# Patient Record
Sex: Female | Born: 1965 | Hispanic: Yes | Smoking: Never smoker
Health system: Southern US, Community
[De-identification: ages and names within clinical notes are randomized; demographics above are authoritative.]

## PROBLEM LIST (undated history)

## (undated) DIAGNOSIS — Z87442 Personal history of urinary calculi: Secondary | ICD-10-CM

## (undated) DIAGNOSIS — E079 Disorder of thyroid, unspecified: Secondary | ICD-10-CM

## (undated) DIAGNOSIS — D649 Anemia, unspecified: Secondary | ICD-10-CM

## (undated) DIAGNOSIS — E039 Hypothyroidism, unspecified: Secondary | ICD-10-CM

---

## 2014-03-06 ENCOUNTER — Ambulatory Visit: Payer: Self-pay

## 2016-03-15 ENCOUNTER — Emergency Department
Admission: EM | Admit: 2016-03-15 | Discharge: 2016-03-15 | Disposition: A | Discharge status: Home / Self Care | Payer: Self-pay | Attending: Student | Admitting: Student

## 2016-03-15 DIAGNOSIS — K029 Dental caries, unspecified: Secondary | ICD-10-CM | POA: Insufficient documentation

## 2016-03-15 MED ORDER — AMOXICILLIN 500 MG PO CAPS
500.0000 mg | ORAL_CAPSULE | Freq: Once | ORAL | Status: AC
Start: 2016-03-15 — End: 2016-03-15
  Administered 2016-03-15: 500 mg via ORAL
  Filled 2016-03-15: qty 1

## 2016-03-15 MED ORDER — AMOXICILLIN 500 MG PO CAPS: 500.0000 mg | ORAL_CAPSULE | Freq: Three times a day (TID) | ORAL | Status: DC

## 2016-03-15 MED ORDER — IBUPROFEN 600 MG PO TABS: 600.0000 mg | ORAL_TABLET | Freq: Three times a day (TID) | ORAL | Status: DC | PRN

## 2016-03-15 MED ORDER — IBUPROFEN 600 MG PO TABS
600.0000 mg | ORAL_TABLET | Freq: Once | ORAL | Status: AC
  Administered 2016-03-15: 600 mg via ORAL
  Filled 2016-03-15: qty 1

## 2016-03-15 MED ORDER — LIDOCAINE VISCOUS 2 % MT SOLN
15.0000 mL | Freq: Once | OROMUCOSAL | Status: AC
  Administered 2016-03-15: 15 mL via OROMUCOSAL
  Filled 2016-03-15: qty 15

## 2016-03-15 MED ORDER — LIDOCAINE VISCOUS 2 % MT SOLN: 5.0000 mL | Freq: Four times a day (QID) | OROMUCOSAL | Status: DC | PRN

## 2016-03-15 MED ORDER — OXYCODONE-ACETAMINOPHEN 5-325 MG PO TABS
ORAL_TABLET | ORAL | Status: AC
  Administered 2016-03-15: 1 via ORAL
  Filled 2016-03-15: qty 1

## 2016-03-15 MED ORDER — TRAMADOL HCL 50 MG PO TABS: 50.0000 mg | ORAL_TABLET | Freq: Four times a day (QID) | ORAL | Status: AC | PRN

## 2016-03-15 MED ORDER — OXYCODONE-ACETAMINOPHEN 5-325 MG PO TABS
1.0000 | ORAL_TABLET | ORAL | Status: DC | PRN
  Administered 2016-03-15: 1 via ORAL

## 2016-03-15 MED ORDER — OXYCODONE-ACETAMINOPHEN 5-325 MG PO TABS
1.0000 | ORAL_TABLET | Freq: Once | ORAL | Status: AC
  Administered 2016-03-15: 1 via ORAL
  Filled 2016-03-15: qty 1

## 2016-03-15 NOTE — Discharge Instructions (Signed)
Follow-up from list dental clinics provided: [place]  [place] [facility] and [facility] [email or web address] [phone])  [facility] [phone])  [place] [phone])  [place] [phone])  [facility] [phone]) This clinic caters to the indigent population and is on a lottery system. Location: [employer] [facility], [place], [place], [place] [facility] Hours: Wednesdays from 6pm - 9pm, patients seen by a lottery system. For dates, call or go to [email or web address] Services: Cleanings, fillings and simple extractions. Payment Options: DENTAL WORK IS FREE OF CHARGE. Bring proof of income or support. Best way to get seen: Arrive at 5:15 pm - this is a lottery, NOT first come/first serve, so arriving earlier will not increase your chances of being seen.     [facility] [phone] Select option 1 for emergencies   Location: [facility], [place], [address] [place] [facility] Hours: No walk-ins accepted - call the day before to schedule an appointment. Check in times are 9:30 am and 1:30 pm. Services: Simple extractions, temporary fillings, pulpectomy/pulp debridement, uncomplicated abscess drainage. Payment Options: PAYMENT IS DUE AT THE TIME OF SERVICE.  Fee is usually $100-200, additional surgical procedures (e.g. abscess drainage) may be extra. Cash, checks, Visa/MasterCard accepted.  Can file Medicaid if patient is covered for dental - patient should call case worker to check. No discount for [facility] patients. Best way to get seen: MUST call the day before and get onto the schedule. Can usually be seen the next 1-2 days. No walk-ins accepted.     [place] [phone]   Location: [place], [place] [facility] Hours: M, W, Th, F 8am or 1:30pm, Tues 9a or 1:30 - first come/first served. Services: Simple extractions, temporary fillings, uncomplicated abscess drainage.  You do not need to be an [facility] resident. Payment Options: PAYMENT IS DUE AT THE TIME OF SERVICE. Dental insurance, otherwise sliding scale - bring proof of income or support. Depending on income and treatment needed, cost is usually $50-200. Best way to get seen: Arrive early as it is first come/first served.     [facility] [phone]   Location: [place] [facility] Hours: Mon-Thu 8a-5p Services: Most basic dental services including extractions and fillings. Payment Options: PAYMENT IS DUE AT THE TIME OF SERVICE. Sliding scale, up to 50% off - bring proof if income or support. Medicaid with dental option accepted. Best way to get seen: Call to schedule an appointment, can usually be seen within 2 weeks OR they will try to see walk-ins - show up at [place] or 2p (you may have to wait).     [facility] [place] RESIDENTS ONLY   Location: [facility], [place] [address] [place], [place] [address] Clinic Hours: By appointment only. Monday - Thursday 8am-5pm, Friday 8am-12pm Services: Cleanings, fillings, extractions. Payment Options: PAYMENT IS DUE AT THE TIME OF SERVICE. Cash, Visa or MasterCard. Sliding scale - $30 minimum per service. Best way to get seen: Come in to office, complete packet and make an appointment - need proof of income or support monies for each household member and proof of [facility] residence. Usually takes about a month to get in.     [facility] [phone]  Location: [address] [place] [facility] Hours: Walk-in Urgent Care Dental Services  are offered Monday-Friday mornings only. The numbers of emergencies accepted daily is limited to the number of providers available. Maximum 15 - Mondays, Wednesdays & Thursdays Maximum 10 - Tuesdays & Fridays Services: You do not need to be a [facility] resident to be seen for a dental emergency. Emergencies are defined as pain, swelling, abnormal bleeding, or dental trauma. Walkins will receive x-rays if needed. NOTE: Dental cleaning is not an emergency. Payment Options: PAYMENT IS DUE AT THE TIME OF SERVICE. Minimum co-pay is $40.00 for uninsured patients. Minimum co-pay is $3.00 for Medicaid with dental coverage. Dental Insurance is accepted and must be presented at time of visit. Medicare does not cover dental. Forms of payment: Cash, credit card, checks. Best way to get seen: If not previously registered with the clinic, walk-in dental registration begins at 7:15 am and is on a first come/first serve basis. If previously registered with the clinic, call to make an appointment.     [facility] [place] ONLY   Location: [address], [place], [place] Clinic Hours: Mon-Thu 10a-2p Services: Extractions only! Payment Options: FREE (donations accepted) - bring proof of income or support Best way to get seen: Call and schedule an appointment OR come at 8am on the 1st Monday of every month (except for holidays) when it is first come/first served.     [facility] [phone]   Location: [place], [place] [facility] Hours: Friday mornings Services, Payment Options, Best way to get seen: Call for info  Caries dentales (Dental Caries) Caries dentales (enfermedad en los dientes) Esta enfermedad puede originar un hueco en los dientes (carie) que puede volverse ms grande y profunda a lo largo del [place]. CUIDADOS EN EL HOGAR  [place] sus dientes y use hilo dental. Hgalo por lo [employer] al da.  Use dentfrico con  flor.  Use enjuague bucal si as se lo indica el dentista o el mdico.  Coma menos alimentos con azcar y almidn. Beba menos bebidas azucaradas.  Evite comer con frecuencia bocadillos con azcar y almidn. Evite beber con frecuencia bebidas azucaradas.  Concurra a los controles y limpiezas regulares con Brewing technologist.  Use suplementos con flor, si as se lo indica el dentista o el mdico.  Permita que le coloquen flor en los dientes si as se lo indica el dentista o el mdico.   Esta informacin no tiene Marine scientist el consejo del mdico. Asegrese de hacerle al mdico cualquier pregunta que tenga.   Document Released: 06/15/2013 Document Revised: 09/15/2014 Elsevier Interactive Patient Education [employer].

## 2016-03-15 NOTE — ED Provider Notes (Signed)
[facility] Emergency Department Provider Note   ____________________________________________  Time seen: Approximately 9:06 PM  I have reviewed the triage vital signs and the nursing notes.   HISTORY  Chief Complaint Facial Pain    HPI Debra [name] is a 50 y.o. female patient complaining of dental pain. Patient state 2 weeks ago she was seen at [facility] and given antibiotics and told to follow-up with dentist. Patient has not follow-up with the dentist as directed. Patient state the pain is worse. Patient is rating the pain as 8/10. Patient described a pain as "sharp". No other palliative measures for this complaint.   No past medical history on file.  There are no active problems to display for this patient.   No past surgical history on file.  Current Outpatient Rx  Name  Route  Sig  Dispense  Refill  . amoxicillin (AMOXIL) 500 MG capsule   Oral   Take 1 capsule (500 mg total) by mouth 3 (three) times daily.   30 capsule   0   . ibuprofen (ADVIL,MOTRIN) 600 MG tablet   Oral   Take 1 tablet (600 mg total) by mouth every 8 (eight) hours as needed.   15 tablet   0   . lidocaine (XYLOCAINE) 2 % solution   Mouth/Throat   Use as directed 5 mLs in the mouth or throat every 6 (six) hours as needed for mouth pain.   100 mL   0   . traMADol (ULTRAM) 50 MG tablet   Oral   Take 1 tablet (50 mg total) by mouth every 6 (six) hours as needed.   20 tablet   0     Allergies Review of patient's allergies indicates no known allergies.  No family history on file.  Social History Social History  Substance Use Topics  . Smoking status: Not on file  . Smokeless tobacco: Not on file  . Alcohol Use: Not on file    Review of Systems Constitutional: No fever/chills Eyes: No visual changes. ENT: No sore throat.Dental pain Cardiovascular: Denies chest pain. Respiratory: Denies shortness of breath. Gastrointestinal: No abdominal  pain.  No nausea, no vomiting.  No diarrhea.  No constipation. Genitourinary: Negative for dysuria. Musculoskeletal: Negative for back pain. Skin: Negative for rash. Neurological: Negative for headaches, focal weakness or numbness.    ____________________________________________   PHYSICAL EXAM:  VITAL SIGNS: ED Triage Vitals  Enc Vitals Group     BP 03/15/16 2025 113/90 mmHg     Pulse Rate 03/15/16 2025 72     Resp 03/15/16 2025 18     Temp 03/15/16 2025 97.7 F (36.5 C)     Temp Source 03/15/16 2025 Oral     SpO2 03/15/16 2025 100 %     Weight 03/15/16 2025 215 lb (97.523 kg)     Height 03/15/16 2025 5\' 1"  (1.549 m)     Head Cir --      Peak Flow --      Pain Score 03/15/16 2027 8     Pain Loc --      Pain Edu? --      Excl. in [place]? --     Constitutional: Alert and oriented. Well appearing and in no acute distress. Eyes: Conjunctivae are normal. PERRL. EOMI. Head: Atraumatic. Nose: No congestion/rhinnorhea. Mouth/Throat: Mucous membranes are moist.  Oropharynx non-erythematous.Multiple devitalized teeth with edematous and erythematous gingiva. Neck: No stridor.  No cervical spine tenderness to palpation. Hematological/Lymphatic/Immunilogical: No cervical  lymphadenopathy. Cardiovascular: Normal rate, regular rhythm. Grossly normal heart sounds.  Good peripheral circulation. Respiratory: Normal respiratory effort.  No retractions. Lungs CTAB. Gastrointestinal: Soft and nontender. No distention. No abdominal bruits. No CVA tenderness. Musculoskeletal: No lower extremity tenderness nor edema.  No joint effusions. Neurologic:  Normal speech and language. No gross focal neurologic deficits are appreciated. No gait instability. Skin:  Skin is warm, dry and intact. No rash noted. Psychiatric: Mood and affect are normal. Speech and behavior are normal.  ____________________________________________   LABS (all labs ordered are listed, but only abnormal results are  displayed)  Labs Reviewed - No data to display ____________________________________________  EKG   ____________________________________________  RADIOLOGY   ____________________________________________   PROCEDURES  Procedure(s) performed: None  Procedures  Critical Care performed: No  ____________________________________________   INITIAL IMPRESSION / ASSESSMENT AND PLAN / ED COURSE  Pertinent labs & imaging results that were available during my care of the patient were reviewed by me and considered in my medical decision making (see chart for details).  Dental pain secondary to devitalized teeth and gingivitis. Advised patient definitive care must be provided by dentist. Patient given discharge care instructions. Patient given a prescription for amoxicillin, ibuprofen, tramadol, viscous lidocaine. ____________________________________________   FINAL CLINICAL IMPRESSION(S) / ED DIAGNOSES  Final diagnoses:  Pain due to dental caries      NEW MEDICATIONS STARTED DURING THIS VISIT:  New Prescriptions   AMOXICILLIN (AMOXIL) 500 MG CAPSULE    Take 1 capsule (500 mg total) by mouth 3 (three) times daily.   IBUPROFEN (ADVIL,MOTRIN) 600 MG TABLET    Take 1 tablet (600 mg total) by mouth every 8 (eight) hours as needed.   LIDOCAINE (XYLOCAINE) 2 % SOLUTION    Use as directed 5 mLs in the mouth or throat every 6 (six) hours as needed for mouth pain.   TRAMADOL (ULTRAM) 50 MG TABLET    Take 1 tablet (50 mg total) by mouth every 6 (six) hours as needed.     Note:  This document was prepared using Dragon voice recognition software and may include unintentional dictation errors.    Sable Feil, PA-C 03/15/16 2115  Joanne Gavel, MD 03/17/16 0000

## 2016-03-15 NOTE — ED Notes (Signed)
Pt ambulatory to triage with no difficulty. Via [facility] interpreter Debra [name] pt reports pain to her right side of face that radiates up the side of her face. Pt reports she has had this pain intermittently for some time and pt reports she was seen at [facility] about 2 weeks ago and was put on a medication for infection and told to follow up with a dentist if the pain did not improve. Today the pain is worse. Pt tearful during triage.

## 2016-07-21 ENCOUNTER — Emergency Department
Admission: EM | Admit: 2016-07-21 | Discharge: 2016-07-21 | Disposition: A | Discharge status: Home / Self Care | Payer: Self-pay | Attending: Emergency Medicine | Admitting: Emergency Medicine

## 2016-07-21 DIAGNOSIS — Z79899 Other long term (current) drug therapy: Secondary | ICD-10-CM | POA: Insufficient documentation

## 2016-07-21 DIAGNOSIS — L0291 Cutaneous abscess, unspecified: Secondary | ICD-10-CM

## 2016-07-21 DIAGNOSIS — L02414 Cutaneous abscess of left upper limb: Secondary | ICD-10-CM | POA: Insufficient documentation

## 2016-07-21 MED ORDER — SULFAMETHOXAZOLE-TRIMETHOPRIM 800-160 MG PO TABS: 1.0000 | ORAL_TABLET | Freq: Two times a day (BID) | ORAL | 0 refills | Status: DC

## 2016-07-21 MED ORDER — HYDROCODONE-ACETAMINOPHEN 5-325 MG PO TABS: 1.0000 | ORAL_TABLET | ORAL | 0 refills | Status: DC | PRN

## 2016-07-21 NOTE — ED Triage Notes (Signed)
Pt presents to ED with reports of rash on back for four days. Pt reports rash itches.

## 2016-07-21 NOTE — Discharge Instructions (Signed)
Follow-up with your primary care doctor if any continued problems. Begin taking antibiotics as directed. Pain medication and is to be taken as needed for pain. Beware that you  cannot drive while taking this medication as we have already discussed.

## 2016-07-21 NOTE — ED Provider Notes (Signed)
[facility] Emergency Department Provider Note   ____________________________________________   First MD Initiated Contact with Patient 07/21/16 1149     (approximate)  I have reviewed the triage vital signs and the nursing notes.   HISTORY  Chief Complaint Rash Per interpreter   HPI Debra [name] is a 50 y.o. female is here with family with complaint of "rash" on her back. Patient states that it initially started out as an itching place that she was scratching she said now it is more painful. She denies any fever or chills. She has had these symptoms for the last 4 days. Patient is unsure whether she was bitten by a insect. At this time pain is 7 out of 10.  No past medical history on file.  There are no active problems to display for this patient.   No past surgical history on file.  Prior to Admission medications   Medication Sig Start Date End Date Taking? Authorizing Provider  amoxicillin (AMOXIL) 500 MG capsule Take 1 capsule (500 mg total) by mouth 3 (three) times daily. 03/15/16   Sable Feil, PA-C  HYDROcodone-acetaminophen (NORCO/VICODIN) 5-325 MG tablet Take 1 tablet by mouth every 4 (four) hours as needed for moderate pain. 07/21/16   Johnn Hai, PA-C  ibuprofen (ADVIL,MOTRIN) 600 MG tablet Take 1 tablet (600 mg total) by mouth every 8 (eight) hours as needed. 03/15/16   Sable Feil, PA-C  lidocaine (XYLOCAINE) 2 % solution Use as directed 5 mLs in the mouth or throat every 6 (six) hours as needed for mouth pain. 03/15/16   Sable Feil, PA-C  sulfamethoxazole-trimethoprim (BACTRIM DS,SEPTRA DS) 800-160 MG tablet Take 1 tablet by mouth 2 (two) times daily. 07/21/16   Johnn Hai, PA-C  traMADol (ULTRAM) 50 MG tablet Take 1 tablet (50 mg total) by mouth every 6 (six) hours as needed. 03/15/16 03/15/17  Sable Feil, PA-C    Allergies Patient has no known allergies.  No family history on file.  Social History Social  History  Substance Use Topics  . Smoking status: Not on file  . Smokeless tobacco: Not on file  . Alcohol use Not on file    Review of Systems Constitutional: No fever/chills Cardiovascular: Denies chest pain. Respiratory: Denies shortness of breath. Gastrointestinal:   No nausea, no vomiting.  Musculoskeletal: Negative for back pain. Skin: Positive for red area left shoulder. Neurological: Negative for headaches, focal weakness or numbness.  10-point ROS otherwise negative.  ____________________________________________   PHYSICAL EXAM:  VITAL SIGNS: ED Triage Vitals  Enc Vitals Group     BP 07/21/16 1130 134/71     Pulse Rate 07/21/16 1130 76     Resp 07/21/16 1130 18     Temp 07/21/16 1130 97.6 F (36.4 C)     Temp Source 07/21/16 1130 Oral     SpO2 07/21/16 1130 100 %     Weight 07/21/16 1131 250 lb (113.4 kg)     Height 07/21/16 1131 5\' 2"  (1.575 m)     Head Circumference --      Peak Flow --      Pain Score 07/21/16 1131 7     Pain Loc --      Pain Edu? --      Excl. in [place]? --     Constitutional: Alert and oriented. Well appearing and in no acute distress. Eyes: Conjunctivae are normal. PERRL. EOMI. Head: Atraumatic. Nose: No congestion/rhinnorhea. Neck: No stridor.  Cardiovascular: Normal rate, regular rhythm. Grossly normal heart sounds.  Good peripheral circulation. Respiratory: Normal respiratory effort.  No retractions. Lungs CTAB. Musculoskeletal: Moves upper and lower extremities without any difficulty. Normal gait was noted. Neurologic:  Normal speech and language. No gross focal neurologic deficits are appreciated. No gait instability. Skin:  Left posterior shoulder there is a 4 cm area that is erythematous and warm. There is moderate tenderness to touch. Area is firm and no fluctuance was noted. No drainage from this area. Psychiatric: Mood and affect are normal. Speech and behavior are normal.  ____________________________________________     LABS (all labs ordered are listed, but only abnormal results are displayed)  Labs Reviewed - No data to display   PROCEDURES  Procedure(s) performed: None  Procedures  Critical Care performed: No  ____________________________________________   INITIAL IMPRESSION / ASSESSMENT AND PLAN / ED COURSE  Pertinent labs & imaging results that were available during my care of the patient were reviewed by me and considered in my medical decision making (see chart for details).    Clinical Course    Interpreter was called and we discussed the the abscess to the patient and what she should be doing. Patient was given a prescription for Bactrim DS twice a day for 10 days and Norco as needed for pain. Patient is to use warm compresses to the area frequently. She will follow-up with her primary care doctor if any continued problems or return to the emergency room should the area become soft and mushy like we discussed for incision and drainage.  ____________________________________________   FINAL CLINICAL IMPRESSION(S) / ED DIAGNOSES  Final diagnoses:  Abscess      NEW MEDICATIONS STARTED DURING THIS VISIT:  Discharge Medication List as of 07/21/2016 12:20 PM    START taking these medications   Details  HYDROcodone-acetaminophen (NORCO/VICODIN) 5-325 MG tablet Take 1 tablet by mouth every 4 (four) hours as needed for moderate pain., Starting Mon 07/21/2016, Print    sulfamethoxazole-trimethoprim (BACTRIM DS,SEPTRA DS) 800-160 MG tablet Take 1 tablet by mouth 2 (two) times daily., Starting Mon 07/21/2016, Print         Note:  This document was prepared using Dragon voice recognition software and may include unintentional dictation errors.    Johnn Hai, PA-C 07/21/16 [place], MD 07/22/16 2216

## 2017-09-30 ENCOUNTER — Ambulatory Visit: Payer: Self-pay | Attending: Oncology

## 2018-07-28 ENCOUNTER — Other Ambulatory Visit: Payer: Self-pay

## 2018-07-28 ENCOUNTER — Encounter (INDEPENDENT_AMBULATORY_CARE_PROVIDER_SITE_OTHER): Payer: Self-pay

## 2018-07-28 ENCOUNTER — Ambulatory Visit: Payer: Self-pay | Attending: Oncology | Admitting: *Deleted

## 2018-07-28 ENCOUNTER — Encounter: Payer: Self-pay | Admitting: *Deleted

## 2018-07-28 VITALS — BP 113/78 | HR 59 | Temp 98.3°F | Ht 61.0 in | Wt 218.0 lb

## 2018-07-28 DIAGNOSIS — Z0000 Encounter for general adult medical examination without abnormal findings: Secondary | ICD-10-CM

## 2018-07-28 DIAGNOSIS — N63 Unspecified lump in unspecified breast: Secondary | ICD-10-CM

## 2018-07-28 NOTE — Patient Instructions (Signed)
Gave patient hand-out, Women Staying Healthy, Active and Well from [facility], with education on breast health, pap smears, heart and colon health. 

## 2018-07-28 NOTE — Progress Notes (Signed)
  Subjective:     Patient ID: Debra [name], female   DOB: Jan 18, 1966, 52 y.o.   MRN: [id number]  HPI   Review of Systems     Objective:   Physical Exam  Pulmonary/Chest: Right breast exhibits skin change. Right breast exhibits no inverted nipple, no mass, no nipple discharge and no tenderness. Left breast exhibits no inverted nipple, no mass, no nipple discharge, no skin change and no tenderness.  Right breast larger than the left    Abdominal: There is no splenomegaly or hepatomegaly.  Genitourinary: There is no rash, tenderness, lesion or injury on the right labia. There is no rash, tenderness, lesion or injury on the left labia. Cervix exhibits no motion tenderness, no discharge and no friability. Right adnexum displays no mass, no tenderness and no fullness. No erythema, tenderness or bleeding in the vagina. No foreign body in the vagina. No signs of injury around the vagina. No vaginal discharge found.    Skin:     Patient with looks like vitiligo over her entire body       Assessment:     52 year old Hispanic female returns to [facility] for annual screening.  Debra [name], the interpreter present during the interview and exam.  Patient is a very poor historian and getting info regarding her history was very difficult.  On clinical breast exam patient has an approximate 3 cm area of redness with a center superficial nodule.  Patient states it has been present for over a year.  States she also has a place on her back.  There is an approximate 1 cm red flat superficial nodule at the upper left back.  Patient states she went to the ED for this nodule because it was painful, but has never had the breast area of redness evaluated.  Patient has what looks like vitiligo over her entire body.  Taught self breast awareness.  Specimen collected for pap smear without difficulty.  Patient has been screened for eligibility.  She does not have any insurance, Medicare or Medicaid.  She also meets  financial eligibility.  Hand-out given on the Affordable Care Act.  Risk Assessment    Risk Scores      07/28/2018   Last edited by: [name], RN   5-year risk: 1 %   Lifetime risk: 7.3 %            Plan:      Will go ahead and get bilateral diagnostic mammogram and ultrasound, although I feel the area of redness is more skin related.  I have encouraged the patient to follow-up with primary care in regards to the red area on her breast if no findings on imaging. Specimen for pap sent to the lab.  Will follow-up per [facility] protocol.

## 2018-07-31 LAB — PAP LB AND HPV HIGH-RISK: HPV, high-risk: NEGATIVE

## 2018-08-17 ENCOUNTER — Encounter: Payer: Self-pay | Admitting: *Deleted

## 2018-08-17 NOTE — Progress Notes (Signed)
Inbasket message sent to Jeanella Anton to schedule the patient's mammogram.  She did not get to the breast center in time on the day of her appointment.

## 2018-09-10 ENCOUNTER — Ambulatory Visit
Admission: RE | Admit: 2018-09-10 | Discharge: 2018-09-10 | Disposition: A | Discharge status: Home / Self Care | Payer: Self-pay | Source: Ambulatory Visit | Attending: Oncology | Admitting: Oncology

## 2018-09-10 DIAGNOSIS — N63 Unspecified lump in unspecified breast: Secondary | ICD-10-CM | POA: Insufficient documentation

## 2018-09-13 ENCOUNTER — Encounter: Payer: Self-pay | Admitting: *Deleted

## 2018-09-13 NOTE — Progress Notes (Signed)
Patient with benign findings on imaging.  She was encouraged by radiology to follow up with a dermatology consult.  Next mammogram in 1 year.  Pap smear was negative / negative.  Next pap in 5 years.  Letter mailed to inform patient of her results.  HSIS to [place].

## 2019-11-20 ENCOUNTER — Emergency Department: Payer: Self-pay

## 2019-11-20 ENCOUNTER — Other Ambulatory Visit: Payer: Self-pay

## 2019-11-20 ENCOUNTER — Inpatient Hospital Stay
Admission: EM | Admit: 2019-11-20 | Discharge: 2019-11-22 | DRG: 388 | Disposition: A | Discharge status: Home / Self Care | Payer: Self-pay | Attending: Internal Medicine | Admitting: Internal Medicine

## 2019-11-20 ENCOUNTER — Encounter: Payer: Self-pay | Admitting: Emergency Medicine

## 2019-11-20 DIAGNOSIS — D509 Iron deficiency anemia, unspecified: Secondary | ICD-10-CM | POA: Diagnosis present

## 2019-11-20 DIAGNOSIS — K56699 Other intestinal obstruction unspecified as to partial versus complete obstruction: Principal | ICD-10-CM | POA: Diagnosis present

## 2019-11-20 DIAGNOSIS — Z791 Long term (current) use of non-steroidal anti-inflammatories (NSAID): Secondary | ICD-10-CM

## 2019-11-20 DIAGNOSIS — K317 Polyp of stomach and duodenum: Secondary | ICD-10-CM | POA: Diagnosis present

## 2019-11-20 DIAGNOSIS — Z79890 Hormone replacement therapy (postmenopausal): Secondary | ICD-10-CM

## 2019-11-20 DIAGNOSIS — K644 Residual hemorrhoidal skin tags: Secondary | ICD-10-CM | POA: Diagnosis present

## 2019-11-20 DIAGNOSIS — K6389 Other specified diseases of intestine: Secondary | ICD-10-CM

## 2019-11-20 DIAGNOSIS — U071 COVID-19: Secondary | ICD-10-CM | POA: Diagnosis present

## 2019-11-20 DIAGNOSIS — D649 Anemia, unspecified: Secondary | ICD-10-CM | POA: Diagnosis present

## 2019-11-20 DIAGNOSIS — Z6841 Body Mass Index (BMI) 40.0 and over, adult: Secondary | ICD-10-CM

## 2019-11-20 DIAGNOSIS — D500 Iron deficiency anemia secondary to blood loss (chronic): Secondary | ICD-10-CM | POA: Diagnosis present

## 2019-11-20 DIAGNOSIS — Z79899 Other long term (current) drug therapy: Secondary | ICD-10-CM

## 2019-11-20 DIAGNOSIS — Z20822 Contact with and (suspected) exposure to covid-19: Secondary | ICD-10-CM | POA: Diagnosis present

## 2019-11-20 DIAGNOSIS — E039 Hypothyroidism, unspecified: Secondary | ICD-10-CM | POA: Diagnosis present

## 2019-11-20 DIAGNOSIS — K922 Gastrointestinal hemorrhage, unspecified: Secondary | ICD-10-CM | POA: Diagnosis present

## 2019-11-20 HISTORY — DX: Disorder of thyroid, unspecified: E07.9

## 2019-11-20 HISTORY — DX: Hypothyroidism, unspecified: E03.9

## 2019-11-20 LAB — IRON AND TIBC
Iron: 13 ug/dL — ABNORMAL LOW (ref 28–170)
Saturation Ratios: 4 % — ABNORMAL LOW (ref 10.4–31.8)
TIBC: 374 ug/dL (ref 250–450)
UIBC: 361 ug/dL

## 2019-11-20 LAB — COMPREHENSIVE METABOLIC PANEL
ALT: 15 U/L (ref 0–44)
AST: 27 U/L (ref 15–41)
Albumin: 3.5 g/dL (ref 3.5–5.0)
Alkaline Phosphatase: 69 U/L (ref 38–126)
Anion gap: 8 (ref 5–15)
BUN: 9 mg/dL (ref 6–20)
CO2: 20 mmol/L — ABNORMAL LOW (ref 22–32)
Calcium: 7.7 mg/dL — ABNORMAL LOW (ref 8.9–10.3)
Chloride: 103 mmol/L (ref 98–111)
Creatinine, Ser: 0.59 mg/dL (ref 0.44–1.00)
GFR calc Af Amer: >60 mL/min (ref >60)
GFR calc non Af Amer: >60 mL/min (ref >60)
Glucose, Bld: 156 mg/dL — ABNORMAL HIGH (ref 70–99)
Potassium: 3.5 mmol/L (ref 3.5–5.1)
Sodium: 131 mmol/L — ABNORMAL LOW (ref 135–145)
Total Bilirubin: 0.5 mg/dL (ref 0.3–1.2)
Total Protein: 6.5 g/dL (ref 6.5–8.1)

## 2019-11-20 LAB — CBC WITH DIFFERENTIAL/PLATELET
Abs Immature Granulocytes: 0.01 10*3/uL (ref 0.00–0.07)
Basophils Absolute: 0 10*3/uL (ref 0.0–0.1)
Basophils Relative: 0 %
Eosinophils Absolute: 0 10*3/uL (ref 0.0–0.5)
Eosinophils Relative: 0 %
HCT: 24.1 % — ABNORMAL LOW (ref 36.0–46.0)
Hemoglobin: 6.8 g/dL — ABNORMAL LOW (ref 12.0–15.0)
Immature Granulocytes: 0 %
Lymphocytes Relative: 24 %
Lymphs Abs: 1 10*3/uL (ref 0.7–4.0)
MCH: 17.3 pg — ABNORMAL LOW (ref 26.0–34.0)
MCHC: 28.2 g/dL — ABNORMAL LOW (ref 30.0–36.0)
MCV: 61.2 fL — ABNORMAL LOW (ref 80.0–100.0)
Monocytes Absolute: 0.4 10*3/uL (ref 0.1–1.0)
Monocytes Relative: 10 %
Neutro Abs: 2.7 10*3/uL (ref 1.7–7.7)
Neutrophils Relative %: 66 %
Platelets: 197 10*3/uL (ref 150–400)
RBC: 3.94 MIL/uL (ref 3.87–5.11)
RDW: 19.9 % — ABNORMAL HIGH (ref 11.5–15.5)
WBC: 4 10*3/uL (ref 4.0–10.5)
nRBC: 0 % (ref 0.0–0.2)

## 2019-11-20 LAB — CBC
HCT: 23.6 % — ABNORMAL LOW (ref 36.0–46.0)
HCT: 28.7 % — ABNORMAL LOW (ref 36.0–46.0)
Hemoglobin: 6.6 g/dL — ABNORMAL LOW (ref 12.0–15.0)
Hemoglobin: 8.2 g/dL — ABNORMAL LOW (ref 12.0–15.0)
MCH: 17.3 pg — ABNORMAL LOW (ref 26.0–34.0)
MCH: 18.8 pg — ABNORMAL LOW (ref 26.0–34.0)
MCHC: 28 g/dL — ABNORMAL LOW (ref 30.0–36.0)
MCHC: 28.6 g/dL — ABNORMAL LOW (ref 30.0–36.0)
MCV: 61.9 fL — ABNORMAL LOW (ref 80.0–100.0)
MCV: 65.7 fL — ABNORMAL LOW (ref 80.0–100.0)
Platelets: 184 10*3/uL (ref 150–400)
Platelets: 155 10*3/uL (ref 150–400)
RBC: 3.81 MIL/uL — ABNORMAL LOW (ref 3.87–5.11)
RBC: 4.37 MIL/uL (ref 3.87–5.11)
RDW: 19.6 % — ABNORMAL HIGH (ref 11.5–15.5)
RDW: 23 % — ABNORMAL HIGH (ref 11.5–15.5)
WBC: 3.6 10*3/uL — ABNORMAL LOW (ref 4.0–10.5)
WBC: 3.2 10*3/uL — ABNORMAL LOW (ref 4.0–10.5)
nRBC: 0 % (ref 0.0–0.2)
nRBC: 0 % (ref 0.0–0.2)

## 2019-11-20 LAB — URINALYSIS, ROUTINE W REFLEX MICROSCOPIC
Bacteria, UA: NONE SEEN
Bilirubin Urine: NEGATIVE
Glucose, UA: NEGATIVE mg/dL
Hgb urine dipstick: NEGATIVE
Ketones, ur: NEGATIVE mg/dL
Leukocytes,Ua: NEGATIVE
Nitrite: NEGATIVE
Protein, ur: 30 mg/dL — AB
Specific Gravity, Urine: 1.012 (ref 1.005–1.030)
pH: 7 (ref 5.0–8.0)

## 2019-11-20 LAB — PROTIME-INR
INR: 1.2 (ref 0.8–1.2)
Prothrombin Time: 15.4 seconds — ABNORMAL HIGH (ref 11.4–15.2)

## 2019-11-20 LAB — ABO/RH: ABO/RH(D): O POS

## 2019-11-20 LAB — PREPARE RBC (CROSSMATCH)

## 2019-11-20 LAB — RETICULOCYTES
Immature Retic Fract: 6.7 % (ref 2.3–15.9)
RBC.: 3.49 MIL/uL — ABNORMAL LOW (ref 3.87–5.11)
Retic Count, Absolute: 27.6 10*3/uL (ref 19.0–186.0)
Retic Ct Pct: 0.8 % (ref 0.4–3.1)

## 2019-11-20 LAB — APTT: aPTT: 33 seconds (ref 24–36)

## 2019-11-20 LAB — MAGNESIUM: Magnesium: 2 mg/dL (ref 1.7–2.4)

## 2019-11-20 LAB — FERRITIN: Ferritin: 6 ng/mL — ABNORMAL LOW (ref 11–307)

## 2019-11-20 LAB — POC SARS CORONAVIRUS 2 AG: SARS Coronavirus 2 Ag: POSITIVE — AB

## 2019-11-20 MED ORDER — SODIUM CHLORIDE 0.9 % IV SOLN
510.0000 mg | Freq: Once | INTRAVENOUS | Status: AC
  Administered 2019-11-20: 510 mg via INTRAVENOUS
  Filled 2019-11-20: qty 17

## 2019-11-20 MED ORDER — ONDANSETRON HCL 4 MG/2ML IJ SOLN: 4.0000 mg | Freq: Four times a day (QID) | INTRAMUSCULAR | Status: DC | PRN

## 2019-11-20 MED ORDER — ONDANSETRON HCL 4 MG/2ML IJ SOLN
4.0000 mg | Freq: Once | INTRAMUSCULAR | Status: AC
  Administered 2019-11-20: 09:00:00 4 mg via INTRAVENOUS
  Filled 2019-11-20: qty 2

## 2019-11-20 MED ORDER — SODIUM CHLORIDE 0.9 % IV SOLN: INTRAVENOUS | Status: DC

## 2019-11-20 MED ORDER — ACETAMINOPHEN 650 MG RE SUPP: 650.0000 mg | Freq: Four times a day (QID) | RECTAL | Status: DC | PRN

## 2019-11-20 MED ORDER — LEVOTHYROXINE SODIUM 50 MCG PO TABS
150.0000 ug | ORAL_TABLET | Freq: Every day | ORAL | Status: DC
  Administered 2019-11-21 – 2019-11-22 (×2): 150 ug via ORAL
  Filled 2019-11-20 (×2): qty 1

## 2019-11-20 MED ORDER — PEG 3350-KCL-NA BICARB-NACL 420 G PO SOLR
4000.0000 mL | Freq: Once | ORAL | Status: AC
  Administered 2019-11-21: 01:00:00 4000 mL via ORAL
  Filled 2019-11-20: qty 4000

## 2019-11-20 MED ORDER — SENNOSIDES-DOCUSATE SODIUM 8.6-50 MG PO TABS: 1.0000 | ORAL_TABLET | Freq: Every evening | ORAL | Status: DC | PRN

## 2019-11-20 MED ORDER — SODIUM CHLORIDE 0.9 % IV BOLUS
1000.0000 mL | Freq: Once | INTRAVENOUS | Status: AC
  Administered 2019-11-20: 1000 mL via INTRAVENOUS

## 2019-11-20 MED ORDER — ONDANSETRON HCL 4 MG/2ML IJ SOLN: 4.0000 mg | Freq: Three times a day (TID) | INTRAMUSCULAR | Status: DC | PRN

## 2019-11-20 MED ORDER — SODIUM CHLORIDE 0.9 % IV SOLN
10.0000 mL/h | Freq: Once | INTRAVENOUS | Status: AC
  Administered 2019-11-20: 10 mL/h via INTRAVENOUS

## 2019-11-20 MED ORDER — ACETAMINOPHEN 325 MG PO TABS: 650.0000 mg | ORAL_TABLET | Freq: Four times a day (QID) | ORAL | Status: DC | PRN

## 2019-11-20 MED ORDER — SODIUM CHLORIDE 0.9 % IV SOLN
8.0000 mg/h | INTRAVENOUS | Status: DC
  Administered 2019-11-20 – 2019-11-22 (×3): 8 mg/h via INTRAVENOUS
  Filled 2019-11-20 (×3): qty 80

## 2019-11-20 MED ORDER — FERROUS SULFATE 325 (65 FE) MG PO TABS
325.0000 mg | ORAL_TABLET | Freq: Two times a day (BID) | ORAL | Status: DC
  Administered 2019-11-22: 325 mg via ORAL
  Filled 2019-11-20 (×4): qty 1

## 2019-11-20 MED ORDER — PANTOPRAZOLE SODIUM 40 MG IV SOLR: 40.0000 mg | Freq: Two times a day (BID) | INTRAVENOUS | Status: DC

## 2019-11-20 MED ORDER — SODIUM CHLORIDE 0.9 % IV SOLN
80.0000 mg | Freq: Once | INTRAVENOUS | Status: AC
  Administered 2019-11-20: 80 mg via INTRAVENOUS
  Filled 2019-11-20: qty 80

## 2019-11-20 MED ORDER — ONDANSETRON HCL 4 MG PO TABS: 4.0000 mg | ORAL_TABLET | Freq: Four times a day (QID) | ORAL | Status: DC | PRN

## 2019-11-20 MED ORDER — ASCORBIC ACID 500 MG PO TABS
500.0000 mg | ORAL_TABLET | Freq: Every day | ORAL | Status: DC
  Administered 2019-11-22: 500 mg via ORAL
  Filled 2019-11-20 (×2): qty 1

## 2019-11-20 MED ORDER — ZINC SULFATE 220 (50 ZN) MG PO CAPS
220.0000 mg | ORAL_CAPSULE | Freq: Every day | ORAL | Status: DC
  Administered 2019-11-22: 08:00:00 220 mg via ORAL
  Filled 2019-11-20 (×2): qty 1

## 2019-11-20 NOTE — ED Notes (Signed)
X-ray at bedside

## 2019-11-20 NOTE — ED Notes (Signed)
Admitting MD at bedside.

## 2019-11-20 NOTE — ED Notes (Signed)
Pt ambulatory to toilet with steady gait noted.  

## 2019-11-20 NOTE — ED Provider Notes (Signed)
[facility] Emergency Department Provider Note  ____________________________________________   First MD Initiated Contact with Patient 11/20/19 [phone]     (approximate)  I have reviewed the triage vital signs and the nursing notes.   HISTORY  Chief Complaint Chills, Headache, Fever, and Weakness    HPI Debra [name] is a 54 y.o. female with thyroid disease who comes in with not feeling well for the past 2 days.  Patient states that she developed chills and stated she felt like she was about to pass out but went straight some water and felt better.  She never lost consciousness.  Never hit her head.  She states that she just been feeling unwell.  Last took ibuprofen around 4:30 AM with some relief in symptoms, nothing seems to make it worse, constant, onset 2 days ago.  Patient endorses some mild headache but no neck stiffness, some generalized weakness, feeling nausea but no vomiting.  Some occasional abdominal tenderness but none currently.  She denies any chest pain or shortness of breath or coughing.  States that she has lost the sense of taste.  She has been around anybody with coronavirus but states that she has never had it before.  Licensed Spanish interpreter was used for history and physical and any updates for patient          Past Medical History:  Diagnosis Date  . Thyroid disease     There are no problems to display for this patient.   History reviewed. No pertinent surgical history.  Prior to Admission medications   Medication Sig Start Date End Date Taking? Authorizing Provider  Bismuth Subsalicylate (PEPTO-BISMOL PO) Take by mouth as needed.   Yes [provider]  ibuprofen (ADVIL) 200 MG tablet Take 200 mg by mouth every 6 (six) hours as needed.   Yes [provider]  levothyroxine (SYNTHROID) 150 MCG tablet Take 150 mcg by mouth daily before breakfast.   Yes [provider]    Allergies Patient  has no known allergies.  No family history on file.  Social History Social History   Tobacco Use  . Smoking status: Never Smoker  . Smokeless tobacco: Never Used  Substance Use Topics  . Alcohol use: Not on file  . Drug use: Not on file      Review of Systems Constitutional: Positive chills and fevers, body pain Eyes: No visual changes. ENT: No sore throat. Cardiovascular: Denies chest pain. Respiratory: Denies shortness of breath. Gastrointestinal: Positive occasional abdominal pain, nausea no diarrhea.  No constipation. Genitourinary: Negative for dysuria. Musculoskeletal: Negative for back pain. Skin: Negative for rash. Neurological: Positive headache, no focal weakness or numbness. All other ROS negative ____________________________________________   PHYSICAL EXAM:  VITAL SIGNS: ED Triage Vitals  Enc Vitals Group     BP 11/20/19 0828 (!) 132/46     Pulse Rate 11/20/19 0828 87     Resp 11/20/19 0828 18     Temp 11/20/19 0828 98.7 F (37.1 C)     Temp Source 11/20/19 0828 Oral     SpO2 11/20/19 0828 99 %     Weight 11/20/19 0830 215 lb (97.5 kg)     Height 11/20/19 0830 5' (1.524 m)     Head Circumference --      Peak Flow --      Pain Score 11/20/19 0830 9     Pain Loc --      Pain Edu? --      Excl. in  GC? --     Constitutional: Alert and oriented. Well appearing and in no acute distress. Eyes: Conjunctivae are normal. EOMI. Head: Atraumatic. Nose: No congestion/rhinnorhea. Mouth/Throat: Mucous membranes are moist.   Neck: No stridor. Trachea Midline. FROM Cardiovascular: Normal rate, regular rhythm. Grossly normal heart sounds.  Good peripheral circulation. Respiratory: Normal respiratory effort.  No retractions. Lungs CTAB. Gastrointestinal: Soft and nontender. No distention. No abdominal bruits.  Musculoskeletal: No lower extremity tenderness nor edema.  No joint effusions. Neurologic:  Normal speech and language. No gross focal neurologic  deficits are appreciated.  Skin:  Skin is warm, dry and intact. No rash noted. Psychiatric: Mood and affect are normal. Speech and behavior are normal. GU: Brown stool Hemoccult positive  ____________________________________________   LABS (all labs ordered are listed, but only abnormal results are displayed)  Labs Reviewed  CBC WITH DIFFERENTIAL/PLATELET - Abnormal; Notable for the following components:      Result Value   Hemoglobin 6.8 (*)    HCT 24.1 (*)    MCV 61.2 (*)    MCH 17.3 (*)    MCHC 28.2 (*)    RDW 19.9 (*)    All other components within normal limits  COMPREHENSIVE METABOLIC PANEL - Abnormal; Notable for the following components:   Sodium 131 (*)    CO2 20 (*)    Glucose, Bld 156 (*)    Calcium 7.7 (*)    All other components within normal limits  URINALYSIS, ROUTINE W REFLEX MICROSCOPIC - Abnormal; Notable for the following components:   Color, Urine YELLOW (*)    APPearance CLEAR (*)    Protein, ur 30 (*)    All other components within normal limits  POC SARS CORONAVIRUS 2 AG - Abnormal; Notable for the following components:   SARS Coronavirus 2 Ag POSITIVE (*)    All other components within normal limits  MAGNESIUM  IRON AND TIBC  PROTIME-INR  APTT  FERRITIN  RETICULOCYTES  POC SARS CORONAVIRUS 2 AG -  ED  TYPE AND SCREEN  PREPARE RBC (CROSSMATCH)  ABO/RH   ____________________________________________   ED ECG REPORT I, Vanessa Dupuyer, the attending physician, personally viewed and interpreted this ECG.  EKG is normal sinus rate of 82, no ST elevation, T wave inversion in lead III with Q-wave in lead III, normal intervals ____________________________________________  RADIOLOGY Robert Bellow, personally viewed and evaluated these images (plain radiographs) as part of my medical decision making, as well as reviewing the written report by the radiologist.  ED MD interpretation: No pneumonia  Official radiology report(s): DG Chest Portable 1  View  Result Date: 11/20/2019 CLINICAL DATA:  Cough EXAM: PORTABLE CHEST 1 VIEW COMPARISON:  None. FINDINGS: Mild enlargement of the cardiopericardial silhouette. Normal mediastinal contour. No pneumothorax. No pleural effusion. Cephalization of the pulmonary vasculature without overt pulmonary edema. No consolidative airspace disease. IMPRESSION: Mild enlargement of the cardiopericardial silhouette. Cephalization of the pulmonary vasculature without overt pulmonary edema. No consolidative airspace disease. Electronically Signed   By: Ilona Sorrel M.D.   On: 11/20/2019 09:06    ____________________________________________   PROCEDURES  Procedure(s) performed (including Critical Care):  .Critical Care Performed by: Vanessa Strandburg, MD Authorized by: Vanessa Montcalm, MD   Critical care provider statement:    Critical care time (minutes):  45   Critical care was necessary to treat or prevent imminent or life-threatening deterioration of the following conditions:  Circulatory failure   Critical care was time spent personally by me on  the following activities:  Discussions with consultants, evaluation of patient's response to treatment, examination of patient, ordering and performing treatments and interventions, ordering and review of laboratory studies, ordering and review of radiographic studies, pulse oximetry, re-evaluation of patient's condition, obtaining history from patient or surrogate and review of old charts     ____________________________________________   INITIAL IMPRESSION / ASSESSMENT AND PLAN / ED COURSE  Debra [name] was evaluated in Emergency Department on 11/20/2019 for the symptoms described in the history of present illness. She was evaluated in the context of the global COVID-19 pandemic, which necessitated consideration that the patient might be at risk for infection with the SARS-CoV-2 virus that causes COVID-19. Institutional protocols and algorithms that pertain  to the evaluation of patients at risk for COVID-19 are in a state of rapid change based on information released by regulatory bodies including the CDC and federal and state organizations. These policies and algorithms were followed during the patient's care in the ED.    Patient is a 54 year old who comes in with multiple symptoms including chills, fever, headache, whole body weakness, feeling like she is about to pass out, decreased appetite, occasional abdominal pain with nausea.  This sounds most concerning for coronavirus.  Will get labs to evaluate for electrolyte abnormalities, AKI, UTI.  Will get Covid swab.  Will get chest x-ray although patient does not feel short of breath or having any chest pain.  Patient has full range of motion of neck does not look meningitic.  Her abdomen is soft and nontender at this time of low suspicion for acute abdominal pathology.  Patient was noted to have a hemoglobin of 6.8.  I reviewed patient's prior records and on 11/02/2018 patient's hemoglobin was 13.4 on care everywhere.   Discussed with patient she said no history of low blood levels.  Patient does report intermittently seen some bright red blood per rectum.  My rectal exam patient has brown stool but was Hemoccult positive.  She denies any NSAIDs, aspirin, ibuprofen.  She denies any alcohol use or liver issues.  She denies ever having a colonoscopy done before.  Patient is willing to consent to blood.  We will add on iron studies given MCV was low given that could also be contributing.  Will transfuse 1 unit of blood.  Patient was coronavirus positive.  We will discussed the hospital team for admission.   ____________________________________________   FINAL CLINICAL IMPRESSION(S) / ED DIAGNOSES   Final diagnoses:  Symptomatic anemia  COVID-19 virus detected      MEDICATIONS GIVEN DURING THIS VISIT:  Medications  0.9 %  sodium chloride infusion (has no administration in time range)  sodium  chloride 0.9 % bolus 1,000 mL (1,000 mLs Intravenous New Bag/Given 11/20/19 0909)  ondansetron (ZOFRAN) injection 4 mg (4 mg Intravenous Given 11/20/19 [id number])     ED Discharge Orders    None       Note:  This document was prepared using Dragon voice recognition software and may include unintentional dictation errors.   Vanessa Wilson, MD 11/20/19 [phone]

## 2019-11-20 NOTE — ED Notes (Signed)
Pt ambulatory to toilet with steady gait. RBC unit just finished. Line flushed.

## 2019-11-20 NOTE — ED Notes (Signed)
Pt ambulatory to bathroom at this time.

## 2019-11-20 NOTE — ED Notes (Signed)
transferring pt to room 126 via stretcher

## 2019-11-20 NOTE — Consult Note (Signed)
Lucilla Lame, MD [facility]  [address], [place], [place] [address] Phone: [phone] Fax : [phone]  Consultation  Referring Provider:     Dr. Blaine Hamper Primary Care Physician:  [name], MD Primary Gastroenterologist: Althia Forts         Reason for Consultation:     Profound anemia  Date of Admission:  11/20/2019 Date of Consultation:  11/20/2019         HPI:   Debra [name] is a 54 y.o. female who was admitted with chills headache fever and weakness and was found to be COVID-19 positive.  The patient does not speak English and the history is obtained through a Optometrist.  The patient had reported that she developed chills and feeling like she was in a pass out and had taken ibuprofen with some relief.  The patient does report that she had some black stools a few days ago but then reports it resolved.  The patient denies any family history of colon cancer or colon polyps. The patient denies ever having an upper endoscopy or colonoscopy in the past.  She also denies ever seeing a gastroenterologist.  There is no previous history of GI bleeding in the past and she denies any alcohol use or abuse.  Past Medical History:  Diagnosis Date  . Thyroid disease     History reviewed. No pertinent surgical history.  Prior to Admission medications   Medication Sig Start Date End Date Taking? Authorizing Provider  Bismuth Subsalicylate (PEPTO-BISMOL PO) Take by mouth as needed.   Yes [provider]  ibuprofen (ADVIL) 200 MG tablet Take 200 mg by mouth every 6 (six) hours as needed.   Yes [provider]  levothyroxine (SYNTHROID) 150 MCG tablet Take 150 mcg by mouth daily before breakfast.   Yes [provider]    No family history on file.   Social History   Tobacco Use  . Smoking status: Never Smoker  . Smokeless tobacco: Never Used  Substance Use Topics  . Alcohol use: Not on file  . Drug use: Not on file    Allergies as of 11/20/2019    . (No Known Allergies)    Review of Systems:    All systems reviewed and negative except where noted in HPI.   Physical Exam:  Vital signs in last 24 hours: Temp:  [98.5 F (36.9 C)-98.7 F (37.1 C)] 98.5 F (36.9 C) (03/14 1150) Pulse Rate:  [57-87] 63 (03/14 1330) Resp:  [18-23] 18 (03/14 1330) BP: (93-132)/(46-64) 93/58 (03/14 1330) SpO2:  [95 %-100 %] 98 % (03/14 1330) Weight:  [97.5 kg] 97.5 kg (03/14 0830)   General:   Pleasant, cooperative in NAD Head:  Normocephalic and atraumatic. Eyes:   No icterus.   Conjunctiva pink. PERRLA. Ears:  Normal auditory acuity. Neck:  Supple; no masses or thyroidomegaly Lungs: Respirations even and unlabored. Lungs clear to auscultation bilaterally.   No wheezes, crackles, or rhonchi.  Heart:  Regular rate and rhythm;  Without murmur, clicks, rubs or gallops Abdomen:  Soft, nondistended, nontender. Normal bowel sounds. No appreciable masses or hepatomegaly.  No rebound or guarding.  Rectal:  Not performed. Msk:  Symmetrical without gross deformities.    Extremities:  Without edema, cyanosis or clubbing. Neurologic:  Alert and oriented x3;  grossly normal neurologically. Skin:  Intact without significant lesions or rashes. Cervical Nodes:  No significant cervical adenopathy. Psych:  Alert and cooperative. Normal affect.  LAB RESULTS: Recent Labs  11/20/19 0859 11/20/19 1143  WBC 4.0 3.6*  HGB 6.8* 6.6*  HCT 24.1* 23.6*  PLT 197 184   BMET Recent Labs    11/20/19 0859  NA 131*  K 3.5  CL 103  CO2 20*  GLUCOSE 156*  BUN 9  CREATININE 0.59  CALCIUM 7.7*   LFT Recent Labs    11/20/19 0859  PROT 6.5  ALBUMIN 3.5  AST 27  ALT 15  ALKPHOS 69  BILITOT 0.5   PT/INR Recent Labs    11/20/19 0941  LABPROT 15.4*  INR 1.2    STUDIES: DG Chest Portable 1 View  Result Date: 11/20/2019 CLINICAL DATA:  Cough EXAM: PORTABLE CHEST 1 VIEW COMPARISON:  None. FINDINGS: Mild enlargement of the cardiopericardial  silhouette. Normal mediastinal contour. No pneumothorax. No pleural effusion. Cephalization of the pulmonary vasculature without overt pulmonary edema. No consolidative airspace disease. IMPRESSION: Mild enlargement of the cardiopericardial silhouette. Cephalization of the pulmonary vasculature without overt pulmonary edema. No consolidative airspace disease. Electronically Signed   By: Ilona Sorrel M.D.   On: 11/20/2019 09:06      Impression / Plan:   Assessment: Principal Problem:   Symptomatic anemia Active Problems:   COVID-19 virus infection   Hypothyroidism   Iron deficiency anemia   GIB (gastrointestinal bleeding)   Debra [name] is a 54 y.o. y/o female with an admission for chills headache fever and weakness consistent with her diagnosis of Covid 19 infection.  The patient was found to have significant anemia with a hemoglobin of 6.6.  The patient's previous hemoglobin and February of last year was 13.4.  Plan:  This patient comes in with COVID-19 and profound anemia and has never had an EGD or colonoscopy.  The patient is receiving multiple units of blood transfusions and will be set up for a EGD and colonoscopy to look for the source of her profound anemia.  The patient will be kept n.p.o. after midnight and put on a clear liquid diet today in addition to receiving a prep.  The patient has already been ordered a PPI.  The patient has been explained the plan and agrees with it.  Thank you for involving me in the care of this patient.      LOS: 0 days   Lucilla Lame, MD  11/20/2019, 3:05 PM Pager [phone] 7am-5pm  Check AMION for 5pm -7am coverage and on weekends   Note: This dictation was prepared with Dragon dictation along with smaller phrase technology. Any transcriptional errors that result from this process are unintentional.

## 2019-11-20 NOTE — H&P (Signed)
History and Physical    Debra [name] [id number] DOB: [birth year] DOA: 11/20/2019  Referring MD/NP/PA:   PCP: [name], MD   Patient coming from:  The patient is coming from home.  At baseline, pt is independent for most of ADL.        Chief Complaint: Generalized weakness, dark stool  HPI: Debra [name] is a 54 y.o. female with medical history significant of hypothyroidism, who presents with generalized weakness, dark stool.  Patient is peak Spanish, history is obtained with the help of iPad translator.  Patient states that she has been having generalized weakness recently, does not feel well.  She has chills, no fever, and fells body uncomfortable.  She reports dark stool recently.  Ibuprofen is on her medication list, but she states she does not take this often.  No nausea, vomiting, diarrhea or abdominal pain.  Patient denies chest pain, shortness breath, cough.  No symptoms of UTI or unilateral weakness.  Patient does not drink alcohol.  ED Course: pt was found to have positive COVID-19 Ag, positive FOBT, hemoglobin 6.8 (13.4 on 11/02/2018), INR 1.2, PTT 33, negative urinalysis, temperature normal, oxygen saturation 99% on room air, blood pressure 99/63, heart rate 79, chest x-ray negative, but showed mild cardiomegaly.  Patient is admitted to [facility] bed as inpatient.  GI, Dr. Allen Norris was consulted  Review of Systems:   General: no fevers, chills, no body weight gain, has poor appetite, has fatigue HEENT: no blurry vision, hearing changes or sore throat Respiratory: no dyspnea, coughing, wheezing CV: no chest pain, no palpitations GI: no nausea, vomiting, abdominal pain, diarrhea, constipation GU: no dysuria, burning on urination, increased urinary frequency, hematuria  Ext: no leg edema Neuro: no unilateral weakness, numbness, or tingling, no vision change or hearing loss Skin: no rash, no skin tear. MSK: No muscle spasm, no deformity, no limitation of range of  movement in spin Heme: No easy bruising.  Travel history: No recent long distant travel.  Allergy: No Known Allergies  Past Medical History:  Diagnosis Date  . Hypothyroidism   . Thyroid disease     History reviewed. No pertinent surgical history.  Social History:  reports that she has never smoked. She has never used smokeless tobacco. She reports that she does not drink alcohol or use drugs.  Family History: Reviewed with patient, but patient states that all family members are healthy  Prior to Admission medications   Medication Sig Start Date End Date Taking? Authorizing Provider  Bismuth Subsalicylate (PEPTO-BISMOL PO) Take by mouth as needed.   Yes [provider]  ibuprofen (ADVIL) 200 MG tablet Take 200 mg by mouth every 6 (six) hours as needed.   Yes [provider]  levothyroxine (SYNTHROID) 150 MCG tablet Take 150 mcg by mouth daily before breakfast.   Yes [provider]    Physical Exam: Vitals:   11/20/19 1500 11/20/19 1504 11/20/19 1630 11/20/19 1700  BP: 104/65  93/60 110/67  Pulse: (!) 57  (!) 55 (!) 57  Resp: 16  17 19   Temp:  98.4 F (36.9 C)    TempSrc:      SpO2: 100%  99% 98%  Weight:      Height:       General: Not in acute distress.  Pale looking HEENT:       Eyes: PERRL, EOMI, no scleral icterus.       ENT: No discharge from the ears and nose, no pharynx injection, no tonsillar enlargement.  Neck: No JVD, no bruit, no mass felt. Heme: No neck lymph node enlargement. Cardiac: S1/S2, RRR, No murmurs, No gallops or rubs. Respiratory: No rales, wheezing, rhonchi or rubs. GI: Soft, nondistended, nontender, no rebound pain, no organomegaly, BS present. GU: No hematuria Ext: No pitting leg edema bilaterally. 2+DP/PT pulse bilaterally. Musculoskeletal: No joint deformities, No joint redness or warmth, no limitation of ROM in spin. Skin: No rashes.  Neuro: Alert, oriented X3, cranial nerves II-XII grossly intact, moves  all extremities normally. Psych: Patient is not psychotic, no suicidal or hemocidal ideation.  Labs on Admission: I have personally reviewed following labs and imaging studies  CBC: Recent Labs  Lab 11/20/19 0859 11/20/19 1143  WBC 4.0 3.6*  NEUTROABS 2.7  --   HGB 6.8* 6.6*  HCT 24.1* 23.6*  MCV 61.2* 61.9*  PLT 197 [id number]   Basic Metabolic Panel: Recent Labs  Lab 11/20/19 0859  NA 131*  K 3.5  CL 103  CO2 20*  GLUCOSE 156*  BUN 9  CREATININE 0.59  CALCIUM 7.7*  MG 2.0   GFR: Estimated Creatinine Clearance: 83.2 mL/min (by C-G formula based on SCr of 0.59 mg/dL). Liver Function Tests: Recent Labs  Lab 11/20/19 0859  AST 27  ALT 15  ALKPHOS 69  BILITOT 0.5  PROT 6.5  ALBUMIN 3.5   No results for input(s): LIPASE, AMYLASE in the last 168 hours. No results for input(s): AMMONIA in the last 168 hours. Coagulation Profile: Recent Labs  Lab 11/20/19 0941  INR 1.2   Cardiac Enzymes: No results for input(s): CKTOTAL, CKMB, CKMBINDEX, TROPONINI in the last 168 hours. BNP (last 3 results) No results for input(s): PROBNP in the last 8760 hours. HbA1C: No results for input(s): HGBA1C in the last 72 hours. CBG: No results for input(s): GLUCAP in the last 168 hours. Lipid Profile: No results for input(s): CHOL, HDL, LDLCALC, TRIG, CHOLHDL, LDLDIRECT in the last 72 hours. Thyroid Function Tests: No results for input(s): TSH, T4TOTAL, FREET4, T3FREE, THYROIDAB in the last 72 hours. Anemia Panel: Recent Labs    11/20/19 0941  FERRITIN 6*  TIBC 374  IRON 13*  RETICCTPCT 0.8   Urine analysis:    Component Value Date/Time   COLORURINE YELLOW (A) 11/20/2019 0859   APPEARANCEUR CLEAR (A) 11/20/2019 0859   LABSPEC 1.012 11/20/2019 0859   PHURINE 7.0 11/20/2019 0859   GLUCOSEU NEGATIVE 11/20/2019 0859   HGBUR NEGATIVE 11/20/2019 0859   [place] NEGATIVE 11/20/2019 0859   KETONESUR NEGATIVE 11/20/2019 0859   PROTEINUR 30 (A) 11/20/2019 0859   NITRITE  NEGATIVE 11/20/2019 0859   LEUKOCYTESUR NEGATIVE 11/20/2019 0859   Sepsis Labs: @LABRCNTIP (procalcitonin:4,lacticidven:4) )No results found for this or any previous visit (from the past 240 hour(s)).   Radiological Exams on Admission: DG Chest Portable 1 View  Result Date: 11/20/2019 CLINICAL DATA:  Cough EXAM: PORTABLE CHEST 1 VIEW COMPARISON:  None. FINDINGS: Mild enlargement of the cardiopericardial silhouette. Normal mediastinal contour. No pneumothorax. No pleural effusion. Cephalization of the pulmonary vasculature without overt pulmonary edema. No consolidative airspace disease. IMPRESSION: Mild enlargement of the cardiopericardial silhouette. Cephalization of the pulmonary vasculature without overt pulmonary edema. No consolidative airspace disease. Electronically Signed   By: Ilona Sorrel M.D.   On: 11/20/2019 09:06     EKG: Independently reviewed.  Sinus rhythm, QTC 434, early R wave progression, nonspecific T wave change  Assessment/Plan Principal Problem:   Symptomatic anemia Active Problems:   COVID-19 virus infection   Hypothyroidism   Iron deficiency anemia  GIB (gastrointestinal bleeding)   Symptomatic anemia due to possible GIB: pt never had endoscopy before. FOBT positive. Dr. Allen Norris of GI is consulted.  - will admit to med-surg bed as inpt - transfuse 1 units of blood now - GI consulted by Ed, will follow up recommendations - NPO after MN - IVF: 1L NS bolus, then at 75 mL/hr - Start IV pantoprazole gtt - Zofran IV for nausea - Avoid NSAIDs and SQ heparin - Maintain IV access (2 large bore IVs if possible). - Monitor closely and follow q6h cbc, transfuse as necessary, if Hgb<7.0 - LaB: INR, PTT and type screen  Iron deficiency anemia: Anemia panel showed iron 13, U IBC 361, saturation ratio 4, ferritin 6 -Give 1 dose of Feraheme 510 mg -start ferrous sulfate supplement  COVID-19 virus infection: Patient is asymptomatic.  No cough, fever, chills, chest  pain.  Oxygen saturation 99% on room air.  Chest x-ray has no infiltration. -Vitamin C and zinc sulfate  Hypothyroidism: -Synthroid    Inpatient status:  # Patient requires inpatient status due to high intensity of service, high risk for further deterioration and high frequency of surveillance required.  I certify that at the point of admission it is my clinical judgment that the patient will require inpatient hospital care spanning beyond 2 midnights from the point of admission.  . This patient has hx of hypothyroidism, . Now patient has presenting with symptomatic anemia due to GIB, with iron deficiency.  Also has COVID-19 infection . the worrisome physical exam findings include pale looking . The initial radiographic and laboratory data are worrisome because of positive COVID-19 Ag test, positive FOBT, and deficiency, hemoglobin dropped from 13.4 to 6.8 . Current medical needs: please see my assessment and plan . Predictability of an adverse outcome (risk): Patient has multiple comorbidities as listed above. Now presents with symptomatic anemia due to GIB, with iron deficiency.  Also has COVID-19 infection. Patient's presentation is highly complicated.  Patient is at high risk of deteriorating.  Will need to be treated in hospital for at least 2 days.          DVT ppx: SCD Code Status: Full code Family Communication: not done, no family member is at bed side.  I tried to have called her daughter 3 times without success. Disposition Plan:  Anticipate discharge back to previous home environment Consults called: GI, Dr. Allen Norris Admission status: Med-surg bed as inpt     Date of Service 11/20/2019    [facility]   If 7PM-7AM, please contact night-coverage [email or web address] 11/20/2019, 5:20 PM

## 2019-11-20 NOTE — ED Triage Notes (Signed)
Pt arrived via POV with reports of chills, fever, headache, and generalized body aches, worse over night. C/o some nauseat at times too. Pt states she took pepto bismol last night which helped.  Pt denies any COVID contacts.

## 2019-11-21 ENCOUNTER — Encounter: Payer: Self-pay | Admitting: Internal Medicine

## 2019-11-21 ENCOUNTER — Encounter
Admission: EM | Disposition: A | Discharge status: Home / Self Care | Payer: Self-pay | Source: Home / Self Care | Attending: Internal Medicine

## 2019-11-21 ENCOUNTER — Inpatient Hospital Stay: Payer: Self-pay | Admitting: Anesthesiology

## 2019-11-21 ENCOUNTER — Inpatient Hospital Stay: Payer: Self-pay

## 2019-11-21 DIAGNOSIS — K317 Polyp of stomach and duodenum: Secondary | ICD-10-CM

## 2019-11-21 DIAGNOSIS — K644 Residual hemorrhoidal skin tags: Secondary | ICD-10-CM

## 2019-11-21 DIAGNOSIS — D649 Anemia, unspecified: Secondary | ICD-10-CM

## 2019-11-21 DIAGNOSIS — D509 Iron deficiency anemia, unspecified: Secondary | ICD-10-CM

## 2019-11-21 DIAGNOSIS — E039 Hypothyroidism, unspecified: Secondary | ICD-10-CM

## 2019-11-21 DIAGNOSIS — D500 Iron deficiency anemia secondary to blood loss (chronic): Secondary | ICD-10-CM

## 2019-11-21 DIAGNOSIS — K56699 Other intestinal obstruction unspecified as to partial versus complete obstruction: Principal | ICD-10-CM

## 2019-11-21 DIAGNOSIS — U071 COVID-19: Secondary | ICD-10-CM

## 2019-11-21 HISTORY — PX: ESOPHAGOGASTRODUODENOSCOPY (EGD) WITH PROPOFOL: SHX5813

## 2019-11-21 HISTORY — PX: COLONOSCOPY WITH PROPOFOL: SHX5780

## 2019-11-21 LAB — BASIC METABOLIC PANEL
Anion gap: 7 (ref 5–15)
BUN: 7 mg/dL (ref 6–20)
CO2: 22 mmol/L (ref 22–32)
Calcium: 7.7 mg/dL — ABNORMAL LOW (ref 8.9–10.3)
Chloride: 108 mmol/L (ref 98–111)
Creatinine, Ser: 0.55 mg/dL (ref 0.44–1.00)
GFR calc Af Amer: >60 mL/min (ref >60)
GFR calc non Af Amer: >60 mL/min (ref >60)
Glucose, Bld: 87 mg/dL (ref 70–99)
Potassium: 3.5 mmol/L (ref 3.5–5.1)
Sodium: 137 mmol/L (ref 135–145)

## 2019-11-21 LAB — TYPE AND SCREEN
ABO/RH(D): O POS
Antibody Screen: NEGATIVE
Unit division: 0

## 2019-11-21 LAB — CBC
HCT: 28.4 % — ABNORMAL LOW (ref 36.0–46.0)
HCT: 29.7 % — ABNORMAL LOW (ref 36.0–46.0)
HCT: 30.9 % — ABNORMAL LOW (ref 36.0–46.0)
Hemoglobin: 8.2 g/dL — ABNORMAL LOW (ref 12.0–15.0)
Hemoglobin: 8.4 g/dL — ABNORMAL LOW (ref 12.0–15.0)
Hemoglobin: 8.8 g/dL — ABNORMAL LOW (ref 12.0–15.0)
MCH: 18.9 pg — ABNORMAL LOW (ref 26.0–34.0)
MCH: 18.8 pg — ABNORMAL LOW (ref 26.0–34.0)
MCH: 18.7 pg — ABNORMAL LOW (ref 26.0–34.0)
MCHC: 28.9 g/dL — ABNORMAL LOW (ref 30.0–36.0)
MCHC: 28.3 g/dL — ABNORMAL LOW (ref 30.0–36.0)
MCHC: 28.5 g/dL — ABNORMAL LOW (ref 30.0–36.0)
MCV: 65.6 fL — ABNORMAL LOW (ref 80.0–100.0)
MCV: 66.4 fL — ABNORMAL LOW (ref 80.0–100.0)
MCV: 65.7 fL — ABNORMAL LOW (ref 80.0–100.0)
Platelets: 149 10*3/uL — ABNORMAL LOW (ref 150–400)
Platelets: 156 10*3/uL (ref 150–400)
Platelets: 176 10*3/uL (ref 150–400)
RBC: 4.33 MIL/uL (ref 3.87–5.11)
RBC: 4.47 MIL/uL (ref 3.87–5.11)
RBC: 4.7 MIL/uL (ref 3.87–5.11)
RDW: 22.8 % — ABNORMAL HIGH (ref 11.5–15.5)
RDW: 22.9 % — ABNORMAL HIGH (ref 11.5–15.5)
RDW: 23.5 % — ABNORMAL HIGH (ref 11.5–15.5)
WBC: 4 10*3/uL (ref 4.0–10.5)
WBC: 4.2 10*3/uL (ref 4.0–10.5)
WBC: 4.9 10*3/uL (ref 4.0–10.5)
nRBC: 0 % (ref 0.0–0.2)
nRBC: 0 % (ref 0.0–0.2)
nRBC: 0 % (ref 0.0–0.2)

## 2019-11-21 LAB — GLUCOSE, CAPILLARY
Glucose-Capillary: 74 mg/dL (ref 70–99)
Glucose-Capillary: 81 mg/dL (ref 70–99)

## 2019-11-21 LAB — BPAM RBC
Blood Product Expiration Date: 202104082359
ISSUE DATE / TIME: 202103141126
Unit Type and Rh: 5100

## 2019-11-21 LAB — HIV ANTIBODY (ROUTINE TESTING W REFLEX): HIV Screen 4th Generation wRfx: NONREACTIVE

## 2019-11-21 SURGERY — ESOPHAGOGASTRODUODENOSCOPY (EGD) WITH PROPOFOL
Anesthesia: General

## 2019-11-21 MED ORDER — EPHEDRINE SULFATE 50 MG/ML IJ SOLN
INTRAMUSCULAR | Status: DC | PRN
  Administered 2019-11-21: 15 mg via INTRAVENOUS
  Administered 2019-11-21: 10 mg via INTRAVENOUS

## 2019-11-21 MED ORDER — PROPOFOL 500 MG/50ML IV EMUL
INTRAVENOUS | Status: AC
  Filled 2019-11-21: qty 50

## 2019-11-21 MED ORDER — PROPOFOL 10 MG/ML IV BOLUS
INTRAVENOUS | Status: DC | PRN
  Administered 2019-11-21: 90 mg via INTRAVENOUS

## 2019-11-21 MED ORDER — SODIUM CHLORIDE 0.9 % IV SOLN: INTRAVENOUS | Status: DC | PRN

## 2019-11-21 MED ORDER — EPHEDRINE 5 MG/ML INJ
INTRAVENOUS | Status: AC
  Filled 2019-11-21: qty 4

## 2019-11-21 MED ORDER — SPOT INK MARKER SYRINGE KIT
PACK | SUBMUCOSAL | Status: DC | PRN
  Administered 2019-11-21: 5 mL via SUBMUCOSAL

## 2019-11-21 MED ORDER — PHENYLEPHRINE HCL (PRESSORS) 10 MG/ML IV SOLN: INTRAVENOUS | Status: DC | PRN

## 2019-11-21 MED ORDER — SODIUM CHLORIDE 0.9 % IV SOLN
200.0000 mg | Freq: Once | INTRAVENOUS | Status: AC
  Administered 2019-11-21: 200 mg via INTRAVENOUS
  Filled 2019-11-21: qty 10

## 2019-11-21 MED ORDER — IOHEXOL 9 MG/ML PO SOLN
500.0000 mL | ORAL | Status: AC
  Administered 2019-11-21 (×2): 500 mL via ORAL

## 2019-11-21 MED ORDER — IOHEXOL 300 MG/ML  SOLN
100.0000 mL | Freq: Once | INTRAMUSCULAR | Status: AC | PRN
  Administered 2019-11-21: 100 mL via INTRAVENOUS

## 2019-11-21 MED ORDER — PROPOFOL 500 MG/50ML IV EMUL
INTRAVENOUS | Status: DC | PRN
  Administered 2019-11-21: 150 ug/kg/min via INTRAVENOUS

## 2019-11-21 MED ORDER — LIDOCAINE HCL (CARDIAC) PF 100 MG/5ML IV SOSY
PREFILLED_SYRINGE | INTRAVENOUS | Status: DC | PRN
  Administered 2019-11-21: 50 mg via INTRATRACHEAL

## 2019-11-21 NOTE — Op Note (Signed)
[facility] Patient Name: Debra [name] Procedure Date: 11/21/2019 2:44 PM MRN: [id number] Account #: [id number] Date of Birth: 1966-07-24 Admit Type: Outpatient Age: 54 Room: [facility] ENDO ROOM 1 Gender: Female Note Status: Finalized Procedure:             Upper GI endoscopy Indications:           Iron deficiency anemia, Unexplained iron deficiency                         anemia Providers:             Lin Landsman MD, MD Medicines:             Monitored Anesthesia Care Complications:         No immediate complications. Estimated blood loss:                         Minimal. Procedure:             Pre-Anesthesia Assessment:                        - Prior to the procedure, a History and Physical was                         performed, and patient medications and allergies were                         reviewed. The patient is competent. The risks and                         benefits of the procedure and the sedation options and                         risks were discussed with the patient. All questions                         were answered and informed consent was obtained.                         Patient identification and proposed procedure were                         verified by the physician, the nurse, the                         anesthesiologist, the anesthetist and the technician                         in the pre-procedure area in the procedure room in the                         endoscopy suite. Mental Status Examination: alert and                         oriented. Airway Examination: normal oropharyngeal                         airway and neck mobility. Respiratory Examination:  clear to auscultation. CV Examination: normal.                         Prophylactic Antibiotics: The patient does not require                         prophylactic antibiotics. Prior Anticoagulants: The                         patient  has taken no previous anticoagulant or                         antiplatelet agents. ASA Grade Assessment: II - A                         patient with mild systemic disease. After reviewing                         the risks and benefits, the patient was deemed in                         satisfactory condition to undergo the procedure. The                         anesthesia plan was to use monitored anesthesia care                         (MAC). Immediately prior to administration of                         medications, the patient was re-assessed for adequacy                         to receive sedatives. The heart rate, respiratory                         rate, oxygen saturations, blood pressure, adequacy of                         pulmonary ventilation, and response to care were                         monitored throughout the procedure. The physical                         status of the patient was re-assessed after the                         procedure.                        After obtaining informed consent, the endoscope was                         passed under direct vision. Throughout the procedure,                         the patient's blood pressure, pulse, and oxygen  saturations were monitored continuously. The Endoscope                         was introduced through the mouth, and advanced to the                         second part of duodenum. The upper GI endoscopy was                         accomplished without difficulty. The patient tolerated                         the procedure well. Findings:      The duodenal bulb and second portion of the duodenum were normal.       Biopsies for histology were taken with a cold forceps for evaluation of       celiac disease.      A few diminutive sessile polyps with no bleeding and no stigmata of       recent bleeding were found in the gastric body.      The gastric fundus, incisura and gastric antrum were  normal. Biopsies       were taken with a cold forceps for Helicobacter pylori testing.      The gastroesophageal junction and examined esophagus were normal. Impression:            - Normal duodenal bulb and second portion of the                         duodenum. Biopsied.                        - A few gastric polyps.                        - Normal gastric fundus, incisura and antrum. Biopsied.                        - Normal gastroesophageal junction and esophagus. Recommendation:        - Await pathology results.                        - Proceed with colonoscopy as scheduled                        See colonoscopy report Procedure Code(s):     --- Professional ---                        [phone], Esophagogastroduodenoscopy, flexible,                         transoral; with biopsy, single or multiple Diagnosis Code(s):     --- Professional ---                        K31.7, Polyp of stomach and duodenum                        D50.9, Iron deficiency anemia, unspecified CPT copyright 2019 [employer]. All rights reserved. The codes documented in this report are preliminary and upon  coder review may  be revised to meet current compliance requirements. Dr. Ulyess Mort Lin Landsman MD, MD 11/21/2019 3:45:11 PM This report has been signed electronically. Number of Addenda: 0 Note Initiated On: 11/21/2019 2:44 PM Estimated Blood Loss:  Estimated blood loss was minimal.      [facility]

## 2019-11-21 NOTE — Consult Note (Addendum)
Hematology/Oncology Consult note [facility] [phone] [phone]  Patient Care Team: [name], MD as PCP - General (Family Medicine) [name], RN as Registered Nurse [name], RN as Registered Nurse   Name of the patient: Debra [name]  [id number]  [birth year]   Date of visit: 11/21/19 REASON FOR COSULTATION:  Malignant appearance sigmoid colon stricture History of presenting illness-  54 y.o. female with PMH listed at below who presents to ER for evaluation of dark stool and generalized weakness.  Patient is Spanish-speaking, online Spanish interpreter service was used for the entire encounter. Patient reports feeling weak recently, dark stool.  She uses ibuprofen occasionally.  Denies any abdominal pain. Patient was screened positive for COVID-19 antigen.  Stool occult positive.  Hemoglobin was found to be at 6.6, down from 13.4 on 11/02/2018.  Patient is status post 1 unit of PRBC transfusion. Gastroenterology was consulted and patient was evaluated. Colonoscopy 11/21/2019 showed a malignant appearing severe stenosis found in the sigmoid colon at 25 cm proximal to the anus and was nontransversed.  Biopsy was taken.  Surgical pathology is pending  Nonbleeding external hemorrhoids Oncology was consulted for management of malignant appearance of sigmoid colon stricture. Patient also reports some weight loss recently.  Cannot specify details. Denies shortness of breath, cough, fever, chills, denies abdominal pain.  She denies any family history of cancer. Review of Systems  Constitutional: Positive for fatigue and unexpected weight change.  HENT:   Negative for sore throat.   Respiratory: Negative for shortness of breath.   Cardiovascular: Negative for chest pain.  Gastrointestinal: Positive for blood in stool. Negative for abdominal pain.  Genitourinary: Negative for dysuria.   Musculoskeletal: Negative for neck  pain.  Skin: Negative for rash.  Neurological: Negative for headaches.  Hematological: Negative for adenopathy.  Psychiatric/Behavioral: Negative for confusion.    No Known Allergies  Patient Active Problem List   Diagnosis Date Noted  . COVID-19 virus detected 11/20/2019  . Symptomatic anemia 11/20/2019  . Hypothyroidism 11/20/2019  . Iron deficiency anemia 11/20/2019  . GIB (gastrointestinal bleeding) 11/20/2019     Past Medical History:  Diagnosis Date  . Hypothyroidism   . Thyroid disease      History reviewed. No pertinent surgical history.  Social History   Socioeconomic History  . Marital status: Married    Spouse name: Not on file  . Number of children: Not on file  . Years of education: Not on file  . Highest education level: Not on file  Occupational History  . Not on file  Tobacco Use  . Smoking status: Never Smoker  . Smokeless tobacco: Never Used  Substance and Sexual Activity  . Alcohol use: Never  . Drug use: Never  . Sexual activity: Not on file  Other Topics Concern  . Not on file  Social History Narrative  . Not on file   Social Determinants of Health   Financial Resource Strain:   . Difficulty of Paying Living Expenses:   Food Insecurity:   . Worried About Charity fundraiser in the Last Year:   . Arboriculturist in the Last Year:   Transportation Needs:   . Film/video editor (Medical):   Marland Kitchen Lack of Transportation (Non-Medical):   Physical Activity:   . Days of Exercise per Week:   . Minutes of Exercise per Session:   Stress:   . Feeling of Stress :   Social Connections:   .  Frequency of Communication with Friends and Family:   . Frequency of Social Gatherings with Friends and Family:   . Attends Religious Services:   . Active Member of Clubs or Organizations:   . Attends Archivist Meetings:   Marland Kitchen Marital Status:   Intimate Partner Violence:   . Fear of Current or Ex-Partner:   . Emotionally Abused:   Marland Kitchen  Physically Abused:   . Sexually Abused:      No family history on file.   Current Facility-Administered Medications:  .  acetaminophen (TYLENOL) tablet 650 mg, 650 mg, Oral, Q6H PRN **OR** acetaminophen (TYLENOL) suppository 650 mg, 650 mg, Rectal, Q6H PRN, Ivor Costa, MD .  ascorbic acid (VITAMIN C) tablet 500 mg, 500 mg, Oral, Daily, Ivor Costa, MD .  ferrous sulfate tablet 325 mg, 325 mg, Oral, BID WC, Ivor Costa, MD .  iron sucrose (VENOFER) 200 mg in sodium chloride 0.9 % 100 mL IVPB, 200 mg, Intravenous, Once, Earlie Server, MD .  levothyroxine (SYNTHROID) tablet 150 mcg, 150 mcg, Oral, QAC breakfast, Ivor Costa, MD, 150 mcg at 11/21/19 [id number] .  ondansetron (ZOFRAN) injection 4 mg, 4 mg, Intravenous, Q8H PRN, Ivor Costa, MD .  ondansetron (ZOFRAN) tablet 4 mg, 4 mg, Oral, Q6H PRN **OR** [DISCONTINUED] ondansetron (ZOFRAN) injection 4 mg, 4 mg, Intravenous, Q6H PRN, Ivor Costa, MD .  pantoprazole (PROTONIX) 80 mg in sodium chloride 0.9 % 100 mL (0.8 mg/mL) infusion, 8 mg/hr, Intravenous, Continuous, Ivor Costa, MD, Last Rate: 10 mL/hr at 11/20/19 2300, 8 mg/hr at 11/20/19 2300 .  [START ON 11/23/2019] pantoprazole (PROTONIX) injection 40 mg, 40 mg, Intravenous, Q12H, Ivor Costa, MD .  senna-docusate (Senokot-S) tablet 1 tablet, 1 tablet, Oral, QHS PRN, Ivor Costa, MD .  zinc sulfate capsule 220 mg, 220 mg, Oral, Daily, Ivor Costa, MD   Physical exam:  Vitals:   11/21/19 1626 11/21/19 1631 11/21/19 1641 11/21/19 1651  BP: (!) 90/47 (!) 90/47 (!) 118/56 119/60  Pulse:  88 90 87  Resp:      Temp:      TempSrc:      SpO2:  90% 94% 95%  Weight:      Height:       Physical Exam  Constitutional: No distress.  HENT:  Head: Normocephalic.  Eyes: No scleral icterus.  Cardiovascular: Normal rate.  Pulmonary/Chest: Effort normal. No respiratory distress.  Abdominal: Soft. Bowel sounds are normal. She exhibits no distension.  Musculoskeletal:        General: Normal range of motion.      Cervical [name]: Neck supple.  Neurological: She is alert.  Skin: There is pallor.  Multiple skin areas with hyperpigmentation- vitiligo  Psychiatric: Affect normal.        CMP Latest Ref Rng & Units 11/21/2019  Glucose 70 - 99 mg/dL 87  BUN 6 - 20 mg/dL 7  Creatinine 0.44 - 1.00 mg/dL 0.55  Sodium 135 - 145 mmol/L 137  Potassium 3.5 - 5.1 mmol/L 3.5  Chloride 98 - 111 mmol/L 108  CO2 22 - 32 mmol/L 22  Calcium 8.9 - 10.3 mg/dL 7.7(L)  Total Protein 6.5 - 8.1 g/dL -  Total Bilirubin 0.3 - 1.2 mg/dL -  Alkaline Phos 38 - 126 U/L -  AST 15 - 41 U/L -  ALT 0 - 44 U/L -   CBC Latest Ref Rng & Units 11/21/2019  WBC 4.0 - 10.5 K/uL 4.9  Hemoglobin 12.0 - 15.0 g/dL 8.8(L)  Hematocrit 36.0 - 46.0 %  30.9(L)  Platelets 150 - 400 K/uL 176   RADIOGRAPHIC STUDIES: I have personally reviewed the radiological images as listed and agreed with the findings in the report.  DG Chest Portable 1 View  Result Date: 11/20/2019 CLINICAL DATA:  Cough EXAM: PORTABLE CHEST 1 VIEW COMPARISON:  None. FINDINGS: Mild enlargement of the cardiopericardial silhouette. Normal mediastinal contour. No pneumothorax. No pleural effusion. Cephalization of the pulmonary vasculature without overt pulmonary edema. No consolidative airspace disease. IMPRESSION: Mild enlargement of the cardiopericardial silhouette. Cephalization of the pulmonary vasculature without overt pulmonary edema. No consolidative airspace disease. Electronically Signed   By: Ilona Sorrel M.D.   On: 11/20/2019 09:06   #09/10/2018 bilateral diagnostic mammogram showed right 2:00 breast skin thickening and discoloration for which dermatology consultation was recommended at that point.  Assessment and plan- Patient is a 54 y.o. female presents for evaluation of general weakness, dark stool. Initial work-up showed significant iron deficiency anemia, colonoscopy showed malignant appearing sigmoid colon stricture.  #Malignant appearing sigmoid colon  Stricture, pathology Colonoscopy findings were reviewed and discussed with patient. Pathology is pending.  Suspect malignancy. Patient has been seen by surgery Dr. Cathlean Cower and his staging CT has been ordered. Check CEA.  #Severe iron deficiency anemia, secondary to chronic blood loss Hemoglobin initially was 6.6, status post 1 unit of PRBC transfusion. Avoid NSAIDs Iron panel showed ferritin of 6, iron saturation 4.  Consistent with severe iron deficiency. In the context of chronic blood loss, severity of iron deficiency, I recommend IV Venofer 200 mg x 1 to further optimize her iron store and blood level. Allergy reactions/infusion reaction including anaphylactic reaction discussed with patient. Other side effects include but not limited to high blood pressure, skin rash, weight gain, leg swelling, etc. Patient voices understanding and and willing to proceed.  #COVID-19 virus infection, patient is asymptomatic.  No cough, fever, chills, chest discomfort.  Saturating well on room air.  Chest x-ray was reviewed.  No infiltration. DVT prophylaxis-SCDs, no pharmacological prophylaxis due to underlying GI bleeding.  I called patient's daughter Debra [name] at [phone] and updated the plan with her.  She appreciates the update and voices understanding and agreement of the plan.    Thank you for allowing me to participate in the care of this patient.   Earlie Server, MD, PhD Hematology Oncology [facility] Pager- [phone] 11/21/2019

## 2019-11-21 NOTE — Brief Op Note (Signed)
Dr. Celine Ahr, surgeon, at bedside for consult. Assisted by video spanish interpreter.

## 2019-11-21 NOTE — Progress Notes (Signed)
Received call from endoscopy regarding Debra [name]. Report given. Explained that consents have not been signed as patient is non-english speaking and it is unclear at this time to this nurse, even with interpretation that patient fully understands endoscopy risks and benefits. Consents left on chart to go with patient when she leaves for procedure.

## 2019-11-21 NOTE — Transfer of Care (Signed)
Immediate Anesthesia Transfer of Care Note  Patient: Debra [name]  Procedure(s) Performed: ESOPHAGOGASTRODUODENOSCOPY (EGD) WITH PROPOFOL (N/A ) COLONOSCOPY WITH PROPOFOL (N/A )  Patient Location: Endoscopy Unit  Anesthesia Type:General  Level of Consciousness: drowsy and patient cooperative  Airway & Oxygen Therapy: Patient Spontanous Breathing and Patient connected to nasal cannula oxygen  Post-op Assessment: Report given to RN and Post -op Vital signs reviewed and stable  Post vital signs: Reviewed and stable  Last Vitals:  Vitals Value Taken Time  BP 108/51 11/21/19 1601  Temp    Pulse    Resp 16 11/21/19 1601  SpO2      Last Pain:  Vitals:   11/21/19 1529  TempSrc:   PainSc: 0-No pain         Complications: No apparent anesthesia complications

## 2019-11-21 NOTE — Anesthesia Postprocedure Evaluation (Signed)
Anesthesia Post Note  Patient: Cressa Dissinger  Procedure(s) Performed: ESOPHAGOGASTRODUODENOSCOPY (EGD) WITH PROPOFOL (N/A ) COLONOSCOPY WITH PROPOFOL (N/A )  Patient location during evaluation: PACU Anesthesia Type: General Level of consciousness: awake and alert Pain management: pain level controlled Vital Signs Assessment: post-procedure vital signs reviewed and stable Respiratory status: spontaneous breathing, nonlabored ventilation, respiratory function stable and patient connected to nasal cannula oxygen Cardiovascular status: blood pressure returned to baseline and stable Postop Assessment: no apparent nausea or vomiting Anesthetic complications: no     Last Vitals:  Vitals:   11/21/19 1641 11/21/19 1651  BP: (!) 118/56 119/60  Pulse: 90 87  Resp:    Temp:    SpO2: 94% 95%    Last Pain:  Vitals:   11/21/19 1605  TempSrc:   PainSc: 0-No pain                 Molli Barrows

## 2019-11-21 NOTE — Op Note (Signed)
[facility] Patient Name: Debra [name] Procedure Date: 11/21/2019 2:43 PM MRN: [id number] Account #: [id number] Date of Birth: May 06, 1966 Admit Type: Outpatient Age: 54 Room: [facility] ENDO ROOM 1 Gender: Female Note Status: Finalized Procedure:             Colonoscopy Indications:           Unexplained iron deficiency anemia Providers:             Lin Landsman MD, MD Medicines:             Monitored Anesthesia Care Complications:         No immediate complications. Estimated blood loss:                         Minimal. Procedure:             Pre-Anesthesia Assessment:                        - Prior to the procedure, a History and Physical was                         performed, and patient medications and allergies were                         reviewed. The patient is competent. The risks and                         benefits of the procedure and the sedation options and                         risks were discussed with the patient. All questions                         were answered and informed consent was obtained.                         Patient identification and proposed procedure were                         verified by the physician, the nurse, the                         anesthesiologist, the anesthetist and the technician                         in the pre-procedure area in the procedure room in the                         endoscopy suite. Mental Status Examination: alert and                         oriented. Airway Examination: normal oropharyngeal                         airway and neck mobility. Respiratory Examination:                         clear to auscultation. CV Examination: normal.  Prophylactic Antibiotics: The patient does not require                         prophylactic antibiotics. Prior Anticoagulants: The                         patient has taken no previous anticoagulant or           antiplatelet agents. ASA Grade Assessment: II - A                         patient with mild systemic disease. After reviewing                         the risks and benefits, the patient was deemed in                         satisfactory condition to undergo the procedure. The                         anesthesia plan was to use monitored anesthesia care                         (MAC). Immediately prior to administration of                         medications, the patient was re-assessed for adequacy                         to receive sedatives. The heart rate, respiratory                         rate, oxygen saturations, blood pressure, adequacy of                         pulmonary ventilation, and response to care were                         monitored throughout the procedure. The physical                         status of the patient was re-assessed after the                         procedure.                        After obtaining informed consent, the colonoscope was                         passed under direct vision. Throughout the procedure,                         the patient's blood pressure, pulse, and oxygen                         saturations were monitored continuously. The                         Colonoscope was  introduced through the anus and                         advanced to the the sigmoid colon to examine a mass.                         This was the intended extent. The colonoscopy was                         extremely difficult due to a partially obstructing                         mass. The patient tolerated the procedure well. The                         quality of the bowel preparation was adequate. Findings:      The perianal and digital rectal examinations were normal. Pertinent       negatives include normal sphincter tone and no palpable rectal lesions.      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       Biopsies were taken with a cold forceps for histology. Area was tattooed       with an injection of Spot (carbon black). Estimated blood loss was       minimal.      Non-bleeding external hemorrhoids were found during retroflexion. The       hemorrhoids were large. 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 Biopsied. Tattooed.                        - Non-bleeding external hemorrhoids. Recommendation:        - Return patient to hospital ward for ongoing care.                        - Resume regular diet today.                        - Continue present medications.                        - Await pathology results.                        - Refer to an oncologist at appointment to be                         scheduled.                        - Refer to a surgeon at appointment to be scheduled. Procedure Code(s):     --- Professional ---                        [phone], 52, Colonoscopy, flexible; with directed  submucosal injection(s), any substance                        45380, 52, Colonoscopy, flexible; with biopsy, single                         or multiple Diagnosis Code(s):     --- Professional ---                        K64.4, Residual hemorrhoidal skin tags                        K56.699, Other intestinal obstruction unspecified as                         to partial versus complete obstruction                        D50.9, Iron deficiency anemia, unspecified CPT copyright 2019 American Medical Association. All rights reserved. The codes documented in this report are preliminary and upon coder review may  be revised to meet current compliance requirements. Dr. Ulyess Mort Lin Landsman MD, MD 11/21/2019 3:59:15 PM This report has been signed electronically. Number of Addenda: 0 Note Initiated On: 11/21/2019 2:43 PM Total Procedure  Duration: 0 hours 8 minutes 37 seconds  Estimated Blood Loss:  Estimated blood loss was minimal.      [facility]

## 2019-11-21 NOTE — Progress Notes (Signed)
Patient ID: Debra [name], female   DOB: February 29, 1966, 54 y.o.   MRN: [id number]  Dr Marius Ditch did EGD --no source of bleeding noted Colonoscopy showed malignant appearing stricture in the sigmoid colon at 25 cm  Consulted DRs Tasia Catchings and Hancock. Secure chat message sent and formal consult placed.

## 2019-11-21 NOTE — Progress Notes (Signed)
[place] [facility] [place] NAME: Debra [name]    MR#:  [id number]  DATE OF BIRTH:  1966/03/02  SUBJECTIVE:  via Spanish interpreter on iPad denies any complaints. No abdominal pain. Taking prep for colonoscopy. No shortness of breath or fever.  Patient says she is not using as much ibuprofen. REVIEW OF SYSTEMS:   Review of Systems  Constitutional: Negative for chills, fever and weight loss.  HENT: Negative for ear discharge, ear pain and nosebleeds.   Eyes: Negative for blurred vision, pain and discharge.  Respiratory: Negative for sputum production, shortness of breath, wheezing and stridor.   Cardiovascular: Negative for chest pain, palpitations, orthopnea and PND.  Gastrointestinal: Negative for abdominal pain, diarrhea, nausea and vomiting.  Genitourinary: Negative for frequency and urgency.  Musculoskeletal: Negative for back pain and joint pain.  Neurological: Positive for weakness. Negative for sensory change, speech change and focal weakness.  Psychiatric/Behavioral: Negative for depression and hallucinations. The patient is not nervous/anxious.    Tolerating Diet:npo Tolerating PT: not needed  DRUG ALLERGIES:  No Known Allergies  VITALS:  Blood pressure (!) 103/52, pulse 66, temperature 98.7 F (37.1 C), temperature source Oral, resp. rate 16, height 5' (1.524 m), weight 97.5 kg, SpO2 97 %.  PHYSICAL EXAMINATION:   Physical Exam  GENERAL:  54 y.o.-year-old patient lying in the bed with no acute distress. obese EYES: Pupils equal, round, reactive to light and accommodation. No scleral icterus.   HEENT: Head atraumatic, normocephalic. Oropharynx and nasopharynx clear.  NECK:  Supple, no jugular venous distention. No thyroid enlargement, no tenderness.  LUNGS: Normal breath sounds bilaterally, no wheezing, rales, rhonchi. No use of accessory muscles of respiration.  CARDIOVASCULAR: S1, S2 normal. No murmurs, rubs, or  gallops.  ABDOMEN: Soft, nontender, nondistended. Bowel sounds present. No organomegaly or mass.  EXTREMITIES: No cyanosis, clubbing or edema b/l.    NEUROLOGIC: Cranial nerves II through XII are intact. No focal Motor or sensory deficits b/l.   PSYCHIATRIC:  patient is alert and oriented x 3.  SKIN: No obvious rash, lesion, or ulcer.   LABORATORY PANEL:  CBC Recent Labs  Lab 11/21/19 1129  WBC 4.9  HGB 8.8*  HCT 30.9*  PLT 176    Chemistries  Recent Labs  Lab 11/20/19 0859 11/20/19 0859 11/21/19 0558  NA 131*   < > 137  K 3.5   < > 3.5  CL 103   < > 108  CO2 20*   < > 22  GLUCOSE 156*   < > 87  BUN 9   < > 7  CREATININE 0.59   < > 0.55  CALCIUM 7.7*   < > 7.7*  MG 2.0  --   --   AST 27  --   --   ALT 15  --   --   ALKPHOS 69  --   --   BILITOT 0.5  --   --    < > = values in this interval not displayed.   Cardiac Enzymes No results for input(s): TROPONINI in the last 168 hours. RADIOLOGY:  DG Chest Portable 1 View  Result Date: 11/20/2019 CLINICAL DATA:  Cough EXAM: PORTABLE CHEST 1 VIEW COMPARISON:  None. FINDINGS: Mild enlargement of the cardiopericardial silhouette. Normal mediastinal contour. No pneumothorax. No pleural effusion. Cephalization of the pulmonary vasculature without overt pulmonary edema. No consolidative airspace disease. IMPRESSION: Mild enlargement of the cardiopericardial silhouette. Cephalization of the pulmonary vasculature without overt  pulmonary edema. No consolidative airspace disease. Electronically Signed   By: Ilona Sorrel M.D.   On: 11/20/2019 09:06   ASSESSMENT AND PLAN:  [name] is a 54 y.o. female with medical history significant of hypothyroidism, who presents with generalized weakness, dark stool. She reports dark stool recently.  Ibuprofen is on her medication list, but she states she does not take this often.Patient does not drink alcohol.  pt was found to have positive COVID-19 Ag, positive FOBT, hemoglobin 6.8 (13.4  on 11/02/2018)  Symptomatic anemia due to possible GIB suspected NSAID related -pt never had endoscopy before. FOBT positive. Dr. Allen Norris of GI is consulted. -Hbg 13.4 (feb 2020)--came in with hgb 6.6-- transfuse 1 units of blood now--8.8 - NPO after MN - cont  IV pantoprazole gtt - Zofran IV for nausea - Avoid NSAIDs and SQ heparin - Maintain IV access (2 large bore IVs if possible). - Monitor closely cbc, transfuse as necessary, if Hgb<7.0  Iron deficiency anemia: Anemia panel showed iron 13, U IBC 361, saturation ratio 4, ferritin 6 -Give 1 dose of Feraheme 510 mg  -start ferrous sulfate supplement -d/w Dr Rogue Bussing will set up pt as out pt for IDA f/u  COVID-19 virus infection: Patient is asymptomatic.  No cough, fever, chills, chest pain.  Oxygen saturation 99% on room air.  Chest x-ray has no infiltration. -Vitamin C and zinc sulfate  Hypothyroidism: -Synthroid  Morbid Obesity: BMI 41.99  Procedures: Family communication : Consults :GI Discharge Disposition :home--pending GI procedure. Likely tomorrow if pt remains stable and does not require further testing CODE STATUS: Full code DVT Prophylaxis :SCD  TOTAL TIME TAKING CARE OF THIS PATIENT: *30* minutes.  >50% time spent on counselling and coordination of care  Note: This dictation was prepared with Dragon dictation along with smaller phrase technology. Any transcriptional errors that result from this process are unintentional.  Fritzi Mandes M.D    [facility]   CC: Primary care physician; [name], MDPatient ID: Debra [name], female   DOB: 1966/04/13, 54 y.o.   MRN: [id number]

## 2019-11-21 NOTE — TOC Progression Note (Signed)
Transition of Care [facility]) - Progression Note    Patient Details  Name: Debra [name] MRN: [id number] Date of Birth: [birth year]  Transition of Care [facility]) CM/SW Contact  Yanet Balliet, Gardiner Rhyme, LCSW Phone Number: 11/21/2019, 12:28 PM  Clinical Narrative:   Attempted to contact pt's daughter-laura no voice mail set up, have text her. Will see if answers and discuss discharge needs.         Expected Discharge Plan and Services                                                 Social Determinants of Health (SDOH) Interventions    Readmission Risk Interventions No flowsheet data found.

## 2019-11-21 NOTE — Consult Note (Addendum)
Reason for Consult: Malignant-appearing colon stricture, likely adenocarcinoma Referring Physician: Fritzi Mandes, MD, hospital medicine  Debra [name] is an 54 y.o. female.  HPI: She presented to the emergency department on November 20, 2019 with complaints of generalized weakness.  She was found to be profoundly anemic with hemoglobin of 6.6.  Gastroenterology was consulted.  Upper and lower endoscopies were performed today.  There was no sign of a source for her anemia on the upper endoscopy, however on colonoscopy, she was found to have a malignant appearing stricture in the sigmoid colon, approximately 25 cm from the anus.  This has been biopsied.  There were no stigmata of recent bleeding.  The performing gastroenterologist did not traverse the stricture with the colonoscope.  General surgery has been consulted in this setting by Dr. 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  The interview was held with the assistance of a Spanish language interpreter  (Ebelyn) via [employer].  Debra [name] states that she is not experiencing any abdominal pain.  She is still having bowel movements, but does endorse significant constipation.  She has noticed some dark discoloration of her stools.  She denies nausea or vomiting.  She was able to tolerate the oral prep without difficulty.  She denies any known family history of colorectal malignancy.  She has never had surgery, abdominal or otherwise.  She is COVID-19 positive by rapid antigen test, but is not symptomatic at this time.  Past Medical History:  Diagnosis Date  . Hypothyroidism   . Thyroid disease     History reviewed. No pertinent surgical history.  No family history on file. Reviewed with the patient, no pertinent positive findings.  Social History:  reports that she has never smoked. She has never used smokeless tobacco. She reports that she does not drink alcohol or use  drugs.  Allergies: No Known Allergies  Medications: I have reviewed the patient's current medications.  Results for orders placed or performed during the hospital encounter of 11/20/19 (from the past 48 hour(s))  CBC with Differential     Status: Abnormal   Collection Time: 11/20/19  8:59 AM  Result Value Ref Range   WBC 4.0 4.0 - 10.5 K/uL   RBC 3.94 3.87 - 5.11 MIL/uL   Hemoglobin 6.8 (L) 12.0 - 15.0 g/dL    Comment: Reticulocyte Hemoglobin testing may be clinically indicated, consider ordering this additional test [id number]    HCT 24.1 (L) 36.0 - 46.0 %   MCV 61.2 (L) 80.0 - 100.0 fL   MCH 17.3 (L) 26.0 - 34.0 pg   MCHC 28.2 (L) 30.0 - 36.0 g/dL   RDW 19.9 (H) 11.5 - 15.5 %   Platelets 197 150 - 400 K/uL   nRBC 0.0 0.0 - 0.2 %   Neutrophils Relative % 66 %   Neutro Abs 2.7 1.7 - 7.7 K/uL   Lymphocytes Relative 24 %   Lymphs Abs 1.0 0.7 - 4.0 K/uL   Monocytes Relative 10 %   Monocytes Absolute 0.4 0.1 - 1.0 K/uL   Eosinophils Relative 0 %   Eosinophils Absolute 0.0 0.0 - 0.5 K/uL   Basophils Relative 0 %   Basophils Absolute 0.0 0.0 - 0.1 K/uL   Immature Granulocytes 0 %   Abs Immature Granulocytes 0.01 0.00 - 0.07 K/uL    Comment: Performed at [facility], [address] [place], [place] [address]  Comprehensive metabolic panel  Status: Abnormal   Collection Time: 11/20/19  8:59 AM  Result Value Ref Range   Sodium 131 (L) 135 - 145 mmol/L   Potassium 3.5 3.5 - 5.1 mmol/L   Chloride 103 98 - 111 mmol/L   CO2 20 (L) 22 - 32 mmol/L   Glucose, Bld 156 (H) 70 - 99 mg/dL    Comment: Glucose reference range applies only to samples taken after fasting for at least 8 hours.   BUN 9 6 - 20 mg/dL   Creatinine, Ser 0.59 0.44 - 1.00 mg/dL   Calcium 7.7 (L) 8.9 - 10.3 mg/dL   Total Protein 6.5 6.5 - 8.1 g/dL   Albumin 3.5 3.5 - 5.0 g/dL   AST 27 15 - 41 U/L   ALT 15 0 - 44 U/L   Alkaline Phosphatase 69 38 - 126 U/L   Total Bilirubin 0.5 0.3 - 1.2 mg/dL   GFR  calc non Af Amer >60 >60 mL/min   GFR calc Af Amer >60 >60 mL/min   Anion gap 8 5 - 15    Comment: Performed at [facility], [address], [place], [place] [address]  Magnesium     Status: None   Collection Time: 11/20/19  8:59 AM  Result Value Ref Range   Magnesium 2.0 1.7 - 2.4 mg/dL    Comment: Performed at [facility], [address] [place], [place] [address]  Urinalysis, Routine w reflex microscopic     Status: Abnormal   Collection Time: 11/20/19  8:59 AM  Result Value Ref Range   Color, Urine YELLOW (A) YELLOW   APPearance CLEAR (A) CLEAR   Specific Gravity, Urine 1.012 1.005 - 1.030   pH 7.0 5.0 - 8.0   Glucose, UA NEGATIVE NEGATIVE mg/dL   Hgb urine dipstick NEGATIVE NEGATIVE   Bilirubin Urine NEGATIVE NEGATIVE   Ketones, ur NEGATIVE NEGATIVE mg/dL   Protein, ur 30 (A) NEGATIVE mg/dL   Nitrite NEGATIVE NEGATIVE   Leukocytes,Ua NEGATIVE NEGATIVE   RBC / HPF 0-5 0 - 5 RBC/hpf   WBC, UA 0-5 0 - 5 WBC/hpf   Bacteria, UA NONE SEEN NONE SEEN   Squamous Epithelial / LPF 0-5 0 - 5   Mucus PRESENT     Comment: Performed at [facility], [address], [place], [place] [address]  ABO/Rh     Status: None   Collection Time: 11/20/19  8:59 AM  Result Value Ref Range   ABO/RH(D)      O POS Performed at [facility], [place], [place] [address]   POC SARS Coronavirus 2 Ag     Status: Abnormal   Collection Time: 11/20/19  9:27 AM  Result Value Ref Range   SARS Coronavirus 2 Ag POSITIVE (A) NEGATIVE    Comment: (NOTE) SARS-CoV-2 antigen PRESENT. Positive results indicate the presence of viral antigens, but clinical correlation with patient history and other diagnostic information is necessary to determine patient infection status.  Positive results do not rule out bacterial infection or co-infection  with other viruses. False positive results are rare but can occur, and confirmatory RT-PCR testing may be appropriate  in some circumstances. The expected result is Negative. Fact Sheet for Patients: PodPark.tn Fact Sheet for Providers: GiftContent.is  This test is not yet approved or cleared by the [place] FDA and  has been authorized for detection and/or diagnosis of SARS-CoV-2 by FDA under an Emergency Use Authorization (EUA).  This EUA will remain in  effect (meaning this test can be used) for the duration of  the COVID-19 declaration under Section 564(b)(1) of the Act, 21 U.S.C. section 360bbb-3(b)(1), unless the a uthorization is terminated or revoked sooner.   Prepare RBC     Status: None   Collection Time: 11/20/19  9:32 AM  Result Value Ref Range   Order Confirmation      ORDER PROCESSED BY BLOOD BANK Performed at [facility], [address] [place], [place] [address]   Iron and TIBC     Status: Abnormal   Collection Time: 11/20/19  9:41 AM  Result Value Ref Range   Iron 13 (L) 28 - 170 ug/dL   TIBC 374 250 - 450 ug/dL   Saturation Ratios 4 (L) 10.4 - 31.8 %   UIBC 361 ug/dL    Comment: Performed at [facility], [address] [place], [place] [address]  Protime-INR     Status: Abnormal   Collection Time: 11/20/19  9:41 AM  Result Value Ref Range   Prothrombin Time 15.4 (H) 11.4 - 15.2 seconds   INR 1.2 0.8 - 1.2    Comment: (NOTE) INR goal varies based on device and disease states. Performed at [facility], [address] [place], [place] [address]   APTT     Status: None   Collection Time: 11/20/19  9:41 AM  Result Value Ref Range   aPTT 33 24 - 36 seconds    Comment: Performed at [facility], [address] [place], [place] [address]  Type and screen [place]     Status: None   Collection Time: 11/20/19  9:41 AM  Result Value Ref Range   ABO/RH(D) O POS    Antibody Screen NEG    Sample Expiration 11/23/2019,2359    Unit Number [id number]     Blood Component Type RED CELLS,LR    Unit division 00    Status of Unit ISSUED,FINAL    Transfusion Status OK TO TRANSFUSE    Crossmatch Result      Compatible Performed at [facility], [address] [place], [place] [address]   Ferritin (Iron Binding Protein)     Status: Abnormal   Collection Time: 11/20/19  9:41 AM  Result Value Ref Range   Ferritin 6 (L) 11 - 307 ng/mL    Comment: Performed at [facility], [address] [place], [place] [address]  Reticulocytes     Status: Abnormal   Collection Time: 11/20/19  9:41 AM  Result Value Ref Range   Retic Ct Pct 0.8 0.4 - 3.1 %   RBC. 3.49 (L) 3.87 - 5.11 MIL/uL   Retic Count, Absolute 27.6 19.0 - 186.0 K/uL   Immature Retic Fract 6.7 2.3 - 15.9 %    Comment: Performed at [facility], [address] [place], [place] [address]  CBC     Status: Abnormal   Collection Time: 11/20/19 11:43 AM  Result Value Ref Range   WBC 3.6 (L) 4.0 - 10.5 K/uL   RBC 3.81 (L) 3.87 - 5.11 MIL/uL   Hemoglobin 6.6 (L) 12.0 - 15.0 g/dL    Comment: Reticulocyte Hemoglobin testing may be clinically indicated, consider ordering this additional test [id number]    HCT 23.6 (L) 36.0 - 46.0 %   MCV 61.9 (L) 80.0 - 100.0 fL   MCH 17.3 (L) 26.0 - 34.0 pg   MCHC 28.0 (L) 30.0 - 36.0 g/dL   RDW 19.6 (H)  11.5 - 15.5 %   Platelets 184 150 - 400 K/uL   nRBC 0.0 0.0 - 0.2 %    Comment: Performed at [facility], [address] [place], [place] [address]  CBC     Status: Abnormal   Collection Time: 11/20/19  6:30 PM  Result Value Ref Range   WBC 3.2 (L) 4.0 - 10.5 K/uL   RBC 4.37 3.87 - 5.11 MIL/uL   Hemoglobin 8.2 (L) 12.0 - 15.0 g/dL    Comment: Reticulocyte Hemoglobin testing may be clinically indicated, consider ordering this additional test [id number]    HCT 28.7 (L) 36.0 - 46.0 %   MCV 65.7 (L) 80.0 - 100.0 fL   MCH 18.8 (L) 26.0 - 34.0 pg   MCHC 28.6 (L) 30.0 - 36.0 g/dL   RDW 23.0 (H) 11.5 - 15.5 %    Platelets 155 150 - 400 K/uL   nRBC 0.0 0.0 - 0.2 %    Comment: Performed at [facility], [address] [place], [place] [address]  HIV Antibody (routine testing w rflx)     Status: None   Collection Time: 11/20/19  6:30 PM  Result Value Ref Range   HIV Screen 4th Generation wRfx NON REACTIVE NON REACTIVE    Comment: Performed at [facility], [address] [place], [place] [address]  CBC     Status: Abnormal   Collection Time: 11/20/19 11:15 PM  Result Value Ref Range   WBC 4.0 4.0 - 10.5 K/uL   RBC 4.33 3.87 - 5.11 MIL/uL   Hemoglobin 8.2 (L) 12.0 - 15.0 g/dL    Comment: Reticulocyte Hemoglobin testing may be clinically indicated, consider ordering this additional test [id number]    HCT 28.4 (L) 36.0 - 46.0 %   MCV 65.6 (L) 80.0 - 100.0 fL   MCH 18.9 (L) 26.0 - 34.0 pg   MCHC 28.9 (L) 30.0 - 36.0 g/dL   RDW 22.8 (H) 11.5 - 15.5 %   Platelets 149 (L) 150 - 400 K/uL   nRBC 0.0 0.0 - 0.2 %    Comment: Performed at [facility], [address] [place], [place] [id number]  Basic metabolic panel     Status: Abnormal   Collection Time: 11/21/19  5:58 AM  Result Value Ref Range   Sodium 137 135 - 145 mmol/L   Potassium 3.5 3.5 - 5.1 mmol/L   Chloride 108 98 - 111 mmol/L   CO2 22 22 - 32 mmol/L   Glucose, Bld 87 70 - 99 mg/dL    Comment: Glucose reference range applies only to samples taken after fasting for at least 8 hours.   BUN 7 6 - 20 mg/dL   Creatinine, Ser 0.55 0.44 - 1.00 mg/dL   Calcium 7.7 (L) 8.9 - 10.3 mg/dL   GFR calc non Af Amer >60 >60 mL/min   GFR calc Af Amer >60 >60 mL/min   Anion gap 7 5 - 15    Comment: Performed at [facility], [address] [place], [place] [address]  CBC     Status: Abnormal   Collection Time: 11/21/19  5:58 AM  Result Value Ref Range   WBC 4.2 4.0 - 10.5 K/uL   RBC 4.47 3.87 - 5.11 MIL/uL   Hemoglobin 8.4 (L) 12.0 - 15.0 g/dL    Comment: Reticulocyte Hemoglobin testing may be clinically  indicated, consider ordering this additional test [id number]    HCT 29.7 (L) 36.0 - 46.0 %  MCV 66.4 (L) 80.0 - 100.0 fL   MCH 18.8 (L) 26.0 - 34.0 pg   MCHC 28.3 (L) 30.0 - 36.0 g/dL   RDW 22.9 (H) 11.5 - 15.5 %   Platelets 156 150 - 400 K/uL   nRBC 0.0 0.0 - 0.2 %    Comment: Performed at [facility], [address] [place], [place] [address]  Glucose, capillary     Status: None   Collection Time: 11/21/19  8:38 AM  Result Value Ref Range   Glucose-Capillary 74 70 - 99 mg/dL    Comment: Glucose reference range applies only to samples taken after fasting for at least 8 hours.  Glucose, capillary     Status: None   Collection Time: 11/21/19 11:20 AM  Result Value Ref Range   Glucose-Capillary 81 70 - 99 mg/dL    Comment: Glucose reference range applies only to samples taken after fasting for at least 8 hours.  CBC     Status: Abnormal   Collection Time: 11/21/19 11:29 AM  Result Value Ref Range   WBC 4.9 4.0 - 10.5 K/uL   RBC 4.70 3.87 - 5.11 MIL/uL   Hemoglobin 8.8 (L) 12.0 - 15.0 g/dL    Comment: Reticulocyte Hemoglobin testing may be clinically indicated, consider ordering this additional test [id number]    HCT 30.9 (L) 36.0 - 46.0 %   MCV 65.7 (L) 80.0 - 100.0 fL   MCH 18.7 (L) 26.0 - 34.0 pg   MCHC 28.5 (L) 30.0 - 36.0 g/dL   RDW 23.5 (H) 11.5 - 15.5 %   Platelets 176 150 - 400 K/uL   nRBC 0.0 0.0 - 0.2 %    Comment: Performed at [facility], [address], [place], [place] [address]    DG Chest Portable 1 View  Result Date: 11/20/2019 CLINICAL DATA:  Cough EXAM: PORTABLE CHEST 1 VIEW COMPARISON:  None. FINDINGS: Mild enlargement of the cardiopericardial silhouette. Normal mediastinal contour. No pneumothorax. No pleural effusion. Cephalization of the pulmonary vasculature without overt pulmonary edema. No consolidative airspace disease. IMPRESSION: Mild enlargement of the cardiopericardial silhouette. Cephalization of the pulmonary vasculature  without overt pulmonary edema. No consolidative airspace disease. Electronically Signed   By: Ilona Sorrel M.D.   On: 11/20/2019 09:06    Review of Systems  Constitutional: Positive for fatigue.  Gastrointestinal: Positive for blood in stool and constipation.  All other systems reviewed and are negative.  Blood pressure 119/60, pulse 87, temperature 98.7 F (37.1 C), temperature source Oral, resp. rate 16, height 5' (1.524 m), weight 97.5 kg, SpO2 95 %.  Body mass index is 41.99 kg/m.  Physical Exam  Constitutional: She is oriented to person, place, and time. She appears well-developed and well-nourished.  HENT:  Head: Normocephalic and atraumatic.  Mouth/Throat: Oropharynx is clear and moist. No oropharyngeal exudate.  Eyes: No scleral icterus.  Conjunctivae are pale.  Slight ptosis on left.  Neck: No tracheal deviation present.  Cardiovascular: Normal rate and regular rhythm.  Respiratory: Effort normal. No stridor. No respiratory distress.  GI: Soft. She exhibits no distension and no mass. There is no abdominal tenderness. There is no rebound and no guarding.  Protuberant, consistent with her morbid obesity.  Genitourinary:    Genitourinary Comments: Deferred   Musculoskeletal:        General: No deformity or edema.  Neurological: She is alert and oriented to person, place, and time.  Skin:  Vitiligo present  Psychiatric: She has a normal mood and  affect. Her behavior is normal.    Assessment/Plan: This is a 54 year old woman who was admitted to the hospital with generalized weakness and found to have severe anemia.  Further evaluation is most suggestive of adenocarcinoma of the colon.  Biopsies are currently pending.  Will get staging CT scans (chest, abdomen, pelvis) CEA ordered.  As she is not obstructed, there is no indication for emergent surgical intervention.  We have time to await full staging prior to proceeding with operative management.  We will continue to  follow.  [name] 11/21/2019, 4:58 PM

## 2019-11-21 NOTE — Anesthesia Preprocedure Evaluation (Signed)
Anesthesia Evaluation  Patient identified by MRN, date of birth, ID band Patient awake    Reviewed: Allergy & Precautions, H&P , NPO status , Patient's Chart, lab work & pertinent test results, reviewed documented beta blocker date and time   Airway Mallampati: II   Neck ROM: full    Dental  (+) Poor Dentition   Pulmonary neg pulmonary ROS,    Pulmonary exam normal        Cardiovascular negative cardio ROS Normal cardiovascular exam Rhythm:regular Rate:Normal     Neuro/Psych negative neurological ROS  negative psych ROS   GI/Hepatic negative GI ROS, Neg liver ROS,   Endo/Other  Hypothyroidism Morbid obesity  Renal/GU negative Renal ROS  negative genitourinary   Musculoskeletal   Abdominal   Peds  Hematology  (+) Blood dyscrasia, anemia ,   Anesthesia Other Findings Past Medical History: No date: Hypothyroidism No date: Thyroid disease History reviewed. No pertinent surgical history. BMI    Body Mass Index: 41.99 kg/m     Reproductive/Obstetrics negative OB ROS                             Anesthesia Physical Anesthesia Plan  ASA: III and emergent  Anesthesia Plan: General   Post-op Pain Management:    Induction:   PONV Risk Score and Plan:   Airway Management Planned:   Additional Equipment:   Intra-op Plan:   Post-operative Plan:   Informed Consent: I have reviewed the patients History and Physical, chart, labs and discussed the procedure including the risks, benefits and alternatives for the proposed anesthesia with the patient or authorized representative who has indicated his/her understanding and acceptance.     Dental Advisory Given  Plan Discussed with: CRNA  Anesthesia Plan Comments:         Anesthesia Quick Evaluation

## 2019-11-21 NOTE — Progress Notes (Signed)
Initial Nutrition Assessment  DOCUMENTATION CODES:   Morbid obesity  INTERVENTION:   RD will add supplements once diet advanced  NUTRITION DIAGNOSIS:   Increased nutrient needs related to catabolic illness(COVID 19) as evidenced by increased estimated needs.  GOAL:   Patient will meet greater than or equal to 90% of their needs  MONITOR:   Diet advancement, Labs, Weight trends, Skin, I & O's  REASON FOR ASSESSMENT:   Malnutrition Screening Tool    ASSESSMENT:   54 y/o female with h/o thyroid disease who is admitted with GIB and COVID 53   Unable to speak with pt as pt with COVID 19 and is spanish speaking. Suspect pt with poor appetite and oral intake pta r/t COVID 19. Pt is currently NPO pending EGD/colonscopy. RD will add supplements once diet is advanced as pt with increased estimated needs. There is no recent weight history documented in chart to determine if any significant weight loss but pt does appear weight stable from previous years.   Medications reviewed and include: ascorbic acid, ferrous sulfate, synthroid, protonix, zinc   Labs reviewed: Iron 13(L), TIBC 374, ferritin 6(L)- 3/14 Hgb 8.4(L), Hct 29.7(L), MCV 66.4(L), MCHC 28.3(L)  Unable to complete Nutrition-Focused physical exam at this time as pt with COVID 19.   Diet Order:   Diet Order            Diet NPO time specified  Diet effective midnight             EDUCATION NEEDS:   No education needs have been identified at this time  Skin:  Skin Assessment: Reviewed RN Assessment  Last BM:  3/14  Height:   Ht Readings from Last 1 Encounters:  11/20/19 5' (1.524 m)    Weight:   Wt Readings from Last 1 Encounters:  11/20/19 97.5 kg    Ideal Body Weight:  45.45 kg  BMI:  Body mass index is 41.99 kg/m.  Estimated Nutritional Needs:   Kcal:  2000-2300kcal/day  Protein:  100-115g/day  Fluid:  >1.4L/day  Koleen Distance MS, RD, LDN Contact information available in Amion

## 2019-11-22 ENCOUNTER — Encounter: Payer: Self-pay | Admitting: *Deleted

## 2019-11-22 ENCOUNTER — Other Ambulatory Visit: Payer: Self-pay | Admitting: Oncology

## 2019-11-22 DIAGNOSIS — D500 Iron deficiency anemia secondary to blood loss (chronic): Secondary | ICD-10-CM | POA: Insufficient documentation

## 2019-11-22 DIAGNOSIS — K6389 Other specified diseases of intestine: Secondary | ICD-10-CM

## 2019-11-22 LAB — CBC
HCT: 30 % — ABNORMAL LOW (ref 36.0–46.0)
Hemoglobin: 8.6 g/dL — ABNORMAL LOW (ref 12.0–15.0)
MCH: 18.9 pg — ABNORMAL LOW (ref 26.0–34.0)
MCHC: 28.7 g/dL — ABNORMAL LOW (ref 30.0–36.0)
MCV: 65.9 fL — ABNORMAL LOW (ref 80.0–100.0)
Platelets: 156 10*3/uL (ref 150–400)
RBC: 4.55 MIL/uL (ref 3.87–5.11)
RDW: 23.3 % — ABNORMAL HIGH (ref 11.5–15.5)
WBC: 4.4 10*3/uL (ref 4.0–10.5)
nRBC: 0 % (ref 0.0–0.2)

## 2019-11-22 LAB — COMPREHENSIVE METABOLIC PANEL
ALT: 15 U/L (ref 0–44)
AST: 25 U/L (ref 15–41)
Albumin: 3.4 g/dL — ABNORMAL LOW (ref 3.5–5.0)
Alkaline Phosphatase: 61 U/L (ref 38–126)
Anion gap: 9 (ref 5–15)
BUN: 5 mg/dL — ABNORMAL LOW (ref 6–20)
CO2: 20 mmol/L — ABNORMAL LOW (ref 22–32)
Calcium: 8.3 mg/dL — ABNORMAL LOW (ref 8.9–10.3)
Chloride: 106 mmol/L (ref 98–111)
Creatinine, Ser: 0.5 mg/dL (ref 0.44–1.00)
GFR calc Af Amer: >60 mL/min (ref >60)
GFR calc non Af Amer: >60 mL/min (ref >60)
Glucose, Bld: 106 mg/dL — ABNORMAL HIGH (ref 70–99)
Potassium: 3.5 mmol/L (ref 3.5–5.1)
Sodium: 135 mmol/L (ref 135–145)
Total Bilirubin: 0.8 mg/dL (ref 0.3–1.2)
Total Protein: 6.4 g/dL — ABNORMAL LOW (ref 6.5–8.1)

## 2019-11-22 LAB — GLUCOSE, CAPILLARY: Glucose-Capillary: 98 mg/dL (ref 70–99)

## 2019-11-22 MED ORDER — FERROUS SULFATE 325 (65 FE) MG PO TABS: 325.0000 mg | ORAL_TABLET | Freq: Two times a day (BID) | ORAL | 3 refills | Status: DC

## 2019-11-22 MED ORDER — SENNOSIDES-DOCUSATE SODIUM 8.6-50 MG PO TABS: 1.0000 | ORAL_TABLET | Freq: Every day | ORAL | 1 refills | Status: DC

## 2019-11-22 NOTE — Discharge Instructions (Signed)
Patient advised to take soft diet

## 2019-11-22 NOTE — Discharge Summary (Signed)
[facility] NAME: Debra [name]    MR#:  [id number]  DATE OF BIRTH:  [birth year]  DATE OF ADMISSION:  11/20/2019 ADMITTING PHYSICIAN: Debra Costa, MD  DATE OF DISCHARGE: 11/22/2019  PRIMARY CARE PHYSICIAN: Debra Lank, MD    ADMISSION DIAGNOSIS:  GIB (gastrointestinal bleeding) [K92.2] Symptomatic anemia [D64.9] COVID-19 virus detected [U07.1]  DISCHARGE DIAGNOSIS:  symptomatic iron deficiency anemia  Sigmoid Colon mass--w/u as out pt SECONDARY DIAGNOSIS:   Past Medical History:  Diagnosis Date  . Hypothyroidism   . Thyroid disease     HOSPITAL COURSE:   Debra [name] a 54 y.o.femalewith medical history significant ofhypothyroidism, who presents with generalized weakness, dark stool.She reports dark stool recently. Ibuprofen is on her medication list, butshestatesshe does not take this often.Patient does not drink alcohol. pt was found to have positive COVID-19 Ag, positive FOBT, hemoglobin 6.8 (13.4 on 11/02/2018)  Symptomatic anemia due to possible slow G.I. bleed in the setting of sigmoid colon mass found on colonoscopy -pt never had endoscopy before. FOBT positive.  -Hbg 13.4 (feb 2020)--came in with hgb 6.6-- transfuse1units of blood now--8.8 - Zofran IV for nausea - Avoid NSAIDs and SQ heparin -underwent G.I. workup. EGD within normal limits. 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 Tasia [name] and will follow-up as outpatient -she was also evaluated by Dr. Celine [name] surgery-- patient currently is not obstructed therefore will see Dr. Celine [name] as outpatient as well -above was discussed with patient's son Debra [name] on the phone he voiced understanding. Son's phone number is [phone]  Iron deficiency anemia:Anemia panel showed iron 13, U IBC 361, saturation ratio 4, ferritin 6 -Give 1 dose of Feraheme510 mg   -now on ferrous sulfate supplement -patient will follow-up with Dr. Tasia [name] as outpatient  COVID-19 virus infection:Patient is asymptomatic. No cough, fever, chills, chest pain. Oxygen saturation 99% on room air. Chest x-ray has no infiltration.  Hypothyroidism: -Synthroid  Morbid Obesity: BMI 41.99  Procedures: EGD and colonoscopy Family communication : son Debra [name] on the phone Consults :GI, surgery, oncology Discharge Disposition :home today  CODE STATUS: Full code DVT Prophylaxis :SCD CONSULTS OBTAINED:  Treatment Team:  Debra Server, MD Debra Maudlin, MD  DRUG ALLERGIES:  No Known Allergies  DISCHARGE MEDICATIONS:   Allergies as of 11/22/2019   No Known Allergies     Medication List    STOP taking these medications   ibuprofen 200 MG tablet Commonly known as: ADVIL     TAKE these medications   ferrous sulfate 325 (65 FE) MG tablet Take 1 tablet (325 mg total) by mouth 2 (two) times daily with a meal.   levothyroxine 150 MCG tablet Commonly known as: SYNTHROID Take 150 mcg by mouth daily before breakfast.   PEPTO-BISMOL PO Take by mouth as needed.       If you experience worsening of your admission symptoms, develop shortness of breath, life threatening emergency, suicidal or homicidal thoughts you must seek medical attention immediately by calling 911 or calling your MD immediately  if symptoms less severe.  You Must read complete instructions/literature along with all the possible adverse reactions/side effects for all the Medicines you take and that have been prescribed to you. Take any new Medicines after you have completely understood and accept all the possible adverse reactions/side effects.   Please note  You were cared for by a hospitalist during your hospital stay.  If you have any questions about your discharge medications or the care you received while you were in the hospital after you are discharged, you can call the unit and asked to speak  with the hospitalist on call if the hospitalist that took care of you is not available. Once you are discharged, your primary care physician will handle any further medical issues. Please note that NO REFILLS for any discharge medications will be authorized once you are discharged, as it is imperative that you return to your primary care physician (or establish a relationship with a primary care physician if you do not have one) for your aftercare needs so that they can reassess your need for medications and monitor your lab values. Today   SUBJECTIVE   Via Spanish interpreter on the phone denies any complaints. No shortness of breath or fever VITAL SIGNS:  Blood pressure (!) 105/54, pulse 75, temperature 98.5 F (36.9 C), resp. rate 20, height 5' (1.524 m), weight 97.5 kg, SpO2 94 %.  I/O:    Intake/Output Summary (Last 24 hours) at 11/22/2019 1230 Last data filed at 11/22/2019 [id number] Gross per 24 hour  Intake 627.58 ml  Output --  Net 627.58 ml    PHYSICAL EXAMINATION:  GENERAL:  54 y.o.-year-old patient lying in the bed with no acute distress. Pallor, obesity EYES: Pupils equal, round, reactive to light and accommodation. No scleral icterus.  HEENT: Head atraumatic, normocephalic. Oropharynx and nasopharynx clear.  NECK:  Supple, no jugular venous distention. No thyroid enlargement, no tenderness.  LUNGS: Normal breath sounds bilaterally, no wheezing, rales,rhonchi or crepitation. No use of accessory muscles of respiration.  CARDIOVASCULAR: S1, S2 normal. No murmurs, rubs, or gallops.  ABDOMEN: Soft, non-tender, non-distended. Bowel sounds present. No organomegaly or mass.  EXTREMITIES: No pedal edema, cyanosis, or clubbing.  NEUROLOGIC: Cranial nerves II through XII are intact. Muscle strength 5/5 in all extremities. Sensation intact. Gait not checked.  PSYCHIATRIC:  patient is alert and oriented x 3.  SKIN: generalized vitiligo  DATA REVIEW:   CBC  Recent Labs  Lab  11/22/19 0704  WBC 4.4  HGB 8.6*  HCT 30.0*  PLT 156    Chemistries  Recent Labs  Lab 11/20/19 0859 11/21/19 0558 11/22/19 0704  NA 131*   < > 135  K 3.5   < > 3.5  CL 103   < > 106  CO2 20*   < > 20*  GLUCOSE 156*   < > 106*  BUN 9   < > 5*  CREATININE 0.59   < > 0.50  CALCIUM 7.7*   < > 8.3*  MG 2.0  --   --   AST 27  --  25  ALT 15  --  15  ALKPHOS 69  --  61  BILITOT 0.5  --  0.8   < > = values in this interval not displayed.    Microbiology Results   No results found for this or any previous visit (from the past 240 hour(s)).  RADIOLOGY:  CT CHEST W CONTRAST  Result Date: 11/21/2019 CLINICAL DATA:  54 year old female with colorectal cancer staging. EXAM: CT CHEST, ABDOMEN, AND PELVIS WITH CONTRAST TECHNIQUE: Multidetector CT imaging of the chest, abdomen and pelvis was performed following the standard protocol during bolus administration of intravenous contrast. CONTRAST:  1102mL OMNIPAQUE IOHEXOL 300 MG/ML  SOLN COMPARISON:  Chest radiograph dated 11/20/2019. FINDINGS: CT CHEST FINDINGS Cardiovascular: There is no cardiomegaly or pericardial effusion. The thoracic aorta is unremarkable.  The origins of the great vessels of the aortic arch appear patent. The central pulmonary arteries are unremarkable. Mediastinum/Nodes: There is no hilar or mediastinal adenopathy. The esophagus is grossly unremarkable. No mediastinal fluid collection. Lungs/Pleura: Bilateral clusters of ground-glass opacities with the largest measuring approximately 3.3 x 1.9 cm in the right upper lobe. Findings are indeterminate but likely represent atypical pneumonia. Clinical correlation and close follow-up recommended. There is no pleural effusion or pneumothorax. Left lung base linear atelectasis/scarring. The central airways are patent. Musculoskeletal: No acute osseous pathology. CT ABDOMEN PELVIS FINDINGS No intra-abdominal free air or free fluid. Hepatobiliary: Probable mild fatty infiltration of the  liver. No intrahepatic biliary ductal dilatation. The gallbladder is unremarkable. Pancreas: Unremarkable. No pancreatic ductal dilatation or surrounding inflammatory changes. Spleen: Normal in size without focal abnormality. Adrenals/Urinary Tract: The adrenal glands are unremarkable. There is no hydronephrosis on either side. There is symmetric enhancement and excretion of contrast by both kidneys. The visualized ureters and urinary bladder appear unremarkable. Stomach/Bowel: There is approximately 5.5 cm segmental area of thickening of the sigmoid colon with soft tissue mass consistent with malignancy and in keeping with colonoscopy findings. There is no bowel obstruction. Small scattered distal colonic diverticula without active inflammatory changes. The appendix is normal. Vascular/Lymphatic: The abdominal aorta and IVC are unremarkable. No portal venous gas. Several small rounded nodular density adjacent to the sigmoid colon mass may represent early extra colonic extension. These lymph nodes measure up to 4 mm. No distant adenopathy. Reproductive: The uterus and ovaries are grossly unremarkable. Other: None Musculoskeletal: Osteopenia with mild degenerative changes of the spine. No acute osseous pathology. IMPRESSION: 1. Segmental area of masslike thickening of the sigmoid colon consistent with malignancy and in keeping with colonoscopy findings. No bowel obstruction. Normal appendix. 2. Several small mildly rounded lymph node adjacent to the sigmoid colon concerning for early extramural involvement. No definite evidence of distant metastatic disease. 3. Bilateral clusters of ground-glass opacities, likely atypical pneumonia. Clinical correlation and close follow-up recommended. Electronically Signed   By: Anner Crete M.D.   On: 11/21/2019 20:52   CT ABDOMEN PELVIS W CONTRAST  Result Date: 11/21/2019 CLINICAL DATA:  54 year old female with colorectal cancer staging. EXAM: CT CHEST, ABDOMEN, AND PELVIS  WITH CONTRAST TECHNIQUE: Multidetector CT imaging of the chest, abdomen and pelvis was performed following the standard protocol during bolus administration of intravenous contrast. CONTRAST:  115mL OMNIPAQUE IOHEXOL 300 MG/ML  SOLN COMPARISON:  Chest radiograph dated 11/20/2019. FINDINGS: CT CHEST FINDINGS Cardiovascular: There is no cardiomegaly or pericardial effusion. The thoracic aorta is unremarkable. The origins of the great vessels of the aortic arch appear patent. The central pulmonary arteries are unremarkable. Mediastinum/Nodes: There is no hilar or mediastinal adenopathy. The esophagus is grossly unremarkable. No mediastinal fluid collection. Lungs/Pleura: Bilateral clusters of ground-glass opacities with the largest measuring approximately 3.3 x 1.9 cm in the right upper lobe. Findings are indeterminate but likely represent atypical pneumonia. Clinical correlation and close follow-up recommended. There is no pleural effusion or pneumothorax. Left lung base linear atelectasis/scarring. The central airways are patent. Musculoskeletal: No acute osseous pathology. CT ABDOMEN PELVIS FINDINGS No intra-abdominal free air or free fluid. Hepatobiliary: Probable mild fatty infiltration of the liver. No intrahepatic biliary ductal dilatation. The gallbladder is unremarkable. Pancreas: Unremarkable. No pancreatic ductal dilatation or surrounding inflammatory changes. Spleen: Normal in size without focal abnormality. Adrenals/Urinary Tract: The adrenal glands are unremarkable. There is no hydronephrosis on either side. There is symmetric enhancement and excretion of contrast by both kidneys.  The visualized ureters and urinary bladder appear unremarkable. Stomach/Bowel: There is approximately 5.5 cm segmental area of thickening of the sigmoid colon with soft tissue mass consistent with malignancy and in keeping with colonoscopy findings. There is no bowel obstruction. Small scattered distal colonic diverticula  without active inflammatory changes. The appendix is normal. Vascular/Lymphatic: The abdominal aorta and IVC are unremarkable. No portal venous gas. Several small rounded nodular density adjacent to the sigmoid colon mass may represent early extra colonic extension. These lymph nodes measure up to 4 mm. No distant adenopathy. Reproductive: The uterus and ovaries are grossly unremarkable. Other: None Musculoskeletal: Osteopenia with mild degenerative changes of the spine. No acute osseous pathology. IMPRESSION: 1. Segmental area of masslike thickening of the sigmoid colon consistent with malignancy and in keeping with colonoscopy findings. No bowel obstruction. Normal appendix. 2. Several small mildly rounded lymph node adjacent to the sigmoid colon concerning for early extramural involvement. No definite evidence of distant metastatic disease. 3. Bilateral clusters of ground-glass opacities, likely atypical pneumonia. Clinical correlation and close follow-up recommended. Electronically Signed   By: Anner Crete M.D.   On: 11/21/2019 20:52     CODE STATUS:     Code Status Orders  (From admission, onward)         Start     Ordered   11/20/19 1324  Full code  Continuous     11/20/19 1324        Code Status History    This patient has a current code status but no historical code status.   Advance Care Planning Activity       TOTAL TIME TAKING CARE OF THIS PATIENT: **40* minutes.    Fritzi Mandes M.D  [facility]    CC: Primary care physician; Debra Lank, MD

## 2019-11-22 NOTE — TOC Progression Note (Signed)
Transition of Care [facility]) - Progression Note    Patient Details  Name: Debra [name] MRN: [id number] Date of Birth: 07/19/1966  [facility] CM/SW Contact  Oreoluwa Aigner, Gardiner Rhyme, LCSW Phone Number: 11/22/2019, 12:38 PM  Clinical Narrative:   Pt is connected with [facility] for follow up-PCP. She has no equipment and home health needs. Her son [phone] is main contact. She has a Engineer, site and husband who are at home and can assist her. Pt was independent prior to admission and other family members drive. [facility] can assist with medications she is on. No other needs.         Expected Discharge Plan and Services           Expected Discharge Date: 11/22/19                                     Social Determinants of Health (SDOH) Interventions    Readmission Risk Interventions No flowsheet data found.

## 2019-11-22 NOTE — Progress Notes (Signed)
Interpreter used  

## 2019-11-23 ENCOUNTER — Other Ambulatory Visit: Payer: Self-pay | Admitting: General Surgery

## 2019-11-23 LAB — CEA: CEA: 7.9 ng/mL — ABNORMAL HIGH (ref 0.0–4.7)

## 2019-11-26 ENCOUNTER — Other Ambulatory Visit: Payer: Self-pay | Admitting: Physician Assistant

## 2019-11-26 DIAGNOSIS — U071 COVID-19: Secondary | ICD-10-CM

## 2019-11-26 MED ORDER — SODIUM CHLORIDE 0.9 % IV SOLN
700.0000 mg | Freq: Once | INTRAVENOUS | Status: AC
  Administered 2019-11-27: 700 mg via INTRAVENOUS
  Filled 2019-11-26: qty 700

## 2019-11-26 NOTE — Progress Notes (Signed)
  I connected by phone with Debra [name] on 11/26/2019 at 4:56 PM to discuss the potential use of an new treatment for mild to moderate COVID-19 viral infection in non-hospitalized patients.  This patient is a 54 y.o. female that meets the FDA criteria for Emergency Use Authorization of bamlanivimab or casirivimab\imdevimab.  Has a (+) direct SARS-CoV-2 viral test result  Has mild or moderate COVID-19   Is ? 56 years of age and weighs ? 40 kg  Is NOT hospitalized due to COVID-19  Is NOT requiring oxygen therapy or requiring an increase in baseline oxygen flow rate due to COVID-19  Is within 10 days of symptom onset  Has at least one of the high risk factor(s) for progression to severe COVID-19 and/or hospitalization as defined in EUA.  Specific high risk criteria : BMI >/= 35   I have spoken and communicated the following to the patient or parent/caregiver:  1. FDA has authorized the emergency use of bamlanivimab and casirivimab\imdevimab for the treatment of mild to moderate COVID-19 in adults and pediatric patients with positive results of direct SARS-CoV-2 viral testing who are 57 years of age and older weighing at least 40 kg, and who are at high risk for progressing to severe COVID-19 and/or hospitalization.  2. The significant known and potential risks and benefits of bamlanivimab and casirivimab\imdevimab, and the extent to which such potential risks and benefits are unknown.  3. Information on available alternative treatments and the risks and benefits of those alternatives, including clinical trials.  4. Patients treated with bamlanivimab and casirivimab\imdevimab should continue to self-isolate and use infection control measures (e.g., wear mask, isolate, social distance, avoid sharing personal items, clean and disinfect "high touch" surfaces, and frequent handwashing) according to CDC guidelines.   5. The patient or parent/caregiver has the option to accept or refuse  bamlanivimab or casirivimab\imdevimab .  After reviewing this information with the patient, The patient agreed to proceed with receiving the bamlanimivab infusion and will be provided a copy of the Fact sheet prior to receiving the infusion.   Sx onset 3/17. Set up for infusion at 11/27/19 @ 9:30am. Directions given . Angelena Form 11/26/2019 4:56 PM

## 2019-11-27 ENCOUNTER — Ambulatory Visit (HOSPITAL_COMMUNITY)
Admission: RE | Admit: 2019-11-27 | Discharge: 2019-11-27 | Disposition: A | Discharge status: Home / Self Care | Payer: HRSA Program | Source: Ambulatory Visit | Attending: Pulmonary Disease | Admitting: Pulmonary Disease

## 2019-11-27 DIAGNOSIS — U071 COVID-19: Secondary | ICD-10-CM | POA: Diagnosis present

## 2019-11-27 MED ORDER — EPINEPHRINE 0.3 MG/0.3ML IJ SOAJ: 0.3000 mg | Freq: Once | INTRAMUSCULAR | Status: DC | PRN

## 2019-11-27 MED ORDER — SODIUM CHLORIDE 0.9 % IV SOLN: INTRAVENOUS | Status: DC | PRN

## 2019-11-27 MED ORDER — FAMOTIDINE IN NACL 20-0.9 MG/50ML-% IV SOLN: 20.0000 mg | Freq: Once | INTRAVENOUS | Status: DC | PRN

## 2019-11-27 MED ORDER — DIPHENHYDRAMINE HCL 50 MG/ML IJ SOLN: 50.0000 mg | Freq: Once | INTRAMUSCULAR | Status: DC | PRN

## 2019-11-27 MED ORDER — ALBUTEROL SULFATE HFA 108 (90 BASE) MCG/ACT IN AERS: 2.0000 | INHALATION_SPRAY | Freq: Once | RESPIRATORY_TRACT | Status: DC | PRN

## 2019-11-27 MED ORDER — ONDANSETRON HCL 4 MG/2ML IJ SOLN
4.0000 mg | Freq: Once | INTRAMUSCULAR | Status: AC
  Administered 2019-11-27: 4 mg via INTRAVENOUS
  Filled 2019-11-27: qty 2

## 2019-11-27 MED ORDER — METHYLPREDNISOLONE SODIUM SUCC 125 MG IJ SOLR: 125.0000 mg | Freq: Once | INTRAMUSCULAR | Status: DC | PRN

## 2019-11-27 NOTE — Discharge Instructions (Signed)
COVID-34 COVID-19 El COVID-19 es una infeccin respiratoria causada por un virus llamado coronavirus tipo 2 causante del sndrome respiratorio agudo grave (SARS-CoV-2). La enfermedad tambin se conoce como enfermedad por coronavirus o nuevo coronavirus. En algunas personas, el virus puede no ocasionar sntomas. En otras, puede producir una infeccin grave. La infeccin puede empeorar rpidamente y causar complicaciones, como:  Neumona o infeccin en los pulmones.  Sndrome de dificultad respiratoria aguda o SDRA. Es una afeccin que se caracteriza por la acumulacin de lquido en los pulmones, que impide que los pulmones se llenen de aire y pasen oxgeno a Herbalist.  Insuficiencia respiratoria aguda. Es una afeccin que se caracteriza porque no pasa suficiente oxgeno de los pulmones al cuerpo o porque el dixido de carbono no pasa de los pulmones hacia afuera del cuerpo.  Sepsis o choque sptico. Se trata de una reaccin grave del cuerpo ante una infeccin.  Problemas de coagulacin.  Infecciones secundarias debido a bacterias u hongos.  Falla de rganos. Ocurre cuando los rganos del cuerpo dejan de funcionar. El virus que causa el COVID-19 es contagioso. Esto significa que puede transmitirse de [place] persona a otra a travs de las gotitas de saliva de la tos y de los estornudos (secreciones respiratorias). Cules son las causas? Esta enfermedad es causada por un virus. Usted puede contagiarse con este virus:  Al inspirar las gotitas de una persona infectada. Las [employer] pueden diseminarse cuando una persona respira, habla, canta, tose o estornuda.  Al tocar algo, como una mesa o el picaportes de [place], que estuvo expuesto al virus (contaminado) y luego tocarse la boca, nariz o los ojos. Qu incrementa el riesgo? Riesgo de infeccin Es ms probable que se infecte con este virus si:  Se encuentra a Physiological scientist a 6 pies (2 metros) de Arts administrator con COVID-19.  Cuida o  vive con una persona infectada con COVID-19.  Pasa tiempo en espacios interiores repletos de gente o vive en viviendas compartidas. Riesgo de enfermedad grave Es ms probable que se enferme gravemente por el virus si:  Tiene 48aos o ms. Cuanto mayor sea su edad, mayor ser el riesgo de tener una enfermedad grave.  Vive en un hogar de ancianos o centro de atencin a Barrister's clerk.  Tiene cncer.  Tiene una enfermedad prolongada (crnica), como las siguientes: ? Enfermedad pulmonar crnica, que incluye la enfermedad pulmonar obstructiva crnica o asma. ? Una enfermedad crnica que disminuye la capacidad del cuerpo para combatir las infecciones (immunocomprometido). ? Enfermedad cardaca, que incluye insuficiencia cardaca, una afeccin que se caracteriza porque las arterias que llegan al corazn se Investment banker, corporate u obstruyen (arteriopata coronaria) o una enfermedad que provoca que el msculo cardaco se engrose, se debilite o endurezca (miocardiopata). ? Diabetes. ? Enfermedad renal crnica. ? Anemia drepanoctica, una enfermedad que se caracteriza porque los glbulos rojos tienen una forma anormal de "hoz". ? Enfermedad heptica.  Es obeso. Cules son los signos o sntomas? Los sntomas de esta afeccin pueden ser de leves a graves. Los sntomas pueden aparecer en el trmino de 2 a [address] despus de haber estado expuesto al virus. Incluyen los siguientes:  Fiebre o escalofros.  Tos.  Dificultad para respirar.  Dolores de [place], dolores en el cuerpo o dolores musculares.  Secrecin o congestin nasal.  Dolor de garganta.  Nueva prdida del sentido del gusto o del olfato. Algunas personas tambin pueden [employer], como nuseas, vmitos o diarrea. Es posible que otras personas no tengan sntomas de COVID-19.  Cmo se diagnostica? Esta afeccin se puede diagnosticar en funcin de lo siguiente:  Sus signos y sntomas, especialmente si: ? Vive en una zona  donde hay un brote de COVID-19. ? Viaj recientemente a una zona donde el virus es frecuente. ? Cuida o vive con Ardelia Mems persona a quien se le diagnostic COVID-19. ? [address] expuesto a una persona a la que se le diagnostic COVID-19.  Un examen fsico.  Anlisis de laboratorio que pueden incluir: ? Tomar una muestra de lquido de la parte posterior de la nariz y la garganta (lquido nasofarngeo), la nariz o la garganta, con un hisopo. ? Una muestra de mucosidad de los pulmones (esputo). ? Anlisis de Agency.  Los estudios de diagnstico por imgenes pueden incluir radiografas, exploracin por tomografa computarizada (TC) o ecografa. Cmo se trata? En este momento, no hay ningn medicamento para tratar el COVID-19. Los medicamentos para tratar otras enfermedades se usan a modo de ensayo para comprobar si son eficaces contra el COVID-19. El mdico le informar sobre las maneras de tratar los sntomas. En la [employer], la infeccin es leve y puede controlarse en el hogar con reposo, lquidos y medicamentos de [place]. El tratamiento para una infeccin grave suele realizarse en la unidad de cuidados intensivos (UCI) de un hospital. Puede incluir uno o ms de los siguientes. Estos tratamientos se administran hasta que los sntomas mejoran.  Recibir lquidos y [employer] a travs de una va intravenosa.  Oxgeno complementario. Para administrar oxgeno extra, se [place] un tubo en la [place]an [place], una mascarilla o una campana de oxgeno.  Colocarlo para que se recueste boca abajo (decbito prono). Esto facilita el ingreso de oxgeno a los pulmones.  Uso continuo de [place] de presin positiva de las vas areas (CPAP) o de presin positiva de las vas areas de dos niveles (BPAP). Este tratamiento utiliza una presin de aire leve para Theatre manager las vas respiratorias abiertas. Un tubo conectado a un motor administra oxgeno al cuerpo.  Respirador. Este tratamiento mueve el aire  dentro y fuera de los pulmones mediante el uso de un tubo que se coloca en la trquea.  Traqueostoma. En este procedimiento se hace un orificio en el cuello para insertar un tubo de respiracin.  Oxigenacin por membrana extracorprea (OMEC). En este procedimiento, los pulmones tienen la posibilidad de recuperarse al asumir las funciones del corazn y los pulmones. Suministra oxgeno al cuerpo y elimina el dixido de carbono. Siga estas instrucciones en su casa: Estilo de vida  Si est enfermo, qudese en su casa, excepto para obtener atencin mdica. El mdico le indicar cunto tiempo debe quedarse en casa. Llame al mdico antes de buscar atencin mdica.  Haga reposo en su casa como se lo haya indicado el mdico.  No consuma ningn producto que contenga nicotina o tabaco, como cigarrillos, cigarrillos electrnicos y tabaco de Higher education careers adviser. Si necesita ayuda para dejar de fumar, consulte al mdico.  Retome sus actividades normales segn lo indicado por el mdico. Pregntele al mdico qu actividades son seguras para usted. Instrucciones generales  Use los medicamentos de venta libre y los recetados solamente como se lo haya indicado el mdico.  Beba suficiente lquido como para Theatre manager la orina de color amarillo plido.  Concurra a todas las visitas de seguimiento como se lo haya indicado el mdico. Esto es importante. Cmo se evita?  No hay ninguna vacuna que ayude a prevenir la infeccin por COVID-19. Sin embargo, hay medidas que puede tomar para protegerse y Museum/gallery curator a  otras personas de [place] virus. Para protegerse:   No viaje a zonas donde el COVID-19 sea un riesgo. Las zonas donde se informa la presencia del COVID-19 cambian con frecuencia. Para identificar las zonas de alto riesgo y las restricciones de viaje, consulte el sitio web de viajes de Garment/textile technologist for [employer] and Prevention Librarian, academic) (Centros para el Control y la Prevencin de Arboriculturist):  FatFares.com.br  Si vive o debe viajar a una zona donde el COVID-19 es un riesgo, tome precauciones para evitar infecciones. ? Aljese de [place]. ? Lvese las manos frecuentemente con agua y [place]. Use desinfectante para manos con alcohol si no dispone de [place] y [place]. ? Evite tocarse la boca, la cara, los ojos o la [place]. ? Evite salir de su casa, siga las indicaciones de su estado y [place] autoridades sanitarias locales. ? Si debe salir de su casa, use un barbijo de tela o una mascarilla facial. Asegrese de que le cubra la nariz y la boca. ? Evite los espacios interiores repletos de gente. Mantenga una distancia de al menos 6 pies (2 metros) de Producer, television/film/video. ? Desinfecte los objetos y las superficies que se tocan con frecuencia todos [place]. Pueden incluir:  Encimeras y [place].  Picaportes e interruptores de luz.  Lavabos, fregaderos y grifos.  Aparatos electrnicos tales como telfonos, controles remotos, teclados, computadoras y tabletas. Cmo proteger a los dems: Si tiene sntomas de COVID-19, tome medidas para evitar que el virus se propague a Producer, television/film/video.  Si cree que tiene una infeccin por COVID-19, comunquese de inmediato con su mdico. Informe al equipo de atencin mdica que cree que puede tener una infeccin por el COVID-19.  Qudese en su casa. Salga de su casa solo para buscar atencin mdica. No utilice el transporte pblico.  No viaje mientras est enfermo.  Lvese las manos frecuentemente con agua y [place]. Usar desinfectante para manos con alcohol si no dispone de [place] y [place].  Mantngase alejado de quienes vivan con usted. Permita que los miembros de la familia sanos cuiden a los nios y las [place], si es posible. Si tiene que cuidar a los nios o las mascotas, lvese las manos con frecuencia y use un barbijo. Si es posible, permanezca en su habitacin, separado de los dems. Utilice un  bao diferente.  Asegrese de que todas las personas que viven en su casa se laven bien las manos y con frecuencia.  Tosa o estornude en un pauelo de papel o sobre su manga o codo. No tosa o estornude al aire ni se cubra la boca o la nariz con la [place].  Use un barbijo de tela o una mascarilla facial. Asegrese de que le cubra la nariz y la boca. Dnde buscar ms informacin  Centers for Disease Control and Prevention (Centros para el Control y la Prevencin de Arboriculturist): PurpleGadgets.be  World Health Organization (Organizacin Mundial de la Salud): https://www.castaneda.info/ Comunquese con un mdico si:  Vive o ha viajado a una zona donde el COVID-19 es un riesgo y tiene sntomas de infeccin.  Ha tenido contacto con alguien que tiene COVID-19 y usted tiene sntomas de infeccin. Solicite ayuda inmediatamente si:  Tiene dificultad para respirar.  Siente dolor u opresin en el pecho.  Experimenta confusin.  [place] uas de los dedos y los labios de color [place].  Tiene dificultad para despertarse.  Los sntomas empeoran. Estos sntomas pueden representar un problema grave que constituye Engineer, maintenance (IT). No espere  a ver si los sntomas desaparecen. Solicite atencin mdica de inmediato. Comunquese con el servicio de emergencias de su localidad (911 en los [place]). No conduzca por sus propios medios hasta el hospital. Informe al personal mdico de emergencias si cree que tiene COVID-19. Resumen  El COVID-19 es una infeccin respiratoria causada por un virus. Tambin se conoce como enfermedad por coronavirus o nuevo coronavirus. Puede causar infecciones graves, como neumona, sndrome de dificultad respiratoria aguda, insuficiencia respiratoria aguda o sepsis.  El virus que causa el COVID-19 es contagioso. Esto significa que puede transmitirse de una persona a otra a travs de las gotitas que se despiden al respirar, hablar,  cantar, toser y estornudar.  Es ms probable que desarrolle una enfermedad grave si tiene 50 aos o ms, tiene el sistema inmunitario dbil, vive en un hogar de ancianos o tiene una enfermedad crnica.  No hay ningn medicamento para tratar el COVID-19. El mdico le informar sobre las maneras de tratar los sntomas.  Tome medidas para protegerse y proteger a los dems contra las infecciones. Lvese las manos con frecuencia y desinfecte los objetos y las superficies que se tocan con frecuencia todos los das. Mantngase alejado de las personas que estn enfermas y use un barbijo si est enfermo. Esta informacin no tiene como fin reemplazar el consejo del mdico. Asegrese de hacerle al mdico cualquier pregunta que tenga. Document Revised: 06/30/2019 Document Reviewed: 10/23/2018 Elsevier Patient Education  2020 Elsevier Inc. What types of side effects do monoclonal antibody drugs cause?  Common side effects  In general, the more common side effects caused by monoclonal antibody drugs include: . Allergic reactions, such as hives or itching . Flu-like signs and symptoms, including chills, fatigue, fever, and muscle aches and pains . Nausea, vomiting . Diarrhea . Skin rashes . Low blood pressure   The CDC is recommending patients who receive monoclonal antibody treatments wait at least 90 days before being vaccinated.  Currently, there are no data on the safety and efficacy of mRNA COVID-19 vaccines in persons who received monoclonal antibodies or convalescent plasma as part of COVID-19 treatment. Based on the estimated half-life of such therapies as well as evidence suggesting that reinfection is uncommon in the 90 days after initial infection, vaccination should be deferred for at least 90 days, as a precautionary measure until additional information becomes available, to avoid interference of the antibody treatment with vaccine-induced immune responses.  

## 2019-11-27 NOTE — Progress Notes (Signed)
  Diagnosis: COVID-19  Physician: Dr. Joya Gaskins  Procedure: [facility] Med: bamlanivimab infusion - Provided patient with bamlanimivab fact sheet for patients, parents and caregivers prior to infusion.  Complications: No immediate complications noted.  Discharge: Discharged home   Debra [name] 11/27/2019

## 2019-11-28 ENCOUNTER — Encounter: Payer: Self-pay | Admitting: Oncology

## 2019-11-28 ENCOUNTER — Inpatient Hospital Stay: Payer: MEDICAID | Attending: Oncology | Admitting: Oncology

## 2019-11-28 ENCOUNTER — Other Ambulatory Visit: Payer: Self-pay

## 2019-11-28 DIAGNOSIS — D500 Iron deficiency anemia secondary to blood loss (chronic): Secondary | ICD-10-CM

## 2019-11-28 DIAGNOSIS — R5383 Other fatigue: Secondary | ICD-10-CM | POA: Insufficient documentation

## 2019-11-28 DIAGNOSIS — Z79899 Other long term (current) drug therapy: Secondary | ICD-10-CM | POA: Insufficient documentation

## 2019-11-28 DIAGNOSIS — U071 COVID-19: Secondary | ICD-10-CM

## 2019-11-28 DIAGNOSIS — K6389 Other specified diseases of intestine: Secondary | ICD-10-CM

## 2019-11-28 NOTE — Progress Notes (Signed)
Patient contacted for Mychart viist. Pt continues on quarantine due to COVID-19 infection. Pt reports feeling better. Pt does not speak english, but her son ([place] speaking) is with her. No personal or family history of cancer.

## 2019-11-28 NOTE — Progress Notes (Signed)
HEMATOLOGY-ONCOLOGY TeleHEALTH VISIT PROGRESS NOTE  I connected with Debra [name] on 11/28/19 at 10:45 AM EDT by video enabled telemedicine visit and verified that I am speaking with the correct person using two identifiers. I discussed the limitations, risks, security and privacy concerns of performing an evaluation and management service by telemedicine and the availability of in-person appointments. I also discussed with the patient that there may be a patient responsible charge related to this service. The patient expressed understanding and agreed to proceed.   Other persons participating in the visit and their role in the encounter:  None  Patient's location: Home  Provider's location: office Chief Complaint: post hospitalization follow up, colon mass, severe iron deficiency anemia.    INTERVAL HISTORY Debra [name] is a 54 y.o. female who has above history reviewed by me today presents for follow up visit for management of colon mass, severe IDA Problems and complaints are listed below:  She was tested COVID positive, currently quarantined at home. Denies any breathing difficulties.  She has severe IDA, s/p PRBC transfusion and Venofer 200mg  x 1.  Today she feels well, still tried. She take oral iron supplementation.  Denies any bloody or black stool. No difficulty in passing bowel movements.    Review of Systems  Constitutional: Positive for fatigue and unexpected weight change. Negative for appetite change, chills and fever.  HENT:   Negative for hearing loss and voice change.   Eyes: Negative for eye problems.  Respiratory: Negative for chest tightness and cough.   Cardiovascular: Negative for chest pain.  Gastrointestinal: Negative for abdominal distention, abdominal pain, blood in stool and rectal pain.  Endocrine: Negative for hot flashes.  Genitourinary: Negative for difficulty urinating and frequency.   Musculoskeletal: Negative for arthralgias.  Skin:  Negative for itching and rash.  Neurological: Negative for extremity weakness.  Hematological: Negative for adenopathy.  Psychiatric/Behavioral: Negative for confusion.    Past Medical History:  Diagnosis Date  . Hypothyroidism   . Thyroid disease    Past Surgical History:  Procedure Laterality Date  . COLONOSCOPY WITH PROPOFOL N/A 11/21/2019   Procedure: COLONOSCOPY WITH PROPOFOL;  Surgeon: Lin Landsman, MD;  Location[facility] ENDOSCOPY;  Service: Gastroenterology;  Laterality: N/A;  . ESOPHAGOGASTRODUODENOSCOPY (EGD) WITH PROPOFOL N/A 11/21/2019   Procedure: ESOPHAGOGASTRODUODENOSCOPY (EGD) WITH PROPOFOL;  Surgeon: Lin Landsman, MD;  Location: [facility] ENDOSCOPY;  Service: Gastroenterology;  Laterality: N/A;    Family History  Problem Relation Age of Onset  . Diabetes Sister     Social History   Socioeconomic History  . Marital status: Married    Spouse name: Not on file  . Number of children: Not on file  . Years of education: Not on file  . Highest education level: Not on file  Occupational History  . Occupation: Factory  Tobacco Use  . Smoking status: Never Smoker  . Smokeless tobacco: Never Used  Substance and Sexual Activity  . Alcohol use: Never  . Drug use: Never  . Sexual activity: Not on file  Other Topics Concern  . Not on file  Social History Narrative  . Not on file   Social Determinants of Health   Financial Resource Strain:   . Difficulty of Paying Living Expenses:   Food Insecurity:   . Worried About Charity fundraiser in the Last Year:   . Arboriculturist in the Last Year:   Transportation Needs:   . Film/video editor (Medical):   Marland Kitchen Lack of  Transportation (Non-Medical):   Physical Activity:   . Days of Exercise per Week:   . Minutes of Exercise per Session:   Stress:   . Feeling of Stress :   Social Connections:   . Frequency of Communication with Friends and Family:   . Frequency of Social Gatherings with Friends and Family:    . Attends Religious Services:   . Active Member of Clubs or Organizations:   . Attends Archivist Meetings:   Marland Kitchen Marital Status:   Intimate Partner Violence:   . Fear of Current or Ex-Partner:   . Emotionally Abused:   Marland Kitchen Physically Abused:   . Sexually Abused:     Current Outpatient Medications on File Prior to Visit  Medication Sig Dispense Refill  . Bismuth Subsalicylate (PEPTO-BISMOL PO) Take by mouth as needed.    . ferrous sulfate 325 (65 FE) MG tablet Take 1 tablet (325 mg total) by mouth 2 (two) times daily with a meal. 60 tablet 3  . levothyroxine (SYNTHROID) 150 MCG tablet Take 150 mcg by mouth daily before breakfast.    . senna-docusate (SENOKOT-S) 8.6-50 MG tablet Take 1 tablet by mouth at bedtime. 30 tablet 1   No current facility-administered medications on file prior to visit.    No Known Allergies     Observations/Objective: There were no vitals filed for this visit. There is no height or weight on file to calculate BMI.  Physical Exam  Constitutional: No distress.  Neurological: She is alert.    CBC    Component Value Date/Time   WBC 4.4 11/22/2019 0704   RBC 4.55 11/22/2019 0704   HGB 8.6 (L) 11/22/2019 0704   HCT 30.0 (L) 11/22/2019 0704   PLT 156 11/22/2019 0704   MCV 65.9 (L) 11/22/2019 0704   MCH 18.9 (L) 11/22/2019 0704   MCHC 28.7 (L) 11/22/2019 0704   RDW 23.3 (H) 11/22/2019 0704   LYMPHSABS 1.0 11/20/2019 0859   MONOABS 0.4 11/20/2019 0859   EOSABS 0.0 11/20/2019 0859   BASOSABS 0.0 11/20/2019 0859    CMP     Component Value Date/Time   NA 135 11/22/2019 0704   K 3.5 11/22/2019 0704   CL 106 11/22/2019 0704   CO2 20 (L) 11/22/2019 0704   GLUCOSE 106 (H) 11/22/2019 0704   BUN 5 (L) 11/22/2019 0704   CREATININE 0.50 11/22/2019 0704   CALCIUM 8.3 (L) 11/22/2019 0704   PROT 6.4 (L) 11/22/2019 0704   ALBUMIN 3.4 (L) 11/22/2019 0704   AST 25 11/22/2019 0704   ALT 15 11/22/2019 0704   ALKPHOS 61 11/22/2019 0704   BILITOT 0.8  11/22/2019 0704   GFRNONAA >60 11/22/2019 0704   GFRAA >60 11/22/2019 0704    Pre operative CEA 7.9  Assessment and Plan: 1. Colonic mass   2. Iron deficiency anemia due to chronic blood loss   3. COVID-19 virus detected     Sigmoid colon mass/malignant appearing stricture, s/p biopsy.  Discussed with pathologist Dr.Jones. Preliminary pathology showed no malignant cells, still awaiting additional IHC to confirm.  I discussed with patient that the biopsy may not reflect her disease. Sticture is severe and scope can not pass through. I recommend obtaining virtual colonoscopy. - She has appointment with Surgery Dr.Cannon next week.   Iron deficiency anemia.  Continue oral ferrous sulfate 325mg  BID. Recommend patient to continue Senokot to keep stool soft.   COVID 19 positive, mild symptoms. Obtain CBC iron tibc ferritin in 2 weeks.  Home quarantine.  Follow Up Instructions: Follow up to be determined.    I discussed the assessment and treatment plan with the patient. The patient was provided an opportunity to ask questions and all were answered. The patient agreed with the plan and demonstrated an understanding of the instructions.  The patient was advised to call back or seek an in-person evaluation if the symptoms worsen or if the condition fails to improve as anticipated.    Earlie Server, MD 11/28/2019 9:23 PM

## 2019-11-29 LAB — SURGICAL PATHOLOGY

## 2019-12-06 ENCOUNTER — Ambulatory Visit: Payer: Self-pay | Admitting: General Surgery

## 2019-12-12 ENCOUNTER — Inpatient Hospital Stay: Payer: Self-pay | Attending: Oncology

## 2019-12-12 ENCOUNTER — Other Ambulatory Visit: Payer: Self-pay

## 2019-12-12 DIAGNOSIS — K6389 Other specified diseases of intestine: Secondary | ICD-10-CM

## 2019-12-12 DIAGNOSIS — D509 Iron deficiency anemia, unspecified: Secondary | ICD-10-CM | POA: Insufficient documentation

## 2019-12-12 LAB — CBC WITH DIFFERENTIAL/PLATELET
Abs Immature Granulocytes: 0.02 10*3/uL (ref 0.00–0.07)
Basophils Absolute: 0 10*3/uL (ref 0.0–0.1)
Basophils Relative: 1 %
Eosinophils Absolute: 0.1 10*3/uL (ref 0.0–0.5)
Eosinophils Relative: 2 %
HCT: 34.3 % — ABNORMAL LOW (ref 36.0–46.0)
Hemoglobin: 10.6 g/dL — ABNORMAL LOW (ref 12.0–15.0)
Immature Granulocytes: 1 %
Lymphocytes Relative: 29 %
Lymphs Abs: 1.3 10*3/uL (ref 0.7–4.0)
MCH: 22.9 pg — ABNORMAL LOW (ref 26.0–34.0)
MCHC: 31.2 g/dL (ref 30.0–36.0)
MCV: 73.3 fL — ABNORMAL LOW (ref 80.0–100.0)
Monocytes Absolute: 0.5 10*3/uL (ref 0.1–1.0)
Monocytes Relative: 11 %
Neutro Abs: 2.6 10*3/uL (ref 1.7–7.7)
Neutrophils Relative %: 56 %
Platelets: 178 10*3/uL (ref 150–400)
RBC: 4.68 MIL/uL (ref 3.87–5.11)
Smear Review: NORMAL
WBC: 4.4 10*3/uL (ref 4.0–10.5)
nRBC: 0 % (ref 0.0–0.2)

## 2019-12-12 LAB — IRON AND TIBC
Iron: 102 ug/dL (ref 28–170)
Saturation Ratios: 40 % — ABNORMAL HIGH (ref 10.4–31.8)
TIBC: 258 ug/dL (ref 250–450)
UIBC: 156 ug/dL

## 2019-12-12 LAB — FERRITIN: Ferritin: 145 ng/mL (ref 11–307)

## 2019-12-15 ENCOUNTER — Other Ambulatory Visit: Payer: Self-pay | Admitting: Oncology

## 2019-12-15 ENCOUNTER — Ambulatory Visit
Admission: RE | Admit: 2019-12-15 | Discharge: 2019-12-15 | Disposition: A | Discharge status: Home / Self Care | Payer: Self-pay | Source: Ambulatory Visit | Attending: Oncology | Admitting: Oncology

## 2019-12-15 DIAGNOSIS — K6389 Other specified diseases of intestine: Secondary | ICD-10-CM

## 2019-12-19 ENCOUNTER — Telehealth: Payer: Self-pay | Admitting: Emergency Medicine

## 2019-12-19 NOTE — Telephone Encounter (Signed)
Pt appt cancelled tomorrow due to pt needing to have her virtual colonscopy completed before coming in. Virtual colonscopy rescheduled to 01/03/20. New appt made for 4/29 at 9am with Dr Celine Ahr.  Tried contacting pt with no answer. Left vm to call back to the office.

## 2019-12-20 ENCOUNTER — Ambulatory Visit: Payer: Self-pay | Admitting: General Surgery

## 2019-12-30 ENCOUNTER — Emergency Department
Admission: EM | Admit: 2019-12-30 | Discharge: 2019-12-31 | Disposition: A | Discharge status: Home / Self Care | Payer: Self-pay | Attending: Student | Admitting: Student

## 2019-12-30 ENCOUNTER — Other Ambulatory Visit: Payer: Self-pay

## 2019-12-30 ENCOUNTER — Emergency Department: Payer: Self-pay

## 2019-12-30 ENCOUNTER — Encounter: Payer: Self-pay | Admitting: Emergency Medicine

## 2019-12-30 DIAGNOSIS — Z79899 Other long term (current) drug therapy: Secondary | ICD-10-CM | POA: Insufficient documentation

## 2019-12-30 DIAGNOSIS — R1030 Lower abdominal pain, unspecified: Secondary | ICD-10-CM | POA: Insufficient documentation

## 2019-12-30 DIAGNOSIS — E039 Hypothyroidism, unspecified: Secondary | ICD-10-CM | POA: Insufficient documentation

## 2019-12-30 DIAGNOSIS — Z8616 Personal history of COVID-19: Secondary | ICD-10-CM | POA: Insufficient documentation

## 2019-12-30 LAB — CBC
HCT: 36.1 % (ref 36.0–46.0)
Hemoglobin: 11.5 g/dL — ABNORMAL LOW (ref 12.0–15.0)
MCH: 25.4 pg — ABNORMAL LOW (ref 26.0–34.0)
MCHC: 31.9 g/dL (ref 30.0–36.0)
MCV: 79.7 fL — ABNORMAL LOW (ref 80.0–100.0)
Platelets: 220 10*3/uL (ref 150–400)
RBC: 4.53 MIL/uL (ref 3.87–5.11)
WBC: 4.7 10*3/uL (ref 4.0–10.5)
nRBC: 0 % (ref 0.0–0.2)

## 2019-12-30 LAB — COMPREHENSIVE METABOLIC PANEL
ALT: 22 U/L (ref 0–44)
AST: 32 U/L (ref 15–41)
Albumin: 3.9 g/dL (ref 3.5–5.0)
Alkaline Phosphatase: 72 U/L (ref 38–126)
Anion gap: 9 (ref 5–15)
BUN: 9 mg/dL (ref 6–20)
CO2: 25 mmol/L (ref 22–32)
Calcium: 8.7 mg/dL — ABNORMAL LOW (ref 8.9–10.3)
Chloride: 106 mmol/L (ref 98–111)
Creatinine, Ser: 0.49 mg/dL (ref 0.44–1.00)
GFR calc Af Amer: >60 mL/min (ref >60)
GFR calc non Af Amer: >60 mL/min (ref >60)
Glucose, Bld: 130 mg/dL — ABNORMAL HIGH (ref 70–99)
Potassium: 4 mmol/L (ref 3.5–5.1)
Sodium: 140 mmol/L (ref 135–145)
Total Bilirubin: 0.6 mg/dL (ref 0.3–1.2)
Total Protein: 7.1 g/dL (ref 6.5–8.1)

## 2019-12-30 LAB — LIPASE, BLOOD: Lipase: 26 U/L (ref 11–51)

## 2019-12-30 LAB — TYPE AND SCREEN
ABO/RH(D): O POS
Antibody Screen: NEGATIVE

## 2019-12-30 MED ORDER — AMOXICILLIN-POT CLAVULANATE 875-125 MG PO TABS
1.0000 | ORAL_TABLET | Freq: Two times a day (BID) | ORAL | 0 refills | Status: DC
Start: 2019-12-30 — End: 2019-12-31

## 2019-12-30 MED ORDER — IOHEXOL 300 MG/ML  SOLN
100.0000 mL | Freq: Once | INTRAMUSCULAR | Status: AC | PRN
  Administered 2019-12-30: 100 mL via INTRAVENOUS

## 2019-12-30 MED ORDER — SODIUM CHLORIDE 0.9% FLUSH: 3.0000 mL | Freq: Once | INTRAVENOUS | Status: DC

## 2019-12-30 NOTE — ED Triage Notes (Signed)
FIRST NURSE NOTE- here for blood in stool.  Ambulatory, NAD. Spanish speaking.

## 2019-12-30 NOTE — ED Provider Notes (Signed)
[facility] Emergency Department Provider Note  ____________________________________________   First MD Initiated Contact with Patient 12/30/19 2043     (approximate)  I have reviewed the triage vital signs and the nursing notes.  History  Chief Complaint Lower abdominal pain Increased BM   HPI Debra [name] is a 54 y.o. female with hx of iron deficiency anemia, recently admitted in March for symptomatic anemia 2/2 slow GI bleed in setting of sigmoid colon mass seen on colonoscopy. Biopsy done was inconclusive, but high suspicion for colon cancer based on clinical evaluation, scheduled for repeat evaluation later this week.  Patient states ever since her colonoscopy cleanout and hospitalization she has had dark, even black stools. On chart review, does appear that she was started on iron supplementation. She reports compliance with this. This is chronic since starting her iron and unchanged per patient. However, over the last 3 days she has had some lower abdominal discomfort as well as increased number of bowel movements. This is new for her. Pain is located to the lower abdomen, is constant and moderate in severity. Cramping type sensation. No radiation. No alleviating or aggravating components. She does note that she has had increased number of bowel movements daily over this time period as well. She notes that she seems to need to use the restroom soon after eating over the last several days. She denies any nausea or vomiting. No fevers.  She is primarily concerned because she wants to make sure that these new symptoms would not prohibit her from undergoing her scheduled further outpatient work-up for her colon mass.   Past Medical Hx Past Medical History:  Diagnosis Date  . Hypothyroidism   . Thyroid disease     Problem List Patient Active Problem List   Diagnosis Date Noted  . Iron deficiency anemia due to chronic blood loss 11/22/2019  .  Colonic mass   . COVID-19 virus detected 11/20/2019  . Symptomatic anemia 11/20/2019  . Hypothyroidism 11/20/2019  . Iron deficiency anemia 11/20/2019  . GIB (gastrointestinal bleeding) 11/20/2019    Past Surgical Hx Past Surgical History:  Procedure Laterality Date  . COLONOSCOPY WITH PROPOFOL N/A 11/21/2019   Procedure: COLONOSCOPY WITH PROPOFOL;  Surgeon: Lin Landsman, MD;  [facility];  Service: Gastroenterology;  Laterality: N/A;  . ESOPHAGOGASTRODUODENOSCOPY (EGD) WITH PROPOFOL N/A 11/21/2019   Procedure: ESOPHAGOGASTRODUODENOSCOPY (EGD) WITH PROPOFOL;  Surgeon: Lin Landsman, MD;  Location: [facility] ENDOSCOPY;  Service: Gastroenterology;  Laterality: N/A;    Medications Prior to Admission medications   Medication Sig Start Date End Date Taking? Authorizing Provider  Bismuth Subsalicylate (PEPTO-BISMOL PO) Take by mouth as needed.    [provider]  ferrous sulfate 325 (65 FE) MG tablet Take 1 tablet (325 mg total) by mouth 2 (two) times daily with a meal. 11/22/19   Fritzi Mandes, MD  levothyroxine (SYNTHROID) 150 MCG tablet Take 150 mcg by mouth daily before breakfast.    [provider]  senna-docusate (SENOKOT-S) 8.6-50 MG tablet Take 1 tablet by mouth at bedtime. 11/22/19   Fritzi Mandes, MD    Allergies Patient has no known allergies.  Family Hx Family History  Problem Relation Age of Onset  . Diabetes Sister     Social Hx Social History   Tobacco Use  . Smoking status: Never Smoker  . Smokeless tobacco: Never Used  Substance Use Topics  . Alcohol use: Never  . Drug use: Never     Review of Systems  Constitutional: Negative for fever. Negative for chills. Eyes: Negative for visual changes. ENT: Negative for sore throat. Cardiovascular: Negative for chest pain. Respiratory: Negative for shortness of breath. Gastrointestinal: Positive for lower abdominal pain, increased bowel movements, dark stools Genitourinary:  Negative for dysuria. Musculoskeletal: Negative for leg swelling. Skin: Negative for rash. Neurological: Negative for headaches.   Physical Exam  Vital Signs: ED Triage Vitals  Enc Vitals Group     BP 12/30/19 1748 (!) 141/77     Pulse Rate 12/30/19 1748 90     Resp 12/30/19 1748 20     Temp 12/30/19 1748 98.8 F (37.1 C)     Temp Source 12/30/19 1748 Oral     SpO2 12/30/19 1748 98 %     Weight 12/30/19 1750 189 lb (85.7 kg)     Height 12/30/19 1750 5\' 2"  (1.575 m)     Head Circumference --      Peak Flow --      Pain Score 12/30/19 1748 0     Pain Loc --      Pain Edu? --      Excl. in [place]? --     Constitutional: Alert and oriented. Well appearing. NAD.  Head: Normocephalic. Atraumatic. Eyes: Conjunctivae clear. Sclera anicteric. Pupils equal and symmetric. Nose: No masses or lesions. No congestion or rhinorrhea. Mouth/Throat: Wearing mask.  Neck: No stridor. Trachea midline.  Cardiovascular: Normal rate, regular rhythm. Extremities well perfused. Respiratory: Normal respiratory effort.  Lungs CTAB. Gastrointestinal: Soft. Non-distended. Non-tender.  Genitourinary: Deferred. Musculoskeletal: No lower extremity edema. No deformities. Neurologic:  Normal speech and language. No gross focal or lateralizing neurologic deficits are appreciated.  Skin: Skin is warm, dry and intact. No rash noted. Psychiatric: Mood and affect are appropriate for situation.    Radiology  Personally reviewed available imaging myself.   CT A/P - IMPRESSION:  1. Redemonstration of a narrowed and circumferentially thickened  segment of the mid sigmoid colon with some increasing hazy adjacent  stranding of the mesentery. No mural discontinuity, perforation,  extraluminal fluid or gas is seen. Per chart and discussion with the  ordering provider this is currently under suspicion for colonic  malignancy at this time with inconclusive results of the recent  biopsy. This appearance certainly  could reflect a colonic malignancy  but with adjacent inflammatory features which may suggest  superimposed features of colitis at this time.  2. A few tiny sub 5 mm lymph nodes in the mesenteric leaflets  adjacent the focally thickened segment of colon, possibly reactive  and similar to comparison exam but remain nonspecific given the  indeterminate nature of the primary lesion at this time.  3. Focal fat stranding extending to the anterolateral margin of the  urinary bladder with some mild bladder wall thickening, favored to  be reactive.    Procedures  Procedure(s) performed (including critical care):  Procedures   Initial Impression / Assessment and Plan / MDM / ED Course  54 y.o. female who presents to the ED for lower abdominal pain, diarrhea. This is in the setting of currently being worked up for likely colonic malignancy after being admitted for symptomatic anemia about 1 month ago.  Ddx: colitis, diverticulitis, recurrent GI bleed, worsening malignancy status. She is able to tolerate PO and has had no nausea or vomiting therefore doubt obstructive pathology.  Will plan for labs, imaging  Labs reveal hemoglobin improved from prior, 11.5 from 10.6 two weeks ago. CT scan as above, findings concerning for a colonic  malignancy, but additionally noted adjacent inflammatory features which could suggest superimposed colitis. This would align with her symptoms of new lower abdominal cramping as well as increased bowel movements. We will elect to treat in case of superimposed colitis with a course of Augmentin, and advised continued work-up as scheduled. Given return precautions.   _______________________________   As part of my medical decision making I have reviewed available labs, radiology tests, reviewed old records/performed chart review.   Final Clinical Impression(s) / ED Diagnosis  Final diagnoses:  Lower abdominal pain       Note:  This document was prepared  using Dragon voice recognition software and may include unintentional dictation errors.   Lilia Pro., MD 12/31/19 [phone]

## 2019-12-30 NOTE — Discharge Instructions (Signed)
Thank you for letting us take care of you in the emergency department today.  Your CT scan does show the same narrowing and thickening of your colon that they saw on your colonoscopy.  You should keep your appointment for the repeat evaluation that you have scheduled in a few days.  The CT scan also shows you may have some increased inflammation in your colon, which we will treat with a course of antibiotics in case of colitis.  Please take these as directed.  You should still be able to complete your work-up as planned.  Please return to the emergency department for any new or worsening symptoms.  Gracias por dejarnos atenderlo en el departamento de emergencias hoy.  Su tomografa computarizada muestra el mismo estrechamiento y engrosamiento de su colon que vieron en su colonoscopia. Debe acudir a su cita para la repeticin de la evaluacin que ha [employer].  La tomografa computarizada tambin muestra que puede tener un aumento de la inflamacin en el colon, que trataremos con un ciclo de antibiticos en caso de colitis. Tmelos segn las indicaciones. An debera poder completar su trabajo segn lo planeado.  Regrese al departamento de emergencias por cualquier sntoma nuevo o que empeore.

## 2019-12-30 NOTE — ED Triage Notes (Signed)
Pt presents to ED via POV. Pt states was admitted in March for anemia, pt states blood in stools when she goes to the bathroom x 4 days. Pt states has appt on 4/28 but was referred here for blood in stools.  Pt visualized in NAD, VSS in triage. Pt states when she was admitted in march they "cleaned out her stomach and it was black". Pt also c/o back pain after using the bathroom, and c/o having to have a BM immediately after eating.   Estill Bamberg, Interpreter in triage to perform triage.

## 2019-12-31 MED ORDER — AMOXICILLIN-POT CLAVULANATE 875-125 MG PO TABS
1.0000 | ORAL_TABLET | Freq: Two times a day (BID) | ORAL | 0 refills | Status: AC
Start: 2019-12-31 — End: 2020-01-10

## 2020-01-03 ENCOUNTER — Ambulatory Visit
Admission: RE | Admit: 2020-01-03 | Discharge: 2020-01-03 | Disposition: A | Discharge status: Home / Self Care | Payer: Self-pay | Source: Ambulatory Visit | Attending: Oncology | Admitting: Oncology

## 2020-01-03 ENCOUNTER — Telehealth: Payer: Self-pay | Admitting: *Deleted

## 2020-01-03 DIAGNOSIS — K6389 Other specified diseases of intestine: Secondary | ICD-10-CM

## 2020-01-03 NOTE — Telephone Encounter (Signed)
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  Remainder of the colon evaluation is somewhat limited due to large amount of retained stool and incomplete distension proximal to the annular constricting lesion.  These results will be called to the ordering clinician or representative by the Radiologist Assistant, and communication documented in the PACS or [employer].   Electronically Signed   By: Rolm Baptise M.D.   On: 01/03/2020 10:42

## 2020-01-05 ENCOUNTER — Other Ambulatory Visit: Payer: Self-pay

## 2020-01-05 ENCOUNTER — Encounter: Payer: Self-pay | Admitting: General Surgery

## 2020-01-05 ENCOUNTER — Ambulatory Visit (INDEPENDENT_AMBULATORY_CARE_PROVIDER_SITE_OTHER): Payer: Self-pay | Admitting: General Surgery

## 2020-01-05 VITALS — BP 132/83 | HR 65 | Temp 97.9°F | Ht 60.5 in | Wt 190.6 lb

## 2020-01-05 DIAGNOSIS — K6389 Other specified diseases of intestine: Secondary | ICD-10-CM

## 2020-01-05 MED ORDER — ERYTHROMYCIN BASE 500 MG PO TABS: ORAL_TABLET | ORAL | 0 refills | Status: DC

## 2020-01-05 MED ORDER — BISACODYL 5 MG PO TBEC
DELAYED_RELEASE_TABLET | ORAL | 0 refills | Status: DC
Start: 2020-01-05 — End: 2020-02-14

## 2020-01-05 MED ORDER — POLYETHYLENE GLYCOL 3350 17 GM/SCOOP PO POWD
ORAL | 0 refills | Status: DC
Start: 2020-01-05 — End: 2020-02-14

## 2020-01-05 MED ORDER — NEOMYCIN SULFATE 500 MG PO TABS
ORAL_TABLET | ORAL | 0 refills | Status: DC
Start: 2020-01-05 — End: 2020-02-14

## 2020-01-05 NOTE — Patient Instructions (Addendum)
Continue to take all of your medication as prescribed. tome todos sus medicamentos segn las indicaciones  Our surgery scheduler will be in contact with you to look at surgery dates and to go over surgery instructions. nuestro programador de cirugas lo llamar  You will need to do a bowel prep before surgery and go on a clear liquid diet prior to surgery. Deber seguir una dieta de lquidos claros antes de la ciruga. Tendr Ardelia Mems preparacin intestinal para Chief Strategy Officer antes de la [place].  Colectoma laparoscpica Laparoscopic Colectomy La colectoma laparoscpica es un procedimiento quirrgico mediante el cual se extirpa todo o parte del intestino grueso (colon). Este procedimiento se [place] para tratar varias afecciones, incluidas las siguientes:  Inflamacin e infeccin del colon (diverticulitis).  Tumores o masas tumorales en el colon.  Enfermedad inflamatoria del intestino, como la enfermedad de Crohn o la colitis ulcerosa. La colectoma es una opcin cuando los sntomas no se pueden controlar con medicamentos.  Hemorragia del colon que no se puede controlar con otro mtodo.  Bloqueo u obstruccin del colon. Informe al mdico acerca de lo siguiente:  Cualquier alergia que tenga.  Todos los [employer], incluidos vitaminas, hierbas, gotas oftlmicas, cremas y medicamentos de venta libre.  Cualquier problema previo que usted o los miembros de su familia hayan tenido con anestsicos.  Cualquier enfermedad de la sangre que tenga.  Cirugas a las que se someti.  Cualquier afeccin mdica que tenga. Cules son los riesgos? En general, se trata de un procedimiento seguro. Sin embargo, pueden ocurrir complicaciones, por ejemplo:  Infeccin.  Hemorragia.  Reacciones alrgicas a los medicamentos o a los tintes.  Daos a Catering manager u otros rganos.  Filtracin en el lugar de la sutura del colon.  Bloqueo futuro del intestino delgado debido al tejido  cicatricial. Probablemente sea necesaria otra ciruga para reparar esto.  Necesidad de Naval architect. Las complicaciones, como el dao a otros rganos o las hemorragias excesivas, pueden requerir que el cirujano convierta un procedimiento laparoscpico en un procedimiento abierto. Esto implica hacer una incisin ms grande en el abdomen. Qu ocurre antes del procedimiento? Mantenerse hidratado Siga las indicaciones del mdico acerca de la hidratacin, las cuales pueden incluir lo siguiente:  Hasta 2horas antes del procedimiento, puede beber lquidos transparentes, como agua, jugos frutales transparentes, caf negro y t solo.  Medicamentos  Consulte al mdico si debe hacer o no lo siguiente: ? Cambiar o suspender los medicamentos que toma habitualmente. Esto es muy importante si toma medicamentos para la diabetes o anticoagulantes. ? Tomar medicamentos como aspirina e ibuprofeno. Estos medicamentos pueden tener un efecto anticoagulante en la [place]. No tome estos medicamentos antes del procedimiento si su mdico le indica que no lo haga.  Tambin se le pueden recetar antibiticos para eliminar las bacterias del colon. Siga cuidadosamente las indicaciones y tome los medicamentos en los horarios correctos. Instrucciones generales  Posiblemente le receten un producto oral para limpiar el colon y prepararse para la ciruga: ? Siga las indicaciones del mdico acerca de cmo hacerlo. ? No coma ni beba nada ms una vez que haya comenzado con la preparacin del colon, a menos que el mdico le indique que es seguro [place].  No consuma ningn producto que contenga nicotina o tabaco, como cigarrillos y Psychologist, sport and exercise. Si necesita ayuda para dejar de fumar, consulte al mdico. Sander Nephew ocurre durante el procedimiento?  Para disminuir el riesgo de contraer una infeccin: ? El equipo mdico se lavar o se desinfectar las  manos. ? Le lavarn la piel con jabn.  Le  introducirn un tubo (catter) intravenoso en una vena para administrarle lquidos y medicamentos.  Podrn administrarle uno de los siguientes medicamentos: ? Un medicamento para ayudarlo a relajarse (sedante). ? Un medicamento para hacerlo dormir (anestesia general).  Le colocarn pequeos monitores en el cuerpo. Ellos controlarn el corazn, la presin arterial y el nivel de oxgeno.  Durante el procedimiento, posiblemente le coloquen un respirador.  Le insertarn un tubo delgado y flexible (catter) en la vejiga para drenar la orina.  Posiblemente le coloquen un tubo (sonda nasogstrica o NG) por la [employer], para drenar los lquidos estomacales.  Le llenarn de aire el abdomen para expandirlo. Esto permite que el cirujano tenga ms espacio para operar y Copy visualizacin de los rganos.  Le harn varios cortes pequeos (incisiones) en el abdomen.  Le insertarn un tubo delgado e iluminado, que tiene una pequea cmara en el extremo (laparoscopio) a travs de una de las pequeas incisiones. La cmara del laparoscopio enva una imagen a una pantalla de computadora que se encuentra en el quirfano. De este modo, el cirujano tendr Ardelia Mems buena visin del interior del abdomen.  A travs de las dems pequeas incisiones del abdomen, se insertarn tubos huecos. A travs de estos tubos, se colocar el instrumental necesario para el procedimiento.  Se colocarn pinzas o grapas en ambos extremos de la parte enferma del colon.  Luego se extirpar la parte del intestino entre las pinzas o grapas.  Si es posible, los extremos del colon sano se unirn mediante puntos (suturas) o grapas para que el cuerpo pueda excretar los desechos (heces).  A veces, el colon restante no se puede volver a coser. Si este es el [place], ser necesaria una colostoma. Si se necesita una colostoma: ? Se realizar una abertura (estoma) hacia el exterior del cuerpo a travs del abdomen. ? Por la  abertura se saca el extremo del colon y se lo cose a la piel. ? Luego, se adjunta una bolsa desmontable a la abertura, a la cual se drenarn las heces. ? Este procedimiento puede ser transitorio o [place].  La incisin de la colostoma se cerrar con suturas o grapas. Este procedimiento puede variar segn el mdico y el hospital. Sander Nephew sucede despus del procedimiento?  Le controlarn la presin arterial, la frecuencia cardaca, la frecuencia respiratoria y Retail buyer de oxgeno en la sangre hasta que desaparezca el efecto de los medicamentos administrados.  Le administrarn lquidos a travs de un tubo (catter) intravenoso, [employer] intestinos comiencen a funcionar correctamente.  Una vez que los intestinos vuelvan a funcionar, comenzar una dieta de lquidos claros y luego consumir alimentos slidos, segn los [place].  Le darn medicamentos para Financial controller y las nuseas, si es necesario.  No conduzca durante 24horas si le administraron un sedante. Esta informacin no tiene Marine scientist el consejo del mdico. Asegrese de hacerle al mdico cualquier pregunta que tenga. Document Revised: 04/03/2017 Document Reviewed: 01/29/2016 Elsevier Patient Education  2020 [employer].

## 2020-01-05 NOTE — H&P (View-Only) (Signed)
Patient ID: Debra [name], female   DOB: 1965/11/25, 54 y.o.   MRN: [id number]  Chief Complaint  Patient presents with  . Follow-up    Colon mass    HPI Debra [name] is a 54 y.o. female.   She is here for follow-up from an inpatient consultation.  My initial consult note is partially copied here:  "Reason for Consult: Malignant-appearing colon stricture, likely adenocarcinoma Referring Physician: Fritzi Mandes, MD, hospital medicine  Debra [name] is an 54 y.o. female.  HPI: She presented to the emergency department on November 20, 2019 with complaints of generalized weakness.  She was found to be profoundly anemic with hemoglobin of 6.6.  Gastroenterology was consulted.  Upper and lower endoscopies were performed today.  There was no sign of a source for her anemia on the upper endoscopy, however on colonoscopy, she was found to have a malignant appearing stricture in the sigmoid colon, approximately 25 cm from the anus.  This has been biopsied.  There were no stigmata of recent bleeding.  The performing gastroenterologist did not traverse the stricture with the colonoscope.  General surgery has been consulted in this setting by Dr. 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  The interview was held with the assistance of a Spanish language interpreter  (Debra [name]) via [employer].  Debra [name] states that she is not experiencing any abdominal pain.  She is still having bowel movements, but does endorse significant constipation.  She has noticed some dark discoloration of her stools.  She denies nausea or vomiting.  She was able to tolerate the oral prep without difficulty.  She denies any known family history of colorectal malignancy.  She has never had surgery, abdominal or otherwise.  She is COVID-19 positive by rapid antigen test, but is not symptomatic at this time."  While she was still in the hospital, a CT scan of  the chest abdomen and pelvis was performed that did not show any metastatic findings.  CEA was also performed and was elevated at 7.9.  Due to inability to fully evaluate the colon with the scope, she underwent virtual colonoscopy 2 days ago.  No additional lesions were appreciated.  She is here today to further discuss surgical intervention for presumed colon cancer.  Today's interview was held with the assistance of a Spanish language interpreter, Debra [name].  Her daughter is also with her today.  She endorses a rumbling sensation in her abdomen.  She does notice blood in her stool and experiences pain in her lower back when she has bowel movements.  She endorses constipation but does have 1-2 bowel movements a day.  She denies any fevers or chills.  No nausea or vomiting.  She does endorse some weight loss.    Past Medical History:  Diagnosis Date  . Hypothyroidism   . Thyroid disease     Past Surgical History:  Procedure Laterality Date  . COLONOSCOPY WITH PROPOFOL N/A 11/21/2019   Procedure: COLONOSCOPY WITH PROPOFOL;  Surgeon: Lin Landsman, MD;  Location: [facility] ENDOSCOPY;  Service: Gastroenterology;  Laterality: N/A;  . ESOPHAGOGASTRODUODENOSCOPY (EGD) WITH PROPOFOL N/A 11/21/2019   Procedure: ESOPHAGOGASTRODUODENOSCOPY (EGD) WITH PROPOFOL;  Surgeon: Lin Landsman, MD;  Location: [facility] ENDOSCOPY;  Service: Gastroenterology;  Laterality: N/A;    Family History  Problem Relation Age of Onset  . Diabetes Sister     Social History Social History   Tobacco Use  . Smoking status: Never  Smoker  . Smokeless tobacco: Never Used  Substance Use Topics  . Alcohol use: Never  . Drug use: Never    No Known Allergies  Current Outpatient Medications  Medication Sig Dispense Refill  . amoxicillin-clavulanate (AUGMENTIN) 875-125 MG tablet Take 1 tablet by mouth 2 (two) times daily for 10 days. 20 tablet 0  . Bismuth Subsalicylate (PEPTO-BISMOL PO) Take by mouth as needed.    .  ferrous sulfate 325 (65 FE) MG tablet Take 1 tablet (325 mg total) by mouth 2 (two) times daily with a meal. 60 tablet 3  . levothyroxine (SYNTHROID) 150 MCG tablet Take 150 mcg by mouth daily before breakfast.    . senna-docusate (SENOKOT-S) 8.6-50 MG tablet Take 1 tablet by mouth at bedtime. 30 tablet 1  . bisacodyl (DULCOLAX) 5 MG EC tablet Take all 4 tablets at 8 am the morning prior to your surgery. 4 tablet 0  . erythromycin base (E-MYCIN) 500 MG tablet Take 2 tablets at 8am, 2 tablets at 2pm, and 2 tablets at 8pm the day prior to surgery. 6 tablet 0  . neomycin (MYCIFRADIN) 500 MG tablet Take 2 tablet at 8am, take 2 tablets at 2pm, and take 2 tablets at 8pm the day prior to your surgery 6 tablet 0  . polyethylene glycol powder (MIRALAX) 17 GM/SCOOP powder Mix full container in 64 ounces of Gatorade or other clear liquid. NO Red 238 g 0   No current facility-administered medications for this visit.    Review of Systems Review of Systems  All other systems reviewed and are negative. Or as discussed in the history of present illness.  Blood pressure 132/83, pulse 65, temperature 97.9 F (36.6 C), height 5' 0.5" (1.537 m), weight 190 lb 9.6 oz (86.5 kg), SpO2 99 %. Body mass index is 36.61 kg/m.  Physical Exam Physical Exam Constitutional:      General: She is not in acute distress.    Appearance: Normal appearance. She is obese.  HENT:     Head: Normocephalic and atraumatic.     Nose:     Comments: Covered with a mask secondary to COVID-19 precautions    Mouth/Throat:     Comments: Covered with a mask secondary to COVID-19 precautions Eyes:     General: No scleral icterus.       Right eye: No discharge.        Left eye: No discharge.     Conjunctiva/sclera: Conjunctivae normal.  Neck:     Comments: No palpable thyromegaly or dominant thyroid masses appreciated. Cardiovascular:     Rate and Rhythm: Normal rate and regular rhythm.     Pulses: Normal pulses.  Pulmonary:      Effort: Pulmonary effort is normal.     Breath sounds: Normal breath sounds.  Abdominal:     General: Bowel sounds are normal.     Palpations: Abdomen is soft. There is no mass.     Tenderness: There is no abdominal tenderness.     Comments: Protuberant, consistent with her level of obesity.  Genitourinary:    Comments: Deferred Musculoskeletal:        General: No swelling or tenderness.     Cervical back: Normal range of motion.  Lymphadenopathy:     Cervical: No cervical adenopathy.  Skin:    General: Skin is warm and dry.     Comments: Vitiligo  Neurological:     General: No focal deficit present.     Mental Status: She is alert and  oriented to person, place, and time.  Psychiatric:        Mood and Affect: Mood normal.        Behavior: Behavior normal.     Data Reviewed I have reviewed all of the relevant imaging, including the CT scan of the chest abdomen and pelvis performed while she was in the hospital, as well as a virtual colonoscopy.  I am in agreement with the radiologic findings copied here:  FINDINGS: CT CHEST FINDINGS  Cardiovascular: There is no cardiomegaly or pericardial effusion. The thoracic aorta is unremarkable. The origins of the great vessels of the aortic arch appear patent. The central pulmonary arteries are unremarkable.  Mediastinum/Nodes: There is no hilar or mediastinal adenopathy. The esophagus is grossly unremarkable. No mediastinal fluid collection.  Lungs/Pleura: Bilateral clusters of ground-glass opacities with the largest measuring approximately 3.3 x 1.9 cm in the right upper lobe. Findings are indeterminate but likely represent atypical pneumonia. Clinical correlation and close follow-up recommended. There is no pleural effusion or pneumothorax. Left lung base linear atelectasis/scarring. The central airways are patent.  Musculoskeletal: No acute osseous pathology.  CT ABDOMEN PELVIS FINDINGS  No intra-abdominal free air  or free fluid.  Hepatobiliary: Probable mild fatty infiltration of the liver. No intrahepatic biliary ductal dilatation. The gallbladder is unremarkable.  Pancreas: Unremarkable. No pancreatic ductal dilatation or surrounding inflammatory changes.  Spleen: Normal in size without focal abnormality.  Adrenals/Urinary Tract: The adrenal glands are unremarkable. There is no hydronephrosis on either side. There is symmetric enhancement and excretion of contrast by both kidneys. The visualized ureters and urinary bladder appear unremarkable.  Stomach/Bowel: There is approximately 5.5 cm segmental area of thickening of the sigmoid colon with soft tissue mass consistent with malignancy and in keeping with colonoscopy findings. There is no bowel obstruction. Small scattered distal colonic diverticula without active inflammatory changes. The appendix is normal.  Vascular/Lymphatic: The abdominal aorta and IVC are unremarkable. No portal venous gas. Several small rounded nodular density adjacent to the sigmoid colon mass may represent early extra colonic extension. These lymph nodes measure up to 4 mm. No distant adenopathy.  Reproductive: The uterus and ovaries are grossly unremarkable.  Other: None  Musculoskeletal: Osteopenia with mild degenerative changes of the spine. No acute osseous pathology.  IMPRESSION: 1. Segmental area of masslike thickening of the sigmoid colon consistent with malignancy and in keeping with colonoscopy findings. No bowel obstruction. Normal appendix. 2. Several small mildly rounded lymph node adjacent to the sigmoid colon concerning for early extramural involvement. No definite evidence of distant metastatic disease. 3. Bilateral clusters of ground-glass opacities, likely atypical pneumonia. Clinical correlation and close follow-up recommended.  CLINICAL DATA:  Abdominal pain, blood in stool.  Abnormal CT.  EXAM: CT VIRTUAL COLONOSCOPY  DIAGNOSTIC  TECHNIQUE: The patient was given a standard bowel preparation with Gastrografin and barium for fluid and stool tagging respectively. The quality of the bowel preparation is poor with large amount of retained barium. Automated CO2 insufflation of the colon was performed prior to image acquisition and colonic distention is poor. Image post processing was used to generate a 3D endoluminal fly-through projection of the colon and to electronically subtract stool/fluid as appropriate.  COMPARISON:  12/30/2019  FINDINGS: VIRTUAL COLONOSCOPY  Large amount of retained barium throughout the colon. There is poor distention of the colon, particularly the descending colon. There is a persistent area of annular constriction and wall thickening noted within the proximal to mid sigmoid colon in the area of prior abnormality. This  is concerning for annular neoplasm. This is several cm in length, at least 6 cm in length. No visible additional annular constricting lesions or persistent non mobile polypoid filling defect.  Virtual colonoscopy is not designed to detect diminutive polyps (i.e., less than or equal to 5 mm), the presence or absence of which may not affect clinical management.  CT ABDOMEN AND PELVIS WITHOUT CONTRAST  Lower chest: Lung bases are clear. No effusions. Heart is normal size. Small hiatal hernia.  Hepatobiliary: No focal hepatic abnormality. Gallbladder unremarkable.  Pancreas: No focal abnormality or ductal dilatation.  Spleen: No focal abnormality.  Normal size.  Adrenals/Urinary Tract: No adrenal abnormality. No focal renal abnormality. No stones or hydronephrosis. Urinary bladder is unremarkable.  Stomach/Bowel: Stomach and small bowel decompressed, unremarkable. Appendix is normal.  Vascular/Lymphatic: No evidence of aneurysm or adenopathy.  Reproductive: Uterus and adnexa unremarkable.  No mass.  Other: No free fluid or free  air.  Musculoskeletal: No acute bony abnormality.  IMPRESSION: Long segment area of wall thickening and annular constriction within the sigmoid colon in the area of previous concern most compatible with annular malignancy.  Remainder of the colon evaluation is somewhat limited due to large amount of retained stool and incomplete distension proximal to the annular constricting lesion.  I reviewed the pathology from her biopsy. COLON MASS, SIGMOID AT 25 CM; COLD BIOPSY:  - BENIGN COLONIC MUCOSA WITH SUPERFICIAL HYPERPLASTIC CHANGES.  - NO EVIDENCE OF DYSPLASIA OR MALIGNANCY.  Despite this finding, the clinical appearance of the lesion as well as the radiographic imaging is still quite suggestive of a colonic malignancy.  Results for SYNIYA, CUI (MRN [id number]) as of 01/05/2020 15:47  Ref. Range 11/22/2019 07:04  CEA Latest Ref Range: 0.0 - 4.7 ng/mL 7.9 (H)  This CEA is elevated, in keeping with the presumed diagnosis of colon cancer.  Assessment This is a 54 year old woman who underwent endoscopy to evaluate for anemia.  She was found to have a colon mass.  Despite the fact that biopsy was negative for malignancy, it is believed by all of the practitioners involved, including gastroenterology, oncology, and myself as the surgeon, that this most likely represents a primary colon cancer.  I have recommended that she undergo resection.  Plan With the assistance of the interpreter, I discussed the risks of the operation with the patient and her daughter.  These risks include, but are not limited to, bleeding, infection, damage to surrounding structures including the ureter, anastomotic leak, need for potential diverting ileostomy, need for additional treatment and/or surgical procedures, as well as the risks of general anesthesia.  She and her daughter had the opportunity to ask any questions and these were answered to their satisfaction.  We will coordinate with urology to have a  stent placed versus ICG for assistance with ureteral identification during the operation.  She was provided with bowel prep prescription and instructions.  We will schedule her for an open sigmoid colectomy at a mutually convenient date.    [name] 01/05/2020, 1:21 PM

## 2020-01-05 NOTE — Progress Notes (Signed)
Patient ID: Debra [name], female   DOB: 1966/07/06, 54 y.o.   MRN: [id number]  Chief Complaint  Patient presents with  . Follow-up    Colon mass    HPI Debra [name] is a 54 y.o. female.   She is here for follow-up from an inpatient consultation.  My initial consult note is partially copied here:  "Reason for Consult: Malignant-appearing colon stricture, likely adenocarcinoma Referring Physician: Fritzi Mandes, MD, hospital medicine  Debra [name] is an 54 y.o. female.  HPI: She presented to the emergency department on November 20, 2019 with complaints of generalized weakness.  She was found to be profoundly anemic with hemoglobin of 6.6.  Gastroenterology was consulted.  Upper and lower endoscopies were performed today.  There was no sign of a source for her anemia on the upper endoscopy, however on colonoscopy, she was found to have a malignant appearing stricture in the sigmoid colon, approximately 25 cm from the anus.  This has been biopsied.  There were no stigmata of recent bleeding.  The performing gastroenterologist did not traverse the stricture with the colonoscope.  General surgery has been consulted in this setting by Dr. 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  The interview was held with the assistance of a Spanish language interpreter  (Ebelyn) via [employer].  Ms. Debra [name] states that she is not experiencing any abdominal pain.  She is still having bowel movements, but does endorse significant constipation.  She has noticed some dark discoloration of her stools.  She denies nausea or vomiting.  She was able to tolerate the oral prep without difficulty.  She denies any known family history of colorectal malignancy.  She has never had surgery, abdominal or otherwise.  She is COVID-19 positive by rapid antigen test, but is not symptomatic at this time."  While she was still in the hospital, a CT scan of  the chest abdomen and pelvis was performed that did not show any metastatic findings.  CEA was also performed and was elevated at 7.9.  Due to inability to fully evaluate the colon with the scope, she underwent virtual colonoscopy 2 days ago.  No additional lesions were appreciated.  She is here today to further discuss surgical intervention for presumed colon cancer.  Today's interview was held with the assistance of a Spanish language interpreter, [name].  Her daughter is also with her today.  She endorses a rumbling sensation in her abdomen.  She does notice blood in her stool and experiences pain in her lower back when she has bowel movements.  She endorses constipation but does have 1-2 bowel movements a day.  She denies any fevers or chills.  No nausea or vomiting.  She does endorse some weight loss.    Past Medical History:  Diagnosis Date  . Hypothyroidism   . Thyroid disease     Past Surgical History:  Procedure Laterality Date  . COLONOSCOPY WITH PROPOFOL N/A 11/21/2019   Procedure: COLONOSCOPY WITH PROPOFOL;  Surgeon: Lin Landsman, MD;  Location: [facility] ENDOSCOPY;  Service: Gastroenterology;  Laterality: N/A;  . ESOPHAGOGASTRODUODENOSCOPY (EGD) WITH PROPOFOL N/A 11/21/2019   Procedure: ESOPHAGOGASTRODUODENOSCOPY (EGD) WITH PROPOFOL;  Surgeon: Lin Landsman, MD;  Location: [facility] ENDOSCOPY;  Service: Gastroenterology;  Laterality: N/A;    Family History  Problem Relation Age of Onset  . Diabetes Sister     Social History Social History   Tobacco Use  . Smoking status: Never  Smoker  . Smokeless tobacco: Never Used  Substance Use Topics  . Alcohol use: Never  . Drug use: Never    No Known Allergies  Current Outpatient Medications  Medication Sig Dispense Refill  . amoxicillin-clavulanate (AUGMENTIN) 875-125 MG tablet Take 1 tablet by mouth 2 (two) times daily for 10 days. 20 tablet 0  . Bismuth Subsalicylate (PEPTO-BISMOL PO) Take by mouth as needed.    .  ferrous sulfate 325 (65 FE) MG tablet Take 1 tablet (325 mg total) by mouth 2 (two) times daily with a meal. 60 tablet 3  . levothyroxine (SYNTHROID) 150 MCG tablet Take 150 mcg by mouth daily before breakfast.    . senna-docusate (SENOKOT-S) 8.6-50 MG tablet Take 1 tablet by mouth at bedtime. 30 tablet 1  . bisacodyl (DULCOLAX) 5 MG EC tablet Take all 4 tablets at 8 am the morning prior to your surgery. 4 tablet 0  . erythromycin base (E-MYCIN) 500 MG tablet Take 2 tablets at 8am, 2 tablets at 2pm, and 2 tablets at 8pm the day prior to surgery. 6 tablet 0  . neomycin (MYCIFRADIN) 500 MG tablet Take 2 tablet at 8am, take 2 tablets at 2pm, and take 2 tablets at 8pm the day prior to your surgery 6 tablet 0  . polyethylene glycol powder (MIRALAX) 17 GM/SCOOP powder Mix full container in 64 ounces of Gatorade or other clear liquid. NO Red 238 g 0   No current facility-administered medications for this visit.    Review of Systems Review of Systems  All other systems reviewed and are negative. Or as discussed in the history of present illness.  Blood pressure 132/83, pulse 65, temperature 97.9 F (36.6 C), height 5' 0.5" (1.537 m), weight 190 lb 9.6 oz (86.5 kg), SpO2 99 %. Body mass index is 36.61 kg/m.  Physical Exam Physical Exam Constitutional:      General: She is not in acute distress.    Appearance: Normal appearance. She is obese.  HENT:     Head: Normocephalic and atraumatic.     Nose:     Comments: Covered with a mask secondary to COVID-19 precautions    Mouth/Throat:     Comments: Covered with a mask secondary to COVID-19 precautions Eyes:     General: No scleral icterus.       Right eye: No discharge.        Left eye: No discharge.     Conjunctiva/sclera: Conjunctivae normal.  Neck:     Comments: No palpable thyromegaly or dominant thyroid masses appreciated. Cardiovascular:     Rate and Rhythm: Normal rate and regular rhythm.     Pulses: Normal pulses.  Pulmonary:      Effort: Pulmonary effort is normal.     Breath sounds: Normal breath sounds.  Abdominal:     General: Bowel sounds are normal.     Palpations: Abdomen is soft. There is no mass.     Tenderness: There is no abdominal tenderness.     Comments: Protuberant, consistent with her level of obesity.  Genitourinary:    Comments: Deferred Musculoskeletal:        General: No swelling or tenderness.     Cervical back: Normal range of motion.  Lymphadenopathy:     Cervical: No cervical adenopathy.  Skin:    General: Skin is warm and dry.     Comments: Vitiligo  Neurological:     General: No focal deficit present.     Mental Status: She is alert and  oriented to person, place, and time.  Psychiatric:        Mood and Affect: Mood normal.        Behavior: Behavior normal.     Data Reviewed I have reviewed all of the relevant imaging, including the CT scan of the chest abdomen and pelvis performed while she was in the hospital, as well as a virtual colonoscopy.  I am in agreement with the radiologic findings copied here:  FINDINGS: CT CHEST FINDINGS  Cardiovascular: There is no cardiomegaly or pericardial effusion. The thoracic aorta is unremarkable. The origins of the great vessels of the aortic arch appear patent. The central pulmonary arteries are unremarkable.  Mediastinum/Nodes: There is no hilar or mediastinal adenopathy. The esophagus is grossly unremarkable. No mediastinal fluid collection.  Lungs/Pleura: Bilateral clusters of ground-glass opacities with the largest measuring approximately 3.3 x 1.9 cm in the right upper lobe. Findings are indeterminate but likely represent atypical pneumonia. Clinical correlation and close follow-up recommended. There is no pleural effusion or pneumothorax. Left lung base linear atelectasis/scarring. The central airways are patent.  Musculoskeletal: No acute osseous pathology.  CT ABDOMEN PELVIS FINDINGS  No intra-abdominal free air  or free fluid.  Hepatobiliary: Probable mild fatty infiltration of the liver. No intrahepatic biliary ductal dilatation. The gallbladder is unremarkable.  Pancreas: Unremarkable. No pancreatic ductal dilatation or surrounding inflammatory changes.  Spleen: Normal in size without focal abnormality.  Adrenals/Urinary Tract: The adrenal glands are unremarkable. There is no hydronephrosis on either side. There is symmetric enhancement and excretion of contrast by both kidneys. The visualized ureters and urinary bladder appear unremarkable.  Stomach/Bowel: There is approximately 5.5 cm segmental area of thickening of the sigmoid colon with soft tissue mass consistent with malignancy and in keeping with colonoscopy findings. There is no bowel obstruction. Small scattered distal colonic diverticula without active inflammatory changes. The appendix is normal.  Vascular/Lymphatic: The abdominal aorta and IVC are unremarkable. No portal venous gas. Several small rounded nodular density adjacent to the sigmoid colon mass may represent early extra colonic extension. These lymph nodes measure up to 4 mm. No distant adenopathy.  Reproductive: The uterus and ovaries are grossly unremarkable.  Other: None  Musculoskeletal: Osteopenia with mild degenerative changes of the spine. No acute osseous pathology.  IMPRESSION: 1. Segmental area of masslike thickening of the sigmoid colon consistent with malignancy and in keeping with colonoscopy findings. No bowel obstruction. Normal appendix. 2. Several small mildly rounded lymph node adjacent to the sigmoid colon concerning for early extramural involvement. No definite evidence of distant metastatic disease. 3. Bilateral clusters of ground-glass opacities, likely atypical pneumonia. Clinical correlation and close follow-up recommended.  CLINICAL DATA:  Abdominal pain, blood in stool.  Abnormal CT.  EXAM: CT VIRTUAL COLONOSCOPY  DIAGNOSTIC  TECHNIQUE: The patient was given a standard bowel preparation with Gastrografin and barium for fluid and stool tagging respectively. The quality of the bowel preparation is poor with large amount of retained barium. Automated CO2 insufflation of the colon was performed prior to image acquisition and colonic distention is poor. Image post processing was used to generate a 3D endoluminal fly-through projection of the colon and to electronically subtract stool/fluid as appropriate.  COMPARISON:  12/30/2019  FINDINGS: VIRTUAL COLONOSCOPY  Large amount of retained barium throughout the colon. There is poor distention of the colon, particularly the descending colon. There is a persistent area of annular constriction and wall thickening noted within the proximal to mid sigmoid colon in the area of prior abnormality. This  is concerning for annular neoplasm. This is several cm in length, at least 6 cm in length. No visible additional annular constricting lesions or persistent non mobile polypoid filling defect.  Virtual colonoscopy is not designed to detect diminutive polyps (i.e., less than or equal to 5 mm), the presence or absence of which may not affect clinical management.  CT ABDOMEN AND PELVIS WITHOUT CONTRAST  Lower chest: Lung bases are clear. No effusions. Heart is normal size. Small hiatal hernia.  Hepatobiliary: No focal hepatic abnormality. Gallbladder unremarkable.  Pancreas: No focal abnormality or ductal dilatation.  Spleen: No focal abnormality.  Normal size.  Adrenals/Urinary Tract: No adrenal abnormality. No focal renal abnormality. No stones or hydronephrosis. Urinary bladder is unremarkable.  Stomach/Bowel: Stomach and small bowel decompressed, unremarkable. Appendix is normal.  Vascular/Lymphatic: No evidence of aneurysm or adenopathy.  Reproductive: Uterus and adnexa unremarkable.  No mass.  Other: No free fluid or free  air.  Musculoskeletal: No acute bony abnormality.  IMPRESSION: Long segment area of wall thickening and annular constriction within the sigmoid colon in the area of previous concern most compatible with annular malignancy.  Remainder of the colon evaluation is somewhat limited due to large amount of retained stool and incomplete distension proximal to the annular constricting lesion.  I reviewed the pathology from her biopsy. COLON MASS, SIGMOID AT 25 CM; COLD BIOPSY:  - BENIGN COLONIC MUCOSA WITH SUPERFICIAL HYPERPLASTIC CHANGES.  - NO EVIDENCE OF DYSPLASIA OR MALIGNANCY.  Despite this finding, the clinical appearance of the lesion as well as the radiographic imaging is still quite suggestive of a colonic malignancy.  Results for RENESHA, MAPSON (MRN [id number]) as of 01/05/2020 15:47  Ref. Range 11/22/2019 07:04  CEA Latest Ref Range: 0.0 - 4.7 ng/mL 7.9 (H)  This CEA is elevated, in keeping with the presumed diagnosis of colon cancer.  Assessment This is a 54 year old woman who underwent endoscopy to evaluate for anemia.  She was found to have a colon mass.  Despite the fact that biopsy was negative for malignancy, it is believed by all of the practitioners involved, including gastroenterology, oncology, and myself as the surgeon, that this most likely represents a primary colon cancer.  I have recommended that she undergo resection.  Plan With the assistance of the interpreter, I discussed the risks of the operation with the patient and her daughter.  These risks include, but are not limited to, bleeding, infection, damage to surrounding structures including the ureter, anastomotic leak, need for potential diverting ileostomy, need for additional treatment and/or surgical procedures, as well as the risks of general anesthesia.  She and her daughter had the opportunity to ask any questions and these were answered to their satisfaction.  We will coordinate with urology to have a  stent placed versus ICG for assistance with ureteral identification during the operation.  She was provided with bowel prep prescription and instructions.  We will schedule her for an open sigmoid colectomy at a mutually convenient date.    [name] 01/05/2020, 1:21 PM

## 2020-01-10 ENCOUNTER — Telehealth: Payer: Self-pay | Admitting: General Surgery

## 2020-01-10 ENCOUNTER — Telehealth: Payer: Self-pay | Admitting: *Deleted

## 2020-01-10 NOTE — Telephone Encounter (Signed)
None of her medication was prescribed by me though. Would like to figure out what medication.  Also please find out when is her surgery date, would like to see her 1 week after surgery to go over pathology report and adjuvant plans.

## 2020-01-10 NOTE — Telephone Encounter (Signed)
Almyra Free at [facility] has advised patient of Pre-Admission date/time, COVID Testing date and Surgery date.  Surgery Date: 01/30/20 Preadmission Testing Date: 01/20/20 (phone 1p-5p) Covid Testing Date: Not needed per Pre-Admit, patient with positive Covid test -    Patient has been made aware to call [phone], between 1-3:00pm the day before surgery, to find out what time to arrive for surgery.

## 2020-01-10 NOTE — Telephone Encounter (Signed)
Attempted to call patient and son multiple times with no response.

## 2020-01-10 NOTE — Telephone Encounter (Signed)
Son called reporting that one of her medications is causing her to have back pain and that they spoke with the surgeon about it and were told to contact our office about a possible reduced dose of the medicine. He could not tell me the name of the medicine. Please advise

## 2020-01-11 NOTE — Telephone Encounter (Signed)
Done.Marland Kitchen.   (Interpreter Requested) MD only on 6/2 @ 2:30 (per pt request). Appt  has been scheduled as requested.

## 2020-01-11 NOTE — Telephone Encounter (Signed)
Spoke to patient, who states that iron pills were giving her back pain. She contacted her PCP (Dr. Posey Pronto) at [facility] and was told to Dc medication.   Pt scheduled to have surgery on 5/24. Ellison Hughs, please schedule patient for MD only (interpreter needed) on 6/2 @ 2:30 (per pt request). Pt is aware and I will mail AVS. Thanks.

## 2020-01-20 ENCOUNTER — Other Ambulatory Visit: Payer: Self-pay

## 2020-01-20 ENCOUNTER — Encounter
Admission: RE | Admit: 2020-01-20 | Discharge: 2020-01-20 | Disposition: A | Discharge status: Home / Self Care | Payer: Self-pay | Source: Ambulatory Visit | Attending: General Surgery | Admitting: General Surgery

## 2020-01-20 HISTORY — DX: Anemia, unspecified: D64.9

## 2020-01-20 NOTE — Patient Instructions (Signed)
Your procedure is scheduled on: Monday Jan 30, 2020 Su procedimiento est programado para: Lunes 24 de Mayo del 2021 Report to Day Surgery inside [facility] 2nd floor. Presntese a: Designer, fashion/clothing en el 2ndo piso.  To find out your arrival time please call [phone] between 1PM - 3PM on Friday Jan 27, 2020. Para saber su hora de llegada por favor llame al [phone] entre la 1PM - 3PM el da: Viernes 21 de Mayo del 2021  Remember: Instructions that are not followed completely may result in serious medical risk, up to and including death,  or upon the discretion of your surgeon and anesthesiologist your surgery may need to be rescheduled.  Recuerde: Las instrucciones que no se siguen completamente Heritage manager en un riesgo de salud grave, incluyendo hasta  la [place] o a discrecin de su cirujano y Environmental health practitioner, su ciruga se puede posponer.   __X_ 1.Do not eat food after midnight the night before your procedure. No    gum chewing or hard candies. You may drink clear liquids up to 2 hours     before you are scheduled to arrive for your surgery- DO not drink clear     Liquids within 2 hours of the start of your surgery.     Clear Liquids include:    water, apple juice without pulp, clear carbohydrate drink such as    Clearfast of [place], Black Coffee or Tea (Do not add anything to coffee or tea).      No coma nada despus de la medianoche de la noche anterior a su    procedimiento. No coma chicles ni caramelos duros. Puede tomar    lquidos claros hasta 2 horas antes de su hora programada de llegada al     hospital para su procedimiento. No tome lquidos claros durante el     transcurso de las 2 horas de su llegada programada al hospital para su     procedimiento, ya que esto puede llevar a que su procedimiento se    retrase o tenga que volver a Health and safety inspector.  Los lquidos claros incluyen:          - Agua o jugo de [place] sin pulpa          -  Bebidas claras con carbohidratos como ClearFast o Gatorade          - Caf negro o t claro (sin leche, sin cremas, no agregue nada al caf ni al t)  No tome nada que no est en esta lista.  Los pacientes con diabetes tipo 1 y tipo 2 solo deben Agricultural engineer.  Llame a la clnica de [facility] o a la unidad de Same Day Surgery si  tiene alguna pregunta sobre estas instrucciones.    __X__2. On the morning of surgery brush your teeth with toothpaste and water, you                 may rinse your mouth with mouthwash if you wish.  Do not swallow any toothpaste of mouthwash.    En la maana de la [place], lvese los dientes con pasta de dientes y [place],                        [place] enjuagarse la boca con enjuague bucal si lo desea. No ingiera ninguna pasta de dientes o enjuague bucal.              _X__ 3.Do Not  Smoke or use e-cigarettes For 24 Hours Prior to Your Surgery.    Do not use any chewable tobacco products for at least 6   hours prior to surgery.    No fume ni use cigarrillos electrnicos durante las 24 horas previas    a su [place].  No use ningn producto de tabaco masticable durante   al menos 6 horas antes de la [place].     __X_ 4. No alcohol for 24 hours before or after surgery.    No tome alcohol durante las 24 horas antes ni despus de la [place].   __x__ 5. Notify your doctor if there is any change in your medical condition (cold,fever, infections).    Informe a su mdico si hay algn cambio en su condicin mdica  (resfriado, fiebre, infecciones).   Do not wear jewelry, make-up, hairpins, clips or nail polish.  No use joyas, maquillajes, pinzas/ganchos para el cabello ni esmalte de uas.  Do not wear lotions, powders, or perfumes. You may wear deodorant.  No use lociones, polvos o perfumes.  Puede usar desodorante.    Do not shave 48 hours prior to surgery. Men may shave face and neck.  No se afeite 48 horas antes de la [place].  Los hombres pueden [employer] cara  y el  cuello.   Do not bring valuables to the hospital.   No lleve objetos [facility] is not responsible for any belongings or valuables.  [facility] no se hace responsable de ningn tipo de pertenencias u objetos de Geographical information systems officer.               Contacts, dentures or bridgework may not be worn into surgery.  Los lentes de [place], las dentaduras postizas o puentes no se pueden usar en la [place].   Leave your suitcase in the car. After surgery it may be brought to your room.  Deje su maleta en el auto.  Despus de la ciruga podr traerla a su habitacin.   For patients admitted to the hospital, discharge time is determined by your  treatment team.  Para los pacientes que sean ingresados al hospital, el tiempo en el cual se le  dar de alta es determinado por su equipo de [place].   Patients discharged the day of surgery will not be allowed to drive home. A los pacientes que se les da de alta el mismo da de la ciruga no se les permitir conducir a Holiday representative.    _x___ Take these medicines the morning of surgery with A SIP OF WATER:          [employer] medicinas la maana de la ciruga con UN SORBO DE AGUA:  1. levothyroxine (SYNTHROID) 125 MCG     __x__ Take your bowel prep as instructed by your doctor.    Tome sus medicamentos de preparacion como le indico su doctor.   __x__ Use CHG Soap as directed          Utilice el jabn de CHG segn lo indicado   __x__ Stop Anti-inflammatories such as Ibuprofen, Aleve, naproxen, aspirin and or BC powders.           Deje de tomar antiinflamatorios como Ibuprofen, Aleve, naproxen, aspirinas o polvos de BC powders.   __x__ Stop supplements until after surgery            Deje de tomar suplementos hasta despus de la ciruga  __x__ Do not start any herbal supplements before your surgery.  No empiece a tomar supplementos de hierbas antes de su cirugia.

## 2020-01-23 ENCOUNTER — Telehealth: Payer: Self-pay | Admitting: Emergency Medicine

## 2020-01-23 NOTE — Telephone Encounter (Signed)
Called pt to go over Bowel prep. No answer. Left vm to call back to the office. Also called pt daughter laura with no answer. Unable to left vm on her answering machine.

## 2020-01-24 NOTE — Telephone Encounter (Signed)
Called pt and went over Bowel prep instructions. Per patient she understood, read them back to me and has no further instructions. Pt advised to call the office back if any questions arise. Pt voiced understanding.

## 2020-01-26 ENCOUNTER — Other Ambulatory Visit: Payer: Self-pay

## 2020-01-26 ENCOUNTER — Encounter
Admission: RE | Admit: 2020-01-26 | Discharge: 2020-01-26 | Disposition: A | Discharge status: Home / Self Care | Payer: Self-pay | Source: Ambulatory Visit | Attending: General Surgery | Admitting: General Surgery

## 2020-01-26 DIAGNOSIS — Z01812 Encounter for preprocedural laboratory examination: Secondary | ICD-10-CM | POA: Insufficient documentation

## 2020-01-26 LAB — TYPE AND SCREEN
ABO/RH(D): O POS
Antibody Screen: NEGATIVE
Extend sample reason: TRANSFUSED

## 2020-01-29 ENCOUNTER — Other Ambulatory Visit: Payer: Self-pay

## 2020-01-29 ENCOUNTER — Emergency Department: Payer: Self-pay

## 2020-01-29 ENCOUNTER — Inpatient Hospital Stay
Admission: EM | Admit: 2020-01-29 | Discharge: 2020-02-14 | DRG: 330 | Disposition: A | Discharge status: Home Health | Payer: Self-pay | Attending: General Surgery | Admitting: General Surgery

## 2020-01-29 ENCOUNTER — Encounter: Payer: Self-pay | Admitting: Radiology

## 2020-01-29 DIAGNOSIS — I959 Hypotension, unspecified: Secondary | ICD-10-CM | POA: Diagnosis not present

## 2020-01-29 DIAGNOSIS — Z833 Family history of diabetes mellitus: Secondary | ICD-10-CM

## 2020-01-29 DIAGNOSIS — T8143XA Infection following a procedure, organ and space surgical site, initial encounter: Secondary | ICD-10-CM

## 2020-01-29 DIAGNOSIS — Z419 Encounter for procedure for purposes other than remedying health state, unspecified: Secondary | ICD-10-CM

## 2020-01-29 DIAGNOSIS — U071 COVID-19: Secondary | ICD-10-CM | POA: Diagnosis present

## 2020-01-29 DIAGNOSIS — R112 Nausea with vomiting, unspecified: Secondary | ICD-10-CM

## 2020-01-29 DIAGNOSIS — T8141XA Infection following a procedure, superficial incisional surgical site, initial encounter: Secondary | ICD-10-CM | POA: Diagnosis not present

## 2020-01-29 DIAGNOSIS — L8990 Pressure ulcer of unspecified site, unspecified stage: Secondary | ICD-10-CM | POA: Diagnosis not present

## 2020-01-29 DIAGNOSIS — E039 Hypothyroidism, unspecified: Secondary | ICD-10-CM | POA: Diagnosis present

## 2020-01-29 DIAGNOSIS — C187 Malignant neoplasm of sigmoid colon: Principal | ICD-10-CM | POA: Diagnosis present

## 2020-01-29 DIAGNOSIS — N390 Urinary tract infection, site not specified: Secondary | ICD-10-CM | POA: Diagnosis present

## 2020-01-29 DIAGNOSIS — R109 Unspecified abdominal pain: Secondary | ICD-10-CM

## 2020-01-29 DIAGNOSIS — D500 Iron deficiency anemia secondary to blood loss (chronic): Secondary | ICD-10-CM | POA: Diagnosis present

## 2020-01-29 DIAGNOSIS — R319 Hematuria, unspecified: Secondary | ICD-10-CM | POA: Diagnosis not present

## 2020-01-29 DIAGNOSIS — K5669 Other partial intestinal obstruction: Secondary | ICD-10-CM

## 2020-01-29 DIAGNOSIS — K6389 Other specified diseases of intestine: Secondary | ICD-10-CM | POA: Diagnosis present

## 2020-01-29 DIAGNOSIS — Z8616 Personal history of COVID-19: Secondary | ICD-10-CM

## 2020-01-29 DIAGNOSIS — R509 Fever, unspecified: Secondary | ICD-10-CM

## 2020-01-29 DIAGNOSIS — K567 Ileus, unspecified: Secondary | ICD-10-CM | POA: Diagnosis not present

## 2020-01-29 DIAGNOSIS — E162 Hypoglycemia, unspecified: Secondary | ICD-10-CM | POA: Diagnosis not present

## 2020-01-29 DIAGNOSIS — Z6837 Body mass index (BMI) 37.0-37.9, adult: Secondary | ICD-10-CM

## 2020-01-29 DIAGNOSIS — Z79890 Hormone replacement therapy (postmenopausal): Secondary | ICD-10-CM

## 2020-01-29 DIAGNOSIS — K56609 Unspecified intestinal obstruction, unspecified as to partial versus complete obstruction: Secondary | ICD-10-CM | POA: Diagnosis present

## 2020-01-29 DIAGNOSIS — Z4659 Encounter for fitting and adjustment of other gastrointestinal appliance and device: Secondary | ICD-10-CM

## 2020-01-29 DIAGNOSIS — R058 Other specified cough: Secondary | ICD-10-CM

## 2020-01-29 LAB — URINALYSIS, COMPLETE (UACMP) WITH MICROSCOPIC
Bacteria, UA: NONE SEEN
Bilirubin Urine: NEGATIVE
Glucose, UA: NEGATIVE mg/dL
Hgb urine dipstick: NEGATIVE
Ketones, ur: NEGATIVE mg/dL
Leukocytes,Ua: NEGATIVE
Nitrite: NEGATIVE
Protein, ur: NEGATIVE mg/dL
Specific Gravity, Urine: 1.025 (ref 1.005–1.030)
Squamous Epithelial / HPF: NONE SEEN (ref 0–5)
pH: 7 (ref 5.0–8.0)

## 2020-01-29 LAB — COMPREHENSIVE METABOLIC PANEL
ALT: 15 U/L (ref 0–44)
AST: 24 U/L (ref 15–41)
Albumin: 4.3 g/dL (ref 3.5–5.0)
Alkaline Phosphatase: 71 U/L (ref 38–126)
Anion gap: 10 (ref 5–15)
BUN: 8 mg/dL (ref 6–20)
CO2: 25 mmol/L (ref 22–32)
Calcium: 8.8 mg/dL — ABNORMAL LOW (ref 8.9–10.3)
Chloride: 104 mmol/L (ref 98–111)
Creatinine, Ser: 0.46 mg/dL (ref 0.44–1.00)
GFR calc Af Amer: >60 mL/min (ref >60)
GFR calc non Af Amer: >60 mL/min (ref >60)
Glucose, Bld: 115 mg/dL — ABNORMAL HIGH (ref 70–99)
Potassium: 3.7 mmol/L (ref 3.5–5.1)
Sodium: 139 mmol/L (ref 135–145)
Total Bilirubin: 0.6 mg/dL (ref 0.3–1.2)
Total Protein: 8 g/dL (ref 6.5–8.1)

## 2020-01-29 LAB — CBC
HCT: 37.8 % (ref 36.0–46.0)
Hemoglobin: 12.5 g/dL (ref 12.0–15.0)
MCH: 27.2 pg (ref 26.0–34.0)
MCHC: 33.1 g/dL (ref 30.0–36.0)
MCV: 82.4 fL (ref 80.0–100.0)
Platelets: 224 10*3/uL (ref 150–400)
RBC: 4.59 MIL/uL (ref 3.87–5.11)
RDW: 19.1 % — ABNORMAL HIGH (ref 11.5–15.5)
WBC: 6 10*3/uL (ref 4.0–10.5)
nRBC: 0 % (ref 0.0–0.2)

## 2020-01-29 LAB — LIPASE, BLOOD: Lipase: 25 U/L (ref 11–51)

## 2020-01-29 MED ORDER — ONDANSETRON HCL 4 MG/2ML IJ SOLN
4.0000 mg | Freq: Once | INTRAMUSCULAR | Status: AC
  Administered 2020-01-29: 4 mg via INTRAVENOUS
  Filled 2020-01-29: qty 2

## 2020-01-29 MED ORDER — LACTATED RINGERS IV BOLUS
1000.0000 mL | Freq: Once | INTRAVENOUS | Status: AC
  Administered 2020-01-29: 1000 mL via INTRAVENOUS

## 2020-01-29 MED ORDER — SODIUM CHLORIDE 0.9 % IV SOLN: 2.0000 g | INTRAVENOUS | Status: DC

## 2020-01-29 MED ORDER — LEVOTHYROXINE SODIUM 137 MCG PO TABS
137.0000 ug | ORAL_TABLET | Freq: Every day | ORAL | Status: DC
  Administered 2020-01-31 – 2020-02-14 (×14): 137 ug via ORAL
  Filled 2020-01-29 (×17): qty 1

## 2020-01-29 MED ORDER — MORPHINE SULFATE (PF) 4 MG/ML IV SOLN
4.0000 mg | Freq: Once | INTRAVENOUS | Status: AC
  Administered 2020-01-29: 4 mg via INTRAVENOUS
  Filled 2020-01-29: qty 1

## 2020-01-29 MED ORDER — IOHEXOL 300 MG/ML  SOLN
100.0000 mL | Freq: Once | INTRAMUSCULAR | Status: AC | PRN
  Administered 2020-01-29: 100 mL via INTRAVENOUS

## 2020-01-29 MED ORDER — ONDANSETRON HCL 4 MG/2ML IJ SOLN: 4.0000 mg | Freq: Four times a day (QID) | INTRAMUSCULAR | Status: DC | PRN

## 2020-01-29 MED ORDER — MORPHINE SULFATE (PF) 2 MG/ML IV SOLN: 2.0000 mg | INTRAVENOUS | Status: DC | PRN

## 2020-01-29 NOTE — ED Triage Notes (Addendum)
Pt here for abdominal pain.  Here for generalized abdominal pain.  Has been doing bowel prep today.  Started with abdominal cramping since starting the prep.  Has had vomiting.  Feels like needing to have bowel movement but cannot.  Has not had bowel movement since liquid prep started at 3 pm.

## 2020-01-29 NOTE — ED Notes (Signed)
ED Provider at bedside.  Daughter at bedside, using interrupter, EMILY [phone]  Pt reports"really bad cramping in my tummy" Since 1700, intermittent pain, emesis started at same time, last BM at 1300 today,   Pt reports 2 BMs today prepping for surgery at 0600 Monday, reports urge to BM but unable to go  POC pt has blockage at mass in intestines that pt was scheduled to removed, surgery will consulted, pain control and admission

## 2020-01-29 NOTE — ED Notes (Signed)
Family at bedside.  Son, tex, at bedside  Admitting provider, Dr Flossie Buffy,  at bedside

## 2020-01-29 NOTE — ED Provider Notes (Signed)
[facility] Emergency Department Provider Note   ____________________________________________   First MD Initiated Contact with Patient 01/29/20 2145     (approximate)  I have reviewed the triage vital signs and the nursing notes.   HISTORY  Chief Complaint Abdominal Pain    HPI Debra [name] is a 54 y.o. female with past medical history of hypothyroidism and colon mass who presents to the ED complaining of abdominal pain.  History is limited due to language barrier, history obtained via interpreter [phone].  Patient reports she has had increasing abdominal pain for the past 6 to 8 hours.  Pain affects her abdomen diffusely and she feels like it is getting more swollen and firm.  There is been associated with multiple episodes of vomiting and she has been unable to tolerate any liquids or solids since onset of pain.  She reports having a normal bowel movement earlier this morning, but has not passed any stool since then.  She has not had any fevers, dysuria, hematuria, cough, chest pain, or shortness of breath.  She was scheduled for partial colectomy tomorrow due to colon mass presumed to be malignant.        Past Medical History:  Diagnosis Date  . Anemia   . Hypothyroidism   . Thyroid disease     Patient Active Problem List   Diagnosis Date Noted  . Bowel obstruction ([place]) 01/29/2020  . Iron deficiency anemia due to chronic blood loss 11/22/2019  . Colonic mass   . COVID-19 virus detected 11/20/2019  . Symptomatic anemia 11/20/2019  . Hypothyroidism 11/20/2019  . Iron deficiency anemia 11/20/2019  . GIB (gastrointestinal bleeding) 11/20/2019    Past Surgical History:  Procedure Laterality Date  . COLONOSCOPY WITH PROPOFOL N/A 11/21/2019   Procedure: COLONOSCOPY WITH PROPOFOL;  Surgeon: Lin Landsman, MD;  Location: [facility] ENDOSCOPY;  Service: Gastroenterology;  Laterality: N/A;  . ESOPHAGOGASTRODUODENOSCOPY (EGD) WITH PROPOFOL N/A  11/21/2019   Procedure: ESOPHAGOGASTRODUODENOSCOPY (EGD) WITH PROPOFOL;  Surgeon: Lin Landsman, MD;  Location: [facility] ENDOSCOPY;  Service: Gastroenterology;  Laterality: N/A;    Prior to Admission medications   Medication Sig Start Date End Date Taking? Authorizing Provider  levothyroxine (SYNTHROID) 137 MCG tablet Take 137 mcg by mouth daily before breakfast.    Yes [provider]  bisacodyl (DULCOLAX) 5 MG EC tablet Take all 4 tablets at 8 am the morning prior to your surgery. Patient not taking: Reported on 01/20/2020 01/05/20   [name], MD  erythromycin base (E-MYCIN) 500 MG tablet Take 2 tablets at 8am, 2 tablets at 2pm, and 2 tablets at 8pm the day prior to surgery. Patient not taking: Reported on 01/20/2020 01/05/20   [name], MD  ferrous sulfate 325 (65 FE) MG tablet Take 1 tablet (325 mg total) by mouth 2 (two) times daily with a meal. Patient not taking: Reported on 01/11/2020 11/22/19   Fritzi Mandes, MD  levothyroxine (SYNTHROID) 150 MCG tablet Take 150 mcg by mouth daily before breakfast.    [provider]  neomycin (MYCIFRADIN) 500 MG tablet Take 2 tablet at 8am, take 2 tablets at 2pm, and take 2 tablets at 8pm the day prior to your surgery Patient not taking: Reported on 01/29/2020 01/05/20   [name], MD  polyethylene glycol powder (MIRALAX) 17 GM/SCOOP powder Mix full container in 64 ounces of Gatorade or other clear liquid. NO Red Patient not taking: Reported on 01/29/2020 01/05/20   [name], MD  senna-docusate (SENOKOT-S) 8.6-50 MG  tablet Take 1 tablet by mouth at bedtime. Patient not taking: Reported on 01/11/2020 11/22/19   Fritzi Mandes, MD    Allergies Patient has no known allergies.  Family History  Problem Relation Age of Onset  . Diabetes Sister     Social History Social History   Tobacco Use  . Smoking status: Never Smoker  . Smokeless tobacco: Never Used  Substance Use Topics  . Alcohol use: Never  . Drug use:  Never    Review of Systems  Constitutional: No fever/chills Eyes: No visual changes. ENT: No sore throat. Cardiovascular: Denies chest pain. Respiratory: Denies shortness of breath. Gastrointestinal: Positive for abdominal pain, nausea, and vomiting.  No diarrhea.  No constipation. Genitourinary: Negative for dysuria. Musculoskeletal: Negative for back pain. Skin: Negative for rash. Neurological: Negative for headaches, focal weakness or numbness.  ____________________________________________   PHYSICAL EXAM:  VITAL SIGNS: ED Triage Vitals  Enc Vitals Group     BP 01/29/20 1955 (!) 156/78     Pulse Rate 01/29/20 1955 78     Resp 01/29/20 1955 16     Temp 01/29/20 1955 98.1 F (36.7 C)     Temp Source 01/29/20 1955 Oral     SpO2 01/29/20 1955 97 %     Weight 01/29/20 1956 190 lb (86.2 kg)     Height 01/29/20 1956 5' (1.524 m)     Head Circumference --      Peak Flow --      Pain Score 01/29/20 1955 9     Pain Loc --      Pain Edu? --      Excl. in [place]? --     Constitutional: Alert and oriented. Eyes: Conjunctivae are normal. Head: Atraumatic. Nose: No congestion/rhinnorhea. Mouth/Throat: Mucous membranes are moist. Neck: Normal ROM Cardiovascular: Normal rate, regular rhythm. Grossly normal heart sounds. Respiratory: Normal respiratory effort.  No retractions. Lungs CTAB. Gastrointestinal: Soft and diffusely tender to palpation with distention. Genitourinary: deferred Musculoskeletal: No lower extremity tenderness nor edema. Neurologic:  Normal speech and language. No gross focal neurologic deficits are appreciated. Skin:  Skin is warm, dry and intact. No rash noted. Psychiatric: Mood and affect are normal. Speech and behavior are normal.  ____________________________________________   LABS (all labs ordered are listed, but only abnormal results are displayed)  Labs Reviewed  COMPREHENSIVE METABOLIC PANEL - Abnormal; Notable for the following components:       Result Value   Glucose, Bld 115 (*)    Calcium 8.8 (*)    All other components within normal limits  CBC - Abnormal; Notable for the following components:   RDW 19.1 (*)    All other components within normal limits  URINALYSIS, COMPLETE (UACMP) WITH MICROSCOPIC - Abnormal; Notable for the following components:   Color, Urine COLORLESS (*)    APPearance CLEAR (*)    All other components within normal limits  SARS CORONAVIRUS 2 BY RT PCR (HOSPITAL ORDER, [place] LAB)  LIPASE, BLOOD  BASIC METABOLIC PANEL  CBC    PROCEDURES  Procedure(s) performed (including Critical Care):  Procedures   ____________________________________________   INITIAL IMPRESSION / ASSESSMENT AND PLAN / ED COURSE       54 year old female with known sigmoid colon mass presents to the ED with increasing abdominal pain and vomiting throughout the day today.  Lab work is unremarkable, however CT scan performed from triage shows evidence of large intestinal obstruction near patient's known mass.  She was treated with IV  morphine and Zofran, hydrated with IV fluids.  Case was discussed with Dr. Dahlia Byes of general surgery, who recommends admission to medicine service.  We will hold off on NG tube for now unless patient were to develop recurrent nausea and vomiting.  Patient to be made n.p.o. at midnight in case they will decide to proceed with surgery tomorrow.  Case was discussed with hospitalist for admission.      ____________________________________________   FINAL CLINICAL IMPRESSION(S) / ED DIAGNOSES  Final diagnoses:  Large bowel obstruction [facility])     ED Discharge Orders    None       Note:  This document was prepared using Dragon voice recognition software and may include unintentional dictation errors.   Blake Divine, MD 01/30/20 [id number]

## 2020-01-29 NOTE — H&P (Signed)
History and Physical    Debra [name] [id number] DOB: Nov 12, 1965 DOA: 01/29/2020  PCP: [name], MD  Patient coming from: Home, son at bedside  I have personally briefly reviewed patient's old medical records in [place]  Chief Complaint: Abdominal pain  HPI: Debra [name] is a 54 y.o. female with medical history significant for Hx of sigmoid mass, hypothyroidism, iron deficiency anemia, recent COVID infection in March who presents with acute abdominal pain.  She was recently diagnosed with a sigmoid colonic mass in March after presenting with acute anemia requiring transfusion. Biopsy of sigmoid mass was negative but thought by GI, oncology and general surgery to likely be primary colon cancer. She has planned partial colectomy tomorrow 5/24.  Patient started bowel prep today and and began to have acute diffuse abdominal cramping pain as well as an episode of vomiting.  Her last bowel movement was several hours prior to the bowel prep today and states that it was dark but denies bright red blood per rectum.  No bowel movement after prep.  She is no longer on iron supplements and she says a physician took her off of it.  She denies any fever.  Patient has no history of prior abdominal surgery.  No family history of any colon cancer.  Spanish interpreter was used through [employer].  CT abdomen and pelvis was notable for significant fecal retention and colonic distention proximal to sigmoid mass with element of colonic obstruction She was afebrile, normotensive on room air. CBC and CMP unremarkable.   ED physician Dr. Charna Archer discussed findings with general surgery who will consult her tomorrow to discuss whether to proceed with colectomy.  Advised admission to medicine and to keep her n.p.o. with no NG tube needed at this time.  Given 1 L of LR, 4 mg morphine and 4 mg Zofran in the ED.   Review of Systems:  Constitutional: No Weight Change, No Fever ENT/Mouth: No  sore throat, No Rhinorrhea Eyes: No Eye Pain, No Vision Changes Cardiovascular: No Chest Pain, no SOB Respiratory: No Cough, No Sputum Gastrointestinal: + Nausea, + Vomiting, No Diarrhea, No Constipation, No Pain Genitourinary: no Urinary Incontinence Musculoskeletal: No Arthralgias, No Myalgias Skin: No Skin Lesions, No Pruritus, Neuro: no Weakness, No Numbness Psych: No Anxiety/Panic, No Depression, + decrease appetite Heme/Lymph: No Bruising, No Bleeding  Past Medical History:  Diagnosis Date  . Anemia   . Hypothyroidism   . Thyroid disease     Past Surgical History:  Procedure Laterality Date  . COLONOSCOPY WITH PROPOFOL N/A 11/21/2019   Procedure: COLONOSCOPY WITH PROPOFOL;  Surgeon: Lin Landsman, MD;  Location: [facility] ENDOSCOPY;  Service: Gastroenterology;  Laterality: N/A;  . ESOPHAGOGASTRODUODENOSCOPY (EGD) WITH PROPOFOL N/A 11/21/2019   Procedure: ESOPHAGOGASTRODUODENOSCOPY (EGD) WITH PROPOFOL;  Surgeon: Lin Landsman, MD;  Location: [facility] ENDOSCOPY;  Service: Gastroenterology;  Laterality: N/A;     reports that she has never smoked. She has never used smokeless tobacco. She reports that she does not drink alcohol or use drugs.  No Known Allergies  Family History  Problem Relation Age of Onset  . Diabetes Sister      Prior to Admission medications   Medication Sig Start Date End Date Taking? Authorizing Provider  levothyroxine (SYNTHROID) 137 MCG tablet Take 137 mcg by mouth daily before breakfast.    Yes [provider]  bisacodyl (DULCOLAX) 5 MG EC tablet Take all 4 tablets at 8 am the morning prior to your surgery. Patient not taking: Reported  on 01/20/2020 01/05/20   [name], MD  erythromycin base (E-MYCIN) 500 MG tablet Take 2 tablets at 8am, 2 tablets at 2pm, and 2 tablets at 8pm the day prior to surgery. Patient not taking: Reported on 01/20/2020 01/05/20   [name], MD  ferrous sulfate 325 (65 FE) MG tablet Take 1 tablet (325  mg total) by mouth 2 (two) times daily with a meal. Patient not taking: Reported on 01/11/2020 11/22/19   Fritzi Mandes, MD  levothyroxine (SYNTHROID) 150 MCG tablet Take 150 mcg by mouth daily before breakfast.    [provider]  neomycin (MYCIFRADIN) 500 MG tablet Take 2 tablet at 8am, take 2 tablets at 2pm, and take 2 tablets at 8pm the day prior to your surgery Patient not taking: Reported on 01/29/2020 01/05/20   [name], MD  polyethylene glycol powder (MIRALAX) 17 GM/SCOOP powder Mix full container in 64 ounces of Gatorade or other clear liquid. NO Red Patient not taking: Reported on 01/29/2020 01/05/20   [name], MD  senna-docusate (SENOKOT-S) 8.6-50 MG tablet Take 1 tablet by mouth at bedtime. Patient not taking: Reported on 01/11/2020 11/22/19   Fritzi Mandes, MD    Physical Exam: Vitals:   01/29/20 1955 01/29/20 1956 01/29/20 2246 01/29/20 2320  BP: (!) 156/78  (!) 146/80   Pulse: 78  69 77  Resp: 16  18 16   Temp: 98.1 F (36.7 C)     TempSrc: Oral     SpO2: 97%  100% 99%  Weight:  86.2 kg    Height:  5' (1.524 m)      Constitutional: NAD, calm, comfortable, nontoxic appearing obese female laying on her left side in bed Vitals:   01/29/20 1955 01/29/20 1956 01/29/20 2246 01/29/20 2320  BP: (!) 156/78  (!) 146/80   Pulse: 78  69 77  Resp: 16  18 16   Temp: 98.1 F (36.7 C)     TempSrc: Oral     SpO2: 97%  100% 99%  Weight:  86.2 kg    Height:  5' (1.524 m)     Eyes: PERRL, lids and conjunctivae normal ENMT: Mucous membranes are moist.  Neck: normal, supple Respiratory: clear to auscultation bilaterally, no wheezing, no crackles. Normal respiratory effort. No accessory muscle use.  Cardiovascular: Regular rate and rhythm, no murmurs / rubs / gallops. No extremity edema. 2+ pedal pulses.   Abdomen: Mild diffuse tenderness, no masses palpated.  Bowel sounds positive on right quadrants but no bowel sounds heard to the left lower quadrant. Musculoskeletal:  no clubbing / cyanosis. No joint deformity upper and lower extremities. Good ROM, no contractures. Normal muscle tone.  Skin: no rashes, lesions, ulcers. No induration Neurologic: CN 2-12 grossly intact. Sensation intact. Strength 5/5 in all 4.  Psychiatric: Normal judgment and insight. Alert and oriented x 3. Normal mood.     Labs on Admission: I have personally reviewed following labs and imaging studies  CBC: Recent Labs  Lab 01/29/20 2008  WBC 6.0  HGB 12.5  HCT 37.8  MCV 82.4  PLT [id number]   Basic Metabolic Panel: Recent Labs  Lab 01/29/20 2008  NA 139  K 3.7  CL 104  CO2 25  GLUCOSE 115*  BUN 8  CREATININE 0.46  CALCIUM 8.8*   GFR: Estimated Creatinine Clearance: 77.5 mL/min (by C-G formula based on SCr of 0.46 mg/dL). Liver Function Tests: Recent Labs  Lab 01/29/20 2008  AST 24  ALT 15  ALKPHOS 71  BILITOT 0.6  PROT 8.0  ALBUMIN 4.3   Recent Labs  Lab 01/29/20 2008  LIPASE 25   No results for input(s): AMMONIA in the last 168 hours. Coagulation Profile: No results for input(s): INR, PROTIME in the last 168 hours. Cardiac Enzymes: No results for input(s): CKTOTAL, CKMB, CKMBINDEX, TROPONINI in the last 168 hours. BNP (last 3 results) No results for input(s): PROBNP in the last 8760 hours. HbA1C: No results for input(s): HGBA1C in the last 72 hours. CBG: No results for input(s): GLUCAP in the last 168 hours. Lipid Profile: No results for input(s): CHOL, HDL, LDLCALC, TRIG, CHOLHDL, LDLDIRECT in the last 72 hours. Thyroid Function Tests: No results for input(s): TSH, T4TOTAL, FREET4, T3FREE, THYROIDAB in the last 72 hours. Anemia Panel: No results for input(s): VITAMINB12, FOLATE, FERRITIN, TIBC, IRON, RETICCTPCT in the last 72 hours. Urine analysis:    Component Value Date/Time   COLORURINE YELLOW (A) 11/20/2019 0859   APPEARANCEUR CLEAR (A) 11/20/2019 0859   LABSPEC 1.012 11/20/2019 0859   PHURINE 7.0 11/20/2019 [place]  11/20/2019 0859   HGBUR NEGATIVE 11/20/2019 0859   [place] NEGATIVE 11/20/2019 0859   KETONESUR NEGATIVE 11/20/2019 0859   PROTEINUR 30 (A) 11/20/2019 0859   NITRITE NEGATIVE 11/20/2019 0859   LEUKOCYTESUR NEGATIVE 11/20/2019 0859    Radiological Exams on Admission: CT ABDOMEN PELVIS W CONTRAST  Result Date: 01/29/2020 CLINICAL DATA:  Generalized abdominal pain, vomiting, sigmoid mass suspicious for malignancy EXAM: CT ABDOMEN AND PELVIS WITH CONTRAST TECHNIQUE: Multidetector CT imaging of the abdomen and pelvis was performed using the standard protocol following bolus administration of intravenous contrast. CONTRAST:  130mL OMNIPAQUE IOHEXOL 300 MG/ML  SOLN COMPARISON:  01/03/2020 FINDINGS: Lower chest: No acute pleural or parenchymal lung disease. Hepatobiliary: No focal liver abnormality is seen. No gallstones, gallbladder wall thickening, or biliary dilatation. Pancreas: Unremarkable. No pancreatic ductal dilatation or surrounding inflammatory changes. Spleen: Normal in size without focal abnormality. Adrenals/Urinary Tract: Adrenal glands are unremarkable. Kidneys are normal, without renal calculi, focal lesion, or hydronephrosis. Bladder is unremarkable. Stomach/Bowel: Segmental circumferential wall thickening within the proximal sigmoid colon is again seen, measuring 5 cm in length, concerning for malignancy. There is significant proximal colonic distention with large amount of retained stool. There may be an element of obstruction related to the sigmoid mass. Normal appendix right lower quadrant. Vascular/Lymphatic: No significant vascular findings are present. No enlarged abdominal or pelvic lymph nodes. Reproductive: Uterus and bilateral adnexa are unremarkable. Other: No abdominal wall hernia or abnormality. No abdominopelvic ascites. Musculoskeletal: No acute or destructive bony lesions. Reconstructed images demonstrate no additional findings. IMPRESSION: 1. Circumferential wall  thickening proximal sigmoid colon concerning for malignancy. No change since prior study. 2. Significant fecal retention and colonic distention proximal to the sigmoid mass, consistent with an element of colonic obstruction. Electronically Signed   By: Randa Ngo M.D.   On: 01/29/2020 20:42      Assessment/Plan  Colonic obstruction secondary to recent dx sigmoid mass Keep NPO Surgery to evaluate tomorrow whether to proceed with colectomy  No NG tube unless active vomiting  Recent COVID infection Positive in March. No repeat testing needing since she will likely still be positive.  Asymptomatic so no tx   Hypothyroidism  Continue levothyroxine when able to take p.o.  Hx of Iron deficiency anemia/hx of GI bleed Hbg normal on admission  DVT prophylaxis:SCDs Code Status: Full Family Communication: Plan discussed with patient and son at bedside  disposition Plan: Home with at least 2 midnight stays  Consults called: Surgery to consult in the morning Admission status: inpatient  Status is: Inpatient  Remains inpatient appropriate because:IV treatments appropriate due to intensity of illness or inability to take PO and Inpatient level of care appropriate due to severity of illness   Dispo: The patient is from: Home              Anticipated d/c is to: Home              Anticipated d/c date is: 3 days              Patient currently is not medically stable to d/c.          Orene Desanctis DO [facility]   If 7PM-7AM, please contact night-coverage [email or web address]   01/29/2020, 11:37 PM

## 2020-01-30 ENCOUNTER — Other Ambulatory Visit: Payer: Self-pay

## 2020-01-30 ENCOUNTER — Inpatient Hospital Stay: Admission: RE | Admit: 2020-01-30 | Payer: Self-pay | Source: Home / Self Care | Admitting: General Surgery

## 2020-01-30 ENCOUNTER — Inpatient Hospital Stay: Payer: Self-pay | Admitting: Anesthesiology

## 2020-01-30 ENCOUNTER — Inpatient Hospital Stay: Payer: Self-pay

## 2020-01-30 ENCOUNTER — Encounter: Payer: Self-pay | Admitting: Family Medicine

## 2020-01-30 ENCOUNTER — Encounter
Admission: EM | Disposition: A | Discharge status: Home Health | Payer: Self-pay | Source: Home / Self Care | Attending: General Surgery

## 2020-01-30 DIAGNOSIS — C19 Malignant neoplasm of rectosigmoid junction: Secondary | ICD-10-CM

## 2020-01-30 DIAGNOSIS — K6389 Other specified diseases of intestine: Secondary | ICD-10-CM

## 2020-01-30 DIAGNOSIS — Z408 Encounter for other prophylactic surgery: Secondary | ICD-10-CM

## 2020-01-30 HISTORY — PX: PARTIAL COLECTOMY: SHX5273

## 2020-01-30 HISTORY — PX: CYSTOSCOPY WITH STENT PLACEMENT: SHX5790

## 2020-01-30 LAB — CREATININE, SERUM
Creatinine, Ser: 0.57 mg/dL (ref 0.44–1.00)
GFR calc Af Amer: >60 mL/min (ref >60)
GFR calc non Af Amer: >60 mL/min (ref >60)

## 2020-01-30 LAB — SARS CORONAVIRUS 2 BY RT PCR (HOSPITAL ORDER, PERFORMED IN ~~LOC~~ HOSPITAL LAB): SARS Coronavirus 2: POSITIVE — AB

## 2020-01-30 LAB — CBC
HCT: 41.1 % (ref 36.0–46.0)
Hemoglobin: 13.9 g/dL (ref 12.0–15.0)
MCH: 27.4 pg (ref 26.0–34.0)
MCHC: 33.8 g/dL (ref 30.0–36.0)
MCV: 80.9 fL (ref 80.0–100.0)
Platelets: 211 10*3/uL (ref 150–400)
RBC: 5.08 MIL/uL (ref 3.87–5.11)
RDW: 19.2 % — ABNORMAL HIGH (ref 11.5–15.5)
WBC: 14.9 10*3/uL — ABNORMAL HIGH (ref 4.0–10.5)
nRBC: 0 % (ref 0.0–0.2)

## 2020-01-30 SURGERY — COLECTOMY, PARTIAL
Anesthesia: General

## 2020-01-30 MED ORDER — KETAMINE HCL 50 MG/ML IJ SOLN
INTRAMUSCULAR | Status: AC
  Filled 2020-01-30: qty 10

## 2020-01-30 MED ORDER — ROCURONIUM BROMIDE 10 MG/ML (PF) SYRINGE
PREFILLED_SYRINGE | INTRAVENOUS | Status: AC
  Filled 2020-01-30: qty 10

## 2020-01-30 MED ORDER — PROPOFOL 10 MG/ML IV BOLUS
INTRAVENOUS | Status: AC
  Filled 2020-01-30: qty 20

## 2020-01-30 MED ORDER — CELECOXIB 200 MG PO CAPS
ORAL_CAPSULE | ORAL | Status: AC
  Administered 2020-01-30: 200 mg via ORAL
  Filled 2020-01-30: qty 1

## 2020-01-30 MED ORDER — SODIUM CHLORIDE (PF) 0.9 % IJ SOLN
INTRAMUSCULAR | Status: AC
  Filled 2020-01-30: qty 50

## 2020-01-30 MED ORDER — DEXTROSE IN LACTATED RINGERS 5 % IV SOLN: INTRAVENOUS | Status: DC

## 2020-01-30 MED ORDER — DEXAMETHASONE SODIUM PHOSPHATE 10 MG/ML IJ SOLN
INTRAMUSCULAR | Status: DC | PRN
  Administered 2020-01-30: 8 mg via INTRAVENOUS

## 2020-01-30 MED ORDER — PROPOFOL 500 MG/50ML IV EMUL
INTRAVENOUS | Status: DC | PRN
Start: 2020-01-30 — End: 2020-01-30
  Administered 2020-01-30: 100 ug/kg/min via INTRAVENOUS

## 2020-01-30 MED ORDER — BUPIVACAINE HCL (PF) 0.25 % IJ SOLN
INTRAMUSCULAR | Status: AC
  Filled 2020-01-30: qty 30

## 2020-01-30 MED ORDER — ALVIMOPAN 12 MG PO CAPS
12.0000 mg | ORAL_CAPSULE | Freq: Two times a day (BID) | ORAL | Status: DC
  Administered 2020-01-31 – 2020-02-01 (×3): 12 mg via ORAL
  Filled 2020-01-30 (×3): qty 1

## 2020-01-30 MED ORDER — CHLORHEXIDINE GLUCONATE CLOTH 2 % EX PADS: 6.0000 | MEDICATED_PAD | Freq: Every day | CUTANEOUS | Status: DC

## 2020-01-30 MED ORDER — HEMOSTATIC AGENTS (NO CHARGE) OPTIME
TOPICAL | Status: DC | PRN
  Administered 2020-01-30: 1 via TOPICAL

## 2020-01-30 MED ORDER — SCOPOLAMINE 1 MG/3DAYS TD PT72
MEDICATED_PATCH | TRANSDERMAL | Status: AC
  Filled 2020-01-30: qty 1

## 2020-01-30 MED ORDER — BUPIVACAINE HCL 0.25 % IJ SOLN
INTRAMUSCULAR | Status: DC | PRN
  Administered 2020-01-30: 30 mL

## 2020-01-30 MED ORDER — LACTATED RINGERS IV SOLN: INTRAVENOUS | Status: DC | PRN

## 2020-01-30 MED ORDER — MIDAZOLAM HCL 2 MG/2ML IJ SOLN
INTRAMUSCULAR | Status: AC
  Filled 2020-01-30: qty 2

## 2020-01-30 MED ORDER — FENTANYL CITRATE (PF) 100 MCG/2ML IJ SOLN
INTRAMUSCULAR | Status: AC
  Filled 2020-01-30: qty 2

## 2020-01-30 MED ORDER — KETAMINE HCL 50 MG/ML IJ SOLN
INTRAMUSCULAR | Status: DC | PRN
Start: 2020-01-30 — End: 2020-01-30
  Administered 2020-01-30: 50 mg via INTRAMUSCULAR

## 2020-01-30 MED ORDER — SCOPOLAMINE 1 MG/3DAYS TD PT72
1.0000 | MEDICATED_PATCH | TRANSDERMAL | Status: DC
  Administered 2020-01-30 – 2020-02-05 (×3): 1.5 mg via TRANSDERMAL
  Filled 2020-01-30 (×3): qty 1

## 2020-01-30 MED ORDER — PROPOFOL 500 MG/50ML IV EMUL
INTRAVENOUS | Status: AC
  Filled 2020-01-30: qty 50

## 2020-01-30 MED ORDER — GABAPENTIN 300 MG PO CAPS: 300.0000 mg | ORAL_CAPSULE | ORAL | Status: AC

## 2020-01-30 MED ORDER — LACTATED RINGERS IV SOLN: INTRAVENOUS | Status: DC

## 2020-01-30 MED ORDER — CELECOXIB 200 MG PO CAPS
200.0000 mg | ORAL_CAPSULE | Freq: Two times a day (BID) | ORAL | Status: DC
  Administered 2020-01-30 – 2020-02-14 (×27): 200 mg via ORAL
  Filled 2020-01-30 (×32): qty 1

## 2020-01-30 MED ORDER — CHLORHEXIDINE GLUCONATE CLOTH 2 % EX PADS
6.0000 | MEDICATED_PAD | Freq: Once | CUTANEOUS | Status: AC
  Administered 2020-01-30: 6 via TOPICAL

## 2020-01-30 MED ORDER — CELECOXIB 200 MG PO CAPS: 200.0000 mg | ORAL_CAPSULE | ORAL | Status: AC

## 2020-01-30 MED ORDER — ONDANSETRON HCL 4 MG/2ML IJ SOLN
4.0000 mg | Freq: Four times a day (QID) | INTRAMUSCULAR | Status: DC | PRN
  Administered 2020-02-02 – 2020-02-03 (×2): 4 mg via INTRAVENOUS
  Filled 2020-01-30: qty 2

## 2020-01-30 MED ORDER — DEXAMETHASONE SODIUM PHOSPHATE 10 MG/ML IJ SOLN
INTRAMUSCULAR | Status: AC
  Filled 2020-01-30: qty 1

## 2020-01-30 MED ORDER — HEPARIN SODIUM (PORCINE) 5000 UNIT/ML IJ SOLN: 5000.0000 [IU] | Freq: Once | INTRAMUSCULAR | Status: AC

## 2020-01-30 MED ORDER — KETOROLAC TROMETHAMINE 30 MG/ML IJ SOLN
INTRAMUSCULAR | Status: AC
  Filled 2020-01-30: qty 1

## 2020-01-30 MED ORDER — IOHEXOL 180 MG/ML  SOLN
INTRAMUSCULAR | Status: DC | PRN
  Administered 2020-01-30: 10 mL

## 2020-01-30 MED ORDER — MIDAZOLAM HCL 2 MG/2ML IJ SOLN
INTRAMUSCULAR | Status: DC | PRN
  Administered 2020-01-30: 2 mg via INTRAVENOUS

## 2020-01-30 MED ORDER — LIDOCAINE HCL (CARDIAC) PF 100 MG/5ML IV SOSY
PREFILLED_SYRINGE | INTRAVENOUS | Status: DC | PRN
  Administered 2020-01-30: 80 mg via INTRAVENOUS

## 2020-01-30 MED ORDER — ROCURONIUM BROMIDE 100 MG/10ML IV SOLN
INTRAVENOUS | Status: DC | PRN
  Administered 2020-01-30: 20 mg via INTRAVENOUS
  Administered 2020-01-30: 5 mg via INTRAVENOUS
  Administered 2020-01-30 (×3): 20 mg via INTRAVENOUS
  Administered 2020-01-30: 45 mg via INTRAVENOUS

## 2020-01-30 MED ORDER — ENOXAPARIN SODIUM 40 MG/0.4ML ~~LOC~~ SOLN
40.0000 mg | SUBCUTANEOUS | Status: DC
  Administered 2020-01-31 – 2020-02-07 (×8): 40 mg via SUBCUTANEOUS
  Filled 2020-01-30 (×8): qty 0.4

## 2020-01-30 MED ORDER — SODIUM CHLORIDE 0.9 % IV SOLN
INTRAVENOUS | Status: DC | PRN
  Administered 2020-01-30: 2 g via INTRAVENOUS

## 2020-01-30 MED ORDER — ONDANSETRON HCL 4 MG/2ML IJ SOLN: 4.0000 mg | Freq: Once | INTRAMUSCULAR | Status: DC | PRN

## 2020-01-30 MED ORDER — SODIUM CHLORIDE 0.9 % IV SOLN
INTRAVENOUS | Status: AC
  Filled 2020-01-30: qty 2

## 2020-01-30 MED ORDER — SUGAMMADEX SODIUM 200 MG/2ML IV SOLN
INTRAVENOUS | Status: DC | PRN
  Administered 2020-01-30: 200 mg via INTRAVENOUS

## 2020-01-30 MED ORDER — OXYCODONE HCL 5 MG/5ML PO SOLN: 5.0000 mg | Freq: Once | ORAL | Status: DC | PRN

## 2020-01-30 MED ORDER — EPHEDRINE 5 MG/ML INJ
INTRAVENOUS | Status: AC
  Filled 2020-01-30: qty 10

## 2020-01-30 MED ORDER — SIMETHICONE 80 MG PO CHEW: 40.0000 mg | CHEWABLE_TABLET | Freq: Four times a day (QID) | ORAL | Status: DC | PRN

## 2020-01-30 MED ORDER — ALVIMOPAN 12 MG PO CAPS
12.0000 mg | ORAL_CAPSULE | ORAL | Status: AC
  Administered 2020-01-30: 12 mg via ORAL

## 2020-01-30 MED ORDER — FENTANYL CITRATE (PF) 100 MCG/2ML IJ SOLN
25.0000 ug | INTRAMUSCULAR | Status: DC | PRN
  Administered 2020-01-30: 25 ug via INTRAVENOUS

## 2020-01-30 MED ORDER — SUCCINYLCHOLINE CHLORIDE 200 MG/10ML IV SOSY
PREFILLED_SYRINGE | INTRAVENOUS | Status: AC
  Filled 2020-01-30: qty 10

## 2020-01-30 MED ORDER — ALVIMOPAN 12 MG PO CAPS
ORAL_CAPSULE | ORAL | Status: AC
  Filled 2020-01-30: qty 1

## 2020-01-30 MED ORDER — ACETAMINOPHEN 500 MG PO TABS
1000.0000 mg | ORAL_TABLET | ORAL | Status: AC
  Administered 2020-01-30: 1000 mg via ORAL

## 2020-01-30 MED ORDER — BUPIVACAINE LIPOSOME 1.3 % IJ SUSP
INTRAMUSCULAR | Status: AC
  Filled 2020-01-30: qty 20

## 2020-01-30 MED ORDER — PROPOFOL 10 MG/ML IV BOLUS
INTRAVENOUS | Status: AC
  Filled 2020-01-30: qty 60

## 2020-01-30 MED ORDER — FAMOTIDINE 20 MG PO TABS: 20.0000 mg | ORAL_TABLET | Freq: Once | ORAL | Status: AC

## 2020-01-30 MED ORDER — GABAPENTIN 300 MG PO CAPS
300.0000 mg | ORAL_CAPSULE | Freq: Two times a day (BID) | ORAL | Status: DC
  Administered 2020-01-30 – 2020-02-14 (×26): 300 mg via ORAL
  Filled 2020-01-30 (×27): qty 1

## 2020-01-30 MED ORDER — GABAPENTIN 300 MG PO CAPS
ORAL_CAPSULE | ORAL | Status: AC
  Administered 2020-01-30: 300 mg via ORAL
  Filled 2020-01-30: qty 1

## 2020-01-30 MED ORDER — SODIUM CHLORIDE 0.9 % IV SOLN
INTRAVENOUS | Status: DC | PRN
  Administered 2020-01-30: 70 mL

## 2020-01-30 MED ORDER — ONDANSETRON HCL 4 MG/2ML IJ SOLN
4.0000 mg | Freq: Four times a day (QID) | INTRAMUSCULAR | Status: DC | PRN
  Administered 2020-02-03 (×2): 4 mg via INTRAVENOUS
  Filled 2020-01-30 (×3): qty 2

## 2020-01-30 MED ORDER — FENTANYL CITRATE (PF) 100 MCG/2ML IJ SOLN
INTRAMUSCULAR | Status: AC
  Administered 2020-01-30: 25 ug via INTRAVENOUS
  Filled 2020-01-30: qty 2

## 2020-01-30 MED ORDER — PHENYLEPHRINE HCL (PRESSORS) 10 MG/ML IV SOLN
INTRAVENOUS | Status: DC | PRN
  Administered 2020-01-30 (×2): 50 ug via INTRAVENOUS
  Administered 2020-01-30: 100 ug via INTRAVENOUS

## 2020-01-30 MED ORDER — PROPOFOL 10 MG/ML IV BOLUS
INTRAVENOUS | Status: DC | PRN
  Administered 2020-01-30: 30 ug via INTRAVENOUS
  Administered 2020-01-30: 150 ug via INTRAVENOUS

## 2020-01-30 MED ORDER — HYDROMORPHONE HCL 1 MG/ML IJ SOLN
0.5000 mg | INTRAMUSCULAR | Status: DC | PRN
  Administered 2020-01-30 – 2020-02-11 (×3): 0.5 mg via INTRAVENOUS
  Filled 2020-01-30 (×3): qty 0.5

## 2020-01-30 MED ORDER — ACETAMINOPHEN 500 MG PO TABS
1000.0000 mg | ORAL_TABLET | Freq: Four times a day (QID) | ORAL | Status: DC
  Administered 2020-01-30 – 2020-02-14 (×45): 1000 mg via ORAL
  Filled 2020-01-30 (×50): qty 2

## 2020-01-30 MED ORDER — ACETAMINOPHEN 500 MG PO TABS
ORAL_TABLET | ORAL | Status: AC
  Filled 2020-01-30: qty 2

## 2020-01-30 MED ORDER — HEPARIN SODIUM (PORCINE) 5000 UNIT/ML IJ SOLN
INTRAMUSCULAR | Status: AC
  Administered 2020-01-30: 5000 [IU] via SUBCUTANEOUS
  Filled 2020-01-30: qty 1

## 2020-01-30 MED ORDER — ONDANSETRON 4 MG PO TBDP: 4.0000 mg | ORAL_TABLET | Freq: Four times a day (QID) | ORAL | Status: DC | PRN

## 2020-01-30 MED ORDER — FAMOTIDINE 20 MG PO TABS
ORAL_TABLET | ORAL | Status: AC
  Administered 2020-01-30: 20 mg via ORAL
  Filled 2020-01-30: qty 1

## 2020-01-30 MED ORDER — KETOROLAC TROMETHAMINE 30 MG/ML IJ SOLN
30.0000 mg | Freq: Four times a day (QID) | INTRAMUSCULAR | Status: AC | PRN
  Administered 2020-01-30 – 2020-02-01 (×2): 30 mg via INTRAVENOUS
  Filled 2020-01-30: qty 1

## 2020-01-30 MED ORDER — SUCCINYLCHOLINE CHLORIDE 20 MG/ML IJ SOLN
INTRAMUSCULAR | Status: DC | PRN
  Administered 2020-01-30: 100 mg via INTRAVENOUS

## 2020-01-30 MED ORDER — OXYCODONE HCL 5 MG PO TABS: 5.0000 mg | ORAL_TABLET | Freq: Once | ORAL | Status: DC | PRN

## 2020-01-30 MED ORDER — BUPIVACAINE LIPOSOME 1.3 % IJ SUSP: 20.0000 mL | Freq: Once | INTRAMUSCULAR | Status: DC

## 2020-01-30 MED ORDER — EPHEDRINE SULFATE 50 MG/ML IJ SOLN
INTRAMUSCULAR | Status: DC | PRN
  Administered 2020-01-30: 10 mg via INTRAVENOUS

## 2020-01-30 MED ORDER — FENTANYL CITRATE (PF) 100 MCG/2ML IJ SOLN
INTRAMUSCULAR | Status: DC | PRN
  Administered 2020-01-30 (×3): 25 ug via INTRAVENOUS
  Administered 2020-01-30: 50 ug via INTRAVENOUS
  Administered 2020-01-30 (×2): 25 ug via INTRAVENOUS
  Administered 2020-01-30: 50 ug via INTRAVENOUS

## 2020-01-30 SURGICAL SUPPLY — 89 items
ADAPTER CATH URET CONN 4-6FR (ADAPTER) ×1 IMPLANT
ADAPTER GOLDBERG URETERAL (ADAPTER) IMPLANT
BAG BILE T-TUBES STRL (MISCELLANEOUS) ×1 IMPLANT
BAG DRAIN CYSTO-URO LG1000N (MISCELLANEOUS) ×3 IMPLANT
BAG URINE DRAIN 2000ML AR STRL (UROLOGICAL SUPPLIES) ×3 IMPLANT
BAG URO DRAIN 2000ML W/SPOUT (MISCELLANEOUS) ×2 IMPLANT
BRUSH SCRUB EZ  4% CHG (MISCELLANEOUS) ×1
BRUSH SCRUB EZ 1% IODOPHOR (MISCELLANEOUS) ×3 IMPLANT
BRUSH SCRUB EZ 4% CHG (MISCELLANEOUS) ×2 IMPLANT
CANISTER SUCT 1200ML W/VALVE (MISCELLANEOUS) ×3 IMPLANT
CATH FOLEY 2WAY  5CC 16FR (CATHETERS) ×1
CATH FOLEY SIL 2WAY 14FR5CC (CATHETERS) IMPLANT
CATH URETL 5X70 OPEN END (CATHETERS) ×3 IMPLANT
CATH URTH 16FR FL 2W BLN LF (CATHETERS) IMPLANT
CHLORAPREP W/TINT 26 (MISCELLANEOUS) ×3 IMPLANT
CNTNR SPEC 2.5X3XGRAD LEK (MISCELLANEOUS) ×2
CONT SPEC 4OZ STER OR WHT (MISCELLANEOUS) ×1
CONTAINER SPEC 2.5X3XGRAD LEK (MISCELLANEOUS) IMPLANT
COVER BACK TABLE REUSABLE LG (DRAPES) ×3 IMPLANT
COVER WAND RF STERILE (DRAPES) ×3 IMPLANT
DRAPE LAPAROTOMY 100X77 ABD (DRAPES) ×3 IMPLANT
DRAPE LEGGINS SURG 28X43 STRL (DRAPES) ×3 IMPLANT
DRAPE UNDER BUTTOCK W/FLU (DRAPES) ×3 IMPLANT
DRSG TELFA 4X14 ISLAND NADH (GAUZE/BANDAGES/DRESSINGS) ×1 IMPLANT
DRSG TELFA 4X8 ISLAND PHMB (GAUZE/BANDAGES/DRESSINGS) ×4 IMPLANT
ELECT CAUTERY BLADE TIP 2.5 (TIP) ×3
ELECT EZSTD 165MM 6.5IN (MISCELLANEOUS) ×3
ELECT REM PT RETURN 9FT ADLT (ELECTROSURGICAL) ×3
ELECTRODE CAUTERY BLDE TIP 2.5 (TIP) ×2 IMPLANT
ELECTRODE EZSTD 165MM 6.5IN (MISCELLANEOUS) ×2 IMPLANT
ELECTRODE REM PT RTRN 9FT ADLT (ELECTROSURGICAL) ×2 IMPLANT
GAUZE 4X4 16PLY RFD (DISPOSABLE) ×1 IMPLANT
GLOVE BIO SURGEON STRL SZ 6.5 (GLOVE) ×9 IMPLANT
GLOVE INDICATOR 7.0 STRL GRN (GLOVE) ×6 IMPLANT
GOWN STRL REUS W/ TWL LRG LVL3 (GOWN DISPOSABLE) ×16 IMPLANT
GOWN STRL REUS W/TWL LRG LVL3 (GOWN DISPOSABLE) ×8
GUIDEWIRE STR DUAL SENSOR (WIRE) ×3 IMPLANT
HEMOSTAT SURGICEL 2X14 (HEMOSTASIS) ×1 IMPLANT
JELLY LUB 2OZ STRL (MISCELLANEOUS) ×1
JELLY LUBE 2OZ STRL (MISCELLANEOUS) ×2 IMPLANT
KIT IMAGING PINPOINTPAQ (MISCELLANEOUS) IMPLANT
KIT TURNOVER CYSTO (KITS) ×4 IMPLANT
LABEL OR SOLS (LABEL) ×3 IMPLANT
LIGASURE IMPACT 36 18CM CVD LR (INSTRUMENTS) ×1 IMPLANT
NDL KEITH SZ2.5 (NEEDLE) ×1 IMPLANT
NEEDLE HYPO 22GX1.5 SAFETY (NEEDLE) ×4 IMPLANT
NS IRRIG 1000ML POUR BTL (IV SOLUTION) ×3 IMPLANT
PACK BASIN MAJOR (MISCELLANEOUS) ×3 IMPLANT
PACK COLON CLEAN CLOSURE (MISCELLANEOUS) ×3 IMPLANT
PACK CYSTO AR (MISCELLANEOUS) ×3 IMPLANT
RELOAD LINEAR CUT PROX 55 BLUE (ENDOMECHANICALS) IMPLANT
RELOAD PROXIMATE 30MM BLUE (ENDOMECHANICALS) IMPLANT
RELOAD PROXIMATE 75MM BLUE (ENDOMECHANICALS) ×3 IMPLANT
RELOAD STAPLE 30 3.6 BLU REG (ENDOMECHANICALS) IMPLANT
RELOAD STAPLE 55 3.8 BLU REG (ENDOMECHANICALS) IMPLANT
RELOAD STAPLE 75 3.8 BLU REG (ENDOMECHANICALS) IMPLANT
SET CYSTO W/LG BORE CLAMP LF (SET/KITS/TRAYS/PACK) ×3 IMPLANT
SIDE-ARM FITTING (MISCELLANEOUS) IMPLANT
SOL .9 NS 3000ML IRR  AL (IV SOLUTION) ×1
SOL .9 NS 3000ML IRR UROMATIC (IV SOLUTION) ×2 IMPLANT
SOL PREP PVP 2OZ (MISCELLANEOUS) ×3
SOLUTION PREP PVP 2OZ (MISCELLANEOUS) ×2 IMPLANT
SPONGE LAP 18X18 RF (DISPOSABLE) ×6 IMPLANT
SPONGE LAP 18X36 RFD (DISPOSABLE) ×1 IMPLANT
STAPLER CIRCULAR MANUAL XL 29 (STAPLE) ×1 IMPLANT
STAPLER CUT CVD 40MM GREEN (STAPLE) ×1 IMPLANT
STAPLER GUN LINEAR PROX 60 (STAPLE) IMPLANT
STAPLER PROXIMATE 55 BLUE (STAPLE) IMPLANT
STAPLER PROXIMATE 75MM BLUE (STAPLE) ×1 IMPLANT
STAPLER SKIN PROX 35W (STAPLE) ×3 IMPLANT
STENT URET 6FRX24 CONTOUR (STENTS) IMPLANT
STENT URET 6FRX26 CONTOUR (STENTS) IMPLANT
SURGILUBE 2OZ TUBE FLIPTOP (MISCELLANEOUS) ×6 IMPLANT
SUT NYLON 2-0 (SUTURE) IMPLANT
SUT PDS AB 1 TP1 54 (SUTURE) ×4 IMPLANT
SUT PROLENE 0 CT 1 30 (SUTURE) IMPLANT
SUT SILK 0 (SUTURE)
SUT SILK 0 30XBRD TIE 6 (SUTURE) IMPLANT
SUT SILK 2 0 (SUTURE) ×1
SUT SILK 2-0 30XBRD TIE 12 (SUTURE) ×2 IMPLANT
SUT SILK 3-0 (SUTURE) ×5 IMPLANT
SUT VIC AB 3-0 SH 27 (SUTURE)
SUT VIC AB 3-0 SH 27X BRD (SUTURE) ×2 IMPLANT
SYR 20ML LL LF (SYRINGE) ×6 IMPLANT
SYR BULB IRRIG 60ML STRL (SYRINGE) ×1 IMPLANT
SYRINGE IRR TOOMEY STRL 70CC (SYRINGE) ×2 IMPLANT
TRAY FOLEY MTR SLVR 16FR STAT (SET/KITS/TRAYS/PACK) ×2 IMPLANT
TUBING ART PRESS 48 MALE/FEM (TUBING) ×1 IMPLANT
WATER STERILE IRR 1000ML POUR (IV SOLUTION) ×3 IMPLANT

## 2020-01-30 NOTE — Progress Notes (Signed)
PROGRESS NOTE    Debra [name]  PPJ:[id number] DOB: 08-30-1966 DOA: 01/29/2020 PCP: [name], MD   Chief Complaint: Nausea vomiting.  Brief Narrative: (Start on day 1 of progress note - keep it brief and live) Patient is a 54 year old female with history of hypothyroidism, iron deficient anemia, recent Covid infection.  Patient was also recently diagnosed with sigmoid mass, scheduled for surgery.  She came to the hospital complaining nausea vomiting after bowel prep.  She was brought to surgery 5/24 for total colectomy.   Assessment & Plan:   Principal Problem:   Bowel obstruction ([place]) Active Problems:   COVID-19 virus detected   Hypothyroidism   Colonic mass   Iron deficiency anemia due to chronic blood loss  #1.  Colorectal cancer. Status post colectomy.  Patient currently is quite sedated.  But the pain is under control.  We will continue to follow.  2.  Hypothyroidism. Continue home medicines.  3.  Persistent Covid infection. Patient was diagnosed with Covid in March.  Repeat Covid test still positive today.  She did not have any respiratory symptoms.  Will follow.     DVT prophylaxis: SCDs,  Code Status: Full Family Communication:None Disposition Plan:  . Patient came from: Home           . Anticipated d/c place: Home . Barriers to d/c OR conditions which need to be met to effect a safe d/c:   Consultants:     Procedures: Colectomy Antimicrobials: None  Subjective: Patient just to come out of surgery.  Still very sleepy.  Pain under control.  No hypoxemia or shortness of breath.  Objective: Vitals:   01/30/20 1353 01/30/20 1408 01/30/20 1423 01/30/20 1450  BP: 128/78 134/85 130/79 114/85  Pulse: 85 79 92 91  Resp: 20 19 (!) 21 16  Temp:   98 F (36.7 C) 97.9 F (36.6 C)  TempSrc:    Oral  SpO2: 100% 100% 94% 99%  Weight:      Height:        Intake/Output Summary (Last 24 hours) at 01/30/2020 1453 Last data filed at 01/30/2020  1427 Gross per 24 hour  Intake 3100 ml  Output 600 ml  Net 2500 ml   Filed Weights   01/29/20 1956 01/30/20 0637  Weight: 86.2 kg 86 kg    Examination:  General exam: Appears calm and comfortable  Respiratory system: Clear to auscultation. Respiratory effort normal. Cardiovascular system: S1 & S2 heard, RRR. No JVD, murmurs, rubs, gallops or clicks. No pedal edema. Gastrointestinal system: Abdomen is nondistended, soft and nontender. No organomegaly or masses felt. Normal bowel sounds heard. Central nervous system: Drowsy. No focal neurological deficits. Extremities: Symmetric 5 x 5 power. Skin: No rashes, lesions or ulcers Psychiatry: Not able to assess.    Data Reviewed: I have personally reviewed following labs and imaging studies  CBC: Recent Labs  Lab 01/29/20 2008  WBC 6.0  HGB 12.5  HCT 37.8  MCV 82.4  PLT 580   Basic Metabolic Panel: Recent Labs  Lab 01/29/20 2008  NA 139  K 3.7  CL 104  CO2 25  GLUCOSE 115*  BUN 8  CREATININE 0.46  CALCIUM 8.8*   GFR: Estimated Creatinine Clearance: 77.4 mL/min (by C-G formula based on SCr of 0.46 mg/dL). Liver Function Tests: Recent Labs  Lab 01/29/20 2008  AST 24  ALT 15  ALKPHOS 71  BILITOT 0.6  PROT 8.0  ALBUMIN 4.3   Recent Labs  Lab 01/29/20 2008  LIPASE 25   No results for input(s): AMMONIA in the last 168 hours. Coagulation Profile: No results for input(s): INR, PROTIME in the last 168 hours. Cardiac Enzymes: No results for input(s): CKTOTAL, CKMB, CKMBINDEX, TROPONINI in the last 168 hours. BNP (last 3 results) No results for input(s): PROBNP in the last 8760 hours. HbA1C: No results for input(s): HGBA1C in the last 72 hours. CBG: No results for input(s): GLUCAP in the last 168 hours. Lipid Profile: No results for input(s): CHOL, HDL, LDLCALC, TRIG, CHOLHDL, LDLDIRECT in the last 72 hours. Thyroid Function Tests: No results for input(s): TSH, T4TOTAL, FREET4, T3FREE, THYROIDAB in the  last 72 hours. Anemia Panel: No results for input(s): VITAMINB12, FOLATE, FERRITIN, TIBC, IRON, RETICCTPCT in the last 72 hours. Sepsis Labs: No results for input(s): PROCALCITON, LATICACIDVEN in the last 168 hours.  Recent Results (from the past 240 hour(s))  SARS Coronavirus 2 by RT PCR (hospital order, performed in [facility] hospital lab) Nasopharyngeal Urine, Clean Catch     Status: Abnormal   Collection Time: 01/29/20 11:02 PM   Specimen: Urine, Clean Catch; Nasopharyngeal  Result Value Ref Range Status   SARS Coronavirus 2 POSITIVE (A) NEGATIVE Final    Comment: RESULT CALLED TO, READ BACK BY AND VERIFIED WITH: NOEL WEBSTER 01/30/20 AT 0034 HS Performed at [facility], [address] [place], [place] [address]          Radiology Studies: CT ABDOMEN PELVIS W CONTRAST  Result Date: 01/29/2020 CLINICAL DATA:  Generalized abdominal pain, vomiting, sigmoid mass suspicious for malignancy EXAM: CT ABDOMEN AND PELVIS WITH CONTRAST TECHNIQUE: Multidetector CT imaging of the abdomen and pelvis was performed using the standard protocol following bolus administration of intravenous contrast. CONTRAST:  121m OMNIPAQUE IOHEXOL 300 MG/ML  SOLN COMPARISON:  01/03/2020 FINDINGS: Lower chest: No acute pleural or parenchymal lung disease. Hepatobiliary: No focal liver abnormality is seen. No gallstones, gallbladder wall thickening, or biliary dilatation. Pancreas: Unremarkable. No pancreatic ductal dilatation or surrounding inflammatory changes. Spleen: Normal in size without focal abnormality. Adrenals/Urinary Tract: Adrenal glands are unremarkable. Kidneys are normal, without renal calculi, focal lesion, or hydronephrosis. Bladder is unremarkable. Stomach/Bowel: Segmental circumferential wall thickening within the proximal sigmoid colon is again seen, measuring 5 cm in length, concerning for malignancy. There is significant proximal colonic distention with large amount of retained stool.  There may be an element of obstruction related to the sigmoid mass. Normal appendix right lower quadrant. Vascular/Lymphatic: No significant vascular findings are present. No enlarged abdominal or pelvic lymph nodes. Reproductive: Uterus and bilateral adnexa are unremarkable. Other: No abdominal wall hernia or abnormality. No abdominopelvic ascites. Musculoskeletal: No acute or destructive bony lesions. Reconstructed images demonstrate no additional findings. IMPRESSION: 1. Circumferential wall thickening proximal sigmoid colon concerning for malignancy. No change since prior study. 2. Significant fecal retention and colonic distention proximal to the sigmoid mass, consistent with an element of colonic obstruction. Electronically Signed   By: MRanda NgoM.D.   On: 01/29/2020 20:42   DG C-Arm 1-60 Min-No Report  Result Date: 01/30/2020 Fluoroscopy was utilized by the requesting physician.  No radiographic interpretation.        Scheduled Meds: . acetaminophen      . alvimopan      . ketorolac      . levothyroxine  137 mcg Oral QAC breakfast  . scopolamine  1 patch Transdermal Q72H  . scopolamine       Continuous Infusions: . cefoTEtan (CEFOTAN) 2 GM IVPB (Mini-Bag Plus)  LOS: 1 day    Time spent: 22 minutes    Sharen Hones, MD [facility]   To contact the attending provider between 7A-7P or the covering provider during after hours 7P-7A, please log into the web site [email or web address] and access using universal [place] password for that web site. If you do not have the password, please call the hospital operator.  01/30/2020, 2:53 PM

## 2020-01-30 NOTE — Op Note (Signed)
Operative Note  Preoperative Diagnosis: Colon cancer of the proximal sigmoid  Postoperative Diagnosis: Same  Operation: Open left hemicolectomy with mobilization of the splenic flexure  Surgeon: [name], MD  Assistant: Nestor Lewandowsky, MD (a second surgeon was necessary for exposure and to create the anastomosis)  Anesthesia: General endotracheal  Findings: At the proximal sigmoid colon, there was a firm fibrotic mass, consistent with the preoperative diagnosis of colon cancer.  The vascular supply to the descending colon was inadequate to support an anastomosis, therefore we mobilized the splenic flexure and of the colon in order to create an anastomosis between the distal transverse colon and the distal sigmoid colon.  Indications: This is a 54 year old woman who presented to the hospital with anemia back in March.  She underwent colonoscopy that revealed a near obstructing sigmoid mass.  Biopsies were inconclusive, but the clinical picture, as well as associated imaging, was all consistent with a primary colon cancer.  A virtual colonoscopy demonstrated no other lesions within the colon.  She also did not have any lesions in the liver or lungs, based on CT scan.  She was referred for partial colectomy.  She actually presented to the emergency department last night with nausea and vomiting, due to poor tolerance of her prep.  Imaging obtained in the emergency department demonstrated near complete obstruction.  Procedure In Detail: The patient was identified in the preoperative holding area and brought to the operating room where she was placed supine on the OR table.  Bony prominences were padded and bilateral sequential compression devices were placed on the lower extremities.  General endotracheal anesthesia was induced without incident.  Perioperative antibiotics were administered.  The patient was placed in lithotomy position.  Urology came into the room and placed a left-sided ureteral  stent as well as a Foley catheter.  Please see Dr. Cherrie Gauze documentation regarding her portion of the procedure.  Once Dr. Erlene Quan had completed stent placement, the patient was then positioned appropriately for a laparotomy.  A nasogastric tube was placed by anesthesiology.  A timeout was performed confirming the patient's identity, the procedure being performed, her allergies, all necessary equipment was available, and that maintenance anesthesia was adequate.  A vertical midline incision was made from just below the xiphoid process to just above the pubic bone, due to the patient's body habitus.  This was carried down through to the fascia using a combination of electrocautery and fat splitting technique.  The fascia was entered and opened along the length of the incision.  We then grasped the peritoneum and entered it sharply with Metzenbaum scissors.  Finger sweep was demonstrated no adhesions and therefore the abdomen was completely open.  A Thompson retractor was then brought into the field and placed as a self ret retaining device.  The abdomen was inspected.  The liver was without obvious lesions.  We then packed the small intestine into the right upper quadrant and placed the patient in Trendelenburg.  We began to mobilize the colon along the white line of Toldt.  The mass was identified at the proximal sigmoid.  It was quite bulky and made manipulation and dissection somewhat difficult.  We selected a space at the distal colon where we could divide the bowel.  Her margin was approximately 10 cm.  A contoured stapler was used to divide the colon at the distal sigmoid.  We were then better able to mobilize the proximal sigmoid and descending colon.  The mesentery was divided using the LigaSure device.  There were bulky nodes within the mesentery associated with the mass itself.  We continued to mobilize the distal descending colon however as we did so, it began to appear somewhat dusky, indicative of  poor blood supply.  We determined that we would need to use the distal transverse colon to make our anastomosis.  We continue to dissect up along the white line of Toldt to the splenic flexure of the colon.  We continue to mobilize the colon further working from the midpoint of the transverse colon.  We opened the lesser sac and proceeded to obtain an excellent length of transverse colon with which we could make our anastomosis.  We divided the proximal descending colon using a GIA stapler.  The remainder of the mesentery was divided.  The colon had adequate length to reach to our stapled off distal sigmoid without tension.  As we had substantial length on our distal sigmoid, we elected to perform an intra-abdominal anastomosis without using a transrectal approach.  We cleared the fat off of the sigmoid and use the pursestring or to place a nylon pursestring suture.  Dilators were then used and we determined that a 29 mm Endo GIA would be appropriate.  The anvil was secured with in the sigmoid.  We then made a colotomy in our distal transverse colon.  The stapler was advanced through the colotomy.  We brought the spike out through the tenia coli.  We then created the anastomosis with a single firing of the EEA stapler.  The colotomy was closed with another firing of the GIA.  Due to some fecal soilage, we then irrigated the abdomen with 3 L of warm saline.  We reevaluated our anastomosis and it was both intact and patent.  We changed our gowns and gloves for a clean closure.  100 mL of diluted liposomal bupivacaine were infiltrated along the fascial planes.  The fascia was closed with running #1 PDS.  The subcutaneum was then irrigated and closed with staples.  A sterile dressing was applied.  I removed the stent, leaving the Foley catheter.  The patient was then awakened, extubated, and taken to the postanesthesia care unit in good condition.  EBL: 350 mL  IVF: 2300 cc of crystalloid  UOP: 100 cc,  pink-tinged  Specimen(s): Left colon  Complications: none immediately apparent.   Counts: all needles, instruments, and sponges were counted and reported to be correct in number at the end of the case.   I was present for and participated in the entire operation.  [name] 12:44 PM

## 2020-01-30 NOTE — Transfer of Care (Signed)
Immediate Anesthesia Transfer of Care Note  Patient: Debra [name]  Procedure(s) Performed: PARTIAL COLECTOMY Sigmoid (N/A ) CYSTOSCOPY WITH STENT PLACEMENT (Left )  Patient Location: PACU  Anesthesia Type:General  Level of Consciousness: sedated  Airway & Oxygen Therapy: Patient Spontanous Breathing and Patient connected to face mask oxygen  Post-op Assessment: Report given to RN and Post -op Vital signs reviewed and stable  Post vital signs: Reviewed and stable  Last Vitals:  Vitals Value Taken Time  BP 121/79 01/30/20 1253  Temp 36.7 C 01/30/20 1253  Pulse 77 01/30/20 1301  Resp 16 01/30/20 1301  SpO2 100 % 01/30/20 1301  Vitals shown include unvalidated device data.  Last Pain:  Vitals:   01/30/20 1253  TempSrc:   PainSc: Asleep         Complications: No apparent anesthesia complications

## 2020-01-30 NOTE — Interval H&P Note (Signed)
History and Physical Interval Note:  01/30/2020 7:22 AM  Sundus Rae Mar  has presented today for surgery, with the diagnosis of Sigmoid colon mass.  The various methods of treatment have been discussed with the patient and family. After consideration of risks, benefits and other options for treatment, the patient has consented to  Procedure(s): PARTIAL COLECTOMY Sigmoid (N/A) DIVERTING ILEOSTOMY (N/A) CYSTOSCOPY WITH ICG POSS STENT PLACEMENT (Left) INDOCYANINE GREEN FLUORESCENCE IMAGING (ICG) (Left) as a surgical intervention.  The patient's history has been reviewed, patient examined, no change in status, stable for surgery.  I have reviewed the patient's chart and labs.  Questions were answered to the patient's satisfaction.    NOTE: because this is an open operation, ICG will not be used and a stent will be placed by Urology. This was an error in posting.  [name]

## 2020-01-30 NOTE — Anesthesia Procedure Notes (Addendum)
Procedure Name: Intubation Date/Time: 01/30/2020 7:51 AM Performed by: Allean Found, CRNA Pre-anesthesia Checklist: Patient identified, Patient being monitored, Timeout performed, Emergency Drugs available and Suction available Patient Re-evaluated:Patient Re-evaluated prior to induction Oxygen Delivery Method: Circle system utilized Preoxygenation: Pre-oxygenation with 100% oxygen Induction Type: IV induction, Rapid sequence and Cricoid Pressure applied Laryngoscope Size: 3 and McGraph Grade View: Grade I Tube type: Oral Tube size: 7.0 mm Number of attempts: 1 Airway Equipment and Method: Stylet Placement Confirmation: ETT inserted through vocal cords under direct vision,  positive ETCO2 and breath sounds checked- equal and bilateral Secured at: 21 cm Tube secured with: Tape Dental Injury: Teeth and Oropharynx as per pre-operative assessment  Comments: MV not attempted

## 2020-01-30 NOTE — Interval H&P Note (Signed)
Patient seen at bedside today with a Patent attorney.  Dr. Celine Ahr requesting left open-ended ureteral catheter for the purpose of ureteral identification for an open partial colectomy.  All risks of the procedure including hematuria, infection, damage to the ureter to, edema discussed.  She is agreeable to plan.

## 2020-01-30 NOTE — Progress Notes (Signed)
I spoke with Elmyra Ricks, infection prevention nurse. The patient does not meet the protocol to be placed on Airborne Precautions. Order received from Sharion Settler to discontinue isolation

## 2020-01-30 NOTE — Anesthesia Procedure Notes (Signed)
Performed by: Kadi Hession, CRNA       

## 2020-01-30 NOTE — Op Note (Signed)
Date of procedure: 01/30/20  Preoperative diagnosis:  1. Colonic mass  Postoperative diagnosis:  1. Same as above  Procedure: 1. Left retrograde pyelogram 2. Left open-ended ureteral catheter placement for the purpose of ureteral identification  Surgeon: Hollice Espy, MD  Anesthesia: General  Complications: None  Intraoperative findings: Unremarkable retrograde pyelogram  EBL: Minimal  Specimens: None  Drains: 5 French open-ended ureteral catheter in the left ureter, 16 French Foley catheter  Indication: Debra [name] is a 54 y.o. patient with obstructing colonic mass undergoing colectomy.  I was asked by Dr. Celine Ahr to place ureteral stents for the purpose of ureteral identification.  After reviewing the management options for treatment, she elected to proceed with the above surgical procedure(s). We have discussed the potential benefits and risks of the procedure, side effects of the proposed treatment, the likelihood of the patient achieving the goals of the procedure, and any potential problems that might occur during the procedure or recuperation. Informed consent has been obtained.  Description of procedure:  The patient was taken to the operating room and general anesthesia was induced.  The patient was placed in the dorsal lithotomy position, prepped and draped in the usual sterile fashion, and preoperative antibiotics were administered. A preoperative time-out was performed.   A 21 French scope was advanced per urethra into the bladder.  The bladder was unremarkable.  The left UO was cannulated using a 5 [place] open-ended ureteral catheter.  Contrast was injected up to the level of the kidney.  There is no hydroureteronephrosis or filling defects.  A sensor wire is in place up to level of the kidney.  The 5 [place] open-ended ureteral catheter was then advanced all the way up to level the renal pelvis without difficulty.  The wire was then removed.  The scope was  removed and the bladder was drained.  A 16 French Foley catheter was placed using sterile water in the balloon.  The open-ended ureteral catheter was then attached to the Foley catheter using silk ties x2 and a bile bag in tubing were applied to allow for drainage.  The remainder of the procedure was then performed by Dr. Celine Ahr.  She will remove the open-ended ureteral catheter at the end of the case.  Hollice Espy, M.D.

## 2020-01-30 NOTE — ED Notes (Signed)
CRITICAL LAB: COVID is POSITIVE, Lab, Dr. Flossie Buffy and Dr Beather Arbour notified,

## 2020-01-30 NOTE — Anesthesia Preprocedure Evaluation (Signed)
Anesthesia Evaluation  Patient identified by MRN, date of birth, ID band Patient awake    Reviewed: Allergy & Precautions, H&P , NPO status , Patient's Chart, lab work & pertinent test results, reviewed documented beta blocker date and time   History of Anesthesia Complications Negative for: history of anesthetic complications  Airway Mallampati: III  TM Distance: >3 FB Neck ROM: full    Dental  (+) Poor Dentition, Dental Advisory Given   Pulmonary neg pulmonary ROS, neg sleep apnea, neg COPD, Patient abstained from smoking.Not current smoker,    Pulmonary exam normal breath sounds clear to auscultation       Cardiovascular Exercise Tolerance: Good METS(-) hypertension(-) CAD and (-) Past MI negative cardio ROS Normal cardiovascular exam(-) dysrhythmias  Rhythm:regular Rate:Normal - Systolic murmurs    Neuro/Psych negative neurological ROS  negative psych ROS   GI/Hepatic Neg liver ROS, neg GERD  ,Obstructive colon mass   Endo/Other  neg diabetesHypothyroidism Morbid obesity  Renal/GU negative Renal ROS  negative genitourinary   Musculoskeletal   Abdominal   Peds  Hematology  (+) Blood dyscrasia, anemia ,   Anesthesia Other Findings Past Medical History: No date: Hypothyroidism No date: Thyroid disease History reviewed. No pertinent surgical history.   Reproductive/Obstetrics negative OB ROS                             Anesthesia Physical  Anesthesia Plan  ASA: III  Anesthesia Plan: General   Post-op Pain Management:    Induction: Intravenous, Rapid sequence and Cricoid pressure planned  PONV Risk Score and Plan: 4 or greater and Ondansetron, Dexamethasone, Propofol infusion, TIVA, Midazolam and Scopolamine patch - Pre-op  Airway Management Planned: Oral ETT and Video Laryngoscope Planned  Additional Equipment: None  Intra-op Plan:   Post-operative Plan: Extubation in  OR  Informed Consent: I have reviewed the patients History and Physical, chart, labs and discussed the procedure including the risks, benefits and alternatives for the proposed anesthesia with the patient or authorized representative who has indicated his/her understanding and acceptance.     Dental Advisory Given  Plan Discussed with: CRNA  Anesthesia Plan Comments: (Patient admitted overnight due to abdominal pain and vomiting after attempting to drink her bowel prep. No nausea or vomiting today after getting antiemetics.  Discussed risks of anesthesia with patient, including PONV, sore throat, lip/dental damage. Rare risks discussed as well, such as cardiorespiratory and neurological sequelae. Patient understands. Utilized in-person spanish interpreter for entire preop eval.)        Anesthesia Quick Evaluation

## 2020-01-30 NOTE — Anesthesia Postprocedure Evaluation (Signed)
Anesthesia Post Note  Patient: Debra [name]  Procedure(s) Performed: PARTIAL COLECTOMY Sigmoid (N/A ) CYSTOSCOPY WITH STENT PLACEMENT (Left )  Patient location during evaluation: PACU Anesthesia Type: General Level of consciousness: awake and alert Pain management: pain level controlled Vital Signs Assessment: post-procedure vital signs reviewed and stable Respiratory status: spontaneous breathing, nonlabored ventilation, respiratory function stable and patient connected to nasal cannula oxygen Cardiovascular status: blood pressure returned to baseline and stable Postop Assessment: no apparent nausea or vomiting Anesthetic complications: no     Last Vitals:  Vitals:   01/30/20 1308 01/30/20 1323  BP: 139/78 137/86  Pulse: 76 79  Resp: 18 19  Temp:    SpO2: 100% 100%    Last Pain:  Vitals:   01/30/20 1253  TempSrc:   PainSc: Asleep                 Arita Miss

## 2020-01-31 DIAGNOSIS — R319 Hematuria, unspecified: Secondary | ICD-10-CM

## 2020-01-31 DIAGNOSIS — N390 Urinary tract infection, site not specified: Secondary | ICD-10-CM

## 2020-01-31 LAB — CBC
HCT: 33.8 % — ABNORMAL LOW (ref 36.0–46.0)
Hemoglobin: 11.7 g/dL — ABNORMAL LOW (ref 12.0–15.0)
MCH: 27.7 pg (ref 26.0–34.0)
MCHC: 34.6 g/dL (ref 30.0–36.0)
MCV: 79.9 fL — ABNORMAL LOW (ref 80.0–100.0)
Platelets: 190 10*3/uL (ref 150–400)
RBC: 4.23 MIL/uL (ref 3.87–5.11)
RDW: 18.4 % — ABNORMAL HIGH (ref 11.5–15.5)
WBC: 17.5 10*3/uL — ABNORMAL HIGH (ref 4.0–10.5)
nRBC: 0 % (ref 0.0–0.2)

## 2020-01-31 LAB — URINALYSIS, COMPLETE (UACMP) WITH MICROSCOPIC
Bilirubin Urine: NEGATIVE
Glucose, UA: NEGATIVE mg/dL
Ketones, ur: NEGATIVE mg/dL
Leukocytes,Ua: NEGATIVE
Nitrite: NEGATIVE
Protein, ur: NEGATIVE mg/dL
Specific Gravity, Urine: 1.005 (ref 1.005–1.030)
Squamous Epithelial / HPF: NONE SEEN (ref 0–5)
pH: 6 (ref 5.0–8.0)

## 2020-01-31 LAB — BASIC METABOLIC PANEL
Anion gap: 6 (ref 5–15)
BUN: 13 mg/dL (ref 6–20)
CO2: 24 mmol/L (ref 22–32)
Calcium: 8.8 mg/dL — ABNORMAL LOW (ref 8.9–10.3)
Chloride: 107 mmol/L (ref 98–111)
Creatinine, Ser: 0.43 mg/dL — ABNORMAL LOW (ref 0.44–1.00)
GFR calc Af Amer: >60 mL/min (ref >60)
GFR calc non Af Amer: >60 mL/min (ref >60)
Glucose, Bld: 194 mg/dL — ABNORMAL HIGH (ref 70–99)
Potassium: 4.3 mmol/L (ref 3.5–5.1)
Sodium: 137 mmol/L (ref 135–145)

## 2020-01-31 LAB — MAGNESIUM: Magnesium: 1.9 mg/dL (ref 1.7–2.4)

## 2020-01-31 MED ORDER — SODIUM CHLORIDE 0.9 % IV SOLN
1.0000 g | INTRAVENOUS | Status: DC
  Administered 2020-01-31 – 2020-02-04 (×5): 1 g via INTRAVENOUS
  Filled 2020-01-31: qty 10
  Filled 2020-01-31: qty 1
  Filled 2020-01-31: qty 10
  Filled 2020-01-31: qty 1
  Filled 2020-01-31 (×3): qty 10

## 2020-01-31 MED ORDER — CHLORHEXIDINE GLUCONATE CLOTH 2 % EX PADS
6.0000 | MEDICATED_PAD | Freq: Every day | CUTANEOUS | Status: DC
  Administered 2020-01-31: 6 via TOPICAL

## 2020-01-31 MED ORDER — DEXTROSE IN LACTATED RINGERS 5 % IV SOLN: INTRAVENOUS | Status: DC

## 2020-01-31 NOTE — Progress Notes (Addendum)
PROGRESS NOTE    Debra [name]  YPP:[id number] DOB: [birth year] DOA: 01/29/2020 PCP: [name], MD   Chief complaint.  Nausea vomiting.  Brief Narrative:  Patient is a 54 year old female with history of hypothyroidism, iron deficient anemia, recent Covid infection in March.  Patient was also recently diagnosed with sigmoid mass, scheduled for surgery.  She came to the hospital complaining nausea vomiting after bowel prep.  She was brought to surgery 5/24 for total colectomy.  Surgical pathology still pending.     Assessment & Plan:   Principal Problem:   Bowel obstruction (Uncertain) Active Problems:   COVID-19 virus detected   Hypothyroidism   Colonic mass   Iron deficiency anemia due to chronic blood loss  #1.  Colon mass.  Presumed to be colorectal cancer. Status post colectomy.  Patient bowel function has recovered, she had multiple stools today.  She is on a diet.  We will continue to follow.  2.  Persistent positive Covid. Was notified by the nurse, who has been communicating with infectious control.  They do not believe patient is contagious at this point.  3.  Hypothyroidism. Continue supplement.  4.  Hematuria. Check a UA and urine culture.  With increased leukocytosis, I will cover with Rocephin.   DVT prophylaxis: Lovenox Code Status: Full Family Communication:None Disposition Plan:   Patient came from:      Home                                                                                                           Anticipated d/c place: Home  Barriers to d/c OR conditions which need to be met to effect a safe d/c:   Consultants:  General surgery.  Procedures: Colectomy Antimicrobials: Rocephin  Subjective: Patient seen and examined with assist from professional [place] interpreter. Currently doing well, she had multiple bowel movement this morning.  She is tolerating diet, without nausea vomiting. Patient has been complaining some  hematuria without dysuria. No short of breath or cough. No fever or chills.  Objective: Vitals:   01/31/20 0814 01/31/20 0815 01/31/20 0816 01/31/20 1217  BP: 91/61 (!) 94/59 (!) 94/59 (!) 98/53  Pulse: 69 69 68 72  Resp: 14   14  Temp: 98.2 F (36.8 C)   (!) 97.5 F (36.4 C)  TempSrc: Oral   Oral  SpO2: 94% 94% 94% 94%  Weight:      Height:        Intake/Output Summary (Last 24 hours) at 01/31/2020 1344 Last data filed at 01/31/2020 0913 Gross per 24 hour  Intake 1957.16 ml  Output 975 ml  Net 982.16 ml   Filed Weights   01/29/20 1956 01/30/20 0637  Weight: 86.2 kg 86 kg    Examination:  General exam: Appears calm and comfortable  Respiratory system: Clear to auscultation. Respiratory effort normal. Cardiovascular system: S1 & S2 heard, RRR. No JVD, murmurs, rubs, gallops or clicks. No pedal edema. Gastrointestinal system: Abdomen is nondistended, soft and mildly tender. No organomegaly or masses felt. Normal bowel sounds heard.  Central nervous system: Alert and oriented. No focal neurological deficits. Extremities: Symmetric  Skin: No rashes, lesions or ulcers Psychiatry: Judgement and insight appear normal. Mood & affect appropriate.     Data Reviewed: I have personally reviewed following labs and imaging studies  CBC: Recent Labs  Lab 01/29/20 2008 01/30/20 1459 01/31/20 0452  WBC 6.0 14.9* 17.5*  HGB 12.5 13.9 11.7*  HCT 37.8 41.1 33.8*  MCV 82.4 80.9 79.9*  PLT 224 211 144   Basic Metabolic Panel: Recent Labs  Lab 01/29/20 2008 01/30/20 1459 01/31/20 0452  NA 139  --  137  K 3.7  --  4.3  CL 104  --  107  CO2 25  --  24  GLUCOSE 115*  --  194*  BUN 8  --  13  CREATININE 0.46 0.57 0.43*  CALCIUM 8.8*  --  8.8*  MG  --   --  1.9   GFR: Estimated Creatinine Clearance: 77.4 mL/min (A) (by C-G formula based on SCr of 0.43 mg/dL (L)). Liver Function Tests: Recent Labs  Lab 01/29/20 2008  AST 24  ALT 15  ALKPHOS 71  BILITOT 0.6  PROT  8.0  ALBUMIN 4.3   Recent Labs  Lab 01/29/20 2008  LIPASE 25   No results for input(s): AMMONIA in the last 168 hours. Coagulation Profile: No results for input(s): INR, PROTIME in the last 168 hours. Cardiac Enzymes: No results for input(s): CKTOTAL, CKMB, CKMBINDEX, TROPONINI in the last 168 hours. BNP (last 3 results) No results for input(s): PROBNP in the last 8760 hours. HbA1C: No results for input(s): HGBA1C in the last 72 hours. CBG: No results for input(s): GLUCAP in the last 168 hours. Lipid Profile: No results for input(s): CHOL, HDL, LDLCALC, TRIG, CHOLHDL, LDLDIRECT in the last 72 hours. Thyroid Function Tests: No results for input(s): TSH, T4TOTAL, FREET4, T3FREE, THYROIDAB in the last 72 hours. Anemia Panel: No results for input(s): VITAMINB12, FOLATE, FERRITIN, TIBC, IRON, RETICCTPCT in the last 72 hours. Sepsis Labs: No results for input(s): PROCALCITON, LATICACIDVEN in the last 168 hours.  Recent Results (from the past 240 hour(s))  SARS Coronavirus 2 by RT PCR (hospital order, performed in [facility] hospital lab) Nasopharyngeal Urine, Clean Catch     Status: Abnormal   Collection Time: 01/29/20 11:02 PM   Specimen: Urine, Clean Catch; Nasopharyngeal  Result Value Ref Range Status   SARS Coronavirus 2 POSITIVE (A) NEGATIVE Final    Comment: RESULT CALLED TO, READ BACK BY AND VERIFIED WITH: NOEL WEBSTER 01/30/20 AT 0034 HS Performed at [facility], [address] [place], [place] [address]          Radiology Studies: CT ABDOMEN PELVIS W CONTRAST  Result Date: 01/29/2020 CLINICAL DATA:  Generalized abdominal pain, vomiting, sigmoid mass suspicious for malignancy EXAM: CT ABDOMEN AND PELVIS WITH CONTRAST TECHNIQUE: Multidetector CT imaging of the abdomen and pelvis was performed using the standard protocol following bolus administration of intravenous contrast. CONTRAST:  145m OMNIPAQUE IOHEXOL 300 MG/ML  SOLN COMPARISON:  01/03/2020  FINDINGS: Lower chest: No acute pleural or parenchymal lung disease. Hepatobiliary: No focal liver abnormality is seen. No gallstones, gallbladder wall thickening, or biliary dilatation. Pancreas: Unremarkable. No pancreatic ductal dilatation or surrounding inflammatory changes. Spleen: Normal in size without focal abnormality. Adrenals/Urinary Tract: Adrenal glands are unremarkable. Kidneys are normal, without renal calculi, focal lesion, or hydronephrosis. Bladder is unremarkable. Stomach/Bowel: Segmental circumferential wall thickening within the proximal sigmoid colon is again seen, measuring 5 cm  in length, concerning for malignancy. There is significant proximal colonic distention with large amount of retained stool. There may be an element of obstruction related to the sigmoid mass. Normal appendix right lower quadrant. Vascular/Lymphatic: No significant vascular findings are present. No enlarged abdominal or pelvic lymph nodes. Reproductive: Uterus and bilateral adnexa are unremarkable. Other: No abdominal wall hernia or abnormality. No abdominopelvic ascites. Musculoskeletal: No acute or destructive bony lesions. Reconstructed images demonstrate no additional findings. IMPRESSION: 1. Circumferential wall thickening proximal sigmoid colon concerning for malignancy. No change since prior study. 2. Significant fecal retention and colonic distention proximal to the sigmoid mass, consistent with an element of colonic obstruction. Electronically Signed   By: Randa Ngo M.D.   On: 01/29/2020 20:42   DG C-Arm 1-60 Min-No Report  Result Date: 01/30/2020 Fluoroscopy was utilized by the requesting physician.  No radiographic interpretation.        Scheduled Meds: . acetaminophen  1,000 mg Oral Q6H  . alvimopan  12 mg Oral BID  . celecoxib  200 mg Oral BID  . enoxaparin (LOVENOX) injection  40 mg Subcutaneous Q24H  . gabapentin  300 mg Oral BID  . levothyroxine  137 mcg Oral QAC breakfast  .  scopolamine  1 patch Transdermal Q72H   Continuous Infusions: . dextrose 5% lactated ringers 100 mL/hr at 01/31/20 1221     LOS: 2 days    Time spent: 25 min    Sharen Hones, MD [facility]   To contact the attending provider between 7A-7P or the covering provider during after hours 7P-7A, please log into the web site [email or web address] and access using universal [place] password for that web site. If you do not have the password, please call the hospital operator.  01/31/2020, 1:44 PM

## 2020-01-31 NOTE — Progress Notes (Addendum)
[place] Hospital Day(s): 2.   Post op day(s): 1 Day Post-Op.   Interval History:  Patient seen and examined no acute events or new complaints overnight.  Patient reports she is feeling okay, abdominal soreness No nausea or emesiz Leukocytosis to 17.5K; no fevers, likely reactive from surgery Hemoglobin 11.9, likely dilutional, no concerns for bleeding currently  Renal function is normal, sCr - 0.43, UO - 825 ccs No electrolyte derangement She has been NPO post-op There are multiple BMs recorded --> suspect post-obstructive diarrhea   Vital signs in last 24 hours: [min-max] current  Temp:  [97.7 F (36.5 C)-98.2 F (36.8 C)] 98 F (36.7 C) (05/25 0511) Pulse Rate:  [76-104] 86 (05/25 0511) Resp:  [16-22] 20 (05/25 0511) BP: (103-139)/(70-87) 109/74 (05/25 0511) SpO2:  [94 %-100 %] 99 % (05/25 0511)     Height: 5' (152.4 cm) Weight: 86 kg BMI (Calculated): 37.03   Intake/Output last 2 shifts:  05/24 0701 - 05/25 0700 In: 4557.2 [I.V.:4457.2; IV Piggyback:100] Out: 1225 [Urine:825; Blood:400]   Physical Exam:  Constitutional: alert, cooperative and no distress  Respiratory: breathing non-labored at rest  Cardiovascular: regular rate and sinus rhythm  Gastrointestinal: Soft, incisional soreness, and non-distended, no rebound/guarding Genitourinary: Foley in place (will remove) Integumentary: Laparotomy incision is CDI staples and dressing, minimal drainage, no erythema to visible surrounding skin  Labs:  CBC Latest Ref Rng & Units 01/31/2020 01/30/2020 01/29/2020  WBC 4.0 - 10.5 K/uL 17.5(H) 14.9(H) 6.0  Hemoglobin 12.0 - 15.0 g/dL 11.7(L) 13.9 12.5  Hematocrit 36.0 - 46.0 % 33.8(L) 41.1 37.8  Platelets 150 - 400 K/uL 190 211 224   CMP Latest Ref Rng & Units 01/31/2020 01/30/2020 01/29/2020  Glucose 70 - 99 mg/dL 194(H) - 115(H)  BUN 6 - 20 mg/dL 13 - 8  Creatinine 0.44 - 1.00 mg/dL 0.43(L) 0.57 0.46  Sodium 135 - 145 mmol/L 137  - 139  Potassium 3.5 - 5.1 mmol/L 4.3 - 3.7  Chloride 98 - 111 mmol/L 107 - 104  CO2 22 - 32 mmol/L 24 - 25  Calcium 8.9 - 10.3 mg/dL 8.8(L) - 8.8(L)  Total Protein 6.5 - 8.1 g/dL - - 8.0  Total Bilirubin 0.3 - 1.2 mg/dL - - 0.6  Alkaline Phos 38 - 126 U/L - - 71  AST 15 - 41 U/L - - 24  ALT 0 - 44 U/L - - 15     Imaging studies: No new pertinent imaging studies   Assessment/Plan:  54 y.o. female overall doing wel 1 Day Post-Op s/p open left hemicolectomy with mobilization of the splenic flexure for obstructing sigmoid colon mass (likely malignant).   - Initiate CLD  - Continue IVF; wean as diet advances   - pain control prn; antiemetics prn  - monitor abdominal examination; on-going bowel function  - discontinue foley catheter  - Continue Entereg  - Mobilization encouraged; engage PT    - Further management per primary team; we will follow    All of the above findings and recommendations were discussed with the patient, and the medical team, and all of patient's questions were answered to her expressed satisfaction.  -- [name], PA-C [facility] 01/31/2020, 7:42 AM [phone] M-F: 7am - 4pm   I saw and evaluated the patient.  I agree with the above documentation, exam, and plan, which I have edited where appropriate. [name]  10:08 AM

## 2020-01-31 NOTE — Evaluation (Signed)
Physical Therapy Evaluation Patient Details Name: Debra [name] MRN: [id number] DOB: April 02, 1966 Today's Date: 01/31/2020   History of Present Illness  54 year old female with history of hypothyroidism, iron deficient anemia, recent Covid infection in March.  Patient was also recently diagnosed with sigmoid mass, s/p surgery 5/24.  Clinical Impression  Pt did well with PT exam, she was able to circumambulate the nurses' station and though she was slow and guarded she had no LOBs or overt safety issues.  She showed good effort with the session and feels good about being able to return home, endorses that she is weaker and more guarded than baseline and may be interested in HHPT, though hoping to do well enough to return home w/o the need.     Follow Up Recommendations Home health PT;Supervision - Intermittent    Equipment Recommendations  None recommended by PT    Recommendations for Other Services       Precautions / Restrictions Precautions Precautions: Fall Restrictions Weight Bearing Restrictions: No      Mobility  Bed Mobility Overal bed mobility: Modified Independent             General bed mobility comments: slow but independent transition from supine to sitting  Transfers Overall transfer level: Modified independent Equipment used: None             General transfer comment: Pt able to rise and control descent multiple times durings session, good confidence.   Ambulation/Gait Ambulation/Gait assistance: Supervision Gait Distance (Feet): 200 Feet Assistive device: None       General Gait Details: Slow, but consistent and safet circumambulation of the nurses' station.  Vitals remained stable t/o the effort (some difficulty reading some of the time).  Pt reports that she is slower and more limited than her baseline, but not too far off.  Stairs            Wheelchair Mobility    Modified Rankin (Stroke Patients Only)       Balance  Overall balance assessment: Modified Independent                                           Pertinent Vitals/Pain Pain Assessment: (endorses moderate abdominal pain)    Home Living Family/patient expects to be discharged to:: Private residence Living Arrangements: Children;Spouse/significant other Available Help at Discharge: Family(brother lives nearby and can help as needed)   Home Access: Stairs to enter Entrance Stairs-Rails: None Technical brewer of Steps: 2 Home Layout: One level Home Equipment: None      Prior Function Level of Independence: Independent         Comments: pt was working until her Covid diagnosis 2 months ago     Hand Dominance        Extremity/Trunk Assessment   Upper Extremity Assessment Upper Extremity Assessment: Overall WFL for tasks assessed    Lower Extremity Assessment Lower Extremity Assessment: Overall WFL for tasks assessed       Communication   Communication: Interpreter utilized;Prefers language other than English(Spanish speaking: interp Grayland Ormond [phone])  Cognition Arousal/Alertness: Awake/alert Behavior During Therapy: WFL for tasks assessed/performed Overall Cognitive Status: Within Functional Limits for tasks assessed  General Comments      Exercises     Assessment/Plan    PT Assessment Patient needs continued PT services  PT Problem List Decreased strength;Decreased range of motion;Decreased activity tolerance;Decreased balance;Decreased mobility;Decreased coordination;Decreased knowledge of use of DME;Decreased safety awareness       PT Treatment Interventions Gait training;Stair training;Functional mobility training;Therapeutic activities;Therapeutic exercise;Balance training;Neuromuscular re-education;Patient/family education    PT Goals (Current goals can be found in the Care Plan section)  Acute Rehab PT Goals Patient Stated Goal:  Go home ASAP PT Goal Formulation: With patient Time For Goal Achievement: 02/14/20 Potential to Achieve Goals: Good    Frequency Min 2X/week   Barriers to discharge        Co-evaluation               AM-PAC PT "6 Clicks" Mobility  Outcome Measure Help needed turning from your back to your side while in a flat bed without using bedrails?: None Help needed moving from lying on your back to sitting on the side of a flat bed without using bedrails?: None Help needed moving to and from a bed to a chair (including a wheelchair)?: None Help needed standing up from a chair using your arms (e.g., wheelchair or bedside chair)?: None Help needed to walk in hospital room?: A Little Help needed climbing 3-5 steps with a railing? : A Little 6 Click Score: 22    End of Session Equipment Utilized During Treatment: Gait belt Activity Tolerance: Patient tolerated treatment well Patient left: with call bell/phone within reach;with chair alarm set Nurse Communication: Mobility status PT Visit Diagnosis: Muscle weakness (generalized) (M62.81);Difficulty in walking, not elsewhere classified (R26.2)    Time: 1440-1510 PT Time Calculation (min) (ACUTE ONLY): 30 min   Charges:   PT Evaluation $PT Eval Low Complexity: 1 Low PT Treatments $Gait Training: 8-22 mins        Kreg Shropshire, DPT 01/31/2020, 3:33 PM

## 2020-02-01 LAB — CBC
HCT: 30.7 % — ABNORMAL LOW (ref 36.0–46.0)
Hemoglobin: 9.9 g/dL — ABNORMAL LOW (ref 12.0–15.0)
MCH: 27.7 pg (ref 26.0–34.0)
MCHC: 32.2 g/dL (ref 30.0–36.0)
MCV: 85.8 fL (ref 80.0–100.0)
Platelets: 160 10*3/uL (ref 150–400)
RBC: 3.58 MIL/uL — ABNORMAL LOW (ref 3.87–5.11)
RDW: 18 % — ABNORMAL HIGH (ref 11.5–15.5)
WBC: 10.9 10*3/uL — ABNORMAL HIGH (ref 4.0–10.5)
nRBC: 0 % (ref 0.0–0.2)

## 2020-02-01 LAB — BASIC METABOLIC PANEL
Anion gap: 4 — ABNORMAL LOW (ref 5–15)
BUN: 10 mg/dL (ref 6–20)
CO2: 26 mmol/L (ref 22–32)
Calcium: 8.7 mg/dL — ABNORMAL LOW (ref 8.9–10.3)
Chloride: 109 mmol/L (ref 98–111)
Creatinine, Ser: 0.54 mg/dL (ref 0.44–1.00)
GFR calc Af Amer: >60 mL/min (ref >60)
GFR calc non Af Amer: >60 mL/min (ref >60)
Glucose, Bld: 119 mg/dL — ABNORMAL HIGH (ref 70–99)
Potassium: 4.1 mmol/L (ref 3.5–5.1)
Sodium: 139 mmol/L (ref 135–145)

## 2020-02-01 LAB — MAGNESIUM: Magnesium: 2 mg/dL (ref 1.7–2.4)

## 2020-02-01 MED ORDER — LACTATED RINGERS IV SOLN: INTRAVENOUS | Status: DC

## 2020-02-01 NOTE — Progress Notes (Signed)
[place] Hospital Day(s): 3.   Post op day(s): 2 Days Post-Op.   Interval History:  Patient seen and examined No acute events or new complaints overnight.  Patient reports she is feeling good this morning Abdominal soreness at incision, really only notices it with ambulation or moving around, pain medications helping No fever, chills, nausea, or emesis Leukocytosis nearly resolved, now 10.9K (was 17K); no fevers  Renal function is normal, sCr - 0.54, good UO  No electrolyte derangement  She has been tolerating CLD, multiple BMs recorded and she reports these are small and more formed, + flatus Working with therapies, recommending HHPT  Vital signs in last 24 hours: [min-max] current  Temp:  [97.5 F (36.4 C)-98.2 F (36.8 C)] 97.8 F (36.6 C) (05/26 0426) Pulse Rate:  [57-72] 57 (05/26 0426) Resp:  [14-18] 16 (05/26 0426) BP: (85-122)/(52-69) 85/52 (05/26 0426) SpO2:  [93 %-100 %] 93 % (05/26 0426)     Height: 5' (152.4 cm) Weight: 86 kg BMI (Calculated): 37.03   Intake/Output last 2 shifts:  05/25 0701 - 05/26 0700 In: 2482.4 [P.O.:700; I.V.:1682.5; IV Piggyback:99.9] Out: 3050 [Urine:3050]   Physical Exam:  Constitutional: alert, cooperative and no distress  Respiratory: breathing non-labored at rest  Cardiovascular: regular rate and sinus rhythm  Gastrointestinal: Soft, incisional soreness, and non-distended, no rebound/guarding Integumentary: Laparotomy incision is CDI staples and dressing, minimal drainage, no erythema to visible surrounding skin, leave dressing 1 more day  Labs:  CBC Latest Ref Rng & Units 02/01/2020 01/31/2020 01/30/2020  WBC 4.0 - 10.5 K/uL 10.9(H) 17.5(H) 14.9(H)  Hemoglobin 12.0 - 15.0 g/dL 9.9(L) 11.7(L) 13.9  Hematocrit 36.0 - 46.0 % 30.7(L) 33.8(L) 41.1  Platelets 150 - 400 K/uL 160 190 211   CMP Latest Ref Rng & Units 02/01/2020 01/31/2020 01/30/2020  Glucose 70 - 99 mg/dL 119(H) 194(H) -  BUN 6 -  20 mg/dL 10 13 -  Creatinine 0.44 - 1.00 mg/dL 0.54 0.43(L) 0.57  Sodium 135 - 145 mmol/L 139 137 -  Potassium 3.5 - 5.1 mmol/L 4.1 4.3 -  Chloride 98 - 111 mmol/L 109 107 -  CO2 22 - 32 mmol/L 26 24 -  Calcium 8.9 - 10.3 mg/dL 8.7(L) 8.8(L) -  Total Protein 6.5 - 8.1 g/dL - - -  Total Bilirubin 0.3 - 1.2 mg/dL - - -  Alkaline Phos 38 - 126 U/L - - -  AST 15 - 41 U/L - - -  ALT 0 - 44 U/L - - -     Imaging studies: No new pertinent imaging studies   Assessment/Plan:  54 y.o. female overall doing well 2 Days Post-Op s/p open left hemicolectomy with mobilization of the splenic flexure for obstructing sigmoid colon mass (likely malignant).   - Will advance to full liquids this morning, can consider soft diet for dinner if doing really well, no rush             - Continue IVF; switch to LR (without D% do to hyperglycemia and diet advancement) given hypotension              - pain control prn; antiemetics prn             - monitor abdominal examination; on-going bowel function             - Discontinue Entereg today; + bowel function  - Mobilization encouraged; PT onboard; recommending HHPT             -  Further management per primary team; we will follow    All of the above findings and recommendations were discussed with the patient, and the medical team, and all of patient's questions were answered to her expressed satisfaction.  -- [name], PA-C [facility] 02/01/2020, 7:22 AM [phone] M-F: 7am - 4pm

## 2020-02-01 NOTE — Progress Notes (Signed)
RN notified MD surgery PA pt's BP 88/48. No new orders given. RN will continue to assess and monitor pt.

## 2020-02-01 NOTE — Progress Notes (Signed)
PROGRESS NOTE    Debra [name]  [id number] DOB: [birth year] DOA: 01/29/2020 PCP: [name], MD    Brief Narrative:  Patient is a 54 year old female with history of hypothyroidism, iron deficient anemia, recent Covid infection in March. Patient was also recently diagnosed with sigmoid mass, scheduled for surgery. She came to the hospital complaining nausea vomiting after bowel prep. She was brought to surgery 5/66for total colectomy.  Surgical pathology still pending. Hospitalist is Optometrist    Consultants:   Surgery  Procedures: Status post colectomy  Antimicrobials:   Ceftriaxone   Subjective: Patient reports having abdominal pain with ambulation. Had a bowel movement today a small one. No other complaints.  Objective: Vitals:   02/01/20 0426 02/01/20 0740 02/01/20 0800 02/01/20 1400  BP: (!) 85/52 (!) 88/38 (!) 88/46 (!) 101/55  Pulse: (!) 57 (!) 57  62  Resp: 16 15    Temp: 97.8 F (36.6 C) 97.7 F (36.5 C)  97.6 F (36.4 C)  TempSrc: Oral Oral  Oral  SpO2: 93% 91%    Weight:      Height:        Intake/Output Summary (Last 24 hours) at 02/01/2020 1515 Last data filed at 02/01/2020 1513 Gross per 24 hour  Intake 1689.23 ml  Output 4300 ml  Net -2610.77 ml   Filed Weights   01/29/20 1956 01/30/20 0637  Weight: 86.2 kg 86 kg    Examination:  General exam: Appears calm and comfortable  Respiratory system: Clear to auscultation. Respiratory effort normal. Cardiovascular system: S1 & S2 heard, RRR. No JVD, murmurs, rubs, gallops or clicks.  Gastrointestinal system: Abdomen is distended, soft and nontender. Mid dressing in place. decreased bowel sounds heard. Central nervous system: Alert and oriented.Grossly intact Extremities: No edema Skin: Warm dry Psychiatry:  Mood & affect appropriate in current setting.     Data Reviewed: I have personally reviewed following labs and imaging studies  CBC: Recent Labs  Lab 01/29/20 2008  01/30/20 1459 01/31/20 0452 02/01/20 0526  WBC 6.0 14.9* 17.5* 10.9*  HGB 12.5 13.9 11.7* 9.9*  HCT 37.8 41.1 33.8* 30.7*  MCV 82.4 80.9 79.9* 85.8  PLT 224 211 190 0000000   Basic Metabolic Panel: Recent Labs  Lab 01/29/20 2008 01/30/20 1459 01/31/20 0452 02/01/20 0526  NA 139  --  137 139  K 3.7  --  4.3 4.1  CL 104  --  107 109  CO2 25  --  24 26  GLUCOSE 115*  --  194* 119*  BUN 8  --  13 10  CREATININE 0.46 0.57 0.43* 0.54  CALCIUM 8.8*  --  8.8* 8.7*  MG  --   --  1.9 2.0   GFR: Estimated Creatinine Clearance: 77.4 mL/min (by C-G formula based on SCr of 0.54 mg/dL). Liver Function Tests: Recent Labs  Lab 01/29/20 2008  AST 24  ALT 15  ALKPHOS 71  BILITOT 0.6  PROT 8.0  ALBUMIN 4.3   Recent Labs  Lab 01/29/20 2008  LIPASE 25   No results for input(s): AMMONIA in the last 168 hours. Coagulation Profile: No results for input(s): INR, PROTIME in the last 168 hours. Cardiac Enzymes: No results for input(s): CKTOTAL, CKMB, CKMBINDEX, TROPONINI in the last 168 hours. BNP (last 3 results) No results for input(s): PROBNP in the last 8760 hours. HbA1C: No results for input(s): HGBA1C in the last 72 hours. CBG: No results for input(s): GLUCAP in the last 168 hours. Lipid Profile: No results for  input(s): CHOL, HDL, LDLCALC, TRIG, CHOLHDL, LDLDIRECT in the last 72 hours. Thyroid Function Tests: No results for input(s): TSH, T4TOTAL, FREET4, T3FREE, THYROIDAB in the last 72 hours. Anemia Panel: No results for input(s): VITAMINB12, FOLATE, FERRITIN, TIBC, IRON, RETICCTPCT in the last 72 hours. Sepsis Labs: No results for input(s): PROCALCITON, LATICACIDVEN in the last 168 hours.  Recent Results (from the past 240 hour(s))  SARS Coronavirus 2 by RT PCR (hospital order, performed in [facility] hospital lab) Nasopharyngeal Urine, Clean Catch     Status: Abnormal   Collection Time: 01/29/20 11:02 PM   Specimen: Urine, Clean Catch; Nasopharyngeal  Result Value Ref  Range Status   SARS Coronavirus 2 POSITIVE (A) NEGATIVE Final    Comment: RESULT CALLED TO, READ BACK BY AND VERIFIED WITH: NOEL WEBSTER 01/30/20 AT 0034 HS Performed at [facility], [address], [place], [place] [address]          Radiology Studies: No results found.      Scheduled Meds: . acetaminophen  1,000 mg Oral Q6H  . celecoxib  200 mg Oral BID  . enoxaparin (LOVENOX) injection  40 mg Subcutaneous Q24H  . gabapentin  300 mg Oral BID  . levothyroxine  137 mcg Oral QAC breakfast  . scopolamine  1 patch Transdermal Q72H   Continuous Infusions: . cefTRIAXone (ROCEPHIN)  IV Stopped (01/31/20 1608)  . lactated ringers 50 mL/hr at 02/01/20 1503    Assessment & Plan:   Principal Problem:   Bowel obstruction ([place]) Active Problems:   COVID-19 virus detected   Hypothyroidism   Colonic mass   Iron deficiency anemia due to chronic blood loss  #1.  Colon mass.  Presumed to be colorectal cancer. Status post colectomy. She is on a diet,advance as tolerated Surgery following  2.  Persistent positive Covid. per nursing, who has been communicating with infectious control.  They do not believe patient is contagious at this point.  3.  Hypothyroidism. Continue supplement.  4.  Hematuria. Check a UA and urine culture.  With increased leukocytosis,started on Rocephin. Wbc improving.afebrile. f/u ucx. Can d/c with po abx if needed.   DVT prophylaxis:Lovenox Code Status:Full  Medicine will sign off, discussed with Dr. Celine Ahr.    LOS: 3 days   Time spent:45 min with >50% on coc    Nolberto Hanlon, MD [facility] Pager [phone]  If 7PM-7AM, please contact night-coverage [email or web address] Password [facility] 02/01/2020, 3:15 PM

## 2020-02-01 NOTE — Progress Notes (Addendum)
Physical Therapy Treatment Patient Details Name: Debra [name] MRN: [id number] DOB: Feb 02, 1966 Today's Date: 02/01/2020    History of Present Illness 54 year old female with history of hypothyroidism, iron deficient anemia, recent Covid infection in March.  Patient was also recently diagnosed with sigmoid mass, s/p surgery 5/24.    PT Comments    [id number] [place] interpreter used for session.  In recliner with no c/o.  Ready for gait.  Participated in exercises as described below.  Stood and is able to walk unit with supervision.  She continues with slow short steps.  Given RW to trial and she initially stated she felt better with it but when asked later she did not want one for home.  1/2 unit with walker, 1/2 without.  Stair training completed x 3 steps with 1 rail and generally steady gait.  No further questions regarding mobility or discharge plan.  BP noted to be soft this am.  No c/o dizziness etc with mobility.   Follow Up Recommendations  Home health PT;Supervision - Intermittent     Equipment Recommendations  None recommended by PT    Recommendations for Other Services       Precautions / Restrictions Precautions Precautions: Fall Restrictions Weight Bearing Restrictions: No    Mobility  Bed Mobility Overal bed mobility: Needs Assistance Bed Mobility: Sit to Supine       Sit to supine: Supervision      Transfers Overall transfer level: Modified independent Equipment used: None                Ambulation/Gait Ambulation/Gait assistance: Supervision Gait Distance (Feet): 200 Feet Assistive device: None;Rolling walker (2 wheeled) Gait Pattern/deviations: Step-through pattern;Decreased step length - right;Decreased step length - left Gait velocity: decreased   General Gait Details: gait with and without walker.  she initially stated she felt better with RW but when asked later in session stated she did not want one and felt  ok.   Stairs Stairs: Yes Stairs assistance: Min guard Stair Management: One rail Right;Forwards Number of Stairs: 3 General stair comments: does well and stated she feels comfortable   Wheelchair Mobility    Modified Rankin (Stroke Patients Only)       Balance Overall balance assessment: Modified Independent                                          Cognition Arousal/Alertness: Awake/alert Behavior During Therapy: WFL for tasks assessed/performed Overall Cognitive Status: Within Functional Limits for tasks assessed                                        Exercises Other Exercises Other Exercises: seated AROM for ankle pumps, LAQ and marches x 10    General Comments        Pertinent Vitals/Pain Pain Assessment: No/denies pain    Home Living                      Prior Function            PT Goals (current goals can now be found in the care plan section) Progress towards PT goals: Progressing toward goals    Frequency    Min 2X/week      PT Plan Current plan remains appropriate  Co-evaluation              AM-PAC PT "6 Clicks" Mobility   Outcome Measure  Help needed turning from your back to your side while in a flat bed without using bedrails?: None Help needed moving from lying on your back to sitting on the side of a flat bed without using bedrails?: None Help needed moving to and from a bed to a chair (including a wheelchair)?: None Help needed standing up from a chair using your arms (e.g., wheelchair or bedside chair)?: None Help needed to walk in hospital room?: A Little Help needed climbing 3-5 steps with a railing? : A Little 6 Click Score: 22    End of Session Equipment Utilized During Treatment: Gait belt Activity Tolerance: Patient tolerated treatment well Patient left: in bed;with bed alarm set;with call bell/phone within reach Nurse Communication: Mobility status       Time:  [id number] PT Time Calculation (min) (ACUTE ONLY): 23 min  Charges:  $Gait Training: 8-22 mins $Therapeutic Exercise: 8-22 mins                    Chesley Noon, PTA 02/01/20, 11:49 AM

## 2020-02-02 LAB — GLUCOSE, CAPILLARY
Glucose-Capillary: 78 mg/dL (ref 70–99)
Glucose-Capillary: 93 mg/dL (ref 70–99)

## 2020-02-02 LAB — URINE CULTURE: Culture: NO GROWTH

## 2020-02-02 MED ORDER — ENSURE ENLIVE PO LIQD
237.0000 mL | Freq: Two times a day (BID) | ORAL | Status: DC
  Administered 2020-02-02 (×2): 237 mL via ORAL

## 2020-02-02 NOTE — Progress Notes (Signed)
[facility] Hospital Day(s): 4.   Post op day(s): 3 Days Post-Op.   Interval History:  Patient seen and examined She did have issues with hypoglycemia (73) overnight which improved with orange juice.  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   Vital signs in last 24 hours: [min-max] current  Temp:  [97.6 F (36.4 C)-99.9 F (37.7 C)] 99.9 F (37.7 C) (05/27 0530) Pulse Rate:  [57-81] 81 (05/27 0530) Resp:  [15-20] 20 (05/27 0530) BP: (88-111)/(38-56) 93/53 (05/27 0530) SpO2:  [73 %-98 %] 95 % (05/27 0530)     Height: 5' (152.4 cm) Weight: 86 kg BMI (Calculated): 37.03   Intake/Output last 2 shifts:  05/26 0701 - 05/27 0700 In: 2007.3 [P.O.:960; I.V.:947.3; IV Piggyback:100] Out: 3100 [Urine:3100]   Physical Exam:  Constitutional: alert, cooperative and no distress  Respiratory: breathing non-labored at rest  Cardiovascular: regular rate and sinus rhythm  Gastrointestinal:Soft,incisional soreness, and non-distended, no rebound/guarding Integumentary:Laparotomy incision is CDIstaples, no erythema, no drainage   Labs:  CBC Latest Ref Rng & Units 02/01/2020 01/31/2020 01/30/2020  WBC 4.0 - 10.5 K/uL 10.9(H) 17.5(H) 14.9(H)  Hemoglobin 12.0 - 15.0 g/dL 9.9(L) 11.7(L) 13.9  Hematocrit 36.0 - 46.0 % 30.7(L) 33.8(L) 41.1  Platelets 150 - 400 K/uL 160 190 211   CMP Latest Ref Rng & Units 02/01/2020 01/31/2020 01/30/2020  Glucose 70 - 99 mg/dL 119(H) 194(H) -  BUN 6 - 20 mg/dL 10 13 -  Creatinine 0.44 - 1.00 mg/dL 0.54 0.43(L) 0.57  Sodium 135 - 145 mmol/L 139 137 -  Potassium 3.5 - 5.1  mmol/L 4.1 4.3 -  Chloride 98 - 111 mmol/L 109 107 -  CO2 22 - 32 mmol/L 26 24 -  Calcium 8.9 - 10.3 mg/dL 8.7(L) 8.8(L) -  Total Protein 6.5 - 8.1 g/dL - - -  Total Bilirubin 0.3 - 1.2 mg/dL - - -  Alkaline Phos 38 - 126 U/L - - -  AST 15 - 41 U/L - - -  ALT 0 - 44 U/L - - -     Imaging studies: No new pertinent imaging studies   Assessment/Plan:  54 y.o. female overall doing well 3 Days Post-Op s/p open left hemicolectomy with mobilization of the splenic flexurefor obstructing sigmoid colon mass (likely malignant).   - Advance to Soft diet  - Discontinue IVF  - Monitor sugars, low but normal  - pain control prn; antiemetics prn - monitor abdominal examination; on-going bowel function   - Mobilization encouraged; PTonboard; recommending HHPT --> ordered this today -Medical management of comorbid conditions   - Discharge Planning: Hopefully home in next 24-48 hours if continues to do well clinically and tolerates diet advancement.    All of the above findings and recommendations were discussed with the patient, and the medical team, and all of patient's questions were answered to her expressed satisfaction.  -- Debra Simon, PA-C [facility] 02/02/2020, 7:16 AM [phone] M-F: 7am - 4pm

## 2020-02-02 NOTE — Progress Notes (Signed)
Patient complained of feeling shakey, not feeling well; O2 sat 73% RA, CBG 74; OJ given and O2 applied at 2L [place] with improvement in O2 sat of 100%. Denies surgical pain at this time; Warm blanket given.  Stated that she was warming up, but still feeling shakey. Barbaraann Faster, RN 12:40 AM; 02/02/2020

## 2020-02-02 NOTE — TOC Initial Note (Signed)
Transition of Care [facility]) - Initial/Assessment Note    Patient Details  Name: Debra [name] MRN: [id number] Date of Birth: [birth year]  Transition of Care [facility]) CM/SW Contact:    Beverly Sessions, RN Phone Number: 02/02/2020, 2:57 PM  Clinical Narrative:                   Expected Discharge Plan: [place] Services Barriers to Discharge: Continued Medical Work up   Patient Goals and CMS Choice  Patient post op hemicolectomy.   Patient spanish speaking.  Assessment completed with spanish interpreter.   Patient states that she lives at home alone.  Prior to surgery was independent and able to drive.   PCP - Juanda Crumble drew - patient denies issues with transportation Patient also uses the pharmacy at [facility] and denies issues obtaining her medications  PT has assessed patient and recommends home health PT.  Patient in agreement and feels that it will be beneficial. Charity referral made to [facility] with [place]. Patient declines rolling walker for discharge.    RNCM confirmed patient's address and contact information.  Patient states that she has been out of work since March 10th, and has been living off of her savings.  Patient will need interpreter for home health.  This information has been provided to Santiago Glad at [facility]        Expected Discharge Plan and Services Expected Discharge Plan: [place]   Discharge Planning Services: CM Consult   Living arrangements for the past 2 months: [place]: PT [facility] Agency: [place] ([place]) Date [place]: 02/02/20   Representative spoke with at [place]: Santiago Glad  Prior Living Arrangements/Services Living arrangements for the past 2 months: Single Family Home Lives with:: Self Patient language and need for interpreter reviewed:: Yes Do you feel safe going back to the place where you live?: Yes       Need for Family Participation in Patient Care: Yes (Comment) Care giver support system in place?: Yes (comment)   Criminal Activity/Legal Involvement Pertinent to Current Situation/Hospitalization: No - Comment as needed  Activities of Daily Living Home Assistive Devices/Equipment: None ADL Screening (condition at time of admission) Patient's cognitive ability adequate to safely complete daily activities?: Yes Is the patient deaf or have difficulty hearing?: No Does the patient have difficulty seeing, even when wearing glasses/contacts?: No Does the patient have difficulty concentrating, remembering, or making decisions?: No Patient able to express need for assistance with ADLs?: Yes Does the patient have difficulty dressing or bathing?: No Independently performs ADLs?: Yes (appropriate for developmental age) Does the patient have difficulty walking or climbing stairs?: No Weakness of Legs: None Weakness of Arms/Hands: None  Permission Sought/Granted                  Emotional Assessment Appearance:: Appears stated age Attitude/Demeanor/Rapport: Gracious Affect (typically observed): Accepting Orientation: : Oriented to Self, Oriented to Place, Oriented to  Time, Oriented to Situation   Psych Involvement: No (comment)  Admission diagnosis:  Bowel obstruction ([place]) [K56.609] Large bowel obstruction ([place]) [id number] Patient Active Problem List   Diagnosis Date Noted  . Bowel obstruction ([place]) 01/29/2020  . Iron deficiency anemia due to chronic blood loss 11/22/2019  . Colonic mass   . COVID-19 virus  detected 11/20/2019  . Symptomatic anemia 11/20/2019  . Hypothyroidism 11/20/2019  . Iron deficiency anemia 11/20/2019  . GIB (gastrointestinal bleeding) 11/20/2019   PCP:  [name], MD Pharmacy:   [facility] [place], [place] [address] Phone: [phone] Fax: [phone]     Social  Determinants of Health (SDOH) Interventions    Readmission Risk Interventions No flowsheet data found.

## 2020-02-03 ENCOUNTER — Inpatient Hospital Stay: Payer: Self-pay

## 2020-02-03 ENCOUNTER — Telehealth: Payer: Self-pay

## 2020-02-03 DIAGNOSIS — L8990 Pressure ulcer of unspecified site, unspecified stage: Secondary | ICD-10-CM | POA: Diagnosis not present

## 2020-02-03 LAB — BASIC METABOLIC PANEL
Anion gap: 7 (ref 5–15)
BUN: 9 mg/dL (ref 6–20)
CO2: 23 mmol/L (ref 22–32)
Calcium: 8.3 mg/dL — ABNORMAL LOW (ref 8.9–10.3)
Chloride: 107 mmol/L (ref 98–111)
Creatinine, Ser: 0.53 mg/dL (ref 0.44–1.00)
GFR calc Af Amer: >60 mL/min (ref >60)
GFR calc non Af Amer: >60 mL/min (ref >60)
Glucose, Bld: 113 mg/dL — ABNORMAL HIGH (ref 70–99)
Potassium: 3.9 mmol/L (ref 3.5–5.1)
Sodium: 137 mmol/L (ref 135–145)

## 2020-02-03 LAB — CBC
HCT: 27.7 % — ABNORMAL LOW (ref 36.0–46.0)
Hemoglobin: 9.4 g/dL — ABNORMAL LOW (ref 12.0–15.0)
MCH: 28 pg (ref 26.0–34.0)
MCHC: 33.9 g/dL (ref 30.0–36.0)
MCV: 82.4 fL (ref 80.0–100.0)
Platelets: 199 10*3/uL (ref 150–400)
RBC: 3.36 MIL/uL — ABNORMAL LOW (ref 3.87–5.11)
RDW: 17.1 % — ABNORMAL HIGH (ref 11.5–15.5)
WBC: 8.4 10*3/uL (ref 4.0–10.5)
nRBC: 0 % (ref 0.0–0.2)

## 2020-02-03 MED ORDER — KCL IN DEXTROSE-NACL 20-5-0.45 MEQ/L-%-% IV SOLN
INTRAVENOUS | Status: DC
  Filled 2020-02-03 (×9): qty 1000

## 2020-02-03 NOTE — Progress Notes (Addendum)
daughter asked about staying overnight, emotional tough day today with the pathology results. I discussed this with AC, agreed to allow her to stay.

## 2020-02-03 NOTE — Telephone Encounter (Signed)
I wasn't aware that she had an appt on 6/2.  That date would be better, please change back to 6/2 MD only.  Thanks

## 2020-02-03 NOTE — Progress Notes (Addendum)
   02/03/20 1100  Clinical Encounter Type  Visited With Patient  Visit Type Initial  Referral From Nurse  Consult/Referral To Chaplain  Chaplain stopped by to visit with patient and realized she needs an interpreter. After founding out that she needs chemo, nurse asked patient if she would like to speak with a chaplain. Chaplain was notified. Chaplain will stay need waiting for interpreter.   Interpreter, Mr. [name] immediately came up and chaplain was called. Chaplain went in room with Mr. [name]. Through interpreter Chaplain asked if she could pray for her and she said yes. Chaplain prayed a short pray and told patient she will be here if she needs her. Patient said God has angels and she told chaplain that she is her angel. What words of inspiration and encouraging. Chaplain will check in on patient throughout the day.

## 2020-02-03 NOTE — Progress Notes (Addendum)
[facility] Hospital Day(s): 5.   Post op day(s): 4 Days Post-Op.   Interval History:  Patient seen and examined with assistance of [employer] interpreter, Rigoberto. Per NT, less energetic than has been the past couple of days. Had emesis last night and this morning; patient states she thinks she ate too much. Abdominal soreness with movement, improved No fevers or chills. Labs this AM with normal WBC, no significant abnormalities. 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 Tasia Catchings in Oncology.  Vital signs in last 24 hours: [min-max] current  Temp:  [98.4 F (36.9 C)-99 F (37.2 C)] 99 F (37.2 C) (05/28 0428) Pulse Rate:  [82-89] 82 (05/28 0428) Resp:  [16-20] 20 (05/28 0428) BP: (111-115)/(65-69) 111/69 (05/28 0428) SpO2:  [96 %-100 %] 96 % (05/28 0428)     Height: 5' (152.4 cm) Weight: 86 kg BMI (Calculated): 37.03   Intake/Output last 2 shifts:  05/27 0701 - 05/28 0700 In: 120 [P.O.:120] Out: 200 [Urine:200]   Physical Exam:  Constitutional: alert, cooperative and no distress  Respiratory: breathing non-labored at rest  Cardiovascular: regular rate and sinus rhythm  Gastrointestinal:Soft,incisional soreness, and non-distended, no rebound/guarding Integumentary:Laparotomy incision is CDIstaples, no erythema, no drainage; small abrasion on left buttock, covered with Mepilex dressing.   Labs:  CBC Latest Ref Rng & Units 02/03/2020 02/01/2020 01/31/2020  WBC 4.0 - 10.5 K/uL 8.4 10.9(H) 17.5(H)  Hemoglobin 12.0 - 15.0 g/dL 9.4(L) 9.9(L) 11.7(L)  Hematocrit 36.0 - 46.0 % 27.7(L) 30.7(L) 33.8(L)  Platelets 150 - 400 K/uL 199 160 190   CMP Latest Ref Rng & Units 02/03/2020 02/01/2020 01/31/2020  Glucose 70 - 99 mg/dL 113(H) 119(H) 194(H)  BUN 6 - 20 mg/dL 9 10 13   Creatinine 0.44 - 1.00  mg/dL 0.53 0.54 0.43(L)  Sodium 135 - 145 mmol/L 137 139 137  Potassium 3.5 - 5.1 mmol/L 3.9 4.1 4.3  Chloride 98 - 111 mmol/L 107 109 107  CO2 22 - 32 mmol/L 23 26 24   Calcium 8.9 - 10.3 mg/dL 8.3(L) 8.7(L) 8.8(L)  Total Protein 6.5 - 8.1 g/dL - - -  Total Bilirubin 0.3 - 1.2 mg/dL - - -  Alkaline Phos 38 - 126 U/L - - -  AST 15 - 41 U/L - - -  ALT 0 - 44 U/L - - -     Imaging studies: No new pertinent imaging studies   Assessment/Plan:  54 y.o. female overall doing well 4 Days Post-Op s/p open left hemicolectomy with mobilization of the splenic flexurefor obstructing sigmoid colon mass (likely malignant). With N/V overnight and this morning.   - Go back to clear liquid diet  - Restart IVF  - Monitor sugars, low but normal  - pain control prn; antiemetics prn - monitor abdominal examination; on-going bowel function; will get acute abdomen series; may require CT   - Mobilization encouraged; PTonboard; recommending HHPT --> ordered this today -Medical management of comorbid conditions   - Discharge Planning: Had hoped for d/c over the weekend, however clinically, she has had some regression, so will need to stay for now.  All of the above findings and recommendations were discussed with the patient, and the medical team, and all of patient's questions were answered to her expressed satisfaction.

## 2020-02-03 NOTE — Telephone Encounter (Signed)
Done.Marland Kitchen...  Pt 02/08/20 MD only appt has been R/S to 02/21/20 A 2:00.

## 2020-02-03 NOTE — Progress Notes (Signed)
PT Cancellation Note  Patient Details Name: Debra [name] MRN: [id number] DOB: December 16, 1965   Cancelled Treatment:     Pt was long sitting in bed resting with spouse at bedside. She reports still feeling very fatigued and requested therapist return tomorrow. Very politely refused. Will return next date to perform ambulation and stair training to simulate home entry.    Willette Pa 02/03/2020, 2:01 PM

## 2020-02-03 NOTE — Telephone Encounter (Signed)
-----   Message from Earlie Server, MD sent at 02/03/2020  8:40 AM EDT ----- Thank you. She will need adjuvant chemotherapy. I will schedule her to see me in 1-2 weeks and discuss. Will you put medi port if she agrees with chemotherapy?  Thank you  Z[place]  ----- Message ----- From: [name], MD Sent: 02/03/2020   7:39 AM EDT To: Earlie Server, MD  FYI.  She will likely be discharged over the weekend.  When would you like to see her in your clinic?

## 2020-02-03 NOTE — Progress Notes (Signed)
PT Cancellation Note  Patient Details Name: Debra [name] MRN: [id number] DOB: 1966/03/05   Cancelled Treatment:     PT attempt. Pt requesting therapist return later this date. Had difficulty night with N & V an is now requesting to get some rest. Had diffult discussion with Dr. Celine Ahr this morning and requested holding PT at this time. Therapist will return later this date to mobilize pt per POC.    Willette Pa 02/03/2020, 11:05 AM

## 2020-02-03 NOTE — Telephone Encounter (Signed)
LC, please schedule her to see MD in 1-2 weeks.  I will get Debra [name] to call her with appt details.  Thanks

## 2020-02-04 ENCOUNTER — Encounter: Payer: Self-pay | Admitting: Family Medicine

## 2020-02-04 ENCOUNTER — Inpatient Hospital Stay: Payer: Self-pay

## 2020-02-04 LAB — BASIC METABOLIC PANEL
Anion gap: 6 (ref 5–15)
BUN: 9 mg/dL (ref 6–20)
CO2: 24 mmol/L (ref 22–32)
Calcium: 8 mg/dL — ABNORMAL LOW (ref 8.9–10.3)
Chloride: 104 mmol/L (ref 98–111)
Creatinine, Ser: 0.5 mg/dL (ref 0.44–1.00)
GFR calc Af Amer: >60 mL/min (ref >60)
GFR calc non Af Amer: >60 mL/min (ref >60)
Glucose, Bld: 132 mg/dL — ABNORMAL HIGH (ref 70–99)
Potassium: 3.8 mmol/L (ref 3.5–5.1)
Sodium: 134 mmol/L — ABNORMAL LOW (ref 135–145)

## 2020-02-04 LAB — CBC
HCT: 28.9 % — ABNORMAL LOW (ref 36.0–46.0)
Hemoglobin: 9.4 g/dL — ABNORMAL LOW (ref 12.0–15.0)
MCH: 27.9 pg (ref 26.0–34.0)
MCHC: 32.5 g/dL (ref 30.0–36.0)
MCV: 85.8 fL (ref 80.0–100.0)
Platelets: 226 10*3/uL (ref 150–400)
RBC: 3.37 MIL/uL — ABNORMAL LOW (ref 3.87–5.11)
RDW: 16.3 % — ABNORMAL HIGH (ref 11.5–15.5)
WBC: 7 10*3/uL (ref 4.0–10.5)
nRBC: 0 % (ref 0.0–0.2)

## 2020-02-04 LAB — PHOSPHORUS: Phosphorus: 2.9 mg/dL (ref 2.5–4.6)

## 2020-02-04 LAB — MAGNESIUM: Magnesium: 2 mg/dL (ref 1.7–2.4)

## 2020-02-04 MED ORDER — ACETAMINOPHEN 650 MG RE SUPP
650.0000 mg | RECTAL | Status: DC | PRN
  Administered 2020-02-04 – 2020-02-05 (×2): 650 mg via RECTAL
  Filled 2020-02-04 (×2): qty 1

## 2020-02-04 MED ORDER — IOHEXOL 9 MG/ML PO SOLN
500.0000 mL | ORAL | Status: AC
  Administered 2020-02-04 (×2): 500 mL via ORAL

## 2020-02-04 MED ORDER — CALCIUM CARBONATE ANTACID 500 MG PO CHEW
1.0000 | CHEWABLE_TABLET | Freq: Three times a day (TID) | ORAL | Status: DC
  Administered 2020-02-04 – 2020-02-06 (×8): 200 mg via ORAL
  Filled 2020-02-04 (×8): qty 1

## 2020-02-04 MED ORDER — METOCLOPRAMIDE HCL 5 MG/ML IJ SOLN
5.0000 mg | Freq: Three times a day (TID) | INTRAMUSCULAR | Status: DC
  Administered 2020-02-04 – 2020-02-05 (×3): 5 mg via INTRAVENOUS
  Filled 2020-02-04 (×3): qty 2

## 2020-02-04 MED ORDER — IOHEXOL 300 MG/ML  SOLN
100.0000 mL | Freq: Once | INTRAMUSCULAR | Status: AC | PRN
  Administered 2020-02-04: 100 mL via INTRAVENOUS

## 2020-02-04 MED ORDER — PANTOPRAZOLE SODIUM 40 MG IV SOLR
40.0000 mg | INTRAVENOUS | Status: DC
  Administered 2020-02-04 – 2020-02-14 (×11): 40 mg via INTRAVENOUS
  Filled 2020-02-04 (×11): qty 40

## 2020-02-04 NOTE — Progress Notes (Signed)
PT Cancellation Note  Patient Details Name: Debra [name] MRN: [id number] DOB: 1966-06-13   Cancelled Treatment:     Pt has been vomiting this date and will hold. NG tube to be placed and pt now NPO. Acute PT will continue to try to see as able per POC.     Willette Pa 02/04/2020, 1:33 PM

## 2020-02-04 NOTE — Progress Notes (Signed)
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  This morning, the patient has been having nausea and feeling a lot of acid reflux and indigestion.  Has not had a bowel movement in 2 days.  KUB yesterday did not show any significant bowel distention.  Later on in the morning, the patient had a large episode of emesis with 900 mL out.  Vital signs: Temp:  [98 F (36.7 C)-99.4 F (37.4 C)] 98.6 F (37 C) (05/29 0523) Pulse Rate:  [73-103] 79 (05/29 0523) Resp:  [15-24] 18 (05/29 0523) BP: (100-107)/(62-68) 107/66 (05/29 0523) SpO2:  [92 %-97 %] 96 % (05/29 0523)   Intake/Output: 05/28 0701 - 05/29 0700 In: 1424.8 [P.O.:360; I.V.:864.8; IV Piggyback:200] Out: -  Last BM Date: 01/30/20  Physical Exam: Constitutional: No acute distress Abdomen: Soft, does not appear that distended, with appropriate tenderness to palpation over the midline incision.  Incision is clean dry intact with staples.  Labs:  Recent Labs    02/03/20 0404 02/04/20 0455  WBC 8.4 7.0  HGB 9.4* 9.4*  HCT 27.7* 28.9*  PLT 199 226   Recent Labs    02/03/20 0404 02/04/20 0455  NA 137 134*  K 3.9 3.8  CL 107 104  CO2 23 24  GLUCOSE 113* 132*  BUN 9 9  CREATININE 0.53 0.50  CALCIUM 8.3* 8.0*   No results for input(s): LABPROT, INR in the last 72 hours.  Imaging: DG ABD ACUTE 2+V W 1V CHEST  Result Date: 02/03/2020 CLINICAL DATA:  Abdominal pain and vomiting, known sigmoid mass EXAM: DG ABDOMEN ACUTE W/ 1V CHEST COMPARISON:  CT from 01/29/2020 FINDINGS: Cardiac shadow is within normal limits. The lungs are well aerated bilaterally. No focal infiltrate or sizable effusion is seen. Abdomen demonstrates postsurgical changes. A relative paucity of bowel gas is noted consistent with the recent history. Mild free air is noted consistent with the recent surgical history. No abnormal mass or abnormal calcifications are noted. IMPRESSION: No  definitive obstructive changes are seen. Postsurgical changes noted consistent with the given clinical history. Mild free air is noted although felt to be expected given the recent surgical history. Electronically Signed   By: Inez Catalina M.D.   On: 02/03/2020 11:18    Assessment/Plan: This is a 54 y.o. female s/p open left colectomy for obstructing colon cancer.  -Given that the patient had another large episode of emesis, I think at this point is best to make her n.p.o. and placed NG tube for potential postop ileus.  We will also get a CT scan of the abdomen pelvis with contrast to further evaluate to make sure there is no other complications from her surgery. -Continue IV fluid hydration, pain control, nausea control. -She is currently on ceftriaxone for UTI.  Today should be the last dose.   Melvyn Neth, [facility]

## 2020-02-05 ENCOUNTER — Inpatient Hospital Stay: Payer: Self-pay

## 2020-02-05 LAB — BASIC METABOLIC PANEL
Anion gap: 9 (ref 5–15)
BUN: 6 mg/dL (ref 6–20)
CO2: 24 mmol/L (ref 22–32)
Calcium: 8 mg/dL — ABNORMAL LOW (ref 8.9–10.3)
Chloride: 101 mmol/L (ref 98–111)
Creatinine, Ser: 0.57 mg/dL (ref 0.44–1.00)
GFR calc Af Amer: >60 mL/min (ref >60)
GFR calc non Af Amer: >60 mL/min (ref >60)
Glucose, Bld: 127 mg/dL — ABNORMAL HIGH (ref 70–99)
Potassium: 3.8 mmol/L (ref 3.5–5.1)
Sodium: 134 mmol/L — ABNORMAL LOW (ref 135–145)

## 2020-02-05 LAB — CBC
HCT: 29.4 % — ABNORMAL LOW (ref 36.0–46.0)
Hemoglobin: 9.9 g/dL — ABNORMAL LOW (ref 12.0–15.0)
MCH: 27.7 pg (ref 26.0–34.0)
MCHC: 33.7 g/dL (ref 30.0–36.0)
MCV: 82.4 fL (ref 80.0–100.0)
Platelets: 269 10*3/uL (ref 150–400)
RBC: 3.57 MIL/uL — ABNORMAL LOW (ref 3.87–5.11)
RDW: 15.9 % — ABNORMAL HIGH (ref 11.5–15.5)
WBC: 8 10*3/uL (ref 4.0–10.5)
nRBC: 0.3 % — ABNORMAL HIGH (ref 0.0–0.2)

## 2020-02-05 LAB — MAGNESIUM: Magnesium: 2 mg/dL (ref 1.7–2.4)

## 2020-02-05 MED ORDER — PIPERACILLIN-TAZOBACTAM 3.375 G IVPB
3.3750 g | Freq: Three times a day (TID) | INTRAVENOUS | Status: DC
  Administered 2020-02-05 – 2020-02-13 (×25): 3.375 g via INTRAVENOUS
  Filled 2020-02-05 (×24): qty 50

## 2020-02-05 MED ORDER — SODIUM CHLORIDE 0.9 % IV SOLN
INTRAVENOUS | Status: DC | PRN
  Administered 2020-02-05 – 2020-02-06 (×2): 250 mL via INTRAVENOUS

## 2020-02-05 NOTE — Progress Notes (Signed)
02/05/2020  Subjective: Patient is 6 Days Post-Op status post open left colectomy for obstructing colon cancer.  Patient had NG tube placed yesterday as well as a CT scan to evaluate for any potential complications after surgery.  CT scan showed no evidence of leak with intact anastomosis, but from my read of the CT scan, there is free fluid in the right gutter as well as an area in the mesentery of locules of air bubbles.  This could be postoperative in nature but there is potential that this could be a developing abscess.  The patient did have a fever of 101.2 last night.  However her WBC remains normal at 8.0.  She denies any worsening pain.  Vital signs: Temp:  [98.6 F (37 C)-101.8 F (38.8 C)] 101.8 F (38.8 C) (05/30 1604) Pulse Rate:  [83-109] 101 (05/30 1245) Resp:  [16-20] 16 (05/30 1245) BP: (116-134)/(68-73) 134/73 (05/30 1245) SpO2:  [94 %-97 %] 95 % (05/30 1245)   Intake/Output: 05/29 0701 - 05/30 0700 In: 1406.7 [I.V.:1306.7; IV Piggyback:100] Out: 2550 [Urine:200; Emesis/NG output:2350] Last BM Date: 01/30/20  Physical Exam: Constitutional: No acute distress Abdomen: Soft, nondistended, with tenderness palpation around the midline incision I also a little bit around the right side of the abdomen near where the free fluid area is in the right gutter.  Labs:  Recent Labs    02/04/20 0455 02/05/20 0658  WBC 7.0 8.0  HGB 9.4* 9.9*  HCT 28.9* 29.4*  PLT 226 269   Recent Labs    02/04/20 0455 02/05/20 0658  NA 134* 134*  K 3.8 3.8  CL 104 101  CO2 24 24  GLUCOSE 132* 127*  BUN 9 6  CREATININE 0.50 0.57  CALCIUM 8.0* 8.0*   No results for input(s): LABPROT, INR in the last 72 hours.  Imaging: DG Abd 1 View  Result Date: 02/05/2020 CLINICAL DATA:  Six days postop from partial colectomy for colon carcinoma. Nausea and vomiting. EXAM: ABDOMEN - 1 VIEW COMPARISON:  02/04/2020 FINDINGS: Nasogastric tube tip is again seen within the distal body of the stomach.  Small amount of bowel gas is seen within the right colon. No evidence of dilated bowel loops. Contrast material is seen within the urinary bladder. IMPRESSION: 1. Nonobstructive bowel gas pattern. 2. Nasogastric tube tip in appropriate position. Electronically Signed   By: Marlaine Hind M.D.   On: 02/05/2020 07:39    Assessment/Plan: This is a 54 y.o. female s/p open left colectomy.  -Given that the patient had a fever last night although she has a normal white blood cell count today, I will start her on IV Zosyn given the concern for the fluid locules of gas in her CT scan that I think could be developing into an abscess.  She is also having mild discomfort in that area on exam.  Will discuss with radiology today, that fluid on CT scan appears to be simple fluid but it would be something to watch with a repeat CT scan over the next 24 to 48 hours. -The patient had more than 2 L of emesis/NG output yesterday.  Although her KUB and CT scan did not show an obstructive pattern or an ileus, I do think she is developed an ileus and so we will keep the NG tube in place. -If this picture continues for the next day or 2, she would need TPN for better nutrition.   Melvyn Neth, [facility]

## 2020-02-05 NOTE — Progress Notes (Signed)
Pharmacy Antibiotic Note  Debra [name] is a 54 y.o. female admitted on 01/29/2020 with IAI.  Pharmacy has been consulted for Zosyn dosing.  Plan: Zosyn 3.375g IV q8h (4 hour infusion).  Height: 5' (152.4 cm) Weight: 86 kg (189 lb 9.5 oz) IBW/kg (Calculated) : 45.5  Temp (24hrs), Avg:99.8 F (37.7 C), Min:98.3 F (36.8 C), Max:101.2 F (38.4 C)  Recent Labs  Lab 01/31/20 0452 02/01/20 0526 02/03/20 0404 02/04/20 0455 02/05/20 0658  WBC 17.5* 10.9* 8.4 7.0 8.0  CREATININE 0.43* 0.54 0.53 0.50 0.57    Estimated Creatinine Clearance: 77.4 mL/min (by C-G formula based on SCr of 0.57 mg/dL).    No Known Allergies  Antimicrobials this admission: Ceftriaxone 5/25 >>5/30 Zosyn 5/30 >>  Dose adjustments this admission:   Microbiology results: 5/25 UCx: NG   Thank you for allowing pharmacy to be a part of this patient's care.  Rocky Morel 02/05/2020 10:36 AM

## 2020-02-05 NOTE — Progress Notes (Signed)
   02/05/20 1604  Assess: MEWS Score  Temp (!) 101.8 F (38.8 C)  Assess: MEWS Score  MEWS Temp 2  MEWS Systolic 0  MEWS Pulse 1  MEWS RR 0  MEWS LOC 0  MEWS Score 3  MEWS Score Color Yellow  Assess: if the MEWS score is Yellow or Red  Were vital signs taken at a resting state? Yes  Focused Assessment Documented focused assessment  Early Detection of Sepsis Score *See Row Information* Medium  MEWS guidelines implemented *See Row Information* Yes  Treat  MEWS Interventions Administered scheduled meds/treatments  Take Vital Signs  Increase Vital Sign Frequency  Yellow: Q 2hr X 2 then Q 4hr X 2, if remains yellow, continue Q 4hrs  Escalate  MEWS: Escalate Yellow: discuss with charge nurse/RN and consider discussing with provider and RRT  Notify: Charge Nurse/RN  Name of Charge Nurse/RN Notified [place]  Date Charge Nurse/RN Notified 02/05/20  Time Charge Nurse/RN Notified 1615  Notify: Provider  Provider Name/Title Dr. Hampton Abbot  Date Provider Notified 02/05/20  Time Provider Notified 1621  Notification Type Page  Notification Reason Change in status  Response Other (Comment);See new orders (MD acknowledged and new orders received.)  Date of Provider Response 02/05/20  Time of Provider Response 1624

## 2020-02-06 ENCOUNTER — Inpatient Hospital Stay: Payer: Self-pay

## 2020-02-06 LAB — PHOSPHORUS: Phosphorus: 1.9 mg/dL — ABNORMAL LOW (ref 2.5–4.6)

## 2020-02-06 LAB — CBC
HCT: 31.3 % — ABNORMAL LOW (ref 36.0–46.0)
Hemoglobin: 10.3 g/dL — ABNORMAL LOW (ref 12.0–15.0)
MCH: 27.7 pg (ref 26.0–34.0)
MCHC: 32.9 g/dL (ref 30.0–36.0)
MCV: 84.1 fL (ref 80.0–100.0)
Platelets: 338 10*3/uL (ref 150–400)
RBC: 3.72 MIL/uL — ABNORMAL LOW (ref 3.87–5.11)
RDW: 15.4 % (ref 11.5–15.5)
WBC: 11.1 10*3/uL — ABNORMAL HIGH (ref 4.0–10.5)
nRBC: 0 % (ref 0.0–0.2)

## 2020-02-06 LAB — BASIC METABOLIC PANEL
Anion gap: 7 (ref 5–15)
BUN: <5 mg/dL — ABNORMAL LOW (ref 6–20)
CO2: 23 mmol/L (ref 22–32)
Calcium: 7.9 mg/dL — ABNORMAL LOW (ref 8.9–10.3)
Chloride: 102 mmol/L (ref 98–111)
Creatinine, Ser: 0.52 mg/dL (ref 0.44–1.00)
GFR calc Af Amer: >60 mL/min (ref >60)
GFR calc non Af Amer: >60 mL/min (ref >60)
Glucose, Bld: 114 mg/dL — ABNORMAL HIGH (ref 70–99)
Potassium: 3.9 mmol/L (ref 3.5–5.1)
Sodium: 132 mmol/L — ABNORMAL LOW (ref 135–145)

## 2020-02-06 LAB — GLUCOSE, CAPILLARY: Glucose-Capillary: 111 mg/dL — ABNORMAL HIGH (ref 70–99)

## 2020-02-06 LAB — MAGNESIUM: Magnesium: 2 mg/dL (ref 1.7–2.4)

## 2020-02-06 MED ORDER — INSULIN ASPART 100 UNIT/ML ~~LOC~~ SOLN
0.0000 [IU] | Freq: Four times a day (QID) | SUBCUTANEOUS | Status: DC
  Administered 2020-02-07 – 2020-02-09 (×9): 1 [IU] via SUBCUTANEOUS
  Administered 2020-02-09 – 2020-02-10 (×2): 2 [IU] via SUBCUTANEOUS
  Administered 2020-02-10: 1 [IU] via SUBCUTANEOUS
  Administered 2020-02-10 (×2): 2 [IU] via SUBCUTANEOUS
  Administered 2020-02-11 – 2020-02-12 (×5): 1 [IU] via SUBCUTANEOUS
  Filled 2020-02-06 (×19): qty 1

## 2020-02-06 MED ORDER — FAT EMULSION PLANT BASED 20 % IV EMUL
250.0000 mL | INTRAVENOUS | Status: AC
  Administered 2020-02-06: 250 mL via INTRAVENOUS
  Filled 2020-02-06: qty 250

## 2020-02-06 MED ORDER — SODIUM CHLORIDE 0.9% FLUSH
10.0000 mL | Freq: Two times a day (BID) | INTRAVENOUS | Status: DC
  Administered 2020-02-06 – 2020-02-09 (×3): 10 mL
  Administered 2020-02-10: 40 mL
  Administered 2020-02-10 – 2020-02-14 (×8): 10 mL

## 2020-02-06 MED ORDER — SODIUM CHLORIDE 0.9% FLUSH
10.0000 mL | INTRAVENOUS | Status: DC | PRN
  Administered 2020-02-06: 10 mL

## 2020-02-06 MED ORDER — INSULIN ASPART 100 UNIT/ML ~~LOC~~ SOLN: 0.0000 [IU] | SUBCUTANEOUS | Status: DC

## 2020-02-06 MED ORDER — POTASSIUM PHOSPHATES 15 MMOLE/5ML IV SOLN
20.0000 mmol | Freq: Once | INTRAVENOUS | Status: AC
  Administered 2020-02-06: 20 mmol via INTRAVENOUS
  Filled 2020-02-06: qty 6.67

## 2020-02-06 MED ORDER — CHLORHEXIDINE GLUCONATE CLOTH 2 % EX PADS
6.0000 | MEDICATED_PAD | Freq: Every day | CUTANEOUS | Status: DC
  Administered 2020-02-06 – 2020-02-14 (×8): 6 via TOPICAL

## 2020-02-06 MED ORDER — SODIUM CHLORIDE 0.9 % IV SOLN: INTRAVENOUS | Status: AC

## 2020-02-06 MED ORDER — SODIUM CHLORIDE 0.9 % IV SOLN: INTRAVENOUS | Status: DC

## 2020-02-06 MED ORDER — TRACE MINERALS CU-MN-SE-ZN 300-55-60-3000 MCG/ML IV SOLN
INTRAVENOUS | Status: AC
  Filled 2020-02-06: qty 720

## 2020-02-06 NOTE — Progress Notes (Signed)
Peripherally Inserted Central Catheter Placement  The IV Nurse has discussed with the patient and/or persons authorized to consent for the patient, the purpose of this procedure and the potential benefits and risks involved with this procedure.  The benefits include less needle sticks, lab draws from the catheter, and the patient may be discharged home with the catheter. Risks include, but not limited to, infection, bleeding, blood clot (thrombus formation), and puncture of an artery; nerve damage and irregular heartbeat and possibility to perform a PICC exchange if needed/ordered by physician.  Alternatives to this procedure were also discussed.  Bard Power PICC patient education guide, fact sheet on infection prevention and patient information card has been provided to patient /or left at bedside.    PICC Placement Documentation  PICC Double Lumen 02/06/20 PICC Right Cephalic 37 cm 0 cm (Active)  Indication for Insertion or Continuance of Line Administration of hyperosmolar/irritating solutions (i.e. TPN, Vancomycin, etc.) 02/06/20 1341  Exposed Catheter (cm) 0 cm 02/06/20 1341  Site Assessment Clean;Dry;Intact 02/06/20 1341  Lumen #1 Status Flushed;Blood return noted 02/06/20 1341  Lumen #2 Status Flushed;Blood return noted 02/06/20 1341  Dressing Type Transparent 02/06/20 1341  Dressing Status Clean;Dry;Intact;Antimicrobial disc in place;Other (Comment) 02/06/20 1341  Dressing Intervention New dressing 02/06/20 1341  Dressing Change Due 02/13/20 02/06/20 1341       Debra [name] 02/06/2020, 1:42 PM

## 2020-02-06 NOTE — Progress Notes (Signed)
PICC order received. Secure chat with Dr. [name] about PICC placement with patient temp of 101 F. Per MD to place PICC for nutrition.

## 2020-02-06 NOTE — Progress Notes (Signed)
02/06/2020  Subjective: Patient is 7 Days Post-Op status post open left colectomy for obstructing colon cancer.  Patient had NG tube placed as well as a CT scan to evaluate for any potential complications after surgery.  CT scan showed no evidence of leak with intact anastomosis, but there may be a developing abscess  The patient did have another fever of 101.8 last night.  WBC up this AM, as well.  Patient complaining of productive cough.  Vital signs: Temp:  [98.6 F (37 C)-101.8 F (38.8 C)] 99.2 F (37.3 C) (05/31 0437) Pulse Rate:  [93-105] 99 (05/31 0437) Resp:  [14-24] 24 (05/31 0437) BP: (110-134)/(65-73) 117/66 (05/31 0437) SpO2:  [93 %-97 %] 96 % (05/31 0437)   Intake/Output: 05/30 0701 - 05/31 0700 In: 3045.8 [I.V.:2975.6; IV Piggyback:70.2] Out: 1000 [Urine:400; Emesis/NG output:600] Last BM Date: 02/06/20  Physical Exam: Constitutional: No acute distress Abdomen: Soft, nondistended, with mild tenderness to palpation around the midline incision.  Labs:  Recent Labs    02/05/20 0658 02/06/20 0428  WBC 8.0 11.1*  HGB 9.9* 10.3*  HCT 29.4* 31.3*  PLT 269 338   Recent Labs    02/05/20 0658 02/06/20 0428  NA 134* 132*  K 3.8 3.9  CL 101 102  CO2 24 23  GLUCOSE 127* 114*  BUN 6 <5*  CREATININE 0.57 0.52  CALCIUM 8.0* 7.9*   No results for input(s): LABPROT, INR in the last 72 hours.  Imaging: [name] EKG SITE RITE  Result Date: 02/06/2020 If Site Rite image not attached, placement could not be confirmed due to current cardiac rhythm.   Assessment/Plan: This is a 54 y.o. female s/p open left colectomy, now with ileus.  Suspect early signs of bowel function were due to post-obstructive diarrhea. Low serum calcium likely reflects poor protein stores, though albumin has not been checked.  -continues to spike fevers, now with productive cough --continue Zosyn --CXR today --plan to rescan abdomen tomorrow to re-eval for abscess formation --place PICC and start  TPN

## 2020-02-06 NOTE — Progress Notes (Signed)
Initial Nutrition Assessment  DOCUMENTATION CODES:   Obesity unspecified  INTERVENTION:  Once PICC is placed and confirmed initiate Clinimix E 5/20 at 30 mL/hr x 24 hrs + 20% ILE at 20 mL/hr x 12 hrs.  Recommend slow advancement of TPN as patient is at risk for refeeding syndrome. Patient's goal TPN regimen is Clinimix E 5/20 at 83 mL/hr x 24 hrs + 20% ILE at 20 mL/hr x 12 hrs. Provides 2233 kcal, 100 grams of protein, 1992 mL fluid from Clinimix daily.  Provide adult MVI and trace elements as daily TPN additives. Provide thiamine 100 mg daily x 3 days in TPN.  Recommend measuring daily weights on TPN.  Monitor magnesium, potassium, and phosphorus daily for at least 3 days, MD to replete as needed, as pt is at risk for refeeding syndrome given prolonged inadequate oral intake.  NUTRITION DIAGNOSIS:   Increased nutrient needs related to catabolic illness(obstructing sigmoid colon mass (invasive adenocarcinoma) s/p left hemicolectomy, recent COVID-19) as evidenced by estimated needs.  GOAL:   Patient will meet greater than or equal to 90% of their needs  MONITOR:   Diet advancement, Labs, Weight trends, I & O's, Skin  REASON FOR ASSESSMENT:   Consult New TPN/TNA  ASSESSMENT:   54 year old female with PMHx of hypothyroidism, recent COVID-19 11/20/2019 who was admitted with obstructing sigmoid colon mass s/p open left hemicolectomy with mobilization of the splenic flexure on 5/24 (invasive adenocarcinoma, poorly differentiated per surgical pathology), now with post-operative ileus.   -Patient recently had [place] in 11/2019. She was also positive for COVID on 01/29/2020. Per review of chart RN discussed with infection prevention on 5/24 and patient does not meet the protocol to be placed on airborne precautions.  Met with patient at bedside with assistance of [place] interpreter [phone]). Patient had NGT in place but it was clamped. She reports she was initially able to eat after her  surgery but in the past 4 days could not eat well. However, she was really only on full liquids for one day and soft diet for one day. She reports normal appetite prior to admission. Patient reports she discussed plan for TPN with MD and does not have any questions about this time.  Patient reports her UBW is 193 lbs (87.7 kg) and she denies any recent weight loss. According to chart she was 97.5 kg on 11/20/2019, 85.7 kg on 12/30/2019, and is now 86 kg (189.6 lbs). If weight history in chart is correct patient lost 11.8 kg (12.1% body weight) over approximately 6 weeks, which is significant for time frame. However, weight of 97.5 kg may not have been accurate as patient is reporting she has not lost weight.  IV Access: order in for PICC placement today  Medications reviewed and include: calcium carbonate 1 tablet TID, Novolog 0-9 units Q6hrs, levothyroxine, Protonix, D5-1/2NS with KCl 20 mEq/L at 100 mL/hr, Zosyn.  Labs reviewed: Sodium 132, BUN <5, Phosphorus 1.9.  Patient does not meet criteria for malnutrition at this time but is at risk for malnutrition.  Discussed with RN and Pharmacy. Discussed with MD via secure chat. Plan is for total fluid volume of 100 mL/hr including TPN rate. IV fluids are being changed to NS and dextrose is being removed.  NUTRITION - FOCUSED PHYSICAL EXAM:   Most Recent Value  Orbital Region  No depletion  Upper Arm Region  No depletion  Thoracic and Lumbar Region  No depletion  Buccal Region  No depletion  [employer]  No depletion  Clavicle Bone Region  No depletion  Clavicle and Acromion Bone Region  No depletion  Scapular Bone Region  No depletion  Dorsal Hand  No depletion  Patellar Region  No depletion  Anterior Thigh Region  No depletion  Posterior Calf Region  No depletion  Edema (RD Assessment)  Mild  Hair  Reviewed  Eyes  Reviewed  Mouth  Reviewed  Skin  Reviewed  Nails  Reviewed     Diet Order:   Diet Order            Diet NPO time  specified  Diet effective now             EDUCATION NEEDS:   No education needs have been identified at this time  Skin:  Skin Assessment: Skin Integrity Issues:(stage 2 left buttocks; closed incision to abdomen)  Last BM:  02/06/2020- medium type 6  Height:   Ht Readings from Last 1 Encounters:  01/30/20 5' (1.524 m)   Weight:   Wt Readings from Last 1 Encounters:  01/30/20 86 kg   Ideal Body Weight:  45.5 kg  BMI:  Body mass index is 37.03 kg/m.  Estimated Nutritional Needs:   Kcal:  2100-2300  Protein:  105-120 grams  Fluid:  2.1 L/day  Jacklynn Barnacle, MS, RD, LDN Pager number available on Amion

## 2020-02-06 NOTE — Consult Note (Signed)
PHARMACY - TOTAL PARENTERAL NUTRITION CONSULT NOTE   Indication:Prolonged Ileus  Patient Measurements: Height: 5' (152.4 cm) Weight: 88 kg (193 lb 14.4 oz) IBW/kg (Calculated) : 45.5 TPN AdjBW (KG): 55.6 Body mass index is 37.87 kg/m.  Assessment:  54 yo female with post-operative ileus. Patient was admitted with obstructing sigmoid colon mass and had open left hemicolectomy 5/24. She has PMH of Anemia, hypothyroidism, and recent COVID infection (March 2021)  Glucose / Insulin: Sensitive SSI- No Hx of Dm Electrolytes: K: 3.9, Phos: 1.9, Mg: 2.0  Renal: Scr: 0.52  LFTs / TGs:  Prealbumin / albumin:  Intake / Output; MIVF: NaCl @ 71ml/hr  GI Imaging: Surgeries / Procedures: s/p open left hemicolectomy with mobilization of the splenic flexure on 5/24    Central access: 5/31 TPN start date: 5/31  Nutritional Goals (per RD recommendation on 5/31): Clinimix E 5/20 at 83 mL/hr x 24 hrs + 20% ILE at 20 mL/hr x 12 hrs. Provides 2233 kcal, 100 grams of protein, 1992 mL fluid from Clinimix daily.  Current Nutrition:  NaCl @ 17ml/hr   Plan:  Start Clinimix E 5/20 at 30 mL/hr x 24 hrs + 20% ILE at 20 mL/hr x 12 hrs at 1800 this evening.  RD Recommending slow advancement due to risk for refeeding syndrome.  Add standard MVI and trace elements to TPN.  Thiamine 100mg  added to TPN x 3 days (5/31-6/2)  Phos: 1.9- KPhos 72mmol IV x 1 dose ordered.  Initiate Sensitive q6h SSI and adjust as needed  Reduce MIVF to 70 mL/hr at 1800  Monitor TPN labs on Mon/Thurs. Will monitor electrolytes daily x3 days.   Pernell Dupre, PharmD, BCPS Clinical Pharmacist 02/06/2020 2:21 PM

## 2020-02-07 ENCOUNTER — Inpatient Hospital Stay: Payer: Self-pay

## 2020-02-07 ENCOUNTER — Encounter: Payer: Self-pay | Admitting: Family Medicine

## 2020-02-07 LAB — DIFFERENTIAL
Abs Immature Granulocytes: 0.18 10*3/uL — ABNORMAL HIGH (ref 0.00–0.07)
Basophils Absolute: 0 10*3/uL (ref 0.0–0.1)
Basophils Relative: 0 %
Eosinophils Absolute: 0.2 10*3/uL (ref 0.0–0.5)
Eosinophils Relative: 2 %
Immature Granulocytes: 2 %
Lymphocytes Relative: 7 %
Lymphs Abs: 0.8 10*3/uL (ref 0.7–4.0)
Monocytes Absolute: 0.8 10*3/uL (ref 0.1–1.0)
Monocytes Relative: 7 %
Neutro Abs: 9.2 10*3/uL — ABNORMAL HIGH (ref 1.7–7.7)
Neutrophils Relative %: 82 %

## 2020-02-07 LAB — COMPREHENSIVE METABOLIC PANEL
ALT: 12 U/L (ref 0–44)
AST: 19 U/L (ref 15–41)
Albumin: 2.1 g/dL — ABNORMAL LOW (ref 3.5–5.0)
Alkaline Phosphatase: 81 U/L (ref 38–126)
Anion gap: 6 (ref 5–15)
BUN: <5 mg/dL — ABNORMAL LOW (ref 6–20)
CO2: 24 mmol/L (ref 22–32)
Calcium: 7.7 mg/dL — ABNORMAL LOW (ref 8.9–10.3)
Chloride: 104 mmol/L (ref 98–111)
Creatinine, Ser: 0.43 mg/dL — ABNORMAL LOW (ref 0.44–1.00)
GFR calc Af Amer: >60 mL/min (ref >60)
GFR calc non Af Amer: >60 mL/min (ref >60)
Glucose, Bld: 142 mg/dL — ABNORMAL HIGH (ref 70–99)
Potassium: 3.4 mmol/L — ABNORMAL LOW (ref 3.5–5.1)
Sodium: 134 mmol/L — ABNORMAL LOW (ref 135–145)
Total Bilirubin: 0.5 mg/dL (ref 0.3–1.2)
Total Protein: 6 g/dL — ABNORMAL LOW (ref 6.5–8.1)

## 2020-02-07 LAB — GLUCOSE, CAPILLARY
Glucose-Capillary: 108 mg/dL — ABNORMAL HIGH (ref 70–99)
Glucose-Capillary: 125 mg/dL — ABNORMAL HIGH (ref 70–99)
Glucose-Capillary: 133 mg/dL — ABNORMAL HIGH (ref 70–99)
Glucose-Capillary: 135 mg/dL — ABNORMAL HIGH (ref 70–99)
Glucose-Capillary: 137 mg/dL — ABNORMAL HIGH (ref 70–99)

## 2020-02-07 LAB — CBC
HCT: 27.1 % — ABNORMAL LOW (ref 36.0–46.0)
Hemoglobin: 9 g/dL — ABNORMAL LOW (ref 12.0–15.0)
MCH: 27.8 pg (ref 26.0–34.0)
MCHC: 33.2 g/dL (ref 30.0–36.0)
MCV: 83.6 fL (ref 80.0–100.0)
Platelets: 299 10*3/uL (ref 150–400)
RBC: 3.24 MIL/uL — ABNORMAL LOW (ref 3.87–5.11)
RDW: 15.1 % (ref 11.5–15.5)
WBC: 11.3 10*3/uL — ABNORMAL HIGH (ref 4.0–10.5)
nRBC: 0 % (ref 0.0–0.2)

## 2020-02-07 LAB — PREALBUMIN: Prealbumin: <5 mg/dL — ABNORMAL LOW (ref 18–38)

## 2020-02-07 LAB — SURGICAL PATHOLOGY

## 2020-02-07 LAB — TRIGLYCERIDES: Triglycerides: 115 mg/dL (ref <150)

## 2020-02-07 LAB — MAGNESIUM: Magnesium: 2 mg/dL (ref 1.7–2.4)

## 2020-02-07 LAB — PHOSPHORUS: Phosphorus: 2 mg/dL — ABNORMAL LOW (ref 2.5–4.6)

## 2020-02-07 MED ORDER — TRACE MINERALS CU-MN-SE-ZN 300-55-60-3000 MCG/ML IV SOLN
60.0000 mL/h | INTRAVENOUS | Status: AC
  Filled 2020-02-07: qty 1440

## 2020-02-07 MED ORDER — SODIUM CHLORIDE 0.9 % IV SOLN: INTRAVENOUS | Status: DC

## 2020-02-07 MED ORDER — IOHEXOL 300 MG/ML  SOLN
100.0000 mL | Freq: Once | INTRAMUSCULAR | Status: AC | PRN
  Administered 2020-02-07: 100 mL via INTRAVENOUS

## 2020-02-07 MED ORDER — FAT EMULSION PLANT BASED 20 % IV EMUL
250.0000 mL | INTRAVENOUS | Status: AC
  Administered 2020-02-07: 250 mL via INTRAVENOUS
  Filled 2020-02-07: qty 250

## 2020-02-07 MED ORDER — POTASSIUM PHOSPHATES 15 MMOLE/5ML IV SOLN
30.0000 mmol | Freq: Once | INTRAVENOUS | Status: AC
  Administered 2020-02-07: 30 mmol via INTRAVENOUS
  Filled 2020-02-07: qty 10

## 2020-02-07 MED ORDER — IOHEXOL 9 MG/ML PO SOLN
500.0000 mL | ORAL | Status: AC
  Administered 2020-02-07 (×2): 500 mL via ORAL

## 2020-02-07 MED ORDER — ENOXAPARIN SODIUM 40 MG/0.4ML ~~LOC~~ SOLN
40.0000 mg | SUBCUTANEOUS | Status: DC
  Administered 2020-02-09 – 2020-02-12 (×4): 40 mg via SUBCUTANEOUS
  Filled 2020-02-07 (×4): qty 0.4

## 2020-02-07 NOTE — H&P (Addendum)
Chief Complaint: Abdominal pain  Referring Physician(s): [name], MD  Supervising Physician: Aletta Edouard  Patient Status: [facility] - In-pt  History of Present Illness: Debra [name] is a 54 y.o. 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  Her WBC has continued to be slightly elevated so CT scan was obtained which showed no evidence of leak with intact anastomosis, but there may be a developing abscess.    We are asked to aspirate this and send for culture.      Past Medical History:  Diagnosis Date  . Anemia   . Hypothyroidism   . Thyroid disease     Past Surgical History:  Procedure Laterality Date  . COLONOSCOPY WITH PROPOFOL N/A 11/21/2019   Procedure: COLONOSCOPY WITH PROPOFOL;  Surgeon: Lin Landsman, MD;  Location: [facility] ENDOSCOPY;  Service: Gastroenterology;  Laterality: N/A;  . CYSTOSCOPY WITH STENT PLACEMENT Left 01/30/2020   Procedure: CYSTOSCOPY WITH STENT PLACEMENT;  Surgeon: Hollice Espy, MD;  Location: [facility] ORS;  Service: Urology;  Laterality: Left;  . ESOPHAGOGASTRODUODENOSCOPY (EGD) WITH PROPOFOL N/A 11/21/2019   Procedure: ESOPHAGOGASTRODUODENOSCOPY (EGD) WITH PROPOFOL;  Surgeon: Lin Landsman, MD;  Location: [facility] ENDOSCOPY;  Service: Gastroenterology;  Laterality: N/A;  . PARTIAL COLECTOMY N/A 01/30/2020   Procedure: PARTIAL COLECTOMY Sigmoid;  Surgeon: [name], MD;  Location: [facility] ORS;  Service: General;  Laterality: N/A;    Allergies: Patient has no known allergies.  Medications: Prior to Admission medications   Medication Sig Start Date End Date Taking? Authorizing Provider  bisacodyl (DULCOLAX) 5 MG EC tablet Take all 4 tablets at 8 am the morning prior to your surgery. 01/05/20  Yes [name], MD  levothyroxine (SYNTHROID) 137 MCG tablet Take 137 mcg by mouth daily before breakfast.    Yes [provider]  polyethylene glycol powder (MIRALAX) 17 GM/SCOOP  powder Mix full container in 64 ounces of Gatorade or other clear liquid. NO Red 01/05/20  Yes [name], MD  senna-docusate (SENOKOT-S) 8.6-50 MG tablet Take 1 tablet by mouth at bedtime. 11/22/19  Yes Fritzi Mandes, MD  erythromycin base (E-MYCIN) 500 MG tablet Take 2 tablets at 8am, 2 tablets at 2pm, and 2 tablets at 8pm the day prior to surgery. Patient not taking: Reported on 01/20/2020 01/05/20   [name], MD  ferrous sulfate 325 (65 FE) MG tablet Take 1 tablet (325 mg total) by mouth 2 (two) times daily with a meal. Patient not taking: Reported on 01/11/2020 11/22/19   Fritzi Mandes, MD  neomycin (MYCIFRADIN) 500 MG tablet Take 2 tablet at 8am, take 2 tablets at 2pm, and take 2 tablets at 8pm the day prior to your surgery Patient not taking: Reported on 01/29/2020 01/05/20   [name], MD     Family History  Problem Relation Age of Onset  . Diabetes Sister     Social History   Socioeconomic History  . Marital status: Married    Spouse name: Not on file  . Number of children: Not on file  . Years of education: Not on file  . Highest education level: Not on file  Occupational History  . Occupation: Factory  Tobacco Use  . Smoking status: Never Smoker  . Smokeless tobacco: Never Used  Substance and Sexual Activity  . Alcohol use: Never  . Drug use: Never  . Sexual activity: Not on file  Other Topics Concern  . Not on file  Social History Narrative  . Not  on file   Social Determinants of Health   Financial Resource Strain:   . Difficulty of Paying Living Expenses:   Food Insecurity:   . Worried About Charity fundraiser in the Last Year:   . Arboriculturist in the Last Year:   Transportation Needs:   . Film/video editor (Medical):   Marland Kitchen Lack of Transportation (Non-Medical):   Physical Activity:   . Days of Exercise per Week:   . Minutes of Exercise per Session:   Stress:   . Feeling of Stress :   Social Connections:   . Frequency of Communication  with Friends and Family:   . Frequency of Social Gatherings with Friends and Family:   . Attends Religious Services:   . Active Member of Clubs or Organizations:   . Attends Archivist Meetings:   Marland Kitchen Marital Status:      Review of Systems: A 12 point ROS discussed and pertinent positives are indicated in the HPI above.  All other systems are negative.  Review of Systems  Vital Signs: BP 119/73 (BP Location: Left Arm)   Pulse 78   Temp 98.7 F (37.1 C) (Oral)   Resp 18   Ht 5' (1.524 m)   Wt 88 kg   SpO2 98%   BMI 37.87 kg/m   Physical Exam Vitals reviewed.  Constitutional:      Appearance: Normal appearance.     Comments: Appears older than chronological age.  HENT:     Head: Normocephalic and atraumatic.     Comments: NGT to low wall suction Eyes:     Extraocular Movements: Extraocular movements intact.  Cardiovascular:     Rate and Rhythm: Normal rate and regular rhythm.  Pulmonary:     Effort: Pulmonary effort is normal. No respiratory distress.     Breath sounds: Normal breath sounds.  Abdominal:     Tenderness: There is abdominal tenderness.  Musculoskeletal:        General: Normal range of motion.  Skin:    General: Skin is warm and dry.  Neurological:     General: No focal deficit present.     Mental Status: She is alert and oriented to person, place, and time.  Psychiatric:        Mood and Affect: Mood normal.        Behavior: Behavior normal.        Thought Content: Thought content normal.        Judgment: Judgment normal.     Imaging: DG Chest 2 View  Result Date: 02/06/2020 CLINICAL DATA:  Productive cough with yellow sputum;  Nonsmoker, EXAM: CHEST - 2 VIEW COMPARISON:  None. FINDINGS: NG tube extends the stomach. Side port below the GE junction. Normal cardiac silhouette. No effusion, infiltrate pneumothorax. IMPRESSION: NG tube in stomach.  No acute cardiopulmonary process. Electronically Signed   By: Suzy Bouchard M.D.   On:  02/06/2020 11:24   DG Abd 1 View  Result Date: 02/05/2020 CLINICAL DATA:  Six days postop from partial colectomy for colon carcinoma. Nausea and vomiting. EXAM: ABDOMEN - 1 VIEW COMPARISON:  02/04/2020 FINDINGS: Nasogastric tube tip is again seen within the distal body of the stomach. Small amount of bowel gas is seen within the right colon. No evidence of dilated bowel loops. Contrast material is seen within the urinary bladder. IMPRESSION: 1. Nonobstructive bowel gas pattern. 2. Nasogastric tube tip in appropriate position. Electronically Signed   By: Myles Rosenthal.D.  On: 02/05/2020 07:39   DG Abd 1 View  Result Date: 02/04/2020 CLINICAL DATA:  Nasogastric tube placement. Status post resection of sigmoid colon cancer. EXAM: ABDOMEN - 1 VIEW COMPARISON:  02/03/2020 FINDINGS: Nasogastric tube is been placed which extends into the stomach with the tip located in the expected position of the mid to distal body. No significant ileus or obstructive bowel gas pattern identified. IMPRESSION: Nasogastric tube extends into the stomach with the tip in the expected position of the mid to distal body. Electronically Signed   By: Aletta Edouard M.D.   On: 02/04/2020 11:19   CT ABDOMEN PELVIS W CONTRAST  Result Date: 02/07/2020 CLINICAL DATA:  53 year old female with history of left colectomy for obstructing colon cancer presenting with signs and symptoms of possible abdominal infection or abscess. EXAM: CT ABDOMEN AND PELVIS WITH CONTRAST TECHNIQUE: Multidetector CT imaging of the abdomen and pelvis was performed using the standard protocol following bolus administration of intravenous contrast. CONTRAST:  132mL OMNIPAQUE IOHEXOL 300 MG/ML  SOLN COMPARISON:  CT the abdomen and pelvis 02/04/2020. FINDINGS: Lower chest: Small bilateral pleural effusions with some dependent subsegmental atelectasis in the lower lobes of the lungs bilaterally. Nasogastric tube. Central venous catheter tip extending into the right  atrium in the plane of the tricuspid valve. Hepatobiliary: No suspicious cystic or solid hepatic lesions. No intra or extrahepatic biliary ductal dilatation. Gallbladder is normal in appearance. Pancreas: No pancreatic mass. No pancreatic ductal dilatation. No peripancreatic inflammatory changes. Spleen: Unremarkable. Adrenals/Urinary Tract: Bilateral kidneys and adrenal glands are normal in appearance. No hydroureteronephrosis. Urinary bladder is normal in appearance. Stomach/Bowel: Nasogastric tube terminating in the distal gastric body. Stomach is otherwise normal in appearance. No pathologic dilatation of small bowel or colon. Status post left hemicolectomy. Immediately posterior to the suture line (axial image 58 of series 2, coronal image 41 of series 5, and sagittal image 81 of series 6) there is a small collection of extraluminal gas concerning for potential anastomotic suture breakdown. Normal appendix. Vascular/Lymphatic: No significant atherosclerotic disease, aneurysm or dissection noted in the abdominal or pelvic vasculature. No lymphadenopathy identified in the abdomen or pelvis. Reproductive: Uterus and ovaries are unremarkable in appearance. Other: Small volume of ascites, most evident in the right pericolic gutter where the collection of fluid is similar in size to the prior study from 02/04/2020. No definite rim enhancement to this collection in the right pericolic gutter. However, in the left upper quadrant of the abdomen adjacent to the tail of the pancreas and the splenic hilum (axial image 25 of series 2 and coronal image 62 of series 5) there is a 3.6 x 3.0 x 4.4 cm low-attenuation fluid and gas collection which appears slightly increased compared to the prior study with some mild rim enhancement. There is an adjacent collection inferior to the spleen (axial image 32 of series 2 and coronal image 69 of series 5) measuring 4.6 x 1.9 x 3.6 cm which is also centrally low-attenuation with some  mild peripheral enhancement, similar to the prior examination. Small amounts of pneumoperitoneum are noted throughout the peritoneal cavity, including several locules of gas within the omentum and sigmoid mesocolon. Musculoskeletal: There are no aggressive appearing lytic or blastic lesions noted in the visualized portions of the skeleton. Healing midline abdominal wound with overlying skin staples. Within the subcutaneous fat of the anterior abdominal wall superficial to the anterior abdominal wall musculature but deep to the skin sutures there are several small collections of fluid which appear increasing compared to  the prior examination, largest of which measures 2.1 x 1.5 cm (axial image 68 of series 3). IMPRESSION: 1. Small amount of gas immediately adjacent to the anastomotic suture line from the recent left hemicolectomy which appears to be extraluminal, concerning for potential breakdown of the anastomotic suture line. 2. Small volume of pneumoperitoneum and ascites which generally appears similar to the prior study, with exception of slight enlargement of a gas and fluid containing collection in the left upper quadrant adjacent to the tail of the pancreas and splenic hilum. This and an adjacent fluid collection demonstrates some peripheral enhancement. The possibility of developing intra-abdominal abscesses are not excluded. 3. Increasing fluid in the subcutaneous fat of the healing anterior abdominal wound, which may represent seromas, although the possibility of developing wound infection is not excluded. 4. Small bilateral pleural effusions with areas of dependent subsegmental atelectasis in the lower lobes of the lungs bilaterally. Electronically Signed   By: Vinnie Langton M.D.   On: 02/07/2020 13:15   CT ABDOMEN PELVIS W CONTRAST  Result Date: 02/04/2020 CLINICAL DATA:  History of recent partial colectomy for sigmoid carcinoma EXAM: CT ABDOMEN AND PELVIS WITH CONTRAST TECHNIQUE: Multidetector  CT imaging of the abdomen and pelvis was performed using the standard protocol following bolus administration of intravenous contrast. CONTRAST:  120mL OMNIPAQUE IOHEXOL 300 MG/ML  SOLN COMPARISON:  01/29/2020 FINDINGS: Lower chest: Bilateral small effusions are noted. Lower lobe atelectasis is noted consistent with the postoperative state. Hepatobiliary: Liver is within normal limits. Gallbladder is unremarkable. Pancreas: Unremarkable. No pancreatic ductal dilatation or surrounding inflammatory changes. Spleen: Normal in size without focal abnormality. Adrenals/Urinary Tract: Adrenal glands are within normal limits. The kidneys demonstrate a normal enhancement pattern bilaterally. Normal excretion of contrast is noted bilaterally. No obstructive changes are seen. The bladder is partially distended. Tiny focus of air is noted within the bladder consistent with prior instrumentation. Stomach/Bowel: Postsurgical changes are noted within the colon consistent with the recent surgery. Anastomosis between the mid transverse colon and sigmoid colon is noted. Air is noted within the transverse colon just proximal to the anastomosis although no definitive extravasation is noted. Mild fluid is noted adjacent to the anastomosis likely postoperative in nature. Small amount of fluid is noted along the liver margin inferiorly and along the dome of the liver also likely related to the prior surgery. Stomach is well distended with contrast material. Small-bowel appears nonobstructive. The appendix is within normal limits. Vascular/Lymphatic: No significant vascular findings are present. No enlarged abdominal or pelvic lymph nodes. Reproductive: Uterus and bilateral adnexa are unremarkable. Other: Mild free fluid is noted within the abdomen as described consistent with the postoperative state. No findings to suggest perforation are noted at this time. Musculoskeletal: Degenerative changes of the lumbar spine are noted. IMPRESSION:  Changes consistent with the prior surgery with small amount of residual fluid within the abdomen. No evidence of perforation is noted at this time. Bilateral small effusions and atelectatic changes consistent with the postoperative state. Electronically Signed   By: Inez Catalina M.D.   On: 02/04/2020 13:48   CT ABDOMEN PELVIS W CONTRAST  Result Date: 01/29/2020 CLINICAL DATA:  Generalized abdominal pain, vomiting, sigmoid mass suspicious for malignancy EXAM: CT ABDOMEN AND PELVIS WITH CONTRAST TECHNIQUE: Multidetector CT imaging of the abdomen and pelvis was performed using the standard protocol following bolus administration of intravenous contrast. CONTRAST:  113mL OMNIPAQUE IOHEXOL 300 MG/ML  SOLN COMPARISON:  01/03/2020 FINDINGS: Lower chest: No acute pleural or parenchymal lung disease. Hepatobiliary: No  focal liver abnormality is seen. No gallstones, gallbladder wall thickening, or biliary dilatation. Pancreas: Unremarkable. No pancreatic ductal dilatation or surrounding inflammatory changes. Spleen: Normal in size without focal abnormality. Adrenals/Urinary Tract: Adrenal glands are unremarkable. Kidneys are normal, without renal calculi, focal lesion, or hydronephrosis. Bladder is unremarkable. Stomach/Bowel: Segmental circumferential wall thickening within the proximal sigmoid colon is again seen, measuring 5 cm in length, concerning for malignancy. There is significant proximal colonic distention with large amount of retained stool. There may be an element of obstruction related to the sigmoid mass. Normal appendix right lower quadrant. Vascular/Lymphatic: No significant vascular findings are present. No enlarged abdominal or pelvic lymph nodes. Reproductive: Uterus and bilateral adnexa are unremarkable. Other: No abdominal wall hernia or abnormality. No abdominopelvic ascites. Musculoskeletal: No acute or destructive bony lesions. Reconstructed images demonstrate no additional findings. IMPRESSION: 1.  Circumferential wall thickening proximal sigmoid colon concerning for malignancy. No change since prior study. 2. Significant fecal retention and colonic distention proximal to the sigmoid mass, consistent with an element of colonic obstruction. Electronically Signed   By: Randa Ngo M.D.   On: 01/29/2020 20:42   DG ABD ACUTE 2+V W 1V CHEST  Result Date: 02/03/2020 CLINICAL DATA:  Abdominal pain and vomiting, known sigmoid mass EXAM: DG ABDOMEN ACUTE W/ 1V CHEST COMPARISON:  CT from 01/29/2020 FINDINGS: Cardiac shadow is within normal limits. The lungs are well aerated bilaterally. No focal infiltrate or sizable effusion is seen. Abdomen demonstrates postsurgical changes. A relative paucity of bowel gas is noted consistent with the recent history. Mild free air is noted consistent with the recent surgical history. No abnormal mass or abnormal calcifications are noted. IMPRESSION: No definitive obstructive changes are seen. Postsurgical changes noted consistent with the given clinical history. Mild free air is noted although felt to be expected given the recent surgical history. Electronically Signed   By: Inez Catalina M.D.   On: 02/03/2020 11:18   DG C-Arm 1-60 Min-No Report  Result Date: 01/30/2020 Fluoroscopy was utilized by the requesting physician.  No radiographic interpretation.   [name] EKG SITE RITE  Result Date: 02/06/2020 If Site Rite image not attached, placement could not be confirmed due to current cardiac rhythm.   Labs:  CBC: Recent Labs    02/04/20 0455 02/05/20 0658 02/06/20 0428 02/07/20 0549  WBC 7.0 8.0 11.1* 11.3*  HGB 9.4* 9.9* 10.3* 9.0*  HCT 28.9* 29.4* 31.3* 27.1*  PLT 226 269 338 299    COAGS: Recent Labs    11/20/19 0941  INR 1.2  APTT 33    BMP: Recent Labs    02/04/20 0455 02/05/20 0658 02/06/20 0428 02/07/20 0549  NA 134* 134* 132* 134*  K 3.8 3.8 3.9 3.4*  CL 104 101 102 104  CO2 24 24 23 24   GLUCOSE 132* 127* 114* 142*  BUN 9 6 <5* <5*   CALCIUM 8.0* 8.0* 7.9* 7.7*  CREATININE 0.50 0.57 0.52 0.43*  GFRNONAA >60 >60 >60 >60  GFRAA >60 >60 >60 >60    LIVER FUNCTION TESTS: Recent Labs    11/22/19 0704 12/30/19 1753 01/29/20 2008 02/07/20 0549  BILITOT 0.8 0.6 0.6 0.5  AST 25 32 24 19  ALT 15 22 15 12   ALKPHOS 61 72 71 81  PROT 6.4* 7.1 8.0 6.0*  ALBUMIN 3.4* 3.9 4.3 2.1*    TUMOR MARKERS: No results for input(s): AFPTM, CEA, CA199, CHROMGRNA in the last 8760 hours.  Assessment and Plan:  Possible developing abscess after colectomy.  Will  proceed with image guided aspiration tomorrow by Dr. Kathlene Cote.   The iPad interpreter was used to discuss risks and benefits with the patient including bleeding, infection, damage to adjacent structures, bowel perforation/fistula connection, and sepsis.  All of the patient's questions were answered, patient is agreeable to proceed. Consent signed and in chart.  Thank you for this interesting consult.  I greatly enjoyed meeting Debra [name] and look forward to participating in their care.  A copy of this report was sent to the requesting provider on this date.  Electronically Signed: Murrell Redden, PA-C   02/07/2020, 4:05 PM      I spent a total of 40 Minutes in face to face in clinical consultation, greater than 50% of which was counseling/coordinating care for aspiration of abscess.

## 2020-02-07 NOTE — Consult Note (Signed)
PHARMACY - TOTAL PARENTERAL NUTRITION CONSULT NOTE   Indication:Prolonged Ileus  Patient Measurements: Height: 5' (152.4 cm) Weight: 88 kg (193 lb 14.4 oz) IBW/kg (Calculated) : 45.5 TPN AdjBW (KG): 55.6 Body mass index is 37.87 kg/m.  Assessment:  54 yo female with post-operative ileus. Patient was admitted with obstructing sigmoid colon mass and had open left hemicolectomy 5/24. She has PMH of Anemia, hypothyroidism, and recent COVID infection (March 2021)  Glucose / Insulin: Sensitive SSI- No Hx of Dm, BG 111-142 Electrolytes: K: 3.4, Phos: 2.0, Mg: 2.0 Renal: Scr: 0.43  LFTs / TGs:  Prealbumin / albumin:  Intake / Output; MIVF: NaCl @ 3ml/hr  GI Imaging: Surgeries / Procedures: s/p open left hemicolectomy with mobilization of the splenic flexure on 5/24    Central access: 5/31 TPN start date: 5/31  Nutritional Goals (per RD recommendation on 5/31): Clinimix E 5/20 at 83 mL/hr x 24 hrs + 20% ILE at 20 mL/hr x 12 hrs. Provides 2233 kcal, 100 grams of protein, 1992 mL fluid from Clinimix daily.  Current Nutrition:  NaCl @ 75ml/hr   Plan:  Increase Clinimix E 5/20 to 60 mL/hr x 24 hrs + 20% ILE at 20 mL/hr x 12 hrs at 1800 this evening. Decreasing IVF to 40ml/hr RD Recommending slow advancement due to risk for refeeding syndrome.  Add standard MVI and trace elements to TPN.  Thiamine 100mg  added to TPN x 3 days (5/31-6/2)  Phos: 2.0, K 3.4- KPhos 46mmol IV x 1 dose ordered.  Initiate Sensitive q6h SSI and adjust as needed  Reduce MIVF to 70 mL/hr at 1800  Monitor TPN labs on Mon/Thurs. Will monitor electrolytes daily x3 days.   [name], PharmD Clinical Pharmacist 02/07/2020 12:34 PM

## 2020-02-07 NOTE — Progress Notes (Addendum)
Nutrition Brief Follow-up  INTERVENTION:  Advance to Clinimix E 5/20 at 60 mL/hr x 24 hrs + 20% ILE at 20 mL/hr x 12 hrs.  Continue slow advancement of TPN in setting of refeeding syndrome. Patient's goal TPN regimen is Clinimix E 5/20 at 83 mL/hr x 24 hrs + 20% ILE at 20 mL/hr x 12 hrs. Provides 2233 kcal, 100 grams of protein, 1992 mL fluid from Clinimix daily.  Provide adult MVI and trace elements as daily TPN additives. Provide thiamine 100 mg daily x 3 days in TPN.  Continue to monitor magnesium, potassium, and phosphorus daily for at least 3 days, MD to replete as needed, as pt is at risk for refeeding syndrome given prolonged inadequate oral intake.  ASSESSMENT: TPN was initiated yesterday with Clinimix E 5/20 at 30 mL/hr x 24 hrs + 20% ILE at 20 mL/hr x 12 hrs. Plan is for slow advancement due to risk for refeeding syndrome.  IV Access: right cephalic double lumen PICC placed 5/31  Medications reviewed and include: Novolog 0-9 units Q6hrs, levothyroxine, Protonix, Zosyn, potassium phosphate 30 mmol IV once today, NS at 70 mL/hr.  Labs reviewed: CBG 111-135, Sodium 134, Potassium 3.4, BUN <5, Creatinine 0.43, Phosphorus 2. Triglycerides 115.  I/O: unmeasured UOP (5 occurrences yesterday); 475 mL output from NGT yesterday  Weight trend: 88 kg on 5/31; +2 kg from 5/24  Estimated Nutritional Needs: Kcal:  2100-2300 Protein:  105-120 grams Fluid:  2.1 L/day  Jacklynn Barnacle, MS, RD, LDN Pager number available on Amion

## 2020-02-07 NOTE — Progress Notes (Signed)
Physical Therapy Treatment Patient Details Name: Debra [name] MRN: [id number] DOB: 02/12/1966 Today's Date: 02/07/2020    History of Present Illness 54 year old female with history of hypothyroidism, iron deficient anemia, recent Covid infection in March.  Patient was also recently diagnosed with sigmoid mass, s/p surgery 5/24. Now on TPN and pending imaging to look for abscess    PT Comments    Pt is making gradual progress towards goals with ability to ambulate in hallway initially with IV pole and then further without IV pole. Reports she is feeling better. Will continue to progress as able.   Follow Up Recommendations  Home health PT;Supervision - Intermittent     Equipment Recommendations  None recommended by PT    Recommendations for Other Services       Precautions / Restrictions Precautions Precautions: Fall Restrictions Weight Bearing Restrictions: No    Mobility  Bed Mobility Overal bed mobility: Needs Assistance Bed Mobility: Sit to Supine;Supine to Sit     Supine to sit: Supervision     General bed mobility comments: slow technique but does manage transition without physical assist  Transfers Overall transfer level: Modified independent Equipment used: None             General transfer comment: safe technique with upright posture  Ambulation/Gait Ambulation/Gait assistance: Supervision Gait Distance (Feet): 200 Feet Assistive device: IV Pole Gait Pattern/deviations: Step-through pattern     General Gait Details: ambulated around RN station with slight unsteadiness noted during turns. Tends to demonstrate increased sway towards R side. No formal LOB noted. After ~100' able to ambulate without AD. Slow speed noted.    Stairs             Wheelchair Mobility    Modified Rankin (Stroke Patients Only)       Balance Overall balance assessment: Modified Independent                                           Cognition Arousal/Alertness: Awake/alert Behavior During Therapy: WFL for tasks assessed/performed Overall Cognitive Status: Within Functional Limits for tasks assessed                                        Exercises      General Comments        Pertinent Vitals/Pain Pain Assessment: No/denies pain    Home Living                      Prior Function            PT Goals (current goals can now be found in the care plan section) Acute Rehab PT Goals Patient Stated Goal: Go home ASAP PT Goal Formulation: With patient Time For Goal Achievement: 02/14/20 Potential to Achieve Goals: Good Progress towards PT goals: Progressing toward goals    Frequency    Min 2X/week      PT Plan Current plan remains appropriate    Co-evaluation              AM-PAC PT "6 Clicks" Mobility   Outcome Measure  Help needed turning from your back to your side while in a flat bed without using bedrails?: None Help needed moving from lying on your back to sitting on the side of  a flat bed without using bedrails?: None Help needed moving to and from a bed to a chair (including a wheelchair)?: None Help needed standing up from a chair using your arms (e.g., wheelchair or bedside chair)?: None Help needed to walk in hospital room?: A Little Help needed climbing 3-5 steps with a railing? : A Little 6 Click Score: 22    End of Session   Activity Tolerance: Patient tolerated treatment well Patient left: in chair Nurse Communication: Mobility status PT Visit Diagnosis: Muscle weakness (generalized) (M62.81);Difficulty in walking, not elsewhere classified (R26.2)     Time: [id number] PT Time Calculation (min) (ACUTE ONLY): 15 min  Charges:  $Gait Training: 8-22 mins                     Greggory Stallion, [place], DPT [phone]    Debra [name] 02/07/2020, 2:48 PM

## 2020-02-07 NOTE — Progress Notes (Signed)
A consult was placed to IV Therapy for more access;  Pt has a DL picc for TNA,  and fluids/meds; pt is on zosyn q 8 hours and Potassium phos daily;  Explained vein preservation to the RN, and requested that the 2 meds be staggered if possible;  RN will place new consult if staggering the meds doesn't help;  Thank you.

## 2020-02-07 NOTE — Progress Notes (Signed)
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  Patient had NG tube placed as well as a CT scan to evaluate for any potential complications after surgery.  CT scan showed no evidence of leak with intact anastomosis, but there may be a developing abscess.  She has remained afebrile for the past 24 hours, however her white blood cell count continues to be slightly elevated.  She denies any stool or flatus today, but says that she had some last night.  NG tube with 475 cc out.  PICC line placed yesterday with initiation of TPN.  Chest x-ray performed yesterday for productive cough did not show any acute pulmonary process.  Vital signs: Temp:  [98.7 F (37.1 C)-98.8 F (37.1 C)] 98.7 F (37.1 C) (06/01 0520) Pulse Rate:  [78-80] 78 (06/01 1151) Resp:  [18-24] 18 (06/01 1151) BP: (111-122)/(64-73) 119/73 (06/01 1151) SpO2:  [96 %-98 %] 98 % (06/01 1151)   Intake/Output: 05/31 0701 - 06/01 0700 In: 2560.1 [I.V.:1914.7; IV Piggyback:645.4] Out: 475 [Emesis/NG output:475] Last BM Date: 02/06/20  Physical Exam: Constitutional: No acute distress Abdomen: Soft, nondistended, with mild tenderness to palpation around the midline incision.  Labs:  Recent Labs    02/06/20 0428 02/07/20 0549  WBC 11.1* 11.3*  HGB 10.3* 9.0*  HCT 31.3* 27.1*  PLT 338 299   Recent Labs    02/06/20 0428 02/07/20 0549  NA 132* 134*  K 3.9 3.4*  CL 102 104  CO2 23 24  GLUCOSE 114* 142*  BUN <5* <5*  CREATININE 0.52 0.43*  CALCIUM 7.9* 7.7*   No results for input(s): LABPROT, INR in the last 72 hours.  Imaging: No results found.  Assessment/Plan: This is a 54 y.o. female s/p open left colectomy, now with ileus.  Suspect early signs of bowel function were due to post-obstructive diarrhea.   --Continue IV antibiotics --Ramp TPN up to goal --Repeat CT scan today to evaluate for abscess

## 2020-02-08 ENCOUNTER — Inpatient Hospital Stay: Payer: Self-pay | Admitting: Oncology

## 2020-02-08 ENCOUNTER — Inpatient Hospital Stay: Payer: Self-pay

## 2020-02-08 LAB — PHOSPHORUS: Phosphorus: 2.4 mg/dL — ABNORMAL LOW (ref 2.5–4.6)

## 2020-02-08 LAB — BASIC METABOLIC PANEL
Anion gap: 7 (ref 5–15)
BUN: 6 mg/dL (ref 6–20)
CO2: 25 mmol/L (ref 22–32)
Calcium: 7.7 mg/dL — ABNORMAL LOW (ref 8.9–10.3)
Chloride: 104 mmol/L (ref 98–111)
Creatinine, Ser: 0.41 mg/dL — ABNORMAL LOW (ref 0.44–1.00)
GFR calc Af Amer: >60 mL/min (ref >60)
GFR calc non Af Amer: >60 mL/min (ref >60)
Glucose, Bld: 155 mg/dL — ABNORMAL HIGH (ref 70–99)
Potassium: 3.4 mmol/L — ABNORMAL LOW (ref 3.5–5.1)
Sodium: 136 mmol/L (ref 135–145)

## 2020-02-08 LAB — GLUCOSE, CAPILLARY
Glucose-Capillary: 148 mg/dL — ABNORMAL HIGH (ref 70–99)
Glucose-Capillary: 152 mg/dL — ABNORMAL HIGH (ref 70–99)
Glucose-Capillary: 147 mg/dL — ABNORMAL HIGH (ref 70–99)
Glucose-Capillary: 128 mg/dL — ABNORMAL HIGH (ref 70–99)
Glucose-Capillary: 150 mg/dL — ABNORMAL HIGH (ref 70–99)

## 2020-02-08 LAB — CBC
HCT: 26.3 % — ABNORMAL LOW (ref 36.0–46.0)
Hemoglobin: 8.9 g/dL — ABNORMAL LOW (ref 12.0–15.0)
MCH: 27.8 pg (ref 26.0–34.0)
MCHC: 33.8 g/dL (ref 30.0–36.0)
MCV: 82.2 fL (ref 80.0–100.0)
Platelets: 329 10*3/uL (ref 150–400)
RBC: 3.2 MIL/uL — ABNORMAL LOW (ref 3.87–5.11)
RDW: 15 % (ref 11.5–15.5)
WBC: 12 10*3/uL — ABNORMAL HIGH (ref 4.0–10.5)
nRBC: 0 % (ref 0.0–0.2)

## 2020-02-08 LAB — MAGNESIUM: Magnesium: 2.1 mg/dL (ref 1.7–2.4)

## 2020-02-08 MED ORDER — FENTANYL CITRATE (PF) 100 MCG/2ML IJ SOLN
INTRAMUSCULAR | Status: DC | PRN
  Administered 2020-02-08 (×2): 50 ug via INTRAVENOUS

## 2020-02-08 MED ORDER — MIDAZOLAM HCL 2 MG/2ML IJ SOLN
INTRAMUSCULAR | Status: DC | PRN
  Administered 2020-02-08 (×2): 1 mg via INTRAVENOUS

## 2020-02-08 MED ORDER — POTASSIUM PHOSPHATES 15 MMOLE/5ML IV SOLN
20.0000 mmol | Freq: Once | INTRAVENOUS | Status: AC
  Administered 2020-02-08: 20 mmol via INTRAVENOUS
  Filled 2020-02-08: qty 6.67

## 2020-02-08 MED ORDER — SODIUM CHLORIDE 0.9 % IV SOLN: INTRAVENOUS | Status: DC

## 2020-02-08 MED ORDER — FENTANYL CITRATE (PF) 100 MCG/2ML IJ SOLN
INTRAMUSCULAR | Status: AC
  Filled 2020-02-08: qty 2

## 2020-02-08 MED ORDER — FAT EMULSION PLANT BASED 20 % IV EMUL
250.0000 mL | INTRAVENOUS | Status: AC
  Administered 2020-02-08: 250 mL via INTRAVENOUS
  Filled 2020-02-08: qty 250

## 2020-02-08 MED ORDER — TRACE MINERALS CU-MN-SE-ZN 300-55-60-3000 MCG/ML IV SOLN
INTRAVENOUS | Status: AC
  Filled 2020-02-08: qty 1992

## 2020-02-08 MED ORDER — MIDAZOLAM HCL 2 MG/2ML IJ SOLN
INTRAMUSCULAR | Status: AC
  Filled 2020-02-08: qty 2

## 2020-02-08 NOTE — Consult Note (Signed)
PHARMACY - TOTAL PARENTERAL NUTRITION CONSULT NOTE   Indication:Prolonged Ileus  Patient Measurements: Height: 5' (152.4 cm) Weight: 88 kg (193 lb 14.4 oz) IBW/kg (Calculated) : 45.5 TPN AdjBW (KG): 55.6 Body mass index is 37.87 kg/m.  Assessment:  54 yo female with post-operative ileus. Patient was admitted with obstructing sigmoid colon mass and had open left hemicolectomy 5/24. She has PMH of Anemia, hypothyroidism, and recent COVID infection (March 2021)  Glucose / Insulin: Sensitive SSI- No Hx of Dm, BG 108 - 137 Electrolytes: potassium and phosphorous are low but borderline Renal: Scr: <1, stable  LFTs / TGs: LFTs wnl, TG 115 at baseline Prealbumin / albumin:  Intake / Output; MIVF: NaCl @ 56ml/hr  GI Imaging: 6/1 CT shows possible developing abscess which was cultured Surgeries / Procedures: s/p open left hemicolectomy with mobilization of the splenic flexure on 5/24    Central access: 5/31 TPN start date: 5/31  Nutritional Goals (per RD recommendation on 5/31): Clinimix E 5/20 at 83 mL/hr x 24 hrs + 20% ILE at 20 mL/hr x 12 hrs. Provides 2233 kcal, 100 grams of protein, 1992 mL fluid from Clinimix daily.  Current Nutrition:  NPO  Plan:   advance Clinimix E 5/20 to goal rate of 83 mL/hr x 24 hrs + 20% ILE at 20 mL/hr x 12 hrs at 1800 this evening  decrease MIVF to 41ml/hr  Add standard MVI and trace elements to TPN, thiamine 100mg  added to TPN x 3 days (today is the final day)  KPhos 42mmol IV x 1   continue Sensitive q6h SSI and adjust as needed   Monitor TPN labs on Mon/Thurs or more often if clinically indicated  Dallie Piles, PharmD Clinical Pharmacist 02/08/2020 6:59 AM

## 2020-02-08 NOTE — Progress Notes (Signed)
Patient clinically stable post aspiration of abdominal fluid per Dr Kathlene Cote, tolerated procedure well. Jackie/interpreter present for entire procedure. Received Versed 2mg  along with Fentanyl 100 mcg IV for procedure. Denies complaints post procedure. Awakens readily to spoken voice and follows commands, dozing at intervals with vitals stable. Report given to care nurse upon arrival back to 226 with questions answered. bandade dressing dry and intact. To right abdomen.

## 2020-02-08 NOTE — Procedures (Signed)
Interventional Radiology Procedure Note  Procedure: US guided aspiration of abdominal fluid  Complications: None  Estimated Blood Loss: < 10 mL  Findings: Fluid just inferior to right lobe of liver aspirated under US guidance yielding 40 mL of clear, orange fluid. Sample sent for culture analysis.  Venetia Night. Kathlene Cote, M.D Pager:  [phone]

## 2020-02-08 NOTE — Progress Notes (Addendum)
[place] [facility] Day(s): 10.   Post op day(s): 9 Days Post-Op.   Interval History:  Patient seen and examined no acute events or new complaints overnight.  Patient reports she is feeling good No nausea or emesis CBC pending Mild hypokalemia to 3.4, o/w no electrolyte derangement  Renal function normal NGT output 250 ccs She is having liquid bowel movements  Plan for US guided aspiration of intra-abdominal fluid collection   Vital signs in last 24 hours: [min-max] current  Temp:  [98.6 F (37 C)-99.1 F (37.3 C)] 98.6 F (37 C) (06/02 0510) Pulse Rate:  [79-80] 79 (06/02 0510) Resp:  [20] 20 (06/02 0510) BP: (106-117)/(63-68) 106/68 (06/02 0510) SpO2:  [96 %-97 %] 96 % (06/02 0510)     Height: 5' (152.4 cm) Weight: 88 kg BMI (Calculated): 37.87   Intake/Output last 2 shifts:  06/01 0701 - 06/02 0700 In: 3544.8 [I.V.:2784.9; IV Piggyback:759.9] Out: 250 [Emesis/NG output:250]   Physical Exam:  Constitutional: alert, cooperative and no distress  Respiratory: breathing non-labored at rest  Cardiovascular: regular rate and sinus rhythm  Gastrointestinal: Soft, non-tender, and non-distended Integumentary: There is erythema and warmth surrounding the medial and inferior portion of her wound, staples were removed in this area and revealed gross purulence, fascia is intact   Labs:  CBC Latest Ref Rng & Units 02/07/2020 02/06/2020 02/05/2020  WBC 4.0 - 10.5 K/uL 11.3(H) 11.1(H) 8.0  Hemoglobin 12.0 - 15.0 g/dL 9.0(L) 10.3(L) 9.9(L)  Hematocrit 36.0 - 46.0 % 27.1(L) 31.3(L) 29.4(L)  Platelets 150 - 400 K/uL 299 338 269   CMP Latest Ref Rng & Units 02/08/2020 02/07/2020 02/06/2020  Glucose 70 - 99 mg/dL 155(H) 142(H) 114(H)  BUN 6 - 20 mg/dL 6 <5(L) <5(L)  Creatinine 0.44 - 1.00 mg/dL 0.41(L) 0.43(L) 0.52  Sodium 135 - 145 mmol/L 136 134(L) 132(L)  Potassium 3.5 - 5.1 mmol/L 3.4(L) 3.4(L) 3.9  Chloride 98 - 111 mmol/L 104 104 102  CO2 22  - 32 mmol/L 25 24 23   Calcium 8.9 - 10.3 mg/dL 7.7(L) 7.7(L) 7.9(L)  Total Protein 6.5 - 8.1 g/dL - 6.0(L) -  Total Bilirubin 0.3 - 1.2 mg/dL - 0.5 -  Alkaline Phos 38 - 126 U/L - 81 -  AST 15 - 41 U/L - 19 -  ALT 0 - 44 U/L - 12 -     Imaging studies: No new pertinent imaging studies   Assessment/Plan:  54 y.o. female with post-operative wound infection and clinically improving ileus 9 Days Post-Op s/p open left hemicolectomy with mobilization of the splenic flexure for obstructing sigmoid adenocarcinoma.   - Removed staples to inferior half of the wound, packed with saline moistened Kerlix, cover with ABD, secure with tape. Continue dressing changes BID. Once improved we can consider vac for home.   - Continue NGT decompression to LIS for today; monitor output; reassess need tomorrow   - continue with plan for US guided aspiration to rule out intra-abdominal process/abscess   - NPO + TPN   - Continue IV ABx (Zosyn); follow up wound culture obtained today  - Pain control prn; antiemetics prn  - monitor abdominal examination; on-going bowel function  - monitor labs; CBC pending  - mobilization encouraged  All of the above findings and recommendations were discussed with the patient, patient's family, and the medical team, and all of patient's and family's questions were answered to their expressed satisfaction.  -- [name], PA-C [facility] 02/08/2020, 11:56 AM  [phone] M-F: 7am - 4pm  I saw and evaluated the patient.  I agree with the above documentation, exam, and plan, which I have edited where appropriate. [name]  12:53 PM

## 2020-02-08 NOTE — Progress Notes (Signed)
Nutrition Brief Follow-up  INTERVENTION:  Advance to Clinimix E 5/20 at 83 mL/hr x 24 hrs + 20% ILE at 20 mL/hr x 12 hrs. Provides 2233 kcal, 100 grams of protein, 1992 mL fluid from Clinimix daily.  Provide adult MVI and trace elements as daily TPN additives. Provide thiamine 100 mg daily x 3 days in TPN.  Continue to monitor magnesium, potassium, and phosphorus daily, MD to replete as needed, as pt is at risk for refeeding syndrome givenprolonged inadequate oral intake.  ASSESSMENT: TPN was initiated 5/31 with Clinimix E 5/20 at 30 mL/hr x 24 hrs + 20% ILE at 20 mL/hr x 12 hrs. Yesterday TPN was advanced to Clinimix E 5/20 at 60 mL/hr x 24 hrs + 20% ILE at 20 ml/hr x 12 hrs.  IV Access: right cephalic double lumen PICC placed 5/31  Medications reviewed and include: Novolog 0-9 units Q6hrs, levothyroxine, Protonix, Zosyn, NS at 40 mL/hr.  Labs reviewed: CBG 108-152, Potassium 3.4, Creatinine 0.41, Phosphorus 2.4.  I/O: unmeasured UOP (4 occurrences yesterday); 250 mL output from NGT yesterday  No weight to trend since 5/31  Estimated Nutritional Needs: Kcal:  2100-2300 Protein:  105-120 grams Fluid:  2.1 L/day  Jacklynn Barnacle, MS, RD, LDN Pager number available on Amion

## 2020-02-09 LAB — COMPREHENSIVE METABOLIC PANEL
ALT: 14 U/L (ref 0–44)
AST: 22 U/L (ref 15–41)
Albumin: 2 g/dL — ABNORMAL LOW (ref 3.5–5.0)
Alkaline Phosphatase: 84 U/L (ref 38–126)
Anion gap: 6 (ref 5–15)
BUN: 8 mg/dL (ref 6–20)
CO2: 25 mmol/L (ref 22–32)
Calcium: 7.7 mg/dL — ABNORMAL LOW (ref 8.9–10.3)
Chloride: 102 mmol/L (ref 98–111)
Creatinine, Ser: 0.34 mg/dL — ABNORMAL LOW (ref 0.44–1.00)
GFR calc Af Amer: >60 mL/min (ref >60)
GFR calc non Af Amer: >60 mL/min (ref >60)
Glucose, Bld: 168 mg/dL — ABNORMAL HIGH (ref 70–99)
Potassium: 3.7 mmol/L (ref 3.5–5.1)
Sodium: 133 mmol/L — ABNORMAL LOW (ref 135–145)
Total Bilirubin: 0.3 mg/dL (ref 0.3–1.2)
Total Protein: 6 g/dL — ABNORMAL LOW (ref 6.5–8.1)

## 2020-02-09 LAB — CBC
HCT: 27.8 % — ABNORMAL LOW (ref 36.0–46.0)
Hemoglobin: 9.1 g/dL — ABNORMAL LOW (ref 12.0–15.0)
MCH: 27.6 pg (ref 26.0–34.0)
MCHC: 32.7 g/dL (ref 30.0–36.0)
MCV: 84.2 fL (ref 80.0–100.0)
Platelets: 370 10*3/uL (ref 150–400)
RBC: 3.3 MIL/uL — ABNORMAL LOW (ref 3.87–5.11)
RDW: 15.2 % (ref 11.5–15.5)
WBC: 11.3 10*3/uL — ABNORMAL HIGH (ref 4.0–10.5)
nRBC: 0 % (ref 0.0–0.2)

## 2020-02-09 LAB — GLUCOSE, CAPILLARY
Glucose-Capillary: 150 mg/dL — ABNORMAL HIGH (ref 70–99)
Glucose-Capillary: 162 mg/dL — ABNORMAL HIGH (ref 70–99)
Glucose-Capillary: 82 mg/dL (ref 70–99)

## 2020-02-09 LAB — MAGNESIUM: Magnesium: 2 mg/dL (ref 1.7–2.4)

## 2020-02-09 LAB — PHOSPHORUS: Phosphorus: 2.3 mg/dL — ABNORMAL LOW (ref 2.5–4.6)

## 2020-02-09 MED ORDER — FAT EMULSION PLANT BASED 20 % IV EMUL
250.0000 mL | INTRAVENOUS | Status: AC
  Administered 2020-02-09: 250 mL via INTRAVENOUS
  Filled 2020-02-09: qty 250

## 2020-02-09 MED ORDER — TRACE MINERALS CU-MN-SE-ZN 300-55-60-3000 MCG/ML IV SOLN
INTRAVENOUS | Status: AC
  Filled 2020-02-09: qty 1992

## 2020-02-09 NOTE — Progress Notes (Signed)
PT Cancellation Note  Patient Details Name: Debra [name] MRN: [id number] DOB: 09/20/1965   Cancelled Treatment:    Reason Eval/Treat Not Completed: Other (comment)   Offered session x 2 today.  In AM pt requested to walk in PM.  Daughter in room and communicated with pt.   PM attempt resulted in her requested to come back later.  Ipad interpreter used in PM session [phone] [place] as son in room and unable to speak [place].  RN stated pt was up walking earlier today with staff with no difficulties.  Will continue to encourage mobility.   Chesley Noon 02/09/2020, 2:57 PM

## 2020-02-09 NOTE — Consult Note (Signed)
PHARMACY - TOTAL PARENTERAL NUTRITION CONSULT NOTE   Indication:Prolonged Ileus  Patient Measurements: Height: 5' (152.4 cm) Weight: 88 kg (193 lb 14.4 oz) IBW/kg (Calculated) : 45.5 TPN AdjBW (KG): 55.6 Body mass index is 37.87 kg/m.  Assessment:  54 yo female with post-operative ileus. Patient was admitted with obstructing sigmoid colon mass and had open left hemicolectomy 5/24. She has PMH of Anemia, hypothyroidism, and recent COVID infection (March 2021)  Glucose / Insulin: Sensitive SSI- No Hx of Dm, BG 128 - 152, 4 units SSI / previous 24h Electrolytes: sodium and phosphorous are low but borderline Renal: Scr: <1, stable  LFTs / TGs: LFTs wnl, TG 115 at baseline Prealbumin / albumin:  Intake / Output; MIVF: NaCl @ 74ml/hr  GI Imaging: 6/1 CT shows possible developing abscess which was cultured Surgeries / Procedures: s/p open left hemicolectomy with mobilization of the splenic flexure on 5/24    Central access: 5/31 TPN start date: 5/31  Nutritional Goals (per RD recommendation on 5/31): Clinimix E 5/20 at 83 mL/hr x 24 hrs + 20% ILE at 20 mL/hr x 12 hrs. Provides 2233 kcal, 100 grams of protein, 1992 mL fluid from Clinimix daily.  Current Nutrition:  NPO  Plan:   continue Clinimix E 5/20 at goal rate of 83 mL/hr x 24 hrs + 20% ILE at 20 mL/hr x 12 hrs   continue MIVF at 67ml/hr  Add standard MVI and trace elements to TPN, sodium phosphate 20 mmol  continue Sensitive q6h SSI and adjust as needed   Monitor TPN labs on Mon/Thurs or more often if clinically indicated  RFP  f/u in am 6/4  Dallie Piles, PharmD Clinical Pharmacist 02/09/2020 9:41 AM

## 2020-02-09 NOTE — Progress Notes (Signed)
Per Zack PA okay for RN to remove NGT and start clear liquids diet.

## 2020-02-09 NOTE — Progress Notes (Addendum)
[place] Hospital Day(s): 11.   Post op day(s): 10 Days Post-Op.   Interval History:  Patient seen and examined no acute events or new complaints overnight.  Patient reports she is feeling better this morning No nausea or emesis CBC pending Labs are otherwise reassuring, no electrolyte derangement aside from mild hypophosphatemia  NGT with 425 ccs out She did have 2 BMs recorded yesterday Underwent US guided aspiration as well, yielded what was described as serosanguinous fluid; cultures pending   Vital signs in last 24 hours: [min-max] current  Temp:  [98.2 F (36.8 C)-98.6 F (37 C)] 98.2 F (36.8 C) (06/03 0434) Pulse Rate:  [75-97] 97 (06/03 0434) Resp:  [16-24] 20 (06/03 0434) BP: (99-119)/(61-71) 104/71 (06/03 0434) SpO2:  [92 %-100 %] 96 % (06/03 0434)     Height: 5' (152.4 cm) Weight: 88 kg BMI (Calculated): 37.87   Intake/Output last 2 shifts:  06/02 0701 - 06/03 0700 In: 2589.7 [I.V.:2027.2; IV Piggyback:562.5] Out: 425 [Emesis/NG output:425]   Physical Exam:  Constitutional: alert, cooperative and no distress  Respiratory: breathing non-labored at rest  Cardiovascular: regular rate and sinus rhythm  Gastrointestinal: Soft, non-tender, and non-distended Integumentary: Open wound to inferior portion of midline laparotomy incision appears improved, surrounding erythema resolving, no purulence  Labs:  CBC Latest Ref Rng & Units 02/08/2020 02/07/2020 02/06/2020  WBC 4.0 - 10.5 K/uL 12.0(H) 11.3(H) 11.1(H)  Hemoglobin 12.0 - 15.0 g/dL 8.9(L) 9.0(L) 10.3(L)  Hematocrit 36.0 - 46.0 % 26.3(L) 27.1(L) 31.3(L)  Platelets 150 - 400 K/uL 329 299 338   CMP Latest Ref Rng & Units 02/09/2020 02/08/2020 02/07/2020  Glucose 70 - 99 mg/dL 168(H) 155(H) 142(H)  BUN 6 - 20 mg/dL 8 6 <5(L)  Creatinine 0.44 - 1.00 mg/dL 0.34(L) 0.41(L) 0.43(L)  Sodium 135 - 145 mmol/L 133(L) 136 134(L)  Potassium 3.5 - 5.1 mmol/L 3.7 3.4(L) 3.4(L)  Chloride  98 - 111 mmol/L 102 104 104  CO2 22 - 32 mmol/L 25 25 24   Calcium 8.9 - 10.3 mg/dL 7.7(L) 7.7(L) 7.7(L)  Total Protein 6.5 - 8.1 g/dL 6.0(L) - 6.0(L)  Total Bilirubin 0.3 - 1.2 mg/dL 0.3 - 0.5  Alkaline Phos 38 - 126 U/L 84 - 81  AST 15 - 41 U/L 22 - 19  ALT 0 - 44 U/L 14 - 12     Imaging studies: No new pertinent imaging studies   Assessment/Plan:  54 y.o. female with improving post-operative midline wound infection and post-surgical ileus 10 Days Post-Op s/p open left hemicolectomy with mobilization of the splenic flexure for obstructing sigmoid adenocarcinoma.   - NPO + TPN   - Will preform clamping trial of NGT; x4 hours; check residuals             - Continue IV ABx (Zosyn); follow up wound cultures             - Pain control prn; antiemetics prn             - monitor abdominal examination; on-going bowel function  - Wound Care: pack with saline moistened Kerlix, cover with ABD, secure with tape. Continue dressing changes BID. Once improved we can consider vac for home.              - monitor labs; CBC pending             - mobilization encouraged    All of the above findings and recommendations were discussed with the patient, patient's  family (daughter), and the medical team, and all of patient's and family's questions were answered to their expressed satisfaction.  -- [name], PA-C [facility] 02/09/2020, 7:30 AM [phone] M-F: 7am - 4pm  I saw and evaluated the patient.  I agree with the above documentation, exam, and plan, which I have edited where appropriate. [name]  9:04 AM

## 2020-02-10 LAB — RENAL FUNCTION PANEL
Albumin: 2 g/dL — ABNORMAL LOW (ref 3.5–5.0)
Anion gap: 6 (ref 5–15)
BUN: 11 mg/dL (ref 6–20)
CO2: 27 mmol/L (ref 22–32)
Calcium: 7.9 mg/dL — ABNORMAL LOW (ref 8.9–10.3)
Chloride: 102 mmol/L (ref 98–111)
Creatinine, Ser: 0.5 mg/dL (ref 0.44–1.00)
GFR calc Af Amer: >60 mL/min (ref >60)
GFR calc non Af Amer: >60 mL/min (ref >60)
Glucose, Bld: 155 mg/dL — ABNORMAL HIGH (ref 70–99)
Phosphorus: 3.2 mg/dL (ref 2.5–4.6)
Potassium: 3.4 mmol/L — ABNORMAL LOW (ref 3.5–5.1)
Sodium: 135 mmol/L (ref 135–145)

## 2020-02-10 LAB — GLUCOSE, CAPILLARY
Glucose-Capillary: 152 mg/dL — ABNORMAL HIGH (ref 70–99)
Glucose-Capillary: 154 mg/dL — ABNORMAL HIGH (ref 70–99)
Glucose-Capillary: 159 mg/dL — ABNORMAL HIGH (ref 70–99)
Glucose-Capillary: 171 mg/dL — ABNORMAL HIGH (ref 70–99)

## 2020-02-10 LAB — CBC
HCT: 25.7 % — ABNORMAL LOW (ref 36.0–46.0)
Hemoglobin: 8.5 g/dL — ABNORMAL LOW (ref 12.0–15.0)
MCH: 28.1 pg (ref 26.0–34.0)
MCHC: 33.1 g/dL (ref 30.0–36.0)
MCV: 84.8 fL (ref 80.0–100.0)
Platelets: 392 10*3/uL (ref 150–400)
RBC: 3.03 MIL/uL — ABNORMAL LOW (ref 3.87–5.11)
RDW: 15.1 % (ref 11.5–15.5)
WBC: 10.4 10*3/uL (ref 4.0–10.5)
nRBC: 0 % (ref 0.0–0.2)

## 2020-02-10 LAB — AEROBIC/ANAEROBIC CULTURE W GRAM STAIN (SURGICAL/DEEP WOUND)

## 2020-02-10 MED ORDER — TRACE MINERALS CU-MN-SE-ZN 300-55-60-3000 MCG/ML IV SOLN
INTRAVENOUS | Status: AC
  Filled 2020-02-10: qty 960

## 2020-02-10 MED ORDER — ENSURE ENLIVE PO LIQD
237.0000 mL | Freq: Two times a day (BID) | ORAL | Status: DC
  Administered 2020-02-10 – 2020-02-14 (×8): 237 mL via ORAL

## 2020-02-10 MED ORDER — FAT EMULSION PLANT BASED 20 % IV EMUL
250.0000 mL | INTRAVENOUS | Status: AC
  Administered 2020-02-10: 250 mL via INTRAVENOUS
  Filled 2020-02-10: qty 250

## 2020-02-10 NOTE — Progress Notes (Signed)
Nutrition Follow Up Note   DOCUMENTATION CODES:   Obesity unspecified  INTERVENTION:   Decrease Clinimix E 5/20 to 49m/hr x 24 hrs + 20% ILE at 20 mL/hr x 12 hrs.   Add Ensure Enlive po BID, each supplement provides 350 kcal and 20 grams of protein  NUTRITION DIAGNOSIS:   Increased nutrient needs related to catabolic illness(obstructing sigmoid colon mass (invasive adenocarcinoma) s/p left hemicolectomy, recent COVID-19) as evidenced by increased estimated needs.  GOAL:   Patient will meet greater than or equal to 90% of their needs -met with TPN, progressing with diet advancement   MONITOR:   PO intake, Supplement acceptance, Labs, Weight trends, Skin, I & O's, TPN  ASSESSMENT:   54year old female with PMHx of hypothyroidism, recent COVID-19 11/20/2019 who was admitted with obstructing sigmoid colon mass s/p open left hemicolectomy with mobilization of the splenic flexure on 5/24 (invasive adenocarcinoma, poorly differentiated per surgical pathology), now with post-operative ileus.   Pt tolerating TPN well at goal rate. Refeed labs stabilizing. NGT removed yesterday and patient has tolerated a clear liquid diet. Pt advanced to full liquids today; RD will add Ensure supplements. Pt is mobilizing. Pt is having loose stools. No nausea or vomiting. Will begin to wean TPN this evening if patient tolerates diet and hopefully can discontinue tomorrow. Pt ate 50% of her breakfast this morning. Per chart, pt has remained stable since admit.   Medications reviewed and include: lovenox, insulin, synthroid, protonix, NaCl _0 /hr, zosyn    Labs reviewed: K 3.4(L), P 3.2 wnl, Mg 2.0 wnl Triglycerides 115- 6/1 Hgb 8.5(L), Hct 25.7(L) cbgs- 159, 154, 171 x 24 hrs  Diet Order:   Diet Order            Diet full liquid Room service appropriate? Yes; Fluid consistency: Thin  Diet effective now             EDUCATION NEEDS:   No education needs have been identified at this  time  Skin:  Skin Assessment: Skin Integrity Issues:(stage 2 left buttocks; closed incision to abdomen), VAC  Last BM:  6/3- type 6  Height:   Ht Readings from Last 1 Encounters:  01/30/20 5' (1.524 m)   Weight:   Wt Readings from Last 1 Encounters:  02/10/20 85.9 kg   Ideal Body Weight:  45.5 kg  BMI:  Body mass index is 36.98 kg/m.  Estimated Nutritional Needs:   Kcal:  2100-2300  Protein:  105-120 grams  Fluid:  2.1 L/day  CKoleen DistanceMS, RD, LDN Please refer to [facility]at[facility]or RD and/or RD on-call/weekend/after hours pager

## 2020-02-10 NOTE — Consult Note (Signed)
PHARMACY - TOTAL PARENTERAL NUTRITION CONSULT NOTE   Indication:Prolonged Ileus  Patient Measurements: Height: 5' (152.4 cm) Weight: 85.9 kg (189 lb 6 oz) IBW/kg (Calculated) : 45.5 TPN AdjBW (KG): 55.6 Body mass index is 36.98 kg/m.  Assessment:  54 yo female with post-operative ileus. Patient was admitted with obstructing sigmoid colon mass and had open left hemicolectomy 5/24. She has PMH of Anemia, hypothyroidism, and recent COVID infection (March 2021)  Glucose / Insulin: Sensitive SSI- No Hx of Dm, BG 82 - 171, 6 units SSI / previous 24h Electrolytes: sodium on lower end of normal Renal: Scr: <1, stable  LFTs / TGs: LFTs wnl, TG 115 at baseline Prealbumin / albumin:  Intake / Output; MIVF: NaCl @ 26ml/hr  GI Imaging: 6/1 CT shows possible developing abscess which was cultured Surgeries / Procedures: s/p open left hemicolectomy with mobilization of the splenic flexure on 5/24    Central access: 5/31 TPN start date: 5/31  Nutritional Goals (per RD recommendation on 5/31): Clinimix E 5/20 at 83 mL/hr x 24 hrs + 20% ILE at 20 mL/hr x 12 hrs. Provides 2233 kcal, 100 grams of protein, 1992 mL fluid from Clinimix daily.  Current Nutrition:  NPO  Plan:   Decrease/Wean Clinimix E 5/20 to 40 mL/hr x 24 hrs + 20% ILE at 20 mL/hr x 12 hrs   continue MIVF at 57ml/hr  Add standard MVI and trace elements to TPN, sodium phosphate 20 mmol as patient is at risk for refeeding.  continue Sensitive q6h SSI and adjust as needed   Monitor TPN labs on Mon/Thurs or more often if clinically indicated  RFP  f/u in am 6/5  [name], PharmD Clinical Pharmacist 02/10/2020 12:04 PM

## 2020-02-10 NOTE — Progress Notes (Signed)
PT Cancellation Note  Patient Details Name: Debra [name] MRN: [id number] DOB: [birth year]   Cancelled Treatment:    Reason Eval/Treat Not Completed: Medical issues which prohibited therapy   Chart reviewed.  BP 83/53.  Will hold session this pm due to low BP.     Chesley Noon 02/10/2020, 3:39 PM

## 2020-02-10 NOTE — TOC Progression Note (Signed)
Transition of Care [facility]) - Progression Note    Patient Details  Name: Debra [name] MRN: [id number] Date of Birth: Jan 15, 1966  Transition of Care [facility]) CM/SW Contact  Beverly Sessions, RN Phone Number: 02/10/2020, 10:11 AM  Clinical Narrative:    Catha Gosselin with [employer] that patient would require RN for wound care at discharge.  Per MD note may change to wound vac prior to discharge  Zack with [employer] notified of potential need for charity vac at discharge    Expected Discharge Plan: [place] Barriers to Discharge: Continued Medical Work up  Expected Discharge Plan and Services Expected Discharge Plan: [place]   Discharge Planning Services: CM Consult   Living arrangements for the past 2 months: [place]: PT [place]: [place] ([place]) Date [place]: 02/02/20   Representative spoke with at [place] ([place]) Interventions    Readmission Risk Interventions No flowsheet data found.

## 2020-02-10 NOTE — Progress Notes (Addendum)
[place] Hospital Day(s): 12.   Post op day(s): 11 Days Post-Op.   Interval History:  Patient seen and examined no acute events or new complaints overnight.  Patient reports she is feeling good No nausea or emesis Leukocytosis improved yesterday to 11K; CBC pending today. No fevers Renal function remains normal, sCr - 0.50, good UO at 2.5L Mild hypokalemia to 3.4 NGT removed yesterday afternoon CLD initiated; tolerating well Multiple BMs recorded Ambulating without issues   Vital signs in last 24 hours: [min-max] current  Temp:  [98.1 F (36.7 C)-98.4 F (36.9 C)] 98.4 F (36.9 C) (06/04 0509) Pulse Rate:  [73-93] 80 (06/04 0509) Resp:  [16-20] 16 (06/04 0509) BP: (90-108)/(56-69) 93/62 (06/04 0509) SpO2:  [93 %-98 %] 93 % (06/04 0509) Weight:  [85.9 kg] 85.9 kg (06/04 0500)     Height: 5' (152.4 cm) Weight: 85.9 kg BMI (Calculated): 36.98   Intake/Output last 2 shifts:  06/03 0701 - 06/04 0700 In: 2322.3 [I.V.:2180.8; IV Piggyback:141.5] Out: 2550 [Urine:2550]   Physical Exam:  Constitutional: alert, cooperative and no distress  Respiratory: breathing non-labored at rest  Cardiovascular: regular rate and sinus rhythm  Gastrointestinal: Soft, non-tender, and non-distended Integumentary: Open wound to inferior portion of midline laparotomy incision appears improved, surrounding erythema resolving, no purulence  Labs:  CBC Latest Ref Rng & Units 02/09/2020 02/08/2020 02/07/2020  WBC 4.0 - 10.5 K/uL 11.3(H) 12.0(H) 11.3(H)  Hemoglobin 12.0 - 15.0 g/dL 9.1(L) 8.9(L) 9.0(L)  Hematocrit 36.0 - 46.0 % 27.8(L) 26.3(L) 27.1(L)  Platelets 150 - 400 K/uL 370 329 299   CMP Latest Ref Rng & Units 02/10/2020 02/09/2020 02/08/2020  Glucose 70 - 99 mg/dL 155(H) 168(H) 155(H)  BUN 6 - 20 mg/dL 11 8 6   Creatinine 0.44 - 1.00 mg/dL 0.50 0.34(L) 0.41(L)  Sodium 135 - 145 mmol/L 135 133(L) 136  Potassium 3.5 - 5.1 mmol/L 3.4(L) 3.7 3.4(L)  Chloride  98 - 111 mmol/L 102 102 104  CO2 22 - 32 mmol/L 27 25 25   Calcium 8.9 - 10.3 mg/dL 7.9(L) 7.7(L) 7.7(L)  Total Protein 6.5 - 8.1 g/dL - 6.0(L) -  Total Bilirubin 0.3 - 1.2 mg/dL - 0.3 -  Alkaline Phos 38 - 126 U/L - 84 -  AST 15 - 41 U/L - 22 -  ALT 0 - 44 U/L - 14 -     Imaging studies: No new pertinent imaging studies   Assessment/Plan:  54 y.o. female overall doing well 11 Days Post-Op s/p open left hemicolectomy with mobilization of the splenic flexure for obstructing sigmoid adenocarcinoma.   - Advance to full liquid diet  - Continue TPN for today; will plan to wean if tolerates diet.  - Continue IV ABx (Zosyn); follow up wound cultures --> growing rare enterococcus faecalis; susceptibilities pending              - Pain control prn; antiemetics prn             - monitor abdominal examination; on-going bowel function             - Wound Care: pack with saline moistened Kerlix, cover with ABD, secure with tape. Continue dressing changes BID. Once improved we can consider vac for home.              - monitor labs; CBC pending             - mobilization encouraged      - Discharge Planning: pending  diet advancement and arrangement home vac (orders placed 06/03); hopefully in next 48 hours  All of the above findings and recommendations were discussed with the patient, and the medical team, and all of patient's questions were answered to her expressed satisfaction.  -- [name], PA-C [facility] 02/10/2020, 7:15 AM [phone] M-F: 7am - 4pm  I saw and evaluated the patient.  I agree with the above documentation, exam, and plan, which I have edited where appropriate. [name]  9:41 AM

## 2020-02-10 NOTE — Plan of Care (Signed)
Continuing with plan of care. 

## 2020-02-11 LAB — RENAL FUNCTION PANEL
Albumin: 2.3 g/dL — ABNORMAL LOW (ref 3.5–5.0)
Anion gap: 8 (ref 5–15)
BUN: 12 mg/dL (ref 6–20)
CO2: 27 mmol/L (ref 22–32)
Calcium: 8.1 mg/dL — ABNORMAL LOW (ref 8.9–10.3)
Chloride: 99 mmol/L (ref 98–111)
Creatinine, Ser: 0.59 mg/dL (ref 0.44–1.00)
GFR calc Af Amer: >60 mL/min (ref >60)
GFR calc non Af Amer: >60 mL/min (ref >60)
Glucose, Bld: 141 mg/dL — ABNORMAL HIGH (ref 70–99)
Phosphorus: 3.6 mg/dL (ref 2.5–4.6)
Potassium: 4 mmol/L (ref 3.5–5.1)
Sodium: 134 mmol/L — ABNORMAL LOW (ref 135–145)

## 2020-02-11 LAB — GLUCOSE, CAPILLARY
Glucose-Capillary: 120 mg/dL — ABNORMAL HIGH (ref 70–99)
Glucose-Capillary: 125 mg/dL — ABNORMAL HIGH (ref 70–99)
Glucose-Capillary: 128 mg/dL — ABNORMAL HIGH (ref 70–99)
Glucose-Capillary: 134 mg/dL — ABNORMAL HIGH (ref 70–99)
Glucose-Capillary: 145 mg/dL — ABNORMAL HIGH (ref 70–99)

## 2020-02-11 MED ORDER — FAT EMULSION PLANT BASED 20 % IV EMUL
250.0000 mL | INTRAVENOUS | Status: AC
  Administered 2020-02-11: 250 mL via INTRAVENOUS
  Filled 2020-02-11: qty 250

## 2020-02-11 MED ORDER — TRACE MINERALS CU-MN-SE-ZN 300-55-60-3000 MCG/ML IV SOLN
INTRAVENOUS | Status: AC
  Filled 2020-02-11: qty 960

## 2020-02-11 NOTE — Consult Note (Addendum)
PHARMACY - TOTAL PARENTERAL NUTRITION CONSULT NOTE   Indication:Prolonged Ileus  Patient Measurements: Height: 5' (152.4 cm) Weight: 85.9 kg (189 lb 6 oz) IBW/kg (Calculated) : 45.5 TPN AdjBW (KG): 55.6 Body mass index is 36.98 kg/m.  Assessment:  54 yo female with post-operative ileus. Patient was admitted with obstructing sigmoid colon mass and had open left hemicolectomy 5/24. She has PMH of Anemia, hypothyroidism, and recent COVID infection (March 2021)  Glucose / Insulin: Sensitive SSI- No Hx of Dm, BG 134-171, 5 units SSI / previous 24h Electrolytes: sodium on lower end of normal Renal: Scr: <1, stable  LFTs / TGs: LFTs wnl, TG 115 at baseline Prealbumin / albumin:  Intake / Output; MIVF: NaCl @ 26ml/hr  GI Imaging: 6/1 CT shows possible developing abscess which was cultured Surgeries / Procedures: s/p open left hemicolectomy with mobilization of the splenic flexure on 5/24    Central access: 5/31 TPN start date: 5/31  Nutritional Goals (per RD recommendation on 5/31): Clinimix E 5/20 at 83 mL/hr x 24 hrs + 20% ILE at 20 mL/hr x 12 hrs. Provides 2233 kcal, 100 grams of protein, 1992 mL fluid from Clinimix daily.  Current Nutrition:  NPO  Plan:   Will continue with reduced rate of Clinimix E 5/20 to 40 mL/hr x 24 hrs + 20% ILE at 20 mL/hr x 12 hrs   continue MIVF at 9ml/hr  Add standard MVI and trace elements to TPN.  Continue Sensitive q6h SSI and adjust as needed   Monitor TPN labs on Mon/Thurs or more often if clinically indicated  RFP  f/u in am 6/6  Pernell Dupre, PharmD, BCPS Clinical Pharmacist 02/11/2020 10:51 AM

## 2020-02-11 NOTE — Plan of Care (Signed)
Continuing with plan of care. 

## 2020-02-11 NOTE — Progress Notes (Signed)
Subjective:  CC: Debra [name] is a 54 y.o. female  Hospital stay day 13, 12 Days Post-Op s/p open left hemicolectomy with mobilization of the splenic flexurefor obstructing sigmoidadenocarcinoma, subsequent midline wound infection  HPI: History obtained via interpreter no issues overnight.  Small bowel movement this morning, tolerating some ice cream as well.  ROS:  General: Denies weight loss, weight gain, fatigue, fevers, chills, and night sweats. Heart: Denies chest pain, palpitations, racing heart, irregular heartbeat, leg pain or swelling, and decreased activity tolerance. Respiratory: Denies breathing difficulty, shortness of breath, wheezing, cough, and sputum. GI: Denies change in appetite, heartburn, nausea, vomiting, constipation, diarrhea, and blood in stool. GU: Denies difficulty urinating, pain with urinating, urgency, frequency, blood in urine.   Objective:   Temp:  [98.1 F (36.7 C)-98.4 F (36.9 C)] 98.4 F (36.9 C) (06/05 0434) Pulse Rate:  [71-75] 74 (06/05 0434) Resp:  [15-20] 20 (06/05 0434) BP: (83-95)/(53-58) 94/58 (06/05 0434) SpO2:  [96 %-98 %] 96 % (06/05 0434)     Height: 5' (152.4 cm) Weight: 85.9 kg BMI (Calculated): 36.98   Intake/Output this shift:   Intake/Output Summary (Last 24 hours) at 02/11/2020 1039 Last data filed at 02/11/2020 0900 Gross per 24 hour  Intake 2139.13 ml  Output 800 ml  Net 1339.13 ml    Constitutional :  alert, cooperative, appears stated age and no distress  Respiratory:  clear to auscultation bilaterally  Cardiovascular:  regular rate and rhythm  Gastrointestinal: soft, non-tender; bowel sounds normal; no masses,  no organomegaly.   Skin: Cool and moist.  Midline wound with wet-to-dry dressing in place.  Minimal serous drainage, with pink healthy granulation tissue.  Psychiatric: Normal affect, non-agitated, not confused       LABS:  CMP Latest Ref Rng & Units 02/11/2020 02/10/2020 02/09/2020  Glucose 70 - 99 mg/dL  141(H) 155(H) 168(H)  BUN 6 - 20 mg/dL 12 11 8   Creatinine 0.44 - 1.00 mg/dL 0.59 0.50 0.34(L)  Sodium 135 - 145 mmol/L 134(L) 135 133(L)  Potassium 3.5 - 5.1 mmol/L 4.0 3.4(L) 3.7  Chloride 98 - 111 mmol/L 99 102 102  CO2 22 - 32 mmol/L 27 27 25   Calcium 8.9 - 10.3 mg/dL 8.1(L) 7.9(L) 7.7(L)  Total Protein 6.5 - 8.1 g/dL - - 6.0(L)  Total Bilirubin 0.3 - 1.2 mg/dL - - 0.3  Alkaline Phos 38 - 126 U/L - - 84  AST 15 - 41 U/L - - 22  ALT 0 - 44 U/L - - 14   CBC Latest Ref Rng & Units 02/10/2020 02/09/2020 02/08/2020  WBC 4.0 - 10.5 K/uL 10.4 11.3(H) 12.0(H)  Hemoglobin 12.0 - 15.0 g/dL 8.5(L) 9.1(L) 8.9(L)  Hematocrit 36.0 - 46.0 % 25.7(L) 27.8(L) 26.3(L)  Platelets 150 - 400 K/uL 392 370 329    RADS: n/a Assessment:   s/p open left hemicolectomy with mobilization of the splenic flexurefor obstructing sigmoidadenocarcinoma, subsequent midline wound infection.  Physical exam unremarkable and wound seems to be clean and doing well.  Continue wet-to-dry dressing changes while in-house, plan for outpatient wound VAC placement if amenable.  Patient does report that she does not quite feel well for some reason and does not have an appetite.  We will advance her diet to soft so she has more options once she is feeling more ready to eat.  We will continue TPN half rate until she has better oral intake in the meantime.  All questions addressed.

## 2020-02-12 LAB — GLUCOSE, CAPILLARY
Glucose-Capillary: 125 mg/dL — ABNORMAL HIGH (ref 70–99)
Glucose-Capillary: 132 mg/dL — ABNORMAL HIGH (ref 70–99)
Glucose-Capillary: 132 mg/dL — ABNORMAL HIGH (ref 70–99)
Glucose-Capillary: 97 mg/dL (ref 70–99)

## 2020-02-12 MED ORDER — ENOXAPARIN SODIUM 40 MG/0.4ML ~~LOC~~ SOLN
40.0000 mg | SUBCUTANEOUS | Status: DC
  Administered 2020-02-13 – 2020-02-14 (×2): 40 mg via SUBCUTANEOUS
  Filled 2020-02-12 (×2): qty 0.4

## 2020-02-12 NOTE — Progress Notes (Signed)
Physical Therapy Treatment Patient Details Name: Debra [name] MRN: [id number] DOB: Mar 22, 1966 Today's Date: 02/12/2020    History of Present Illness 54 year old female with history of hypothyroidism, iron deficient anemia, recent Covid infection in March.  Patient was also recently diagnosed with sigmoid mass, s/p surgery 5/24. Now on TPN and pending imaging to look for abscess    PT Comments    Pt is making good progress towards goals with ability to ambulate around RN station using IV pole. Per RN, pt has been ambulating well multiple times a day and is hopeful for discharge in the next few days. Will continue to progress as able.   Follow Up Recommendations  Home health PT;Supervision - Intermittent     Equipment Recommendations  None recommended by PT    Recommendations for Other Services       Precautions / Restrictions Precautions Precautions: Fall Restrictions Weight Bearing Restrictions: No    Mobility  Bed Mobility Overal bed mobility: Needs Assistance Bed Mobility: Sit to Supine;Supine to Sit     Supine to sit: Supervision Sit to supine: Supervision   General bed mobility comments: slow technique and does slightly grimace and guards stomach  Transfers Overall transfer level: Modified independent Equipment used: None             General transfer comment: safe  Ambulation/Gait Ambulation/Gait assistance: Supervision Gait Distance (Feet): 200 Feet Assistive device: IV Pole Gait Pattern/deviations: Step-through pattern     General Gait Details: ambulated with slow and safe technique. Upright posture noted.   Stairs             Wheelchair Mobility    Modified Rankin (Stroke Patients Only)       Balance Overall balance assessment: Modified Independent                                          Cognition Arousal/Alertness: Awake/alert Behavior During Therapy: WFL for tasks assessed/performed Overall Cognitive  Status: Within Functional Limits for tasks assessed                                        Exercises      General Comments        Pertinent Vitals/Pain Pain Assessment: No/denies pain    Home Living                      Prior Function            PT Goals (current goals can now be found in the care plan section) Acute Rehab PT Goals Patient Stated Goal: Go home ASAP PT Goal Formulation: With patient Time For Goal Achievement: 02/14/20 Potential to Achieve Goals: Good Progress towards PT goals: Progressing toward goals    Frequency    Min 2X/week      PT Plan Current plan remains appropriate    Co-evaluation              AM-PAC PT "6 Clicks" Mobility   Outcome Measure  Help needed turning from your back to your side while in a flat bed without using bedrails?: None Help needed moving from lying on your back to sitting on the side of a flat bed without using bedrails?: None Help needed moving to and from a bed to  a chair (including a wheelchair)?: None Help needed standing up from a chair using your arms (e.g., wheelchair or bedside chair)?: None Help needed to walk in hospital room?: A Little Help needed climbing 3-5 steps with a railing? : A Little 6 Click Score: 22    End of Session   Activity Tolerance: Patient tolerated treatment well Patient left: in bed Nurse Communication: Mobility status PT Visit Diagnosis: Muscle weakness (generalized) (M62.81);Difficulty in walking, not elsewhere classified (R26.2)     Time: 1165-7903 PT Time Calculation (min) (ACUTE ONLY): 10 min  Charges:  $Gait Training: 8-22 mins                     Greggory Stallion, [place], DPT [phone]    Cleland Simkins 02/12/2020, 3:19 PM

## 2020-02-12 NOTE — Plan of Care (Signed)
Continuing with plan of care. 

## 2020-02-12 NOTE — TOC Progression Note (Signed)
Transition of Care [facility]) - Progression Note    Patient Details  Name: Debra [name] MRN: [id number] Date of Birth: 25-Jan-1966  Transition of Care [facility]) CM/SW Contact  Anselm Pancoast, RN Phone Number: 02/12/2020, 11:48 AM  Clinical Narrative:    Current plan is to wean TPN and anticipate discharge early this week. [place] are aware of upcoming discharge and need for [facility] care per previous notes.    Expected Discharge Plan: [place] Barriers to Discharge: Continued Medical Work up  Expected Discharge Plan and Services Expected Discharge Plan: [place]   Discharge Planning Services: CM Consult   Living arrangements for the past 2 months: [place]: PT [place]: [place] ([place]) Date [place]: 02/02/20   Representative spoke with at [place]: [place] ([place]) Interventions    Readmission Risk Interventions No flowsheet data found.

## 2020-02-12 NOTE — Consult Note (Signed)
PHARMACY - TOTAL PARENTERAL NUTRITION CONSULT NOTE   Indication:Prolonged Ileus  Patient Measurements: Height: 5' (152.4 cm) Weight: 85.9 kg (189 lb 6 oz) IBW/kg (Calculated) : 45.5 TPN AdjBW (KG): 55.6 Body mass index is 36.98 kg/m.  Assessment:  54 yo female with post-operative ileus. Patient was admitted with obstructing sigmoid colon mass and had open left hemicolectomy 5/24. She has PMH of Anemia, hypothyroidism, and recent COVID infection (March 2021)  Glucose / Insulin: Sensitive SSI- No Hx of Dm, BG 134-171, 5 units SSI / previous 24h Electrolytes: sodium on lower end of normal Renal: Scr: <1, stable  LFTs / TGs: LFTs wnl, TG 115 at baseline Prealbumin / albumin:  Intake / Output; MIVF: NaCl @ 29ml/hr  GI Imaging: 6/1 CT shows possible developing abscess which was cultured Surgeries / Procedures: s/p open left hemicolectomy with mobilization of the splenic flexure on 5/24    Central access: 5/31 TPN start date: 5/31  Nutritional Goals (per RD recommendation on 5/31): Clinimix E 5/20 at 83 mL/hr x 24 hrs + 20% ILE at 20 mL/hr x 12 hrs. Provides 2233 kcal, 100 grams of protein, 1992 mL fluid from Clinimix daily.  Current Nutrition:  NPO  Plan:   Will continue with reduced rate of Clinimix E 5/20 to 40 mL/hr  TPN to be DCd after bag runs out today.   continue MIVF at 2ml/hr  Continue Sensitive q6h SSI and adjust as needed   Monitor TPN labs on Mon/Thurs or more often if clinically indicated  Pernell Dupre, PharmD, BCPS Clinical Pharmacist 02/12/2020 10:30 AM

## 2020-02-12 NOTE — Progress Notes (Signed)
Subjective:  CC: Debra [name] is a 54 y.o. female  Hospital stay day 14, 13 Days Post-Op s/p open left hemicolectomy with mobilization of the splenic flexurefor obstructing sigmoidadenocarcinoma, subsequent midline wound infection  HPI: History obtained via interpreter no issues overnight.  Tolerated some pasta yesterday  ROS:  General: Denies weight loss, weight gain, fatigue, fevers, chills, and night sweats. Heart: Denies chest pain, palpitations, racing heart, irregular heartbeat, leg pain or swelling, and decreased activity tolerance. Respiratory: Denies breathing difficulty, shortness of breath, wheezing, cough, and sputum. GI: Denies change in appetite, heartburn, nausea, vomiting, constipation, diarrhea, and blood in stool. GU: Denies difficulty urinating, pain with urinating, urgency, frequency, blood in urine.   Objective:   Temp:  [97.9 F (36.6 C)-98.3 F (36.8 C)] 97.9 F (36.6 C) (06/06 0505) Pulse Rate:  [70-73] 70 (06/06 0505) Resp:  [16-20] 20 (06/06 0505) BP: (89-97)/(57-59) 94/57 (06/06 0505) SpO2:  [96 %-97 %] 96 % (06/06 0505)     Height: 5' (152.4 cm) Weight: 85.9 kg BMI (Calculated): 36.98   Intake/Output this shift:   Intake/Output Summary (Last 24 hours) at 02/12/2020 1114 Last data filed at 02/12/2020 0500 Gross per 24 hour  Intake 913.79 ml  Output 1600 ml  Net -686.21 ml    Constitutional :  alert, cooperative, appears stated age and no distress  Respiratory:  clear to auscultation bilaterally  Cardiovascular:  regular rate and rhythm  Gastrointestinal: soft, non-tender; bowel sounds normal; no masses,  no organomegaly.   Skin: Cool and moist.  Midline wound with wet-to-dry dressing in place.  Minimal serous drainage, with pink healthy granulation tissue.  Psychiatric: Normal affect, non-agitated, not confused       LABS:  CMP Latest Ref Rng & Units 02/11/2020 02/10/2020 02/09/2020  Glucose 70 - 99 mg/dL 141(H) 155(H) 168(H)  BUN 6 - 20 mg/dL  12 11 8   Creatinine 0.44 - 1.00 mg/dL 0.59 0.50 0.34(L)  Sodium 135 - 145 mmol/L 134(L) 135 133(L)  Potassium 3.5 - 5.1 mmol/L 4.0 3.4(L) 3.7  Chloride 98 - 111 mmol/L 99 102 102  CO2 22 - 32 mmol/L 27 27 25   Calcium 8.9 - 10.3 mg/dL 8.1(L) 7.9(L) 7.7(L)  Total Protein 6.5 - 8.1 g/dL - - 6.0(L)  Total Bilirubin 0.3 - 1.2 mg/dL - - 0.3  Alkaline Phos 38 - 126 U/L - - 84  AST 15 - 41 U/L - - 22  ALT 0 - 44 U/L - - 14   CBC Latest Ref Rng & Units 02/10/2020 02/09/2020 02/08/2020  WBC 4.0 - 10.5 K/uL 10.4 11.3(H) 12.0(H)  Hemoglobin 12.0 - 15.0 g/dL 8.5(L) 9.1(L) 8.9(L)  Hematocrit 36.0 - 46.0 % 25.7(L) 27.8(L) 26.3(L)  Platelets 150 - 400 K/uL 392 370 329    RADS: n/a Assessment:   s/p open left hemicolectomy with mobilization of the splenic flexurefor obstructing sigmoidadenocarcinoma, subsequent midline wound infection.  Physical exam remains unremarkable and wound seems to be clean and doing well.  Continue wet-to-dry dressing changes while in-house, plan for outpatient wound VAC placement if amenable.    Transition off TPN today, d/c home once home vac arrangements made.  Continue current dressing changes for now. All questions addressed.

## 2020-02-13 LAB — COMPREHENSIVE METABOLIC PANEL
ALT: 12 U/L (ref 0–44)
AST: 21 U/L (ref 15–41)
Albumin: 2 g/dL — ABNORMAL LOW (ref 3.5–5.0)
Alkaline Phosphatase: 93 U/L (ref 38–126)
Anion gap: 6 (ref 5–15)
BUN: 10 mg/dL (ref 6–20)
CO2: 26 mmol/L (ref 22–32)
Calcium: 7.9 mg/dL — ABNORMAL LOW (ref 8.9–10.3)
Chloride: 101 mmol/L (ref 98–111)
Creatinine, Ser: 0.41 mg/dL — ABNORMAL LOW (ref 0.44–1.00)
GFR calc Af Amer: >60 mL/min (ref >60)
GFR calc non Af Amer: >60 mL/min (ref >60)
Glucose, Bld: 93 mg/dL (ref 70–99)
Potassium: 4.1 mmol/L (ref 3.5–5.1)
Sodium: 133 mmol/L — ABNORMAL LOW (ref 135–145)
Total Bilirubin: 0.2 mg/dL — ABNORMAL LOW (ref 0.3–1.2)
Total Protein: 5.7 g/dL — ABNORMAL LOW (ref 6.5–8.1)

## 2020-02-13 LAB — PHOSPHORUS: Phosphorus: 3.3 mg/dL (ref 2.5–4.6)

## 2020-02-13 LAB — AEROBIC/ANAEROBIC CULTURE W GRAM STAIN (SURGICAL/DEEP WOUND): Culture: NO GROWTH

## 2020-02-13 LAB — CBC
HCT: 22.9 % — ABNORMAL LOW (ref 36.0–46.0)
Hemoglobin: 7.4 g/dL — ABNORMAL LOW (ref 12.0–15.0)
MCH: 27.6 pg (ref 26.0–34.0)
MCHC: 32.3 g/dL (ref 30.0–36.0)
MCV: 85.4 fL (ref 80.0–100.0)
Platelets: 388 10*3/uL (ref 150–400)
RBC: 2.68 MIL/uL — ABNORMAL LOW (ref 3.87–5.11)
RDW: 15 % (ref 11.5–15.5)
WBC: 8.9 10*3/uL (ref 4.0–10.5)
nRBC: 0 % (ref 0.0–0.2)

## 2020-02-13 LAB — MAGNESIUM: Magnesium: 2.1 mg/dL (ref 1.7–2.4)

## 2020-02-13 MED ORDER — SODIUM CHLORIDE 0.9 % IV SOLN
3.0000 g | Freq: Four times a day (QID) | INTRAVENOUS | Status: DC
  Administered 2020-02-13 – 2020-02-14 (×3): 3 g via INTRAVENOUS
  Filled 2020-02-13: qty 8
  Filled 2020-02-13 (×2): qty 3
  Filled 2020-02-13: qty 8
  Filled 2020-02-13: qty 3
  Filled 2020-02-13 (×2): qty 8

## 2020-02-13 NOTE — Progress Notes (Addendum)
[place] Hospital Day(s): 15.   Post op day(s): 14 Days Post-Op.   Interval History:  Patient seen and examined no acute events or new complaints overnight.  Patient reports se is feeling good No fever, chills, abdominal pain, nausea, or emesis  She is off TPN, tolerating regular diet + bowel function  We had hoped for discharge home today once home health established and home health vac delivered. There have been some delays regarding this, so we will plan for discharge in the morning.    Vital signs in last 24 hours: [min-max] current  Temp:  [98.5 F (36.9 C)] 98.5 F (36.9 C) (06/07 0413) Pulse Rate:  [73-78] 73 (06/07 0413) Resp:  [16-20] 16 (06/07 0413) BP: (90-91)/(52-53) 91/53 (06/07 0413) SpO2:  [95 %-97 %] 95 % (06/07 0413) Weight:  [86.5 kg] 86.5 kg (06/07 0632)     Height: 5' (152.4 cm) Weight: 86.5 kg BMI (Calculated): 37.24   Intake/Output last 2 shifts:  06/06 0701 - 06/07 0700 In: 1465.9 [P.O.:120; I.V.:1080.8; IV Piggyback:265.1] Out: 1900 [Urine:1900]   Physical Exam:  Constitutional: alert, cooperative and no distress  Respiratory: breathing non-labored at rest  Cardiovascular: regular rate and sinus rhythm  Gastrointestinal: Soft, non-tender, and non-distended Integumentary: Open wound to inferior portion of midline laparotomy incision appears improved, surrounding erythema resolving, no purulence  Labs:  CBC Latest Ref Rng & Units 02/13/2020 02/10/2020 02/09/2020  WBC 4.0 - 10.5 K/uL 8.9 10.4 11.3(H)  Hemoglobin 12.0 - 15.0 g/dL 7.4(L) 8.5(L) 9.1(L)  Hematocrit 36.0 - 46.0 % 22.9(L) 25.7(L) 27.8(L)  Platelets 150 - 400 K/uL 388 392 370   CMP Latest Ref Rng & Units 02/13/2020 02/11/2020 02/10/2020  Glucose 70 - 99 mg/dL 93 141(H) 155(H)  BUN 6 - 20 mg/dL 10 12 11   Creatinine 0.44 - 1.00 mg/dL 0.41(L) 0.59 0.50  Sodium 135 - 145 mmol/L 133(L) 134(L) 135  Potassium 3.5 - 5.1 mmol/L 4.1 4.0 3.4(L)  Chloride 98 - 111  mmol/L 101 99 102  CO2 22 - 32 mmol/L 26 27 27   Calcium 8.9 - 10.3 mg/dL 7.9(L) 8.1(L) 7.9(L)  Total Protein 6.5 - 8.1 g/dL 5.7(L) - -  Total Bilirubin 0.3 - 1.2 mg/dL 0.2(L) - -  Alkaline Phos 38 - 126 U/L 93 - -  AST 15 - 41 U/L 21 - -  ALT 0 - 44 U/L 12 - -    Imaging studies: No new pertinent imaging studies   Assessment/Plan:  54 y.o. female overall doing well 14 Days Post-Op s/p open left hemicolectomy with mobilization of the splenic flexure for obstructing sigmoid adenocarcinoma.   - Continue regular diet  - Review Cx with pharmacy, switch to Unasyn, PO Augmentin for discharge  - Pain control prn; antiemetics prn             - monitor abdominal examination; on-going bowel function             - Wound Care: pack with saline moistened Kerlix, cover with ABD, secure with tape. Continue dressing changes BID. Awaiting VAC for home.  - Continue mobilization      - Discharge Planning; Will plan for DC in the morning once wound vac delivered and home health established   All of the above findings and recommendations were discussed with the patient, and the medical team, and all of patient's and family's questions were answered to their expressed satisfaction.  -- [name], PA-C [facility] 02/13/2020, 3:43 PM [phone]  M-F: 7am - 4pm  I saw and evaluated the patient.  I agree with the above documentation, exam, and plan, which I have edited where appropriate. [name]  10:12 AM

## 2020-02-13 NOTE — Discharge Summary (Addendum)
[facility] SURGICAL DISCHARGE SUMMARY  Patient ID: Debra [name] MRN: [id number] DOB/AGE: 03-11-1966 54 y.o.  Admit date: 01/29/2020 Discharge date: 02/14/2020  Discharge Diagnoses Patient Active Problem List   Diagnosis Date Noted   Bowel obstruction ([place]) 01/29/2020   Colonic mass     Consultants None  Procedures 01/30/2020:  Open left hemicolectomy with mobilization of the splenic flexure  HPI: This is a 54 year old woman who presented to the hospital with anemia back in March.  She underwent colonoscopy that revealed a near obstructing sigmoid mass. Biopsies were inconclusive, but the clinical picture, as well as associated imaging, was all consistent with a primary colon cancer. A virtual colonoscopy demonstrated no other lesions within the colon. She also did not have any lesions in the liver or lungs, based on CT scan. She was referred for partial colectomy. She actually presented to the emergency department last night with nausea and vomiting, due to poor tolerance of her prep. Imaging obtained in the emergency department demonstrated near complete obstruction.  Hospital Course: Informed consent was obtained and documented, and patient underwent uneventful open left hemicolectomy (Dr Celine Ahr, 01/30/2020).  Post-operatively, patient initially did well and diet was advanced. On POD4, she developed nausea and emesis and diet was subsequently backed down and she developed post-surgical ileus. She did require TPN, PICC, and NGT placement. On POD9 (06/02), she continued to have increase in leukocytosis and was subsequently found to have a midline wound infection to the inferior portion of the wound. The following day she passed a clamping trial of her NGT and was subsequently removed. Over the next few days her diet was advanced and she continued to have adequate bowel function. Advancement of patient's diet and ambulation were well-tolerated. The remainder of  patient's hospital course was essentially unremarkable, and discharge planning was initiated accordingly with patient safely able to be discharged home with appropriate discharge instructions, antibiotics (Augmentin x 10 days), pain control, and outpatient follow-up after all of her questions were answered to her expressed satisfaction.   Discharge Condition: Good   Physical Examination:  Constitutional: alert, cooperative and no distress  Respiratory: breathing non-labored at rest  Cardiovascular: regular rate and sinus rhythm  Gastrointestinal: Soft, non-tender, and non-distended Integumentary: Open wound to inferior portion of midline laparotomy incision appears improved, surrounding erythema resolving, no purulence    Allergies as of 02/14/2020   No Known Allergies      Medication List     STOP taking these medications    bisacodyl 5 MG EC tablet Commonly known as: DULCOLAX   erythromycin base 500 MG tablet Commonly known as: E-MYCIN   neomycin 500 MG tablet Commonly known as: MYCIFRADIN   polyethylene glycol powder 17 GM/SCOOP powder Commonly known as: MiraLax       TAKE these medications    amoxicillin-clavulanate 875-125 MG tablet Commonly known as: Augmentin Take 1 tablet by mouth 2 (two) times daily for 10 days.   ferrous sulfate 325 (65 FE) MG tablet Take 1 tablet (325 mg total) by mouth 2 (two) times daily with a meal.   ibuprofen 800 MG tablet Commonly known as: ADVIL Take 1 tablet (800 mg total) by mouth every 8 (eight) hours as needed.   levothyroxine 137 MCG tablet Commonly known as: SYNTHROID Take 137 mcg by mouth daily before breakfast.   oxyCODONE 5 MG immediate release tablet Commonly known as: Oxy IR/ROXICODONE Take 1 tablet (5 mg total) by mouth every 6 (six) hours as needed for severe pain.  senna-docusate 8.6-50 MG tablet Commonly known as: Senokot-S Take 1 tablet by mouth at bedtime.         Follow-up Information      [name], MD. Schedule an appointment as soon as possible for a visit in 1 week(s).   Specialty: General Surgery Why: s/p sigmoid colectomy, please have patient bring wound vac supplies, needs staples out  Contact information: [place] [address] [phone]         Earlie Server, MD. Schedule an appointment as soon as possible for a visit in 1 week(s).   Specialty: Oncology Why: colon cancer, s/p surgery Contact information: [place] [address] [phone]             Time spent on discharge management including discussion of hospital course, clinical condition, outpatient instructions, prescriptions, and follow up with the patient and members of the medical team: >30 minutes  -- [name] , PA-C [facility]  02/14/2020, 1:09 PM [phone] M-F: 7am - 4pm  I agree with the above documentation, exam, and plan, which I have edited where appropriate. [name]  1:35 PM

## 2020-02-13 NOTE — Progress Notes (Signed)
Pharmacy Antibiotic Note  Debra [name] is a 54 y.o. female admitted on 01/29/2020 with IAI.  Pharmacy has been consulted for Zosyn dosing.  Plan: Zosyn 3.375g IV q8h (4 hour infusion).  Height: 5' (152.4 cm) Weight: 86.5 kg (190 lb 11.2 oz) IBW/kg (Calculated) : 45.5  Temp (24hrs), Avg:98.5 F (36.9 C), Min:98.5 F (36.9 C), Max:98.5 F (36.9 C)  Recent Labs  Lab 02/07/20 0549 02/07/20 0549 02/08/20 0549 02/08/20 1316 02/09/20 0511 02/09/20 0823 02/10/20 0606 02/10/20 0808 02/11/20 0753 02/13/20 0822  WBC 11.3*  --   --  12.0*  --  11.3*  --  10.4  --  8.9  CREATININE 0.43*   < > 0.41*  --  0.34*  --  0.50  --  0.59 0.41*   < > = values in this interval not displayed.    Estimated Creatinine Clearance: 77.6 mL/min (A) (by C-G formula based on SCr of 0.41 mg/dL (L)).    No Known Allergies  Antimicrobials this admission: Ceftriaxone 5/25 >>5/30 Zosyn 5/30 >>  Dose adjustments this admission:   Microbiology results: 5/25 UCx: NG  6/2 peritoneal body fluid Cx: NGF 6/2 abcess cx: rare E. Faecalis, mod bacteroides  Thank you for allowing pharmacy to be a part of this patient's care.  Rocky Morel 02/13/2020 3:05 PM

## 2020-02-13 NOTE — Consult Note (Signed)
Pharmacy Antibiotic Note  Debra [name] is a 53 y.o. female admitted on 01/29/2020 with intra-abdominal infection.  Pharmacy has been consulted for Unasyn dosing.  Plan: Unasyn 3g IV Q6 hours  Height: 5' (152.4 cm) Weight: 86.5 kg (190 lb 11.2 oz) IBW/kg (Calculated) : 45.5  Temp (24hrs), Avg:98.5 F (36.9 C), Min:98.5 F (36.9 C), Max:98.5 F (36.9 C)  Recent Labs  Lab 02/07/20 0549 02/07/20 0549 02/08/20 0549 02/08/20 1316 02/09/20 0511 02/09/20 0823 02/10/20 0606 02/10/20 0808 02/11/20 0753 02/13/20 0822  WBC 11.3*  --   --  12.0*  --  11.3*  --  10.4  --  8.9  CREATININE 0.43*   < > 0.41*  --  0.34*  --  0.50  --  0.59 0.41*   < > = values in this interval not displayed.    Estimated Creatinine Clearance: 77.6 mL/min (A) (by C-G formula based on SCr of 0.41 mg/dL (L)).    No Known Allergies  Antimicrobials this admission:  Unasyn 6/7 >> Zosyn 5/30 >> 6/7 Rocephin 5/25 >> 5/29 Cefotetan 5/24 x1  Microbiology results: 6/2 BCx: rare E. Faecalis 5/25 UCx: NGF   Thank you for allowing pharmacy to be a part of this patient's care.  Debra [name] 02/13/2020 4:34 PM

## 2020-02-14 DIAGNOSIS — T8149XA Infection following a procedure, other surgical site, initial encounter: Secondary | ICD-10-CM

## 2020-02-14 MED ORDER — OXYCODONE HCL 5 MG PO TABS
5.0000 mg | ORAL_TABLET | Freq: Four times a day (QID) | ORAL | 0 refills | Status: DC | PRN
Start: 2020-02-14 — End: 2020-03-28

## 2020-02-14 MED ORDER — IBUPROFEN 800 MG PO TABS
800.0000 mg | ORAL_TABLET | Freq: Three times a day (TID) | ORAL | 0 refills | Status: DC | PRN
Start: 2020-02-14 — End: 2021-07-10

## 2020-02-14 MED ORDER — AMOXICILLIN-POT CLAVULANATE 875-125 MG PO TABS
1.0000 | ORAL_TABLET | Freq: Two times a day (BID) | ORAL | 0 refills | Status: AC
Start: 2020-02-14 — End: 2020-02-24

## 2020-02-14 NOTE — Progress Notes (Signed)
Midline Wound Measurements  Location: Inferior Portion of Midline Incisional Wound  Length: 6 cm Width: 2 cm Depth: 4 cm  -- [name], PA-C [facility] 02/14/2020, 9:27 AM [phone] M-F: 7am - 4pm

## 2020-02-14 NOTE — TOC Transition Note (Signed)
Transition of Care [facility]) - CM/SW Discharge Note   Patient Details  Name: Debra [name] MRN: [id number] Date of Birth: 12-29-1965  [facility] CM/SW Contact:  Beverly Sessions, RN Phone Number: 02/14/2020, 1:57 PM   Clinical Narrative:     Patient to discharge today Vac has been delivered to bedside by [place] with [facility] confirmed they can accept patient He is aware that patient will need a spanish interpreter  RNCM again confirmed patient's telephone number and address   Final next level of care: [place] Barriers to Discharge: No Barriers Identified   Patient Goals and CMS Choice        Discharge Placement                       Discharge Plan and Services   Discharge Planning Services: CM Consult            DME Arranged: Vac DME Agency: [employer] Date DME Agency Contacted: 02/14/20   Representative spoke with at DME Agency: [place]: RN, PT [place] Agency: [place] ([place]) Date [place]: 02/14/20   Representative spoke with at [place]: [place] ([place]) Interventions     Readmission Risk Interventions No flowsheet data found.

## 2020-02-14 NOTE — Progress Notes (Signed)
Pt discharged per MD order. IV removed. Discharge instructions reviewed with pt via intepreter. Pt had home health wound vac placed earlier in the shift by PA and RN. Pt took all supplies home. Discharge instructions reviewed with pt. Pt verbalized understanding with all questions answered to pt satisfaction. Pt taken to car in wheelchair by staff.

## 2020-02-14 NOTE — Progress Notes (Signed)
Patient is complaining of burning during urination.  Will pass information on to day shift staff. Debra [name]

## 2020-02-15 ENCOUNTER — Other Ambulatory Visit: Payer: Self-pay

## 2020-02-15 ENCOUNTER — Encounter: Payer: Self-pay | Admitting: Oncology

## 2020-02-15 ENCOUNTER — Inpatient Hospital Stay: Payer: Self-pay

## 2020-02-15 ENCOUNTER — Inpatient Hospital Stay: Payer: Self-pay | Attending: Oncology | Admitting: Oncology

## 2020-02-15 VITALS — BP 106/72 | HR 83 | Temp 97.8°F | Wt 179.5 lb

## 2020-02-15 DIAGNOSIS — D500 Iron deficiency anemia secondary to blood loss (chronic): Secondary | ICD-10-CM

## 2020-02-15 DIAGNOSIS — Z8616 Personal history of COVID-19: Secondary | ICD-10-CM | POA: Insufficient documentation

## 2020-02-15 DIAGNOSIS — R531 Weakness: Secondary | ICD-10-CM | POA: Insufficient documentation

## 2020-02-15 DIAGNOSIS — Z79899 Other long term (current) drug therapy: Secondary | ICD-10-CM | POA: Insufficient documentation

## 2020-02-15 DIAGNOSIS — E039 Hypothyroidism, unspecified: Secondary | ICD-10-CM | POA: Insufficient documentation

## 2020-02-15 DIAGNOSIS — R5383 Other fatigue: Secondary | ICD-10-CM | POA: Insufficient documentation

## 2020-02-15 DIAGNOSIS — Z791 Long term (current) use of non-steroidal anti-inflammatories (NSAID): Secondary | ICD-10-CM | POA: Insufficient documentation

## 2020-02-15 DIAGNOSIS — K6389 Other specified diseases of intestine: Secondary | ICD-10-CM

## 2020-02-15 DIAGNOSIS — C187 Malignant neoplasm of sigmoid colon: Secondary | ICD-10-CM

## 2020-02-15 DIAGNOSIS — Z7189 Other specified counseling: Secondary | ICD-10-CM

## 2020-02-15 DIAGNOSIS — R634 Abnormal weight loss: Secondary | ICD-10-CM

## 2020-02-15 DIAGNOSIS — Z9049 Acquired absence of other specified parts of digestive tract: Secondary | ICD-10-CM | POA: Insufficient documentation

## 2020-02-15 LAB — CBC WITH DIFFERENTIAL/PLATELET
Abs Immature Granulocytes: 0.12 10*3/uL — ABNORMAL HIGH (ref 0.00–0.07)
Basophils Absolute: 0 10*3/uL (ref 0.0–0.1)
Basophils Relative: 0 %
Eosinophils Absolute: 0.1 10*3/uL (ref 0.0–0.5)
Eosinophils Relative: 1 %
HCT: 25.7 % — ABNORMAL LOW (ref 36.0–46.0)
Hemoglobin: 8.3 g/dL — ABNORMAL LOW (ref 12.0–15.0)
Immature Granulocytes: 1 %
Lymphocytes Relative: 8 %
Lymphs Abs: 0.8 10*3/uL (ref 0.7–4.0)
MCH: 26.9 pg (ref 26.0–34.0)
MCHC: 32.3 g/dL (ref 30.0–36.0)
MCV: 83.2 fL (ref 80.0–100.0)
Monocytes Absolute: 0.9 10*3/uL (ref 0.1–1.0)
Monocytes Relative: 9 %
Neutro Abs: 8.2 10*3/uL — ABNORMAL HIGH (ref 1.7–7.7)
Neutrophils Relative %: 81 %
Platelets: 424 10*3/uL — ABNORMAL HIGH (ref 150–400)
RBC: 3.09 MIL/uL — ABNORMAL LOW (ref 3.87–5.11)
RDW: 15.1 % (ref 11.5–15.5)
WBC: 10 10*3/uL (ref 4.0–10.5)
nRBC: 0 % (ref 0.0–0.2)

## 2020-02-15 LAB — RETIC PANEL
Immature Retic Fract: 10.8 % (ref 2.3–15.9)
RBC.: 3.06 MIL/uL — ABNORMAL LOW (ref 3.87–5.11)
Retic Count, Absolute: 36.1 10*3/uL (ref 19.0–186.0)
Retic Ct Pct: 1.2 % (ref 0.4–3.1)
Reticulocyte Hemoglobin: 19.3 pg — ABNORMAL LOW (ref >27.9)

## 2020-02-15 LAB — IRON AND TIBC
Iron: 9 ug/dL — ABNORMAL LOW (ref 28–170)
Saturation Ratios: 4 % — ABNORMAL LOW (ref 10.4–31.8)
TIBC: 203 ug/dL — ABNORMAL LOW (ref 250–450)
UIBC: 194 ug/dL

## 2020-02-15 LAB — FERRITIN: Ferritin: 112 ng/mL (ref 11–307)

## 2020-02-15 LAB — FOLATE: Folate: 13 ng/mL (ref >5.9)

## 2020-02-15 NOTE — Progress Notes (Signed)
Hematology/Oncology Progress Note [facility] Telephone:([phone] [phone]  Patient Care Team: [name], MD as PCP - General (Family Medicine) [name], RN as Registered Nurse [name], RN as Registered Nurse   Name of the patient: Debra [name]  [id number]  1966/04/14  Date of visit: 02/15/20   INTERVAL HISTORY-  54 y.o. female presents for management of colon cancer Patient was previously seen by me on 11/21/2019 during her hospitalization. Patient has a history of Covid positivity. Severe iron deficiency status post IV Venofer treatment. Colonoscopy 11/21/2019 showed a malignant appearing severe stenosis found in the sigmoid colon at 25 cm proximal to the anus and was nontransversed.  Biopsy was taken.  Surgical pathology is pending  Nonbleeding external hemorrhoids Biopsy was not diagnostic 01/03/2020 virtual colonoscopy showed long segment area of wall thickening and annular constriction within the sigmoid colon in the area of previous concern most compatible with annular malignancy.  Remainder of the colon evaluation is somewhat limited due to large amount of retained stool and incomplete distention proximal to the annular constricting lesion. 01/30/2020 Patient underwent left hemicolectomy by Dr. Celine Ahr and a biopsy showed invasive adenocarcinoma, poorly differentiated.  37 lymph nodes negative for malignancy.  Grade 3, all margins are uninvolved.  Perineural invasion present.  Tumor deposit present. pT3 pN1c cM0 Checks x-ray did not show any lung mass. #Postop, patient developed postop ileus and she required TPN, PICC and NG tube placement.  Postoperation day 9 she continued to have increased leukocytosis and was subsequently found to have a midline wound infection to the inferior portion of the wound.  Patient was treated with IV antibiotics.  She is able to tolerate p.o. and was discharged yesterday with a course of  antibiotics Augmentin for 10 days.  Today patient reports feeling weak and fatigued.  No fever, chills, pain is well controlled.   Review of systems- Review of Systems  Constitutional: Positive for appetite change, fatigue and unexpected weight change.  Respiratory: Negative for shortness of breath.   Cardiovascular: Negative for chest pain.  Gastrointestinal:       Wound vac  Genitourinary: Negative for dysuria.   Skin: Positive for wound. Negative for rash.  Neurological: Negative for headaches and light-headedness.  Hematological: Negative for adenopathy.  Psychiatric/Behavioral: Negative for confusion.    No Known Allergies  Patient Active Problem List   Diagnosis Date Noted  . Pressure injury of skin 02/03/2020  . Bowel obstruction ([place]) 01/29/2020  . Iron deficiency anemia due to chronic blood loss 11/22/2019  . Colonic mass   . COVID-19 virus detected 11/20/2019  . Symptomatic anemia 11/20/2019  . Hypothyroidism 11/20/2019  . Iron deficiency anemia 11/20/2019  . GIB (gastrointestinal bleeding) 11/20/2019     Past Medical History:  Diagnosis Date  . Anemia   . Hypothyroidism   . Thyroid disease      Past Surgical History:  Procedure Laterality Date  . COLONOSCOPY WITH PROPOFOL N/A 11/21/2019   Procedure: COLONOSCOPY WITH PROPOFOL;  Surgeon: Lin Landsman, MD;  Location: [facility] ENDOSCOPY;  Service: Gastroenterology;  Laterality: N/A;  . CYSTOSCOPY WITH STENT PLACEMENT Left 01/30/2020   Procedure: CYSTOSCOPY WITH STENT PLACEMENT;  Surgeon: Hollice Espy, MD;  Location: [facility] ORS;  Service: Urology;  Laterality: Left;  . ESOPHAGOGASTRODUODENOSCOPY (EGD) WITH PROPOFOL N/A 11/21/2019   Procedure: ESOPHAGOGASTRODUODENOSCOPY (EGD) WITH PROPOFOL;  Surgeon: Lin Landsman, MD;  Location: [facility] ENDOSCOPY;  Service: Gastroenterology;  Laterality: N/A;  . PARTIAL COLECTOMY N/A 01/30/2020  Procedure: PARTIAL COLECTOMY Sigmoid;  Surgeon: [name], MD;   Location: [facility] ORS;  Service: General;  Laterality: N/A;    Social History   Socioeconomic History  . Marital status: Married    Spouse name: Not on file  . Number of children: Not on file  . Years of education: Not on file  . Highest education level: Not on file  Occupational History  . Occupation: Factory  Tobacco Use  . Smoking status: Never Smoker  . Smokeless tobacco: Never Used  Substance and Sexual Activity  . Alcohol use: Never  . Drug use: Never  . Sexual activity: Not on file  Other Topics Concern  . Not on file  Social History Narrative  . Not on file   Social Determinants of Health   Financial Resource Strain:   . Difficulty of Paying Living Expenses:   Food Insecurity:   . Worried About Charity fundraiser in the Last Year:   . Arboriculturist in the Last Year:   Transportation Needs:   . Film/video editor (Medical):   Marland Kitchen Lack of Transportation (Non-Medical):   Physical Activity:   . Days of Exercise per Week:   . Minutes of Exercise per Session:   Stress:   . Feeling of Stress :   Social Connections:   . Frequency of Communication with Friends and Family:   . Frequency of Social Gatherings with Friends and Family:   . Attends Religious Services:   . Active Member of Clubs or Organizations:   . Attends Archivist Meetings:   Marland Kitchen Marital Status:   Intimate Partner Violence:   . Fear of Current or Ex-Partner:   . Emotionally Abused:   Marland Kitchen Physically Abused:   . Sexually Abused:      Family History  Problem Relation Age of Onset  . Diabetes Sister      Current Outpatient Medications:  .  ibuprofen (ADVIL) 800 MG tablet, Take 1 tablet (800 mg total) by mouth every 8 (eight) hours as needed., Disp: 30 tablet, Rfl: 0 .  levothyroxine (SYNTHROID) 137 MCG tablet, Take 137 mcg by mouth daily before breakfast. , Disp: , Rfl:  .  amoxicillin-clavulanate (AUGMENTIN) 875-125 MG tablet, Take 1 tablet by mouth 2 (two) times daily for 10 days.  (Patient not taking: Reported on 02/15/2020), Disp: 20 tablet, Rfl: 0 .  ferrous sulfate 325 (65 FE) MG tablet, Take 1 tablet (325 mg total) by mouth 2 (two) times daily with a meal. (Patient not taking: Reported on 01/11/2020), Disp: 60 tablet, Rfl: 3 .  oxyCODONE (OXY IR/ROXICODONE) 5 MG immediate release tablet, Take 1 tablet (5 mg total) by mouth every 6 (six) hours as needed for severe pain. (Patient not taking: Reported on 02/15/2020), Disp: 15 tablet, Rfl: 0 .  senna-docusate (SENOKOT-S) 8.6-50 MG tablet, Take 1 tablet by mouth at bedtime. (Patient not taking: Reported on 02/15/2020), Disp: 30 tablet, Rfl: 1   Physical exam:  Vitals:   02/15/20 0950  BP: 106/72  Pulse: 83  Temp: 97.8 F (36.6 C)  TempSrc: Tympanic  SpO2: 99%  Weight: 179 lb 8 oz (81.4 kg)   Physical Exam  Constitutional: She is oriented to person, place, and time. No distress.  Patient states in the wheelchair.  Frail  HENT:  Head: Normocephalic and atraumatic.  Mouth/Throat: No oropharyngeal exudate.  Eyes: Pupils are equal, round, and reactive to light. EOM are normal. No scleral icterus.  Cardiovascular: Normal rate and regular rhythm.  No murmur heard. Pulmonary/Chest: Effort normal. No respiratory distress.  Abdominal: Soft. She exhibits no distension. There is no abdominal tenderness.  Musculoskeletal:        General: No edema. Normal range of motion.     Cervical back: Normal range of motion and neck supple.  Neurological: She is alert and oriented to person, place, and time.  Skin: Skin is warm and dry. No erythema.  Psychiatric: Affect normal.       CMP Latest Ref Rng & Units 02/13/2020  Glucose 70 - 99 mg/dL 93  BUN 6 - 20 mg/dL 10  Creatinine 0.44 - 1.00 mg/dL 0.41(L)  Sodium 135 - 145 mmol/L 133(L)  Potassium 3.5 - 5.1 mmol/L 4.1  Chloride 98 - 111 mmol/L 101  CO2 22 - 32 mmol/L 26  Calcium 8.9 - 10.3 mg/dL 7.9(L)  Total Protein 6.5 - 8.1 g/dL 5.7(L)  Total Bilirubin 0.3 - 1.2 mg/dL 0.2(L)    Alkaline Phos 38 - 126 U/L 93  AST 15 - 41 U/L 21  ALT 0 - 44 U/L 12   CBC Latest Ref Rng & Units 02/15/2020  WBC 4.0 - 10.5 K/uL 10.0  Hemoglobin 12.0 - 15.0 g/dL 8.3(L)  Hematocrit 36.0 - 46.0 % 25.7(L)  Platelets 150 - 400 K/uL 424(H)    RADIOGRAPHIC STUDIES: I have personally reviewed the radiological images as listed and agreed with the findings in the report. DG Chest 2 View  Result Date: 02/06/2020 CLINICAL DATA:  Productive cough with yellow sputum;  Nonsmoker, EXAM: CHEST - 2 VIEW COMPARISON:  None. FINDINGS: NG tube extends the stomach. Side port below the GE junction. Normal cardiac silhouette. No effusion, infiltrate pneumothorax. IMPRESSION: NG tube in stomach.  No acute cardiopulmonary process. Electronically Signed   By: Suzy Bouchard M.D.   On: 02/06/2020 11:24   DG Abd 1 View  Result Date: 02/05/2020 CLINICAL DATA:  Six days postop from partial colectomy for colon carcinoma. Nausea and vomiting. EXAM: ABDOMEN - 1 VIEW COMPARISON:  02/04/2020 FINDINGS: Nasogastric tube tip is again seen within the distal body of the stomach. Small amount of bowel gas is seen within the right colon. No evidence of dilated bowel loops. Contrast material is seen within the urinary bladder. IMPRESSION: 1. Nonobstructive bowel gas pattern. 2. Nasogastric tube tip in appropriate position. Electronically Signed   By: Marlaine Hind M.D.   On: 02/05/2020 07:39   DG Abd 1 View  Result Date: 02/04/2020 CLINICAL DATA:  Nasogastric tube placement. Status post resection of sigmoid colon cancer. EXAM: ABDOMEN - 1 VIEW COMPARISON:  02/03/2020 FINDINGS: Nasogastric tube is been placed which extends into the stomach with the tip located in the expected position of the mid to distal body. No significant ileus or obstructive bowel gas pattern identified. IMPRESSION: Nasogastric tube extends into the stomach with the tip in the expected position of the mid to distal body. Electronically Signed   By: Aletta Edouard M.D.   On: 02/04/2020 11:19   US Guided Needle Placement  Result Date: 02/09/2020 INDICATION: Peritoneal fluid collections and status post resection of sigmoid colonic carcinoma. EXAM: ULTRASOUND-GUIDED ASPIRATION OF ABDOMINAL FLUID MEDICATIONS: The patient is currently admitted to the hospital and receiving intravenous antibiotics. The antibiotics were administered within an appropriate time frame prior to the initiation of the procedure. ANESTHESIA/SEDATION: Fentanyl 100 mcg IV; Versed 2.0 mg IV Moderate Sedation Time:  14 minutes. The patient was continuously monitored during the procedure by the interventional radiology nurse under my direct supervision.  COMPLICATIONS: None immediate. PROCEDURE: Informed written consent was obtained from the patient after a thorough discussion of the procedural risks, benefits and alternatives. All questions were addressed. Maximal Sterile Barrier Technique was utilized including caps, mask, sterile gowns, sterile gloves, sterile drape, hand hygiene and skin antiseptic. A timeout was performed prior to the initiation of the procedure. Fluid in the right abdomen inferior to the tip of the liver was localized. Local anesthesia was provided with 1% lidocaine. Under direct ultrasound guidance, initially a 6 [place] Safe-T-Centesis catheter was advanced. This was followed by a 5 [place] Yueh catheter. Aspiration of fluid was performed and the fluid sample sent for culture analysis. FINDINGS: Fluid is present inferior to the tip of the liver as seen by recent CT. There was not initial good return via the Safe-T-Centesis catheter due to buckling of the catheter in the abdominal wall. 40 mL of clear, orange colored fluid was able to be removed via a Yueh centesis catheter. IMPRESSION: Ultrasound-guided aspiration of right-sided abdominal fluid inferior to the liver. 40 mL of clear, orangish fluid was removed and sent for culture analysis. Electronically Signed   By: Aletta Edouard M.D.   On: 02/09/2020 07:56   CT ABDOMEN PELVIS W CONTRAST  Result Date: 02/07/2020 CLINICAL DATA:  54 year old female with history of left colectomy for obstructing colon cancer presenting with signs and symptoms of possible abdominal infection or abscess. EXAM: CT ABDOMEN AND PELVIS WITH CONTRAST TECHNIQUE: Multidetector CT imaging of the abdomen and pelvis was performed using the standard protocol following bolus administration of intravenous contrast. CONTRAST:  154mL OMNIPAQUE IOHEXOL 300 MG/ML  SOLN COMPARISON:  CT the abdomen and pelvis 02/04/2020. FINDINGS: Lower chest: Small bilateral pleural effusions with some dependent subsegmental atelectasis in the lower lobes of the lungs bilaterally. Nasogastric tube. Central venous catheter tip extending into the right atrium in the plane of the tricuspid valve. Hepatobiliary: No suspicious cystic or solid hepatic lesions. No intra or extrahepatic biliary ductal dilatation. Gallbladder is normal in appearance. Pancreas: No pancreatic mass. No pancreatic ductal dilatation. No peripancreatic inflammatory changes. Spleen: Unremarkable. Adrenals/Urinary Tract: Bilateral kidneys and adrenal glands are normal in appearance. No hydroureteronephrosis. Urinary bladder is normal in appearance. Stomach/Bowel: Nasogastric tube terminating in the distal gastric body. Stomach is otherwise normal in appearance. No pathologic dilatation of small bowel or colon. Status post left hemicolectomy. Immediately posterior to the suture line (axial image 58 of series 2, coronal image 41 of series 5, and sagittal image 81 of series 6) there is a small collection of extraluminal gas concerning for potential anastomotic suture breakdown. Normal appendix. Vascular/Lymphatic: No significant atherosclerotic disease, aneurysm or dissection noted in the abdominal or pelvic vasculature. No lymphadenopathy identified in the abdomen or pelvis. Reproductive: Uterus and ovaries are  unremarkable in appearance. Other: Small volume of ascites, most evident in the right pericolic gutter where the collection of fluid is similar in size to the prior study from 02/04/2020. No definite rim enhancement to this collection in the right pericolic gutter. However, in the left upper quadrant of the abdomen adjacent to the tail of the pancreas and the splenic hilum (axial image 25 of series 2 and coronal image 62 of series 5) there is a 3.6 x 3.0 x 4.4 cm low-attenuation fluid and gas collection which appears slightly increased compared to the prior study with some mild rim enhancement. There is an adjacent collection inferior to the spleen (axial image 32 of series 2 and coronal image 69 of series 5) measuring  4.6 x 1.9 x 3.6 cm which is also centrally low-attenuation with some mild peripheral enhancement, similar to the prior examination. Small amounts of pneumoperitoneum are noted throughout the peritoneal cavity, including several locules of gas within the omentum and sigmoid mesocolon. Musculoskeletal: There are no aggressive appearing lytic or blastic lesions noted in the visualized portions of the skeleton. Healing midline abdominal wound with overlying skin staples. Within the subcutaneous fat of the anterior abdominal wall superficial to the anterior abdominal wall musculature but deep to the skin sutures there are several small collections of fluid which appear increasing compared to the prior examination, largest of which measures 2.1 x 1.5 cm (axial image 68 of series 3). IMPRESSION: 1. Small amount of gas immediately adjacent to the anastomotic suture line from the recent left hemicolectomy which appears to be extraluminal, concerning for potential breakdown of the anastomotic suture line. 2. Small volume of pneumoperitoneum and ascites which generally appears similar to the prior study, with exception of slight enlargement of a gas and fluid containing collection in the left upper quadrant  adjacent to the tail of the pancreas and splenic hilum. This and an adjacent fluid collection demonstrates some peripheral enhancement. The possibility of developing intra-abdominal abscesses are not excluded. 3. Increasing fluid in the subcutaneous fat of the healing anterior abdominal wound, which may represent seromas, although the possibility of developing wound infection is not excluded. 4. Small bilateral pleural effusions with areas of dependent subsegmental atelectasis in the lower lobes of the lungs bilaterally. Electronically Signed   By: Vinnie Langton M.D.   On: 02/07/2020 13:15   CT ABDOMEN PELVIS W CONTRAST  Result Date: 02/04/2020 CLINICAL DATA:  History of recent partial colectomy for sigmoid carcinoma EXAM: CT ABDOMEN AND PELVIS WITH CONTRAST TECHNIQUE: Multidetector CT imaging of the abdomen and pelvis was performed using the standard protocol following bolus administration of intravenous contrast. CONTRAST:  117mL OMNIPAQUE IOHEXOL 300 MG/ML  SOLN COMPARISON:  01/29/2020 FINDINGS: Lower chest: Bilateral small effusions are noted. Lower lobe atelectasis is noted consistent with the postoperative state. Hepatobiliary: Liver is within normal limits. Gallbladder is unremarkable. Pancreas: Unremarkable. No pancreatic ductal dilatation or surrounding inflammatory changes. Spleen: Normal in size without focal abnormality. Adrenals/Urinary Tract: Adrenal glands are within normal limits. The kidneys demonstrate a normal enhancement pattern bilaterally. Normal excretion of contrast is noted bilaterally. No obstructive changes are seen. The bladder is partially distended. Tiny focus of air is noted within the bladder consistent with prior instrumentation. Stomach/Bowel: Postsurgical changes are noted within the colon consistent with the recent surgery. Anastomosis between the mid transverse colon and sigmoid colon is noted. Air is noted within the transverse colon just proximal to the anastomosis  although no definitive extravasation is noted. Mild fluid is noted adjacent to the anastomosis likely postoperative in nature. Small amount of fluid is noted along the liver margin inferiorly and along the dome of the liver also likely related to the prior surgery. Stomach is well distended with contrast material. Small-bowel appears nonobstructive. The appendix is within normal limits. Vascular/Lymphatic: No significant vascular findings are present. No enlarged abdominal or pelvic lymph nodes. Reproductive: Uterus and bilateral adnexa are unremarkable. Other: Mild free fluid is noted within the abdomen as described consistent with the postoperative state. No findings to suggest perforation are noted at this time. Musculoskeletal: Degenerative changes of the lumbar spine are noted. IMPRESSION: Changes consistent with the prior surgery with small amount of residual fluid within the abdomen. No evidence of perforation is noted at  this time. Bilateral small effusions and atelectatic changes consistent with the postoperative state. Electronically Signed   By: Inez Catalina M.D.   On: 02/04/2020 13:48   CT ABDOMEN PELVIS W CONTRAST  Result Date: 01/29/2020 CLINICAL DATA:  Generalized abdominal pain, vomiting, sigmoid mass suspicious for malignancy EXAM: CT ABDOMEN AND PELVIS WITH CONTRAST TECHNIQUE: Multidetector CT imaging of the abdomen and pelvis was performed using the standard protocol following bolus administration of intravenous contrast. CONTRAST:  188mL OMNIPAQUE IOHEXOL 300 MG/ML  SOLN COMPARISON:  01/03/2020 FINDINGS: Lower chest: No acute pleural or parenchymal lung disease. Hepatobiliary: No focal liver abnormality is seen. No gallstones, gallbladder wall thickening, or biliary dilatation. Pancreas: Unremarkable. No pancreatic ductal dilatation or surrounding inflammatory changes. Spleen: Normal in size without focal abnormality. Adrenals/Urinary Tract: Adrenal glands are unremarkable. Kidneys are  normal, without renal calculi, focal lesion, or hydronephrosis. Bladder is unremarkable. Stomach/Bowel: Segmental circumferential wall thickening within the proximal sigmoid colon is again seen, measuring 5 cm in length, concerning for malignancy. There is significant proximal colonic distention with large amount of retained stool. There may be an element of obstruction related to the sigmoid mass. Normal appendix right lower quadrant. Vascular/Lymphatic: No significant vascular findings are present. No enlarged abdominal or pelvic lymph nodes. Reproductive: Uterus and bilateral adnexa are unremarkable. Other: No abdominal wall hernia or abnormality. No abdominopelvic ascites. Musculoskeletal: No acute or destructive bony lesions. Reconstructed images demonstrate no additional findings. IMPRESSION: 1. Circumferential wall thickening proximal sigmoid colon concerning for malignancy. No change since prior study. 2. Significant fecal retention and colonic distention proximal to the sigmoid mass, consistent with an element of colonic obstruction. Electronically Signed   By: Randa Ngo M.D.   On: 01/29/2020 20:42   DG ABD ACUTE 2+V W 1V CHEST  Result Date: 02/03/2020 CLINICAL DATA:  Abdominal pain and vomiting, known sigmoid mass EXAM: DG ABDOMEN ACUTE W/ 1V CHEST COMPARISON:  CT from 01/29/2020 FINDINGS: Cardiac shadow is within normal limits. The lungs are well aerated bilaterally. No focal infiltrate or sizable effusion is seen. Abdomen demonstrates postsurgical changes. A relative paucity of bowel gas is noted consistent with the recent history. Mild free air is noted consistent with the recent surgical history. No abnormal mass or abnormal calcifications are noted. IMPRESSION: No definitive obstructive changes are seen. Postsurgical changes noted consistent with the given clinical history. Mild free air is noted although felt to be expected given the recent surgical history. Electronically Signed   By: Inez Catalina M.D.   On: 02/03/2020 11:18   DG C-Arm 1-60 Min-No Report  Result Date: 01/30/2020 Fluoroscopy was utilized by the requesting physician.  No radiographic interpretation.   [name] EKG SITE RITE  Result Date: 02/06/2020 If Site Rite image not attached, placement could not be confirmed due to current cardiac rhythm.   Assessment and plan-  1. Malignant neoplasm of sigmoid colon ([place])   2. Iron deficiency anemia due to chronic blood loss   3. Loss of weight   4. Goals of care, counseling/discussion   Cancer Staging Colon cancer [facility]) Staging form: Colon and Rectum, AJCC 8th Edition - Clinical: No stage assigned - Unsigned - Pathologic: Stage IIIB (pT3, pN1c, cM0) - Signed by Earlie Server, MD on 02/15/2020  #Stage IIIb colon cancer Pathology was independently reviewed by me and discussed with the patient. The diagnosis and care plan were discussed with patient in detail.  NCCN guidelines were reviewed and shared with patient.   The goal of treatment is curative intent.  If she is able to make reasonable recovery, I recommend patient to proceed with adjuvant chemotherapy FOLFOX for 6 months.Timing for adjuvant chemotherapy: Recommend 6 to 8 weeks after surgery. Chemotherapy education was provided.  We had discussed the composition of chemotherapy regimen, length of chemo cycle, duration of treatment and the time to assess response to treatment.   # Chemotherapy education; patient was discussed with Dr. Celine Ahr for [facility]- port placement. Antiemetics-Zofran and Compazine; EMLA cream sent to pharmacy  #Weight loss likely secondary to decreased oral intake.  Refer to establish care with nutritionist #Wound infection, complete course of antibiotics.  Continue follow-up with surgery for wound VAC care #Severe iron deficiency anemia, Blood work today showed hemoglobin 8.3, ferritin 112, iron saturation 4. Discussed with patient about additional IV Venofer treatments. Allergy reactions/infusion  reaction including anaphylactic reaction discussed with patient. Other side effects include but not limited to high blood pressure, skin rash, weight gain, leg swelling, etc. Patient voices understanding and willing to proceed. Patient has previously received IV iron treatments and tolerated well. Plan IV Venofer weekly x2.  Patient will follow up with me in 3 weeks for additional discussion and assessment of her progress of recovery. Supportive care measures are necessary for patient well-being and will be provided as necessary. We spent sufficient time to discuss many aspect of care, questions were answered to patient's satisfaction.   Thank you for allowing me to participate in the care of this patient.   Earlie Server, MD, PhD Hematology Oncology [facility] Pager- [phone] 02/15/2020

## 2020-02-15 NOTE — Progress Notes (Signed)
Patient reports having an episode of feeling hot with sweats last night that was relieved with Tylenol and Motrin 5 hrs later.  Patient says her appetite is slowly improving despite an 11 lb wt loss.  Reports no pain.

## 2020-02-16 ENCOUNTER — Telehealth: Payer: Self-pay

## 2020-02-16 ENCOUNTER — Encounter: Payer: Self-pay | Admitting: General Surgery

## 2020-02-16 NOTE — Telephone Encounter (Signed)
-----   Message from Earlie Server, MD sent at 02/15/2020  5:22 PM EDT ----- Please arrange patient to have IV Venofer weekly x2. Schedule patient to see me-iron labs prior, in person MD +/-Venofer.  Thank you

## 2020-02-16 NOTE — Telephone Encounter (Signed)
Patient notified and AVS mailed out.

## 2020-02-16 NOTE — Telephone Encounter (Signed)
Done... All appts has been sched as requested 

## 2020-02-16 NOTE — Telephone Encounter (Signed)
Please arrange patient to have IV Venofer weekly x2 *new*.  Patient is scheduled for MD on 7/1 please add poss venofer with labs 2 days prior. Thank you  Will get Benjamine Mola to call patient with appt details.

## 2020-02-17 ENCOUNTER — Other Ambulatory Visit: Payer: Self-pay

## 2020-02-17 DIAGNOSIS — C189 Malignant neoplasm of colon, unspecified: Secondary | ICD-10-CM | POA: Insufficient documentation

## 2020-02-17 DIAGNOSIS — E039 Hypothyroidism, unspecified: Secondary | ICD-10-CM | POA: Insufficient documentation

## 2020-02-17 DIAGNOSIS — R197 Diarrhea, unspecified: Secondary | ICD-10-CM | POA: Insufficient documentation

## 2020-02-17 DIAGNOSIS — G8918 Other acute postprocedural pain: Secondary | ICD-10-CM | POA: Insufficient documentation

## 2020-02-17 DIAGNOSIS — Z79899 Other long term (current) drug therapy: Secondary | ICD-10-CM | POA: Insufficient documentation

## 2020-02-17 LAB — CBC
HCT: 25.5 % — ABNORMAL LOW (ref 36.0–46.0)
Hemoglobin: 8.3 g/dL — ABNORMAL LOW (ref 12.0–15.0)
MCH: 27.2 pg (ref 26.0–34.0)
MCHC: 32.5 g/dL (ref 30.0–36.0)
MCV: 83.6 fL (ref 80.0–100.0)
Platelets: 457 10*3/uL — ABNORMAL HIGH (ref 150–400)
RBC: 3.05 MIL/uL — ABNORMAL LOW (ref 3.87–5.11)
RDW: 15.2 % (ref 11.5–15.5)
WBC: 10.1 10*3/uL (ref 4.0–10.5)
nRBC: 0 % (ref 0.0–0.2)

## 2020-02-17 LAB — LACTIC ACID, PLASMA: Lactic Acid, Venous: 1.2 mmol/L (ref 0.5–1.9)

## 2020-02-17 LAB — COMPREHENSIVE METABOLIC PANEL
ALT: 30 U/L (ref 0–44)
AST: 51 U/L — ABNORMAL HIGH (ref 15–41)
Albumin: 2.4 g/dL — ABNORMAL LOW (ref 3.5–5.0)
Alkaline Phosphatase: 96 U/L (ref 38–126)
Anion gap: 8 (ref 5–15)
BUN: <5 mg/dL — ABNORMAL LOW (ref 6–20)
CO2: 25 mmol/L (ref 22–32)
Calcium: 8.2 mg/dL — ABNORMAL LOW (ref 8.9–10.3)
Chloride: 99 mmol/L (ref 98–111)
Creatinine, Ser: 0.54 mg/dL (ref 0.44–1.00)
GFR calc Af Amer: >60 mL/min (ref >60)
GFR calc non Af Amer: >60 mL/min (ref >60)
Glucose, Bld: 137 mg/dL — ABNORMAL HIGH (ref 70–99)
Potassium: 3.8 mmol/L (ref 3.5–5.1)
Sodium: 132 mmol/L — ABNORMAL LOW (ref 135–145)
Total Bilirubin: 0.5 mg/dL (ref 0.3–1.2)
Total Protein: 6.8 g/dL (ref 6.5–8.1)

## 2020-02-17 LAB — LIPASE, BLOOD: Lipase: 24 U/L (ref 11–51)

## 2020-02-17 MED ORDER — SODIUM CHLORIDE 0.9% FLUSH: 3.0000 mL | Freq: Once | INTRAVENOUS | Status: DC

## 2020-02-17 MED ORDER — SODIUM CHLORIDE 0.9 % IV BOLUS
1000.0000 mL | Freq: Once | INTRAVENOUS | Status: AC
  Administered 2020-02-17: 1000 mL via INTRAVENOUS

## 2020-02-17 NOTE — ED Triage Notes (Signed)
PT to ED with c/o high BP per her home nurse. PT took a pain pill.  Upon assessment, BP is 94/58. PT also states she was constipated, prescribed a stool softener, had a BM and continued to take stool softener and had diarrhea all night with an episode of chills. PT states she drank juice and water and feels a little better. PT has wound on stomach from and colectomy and this being cared for by home health nurse.

## 2020-02-18 ENCOUNTER — Emergency Department: Payer: Self-pay

## 2020-02-18 ENCOUNTER — Emergency Department
Admission: EM | Admit: 2020-02-18 | Discharge: 2020-02-18 | Disposition: A | Discharge status: Home / Self Care | Payer: Self-pay | Attending: Emergency Medicine | Admitting: Emergency Medicine

## 2020-02-18 DIAGNOSIS — R197 Diarrhea, unspecified: Secondary | ICD-10-CM

## 2020-02-18 LAB — PROCALCITONIN: Procalcitonin: <0.1 ng/mL

## 2020-02-18 LAB — LACTIC ACID, PLASMA: Lactic Acid, Venous: 0.9 mmol/L (ref 0.5–1.9)

## 2020-02-18 MED ORDER — LACTATED RINGERS IV BOLUS
1000.0000 mL | Freq: Once | INTRAVENOUS | Status: AC
  Administered 2020-02-18: 1000 mL via INTRAVENOUS

## 2020-02-18 MED ORDER — IOHEXOL 300 MG/ML  SOLN
100.0000 mL | Freq: Once | INTRAMUSCULAR | Status: AC | PRN
  Administered 2020-02-18: 100 mL via INTRAVENOUS

## 2020-02-18 NOTE — ED Provider Notes (Signed)
[facility] Emergency Department Provider Note  ____________________________________________   First MD Initiated Contact with Patient 02/18/20 [phone]     (approximate)  I have reviewed the triage vital signs and the nursing notes.   HISTORY  Chief Complaint Post-op Problem and Diarrhea  The patient and/or family speak(s) Spanish.  They understand they have the right to the use of a hospital interpreter, however at this time they prefer to speak directly with me in [place].  They know that they can ask for an interpreter at any time.   HPI Eirene Janely Gullickson is a 54 y.o. female with medical history as listed below which notably includes colon cancer and a recent partial colectomy (about 3 weeks ago by Dr. Celine Ahr).  She had a postoperative wound infection and presents tonight after her home health nurse suggested she come in given concerns of possible infection.  The patient reports that over the last 24 hours she has had a fever as high as 103 at home and she is continuing to have chills.  She took a stool softener because her home health nurse said that she was constipated but now she has been having multiple episodes of diarrhea.  She has some mild abdominal pain that is worse if she moves around but that has been essentially constant since the surgery.  She denies chest pain and shortness of breath and cough.  She has no sore throat.  She has some burning pain when she urinates.  Nothing in particular makes her symptoms better or worse and she is concerned about the possibility of infection.  She describes her symptoms overall as severe.         Past Medical History:  Diagnosis Date  . Anemia   . Hypothyroidism   . Thyroid disease     Patient Active Problem List   Diagnosis Date Noted  . Loss of weight 02/15/2020  . Goals of care, counseling/discussion 02/15/2020  . Colon cancer ([place]) 02/15/2020  . Pressure injury of skin 02/03/2020  . Bowel  obstruction ([place]) 01/29/2020  . Iron deficiency anemia due to chronic blood loss 11/22/2019  . COVID-19 virus detected 11/20/2019  . Symptomatic anemia 11/20/2019  . Hypothyroidism 11/20/2019  . Iron deficiency anemia 11/20/2019  . GIB (gastrointestinal bleeding) 11/20/2019    Past Surgical History:  Procedure Laterality Date  . COLONOSCOPY WITH PROPOFOL N/A 11/21/2019   Procedure: COLONOSCOPY WITH PROPOFOL;  Surgeon: Lin Landsman, MD;  Location: [facility] ENDOSCOPY;  Service: Gastroenterology;  Laterality: N/A;  . CYSTOSCOPY WITH STENT PLACEMENT Left 01/30/2020   Procedure: CYSTOSCOPY WITH STENT PLACEMENT;  Surgeon: Hollice Espy, MD;  Location: [facility] ORS;  Service: Urology;  Laterality: Left;  . ESOPHAGOGASTRODUODENOSCOPY (EGD) WITH PROPOFOL N/A 11/21/2019   Procedure: ESOPHAGOGASTRODUODENOSCOPY (EGD) WITH PROPOFOL;  Surgeon: Lin Landsman, MD;  Location: [facility] ENDOSCOPY;  Service: Gastroenterology;  Laterality: N/A;  . PARTIAL COLECTOMY N/A 01/30/2020   Procedure: PARTIAL COLECTOMY Sigmoid;  Surgeon: [name], MD;  Location: [facility] ORS;  Service: General;  Laterality: N/A;    Prior to Admission medications   Medication Sig Start Date End Date Taking? Authorizing Provider  amoxicillin-clavulanate (AUGMENTIN) 875-125 MG tablet Take 1 tablet by mouth 2 (two) times daily for 10 days. Patient not taking: Reported on 02/15/2020 02/14/20 02/24/20  Tylene Fantasia, PA-C  ferrous sulfate 325 (65 FE) MG tablet Take 1 tablet (325 mg total) by mouth 2 (two) times daily with a meal. Patient not taking: Reported on 01/11/2020 11/22/19  Fritzi Mandes, MD  ibuprofen (ADVIL) 800 MG tablet Take 1 tablet (800 mg total) by mouth every 8 (eight) hours as needed. 02/14/20   Tylene Fantasia, PA-C  levothyroxine (SYNTHROID) 137 MCG tablet Take 137 mcg by mouth daily before breakfast.     [provider]  oxyCODONE (OXY IR/ROXICODONE) 5 MG immediate release tablet Take 1 tablet (5 mg total) by  mouth every 6 (six) hours as needed for severe pain. Patient not taking: Reported on 02/15/2020 02/14/20   Tylene Fantasia, PA-C  senna-docusate (SENOKOT-S) 8.6-50 MG tablet Take 1 tablet by mouth at bedtime. Patient not taking: Reported on 02/15/2020 11/22/19   Fritzi Mandes, MD    Allergies Patient has no known allergies.  Family History  Problem Relation Age of Onset  . Diabetes Sister     Social History Social History   Tobacco Use  . Smoking status: Never Smoker  . Smokeless tobacco: Never Used  Vaping Use  . Vaping Use: Never used  Substance Use Topics  . Alcohol use: Never  . Drug use: Never    Review of Systems Constitutional: +fever/chills Eyes: No visual changes. ENT: No sore throat. Cardiovascular: Denies chest pain. Respiratory: Denies shortness of breath. Gastrointestinal: Mild generalized abdominal pain.  No nausea, no vomiting.  +diarrhea.   Genitourinary: No hematuria. Musculoskeletal: Negative for neck pain.  Negative for back pain. Integumentary: Negative for rash. Neurological: Negative for headaches, focal weakness or numbness.   ____________________________________________   PHYSICAL EXAM:  VITAL SIGNS: ED Triage Vitals  Enc Vitals Group     BP 02/17/20 1926 (!) 94/58     Pulse Rate 02/17/20 1926 98     Resp 02/17/20 1926 18     Temp 02/17/20 1926 98.2 F (36.8 C)     Temp Source 02/17/20 1926 Oral     SpO2 02/17/20 1926 98 %     Weight 02/17/20 1936 81.2 kg (179 lb)     Height 02/17/20 1936 1.626 m (5\' 4" )     Head Circumference --      Peak Flow --      Pain Score 02/17/20 1935 3     Pain Loc --      Pain Edu? --      Excl. in [place]? --     Constitutional: Alert and oriented.  Eyes: Conjunctivae are normal.  Head: Atraumatic. Nose: No congestion/rhinnorhea. Mouth/Throat: Patient is wearing a mask. Neck: No stridor.  No meningeal signs.   Cardiovascular: Normal rate, regular rhythm. Good peripheral circulation. Grossly normal heart  sounds. Respiratory: Normal respiratory effort.  No retractions. Gastrointestinal: Wound VAC is in place and the wound and the surgical wound above that generally are well-appearing.  Wound VAC was not removed for the assessment.  Patient has a nondistended abdomen and mild, diffuse, nonlocalized tenderness to palpation throughout the abdomen but without localized nor generalized peritonitis. Musculoskeletal: No lower extremity tenderness nor edema. No gross deformities of extremities. Neurologic:  Normal speech and language. No gross focal neurologic deficits are appreciated.  Skin:  Skin is warm, dry and intact. Psychiatric: Mood and affect are normal. Speech and behavior are normal.  ____________________________________________   LABS (all labs ordered are listed, but only abnormal results are displayed)  Labs Reviewed  COMPREHENSIVE METABOLIC PANEL - Abnormal; Notable for the following components:      Result Value   Sodium 132 (*)    Glucose, Bld 137 (*)    BUN <5 (*)    Calcium 8.2 (*)  Albumin 2.4 (*)    AST 51 (*)    All other components within normal limits  CBC - Abnormal; Notable for the following components:   RBC 3.05 (*)    Hemoglobin 8.3 (*)    HCT 25.5 (*)    Platelets 457 (*)    All other components within normal limits  LIPASE, BLOOD  LACTIC ACID, PLASMA  LACTIC ACID, PLASMA  PROCALCITONIN  URINALYSIS, COMPLETE (UACMP) WITH MICROSCOPIC   ____________________________________________  EKG  No indication for emergent EKG ____________________________________________  RADIOLOGY I, Hinda Kehr, personally viewed and evaluated these images (plain radiographs) as part of my medical decision making, as well as reviewing the written report by the radiologist.  ED MD interpretation: Fluid collection, possibly encapsulated, possibly getting bigger compared to prior.  Otherwise generally stable CT scan from about 12 days ago.  Official radiology report(s): CT  ABDOMEN PELVIS W CONTRAST  Result Date: 02/18/2020 CLINICAL DATA:  Diarrhea EXAM: CT ABDOMEN AND PELVIS WITH CONTRAST TECHNIQUE: Multidetector CT imaging of the abdomen and pelvis was performed using the standard protocol following bolus administration of intravenous contrast. CONTRAST:  145mL OMNIPAQUE IOHEXOL 300 MG/ML  SOLN COMPARISON:  None. FINDINGS: LOWER CHEST: Bibasilar atelectasis HEPATOBILIARY: Normal hepatic contours. No intra- or extrahepatic biliary dilatation. Normal gallbladder. PANCREAS: Normal pancreas. No ductal dilatation or peripancreatic fluid collection. SPLEEN: Normal. ADRENALS/URINARY TRACT: The adrenal glands are normal. No hydronephrosis, nephroureterolithiasis or solid renal mass. The urinary bladder is normal for degree of distention STOMACH/BOWEL: Postsurgical changes with mid abdominal colonic anastomosis. There is fluid in the right paracolic gutter and in the anterior midline abdomen, anterior to the anastomosis. The left colon has been resected. VASCULAR/LYMPHATIC: Normal course and caliber of the major abdominal vessels. No abdominal or pelvic lymphadenopathy. REPRODUCTIVE: Slightly increased size of fluid collection within the posterior cul-de-sac. MUSCULOSKELETAL. No bony spinal canal stenosis or focal osseous abnormality. OTHER: None. IMPRESSION: 1. Postsurgical changes with mid abdominal colonic anastomosis. There is fluid in the right paracolic gutter and at multiple locations throughout the abdomen, little changed from 02/07/20. 2. Slightly increased size of fluid collection within the posterior cul-de-sac, which is partially encapsulated. This could indicate an organizing seroma or developing abscess. 3. No free intraperitoneal air.  No evidence of obstruction. Electronically Signed   By: Ulyses Jarred M.D.   On: 02/18/2020 05:28   DG Chest Portable 1 View  Result Date: 02/18/2020 CLINICAL DATA:  Fever EXAM: PORTABLE CHEST 1 VIEW COMPARISON:  None. FINDINGS: The heart  size and mediastinal contours are within normal limits. Both lungs are clear. The visualized skeletal structures are unremarkable. Hyperdense foci project over the distal esophagus and left upper quadrant abdomen. IMPRESSION: 1. No active disease. 2. Hyperdense foci projecting over the distal esophagus and left upper quadrant abdomen may indicate ingested medication. Electronically Signed   By: Ulyses Jarred M.D.   On: 02/18/2020 05:09    ____________________________________________   PROCEDURES   Procedure(s) performed (including Critical Care):  Procedures   ____________________________________________   INITIAL IMPRESSION / MDM / [place] / ED COURSE  As part of my medical decision making, I reviewed the following data within the [place] notes reviewed and incorporated, Labs reviewed , Old chart reviewed, Discussed with surgeon (Dr. Celine Ahr) and reviewed Notes from prior ED visits   Differential diagnosis includes, but is not limited to, intra-abdominal infection, intra-abdominal abscess, urinary tract infection, obstructive uropathy, healthcare associated pneumonia.  Viral infection is also possible.  Patient waited for  a long time in the emergency department due to overwhelming patient volume.  Her vital signs have been stable and she has been afebrile here but she continues to have chills and she very clearly reports a fever as high as 103 at home.   No leukocytosis and first lactic acid was normal at 1.2.  I have ordered a repeat given that she has been waiting in the emergency department for an extended number of hours.  I also added on her procalcitonin.  Comprehensive metabolic panel is generally reassuring.  I reviewed the medical record including her last CT scan from less than 2 weeks ago and there was some concern at the time for developing intra-abdominal infection versus abscess.  Her vital signs have been afebrile and no tachycardia but  she has been consistently mildly hypertensive.  I have ordered 1 L of lactated Ringer's.  I am ordering a CT scan of the abdomen and pelvis with IV contrast and will reassess once the imaging is back.     Clinical Course as of Feb 17 646  Sat Feb 18, 2020  1540 Lactic Acid, Venous: 0.9 [CF]  0531 Procalcitonin: <0.10 [CF]  0602 Questionable worsening fluid collection on CT scan.  Paged Dr. Celine Ahr to discuss.  CT ABDOMEN PELVIS W CONTRAST [CF]  0867 I discussed the case by phone with Dr. Celine Ahr.  She knows the patient well and confirmed that she always has a low blood pressure, at least since the surgery.  She reviewed the CT scan and all of the lab work that I obtained and, based on my presentation of the patient and the way she currently looks and feels, she is comfortable with the patient following up in 2 days in clinic as scheduled.  I also feel this is appropriate.  I checked on the patient, who has been sleeping comfortably, and she says she feels fine, is having no pain and no nausea at this time, and is comfortable with the plan for discharge and outpatient follow-up.  I stressed to her the importance of following up as scheduled and she agrees.  I also gave my usual and customary return precautions.   [CF]  [phone] Of note, I was going to check a urinalysis, but the patient is already on antibiotics and I do not think that a urinalysis will make the difference in her disposition or diagnosis.   [CF]    Clinical Course User Index [CF] Hinda Kehr, MD     ____________________________________________  FINAL CLINICAL IMPRESSION(S) / ED DIAGNOSES  Final diagnoses:  Diarrhea, unspecified type     MEDICATIONS GIVEN DURING THIS VISIT:  Medications  sodium chloride flush (NS) 0.9 % injection 3 mL (3 mLs Intravenous Not Given 02/18/20 0317)  sodium chloride 0.9 % bolus 1,000 mL (0 mLs Intravenous Stopped 02/17/20 2316)  lactated ringers bolus 1,000 mL (0 mLs Intravenous Stopped 02/18/20  0643)  iohexol (OMNIPAQUE) 300 MG/ML solution 100 mL (100 mLs Intravenous Contrast Given 02/18/20 0458)     ED Discharge Orders    None      *Please note:  Shreya Laniqua Torrens was evaluated in Emergency Department on 02/18/2020 for the symptoms described in the history of present illness. She was evaluated in the context of the global COVID-19 pandemic, which necessitated consideration that the patient might be at risk for infection with the SARS-CoV-2 virus that causes COVID-19. Institutional protocols and algorithms that pertain to the evaluation of patients at risk for COVID-19 are in a state  of rapid change based on information released by regulatory bodies including the CDC and federal and state organizations. These policies and algorithms were followed during the patient's care in the ED.  Some ED evaluations and interventions may be delayed as a result of limited staffing during the pandemic.*  Note:  This document was prepared using Dragon voice recognition software and may include unintentional dictation errors.   Hinda Kehr, MD 02/18/20 [phone]

## 2020-02-18 NOTE — ED Notes (Signed)
Patient transported to CT 

## 2020-02-18 NOTE — ED Notes (Signed)
Pt back from CT at this time 

## 2020-02-18 NOTE — Discharge Instructions (Signed)
Your workup in the Emergency Department today was reassuring.  We did not find any specific abnormalities.  We recommend you drink plenty of fluids, take your regular medications and/or any new ones prescribed today, and follow up with the doctor(s) listed in these documents as recommended.  Return to the Emergency Department if you develop new or worsening symptoms that concern you.  

## 2020-02-20 ENCOUNTER — Ambulatory Visit (INDEPENDENT_AMBULATORY_CARE_PROVIDER_SITE_OTHER): Payer: Self-pay | Admitting: Physician Assistant

## 2020-02-20 ENCOUNTER — Other Ambulatory Visit: Payer: Self-pay

## 2020-02-20 ENCOUNTER — Encounter: Payer: Self-pay | Admitting: Physician Assistant

## 2020-02-20 VITALS — BP 108/71 | HR 94 | Temp 97.5°F | Resp 12 | Wt 179.0 lb

## 2020-02-20 DIAGNOSIS — Z09 Encounter for follow-up examination after completed treatment for conditions other than malignant neoplasm: Secondary | ICD-10-CM

## 2020-02-20 DIAGNOSIS — K6389 Other specified diseases of intestine: Secondary | ICD-10-CM

## 2020-02-20 NOTE — Progress Notes (Signed)
[facility] POST-OP OFFICE VISIT  02/20/2020  HPI: Debra [name] is a 54 y.o. 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 She was discharged home on 06/08 with Augmentin x10 days and wound vac to midline wound.  Since discharge, she has followed up with oncology (Dr Tasia Catchings) on 06/09 and recommended adjuvant chemotherapy once recovered. Additionally, she presented to the ED on 06/12 after her home health nursing staff had concerns over possible infection given fevers at home and diarrhea after taking stool softeners. Work up in the ED was reassuring however CT Abdomen/Pelvis concerning for persistent vs slightly enlarging intra-abdominal fluid collections concerning for possible abscess. EDP discussed case with Dr Celine Ahr, and the patient ultimately was feeling better and discharged home with plans for follow up today.   Today, she reports that she is feeling much better compared to her presentation in the ED. Her abdominal pain has improved. She still reports intermittent subjective fevers but is unable to measure her temperature. Her diarrhea is resolved and has stopped the stool softener. She did have an episode of nausea but believes this is related to spoiled milk. No issues at home with the wound vac and has been tolerating changes. She unfortunately did not bring her supplies with her this morning. No other new complaints this morning   Vital signs: BP 108/71   Pulse 94   Temp (!) 97.5 F (36.4 C)   Resp 12   Wt 179 lb (81.2 kg)   SpO2 98%   BMI 30.73 kg/m    Physical Exam: Constitutional: Well appearing female, NAD Abdomen: Soft, non-tender,non-distended Skin: Wound vac to the inferior portion of the wound, I am unable to assess the wound thoroughly as she did not bring  supplies for vac change. She has staples in place and these are covered by the wound vac dressing.   Assessment/Plan: This is a 54 y.o. 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 I was unable to preform dressing change or staple removal secondary to this. She has a scheduled home health nursing visit at 1530 today. I gave the patient's daughter staple removal supplies and will have them ask the home health RN to remove the staples at time of dressing change. If for some reason they are not comfortable doing so, I informed the patient and her daughter to call the office and schedule follow up with me for Wednesday, so I can do wound vac change and staple removal. They verbalized understanding of this and understand they need to bring wound vac supplies to each visit. I reviewed this plan numerous times to ensure understanding.   - I will also order CT guided percutaneous drainage as outpatient in attempt to drain intra-abdominal fluid collections as there is some concern these are developing abscesses which may explain her fevers. Our staff will schedule this  - She should continue ABx as prescribed; may need to extend this pending CT guided drainage  - Follow up with oncology as scheduled  - Pain control prn  - We will see her again in 1 week or so for reassessment, at which time CT guided drainage should be completed  --  [name], PA-C [facility] 02/20/2020, 11:26 AM [phone] M-F: 7am - 4pm

## 2020-02-20 NOTE — Patient Instructions (Signed)
Your CT Guided Drainage by Percutaneous Catheter is being scheduled. Once we have that appointment date we will contact you to let you know.   Keep taking the antibiotics until finished. Make sure when your wound nurse goes to your home to have her remove your staples. If they are not comfortable removing them give the office a call to get you scheduled for Wednesday. If you do come in please bring in your wound vac supplies.   Please see you appointment below. Call the office if you have any questions or concerns.    GENERAL POST-OPERATIVE PATIENT INSTRUCTIONS   WOUND CARE INSTRUCTIONS:  Keep a dry clean dressing on the wound if there is drainage. The initial bandage may be removed after 24 hours.  Once the wound has quit draining you may leave it open to air.  If clothing rubs against the wound or causes irritation and the wound is not draining you may cover it with a dry dressing during the daytime.  Try to keep the wound dry and avoid ointments on the wound unless directed to do so.  If the wound becomes bright red and painful or starts to drain infected material that is not clear, please contact your physician immediately.  If the wound is mildly pink and has a thick firm ridge underneath it, this is normal, and is referred to as a healing ridge.  This will resolve over the next 4-6 weeks.  BATHING: You may shower if you have been informed of this by your surgeon. However, Please do not submerge in a tub, hot tub, or pool until incisions are completely sealed or have been told by your surgeon that you may do so.  DIET:  You may eat any foods that you can tolerate.  It is a good idea to eat a high fiber diet and take in plenty of fluids to prevent constipation.  If you do become constipated you may want to take a mild laxative or take ducolax tablets on a daily basis until your bowel habits are regular.  Constipation can be very uncomfortable, along with straining, after recent  surgery.  ACTIVITY:  You are encouraged to cough and deep breath or use your incentive spirometer if you were given one, every 15-30 minutes when awake.  This will help prevent respiratory complications and low grade fevers post-operatively if you had a general anesthetic.  You may want to hug a pillow when coughing and sneezing to add additional support to the surgical area, if you had abdominal or chest surgery, which will decrease pain during these times.  You are encouraged to walk and engage in light activity for the next two weeks.  You should not lift more than 10 pounds, until 03/12/20 as it could put you at increased risk for complications.  Twenty pounds is roughly equivalent to a plastic bag of groceries. At that time- Listen to your body when lifting, if you have pain when lifting, stop and then try again in a few days. Soreness after doing exercises or activities of daily living is normal as you get back in to your normal routine.  MEDICATIONS:  Try to take narcotic medications and anti-inflammatory medications, such as tylenol, ibuprofen, naprosyn, etc., with food.  This will minimize stomach upset from the medication.  Should you develop nausea and vomiting from the pain medication, or develop a rash, please discontinue the medication and contact your physician.  You should not drive, make important decisions, or operate machinery when taking  narcotic pain medication.  SUNBLOCK Use sun block to incision area over the next year if this area will be exposed to sun. This helps decrease scarring and will allow you avoid a permanent darkened area over your incision.  QUESTIONS:  Please feel free to call our office if you have any questions, and we will be glad to assist you.

## 2020-02-21 ENCOUNTER — Ambulatory Visit: Payer: Self-pay | Admitting: Oncology

## 2020-02-21 NOTE — Patient Instructions (Signed)
Oxaliplatin Injection Qu es este medicamento? El OXALIPLATINO es un agente quimioteraputico. Este medicamento acta sobre las clulas que se dividen rpidamente, como las clulas cancerosas, y finalmente provoca la muerte de estas clulas. Se utiliza en el tratamiento del cncer de colon y recto y 38 tipos de cncer. Este medicamento puede ser utilizado para otros usos; si tiene alguna pregunta consulte con su proveedor de atencin mdica o con su farmacutico. MARCAS COMUNES: Eloxatin Qu le debo informar a mi profesional de la salud antes de tomar este medicamento? Necesita saber si usted presenta alguno de los siguientes problemas o situaciones:  enfermedad renal  una reaccin alrgica o inusual al oxaliplatino, a otros agentes quimioteraputicos, a otros medicamentos, alimentos, colorantes o conservantes  si est embarazada o buscando quedar embarazada  si est amamantando a un beb Cmo debo utilizar este medicamento? Este medicamento se administra mediante infusin por va intravenosa. Lo administra un profesional de la salud calificado en un hospital o en un entorno clnico. Hable con su pediatra para informarse acerca del uso de este medicamento en nios. Puede requerir atencin especial. Sobredosis: Pngase en contacto inmediatamente con un centro toxicolgico o una sala de urgencia si usted cree que haya tomado demasiado medicamento. ATENCIN: [employer] es solo para usted. No comparta este medicamento con nadie. Qu sucede si me olvido de una dosis? Es importante no olvidar ninguna dosis. Informe a su mdico o a su profesional de la salud si no puede asistir a Photographer. Qu puede interactuar con este medicamento?  medicamentos para incrementar los conteos sanguneos, tales como filgrastim, pegfilgrastim, sargramostim  probenecid  ciertos antibiticos, tales como amicacina, gentamicina, neomicina, polimixina B, estreptomicina, tobramicina  zalcitabina Consulte  a su mdico o a su profesional de la salud antes de tomar cualquiera de los siguientes medicamentos:  acetaminofeno  aspirina  ibuprofeno  quetoprofeno  naproxeno Puede ser que esta lista no menciona todas las posibles interacciones. Informe a su profesional de KB Home	[place] de [employer] productos a base de hierbas, medicamentos de [place] o suplementos nutritivos que est tomando. Si usted fuma, consume bebidas alcohlicas o si utiliza drogas ilegales, indqueselo tambin a su profesional de KB Home	[place]. Algunas sustancias pueden interactuar con su medicamento. A qu debo estar atento al usar [employer]? Se supervisar su condicin atentamente mientras reciba este medicamento. Tendr que hacerse anlisis de sangre peridicos mientras reciba este medicamento. Este medicamento puede aumentar su sensibilidad al fro. No tomar bebidas fras o usar hielo. Cubra la piel expuesta antes de estar en contacto con temperaturas fras u objetos fros. Mientras se encuentra afuera cuando haga fro use ropa [place] su boca y su nariz para calentar el aire que entra en sus pulmones. Informe a su mdico si experimenta sensibilidad al fro. Este medicamento puede hacerle sentir un Nurse, mental health. Esto es normal ya que la quimioterapia afecta tanto a las clulas sanas como a las clulas cancerosas. Si presenta alguno de los [employer], infrmelos. Sin embargo, contine con el tratamiento aun si se siente enfermo, a menos que su mdico le indique que lo suspenda. En algunos casos, podr recibir [employer] para ayudarle con los efectos secundarios. Siga las instrucciones para usarlos. Consulte a su mdico o a su profesional de la salud por asesoramiento si tiene fiebre, escalofros, dolor de garganta o cualquier otro sntoma de resfro o gripe. No se trate usted mismo. Este medicamento puede reducir la capacidad del cuerpo para combatir infecciones. Trate de no acercarse a IT sales professional  estn enfermas. [employer] puede aumentar el riesgo de magulladuras o sangrado. Consulte a su mdico o a su profesional de la salud si observa sangrados inusuales. Proceda con cuidado al cepillar sus dientes, usar hilo dental o Risk manager palillos para los dientes, ya que puede contraer una infeccin o Therapist, art con mayor facilidad. Si se somete a algn tratamiento dental, informe a su dentista que est [employer]. Evite tomar productos que contienen aspirina, acetaminofeno, ibuprofeno, naproxeno o quetoprofeno a menos que as lo indique su mdico. Estos productos pueden disimular la fiebre. No se debe quedar embarazada mientras reciba este medicamento. Las mujeres deben informar a su mdico si estn buscando quedar embarazadas o si creen que estn embarazadas. Existe la posibilidad de efectos secundarios graves a un beb sin nacer. Para ms informacin hable con su profesional de la salud o su farmacutico. No debe Economist a un beb mientras reciba este medicamento. Si tiene diarrea, llame a su mdico o a su profesional de KB Home	[place]. No se trate usted mismo. Qu efectos secundarios puedo tener al [employer] este medicamento? Efectos secundarios que debe informar a su mdico o a Barrister's clerk de la salud tan pronto como sea posible:  Chief of Staff como erupcin cutnea, picazn o urticarias, hinchazn de la cara, labios o lengua  conteos sanguneos bajos - este medicamento puede reducir la cantidad de glbulos blancos, glbulos rojos y plaquetas. Su riesgo de infeccin y [place].  signos de infeccin - fiebre o escalofros, tos, dolor de garganta, Social research officer, government o dificultad para orinar  signos de reduccin de plaquetas o sangrado - magulladuras, puntos rojos en la piel, heces de color oscuro o con aspecto alquitranado, sangrado por la nariz  signos de reduccin de glbulos rojos - cansancio o debilidad inusual, desmayos, sensacin de Technical sales engineer u opresin en el pecho  tos  diarrea  tensin en la mandbula  llagas en la boca  nuseas, vmito  dolor, hinchazn, enrojecimiento o irritacin en el lugar de la inyeccin  dolor, hormigueo, entumecimiento de manos o pies  problemas de coordinacin, del habla, al caminar  enrojecimiento, formacin de ampollas, descamacin o distensin de la piel, inclusive dentro de la boca  dificultad para orinar o cambios en el volumen de orina Efectos secundarios que, por lo general, no requieren atencin mdica (debe informarlos a su mdico o a su profesional de la salud si persisten o si son molestos):  cambios en la visin  estreimiento  cada del cabello  prdida del apetito  sabor metlico en la boca o cambios en el sentido del gusto  dolor estomacal Puede ser que esta lista no menciona todos los posibles efectos secundarios. Comunquese a su mdico por asesoramiento mdico [employer] Usted puede informar los efectos secundarios a la FDA por telfono al 1-800-FDA-1088. Dnde debo guardar mi medicina? Este medicamento se administra en hospitales o clnicas y no necesitar guardarlo en su domicilio. ATENCIN: Este folleto es un resumen. Puede ser que no cubra toda la posible informacin. Si usted tiene preguntas acerca de esta medicina, consulte con su mdico, su farmacutico o su profesional de Technical sales engineer.  2020 Elsevier/Gold Standard (2014-10-17 00:00:00) Fluorouracil, 5-FU injection Qu es este medicamento? El Lamar, 5-FU es un agente quimioteraputico. Este medicamento reduce el crecimiento de las clulas cancerosas. Se utiliza en el tratamiento de muchos tipos de cncer, incluyendo el cncer de mama, de colon y recto, pancretico y de Paramedic. Este medicamento puede ser utilizado para otros  usos; si tiene alguna pregunta consulte con su proveedor de atencin mdica o con su farmacutico. MARCAS COMUNES: Adrucil Qu le debo  informar a mi profesional de la salud antes de tomar este medicamento? Necesita saber si usted presenta alguno de los siguientes problemas o situaciones:  trastornos sanguneos  deficiencia de la dihidropirimidina deshidrogenasa (DPD)  infeccin (especialmente infecciones virales, como varicela o herpes)  enfermedad renal  enfermedad heptica  desnutrido, malnutricin  radioterapia reciente o continuada  una reaccin alrgica o inusual al fluorouracilo, a otros agentes quimioteraputicos, otros medicamentos, alimentos, colorantes o conservantes  si est embarazada o buscando quedar embarazada  si est amamantando a un beb Cmo debo utilizar este medicamento? Este medicamento se administra mediante inyeccin o infusin por va intravenosa. Lo administra un profesional de la salud calificado en un hospital o en un entorno clnico. Hable con su pediatra para informarse acerca del uso de este medicamento en nios. Puede requerir atencin especial. Sobredosis: Pngase en contacto inmediatamente con un centro toxicolgico o una sala de urgencia si usted cree que haya tomado demasiado medicamento. ATENCIN: [employer] es solo para usted. No comparta este medicamento con nadie. Qu sucede si me olvido de una dosis? Es importante no olvidar ninguna dosis. Informe a su mdico o a su profesional de la salud si no puede asistir a Photographer. Qu puede interactuar con este medicamento?  alopurinol  cimetidina  dapsona  digoxina  hidroxiurea  leucovorina  levamisol  medicamentos para convulsiones, tales como etotona, fosfenitona, fenitona  medicamentos para incrementar los conteos sanguneos, tales como filgrastim, pegfilgrastim, sargramostim  medicamentos que tratan o previenen cogulos sanguneos, tales como warfarina, enoxaparina y dalteparina  metotrexato  metronidazol  pirimetamina  otros agentes quimioteraputicos, tales como busulfn, cisplatino,  estramustina, vinblastina  trimetoprima  trimetrexato  vacunas Consulte a su mdico o a su profesional de la salud antes de tomar cualquiera de los siguientes medicamentos:  acetaminofeno  aspirina  ibuprofeno  quetoprofeno  naproxeno Puede ser que esta lista no menciona todas las posibles interacciones. Informe a su profesional de KB Home	[place] de [employer] productos a base de hierbas, medicamentos de [place] o suplementos nutritivos que est tomando. Si usted fuma, consume bebidas alcohlicas o si utiliza drogas ilegales, indqueselo tambin a su profesional de KB Home	[place]. Algunas sustancias pueden interactuar con su medicamento. A qu debo estar atento al usar [employer]? Visite a su mdico para chequear su evolucin peridicamente. Este medicamento puede hacerle sentir un Nurse, mental health. Esto es normal ya que la quimioterapia afecta tanto a las clulas sanas como a las clulas cancerosas. Si presenta alguno de los [employer], infrmelos. Sin embargo, contine con el tratamiento aun si se siente enfermo, a menos que su mdico le indique que lo suspenda. En algunos casos, podr recibir [employer] para ayudarle con los efectos secundarios. Siga las instrucciones para usarlos. Consulte a su mdico o a su profesional de la salud por asesoramiento si tiene fiebre, escalofros, dolor de garganta o cualquier otro sntoma de resfro o gripe. No se trate usted mismo. Este medicamento puede reducir la capacidad del cuerpo para combatir infecciones. Trate de no acercarse a personas que estn enfermas. [employer] puede aumentar el riesgo de magulladuras o sangrado. Consulte a su mdico o a su profesional de la salud si observa sangrados inusuales. Proceda con cuidado al cepillar sus dientes, usar hilo dental o Risk manager palillos para los dientes, ya que puede contraer una infeccin o Therapist, art con mayor facilidad. Si se somete a algn  tratamiento dental, informe a su  dentista que est [employer]. Evite tomar productos que contienen aspirina, acetaminofeno, ibuprofeno, naproxeno o quetoprofeno a menos que as lo indique su mdico. Estos productos pueden disimular la fiebre. No se debe quedar embarazada mientras recibe este medicamento. Las mujeres deben informar a su mdico si estn buscando quedar embarazadas o si creen que estn embarazadas. Existe la posibilidad de efectos secundarios graves a un beb sin nacer. Para ms informacin hable con su profesional de la salud o su farmacutico. No debe Economist a un beb mientras est usando este medicamento. Los hombres deben informar a su mdico si quieren tener nios. Este medicamento puede reducir el conteo de esperma. No trate la diarrea con productos de [employer]. Comunquese con su mdico si tiene diarrea que dura ms de 2 das o si es severa y [place]. Este medicamento puede aumentar la sensibilidad al sol. Mantngase fuera de Administrator. Si no lo puede evitar, utilice ropa protectora y crema de Photographer. No utilice lmparas solares, camas solares ni cabinas solares. Qu efectos secundarios puedo tener al [employer] este medicamento? Efectos secundarios que debe informar a su mdico o a Barrister's clerk de la salud tan pronto como sea posible:  Chief of Staff como erupcin cutnea, picazn o urticarias, hinchazn de la cara, labios o lengua  conteos sanguneos bajos - este medicamento puede reducir la cantidad de glbulos blancos, glbulos rojos y plaquetas. Su riesgo de infeccin y [place].  signos de infeccin - fiebre o escalofros, tos, dolor de garganta, Social research officer, government o dificultad para orinar  signos de reduccin de plaquetas o sangrado - magulladuras, puntos rojos en la piel, heces de color oscuro o con aspecto alquitranado, sangre en la orina  signos de reduccin de glbulos rojos - cansancio o debilidad inusual, desmayos, sensacin de mareo  problemas  respiratorios  cambios en la visin  dolor en el pecho  llagas en la boca  nuseas, vmito  dolor, hinchazn, enrojecimiento en el lugar de la inyeccin  hormigueo, dolor, entumecimiento de manos o pies  enrojecimiento, hinchazn o llagas en las manos o pies  dolor estomacal  sangrado inusuales Efectos secundarios que, por lo general, no requieren atencin mdica (debe informarlos a su mdico o a su profesional de la salud si persisten o si son molestos):  cambios en las uas de las manos o pies  diarrea  picazn o sequedad de la piel  cada del cabello  dolor de cabeza  prdida del apetito  sensibilidad de los ojos a la luz  Tree surgeon estomacal  ojos inusualmente llorosos Puede ser que esta lista no menciona todos los posibles efectos secundarios. Comunquese a su mdico por asesoramiento mdico [employer] Usted puede informar los efectos secundarios a la FDA por telfono al 1-800-FDA-1088. Dnde debo guardar mi medicina? Este medicamento se administra en hospitales o clnicas y no necesitar guardarlo en su domicilio. ATENCIN: Este folleto es un resumen. Puede ser que no cubra toda la posible informacin. Si usted tiene preguntas acerca de esta medicina, consulte con su mdico, su farmacutico o su profesional de Technical sales engineer.  2020 Elsevier/Gold Standard (2014-10-17 00:00:00) Leucovorin injection Qu es este medicamento? La LEUCOVORINA se [place] para prevenir o tratar [employer] nocivos de ciertos medicamentos. Este medicamento tambin sirve para tratar la anemia provocada por una nivel bajo de cido flico en el cuerpo. Tambin se puede administrar con 5-fluorouracilo (5-FU), para tratar el cncer de colon. Este medicamento puede ser utilizado para  otros usos; si tiene alguna pregunta consulte con su proveedor de atencin mdica o con su farmacutico. Qu le debo informar a mi profesional de la salud antes de tomar este medicamento? Necesita  saber si usted presenta alguno de los siguientes problemas o situaciones:  anemia debido a bajos niveles de vitamina B-12 en la sangre  una reaccin alrgica o inusual a la leucovorina, cido flico, a otros medicamentos, alimentos, colorantes o conservantes  si est embarazada o buscando quedar embarazada  si est amamantando a un beb Cmo debo utilizar este medicamento? Este medicamento se administra mediante inyeccin por va intramuscular o intravenosa. Lo administra un profesional de Technical sales engineer en un hospital o en un entorno clnico. Hable con su pediatra para informarse acerca del uso de este medicamento en nios. Puede requerir atencin especial. Sobredosis: Pngase en contacto inmediatamente con un centro toxicolgico o una sala de urgencia si usted cree que haya tomado demasiado medicamento. ATENCIN: [employer] es solo para usted. No comparta este medicamento con nadie. Qu sucede si me olvido de una dosis? No se aplica en este caso. Qu puede interactuar con este medicamento?  capecitabina  fluorouracilo  fenobarbital  fenitona  primidona  trimetoprima- sulfametoxasol Puede ser que esta lista no menciona todas las posibles interacciones. Informe a su profesional de KB Home	[place] de [employer] productos a base de hierbas, medicamentos de [place] o suplementos nutritivos que est tomando. Si usted fuma, consume bebidas alcohlicas o si utiliza drogas ilegales, indqueselo tambin a su profesional de KB Home	[place]. Algunas sustancias pueden interactuar con su medicamento. A qu debo estar atento al usar Coca[employer]? Se supervisar su estado de salud atentamente mientras reciba este medicamento. Este medicamento puede aumentar los efectos secundarios de 5-fluorouracilo, 5-FU. Si tiene diarrea o [employer] boca que no mejoran o que [place], consulte a su mdico o a su profesional de KB Home	[place]. Qu efectos secundarios puedo tener al [employer] este medicamento? Efectos  secundarios que debe informar a su mdico o a Barrister's clerk de la salud tan pronto como sea posible:  Chief of Staff como erupcin cutnea, picazn o urticarias, hinchazn de la cara, labios o lengua  problemas respiratorios  fiebre, infeccin  llagas en la boca  sangrado, magulladuras inusuales  cansancio o debilidad inusual Efectos secundarios que, por lo general, no requieren atencin mdica (debe informarlos a su mdico o a su profesional de la salud si persisten o si son molestos):  estreimiento o diarrea  prdida del apetito  nuseas, vmito Puede ser que esta lista no menciona todos los posibles efectos secundarios. Comunquese a su mdico por asesoramiento mdico [employer] Usted puede informar los efectos secundarios a la FDA por telfono al 1-800-FDA-1088. Dnde debo guardar mi medicina? Este medicamento se administra en hospitales o clnicas y no necesitar guardarlo en su domicilio. ATENCIN: Este folleto es un resumen. Puede ser que no cubra toda la posible informacin. Si usted tiene preguntas acerca de esta medicina, consulte con su mdico, su farmacutico o su profesional de Technical sales engineer.  2020 Elsevier/Gold Standard (2014-10-17 00:00:00) Oxaliplatin Injection What is this medicine? OXALIPLATIN (ox AL i PLA tin) is a chemotherapy drug. It targets fast dividing cells, like cancer cells, and causes these cells to die. This medicine is used to treat cancers of the colon and rectum, and many other cancers. This medicine may be used for other purposes; ask your health care provider or pharmacist if you have questions. COMMON BRAND NAME(S): Eloxatin What should I tell my  health care provider before I take this medicine? They need to know if you have any of these conditions:  heart disease  history of irregular heartbeat  liver disease  low blood counts, like white cells, platelets, or red blood cells  lung or breathing disease, like  asthma  take medicines that treat or prevent blood clots  tingling of the fingers or toes, or other nerve disorder  an unusual or allergic reaction to oxaliplatin, other chemotherapy, other medicines, foods, dyes, or preservatives  pregnant or trying to get pregnant  breast-feeding How should I use this medicine? This drug is given as an infusion into a vein. It is administered in a hospital or clinic by a specially trained health care professional. Talk to your pediatrician regarding the use of this medicine in children. Special care may be needed. Overdosage: If you think you have taken too much of this medicine contact a poison control center or emergency room at once. NOTE: This medicine is only for you. Do not share this medicine with others. What if I miss a dose? It is important not to miss a dose. Call your doctor or health care professional if you are unable to keep an appointment. What may interact with this medicine? Do not take this medicine with any of the following medications:  cisapride  dronedarone  pimozide  thioridazine This medicine may also interact with the following medications:  aspirin and aspirin-like medicines  certain medicines that treat or prevent blood clots like warfarin, apixaban, dabigatran, and rivaroxaban  cisplatin  cyclosporine  diuretics  medicines for infection like acyclovir, adefovir, amphotericin B, bacitracin, cidofovir, foscarnet, ganciclovir, gentamicin, pentamidine, vancomycin  NSAIDs, medicines for pain and inflammation, like ibuprofen or naproxen  other medicines that prolong the QT interval (an abnormal heart rhythm)  pamidronate  zoledronic acid This list may not describe all possible interactions. Give your health care provider a list of all the medicines, herbs, non-prescription drugs, or dietary supplements you use. Also tell them if you smoke, drink alcohol, or use illegal drugs. Some items may interact with your  medicine. What should I watch for while using this medicine? Your condition will be monitored carefully while you are receiving this medicine. You may need blood work done while you are taking this medicine. This medicine may make you feel generally unwell. This is not uncommon as chemotherapy can affect healthy cells as well as cancer cells. Report any side effects. Continue your course of treatment even though you feel ill unless your healthcare professional tells you to stop. This medicine can make you more sensitive to cold. Do not drink cold drinks or use ice. Cover exposed skin before coming in contact with cold temperatures or cold objects. When out in cold weather wear warm clothing and cover your mouth and nose to warm the air that goes into your lungs. Tell your doctor if you get sensitive to the cold. Do not become pregnant while taking this medicine or for 9 months after stopping it. Women should inform their health care professional if they wish to become pregnant or think they might be pregnant. Men should not father a child while taking this medicine and for 6 months after stopping it. There is potential for serious side effects to an unborn child. Talk to your health care professional for more information. Do not breast-feed a child while taking this medicine or for 3 months after stopping it. This medicine has caused ovarian failure in some women. This medicine may make  it more difficult to get pregnant. Talk to your health care professional if you are concerned about your fertility. This medicine has caused decreased sperm counts in some men. This may make it more difficult to father a child. Talk to your health care professional if you are concerned about your fertility. This medicine may increase your risk of getting an infection. Call your health care professional for advice if you get a fever, chills, or sore throat, or other symptoms of a cold or flu. Do not treat yourself. Try to  avoid being around people who are sick. Avoid taking medicines that contain aspirin, acetaminophen, ibuprofen, naproxen, or ketoprofen unless instructed by your health care professional. These medicines may hide a fever. Be careful brushing or flossing your teeth or using a toothpick because you may get an infection or bleed more easily. If you have any dental work done, tell your dentist you are receiving this medicine. What side effects may I notice from receiving this medicine? Side effects that you should report to your doctor or health care professional as soon as possible:  allergic reactions like skin rash, itching or hives, swelling of the face, lips, or tongue  breathing problems  cough  low blood counts - this medicine may decrease the number of white blood cells, red blood cells, and platelets. You may be at increased risk for infections and bleeding  nausea, vomiting  pain, redness, or irritation at site where injected  pain, tingling, numbness in the hands or feet  signs and symptoms of bleeding such as bloody or black, tarry stools; red or dark brown urine; spitting up blood or brown material that looks like coffee grounds; red spots on the skin; unusual bruising or bleeding from the eyes, gums, or nose  signs and symptoms of a dangerous change in heartbeat or heart rhythm like chest pain; dizziness; fast, irregular heartbeat; palpitations; feeling faint or lightheaded; falls  signs and symptoms of infection like fever; chills; cough; sore throat; pain or trouble passing urine  signs and symptoms of liver injury like dark yellow or brown urine; general ill feeling or flu-like symptoms; light-colored stools; loss of appetite; nausea; right upper belly pain; unusually weak or tired; yellowing of the eyes or skin  signs and symptoms of low red blood cells or anemia such as unusually weak or tired; feeling faint or lightheaded; falls  signs and symptoms of muscle injury like  dark urine; trouble passing urine or change in the amount of urine; unusually weak or tired; muscle pain; back pain Side effects that usually do not require medical attention (report to your doctor or health care professional if they continue or are bothersome):  changes in taste  diarrhea  gas  hair loss  loss of appetite  mouth sores This list may not describe all possible side effects. Call your doctor for medical advice about side effects. You may report side effects to FDA at 1-800-FDA-1088. Where should I keep my medicine? This drug is given in a hospital or clinic and will not be stored at home. NOTE: This sheet is a summary. It may not cover all possible information. If you have questions about this medicine, talk to your doctor, pharmacist, or health care provider.  2020 Elsevier/Gold Standard (2019-01-12 12:20:35) Fluorouracil, 5-FU injection What is this medicine? FLUOROURACIL, 5-FU (flure oh YOOR a sil) is a chemotherapy drug. It slows the growth of cancer cells. This medicine is used to treat many types of cancer like breast cancer, colon  or rectal cancer, pancreatic cancer, and stomach cancer. This medicine may be used for other purposes; ask your health care provider or pharmacist if you have questions. COMMON BRAND NAME(S): Adrucil What should I tell my health care provider before I take this medicine? They need to know if you have any of these conditions:  blood disorders  dihydropyrimidine dehydrogenase (DPD) deficiency  infection (especially a virus infection such as chickenpox, cold sores, or herpes)  kidney disease  liver disease  malnourished, poor nutrition  recent or ongoing radiation therapy  an unusual or allergic reaction to fluorouracil, other chemotherapy, other medicines, foods, dyes, or preservatives  pregnant or trying to get pregnant  breast-feeding How should I use this medicine? This drug is given as an infusion or injection into a  vein. It is administered in a hospital or clinic by a specially trained health care professional. Talk to your pediatrician regarding the use of this medicine in children. Special care may be needed. Overdosage: If you think you have taken too much of this medicine contact a poison control center or emergency room at once. NOTE: This medicine is only for you. Do not share this medicine with others. What if I miss a dose? It is important not to miss your dose. Call your doctor or health care professional if you are unable to keep an appointment. What may interact with this medicine?  allopurinol  cimetidine  dapsone  digoxin  hydroxyurea  leucovorin  levamisole  medicines for seizures like ethotoin, fosphenytoin, phenytoin  medicines to increase blood counts like filgrastim, pegfilgrastim, sargramostim  medicines that treat or prevent blood clots like warfarin, enoxaparin, and dalteparin  methotrexate  metronidazole  pyrimethamine  some other chemotherapy drugs like busulfan, cisplatin, estramustine, vinblastine  trimethoprim  trimetrexate  vaccines Talk to your doctor or health care professional before taking any of these medicines:  acetaminophen  aspirin  ibuprofen  ketoprofen  naproxen This list may not describe all possible interactions. Give your health care provider a list of all the medicines, herbs, non-prescription drugs, or dietary supplements you use. Also tell them if you smoke, drink alcohol, or use illegal drugs. Some items may interact with your medicine. What should I watch for while using this medicine? Visit your doctor for checks on your progress. This drug may make you feel generally unwell. This is not uncommon, as chemotherapy can affect healthy cells as well as cancer cells. Report any side effects. Continue your course of treatment even though you feel ill unless your doctor tells you to stop. In some cases, you may be given additional  medicines to help with side effects. Follow all directions for their use. Call your doctor or health care professional for advice if you get a fever, chills or sore throat, or other symptoms of a cold or flu. Do not treat yourself. This drug decreases your body's ability to fight infections. Try to avoid being around people who are sick. This medicine may increase your risk to bruise or bleed. Call your doctor or health care professional if you notice any unusual bleeding. Be careful brushing and flossing your teeth or using a toothpick because you may get an infection or bleed more easily. If you have any dental work done, tell your dentist you are receiving this medicine. Avoid taking products that contain aspirin, acetaminophen, ibuprofen, naproxen, or ketoprofen unless instructed by your doctor. These medicines may hide a fever. Do not become pregnant while taking this medicine. Women should inform their doctor  if they wish to become pregnant or think they might be pregnant. There is a potential for serious side effects to an unborn child. Talk to your health care professional or pharmacist for more information. Do not breast-feed an infant while taking this medicine. Men should inform their doctor if they wish to father a child. This medicine may lower sperm counts. Do not treat diarrhea with over the counter products. Contact your doctor if you have diarrhea that lasts more than 2 days or if it is severe and watery. This medicine can make you more sensitive to the sun. Keep out of the sun. If you cannot avoid being in the sun, wear protective clothing and use sunscreen. Do not use sun lamps or tanning beds/booths. What side effects may I notice from receiving this medicine? Side effects that you should report to your doctor or health care professional as soon as possible:  allergic reactions like skin rash, itching or hives, swelling of the face, lips, or tongue  low blood counts - this medicine  may decrease the number of white blood cells, red blood cells and platelets. You may be at increased risk for infections and bleeding.  signs of infection - fever or chills, cough, sore throat, pain or difficulty passing urine  signs of decreased platelets or bleeding - bruising, pinpoint red spots on the skin, black, tarry stools, blood in the urine  signs of decreased red blood cells - unusually weak or tired, fainting spells, lightheadedness  breathing problems  changes in vision  chest pain  mouth sores  nausea and vomiting  pain, swelling, redness at site where injected  pain, tingling, numbness in the hands or feet  redness, swelling, or sores on hands or feet  stomach pain  unusual bleeding Side effects that usually do not require medical attention (report to your doctor or health care professional if they continue or are bothersome):  changes in finger or toe nails  diarrhea  dry or itchy skin  hair loss  headache  loss of appetite  sensitivity of eyes to the light  stomach upset  unusually teary eyes This list may not describe all possible side effects. Call your doctor for medical advice about side effects. You may report side effects to FDA at 1-800-FDA-1088. Where should I keep my medicine? This drug is given in a hospital or clinic and will not be stored at home. NOTE: This sheet is a summary. It may not cover all possible information. If you have questions about this medicine, talk to your doctor, pharmacist, or health care provider.  2020 Elsevier/Gold Standard (2007-12-29 13:53:16) Leucovorin injection What is this medicine? LEUCOVORIN (loo koe VOR in) is used to prevent or treat the harmful effects of some medicines. This medicine is used to treat anemia caused by a low amount of folic acid in the body. It is also used with 5-fluorouracil (5-FU) to treat colon cancer. This medicine may be used for other purposes; ask your health care provider or  pharmacist if you have questions. What should I tell my health care provider before I take this medicine? They need to know if you have any of these conditions:  anemia from low levels of vitamin B-12 in the blood  an unusual or allergic reaction to leucovorin, folic acid, other medicines, foods, dyes, or preservatives  pregnant or trying to get pregnant  breast-feeding How should I use this medicine? This medicine is for injection into a muscle or into a vein. It is  given by a health care professional in a hospital or clinic setting. Talk to your pediatrician regarding the use of this medicine in children. Special care may be needed. Overdosage: If you think you have taken too much of this medicine contact a poison control center or emergency room at once. NOTE: This medicine is only for you. Do not share this medicine with others. What if I miss a dose? This does not apply. What may interact with this medicine?  capecitabine  fluorouracil  phenobarbital  phenytoin  primidone  trimethoprim-sulfamethoxazole This list may not describe all possible interactions. Give your health care provider a list of all the medicines, herbs, non-prescription drugs, or dietary supplements you use. Also tell them if you smoke, drink alcohol, or use illegal drugs. Some items may interact with your medicine. What should I watch for while using this medicine? Your condition will be monitored carefully while you are receiving this medicine. This medicine may increase the side effects of 5-fluorouracil, 5-FU. Tell your doctor or health care professional if you have diarrhea or mouth sores that do not get better or that get worse. What side effects may I notice from receiving this medicine? Side effects that you should report to your doctor or health care professional as soon as possible:  allergic reactions like skin rash, itching or hives, swelling of the face, lips, or tongue  breathing  problems  fever, infection  mouth sores  unusual bleeding or bruising  unusually weak or tired Side effects that usually do not require medical attention (report to your doctor or health care professional if they continue or are bothersome):  constipation or diarrhea  loss of appetite  nausea, vomiting This list may not describe all possible side effects. Call your doctor for medical advice about side effects. You may report side effects to FDA at 1-800-FDA-1088. Where should I keep my medicine? This drug is given in a hospital or clinic and will not be stored at home. NOTE: This sheet is a summary. It may not cover all possible information. If you have questions about this medicine, talk to your doctor, pharmacist, or health care provider.  2020 Elsevier/Gold Standard (2008-02-29 16:50:29)

## 2020-02-22 ENCOUNTER — Inpatient Hospital Stay: Payer: Self-pay

## 2020-02-22 ENCOUNTER — Encounter: Payer: Self-pay | Admitting: Physician Assistant

## 2020-02-22 ENCOUNTER — Ambulatory Visit (INDEPENDENT_AMBULATORY_CARE_PROVIDER_SITE_OTHER): Payer: Self-pay | Admitting: Physician Assistant

## 2020-02-22 ENCOUNTER — Other Ambulatory Visit: Payer: Self-pay

## 2020-02-22 VITALS — BP 101/71 | HR 102 | Temp 97.5°F | Resp 12 | Ht 59.0 in | Wt 181.0 lb

## 2020-02-22 DIAGNOSIS — Z09 Encounter for follow-up examination after completed treatment for conditions other than malignant neoplasm: Secondary | ICD-10-CM

## 2020-02-22 DIAGNOSIS — K6389 Other specified diseases of intestine: Secondary | ICD-10-CM

## 2020-02-22 NOTE — Patient Instructions (Signed)
Your staples were removed today and your wound vac was changed. Keep your wound vac changes as scheduled and keep your appointment for next Thursday with Dr Celine Ahr.   Take Motrin and Tylenol for fevers as needed. Please purchase a thermometer to check your temperature. Keep note of it if you have high fevers please contact the office.   Le quitaron las grapas hoy y le cambiaron la aspiradora de heridas. Mantenga los cambios de la vacuna para heridas segn lo programado y cumpla con su cita para el prximo jueves con el Dr. Celine Ahr.  Tome Motrin y Tylenol para la fiebre segn sea necesario. Compre un termmetro para Engineer, mining. Tome nota de ello si tiene fiebre alta, comunquese con la oficina.

## 2020-02-22 NOTE — Progress Notes (Signed)
[facility] POST-OP OFFICE VISIT  02/22/2020  HPI: Debra [name] is a 54 y.o. 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 She was discharged home on 06/08 with Augmentin x10 days and wound vac to midline wound.  She presents today for wound vac change and staple removal.   Vital signs: BP 101/71   Pulse (!) 102   Temp (!) 97.5 F (36.4 C)   Resp 12   Ht 4\' 11"  (1.499 m)   Wt 181 lb (82.1 kg)   SpO2 99%   BMI 36.56 kg/m    Physical Exam: Constitutional: Well appearing female, NAD Abdomen: Soft, non-tender, non-distended.  Skin: Inferior portion of the wound with healthy granulation tissue in wound bed, healing well. No erythema or purulence. Staples removed from superior portion of the laparotomy incision, which is well healed.   Assessment/Plan: This is a 54 y.o. 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     - I had previous discussed CT guided percutaneous drainage at her last visit on 06/14. Since that time I have spoken with IR (Dr Pascal Lux) and reviewed the films and patient case with him. She has had clinical improvement since presentation to the ED on 06/12, and the decision was made to follow clinically at this time. If she were to worsen then we would likely need admission for IV Abx and percutaneous drainage. Additionally, we could consider re-imaging in a week or so to reassess these fluid collections. I have encouraged the patient and her daughter to obtain a thermometer for home to monitor for fevers. They  are understanding of this plan.    - rtc as scheduled with Dr Celine Ahr.   -- [name], PA-C [facility] 02/22/2020, 2:09 PM [phone] M-F: 7am - 4pm

## 2020-02-23 ENCOUNTER — Inpatient Hospital Stay: Payer: Self-pay

## 2020-02-28 ENCOUNTER — Encounter: Payer: Self-pay | Admitting: Pharmacy Technician

## 2020-02-28 NOTE — Progress Notes (Signed)
Patient has been approved for drug assistance by [employer] for Venofer. The enrollment period is from 02/22/20-02/20/21 based on self pay. First DOS covered is 03/01/20.

## 2020-03-01 ENCOUNTER — Other Ambulatory Visit: Payer: Self-pay

## 2020-03-01 ENCOUNTER — Ambulatory Visit (INDEPENDENT_AMBULATORY_CARE_PROVIDER_SITE_OTHER): Payer: Self-pay | Admitting: General Surgery

## 2020-03-01 ENCOUNTER — Inpatient Hospital Stay: Payer: Self-pay

## 2020-03-01 ENCOUNTER — Encounter: Payer: Self-pay | Admitting: General Surgery

## 2020-03-01 VITALS — BP 103/56 | HR 90 | Temp 98.1°F | Resp 20

## 2020-03-01 VITALS — BP 116/80 | HR 111 | Temp 97.9°F | Resp 15 | Ht 59.0 in | Wt 179.0 lb

## 2020-03-01 DIAGNOSIS — C187 Malignant neoplasm of sigmoid colon: Secondary | ICD-10-CM

## 2020-03-01 DIAGNOSIS — D500 Iron deficiency anemia secondary to blood loss (chronic): Secondary | ICD-10-CM

## 2020-03-01 MED ORDER — IRON SUCROSE 20 MG/ML IV SOLN
200.0000 mg | Freq: Once | INTRAVENOUS | Status: AC
  Administered 2020-03-01: 200 mg via INTRAVENOUS
  Filled 2020-03-01: qty 10

## 2020-03-01 MED ORDER — SODIUM CHLORIDE 0.9 % IV SOLN
Freq: Once | INTRAVENOUS | Status: AC
  Filled 2020-03-01: qty 250

## 2020-03-01 MED ORDER — SODIUM CHLORIDE 0.9 % IV SOLN: 200.0000 mg | Freq: Once | INTRAVENOUS | Status: DC

## 2020-03-01 NOTE — Progress Notes (Signed)
Debra [name] is here today for a postoperative visit. She is a 54 year old woman who had a near obstructing colon mass. She underwent sigmoid colectomy with primary anastomosis. Postsurgically, she did develop a wound infection that was opened and is now being managed with wound VAC. She was seen in clinic by our physician's assistant twice last week. She also saw Dr. Tasia Catchings in oncology on June 9. Dr. Tasia Catchings plans a 65-month regimen of FOLFOX adjuvant chemotherapy. She would like to start this 6 to 8 weeks after surgery and has discussed placement of a port with me. Debra [name] has also attended chemotherapy education in anticipation of receiving adjuvant therapy.  Today, Debra [name] reports that her wound VAC changes are going well, but she does experience some pain immediately after the dressing is done. She endorses fatigue and decreased appetite, but her family states that they are getting her some protein drinks to try and improve her oral intake.  She is scheduled for a Venofer Venofer infusion this afternoon.  Today's Vitals   03/01/20 0956  BP: 116/80  Pulse: (!) 111  Resp: 15  Temp: 97.9 F (36.6 C)  SpO2: 99%  Weight: 179 lb (81.2 kg)  Height: 4\' 11"  (1.499 m)   Body mass index is 36.15 kg/m. Focused abdominal exam demonstrates that the wound VAC is intact over a fairly small area at this time.  As it was just changed yesterday, I did not change it today.  The remainder have her incision has healed nicely.  She has only a limited amount of tenderness surrounding the VAC site.  Impression and plan: This is a 54 year old woman who is now 4 weeks out from a sigmoid colectomy for colon cancer.  She will need adjuvant chemotherapy and port placement.  Ideally, we would like to get her midline and healed completely prior to initiation of chemotherapy.  She was encouraged to increase her oral intake, particularly of protein, to aid in wound healing. We will see her back in our clinic in about 2  weeks.  I will be away, but our PA will be available for her visit. If she continues to have a poor appetite, we may need to initiate an appetite stimulant, such as Megace or Marinol.

## 2020-03-01 NOTE — Patient Instructions (Addendum)
We will have you follow up here in 2 weeks Continue to increase your fluid intake Continue to try to increase you protein intake with shakes and diet.    Please call and ask to speak with a nurse if you develop questions or concerns.

## 2020-03-05 ENCOUNTER — Other Ambulatory Visit: Payer: Self-pay

## 2020-03-05 DIAGNOSIS — D500 Iron deficiency anemia secondary to blood loss (chronic): Secondary | ICD-10-CM

## 2020-03-06 ENCOUNTER — Other Ambulatory Visit: Payer: Self-pay

## 2020-03-06 ENCOUNTER — Telehealth: Payer: Self-pay | Admitting: Emergency Medicine

## 2020-03-06 ENCOUNTER — Inpatient Hospital Stay: Payer: Self-pay

## 2020-03-06 DIAGNOSIS — D500 Iron deficiency anemia secondary to blood loss (chronic): Secondary | ICD-10-CM

## 2020-03-06 LAB — CBC WITH DIFFERENTIAL/PLATELET
Abs Immature Granulocytes: 0.05 10*3/uL (ref 0.00–0.07)
Basophils Absolute: 0 10*3/uL (ref 0.0–0.1)
Basophils Relative: 0 %
Eosinophils Absolute: 0 10*3/uL (ref 0.0–0.5)
Eosinophils Relative: 0 %
HCT: 29.4 % — ABNORMAL LOW (ref 36.0–46.0)
Hemoglobin: 9.4 g/dL — ABNORMAL LOW (ref 12.0–15.0)
Immature Granulocytes: 1 %
Lymphocytes Relative: 10 %
Lymphs Abs: 0.9 10*3/uL (ref 0.7–4.0)
MCH: 26.6 pg (ref 26.0–34.0)
MCHC: 32 g/dL (ref 30.0–36.0)
MCV: 83.3 fL (ref 80.0–100.0)
Monocytes Absolute: 0.9 10*3/uL (ref 0.1–1.0)
Monocytes Relative: 10 %
Neutro Abs: 7 10*3/uL (ref 1.7–7.7)
Neutrophils Relative %: 79 %
Platelets: 255 10*3/uL (ref 150–400)
RBC: 3.53 MIL/uL — ABNORMAL LOW (ref 3.87–5.11)
RDW: 16.8 % — ABNORMAL HIGH (ref 11.5–15.5)
WBC: 8.9 10*3/uL (ref 4.0–10.5)
nRBC: 0 % (ref 0.0–0.2)

## 2020-03-06 LAB — IRON AND TIBC
Iron: 16 ug/dL — ABNORMAL LOW (ref 28–170)
Saturation Ratios: 6 % — ABNORMAL LOW (ref 10.4–31.8)
TIBC: 253 ug/dL (ref 250–450)
UIBC: 237 ug/dL

## 2020-03-06 LAB — FERRITIN: Ferritin: 193 ng/mL (ref 11–307)

## 2020-03-06 NOTE — Telephone Encounter (Signed)
Per Dr Celine Ahr, she wants pt to come see [facility] tomorrow for a wound vac change. When speaking to pt she stated that the machine has been off since last night. I asked pt why she has not contacted the nurse or our office this morning and she is unsure why. I advised pt to plug in the machine to the wall. Per patient she plug the charger to machine but it is not working.  I called Luellen Pucker [phone]) to make her aware of whats going on and she said she went yesterday to patients home (around 6) and machine was working. Per Luellen Pucker she stated she will contact daughter and will make a stop by patients home to check the machine this afternoon. I called patient to make her aware. I then called daughter Mickel Baas and made her aware of everything. I advised her of patients appt tomorrow with Zach at 1030 and to bring supplies with her. Advised laura to call the office or call the nurse if the machine does not work afterwards but that Luellen Pucker will stop by today. Both patient and patients daughter voiced understanding and highly advised to call the office if they have any questions or concerns.

## 2020-03-06 NOTE — Telephone Encounter (Signed)
Luellen Pucker called wanting to rely information to Dr Celine Ahr. States she seen patient Friday 03/02/20 for a wound vac change. States there is a purulent discharge coming for underneath the bottom of the incision site. Luellen Pucker states it is hard to describe, but it happens when she presses on pt abdomen. States there is a little odor to it and it is bile color. Luellen Pucker states she seen patient again for a wound vac change yesterday and again had some drainage. Luellen Pucker reports pt had a temp of 100 on 6/25 and 98.3 on 6/28. Per Luellen Pucker the wound itself looks like it is getting better. No redness around the site and patient was eating better. Luellen Pucker wanted me to let Dr Celine Ahr know of this. I advised Luellen Pucker pt has f/u with Thedore Mins 7/8, but that I would let Dr Celine Ahr know and will call patient if she needs her to come in for an earlier appt. No further concerns at this time.

## 2020-03-07 ENCOUNTER — Encounter: Payer: Self-pay | Admitting: Physician Assistant

## 2020-03-07 ENCOUNTER — Ambulatory Visit (INDEPENDENT_AMBULATORY_CARE_PROVIDER_SITE_OTHER): Payer: Self-pay | Admitting: Physician Assistant

## 2020-03-07 VITALS — BP 110/74 | HR 94 | Temp 97.7°F | Ht <= 58 in | Wt 179.0 lb

## 2020-03-07 DIAGNOSIS — Z09 Encounter for follow-up examination after completed treatment for conditions other than malignant neoplasm: Secondary | ICD-10-CM

## 2020-03-07 DIAGNOSIS — C187 Malignant neoplasm of sigmoid colon: Secondary | ICD-10-CM

## 2020-03-07 NOTE — Patient Instructions (Addendum)
May use Miralax powder in a glass of liquid twice daily. May adjust to once daily if stools get loose.   Take your temperature twice a day.   Increase your fluids.   Call us if any increased pain, nausea, or vomiting.   Follow up here in 2 weeks.

## 2020-03-07 NOTE — Progress Notes (Signed)
[facility] POST-OP OFFICE VISIT  03/07/2020  HPI: Debra [name] is a 54 y.o. female >1 months s/p open left hemicolectomy with mobilization of the splenic flexureforobstructing sigmoidadenocarcinomawith post-operative course complicated by post-operative ileus requiring TPN and inferior midline wound infection managed with wound vac.   She presents today for follow up and wound vac change. History obtained through the help of her granddaughter who translates for me. Her home health agency had called our office yesterday reporting that there was concern for "bilious drainage" from her wound vac when they changed it on Monday. Additionally, patient had reported intermittent fevers at home. These are reportedly as high as 102. However, family and patient believe these are associated with constipation and usually improve after bowel movement and without pharmacological intervention. Otherwise, she reports that she has no abdominal pain and pain with wound vac changes has improved drastically. She denied any nausea or emesis. She does feel more constipated and had not initiate any laxative for this. Her appetite has improved from her last visit on 06/24. No other new complaints.    Vital signs: BP 110/74    Pulse 94    Temp 97.7 F (36.5 C)    Ht 4\' 10"  (1.473 m)    Wt 179 lb (81.2 kg)    SpO2 100%    BMI 37.41 kg/m    Physical Exam: Constitutional: Well appearing female, somewhat weak, NAD Abdomen: Soft, I do not illicit any tenderness, no distension, no rebound or guarding Skin: The superior portion of her laparotomy incision is well healed, no erythema, no drainage. The inferior portion which is healing via secondary intention has beefy red granulation tissue throughout the wound bed, the surrounding skin has minimal and expected erythema, I am unable to illicit any drainage from the wound, certainly this is without purulence, fluctuance, or crepitus.     Assessment/Plan: This is a 54 y.o. female >1 months s/p open left hemicolectomy with mobilization of the splenic flexureforobstructing sigmoidadenocarcinomawith post-operative course complicated by post-operative ileus requiring TPN and inferior midline wound infection managed with wound vac.    - I changed wound vac at bedside; tolerating well; I suspect this will require wound vac for only 1-2 more weeks  - I am unsure what to make of her reported fevers at home, she is afebrile and without any concerns signs or symptoms on examination. Family believes these are related to her bowel medications -vs- constipation. I encouraged them to monitor temperature daily and record. If she has persistent fevers or develops any additional symptoms (worsening pain, nausea, emesis) then we may need to repeat CT Abdomen/Pelvis vs re-admit for work up. They are in agreement  - Continue pain control prn  - Continue home health wound vac changes MWF  - Follow up with oncology as scheduled; once midline wound healed we will discuss port placement for chemotherapy initiation   - Follow up with Dr Celine Ahr or myself in 2 weeks for wound check   -- Debra Simon, PA-C [facility] 03/07/2020, 11:14 AM [phone] M-F: 7am - 4pm

## 2020-03-08 ENCOUNTER — Encounter: Payer: Self-pay | Admitting: Oncology

## 2020-03-08 ENCOUNTER — Inpatient Hospital Stay: Payer: Self-pay | Attending: Oncology | Admitting: Oncology

## 2020-03-08 ENCOUNTER — Inpatient Hospital Stay: Payer: Self-pay

## 2020-03-08 ENCOUNTER — Other Ambulatory Visit: Payer: Self-pay

## 2020-03-08 VITALS — BP 104/61 | HR 81 | Resp 20

## 2020-03-08 VITALS — BP 103/70 | HR 96 | Temp 98.6°F | Resp 18 | Wt 180.2 lb

## 2020-03-08 DIAGNOSIS — D509 Iron deficiency anemia, unspecified: Secondary | ICD-10-CM | POA: Insufficient documentation

## 2020-03-08 DIAGNOSIS — Z7189 Other specified counseling: Secondary | ICD-10-CM

## 2020-03-08 DIAGNOSIS — D500 Iron deficiency anemia secondary to blood loss (chronic): Secondary | ICD-10-CM

## 2020-03-08 DIAGNOSIS — C187 Malignant neoplasm of sigmoid colon: Secondary | ICD-10-CM

## 2020-03-08 MED ORDER — SODIUM CHLORIDE 0.9 % IV SOLN: 200.0000 mg | Freq: Once | INTRAVENOUS | Status: DC

## 2020-03-08 MED ORDER — LIDOCAINE-PRILOCAINE 2.5-2.5 % EX CREA: TOPICAL_CREAM | CUTANEOUS | 3 refills | Status: DC

## 2020-03-08 MED ORDER — ONDANSETRON HCL 8 MG PO TABS: 8.0000 mg | ORAL_TABLET | Freq: Two times a day (BID) | ORAL | 1 refills | Status: DC | PRN

## 2020-03-08 MED ORDER — PROCHLORPERAZINE MALEATE 10 MG PO TABS: 10.0000 mg | ORAL_TABLET | Freq: Four times a day (QID) | ORAL | 1 refills | Status: DC | PRN

## 2020-03-08 MED ORDER — IRON SUCROSE 20 MG/ML IV SOLN
200.0000 mg | Freq: Once | INTRAVENOUS | Status: AC
  Administered 2020-03-08: 200 mg via INTRAVENOUS
  Filled 2020-03-08: qty 10

## 2020-03-08 MED ORDER — SODIUM CHLORIDE 0.9 % IV SOLN
Freq: Once | INTRAVENOUS | Status: AC
  Filled 2020-03-08: qty 250

## 2020-03-08 NOTE — Progress Notes (Signed)
Hematology/Oncology Progress Note [facility] Telephone:([phone] [phone]  Patient Care Team: [name], MD as PCP - General (Family Medicine) [name], RN as Registered Nurse [name], RN as Registered Nurse   Name of the patient: Debra [name]  [id number]  01-24-1966  Date of visit: 03/08/20  PERTINENT ONCOLOGY HISTORY Debra [name] is a 54 y.o.afemale who has above oncology history reviewed by me today presented for follow up visit for management of Stage IIIB Colon cancer seen by me on 11/21/2019 during her hospitalization. Patient has a history of Covid positivity. Severe iron deficiency status post IV Venofer treatment. Colonoscopy 11/21/2019 showed a malignant appearing severe stenosis found in the sigmoid colon at 25 cm proximal to the anus and was nontransversed.  Biopsy was taken.  Surgical pathology is pending  Nonbleeding external hemorrhoids Biopsy was not diagnostic 01/03/2020 virtual colonoscopy showed long segment area of wall thickening and annular constriction within the sigmoid colon in the area of previous concern most compatible with annular malignancy.  Remainder of the colon evaluation is somewhat limited due to large amount of retained stool and incomplete distention proximal to the annular constricting lesion. 01/30/2020 Patient underwent left hemicolectomy by Dr. Celine Ahr and a biopsy showed invasive adenocarcinoma, poorly differentiated.  37 lymph nodes negative for malignancy.  Grade 3, all margins are uninvolved.  Perineural invasion present.  Tumor deposit present. pT3 pN1c cM0 Checks x-ray did not show any lung mass. #Postop, patient developed postop ileus and she required TPN, PICC and NG tube placement.  Postoperation day 9 she continued to have increased leukocytosis and was subsequently found to have a midline wound infection to the inferior portion of the wound.  Patient was treated with  antibiotics.  INTERVAL HISTORY-  54 y.o. female presents for management of colon cancer. Patient has received IV Venofer treatments.  Her hemoglobin is better.  Fatigue slightly better. Patient also takes oral iron supplementation which caused her to have constipation.  She still has the drainage catheter and was recently seen by surgery.  There is plan to discontinue  Catheter in 1 to 2 weeks    Review of systems- Review of Systems  Constitutional: Positive for fatigue. Negative for unexpected weight change.  Respiratory: Negative for shortness of breath.   Cardiovascular: Negative for chest pain.  Gastrointestinal: Positive for constipation. Negative for abdominal distention and abdominal pain.       Wound vac  Genitourinary: Negative for dysuria.   Skin: Positive for wound. Negative for rash.  Neurological: Negative for headaches and light-headedness.  Hematological: Negative for adenopathy.  Psychiatric/Behavioral: Negative for confusion.    No Known Allergies  Patient Active Problem List   Diagnosis Date Noted   Loss of weight 02/15/2020   Goals of care, counseling/discussion 02/15/2020   Colon cancer ([place]) 02/15/2020   Pressure injury of skin 02/03/2020   Bowel obstruction ([place]) 01/29/2020   Iron deficiency anemia due to chronic blood loss 11/22/2019   COVID-19 virus detected 11/20/2019   Symptomatic anemia 11/20/2019   Hypothyroidism 11/20/2019   Iron deficiency anemia 11/20/2019   GIB (gastrointestinal bleeding) 11/20/2019     Past Medical History:  Diagnosis Date   Anemia    Hypothyroidism    Thyroid disease      Past Surgical History:  Procedure Laterality Date   COLONOSCOPY WITH PROPOFOL N/A 11/21/2019   Procedure: COLONOSCOPY WITH PROPOFOL;  Surgeon: Lin Landsman, MD;  Location: [facility] ENDOSCOPY;  Service: Gastroenterology;  Laterality:  N/A;   CYSTOSCOPY WITH STENT PLACEMENT Left 01/30/2020   Procedure: CYSTOSCOPY WITH STENT  PLACEMENT;  Surgeon: Hollice Espy, MD;  Location: [facility] ORS;  Service: Urology;  Laterality: Left;   ESOPHAGOGASTRODUODENOSCOPY (EGD) WITH PROPOFOL N/A 11/21/2019   Procedure: ESOPHAGOGASTRODUODENOSCOPY (EGD) WITH PROPOFOL;  Surgeon: Lin Landsman, MD;  Location: [facility] ENDOSCOPY;  Service: Gastroenterology;  Laterality: N/A;   PARTIAL COLECTOMY N/A 01/30/2020   Procedure: PARTIAL COLECTOMY Sigmoid;  Surgeon: [name], MD;  Location: [facility] ORS;  Service: General;  Laterality: N/A;    Social History   Socioeconomic History   Marital status: Married    Spouse name: Not on file   Number of children: Not on file   Years of education: Not on file   Highest education level: Not on file  Occupational History   Occupation: Factory  Tobacco Use   Smoking status: Never Smoker   Smokeless tobacco: Never Used  Scientific laboratory technician Use: Never used  Substance and Sexual Activity   Alcohol use: Never   Drug use: Never   Sexual activity: Not on file  Other Topics Concern   Not on file  Social History Narrative   Not on file   Social Determinants of Health   Financial Resource Strain:    Difficulty of Paying Living Expenses:   Food Insecurity:    Worried About Charity fundraiser in the Last Year:    Arboriculturist in the Last Year:   Transportation Needs:    Film/video editor (Medical):    Lack of Transportation (Non-Medical):   Physical Activity:    Days of Exercise per Week:    Minutes of Exercise per Session:   Stress:    Feeling of Stress :   Social Connections:    Frequency of Communication with Friends and Family:    Frequency of Social Gatherings with Friends and Family:    Attends Religious Services:    Active Member of Clubs or Organizations:    Attends Music therapist:    Marital Status:   Intimate Partner Violence:    Fear of Current or Ex-Partner:    Emotionally Abused:    Physically Abused:     Sexually Abused:      Family History  Problem Relation Age of Onset   Diabetes Sister      Current Outpatient Medications:    ferrous sulfate 325 (65 FE) MG tablet, Take 1 tablet (325 mg total) by mouth 2 (two) times daily with a meal., Disp: 60 tablet, Rfl: 3   ibuprofen (ADVIL) 800 MG tablet, Take 1 tablet (800 mg total) by mouth every 8 (eight) hours as needed., Disp: 30 tablet, Rfl: 0   levothyroxine (SYNTHROID) 137 MCG tablet, Take 137 mcg by mouth daily before breakfast. , Disp: , Rfl:    oxyCODONE (OXY IR/ROXICODONE) 5 MG immediate release tablet, Take 1 tablet (5 mg total) by mouth every 6 (six) hours as needed for severe pain., Disp: 15 tablet, Rfl: 0   polyethylene glycol (MIRALAX / GLYCOLAX) 17 g packet, Take 17 g by mouth daily., Disp: , Rfl:    senna-docusate (SENOKOT-S) 8.6-50 MG tablet, Take 1 tablet by mouth at bedtime., Disp: 30 tablet, Rfl: 1   Physical exam:  Vitals:   03/08/20 1054  BP: 103/70  Pulse: 96  Resp: 18  Temp: 98.6 F (37 C)  Weight: 180 lb 3.2 oz (81.7 kg)   Physical Exam Constitutional:      General:  She is not in acute distress.    Comments: Patient walks independently  HENT:     Head: Normocephalic and atraumatic.     Mouth/Throat:     Pharynx: No oropharyngeal exudate.  Eyes:     General: No scleral icterus.    Pupils: Pupils are equal, round, and reactive to light.  Cardiovascular:     Rate and Rhythm: Normal rate and regular rhythm.     Heart sounds: No murmur heard.   Pulmonary:     Effort: Pulmonary effort is normal. No respiratory distress.  Abdominal:     General: There is no distension.     Palpations: Abdomen is soft.     Tenderness: There is no abdominal tenderness.  Musculoskeletal:        General: Normal range of motion.     Cervical back: Normal range of motion and neck supple.  Skin:    General: Skin is warm and dry.     Findings: No erythema.  Neurological:     Mental Status: She is alert and oriented to  person, place, and time.  Psychiatric:        Mood and Affect: Affect normal.        CMP Latest Ref Rng & Units 02/17/2020  Glucose 70 - 99 mg/dL 137(H)  BUN 6 - 20 mg/dL <5(L)  Creatinine 0.44 - 1.00 mg/dL 0.54  Sodium 135 - 145 mmol/L 132(L)  Potassium 3.5 - 5.1 mmol/L 3.8  Chloride 98 - 111 mmol/L 99  CO2 22 - 32 mmol/L 25  Calcium 8.9 - 10.3 mg/dL 8.2(L)  Total Protein 6.5 - 8.1 g/dL 6.8  Total Bilirubin 0.3 - 1.2 mg/dL 0.5  Alkaline Phos 38 - 126 U/L 96  AST 15 - 41 U/L 51(H)  ALT 0 - 44 U/L 30   CBC Latest Ref Rng & Units 03/06/2020  WBC 4.0 - 10.5 K/uL 8.9  Hemoglobin 12.0 - 15.0 g/dL 9.4(L)  Hematocrit 36 - 46 % 29.4(L)  Platelets 150 - 400 K/uL 255    RADIOGRAPHIC STUDIES: I have personally reviewed the radiological images as listed and agreed with the findings in the report. US Guided Needle Placement  Result Date: 02/09/2020 INDICATION: Peritoneal fluid collections and status post resection of sigmoid colonic carcinoma. EXAM: ULTRASOUND-GUIDED ASPIRATION OF ABDOMINAL FLUID MEDICATIONS: The patient is currently admitted to the hospital and receiving intravenous antibiotics. The antibiotics were administered within an appropriate time frame prior to the initiation of the procedure. ANESTHESIA/SEDATION: Fentanyl 100 mcg IV; Versed 2.0 mg IV Moderate Sedation Time:  14 minutes. The patient was continuously monitored during the procedure by the interventional radiology nurse under my direct supervision. COMPLICATIONS: None immediate. PROCEDURE: Informed written consent was obtained from the patient after a thorough discussion of the procedural risks, benefits and alternatives. All questions were addressed. Maximal Sterile Barrier Technique was utilized including caps, mask, sterile gowns, sterile gloves, sterile drape, hand hygiene and skin antiseptic. A timeout was performed prior to the initiation of the procedure. Fluid in the right abdomen inferior to the tip of the liver was  localized. Local anesthesia was provided with 1% lidocaine. Under direct ultrasound guidance, initially a 6 [place] Safe-T-Centesis catheter was advanced. This was followed by a 5 [place] Yueh catheter. Aspiration of fluid was performed and the fluid sample sent for culture analysis. FINDINGS: Fluid is present inferior to the tip of the liver as seen by recent CT. There was not initial good return via the Safe-T-Centesis catheter due to buckling  of the catheter in the abdominal wall. 40 mL of clear, orange colored fluid was able to be removed via a Yueh centesis catheter. IMPRESSION: Ultrasound-guided aspiration of right-sided abdominal fluid inferior to the liver. 40 mL of clear, orangish fluid was removed and sent for culture analysis. Electronically Signed   By: Aletta Edouard M.D.   On: 02/09/2020 07:56   CT ABDOMEN PELVIS W CONTRAST  Result Date: 02/18/2020 CLINICAL DATA:  Diarrhea EXAM: CT ABDOMEN AND PELVIS WITH CONTRAST TECHNIQUE: Multidetector CT imaging of the abdomen and pelvis was performed using the standard protocol following bolus administration of intravenous contrast. CONTRAST:  12mL OMNIPAQUE IOHEXOL 300 MG/ML  SOLN COMPARISON:  None. FINDINGS: LOWER CHEST: Bibasilar atelectasis HEPATOBILIARY: Normal hepatic contours. No intra- or extrahepatic biliary dilatation. Normal gallbladder. PANCREAS: Normal pancreas. No ductal dilatation or peripancreatic fluid collection. SPLEEN: Normal. ADRENALS/URINARY TRACT: The adrenal glands are normal. No hydronephrosis, nephroureterolithiasis or solid renal mass. The urinary bladder is normal for degree of distention STOMACH/BOWEL: Postsurgical changes with mid abdominal colonic anastomosis. There is fluid in the right paracolic gutter and in the anterior midline abdomen, anterior to the anastomosis. The left colon has been resected. VASCULAR/LYMPHATIC: Normal course and caliber of the major abdominal vessels. No abdominal or pelvic lymphadenopathy.  REPRODUCTIVE: Slightly increased size of fluid collection within the posterior cul-de-sac. MUSCULOSKELETAL. No bony spinal canal stenosis or focal osseous abnormality. OTHER: None. IMPRESSION: 1. Postsurgical changes with mid abdominal colonic anastomosis. There is fluid in the right paracolic gutter and at multiple locations throughout the abdomen, little changed from 02/07/20. 2. Slightly increased size of fluid collection within the posterior cul-de-sac, which is partially encapsulated. This could indicate an organizing seroma or developing abscess. 3. No free intraperitoneal air.  No evidence of obstruction. Electronically Signed   By: Ulyses Jarred M.D.   On: 02/18/2020 05:28   DG Chest Portable 1 View  Result Date: 02/18/2020 CLINICAL DATA:  Fever EXAM: PORTABLE CHEST 1 VIEW COMPARISON:  None. FINDINGS: The heart size and mediastinal contours are within normal limits. Both lungs are clear. The visualized skeletal structures are unremarkable. Hyperdense foci project over the distal esophagus and left upper quadrant abdomen. IMPRESSION: 1. No active disease. 2. Hyperdense foci projecting over the distal esophagus and left upper quadrant abdomen may indicate ingested medication. Electronically Signed   By: Ulyses Jarred M.D.   On: 02/18/2020 05:09    Assessment and plan-  1. Malignant neoplasm of sigmoid colon ([place])   2. Iron deficiency anemia due to chronic blood loss   3. Goals of care, counseling/discussion   Cancer Staging Colon cancer [facility]) Staging form: Colon and Rectum, AJCC 8th Edition - Clinical: No stage assigned - Unsigned - Pathologic: Stage IIIB (pT3, pN1c, cM0) - Signed by Earlie Server, MD on 02/15/2020  #Stage IIIb colon cancer Pathology was independently reviewed by me and discussed with the patient. The diagnosis and care plan were discussed with patient in detail.  NCCN guidelines were reviewed and shared with patient.   The goal of treatment is curative intent.  Clinically she has  improved. Further discussed about adjuvant chemotherapy with FOLFOX for 6 months. Chemotherapy to be started after wound is fully healed. Chemotherapy education was provided. I explained to the patient the risks and benefits of chemotherapy including all but not limited to infusion reaction, hair loss, hearing loss, mouth sore, nausea, vomiting, low blood counts, neuropathy, bleeding, heart failure, kidney failure and risk of life threatening infection and even death, secondary malignancy etc.  Patient voices understanding and willing to proceed chemotherapy.   # Chemotherapy education; port placement by Dr. Celine Ahr.   #Weight loss likely secondary to decreased oral intake.  She has establish care with nutritionist today.  She has gained more weight since last visit. #Severe iron deficiency anemia, Proceed with Venofer 200 mg today.  I will schedule patient to repeat another dose of Venofer in 2 weeks when she comes back for reevaluation  Supportive care measures are necessary for patient well-being and will be provided as necessary. We spent sufficient time to discuss many aspect of care, questions were answered to patient's satisfaction. Thank you for allowing me to participate in the care of this patient.  Follow-up in 2 weeks Earlie Server, MD, PhD Hematology Oncology [facility] at [facility] Pager- [phone] 03/08/2020

## 2020-03-08 NOTE — Progress Notes (Signed)
START ON PATHWAY REGIMEN - Colorectal     A cycle is every 14 days:     Oxaliplatin      Leucovorin      Fluorouracil      Fluorouracil   **Always confirm dose/schedule in your pharmacy ordering system**  Patient Characteristics: Postoperative without Neoadjuvant Therapy (Pathologic Staging), Colon, Stage III, Low Risk (pT1-3, pN1) Tumor Location: Colon Therapeutic Status: Postoperative without Neoadjuvant Therapy (Pathologic Staging) AJCC M Category: cM0 AJCC T Category: pT3 AJCC N Category: pN1c AJCC 8 Stage Grouping: IIIB Intent of Therapy: Curative Intent, Discussed with Patient

## 2020-03-08 NOTE — Progress Notes (Signed)
Patient here for follow up. Pt reports she is having severe constipation that is causing her pain to her rectum.No bowel movement in 5 days. She has started taking Miralax along with senokot.

## 2020-03-08 NOTE — Progress Notes (Signed)
Nutrition Assessment   Reason for Assessment:  Referral weight loss, poor appetite   ASSESSMENT:  54 year old female with near obstructing colon mass.  S/p sigmoid colectomy with primary anastomosis with post op complication of ileus, on TPN, wound infection now has wound vac in place.   Planning adjuvant chemotherapy with folfox 6 to 8 weeks after surgery.  Patient followed by Dr. Tasia Catchings.    Met with patient and daughter and interpreter.  Patient arrived 23 minutes late to appointment, then delayed in bathroom before RD able to see patient.  Patient reports constipation and rectal pain.  Seen by surgeon yesterday.  Has started miralax BID.  Passed small hard stools.  Patient asking if she needs to keep taking iron pills.  Reports that she does not have an appetite because she is constipated.  Has been eating eggs, rice, lentils, juices, tortillas, cheese, potatoes, chicken.  Drank ensure yesterday.     Medications: Fe sulfate, senna   Labs: reviewed from 6/11   Anthropometrics:   Height: 4'10" Weight: 180 lb 2 oz today 4/23 189 lb BMI: 37  5% weight loss in the last 2 months, concerning  Estimated Energy Needs  Kcals: 1700-2000 Protein: 85-100 g Fluid: 1.7 L   NUTRITION DIAGNOSIS: Inadequate oral intake related to constipation, GI surgery, abdominal pain as evidenced by 5% weight loss in 2 months, poor po intake    INTERVENTION:  Discussed importance of bowel regimen and to notify MD if regimen not working. Discussed importance of fluids with constipation. Discussed foods high in calories and protein for wound healing.   Provided samples of ensure enlive, boost plus for patient to try.  Encouraged 2-3 times per day if able to drink.  Coupons given as well.  Patient provided contact information   MONITORING, EVALUATION, GOAL: weight trends, intake   Next Visit: 4-6 weeks, will send scheduling message  Lanesha Azzaro B. Zenia Resides, [place], [place] Registered Dietitian [phone]  (pager)

## 2020-03-14 ENCOUNTER — Other Ambulatory Visit: Payer: Self-pay

## 2020-03-14 ENCOUNTER — Encounter: Payer: Self-pay | Admitting: Physician Assistant

## 2020-03-14 ENCOUNTER — Ambulatory Visit (INDEPENDENT_AMBULATORY_CARE_PROVIDER_SITE_OTHER): Payer: Self-pay | Admitting: Physician Assistant

## 2020-03-14 VITALS — BP 92/65 | HR 95 | Temp 97.3°F

## 2020-03-14 DIAGNOSIS — Z09 Encounter for follow-up examination after completed treatment for conditions other than malignant neoplasm: Secondary | ICD-10-CM

## 2020-03-14 DIAGNOSIS — C187 Malignant neoplasm of sigmoid colon: Secondary | ICD-10-CM

## 2020-03-14 NOTE — H&P (View-Only) (Signed)
[facility] POST-OP OFFICE VISIT  03/14/2020  HPI: Debra [name] is a 53 y.o. 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  She is reporting some drainage from the superior portion of her wound, this is serous No documented fevers but she endorses chills No nausea, emesis, diarrhea, or trouble with PO Continues with home wound vac changes Has been seen by oncology and will also need port placement. I saw her today with Dr Hampton Abbot for discussion of this.   Vital signs: BP 92/65   Pulse 95   Temp (!) 97.3 F (36.3 C) (Temporal)   SpO2 97%    Physical Exam: Constitutional: Well appearing female, somewhat weak, NAD Abdomen: Soft, I do not illicit any tenderness, no distension, no rebound or guarding Skin: The superior portion of her laparotomy incision is well healed, no erythema, no drainage. The inferior portion which is healing via secondary intention has beefy red granulation tissue throughout the wound bed, the surrounding skin has minimal and expected erythema, today we were able to express more serous appearing drainage from the more cephalad portion of the wound vac area, there was no pus, succus, or blood.    Assessment/Plan: This is a 54 y.o. 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 Will plan for next Tuesday 07/13 with Dr Hampton Abbot.   - All risks, benefits, and alternatives to above procedure(s) were discussed with the patient and her family, all of their questions were answered to their expressed satisfaction, patient expresses she wishes to  proceed, and informed consent was obtained.  - Continue wound vac changes as scheduled  - No need for ABx at this time, no signs of infection  - Pain control prn  - rtc after port-a-catheter placement    -- [name], PA-C [facility] 03/14/2020, 12:46 PM [phone] M-F: 7am - 4pm

## 2020-03-14 NOTE — Patient Instructions (Addendum)
Cuidado de las heridas, en adultos Wound Care, Adult El cuidado correcto de las heridas puede ayudar a Information systems manager y a [employer] infecciones y formacin de cicatrices. Adems, puede ayudar a que la cicatrizacin sea ms rpida. Cmo cuidar la herida Cuidados de la herida      Siga las instrucciones del mdico acerca del cuidado de la herida. Asegrese de hacer lo siguiente: ? Lvese las manos con agua y jabn antes de cambiar la venda (vendaje). Use desinfectante para manos si no dispone de [place] y [place]. ? Cambie el vendaje como se lo haya indicado el mdico. ? No retire los puntos (suturas), la goma para cerrar la piel o las tiras [place]. Es posible que estos cierres cutneos deban quedar puestos en la piel durante 2semanas o ms tiempo. Si los bordes de las tiras adhesivas empiezan a despegarse y Therapist, sports, puede recortar los que estn sueltos. No retire las tiras [facility] por completo a menos que el mdico se lo indique.  Controle la zona de la herida todos los das para detectar signos de infeccin. Est atento a los siguientes signos: ? Enrojecimiento, hinchazn o dolor. ? Lquido o sangre. ? Calor. ? Pus o mal olor.  Pregntele al mdico si debe limpiar la herida con agua y [place]. Hacer esto puede incluir lo siguiente: ? Usar una toalla limpia para secar la herida dando palmaditas despus de limpiarla. No se frote ni restriegue la herida. ? Aplicar una crema o un ungento. Hgalo nicamente como se lo haya indicado el mdico. ? Cubrir la incisin con un vendaje limpio.  Pregntele al mdico cundo puede dejar la herida al descubierto.  Mantenga el vendaje seco hasta que el mdico le diga que se lo puede quitar. No tome baos de inmersin, nade, use el jacuzzi ni haga ninguna actividad en la que sumerja la herida en agua hasta que el mdico lo autorice. Pregntele al mdico si puede ducharse. Thurston Pounds solo le permitan darse baos de [place]. Medicamentos   Si le  recetaron un antibitico, una crema o un ungento con antibitico, tmelo o aplqueselo como se lo haya indicado el mdico. No deje de tomar o de usar el antibitico, aunque la afeccin mejore.  Tome los medicamentos de venta libre y los recetados solamente como se lo haya indicado el mdico. Si le recetaron un analgsico, tmelo al menos 40minutos antes de Optometrist el cuidado de la herida, Civil Service fast streamer se lo haya indicado el mdico. Instrucciones generales  Reanude sus actividades normales segn lo indicado por el mdico. Pregntele al mdico qu actividades son seguras.  No se rasque ni se toque la herida.  No consuma ningn producto que contenga nicotina o tabaco, como cigarrillos y Psychologist, sport and exercise. El consumo de esos productos puede demorar la cicatrizacin de la herida. Si necesita ayuda para dejar de fumar, consulte al mdico.  Concurra a todas las visitas de seguimiento como se lo haya indicado el mdico. Esto es importante.  Siga una dieta que incluya protenas, vitaminas A y C y otros alimentos ricos en nutrientes que ayudarn a que la herida cicatrice. ? Los alimentos ricos en protenas incluyen carne, productos lcteos, frijoles y nueces, entre otras fuentes de protena. ? Los alimentos ricos en vitaminaA incluyen zanahorias y verduras de hoja verde oscuro. ? Los alimentos ricos en vitaminaC incluyen ctricos, tomates y [place] frutas y verduras. ? Los alimentos ricos en nutrientes tienen protenas, carbohidratos, grasas, vitaminas o minerales. Consuma una variedad de alimentos saludables, que incluya frutas, verduras  y cereales integrales. Comunquese con un mdico si:  Le aplicaron la antitetnica y tiene hinchazn, dolor intenso, enrojecimiento o hemorragia en el sitio de la inyeccin.  El dolor no se alivia con los [employer].  Tiene enrojecimiento, hinchazn o dolor alrededor de la herida.  Observa lquido o sangre que sale de la herida.  La herida se siente caliente  al tacto.  Tiene pus o percibe mal olor que emana de la herida.  Tiene fiebre o siente escalofros.  Siente nuseas o vomita.  Tiene mareos. Solicite ayuda de inmediato si:  Tiene una lnea roja que sale de la herida.  Los bordes de la herida se abren y se separan.  La herida sangra y el sangrado no se detiene con una presin [place].  Tiene una erupcin cutnea.  Se desmaya.  Tiene dificultad para respirar. Resumen  Siempre lvese las manos con agua y jabn antes de cambiar la venda (vendaje).  Para ayudar con la cicatrizacin, coma alimentos ricos en protenas, vitaminas A y C y dems nutrientes.  Controle la herida [employer] para detectar signos de infeccin. Comunquese con el mdico si sospecha que la herida est infectada. Esta informacin no tiene Marine scientist el consejo del mdico. Asegrese de hacerle al mdico cualquier pregunta que tenga. Document Revised: 12/07/2017 Document Reviewed: 03/11/2016 Elsevier Patient Education  [place] dispositivo de perfusin implantable Implanted Port Insertion La insercin de un dispositivo de perfusin implantable es un procedimiento realizado para Glass blower/designer un dispositivo y Holiday representative. El dispositivo tiene un disco inyectable al cual puede acceder su mdico. El dispositivo se conecta con una vena del pecho o el cuello mediante una pequea sonda flexible (catter). Hay diferentes tipos de dispositivos. Los dispositivos de perfusin implantables se usan para poder proporcionar lo siguiente mediante un Diplomatic Services operational officer (i.v.) a largo plazo:  Medicamentos como la quimioterapia.  Lquidos.  Nutricin lquida, como nutricin parenteral total (NPT). Al tener usted un dispositivo, su mdico no tendr que recurrir a las venas de los brazos para estos procedimientos. Informe al mdico acerca de lo siguiente:  Cualquier alergia que tenga.  Todos los medicamentos que [place], especialmente  anticoagulantes y vitaminas, hierbas, gotas oftlmicas, cremas, medicamentos de [place] y corticoesteroides.  Cualquier problema previo que usted o algn miembro de su familia haya tenido con los anestsicos.  Cualquier trastorno de la sangre que tenga.  Cirugas a las que se haya sometido.  Cualquier enfermedad que tenga o haya tenido; por ejemplo, diabetes o problemas renales.  Si est embarazada o podra estarlo. Cules son los riesgos? En general, se trata de un procedimiento seguro. Sin embargo, pueden ocurrir Arboriculturist, por ejemplo:  Chief of Staff a los medicamentos o a los tintes (sustancias de [place]).  Daos a otras estructuras u rganos.  Infeccin.  Daos en los vasos sanguneos, moretones o Furniture conservator/restorer de la puncin.  Cogulo de [place].  Laceracin de la piel alrededor del dispositivo.  Una acumulacin de aire en el pecho que puede provocar que uno de sus pulmones colapse (neumotrax). Esto es poco frecuente. Qu ocurre antes del procedimiento? Medicamentos  Consulte al mdico sobre: ? Quarry manager o suspender los medicamentos que toma habitualmente. Esto es muy importante si toma medicamentos para la diabetes o anticoagulantes. ? Tomar medicamentos como aspirina e ibuprofeno. Estos medicamentos pueden tener un efecto anticoagulante en la [place]. No tome estos medicamentos a menos que el mdico se lo indique. ? Tomar medicamentos de [employer], vitaminas, hierbas y  suplementos. Hidratacin Siga las indicaciones del mdico acerca de mantenerse hidratado, las cuales pueden incluir lo siguiente:  Hasta 2horas antes del procedimiento, puede beber lquidos transparentes, como agua, jugos de fruta sin pulpa, caf negro y t solo.  Restricciones en las comidas y bebidas  Siga las indicaciones del mdico respecto de las comidas y bebidas, las cuales pueden incluir lo siguiente: ? 8 horas antes del procedimiento, no coma alimentos pesados,  por ejemplo, carnes, alimentos con alto contenido graso o fritos. ? 6 horas antes del procedimiento, deje de ingerir comidas o alimentos livianos, como tostadas o cereales. ? 6 horas antes del procedimiento, deje de tomar leche o bebidas que [employer]. ? 2 horas antes del procedimiento, deje de beber lquidos transparentes. Indicaciones generales  Haga que alguien lo lleve a su casa desde el hospital o la clnica.  Si se ir a su casa inmediatamente despus del procedimiento, planifique que alguien se quede con usted durante 24horas.  Posiblemente deba realizarse anlisis de [place].  No consuma ningn producto que contenga nicotina o tabaco como mnimo durante 4 a 6semanas antes del procedimiento. Estos productos incluyen cigarrillos, cigarrillos electrnicos y tabaco para Higher education careers adviser. Si necesita ayuda para dejar de fumar, consulte al [employer].  Pregntele al mdico qu medidas se tomarn para ayudar a prevenir una infeccin. Estas medidas pueden incluir: ? Rasurar el vello del lugar de la ciruga. ? Lavar la piel con un jabn antisptico. ? Tomar antibiticos. Qu ocurre durante el procedimiento?   Le colocarn una va intravenosa en una vena.  Le administrarn uno o ms de los siguientes medicamentos: ? Un medicamento para ayudarlo a relajarse (sedante). ? Un medicamento para adormecer la zona (anestesia local).  Le realizarn dos incisiones pequeas para insertar el dispositivo. ? Le harn una incisin ms pequea en el cuello para tener acceso a la vena en la cual se Hydrologist. ? Le harn la otra incisin en la parte superior del trax. All permanecer el dispositivo.  Se tomarn radiografas constantemente (fluoroscopia) u obtendrn otras imgenes mediante otras herramientas diagnstico por imgenes como gua durante el procedimiento.  Se colocarn el dispositivo y catter. Puede que haya un rea pequea y elevada en donde se encuentra el dispositivo.  El  dispositivo se purgar con solucin salina y se extraer sangre para garantizar que est funcione correctamente.  Se cerrarn las incisiones.  Puede que le coloquen vendas (vendaje) sobre las incisiones. El procedimiento puede variar segn el mdico y el hospital. Sander Nephew ocurre despus del procedimiento?  Le controlarn la presin arterial, la frecuencia cardaca, la frecuencia respiratoria y Retail buyer de oxgeno en la sangre hasta que le den el alta del hospital o la clnica.  No conduzca durante 24horas si le administraron un sedante durante el procedimiento.  Le darn una tarjeta con informacin del fabricante en la que se indicar el tipo de dispositivo que tiene. Llvela con usted.  Se debe purgar y revisar el dispositivo como se lo haya indicado su mdico, generalmente esto se hace despus de algunas semanas.  Se realizar una radiografa de trax para: ? Controlar la ubicacin del dispositivo. ? Controlar el pulmn y asegurarse de que no se haya daado. Resumen  La insercin de un dispositivo de perfusin implantable es un procedimiento realizado para Glass blower/designer un dispositivo y Holiday representative.  Los dispositivos de perfusin implantables se usan para poder proporcionar un acceso intravenoso (i.v.) a Barrister's clerk.  Se debe purgar y Landscape architect dispositivo como se lo haya indicado su mdico,  generalmente esto se hace despus de algunas semanas.  Tenga la tarjeta de informacin del fabricante con usted en todo momento. Esta informacin no tiene Marine scientist el consejo del mdico. Asegrese de hacerle al mdico cualquier pregunta que tenga. Document Revised: 04/28/2018 Document Reviewed: 04/28/2018 Elsevier Patient Education  2020 [employer].

## 2020-03-14 NOTE — Progress Notes (Signed)
[facility] POST-OP OFFICE VISIT  03/14/2020  HPI: Debra [name] is a 54 y.o. 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  She is reporting some drainage from the superior portion of her wound, this is serous No documented fevers but she endorses chills No nausea, emesis, diarrhea, or trouble with PO Continues with home wound vac changes Has been seen by oncology and will also need port placement. I saw her today with Dr Hampton Abbot for discussion of this.   Vital signs: BP 92/65   Pulse 95   Temp (!) 97.3 F (36.3 C) (Temporal)   SpO2 97%    Physical Exam: Constitutional: Well appearing female, somewhat weak, NAD Abdomen: Soft, I do not illicit any tenderness, no distension, no rebound or guarding Skin: The superior portion of her laparotomy incision is well healed, no erythema, no drainage. The inferior portion which is healing via secondary intention has beefy red granulation tissue throughout the wound bed, the surrounding skin has minimal and expected erythema, today we were able to express more serous appearing drainage from the more cephalad portion of the wound vac area, there was no pus, succus, or blood.    Assessment/Plan: This is a 54 y.o. 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 Will plan for next Tuesday 07/13 with Dr Hampton Abbot.   - All risks, benefits, and alternatives to above procedure(s) were discussed with the patient and her family, all of their questions were answered to their expressed satisfaction, patient expresses she wishes to  proceed, and informed consent was obtained.  - Continue wound vac changes as scheduled  - No need for ABx at this time, no signs of infection  - Pain control prn  - rtc after port-a-catheter placement    -- [name], PA-C [facility] 03/14/2020, 12:46 PM [phone] M-F: 7am - 4pm

## 2020-03-14 NOTE — Addendum Note (Signed)
Addended by: Riki Sheer on: 03/14/2020 03:53 PM   Modules accepted: Orders, SmartSet

## 2020-03-15 ENCOUNTER — Telehealth: Payer: Self-pay | Admitting: Surgery

## 2020-03-15 ENCOUNTER — Encounter: Payer: Self-pay | Admitting: Physician Assistant

## 2020-03-15 ENCOUNTER — Ambulatory Visit: Payer: Self-pay | Admitting: Oncology

## 2020-03-15 NOTE — Telephone Encounter (Signed)
Outgoing call is made, left message for patient to call.  Please advise patient of Pre-Admission date/time, COVID Testing date and Surgery date.  Surgery Date: 03/20/20 Preadmission Testing Date: 03/16/20 (phone 8a-1p) Covid Testing Date: 03/19/20 - patient advised to go to the [facility] between 8a-1p   Also patient needs to call [phone], between 1-3:00pm the day before surgery, to find out what time to arrive for surgery.

## 2020-03-16 ENCOUNTER — Other Ambulatory Visit: Payer: Self-pay

## 2020-03-16 ENCOUNTER — Encounter
Admission: RE | Admit: 2020-03-16 | Discharge: 2020-03-16 | Disposition: A | Discharge status: Home / Self Care | Payer: Self-pay | Source: Ambulatory Visit | Attending: Surgery | Admitting: Surgery

## 2020-03-16 NOTE — Patient Instructions (Signed)
Your procedure is scheduled on: Tuesday March 20, 2020.  Su procedimiento est programado para: Martes Nebo 2021.  Report to Day Surgery inside [place] 2nd floor. Presntese a: Veterinary surgeon, [place] 2do piso. To find out your arrival time please call [phone] between 1PM - 3PM on Monday March 19, 2020. Para saber su hora de llegada por favor llame al [phone] Debra [name] la 1PM - 3PM el da: Debra [name] 12 de Debra [name] 2021.  Remember: Instructions that are not followed completely may result in serious medical risk, up to and including death,  or upon the discretion of your surgeon and anesthesiologist your surgery may need to be rescheduled.  Recuerde: Las instrucciones que no se siguen completamente Heritage manager en un riesgo de salud grave, incluyendo hasta  la [place] o a discrecin de su cirujano y Environmental health practitioner, su ciruga se puede posponer.   __X_ 1.Do not eat food after midnight the night before your procedure. No    gum chewing or hard candies. You may drink clear liquids up to 2 hours     before you are scheduled to arrive for your surgery- DO not drink clear     Liquids within 2 hours of the start of your surgery.     Clear Liquids include:    water, apple juice without pulp, clear carbohydrate drink such as    Clearfast of [place], Black Coffee or Tea (Do not add anything to coffee or tea).      No coma nada despus de la medianoche de la noche anterior a su    procedimiento. No coma chicles ni caramelos duros. Puede tomar    lquidos claros hasta 2 horas antes de su hora programada de llegada al     hospital para su procedimiento. No tome lquidos claros durante el     transcurso de las 2 horas de su llegada programada al hospital para su     procedimiento, ya que esto puede llevar a que su procedimiento se    retrase o tenga que volver a Health and safety inspector.  Los lquidos claros incluyen:          - Agua o jugo de [place] sin pulpa           - Bebidas claras con carbohidratos como ClearFast o Gatorade          - Caf negro o t claro (sin leche, sin cremas, no agregue nada al caf ni al t)  No tome nada que no est en esta lista.  Los pacientes con diabetes tipo 1 y tipo 2 solo deben Agricultural engineer.  Llame a la clnica de PreCare o a la unidad de Same Day Surgery si  tiene alguna pregunta sobre estas instrucciones.              _X__ 2.Do Not Smoke or use e-cigarettes For 24 Hours Prior to Your Surgery.    Do not use any chewable tobacco products for at least 6   hours prior to surgery.    No fume ni use cigarrillos electrnicos durante las 24 horas previas    a su [place].  No use ningn producto de tabaco masticable durante   al menos 6 horas antes de la [place].     __X_ 3. No alcohol for 24 hours before or after surgery.    No tome alcohol durante las 24 horas antes ni despus de la [place].   __X__4. Brush your teeth with toothpaste as normally,  you may use mouthwash. Do not swallow toothpaste or mouthwash.   Cepille sus dientes con pasta de dientes como de costumbre, puede usar enjuague bucal. No ingiera pasta de dientes   ni enjuague bucal.    _X___ 5. Notify your doctor if there is any change in your medical condition (cold,fever, infections).    Informe a su mdico si hay algn cambio en su condicin mdica  (resfriado, fiebre, infecciones).   Do not wear jewelry, make-up, hairpins, clips or nail polish.  No use joyas, maquillajes, pinzas/ganchos para el cabello ni esmalte de uas.  Do not wear lotions, powders, or perfumes. You may wear deodorant.  No use lociones, polvos o perfumes.  Puede usar desodorante.    Do not shave 48 hours prior to surgery. Men may shave face and neck.  No se afeite 48 horas antes de la [place].  Los hombres pueden [employer] cara  y el cuello.   Do not bring valuables to the hospital.   No lleve objetos [place] is not responsible for any belongings or  valuables.  [place] no se hace responsable de ningn tipo de pertenencias u objetos de Geographical information systems officer.               Contacts, dentures or bridgework may not be worn into surgery.  Los lentes de [place], las dentaduras postizas o puentes no se pueden usar en la [place].   Leave your suitcase in the car. After surgery it may be brought to your room.  Deje su maleta en el auto.  Despus de la ciruga podr traerla a su habitacin.   For patients admitted to the hospital, discharge time is determined by your  treatment team.  Para los pacientes que sean ingresados al hospital, el tiempo en el cual se le  dar de alta es determinado por su equipo de [place].   Patients discharged the day of surgery will not be allowed to drive home. A los pacientes que se les da de alta el mismo da de la ciruga no se les permitir conducir a Holiday representative.   __X__ Take these medicines the morning of surgery with A SIP OF WATER:          [employer] estas medicinas la maana de la ciruga con UN SORBO DE AGUA:  1. levothyroxine (SYNTHROID) 137     __X__ Use CHG Soap as directed          Utilice el jabn de CHG segn lo indicado   __X__ Stop Anti-inflammatories such as ibuprofen (ADVIL), Aleve, naproxen, aspirin, and or BC powders.          Deje de tomar antiinflamatorios el da: ibuprofen (ADVIL), Aleve, naproxen, aspirin o polvos de BC powder.   __X__ Stop supplements until after surgery            Deje de tomar suplementos hasta despus de la ciruga  __X__ Do not start any herbal supplements before your surgery.   No empieze a tomar suplementos de hierbas antes de su cirugia.

## 2020-03-19 ENCOUNTER — Other Ambulatory Visit: Payer: Self-pay

## 2020-03-19 ENCOUNTER — Inpatient Hospital Stay: Payer: Self-pay

## 2020-03-19 ENCOUNTER — Other Ambulatory Visit
Admission: RE | Admit: 2020-03-19 | Discharge: 2020-03-19 | Disposition: A | Discharge status: Home / Self Care | Payer: HRSA Program | Source: Ambulatory Visit | Attending: Surgery | Admitting: Surgery

## 2020-03-19 DIAGNOSIS — Z01812 Encounter for preprocedural laboratory examination: Secondary | ICD-10-CM | POA: Diagnosis present

## 2020-03-19 DIAGNOSIS — Z20822 Contact with and (suspected) exposure to covid-19: Secondary | ICD-10-CM | POA: Diagnosis not present

## 2020-03-19 LAB — SARS CORONAVIRUS 2 (TAT 6-24 HRS): SARS Coronavirus 2: NEGATIVE

## 2020-03-19 NOTE — Telephone Encounter (Signed)
Debra [name] with [facility] called to remind patient to get her Covid testing done today 03/19/20 before 1:00 and also to call [phone] between 1:00 - 3:00 to find out what time to arrive day of surgery.  Patient voices understanding with this.

## 2020-03-20 ENCOUNTER — Other Ambulatory Visit: Payer: Self-pay | Admitting: Surgery

## 2020-03-20 ENCOUNTER — Ambulatory Visit: Payer: Self-pay | Admitting: Certified Registered"

## 2020-03-20 ENCOUNTER — Encounter: Payer: Self-pay | Admitting: Surgery

## 2020-03-20 ENCOUNTER — Telehealth: Payer: Self-pay

## 2020-03-20 ENCOUNTER — Other Ambulatory Visit: Payer: Self-pay

## 2020-03-20 ENCOUNTER — Encounter
Admission: RE | Disposition: A | Discharge status: Home / Self Care | Payer: Self-pay | Source: Home / Self Care | Attending: Surgery

## 2020-03-20 ENCOUNTER — Ambulatory Visit
Admission: RE | Admit: 2020-03-20 | Discharge: 2020-03-20 | Disposition: A | Discharge status: Home / Self Care | Payer: Self-pay | Attending: Surgery | Admitting: Surgery

## 2020-03-20 DIAGNOSIS — E039 Hypothyroidism, unspecified: Secondary | ICD-10-CM | POA: Insufficient documentation

## 2020-03-20 DIAGNOSIS — Z5309 Procedure and treatment not carried out because of other contraindication: Secondary | ICD-10-CM | POA: Insufficient documentation

## 2020-03-20 DIAGNOSIS — Z9049 Acquired absence of other specified parts of digestive tract: Secondary | ICD-10-CM | POA: Insufficient documentation

## 2020-03-20 DIAGNOSIS — C187 Malignant neoplasm of sigmoid colon: Secondary | ICD-10-CM

## 2020-03-20 DIAGNOSIS — T8141XA Infection following a procedure, superficial incisional surgical site, initial encounter: Secondary | ICD-10-CM | POA: Insufficient documentation

## 2020-03-20 DIAGNOSIS — D649 Anemia, unspecified: Secondary | ICD-10-CM | POA: Insufficient documentation

## 2020-03-20 DIAGNOSIS — Z452 Encounter for adjustment and management of vascular access device: Secondary | ICD-10-CM | POA: Insufficient documentation

## 2020-03-20 DIAGNOSIS — X58XXXA Exposure to other specified factors, initial encounter: Secondary | ICD-10-CM | POA: Insufficient documentation

## 2020-03-20 SURGERY — INSERTION, TUNNELED CENTRAL VENOUS DEVICE, WITH PORT
Anesthesia: General | Laterality: Left

## 2020-03-20 MED ORDER — MIDAZOLAM HCL 2 MG/2ML IJ SOLN
INTRAMUSCULAR | Status: AC
  Filled 2020-03-20: qty 2

## 2020-03-20 MED ORDER — AMOXICILLIN-POT CLAVULANATE 875-125 MG PO TABS
1.0000 | ORAL_TABLET | Freq: Two times a day (BID) | ORAL | 0 refills | Status: AC
Start: 2020-03-20 — End: 2020-03-27

## 2020-03-20 MED ORDER — ORAL CARE MOUTH RINSE: 15.0000 mL | Freq: Once | OROMUCOSAL | Status: AC

## 2020-03-20 MED ORDER — LACTATED RINGERS IV SOLN: INTRAVENOUS | Status: DC

## 2020-03-20 MED ORDER — ACETAMINOPHEN 500 MG PO TABS
ORAL_TABLET | ORAL | Status: AC
  Administered 2020-03-20: 1000 mg via ORAL
  Filled 2020-03-20: qty 2

## 2020-03-20 MED ORDER — FAMOTIDINE 20 MG PO TABS: 20.0000 mg | ORAL_TABLET | Freq: Once | ORAL | Status: AC

## 2020-03-20 MED ORDER — LIDOCAINE HCL (PF) 2 % IJ SOLN
INTRAMUSCULAR | Status: AC
  Filled 2020-03-20: qty 5

## 2020-03-20 MED ORDER — GABAPENTIN 300 MG PO CAPS
ORAL_CAPSULE | ORAL | Status: AC
  Administered 2020-03-20: 300 mg via ORAL
  Filled 2020-03-20: qty 1

## 2020-03-20 MED ORDER — GABAPENTIN 300 MG PO CAPS: 300.0000 mg | ORAL_CAPSULE | ORAL | Status: AC

## 2020-03-20 MED ORDER — PROPOFOL 500 MG/50ML IV EMUL
INTRAVENOUS | Status: AC
  Filled 2020-03-20: qty 50

## 2020-03-20 MED ORDER — FENTANYL CITRATE (PF) 100 MCG/2ML IJ SOLN
INTRAMUSCULAR | Status: AC
  Filled 2020-03-20: qty 2

## 2020-03-20 MED ORDER — CHLORHEXIDINE GLUCONATE 0.12 % MT SOLN: 15.0000 mL | Freq: Once | OROMUCOSAL | Status: AC

## 2020-03-20 MED ORDER — CEFAZOLIN SODIUM-DEXTROSE 2-4 GM/100ML-% IV SOLN: 2.0000 g | INTRAVENOUS | Status: DC

## 2020-03-20 MED ORDER — ACETAMINOPHEN 500 MG PO TABS: 1000.0000 mg | ORAL_TABLET | ORAL | Status: AC

## 2020-03-20 MED ORDER — DEXMEDETOMIDINE HCL IN NACL 80 MCG/20ML IV SOLN
INTRAVENOUS | Status: AC
  Filled 2020-03-20: qty 20

## 2020-03-20 MED ORDER — FAMOTIDINE 20 MG PO TABS
ORAL_TABLET | ORAL | Status: AC
  Administered 2020-03-20: 20 mg via ORAL
  Filled 2020-03-20: qty 1

## 2020-03-20 MED ORDER — CHLORHEXIDINE GLUCONATE 0.12 % MT SOLN
OROMUCOSAL | Status: AC
  Administered 2020-03-20: 15 mL via OROMUCOSAL
  Filled 2020-03-20: qty 15

## 2020-03-20 MED ORDER — CHLORHEXIDINE GLUCONATE CLOTH 2 % EX PADS: 6.0000 | MEDICATED_PAD | Freq: Once | CUTANEOUS | Status: DC

## 2020-03-20 MED ORDER — CEFAZOLIN SODIUM-DEXTROSE 2-4 GM/100ML-% IV SOLN
INTRAVENOUS | Status: AC
  Filled 2020-03-20: qty 100

## 2020-03-20 SURGICAL SUPPLY — 31 items
BAG DECANTER FOR FLEXI CONT (MISCELLANEOUS) ×3 IMPLANT
BLADE SURG SZ11 CARB STEEL (BLADE) ×3 IMPLANT
CANISTER SUCT 1200ML W/VALVE (MISCELLANEOUS) ×3 IMPLANT
CHLORAPREP W/TINT 26 (MISCELLANEOUS) ×3 IMPLANT
COVER LIGHT HANDLE STERIS (MISCELLANEOUS) ×6 IMPLANT
COVER WAND RF STERILE (DRAPES) ×3 IMPLANT
DECANTER SPIKE VIAL GLASS SM (MISCELLANEOUS) ×9 IMPLANT
DERMABOND ADVANCED (GAUZE/BANDAGES/DRESSINGS) ×2
DERMABOND ADVANCED .7 DNX12 (GAUZE/BANDAGES/DRESSINGS) ×1 IMPLANT
DRAPE C-ARM XRAY 36X54 (DRAPES) ×3 IMPLANT
ELECT REM PT RETURN 9FT ADLT (ELECTROSURGICAL) ×3
ELECTRODE REM PT RTRN 9FT ADLT (ELECTROSURGICAL) ×1 IMPLANT
GLOVE SURG SYN 7.0 (GLOVE) ×3 IMPLANT
GLOVE SURG SYN 7.5  E (GLOVE) ×2
GLOVE SURG SYN 7.5 E (GLOVE) ×1 IMPLANT
GOWN STRL REUS W/ TWL LRG LVL3 (GOWN DISPOSABLE) ×2 IMPLANT
GOWN STRL REUS W/TWL LRG LVL3 (GOWN DISPOSABLE) ×4
IV NS 500ML (IV SOLUTION) ×2
IV NS 500ML BAXH (IV SOLUTION) ×1 IMPLANT
KIT TURNOVER KIT A (KITS) ×3 IMPLANT
LABEL OR SOLS (LABEL) ×3 IMPLANT
NEEDLE FILTER BLUNT 18X 1/2SAF (NEEDLE) ×2
NEEDLE FILTER BLUNT 18X1 1/2 (NEEDLE) ×1 IMPLANT
NEEDLE HYPO 22GX1.5 SAFETY (NEEDLE) ×3 IMPLANT
PACK PORT-A-CATH (MISCELLANEOUS) ×3 IMPLANT
SUT MNCRL AB 4-0 PS2 18 (SUTURE) ×3 IMPLANT
SUT PROLENE 3 0 SH DA (SUTURE) ×3 IMPLANT
SUT VIC AB 3-0 SH 27 (SUTURE) ×2
SUT VIC AB 3-0 SH 27X BRD (SUTURE) ×1 IMPLANT
SYR 10ML LL (SYRINGE) ×3 IMPLANT
SYR 3ML LL SCALE MARK (SYRINGE) ×3 IMPLANT

## 2020-03-20 NOTE — OR Nursing (Addendum)
Dr Hampton Abbot aware patient has abd dressing intact from home,  Moderate amount of drainage of old drainage noted, md in assessed wound. Reapplied dressing. Requested md  Assess lung sounds for H&P.

## 2020-03-20 NOTE — Telephone Encounter (Signed)
Patient aware that her appts will be postponed. I will call her with new appts and mail AVS.

## 2020-03-20 NOTE — Interval H&P Note (Signed)
History and Physical Interval Note:  03/20/2020 11:12 AM  Debra [name]  has presented today for surgery, with the diagnosis of colon cancer.  The various methods of treatment have been discussed with the patient and family. After consideration of risks, benefits and other options for treatment, the patient has consented to  Procedure(s): INSERTION PORT-A-CATH, left subclavian (Left) as a surgical intervention.  The patient's history has been reviewed, patient examined, no change in status, stable for surgery.  I have reviewed the patient's chart and labs.  Questions were answered to the patient's satisfaction.     Jerel Sardina

## 2020-03-20 NOTE — Anesthesia Preprocedure Evaluation (Addendum)
Anesthesia Evaluation  Patient identified by MRN, date of birth, ID band Patient awake    Reviewed: Allergy & Precautions, H&P , NPO status , Patient's Chart, lab work & pertinent test results, reviewed documented beta blocker date and time   History of Anesthesia Complications Negative for: history of anesthetic complications  Airway Mallampati: III  TM Distance: >3 FB Neck ROM: full    Dental  (+) Poor Dentition, Dental Advisory Given   Pulmonary neg pulmonary ROS, neg sleep apnea, neg COPD, Patient abstained from smoking.Not current smoker,    Pulmonary exam normal breath sounds clear to auscultation       Cardiovascular Exercise Tolerance: Good METS(-) hypertension(-) angina(-) CAD and (-) Past MI negative cardio ROS Normal cardiovascular exam(-) dysrhythmias  Rhythm:regular Rate:Normal - Systolic murmurs    Neuro/Psych negative neurological ROS  negative psych ROS   GI/Hepatic Neg liver ROS, neg GERD  ,Obstructive colon mass   Endo/Other  neg diabetesHypothyroidism Morbid obesity  Renal/GU negative Renal ROS  negative genitourinary   Musculoskeletal   Abdominal   Peds  Hematology  (+) Blood dyscrasia, anemia ,   Anesthesia Other Findings Past Medical History: No date: Hypothyroidism No date: Thyroid disease History reviewed. No pertinent surgical history.   Reproductive/Obstetrics negative OB ROS                             Anesthesia Physical  Anesthesia Plan  ASA: III  Anesthesia Plan: General   Post-op Pain Management:    Induction: Intravenous  PONV Risk Score and Plan: 4 or greater and Ondansetron, Dexamethasone, Propofol infusion and TIVA  Airway Management Planned: Natural Airway  Additional Equipment: None  Intra-op Plan:   Post-operative Plan:   Informed Consent: I have reviewed the patients History and Physical, chart, labs and discussed the procedure  including the risks, benefits and alternatives for the proposed anesthesia with the patient or authorized representative who has indicated his/her understanding and acceptance.     Dental Advisory Given  Plan Discussed with: CRNA  Anesthesia Plan Comments: (Patient admitted overnight due to abdominal pain and vomiting after attempting to drink her bowel prep. No nausea or vomiting today after getting antiemetics.  Discussed risks of anesthesia with patient, including PONV, sore throat, lip/dental damage. Rare risks discussed as well, such as cardiorespiratory and neurological sequelae. Patient understands. Utilized in-person spanish interpreter for entire preop eval.)       Anesthesia Quick Evaluation

## 2020-03-20 NOTE — Telephone Encounter (Signed)
Patient is scheduled to see MD with poss iron infusion tomorrow.  She did not come for her labs prior.  Dr. Tasia Catchings would like to r/s the lab(prior)/MD/poss Venofer to next week.  Appts for labs sched for 7/16 @ 1p and to RTC for MD/+/-on 7/19 MD at 1115 and Infusion @ 1145

## 2020-03-20 NOTE — OR Nursing (Signed)
Procedure cancelled per Dr Hampton Abbot r/t new onset abdominal infection from previous surgery.  Dressing change per Dr Hampton Abbot- wet/dry and 1/2 wound packing used and covered with abd pad.  Patient will be discharged with new antibiotic order and Md sent new order sheet home with family for home health. Patient already has home health care established. Discharge instructions given to daughter and patient via Dr Hampton Abbot.

## 2020-03-20 NOTE — H&P (View-Only) (Signed)
03/20/20  Patient's midline wound was evaluated at bedside in Preop.  There is a new wound about 1.5 cm diameter that has serous drainage, with surrounding cellulitis.  This is located about 3 cm proximal to the prior midline wound.  This was probed with qtip and there was no purulence.  The previous midline wound that has been getting wound vac dressings is healing well with good granulation tissue.  New dressings applied and new orders sent to [facility] agency.  Superior wound:  1/2 inch iodoform gauze dressing change once daily.  Inferior wound:  Wet to dry gauze dressing change once daily.  No further wound vac is needed.  Cover both with dry gauze or ABD pad and secure with tape.  May change outer gauze as needed to keep area clean and dry.  Given the wound infection, port-a-cath placement surgery has been postponed.  Will reschedule her for 03/28/20 in the afternoon.  Will also give her Augmentin 7 day course for her wound infection.  Olean Ree, MD

## 2020-03-20 NOTE — Progress Notes (Signed)
03/20/20  Patient's midline wound was evaluated at bedside in Preop.  There is a new wound about 1.5 cm diameter that has serous drainage, with surrounding cellulitis.  This is located about 3 cm proximal to the prior midline wound.  This was probed with qtip and there was no purulence.  The previous midline wound that has been getting wound vac dressings is healing well with good granulation tissue.  New dressings applied and new orders sent to [facility] agency.  Superior wound:  1/2 inch iodoform gauze dressing change once daily.  Inferior wound:  Wet to dry gauze dressing change once daily.  No further wound vac is needed.  Cover both with dry gauze or ABD pad and secure with tape.  May change outer gauze as needed to keep area clean and dry.  Given the wound infection, port-a-cath placement surgery has been postponed.  Will reschedule her for 03/28/20 in the afternoon.  Will also give her Augmentin 7 day course for her wound infection.  Olean Ree, MD

## 2020-03-20 NOTE — OR Nursing (Signed)
Fax copy of home health orders to number provided by Luellen Pucker (home health nurse)  Patient discharged to home via wheelchair with daughter.  Discharge instructions provided and instructed to pick up antibiotics and start immediately. Instructions provided per Donnelly Angelica [id number].  Patient and daughter verbalized understanding.

## 2020-03-20 NOTE — OR Nursing (Signed)
Dr Hampton Abbot contacted home health nurse to advise on new treatment plan.

## 2020-03-21 ENCOUNTER — Inpatient Hospital Stay: Payer: Self-pay

## 2020-03-21 ENCOUNTER — Other Ambulatory Visit: Payer: Self-pay

## 2020-03-21 ENCOUNTER — Inpatient Hospital Stay: Payer: Self-pay | Admitting: Oncology

## 2020-03-21 ENCOUNTER — Telehealth: Payer: Self-pay | Admitting: Surgery

## 2020-03-21 NOTE — Telephone Encounter (Signed)
Updated information regarding rescheduled surgery.  Almyra Free with [facility] has advised patient of Pre-Admission date/time, COVID Testing date and Surgery date.  Surgery Date: 03/28/20 Preadmission Testing Date: 03/16/20 (already done) Covid Testing Date: 03/26/20 - patient advised to go to the [facility] between 8a-1p   Patient has been made aware to call [phone], between 1-3:00pm the day before surgery, to find out what time to arrive for surgery.

## 2020-03-23 ENCOUNTER — Other Ambulatory Visit
Admission: RE | Admit: 2020-03-23 | Discharge: 2020-03-23 | Disposition: A | Discharge status: Home / Self Care | Payer: Self-pay | Source: Ambulatory Visit | Attending: Oncology | Admitting: Oncology

## 2020-03-23 ENCOUNTER — Other Ambulatory Visit: Payer: Self-pay

## 2020-03-23 ENCOUNTER — Inpatient Hospital Stay: Payer: Self-pay

## 2020-03-23 ENCOUNTER — Encounter: Payer: Self-pay | Admitting: Surgery

## 2020-03-23 ENCOUNTER — Ambulatory Visit (INDEPENDENT_AMBULATORY_CARE_PROVIDER_SITE_OTHER): Payer: Self-pay | Admitting: Surgery

## 2020-03-23 VITALS — BP 101/68 | HR 99 | Temp 98.9°F | Ht <= 58 in

## 2020-03-23 DIAGNOSIS — C187 Malignant neoplasm of sigmoid colon: Secondary | ICD-10-CM | POA: Insufficient documentation

## 2020-03-23 DIAGNOSIS — T8149XA Infection following a procedure, other surgical site, initial encounter: Secondary | ICD-10-CM

## 2020-03-23 DIAGNOSIS — D500 Iron deficiency anemia secondary to blood loss (chronic): Secondary | ICD-10-CM | POA: Insufficient documentation

## 2020-03-23 DIAGNOSIS — Z7189 Other specified counseling: Secondary | ICD-10-CM | POA: Insufficient documentation

## 2020-03-23 DIAGNOSIS — Z09 Encounter for follow-up examination after completed treatment for conditions other than malignant neoplasm: Secondary | ICD-10-CM

## 2020-03-23 LAB — CBC WITH DIFFERENTIAL/PLATELET
Abs Immature Granulocytes: 0.02 10*3/uL (ref 0.00–0.07)
Basophils Absolute: 0 10*3/uL (ref 0.0–0.1)
Basophils Relative: 0 %
Eosinophils Absolute: 0 10*3/uL (ref 0.0–0.5)
Eosinophils Relative: 0 %
HCT: 29.2 % — ABNORMAL LOW (ref 36.0–46.0)
Hemoglobin: 9.1 g/dL — ABNORMAL LOW (ref 12.0–15.0)
Immature Granulocytes: 0 %
Lymphocytes Relative: 24 %
Lymphs Abs: 1.7 10*3/uL (ref 0.7–4.0)
MCH: 26.2 pg (ref 26.0–34.0)
MCHC: 31.2 g/dL (ref 30.0–36.0)
MCV: 84.1 fL (ref 80.0–100.0)
Monocytes Absolute: 0.8 10*3/uL (ref 0.1–1.0)
Monocytes Relative: 11 %
Neutro Abs: 4.4 10*3/uL (ref 1.7–7.7)
Neutrophils Relative %: 65 %
Platelets: 324 10*3/uL (ref 150–400)
RBC: 3.47 MIL/uL — ABNORMAL LOW (ref 3.87–5.11)
RDW: 19.2 % — ABNORMAL HIGH (ref 11.5–15.5)
WBC: 7 10*3/uL (ref 4.0–10.5)
nRBC: 0 % (ref 0.0–0.2)

## 2020-03-23 LAB — COMPREHENSIVE METABOLIC PANEL
ALT: 14 U/L (ref 0–44)
AST: 19 U/L (ref 15–41)
Albumin: 2.7 g/dL — ABNORMAL LOW (ref 3.5–5.0)
Alkaline Phosphatase: 63 U/L (ref 38–126)
Anion gap: 10 (ref 5–15)
BUN: 6 mg/dL (ref 6–20)
CO2: 22 mmol/L (ref 22–32)
Calcium: 8.1 mg/dL — ABNORMAL LOW (ref 8.9–10.3)
Chloride: 98 mmol/L (ref 98–111)
Creatinine, Ser: 0.47 mg/dL (ref 0.44–1.00)
GFR calc Af Amer: >60 mL/min (ref >60)
GFR calc non Af Amer: >60 mL/min (ref >60)
Glucose, Bld: 100 mg/dL — ABNORMAL HIGH (ref 70–99)
Potassium: 3.6 mmol/L (ref 3.5–5.1)
Sodium: 130 mmol/L — ABNORMAL LOW (ref 135–145)
Total Bilirubin: 0.7 mg/dL (ref 0.3–1.2)
Total Protein: 6.8 g/dL (ref 6.5–8.1)

## 2020-03-23 LAB — IRON AND TIBC
Iron: 14 ug/dL — ABNORMAL LOW (ref 28–170)
Saturation Ratios: 7 % — ABNORMAL LOW (ref 10.4–31.8)
TIBC: 204 ug/dL — ABNORMAL LOW (ref 250–450)
UIBC: 190 ug/dL

## 2020-03-23 LAB — RETIC PANEL
Immature Retic Fract: 19.4 % — ABNORMAL HIGH (ref 2.3–15.9)
RBC.: 3.43 MIL/uL — ABNORMAL LOW (ref 3.87–5.11)
Retic Count, Absolute: 94 10*3/uL (ref 19.0–186.0)
Retic Ct Pct: 2.7 % (ref 0.4–3.1)
Reticulocyte Hemoglobin: 32.1 pg (ref >27.9)

## 2020-03-23 LAB — FERRITIN: Ferritin: 222 ng/mL (ref 11–307)

## 2020-03-23 NOTE — Patient Instructions (Addendum)
Change and repack dressing daily. May use yogurt for diarrhea and fiber. Try to increase your water intake.   Please call and ask to speak with a nurse if you develop questions or concerns.  Surgery 03/28/20 as scheduled.     Cuidado de las heridas, en adultos Wound Care, Adult El cuidado correcto de las heridas puede ayudar a Information systems manager y a [place] infecciones y formacin de cicatrices. Adems, puede ayudar a que la cicatrizacin sea ms rpida. Cmo cuidar la herida Cuidados de la herida      Siga las instrucciones del mdico acerca del cuidado de la herida. Asegrese de hacer lo siguiente: ? Lvese las manos con agua y jabn antes de cambiar la venda (vendaje). Use desinfectante para manos si no dispone de [place] y [place]. ? Cambie el vendaje como se lo haya indicado el mdico. ? No retire los puntos (suturas), la goma para cerrar la piel o las tiras [place]. Es posible que estos cierres cutneos deban quedar puestos en la piel durante 2semanas o ms tiempo. Si los bordes de las tiras adhesivas empiezan a despegarse y Therapist, sports, puede recortar los que estn sueltos. No retire las tiras [facility] por completo a menos que el mdico se lo indique.  Controle la zona de la herida todos los das para detectar signos de infeccin. Est atento a los siguientes signos: ? Enrojecimiento, hinchazn o dolor. ? Lquido o sangre. ? Calor. ? Pus o mal olor.  Pregntele al mdico si debe limpiar la herida con agua y [place]. Hacer esto puede incluir lo siguiente: ? Usar una toalla limpia para secar la herida dando palmaditas despus de limpiarla. No se frote ni restriegue la herida. ? Aplicar una crema o un ungento. Hgalo nicamente como se lo haya indicado el mdico. ? Cubrir la incisin con un vendaje limpio.  Pregntele al mdico cundo puede dejar la herida al descubierto.  Mantenga el vendaje seco hasta que el mdico le diga que se lo puede quitar. No tome baos de  inmersin, nade, use el jacuzzi ni haga ninguna actividad en la que sumerja la herida en agua hasta que el mdico lo autorice. Pregntele al mdico si puede ducharse. Thurston Pounds solo le permitan darse baos de [place]. Medicamentos   Si le recetaron un antibitico, una crema o un ungento con antibitico, tmelo o aplqueselo como se lo haya indicado el mdico. No deje de tomar o de usar el antibitico, aunque la afeccin mejore.  Tome los medicamentos de venta libre y los recetados solamente como se lo haya indicado el mdico. Si le recetaron un analgsico, tmelo al menos 14minutos antes de Optometrist el cuidado de la herida, Civil Service fast streamer se lo haya indicado el mdico. Instrucciones generales  Reanude sus actividades normales segn lo indicado por el mdico. Pregntele al mdico qu actividades son seguras.  No se rasque ni se toque la herida.  No consuma ningn producto que contenga nicotina o tabaco, como cigarrillos y Psychologist, sport and exercise. El consumo de esos productos puede demorar la cicatrizacin de la herida. Si necesita ayuda para dejar de fumar, consulte al mdico.  Concurra a todas las visitas de seguimiento como se lo haya indicado el mdico. Esto es importante.  Siga una dieta que incluya protenas, vitaminas A y C y otros alimentos ricos en nutrientes que ayudarn a que la herida cicatrice. ? Los alimentos ricos en protenas incluyen carne, productos lcteos, frijoles y nueces, entre otras fuentes de protena. ? Los alimentos ricos en vitaminaA incluyen  zanahorias y verduras de hoja verde oscuro. ? Los alimentos ricos en vitaminaC incluyen ctricos, tomates y [place] frutas y verduras. ? Los alimentos ricos en nutrientes tienen protenas, carbohidratos, grasas, vitaminas o minerales. Consuma una variedad de alimentos saludables, que incluya frutas, verduras y Psychologist, prison and probation services. Comunquese con un mdico si:  Le aplicaron la antitetnica y tiene hinchazn, dolor intenso, enrojecimiento  o hemorragia en el sitio de la inyeccin.  El dolor no se alivia con los [employer].  Tiene enrojecimiento, hinchazn o dolor alrededor de la herida.  Observa lquido o sangre que sale de la herida.  La herida se siente caliente al tacto.  Tiene pus o percibe mal olor que emana de la herida.  Tiene fiebre o siente escalofros.  Siente nuseas o vomita.  Tiene mareos. Solicite ayuda de inmediato si:  Tiene una lnea roja que sale de la herida.  Los bordes de la herida se abren y se separan.  La herida sangra y el sangrado no se detiene con una presin [place].  Tiene una erupcin cutnea.  Se desmaya.  Tiene dificultad para respirar. Resumen  Siempre lvese las manos con agua y jabn antes de cambiar la venda (vendaje).  Para ayudar con la cicatrizacin, coma alimentos ricos en protenas, vitaminas A y C y dems nutrientes.  Controle la herida [employer] para detectar signos de infeccin. Comunquese con el mdico si sospecha que la herida est infectada. Esta informacin no tiene Marine scientist el consejo del mdico. Asegrese de hacerle al mdico cualquier pregunta que tenga. Document Revised: 12/07/2017 Document Reviewed: 03/11/2016 Elsevier Patient Education  [place].

## 2020-03-23 NOTE — Progress Notes (Signed)
03/23/2020  HPI: Debra [name] is a 54 y.o. female s/p sigmoidectomy for colon cancer with Dr. Celine Ahr.  She was scheduled for port-a-cath placement on 7/13 but was found to have an infected midline wound.  This was cleaned and she's been doing 1/2 inch gauze packing dressing changes daily with home health RN.  She's also taking Augmentin which was prescribed on 7/13.  Today, she presents for wound check.  She reports the pain is better and the cellulitis is resolving.  No issues with dressing changes.  Vital signs: BP 101/68   Pulse 99   Temp 98.9 F (37.2 C)   Ht 4\' 10"  (1.473 m)   SpO2 97%   BMI 37.62 kg/m    Physical Exam: Constitutional:  No acute distress Abdomen:  Soft, non-distended, non-tender.  Patient has an upper midline wound that is about 2 cm size, 4 cm deep, with some serous drainage.  The surrounding erythema from earlier this week is now almost resolved.  The inferior midline wound is healing well, with healthy granulation tissue throughout.  1/2 inch iodoform gauze packing placed, then wet to dry gauze for the lower wound placed.  Assessment/Plan: This is a 54 y.o. female s/p sigmoidectomy for colon cancer.  --Her wounds are healing better and the infection is resolving.  OK to proceed with port-a-cath next week as scheduled for 7/21.  Discussed with her that Dr. Celine Ahr is back that day and she could do her surgery, but the patient reports she's ok with me doing it.  We'll keep the current schedule.   Melvyn Neth, [facility]

## 2020-03-24 LAB — CEA: CEA: 3 ng/mL (ref 0.0–4.7)

## 2020-03-26 ENCOUNTER — Other Ambulatory Visit
Admission: RE | Admit: 2020-03-26 | Discharge: 2020-03-26 | Disposition: A | Discharge status: Home / Self Care | Payer: HRSA Program | Source: Ambulatory Visit | Attending: Surgery | Admitting: Surgery

## 2020-03-26 ENCOUNTER — Other Ambulatory Visit: Payer: Self-pay

## 2020-03-26 ENCOUNTER — Inpatient Hospital Stay: Payer: Self-pay

## 2020-03-26 ENCOUNTER — Inpatient Hospital Stay: Payer: Self-pay | Admitting: Oncology

## 2020-03-26 DIAGNOSIS — Z20822 Contact with and (suspected) exposure to covid-19: Secondary | ICD-10-CM | POA: Insufficient documentation

## 2020-03-26 DIAGNOSIS — Z01812 Encounter for preprocedural laboratory examination: Secondary | ICD-10-CM | POA: Insufficient documentation

## 2020-03-26 LAB — SARS CORONAVIRUS 2 (TAT 6-24 HRS): SARS Coronavirus 2: NEGATIVE

## 2020-03-28 ENCOUNTER — Ambulatory Visit: Payer: Self-pay

## 2020-03-28 ENCOUNTER — Ambulatory Visit: Payer: Self-pay | Admitting: Certified Registered Nurse Anesthetist

## 2020-03-28 ENCOUNTER — Other Ambulatory Visit: Payer: Self-pay

## 2020-03-28 ENCOUNTER — Encounter: Payer: Self-pay | Admitting: General Surgery

## 2020-03-28 ENCOUNTER — Ambulatory Visit
Admission: RE | Admit: 2020-03-28 | Discharge: 2020-03-28 | Disposition: A | Discharge status: Home / Self Care | Payer: Self-pay | Attending: General Surgery | Admitting: General Surgery

## 2020-03-28 ENCOUNTER — Encounter
Admission: RE | Disposition: A | Discharge status: Home / Self Care | Payer: Self-pay | Source: Home / Self Care | Attending: General Surgery

## 2020-03-28 DIAGNOSIS — J939 Pneumothorax, unspecified: Secondary | ICD-10-CM

## 2020-03-28 DIAGNOSIS — C189 Malignant neoplasm of colon, unspecified: Secondary | ICD-10-CM | POA: Insufficient documentation

## 2020-03-28 DIAGNOSIS — C187 Malignant neoplasm of sigmoid colon: Secondary | ICD-10-CM

## 2020-03-28 HISTORY — PX: PORTACATH PLACEMENT: SHX2246

## 2020-03-28 SURGERY — INSERTION, TUNNELED CENTRAL VENOUS DEVICE, WITH PORT
Anesthesia: General | Laterality: Left

## 2020-03-28 MED ORDER — GABAPENTIN 300 MG PO CAPS
ORAL_CAPSULE | ORAL | Status: AC
  Administered 2020-03-28: 300 mg via ORAL
  Filled 2020-03-28: qty 1

## 2020-03-28 MED ORDER — PROPOFOL 10 MG/ML IV BOLUS
INTRAVENOUS | Status: DC | PRN
  Administered 2020-03-28: 8 mg via INTRAVENOUS
  Administered 2020-03-28: 50 mg via INTRAVENOUS
  Administered 2020-03-28: 30 mg via INTRAVENOUS
  Administered 2020-03-28: 20 mg via INTRAVENOUS

## 2020-03-28 MED ORDER — BUPIVACAINE HCL (PF) 0.25 % IJ SOLN
INTRAMUSCULAR | Status: AC
  Filled 2020-03-28: qty 30

## 2020-03-28 MED ORDER — CHLORHEXIDINE GLUCONATE CLOTH 2 % EX PADS
6.0000 | MEDICATED_PAD | Freq: Once | CUTANEOUS | Status: AC
  Administered 2020-03-28: 6 via TOPICAL

## 2020-03-28 MED ORDER — ONDANSETRON HCL 4 MG/2ML IJ SOLN: 4.0000 mg | Freq: Once | INTRAMUSCULAR | Status: DC | PRN

## 2020-03-28 MED ORDER — FENTANYL CITRATE (PF) 100 MCG/2ML IJ SOLN: 25.0000 ug | INTRAMUSCULAR | Status: DC | PRN

## 2020-03-28 MED ORDER — HEPARIN SODIUM (PORCINE) 5000 UNIT/ML IJ SOLN
INTRAMUSCULAR | Status: AC
  Filled 2020-03-28: qty 1

## 2020-03-28 MED ORDER — EPHEDRINE SULFATE 50 MG/ML IJ SOLN
INTRAMUSCULAR | Status: DC | PRN
  Administered 2020-03-28 (×3): 10 mg via INTRAVENOUS

## 2020-03-28 MED ORDER — GABAPENTIN 300 MG PO CAPS: 300.0000 mg | ORAL_CAPSULE | ORAL | Status: AC

## 2020-03-28 MED ORDER — CHLORHEXIDINE GLUCONATE 0.12 % MT SOLN
OROMUCOSAL | Status: AC
  Administered 2020-03-28: 15 mL via OROMUCOSAL
  Filled 2020-03-28: qty 15

## 2020-03-28 MED ORDER — PROPOFOL 500 MG/50ML IV EMUL
INTRAVENOUS | Status: AC
  Filled 2020-03-28: qty 50

## 2020-03-28 MED ORDER — PROPOFOL 500 MG/50ML IV EMUL
INTRAVENOUS | Status: DC | PRN
  Administered 2020-03-28: 100 ug/kg/min via INTRAVENOUS

## 2020-03-28 MED ORDER — MINERAL OIL LIGHT OIL
TOPICAL_OIL | Status: AC
  Filled 2020-03-28: qty 10

## 2020-03-28 MED ORDER — CHLORHEXIDINE GLUCONATE 0.12 % MT SOLN: 15.0000 mL | Freq: Once | OROMUCOSAL | Status: AC

## 2020-03-28 MED ORDER — PROPOFOL 10 MG/ML IV BOLUS
INTRAVENOUS | Status: AC
  Filled 2020-03-28: qty 20

## 2020-03-28 MED ORDER — ONDANSETRON HCL 4 MG/2ML IJ SOLN
INTRAMUSCULAR | Status: DC | PRN
  Administered 2020-03-28: 4 mg via INTRAVENOUS

## 2020-03-28 MED ORDER — FENTANYL CITRATE (PF) 100 MCG/2ML IJ SOLN
INTRAMUSCULAR | Status: DC | PRN
  Administered 2020-03-28: 50 ug via INTRAVENOUS
  Administered 2020-03-28 (×2): 25 ug via INTRAVENOUS

## 2020-03-28 MED ORDER — FENTANYL CITRATE (PF) 100 MCG/2ML IJ SOLN
INTRAMUSCULAR | Status: AC
  Filled 2020-03-28: qty 2

## 2020-03-28 MED ORDER — CEFAZOLIN SODIUM-DEXTROSE 2-4 GM/100ML-% IV SOLN
INTRAVENOUS | Status: AC
Start: 2020-03-28 — End: 2020-03-28
  Filled 2020-03-28: qty 100

## 2020-03-28 MED ORDER — DEXAMETHASONE SODIUM PHOSPHATE 10 MG/ML IJ SOLN
INTRAMUSCULAR | Status: DC | PRN
  Administered 2020-03-28: 10 mg via INTRAVENOUS

## 2020-03-28 MED ORDER — LIDOCAINE-EPINEPHRINE 1 %-1:100000 IJ SOLN
INTRAMUSCULAR | Status: AC
  Filled 2020-03-28: qty 1

## 2020-03-28 MED ORDER — LIDOCAINE-EPINEPHRINE 1 %-1:100000 IJ SOLN
INTRAMUSCULAR | Status: DC | PRN
  Administered 2020-03-28: 20 mL via INTRAMUSCULAR

## 2020-03-28 MED ORDER — ORAL CARE MOUTH RINSE: 15.0000 mL | Freq: Once | OROMUCOSAL | Status: AC

## 2020-03-28 MED ORDER — LACTATED RINGERS IV SOLN: INTRAVENOUS | Status: DC

## 2020-03-28 MED ORDER — MIDAZOLAM HCL 2 MG/2ML IJ SOLN
INTRAMUSCULAR | Status: AC
  Filled 2020-03-28: qty 2

## 2020-03-28 MED ORDER — CEFAZOLIN SODIUM-DEXTROSE 2-4 GM/100ML-% IV SOLN
2.0000 g | INTRAVENOUS | Status: AC
  Administered 2020-03-28: 2 g via INTRAVENOUS

## 2020-03-28 MED ORDER — FAMOTIDINE 20 MG PO TABS: 20.0000 mg | ORAL_TABLET | Freq: Once | ORAL | Status: AC

## 2020-03-28 MED ORDER — ACETAMINOPHEN 500 MG PO TABS
ORAL_TABLET | ORAL | Status: AC
Start: 2020-03-28 — End: 2020-03-28
  Administered 2020-03-28: 1000 mg via ORAL
  Filled 2020-03-28: qty 2

## 2020-03-28 MED ORDER — FAMOTIDINE 20 MG PO TABS
ORAL_TABLET | ORAL | Status: AC
  Administered 2020-03-28: 20 mg via ORAL
  Filled 2020-03-28: qty 1

## 2020-03-28 MED ORDER — PHENYLEPHRINE HCL (PRESSORS) 10 MG/ML IV SOLN
INTRAVENOUS | Status: DC | PRN
  Administered 2020-03-28: 50 ug via INTRAVENOUS
  Administered 2020-03-28: 100 ug via INTRAVENOUS

## 2020-03-28 MED ORDER — ACETAMINOPHEN 500 MG PO TABS: 1000.0000 mg | ORAL_TABLET | ORAL | Status: AC

## 2020-03-28 MED ORDER — MIDAZOLAM HCL 5 MG/5ML IJ SOLN
INTRAMUSCULAR | Status: DC | PRN
Start: 2020-03-28 — End: 2020-03-28
  Administered 2020-03-28 (×2): 1 mg via INTRAVENOUS

## 2020-03-28 MED ORDER — EPHEDRINE 5 MG/ML INJ
INTRAVENOUS | Status: AC
  Filled 2020-03-28: qty 10

## 2020-03-28 SURGICAL SUPPLY — 36 items
ADH SKN CLS APL DERMABOND .7 (GAUZE/BANDAGES/DRESSINGS) ×1
APL PRP STRL LF DISP 70% ISPRP (MISCELLANEOUS) ×1
BAG DECANTER FOR FLEXI CONT (MISCELLANEOUS) ×4 IMPLANT
BLADE SURG 15 STRL LF DISP TIS (BLADE) ×1 IMPLANT
BLADE SURG 15 STRL SS (BLADE) ×2
BOOT SUTURE AID YELLOW STND (SUTURE) ×2 IMPLANT
CANISTER SUCT 1200ML W/VALVE (MISCELLANEOUS) ×2 IMPLANT
CHLORAPREP W/TINT 26 (MISCELLANEOUS) ×2 IMPLANT
COVER WAND RF STERILE (DRAPES) ×2 IMPLANT
DERMABOND ADVANCED (GAUZE/BANDAGES/DRESSINGS) ×1
DERMABOND ADVANCED .7 DNX12 (GAUZE/BANDAGES/DRESSINGS) ×1 IMPLANT
DRAPE C-ARM XRAY 36X54 (DRAPES) ×2 IMPLANT
ELECT CAUTERY BLADE 6.4 (BLADE) ×2 IMPLANT
ELECT REM PT RETURN 9FT ADLT (ELECTROSURGICAL) ×2
ELECTRODE REM PT RTRN 9FT ADLT (ELECTROSURGICAL) ×1 IMPLANT
GAUZE PACKING IODOFORM 1X5 (PACKING) ×2 IMPLANT
GLOVE BIO SURGEON STRL SZ 6.5 (GLOVE) ×2 IMPLANT
GLOVE INDICATOR 7.0 STRL GRN (GLOVE) ×2 IMPLANT
GOWN STRL REUS W/ TWL LRG LVL3 (GOWN DISPOSABLE) ×2 IMPLANT
GOWN STRL REUS W/TWL LRG LVL3 (GOWN DISPOSABLE) ×4
IV NS 500ML (IV SOLUTION) ×4
IV NS 500ML BAXH (IV SOLUTION) ×2 IMPLANT
KIT PORT POWER 8FR ISP CVUE (Port) ×2 IMPLANT
NEEDLE FILTER BLUNT 18X 1/2SAF (NEEDLE) ×1
NEEDLE FILTER BLUNT 18X1 1/2 (NEEDLE) ×1 IMPLANT
NEEDLE HYPO 22GX1.5 SAFETY (NEEDLE) ×2 IMPLANT
PACK PORT-A-CATH (MISCELLANEOUS) ×2 IMPLANT
SUT MNCRL 4-0 (SUTURE) ×2
SUT MNCRL 4-0 27XMFL (SUTURE) ×1
SUT PROLENE 2 0 SH DA (SUTURE) ×4 IMPLANT
SUT VIC AB 3-0 SH 27 (SUTURE) ×2
SUT VIC AB 3-0 SH 27X BRD (SUTURE) ×1 IMPLANT
SUTURE MNCRL 4-0 27XMF (SUTURE) ×1 IMPLANT
SYR 10ML LL (SYRINGE) ×2 IMPLANT
SYR 20ML LL LF (SYRINGE) ×2 IMPLANT
SYR 3ML LL SCALE MARK (SYRINGE) ×2 IMPLANT

## 2020-03-28 NOTE — Transfer of Care (Signed)
Immediate Anesthesia Transfer of Care Note  Patient: Debra [name]  Procedure(s) Performed: INSERTION PORT-A-CATH (Left )  Patient Location: PACU  Anesthesia Type:General  Level of Consciousness: awake, alert  and patient cooperative  Airway & Oxygen Therapy: Patient Spontanous Breathing  Post-op Assessment: Report given to RN and Post -op Vital signs reviewed and stable  Post vital signs: Reviewed and stable  Last Vitals:  Vitals Value Taken Time  BP 94/60 03/28/20 1400  Temp    Pulse 86 03/28/20 1403  Resp 28 03/28/20 1403  SpO2 94 % 03/28/20 1403  Vitals shown include unvalidated device data.  Last Pain:  Vitals:   03/28/20 1009  TempSrc: Tympanic  PainSc: 0-No pain         Complications: No complications documented.

## 2020-03-28 NOTE — Anesthesia Procedure Notes (Signed)
Date/Time: 03/28/2020 11:50 AM Performed by: Doreen Salvage, CRNA Pre-anesthesia Checklist: Patient identified, Emergency Drugs available, Suction available and Patient being monitored Patient Re-evaluated:Patient Re-evaluated prior to induction Oxygen Delivery Method: Simple face mask Induction Type: IV induction Dental Injury: Teeth and Oropharynx as per pre-operative assessment

## 2020-03-28 NOTE — Interval H&P Note (Signed)
History and Physical Interval Note:  03/28/2020 9:58 AM  Debra [name]  has presented today for surgery, with the diagnosis of colon cancer.  The various methods of treatment have been discussed with the patient and family. After consideration of risks, benefits and other options for treatment, the patient has consented to  Procedure(s): INSERTION PORT-A-CATH (Left) as a surgical intervention.  The patient's history has been reviewed, patient examined, no change in status, stable for surgery.  I have reviewed the patient's chart and labs.  Questions were answered to the patient's satisfaction.     [name]

## 2020-03-28 NOTE — Op Note (Signed)
Pre-operative Diagnosis:  Post-operative Diagnosis:    Surgeon: [name], MD  Anesthesia: IV sedation, bupivicaine 0.25% and lidocaine 1% with epinephrine  Procedure:  left IJ port placement with fluoroscopy under U/S guidance  Findings: Needle and wire visualized within target vessel by ultrasound Good position of the tip of the catheter by fluoroscopy  Estimated Blood Loss: Minimal         Drains: None         Specimens: None       Complications: The initially placed port did not flush or draw back.  During the attempts to troubleshoot this, the catheter came completely out of the vessel and a second port placement was required.  The catheter from the second port initially kinked, but once this was rectified, it flushed and withdrew blood easily.         Procedure Details  The patient was seen again in the preoperative holding area. The benefits, complications, treatment options, and expected outcomes were discussed with the patient. The risks of bleeding, infection, recurrence of symptoms, failure to resolve symptoms, thrombosis, nonfunction, breakage, pneumothorax, and/or hemopneumothorax, any of which could require chest tube or further surgery were reviewed with the patient. The patient agreed to accept these risks. The patient was taken to the operating room, identified as Laurenashley Viar and the procedure verified. A time out was held and the above information confirmed.  Prior to the induction of general anesthesia, antibiotic prophylaxis was administered. VTE prophylaxis was in place. Appropriate anesthesia was then administered and tolerated well. The chest was prepped with Chloraprep and draped in standard sterile fashion. The patient was positioned in the supine position. Then the patient was placed in Trendelenburg position.  Local anesthetic was infiltrated into the skin and subcutaneous tissues in the neck and anterior chest wall. A large bore needle was  placed into the internal jugular vein under U/S guidance without difficulty and then the Seldinger wire was advanced. Fluoroscopy was utilized to confirm that the Seldinger wire was in the superior vena cava.  An incision was made and a port pocket developed with blunt and electrocautery dissection. The introducer dilator was placed over the Seldinger wire and the wire was removed. The previously flushed catheter was placed into the introducer dilator and the peel-away sheath was removed. The catheter length was confirmed and trimmed utilizing fluoroscopy for proper positioning.  The catheter was then tunneled underneath the skin to the port pocket.  The catheter was then attached to the previously flushed port. The port was placed into the pocket. The port was secured in situ with 2-0 Prolene sutures, flushed to confirm function and heparin locked.  The first port placed did not flush.  Imaging suggested a kink in the catheter at the venous insertion site.  I attempted to straighten this out and provide a smoother contour to the tubing, but as I did so, the catheter came completely free from the vein.  I repeated the access process as discussed above.  At this time, as I tunneled the catheter subcutaneously, I took care to open the angle of the tunnel more.  Fluoroscopy confirmed that there was no kink in the tubing.  I then restitched the port in its pocket.  It then flushed easily drew back easily and was heparin locked.  The wound was closed with interrupted 3-0 Vicryl followed by 4-0 subcuticular Monocryl sutures. Dermabond was applied followed by Steri-Strips.  The patient was taken to the recovery room in stable condition  where a postoperative chest film has been obtained.  My initial review of the film did not demonstrate any pneumothorax and the port appears to be in good position.

## 2020-03-28 NOTE — Anesthesia Postprocedure Evaluation (Signed)
Anesthesia Post Note  Patient: Debra [name]  Procedure(s) Performed: INSERTION PORT-A-CATH (Left )  Patient location during evaluation: PACU Anesthesia Type: General Level of consciousness: awake and alert and oriented Pain management: pain level controlled Vital Signs Assessment: post-procedure vital signs reviewed and stable Respiratory status: spontaneous breathing, nonlabored ventilation and respiratory function stable Cardiovascular status: blood pressure returned to baseline and stable Postop Assessment: no signs of nausea or vomiting Anesthetic complications: no   No complications documented.   Last Vitals:  Vitals:   03/28/20 1431 03/28/20 1448  BP: 92/63 96/64  Pulse: 85 86  Resp: (!) 0   Temp: (!) 36.3 C (!) 36.2 C  SpO2: 95% 98%    Last Pain:  Vitals:   03/28/20 1448  TempSrc: Temporal  PainSc: 0-No pain                 Dawanda Mapel

## 2020-03-28 NOTE — Progress Notes (Signed)
Per Dr. Randa Lynn, ok to d/c pt from PACU to post op.  VSS, NAD.

## 2020-03-28 NOTE — OR Nursing (Signed)
Per Dr. Celine Ahr, secure chat, no follow up with her, just with Oncology.

## 2020-03-28 NOTE — Anesthesia Preprocedure Evaluation (Signed)
Anesthesia Evaluation  Patient identified by MRN, date of birth, ID band Patient awake    Reviewed: Allergy & Precautions, NPO status , Patient's Chart, lab work & pertinent test results  History of Anesthesia Complications Negative for: history of anesthetic complications  Airway Mallampati: III  TM Distance: >3 FB Neck ROM: Full    Dental  (+) Implants   Pulmonary neg pulmonary ROS, neg sleep apnea, neg COPD,    breath sounds clear to auscultation- rhonchi (-) wheezing      Cardiovascular (-) hypertension(-) CAD, (-) Past MI, (-) Cardiac Stents and (-) CABG  Rhythm:Regular Rate:Normal - Systolic murmurs and - Diastolic murmurs    Neuro/Psych neg Seizures negative neurological ROS  negative psych ROS   GI/Hepatic negative GI ROS, Neg liver ROS,   Endo/Other  neg diabetesHypothyroidism   Renal/GU negative Renal ROS     Musculoskeletal negative musculoskeletal ROS (+)   Abdominal (+) + obese,   Peds  Hematology  (+) anemia ,   Anesthesia Other Findings Past Medical History: No date: Anemia No date: Hypothyroidism No date: Thyroid disease   Reproductive/Obstetrics                             Anesthesia Physical Anesthesia Plan  ASA: II  Anesthesia Plan: General   Post-op Pain Management:    Induction: Intravenous  PONV Risk Score and Plan: 2 and Propofol infusion  Airway Management Planned: Natural Airway  Additional Equipment:   Intra-op Plan:   Post-operative Plan:   Informed Consent: I have reviewed the patients History and Physical, chart, labs and discussed the procedure including the risks, benefits and alternatives for the proposed anesthesia with the patient or authorized representative who has indicated his/her understanding and acceptance.     Dental advisory given  Plan Discussed with: CRNA and Anesthesiologist  Anesthesia Plan Comments:          Anesthesia Quick Evaluation

## 2020-03-29 ENCOUNTER — Encounter: Payer: Self-pay | Admitting: General Surgery

## 2020-04-02 ENCOUNTER — Telehealth: Payer: Self-pay | Admitting: Surgery

## 2020-04-02 NOTE — Telephone Encounter (Signed)
Verbal authorization given for new orders for home health.

## 2020-04-02 NOTE — Telephone Encounter (Signed)
Incoming call from Debra [name] with [facility] [phone]).  They need approval for wound care changes.  She states that the abdominal wound is healing and the daughter can at this point care for the wound changes daily.   [facility] at this point asking to do the wound changes once a week for three weeks.  Please advise and call her.  Thank you.

## 2020-04-08 DIAGNOSIS — C189 Malignant neoplasm of colon, unspecified: Secondary | ICD-10-CM

## 2020-04-08 HISTORY — DX: Malignant neoplasm of colon, unspecified: C18.9

## 2020-04-12 ENCOUNTER — Inpatient Hospital Stay: Payer: Self-pay | Attending: Oncology

## 2020-04-12 NOTE — Progress Notes (Signed)
Nutrition  Patient had nutrition follow-up appointment today at 2:30pm.  Received a call from registration at 3:12pm saying that patient had arrived for nutrition appointment.  RD unable to see patient at this late time.  Informed registration that patient would need to be rescheduled.    Lucendia Leard B. Zenia Resides, [place], [place] Registered Dietitian [phone] (mobile)

## 2020-04-13 ENCOUNTER — Telehealth: Payer: Self-pay | Admitting: Surgery

## 2020-04-13 NOTE — Telephone Encounter (Signed)
Spoke to the patient's home health nurse regarding concerns over wound care at home. She reported that the superior wound they were following the patient for had completely healed. However, on her assessment yesterday there was concern that an area on her medial laparotomy wound had dehisced superficially. She noted this was about "1/2 inch" deep. She did not describe the length of dehiscence. She did not describe any concerns for fascial dehiscence. There was drainage from this wound noted which she described as tan in color and thin.  No overlaying erythema or tenderness. No fevers and VSS per report.   Plan:  -- I instructed to pack with iodoform gauze daily and cover with gauze/ABD pad  -- I will hold off on ABx for now as there are no other signs of infection -- Continue HH RN -- Recommend follow early next week with myself or Dr Celine Ahr for wound reassessment  -- [name], PA-C [facility] 04/13/2020, 12:59 PM [phone] M-F: 7am - 4pm

## 2020-04-13 NOTE — Telephone Encounter (Signed)
Please call Luellen Pucker with [facility] at [phone].  She is needing instructions on wound care and changes.  Thank you.

## 2020-04-17 ENCOUNTER — Other Ambulatory Visit: Payer: Self-pay

## 2020-04-17 ENCOUNTER — Encounter: Payer: Self-pay | Admitting: General Surgery

## 2020-04-17 ENCOUNTER — Ambulatory Visit (INDEPENDENT_AMBULATORY_CARE_PROVIDER_SITE_OTHER): Payer: Self-pay | Admitting: General Surgery

## 2020-04-17 VITALS — BP 121/83 | HR 76 | Temp 98.3°F | Ht <= 58 in | Wt 168.0 lb

## 2020-04-17 DIAGNOSIS — T8149XA Infection following a procedure, other surgical site, initial encounter: Secondary | ICD-10-CM

## 2020-04-17 NOTE — Progress Notes (Signed)
[name] is here today for a wound check.  She is a 54 year old woman who underwent a partial colectomy for what proved to be colon cancer.  Her postoperative course was complicated by a wound infection.  This was treated with local wound care and antibiotics.  The site healed and she was getting ready to undergo port placement for her chemotherapy, when another location opened up and began draining.  Once again, this was treated with dressing changes and antibiotics.  She had her port placed, but has not yet begun chemotherapy.  She contacted our office on Friday, stating that a new area of the wound had opened.  She feels like it occurred after straining to have a bowel movement.  She was not have any fevers or chills.  No nausea or vomiting.  No antibiotics were prescribed at that time and she was instructed to pack the wound with half-inch iodoform.  She continues to endorse mild bilateral lower quadrant pain, which she is able to manage adequately with ibuprofen. Her daughter has been changing her dressing daily.  She is here today to have the wound inspected.  Today's visit was held with the assistance of a Spanish language interpreter, Leola Brazil.      Today's Vitals   04/17/20 1406  BP: 121/83  Pulse: 76  Temp: 98.3 F (36.8 C)  SpO2: 98%  Weight: 168 lb (76.2 kg)  Height: 4\' 10"  (1.473 m)   Body mass index is 35.11 kg/m. Focused exam: The wound is dressed, but there is light gray-tan drainage seeping through.  The dressing was removed.  A single gauze sponge is present on the surface.  There is thin, opaque light tan drainage without odor saturating the gauze.  Iodoform packing was removed from an approximately 1 cm hole just caudal to the umbilicus.  A small amount of drainage present that was expressed.  The wound was probed.  The fascia beneath is intact.  The wound does not track cranially or caudally.  There is hypertrophic granulation tissue along the midline as well as surrounding  this new opening.  I also inspected the port site.  Her Steri-Strips were still in place.  These were removed.  The incision has healed well.  There is no surrounding erythema or induration.  The Dermabond is still in place.  Impression and plan: This is a 54 year old woman who had a colectomy for obstructing colon cancer.  She has had some postoperative wound complications and has had a new tract opened up.  I suspect, that there was an existing pocket of undrained pus that worked its way to the surface.  I treated the granulation tissue with silver nitrate sticks and repacked the wound with half-inch iodoform.  I covered the incision with gauze and an ABD pad.  The patient does have some irritated skin from tape and I encouraged her to keep this area dry.  She was encouraged to avoid constipation and take MiraLAX daily.  I would like to have her seen either by myself or by our physician's assistant in about 10 days to assess her healing.  If she develops any fevers or chills or if the drainage increases, she should contact our office for sooner visit.

## 2020-04-17 NOTE — Patient Instructions (Addendum)
Change your dressing and packing once a day.  You may use gentle soap and water to the skin. You may shower, but remove the dressing and packing first. Rinse the area well and pat dry then replace the dressing and packing.   Follow up here in 10 days.   Call if you start to develop any fevers or bad chills.

## 2020-04-20 ENCOUNTER — Encounter: Payer: Self-pay | Admitting: General Surgery

## 2020-04-26 ENCOUNTER — Other Ambulatory Visit: Payer: Self-pay

## 2020-04-26 ENCOUNTER — Ambulatory Visit (INDEPENDENT_AMBULATORY_CARE_PROVIDER_SITE_OTHER): Payer: Self-pay | Admitting: Physician Assistant

## 2020-04-26 ENCOUNTER — Ambulatory Visit
Admission: RE | Admit: 2020-04-26 | Discharge: 2020-04-26 | Disposition: A | Discharge status: Home / Self Care | Payer: Self-pay | Source: Ambulatory Visit | Attending: Physician Assistant | Admitting: Physician Assistant

## 2020-04-26 ENCOUNTER — Telehealth: Payer: Self-pay | Admitting: Physician Assistant

## 2020-04-26 ENCOUNTER — Other Ambulatory Visit: Payer: Self-pay | Admitting: Physician Assistant

## 2020-04-26 ENCOUNTER — Encounter: Payer: Self-pay | Admitting: Physician Assistant

## 2020-04-26 ENCOUNTER — Other Ambulatory Visit
Admission: RE | Admit: 2020-04-26 | Discharge: 2020-04-26 | Disposition: A | Discharge status: Home / Self Care | Payer: Self-pay | Source: Ambulatory Visit | Attending: Physician Assistant | Admitting: Physician Assistant

## 2020-04-26 ENCOUNTER — Telehealth: Payer: Self-pay | Admitting: Emergency Medicine

## 2020-04-26 VITALS — BP 114/75 | HR 86 | Temp 98.8°F | Resp 12 | Wt 169.0 lb

## 2020-04-26 DIAGNOSIS — C187 Malignant neoplasm of sigmoid colon: Secondary | ICD-10-CM

## 2020-04-26 DIAGNOSIS — T8149XA Infection following a procedure, other surgical site, initial encounter: Secondary | ICD-10-CM

## 2020-04-26 DIAGNOSIS — T8143XA Infection following a procedure, organ and space surgical site, initial encounter: Secondary | ICD-10-CM

## 2020-04-26 DIAGNOSIS — Z09 Encounter for follow-up examination after completed treatment for conditions other than malignant neoplasm: Secondary | ICD-10-CM

## 2020-04-26 LAB — BASIC METABOLIC PANEL
Anion gap: 7 (ref 5–15)
BUN: 7 mg/dL (ref 6–20)
CO2: 27 mmol/L (ref 22–32)
Calcium: 8.8 mg/dL — ABNORMAL LOW (ref 8.9–10.3)
Chloride: 101 mmol/L (ref 98–111)
Creatinine, Ser: 0.45 mg/dL (ref 0.44–1.00)
GFR calc Af Amer: >60 mL/min (ref >60)
GFR calc non Af Amer: >60 mL/min (ref >60)
Glucose, Bld: 113 mg/dL — ABNORMAL HIGH (ref 70–99)
Potassium: 4 mmol/L (ref 3.5–5.1)
Sodium: 135 mmol/L (ref 135–145)

## 2020-04-26 LAB — CBC WITH DIFFERENTIAL/PLATELET
Abs Immature Granulocytes: 0.01 10*3/uL (ref 0.00–0.07)
Basophils Absolute: 0 10*3/uL (ref 0.0–0.1)
Basophils Relative: 0 %
Eosinophils Absolute: 0.1 10*3/uL (ref 0.0–0.5)
Eosinophils Relative: 2 %
HCT: 32.4 % — ABNORMAL LOW (ref 36.0–46.0)
Hemoglobin: 10.6 g/dL — ABNORMAL LOW (ref 12.0–15.0)
Immature Granulocytes: 0 %
Lymphocytes Relative: 32 %
Lymphs Abs: 1.5 10*3/uL (ref 0.7–4.0)
MCH: 28.7 pg (ref 26.0–34.0)
MCHC: 32.7 g/dL (ref 30.0–36.0)
MCV: 87.8 fL (ref 80.0–100.0)
Monocytes Absolute: 0.5 10*3/uL (ref 0.1–1.0)
Monocytes Relative: 12 %
Neutro Abs: 2.5 10*3/uL (ref 1.7–7.7)
Neutrophils Relative %: 54 %
Platelets: 200 10*3/uL (ref 150–400)
RBC: 3.69 MIL/uL — ABNORMAL LOW (ref 3.87–5.11)
RDW: 16.5 % — ABNORMAL HIGH (ref 11.5–15.5)
WBC: 4.7 10*3/uL (ref 4.0–10.5)
nRBC: 0 % (ref 0.0–0.2)

## 2020-04-26 MED ORDER — IOHEXOL 300 MG/ML  SOLN
100.0000 mL | Freq: Once | INTRAMUSCULAR | Status: AC | PRN
  Administered 2020-04-26: 100 mL via INTRAVENOUS

## 2020-04-26 NOTE — Patient Instructions (Addendum)
You will have to get a CT Abdomen and Pelvis scan. Please head over to the [facility] at this time to get your scan done. Do NOT eat or drink anything at this time before your scan.   You will also need to get lab work today. You will go to the Lab located in the Medical mall.   Thedore Mins will call you with further instructions.

## 2020-04-26 NOTE — Progress Notes (Signed)
[facility] POST-OP OFFICE VISIT  04/26/2020  HPI: Debra [name] is a 54 y.o. female just under 3 months s/p open left hemicolectomy for what proved to be colon cancer and ~1 month s/p port-a-cath placement with Dr Celine Ahr. She has had multiple issues with wound infections most recently on 08/10 she was seen by Dr Celine Ahr after a new area of drainage began near the umbilicus. Local wound care was preformed. No antibiotics were initiated. She presents today for follow up of the same.   Patient's daughter helps provide the history this morning.  Of most concern, patient's daughter reports that her mom has had intermittent fevers in the last few days. This highest measured was reportedly 104F yesterday. She did not attempt to contact her [facility] or our office regarding this. She has been using tylenol and motrin which has helped. She is afebrile in our office this morning.   Additionally, the patient has continued to have copious drainage from her midline wound despite constant packing. This opening has been patent x3 weeks now. Patients daughter notes "she ate a cucumber yesterday and a seed came out of the wound with the dressing change."   No nausea or emesis. She is mobilizing within reason. Appetite is decreased. Miralax is helping with constipation.    Vital signs: BP 114/75   Pulse 86   Temp 98.8 F (37.1 C)   Resp 12   Wt 169 lb (76.7 kg)   SpO2 100%   BMI 35.32 kg/m    Physical Exam: Constitutional: Well appearing female, NAD Abdomen: Abdomen is soft, I do not appreciate any tenderness nor distension. No rebound, guarding, peritonitis. She has a midline laparotomy incision which is well healed aside from approximately 1 cm opening inferior to the umbilicus. There is copious amounts yellow-green non-odorous drainage from the wound. This does not appear overtly bilious nor purulent. There surrounding tissue is without erythema. Probing with q-tip revealed intact  fascia in all directions.   Assessment/Plan: This is a 54 y.o. female just under 3 months s/p open left hemicolectomy for what proved to be colon cancer and ~1 month s/p port-a-cath placement with Dr Celine Ahr. She has had multiple issues with wound infections most recently on 08/10   - I am not sure what to make of her reported fevers at home, but if she is truly running fevers to 104 as daughter reports, that is highly concerning despite being afebrile here. Additionally, I do not think her midline wound drainage is overtly bilious nor purulent and fascia appears intact on my examination, but given the increase in the amount of drainage my biggest concern wound be enterocutaneous fistula. This is highly unlikely 3 months out from initial surgery, and I suspect this is not the case but given her reported fevers, I think it is reasonable to obtain a CT Abdomen/Pelvis with PO and IV contrast today. I will also send her for CBC and BMP.   - Continue wound care with packing; I preformed this today  - We will follow up closely on results of work up today.   -- [name], PA-C [facility] 04/26/2020, 10:40 AM [phone] M-F: 7am - 4pm

## 2020-04-26 NOTE — Telephone Encounter (Signed)
Called Daughter Mickel Baas and gave her CT results per [place], [place].   Trenton Gammon that Interventional Radiology team is going to call her to schedule an appt for mother to have drain put in place tomorrow at the hospital.  Secondly, advised Mickel Baas that Thedore Mins wanted pt to have a Barium Enema done and scheduling will call her to make an appointment as well.  Lastly; f/u appt was made to come see Dr Celine Ahr on 05/01/20 at [facility] to call the office if she has any questions or concerns. Also advised her to take mother to ER if she starts getting high fevers. Daughter voiced understanding and has no further questions.

## 2020-04-26 NOTE — Telephone Encounter (Addendum)
CT Abdomen/Pelvis (04/26/2020) personally reviewed concerning for complex LLQ intra-abdominal abscess with fistulous tracking through abdomen and to midline wound, and radiologist report reviewed: IMPRESSION: 1. Postoperative changes of recent left hemicolectomy with complex abscesses and fistulous tracts in the low anatomic pelvis extending to the skin surface, as above. In addition, there is an abscess in the subcutaneous fat of the lower anterior abdominal wall which also communicates with the skin surface via a fistulous tract. 2. Additional incidental findings, as above.  -- I reviewed this with the other MDs readily available on the surgery team (Dr Celine Ahr is off today) and spoke on the phone with Dr Jarvis Newcomer (interventional Radiology) who is in agreement with proceeding with CT Guided Percutaneous Drainage. She is not septic nor toxic from this and I think it is reasonable to proceed with this as an outpatient. Labs this morning are reassuring.   -- We will plan for CT Guided Percutaneous Drainage tomorrow if feasible. I have spoken with IR MD and scheduler for this. Orders are placed.  -- Will also obtain BE to evaluate anastomosis and rule out fistula from this area. I have spoken with radiology department regarding this and unfortunately can not be completed at the same time as the drain. I do think the drain take precedence. Tentatively, we will try and schedule this for Tuesday morning.  -- I do not think she is currently septic from this and does not warrant admission at this time. If she develops true fevers at home, she was encouraged to present to the ED.  -- Our office will contact the patient, and her family, to update on findings and let them know the plan of care outlined above.  -- Once this is completed I anticipate close follow up next week (08/24) with Dr Celine Ahr, or sooner if needed.   -- [name], PA-C [facility] 04/26/2020, 4:00  PM [phone] M-F: 7am - 4pm

## 2020-04-27 ENCOUNTER — Other Ambulatory Visit: Payer: Self-pay

## 2020-04-27 ENCOUNTER — Ambulatory Visit
Admission: RE | Admit: 2020-04-27 | Discharge: 2020-04-27 | Disposition: A | Discharge status: Home / Self Care | Payer: Self-pay | Source: Ambulatory Visit | Attending: Physician Assistant | Admitting: Physician Assistant

## 2020-04-27 DIAGNOSIS — T8143XA Infection following a procedure, organ and space surgical site, initial encounter: Secondary | ICD-10-CM | POA: Insufficient documentation

## 2020-04-27 DIAGNOSIS — L02211 Cutaneous abscess of abdominal wall: Secondary | ICD-10-CM | POA: Insufficient documentation

## 2020-04-27 DIAGNOSIS — K651 Peritoneal abscess: Secondary | ICD-10-CM | POA: Insufficient documentation

## 2020-04-27 LAB — PROTIME-INR
INR: 1.1 (ref 0.8–1.2)
Prothrombin Time: 13.5 seconds (ref 11.4–15.2)

## 2020-04-27 LAB — CBC
HCT: 32.2 % — ABNORMAL LOW (ref 36.0–46.0)
Hemoglobin: 10.4 g/dL — ABNORMAL LOW (ref 12.0–15.0)
MCH: 28.7 pg (ref 26.0–34.0)
MCHC: 32.3 g/dL (ref 30.0–36.0)
MCV: 89 fL (ref 80.0–100.0)
Platelets: 195 10*3/uL (ref 150–400)
RBC: 3.62 MIL/uL — ABNORMAL LOW (ref 3.87–5.11)
RDW: 16 % — ABNORMAL HIGH (ref 11.5–15.5)
WBC: 4.7 10*3/uL (ref 4.0–10.5)
nRBC: 0 % (ref 0.0–0.2)

## 2020-04-27 MED ORDER — CEFAZOLIN SODIUM-DEXTROSE 2-4 GM/100ML-% IV SOLN
2.0000 g | Freq: Once | INTRAVENOUS | Status: AC
  Administered 2020-04-27: 2 g via INTRAVENOUS
  Filled 2020-04-27: qty 100

## 2020-04-27 MED ORDER — FENTANYL CITRATE (PF) 100 MCG/2ML IJ SOLN
INTRAMUSCULAR | Status: AC | PRN
  Administered 2020-04-27: 50 ug via INTRAVENOUS

## 2020-04-27 MED ORDER — FENTANYL CITRATE (PF) 100 MCG/2ML IJ SOLN
INTRAMUSCULAR | Status: AC
  Filled 2020-04-27: qty 2

## 2020-04-27 MED ORDER — CEFAZOLIN SODIUM-DEXTROSE 2-4 GM/100ML-% IV SOLN
INTRAVENOUS | Status: AC
  Filled 2020-04-27: qty 100

## 2020-04-27 MED ORDER — SODIUM CHLORIDE 0.9% FLUSH: 5.0000 mL | Freq: Three times a day (TID) | INTRAVENOUS | Status: DC

## 2020-04-27 MED ORDER — SODIUM CHLORIDE 0.9 % IV SOLN: INTRAVENOUS | Status: DC

## 2020-04-27 MED ORDER — MIDAZOLAM HCL 2 MG/2ML IJ SOLN
INTRAMUSCULAR | Status: AC
  Filled 2020-04-27: qty 2

## 2020-04-27 MED ORDER — MIDAZOLAM HCL 2 MG/2ML IJ SOLN
INTRAMUSCULAR | Status: AC | PRN
  Administered 2020-04-27: 1 mg via INTRAVENOUS

## 2020-04-27 NOTE — Procedures (Signed)
Interventional Radiology Procedure Note  Procedure: CT Guided Drainage of peritoneal abscess  Complications: None  Estimated Blood Loss: < 10 mL  Findings: 10 Fr drain placed in LLQ abscess with return of purulent, bloody fluid. Fluid sample sent for culture analysis. Drain attached to suction bulb drainage.  Will follow.  Venetia Night. Kathlene Cote, M.D Pager:  [phone]

## 2020-04-27 NOTE — Progress Notes (Signed)
Patient remains stable clinically with daughter at bedside. Denies complaints at this time . JP/dressing care done with daughter and patient with questions answered. Told to flush with saline 82ml every day as well as dressing change every 3 days or if soiled. Stated understanding.

## 2020-04-27 NOTE — Discharge Instructions (Signed)
Percutaneous Abscess Drain, Care After This sheet gives you information about how to care for yourself after your procedure. Your health care provider may also give you more specific instructions. If you have problems or questions, contact your health care provider. What can I expect after the procedure? After your procedure, it is common to have:  A small amount of bruising and discomfort in the area where the drainage tube (catheter) was placed.  Sleepiness and fatigue. This should go away after the medicines you were given have worn off. Follow these instructions at home: Incision care  Follow instructions from your health care provider about how to take care of your incision. Make sure you: ? Wash your hands with soap and water before you change your bandage (dressing). If soap and water are not available, use hand sanitizer. ? Change your dressing as told by your health care provider. ? Leave stitches (sutures), skin glue, or adhesive strips in place. These skin closures may need to stay in place for 2 weeks or longer. If adhesive strip edges start to loosen and curl up, you may trim the loose edges. Do not remove adhesive strips completely unless your health care provider tells you to do that.  Check your incision area every day for signs of infection. Check for: ? More redness, swelling, or pain. ? More fluid or blood. ? Warmth. ? Pus or a bad smell. ? Fluid leaking from around your catheter (instead of fluid draining through your catheter). Catheter care   Follow instructions from your health care provider about emptying and cleaning your catheter and collection bag. You may need to clean the catheter every day so it does not clog.  If directed, write down the following information every time you empty your bag: ? The date and time. ? The amount of drainage. General instructions  Rest at home for 1-2 days after your procedure. Return to your normal activities as told by your  health care provider.  Do not take baths, swim, or use a hot tub for 24 hours after your procedure, or until your health care provider says that this is okay.  Take over-the-counter and prescription medicines only as told by your health care provider.  Keep all follow-up visits as told by your health care provider. This is important. Contact a health care provider if:  You have less than 10 mL of drainage a day for 2-3 days in a row, or as directed by your health care provider.  You have more redness, swelling, or pain around your incision area.  You have more fluid or blood coming from your incision area.  Your incision area feels warm to the touch.  You have pus or a bad smell coming from your incision area.  You have fluid leaking from around your catheter (instead of through your catheter).  You have a fever or chills.  You have pain that does not get better with medicine. Get help right away if:  Your catheter comes out.  You suddenly stop having drainage from your catheter.  You suddenly have blood in the fluid that is draining from your catheter.  You become dizzy or you faint.  You develop a rash.  You have nausea or vomiting.  You have difficulty breathing or you feel short of breath.  You develop chest pain.  You have problems with your speech or vision.  You have trouble balancing or moving your arms or legs. Summary  It is common to have a small   amount of bruising and discomfort in the area where the drainage tube (catheter) was placed.  You may be directed to record the amount of drainage from the bag every time you empty it.  Follow instructions from your health care provider about emptying and cleaning your catheter and collection bag. This information is not intended to replace advice given to you by your health care provider. Make sure you discuss any questions you have with your health care provider. Document Revised: 08/07/2017 Document  Reviewed: 07/17/2016 Elsevier Patient Education  2020 [employer].

## 2020-04-27 NOTE — Progress Notes (Signed)
Patient clinically stable post Abscess Drain placement per Dr Kathlene Cote, tolerated well. Thick Dk red drainage to bulb JP drain. Awake/alert and oriented post procedure. Denies complaints at this time. Interpreter present for discharge instructions as well as family member. Vitals stable pre and post procedure. Report given to [employer] post procedure in specials.

## 2020-04-30 LAB — AEROBIC/ANAEROBIC CULTURE W GRAM STAIN (SURGICAL/DEEP WOUND): Special Requests: NORMAL

## 2020-05-01 ENCOUNTER — Ambulatory Visit (INDEPENDENT_AMBULATORY_CARE_PROVIDER_SITE_OTHER): Payer: Self-pay | Admitting: General Surgery

## 2020-05-01 ENCOUNTER — Ambulatory Visit
Admission: RE | Admit: 2020-05-01 | Discharge: 2020-05-01 | Disposition: A | Discharge status: Home / Self Care | Payer: Self-pay | Source: Ambulatory Visit | Attending: Physician Assistant | Admitting: Physician Assistant

## 2020-05-01 ENCOUNTER — Encounter: Payer: Self-pay | Admitting: General Surgery

## 2020-05-01 ENCOUNTER — Other Ambulatory Visit: Payer: Self-pay

## 2020-05-01 VITALS — BP 127/84 | HR 62 | Temp 98.1°F | Ht 59.0 in | Wt 172.4 lb

## 2020-05-01 DIAGNOSIS — T8143XA Infection following a procedure, organ and space surgical site, initial encounter: Secondary | ICD-10-CM

## 2020-05-01 NOTE — Patient Instructions (Addendum)
Drain Output Sheet was provided for patient at today's visit. Patient advised to continue with flushing patient's drain every 3 days, and patient may apply Vitamin E Oil to the area to prevent skin dryness.   Cuidado de las heridas, en adultos Wound Care, Adult El cuidado correcto de las heridas puede ayudar a Information systems manager y a [place] infecciones y formacin de cicatrices. Adems, puede ayudar a que la cicatrizacin sea ms rpida. Cmo cuidar la herida Cuidados de la herida      Siga las instrucciones del mdico acerca del cuidado de la herida. Asegrese de hacer lo siguiente: ? Lvese las manos con agua y jabn antes de cambiar la venda (vendaje). Use desinfectante para manos si no dispone de [place] y [place]. ? Cambie el vendaje como se lo haya indicado el mdico. ? No retire los puntos (suturas), la goma para cerrar la piel o las tiras Archie. Es posible que estos cierres cutneos deban quedar puestos en la piel durante 2semanas o ms tiempo. Si los bordes de las tiras adhesivas empiezan a despegarse y Therapist, sports, puede recortar los que estn sueltos. No retire las tiras [facility] por completo a menos que el mdico se lo indique.  Controle la zona de la herida todos los das para detectar signos de infeccin. Est atento a los siguientes signos: ? Enrojecimiento, hinchazn o dolor. ? Lquido o sangre. ? Calor. ? Pus o mal olor.  Pregntele al mdico si debe limpiar la herida con agua y [place]. Hacer esto puede incluir lo siguiente: ? Usar una toalla limpia para secar la herida dando palmaditas despus de limpiarla. No se frote ni restriegue la herida. ? Aplicar una crema o un ungento. Hgalo nicamente como se lo haya indicado el mdico. ? Cubrir la incisin con un vendaje limpio.  Pregntele al mdico cundo puede dejar la herida al descubierto.  Mantenga el vendaje seco hasta que el mdico le diga que se lo puede quitar. No tome baos de inmersin, nade, use el  jacuzzi ni haga ninguna actividad en la que sumerja la herida en agua hasta que el mdico lo autorice. Pregntele al mdico si puede ducharse. Thurston Pounds solo le permitan darse baos de [place]. Medicamentos   Si le recetaron un antibitico, una crema o un ungento con antibitico, tmelo o aplqueselo como se lo haya indicado el mdico. No deje de tomar o de usar el antibitico, aunque la afeccin mejore.  Tome los medicamentos de venta libre y los recetados solamente como se lo haya indicado el mdico. Si le recetaron un analgsico, tmelo al menos 41minutos antes de Optometrist el cuidado de la herida, Civil Service fast streamer se lo haya indicado el mdico. Instrucciones generales  Reanude sus actividades normales segn lo indicado por el mdico. Pregntele al mdico qu actividades son seguras.  No se rasque ni se toque la herida.  No consuma ningn producto que contenga nicotina o tabaco, como cigarrillos y Psychologist, sport and exercise. El consumo de esos productos puede demorar la cicatrizacin de la herida. Si necesita ayuda para dejar de fumar, consulte al mdico.  Concurra a todas las visitas de seguimiento como se lo haya indicado el mdico. Esto es importante.  Siga una dieta que incluya protenas, vitaminas A y C y otros alimentos ricos en nutrientes que ayudarn a que la herida cicatrice. ? Los alimentos ricos en protenas incluyen carne, productos lcteos, frijoles y nueces, entre otras fuentes de protena. ? Los alimentos ricos en vitaminaA incluyen zanahorias y verduras de hoja verde oscuro. ?  Los alimentos ricos en vitaminaC incluyen ctricos, tomates y [place] frutas y verduras. ? Los alimentos ricos en nutrientes tienen protenas, carbohidratos, grasas, vitaminas o minerales. Consuma una variedad de alimentos saludables, que incluya frutas, verduras y Psychologist, prison and probation services. Comunquese con un mdico si:  Le aplicaron la antitetnica y tiene hinchazn, dolor intenso, enrojecimiento o hemorragia en el  sitio de la inyeccin.  El dolor no se alivia con los [employer].  Tiene enrojecimiento, hinchazn o dolor alrededor de la herida.  Observa lquido o sangre que sale de la herida.  La herida se siente caliente al tacto.  Tiene pus o percibe mal olor que emana de la herida.  Tiene fiebre o siente escalofros.  Siente nuseas o vomita.  Tiene mareos. Solicite ayuda de inmediato si:  Tiene una lnea roja que sale de la herida.  Los bordes de la herida se abren y se separan.  La herida sangra y el sangrado no se detiene con una presin [place].  Tiene una erupcin cutnea.  Se desmaya.  Tiene dificultad para respirar. Resumen  Siempre lvese las manos con agua y jabn antes de cambiar la venda (vendaje).  Para ayudar con la cicatrizacin, coma alimentos ricos en protenas, vitaminas A y C y dems nutrientes.  Controle la herida [employer] para detectar signos de infeccin. Comunquese con el mdico si sospecha que la herida est infectada. Esta informacin no tiene Marine scientist el consejo del mdico. Asegrese de hacerle al mdico cualquier pregunta que tenga. Document Revised: 12/07/2017 Document Reviewed: 03/11/2016 Elsevier Patient Education  [place].

## 2020-05-01 NOTE — Progress Notes (Signed)
Debra [name] is here today for ongoing management of a postoperative wound infection.  She underwent an open sigmoid colectomy for what proved to be colon cancer on Jan 30, 2020.  The postoperative course was complicated by superficial wound infection.  She subsequently had a port placed on July 21, after a delay due to another superficial wound infection.  She was seen last week in clinic by our physicians assistant.  She had reported fevers to 104 degrees at home and continued to have copious purulent drainage from the opening in her wound.  A CT scan of the abdomen and pelvis was performed that demonstrated fluid collections with some gas and fistulous connection to the wound.  She underwent an image guided drain placement last week.  She also underwent a barium enema today to evaluate for anastomotic leak.  Today, she states that she has not had any fevers at home since the drain was placed.  The drain continues to put out seropurulent fluid, but she has not been recording the daily output.  There is approximately 5 cc in the bulb today and she says that this is from 730 this morning.  She reports better energy levels and that she is eating better, as her appetite has improved.  Both she and her daughter feel like the drainage from the abdominal incision has decreased.  Today's Vitals   05/01/20 1403  BP: 127/84  Pulse: 62  Temp: 98.1 F (36.7 C)  TempSrc: Oral  SpO2: 100%  Weight: 172 lb 6.4 oz (78.2 kg)  Height: 4\' 11"  (1.499 m)  PainSc: 0-No pain  PainLoc: Abdomen   Body mass index is 34.82 kg/m. Focused examination of the abdomen demonstrates iodoform packing with in a approximately 1.5 centimeter opening just caudal to the umbilicus.  There is grayish-yellow pus without odor on the dressing.  I repacked the wound and dressed the site.  We provided the patient and her daughter with a recording sheet so they can keep track of the amount her drain is putting out.  We will schedule her for  a follow-up appointment with our PA, Debra [name], next week for ongoing follow-up.  She may require a repeat CT scan to confirm that all fluid collections are adequately drained prior to removing her drain.

## 2020-05-04 ENCOUNTER — Telehealth: Payer: Self-pay

## 2020-05-04 NOTE — Telephone Encounter (Signed)
Tried to call and no answer. Will try again. Please mail appt reminder. Thanks

## 2020-05-04 NOTE — Telephone Encounter (Signed)
Patient notified of appts.  

## 2020-05-04 NOTE — Telephone Encounter (Signed)
Done  Labs on 05/17/20 @ 10:45 RTC on 05/18/20 MD @ 1:00/ +/-  Venofer @ 130

## 2020-05-04 NOTE — Telephone Encounter (Signed)
Please schedule patient for follow up in 2 weeks:   Lab/MD/ venofer with labs 1-2 days prior.   I will call pt with appts.

## 2020-05-09 ENCOUNTER — Encounter: Payer: Self-pay | Admitting: Physician Assistant

## 2020-05-09 ENCOUNTER — Ambulatory Visit (INDEPENDENT_AMBULATORY_CARE_PROVIDER_SITE_OTHER): Payer: Self-pay | Admitting: Physician Assistant

## 2020-05-09 ENCOUNTER — Other Ambulatory Visit: Payer: Self-pay

## 2020-05-09 VITALS — BP 121/73 | HR 59 | Temp 98.1°F | Ht 59.0 in | Wt 172.0 lb

## 2020-05-09 DIAGNOSIS — T8143XA Infection following a procedure, organ and space surgical site, initial encounter: Secondary | ICD-10-CM

## 2020-05-09 DIAGNOSIS — Z09 Encounter for follow-up examination after completed treatment for conditions other than malignant neoplasm: Secondary | ICD-10-CM

## 2020-05-09 DIAGNOSIS — T8149XA Infection following a procedure, other surgical site, initial encounter: Secondary | ICD-10-CM

## 2020-05-09 MED ORDER — AMOXICILLIN-POT CLAVULANATE 875-125 MG PO TABS: 1.0000 | ORAL_TABLET | Freq: Two times a day (BID) | ORAL | 0 refills | Status: AC

## 2020-05-09 NOTE — Progress Notes (Signed)
[facility] POST-OP OFFICE VISIT  05/09/2020  HPI: Debra [name] is a 54 y.o. female ~3 months s/p open left hemicolectomy for what proved to be colon cancer and ~1.5 month s/p port-a-cath placement with Dr Celine Ahr. She has had multiple issues with wound infections post-operatively. I saw her on 08/19 and sent her for CT which revealed intra-abdominal abscess and fistulous communication with the skin. She had drain placed on 08/20. She followed up with Dr Celine Ahr on 08/24 and was doing better.   From an abdominal standpoint, she continues to slowly improve. She notes less drainage from her umbilical wound and her appetite has continued to improve. She continues to have seropurulent output from the drain and is not recording this at home. She has about 30 ccs in bulb at the time of my evaluation. No fevers or chills at home.   However, she has noticed an increase in what she described as serous drainage from her port site. This has started recently in the last few days. No erythema or warm around this but she is concerned over the wound.    Vital signs: BP 121/73   Pulse (!) 59   Temp 98.1 F (36.7 C)   Ht 4\' 11"  (1.499 m)   Wt 172 lb (78 kg)   SpO2 99%   BMI 34.74 kg/m    Physical Exam: Constitutional: She appears more energetic this morning than my previous encounters, NAD Abdomen: Soft, non-tender, non-distended. Umbilical wound appears smaller and requiring less packing, drainage slowing from this, no erythema. Percutaneous drain in left abdomen with seropurulent output Skin: Port site to the left upper chest. This appears to have started to dehisce in a few areas and the port is visible. There is an expected level or erythema and what appears to be mostly serous fluid. The remainder of the port and tubing is palpable through the skin and there is no erythema elsewhere   Left Upper Chest Port (05/09/2020):     Assessment/Plan: This is a 54 y.o. female ~3  months s/p open left hemicolectomy for what proved to be colon cancer and ~1.5 month s/p port-a-cath placement   - I do not think her port site is actively infected however I am worried that this wound has dehisced and she is set up for infection. As a precaution I will start her on Augmentin x10 days and discussed local wound care and dressings to help protect this and give it a chance to heal. I do not think it needs acutely removed but I am concerned about the long-term viability of this. I reviewed the signs and symptoms of infections and return precautions.    - She should continue local wound care to her umbilical wound as she is doing currently  - Continue to monitor percutaneous drain output  - She will benefit from re-imaging once drainage slows and clears up more  - She will rtc in 1 week to continue to closely follow her port site  -- [name], PA-C [facility] 05/09/2020, 12:23 PM [phone] M-F: 7am - 4pm

## 2020-05-09 NOTE — Patient Instructions (Signed)
Take all of you antibiotics.  Follow up here in one week with Dr Celine Ahr

## 2020-05-15 ENCOUNTER — Telehealth: Payer: Self-pay | Admitting: General Surgery

## 2020-05-15 NOTE — Telephone Encounter (Signed)
Daughter, Mickel Baas calls stating that she visited her mom yesterday and it appears that there is some infection around the wound area with redness and oozing yellow.  She does have an appointment with Dr. Celine Ahr Thursday Sept 9th.  Please call her. Thank you.

## 2020-05-15 NOTE — Telephone Encounter (Signed)
Called and spoke to daughter Mickel Baas. Per Mickel Baas, patient is doing good, denies f, chills, n, v. Other than that daughter states she can see the port-a-cath through the skin. Per Mickel Baas, the skin is red and states there is oozing, minimal oozing. Patient was seen on 9/1 in clinic with Zach. I spoke to Thedore Mins about this worrisome daughter had. Thedore Mins states it was addressed on this visit and patient was instructed to cover it with a dry gauze. I informed Mickel Baas about this and she is understandable and states she just wanted to be cautious and let us know. I advised Mickel Baas that Thedore Mins wanted patient to see Dr Celine Ahr on thursday in regards to this matter. Pt voiced understanding and has no other concerns. Advised pt to call the office if questions or concerns arise.

## 2020-05-16 ENCOUNTER — Other Ambulatory Visit: Payer: Self-pay

## 2020-05-16 DIAGNOSIS — D500 Iron deficiency anemia secondary to blood loss (chronic): Secondary | ICD-10-CM

## 2020-05-17 ENCOUNTER — Inpatient Hospital Stay: Payer: Self-pay

## 2020-05-17 ENCOUNTER — Other Ambulatory Visit: Payer: Self-pay

## 2020-05-17 ENCOUNTER — Encounter: Payer: Self-pay | Admitting: General Surgery

## 2020-05-17 ENCOUNTER — Inpatient Hospital Stay: Payer: Self-pay | Attending: Oncology

## 2020-05-17 ENCOUNTER — Ambulatory Visit (INDEPENDENT_AMBULATORY_CARE_PROVIDER_SITE_OTHER): Payer: Self-pay | Admitting: General Surgery

## 2020-05-17 VITALS — BP 127/84 | HR 69 | Temp 98.4°F | Ht 59.0 in | Wt 174.0 lb

## 2020-05-17 DIAGNOSIS — Z791 Long term (current) use of non-steroidal anti-inflammatories (NSAID): Secondary | ICD-10-CM | POA: Insufficient documentation

## 2020-05-17 DIAGNOSIS — K5900 Constipation, unspecified: Secondary | ICD-10-CM | POA: Insufficient documentation

## 2020-05-17 DIAGNOSIS — K651 Peritoneal abscess: Secondary | ICD-10-CM | POA: Insufficient documentation

## 2020-05-17 DIAGNOSIS — R5383 Other fatigue: Secondary | ICD-10-CM | POA: Insufficient documentation

## 2020-05-17 DIAGNOSIS — C187 Malignant neoplasm of sigmoid colon: Secondary | ICD-10-CM

## 2020-05-17 DIAGNOSIS — T8143XA Infection following a procedure, organ and space surgical site, initial encounter: Secondary | ICD-10-CM

## 2020-05-17 DIAGNOSIS — D500 Iron deficiency anemia secondary to blood loss (chronic): Secondary | ICD-10-CM | POA: Insufficient documentation

## 2020-05-17 DIAGNOSIS — E039 Hypothyroidism, unspecified: Secondary | ICD-10-CM | POA: Insufficient documentation

## 2020-05-17 DIAGNOSIS — Z8616 Personal history of COVID-19: Secondary | ICD-10-CM | POA: Insufficient documentation

## 2020-05-17 DIAGNOSIS — Z79899 Other long term (current) drug therapy: Secondary | ICD-10-CM | POA: Insufficient documentation

## 2020-05-17 DIAGNOSIS — Z9049 Acquired absence of other specified parts of digestive tract: Secondary | ICD-10-CM | POA: Insufficient documentation

## 2020-05-17 LAB — COMPREHENSIVE METABOLIC PANEL
ALT: 22 U/L (ref 0–44)
AST: 35 U/L (ref 15–41)
Albumin: 4 g/dL (ref 3.5–5.0)
Alkaline Phosphatase: 92 U/L (ref 38–126)
Anion gap: 10 (ref 5–15)
BUN: 8 mg/dL (ref 6–20)
CO2: 26 mmol/L (ref 22–32)
Calcium: 9.3 mg/dL (ref 8.9–10.3)
Chloride: 103 mmol/L (ref 98–111)
Creatinine, Ser: 0.45 mg/dL (ref 0.44–1.00)
GFR calc Af Amer: >60 mL/min (ref >60)
GFR calc non Af Amer: >60 mL/min (ref >60)
Glucose, Bld: 96 mg/dL (ref 70–99)
Potassium: 3.7 mmol/L (ref 3.5–5.1)
Sodium: 139 mmol/L (ref 135–145)
Total Bilirubin: 0.6 mg/dL (ref 0.3–1.2)
Total Protein: 8.2 g/dL — ABNORMAL HIGH (ref 6.5–8.1)

## 2020-05-17 LAB — CBC WITH DIFFERENTIAL/PLATELET
Abs Immature Granulocytes: 0 10*3/uL (ref 0.00–0.07)
Basophils Absolute: 0 10*3/uL (ref 0.0–0.1)
Basophils Relative: 1 %
Eosinophils Absolute: 0.1 10*3/uL (ref 0.0–0.5)
Eosinophils Relative: 4 %
HCT: 37.8 % (ref 36.0–46.0)
Hemoglobin: 12.4 g/dL (ref 12.0–15.0)
Immature Granulocytes: 0 %
Lymphocytes Relative: 41 %
Lymphs Abs: 1.5 10*3/uL (ref 0.7–4.0)
MCH: 29.1 pg (ref 26.0–34.0)
MCHC: 32.8 g/dL (ref 30.0–36.0)
MCV: 88.7 fL (ref 80.0–100.0)
Monocytes Absolute: 0.3 10*3/uL (ref 0.1–1.0)
Monocytes Relative: 8 %
Neutro Abs: 1.8 10*3/uL (ref 1.7–7.7)
Neutrophils Relative %: 46 %
Platelets: 150 10*3/uL (ref 150–400)
RBC: 4.26 MIL/uL (ref 3.87–5.11)
RDW: 14.4 % (ref 11.5–15.5)
WBC: 3.7 10*3/uL — ABNORMAL LOW (ref 4.0–10.5)
nRBC: 0 % (ref 0.0–0.2)

## 2020-05-17 LAB — IRON AND TIBC
Iron: 63 ug/dL (ref 28–170)
Saturation Ratios: 17 % (ref 10.4–31.8)
TIBC: 375 ug/dL (ref 250–450)
UIBC: 312 ug/dL

## 2020-05-17 LAB — FERRITIN: Ferritin: 46 ng/mL (ref 11–307)

## 2020-05-17 NOTE — Patient Instructions (Addendum)
Dr.Cannon advised patient to just apply a gauze to the wound and STOP packing the area. Dr.Cannon advised patient to remove packing and gauze before taking a shower, pat dry and then apply gauze and packing to wounds. Patient is scheduled for CT Abdomen Pelvis with Contrast on Monday September 13 th at 2:30pm arrive at 2:15pm. Patient instructions: Patient is NOT to eat or drink anything 4 hours prior to scan.   [facility]  [address] [place], [place] [address] [phone]  GENERAL POST-OPERATIVE PATIENT INSTRUCTIONS   WOUND CARE INSTRUCTIONS:  Keep a dry clean dressing on the wound if there is drainage. The initial bandage may be removed after 24 hours.  Once the wound has quit draining you may leave it open to air.  If clothing rubs against the wound or causes irritation and the wound is not draining you may cover it with a dry dressing during the daytime.  Try to keep the wound dry and avoid ointments on the wound unless directed to do so.  If the wound becomes bright red and painful or starts to drain infected material that is not clear, please contact your physician immediately.  If the wound is mildly pink and has a thick firm ridge underneath it, this is normal, and is referred to as a healing ridge.  This will resolve over the next 4-6 weeks.  BATHING: You may shower if you have been informed of this by your surgeon. However, Please do not submerge in a tub, hot tub, or pool until incisions are completely sealed or have been told by your surgeon that you may do so.  DIET:  You may eat any foods that you can tolerate.  It is a good idea to eat a high fiber diet and take in plenty of fluids to prevent constipation.  If you do become constipated you may want to take a mild laxative or take ducolax tablets on a daily basis until your bowel habits are regular.  Constipation can be very uncomfortable, along with straining, after recent surgery.  ACTIVITY:   You are encouraged to cough and deep breath or use your incentive spirometer if you were given one, every 15-30 minutes when awake.  This will help prevent respiratory complications and low grade fevers post-operatively if you had a general anesthetic.  You may want to hug a pillow when coughing and sneezing to add additional support to the surgical area, if you had abdominal or chest surgery, which will decrease pain during these times.  You are encouraged to walk and engage in light activity for the next two weeks.  You should not lift more than 20 pounds, until 4 to 6 weeks after surgery as it could put you at increased risk for complications.  Twenty pounds is roughly equivalent to a plastic bag of groceries. At that time- Listen to your body when lifting, if you have pain when lifting, stop and then try again in a few days. Soreness after doing exercises or activities of daily living is normal as you get back in to your normal routine.  MEDICATIONS:  Try to take narcotic medications and anti-inflammatory medications, such as tylenol, ibuprofen, naprosyn, etc., with food.  This will minimize stomach upset from the medication.  Should you develop nausea and vomiting from the pain medication, or develop a rash, please discontinue the medication and contact your physician.  You should not drive, make important decisions, or operate machinery when taking narcotic pain medication.  SUNBLOCK Use sun block to incision area over the next year if this area will be exposed to sun. This helps decrease scarring and will allow you avoid a permanent darkened area over your incision.  QUESTIONS:  Please feel free to call our office if you have any questions, and we will be glad to assist you. [phone].

## 2020-05-17 NOTE — Progress Notes (Signed)
Nutrition Follow-up:  Patient with colon cancer, s/p sigmoid colectomy with primary anastomosis with post op complications of ileus, on TPN, wound infection.  Planning to start chemotherapy after wound is healed.   Met with patient and daughter today in clinic with interpreter, [place].  Patient reports improved appetite from last visit.  Reports that she is eating 2 meals per day sometimes 1 meal.  Reports that breakfast is usually milk and bread, likes cookies and juice.  Yesterday ate beans and tortillas with water and juice and tomato soup.  Drinking ensure shakes sometimes but not every day.    Reports that she is not taking miralax and bowels are moving, no constipation.  Last bowel movement yesterday.    Medications: reviewed  Labs: reviewed  Anthropometrics:   Weight 173 lb 8 oz today in clinic decreased from 180 lb on 7/01   NUTRITION DIAGNOSIS: Inadequate oral intake improving   INTERVENTION:  Stressed importance of including foods that contain protein at every meal.  Reviewed foods that contain protein.   Provided Nutrition for the Person with Cancer During Treatment for patient in [place].   Encouraged oral nutrition supplement daily.  Samples given and coupons Contact information provided    MONITORING, EVALUATION, GOAL: weight trends, intake   NEXT VISIT: October 7th in clinic  [place]. Zenia Resides, [place], [place] Registered Dietitian [phone] (mobile)

## 2020-05-17 NOTE — Progress Notes (Signed)
Mrs. Debra [name] is here today for ongoing follow-up of a number of postsurgical complications.  She was seen by our physicians assistant on September 1 for follow-up of both an intra-abdominal abscess as well as a wound infection.  At that visit, there were additional concerns about the appearance of her port site.  She is here today for reevaluation.  She continues to improve from an abdominal perspective.  Her daughter states that the periumbilical opening has closed and there is no more purulent drainage from the site.  Her drain is putting out between 5 to 10 cc of seropurulent fluid per day, per the patient's report; the output is not being recorded regularly.  Today's appointment was conducted with the assistance of a Spanish language interpreter.  Today's Vitals   05/17/20 1517  BP: 127/84  Pulse: 69  Temp: 98.4 F (36.9 C)  TempSrc: Oral  SpO2: 96%  Weight: 174 lb (78.9 kg)  Height: 4\' 11"  (1.499 m)   Body mass index is 35.14 kg/m. Examination of the abdominal wound demonstrates complete epithelialization of the tract without any additional drainage present.  There is seropurulent fluid in the drain bulb.  The port is visible within an open area of skin overlying it.  Impression and plan: This is a 54 year old woman who underwent a sigmoid colectomy for what proved to be colon cancer.  She also had a Port-A-Cath placed about 6 weeks ago.  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  Her abdominal incision has improved greatly.  Due to the number of deep wound infections that we have been dealing with, I would like to have her undergo a repeat CT scan of the abdomen and pelvis before I remove the drain.  I will also remove her port today in clinic as I am concerned about the potential for infection tracking from the open site into the bloodstream.  This can be replaced at a later  date, once all infectious concerns have resolved.  After obtaining consent, the skin surrounding the open port site was cleaned with chlorhexidine.  1% lidocaine with epinephrine was infiltrated around the port.  The open skin area was opened further and with minimal pressure, the port simply slipped out of the pocket.  The catheter was removed intact and the wound site packed, as there was insufficient skin for closure without undue tension.  In light of the patient's very poor wound healing, I did not feel comfortable attempting to close the site.

## 2020-05-18 ENCOUNTER — Encounter: Payer: Self-pay | Admitting: Oncology

## 2020-05-18 ENCOUNTER — Inpatient Hospital Stay: Payer: Self-pay

## 2020-05-18 ENCOUNTER — Inpatient Hospital Stay (HOSPITAL_BASED_OUTPATIENT_CLINIC_OR_DEPARTMENT_OTHER): Payer: Self-pay | Admitting: Oncology

## 2020-05-18 VITALS — BP 126/83 | HR 65 | Temp 97.0°F | Resp 18 | Wt 175.5 lb

## 2020-05-18 DIAGNOSIS — C187 Malignant neoplasm of sigmoid colon: Secondary | ICD-10-CM

## 2020-05-18 DIAGNOSIS — K651 Peritoneal abscess: Secondary | ICD-10-CM

## 2020-05-18 DIAGNOSIS — D500 Iron deficiency anemia secondary to blood loss (chronic): Secondary | ICD-10-CM

## 2020-05-18 MED ORDER — DOCUSATE SODIUM 100 MG PO CAPS: 100.0000 mg | ORAL_CAPSULE | Freq: Every day | ORAL | 1 refills | Status: DC

## 2020-05-18 NOTE — Progress Notes (Signed)
Patient here for follow up. Pt saw Dr. Celine Ahr yesterday and port was removed due to infection. Pt on antibiotics. Pt reports she will get port placed again once abdominal would has healed.

## 2020-05-18 NOTE — Progress Notes (Signed)
Hematology/Oncology Progress Note [facility] [phone] [phone]  Patient Care Team: [name], MD as PCP - General (Family Medicine) [name], RN as Registered Nurse [name], RN as Registered Nurse   Name of the patient: Debra [name]  [id number]  05-01-1966  Date of visit: 05/18/20  PERTINENT ONCOLOGY HISTORY Debra [name] is a 54 y.o.afemale who has above oncology history reviewed by me today presented for follow up visit for management of Stage IIIB Colon cancer seen by me on 11/21/2019 during her hospitalization. Patient has a history of Covid positivity. Severe iron deficiency status post IV Venofer treatment. Colonoscopy 11/21/2019 showed a malignant appearing severe stenosis found in the sigmoid colon at 25 cm proximal to the anus and was nontransversed.  Biopsy was taken.  Surgical pathology is pending  Nonbleeding external hemorrhoids Biopsy was not diagnostic 01/03/2020 virtual colonoscopy showed long segment area of wall thickening and annular constriction within the sigmoid colon in the area of previous concern most compatible with annular malignancy.  Remainder of the colon evaluation is somewhat limited due to large amount of retained stool and incomplete distention proximal to the annular constricting lesion. 01/30/2020 Patient underwent left hemicolectomy by Dr. Celine Ahr and a biopsy showed invasive adenocarcinoma, poorly differentiated.  37 lymph nodes negative for malignancy.  Grade 3, all margins are uninvolved.  Perineural invasion present.  Tumor deposit present. pT3 pN1c cM0 Checks x-ray did not show any lung mass. #Postop, patient developed postop ileus and she required TPN, PICC and NG tube placement.  Postoperation day 9 she continued to have increased leukocytosis and was subsequently found to have a midline wound infection to the inferior portion of the wound.  Patient was treated with  antibiotics.  INTERVAL HISTORY-  54 y.o. female presents for management of colon cancer. Unfortunately she has experienced postsurgical complications including wound infection as well as intraabdominal abscess.  Her Mediport also recently had purulent drainage from the sites and was taken out by Dr. Celine Ahr.  Patient still has drainage tube intra-abdominal abscess.  Her wound infection has improved and periumbilical opening closed. Dr. Celine Ahr plans to repeat CT scan next week prior to discontinuing of the drain tube.  Patient reports feeling better.  Fatigue has improved.  She did not take oral iron supplementation due to constipation. Constipation, patient uses MiraLAX as needed  Review of systems- Review of Systems  Constitutional: Positive for fatigue. Negative for unexpected weight change.  Respiratory: Negative for shortness of breath.   Cardiovascular: Negative for chest pain.  Gastrointestinal: Positive for constipation. Negative for abdominal distention and abdominal pain.       Wound vac  Genitourinary: Negative for dysuria.   Skin: Positive for wound. Negative for rash.  Neurological: Negative for headaches and light-headedness.  Hematological: Negative for adenopathy.  Psychiatric/Behavioral: Negative for confusion.    No Known Allergies  Patient Active Problem List   Diagnosis Date Noted  . Loss of weight 02/15/2020  . Goals of care, counseling/discussion 02/15/2020  . Malignant neoplasm of sigmoid colon ([place]) 02/15/2020  . Pressure injury of skin 02/03/2020  . Bowel obstruction ([place]) 01/29/2020  . Iron deficiency anemia due to chronic blood loss 11/22/2019  . COVID-19 virus detected 11/20/2019  . Symptomatic anemia 11/20/2019  . Hypothyroidism 11/20/2019  . Iron deficiency anemia 11/20/2019  . GIB (gastrointestinal bleeding) 11/20/2019     Past Medical History:  Diagnosis Date  . Anemia   . Hypothyroidism   . Thyroid disease  Past Surgical History:    Procedure Laterality Date  . COLONOSCOPY WITH PROPOFOL N/A 11/21/2019   Procedure: COLONOSCOPY WITH PROPOFOL;  Surgeon: Lin Landsman, MD;  Location: [facility] ENDOSCOPY;  Service: Gastroenterology;  Laterality: N/A;  . CYSTOSCOPY WITH STENT PLACEMENT Left 01/30/2020   Procedure: CYSTOSCOPY WITH STENT PLACEMENT;  Surgeon: Hollice Espy, MD;  Location: [facility] ORS;  Service: Urology;  Laterality: Left;  . ESOPHAGOGASTRODUODENOSCOPY (EGD) WITH PROPOFOL N/A 11/21/2019   Procedure: ESOPHAGOGASTRODUODENOSCOPY (EGD) WITH PROPOFOL;  Surgeon: Lin Landsman, MD;  Location: [facility] ENDOSCOPY;  Service: Gastroenterology;  Laterality: N/A;  . PARTIAL COLECTOMY N/A 01/30/2020   Procedure: PARTIAL COLECTOMY Sigmoid;  Surgeon: [name], MD;  Location: [place] ORS;  Service: General;  Laterality: N/A;  . PORTACATH PLACEMENT Left 03/28/2020   Procedure: INSERTION PORT-A-CATH;  Surgeon: [name], MD;  Location: [facility] ORS;  Service: General;  Laterality: Left;    Social History   Socioeconomic History  . Marital status: Single    Spouse name: Not on file  . Number of children: Not on file  . Years of education: Not on file  . Highest education level: Not on file  Occupational History  . Occupation: Factory  Tobacco Use  . Smoking status: Never Smoker  . Smokeless tobacco: Never Used  Vaping Use  . Vaping Use: Never used  Substance and Sexual Activity  . Alcohol use: Never  . Drug use: Never  . Sexual activity: Not on file  Other Topics Concern  . Not on file  Social History Narrative   Lives at home with significant other   Social Determinants of Health   Financial Resource Strain:   . Difficulty of Paying Living Expenses: Not on file  Food Insecurity:   . Worried About Charity fundraiser in the Last Year: Not on file  . Ran Out of Food in the Last Year: Not on file  Transportation Needs:   . Lack of Transportation (Medical): Not on file  . Lack of Transportation  (Non-Medical): Not on file  Physical Activity:   . Days of Exercise per Week: Not on file  . Minutes of Exercise per Session: Not on file  Stress:   . Feeling of Stress : Not on file  Social Connections:   . Frequency of Communication with Friends and Family: Not on file  . Frequency of Social Gatherings with Friends and Family: Not on file  . Attends Religious Services: Not on file  . Active Member of Clubs or Organizations: Not on file  . Attends Archivist Meetings: Not on file  . Marital Status: Not on file  Intimate Partner Violence:   . Fear of Current or Ex-Partner: Not on file  . Emotionally Abused: Not on file  . Physically Abused: Not on file  . Sexually Abused: Not on file     Family History  Problem Relation Age of Onset  . Diabetes Sister      Current Outpatient Medications:  .  acetaminophen (TYLENOL) 500 MG tablet, Take 500 mg by mouth every 6 (six) hours as needed for moderate pain., Disp: , Rfl:  .  amoxicillin-clavulanate (AUGMENTIN) 875-125 MG tablet, Take 1 tablet by mouth 2 (two) times daily for 10 days., Disp: 20 tablet, Rfl: 0 .  B-D 3CC LUER-LOK SYR 25GX1" 25G X 1" 3 ML MISC, Use with B12 injection, Disp: , Rfl:  .  cyanocobalamin (,VITAMIN B-12,) 1000 MCG/ML injection, Inject into the muscle. , Disp: , Rfl:  .  ibuprofen (ADVIL) 800 MG tablet, Take 1 tablet (800 mg total) by mouth every 8 (eight) hours as needed., Disp: 30 tablet, Rfl: 0 .  levothyroxine (SYNTHROID) 137 MCG tablet, Take 137 mcg by mouth daily before breakfast. , Disp: , Rfl:  .  polyethylene glycol (MIRALAX / GLYCOLAX) 17 g packet, Take 17 g by mouth daily. , Disp: , Rfl:  .  prochlorperazine (COMPAZINE) 10 MG tablet, Take 1 tablet (10 mg total) by mouth every 6 (six) hours as needed (Nausea or vomiting)., Disp: 30 tablet, Rfl: 1 .  docusate sodium (COLACE) 100 MG capsule, Take 1 capsule (100 mg total) by mouth daily., Disp: 30 capsule, Rfl: 1 .  ferrous sulfate 325 (65 FE) MG  tablet, Take 1 tablet (325 mg total) by mouth 2 (two) times daily with a meal. (Patient not taking: Reported on 05/18/2020), Disp: 60 tablet, Rfl: 3 .  lidocaine-prilocaine (EMLA) cream, Apply to affected area once (Patient not taking: Reported on 05/18/2020), Disp: 30 g, Rfl: 3 .  ondansetron (ZOFRAN) 8 MG tablet, Take 1 tablet (8 mg total) by mouth 2 (two) times daily as needed for refractory nausea / vomiting. Start on day 3 after chemotherapy. (Patient not taking: Reported on 05/18/2020), Disp: 30 tablet, Rfl: 1   Physical exam:  Vitals:   05/18/20 1317  BP: 126/83  Pulse: 65  Resp: 18  Temp: (!) 97 F (36.1 C)  Weight: 175 lb 8 oz (79.6 kg)   Physical Exam Constitutional:      General: She is not in acute distress.    Comments: Patient walks independently  HENT:     Head: Normocephalic and atraumatic.     Mouth/Throat:     Pharynx: No oropharyngeal exudate.  Eyes:     General: No scleral icterus.    Pupils: Pupils are equal, round, and reactive to light.  Cardiovascular:     Rate and Rhythm: Normal rate and regular rhythm.     Heart sounds: No murmur heard.   Pulmonary:     Effort: Pulmonary effort is normal. No respiratory distress.  Abdominal:     General: There is no distension.     Palpations: Abdomen is soft.     Tenderness: There is no abdominal tenderness.  Musculoskeletal:        General: Normal range of motion.     Cervical back: Normal range of motion and neck supple.  Skin:    General: Skin is warm and dry.     Findings: No erythema.  Neurological:     Mental Status: She is alert and oriented to person, place, and time.  Psychiatric:        Mood and Affect: Affect normal.        CMP Latest Ref Rng & Units 05/17/2020  Glucose 70 - 99 mg/dL 96  BUN 6 - 20 mg/dL 8  Creatinine 0.44 - 1.00 mg/dL 0.45  Sodium 135 - 145 mmol/L 139  Potassium 3.5 - 5.1 mmol/L 3.7  Chloride 98 - 111 mmol/L 103  CO2 22 - 32 mmol/L 26  Calcium 8.9 - 10.3 mg/dL 9.3  Total  Protein 6.5 - 8.1 g/dL 8.2(H)  Total Bilirubin 0.3 - 1.2 mg/dL 0.6  Alkaline Phos 38 - 126 U/L 92  AST 15 - 41 U/L 35  ALT 0 - 44 U/L 22   CBC Latest Ref Rng & Units 05/17/2020  WBC 4.0 - 10.5 K/uL 3.7(L)  Hemoglobin 12.0 - 15.0 g/dL 12.4  Hematocrit 36 - 46 %  37.8  Platelets 150 - 400 K/uL 150    RADIOGRAPHIC STUDIES: I have personally reviewed the radiological images as listed and agreed with the findings in the report. CT Abdomen Pelvis W Contrast  Result Date: 04/26/2020 CLINICAL DATA:  54 year old female with history of partial colectomy 3 months ago to remove a malignant mass. Copious wound drainage from midline abdominal wound. Evaluate for potential fistula or abscess/infection. EXAM: CT ABDOMEN AND PELVIS WITH CONTRAST TECHNIQUE: Multidetector CT imaging of the abdomen and pelvis was performed using the standard protocol following bolus administration of intravenous contrast. CONTRAST:  124m OMNIPAQUE IOHEXOL 300 MG/ML  SOLN COMPARISON:  CT the abdomen and pelvis 02/18/2020. FINDINGS: Lower chest: Linear scarring in the left lower lobe, similar to the prior study. Central venous catheter tip terminating in the distal superior vena cava. Hepatobiliary: No suspicious cystic or solid hepatic lesions. No intra or extrahepatic biliary ductal dilatation. Gallbladder is normal in appearance. Pancreas: No pancreatic mass. No pancreatic ductal dilatation. No pancreatic or peripancreatic fluid collections or inflammatory changes. Spleen: Unremarkable. Adrenals/Urinary Tract: Bilateral kidneys and adrenal glands are normal in appearance. No hydroureteronephrosis. Urinary bladder is normal in appearance. Stomach/Bowel: The appearance of the stomach is normal. There is no pathologic dilatation of small bowel or colon. Postoperative changes of left hemicolectomy are noted with anastomotic suture line in the mid pelvis between the mid transverse colon and sigmoid colon. Vascular/Lymphatic: No significant  atherosclerotic disease, aneurysm or dissection noted in the abdominal or pelvic vasculature. No lymphadenopathy noted in the abdomen or pelvis. Reproductive: Uterus and ovaries are unremarkable in appearance. Other: There is a complex abscess cavity in the lower anterior anatomic pelvis. The largest component of this is located on the left side of the pelvis (axial image 54 of series 2 and coronal image 36 of series 4) where there is a rim enhancing gas and fluid containing collection measuring 7.3 x 3.9 x 3.9 cm. This appears to communicate across the midline (best appreciated on axial image 62 of series 2 and coronal image 37 of series 4) to a smaller collection in the right lower quadrant which is located immediately inferior to the appendix and medial to the cecum (axial image 61 of series 2 and coronal image 44 of series 4) measuring 3.7 x 3.1 x 2.2 cm. This collection further appears to communicate anteriorly through to the skin surface via a fistula which is best appreciated on axial images 60-62 of series 2. Inflammatory changes are noted in the fat surrounding these collections and fistulous tracks in the low anatomic pelvis. In addition, in the subcutaneous fat of the anterior abdominal wall immediately superficial to the anterior abdominal wall musculature (axial image 50 of series 2 and sagittal image 83 of series 5) there is a 1.5 x 1.5 x 3.8 cm rim enhancing fluid collection which appears to communicate to the skin surface via fistulous tract. No significant volume of free flowing ascites. No pneumoperitoneum. Musculoskeletal: There are no aggressive appearing lytic or blastic lesions noted in the visualized portions of the skeleton. IMPRESSION: 1. Postoperative changes of recent left hemicolectomy with complex abscesses and fistulous tracts in the low anatomic pelvis extending to the skin surface, as above. In addition, there is an abscess in the subcutaneous fat of the lower anterior abdominal wall  which also communicates with the skin surface via a fistulous tract. 2. Additional incidental findings, as above. Electronically Signed   By: DVinnie LangtonM.D.   On: 04/26/2020 14:15   DG BE (  COLON)W SINGLE CM (SOL OR THIN BA)  Result Date: 05/01/2020 CLINICAL DATA:  Status post LEFT hemicolectomy. Rule out anastomotic leak. EXAM: SINGLE COLUMN BARIUM ENEMA TECHNIQUE: Initial scout AP supine abdominal image obtained to insure adequate colon cleansing. Barium was introduced into the colon in a retrograde fashion and refluxed from the rectum to the cecum. Spot images of the colon followed by overhead radiographs were obtained. FLUOROSCOPY TIME:  Fluoroscopy Time:  1.6 minutes. COMPARISON:  None. FINDINGS: Contrast moved promptly through the large bowel and into the distal small bowel without obstruction. No extraluminal contrast material was identified. IMPRESSION: No evidence of an anastomotic leak. No extraluminal contrast. No colonic obstruction. Electronically Signed   By: Franki Cabot M.D.   On: 05/01/2020 10:23   CT IMAGE GUIDED DRAINAGE BY PERCUTANEOUS CATHETER  Result Date: 04/27/2020 CLINICAL DATA:  Status post resection of sigmoid colonic carcinoma with development fistula to the level of a midline open wound and abscess in the left abdomen communicating with complex fistulous tracks by CT. EXAM: CT GUIDED CATHETER DRAINAGE OF LEFT LOWER QUADRANT PERITONEAL ABSCESS ANESTHESIA/SEDATION: 1.5 mg IV Versed 70 mcg IV Fentanyl Total Moderate Sedation Time:  20 minutes The patient's level of consciousness and physiologic status were continuously monitored during the procedure by Radiology nursing. PROCEDURE: The procedure, risks, benefits, and alternatives were explained to the patient. Questions regarding the procedure were encouraged and answered. The patient understands and consents to the procedure. A time out was performed prior to initiating the procedure. CT was performed through the lower  abdomen and pelvis in a supine position. The left lower abdominal wall was prepped with chlorhexidine in a sterile fashion, and a sterile drape was applied covering the operative field. A sterile gown and sterile gloves were used for the procedure. Local anesthesia was provided with 1% Lidocaine. An 18 gauge trocar needle was advanced to the level of a left lower quadrant abscess. After confirming needle tip position, a guidewire was advanced and the needle removed. The tract was dilated. A 10 French percutaneous drainage catheter was advanced over the wire and formed. Additional fluid sample was withdrawn and a sample sent for culture analysis. The catheter was flushed with saline and connected to a suction bulb. It was secured at the skin with a Prolene retention suture and StatLock device. COMPLICATIONS: None FINDINGS: The more focal abscess in the anterior left lower quadrant was targeted for drainage. This yielded bloody and purulent fluid. The drainage catheter was formed in the collection. IMPRESSION: CT-guided percutaneous catheter drainage of left lower quadrant abscess yielding bloody and purulent fluid. A sample was sent for culture analysis. A 10 French drain was placed and attached to suction bulb drainage. Electronically Signed   By: Aletta Edouard M.D.   On: 04/27/2020 13:35    Assessment and plan-  1. Malignant neoplasm of sigmoid colon ([place])   2. Iron deficiency anemia due to chronic blood loss   3. Intra-abdominal abscess ([place])   Cancer Staging Malignant neoplasm of sigmoid colon ([place]) Staging form: Colon and Rectum, AJCC 8th Edition - Clinical: No stage assigned - Unsigned - Pathologic: Stage IIIB (pT3, pN1c, cM0) - Signed by Earlie Server, MD on 02/15/2020  #Stage IIIb colon cancer- 01/30/2020 pT3 pN1c, MMR intact  Adjuvant chemotherapy was recommended.  This is unfortunately delayed due to the postsurgical complications. Awaiting CT scan and surgical clearance to proceed with  chemotherapy.  Dr. Celine Ahr plans to discontinue drainage catheter if her CT findings are improving. Patient will also  need to have Mediport placed. I have previously discussed rationale and potential risk of adjuvant chemotherapy.  Plan to use FOLFOX regimen.  #Iron deficiency anemia, hemoglobin has normalized to 12.4.  Oral iron supplementation also has improved.  I will hold off IV Venofer treatments for now.  #Constipation, recommend patient to use Colace 100 mg daily.  Use MiraLAX as needed.   Supportive care measures are necessary for patient well-being and will be provided as necessary. We spent sufficient time to discuss many aspect of care, questions were answered to patient's satisfaction.   Follow-up to be determined.  Awaiting surgical clearance to proceed with chemo. Earlie Server, MD, PhD Hematology Oncology [facility] Pager- [phone] 05/18/2020

## 2020-05-21 ENCOUNTER — Ambulatory Visit
Admission: RE | Admit: 2020-05-21 | Discharge: 2020-05-21 | Disposition: A | Discharge status: Home / Self Care | Payer: Self-pay | Source: Ambulatory Visit | Attending: General Surgery | Admitting: General Surgery

## 2020-05-21 ENCOUNTER — Other Ambulatory Visit: Payer: Self-pay

## 2020-05-21 DIAGNOSIS — T8143XA Infection following a procedure, organ and space surgical site, initial encounter: Secondary | ICD-10-CM | POA: Insufficient documentation

## 2020-05-21 MED ORDER — IOHEXOL 300 MG/ML  SOLN
100.0000 mL | Freq: Once | INTRAMUSCULAR | Status: AC | PRN
  Administered 2020-05-21: 100 mL via INTRAVENOUS

## 2020-05-23 ENCOUNTER — Telehealth: Payer: Self-pay | Admitting: Emergency Medicine

## 2020-05-23 NOTE — Telephone Encounter (Signed)
Daughter called stating pt has appt to see Dr Celine Ahr tomorrow afternoon. Daughter states she can not bring pt in because she works at [employer]. Asked if there was a morning appt with Dr Celine Ahr. Advised daughter there was not but I would contact Dr Celine Ahr. Per Dr Celine Ahr, pt can see Thedore Mins tomorrow at [place] time for interpreter services to come in at 10am instead of the afternoon. Called daughter back and notified. She Voiced understanding.

## 2020-05-24 ENCOUNTER — Encounter: Payer: Self-pay | Admitting: General Surgery

## 2020-05-24 ENCOUNTER — Other Ambulatory Visit: Payer: Self-pay

## 2020-05-24 ENCOUNTER — Encounter: Payer: Self-pay | Admitting: Physician Assistant

## 2020-05-24 ENCOUNTER — Ambulatory Visit (INDEPENDENT_AMBULATORY_CARE_PROVIDER_SITE_OTHER): Payer: Self-pay | Admitting: Physician Assistant

## 2020-05-24 VITALS — BP 110/63 | HR 56 | Temp 98.0°F | Ht 59.0 in | Wt 173.8 lb

## 2020-05-24 DIAGNOSIS — T8149XA Infection following a procedure, other surgical site, initial encounter: Secondary | ICD-10-CM

## 2020-05-24 DIAGNOSIS — K6389 Other specified diseases of intestine: Secondary | ICD-10-CM

## 2020-05-24 DIAGNOSIS — C187 Malignant neoplasm of sigmoid colon: Secondary | ICD-10-CM

## 2020-05-24 DIAGNOSIS — Z09 Encounter for follow-up examination after completed treatment for conditions other than malignant neoplasm: Secondary | ICD-10-CM

## 2020-05-24 DIAGNOSIS — T8143XA Infection following a procedure, organ and space surgical site, initial encounter: Secondary | ICD-10-CM

## 2020-05-24 NOTE — Patient Instructions (Signed)
Patient advised to leave the Bandage on for 2 days and then remove and apply band-aid to area. Patient advised to continue to pack the area where port was removed. Patient will follow up in 4 weeks with our office.  Cuidado de las heridas, en adultos Wound Care, Adult El cuidado correcto de las heridas puede ayudar a Information systems manager y a [place] infecciones y formacin de cicatrices. Adems, puede ayudar a que la cicatrizacin sea ms rpida. Cmo cuidar la herida Cuidados de la herida      Siga las instrucciones del mdico acerca del cuidado de la herida. Asegrese de hacer lo siguiente: ? Lvese las manos con agua y jabn antes de cambiar la venda (vendaje). Use desinfectante para manos si no dispone de [place] y [place]. ? Cambie el vendaje como se lo haya indicado el mdico. ? No retire los puntos (suturas), la goma para cerrar la piel o las tiras [place]. Es posible que estos cierres cutneos deban quedar puestos en la piel durante 2semanas o ms tiempo. Si los bordes de las tiras adhesivas empiezan a despegarse y Therapist, sports, puede recortar los que estn sueltos. No retire las tiras [facility] por completo a menos que el mdico se lo indique.  Controle la zona de la herida todos los das para detectar signos de infeccin. Est atento a los siguientes signos: ? Enrojecimiento, hinchazn o dolor. ? Lquido o sangre. ? Calor. ? Pus o mal olor.  Pregntele al mdico si debe limpiar la herida con agua y [place]. Hacer esto puede incluir lo siguiente: ? Usar una toalla limpia para secar la herida dando palmaditas despus de limpiarla. No se frote ni restriegue la herida. ? Aplicar una crema o un ungento. Hgalo nicamente como se lo haya indicado el mdico. ? Cubrir la incisin con un vendaje limpio.  Pregntele al mdico cundo puede dejar la herida al descubierto.  Mantenga el vendaje seco hasta que el mdico le diga que se lo puede quitar. No tome baos de inmersin, nade, use  el jacuzzi ni haga ninguna actividad en la que sumerja la herida en agua hasta que el mdico lo autorice. Pregntele al mdico si puede ducharse. Thurston Pounds solo le permitan darse baos de [place]. Medicamentos   Si le recetaron un antibitico, una crema o un ungento con antibitico, tmelo o aplqueselo como se lo haya indicado el mdico. No deje de tomar o de usar el antibitico, aunque la afeccin mejore.  Tome los medicamentos de venta libre y los recetados solamente como se lo haya indicado el mdico. Si le recetaron un analgsico, tmelo al menos 39minutos antes de Optometrist el cuidado de la herida, Civil Service fast streamer se lo haya indicado el mdico. Instrucciones generales  Reanude sus actividades normales segn lo indicado por el mdico. Pregntele al mdico qu actividades son seguras.  No se rasque ni se toque la herida.  No consuma ningn producto que contenga nicotina o tabaco, como cigarrillos y Psychologist, sport and exercise. El consumo de esos productos puede demorar la cicatrizacin de la herida. Si necesita ayuda para dejar de fumar, consulte al mdico.  Concurra a todas las visitas de seguimiento como se lo haya indicado el mdico. Esto es importante.  Siga una dieta que incluya protenas, vitaminas A y C y otros alimentos ricos en nutrientes que ayudarn a que la herida cicatrice. ? Los alimentos ricos en protenas incluyen carne, productos lcteos, frijoles y nueces, entre otras fuentes de protena. ? Los alimentos ricos en vitaminaA incluyen zanahorias y verduras  de hoja verde oscuro. ? Los alimentos ricos en vitaminaC incluyen ctricos, tomates y Herminie frutas y verduras. ? Los alimentos ricos en nutrientes tienen protenas, carbohidratos, grasas, vitaminas o minerales. Consuma una variedad de alimentos saludables, que incluya frutas, verduras y Psychologist, prison and probation services. Comunquese con un mdico si:  Le aplicaron la antitetnica y tiene hinchazn, dolor intenso, enrojecimiento o hemorragia en el  sitio de la inyeccin.  El dolor no se alivia con los [employer].  Tiene enrojecimiento, hinchazn o dolor alrededor de la herida.  Observa lquido o sangre que sale de la herida.  La herida se siente caliente al tacto.  Tiene pus o percibe mal olor que emana de la herida.  Tiene fiebre o siente escalofros.  Siente nuseas o vomita.  Tiene mareos. Solicite ayuda de inmediato si:  Tiene una lnea roja que sale de la herida.  Los bordes de la herida se abren y se separan.  La herida sangra y el sangrado no se detiene con una presin [place].  Tiene una erupcin cutnea.  Se desmaya.  Tiene dificultad para respirar. Resumen  Siempre lvese las manos con agua y jabn antes de cambiar la venda (vendaje).  Para ayudar con la cicatrizacin, coma alimentos ricos en protenas, vitaminas A y C y dems nutrientes.  Controle la herida [employer] para detectar signos de infeccin. Comunquese con el mdico si sospecha que la herida est infectada. Esta informacin no tiene Marine scientist el consejo del mdico. Asegrese de hacerle al mdico cualquier pregunta que tenga. Document Revised: 12/07/2017 Document Reviewed: 03/11/2016 Elsevier Patient Education  [employer].

## 2020-05-24 NOTE — Progress Notes (Signed)
[facility] POST-OP OFFICE VISIT  05/24/2020  HPI: Debra [name] is a 54 y.o. female ~3 monthss/p open left hemicolectomy for what proved to be colon cancer and ~1.5 month s/p port-a-cath placement with Dr Celine Ahr. She has had multiple issues with wound infections post-operatively, most recently needed port-removed on 09/09 secondary to wound dehiscence.  This morning, she is doing better.  She has abdominal pain at drain site.  No fever, chills, nausea, or emesis Continues wound packing to left chest wall wound from previous port site, no drainage or erythema.   She did have follow up CT Abdomen/Pelvis on 09/14 which showed resolution of previous LLQ fluid collection. Drain output has become more serous and decreased quantity.   Vital signs: BP 110/63   Pulse (!) 56   Temp 98 F (36.7 C) (Oral)   Ht 4\' 11"  (1.499 m)   Wt 173 lb 12.8 oz (78.8 kg)   SpO2 97%   BMI 35.10 kg/m    Physical Exam: Constitutional: Somewhat debilitated appearing female, although she appears more energetic than previous examinations, NAD Abdomen: Soft, tenderness only at drain site, non-distended, no rebound guarding. Drain in LLQ with serous output Skin: Laparotomy incision is healing well, there is an area near here umbilicus which is healing via secondary intention, this appears 100% granulation tissue and no longer with any openings nor derringer. No surrounding erythema. Left chest wall wound from previous port site healing via secondary intention, the wound bed is 100% granulation tissue, no erythema or drainage   Assessment/Plan: This is a 54 y.o. female ~3 monthss/p open left hemicolectomy for what proved to be colon cancer and ~1.5 month s/p port-a-cath placement, and her post-operative course has been complicated by multiple wound infections and delayed healing   - I was able to remove her drain at bedside today, covered with occlusive dressing  - Continue packing left  chest wall wound daily  - Superficial dressings to midline wound; this does appear to have closed now  - She will eventually need port-a-catheter placement in the future; she will follow up with Dr Celine Ahr in about ~1 month to reassess readiness for this. Daughter understands to call with questions/concerns in the interim.   Above history, examination, and discussion of the plan was obtained/verbalized with the help of interpretation from the patient's daughter at bedside. All questions and concerns appeared to be addressed and answered upon completion of visit.   -- [name], PA-C [facility] 05/24/2020, 10:26 AM [phone] M-F: 7am - 4pm

## 2020-05-29 ENCOUNTER — Telehealth: Payer: Self-pay

## 2020-05-29 NOTE — Telephone Encounter (Signed)
Patient's daughter called and states that the patient is reporting having fevers. She reports that she is still on antibiotics. She reports a fever of 101 on Sunday, 100 on Monday, and 101 just now. Patient to come in tomorrow morning to be seen by Otho Ket PA-C.

## 2020-05-30 ENCOUNTER — Encounter: Payer: Self-pay | Admitting: Physician Assistant

## 2020-05-30 ENCOUNTER — Other Ambulatory Visit: Payer: Self-pay

## 2020-05-30 ENCOUNTER — Ambulatory Visit (INDEPENDENT_AMBULATORY_CARE_PROVIDER_SITE_OTHER): Payer: Self-pay | Admitting: Physician Assistant

## 2020-05-30 ENCOUNTER — Other Ambulatory Visit
Admission: RE | Admit: 2020-05-30 | Discharge: 2020-05-30 | Disposition: A | Discharge status: Home / Self Care | Payer: Self-pay | Source: Ambulatory Visit | Attending: Physician Assistant | Admitting: Physician Assistant

## 2020-05-30 VITALS — BP 118/83 | HR 79 | Temp 98.1°F | Ht 59.0 in | Wt 171.8 lb

## 2020-05-30 DIAGNOSIS — T8143XA Infection following a procedure, organ and space surgical site, initial encounter: Secondary | ICD-10-CM

## 2020-05-30 DIAGNOSIS — R1032 Left lower quadrant pain: Secondary | ICD-10-CM | POA: Insufficient documentation

## 2020-05-30 DIAGNOSIS — Z09 Encounter for follow-up examination after completed treatment for conditions other than malignant neoplasm: Secondary | ICD-10-CM

## 2020-05-30 DIAGNOSIS — K651 Peritoneal abscess: Secondary | ICD-10-CM

## 2020-05-30 LAB — CBC WITH DIFFERENTIAL/PLATELET
Abs Immature Granulocytes: 0.01 10*3/uL (ref 0.00–0.07)
Basophils Absolute: 0 10*3/uL (ref 0.0–0.1)
Basophils Relative: 0 %
Eosinophils Absolute: 0.1 10*3/uL (ref 0.0–0.5)
Eosinophils Relative: 1 %
HCT: 32.4 % — ABNORMAL LOW (ref 36.0–46.0)
Hemoglobin: 10.8 g/dL — ABNORMAL LOW (ref 12.0–15.0)
Immature Granulocytes: 0 %
Lymphocytes Relative: 30 %
Lymphs Abs: 1.5 10*3/uL (ref 0.7–4.0)
MCH: 29.7 pg (ref 26.0–34.0)
MCHC: 33.3 g/dL (ref 30.0–36.0)
MCV: 89 fL (ref 80.0–100.0)
Monocytes Absolute: 0.5 10*3/uL (ref 0.1–1.0)
Monocytes Relative: 11 %
Neutro Abs: 2.8 10*3/uL (ref 1.7–7.7)
Neutrophils Relative %: 58 %
Platelets: 165 10*3/uL (ref 150–400)
RBC: 3.64 MIL/uL — ABNORMAL LOW (ref 3.87–5.11)
RDW: 12.6 % (ref 11.5–15.5)
WBC: 4.9 10*3/uL (ref 4.0–10.5)
nRBC: 0 % (ref 0.0–0.2)

## 2020-05-30 LAB — BASIC METABOLIC PANEL
Anion gap: 10 (ref 5–15)
BUN: 8 mg/dL (ref 6–20)
CO2: 24 mmol/L (ref 22–32)
Calcium: 9.2 mg/dL (ref 8.9–10.3)
Chloride: 100 mmol/L (ref 98–111)
Creatinine, Ser: 0.52 mg/dL (ref 0.44–1.00)
GFR calc Af Amer: >60 mL/min (ref >60)
GFR calc non Af Amer: >60 mL/min (ref >60)
Glucose, Bld: 114 mg/dL — ABNORMAL HIGH (ref 70–99)
Potassium: 3.8 mmol/L (ref 3.5–5.1)
Sodium: 134 mmol/L — ABNORMAL LOW (ref 135–145)

## 2020-05-30 LAB — URINALYSIS, ROUTINE W REFLEX MICROSCOPIC
Bilirubin Urine: NEGATIVE
Glucose, UA: NEGATIVE mg/dL
Hgb urine dipstick: NEGATIVE
Ketones, ur: NEGATIVE mg/dL
Leukocytes,Ua: NEGATIVE
Nitrite: NEGATIVE
Protein, ur: NEGATIVE mg/dL
Specific Gravity, Urine: 1.019 (ref 1.005–1.030)
pH: 5 (ref 5.0–8.0)

## 2020-05-30 NOTE — Patient Instructions (Signed)
We will have you do some labs today at [facility] entrance) we will also have them check your urine.   We may need to repeat imaging if your labs show that you have an infection. We will call you if that is needed.  Continue to monitor your temperature. May use Tylenol or Ibuprofen as needed.  Go to the ER if your fevers reach 103 or higher.   Otherwise you may follow up as scheduled with Dr Celine Ahr.

## 2020-05-30 NOTE — Progress Notes (Signed)
[facility] POST-OP OFFICE VISIT  05/30/2020  HPI: Debra [name] is a 54 y.o. female well known to our service following left hemicolectomy for what proved to be colon cancer in May and port placement in July, which was subsequently removed on 09/09 following wound dehiscence. Her post-operative course has unfortunately been complicated by multitude of infections and required percutaneous drainage on 08/20 (removed on 09/16).   History today obtained through help of medical interpretor, Kennyth Lose.   Today, she reports that she has been having intermittent fevers over the last 3 days. T-Max is 101F. She takes Motrin and this breaks the fever, but they have been recurring. She is afebrile in the office. She notes mild soreness from previous drain in her left abdomen but otherwise denied any pain. No cough, congestion, CP, SOB, nausea, emesis, bowel changes, or urinary changes. She continues with local wound care to the port site, which is healing via secondary intention. Midline laparotomy incision is now closed. No other issues.    Vital signs: BP 118/83   Pulse 79   Temp 98.1 F (36.7 C)   Ht 4\' 11"  (1.499 m)   Wt 171 lb 12.8 oz (77.9 kg)   SpO2 99%   BMI 34.70 kg/m    Physical Exam: Constitutional: Deconditioned appearing female, although she is sitting up and participates more in the examination, NAD Abdomen: Soft, I was unable to illicit and tenderness, non-distended, no rebound/guarding. No CVA tenderness Skin: Laparotomy incision is 100% closed and without drainage or erythema. Drain site in LLQ is healed, no erythema. Port site in left chest wall has closed some, now 1 x 3 cm, wound bed is 100% granulation tissue.    Assessment/Plan: This is a 54 y.o. female well known to our service following left hemicolectomy for what proved to be colon cancer in May and port placement in July, which was subsequently removed on 09/09 following wound dehiscence, and her  post-operative course has unfortunately been complicated by multitude of infections and required percutaneous drainage on 08/20 (removed on 09/16).   - I am unsure where her fever source may be, she has had issues with intermittent fevers at home in the past but has always been afebrile on examination in the clinic. These may be from atelectasis/immbolity. On her examination today, I am actually encouraged as her wounds have healed well without any evidence of infection, and she was without any tenderness. I am hesitant to preform any additional imaging as she has otherwise made great clinical improvement and my suspicion for intra-abdominal processes is low at this time. As a precaution, I will obtain CBC, BMP, and UA to look for leukocytosis, UTI, electrolyte derangements. If these are abnormal than we can consider repeat imaging studies. She is understanding and agreeable with this. I reviewed return precautions including, but not limited to, fever >102 (or fever that does not resolve with medication), worsening abdominal pain, nausea, or emesis. She understands that if these occur she may be best immediately evaluated by the emergency department. I will otherwise follow up her labs results and determine next steps of care if any. She is tentatively scheduled to follow up in 1 month with Dr Celine Ahr for port placement discussion pending her clinical condition.   -- [name], PA-C [facility] 05/30/2020, 11:36 AM [phone] M-F: 7am - 4pm

## 2020-06-07 ENCOUNTER — Ambulatory Visit
Admission: RE | Admit: 2020-06-07 | Discharge: 2020-06-07 | Disposition: A | Discharge status: Home / Self Care | Payer: Self-pay | Source: Ambulatory Visit | Attending: Physician Assistant | Admitting: Physician Assistant

## 2020-06-07 ENCOUNTER — Encounter: Payer: Self-pay | Admitting: Physician Assistant

## 2020-06-07 ENCOUNTER — Ambulatory Visit: Payer: Self-pay

## 2020-06-07 ENCOUNTER — Ambulatory Visit (INDEPENDENT_AMBULATORY_CARE_PROVIDER_SITE_OTHER): Payer: Self-pay | Admitting: Physician Assistant

## 2020-06-07 ENCOUNTER — Other Ambulatory Visit: Payer: Self-pay

## 2020-06-07 ENCOUNTER — Other Ambulatory Visit: Payer: Self-pay | Admitting: Physician Assistant

## 2020-06-07 VITALS — BP 128/83 | HR 97 | Temp 97.9°F | Resp 14 | Ht 60.0 in | Wt 169.0 lb

## 2020-06-07 DIAGNOSIS — T8143XA Infection following a procedure, organ and space surgical site, initial encounter: Secondary | ICD-10-CM

## 2020-06-07 DIAGNOSIS — Z09 Encounter for follow-up examination after completed treatment for conditions other than malignant neoplasm: Secondary | ICD-10-CM

## 2020-06-07 MED ORDER — IOHEXOL 300 MG/ML  SOLN
100.0000 mL | Freq: Once | INTRAMUSCULAR | Status: AC | PRN
  Administered 2020-06-07: 100 mL via INTRAVENOUS

## 2020-06-07 MED ORDER — AMOXICILLIN-POT CLAVULANATE 875-125 MG PO TABS: 1.0000 | ORAL_TABLET | Freq: Two times a day (BID) | ORAL | 0 refills | Status: AC

## 2020-06-07 NOTE — Progress Notes (Signed)
Personally reviewed patient's CT Abdomen/Pelvis results from 09/30. Which again shows intra-abdominal fluid collection and communication with patient's abdominal wound. Does not to appear to have any gross extravasation of contrast. Radiologist report reviewed below:   IMPRESSION: 1. Interval removal of the left abdominal percutaneous drain. Reaccumulation of a 6.2 x 2.7 cm pocket of rim enhancing fluid and gas at this location consistent with reaccumulation of abscess. 2. Fluid-filled track extends from the anterior left abdominal abscess across the midline down to the right lower quadrant where the 3.2 x 2.3 cm rim enhancing collection was identified on the previous study. The right lower quadrant collection appears relatively stable in the interval. 3. There is also a rim enhancing tract containing fluid extending from the right lower quadrant collection anteriorly in superiorly to the umbilical region where a 2 cm collection of soft tissue attenuation and gas is identified in and just superficial to the rectus sheath. 4. Wall thickening in the transverse colon just proximal to the anastomosis may be related to underdistention. Attention on follow-up recommended. 5. Similar appearance subsegmental atelectasis posterior left base.   I discussed the above with Dr Anselm Pancoast from IR. We will plan for IR drainage tomorrow. He recommended maintaining this drain for a minimal of 2 weeks and then contrast study the drain prior to removal.   I called the patient's daughter and updated her on the plan for drainage tomorrow. She was understanding and all questions answered.   Plan:   -- NPO at midnight -- IR drainage as outpatient tomorrow 10/1 -- Continue ABx; Augmentin BID x10 days -- Follow up in 1-2 weeks following drain placement  -- [name], PA-C [facility] 06/07/2020, 3:15 PM [phone] M-F: 7am - 4pm

## 2020-06-07 NOTE — Patient Instructions (Addendum)
We have ordered Stat CT Abdomen and Pelvis today.  Please go directly to [facility] entrance.  Please pick up your medication at the pharmacy today. Thedore Mins will call to discuss your CT results.

## 2020-06-07 NOTE — Progress Notes (Signed)
[facility] POST-OP OFFICE VISIT  06/07/2020  HPI: Debra [name] is a 54 y.o. female well known to our service following left hemicolectomy for what proved to be colon cancer in May and port placement in July, which was subsequently removed on 09/09 following wound dehiscence. Her post-operative course has unfortunately been complicated by multitude of infections and required percutaneous drainage on 08/20 (removed on 09/16).  History today obtained through help of medical interpretor, [name].  She unfortunately has continued to have temperatures almost daily at home. These are reportedly as high as 102F. No chills, nausea, emesis. Some abdominal discomfort but relatively unchanged from previous examinations. Additionally, she has reportedly started to drain "yellow-green" fluid from her midline abdominal wound which started again in the last 48 hours. She did have labs on 09/22 which were reassuring without leukocytosis.  Vital signs: BP 128/83   Pulse 97   Temp 97.9 F (36.6 C) (Oral)   Resp 14   Ht 5' (1.524 m)   Wt 169 lb (76.7 kg)   SpO2 99%   BMI 33.01 kg/m    Physical Exam: Constitutional: Deconditioned appearing female, NAD Abdomen: Soft, I was unable to illicit and tenderness, non-distended, no rebound/guarding.  Skin: She has started to drain thin appearing yellow-green fluid form her midline wound, this does not appear feculent nor enteric, the surrounding skin is mildly erythematous although I suspect this is irritation from drainage. Port site in left chest wall has closed some, now 0.5 x 1 cm, wound bed is 100% granulation tissue.    Assessment/Plan: This is a 54 y.o. female well known to our service following left hemicolectomy for what proved to be colon cancer in May and port placement in July, which was subsequently removed on 09/09 following wound dehiscence. Her post-operative course has unfortunately been complicated by multitude of infections  and required percutaneous drainage on 08/20 (removed on 09/16). Now again with drainage and fevers.    - She continues to have subjective fevers at home to 102F, although again she has been afebrile on presentation to our office as she has in the past. Interestingly, she has started to drain from her midline wound again which is concerning that she may have re-developed another intra-abdominal fluid collection or new abscess. Given this, I will obtain another STAT CT Abdomen/Pelvis to reassess for intra-abdominal process(es). I will also start her on Augmentin x10 days. We will follow up pending CT results. My worry is that she may need another drain.    -- [name], PA-C [facility] 06/07/2020, 10:12 AM [phone] M-F: 7am - 4pm

## 2020-06-08 ENCOUNTER — Ambulatory Visit
Admission: RE | Admit: 2020-06-08 | Discharge: 2020-06-08 | Disposition: A | Discharge status: Home / Self Care | Payer: Self-pay | Source: Ambulatory Visit | Attending: Physician Assistant | Admitting: Physician Assistant

## 2020-06-08 ENCOUNTER — Other Ambulatory Visit: Payer: Self-pay

## 2020-06-08 DIAGNOSIS — K651 Peritoneal abscess: Secondary | ICD-10-CM | POA: Insufficient documentation

## 2020-06-08 DIAGNOSIS — T8143XA Infection following a procedure, organ and space surgical site, initial encounter: Secondary | ICD-10-CM | POA: Insufficient documentation

## 2020-06-08 DIAGNOSIS — Z79890 Hormone replacement therapy (postmenopausal): Secondary | ICD-10-CM | POA: Insufficient documentation

## 2020-06-08 DIAGNOSIS — Z85038 Personal history of other malignant neoplasm of large intestine: Secondary | ICD-10-CM | POA: Insufficient documentation

## 2020-06-08 DIAGNOSIS — E039 Hypothyroidism, unspecified: Secondary | ICD-10-CM | POA: Insufficient documentation

## 2020-06-08 LAB — CBC
HCT: 32.5 % — ABNORMAL LOW (ref 36.0–46.0)
Hemoglobin: 10.9 g/dL — ABNORMAL LOW (ref 12.0–15.0)
MCH: 28.9 pg (ref 26.0–34.0)
MCHC: 33.5 g/dL (ref 30.0–36.0)
MCV: 86.2 fL (ref 80.0–100.0)
Platelets: 250 10*3/uL (ref 150–400)
RBC: 3.77 MIL/uL — ABNORMAL LOW (ref 3.87–5.11)
RDW: 11.9 % (ref 11.5–15.5)
WBC: 5.6 10*3/uL (ref 4.0–10.5)
nRBC: 0 % (ref 0.0–0.2)

## 2020-06-08 LAB — PROTIME-INR
INR: 1.1 (ref 0.8–1.2)
Prothrombin Time: 13.4 seconds (ref 11.4–15.2)

## 2020-06-08 MED ORDER — FENTANYL CITRATE (PF) 100 MCG/2ML IJ SOLN
INTRAMUSCULAR | Status: AC | PRN
  Administered 2020-06-08 (×2): 50 ug via INTRAVENOUS

## 2020-06-08 MED ORDER — MIDAZOLAM HCL 2 MG/2ML IJ SOLN
INTRAMUSCULAR | Status: AC
  Filled 2020-06-08: qty 2

## 2020-06-08 MED ORDER — MIDAZOLAM HCL 2 MG/2ML IJ SOLN
INTRAMUSCULAR | Status: AC | PRN
  Administered 2020-06-08: 1 mg via INTRAVENOUS

## 2020-06-08 MED ORDER — FENTANYL CITRATE (PF) 100 MCG/2ML IJ SOLN
INTRAMUSCULAR | Status: AC
  Filled 2020-06-08: qty 2

## 2020-06-08 MED ORDER — MIDAZOLAM HCL 5 MG/5ML IJ SOLN
INTRAMUSCULAR | Status: AC | PRN
  Administered 2020-06-08: 1 mg via INTRAVENOUS

## 2020-06-08 NOTE — Procedures (Signed)
Interventional Radiology Procedure Note  Procedure: CT Guided Drainage of LLQ abscess  Complications: None  Estimated Blood Loss: < 10 mL  Findings: 12 Fr drain placed in recurrent LLQ abscess with return of purulent fluid. Fluid sample sent for culture analysis. Drain attached to suction bulb drainage.  Venetia Night. Kathlene Cote, M.D Pager:  [phone]

## 2020-06-08 NOTE — Progress Notes (Signed)
Patient clinically stable post 12FR drain placement /abscess per DR Kathlene Cote, tolerated well. Received Versed 2 mg along with Fentanyl 100 mcg IV for procedure. Daughter at bedside post procedure with discharge instructions given along with supplies to start initial first week dressing changes. Vitals stable pre and post procedure. Denies complaints at this time.

## 2020-06-08 NOTE — H&P (Signed)
Chief Complaint: Patient was seen in consultation today for intra-abdominal fluid collection  Referring Physician(s): Schulz,Zachary R  Supervising Physician: Aletta Edouard  Patient Status: [facility] - Out-pt  History of Present Illness: Debra [name] is a 54 y.o. female with past medical history of left hemicolectomy for stage IIIB colon cancer 01/30/20.  Her recovery was complicated by several infection including infection of her midline wound and development of intra-abdominal fluid collections.  She underwent aspiration 6/30 and ultimate drain placement 04/27/20.  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  Of note, her Port also developed wound dehiscence.  She has been on Augmentin for the majority of past month due to her multiple wounds and reinfections. She has a current prescription for Augmentin which she plans to start after the procedure today.  Debra [name] presents to [facility] today for replacement of her RLQ drain. She reports subjective fevers at home with occasional abdominal pain, and describes that her abdomen feels "hard."   She is agreeable to procedure today.  Visit completed with assistance from medical interpreter.  Daughter also present at bedside. All questions answered.   Past Medical History:  Diagnosis Date  . Anemia   . Colon cancer ([place]) 04/2020  . Hypothyroidism   . Thyroid disease     Past Surgical History:  Procedure Laterality Date  . COLONOSCOPY WITH PROPOFOL N/A 11/21/2019   Procedure: COLONOSCOPY WITH PROPOFOL;  Surgeon: Lin Landsman, MD;  Location: [facility] ENDOSCOPY;  Service: Gastroenterology;  Laterality: N/A;  . CYSTOSCOPY WITH STENT PLACEMENT Left 01/30/2020   Procedure: CYSTOSCOPY WITH STENT PLACEMENT;  Surgeon: Hollice Espy, MD;  Location: [facility] ORS;  Service:  Urology;  Laterality: Left;  . ESOPHAGOGASTRODUODENOSCOPY (EGD) WITH PROPOFOL N/A 11/21/2019   Procedure: ESOPHAGOGASTRODUODENOSCOPY (EGD) WITH PROPOFOL;  Surgeon: Lin Landsman, MD;  Location: [facility] ENDOSCOPY;  Service: Gastroenterology;  Laterality: N/A;  . PARTIAL COLECTOMY N/A 01/30/2020   Procedure: PARTIAL COLECTOMY Sigmoid;  Surgeon: [name], MD;  Location: [facility] ORS;  Service: General;  Laterality: N/A;  . PORTACATH PLACEMENT Left 03/28/2020   Procedure: INSERTION PORT-A-CATH;  Surgeon: [name], MD;  Location: [facility] ORS;  Service: General;  Laterality: Left;    Allergies: Patient has no known allergies.  Medications: Prior to Admission medications   Medication Sig Start Date End Date Taking? Authorizing Provider  acetaminophen (TYLENOL) 500 MG tablet Take 500 mg by mouth every 6 (six) hours as needed for moderate pain.   Yes [provider]  amoxicillin-clavulanate (AUGMENTIN) 875-125 MG tablet Take 1 tablet by mouth 2 (two) times daily for 10 days. 06/07/20 06/17/20 Yes [name] R, PA-C  B-D 3CC LUER-LOK SYR 25GX1" 25G X 1" 3 ML MISC Use with B12 injection 01/10/20  Yes [provider]  cyanocobalamin (,VITAMIN B-12,) 1000 MCG/ML injection Inject into the muscle.  01/10/20  Yes [provider]  docusate sodium (COLACE) 100 MG capsule Take 1 capsule (100 mg total) by mouth daily. 05/18/20  Yes Earlie Server, MD  ferrous sulfate 325 (65 FE) MG tablet Take 1 tablet (325 mg total) by mouth 2 (two) times daily with a meal. 11/22/19  Yes Fritzi Mandes, MD  ibuprofen (ADVIL) 800 MG tablet Take 1 tablet (800 mg total) by mouth every 8 (eight) hours as needed. 02/14/20  Yes Tylene Fantasia, PA-C  levothyroxine (SYNTHROID) 137 MCG tablet Take 137 mcg  by mouth daily before breakfast.    Yes [provider]  lidocaine-prilocaine (EMLA) cream Apply to affected area once 03/08/20  Yes Earlie Server, MD  ondansetron (ZOFRAN) 8 MG tablet Take 1 tablet (8 mg  total) by mouth 2 (two) times daily as needed for refractory nausea / vomiting. Start on day 3 after chemotherapy. 03/08/20  Yes Earlie Server, MD  polyethylene glycol (MIRALAX / GLYCOLAX) 17 g packet Take 17 g by mouth daily.    Yes [provider]  prochlorperazine (COMPAZINE) 10 MG tablet Take 1 tablet (10 mg total) by mouth every 6 (six) hours as needed (Nausea or vomiting). 03/08/20  Yes Earlie Server, MD     Family History  Problem Relation Age of Onset  . Diabetes Sister     Social History   Socioeconomic History  . Marital status: Single    Spouse name: Not on file  . Number of children: Not on file  . Years of education: Not on file  . Highest education level: Not on file  Occupational History  . Occupation: Factory  Tobacco Use  . Smoking status: Never Smoker  . Smokeless tobacco: Never Used  Vaping Use  . Vaping Use: Never used  Substance and Sexual Activity  . Alcohol use: Never  . Drug use: Never  . Sexual activity: Not on file  Other Topics Concern  . Not on file  Social History Narrative   Lives at home with significant other   Social Determinants of Health   Financial Resource Strain:   . Difficulty of Paying Living Expenses: Not on file  Food Insecurity:   . Worried About Charity fundraiser in the Last Year: Not on file  . Ran Out of Food in the Last Year: Not on file  Transportation Needs:   . Lack of Transportation (Medical): Not on file  . Lack of Transportation (Non-Medical): Not on file  Physical Activity:   . Days of Exercise per Week: Not on file  . Minutes of Exercise per Session: Not on file  Stress:   . Feeling of Stress : Not on file  Social Connections:   . Frequency of Communication with Friends and Family: Not on file  . Frequency of Social Gatherings with Friends and Family: Not on file  . Attends Religious Services: Not on file  . Active Member of Clubs or Organizations: Not on file  . Attends Archivist Meetings: Not on  file  . Marital Status: Not on file     Review of Systems: A 12 point ROS discussed and pertinent positives are indicated in the HPI above.  All other systems are negative.  Review of Systems  Constitutional: Positive for fever. Negative for fatigue.  Respiratory: Negative for cough and shortness of breath.   Cardiovascular: Negative for chest pain.  Gastrointestinal: Positive for abdominal pain. Negative for constipation, diarrhea and nausea.  Genitourinary: Negative for dysuria.  Musculoskeletal: Negative for back pain.  Psychiatric/Behavioral: Negative for behavioral problems and confusion.    Vital Signs: BP 125/72 (BP Location: Right Arm)   Pulse 72   Temp 98.2 F (36.8 C) (Oral)   Resp 20   Ht 4\' 11"  (1.499 m)   Wt 169 lb (76.7 kg)   SpO2 100%   BMI 34.13 kg/m   Physical Exam Vitals and nursing note reviewed.  Constitutional:      Appearance: Normal appearance.  HENT:     Mouth/Throat:     Mouth: Mucous membranes  are moist.     Pharynx: Oropharynx is clear.  Cardiovascular:     Rate and Rhythm: Normal rate and regular rhythm.  Pulmonary:     Effort: Pulmonary effort is normal. No respiratory distress.     Breath sounds: Normal breath sounds.  Abdominal:     General: There is no distension.     Palpations: Abdomen is soft.  Skin:    General: Skin is warm and dry.  Neurological:     General: No focal deficit present.     Mental Status: She is alert and oriented to person, place, and time. Mental status is at baseline.  Psychiatric:        Mood and Affect: Mood normal.        Behavior: Behavior normal.        Thought Content: Thought content normal.        Judgment: Judgment normal.      MD Evaluation Airway: WNL Heart: WNL Abdomen: WNL Chest/ Lungs: WNL ASA  Classification: 3 Mallampati/Airway Score: One   Imaging: CT Abdomen Pelvis W Contrast  Result Date: 06/07/2020 CLINICAL DATA:  Left hemicolectomy.  History of abscess. EXAM: CT ABDOMEN  AND PELVIS WITH CONTRAST TECHNIQUE: Multidetector CT imaging of the abdomen and pelvis was performed using the standard protocol following bolus administration of intravenous contrast. CONTRAST:  13mL OMNIPAQUE IOHEXOL 300 MG/ML  SOLN COMPARISON:  05/21/2020 FINDINGS: Lower chest: Subsegmental atelectasis again noted left lower lobe, similar to prior. Hepatobiliary: No suspicious focal abnormality within the liver parenchyma. There is no evidence for gallstones, gallbladder wall thickening, or pericholecystic fluid. No intrahepatic or extrahepatic biliary dilation. Pancreas: No focal mass lesion. No dilatation of the main duct. No intraparenchymal cyst. No peripancreatic edema. Spleen: No splenomegaly. No focal mass lesion. Adrenals/Urinary Tract: No adrenal nodule or mass. Mild fullness noted right intrarenal collecting system. Kidneys otherwise unremarkable. Left ureter normal. Mild distention of the right ureter evident. The urinary bladder appears normal for the degree of distention. Stomach/Bowel: Stomach is unremarkable. No gastric wall thickening. No evidence of outlet obstruction. Duodenum is normally positioned as is the ligament of Treitz. No small bowel wall thickening. No small bowel dilatation. The terminal ileum is normal. Cecal tip appears tethered into the right lower quadrant/adnexal region, extending down to the location of the 3.2 x 2.3 cm fluid collection identified on the previous study. Right lower quadrant collection appears relatively in the interval although the track between this right lower quadrant collection and the anterior left collection shows more fluid than on the previous study as it tracks over to the left anterior abdominal collection. A second track extends from the right lower quadrant collection anteriorly to the umbilical region (well demonstrated on sagittal 62/6) where a 1.8 x 2.3 cm collection of soft tissue attenuation and gas bubbles is noted in the rectus sheath and  subcutaneous fat superficial to the rectus sheath (axial 55/2). The left anterior collection is at the site of the percutaneous drainage catheter seen on the previous exam and has increased in the interval measuring 6.2 x 2.9 cm today and containing gas and fluid. Transverse colon proximal to the anastomosis shows wall thickening but is nondistended which likely accentuates this appearance. Vascular/Lymphatic: No abdominal aortic aneurysm. No abdominal aortic atherosclerotic calcification. There is no gastrohepatic or hepatoduodenal ligament lymphadenopathy. No retroperitoneal or mesenteric lymphadenopathy. No pelvic sidewall lymphadenopathy. Reproductive: The uterus is unremarkable.  There is no adnexal mass. Other: No substantial intraperitoneal free fluid. Musculoskeletal: No worrisome lytic or sclerotic  osseous abnormality. IMPRESSION: 1. Interval removal of the left abdominal percutaneous drain. Reaccumulation of a 6.2 x 2.7 cm pocket of rim enhancing fluid and gas at this location consistent with reaccumulation of abscess. 2. Fluid-filled track extends from the anterior left abdominal abscess across the midline down to the right lower quadrant where the 3.2 x 2.3 cm rim enhancing collection was identified on the previous study. The right lower quadrant collection appears relatively stable in the interval. 3. There is also a rim enhancing tract containing fluid extending from the right lower quadrant collection anteriorly in superiorly to the umbilical region where a 2 cm collection of soft tissue attenuation and gas is identified in and just superficial to the rectus sheath. 4. Wall thickening in the transverse colon just proximal to the anastomosis may be related to underdistention. Attention on follow-up recommended. 5. Similar appearance subsegmental atelectasis posterior left base. Electronically Signed   By: Misty Stanley M.D.   On: 06/07/2020 14:35   CT Abdomen Pelvis W Contrast  Result Date:  05/22/2020 CLINICAL DATA:  Lower abdominal pain. Follow-up abscess. Recent diagnosis of colon cancer. EXAM: CT ABDOMEN AND PELVIS WITH CONTRAST TECHNIQUE: Multidetector CT imaging of the abdomen and pelvis was performed using the standard protocol following bolus administration of intravenous contrast. CONTRAST:  160mL OMNIPAQUE IOHEXOL 300 MG/ML  SOLN COMPARISON:  04/26/2020 FINDINGS: Lower chest: Left base atelectasis.  Heart is normal size. Hepatobiliary: No focal hepatic abnormality. Gallbladder unremarkable. Pancreas: No focal abnormality or ductal dilatation. Spleen: No focal abnormality.  Normal size. Adrenals/Urinary Tract: No adrenal abnormality. No focal renal abnormality. No stones or hydronephrosis. Urinary bladder is unremarkable. Stomach/Bowel: Prior partial colectomy. No evidence of bowel obstruction. Stomach and small bowel grossly unremarkable. Vascular/Lymphatic: No evidence of aneurysm or adenopathy. Reproductive: Uterus and adnexa unremarkable.  No mass. Other: Left lower quadrant drainage catheter in place in the region of previously seen left lower quadrant fluid collection. No residual fluid noted around the drainage catheter. A tract coursing over to the right anterior pelvic fluid collection at appears to remain present with small fluid collection on the right now measuring approximately 3.2 X 2.3 cm compared to 3.7 x 3.1 cm previously. Fistulous tract again seen extending to the anterior abdominal wall. No drainable fluid seen within the anterior abdominal wall fistulous track. No free fluid or free air. Musculoskeletal: No acute bony abnormality. IMPRESSION: Resolution of the previously seen left lower quadrant fluid collection with drainage catheter in place. There does appear to continue to be a fistulous tract extending from this area across the midline into the right pelvis where continued small fluid collection is seen, decreased slightly in size since prior study. Fistulous tract  also continued to extending anteriorly from this right pelvic fluid collection to the anterior abdominal wall without drainable fluid collection within the abdominal wall fistula. Postoperative changes from partial colectomy.  No bowel obstruction. Left base atelectasis, stable. Electronically Signed   By: Rolm Baptise M.D.   On: 05/22/2020 11:27    Labs:  CBC: Recent Labs    04/27/20 0841 05/17/20 1052 05/30/20 1215 06/08/20 1323  WBC 4.7 3.7* 4.9 5.6  HGB 10.4* 12.4 10.8* 10.9*  HCT 32.2* 37.8 32.4* 32.5*  PLT 195 150 165 250    COAGS: Recent Labs    11/20/19 0941 04/27/20 0841 06/08/20 1323  INR 1.2 1.1 1.1  APTT 33  --   --     BMP: Recent Labs    03/23/20 1108 04/26/20 1155 05/17/20 1052  05/30/20 1215  NA 130* 135 139 134*  K 3.6 4.0 3.7 3.8  CL 98 101 103 100  CO2 22 27 26 24   GLUCOSE 100* 113* 96 114*  BUN 6 7 8 8   CALCIUM 8.1* 8.8* 9.3 9.2  CREATININE 0.47 0.45 0.45 0.52  GFRNONAA >60 >60 >60 >60  GFRAA >60 >60 >60 >60    LIVER FUNCTION TESTS: Recent Labs    02/13/20 0822 02/17/20 1955 03/23/20 1108 05/17/20 1052  BILITOT 0.2* 0.5 0.7 0.6  AST 21 51* 19 35  ALT 12 30 14 22   ALKPHOS 93 96 63 92  PROT 5.7* 6.8 6.8 8.2*  ALBUMIN 2.0* 2.4* 2.7* 4.0    TUMOR MARKERS: No results for input(s): AFPTM, CEA, CA199, CHROMGRNA in the last 8760 hours.  Assessment and Plan: Patient with past medical history of stage IIIB colon cancer s/p hemicolectomy with intra-abdominal abscess requiring previous presents with complaint of recurrent abscess. IR consulted for replacement of intra-abdominal drain at the request of Otho Ket, PA-C. Case reviewed by Dr. Kathlene Cote who approves patient for procedure.  Patient presents today in their usual state of health.  She has been NPO and is not currently on blood thinners.   Discussed results of her imaging and implications for drain replacement.   She understands drain is to be placed today and may remain for  several weeks at it did previously.   Risks and benefits were discussed with the patient and/or patient's family including, but not limited to bleeding, infection, damage to adjacent structures or low yield requiring additional tests.  All of the questions were answered and there is agreement to proceed.  Consent signed and in chart.  Thank you for this interesting consult.  I greatly enjoyed meeting Debra [name] and look forward to participating in their care.  A copy of this report was sent to the requesting provider on this date.  Electronically Signed: Docia Barrier, PA 06/08/2020, 2:16 PM   I spent a total of  30 Minutes   in face to face in clinical consultation, greater than 50% of which was counseling/coordinating care for intra-abdominal fluid collection.

## 2020-06-08 NOTE — Discharge Instructions (Signed)
Percutaneous Abscess Drain, Care After This sheet gives you information about how to care for yourself after your procedure. Your health care provider may also give you more specific instructions. If you have problems or questions, contact your health care provider. What can I expect after the procedure? After your procedure, it is common to have:  A small amount of bruising and discomfort in the area where the drainage tube (catheter) was placed.  Sleepiness and fatigue. This should go away after the medicines you were given have worn off. Follow these instructions at home: Incision care  Follow instructions from your health care provider about how to take care of your incision. Make sure you: ? Wash your hands with soap and water before you change your bandage (dressing). If soap and water are not available, use hand sanitizer. ? Change your dressing as told by your health care provider. ? Leave stitches (sutures), skin glue, or adhesive strips in place. These skin closures may need to stay in place for 2 weeks or longer. If adhesive strip edges start to loosen and curl up, you may trim the loose edges. Do not remove adhesive strips completely unless your health care provider tells you to do that.  Check your incision area every day for signs of infection. Check for: ? More redness, swelling, or pain. ? More fluid or blood. ? Warmth. ? Pus or a bad smell. ? Fluid leaking from around your catheter (instead of fluid draining through your catheter). Catheter care   Follow instructions from your health care provider about emptying and cleaning your catheter and collection bag. You may need to clean the catheter every day so it does not clog.  If directed, write down the following information every time you empty your bag: ? The date and time. ? The amount of drainage. General instructions  Rest at home for 1-2 days after your procedure. Return to your normal activities as told by your  health care provider.  Do not take baths, swim, or use a hot tub for 24 hours after your procedure, or until your health care provider says that this is okay.  Take over-the-counter and prescription medicines only as told by your health care provider.  Keep all follow-up visits as told by your health care provider. This is important. Contact a health care provider if:  You have less than 10 mL of drainage a day for 2-3 days in a row, or as directed by your health care provider.  You have more redness, swelling, or pain around your incision area.  You have more fluid or blood coming from your incision area.  Your incision area feels warm to the touch.  You have pus or a bad smell coming from your incision area.  You have fluid leaking from around your catheter (instead of through your catheter).  You have a fever or chills.  You have pain that does not get better with medicine. Get help right away if:  Your catheter comes out.  You suddenly stop having drainage from your catheter.  You suddenly have blood in the fluid that is draining from your catheter.  You become dizzy or you faint.  You develop a rash.  You have nausea or vomiting.  You have difficulty breathing or you feel short of breath.  You develop chest pain.  You have problems with your speech or vision.  You have trouble balancing or moving your arms or legs. Summary  It is common to have a small   amount of bruising and discomfort in the area where the drainage tube (catheter) was placed.  You may be directed to record the amount of drainage from the bag every time you empty it.  Follow instructions from your health care provider about emptying and cleaning your catheter and collection bag. This information is not intended to replace advice given to you by your health care provider. Make sure you discuss any questions you have with your health care provider. Document Revised: 08/07/2017 Document  Reviewed: 07/17/2016 Elsevier Patient Education  2020 [employer].

## 2020-06-11 LAB — AEROBIC/ANAEROBIC CULTURE W GRAM STAIN (SURGICAL/DEEP WOUND)

## 2020-06-14 ENCOUNTER — Other Ambulatory Visit: Payer: Self-pay

## 2020-06-14 ENCOUNTER — Inpatient Hospital Stay: Payer: Self-pay | Attending: Oncology

## 2020-06-14 NOTE — Progress Notes (Signed)
Nutrition Follow-up:  Patient with colon cancer, s/p sigmoid colectomy with primary anastomosis with post op complications of ileus, on TPN, wound infection.  Planning to start chemotherapy after wound heals.  Met with patient and daughter today in clinic.  Interpreter Hart Carwin [phone] via Bethanie Dicker assisted with interpretation during visit.  Patient reports decreased appetite for about 2 weeks due to fever, not feeling well.  Recently had drain placed in recurrent LLQ abscess.  Reports that few days after drain was placed appetite improved.  Has been eating cereal and milk, yogurt, tortillas, chicken, cheese, beans, rice.  Drinks ensure/boost but not daily. Also drinks water and juice.  Reports some constipation since starting antibiotics.  Has been taking medications to help with constipation.  Last bowel movement this morning at 7am.      Medications: reviewed  Labs: no new  Anthropometrics:   Weight today in clinic 172 lb 6 oz, stable from 173 lb 8 oz on 9/9  Noted weight of 169 lb on 10/1   NUTRITION DIAGNOSIS: Inadequate oral intake improving   INTERVENTION:  Reviewed importance of protein foods for healing.   Recommend oral nutrition supplement 1-2 times per day. Coupons given Discussed strategies to help with constipation.   Contact information given to patient    MONITORING, EVALUATION, GOAL: weight trends, intake   NEXT VISIT: as needed with treatment  Aarianna Hoadley B. Zenia Resides, [place], [place] Registered Dietitian [phone] (mobile)

## 2020-06-18 ENCOUNTER — Telehealth: Payer: Self-pay | Admitting: *Deleted

## 2020-06-18 MED ORDER — SODIUM CHLORIDE 0.9 % IJ SOLN: 10.0000 mL | INTRAMUSCULAR | 5 refills | Status: DC | PRN

## 2020-06-18 MED ORDER — SODIUM CHLORIDE 0.9 % IJ SOLN: INTRAMUSCULAR | 5 refills | Status: DC

## 2020-06-18 NOTE — Telephone Encounter (Signed)
Spoke with one of patient's daughter Thursday about needing extra prefilled syringes to flush patient's drain and notified patient's daughter I would place them up front with our front desk staff. Attempted to contact patient's daughter unable to leave a voice message on Friday before clinic closed at [place] with patient daughter Debra [name] and informed her there was some prefilled syringes up front with the front desk staff for mother, but we have also sent in a prescription for pre-filled syringes to [facility]. Patient's daughter verbalized understanding and has no further questions.

## 2020-06-18 NOTE — Telephone Encounter (Signed)
Patients daughter called and stated that her mother needs some more flush to flush her drain out. She has been out for a couple of days now.

## 2020-06-21 ENCOUNTER — Ambulatory Visit: Payer: Self-pay | Admitting: General Surgery

## 2020-06-25 ENCOUNTER — Other Ambulatory Visit: Payer: Self-pay | Admitting: General Surgery

## 2020-06-25 DIAGNOSIS — K651 Peritoneal abscess: Secondary | ICD-10-CM

## 2020-07-03 ENCOUNTER — Ambulatory Visit
Admission: RE | Admit: 2020-07-03 | Discharge: 2020-07-03 | Disposition: A | Discharge status: Home / Self Care | Payer: Self-pay | Source: Ambulatory Visit | Attending: General Surgery | Admitting: General Surgery

## 2020-07-03 DIAGNOSIS — K651 Peritoneal abscess: Secondary | ICD-10-CM

## 2020-07-03 HISTORY — PX: IR RADIOLOGIST EVAL & MGMT: IMG5224

## 2020-07-03 MED ORDER — IOPAMIDOL (ISOVUE-300) INJECTION 61%
100.0000 mL | Freq: Once | INTRAVENOUS | Status: AC | PRN
  Administered 2020-07-03: 100 mL via INTRAVENOUS

## 2020-07-03 NOTE — Progress Notes (Signed)
Chief Complaint: Patient was seen in consultation today for LLQ abscess at the request of [place]  Referring Physician(s): Cannon,Jennifer  History of Present Illness: Debra [name] is a 54 y.o. 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 This is her second round with a drain. She is feeling good. Hasn't seen surgery team yet since discharge. She reports small amount of drainage in bulb, still flushing once daily. Has noticed some drainage at the skin site when flushing but not every time. Otherwise, feels good.  Daughter at bedside. She is here for follow up CT and drain injection today  Past Medical History:  Diagnosis Date  . Anemia   . Colon cancer ([place]) 04/2020  . Hypothyroidism   . Thyroid disease     Past Surgical History:  Procedure Laterality Date  . COLONOSCOPY WITH PROPOFOL N/A 11/21/2019   Procedure: COLONOSCOPY WITH PROPOFOL;  Surgeon: Lin Landsman, MD;  Location: [facility] ENDOSCOPY;  Service: Gastroenterology;  Laterality: N/A;  . CYSTOSCOPY WITH STENT PLACEMENT Left 01/30/2020   Procedure: CYSTOSCOPY WITH STENT PLACEMENT;  Surgeon: Hollice Espy, MD;  Location: [facility] ORS;  Service: Urology;  Laterality: Left;  . ESOPHAGOGASTRODUODENOSCOPY (EGD) WITH PROPOFOL N/A 11/21/2019   Procedure: ESOPHAGOGASTRODUODENOSCOPY (EGD) WITH PROPOFOL;  Surgeon: Lin Landsman, MD;  Location: [facility] ENDOSCOPY;  Service: Gastroenterology;  Laterality: N/A;  . IR RADIOLOGIST EVAL & MGMT  07/03/2020  . PARTIAL COLECTOMY N/A 01/30/2020   Procedure: PARTIAL COLECTOMY Sigmoid;  Surgeon: [name], MD;  Location: [facility] ORS;  Service: General;  Laterality: N/A;  . PORTACATH PLACEMENT Left 03/28/2020   Procedure: INSERTION PORT-A-CATH;  Surgeon: [name], MD;  Location: [facility] ORS;  Service: General;  Laterality: Left;    Allergies: Patient has no known allergies.  Medications: Prior to Admission medications   Medication Sig Start Date  End Date Taking? Authorizing Provider  acetaminophen (TYLENOL) 500 MG tablet Take 500 mg by mouth every 6 (six) hours as needed for moderate pain.    [provider]  B-D 3CC LUER-LOK SYR 25GX1" 25G X 1" 3 ML MISC Use with B12 injection 01/10/20   [provider]  cyanocobalamin (,VITAMIN B-12,) 1000 MCG/ML injection Inject into the muscle.  01/10/20   [provider]  docusate sodium (COLACE) 100 MG capsule Take 1 capsule (100 mg total) by mouth daily. 05/18/20   Earlie Server, MD  ferrous sulfate 325 (65 FE) MG tablet Take 1 tablet (325 mg total) by mouth 2 (two) times daily with a meal. 11/22/19   Fritzi Mandes, MD  ibuprofen (ADVIL) 800 MG tablet Take 1 tablet (800 mg total) by mouth every 8 (eight) hours as needed. 02/14/20   Tylene Fantasia, PA-C  levothyroxine (SYNTHROID) 137 MCG tablet Take 137 mcg by mouth daily before breakfast.     [provider]  lidocaine-prilocaine (EMLA) cream Apply to affected area once 03/08/20   Earlie Server, MD  ondansetron (ZOFRAN) 8 MG tablet Take 1 tablet (8 mg total) by mouth 2 (two) times daily as needed for refractory nausea / vomiting. Start on day 3 after chemotherapy. 03/08/20   Earlie Server, MD  polyethylene glycol (MIRALAX / GLYCOLAX) 17 g packet Take 17 g by mouth daily.     [provider]  prochlorperazine (COMPAZINE) 10 MG tablet Take 1 tablet (10 mg total) by mouth every 6 (six) hours as needed (Nausea or vomiting). 03/08/20   Earlie Server, MD  sodium chloride 0.9 % injection Use 1 prefilled syringe (  58mL) to flush drain. 06/18/20   Tylene Fantasia, PA-C     Family History  Problem Relation Age of Onset  . Diabetes Sister     Social History   Socioeconomic History  . Marital status: Single    Spouse name: Not on file  . Number of children: Not on file  . Years of education: Not on file  . Highest education level: Not on file  Occupational History  . Occupation: Factory  Tobacco Use  . Smoking status: Never Smoker  .  Smokeless tobacco: Never Used  Vaping Use  . Vaping Use: Never used  Substance and Sexual Activity  . Alcohol use: Never  . Drug use: Never  . Sexual activity: Not on file  Other Topics Concern  . Not on file  Social History Narrative   Lives at home with significant other   Social Determinants of Health   Financial Resource Strain:   . Difficulty of Paying Living Expenses: Not on file  Food Insecurity:   . Worried About Charity fundraiser in the Last Year: Not on file  . Ran Out of Food in the Last Year: Not on file  Transportation Needs:   . Lack of Transportation (Medical): Not on file  . Lack of Transportation (Non-Medical): Not on file  Physical Activity:   . Days of Exercise per Week: Not on file  . Minutes of Exercise per Session: Not on file  Stress:   . Feeling of Stress : Not on file  Social Connections:   . Frequency of Communication with Friends and Family: Not on file  . Frequency of Social Gatherings with Friends and Family: Not on file  . Attends Religious Services: Not on file  . Active Member of Clubs or Organizations: Not on file  . Attends Archivist Meetings: Not on file  . Marital Status: Not on file    Review of Systems: A 12 point ROS discussed and pertinent positives are indicated in the HPI above.  All other systems are negative.  Review of Systems  Vital Signs: BP 124/72   Pulse 60   Temp 98.5 F (36.9 C)   SpO2 100%   Physical Exam Constitutional:      Appearance: Normal appearance.  Cardiovascular:     Rate and Rhythm: Normal rate and regular rhythm.     Heart sounds: Normal heart sounds.  Pulmonary:     Effort: Pulmonary effort is normal. No respiratory distress.  Abdominal:     Palpations: Abdomen is soft.     Comments: LLQ dain intact, site clean, NT Output in bulb serosanguinous, though some greenish staining in bulb as well.  Neurological:     General: No focal deficit present.     Mental Status: She is alert and  oriented to person, place, and time.      Imaging: CT- essentially resolved LLQ abscess, see full report Drain injection- no definitive fistulous communication to bowel   Labs:  CBC: Recent Labs    04/27/20 0841 05/17/20 1052 05/30/20 1215 06/08/20 1323  WBC 4.7 3.7* 4.9 5.6  HGB 10.4* 12.4 10.8* 10.9*  HCT 32.2* 37.8 32.4* 32.5*  PLT 195 150 165 250    COAGS: Recent Labs    11/20/19 0941 04/27/20 0841 06/08/20 1323  INR 1.2 1.1 1.1  APTT 33  --   --     BMP: Recent Labs    03/23/20 1108 04/26/20 1155 05/17/20 1052 05/30/20 1215  NA 130*  135 139 134*  K 3.6 4.0 3.7 3.8  CL 98 101 103 100  CO2 22 27 26 24   GLUCOSE 100* 113* 96 114*  BUN 6 7 8 8   CALCIUM 8.1* 8.8* 9.3 9.2  CREATININE 0.47 0.45 0.45 0.52  GFRNONAA >60 >60 >60 >60  GFRAA >60 >60 >60 >60    LIVER FUNCTION TESTS: Recent Labs    02/13/20 0822 02/17/20 1955 03/23/20 1108 05/17/20 1052  BILITOT 0.2* 0.5 0.7 0.6  AST 21 51* 19 35  ALT 12 30 14 22   ALKPHOS 93 96 63 92  PROT 5.7* 6.8 6.8 8.2*  ALBUMIN 2.0* 2.4* 2.7* 4.0    TUMOR MARKERS: No results for input(s): AFPTM, CEA, CA199, CHROMGRNA in the last 8760 hours.  Assessment and Plan: Recurrent LLQ abscess s/p perc drain 10/1 Abscess essentially resolved and no definitive fistula on today's imaging. Discussed with pt and daughter concern of removing drain too early given hx of recurrence. Will therefore advise to stop flushing and monitor output over the next week or so. If output remains minimal, will have pt return for drain removal, no further imaging. Hopefully pt can see her surgical team in the meantime as well.  Thank you for this interesting consult.  I greatly enjoyed meeting Debra [name] and look forward to participating in their care.  A copy of this report was sent to the requesting provider on this date.  Electronically Signed: Ascencion Dike 07/03/2020, 2:25 PM

## 2020-07-04 ENCOUNTER — Other Ambulatory Visit: Payer: Self-pay | Admitting: Physician Assistant

## 2020-07-04 DIAGNOSIS — K651 Peritoneal abscess: Secondary | ICD-10-CM

## 2020-07-10 ENCOUNTER — Ambulatory Visit (INDEPENDENT_AMBULATORY_CARE_PROVIDER_SITE_OTHER): Payer: Self-pay | Admitting: General Surgery

## 2020-07-10 ENCOUNTER — Encounter: Payer: Self-pay | Admitting: General Surgery

## 2020-07-10 ENCOUNTER — Other Ambulatory Visit: Payer: Self-pay

## 2020-07-10 VITALS — BP 115/80 | HR 60 | Temp 98.4°F | Resp 12 | Ht 59.0 in | Wt 173.0 lb

## 2020-07-10 DIAGNOSIS — C187 Malignant neoplasm of sigmoid colon: Secondary | ICD-10-CM

## 2020-07-10 NOTE — Patient Instructions (Addendum)
Please try to eat a diet that is rich in protein-  Continue to remove the old packing-wash the wound with soap and water-place new packing into to wound bed. Apply a dry dressing over the area and secure with tape. You may notice a dark grayish draining for the next few days-this is normal- We treated the wound with Silver Nitrate today and this can cause the dark grayish drainage.     Implanted [facility] Guide An implanted port is a device that is placed under the skin. It is usually placed in the chest. The device can be used to give IV medicine, to take blood, or for dialysis. You may have an implanted port if:  You need IV medicine that would be irritating to the small veins in your hands or arms.  You need IV medicines, such as antibiotics, for a long period of time.  You need IV nutrition for a long period of time.  You need dialysis. Having a port means that your health care provider will not need to use the veins in your arms for these procedures. You may have fewer limitations when using a port than you would if you used other types of long-term IVs, and you will likely be able to return to normal activities after your incision heals. An implanted port has two main parts:  Reservoir. The reservoir is the part where a needle is inserted to give medicines or draw blood. The reservoir is round. After it is placed, it appears as a small, raised area under your skin.  Catheter. The catheter is a thin, flexible tube that connects the reservoir to a vein. Medicine that is inserted into the reservoir goes into the catheter and then into the vein. How is my port accessed? To access your port:  A numbing cream may be placed on the skin over the port site.  Your health care provider will put on a mask and sterile gloves.  The skin over your port will be cleaned carefully with a germ-killing soap and allowed to dry.  Your health care provider will gently pinch the port and insert a needle  into it.  Your health care provider will check for a blood return to make sure the port is in the vein and is not clogged.  If your port needs to remain accessed to get medicine continuously (constant infusion), your health care provider will place a clear bandage (dressing) over the needle site. The dressing and needle will need to be changed every week, or as told by your health care provider. What is flushing? Flushing helps keep the port from getting clogged. Follow instructions from your health care provider about how and when to flush the port. Ports are usually flushed with saline solution or a medicine called heparin. The need for flushing will depend on how the port is used:  If the port is only used from time to time to give medicines or draw blood, the port may need to be flushed: ? Before and after medicines have been given. ? Before and after blood has been drawn. ? As part of routine maintenance. Flushing may be recommended every 4-6 weeks.  If a constant infusion is running, the port may not need to be flushed.  Throw away any syringes in a disposal container that is meant for sharp items (sharps container). You can buy a sharps container from a pharmacy, or you can make one by using an empty hard plastic bottle with a cover.  How long will my port stay implanted? The port can stay in for as long as your health care provider thinks it is needed. When it is time for the port to come out, a surgery will be done to remove it. The surgery will be similar to the procedure that was done to put the port in. Follow these instructions at home:   Flush your port as told by your health care provider.  If you need an infusion over several days, follow instructions from your health care provider about how to take care of your port site. Make sure you: ? Wash your hands with soap and water before you change your dressing. If soap and water are not available, use alcohol-based hand  sanitizer. ? Change your dressing as told by your health care provider. ? Place any used dressings or infusion bags into a plastic bag. Throw that bag in the trash. ? Keep the dressing that covers the needle clean and dry. Do not get it wet. ? Do not use scissors or sharp objects near the tube. ? Keep the tube clamped, unless it is being used.  Check your port site every day for signs of infection. Check for: ? Redness, swelling, or pain. ? Fluid or blood. ? Pus or a bad smell.  Protect the skin around the port site. ? Avoid wearing bra straps that rub or irritate the site. ? Protect the skin around your port from seat belts. Place a soft pad over your chest if needed.  Bathe or shower as told by your health care provider. The site may get wet as long as you are not actively receiving an infusion.  Return to your normal activities as told by your health care provider. Ask your health care provider what activities are safe for you.  Carry a medical alert card or wear a medical alert bracelet at all times. This will let health care providers know that you have an implanted port in case of an emergency. Get help right away if:  You have redness, swelling, or pain at the port site.  You have fluid or blood coming from your port site.  You have pus or a bad smell coming from the port site.  You have a fever. Summary  Implanted ports are usually placed in the chest for long-term IV access.  Follow instructions from your health care provider about flushing the port and changing bandages (dressings).  Take care of the area around your port by avoiding clothing that puts pressure on the area, and by watching for signs of infection.  Protect the skin around your port from seat belts. Place a soft pad over your chest if needed.  Get help right away if you have a fever or you have redness, swelling, pain, drainage, or a bad smell at the port site. This information is not intended to  replace advice given to you by your health care provider. Make sure you discuss any questions you have with your health care provider. Document Revised: 12/17/2018 Document Reviewed: 09/27/2016 Elsevier Patient Education  2020 [employer].

## 2020-07-10 NOTE — Progress Notes (Signed)
Debra [name] is here today to discuss replacement of her Port-A-Cath.  She is a 54 year old woman who is status post sigmoid colectomy with primary anastomosis for near obstructing colon cancer.  Today's interview was held with the assistance of a Spanish language interpreter.    Her prior Port-A-Cath needed to be removed as it eroded completely through her skin.  She has had continued issues with healing of her midline wound.  She states that while there is no pus present, there is a thin clear fluid draining at her umbilicus.  She still has a drain in place from an intra-abdominal abscess.  She recently had this interrogated and it appeared to be completely drained without any fistulous connection to bowel.  She is scheduled to see interventional radiology on Thursday to potentially have her drain removed.  She also reports having a cough that is productive of yellowish phlegm.  Past Medical History:  Diagnosis Date  . Anemia   . Colon cancer ([place]) 04/2020  . Hypothyroidism   . Thyroid disease    Past Surgical History:  Procedure Laterality Date  . COLONOSCOPY WITH PROPOFOL N/A 11/21/2019   Procedure: COLONOSCOPY WITH PROPOFOL;  Surgeon: Lin Landsman, MD;  Location: [facility] ENDOSCOPY;  Service: Gastroenterology;  Laterality: N/A;  . CYSTOSCOPY WITH STENT PLACEMENT Left 01/30/2020   Procedure: CYSTOSCOPY WITH STENT PLACEMENT;  Surgeon: Hollice Espy, MD;  Location: [facility] ORS;  Service: Urology;  Laterality: Left;  . ESOPHAGOGASTRODUODENOSCOPY (EGD) WITH PROPOFOL N/A 11/21/2019   Procedure: ESOPHAGOGASTRODUODENOSCOPY (EGD) WITH PROPOFOL;  Surgeon: Lin Landsman, MD;  Location: [facility] ENDOSCOPY;  Service: Gastroenterology;  Laterality: N/A;  . IR RADIOLOGIST EVAL & MGMT  07/03/2020  . PARTIAL COLECTOMY N/A 01/30/2020   Procedure: PARTIAL COLECTOMY Sigmoid;  Surgeon: [name], MD;  Location: [facility] ORS;  Service: General;  Laterality: N/A;  . PORTACATH PLACEMENT Left 03/28/2020    Procedure: INSERTION PORT-A-CATH;  Surgeon: [name], MD;  Location: [facility] ORS;  Service: General;  Laterality: Left;   Family History  Problem Relation Age of Onset  . Diabetes Sister    Social History   Tobacco Use  . Smoking status: Never Smoker  . Smokeless tobacco: Never Used  Vaping Use  . Vaping Use: Never used  Substance Use Topics  . Alcohol use: Never  . Drug use: Never   Current Meds  Medication Sig  . acetaminophen (TYLENOL) 500 MG tablet Take 500 mg by mouth every 6 (six) hours as needed for moderate pain.  . B-D 3CC LUER-LOK SYR 25GX1" 25G X 1" 3 ML MISC Use with B12 injection  . cyanocobalamin (,VITAMIN B-12,) 1000 MCG/ML injection Inject into the muscle.   . docusate sodium (COLACE) 100 MG capsule Take 1 capsule (100 mg total) by mouth daily.  . ferrous sulfate 325 (65 FE) MG tablet Take 1 tablet (325 mg total) by mouth 2 (two) times daily with a meal.  . ibuprofen (ADVIL) 800 MG tablet Take 1 tablet (800 mg total) by mouth every 8 (eight) hours as needed.  Marland Kitchen levothyroxine (SYNTHROID) 137 MCG tablet Take 137 mcg by mouth daily before breakfast.   . lidocaine-prilocaine (EMLA) cream Apply to affected area once  . ondansetron (ZOFRAN) 8 MG tablet Take 1 tablet (8 mg total) by mouth 2 (two) times daily as needed for refractory nausea / vomiting. Start on day 3 after chemotherapy.  . polyethylene glycol (MIRALAX / GLYCOLAX) 17 g packet Take 17 g by mouth daily.   . prochlorperazine (COMPAZINE) 10 MG  tablet Take 1 tablet (10 mg total) by mouth every 6 (six) hours as needed (Nausea or vomiting).  . sodium chloride 0.9 % injection Use 1 prefilled syringe (34mL) to flush drain.   No Known Allergies   Today's Vitals   07/10/20 0958  BP: 115/80  Pulse: 60  Resp: 12  Temp: 98.4 F (36.9 C)  TempSrc: Oral  SpO2: 98%  Weight: 173 lb (78.5 kg)  Height: 4\' 11"  (1.499 m)   Body mass index is 34.94 kg/m.   Physical Exam Constitutional:      General: She is  not in acute distress.    Appearance: She is obese.  HENT:     Head: Normocephalic and atraumatic.     Nose:     Comments: Covered with a mask    Mouth/Throat:     Comments: Covered with a mask Eyes:     General: No scleral icterus.       Right eye: No discharge.        Left eye: No discharge.  Cardiovascular:     Rate and Rhythm: Normal rate and regular rhythm.  Pulmonary:     Effort: Pulmonary effort is normal. No respiratory distress.  Abdominal:     Palpations: Abdomen is soft.     Comments: There is a dressing over her midline scar.  The area adjacent to her umbilicus was undressed.  There is hypertrophic granulation tissue and a small tract there, from which thin serous fluid was expressible.  Her abdominal drain is stained with bilious-appearing fluid.  There is minimal nonpurulent fluid in the drain bulb.  Genitourinary:    Comments: Deferred Musculoskeletal:        General: No deformity or signs of injury.  Skin:    Comments: Vitiligo is present.  The site of her prior Port-A-Cath still has not closed completely.  There is scabbing present without surrounding erythema or induration.  Neurological:     General: No focal deficit present.     Mental Status: She is alert and oriented to person, place, and time.  Psychiatric:        Mood and Affect: Mood normal.        Behavior: Behavior normal.    Impression and plan: This is a 54 year old woman who underwent sigmoid colectomy in May of this year.  She has had ongoing issues with wound healing and abscesses.  Her Port-A-Cath was placed in July, but needed to be removed in September.  It was never used as she was still having wound healing issues with various superficial and deep abscesses.  I do not think it is prudent to place a new Port-A-Cath at this time while her wound remains open.  She is scheduled to follow-up with interventional radiology on Thursday and I am hopeful that they will remove her drain at that time.  I  applied silver nitrate to the hypertrophic granulation tissue at the umbilical portion of her wound.  Hopefully this will allow it to close completely.  She will return in 4 weeks time and if all of her wounds are healed, we will discuss replacement of a chemotherapy port at that time.  She was encouraged to continue to try and improve her nutritional status and eat a diet rich in protein, as she has consistently failed to heal her wounds in a typical fashion and I think malnutrition is a significant contributor to this.  As for her cough, I encouraged her to communicate with her primary care  provider or her oncologist for further evaluation and treatment.

## 2020-07-12 ENCOUNTER — Encounter: Payer: Self-pay | Admitting: *Deleted

## 2020-07-12 ENCOUNTER — Ambulatory Visit
Admission: RE | Admit: 2020-07-12 | Discharge: 2020-07-12 | Disposition: A | Discharge status: Home / Self Care | Payer: Self-pay | Source: Ambulatory Visit | Attending: Physician Assistant | Admitting: Physician Assistant

## 2020-07-12 DIAGNOSIS — K651 Peritoneal abscess: Secondary | ICD-10-CM

## 2020-07-12 HISTORY — PX: IR RADIOLOGIST EVAL & MGMT: IMG5224

## 2020-07-12 NOTE — Progress Notes (Signed)
Referring Physician(s): Schulz,Zachary R  Chief Complaint: The patient is seen in follow up today s/p drainage of a recurrent left lower quadrant abdominal abscess on 06/08/2020  History of present illness: Ms. 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  She underwent aspiration of a right abdominal fluid collection inferior to the liver by our service on 02/09/2020 followed by left lower quadrant abscess drain placement on 04/27/2020.  This drain was subsequently removed by surgical service and patient presented again on 06/08/2020 for drainage of recurrent left lower quadrant abscess.  On 07/03/2020 she underwent follow-up CT abdomen and pelvis as well as drain injection.  CT showed apparent resolution of the abscess cavity and there was no evidence of fistula on catheter injection, however catheter was not removed given small amount of output and history of abscess recurrence.  She presents again today for left lower quadrant drain evaluation.  She currently denies fever, headache, chest pain, dyspnea, cough, worsening abdominal/back pain, nausea, vomiting or bleeding.  She has not been flushing her drain since last visit.  Drain output has been minimal and today appears to be about 5 to 10 cc of light brown fluid.   Past Medical History:  Diagnosis Date  . Anemia   . Colon cancer ([place]) 04/2020  . Hypothyroidism   . Thyroid disease     Past Surgical History:  Procedure Laterality Date  . COLONOSCOPY WITH PROPOFOL N/A 11/21/2019   Procedure: COLONOSCOPY WITH PROPOFOL;  Surgeon: Lin Landsman, MD;  Location: [facility] ENDOSCOPY;  Service: Gastroenterology;  Laterality: N/A;  . CYSTOSCOPY WITH STENT PLACEMENT Left 01/30/2020   Procedure: CYSTOSCOPY WITH STENT PLACEMENT;  Surgeon: Hollice Espy, MD;  Location: [facility] ORS;  Service: Urology;  Laterality: Left;  . ESOPHAGOGASTRODUODENOSCOPY (EGD) WITH PROPOFOL N/A 11/21/2019    Procedure: ESOPHAGOGASTRODUODENOSCOPY (EGD) WITH PROPOFOL;  Surgeon: Lin Landsman, MD;  Location: [facility] ENDOSCOPY;  Service: Gastroenterology;  Laterality: N/A;  . IR RADIOLOGIST EVAL & MGMT  07/03/2020  . PARTIAL COLECTOMY N/A 01/30/2020   Procedure: PARTIAL COLECTOMY Sigmoid;  Surgeon: [name], MD;  Location: [facility] ORS;  Service: General;  Laterality: N/A;  . PORTACATH PLACEMENT Left 03/28/2020   Procedure: INSERTION PORT-A-CATH;  Surgeon: [name], MD;  Location: [facility] ORS;  Service: General;  Laterality: Left;    Allergies: Patient has no known allergies.  Medications: Prior to Admission medications   Medication Sig Start Date End Date Taking? Authorizing Provider  acetaminophen (TYLENOL) 500 MG tablet Take 500 mg by mouth every 6 (six) hours as needed for moderate pain.    [provider]  B-D 3CC LUER-LOK SYR 25GX1" 25G X 1" 3 ML MISC Use with B12 injection 01/10/20   [provider]  cyanocobalamin (,VITAMIN B-12,) 1000 MCG/ML injection Inject into the muscle.  01/10/20   [provider]  docusate sodium (COLACE) 100 MG capsule Take 1 capsule (100 mg total) by mouth daily. 05/18/20   Earlie Server, MD  ferrous sulfate 325 (65 FE) MG tablet Take 1 tablet (325 mg total) by mouth 2 (two) times daily with a meal. 11/22/19   Fritzi Mandes, MD  ibuprofen (ADVIL) 800 MG tablet Take 1 tablet (800 mg total) by mouth every 8 (eight) hours as needed. 02/14/20   Tylene Fantasia, PA-C  levothyroxine (SYNTHROID) 137 MCG tablet Take 137 mcg by mouth daily before breakfast.     [provider]  lidocaine-prilocaine (EMLA) cream Apply to affected area  once 03/08/20   Earlie Server, MD  ondansetron (ZOFRAN) 8 MG tablet Take 1 tablet (8 mg total) by mouth 2 (two) times daily as needed for refractory nausea / vomiting. Start on day 3 after chemotherapy. 03/08/20   Earlie Server, MD  polyethylene glycol (MIRALAX / GLYCOLAX) 17 g packet Take 17 g by mouth daily.     [provider]  prochlorperazine (COMPAZINE) 10 MG tablet Take 1 tablet (10 mg total) by mouth every 6 (six) hours as needed (Nausea or vomiting). 03/08/20   Earlie Server, MD  sodium chloride 0.9 % injection Use 1 prefilled syringe (97mL) to flush drain. 06/18/20   Tylene Fantasia, PA-C     Family History  Problem Relation Age of Onset  . Diabetes Sister     Social History   Socioeconomic History  . Marital status: Single    Spouse name: Not on file  . Number of children: Not on file  . Years of education: Not on file  . Highest education level: Not on file  Occupational History  . Occupation: Factory  Tobacco Use  . Smoking status: Never Smoker  . Smokeless tobacco: Never Used  Vaping Use  . Vaping Use: Never used  Substance and Sexual Activity  . Alcohol use: Never  . Drug use: Never  . Sexual activity: Not on file  Other Topics Concern  . Not on file  Social History Narrative   Lives at home with significant other   Social Determinants of Health   Financial Resource Strain:   . Difficulty of Paying Living Expenses: Not on file  Food Insecurity:   . Worried About Charity fundraiser in the Last Year: Not on file  . Ran Out of Food in the Last Year: Not on file  Transportation Needs:   . Lack of Transportation (Medical): Not on file  . Lack of Transportation (Non-Medical): Not on file  Physical Activity:   . Days of Exercise per Week: Not on file  . Minutes of Exercise per Session: Not on file  Stress:   . Feeling of Stress : Not on file  Social Connections:   . Frequency of Communication with Friends and Family: Not on file  . Frequency of Social Gatherings with Friends and Family: Not on file  . Attends Religious Services: Not on file  . Active Member of Clubs or Organizations: Not on file  . Attends Archivist Meetings: Not on file  . Marital Status: Not on file     Vital Signs: There were no vitals taken for this visit.  Physical Exam awake,  alert.  Left lower abdominal drain intact, insertion site okay, mildly tender to palpation, some crusting noted around catheter insertion site;  approximately 5 to 10 cc of light brown fluid in JP bulb.  Imaging: No results found.  Labs:  CBC: Recent Labs    04/27/20 0841 05/17/20 1052 05/30/20 1215 06/08/20 1323  WBC 4.7 3.7* 4.9 5.6  HGB 10.4* 12.4 10.8* 10.9*  HCT 32.2* 37.8 32.4* 32.5*  PLT 195 150 165 250    COAGS: Recent Labs    11/20/19 0941 04/27/20 0841 06/08/20 1323  INR 1.2 1.1 1.1  APTT 33  --   --     BMP: Recent Labs    03/23/20 1108 04/26/20 1155 05/17/20 1052 05/30/20 1215  NA 130* 135 139 134*  K 3.6 4.0 3.7 3.8  CL 98 101 103 100  CO2 22 27 26  24  GLUCOSE 100* 113* 96 114*  BUN 6 7 8 8   CALCIUM 8.1* 8.8* 9.3 9.2  CREATININE 0.47 0.45 0.45 0.52  GFRNONAA >60 >60 >60 >60  GFRAA >60 >60 >60 >60    LIVER FUNCTION TESTS: Recent Labs    02/13/20 0822 02/17/20 1955 03/23/20 1108 05/17/20 1052  BILITOT 0.2* 0.5 0.7 0.6  AST 21 51* 19 35  ALT 12 30 14 22   ALKPHOS 93 96 63 92  PROT 5.7* 6.8 6.8 8.2*  ALBUMIN 2.0* 2.4* 2.7* 4.0    Assessment: 54 year old female with 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  She underwent aspiration of a right abdominal fluid collection inferior to the liver by our service on 02/09/2020 followed by left lower quadrant abscess drain placement on 04/27/2020.  This drain was subsequently removed by surgical service and patient presented again on 06/08/2020 for drainage of recurrent left lower quadrant abscess.  On 07/03/2020 she underwent follow-up CT abdomen and pelvis as well as drain injection.  CT showed apparent resolution of the abscess cavity and there was no evidence of fistula on catheter injection, however catheter was not removed given small amount of output and history of abscess recurrence.  Patient presents again today for left lower quadrant drain evaluation.  She  currently denies fever, nausea, vomiting or worsening abdominal pain.  She has not been flushing her drain since last visit.  Current output appears to be small.  Following discussions with Dr. Earleen Newport  decision made to remove left lower quadrant drain.  Left lower quadrant drain was removed in its entirety without immediate complications.  Gauze dressing applied over site.  Site care instructions were reviewed with patient via interpreter.   Signed: D. Rowe Robert, PA-C 07/12/2020, 11:24 AM   Please refer to Dr. Pasty Arch attestation of this note for management and plan.      Patient ID: Debra [name], female   DOB: 04-13-1966, 54 y.o.   MRN: [id number]

## 2020-08-09 ENCOUNTER — Other Ambulatory Visit: Payer: Self-pay

## 2020-08-09 ENCOUNTER — Telehealth: Payer: Self-pay

## 2020-08-09 ENCOUNTER — Encounter: Payer: Self-pay | Admitting: General Surgery

## 2020-08-09 ENCOUNTER — Telehealth: Payer: Self-pay | Admitting: General Surgery

## 2020-08-09 ENCOUNTER — Ambulatory Visit (INDEPENDENT_AMBULATORY_CARE_PROVIDER_SITE_OTHER): Payer: Self-pay | Admitting: General Surgery

## 2020-08-09 VITALS — BP 122/62 | HR 67 | Temp 98.3°F | Ht 59.0 in | Wt 186.0 lb

## 2020-08-09 DIAGNOSIS — C187 Malignant neoplasm of sigmoid colon: Secondary | ICD-10-CM

## 2020-08-09 NOTE — Telephone Encounter (Signed)
Please schedule patient for lab/MD (interpreter needed) next week and I will call her with appt details. Thanks

## 2020-08-09 NOTE — Progress Notes (Signed)
Patient ID: Debra [name], female   DOB: July 05, 1966, 54 y.o.   MRN: [id number]  Chief Complaint  Patient presents with  . Follow-up    Follow up: Discuss port placement and wound check    HPI Debra [name] is a 54 y.o. female.   She is a 54 year old woman who underwent a sigmoid colectomy for near obstructing colon cancer back in May of this year.  Her postoperative course was fraught with wound healing issues.  She had a Port-A-Cath placed in July, but it eroded through her skin and needed to be removed about 6 weeks later.  She has continued to have issues with multiple abscesses in her midline wound.  She has had multiple CT scans that have not demonstrated any concern for enteric fistulae.  She was last seen a month ago, at which point the abscesses seemed to have cleared, but she still had a small open portion of her wound.  She was scheduled for a visit today to determine whether or not she would be a candidate to have her port replaced so that she could obtain chemotherapy.  Today's interview was held with the assistance of Spanish language interpreter Leola Brazil.  She reports that overall, she feels improved.  She was able to ambulate into the clinic and to the exam room on her own, which is definitely an improvement.  She denies any fevers or chills.  No nausea or vomiting.  She is complaining of some lower back pain, primarily when she first gets up in the morning.  She and her daughter both report that all of the wounds have healed completely at this point.   Past Medical History:  Diagnosis Date  . Anemia   . Colon cancer ([place]) 04/2020  . Hypothyroidism   . Thyroid disease     Past Surgical History:  Procedure Laterality Date  . COLONOSCOPY WITH PROPOFOL N/A 11/21/2019   Procedure: COLONOSCOPY WITH PROPOFOL;  Surgeon: Lin Landsman, MD;  Location: [facility] ENDOSCOPY;  Service: Gastroenterology;  Laterality: N/A;  . CYSTOSCOPY WITH STENT PLACEMENT Left 01/30/2020    Procedure: CYSTOSCOPY WITH STENT PLACEMENT;  Surgeon: Hollice Espy, MD;  Location: [facility] ORS;  Service: Urology;  Laterality: Left;  . ESOPHAGOGASTRODUODENOSCOPY (EGD) WITH PROPOFOL N/A 11/21/2019   Procedure: ESOPHAGOGASTRODUODENOSCOPY (EGD) WITH PROPOFOL;  Surgeon: Lin Landsman, MD;  Location: [facility] ENDOSCOPY;  Service: Gastroenterology;  Laterality: N/A;  . IR RADIOLOGIST EVAL & MGMT  07/03/2020  . IR RADIOLOGIST EVAL & MGMT  07/12/2020  . PARTIAL COLECTOMY N/A 01/30/2020   Procedure: PARTIAL COLECTOMY Sigmoid;  Surgeon: [name], MD;  Location: [facility] ORS;  Service: General;  Laterality: N/A;  . PORTACATH PLACEMENT Left 03/28/2020   Procedure: INSERTION PORT-A-CATH;  Surgeon: [name], MD;  Location: [facility] ORS;  Service: General;  Laterality: Left;    Family History  Problem Relation Age of Onset  . Diabetes Sister     Social History Social History   Tobacco Use  . Smoking status: Never Smoker  . Smokeless tobacco: Never Used  Vaping Use  . Vaping Use: Never used  Substance Use Topics  . Alcohol use: Never  . Drug use: Never    No Known Allergies  Current Outpatient Medications  Medication Sig Dispense Refill  . acetaminophen (TYLENOL) 500 MG tablet Take 500 mg by mouth every 6 (six) hours as needed for moderate pain.    . B-D 3CC LUER-LOK SYR 25GX1" 25G X 1" 3 ML MISC Use with B12 injection    .  cyanocobalamin (,VITAMIN B-12,) 1000 MCG/ML injection Inject into the muscle.     . docusate sodium (COLACE) 100 MG capsule Take 1 capsule (100 mg total) by mouth daily. 30 capsule 1  . ferrous sulfate 325 (65 FE) MG tablet Take 1 tablet (325 mg total) by mouth 2 (two) times daily with a meal. 60 tablet 3  . ibuprofen (ADVIL) 800 MG tablet Take 1 tablet (800 mg total) by mouth every 8 (eight) hours as needed. 30 tablet 0  . levothyroxine (SYNTHROID) 137 MCG tablet Take 137 mcg by mouth daily before breakfast.     . lidocaine-prilocaine (EMLA) cream Apply to  affected area once 30 g 3  . ondansetron (ZOFRAN) 8 MG tablet Take 1 tablet (8 mg total) by mouth 2 (two) times daily as needed for refractory nausea / vomiting. Start on day 3 after chemotherapy. 30 tablet 1  . polyethylene glycol (MIRALAX / GLYCOLAX) 17 g packet Take 17 g by mouth daily.     . prochlorperazine (COMPAZINE) 10 MG tablet Take 1 tablet (10 mg total) by mouth every 6 (six) hours as needed (Nausea or vomiting). 30 tablet 1  . sodium chloride 0.9 % injection Use 1 prefilled syringe (42mL) to flush drain. 100 mL 5   No current facility-administered medications for this visit.    Review of Systems Review of Systems  Musculoskeletal: Positive for back pain.  All other systems reviewed and are negative. Or as discussed in the history of present illness.  Blood pressure 122/62, pulse 67, temperature 98.3 F (36.8 C), temperature source Oral, height 4\' 11"  (1.499 m), weight 186 lb (84.4 kg), SpO2 96 %. Body mass index is 37.57 kg/m.  Physical Exam Physical Exam Constitutional:      Appearance: Normal appearance. She is obese. She is not toxic-appearing.  HENT:     Head: Normocephalic and atraumatic.     Nose:     Comments: Covered with a mask    Mouth/Throat:     Comments: Covered with a mask Eyes:     General: No scleral icterus.       Right eye: No discharge.        Left eye: No discharge.  Cardiovascular:     Rate and Rhythm: Normal rate and regular rhythm.  Pulmonary:     Effort: Pulmonary effort is normal. No respiratory distress.  Chest:       Comments: Healed port site Abdominal:     Palpations: Abdomen is soft.       Comments: Her midline wound appears to have finally healed completely.  There are no open areas and no drainage.  Musculoskeletal:     Cervical back: No rigidity.  Lymphadenopathy:     Cervical: No cervical adenopathy.  Skin:    General: Skin is warm and dry.     Comments:  Vitiligo present.  Neurological:     General: No focal deficit  present.     Mental Status: She is alert and oriented to person, place, and time.  Psychiatric:        Mood and Affect: Mood normal.        Behavior: Behavior normal.     Data Reviewed I reviewed my prior clinic notes, as well as the CT scans that have been performed.  The most recent CT scan did not demonstrate any additional intra-abdominal fluid collections.  Her last progress note in the electronic medical record was from interventional radiology on 12 July 2020.  At that time,  her existing drain was removed.  It does not appear that she has been seen in oncology since September.  Based on the last clinic note, FOLFOX regimen is intended, once a Port-A-Cath can be placed.  Assessment This is a 54 year old woman with stage IIIb colon cancer.  Her postoperative course has been somewhat challenging due to very poor wound healing.  At this time, it does appear that all wounds and abscesses have been resolved and she is a candidate for port placement again.  Plan We will schedule her for outpatient Port-A-Cath placement.  I discussed the risks of the procedure with the patient.  These include, but are not limited to, bleeding, infection, wound healing complications, port malfunction, port infection, need to remove the port, pneumothorax and subsequent need for chest tube/hospitalization.  She had the opportunity to ask questions and these were answered.  We will work on getting her scheduled.    [name] 08/09/2020, 11:17 AM

## 2020-08-09 NOTE — Telephone Encounter (Signed)
Outgoing call is made, left message for patient to call.  Please advise patient of Pre-Admission date/time, COVID Testing date and Surgery date.  Spanish form is also mailed to the patient.     Surgery Date: 08/29/20 Preadmission Testing Date: 08/23/20 (phone 8a-1p) Covid Testing Date: 08/27/20 - patient advised to go to the [facility] between 8a-1p   Also patient needs to call [phone], between 1-3:00pm the day before surgery, to find out what time to arrive for surgery.

## 2020-08-09 NOTE — Telephone Encounter (Signed)
Incoming call from daughter, Sherrlyn Hock, she is informed of all dates regarding surgery and they voice understanding.

## 2020-08-09 NOTE — Patient Instructions (Addendum)
Debra [name], nuestra programadora de [place], se comunicar con usted en las prximas 24 a 48 horas. Durante esa llamada, hablar sobre la preparacin antes de la ciruga y tambin hablar sobre la fecha y hora de la [place]. Tenga a mano la hoja rosada cuando se comunique con usted. Si tiene [place] [employer]. Por favor llame a nuestra oficina.  Insercin del dispositivo de perfusin implantable Implanted Port Insertion La insercin de un dispositivo de perfusin implantable es un procedimiento realizado para Glass blower/designer un dispositivo y Holiday representative. El dispositivo tiene un disco inyectable al cual puede acceder su mdico. El dispositivo se conecta con una vena del pecho o el cuello mediante una pequea sonda flexible (catter). Hay diferentes tipos de dispositivos. Los dispositivos de perfusin implantables se usan para poder proporcionar lo siguiente mediante un Diplomatic Services operational officer (i.v.) a largo plazo:  Medicamentos como la quimioterapia.  Lquidos.  Nutricin lquida, como nutricin parenteral total (NPT). Al tener usted un dispositivo, su mdico no tendr que recurrir a las venas de los brazos para estos procedimientos. Informe al mdico acerca de lo siguiente:  Cualquier alergia que tenga.  Todos los medicamentos que [place], especialmente anticoagulantes y vitaminas, hierbas, gotas oftlmicas, cremas, medicamentos de [place] y corticoesteroides.  Cualquier problema previo que usted o algn miembro de su familia haya tenido con los anestsicos.  Cualquier trastorno de la sangre que tenga.  Cirugas a las que se haya sometido.  Cualquier enfermedad que tenga o haya tenido; por ejemplo, diabetes o problemas renales.  Si est embarazada o podra estarlo. Cules son los riesgos? En general, se trata de un procedimiento seguro. Sin embargo, pueden ocurrir Arboriculturist, por ejemplo:  Chief of Staff a los medicamentos o a los tintes (sustancias de [place]).  Daos a  otras estructuras u rganos.  Infeccin.  Daos en los vasos sanguneos, moretones o Furniture conservator/restorer de la puncin.  Cogulo de [place].  Laceracin de la piel alrededor del dispositivo.  Una acumulacin de aire en el pecho que puede provocar que uno de sus pulmones colapse (neumotrax). Esto es poco frecuente. Qu ocurre antes del procedimiento? Medicamentos  Consulte al mdico sobre: ? Quarry manager o suspender los medicamentos que toma habitualmente. Esto es muy importante si toma medicamentos para la diabetes o anticoagulantes. ? Tomar medicamentos como aspirina e ibuprofeno. Estos medicamentos pueden tener un efecto anticoagulante en la [place]. No tome estos medicamentos a menos que el mdico se lo indique. ? Tomar medicamentos de [employer], vitaminas, hierbas y suplementos. Hidratacin Siga las indicaciones del mdico acerca de mantenerse hidratado, las cuales pueden incluir lo siguiente:  Hasta 2horas antes del procedimiento, puede beber lquidos transparentes, como agua, jugos de fruta sin pulpa, caf negro y t solo.  Restricciones en las comidas y bebidas  Siga las indicaciones del mdico respecto de las comidas y bebidas, las cuales pueden incluir lo siguiente: ? 8 horas antes del procedimiento, no coma alimentos pesados, por ejemplo, carnes, alimentos con alto contenido graso o fritos. ? 6 horas antes del procedimiento, deje de ingerir comidas o alimentos livianos, como tostadas o cereales. ? 6 horas antes del procedimiento, deje de tomar leche o bebidas que [employer]. ? 2 horas antes del procedimiento, deje de beber lquidos transparentes. Indicaciones generales  Haga que alguien lo lleve a su casa desde el hospital o la clnica.  Si se ir a su casa inmediatamente despus del procedimiento, planifique que alguien se quede con usted durante 24horas.  Posiblemente deba realizarse anlisis de [place].  No  consuma ningn producto que contenga nicotina o  tabaco como mnimo durante 4 a 6semanas antes del procedimiento. Estos productos incluyen cigarrillos, cigarrillos electrnicos y tabaco para Higher education careers adviser. Si necesita ayuda para dejar de fumar, consulte al [employer].  Pregntele al mdico qu medidas se tomarn para ayudar a prevenir una infeccin. Estas medidas pueden incluir: ? Rasurar el vello del lugar de la ciruga. ? Lavar la piel con un jabn antisptico. ? Tomar antibiticos. Qu ocurre durante el procedimiento?   Le colocarn una va intravenosa en una vena.  Le administrarn uno o ms de los siguientes medicamentos: ? Un medicamento para ayudarlo a relajarse (sedante). ? Un medicamento para adormecer la zona (anestesia local).  Le realizarn dos incisiones pequeas para insertar el dispositivo. ? Le harn una incisin ms pequea en el cuello para tener acceso a la vena en la cual se Hydrologist. ? Le harn la otra incisin en la parte superior del trax. All permanecer el dispositivo.  Se tomarn radiografas constantemente (fluoroscopia) u obtendrn otras imgenes mediante otras herramientas diagnstico por imgenes como gua durante el procedimiento.  Se colocarn el dispositivo y catter. Puede que haya un rea pequea y elevada en donde se encuentra el dispositivo.  El dispositivo se purgar con solucin salina y se extraer sangre para garantizar que est funcione correctamente.  Se cerrarn las incisiones.  Puede que le coloquen vendas (vendaje) sobre las incisiones. El procedimiento puede variar segn el mdico y el hospital. Sander Nephew ocurre despus del procedimiento?  Le controlarn la presin arterial, la frecuencia cardaca, la frecuencia respiratoria y Retail buyer de oxgeno en la sangre hasta que le den el alta del hospital o la clnica.  No conduzca durante 24horas si le administraron un sedante durante el procedimiento.  Le darn una tarjeta con informacin del fabricante en la que se indicar el tipo de  dispositivo que tiene. Llvela con usted.  Se debe purgar y revisar el dispositivo como se lo haya indicado su mdico, generalmente esto se hace despus de algunas semanas.  Se realizar una radiografa de trax para: ? Controlar la ubicacin del dispositivo. ? Controlar el pulmn y asegurarse de que no se haya daado. Resumen  La insercin de un dispositivo de perfusin implantable es un procedimiento realizado para Glass blower/designer un dispositivo y Holiday representative.  Los dispositivos de perfusin implantables se usan para poder proporcionar un acceso intravenoso (i.v.) a Barrister's clerk.  Se debe purgar y revisar el dispositivo como se lo haya indicado su mdico, generalmente esto se hace despus de algunas semanas.  Tenga la tarjeta de informacin del fabricante con usted en todo momento. Esta informacin no tiene Marine scientist el consejo del mdico. Asegrese de hacerle al mdico cualquier pregunta que tenga. Document Revised: 04/28/2018 Document Reviewed: 04/28/2018 Elsevier Patient Education  2020 [employer].

## 2020-08-09 NOTE — Telephone Encounter (Signed)
-----   Message from Earlie Server, MD sent at 08/09/2020  4:21 PM EST ----- She is very out of the window of adjuvant chemo. Let me see her next week, lab cbc cmp CEA and MD. I would like to further discuss w her. Thanks.  ----- Message ----- From: Vanice Sarah, CMA Sent: 08/09/2020   3:22 PM EST To: Evelina Dun, RN, Vanice Sarah, CMA, #  Patient is scheduled for port placement on 12/22

## 2020-08-09 NOTE — H&P (View-Only) (Signed)
Patient ID: Debra [name], female   DOB: Aug 27, 1966, 54 y.o.   MRN: [id number]  Chief Complaint  Patient presents with  . Follow-up    Follow up: Discuss port placement and wound check    HPI Debra [name] Schiavi is a 54 y.o. female.   She is a 54 year old woman who underwent a sigmoid colectomy for near obstructing colon cancer back in May of this year.  Her postoperative course was fraught with wound healing issues.  She had a Port-A-Cath placed in July, but it eroded through her skin and needed to be removed about 6 weeks later.  She has continued to have issues with multiple abscesses in her midline wound.  She has had multiple CT scans that have not demonstrated any concern for enteric fistulae.  She was last seen a month ago, at which point the abscesses seemed to have cleared, but she still had a small open portion of her wound.  She was scheduled for a visit today to determine whether or not she would be a candidate to have her port replaced so that she could obtain chemotherapy.  Today's interview was held with the assistance of Spanish language interpreter Leola Brazil.  She reports that overall, she feels improved.  She was able to ambulate into the clinic and to the exam room on her own, which is definitely an improvement.  She denies any fevers or chills.  No nausea or vomiting.  She is complaining of some lower back pain, primarily when she first gets up in the morning.  She and her daughter both report that all of the wounds have healed completely at this point.   Past Medical History:  Diagnosis Date  . Anemia   . Colon cancer ([place]) 04/2020  . Hypothyroidism   . Thyroid disease     Past Surgical History:  Procedure Laterality Date  . COLONOSCOPY WITH PROPOFOL N/A 11/21/2019   Procedure: COLONOSCOPY WITH PROPOFOL;  Surgeon: Lin Landsman, MD;  Location: [facility] ENDOSCOPY;  Service: Gastroenterology;  Laterality: N/A;  . CYSTOSCOPY WITH STENT PLACEMENT Left 01/30/2020    Procedure: CYSTOSCOPY WITH STENT PLACEMENT;  Surgeon: Hollice Espy, MD;  Location: [facility] ORS;  Service: Urology;  Laterality: Left;  . ESOPHAGOGASTRODUODENOSCOPY (EGD) WITH PROPOFOL N/A 11/21/2019   Procedure: ESOPHAGOGASTRODUODENOSCOPY (EGD) WITH PROPOFOL;  Surgeon: Lin Landsman, MD;  Location: [facility] ENDOSCOPY;  Service: Gastroenterology;  Laterality: N/A;  . IR RADIOLOGIST EVAL & MGMT  07/03/2020  . IR RADIOLOGIST EVAL & MGMT  07/12/2020  . PARTIAL COLECTOMY N/A 01/30/2020   Procedure: PARTIAL COLECTOMY Sigmoid;  Surgeon: [name], MD;  Location: [facility] ORS;  Service: General;  Laterality: N/A;  . PORTACATH PLACEMENT Left 03/28/2020   Procedure: INSERTION PORT-A-CATH;  Surgeon: [name], MD;  Location: [facility] ORS;  Service: General;  Laterality: Left;    Family History  Problem Relation Age of Onset  . Diabetes Sister     Social History Social History   Tobacco Use  . Smoking status: Never Smoker  . Smokeless tobacco: Never Used  Vaping Use  . Vaping Use: Never used  Substance Use Topics  . Alcohol use: Never  . Drug use: Never    No Known Allergies  Current Outpatient Medications  Medication Sig Dispense Refill  . acetaminophen (TYLENOL) 500 MG tablet Take 500 mg by mouth every 6 (six) hours as needed for moderate pain.    . B-D 3CC LUER-LOK SYR 25GX1" 25G X 1" 3 ML MISC Use with B12 injection    .  cyanocobalamin (,VITAMIN B-12,) 1000 MCG/ML injection Inject into the muscle.     . docusate sodium (COLACE) 100 MG capsule Take 1 capsule (100 mg total) by mouth daily. 30 capsule 1  . ferrous sulfate 325 (65 FE) MG tablet Take 1 tablet (325 mg total) by mouth 2 (two) times daily with a meal. 60 tablet 3  . ibuprofen (ADVIL) 800 MG tablet Take 1 tablet (800 mg total) by mouth every 8 (eight) hours as needed. 30 tablet 0  . levothyroxine (SYNTHROID) 137 MCG tablet Take 137 mcg by mouth daily before breakfast.     . lidocaine-prilocaine (EMLA) cream Apply to  affected area once 30 g 3  . ondansetron (ZOFRAN) 8 MG tablet Take 1 tablet (8 mg total) by mouth 2 (two) times daily as needed for refractory nausea / vomiting. Start on day 3 after chemotherapy. 30 tablet 1  . polyethylene glycol (MIRALAX / GLYCOLAX) 17 g packet Take 17 g by mouth daily.     . prochlorperazine (COMPAZINE) 10 MG tablet Take 1 tablet (10 mg total) by mouth every 6 (six) hours as needed (Nausea or vomiting). 30 tablet 1  . sodium chloride 0.9 % injection Use 1 prefilled syringe (80mL) to flush drain. 100 mL 5   No current facility-administered medications for this visit.    Review of Systems Review of Systems  Musculoskeletal: Positive for back pain.  All other systems reviewed and are negative. Or as discussed in the history of present illness.  Blood pressure 122/62, pulse 67, temperature 98.3 F (36.8 C), temperature source Oral, height 4\' 11"  (1.499 m), weight 186 lb (84.4 kg), SpO2 96 %. Body mass index is 37.57 kg/m.  Physical Exam Physical Exam Constitutional:      Appearance: Normal appearance. She is obese. She is not toxic-appearing.  HENT:     Head: Normocephalic and atraumatic.     Nose:     Comments: Covered with a mask    Mouth/Throat:     Comments: Covered with a mask Eyes:     General: No scleral icterus.       Right eye: No discharge.        Left eye: No discharge.  Cardiovascular:     Rate and Rhythm: Normal rate and regular rhythm.  Pulmonary:     Effort: Pulmonary effort is normal. No respiratory distress.  Chest:       Comments: Healed port site Abdominal:     Palpations: Abdomen is soft.       Comments: Her midline wound appears to have finally healed completely.  There are no open areas and no drainage.  Musculoskeletal:     Cervical back: No rigidity.  Lymphadenopathy:     Cervical: No cervical adenopathy.  Skin:    General: Skin is warm and dry.     Comments:  Vitiligo present.  Neurological:     General: No focal deficit  present.     Mental Status: She is alert and oriented to person, place, and time.  Psychiatric:        Mood and Affect: Mood normal.        Behavior: Behavior normal.     Data Reviewed I reviewed my prior clinic notes, as well as the CT scans that have been performed.  The most recent CT scan did not demonstrate any additional intra-abdominal fluid collections.  Her last progress note in the electronic medical record was from interventional radiology on 12 July 2020.  At that time,  her existing drain was removed.  It does not appear that she has been seen in oncology since September.  Based on the last clinic note, FOLFOX regimen is intended, once a Port-A-Cath can be placed.  Assessment This is a 54 year old woman with stage IIIb colon cancer.  Her postoperative course has been somewhat challenging due to very poor wound healing.  At this time, it does appear that all wounds and abscesses have been resolved and she is a candidate for port placement again.  Plan We will schedule her for outpatient Port-A-Cath placement.  I discussed the risks of the procedure with the patient.  These include, but are not limited to, bleeding, infection, wound healing complications, port malfunction, port infection, need to remove the port, pneumothorax and subsequent need for chest tube/hospitalization.  She had the opportunity to ask questions and these were answered.  We will work on getting her scheduled.    [name] 08/09/2020, 11:17 AM

## 2020-08-10 NOTE — Telephone Encounter (Signed)
Patient's daughter, Janne Napoleon, informed of appts on Monday.

## 2020-08-10 NOTE — Telephone Encounter (Signed)
Done.Marland Kitchen...  Pt is sched for 12/6  lab @ 100 MD @ 115

## 2020-08-13 ENCOUNTER — Encounter: Payer: Self-pay | Admitting: Oncology

## 2020-08-13 ENCOUNTER — Inpatient Hospital Stay (HOSPITAL_BASED_OUTPATIENT_CLINIC_OR_DEPARTMENT_OTHER): Payer: Self-pay | Admitting: Oncology

## 2020-08-13 ENCOUNTER — Inpatient Hospital Stay: Payer: Self-pay | Attending: Oncology

## 2020-08-13 ENCOUNTER — Other Ambulatory Visit: Payer: Self-pay

## 2020-08-13 VITALS — BP 120/78 | HR 68 | Temp 97.5°F | Resp 18 | Wt 186.9 lb

## 2020-08-13 DIAGNOSIS — C187 Malignant neoplasm of sigmoid colon: Secondary | ICD-10-CM | POA: Insufficient documentation

## 2020-08-13 DIAGNOSIS — E039 Hypothyroidism, unspecified: Secondary | ICD-10-CM | POA: Insufficient documentation

## 2020-08-13 DIAGNOSIS — Z5111 Encounter for antineoplastic chemotherapy: Secondary | ICD-10-CM | POA: Insufficient documentation

## 2020-08-13 DIAGNOSIS — D500 Iron deficiency anemia secondary to blood loss (chronic): Secondary | ICD-10-CM | POA: Insufficient documentation

## 2020-08-13 DIAGNOSIS — Z79899 Other long term (current) drug therapy: Secondary | ICD-10-CM | POA: Insufficient documentation

## 2020-08-13 LAB — CBC WITH DIFFERENTIAL/PLATELET
Abs Immature Granulocytes: 0.01 10*3/uL (ref 0.00–0.07)
Basophils Absolute: 0 10*3/uL (ref 0.0–0.1)
Basophils Relative: 0 %
Eosinophils Absolute: 0.2 10*3/uL (ref 0.0–0.5)
Eosinophils Relative: 4 %
HCT: 34.3 % — ABNORMAL LOW (ref 36.0–46.0)
Hemoglobin: 11.5 g/dL — ABNORMAL LOW (ref 12.0–15.0)
Immature Granulocytes: 0 %
Lymphocytes Relative: 28 %
Lymphs Abs: 1.1 10*3/uL (ref 0.7–4.0)
MCH: 29.2 pg (ref 26.0–34.0)
MCHC: 33.5 g/dL (ref 30.0–36.0)
MCV: 87.1 fL (ref 80.0–100.0)
Monocytes Absolute: 0.5 10*3/uL (ref 0.1–1.0)
Monocytes Relative: 12 %
Neutro Abs: 2.3 10*3/uL (ref 1.7–7.7)
Neutrophils Relative %: 56 %
Platelets: 135 10*3/uL — ABNORMAL LOW (ref 150–400)
RBC: 3.94 MIL/uL (ref 3.87–5.11)
RDW: 13.8 % (ref 11.5–15.5)
WBC: 4.1 10*3/uL (ref 4.0–10.5)
nRBC: 0 % (ref 0.0–0.2)

## 2020-08-13 LAB — COMPREHENSIVE METABOLIC PANEL
ALT: 16 U/L (ref 0–44)
AST: 24 U/L (ref 15–41)
Albumin: 4 g/dL (ref 3.5–5.0)
Alkaline Phosphatase: 84 U/L (ref 38–126)
Anion gap: 8 (ref 5–15)
BUN: 13 mg/dL (ref 6–20)
CO2: 28 mmol/L (ref 22–32)
Calcium: 9.2 mg/dL (ref 8.9–10.3)
Chloride: 101 mmol/L (ref 98–111)
Creatinine, Ser: 0.45 mg/dL (ref 0.44–1.00)
GFR, Estimated: >60 mL/min (ref >60)
Glucose, Bld: 135 mg/dL — ABNORMAL HIGH (ref 70–99)
Potassium: 4.2 mmol/L (ref 3.5–5.1)
Sodium: 137 mmol/L (ref 135–145)
Total Bilirubin: 0.6 mg/dL (ref 0.3–1.2)
Total Protein: 7.4 g/dL (ref 6.5–8.1)

## 2020-08-13 NOTE — Progress Notes (Signed)
Patient here for follow up. Reports that when she is up for a while she is starts feeling aches on her hip area.

## 2020-08-13 NOTE — Progress Notes (Signed)
Hematology/Oncology Progress Note [facility] Telephone:([phone] [phone]  Patient Care Team: [name], MD as PCP - General (Family Medicine) [name], RN as Registered Nurse [name], RN as Registered Nurse   Name of the patient: Debra [name]  [id number]  1966/02/26  Date of visit: 08/13/20  PERTINENT ONCOLOGY HISTORY Debra [name] is a 54 y.o.afemale who has above oncology history reviewed by me today presented for follow up visit for management of Stage IIIB Colon cancer seen by me on 11/21/2019 during her hospitalization. Patient has a history of Covid positivity. Severe iron deficiency status post IV Venofer treatment. Colonoscopy 11/21/2019 showed a malignant appearing severe stenosis found in the sigmoid colon at 25 cm proximal to the anus and was nontransversed.  Biopsy was taken.  Surgical pathology is pending  Nonbleeding external hemorrhoids Biopsy was not diagnostic 01/03/2020 virtual colonoscopy showed long segment area of wall thickening and annular constriction within the sigmoid colon in the area of previous concern most compatible with annular malignancy.  Remainder of the colon evaluation is somewhat limited due to large amount of retained stool and incomplete distention proximal to the annular constricting lesion. 01/30/2020 Patient underwent left hemicolectomy by Dr. Celine Ahr and a biopsy showed invasive adenocarcinoma, poorly differentiated.  37 lymph nodes negative for malignancy.  Grade 3, all margins are uninvolved.  Perineural invasion present.  Tumor deposit present. pT3 pN1c cM0 Checks x-ray did not show any lung mass. #Postop, patient developed postop ileus and she required TPN, PICC and NG tube placement.  Postoperation day 9 she continued to have increased leukocytosis and was subsequently found to have a midline wound infection to the inferior portion of the wound.  Patient was treated with  antibiotics.  INTERVAL HISTORY-  54 y.o. female presents for management of colon cancer. Unfortunately patient has had a prolonged course of post surgical complications including wound infection as well as intra-abdominal abscess which she has to have a catheter drainage tube for several months,  Today patient reports feeling well.  Only complaint she has is some hip pain. 07/03/2020 CT abdomen pelvis w contrast showed well positioned drainage catheter with apparent resolution of the intra-abdominal abscess. 07/10/2020 seen by [facility] and patient had drainage catheter removed by IR on 07/12/2020 Patient was accompanied by her daughter today to discuss management plan.  She has gained weight.   Review of systems- Review of Systems  Constitutional: Negative for appetite change, chills, fatigue, fever and unexpected weight change.  HENT:   Negative for hearing loss and voice change.   Eyes: Negative for eye problems.  Respiratory: Negative for chest tightness, cough and shortness of breath.   Cardiovascular: Negative for chest pain.  Gastrointestinal: Negative for abdominal distention, abdominal pain, blood in stool and constipation.  Endocrine: Negative for hot flashes.  Genitourinary: Negative for difficulty urinating, dysuria and frequency.   Musculoskeletal: Negative for arthralgias.  Skin: Negative for itching, rash and wound.  Neurological: Negative for extremity weakness, headaches and light-headedness.  Hematological: Negative for adenopathy.  Psychiatric/Behavioral: Negative for confusion.    No Known Allergies  Patient Active Problem List   Diagnosis Date Noted  . Loss of weight 02/15/2020  . Goals of care, counseling/discussion 02/15/2020  . Malignant neoplasm of sigmoid colon ([place]) 02/15/2020  . Pressure injury of skin 02/03/2020  . Bowel obstruction ([place]) 01/29/2020  . Iron deficiency anemia due to chronic blood loss 11/22/2019  . COVID-19 virus detected 11/20/2019  .  Symptomatic anemia  11/20/2019  . Hypothyroidism 11/20/2019  . Iron deficiency anemia 11/20/2019  . GIB (gastrointestinal bleeding) 11/20/2019     Past Medical History:  Diagnosis Date  . Anemia   . Colon cancer ([place]) 04/2020  . Hypothyroidism   . Thyroid disease      Past Surgical History:  Procedure Laterality Date  . COLONOSCOPY WITH PROPOFOL N/A 11/21/2019   Procedure: COLONOSCOPY WITH PROPOFOL;  Surgeon: Lin Landsman, MD;  Location: [facility] ENDOSCOPY;  Service: Gastroenterology;  Laterality: N/A;  . CYSTOSCOPY WITH STENT PLACEMENT Left 01/30/2020   Procedure: CYSTOSCOPY WITH STENT PLACEMENT;  Surgeon: Hollice Espy, MD;  Location: [facility] ORS;  Service: Urology;  Laterality: Left;  . ESOPHAGOGASTRODUODENOSCOPY (EGD) WITH PROPOFOL N/A 11/21/2019   Procedure: ESOPHAGOGASTRODUODENOSCOPY (EGD) WITH PROPOFOL;  Surgeon: Lin Landsman, MD;  Location: [facility] ENDOSCOPY;  Service: Gastroenterology;  Laterality: N/A;  . IR RADIOLOGIST EVAL & MGMT  07/03/2020  . IR RADIOLOGIST EVAL & MGMT  07/12/2020  . PARTIAL COLECTOMY N/A 01/30/2020   Procedure: PARTIAL COLECTOMY Sigmoid;  Surgeon: [name], MD;  Location: [facility] ORS;  Service: General;  Laterality: N/A;  . PORTACATH PLACEMENT Left 03/28/2020   Procedure: INSERTION PORT-A-CATH;  Surgeon: [name], MD;  Location: [facility] ORS;  Service: General;  Laterality: Left;    Social History   Socioeconomic History  . Marital status: Single    Spouse name: Not on file  . Number of children: Not on file  . Years of education: Not on file  . Highest education level: Not on file  Occupational History  . Occupation: Factory  Tobacco Use  . Smoking status: Never Smoker  . Smokeless tobacco: Never Used  Vaping Use  . Vaping Use: Never used  Substance and Sexual Activity  . Alcohol use: Never  . Drug use: Never  . Sexual activity: Not on file  Other Topics Concern  . Not on file  Social History Narrative   Lives at home with  significant other   Social Determinants of Health   Financial Resource Strain:   . Difficulty of Paying Living Expenses: Not on file  Food Insecurity:   . Worried About Charity fundraiser in the Last Year: Not on file  . Ran Out of Food in the Last Year: Not on file  Transportation Needs:   . Lack of Transportation (Medical): Not on file  . Lack of Transportation (Non-Medical): Not on file  Physical Activity:   . Days of Exercise per Week: Not on file  . Minutes of Exercise per Session: Not on file  Stress:   . Feeling of Stress : Not on file  Social Connections:   . Frequency of Communication with Friends and Family: Not on file  . Frequency of Social Gatherings with Friends and Family: Not on file  . Attends Religious Services: Not on file  . Active Member of Clubs or Organizations: Not on file  . Attends Archivist Meetings: Not on file  . Marital Status: Not on file  Intimate Partner Violence:   . Fear of Current or Ex-Partner: Not on file  . Emotionally Abused: Not on file  . Physically Abused: Not on file  . Sexually Abused: Not on file     Family History  Problem Relation Age of Onset  . Diabetes Sister      Current Outpatient Medications:  .  acetaminophen (TYLENOL) 500 MG tablet, Take 500 mg by mouth every 6 (six) hours as needed for moderate pain., Disp: ,  Rfl:  .  B-D 3CC LUER-LOK SYR 25GX1" 25G X 1" 3 ML MISC, Use with B12 injection, Disp: , Rfl:  .  cyanocobalamin (,VITAMIN B-12,) 1000 MCG/ML injection, Inject into the muscle. , Disp: , Rfl:  .  docusate sodium (COLACE) 100 MG capsule, Take 1 capsule (100 mg total) by mouth daily., Disp: 30 capsule, Rfl: 1 .  ibuprofen (ADVIL) 800 MG tablet, Take 1 tablet (800 mg total) by mouth every 8 (eight) hours as needed., Disp: 30 tablet, Rfl: 0 .  levothyroxine (SYNTHROID) 137 MCG tablet, Take 137 mcg by mouth daily before breakfast. , Disp: , Rfl:  .  sodium chloride 0.9 % injection, Use 1 prefilled syringe  (46m) to flush drain., Disp: 100 mL, Rfl: 5 .  ferrous sulfate 325 (65 FE) MG tablet, Take 1 tablet (325 mg total) by mouth 2 (two) times daily with a meal. (Patient not taking: Reported on 08/13/2020), Disp: 60 tablet, Rfl: 3 .  lidocaine-prilocaine (EMLA) cream, Apply to affected area once (Patient not taking: Reported on 08/13/2020), Disp: 30 g, Rfl: 3 .  ondansetron (ZOFRAN) 8 MG tablet, Take 1 tablet (8 mg total) by mouth 2 (two) times daily as needed for refractory nausea / vomiting. Start on day 3 after chemotherapy. (Patient not taking: Reported on 08/13/2020), Disp: 30 tablet, Rfl: 1 .  polyethylene glycol (MIRALAX / GLYCOLAX) 17 g packet, Take 17 g by mouth daily.  (Patient not taking: Reported on 08/13/2020), Disp: , Rfl:  .  prochlorperazine (COMPAZINE) 10 MG tablet, Take 1 tablet (10 mg total) by mouth every 6 (six) hours as needed (Nausea or vomiting). (Patient not taking: Reported on 08/13/2020), Disp: 30 tablet, Rfl: 1   Physical exam:  Vitals:   08/13/20 1355  BP: 120/78  Pulse: 68  Resp: 18  Temp: (!) 97.5 F (36.4 C)  Weight: 186 lb 14.4 oz (84.8 kg)   Physical Exam Constitutional:      General: She is not in acute distress.    Appearance: She is obese.     Comments: Patient walks independently  HENT:     Head: Normocephalic and atraumatic.     Mouth/Throat:     Pharynx: No oropharyngeal exudate.  Eyes:     General: No scleral icterus.    Pupils: Pupils are equal, round, and reactive to light.  Cardiovascular:     Rate and Rhythm: Normal rate and regular rhythm.     Heart sounds: No murmur heard.   Pulmonary:     Effort: Pulmonary effort is normal. No respiratory distress.  Abdominal:     General: There is no distension.     Palpations: Abdomen is soft.     Tenderness: There is no abdominal tenderness.  Musculoskeletal:        General: Normal range of motion.     Cervical back: Normal range of motion and neck supple.  Skin:    General: Skin is warm and dry.      Findings: No erythema.  Neurological:     Mental Status: She is alert and oriented to person, place, and time.  Psychiatric:        Mood and Affect: Affect normal.        CMP Latest Ref Rng & Units 08/13/2020  Glucose 70 - 99 mg/dL 135(H)  BUN 6 - 20 mg/dL 13  Creatinine 0.44 - 1.00 mg/dL 0.45  Sodium 135 - 145 mmol/L 137  Potassium 3.5 - 5.1 mmol/L 4.2  Chloride 98 - 111  mmol/L 101  CO2 22 - 32 mmol/L 28  Calcium 8.9 - 10.3 mg/dL 9.2  Total Protein 6.5 - 8.1 g/dL 7.4  Total Bilirubin 0.3 - 1.2 mg/dL 0.6  Alkaline Phos 38 - 126 U/L 84  AST 15 - 41 U/L 24  ALT 0 - 44 U/L 16   CBC Latest Ref Rng & Units 08/13/2020  WBC 4.0 - 10.5 K/uL 4.1  Hemoglobin 12.0 - 15.0 g/dL 11.5(L)  Hematocrit 36 - 46 % 34.3(L)  Platelets 150 - 400 K/uL 135(L)    RADIOGRAPHIC STUDIES: I have personally reviewed the radiological images as listed and agreed with the findings in the report. No results found.  Assessment and plan-  1. Malignant neoplasm of sigmoid colon [facility])   Cancer Staging Malignant neoplasm of sigmoid colon ([place]) Staging form: Colon and Rectum, AJCC 8th Edition - Clinical: No stage assigned - Unsigned - Pathologic: Stage IIIB (pT3, pN1c, cM0) - Signed by Earlie Server, MD on 02/15/2020  #Stage IIIb colon cancer- 01/30/2020 pT3 pN1c, MMR intact  We have discussed the rationale of adjuvant chemotherapy previously.  Due to the complicated post operation course with wound infection and intra-abdominal wound, plan of adjuvant chemotherapy is significantly delayed.  I had a lenghty discussion with patient and daughter, patients with adjuvant chemotherapy initiated outside of 8 weeks window have worse outcome. I recommend to obtain baseline CT chest abdomen pelvis.  However, delayed adjuvant chemotherapy may still be helpful.  Rationale and potential side effects were discussed with pt and daughter.  She would like to consider and wants to reserve chemotherapy chair time for her.  She  will call if she changes her mind. - proceed with medi port placement with Dr.Cannon- scheduled on 08/29/20.    # Anemia, hb has improved.  # morbid obesity. Recommend life style modification and healthy weight loss.  Spanish interpretor was present for the entire encounter.  Supportive care measures are necessary for patient well-being and will be provided as necessary. We spent sufficient time to discuss many aspect of care, questions were answered to patient's satisfaction.   Follow-up Earlie Server, MD, PhD Hematology Oncology [facility] at [facility] Pager- [phone] 08/13/2020

## 2020-08-14 LAB — CEA: CEA: 1.9 ng/mL (ref 0.0–4.7)

## 2020-08-15 ENCOUNTER — Telehealth: Payer: Self-pay | Admitting: *Deleted

## 2020-08-15 NOTE — Telephone Encounter (Signed)
Patient's daughter called to report that her mother had decided to go forward with chemotherapy. Daughter would like a call from the clinic to discuss the plan [phone].

## 2020-08-15 NOTE — Telephone Encounter (Signed)
Patient was scheduled for chemotherapy at previous MD appt. Lab/MD/ [facility]olf[facility] on 12/27. Did she have other questions regarding treatment?

## 2020-08-16 NOTE — Telephone Encounter (Signed)
Spoke to patient's daughter, Sherrlyn Hock, and notified that Mrs. Debra [name] is scheduled for chemotherapy on 12/27. She voiced understanding and had no further questions

## 2020-08-23 ENCOUNTER — Encounter
Admission: RE | Admit: 2020-08-23 | Discharge: 2020-08-23 | Disposition: A | Discharge status: Home / Self Care | Payer: Self-pay | Source: Ambulatory Visit | Attending: General Surgery | Admitting: General Surgery

## 2020-08-23 ENCOUNTER — Other Ambulatory Visit: Payer: Self-pay

## 2020-08-23 NOTE — Patient Instructions (Signed)
Your procedure is scheduled on: 08-29-20 [facility] Su procedimiento est programado para: Report to [facility] (1ST DESK ON RIGHT) AND THEN PROCEED TO THE SURGERY DESK ON THE 2ND FLOOR Presntese a: To find out your arrival time please call [phone] between 1PM - 3PM on 08-28-20 TUESDAY Para saber su hora de llegada por favor llame al [phone] entre la 1PM - 3PM el da:   Remember: Instructions that are not followed completely may result in serious medical risk, up to and including death,  or upon the discretion of your surgeon and anesthesiologist your surgery may need to be rescheduled.  Recuerde: Las instrucciones que no se siguen completamente Heritage manager en un riesgo de salud grave, incluyendo hasta  la [place] o a discrecin de su cirujano y Environmental health practitioner, su ciruga se puede posponer.   __X_ 1.Do not eat food after midnight the night before your procedure. No    gum chewing or hard candies. You may drink clear liquids up to 2 hours     before you are scheduled to arrive for your surgery- DO not drink clear     Liquids within 2 hours of the start of your surgery.     Clear Liquids include:    water, apple juice without pulp, clear carbohydrate drink such as     Gatorade, Black Coffee or Tea (Do not add anything to coffee or tea).      No coma nada despus de la medianoche de la noche anterior a su    procedimiento. No coma chicles ni caramelos duros. Puede tomar    lquidos claros hasta 2 horas antes de su hora programada de llegada al     hospital para su procedimiento. No tome lquidos claros durante el     transcurso de las 2 horas de su llegada programada al hospital para su     procedimiento, ya que esto puede llevar a que su procedimiento se    retrase o tenga que volver a Health and safety inspector.  Los lquidos claros incluyen:          - Agua o jugo de [place] sin pulpa          - Bebidas claras con carbohidratos como Gatorade          - Caf negro o  t claro (sin leche, sin cremas, no agregue nada al caf ni al t)  No tome nada que no est en esta lista.  Los pacientes con diabetes tipo 1 y tipo 2 solo deben Agricultural engineer.  Llame a la clnica de [facility] o a la unidad de Same Day Surgery si  tiene alguna pregunta sobre estas instrucciones.              _X__ 2.Do Not Smoke or use e-cigarettes For 24 Hours Prior to Your Surgery.    Do not use any chewable tobacco products for at least 6   hours prior to surgery.    No fume ni use cigarrillos electrnicos durante las 24 horas previas    a su [place].  No use ningn producto de tabaco masticable durante   al menos 6 horas antes de la [place].     __X_ 3. No alcohol for 24 hours before or after surgery.    No tome alcohol durante las 24 horas antes ni despus de la [place].   ____4. Bring all medications with you on the day of surgery if instructed.    Lleve todos los medicamentos con usted Games developer  de su ciruga si se le    ha indicado as.   _X___ 5. Notify your doctor if there is any change in your medical condition (cold,fever, infections).    Informe a su mdico si hay algn cambio en su condicin mdica  (resfriado, fiebre, infecciones).   Do not wear jewelry, make-up, hairpins, clips or nail polish.  No use joyas, maquillajes, pinzas/ganchos para el cabello ni esmalte de uas.  Do not wear lotions, powders, or perfumes. You may wear deodorant.  No use lociones, polvos o perfumes.  Puede usar desodorante.    Do not shave 48 hours prior to surgery. Men may shave face and neck.  No se afeite 48 horas antes de la [place].  Los hombres pueden [employer] cara  y el cuello.   Do not bring valuables to the hospital.   No lleve objetos [facility] is not responsible for any belongings or valuables.  [place] no se hace responsable de ningn tipo de pertenencias u objetos de Geographical information systems officer.               Contacts, dentures or bridgework may not be worn into  surgery.  Los lentes de [place], las dentaduras postizas o puentes no se pueden usar en la [place].   Leave your suitcase in the car. After surgery it may be brought to your room.  Deje su maleta en el auto.  Despus de la ciruga podr traerla a su habitacin.   For patients admitted to the hospital, discharge time is determined by your  treatment team.  Para los pacientes que sean ingresados al hospital, el tiempo en el cual se le  dar de alta es determinado por su equipo de [place].   Patients discharged the day of surgery will not be allowed to drive home. A los pacientes que se les da de alta el mismo da de la ciruga no se les permitir conducir a Holiday representative.   Please read over the following fact sheets that you were given: Por favor [place] informacin que le dieron:      _X___ Take these medicines the morning of surgery with A SIP OF WATER:          [employer] medicinas la maana de la ciruga con UN SORBO DE AGUA:  1.SYNTHROID (LEVOTHYROXINE)  2.   3.   4.       5.  6.  _X___ Use CHG Soap as directed          Utilice el jabn de CHG segn lo indicado    _X___ Stop Anti-inflammatories-STOP IBUPROFEN (ADVIL) NOW-ONLY TAKE TYLENOL IF NEEDED          Deje de tomar antiinflamatorios el da:   _X___ Stop supplements until after surgery            Deje de tomar suplementos hasta despus de la [place]  _

## 2020-08-27 ENCOUNTER — Other Ambulatory Visit
Admission: RE | Admit: 2020-08-27 | Discharge: 2020-08-27 | Disposition: A | Discharge status: Home / Self Care | Payer: HRSA Program | Source: Ambulatory Visit | Attending: General Surgery | Admitting: General Surgery

## 2020-08-27 ENCOUNTER — Ambulatory Visit
Admission: RE | Admit: 2020-08-27 | Discharge: 2020-08-27 | Disposition: A | Discharge status: Home / Self Care | Payer: Self-pay | Source: Ambulatory Visit | Attending: Oncology | Admitting: Oncology

## 2020-08-27 ENCOUNTER — Other Ambulatory Visit: Payer: Self-pay

## 2020-08-27 DIAGNOSIS — C187 Malignant neoplasm of sigmoid colon: Secondary | ICD-10-CM | POA: Insufficient documentation

## 2020-08-27 DIAGNOSIS — Z20822 Contact with and (suspected) exposure to covid-19: Secondary | ICD-10-CM | POA: Insufficient documentation

## 2020-08-27 DIAGNOSIS — Z01812 Encounter for preprocedural laboratory examination: Secondary | ICD-10-CM | POA: Insufficient documentation

## 2020-08-27 LAB — SARS CORONAVIRUS 2 (TAT 6-24 HRS): SARS Coronavirus 2: NEGATIVE

## 2020-08-27 MED ORDER — IOHEXOL 300 MG/ML  SOLN
100.0000 mL | Freq: Once | INTRAMUSCULAR | Status: AC | PRN
  Administered 2020-08-27: 15:00:00 100 mL via INTRAVENOUS

## 2020-08-29 ENCOUNTER — Ambulatory Visit: Payer: Self-pay | Admitting: Anesthesiology

## 2020-08-29 ENCOUNTER — Ambulatory Visit: Payer: Self-pay

## 2020-08-29 ENCOUNTER — Ambulatory Visit
Admission: RE | Admit: 2020-08-29 | Discharge: 2020-08-29 | Disposition: A | Discharge status: Home / Self Care | Payer: Self-pay | Attending: General Surgery | Admitting: General Surgery

## 2020-08-29 ENCOUNTER — Encounter
Admission: RE | Disposition: A | Discharge status: Home / Self Care | Payer: Self-pay | Source: Home / Self Care | Attending: General Surgery

## 2020-08-29 ENCOUNTER — Encounter: Payer: Self-pay | Admitting: General Surgery

## 2020-08-29 DIAGNOSIS — Z6837 Body mass index (BMI) 37.0-37.9, adult: Secondary | ICD-10-CM | POA: Insufficient documentation

## 2020-08-29 DIAGNOSIS — C187 Malignant neoplasm of sigmoid colon: Secondary | ICD-10-CM

## 2020-08-29 DIAGNOSIS — Z79890 Hormone replacement therapy (postmenopausal): Secondary | ICD-10-CM | POA: Insufficient documentation

## 2020-08-29 DIAGNOSIS — Z79899 Other long term (current) drug therapy: Secondary | ICD-10-CM | POA: Insufficient documentation

## 2020-08-29 DIAGNOSIS — Z452 Encounter for adjustment and management of vascular access device: Secondary | ICD-10-CM

## 2020-08-29 DIAGNOSIS — E669 Obesity, unspecified: Secondary | ICD-10-CM | POA: Insufficient documentation

## 2020-08-29 DIAGNOSIS — Z9049 Acquired absence of other specified parts of digestive tract: Secondary | ICD-10-CM | POA: Insufficient documentation

## 2020-08-29 DIAGNOSIS — C189 Malignant neoplasm of colon, unspecified: Secondary | ICD-10-CM | POA: Insufficient documentation

## 2020-08-29 HISTORY — PX: PORTACATH PLACEMENT: SHX2246

## 2020-08-29 SURGERY — INSERTION, TUNNELED CENTRAL VENOUS DEVICE, WITH PORT
Anesthesia: General

## 2020-08-29 MED ORDER — ORAL CARE MOUTH RINSE: 15.0000 mL | Freq: Once | OROMUCOSAL | Status: AC

## 2020-08-29 MED ORDER — BUPIVACAINE HCL (PF) 0.25 % IJ SOLN
INTRAMUSCULAR | Status: AC
  Filled 2020-08-29: qty 30

## 2020-08-29 MED ORDER — FENTANYL CITRATE (PF) 100 MCG/2ML IJ SOLN
25.0000 ug | INTRAMUSCULAR | Status: DC | PRN
Start: 2020-08-29 — End: 2020-08-29

## 2020-08-29 MED ORDER — MIDAZOLAM HCL 2 MG/2ML IJ SOLN
INTRAMUSCULAR | Status: AC
  Filled 2020-08-29: qty 2

## 2020-08-29 MED ORDER — LACTATED RINGERS IV SOLN: INTRAVENOUS | Status: DC

## 2020-08-29 MED ORDER — ONDANSETRON HCL 4 MG/2ML IJ SOLN: 4.0000 mg | Freq: Once | INTRAMUSCULAR | Status: DC | PRN

## 2020-08-29 MED ORDER — FENTANYL CITRATE (PF) 100 MCG/2ML IJ SOLN
INTRAMUSCULAR | Status: AC
  Administered 2020-08-29: 09:00:00 25 ug via INTRAVENOUS
  Filled 2020-08-29: qty 2

## 2020-08-29 MED ORDER — ONDANSETRON HCL 4 MG/2ML IJ SOLN
INTRAMUSCULAR | Status: DC | PRN
  Administered 2020-08-29: 4 mg via INTRAVENOUS

## 2020-08-29 MED ORDER — CEFAZOLIN SODIUM-DEXTROSE 2-4 GM/100ML-% IV SOLN
2.0000 g | INTRAVENOUS | Status: AC
  Administered 2020-08-29: 08:00:00 2 g via INTRAVENOUS

## 2020-08-29 MED ORDER — PROPOFOL 500 MG/50ML IV EMUL
INTRAVENOUS | Status: AC
  Filled 2020-08-29: qty 150

## 2020-08-29 MED ORDER — SODIUM CHLORIDE 0.9 % IV SOLN: INTRAVENOUS | Status: DC | PRN

## 2020-08-29 MED ORDER — DEXAMETHASONE SODIUM PHOSPHATE 10 MG/ML IJ SOLN
INTRAMUSCULAR | Status: DC | PRN
  Administered 2020-08-29: 10 mg via INTRAVENOUS

## 2020-08-29 MED ORDER — CHLORHEXIDINE GLUCONATE CLOTH 2 % EX PADS: 6.0000 | MEDICATED_PAD | Freq: Once | CUTANEOUS | Status: DC

## 2020-08-29 MED ORDER — DEXMEDETOMIDINE (PRECEDEX) IN NS 20 MCG/5ML (4 MCG/ML) IV SYRINGE
PREFILLED_SYRINGE | INTRAVENOUS | Status: DC | PRN
  Administered 2020-08-29 (×2): 4 ug via INTRAVENOUS
  Administered 2020-08-29: 8 ug via INTRAVENOUS

## 2020-08-29 MED ORDER — CEFAZOLIN SODIUM-DEXTROSE 2-4 GM/100ML-% IV SOLN
INTRAVENOUS | Status: AC
  Filled 2020-08-29: qty 100

## 2020-08-29 MED ORDER — FAMOTIDINE 20 MG PO TABS
ORAL_TABLET | ORAL | Status: AC
  Filled 2020-08-29: qty 1

## 2020-08-29 MED ORDER — SODIUM CHLORIDE 0.9 % IV SOLN
INTRAVENOUS | Status: DC | PRN
  Administered 2020-08-29: 08:00:00 15 ug/min via INTRAVENOUS

## 2020-08-29 MED ORDER — FAMOTIDINE 20 MG PO TABS: 20.0000 mg | ORAL_TABLET | Freq: Once | ORAL | Status: AC

## 2020-08-29 MED ORDER — MIDAZOLAM HCL 2 MG/2ML IJ SOLN
INTRAMUSCULAR | Status: DC | PRN
  Administered 2020-08-29 (×4): .5 mg via INTRAVENOUS

## 2020-08-29 MED ORDER — ACETAMINOPHEN 500 MG PO TABS
ORAL_TABLET | ORAL | Status: AC
  Administered 2020-08-29: 07:00:00 1000 mg via ORAL
  Filled 2020-08-29: qty 2

## 2020-08-29 MED ORDER — LIDOCAINE-EPINEPHRINE 1 %-1:100000 IJ SOLN
INTRAMUSCULAR | Status: DC | PRN
  Administered 2020-08-29: 10 mL via SUBCUTANEOUS

## 2020-08-29 MED ORDER — FENTANYL CITRATE (PF) 100 MCG/2ML IJ SOLN
INTRAMUSCULAR | Status: AC
  Filled 2020-08-29: qty 2

## 2020-08-29 MED ORDER — PHENYLEPHRINE HCL (PRESSORS) 10 MG/ML IV SOLN
INTRAVENOUS | Status: DC | PRN
  Administered 2020-08-29 (×3): 100 ug via INTRAVENOUS

## 2020-08-29 MED ORDER — ACETAMINOPHEN 500 MG PO TABS: 1000.0000 mg | ORAL_TABLET | ORAL | Status: AC

## 2020-08-29 MED ORDER — PROPOFOL 500 MG/50ML IV EMUL
INTRAVENOUS | Status: DC | PRN
  Administered 2020-08-29: 100 ug/kg/min via INTRAVENOUS

## 2020-08-29 MED ORDER — CHLORHEXIDINE GLUCONATE 0.12 % MT SOLN
OROMUCOSAL | Status: AC
  Administered 2020-08-29: 07:00:00 15 mL via OROMUCOSAL
  Filled 2020-08-29: qty 15

## 2020-08-29 MED ORDER — FENTANYL CITRATE (PF) 100 MCG/2ML IJ SOLN
INTRAMUSCULAR | Status: DC | PRN
  Administered 2020-08-29 (×2): 25 ug via INTRAVENOUS

## 2020-08-29 MED ORDER — GLYCOPYRROLATE 0.2 MG/ML IJ SOLN
INTRAMUSCULAR | Status: DC | PRN
  Administered 2020-08-29: .2 mg via INTRAVENOUS

## 2020-08-29 MED ORDER — BUPIVACAINE LIPOSOME 1.3 % IJ SUSP: 20.0000 mL | Freq: Once | INTRAMUSCULAR | Status: DC

## 2020-08-29 MED ORDER — FAMOTIDINE 20 MG PO TABS
ORAL_TABLET | ORAL | Status: AC
  Administered 2020-08-29: 07:00:00 20 mg via ORAL
  Filled 2020-08-29: qty 1

## 2020-08-29 MED ORDER — CHLORHEXIDINE GLUCONATE 0.12 % MT SOLN: 15.0000 mL | Freq: Once | OROMUCOSAL | Status: AC

## 2020-08-29 MED ORDER — HEPARIN SODIUM (PORCINE) 5000 UNIT/ML IJ SOLN
INTRAMUSCULAR | Status: AC
  Filled 2020-08-29: qty 1

## 2020-08-29 MED ORDER — MEPERIDINE HCL 50 MG/ML IJ SOLN: 6.2500 mg | INTRAMUSCULAR | Status: DC | PRN

## 2020-08-29 SURGICAL SUPPLY — 37 items
ADH SKN CLS APL DERMABOND .7 (GAUZE/BANDAGES/DRESSINGS) ×1
APL PRP STRL LF DISP 70% ISPRP (MISCELLANEOUS) ×1
BAG DECANTER FOR FLEXI CONT (MISCELLANEOUS) ×2 IMPLANT
BLADE SURG 15 STRL LF DISP TIS (BLADE) ×1 IMPLANT
BLADE SURG 15 STRL SS (BLADE) ×2
BLADE SURG SZ11 CARB STEEL (BLADE) ×2 IMPLANT
BOOT SUTURE AID YELLOW STND (SUTURE) ×2 IMPLANT
CHLORAPREP W/TINT 26 (MISCELLANEOUS) ×2 IMPLANT
COVER WAND RF STERILE (DRAPES) ×2 IMPLANT
DERMABOND ADVANCED (GAUZE/BANDAGES/DRESSINGS) ×1
DERMABOND ADVANCED .7 DNX12 (GAUZE/BANDAGES/DRESSINGS) ×1 IMPLANT
DRAPE C-ARM XRAY 36X54 (DRAPES) ×2 IMPLANT
ELECT CAUTERY BLADE 6.4 (BLADE) ×2 IMPLANT
ELECT REM PT RETURN 9FT ADLT (ELECTROSURGICAL) ×2
ELECTRODE REM PT RTRN 9FT ADLT (ELECTROSURGICAL) ×1 IMPLANT
GLOVE BIO SURGEON STRL SZ 6.5 (GLOVE) ×2 IMPLANT
GLOVE INDICATOR 7.0 STRL GRN (GLOVE) ×4 IMPLANT
GOWN STRL REUS W/ TWL LRG LVL3 (GOWN DISPOSABLE) ×2 IMPLANT
GOWN STRL REUS W/TWL LRG LVL3 (GOWN DISPOSABLE) ×4
IV NS 500ML (IV SOLUTION) ×2
IV NS 500ML BAXH (IV SOLUTION) ×1 IMPLANT
KIT PORT POWER 8FR ISP CVUE (Port) ×2 IMPLANT
MANIFOLD NEPTUNE II (INSTRUMENTS) ×2 IMPLANT
NEEDLE FILTER BLUNT 18X 1/2SAF (NEEDLE) ×1
NEEDLE FILTER BLUNT 18X1 1/2 (NEEDLE) ×1 IMPLANT
NEEDLE HYPO 22GX1.5 SAFETY (NEEDLE) ×2 IMPLANT
PACK PORT-A-CATH (MISCELLANEOUS) ×2 IMPLANT
STRIP CLOSURE SKIN 1/2X4 (GAUZE/BANDAGES/DRESSINGS) ×2 IMPLANT
SUT MNCRL 4-0 (SUTURE) ×2
SUT MNCRL 4-0 27XMFL (SUTURE) ×1
SUT PROLENE 2 0 SH DA (SUTURE) ×4 IMPLANT
SUT VIC AB 3-0 SH 27 (SUTURE) ×2
SUT VIC AB 3-0 SH 27X BRD (SUTURE) ×1 IMPLANT
SUTURE MNCRL 4-0 27XMF (SUTURE) ×1 IMPLANT
SYR 10ML LL (SYRINGE) ×2 IMPLANT
SYR 20ML LL LF (SYRINGE) ×2 IMPLANT
SYR 3ML LL SCALE MARK (SYRINGE) ×2 IMPLANT

## 2020-08-29 NOTE — Interval H&P Note (Signed)
History and Physical Interval Note:  08/29/2020 7:19 AM  Debra [name]  has presented today for surgery, with the diagnosis of Colon cancer.  The various methods of treatment have been discussed with the patient and family. After consideration of risks, benefits and other options for treatment, the patient has consented to  Procedure(s): INSERTION PORT-A-CATH, chemotherapy (N/A) as a surgical intervention.  The patient's history has been reviewed, patient examined, no change in status, stable for surgery.  I have reviewed the patient's chart and labs.  Questions were answered to the patient's satisfaction.     [name]

## 2020-08-29 NOTE — Anesthesia Postprocedure Evaluation (Signed)
Anesthesia Post Note  Patient: Debra [name]  Procedure(s) Performed: INSERTION PORT-A-CATH, chemotherapy (N/A )  Patient location during evaluation: PACU Anesthesia Type: General Level of consciousness: awake and alert, awake and oriented Pain management: pain level controlled Vital Signs Assessment: post-procedure vital signs reviewed and stable Respiratory status: spontaneous breathing, nonlabored ventilation and respiratory function stable Cardiovascular status: blood pressure returned to baseline and stable Postop Assessment: no apparent nausea or vomiting Anesthetic complications: no   No complications documented.   Last Vitals:  Vitals:   08/29/20 0936 08/29/20 0959  BP: (!) 121/52 (!) 103/58  Pulse: 65 (!) 59  Resp: 14 16  Temp: (!) 36.3 C   SpO2: 96% 99%    Last Pain:  Vitals:   08/29/20 0936  TempSrc: Temporal  PainSc: 1                  Phill Mutter

## 2020-08-29 NOTE — Op Note (Signed)
°  Pre-operative Diagnosis:  Post-operative Diagnosis:    Surgeon: [name], MD  Anesthesia: IV sedation, bupivicaine 0.25% with epinephrine and lidocaine 1% without epinephrine  Procedure:  right IJ Port placement with fluoroscopy under U/S guidance  Findings: Needle and wire visualized within target vessel by ultrasound Good position of the tip of the catheter by fluoroscopy  Estimated Blood Loss: Minimal         Drains: None         Specimens: None       Complications: none immediately apparent.         Procedure Details  The patient was seen again in the preoperative holding area. The benefits, complications, treatment options, and expected outcomes were discussed with the patient. The risks of bleeding, infection, recurrence of symptoms, failure to resolve symptoms, thrombosis, nonfunction, breakage, pneumothorax, and/or hemopneumothorax, any of which could require chest tube or further surgery were reviewed with the patient. The patient agreed to accept these risks. The patient was taken to the operating room, identified as Debra [name] and the procedure verified. A time out was held and the above information confirmed.  Prior to the induction of general anesthesia, antibiotic prophylaxis was administered. VTE prophylaxis was in place. Appropriate anesthesia was then administered and tolerated well. The chest was prepped with Chloraprep and draped in standard sterile fashion. The patient was positioned in the supine position. Then the patient was placed in Trendelenburg position.  Local anesthetic was infiltrated into the skin and subcutaneous tissues in the neck and anterior chest wall. A large bore needle was placed into the internal jugular vein under U/S guidance without difficulty and then the Seldinger wire was advanced. Fluoroscopy was utilized to confirm that the Seldinger wire was in the superior vena cava.  An incision was made and a port pocket developed  with blunt and electrocautery dissection. The introducer dilator was placed over the Seldinger wire and the wire was removed. The previously flushed catheter was placed into the introducer dilator and the peel-away sheath was removed. The catheter length was confirmed and trimmed utilizing fluoroscopy for proper positioning. The catheter was then attached to the previously flushed port. The port was placed into the pocket. The port was secured in situ with 2-0 Prolene sutures, flushed to confirm function and heparin locked.  The wound was closed with interrupted 3-0 Vicryl followed by 4-0 subcuticular Monocryl sutures. Dermabond was applied, followed by Steri-Strips.  The patient was taken to the recovery room in stable condition where a postoperative chest film has been ordered.

## 2020-08-29 NOTE — Discharge Instructions (Signed)
Anestesia general en adultos, cuidados posteriores General Anesthesia, Adult, Care After Lea esta informacin sobre cmo cuidarse despus del procedimiento. El mdico tambin podr darle instrucciones ms especficas. Comunquese con su mdico si tiene problemas o preguntas. Qu puedo esperar despus del procedimiento? Luego del procedimiento, son comunes los siguiente efectos secundarios:  Dolor o molestias en el lugar de la va intravenosa (i.v.).  Nuseas.  Vmitos.  Dolor de garganta.  Dificultad para concentrarse.  Sentir fro o tener escalofros.  Debilidad o cansancio.  Somnolencia y fatiga.  Malestar y dolor corporal. Estos efectos secundarios pueden afectar partes del cuerpo que no estuvieron involucradas en la ciruga. Siga estas indicaciones en su casa:  Durante al menos 24horas despus del procedimiento:  Pdale a un adulto responsable que permanezca con usted. Es importante que alguien cuide de usted hasta que se despierte y est alerta.  Descanse todo lo que sea necesario.  No haga lo siguiente: ? Participar en actividades en las que podra caerse o lastimarse. ? Conducir. ? Operar maquinarias pesadas. ? Beber alcohol. ? Tomar somnferos o medicamentos que causen somnolencia. ? Firmar documentos legales ni tomar decisiones importantes. ? Cuidar a nios por su cuenta. Qu debe comer y beber  Siga las indicaciones del mdico respecto de las restricciones de comidas o bebidas.  Cuando tenga hambre, comience a comer cantidades pequeas de alimentos que sean blandos y fciles de digerir (livianos), como una tostada. Retome su dieta habitual de forma gradual.  Beba suficiente lquido como para mantener la orina de color amarillo plido.  Si vomita, rehidrtese tomando agua, jugo o caldo transparente. Instrucciones generales  Si tiene apnea del sueo, la ciruga y ciertos medicamentos pueden aumentar el riesgo de problemas respiratorios. Siga las  indicaciones del mdico respecto al uso de su dispositivo para dormir: ? Siempre que duerma, incluso durante las siestas que tome en el da. ? Mientras tome analgsicos recetados, medicamentos para dormir o medicamentos que producen somnolencia.  Reanude sus actividades normales segn lo indicado por el mdico. Pregntele al mdico qu actividades son seguras para usted.  Tome los medicamentos de venta libre y los recetados solamente como se lo haya indicado el mdico.  Si fuma, no lo haga sin supervisin.  Concurra a todas las visitas de seguimiento como se lo haya indicado el mdico. Esto es importante. Comunquese con un mdico si:  Tiene nuseas o vmitos que no mejoran con medicamentos.  No puede comer ni beber sin vomitar.  El dolor no se alivia con medicamentos.  No puede orinar.  Tiene una erupcin cutnea.  Tiene fiebre.  Presenta enrojecimiento alrededor del lugar de la va intravenosa (i.v.) que empeora. Solicite ayuda de inmediato si:  Tiene dificultad para respirar.  Siente dolor en el pecho.  Observa sangre en la orina o heces, o vomita sangre. Resumen  Despus del procedimiento, es comn tener dolor de garganta y nuseas. Tambin es comn sentirse cansado.  Pdale a un adulto responsable que permanezca con usted durante 24 horas despus de la anestesia general. Es importante que alguien cuide de usted hasta que se despierte y est alerta.  Cuando tenga hambre, comience a comer cantidades pequeas de alimentos que sean blandos y fciles de digerir (livianos), como una tostada. Retome su dieta habitual de forma gradual.  Beba suficiente lquido como para mantener la orina de color amarillo plido.  Reanude sus actividades normales segn lo indicado por el mdico. Pregntele al mdico qu actividades son seguras para usted. Esta informacin no tiene como fin reemplazar   el consejo del mdico. Asegrese de hacerle al mdico cualquier pregunta que tenga. Document  Revised: 06/22/2017 Document Reviewed: 06/22/2017 Elsevier Patient Education  2020 Elsevier Inc.  

## 2020-08-29 NOTE — Progress Notes (Signed)
   08/29/20 0826  Clinical Encounter Type  Visited With Family  Visit Type Initial  Referral From Chaplain  Consult/Referral To [place] briefly visited with Pt's daughter to find out how she was doing and she said she was fine.

## 2020-08-29 NOTE — Anesthesia Preprocedure Evaluation (Signed)
Anesthesia Evaluation  Patient identified by MRN, date of birth, ID band Patient awake    Reviewed: Allergy & Precautions, NPO status , Patient's Chart, lab work & pertinent test results  History of Anesthesia Complications Negative for: history of anesthetic complications  Airway Mallampati: III  TM Distance: >3 FB Neck ROM: Full    Dental  (+) Implants   Pulmonary neg pulmonary ROS, neg sleep apnea, neg COPD,    Pulmonary exam normal breath sounds clear to auscultation- rhonchi (-) wheezing      Cardiovascular (-) hypertension(-) CAD, (-) Past MI, (-) Cardiac Stents and (-) CABG negative cardio ROS Normal cardiovascular exam Rhythm:Regular Rate:Normal - Systolic murmurs and - Diastolic murmurs    Neuro/Psych neg Seizures negative neurological ROS  negative psych ROS   GI/Hepatic negative GI ROS, Neg liver ROS,   Endo/Other  neg diabetesHypothyroidism   Renal/GU negative Renal ROS     Musculoskeletal negative musculoskeletal ROS (+)   Abdominal (+) + obese,   Peds  Hematology  (+) anemia ,   Anesthesia Other Findings Past Medical History: No date: Anemia No date: Hypothyroidism No date: Thyroid disease   Reproductive/Obstetrics                             Anesthesia Physical  Anesthesia Plan  ASA: II  Anesthesia Plan: General   Post-op Pain Management:    Induction: Intravenous  PONV Risk Score and Plan: 2 and Propofol infusion  Airway Management Planned: Natural Airway  Additional Equipment:   Intra-op Plan:   Post-operative Plan:   Informed Consent: I have reviewed the patients History and Physical, chart, labs and discussed the procedure including the risks, benefits and alternatives for the proposed anesthesia with the patient or authorized representative who has indicated his/her understanding and acceptance.     Dental advisory given  Plan Discussed with:  CRNA and Anesthesiologist  Anesthesia Plan Comments:         Anesthesia Quick Evaluation

## 2020-08-29 NOTE — Transfer of Care (Signed)
Immediate Anesthesia Transfer of Care Note  Patient: Debra [name]  Procedure(s) Performed: INSERTION PORT-A-CATH, chemotherapy (N/A )  Patient Location: PACU  Anesthesia Type:General  Level of Consciousness: drowsy and patient cooperative  Airway & Oxygen Therapy: Patient Spontanous Breathing and Patient connected to face mask oxygen  Post-op Assessment: Report given to RN and Post -op Vital signs reviewed and stable  Post vital signs: Reviewed and stable  Last Vitals:  Vitals Value Taken Time  BP 95/47 08/29/20 0837  Temp 36.3 C 08/29/20 0836  Pulse 64 08/29/20 0839  Resp 15 08/29/20 0839  SpO2 100 % 08/29/20 0839  Vitals shown include unvalidated device data.  Last Pain:  Vitals:   08/29/20 0836  TempSrc:   PainSc: Asleep         Complications: No complications documented.

## 2020-08-30 ENCOUNTER — Encounter: Payer: Self-pay | Admitting: General Surgery

## 2020-09-03 ENCOUNTER — Inpatient Hospital Stay: Payer: Self-pay

## 2020-09-03 ENCOUNTER — Inpatient Hospital Stay (HOSPITAL_BASED_OUTPATIENT_CLINIC_OR_DEPARTMENT_OTHER): Payer: Self-pay | Admitting: Oncology

## 2020-09-03 ENCOUNTER — Encounter: Payer: Self-pay | Admitting: Oncology

## 2020-09-03 VITALS — BP 126/77 | HR 59 | Temp 98.4°F | Resp 18 | Wt 190.2 lb

## 2020-09-03 DIAGNOSIS — C187 Malignant neoplasm of sigmoid colon: Secondary | ICD-10-CM

## 2020-09-03 DIAGNOSIS — D500 Iron deficiency anemia secondary to blood loss (chronic): Secondary | ICD-10-CM

## 2020-09-03 DIAGNOSIS — Z5111 Encounter for antineoplastic chemotherapy: Secondary | ICD-10-CM | POA: Insufficient documentation

## 2020-09-03 LAB — COMPREHENSIVE METABOLIC PANEL
ALT: 18 U/L (ref 0–44)
AST: 22 U/L (ref 15–41)
Albumin: 3.8 g/dL (ref 3.5–5.0)
Alkaline Phosphatase: 88 U/L (ref 38–126)
Anion gap: 8 (ref 5–15)
BUN: 12 mg/dL (ref 6–20)
CO2: 25 mmol/L (ref 22–32)
Calcium: 8.9 mg/dL (ref 8.9–10.3)
Chloride: 102 mmol/L (ref 98–111)
Creatinine, Ser: 0.42 mg/dL — ABNORMAL LOW (ref 0.44–1.00)
GFR, Estimated: >60 mL/min (ref >60)
Glucose, Bld: 126 mg/dL — ABNORMAL HIGH (ref 70–99)
Potassium: 3.6 mmol/L (ref 3.5–5.1)
Sodium: 135 mmol/L (ref 135–145)
Total Bilirubin: 0.5 mg/dL (ref 0.3–1.2)
Total Protein: 7.3 g/dL (ref 6.5–8.1)

## 2020-09-03 LAB — CBC WITH DIFFERENTIAL/PLATELET
Abs Immature Granulocytes: 0.01 10*3/uL (ref 0.00–0.07)
Basophils Absolute: 0 10*3/uL (ref 0.0–0.1)
Basophils Relative: 0 %
Eosinophils Absolute: 0.1 10*3/uL (ref 0.0–0.5)
Eosinophils Relative: 2 %
HCT: 34.1 % — ABNORMAL LOW (ref 36.0–46.0)
Hemoglobin: 11.6 g/dL — ABNORMAL LOW (ref 12.0–15.0)
Immature Granulocytes: 0 %
Lymphocytes Relative: 28 %
Lymphs Abs: 1.6 10*3/uL (ref 0.7–4.0)
MCH: 28.9 pg (ref 26.0–34.0)
MCHC: 34 g/dL (ref 30.0–36.0)
MCV: 85 fL (ref 80.0–100.0)
Monocytes Absolute: 0.5 10*3/uL (ref 0.1–1.0)
Monocytes Relative: 9 %
Neutro Abs: 3.4 10*3/uL (ref 1.7–7.7)
Neutrophils Relative %: 61 %
Platelets: 139 10*3/uL — ABNORMAL LOW (ref 150–400)
RBC: 4.01 MIL/uL (ref 3.87–5.11)
RDW: 13.8 % (ref 11.5–15.5)
WBC: 5.6 10*3/uL (ref 4.0–10.5)
nRBC: 0 % (ref 0.0–0.2)

## 2020-09-03 MED ORDER — PALONOSETRON HCL INJECTION 0.25 MG/5ML
0.2500 mg | Freq: Once | INTRAVENOUS | Status: AC
  Administered 2020-09-03: 11:00:00 0.25 mg via INTRAVENOUS
  Filled 2020-09-03: qty 5

## 2020-09-03 MED ORDER — OXALIPLATIN CHEMO INJECTION 100 MG/20ML
87.0000 mg/m2 | Freq: Once | INTRAVENOUS | Status: AC
  Administered 2020-09-03: 11:00:00 160 mg via INTRAVENOUS
  Filled 2020-09-03: qty 32

## 2020-09-03 MED ORDER — SODIUM CHLORIDE 0.9 % IV SOLN
10.0000 mg | Freq: Once | INTRAVENOUS | Status: AC
  Administered 2020-09-03: 11:00:00 10 mg via INTRAVENOUS
  Filled 2020-09-03: qty 10

## 2020-09-03 MED ORDER — FLUOROURACIL CHEMO INJECTION 2.5 GM/50ML
400.0000 mg/m2 | Freq: Once | INTRAVENOUS | Status: AC
  Administered 2020-09-03: 13:00:00 750 mg via INTRAVENOUS
  Filled 2020-09-03: qty 15

## 2020-09-03 MED ORDER — SODIUM CHLORIDE 0.9 % IV SOLN
2400.0000 mg/m2 | INTRAVENOUS | Status: DC
  Administered 2020-09-03: 13:00:00 4400 mg via INTRAVENOUS
  Filled 2020-09-03: qty 88

## 2020-09-03 MED ORDER — SODIUM CHLORIDE 0.9% FLUSH
10.0000 mL | INTRAVENOUS | Status: DC | PRN
  Administered 2020-09-03: 09:00:00 10 mL via INTRAVENOUS
  Filled 2020-09-03: qty 10

## 2020-09-03 MED ORDER — LEUCOVORIN CALCIUM INJECTION 350 MG
410.0000 mg/m2 | Freq: Once | INTRAVENOUS | Status: AC
  Administered 2020-09-03: 11:00:00 750 mg via INTRAVENOUS
  Filled 2020-09-03: qty 37.5

## 2020-09-03 MED ORDER — DEXTROSE 5 % IV SOLN
Freq: Once | INTRAVENOUS | Status: AC
  Filled 2020-09-03: qty 250

## 2020-09-03 NOTE — Progress Notes (Signed)
Pt tolerated infusion well. Chemo education preformed and home infusion pump reviewed. Pt verbalizes understanding. Reviewed instructions with patient's daughter, daughter verbalizes understanding.  No s/s of distress or reaction noted. Pt stable at discharge.

## 2020-09-03 NOTE — Progress Notes (Signed)
Patient here for follow up. Reports low back pain 8/10. Also reports occasional numbness and discoloration to tips of fingers.

## 2020-09-03 NOTE — Progress Notes (Signed)
Hematology/Oncology Progress Note [facility] [phone] [phone]  Patient Care Team: [name], MD as PCP - General (Family Medicine) [name], RN as Registered Nurse [name], RN as Registered Nurse   Name of the patient: Debra [name]  [id number]  03-04-1966  Date of visit: 09/03/20  PERTINENT Tutwiler Alliana Mcauliff is a 54 y.o.afemale who has above oncology history reviewed by me today presented for follow up visit for management of Stage IIIB Colon cancer seen by me on 11/21/2019 during her hospitalization. Patient has a history of Covid positivity. Severe iron deficiency status post IV Venofer treatment. Colonoscopy 11/21/2019 showed a malignant appearing severe stenosis found in the sigmoid colon at 25 cm proximal to the anus and was nontransversed.  Biopsy was taken.  Surgical pathology is pending  Nonbleeding external hemorrhoids Biopsy was not diagnostic 01/03/2020 virtual colonoscopy showed long segment area of wall thickening and annular constriction within the sigmoid colon in the area of previous concern most compatible with annular malignancy.  Remainder of the colon evaluation is somewhat limited due to large amount of retained stool and incomplete distention proximal to the annular constricting lesion. 01/30/2020 Patient underwent left hemicolectomy by Dr. Celine Ahr and a biopsy showed invasive adenocarcinoma, poorly differentiated.  37 lymph nodes negative for malignancy.  Grade 3, all margins are uninvolved.  Perineural invasion present.  Tumor deposit present. pT3 pN1c cM0 Checks x-ray did not show any lung mass. #Postop, patient developed postop ileus and she required TPN, PICC and NG tube placement.  Postoperation day 9 she continued to have increased leukocytosis and was subsequently found to have a midline wound infection to the inferior portion of the wound.  Patient was treated with  antibiotics.  07/03/2020 CT abdomen pelvis w contrast showed well positioned drainage catheter with apparent resolution of the intra-abdominal abscess. 07/10/2020 seen by [facility] and patient had drainage catheter removed by IR on 07/12/2020   INTERVAL HISTORY Lenae Otis Burress is a 54 y.o. female who has above history reviewed by me today presents for follow up visit for management of stage IIIb colon cancer Problems and complaints are listed below:  During interval patient has had Mediport placed.  Also had CT scan for restaging prior to adjuvant chemotherapy. She has no new complaints today.  Review of systems- Review of Systems  Constitutional: Negative for appetite change, chills, fatigue, fever and unexpected weight change.  HENT:   Negative for hearing loss and voice change.   Eyes: Negative for eye problems.  Respiratory: Negative for chest tightness, cough and shortness of breath.   Cardiovascular: Negative for chest pain.  Gastrointestinal: Negative for abdominal distention, abdominal pain, blood in stool and constipation.  Endocrine: Negative for hot flashes.  Genitourinary: Negative for difficulty urinating, dysuria and frequency.   Musculoskeletal: Negative for arthralgias.  Skin: Negative for itching, rash and wound.  Neurological: Negative for extremity weakness, headaches and light-headedness.  Hematological: Negative for adenopathy.  Psychiatric/Behavioral: Negative for confusion.    No Known Allergies  Patient Active Problem List   Diagnosis Date Noted  . Encounter for central line placement   . Loss of weight 02/15/2020  . Goals of care, counseling/discussion 02/15/2020  . Malignant neoplasm of sigmoid colon ([place]) 02/15/2020  . Pressure injury of skin 02/03/2020  . Bowel obstruction ([place]) 01/29/2020  . Iron deficiency anemia due to chronic blood loss 11/22/2019  . COVID-19 virus detected 11/20/2019  . Symptomatic anemia 11/20/2019  . Hypothyroidism  11/20/2019  . Iron deficiency anemia 11/20/2019  . GIB (gastrointestinal bleeding) 11/20/2019     Past Medical History:  Diagnosis Date  . Anemia   . Colon cancer ([place]) 04/2020  . Hypothyroidism   . Thyroid disease      Past Surgical History:  Procedure Laterality Date  . COLONOSCOPY WITH PROPOFOL N/A 11/21/2019   Procedure: COLONOSCOPY WITH PROPOFOL;  Surgeon: Lin Landsman, MD;  Location: [facility] ENDOSCOPY;  Service: Gastroenterology;  Laterality: N/A;  . CYSTOSCOPY WITH STENT PLACEMENT Left 01/30/2020   Procedure: CYSTOSCOPY WITH STENT PLACEMENT;  Surgeon: Hollice Espy, MD;  Location: [facility] ORS;  Service: Urology;  Laterality: Left;  . ESOPHAGOGASTRODUODENOSCOPY (EGD) WITH PROPOFOL N/A 11/21/2019   Procedure: ESOPHAGOGASTRODUODENOSCOPY (EGD) WITH PROPOFOL;  Surgeon: Lin Landsman, MD;  Location: [facility] ENDOSCOPY;  Service: Gastroenterology;  Laterality: N/A;  . IR RADIOLOGIST EVAL & MGMT  07/03/2020  . IR RADIOLOGIST EVAL & MGMT  07/12/2020  . PARTIAL COLECTOMY N/A 01/30/2020   Procedure: PARTIAL COLECTOMY Sigmoid;  Surgeon: [name], MD;  Location: [place] ORS;  Service: General;  Laterality: N/A;  . PORTACATH PLACEMENT Left 03/28/2020   Procedure: INSERTION PORT-A-CATH;  Surgeon: [name], MD;  Location: [facility] ORS;  Service: General;  Laterality: Left;  . PORTACATH PLACEMENT N/A 08/29/2020   Procedure: INSERTION PORT-A-CATH, chemotherapy;  Surgeon: [name], MD;  Location: [facility] ORS;  Service: General;  Laterality: N/A;    Social History   Socioeconomic History  . Marital status: Single    Spouse name: Not on file  . Number of children: Not on file  . Years of education: Not on file  . Highest education level: Not on file  Occupational History  . Occupation: Factory  Tobacco Use  . Smoking status: Never Smoker  . Smokeless tobacco: Never Used  Vaping Use  . Vaping Use: Never used  Substance and Sexual Activity  . Alcohol use: Never  . Drug  use: Never  . Sexual activity: Not on file  Other Topics Concern  . Not on file  Social History Narrative   Lives at home with significant other   Social Determinants of Health   Financial Resource Strain: Not on file  Food Insecurity: Not on file  Transportation Needs: Not on file  Physical Activity: Not on file  Stress: Not on file  Social Connections: Not on file  Intimate Partner Violence: Not on file     Family History  Problem Relation Age of Onset  . Diabetes Sister      Current Outpatient Medications:  .  B-D 3CC LUER-LOK SYR 25GX1" 25G X 1" 3 ML MISC, Use with B12 injection, Disp: , Rfl:  .  cyanocobalamin (,VITAMIN B-12,) 1000 MCG/ML injection, Inject 1,000 mcg into the muscle every 30 (thirty) days., Disp: , Rfl:  .  ibuprofen (ADVIL) 800 MG tablet, Take 1 tablet (800 mg total) by mouth every 8 (eight) hours as needed., Disp: 30 tablet, Rfl: 0 .  levothyroxine (SYNTHROID) 137 MCG tablet, Take 137 mcg by mouth daily before breakfast. , Disp: , Rfl:  .  ondansetron (ZOFRAN) 8 MG tablet, Take 1 tablet (8 mg total) by mouth 2 (two) times daily as needed for refractory nausea / vomiting. Start on day 3 after chemotherapy., Disp: 30 tablet, Rfl: 1 .  prochlorperazine (COMPAZINE) 10 MG tablet, Take 1 tablet (10 mg total) by mouth every 6 (six) hours as needed (Nausea or vomiting)., Disp: 30 tablet, Rfl: 1 .  docusate sodium (COLACE) 100 MG capsule,  Take 1 capsule (100 mg total) by mouth daily. (Patient not taking: No sig reported), Disp: 30 capsule, Rfl: 1 .  ferrous sulfate 325 (65 FE) MG tablet, Take 1 tablet (325 mg total) by mouth 2 (two) times daily with a meal. (Patient not taking: No sig reported), Disp: 60 tablet, Rfl: 3 .  lidocaine-prilocaine (EMLA) cream, Apply to affected area once (Patient not taking: No sig reported), Disp: 30 g, Rfl: 3 No current facility-administered medications for this visit.  Facility-Administered Medications Ordered in Other Visits:  .   fluorouracil (ADRUCIL) 4,400 mg in sodium chloride 0.9 % 62 mL chemo infusion, 2,400 mg/m2 (Treatment Plan Recorded), Intravenous, 1 day or 1 dose, Earlie Server, MD, 4,400 mg at 09/03/20 1326 .  sodium chloride flush (NS) 0.9 % injection 10 mL, 10 mL, Intravenous, PRN, Earlie Server, MD, 10 mL at 09/03/20 0918   Physical exam:  Vitals:   09/03/20 1002  BP: 126/77  Pulse: (!) 59  Resp: 18  Temp: 98.4 F (36.9 C)  Weight: 190 lb 3.2 oz (86.3 kg)   Physical Exam Constitutional:      General: She is not in acute distress.    Appearance: She is obese.     Comments: Patient walks independently  HENT:     Head: Normocephalic and atraumatic.     Mouth/Throat:     Pharynx: No oropharyngeal exudate.  Eyes:     General: No scleral icterus.    Pupils: Pupils are equal, round, and reactive to light.  Cardiovascular:     Rate and Rhythm: Normal rate and regular rhythm.     Heart sounds: No murmur heard.   Pulmonary:     Effort: Pulmonary effort is normal. No respiratory distress.  Abdominal:     General: There is no distension.     Palpations: Abdomen is soft.     Tenderness: There is no abdominal tenderness.  Musculoskeletal:        General: Normal range of motion.     Cervical back: Normal range of motion and neck supple.  Skin:    General: Skin is warm and dry.     Findings: No erythema.  Neurological:     Mental Status: She is alert and oriented to person, place, and time.  Psychiatric:        Mood and Affect: Affect normal.        CMP Latest Ref Rng & Units 09/03/2020  Glucose 70 - 99 mg/dL 126(H)  BUN 6 - 20 mg/dL 12  Creatinine 0.44 - 1.00 mg/dL 0.42(L)  Sodium 135 - 145 mmol/L 135  Potassium 3.5 - 5.1 mmol/L 3.6  Chloride 98 - 111 mmol/L 102  CO2 22 - 32 mmol/L 25  Calcium 8.9 - 10.3 mg/dL 8.9  Total Protein 6.5 - 8.1 g/dL 7.3  Total Bilirubin 0.3 - 1.2 mg/dL 0.5  Alkaline Phos 38 - 126 U/L 88  AST 15 - 41 U/L 22  ALT 0 - 44 U/L 18   CBC Latest Ref Rng & Units  09/03/2020  WBC 4.0 - 10.5 K/uL 5.6  Hemoglobin 12.0 - 15.0 g/dL 11.6(L)  Hematocrit 36.0 - 46.0 % 34.1(L)  Platelets 150 - 400 K/uL 139(L)    RADIOGRAPHIC STUDIES: I have personally reviewed the radiological images as listed and agreed with the findings in the report. CT CHEST ABDOMEN PELVIS W CONTRAST  Result Date: 08/28/2020 CLINICAL DATA:  Colon cancer.  Restaging. EXAM: CT CHEST, ABDOMEN, AND PELVIS WITH CONTRAST TECHNIQUE: Multidetector CT  imaging of the chest, abdomen and pelvis was performed following the standard protocol during bolus administration of intravenous contrast. CONTRAST:  136m OMNIPAQUE IOHEXOL 300 MG/ML  SOLN COMPARISON:  Abdomen/pelvis CT 07/03/2020 FINDINGS: CT CHEST FINDINGS Cardiovascular: The heart size is normal. No substantial pericardial effusion. No thoracic aortic aneurysm. Mediastinum/Nodes: No mediastinal lymphadenopathy. There is no hilar lymphadenopathy. The esophagus has normal imaging features. There is no axillary lymphadenopathy. Lungs/Pleura: No suspicious pulmonary nodule or mass. No focal airspace consolidation. Subsegmental atelectasis noted posterior left base. No pleural effusion. Musculoskeletal: No worrisome lytic or sclerotic osseous abnormality. CT ABDOMEN PELVIS FINDINGS Hepatobiliary: No suspicious focal abnormality within the liver parenchyma. There is no evidence for gallstones, gallbladder wall thickening, or pericholecystic fluid. No intrahepatic or extrahepatic biliary dilation. Pancreas: No focal mass lesion. No dilatation of the main duct. No intraparenchymal cyst. No peripancreatic edema. Spleen: No splenomegaly. No focal mass lesion. Adrenals/Urinary Tract: No adrenal nodule or mass. Kidneys unremarkable. No evidence for hydroureter. The urinary bladder appears normal for the degree of distention. Stomach/Bowel: Stomach is unremarkable. No gastric wall thickening. No evidence of outlet obstruction. Duodenum is normally positioned as is the  ligament of [place]. No small bowel wall thickening. No small bowel dilatation. The terminal ileum is normal. The appendix is normal. Left hemicolectomy. Vascular/Lymphatic: No abdominal aortic aneurysm. There is no gastrohepatic or hepatoduodenal ligament lymphadenopathy. No retroperitoneal or mesenteric lymphadenopathy. No pelvic sidewall lymphadenopathy. Reproductive: The uterus is unremarkable.  There is no adnexal mass. Other: No intraperitoneal free fluid. Scarring noted right lower quadrant. Left percutaneous peritoneal drain seen previously has been removed in the interval. Scarring in the anterior pelvis noted with near complete resolution of extraperitoneal edema in the lower abdomen/pelvis. Musculoskeletal: No worrisome lytic or sclerotic osseous abnormality. IMPRESSION: 1. No evidence for metastatic disease in the chest, abdomen, or pelvis. 2. Interval removal of the left percutaneous peritoneal drain with near complete resolution of extraperitoneal edema in the lower abdomen/pelvis. 3. Left hemicolectomy. 4. Aortic Atherosclerosis (ICD10-I70.0). Electronically Signed   By: EMisty StanleyM.D.   On: 08/28/2020 08:26   DG CHEST PORT 1 VIEW  Result Date: 08/29/2020 CLINICAL DATA:  Post Port-A-Cath insertion EXAM: PORTABLE CHEST 1 VIEW COMPARISON:  Portable exam 0856 hours compared to 03/28/2020 FINDINGS: RIGHT jugular Port-A-Cath with tip projecting over cavoatrial junction. Enlargement of cardiac silhouette with vascular congestion. Mediastinal contours normal for slight degree of rotation to the LEFT. Decreased lung volumes with mild bibasilar atelectasis. Cannot exclude minimal perihilar infiltrate. No pleural effusion or pneumothorax. Bones unremarkable. IMPRESSION: No pneumothorax following RIGHT jugular Port-A-Cath insertion. Mild bibasilar atelectasis and question minimal perihilar infiltrate. Electronically Signed   By: MLavonia DanaM.D.   On: 08/29/2020 09:10   DG C-Arm 1-60 Min-No  Report  Result Date: 08/29/2020 Fluoroscopy was utilized by the requesting physician.  No radiographic interpretation.    Assessment and plan-  1. [place]   2. Iron deficiency anemia due to chronic blood loss   3. Encounter for antineoplastic chemotherapy   Cancer Staging Malignant ne[place] of sigmoid colon ([place] Staging form: Colon and Rectum, AJCC 8th Edition - Clinical: No stage assigned - Unsigned - Pathologic: Stage IIIB (pT3, pN1c, cM0) - Signed by YEarlie Server MD on 02/15/2020  #Stage IIIb colon cancer- 01/30/2020 pT3 pN1c, MMR intact  Interval CT chest abdomen pelvis images were independently reviewed by me and discussed with patient No disease recurrence or progression of disease. Labs are reviewed and discussed with patient. Counts are  acceptable to proceed with cycle 1 adjuvant FOLFOX.  #Anemia, hemoglobin is 11.6.  Continue to monitor. #Mild thrombocytopenia, stable. #Morbid obesity, recommend exercise as tolerated.  Healthy diet and lifestyle modification   Spanish interpretor was present for the entire encounter.  Supportive care measures are necessary for patient well-being and will be provided as necessary. We spent sufficient time to discuss many aspect of care, questions were answered to patient's satisfaction.   Follow-up in 1 week for evaluation of tolerability.  Follow-up in 2 weeks for evaluation prior to cycle 2 treatment. Earlie Server, MD, PhD Hematology Oncology [facility] Pager- [phone] 09/03/2020

## 2020-09-05 ENCOUNTER — Other Ambulatory Visit: Payer: Self-pay

## 2020-09-05 ENCOUNTER — Inpatient Hospital Stay: Payer: Self-pay

## 2020-09-05 DIAGNOSIS — C187 Malignant neoplasm of sigmoid colon: Secondary | ICD-10-CM

## 2020-09-05 MED ORDER — HEPARIN SOD (PORK) LOCK FLUSH 100 UNIT/ML IV SOLN
INTRAVENOUS | Status: AC
  Filled 2020-09-05: qty 5

## 2020-09-05 MED ORDER — SODIUM CHLORIDE 0.9% FLUSH
10.0000 mL | INTRAVENOUS | Status: DC | PRN
  Administered 2020-09-05: 13:00:00 10 mL
  Filled 2020-09-05: qty 10

## 2020-09-05 MED ORDER — HEPARIN SOD (PORK) LOCK FLUSH 100 UNIT/ML IV SOLN
500.0000 [IU] | Freq: Once | INTRAVENOUS | Status: AC | PRN
  Administered 2020-09-05: 13:00:00 500 [IU]
  Filled 2020-09-05: qty 5

## 2020-09-11 ENCOUNTER — Other Ambulatory Visit: Payer: Self-pay

## 2020-09-11 ENCOUNTER — Inpatient Hospital Stay: Payer: Self-pay | Attending: Oncology

## 2020-09-11 ENCOUNTER — Encounter: Payer: Self-pay | Admitting: Oncology

## 2020-09-11 ENCOUNTER — Inpatient Hospital Stay: Payer: Self-pay

## 2020-09-11 ENCOUNTER — Inpatient Hospital Stay (HOSPITAL_BASED_OUTPATIENT_CLINIC_OR_DEPARTMENT_OTHER): Payer: Self-pay | Admitting: Oncology

## 2020-09-11 VITALS — BP 130/84 | HR 83 | Temp 98.2°F | Resp 18 | Wt 187.8 lb

## 2020-09-11 DIAGNOSIS — D6959 Other secondary thrombocytopenia: Secondary | ICD-10-CM

## 2020-09-11 DIAGNOSIS — D696 Thrombocytopenia, unspecified: Secondary | ICD-10-CM | POA: Insufficient documentation

## 2020-09-11 DIAGNOSIS — Z8616 Personal history of COVID-19: Secondary | ICD-10-CM | POA: Insufficient documentation

## 2020-09-11 DIAGNOSIS — E669 Obesity, unspecified: Secondary | ICD-10-CM | POA: Insufficient documentation

## 2020-09-11 DIAGNOSIS — C187 Malignant neoplasm of sigmoid colon: Secondary | ICD-10-CM

## 2020-09-11 DIAGNOSIS — D6481 Anemia due to antineoplastic chemotherapy: Secondary | ICD-10-CM | POA: Insufficient documentation

## 2020-09-11 DIAGNOSIS — T451X5A Adverse effect of antineoplastic and immunosuppressive drugs, initial encounter: Secondary | ICD-10-CM | POA: Insufficient documentation

## 2020-09-11 DIAGNOSIS — D500 Iron deficiency anemia secondary to blood loss (chronic): Secondary | ICD-10-CM | POA: Insufficient documentation

## 2020-09-11 DIAGNOSIS — Z79899 Other long term (current) drug therapy: Secondary | ICD-10-CM | POA: Insufficient documentation

## 2020-09-11 DIAGNOSIS — D701 Agranulocytosis secondary to cancer chemotherapy: Secondary | ICD-10-CM | POA: Insufficient documentation

## 2020-09-11 DIAGNOSIS — Z791 Long term (current) use of non-steroidal anti-inflammatories (NSAID): Secondary | ICD-10-CM | POA: Insufficient documentation

## 2020-09-11 DIAGNOSIS — M25511 Pain in right shoulder: Secondary | ICD-10-CM | POA: Insufficient documentation

## 2020-09-11 DIAGNOSIS — Z6839 Body mass index (BMI) 39.0-39.9, adult: Secondary | ICD-10-CM | POA: Insufficient documentation

## 2020-09-11 DIAGNOSIS — Z9049 Acquired absence of other specified parts of digestive tract: Secondary | ICD-10-CM | POA: Insufficient documentation

## 2020-09-11 DIAGNOSIS — E039 Hypothyroidism, unspecified: Secondary | ICD-10-CM | POA: Insufficient documentation

## 2020-09-11 DIAGNOSIS — Z95828 Presence of other vascular implants and grafts: Secondary | ICD-10-CM

## 2020-09-11 DIAGNOSIS — Z5111 Encounter for antineoplastic chemotherapy: Secondary | ICD-10-CM | POA: Insufficient documentation

## 2020-09-11 LAB — CBC WITH DIFFERENTIAL/PLATELET
Abs Immature Granulocytes: 0.02 10*3/uL (ref 0.00–0.07)
Basophils Absolute: 0 10*3/uL (ref 0.0–0.1)
Basophils Relative: 0 %
Eosinophils Absolute: 0.1 10*3/uL (ref 0.0–0.5)
Eosinophils Relative: 2 %
HCT: 35.6 % — ABNORMAL LOW (ref 36.0–46.0)
Hemoglobin: 12.2 g/dL (ref 12.0–15.0)
Immature Granulocytes: 0 %
Lymphocytes Relative: 30 %
Lymphs Abs: 1.5 10*3/uL (ref 0.7–4.0)
MCH: 28.7 pg (ref 26.0–34.0)
MCHC: 34.3 g/dL (ref 30.0–36.0)
MCV: 83.8 fL (ref 80.0–100.0)
Monocytes Absolute: 0.4 10*3/uL (ref 0.1–1.0)
Monocytes Relative: 8 %
Neutro Abs: 3 10*3/uL (ref 1.7–7.7)
Neutrophils Relative %: 60 %
Platelets: 143 10*3/uL — ABNORMAL LOW (ref 150–400)
RBC: 4.25 MIL/uL (ref 3.87–5.11)
RDW: 13.2 % (ref 11.5–15.5)
WBC: 4.9 10*3/uL (ref 4.0–10.5)
nRBC: 0 % (ref 0.0–0.2)

## 2020-09-11 LAB — COMPREHENSIVE METABOLIC PANEL
ALT: 17 U/L (ref 0–44)
AST: 28 U/L (ref 15–41)
Albumin: 4 g/dL (ref 3.5–5.0)
Alkaline Phosphatase: 85 U/L (ref 38–126)
Anion gap: 9 (ref 5–15)
BUN: 12 mg/dL (ref 6–20)
CO2: 25 mmol/L (ref 22–32)
Calcium: 9.1 mg/dL (ref 8.9–10.3)
Chloride: 102 mmol/L (ref 98–111)
Creatinine, Ser: 0.4 mg/dL — ABNORMAL LOW (ref 0.44–1.00)
GFR, Estimated: >60 mL/min (ref >60)
Glucose, Bld: 147 mg/dL — ABNORMAL HIGH (ref 70–99)
Potassium: 3.6 mmol/L (ref 3.5–5.1)
Sodium: 136 mmol/L (ref 135–145)
Total Bilirubin: 0.3 mg/dL (ref 0.3–1.2)
Total Protein: 7.4 g/dL (ref 6.5–8.1)

## 2020-09-11 MED ORDER — HEPARIN SOD (PORK) LOCK FLUSH 100 UNIT/ML IV SOLN
500.0000 [IU] | Freq: Once | INTRAVENOUS | Status: AC
  Administered 2020-09-11: 500 [IU] via INTRAVENOUS
  Filled 2020-09-11: qty 5

## 2020-09-11 MED ORDER — SODIUM CHLORIDE 0.9% FLUSH
10.0000 mL | INTRAVENOUS | Status: DC | PRN
  Administered 2020-09-11: 10 mL via INTRAVENOUS
  Filled 2020-09-11: qty 10

## 2020-09-11 NOTE — Progress Notes (Signed)
Hematology/Oncology Progress Note [facility] [phone] [phone]  Patient Care Team: [name], MD as PCP - General (Family Medicine) [name], RN as Registered Nurse [name], RN as Registered Nurse   Name of the patient: Debra [name]  [id number]  1966-07-31  Date of visit: 09/11/20  PERTINENT ONCOLOGY HISTORY Debra [name] is a 55 y.o.afemale who has above oncology history reviewed by me today presented for follow up visit for management of Stage IIIB Colon cancer seen by me on 11/21/2019 during her hospitalization. Patient has a history of Covid positivity. Severe iron deficiency status post IV Venofer treatment. Colonoscopy 11/21/2019 showed a malignant appearing severe stenosis found in the sigmoid colon at 25 cm proximal to the anus and was nontransversed.  Biopsy was taken.  Surgical pathology is pending  Nonbleeding external hemorrhoids Biopsy was not diagnostic 01/03/2020 virtual colonoscopy showed long segment area of wall thickening and annular constriction within the sigmoid colon in the area of previous concern most compatible with annular malignancy.  Remainder of the colon evaluation is somewhat limited due to large amount of retained stool and incomplete distention proximal to the annular constricting lesion. 01/30/2020 Patient underwent left hemicolectomy by Dr. Celine Ahr and a biopsy showed invasive adenocarcinoma, poorly differentiated.  37 lymph nodes negative for malignancy.  Grade 3, all margins are uninvolved.  Perineural invasion present.  Tumor deposit present. pT3 pN1c cM0 Checks x-ray did not show any lung mass. #Postop, patient developed postop ileus and she required TPN, PICC and NG tube placement.  Postoperation day 9 she continued to have increased leukocytosis and was subsequently found to have a midline wound infection to the inferior portion of the wound.  Patient was treated with  antibiotics.  07/03/2020 CT abdomen pelvis w contrast showed well positioned drainage catheter with apparent resolution of the intra-abdominal abscess. 07/10/2020 seen by [facility] and patient had drainage catheter removed by IR on 07/12/2020   INTERVAL HISTORY Debra [name] is a 55 y.o. female who has above history reviewed by me today presents for follow up visit for management of stage IIIb colon cancer Problems and complaints are listed below: Patient underwent: Cycle 1 FOLFOX 1 week ago.  She tolerates well.  No nausea vomiting diarrhea. She has questions about her Mediport.  No fever or chills. She is here by herself Review of systems- Review of Systems  Constitutional: Negative for appetite change, chills, fatigue, fever and unexpected weight change.  HENT:   Negative for hearing loss and voice change.   Eyes: Negative for eye problems.  Respiratory: Negative for chest tightness, cough and shortness of breath.   Cardiovascular: Negative for chest pain.  Gastrointestinal: Negative for abdominal distention, abdominal pain, blood in stool and constipation.  Endocrine: Negative for hot flashes.  Genitourinary: Negative for difficulty urinating, dysuria and frequency.   Musculoskeletal: Negative for arthralgias.  Skin: Negative for itching, rash and wound.  Neurological: Positive for headaches. Negative for extremity weakness and light-headedness.  Hematological: Negative for adenopathy.  Psychiatric/Behavioral: Negative for confusion.    No Known Allergies  Patient Active Problem List   Diagnosis Date Noted  . Encounter for antineoplastic chemotherapy 09/03/2020  . Encounter for central line placement   . Loss of weight 02/15/2020  . Goals of care, counseling/discussion 02/15/2020  . Malignant neoplasm of sigmoid colon ([place]) 02/15/2020  . Pressure injury of skin 02/03/2020  . Bowel obstruction ([place]) 01/29/2020  . Iron deficiency anemia due to chronic blood loss  11/22/2019  . COVID-19 virus detected 11/20/2019  . Symptomatic anemia 11/20/2019  . Hypothyroidism 11/20/2019  . Iron deficiency anemia 11/20/2019  . GIB (gastrointestinal bleeding) 11/20/2019     Past Medical History:  Diagnosis Date  . Anemia   . Colon cancer ([place]) 04/2020  . Hypothyroidism   . Thyroid disease      Past Surgical History:  Procedure Laterality Date  . COLONOSCOPY WITH PROPOFOL N/A 11/21/2019   Procedure: COLONOSCOPY WITH PROPOFOL;  Surgeon: Lin Landsman, MD;  Location: [facility] ENDOSCOPY;  Service: Gastroenterology;  Laterality: N/A;  . CYSTOSCOPY WITH STENT PLACEMENT Left 01/30/2020   Procedure: CYSTOSCOPY WITH STENT PLACEMENT;  Surgeon: Hollice Espy, MD;  Location: [facility] ORS;  Service: Urology;  Laterality: Left;  . ESOPHAGOGASTRODUODENOSCOPY (EGD) WITH PROPOFOL N/A 11/21/2019   Procedure: ESOPHAGOGASTRODUODENOSCOPY (EGD) WITH PROPOFOL;  Surgeon: Lin Landsman, MD;  Location: [facility] ENDOSCOPY;  Service: Gastroenterology;  Laterality: N/A;  . IR RADIOLOGIST EVAL & MGMT  07/03/2020  . IR RADIOLOGIST EVAL & MGMT  07/12/2020  . PARTIAL COLECTOMY N/A 01/30/2020   Procedure: PARTIAL COLECTOMY Sigmoid;  Surgeon: [name], MD;  Location: [facility] ORS;  Service: General;  Laterality: N/A;  . PORTACATH PLACEMENT Left 03/28/2020   Procedure: INSERTION PORT-A-CATH;  Surgeon: [name], MD;  Location: [facility] ORS;  Service: General;  Laterality: Left;  . PORTACATH PLACEMENT N/A 08/29/2020   Procedure: INSERTION PORT-A-CATH, chemotherapy;  Surgeon: [name], MD;  Location: [facility] ORS;  Service: General;  Laterality: N/A;    Social History   Socioeconomic History  . Marital status: Single    Spouse name: Not on file  . Number of children: Not on file  . Years of education: Not on file  . Highest education level: Not on file  Occupational History  . Occupation: Factory  Tobacco Use  . Smoking status: Never Smoker  . Smokeless tobacco: Never Used   Vaping Use  . Vaping Use: Never used  Substance and Sexual Activity  . Alcohol use: Never  . Drug use: Never  . Sexual activity: Not on file  Other Topics Concern  . Not on file  Social History Narrative   Lives at home with significant other   Social Determinants of Health   Financial Resource Strain: Not on file  Food Insecurity: Not on file  Transportation Needs: Not on file  Physical Activity: Not on file  Stress: Not on file  Social Connections: Not on file  Intimate Partner Violence: Not on file     Family History  Problem Relation Age of Onset  . Diabetes Sister      Current Outpatient Medications:  .  B-D 3CC LUER-LOK SYR 25GX1" 25G X 1" 3 ML MISC, Use with B12 injection, Disp: , Rfl:  .  cyanocobalamin (,VITAMIN B-12,) 1000 MCG/ML injection, Inject 1,000 mcg into the muscle every 30 (thirty) days., Disp: , Rfl:  .  ibuprofen (ADVIL) 800 MG tablet, Take 1 tablet (800 mg total) by mouth every 8 (eight) hours as needed., Disp: 30 tablet, Rfl: 0 .  levothyroxine (SYNTHROID) 137 MCG tablet, Take 137 mcg by mouth daily before breakfast. , Disp: , Rfl:  .  lidocaine-prilocaine (EMLA) cream, Apply to affected area once, Disp: 30 g, Rfl: 3 .  ondansetron (ZOFRAN) 8 MG tablet, Take 1 tablet (8 mg total) by mouth 2 (two) times daily as needed for refractory nausea / vomiting. Start on day 3 after chemotherapy., Disp: 30 tablet, Rfl: 1 .  prochlorperazine (COMPAZINE) 10 MG tablet,  Take 1 tablet (10 mg total) by mouth every 6 (six) hours as needed (Nausea or vomiting)., Disp: 30 tablet, Rfl: 1 .  docusate sodium (COLACE) 100 MG capsule, Take 1 capsule (100 mg total) by mouth daily. (Patient not taking: No sig reported), Disp: 30 capsule, Rfl: 1 .  ferrous sulfate 325 (65 FE) MG tablet, Take 1 tablet (325 mg total) by mouth 2 (two) times daily with a meal. (Patient not taking: No sig reported), Disp: 60 tablet, Rfl: 3   Physical exam:  Vitals:   09/11/20 1107  BP: 130/84   Pulse: 83  Resp: 18  Temp: 98.2 F (36.8 C)  Weight: 187 lb 12.8 oz (85.2 kg)   Physical Exam Constitutional:      General: She is not in acute distress.    Appearance: She is obese.     Comments: Patient walks independently  HENT:     Head: Normocephalic and atraumatic.     Mouth/Throat:     Pharynx: No oropharyngeal exudate.  Eyes:     General: No scleral icterus.    Pupils: Pupils are equal, round, and reactive to light.  Cardiovascular:     Rate and Rhythm: Normal rate and regular rhythm.     Heart sounds: No murmur heard.   Pulmonary:     Effort: Pulmonary effort is normal. No respiratory distress.  Abdominal:     General: There is no distension.     Palpations: Abdomen is soft.     Tenderness: There is no abdominal tenderness.  Musculoskeletal:        General: Normal range of motion.     Cervical back: Normal range of motion and neck supple.  Skin:    General: Skin is warm and dry.     Findings: No erythema.     Comments: Right side anterior chest wall + Mediport  Neurological:     Mental Status: She is alert and oriented to person, place, and time.  Psychiatric:        Mood and Affect: Affect normal.        CMP Latest Ref Rng & Units 09/11/2020  Glucose 70 - 99 mg/dL 147(H)  BUN 6 - 20 mg/dL 12  Creatinine 0.44 - 1.00 mg/dL 0.40(L)  Sodium 135 - 145 mmol/L 136  Potassium 3.5 - 5.1 mmol/L 3.6  Chloride 98 - 111 mmol/L 102  CO2 22 - 32 mmol/L 25  Calcium 8.9 - 10.3 mg/dL 9.1  Total Protein 6.5 - 8.1 g/dL 7.4  Total Bilirubin 0.3 - 1.2 mg/dL 0.3  Alkaline Phos 38 - 126 U/L 85  AST 15 - 41 U/L 28  ALT 0 - 44 U/L 17   CBC Latest Ref Rng & Units 09/11/2020  WBC 4.0 - 10.5 K/uL 4.9  Hemoglobin 12.0 - 15.0 g/dL 12.2  Hematocrit 36.0 - 46.0 % 35.6(L)  Platelets 150 - 400 K/uL 143(L)    RADIOGRAPHIC STUDIES: I have personally reviewed the radiological images as listed and agreed with the findings in the report. CT CHEST ABDOMEN PELVIS W  CONTRAST  Result Date: 08/28/2020 CLINICAL DATA:  Colon cancer.  Restaging. EXAM: CT CHEST, ABDOMEN, AND PELVIS WITH CONTRAST TECHNIQUE: Multidetector CT imaging of the chest, abdomen and pelvis was performed following the standard protocol during bolus administration of intravenous contrast. CONTRAST:  154m OMNIPAQUE IOHEXOL 300 MG/ML  SOLN COMPARISON:  Abdomen/pelvis CT 07/03/2020 FINDINGS: CT CHEST FINDINGS Cardiovascular: The heart size is normal. No substantial pericardial effusion. No thoracic aortic aneurysm. Mediastinum/Nodes:  No mediastinal lymphadenopathy. There is no hilar lymphadenopathy. The esophagus has normal imaging features. There is no axillary lymphadenopathy. Lungs/Pleura: No suspicious pulmonary nodule or mass. No focal airspace consolidation. Subsegmental atelectasis noted posterior left base. No pleural effusion. Musculoskeletal: No worrisome lytic or sclerotic osseous abnormality. CT ABDOMEN PELVIS FINDINGS Hepatobiliary: No suspicious focal abnormality within the liver parenchyma. There is no evidence for gallstones, gallbladder wall thickening, or pericholecystic fluid. No intrahepatic or extrahepatic biliary dilation. Pancreas: No focal mass lesion. No dilatation of the main duct. No intraparenchymal cyst. No peripancreatic edema. Spleen: No splenomegaly. No focal mass lesion. Adrenals/Urinary Tract: No adrenal nodule or mass. Kidneys unremarkable. No evidence for hydroureter. The urinary bladder appears normal for the degree of distention. Stomach/Bowel: Stomach is unremarkable. No gastric wall thickening. No evidence of outlet obstruction. Duodenum is normally positioned as is the ligament of Treitz. No small bowel wall thickening. No small bowel dilatation. The terminal ileum is normal. The appendix is normal. Left hemicolectomy. Vascular/Lymphatic: No abdominal aortic aneurysm. There is no gastrohepatic or hepatoduodenal ligament lymphadenopathy. No retroperitoneal or mesenteric  lymphadenopathy. No pelvic sidewall lymphadenopathy. Reproductive: The uterus is unremarkable.  There is no adnexal mass. Other: No intraperitoneal free fluid. Scarring noted right lower quadrant. Left percutaneous peritoneal drain seen previously has been removed in the interval. Scarring in the anterior pelvis noted with near complete resolution of extraperitoneal edema in the lower abdomen/pelvis. Musculoskeletal: No worrisome lytic or sclerotic osseous abnormality. IMPRESSION: 1. No evidence for metastatic disease in the chest, abdomen, or pelvis. 2. Interval removal of the left percutaneous peritoneal drain with near complete resolution of extraperitoneal edema in the lower abdomen/pelvis. 3. Left hemicolectomy. 4. Aortic Atherosclerosis (ICD10-I70.0). Electronically Signed   By: Misty Stanley M.D.   On: 08/28/2020 08:26   DG CHEST PORT 1 VIEW  Result Date: 08/29/2020 CLINICAL DATA:  Post Port-A-Cath insertion EXAM: PORTABLE CHEST 1 VIEW COMPARISON:  Portable exam 0856 hours compared to 03/28/2020 FINDINGS: RIGHT jugular Port-A-Cath with tip projecting over cavoatrial junction. Enlargement of cardiac silhouette with vascular congestion. Mediastinal contours normal for slight degree of rotation to the LEFT. Decreased lung volumes with mild bibasilar atelectasis. Cannot exclude minimal perihilar infiltrate. No pleural effusion or pneumothorax. Bones unremarkable. IMPRESSION: No pneumothorax following RIGHT jugular Port-A-Cath insertion. Mild bibasilar atelectasis and question minimal perihilar infiltrate. Electronically Signed   By: Lavonia Dana M.D.   On: 08/29/2020 09:10   DG C-Arm 1-60 Min-No Report  Result Date: 08/29/2020 Fluoroscopy was utilized by the requesting physician.  No radiographic interpretation.    Assessment and plan-  1. Malignant neoplasm of sigmoid colon ([place])   2. Chemotherapy-induced thrombocytopenia   3. Obesity (BMI 35.0-39.9 without comorbidity)   4. Port-A-Cath in place    Cancer Staging Malignant neoplasm of sigmoid colon [facility]) Staging form: Colon and Rectum, AJCC 8th Edition - Clinical: No stage assigned - Unsigned - Pathologic: Stage IIIB (pT3, pN1c, cM0) - Signed by Earlie Server, MD on 02/15/2020  #Stage IIIb colon cancer- 01/30/2020 pT3 pN1c, MMR intact  Status post 1 cycle of FOLFOX.  She tolerates well. Labs are reviewed and discussed with patient.  #Anemia, hemoglobin is stable.  Monitor. #Mild thrombocytopenia, stable.  Monitor #Morbid obesity, recommend exercise as tolerated.  Healthy diet and lifestyle modification #Port-A-Cath in place, discussed with her about port care. Spanish interpretor was present for the entire encounter.  Supportive care measures are necessary for patient well-being and will be provided as necessary. We spent sufficient time to discuss many aspect of  care, questions were answered to patient's satisfaction.   Follow-up in 1 week for evaluation prior to cycle 2 treatment. Earlie Server, MD, PhD Hematology Oncology [facility] at [facility] Pager- [phone] 09/11/2020

## 2020-09-11 NOTE — Progress Notes (Signed)
Pt here for follow up after first chemo. Pt reports that she had a slight headache and some diarrhea.

## 2020-09-17 ENCOUNTER — Encounter: Payer: Self-pay | Admitting: Oncology

## 2020-09-17 ENCOUNTER — Inpatient Hospital Stay: Payer: Self-pay

## 2020-09-17 ENCOUNTER — Inpatient Hospital Stay (HOSPITAL_BASED_OUTPATIENT_CLINIC_OR_DEPARTMENT_OTHER): Payer: Self-pay | Admitting: Oncology

## 2020-09-17 ENCOUNTER — Other Ambulatory Visit: Payer: Self-pay

## 2020-09-17 VITALS — BP 117/59 | HR 63 | Temp 97.4°F | Resp 18 | Wt 192.2 lb

## 2020-09-17 DIAGNOSIS — D6481 Anemia due to antineoplastic chemotherapy: Secondary | ICD-10-CM

## 2020-09-17 DIAGNOSIS — D6959 Other secondary thrombocytopenia: Secondary | ICD-10-CM

## 2020-09-17 DIAGNOSIS — Z5111 Encounter for antineoplastic chemotherapy: Secondary | ICD-10-CM

## 2020-09-17 DIAGNOSIS — C187 Malignant neoplasm of sigmoid colon: Secondary | ICD-10-CM

## 2020-09-17 DIAGNOSIS — T451X5A Adverse effect of antineoplastic and immunosuppressive drugs, initial encounter: Secondary | ICD-10-CM

## 2020-09-17 DIAGNOSIS — D702 Other drug-induced agranulocytosis: Secondary | ICD-10-CM

## 2020-09-17 DIAGNOSIS — E669 Obesity, unspecified: Secondary | ICD-10-CM

## 2020-09-17 LAB — COMPREHENSIVE METABOLIC PANEL
ALT: 19 U/L (ref 0–44)
AST: 27 U/L (ref 15–41)
Albumin: 3.9 g/dL (ref 3.5–5.0)
Alkaline Phosphatase: 94 U/L (ref 38–126)
Anion gap: 8 (ref 5–15)
BUN: 12 mg/dL (ref 6–20)
CO2: 24 mmol/L (ref 22–32)
Calcium: 8.8 mg/dL — ABNORMAL LOW (ref 8.9–10.3)
Chloride: 104 mmol/L (ref 98–111)
Creatinine, Ser: 0.34 mg/dL — ABNORMAL LOW (ref 0.44–1.00)
GFR, Estimated: >60 mL/min (ref >60)
Glucose, Bld: 137 mg/dL — ABNORMAL HIGH (ref 70–99)
Potassium: 3.7 mmol/L (ref 3.5–5.1)
Sodium: 136 mmol/L (ref 135–145)
Total Bilirubin: 0.6 mg/dL (ref 0.3–1.2)
Total Protein: 6.8 g/dL (ref 6.5–8.1)

## 2020-09-17 LAB — CBC WITH DIFFERENTIAL/PLATELET
Abs Immature Granulocytes: 0.01 10*3/uL (ref 0.00–0.07)
Basophils Absolute: 0 10*3/uL (ref 0.0–0.1)
Basophils Relative: 0 %
Eosinophils Absolute: 0.1 10*3/uL (ref 0.0–0.5)
Eosinophils Relative: 2 %
HCT: 31.8 % — ABNORMAL LOW (ref 36.0–46.0)
Hemoglobin: 10.9 g/dL — ABNORMAL LOW (ref 12.0–15.0)
Immature Granulocytes: 0 %
Lymphocytes Relative: 44 %
Lymphs Abs: 1.4 10*3/uL (ref 0.7–4.0)
MCH: 28.8 pg (ref 26.0–34.0)
MCHC: 34.3 g/dL (ref 30.0–36.0)
MCV: 84.1 fL (ref 80.0–100.0)
Monocytes Absolute: 0.4 10*3/uL (ref 0.1–1.0)
Monocytes Relative: 12 %
Neutro Abs: 1.3 10*3/uL — ABNORMAL LOW (ref 1.7–7.7)
Neutrophils Relative %: 42 %
Platelets: 139 10*3/uL — ABNORMAL LOW (ref 150–400)
RBC: 3.78 MIL/uL — ABNORMAL LOW (ref 3.87–5.11)
RDW: 13.4 % (ref 11.5–15.5)
WBC: 3.2 10*3/uL — ABNORMAL LOW (ref 4.0–10.5)
nRBC: 0 % (ref 0.0–0.2)

## 2020-09-17 MED ORDER — MAGIC MOUTHWASH W/LIDOCAINE: 5.0000 mL | Freq: Four times a day (QID) | ORAL | 1 refills | Status: DC | PRN

## 2020-09-17 MED ORDER — PALONOSETRON HCL INJECTION 0.25 MG/5ML
0.2500 mg | Freq: Once | INTRAVENOUS | Status: AC
  Administered 2020-09-17: 0.25 mg via INTRAVENOUS
  Filled 2020-09-17: qty 5

## 2020-09-17 MED ORDER — LEUCOVORIN CALCIUM INJECTION 350 MG
750.0000 mg | Freq: Once | INTRAMUSCULAR | Status: AC
Start: 2020-09-17 — End: 2020-09-17
  Administered 2020-09-17: 750 mg via INTRAVENOUS
  Filled 2020-09-17: qty 37.5

## 2020-09-17 MED ORDER — HYDROCORTISONE 0.5 % EX CREA: 1.0000 "application " | TOPICAL_CREAM | Freq: Two times a day (BID) | CUTANEOUS | 0 refills | Status: DC

## 2020-09-17 MED ORDER — LIDOCAINE-PRILOCAINE 2.5-2.5 % EX CREA: TOPICAL_CREAM | CUTANEOUS | 3 refills | Status: DC

## 2020-09-17 MED ORDER — DEXTROSE 5 % IV SOLN
Freq: Once | INTRAVENOUS | Status: AC
  Filled 2020-09-17: qty 250

## 2020-09-17 MED ORDER — SODIUM CHLORIDE 0.9 % IV SOLN
2400.0000 mg/m2 | INTRAVENOUS | Status: DC
  Administered 2020-09-17: 4400 mg via INTRAVENOUS
  Filled 2020-09-17: qty 88

## 2020-09-17 MED ORDER — OXALIPLATIN CHEMO INJECTION 100 MG/20ML
160.0000 mg | Freq: Once | INTRAVENOUS | Status: AC
  Administered 2020-09-17: 160 mg via INTRAVENOUS
  Filled 2020-09-17: qty 32

## 2020-09-17 MED ORDER — SODIUM CHLORIDE 0.9 % IV SOLN
10.0000 mg | Freq: Once | INTRAVENOUS | Status: AC
  Administered 2020-09-17: 10 mg via INTRAVENOUS
  Filled 2020-09-17: qty 10

## 2020-09-17 NOTE — Progress Notes (Signed)
Pt here for follow up. Pt reports pain to right arm, got worse with port insertion to left chest.

## 2020-09-17 NOTE — Progress Notes (Signed)
Per MD ok to treat with ANC 1.3, 5FU bolus removed for this treatment.

## 2020-09-17 NOTE — Progress Notes (Signed)
Hematology/Oncology Progress Note [facility] [phone] [phone]  Patient Care Team: [name], MD as PCP - General (Family Medicine) [name], RN as Registered Nurse [name], RN as Registered Nurse   Name of the patient: Debra [name]  [id number]  Mar 07, 1966  Date of visit: 09/17/20  PERTINENT ONCOLOGY HISTORY Debra [name] is a 55 y.o.afemale who has above oncology history reviewed by me today presented for follow up visit for management of Stage IIIB Colon cancer seen by me on 11/21/2019 during her hospitalization. Patient has a history of Covid positivity. Severe iron deficiency status post IV Venofer treatment. Colonoscopy 11/21/2019 showed a malignant appearing severe stenosis found in the sigmoid colon at 25 cm proximal to the anus and was nontransversed.  Biopsy was taken.  Surgical pathology is pending  Nonbleeding external hemorrhoids Biopsy was not diagnostic 01/03/2020 virtual colonoscopy showed long segment area of wall thickening and annular constriction within the sigmoid colon in the area of previous concern most compatible with annular malignancy.  Remainder of the colon evaluation is somewhat limited due to large amount of retained stool and incomplete distention proximal to the annular constricting lesion. 01/30/2020 Patient underwent left hemicolectomy by Dr. Celine Ahr and a biopsy showed invasive adenocarcinoma, poorly differentiated.  37 lymph nodes negative for malignancy.  Grade 3, all margins are uninvolved.  Perineural invasion present.  Tumor deposit present. pT3 pN1c cM0 Checks x-ray did not show any lung mass. #Postop, patient developed postop ileus and she required TPN, PICC and NG tube placement.  Postoperation day 9 she continued to have increased leukocytosis and was subsequently found to have a midline wound infection to the inferior portion of the wound.  Patient was treated with  antibiotics.  07/03/2020 CT abdomen pelvis w contrast showed well positioned drainage catheter with apparent resolution of the intra-abdominal abscess. 07/10/2020 seen by [facility] and patient had drainage catheter removed by IR on 07/12/2020   INTERVAL HISTORY Debra [name] is a 55 y.o. female who has above history reviewed by me today presents for follow up visit for management of stage IIIb colon cancer Problems and complaints are listed below: Patient underwent: Cycle 2 FOLFOX 2 weeks ago, she tolerates well.   Patient reports a rash under her left arm pit, rash onset was about a year ago, intermittent, itchy.  Rash initially came on after using deodorant.  She scratches sometimes. No nausea vomiting diarrhea.  She was accompanied by her daughter.  Review of systems- Review of Systems  Constitutional: Negative for appetite change, chills, fatigue, fever and unexpected weight change.  HENT:   Negative for hearing loss and voice change.   Eyes: Negative for eye problems.  Respiratory: Negative for chest tightness, cough and shortness of breath.   Cardiovascular: Negative for chest pain.  Gastrointestinal: Negative for abdominal distention, abdominal pain, blood in stool and constipation.  Endocrine: Negative for hot flashes.  Genitourinary: Negative for difficulty urinating, dysuria and frequency.   Musculoskeletal: Negative for arthralgias.  Skin: Positive for rash. Negative for itching and wound.  Neurological: Negative for extremity weakness, headaches and light-headedness.  Hematological: Negative for adenopathy.  Psychiatric/Behavioral: Negative for confusion.    No Known Allergies  Patient Active Problem List   Diagnosis Date Noted  . Encounter for antineoplastic chemotherapy 09/03/2020  . Encounter for central line placement   . Loss of weight 02/15/2020  . Goals of care, counseling/discussion 02/15/2020  . Malignant neoplasm of sigmoid colon ([place]) 02/15/2020  .  Pressure injury of skin 02/03/2020  . Bowel obstruction ([place]) 01/29/2020  . Iron deficiency anemia due to chronic blood loss 11/22/2019  . COVID-19 virus detected 11/20/2019  . Symptomatic anemia 11/20/2019  . Hypothyroidism 11/20/2019  . Iron deficiency anemia 11/20/2019  . GIB (gastrointestinal bleeding) 11/20/2019     Past Medical History:  Diagnosis Date  . Anemia   . Colon cancer ([place]) 04/2020  . Hypothyroidism   . Thyroid disease      Past Surgical History:  Procedure Laterality Date  . COLONOSCOPY WITH PROPOFOL N/A 11/21/2019   Procedure: COLONOSCOPY WITH PROPOFOL;  Surgeon: Lin Landsman, MD;  Location: [facility] ENDOSCOPY;  Service: Gastroenterology;  Laterality: N/A;  . CYSTOSCOPY WITH STENT PLACEMENT Left 01/30/2020   Procedure: CYSTOSCOPY WITH STENT PLACEMENT;  Surgeon: Hollice Espy, MD;  Location: [facility] ORS;  Service: Urology;  Laterality: Left;  . ESOPHAGOGASTRODUODENOSCOPY (EGD) WITH PROPOFOL N/A 11/21/2019   Procedure: ESOPHAGOGASTRODUODENOSCOPY (EGD) WITH PROPOFOL;  Surgeon: Lin Landsman, MD;  Location[facility] ENDOSCOPY;  Service: Gastroenterology;  Laterality: N/A;  . IR RADIOLOGIST EVAL & MGMT  07/03/2020  . IR RADIOLOGIST EVAL & MGMT  07/12/2020  . PARTIAL COLECTOMY N/A 01/30/2020   Procedure: PARTIAL COLECTOMY Sigmoid;  Surgeon: [name], MD;  Location: [place] ORS;  Service: General;  Laterality: N/A;  . PORTACATH PLACEMENT Left 03/28/2020   Procedure: INSERTION PORT-A-CATH;  Surgeon: [name], MD;  Location: [facility] ORS;  Service: General;  Laterality: Left;  . PORTACATH PLACEMENT N/A 08/29/2020   Procedure: INSERTION PORT-A-CATH, chemotherapy;  Surgeon: [name], MD;  Location: [facility] ORS;  Service: General;  Laterality: N/A;    Social History   Socioeconomic History  . Marital status: Single    Spouse name: Not on file  . Number of children: Not on file  . Years of education: Not on file  . Highest education level: Not on file   Occupational History  . Occupation: Factory  Tobacco Use  . Smoking status: Never Smoker  . Smokeless tobacco: Never Used  Vaping Use  . Vaping Use: Never used  Substance and Sexual Activity  . Alcohol use: Never  . Drug use: Never  . Sexual activity: Not on file  Other Topics Concern  . Not on file  Social History Narrative   Lives at home with significant other   Social Determinants of Health   Financial Resource Strain: Not on file  Food Insecurity: Not on file  Transportation Needs: Not on file  Physical Activity: Not on file  Stress: Not on file  Social Connections: Not on file  Intimate Partner Violence: Not on file     Family History  Problem Relation Age of Onset  . Diabetes Sister      Current Outpatient Medications:  .  B-D 3CC LUER-LOK SYR 25GX1" 25G X 1" 3 ML MISC, Use with B12 injection, Disp: , Rfl:  .  cyanocobalamin (,VITAMIN B-12,) 1000 MCG/ML injection, Inject 1,000 mcg into the muscle every 30 (thirty) days., Disp: , Rfl:  .  docusate sodium (COLACE) 100 MG capsule, Take 1 capsule (100 mg total) by mouth daily., Disp: 30 capsule, Rfl: 1 .  hydrocortisone cream 0.5 %, Apply 1 application topically 2 (two) times daily. Until rash is healed., Disp: 30 g, Rfl: 0 .  ibuprofen (ADVIL) 800 MG tablet, Take 1 tablet (800 mg total) by mouth every 8 (eight) hours as needed., Disp: 30 tablet, Rfl: 0 .  levothyroxine (SYNTHROID) 137 MCG tablet, Take 137 mcg by mouth daily  before breakfast. , Disp: , Rfl:  .  magic mouthwash w/lidocaine SOLN, Take 5 mLs by mouth 4 (four) times daily as needed for mouth pain. Sig: Swish/Spit 5-10 ml four times a day as needed. Dispense 480 ml. 1RF, Disp: 480 mL, Rfl: 1 .  ferrous sulfate 325 (65 FE) MG tablet, Take 1 tablet (325 mg total) by mouth 2 (two) times daily with a meal. (Patient not taking: No sig reported), Disp: 60 tablet, Rfl: 3 .  lidocaine-prilocaine (EMLA) cream, Apply to affected area once, Disp: 30 g, Rfl: 3 .   ondansetron (ZOFRAN) 8 MG tablet, Take 1 tablet (8 mg total) by mouth 2 (two) times daily as needed for refractory nausea / vomiting. Start on day 3 after chemotherapy. (Patient not taking: Reported on 09/17/2020), Disp: 30 tablet, Rfl: 1 .  prochlorperazine (COMPAZINE) 10 MG tablet, Take 1 tablet (10 mg total) by mouth every 6 (six) hours as needed (Nausea or vomiting). (Patient not taking: Reported on 09/17/2020), Disp: 30 tablet, Rfl: 1 No current facility-administered medications for this visit.  Facility-Administered Medications Ordered in Other Visits:  .  fluorouracil (ADRUCIL) 4,400 mg in sodium chloride 0.9 % 62 mL chemo infusion, 2,400 mg/m2 (Treatment Plan Recorded), Intravenous, 1 day or 1 dose, Earlie Server, MD, 4,400 mg at 09/17/20 1438   Physical exam:  Vitals:   09/17/20 0959  BP: (!) 117/59  Pulse: 63  Resp: 18  Temp: (!) 97.4 F (36.3 C)  Weight: 192 lb 3.2 oz (87.2 kg)   Physical Exam Constitutional:      General: She is not in acute distress.    Appearance: She is obese.     Comments: Patient walks independently  HENT:     Head: Normocephalic and atraumatic.     Mouth/Throat:     Pharynx: No oropharyngeal exudate.  Eyes:     General: No scleral icterus.    Pupils: Pupils are equal, round, and reactive to light.  Cardiovascular:     Rate and Rhythm: Normal rate and regular rhythm.     Heart sounds: No murmur heard.   Pulmonary:     Effort: Pulmonary effort is normal. No respiratory distress.  Abdominal:     General: There is no distension.     Palpations: Abdomen is soft.     Tenderness: There is no abdominal tenderness.  Musculoskeletal:        General: Normal range of motion.     Cervical back: Normal range of motion and neck supple.  Skin:    General: Skin is warm and dry.     Findings: No erythema.     Comments: Right side anterior chest wall + Mediport  Neurological:     Mental Status: She is alert and oriented to person, place, and time.   Psychiatric:        Mood and Affect: Affect normal.        CMP Latest Ref Rng & Units 09/17/2020  Glucose 70 - 99 mg/dL 137(H)  BUN 6 - 20 mg/dL 12  Creatinine 0.44 - 1.00 mg/dL 0.34(L)  Sodium 135 - 145 mmol/L 136  Potassium 3.5 - 5.1 mmol/L 3.7  Chloride 98 - 111 mmol/L 104  CO2 22 - 32 mmol/L 24  Calcium 8.9 - 10.3 mg/dL 8.8(L)  Total Protein 6.5 - 8.1 g/dL 6.8  Total Bilirubin 0.3 - 1.2 mg/dL 0.6  Alkaline Phos 38 - 126 U/L 94  AST 15 - 41 U/L 27  ALT 0 - 44 U/L  19   CBC Latest Ref Rng & Units 09/17/2020  WBC 4.0 - 10.5 K/uL 3.2(L)  Hemoglobin 12.0 - 15.0 g/dL 10.9(L)  Hematocrit 36.0 - 46.0 % 31.8(L)  Platelets 150 - 400 K/uL 139(L)    RADIOGRAPHIC STUDIES: I have personally reviewed the radiological images as listed and agreed with the findings in the report. CT CHEST ABDOMEN PELVIS W CONTRAST  Result Date: 08/28/2020 CLINICAL DATA:  Colon cancer.  Restaging. EXAM: CT CHEST, ABDOMEN, AND PELVIS WITH CONTRAST TECHNIQUE: Multidetector CT imaging of the chest, abdomen and pelvis was performed following the standard protocol during bolus administration of intravenous contrast. CONTRAST:  155m OMNIPAQUE IOHEXOL 300 MG/ML  SOLN COMPARISON:  Abdomen/pelvis CT 07/03/2020 FINDINGS: CT CHEST FINDINGS Cardiovascular: The heart size is normal. No substantial pericardial effusion. No thoracic aortic aneurysm. Mediastinum/Nodes: No mediastinal lymphadenopathy. There is no hilar lymphadenopathy. The esophagus has normal imaging features. There is no axillary lymphadenopathy. Lungs/Pleura: No suspicious pulmonary nodule or mass. No focal airspace consolidation. Subsegmental atelectasis noted posterior left base. No pleural effusion. Musculoskeletal: No worrisome lytic or sclerotic osseous abnormality. CT ABDOMEN PELVIS FINDINGS Hepatobiliary: No suspicious focal abnormality within the liver parenchyma. There is no evidence for gallstones, gallbladder wall thickening, or pericholecystic  fluid. No intrahepatic or extrahepatic biliary dilation. Pancreas: No focal mass lesion. No dilatation of the main duct. No intraparenchymal cyst. No peripancreatic edema. Spleen: No splenomegaly. No focal mass lesion. Adrenals/Urinary Tract: No adrenal nodule or mass. Kidneys unremarkable. No evidence for hydroureter. The urinary bladder appears normal for the degree of distention. Stomach/Bowel: Stomach is unremarkable. No gastric wall thickening. No evidence of outlet obstruction. Duodenum is normally positioned as is the ligament of Treitz. No small bowel wall thickening. No small bowel dilatation. The terminal ileum is normal. The appendix is normal. Left hemicolectomy. Vascular/Lymphatic: No abdominal aortic aneurysm. There is no gastrohepatic or hepatoduodenal ligament lymphadenopathy. No retroperitoneal or mesenteric lymphadenopathy. No pelvic sidewall lymphadenopathy. Reproductive: The uterus is unremarkable.  There is no adnexal mass. Other: No intraperitoneal free fluid. Scarring noted right lower quadrant. Left percutaneous peritoneal drain seen previously has been removed in the interval. Scarring in the anterior pelvis noted with near complete resolution of extraperitoneal edema in the lower abdomen/pelvis. Musculoskeletal: No worrisome lytic or sclerotic osseous abnormality. IMPRESSION: 1. No evidence for metastatic disease in the chest, abdomen, or pelvis. 2. Interval removal of the left percutaneous peritoneal drain with near complete resolution of extraperitoneal edema in the lower abdomen/pelvis. 3. Left hemicolectomy. 4. Aortic Atherosclerosis (ICD10-I70.0). Electronically Signed   By: EMisty StanleyM.D.   On: 08/28/2020 08:26   DG CHEST PORT 1 VIEW  Result Date: 08/29/2020 CLINICAL DATA:  Post Port-A-Cath insertion EXAM: PORTABLE CHEST 1 VIEW COMPARISON:  Portable exam 0856 hours compared to 03/28/2020 FINDINGS: RIGHT jugular Port-A-Cath with tip projecting over cavoatrial junction.  Enlargement of cardiac silhouette with vascular congestion. Mediastinal contours normal for slight degree of rotation to the LEFT. Decreased lung volumes with mild bibasilar atelectasis. Cannot exclude minimal perihilar infiltrate. No pleural effusion or pneumothorax. Bones unremarkable. IMPRESSION: No pneumothorax following RIGHT jugular Port-A-Cath insertion. Mild bibasilar atelectasis and question minimal perihilar infiltrate. Electronically Signed   By: MLavonia DanaM.D.   On: 08/29/2020 09:10   DG C-Arm 1-60 Min-No Report  Result Date: 08/29/2020 Fluoroscopy was utilized by the requesting physician.  No radiographic interpretation.    Assessment and plan-  1. Chemotherapy-induced thrombocytopenia   2. Malignant neoplasm of sigmoid colon (HCC)   3. Obesity (BMI 35.0-39.9 without  comorbidity)   4. Encounter for antineoplastic chemotherapy   5. Anemia due to antineoplastic chemotherapy   6. Other drug-induced neutropenia ([place])   Cancer Staging Malignant neoplasm of sigmoid colon ([place]) Staging form: Colon and Rectum, AJCC 8th Edition - Clinical: No stage assigned - Unsigned - Pathologic: Stage IIIB (pT3, pN1c, cM0) - Signed by Earlie Server, MD on 02/15/2020  #Stage IIIb colon cancer- 01/30/2020 pT3 pN1c, MMR intact  Status post 1 cycle of FOLFOX.  She tolerates well. Labs reviewed and discussed with patient.  Omit 5-FU bolus-due to cytopenia.  See below  #Anemia, hemoglobin has decreased slightly.  Chemotherapy-induced. #Mild thrombocytopenia, stable.  Monitor #Neutropenia, chemotherapy-induced, ANC 1.3.  Omit 5-FU bolus. #Morbid obesity, recommend exercise as tolerated.  Healthy diet and lifestyle modification Spanish interpretor was present for the entire encounter.  Supportive care measures are necessary for patient well-being and will be provided as necessary. We spent sufficient time to discuss many aspect of care, questions were answered to patient's satisfaction.   Follow-up in 2  week for evaluation prior to cycle 3 treatment.  Repeat CBC in 1 week Earlie Server, MD, PhD Hematology Oncology [facility] Pager- [phone] 09/17/2020

## 2020-09-17 NOTE — Progress Notes (Signed)
Patient tolerated her FOLFOX treatment well today without any complications.

## 2020-09-19 ENCOUNTER — Other Ambulatory Visit: Payer: Self-pay

## 2020-09-19 ENCOUNTER — Inpatient Hospital Stay: Payer: Self-pay

## 2020-09-19 VITALS — BP 115/51 | HR 60 | Temp 98.4°F | Resp 20

## 2020-09-19 DIAGNOSIS — C187 Malignant neoplasm of sigmoid colon: Secondary | ICD-10-CM

## 2020-09-19 MED ORDER — HEPARIN SOD (PORK) LOCK FLUSH 100 UNIT/ML IV SOLN
INTRAVENOUS | Status: AC
  Filled 2020-09-19: qty 5

## 2020-09-19 MED ORDER — SODIUM CHLORIDE 0.9% FLUSH
10.0000 mL | INTRAVENOUS | Status: DC | PRN
Start: 2020-09-19 — End: 2020-09-19
  Administered 2020-09-19: 10 mL
  Filled 2020-09-19: qty 10

## 2020-09-19 MED ORDER — HEPARIN SOD (PORK) LOCK FLUSH 100 UNIT/ML IV SOLN
500.0000 [IU] | Freq: Once | INTRAVENOUS | Status: AC | PRN
  Administered 2020-09-19: 500 [IU]
  Filled 2020-09-19: qty 5

## 2020-09-19 NOTE — Progress Notes (Signed)
1420- Patient here for Fluorouracil Continuous Infusion Pump disconnect. Patient and vital signs stable. Patient discharged to home at this time.

## 2020-10-01 ENCOUNTER — Inpatient Hospital Stay (HOSPITAL_BASED_OUTPATIENT_CLINIC_OR_DEPARTMENT_OTHER): Payer: Self-pay | Admitting: Oncology

## 2020-10-01 ENCOUNTER — Inpatient Hospital Stay: Payer: Self-pay

## 2020-10-01 ENCOUNTER — Encounter: Payer: Self-pay | Admitting: Oncology

## 2020-10-01 VITALS — BP 126/67 | HR 64 | Temp 96.8°F | Resp 18 | Wt 193.2 lb

## 2020-10-01 VITALS — BP 124/74 | HR 61

## 2020-10-01 DIAGNOSIS — T451X5A Adverse effect of antineoplastic and immunosuppressive drugs, initial encounter: Secondary | ICD-10-CM

## 2020-10-01 DIAGNOSIS — M25511 Pain in right shoulder: Secondary | ICD-10-CM

## 2020-10-01 DIAGNOSIS — E669 Obesity, unspecified: Secondary | ICD-10-CM

## 2020-10-01 DIAGNOSIS — C187 Malignant neoplasm of sigmoid colon: Secondary | ICD-10-CM

## 2020-10-01 DIAGNOSIS — D6481 Anemia due to antineoplastic chemotherapy: Secondary | ICD-10-CM

## 2020-10-01 DIAGNOSIS — D702 Other drug-induced agranulocytosis: Secondary | ICD-10-CM

## 2020-10-01 DIAGNOSIS — Z5111 Encounter for antineoplastic chemotherapy: Secondary | ICD-10-CM

## 2020-10-01 LAB — COMPREHENSIVE METABOLIC PANEL
ALT: 17 U/L (ref 0–44)
AST: 28 U/L (ref 15–41)
Albumin: 3.8 g/dL (ref 3.5–5.0)
Alkaline Phosphatase: 105 U/L (ref 38–126)
Anion gap: 7 (ref 5–15)
BUN: 12 mg/dL (ref 6–20)
CO2: 25 mmol/L (ref 22–32)
Calcium: 9.1 mg/dL (ref 8.9–10.3)
Chloride: 102 mmol/L (ref 98–111)
Creatinine, Ser: 0.42 mg/dL — ABNORMAL LOW (ref 0.44–1.00)
GFR, Estimated: >60 mL/min (ref >60)
Glucose, Bld: 153 mg/dL — ABNORMAL HIGH (ref 70–99)
Potassium: 3.6 mmol/L (ref 3.5–5.1)
Sodium: 134 mmol/L — ABNORMAL LOW (ref 135–145)
Total Bilirubin: 0.6 mg/dL (ref 0.3–1.2)
Total Protein: 6.9 g/dL (ref 6.5–8.1)

## 2020-10-01 LAB — CBC WITH DIFFERENTIAL/PLATELET
Abs Immature Granulocytes: 0.01 10*3/uL (ref 0.00–0.07)
Basophils Absolute: 0 10*3/uL (ref 0.0–0.1)
Basophils Relative: 0 %
Eosinophils Absolute: 0.1 10*3/uL (ref 0.0–0.5)
Eosinophils Relative: 2 %
HCT: 33.8 % — ABNORMAL LOW (ref 36.0–46.0)
Hemoglobin: 11.7 g/dL — ABNORMAL LOW (ref 12.0–15.0)
Immature Granulocytes: 0 %
Lymphocytes Relative: 39 %
Lymphs Abs: 1.4 10*3/uL (ref 0.7–4.0)
MCH: 29 pg (ref 26.0–34.0)
MCHC: 34.6 g/dL (ref 30.0–36.0)
MCV: 83.9 fL (ref 80.0–100.0)
Monocytes Absolute: 0.4 10*3/uL (ref 0.1–1.0)
Monocytes Relative: 12 %
Neutro Abs: 1.6 10*3/uL — ABNORMAL LOW (ref 1.7–7.7)
Neutrophils Relative %: 47 %
Platelets: 102 10*3/uL — ABNORMAL LOW (ref 150–400)
RBC: 4.03 MIL/uL (ref 3.87–5.11)
RDW: 13.7 % (ref 11.5–15.5)
WBC: 3.5 10*3/uL — ABNORMAL LOW (ref 4.0–10.5)
nRBC: 0 % (ref 0.0–0.2)

## 2020-10-01 MED ORDER — OXALIPLATIN CHEMO INJECTION 100 MG/20ML
83.0000 mg/m2 | Freq: Once | INTRAVENOUS | Status: AC
  Administered 2020-10-01: 150 mg via INTRAVENOUS
  Filled 2020-10-01: qty 20

## 2020-10-01 MED ORDER — SODIUM CHLORIDE 0.9 % IV SOLN
10.0000 mg | Freq: Once | INTRAVENOUS | Status: AC
  Administered 2020-10-01: 10 mg via INTRAVENOUS
  Filled 2020-10-01: qty 10

## 2020-10-01 MED ORDER — SODIUM CHLORIDE 0.9% FLUSH
10.0000 mL | Freq: Once | INTRAVENOUS | Status: DC
  Filled 2020-10-01: qty 10

## 2020-10-01 MED ORDER — LEUCOVORIN CALCIUM INJECTION 350 MG
410.0000 mg/m2 | Freq: Once | INTRAVENOUS | Status: AC
  Administered 2020-10-01: 750 mg via INTRAVENOUS
  Filled 2020-10-01: qty 25

## 2020-10-01 MED ORDER — FLUOROURACIL CHEMO INJECTION 5 GM/100ML
2400.0000 mg/m2 | INTRAVENOUS | Status: DC
  Administered 2020-10-01: 4400 mg via INTRAVENOUS
  Filled 2020-10-01: qty 88

## 2020-10-01 MED ORDER — DEXTROSE 5 % IV SOLN
Freq: Once | INTRAVENOUS | Status: AC
  Filled 2020-10-01: qty 250

## 2020-10-01 MED ORDER — PALONOSETRON HCL INJECTION 0.25 MG/5ML
0.2500 mg | Freq: Once | INTRAVENOUS | Status: AC
  Administered 2020-10-01: 0.25 mg via INTRAVENOUS
  Filled 2020-10-01: qty 5

## 2020-10-01 MED ORDER — HEPARIN SOD (PORK) LOCK FLUSH 100 UNIT/ML IV SOLN
500.0000 [IU] | Freq: Once | INTRAVENOUS | Status: DC
  Filled 2020-10-01: qty 5

## 2020-10-01 MED ORDER — SODIUM CHLORIDE 0.9% FLUSH
10.0000 mL | INTRAVENOUS | Status: DC | PRN
  Administered 2020-10-01: 10 mL via INTRAVENOUS
  Filled 2020-10-01: qty 10

## 2020-10-01 NOTE — Progress Notes (Signed)
Patient received prescribed treatment in clinic. Tolerated well. Patient stable at discharge. 

## 2020-10-01 NOTE — Progress Notes (Signed)
Hematology/Oncology Progress Note [facility] Telephone:([phone] [phone]  Patient Care Team: [name], MD as PCP - General (Family Medicine) [name], RN as Registered Nurse [name], RN as Registered Nurse   Name of the patient: Debra [name]  [id number]  [birth year]  Date of visit: 10/01/20  PERTINENT ONCOLOGY HISTORY Debra [name] is a 55 y.o.afemale who has above oncology history reviewed by me today presented for follow up visit for management of Stage IIIB Colon cancer seen by me on 11/21/2019 during her hospitalization. Patient has a history of Covid positivity. Severe iron deficiency status post IV Venofer treatment. Colonoscopy 11/21/2019 showed a malignant appearing severe stenosis found in the sigmoid colon at 25 cm proximal to the anus and was nontransversed.  Biopsy was taken.  Surgical pathology is pending  Nonbleeding external hemorrhoids Biopsy was not diagnostic 01/03/2020 virtual colonoscopy showed long segment area of wall thickening and annular constriction within the sigmoid colon in the area of previous concern most compatible with annular malignancy.  Remainder of the colon evaluation is somewhat limited due to large amount of retained stool and incomplete distention proximal to the annular constricting lesion. 01/30/2020 Patient underwent left hemicolectomy by Dr. Celine Ahr and a biopsy showed invasive adenocarcinoma, poorly differentiated.  37 lymph nodes negative for malignancy.  Grade 3, all margins are uninvolved.  Perineural invasion present.  Tumor deposit present. pT3 pN1c cM0 Checks x-ray did not show any lung mass. #Postop, patient developed postop ileus and she required TPN, PICC and NG tube placement.  Postoperation day 9 she continued to have increased leukocytosis and was subsequently found to have a midline wound infection to the inferior portion of the wound.  Patient was treated with  antibiotics.  07/03/2020 CT abdomen pelvis w contrast showed well positioned drainage catheter with apparent resolution of the intra-abdominal abscess. 07/10/2020 seen by [facility] and patient had drainage catheter removed by IR on 07/12/2020   INTERVAL HISTORY Debra [name] is a 55 y.o. female who has above history reviewed by me today presents for follow up visit for management of stage IIIb colon cancer Problems and complaints are listed below: Patient is on adjuvant FOLFOX.  Overall she tolerates well.   Reports right shoulder pain, she cannot raise her right arm.  no fever chills, nausea vomiting.  She reports occasional diarrhea episode, spontaneously resolved.  Review of systems- Review of Systems  Constitutional: Negative for appetite change, chills, fatigue, fever and unexpected weight change.  HENT:   Negative for hearing loss and voice change.   Eyes: Negative for eye problems.  Respiratory: Negative for chest tightness, cough and shortness of breath.   Cardiovascular: Negative for chest pain.  Gastrointestinal: Negative for abdominal distention, abdominal pain, blood in stool and constipation.  Endocrine: Negative for hot flashes.  Genitourinary: Negative for difficulty urinating, dysuria and frequency.   Musculoskeletal: Negative for arthralgias.       Right shoulder pain  Skin: Negative for itching and wound.  Neurological: Negative for extremity weakness, headaches and light-headedness.  Hematological: Negative for adenopathy.  Psychiatric/Behavioral: Negative for confusion.    No Known Allergies  Patient Active Problem List   Diagnosis Date Noted  . Encounter for antineoplastic chemotherapy 09/03/2020  . Encounter for central line placement   . Loss of weight 02/15/2020  . Goals of care, counseling/discussion 02/15/2020  . Malignant neoplasm of sigmoid colon ([place]) 02/15/2020  . Pressure injury of skin 02/03/2020  . Bowel obstruction ([place]) 01/29/2020  .  Iron  deficiency anemia due to chronic blood loss 11/22/2019  . COVID-19 virus detected 11/20/2019  . Symptomatic anemia 11/20/2019  . Hypothyroidism 11/20/2019  . Iron deficiency anemia 11/20/2019  . GIB (gastrointestinal bleeding) 11/20/2019     Past Medical History:  Diagnosis Date  . Anemia   . Colon cancer ([place]) 04/2020  . Hypothyroidism   . Thyroid disease      Past Surgical History:  Procedure Laterality Date  . COLONOSCOPY WITH PROPOFOL N/A 11/21/2019   Procedure: COLONOSCOPY WITH PROPOFOL;  Surgeon: Lin Landsman, MD;  Location: [facility] ENDOSCOPY;  Service: Gastroenterology;  Laterality: N/A;  . CYSTOSCOPY WITH STENT PLACEMENT Left 01/30/2020   Procedure: CYSTOSCOPY WITH STENT PLACEMENT;  Surgeon: Hollice Espy, MD;  Location: [facility] ORS;  Service: Urology;  Laterality: Left;  . ESOPHAGOGASTRODUODENOSCOPY (EGD) WITH PROPOFOL N/A 11/21/2019   Procedure: ESOPHAGOGASTRODUODENOSCOPY (EGD) WITH PROPOFOL;  Surgeon: Lin Landsman, MD;  Location: [facility] ENDOSCOPY;  Service: Gastroenterology;  Laterality: N/A;  . IR RADIOLOGIST EVAL & MGMT  07/03/2020  . IR RADIOLOGIST EVAL & MGMT  07/12/2020  . PARTIAL COLECTOMY N/A 01/30/2020   Procedure: PARTIAL COLECTOMY Sigmoid;  Surgeon: [name], MD;  Location: [place] ORS;  Service: General;  Laterality: N/A;  . PORTACATH PLACEMENT Left 03/28/2020   Procedure: INSERTION PORT-A-CATH;  Surgeon: [name], MD;  Location: [facility] ORS;  Service: General;  Laterality: Left;  . PORTACATH PLACEMENT N/A 08/29/2020   Procedure: INSERTION PORT-A-CATH, chemotherapy;  Surgeon: [name], MD;  Location: [facility] ORS;  Service: General;  Laterality: N/A;    Social History   Socioeconomic History  . Marital status: Single    Spouse name: Not on file  . Number of children: Not on file  . Years of education: Not on file  . Highest education level: Not on file  Occupational History  . Occupation: Factory  Tobacco Use  . Smoking status:  Never Smoker  . Smokeless tobacco: Never Used  Vaping Use  . Vaping Use: Never used  Substance and Sexual Activity  . Alcohol use: Never  . Drug use: Never  . Sexual activity: Not on file  Other Topics Concern  . Not on file  Social History Narrative   Lives at home with significant other   Social Determinants of Health   Financial Resource Strain: Not on file  Food Insecurity: Not on file  Transportation Needs: Not on file  Physical Activity: Not on file  Stress: Not on file  Social Connections: Not on file  Intimate Partner Violence: Not on file     Family History  Problem Relation Age of Onset  . Diabetes Sister      Current Outpatient Medications:  .  B-D 3CC LUER-LOK SYR 25GX1" 25G X 1" 3 ML MISC, Use with B12 injection, Disp: , Rfl:  .  cyanocobalamin (,VITAMIN B-12,) 1000 MCG/ML injection, Inject 1,000 mcg into the muscle every 30 (thirty) days., Disp: , Rfl:  .  docusate sodium (COLACE) 100 MG capsule, Take 1 capsule (100 mg total) by mouth daily., Disp: 30 capsule, Rfl: 1 .  ferrous sulfate 325 (65 FE) MG tablet, Take 1 tablet (325 mg total) by mouth 2 (two) times daily with a meal. (Patient not taking: No sig reported), Disp: 60 tablet, Rfl: 3 .  hydrocortisone cream 0.5 %, Apply 1 application topically 2 (two) times daily. Until rash is healed., Disp: 30 g, Rfl: 0 .  ibuprofen (ADVIL) 800 MG tablet, Take 1 tablet (800 mg total) by mouth every  8 (eight) hours as needed., Disp: 30 tablet, Rfl: 0 .  levothyroxine (SYNTHROID) 137 MCG tablet, Take 137 mcg by mouth daily before breakfast. , Disp: , Rfl:  .  lidocaine-prilocaine (EMLA) cream, Apply to affected area once, Disp: 30 g, Rfl: 3 .  magic mouthwash w/lidocaine SOLN, Take 5 mLs by mouth 4 (four) times daily as needed for mouth pain. Sig: Swish/Spit 5-10 ml four times a day as needed. Dispense 480 ml. 1RF, Disp: 480 mL, Rfl: 1 .  ondansetron (ZOFRAN) 8 MG tablet, Take 1 tablet (8 mg total) by mouth 2 (two) times  daily as needed for refractory nausea / vomiting. Start on day 3 after chemotherapy. (Patient not taking: Reported on 09/17/2020), Disp: 30 tablet, Rfl: 1 .  prochlorperazine (COMPAZINE) 10 MG tablet, Take 1 tablet (10 mg total) by mouth every 6 (six) hours as needed (Nausea or vomiting). (Patient not taking: Reported on 09/17/2020), Disp: 30 tablet, Rfl: 1 No current facility-administered medications for this visit.  Facility-Administered Medications Ordered in Other Visits:  .  sodium chloride flush (NS) 0.9 % injection 10 mL, 10 mL, Intravenous, Once, Earlie Server, MD   Physical exam:  There were no vitals filed for this visit. Physical Exam Constitutional:      General: She is not in acute distress.    Appearance: She is obese.     Comments: Patient walks independently  HENT:     Head: Normocephalic and atraumatic.     Mouth/Throat:     Pharynx: No oropharyngeal exudate.  Eyes:     General: No scleral icterus.    Pupils: Pupils are equal, round, and reactive to light.  Cardiovascular:     Rate and Rhythm: Normal rate and regular rhythm.     Heart sounds: No murmur heard.   Pulmonary:     Effort: Pulmonary effort is normal. No respiratory distress.  Abdominal:     General: There is no distension.     Palpations: Abdomen is soft.     Tenderness: There is no abdominal tenderness.  Musculoskeletal:        General: Normal range of motion.     Cervical back: Normal range of motion and neck supple.  Skin:    General: Skin is warm and dry.     Findings: No erythema.     Comments: Right side anterior chest wall + Mediport  Neurological:     Mental Status: She is alert and oriented to person, place, and time.  Psychiatric:        Mood and Affect: Affect normal.        CMP Latest Ref Rng & Units 09/17/2020  Glucose 70 - 99 mg/dL 137(H)  BUN 6 - 20 mg/dL 12  Creatinine 0.44 - 1.00 mg/dL 0.34(L)  Sodium 135 - 145 mmol/L 136  Potassium 3.5 - 5.1 mmol/L 3.7  Chloride 98 - 111  mmol/L 104  CO2 22 - 32 mmol/L 24  Calcium 8.9 - 10.3 mg/dL 8.8(L)  Total Protein 6.5 - 8.1 g/dL 6.8  Total Bilirubin 0.3 - 1.2 mg/dL 0.6  Alkaline Phos 38 - 126 U/L 94  AST 15 - 41 U/L 27  ALT 0 - 44 U/L 19   CBC Latest Ref Rng & Units 09/17/2020  WBC 4.0 - 10.5 K/uL 3.2(L)  Hemoglobin 12.0 - 15.0 g/dL 10.9(L)  Hematocrit 36.0 - 46.0 % 31.8(L)  Platelets 150 - 400 K/uL 139(L)    RADIOGRAPHIC STUDIES: I have personally reviewed the radiological images as listed  and agreed with the findings in the report. CT ABDOMEN PELVIS W CONTRAST  Result Date: 07/03/2020 CLINICAL DATA:  55 year old female with a history of colic cancer status post left hemicolectomy in August of 4268 complicated by intra-abdominal abscess formation. Initially treated on 04/27/2020 the first drainage catheter was removed after resolution of the abscess cavity. Unfortunately, patient developed recurrent abscess formation 5 weeks later. A second percutaneous drainage catheter was placed on 06/08/2020. Patient presents today for follow-up evaluation. EXAM: CT ABDOMEN AND PELVIS WITH CONTRAST TECHNIQUE: Multidetector CT imaging of the abdomen and pelvis was performed using the standard protocol following bolus administration of intravenous contrast. CONTRAST:  151m ISOVUE-300 IOPAMIDOL (ISOVUE-300) INJECTION 61% COMPARISON:  CT abdomen/pelvis 06/07/2020 FINDINGS: Lower chest: Linear atelectasis versus scarring in the left lower lobe, unchanged. Otherwise, no focal abnormality in the chest. Hepatobiliary: Low attenuation of the hepatic parenchyma consistent with hepatic steatosis. Geographic hypoattenuation of a greater degree in the left hemi-liver adjacent to the fissure for the falciform ligament is nonspecific but most suggestive of benign focal fatty infiltration. Otherwise, no discrete hepatic lesion identified. Gallbladder is unremarkable. No intra or extrahepatic biliary ductal dilatation. Pancreas: Unremarkable. No  pancreatic ductal dilatation or surrounding inflammatory changes. Spleen: Normal in size without focal abnormality. Adrenals/Urinary Tract: Adrenal glands are unremarkable. Kidneys are normal, without renal calculi, focal lesion, or hydronephrosis. Bladder is unremarkable. Stomach/Bowel: Surgical changes of prior left hemicolectomy. No focal bowel wall thickening or evidence of obstruction. Vascular/Lymphatic: No significant vascular findings are present. No enlarged abdominal or pelvic lymph nodes. Reproductive: Uterus and bilateral adnexa are unremarkable. Other: Left lower quadrant percutaneous drainage catheter in good position. Interval resolution of the previously identified fluid and gas collection. Persistent linear soft tissue stranding extending from the pigtail of the catheter inferiorly toward the bladder dome consistent with a region of residual granulation tissue/scarring. No ascites. No undrained intra-abdominal fluid collections. Musculoskeletal: No acute or significant osseous findings. IMPRESSION: 1. Well-positioned drainage catheter with apparent resolution of the intra-abdominal abscess. 2. No evidence of ascites or undrained fluid collection. Electronically Signed   By: HJacqulynn CadetM.D.   On: 07/03/2020 15:51   CT CHEST ABDOMEN PELVIS W CONTRAST  Result Date: 08/28/2020 CLINICAL DATA:  Colon cancer.  Restaging. EXAM: CT CHEST, ABDOMEN, AND PELVIS WITH CONTRAST TECHNIQUE: Multidetector CT imaging of the chest, abdomen and pelvis was performed following the standard protocol during bolus administration of intravenous contrast. CONTRAST:  1034mOMNIPAQUE IOHEXOL 300 MG/ML  SOLN COMPARISON:  Abdomen/pelvis CT 07/03/2020 FINDINGS: CT CHEST FINDINGS Cardiovascular: The heart size is normal. No substantial pericardial effusion. No thoracic aortic aneurysm. Mediastinum/Nodes: No mediastinal lymphadenopathy. There is no hilar lymphadenopathy. The esophagus has normal imaging features. There is  no axillary lymphadenopathy. Lungs/Pleura: No suspicious pulmonary nodule or mass. No focal airspace consolidation. Subsegmental atelectasis noted posterior left base. No pleural effusion. Musculoskeletal: No worrisome lytic or sclerotic osseous abnormality. CT ABDOMEN PELVIS FINDINGS Hepatobiliary: No suspicious focal abnormality within the liver parenchyma. There is no evidence for gallstones, gallbladder wall thickening, or pericholecystic fluid. No intrahepatic or extrahepatic biliary dilation. Pancreas: No focal mass lesion. No dilatation of the main duct. No intraparenchymal cyst. No peripancreatic edema. Spleen: No splenomegaly. No focal mass lesion. Adrenals/Urinary Tract: No adrenal nodule or mass. Kidneys unremarkable. No evidence for hydroureter. The urinary bladder appears normal for the degree of distention. Stomach/Bowel: Stomach is unremarkable. No gastric wall thickening. No evidence of outlet obstruction. Duodenum is normally positioned as is the ligament of Treitz. No small bowel wall  thickening. No small bowel dilatation. The terminal ileum is normal. The appendix is normal. Left hemicolectomy. Vascular/Lymphatic: No abdominal aortic aneurysm. There is no gastrohepatic or hepatoduodenal ligament lymphadenopathy. No retroperitoneal or mesenteric lymphadenopathy. No pelvic sidewall lymphadenopathy. Reproductive: The uterus is unremarkable.  There is no adnexal mass. Other: No intraperitoneal free fluid. Scarring noted right lower quadrant. Left percutaneous peritoneal drain seen previously has been removed in the interval. Scarring in the anterior pelvis noted with near complete resolution of extraperitoneal edema in the lower abdomen/pelvis. Musculoskeletal: No worrisome lytic or sclerotic osseous abnormality. IMPRESSION: 1. No evidence for metastatic disease in the chest, abdomen, or pelvis. 2. Interval removal of the left percutaneous peritoneal drain with near complete resolution of  extraperitoneal edema in the lower abdomen/pelvis. 3. Left hemicolectomy. 4. Aortic Atherosclerosis (ICD10-I70.0). Electronically Signed   By: Misty Stanley M.D.   On: 08/28/2020 08:26   DG Sinus/Fist Tube Chk-Non GI  Result Date: 07/03/2020 INDICATION: Left lower quadrant drainage catheter in place. Evaluate for fistulous communication with bowel. EXAM: Drain injection MEDICATIONS: None. ANESTHESIA/SEDATION: None. COMPLICATIONS: None immediate. PROCEDURE: I hand injection of contrast material was performed. Contrast opacifies the collapsed abscess cavity and tracks slightly inferior and posterior within soft tissue planes. There is no evidence of fistulous communication with bowel. Additional injected contrast material tracks back along the catheter tubing and exits the skin surface. IMPRESSION: 1. Collapsed abscess cavity. 2. Some tracking of injected contrast material along soft tissue planes without evidence of fistula. Electronically Signed   By: Jacqulynn Cadet M.D.   On: 07/03/2020 15:53   DG CHEST PORT 1 VIEW  Result Date: 08/29/2020 CLINICAL DATA:  Post Port-A-Cath insertion EXAM: PORTABLE CHEST 1 VIEW COMPARISON:  Portable exam 0856 hours compared to 03/28/2020 FINDINGS: RIGHT jugular Port-A-Cath with tip projecting over cavoatrial junction. Enlargement of cardiac silhouette with vascular congestion. Mediastinal contours normal for slight degree of rotation to the LEFT. Decreased lung volumes with mild bibasilar atelectasis. Cannot exclude minimal perihilar infiltrate. No pleural effusion or pneumothorax. Bones unremarkable. IMPRESSION: No pneumothorax following RIGHT jugular Port-A-Cath insertion. Mild bibasilar atelectasis and question minimal perihilar infiltrate. Electronically Signed   By: Lavonia Dana M.D.   On: 08/29/2020 09:10   DG C-Arm 1-60 Min-No Report  Result Date: 08/29/2020 Fluoroscopy was utilized by the requesting physician.  No radiographic interpretation.   IR  Radiologist Eval & Mgmt  Result Date: 07/12/2020 Please refer to notes tab for details about interventional procedure. (Op Note)  IR Radiologist Eval & Mgmt  Result Date: 07/03/2020 Please refer to notes tab for details about interventional procedure. (Op Note)   Assessment and plan-  1. Malignant neoplasm of sigmoid colon ([place])   2. Encounter for antineoplastic chemotherapy   3. Anemia due to antineoplastic chemotherapy   4. Obesity (BMI 35.0-39.9 without comorbidity)   5. Other drug-induced neutropenia ([place])   6. Acute pain of right shoulder   Cancer Staging Malignant neoplasm of sigmoid colon ([place]) Staging form: Colon and Rectum, AJCC 8th Edition - Clinical: No stage assigned - Unsigned - Pathologic: Stage IIIB (pT3, pN1c, cM0) - Signed by Earlie Server, MD on 02/15/2020  #Stage IIIb colon cancer- 01/30/2020 pT3 pN1c, MMR intact  On adjuvant FOLFOX, bolus 5-FU omitted starting cycle 2.  She tolerates well. Labs reviewed and discussed with patient. Okay to proceed with FOLFOX-with no 5-FU bolus.   #Anemia, hemoglobin has decreased slightly.  Chemotherapy-induced.  Stable. #Mild thrombocytopenia, stable.  Stable #Neutropenia, chemotherapy-induced.  Stable #Morbid obesity, recommend exercise as tolerated.  Healthy diet and lifestyle modification #Right shoulder pain, likely capsulitis.  Advised patient to take over-the-counter ibuprofen 2-3 times daily as needed.  If no improvement, she needs to talk to her primary care provider for further evaluation and management.  Spanish interpretor was present for the entire encounter.  Supportive care measures are necessary for patient well-being and will be provided as necessary. We spent sufficient time to discuss many aspect of care, questions were answered to patient's satisfaction.   Follow-up in 2 week for evaluation prior to next cycle treatments Earlie Server, MD, PhD Hematology Oncology [facility] Pager- [phone] 10/01/2020

## 2020-10-01 NOTE — Progress Notes (Signed)
Patient here for follow up. Pt reports that her fingertips get black or white and cold. Pt also reports occasional pain to right arm.

## 2020-10-03 ENCOUNTER — Other Ambulatory Visit: Payer: Self-pay

## 2020-10-03 ENCOUNTER — Inpatient Hospital Stay: Payer: Self-pay

## 2020-10-03 VITALS — BP 116/78 | HR 69 | Temp 96.6°F

## 2020-10-03 DIAGNOSIS — C187 Malignant neoplasm of sigmoid colon: Secondary | ICD-10-CM

## 2020-10-03 MED ORDER — HEPARIN SOD (PORK) LOCK FLUSH 100 UNIT/ML IV SOLN
INTRAVENOUS | Status: AC
  Filled 2020-10-03: qty 5

## 2020-10-03 MED ORDER — SODIUM CHLORIDE 0.9% FLUSH
10.0000 mL | INTRAVENOUS | Status: DC | PRN
  Administered 2020-10-03: 10 mL
  Filled 2020-10-03: qty 10

## 2020-10-03 MED ORDER — HEPARIN SOD (PORK) LOCK FLUSH 100 UNIT/ML IV SOLN
500.0000 [IU] | Freq: Once | INTRAVENOUS | Status: AC | PRN
  Administered 2020-10-03: 500 [IU]
  Filled 2020-10-03: qty 5

## 2020-10-15 ENCOUNTER — Inpatient Hospital Stay: Payer: Self-pay

## 2020-10-15 ENCOUNTER — Encounter: Payer: Self-pay | Admitting: Oncology

## 2020-10-15 ENCOUNTER — Inpatient Hospital Stay (HOSPITAL_BASED_OUTPATIENT_CLINIC_OR_DEPARTMENT_OTHER): Payer: Self-pay | Admitting: Oncology

## 2020-10-15 ENCOUNTER — Inpatient Hospital Stay: Payer: Self-pay | Attending: Oncology

## 2020-10-15 VITALS — BP 132/82 | HR 66 | Temp 97.7°F | Resp 16 | Wt 194.4 lb

## 2020-10-15 DIAGNOSIS — C187 Malignant neoplasm of sigmoid colon: Secondary | ICD-10-CM

## 2020-10-15 DIAGNOSIS — E669 Obesity, unspecified: Secondary | ICD-10-CM

## 2020-10-15 DIAGNOSIS — D6481 Anemia due to antineoplastic chemotherapy: Secondary | ICD-10-CM

## 2020-10-15 DIAGNOSIS — T451X5A Adverse effect of antineoplastic and immunosuppressive drugs, initial encounter: Secondary | ICD-10-CM | POA: Insufficient documentation

## 2020-10-15 DIAGNOSIS — Z5111 Encounter for antineoplastic chemotherapy: Secondary | ICD-10-CM

## 2020-10-15 DIAGNOSIS — Z79899 Other long term (current) drug therapy: Secondary | ICD-10-CM | POA: Insufficient documentation

## 2020-10-15 DIAGNOSIS — B372 Candidiasis of skin and nail: Secondary | ICD-10-CM

## 2020-10-15 DIAGNOSIS — Z8616 Personal history of COVID-19: Secondary | ICD-10-CM | POA: Insufficient documentation

## 2020-10-15 DIAGNOSIS — D509 Iron deficiency anemia, unspecified: Secondary | ICD-10-CM | POA: Insufficient documentation

## 2020-10-15 LAB — COMPREHENSIVE METABOLIC PANEL
ALT: 21 U/L (ref 0–44)
AST: 33 U/L (ref 15–41)
Albumin: 3.9 g/dL (ref 3.5–5.0)
Alkaline Phosphatase: 102 U/L (ref 38–126)
Anion gap: 7 (ref 5–15)
BUN: 15 mg/dL (ref 6–20)
CO2: 26 mmol/L (ref 22–32)
Calcium: 9.4 mg/dL (ref 8.9–10.3)
Chloride: 103 mmol/L (ref 98–111)
Creatinine, Ser: 0.44 mg/dL (ref 0.44–1.00)
GFR, Estimated: >60 mL/min (ref >60)
Glucose, Bld: 148 mg/dL — ABNORMAL HIGH (ref 70–99)
Potassium: 3.9 mmol/L (ref 3.5–5.1)
Sodium: 136 mmol/L (ref 135–145)
Total Bilirubin: 0.7 mg/dL (ref 0.3–1.2)
Total Protein: 7.2 g/dL (ref 6.5–8.1)

## 2020-10-15 LAB — CBC WITH DIFFERENTIAL/PLATELET
Abs Immature Granulocytes: 0.01 10*3/uL (ref 0.00–0.07)
Basophils Absolute: 0 10*3/uL (ref 0.0–0.1)
Basophils Relative: 0 %
Eosinophils Absolute: 0.1 10*3/uL (ref 0.0–0.5)
Eosinophils Relative: 2 %
HCT: 34.9 % — ABNORMAL LOW (ref 36.0–46.0)
Hemoglobin: 12 g/dL (ref 12.0–15.0)
Immature Granulocytes: 0 %
Lymphocytes Relative: 35 %
Lymphs Abs: 1.4 10*3/uL (ref 0.7–4.0)
MCH: 29.1 pg (ref 26.0–34.0)
MCHC: 34.4 g/dL (ref 30.0–36.0)
MCV: 84.7 fL (ref 80.0–100.0)
Monocytes Absolute: 0.6 10*3/uL (ref 0.1–1.0)
Monocytes Relative: 15 %
Neutro Abs: 1.9 10*3/uL (ref 1.7–7.7)
Neutrophils Relative %: 48 %
Platelets: 105 10*3/uL — ABNORMAL LOW (ref 150–400)
RBC: 4.12 MIL/uL (ref 3.87–5.11)
RDW: 14.3 % (ref 11.5–15.5)
WBC: 3.9 10*3/uL — ABNORMAL LOW (ref 4.0–10.5)
nRBC: 0 % (ref 0.0–0.2)

## 2020-10-15 MED ORDER — SODIUM CHLORIDE 0.9 % IV SOLN
10.0000 mg | Freq: Once | INTRAVENOUS | Status: AC
  Administered 2020-10-15: 10 mg via INTRAVENOUS
  Filled 2020-10-15: qty 10

## 2020-10-15 MED ORDER — OXALIPLATIN CHEMO INJECTION 100 MG/20ML
82.0000 mg/m2 | Freq: Once | INTRAVENOUS | Status: AC
  Administered 2020-10-15: 150 mg via INTRAVENOUS
  Filled 2020-10-15: qty 10

## 2020-10-15 MED ORDER — SODIUM CHLORIDE 0.9% FLUSH
10.0000 mL | Freq: Once | INTRAVENOUS | Status: AC
  Administered 2020-10-15: 10 mL via INTRAVENOUS
  Filled 2020-10-15: qty 10

## 2020-10-15 MED ORDER — NYSTATIN 100000 UNIT/GM EX POWD: 1.0000 "application " | Freq: Three times a day (TID) | CUTANEOUS | 0 refills | Status: DC

## 2020-10-15 MED ORDER — LEUCOVORIN CALCIUM INJECTION 350 MG
410.0000 mg/m2 | Freq: Once | INTRAVENOUS | Status: AC
  Administered 2020-10-15: 750 mg via INTRAVENOUS
  Filled 2020-10-15: qty 25

## 2020-10-15 MED ORDER — PALONOSETRON HCL INJECTION 0.25 MG/5ML
0.2500 mg | Freq: Once | INTRAVENOUS | Status: AC
  Administered 2020-10-15: 0.25 mg via INTRAVENOUS
  Filled 2020-10-15: qty 5

## 2020-10-15 MED ORDER — FLUOROURACIL CHEMO INJECTION 5 GM/100ML
2400.0000 mg/m2 | INTRAVENOUS | Status: DC
  Administered 2020-10-15: 4400 mg via INTRAVENOUS
  Filled 2020-10-15: qty 88

## 2020-10-15 MED ORDER — DEXTROSE 5 % IV SOLN
Freq: Once | INTRAVENOUS | Status: AC
  Filled 2020-10-15: qty 250

## 2020-10-15 NOTE — Progress Notes (Signed)
Hematology/Oncology Progress Note [facility] Telephone:([phone] [phone]  Patient Care Team: [name], MD as PCP - General (Family Medicine) [name], RN as Registered Nurse [name], RN as Registered Nurse   Name of the patient: Debra [name]  [id number]  05-Dec-1965  Date of visit: 10/15/20  PERTINENT ONCOLOGY HISTORY Debra [name] is a 55 y.o.afemale who has above oncology history reviewed by me today presented for follow up visit for management of Stage IIIB Colon cancer seen by me on 11/21/2019 during her hospitalization. Patient has a history of Covid positivity. Severe iron deficiency status post IV Venofer treatment. Colonoscopy 11/21/2019 showed a malignant appearing severe stenosis found in the sigmoid colon at 25 cm proximal to the anus and was nontransversed.  Biopsy was taken.  Surgical pathology is pending  Nonbleeding external hemorrhoids Biopsy was not diagnostic 01/03/2020 virtual colonoscopy showed long segment area of wall thickening and annular constriction within the sigmoid colon in the area of previous concern most compatible with annular malignancy.  Remainder of the colon evaluation is somewhat limited due to large amount of retained stool and incomplete distention proximal to the annular constricting lesion. 01/30/2020 Patient underwent left hemicolectomy by Dr. Celine Ahr and a biopsy showed invasive adenocarcinoma, poorly differentiated.  37 lymph nodes negative for malignancy.  Grade 3, all margins are uninvolved.  Perineural invasion present.  Tumor deposit present. pT3 pN1c cM0 Checks x-ray did not show any lung mass. #Postop, patient developed postop ileus and she required TPN, PICC and NG tube placement.  Postoperation day 9 she continued to have increased leukocytosis and was subsequently found to have a midline wound infection to the inferior portion of the wound.  Patient was treated with  antibiotics.  07/03/2020 CT abdomen pelvis w contrast showed well positioned drainage catheter with apparent resolution of the intra-abdominal abscess. 07/10/2020 seen by [facility] and patient had drainage catheter removed by IR on 07/12/2020   INTERVAL HISTORY Debra [name] is a 55 y.o. female who has above history reviewed by me today presents for follow up visit for management of stage IIIb colon cancer Problems and complaints are listed below: Patient is on adjuvant FOLFOX.  Overall she tolerates well She has noticed that " fingertips turn white and also numb" when exposed to cold.  Some numbness of her toes as well. Left axillary rash  Review of systems- Review of Systems  Constitutional: Negative for appetite change, chills, fatigue, fever and unexpected weight change.  HENT:   Negative for hearing loss and voice change.   Eyes: Negative for eye problems.  Respiratory: Negative for chest tightness, cough and shortness of breath.   Cardiovascular: Negative for chest pain.  Gastrointestinal: Negative for abdominal distention, abdominal pain, blood in stool and constipation.  Endocrine: Negative for hot flashes.  Genitourinary: Negative for difficulty urinating, dysuria and frequency.   Musculoskeletal: Negative for arthralgias.       Right shoulder pain  Skin: Positive for rash. Negative for itching and wound.  Neurological: Negative for extremity weakness, headaches and light-headedness.  Hematological: Negative for adenopathy.  Psychiatric/Behavioral: Negative for confusion.    No Known Allergies  Patient Active Problem List   Diagnosis Date Noted  . Encounter for antineoplastic chemotherapy 09/03/2020  . Encounter for central line placement   . Loss of weight 02/15/2020  . Goals of care, counseling/discussion 02/15/2020  . Malignant neoplasm of sigmoid colon ([place]) 02/15/2020  . Pressure injury of skin 02/03/2020  . Bowel  obstruction ([place]) 01/29/2020  . Iron  deficiency anemia due to chronic blood loss 11/22/2019  . COVID-19 virus detected 11/20/2019  . Symptomatic anemia 11/20/2019  . Hypothyroidism 11/20/2019  . Iron deficiency anemia 11/20/2019  . GIB (gastrointestinal bleeding) 11/20/2019     Past Medical History:  Diagnosis Date  . Anemia   . Colon cancer ([place]) 04/2020  . Hypothyroidism   . Thyroid disease      Past Surgical History:  Procedure Laterality Date  . COLONOSCOPY WITH PROPOFOL N/A 11/21/2019   Procedure: COLONOSCOPY WITH PROPOFOL;  Surgeon: Lin Landsman, MD;  Location: [facility] ENDOSCOPY;  Service: Gastroenterology;  Laterality: N/A;  . CYSTOSCOPY WITH STENT PLACEMENT Left 01/30/2020   Procedure: CYSTOSCOPY WITH STENT PLACEMENT;  Surgeon: Hollice Espy, MD;  Location: [facility] ORS;  Service: Urology;  Laterality: Left;  . ESOPHAGOGASTRODUODENOSCOPY (EGD) WITH PROPOFOL N/A 11/21/2019   Procedure: ESOPHAGOGASTRODUODENOSCOPY (EGD) WITH PROPOFOL;  Surgeon: Lin Landsman, MD;  Location: [facility] ENDOSCOPY;  Service: Gastroenterology;  Laterality: N/A;  . IR RADIOLOGIST EVAL & MGMT  07/03/2020  . IR RADIOLOGIST EVAL & MGMT  07/12/2020  . PARTIAL COLECTOMY N/A 01/30/2020   Procedure: PARTIAL COLECTOMY Sigmoid;  Surgeon: [name], MD;  Location: [place] ORS;  Service: General;  Laterality: N/A;  . PORTACATH PLACEMENT Left 03/28/2020   Procedure: INSERTION PORT-A-CATH;  Surgeon: [name], MD;  Location: [facility] ORS;  Service: General;  Laterality: Left;  . PORTACATH PLACEMENT N/A 08/29/2020   Procedure: INSERTION PORT-A-CATH, chemotherapy;  Surgeon: [name], MD;  Location: [facility] ORS;  Service: General;  Laterality: N/A;    Social History   Socioeconomic History  . Marital status: Single    Spouse name: Not on file  . Number of children: Not on file  . Years of education: Not on file  . Highest education level: Not on file  Occupational History  . Occupation: Factory  Tobacco Use  . Smoking status:  Never Smoker  . Smokeless tobacco: Never Used  Vaping Use  . Vaping Use: Never used  Substance and Sexual Activity  . Alcohol use: Never  . Drug use: Never  . Sexual activity: Not on file  Other Topics Concern  . Not on file  Social History Narrative   Lives at home with significant other   Social Determinants of Health   Financial Resource Strain: Not on file  Food Insecurity: Not on file  Transportation Needs: Not on file  Physical Activity: Not on file  Stress: Not on file  Social Connections: Not on file  Intimate Partner Violence: Not on file     Family History  Problem Relation Age of Onset  . Diabetes Sister      Current Outpatient Medications:  .  B-D 3CC LUER-LOK SYR 25GX1" 25G X 1" 3 ML MISC, Use with B12 injection, Disp: , Rfl:  .  cyanocobalamin (,VITAMIN B-12,) 1000 MCG/ML injection, Inject 1,000 mcg into the muscle every 30 (thirty) days., Disp: , Rfl:  .  docusate sodium (COLACE) 100 MG capsule, Take 1 capsule (100 mg total) by mouth daily., Disp: 30 capsule, Rfl: 1 .  ferrous sulfate 325 (65 FE) MG tablet, Take 1 tablet (325 mg total) by mouth 2 (two) times daily with a meal. (Patient not taking: No sig reported), Disp: 60 tablet, Rfl: 3 .  hydrocortisone cream 0.5 %, Apply 1 application topically 2 (two) times daily. Until rash is healed., Disp: 30 g, Rfl: 0 .  ibuprofen (ADVIL) 800 MG tablet, Take 1 tablet (800  mg total) by mouth every 8 (eight) hours as needed., Disp: 30 tablet, Rfl: 0 .  levothyroxine (SYNTHROID) 137 MCG tablet, Take 137 mcg by mouth daily before breakfast. , Disp: , Rfl:  .  lidocaine-prilocaine (EMLA) cream, Apply to affected area once, Disp: 30 g, Rfl: 3 .  magic mouthwash w/lidocaine SOLN, Take 5 mLs by mouth 4 (four) times daily as needed for mouth pain. Sig: Swish/Spit 5-10 ml four times a day as needed. Dispense 480 ml. 1RF, Disp: 480 mL, Rfl: 1 .  ondansetron (ZOFRAN) 8 MG tablet, Take 1 tablet (8 mg total) by mouth 2 (two) times  daily as needed for refractory nausea / vomiting. Start on day 3 after chemotherapy. (Patient not taking: Reported on 10/01/2020), Disp: 30 tablet, Rfl: 1 .  prochlorperazine (COMPAZINE) 10 MG tablet, Take 1 tablet (10 mg total) by mouth every 6 (six) hours as needed (Nausea or vomiting). (Patient not taking: No sig reported), Disp: 30 tablet, Rfl: 1   Physical exam:  Vitals:   10/15/20 0838  BP: 132/82  Pulse: 66  Resp: 16  Temp: 97.7 F (36.5 C)  Weight: 194 lb 6.4 oz (88.2 kg)   Physical Exam Constitutional:      General: She is not in acute distress.    Appearance: She is obese.     Comments: Patient walks independently  HENT:     Head: Normocephalic and atraumatic.     Mouth/Throat:     Pharynx: No oropharyngeal exudate.  Eyes:     General: No scleral icterus.    Pupils: Pupils are equal, round, and reactive to light.  Cardiovascular:     Rate and Rhythm: Normal rate and regular rhythm.     Heart sounds: No murmur heard.   Pulmonary:     Effort: Pulmonary effort is normal. No respiratory distress.  Abdominal:     General: There is no distension.     Palpations: Abdomen is soft.     Tenderness: There is no abdominal tenderness.  Musculoskeletal:        General: Normal range of motion.     Cervical back: Normal range of motion and neck supple.  Skin:    General: Skin is warm and dry.     Findings: No erythema.     Comments: Right side anterior chest wall + Mediport  Neurological:     Mental Status: She is alert and oriented to person, place, and time.  Psychiatric:        Mood and Affect: Affect normal.   left axillary rash     CMP Latest Ref Rng & Units 10/01/2020  Glucose 70 - 99 mg/dL 153(H)  BUN 6 - 20 mg/dL 12  Creatinine 0.44 - 1.00 mg/dL 0.42(L)  Sodium 135 - 145 mmol/L 134(L)  Potassium 3.5 - 5.1 mmol/L 3.6  Chloride 98 - 111 mmol/L 102  CO2 22 - 32 mmol/L 25  Calcium 8.9 - 10.3 mg/dL 9.1  Total Protein 6.5 - 8.1 g/dL 6.9  Total Bilirubin 0.3 -  1.2 mg/dL 0.6  Alkaline Phos 38 - 126 U/L 105  AST 15 - 41 U/L 28  ALT 0 - 44 U/L 17   CBC Latest Ref Rng & Units 10/01/2020  WBC 4.0 - 10.5 K/uL 3.5(L)  Hemoglobin 12.0 - 15.0 g/dL 11.7(L)  Hematocrit 36.0 - 46.0 % 33.8(L)  Platelets 150 - 400 K/uL 102(L)    RADIOGRAPHIC STUDIES: I have personally reviewed the radiological images as listed and agreed with the  findings in the report. CT CHEST ABDOMEN PELVIS W CONTRAST  Result Date: 08/28/2020 CLINICAL DATA:  Colon cancer.  Restaging. EXAM: CT CHEST, ABDOMEN, AND PELVIS WITH CONTRAST TECHNIQUE: Multidetector CT imaging of the chest, abdomen and pelvis was performed following the standard protocol during bolus administration of intravenous contrast. CONTRAST:  121m OMNIPAQUE IOHEXOL 300 MG/ML  SOLN COMPARISON:  Abdomen/pelvis CT 07/03/2020 FINDINGS: CT CHEST FINDINGS Cardiovascular: The heart size is normal. No substantial pericardial effusion. No thoracic aortic aneurysm. Mediastinum/Nodes: No mediastinal lymphadenopathy. There is no hilar lymphadenopathy. The esophagus has normal imaging features. There is no axillary lymphadenopathy. Lungs/Pleura: No suspicious pulmonary nodule or mass. No focal airspace consolidation. Subsegmental atelectasis noted posterior left base. No pleural effusion. Musculoskeletal: No worrisome lytic or sclerotic osseous abnormality. CT ABDOMEN PELVIS FINDINGS Hepatobiliary: No suspicious focal abnormality within the liver parenchyma. There is no evidence for gallstones, gallbladder wall thickening, or pericholecystic fluid. No intrahepatic or extrahepatic biliary dilation. Pancreas: No focal mass lesion. No dilatation of the main duct. No intraparenchymal cyst. No peripancreatic edema. Spleen: No splenomegaly. No focal mass lesion. Adrenals/Urinary Tract: No adrenal nodule or mass. Kidneys unremarkable. No evidence for hydroureter. The urinary bladder appears normal for the degree of distention. Stomach/Bowel: Stomach is  unremarkable. No gastric wall thickening. No evidence of outlet obstruction. Duodenum is normally positioned as is the ligament of Treitz. No small bowel wall thickening. No small bowel dilatation. The terminal ileum is normal. The appendix is normal. Left hemicolectomy. Vascular/Lymphatic: No abdominal aortic aneurysm. There is no gastrohepatic or hepatoduodenal ligament lymphadenopathy. No retroperitoneal or mesenteric lymphadenopathy. No pelvic sidewall lymphadenopathy. Reproductive: The uterus is unremarkable.  There is no adnexal mass. Other: No intraperitoneal free fluid. Scarring noted right lower quadrant. Left percutaneous peritoneal drain seen previously has been removed in the interval. Scarring in the anterior pelvis noted with near complete resolution of extraperitoneal edema in the lower abdomen/pelvis. Musculoskeletal: No worrisome lytic or sclerotic osseous abnormality. IMPRESSION: 1. No evidence for metastatic disease in the chest, abdomen, or pelvis. 2. Interval removal of the left percutaneous peritoneal drain with near complete resolution of extraperitoneal edema in the lower abdomen/pelvis. 3. Left hemicolectomy. 4. Aortic Atherosclerosis (ICD10-I70.0). Electronically Signed   By: EMisty StanleyM.D.   On: 08/28/2020 08:26   DG CHEST PORT 1 VIEW  Result Date: 08/29/2020 CLINICAL DATA:  Post Port-A-Cath insertion EXAM: PORTABLE CHEST 1 VIEW COMPARISON:  Portable exam 0856 hours compared to 03/28/2020 FINDINGS: RIGHT jugular Port-A-Cath with tip projecting over cavoatrial junction. Enlargement of cardiac silhouette with vascular congestion. Mediastinal contours normal for slight degree of rotation to the LEFT. Decreased lung volumes with mild bibasilar atelectasis. Cannot exclude minimal perihilar infiltrate. No pleural effusion or pneumothorax. Bones unremarkable. IMPRESSION: No pneumothorax following RIGHT jugular Port-A-Cath insertion. Mild bibasilar atelectasis and question minimal  perihilar infiltrate. Electronically Signed   By: MLavonia DanaM.D.   On: 08/29/2020 09:10   DG C-Arm 1-60 Min-No Report  Result Date: 08/29/2020 Fluoroscopy was utilized by the requesting physician.  No radiographic interpretation.    Assessment and plan-  1. Malignant [place]moid colon ([place]   2. Encounter for antineoplastic chemotherapy   3. Anemia due to antineoplastic chemotherapy   4. Obesity (BMI 35.0-39.9 without comorbidity)   5. Skin candidiasis   Cancer Staging Malignant ne[place] of sigmoid colon ([place] Staging form: Colon and Rectum, AJCC 8th Edition - Clinical: No stage assigned - Unsigned - Pathologic: Stage IIIB (pT3, pN1c, cM0) - Signed by YEarlie Server MD on 02/15/2020  #  Stage IIIb colon cancer- 01/30/2020 pT3 pN1c, MMR intact  On adjuvant FOLFOX, bolus 5-FU omitted starting cycle 2.   SHe tolerates well Labs are reviewed and discussed with patient. Okay to proceed with FOLFOX with no 5-FU bolus today.   #Anemia,  Chemotherapy-induced.  Stable. #Mild thrombocytopenia, counts are stable. #Morbid obesity, recommend exercise as tolerated.  Healthy diet and lifestyle modification #Left axillary rash, likely yeast infection.  Recommend nystatin powder topical 3 times daily as needed. Keep area dry  Spanish interpretor was present for the entire encounter.  Supportive care measures are necessary for patient well-being and will be provided as necessary. We spent sufficient time to discuss many aspect of care, questions were answered to patient's satisfaction.   Follow-up in 2 week for evaluation prior to next cycle treatments Earlie Server, MD, PhD Hematology Oncology [facility] at [facility] Pager- [phone] 10/15/2020

## 2020-10-15 NOTE — Progress Notes (Signed)
Pt received folfox infusion in clinic today. Tolerated well. Chemo pump connected at d/c.

## 2020-10-15 NOTE — Progress Notes (Signed)
When patient's fingers get cold there is a change in color and feeling numb.  After the last treatment she also had numbness in her feet.  The Hydrocortisone cream is note helping the rash on her left side.

## 2020-10-17 ENCOUNTER — Inpatient Hospital Stay: Payer: Self-pay

## 2020-10-17 DIAGNOSIS — C187 Malignant neoplasm of sigmoid colon: Secondary | ICD-10-CM

## 2020-10-17 MED ORDER — HEPARIN SOD (PORK) LOCK FLUSH 100 UNIT/ML IV SOLN
500.0000 [IU] | Freq: Once | INTRAVENOUS | Status: AC | PRN
Start: 2020-10-17 — End: 2020-10-17
  Administered 2020-10-17: 500 [IU]
  Filled 2020-10-17: qty 5

## 2020-10-17 MED ORDER — SODIUM CHLORIDE 0.9% FLUSH
10.0000 mL | INTRAVENOUS | Status: DC | PRN
  Administered 2020-10-17: 10 mL
  Filled 2020-10-17: qty 10

## 2020-10-17 MED ORDER — HEPARIN SOD (PORK) LOCK FLUSH 100 UNIT/ML IV SOLN
INTRAVENOUS | Status: AC
  Filled 2020-10-17: qty 5

## 2020-10-29 ENCOUNTER — Inpatient Hospital Stay (HOSPITAL_BASED_OUTPATIENT_CLINIC_OR_DEPARTMENT_OTHER): Payer: Self-pay | Admitting: Oncology

## 2020-10-29 ENCOUNTER — Inpatient Hospital Stay: Payer: Self-pay

## 2020-10-29 ENCOUNTER — Encounter: Payer: Self-pay | Admitting: Oncology

## 2020-10-29 VITALS — BP 131/76 | HR 72 | Temp 99.3°F | Resp 18 | Wt 195.7 lb

## 2020-10-29 DIAGNOSIS — C187 Malignant neoplasm of sigmoid colon: Secondary | ICD-10-CM

## 2020-10-29 DIAGNOSIS — Z5111 Encounter for antineoplastic chemotherapy: Secondary | ICD-10-CM

## 2020-10-29 DIAGNOSIS — D6959 Other secondary thrombocytopenia: Secondary | ICD-10-CM

## 2020-10-29 DIAGNOSIS — T451X5A Adverse effect of antineoplastic and immunosuppressive drugs, initial encounter: Secondary | ICD-10-CM

## 2020-10-29 DIAGNOSIS — D6481 Anemia due to antineoplastic chemotherapy: Secondary | ICD-10-CM

## 2020-10-29 DIAGNOSIS — E669 Obesity, unspecified: Secondary | ICD-10-CM

## 2020-10-29 DIAGNOSIS — K5900 Constipation, unspecified: Secondary | ICD-10-CM

## 2020-10-29 LAB — COMPREHENSIVE METABOLIC PANEL
ALT: 23 U/L (ref 0–44)
AST: 33 U/L (ref 15–41)
Albumin: 3.9 g/dL (ref 3.5–5.0)
Alkaline Phosphatase: 123 U/L (ref 38–126)
Anion gap: 10 (ref 5–15)
BUN: 10 mg/dL (ref 6–20)
CO2: 24 mmol/L (ref 22–32)
Calcium: 9.1 mg/dL (ref 8.9–10.3)
Chloride: 104 mmol/L (ref 98–111)
Creatinine, Ser: 0.52 mg/dL (ref 0.44–1.00)
GFR, Estimated: >60 mL/min (ref >60)
Glucose, Bld: 138 mg/dL — ABNORMAL HIGH (ref 70–99)
Potassium: 3.8 mmol/L (ref 3.5–5.1)
Sodium: 138 mmol/L (ref 135–145)
Total Bilirubin: 0.7 mg/dL (ref 0.3–1.2)
Total Protein: 7.3 g/dL (ref 6.5–8.1)

## 2020-10-29 LAB — CBC WITH DIFFERENTIAL/PLATELET
Abs Immature Granulocytes: 0.01 10*3/uL (ref 0.00–0.07)
Basophils Absolute: 0 10*3/uL (ref 0.0–0.1)
Basophils Relative: 1 %
Eosinophils Absolute: 0.1 10*3/uL (ref 0.0–0.5)
Eosinophils Relative: 2 %
HCT: 37 % (ref 36.0–46.0)
Hemoglobin: 12.2 g/dL (ref 12.0–15.0)
Immature Granulocytes: 0 %
Lymphocytes Relative: 35 %
Lymphs Abs: 1.4 10*3/uL (ref 0.7–4.0)
MCH: 28.7 pg (ref 26.0–34.0)
MCHC: 33 g/dL (ref 30.0–36.0)
MCV: 87.1 fL (ref 80.0–100.0)
Monocytes Absolute: 0.5 10*3/uL (ref 0.1–1.0)
Monocytes Relative: 12 %
Neutro Abs: 2 10*3/uL (ref 1.7–7.7)
Neutrophils Relative %: 50 %
Platelets: 120 10*3/uL — ABNORMAL LOW (ref 150–400)
RBC: 4.25 MIL/uL (ref 3.87–5.11)
RDW: 15.1 % (ref 11.5–15.5)
WBC: 4 10*3/uL (ref 4.0–10.5)
nRBC: 0 % (ref 0.0–0.2)

## 2020-10-29 MED ORDER — DEXTROSE 5 % IV SOLN
750.0000 mg | Freq: Once | INTRAVENOUS | Status: AC
  Administered 2020-10-29: 750 mg via INTRAVENOUS
  Filled 2020-10-29: qty 37.5

## 2020-10-29 MED ORDER — DEXTROSE 5 % IV SOLN
Freq: Once | INTRAVENOUS | Status: AC
  Filled 2020-10-29: qty 250

## 2020-10-29 MED ORDER — SODIUM CHLORIDE 0.9 % IV SOLN
10.0000 mg | Freq: Once | INTRAVENOUS | Status: AC
  Administered 2020-10-29: 10 mg via INTRAVENOUS
  Filled 2020-10-29: qty 10

## 2020-10-29 MED ORDER — SODIUM CHLORIDE 0.9 % IV SOLN
2400.0000 mg/m2 | INTRAVENOUS | Status: DC
  Administered 2020-10-29: 4400 mg via INTRAVENOUS
  Filled 2020-10-29: qty 88

## 2020-10-29 MED ORDER — PALONOSETRON HCL INJECTION 0.25 MG/5ML
0.2500 mg | Freq: Once | INTRAVENOUS | Status: AC
  Administered 2020-10-29: 0.25 mg via INTRAVENOUS
  Filled 2020-10-29: qty 5

## 2020-10-29 MED ORDER — SODIUM CHLORIDE 0.9% FLUSH
10.0000 mL | Freq: Once | INTRAVENOUS | Status: AC
  Administered 2020-10-29: 10 mL via INTRAVENOUS
  Filled 2020-10-29: qty 10

## 2020-10-29 MED ORDER — POLYETHYLENE GLYCOL 3350 17 G PO PACK: 17.0000 g | PACK | Freq: Every day | ORAL | 0 refills | Status: DC | PRN

## 2020-10-29 MED ORDER — HEPARIN SOD (PORK) LOCK FLUSH 100 UNIT/ML IV SOLN
500.0000 [IU] | Freq: Once | INTRAVENOUS | Status: DC
  Filled 2020-10-29: qty 5

## 2020-10-29 MED ORDER — OXALIPLATIN CHEMO INJECTION 100 MG/20ML
150.0000 mg | Freq: Once | INTRAVENOUS | Status: AC
  Administered 2020-10-29: 150 mg via INTRAVENOUS
  Filled 2020-10-29: qty 20

## 2020-10-29 NOTE — Progress Notes (Signed)
Hematology/Oncology Progress Note [facility] [phone] [phone]  Patient Care Team: [name], MD as PCP - General (Family Medicine) [name], RN as Registered Nurse [name], RN as Registered Nurse   Name of the patient: Debra [name]  [id number]  1966/07/10  Date of visit: 10/29/20  PERTINENT ONCOLOGY HISTORY Debra [name] is a 55 y.o.afemale who has above oncology history reviewed by me today presented for follow up visit for management of Stage IIIB Colon cancer seen by me on 11/21/2019 during her hospitalization. Patient has a history of Covid positivity. Severe iron deficiency status post IV Venofer treatment. Colonoscopy 11/21/2019 showed a malignant appearing severe stenosis found in the sigmoid colon at 25 cm proximal to the anus and was nontransversed.  Biopsy was taken.  Surgical pathology is pending  Nonbleeding external hemorrhoids Biopsy was not diagnostic 01/03/2020 virtual colonoscopy showed long segment area of wall thickening and annular constriction within the sigmoid colon in the area of previous concern most compatible with annular malignancy.  Remainder of the colon evaluation is somewhat limited due to large amount of retained stool and incomplete distention proximal to the annular constricting lesion. 01/30/2020 Patient underwent left hemicolectomy by Dr. Celine Ahr and a biopsy showed invasive adenocarcinoma, poorly differentiated.  37 lymph nodes negative for malignancy.  Grade 3, all margins are uninvolved.  Perineural invasion present.  Tumor deposit present. pT3 pN1c cM0 Checks x-ray did not show any lung mass. #Postop, patient developed postop ileus and she required TPN, PICC and NG tube placement.  Postoperation day 9 she continued to have increased leukocytosis and was subsequently found to have a midline wound infection to the inferior portion of the wound.  Patient was treated with  antibiotics.  07/03/2020 CT abdomen pelvis w contrast showed well positioned drainage catheter with apparent resolution of the intra-abdominal abscess. 07/10/2020 seen by [facility] and patient had drainage catheter removed by IR on 07/12/2020   INTERVAL HISTORY Melodi Buffey Zabinski is a 55 y.o. female who has above history reviewed by me today presents for follow up visit for management of stage IIIb colon cancer Problems and complaints are listed below: Patient is on adjuvant FOLFOX.   Overall she tolerates well.  She reports constipation.  She had mild rectal bleeding after a very hard bowel movement.   Review of systems- Review of Systems  Constitutional: Negative for appetite change, chills, fatigue, fever and unexpected weight change.  HENT:   Negative for hearing loss and voice change.   Eyes: Negative for eye problems.  Respiratory: Negative for chest tightness, cough and shortness of breath.   Cardiovascular: Negative for chest pain.  Gastrointestinal: Positive for constipation. Negative for abdominal distention, abdominal pain and blood in stool.  Endocrine: Negative for hot flashes.  Genitourinary: Negative for difficulty urinating, dysuria and frequency.   Musculoskeletal: Negative for arthralgias.  Skin: Negative for itching and wound.  Neurological: Negative for extremity weakness, headaches and light-headedness.  Hematological: Negative for adenopathy.  Psychiatric/Behavioral: Negative for confusion.    No Known Allergies  Patient Active Problem List   Diagnosis Date Noted  . Encounter for antineoplastic chemotherapy 09/03/2020  . Encounter for central line placement   . Loss of weight 02/15/2020  . Goals of care, counseling/discussion 02/15/2020  . Malignant neoplasm of sigmoid colon ([place]) 02/15/2020  . Pressure injury of skin 02/03/2020  . Bowel obstruction ([place]) 01/29/2020  . Iron deficiency anemia due to chronic blood loss 11/22/2019  . COVID-19 virus  detected  11/20/2019  . Symptomatic anemia 11/20/2019  . Hypothyroidism 11/20/2019  . Iron deficiency anemia 11/20/2019  . GIB (gastrointestinal bleeding) 11/20/2019     Past Medical History:  Diagnosis Date  . Anemia   . Colon cancer ([place]) 04/2020  . Hypothyroidism   . Thyroid disease      Past Surgical History:  Procedure Laterality Date  . COLONOSCOPY WITH PROPOFOL N/A 11/21/2019   Procedure: COLONOSCOPY WITH PROPOFOL;  Surgeon: Lin Landsman, MD;  Location: [facility] ENDOSCOPY;  Service: Gastroenterology;  Laterality: N/A;  . CYSTOSCOPY WITH STENT PLACEMENT Left 01/30/2020   Procedure: CYSTOSCOPY WITH STENT PLACEMENT;  Surgeon: Hollice Espy, MD;  Location: [facility] ORS;  Service: Urology;  Laterality: Left;  . ESOPHAGOGASTRODUODENOSCOPY (EGD) WITH PROPOFOL N/A 11/21/2019   Procedure: ESOPHAGOGASTRODUODENOSCOPY (EGD) WITH PROPOFOL;  Surgeon: Lin Landsman, MD;  Location: [facility] ENDOSCOPY;  Service: Gastroenterology;  Laterality: N/A;  . IR RADIOLOGIST EVAL & MGMT  07/03/2020  . IR RADIOLOGIST EVAL & MGMT  07/12/2020  . PARTIAL COLECTOMY N/A 01/30/2020   Procedure: PARTIAL COLECTOMY Sigmoid;  Surgeon: [name], MD;  Location: [facility] ORS;  Service: General;  Laterality: N/A;  . PORTACATH PLACEMENT Left 03/28/2020   Procedure: INSERTION PORT-A-CATH;  Surgeon: [name], MD;  Location: [facility] ORS;  Service: General;  Laterality: Left;  . PORTACATH PLACEMENT N/A 08/29/2020   Procedure: INSERTION PORT-A-CATH, chemotherapy;  Surgeon: [name], MD;  Location: [facility] ORS;  Service: General;  Laterality: N/A;    Social History   Socioeconomic History  . Marital status: Single    Spouse name: Not on file  . Number of children: Not on file  . Years of education: Not on file  . Highest education level: Not on file  Occupational History  . Occupation: Factory  Tobacco Use  . Smoking status: Never Smoker  . Smokeless tobacco: Never Used  Vaping Use  . Vaping Use: Never used   Substance and Sexual Activity  . Alcohol use: Never  . Drug use: Never  . Sexual activity: Not on file  Other Topics Concern  . Not on file  Social History Narrative   Lives at home with significant other   Social Determinants of Health   Financial Resource Strain: Not on file  Food Insecurity: Not on file  Transportation Needs: Not on file  Physical Activity: Not on file  Stress: Not on file  Social Connections: Not on file  Intimate Partner Violence: Not on file     Family History  Problem Relation Age of Onset  . Diabetes Sister      Current Outpatient Medications:  .  B-D 3CC LUER-LOK SYR 25GX1" 25G X 1" 3 ML MISC, Use with B12 injection, Disp: , Rfl:  .  cyanocobalamin (,VITAMIN B-12,) 1000 MCG/ML injection, Inject 1,000 mcg into the muscle every 30 (thirty) days., Disp: , Rfl:  .  docusate sodium (COLACE) 100 MG capsule, Take 1 capsule (100 mg total) by mouth daily., Disp: 30 capsule, Rfl: 1 .  ferrous sulfate 325 (65 FE) MG tablet, Take 1 tablet (325 mg total) by mouth 2 (two) times daily with a meal. (Patient not taking: No sig reported), Disp: 60 tablet, Rfl: 3 .  hydrocortisone cream 0.5 %, Apply 1 application topically 2 (two) times daily. Until rash is healed., Disp: 30 g, Rfl: 0 .  ibuprofen (ADVIL) 800 MG tablet, Take 1 tablet (800 mg total) by mouth every 8 (eight) hours as needed., Disp: 30 tablet, Rfl: 0 .  levothyroxine (  SYNTHROID) 137 MCG tablet, Take 137 mcg by mouth daily before breakfast. , Disp: , Rfl:  .  lidocaine-prilocaine (EMLA) cream, Apply to affected area once, Disp: 30 g, Rfl: 3 .  magic mouthwash w/lidocaine SOLN, Take 5 mLs by mouth 4 (four) times daily as needed for mouth pain. Sig: Swish/Spit 5-10 ml four times a day as needed. Dispense 480 ml. 1RF, Disp: 480 mL, Rfl: 1 .  nystatin (MYCOSTATIN/NYSTOP) powder, Apply 1 application topically 3 (three) times daily., Disp: 30 g, Rfl: 0 .  ondansetron (ZOFRAN) 8 MG tablet, Take 1 tablet (8 mg  total) by mouth 2 (two) times daily as needed for refractory nausea / vomiting. Start on day 3 after chemotherapy. (Patient not taking: No sig reported), Disp: 30 tablet, Rfl: 1 .  prochlorperazine (COMPAZINE) 10 MG tablet, Take 1 tablet (10 mg total) by mouth every 6 (six) hours as needed (Nausea or vomiting). (Patient not taking: No sig reported), Disp: 30 tablet, Rfl: 1   Physical exam:  There were no vitals filed for this visit. Physical Exam Constitutional:      General: She is not in acute distress.    Appearance: She is obese.     Comments: Patient walks independently  HENT:     Head: Normocephalic and atraumatic.     Mouth/Throat:     Pharynx: No oropharyngeal exudate.  Eyes:     General: No scleral icterus.    Pupils: Pupils are equal, round, and reactive to light.  Cardiovascular:     Rate and Rhythm: Normal rate and regular rhythm.     Heart sounds: No murmur heard.   Pulmonary:     Effort: Pulmonary effort is normal. No respiratory distress.  Abdominal:     General: There is no distension.     Palpations: Abdomen is soft.     Tenderness: There is no abdominal tenderness.  Musculoskeletal:        General: Normal range of motion.     Cervical back: Normal range of motion and neck supple.  Skin:    General: Skin is warm and dry.     Findings: No erythema.     Comments: Right side anterior chest wall + Mediport  Neurological:     Mental Status: She is alert and oriented to person, place, and time.  Psychiatric:        Mood and Affect: Affect normal.   left axillary rash     CMP Latest Ref Rng & Units 10/15/2020  Glucose 70 - 99 mg/dL 148(H)  BUN 6 - 20 mg/dL 15  Creatinine 0.44 - 1.00 mg/dL 0.44  Sodium 135 - 145 mmol/L 136  Potassium 3.5 - 5.1 mmol/L 3.9  Chloride 98 - 111 mmol/L 103  CO2 22 - 32 mmol/L 26  Calcium 8.9 - 10.3 mg/dL 9.4  Total Protein 6.5 - 8.1 g/dL 7.2  Total Bilirubin 0.3 - 1.2 mg/dL 0.7  Alkaline Phos 38 - 126 U/L 102  AST 15 - 41 U/L  33  ALT 0 - 44 U/L 21   CBC Latest Ref Rng & Units 10/15/2020  WBC 4.0 - 10.5 K/uL 3.9(L)  Hemoglobin 12.0 - 15.0 g/dL 12.0  Hematocrit 36.0 - 46.0 % 34.9(L)  Platelets 150 - 400 K/uL 105(L)    RADIOGRAPHIC STUDIES: I have personally reviewed the radiological images as listed and agreed with the findings in the report. CT CHEST ABDOMEN PELVIS W CONTRAST  Result Date: 08/28/2020 CLINICAL DATA:  Colon cancer.  Restaging. EXAM:  CT CHEST, ABDOMEN, AND PELVIS WITH CONTRAST TECHNIQUE: Multidetector CT imaging of the chest, abdomen and pelvis was performed following the standard protocol during bolus administration of intravenous contrast. CONTRAST:  139m OMNIPAQUE IOHEXOL 300 MG/ML  SOLN COMPARISON:  Abdomen/pelvis CT 07/03/2020 FINDINGS: CT CHEST FINDINGS Cardiovascular: The heart size is normal. No substantial pericardial effusion. No thoracic aortic aneurysm. Mediastinum/Nodes: No mediastinal lymphadenopathy. There is no hilar lymphadenopathy. The esophagus has normal imaging features. There is no axillary lymphadenopathy. Lungs/Pleura: No suspicious pulmonary nodule or mass. No focal airspace consolidation. Subsegmental atelectasis noted posterior left base. No pleural effusion. Musculoskeletal: No worrisome lytic or sclerotic osseous abnormality. CT ABDOMEN PELVIS FINDINGS Hepatobiliary: No suspicious focal abnormality within the liver parenchyma. There is no evidence for gallstones, gallbladder wall thickening, or pericholecystic fluid. No intrahepatic or extrahepatic biliary dilation. Pancreas: No focal mass lesion. No dilatation of the main duct. No intraparenchymal cyst. No peripancreatic edema. Spleen: No splenomegaly. No focal mass lesion. Adrenals/Urinary Tract: No adrenal nodule or mass. Kidneys unremarkable. No evidence for hydroureter. The urinary bladder appears normal for the degree of distention. Stomach/Bowel: Stomach is unremarkable. No gastric wall thickening. No evidence of outlet  obstruction. Duodenum is normally positioned as is the ligament of Treitz. No small bowel wall thickening. No small bowel dilatation. The terminal ileum is normal. The appendix is normal. Left hemicolectomy. Vascular/Lymphatic: No abdominal aortic aneurysm. There is no gastrohepatic or hepatoduodenal ligament lymphadenopathy. No retroperitoneal or mesenteric lymphadenopathy. No pelvic sidewall lymphadenopathy. Reproductive: The uterus is unremarkable.  There is no adnexal mass. Other: No intraperitoneal free fluid. Scarring noted right lower quadrant. Left percutaneous peritoneal drain seen previously has been removed in the interval. Scarring in the anterior pelvis noted with near complete resolution of extraperitoneal edema in the lower abdomen/pelvis. Musculoskeletal: No worrisome lytic or sclerotic osseous abnormality. IMPRESSION: 1. No evidence for metastatic disease in the chest, abdomen, or pelvis. 2. Interval removal of the left percutaneous peritoneal drain with near complete resolution of extraperitoneal edema in the lower abdomen/pelvis. 3. Left hemicolectomy. 4. Aortic Atherosclerosis (ICD10-I70.0). Electronically Signed   By: EMisty StanleyM.D.   On: 08/28/2020 08:26   DG CHEST PORT 1 VIEW  Result Date: 08/29/2020 CLINICAL DATA:  Post Port-A-Cath insertion EXAM: PORTABLE CHEST 1 VIEW COMPARISON:  Portable exam 0856 hours compared to 03/28/2020 FINDINGS: RIGHT jugular Port-A-Cath with tip projecting over cavoatrial junction. Enlargement of cardiac silhouette with vascular congestion. Mediastinal contours normal for slight degree of rotation to the LEFT. Decreased lung volumes with mild bibasilar atelectasis. Cannot exclude minimal perihilar infiltrate. No pleural effusion or pneumothorax. Bones unremarkable. IMPRESSION: No pneumothorax following RIGHT jugular Port-A-Cath insertion. Mild bibasilar atelectasis and question minimal perihilar infiltrate. Electronically Signed   By: MLavonia DanaM.D.    On: 08/29/2020 09:10   DG C-Arm 1-60 Min-No Report  Result Date: 08/29/2020 Fluoroscopy was utilized by the requesting physician.  No radiographic interpretation.    Assessment and plan-  1. Malignant ne[place] of sigmoid colon ([place]   2. Encounter for antineoplastic chemotherapy   3. Anemia due to antineoplastic chemotherapy   4. Constipation, unspecified constipation type   5. Obesity (BMI 35.0-39.9 without comorbidity)   6. Chemotherapy-induced thrombocytopenia   Cancer Staging Malignant neoplasm of sigmoid colon ([place] Staging form: Colon and Rectum, AJCC 8th Edition - Clinical: No stage assigned - Unsigned - Pathologic: Stage IIIB (pT3, pN1c, cM0) - Signed by YEarlie Server MD on 02/15/2020  #Stage IIIb colon cancer- 01/30/2020 pT3 pN1c, MMR intact  On adjuvant FOLFOX, bolus  5-FU omitted starting cycle 2.   Labs reviewed and discussed with patient. Counts are acceptable to proceed with FOLFOX treatment today.  #Constipation, I recommend patient to increase Colace 200 mg twice daily.  Add MiraLAX daily as needed. #Anemia,  Chemotherapy-induced.  Stable. #Mild thrombocytopenia, counts are stable. #Morbid obesity, recommend exercise as tolerated.  Healthy diet and lifestyle modification #Left axillary rash, likely yeast infection.  Recommend nystatin powder topical 3 times daily as needed. Keep area dry  Spanish interpretor was present for the entire encounter.  Supportive care measures are necessary for patient well-being and will be provided as necessary. We spent sufficient time to discuss many aspect of care, questions were answered to patient's satisfaction.   Follow-up in 2 week for evaluation prior to next cycle treatments Earlie Server, MD, PhD Hematology Oncology [facility] at [facility] Pager- [phone] 10/29/2020

## 2020-10-29 NOTE — Progress Notes (Signed)
Pt tolerated all infusions well today.  5FU pump connected per policy.  Pt left chemo suite stable and ambulatory with her pump infusing as ordered

## 2020-10-29 NOTE — Progress Notes (Signed)
Patient here for follow up. Pt reports that she had some constipation that caused pain and some bleeding, but has resolved.

## 2020-10-31 ENCOUNTER — Inpatient Hospital Stay: Payer: Self-pay

## 2020-10-31 ENCOUNTER — Other Ambulatory Visit: Payer: Self-pay

## 2020-10-31 DIAGNOSIS — C187 Malignant neoplasm of sigmoid colon: Secondary | ICD-10-CM

## 2020-10-31 MED ORDER — SODIUM CHLORIDE 0.9% FLUSH
10.0000 mL | INTRAVENOUS | Status: DC | PRN
  Administered 2020-10-31: 10 mL
  Filled 2020-10-31: qty 10

## 2020-10-31 MED ORDER — HEPARIN SOD (PORK) LOCK FLUSH 100 UNIT/ML IV SOLN
INTRAVENOUS | Status: AC
  Filled 2020-10-31: qty 5

## 2020-10-31 MED ORDER — HEPARIN SOD (PORK) LOCK FLUSH 100 UNIT/ML IV SOLN
500.0000 [IU] | Freq: Once | INTRAVENOUS | Status: AC | PRN
  Administered 2020-10-31: 500 [IU]
  Filled 2020-10-31: qty 5

## 2020-11-12 ENCOUNTER — Inpatient Hospital Stay: Payer: Self-pay

## 2020-11-12 ENCOUNTER — Other Ambulatory Visit: Payer: Self-pay

## 2020-11-12 ENCOUNTER — Inpatient Hospital Stay (HOSPITAL_BASED_OUTPATIENT_CLINIC_OR_DEPARTMENT_OTHER): Payer: Self-pay | Admitting: Oncology

## 2020-11-12 ENCOUNTER — Encounter: Payer: Self-pay | Admitting: Oncology

## 2020-11-12 ENCOUNTER — Inpatient Hospital Stay: Payer: Self-pay | Attending: Oncology

## 2020-11-12 VITALS — BP 130/83 | HR 63 | Temp 97.9°F | Resp 18 | Wt 196.9 lb

## 2020-11-12 DIAGNOSIS — C187 Malignant neoplasm of sigmoid colon: Secondary | ICD-10-CM | POA: Insufficient documentation

## 2020-11-12 DIAGNOSIS — Z5111 Encounter for antineoplastic chemotherapy: Secondary | ICD-10-CM

## 2020-11-12 DIAGNOSIS — D6481 Anemia due to antineoplastic chemotherapy: Secondary | ICD-10-CM | POA: Insufficient documentation

## 2020-11-12 DIAGNOSIS — T451X5A Adverse effect of antineoplastic and immunosuppressive drugs, initial encounter: Secondary | ICD-10-CM

## 2020-11-12 DIAGNOSIS — D6959 Other secondary thrombocytopenia: Secondary | ICD-10-CM | POA: Insufficient documentation

## 2020-11-12 DIAGNOSIS — Z79899 Other long term (current) drug therapy: Secondary | ICD-10-CM | POA: Insufficient documentation

## 2020-11-12 LAB — CBC WITH DIFFERENTIAL/PLATELET
Abs Immature Granulocytes: 0.01 10*3/uL (ref 0.00–0.07)
Basophils Absolute: 0 10*3/uL (ref 0.0–0.1)
Basophils Relative: 0 %
Eosinophils Absolute: 0.1 10*3/uL (ref 0.0–0.5)
Eosinophils Relative: 2 %
HCT: 35.2 % — ABNORMAL LOW (ref 36.0–46.0)
Hemoglobin: 11.8 g/dL — ABNORMAL LOW (ref 12.0–15.0)
Immature Granulocytes: 0 %
Lymphocytes Relative: 36 %
Lymphs Abs: 1.3 10*3/uL (ref 0.7–4.0)
MCH: 29.5 pg (ref 26.0–34.0)
MCHC: 33.5 g/dL (ref 30.0–36.0)
MCV: 88 fL (ref 80.0–100.0)
Monocytes Absolute: 0.5 10*3/uL (ref 0.1–1.0)
Monocytes Relative: 14 %
Neutro Abs: 1.7 10*3/uL (ref 1.7–7.7)
Neutrophils Relative %: 48 %
Platelets: 97 10*3/uL — ABNORMAL LOW (ref 150–400)
RBC: 4 MIL/uL (ref 3.87–5.11)
RDW: 15.6 % — ABNORMAL HIGH (ref 11.5–15.5)
WBC: 3.5 10*3/uL — ABNORMAL LOW (ref 4.0–10.5)
nRBC: 0 % (ref 0.0–0.2)

## 2020-11-12 LAB — COMPREHENSIVE METABOLIC PANEL
ALT: 20 U/L (ref 0–44)
AST: 32 U/L (ref 15–41)
Albumin: 3.8 g/dL (ref 3.5–5.0)
Alkaline Phosphatase: 105 U/L (ref 38–126)
Anion gap: 9 (ref 5–15)
BUN: 9 mg/dL (ref 6–20)
CO2: 25 mmol/L (ref 22–32)
Calcium: 8.9 mg/dL (ref 8.9–10.3)
Chloride: 105 mmol/L (ref 98–111)
Creatinine, Ser: 0.6 mg/dL (ref 0.44–1.00)
GFR, Estimated: >60 mL/min (ref >60)
Glucose, Bld: 140 mg/dL — ABNORMAL HIGH (ref 70–99)
Potassium: 3.5 mmol/L (ref 3.5–5.1)
Sodium: 139 mmol/L (ref 135–145)
Total Bilirubin: 0.6 mg/dL (ref 0.3–1.2)
Total Protein: 7.1 g/dL (ref 6.5–8.1)

## 2020-11-12 MED ORDER — OXALIPLATIN CHEMO INJECTION 100 MG/20ML
82.0000 mg/m2 | Freq: Once | INTRAVENOUS | Status: AC
  Administered 2020-11-12: 150 mg via INTRAVENOUS
  Filled 2020-11-12: qty 10

## 2020-11-12 MED ORDER — SODIUM CHLORIDE 0.9% FLUSH
10.0000 mL | Freq: Once | INTRAVENOUS | Status: AC
  Administered 2020-11-12: 10 mL via INTRAVENOUS
  Filled 2020-11-12: qty 10

## 2020-11-12 MED ORDER — SODIUM CHLORIDE 0.9 % IV SOLN
2400.0000 mg/m2 | INTRAVENOUS | Status: DC
  Administered 2020-11-12: 4400 mg via INTRAVENOUS
  Filled 2020-11-12: qty 88

## 2020-11-12 MED ORDER — DEXTROSE 5 % IV SOLN
Freq: Once | INTRAVENOUS | Status: AC
Start: 2020-11-12 — End: 2020-11-12
  Filled 2020-11-12: qty 250

## 2020-11-12 MED ORDER — LEUCOVORIN CALCIUM INJECTION 350 MG
410.0000 mg/m2 | Freq: Once | INTRAVENOUS | Status: AC
  Administered 2020-11-12: 750 mg via INTRAVENOUS
  Filled 2020-11-12: qty 25

## 2020-11-12 MED ORDER — SODIUM CHLORIDE 0.9 % IV SOLN
10.0000 mg | Freq: Once | INTRAVENOUS | Status: AC
  Administered 2020-11-12: 10 mg via INTRAVENOUS
  Filled 2020-11-12: qty 10

## 2020-11-12 MED ORDER — PALONOSETRON HCL INJECTION 0.25 MG/5ML
0.2500 mg | Freq: Once | INTRAVENOUS | Status: AC
  Administered 2020-11-12: 0.25 mg via INTRAVENOUS
  Filled 2020-11-12: qty 5

## 2020-11-12 NOTE — Progress Notes (Signed)
Patient here for follow up. No new concerns voiced.  °

## 2020-11-12 NOTE — Progress Notes (Signed)
Hematology/Oncology Progress Note [facility] Telephone:([phone] [phone]  Patient Care Team: [name], MD as PCP - General (Family Medicine) [name], RN as Registered Nurse [name], RN as Registered Nurse   Name of the patient: Debra [name]  [id number]  18-Jul-1966  Date of visit: 11/12/20  PERTINENT ONCOLOGY HISTORY Debra [name] is a 55 y.o.afemale who has above oncology history reviewed by me today presented for follow up visit for management of Stage IIIB Colon cancer seen by me on 11/21/2019 during her hospitalization. Patient has a history of Covid positivity. Severe iron deficiency status post IV Venofer treatment. Colonoscopy 11/21/2019 showed a malignant appearing severe stenosis found in the sigmoid colon at 25 cm proximal to the anus and was nontransversed.  Biopsy was taken.  Surgical pathology is pending  Nonbleeding external hemorrhoids Biopsy was not diagnostic 01/03/2020 virtual colonoscopy showed long segment area of wall thickening and annular constriction within the sigmoid colon in the area of previous concern most compatible with annular malignancy.  Remainder of the colon evaluation is somewhat limited due to large amount of retained stool and incomplete distention proximal to the annular constricting lesion. 01/30/2020 Patient underwent left hemicolectomy by Dr. Celine Ahr and a biopsy showed invasive adenocarcinoma, poorly differentiated.  37 lymph nodes negative for malignancy.  Grade 3, all margins are uninvolved.  Perineural invasion present.  Tumor deposit present. pT3 pN1c cM0 Checks x-ray did not show any lung mass. #Postop, patient developed postop ileus and she required TPN, PICC and NG tube placement.  Postoperation day 9 she continued to have increased leukocytosis and was subsequently found to have a midline wound infection to the inferior portion of the wound.  Patient was treated with  antibiotics.  07/03/2020 CT abdomen pelvis w contrast showed well positioned drainage catheter with apparent resolution of the intra-abdominal abscess. 07/10/2020 seen by [facility] and patient had drainage catheter removed by IR on 07/12/2020   INTERVAL HISTORY Debra [name] is a 55 y.o. female who has above history reviewed by me today presents for follow up visit for management of stage IIIb colon cancer Problems and complaints are listed below: Patient is on adjuvant FOLFOX.   Patient has no new complaints today.  Tolerates well. [place] any numbness tingling. No nausea vomiting diarrhea.   Review of systems- Review of Systems  Constitutional: Negative for appetite change, chills, fatigue, fever and unexpected weight change.  HENT:   Negative for hearing loss and voice change.   Eyes: Negative for eye problems.  Respiratory: Negative for chest tightness, cough and shortness of breath.   Cardiovascular: Negative for chest pain.  Gastrointestinal: Negative for abdominal distention, abdominal pain, blood in stool and constipation.  Endocrine: Negative for hot flashes.  Genitourinary: Negative for difficulty urinating, dysuria and frequency.   Musculoskeletal: Negative for arthralgias.  Skin: Negative for itching and wound.  Neurological: Negative for extremity weakness, headaches and light-headedness.  Hematological: Negative for adenopathy.  Psychiatric/Behavioral: Negative for confusion.    No Known Allergies  Patient Active Problem List   Diagnosis Date Noted  . Encounter for antineoplastic chemotherapy 09/03/2020  . Encounter for central line placement   . Loss of weight 02/15/2020  . Goals of care, counseling/discussion 02/15/2020  . Malignant neoplasm of sigmoid colon ([place]) 02/15/2020  . Pressure injury of skin 02/03/2020  . Bowel obstruction ([place]) 01/29/2020  . Iron deficiency anemia due to chronic blood loss 11/22/2019  . COVID-19 virus detected 11/20/2019  .  Symptomatic anemia 11/20/2019  . Hypothyroidism 11/20/2019  . Iron deficiency anemia 11/20/2019  . GIB (gastrointestinal bleeding) 11/20/2019     Past Medical History:  Diagnosis Date  . Anemia   . Colon cancer ([place]) 04/2020  . Hypothyroidism   . Thyroid disease      Past Surgical History:  Procedure Laterality Date  . COLONOSCOPY WITH PROPOFOL N/A 11/21/2019   Procedure: COLONOSCOPY WITH PROPOFOL;  Surgeon: Lin Landsman, MD;  Location: [facility] ENDOSCOPY;  Service: Gastroenterology;  Laterality: N/A;  . CYSTOSCOPY WITH STENT PLACEMENT Left 01/30/2020   Procedure: CYSTOSCOPY WITH STENT PLACEMENT;  Surgeon: Hollice Espy, MD;  Location: [facility] ORS;  Service: Urology;  Laterality: Left;  . ESOPHAGOGASTRODUODENOSCOPY (EGD) WITH PROPOFOL N/A 11/21/2019   Procedure: ESOPHAGOGASTRODUODENOSCOPY (EGD) WITH PROPOFOL;  Surgeon: Lin Landsman, MD;  Location: [facility] ENDOSCOPY;  Service: Gastroenterology;  Laterality: N/A;  . IR RADIOLOGIST EVAL & MGMT  07/03/2020  . IR RADIOLOGIST EVAL & MGMT  07/12/2020  . PARTIAL COLECTOMY N/A 01/30/2020   Procedure: PARTIAL COLECTOMY Sigmoid;  Surgeon: [name], MD;  Location: [place] ORS;  Service: General;  Laterality: N/A;  . PORTACATH PLACEMENT Left 03/28/2020   Procedure: INSERTION PORT-A-CATH;  Surgeon: [name], MD;  Location: [facility] ORS;  Service: General;  Laterality: Left;  . PORTACATH PLACEMENT N/A 08/29/2020   Procedure: INSERTION PORT-A-CATH, chemotherapy;  Surgeon: [name], MD;  Location: [facility] ORS;  Service: General;  Laterality: N/A;    Social History   Socioeconomic History  . Marital status: Single    Spouse name: Not on file  . Number of children: Not on file  . Years of education: Not on file  . Highest education level: Not on file  Occupational History  . Occupation: Factory  Tobacco Use  . Smoking status: Never Smoker  . Smokeless tobacco: Never Used  Vaping Use  . Vaping Use: Never used  Substance and  Sexual Activity  . Alcohol use: Never  . Drug use: Never  . Sexual activity: Not on file  Other Topics Concern  . Not on file  Social History Narrative   Lives at home with significant other   Social Determinants of Health   Financial Resource Strain: Not on file  Food Insecurity: Not on file  Transportation Needs: Not on file  Physical Activity: Not on file  Stress: Not on file  Social Connections: Not on file  Intimate Partner Violence: Not on file     Family History  Problem Relation Age of Onset  . Diabetes Sister      Current Outpatient Medications:  .  B-D 3CC LUER-LOK SYR 25GX1" 25G X 1" 3 ML MISC, Use with B12 injection, Disp: , Rfl:  .  cyanocobalamin (,VITAMIN B-12,) 1000 MCG/ML injection, Inject 1,000 mcg into the muscle every 30 (thirty) days., Disp: , Rfl:  .  docusate sodium (COLACE) 100 MG capsule, Take 1 capsule (100 mg total) by mouth daily., Disp: 30 capsule, Rfl: 1 .  hydrocortisone cream 0.5 %, Apply 1 application topically 2 (two) times daily. Until rash is healed., Disp: 30 g, Rfl: 0 .  ibuprofen (ADVIL) 800 MG tablet, Take 1 tablet (800 mg total) by mouth every 8 (eight) hours as needed., Disp: 30 tablet, Rfl: 0 .  levothyroxine (SYNTHROID) 137 MCG tablet, Take 137 mcg by mouth daily before breakfast. , Disp: , Rfl:  .  lidocaine-prilocaine (EMLA) cream, Apply to affected area once, Disp: 30 g, Rfl: 3 .  magic mouthwash w/lidocaine SOLN, Take 5  mLs by mouth 4 (four) times daily as needed for mouth pain. Sig: Swish/Spit 5-10 ml four times a day as needed. Dispense 480 ml. 1RF, Disp: 480 mL, Rfl: 1 .  nystatin (MYCOSTATIN/NYSTOP) powder, Apply 1 application topically 3 (three) times daily., Disp: 30 g, Rfl: 0 .  ondansetron (ZOFRAN) 8 MG tablet, Take 1 tablet (8 mg total) by mouth 2 (two) times daily as needed for refractory nausea / vomiting. Start on day 3 after chemotherapy., Disp: 30 tablet, Rfl: 1 .  polyethylene glycol (MIRALAX) 17 g packet, Take 17 g  by mouth daily as needed., Disp: 14 each, Rfl: 0 .  prochlorperazine (COMPAZINE) 10 MG tablet, Take 1 tablet (10 mg total) by mouth every 6 (six) hours as needed (Nausea or vomiting)., Disp: 30 tablet, Rfl: 1 No current facility-administered medications for this visit.  Facility-Administered Medications Ordered in Other Visits:  .  sodium chloride flush (NS) 0.9 % injection 10 mL, 10 mL, Intravenous, Once, Earlie Server, MD   Physical exam:  Vitals:   11/12/20 0848  BP: 130/83  Pulse: 63  Resp: 18  Temp: 97.9 F (36.6 C)  Weight: 196 lb 14.4 oz (89.3 kg)   Physical Exam Constitutional:      General: She is not in acute distress.    Appearance: She is obese.     Comments: Patient walks independently  HENT:     Head: Normocephalic and atraumatic.     Mouth/Throat:     Pharynx: No oropharyngeal exudate.  Eyes:     General: No scleral icterus.    Pupils: Pupils are equal, round, and reactive to light.  Cardiovascular:     Rate and Rhythm: Normal rate and regular rhythm.     Heart sounds: No murmur heard.   Pulmonary:     Effort: Pulmonary effort is normal. No respiratory distress.  Abdominal:     General: There is no distension.     Palpations: Abdomen is soft.     Tenderness: There is no abdominal tenderness.  Musculoskeletal:        General: Normal range of motion.     Cervical back: Normal range of motion and neck supple.  Skin:    General: Skin is warm and dry.     Findings: No erythema.     Comments: Right side anterior chest wall + Mediport  Neurological:     Mental Status: She is alert and oriented to person, place, and time.  Psychiatric:        Mood and Affect: Affect normal.       CMP Latest Ref Rng & Units 10/29/2020  Glucose 70 - 99 mg/dL 138(H)  BUN 6 - 20 mg/dL 10  Creatinine 0.44 - 1.00 mg/dL 0.52  Sodium 135 - 145 mmol/L 138  Potassium 3.5 - 5.1 mmol/L 3.8  Chloride 98 - 111 mmol/L 104  CO2 22 - 32 mmol/L 24  Calcium 8.9 - 10.3 mg/dL 9.1  Total  Protein 6.5 - 8.1 g/dL 7.3  Total Bilirubin 0.3 - 1.2 mg/dL 0.7  Alkaline Phos 38 - 126 U/L 123  AST 15 - 41 U/L 33  ALT 0 - 44 U/L 23   CBC Latest Ref Rng & Units 11/12/2020  WBC 4.0 - 10.5 K/uL 3.5(L)  Hemoglobin 12.0 - 15.0 g/dL 11.8(L)  Hematocrit 36.0 - 46.0 % 35.2(L)  Platelets 150 - 400 K/uL 97(L)    RADIOGRAPHIC STUDIES: I have personally reviewed the radiological images as listed and agreed with the findings  in the report. CT CHEST ABDOMEN PELVIS W CONTRAST  Result Date: 08/28/2020 CLINICAL DATA:  Colon cancer.  Restaging. EXAM: CT CHEST, ABDOMEN, AND PELVIS WITH CONTRAST TECHNIQUE: Multidetector CT imaging of the chest, abdomen and pelvis was performed following the standard protocol during bolus administration of intravenous contrast. CONTRAST:  143m OMNIPAQUE IOHEXOL 300 MG/ML  SOLN COMPARISON:  Abdomen/pelvis CT 07/03/2020 FINDINGS: CT CHEST FINDINGS Cardiovascular: The heart size is normal. No substantial pericardial effusion. No thoracic aortic aneurysm. Mediastinum/Nodes: No mediastinal lymphadenopathy. There is no hilar lymphadenopathy. The esophagus has normal imaging features. There is no axillary lymphadenopathy. Lungs/Pleura: No suspicious pulmonary nodule or mass. No focal airspace consolidation. Subsegmental atelectasis noted posterior left base. No pleural effusion. Musculoskeletal: No worrisome lytic or sclerotic osseous abnormality. CT ABDOMEN PELVIS FINDINGS Hepatobiliary: No suspicious focal abnormality within the liver parenchyma. There is no evidence for gallstones, gallbladder wall thickening, or pericholecystic fluid. No intrahepatic or extrahepatic biliary dilation. Pancreas: No focal mass lesion. No dilatation of the main duct. No intraparenchymal cyst. No peripancreatic edema. Spleen: No splenomegaly. No focal mass lesion. Adrenals/Urinary Tract: No adrenal nodule or mass. Kidneys unremarkable. No evidence for hydroureter. The urinary bladder appears normal for the  degree of distention. Stomach/Bowel: Stomach is unremarkable. No gastric wall thickening. No evidence of outlet obstruction. Duodenum is normally positioned as is the ligament of Treitz. No small bowel wall thickening. No small bowel dilatation. The terminal ileum is normal. The appendix is normal. Left hemicolectomy. Vascular/Lymphatic: No abdominal aortic aneurysm. There is no gastrohepatic or hepatoduodenal ligament lymphadenopathy. No retroperitoneal or mesenteric lymphadenopathy. No pelvic sidewall lymphadenopathy. Reproductive: The uterus is unremarkable.  There is no adnexal mass. Other: No intraperitoneal free fluid. Scarring noted right lower quadrant. Left percutaneous peritoneal drain seen previously has been removed in the interval. Scarring in the anterior pelvis noted with near complete resolution of extraperitoneal edema in the lower abdomen/pelvis. Musculoskeletal: No worrisome lytic or sclerotic osseous abnormality. IMPRESSION: 1. No evidence for metastatic disease in the chest, abdomen, or pelvis. 2. Interval removal of the left percutaneous peritoneal drain with near complete resolution of extraperitoneal edema in the lower abdomen/pelvis. 3. Left hemicolectomy. 4. Aortic Atherosclerosis (ICD10-I70.0). Electronically Signed   By: EMisty StanleyM.D.   On: 08/28/2020 08:26   DG CHEST PORT 1 VIEW  Result Date: 08/29/2020 CLINICAL DATA:  Post Port-A-Cath insertion EXAM: PORTABLE CHEST 1 VIEW COMPARISON:  Portable exam 0856 hours compared to 03/28/2020 FINDINGS: RIGHT jugular Port-A-Cath with tip projecting over cavoatrial junction. Enlargement of cardiac silhouette with vascular congestion. Mediastinal contours normal for slight degree of rotation to the LEFT. Decreased lung volumes with mild bibasilar atelectasis. Cannot exclude minimal perihilar infiltrate. No pleural effusion or pneumothorax. Bones unremarkable. IMPRESSION: No pneumothorax following RIGHT jugular Port-A-Cath insertion. Mild  bibasilar atelectasis and question minimal perihilar infiltrate. Electronically Signed   By: MLavonia DanaM.D.   On: 08/29/2020 09:10   DG C-Arm 1-60 Min-No Report  Result Date: 08/29/2020 Fluoroscopy was utilized by the requesting physician.  No radiographic interpretation.    Assessment and plan-  1. [place]   2. Encounter for antineoplastic chemotherapy   3. Anemia due to antineoplastic chemotherapy   4. Chemotherapy-induced thrombocytopenia   Cancer Staging Malignant neoplasm of sigmoid colon ([facility] Staging form: Colon and Rectum, AJCC 8th Edition - Pathologic: Stage IIIB (pT3, pN1c, cM0) - Signed by YEarlie Server MD on 02/15/2020  #Stage IIIb colon cancer- 01/30/2020 pT3 pN1c, MMR intact  On adjuvant FOLFOX, bolus 5-FU  omitted starting cycle 2.   Labs reviewed and discussed with patient Counts acceptable to proceed with FOLFOX treatment today-omit bolus 5-FU.   #Constipation, continue current bowel regimen with Colace 100 mg twice daily.  Add MiraLAX daily as needed. #Anemia,  Chemotherapy-induced.  Hemoglobin is 11.8, stable.. #Mild thrombocytopenia, platelet count has dropped to 97,000.  No bleeding events.  Close monitor. #Morbid obesity, recommend exercise as tolerated.  Healthy diet and lifestyle modification Spanish interpretor was present for the entire encounter.  Supportive care measures are necessary for patient well-being and will be provided as necessary. We spent sufficient time to discuss many aspect of care, questions were answered to patient's satisfaction.   Follow-up in 2 week for evaluation prior to next cycle treatments Earlie Server, MD, PhD Hematology Oncology [facility] at [facility] Pager- [phone] 11/12/2020

## 2020-11-14 ENCOUNTER — Inpatient Hospital Stay: Payer: Self-pay

## 2020-11-14 ENCOUNTER — Other Ambulatory Visit: Payer: Self-pay

## 2020-11-14 DIAGNOSIS — C187 Malignant neoplasm of sigmoid colon: Secondary | ICD-10-CM

## 2020-11-14 MED ORDER — HEPARIN SOD (PORK) LOCK FLUSH 100 UNIT/ML IV SOLN
INTRAVENOUS | Status: AC
  Filled 2020-11-14: qty 5

## 2020-11-14 MED ORDER — HEPARIN SOD (PORK) LOCK FLUSH 100 UNIT/ML IV SOLN
500.0000 [IU] | Freq: Once | INTRAVENOUS | Status: AC | PRN
  Administered 2020-11-14: 500 [IU]
  Filled 2020-11-14: qty 5

## 2020-11-14 MED ORDER — SODIUM CHLORIDE 0.9% FLUSH
10.0000 mL | INTRAVENOUS | Status: DC | PRN
  Administered 2020-11-14: 10 mL
  Filled 2020-11-14: qty 10

## 2020-11-26 ENCOUNTER — Inpatient Hospital Stay: Payer: Self-pay

## 2020-11-26 ENCOUNTER — Inpatient Hospital Stay (HOSPITAL_BASED_OUTPATIENT_CLINIC_OR_DEPARTMENT_OTHER): Payer: Self-pay | Admitting: Oncology

## 2020-11-26 ENCOUNTER — Encounter: Payer: Self-pay | Admitting: Oncology

## 2020-11-26 ENCOUNTER — Other Ambulatory Visit: Payer: Self-pay

## 2020-11-26 VITALS — BP 128/81 | HR 72 | Temp 97.1°F | Wt 197.2 lb

## 2020-11-26 VITALS — Resp 20

## 2020-11-26 DIAGNOSIS — D6481 Anemia due to antineoplastic chemotherapy: Secondary | ICD-10-CM

## 2020-11-26 DIAGNOSIS — C187 Malignant neoplasm of sigmoid colon: Secondary | ICD-10-CM

## 2020-11-26 DIAGNOSIS — T451X5A Adverse effect of antineoplastic and immunosuppressive drugs, initial encounter: Secondary | ICD-10-CM

## 2020-11-26 DIAGNOSIS — Z5111 Encounter for antineoplastic chemotherapy: Secondary | ICD-10-CM

## 2020-11-26 DIAGNOSIS — D6959 Other secondary thrombocytopenia: Secondary | ICD-10-CM

## 2020-11-26 LAB — CBC WITH DIFFERENTIAL/PLATELET
Abs Immature Granulocytes: 0.01 10*3/uL (ref 0.00–0.07)
Basophils Absolute: 0 10*3/uL (ref 0.0–0.1)
Basophils Relative: 0 %
Eosinophils Absolute: 0.1 10*3/uL (ref 0.0–0.5)
Eosinophils Relative: 2 %
HCT: 36.6 % (ref 36.0–46.0)
Hemoglobin: 12.1 g/dL (ref 12.0–15.0)
Immature Granulocytes: 0 %
Lymphocytes Relative: 35 %
Lymphs Abs: 1.2 10*3/uL (ref 0.7–4.0)
MCH: 29.2 pg (ref 26.0–34.0)
MCHC: 33.1 g/dL (ref 30.0–36.0)
MCV: 88.2 fL (ref 80.0–100.0)
Monocytes Absolute: 0.5 10*3/uL (ref 0.1–1.0)
Monocytes Relative: 13 %
Neutro Abs: 1.7 10*3/uL (ref 1.7–7.7)
Neutrophils Relative %: 50 %
Platelets: 105 10*3/uL — ABNORMAL LOW (ref 150–400)
RBC: 4.15 MIL/uL (ref 3.87–5.11)
RDW: 16.3 % — ABNORMAL HIGH (ref 11.5–15.5)
WBC: 3.5 10*3/uL — ABNORMAL LOW (ref 4.0–10.5)
nRBC: 0 % (ref 0.0–0.2)

## 2020-11-26 LAB — COMPREHENSIVE METABOLIC PANEL
ALT: 20 U/L (ref 0–44)
AST: 35 U/L (ref 15–41)
Albumin: 3.8 g/dL (ref 3.5–5.0)
Alkaline Phosphatase: 100 U/L (ref 38–126)
Anion gap: 9 (ref 5–15)
BUN: 10 mg/dL (ref 6–20)
CO2: 26 mmol/L (ref 22–32)
Calcium: 9.2 mg/dL (ref 8.9–10.3)
Chloride: 101 mmol/L (ref 98–111)
Creatinine, Ser: 0.55 mg/dL (ref 0.44–1.00)
GFR, Estimated: >60 mL/min (ref >60)
Glucose, Bld: 138 mg/dL — ABNORMAL HIGH (ref 70–99)
Potassium: 3.4 mmol/L — ABNORMAL LOW (ref 3.5–5.1)
Sodium: 136 mmol/L (ref 135–145)
Total Bilirubin: 0.5 mg/dL (ref 0.3–1.2)
Total Protein: 7.3 g/dL (ref 6.5–8.1)

## 2020-11-26 MED ORDER — DEXTROSE 5 % IV SOLN
Freq: Once | INTRAVENOUS | Status: AC
  Filled 2020-11-26: qty 250

## 2020-11-26 MED ORDER — LEUCOVORIN CALCIUM INJECTION 350 MG
750.0000 mg | Freq: Once | INTRAVENOUS | Status: AC
  Administered 2020-11-26: 750 mg via INTRAVENOUS
  Filled 2020-11-26: qty 37.5

## 2020-11-26 MED ORDER — SODIUM CHLORIDE 0.9% FLUSH
10.0000 mL | INTRAVENOUS | Status: DC | PRN
  Administered 2020-11-26: 10 mL via INTRAVENOUS
  Filled 2020-11-26: qty 10

## 2020-11-26 MED ORDER — SODIUM CHLORIDE 0.9% FLUSH
10.0000 mL | INTRAVENOUS | Status: DC | PRN
  Administered 2020-11-26: 10 mL
  Filled 2020-11-26: qty 10

## 2020-11-26 MED ORDER — PALONOSETRON HCL INJECTION 0.25 MG/5ML
0.2500 mg | Freq: Once | INTRAVENOUS | Status: AC
  Administered 2020-11-26: 0.25 mg via INTRAVENOUS
  Filled 2020-11-26: qty 5

## 2020-11-26 MED ORDER — SODIUM CHLORIDE 0.9 % IV SOLN
2400.0000 mg/m2 | INTRAVENOUS | Status: DC
  Administered 2020-11-26: 4400 mg via INTRAVENOUS
  Filled 2020-11-26: qty 88

## 2020-11-26 MED ORDER — OXALIPLATIN CHEMO INJECTION 100 MG/20ML
150.0000 mg | Freq: Once | INTRAVENOUS | Status: AC
  Administered 2020-11-26: 150 mg via INTRAVENOUS
  Filled 2020-11-26: qty 20

## 2020-11-26 MED ORDER — SODIUM CHLORIDE 0.9 % IV SOLN
10.0000 mg | Freq: Once | INTRAVENOUS | Status: AC
  Administered 2020-11-26: 10 mg via INTRAVENOUS
  Filled 2020-11-26: qty 10

## 2020-11-26 NOTE — Progress Notes (Signed)
Hematology/Oncology Progress Note [facility] [phone] [phone]  Patient Care Team: Debra Lank, MD as PCP - General (Family Medicine) Debra Junker, RN as Registered Nurse Debra Demark, RN as Registered Nurse   Name of the patient: Debra [name]  [id number]  05/26/1966  Date of visit: 11/26/20  PERTINENT ONCOLOGY HISTORY Debra [name] is a 55 y.o.afemale who has above oncology history reviewed by me today presented for follow up visit for management of Stage IIIB Colon cancer seen by me on 11/21/2019 during her hospitalization. Patient has a history of Covid positivity. Severe iron deficiency status post IV Venofer treatment. Colonoscopy 11/21/2019 showed a malignant appearing severe stenosis found in the sigmoid colon at 25 cm proximal to the anus and was nontransversed.  Biopsy was taken.  Surgical pathology is pending  Nonbleeding external hemorrhoids Biopsy was not diagnostic 01/03/2020 virtual colonoscopy showed long segment area of wall thickening and annular constriction within the sigmoid colon in the area of previous concern most compatible with annular malignancy.  Remainder of the colon evaluation is somewhat limited due to large amount of retained stool and incomplete distention proximal to the annular constricting lesion. 01/30/2020 Patient underwent left hemicolectomy by Dr. Celine Ahr and a biopsy showed invasive adenocarcinoma, poorly differentiated.  37 lymph nodes negative for malignancy.  Grade 3, all margins are uninvolved.  Perineural invasion present.  Tumor deposit present. pT3 pN1c cM0 Checks x-ray did not show any lung mass. #Postop, patient developed postop ileus and she required TPN, PICC and NG tube placement.  Postoperation day 9 she continued to have increased leukocytosis and was subsequently found to have a midline wound infection to the inferior portion of the wound.  Patient was treated with  antibiotics.  07/03/2020 CT abdomen pelvis w contrast showed well positioned drainage catheter with apparent resolution of the intra-abdominal abscess. 07/10/2020 seen by [facility] and patient had drainage catheter removed by IR on 07/12/2020   INTERVAL HISTORY Debra [name] is a 55 y.o. female who has above history reviewed by me today presents for follow up visit for management of stage IIIb colon cancer Problems and complaints are listed below: Patient is on adjuvant FOLFOX.  Patient tolerates well. She reports that appetite has not been well since last visit. Her weight has not changed much. No nausea vomiting diarrhea.  Intermittent mild numbness or tingling.  Usually precipitated by low temperature.   Review of systems- Review of Systems  Constitutional: Negative for appetite change, chills, fatigue, fever and unexpected weight change.  HENT:   Negative for hearing loss and voice change.   Eyes: Negative for eye problems.  Respiratory: Negative for chest tightness, cough and shortness of breath.   Cardiovascular: Negative for chest pain.  Gastrointestinal: Negative for abdominal distention, abdominal pain, blood in stool and constipation.  Endocrine: Negative for hot flashes.  Genitourinary: Negative for difficulty urinating, dysuria and frequency.   Musculoskeletal: Negative for arthralgias.  Skin: Negative for itching and wound.  Neurological: Negative for extremity weakness, headaches and light-headedness.  Hematological: Negative for adenopathy.  Psychiatric/Behavioral: Negative for confusion.    No Known Allergies  Patient Active Problem List   Diagnosis Date Noted  . Encounter for antineoplastic chemotherapy 09/03/2020  . Encounter for central line placement   . Loss of weight 02/15/2020  . Goals of care, counseling/discussion 02/15/2020  . Malignant neoplasm of sigmoid colon ([place]) 02/15/2020  . Pressure injury of skin 02/03/2020  . Bowel obstruction ([place])  01/29/2020  .  Iron deficiency anemia due to chronic blood loss 11/22/2019  . COVID-19 virus detected 11/20/2019  . Symptomatic anemia 11/20/2019  . Hypothyroidism 11/20/2019  . Iron deficiency anemia 11/20/2019  . GIB (gastrointestinal bleeding) 11/20/2019     Past Medical History:  Diagnosis Date  . Anemia   . Colon cancer ([place]) 04/2020  . Hypothyroidism   . Thyroid disease      Past Surgical History:  Procedure Laterality Date  . COLONOSCOPY WITH PROPOFOL N/A 11/21/2019   Procedure: COLONOSCOPY WITH PROPOFOL;  Surgeon: Lin Landsman, MD;  Location[facility] ENDOSCOPY;  Service: Gastroenterology;  Laterality: N/A;  . CYSTOSCOPY WITH STENT PLACEMENT Left 01/30/2020   Procedure: CYSTOSCOPY WITH STENT PLACEMENT;  Surgeon: Hollice Espy, MD;  Location: [facility] ORS;  Service: Urology;  Laterality: Left;  . ESOPHAGOGASTRODUODENOSCOPY (EGD) WITH PROPOFOL N/A 11/21/2019   Procedure: ESOPHAGOGASTRODUODENOSCOPY (EGD) WITH PROPOFOL;  Surgeon: Lin Landsman, MD;  Location: [facility] ENDOSCOPY;  Service: Gastroenterology;  Laterality: N/A;  . IR RADIOLOGIST EVAL & MGMT  07/03/2020  . IR RADIOLOGIST EVAL & MGMT  07/12/2020  . PARTIAL COLECTOMY N/A 01/30/2020   Procedure: PARTIAL COLECTOMY Sigmoid;  Surgeon: [name], MD;  Location: [place] ORS;  Service: General;  Laterality: N/A;  . PORTACATH PLACEMENT Left 03/28/2020   Procedure: INSERTION PORT-A-CATH;  Surgeon: [name], MD;  Location: [facility] ORS;  Service: General;  Laterality: Left;  . PORTACATH PLACEMENT N/A 08/29/2020   Procedure: INSERTION PORT-A-CATH, chemotherapy;  Surgeon: [name], MD;  Location: [facility] ORS;  Service: General;  Laterality: N/A;    Social History   Socioeconomic History  . Marital status: Single    Spouse name: Not on file  . Number of children: Not on file  . Years of education: Not on file  . Highest education level: Not on file  Occupational History  . Occupation: Factory  Tobacco Use  .  Smoking status: Never Smoker  . Smokeless tobacco: Never Used  Vaping Use  . Vaping Use: Never used  Substance and Sexual Activity  . Alcohol use: Never  . Drug use: Never  . Sexual activity: Not on file  Other Topics Concern  . Not on file  Social History Narrative   Lives at home with significant other   Social Determinants of Health   Financial Resource Strain: Not on file  Food Insecurity: Not on file  Transportation Needs: Not on file  Physical Activity: Not on file  Stress: Not on file  Social Connections: Not on file  Intimate Partner Violence: Not on file     Family History  Problem Relation Age of Onset  . Diabetes Sister      Current Outpatient Medications:  .  cyanocobalamin (,VITAMIN B-12,) 1000 MCG/ML injection, Inject 1,000 mcg into the muscle every 30 (thirty) days., Disp: , Rfl:  .  docusate sodium (COLACE) 100 MG capsule, Take 1 capsule (100 mg total) by mouth daily., Disp: 30 capsule, Rfl: 1 .  ibuprofen (ADVIL) 800 MG tablet, Take 1 tablet (800 mg total) by mouth every 8 (eight) hours as needed., Disp: 30 tablet, Rfl: 0 .  levothyroxine (SYNTHROID) 137 MCG tablet, Take 137 mcg by mouth daily before breakfast. , Disp: , Rfl:  .  lidocaine-prilocaine (EMLA) cream, Apply to affected area once, Disp: 30 g, Rfl: 3 .  magic mouthwash w/lidocaine SOLN, Take 5 mLs by mouth 4 (four) times daily as needed for mouth pain. Sig: Swish/Spit 5-10 ml four times a day as needed. Dispense 480 ml. 1RF, Disp:  480 mL, Rfl: 1 .  nystatin (MYCOSTATIN/NYSTOP) powder, Apply 1 application topically 3 (three) times daily., Disp: 30 g, Rfl: 0 .  ondansetron (ZOFRAN) 8 MG tablet, Take 1 tablet (8 mg total) by mouth 2 (two) times daily as needed for refractory nausea / vomiting. Start on day 3 after chemotherapy., Disp: 30 tablet, Rfl: 1 .  prochlorperazine (COMPAZINE) 10 MG tablet, Take 1 tablet (10 mg total) by mouth every 6 (six) hours as needed (Nausea or vomiting)., Disp: 30 tablet,  Rfl: 1 .  B-D 3CC LUER-LOK SYR 25GX1" 25G X 1" 3 ML MISC, Use with B12 injection, Disp: , Rfl:  .  hydrocortisone cream 0.5 %, Apply 1 application topically 2 (two) times daily. Until rash is healed. (Patient not taking: Reported on 11/26/2020), Disp: 30 g, Rfl: 0 .  polyethylene glycol (MIRALAX) 17 g packet, Take 17 g by mouth daily as needed. (Patient not taking: Reported on 11/26/2020), Disp: 14 each, Rfl: 0 No current facility-administered medications for this visit.  Facility-Administered Medications Ordered in Other Visits:  .  fluorouracil (ADRUCIL) 4,400 mg in sodium chloride 0.9 % 62 mL chemo infusion, 2,400 mg/m2 (Treatment Plan Recorded), Intravenous, 1 day or 1 dose, Earlie Server, MD, 4,400 mg at 11/26/20 1325 .  sodium chloride flush (NS) 0.9 % injection 10 mL, 10 mL, Intravenous, PRN, Earlie Server, MD, 10 mL at 11/26/20 9326 .  sodium chloride flush (NS) 0.9 % injection 10 mL, 10 mL, Intracatheter, PRN, Earlie Server, MD, 10 mL at 11/26/20 1325   Physical exam:  Vitals:   11/26/20 0846  BP: 128/81  Pulse: 72  Temp: (!) 97.1 F (36.2 C)  Weight: 197 lb 3.2 oz (89.4 kg)   Physical Exam Constitutional:      General: She is not in acute distress.    Appearance: She is obese. She is not diaphoretic.     Comments: Patient walks independently  HENT:     Head: Normocephalic and atraumatic.     Nose: Nose normal.     Mouth/Throat:     Pharynx: No oropharyngeal exudate.  Eyes:     General: No scleral icterus.    Pupils: Pupils are equal, round, and reactive to light.  Cardiovascular:     Rate and Rhythm: Normal rate and regular rhythm.     Heart sounds: No murmur heard.   Pulmonary:     Effort: Pulmonary effort is normal. No respiratory distress.     Breath sounds: No rales.  Chest:     Chest wall: No tenderness.  Abdominal:     General: There is no distension.     Palpations: Abdomen is soft.     Tenderness: There is no abdominal tenderness.  Musculoskeletal:        General:  Normal range of motion.     Cervical back: Normal range of motion and neck supple.  Skin:    General: Skin is warm and dry.     Findings: No erythema.     Comments: Right side anterior chest wall + Mediport  Neurological:     Mental Status: She is alert and oriented to person, place, and time.     Cranial Nerves: No cranial nerve deficit.     Motor: No abnormal muscle tone.     Coordination: Coordination normal.  Psychiatric:        Mood and Affect: Affect normal.       CMP Latest Ref Rng & Units 11/26/2020  Glucose 70 - 99 mg/dL  138(H)  BUN 6 - 20 mg/dL 10  Creatinine 0.44 - 1.00 mg/dL 0.55  Sodium 135 - 145 mmol/L 136  Potassium 3.5 - 5.1 mmol/L 3.4(L)  Chloride 98 - 111 mmol/L 101  CO2 22 - 32 mmol/L 26  Calcium 8.9 - 10.3 mg/dL 9.2  Total Protein 6.5 - 8.1 g/dL 7.3  Total Bilirubin 0.3 - 1.2 mg/dL 0.5  Alkaline Phos 38 - 126 U/L 100  AST 15 - 41 U/L 35  ALT 0 - 44 U/L 20   CBC Latest Ref Rng & Units 11/26/2020  WBC 4.0 - 10.5 K/uL 3.5(L)  Hemoglobin 12.0 - 15.0 g/dL 12.1  Hematocrit 36.0 - 46.0 % 36.6  Platelets 150 - 400 K/uL 105(L)    RADIOGRAPHIC STUDIES: I have personally reviewed the radiological images as listed and agreed with the findings in the report. DG CHEST PORT 1 VIEW  Result Date: 08/29/2020 CLINICAL DATA:  Post Port-A-Cath insertion EXAM: PORTABLE CHEST 1 VIEW COMPARISON:  Portable exam 0856 hours compared to 03/28/2020 FINDINGS: RIGHT jugular Port-A-Cath with tip projecting over cavoatrial junction. Enlargement of cardiac silhouette with vascular congestion. Mediastinal contours normal for slight degree of rotation to the LEFT. Decreased lung volumes with mild bibasilar atelectasis. Cannot exclude minimal perihilar infiltrate. No pleural effusion or pneumothorax. Bones unremarkable. IMPRESSION: No pneumothorax following RIGHT jugular Port-A-Cath insertion. Mild bibasilar atelectasis and question minimal perihilar infiltrate. Electronically Signed   By:  Lavonia Dana M.D.   On: 08/29/2020 09:10   DG C-Arm 1-60 Min-No Report  Result Date: 08/29/2020 Fluoroscopy was utilized by the requesting physician.  No radiographic interpretation.    Assessment and plan-  1. Malignant neoplasm of sigmoid colon ([place])   2. Encounter for antineoplastic chemotherapy   3. Chemotherapy-induced thrombocytopenia   4. Anemia due to antineoplastic chemotherapy   Cancer Staging Malignant neoplasm of sigmoid colon [facility]) Staging form: Colon and Rectum, AJCC 8th Edition - Pathologic: Stage IIIB (pT3, pN1c, cM0) - Signed by Earlie Server, MD on 02/15/2020  #Stage IIIb colon cancer- 01/30/2020 pT3 pN1c, MMR intact  On adjuvant FOLFOX, bolus 5-FU omitted starting cycle 2.   Labs reviewed and discussed with patient. Counts acceptable to proceed with FOLFOX treatment today-omit bolus 5-FU.   #Constipation, continue current bowel regimen bowel regimen with Colace 100 mg twice daily.  Add MiraLAX daily as needed. #Mild thrombocytopenia, platelet count has dropped to 10 5000..  No bleeding events.  Monitor #Morbid obesity, recommend exercise as tolerated.  Healthy diet and lifestyle modification Spanish interpretor was present for the entire encounter.  Supportive care measures are necessary for patient well-being and will be provided as necessary. We spent sufficient time to discuss many aspect of care, questions were answered to patient's satisfaction.   Follow-up in 2 week for evaluation prior to next cycle treatments Earlie Server, MD, PhD Hematology Oncology [facility] Pager- [phone] 11/26/2020

## 2020-11-28 ENCOUNTER — Inpatient Hospital Stay: Payer: Self-pay

## 2020-11-28 ENCOUNTER — Other Ambulatory Visit: Payer: Self-pay

## 2020-11-28 DIAGNOSIS — C187 Malignant neoplasm of sigmoid colon: Secondary | ICD-10-CM

## 2020-11-28 MED ORDER — HEPARIN SOD (PORK) LOCK FLUSH 100 UNIT/ML IV SOLN
500.0000 [IU] | Freq: Once | INTRAVENOUS | Status: AC | PRN
  Administered 2020-11-28: 500 [IU]
  Filled 2020-11-28: qty 5

## 2020-11-28 MED ORDER — SODIUM CHLORIDE 0.9% FLUSH
10.0000 mL | INTRAVENOUS | Status: DC | PRN
  Administered 2020-11-28: 10 mL
  Filled 2020-11-28: qty 10

## 2020-12-10 ENCOUNTER — Inpatient Hospital Stay: Payer: Self-pay | Attending: Oncology

## 2020-12-10 ENCOUNTER — Inpatient Hospital Stay: Payer: Self-pay

## 2020-12-10 ENCOUNTER — Encounter: Payer: Self-pay | Admitting: Oncology

## 2020-12-10 ENCOUNTER — Inpatient Hospital Stay (HOSPITAL_BASED_OUTPATIENT_CLINIC_OR_DEPARTMENT_OTHER): Payer: Self-pay | Admitting: Oncology

## 2020-12-10 ENCOUNTER — Other Ambulatory Visit: Payer: Self-pay

## 2020-12-10 VITALS — BP 125/78 | HR 78 | Temp 99.2°F | Resp 18

## 2020-12-10 DIAGNOSIS — C187 Malignant neoplasm of sigmoid colon: Secondary | ICD-10-CM

## 2020-12-10 DIAGNOSIS — Z5111 Encounter for antineoplastic chemotherapy: Secondary | ICD-10-CM | POA: Insufficient documentation

## 2020-12-10 DIAGNOSIS — D6481 Anemia due to antineoplastic chemotherapy: Secondary | ICD-10-CM | POA: Insufficient documentation

## 2020-12-10 DIAGNOSIS — Z8616 Personal history of COVID-19: Secondary | ICD-10-CM | POA: Insufficient documentation

## 2020-12-10 DIAGNOSIS — D6959 Other secondary thrombocytopenia: Secondary | ICD-10-CM | POA: Insufficient documentation

## 2020-12-10 DIAGNOSIS — E669 Obesity, unspecified: Secondary | ICD-10-CM

## 2020-12-10 DIAGNOSIS — E039 Hypothyroidism, unspecified: Secondary | ICD-10-CM | POA: Insufficient documentation

## 2020-12-10 DIAGNOSIS — Z79899 Other long term (current) drug therapy: Secondary | ICD-10-CM | POA: Insufficient documentation

## 2020-12-10 DIAGNOSIS — D509 Iron deficiency anemia, unspecified: Secondary | ICD-10-CM | POA: Insufficient documentation

## 2020-12-10 DIAGNOSIS — K5900 Constipation, unspecified: Secondary | ICD-10-CM | POA: Insufficient documentation

## 2020-12-10 DIAGNOSIS — T451X5A Adverse effect of antineoplastic and immunosuppressive drugs, initial encounter: Secondary | ICD-10-CM

## 2020-12-10 LAB — COMPREHENSIVE METABOLIC PANEL
ALT: 23 U/L (ref 0–44)
AST: 39 U/L (ref 15–41)
Albumin: 3.8 g/dL (ref 3.5–5.0)
Alkaline Phosphatase: 105 U/L (ref 38–126)
Anion gap: 9 (ref 5–15)
BUN: 9 mg/dL (ref 6–20)
CO2: 25 mmol/L (ref 22–32)
Calcium: 9.1 mg/dL (ref 8.9–10.3)
Chloride: 104 mmol/L (ref 98–111)
Creatinine, Ser: 0.45 mg/dL (ref 0.44–1.00)
GFR, Estimated: >60 mL/min (ref >60)
Glucose, Bld: 135 mg/dL — ABNORMAL HIGH (ref 70–99)
Potassium: 3.5 mmol/L (ref 3.5–5.1)
Sodium: 138 mmol/L (ref 135–145)
Total Bilirubin: 0.8 mg/dL (ref 0.3–1.2)
Total Protein: 7.3 g/dL (ref 6.5–8.1)

## 2020-12-10 LAB — CBC WITH DIFFERENTIAL/PLATELET
Abs Immature Granulocytes: 0 10*3/uL (ref 0.00–0.07)
Basophils Absolute: 0 10*3/uL (ref 0.0–0.1)
Basophils Relative: 1 %
Eosinophils Absolute: 0.1 10*3/uL (ref 0.0–0.5)
Eosinophils Relative: 2 %
HCT: 35.7 % — ABNORMAL LOW (ref 36.0–46.0)
Hemoglobin: 12 g/dL (ref 12.0–15.0)
Immature Granulocytes: 0 %
Lymphocytes Relative: 37 %
Lymphs Abs: 1.1 10*3/uL (ref 0.7–4.0)
MCH: 29.8 pg (ref 26.0–34.0)
MCHC: 33.6 g/dL (ref 30.0–36.0)
MCV: 88.6 fL (ref 80.0–100.0)
Monocytes Absolute: 0.5 10*3/uL (ref 0.1–1.0)
Monocytes Relative: 15 %
Neutro Abs: 1.4 10*3/uL — ABNORMAL LOW (ref 1.7–7.7)
Neutrophils Relative %: 45 %
Platelets: 94 10*3/uL — ABNORMAL LOW (ref 150–400)
RBC: 4.03 MIL/uL (ref 3.87–5.11)
RDW: 16.4 % — ABNORMAL HIGH (ref 11.5–15.5)
WBC: 3.1 10*3/uL — ABNORMAL LOW (ref 4.0–10.5)
nRBC: 0 % (ref 0.0–0.2)

## 2020-12-10 MED ORDER — HEPARIN SOD (PORK) LOCK FLUSH 100 UNIT/ML IV SOLN
500.0000 [IU] | Freq: Once | INTRAVENOUS | Status: DC
  Filled 2020-12-10: qty 5

## 2020-12-10 MED ORDER — PALONOSETRON HCL INJECTION 0.25 MG/5ML
0.2500 mg | Freq: Once | INTRAVENOUS | Status: AC
  Administered 2020-12-10: 0.25 mg via INTRAVENOUS
  Filled 2020-12-10: qty 5

## 2020-12-10 MED ORDER — LEUCOVORIN CALCIUM INJECTION 350 MG
750.0000 mg | Freq: Once | INTRAVENOUS | Status: AC
  Administered 2020-12-10: 750 mg via INTRAVENOUS
  Filled 2020-12-10: qty 35

## 2020-12-10 MED ORDER — DEXAMETHASONE SODIUM PHOSPHATE 100 MG/10ML IJ SOLN
10.0000 mg | Freq: Once | INTRAMUSCULAR | Status: AC
Start: 2020-12-10 — End: 2020-12-10
  Administered 2020-12-10: 10 mg via INTRAVENOUS
  Filled 2020-12-10: qty 10

## 2020-12-10 MED ORDER — SODIUM CHLORIDE 0.9 % IV SOLN
2400.0000 mg/m2 | INTRAVENOUS | Status: DC
  Administered 2020-12-10: 4400 mg via INTRAVENOUS
  Filled 2020-12-10: qty 88

## 2020-12-10 MED ORDER — SODIUM CHLORIDE 0.9% FLUSH
10.0000 mL | Freq: Once | INTRAVENOUS | Status: AC
  Administered 2020-12-10: 10 mL via INTRAVENOUS
  Filled 2020-12-10: qty 10

## 2020-12-10 MED ORDER — OXALIPLATIN CHEMO INJECTION 100 MG/20ML
150.0000 mg | Freq: Once | INTRAVENOUS | Status: AC
  Administered 2020-12-10: 150 mg via INTRAVENOUS
  Filled 2020-12-10: qty 10

## 2020-12-10 MED ORDER — DEXTROSE 5 % IV SOLN
Freq: Once | INTRAVENOUS | Status: AC
  Filled 2020-12-10: qty 250

## 2020-12-10 NOTE — Progress Notes (Signed)
Hematology/Oncology Progress Note [facility] Telephone:([phone] Fax:([phone]  Patient Care Team: [name], MD as PCP - General (Family Medicine) [name], RN as Registered Nurse [name], RN as Registered Nurse   Name of the patient: Debra [name]  [id number]  1966/08/15  Date of visit: 12/10/20  PERTINENT ONCOLOGY HISTORY Debra [name] is a 55 y.o.afemale who has above oncology history reviewed by me today presented for follow up visit for management of Stage IIIB Colon cancer seen by me on 11/21/2019 during her hospitalization. Patient has a history of Covid positivity. Severe iron deficiency status post IV Venofer treatment. Colonoscopy 11/21/2019 showed a malignant appearing severe stenosis found in the sigmoid colon at 25 cm proximal to the anus and was nontransversed.  Biopsy was taken.  Surgical pathology is pending  Nonbleeding external hemorrhoids Biopsy was not diagnostic 01/03/2020 virtual colonoscopy showed long segment area of wall thickening and annular constriction within the sigmoid colon in the area of previous concern most compatible with annular malignancy.  Remainder of the colon evaluation is somewhat limited due to large amount of retained stool and incomplete distention proximal to the annular constricting lesion. 01/30/2020 Patient underwent left hemicolectomy by Dr. Celine Ahr and a biopsy showed invasive adenocarcinoma, poorly differentiated.  37 lymph nodes negative for malignancy.  Grade 3, all margins are uninvolved.  Perineural invasion present.  Tumor deposit present. pT3 pN1c cM0 Checks x-ray did not show any lung mass. #Postop, patient developed postop ileus and she required TPN, PICC and NG tube placement.  Postoperation day 9 she continued to have increased leukocytosis and was subsequently found to have a midline wound infection to the inferior portion of the wound.  Patient was treated with  antibiotics.  07/03/2020 CT abdomen pelvis w contrast showed well positioned drainage catheter with apparent resolution of the intra-abdominal abscess. 07/10/2020 seen by [facility] and patient had drainage catheter removed by IR on 07/12/2020   INTERVAL HISTORY Debra [name] is a 55 y.o. female who has above history reviewed by me today presents for follow up visit for management of stage IIIb colon cancer Problems and complaints are listed below: Patient is on adjuvant FOLFOX.  Patient tolerates well. Patient reports mild neuropathy symptoms when exposed to cold temperature. Appetite is fair. No nausea vomiting diarrhea   Review of systems- Review of Systems  Constitutional: Negative for appetite change, chills, fatigue, fever and unexpected weight change.  HENT:   Negative for hearing loss and voice change.   Eyes: Negative for eye problems.  Respiratory: Negative for chest tightness, cough and shortness of breath.   Cardiovascular: Negative for chest pain.  Gastrointestinal: Negative for abdominal distention, abdominal pain, blood in stool and constipation.  Endocrine: Negative for hot flashes.  Genitourinary: Negative for difficulty urinating, dysuria and frequency.   Musculoskeletal: Negative for arthralgias.  Skin: Negative for itching and wound.  Neurological: Negative for extremity weakness, headaches and light-headedness.  Hematological: Negative for adenopathy.  Psychiatric/Behavioral: Negative for confusion.    No Known Allergies  Patient Active Problem List   Diagnosis Date Noted  . Encounter for antineoplastic chemotherapy 09/03/2020  . Encounter for central line placement   . Loss of weight 02/15/2020  . Goals of care, counseling/discussion 02/15/2020  . Malignant neoplasm of sigmoid colon ([place]) 02/15/2020  . Pressure injury of skin 02/03/2020  . Bowel obstruction ([place]) 01/29/2020  . Iron deficiency anemia due to chronic blood loss 11/22/2019  . COVID-19  virus detected 11/20/2019  .  Symptomatic anemia 11/20/2019  . Hypothyroidism 11/20/2019  . Iron deficiency anemia 11/20/2019  . GIB (gastrointestinal bleeding) 11/20/2019     Past Medical History:  Diagnosis Date  . Anemia   . Colon cancer ([place]) 04/2020  . Hypothyroidism   . Thyroid disease      Past Surgical History:  Procedure Laterality Date  . COLONOSCOPY WITH PROPOFOL N/A 11/21/2019   Procedure: COLONOSCOPY WITH PROPOFOL;  Surgeon: Lin Landsman, MD;  Location: [facility] ENDOSCOPY;  Service: Gastroenterology;  Laterality: N/A;  . CYSTOSCOPY WITH STENT PLACEMENT Left 01/30/2020   Procedure: CYSTOSCOPY WITH STENT PLACEMENT;  Surgeon: Hollice Espy, MD;  Location: [facility] ORS;  Service: Urology;  Laterality: Left;  . ESOPHAGOGASTRODUODENOSCOPY (EGD) WITH PROPOFOL N/A 11/21/2019   Procedure: ESOPHAGOGASTRODUODENOSCOPY (EGD) WITH PROPOFOL;  Surgeon: Lin Landsman, MD;  Location: [facility] ENDOSCOPY;  Service: Gastroenterology;  Laterality: N/A;  . IR RADIOLOGIST EVAL & MGMT  07/03/2020  . IR RADIOLOGIST EVAL & MGMT  07/12/2020  . PARTIAL COLECTOMY N/A 01/30/2020   Procedure: PARTIAL COLECTOMY Sigmoid;  Surgeon: [name], MD;  Location: [place] ORS;  Service: General;  Laterality: N/A;  . PORTACATH PLACEMENT Left 03/28/2020   Procedure: INSERTION PORT-A-CATH;  Surgeon: [name], MD;  Location: [facility] ORS;  Service: General;  Laterality: Left;  . PORTACATH PLACEMENT N/A 08/29/2020   Procedure: INSERTION PORT-A-CATH, chemotherapy;  Surgeon: [name], MD;  Location: [facility] ORS;  Service: General;  Laterality: N/A;    Social History   Socioeconomic History  . Marital status: Single    Spouse name: Not on file  . Number of children: Not on file  . Years of education: Not on file  . Highest education level: Not on file  Occupational History  . Occupation: Factory  Tobacco Use  . Smoking status: Never Smoker  . Smokeless tobacco: Never Used  Vaping Use  . Vaping  Use: Never used  Substance and Sexual Activity  . Alcohol use: Never  . Drug use: Never  . Sexual activity: Not on file  Other Topics Concern  . Not on file  Social History Narrative   Lives at home with significant other   Social Determinants of Health   Financial Resource Strain: Not on file  Food Insecurity: Not on file  Transportation Needs: Not on file  Physical Activity: Not on file  Stress: Not on file  Social Connections: Not on file  Intimate Partner Violence: Not on file     Family History  Problem Relation Age of Onset  . Diabetes Sister      Current Outpatient Medications:  .  B-D 3CC LUER-LOK SYR 25GX1" 25G X 1" 3 ML MISC, Use with B12 injection, Disp: , Rfl:  .  cyanocobalamin (,VITAMIN B-12,) 1000 MCG/ML injection, Inject 1,000 mcg into the muscle every 30 (thirty) days., Disp: , Rfl:  .  docusate sodium (COLACE) 100 MG capsule, Take 1 capsule (100 mg total) by mouth daily., Disp: 30 capsule, Rfl: 1 .  ibuprofen (ADVIL) 800 MG tablet, Take 1 tablet (800 mg total) by mouth every 8 (eight) hours as needed., Disp: 30 tablet, Rfl: 0 .  levothyroxine (SYNTHROID) 137 MCG tablet, Take 137 mcg by mouth daily before breakfast. , Disp: , Rfl:  .  lidocaine-prilocaine (EMLA) cream, Apply to affected area once, Disp: 30 g, Rfl: 3 .  magic mouthwash w/lidocaine SOLN, Take 5 mLs by mouth 4 (four) times daily as needed for mouth pain. Sig: Swish/Spit 5-10 ml four times a day as needed. Dispense  480 ml. 1RF, Disp: 480 mL, Rfl: 1 .  nystatin (MYCOSTATIN/NYSTOP) powder, Apply 1 application topically 3 (three) times daily., Disp: 30 g, Rfl: 0 .  ondansetron (ZOFRAN) 8 MG tablet, Take 1 tablet (8 mg total) by mouth 2 (two) times daily as needed for refractory nausea / vomiting. Start on day 3 after chemotherapy., Disp: 30 tablet, Rfl: 1 .  prochlorperazine (COMPAZINE) 10 MG tablet, Take 1 tablet (10 mg total) by mouth every 6 (six) hours as needed (Nausea or vomiting)., Disp: 30  tablet, Rfl: 1 .  hydrocortisone cream 0.5 %, Apply 1 application topically 2 (two) times daily. Until rash is healed. (Patient not taking: No sig reported), Disp: 30 g, Rfl: 0 .  polyethylene glycol (MIRALAX) 17 g packet, Take 17 g by mouth daily as needed. (Patient not taking: No sig reported), Disp: 14 each, Rfl: 0 No current facility-administered medications for this visit.  Facility-Administered Medications Ordered in Other Visits:  .  fluorouracil (ADRUCIL) 4,400 mg in sodium chloride 0.9 % 62 mL chemo infusion, 2,400 mg/m2 (Treatment Plan Recorded), Intravenous, 1 day or 1 dose, Earlie Server, MD .  heparin lock flush 100 unit/mL, 500 Units, Intravenous, Once, Earlie Server, MD .  leucovorin 750 mg in dextrose 5 % 250 mL infusion, 750 mg, Intravenous, Once, Earlie Server, MD, Last Rate: 144 mL/hr at 12/10/20 1041, 750 mg at 12/10/20 1041 .  oxaliplatin (ELOXATIN) 150 mg in dextrose 5 % 500 mL chemo infusion, 150 mg, Intravenous, Once, Earlie Server, MD, Last Rate: 265 mL/hr at 12/10/20 1039, 150 mg at 12/10/20 1039   Physical exam:  Vitals:   12/10/20 0906  BP: 125/78  Pulse: 78  Resp: 18  Temp: 99.2 F (37.3 C)  TempSrc: Tympanic  SpO2: 100%   Physical Exam Constitutional:      General: She is not in acute distress.    Appearance: She is obese. She is not diaphoretic.     Comments: Patient walks independently  HENT:     Head: Normocephalic and atraumatic.     Nose: Nose normal.     Mouth/Throat:     Pharynx: No oropharyngeal exudate.  Eyes:     General: No scleral icterus.    Pupils: Pupils are equal, round, and reactive to light.  Cardiovascular:     Rate and Rhythm: Normal rate and regular rhythm.     Heart sounds: No murmur heard.   Pulmonary:     Effort: Pulmonary effort is normal. No respiratory distress.     Breath sounds: No rales.  Chest:     Chest wall: No tenderness.  Abdominal:     General: There is no distension.     Palpations: Abdomen is soft.     Tenderness: There  is no abdominal tenderness.  Musculoskeletal:        General: Normal range of motion.     Cervical back: Normal range of motion and neck supple.  Skin:    General: Skin is warm and dry.     Findings: No erythema.     Comments: Right side anterior chest wall + Mediport  Neurological:     Mental Status: She is alert and oriented to person, place, and time.     Cranial Nerves: No cranial nerve deficit.     Motor: No abnormal muscle tone.     Coordination: Coordination normal.  Psychiatric:        Mood and Affect: Affect normal.       CMP Latest Ref  Rng & Units 12/10/2020  Glucose 70 - 99 mg/dL 135(H)  BUN 6 - 20 mg/dL 9  Creatinine 0.44 - 1.00 mg/dL 0.45  Sodium 135 - 145 mmol/L 138  Potassium 3.5 - 5.1 mmol/L 3.5  Chloride 98 - 111 mmol/L 104  CO2 22 - 32 mmol/L 25  Calcium 8.9 - 10.3 mg/dL 9.1  Total Protein 6.5 - 8.1 g/dL 7.3  Total Bilirubin 0.3 - 1.2 mg/dL 0.8  Alkaline Phos 38 - 126 U/L 105  AST 15 - 41 U/L 39  ALT 0 - 44 U/L 23   CBC Latest Ref Rng & Units 12/10/2020  WBC 4.0 - 10.5 K/uL 3.1(L)  Hemoglobin 12.0 - 15.0 g/dL 12.0  Hematocrit 36.0 - 46.0 % 35.7(L)  Platelets 150 - 400 K/uL 94(L)    RADIOGRAPHIC STUDIES: I have personally reviewed the radiological images as listed and agreed with the findings in the report. No results found.  Assessment and plan-  1. Malignant neoplasm of sigmoid colon ([place])   2. Encounter for antineoplastic chemotherapy   3. Chemotherapy-induced thrombocytopenia   4. Anemia due to antineoplastic chemotherapy   5. Obesity (BMI 35.0-39.9 without comorbidity)   Cancer Staging Malignant neoplasm of sigmoid colon ([place]) Staging form: Colon and Rectum, AJCC 8th Edition - Pathologic: Stage IIIB (pT3, pN1c, cM0) - Signed by Earlie Server, MD on 02/15/2020  #Stage IIIb colon cancer- 01/30/2020 pT3 pN1c, MMR intact  On adjuvant FOLFOX, bolus 5-FU omitted starting cycle 2.   Labs are reviewed and discussed with patient .  Counts acceptable to  proceed with FOLFOX treatment today.  Omit bolus 5-FU  #Constipation, continue current bowel regimen bowel regimen with Colace 100 mg twice daily.  Add MiraLAX daily as needed. #Chemotherapy-induced thrombocytopenia, platelet count has dropped to 94,000.  No bleeding events.  Monitor.Marland Kitchen   #Morbid obesity, recommend exercise as tolerated.  Healthy diet and lifestyle modification Spanish interpretor was present for the entire encounter.  Supportive care measures are necessary for patient well-being and will be provided as necessary. We spent sufficient time to discuss many aspect of care, questions were answered to patient's satisfaction.   Follow-up in 2 week for evaluation prior to next cycle treatments Earlie Server, MD, PhD Hematology Oncology [facility] Pager- [phone] 12/10/2020

## 2020-12-12 ENCOUNTER — Inpatient Hospital Stay: Payer: Self-pay

## 2020-12-12 ENCOUNTER — Other Ambulatory Visit: Payer: Self-pay

## 2020-12-12 VITALS — BP 116/81 | HR 71 | Temp 97.9°F | Resp 16

## 2020-12-12 DIAGNOSIS — C187 Malignant neoplasm of sigmoid colon: Secondary | ICD-10-CM

## 2020-12-12 MED ORDER — HEPARIN SOD (PORK) LOCK FLUSH 100 UNIT/ML IV SOLN
500.0000 [IU] | Freq: Once | INTRAVENOUS | Status: AC | PRN
  Administered 2020-12-12: 500 [IU]
  Filled 2020-12-12: qty 5

## 2020-12-12 MED ORDER — HEPARIN SOD (PORK) LOCK FLUSH 100 UNIT/ML IV SOLN
INTRAVENOUS | Status: AC
  Filled 2020-12-12: qty 5

## 2020-12-24 ENCOUNTER — Inpatient Hospital Stay: Payer: Self-pay

## 2020-12-24 ENCOUNTER — Other Ambulatory Visit: Payer: Self-pay

## 2020-12-24 ENCOUNTER — Inpatient Hospital Stay (HOSPITAL_BASED_OUTPATIENT_CLINIC_OR_DEPARTMENT_OTHER): Payer: Self-pay | Admitting: Oncology

## 2020-12-24 ENCOUNTER — Encounter: Payer: Self-pay | Admitting: Oncology

## 2020-12-24 VITALS — BP 133/88 | HR 76 | Temp 97.0°F | Resp 20 | Wt 196.3 lb

## 2020-12-24 DIAGNOSIS — D6959 Other secondary thrombocytopenia: Secondary | ICD-10-CM

## 2020-12-24 DIAGNOSIS — D6481 Anemia due to antineoplastic chemotherapy: Secondary | ICD-10-CM

## 2020-12-24 DIAGNOSIS — C187 Malignant neoplasm of sigmoid colon: Secondary | ICD-10-CM

## 2020-12-24 DIAGNOSIS — T451X5A Adverse effect of antineoplastic and immunosuppressive drugs, initial encounter: Secondary | ICD-10-CM

## 2020-12-24 DIAGNOSIS — Z5111 Encounter for antineoplastic chemotherapy: Secondary | ICD-10-CM

## 2020-12-24 LAB — CBC WITH DIFFERENTIAL/PLATELET
Abs Immature Granulocytes: 0.01 10*3/uL (ref 0.00–0.07)
Basophils Absolute: 0 10*3/uL (ref 0.0–0.1)
Basophils Relative: 0 %
Eosinophils Absolute: 0.1 10*3/uL (ref 0.0–0.5)
Eosinophils Relative: 2 %
HCT: 34.3 % — ABNORMAL LOW (ref 36.0–46.0)
Hemoglobin: 11.8 g/dL — ABNORMAL LOW (ref 12.0–15.0)
Immature Granulocytes: 0 %
Lymphocytes Relative: 36 %
Lymphs Abs: 1.1 10*3/uL (ref 0.7–4.0)
MCH: 31 pg (ref 26.0–34.0)
MCHC: 34.4 g/dL (ref 30.0–36.0)
MCV: 90 fL (ref 80.0–100.0)
Monocytes Absolute: 0.5 10*3/uL (ref 0.1–1.0)
Monocytes Relative: 16 %
Neutro Abs: 1.4 10*3/uL — ABNORMAL LOW (ref 1.7–7.7)
Neutrophils Relative %: 46 %
Platelets: 87 10*3/uL — ABNORMAL LOW (ref 150–400)
RBC: 3.81 MIL/uL — ABNORMAL LOW (ref 3.87–5.11)
RDW: 15.9 % — ABNORMAL HIGH (ref 11.5–15.5)
WBC: 3 10*3/uL — ABNORMAL LOW (ref 4.0–10.5)
nRBC: 0 % (ref 0.0–0.2)

## 2020-12-24 LAB — COMPREHENSIVE METABOLIC PANEL
ALT: 21 U/L (ref 0–44)
AST: 39 U/L (ref 15–41)
Albumin: 3.7 g/dL (ref 3.5–5.0)
Alkaline Phosphatase: 101 U/L (ref 38–126)
Anion gap: 9 (ref 5–15)
BUN: 8 mg/dL (ref 6–20)
CO2: 24 mmol/L (ref 22–32)
Calcium: 9.2 mg/dL (ref 8.9–10.3)
Chloride: 105 mmol/L (ref 98–111)
Creatinine, Ser: 0.46 mg/dL (ref 0.44–1.00)
GFR, Estimated: >60 mL/min (ref >60)
Glucose, Bld: 131 mg/dL — ABNORMAL HIGH (ref 70–99)
Potassium: 3.4 mmol/L — ABNORMAL LOW (ref 3.5–5.1)
Sodium: 138 mmol/L (ref 135–145)
Total Bilirubin: 0.7 mg/dL (ref 0.3–1.2)
Total Protein: 7.1 g/dL (ref 6.5–8.1)

## 2020-12-24 MED ORDER — OXALIPLATIN CHEMO INJECTION 100 MG/20ML
150.0000 mg | Freq: Once | INTRAVENOUS | Status: AC
  Administered 2020-12-24: 150 mg via INTRAVENOUS
  Filled 2020-12-24: qty 20

## 2020-12-24 MED ORDER — POTASSIUM CHLORIDE ER 10 MEQ PO TBCR: 10.0000 meq | EXTENDED_RELEASE_TABLET | Freq: Every day | ORAL | 1 refills | Status: DC

## 2020-12-24 MED ORDER — SODIUM CHLORIDE 0.9% FLUSH
10.0000 mL | Freq: Once | INTRAVENOUS | Status: AC
  Administered 2020-12-24: 10 mL via INTRAVENOUS
  Filled 2020-12-24: qty 10

## 2020-12-24 MED ORDER — LEUCOVORIN CALCIUM INJECTION 350 MG
750.0000 mg | Freq: Once | INTRAVENOUS | Status: AC
  Administered 2020-12-24: 750 mg via INTRAVENOUS
  Filled 2020-12-24: qty 37.5

## 2020-12-24 MED ORDER — DEXTROSE 5 % IV SOLN
Freq: Once | INTRAVENOUS | Status: AC
  Filled 2020-12-24: qty 250

## 2020-12-24 MED ORDER — SODIUM CHLORIDE 0.9 % IV SOLN
2400.0000 mg/m2 | INTRAVENOUS | Status: DC
  Administered 2020-12-24: 4400 mg via INTRAVENOUS
  Filled 2020-12-24: qty 88

## 2020-12-24 MED ORDER — SODIUM CHLORIDE 0.9 % IV SOLN
10.0000 mg | Freq: Once | INTRAVENOUS | Status: AC
  Administered 2020-12-24: 10 mg via INTRAVENOUS
  Filled 2020-12-24: qty 10

## 2020-12-24 MED ORDER — PALONOSETRON HCL INJECTION 0.25 MG/5ML
0.2500 mg | Freq: Once | INTRAVENOUS | Status: AC
  Administered 2020-12-24: 0.25 mg via INTRAVENOUS
  Filled 2020-12-24: qty 5

## 2020-12-24 NOTE — Progress Notes (Signed)
.   Hematology/Oncology Progress Note [facility] [phone] [phone]  Patient Care Team: [name], MD as PCP - General (Family Medicine) [name], RN as Registered Nurse [name], RN as Registered Nurse   Name of the patient: Debra [name]  [id number]  Jan 29, 1966  Date of visit: 12/24/20  PERTINENT ONCOLOGY HISTORY Debra [name] is a 55 y.o.afemale who has above oncology history reviewed by me today presented for follow up visit for management of Stage IIIB Colon cancer seen by me on 11/21/2019 during her hospitalization. Patient has a history of Covid positivity. Severe iron deficiency status post IV Venofer treatment. Colonoscopy 11/21/2019 showed a malignant appearing severe stenosis found in the sigmoid colon at 25 cm proximal to the anus and was nontransversed.  Biopsy was taken.  Surgical pathology is pending  Nonbleeding external hemorrhoids Biopsy was not diagnostic 01/03/2020 virtual colonoscopy showed long segment area of wall thickening and annular constriction within the sigmoid colon in the area of previous concern most compatible with annular malignancy.  Remainder of the colon evaluation is somewhat limited due to large amount of retained stool and incomplete distention proximal to the annular constricting lesion. 01/30/2020 Patient underwent left hemicolectomy by Dr. Celine Ahr and a biopsy showed invasive adenocarcinoma, poorly differentiated.  37 lymph nodes negative for malignancy.  Grade 3, all margins are uninvolved.  Perineural invasion present.  Tumor deposit present. pT3 pN1c cM0 Checks x-ray did not show any lung mass. #Postop, patient developed postop ileus and she required TPN, PICC and NG tube placement.  Postoperation day 9 she continued to have increased leukocytosis and was subsequently found to have a midline wound infection to the inferior portion of the wound.  Patient was treated with  antibiotics.  07/03/2020 CT abdomen pelvis w contrast showed well positioned drainage catheter with apparent resolution of the intra-abdominal abscess. 07/10/2020 seen by [facility] and patient had drainage catheter removed by IR on 07/12/2020   INTERVAL HISTORY Debra [name] is a 55 y.o. female who has above history reviewed by me today presents for follow up visit for management of stage IIIb colon cancer Problems and complaints are listed below: Patient is on adjuvant FOLFOX.  Overall she tolerates well.  She reports feeling tired She has grade 1 neuropathy symptoms, symptoms are stable. Appetite is fair. Weight is stable No nausea vomiting diarrhea Denies any bleeding events.  Review of systems- Review of Systems  Constitutional: Positive for fatigue. Negative for appetite change, chills, fever and unexpected weight change.  HENT:   Negative for hearing loss and voice change.   Eyes: Negative for eye problems.  Respiratory: Negative for chest tightness, cough and shortness of breath.   Cardiovascular: Negative for chest pain.  Gastrointestinal: Negative for abdominal distention, abdominal pain, blood in stool and constipation.  Endocrine: Negative for hot flashes.  Genitourinary: Negative for difficulty urinating, dysuria and frequency.   Musculoskeletal: Negative for arthralgias.  Skin: Negative for itching and wound.  Neurological: Positive for numbness. Negative for extremity weakness, headaches and light-headedness.  Hematological: Negative for adenopathy.  Psychiatric/Behavioral: Negative for confusion.    No Known Allergies  Patient Active Problem List   Diagnosis Date Noted  . Encounter for antineoplastic chemotherapy 09/03/2020  . Encounter for central line placement   . Loss of weight 02/15/2020  . Goals of care, counseling/discussion 02/15/2020  . Malignant neoplasm of sigmoid colon ([place]) 02/15/2020  . Pressure injury of skin 02/03/2020  . Bowel obstruction  ([place])  01/29/2020  . Iron deficiency anemia due to chronic blood loss 11/22/2019  . COVID-19 virus detected 11/20/2019  . Symptomatic anemia 11/20/2019  . Hypothyroidism 11/20/2019  . Iron deficiency anemia 11/20/2019  . GIB (gastrointestinal bleeding) 11/20/2019     Past Medical History:  Diagnosis Date  . Anemia   . Colon cancer ([place]) 04/2020  . Hypothyroidism   . Thyroid disease      Past Surgical History:  Procedure Laterality Date  . COLONOSCOPY WITH PROPOFOL N/A 11/21/2019   Procedure: COLONOSCOPY WITH PROPOFOL;  Surgeon: Lin Landsman, MD;  Location: [facility] ENDOSCOPY;  Service: Gastroenterology;  Laterality: N/A;  . CYSTOSCOPY WITH STENT PLACEMENT Left 01/30/2020   Procedure: CYSTOSCOPY WITH STENT PLACEMENT;  Surgeon: Hollice Espy, MD;  Location: [facility] ORS;  Service: Urology;  Laterality: Left;  . ESOPHAGOGASTRODUODENOSCOPY (EGD) WITH PROPOFOL N/A 11/21/2019   Procedure: ESOPHAGOGASTRODUODENOSCOPY (EGD) WITH PROPOFOL;  Surgeon: Lin Landsman, MD;  Location: [facility] ENDOSCOPY;  Service: Gastroenterology;  Laterality: N/A;  . IR RADIOLOGIST EVAL & MGMT  07/03/2020  . IR RADIOLOGIST EVAL & MGMT  07/12/2020  . PARTIAL COLECTOMY N/A 01/30/2020   Procedure: PARTIAL COLECTOMY Sigmoid;  Surgeon: [name], MD;  Location: [facility] ORS;  Service: General;  Laterality: N/A;  . PORTACATH PLACEMENT Left 03/28/2020   Procedure: INSERTION PORT-A-CATH;  Surgeon: [name], MD;  Location: [facility] ORS;  Service: General;  Laterality: Left;  . PORTACATH PLACEMENT N/A 08/29/2020   Procedure: INSERTION PORT-A-CATH, chemotherapy;  Surgeon: [name], MD;  Location: [facility] ORS;  Service: General;  Laterality: N/A;    Social History   Socioeconomic History  . Marital status: Single    Spouse name: Not on file  . Number of children: Not on file  . Years of education: Not on file  . Highest education level: Not on file  Occupational History  . Occupation: Factory  Tobacco  Use  . Smoking status: Never Smoker  . Smokeless tobacco: Never Used  Vaping Use  . Vaping Use: Never used  Substance and Sexual Activity  . Alcohol use: Never  . Drug use: Never  . Sexual activity: Not on file  Other Topics Concern  . Not on file  Social History Narrative   Lives at home with significant other   Social Determinants of Health   Financial Resource Strain: Not on file  Food Insecurity: Not on file  Transportation Needs: Not on file  Physical Activity: Not on file  Stress: Not on file  Social Connections: Not on file  Intimate Partner Violence: Not on file     Family History  Problem Relation Age of Onset  . Diabetes Sister      Current Outpatient Medications:  .  B-D 3CC LUER-LOK SYR 25GX1" 25G X 1" 3 ML MISC, Use with B12 injection, Disp: , Rfl:  .  cyanocobalamin (,VITAMIN B-12,) 1000 MCG/ML injection, Inject 1,000 mcg into the muscle every 30 (thirty) days., Disp: , Rfl:  .  docusate sodium (COLACE) 100 MG capsule, Take 1 capsule (100 mg total) by mouth daily., Disp: 30 capsule, Rfl: 1 .  ibuprofen (ADVIL) 800 MG tablet, Take 1 tablet (800 mg total) by mouth every 8 (eight) hours as needed., Disp: 30 tablet, Rfl: 0 .  levothyroxine (SYNTHROID) 137 MCG tablet, Take 137 mcg by mouth daily before breakfast. , Disp: , Rfl:  .  lidocaine-prilocaine (EMLA) cream, Apply to affected area once, Disp: 30 g, Rfl: 3 .  magic mouthwash w/lidocaine SOLN, Take 5 mLs by mouth  4 (four) times daily as needed for mouth pain. Sig: Swish/Spit 5-10 ml four times a day as needed. Dispense 480 ml. 1RF, Disp: 480 mL, Rfl: 1 .  nystatin (MYCOSTATIN/NYSTOP) powder, Apply 1 application topically 3 (three) times daily., Disp: 30 g, Rfl: 0 .  ondansetron (ZOFRAN) 8 MG tablet, Take 1 tablet (8 mg total) by mouth 2 (two) times daily as needed for refractory nausea / vomiting. Start on day 3 after chemotherapy., Disp: 30 tablet, Rfl: 1 .  prochlorperazine (COMPAZINE) 10 MG tablet, Take 1  tablet (10 mg total) by mouth every 6 (six) hours as needed (Nausea or vomiting)., Disp: 30 tablet, Rfl: 1 .  hydrocortisone cream 0.5 %, Apply 1 application topically 2 (two) times daily. Until rash is healed. (Patient not taking: No sig reported), Disp: 30 g, Rfl: 0 .  polyethylene glycol (MIRALAX) 17 g packet, Take 17 g by mouth daily as needed. (Patient not taking: No sig reported), Disp: 14 each, Rfl: 0   Physical exam:  Vitals:   12/24/20 0832  Resp: 20  Temp: (!) 97 F (36.1 C)  Weight: 196 lb 4.8 oz (89 kg)   Physical Exam Constitutional:      General: She is not in acute distress.    Appearance: She is obese. She is not diaphoretic.     Comments: Patient walks independently  HENT:     Head: Normocephalic and atraumatic.     Nose: Nose normal.     Mouth/Throat:     Pharynx: No oropharyngeal exudate.  Eyes:     General: No scleral icterus.    Pupils: Pupils are equal, round, and reactive to light.  Cardiovascular:     Rate and Rhythm: Normal rate and regular rhythm.     Heart sounds: No murmur heard.   Pulmonary:     Effort: Pulmonary effort is normal. No respiratory distress.     Breath sounds: No rales.  Chest:     Chest wall: No tenderness.  Abdominal:     General: There is no distension.     Palpations: Abdomen is soft.     Tenderness: There is no abdominal tenderness.  Musculoskeletal:        General: Normal range of motion.     Cervical back: Normal range of motion and neck supple.  Skin:    General: Skin is warm and dry.     Findings: No erythema.     Comments: Right side anterior chest wall + Mediport  Neurological:     Mental Status: She is alert and oriented to person, place, and time.     Cranial Nerves: No cranial nerve deficit.     Motor: No abnormal muscle tone.     Coordination: Coordination normal.  Psychiatric:        Mood and Affect: Affect normal.       CMP Latest Ref Rng & Units 12/10/2020  Glucose 70 - 99 mg/dL 135(H)  BUN 6 - 20  mg/dL 9  Creatinine 0.44 - 1.00 mg/dL 0.45  Sodium 135 - 145 mmol/L 138  Potassium 3.5 - 5.1 mmol/L 3.5  Chloride 98 - 111 mmol/L 104  CO2 22 - 32 mmol/L 25  Calcium 8.9 - 10.3 mg/dL 9.1  Total Protein 6.5 - 8.1 g/dL 7.3  Total Bilirubin 0.3 - 1.2 mg/dL 0.8  Alkaline Phos 38 - 126 U/L 105  AST 15 - 41 U/L 39  ALT 0 - 44 U/L 23   CBC Latest Ref Rng &  Units 12/24/2020  WBC 4.0 - 10.5 K/uL 3.0(L)  Hemoglobin 12.0 - 15.0 g/dL 11.8(L)  Hematocrit 36.0 - 46.0 % 34.3(L)  Platelets 150 - 400 K/uL 87(L)    RADIOGRAPHIC STUDIES: I have personally reviewed the radiological images as listed and agreed with the findings in the report. No results found.  Assessment and plan-  1. Malignant neoplasm of sigmoid colon ([place])   2. Encounter for antineoplastic chemotherapy   3. Chemotherapy-induced thrombocytopenia   4. Anemia due to antineoplastic chemotherapy   Cancer Staging Malignant neoplasm of sigmoid colon [facility]) Staging form: Colon and Rectum, AJCC 8th Edition - Pathologic: Stage IIIB (pT3, pN1c, cM0) - Signed by Earlie Server, MD on 02/15/2020  #Stage IIIb colon cancer- 01/30/2020 pT3 pN1c, MMR intact  On adjuvant FOLFOX, bolus 5-FU omitted starting cycle 2.   Labs are reviewed and discussed with patient .  Counts acceptable to proceed with FOLFOX treatment today.  Omit bolus 5-FU  #Chemotherapy-induced thrombocytopenia.  Anticipate that counts will further decrease after today's treatment.  She will come back in 3 weeks instead of 2 weeks for the next cycle of treatment.  #Constipation, continue current regimen bowel regimen with Colace 100 mg twice daily.  Add MiraLAX daily as needed.  #Morbid obesity, recommend exercise as tolerated.  Healthy diet and lifestyle modification Spanish interpretor was present for the entire encounter.  Supportive care measures are necessary for patient well-being and will be provided as necessary. We spent sufficient time to discuss many aspect of care,  questions were answered to patient's satisfaction.   Follow-up in 3 week for evaluation prior to next cycle treatments Earlie Server, MD, PhD Hematology Oncology [facility] Pager- [phone] 12/24/2020

## 2020-12-24 NOTE — Progress Notes (Signed)
Patient was notified that rx for potassium 10 meq QD was sent to her preferred pharmacy.

## 2020-12-26 ENCOUNTER — Other Ambulatory Visit: Payer: Self-pay

## 2020-12-26 ENCOUNTER — Inpatient Hospital Stay: Payer: Self-pay

## 2020-12-26 DIAGNOSIS — C187 Malignant neoplasm of sigmoid colon: Secondary | ICD-10-CM

## 2020-12-26 MED ORDER — HEPARIN SOD (PORK) LOCK FLUSH 100 UNIT/ML IV SOLN
INTRAVENOUS | Status: AC
  Filled 2020-12-26: qty 5

## 2020-12-26 MED ORDER — HEPARIN SOD (PORK) LOCK FLUSH 100 UNIT/ML IV SOLN
500.0000 [IU] | Freq: Once | INTRAVENOUS | Status: AC | PRN
  Administered 2020-12-26: 500 [IU]
  Filled 2020-12-26: qty 5

## 2020-12-26 MED ORDER — SODIUM CHLORIDE 0.9% FLUSH
10.0000 mL | INTRAVENOUS | Status: DC | PRN
  Administered 2020-12-26: 10 mL
  Filled 2020-12-26: qty 10

## 2021-01-14 ENCOUNTER — Inpatient Hospital Stay: Payer: Self-pay | Attending: Oncology

## 2021-01-14 ENCOUNTER — Encounter: Payer: Self-pay | Admitting: Oncology

## 2021-01-14 ENCOUNTER — Inpatient Hospital Stay: Payer: Self-pay

## 2021-01-14 ENCOUNTER — Other Ambulatory Visit: Payer: Self-pay

## 2021-01-14 ENCOUNTER — Inpatient Hospital Stay (HOSPITAL_BASED_OUTPATIENT_CLINIC_OR_DEPARTMENT_OTHER): Payer: Self-pay | Admitting: Oncology

## 2021-01-14 VITALS — BP 118/77 | HR 71 | Temp 98.5°F | Resp 18 | Wt 195.2 lb

## 2021-01-14 DIAGNOSIS — D6481 Anemia due to antineoplastic chemotherapy: Secondary | ICD-10-CM | POA: Insufficient documentation

## 2021-01-14 DIAGNOSIS — D6959 Other secondary thrombocytopenia: Secondary | ICD-10-CM

## 2021-01-14 DIAGNOSIS — E039 Hypothyroidism, unspecified: Secondary | ICD-10-CM | POA: Insufficient documentation

## 2021-01-14 DIAGNOSIS — G620 Drug-induced polyneuropathy: Secondary | ICD-10-CM | POA: Insufficient documentation

## 2021-01-14 DIAGNOSIS — T451X5A Adverse effect of antineoplastic and immunosuppressive drugs, initial encounter: Secondary | ICD-10-CM | POA: Insufficient documentation

## 2021-01-14 DIAGNOSIS — C187 Malignant neoplasm of sigmoid colon: Secondary | ICD-10-CM | POA: Insufficient documentation

## 2021-01-14 DIAGNOSIS — Z5111 Encounter for antineoplastic chemotherapy: Secondary | ICD-10-CM

## 2021-01-14 DIAGNOSIS — Z79899 Other long term (current) drug therapy: Secondary | ICD-10-CM | POA: Insufficient documentation

## 2021-01-14 DIAGNOSIS — E669 Obesity, unspecified: Secondary | ICD-10-CM

## 2021-01-14 DIAGNOSIS — K5900 Constipation, unspecified: Secondary | ICD-10-CM | POA: Insufficient documentation

## 2021-01-14 DIAGNOSIS — D500 Iron deficiency anemia secondary to blood loss (chronic): Secondary | ICD-10-CM | POA: Insufficient documentation

## 2021-01-14 LAB — COMPREHENSIVE METABOLIC PANEL
ALT: 19 U/L (ref 0–44)
AST: 40 U/L (ref 15–41)
Albumin: 3.7 g/dL (ref 3.5–5.0)
Alkaline Phosphatase: 127 U/L — ABNORMAL HIGH (ref 38–126)
Anion gap: 7 (ref 5–15)
BUN: 12 mg/dL (ref 6–20)
CO2: 24 mmol/L (ref 22–32)
Calcium: 8.9 mg/dL (ref 8.9–10.3)
Chloride: 106 mmol/L (ref 98–111)
Creatinine, Ser: 0.49 mg/dL (ref 0.44–1.00)
GFR, Estimated: >60 mL/min (ref >60)
Glucose, Bld: 139 mg/dL — ABNORMAL HIGH (ref 70–99)
Potassium: 3.5 mmol/L (ref 3.5–5.1)
Sodium: 137 mmol/L (ref 135–145)
Total Bilirubin: 0.5 mg/dL (ref 0.3–1.2)
Total Protein: 6.9 g/dL (ref 6.5–8.1)

## 2021-01-14 LAB — CBC WITH DIFFERENTIAL/PLATELET
Abs Immature Granulocytes: 0.01 10*3/uL (ref 0.00–0.07)
Basophils Absolute: 0 10*3/uL (ref 0.0–0.1)
Basophils Relative: 1 %
Eosinophils Absolute: 0.1 10*3/uL (ref 0.0–0.5)
Eosinophils Relative: 2 %
HCT: 36 % (ref 36.0–46.0)
Hemoglobin: 11.9 g/dL — ABNORMAL LOW (ref 12.0–15.0)
Immature Granulocytes: 0 %
Lymphocytes Relative: 38 %
Lymphs Abs: 1.2 10*3/uL (ref 0.7–4.0)
MCH: 30 pg (ref 26.0–34.0)
MCHC: 33.1 g/dL (ref 30.0–36.0)
MCV: 90.7 fL (ref 80.0–100.0)
Monocytes Absolute: 0.6 10*3/uL (ref 0.1–1.0)
Monocytes Relative: 19 %
Neutro Abs: 1.2 10*3/uL — ABNORMAL LOW (ref 1.7–7.7)
Neutrophils Relative %: 40 %
Platelets: 89 10*3/uL — ABNORMAL LOW (ref 150–400)
RBC: 3.97 MIL/uL (ref 3.87–5.11)
RDW: 15.2 % (ref 11.5–15.5)
WBC: 3.1 10*3/uL — ABNORMAL LOW (ref 4.0–10.5)
nRBC: 0 % (ref 0.0–0.2)

## 2021-01-14 MED ORDER — SODIUM CHLORIDE 0.9 % IV SOLN
10.0000 mg | Freq: Once | INTRAVENOUS | Status: AC
  Administered 2021-01-14: 10 mg via INTRAVENOUS
  Filled 2021-01-14: qty 10

## 2021-01-14 MED ORDER — SODIUM CHLORIDE 0.9% FLUSH
10.0000 mL | Freq: Once | INTRAVENOUS | Status: AC
  Administered 2021-01-14: 10 mL via INTRAVENOUS
  Filled 2021-01-14: qty 10

## 2021-01-14 MED ORDER — OXALIPLATIN CHEMO INJECTION 100 MG/20ML
75.0000 mg/m2 | Freq: Once | INTRAVENOUS | Status: AC
  Administered 2021-01-14: 135 mg via INTRAVENOUS
  Filled 2021-01-14: qty 20

## 2021-01-14 MED ORDER — PALONOSETRON HCL INJECTION 0.25 MG/5ML
0.2500 mg | Freq: Once | INTRAVENOUS | Status: AC
  Administered 2021-01-14: 0.25 mg via INTRAVENOUS
  Filled 2021-01-14: qty 5

## 2021-01-14 MED ORDER — DEXTROSE 5 % IV SOLN
Freq: Once | INTRAVENOUS | Status: AC
Start: 2021-01-14 — End: 2021-01-14
  Filled 2021-01-14: qty 250

## 2021-01-14 MED ORDER — LEUCOVORIN CALCIUM INJECTION 350 MG
410.0000 mg/m2 | Freq: Once | INTRAVENOUS | Status: AC
  Administered 2021-01-14: 750 mg via INTRAVENOUS
  Filled 2021-01-14: qty 37.5

## 2021-01-14 MED ORDER — SODIUM CHLORIDE 0.9 % IV SOLN
2400.0000 mg/m2 | INTRAVENOUS | Status: DC
  Administered 2021-01-14: 4400 mg via INTRAVENOUS
  Filled 2021-01-14: qty 88

## 2021-01-14 NOTE — Progress Notes (Signed)
.   Hematology/Oncology Progress Note [facility] Telephone:([phone] Fax:([phone]  Patient Care Team: [name], MD as PCP - General (Family Medicine) [name], RN as Registered Nurse [name], RN as Registered Nurse   Name of the patient: Debra [name]  [id number]  [birth year]  Date of visit: 01/14/21  PERTINENT ONCOLOGY HISTORY Debra [name] is a 55 y.o.afemale who has above oncology history reviewed by me today presented for follow up visit for management of Stage IIIB Colon cancer seen by me on 11/21/2019 during her hospitalization. Patient has a history of Covid positivity. Severe iron deficiency status post IV Venofer treatment. Colonoscopy 11/21/2019 showed a malignant appearing severe stenosis found in the sigmoid colon at 25 cm proximal to the anus and was nontransversed.  Biopsy was taken.  Surgical pathology is pending  Nonbleeding external hemorrhoids Biopsy was not diagnostic 01/03/2020 virtual colonoscopy showed long segment area of wall thickening and annular constriction within the sigmoid colon in the area of previous concern most compatible with annular malignancy.  Remainder of the colon evaluation is somewhat limited due to large amount of retained stool and incomplete distention proximal to the annular constricting lesion. 01/30/2020 Patient underwent left hemicolectomy by Dr. Celine Ahr and a biopsy showed invasive adenocarcinoma, poorly differentiated.  37 lymph nodes negative for malignancy.  Grade 3, all margins are uninvolved.  Perineural invasion present.  Tumor deposit present. pT3 pN1c cM0 Checks x-ray did not show any lung mass. #Postop, patient developed postop ileus and she required TPN, PICC and NG tube placement.  Postoperation day 9 she continued to have increased leukocytosis and was subsequently found to have a midline wound infection to the inferior portion of the wound.  Patient was treated with  antibiotics.  07/03/2020 CT abdomen pelvis w contrast showed well positioned drainage catheter with apparent resolution of the intra-abdominal abscess. 07/10/2020 seen by [facility] and patient had drainage catheter removed by IR on 07/12/2020   INTERVAL HISTORY Debra [name] is a 55 y.o. female who has above history reviewed by me today presents for follow up visit for management of stage IIIb colon cancer Problems and complaints are listed below: Patient is on adjuvant FOLFOX.   She has grade 1 neuropathy symptoms, toes and finger tips, symptoms are stable and not worse.  Appetite is fair, stable weight No nausea vomiting diarrhea She saw very small amount of blood x 1 after she passed some hard stool.  Review of systems- Review of Systems  Constitutional: Positive for fatigue. Negative for appetite change, chills, fever and unexpected weight change.  HENT:   Negative for hearing loss and voice change.   Eyes: Negative for eye problems.  Respiratory: Negative for chest tightness, cough and shortness of breath.   Cardiovascular: Negative for chest pain.  Gastrointestinal: Negative for abdominal distention, abdominal pain, blood in stool and constipation.  Endocrine: Negative for hot flashes.  Genitourinary: Negative for difficulty urinating, dysuria and frequency.   Musculoskeletal: Negative for arthralgias.  Skin: Negative for itching and wound.  Neurological: Positive for numbness. Negative for extremity weakness, headaches and light-headedness.  Hematological: Negative for adenopathy.  Psychiatric/Behavioral: Negative for confusion.    No Known Allergies  Patient Active Problem List   Diagnosis Date Noted  . Encounter for antineoplastic chemotherapy 09/03/2020  . Encounter for central line placement   . Loss of weight 02/15/2020  . Goals of care, counseling/discussion 02/15/2020  . Malignant neoplasm of sigmoid colon ([place]) 02/15/2020  . Pressure  injury of skin  02/03/2020  . Bowel obstruction ([place]) 01/29/2020  . Iron deficiency anemia due to chronic blood loss 11/22/2019  . COVID-19 virus detected 11/20/2019  . Symptomatic anemia 11/20/2019  . Hypothyroidism 11/20/2019  . Iron deficiency anemia 11/20/2019  . GIB (gastrointestinal bleeding) 11/20/2019     Past Medical History:  Diagnosis Date  . Anemia   . Colon cancer ([place]) 04/2020  . Hypothyroidism   . Thyroid disease      Past Surgical History:  Procedure Laterality Date  . COLONOSCOPY WITH PROPOFOL N/A 11/21/2019   Procedure: COLONOSCOPY WITH PROPOFOL;  Surgeon: Lin Landsman, MD;  Location: [facility] ENDOSCOPY;  Service: Gastroenterology;  Laterality: N/A;  . CYSTOSCOPY WITH STENT PLACEMENT Left 01/30/2020   Procedure: CYSTOSCOPY WITH STENT PLACEMENT;  Surgeon: Hollice Espy, MD;  Location: [facility] ORS;  Service: Urology;  Laterality: Left;  . ESOPHAGOGASTRODUODENOSCOPY (EGD) WITH PROPOFOL N/A 11/21/2019   Procedure: ESOPHAGOGASTRODUODENOSCOPY (EGD) WITH PROPOFOL;  Surgeon: Lin Landsman, MD;  Location: [facility] ENDOSCOPY;  Service: Gastroenterology;  Laterality: N/A;  . IR RADIOLOGIST EVAL & MGMT  07/03/2020  . IR RADIOLOGIST EVAL & MGMT  07/12/2020  . PARTIAL COLECTOMY N/A 01/30/2020   Procedure: PARTIAL COLECTOMY Sigmoid;  Surgeon: [name], MD;  Location: [facility] ORS;  Service: General;  Laterality: N/A;  . PORTACATH PLACEMENT Left 03/28/2020   Procedure: INSERTION PORT-A-CATH;  Surgeon: [name], MD;  Location: [facility] ORS;  Service: General;  Laterality: Left;  . PORTACATH PLACEMENT N/A 08/29/2020   Procedure: INSERTION PORT-A-CATH, chemotherapy;  Surgeon: [name], MD;  Location: [facility] ORS;  Service: General;  Laterality: N/A;    Social History   Socioeconomic History  . Marital status: Single    Spouse name: Not on file  . Number of children: Not on file  . Years of education: Not on file  . Highest education level: Not on file  Occupational History  .  Occupation: Factory  Tobacco Use  . Smoking status: Never Smoker  . Smokeless tobacco: Never Used  Vaping Use  . Vaping Use: Never used  Substance and Sexual Activity  . Alcohol use: Never  . Drug use: Never  . Sexual activity: Not on file  Other Topics Concern  . Not on file  Social History Narrative   Lives at home with significant other   Social Determinants of Health   Financial Resource Strain: Not on file  Food Insecurity: Not on file  Transportation Needs: Not on file  Physical Activity: Not on file  Stress: Not on file  Social Connections: Not on file  Intimate Partner Violence: Not on file     Family History  Problem Relation Age of Onset  . Diabetes Sister      Current Outpatient Medications:  .  B-D 3CC LUER-LOK SYR 25GX1" 25G X 1" 3 ML MISC, Use with B12 injection, Disp: , Rfl:  .  cyanocobalamin (,VITAMIN B-12,) 1000 MCG/ML injection, Inject 1,000 mcg into the muscle every 30 (thirty) days., Disp: , Rfl:  .  docusate sodium (COLACE) 100 MG capsule, Take 1 capsule (100 mg total) by mouth daily., Disp: 30 capsule, Rfl: 1 .  ibuprofen (ADVIL) 800 MG tablet, Take 1 tablet (800 mg total) by mouth every 8 (eight) hours as needed., Disp: 30 tablet, Rfl: 0 .  levothyroxine (SYNTHROID) 137 MCG tablet, Take 137 mcg by mouth daily before breakfast. , Disp: , Rfl:  .  lidocaine-prilocaine (EMLA) cream, Apply to affected area once, Disp: 30 g, Rfl: 3 .  magic mouthwash w/lidocaine SOLN, Take 5 mLs by mouth 4 (four) times daily as needed for mouth pain. Sig: Swish/Spit 5-10 ml four times a day as needed. Dispense 480 ml. 1RF, Disp: 480 mL, Rfl: 1 .  nystatin (MYCOSTATIN/NYSTOP) powder, Apply 1 application topically 3 (three) times daily., Disp: 30 g, Rfl: 0 .  ondansetron (ZOFRAN) 8 MG tablet, Take 1 tablet (8 mg total) by mouth 2 (two) times daily as needed for refractory nausea / vomiting. Start on day 3 after chemotherapy., Disp: 30 tablet, Rfl: 1 .  prochlorperazine  (COMPAZINE) 10 MG tablet, Take 1 tablet (10 mg total) by mouth every 6 (six) hours as needed (Nausea or vomiting)., Disp: 30 tablet, Rfl: 1 .  hydrocortisone cream 0.5 %, Apply 1 application topically 2 (two) times daily. Until rash is healed. (Patient not taking: Reported on 01/14/2021), Disp: 30 g, Rfl: 0 .  polyethylene glycol (MIRALAX) 17 g packet, Take 17 g by mouth daily as needed. (Patient not taking: No sig reported), Disp: 14 each, Rfl: 0 .  potassium chloride (KLOR-CON) 10 MEQ tablet, Take 1 tablet (10 mEq total) by mouth daily. (Patient not taking: Reported on 01/14/2021), Disp: 30 tablet, Rfl: 1 No current facility-administered medications for this visit.  Facility-Administered Medications Ordered in Other Visits:  .  dexamethasone (DECADRON) 10 mg in sodium chloride 0.9 % 50 mL IVPB, 10 mg, Intravenous, Once, Earlie Server, MD .  fluorouracil (ADRUCIL) 4,400 mg in sodium chloride 0.9 % 62 mL chemo infusion, 2,400 mg/m2 (Treatment Plan Recorded), Intravenous, 1 day or 1 dose, Earlie Server, MD .  leucovorin 750 mg in dextrose 5 % 250 mL infusion, 410 mg/m2 (Treatment Plan Recorded), Intravenous, Once, Earlie Server, MD .  oxaliplatin (ELOXATIN) 135 mg in dextrose 5 % 500 mL chemo infusion, 75 mg/m2 (Treatment Plan Recorded), Intravenous, Once, Earlie Server, MD .  palonosetron (ALOXI) injection 0.25 mg, 0.25 mg, Intravenous, Once, Earlie Server, MD   Physical exam:  Vitals:   01/14/21 0852  BP: 118/77  Pulse: 71  Resp: 18  Temp: 98.5 F (36.9 C)  Weight: 195 lb 3.2 oz (88.5 kg)   Physical Exam Constitutional:      General: She is not in acute distress.    Appearance: She is obese. She is not diaphoretic.     Comments: Patient walks independently  HENT:     Head: Normocephalic and atraumatic.     Nose: Nose normal.     Mouth/Throat:     Pharynx: No oropharyngeal exudate.  Eyes:     General: No scleral icterus.    Pupils: Pupils are equal, round, and reactive to light.  Cardiovascular:     Rate  and Rhythm: Normal rate and regular rhythm.     Heart sounds: No murmur heard.   Pulmonary:     Effort: Pulmonary effort is normal. No respiratory distress.     Breath sounds: No rales.  Chest:     Chest wall: No tenderness.  Abdominal:     General: There is no distension.     Palpations: Abdomen is soft.     Tenderness: There is no abdominal tenderness.  Musculoskeletal:        General: Normal range of motion.     Cervical back: Normal range of motion and neck supple.  Skin:    General: Skin is warm and dry.     Findings: No erythema.     Comments: Right side anterior chest wall + Mediport  Neurological:  Mental Status: She is alert and oriented to person, place, and time.     Cranial Nerves: No cranial nerve deficit.     Motor: No abnormal muscle tone.     Coordination: Coordination normal.  Psychiatric:        Mood and Affect: Affect normal.       CMP Latest Ref Rng & Units 01/14/2021  Glucose 70 - 99 mg/dL 139(H)  BUN 6 - 20 mg/dL 12  Creatinine 0.44 - 1.00 mg/dL 0.49  Sodium 135 - 145 mmol/L 137  Potassium 3.5 - 5.1 mmol/L 3.5  Chloride 98 - 111 mmol/L 106  CO2 22 - 32 mmol/L 24  Calcium 8.9 - 10.3 mg/dL 8.9  Total Protein 6.5 - 8.1 g/dL 6.9  Total Bilirubin 0.3 - 1.2 mg/dL 0.5  Alkaline Phos 38 - 126 U/L 127(H)  AST 15 - 41 U/L 40  ALT 0 - 44 U/L 19   CBC Latest Ref Rng & Units 01/14/2021  WBC 4.0 - 10.5 K/uL 3.1(L)  Hemoglobin 12.0 - 15.0 g/dL 11.9(L)  Hematocrit 36.0 - 46.0 % 36.0  Platelets 150 - 400 K/uL 89(L)    RADIOGRAPHIC STUDIES: I have personally reviewed the radiological images as listed and agreed with the findings in the report. No results found.  Assessment and plan-  1. Malignant neoplasm of sigmoid colon ([place])   2. Encounter for antineoplastic chemotherapy   3. Chemotherapy-induced thrombocytopenia   4. Anemia due to antineoplastic chemotherapy   5. Obesity (BMI 35.0-39.9 without comorbidity)   Cancer Staging Malignant neoplasm of  sigmoid colon ([place]) Staging form: Colon and Rectum, AJCC 8th Edition - Pathologic: Stage IIIB (pT3, pN1c, cM0) - Signed by Earlie Server, MD on 02/15/2020  #Stage IIIb colon cancer- 01/30/2020 pT3 pN1c, MMR intact  On adjuvant FOLFOX, bolus 5-FU omitted starting cycle 2.   Labs are reviewed and discussed with patient Counts acceptable to proceed with FOLFOX treatment today.  Omit bolus 5-FU, oxaliplatin reduce to 66m/m2  #Chemotherapy-induced thrombocytopenia.  Stable counts.   #Constipation,continue current regimen bowel regimen with Colace 100 mg twice daily.  Add MiraLAX daily as needed.  #Morbid obesity, recommend exercise as tolerated.  Healthy diet and lifestyle modification Spanish interpretor was present for the entire encounter.  Supportive care measures are necessary for patient well-being and will be provided as necessary. We spent sufficient time to discuss many aspect of care, questions were answered to patient's satisfaction.   Follow-up in 2 weeks for evaluation prior to next cycle treatments ZEarlie Server MD, PhD Hematology Oncology [facility]at ASelect [facility] Hospital - Battle CreekPager- [phone]/05/2021

## 2021-01-14 NOTE — Progress Notes (Signed)
Pt tolerated all infusions well today and left the chemo suite stable and ambulatory with her 5FU pump infusing as ordered.

## 2021-01-14 NOTE — Patient Instructions (Signed)
[facility]  Discharge Instructions: Thank you for choosing [facility] to provide your oncology and hematology care.  If you have a lab appointment with the [facility], please go directly to the [facility] and check in at the registration area.  Wear comfortable clothing and clothing appropriate for easy access to any Portacath or PICC line.   We strive to give you quality time with your provider. You may need to reschedule your appointment if you arrive late (15 or more minutes).  Arriving late affects you and other patients whose appointments are after yours.  Also, if you miss three or more appointments without notifying the office, you may be dismissed from the clinic at the provider's discretion.      For prescription refill requests, have your pharmacy contact our office and allow 72 hours for refills to be completed.    Today you received the following chemotherapy and/or immunotherapy agents oxaliplatin, leucovorin, 5FU      To help prevent nausea and vomiting after your treatment, we encourage you to take your nausea medication as directed.  BELOW ARE SYMPTOMS THAT SHOULD BE REPORTED IMMEDIATELY: . *FEVER GREATER THAN 100.4 F (38 C) OR HIGHER . *CHILLS OR SWEATING . *NAUSEA AND VOMITING THAT IS NOT CONTROLLED WITH YOUR NAUSEA MEDICATION . *UNUSUAL SHORTNESS OF BREATH . *UNUSUAL BRUISING OR BLEEDING . *URINARY PROBLEMS (pain or burning when urinating, or frequent urination) . *BOWEL PROBLEMS (unusual diarrhea, constipation, pain near the anus) . TENDERNESS IN MOUTH AND THROAT WITH OR WITHOUT PRESENCE OF ULCERS (sore throat, sores in mouth, or a toothache) . UNUSUAL RASH, SWELLING OR PAIN  . UNUSUAL VAGINAL DISCHARGE OR ITCHING   Items with * indicate a potential emergency and should be followed up as soon as possible or go to the Emergency Department if any problems should occur.  Please show the CHEMOTHERAPY ALERT CARD or  IMMUNOTHERAPY ALERT CARD at check-in to the Emergency Department and triage nurse.  Should you have questions after your visit or need to cancel or reschedule your appointment, please contact [facility]  [phone] and follow the prompts.  Office hours are 8:00 a.m. to 4:30 p.m. Monday - Friday. Please note that voicemails left after 4:00 p.m. may not be returned until the following business day.  We are closed weekends and major holidays. You have access to a nurse at all times for urgent questions. Please call the main number to the clinic [phone] and follow the prompts.  For any non-urgent questions, you may also contact your provider using MyChart. We now offer e-Visits for anyone 79 and older to request care online for non-urgent symptoms. For details visit [email or web address].   Also download the MyChart app! Go to the app store, search "MyChart", open the app, select [facility], and log in with your MyChart username and password.  Due to Covid, a mask is required upon entering the hospital/clinic. If you do not have a mask, one will be given to you upon arrival. For doctor visits, patients may have 1 support person aged 68 or older with them. For treatment visits, patients cannot have anyone with them due to current Covid guidelines and our immunocompromised population.   Oxaliplatin Injection Qu es este medicamento? El OXALIPLATINO es un agente quimioteraputico. Este medicamento acta sobre las clulas que se dividen rpidamente, como las clulas cancerosas, y finalmente provoca la muerte de estas clulas. Este medicamento se [place] para Lawyer de  colon y recto, y muchos otros tipos de Hotel manager. Este medicamento puede ser utilizado para otros usos; si tiene alguna pregunta consulte con su proveedor de atencin mdica o con su farmacutico. MARCAS COMUNES: Eloxatin Qu le debo informar a mi profesional de la salud antes de tomar este  medicamento? Necesitan saber si usted presenta alguno de los siguientes problemas o situaciones: enfermedad cardiaca antecedentes de frecuencia cardiaca irregular enfermedad heptica recuentos sanguneos bajos, como baja cantidad de glbulos blancos, plaquetas o glbulos rojos enfermedad pulmonar o respiratoria, como asma si [place] medicamentos que tratan o previenen cogulos sanguneos hormigueo en las manos o los pies, u otro trastorno del sistema nervioso una reaccin alrgica o inusual al oxaliplatino, a otros medicamentos quimioteraputicos, a otros medicamentos, alimentos, colorantes o conservantes si est embarazada o buscando quedar embarazada si est amamantando a un beb Cmo debo [employer]? Este medicamento se administra como infusin en una vena. Un profesional de la salud especialmente capacitado lo administra en un hospital o clnica. Hable con su pediatra para informarse acerca del uso de este medicamento en nios. Puede requerir atencin especial. Sobredosis: Pngase en contacto inmediatamente con un centro toxicolgico o una sala de urgencia si usted cree que haya tomado demasiado medicamento. ATENCIN: [employer] es solo para usted. No comparta este medicamento con nadie. Qu sucede si me olvido de una dosis? Es importante no olvidar ninguna dosis. Informe a su mdico o a su profesional de la salud si no puede asistir a Photographer. Qu puede interactuar con este medicamento? No use este medicamento con ninguno de los siguientes frmacos: cisaprida dronedarona pimozida tioridazina Este medicamento tambin puede interactuar con los siguientes frmacos: aspirina y medicamentos tipo aspirina ciertos medicamentos que tratan o previenen cogulos sanguneos, tales como warfarina, apixabn, dabigatrn y rivaroxabn cisplatino ciclosporina diurticos medicamentos para las infecciones tales como aciclovir, adefovir, anfotericina B, bacitracina, cidofovir, foscarnet,  ganciclovir, gentamicina, pentamidina, vancomicina AINE, medicamentos para el dolor y la inflamacin, tales como ibuprofeno o naproxeno otros medicamentos que prolongan el intervalo QT (un ritmo cardiaco anormal) pamidronato cido zoledrnico Puede ser que esta lista no menciona todas las posibles interacciones. Informe a su profesional de KB Home	[place] de [employer] Corporation productos a base de hierbas, medicamentos de [place] o suplementos nutritivos que est tomando. Si usted fuma, consume bebidas alcohlicas o si utiliza drogas ilegales, indqueselo tambin a su profesional de KB Home	[place]. Algunas sustancias pueden interactuar con su medicamento. A qu debo estar atento al usar Coca-Cola? Se supervisar su estado de salud atentamente mientras reciba este medicamento. Usted podra necesitar realizarse [employer] de sangre mientras est usando [place]. Este medicamento podra hacerle sentir un Nurse, mental health. Esto es normal, ya que la quimioterapia puede afectar tanto a las clulas sanas como a las clulas cancerosas. Si presenta algn efecto secundario, infrmelo. Contine con el tratamiento aun si se siente enfermo, a menos que su profesional de la [employer] indique que lo suspenda. Este medicamento puede aumentar su sensibilidad al fro. No beba bebidas fras ni use hielo. Cubra la piel expuesta antes de entrar en contacto con temperaturas bajas u objetos fros. Cuando est a la [employer], use ropa [place] y cbrase la boca y la nariz para calentar el aire que ingresa a los pulmones. Informe a su mdico si se vuelve sensible al fro. No debe quedar embarazada mientras est tomando este medicamento o por 9 meses despus de dejar de usarlo. Las mujeres deben informar a su profesional de la salud si estn  buscando quedar embarazadas o si creen que podran estar embarazadas. Los hombres no deben Games developer estn recibiendo Coca-Cola y [place] 6 meses despus de dejar de  usarlo. Existe la posibilidad de que ocurran efectos secundarios graves en un beb sin nacer. Para obtener ms informacin, hable con su profesional de KB Home	[place]. No debe amamantar a un nio mientras est usando este medicamento o por 3 meses despus de dejar de usarlo. Este medicamento ha causado insuficiencia ovrica en [employer]. Este medicamento puede causar dificultades para [place]. Hable con su profesional de la salud si le preocupa su fertilidad. Este medicamento ha causado recuentos de esperma reducidos en algunos hombres. Esto puede hacer ms difcil que un hombre embarace a Musician. Hable con su profesional de la salud si le preocupa su fertilidad. Este medicamento puede aumentar su riesgo de contraer una infeccin. Consulte a su profesional de la salud si tiene fiebre, escalofros, dolor de garganta o cualquier otro sntoma de resfro o gripe. No se trate usted mismo. Trate de no acercarse a personas que estn enfermas. Evite usar [employer] contienen aspirina, acetaminofeno, ibuprofeno, naproxeno o ketoprofeno, a menos que as lo indique su profesional de KB Home	[place]. Estos productos pueden ocultar la fiebre. Proceda con cuidado al cepillar sus dientes, usar hilo dental o Risk manager palillos para los dientes, ya que podra contraer una infeccin o Therapist, art con mayor facilidad. Si se somete a algn tratamiento dental, informe a su dentista que est [employer]. Qu efectos secundarios puedo tener al [employer] este medicamento? Efectos secundarios que debe informar a su mdico o a Barrister's clerk de la salud tan pronto como sea posible: Chief of Staff, tales como erupcin cutnea, comezn/picazn o urticaria, e hinchazn de la cara, los labios o la lengua problemas para respirar tos recuentos sanguneos bajos: este medicamento podra reducir la cantidad de glbulos blancos, glbulos rojos y plaquetas. Su riesgo de infeccin y sangrado puede ser mayor nuseas,  vmito dolor, enrojecimiento o Actor de Air cabin crew, hormigueo o entumecimiento de las manos o los pies signos y sntomas de Teacher, music, tales como heces con sangre o de color negro y aspecto alquitranado; [place] de color rojo o marrn oscuro; escupir sangre o material marrn que tiene el aspecto de posos (residuos) de caf; Tree surgeon rojas en la piel; sangrado o moretones inusuales en los ojos, las encas o la nariz signos y sntomas de un cambio peligroso en la frecuencia cardiaca o el ritmo cardiaco, tales como dolor en el pecho; mareos; frecuencia cardiaca rpida, irregular; palpitaciones; sensacin de desmayo o aturdimiento; cadas signos y sntomas de infeccin, tales como fiebre, escalofros, tos, dolor de garganta, Social research officer, government o dificultad para orinar signos y sntomas de lesin en el hgado, tales como orina amarilla oscura o Forensic psychologist; sensacin general de estar enfermo o sntomas gripales; heces claras; prdida del apetito; nuseas; dolor en la regin abdominal superior derecha; debilidad o cansancio inusuales; color amarillento de los ojos o la piel signos y sntomas de baja cantidad de glbulos rojos o anemia, tales como debilidad o cansancio inusuales; sensacin de desmayo o aturdimiento; cadas signos y sntomas de lesin muscular, tales como [place]; problemas para Garment/textile technologist o cambios en la cantidad de orina; debilidad o cansancio inusuales; dolor muscular; dolor en la espalda Efectos secundarios que generalmente no requieren atencin mdica (infrmelos a su mdico o a su profesional de la salud si persisten o si son molestos): cambios en el sentido del gusto diarrea gases cada del cabello prdida  del apetito llagas en la boca Puede ser que esta lista no menciona todos los posibles efectos secundarios. Comunquese a su mdico por asesoramiento mdico [employer]lett[employer]ard. Usted puede informar los efectos secundarios a la FDA por telfono al 1-800-FDA-1088. Dnde debo  guardar mi medicina? Este medicamento se administra en hospitales o clnicas, y no necesitar guardarlo en su domicilio. ATENCIN: Este folleto es un resumen. Puede ser que no cubra toda la posible informacin. Si usted tiene preguntas acerca de esta medicina, consulte con su mdico, su farmacutico o su profesional de Radiographer, therapeutic.  2021 Elsevier/Gold Standard (2019-12-29 00:00:00)  Leucovorin injection Qu es este medicamento? La LEUCOVORINA se [place] para prevenir o tratar [employer] nocivos de ciertos medicamentos. Este medicamento tambin sirve para tratar la anemia provocada por una nivel bajo de cido flico en el cuerpo. Tambin se puede administrar con 5-fluorouracilo (5-FU), para tratar el cncer de colon. Este medicamento puede ser utilizado para otros usos; si tiene alguna pregunta consulte con su proveedor de atencin mdica o con su farmacutico. Qu le debo informar a mi profesional de la salud antes de tomar este medicamento? Necesita saber si usted presenta alguno de los siguientes problemas o situaciones:  anemia debido a bajos niveles de vitamina B-12 en la sangre  una reaccin alrgica o inusual a la leucovorina, cido flico, a otros medicamentos, alimentos, colorantes o conservantes  si est embarazada o buscando quedar embarazada  si est amamantando a un beb Cmo debo utilizar este medicamento? Este medicamento se administra mediante inyeccin por va intramuscular o intravenosa. Lo administra un profesional de Radiographer, therapeutic en un hospital o en un entorno clnico. Hable con su pediatra para informarse acerca del uso de este medicamento en nios. Puede requerir atencin especial. Sobredosis: Pngase en contacto inmediatamente con un centro toxicolgico o una sala de urgencia si usted cree que haya tomado demasiado medicamento. ATENCIN: [employer] American es solo para usted. No comparta este medicamento con nadie. Qu sucede si me olvido de una dosis? No se aplica en este  caso. Qu puede interactuar con este medicamento?  capecitabina  fluorouracilo  fenobarbital  fenitona  primidona  trimetoprima- sulfametoxasol Puede ser que esta lista no menciona todas las posibles interacciones. Informe a su profesional de [employer] de [employer] productos a base de hierbas, medicamentos de [place] o suplementos nutritivos que est tomando. Si usted fuma, consume bebidas alcohlicas o si utiliza drogas ilegales, indqueselo tambin a su profesional de [employer]. Algunas sustancias pueden interactuar con su medicamento. A qu debo estar atento al usar PPL Corporation? Se supervisar su estado de salud atentamente mientras reciba este medicamento. Este medicamento puede aumentar los efectos secundarios de 5-fluorouracilo, 5-FU. Si tiene diarrea o [employer] boca que no mejoran o que [place], consulte a su mdico o a su profesional de [employer]. Qu efectos secundarios puedo tener al [employer] este medicamento? Efectos secundarios que debe informar a su mdico o a Producer, television/film/video de la salud tan pronto como sea posible:  Therapist, art como erupcin cutnea, picazn o urticarias, hinchazn de la cara, labios o lengua  problemas respiratorios  fiebre, infeccin  llagas en la boca  sangrado, magulladuras inusuales  cansancio o debilidad inusual Efectos secundarios que, por lo general, no requieren atencin mdica (debe informarlos a su mdico o a su profesional de la salud si persisten o si son molestos):  estreimiento o diarrea  prdida del apetito  nuseas, vmito Puede ser que esta lista no [employer]  posibles efectos secundarios. Comunquese a su mdico por asesoramiento mdico [employer]. Usted puede informar los efectos secundarios a la FDA por telfono al 1-800-FDA-1088. Dnde debo guardar mi medicina? Este medicamento se administra en hospitales o clnicas y no necesitar guardarlo en su domicilio. ATENCIN:  Este folleto es un resumen. Puede ser que no cubra toda la posible informacin. Si usted tiene preguntas acerca de esta medicina, consulte con su mdico, su farmacutico o su profesional de Technical sales engineer.  2021 Elsevier/Gold Standard (2014-10-17 00:00:00)   Fluorouracil, 5-FU injection Qu es este medicamento? El [place], 5-FU es un agente quimioteraputico. Desacelera el crecimiento de clulas cancerosas. Este medicamento se [place] para tratar muchos tipos de cncer, tales como cncer de mama, cncer de colon o rectal, cncer de pncreas y cncer de estmago. Este medicamento puede ser utilizado para otros usos; si tiene alguna pregunta consulte con su proveedor de atencin mdica o con su farmacutico. MARCAS COMUNES: [place] Qu le debo informar a mi profesional de la salud antes de tomar este medicamento? Necesitan saber si usted presenta alguno de los siguientes problemas o situaciones: trastornos sanguneos deficiencia de dihidropirimidina deshidrogenasa (DPD) infeccin (especialmente infecciones virales, como varicela, fuegos labiales o herpes) enfermedad renal enfermedad heptica desnutricin, nutricin deficiente terapia de radiacin en curso o reciente una reaccin alrgica o inusual al fluorouracilo, a otros medicamentos quimioteraputicos, a otros medicamentos, alimentos, colorantes o conservantes si est embarazada o buscando quedar embarazada si est amamantando a un beb Cmo debo utilizar este medicamento? Este medicamento se administra como infusin o inyeccin en una vena. Un profesional de la salud especialmente capacitado lo administra en un hospital o clnica. Hable con su pediatra para informarse acerca del uso de este medicamento en nios. Puede requerir atencin especial. Sobredosis: Pngase en contacto inmediatamente con un centro toxicolgico o una sala de urgencia si usted cree que haya tomado demasiado medicamento. ATENCIN: [employer] es solo para usted. No  comparta este medicamento con nadie. Qu sucede si me olvido de una dosis? Es importante no olvidar ninguna dosis. Informe a su mdico o a su profesional de la salud si no puede asistir a Photographer. Qu puede interactuar con este medicamento? No use este medicamento con ninguno de los siguientes frmacos: vacunas de virus vivos Este medicamento tambin puede interactuar con los siguientes frmacos: medicamentos que tratan o previenen cogulos sanguneos, tales como warfarina, enoxaparina y dalteparina Puede ser que esta lista no menciona todas las posibles interacciones. Informe a su profesional de KB Home	[place] de [employer] Corporation productos a base de hierbas, medicamentos de [place] o suplementos nutritivos que est tomando. Si usted fuma, consume bebidas alcohlicas o si utiliza drogas ilegales, indqueselo tambin a su profesional de KB Home	[place]. Algunas sustancias pueden interactuar con su medicamento. A qu debo estar atento al usar Coca-Cola? Visite a su mdico para que revise su evolucin. Este medicamento podra hacerle sentir un Nurse, mental health. Esto es normal, ya que la quimioterapia puede afectar tanto a las clulas sanas como a las clulas cancerosas. Si presenta algn efecto secundario, infrmelo. Contine con el tratamiento aun si se siente enfermo, a menos que su mdico le indique que lo suspenda. En algunos casos, podra recibir [employer]ands para ayudarlo con los efectos secundarios. Siga todas las instrucciones para usarlos. Llame a su mdico o a su profesional de la salud si tiene fiebre, escalofros o dolor de garganta, o cualquier otro sntoma de resfro o gripe. No se trate usted mismo. Este medicamento reduce la capacidad del  cuerpo para combatir infecciones. Trate de no acercarse a personas que estn enfermas. Este medicamento podra aumentar el riesgo de moretones o sangrado. Consulte a su mdico o a su profesional de la salud si observa sangrados inusuales. Proceda  con cuidado al cepillar sus dientes, usar hilo dental o Risk manager palillos para los dientes, ya que podra contraer una infeccin o Therapist, art con mayor facilidad. Si se somete a algn tratamiento dental, informe a su dentista que est [employer]. Evite usar productos que contienen aspirina, acetaminofeno, ibuprofeno, naproxeno o ketoprofeno, a menos que as lo indique su mdico. Estos productos pueden ocultar la fiebre. No debe quedar embarazada mientras est [employer]. Las mujeres deben informar a su mdico si estn buscando quedar embarazadas o si creen que podran estar embarazadas. Existe la posibilidad de efectos secundarios graves en un beb sin nacer. Para obtener ms informacin, hable con su profesional de la salud o su farmacutico. No debe Economist a un beb mientras est usando este medicamento. Los hombres deben informar a su mdico si quieren tener hijos. Este medicamento puede reducir el recuento de esperma. No trate la diarrea con productos de [employer]. Contacte a su mdico si tiene diarrea por ms de 2 das, o si es grave y [place]. Este medicamento puede aumentar su sensibilidad al sol. Evite la [employer] Corporation. Si no la Product manager, utilice ropa protectora y crema de Photographer. No utilice lmparas solares, camas solares ni cabinas solares. Qu efectos secundarios puedo tener al [employer] Corporation este medicamento? Efectos secundarios que debe informar a su mdico o a Barrister's clerk de la salud tan pronto como sea posible: Chief of Staff, tales como erupcin cutnea, comezn/picazn o urticaria, e hinchazn de la cara, los labios o la lengua recuentos sanguneos bajos: este medicamento podra reducir la cantidad de glbulos blancos, glbulos rojos y plaquetas. Su riesgo de infeccin y sangrado podra ser mayor. signos de infeccin: fiebre o escalofros, tos, dolor de garganta, dolor o dificultad para orinar signos de disminucin en la cantidad de plaquetas o  sangrado: moretones, puntos rojos en la piel, heces de color negro y aspecto alquitranado, sangre en la orina signos de disminucin en la cantidad de glbulos rojos: debilidad o cansancio inusuales, Youth worker, aturdimiento problemas para respirar cambios en la visin Market researcher en la boca nuseas y vmito dolor, hinchazn, enrojecimiento en TEFL teacher de Air cabin crew, hormigueo o entumecimiento de las manos o los pies enrojecimiento, hinchazn o Pension scheme manager en las manos o los pies dolor estomacal sangrado inusual Efectos secundarios que generalmente no requieren atencin mdica (infrmelos a su mdico o a su profesional de la salud si persisten o si son molestos): cambios en las uas de los pies o de las manos diarrea comezn/picazn o sequedad de la piel cada del cabello dolor de cabeza prdida del apetito sensibilidad de los ojos a la luz Tree surgeon estomacal ojos inusualmente llorosos Puede ser que esta lista no menciona todos los posibles efectos secundarios. Comunquese a su mdico por asesoramiento mdico [employer]. Usted puede informar los efectos secundarios a la FDA por telfono al 1-800-FDA-1088. Dnde debo guardar mi medicina? Este medicamento se administra en hospitales o clnicas, y no necesitar guardarlo en su domicilio. ATENCIN: Este folleto es un resumen. Puede ser que no cubra toda la posible informacin. Si usted tiene preguntas acerca de esta medicina, consulte con su mdico, su farmacutico o su profesional de Technical sales engineer.  2021 Elsevier/Gold Standard (2019-12-29 00:00:00)

## 2021-01-14 NOTE — Progress Notes (Signed)
Pt here for follow up. No new concerns voiced.   

## 2021-01-16 ENCOUNTER — Inpatient Hospital Stay: Payer: Self-pay

## 2021-01-16 VITALS — BP 126/80 | HR 77 | Temp 98.0°F | Resp 18

## 2021-01-16 DIAGNOSIS — C187 Malignant neoplasm of sigmoid colon: Secondary | ICD-10-CM

## 2021-01-16 MED ORDER — HEPARIN SOD (PORK) LOCK FLUSH 100 UNIT/ML IV SOLN
500.0000 [IU] | Freq: Once | INTRAVENOUS | Status: AC | PRN
  Administered 2021-01-16: 500 [IU]
  Filled 2021-01-16: qty 5

## 2021-01-16 MED ORDER — HEPARIN SOD (PORK) LOCK FLUSH 100 UNIT/ML IV SOLN
INTRAVENOUS | Status: AC
  Filled 2021-01-16: qty 5

## 2021-01-16 MED ORDER — SODIUM CHLORIDE 0.9% FLUSH
10.0000 mL | INTRAVENOUS | Status: DC | PRN
  Administered 2021-01-16: 10 mL
  Filled 2021-01-16: qty 10

## 2021-01-28 ENCOUNTER — Inpatient Hospital Stay (HOSPITAL_BASED_OUTPATIENT_CLINIC_OR_DEPARTMENT_OTHER): Payer: Self-pay | Admitting: Oncology

## 2021-01-28 ENCOUNTER — Other Ambulatory Visit: Payer: Self-pay

## 2021-01-28 ENCOUNTER — Encounter: Payer: Self-pay | Admitting: Oncology

## 2021-01-28 ENCOUNTER — Inpatient Hospital Stay: Payer: Self-pay

## 2021-01-28 VITALS — BP 117/71 | HR 72 | Temp 98.1°F | Resp 18 | Wt 193.5 lb

## 2021-01-28 DIAGNOSIS — C187 Malignant neoplasm of sigmoid colon: Secondary | ICD-10-CM

## 2021-01-28 DIAGNOSIS — D6959 Other secondary thrombocytopenia: Secondary | ICD-10-CM

## 2021-01-28 DIAGNOSIS — T451X5A Adverse effect of antineoplastic and immunosuppressive drugs, initial encounter: Secondary | ICD-10-CM

## 2021-01-28 DIAGNOSIS — E669 Obesity, unspecified: Secondary | ICD-10-CM

## 2021-01-28 DIAGNOSIS — D6481 Anemia due to antineoplastic chemotherapy: Secondary | ICD-10-CM

## 2021-01-28 DIAGNOSIS — Z95828 Presence of other vascular implants and grafts: Secondary | ICD-10-CM

## 2021-01-28 DIAGNOSIS — Z5111 Encounter for antineoplastic chemotherapy: Secondary | ICD-10-CM

## 2021-01-28 DIAGNOSIS — G629 Polyneuropathy, unspecified: Secondary | ICD-10-CM

## 2021-01-28 LAB — CBC WITH DIFFERENTIAL/PLATELET
Abs Immature Granulocytes: 0 10*3/uL (ref 0.00–0.07)
Basophils Absolute: 0 10*3/uL (ref 0.0–0.1)
Basophils Relative: 0 %
Eosinophils Absolute: 0.1 10*3/uL (ref 0.0–0.5)
Eosinophils Relative: 2 %
HCT: 34.3 % — ABNORMAL LOW (ref 36.0–46.0)
Hemoglobin: 11.5 g/dL — ABNORMAL LOW (ref 12.0–15.0)
Immature Granulocytes: 0 %
Lymphocytes Relative: 37 %
Lymphs Abs: 1 10*3/uL (ref 0.7–4.0)
MCH: 30.1 pg (ref 26.0–34.0)
MCHC: 33.5 g/dL (ref 30.0–36.0)
MCV: 89.8 fL (ref 80.0–100.0)
Monocytes Absolute: 0.5 10*3/uL (ref 0.1–1.0)
Monocytes Relative: 17 %
Neutro Abs: 1.2 10*3/uL — ABNORMAL LOW (ref 1.7–7.7)
Neutrophils Relative %: 44 %
Platelets: 89 10*3/uL — ABNORMAL LOW (ref 150–400)
RBC: 3.82 MIL/uL — ABNORMAL LOW (ref 3.87–5.11)
RDW: 14.7 % (ref 11.5–15.5)
WBC: 2.8 10*3/uL — ABNORMAL LOW (ref 4.0–10.5)
nRBC: 0 % (ref 0.0–0.2)

## 2021-01-28 LAB — COMPREHENSIVE METABOLIC PANEL
ALT: 22 U/L (ref 0–44)
AST: 40 U/L (ref 15–41)
Albumin: 3.7 g/dL (ref 3.5–5.0)
Alkaline Phosphatase: 130 U/L — ABNORMAL HIGH (ref 38–126)
Anion gap: 8 (ref 5–15)
BUN: 8 mg/dL (ref 6–20)
CO2: 25 mmol/L (ref 22–32)
Calcium: 9.1 mg/dL (ref 8.9–10.3)
Chloride: 105 mmol/L (ref 98–111)
Creatinine, Ser: 0.5 mg/dL (ref 0.44–1.00)
GFR, Estimated: >60 mL/min (ref >60)
Glucose, Bld: 136 mg/dL — ABNORMAL HIGH (ref 70–99)
Potassium: 3.7 mmol/L (ref 3.5–5.1)
Sodium: 138 mmol/L (ref 135–145)
Total Bilirubin: 0.8 mg/dL (ref 0.3–1.2)
Total Protein: 7.1 g/dL (ref 6.5–8.1)

## 2021-01-28 MED ORDER — SODIUM CHLORIDE 0.9% FLUSH
10.0000 mL | Freq: Once | INTRAVENOUS | Status: AC
  Administered 2021-01-28: 10 mL via INTRAVENOUS
  Filled 2021-01-28: qty 10

## 2021-01-28 MED ORDER — DEXTROSE 5 % IV SOLN
Freq: Once | INTRAVENOUS | Status: AC
  Filled 2021-01-28: qty 250

## 2021-01-28 MED ORDER — LEUCOVORIN CALCIUM INJECTION 350 MG
410.0000 mg/m2 | Freq: Once | INTRAVENOUS | Status: AC
  Administered 2021-01-28: 750 mg via INTRAVENOUS
  Filled 2021-01-28: qty 17.5

## 2021-01-28 MED ORDER — PALONOSETRON HCL INJECTION 0.25 MG/5ML
0.2500 mg | Freq: Once | INTRAVENOUS | Status: AC
  Administered 2021-01-28: 0.25 mg via INTRAVENOUS
  Filled 2021-01-28: qty 5

## 2021-01-28 MED ORDER — GABAPENTIN 100 MG PO CAPS: 100.0000 mg | ORAL_CAPSULE | Freq: Every day | ORAL | 1 refills | Status: DC

## 2021-01-28 MED ORDER — SODIUM CHLORIDE 0.9 % IV SOLN
2400.0000 mg/m2 | INTRAVENOUS | Status: DC
  Administered 2021-01-28: 4400 mg via INTRAVENOUS
  Filled 2021-01-28: qty 88

## 2021-01-28 MED ORDER — HEPARIN SOD (PORK) LOCK FLUSH 100 UNIT/ML IV SOLN
500.0000 [IU] | Freq: Once | INTRAVENOUS | Status: DC
  Filled 2021-01-28: qty 5

## 2021-01-28 MED ORDER — OXALIPLATIN CHEMO INJECTION 100 MG/20ML
75.0000 mg/m2 | Freq: Once | INTRAVENOUS | Status: AC
  Administered 2021-01-28: 135 mg via INTRAVENOUS
  Filled 2021-01-28: qty 20

## 2021-01-28 MED ORDER — SODIUM CHLORIDE 0.9 % IV SOLN
10.0000 mg | Freq: Once | INTRAVENOUS | Status: AC
  Administered 2021-01-28: 10 mg via INTRAVENOUS
  Filled 2021-01-28: qty 10

## 2021-01-28 NOTE — Progress Notes (Signed)
Pt here for follow up. Pt reports numbness to feet and hands. Also, reports getting easily tired.

## 2021-01-28 NOTE — Patient Instructions (Signed)
[facility]   Discharge Instructions: Thank you for choosing [facility] to provide your oncology and hematology care.  If you have a lab appointment with the [facility], please go directly to the [facility] and check in at the registration area.  Wear comfortable clothing and clothing appropriate for easy access to any Portacath or PICC line.   We strive to give you quality time with your provider. You may need to reschedule your appointment if you arrive late (15 or more minutes).  Arriving late affects you and other patients whose appointments are after yours.  Also, if you miss three or more appointments without notifying the office, you may be dismissed from the clinic at the provider's discretion.      For prescription refill requests, have your pharmacy contact our office and allow 72 hours for refills to be completed.    Today you received the following chemotherapy and/or immunotherapy agents Oxaliplatin & Fluorouracil   Fluorouracil, 5-FU injection Qu es este medicamento? El [place], 5-FU es un agente quimioteraputico. Desacelera el crecimiento de clulas cancerosas. Este medicamento se [place] para tratar muchos tipos de cncer, tales como cncer de mama, cncer de colon o rectal, cncer de pncreas y cncer de estmago. Este medicamento puede ser utilizado para otros usos; si tiene alguna pregunta consulte con su proveedor de atencin mdica o con su farmacutico. MARCAS COMUNES: Adrucil Qu le debo informar a mi profesional de la salud antes de tomar este medicamento? Necesitan saber si usted presenta alguno de los siguientes problemas o situaciones: trastornos sanguneos deficiencia de dihidropirimidina deshidrogenasa (DPD) infeccin (especialmente infecciones virales, como varicela, fuegos labiales o herpes) enfermedad renal enfermedad heptica desnutricin, nutricin deficiente terapia de radiacin en curso o reciente  una reaccin alrgica o inusual al fluorouracilo, a otros medicamentos quimioteraputicos, a otros medicamentos, alimentos, colorantes o conservantes si est embarazada o buscando quedar embarazada si est amamantando a un beb Cmo debo utilizar este medicamento? Este medicamento se administra como infusin o inyeccin en una vena. Un profesional de la salud especialmente capacitado lo administra en un hospital o clnica. Hable con su pediatra para informarse acerca del uso de este medicamento en nios. Puede requerir atencin especial. Sobredosis: Pngase en contacto inmediatamente con un centro toxicolgico o una sala de urgencia si usted cree que haya tomado demasiado medicamento. ATENCIN: [employer] es solo para usted. No comparta este medicamento con nadie. Qu sucede si me olvido de una dosis? Es importante no olvidar ninguna dosis. Informe a su mdico o a su profesional de la salud si no puede asistir a Photographer. Qu puede interactuar con este medicamento? No use este medicamento con ninguno de los siguientes frmacos: vacunas de virus vivos Este medicamento tambin puede interactuar con los siguientes frmacos: medicamentos que tratan o previenen cogulos sanguneos, tales como warfarina, enoxaparina y dalteparina Puede ser que esta lista no menciona todas las posibles interacciones. Informe a su profesional de KB Home	[place] de [employer] productos a base de hierbas, medicamentos de [place] o suplementos nutritivos que est tomando. Si usted fuma, consume bebidas alcohlicas o si utiliza drogas ilegales, indqueselo tambin a su profesional de KB Home	[place]. Algunas sustancias pueden interactuar con su medicamento. A qu debo estar atento al usar Coca-Cola? Visite a su mdico para que revise su evolucin. Este medicamento podra hacerle sentir un Nurse, mental health. Esto es normal, ya que la quimioterapia puede afectar tanto a las clulas sanas como a las clulas cancerosas. Si  presenta algn efecto secundario, infrmelo. Contine con el tratamiento aun si se siente enfermo, a menos que su mdico le indique que lo suspenda. En algunos casos, podra recibir [employer] para ayudarlo con los efectos secundarios. Siga todas las instrucciones para usarlos. Llame a su mdico o a su profesional de la salud si tiene fiebre, escalofros o dolor de garganta, o cualquier otro sntoma de resfro o gripe. No se trate usted mismo. Este medicamento reduce la capacidad del cuerpo para combatir infecciones. Trate de no acercarse a personas que estn enfermas. Este medicamento podra aumentar el riesgo de moretones o sangrado. Consulte a su mdico o a su profesional de la salud si observa sangrados inusuales. Proceda con cuidado al cepillar sus dientes, usar hilo dental o Risk manager palillos para los dientes, ya que podra contraer una infeccin o Therapist, art con mayor facilidad. Si se somete a algn tratamiento dental, informe a su dentista que est [employer]. Evite usar productos que contienen aspirina, acetaminofeno, ibuprofeno, naproxeno o ketoprofeno, a menos que as lo indique su mdico. Estos productos pueden ocultar la fiebre. No debe quedar embarazada mientras est [employer]. Las mujeres deben informar a su mdico si estn buscando quedar embarazadas o si creen que podran estar embarazadas. Existe la posibilidad de efectos secundarios graves en un beb sin nacer. Para obtener ms informacin, hable con su profesional de la salud o su farmacutico. No debe Economist a un beb mientras est usando este medicamento. Los hombres deben informar a su mdico si quieren tener hijos. Este medicamento puede reducir el recuento de esperma. No trate la diarrea con productos de [employer]. Contacte a su mdico si tiene diarrea por ms de 2 das, o si es grave y [place]. Este medicamento puede aumentar su sensibilidad al sol. Evite la [employer]. Si no la Psychologist, sport and exercise, utilice ropa protectora y crema de Photographer. No utilice lmparas solares, camas solares ni cabinas solares. Qu efectos secundarios puedo tener al [employer] este medicamento? Efectos secundarios que debe informar a su mdico o a Barrister's clerk de la salud tan pronto como sea posible: Chief of Staff, tales como erupcin cutnea, comezn/picazn o urticaria, e hinchazn de la cara, los labios o la lengua recuentos sanguneos bajos: este medicamento podra reducir la cantidad de glbulos blancos, glbulos rojos y plaquetas. Su riesgo de infeccin y sangrado podra ser mayor. signos de infeccin: fiebre o escalofros, tos, dolor de garganta, dolor o dificultad para orinar signos de disminucin en la cantidad de plaquetas o sangrado: moretones, puntos rojos en la piel, heces de color negro y aspecto alquitranado, sangre en la orina signos de disminucin en la cantidad de glbulos rojos: debilidad o cansancio inusuales, Youth worker, aturdimiento problemas para respirar cambios en la visin Market researcher en la boca nuseas y vmito dolor, hinchazn, enrojecimiento en TEFL teacher de Air cabin crew, hormigueo o entumecimiento de las manos o los pies enrojecimiento, hinchazn o Pension scheme manager en las manos o los pies dolor estomacal sangrado inusual Efectos secundarios que generalmente no requieren atencin mdica (infrmelos a su mdico o a su profesional de la salud si persisten o si son molestos): cambios en las uas de los pies o de las manos diarrea comezn/picazn o sequedad de la piel cada del cabello dolor de cabeza prdida del apetito sensibilidad de los ojos a la luz Tree surgeon estomacal ojos inusualmente llorosos Puede ser que esta lista no menciona todos los posibles efectos secundarios. Comunquese a su mdico por asesoramiento mdico [employer]  secundarios. Usted puede informar los efectos secundarios a la FDA por telfono al 1-800-FDA-1088. Dnde debo guardar mi  medicina? Este medicamento se administra en hospitales o clnicas, y no necesitar guardarlo en su domicilio. ATENCIN: Este folleto es un resumen. Puede ser que no cubra toda la posible informacin. Si usted tiene preguntas acerca de esta medicina, consulte con su mdico, su farmacutico o su profesional de Technical sales engineer.  2021 Elsevier/Gold Standard (2019-12-29 00:00:00)  Oxaliplatin Injection Qu es este medicamento? El OXALIPLATINO es un agente quimioteraputico. Este medicamento acta sobre las clulas que se dividen rpidamente, como las clulas cancerosas, y finalmente provoca la muerte de estas clulas. Este medicamento se [place] para tratar Science writer de colon y recto, y muchos otros tipos de Hotel manager. Este medicamento puede ser utilizado para otros usos; si tiene alguna pregunta consulte con su proveedor de atencin mdica o con su farmacutico. MARCAS COMUNES: Eloxatin Qu le debo informar a mi profesional de la salud antes de tomar este medicamento? Necesitan saber si usted presenta alguno de los siguientes problemas o situaciones: enfermedad cardiaca antecedentes de frecuencia cardiaca irregular enfermedad heptica recuentos sanguneos bajos, como baja cantidad de glbulos blancos, plaquetas o glbulos rojos enfermedad pulmonar o respiratoria, como asma si [place] medicamentos que tratan o previenen cogulos sanguneos hormigueo en las manos o los pies, u otro trastorno del sistema nervioso una reaccin alrgica o inusual al oxaliplatino, a otros medicamentos quimioteraputicos, a otros medicamentos, alimentos, colorantes o conservantes si est embarazada o buscando quedar embarazada si est amamantando a un beb Cmo debo [employer]? Este medicamento se administra como infusin en una vena. Un profesional de la salud especialmente capacitado lo administra en un hospital o clnica. Hable con su pediatra para informarse acerca del uso de este medicamento en nios. Puede requerir  atencin especial. Sobredosis: Pngase en contacto inmediatamente con un centro toxicolgico o una sala de urgencia si usted cree que haya tomado demasiado medicamento. ATENCIN: [employer] es solo para usted. No comparta este medicamento con nadie. Qu sucede si me olvido de una dosis? Es importante no olvidar ninguna dosis. Informe a su mdico o a su profesional de la salud si no puede asistir a Photographer. Qu puede interactuar con este medicamento? No use este medicamento con ninguno de los siguientes frmacos: cisaprida dronedarona pimozida tioridazina Este medicamento tambin puede interactuar con los siguientes frmacos: aspirina y medicamentos tipo aspirina ciertos medicamentos que tratan o previenen cogulos sanguneos, tales como warfarina, apixabn, dabigatrn y rivaroxabn cisplatino ciclosporina diurticos medicamentos para las infecciones tales como aciclovir, adefovir, anfotericina B, bacitracina, cidofovir, foscarnet, ganciclovir, gentamicina, pentamidina, vancomicina AINE, medicamentos para el dolor y la inflamacin, tales como ibuprofeno o naproxeno otros medicamentos que prolongan el intervalo QT (un ritmo cardiaco anormal) pamidronato cido zoledrnico Puede ser que esta lista no menciona todas las posibles interacciones. Informe a su profesional de KB Home	[place] de [employer] productos a base de hierbas, medicamentos de [place] o suplementos nutritivos que est tomando. Si usted fuma, consume bebidas alcohlicas o si utiliza drogas ilegales, indqueselo tambin a su profesional de KB Home	[place]. Algunas sustancias pueden interactuar con su medicamento. A qu debo estar atento al usar Coca-Cola? Se supervisar su estado de salud atentamente mientras reciba este medicamento. Usted podra necesitar realizarse [employer] de sangre mientras est usando [place]. Este medicamento podra hacerle sentir un Nurse, mental health. Esto es normal, ya que la quimioterapia puede afectar  tanto a las clulas sanas como a las clulas cancerosas. Si presenta algn efecto secundario,  infrmelo. Contine con el tratamiento aun si se siente enfermo, a menos que su profesional de la [employer] indique que lo suspenda. Este medicamento puede aumentar su sensibilidad al fro. No beba bebidas fras ni use hielo. Cubra la piel expuesta antes de entrar en contacto con temperaturas bajas u objetos fros. Cuando est a la [employer], use ropa [place] y cbrase la boca y la nariz para calentar el aire que ingresa a los pulmones. Informe a su mdico si se vuelve sensible al fro. No debe quedar embarazada mientras est tomando este medicamento o por 9 meses despus de dejar de usarlo. Las mujeres deben informar a su profesional de la salud si estn buscando quedar embarazadas o si creen que podran estar embarazadas. Los hombres no deben Games developer estn recibiendo Coca-Cola y [place] 6 meses despus de dejar de usarlo. Existe la posibilidad de que ocurran efectos secundarios graves en un beb sin nacer. Para obtener ms informacin, hable con su profesional de KB Home	[place]. No debe amamantar a un nio mientras est usando este medicamento o por 3 meses despus de dejar de usarlo. Este medicamento ha causado insuficiencia ovrica en [employer]. Este medicamento puede causar dificultades para [place]. Hable con su profesional de la salud si le preocupa su fertilidad. Este medicamento ha causado recuentos de esperma reducidos en algunos hombres. Esto puede hacer ms difcil que un hombre embarace a Musician. Hable con su profesional de la salud si le preocupa su fertilidad. Este medicamento puede aumentar su riesgo de contraer una infeccin. Consulte a su profesional de la salud si tiene fiebre, escalofros, dolor de garganta o cualquier otro sntoma de resfro o gripe. No se trate usted mismo. Trate de no acercarse a personas que estn enfermas. Evite usar [employer] contienen aspirina, acetaminofeno, ibuprofeno, naproxeno o ketoprofeno, a menos que as lo indique su profesional de KB Home	[place]. Estos productos pueden ocultar la fiebre. Proceda con cuidado al cepillar sus dientes, usar hilo dental o Risk manager palillos para los dientes, ya que podra contraer una infeccin o Therapist, art con mayor facilidad. Si se somete a algn tratamiento dental, informe a su dentista que est [employer]. Qu efectos secundarios puedo tener al [employer] este medicamento? Efectos secundarios que debe informar a su mdico o a Barrister's clerk de la salud tan pronto como sea posible: Chief of Staff, tales como erupcin cutnea, comezn/picazn o urticaria, e hinchazn de la cara, los labios o la lengua problemas para respirar tos recuentos sanguneos bajos: este medicamento podra reducir la cantidad de glbulos blancos, glbulos rojos y plaquetas. Su riesgo de infeccin y sangrado puede ser mayor nuseas, vmito dolor, enrojecimiento o Actor de Air cabin crew, hormigueo o entumecimiento de las manos o los pies signos y sntomas de Teacher, music, tales como heces con sangre o de color negro y aspecto alquitranado; [place] de color rojo o marrn oscuro; escupir sangre o material marrn que tiene el aspecto de posos (residuos) de caf; Tree surgeon rojas en la piel; sangrado o moretones inusuales en los ojos, las encas o la nariz signos y sntomas de un cambio peligroso en la frecuencia cardiaca o el ritmo cardiaco, tales como dolor en el pecho; mareos; frecuencia cardiaca rpida, irregular; palpitaciones; sensacin de desmayo o aturdimiento; cadas signos y sntomas de infeccin, tales como fiebre, escalofros, tos, dolor de garganta, dolor o dificultad para orinar signos y sntomas de lesin en el hgado, tales como orina amarilla oscura o [place]; sensacin general de estar  enfermo o sntomas gripales; heces claras; prdida del apetito; nuseas; dolor en la regin  abdominal superior derecha; debilidad o cansancio inusuales; color amarillento de los ojos o la piel signos y sntomas de baja cantidad de glbulos rojos o anemia, tales como debilidad o cansancio inusuales; sensacin de desmayo o aturdimiento; cadas signos y sntomas de lesin muscular, tales como orina oscura; problemas para Garment/textile technologist o cambios en la cantidad de orina; debilidad o cansancio inusuales; dolor muscular; dolor en la espalda Efectos secundarios que generalmente no requieren atencin mdica (infrmelos a su mdico o a su profesional de la salud si persisten o si son molestos): cambios en el sentido del gusto diarrea gases cada del cabello prdida del apetito llagas en la boca Puede ser que esta lista no menciona todos los posibles efectos secundarios. Comunquese a su mdico por asesoramiento mdico [employer]. Usted puede informar los efectos secundarios a la FDA por telfono al 1-800-FDA-1088. Dnde debo guardar mi medicina? Este medicamento se administra en hospitales o clnicas, y no necesitar guardarlo en su domicilio. ATENCIN: Este folleto es un resumen. Puede ser que no cubra toda la posible informacin. Si usted tiene preguntas acerca de esta medicina, consulte con su mdico, su farmacutico o su profesional de Technical sales engineer.  2021 Elsevier/Gold Standard (2019-12-29 00:00:00)

## 2021-01-28 NOTE — Progress Notes (Signed)
.   Hematology/Oncology Progress Note [facility] Telephone:([phone] [phone]  Patient Care Team: [name], MD as PCP - General (Family Medicine) [name], RN as Registered Nurse [name], RN as Registered Nurse   Name of the patient: Debra [name]  [id number]  [birth year]  Date of visit: 01/28/21  PERTINENT ONCOLOGY HISTORY Debra [name] is a 55 y.o.afemale who has above oncology history reviewed by me today presented for follow up visit for management of Stage IIIB Colon cancer seen by me on 11/21/2019 during her hospitalization. Patient has a history of Covid positivity. Severe iron deficiency status post IV Venofer treatment. Colonoscopy 11/21/2019 showed a malignant appearing severe stenosis found in the sigmoid colon at 25 cm proximal to the anus and was nontransversed.  Biopsy was taken.  Surgical pathology is pending  Nonbleeding external hemorrhoids Biopsy was not diagnostic 01/03/2020 virtual colonoscopy showed long segment area of wall thickening and annular constriction within the sigmoid colon in the area of previous concern most compatible with annular malignancy.  Remainder of the colon evaluation is somewhat limited due to large amount of retained stool and incomplete distention proximal to the annular constricting lesion. 01/30/2020 Patient underwent left hemicolectomy by Dr. Celine Ahr and a biopsy showed invasive adenocarcinoma, poorly differentiated.  37 lymph nodes negative for malignancy.  Grade 3, all margins are uninvolved.  Perineural invasion present.  Tumor deposit present. pT3 pN1c cM0 Checks x-ray did not show any lung mass. #Postop, patient developed postop ileus and she required TPN, PICC and NG tube placement.  Postoperation day 9 she continued to have increased leukocytosis and was subsequently found to have a midline wound infection to the inferior portion of the wound.  Patient was treated with  antibiotics.  07/03/2020 CT abdomen pelvis w contrast showed well positioned drainage catheter with apparent resolution of the intra-abdominal abscess. 07/10/2020 seen by [facility] and patient had drainage catheter removed by IR on 07/12/2020   INTERVAL HISTORY Debra [name] is a 55 y.o. female who has above history reviewed by me today presents for follow up visit for management of stage IIIb colon cancer Problems and complaints are listed below: Patient is on adjuvant FOLFOX.   She has  neuropathy symptoms, toes and finger tips, she noticed symptoms are now constant on her toes.  Appetite is fair, stable weight No nausea vomiting diarrhea   Review of systems- Review of Systems  Constitutional: Positive for fatigue. Negative for appetite change, chills, fever and unexpected weight change.  HENT:   Negative for hearing loss and voice change.   Eyes: Negative for eye problems.  Respiratory: Negative for chest tightness, cough and shortness of breath.   Cardiovascular: Negative for chest pain.  Gastrointestinal: Negative for abdominal distention, abdominal pain, blood in stool and constipation.  Endocrine: Negative for hot flashes.  Genitourinary: Negative for difficulty urinating, dysuria and frequency.   Musculoskeletal: Negative for arthralgias.  Skin: Negative for itching and wound.  Neurological: Positive for numbness. Negative for extremity weakness, headaches and light-headedness.  Hematological: Negative for adenopathy.  Psychiatric/Behavioral: Negative for confusion.    No Known Allergies  Patient Active Problem List   Diagnosis Date Noted  . Encounter for antineoplastic chemotherapy 09/03/2020  . Encounter for central line placement   . Loss of weight 02/15/2020  . Goals of care, counseling/discussion 02/15/2020  . Malignant neoplasm of sigmoid colon ([place]) 02/15/2020  . Pressure injury of skin 02/03/2020  . Bowel obstruction ([place]) 01/29/2020  .  Iron deficiency  anemia due to chronic blood loss 11/22/2019  . COVID-19 virus detected 11/20/2019  . Symptomatic anemia 11/20/2019  . Hypothyroidism 11/20/2019  . Iron deficiency anemia 11/20/2019  . GIB (gastrointestinal bleeding) 11/20/2019     Past Medical History:  Diagnosis Date  . Anemia   . Colon cancer ([place]) 04/2020  . Hypothyroidism   . Thyroid disease      Past Surgical History:  Procedure Laterality Date  . COLONOSCOPY WITH PROPOFOL N/A 11/21/2019   Procedure: COLONOSCOPY WITH PROPOFOL;  Surgeon: Lin Landsman, MD;  Location: [facility] ENDOSCOPY;  Service: Gastroenterology;  Laterality: N/A;  . CYSTOSCOPY WITH STENT PLACEMENT Left 01/30/2020   Procedure: CYSTOSCOPY WITH STENT PLACEMENT;  Surgeon: Hollice Espy, MD;  Location: [facility] ORS;  Service: Urology;  Laterality: Left;  . ESOPHAGOGASTRODUODENOSCOPY (EGD) WITH PROPOFOL N/A 11/21/2019   Procedure: ESOPHAGOGASTRODUODENOSCOPY (EGD) WITH PROPOFOL;  Surgeon: Lin Landsman, MD;  Location[facility] ENDOSCOPY;  Service: Gastroenterology;  Laterality: N/A;  . IR RADIOLOGIST EVAL & MGMT  07/03/2020  . IR RADIOLOGIST EVAL & MGMT  07/12/2020  . PARTIAL COLECTOMY N/A 01/30/2020   Procedure: PARTIAL COLECTOMY Sigmoid;  Surgeon: [name], MD;  Location: [place] ORS;  Service: General;  Laterality: N/A;  . PORTACATH PLACEMENT Left 03/28/2020   Procedure: INSERTION PORT-A-CATH;  Surgeon: [name], MD;  Location: [facility] ORS;  Service: General;  Laterality: Left;  . PORTACATH PLACEMENT N/A 08/29/2020   Procedure: INSERTION PORT-A-CATH, chemotherapy;  Surgeon: [name], MD;  Location: [facility] ORS;  Service: General;  Laterality: N/A;    Social History   Socioeconomic History  . Marital status: Single    Spouse name: Not on file  . Number of children: Not on file  . Years of education: Not on file  . Highest education level: Not on file  Occupational History  . Occupation: Factory  Tobacco Use  . Smoking status: Never Smoker   . Smokeless tobacco: Never Used  Vaping Use  . Vaping Use: Never used  Substance and Sexual Activity  . Alcohol use: Never  . Drug use: Never  . Sexual activity: Not on file  Other Topics Concern  . Not on file  Social History Narrative   Lives at home with significant other   Social Determinants of Health   Financial Resource Strain: Not on file  Food Insecurity: Not on file  Transportation Needs: Not on file  Physical Activity: Not on file  Stress: Not on file  Social Connections: Not on file  Intimate Partner Violence: Not on file     Family History  Problem Relation Age of Onset  . Diabetes Sister      Current Outpatient Medications:  .  B-D 3CC LUER-LOK SYR 25GX1" 25G X 1" 3 ML MISC, Use with B12 injection, Disp: , Rfl:  .  cyanocobalamin (,VITAMIN B-12,) 1000 MCG/ML injection, Inject 1,000 mcg into the muscle every 30 (thirty) days., Disp: , Rfl:  .  docusate sodium (COLACE) 100 MG capsule, Take 1 capsule (100 mg total) by mouth daily., Disp: 30 capsule, Rfl: 1 .  hydrocortisone cream 0.5 %, Apply 1 application topically 2 (two) times daily. Until rash is healed. (Patient not taking: Reported on 01/14/2021), Disp: 30 g, Rfl: 0 .  ibuprofen (ADVIL) 800 MG tablet, Take 1 tablet (800 mg total) by mouth every 8 (eight) hours as needed., Disp: 30 tablet, Rfl: 0 .  levothyroxine (SYNTHROID) 137 MCG tablet, Take 137 mcg by mouth daily before breakfast. , Disp: , Rfl:  .  lidocaine-prilocaine (EMLA) cream, Apply to affected area once, Disp: 30 g, Rfl: 3 .  magic mouthwash w/lidocaine SOLN, Take 5 mLs by mouth 4 (four) times daily as needed for mouth pain. Sig: Swish/Spit 5-10 ml four times a day as needed. Dispense 480 ml. 1RF, Disp: 480 mL, Rfl: 1 .  nystatin (MYCOSTATIN/NYSTOP) powder, Apply 1 application topically 3 (three) times daily., Disp: 30 g, Rfl: 0 .  ondansetron (ZOFRAN) 8 MG tablet, Take 1 tablet (8 mg total) by mouth 2 (two) times daily as needed for refractory  nausea / vomiting. Start on day 3 after chemotherapy., Disp: 30 tablet, Rfl: 1 .  polyethylene glycol (MIRALAX) 17 g packet, Take 17 g by mouth daily as needed. (Patient not taking: No sig reported), Disp: 14 each, Rfl: 0 .  potassium chloride (KLOR-CON) 10 MEQ tablet, Take 1 tablet (10 mEq total) by mouth daily. (Patient not taking: Reported on 01/14/2021), Disp: 30 tablet, Rfl: 1 .  prochlorperazine (COMPAZINE) 10 MG tablet, Take 1 tablet (10 mg total) by mouth every 6 (six) hours as needed (Nausea or vomiting)., Disp: 30 tablet, Rfl: 1 No current facility-administered medications for this visit.  Facility-Administered Medications Ordered in Other Visits:  .  heparin lock flush 100 unit/mL, 500 Units, Intravenous, Once, Earlie Server, MD   Physical exam:  Vitals:   01/28/21 0846  BP: 117/71  Pulse: 72  Resp: 18  Temp: 98.1 F (36.7 C)  Weight: 193 lb 8 oz (87.8 kg)   Physical Exam Constitutional:      General: She is not in acute distress.    Appearance: She is obese. She is not diaphoretic.     Comments: Patient walks independently  HENT:     Head: Normocephalic and atraumatic.     Nose: Nose normal.     Mouth/Throat:     Pharynx: No oropharyngeal exudate.  Eyes:     General: No scleral icterus.    Pupils: Pupils are equal, round, and reactive to light.  Cardiovascular:     Rate and Rhythm: Normal rate and regular rhythm.     Heart sounds: No murmur heard.   Pulmonary:     Effort: Pulmonary effort is normal. No respiratory distress.     Breath sounds: No rales.  Chest:     Chest wall: No tenderness.  Abdominal:     General: There is no distension.     Palpations: Abdomen is soft.     Tenderness: There is no abdominal tenderness.  Musculoskeletal:        General: Normal range of motion.     Cervical back: Normal range of motion and neck supple.  Skin:    General: Skin is warm and dry.     Findings: No erythema.     Comments: Right side anterior chest wall + Mediport   Neurological:     Mental Status: She is alert and oriented to person, place, and time.     Cranial Nerves: No cranial nerve deficit.     Motor: No abnormal muscle tone.     Coordination: Coordination normal.  Psychiatric:        Mood and Affect: Affect normal.       CMP Latest Ref Rng & Units 01/14/2021  Glucose 70 - 99 mg/dL 139(H)  BUN 6 - 20 mg/dL 12  Creatinine 0.44 - 1.00 mg/dL 0.49  Sodium 135 - 145 mmol/L 137  Potassium 3.5 - 5.1 mmol/L 3.5  Chloride 98 - 111 mmol/L 106  CO2  22 - 32 mmol/L 24  Calcium 8.9 - 10.3 mg/dL 8.9  Total Protein 6.5 - 8.1 g/dL 6.9  Total Bilirubin 0.3 - 1.2 mg/dL 0.5  Alkaline Phos 38 - 126 U/L 127(H)  AST 15 - 41 U/L 40  ALT 0 - 44 U/L 19   CBC Latest Ref Rng & Units 01/14/2021  WBC 4.0 - 10.5 K/uL 3.1(L)  Hemoglobin 12.0 - 15.0 g/dL 11.9(L)  Hematocrit 36.0 - 46.0 % 36.0  Platelets 150 - 400 K/uL 89(L)    RADIOGRAPHIC STUDIES: I have personally reviewed the radiological images as listed and agreed with the findings in the report. No results found.  Assessment and plan-  1. Malignant neoplasm of sigmoid colon ([place])   2. Encounter for antineoplastic chemotherapy   3. Chemotherapy-induced thrombocytopenia   4. Anemia due to antineoplastic chemotherapy   5. Obesity (BMI 35.0-39.9 without comorbidity)   6. Neuropathy   Cancer Staging Malignant neoplasm of sigmoid colon [facility]) Staging form: Colon and Rectum, AJCC 8th Edition - Pathologic: Stage IIIB (pT3, pN1c, cM0) - Signed by Earlie Server, MD on 02/15/2020  #Stage IIIb colon cancer- 01/30/2020 pT3 pN1c, MMR intact  On adjuvant FOLFOX, bolus 5-FU omitted starting cycle 2.   Labs are reviewed and discussed with patient. Counts are acceptable to proceed with FOLFOX treatment today.  Omit bolus 5-FU, oxaliplatin reduce to 64m/m2  #Chemotherapy-induced thrombocytopenia.  Stable counts, 89000  #Constipation,continue current regimen bowel regimen with Colace 100 mg twice daily.  Add MiraLAX daily  as needed. #Grade 2 chemotherapy induced neuropathy. Add gabapentin 1026mQHS.  #Morbid obesity, recommend exercise as tolerated.  Healthy diet and lifestyle modification Spanish interpretor was present for the entire encounter.  Supportive care measures are necessary for patient well-being and will be provided as necessary. We spent sufficient time to discuss many aspect of care, questions were answered to patient's satisfaction.   Follow-up in 2 weeks for evaluation prior to next cycle treatments ZhEarlie ServerMD, PhD Hematology Oncology CoSioux Falls Specialty [facility], LLPt [facility]- [phone]/23/2022

## 2021-01-30 ENCOUNTER — Inpatient Hospital Stay: Payer: Self-pay

## 2021-01-30 VITALS — BP 121/79 | HR 75 | Temp 98.6°F | Resp 16

## 2021-01-30 DIAGNOSIS — C187 Malignant neoplasm of sigmoid colon: Secondary | ICD-10-CM

## 2021-01-30 MED ORDER — HEPARIN SOD (PORK) LOCK FLUSH 100 UNIT/ML IV SOLN
500.0000 [IU] | Freq: Once | INTRAVENOUS | Status: AC | PRN
  Administered 2021-01-30: 500 [IU]
  Filled 2021-01-30: qty 5

## 2021-01-30 MED ORDER — HEPARIN SOD (PORK) LOCK FLUSH 100 UNIT/ML IV SOLN
INTRAVENOUS | Status: AC
  Filled 2021-01-30: qty 5

## 2021-01-30 MED ORDER — SODIUM CHLORIDE 0.9% FLUSH
10.0000 mL | INTRAVENOUS | Status: DC | PRN
  Administered 2021-01-30: 10 mL
  Filled 2021-01-30: qty 10

## 2021-02-11 ENCOUNTER — Inpatient Hospital Stay: Payer: Self-pay | Attending: Oncology

## 2021-02-11 ENCOUNTER — Other Ambulatory Visit: Payer: Self-pay

## 2021-02-11 ENCOUNTER — Encounter: Payer: Self-pay | Admitting: Internal Medicine

## 2021-02-11 ENCOUNTER — Inpatient Hospital Stay (HOSPITAL_BASED_OUTPATIENT_CLINIC_OR_DEPARTMENT_OTHER): Payer: Self-pay | Admitting: Internal Medicine

## 2021-02-11 ENCOUNTER — Inpatient Hospital Stay: Payer: Self-pay

## 2021-02-11 ENCOUNTER — Ambulatory Visit: Payer: Self-pay | Admitting: Oncology

## 2021-02-11 DIAGNOSIS — K5900 Constipation, unspecified: Secondary | ICD-10-CM | POA: Insufficient documentation

## 2021-02-11 DIAGNOSIS — G620 Drug-induced polyneuropathy: Secondary | ICD-10-CM | POA: Insufficient documentation

## 2021-02-11 DIAGNOSIS — E039 Hypothyroidism, unspecified: Secondary | ICD-10-CM | POA: Insufficient documentation

## 2021-02-11 DIAGNOSIS — T451X5A Adverse effect of antineoplastic and immunosuppressive drugs, initial encounter: Secondary | ICD-10-CM | POA: Insufficient documentation

## 2021-02-11 DIAGNOSIS — C187 Malignant neoplasm of sigmoid colon: Secondary | ICD-10-CM | POA: Insufficient documentation

## 2021-02-11 DIAGNOSIS — Z79899 Other long term (current) drug therapy: Secondary | ICD-10-CM | POA: Insufficient documentation

## 2021-02-11 DIAGNOSIS — D6959 Other secondary thrombocytopenia: Secondary | ICD-10-CM | POA: Insufficient documentation

## 2021-02-11 DIAGNOSIS — Z5111 Encounter for antineoplastic chemotherapy: Secondary | ICD-10-CM | POA: Insufficient documentation

## 2021-02-11 LAB — CBC WITH DIFFERENTIAL/PLATELET
Abs Immature Granulocytes: 0.01 10*3/uL (ref 0.00–0.07)
Basophils Absolute: 0 10*3/uL (ref 0.0–0.1)
Basophils Relative: 0 %
Eosinophils Absolute: 0.1 10*3/uL (ref 0.0–0.5)
Eosinophils Relative: 3 %
HCT: 34 % — ABNORMAL LOW (ref 36.0–46.0)
Hemoglobin: 11.4 g/dL — ABNORMAL LOW (ref 12.0–15.0)
Immature Granulocytes: 0 %
Lymphocytes Relative: 38 %
Lymphs Abs: 1 10*3/uL (ref 0.7–4.0)
MCH: 30.5 pg (ref 26.0–34.0)
MCHC: 33.5 g/dL (ref 30.0–36.0)
MCV: 90.9 fL (ref 80.0–100.0)
Monocytes Absolute: 0.5 10*3/uL (ref 0.1–1.0)
Monocytes Relative: 19 %
Neutro Abs: 1 10*3/uL — ABNORMAL LOW (ref 1.7–7.7)
Neutrophils Relative %: 40 %
Platelets: 73 10*3/uL — ABNORMAL LOW (ref 150–400)
RBC: 3.74 MIL/uL — ABNORMAL LOW (ref 3.87–5.11)
RDW: 15.1 % (ref 11.5–15.5)
WBC: 2.5 10*3/uL — ABNORMAL LOW (ref 4.0–10.5)
nRBC: 0 % (ref 0.0–0.2)

## 2021-02-11 LAB — COMPREHENSIVE METABOLIC PANEL
ALT: 18 U/L (ref 0–44)
AST: 36 U/L (ref 15–41)
Albumin: 3.6 g/dL (ref 3.5–5.0)
Alkaline Phosphatase: 110 U/L (ref 38–126)
Anion gap: 9 (ref 5–15)
BUN: 7 mg/dL (ref 6–20)
CO2: 24 mmol/L (ref 22–32)
Calcium: 9 mg/dL (ref 8.9–10.3)
Chloride: 105 mmol/L (ref 98–111)
Creatinine, Ser: 0.41 mg/dL — ABNORMAL LOW (ref 0.44–1.00)
GFR, Estimated: >60 mL/min (ref >60)
Glucose, Bld: 151 mg/dL — ABNORMAL HIGH (ref 70–99)
Potassium: 3.4 mmol/L — ABNORMAL LOW (ref 3.5–5.1)
Sodium: 138 mmol/L (ref 135–145)
Total Bilirubin: 0.7 mg/dL (ref 0.3–1.2)
Total Protein: 6.8 g/dL (ref 6.5–8.1)

## 2021-02-11 MED ORDER — HEPARIN SOD (PORK) LOCK FLUSH 100 UNIT/ML IV SOLN
500.0000 [IU] | Freq: Once | INTRAVENOUS | Status: AC
  Administered 2021-02-11: 500 [IU] via INTRAVENOUS
  Filled 2021-02-11: qty 5

## 2021-02-11 MED ORDER — HEPARIN SOD (PORK) LOCK FLUSH 100 UNIT/ML IV SOLN
INTRAVENOUS | Status: AC
  Filled 2021-02-11: qty 5

## 2021-02-11 MED ORDER — SODIUM CHLORIDE 0.9% FLUSH
10.0000 mL | Freq: Once | INTRAVENOUS | Status: AC
  Administered 2021-02-11: 10 mL via INTRAVENOUS
  Filled 2021-02-11: qty 10

## 2021-02-11 NOTE — Progress Notes (Signed)
[place] NOTE  Patient Care Team: [name], MD as PCP - General (Family Medicine) [name], RN as Registered Nurse [name], RN as Registered Nurse  CHIEF COMPLAINTS/PURPOSE OF CONSULTATION: Colon cancer  #  Oncology History  Malignant neoplasm of sigmoid colon [facility])  02/15/2020 Initial Diagnosis   Colon cancer ([place])    02/15/2020 Cancer Staging   Staging form: Colon and Rectum, AJCC 8th Edition - Pathologic: Stage IIIB (pT3, pN1c, cM0) - Signed by Earlie Server, MD on 02/15/2020    09/03/2020 -  Chemotherapy    Patient is on Treatment Plan: COLORECTAL FOLFOX Q14D X 3 MONTHS          HISTORY OF PRESENTING ILLNESS:  Debra [name] 55 y.o.  female patient with stage III colon cancer is here to proceed with adjuvant chemotherapy with FOLFOX.  Patient denies any nausea vomiting.  However she does complain of constipation.  Currently on Colace/MiraLAX.  Patient denies any complaints of easy bruising or gum bleeds.  Complains of tingling and numbness in extremities.   Review of Systems  Constitutional:  Positive for malaise/fatigue. Negative for chills, diaphoresis, fever and weight loss.  HENT:  Negative for nosebleeds and sore throat.   Eyes:  Negative for double vision.  Respiratory:  Negative for cough, hemoptysis, sputum production, shortness of breath and wheezing.   Cardiovascular:  Negative for chest pain, palpitations, orthopnea and leg swelling.  Gastrointestinal:  Positive for constipation. Negative for abdominal pain, blood in stool, diarrhea, heartburn, melena, nausea and vomiting.  Genitourinary:  Negative for dysuria, frequency and urgency.  Musculoskeletal:  Positive for joint pain. Negative for back pain.  Skin: Negative.  Negative for itching and rash.  Neurological:  Positive for tingling. Negative for dizziness, focal weakness, weakness and headaches.  Endo/Heme/Allergies:  Does not bruise/bleed easily.   Psychiatric/Behavioral:  Negative for depression. The patient is not nervous/anxious and does not have insomnia.     MEDICAL HISTORY:  Past Medical History:  Diagnosis Date   Anemia    Colon cancer ([place]) 04/2020   Hypothyroidism    Thyroid disease     SURGICAL HISTORY: Past Surgical History:  Procedure Laterality Date   COLONOSCOPY WITH PROPOFOL N/A 11/21/2019   Procedure: COLONOSCOPY WITH PROPOFOL;  Surgeon: Lin Landsman, MD;  Location: [facility] ENDOSCOPY;  Service: Gastroenterology;  Laterality: N/A;   CYSTOSCOPY WITH STENT PLACEMENT Left 01/30/2020   Procedure: CYSTOSCOPY WITH STENT PLACEMENT;  Surgeon: Hollice Espy, MD;  Location: [facility] ORS;  Service: Urology;  Laterality: Left;   ESOPHAGOGASTRODUODENOSCOPY (EGD) WITH PROPOFOL N/A 11/21/2019   Procedure: ESOPHAGOGASTRODUODENOSCOPY (EGD) WITH PROPOFOL;  Surgeon: Lin Landsman, MD;  Location: [facility] ENDOSCOPY;  Service: Gastroenterology;  Laterality: N/A;   IR RADIOLOGIST EVAL & MGMT  07/03/2020   IR RADIOLOGIST EVAL & MGMT  07/12/2020   PARTIAL COLECTOMY N/A 01/30/2020   Procedure: PARTIAL COLECTOMY Sigmoid;  Surgeon: [name], MD;  Location: [place] ORS;  Service: General;  Laterality: N/A;   PORTACATH PLACEMENT Left 03/28/2020   Procedure: INSERTION PORT-A-CATH;  Surgeon: [name], MD;  Location: [facility] ORS;  Service: General;  Laterality: Left;   PORTACATH PLACEMENT N/A 08/29/2020   Procedure: INSERTION PORT-A-CATH, chemotherapy;  Surgeon: [name], MD;  Location: [facility] ORS;  Service: General;  Laterality: N/A;    SOCIAL HISTORY: Social History   Socioeconomic History   Marital status: Single    Spouse name: Not on file   Number of children: Not on file  Years of education: Not on file   Highest education level: Not on file  Occupational History   Occupation: Factory  Tobacco Use   Smoking status: Never   Smokeless tobacco: Never  Vaping Use   Vaping Use: Never used  Substance and Sexual  Activity   Alcohol use: Never   Drug use: Never   Sexual activity: Not on file  Other Topics Concern   Not on file  Social History Narrative   Lives at home with significant other   Social Determinants of Health   Financial Resource Strain: Not on file  Food Insecurity: Not on file  Transportation Needs: Not on file  Physical Activity: Not on file  Stress: Not on file  Social Connections: Not on file  Intimate Partner Violence: Not on file    FAMILY HISTORY: Family History  Problem Relation Age of Onset   Diabetes Sister     ALLERGIES:  has No Known Allergies.  MEDICATIONS:  Current Outpatient Medications  Medication Sig Dispense Refill   B-D 3CC LUER-LOK SYR 25GX1" 25G X 1" 3 ML MISC Use with B12 injection (Patient not taking: Reported on 02/18/2021)     docusate sodium (COLACE) 100 MG capsule Take 1 capsule (100 mg total) by mouth daily. 30 capsule 1   gabapentin (NEURONTIN) 100 MG capsule Take 1 capsule (100 mg total) by mouth at bedtime. 30 capsule 1   ibuprofen (ADVIL) 800 MG tablet Take 1 tablet (800 mg total) by mouth every 8 (eight) hours as needed. 30 tablet 0   levothyroxine (SYNTHROID) 137 MCG tablet Take 137 mcg by mouth daily before breakfast.      lidocaine-prilocaine (EMLA) cream Apply to affected area once 30 g 3   nystatin (MYCOSTATIN/NYSTOP) powder Apply 1 application topically 3 (three) times daily. (Patient not taking: Reported on 02/18/2021) 30 g 0   polyethylene glycol (MIRALAX) 17 g packet Take 17 g by mouth daily as needed. 14 each 0   cyanocobalamin (,VITAMIN B-12,) 1000 MCG/ML injection Inject 1,000 mcg into the muscle every 30 (thirty) days. (Patient not taking: No sig reported)     hydrocortisone cream 0.5 % Apply 1 application topically 2 (two) times daily. Until rash is healed. (Patient not taking: No sig reported) 30 g 0   magic mouthwash w/lidocaine SOLN Take 5 mLs by mouth 4 (four) times daily as needed for mouth pain. Sig: Swish/Spit 5-10 ml  four times a day as needed. Dispense 480 ml. 1RF 480 mL 1   ondansetron (ZOFRAN) 8 MG tablet Take 1 tablet (8 mg total) by mouth 2 (two) times daily as needed for refractory nausea / vomiting. Start on day 3 after chemotherapy. 30 tablet 1   potassium chloride (KLOR-CON) 10 MEQ tablet Take 1 tablet (10 mEq total) by mouth daily. (Patient not taking: No sig reported) 30 tablet 1   prochlorperazine (COMPAZINE) 10 MG tablet Take 1 tablet (10 mg total) by mouth every 6 (six) hours as needed (Nausea or vomiting). (Patient not taking: No sig reported) 30 tablet 1   No current facility-administered medications for this visit.      Marland Kitchen  PHYSICAL EXAMINATION: ECOG PERFORMANCE STATUS: 2 - Symptomatic, <50% confined to bed  Vitals:   02/11/21 0905  BP: 119/75  Pulse: 67  Resp: 18  Temp: (!) 97.3 F (36.3 C)  SpO2: 100%   Filed Weights   02/11/21 0905  Weight: 192 lb 3.2 oz (87.2 kg)    Physical Exam Vitals and nursing note reviewed.  HENT:     Head: Normocephalic and atraumatic.     Mouth/Throat:     Pharynx: Oropharynx is clear.  Eyes:     Extraocular Movements: Extraocular movements intact.     Pupils: Pupils are equal, round, and reactive to light.  Cardiovascular:     Rate and Rhythm: Normal rate and regular rhythm.  Pulmonary:     Comments: Decreased breath sounds bilaterally.  Abdominal:     Palpations: Abdomen is soft.  Musculoskeletal:        General: Normal range of motion.     Cervical back: Normal range of motion.  Skin:    General: Skin is warm.  Neurological:     General: No focal deficit present.     Mental Status: She is alert and oriented to person, place, and time.  Psychiatric:        Behavior: Behavior normal.        Judgment: Judgment normal.     LABORATORY DATA:  I have reviewed the data as listed Lab Results  Component Value Date   WBC 2.9 (L) 02/18/2021   HGB 11.8 (L) 02/18/2021   HCT 34.9 (L) 02/18/2021   MCV 90.2 02/18/2021   PLT 85 (L)  02/18/2021   Recent Labs    04/26/20 1155 05/17/20 1052 05/30/20 1215 08/13/20 1325 01/28/21 0833 02/11/21 0852 02/18/21 0855  NA 135 139 134*   < > 138 138 138  K 4.0 3.7 3.8   < > 3.7 3.4* 3.4*  CL 101 103 100   < > 105 105 106  CO2 '27 26 24   ' < > '25 24 24  ' GLUCOSE 113* 96 114*   < > 136* 151* 163*  BUN '7 8 8   ' < > '8 7 7  ' CREATININE 0.45 0.45 0.52   < > 0.50 0.41* 0.45  CALCIUM 8.8* 9.3 9.2   < > 9.1 9.0 9.0  GFRNONAA >60 >60 >60   < > >60 >60 >60  GFRAA >60 >60 >60  --   --   --   --   PROT  --  8.2*  --    < > 7.1 6.8 7.1  ALBUMIN  --  4.0  --    < > 3.7 3.6 3.6  AST  --  35  --    < > 40 36 39  ALT  --  22  --    < > '22 18 19  ' ALKPHOS  --  92  --    < > 130* 110 134*  BILITOT  --  0.6  --    < > 0.8 0.7 0.7   < > = values in this interval not displayed.    RADIOGRAPHIC STUDIES: I have personally reviewed the radiological images as listed and agreed with the findings in the report. No results found.  ASSESSMENT & PLAN:   Malignant neoplasm of sigmoid colon ([place])  #Stage IIIb colon cancer- 01/30/2020 pT3 pN1c, MMR intact; On adjuvant FOLFOX, bolus 5-FU omitted starting cycle 2.    # HOLD FOLFOX chemo today- sec low counts [see below]; platelets 73.   #Chemotherapy-induced thrombocytopenia-worse 73; hold chemotherapy as above   #Constipation continue Colace; MiraLAX as needed.  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  # DISPOSITION:  # HOLD chemo today; de-access # in 1 week follow with Dr.Yu- Labs- cbc/cmp; FOLFOX chemo; post 48 hours- pump de-access- Dr.B    All questions were answered. The patient knows to  call the clinic with any problems, questions or concerns.       Cammie Sickle, MD 02/24/2021 5:58 PM

## 2021-02-11 NOTE — Assessment & Plan Note (Addendum)
#  Stage IIIb colon cancer- 01/30/2020 pT3 pN1c, MMR intact; On adjuvant FOLFOX, bolus 5-FU omitted starting cycle 2.    # HOLD FOLFOX chemo today- sec low counts [see below]; platelets 73.  #Chemotherapy-induced thrombocytopenia-worse 73; hold chemotherapy as above  #Constipation continue Colace; MiraLAX as needed.  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  # DISPOSITION:  # HOLD chemo today; de-access # in 1 week follow with Dr.Yu- Labs- cbc/cmp; FOLFOX chemo; post 48 hours- pump de-access- Dr.B

## 2021-02-12 LAB — CEA: CEA: 4.2 ng/mL (ref 0.0–4.7)

## 2021-02-13 ENCOUNTER — Inpatient Hospital Stay: Payer: Self-pay

## 2021-02-18 ENCOUNTER — Other Ambulatory Visit: Payer: Self-pay

## 2021-02-18 ENCOUNTER — Inpatient Hospital Stay: Payer: Self-pay

## 2021-02-18 ENCOUNTER — Encounter: Payer: Self-pay | Admitting: Oncology

## 2021-02-18 ENCOUNTER — Inpatient Hospital Stay (HOSPITAL_BASED_OUTPATIENT_CLINIC_OR_DEPARTMENT_OTHER): Payer: Self-pay | Admitting: Oncology

## 2021-02-18 VITALS — BP 116/72 | HR 71 | Temp 98.3°F | Resp 18 | Wt 191.5 lb

## 2021-02-18 DIAGNOSIS — T451X5A Adverse effect of antineoplastic and immunosuppressive drugs, initial encounter: Secondary | ICD-10-CM

## 2021-02-18 DIAGNOSIS — E876 Hypokalemia: Secondary | ICD-10-CM

## 2021-02-18 DIAGNOSIS — G629 Polyneuropathy, unspecified: Secondary | ICD-10-CM

## 2021-02-18 DIAGNOSIS — C187 Malignant neoplasm of sigmoid colon: Secondary | ICD-10-CM

## 2021-02-18 DIAGNOSIS — D6481 Anemia due to antineoplastic chemotherapy: Secondary | ICD-10-CM

## 2021-02-18 DIAGNOSIS — D6959 Other secondary thrombocytopenia: Secondary | ICD-10-CM

## 2021-02-18 DIAGNOSIS — Z5111 Encounter for antineoplastic chemotherapy: Secondary | ICD-10-CM

## 2021-02-18 LAB — COMPREHENSIVE METABOLIC PANEL
ALT: 19 U/L (ref 0–44)
AST: 39 U/L (ref 15–41)
Albumin: 3.6 g/dL (ref 3.5–5.0)
Alkaline Phosphatase: 134 U/L — ABNORMAL HIGH (ref 38–126)
Anion gap: 8 (ref 5–15)
BUN: 7 mg/dL (ref 6–20)
CO2: 24 mmol/L (ref 22–32)
Calcium: 9 mg/dL (ref 8.9–10.3)
Chloride: 106 mmol/L (ref 98–111)
Creatinine, Ser: 0.45 mg/dL (ref 0.44–1.00)
GFR, Estimated: >60 mL/min (ref >60)
Glucose, Bld: 163 mg/dL — ABNORMAL HIGH (ref 70–99)
Potassium: 3.4 mmol/L — ABNORMAL LOW (ref 3.5–5.1)
Sodium: 138 mmol/L (ref 135–145)
Total Bilirubin: 0.7 mg/dL (ref 0.3–1.2)
Total Protein: 7.1 g/dL (ref 6.5–8.1)

## 2021-02-18 LAB — CBC WITH DIFFERENTIAL/PLATELET
Abs Immature Granulocytes: 0.02 10*3/uL (ref 0.00–0.07)
Basophils Absolute: 0 10*3/uL (ref 0.0–0.1)
Basophils Relative: 1 %
Eosinophils Absolute: 0.1 10*3/uL (ref 0.0–0.5)
Eosinophils Relative: 3 %
HCT: 34.9 % — ABNORMAL LOW (ref 36.0–46.0)
Hemoglobin: 11.8 g/dL — ABNORMAL LOW (ref 12.0–15.0)
Immature Granulocytes: 1 %
Lymphocytes Relative: 36 %
Lymphs Abs: 1 10*3/uL (ref 0.7–4.0)
MCH: 30.5 pg (ref 26.0–34.0)
MCHC: 33.8 g/dL (ref 30.0–36.0)
MCV: 90.2 fL (ref 80.0–100.0)
Monocytes Absolute: 0.6 10*3/uL (ref 0.1–1.0)
Monocytes Relative: 21 %
Neutro Abs: 1.1 10*3/uL — ABNORMAL LOW (ref 1.7–7.7)
Neutrophils Relative %: 38 %
Platelets: 85 10*3/uL — ABNORMAL LOW (ref 150–400)
RBC: 3.87 MIL/uL (ref 3.87–5.11)
RDW: 15.2 % (ref 11.5–15.5)
WBC: 2.9 10*3/uL — ABNORMAL LOW (ref 4.0–10.5)
nRBC: 0 % (ref 0.0–0.2)

## 2021-02-18 MED ORDER — HEPARIN SOD (PORK) LOCK FLUSH 100 UNIT/ML IV SOLN
500.0000 [IU] | Freq: Once | INTRAVENOUS | Status: DC
  Filled 2021-02-18: qty 5

## 2021-02-18 MED ORDER — SODIUM CHLORIDE 0.9 % IV SOLN
2400.0000 mg/m2 | INTRAVENOUS | Status: DC
  Administered 2021-02-18: 4400 mg via INTRAVENOUS
  Filled 2021-02-18: qty 88

## 2021-02-18 MED ORDER — OXALIPLATIN CHEMO INJECTION 100 MG/20ML
75.0000 mg/m2 | Freq: Once | INTRAVENOUS | Status: AC
  Administered 2021-02-18: 135 mg via INTRAVENOUS
  Filled 2021-02-18: qty 20

## 2021-02-18 MED ORDER — PALONOSETRON HCL INJECTION 0.25 MG/5ML
0.2500 mg | Freq: Once | INTRAVENOUS | Status: AC
  Administered 2021-02-18: 0.25 mg via INTRAVENOUS
  Filled 2021-02-18: qty 5

## 2021-02-18 MED ORDER — SODIUM CHLORIDE 0.9% FLUSH
10.0000 mL | Freq: Once | INTRAVENOUS | Status: DC
  Filled 2021-02-18: qty 10

## 2021-02-18 MED ORDER — DEXTROSE 5 % IV SOLN
Freq: Once | INTRAVENOUS | Status: AC
  Filled 2021-02-18: qty 250

## 2021-02-18 MED ORDER — SODIUM CHLORIDE 0.9 % IV SOLN
10.0000 mg | Freq: Once | INTRAVENOUS | Status: AC
  Administered 2021-02-18: 10 mg via INTRAVENOUS
  Filled 2021-02-18: qty 10

## 2021-02-18 MED ORDER — LEUCOVORIN CALCIUM INJECTION 350 MG
750.0000 mg | Freq: Once | INTRAVENOUS | Status: AC
  Administered 2021-02-18: 750 mg via INTRAVENOUS
  Filled 2021-02-18: qty 25

## 2021-02-18 NOTE — Progress Notes (Signed)
.   Hematology/Oncology Progress Note [facility] Telephone:([phone] [phone]  Patient Care Team: [name], MD as PCP - General (Family Medicine) [name], RN as Registered Nurse [name], RN as Registered Nurse   Name of the patient: Debra [name]  [id number]  1966/07/12  Date of visit: 02/18/21  PERTINENT ONCOLOGY HISTORY Debra [name] is a 55 y.o.afemale who has above oncology history reviewed by me today presented for follow up visit for management of Stage IIIB Colon cancer seen by me on 11/21/2019 during her hospitalization. Patient has a history of Covid positivity. Severe iron deficiency status post IV Venofer treatment. Colonoscopy 11/21/2019 showed a malignant appearing severe stenosis found in the sigmoid colon at 25 cm proximal to the anus and was nontransversed.  Biopsy was taken.  Surgical pathology is pending  Nonbleeding external hemorrhoids Biopsy was not diagnostic 01/03/2020 virtual colonoscopy showed long segment area of wall thickening and annular constriction within the sigmoid colon in the area of previous concern most compatible with annular malignancy.  Remainder of the colon evaluation is somewhat limited due to large amount of retained stool and incomplete distention proximal to the annular constricting lesion. 01/30/2020 Patient underwent left hemicolectomy by Dr. Celine Ahr and a biopsy showed invasive adenocarcinoma, poorly differentiated.  37 lymph nodes negative for malignancy.  Grade 3, all margins are uninvolved.  Perineural invasion present.  Tumor deposit present. pT3 pN1c cM0 Checks x-ray did not show any lung mass. #Postop, patient developed postop ileus and she required TPN, PICC and NG tube placement.  Postoperation day 9 she continued to have increased leukocytosis and was subsequently found to have a midline wound infection to the inferior portion of the wound.  Patient was treated with  antibiotics.  07/03/2020 CT abdomen pelvis w contrast showed well positioned drainage catheter with apparent resolution of the intra-abdominal abscess. 07/10/2020 seen by [facility] and patient had drainage catheter removed by IR on 07/12/2020   INTERVAL HISTORY [name] is a 55 y.o. female who has above history reviewed by me today presents for follow up visit for management of stage IIIb colon cancer Problems and complaints are listed below: Patient is on adjuvant FOLFOX.  She was accompanied by daughter today. Patient was seen by covering physician last week.  Chemotherapy was held due to thrombocytopenia. Today patient denies any bleeding events. No nausea vomiting diarrhea Reviewed chronic neuropathy symptoms are stable Reports some intermittent lower extremity swelling after prolonged sitting or walking  Review of systems- Review of Systems  Constitutional:  Positive for fatigue. Negative for appetite change, chills, fever and unexpected weight change.  HENT:   Negative for hearing loss and voice change.   Eyes:  Negative for eye problems.  Respiratory:  Negative for chest tightness, cough and shortness of breath.   Cardiovascular:  Negative for chest pain.  Gastrointestinal:  Negative for abdominal distention, abdominal pain, blood in stool and constipation.  Endocrine: Negative for hot flashes.  Genitourinary:  Negative for difficulty urinating, dysuria and frequency.   Musculoskeletal:  Negative for arthralgias.  Skin:  Negative for itching and wound.  Neurological:  Positive for numbness. Negative for extremity weakness, headaches and light-headedness.  Hematological:  Negative for adenopathy.  Psychiatric/Behavioral:  Negative for confusion.    No Known Allergies  Patient Active Problem List   Diagnosis Date Noted   Encounter for antineoplastic chemotherapy 09/03/2020   Encounter for central line placement    Loss of weight 02/15/2020  Goals of care,  counseling/discussion 02/15/2020   Malignant neoplasm of sigmoid colon ([place]ra [name]) 02/15/2020   Pressure injury of skin 02/03/2020   Bowel obstruction ([place]) 01/29/2020   Iron deficiency anemia due to chronic blood loss 11/22/2019   COVID-19 virus detected 11/20/2019   Symptomatic anemia 11/20/2019   Hypothyroidism 11/20/2019   Iron deficiency anemia 11/20/2019   GIB (gastrointestinal bleeding) 11/20/2019     Past Medical History:  Diagnosis Date   Anemia    Colon cancer ([name]) 04/2020   Hypothyroidism    Thyroid disease      Past Surgical History:  Procedure Laterality Date   COLONOSCOPY WITH PROPOFOL N/A 11/21/2019   Procedure: COLONOSCOPY WITH PROPOFOL;  Surgeon: Lin Landsman, MD;  Location: [facility];  Service: Gastroenterology;  Laterality: N/A;   CYSTOSCOPY WITH STENT PLACEMENT Left 01/30/2020   Procedure: CYSTOSCOPY WITH STENT PLACEMENT;  Surgeon: Hollice Espy, MD;  Location: [facility];  Service: Urology;  Laterality: Left;   ESOPHAGOGASTRODUODENOSCOPY (EGD) WITH PROPOFOL N/A 11/21/2019   Procedure: ESOPHAGOGASTRODUODENOSCOPY (EGD) WITH PROPOFOL;  Surgeon: Lin Landsman, MD;  [facility];  Service: Gastroenterology;  Laterality: N/A;   IR RADIOLOGIST EVAL & MGMT  07/03/2020   IR RADIOLOGIST EVAL & MGMT  07/12/2020   PARTIAL COLECTOMY N/A 01/30/2020   Procedure: PARTIAL COLECTOMY Sigmoid;  Surgeon: [name], MD;  Location: [facility];  Service: General;  Laterality: N/A;   PORTACATH PLACEMENT Left 03/28/2020   Procedure: INSERTION PORT-A-CATH;  Surgeon: [name], MD;  Location: [facility];  Service: General;  Laterality: Left;   PORTACATH PLACEMENT N/A 08/29/2020   Procedure: INSERTION PORT-A-CATH, chemotherapy;  Surgeon: [name], MD;  Location: [facility];  Service: General;  Laterality: N/A;    Social History   Socioeconomic History   Marital status: Single    Spouse name: Not on file   Number of children: Not on file   Years  of education: Not on file   Highest education level: Not on file  Occupational History   Occupation: Factory  Tobacco Use   Smoking status: Never   Smokeless tobacco: Never  Vaping Use   Vaping Use: Never used  Substance and Sexual Activity   Alcohol use: Never   Drug use: Never   Sexual activity: Not on file  Other Topics Concern   Not on file  Social History Narrative   Lives at home with significant other   Social Determinants of Health   Financial Resource Strain: Not on file  Food Insecurity: Not on file  Transportation Needs: Not on file  Physical Activity: Not on file  Stress: Not on file  Social Connections: Not on file  Intimate Partner Violence: Not on file     Family History  Problem Relation Age of Onset   Diabetes Sister      Current Outpatient Medications:    docusate sodium (COLACE) 100 MG capsule, Take 1 capsule (100 mg total) by mouth daily., Disp: 30 capsule, Rfl: 1   gabapentin (NEURONTIN) 100 MG capsule, Take 1 capsule (100 mg total) by mouth at bedtime., Disp: 30 capsule, Rfl: 1   ibuprofen (ADVIL) 800 MG tablet, Take 1 tablet (800 mg total) by mouth every 8 (eight) hours as needed., Disp: 30 tablet, Rfl: 0   levothyroxine (SYNTHROID) 137 MCG tablet, Take 137 mcg by mouth daily before breakfast. , Disp: , Rfl:    lidocaine-prilocaine (EMLA) cream, Apply to affected area once, Disp: 30 g, Rfl: 3   magic mouthwash w/lidocaine SOLN, Take  5 mLs by mouth 4 (four) times daily as needed for mouth pain. Sig: Swish/Spit 5-10 ml four times a day as needed. Dispense 480 ml. 1RF, Disp: 480 mL, Rfl: 1   ondansetron (ZOFRAN) 8 MG tablet, Take 1 tablet (8 mg total) by mouth 2 (two) times daily as needed for refractory nausea / vomiting. Start on day 3 after chemotherapy., Disp: 30 tablet, Rfl: 1   polyethylene glycol (MIRALAX) 17 g packet, Take 17 g by mouth daily as needed., Disp: 14 each, Rfl: 0   B-D 3CC LUER-LOK SYR 25GX1" 25G X 1" 3 ML MISC, Use with B12  injection (Patient not taking: Reported on 02/18/2021), Disp: , Rfl:    cyanocobalamin (,VITAMIN B-12,) 1000 MCG/ML injection, Inject 1,000 mcg into the muscle every 30 (thirty) days. (Patient not taking: No sig reported), Disp: , Rfl:    hydrocortisone cream 0.5 %, Apply 1 application topically 2 (two) times daily. Until rash is healed. (Patient not taking: No sig reported), Disp: 30 g, Rfl: 0   nystatin (MYCOSTATIN/NYSTOP) powder, Apply 1 application topically 3 (three) times daily. (Patient not taking: Reported on 02/18/2021), Disp: 30 g, Rfl: 0   potassium chloride (KLOR-CON) 10 MEQ tablet, Take 1 tablet (10 mEq total) by mouth daily. (Patient not taking: No sig reported), Disp: 30 tablet, Rfl: 1   prochlorperazine (COMPAZINE) 10 MG tablet, Take 1 tablet (10 mg total) by mouth every 6 (six) hours as needed (Nausea or vomiting). (Patient not taking: No sig reported), Disp: 30 tablet, Rfl: 1 No current facility-administered medications for this visit.  Facility-Administered Medications Ordered in Other Visits:    fluorouracil (ADRUCIL) 4,400 mg in sodium chloride 0.9 % 62 mL chemo infusion, 2,400 mg/m2 (Treatment Plan Recorded), Intravenous, 1 day or 1 dose, Earlie Server, MD, 4,400 mg at 02/18/21 1350   heparin lock flush 100 unit/mL, 500 Units, Intravenous, Once, Earlie Server, MD   sodium chloride flush (NS) 0.9 % injection 10 mL, 10 mL, Intravenous, Once, Earlie Server, MD   Physical exam:  Vitals:   02/18/21 0947  BP: 116/72  Pulse: 71  Resp: 18  Temp: 98.3 F (36.8 C)  Weight: 191 lb 8 oz (86.9 kg)   Physical Exam Constitutional:      General: She is not in acute distress.    Appearance: She is obese. She is not diaphoretic.     Comments: Patient walks independently  HENT:     Head: Normocephalic and atraumatic.     Nose: Nose normal.     Mouth/Throat:     Pharynx: No oropharyngeal exudate.  Eyes:     General: No scleral icterus.    Pupils: Pupils are equal, round, and reactive to light.   Cardiovascular:     Rate and Rhythm: Normal rate and regular rhythm.     Heart sounds: No murmur heard. Pulmonary:     Effort: Pulmonary effort is normal. No respiratory distress.     Breath sounds: No rales.  Chest:     Chest wall: No tenderness.  Abdominal:     General: There is no distension.     Palpations: Abdomen is soft.     Tenderness: There is no abdominal tenderness.  Musculoskeletal:        General: Normal range of motion.     Cervical back: Normal range of motion and neck supple.  Skin:    General: Skin is warm and dry.     Findings: No erythema.     Comments: Right side  anterior chest wall + Mediport  Neurological:     Mental Status: She is alert and oriented to person, place, and time.     Cranial Nerves: No cranial nerve deficit.     Motor: No abnormal muscle tone.     Coordination: Coordination normal.  Psychiatric:        Mood and Affect: Affect normal.   Trace bilateral lower extremity edema   CMP Latest Ref Rng & Units 02/18/2021  Glucose 70 - 99 mg/dL 163(H)  BUN 6 - 20 mg/dL 7  Creatinine 0.44 - 1.00 mg/dL 0.45  Sodium 135 - 145 mmol/L 138  Potassium 3.5 - 5.1 mmol/L 3.4(L)  Chloride 98 - 111 mmol/L 106  CO2 22 - 32 mmol/L 24  Calcium 8.9 - 10.3 mg/dL 9.0  Total Protein 6.5 - 8.1 g/dL 7.1  Total Bilirubin 0.3 - 1.2 mg/dL 0.7  Alkaline Phos 38 - 126 U/L 134(H)  AST 15 - 41 U/L 39  ALT 0 - 44 U/L 19   CBC Latest Ref Rng & Units 02/18/2021  WBC 4.0 - 10.5 K/uL 2.9(L)  Hemoglobin 12.0 - 15.0 g/dL 11.8(L)  Hematocrit 36.0 - 46.0 % 34.9(L)  Platelets 150 - 400 K/uL 85(L)    RADIOGRAPHIC STUDIES: I have personally reviewed the radiological images as listed and agreed with the findings in the report. No results found.  Assessment and plan-  1. Malignant neoplasm of sigmoid colon ([place])   2. Encounter for antineoplastic chemotherapy   3. Chemotherapy-induced thrombocytopenia   4. Anemia due to antineoplastic chemotherapy   5. Neuropathy    Cancer Staging Malignant neoplasm of sigmoid colon [facility]) Staging form: Colon and Rectum, AJCC 8th Edition - Pathologic: Stage IIIB (pT3, pN1c, cM0) - Signed by Earlie Server, MD on 02/15/2020  #Stage IIIb colon cancer- 01/30/2020 pT3 pN1c, MMR intact  On adjuvant FOLFOX, bolus 5-FU omitted starting cycle 2.   Labs are reviewed and discussed with patient. Proceed with cycle 12 FOLFOX Repeat CT in July 2022  #Chemotherapy-induced thrombocytopenia.  Stable counts, 85000 #Hypokalemia, recommend patient to resume potassium 27mq daily for 2 weeks #Constipation,continue current regimen bowel regimen with Colace 100 mg twice daily.  Add MiraLAX daily as needed. #Grade 2 chemotherapy induced neuropathy. Continue gabapentin 1073mQHS.  Advised patient to contact me if neuropathy symptoms gets worse. #Morbid obesity, recommend exercise as tolerated.  Healthy diet and lifestyle modification Spanish interpretor was present for the entire encounter.  Supportive care measures are necessary for patient well-being and will be provided as necessary. We spent sufficient time to discuss many aspect of care, questions were answered to patient's satisfaction.   Follow-up CT chest abdomen pelvis with contrast in July 2022, lab-CBC, CMP, CEA MD after CT   ZhEarlie ServerMD, PhD Hematology Oncology CoMethod[facility] LPt AlPresentation Medical Centerager- [phone]/13/2022

## 2021-02-18 NOTE — Progress Notes (Signed)
Patient here for follow up. No new concerns voiced.  °

## 2021-02-18 NOTE — Addendum Note (Signed)
Addended by: Earlie Server on: 02/18/2021 07:59 PM   Modules accepted: Orders

## 2021-02-20 ENCOUNTER — Inpatient Hospital Stay: Payer: Self-pay

## 2021-02-20 ENCOUNTER — Other Ambulatory Visit: Payer: Self-pay

## 2021-02-20 VITALS — BP 130/79 | HR 71 | Temp 98.2°F | Resp 20

## 2021-02-20 DIAGNOSIS — C187 Malignant neoplasm of sigmoid colon: Secondary | ICD-10-CM

## 2021-02-20 MED ORDER — HEPARIN SOD (PORK) LOCK FLUSH 100 UNIT/ML IV SOLN
INTRAVENOUS | Status: AC
  Filled 2021-02-20: qty 5

## 2021-02-20 MED ORDER — SODIUM CHLORIDE 0.9% FLUSH
10.0000 mL | INTRAVENOUS | Status: DC | PRN
  Administered 2021-02-20: 10 mL
  Filled 2021-02-20: qty 10

## 2021-02-20 MED ORDER — HEPARIN SOD (PORK) LOCK FLUSH 100 UNIT/ML IV SOLN
500.0000 [IU] | Freq: Once | INTRAVENOUS | Status: AC | PRN
  Administered 2021-02-20: 500 [IU]
  Filled 2021-02-20: qty 5

## 2021-02-24 ENCOUNTER — Encounter: Payer: Self-pay | Admitting: Oncology

## 2021-03-15 ENCOUNTER — Encounter: Payer: Self-pay | Admitting: Oncology

## 2021-03-18 ENCOUNTER — Other Ambulatory Visit: Payer: Self-pay

## 2021-03-18 ENCOUNTER — Ambulatory Visit
Admission: RE | Admit: 2021-03-18 | Discharge: 2021-03-18 | Disposition: A | Discharge status: Home / Self Care | Payer: Self-pay | Source: Ambulatory Visit | Attending: Oncology | Admitting: Oncology

## 2021-03-18 ENCOUNTER — Inpatient Hospital Stay: Payer: Self-pay | Attending: Oncology

## 2021-03-18 DIAGNOSIS — T451X5A Adverse effect of antineoplastic and immunosuppressive drugs, initial encounter: Secondary | ICD-10-CM | POA: Insufficient documentation

## 2021-03-18 DIAGNOSIS — Z6839 Body mass index (BMI) 39.0-39.9, adult: Secondary | ICD-10-CM | POA: Insufficient documentation

## 2021-03-18 DIAGNOSIS — Z79899 Other long term (current) drug therapy: Secondary | ICD-10-CM | POA: Insufficient documentation

## 2021-03-18 DIAGNOSIS — D6481 Anemia due to antineoplastic chemotherapy: Secondary | ICD-10-CM | POA: Insufficient documentation

## 2021-03-18 DIAGNOSIS — G620 Drug-induced polyneuropathy: Secondary | ICD-10-CM | POA: Insufficient documentation

## 2021-03-18 DIAGNOSIS — C187 Malignant neoplasm of sigmoid colon: Secondary | ICD-10-CM | POA: Insufficient documentation

## 2021-03-18 DIAGNOSIS — D6959 Other secondary thrombocytopenia: Secondary | ICD-10-CM | POA: Insufficient documentation

## 2021-03-18 LAB — COMPREHENSIVE METABOLIC PANEL
ALT: 23 U/L (ref 0–44)
AST: 48 U/L — ABNORMAL HIGH (ref 15–41)
Albumin: 3.9 g/dL (ref 3.5–5.0)
Alkaline Phosphatase: 142 U/L — ABNORMAL HIGH (ref 38–126)
Anion gap: 7 (ref 5–15)
BUN: 7 mg/dL (ref 6–20)
CO2: 24 mmol/L (ref 22–32)
Calcium: 9.1 mg/dL (ref 8.9–10.3)
Chloride: 104 mmol/L (ref 98–111)
Creatinine, Ser: 0.38 mg/dL — ABNORMAL LOW (ref 0.44–1.00)
GFR, Estimated: >60 mL/min (ref >60)
Glucose, Bld: 102 mg/dL — ABNORMAL HIGH (ref 70–99)
Potassium: 3.7 mmol/L (ref 3.5–5.1)
Sodium: 135 mmol/L (ref 135–145)
Total Bilirubin: 0.7 mg/dL (ref 0.3–1.2)
Total Protein: 7.6 g/dL (ref 6.5–8.1)

## 2021-03-18 LAB — CBC WITH DIFFERENTIAL/PLATELET
Abs Immature Granulocytes: 0.01 10*3/uL (ref 0.00–0.07)
Basophils Absolute: 0 10*3/uL (ref 0.0–0.1)
Basophils Relative: 1 %
Eosinophils Absolute: 0.1 10*3/uL (ref 0.0–0.5)
Eosinophils Relative: 3 %
HCT: 36.6 % (ref 36.0–46.0)
Hemoglobin: 12.3 g/dL (ref 12.0–15.0)
Immature Granulocytes: 0 %
Lymphocytes Relative: 28 %
Lymphs Abs: 1.1 10*3/uL (ref 0.7–4.0)
MCH: 30.5 pg (ref 26.0–34.0)
MCHC: 33.6 g/dL (ref 30.0–36.0)
MCV: 90.8 fL (ref 80.0–100.0)
Monocytes Absolute: 0.5 10*3/uL (ref 0.1–1.0)
Monocytes Relative: 11 %
Neutro Abs: 2.3 10*3/uL (ref 1.7–7.7)
Neutrophils Relative %: 57 %
Platelets: 71 10*3/uL — ABNORMAL LOW (ref 150–400)
RBC: 4.03 MIL/uL (ref 3.87–5.11)
RDW: 15 % (ref 11.5–15.5)
WBC: 4 10*3/uL (ref 4.0–10.5)
nRBC: 0 % (ref 0.0–0.2)

## 2021-03-18 MED ORDER — HEPARIN SOD (PORK) LOCK FLUSH 100 UNIT/ML IV SOLN
500.0000 [IU] | Freq: Once | INTRAVENOUS | Status: AC
  Administered 2021-03-18: 500 [IU] via INTRAVENOUS
  Filled 2021-03-18: qty 5

## 2021-03-18 MED ORDER — IOHEXOL 300 MG/ML  SOLN
100.0000 mL | Freq: Once | INTRAMUSCULAR | Status: AC | PRN
  Administered 2021-03-18: 100 mL via INTRAVENOUS

## 2021-03-19 LAB — CEA: CEA: 4 ng/mL (ref 0.0–4.7)

## 2021-03-20 ENCOUNTER — Inpatient Hospital Stay: Payer: Self-pay

## 2021-03-20 ENCOUNTER — Other Ambulatory Visit: Payer: Self-pay

## 2021-03-20 ENCOUNTER — Inpatient Hospital Stay (HOSPITAL_BASED_OUTPATIENT_CLINIC_OR_DEPARTMENT_OTHER): Payer: Self-pay | Admitting: Oncology

## 2021-03-20 ENCOUNTER — Encounter: Payer: Self-pay | Admitting: Oncology

## 2021-03-20 VITALS — BP 105/78 | HR 68 | Temp 97.2°F | Resp 17 | Wt 188.0 lb

## 2021-03-20 DIAGNOSIS — Z5111 Encounter for antineoplastic chemotherapy: Secondary | ICD-10-CM

## 2021-03-20 DIAGNOSIS — C187 Malignant neoplasm of sigmoid colon: Secondary | ICD-10-CM

## 2021-03-20 DIAGNOSIS — D6481 Anemia due to antineoplastic chemotherapy: Secondary | ICD-10-CM

## 2021-03-20 DIAGNOSIS — T451X5A Adverse effect of antineoplastic and immunosuppressive drugs, initial encounter: Secondary | ICD-10-CM

## 2021-03-20 DIAGNOSIS — G629 Polyneuropathy, unspecified: Secondary | ICD-10-CM

## 2021-03-20 DIAGNOSIS — E669 Obesity, unspecified: Secondary | ICD-10-CM

## 2021-03-20 DIAGNOSIS — D6959 Other secondary thrombocytopenia: Secondary | ICD-10-CM

## 2021-03-20 LAB — CBC WITH DIFFERENTIAL/PLATELET
Abs Immature Granulocytes: 0.01 10*3/uL (ref 0.00–0.07)
Basophils Absolute: 0 10*3/uL (ref 0.0–0.1)
Basophils Relative: 1 %
Eosinophils Absolute: 0.1 10*3/uL (ref 0.0–0.5)
Eosinophils Relative: 3 %
HCT: 38 % (ref 36.0–46.0)
Hemoglobin: 12.6 g/dL (ref 12.0–15.0)
Immature Granulocytes: 0 %
Lymphocytes Relative: 30 %
Lymphs Abs: 1 10*3/uL (ref 0.7–4.0)
MCH: 30.5 pg (ref 26.0–34.0)
MCHC: 33.2 g/dL (ref 30.0–36.0)
MCV: 92 fL (ref 80.0–100.0)
Monocytes Absolute: 0.3 10*3/uL (ref 0.1–1.0)
Monocytes Relative: 10 %
Neutro Abs: 1.8 10*3/uL (ref 1.7–7.7)
Neutrophils Relative %: 56 %
Platelets: 68 10*3/uL — ABNORMAL LOW (ref 150–400)
RBC: 4.13 MIL/uL (ref 3.87–5.11)
RDW: 14.9 % (ref 11.5–15.5)
WBC: 3.2 10*3/uL — ABNORMAL LOW (ref 4.0–10.5)
nRBC: 0 % (ref 0.0–0.2)

## 2021-03-20 LAB — VITAMIN B12: Vitamin B-12: 412 pg/mL (ref 180–914)

## 2021-03-20 LAB — IMMATURE PLATELET FRACTION: Immature Platelet Fraction: 3.3 % (ref 1.2–8.6)

## 2021-03-20 LAB — TECHNOLOGIST SMEAR REVIEW
Plt Morphology: DECREASED
RBC MORPHOLOGY: NORMAL
WBC MORPHOLOGY: NORMAL

## 2021-03-20 LAB — RETIC PANEL
Immature Retic Fract: 15 % (ref 2.3–15.9)
RBC.: 4.17 MIL/uL (ref 3.87–5.11)
Retic Count, Absolute: 103 10*3/uL (ref 19.0–186.0)
Retic Ct Pct: 2.5 % (ref 0.4–3.1)
Reticulocyte Hemoglobin: 36.3 pg (ref >27.9)

## 2021-03-20 LAB — FOLATE: Folate: 35 ng/mL (ref >5.9)

## 2021-03-20 NOTE — Progress Notes (Signed)
.   Hematology/Oncology Progress Note [facility] Telephone:([phone] [phone]  Patient Care Team: [name], MD as PCP - General (Family Medicine) [name], RN as Registered Nurse [name], RN as Registered Nurse   Name of the patient: Debra [name]  [id number]  April 09, 1966  Date of visit: 03/20/21  PERTINENT [place] Debra [name] is a 55 y.o.afemale who has above oncology history reviewed by me today presented for follow up visit for management of Stage IIIB Colon cancer seen by me on 11/21/2019 during her hospitalization. Patient has a history of Covid positivity. Severe iron deficiency status post IV Venofer treatment. Colonoscopy 11/21/2019 showed a malignant appearing severe stenosis found in the sigmoid colon at 25 cm proximal to the anus and was nontransversed.  Biopsy was taken.  Surgical pathology is pending  Nonbleeding external hemorrhoids Biopsy was not diagnostic 01/03/2020 virtual colonoscopy showed long segment area of wall thickening and annular constriction within the sigmoid colon in the area of previous concern most compatible with annular malignancy.  Remainder of the colon evaluation is somewhat limited due to large amount of retained stool and incomplete distention proximal to the annular constricting lesion. 01/30/2020 Patient underwent left hemicolectomy by Dr. Celine Ahr and a biopsy showed invasive adenocarcinoma, poorly differentiated.  37 lymph nodes negative for malignancy.  Grade 3, all margins are uninvolved.  Perineural invasion present.  Tumor deposit present. pT3 pN1c cM0 Checks x-ray did not show any lung mass. #Postop, patient developed postop ileus and Debra [name] required TPN, PICC and NG tube placement.  Postoperation day 9 Debra [name] continued to have increased leukocytosis and was subsequently found to have a midline wound infection to the inferior portion of the wound.  Patient was treated with  antibiotics.  07/03/2020 CT abdomen pelvis w contrast showed well positioned drainage catheter with apparent resolution of the intra-abdominal abscess. 07/10/2020 seen by [facility] and patient had drainage catheter removed by IR on 07/12/2020  09/03/2020-01/14/2021 Adjuvant chemotherapy FOLFOX x 12, bolus 5-FU omitted starting cycle 2.  INTERVAL HISTORY Debra [name] is a 55 y.o. female who has above history reviewed by me today presents for follow up visit for management of stage IIIb colon cancer Debra [name] reports chronic numbness of her hands and feet. Not worse.  Recently Debra [name] had one episode of " red color" in her stool. Debra [name] attributed to eating red color fruit.  No nausea vomiting, diarrhea.    Review of systems- Review of Systems  Constitutional:  Positive for fatigue. Negative for appetite change, chills, fever and unexpected weight change.  HENT:   Negative for hearing loss and voice change.   Eyes:  Negative for eye problems.  Respiratory:  Negative for chest tightness, cough and shortness of breath.   Cardiovascular:  Negative for chest pain.  Gastrointestinal:  Negative for abdominal distention, abdominal pain, blood in stool and constipation.  Endocrine: Negative for hot flashes.  Genitourinary:  Negative for difficulty urinating, dysuria and frequency.   Musculoskeletal:  Negative for arthralgias.  Skin:  Negative for itching and wound.  Neurological:  Positive for numbness. Negative for extremity weakness, headaches and light-headedness.  Hematological:  Negative for adenopathy.  Psychiatric/Behavioral:  Negative for confusion.    No Known Allergies  Patient Active Problem List   Diagnosis Date Noted   Encounter for antineoplastic chemotherapy 09/03/2020   Encounter for central line placement    Loss of weight 02/15/2020   Goals of care, counseling/discussion 02/15/2020   Malignant neoplasm of  sigmoid colon ([place]) 02/15/2020   Pressure injury of skin 02/03/2020   Bowel  obstruction ([place]) 01/29/2020   Iron deficiency anemia due to chronic blood loss 11/22/2019   COVID-19 virus detected 11/20/2019   Symptomatic anemia 11/20/2019   Hypothyroidism 11/20/2019   Iron deficiency anemia 11/20/2019   GIB (gastrointestinal bleeding) 11/20/2019     Past Medical History:  Diagnosis Date   Anemia    Colon cancer ([place]) 04/2020   Hypothyroidism    Thyroid disease      Past Surgical History:  Procedure Laterality Date   COLONOSCOPY WITH PROPOFOL N/A 11/21/2019   Procedure: COLONOSCOPY WITH PROPOFOL;  Surgeon: Lin Landsman, MD;  Location: [facility];  Service: Gastroenterology;  Laterality: N/A;   CYSTOSCOPY WITH STENT PLACEMENT Left 01/30/2020   Procedure: CYSTOSCOPY WITH STENT PLACEMENT;  Surgeon: Hollice Espy, MD;  Location: [facility];  Service: Urology;  Laterality: Left;   ESOPHAGOGASTRODUODENOSCOPY (EGD) WITH PROPOFOL N/A 11/21/2019   Procedure: ESOPHAGOGASTRODUODENOSCOPY (EGD) WITH PROPOFOL;  Surgeon: Lin Landsman, MD;  Location: [facility];  Service: Gastroenterology;  Laterality: N/A;   IR RADIOLOGIST EVAL & MGMT  07/03/2020   IR RADIOLOGIST EVAL & MGMT  07/12/2020   PARTIAL COLECTOMY N/A 01/30/2020   Procedure: PARTIAL COLECTOMY Sigmoid;  Surgeon: [name], MD;  Location: [facility];  Service: General;  Laterality: N/A;   PORTACATH PLACEMENT Left 03/28/2020   Procedure: INSERTION PORT-A-CATH;  Surgeon: [name], MD;  Location: [facility];  Service: General;  Laterality: Left;   PORTACATH PLACEMENT N/A 08/29/2020   Procedure: INSERTION PORT-A-CATH, chemotherapy;  Surgeon: [name], MD;  Location: [facility];  Service: General;  Laterality: N/A;    Social History   Socioeconomic History   Marital status: Single    Spouse name: Not on file   Number of children: Not on file   Years of education: Not on file   Highest education level: Not on file  Occupational History   Occupation: Factory  Tobacco Use    Smoking status: Never   Smokeless tobacco: Never  Vaping Use   Vaping Use: Never used  Substance and Sexual Activity   Alcohol use: Never   Drug use: Never   Sexual activity: Not on file  Other Topics Concern   Not on file  Social History Narrative   Lives at home with significant other   Social Determinants of Health   Financial Resource Strain: Not on file  Food Insecurity: Not on file  Transportation Needs: Not on file  Physical Activity: Not on file  Stress: Not on file  Social Connections: Not on file  Intimate Partner Violence: Not on file     Family History  Problem Relation Age of Onset   Diabetes Sister      Current Outpatient Medications:    docusate sodium (COLACE) 100 MG capsule, Take 1 capsule (100 mg total) by mouth daily., Disp: 30 capsule, Rfl: 1   gabapentin (NEURONTIN) 100 MG capsule, Take 1 capsule (100 mg total) by mouth at bedtime., Disp: 30 capsule, Rfl: 1   ibuprofen (ADVIL) 800 MG tablet, Take 1 tablet (800 mg total) by mouth every 8 (eight) hours as needed., Disp: 30 tablet, Rfl: 0   levothyroxine (SYNTHROID) 137 MCG tablet, Take 137 mcg by mouth daily before breakfast. , Disp: , Rfl:    lidocaine-prilocaine (EMLA) cream, Apply to affected area once, Disp: 30 g, Rfl: 3   magic mouthwash w/lidocaine SOLN, Take 5 mLs by mouth 4 (four) times daily as needed  for mouth pain. Sig: Swish/Spit 5-10 ml four times a day as needed. Dispense 480 ml. 1RF, Disp: 480 mL, Rfl: 1   ondansetron (ZOFRAN) 8 MG tablet, Take 1 tablet (8 mg total) by mouth 2 (two) times daily as needed for refractory nausea / vomiting. Start on day 3 after chemotherapy., Disp: 30 tablet, Rfl: 1   polyethylene glycol (MIRALAX) 17 g packet, Take 17 g by mouth daily as needed., Disp: 14 each, Rfl: 0   prochlorperazine (COMPAZINE) 10 MG tablet, Take 1 tablet (10 mg total) by mouth every 6 (six) hours as needed (Nausea or vomiting)., Disp: 30 tablet, Rfl: 1   B-D 3CC LUER-LOK SYR 25GX1" 25G X 1"  3 ML MISC, Use with B12 injection (Patient not taking: No sig reported), Disp: , Rfl:    cyanocobalamin (,VITAMIN B-12,) 1000 MCG/ML injection, Inject 1,000 mcg into the muscle every 30 (thirty) days. (Patient not taking: No sig reported), Disp: , Rfl:    hydrocortisone cream 0.5 %, Apply 1 application topically 2 (two) times daily. Until rash is healed. (Patient not taking: No sig reported), Disp: 30 g, Rfl: 0   nystatin (MYCOSTATIN/NYSTOP) powder, Apply 1 application topically 3 (three) times daily. (Patient not taking: No sig reported), Disp: 30 g, Rfl: 0   Physical exam:  Vitals:   03/20/21 1053  BP: 105/78  Pulse: 68  Resp: 17  Temp: (!) 97.2 F (36.2 C)  TempSrc: Tympanic  SpO2: 98%  Weight: 188 lb (85.3 kg)   Physical Exam Constitutional:      General: Debra [name] is not in acute distress.    Appearance: Debra [name] is obese. Debra [name] is not diaphoretic.     Comments: Patient walks independently  HENT:     Head: Normocephalic and atraumatic.     Nose: Nose normal.     Mouth/Throat:     Pharynx: No oropharyngeal exudate.  Eyes:     General: No scleral icterus.    Pupils: Pupils are equal, round, and reactive to light.  Cardiovascular:     Rate and Rhythm: Normal rate and regular rhythm.     Heart sounds: No murmur heard. Pulmonary:     Effort: Pulmonary effort is normal. No respiratory distress.     Breath sounds: No rales.  Chest:     Chest wall: No tenderness.  Abdominal:     General: There is no distension.     Palpations: Abdomen is soft.     Tenderness: There is no abdominal tenderness.  Musculoskeletal:        General: Normal range of motion.     Cervical back: Normal range of motion and neck supple.  Skin:    General: Skin is warm and dry.     Findings: No erythema.     Comments: Right side anterior chest wall + Mediport  Neurological:     Mental Status: Debra [name] is alert and oriented to person, place, and time.     Cranial Nerves: No cranial nerve deficit.     Motor: No  abnormal muscle tone.     Coordination: Coordination normal.  Psychiatric:        Mood and Affect: Affect normal.   Trace bilateral lower extremity edema   CMP Latest Ref Rng & Units 03/18/2021  Glucose 70 - 99 mg/dL 102(H)  BUN 6 - 20 mg/dL 7  Creatinine 0.44 - 1.00 mg/dL 0.38(L)  Sodium 135 - 145 mmol/L 135  Potassium 3.5 - 5.1 mmol/L 3.7  Chloride 98 - 111 mmol/L  104  CO2 22 - 32 mmol/L 24  Calcium 8.9 - 10.3 mg/dL 9.1  Total Protein 6.5 - 8.1 g/dL 7.6  Total Bilirubin 0.3 - 1.2 mg/dL 0.7  Alkaline Phos 38 - 126 U/L 142(H)  AST 15 - 41 U/L 48(H)  ALT 0 - 44 U/L 23   CBC Latest Ref Rng & Units 03/20/2021  WBC 4.0 - 10.5 K/uL 3.2(L)  Hemoglobin 12.0 - 15.0 g/dL 12.6  Hematocrit 36.0 - 46.0 % 38.0  Platelets 150 - 400 K/uL 68(L)    RADIOGRAPHIC STUDIES: I have personally reviewed the radiological images as listed and agreed with the findings in the report. CT CHEST ABDOMEN PELVIS W CONTRAST  Result Date: 03/19/2021 CLINICAL DATA:  Colon cancer.  Restaging. EXAM: CT CHEST, ABDOMEN, AND PELVIS WITH CONTRAST TECHNIQUE: Multidetector CT imaging of the chest, abdomen and pelvis was performed following the standard protocol during bolus administration of intravenous contrast. CONTRAST:  18m OMNIPAQUE IOHEXOL 300 MG/ML  SOLN COMPARISON:  08/27/2020 FINDINGS: CT CHEST FINDINGS Cardiovascular: The heart size is normal. No substantial pericardial effusion. No thoracic aortic aneurysm. Right Port-A-Cath tip is positioned at the SVC/RA junction. Mediastinum/Nodes: No mediastinal lymphadenopathy. There is no hilar lymphadenopathy. Contrast material in the distal esophagus likely related to reflux. There is no axillary lymphadenopathy. Lungs/Pleura: No suspicious pulmonary nodule or mass. Subsegmental atelectasis noted left lower lobe. No focal airspace consolidation. No pleural effusion. Musculoskeletal: No worrisome lytic or sclerotic osseous abnormality. CT ABDOMEN PELVIS FINDINGS  Hepatobiliary: No suspicious focal abnormality within the liver parenchyma. There is no evidence for gallstones, gallbladder wall thickening, or pericholecystic fluid. No intrahepatic or extrahepatic biliary dilation. Pancreas: No focal mass lesion. No dilatation of the main duct. No intraparenchymal cyst. No peripancreatic edema. Spleen: No splenomegaly. No focal mass lesion. Adrenals/Urinary Tract: No adrenal nodule or mass. Kidneys unremarkable. No evidence for hydroureter. The urinary bladder appears normal for the degree of distention. Stomach/Bowel: Stomach is unremarkable. No gastric wall thickening. No evidence of outlet obstruction. Duodenum is normally positioned as is the ligament of Treitz. No small bowel wall thickening. No small bowel dilatation. The terminal ileum is normal. The appendix is normal. Status post left hemicolectomy. Vascular/Lymphatic: No abdominal aortic aneurysm. No abdominal aortic atherosclerotic calcification. There is no gastrohepatic or hepatoduodenal ligament lymphadenopathy. No retroperitoneal or mesenteric lymphadenopathy. No pelvic sidewall lymphadenopathy. Reproductive: The uterus is unremarkable.  There is no adnexal mass. Other: No intraperitoneal free fluid. Musculoskeletal: No worrisome lytic or sclerotic osseous abnormality. Small fat containing right paraumbilical hernia noted. IMPRESSION: 1. No evidence for metastatic disease in the chest, abdomen, or pelvis. 2. Status post left hemicolectomy. 3. Contrast material in the distal esophagus likely related to reflux. 4. Small fat containing right paraumbilical hernia. Electronically Signed   By: EMisty StanleyM.D.   On: 03/19/2021 15:13    Assessment and plan-  1. Malignant neoplasm of sigmoid colon ([place]   2. Encounter for antineoplastic chemotherapy   3. Chemotherapy-induced thrombocytopenia   4. Anemia due to antineoplastic chemotherapy   5. Neuropathy   6. Obesity (BMI 35.0-39.9 without comorbidity)   Cancer  Staging Malignant neoplasm of sigmoid colon ([place] Staging form: Colon and Rectum, AJCC 8th Edition - Pathologic: Stage IIIB (pT3, pN1c, cM0) - Signed by YEarlie Server MD on 02/15/2020  #Stage IIIb colon cancer- 01/30/2020 pT3 pN1c, MMR intact  S/p adjuvant FOLFOX x 12, bolus 5-FU omitted starting cycle 2.  Labs are reviewed and discussed with patient. CEA is stable.  03/18/2021 CT showed no  evidence of metastatic disease. Possible reflux. Parumbilical hernia.  Continue H&P/labs every 3 months, and CT scan every 6 months for 2 years, followed by annual CT up to year 5.  Debra [name] needs repeat colonoscopy, refer her to Dr.Vanga  #Chemotherapy-induced thrombocytopenia.   Her platelet count does not recover after stop of chemotherapy.  Repeat CBC, tech smear, B12, folate. Immature platelet count.   #Hypokalemia, resolved.   #Grade 2 chemotherapy induced neuropathy. Continue gabapentin 135m QHS.    #Morbid obesity, recommend exercise as tolerated.  Healthy diet and lifestyle modification  Spanish interpretor was present for the entire encounter.  Supportive care measures are necessary for patient well-being and will be provided as necessary. We spent sufficient time to discuss many aspect of care, questions were answered to patient's satisfaction.   Follow-up CT chest abdomen pelvis with contrast in July 2022, lab-CBC, CMP, CEA MD after CT   ZEarlie Server MD, PhD Hematology Oncology [facility]at ASansum ClinicPager- [phone]/13/2022

## 2021-03-20 NOTE — Progress Notes (Signed)
Patient here for oncology follow-up appointment, expresses concerns of Numbness, fatigue, generalized pain

## 2021-04-01 ENCOUNTER — Encounter: Payer: Self-pay | Admitting: *Deleted

## 2021-06-17 ENCOUNTER — Encounter: Payer: Self-pay | Admitting: General Surgery

## 2021-06-19 ENCOUNTER — Other Ambulatory Visit: Payer: Self-pay

## 2021-06-19 DIAGNOSIS — C187 Malignant neoplasm of sigmoid colon: Secondary | ICD-10-CM

## 2021-06-20 ENCOUNTER — Other Ambulatory Visit: Payer: Self-pay

## 2021-06-20 ENCOUNTER — Inpatient Hospital Stay: Payer: Self-pay | Attending: Oncology

## 2021-06-20 DIAGNOSIS — G629 Polyneuropathy, unspecified: Secondary | ICD-10-CM | POA: Insufficient documentation

## 2021-06-20 DIAGNOSIS — E669 Obesity, unspecified: Secondary | ICD-10-CM | POA: Insufficient documentation

## 2021-06-20 DIAGNOSIS — C187 Malignant neoplasm of sigmoid colon: Secondary | ICD-10-CM | POA: Insufficient documentation

## 2021-06-20 DIAGNOSIS — D6959 Other secondary thrombocytopenia: Secondary | ICD-10-CM | POA: Insufficient documentation

## 2021-06-20 DIAGNOSIS — Z6835 Body mass index (BMI) 35.0-35.9, adult: Secondary | ICD-10-CM | POA: Insufficient documentation

## 2021-06-20 LAB — CBC WITH DIFFERENTIAL/PLATELET
Abs Immature Granulocytes: 0.01 10*3/uL (ref 0.00–0.07)
Basophils Absolute: 0 10*3/uL (ref 0.0–0.1)
Basophils Relative: 0 %
Eosinophils Absolute: 0.2 10*3/uL (ref 0.0–0.5)
Eosinophils Relative: 4 %
HCT: 37.8 % (ref 36.0–46.0)
Hemoglobin: 12.8 g/dL (ref 12.0–15.0)
Immature Granulocytes: 0 %
Lymphocytes Relative: 34 %
Lymphs Abs: 1.2 10*3/uL (ref 0.7–4.0)
MCH: 30 pg (ref 26.0–34.0)
MCHC: 33.9 g/dL (ref 30.0–36.0)
MCV: 88.7 fL (ref 80.0–100.0)
Monocytes Absolute: 0.3 10*3/uL (ref 0.1–1.0)
Monocytes Relative: 9 %
Neutro Abs: 1.8 10*3/uL (ref 1.7–7.7)
Neutrophils Relative %: 53 %
Platelets: 72 10*3/uL — ABNORMAL LOW (ref 150–400)
RBC: 4.26 MIL/uL (ref 3.87–5.11)
RDW: 13.2 % (ref 11.5–15.5)
WBC: 3.4 10*3/uL — ABNORMAL LOW (ref 4.0–10.5)
nRBC: 0 % (ref 0.0–0.2)

## 2021-06-20 LAB — COMPREHENSIVE METABOLIC PANEL
ALT: 23 U/L (ref 0–44)
AST: 38 U/L (ref 15–41)
Albumin: 4.2 g/dL (ref 3.5–5.0)
Alkaline Phosphatase: 136 U/L — ABNORMAL HIGH (ref 38–126)
Anion gap: 4 — ABNORMAL LOW (ref 5–15)
BUN: 10 mg/dL (ref 6–20)
CO2: 28 mmol/L (ref 22–32)
Calcium: 9.1 mg/dL (ref 8.9–10.3)
Chloride: 106 mmol/L (ref 98–111)
Creatinine, Ser: 0.4 mg/dL — ABNORMAL LOW (ref 0.44–1.00)
GFR, Estimated: >60 mL/min (ref >60)
Glucose, Bld: 104 mg/dL — ABNORMAL HIGH (ref 70–99)
Potassium: 4.2 mmol/L (ref 3.5–5.1)
Sodium: 138 mmol/L (ref 135–145)
Total Bilirubin: 0.7 mg/dL (ref 0.3–1.2)
Total Protein: 7.8 g/dL (ref 6.5–8.1)

## 2021-06-21 LAB — CEA: CEA: 3.3 ng/mL (ref 0.0–4.7)

## 2021-06-24 ENCOUNTER — Other Ambulatory Visit: Payer: Self-pay

## 2021-06-24 ENCOUNTER — Inpatient Hospital Stay (HOSPITAL_BASED_OUTPATIENT_CLINIC_OR_DEPARTMENT_OTHER): Payer: Self-pay | Admitting: Oncology

## 2021-06-24 ENCOUNTER — Encounter: Payer: Self-pay | Admitting: Oncology

## 2021-06-24 VITALS — BP 137/79 | HR 61 | Temp 97.3°F | Resp 18 | Wt 189.2 lb

## 2021-06-24 DIAGNOSIS — C187 Malignant neoplasm of sigmoid colon: Secondary | ICD-10-CM

## 2021-06-24 DIAGNOSIS — Z95828 Presence of other vascular implants and grafts: Secondary | ICD-10-CM

## 2021-06-24 DIAGNOSIS — D6959 Other secondary thrombocytopenia: Secondary | ICD-10-CM

## 2021-06-24 DIAGNOSIS — E669 Obesity, unspecified: Secondary | ICD-10-CM

## 2021-06-24 DIAGNOSIS — T451X5A Adverse effect of antineoplastic and immunosuppressive drugs, initial encounter: Secondary | ICD-10-CM

## 2021-06-24 DIAGNOSIS — G629 Polyneuropathy, unspecified: Secondary | ICD-10-CM

## 2021-06-24 MED ORDER — GABAPENTIN 100 MG PO CAPS: 100.0000 mg | ORAL_CAPSULE | Freq: Every day | ORAL | 1 refills | Status: DC

## 2021-06-24 NOTE — Progress Notes (Signed)
.   Hematology/Oncology Progress Note [facility] [phone] [phone]  Patient Care Team: [name], MD as PCP - General (Family Medicine) [name], RN as Registered Nurse [name], RN as Registered Nurse   Name of the patient: Debra [name]  [id number]  08/29/1966  Date of visit: 06/24/21  PERTINENT [place] Davianna Deutschman is a 55 y.o.afemale who has above oncology history reviewed by me today presented for follow up visit for management of Stage IIIB Colon cancer seen by me on 11/21/2019 during her hospitalization. Patient has a history of Covid positivity. Severe iron deficiency status post IV Venofer treatment. Colonoscopy 11/21/2019 showed a malignant appearing severe stenosis found in the sigmoid colon at 25 cm proximal to the anus and was nontransversed.  Biopsy was taken.  Surgical pathology is pending  Nonbleeding external hemorrhoids Biopsy was not diagnostic 01/03/2020 virtual colonoscopy showed long segment area of wall thickening and annular constriction within the sigmoid colon in the area of previous concern most compatible with annular malignancy.  Remainder of the colon evaluation is somewhat limited due to large amount of retained stool and incomplete distention proximal to the annular constricting lesion. 01/30/2020 Patient underwent left hemicolectomy by Dr. Celine Ahr and a biopsy showed invasive adenocarcinoma, poorly differentiated.  37 lymph nodes negative for malignancy.  Grade 3, all margins are uninvolved.  Perineural invasion present.  Tumor deposit present. pT3 pN1c cM0 Checks x-ray did not show any lung mass. #Postop, patient developed postop ileus and she required TPN, PICC and NG tube placement.  Postoperation day 9 she continued to have increased leukocytosis and was subsequently found to have a midline wound infection to the inferior portion of the wound.  Patient was treated with  antibiotics.  07/03/2020 CT abdomen pelvis w contrast showed well positioned drainage catheter with apparent resolution of the intra-abdominal abscess. 07/10/2020 seen by [facility] and patient had drainage catheter removed by IR on 07/12/2020  09/03/2020-01/14/2021 Adjuvant chemotherapy FOLFOX x 12, bolus 5-FU omitted starting cycle 2.  INTERVAL HISTORY Debra [name] is a 55 y.o. female who has above history reviewed by me today presents for follow up visit for management of stage IIIb colon cancer She reports chronic numbness of her hands and feet. Not worse.  She is currently not taking gabapentin. Denies any black stool or bloody stool.  No nausea vomiting diarrhea. Sometimes she is constipated .  Review of systems- Review of Systems  Constitutional:  Positive for fatigue. Negative for appetite change, chills, fever and unexpected weight change.  HENT:   Negative for hearing loss and voice change.   Eyes:  Negative for eye problems.  Respiratory:  Negative for chest tightness, cough and shortness of breath.   Cardiovascular:  Negative for chest pain.  Gastrointestinal:  Negative for abdominal distention, abdominal pain, blood in stool and constipation.  Endocrine: Negative for hot flashes.  Genitourinary:  Negative for difficulty urinating, dysuria and frequency.   Musculoskeletal:  Negative for arthralgias.  Skin:  Negative for itching and wound.  Neurological:  Positive for numbness. Negative for extremity weakness, headaches and light-headedness.  Hematological:  Negative for adenopathy.  Psychiatric/Behavioral:  Negative for confusion.    No Known Allergies  Patient Active Problem List   Diagnosis Date Noted   Encounter for antineoplastic chemotherapy 09/03/2020   Encounter for central line placement    Loss of weight 02/15/2020   Goals of care, counseling/discussion 02/15/2020   Malignant neoplasm of sigmoid colon ([place])  02/15/2020   Pressure injury of skin 02/03/2020    Bowel obstruction ([place]) 01/29/2020   Iron deficiency anemia due to chronic blood loss 11/22/2019   COVID-19 virus detected 11/20/2019   Symptomatic anemia 11/20/2019   Hypothyroidism 11/20/2019   Iron deficiency anemia 11/20/2019   GIB (gastrointestinal bleeding) 11/20/2019     Past Medical History:  Diagnosis Date   Anemia    Colon cancer ([place]) 04/2020   Hypothyroidism    Thyroid disease      Past Surgical History:  Procedure Laterality Date   COLONOSCOPY WITH PROPOFOL N/A 11/21/2019   Procedure: COLONOSCOPY WITH PROPOFOL;  Surgeon: Lin Landsman, MD;  Location: [place];  Service: Gastroenterology;  Laterality: N/A;   CYSTOSCOPY WITH STENT PLACEMENT Left 01/30/2020   Procedure: CYSTOSCOPY WITH STENT PLACEMENT;  Surgeon: Hollice Espy, MD;  Location: [facility];  Service: Urology;  Laterality: Left;   ESOPHAGOGASTRODUODENOSCOPY (EGD) WITH PROPOFOL N/A 11/21/2019   Procedure: ESOPHAGOGASTRODUODENOSCOPY (EGD) WITH PROPOFOL;  Surgeon: Lin Landsman, MD;  Location[facility] END[facility]COPY;  Service: Gastroenterology;  Laterality: N/A;   IR RADIOLOGIST EVAL & MGMT  07/03/2020   IR RADIOLOGIST EVAL & MGMT  07/12/2020   PARTIAL COLECTOMY N/A 01/30/2020   Procedure: PARTIAL COLECTOMY Sigmoid;  Surgeon: [name], MD;  Location: [facility];  Service: General;  Laterality: N/A;   PORTACATH PLACEMENT Left 03/28/2020   Procedure: INSERTION PORT-A-CATH;  Surgeon: [name], MD;  Location: [facility];  Service: General;  Laterality: Left;   PORTACATH PLACEMENT N/A 08/29/2020   Procedure: INSERTION PORT-A-CATH, chemotherapy;  Surgeon: [name], MD;  Location: [facility];  Service: General;  Laterality: N/A;    Social History   Socioeconomic History   Marital status: Single    Spouse name: Not on file   Number of children: Not on file   Years of education: Not on file   Highest education level: Not on file  Occupational History   Occupation: Factory  Tobacco Use    Smoking status: Never   Smokeless tobacco: Never  Vaping Use   Vaping Use: Never used  Substance and Sexual Activity   Alcohol use: Never   Drug use: Never   Sexual activity: Not on file  Other Topics Concern   Not on file  Social History Narrative   Lives at home with significant other   Social Determinants of Health   Financial Resource Strain: Not on file  Food Insecurity: Not on file  Transportation Needs: Not on file  Physical Activity: Not on file  Stress: Not on file  Social Connections: Not on file  Intimate Partner Violence: Not on file     Family History  Problem Relation Age of Onset   Diabetes Sister      Current Outpatient Medications:    docusate sodium (COLACE) 100 MG capsule, Take 1 capsule (100 mg total) by mouth daily., Disp: 30 capsule, Rfl: 1   levothyroxine (SYNTHROID) 137 MCG tablet, Take 137 mcg by mouth daily before breakfast. , Disp: , Rfl:    lidocaine-prilocaine (EMLA) cream, Apply to affected area once, Disp: 30 g, Rfl: 3   ondansetron (ZOFRAN) 8 MG tablet, Take 1 tablet (8 mg total) by mouth 2 (two) times daily as needed for refractory nausea / vomiting. Start on day 3 after chemotherapy., Disp: 30 tablet, Rfl: 1   polyethylene glycol (MIRALAX) 17 g packet, Take 17 g by mouth daily as needed., Disp: 14 each, Rfl: 0   prochlorperazine (COMPAZINE) 10 MG tablet, Take 1 tablet (  10 mg total) by mouth every 6 (six) hours as needed (Nausea or vomiting)., Disp: 30 tablet, Rfl: 1   B-D 3CC LUER-LOK SYR 25GX1" 25G X 1" 3 ML MISC, Use with B12 injection (Patient not taking: No sig reported), Disp: , Rfl:    cyanocobalamin (,VITAMIN B-12,) 1000 MCG/ML injection, Inject 1,000 mcg into the muscle every 30 (thirty) days. (Patient not taking: No sig reported), Disp: , Rfl:    gabapentin (NEURONTIN) 100 MG capsule, Take 1 capsule (100 mg total) by mouth at bedtime., Disp: 90 capsule, Rfl: 1   hydrocortisone cream 0.5 %, Apply 1 application topically 2 (two)  times daily. Until rash is healed. (Patient not taking: No sig reported), Disp: 30 g, Rfl: 0   ibuprofen (ADVIL) 800 MG tablet, Take 1 tablet (800 mg total) by mouth every 8 (eight) hours as needed. (Patient not taking: Reported on 06/24/2021), Disp: 30 tablet, Rfl: 0   magic mouthwash w/lidocaine SOLN, Take 5 mLs by mouth 4 (four) times daily as needed for mouth pain. Sig: Swish/Spit 5-10 ml four times a day as needed. Dispense 480 ml. 1RF (Patient not taking: Reported on 06/24/2021), Disp: 480 mL, Rfl: 1   nystatin (MYCOSTATIN/NYSTOP) powder, Apply 1 application topically 3 (three) times daily. (Patient not taking: No sig reported), Disp: 30 g, Rfl: 0   Physical exam:  Vitals:   06/24/21 1017  BP: 137/79  Pulse: 61  Resp: 18  Temp: (!) 97.3 F (36.3 C)  Weight: 189 lb 3.2 oz (85.8 kg)   Physical Exam Constitutional:      General: She is not in acute distress.    Appearance: She is obese. She is not diaphoretic.     Comments: Patient walks independently  HENT:     Head: Normocephalic and atraumatic.     Nose: Nose normal.     Mouth/Throat:     Pharynx: No oropharyngeal exudate.  Eyes:     General: No scleral icterus.    Pupils: Pupils are equal, round, and reactive to light.  Cardiovascular:     Rate and Rhythm: Normal rate and regular rhythm.     Heart sounds: No murmur heard. Pulmonary:     Effort: Pulmonary effort is normal. No respiratory distress.     Breath sounds: No rales.  Chest:     Chest wall: No tenderness.  Abdominal:     General: There is no distension.     Palpations: Abdomen is soft.     Tenderness: There is no abdominal tenderness.  Musculoskeletal:        General: Normal range of motion.     Cervical back: Normal range of motion and neck supple.  Skin:    General: Skin is warm and dry.     Findings: No erythema.     Comments: Right side anterior chest wall + Mediport vitiligo  Neurological:     Mental Status: She is alert and oriented to person,  place, and time.     Cranial Nerves: No cranial nerve deficit.     Motor: No abnormal muscle tone.     Coordination: Coordination normal.  Psychiatric:        Mood and Affect: Affect normal.   Trace bilateral lower extremity edema   CMP Latest Ref Rng & Units 06/20/2021  Glucose 70 - 99 mg/dL 104(H)  BUN 6 - 20 mg/dL 10  Creatinine 0.44 - 1.00 mg/dL 0.40(L)  Sodium 135 - 145 mmol/L 138  Potassium 3.5 - 5.1 mmol/L 4.2  Chloride 98 - 111 mmol/L 106  CO2 22 - 32 mmol/L 28  Calcium 8.9 - 10.3 mg/dL 9.1  Total Protein 6.5 - 8.1 g/dL 7.8  Total Bilirubin 0.3 - 1.2 mg/dL 0.7  Alkaline Phos 38 - 126 U/L 136(H)  AST 15 - 41 U/L 38  ALT 0 - 44 U/L 23   CBC Latest Ref Rng & Units 06/20/2021  WBC 4.0 - 10.5 K/uL 3.4(L)  Hemoglobin 12.0 - 15.0 g/dL 12.8  Hematocrit 36.0 - 46.0 % 37.8  Platelets 150 - 400 K/uL 72(L)    RADIOGRAPHIC STUDIES: I have personally reviewed the radiological images as listed and agreed with the findings in the report. No results found.  Assessment and plan-  1. Malignant neoplasm of sigmoid colon ([place])   2. Neuropathy   3. Chemotherapy-induced thrombocytopenia   4. Obesity (BMI 35.0-39.9 without comorbidity)   5. Port-A-Cath in place   Cancer Staging Malignant neoplasm of sigmoid colon [facility]) Staging form: Colon and Rectum, AJCC 8th Edition - Pathologic: Stage IIIB (pT3, pN1c, cM0) - Signed by Earlie Server, MD on 02/15/2020  #Stage IIIb colon cancer- 01/30/2020 pT3 pN1c, MMR intact  S/p adjuvant FOLFOX x 12, bolus 5-FU omitted starting cycle 2.  Labs are reviewed and discussed with patient. CEA is stable.  03/18/2021 CT showed no evidence of metastatic disease. Possible reflux. Parumbilical hernia.  Continue H&P/labs every 3 months, and CT scan every 6 months for 2 years, followed by annual CT up to year 5.  She needs repeat colonoscopy, will touch base with Dr. Marius Ditch.  #Chemotherapy-induced thrombocytopenia.   Her platelet count does not recover after stop  of chemotherapy.  Patient has adequate vitamin B12 and folate level.  Inappropriately normal immature platelet fraction 3.3, indicating decreased bone marrow production.  Continue observation.  Recommend patient to continue empiric vitamin B12 supplementation.  #Grade 2 chemotherapy induced neuropathy. Recommend patient to resume gabapentin 112m QHS.  Refills were sent to pharmacy.  #Morbid obesity, recommend exercise as tolerated.  Healthy diet and lifestyle modification  #Port-A-Cath in place, we will schedule patient to have port flush every 8 weeks  Spanish interpretor was present for the entire encounter.  Supportive care measures are necessary for patient well-being and will be provided as necessary. We spent sufficient time to discuss many aspect of care, questions were answered to patient's satisfaction.   Follow-up CT chest abdomen pelvis with contrast in 3 months, lab-CBC, CMP, CEA MD after CT   ZEarlie Server MD, PhD Hematology Oncology [facility]querqueat AEncompass Health Rehabilitation Hospital Of North AlabamaPager- [phone]/17/2022

## 2021-06-24 NOTE — Progress Notes (Signed)
Pt here for follow up. Pt reports numbness to feet.

## 2021-06-25 ENCOUNTER — Other Ambulatory Visit: Payer: Self-pay

## 2021-06-25 DIAGNOSIS — Z85038 Personal history of other malignant neoplasm of large intestine: Secondary | ICD-10-CM

## 2021-06-25 MED ORDER — PEG 3350-KCL-NA BICARB-NACL 420 G PO SOLR: 4000.0000 mL | Freq: Once | ORAL | 0 refills | Status: DC

## 2021-06-25 MED ORDER — GOLYTELY 236 G PO SOLR: 4000.0000 mL | Freq: Once | ORAL | 0 refills | Status: DC

## 2021-06-25 MED ORDER — PEG 3350-KCL-NA BICARB-NACL 420 G PO SOLR: 4000.0000 mL | Freq: Once | ORAL | 0 refills | Status: AC

## 2021-06-25 NOTE — Progress Notes (Signed)
Gastroenterology Pre-Procedure Review  Request Date: 07/10/2021 Requesting Physician: Dr. Marius Ditch  PATIENT REVIEW QUESTIONS: The patient responded to the following health history questions as indicated:    1. Are you having any GI issues? no 2. Do you have a personal history of Polyps? no 3. Do you have a family history of Colon Cancer or Polyps? no 4. Diabetes Mellitus? no 5. Joint replacements in the past 12 months?no 6. Major health problems in the past 3 months?no 7. Any artificial heart valves, MVP, or defibrillator?no    MEDICATIONS & ALLERGIES:    Patient reports the following regarding taking any anticoagulation/antiplatelet therapy:   Plavix, Coumadin, Eliquis, Xarelto, Lovenox, Pradaxa, Brilinta, or Effient? no Aspirin? no  Patient confirms/reports the following medications:  Current Outpatient Medications  Medication Sig Dispense Refill   polyethylene glycol-electrolytes (NULYTELY) 420 g solution Take 4,000 mLs by mouth once for 1 dose. 4000 mL 0   B-D 3CC LUER-LOK SYR 25GX1" 25G X 1" 3 ML MISC Use with B12 injection (Patient not taking: No sig reported)     cyanocobalamin (,VITAMIN B-12,) 1000 MCG/ML injection Inject 1,000 mcg into the muscle every 30 (thirty) days. (Patient not taking: No sig reported)     docusate sodium (COLACE) 100 MG capsule Take 1 capsule (100 mg total) by mouth daily. 30 capsule 1   gabapentin (NEURONTIN) 100 MG capsule Take 1 capsule (100 mg total) by mouth at bedtime. 90 capsule 1   hydrocortisone cream 0.5 % Apply 1 application topically 2 (two) times daily. Until rash is healed. (Patient not taking: No sig reported) 30 g 0   ibuprofen (ADVIL) 800 MG tablet Take 1 tablet (800 mg total) by mouth every 8 (eight) hours as needed. (Patient not taking: Reported on 06/24/2021) 30 tablet 0   levothyroxine (SYNTHROID) 137 MCG tablet Take 137 mcg by mouth daily before breakfast.      lidocaine-prilocaine (EMLA) cream Apply to affected area once 30 g 3    magic mouthwash w/lidocaine SOLN Take 5 mLs by mouth 4 (four) times daily as needed for mouth pain. Sig: Swish/Spit 5-10 ml four times a day as needed. Dispense 480 ml. 1RF (Patient not taking: Reported on 06/24/2021) 480 mL 1   nystatin (MYCOSTATIN/NYSTOP) powder Apply 1 application topically 3 (three) times daily. (Patient not taking: No sig reported) 30 g 0   ondansetron (ZOFRAN) 8 MG tablet Take 1 tablet (8 mg total) by mouth 2 (two) times daily as needed for refractory nausea / vomiting. Start on day 3 after chemotherapy. 30 tablet 1   polyethylene glycol (MIRALAX) 17 g packet Take 17 g by mouth daily as needed. 14 each 0   prochlorperazine (COMPAZINE) 10 MG tablet Take 1 tablet (10 mg total) by mouth every 6 (six) hours as needed (Nausea or vomiting). 30 tablet 1   No current facility-administered medications for this visit.    Patient confirms/reports the following allergies:  No Known Allergies  No orders of the defined types were placed in this encounter.   AUTHORIZATION INFORMATION Primary Insurance: 1D#: Group #:  Secondary Insurance: 1D#: Group #:  SCHEDULE INFORMATION: Date: 07/10/2021 Time: Location: [facility]

## 2021-06-26 ENCOUNTER — Telehealth: Payer: Self-pay

## 2021-06-26 NOTE — Telephone Encounter (Signed)
Called patient and left her a detailed message asking her to return our call tolet [name] know if she had insurance to file her procedure or not.

## 2021-06-27 NOTE — Telephone Encounter (Signed)
Called patient back to ask her if she had health insurance and she stated that she was self-pay. I then notified Elmyra Ricks.

## 2021-07-03 ENCOUNTER — Other Ambulatory Visit: Payer: Self-pay

## 2021-07-03 ENCOUNTER — Inpatient Hospital Stay: Payer: Self-pay

## 2021-07-03 DIAGNOSIS — Z95828 Presence of other vascular implants and grafts: Secondary | ICD-10-CM

## 2021-07-03 MED ORDER — HEPARIN SOD (PORK) LOCK FLUSH 100 UNIT/ML IV SOLN
INTRAVENOUS | Status: AC
  Administered 2021-07-03: 500 [IU] via INTRAVENOUS
  Filled 2021-07-03: qty 5

## 2021-07-03 MED ORDER — HEPARIN SOD (PORK) LOCK FLUSH 100 UNIT/ML IV SOLN
500.0000 [IU] | Freq: Once | INTRAVENOUS | Status: AC
  Filled 2021-07-03: qty 5

## 2021-07-03 MED ORDER — SODIUM CHLORIDE 0.9% FLUSH
10.0000 mL | INTRAVENOUS | Status: DC | PRN
  Administered 2021-07-03: 10 mL via INTRAVENOUS
  Filled 2021-07-03: qty 10

## 2021-07-10 ENCOUNTER — Ambulatory Visit: Payer: Self-pay | Admitting: Anesthesiology

## 2021-07-10 ENCOUNTER — Ambulatory Visit
Admission: RE | Admit: 2021-07-10 | Discharge: 2021-07-10 | Disposition: A | Discharge status: Home / Self Care | Payer: Self-pay | Attending: Gastroenterology | Admitting: Gastroenterology

## 2021-07-10 ENCOUNTER — Encounter: Payer: Self-pay | Admitting: Gastroenterology

## 2021-07-10 ENCOUNTER — Encounter
Admission: RE | Disposition: A | Discharge status: Home / Self Care | Payer: Self-pay | Source: Home / Self Care | Attending: Gastroenterology

## 2021-07-10 DIAGNOSIS — K635 Polyp of colon: Secondary | ICD-10-CM

## 2021-07-10 DIAGNOSIS — Z79890 Hormone replacement therapy (postmenopausal): Secondary | ICD-10-CM | POA: Insufficient documentation

## 2021-07-10 DIAGNOSIS — Z85038 Personal history of other malignant neoplasm of large intestine: Secondary | ICD-10-CM

## 2021-07-10 DIAGNOSIS — Z980 Intestinal bypass and anastomosis status: Secondary | ICD-10-CM | POA: Insufficient documentation

## 2021-07-10 DIAGNOSIS — Z08 Encounter for follow-up examination after completed treatment for malignant neoplasm: Secondary | ICD-10-CM | POA: Insufficient documentation

## 2021-07-10 DIAGNOSIS — Z9049 Acquired absence of other specified parts of digestive tract: Secondary | ICD-10-CM | POA: Insufficient documentation

## 2021-07-10 DIAGNOSIS — Z79899 Other long term (current) drug therapy: Secondary | ICD-10-CM | POA: Insufficient documentation

## 2021-07-10 HISTORY — PX: COLONOSCOPY WITH PROPOFOL: SHX5780

## 2021-07-10 SURGERY — COLONOSCOPY WITH PROPOFOL
Anesthesia: General

## 2021-07-10 MED ORDER — PROPOFOL 500 MG/50ML IV EMUL
INTRAVENOUS | Status: DC | PRN
  Administered 2021-07-10: 100 ug/kg/min via INTRAVENOUS

## 2021-07-10 MED ORDER — LIDOCAINE HCL (PF) 1 % IJ SOLN
INTRAMUSCULAR | Status: AC
  Filled 2021-07-10: qty 2

## 2021-07-10 MED ORDER — PHENYLEPHRINE HCL-NACL 20-0.9 MG/250ML-% IV SOLN
INTRAVENOUS | Status: AC
  Filled 2021-07-10: qty 250

## 2021-07-10 MED ORDER — PHENYLEPHRINE HCL (PRESSORS) 10 MG/ML IV SOLN
INTRAVENOUS | Status: DC | PRN
  Administered 2021-07-10: 40 ug via INTRAVENOUS

## 2021-07-10 MED ORDER — SODIUM CHLORIDE 0.9 % IV SOLN
INTRAVENOUS | Status: DC
  Administered 2021-07-10: 1000 mL via INTRAVENOUS

## 2021-07-10 NOTE — Anesthesia Postprocedure Evaluation (Signed)
Anesthesia Post Note  Patient: Debra [name]  Procedure(s) Performed: COLONOSCOPY WITH PROPOFOL  Patient location during evaluation: Endoscopy Anesthesia Type: General Level of consciousness: awake and alert and oriented Pain management: pain level controlled Vital Signs Assessment: post-procedure vital signs reviewed and stable Respiratory status: spontaneous breathing, nonlabored ventilation and respiratory function stable Cardiovascular status: blood pressure returned to baseline and stable Postop Assessment: no signs of nausea or vomiting Anesthetic complications: no   No notable events documented.   Last Vitals:  Vitals:   07/10/21 1152 07/10/21 1202  BP: (!) 104/51 130/79  Pulse: (!) 53 63  Resp: 20 20  Temp:    SpO2: 95%     Last Pain:  Vitals:   07/10/21 1202  TempSrc:   PainSc: 0-No pain                 Caileen Veracruz

## 2021-07-10 NOTE — H&P (Signed)
Cephas Darby, MD [address]  [place]  [place], [place] [address]  Main: [phone]  Fax: [phone] Pager: [phone]  Primary Care Physician:  [name], MD Primary Gastroenterologist:  Dr. Cephas Darby  Pre-Procedure History & Physical: HPI:  Debra [name] is a 55 y.o. female is here for an colonoscopy.   Past Medical History:  Diagnosis Date   Anemia    Colon cancer ([place]) 04/2020   Hypothyroidism    Thyroid disease     Past Surgical History:  Procedure Laterality Date   COLONOSCOPY WITH PROPOFOL N/A 11/21/2019   Procedure: COLONOSCOPY WITH PROPOFOL;  Surgeon: Lin Landsman, MD;  Location: [facility] ENDOSCOPY;  Service: Gastroenterology;  Laterality: N/A;   CYSTOSCOPY WITH STENT PLACEMENT Left 01/30/2020   Procedure: CYSTOSCOPY WITH STENT PLACEMENT;  Surgeon: Hollice Espy, MD;  Location: [facility];  Service: Urology;  Laterality: Left;   ESOPHAGOGASTRODUODENOSCOPY (EGD) WITH PROPOFOL N/A 11/21/2019   Procedure: ESOPHAGOGASTRODUODENOSCOPY (EGD) WITH PROPOFOL;  Surgeon: Lin Landsman, MD;  Location[facility] ENDOSCOPY;  Service: Gastroenterology;  Laterality: N/A;   IR RADIOLOGIST EVAL & MGMT  07/03/2020   IR RADIOLOGIST EVAL & MGMT  07/12/2020   PARTIAL COLECTOMY N/A 01/30/2020   Procedure: PARTIAL COLECTOMY Sigmoid;  Surgeon: [name], MD;  Location: [place] [facility];  Service: General;  Laterality: N/A;   PORTACATH PLACEMENT Left 03/28/2020   Procedure: INSERTION PORT-A-CATH;  Surgeon: [name], MD;  Location: [facility];  Service: General;  Laterality: Left;   PORTACATH PLACEMENT N/A 08/29/2020   Procedure: INSERTION PORT-A-CATH, chemotherapy;  Surgeon: [name], MD;  Location: [facility];  Service: General;  Laterality: N/A;    Prior to Admission medications   Medication Sig Start Date End Date Taking? Authorizing Provider  B-D 3CC LUER-LOK SYR 25GX1" 25G X 1" 3 ML MISC Use with B12 injection Patient not taking: No sig reported  01/10/20   [provider]  cyanocobalamin (,VITAMIN B-12,) 1000 MCG/ML injection Inject 1,000 mcg into the muscle every 30 (thirty) days. Patient not taking: No sig reported 01/10/20   [provider]  docusate sodium (COLACE) 100 MG capsule Take 1 capsule (100 mg total) by mouth daily. 05/18/20   Earlie Server, MD  gabapentin (NEURONTIN) 100 MG capsule Take 1 capsule (100 mg total) by mouth at bedtime. 06/24/21   Earlie Server, MD  hydrocortisone cream 0.5 % Apply 1 application topically 2 (two) times daily. Until rash is healed. Patient not taking: No sig reported 09/17/20   Earlie Server, MD  ibuprofen (ADVIL) 800 MG tablet Take 1 tablet (800 mg total) by mouth every 8 (eight) hours as needed. Patient not taking: Reported on 06/24/2021 02/14/20   Tylene Fantasia, PA-C  levothyroxine (SYNTHROID) 137 MCG tablet Take 137 mcg by mouth daily before breakfast.     [provider]  lidocaine-prilocaine (EMLA) cream Apply to affected area once 09/17/20   Earlie Server, MD  magic mouthwash w/lidocaine SOLN Take 5 mLs by mouth 4 (four) times daily as needed for mouth pain. Sig: Swish/Spit 5-10 ml four times a day as needed. Dispense 480 ml. 1RF Patient not taking: Reported on 06/24/2021 09/17/20   Earlie Server, MD  nystatin (MYCOSTATIN/NYSTOP) powder Apply 1 application topically 3 (three) times daily. Patient not taking: No sig reported 10/15/20   Earlie Server, MD  ondansetron (ZOFRAN) 8 MG tablet Take 1 tablet (8 mg total) by mouth 2 (two) times daily as needed for refractory nausea / vomiting. Start on day 3 after  chemotherapy. 03/08/20   Earlie Server, MD  polyethylene glycol (MIRALAX) 17 g packet Take 17 g by mouth daily as needed. 10/29/20   Earlie Server, MD  prochlorperazine (COMPAZINE) 10 MG tablet Take 1 tablet (10 mg total) by mouth every 6 (six) hours as needed (Nausea or vomiting). 03/08/20   Earlie Server, MD    Allergies as of 06/26/2021   (No Known Allergies)    Family History  Problem Relation Age of Onset    Diabetes Sister     Social History   Socioeconomic History   Marital status: Single    Spouse name: Not on file   Number of children: Not on file   Years of education: Not on file   Highest education level: Not on file  Occupational History   Occupation: Factory  Tobacco Use   Smoking status: Never   Smokeless tobacco: Never  Vaping Use   Vaping Use: Never used  Substance and Sexual Activity   Alcohol use: Never   Drug use: Never   Sexual activity: Not on file  Other Topics Concern   Not on file  Social History Narrative   Lives at home with significant other   Social Determinants of Health   Financial Resource Strain: Not on file  Food Insecurity: Not on file  Transportation Needs: Not on file  Physical Activity: Not on file  Stress: Not on file  Social Connections: Not on file  Intimate Partner Violence: Not on file    Review of Systems: See HPI, otherwise negative ROS  Physical Exam: BP (!) 148/74   Pulse (!) 54   Temp 97.6 F (36.4 C) (Temporal)   Resp 17   Ht 4\' 11"  (1.499 m)   Wt 189 lb 2.5 oz (85.8 kg)   SpO2 100%   BMI 38.20 kg/m  General:   Alert,  pleasant and cooperative in NAD Head:  Normocephalic and atraumatic. Neck:  Supple; no masses or thyromegaly. Lungs:  Clear throughout to auscultation.    Heart:  Regular rate and rhythm. Abdomen:  Soft, nontender and nondistended. Normal bowel sounds, without guarding, and without rebound.   Neurologic:  Alert and  oriented x4;  grossly normal neurologically.  Impression/Plan: Debra [name] is here for an colonoscopy to be performed for h/o sigmoid colon cancer s/p sigmoid colectomy  Risks, benefits, limitations, and alternatives regarding  colonoscopy have been reviewed with the patient.  Questions have been answered.  All parties agreeable.   Sherri Sear, MD  07/10/2021, 11:25 AM

## 2021-07-10 NOTE — Op Note (Signed)
[facility] Patient Name: Debra [name] Procedure Date: 07/10/2021 11:26 AM MRN: [id number] Account #: [id number] Date of Birth: [birth year] Admit Type: Outpatient Age: 55 Room: [facility] ENDO ROOM 4 Gender: Female Note Status: Finalized Instrument Name: Park Meo [id number] Procedure:             Colonoscopy Indications:           High risk colon cancer surveillance: Personal history                         of colon cancer, Last colonoscopy: March 2021 Providers:             Lin Landsman MD, MD Referring MD:          [name] MD, MD (Referring MD) Medicines:             General Anesthesia Complications:         No immediate complications. Estimated blood loss: None. Procedure:             Pre-Anesthesia Assessment:                        - Prior to the procedure, a History and Physical was                         performed, and patient medications and allergies were                         reviewed. The patient is competent. The risks and                         benefits of the procedure and the sedation options and                         risks were discussed with the patient. All questions                         were answered and informed consent was obtained.                         Patient identification and proposed procedure were                         verified by the physician, the nurse, the                         anesthesiologist, the anesthetist and the technician                         in the pre-procedure area in the procedure room in the                         endoscopy suite. Mental Status Examination: alert and                         oriented. Airway Examination: normal oropharyngeal                         airway and neck mobility. Respiratory Examination:  clear to auscultation. CV Examination: normal.                         Prophylactic Antibiotics: The patient does not require                          prophylactic antibiotics. Prior Anticoagulants: The                         patient has taken no previous anticoagulant or                         antiplatelet agents. ASA Grade Assessment: II - A                         patient with mild systemic disease. After reviewing                         the risks and benefits, the patient was deemed in                         satisfactory condition to undergo the procedure. The                         anesthesia plan was to use general anesthesia.                         Immediately prior to administration of medications,                         the patient was re-assessed for adequacy to receive                         sedatives. The heart rate, respiratory rate, oxygen                         saturations, blood pressure, adequacy of pulmonary                         ventilation, and response to care were monitored                         throughout the procedure. The physical status of the                         patient was re-assessed after the procedure.                        After obtaining informed consent, the colonoscope was                         passed under direct vision. Throughout the procedure,                         the patient's blood pressure, pulse, and oxygen                         saturations were monitored continuously. The  Colonoscope was introduced through the anus and                         advanced to the the terminal ileum, with                         identification of the appendiceal orifice and IC                         valve. The colonoscopy was performed without                         difficulty. The patient tolerated the procedure well.                         The quality of the bowel preparation was evaluated                         using the BBPS [facility] Bowel Preparation Scale) with                         scores of: Right Colon = 3, Transverse Colon = 3 and                          Left Colon = 3 (entire mucosa seen well with no                         residual staining, small fragments of stool or opaque                         liquid). The total BBPS score equals 9. Findings:      The perianal and digital rectal examinations were normal. Pertinent       negatives include normal sphincter tone and no palpable rectal lesions.      The terminal ileum appeared normal.      There was evidence of a prior end-to-side colo-colonic anastomosis at 25       cm proximal to the anus. This was patent and was characterized by       healthy appearing mucosa. The anastomosis was traversed.      A 5 mm polyp was found in the cecum. The polyp was sessile. The polyp       was removed with a cold snare. Resection and retrieval were complete.       Estimated blood loss was minimal.      The retroflexed view of the distal rectum and anal verge was normal and       showed no anal or rectal abnormalities.      The exam was otherwise without abnormality. Impression:            - The examined portion of the ileum was normal.                        - Patent end-to-side colo-colonic anastomosis,                         characterized by healthy appearing mucosa.                        -  One 5 mm polyp in the cecum, removed with a cold                         snare. Resected and retrieved.                        - The distal rectum and anal verge are normal on                         retroflexion view.                        - The examination was otherwise normal. Recommendation:        - Discharge patient to home (with escort).                        - Resume previous diet today.                        - Continue present medications.                        - Await pathology results.                        - Repeat colonoscopy in 3 years for surveillance. Procedure Code(s):     --- Professional ---                        [phone], Colonoscopy, flexible; with removal of                          tumor(s), polyp(s), or other lesion(s) by snare                         technique Diagnosis Code(s):     --- Professional ---                        F35.456, Personal history of other malignant neoplasm                         of large intestine                        Z98.0, Intestinal bypass and anastomosis status                        K63.5, Polyp of colon CPT copyright 2019 [employer]. All rights reserved. The codes documented in this report are preliminary and upon coder review may  be revised to meet current compliance requirements. Dr. Ulyess Mort Lin Landsman MD, MD 07/10/2021 11:45:09 AM This report has been signed electronically. Number of Addenda: 0 Note Initiated On: 07/10/2021 11:26 AM Scope Withdrawal Time: 0 hours 8 minutes 38 seconds  Total Procedure Duration: 0 hours 9 minutes 28 seconds  Estimated Blood Loss:  Estimated blood loss: none.      [facility]

## 2021-07-10 NOTE — Anesthesia Preprocedure Evaluation (Signed)
Anesthesia Evaluation  Patient identified by MRN, date of birth, ID band Patient awake    Reviewed: Allergy & Precautions, NPO status , Patient's Chart, lab work & pertinent test results  History of Anesthesia Complications Negative for: history of anesthetic complications  Airway Mallampati: II  TM Distance: >3 FB Neck ROM: Full    Dental  (+) Poor Dentition   Pulmonary sleep apnea , neg COPD,    breath sounds clear to auscultation- rhonchi (-) wheezing      Cardiovascular Exercise Tolerance: Good (-) hypertension(-) CAD, (-) Past MI, (-) Cardiac Stents and (-) CABG  Rhythm:Regular Rate:Normal - Systolic murmurs and - Diastolic murmurs    Neuro/Psych neg Seizures negative neurological ROS  negative psych ROS   GI/Hepatic negative GI ROS, Neg liver ROS,   Endo/Other  neg diabetesHypothyroidism   Renal/GU negative Renal ROS     Musculoskeletal negative musculoskeletal ROS (+)   Abdominal (+) + obese,   Peds  Hematology  (+) anemia ,   Anesthesia Other Findings Past Medical History: No date: Anemia 04/2020: Colon cancer (HCC) No date: Hypothyroidism No date: Thyroid disease   Reproductive/Obstetrics                             Anesthesia Physical Anesthesia Plan  ASA: 2  Anesthesia Plan: General   Post-op Pain Management:    Induction: Intravenous  PONV Risk Score and Plan: 2 and Propofol infusion  Airway Management Planned: Natural Airway  Additional Equipment:   Intra-op Plan:   Post-operative Plan:   Informed Consent: I have reviewed the patients History and Physical, chart, labs and discussed the procedure including the risks, benefits and alternatives for the proposed anesthesia with the patient or authorized representative who has indicated his/her understanding and acceptance.     Dental advisory given  Plan Discussed with: CRNA and  Anesthesiologist  Anesthesia Plan Comments:         Anesthesia Quick Evaluation

## 2021-07-10 NOTE — Transfer of Care (Addendum)
Immediate Anesthesia Transfer of Care Note  Patient: Debra [name]  Procedure(s) Performed: COLONOSCOPY WITH PROPOFOL  Patient Location: PACU  Anesthesia Type:General  Level of Consciousness: awake  Airway & Oxygen Therapy: Patient Spontanous Breathing  Post-op Assessment: Report given to RN  Post vital signs: stable  Last Vitals:  Vitals Value Taken Time  BP    Temp    Pulse    Resp    SpO2      Last Pain:  Vitals:   07/10/21 1054  TempSrc: Temporal         Complications: No notable events documented.

## 2021-07-11 ENCOUNTER — Encounter: Payer: Self-pay | Admitting: Gastroenterology

## 2021-07-11 LAB — SURGICAL PATHOLOGY

## 2021-09-23 ENCOUNTER — Ambulatory Visit
Admission: RE | Admit: 2021-09-23 | Discharge: 2021-09-23 | Disposition: A | Discharge status: Home / Self Care | Payer: Self-pay | Source: Ambulatory Visit | Attending: Oncology | Admitting: Oncology

## 2021-09-23 ENCOUNTER — Other Ambulatory Visit: Payer: Self-pay

## 2021-09-23 DIAGNOSIS — C187 Malignant neoplasm of sigmoid colon: Secondary | ICD-10-CM | POA: Insufficient documentation

## 2021-09-23 MED ORDER — IOHEXOL 300 MG/ML  SOLN
100.0000 mL | Freq: Once | INTRAMUSCULAR | Status: AC | PRN
  Administered 2021-09-23: 100 mL via INTRAVENOUS

## 2021-09-26 ENCOUNTER — Encounter: Payer: Self-pay | Admitting: Oncology

## 2021-09-26 ENCOUNTER — Other Ambulatory Visit: Payer: Self-pay

## 2021-09-26 ENCOUNTER — Inpatient Hospital Stay: Payer: Self-pay | Attending: Oncology

## 2021-09-26 ENCOUNTER — Inpatient Hospital Stay (HOSPITAL_BASED_OUTPATIENT_CLINIC_OR_DEPARTMENT_OTHER): Payer: Self-pay | Admitting: Oncology

## 2021-09-26 VITALS — BP 127/82 | HR 106 | Temp 96.5°F | Resp 18 | Wt 194.0 lb

## 2021-09-26 DIAGNOSIS — D6959 Other secondary thrombocytopenia: Secondary | ICD-10-CM

## 2021-09-26 DIAGNOSIS — G620 Drug-induced polyneuropathy: Secondary | ICD-10-CM | POA: Insufficient documentation

## 2021-09-26 DIAGNOSIS — Z95828 Presence of other vascular implants and grafts: Secondary | ICD-10-CM

## 2021-09-26 DIAGNOSIS — C187 Malignant neoplasm of sigmoid colon: Secondary | ICD-10-CM | POA: Insufficient documentation

## 2021-09-26 DIAGNOSIS — G629 Polyneuropathy, unspecified: Secondary | ICD-10-CM

## 2021-09-26 DIAGNOSIS — T451X5A Adverse effect of antineoplastic and immunosuppressive drugs, initial encounter: Secondary | ICD-10-CM

## 2021-09-26 DIAGNOSIS — D500 Iron deficiency anemia secondary to blood loss (chronic): Secondary | ICD-10-CM | POA: Insufficient documentation

## 2021-09-26 DIAGNOSIS — E669 Obesity, unspecified: Secondary | ICD-10-CM

## 2021-09-26 DIAGNOSIS — C189 Malignant neoplasm of colon, unspecified: Secondary | ICD-10-CM

## 2021-09-26 LAB — CBC WITH DIFFERENTIAL/PLATELET
Abs Immature Granulocytes: 0 10*3/uL (ref 0.00–0.07)
Basophils Absolute: 0 10*3/uL (ref 0.0–0.1)
Basophils Relative: 0 %
Eosinophils Absolute: 0.1 10*3/uL (ref 0.0–0.5)
Eosinophils Relative: 3 %
HCT: 34.7 % — ABNORMAL LOW (ref 36.0–46.0)
Hemoglobin: 11.9 g/dL — ABNORMAL LOW (ref 12.0–15.0)
Immature Granulocytes: 0 %
Lymphocytes Relative: 30 %
Lymphs Abs: 1.2 10*3/uL (ref 0.7–4.0)
MCH: 29.4 pg (ref 26.0–34.0)
MCHC: 34.3 g/dL (ref 30.0–36.0)
MCV: 85.7 fL (ref 80.0–100.0)
Monocytes Absolute: 0.4 10*3/uL (ref 0.1–1.0)
Monocytes Relative: 11 %
Neutro Abs: 2.3 10*3/uL (ref 1.7–7.7)
Neutrophils Relative %: 56 %
Platelets: 74 10*3/uL — ABNORMAL LOW (ref 150–400)
RBC: 4.05 MIL/uL (ref 3.87–5.11)
RDW: 13.2 % (ref 11.5–15.5)
WBC: 4.1 10*3/uL (ref 4.0–10.5)
nRBC: 0 % (ref 0.0–0.2)

## 2021-09-26 LAB — COMPREHENSIVE METABOLIC PANEL
ALT: 18 U/L (ref 0–44)
AST: 31 U/L (ref 15–41)
Albumin: 4 g/dL (ref 3.5–5.0)
Alkaline Phosphatase: 125 U/L (ref 38–126)
Anion gap: 6 (ref 5–15)
BUN: 9 mg/dL (ref 6–20)
CO2: 26 mmol/L (ref 22–32)
Calcium: 8.8 mg/dL — ABNORMAL LOW (ref 8.9–10.3)
Chloride: 103 mmol/L (ref 98–111)
Creatinine, Ser: 0.42 mg/dL — ABNORMAL LOW (ref 0.44–1.00)
GFR, Estimated: >60 mL/min (ref >60)
Glucose, Bld: 119 mg/dL — ABNORMAL HIGH (ref 70–99)
Potassium: 3.6 mmol/L (ref 3.5–5.1)
Sodium: 135 mmol/L (ref 135–145)
Total Bilirubin: 0.6 mg/dL (ref 0.3–1.2)
Total Protein: 7.3 g/dL (ref 6.5–8.1)

## 2021-09-26 MED ORDER — SODIUM CHLORIDE 0.9% FLUSH
10.0000 mL | Freq: Once | INTRAVENOUS | Status: AC
  Administered 2021-09-26: 10 mL via INTRAVENOUS
  Filled 2021-09-26: qty 10

## 2021-09-26 MED ORDER — HEPARIN SOD (PORK) LOCK FLUSH 100 UNIT/ML IV SOLN
500.0000 [IU] | Freq: Once | INTRAVENOUS | Status: AC
  Administered 2021-09-26: 500 [IU] via INTRAVENOUS
  Filled 2021-09-26: qty 5

## 2021-09-26 NOTE — Progress Notes (Signed)
.   Hematology/Oncology Progress Note  [phone] [phone]  Patient Care Team: [name], MD as PCP - General (Family Medicine) [name], RN as Registered Nurse [name], RN as Registered Nurse   Name of the patient: Debra [name]  [id number]  [birth year]  Date of visit: 09/26/21  [place] Debra [name] is a 56 y.o.afemale who has above oncology history reviewed by me today presented for follow up visit for management of Stage IIIB Colon cancer seen by me on 11/21/2019 during her hospitalization. Patient has a history of Covid positivity. Severe iron deficiency status post IV Venofer treatment. Colonoscopy 11/21/2019 showed a malignant appearing severe stenosis found in the sigmoid colon at 25 cm proximal to the anus and was nontransversed.  Biopsy was taken.  Surgical pathology is pending  Nonbleeding external hemorrhoids Biopsy was not diagnostic 01/03/2020 virtual colonoscopy showed long segment area of wall thickening and annular constriction within the sigmoid colon in the area of previous concern most compatible with annular malignancy.  Remainder of the colon evaluation is somewhat limited due to large amount of retained stool and incomplete distention proximal to the annular constricting lesion. 01/30/2020 Patient underwent left hemicolectomy by Dr. Celine Ahr and a biopsy showed invasive adenocarcinoma, poorly differentiated.  37 lymph nodes negative for malignancy.  Grade 3, all margins are uninvolved.  Perineural invasion present.  Tumor deposit present. pT3 pN1c cM0 Checks x-ray did not show any lung mass. #Postop, patient developed postop ileus and she required TPN, PICC and NG tube placement.  Postoperation day 9 she continued to have increased leukocytosis and was subsequently found to have a midline wound infection to the inferior portion of the wound.  Patient was treated with antibiotics.  07/03/2020 CT  abdomen pelvis w contrast showed well positioned drainage catheter with apparent resolution of the intra-abdominal abscess. 07/10/2020 seen by [facility] and patient had drainage catheter removed by IR on 07/12/2020  09/03/2020-01/14/2021 Adjuvant chemotherapy FOLFOX x 12, bolus 5-FU omitted starting cycle 2.  INTERVAL HISTORY Debra [name] is a 56 y.o. female who has above history reviewed by me today presents for follow up visit for management of stage IIIb colon cancer She reports chronic numbness of her hands and feet. Not worse.  Patient has experienced dizziness when standing up or looking down.  .  Review of systems- Review of Systems  Constitutional:  Positive for fatigue. Negative for appetite change, chills, fever and unexpected weight change.  HENT:   Negative for hearing loss and voice change.   Eyes:  Negative for eye problems.  Respiratory:  Negative for chest tightness, cough and shortness of breath.   Cardiovascular:  Negative for chest pain.  Gastrointestinal:  Negative for abdominal distention, abdominal pain, blood in stool and constipation.  Endocrine: Negative for hot flashes.  Genitourinary:  Negative for difficulty urinating, dysuria and frequency.   Musculoskeletal:  Negative for arthralgias.  Skin:  Negative for itching and wound.  Neurological:  Positive for light-headedness and numbness. Negative for extremity weakness and headaches.  Hematological:  Negative for adenopathy.  Psychiatric/Behavioral:  Negative for confusion.    No Known Allergies  Patient Active Problem List   Diagnosis Date Noted   Hx of colon cancer, stage I    Polyp of colon    Encounter for antineoplastic chemotherapy 09/03/2020   Encounter for central line placement    Loss of weight 02/15/2020   Goals of care, counseling/discussion 02/15/2020   Malignant neoplasm of sigmoid  colon ([place]) 02/15/2020   Pressure injury of skin 02/03/2020   Bowel obstruction ([place]) 01/29/2020   Iron  deficiency anemia due to chronic blood loss 11/22/2019   COVID-19 virus detected 11/20/2019   Symptomatic anemia 11/20/2019   Hypothyroidism 11/20/2019   Iron deficiency anemia 11/20/2019   GIB (gastrointestinal bleeding) 11/20/2019     Past Medical History:  Diagnosis Date   Anemia    Colon cancer ([place]) 04/2020   Hypothyroidism    Thyroid disease      Past Surgical History:  Procedure Laterality Date   COLONOSCOPY WITH PROPOFOL N/A 11/21/2019   Procedure: COLONOSCOPY WITH PROPOFOL;  Surgeon: Lin Landsman, MD;  Location: [place];  Service: Gastroenterology;  Laterality: N/A;   COLONOSCOPY WITH PROPOFOL N/A 07/10/2021   Procedure: COLONOSCOPY WITH PROPOFOL;  Surgeon: Lin Landsman, MD;  Location: [facility] ENDOSCOPY;  Service: Gastroenterology;  Laterality: N/A;   CYSTOSCOPY WITH STENT PLACEMENT Left 01/30/2020   Procedure: CYSTOSCOPY WITH STENT PLACEMENT;  Surgeon: Hollice Espy, MD;  Location: [facility] ORS;  Service: Urology;  Laterality: Left;   ESOPHAGOGASTRODUODENOSCOPY (EGD) WITH PROPOFOL N/A 11/21/2019   Procedure: ESOPHAGOGASTRODUODENOSCOPY (EGD) WITH PROPOFOL;  Surgeon: Lin Landsman, MD;  Location: [facility] ENDOSCOPY;  Service: Gastroenterology;  Laterality: N/A;   IR RADIOLOGIST EVAL & MGMT  07/03/2020   IR RADIOLOGIST EVAL & MGMT  07/12/2020   PARTIAL COLECTOMY N/A 01/30/2020   Procedure: PARTIAL COLECTOMY Sigmoid;  Surgeon: [name], MD;  Location: [facility] ORS;  Service: General;  Laterality: N/A;   PORTACATH PLACEMENT Left 03/28/2020   Procedure: INSERTION PORT-A-CATH;  Surgeon: [name], MD;  Location: [facility] ORS;  Service: General;  Laterality: Left;   PORTACATH PLACEMENT N/A 08/29/2020   Procedure: INSERTION PORT-A-CATH, chemotherapy;  Surgeon: [name], MD;  Location: [facility] ORS;  Service: General;  Laterality: N/A;    Social History   Socioeconomic History   Marital status: Single    Spouse name: Not on file   Number of children:  Not on file   Years of education: Not on file   Highest education level: Not on file  Occupational History   Occupation: Factory  Tobacco Use   Smoking status: Never   Smokeless tobacco: Never  Vaping Use   Vaping Use: Never used  Substance and Sexual Activity   Alcohol use: Never   Drug use: Never   Sexual activity: Not on file  Other Topics Concern   Not on file  Social History Narrative   Lives at home with significant other   Social Determinants of Health   Financial Resource Strain: Not on file  Food Insecurity: Not on file  Transportation Needs: Not on file  Physical Activity: Not on file  Stress: Not on file  Social Connections: Not on file  Intimate Partner Violence: Not on file     Family History  Problem Relation Age of Onset   Diabetes Sister      Current Outpatient Medications:    docusate sodium (COLACE) 100 MG capsule, Take 1 capsule (100 mg total) by mouth daily., Disp: 30 capsule, Rfl: 1   gabapentin (NEURONTIN) 100 MG capsule, Take 1 capsule (100 mg total) by mouth at bedtime., Disp: 90 capsule, Rfl: 1   levothyroxine (SYNTHROID) 137 MCG tablet, Take 137 mcg by mouth daily before breakfast. , Disp: , Rfl:    lidocaine-prilocaine (EMLA) cream, Apply to affected area once, Disp: 30 g, Rfl: 3   Multiple Vitamin (MULTIVITAMIN) tablet, Take 1 tablet by mouth daily., Disp: , Rfl:  ondansetron (ZOFRAN) 8 MG tablet, Take 1 tablet (8 mg total) by mouth 2 (two) times daily as needed for refractory nausea / vomiting. Start on day 3 after chemotherapy. (Patient not taking: Reported on 09/26/2021), Disp: 30 tablet, Rfl: 1   polyethylene glycol (MIRALAX) 17 g packet, Take 17 g by mouth daily as needed. (Patient not taking: Reported on 09/26/2021), Disp: 14 each, Rfl: 0   prochlorperazine (COMPAZINE) 10 MG tablet, Take 1 tablet (10 mg total) by mouth every 6 (six) hours as needed (Nausea or vomiting). (Patient not taking: Reported on 09/26/2021), Disp: 30 tablet, Rfl:  1   Physical exam:  Vitals:   09/26/21 1419  BP: 127/82  Pulse: (!) 106  Resp: 18  Temp: (!) 96.5 F (35.8 C)  Weight: 194 lb (88 kg)   Physical Exam Constitutional:      General: She is not in acute distress.    Appearance: She is obese. She is not diaphoretic.     Comments: Patient walks independently  HENT:     Head: Normocephalic and atraumatic.     Nose: Nose normal.     Mouth/Throat:     Pharynx: No oropharyngeal exudate.  Eyes:     General: No scleral icterus.    Pupils: Pupils are equal, round, and reactive to light.  Cardiovascular:     Rate and Rhythm: Normal rate and regular rhythm.     Heart sounds: No murmur heard. Pulmonary:     Effort: Pulmonary effort is normal. No respiratory distress.     Breath sounds: No rales.  Chest:     Chest wall: No tenderness.  Abdominal:     General: There is no distension.     Palpations: Abdomen is soft.     Tenderness: There is no abdominal tenderness.  Musculoskeletal:        General: Normal range of motion.     Cervical back: Normal range of motion and neck supple.  Skin:    General: Skin is warm and dry.     Findings: No erythema.     Comments: Right side anterior chest wall + Mediport vitiligo  Neurological:     Mental Status: She is alert and oriented to person, place, and time.     Cranial Nerves: No cranial nerve deficit.     Motor: No abnormal muscle tone.     Coordination: Coordination normal.  Psychiatric:        Mood and Affect: Affect normal.      CMP Latest Ref Rng & Units 09/26/2021  Glucose 70 - 99 mg/dL 119(H)  BUN 6 - 20 mg/dL 9  Creatinine 0.44 - 1.00 mg/dL 0.42(L)  Sodium 135 - 145 mmol/L 135  Potassium 3.5 - 5.1 mmol/L 3.6  Chloride 98 - 111 mmol/L 103  CO2 22 - 32 mmol/L 26  Calcium 8.9 - 10.3 mg/dL 8.8(L)  Total Protein 6.5 - 8.1 g/dL 7.3  Total Bilirubin 0.3 - 1.2 mg/dL 0.6  Alkaline Phos 38 - 126 U/L 125  AST 15 - 41 U/L 31  ALT 0 - 44 U/L 18   CBC Latest Ref Rng & Units  09/26/2021  WBC 4.0 - 10.5 K/uL 4.1  Hemoglobin 12.0 - 15.0 g/dL 11.9(L)  Hematocrit 36.0 - 46.0 % 34.7(L)  Platelets 150 - 400 K/uL 74(L)    RADIOGRAPHIC STUDIES: I have personally reviewed the radiological images as listed and agreed with the findings in the report. CT CHEST ABDOMEN PELVIS W CONTRAST  Result Date: 09/23/2021  CLINICAL DATA:  Colon cancer restaging EXAM: CT CHEST, ABDOMEN, AND PELVIS WITH CONTRAST TECHNIQUE: Multidetector CT imaging of the chest, abdomen and pelvis was performed following the standard protocol during bolus administration of intravenous contrast. RADIATION DOSE REDUCTION: This exam was performed according to the departmental dose-optimization program which includes automated exposure control, adjustment of the mA and/or kV according to patient size and/or use of iterative reconstruction technique. CONTRAST:  154m OMNIPAQUE IOHEXOL 300 MG/ML  SOLN COMPARISON:  03/18/2021 FINDINGS: CT CHEST FINDINGS Cardiovascular: The heart size is normal. No substantial pericardial effusion. No thoracic aortic aneurysm. Right Port-A-Cath tip is positioned at the SVC/RA junction. Mediastinum/Nodes: No mediastinal lymphadenopathy. There is no hilar lymphadenopathy. Contrast material in distal esophagus likely secondary to reflux. There is no axillary lymphadenopathy. Lungs/Pleura: Mosaic attenuation noted in the lungs bilaterally, new in the interval. No suspicious pulmonary nodule or mass. No dense focal airspace consolidation no pleural effusion. Chronic atelectasis or scarring noted left lower lobe Musculoskeletal: No worrisome lytic or sclerotic osseous abnormality. CT ABDOMEN PELVIS FINDINGS Hepatobiliary: No suspicious focal abnormality within the liver parenchyma. There is no evidence for gallstones, gallbladder wall thickening, or pericholecystic fluid. No intrahepatic or extrahepatic biliary dilation. Pancreas: No focal mass lesion. No dilatation of the main duct. No  intraparenchymal cyst. No peripancreatic edema. Spleen: No splenomegaly. No focal mass lesion. Adrenals/Urinary Tract: No adrenal nodule or mass. Kidneys unremarkable. No evidence for hydroureter. The urinary bladder appears normal for the degree of distention. Stomach/Bowel: Stomach is moderately distended with contrast material. Duodenum is normally positioned as is the ligament of Treitz. No small bowel wall thickening. No small bowel dilatation. The terminal ileum is normal. The appendix is normal. Status post left hemicolectomy. Vascular/Lymphatic: No abdominal aortic aneurysm. There is no gastrohepatic or hepatoduodenal ligament lymphadenopathy. No retroperitoneal or mesenteric lymphadenopathy. Small lymph nodes in the ileocolic mesentery (702/6 are slightly more conspicuous than on prior. No pelvic sidewall lymphadenopathy. Reproductive: Unremarkable. Other: 1.1 x 1.4. Cm nodular focus of soft tissue attenuation identified anterior pelvis near the anastomosis. This was not visible on the prior study. Stable appearance of umbilical hernia. Musculoskeletal: No worrisome lytic or sclerotic osseous abnormality. IMPRESSION: 1. Status post left hemicolectomy. There is a 1.4 x 1.4 cm nodular focus of soft tissue attenuation anterior pelvis near the anastomosis. This was not visible on the prior study. Close attention on follow-up recommended. 2. Small lymph nodes in the ileocolic mesentery are slightly more conspicuous than on the prior study. Attention on follow-up recommended. 3. Mosaic attenuation in the lungs bilaterally, new in the interval. This can be seen in the setting of small airways disease/air trapping. 4. Contrast material in the distal esophagus likely secondary to reflux. Electronically Signed   By: EMisty StanleyM.D.   On: 09/23/2021 13:37    Assessment and plan-  1. Malignant neoplasm of colon, unspecified part of colon ([place]   2. Port-A-Cath in place   3. Neuropathy   4. Chemotherapy-induced  thrombocytopenia   5. Obesity (BMI 35.0-39.9 without comorbidity)    Cancer Staging  Malign[place] colon [place] Staging form: Colon and Rectum, AJCC 8th Edition - Pathologic: Stage IIIB (pT3, pN1c, cM0) - Signed by YEarlie Server MD on 02/15/2020  #Stage IIIb colon cancer- 01/30/2020 pT3 pN1c, MMR intact  S/p adjuvant FOLFOX x 12, bolus 5-FU omitted starting cycle 2.  Labs are reviewed and discussed with patient CEA is pending today. 09/23/2021 CT chest abdomen pelvis with contrast showed 1.4 x 1.4 nodular focus of  soft tissue attenuation anterior pelvis near the anastomosis.  Close attention on follow-up.  Small lymph nodes in the ileocolic mesentery are slightly more conspicuous than on the prior study.  Nausea continuation of lungs bilaterally Interval.  Contrast material in the distal esophagus like secondary to reflux. Image results were reviewed and discussed with patient. Repeat CT scan in 3 months.  #Chemotherapy-induced thrombocytopenia.   Her platelet count does not recover after stop of chemotherapy.  inappropriately normal immature platelet fraction 3.3, indicating decreased bone marrow production.  Continue observation.  Continue empiric vitamin B12 supplementation.  #Grade 2 chemotherapy induced neuropathy. Continue gabapentin nightly.  #Morbid obesity, recommend exercise as tolerated.  Healthy diet and lifestyle modification  #Port-A-Cath in place, we will schedule patient to have port flush every 8 weeks  Spanish interpretor was present for the entire encounter.  Supportive care measures are necessary for patient well-being and will be provided as necessary. We spent sufficient time to discuss many aspect of care, questions were answered to Satisfaction.   Follow-up CT chest abdomen pelvis with contrast in 3 months, lab-CBC, CMP, CEA MD after CT   Earlie Server, MD, PhD 09/26/2021

## 2021-09-26 NOTE — Progress Notes (Signed)
Patient here for follow up. Pt reports she gets dizzy when standing up or looking down.

## 2021-09-27 LAB — VITAMIN B12: Vitamin B-12: 345 pg/mL (ref 180–914)

## 2021-09-27 LAB — CEA: CEA: 4.9 ng/mL — ABNORMAL HIGH (ref 0.0–4.7)

## 2021-10-08 ENCOUNTER — Telehealth: Payer: Self-pay

## 2021-10-08 DIAGNOSIS — C189 Malignant neoplasm of colon, unspecified: Secondary | ICD-10-CM

## 2021-10-08 NOTE — Telephone Encounter (Signed)
-----   Message from Earlie Server, MD sent at 10/07/2021 11:23 PM EST ----- Please schedule her to repeat CEA in 4 weeks.

## 2021-10-08 NOTE — Telephone Encounter (Signed)
[place], please schedule patient for Labs in 4 weeks. Please notify patient. Thanks.

## 2021-10-08 NOTE — Telephone Encounter (Signed)
Add lab to PF on 3/2, it will be approx 4 weeks.

## 2021-10-08 NOTE — Progress Notes (Signed)
Phone note created 

## 2021-10-24 ENCOUNTER — Encounter: Payer: Self-pay | Admitting: Oncology

## 2021-11-07 ENCOUNTER — Other Ambulatory Visit: Payer: Self-pay

## 2021-11-07 ENCOUNTER — Inpatient Hospital Stay: Payer: Self-pay | Attending: Oncology

## 2021-11-07 ENCOUNTER — Inpatient Hospital Stay: Payer: Self-pay

## 2021-11-07 DIAGNOSIS — C187 Malignant neoplasm of sigmoid colon: Secondary | ICD-10-CM | POA: Insufficient documentation

## 2021-11-07 DIAGNOSIS — Z95828 Presence of other vascular implants and grafts: Secondary | ICD-10-CM

## 2021-11-07 MED ORDER — HEPARIN SOD (PORK) LOCK FLUSH 100 UNIT/ML IV SOLN
500.0000 [IU] | Freq: Once | INTRAVENOUS | Status: AC
  Administered 2021-11-07: 500 [IU] via INTRAVENOUS
  Filled 2021-11-07: qty 5

## 2021-11-07 MED ORDER — SODIUM CHLORIDE 0.9% FLUSH
10.0000 mL | Freq: Once | INTRAVENOUS | Status: AC
  Administered 2021-11-07: 10 mL via INTRAVENOUS
  Filled 2021-11-07: qty 10

## 2021-12-19 ENCOUNTER — Inpatient Hospital Stay: Payer: Self-pay | Attending: Oncology

## 2021-12-19 DIAGNOSIS — G629 Polyneuropathy, unspecified: Secondary | ICD-10-CM | POA: Insufficient documentation

## 2021-12-19 DIAGNOSIS — D696 Thrombocytopenia, unspecified: Secondary | ICD-10-CM | POA: Insufficient documentation

## 2021-12-19 DIAGNOSIS — D500 Iron deficiency anemia secondary to blood loss (chronic): Secondary | ICD-10-CM | POA: Insufficient documentation

## 2021-12-19 DIAGNOSIS — Z95828 Presence of other vascular implants and grafts: Secondary | ICD-10-CM

## 2021-12-19 DIAGNOSIS — C187 Malignant neoplasm of sigmoid colon: Secondary | ICD-10-CM | POA: Insufficient documentation

## 2021-12-19 DIAGNOSIS — E669 Obesity, unspecified: Secondary | ICD-10-CM | POA: Insufficient documentation

## 2021-12-19 MED ORDER — HEPARIN SOD (PORK) LOCK FLUSH 100 UNIT/ML IV SOLN
500.0000 [IU] | Freq: Once | INTRAVENOUS | Status: AC
  Administered 2021-12-19: 500 [IU] via INTRAVENOUS
  Filled 2021-12-19: qty 5

## 2021-12-19 MED ORDER — SODIUM CHLORIDE 0.9% FLUSH
10.0000 mL | Freq: Once | INTRAVENOUS | Status: AC
  Administered 2021-12-19: 10 mL via INTRAVENOUS
  Filled 2021-12-19: qty 10

## 2021-12-23 ENCOUNTER — Ambulatory Visit
Admission: RE | Admit: 2021-12-23 | Discharge: 2021-12-23 | Disposition: A | Discharge status: Home / Self Care | Payer: Self-pay | Source: Ambulatory Visit | Attending: Oncology | Admitting: Oncology

## 2021-12-23 DIAGNOSIS — C189 Malignant neoplasm of colon, unspecified: Secondary | ICD-10-CM | POA: Insufficient documentation

## 2021-12-23 MED ORDER — IOHEXOL 300 MG/ML  SOLN
100.0000 mL | Freq: Once | INTRAMUSCULAR | Status: AC | PRN
  Administered 2021-12-23: 100 mL via INTRAVENOUS

## 2021-12-25 ENCOUNTER — Inpatient Hospital Stay: Payer: Self-pay

## 2021-12-25 ENCOUNTER — Inpatient Hospital Stay (HOSPITAL_BASED_OUTPATIENT_CLINIC_OR_DEPARTMENT_OTHER): Payer: Self-pay | Admitting: Oncology

## 2021-12-25 ENCOUNTER — Encounter: Payer: Self-pay | Admitting: Oncology

## 2021-12-25 VITALS — BP 108/76 | HR 116 | Temp 97.5°F | Resp 18 | Wt 195.3 lb

## 2021-12-25 DIAGNOSIS — D696 Thrombocytopenia, unspecified: Secondary | ICD-10-CM

## 2021-12-25 DIAGNOSIS — E669 Obesity, unspecified: Secondary | ICD-10-CM

## 2021-12-25 DIAGNOSIS — C187 Malignant neoplasm of sigmoid colon: Secondary | ICD-10-CM

## 2021-12-25 DIAGNOSIS — Z95828 Presence of other vascular implants and grafts: Secondary | ICD-10-CM

## 2021-12-25 DIAGNOSIS — C189 Malignant neoplasm of colon, unspecified: Secondary | ICD-10-CM

## 2021-12-25 DIAGNOSIS — G629 Polyneuropathy, unspecified: Secondary | ICD-10-CM

## 2021-12-25 LAB — CBC WITH DIFFERENTIAL/PLATELET
Abs Immature Granulocytes: 0.01 10*3/uL (ref 0.00–0.07)
Basophils Absolute: 0 10*3/uL (ref 0.0–0.1)
Basophils Relative: 1 %
Eosinophils Absolute: 0.1 10*3/uL (ref 0.0–0.5)
Eosinophils Relative: 3 %
HCT: 36.5 % (ref 36.0–46.0)
Hemoglobin: 12.4 g/dL (ref 12.0–15.0)
Immature Granulocytes: 0 %
Lymphocytes Relative: 30 %
Lymphs Abs: 1.3 10*3/uL (ref 0.7–4.0)
MCH: 29.3 pg (ref 26.0–34.0)
MCHC: 34 g/dL (ref 30.0–36.0)
MCV: 86.3 fL (ref 80.0–100.0)
Monocytes Absolute: 0.5 10*3/uL (ref 0.1–1.0)
Monocytes Relative: 10 %
Neutro Abs: 2.4 10*3/uL (ref 1.7–7.7)
Neutrophils Relative %: 56 %
Platelets: 82 10*3/uL — ABNORMAL LOW (ref 150–400)
RBC: 4.23 MIL/uL (ref 3.87–5.11)
RDW: 13.4 % (ref 11.5–15.5)
WBC: 4.3 10*3/uL (ref 4.0–10.5)
nRBC: 0 % (ref 0.0–0.2)

## 2021-12-25 LAB — COMPREHENSIVE METABOLIC PANEL
ALT: 23 U/L (ref 0–44)
AST: 36 U/L (ref 15–41)
Albumin: 4.2 g/dL (ref 3.5–5.0)
Alkaline Phosphatase: 115 U/L (ref 38–126)
Anion gap: 5 (ref 5–15)
BUN: 13 mg/dL (ref 6–20)
CO2: 25 mmol/L (ref 22–32)
Calcium: 8.9 mg/dL (ref 8.9–10.3)
Chloride: 105 mmol/L (ref 98–111)
Creatinine, Ser: 0.45 mg/dL (ref 0.44–1.00)
GFR, Estimated: >60 mL/min (ref >60)
Glucose, Bld: 121 mg/dL — ABNORMAL HIGH (ref 70–99)
Potassium: 4 mmol/L (ref 3.5–5.1)
Sodium: 135 mmol/L (ref 135–145)
Total Bilirubin: 0.7 mg/dL (ref 0.3–1.2)
Total Protein: 7.3 g/dL (ref 6.5–8.1)

## 2021-12-25 MED ORDER — SODIUM CHLORIDE 0.9% FLUSH
10.0000 mL | Freq: Once | INTRAVENOUS | Status: AC
  Administered 2021-12-25: 10 mL via INTRAVENOUS
  Filled 2021-12-25: qty 10

## 2021-12-25 MED ORDER — HEPARIN SOD (PORK) LOCK FLUSH 100 UNIT/ML IV SOLN
500.0000 [IU] | Freq: Once | INTRAVENOUS | Status: AC
  Administered 2021-12-25: 500 [IU] via INTRAVENOUS
  Filled 2021-12-25: qty 5

## 2021-12-25 NOTE — Progress Notes (Signed)
. ? ? ?Hematology/Oncology Progress note ?[phone] [phone] ?  ? ? ?Name of the patient: Debra [name]  ?[id number]  ?October 13, 1965  ?Date of visit: 12/25/21 ? ?PERTINENT ONCOLOGY HISTORY ?Debra [name] is a 56 y.o.afemale who has above oncology history reviewed by me today presented for follow up visit for management of Stage IIIB Colon cancer ?seen by me on 11/21/2019 during her hospitalization. ?Patient has a history of Covid positivity. ?Severe iron deficiency status post IV Venofer treatment. ?Colonoscopy 11/21/2019 showed a malignant appearing severe stenosis found in the sigmoid colon at 25 cm proximal to the anus and was nontransversed.  Biopsy was taken.  Surgical pathology is pending  Nonbleeding external hemorrhoids ?Biopsy was not diagnostic ?01/03/2020 virtual colonoscopy showed long segment area of wall thickening and annular constriction within the sigmoid colon in the area of previous concern most compatible with annular malignancy.  Remainder of the colon evaluation is somewhat limited due to large amount of retained stool and incomplete distention proximal to the annular constricting lesion. ?01/30/2020 ?Patient underwent left hemicolectomy by Dr. Celine Ahr and a biopsy showed invasive adenocarcinoma, poorly differentiated.  37 lymph nodes negative for malignancy.  Grade 3, all margins are uninvolved.  Perineural invasion present.  Tumor deposit present. ?pT3 pN1c cM0 ?Checks x-ray did not show any lung mass. ?#Postop, patient developed postop ileus and she required TPN, PICC and NG tube placement.  Postoperation day 9 she continued to have increased leukocytosis and was subsequently found to have a midline wound infection to the inferior portion of the wound.  Patient was treated with antibiotics. ? ?07/03/2020 CT abdomen pelvis w contrast showed well positioned drainage catheter with apparent resolution of the intra-abdominal abscess. 07/10/2020 seen by [facility] and  patient had drainage catheter removed by IR on 07/12/2020 ? ?09/03/2020-01/14/2021 Adjuvant chemotherapy FOLFOX x 12, bolus 5-FU omitted starting cycle 2. ? ?09/23/2021 CT chest abdomen pelvis with contrast showed 1.4 x 1.4 nodular focus of soft tissue attenuation anterior pelvis near the anastomosis.  Close attention on follow-up.  Small lymph nodes in the ileocolic mesentery are slightly more conspicuous than on the prior study.  Nausea continuation of lungs bilaterally Interval.  Contrast material in the distal esophagus like secondary to reflux. ? ?INTERVAL HISTORY ?Debra [name] is a 56 y.o. female who has above history reviewed by me today presents for follow up visit for management of stage IIIb colon cancer ?She reports chronic numbness of her hands and feet. Not worse.  ?Denies any new complaints today.  She has good appetite.  Weight has been stable.  Denies any nausea vomiting diarrhea, blood in the stool, abdominal pain. ? ?. ? ?Review of systems- Review of Systems  ?Constitutional:  Positive for fatigue. Negative for appetite change, chills, fever and unexpected weight change.  ?HENT:   Negative for hearing loss and voice change.   ?Eyes:  Negative for eye problems.  ?Respiratory:  Negative for chest tightness, cough and shortness of breath.   ?Cardiovascular:  Negative for chest pain.  ?Gastrointestinal:  Negative for abdominal distention, abdominal pain, blood in stool and constipation.  ?Endocrine: Negative for hot flashes.  ?Genitourinary:  Negative for difficulty urinating, dysuria and frequency.   ?Musculoskeletal:  Negative for arthralgias.  ?Skin:  Negative for itching and wound.  ?Neurological:  Positive for numbness. Negative for extremity weakness, headaches and light-headedness.  ?Hematological:  Negative for adenopathy.  ?Psychiatric/Behavioral:  Negative for confusion.   ? ?No Known Allergies ? ?Patient Active Problem List  ?  Diagnosis Date Noted  ? Port-A-Cath in place 12/25/2021  ?  Neuropathy 12/25/2021  ? Obesity (BMI 35.0-39.9 without comorbidity) 12/25/2021  ? Hx of colon cancer, stage I   ? Polyp of colon   ? Encounter for antineoplastic chemotherapy 09/03/2020  ? Encounter for central line placement   ? Loss of weight 02/15/2020  ? Goals of care, counseling/discussion 02/15/2020  ? Malignant neoplasm of sigmoid colon ([place]) 02/15/2020  ? Pressure injury of skin 02/03/2020  ? Bowel obstruction ([place]) 01/29/2020  ? Iron deficiency anemia due to chronic blood loss 11/22/2019  ? COVID-19 virus detected 11/20/2019  ? Symptomatic anemia 11/20/2019  ? Hypothyroidism 11/20/2019  ? Iron deficiency anemia 11/20/2019  ? GIB (gastrointestinal bleeding) 11/20/2019  ? ? ? ?Past Medical History:  ?Diagnosis Date  ? Anemia   ? Colon cancer ([place]) 04/2020  ? Hypothyroidism   ? Thyroid disease   ? ? ? ?Past Surgical History:  ?Procedure Laterality Date  ? COLONOSCOPY WITH PROPOFOL N/A 11/21/2019  ? Procedure: COLONOSCOPY WITH PROPOFOL;  Surgeon: Lin Landsman, MD;  Location: [facility] ENDOSCOPY;  Service: Gastroenterology;  Laterality: N/A;  ? COLONOSCOPY WITH PROPOFOL N/A 07/10/2021  ? Procedure: COLONOSCOPY WITH PROPOFOL;  Surgeon: Lin Landsman, MD;  Location: [facility] ENDOSCOPY;  Service: Gastroenterology;  Laterality: N/A;  ? CYSTOSCOPY WITH STENT PLACEMENT Left 01/30/2020  ? Procedure: CYSTOSCOPY WITH STENT PLACEMENT;  Surgeon: Hollice Espy, MD;  Location: [facility] ORS;  Service: Urology;  Laterality: Left;  ? ESOPHAGOGASTRODUODENOSCOPY (EGD) WITH PROPOFOL N/A 11/21/2019  ? Procedure: ESOPHAGOGASTRODUODENOSCOPY (EGD) WITH PROPOFOL;  Surgeon: Lin Landsman, MD;  Location: [facility] ENDOSCOPY;  Service: Gastroenterology;  Laterality: N/A;  ? IR RADIOLOGIST EVAL & MGMT  07/03/2020  ? IR RADIOLOGIST EVAL & MGMT  07/12/2020  ? PARTIAL COLECTOMY N/A 01/30/2020  ? Procedure: PARTIAL COLECTOMY Sigmoid;  Surgeon: [name], MD;  Location: [facility] ORS;  Service: General;  Laterality: N/A;  ? PORTACATH PLACEMENT  Left 03/28/2020  ? Procedure: INSERTION PORT-A-CATH;  Surgeon: [name], MD;  Location: [facility] ORS;  Service: General;  Laterality: Left;  ? PORTACATH PLACEMENT N/A 08/29/2020  ? Procedure: INSERTION PORT-A-CATH, chemotherapy;  Surgeon: [name], MD;  Location: [facility] ORS;  Service: General;  Laterality: N/A;  ? ? ?Social History  ? ?Socioeconomic History  ? Marital status: Single  ?  Spouse name: Not on file  ? Number of children: Not on file  ? Years of education: Not on file  ? Highest education level: Not on file  ?Occupational History  ? Occupation: Factory  ?Tobacco Use  ? Smoking status: Never  ? Smokeless tobacco: Never  ?Vaping Use  ? Vaping Use: Never used  ?Substance and Sexual Activity  ? Alcohol use: Never  ? Drug use: Never  ? Sexual activity: Not on file  ?Other Topics Concern  ? Not on file  ?Social History Narrative  ? Lives at home with significant other  ? ?Social Determinants of Health  ? ?Financial Resource Strain: Not on file  ?Food Insecurity: Not on file  ?Transportation Needs: Not on file  ?Physical Activity: Not on file  ?Stress: Not on file  ?Social Connections: Not on file  ?Intimate Partner Violence: Not on file  ? ?  ?Family History  ?Problem Relation Age of Onset  ? Diabetes Sister   ? ? ? ?Current Outpatient Medications:  ?  docusate sodium (COLACE) 100 MG capsule, Take 1 capsule (100 mg total) by mouth daily., Disp: 30 capsule, Rfl: 1 ?  levothyroxine (SYNTHROID) 137 MCG tablet, Take 137 mcg by mouth daily before breakfast. , Disp: , Rfl:  ?  lidocaine-prilocaine (EMLA) cream, Apply to affected area once, Disp: 30 g, Rfl: 3 ?  Multiple Vitamin (MULTIVITAMIN) tablet, Take 1 tablet by mouth daily., Disp: , Rfl:  ?  gabapentin (NEURONTIN) 100 MG capsule, Take 1 capsule (100 mg total) by mouth at bedtime. (Patient not taking: Reported on 12/25/2021), Disp: 90 capsule, Rfl: 1 ?  ondansetron (ZOFRAN) 8 MG tablet, Take 1 tablet (8 mg total) by mouth 2 (two) times daily as needed  for refractory nausea / vomiting. Start on day 3 after chemotherapy. (Patient not taking: Reported on 09/26/2021), Disp: 30 tablet, Rfl: 1 ?  polyethylene glycol (MIRALAX) 17 g packet, Take 17 g by mouth da

## 2021-12-25 NOTE — Progress Notes (Signed)
Pt here for follow up. No new concerns voiced.   

## 2021-12-26 LAB — CEA: CEA: 7 ng/mL — ABNORMAL HIGH (ref 0.0–4.7)

## 2021-12-27 ENCOUNTER — Encounter (HOSPITAL_COMMUNITY)
Admission: RE | Admit: 2021-12-27 | Discharge: 2021-12-27 | Disposition: A | Discharge status: Home / Self Care | Payer: Self-pay | Source: Ambulatory Visit | Attending: Oncology | Admitting: Oncology

## 2021-12-27 DIAGNOSIS — C187 Malignant neoplasm of sigmoid colon: Secondary | ICD-10-CM | POA: Insufficient documentation

## 2021-12-27 LAB — GLUCOSE, CAPILLARY: Glucose-Capillary: 95 mg/dL (ref 70–99)

## 2021-12-27 MED ORDER — FLUDEOXYGLUCOSE F - 18 (FDG) INJECTION
10.0000 | Freq: Once | INTRAVENOUS | Status: AC
  Administered 2021-12-27: 9.74 via INTRAVENOUS

## 2022-01-03 ENCOUNTER — Telehealth: Payer: Self-pay

## 2022-01-03 NOTE — Telephone Encounter (Signed)
Pt informed of appts and appt reminder mailed also .  ?

## 2022-01-03 NOTE — Telephone Encounter (Signed)
-----   Message from Earlie Server, MD sent at 01/03/2022  9:02 AM EDT ----- ?Regarding: FW: ?See below. Please refer to Dr.Pabon. let her know that I will explain details during her visit.  ?Please arrange her to do a follow up MD only appt to review results  ? ? ?----- Message ----- ?From: Jules Husbands, MD ?Sent: 01/03/2022   7:21 AM EDT ?To: Earlie Server, MD ?Subject: RE:                                           ? ?yes happy to ?----- Message ----- ?From: Earlie Server, MD ?Sent: 01/02/2022  11:32 PM EDT ?To: Jules Husbands, MD, Evelina Dun, RN ? ?Dr.Pabon, ?Patient's recent labs and images are indicating local recurrence. C scope 6 months ago was normal.  ?Would you be able to take over her case since Dr.Cannon left the practice? She maybe benefit from salvage surgery as PET showed no other metastasis disease.  ?Please let me know. Thanks.  ? ?Talbert Cage  ? ? ? ?

## 2022-01-03 NOTE — Telephone Encounter (Signed)
Please schedule pt for MD only in approx 2 weeks. I will call to inform pt of appts once scheduled. Thanks  ?

## 2022-01-17 ENCOUNTER — Inpatient Hospital Stay: Payer: Self-pay | Attending: Oncology | Admitting: Oncology

## 2022-01-17 ENCOUNTER — Encounter: Payer: Self-pay | Admitting: Oncology

## 2022-01-17 VITALS — BP 116/73 | HR 65 | Temp 97.2°F | Resp 18 | Wt 196.4 lb

## 2022-01-17 DIAGNOSIS — E669 Obesity, unspecified: Secondary | ICD-10-CM

## 2022-01-17 DIAGNOSIS — G629 Polyneuropathy, unspecified: Secondary | ICD-10-CM | POA: Insufficient documentation

## 2022-01-17 DIAGNOSIS — Z95828 Presence of other vascular implants and grafts: Secondary | ICD-10-CM

## 2022-01-17 DIAGNOSIS — D696 Thrombocytopenia, unspecified: Secondary | ICD-10-CM

## 2022-01-17 DIAGNOSIS — D6959 Other secondary thrombocytopenia: Secondary | ICD-10-CM | POA: Insufficient documentation

## 2022-01-17 DIAGNOSIS — C187 Malignant neoplasm of sigmoid colon: Secondary | ICD-10-CM | POA: Insufficient documentation

## 2022-01-17 NOTE — Progress Notes (Signed)
Patient here for follow up. No new concerns voiced.  °

## 2022-01-17 NOTE — Progress Notes (Signed)
. ? ? ?Hematology/Oncology Progress note ?Telephone:([phone]) [phone] [phone] ?  ? ? ?Name of the patient: Debra [name]  ?[id number]  ?[birth year]  ?Date of visit: 01/17/22 ? ?PERTINENT ONCOLOGY HISTORY ?Debra [name] is a 56 y.o.afemale who has above oncology history reviewed by me today presented for follow up visit for management of Stage IIIB Colon cancer ?seen by me on 11/21/2019 during her hospitalization. ?Patient has a history of Covid positivity. ?Severe iron deficiency status post IV Venofer treatment. ?Colonoscopy 11/21/2019 showed a malignant appearing severe stenosis found in the sigmoid colon at 25 cm proximal to the anus and was nontransversed.  Biopsy was taken.  Surgical pathology is pending  Nonbleeding external hemorrhoids ?Biopsy was not diagnostic ?01/03/2020 virtual colonoscopy showed long segment area of wall thickening and annular constriction within the sigmoid colon in the area of previous concern most compatible with annular malignancy.  Remainder of the colon evaluation is somewhat limited due to large amount of retained stool and incomplete distention proximal to the annular constricting lesion. ?01/30/2020 ?Patient underwent left hemicolectomy by Dr. Celine Ahr and a biopsy showed invasive adenocarcinoma, poorly differentiated.  37 lymph nodes negative for malignancy.  Grade 3, all margins are uninvolved.  Perineural invasion present.  Tumor deposit present. ?pT3 pN1c cM0 ?Checks x-ray did not show any lung mass. ?#Postop, patient developed postop ileus and she required TPN, PICC and NG tube placement.  Postoperation day 9 she continued to have increased leukocytosis and was subsequently found to have a midline wound infection to the inferior portion of the wound.  Patient was treated with antibiotics. ? ?07/03/2020 CT abdomen pelvis w contrast showed well positioned drainage catheter with apparent resolution of the intra-abdominal abscess. 07/10/2020 seen by [facility] and  patient had drainage catheter removed by IR on 07/12/2020 ? ?09/03/2020-01/14/2021 Adjuvant chemotherapy FOLFOX x 12, bolus 5-FU omitted starting cycle 2. ? ?09/23/2021 CT chest abdomen pelvis with contrast showed 1.4 x 1.4 nodular focus of soft tissue attenuation anterior pelvis near the anastomosis.  Close attention on follow-up.  Small lymph nodes in the ileocolic mesentery are slightly more conspicuous than on the prior study.  Nausea continuation of lungs bilaterally Interval.  Contrast material in the distal esophagus like secondary to reflux. ? ?INTERVAL HISTORY ?Debra [name] is a 56 y.o. female who has above history reviewed by me today presents for follow up visit for management of stage IIIb colon cancer ?She reports chronic numbness of her hands and feet. Not worse.  ? ?#12/23/2021, CT chest abdomen pelvis with contrast showed no evidence of thoracic metastasis.  Gastroesophageal reflux.  Ventral paratonia nodularity increased in size comparing to study on 09/23/2021.  No visceral metastasis.  No lymphadenopathy. ?Images were independently reviewed by me and discussed with patient. ?#12/25/2021, CEA increased to 7 ?#PET scan showed 19 x 16 mm soft tissue nodule of concern in the anterior pelvis adjacent to the anastomosis, markedly hypermetabolic.  Highly concerning for metastatic disease.  No additional sites of expected or suspicious soft tissue hypermetabolism to suggest other sites of recurrence or metastatic disease. ?Patient presents to discuss management plan. ? ?Review of systems- Review of Systems  ?Constitutional:  Positive for fatigue. Negative for appetite change, chills, fever and unexpected weight change.  ?HENT:   Negative for hearing loss and voice change.   ?Eyes:  Negative for eye problems.  ?Respiratory:  Negative for chest tightness, cough and shortness of breath.   ?Cardiovascular:  Negative for chest pain.  ?Gastrointestinal:  Negative for abdominal  distention, abdominal pain, blood  in stool and constipation.  ?Endocrine: Negative for hot flashes.  ?Genitourinary:  Negative for difficulty urinating, dysuria and frequency.   ?Musculoskeletal:  Negative for arthralgias.  ?Skin:  Negative for itching and wound.  ?Neurological:  Positive for numbness. Negative for extremity weakness, headaches and light-headedness.  ?Hematological:  Negative for adenopathy.  ?Psychiatric/Behavioral:  Negative for confusion.   ? ?No Known Allergies ? ?Patient Active Problem List  ? Diagnosis Date Noted  ? Port-A-Cath in place 12/25/2021  ? Neuropathy 12/25/2021  ? Obesity (BMI 35.0-39.9 without comorbidity) 12/25/2021  ? Hx of colon cancer, stage I   ? Polyp of colon   ? Encounter for antineoplastic chemotherapy 09/03/2020  ? Encounter for central line placement   ? Loss of weight 02/15/2020  ? Goals of care, counseling/discussion 02/15/2020  ? Malignant neoplasm of sigmoid colon ([place]) 02/15/2020  ? Pressure injury of skin 02/03/2020  ? Bowel obstruction ([place]) 01/29/2020  ? Iron deficiency anemia due to chronic blood loss 11/22/2019  ? COVID-19 virus detected 11/20/2019  ? Symptomatic anemia 11/20/2019  ? Hypothyroidism 11/20/2019  ? Iron deficiency anemia 11/20/2019  ? GIB (gastrointestinal bleeding) 11/20/2019  ? ? ? ?Past Medical History:  ?Diagnosis Date  ? Anemia   ? Colon cancer ([place]) 04/2020  ? Hypothyroidism   ? Thyroid disease   ? ? ? ?Past Surgical History:  ?Procedure Laterality Date  ? COLONOSCOPY WITH PROPOFOL N/A 11/21/2019  ? Procedure: COLONOSCOPY WITH PROPOFOL;  Surgeon: Lin Landsman, MD;  Location: [facility] ENDOSCOPY;  Service: Gastroenterology;  Laterality: N/A;  ? COLONOSCOPY WITH PROPOFOL N/A 07/10/2021  ? Procedure: COLONOSCOPY WITH PROPOFOL;  Surgeon: Lin Landsman, MD;  Location: [facility] ENDOSCOPY;  Service: Gastroenterology;  Laterality: N/A;  ? CYSTOSCOPY WITH STENT PLACEMENT Left 01/30/2020  ? Procedure: CYSTOSCOPY WITH STENT PLACEMENT;  Surgeon: Hollice Espy, MD;  Location: [facility]  ORS;  Service: Urology;  Laterality: Left;  ? ESOPHAGOGASTRODUODENOSCOPY (EGD) WITH PROPOFOL N/A 11/21/2019  ? Procedure: ESOPHAGOGASTRODUODENOSCOPY (EGD) WITH PROPOFOL;  Surgeon: Lin Landsman, MD;  Location: [facility] ENDOSCOPY;  Service: Gastroenterology;  Laterality: N/A;  ? IR RADIOLOGIST EVAL & MGMT  07/03/2020  ? IR RADIOLOGIST EVAL & MGMT  07/12/2020  ? PARTIAL COLECTOMY N/A 01/30/2020  ? Procedure: PARTIAL COLECTOMY Sigmoid;  Surgeon: [name], MD;  Location: [facility] ORS;  Service: General;  Laterality: N/A;  ? PORTACATH PLACEMENT Left 03/28/2020  ? Procedure: INSERTION PORT-A-CATH;  Surgeon: [name], MD;  Location: [facility] ORS;  Service: General;  Laterality: Left;  ? PORTACATH PLACEMENT N/A 08/29/2020  ? Procedure: INSERTION PORT-A-CATH, chemotherapy;  Surgeon: [name], MD;  Location: [facility] ORS;  Service: General;  Laterality: N/A;  ? ? ?Social History  ? ?Socioeconomic History  ? Marital status: Single  ?  Spouse name: Not on file  ? Number of children: Not on file  ? Years of education: Not on file  ? Highest education level: Not on file  ?Occupational History  ? Occupation: Factory  ?Tobacco Use  ? Smoking status: Never  ? Smokeless tobacco: Never  ?Vaping Use  ? Vaping Use: Never used  ?Substance and Sexual Activity  ? Alcohol use: Never  ? Drug use: Never  ? Sexual activity: Not on file  ?Other Topics Concern  ? Not on file  ?Social History Narrative  ? Lives at home with significant other  ? ?Social Determinants of Health  ? ?Financial Resource Strain: Not on file  ?Food Insecurity: Not on file  ?Transportation  Needs: Not on file  ?Physical Activity: Not on file  ?Stress: Not on file  ?Social Connections: Not on file  ?Intimate Partner Violence: Not on file  ? ?  ?Family History  ?Problem Relation Age of Onset  ? Diabetes Sister   ? ? ? ?Current Outpatient Medications:  ?  levothyroxine (SYNTHROID) 137 MCG tablet, Take 137 mcg by mouth daily before breakfast. , Disp: , Rfl:  ?   lidocaine-prilocaine (EMLA) cream, Apply to affected area once, Disp: 30 g, Rfl: 3 ?  vitamin B-12 (CYANOCOBALAMIN) 1000 MCG tablet, Take 1,000 mcg by mouth daily., Disp: , Rfl:  ?  docusate sodium (COLACE) 10

## 2022-01-21 IMAGING — DX DG CHEST 1V PORT
1 series · 1 of 1 positions shown · non-contrast
Comparison: Portable exam 1071 hours compared to 03/28/2020

CLINICAL DATA: Post Port-A-Cath insertion

EXAM:
PORTABLE CHEST 1 VIEW

[chest ap]
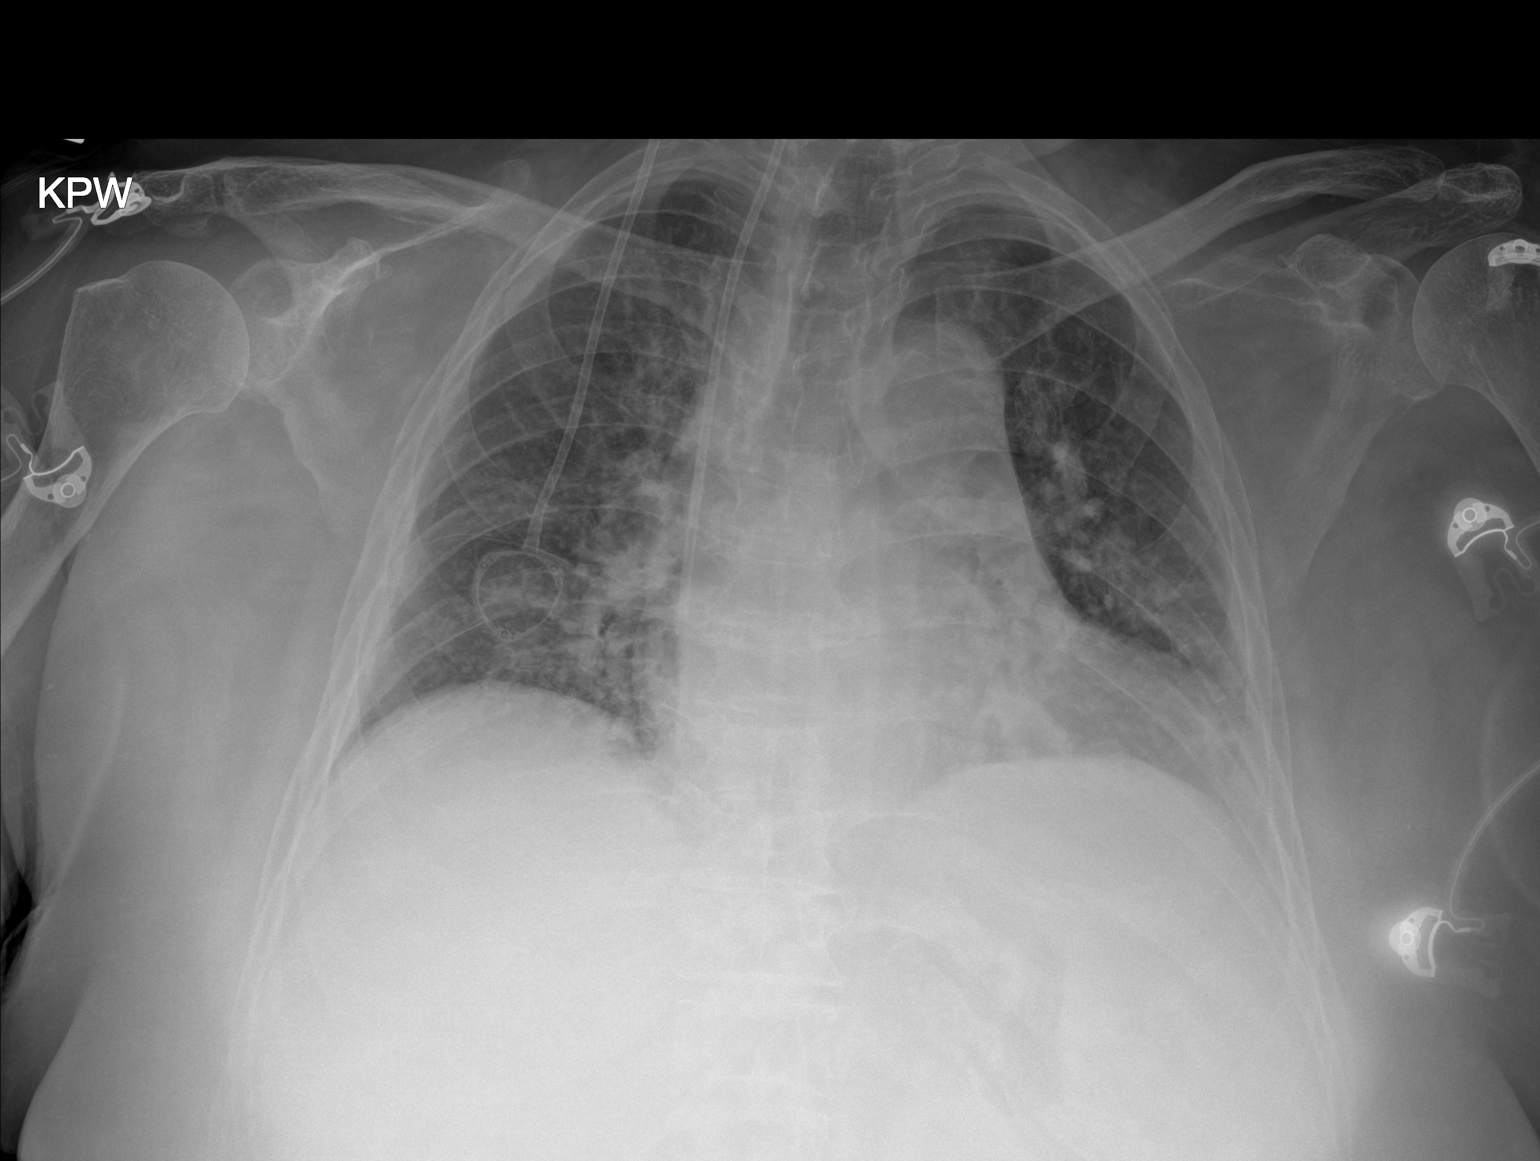

[1 of 1 positions shown; findings below may reference images not displayed]

FINDINGS: RIGHT jugular Port-A-Cath with tip projecting over cavoatrial
junction.

Enlargement of cardiac silhouette with vascular congestion.

Mediastinal contours normal for slight degree of rotation to the
LEFT.

Decreased lung volumes with mild bibasilar atelectasis.

Cannot exclude minimal perihilar infiltrate.

No pleural effusion or pneumothorax.

Bones unremarkable.
IMPRESSION: No pneumothorax following RIGHT jugular Port-A-Cath insertion.

Mild bibasilar atelectasis and question minimal perihilar
infiltrate.

## 2022-01-22 ENCOUNTER — Other Ambulatory Visit: Payer: Self-pay

## 2022-01-22 ENCOUNTER — Ambulatory Visit (INDEPENDENT_AMBULATORY_CARE_PROVIDER_SITE_OTHER): Payer: Self-pay | Admitting: Surgery

## 2022-01-22 ENCOUNTER — Encounter: Payer: Self-pay | Admitting: Surgery

## 2022-01-22 VITALS — BP 140/82 | HR 70 | Temp 98.2°F | Ht 59.0 in | Wt 196.2 lb

## 2022-01-22 DIAGNOSIS — C187 Malignant neoplasm of sigmoid colon: Secondary | ICD-10-CM

## 2022-01-22 DIAGNOSIS — Z85038 Personal history of other malignant neoplasm of large intestine: Secondary | ICD-10-CM

## 2022-01-22 MED ORDER — GOLYTELY 236 G PO SOLR: 4000.0000 mL | Freq: Once | ORAL | 0 refills | Status: AC

## 2022-01-22 NOTE — Progress Notes (Signed)
Patient ID: Debra [name], female   DOB: 1966/06/28, 56 y.o.   MRN: [id number] ?  ?HPI ?Le Debra [name] is a 56 y.o. female seen in consultation at the request of Dr. 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 Celine Ahr 2 years ago.  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 ?She has been seen by Dr. Tasia Catchings and she did not require adjuvant chemotherapy.  Her CEA level started to increase and the last one was 7.0.  Underwent further PET/CT that I have personally reviewed showing evidence of hyper active mass on the anterior pelvis next to the anastomosis.  There is no evidence of anastomotic leak. ?He is currently able to perform more than 4 METS of activity without any shortness of breath or chest pain.  She is eating regular diet and having bowel movements. ?She is here with her daughter ?She does have thrombocytopenia and platelets are 82,000.,  Hemoglobin is 12.4.  CMP is completely normal ?HPI ?  ?    ?Past Medical History:  ?Diagnosis Date  ? Anemia    ? Colon cancer ([place]) 04/2020  ? Hypothyroidism    ? Thyroid disease    ?  ?  ?     ?Past Surgical History:  ?Procedure Laterality Date  ? COLONOSCOPY WITH PROPOFOL N/A 11/21/2019  ?  Procedure: COLONOSCOPY WITH PROPOFOL;  Surgeon: Lin Landsman, MD;  Location: [facility] ENDOSCOPY;  Service: Gastroenterology;  Laterality: N/A;  ? COLONOSCOPY WITH PROPOFOL N/A 07/10/2021  ?  Procedure: COLONOSCOPY WITH PROPOFOL;  Surgeon: Lin Landsman, MD;  Location: [facility] ENDOSCOPY;  Service: Gastroenterology;  Laterality: N/A;  ? CYSTOSCOPY WITH STENT PLACEMENT Left 01/30/2020  ?  Procedure: CYSTOSCOPY WITH STENT PLACEMENT;  Surgeon: Hollice Espy, MD;  Location: [facility] ORS;  Service: Urology;  Laterality: Left;  ? ESOPHAGOGASTRODUODENOSCOPY (EGD) WITH PROPOFOL N/A 11/21/2019  ?  Procedure: ESOPHAGOGASTRODUODENOSCOPY (EGD) WITH PROPOFOL;  Surgeon:  Lin Landsman, MD;  Location: [facility] ENDOSCOPY;  Service: Gastroenterology;  Laterality: N/A;  ? IR RADIOLOGIST EVAL & MGMT   07/03/2020  ? IR RADIOLOGIST EVAL & MGMT   07/12/2020  ? PARTIAL COLECTOMY N/A 01/30/2020  ?  Procedure: PARTIAL COLECTOMY Sigmoid;  Surgeon: [name], MD;  Location: [facility] ORS;  Service: General;  Laterality: N/A;  ? PORTACATH PLACEMENT Left 03/28/2020  ?  Procedure: INSERTION PORT-A-CATH;  Surgeon: [name], MD;  Location: [facility] ORS;  Service: General;  Laterality: Left;  ? PORTACATH PLACEMENT N/A 08/29/2020  ?  Procedure: INSERTION PORT-A-CATH, chemotherapy;  Surgeon: [name], MD;  Location: [facility] ORS;  Service: General;  Laterality: N/A;  ?  ?  ?     ?Family History  ?Problem Relation Age of Onset  ? Diabetes Sister    ?  ?  ?Social History ?Social History  ?  ?    ?Tobacco Use  ? Smoking status: Never  ? Smokeless tobacco: Never  ?Vaping Use  ? Vaping Use: Never used  ?Substance Use Topics  ? Alcohol use: Never  ? Drug use: Never  ?  ?  ?No Known Allergies ?  ?      ?Current Outpatient Medications  ?Medication Sig Dispense Refill  ? docusate sodium (COLACE) 100 MG capsule Take 1 capsule (100 mg total) by mouth daily. 30 capsule 1  ? levothyroxine (SYNTHROID) 137 MCG tablet Take 137 mcg by mouth daily before breakfast.       ?  lidocaine-prilocaine (EMLA) cream Apply to affected area once 30 g 3  ? ondansetron (ZOFRAN) 8 MG tablet Take 1 tablet (8 mg total) by mouth 2 (two) times daily as needed for refractory nausea / vomiting. Start on day 3 after chemotherapy. 30 tablet 1  ? polyethylene glycol (GOLYTELY) 236 g solution Take 4,000 mLs by mouth once for 1 dose. 4000 mL 0  ? polyethylene glycol (MIRALAX) 17 g packet Take 17 g by mouth daily as needed. 14 each 0  ? prochlorperazine (COMPAZINE) 10 MG tablet Take 1 tablet (10 mg total) by mouth every 6 (six) hours as needed (Nausea or vomiting). 30 tablet 1  ?  ?No current facility-administered medications for this  visit.  ?  ?  ?  ?Review of Systems ?Full ROS  was asked and was negative except for the information on the HPI ?  ?Physical Exam ?. ?CONSTITUTIONAL: NAD. ?EYES: Pupils are equal, round,, Sclera are non-icteric. ?EARS, NOSE, MOUTH AND THROAT: The oropharynx is clear. The oral mucosa is pink and moist. Hearing is intact to voice. ?LYMPH NODES:  Lymph nodes in the neck are normal. ?RESPIRATORY:  Lungs are clear. There is normal respiratory effort, with equal breath sounds bilaterally, and without pathologic use of accessory muscles. Port in place w/o infection ?CARDIOVASCULAR: Heart is regular without murmurs, gallops, or rubs. ?GI: The abdomen is  soft, nontender, and nondistended. There are no palpable masses. There is no hepatosplenomegaly. There are normal bowel sounds. ?Evidence of prior midline laparotomy scar with about 3 cm incisional hernia around the umbilicus.  It is reducible.   ?GU: Rectal deferred.   ?MUSCULOSKELETAL: Normal muscle strength and tone. No cyanosis or edema.   ?SKIN: Turgor is good and there are no pathologic skin lesions or ulcers. ?NEUROLOGIC: Motor and sensation is grossly normal. Cranial nerves are grossly intact. ?PSYCH:  Oriented to person, place and time. Affect is normal. ?  ?Data Reviewed ?  ?I have personally reviewed the patient's imaging, laboratory findings and medical records.   ?  ?Assessment/Plan ?  ?56 year old female with a history of significant cancer status post sigmoid colectomy 2 years ago now with recurrent disease within the anterior pelvis next to the anastomosis.  There is no evidence of any distant metastatic disease.  I do think that we need to perform a resection of the sigmoid colon and adjacent metastatic mass.  I do think that we will have to perform this open to obtain a good R0 resection as the mass seems to be just outside the anastomosis.  I will make sure that I am able to perform a good resection and be able to feel any potential metastatic  implants. ?I will be repairing the incisional hernia at this time but not placing any mesh due to significant risk factors and potential contamination ?Marland Kitchen  Procedure discussed with the patient and the family.  Risks, benefits and possible implications including but not limited to, bleeding, infection injury to adjacent structures.  I will also arrange for colonoscopy and we will place her on the schedule in the next following weeks.  She will need antibiotic preparation as well as mechanical preparation. ?This note I spent greater than 55 minutes in this encounter including personally reviewing medical records, imaging studies, placing orders, coordinating her care and performing appropriate mentation. ?A copy of this report was sent to the referring provider ?I will see her back in a couple weeks after she completes her colonoscopy perform additional counseling regarding surgical intervention ?  ?  Caroleen Hamman, MD FACS ?General Surgeon ?01/22/2022, 3:10 PM ?  ?   ?  ?  ?  ? ? ?

## 2022-01-22 NOTE — Patient Instructions (Addendum)
Colonoscopy scheduled 02/06/2022. Please call [facility] [phone].  ?Our surgery scheduler will call you within 24-48 hours to schedule your surgery. Please have the Pink surgery sheet available when speaking with her. ?

## 2022-01-22 NOTE — Consult Note (Signed)
Patient ID: Debra [name], female   DOB: 1966/07/31, 56 y.o.   MRN: [id number] ? ?HPI ?Debra [name] is a 56 y.o. female seen in consultation at the request of Dr. 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 Celine Ahr 2 years ago.  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 ?She has been seen by Dr. Tasia Catchings and she did not require adjuvant chemotherapy.  Her CEA level started to increase and the last one was 7.0.  Underwent further PET/CT that I have personally reviewed showing evidence of hyper active mass on the anterior pelvis next to the anastomosis.  There is no evidence of anastomotic leak. ?He is currently able to perform more than 4 METS of activity without any shortness of breath or chest pain.  She is eating regular diet and having bowel movements. ?She is here with her daughter ?She does have thrombocytopenia and platelets are 82,000.,  Hemoglobin is 12.4.  CMP is completely normal ?HPI ? ?Past Medical History:  ?Diagnosis Date  ? Anemia   ? Colon cancer ([place]) 04/2020  ? Hypothyroidism   ? Thyroid disease   ? ? ?Past Surgical History:  ?Procedure Laterality Date  ? COLONOSCOPY WITH PROPOFOL N/A 11/21/2019  ? Procedure: COLONOSCOPY WITH PROPOFOL;  Surgeon: Lin Landsman, MD;  Location: [facility] ENDOSCOPY;  Service: Gastroenterology;  Laterality: N/A;  ? COLONOSCOPY WITH PROPOFOL N/A 07/10/2021  ? Procedure: COLONOSCOPY WITH PROPOFOL;  Surgeon: Lin Landsman, MD;  Location: [facility] ENDOSCOPY;  Service: Gastroenterology;  Laterality: N/A;  ? CYSTOSCOPY WITH STENT PLACEMENT Left 01/30/2020  ? Procedure: CYSTOSCOPY WITH STENT PLACEMENT;  Surgeon: Hollice Espy, MD;  Location: [facility] ORS;  Service: Urology;  Laterality: Left;  ? ESOPHAGOGASTRODUODENOSCOPY (EGD) WITH PROPOFOL N/A 11/21/2019  ? Procedure: ESOPHAGOGASTRODUODENOSCOPY (EGD) WITH PROPOFOL;  Surgeon: Lin Landsman, MD;   Location: [facility] ENDOSCOPY;  Service: Gastroenterology;  Laterality: N/A;  ? IR RADIOLOGIST EVAL & MGMT  07/03/2020  ? IR RADIOLOGIST EVAL & MGMT  07/12/2020  ? PARTIAL COLECTOMY N/A 01/30/2020  ? Procedure: PARTIAL COLECTOMY Sigmoid;  Surgeon: [name], MD;  Location: [facility] ORS;  Service: General;  Laterality: N/A;  ? PORTACATH PLACEMENT Left 03/28/2020  ? Procedure: INSERTION PORT-A-CATH;  Surgeon: [name], MD;  Location: [facility] ORS;  Service: General;  Laterality: Left;  ? PORTACATH PLACEMENT N/A 08/29/2020  ? Procedure: INSERTION PORT-A-CATH, chemotherapy;  Surgeon: [name], MD;  Location: [facility] ORS;  Service: General;  Laterality: N/A;  ? ? ?Family History  ?Problem Relation Age of Onset  ? Diabetes Sister   ? ? ?Social History ?Social History  ? ?Tobacco Use  ? Smoking status: Never  ? Smokeless tobacco: Never  ?Vaping Use  ? Vaping Use: Never used  ?Substance Use Topics  ? Alcohol use: Never  ? Drug use: Never  ? ? ?No Known Allergies ? ?Current Outpatient Medications  ?Medication Sig Dispense Refill  ? docusate sodium (COLACE) 100 MG capsule Take 1 capsule (100 mg total) by mouth daily. 30 capsule 1  ? levothyroxine (SYNTHROID) 137 MCG tablet Take 137 mcg by mouth daily before breakfast.     ? lidocaine-prilocaine (EMLA) cream Apply to affected area once 30 g 3  ? ondansetron (ZOFRAN) 8 MG tablet Take 1 tablet (8 mg total) by mouth 2 (two) times daily as needed for refractory nausea / vomiting. Start on day 3 after chemotherapy. 30 tablet 1  ? polyethylene  glycol (GOLYTELY) 236 g solution Take 4,000 mLs by mouth once for 1 dose. 4000 mL 0  ? polyethylene glycol (MIRALAX) 17 g packet Take 17 g by mouth daily as needed. 14 each 0  ? prochlorperazine (COMPAZINE) 10 MG tablet Take 1 tablet (10 mg total) by mouth every 6 (six) hours as needed (Nausea or vomiting). 30 tablet 1  ? ?No current facility-administered medications for this visit.  ? ? ? ?Review of Systems ?Full ROS  was asked and was  negative except for the information on the HPI ? ?Physical Exam ?There were no vitals taken for this visit. ?CONSTITUTIONAL: NAD. ?EYES: Pupils are equal, round, and reactive to light, Sclera are non-icteric. ?EARS, NOSE, MOUTH AND THROAT: The oropharynx is clear. The oral mucosa is pink and moist. Hearing is intact to voice. ?LYMPH NODES:  Lymph nodes in the neck are normal. ?RESPIRATORY:  Lungs are clear. There is normal respiratory effort, with equal breath sounds bilaterally, and without pathologic use of accessory muscles. Port in place w/o infection ?CARDIOVASCULAR: Heart is regular without murmurs, gallops, or rubs. ?GI: The abdomen is  soft, nontender, and nondistended. There are no palpable masses. There is no hepatosplenomegaly. There are normal bowel sounds. ?Evidence of prior midline laparotomy scar with about 3 cm incisional hernia around the umbilicus.  It is reducible.   ?GU: Rectal deferred.   ?MUSCULOSKELETAL: Normal muscle strength and tone. No cyanosis or edema.   ?SKIN: Turgor is good and there are no pathologic skin lesions or ulcers. ?NEUROLOGIC: Motor and sensation is grossly normal. Cranial nerves are grossly intact. ?PSYCH:  Oriented to person, place and time. Affect is normal. ? ?Data Reviewed ? ?I have personally reviewed the patient's imaging, laboratory findings and medical records.   ? ?Assessment/Plan ? ?56 year old female with a history of significant cancer status post sigmoid colectomy 2 years ago now with recurrent disease within the anterior pelvis next to the anastomosis.  There is no evidence of any distant metastatic disease.  I do think that we need to perform a resection of the sigmoid colon and adjacent metastatic mass.  I do think that we will have to perform this open to obtain a good R0 resection as the mass seems to be just outside the anastomosis.  I will make sure that I am able to perform a good resection and be able to feel any potential metastatic implants. ?I will  be repairing the incisional hernia at this time but not placing any mesh due to significant risk factors and potential contamination ?Marland Kitchen  Procedure discussed with the patient and the family.  Risks, benefits and possible implications including but not limited to, bleeding, infection injury to adjacent structures.  I will also arrange for colonoscopy and we will place her on the schedule in the next following weeks.  She will need antibiotic preparation as well as mechanical preparation. ?This note I spent greater than 55 minutes in this encounter including personally reviewing medical records, imaging studies, placing orders, coordinating her care and performing appropriate mentation. ?A copy of this report was sent to the referring provider ?I will see her back in a couple weeks after she completes her colonoscopy perform additional counseling regarding surgical intervention ? ?Caroleen Hamman, MD FACS ?General Surgeon ?01/22/2022, 3:10 PM ? ?  ?

## 2022-01-22 NOTE — Progress Notes (Signed)
Patient ID: Debra [name], female   DOB: 1965/11/29, 56 y.o.   MRN: [id number] ?  ?HPI ?Debra [name] is a 55 y.o. female seen in consultation at the request of Dr. 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 Celine Ahr 2 years ago.  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 ?She has been seen by Dr. Tasia Catchings and she did not require adjuvant chemotherapy.  Her CEA level started to increase and the last one was 7.0.  Underwent further PET/CT that I have personally reviewed showing evidence of hyper active mass on the anterior pelvis next to the anastomosis.  There is no evidence of anastomotic leak. ?He is currently able to perform more than 4 METS of activity without any shortness of breath or chest pain.  She is eating regular diet and having bowel movements. ?She is here with her daughter ?She does have thrombocytopenia and platelets are 82,000.,  Hemoglobin is 12.4.  CMP is completely normal ?HPI ?  ?    ?Past Medical History:  ?Diagnosis Date  ? Anemia    ? Colon cancer ([place]) 04/2020  ? Hypothyroidism    ? Thyroid disease    ?  ?  ?     ?Past Surgical History:  ?Procedure Laterality Date  ? COLONOSCOPY WITH PROPOFOL N/A 11/21/2019  ?  Procedure: COLONOSCOPY WITH PROPOFOL;  Surgeon: Lin Landsman, MD;  Location: [facility] ENDOSCOPY;  Service: Gastroenterology;  Laterality: N/A;  ? COLONOSCOPY WITH PROPOFOL N/A 07/10/2021  ?  Procedure: COLONOSCOPY WITH PROPOFOL;  Surgeon: Lin Landsman, MD;  Location: [facility] ENDOSCOPY;  Service: Gastroenterology;  Laterality: N/A;  ? CYSTOSCOPY WITH STENT PLACEMENT Left 01/30/2020  ?  Procedure: CYSTOSCOPY WITH STENT PLACEMENT;  Surgeon: Hollice Espy, MD;  Location: [facility] ORS;  Service: Urology;  Laterality: Left;  ? ESOPHAGOGASTRODUODENOSCOPY (EGD) WITH PROPOFOL N/A 11/21/2019  ?  Procedure: ESOPHAGOGASTRODUODENOSCOPY (EGD) WITH PROPOFOL;  Surgeon:  Lin Landsman, MD;  Location: [facility] ENDOSCOPY;  Service: Gastroenterology;  Laterality: N/A;  ? IR RADIOLOGIST EVAL & MGMT   07/03/2020  ? IR RADIOLOGIST EVAL & MGMT   07/12/2020  ? PARTIAL COLECTOMY N/A 01/30/2020  ?  Procedure: PARTIAL COLECTOMY Sigmoid;  Surgeon: [name], MD;  Location: [facility] ORS;  Service: General;  Laterality: N/A;  ? PORTACATH PLACEMENT Left 03/28/2020  ?  Procedure: INSERTION PORT-A-CATH;  Surgeon: [name], MD;  Location: [facility] ORS;  Service: General;  Laterality: Left;  ? PORTACATH PLACEMENT N/A 08/29/2020  ?  Procedure: INSERTION PORT-A-CATH, chemotherapy;  Surgeon: [name], MD;  Location: [facility] ORS;  Service: General;  Laterality: N/A;  ?  ?  ?     ?Family History  ?Problem Relation Age of Onset  ? Diabetes Sister    ?  ?  ?Social History ?Social History  ?  ?    ?Tobacco Use  ? Smoking status: Never  ? Smokeless tobacco: Never  ?Vaping Use  ? Vaping Use: Never used  ?Substance Use Topics  ? Alcohol use: Never  ? Drug use: Never  ?  ?  ?No Known Allergies ?  ?      ?Current Outpatient Medications  ?Medication Sig Dispense Refill  ? docusate sodium (COLACE) 100 MG capsule Take 1 capsule (100 mg total) by mouth daily. 30 capsule 1  ? levothyroxine (SYNTHROID) 137 MCG tablet Take 137 mcg by mouth daily before breakfast.       ?  lidocaine-prilocaine (EMLA) cream Apply to affected area once 30 g 3  ? ondansetron (ZOFRAN) 8 MG tablet Take 1 tablet (8 mg total) by mouth 2 (two) times daily as needed for refractory nausea / vomiting. Start on day 3 after chemotherapy. 30 tablet 1  ? polyethylene glycol (GOLYTELY) 236 g solution Take 4,000 mLs by mouth once for 1 dose. 4000 mL 0  ? polyethylene glycol (MIRALAX) 17 g packet Take 17 g by mouth daily as needed. 14 each 0  ? prochlorperazine (COMPAZINE) 10 MG tablet Take 1 tablet (10 mg total) by mouth every 6 (six) hours as needed (Nausea or vomiting). 30 tablet 1  ?  ?No current facility-administered medications for this  visit.  ?  ?  ?  ?Review of Systems ?Full ROS  was asked and was negative except for the information on the HPI ?  ?Physical Exam ?. ?CONSTITUTIONAL: NAD. ?EYES: Pupils are equal, round,, Sclera are non-icteric. ?EARS, NOSE, MOUTH AND THROAT: The oropharynx is clear. The oral mucosa is pink and moist. Hearing is intact to voice. ?LYMPH NODES:  Lymph nodes in the neck are normal. ?RESPIRATORY:  Lungs are clear. There is normal respiratory effort, with equal breath sounds bilaterally, and without pathologic use of accessory muscles. Port in place w/o infection ?CARDIOVASCULAR: Heart is regular without murmurs, gallops, or rubs. ?GI: The abdomen is  soft, nontender, and nondistended. There are no palpable masses. There is no hepatosplenomegaly. There are normal bowel sounds. ?Evidence of prior midline laparotomy scar with about 3 cm incisional hernia around the umbilicus.  It is reducible.   ?GU: Rectal deferred.   ?MUSCULOSKELETAL: Normal muscle strength and tone. No cyanosis or edema.   ?SKIN: Turgor is good and there are no pathologic skin lesions or ulcers. ?NEUROLOGIC: Motor and sensation is grossly normal. Cranial nerves are grossly intact. ?PSYCH:  Oriented to person, place and time. Affect is normal. ?  ?Data Reviewed ?  ?I have personally reviewed the patient's imaging, laboratory findings and medical records.   ?  ?Assessment/Plan ?  ?56 year old female with a history of significant cancer status post sigmoid colectomy 2 years ago now with recurrent disease within the anterior pelvis next to the anastomosis.  There is no evidence of any distant metastatic disease.  I do think that we need to perform a resection of the sigmoid colon and adjacent metastatic mass.  I do think that we will have to perform this open to obtain a good R0 resection as the mass seems to be just outside the anastomosis.  I will make sure that I am able to perform a good resection and be able to feel any potential metastatic  implants. ?I will be repairing the incisional hernia at this time but not placing any mesh due to significant risk factors and potential contamination ?Marland Kitchen  Procedure discussed with the patient and the family.  Risks, benefits and possible implications including but not limited to, bleeding, infection injury to adjacent structures.  I will also arrange for colonoscopy and we will place her on the schedule in the next following weeks.  She will need antibiotic preparation as well as mechanical preparation. ?This note I spent greater than 55 minutes in this encounter including personally reviewing medical records, imaging studies, placing orders, coordinating her care and performing appropriate mentation. ?A copy of this report was sent to the referring provider ?I will see her back in a couple weeks after she completes her colonoscopy perform additional counseling regarding surgical intervention ?  ?  Caroleen Hamman, MD FACS ?General Surgeon ?01/22/2022, 3:10 PM ?  ?   ?  ?  ?  ? ? ?

## 2022-01-23 ENCOUNTER — Telehealth: Payer: Self-pay

## 2022-01-23 ENCOUNTER — Telehealth: Payer: Self-pay | Admitting: Surgery

## 2022-01-23 NOTE — Telephone Encounter (Signed)
Patient called Dr. Dahlia Byes office on 01/22/2022 and said she did not understand the instructions for her colonoscopy that is schedule with Dr. Marius Ditch. Called patient back using the interpreter service the ID number was [phone] and We had to leave a message for call back

## 2022-01-23 NOTE — Telephone Encounter (Signed)
Left message for patient to call, please inform them of the following regarding scheduled surgery:   Pre-Admission date/time, COVID Testing date and Surgery date.  Surgery Date: 02/13/22 Preadmission Testing Date: 02/04/22 (phone 1p-5p) Covid Testing Date: Not needed.     Also patient will need to call at [phone], between 1-3:00pm the day before surgery, to find out what time to arrive for surgery.

## 2022-02-04 ENCOUNTER — Other Ambulatory Visit: Payer: Self-pay

## 2022-02-04 ENCOUNTER — Encounter
Admission: RE | Admit: 2022-02-04 | Discharge: 2022-02-04 | Disposition: A | Discharge status: Home / Self Care | Payer: Self-pay | Source: Ambulatory Visit | Attending: Surgery | Admitting: Surgery

## 2022-02-04 ENCOUNTER — Inpatient Hospital Stay: Admission: RE | Admit: 2022-02-04 | Payer: Self-pay | Source: Ambulatory Visit

## 2022-02-04 DIAGNOSIS — Z01812 Encounter for preprocedural laboratory examination: Secondary | ICD-10-CM

## 2022-02-04 HISTORY — DX: Personal history of urinary calculi: Z87.442

## 2022-02-04 NOTE — Patient Instructions (Addendum)
Su procedimiento est programado para: 04/13/22 - jueves Presntese en el mostrador de Press photographer [place]. Para averiguar su hora de llegada, llame al [phone] entre la 1 p. m. y las 3 p. m. el: 03/13/22 - mircoles Si su hora de llegada es a las 6:00 a. m., no llegue antes de esa hora ya que las puertas de [place] [facility] no se abren hasta las 6:00 a. m.  RECORDAR: Las instrucciones que no se siguen por Paramedic en un riesgo mdico grave, que puede incluir la [place]; o segn el criterio de su cirujano y Environmental health practitioner, es posible que sea Firefighter su Leisure centre manager.   Siga las instrucciones de Bowel Prep que le dio la oficina del Dr. Dahlia Byes.   TOME ESTOS MEDICAMENTOS LA MAANA DE LA CIRUGA CON UN SORBO DE AGUA: - tableta de levotiroxina (SYNTHROID) 137 MCG  Una semana antes de la ciruga: Deje de usar antiinflamatorios (NSAIDS) como Advil, Aleve, Ibuprofen, Motrin, Naproxen, Naprosyn y productos a base de aspirina como Excedrin, Goodys Powder, [employer].  Suspenda CUALQUIER suplemento de venta libre hasta despus de la [place].  Sin embargo, puede Clinical biochemist tomando Tylenol si es necesario para Audiological scientist de la [place].  No alcohol por 24 horas antes o despus de la [place].  No fumar, incluidos los cigarrillos electrnicos, durante las 24 horas previas a la [place]. No productos de tabaco masticables durante al menos 6 horas antes de la [place]. Sin parches de Special educational needs teacher de la [place].  No use ningn medicamento "recreativo" durante al menos una semana antes de su [place]. Tenga en cuenta que la combinacin de cocana y anestesia puede tener resultados negativos, incluso la [place]. Si la prueba de Education officer, museum positivo, se cancelar la ciruga.  En la maana de la ciruga cepllese los dientes con pasta dental y agua, [place] enjuagarse la boca con enjuague bucal si lo desea. No trague ninguna pasta de dientes o  enjuague bucal.  Use CHG Soap o toallitas como se indica en la hoja de instrucciones.  No use joyas, maquillaje, horquillas para el cabello, clips o esmalte de uas.  No use lociones, talcos ni perfumes.  No se afeite el cuerpo desde el cuello hacia abajo 48 horas antes de la ciruga en caso de que se corte, lo que podra dejar un sitio para la infeccin. Adems, la piel recin afeitada puede irritarse si se [place] el jabn CHG.  No se pueden usar lentes de contacto, audfonos ni dentaduras postizas en la ciruga.  No lleve objetos de valor al hospital. [facility] no se responsabiliza por pertenencias u objetos de valor extraviados o perdidos.  Informe a su mdico si hay algn cambio en su condicin mdica (resfriado, fiebre, infeccin).  Use ropa cmoda (especfica para su tipo de [place]) al hospital.  Despus de la ciruga, puede ayudar a prevenir complicaciones pulmonares haciendo ejercicios de respiracin. Tome respiraciones profundas y tosa cada 1-2 horas. Su mdico puede ordenar un dispositivo llamado espirmetro de incentivo para ayudarlo a respirar profundamente. Al toser o estornudar, sostenga una almohada firmemente contra la incisin con ambas manos. Esto se llama "entablillado". Hacer esto ayuda a proteger su incisin. [place] molestias abdominales.  Si lo internan en el hospital durante la noche, deje su maleta en el automvil. Despus de la [place], es posible que lo lleven a su habitacin.  Si le dan de alta el da de la [place], no se  le permitir conducir hasta su casa. Necesitar un adulto responsable (mayor de 18 aos) que lo lleve a su casa y se quede con usted esa noche.  Si viaja en transporte pblico, deber ir acompaado de un adulto responsable (mayor de 18 aos). Confirme con su mdico que es aceptable usar el transporte pblico.  Llame al Maryln Manuel de Preadmisin al [phone] si tiene alguna pregunta sobre estas  instrucciones.  Poltica de visitas a la ciruga:  Los [employer] se someten a [place] o procedimiento pueden [employer] familiares o personas de apoyo con ellos, siempre que la persona no sea COVID-19 positiva o experimente sus sntomas.  Visita de pacientes hospitalizados:  El horario de visita es de 7 a. m. a 8 p. m. Se permiten hasta cuatro visitantes a la vez en la habitacin de un paciente, incluidos los nios. Los visitantes pueden rotar con [place] representative. Ardelia Mems persona de apoyo designada (adulto) puede permanecer durante la noche.

## 2022-02-05 ENCOUNTER — Inpatient Hospital Stay: Admission: RE | Admit: 2022-02-05 | Payer: Self-pay | Source: Ambulatory Visit

## 2022-02-06 ENCOUNTER — Other Ambulatory Visit: Payer: Self-pay

## 2022-02-06 ENCOUNTER — Encounter: Payer: Self-pay | Admitting: Gastroenterology

## 2022-02-06 ENCOUNTER — Encounter: Payer: Self-pay | Admitting: Urgent Care

## 2022-02-06 ENCOUNTER — Ambulatory Visit
Admission: RE | Admit: 2022-02-06 | Discharge: 2022-02-06 | Disposition: A | Discharge status: Home / Self Care | Payer: Self-pay | Attending: Gastroenterology | Admitting: Gastroenterology

## 2022-02-06 ENCOUNTER — Encounter
Admission: RE | Disposition: A | Discharge status: Home / Self Care | Payer: Self-pay | Source: Home / Self Care | Attending: Gastroenterology

## 2022-02-06 ENCOUNTER — Ambulatory Visit: Payer: Self-pay | Admitting: Urgent Care

## 2022-02-06 DIAGNOSIS — Z980 Intestinal bypass and anastomosis status: Secondary | ICD-10-CM | POA: Insufficient documentation

## 2022-02-06 DIAGNOSIS — K644 Residual hemorrhoidal skin tags: Secondary | ICD-10-CM | POA: Insufficient documentation

## 2022-02-06 DIAGNOSIS — Z01812 Encounter for preprocedural laboratory examination: Secondary | ICD-10-CM

## 2022-02-06 DIAGNOSIS — Z85038 Personal history of other malignant neoplasm of large intestine: Secondary | ICD-10-CM | POA: Insufficient documentation

## 2022-02-06 DIAGNOSIS — E039 Hypothyroidism, unspecified: Secondary | ICD-10-CM | POA: Insufficient documentation

## 2022-02-06 DIAGNOSIS — Z9221 Personal history of antineoplastic chemotherapy: Secondary | ICD-10-CM | POA: Insufficient documentation

## 2022-02-06 DIAGNOSIS — D696 Thrombocytopenia, unspecified: Secondary | ICD-10-CM | POA: Insufficient documentation

## 2022-02-06 DIAGNOSIS — Z08 Encounter for follow-up examination after completed treatment for malignant neoplasm: Secondary | ICD-10-CM | POA: Insufficient documentation

## 2022-02-06 DIAGNOSIS — Z9049 Acquired absence of other specified parts of digestive tract: Secondary | ICD-10-CM | POA: Insufficient documentation

## 2022-02-06 HISTORY — PX: COLONOSCOPY WITH PROPOFOL: SHX5780

## 2022-02-06 LAB — CBC
HCT: 38.1 % (ref 36.0–46.0)
Hemoglobin: 12.8 g/dL (ref 12.0–15.0)
MCH: 28.8 pg (ref 26.0–34.0)
MCHC: 33.6 g/dL (ref 30.0–36.0)
MCV: 85.8 fL (ref 80.0–100.0)
Platelets: 75 10*3/uL — ABNORMAL LOW (ref 150–400)
RBC: 4.44 MIL/uL (ref 3.87–5.11)
RDW: 13.5 % (ref 11.5–15.5)
WBC: 3.6 10*3/uL — ABNORMAL LOW (ref 4.0–10.5)
nRBC: 0 % (ref 0.0–0.2)

## 2022-02-06 LAB — TYPE AND SCREEN
ABO/RH(D): O POS
Antibody Screen: NEGATIVE

## 2022-02-06 SURGERY — COLONOSCOPY WITH PROPOFOL
Anesthesia: General

## 2022-02-06 MED ORDER — LIDOCAINE HCL (CARDIAC) PF 100 MG/5ML IV SOSY
PREFILLED_SYRINGE | INTRAVENOUS | Status: DC | PRN
  Administered 2022-02-06: 50 mg via INTRAVENOUS

## 2022-02-06 MED ORDER — PHENYLEPHRINE HCL (PRESSORS) 10 MG/ML IV SOLN
INTRAVENOUS | Status: DC | PRN
  Administered 2022-02-06: 160 ug via INTRAVENOUS

## 2022-02-06 MED ORDER — PROPOFOL 500 MG/50ML IV EMUL
INTRAVENOUS | Status: DC | PRN
  Administered 2022-02-06: 150 ug/kg/min via INTRAVENOUS

## 2022-02-06 MED ORDER — PROPOFOL 10 MG/ML IV BOLUS
INTRAVENOUS | Status: DC | PRN
  Administered 2022-02-06: 80 mg via INTRAVENOUS

## 2022-02-06 MED ORDER — SODIUM CHLORIDE 0.9 % IV SOLN: INTRAVENOUS | Status: DC

## 2022-02-06 NOTE — Anesthesia Postprocedure Evaluation (Signed)
Anesthesia Post Note  Patient: Debra [name]  Procedure(s) Performed: COLONOSCOPY WITH PROPOFOL  Patient location during evaluation: Endoscopy Anesthesia Type: General Level of consciousness: awake and alert Pain management: pain level controlled Vital Signs Assessment: post-procedure vital signs reviewed and stable Respiratory status: spontaneous breathing, nonlabored ventilation, respiratory function stable and patient connected to nasal cannula oxygen Cardiovascular status: blood pressure returned to baseline and stable Postop Assessment: no apparent nausea or vomiting Anesthetic complications: no   No notable events documented.   Last Vitals:  Vitals:   02/06/22 1122 02/06/22 1140  BP: (!) 89/68   Pulse: (!) 57   Resp: 18   Temp:    SpO2: 98% 99%    Last Pain:  Vitals:   02/06/22 1150  TempSrc:   PainSc: 0-No pain                 Precious Haws Felicity Penix

## 2022-02-06 NOTE — Op Note (Signed)
[facility] Gastroenterology Patient Name: Debra [name] Procedure Date: 02/06/2022 10:41 AM MRN: [id number] Account #: [id number] Date of Birth: 01-13-1966 Admit Type: Outpatient Age: 56 Room: [facility] ENDO ROOM 2 Gender: Female Note Status: Finalized Instrument Name: Park Meo [id number] Procedure:             Colonoscopy Indications:           High risk colon cancer surveillance: Personal history                         of colon cancer, , concern for recurrence at the                         anastomosis Providers:             Lin Landsman MD, MD Medicines:             General Anesthesia Complications:         No immediate complications. Estimated blood loss: None. Procedure:             Pre-Anesthesia Assessment:                        - Prior to the procedure, a History and Physical was                         performed, and patient medications and allergies were                         reviewed. The patient is competent. The risks and                         benefits of the procedure and the sedation options and                         risks were discussed with the patient. All questions                         were answered and informed consent was obtained.                         Patient identification and proposed procedure were                         verified by the physician, the nurse, the                         anesthesiologist, the anesthetist and the technician                         in the pre-procedure area in the procedure room in the                         endoscopy suite. Mental Status Examination: alert and                         oriented. Airway Examination: normal oropharyngeal                         airway and  neck mobility. Respiratory Examination:                         clear to auscultation. CV Examination: normal.                         Prophylactic Antibiotics: The patient does not require                          prophylactic antibiotics. Prior Anticoagulants: The                         patient has taken no previous anticoagulant or                         antiplatelet agents. ASA Grade Assessment: III - A                         patient with severe systemic disease. After reviewing                         the risks and benefits, the patient was deemed in                         satisfactory condition to undergo the procedure. The                         anesthesia plan was to use general anesthesia.                         Immediately prior to administration of medications,                         the patient was re-assessed for adequacy to receive                         sedatives. The heart rate, respiratory rate, oxygen                         saturations, blood pressure, adequacy of pulmonary                         ventilation, and response to care were monitored                         throughout the procedure. The physical status of the                         patient was re-assessed after the procedure.                        After obtaining informed consent, the colonoscope was                         passed under direct vision. Throughout the procedure,                         the patient's blood pressure, pulse, and oxygen  saturations were monitored continuously. The                         Colonoscope was introduced through the anus and                         advanced to the the cecum, identified by appendiceal                         orifice and ileocecal valve. The colonoscopy was                         performed without difficulty. The patient tolerated                         the procedure well. The quality of the bowel                         preparation was good. Findings:      The perianal and digital rectal examinations were normal. Pertinent       negatives include normal sphincter tone and no palpable rectal lesions.      There was evidence of a  prior functional end-to-end colo-colonic       anastomosis in the sigmoid colon at 30cm. This was patent and was       characterized by healthy appearing mucosa. The anastomosis was traversed.      Non-bleeding external hemorrhoids were found during retroflexion. The       hemorrhoids were medium-sized.      The terminal ileum appeared normal. Impression:            - Patent functional end-to-end colo-colonic                         anastomosis, characterized by healthy appearing mucosa.                        - Non-bleeding external hemorrhoids.                        - No specimens collected. Recommendation:        - Discharge patient to home (with escort).                        - Resume previous diet today.                        - Continue present medications.                        - Repeat colonoscopy in 5 years for surveillance.                        - Return to referring physician as previously                         scheduled. Procedure Code(s):     --- Professional ---                        H4765, Colorectal cancer screening; colonoscopy on  individual at high risk Diagnosis Code(s):     --- Professional ---                        [phone], Personal history of other malignant neoplasm                         of large intestine                        Z98.0, Intestinal bypass and anastomosis status                        K64.4, Residual hemorrhoidal skin tags CPT copyright 2019 American Medical Association. All rights reserved. The codes documented in this report are preliminary and upon coder review may  be revised to meet current compliance requirements. Dr. Ulyess Mort Lin Landsman MD, MD 02/06/2022 11:19:12 AM This report has been signed electronically. Number of Addenda: 0 Note Initiated On: 02/06/2022 10:41 AM Scope Withdrawal Time: 0 hours 8 minutes 49 seconds  Total Procedure Duration: 0 hours 10 minutes 0 seconds  Estimated Blood Loss:   Estimated blood loss: none. Estimated blood loss: none.      [facility]

## 2022-02-06 NOTE — Anesthesia Preprocedure Evaluation (Addendum)
Anesthesia Evaluation  Patient identified by MRN, date of birth, ID band Patient awake    Reviewed: Allergy & Precautions, NPO status , Patient's Chart, lab work & pertinent test results  History of Anesthesia Complications Negative for: history of anesthetic complications  Airway Mallampati: III  TM Distance: <3 FB Neck ROM: full    Dental  (+) Chipped, Poor Dentition, Partial Upper, Missing   Pulmonary neg pulmonary ROS, neg shortness of breath,    Pulmonary exam normal        Cardiovascular Exercise Tolerance: Good (-) angina(-) Past MI and (-) DOE negative cardio ROS Normal cardiovascular exam     Neuro/Psych negative neurological ROS  negative psych ROS   GI/Hepatic negative GI ROS, Neg liver ROS, neg GERD  ,  Endo/Other  Hypothyroidism   Renal/GU negative Renal ROS  negative genitourinary   Musculoskeletal   Abdominal   Peds  Hematology negative hematology ROS (+)   Anesthesia Other Findings Past Medical History: No date: Anemia 04/2020: Colon cancer ([place]) No date: History of kidney stones No date: Hypothyroidism No date: Thyroid disease  Past Surgical History: 11/21/2019: COLONOSCOPY WITH PROPOFOL; N/A     Comment:  Procedure: COLONOSCOPY WITH PROPOFOL;  Surgeon: Lin Landsman, MD;  Location: [facility] ENDOSCOPY;  Service:               Gastroenterology;  Laterality: N/A; 07/10/2021: COLONOSCOPY WITH PROPOFOL; N/A     Comment:  Procedure: COLONOSCOPY WITH PROPOFOL;  Surgeon: Lin Landsman, MD;  Location: [facility] ENDOSCOPY;  Service:               Gastroenterology;  Laterality: N/A; 01/30/2020: CYSTOSCOPY WITH STENT PLACEMENT; Left     Comment:  Procedure: CYSTOSCOPY WITH STENT PLACEMENT;  Surgeon:               Hollice Espy, MD;  Location: [facility] ORS;  Service:               Urology;  Laterality: Left; 11/21/2019: ESOPHAGOGASTRODUODENOSCOPY (EGD) WITH PROPOFOL; N/A      Comment:  Procedure: ESOPHAGOGASTRODUODENOSCOPY (EGD) WITH               PROPOFOL;  Surgeon: Lin Landsman, MD;  Location:               [facility] ENDOSCOPY;  Service: Gastroenterology;  Laterality:               N/A; 07/03/2020: IR RADIOLOGIST EVAL & MGMT 07/12/2020: IR RADIOLOGIST EVAL & MGMT 01/30/2020: PARTIAL COLECTOMY; N/A     Comment:  Procedure: PARTIAL COLECTOMY Sigmoid;  Surgeon: [name], MD;  Location: [facility] ORS;  Service: General;                Laterality: N/A; 03/28/2020: PORTACATH PLACEMENT; Left     Comment:  Procedure: INSERTION PORT-A-CATH;  Surgeon: [name], MD;  Location: [facility] ORS;  Service: General;                Laterality: Left; 08/29/2020: PORTACATH PLACEMENT; N/A     Comment:  Procedure: INSERTION PORT-A-CATH, chemotherapy;  Surgeon: [name], MD;  Location: [facility] ORS;                Service: General;  Laterality: N/A;     Reproductive/Obstetrics negative OB ROS                             Anesthesia Physical Anesthesia Plan  ASA: 3  Anesthesia Plan: General   Post-op Pain Management:    Induction: Intravenous  PONV Risk Score and Plan: Propofol infusion and TIVA  Airway Management Planned: Natural Airway and Nasal Cannula  Additional Equipment:   Intra-op Plan:   Post-operative Plan:   Informed Consent: I have reviewed the patients History and Physical, chart, labs and discussed the procedure including the risks, benefits and alternatives for the proposed anesthesia with the patient or authorized representative who has indicated his/her understanding and acceptance.     Dental Advisory Given and Interpreter used for interveiw  Plan Discussed with: Anesthesiologist, CRNA and Surgeon  Anesthesia Plan Comments: (Patient consented for risks of anesthesia including but not limited to:  - adverse reactions to medications - risk of airway placement if  required - damage to eyes, teeth, lips or other oral mucosa - nerve damage due to positioning  - sore throat or hoarseness - Damage to heart, brain, nerves, lungs, other parts of body or loss of life  Patient voiced understanding.)       Anesthesia Quick Evaluation

## 2022-02-06 NOTE — Anesthesia Procedure Notes (Signed)
Date/Time: 02/06/2022 11:00 AM Performed by: Johnna Acosta, CRNA Pre-anesthesia Checklist: Patient identified, Emergency Drugs available, Suction available, Patient being monitored and Timeout performed Patient Re-evaluated:Patient Re-evaluated prior to induction Oxygen Delivery Method: Nasal cannula Preoxygenation: Pre-oxygenation with 100% oxygen Induction Type: IV induction

## 2022-02-06 NOTE — H&P (Signed)
Cephas Darby, MD [address]  [place]  [place], [place] [address]  Main: [phone]  Fax: [phone] Pager: [phone]  Primary Care Physician:  [name], MD Primary Gastroenterologist:  Dr. Cephas Darby  Pre-Procedure History & Physical: HPI:  Debra [name] is a 56 y.o. female is here for an colonoscopy.   Past Medical History:  Diagnosis Date   Anemia    Colon cancer ([place]) 04/2020   History of kidney stones    Hypothyroidism    Thyroid disease     Past Surgical History:  Procedure Laterality Date   COLONOSCOPY WITH PROPOFOL N/A 11/21/2019   Procedure: COLONOSCOPY WITH PROPOFOL;  Surgeon: Lin Landsman, MD;  Location[facility] END[facility]COPY;  Service: Gastroenterology;  Laterality: N/A;   COLONOSCOPY WITH PROPOFOL N/A 07/10/2021   Procedure: COLONOSCOPY WITH PROPOFOL;  Surgeon: Lin Landsman, MD;  Location: [facility] ENDOSCOPY;  Service: Gastroenterology;  Laterality: N/A;   CYSTOSCOPY WITH STENT PLACEMENT Left 01/30/2020   Procedure: CYSTOSCOPY WITH STENT PLACEMENT;  Surgeon: Hollice Espy, MD;  Location: [facility] ORS;  Service: Urology;  Laterality: Left;   ESOPHAGOGASTRODUODENOSCOPY (EGD) WITH PROPOFOL N/A 11/21/2019   Procedure: ESOPHAGOGASTRODUODENOSCOPY (EGD) WITH PROPOFOL;  Surgeon: Lin Landsman, MD;  Location: [facility] ENDOSCOPY;  Service: Gastroenterology;  Laterality: N/A;   IR RADIOLOGIST EVAL & MGMT  07/03/2020   IR RADIOLOGIST EVAL & MGMT  07/12/2020   PARTIAL COLECTOMY N/A 01/30/2020   Procedure: PARTIAL COLECTOMY Sigmoid;  Surgeon: [name], MD;  Location: [place] ORS;  Service: General;  Laterality: N/A;   PORTACATH PLACEMENT Left 03/28/2020   Procedure: INSERTION PORT-A-CATH;  Surgeon: [name], MD;  Location: [facility] ORS;  Service: General;  Laterality: Left;   PORTACATH PLACEMENT N/A 08/29/2020   Procedure: INSERTION PORT-A-CATH, chemotherapy;  Surgeon: [name], MD;  Location: [facility] ORS;  Service: General;   Laterality: N/A;    Prior to Admission medications   Medication Sig Start Date End Date Taking? Authorizing Provider  docusate sodium (COLACE) 100 MG capsule Take 1 capsule (100 mg total) by mouth daily. 05/18/20   Earlie Server, MD  levothyroxine (SYNTHROID) 137 MCG tablet Take 137 mcg by mouth daily before breakfast.     [provider]  lidocaine-prilocaine (EMLA) cream Apply to affected area once 09/17/20   Earlie Server, MD  ondansetron (ZOFRAN) 8 MG tablet Take 1 tablet (8 mg total) by mouth 2 (two) times daily as needed for refractory nausea / vomiting. Start on day 3 after chemotherapy. Patient not taking: Reported on 02/04/2022 03/08/20   Earlie Server, MD  polyethylene glycol (MIRALAX) 17 g packet Take 17 g by mouth daily as needed. 10/29/20   Earlie Server, MD  prochlorperazine (COMPAZINE) 10 MG tablet Take 1 tablet (10 mg total) by mouth every 6 (six) hours as needed (Nausea or vomiting). 03/08/20   Earlie Server, MD    Allergies as of 01/22/2022   (No Known Allergies)    Family History  Problem Relation Age of Onset   Diabetes Sister     Social History   Socioeconomic History   Marital status: Significant Other    Spouse name: Not on file   Number of children: Not on file   Years of education: Not on file   Highest education level: Not on file  Occupational History   Occupation: Factory  Tobacco Use   Smoking status: Never   Smokeless tobacco: Never  Vaping Use   Vaping Use: Never used  Substance and Sexual Activity  Alcohol use: Never   Drug use: Never   Sexual activity: Not on file  Other Topics Concern   Not on file  Social History Narrative   Lives at home with significant other   Social Determinants of Health   Financial Resource Strain: Not on file  Food Insecurity: Not on file  Transportation Needs: Not on file  Physical Activity: Not on file  Stress: Not on file  Social Connections: Not on file  Intimate Partner Violence: Not on file    Review of Systems: See  HPI, otherwise negative ROS  Physical Exam: BP 128/68   Pulse (!) 58   Temp (!) 97.4 F (36.3 C) (Temporal)   Resp 16   Ht '4\' 11"'$  (1.499 m)   Wt 89.4 kg   BMI 39.79 kg/m  General:   Alert,  pleasant and cooperative in NAD Head:  Normocephalic and atraumatic. Neck:  Supple; no masses or thyromegaly. Lungs:  Clear throughout to auscultation.    Heart:  Regular rate and rhythm. Abdomen:  Soft, nontender and nondistended. Normal bowel sounds, without guarding, and without rebound.   Neurologic:  Alert and  oriented x4;  grossly normal neurologically.  Impression/Plan: Debra [name] is here for an colonoscopy to be performed for h/o colon cancer, now with recurrence  Risks, benefits, limitations, and alternatives regarding  colonoscopy have been reviewed with the patient.  Questions have been answered.  All parties agreeable.   Sherri Sear, MD  02/06/2022, 10:59 AM

## 2022-02-06 NOTE — Progress Notes (Signed)
  [facility] Perioperative Services: Pre-Admission/Anesthesia Testing  Abnormal Lab Notification   Date: 02/06/22  Name: Suanne Minahan MRN:   [id number]  Re: Abnormal labs noted during PAT appointment   Notified:  Provider Name Provider Role Notification Mode  Nicholson, Bea Graff, MD  General Surgery Routed and/or faxed via [place] and Notes:  ABNORMAL LAB VALUE(S): Lab Results  Component Value Date   PLT 75 (L) 02/06/2022   Cambri Ruberta Holck is scheduled for a SIGMOID COLON RESECTION; [place] on 02/13/2022.  In review of her preoperative lab work, patient noted to be thrombocytopenic.  Further review of patient's medical record revealed that she was treated with adjuvant FOLFOX chemotherapy x 12 cycles for stage IIIb colon cancer between 09/03/2020 and 01/14/2021.  Platelet counts between January 2022 and the present trended and noted to range from 68,000 to 143,000.    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 Today's CBC reveals a normal Hgb and Hct. Platelet count 75,000. Active type and screen also obtained today.   As a proactive approach to this potential patient need, I have spoken with the blood bank. We do not always have platelets on hand here at [facility]. If needed, we can obtain from our lab in [place], but this could take as much as 1-2 hours to obtain. Per blood bank, it would be ideal to order unit prior to the day of surgery. Case has been discussed with anesthesia (Piscitello, MD) who is recommending that we at least have access to platelets if needed.   Given the complexity and risk of perioperative bleeding associated with patient's upcoming surgical course, note being forwarded to primary attending surgeon for review and consideration of potential need for perioperative platelet  transfusion.   Honor Loh, MSN, APRN, FNP-C, CEN [facility]  Peri-operative Services Nurse Practitioner Phone: [phone] Fax: [phone] 02/06/22 11:15 AM

## 2022-02-06 NOTE — Transfer of Care (Signed)
Immediate Anesthesia Transfer of Care Note  Patient: Debra [name]  Procedure(s) Performed: COLONOSCOPY WITH PROPOFOL  Patient Location: PACU  Anesthesia Type:General  Level of Consciousness: sedated  Airway & Oxygen Therapy: Patient Spontanous Breathing  Post-op Assessment: Report given to RN and Post -op Vital signs reviewed and stable  Post vital signs: Reviewed and stable  Last Vitals:  Vitals Value Taken Time  BP 89/68 02/06/22 1122  Temp    Pulse 57 02/06/22 1122  Resp 18 02/06/22 1122  SpO2 98 % 02/06/22 1122  Vitals shown include unvalidated device data.  Last Pain:  Vitals:   02/06/22 1052  TempSrc: Temporal  PainSc: 0-No pain         Complications: No notable events documented.

## 2022-02-07 ENCOUNTER — Encounter: Payer: Self-pay | Admitting: Gastroenterology

## 2022-02-10 ENCOUNTER — Encounter: Payer: Self-pay | Admitting: Surgery

## 2022-02-10 ENCOUNTER — Ambulatory Visit (INDEPENDENT_AMBULATORY_CARE_PROVIDER_SITE_OTHER): Payer: Self-pay | Admitting: Surgery

## 2022-02-10 VITALS — BP 121/67 | HR 58 | Temp 98.2°F | Wt 197.0 lb

## 2022-02-10 DIAGNOSIS — C187 Malignant neoplasm of sigmoid colon: Secondary | ICD-10-CM

## 2022-02-10 MED ORDER — POLYETHYLENE GLYCOL 3350 17 GM/SCOOP PO POWD: ORAL | 0 refills | Status: DC

## 2022-02-10 MED ORDER — SODIUM CHLORIDE 0.9% IV SOLUTION: Freq: Once | INTRAVENOUS | Status: DC

## 2022-02-10 MED ORDER — NEOMYCIN SULFATE 500 MG PO TABS: ORAL_TABLET | ORAL | 0 refills | Status: DC

## 2022-02-10 MED ORDER — METRONIDAZOLE 500 MG PO TABS: ORAL_TABLET | ORAL | 0 refills | Status: DC

## 2022-02-10 NOTE — Addendum Note (Signed)
Addended by: Caroleen Hamman F on: 02/10/2022 03:15 PM   Modules accepted: Orders

## 2022-02-10 NOTE — Patient Instructions (Signed)
If you have any concerns or questions, please feel free to call our office.  Please pick up your prescriptions at you pharmacy.  Follow Bowel Prep sheet given.  Masa colnica, en adultos Colon Mass, Adult  Una masa colnica es un crecimiento anormal en el colon, que es una parte del intestino grueso. Una masa de tejido puede causar un bloqueo (obstruccin) o una hemorragia en el colon. Cules son las causas? Esta afeccin puede ser causada por lo siguiente: Cncer. Problemas de los vasos sanguneos. Infeccin. Ciertas enfermedades del colon o los intestinos. Esto incluye lo siguiente: Una enfermedad inflamatoria del intestino, como colitis ulcerosa o enfermedad de Crohn. Diverticulitis. Es la inflamacin o la infeccin de pequeas bolsas que se forman en el intestino. Endometriosis. Es una afeccin por la cual el tejido que reviste internamente el tero se desarrolla fuera del tero. Tejido cicatricial (adherencias) de una ciruga anterior en el abdomen o de tumores benignos como un lipoma, teratoma o hemangioma. Vlvulo. Esto es Ardelia Mems torsin del intestino. Cules son los signos o sntomas? Los sntomas de esta afeccin incluyen: Clicos, nuseas, diarrea o vmitos. Cristy Hilts o sensacin de debilidad. Dolor en el abdomen, en un costado o en la espalda. Prdida de peso o prdida del apetito. Estreimiento o [employer] hbitos intestinales. Sangrado que proviene del recto. Sentir necesidad de defecar despus de haberlo hecho recientemente. En algunos casos, no hay sntomas de esta afeccin. Cmo se diagnostica? Esta afeccin se puede diagnosticar en funcin de pruebas y procedimientos que se realizan para obtener ms informacin sobre la masa de tejido. Estos pueden incluir lo siguiente: Anlisis de sangre. Radiografas, ecografa, exploracin por tomografa computarizada (TC) o resonancia magntica (RM). Colonoscopa. En este procedimiento, se inserta un tubo flexible recubierto  de aceite o gel (lubricado) en la abertura que est [employer] nalgas (ano) y luego se lo hace avanzar hacia el recto y el colon para examinar las zonas. Biopsia. Se trata de la extraccin de Truddie Coco de tejido de la masa para examinarla con un microscopio. Se puede tomar una biopsia durante una colonoscopa. En algunos casos, lo que parece ser [employer] colnica puede ser, en realidad, otra cosa, por ejemplo, una cicatriz que se form despus de una ciruga o una lcera. Cmo se trata? El tratamiento de esta afeccin depende de la causa de la masa. Usted y Nature conservation officer el significado de los resultados de las pruebas y los pasos que se recomiendan para Biochemist, clinical. Si hay problemas graves, puede ser necesario realizar [place]. Siga estas instrucciones en su casa: Lo que deba hacer en su casa depender de la causa de la masa de tejido. Siga las instrucciones del mdico. En general: Use los medicamentos de venta libre y los recetados solamente como se lo haya indicado el mdico. Cumpla con todas las visitas de seguimiento. Esto es importante. Comunquese con un mdico si: Sus sntomas empeoran. Aparecen nuevos sntomas. Tiene fiebre. Los medicamentos no Forensic psychologist. Se siente ms dbil. Presenta moretones o sangra fcilmente. Solicite ayuda de inmediato si: Vomita sangre de color rojo brillante o una sustancia parecida a los granos de caf. Lollie Marrow en las heces (materia fecal) o las heces se tornan negras y de aspecto alquitranado. Se desmaya. Siente que la masa se agrand repentinamente. Experimenta hinchazn o presin grave en el abdomen (meteorismo). Resumen Una masa colnica es un crecimiento anormal en el colon, que es una parte del intestino grueso. La masa colnica tiene muchas  causas posibles. El tratamiento depende de la causa de la masa. Si hay problemas graves, puede ser necesario realizar [place]. Use los  medicamentos de venta libre y los recetados solamente como se lo haya indicado el mdico. Esta informacin no tiene Marine scientist el consejo del mdico. Asegrese de hacerle al mdico cualquier pregunta que tenga. Document Revised: 02/13/2020 Document Reviewed: 02/13/2020 Elsevier Patient Education  [place].

## 2022-02-10 NOTE — Progress Notes (Signed)
Outpatient Surgical Follow Up  02/10/2022  Debra [name] is an 56 y.o. female.   Chief Complaint  Patient presents with   Follow-up    Colon mass    HPI: Debra [name] is a 56 y.o. female seen in consultation at the request of Dr. 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 Celine Ahr 2 years ago.  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 She has been seen by Dr. Tasia Catchings and she did not require adjuvant chemotherapy.  Her CEA level started to increase and the last one was 7.0.  Underwent further PET/CT that I have personally reviewed showing evidence of hyper active mass on the anterior pelvis next to the anastomosis.  There is no evidence of anastomotic leak. He is currently able to perform more than 4 METS of activity without any shortness of breath or chest pain.  She is eating regular diet and having bowel movements.  Recent colonoscopy reviewed, case d/w Dr. Marius Ditch and Dr. Tasia Catchings. IR unable to biopsy mass,. We will do resection and may need to do colon resection in order to do R0 resection  She is here with her daughter She does have thrombocytopenia and platelets are 82,000.,  Hemoglobin is 12.4.  CMP is completely normal HPI    Past Medical History:  Diagnosis Date   Anemia    Colon cancer ([place]) 04/2020   History of kidney stones    Hypothyroidism    Thyroid disease     Past Surgical History:  Procedure Laterality Date   COLONOSCOPY WITH PROPOFOL N/A 11/21/2019   Procedure: COLONOSCOPY WITH PROPOFOL;  Surgeon: Lin Landsman, MD;  Location: [facility] ENDOSCOPY;  Service: Gastroenterology;  Laterality: N/A;   COLONOSCOPY WITH PROPOFOL N/A 07/10/2021   Procedure: COLONOSCOPY WITH PROPOFOL;  Surgeon: Lin Landsman, MD;  Location: [facility] ENDOSCOPY;  Service: Gastroenterology;  Laterality: N/A;   COLONOSCOPY WITH PROPOFOL N/A 02/06/2022   Procedure:  COLONOSCOPY WITH PROPOFOL;  Surgeon: Lin Landsman, MD;  Location: [facility] ENDOSCOPY;  Service: Gastroenterology;  Laterality: N/A;  SPANISH   CYSTOSCOPY WITH STENT PLACEMENT Left 01/30/2020   Procedure: CYSTOSCOPY WITH STENT PLACEMENT;  Surgeon: Hollice Espy, MD;  Location: [facility] ORS;  Service: Urology;  Laterality: Left;   ESOPHAGOGASTRODUODENOSCOPY (EGD) WITH PROPOFOL N/A 11/21/2019   Procedure: ESOPHAGOGASTRODUODENOSCOPY (EGD) WITH PROPOFOL;  Surgeon: Lin Landsman, MD;  Location: [facility] ENDOSCOPY;  Service: Gastroenterology;  Laterality: N/A;   IR RADIOLOGIST EVAL & MGMT  07/03/2020   IR RADIOLOGIST EVAL & MGMT  07/12/2020   PARTIAL COLECTOMY N/A 01/30/2020   Procedure: PARTIAL COLECTOMY Sigmoid;  Surgeon: [name], MD;  Location: [place] ORS;  Service: General;  Laterality: N/A;   PORTACATH PLACEMENT Left 03/28/2020   Procedure: INSERTION PORT-A-CATH;  Surgeon: [name], MD;  Location: [facility] ORS;  Service: General;  Laterality: Left;   PORTACATH PLACEMENT N/A 08/29/2020   Procedure: INSERTION PORT-A-CATH, chemotherapy;  Surgeon: [name], MD;  Location: [facility] ORS;  Service: General;  Laterality: N/A;    Family History  Problem Relation Age of Onset   Diabetes Sister     Social History:  reports that she has never smoked. She has never used smokeless tobacco. She reports that she does not drink alcohol and does not use drugs.  Allergies: No Known Allergies  Medications reviewed.    ROS Full ROS performed and is otherwise negative other than what is stated in HPI  BP 121/67   Pulse (!) 58   Temp 98.2 F (36.8 C) (Oral)   Wt 197 lb (89.4 kg)   SpO2 98%   BMI 39.79 kg/m   Physical Exam  CONSTITUTIONAL: NAD. EYES: Pupils are equal, round, and reactive to light, Sclera are non-icteric. EARS, NOSE, MOUTH AND THROAT: The oropharynx is clear. The oral mucosa is pink and moist. Hearing is intact to voice. LYMPH NODES:  Lymph nodes in the neck are  normal. RESPIRATORY:  Lungs are clear. There is normal respiratory effort, with equal breath sounds bilaterally, and without pathologic use of accessory muscles. Port in place w/o infection CARDIOVASCULAR: Heart is regular without murmurs, gallops, or rubs. GI: The abdomen is  soft, nontender, and nondistended. There are no palpable masses. There is no hepatosplenomegaly. There are normal bowel sounds. Evidence of prior midline laparotomy scar with about 3 cm incisional hernia around the umbilicus.  It is reducible.   GU: Rectal deferred.   MUSCULOSKELETAL: Normal muscle strength and tone. No cyanosis or edema.   SKIN: Turgor is good and there are no pathologic skin lesions or ulcers. NEUROLOGIC: Motor and sensation is grossly normal. Cranial nerves are grossly intact. PSYCH:  Oriented to person, place and time. Affect is normal.    Assessment/Plan: 56 year old female with a history of significant cancer status post sigmoid colectomy 2 years ago now with recurrent disease within the anterior pelvis next to the anastomosis.  There is no evidence of any distant metastatic disease.  I do think that we need to perform a resection of adjacent metastatic mass + or - colon resection.  I do think that we will have to perform this open to obtain a good R0 resection as the mass seems to be just outside the anastomosis.  I will make sure that I am able to perform a good resection and be able to feel any potential metastatic implants. I will be repairing the incisional hernia at this time but not placing any mesh due to significant risk factors and potential contamination .  Procedure discussed with the patient and the family.  Risks, benefits and possible implications including but not limited to, bleeding, infection injury to adjacent structures.  I will also arrange for colonoscopy and we will place her on the schedule in the next following weeks.  She will need antibiotic preparation as well as mechanical  preparation. We will also transfuse 1 unit PL as she is thrombocytopenic and this is a major abdominal operation This note I spent greater than 40 minutes in this encounter including personally reviewing medical records, imaging studies, placing orders, coordinating her care and performing appropriate mentation.    Caroleen Hamman, MD [facility] General Surgeon

## 2022-02-10 NOTE — H&P (View-Only) (Signed)
Outpatient Surgical Follow Up  02/10/2022  Royalti Brady Plant is an 56 y.o. female.   Chief Complaint  Patient presents with   Follow-up    Colon mass    HPI: Clarissa Celsey Asselin is a 56 y.o. female seen in consultation at the request of Dr. 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 Celine Ahr 2 years ago.  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 She has been seen by Dr. Tasia Catchings and she did not require adjuvant chemotherapy.  Her CEA level started to increase and the last one was 7.0.  Underwent further PET/CT that I have personally reviewed showing evidence of hyper active mass on the anterior pelvis next to the anastomosis.  There is no evidence of anastomotic leak. He is currently able to perform more than 4 METS of activity without any shortness of breath or chest pain.  She is eating regular diet and having bowel movements.  Recent colonoscopy reviewed, case d/w Dr. Marius Ditch and Dr. Tasia Catchings. IR unable to biopsy mass,. We will do resection and may need to do colon resection in order to do R0 resection  She is here with her daughter She does have thrombocytopenia and platelets are 82,000.,  Hemoglobin is 12.4.  CMP is completely normal HPI    Past Medical History:  Diagnosis Date   Anemia    Colon cancer ([place]) 04/2020   History of kidney stones    Hypothyroidism    Thyroid disease     Past Surgical History:  Procedure Laterality Date   COLONOSCOPY WITH PROPOFOL N/A 11/21/2019   Procedure: COLONOSCOPY WITH PROPOFOL;  Surgeon: Lin Landsman, MD;  Location: [facility] ENDOSCOPY;  Service: Gastroenterology;  Laterality: N/A;   COLONOSCOPY WITH PROPOFOL N/A 07/10/2021   Procedure: COLONOSCOPY WITH PROPOFOL;  Surgeon: Lin Landsman, MD;  Location: [facility] ENDOSCOPY;  Service: Gastroenterology;  Laterality: N/A;   COLONOSCOPY WITH PROPOFOL N/A 02/06/2022   Procedure:  COLONOSCOPY WITH PROPOFOL;  Surgeon: Lin Landsman, MD;  Location: [facility] ENDOSCOPY;  Service: Gastroenterology;  Laterality: N/A;  SPANISH   CYSTOSCOPY WITH STENT PLACEMENT Left 01/30/2020   Procedure: CYSTOSCOPY WITH STENT PLACEMENT;  Surgeon: Hollice Espy, MD;  Location: [facility] ORS;  Service: Urology;  Laterality: Left;   ESOPHAGOGASTRODUODENOSCOPY (EGD) WITH PROPOFOL N/A 11/21/2019   Procedure: ESOPHAGOGASTRODUODENOSCOPY (EGD) WITH PROPOFOL;  Surgeon: Lin Landsman, MD;  Location: [facility] ENDOSCOPY;  Service: Gastroenterology;  Laterality: N/A;   IR RADIOLOGIST EVAL & MGMT  07/03/2020   IR RADIOLOGIST EVAL & MGMT  07/12/2020   PARTIAL COLECTOMY N/A 01/30/2020   Procedure: PARTIAL COLECTOMY Sigmoid;  Surgeon: [name], MD;  Location: [place] [facility]S;  Service: General;  Laterality: N/A;   PORTACATH PLACEMENT Left 03/28/2020   Procedure: INSERTION PORT-A-CATH;  Surgeon: [name], MD;  Location: [facility] ORS;  Service: General;  Laterality: Left;   PORTACATH PLACEMENT N/A 08/29/2020   Procedure: INSERTION PORT-A-CATH, chemotherapy;  Surgeon: [name], MD;  Location: [facility] ORS;  Service: General;  Laterality: N/A;    Family History  Problem Relation Age of Onset   Diabetes Sister     Social History:  reports that she has never smoked. She has never used smokeless tobacco. She reports that she does not drink alcohol and does not use drugs.  Allergies: No Known Allergies  Medications reviewed.    ROS Full ROS performed and is otherwise negative other than what is stated in HPI  BP 121/67   Pulse (!) 58   Temp 98.2 F (36.8 C) (Oral)   Wt 197 lb (89.4 kg)   SpO2 98%   BMI 39.79 kg/m   Physical Exam  CONSTITUTIONAL: NAD. EYES: Pupils are equal, round, and reactive to light, Sclera are non-icteric. EARS, NOSE, MOUTH AND THROAT: The oropharynx is clear. The oral mucosa is pink and moist. Hearing is intact to voice. LYMPH NODES:  Lymph nodes in the neck are  normal. RESPIRATORY:  Lungs are clear. There is normal respiratory effort, with equal breath sounds bilaterally, and without pathologic use of accessory muscles. Port in place w/o infection CARDIOVASCULAR: Heart is regular without murmurs, gallops, or rubs. GI: The abdomen is  soft, nontender, and nondistended. There are no palpable masses. There is no hepatosplenomegaly. There are normal bowel sounds. Evidence of prior midline laparotomy scar with about 3 cm incisional hernia around the umbilicus.  It is reducible.   GU: Rectal deferred.   MUSCULOSKELETAL: Normal muscle strength and tone. No cyanosis or edema.   SKIN: Turgor is good and there are no pathologic skin lesions or ulcers. NEUROLOGIC: Motor and sensation is grossly normal. Cranial nerves are grossly intact. PSYCH:  Oriented to person, place and time. Affect is normal.    Assessment/Plan: 56 year old female with a history of significant cancer status post sigmoid colectomy 2 years ago now with recurrent disease within the anterior pelvis next to the anastomosis.  There is no evidence of any distant metastatic disease.  I do think that we need to perform a resection of adjacent metastatic mass + or - colon resection.  I do think that we will have to perform this open to obtain a good R0 resection as the mass seems to be just outside the anastomosis.  I will make sure that I am able to perform a good resection and be able to feel any potential metastatic implants. I will be repairing the incisional hernia at this time but not placing any mesh due to significant risk factors and potential contamination .  Procedure discussed with the patient and the family.  Risks, benefits and possible implications including but not limited to, bleeding, infection injury to adjacent structures.  I will also arrange for colonoscopy and we will place her on the schedule in the next following weeks.  She will need antibiotic preparation as well as mechanical  preparation. We will also transfuse 1 unit PL as she is thrombocytopenic and this is a major abdominal operation This note I spent greater than 40 minutes in this encounter including personally reviewing medical records, imaging studies, placing orders, coordinating her care and performing appropriate mentation.    Caroleen Hamman, MD [facility] General Surgeon

## 2022-02-12 MED ORDER — CHLORHEXIDINE GLUCONATE CLOTH 2 % EX PADS: 6.0000 | MEDICATED_PAD | Freq: Once | CUTANEOUS | Status: DC

## 2022-02-12 MED ORDER — ACETAMINOPHEN 500 MG PO TABS: 1000.0000 mg | ORAL_TABLET | ORAL | Status: AC

## 2022-02-12 MED ORDER — ALVIMOPAN 12 MG PO CAPS: 12.0000 mg | ORAL_CAPSULE | ORAL | Status: AC

## 2022-02-12 MED ORDER — ORAL CARE MOUTH RINSE: 15.0000 mL | Freq: Once | OROMUCOSAL | Status: AC

## 2022-02-12 MED ORDER — SODIUM CHLORIDE 0.9 % IV SOLN
2.0000 g | INTRAVENOUS | Status: AC
  Administered 2022-02-13: 2 g via INTRAVENOUS

## 2022-02-12 MED ORDER — GABAPENTIN 300 MG PO CAPS: 300.0000 mg | ORAL_CAPSULE | ORAL | Status: AC

## 2022-02-12 MED ORDER — CELECOXIB 200 MG PO CAPS: 200.0000 mg | ORAL_CAPSULE | ORAL | Status: AC

## 2022-02-12 MED ORDER — LACTATED RINGERS IV SOLN: INTRAVENOUS | Status: DC

## 2022-02-12 MED ORDER — CHLORHEXIDINE GLUCONATE 0.12 % MT SOLN: 15.0000 mL | Freq: Once | OROMUCOSAL | Status: AC

## 2022-02-12 MED ORDER — FAMOTIDINE 20 MG PO TABS: 20.0000 mg | ORAL_TABLET | Freq: Once | ORAL | Status: AC

## 2022-02-13 ENCOUNTER — Encounter: Payer: Self-pay | Admitting: Surgery

## 2022-02-13 ENCOUNTER — Inpatient Hospital Stay
Admission: RE | Admit: 2022-02-13 | Discharge: 2023-06-09 | DRG: 326 | Disposition: E | Payer: Medicaid Other | Attending: Surgery | Admitting: Surgery

## 2022-02-13 ENCOUNTER — Encounter: Admission: RE | Disposition: E | Payer: Self-pay | Source: Home / Self Care | Attending: Surgery

## 2022-02-13 ENCOUNTER — Other Ambulatory Visit: Payer: Self-pay

## 2022-02-13 ENCOUNTER — Inpatient Hospital Stay: Payer: Medicaid Other

## 2022-02-13 DIAGNOSIS — S32010A Wedge compression fracture of first lumbar vertebra, initial encounter for closed fracture: Secondary | ICD-10-CM | POA: Diagnosis not present

## 2022-02-13 DIAGNOSIS — R944 Abnormal results of kidney function studies: Secondary | ICD-10-CM | POA: Diagnosis not present

## 2022-02-13 DIAGNOSIS — E8809 Other disorders of plasma-protein metabolism, not elsewhere classified: Secondary | ICD-10-CM | POA: Diagnosis not present

## 2022-02-13 DIAGNOSIS — Y838 Other surgical procedures as the cause of abnormal reaction of the patient, or of later complication, without mention of misadventure at the time of the procedure: Secondary | ICD-10-CM | POA: Diagnosis not present

## 2022-02-13 DIAGNOSIS — Z8673 Personal history of transient ischemic attack (TIA), and cerebral infarction without residual deficits: Secondary | ICD-10-CM

## 2022-02-13 DIAGNOSIS — Z66 Do not resuscitate: Secondary | ICD-10-CM | POA: Diagnosis not present

## 2022-02-13 DIAGNOSIS — E872 Acidosis, unspecified: Secondary | ICD-10-CM | POA: Diagnosis not present

## 2022-02-13 DIAGNOSIS — T451X5A Adverse effect of antineoplastic and immunosuppressive drugs, initial encounter: Secondary | ICD-10-CM | POA: Diagnosis not present

## 2022-02-13 DIAGNOSIS — K432 Incisional hernia without obstruction or gangrene: Principal | ICD-10-CM | POA: Diagnosis present

## 2022-02-13 DIAGNOSIS — E039 Hypothyroidism, unspecified: Secondary | ICD-10-CM | POA: Diagnosis present

## 2022-02-13 DIAGNOSIS — J9811 Atelectasis: Secondary | ICD-10-CM | POA: Diagnosis not present

## 2022-02-13 DIAGNOSIS — T80818A Extravasation of other vesicant agent, initial encounter: Secondary | ICD-10-CM | POA: Diagnosis not present

## 2022-02-13 DIAGNOSIS — D689 Coagulation defect, unspecified: Secondary | ICD-10-CM | POA: Diagnosis not present

## 2022-02-13 DIAGNOSIS — J95821 Acute postprocedural respiratory failure: Secondary | ICD-10-CM | POA: Diagnosis not present

## 2022-02-13 DIAGNOSIS — Y848 Other medical procedures as the cause of abnormal reaction of the patient, or of later complication, without mention of misadventure at the time of the procedure: Secondary | ICD-10-CM | POA: Diagnosis not present

## 2022-02-13 DIAGNOSIS — R54 Age-related physical debility: Secondary | ICD-10-CM | POA: Diagnosis not present

## 2022-02-13 DIAGNOSIS — R571 Hypovolemic shock: Secondary | ICD-10-CM | POA: Diagnosis not present

## 2022-02-13 DIAGNOSIS — E46 Unspecified protein-calorie malnutrition: Secondary | ICD-10-CM | POA: Diagnosis present

## 2022-02-13 DIAGNOSIS — J189 Pneumonia, unspecified organism: Secondary | ICD-10-CM | POA: Diagnosis not present

## 2022-02-13 DIAGNOSIS — R6521 Severe sepsis with septic shock: Secondary | ICD-10-CM | POA: Diagnosis not present

## 2022-02-13 DIAGNOSIS — C187 Malignant neoplasm of sigmoid colon: Secondary | ICD-10-CM | POA: Diagnosis present

## 2022-02-13 DIAGNOSIS — D62 Acute posthemorrhagic anemia: Secondary | ICD-10-CM | POA: Diagnosis not present

## 2022-02-13 DIAGNOSIS — E11649 Type 2 diabetes mellitus with hypoglycemia without coma: Secondary | ICD-10-CM | POA: Diagnosis not present

## 2022-02-13 DIAGNOSIS — J810 Acute pulmonary edema: Secondary | ICD-10-CM

## 2022-02-13 DIAGNOSIS — E1165 Type 2 diabetes mellitus with hyperglycemia: Secondary | ICD-10-CM | POA: Diagnosis present

## 2022-02-13 DIAGNOSIS — K632 Fistula of intestine: Principal | ICD-10-CM | POA: Diagnosis not present

## 2022-02-13 DIAGNOSIS — X501XXA Overexertion from prolonged static or awkward postures, initial encounter: Secondary | ICD-10-CM

## 2022-02-13 DIAGNOSIS — Z79899 Other long term (current) drug therapy: Secondary | ICD-10-CM

## 2022-02-13 DIAGNOSIS — D72819 Decreased white blood cell count, unspecified: Secondary | ICD-10-CM | POA: Diagnosis present

## 2022-02-13 DIAGNOSIS — D6959 Other secondary thrombocytopenia: Secondary | ICD-10-CM | POA: Diagnosis present

## 2022-02-13 DIAGNOSIS — B952 Enterococcus as the cause of diseases classified elsewhere: Secondary | ICD-10-CM | POA: Diagnosis not present

## 2022-02-13 DIAGNOSIS — K660 Peritoneal adhesions (postprocedural) (postinfection): Secondary | ICD-10-CM | POA: Diagnosis present

## 2022-02-13 DIAGNOSIS — Z515 Encounter for palliative care: Secondary | ICD-10-CM | POA: Diagnosis not present

## 2022-02-13 DIAGNOSIS — E871 Hypo-osmolality and hyponatremia: Secondary | ICD-10-CM | POA: Diagnosis not present

## 2022-02-13 DIAGNOSIS — Z603 Acculturation difficulty: Secondary | ICD-10-CM | POA: Diagnosis present

## 2022-02-13 DIAGNOSIS — N179 Acute kidney failure, unspecified: Secondary | ICD-10-CM | POA: Diagnosis present

## 2022-02-13 DIAGNOSIS — K651 Peritoneal abscess: Secondary | ICD-10-CM | POA: Diagnosis not present

## 2022-02-13 DIAGNOSIS — Z9221 Personal history of antineoplastic chemotherapy: Secondary | ICD-10-CM

## 2022-02-13 DIAGNOSIS — Z9049 Acquired absence of other specified parts of digestive tract: Secondary | ICD-10-CM

## 2022-02-13 DIAGNOSIS — A419 Sepsis, unspecified organism: Secondary | ICD-10-CM | POA: Diagnosis not present

## 2022-02-13 DIAGNOSIS — T8131XA Disruption of external operation (surgical) wound, not elsewhere classified, initial encounter: Secondary | ICD-10-CM | POA: Diagnosis not present

## 2022-02-13 DIAGNOSIS — E870 Hyperosmolality and hypernatremia: Secondary | ICD-10-CM | POA: Diagnosis not present

## 2022-02-13 DIAGNOSIS — B9620 Unspecified Escherichia coli [E. coli] as the cause of diseases classified elsewhere: Secondary | ICD-10-CM | POA: Diagnosis not present

## 2022-02-13 DIAGNOSIS — R17 Unspecified jaundice: Secondary | ICD-10-CM | POA: Diagnosis not present

## 2022-02-13 DIAGNOSIS — D509 Iron deficiency anemia, unspecified: Secondary | ICD-10-CM | POA: Diagnosis not present

## 2022-02-13 DIAGNOSIS — J9601 Acute respiratory failure with hypoxia: Secondary | ICD-10-CM

## 2022-02-13 DIAGNOSIS — G9341 Metabolic encephalopathy: Secondary | ICD-10-CM | POA: Diagnosis not present

## 2022-02-13 DIAGNOSIS — K631 Perforation of intestine (nontraumatic): Secondary | ICD-10-CM | POA: Diagnosis not present

## 2022-02-13 DIAGNOSIS — T82868A Thrombosis of vascular prosthetic devices, implants and grafts, initial encounter: Secondary | ICD-10-CM | POA: Diagnosis present

## 2022-02-13 DIAGNOSIS — Z8719 Personal history of other diseases of the digestive system: Secondary | ICD-10-CM

## 2022-02-13 DIAGNOSIS — I82612 Acute embolism and thrombosis of superficial veins of left upper extremity: Secondary | ICD-10-CM | POA: Diagnosis not present

## 2022-02-13 DIAGNOSIS — M7989 Other specified soft tissue disorders: Secondary | ICD-10-CM

## 2022-02-13 DIAGNOSIS — I959 Hypotension, unspecified: Secondary | ICD-10-CM | POA: Diagnosis not present

## 2022-02-13 DIAGNOSIS — I5030 Unspecified diastolic (congestive) heart failure: Secondary | ICD-10-CM | POA: Diagnosis not present

## 2022-02-13 DIAGNOSIS — C786 Secondary malignant neoplasm of retroperitoneum and peritoneum: Secondary | ICD-10-CM | POA: Diagnosis present

## 2022-02-13 DIAGNOSIS — Z79890 Hormone replacement therapy (postmenopausal): Secondary | ICD-10-CM

## 2022-02-13 DIAGNOSIS — K567 Ileus, unspecified: Secondary | ICD-10-CM | POA: Diagnosis not present

## 2022-02-13 DIAGNOSIS — G8929 Other chronic pain: Secondary | ICD-10-CM | POA: Diagnosis present

## 2022-02-13 DIAGNOSIS — T8143XA Infection following a procedure, organ and space surgical site, initial encounter: Secondary | ICD-10-CM | POA: Diagnosis not present

## 2022-02-13 DIAGNOSIS — Z833 Family history of diabetes mellitus: Secondary | ICD-10-CM

## 2022-02-13 DIAGNOSIS — Z6841 Body Mass Index (BMI) 40.0 and over, adult: Secondary | ICD-10-CM

## 2022-02-13 DIAGNOSIS — E876 Hypokalemia: Secondary | ICD-10-CM | POA: Diagnosis not present

## 2022-02-13 DIAGNOSIS — Z597 Insufficient social insurance and welfare support: Secondary | ICD-10-CM

## 2022-02-13 DIAGNOSIS — Z923 Personal history of irradiation: Secondary | ICD-10-CM

## 2022-02-13 DIAGNOSIS — E781 Pure hyperglyceridemia: Secondary | ICD-10-CM | POA: Diagnosis present

## 2022-02-13 DIAGNOSIS — I82621 Acute embolism and thrombosis of deep veins of right upper extremity: Secondary | ICD-10-CM | POA: Diagnosis not present

## 2022-02-13 DIAGNOSIS — Y95 Nosocomial condition: Secondary | ICD-10-CM | POA: Diagnosis not present

## 2022-02-13 DIAGNOSIS — B348 Other viral infections of unspecified site: Secondary | ICD-10-CM | POA: Diagnosis not present

## 2022-02-13 DIAGNOSIS — E860 Dehydration: Secondary | ICD-10-CM | POA: Diagnosis present

## 2022-02-13 DIAGNOSIS — B3789 Other sites of candidiasis: Secondary | ICD-10-CM | POA: Diagnosis not present

## 2022-02-13 HISTORY — PX: VENTRAL HERNIA REPAIR: SHX424

## 2022-02-13 HISTORY — PX: APPENDECTOMY: SHX54

## 2022-02-13 HISTORY — PX: INSERTION OF MESH: SHX5868

## 2022-02-13 HISTORY — PX: ABDOMINAL WALL DEFECT REPAIR: SHX53

## 2022-02-13 HISTORY — PX: COLON RESECTION SIGMOID: SHX6737

## 2022-02-13 LAB — CBC
HCT: 37.6 % (ref 36.0–46.0)
HCT: 28.5 % — ABNORMAL LOW (ref 36.0–46.0)
Hemoglobin: 12.7 g/dL (ref 12.0–15.0)
Hemoglobin: 9.8 g/dL — ABNORMAL LOW (ref 12.0–15.0)
MCH: 28.6 pg (ref 26.0–34.0)
MCH: 29.3 pg (ref 26.0–34.0)
MCHC: 33.8 g/dL (ref 30.0–36.0)
MCHC: 34.4 g/dL (ref 30.0–36.0)
MCV: 84.7 fL (ref 80.0–100.0)
MCV: 85.3 fL (ref 80.0–100.0)
Platelets: 62 10*3/uL — ABNORMAL LOW (ref 150–400)
Platelets: 72 10*3/uL — ABNORMAL LOW (ref 150–400)
RBC: 4.44 MIL/uL (ref 3.87–5.11)
RBC: 3.34 MIL/uL — ABNORMAL LOW (ref 3.87–5.11)
RDW: 13.5 % (ref 11.5–15.5)
RDW: 13.4 % (ref 11.5–15.5)
WBC: 3.8 10*3/uL — ABNORMAL LOW (ref 4.0–10.5)
WBC: 8.2 10*3/uL (ref 4.0–10.5)
nRBC: 0 % (ref 0.0–0.2)
nRBC: 0 % (ref 0.0–0.2)

## 2022-02-13 LAB — PROTIME-INR
INR: 1.5 — ABNORMAL HIGH (ref 0.8–1.2)
Prothrombin Time: 17.6 seconds — ABNORMAL HIGH (ref 11.4–15.2)

## 2022-02-13 LAB — APTT: aPTT: 33 seconds (ref 24–36)

## 2022-02-13 SURGERY — COLECTOMY, SIGMOID, OPEN
Anesthesia: General | Site: Abdomen

## 2022-02-13 MED ORDER — METHOCARBAMOL 500 MG PO TABS
500.0000 mg | ORAL_TABLET | Freq: Three times a day (TID) | ORAL | Status: DC | PRN
  Administered 2022-07-14 – 2023-01-19 (×15): 500 mg via ORAL
  Filled 2022-02-13 (×18): qty 1

## 2022-02-13 MED ORDER — MIDAZOLAM HCL 2 MG/2ML IJ SOLN
INTRAMUSCULAR | Status: DC | PRN
  Administered 2022-02-13: 2 mg via INTRAVENOUS

## 2022-02-13 MED ORDER — DIPHENHYDRAMINE HCL 12.5 MG/5ML PO ELIX
12.5000 mg | ORAL_SOLUTION | Freq: Four times a day (QID) | ORAL | Status: DC | PRN
  Filled 2022-02-13: qty 5

## 2022-02-13 MED ORDER — BUPIVACAINE-EPINEPHRINE (PF) 0.25% -1:200000 IJ SOLN
INTRAMUSCULAR | Status: AC
Start: 2022-02-13 — End: ?
  Filled 2022-02-13: qty 30

## 2022-02-13 MED ORDER — SODIUM CHLORIDE 0.9 % IV SOLN
INTRAVENOUS | Status: AC
  Filled 2022-02-13: qty 2

## 2022-02-13 MED ORDER — LIDOCAINE HCL (PF) 2 % IJ SOLN
INTRAMUSCULAR | Status: AC
  Filled 2022-02-13: qty 5

## 2022-02-13 MED ORDER — SODIUM CHLORIDE 0.9 % IV SOLN
2.0000 g | Freq: Two times a day (BID) | INTRAVENOUS | Status: DC
  Administered 2022-02-13 – 2022-02-14 (×3): 2 g via INTRAVENOUS
  Filled 2022-02-13 (×4): qty 2

## 2022-02-13 MED ORDER — FENTANYL CITRATE (PF) 100 MCG/2ML IJ SOLN
INTRAMUSCULAR | Status: DC | PRN
Start: 2022-02-13 — End: 2022-02-13
  Administered 2022-02-13 (×2): 50 ug via INTRAVENOUS

## 2022-02-13 MED ORDER — FENTANYL CITRATE (PF) 100 MCG/2ML IJ SOLN
INTRAMUSCULAR | Status: AC
  Filled 2022-02-13: qty 2

## 2022-02-13 MED ORDER — FAMOTIDINE 20 MG PO TABS
ORAL_TABLET | ORAL | Status: AC
  Administered 2022-02-13: 20 mg via ORAL
  Filled 2022-02-13: qty 1

## 2022-02-13 MED ORDER — ALVIMOPAN 12 MG PO CAPS
ORAL_CAPSULE | ORAL | Status: AC
  Administered 2022-02-13: 12 mg via ORAL
  Filled 2022-02-13: qty 1

## 2022-02-13 MED ORDER — DEXAMETHASONE SODIUM PHOSPHATE 10 MG/ML IJ SOLN
INTRAMUSCULAR | Status: DC | PRN
  Administered 2022-02-13: 10 mg via INTRAVENOUS

## 2022-02-13 MED ORDER — PREGABALIN 50 MG PO CAPS
100.0000 mg | ORAL_CAPSULE | Freq: Three times a day (TID) | ORAL | Status: DC
  Administered 2022-02-13 – 2022-02-14 (×4): 100 mg via ORAL
  Filled 2022-02-13 (×4): qty 2

## 2022-02-13 MED ORDER — MIDAZOLAM HCL 2 MG/2ML IJ SOLN
INTRAMUSCULAR | Status: AC
  Filled 2022-02-13: qty 2

## 2022-02-13 MED ORDER — ONDANSETRON HCL 4 MG/2ML IJ SOLN
4.0000 mg | Freq: Four times a day (QID) | INTRAMUSCULAR | Status: DC | PRN
  Administered 2022-02-15 – 2023-05-03 (×97): 4 mg via INTRAVENOUS
  Filled 2022-02-13 (×107): qty 2

## 2022-02-13 MED ORDER — EPHEDRINE 5 MG/ML INJ
INTRAVENOUS | Status: AC
  Filled 2022-02-13: qty 5

## 2022-02-13 MED ORDER — FENTANYL CITRATE (PF) 100 MCG/2ML IJ SOLN
25.0000 ug | INTRAMUSCULAR | Status: DC | PRN
  Administered 2022-02-13: 25 ug via INTRAVENOUS

## 2022-02-13 MED ORDER — PROCHLORPERAZINE EDISYLATE 10 MG/2ML IJ SOLN
5.0000 mg | Freq: Four times a day (QID) | INTRAMUSCULAR | Status: DC | PRN
  Administered 2022-03-03 – 2022-03-29 (×3): 10 mg via INTRAVENOUS
  Administered 2022-07-27: 5 mg via INTRAVENOUS
  Filled 2022-02-13 (×7): qty 2

## 2022-02-13 MED ORDER — ACETAMINOPHEN 10 MG/ML IV SOLN
INTRAVENOUS | Status: DC | PRN
  Administered 2022-02-13: 1000 mg via INTRAVENOUS

## 2022-02-13 MED ORDER — DEXAMETHASONE SODIUM PHOSPHATE 10 MG/ML IJ SOLN
INTRAMUSCULAR | Status: AC
  Filled 2022-02-13: qty 1

## 2022-02-13 MED ORDER — BUPIVACAINE-EPINEPHRINE (PF) 0.25% -1:200000 IJ SOLN: INTRAMUSCULAR | Status: DC | PRN

## 2022-02-13 MED ORDER — SODIUM CHLORIDE (PF) 0.9 % IJ SOLN
INTRAMUSCULAR | Status: AC
  Filled 2022-02-13: qty 50

## 2022-02-13 MED ORDER — KETAMINE HCL 50 MG/5ML IJ SOSY
PREFILLED_SYRINGE | INTRAMUSCULAR | Status: AC
  Filled 2022-02-13: qty 5

## 2022-02-13 MED ORDER — ROCURONIUM BROMIDE 10 MG/ML (PF) SYRINGE
PREFILLED_SYRINGE | INTRAVENOUS | Status: AC
  Filled 2022-02-13: qty 10

## 2022-02-13 MED ORDER — ONDANSETRON HCL 4 MG/2ML IJ SOLN
4.0000 mg | Freq: Once | INTRAMUSCULAR | Status: DC | PRN
Start: 2022-02-13 — End: 2022-02-13

## 2022-02-13 MED ORDER — OXYCODONE HCL 5 MG PO TABS
5.0000 mg | ORAL_TABLET | ORAL | Status: DC | PRN
  Administered 2022-02-14: 10 mg via ORAL
  Administered 2022-04-23 – 2022-04-30 (×2): 5 mg via ORAL
  Administered 2022-05-01 – 2022-05-02 (×3): 10 mg via ORAL
  Administered 2022-05-04 – 2022-05-09 (×2): 5 mg via ORAL
  Administered 2022-06-21: 10 mg via ORAL
  Administered 2022-07-09: 5 mg via ORAL
  Administered 2022-07-10: 10 mg via ORAL
  Administered 2022-07-10: 5 mg via ORAL
  Administered 2022-07-10 – 2022-07-18 (×6): 10 mg via ORAL
  Administered 2022-07-18: 5 mg via ORAL
  Administered 2022-07-19 – 2022-08-14 (×7): 10 mg via ORAL
  Administered 2022-08-15 – 2022-08-18 (×5): 5 mg via ORAL
  Administered 2022-08-18: 10 mg via ORAL
  Administered 2022-08-18: 5 mg via ORAL
  Administered 2022-08-20 – 2022-08-29 (×9): 10 mg via ORAL
  Administered 2022-09-01: 5 mg via ORAL
  Administered 2022-09-18 – 2022-09-23 (×2): 10 mg via ORAL
  Administered 2022-09-26: 5 mg via ORAL
  Administered 2022-09-26 – 2022-10-02 (×3): 10 mg via ORAL
  Administered 2022-10-25: 5 mg via ORAL
  Administered 2022-12-19: 10 mg via ORAL
  Administered 2022-12-23: 5 mg via ORAL
  Administered 2022-12-27 – 2022-12-28 (×3): 10 mg via ORAL
  Administered 2022-12-31: 5 mg via ORAL
  Administered 2023-01-01 – 2023-03-01 (×3): 10 mg via ORAL
  Filled 2022-02-13 (×7): qty 2
  Filled 2022-02-13: qty 1
  Filled 2022-02-13 (×2): qty 2
  Filled 2022-02-13: qty 1
  Filled 2022-02-13: qty 2
  Filled 2022-02-13 (×2): qty 1
  Filled 2022-02-13 (×2): qty 2
  Filled 2022-02-13: qty 1
  Filled 2022-02-13 (×2): qty 2
  Filled 2022-02-13: qty 1
  Filled 2022-02-13: qty 2
  Filled 2022-02-13: qty 1
  Filled 2022-02-13: qty 2
  Filled 2022-02-13 (×2): qty 1
  Filled 2022-02-13 (×7): qty 2
  Filled 2022-02-13 (×3): qty 1
  Filled 2022-02-13 (×5): qty 2
  Filled 2022-02-13 (×2): qty 1
  Filled 2022-02-13 (×3): qty 2
  Filled 2022-02-13: qty 1
  Filled 2022-02-13 (×7): qty 2
  Filled 2022-02-13: qty 1
  Filled 2022-02-13 (×3): qty 2
  Filled 2022-02-13: qty 1
  Filled 2022-02-13 (×2): qty 2
  Filled 2022-02-13: qty 1
  Filled 2022-02-13 (×2): qty 2
  Filled 2022-02-13: qty 1
  Filled 2022-02-13 (×3): qty 2

## 2022-02-13 MED ORDER — LIDOCAINE HCL (CARDIAC) PF 100 MG/5ML IV SOSY
PREFILLED_SYRINGE | INTRAVENOUS | Status: DC | PRN
  Administered 2022-02-13: 80 mg via INTRAVENOUS

## 2022-02-13 MED ORDER — MORPHINE SULFATE (PF) 2 MG/ML IV SOLN
2.0000 mg | INTRAVENOUS | Status: DC | PRN
  Administered 2022-02-15 – 2022-10-03 (×28): 2 mg via INTRAVENOUS
  Filled 2022-02-13 (×29): qty 1

## 2022-02-13 MED ORDER — SODIUM CHLORIDE 0.9% IV SOLUTION: Freq: Once | INTRAVENOUS | Status: DC

## 2022-02-13 MED ORDER — ACETAMINOPHEN 500 MG PO TABS
1000.0000 mg | ORAL_TABLET | Freq: Four times a day (QID) | ORAL | Status: DC
Start: 2022-02-13 — End: 2022-02-16
  Administered 2022-02-13 – 2022-02-16 (×8): 1000 mg via ORAL
  Filled 2022-02-13 (×9): qty 2

## 2022-02-13 MED ORDER — CHLORHEXIDINE GLUCONATE 0.12 % MT SOLN
OROMUCOSAL | Status: AC
  Administered 2022-02-13: 15 mL via OROMUCOSAL
  Filled 2022-02-13: qty 15

## 2022-02-13 MED ORDER — KETAMINE HCL 10 MG/ML IJ SOLN
INTRAMUSCULAR | Status: DC | PRN
  Administered 2022-02-13 (×2): 10 mg via INTRAVENOUS
  Administered 2022-02-13: 40 mg via INTRAVENOUS
  Administered 2022-02-13 (×2): 10 mg via INTRAVENOUS

## 2022-02-13 MED ORDER — ROCURONIUM BROMIDE 100 MG/10ML IV SOLN
INTRAVENOUS | Status: DC | PRN
  Administered 2022-02-13: 30 mg via INTRAVENOUS
  Administered 2022-02-13: 40 mg via INTRAVENOUS
  Administered 2022-02-13: 30 mg via INTRAVENOUS
  Administered 2022-02-13: 10 mg via INTRAVENOUS
  Administered 2022-02-13: 60 mg via INTRAVENOUS
  Administered 2022-02-13 (×2): 30 mg via INTRAVENOUS

## 2022-02-13 MED ORDER — VISTASEAL 10 ML SINGLE DOSE KIT
PACK | CUTANEOUS | Status: AC
  Filled 2022-02-13: qty 10

## 2022-02-13 MED ORDER — PROCHLORPERAZINE MALEATE 10 MG PO TABS
10.0000 mg | ORAL_TABLET | Freq: Four times a day (QID) | ORAL | Status: DC | PRN
  Filled 2022-02-13: qty 1

## 2022-02-13 MED ORDER — KETOROLAC TROMETHAMINE 30 MG/ML IJ SOLN
30.0000 mg | Freq: Four times a day (QID) | INTRAMUSCULAR | Status: DC
  Administered 2022-02-13 – 2022-02-14 (×5): 30 mg via INTRAVENOUS
  Filled 2022-02-13 (×5): qty 1

## 2022-02-13 MED ORDER — EPHEDRINE SULFATE (PRESSORS) 50 MG/ML IJ SOLN
INTRAMUSCULAR | Status: DC | PRN
  Administered 2022-02-13: 5 mg via INTRAVENOUS

## 2022-02-13 MED ORDER — ONDANSETRON HCL 4 MG/2ML IJ SOLN
INTRAMUSCULAR | Status: DC | PRN
  Administered 2022-02-13: 4 mg via INTRAVENOUS

## 2022-02-13 MED ORDER — ONDANSETRON HCL 4 MG/2ML IJ SOLN
INTRAMUSCULAR | Status: AC
Start: 2022-02-13 — End: ?
  Filled 2022-02-13: qty 2

## 2022-02-13 MED ORDER — ONDANSETRON 4 MG PO TBDP
4.0000 mg | ORAL_TABLET | Freq: Four times a day (QID) | ORAL | Status: DC | PRN
  Administered 2022-03-17 – 2023-04-25 (×25): 4 mg via ORAL
  Filled 2022-02-13 (×32): qty 1

## 2022-02-13 MED ORDER — SODIUM CHLORIDE 0.9 % IV SOLN: INTRAVENOUS | Status: DC

## 2022-02-13 MED ORDER — DIPHENHYDRAMINE HCL 50 MG/ML IJ SOLN: 12.5000 mg | Freq: Four times a day (QID) | INTRAMUSCULAR | Status: DC | PRN

## 2022-02-13 MED ORDER — SEVOFLURANE IN SOLN
RESPIRATORY_TRACT | Status: AC
  Filled 2022-02-13: qty 250

## 2022-02-13 MED ORDER — ALBUMIN HUMAN 5 % IV SOLN: INTRAVENOUS | Status: DC | PRN

## 2022-02-13 MED ORDER — DEXMEDETOMIDINE HCL IN NACL 200 MCG/50ML IV SOLN
INTRAVENOUS | Status: DC | PRN
  Administered 2022-02-13: 4 ug via INTRAVENOUS
  Administered 2022-02-13 (×2): 8 ug via INTRAVENOUS

## 2022-02-13 MED ORDER — SEPRAFILM FOR OPTIME
ORAL_FILM | TOPICAL | Status: DC | PRN
  Administered 2022-02-13: 3 via TOPICAL

## 2022-02-13 MED ORDER — VISTASEAL 10 ML SINGLE DOSE KIT
PACK | CUTANEOUS | Status: DC | PRN
  Administered 2022-02-13: 10 mL via TOPICAL

## 2022-02-13 MED ORDER — BUPIVACAINE LIPOSOME 1.3 % IJ SUSP
INTRAMUSCULAR | Status: AC
Start: 2022-02-13 — End: ?
  Filled 2022-02-13: qty 20

## 2022-02-13 MED ORDER — DEXMEDETOMIDINE HCL IN NACL 80 MCG/20ML IV SOLN
INTRAVENOUS | Status: AC
  Filled 2022-02-13: qty 20

## 2022-02-13 MED ORDER — CELECOXIB 200 MG PO CAPS
ORAL_CAPSULE | ORAL | Status: AC
  Administered 2022-02-13: 200 mg via ORAL
  Filled 2022-02-13: qty 1

## 2022-02-13 MED ORDER — 0.9 % SODIUM CHLORIDE (POUR BTL) OPTIME
TOPICAL | Status: DC | PRN
  Administered 2022-02-13: 100 mL

## 2022-02-13 MED ORDER — LEVOTHYROXINE SODIUM 137 MCG PO TABS
137.0000 ug | ORAL_TABLET | Freq: Every day | ORAL | Status: DC
  Administered 2022-02-14 – 2022-02-16 (×2): 137 ug via ORAL
  Filled 2022-02-13 (×9): qty 1

## 2022-02-13 MED ORDER — METHOCARBAMOL 1000 MG/10ML IJ SOLN
500.0000 mg | Freq: Three times a day (TID) | INTRAVENOUS | Status: DC | PRN
  Administered 2023-04-30 – 2023-05-01 (×2): 500 mg via INTRAVENOUS
  Filled 2022-02-13: qty 454.55
  Filled 2022-02-13: qty 5

## 2022-02-13 MED ORDER — SODIUM CHLORIDE 0.9 % IV SOLN
2.0000 g | Freq: Once | INTRAVENOUS | Status: AC
  Administered 2022-02-13: 2 g via INTRAVENOUS

## 2022-02-13 MED ORDER — PROPOFOL 10 MG/ML IV BOLUS
INTRAVENOUS | Status: AC
  Filled 2022-02-13: qty 20

## 2022-02-13 MED ORDER — ALBUMIN HUMAN 5 % IV SOLN
INTRAVENOUS | Status: AC
  Filled 2022-02-13: qty 250

## 2022-02-13 MED ORDER — SUGAMMADEX SODIUM 200 MG/2ML IV SOLN
INTRAVENOUS | Status: DC | PRN
  Administered 2022-02-13: 200 mg via INTRAVENOUS

## 2022-02-13 MED ORDER — PANTOPRAZOLE SODIUM 40 MG IV SOLR
40.0000 mg | Freq: Two times a day (BID) | INTRAVENOUS | Status: DC
  Administered 2022-02-13 – 2022-02-24 (×22): 40 mg via INTRAVENOUS
  Filled 2022-02-13 (×22): qty 10

## 2022-02-13 MED ORDER — PROPOFOL 10 MG/ML IV BOLUS
INTRAVENOUS | Status: DC | PRN
  Administered 2022-02-13: 150 mg via INTRAVENOUS

## 2022-02-13 MED ORDER — LACTATED RINGERS IV SOLN: INTRAVENOUS | Status: DC | PRN

## 2022-02-13 MED ORDER — ACETAMINOPHEN 500 MG PO TABS
ORAL_TABLET | ORAL | Status: AC
  Administered 2022-02-13: 1000 mg via ORAL
  Filled 2022-02-13: qty 2

## 2022-02-13 MED ORDER — SODIUM CHLORIDE (PF) 0.9 % IJ SOLN
INTRAMUSCULAR | Status: DC | PRN
  Administered 2022-02-13: 100 mL

## 2022-02-13 MED ORDER — PHENYLEPHRINE HCL (PRESSORS) 10 MG/ML IV SOLN
INTRAVENOUS | Status: DC | PRN
  Administered 2022-02-13 (×4): 160 ug via INTRAVENOUS
  Administered 2022-02-13: 80 ug via INTRAVENOUS
  Administered 2022-02-13: 160 ug via INTRAVENOUS
  Administered 2022-02-13: 80 ug via INTRAVENOUS

## 2022-02-13 MED ORDER — GABAPENTIN 300 MG PO CAPS
ORAL_CAPSULE | ORAL | Status: AC
  Administered 2022-02-13: 300 mg via ORAL
  Filled 2022-02-13: qty 1

## 2022-02-13 MED ORDER — ACETAMINOPHEN 10 MG/ML IV SOLN
INTRAVENOUS | Status: AC
  Filled 2022-02-13: qty 100

## 2022-02-13 SURGICAL SUPPLY — 48 items
APL PRP STRL LF DISP 70% ISPRP (MISCELLANEOUS) ×1
BARRIER ADH SEPRAFILM 3INX5IN (MISCELLANEOUS) ×3 IMPLANT
BLADE CLIPPER SURG (BLADE) ×2 IMPLANT
BRR ADH 5X3 SEPRAFILM 2 SHT (MISCELLANEOUS) ×3
BRR ADH 6X5 SEPRAFILM 1 SHT (MISCELLANEOUS) ×1
BULB RESERV EVAC DRAIN JP 100C (MISCELLANEOUS) ×1 IMPLANT
CHLORAPREP W/TINT 26 (MISCELLANEOUS) ×2 IMPLANT
DRAIN CHANNEL 19F RND (DRAIN) ×1 IMPLANT
DRAPE LAPAROTOMY 100X77 ABD (DRAPES) ×2 IMPLANT
ELECT BLADE 6.5 EXT (BLADE) ×2 IMPLANT
ELECT CAUTERY BLADE 6.4 (BLADE) ×2 IMPLANT
ELECT REM PT RETURN 9FT ADLT (ELECTROSURGICAL) ×2
ELECTRODE REM PT RTRN 9FT ADLT (ELECTROSURGICAL) ×1 IMPLANT
GAUZE SPONGE 4X4 12PLY STRL (GAUZE/BANDAGES/DRESSINGS) ×2 IMPLANT
GLOVE BIO SURGEON STRL SZ7 (GLOVE) ×20 IMPLANT
GOWN STRL REUS W/ TWL LRG LVL3 (GOWN DISPOSABLE) ×4 IMPLANT
GOWN STRL REUS W/TWL LRG LVL3 (GOWN DISPOSABLE) ×20
HANDLE SUCTION POOLE (INSTRUMENTS) ×1 IMPLANT
HOLDER FOLEY CATH W/STRAP (MISCELLANEOUS) ×2 IMPLANT
KIT TURNOVER CYSTO (KITS) ×2 IMPLANT
LIGASURE IMPACT 36 18CM CVD LR (INSTRUMENTS) ×2 IMPLANT
MANIFOLD NEPTUNE II (INSTRUMENTS) ×2 IMPLANT
MESH PHASIX RESORB RECT 15X20 (Mesh General) ×1 IMPLANT
MESH PHASIX RESORB RECT 20X25 (Mesh General) ×1 IMPLANT
NDL HYPO 25X1 1.5 SAFETY (NEEDLE) ×1 IMPLANT
NEEDLE HYPO 22GX1.5 SAFETY (NEEDLE) ×2 IMPLANT
NEEDLE HYPO 25X1 1.5 SAFETY (NEEDLE) ×2 IMPLANT
NS IRRIG 1000ML POUR BTL (IV SOLUTION) ×2 IMPLANT
PACK BASIN MINOR ARMC (MISCELLANEOUS) ×2 IMPLANT
PACK COLON CLEAN CLOSURE (MISCELLANEOUS) ×1 IMPLANT
PREVENA INCISION MGT 90 150 (MISCELLANEOUS) ×1 IMPLANT
SEPRAFILM MEMBRANE 5X6 (MISCELLANEOUS) ×1 IMPLANT
SPONGE KITTNER 5P (MISCELLANEOUS) ×3 IMPLANT
SPONGE T-LAP 18X18 ~~LOC~~+RFID (SPONGE) ×19 IMPLANT
STAPLER PROXIMATE 75MM BLUE (STAPLE) ×1 IMPLANT
SUCTION POOLE HANDLE (INSTRUMENTS) ×2
SUT ETHIBOND 0 MO6 C/R (SUTURE) ×2 IMPLANT
SUT ETHILON 3-0 FS-10 30 BLK (SUTURE) ×2
SUT PDS AB 1 CT1 27 (SUTURE) ×4 IMPLANT
SUT VIC AB 2-0 SH 27 (SUTURE) ×2
SUT VIC AB 2-0 SH 27XBRD (SUTURE) ×2 IMPLANT
SUT VIC AB 3-0 SH 27 (SUTURE) ×2
SUT VIC AB 3-0 SH 27X BRD (SUTURE) ×2 IMPLANT
SUTURE EHLN 3-0 FS-10 30 BLK (SUTURE) IMPLANT
SYR 20ML LL LF (SYRINGE) ×4 IMPLANT
TOWEL OR 17X26 4PK STRL BLUE (TOWEL DISPOSABLE) ×2 IMPLANT
TRAY FOLEY MTR SLVR 16FR STAT (SET/KITS/TRAYS/PACK) ×2 IMPLANT
WATER STERILE IRR 1000ML POUR (IV SOLUTION) ×2 IMPLANT

## 2022-02-13 NOTE — Anesthesia Preprocedure Evaluation (Signed)
Anesthesia Evaluation  Patient identified by MRN, date of birth, ID band Patient awake    Reviewed: Allergy & Precautions, H&P , NPO status , Patient's Chart, lab work & pertinent test results, reviewed documented beta blocker date and time   Airway Mallampati: II  TM Distance: >3 FB Neck ROM: full    Dental  (+) Teeth Intact   Pulmonary neg pulmonary ROS,    Pulmonary exam normal        Cardiovascular Exercise Tolerance: Good negative cardio ROS Normal cardiovascular exam Rhythm:regular Rate:Normal     Neuro/Psych negative neurological ROS  negative psych ROS   GI/Hepatic negative GI ROS, Neg liver ROS,   Endo/Other  Hypothyroidism Morbid obesity  Renal/GU negative Renal ROS  negative genitourinary   Musculoskeletal   Abdominal   Peds  Hematology  (+) Blood dyscrasia, anemia ,   Anesthesia Other Findings Past Medical History: No date: Anemia 04/2020: Colon cancer ([place]) No date: History of kidney stones No date: Hypothyroidism No date: Thyroid disease Past Surgical History: 11/21/2019: COLONOSCOPY WITH PROPOFOL; N/A     Comment:  Procedure: COLONOSCOPY WITH PROPOFOL;  Surgeon: Lin Landsman, MD;  Location: [facility] ENDOSCOPY;  Service:               Gastroenterology;  Laterality: N/A; 07/10/2021: COLONOSCOPY WITH PROPOFOL; N/A     Comment:  Procedure: COLONOSCOPY WITH PROPOFOL;  Surgeon: Lin Landsman, MD;  Location: [facility] ENDOSCOPY;  Service:               Gastroenterology;  Laterality: N/A; 02/06/2022: COLONOSCOPY WITH PROPOFOL; N/A     Comment:  Procedure: COLONOSCOPY WITH PROPOFOL;  Surgeon: Lin Landsman, MD;  Location: [facility] ENDOSCOPY;  Service:               Gastroenterology;  Laterality: N/A;  SPANISH 01/30/2020: CYSTOSCOPY WITH STENT PLACEMENT; Left     Comment:  Procedure: CYSTOSCOPY WITH STENT PLACEMENT;  Surgeon:               Hollice Espy,  MD;  Location: [facility] ORS;  Service:               Urology;  Laterality: Left; 11/21/2019: ESOPHAGOGASTRODUODENOSCOPY (EGD) WITH PROPOFOL; N/A     Comment:  Procedure: ESOPHAGOGASTRODUODENOSCOPY (EGD) WITH               PROPOFOL;  Surgeon: Lin Landsman, MD;  Location:               [facility] ENDOSCOPY;  Service: Gastroenterology;  Laterality:               N/A; 07/03/2020: IR RADIOLOGIST EVAL & MGMT 07/12/2020: IR RADIOLOGIST EVAL & MGMT 01/30/2020: PARTIAL COLECTOMY; N/A     Comment:  Procedure: PARTIAL COLECTOMY Sigmoid;  Surgeon: [name], MD;  Location: [facility] ORS;  Service: General;                Laterality: N/A; 03/28/2020: PORTACATH PLACEMENT; Left     Comment:  Procedure: INSERTION PORT-A-CATH;  Surgeon: [name], MD;  Location: [facility] ORS;  Service: General;  Laterality: Left; 08/29/2020: PORTACATH PLACEMENT; N/A     Comment:  Procedure: INSERTION PORT-A-CATH, chemotherapy;                Surgeon: [name], MD;  Location: [facility] ORS;                Service: General;  Laterality: N/A; BMI    Body Mass Index: 39.79 kg/m     Reproductive/Obstetrics negative OB ROS                             Anesthesia Physical Anesthesia Plan  ASA: 3  Anesthesia Plan: General ETT   Post-op Pain Management:    Induction:   PONV Risk Score and Plan:   Airway Management Planned:   Additional Equipment:   Intra-op Plan:   Post-operative Plan:   Informed Consent: I have reviewed the patients History and Physical, chart, labs and discussed the procedure including the risks, benefits and alternatives for the proposed anesthesia with the patient or authorized representative who has indicated his/her understanding and acceptance.     Dental Advisory Given  Plan Discussed with: CRNA  Anesthesia Plan Comments:         Anesthesia Quick Evaluation

## 2022-02-13 NOTE — OR Nursing (Signed)
Interpreter on a stick used with [employer] [phone]

## 2022-02-13 NOTE — Plan of Care (Signed)

## 2022-02-13 NOTE — Transfer of Care (Signed)
Immediate Anesthesia Transfer of Care Note  Patient: Debra [name]  Procedure(s) Performed: COLON RESECTION SIGMOID, open, [name], PA-C to assist (Abdomen) HERNIA REPAIR VENTRAL ADULT, open, [name], PA-C to assist (Abdomen) APPENDECTOMY (Abdomen) INSERTION OF MESH (Abdomen) REPAIR ABDOMINAL WALL (Abdomen)  Patient Location: PACU  Anesthesia Type:General  Level of Consciousness: drowsy  Airway & Oxygen Therapy: Patient connected to face mask oxygen  Post-op Assessment: Report given to RN  Post vital signs: Reviewed stable  Last Vitals:  Vitals Value Taken Time  BP    Temp    Pulse 77 02/13/22 1404  Resp 14 02/13/22 1404  SpO2 99 % 02/13/22 1404  Vitals shown include unvalidated device data.  Last Pain:  Vitals:   02/13/22 0998  TempSrc: Tympanic  PainSc: 0-No pain         Complications: No notable events documented.

## 2022-02-13 NOTE — Interval H&P Note (Signed)
History and Physical Interval Note:  02/13/2022 7:21 AM  Debra [name]  has presented today for surgery, with the diagnosis of Colon cancer.  The various methods of treatment have been discussed with the patient and family. After consideration of risks, benefits and other options for treatment, the patient has consented to  Procedure(s): COLON RESECTION SIGMOID, open, [name], PA-C to assist (N/A) [place], open, [name], PA-C to assist (N/A) as a surgical intervention.  The patient's history has been reviewed, patient examined, no change in status, stable for surgery.  I have reviewed the patient's chart and labs.  Questions were answered to the patient's satisfaction.     [place]

## 2022-02-13 NOTE — Op Note (Addendum)
PROCEDURES: Exicison of Pelvic mass from Greater omentum 2. Abdominal wall reconstruction with bilateral myocutaneous flaps using a [name] and two pieces of Phasix Mesh 20 x 25cm and additional  15x20cm in a diamond and home plate configuration 3. Recurrent Incisional hernia repair 4. Appendectomy 5. Wound vac Placement 34 x 2 cm ( Prevena plus)   Pre-operative Diagnosis: recurrent symptomatic ventral ventral hernia > 10 cms recurrent sigmoid cancer within Pelvic mass  Post-operative Diagnosis: Same   Surgeon: [place]   Anesthesia: General endotracheal anesthesia  ASA Class: 3  Surgeon: Caroleen Hamman , MD FACS  Anesthesia: Gen. with endotracheal tube  Assistant: Otho Ket PA-C( essential due to the complexity of the procedure and to have adequate exposure, he was needed to perform bilateral flaps by appropriately retracting the abdominal wall, He was also essential in assisting with Lysis of adhesion due to the complex nature of the procedure and two experience operators were absolutely required.)  Findings: 4 cm mass anterior to the sigmoid colon next to the bladder and seemed to be coming from the omentum No other evidence of distant metastatic disease Large ventral hernia > 10cms Great coverage of defect with reconstruction of  her abd wall  Diffuse oozing from thrombocytopenia and coagulopathy Very complex case due to hostile abdomen with extesnive adhesions Estimated Blood Loss: 1000 cc         Drains: 19 FR x 1 retro rectus space               Complications: none        Condition: stable  Procedure Details  The patient was seen again in the Holding Room. The benefits, complications, treatment options, and expected outcomes were discussed with the patient. The risks of bleeding, infection, recurrence of symptoms, failure to resolve symptoms,  bowel injury, any of which could require further surgery were reviewed with the patient.   The patient was  taken to Operating Room, identified and the procedure verified.  A Time Out was held and the above information confirmed. She did have a hx of sigmoid CA requiring adjuvant chemo new PET CT w concerning findings of recurrence, negative colonoscopy but rising CEA. IR unable to do biopsy due to location, mobility and thrombocytopenia. Generous elliptical incision was created from pubis to xiphoid incorporating the old scar and umbilicus The subcutaneous tissue is divided and the fascia and the hernia sac are identified. The peritoneal cavity was entered.  Extensive adhesions were performed using a combination of electrocautery and Metzenbaum scissors.  I spent over 2 1/2 hrs of total operative lysing adhesions and restoring the normal GI anatomy. There was significant adhesions of the small bowel to the abdominal wall and the omentum to the abdominal wall.   We identified the pelvic mass , it was coming from the greater omentum but was separate from the sigmoid colon and the bladder. I felt I was able to do R0 resection without the need for bladder or colon resection.  WE resected the mass with Liga\sure device.  No evidence of recurrent disease within large or small bowel. No evidence of peritoneal implants.  I Decided to perform an appendectomy given the extensive surgery and some chronic adhesive changs within the cecum and appendix..  The mesoappendix was divided between clamps and suture ligated and the appendix was divided with a standard 55 GIA stapler. Pelvic organs w/o evidence of primary neoplasm. Once we had good lysis of adhesions we entered the retrorectus space by  identifying/ incising the posterior sheath. This dissection is then performed laterally to the linea semilunaris, the most lateral aspect of the retrorectus space, using a combination of both blunt and sharp dissection   The retromuscular space is then bluntly developed to as far as the lateral border of both rectus muscles. We were  able to release the posterior sheath from the rectus by incising the fasciomuscular junction bilaterally and from cranial to cephalad until we have good mobility of the posterior rectus sheath. The dissection is repeated on the opposite site and  Was carried superiorly to the central tendon of the diaphragm, using a subxiphoid and retrosternal dissection. Dissection also  was developed inferiorly to the retropubic space.  This was truly a myofascial flap bilaterally taking at least 2 1/2 hrs of total operative time. Good hemostasis was observed in both intra-abominal cavity and retrorectus planes. Next, the posterior rectus sheaths are approximated to one another with 0 PDS suture.  Two pieces of Phasix mesh were placed, the larger one inferiorly in a diamond configuration and the smaller one in a home plate configuration cranially. Vistal seal was used to further secured the meshes. 19 blake drain placed within the retrorectus space and secure to the abdominal wall. Using liposomal Marcaine TAP Block was performed on each side under direct visualization.  The mesh lay very flat on in a corset fashion.  I was very happy with the repair.   Attention Was turned to the anterior sheath.  This was approximated with multiple running 0 PDS sutures.  We made sure that we released some of the tension that was attached to the subcutaneous tissue. The wound was irrigated with saline and sterile water.  It was approximated with a staples and a Prevena plus wound VAC was placed in the standard fashion. Please note that Mr. Freda Jackson  was needed to perform bilateral flaps by appropriately retracting the abdominal wall and assisting in the creation of the flaps,  He was also essential in assisting with Lysis of adhesion due to the complex nature of the procedure and two experience operators were absolutely required. Needle and laparotomy count were correct and there were no immediate complications  Caroleen Hamman, MD,  FACS

## 2022-02-14 ENCOUNTER — Encounter: Payer: Self-pay | Admitting: Surgery

## 2022-02-14 LAB — BPAM FFP
Blood Product Expiration Date: 202306132359
Blood Product Expiration Date: 202306132359
ISSUE DATE / TIME: 202306081208
ISSUE DATE / TIME: 202306081208
Unit Type and Rh: 6200
Unit Type and Rh: 9500

## 2022-02-14 LAB — COMPREHENSIVE METABOLIC PANEL
ALT: 22 U/L (ref 0–44)
AST: 30 U/L (ref 15–41)
Albumin: 3.3 g/dL — ABNORMAL LOW (ref 3.5–5.0)
Alkaline Phosphatase: 63 U/L (ref 38–126)
Anion gap: 5 (ref 5–15)
BUN: 11 mg/dL (ref 6–20)
CO2: 23 mmol/L (ref 22–32)
Calcium: 8.2 mg/dL — ABNORMAL LOW (ref 8.9–10.3)
Chloride: 110 mmol/L (ref 98–111)
Creatinine, Ser: 0.42 mg/dL — ABNORMAL LOW (ref 0.44–1.00)
GFR, Estimated: >60 mL/min (ref >60)
Glucose, Bld: 137 mg/dL — ABNORMAL HIGH (ref 70–99)
Potassium: 3.6 mmol/L (ref 3.5–5.1)
Sodium: 138 mmol/L (ref 135–145)
Total Bilirubin: 1 mg/dL (ref 0.3–1.2)
Total Protein: 6.1 g/dL — ABNORMAL LOW (ref 6.5–8.1)

## 2022-02-14 LAB — PREPARE PLATELET PHERESIS: Unit division: 0

## 2022-02-14 LAB — BPAM PLATELET PHERESIS
Blood Product Expiration Date: 202306082359
ISSUE DATE / TIME: 202306080818
Unit Type and Rh: 6200

## 2022-02-14 LAB — PREPARE FRESH FROZEN PLASMA: Unit division: 0

## 2022-02-14 LAB — CBC
HCT: 25.8 % — ABNORMAL LOW (ref 36.0–46.0)
Hemoglobin: 9 g/dL — ABNORMAL LOW (ref 12.0–15.0)
MCH: 29.9 pg (ref 26.0–34.0)
MCHC: 34.9 g/dL (ref 30.0–36.0)
MCV: 85.7 fL (ref 80.0–100.0)
Platelets: 98 10*3/uL — ABNORMAL LOW (ref 150–400)
RBC: 3.01 MIL/uL — ABNORMAL LOW (ref 3.87–5.11)
RDW: 14 % (ref 11.5–15.5)
WBC: 4.1 10*3/uL (ref 4.0–10.5)
nRBC: 0 % (ref 0.0–0.2)

## 2022-02-14 LAB — PROTIME-INR
INR: 1.4 — ABNORMAL HIGH (ref 0.8–1.2)
Prothrombin Time: 16.7 seconds — ABNORMAL HIGH (ref 11.4–15.2)

## 2022-02-14 LAB — HIV ANTIBODY (ROUTINE TESTING W REFLEX): HIV Screen 4th Generation wRfx: NONREACTIVE

## 2022-02-14 MED ORDER — CHLORHEXIDINE GLUCONATE CLOTH 2 % EX PADS
6.0000 | MEDICATED_PAD | Freq: Every day | CUTANEOUS | Status: DC
  Administered 2022-02-14 – 2022-12-18 (×298): 6 via TOPICAL

## 2022-02-14 MED ORDER — ALBUMIN HUMAN 25 % IV SOLN
25.0000 g | Freq: Once | INTRAVENOUS | Status: AC
  Administered 2022-02-14: 25 g via INTRAVENOUS
  Filled 2022-02-14: qty 100

## 2022-02-14 MED ORDER — SODIUM CHLORIDE 0.9 % IV BOLUS
1000.0000 mL | Freq: Once | INTRAVENOUS | Status: AC
  Administered 2022-02-14: 1000 mL via INTRAVENOUS

## 2022-02-14 NOTE — Progress Notes (Addendum)
[facility] Day(s): 1.   Post op day(s): 1 Day Post-Op.   Interval History:  Patient seen and examined No acute events or new complaints overnight.  Patient reports she is doing well; did not have any pain until about 0400 last night No fever, chills, nausea, emesis She is without leukocytosis; WBC 4.1K Hgb to 9.0; relatively stable compared to intra-operative labs INR 1.4 Renal function normal; sCr - 0.42; UO - 2650 ccs No significant electrolyte derangements Surgical drain with 425 ccs out; serosanguinous She is on CLD; tolerating  Vital signs in last 24 hours: [min-max] current  Temp:  [97 F (36.1 C)-98.2 F (36.8 C)] 98.2 F (36.8 C) (06/09 0427) Pulse Rate:  [64-77] 67 (06/09 0427) Resp:  [13-20] 16 (06/09 0427) BP: (83-116)/(50-64) 92/61 (06/09 0427) SpO2:  [93 %-100 %] 98 % (06/09 0427)     Height: '4\' 11"'$  (149.9 cm) Weight: 89.4 kg BMI (Calculated): 39.77   Intake/Output last 2 shifts:  06/08 0701 - 06/09 0700 In: 5104.3 [I.V.:3496.3; Blood:1058; IV Piggyback:550] Out: 2297 [Urine:2650; Drains:425; Blood:1000]   Physical Exam:  Constitutional: alert, cooperative and no distress  Respiratory: breathing non-labored at rest  Cardiovascular: regular rate and sinus rhythm  Gastrointestinal: Soft, incisional soreness, non-distended, no rebound/guarding. Surgical drain in LLQ; output serosanguinous Integumentary: Laparotomy with Prevena in place; good seal   Labs:     Latest Ref Rng & Units 02/13/2022   10:42 AM 02/13/2022    7:03 AM 02/06/2022   10:34 AM  CBC  WBC 4.0 - 10.5 K/uL 8.2  3.8  3.6   Hemoglobin 12.0 - 15.0 g/dL 9.8  12.7  12.8   Hematocrit 36.0 - 46.0 % 28.5  37.6  38.1   Platelets 150 - 400 K/uL 72  62  75       Latest Ref Rng & Units 12/25/2021   12:46 PM 09/26/2021    1:41 PM 06/20/2021   11:01 AM  CMP  Glucose 70 - 99 mg/dL 121  119  104   BUN 6 - 20 mg/dL '13  9  10   '$ Creatinine 0.44 - 1.00 mg/dL  0.45  0.42  0.40   Sodium 135 - 145 mmol/L 135  135  138   Potassium 3.5 - 5.1 mmol/L 4.0  3.6  4.2   Chloride 98 - 111 mmol/L 105  103  106   CO2 22 - 32 mmol/L '25  26  28   '$ Calcium 8.9 - 10.3 mg/dL 8.9  8.8  9.1   Total Protein 6.5 - 8.1 g/dL 7.3  7.3  7.8   Total Bilirubin 0.3 - 1.2 mg/dL 0.7  0.6  0.7   Alkaline Phos 38 - 126 U/L 115  125  136   AST 15 - 41 U/L 36  31  38   ALT 0 - 44 U/L '23  18  23      '$ Imaging studies: No new pertinent imaging studies   Assessment/Plan:  56 y.o. female  1 Day Post-Op s/p laparotomy, excision of greater omental mass, abdominal wall reconstruction with [name],appendectomy, and placement of Prevena vac.   - Continue FLD for now; may advance with ROBF  - Continue IVF support  - Complete perioperative Abx  - Discontinue foley catheter today    - Monitor abdominal examination; on-going bowel function - Pain control prn; antiemetics prn - Morning labs - Mobilization as tolerated; engage PT   All of the above  findings and recommendations were discussed with the patient, patient's family (daughter at bedside), and the medical team, and all of patient's and family's questions were answered to their expressed satisfaction.  -- [name], PA-C [facility] 02/14/2022, 7:24 AM M-F: 7am - 4pm

## 2022-02-15 ENCOUNTER — Inpatient Hospital Stay: Payer: Self-pay

## 2022-02-15 ENCOUNTER — Encounter: Admission: RE | Disposition: E | Payer: Self-pay | Source: Home / Self Care | Attending: Surgery

## 2022-02-15 ENCOUNTER — Inpatient Hospital Stay: Payer: Medicaid Other

## 2022-02-15 ENCOUNTER — Other Ambulatory Visit: Payer: Self-pay

## 2022-02-15 ENCOUNTER — Inpatient Hospital Stay: Payer: Medicaid Other | Admitting: Anesthesiology

## 2022-02-15 DIAGNOSIS — K631 Perforation of intestine (nontraumatic): Secondary | ICD-10-CM

## 2022-02-15 HISTORY — PX: INSERTION OF MESH: SHX5868

## 2022-02-15 HISTORY — PX: LAPAROTOMY: SHX154

## 2022-02-15 HISTORY — PX: APPLICATION OF WOUND VAC: SHX5189

## 2022-02-15 LAB — CBC
HCT: 24.1 % — ABNORMAL LOW (ref 36.0–46.0)
Hemoglobin: 8 g/dL — ABNORMAL LOW (ref 12.0–15.0)
MCH: 28.7 pg (ref 26.0–34.0)
MCHC: 33.2 g/dL (ref 30.0–36.0)
MCV: 86.4 fL (ref 80.0–100.0)
Platelets: 59 10*3/uL — ABNORMAL LOW (ref 150–400)
RBC: 2.79 MIL/uL — ABNORMAL LOW (ref 3.87–5.11)
RDW: 14.3 % (ref 11.5–15.5)
WBC: 3.4 10*3/uL — ABNORMAL LOW (ref 4.0–10.5)
nRBC: 0 % (ref 0.0–0.2)

## 2022-02-15 LAB — PROTIME-INR
INR: 1.5 — ABNORMAL HIGH (ref 0.8–1.2)
Prothrombin Time: 18.3 seconds — ABNORMAL HIGH (ref 11.4–15.2)

## 2022-02-15 LAB — COMPREHENSIVE METABOLIC PANEL
ALT: 15 U/L (ref 0–44)
AST: 20 U/L (ref 15–41)
Albumin: 3.1 g/dL — ABNORMAL LOW (ref 3.5–5.0)
Alkaline Phosphatase: 48 U/L (ref 38–126)
Anion gap: 6 (ref 5–15)
BUN: 22 mg/dL — ABNORMAL HIGH (ref 6–20)
CO2: 22 mmol/L (ref 22–32)
Calcium: 7.8 mg/dL — ABNORMAL LOW (ref 8.9–10.3)
Chloride: 104 mmol/L (ref 98–111)
Creatinine, Ser: 1.29 mg/dL — ABNORMAL HIGH (ref 0.44–1.00)
GFR, Estimated: 48 mL/min — ABNORMAL LOW (ref >60)
Glucose, Bld: 121 mg/dL — ABNORMAL HIGH (ref 70–99)
Potassium: 4 mmol/L (ref 3.5–5.1)
Sodium: 132 mmol/L — ABNORMAL LOW (ref 135–145)
Total Bilirubin: 0.9 mg/dL (ref 0.3–1.2)
Total Protein: 5.3 g/dL — ABNORMAL LOW (ref 6.5–8.1)

## 2022-02-15 LAB — PREPARE RBC (CROSSMATCH)

## 2022-02-15 LAB — PHOSPHORUS: Phosphorus: 2.4 mg/dL — ABNORMAL LOW (ref 2.5–4.6)

## 2022-02-15 LAB — MAGNESIUM: Magnesium: 1.5 mg/dL — ABNORMAL LOW (ref 1.7–2.4)

## 2022-02-15 SURGERY — LAPAROTOMY, EXPLORATORY
Anesthesia: General | Site: Abdomen

## 2022-02-15 MED ORDER — CALCIUM CHLORIDE 10 % IV SOLN
INTRAVENOUS | Status: AC
  Filled 2022-02-15: qty 10

## 2022-02-15 MED ORDER — MIDAZOLAM HCL 2 MG/2ML IJ SOLN
INTRAMUSCULAR | Status: DC | PRN
  Administered 2022-02-15: 2 mg via INTRAVENOUS

## 2022-02-15 MED ORDER — PROPOFOL 10 MG/ML IV BOLUS
INTRAVENOUS | Status: AC
  Filled 2022-02-15: qty 20

## 2022-02-15 MED ORDER — PROPOFOL 10 MG/ML IV BOLUS
INTRAVENOUS | Status: AC
  Filled 2022-02-15: qty 40

## 2022-02-15 MED ORDER — LIDOCAINE HCL (PF) 2 % IJ SOLN
INTRAMUSCULAR | Status: AC
  Filled 2022-02-15: qty 10

## 2022-02-15 MED ORDER — MIDAZOLAM HCL 2 MG/2ML IJ SOLN
INTRAMUSCULAR | Status: AC
  Filled 2022-02-15: qty 2

## 2022-02-15 MED ORDER — MAGNESIUM SULFATE 2 GM/50ML IV SOLN: 2.0000 g | Freq: Once | INTRAVENOUS | Status: DC

## 2022-02-15 MED ORDER — SODIUM CHLORIDE 0.9% FLUSH
10.0000 mL | Freq: Two times a day (BID) | INTRAVENOUS | Status: DC
  Administered 2022-02-16 – 2022-03-30 (×66): 10 mL

## 2022-02-15 MED ORDER — ROCURONIUM BROMIDE 100 MG/10ML IV SOLN
INTRAVENOUS | Status: DC | PRN
  Administered 2022-02-15: 20 mg via INTRAVENOUS
  Administered 2022-02-15: 50 mg via INTRAVENOUS

## 2022-02-15 MED ORDER — INSULIN ASPART 100 UNIT/ML IJ SOLN
0.0000 [IU] | Freq: Four times a day (QID) | INTRAMUSCULAR | Status: DC
  Administered 2022-02-16: 2 [IU] via SUBCUTANEOUS
  Administered 2022-02-16: 1 [IU] via SUBCUTANEOUS
  Administered 2022-02-16 (×3): 2 [IU] via SUBCUTANEOUS
  Administered 2022-02-17: 3 [IU] via SUBCUTANEOUS
  Administered 2022-02-17: 5 [IU] via SUBCUTANEOUS
  Administered 2022-02-17: 2 [IU] via SUBCUTANEOUS
  Administered 2022-02-18 (×2): 5 [IU] via SUBCUTANEOUS
  Administered 2022-02-18: 7 [IU] via SUBCUTANEOUS
  Administered 2022-02-18: 5 [IU] via SUBCUTANEOUS
  Administered 2022-02-19 (×4): 7 [IU] via SUBCUTANEOUS
  Administered 2022-02-20: 5 [IU] via SUBCUTANEOUS
  Administered 2022-02-20: 3 [IU] via SUBCUTANEOUS
  Administered 2022-02-20 (×3): 5 [IU] via SUBCUTANEOUS
  Administered 2022-02-21: 3 [IU] via SUBCUTANEOUS
  Filled 2022-02-15 (×22): qty 1

## 2022-02-15 MED ORDER — SODIUM CHLORIDE 0.9 % IV SOLN: INTRAVENOUS | Status: AC

## 2022-02-15 MED ORDER — LIDOCAINE HCL (CARDIAC) PF 100 MG/5ML IV SOSY
PREFILLED_SYRINGE | INTRAVENOUS | Status: DC | PRN
  Administered 2022-02-15: 100 mg via INTRAVENOUS

## 2022-02-15 MED ORDER — PHENYLEPHRINE HCL-NACL 20-0.9 MG/250ML-% IV SOLN
INTRAVENOUS | Status: DC | PRN
  Administered 2022-02-15: 60 ug/min via INTRAVENOUS

## 2022-02-15 MED ORDER — TRACE MINERALS CU-MN-SE-ZN 300-55-60-3000 MCG/ML IV SOLN
INTRAVENOUS | Status: AC
  Filled 2022-02-15: qty 333.2

## 2022-02-15 MED ORDER — SODIUM CHLORIDE 0.9% IV SOLUTION: Freq: Once | INTRAVENOUS | Status: AC

## 2022-02-15 MED ORDER — 0.9 % SODIUM CHLORIDE (POUR BTL) OPTIME
TOPICAL | Status: DC | PRN
  Administered 2022-02-15 (×3): 1000 mL
  Administered 2022-02-15 (×2): 500 mL
  Administered 2022-02-15 (×7): 1000 mL

## 2022-02-15 MED ORDER — CALCIUM CHLORIDE 10 % IV SOLN
INTRAVENOUS | Status: DC | PRN
  Administered 2022-02-15 (×2): .5 g via INTRAVENOUS

## 2022-02-15 MED ORDER — FENTANYL CITRATE (PF) 250 MCG/5ML IJ SOLN
INTRAMUSCULAR | Status: AC
  Filled 2022-02-15: qty 5

## 2022-02-15 MED ORDER — ACETAMINOPHEN 10 MG/ML IV SOLN: 1000.0000 mg | Freq: Once | INTRAVENOUS | Status: DC | PRN

## 2022-02-15 MED ORDER — ALBUMIN HUMAN 25 % IV SOLN
25.0000 g | Freq: Once | INTRAVENOUS | Status: AC
  Administered 2022-02-15: 25 g via INTRAVENOUS
  Filled 2022-02-15: qty 100

## 2022-02-15 MED ORDER — HYDROMORPHONE HCL 1 MG/ML IJ SOLN: 0.2500 mg | INTRAMUSCULAR | Status: DC | PRN

## 2022-02-15 MED ORDER — PHENOL 1.4 % MT LIQD
1.0000 | OROMUCOSAL | Status: DC | PRN
  Filled 2022-02-15 (×2): qty 177

## 2022-02-15 MED ORDER — ONDANSETRON HCL 4 MG/2ML IJ SOLN
INTRAMUSCULAR | Status: AC
  Filled 2022-02-15: qty 2

## 2022-02-15 MED ORDER — TRACE MINERALS CU-MN-SE-ZN 300-55-60-3000 MCG/ML IV SOLN: INTRAVENOUS | Status: DC

## 2022-02-15 MED ORDER — PIPERACILLIN-TAZOBACTAM 3.375 G IVPB
INTRAVENOUS | Status: AC
  Filled 2022-02-15: qty 50

## 2022-02-15 MED ORDER — SUCCINYLCHOLINE CHLORIDE 200 MG/10ML IV SOSY
PREFILLED_SYRINGE | INTRAVENOUS | Status: DC | PRN
  Administered 2022-02-15: 100 mg via INTRAVENOUS

## 2022-02-15 MED ORDER — PIPERACILLIN-TAZOBACTAM 3.375 G IVPB
3.3750 g | Freq: Three times a day (TID) | INTRAVENOUS | Status: DC
  Administered 2022-02-15 – 2022-02-21 (×19): 3.375 g via INTRAVENOUS
  Filled 2022-02-15 (×18): qty 50

## 2022-02-15 MED ORDER — PHENYLEPHRINE HCL (PRESSORS) 10 MG/ML IV SOLN
INTRAVENOUS | Status: DC | PRN
  Administered 2022-02-15 (×3): 160 ug via INTRAVENOUS

## 2022-02-15 MED ORDER — FENTANYL CITRATE (PF) 100 MCG/2ML IJ SOLN
INTRAMUSCULAR | Status: DC | PRN
Start: 2022-02-15 — End: 2022-02-15
  Administered 2022-02-15: 150 ug via INTRAVENOUS
  Administered 2022-02-15: 50 ug via INTRAVENOUS

## 2022-02-15 MED ORDER — PHENYLEPHRINE HCL-NACL 20-0.9 MG/250ML-% IV SOLN
INTRAVENOUS | Status: AC
Start: 2022-02-15 — End: ?
  Filled 2022-02-15: qty 250

## 2022-02-15 MED ORDER — PROMETHAZINE HCL 25 MG/ML IJ SOLN: 6.2500 mg | INTRAMUSCULAR | Status: DC | PRN

## 2022-02-15 MED ORDER — ROCURONIUM BROMIDE 10 MG/ML (PF) SYRINGE
PREFILLED_SYRINGE | INTRAVENOUS | Status: AC
  Filled 2022-02-15: qty 10

## 2022-02-15 MED ORDER — ONDANSETRON HCL 4 MG/2ML IJ SOLN
INTRAMUSCULAR | Status: DC | PRN
  Administered 2022-02-15: 4 mg via INTRAVENOUS

## 2022-02-15 MED ORDER — VISTASEAL 10 ML SINGLE DOSE KIT
PACK | CUTANEOUS | Status: DC | PRN
  Administered 2022-02-15 (×2): 4 mL via TOPICAL
  Administered 2022-02-15: 10 mL via TOPICAL

## 2022-02-15 MED ORDER — SODIUM CHLORIDE 0.9% FLUSH: 10.0000 mL | INTRAVENOUS | Status: DC | PRN

## 2022-02-15 MED ORDER — ACETAMINOPHEN 10 MG/ML IV SOLN
INTRAVENOUS | Status: AC
  Filled 2022-02-15: qty 100

## 2022-02-15 MED ORDER — PROPOFOL 10 MG/ML IV BOLUS
INTRAVENOUS | Status: DC | PRN
  Administered 2022-02-15: 80 mg via INTRAVENOUS

## 2022-02-15 MED ORDER — DROPERIDOL 2.5 MG/ML IJ SOLN: 0.6250 mg | Freq: Once | INTRAMUSCULAR | Status: DC | PRN

## 2022-02-15 MED ORDER — SODIUM CHLORIDE 0.9 % IV SOLN: INTRAVENOUS | Status: DC | PRN

## 2022-02-15 MED ORDER — ACETAMINOPHEN 10 MG/ML IV SOLN
INTRAVENOUS | Status: DC | PRN
  Administered 2022-02-15: 1000 mg via INTRAVENOUS

## 2022-02-15 MED ORDER — LIDOCAINE HCL (PF) 2 % IJ SOLN
INTRAMUSCULAR | Status: AC
  Filled 2022-02-15: qty 5

## 2022-02-15 MED ORDER — SUGAMMADEX SODIUM 200 MG/2ML IV SOLN
INTRAVENOUS | Status: DC | PRN
  Administered 2022-02-15: 200 mg via INTRAVENOUS

## 2022-02-15 SURGICAL SUPPLY — 50 items
BARRIER ADH SEPRAFILM 3INX5IN (MISCELLANEOUS) IMPLANT
BLADE SURG SZ10 CARB STEEL (BLADE) ×2 IMPLANT
BRR ADH 5X3 SEPRAFILM 2 SHT (MISCELLANEOUS)
BULB RESERV EVAC DRAIN JP 100C (MISCELLANEOUS) ×2 IMPLANT
CANISTER WOUND CARE 500ML ATS (WOUND CARE) ×1 IMPLANT
COVER BACK TABLE REUSABLE LG (DRAPES) ×2 IMPLANT
DRAIN CHANNEL 19F RND (DRAIN) ×2 IMPLANT
DRAPE LAPAROTOMY 100X77 ABD (DRAPES) ×2 IMPLANT
DRSG OPSITE POSTOP 4X10 (GAUZE/BANDAGES/DRESSINGS) IMPLANT
DRSG OPSITE POSTOP 4X12 (GAUZE/BANDAGES/DRESSINGS) IMPLANT
DRSG TEGADERM 4X4.75 (GAUZE/BANDAGES/DRESSINGS) ×2 IMPLANT
DRSG VAC ATS MED SENSATRAC (GAUZE/BANDAGES/DRESSINGS) ×1 IMPLANT
ELECT BLADE 6.5 EXT (BLADE) ×2 IMPLANT
ELECT REM PT RETURN 9FT ADLT (ELECTROSURGICAL) ×2
ELECTRODE REM PT RTRN 9FT ADLT (ELECTROSURGICAL) ×1 IMPLANT
GAUZE 4X4 16PLY ~~LOC~~+RFID DBL (SPONGE) ×2 IMPLANT
GLOVE BIO SURGEON STRL SZ7 (GLOVE) ×2 IMPLANT
GOWN STRL REUS W/ TWL LRG LVL3 (GOWN DISPOSABLE) ×2 IMPLANT
GOWN STRL REUS W/TWL LRG LVL3 (GOWN DISPOSABLE) ×6
HANDLE SUCTION POOLE (INSTRUMENTS) ×1 IMPLANT
HANDLE YANKAUER SUCT BULB TIP (MISCELLANEOUS) ×2 IMPLANT
MANIFOLD NEPTUNE II (INSTRUMENTS) ×2 IMPLANT
MESH PHASIX RESORB RECT 25X30 (Mesh General) ×1 IMPLANT
MESH PHASIX ST 20CMX25CM (Tissue Mesh) ×1 IMPLANT
NEEDLE HYPO 22GX1.5 SAFETY (NEEDLE) ×4 IMPLANT
NS IRRIG 1000ML POUR BTL (IV SOLUTION) ×10 IMPLANT
NS IRRIG 500ML POUR BTL (IV SOLUTION) ×2 IMPLANT
PACK BASIN MAJOR ARMC (MISCELLANEOUS) ×2 IMPLANT
PENCIL ELECTRO HAND CTR (MISCELLANEOUS) ×1 IMPLANT
SOL PREP PVP 2OZ (MISCELLANEOUS) ×6
SOLUTION PREP PVP 2OZ (MISCELLANEOUS) IMPLANT
SPONGE DRAIN TRACH 4X4 STRL 2S (GAUZE/BANDAGES/DRESSINGS) ×2 IMPLANT
SPONGE T-LAP 18X18 ~~LOC~~+RFID (SPONGE) ×11 IMPLANT
SPONGE T-LAP 18X36 ~~LOC~~+RFID STR (SPONGE) IMPLANT
SUCTION POOLE HANDLE (INSTRUMENTS) ×2
SUT ETHILON 3-0 FS-10 30 BLK (SUTURE) ×4
SUT PDS AB 0 CT1 27 (SUTURE) ×13 IMPLANT
SUT SILK 2 0 (SUTURE) ×2
SUT SILK 2 0 SH CR/8 (SUTURE) ×3 IMPLANT
SUT SILK 2 0SH CR/8 30 (SUTURE) ×2 IMPLANT
SUT SILK 2-0 18XBRD TIE 12 (SUTURE) ×1 IMPLANT
SUT VIC AB 0 CT1 36 (SUTURE) ×4 IMPLANT
SUT VIC AB 2-0 SH 27 (SUTURE) ×20
SUT VIC AB 2-0 SH 27XBRD (SUTURE) ×2 IMPLANT
SUT VIC AB 3-0 SH 27 (SUTURE) ×2
SUT VIC AB 3-0 SH 27X BRD (SUTURE) ×1 IMPLANT
SUTURE EHLN 3-0 FS-10 30 BLK (SUTURE) IMPLANT
SYR 30ML LL (SYRINGE) ×4 IMPLANT
SYR 3ML LL SCALE MARK (SYRINGE) ×2 IMPLANT
TRAY FOLEY MTR SLVR 16FR STAT (SET/KITS/TRAYS/PACK) ×2 IMPLANT

## 2022-02-15 NOTE — Anesthesia Preprocedure Evaluation (Addendum)
Anesthesia Evaluation  Patient identified by MRN, date of birth, ID band Patient awake    Reviewed: Allergy & Precautions, H&P , NPO status , Patient's Chart, lab work & pertinent test results, reviewed documented beta blocker date and time   Airway Mallampati: III  TM Distance: >3 FB Neck ROM: full    Dental  (+) Teeth Intact   Pulmonary neg pulmonary ROS,    Pulmonary exam normal        Cardiovascular Exercise Tolerance: Good negative cardio ROS Normal cardiovascular exam Rhythm:regular Rate:Normal     Neuro/Psych negative neurological ROS  negative psych ROS   GI/Hepatic negative GI ROS, Neg liver ROS,   Endo/Other  Hypothyroidism Morbid obesity  Renal/GU negative Renal ROS  negative genitourinary   Musculoskeletal   Abdominal (+) + obese,  Abdomen: tender.    Peds  Hematology  (+) Blood dyscrasia, anemia ,   Anesthesia Other Findings Past Medical History: No date: Anemia 04/2020: Colon cancer ([place]) No date: History of kidney stones No date: Hypothyroidism No date: Thyroid disease Past Surgical History: 11/21/2019: COLONOSCOPY WITH PROPOFOL; N/A     Comment:  Procedure: COLONOSCOPY WITH PROPOFOL;  Surgeon: Lin Landsman, MD;  Location: [facility] END[facility]COPY;  Service:               Gastroenterology;  Laterality: N/A; 07/10/2021: COLONOSCOPY WITH PROPOFOL; N/A     Comment:  Procedure: COLONOSCOPY WITH PROPOFOL;  Surgeon: Lin Landsman, MD;  Location: [facility] ENDOSCOPY;  Service:               Gastroenterology;  Laterality: N/A; 02/06/2022: COLONOSCOPY WITH PROPOFOL; N/A     Comment:  Procedure: COLONOSCOPY WITH PROPOFOL;  Surgeon: Lin Landsman, MD;  Location: [facility] ENDOSCOPY;  Service:               Gastroenterology;  Laterality: N/A;  SPANISH 01/30/2020: CYSTOSCOPY WITH STENT PLACEMENT; Left     Comment:  Procedure: CYSTOSCOPY WITH STENT PLACEMENT;   Surgeon:               Hollice Espy, MD;  Location: [facility] ORS;  Service:               Urology;  Laterality: Left; 11/21/2019: ESOPHAGOGASTRODUODENOSCOPY (EGD) WITH PROPOFOL; N/A     Comment:  Procedure: ESOPHAGOGASTRODUODENOSCOPY (EGD) WITH               PROPOFOL;  Surgeon: Lin Landsman, MD;  Location:               [facility] ENDOSCOPY;  Service: Gastroenterology;  Laterality:               N/A; 07/03/2020: IR RADIOLOGIST EVAL & MGMT 07/12/2020: IR RADIOLOGIST EVAL & MGMT 01/30/2020: PARTIAL COLECTOMY; N/A     Comment:  Procedure: PARTIAL COLECTOMY Sigmoid;  Surgeon: [name], MD;  Location: [facility] ORS;  Service: General;                Laterality: N/A; 03/28/2020: PORTACATH PLACEMENT; Left     Comment:  Procedure: INSERTION PORT-A-CATH;  Surgeon: [name], MD;  Location: [facility] ORS;  Service: General;                Laterality: Left; 08/29/2020: PORTACATH PLACEMENT; N/A     Comment:  Procedure: INSERTION PORT-A-CATH, chemotherapy;                Surgeon: [name], MD;  Location: [facility] ORS;                Service: General;  Laterality: N/A; BMI    Body Mass Index: 39.79 kg/m     Reproductive/Obstetrics negative OB ROS                             Anesthesia Physical  Anesthesia Plan  ASA: 3 and emergent  Anesthesia Plan: General ETT   Post-op Pain Management: Ofirmev IV (intra-op)* and Ketamine IV*   Induction: Intravenous and Rapid sequence  PONV Risk Score and Plan:   Airway Management Planned: Oral ETT  Additional Equipment:   Intra-op Plan:   Post-operative Plan: Extubation in OR  Informed Consent: I have reviewed the patients History and Physical, chart, labs and discussed the procedure including the risks, benefits and alternatives for the proposed anesthesia with the patient or authorized representative who has indicated his/her understanding and acceptance.     Dental Advisory  Given  Plan Discussed with: CRNA  Anesthesia Plan Comments:        Anesthesia Quick Evaluation

## 2022-02-15 NOTE — Op Note (Signed)
PROCEDURES: Reopening of recent laparotomy Repair of small bowel perforation Excision of Mesh Placement of Two Phasix mesh one Phasix ST 10x 25 cm as a bridge intraperitoneal and another New Mesh Phasix 25x30 cms retrorectus space 5. Wound vac Placement 34 x 2 cm ( Prevena plus)     Pre-operative Diagnosis: Suspect for bowel perforation   Post-operative Diagnosis: Same    Surgeon: Marjory Lies Malek Skog    Anesthesia: General endotracheal anesthesia   ASA Class: 3   Surgeon: Caroleen Hamman , MD FACS   Anesthesia: Gen. with endotracheal tube   Assistant: Waylan Boga RNFA   Findings: Small bowel perforation ileum with enteric content intra abdominal cavity Posterior sheath under tension in need for bridging mesh to cover defect.   Estimated Blood Loss: 50 cc         Drains: 19 FR x 1 retro rectus space ( right side)   and another 19 FR pelvis intraperitoneal    ( left side)       Complications: none        Condition: stable   Procedure Details  The patient was seen again in the Holding Room. The benefits, complications, treatment options, and expected outcomes were discussed with the patient. The risks of bleeding, infection, recurrence of symptoms, failure to resolve symptoms,  bowel injury, any of which could require further surgery were reviewed with the patient.   The patient was taken to Operating Room, identified and the procedure verified.  A Time Out was held and the above information confirmed.   Prevena vac removed and staples removed. We  Reprep with drain in place.  We open the laparotomy by removing the PDS on anterior and posterior sheaths.  We also removed the prior phasic's ST as there was enteric contents within the retrorectus space. We entered the abdominal cavity and there was evidence of enteric contents throughout it.  The bowel was run and we found the perforation within the ileum.  There was about a 1 cm perforation.  I repaired it in a 2 layer fashion with the  first layer with interrupted Vicryl stitch and the secondary with multiple Lembert sutures.  The repair was widely patent and without evidence of leaks. Abdominal cavity was washed out with several liters of normal saline.  Started to place a drain next to the bowel repair and within the pelvis with an exit on the right side. He approximated the posterior sheath using a bridge phasix ST mesh and we attached the phasix mesh with multiple interrupted PDS sutures circumferentially.  The retrorectus space was also irrigated with normal saline. I used a new piece of phasic's regular mesh and cover the retrorectus space very well including the retropubic space and retrosternal space. I applied Vistaseal within the retrorectus space and also placed a fresh JP drain on prior exit site.  Older drains were sutured to the abdominal wall with 3-0 nylon. Subcu tissue was reapproximated with multiple 2-0 Vicryl's and because of the Solus I decided not to close the skin completely and a regular gray sponge wound VAC was placed within the subcutaneous tissue.  There were adequate suction without any leaks. . Needle and laparotomy count were correct and there were no immediate complications   Caroleen Hamman, MD, FACS

## 2022-02-15 NOTE — Progress Notes (Signed)
PHARMACY - TOTAL PARENTERAL NUTRITION CONSULT NOTE   Indication: Prolonged ileus  Patient Measurements: Height: '4\' 11"'$  (149.9 cm) Weight: 89.4 kg (197 lb) IBW/kg (Calculated) : 43.2 TPN AdjBW (KG): 54.7 Body mass index is 39.79 kg/m.  Assessment:  56 y.o. female  s/p laparotomy, excision of greater omental mass, abdominal wall reconstruction with [name],appendectomy, and placement of Prevena vac.  Glucose / Insulin: no insulin requirements prior to TPN Electrolytes: hypomagnesemia, hypophosphatemia Renal: Scr 0.42--->1.29 Hepatic: LFTs wnl Intake / Output; MIVF: 0.9 %  sodium chloride infusion at 50 mL/hr GI Imaging: GI Surgeries / Procedures: s/p laparotomy, excision of greater omental mass, abdominal wall reconstruction with [name],appendectomy, and placement of Prevena vac  Central access: 02/15/22 TPN start date: 02/15/22  Nutritional Goals: Goal TPN rate is 70 mL/hr (provides 100 g of protein and 1795 kcals per day)  RD Assessment: pending    Current Nutrition:  NPO  Plan:  Start TPN at 45m/hr at 1800 Electrolytes in TPN (standard): Na 520m/L, K 50106mL, Ca 5mE45m, Mg 5mEq25m and Phos 15mmo33m Cl:Ac 1:1 Add standard MVI and trace elements to TPN Initiate Sensitive q6h SSI and adjust as needed  continue MIVF at 50 mL/hr  2 grams IV magnesium sulfate x 1 Monitor TPN labs on Mon/Thurs, daily until stable  RodneyDallie Piles2023,11:27 AM

## 2022-02-15 NOTE — Anesthesia Postprocedure Evaluation (Signed)
Anesthesia Post Note  Patient: Debra [name]  Procedure(s) Performed: EXPLORATORY LAPAROTOMY REPAIR OF BOWEL PERFORATION (Abdomen) INSERTION OF MESH (Abdomen) APPLICATION OF WOUND VAC (Abdomen)  Patient location during evaluation: PACU Anesthesia Type: General Level of consciousness: awake and alert Pain management: pain level controlled Vital Signs Assessment: post-procedure vital signs reviewed and stable Respiratory status: spontaneous breathing, nonlabored ventilation and respiratory function stable Cardiovascular status: blood pressure returned to baseline and stable Postop Assessment: no apparent nausea or vomiting Anesthetic complications: no   No notable events documented.   Last Vitals:  Vitals:   02/15/22 1545 02/15/22 1741  BP: (!) 94/59 (!) 87/55  Pulse: (!) 111 (!) 117  Resp: 18 18  Temp: 36.9 C 37.3 C  SpO2: 92% (!) 89%    Last Pain:  Vitals:   02/15/22 1741  TempSrc: Oral  PainSc:                  [place] Ouch

## 2022-02-15 NOTE — Transfer of Care (Signed)
Immediate Anesthesia Transfer of Care Note  Patient: Debra [name]  Procedure(s) Performed: EXPLORATORY LAPAROTOMY REPAIR OF BOWEL PERFORATION (Abdomen) INSERTION OF MESH (Abdomen) APPLICATION OF WOUND VAC (Abdomen)  Patient Location: PACU  Anesthesia Type:General  Level of Consciousness: drowsy  Airway & Oxygen Therapy: Patient Spontanous Breathing and Patient connected to face mask oxygen  Post-op Assessment: Report given to RN and Post -op Vital signs reviewed and stable  Post vital signs: Reviewed and stable  Last Vitals:  Vitals Value Taken Time  BP 107/58 02/15/22 1030  Temp 36.9 C 02/15/22 1030  Pulse 93 02/15/22 1034  Resp 22 02/15/22 1034  SpO2 96 % 02/15/22 1034  Vitals shown include unvalidated device data.  Last Pain:  Vitals:   02/15/22 1030  TempSrc:   PainSc: Asleep         Complications: No notable events documented.

## 2022-02-15 NOTE — Anesthesia Procedure Notes (Signed)
Procedure Name: Intubation Date/Time: 02/15/2022 7:44 AM  Performed by: Esaw Grandchild, CRNAPre-anesthesia Checklist: Patient identified, Emergency Drugs available, Suction available and Patient being monitored Patient Re-evaluated:Patient Re-evaluated prior to induction Oxygen Delivery Method: Circle system utilized Preoxygenation: Pre-oxygenation with 100% oxygen Induction Type: IV induction, Rapid sequence and Cricoid Pressure applied Ventilation: Mask ventilation without difficulty Laryngoscope Size: Miller and 2 Grade View: Grade I Tube type: Oral Tube size: 7.0 mm Number of attempts: 1 Airway Equipment and Method: Stylet, Oral airway and Bite block Placement Confirmation: ETT inserted through vocal cords under direct vision, positive ETCO2 and breath sounds checked- equal and bilateral Secured at: 21 cm Tube secured with: Tape Dental Injury: Teeth and Oropharynx as per pre-operative assessment

## 2022-02-15 NOTE — Progress Notes (Signed)
PICC order canceled due to pt has port a cath.  Dr. Dahlia Byes states ok ay to access [facility] rather than place PICC.  New order to cancel PICC.

## 2022-02-15 NOTE — Progress Notes (Signed)
Postoperative day #2 she had significant increased output overnight and now is brownish looking.  She also had hypotension that responded appropriately with crystalloid and colloid.  Her labs this morning show worsening the creatinine and drop in hemoglobin to 8.  Platelets also dropped.  Patient seen and examined she is not toxic however within the drain there is bilious output. I am concerned about a bowel perforation.  I do think safest thing to do even though she is not toxic nor peritonitic is to explore her.  Procedure discussed with patient in detail and the family.  Risks, benefits and possible complications.  We will continue resuscitation and since it is a second surgery we will need platelets as well as a unit of blood and post he for this morning emergently

## 2022-02-15 NOTE — Plan of Care (Signed)

## 2022-02-16 ENCOUNTER — Other Ambulatory Visit: Payer: Self-pay

## 2022-02-16 LAB — BPAM FFP
Blood Product Expiration Date: 202306132359
ISSUE DATE / TIME: 202306100811
Unit Type and Rh: 8400

## 2022-02-16 LAB — COMPREHENSIVE METABOLIC PANEL
ALT: 11 U/L (ref 0–44)
AST: 15 U/L (ref 15–41)
Albumin: 2.8 g/dL — ABNORMAL LOW (ref 3.5–5.0)
Alkaline Phosphatase: 36 U/L — ABNORMAL LOW (ref 38–126)
Anion gap: 4 — ABNORMAL LOW (ref 5–15)
BUN: 16 mg/dL (ref 6–20)
CO2: 20 mmol/L — ABNORMAL LOW (ref 22–32)
Calcium: 7.9 mg/dL — ABNORMAL LOW (ref 8.9–10.3)
Chloride: 112 mmol/L — ABNORMAL HIGH (ref 98–111)
Creatinine, Ser: 0.57 mg/dL (ref 0.44–1.00)
GFR, Estimated: >60 mL/min (ref >60)
Glucose, Bld: 148 mg/dL — ABNORMAL HIGH (ref 70–99)
Potassium: 3.6 mmol/L (ref 3.5–5.1)
Sodium: 136 mmol/L (ref 135–145)
Total Bilirubin: 0.7 mg/dL (ref 0.3–1.2)
Total Protein: 5 g/dL — ABNORMAL LOW (ref 6.5–8.1)

## 2022-02-16 LAB — PREPARE FRESH FROZEN PLASMA: Unit division: 0

## 2022-02-16 LAB — GLUCOSE, CAPILLARY
Glucose-Capillary: 144 mg/dL — ABNORMAL HIGH (ref 70–99)
Glucose-Capillary: 158 mg/dL — ABNORMAL HIGH (ref 70–99)
Glucose-Capillary: 159 mg/dL — ABNORMAL HIGH (ref 70–99)
Glucose-Capillary: 161 mg/dL — ABNORMAL HIGH (ref 70–99)
Glucose-Capillary: 189 mg/dL — ABNORMAL HIGH (ref 70–99)

## 2022-02-16 LAB — MAGNESIUM: Magnesium: 1.8 mg/dL (ref 1.7–2.4)

## 2022-02-16 LAB — TRIGLYCERIDES: Triglycerides: 94 mg/dL (ref <150)

## 2022-02-16 LAB — PHOSPHORUS: Phosphorus: 1.3 mg/dL — ABNORMAL LOW (ref 2.5–4.6)

## 2022-02-16 MED ORDER — SODIUM PHOSPHATES 45 MMOLE/15ML IV SOLN
20.0000 mmol | Freq: Once | INTRAVENOUS | Status: AC
  Administered 2022-02-16: 20 mmol via INTRAVENOUS
  Filled 2022-02-16: qty 6.67

## 2022-02-16 MED ORDER — SODIUM CHLORIDE 0.9 % IV SOLN: INTRAVENOUS | Status: DC

## 2022-02-16 MED ORDER — MAGNESIUM SULFATE 2 GM/50ML IV SOLN
2.0000 g | Freq: Once | INTRAVENOUS | Status: AC
  Administered 2022-02-16: 2 g via INTRAVENOUS
  Filled 2022-02-16: qty 50

## 2022-02-16 MED ORDER — TRACE MINERALS CU-MN-SE-ZN 300-55-60-3000 MCG/ML IV SOLN
INTRAVENOUS | Status: AC
  Filled 2022-02-16: qty 733.2

## 2022-02-16 MED ORDER — ACETAMINOPHEN 10 MG/ML IV SOLN
1000.0000 mg | Freq: Four times a day (QID) | INTRAVENOUS | Status: AC
  Administered 2022-02-16 – 2022-02-17 (×4): 1000 mg via INTRAVENOUS
  Filled 2022-02-16 (×4): qty 100

## 2022-02-16 NOTE — Progress Notes (Signed)
PHARMACY - TOTAL PARENTERAL NUTRITION CONSULT NOTE   Indication: Prolonged ileus  Patient Measurements: Height: '4\' 11"'$  (149.9 cm) Weight: 89.4 kg (197 lb) IBW/kg (Calculated) : 43.2 TPN AdjBW (KG): 54.7 Body mass index is 39.79 kg/m.  Assessment:  56 y.o. female  s/p laparotomy, excision of greater omental mass, abdominal wall reconstruction with [name],appendectomy, and placement of Prevena vac.  Glucose / Insulin: no insulin requirements prior to TPN --insulin requirements previous 24h 3 units (BG 121 - 158) Electrolytes: hypophosphatemia Renal: Scr 0.42--->1.29-->0.57 Hepatic: LFTs wnl Intake / Output; MIVF: 0.9 %  sodium chloride infusion at 100 mL/hr GI Imaging: GI Surgeries / Procedures: s/p laparotomy, excision of greater omental mass, abdominal wall reconstruction with [name],appendectomy, and placement of Prevena vac  Central access: 02/15/22 TPN start date: 02/15/22  Nutritional Goals: Goal TPN rate is 75 mL/hr (provides 110 g of protein and 2000 kcals per day)  RD Assessment:  Estimated Needs Total Energy Estimated Needs: 1800-2000 kcal/d Total Protein Estimated Needs: 90-110g/d Total Fluid Estimated Needs: 1.8-2.2L/d  Current Nutrition:  NPO  Plan:  advance TPN to 30m/hr at 1800 Nutritional Components Amino acids (using 15% Clinisol): 110 grams Dextrose: 282.6 grams Lipids (using 20% SMOFlipids): 60 grams kCal: 2000/24h  Electrolytes in TPN (standard): Na 590m/L, K 5051mL, Ca 5mE60m, Mg 5mEq24m and Phos 15mmo88m Cl:Ac 1:1 Add standard MVI and trace elements to TPN continue Sensitive q6h SSI and adjust as needed  reduce MIVF to 25 mL/hr  2 grams IV magnesium sulfate x 1 20 mmol sodium phosphate IV x 1 Monitor TPN labs on Mon/Thurs, daily until stable  RodneyDallie Piles2023,8:20 AM

## 2022-02-16 NOTE — Evaluation (Signed)
Physical Therapy Evaluation Patient Details Name: Debra [name] MRN: [id number] DOB: Apr 17, 1966 Today's Date: 02/16/2022  History of Present Illness  56 y/o female with h/o history of hypothyroidism, iron deficient anemia, colon cancer .  Here s/p mass removal with hernia repair and appendectomy 6/8, bowel perforation repair 6/10.  Clinical Impression  Pt clearly with weakness and pain but motivated to see what she could do.  She was ultimately able to get to sitting EOB (minimal dizziness, BP has been low but 100s/50s during session).  Pt was able to do a few bed exercises and then get to standing and ambulate >10 ft with very slow and guarded effort.  Pt in recliner post session, comfortable and feeling good about being able to start some activity as well as she did post surgery.  Per progress family expects to be able to take her home.     Recommendations for follow up therapy are one component of a multi-disciplinary discharge planning process, led by the attending physician.  Recommendations may be updated based on patient status, additional functional criteria and insurance authorization.  Follow Up Recommendations Home health PT (per progress)    Assistance Recommended at Discharge Frequent or constant Supervision/Assistance  Patient can return home with the following  A little help with bathing/dressing/bathroom;Assistance with cooking/housework;Assist for transportation;A little help with walking and/or transfers    Equipment Recommendations Rolling walker (2 wheels);Hospital bed  Recommendations for Other Services       Functional Status Assessment Patient has had a recent decline in their functional status and demonstrates the ability to make significant improvements in function in a reasonable and predictable amount of time.     Precautions / Restrictions Precautions Precautions: None Restrictions Weight Bearing Restrictions: No      Mobility  Bed  Mobility Overal bed mobility: Needs Assistance Bed Mobility: Rolling, Sidelying to Sit Rolling: Min assist Sidelying to sit: Mod assist       General bed mobility comments: Pt guarded with the transitions, but showed good effort and motivation despite clearly having some pain hesitation    Transfers Overall transfer level: Needs assistance Equipment used: Rolling walker (2 wheels) Transfers: Sit to/from Stand Sit to Stand: Min guard, Min assist           General transfer comment: Pt's height made transtion to standing from bed relatively easy.  Heavy UE reliance once upright.    Ambulation/Gait Ambulation/Gait assistance: Min guard Gait Distance (Feet): 12 Feet Assistive device: Rolling walker (2 wheels)         General Gait Details: Pt with very small, hesitant steps, but able to maintain balance and safety for effortful walk around the foot of the bed.  O2 did drop to 88% on room air, HR in the 100s.  Pt requested to stop due to fatigue but ultimately was very pleased with what she was able to do.  Stairs            Wheelchair Mobility    Modified Rankin (Stroke Patients Only)       Balance Overall balance assessment: Needs assistance Sitting-balance support: Bilateral upper extremity supported Sitting balance-Leahy Scale: Fair     Standing balance support: Bilateral upper extremity supported Standing balance-Leahy Scale: Fair                               Pertinent Vitals/Pain Pain Assessment Pain Assessment: 0-10 Pain Score: 1  Pain Location: adbomen  Home Living Family/patient expects to be discharged to:: Private residence Living Arrangements: Children Available Help at Discharge: Available 24 hours/day Type of Home:  (will likely d/c to daughter' shome) Home Access: Level entry     Alternate Level Stairs-Number of Steps: flight Home Layout: Two level;Bed/bath upstairs Home Equipment: None      Prior Function Prior  Level of Function : Independent/Modified Independent             Mobility Comments: Pt has not needed AD, drives, runs errands, etc       Hand Dominance        Extremity/Trunk Assessment   Upper Extremity Assessment Upper Extremity Assessment: Generalized weakness;Overall [facility] for tasks assessed    Lower Extremity Assessment Lower Extremity Assessment: Generalized weakness;Overall WFL for tasks assessed       Communication   Communication: Prefers language other than [place] (requests daughter to translate)  Cognition Arousal/Alertness: Awake/alert Behavior During Therapy: WFL for tasks assessed/performed Overall Cognitive Status: Within Functional Limits for tasks assessed                                          General Comments General comments (skin integrity, edema, etc.): Pt with expected hesitancy with mobility, but showed great effort/determination    Exercises General Exercises - Lower Extremity Heel Slides: Strengthening, 10 reps (with resisted leg ext) Hip ABduction/ADduction: Strengthening, 10 reps   Assessment/Plan    PT Assessment Patient needs continued PT services  PT Problem List Decreased strength;Decreased range of motion;Decreased activity tolerance;Decreased balance;Decreased mobility;Decreased knowledge of use of DME;Decreased safety awareness;Pain       PT Treatment Interventions DME instruction;Gait training;Stair training;Functional mobility training;Therapeutic activities;Therapeutic exercise;Balance training;Patient/family education    PT Goals (Current goals can be found in the Care Plan section)  Acute Rehab PT Goals Patient Stated Goal: go home PT Goal Formulation: With patient Time For Goal Achievement: 03/01/22 Potential to Achieve Goals: Fair    Frequency Min 2X/week     Co-evaluation               AM-PAC PT "6 Clicks" Mobility  Outcome Measure Help needed turning from your back to your side while  in a flat bed without using bedrails?: A Little Help needed moving from lying on your back to sitting on the side of a flat bed without using bedrails?: A Lot Help needed moving to and from a bed to a chair (including a wheelchair)?: A Little Help needed standing up from a chair using your arms (e.g., wheelchair or bedside chair)?: A Little Help needed to walk in hospital room?: A Little Help needed climbing 3-5 steps with a railing? : A Lot 6 Click Score: 16    End of Session Equipment Utilized During Treatment: Gait belt Activity Tolerance: Patient tolerated treatment well;Patient limited by fatigue Patient left: in chair;with call bell/phone within reach;with nursing/sitter in room;with family/visitor present Nurse Communication: Mobility status PT Visit Diagnosis: Muscle weakness (generalized) (M62.81);Difficulty in walking, not elsewhere classified (R26.2)    Time: 1530-1610 PT Time Calculation (min) (ACUTE ONLY): 40 min   Charges:   PT Evaluation $PT Eval Moderate Complexity: 1 Mod PT Treatments $Gait Training: 8-22 mins $Therapeutic Exercise: 8-22 mins        Kreg Shropshire, DPT 02/16/2022, 5:46 PM

## 2022-02-16 NOTE — Progress Notes (Signed)
Initial Nutrition Assessment  DOCUMENTATION CODES:  Obesity unspecified  INTERVENTION:  TPN per pharmacy, nutrition recommendations provided Advance diet as tolerated per surgery team.  NUTRITION DIAGNOSIS:  Increased nutrient needs related to wound healing, catabolic illness as evidenced by estimated needs.  GOAL:  Patient will meet greater than or equal to 90% of their needs  MONITOR:  I & O's, Labs, Diet advancement (TPN)  REASON FOR ASSESSMENT:  Consult New TPN/TNA  ASSESSMENT:  Pt with hx of hypothyroidism and stage 3 colon cancer (s/p left hemicolectomy and chemotherapy) admitted for planned abdominal surgery after a new mass was seen at a follow-up oncology appointment.  6/8: Op, open surgery - excision of pelvic mass from the greater omentum, incisional hernia repair, appendectomy, and wound vac placement 6/10 - Op, repair of small bowel perforation, wound vac applied. TPN initiated  Unable to reach pt on room phone at this time. Will follow-up for nutrition hx and physical exam at follow-up visit.   This admission, pt initially doing well after surgery on 6/8 and diet was able to be advanced to fulls. However, had significant increase in output from her drain 6/10 and output appeared bilious. Concern was raised for a bowel perforation and pt was emergently taken back to OR for repair. TPN requested on the day of surgery by attending to ensure that nutrition needs could be met for wound healing and recovery while bowel function returns and diet can be re-initiated. NGT in place to LIS.  Per outpatient oncology notes, pt reported good appetite recently and weight appears stable. Low phosphorus noted this AM, pharmacy team to replace.   Intake/Output Summary (Last 24 hours) at 02/16/2022 0813 Last data filed at 02/16/2022 4709 Gross per 24 hour  Intake 2542.62 ml  Output 3870 ml  Net -1327.38 ml  R JP drain: 364mL L JP Drain: 44mL Wound Vac: 116mL  Net IO Since  Admission: -1,095.94 mL [02/16/22 0813]   Nutritionally Relevant Medications: Scheduled Meds:  insulin aspart  0-9 Units Subcutaneous Q6H   levothyroxine  137 mcg Oral QAC breakfast   pantoprazole IV  40 mg Intravenous Q12H   Continuous Infusions:  sodium chloride 100 mL/hr at 02/16/22 0257   magnesium sulfate bolus IVPB     piperacillin-tazobactam (ZOSYN)  IV 3.375 g (02/16/22 0541)   TPN ADULT (ION) 35 mL/hr at 02/15/22 1843   PRN Meds: diphenhydrAMINE, ondansetron, prochlorperazine  Labs Reviewed: Phosphorus: 1.3 Triglycerides: 94 CBG ranges from 144-158 mg/dL over the last 24 hours  NUTRITION - FOCUSED PHYSICAL EXAM: Defer to in-person assessment  Diet Order:   Diet Order             Diet NPO time specified  Diet effective now                   EDUCATION NEEDS:  Not appropriate for education at this time  Skin:  Skin Assessment: Reviewed RN Assessment (Abdominal incisions with dains and wound vac in place)  Last BM:  6/7  Height:  Ht Readings from Last 1 Encounters:  02/13/22 $RemoveB'4\' 11"'EqGzeler$  (1.499 m)    Weight:  Wt Readings from Last 1 Encounters:  02/13/22 89.4 kg    Ideal Body Weight:  44.3 kg  BMI:  Body mass index is 39.79 kg/m.  Estimated Nutritional Needs:  Kcal:  1800-2000 kcal/d Protein:  90-110g/d Fluid:  1.8-2.2L/d   Ranell Patrick, RD, LDN Clinical Dietitian RD pager # available in [facility]  After hours/weekend pager # available in  [facility]

## 2022-02-16 NOTE — Progress Notes (Signed)
POD #1 from repair of bowel perforation and POD # 3 omental mass resection w abd wall reconstruction\ Doing better today. Blood pressures are soft but responded to some albumin. Labs are good with normalization of creatinine No flatus UO 3300 JP drain 510 NGT 10 Phosphorus low  PE NAD Abd: soft, Vac in place, JPs serosanguinous, no bile. No evidence of necrotizing infection.  A/p  Bowel perforation following abdominal wall reconstruction and resection of mass. Expected postoperative course. If blood pressure continues to drop will likely need to start on steroids for sepsis management. Continue supportive care including the NG tube, Foley catheter and JP drains. Continue TPN Replace electrolytes

## 2022-02-17 ENCOUNTER — Encounter: Payer: Self-pay | Admitting: Surgery

## 2022-02-17 ENCOUNTER — Inpatient Hospital Stay: Payer: Medicaid Other

## 2022-02-17 LAB — COMPREHENSIVE METABOLIC PANEL
ALT: 10 U/L (ref 0–44)
AST: 15 U/L (ref 15–41)
Albumin: 2.5 g/dL — ABNORMAL LOW (ref 3.5–5.0)
Alkaline Phosphatase: 51 U/L (ref 38–126)
Anion gap: 4 — ABNORMAL LOW (ref 5–15)
BUN: 14 mg/dL (ref 6–20)
CO2: 23 mmol/L (ref 22–32)
Calcium: 7.8 mg/dL — ABNORMAL LOW (ref 8.9–10.3)
Chloride: 113 mmol/L — ABNORMAL HIGH (ref 98–111)
Creatinine, Ser: 0.52 mg/dL (ref 0.44–1.00)
GFR, Estimated: >60 mL/min (ref >60)
Glucose, Bld: 186 mg/dL — ABNORMAL HIGH (ref 70–99)
Potassium: 3.4 mmol/L — ABNORMAL LOW (ref 3.5–5.1)
Sodium: 140 mmol/L (ref 135–145)
Total Bilirubin: 0.7 mg/dL (ref 0.3–1.2)
Total Protein: 5.1 g/dL — ABNORMAL LOW (ref 6.5–8.1)

## 2022-02-17 LAB — PREPARE PLATELET PHERESIS: Unit division: 0

## 2022-02-17 LAB — GLUCOSE, CAPILLARY
Glucose-Capillary: 178 mg/dL — ABNORMAL HIGH (ref 70–99)
Glucose-Capillary: 216 mg/dL — ABNORMAL HIGH (ref 70–99)
Glucose-Capillary: 256 mg/dL — ABNORMAL HIGH (ref 70–99)

## 2022-02-17 LAB — PHOSPHORUS: Phosphorus: 1.5 mg/dL — ABNORMAL LOW (ref 2.5–4.6)

## 2022-02-17 LAB — BPAM PLATELET PHERESIS
Blood Product Expiration Date: 202306102359
ISSUE DATE / TIME: 202306100816
Unit Type and Rh: 6200

## 2022-02-17 LAB — BPAM RBC
Blood Product Expiration Date: 202307132359
Blood Product Expiration Date: 202307132359
ISSUE DATE / TIME: 202306100811
ISSUE DATE / TIME: 202306100811
Unit Type and Rh: 5100
Unit Type and Rh: 5100

## 2022-02-17 LAB — MAGNESIUM: Magnesium: 2.3 mg/dL (ref 1.7–2.4)

## 2022-02-17 LAB — TYPE AND SCREEN
ABO/RH(D): O POS
Antibody Screen: NEGATIVE
Unit division: 0
Unit division: 0

## 2022-02-17 LAB — CBC
HCT: 24.2 % — ABNORMAL LOW (ref 36.0–46.0)
Hemoglobin: 8.2 g/dL — ABNORMAL LOW (ref 12.0–15.0)
MCH: 29.4 pg (ref 26.0–34.0)
MCHC: 33.9 g/dL (ref 30.0–36.0)
MCV: 86.7 fL (ref 80.0–100.0)
Platelets: 75 10*3/uL — ABNORMAL LOW (ref 150–400)
RBC: 2.79 MIL/uL — ABNORMAL LOW (ref 3.87–5.11)
RDW: 15.1 % (ref 11.5–15.5)
WBC: 4.3 10*3/uL (ref 4.0–10.5)
nRBC: 0.9 % — ABNORMAL HIGH (ref 0.0–0.2)

## 2022-02-17 LAB — PROTIME-INR
INR: 1.3 — ABNORMAL HIGH (ref 0.8–1.2)
Prothrombin Time: 16.2 seconds — ABNORMAL HIGH (ref 11.4–15.2)

## 2022-02-17 LAB — SURGICAL PATHOLOGY

## 2022-02-17 LAB — PREPARE RBC (CROSSMATCH)

## 2022-02-17 LAB — TRIGLYCERIDES: Triglycerides: 124 mg/dL (ref <150)

## 2022-02-17 MED ORDER — SODIUM CHLORIDE 0.9% FLUSH: 10.0000 mL | INTRAVENOUS | Status: DC | PRN

## 2022-02-17 MED ORDER — ACETAMINOPHEN 325 MG PO TABS
650.0000 mg | ORAL_TABLET | Freq: Four times a day (QID) | ORAL | Status: DC | PRN
  Administered 2022-02-17 – 2022-02-18 (×2): 650 mg via ORAL
  Filled 2022-02-17 (×4): qty 2

## 2022-02-17 MED ORDER — POTASSIUM PHOSPHATES 15 MMOLE/5ML IV SOLN
30.0000 mmol | Freq: Once | INTRAVENOUS | Status: AC
  Administered 2022-02-17: 30 mmol via INTRAVENOUS
  Filled 2022-02-17: qty 10

## 2022-02-17 MED ORDER — FUROSEMIDE 10 MG/ML IJ SOLN
20.0000 mg | Freq: Once | INTRAMUSCULAR | Status: AC
  Administered 2022-02-17: 20 mg via INTRAVENOUS
  Filled 2022-02-17: qty 4

## 2022-02-17 MED ORDER — SODIUM CHLORIDE 0.9% FLUSH
10.0000 mL | Freq: Two times a day (BID) | INTRAVENOUS | Status: DC
  Administered 2022-02-17 – 2022-03-09 (×39): 10 mL
  Administered 2022-03-10: 20 mL
  Administered 2022-03-10 – 2022-03-30 (×29): 10 mL

## 2022-02-17 MED ORDER — TRACE MINERALS CU-MN-SE-ZN 300-55-60-3000 MCG/ML IV SOLN
INTRAVENOUS | Status: AC
  Filled 2022-02-17: qty 733.2

## 2022-02-17 NOTE — Progress Notes (Signed)

## 2022-02-17 NOTE — Progress Notes (Addendum)
Debra [name] Day(s): 4.   Post op day(s): 2 Days Post-Op.   Interval History:  Patient seen and examined No acute events or new complaints overnight.  Leukopenia seen yesterday (06/11) now resolved; 4.3K Hgb stable to 8.2 PLT improved to 75K INR 1.3 (improving) Renal function normal; sCr - 0.52; UO - 1450 ccs Hypokalemia to 3.4; hypophosphatemia to 1.5 NGT with 150 ccs out in last 24 hours Surgical Drains:   - Left drain (Intraperitoneal) 220 ccs; serosanguinous  - Right Drain (Retro Rectus) 355 ccs; serosanguinous She is NPO; on TPN  Vital signs in last 24 hours: [min-max] current  Temp:  [98.4 F (36.9 C)-98.9 F (37.2 C)] 98.6 F (37 C) (06/12 0800) Pulse Rate:  [92-99] 92 (06/12 0800) Resp:  [18-20] 18 (06/12 0800) BP: (102-129)/(53-70) 123/67 (06/12 0800) SpO2:  [91 %-94 %] 93 % (06/12 0800)     Height: '4\' 11"'$  (149.9 cm) Weight: 89.4 kg BMI (Calculated): 39.77   Intake/Output last 2 shifts:  06/11 0701 - 06/12 0700 In: 1795.1 [I.V.:1190.3; IV Piggyback:604.8] Out: 2175 [Urine:1450; Emesis/NG output:150; Drains:575]   Physical Exam:  Constitutional: alert, cooperative and no distress  HEENT: NGT in place; clamped Respiratory: breathing non-labored at rest  Cardiovascular: regular rate and sinus rhythm  Gastrointestinal: Soft, incisional soreness, non-distended, no rebound/guarding. Surgical drain in LLQ and RLQ; output serosanguinous Genitourinary: Foley in place  Integumentary: Laparotomy with Prevena in place; good seal   Labs:     Latest Ref Rng & Units 02/17/2022    5:00 AM 02/15/2022    4:30 AM 02/14/2022    6:58 AM  CBC  WBC 4.0 - 10.5 K/uL 4.3  3.4  4.1   Hemoglobin 12.0 - 15.0 g/dL 8.2  8.0  9.0   Hematocrit 36.0 - 46.0 % 24.2  24.1  25.8   Platelets 150 - 400 K/uL 75  59  98       Latest Ref Rng & Units 02/17/2022    5:00 AM 02/16/2022    5:03 AM 02/15/2022    4:30 AM  CMP  Glucose 70 - 99 mg/dL 186   148  121   BUN 6 - 20 mg/dL '14  16  22   '$ Creatinine 0.44 - 1.00 mg/dL 0.52  0.57  1.29   Sodium 135 - 145 mmol/L 140  136  132   Potassium 3.5 - 5.1 mmol/L 3.4  3.6  4.0   Chloride 98 - 111 mmol/L 113  112  104   CO2 22 - 32 mmol/L '23  20  22   '$ Calcium 8.9 - 10.3 mg/dL 7.8  7.9  7.8   Total Protein 6.5 - 8.1 g/dL 5.1  5.0  5.3   Total Bilirubin 0.3 - 1.2 mg/dL 0.7  0.7  0.9   Alkaline Phos 38 - 126 U/L 51  36  48   AST 15 - 41 U/L '15  15  20   '$ ALT 0 - 44 U/L '10  11  15      '$ Imaging studies:  KUB + CXR (02/17/2022) personally reviewed showing non-obstructive bowel gas pattern, air in colon, and radiologist report reviewed below: IMPRESSION: No significant bowel dilatation is noted. Mild bibasilar subsegmental atelectasis is noted.   Assessment/Plan: 56 y.o. female 2 Days Post-Op s/p re-opening of laparotomy for repair of small bowel perforation and 4 days s/p laparotomy, excision of greater omental mass, abdominal wall reconstruction with [name],appendectomy, and placement of Prevena  vac.   - Will clamp NGT this morning; x4 hours. Check residuals and if less than 150 ccs, we can remove this and start CLD  - Continue NPO for now; pending clamping trial  - Continue TPN; at goal rate; replete electrolytes and monitor; d/w pharmacy and greatly appreciate their assistance   - Continue surgical drains; monitor abd record output  - Continue Prevena vac  - Monitor abdominal examination; on-going bowel function  - Pain control prn; antemetic prn  - Out of bed; therapies on board; recommendations are HHPT  All of the above findings and recommendations were discussed with the patient, and the medical team, and all of patient's questions were answered to her expressed satisfaction.  -- [name], PA-C [facility] 02/17/2022, 8:02 AM M-F: 7am - 4pm

## 2022-02-17 NOTE — Progress Notes (Signed)
Notify Larena Glassman who answered Dr. Lysle Pearl phone for the pt elevated temp of 103 and HR of 118, they sated they will put orders after he is out of surgery. Will continue to monitor.

## 2022-02-17 NOTE — Progress Notes (Signed)
Physical Therapy Treatment Patient Details Name: Debra [name] MRN: [id number] DOB: [birth year] Today's Date: 02/17/2022   History of Present Illness Pt is a 56 y.o. female with PMH including hypothyroidism, iron deficient anemia, colon cancer.  Here s/p mass removal with hernia repair and appendectomy 6/8, bowel perforation repair 6/10.    PT Comments    Pt asleep in bed at start of today's session with pts daughter present and willing to interpret. Pt required min to mod A for bed mobility and sit-to-stand transfer and RW with CGA for ambulation. Pt ambulated 5 ft from bed before requesting to sit down due to fatigue. Ambulation required increased time and effort to complete. Pt reported 0/10 pain in her abdomen, but often guarded surgical site using R UE during transitions and coughing while seated EOB. PT would benefit from use of medical interpreter rather than family during future sessions to ensure pt safety and accuracy of communication. Currently recommend SNF due to pt current assistance required for mobility tasks and decreased activity tolerance.    Recommendations for follow up therapy are one component of a multi-disciplinary discharge planning process, led by the attending physician.  Recommendations may be updated based on patient status, additional functional criteria and insurance authorization.  Follow Up Recommendations  Skilled nursing-short term rehab (<3 hours/day)     Assistance Recommended at Discharge Frequent or constant Supervision/Assistance  Patient can return home with the following Help with stairs or ramp for entrance;Assist for transportation;Assistance with cooking/housework;Two people to help with bathing/dressing/bathroom;Two people to help with walking and/or transfers   Equipment Recommendations  Rolling walker (2 wheels);Hospital bed    Recommendations for Other Services       Precautions / Restrictions Precautions Precautions:  Fall Restrictions Weight Bearing Restrictions: No Other Position/Activity Restrictions: Abdominal surgery     Mobility  Bed Mobility Overal bed mobility: Needs Assistance Bed Mobility: Rolling, Sidelying to Sit Rolling: Min assist Sidelying to sit: Mod assist       General bed mobility comments: Pt guarded abdomen and used UE on rail to assist in supine to sit EOB. Required assist with LE/truncal mgmt    Transfers Overall transfer level: Needs assistance Equipment used: Rolling walker (2 wheels) Transfers: Sit to/from Stand Sit to Stand: Min guard, Min assist           General transfer comment: Increased time/effort to complete transfers with CGA to min A for standing upright    Ambulation/Gait Ambulation/Gait assistance: Min guard Gait Distance (Feet): 5 Feet Assistive device: Rolling walker (2 wheels) Gait Pattern/deviations: Step-to pattern, Decreased step length - right, Decreased step length - left, Decreased stride length, Wide base of support Gait velocity: decreased     General Gait Details: Pt took very small steps requiring increased time and effort to ambulate 5 feet in forward direction with heavy reliance on bilat UE support on RW. Pt requested to stop due to fatigue and chair was brought behind pt to sit down.   Stairs             Wheelchair Mobility    Modified Rankin (Stroke Patients Only)       Balance Overall balance assessment: Needs assistance Sitting-balance support: Bilateral upper extremity supported, Feet supported Sitting balance-Leahy Scale: Fair Sitting balance - Comments: Pt can sit EOB without LOB while reaching for RW with one UE   Standing balance support: Bilateral upper extremity supported Standing balance-Leahy Scale: Fair Standing balance comment: Pt relied heavily on RW for UE support during  ambulation. No LOB.                            Cognition Arousal/Alertness: Awake/alert Behavior During  Therapy: WFL for tasks assessed/performed Overall Cognitive Status: Within Functional Limits for tasks assessed                                 General Comments: Pt asleep at start of session, but alert and oriented.        Exercises Other Exercises Other Exercises: supine ther-ex bilat AP x10 ea    General Comments        Pertinent Vitals/Pain Pain Assessment Pain Assessment: 0-10 Pain Score: 0-No pain Pain Location: adbomen Pain Descriptors / Indicators: Operative site guarding (Pt reported 0/10 pain, but did guard abdomen with R UE while seated and coughing) Pain Intervention(s): Limited activity within patient's tolerance, Monitored during session, Repositioned    Home Living                          Prior Function            PT Goals (current goals can now be found in the care plan section) Acute Rehab PT Goals Patient Stated Goal: to go home PT Goal Formulation: With patient/family Time For Goal Achievement: 03/01/22 Potential to Achieve Goals: Fair Progress towards PT goals: Progressing toward goals    Frequency    Min 2X/week      PT Plan Discharge plan needs to be updated    Co-evaluation              AM-PAC PT "6 Clicks" Mobility   Outcome Measure  Help needed turning from your back to your side while in a flat bed without using bedrails?: A Little Help needed moving from lying on your back to sitting on the side of a flat bed without using bedrails?: A Lot Help needed moving to and from a bed to a chair (including a wheelchair)?: A Little Help needed standing up from a chair using your arms (e.g., wheelchair or bedside chair)?: A Little Help needed to walk in hospital room?: A Lot Help needed climbing 3-5 steps with a railing? : A Lot 6 Click Score: 15    End of Session   Activity Tolerance: Patient tolerated treatment well;Patient limited by fatigue Patient left: in chair;with call bell/phone within reach;with  chair alarm set;with family/visitor present;with SCD's reapplied;Other (comment) (with pillow under bilat LEs to float heels) Nurse Communication: Mobility status PT Visit Diagnosis: Muscle weakness (generalized) (M62.81);Difficulty in walking, not elsewhere classified (R26.2)     Time: 8527-7824 PT Time Calculation (min) (ACUTE ONLY): 31 min  Charges:                        Rella Larve, SPT    Debra [name] 02/17/2022, 12:20 PM

## 2022-02-17 NOTE — Progress Notes (Signed)
PHARMACY - TOTAL PARENTERAL NUTRITION CONSULT NOTE   Indication: Prolonged ileus  Patient Measurements: Height: '4\' 11"'$  (149.9 cm) Weight: 89.4 kg (197 lb) IBW/kg (Calculated) : 43.2 TPN AdjBW (KG): 54.7 Body mass index is 39.79 kg/m.  Assessment:  56 y.o. female  s/p laparotomy, excision of greater omental mass, abdominal wall reconstruction with [name],appendectomy, and placement of Prevena vac.  Glucose / Insulin: no insulin requirements prior to TPN --insulin requirements previous 24h 8 units (BG 144 - 186) Electrolytes: hypophosphatemia Renal: Scr 0.42--->1.29-->0.52 Hepatic: LFTs wnl Intake / Output; MIVF: 0.9 %  sodium chloride infusion at 25 mL/hr GI Imaging: GI Surgeries / Procedures: s/p laparotomy, excision of greater omental mass, abdominal wall reconstruction with [name],appendectomy, and placement of Prevena vac  Central access: 02/15/22 TPN start date: 02/15/22  Nutritional Goals: Goal TPN rate is 75 mL/hr (provides 110 g of protein and 2000 kcals per day)  RD Assessment:  Estimated Needs Total Energy Estimated Needs: 1800-2000 kcal/d Total Protein Estimated Needs: 90-110g/d Total Fluid Estimated Needs: 1.8-2.2L/d  Current Nutrition:  NPO  Plan:  Continue TPN at goal rate of 35m/hr Nutritional Components Amino acids (using 15% Clinisol): 110 grams Dextrose: 282.6 grams Lipids (using 20% SMOFlipids): 60 grams kCal: 2000/24h  Electrolytes in TPN (standard): Na 545m/L, K 5068mL, Ca 5mE62m, Mg 5mEq58m and increasing Phos to 25mmo1m Cl:Ac 1:1 Add standard MVI and trace elements to TPN Continue Sensitive q6h SSI and adjust as needed  Continue MIVF to 25 mL/hr  30 mmol sodium phosphate IV x 1 Monitor TPN labs on Mon/Thurs, daily until stable  Rodger Giangregorio A Margaux Engen 02/17/2022,7:49 AM

## 2022-02-18 ENCOUNTER — Inpatient Hospital Stay: Payer: Medicaid Other

## 2022-02-18 ENCOUNTER — Telehealth: Payer: Self-pay

## 2022-02-18 LAB — URINALYSIS, COMPLETE (UACMP) WITH MICROSCOPIC
Bacteria, UA: NONE SEEN
Bilirubin Urine: NEGATIVE
Glucose, UA: 150 mg/dL — AB
Hgb urine dipstick: NEGATIVE
Ketones, ur: NEGATIVE mg/dL
Leukocytes,Ua: NEGATIVE
Nitrite: NEGATIVE
Protein, ur: 100 mg/dL — AB
Specific Gravity, Urine: 1.023 (ref 1.005–1.030)
Squamous Epithelial / HPF: NONE SEEN (ref 0–5)
pH: 6 (ref 5.0–8.0)

## 2022-02-18 LAB — GLUCOSE, CAPILLARY
Glucose-Capillary: 267 mg/dL — ABNORMAL HIGH (ref 70–99)
Glucose-Capillary: 279 mg/dL — ABNORMAL HIGH (ref 70–99)
Glucose-Capillary: 292 mg/dL — ABNORMAL HIGH (ref 70–99)
Glucose-Capillary: 295 mg/dL — ABNORMAL HIGH (ref 70–99)
Glucose-Capillary: 301 mg/dL — ABNORMAL HIGH (ref 70–99)

## 2022-02-18 LAB — BASIC METABOLIC PANEL
Anion gap: 5 (ref 5–15)
BUN: 15 mg/dL (ref 6–20)
CO2: 23 mmol/L (ref 22–32)
Calcium: 7.9 mg/dL — ABNORMAL LOW (ref 8.9–10.3)
Chloride: 109 mmol/L (ref 98–111)
Creatinine, Ser: 0.46 mg/dL (ref 0.44–1.00)
GFR, Estimated: >60 mL/min (ref >60)
Glucose, Bld: 283 mg/dL — ABNORMAL HIGH (ref 70–99)
Potassium: 3.7 mmol/L (ref 3.5–5.1)
Sodium: 137 mmol/L (ref 135–145)

## 2022-02-18 LAB — PHOSPHORUS: Phosphorus: 2.3 mg/dL — ABNORMAL LOW (ref 2.5–4.6)

## 2022-02-18 LAB — CBC
HCT: 30.6 % — ABNORMAL LOW (ref 36.0–46.0)
Hemoglobin: 10.5 g/dL — ABNORMAL LOW (ref 12.0–15.0)
MCH: 29.2 pg (ref 26.0–34.0)
MCHC: 34.3 g/dL (ref 30.0–36.0)
MCV: 85.2 fL (ref 80.0–100.0)
Platelets: 114 10*3/uL — ABNORMAL LOW (ref 150–400)
RBC: 3.59 MIL/uL — ABNORMAL LOW (ref 3.87–5.11)
RDW: 14.7 % (ref 11.5–15.5)
WBC: 7 10*3/uL (ref 4.0–10.5)
nRBC: 2.3 % — ABNORMAL HIGH (ref 0.0–0.2)

## 2022-02-18 LAB — MAGNESIUM: Magnesium: 2.1 mg/dL (ref 1.7–2.4)

## 2022-02-18 MED ORDER — TRACE MINERALS CU-MN-SE-ZN 300-55-60-3000 MCG/ML IV SOLN
INTRAVENOUS | Status: AC
  Filled 2022-02-18: qty 733.2

## 2022-02-18 MED ORDER — IOHEXOL 300 MG/ML  SOLN
80.0000 mL | Freq: Once | INTRAMUSCULAR | Status: AC | PRN
  Administered 2022-02-18: 80 mL via INTRAVENOUS

## 2022-02-18 MED ORDER — IOHEXOL 9 MG/ML PO SOLN
500.0000 mL | Freq: Once | ORAL | Status: AC | PRN
  Administered 2022-02-18 – 2022-02-23 (×2): 500 mL

## 2022-02-18 MED ORDER — IOHEXOL 9 MG/ML PO SOLN: 500.0000 mL | Freq: Once | ORAL | Status: DC | PRN

## 2022-02-18 MED ORDER — INSULIN DETEMIR 100 UNIT/ML ~~LOC~~ SOLN
8.0000 [IU] | Freq: Two times a day (BID) | SUBCUTANEOUS | Status: DC
Start: 2022-02-18 — End: 2022-02-20
  Administered 2022-02-18 – 2022-02-20 (×5): 8 [IU] via SUBCUTANEOUS
  Filled 2022-02-18 (×5): qty 0.08

## 2022-02-18 NOTE — Progress Notes (Addendum)
[place] [facility] Day(s): 5.   Post op day(s): 3 Days Post-Op.   Interval History:  Patient seen and examined Overnight, developed fever to 103.23F at 2100 and tachycardia; most revent temp 101.23F (0430) She also initially passed clamping trial but developed worsening nausea overnight and NGT replaced This morning, patient reports she is feeling better; upper abdominal pain CBC is pending this morning Renal function normal; sCr - 0.46; UO - 2850 ccs Hypophosphatemia to 2.3 NGT with 150 ccs out in last 24 hours Surgical Drains:   - Left drain (Intraperitoneal) 220 ccs; serosanguinous  - Right Drain (Retro Rectus) 280 ccs; serous Midline Wound vac; output 150 ccs; serosanguinous She is NPO; on TPN  Pathology reviewed:  -- Metastatic adenocarcinoma; colon origin -- Plan for output follow up with oncology (Dr Tasia Catchings)  Vital signs in last 24 hours: [min-max] current  Temp:  [98.6 F (37 C)-103.1 F (39.5 C)] 101.1 F (38.4 C) (06/13 0433) Pulse Rate:  [92-119] 113 (06/13 0433) Resp:  [17-18] 17 (06/12 2346) BP: (123-145)/(67-84) 134/84 (06/13 0433) SpO2:  [91 %-96 %] 96 % (06/13 0433)     Height: '4\' 11"'$  (149.9 cm) Weight: 89.4 kg BMI (Calculated): 39.77   Intake/Output last 2 shifts:  06/12 0701 - 06/13 0700 In: 2324 [I.V.:2176.3; IV Piggyback:147.7] Out: 4105 [Urine:2850; Emesis/NG output:600; Drains:655]   Physical Exam:  Constitutional: alert, cooperative and no distress  HEENT: NGT in place; output bilious  Respiratory: breathing non-labored at rest  Cardiovascular: regular rate and sinus rhythm  Gastrointestinal: Soft, incisional soreness more in the upper abdomen, non-distended, no rebound/guarding. Surgical drain in LLQ; serosanguinous and RLQ; serous Genitourinary: Foley in place  Integumentary: Midline wound with wound vac; will change later today and document separately   Labs:     Latest Ref Rng & Units 02/17/2022     5:00 AM 02/15/2022    4:30 AM 02/14/2022    6:58 AM  CBC  WBC 4.0 - 10.5 K/uL 4.3  3.4  4.1   Hemoglobin 12.0 - 15.0 g/dL 8.2  8.0  9.0   Hematocrit 36.0 - 46.0 % 24.2  24.1  25.8   Platelets 150 - 400 K/uL 75  59  98       Latest Ref Rng & Units 02/18/2022    6:03 AM 02/17/2022    5:00 AM 02/16/2022    5:03 AM  CMP  Glucose 70 - 99 mg/dL 283  186  148   BUN 6 - 20 mg/dL '15  14  16   '$ Creatinine 0.44 - 1.00 mg/dL 0.46  0.52  0.57   Sodium 135 - 145 mmol/L 137  140  136   Potassium 3.5 - 5.1 mmol/L 3.7  3.4  3.6   Chloride 98 - 111 mmol/L 109  113  112   CO2 22 - 32 mmol/L '23  23  20   '$ Calcium 8.9 - 10.3 mg/dL 7.9  7.8  7.9   Total Protein 6.5 - 8.1 g/dL  5.1  5.0   Total Bilirubin 0.3 - 1.2 mg/dL  0.7  0.7   Alkaline Phos 38 - 126 U/L  51  36   AST 15 - 41 U/L  15  15   ALT 0 - 44 U/L  10  11      Imaging studies:  Venous US reviewed overnight; no DVT   Assessment/Plan: 56 y.o. female now with fevers and tachycardia  3 Days Post-Op s/p re-opening of  laparotomy for repair of small bowel perforation and 4 days s/p laparotomy, excision of greater omental mass, abdominal wall reconstruction with [name],appendectomy, and placement of Prevena vac.   - Will get CT Chest/Abdomen/Pelvis this morning to reassess for intra-abdominal, and other processes, given her acute fevers and tachycardia.   - Continue NGT to LIS: monitor and record output  - Continue TPN; at goal rate; replete electrolytes and monitor; d/w pharmacy and greatly appreciate their assistance - Wound vac change today; Will ask for [place] RN assistance starting Friday 06/16 to get her on MWF schedule  - Will continue foley today given acute changes overnight; good UO   - Continue surgical drains; monitor and record output  - Monitor abdominal examination; on-going bowel function  - Pain control prn; antemetic prn  - Out of bed; therapies on board; recommendations are HHPT  All of the above findings and  recommendations were discussed with the patient, and the medical team, and all of patient's questions were answered to her expressed satisfaction.  -- [name], PA-C [facility] 02/18/2022, 7:12 AM M-F: 7am - 4pm

## 2022-02-18 NOTE — Telephone Encounter (Signed)
Please schedule pt for MD only and I will call her with appt info. Please schedule spanish interpreter for visit.

## 2022-02-18 NOTE — Telephone Encounter (Signed)
-----   Message from Earlie Server, MD sent at 02/17/2022 11:42 PM EDT ----- Please schedule patient to follow-up with me to go over results..  MD visit only.

## 2022-02-18 NOTE — Consult Note (Signed)
[facility] Nurse Consult Note: Reason for Consult: WOC consult requested to change Vac dressing on Fri, 6/16.  Midline abd Vac is intact with good seal to 128m cont suction. Discussed plan of care with surgical PA; he plans to change the first post-op dressing today and assess the wound, then [place] will plan to begin M/W/F dressing changes on Fri. DJulien GirtMSN, RN, CMall[place] CPine [place]

## 2022-02-18 NOTE — Progress Notes (Signed)
PHARMACY - TOTAL PARENTERAL NUTRITION CONSULT NOTE   Indication: Prolonged ileus  Patient Measurements: Height: '4\' 11"'$  (149.9 cm) Weight: 89.4 kg (197 lb) IBW/kg (Calculated) : 43.2 TPN AdjBW (KG): 54.7 Body mass index is 39.79 kg/m.  Assessment:  56 y.o. female  s/p laparotomy, excision of greater omental mass, abdominal wall reconstruction with [name],appendectomy, and placement of Prevena vac.  Glucose / Insulin: no insulin requirements prior to TPN --insulin requirements previous 24h 18 units (BG 216-283) Electrolytes: hypophosphatemia Renal: Scr 0.42>1.29>0.52>0.46 Hepatic: LFTs wnl Intake / Output; MIVF: 0.9 %  sodium chloride infusion at 25 mL/hr GI Imaging: GI Surgeries / Procedures: s/p laparotomy, excision of greater omental mass, abdominal wall reconstruction with [name],appendectomy, and placement of Prevena vac  Central access: 02/15/22 TPN start date: 02/15/22  Nutritional Goals: Goal TPN rate is 75 mL/hr (provides 110 g of protein and 2000 kcals per day)  RD Assessment:  Estimated Needs Total Energy Estimated Needs: 1800-2000 kcal/d Total Protein Estimated Needs: 90-110g/d Total Fluid Estimated Needs: 1.8-2.2L/d  Current Nutrition:  NPO  Plan:  Continue TPN at goal rate of 78m/hr Nutritional Components Amino acids (using 15% Clinisol): 110 grams Dextrose: 282.6 grams Lipids (using 20% SMOFlipids): 60 grams kCal: 2000/24h  Electrolytes in TPN (standard): Na 560m/L, K 5017mL, Ca 5mE80m, Mg 5mEq73m and Phos increased to 25 mmol/L. Cl:Ac 1:1 Add standard MVI and trace elements to TPN Continue Sensitive q6h SSI and adjust as needed, added levemir 8 units BID  Continue MIVF to 25 mL/hr  Monitor TPN labs on Mon/Thurs, daily until stable  MorgaDarnelle BosrmD 02/18/2022,8:28 AM

## 2022-02-18 NOTE — Progress Notes (Signed)
Physical Therapy Treatment Patient Details Name: Satoria Dunlop MRN: [id number] DOB: 13-Apr-1966 Today's Date: 02/18/2022   History of Present Illness Pt is a 56 y.o. female with PMH including hypothyroidism, iron deficient anemia, colon cancer.  Here s/p mass removal with hernia repair and appendectomy 6/8, bowel perforation repair 6/10.    PT Comments    Pt is making gradual progress towards goals with ability to ambulate short distance to recliner, currently limited by NG tube connected to wall suction with heavy output. Reinforced expectation of sitting in recliner daily as much as tolerated. Limited endurance with there-ex. As patient has no insurance, will need increased NURSING LED MOBILITY in addition to therapy as often as possible to progress pt's functional outcomes. Will continue to progress.  Recommendations for follow up therapy are one component of a multi-disciplinary discharge planning process, led by the attending physician.  Recommendations may be updated based on patient status, additional functional criteria and insurance authorization.  Follow Up Recommendations  Skilled nursing-short term rehab (<3 hours/day)     Assistance Recommended at Discharge Frequent or constant Supervision/Assistance  Patient can return home with the following Help with stairs or ramp for entrance;Assist for transportation;Assistance with cooking/housework;A lot of help with walking and/or transfers;A lot of help with bathing/dressing/bathroom   Equipment Recommendations  Rolling walker (2 wheels);Hospital bed    Recommendations for Other Services       Precautions / Restrictions Precautions Precautions: Fall Restrictions Weight Bearing Restrictions: No     Mobility  Bed Mobility Overal bed mobility: Needs Assistance Bed Mobility: Supine to Sit     Supine to sit: Min assist     General bed mobility comments: follows commands well and able to participate. Once seated EOB,  upright posture. Spanish interp used throughout session    Transfers Overall transfer level: Needs assistance Equipment used: Rolling walker (2 wheels) Transfers: Sit to/from Stand Sit to Stand: Min guard           General transfer comment: increased time/effort. Once standing, upright posture    Ambulation/Gait Ambulation/Gait assistance: Min assist Gait Distance (Feet): 3 Feet Assistive device: Rolling walker (2 wheels) Gait Pattern/deviations: Step-to pattern, Decreased step length - right, Decreased step length - left, Decreased stride length, Wide base of support       General Gait Details: cues for sequencing with small step to gait pattern performed. Pt limited in distance due to NG tube   Stairs             Wheelchair Mobility    Modified Rankin (Stroke Patients Only)       Balance Overall balance assessment: Needs assistance Sitting-balance support: Bilateral upper extremity supported, Feet supported Sitting balance-Leahy Scale: Fair     Standing balance support: Bilateral upper extremity supported Standing balance-Leahy Scale: Fair                              Cognition Arousal/Alertness: Awake/alert Behavior During Therapy: WFL for tasks assessed/performed Overall Cognitive Status: Within Functional Limits for tasks assessed                                 General Comments: pt with flat affect, however agreeable for session        Exercises Other Exercises Other Exercises: supine ther-ex performed on B LE including AP, SLRs, hip abd/add, and QS. 12 reps performed with min  assist    General Comments        Pertinent Vitals/Pain Pain Assessment Pain Assessment: Faces Faces Pain Scale: Hurts a little bit Pain Location: adbomen Pain Descriptors / Indicators: Operative site guarding Pain Intervention(s): Limited activity within patient's tolerance, Repositioned    Home Living                           Prior Function            PT Goals (current goals can now be found in the care plan section) Acute Rehab PT Goals Patient Stated Goal: to go home PT Goal Formulation: With patient/family Time For Goal Achievement: 03/01/22 Potential to Achieve Goals: Fair Progress towards PT goals: Progressing toward goals    Frequency    Min 2X/week      PT Plan Current plan remains appropriate    Co-evaluation              AM-PAC PT "6 Clicks" Mobility   Outcome Measure  Help needed turning from your back to your side while in a flat bed without using bedrails?: A Little Help needed moving from lying on your back to sitting on the side of a flat bed without using bedrails?: A Little Help needed moving to and from a bed to a chair (including a wheelchair)?: A Little Help needed standing up from a chair using your arms (e.g., wheelchair or bedside chair)?: A Little Help needed to walk in hospital room?: A Lot Help needed climbing 3-5 steps with a railing? : Total 6 Click Score: 15    End of Session   Activity Tolerance: Patient tolerated treatment well;Patient limited by fatigue Patient left: in chair;with chair alarm set;with family/visitor present Nurse Communication: Mobility status PT Visit Diagnosis: Muscle weakness (generalized) (M62.81);Difficulty in walking, not elsewhere classified (R26.2)     Time: 9528-4132 PT Time Calculation (min) (ACUTE ONLY): 24 min  Charges:  $Gait Training: 8-22 mins $Therapeutic Exercise: 8-22 mins                     Greggory Stallion, PT, DPT, GCS [phone]    Emmalyn Hinson 02/18/2022, 4:16 PM

## 2022-02-18 NOTE — Progress Notes (Signed)
Inpatient Diabetes Program Recommendations  AACE/ADA: New Consensus Statement on Inpatient Glycemic Control (2015)  Target Ranges:  Prepandial:   less than 140 mg/dL      Peak postprandial:   less than 180 mg/dL (1-2 hours)      Critically ill patients:  140 - 180 mg/dL   Lab Results  Component Value Date   GLUCAP 292 (H) 02/18/2022    Review of Glycemic Control  Latest Reference Range & Units 02/17/22 11:46 02/17/22 17:55 02/18/22 00:15 02/18/22 05:56  Glucose-Capillary 70 - 99 mg/dL 216 (H) 256 (H) 267 (H) 292 (H)   Outpatient Diabetes medications: none Current orders for Inpatient glycemic control: Novolog 0-9 units Q4H TPN   Inpatient Diabetes Program Recommendations:    Consider adding Levemir 8 units BID. Secure chat sent to MD, PA and RPH.   Thanks, Bronson Curb, MSN, RNC-OB Diabetes Coordinator [phone] (8a-5p)

## 2022-02-18 NOTE — TOC CM/SW Note (Signed)
CSW following for discharge needs. Notified [employer] representative of potential need for charity wound vac. Therapy currently recommending SNF but patient does not have insurance.  Dayton Scrape, [place]

## 2022-02-18 NOTE — Progress Notes (Signed)
Wound Vac Change Documentation Midline wound vac changed with grey foam, seal obtained, and set to -125 mmHg suction. Patient tolerated this well without issues.   Wound measurements Height: 12 cm Width: 7 cm Depth: 3 cm  Total: 84 cm2   Midline Wound (02/18/2022):     Plan: Will plan on change Friday 06/16 to get on MWF schedule. Discussed with WOC who has graciously agreed to assist with changes.   -- [name], PA-C [facility] 02/18/2022, 8:31 AM M-F: 7am - 4pm

## 2022-02-18 NOTE — Progress Notes (Signed)
Around 2am pt daughter called out and stated pt felt like she needed to throw up, nurse brought in IV zofran to help manage symptoms, while in the room but had 379m of green bile emesis measured, this writer called the on call provider and orders were given to place a NG tube, stat KUB was ordered and pt was placed back on a NPO diet order, will continue to monitor.

## 2022-02-19 DIAGNOSIS — K658 Other peritonitis: Secondary | ICD-10-CM

## 2022-02-19 DIAGNOSIS — T8143XA Infection following a procedure, organ and space surgical site, initial encounter: Secondary | ICD-10-CM

## 2022-02-19 LAB — BASIC METABOLIC PANEL
Anion gap: 4 — ABNORMAL LOW (ref 5–15)
BUN: 17 mg/dL (ref 6–20)
CO2: 23 mmol/L (ref 22–32)
Calcium: 8 mg/dL — ABNORMAL LOW (ref 8.9–10.3)
Chloride: 107 mmol/L (ref 98–111)
Creatinine, Ser: 0.38 mg/dL — ABNORMAL LOW (ref 0.44–1.00)
GFR, Estimated: >60 mL/min (ref >60)
Glucose, Bld: 345 mg/dL — ABNORMAL HIGH (ref 70–99)
Potassium: 4.3 mmol/L (ref 3.5–5.1)
Sodium: 134 mmol/L — ABNORMAL LOW (ref 135–145)

## 2022-02-19 LAB — GLUCOSE, CAPILLARY
Glucose-Capillary: 296 mg/dL — ABNORMAL HIGH (ref 70–99)
Glucose-Capillary: 313 mg/dL — ABNORMAL HIGH (ref 70–99)
Glucose-Capillary: 324 mg/dL — ABNORMAL HIGH (ref 70–99)
Glucose-Capillary: 328 mg/dL — ABNORMAL HIGH (ref 70–99)
Glucose-Capillary: 338 mg/dL — ABNORMAL HIGH (ref 70–99)
Glucose-Capillary: 340 mg/dL — ABNORMAL HIGH (ref 70–99)

## 2022-02-19 LAB — PREPARE PLATELET PHERESIS: Unit division: 0

## 2022-02-19 LAB — BPAM PLATELET PHERESIS
Blood Product Expiration Date: 202306102359
ISSUE DATE / TIME: 202306081101
Unit Type and Rh: 5100

## 2022-02-19 LAB — CBC
HCT: 27.8 % — ABNORMAL LOW (ref 36.0–46.0)
Hemoglobin: 9.5 g/dL — ABNORMAL LOW (ref 12.0–15.0)
MCH: 29.3 pg (ref 26.0–34.0)
MCHC: 34.2 g/dL (ref 30.0–36.0)
MCV: 85.8 fL (ref 80.0–100.0)
Platelets: 129 10*3/uL — ABNORMAL LOW (ref 150–400)
RBC: 3.24 MIL/uL — ABNORMAL LOW (ref 3.87–5.11)
RDW: 14.6 % (ref 11.5–15.5)
WBC: 11.8 10*3/uL — ABNORMAL HIGH (ref 4.0–10.5)
nRBC: 0.9 % — ABNORMAL HIGH (ref 0.0–0.2)

## 2022-02-19 LAB — MAGNESIUM: Magnesium: 2.2 mg/dL (ref 1.7–2.4)

## 2022-02-19 LAB — PHOSPHORUS: Phosphorus: 2.6 mg/dL (ref 2.5–4.6)

## 2022-02-19 MED ORDER — VANCOMYCIN HCL IN DEXTROSE 1-5 GM/200ML-% IV SOLN
1000.0000 mg | INTRAVENOUS | Status: DC
  Administered 2022-02-20: 1000 mg via INTRAVENOUS
  Filled 2022-02-19 (×3): qty 200

## 2022-02-19 MED ORDER — TRACE MINERALS CU-MN-SE-ZN 300-55-60-3000 MCG/ML IV SOLN
INTRAVENOUS | Status: AC
  Filled 2022-02-19: qty 733.2

## 2022-02-19 MED ORDER — FLUCONAZOLE IN SODIUM CHLORIDE 400-0.9 MG/200ML-% IV SOLN
400.0000 mg | INTRAVENOUS | Status: AC
  Administered 2022-02-19 – 2022-03-04 (×14): 400 mg via INTRAVENOUS
  Filled 2022-02-19 (×14): qty 200

## 2022-02-19 MED ORDER — VANCOMYCIN HCL 2000 MG/400ML IV SOLN
2000.0000 mg | Freq: Once | INTRAVENOUS | Status: AC
  Administered 2022-02-19: 2000 mg via INTRAVENOUS
  Filled 2022-02-19: qty 400

## 2022-02-19 NOTE — Progress Notes (Signed)
PHARMACY - TOTAL PARENTERAL NUTRITION CONSULT NOTE   Indication: Prolonged ileus  Patient Measurements: Height: '4\' 11"'$  (149.9 cm) Weight: 89.4 kg (197 lb) IBW/kg (Calculated) : 43.2 TPN AdjBW (KG): 54.7 Body mass index is 39.79 kg/m.  Assessment:  56 y.o. female  s/p laparotomy, excision of greater omental mass, abdominal wall reconstruction with [name],appendectomy, and placement of Prevena vac.  Glucose / Insulin: no insulin requirements prior to TPN --insulin requirements previous 24h 31 units in addition to Levemir 8 units bid (BG 279-324) Electrolytes: hypophosphatemia Renal: Scr 0.42>1.29>0.52>0.46>0.38 Hepatic: LFTs wnl Intake / Output; MIVF: 0.9 %  sodium chloride infusion at 25 mL/hr GI Imaging: GI Surgeries / Procedures: s/p laparotomy, excision of greater omental mass, abdominal wall reconstruction with [name],appendectomy, and placement of Prevena vac  Central access: 02/15/22 TPN start date: 02/15/22  Nutritional Goals: Goal TPN rate is 75 mL/hr (provides 110 g of protein and 2000 kcals per day)  RD Assessment:  Estimated Needs Total Energy Estimated Needs: 1800-2000 kcal/d Total Protein Estimated Needs: 90-110g/d Total Fluid Estimated Needs: 1.8-2.2L/d  Current Nutrition:  NPO  Plan:  Continue TPN at goal rate of 55m/hr Nutritional Components Amino acids (using 15% Clinisol): 110 grams Dextrose: 282.6 grams Lipids (using 20% SMOFlipids): 60 grams kCal: 2000/24h  Electrolytes in TPN (standard): Na 552m/L, K 5046mL, Ca 5mE65m, Mg 5mEq65m and Phos increased to 25 mmol/L. Cl:Ac 1:1 Add standard MVI and trace elements to TPN Add Insulin 20 units to TPN Continue Sensitive q6h SSI and adjust as needed, levemir increased to 12 units BID  Continue MIVF to 25 mL/hr  Monitor TPN labs on Mon/Thurs, daily until stable  Donelda Mailhot Paulina FusirmD, BCPS 02/19/2022 2:29 PM

## 2022-02-19 NOTE — Consult Note (Signed)
NAME: Debra [name]  DOB: 1965-09-26  MRN: [id number]  Date/Time: 02/19/2022 12:59 PM  REQUESTING PROVIDER: Dr.PAbon Subjective:  REASON FOR CONSULT: post op fever ? Debra [name] is a 56 y.o. 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 Underwent laparotomy and excsion of the pelvic mass from greater omentum on 02/13/22 with abdominal wall reconstruction, recurrent incisional hernia repair, wound vac placement As she had significant increased poutput in th wound vac , color being brown concern for bowel perforation and she was taken back for surgery on 02/15/22 and the small bowel perforation ileum with enteric contents intra abdominal cavity was noted. It was repaired. Pt has been on zosyn since 02/15/22 Pt spiked a temp of 103 on 02/17/22 and blood cultures sent . TC abdomen done on 6/13 showed no collection intraabdominally. UA 0-5 wbc, CT chest no infitrate and fluconazole and vancomycin added today I am asked to see  patient for fever Pt is very tired and does not want to use the interpretor video service She sgned that the NG tube is bothering her No cough, no sob, no pain abdomen    Past Medical History:  Diagnosis Date   Anemia    Colon cancer ([place]) 04/2020   History of kidney stones    Hypothyroidism    Thyroid disease     Past Surgical History:  Procedure Laterality Date   ABDOMINAL WALL DEFECT REPAIR N/A 02/13/2022   Procedure: REPAIR ABDOMINAL WALL;  Surgeon: Jules Husbands, MD;  Location: [facility];  Service: General;  Laterality: N/A;   APPENDECTOMY N/A 02/13/2022   Procedure: APPENDECTOMY;  Surgeon: Jules Husbands, MD;  Location: [facility];  Service: General;  Laterality: N/A;   APPLICATION OF WOUND VAC N/A 02/15/2022   Procedure:  APPLICATION OF WOUND VAC;  Surgeon: Jules Husbands, MD;  Location: [facility];  Service: General;  Laterality: N/A;  [id number]   COLON RESECTION SIGMOID N/A 02/13/2022   Procedure: COLON RESECTION SIGMOID, open, [name], PA-C to assist;  Surgeon: Jules Husbands, MD;  Location: [facility];  Service: General;  Laterality: N/A;   COLONOSCOPY WITH PROPOFOL N/A 11/21/2019   Procedure: COLONOSCOPY WITH PROPOFOL;  Surgeon: Lin Landsman, MD;  Location: [facility];  Service: Gastroenterology;  Laterality: N/A;   COLONOSCOPY WITH PROPOFOL N/A 07/10/2021   Procedure: COLONOSCOPY WITH PROPOFOL;  Surgeon: Lin Landsman, MD;  Location: [facility];  Service: Gastroenterology;  Laterality: N/A;   COLONOSCOPY WITH PROPOFOL N/A 02/06/2022   Procedure: COLONOSCOPY WITH PROPOFOL;  Surgeon: Lin Landsman, MD;  Location: [facility] ENDOSCOPY;  Service: Gastroenterology;  Laterality: N/A;  SPANISH   CYSTOSCOPY WITH STENT PLACEMENT Left 01/30/2020   Procedure: CYSTOSCOPY WITH STENT PLACEMENT;  Surgeon: Hollice Espy, MD;  Location: [facility];  Service: Urology;  Laterality: Left;   ESOPHAGOGASTRODUODENOSCOPY (EGD) WITH PROPOFOL N/A 11/21/2019   Procedure: ESOPHAGOGASTRODUODENOSCOPY (EGD) WITH PROPOFOL;  Surgeon: Lin Landsman, MD;  Location: [facility] ENDOSCOPY;  Service: Gastroenterology;  Laterality: N/A;   INSERTION OF MESH N/A 02/13/2022   Procedure: INSERTION OF MESH;  Surgeon: Jules Husbands, MD;  Location: [facility];  Service: General;  Laterality: N/A;   INSERTION OF MESH N/A 02/15/2022   Procedure: INSERTION OF MESH;  Surgeon: Jules Husbands, MD;  Location: [facility];  Service:  General;  Laterality: N/A;   IR RADIOLOGIST EVAL & MGMT  07/03/2020   IR RADIOLOGIST EVAL & MGMT  07/12/2020   LAPAROTOMY N/A 02/15/2022   Procedure: EXPLORATORY LAPAROTOMY REPAIR OF BOWEL PERFORATION;  Surgeon: Jules Husbands, MD;  Location: [facility];  Service: General;  Laterality: N/A;   PARTIAL COLECTOMY N/A 01/30/2020    Procedure: PARTIAL COLECTOMY Sigmoid;  Surgeon: [name], MD;  Location: [facility];  Service: General;  Laterality: N/A;   PORTACATH PLACEMENT Left 03/28/2020   Procedure: INSERTION PORT-A-CATH;  Surgeon: [name], MD;  Location: [facility];  Service: General;  Laterality: Left;   PORTACATH PLACEMENT N/A 08/29/2020   Procedure: INSERTION PORT-A-CATH, chemotherapy;  Surgeon: [name], MD;  Location: [facility];  Service: General;  Laterality: N/A;   VENTRAL HERNIA REPAIR N/A 02/13/2022   Procedure: HERNIA REPAIR VENTRAL ADULT, open, [name], PA-C to assist;  Surgeon: Jules Husbands, MD;  Location: [facility];  Service: General;  Laterality: N/A;    Social History   Socioeconomic History   Marital status: Significant Other    Spouse name: Not on file   Number of children: Not on file   Years of education: Not on file   Highest education level: Not on file  Occupational History   Occupation: Factory  Tobacco Use   Smoking status: Never   Smokeless tobacco: Never  Vaping Use   Vaping Use: Never used  Substance and Sexual Activity   Alcohol use: Never   Drug use: Never   Sexual activity: Not on file  Other Topics Concern   Not on file  Social History Narrative   Lives at home with significant other   Social Determinants of Health   Financial Resource Strain: Not on file  Food Insecurity: Not on file  Transportation Needs: Not on file  Physical Activity: Not on file  Stress: Not on file  Social Connections: Not on file  Intimate Partner Violence: Not on file    Family History  Problem Relation Age of Onset   Diabetes Sister    No Known Allergies I? Current Facility-Administered Medications  Medication Dose Route Frequency Provider Last Rate Last Admin   0.9 %  sodium chloride infusion   Intravenous Continuous Dallie Piles, RPH   Stopped at 02/18/22 0600   acetaminophen (TYLENOL) tablet 650 mg  650 mg Oral Q6H PRN Sakai, Isami, DO   650 mg at  02/18/22 0557   Chlorhexidine Gluconate Cloth 2 % PADS 6 each  6 each Topical Daily Pabon, [place] F, MD   6 each at 02/19/22 0933   diphenhydrAMINE (BENADRYL) 12.5 MG/5ML elixir 12.5 mg  12.5 mg Oral Q6H PRN Pabon, Diego F, MD       Or   diphenhydrAMINE (BENADRYL) injection 12.5 mg  12.5 mg Intravenous Q6H PRN Pabon, Diego F, MD       fluconazole (DIFLUCAN) IVPB 400 mg  400 mg Intravenous Q24H Darnelle Bos, RPH 100 mL/hr at 02/19/22 1218 400 mg at 02/19/22 1218   insulin aspart (novoLOG) injection 0-9 Units  0-9 Units Subcutaneous Q6H Dallie Piles, RPH   7 Units at 02/19/22 1216   insulin detemir (LEVEMIR) injection 8 Units  8 Units Subcutaneous BID Tylene Fantasia, PA-C   8 Units at 02/19/22 0930   iohexol (OMNIPAQUE) 9 MG/ML oral solution 500 mL  500 mL Per Tube Once PRN Rito Ehrlich A, RPH   500 mL at 02/18/22 1100   levothyroxine (SYNTHROID) tablet 137  mcg  137 mcg Oral QAC breakfast Pabon, [place] F, MD   137 mcg at 02/16/22 0536   methocarbamol (ROBAXIN) tablet 500 mg  500 mg Oral Q8H PRN Pabon, Diego F, MD       Or   methocarbamol (ROBAXIN) 500 mg in dextrose 5 % 50 mL IVPB  500 mg Intravenous Q8H PRN Pabon, Diego F, MD       morphine (PF) 2 MG/ML injection 2 mg  2 mg Intravenous Q3H PRN Pabon, Diego F, MD   2 mg at 02/18/22 0852   ondansetron (ZOFRAN-ODT) disintegrating tablet 4 mg  4 mg Oral Q6H PRN Pabon, Diego F, MD       Or   ondansetron (ZOFRAN) injection 4 mg  4 mg Intravenous Q6H PRN Pabon, Diego F, MD   4 mg at 02/18/22 0155   oxyCODONE (Oxy IR/ROXICODONE) immediate release tablet 5-10 mg  5-10 mg Oral Q4H PRN Caroleen Hamman F, MD   10 mg at 02/14/22 0926   pantoprazole (PROTONIX) injection 40 mg  40 mg Intravenous Q12H Pabon, Diego F, MD   40 mg at 02/19/22 0931   phenol (CHLORASEPTIC) mouth spray 1 spray  1 spray Mouth/Throat PRN Pabon, Diego F, MD       piperacillin-tazobactam (ZOSYN) IVPB 3.375 g  3.375 g Intravenous Q8H Pabon, Diego F, MD 12.5 mL/hr at 02/19/22 0617 3.375  g at 02/19/22 0617   prochlorperazine (COMPAZINE) tablet 10 mg  10 mg Oral Q6H PRN Pabon, Diego F, MD       Or   prochlorperazine (COMPAZINE) injection 5-10 mg  5-10 mg Intravenous Q6H PRN Pabon, Diego F, MD       sodium chloride flush (NS) 0.9 % injection 10-40 mL  10-40 mL Intracatheter Q12H Pabon, Diego F, MD   10 mL at 02/19/22 0933   sodium chloride flush (NS) 0.9 % injection 10-40 mL  10-40 mL Intracatheter PRN Pabon, Diego F, MD       sodium chloride flush (NS) 0.9 % injection 10-40 mL  10-40 mL Intracatheter Q12H Pabon, [place], MD   10 mL at 02/19/22 0933   sodium chloride flush (NS) 0.9 % injection 10-40 mL  10-40 mL Intracatheter PRN Pabon, Marjory Lies, MD       TPN ADULT (ION)   Intravenous Continuous TPN Darnelle Bos, RPH 75 mL/hr at 02/19/22 0606 Infusion Verify at 02/19/22 0606   TPN ADULT (ION)   Intravenous Continuous TPN Vira Blanco, RPH       vancomycin (VANCOREADY) IVPB 2000 mg/400 mL  2,000 mg Intravenous Once Darnelle Bos, RPH         Abtx:  Anti-infectives (From admission, onward)    Start     Dose/Rate Route Frequency Ordered Stop   02/19/22 1115  vancomycin (VANCOREADY) IVPB 2000 mg/400 mL        2,000 mg 200 mL/hr over 120 Minutes Intravenous  Once 02/19/22 1025     02/19/22 1115  fluconazole (DIFLUCAN) IVPB 400 mg        400 mg 100 mL/hr over 120 Minutes Intravenous Every 24 hours 02/19/22 1025     02/15/22 0730  piperacillin-tazobactam (ZOSYN) IVPB 3.375 g        3.375 g 12.5 mL/hr over 240 Minutes Intravenous Every 8 hours 02/15/22 0639     02/15/22 0655  piperacillin-tazobactam (ZOSYN) 3.375 GM/50ML IVPB       Note to Pharmacy: Glenford Peers N: cabinet override  02/15/22 0655 02/15/22 0816   02/13/22 2200  cefoTEtan (CEFOTAN) 2 g in sodium chloride 0.9 % 100 mL IVPB  Status:  Discontinued        2 g 200 mL/hr over 30 Minutes Intravenous Every 12 hours 02/13/22 1541 02/15/22 0639   02/13/22 1345  cefoTEtan (CEFOTAN) 2 g in sodium chloride 0.9 %  100 mL IVPB        2 g 200 mL/hr over 30 Minutes Intravenous  Once 02/13/22 1331 02/13/22 2146   02/13/22 1336  sodium chloride 0.9 % with cefoTEtan (CEFOTAN) ADS Med       Note to Pharmacy: Rulon Sera: cabinet override      02/13/22 1336 02/14/22 0144   02/13/22 0620  sodium chloride 0.9 % with cefoTEtan (CEFOTAN) ADS Med       Note to Pharmacy: Norton Blizzard A: cabinet override      02/13/22 0620 02/13/22 0802   02/13/22 0600  cefoTEtan (CEFOTAN) 2 g in sodium chloride 0.9 % 100 mL IVPB        2 g 200 mL/hr over 30 Minutes Intravenous On call to O.R. 02/12/22 2229 02/13/22 2146       REVIEW OF SYSTEMS:  Const:  fever, negative chills, negative weight loss Eyes: negative diplopia or visual changes, negative eye pain ENT: negative coryza, negative sore throat Resp: negative cough, hemoptysis, dyspnea Cards: negative for chest pain, palpitations, lower extremity edema GU: negative for frequency, dysuria and hematuria GI: some abdominal pain, no diarrhea, bleeding, constipation Skin: negative for rash and pruritus Heme: negative for easy bruising and gum/nose bleeding MS: general weakness Neurolo:negative for headaches, dizziness, vertigo, memory problems  Psych: negative for feelings of anxiety, depression  Endocrine:  thyroid,  Allergy/Immunology- negative for any medication or food allergies ?  Objective:  VITALS:  BP 128/77 (BP Location: Right Arm)   Pulse 100   Temp 98.4 F (36.9 C)   Resp 18   Ht '4\' 11"'$  (1.499 m)   Wt 89.4 kg   SpO2 96%   BMI 39.79 kg/m  LDA Foley PORT Midline NG tube JP drain PHYSICAL EXAM:  General: awake, but ill, .  Head: Normocephalic, without obvious abnormality, atraumatic. Eyes: Conjunctivae clear, anicteric sclerae. Pupils are equal ENT NG tube - bilious fluid Lips, mucosa, and tongue normal. No Thrush Neck: symmetrical, no adenopathy, thyroid: non tender no carotid bruit and no JVD. Lungs: b/l air entry- decreased  bases. Heart: TAchycardia Abdomen: surgical wound covered with wound vac JP drain left side has purulent fluid Bowel sounds not present Extremities: atraumatic, no cyanosis. No edema. No clubbing Skin: No rashes or lesions. Or bruising Lymph: Cervical, supraclavicular normal. Neurologic: Grossly non-focal Pertinent Labs Lab Results CBC    Component Value Date/Time   WBC 11.8 (H) 02/19/2022 0554   RBC 3.24 (L) 02/19/2022 0554   HGB 9.5 (L) 02/19/2022 0554   HCT 27.8 (L) 02/19/2022 0554   PLT 129 (L) 02/19/2022 0554   MCV 85.8 02/19/2022 0554   MCH 29.3 02/19/2022 0554   MCHC 34.2 02/19/2022 0554   RDW 14.6 02/19/2022 0554   LYMPHSABS 1.3 12/25/2021 1246   MONOABS 0.5 12/25/2021 1246   EOSABS 0.1 12/25/2021 1246   BASOSABS 0.0 12/25/2021 1246       Latest Ref Rng & Units 02/19/2022    5:54 AM 02/18/2022    6:03 AM 02/17/2022    5:00 AM  CMP  Glucose 70 - 99 mg/dL 345  283  186   BUN  6 - 20 mg/dL '17  15  14   '$ Creatinine 0.44 - 1.00 mg/dL 0.38  0.46  0.52   Sodium 135 - 145 mmol/L 134  137  140   Potassium 3.5 - 5.1 mmol/L 4.3  3.7  3.4   Chloride 98 - 111 mmol/L 107  109  113   CO2 22 - 32 mmol/L '23  23  23   '$ Calcium 8.9 - 10.3 mg/dL 8.0  7.9  7.8   Total Protein 6.5 - 8.1 g/dL   5.1   Total Bilirubin 0.3 - 1.2 mg/dL   0.7   Alkaline Phos 38 - 126 U/L   51   AST 15 - 41 U/L   15   ALT 0 - 44 U/L   10       Microbiology: Recent Results (from the past 240 hour(s))  Culture, blood (Routine X 2) w Reflex to ID Panel     Status: None (Preliminary result)   Collection Time: 02/17/22 10:02 PM   Specimen: BLOOD  Result Value Ref Range Status   Specimen Description BLOOD RIGHT FOREARM  Final   Special Requests   Final    BOTTLES DRAWN AEROBIC AND ANAEROBIC Blood Culture results may not be optimal due to an excessive volume of blood received in culture bottles   Culture   Final    NO GROWTH 2 DAYS Performed at [facility], [address] [place], [place]  [address]    Report Status PENDING  Incomplete  Culture, blood (Routine X 2) w Reflex to ID Panel     Status: None (Preliminary result)   Collection Time: 02/17/22 10:02 PM   Specimen: BLOOD  Result Value Ref Range Status   Specimen Description BLOOD RIGHT HAND  Final   Special Requests   Final    IN PEDIATRIC BOTTLE Blood Culture results may not be optimal due to an excessive volume of blood received in culture bottles   Culture   Final    NO GROWTH 2 DAYS Performed at [facility], [address], [place], [place] [address]    Report Status PENDING  Incomplete  Culture, blood (Routine X 2) w Reflex to ID Panel     Status: None (Preliminary result)   Collection Time: 02/18/22 11:05 AM   Specimen: BLOOD  Result Value Ref Range Status   Specimen Description BLOOD RIGHT ANTECUBITAL  Final   Special Requests   Final    BOTTLES DRAWN AEROBIC AND ANAEROBIC Blood Culture adequate volume   Culture   Final    NO GROWTH < 24 HOURS Performed at [facility], [address] [place], [place] [address]    Report Status PENDING  Incomplete  Culture, blood (Routine X 2) w Reflex to ID Panel     Status: None (Preliminary result)   Collection Time: 02/18/22 11:30 AM   Specimen: BLOOD  Result Value Ref Range Status   Specimen Description BLOOD BLOOD RIGHT ARM  Final   Special Requests   Final    BOTTLES DRAWN AEROBIC AND ANAEROBIC Blood Culture adequate volume   Culture   Final    NO GROWTH < 24 HOURS Performed at [facility], [address] [place], [place] [address]    Report Status PENDING  Incomplete    IMAGING RESULTS: I have personally reviewed the films ? Impression/Recommendation ? ?fever secondary to intraabdominal source-SB perforation - had fecal spillage and could have lead to peritoniits. -purulent fluid in JP  drain- please send for culture  agree with adding fluconazole for anti candida coverage , already on zosyn- okay to add vanco for  now- Doubt PORT is the soiurce currently No UTI or pneumonia  Open abdominal wall surgical wound- covered with vac  Recent omentall mass resection -6/8  H/o colan ca s/p left hemicolectomy May 0712 and complicated by intra- abdominal abscess, leak S/p chemo  Anemia ? ___________________________________________________ Discussed with care team Note:  This document was prepared using Dragon voice recognition software and may include unintentional dictation errors.

## 2022-02-19 NOTE — Inpatient Diabetes Management (Addendum)
Inpatient Diabetes Program Recommendations  AACE/ADA: New Consensus Statement on Inpatient Glycemic Control (2015)  Target Ranges:  Prepandial:   less than 140 mg/dL      Peak postprandial:   less than 180 mg/dL (1-2 hours)      Critically ill patients:  140 - 180 mg/dL   Lab Results  Component Value Date   GLUCAP 324 (H) 02/19/2022    Review of Glycemic Control  Latest Reference Range & Units 02/18/22 05:56 02/18/22 12:12 02/18/22 17:28 02/18/22 19:56 02/19/22 00:21 02/19/22 06:01  Glucose-Capillary 70 - 99 mg/dL 292 (H) 279 (H) 301 (H) 295 (H) 328 (H) 324 (H)  (H): Data is abnormally high  Current orders for Inpatient glycemic control: Levemir 8 units BID, Novolog 0-9 units Q6H  Inpatient Diabetes Program Recommendations:    Levemir 12 units BID  Addendum'@1034'$ :  Reached out to [place], PharmD; she is adding insulin to TPN this evening.    Will continue to follow while inpatient.  Thank you, Reche Dixon, MSN, [place] [facility] Coordinator Inpatient Diabetes Program [phone] (team pager from 8a-5p)

## 2022-02-19 NOTE — Progress Notes (Signed)
  Progress Note   Date: 02/18/2022  Patient Name: Debra [name]        MRN#: [id number]  Review of the patient's clinical findings supports the diagnosis of  Acute kidney injury

## 2022-02-19 NOTE — Progress Notes (Signed)
   02/17/22 2114  Assess: MEWS Score  Temp (!) 103.1 F (39.5 C)  BP 131/76  MAP (mmHg) 94  Pulse Rate (!) 118  SpO2 93 %  O2 Device Room Air  Assess: MEWS Score  MEWS Temp 2  MEWS Systolic 0  MEWS Pulse 2  MEWS RR 0  MEWS LOC 0  MEWS Score 4  MEWS Score Color Red  Assess: if the MEWS score is Yellow or Red  Were vital signs taken at a resting state? Yes  Focused Assessment Change from prior assessment (see assessment flowsheet)  Does the patient meet 2 or more of the SIRS criteria? Yes  Does the patient have a confirmed or suspected source of infection? No  MEWS guidelines implemented *See Row Information* Yes  Treat  MEWS Interventions Administered prn meds/treatments  Take Vital Signs  Increase Vital Sign Frequency  Red: Q 1hr X 4 then Q 4hr X 4, if remains red, continue Q 4hrs  Escalate  MEWS: Escalate Red: discuss with charge nurse/RN and provider, consider discussing with RRT  Notify: Charge Nurse/RN  Name of Charge Nurse/RN Notified [place]  Date Charge Nurse/RN Notified 02/17/22  Time Charge Nurse/RN Notified 2010  Notify: Provider  Provider Name/Title Lysle Pearl  Date Provider Notified 02/17/22  Time Provider Notified 2200  Method of Notification Call  Notification Reason Change in status  Provider response See new orders  Date of Provider Response 02/17/22  Time of Provider Response 2200  Document  Patient Outcome Stabilized after interventions  Progress note created (see row info) Yes  Assess: SIRS CRITERIA  SIRS Temperature  1  SIRS Pulse 1  SIRS Respirations  0  SIRS WBC 0  SIRS Score Sum  2

## 2022-02-19 NOTE — Progress Notes (Signed)
[facility] Day(s): 6.   Post op day(s): 4 Days Post-Op.   Interval History:  Patient seen and examined No fevers recorded overnight but note from 0726 notes fever to 103.30F. She had extensive work up with CT C/A/P, UA, and venous US which without obvious source She does have a leukocytosis this morning to 11.8K Hgb to 9.5 Renal function normal; sCr - 0.38; UO - 1100 ccs NGT with 1575 ccs out in last 24 hours Surgical Drains:   - Left drain (Retro Rectus) 110 ccs; serosanguinous  - Right Drain (Intraperitoneal) 145 ccs; serous Midline Wound vac; output 150 ccs; serosanguinous She is NPO; on TPN  Pathology reviewed:  -- Metastatic adenocarcinoma; colon origin -- Plan for output follow up with oncology (Dr Tasia Catchings)  Vital signs in last 24 hours: [min-max] current  Temp:  [98 F (36.7 C)-99.9 F (37.7 C)] 98 F (36.7 C) (06/14 0516) Pulse Rate:  [95-110] 95 (06/14 0516) Resp:  [18] 18 (06/14 0516) BP: (117-130)/(70-84) 117/77 (06/14 0516) SpO2:  [94 %-100 %] 100 % (06/14 0516)     Height: '4\' 11"'$  (149.9 cm) Weight: 89.4 kg BMI (Calculated): 39.77   Intake/Output last 2 shifts:  06/13 0701 - 06/14 0700 In: 1928.7 [I.V.:1784.6; IV Piggyback:144.1] Out: 3080 [Urine:1100; Emesis/NG output:1575; Drains:405]   Physical Exam:  Constitutional: alert, cooperative and no distress  HEENT: NGT in place; output bilious  Respiratory: breathing non-labored at rest  Cardiovascular: regular rate and sinus rhythm  Gastrointestinal: Soft, incisional soreness more in the upper abdomen, non-distended, no rebound/guarding. Surgical drain in LLQ; serosanguinous and RLQ; serous Genitourinary: Foley in place  Integumentary: Midline wound with wound vac; will change later today and document separately   Labs:     Latest Ref Rng & Units 02/19/2022    5:54 AM 02/18/2022    7:24 AM 02/17/2022    5:00 AM  CBC  WBC 4.0 - 10.5 K/uL 11.8  7.0  4.3    Hemoglobin 12.0 - 15.0 g/dL 9.5  10.5  8.2   Hematocrit 36.0 - 46.0 % 27.8  30.6  24.2   Platelets 150 - 400 K/uL 129  114  75       Latest Ref Rng & Units 02/19/2022    5:54 AM 02/18/2022    6:03 AM 02/17/2022    5:00 AM  CMP  Glucose 70 - 99 mg/dL 345  283  186   BUN 6 - 20 mg/dL '17  15  14   '$ Creatinine 0.44 - 1.00 mg/dL 0.38  0.46  0.52   Sodium 135 - 145 mmol/L 134  137  140   Potassium 3.5 - 5.1 mmol/L 4.3  3.7  3.4   Chloride 98 - 111 mmol/L 107  109  113   CO2 22 - 32 mmol/L '23  23  23   '$ Calcium 8.9 - 10.3 mg/dL 8.0  7.9  7.8   Total Protein 6.5 - 8.1 g/dL   5.1   Total Bilirubin 0.3 - 1.2 mg/dL   0.7   Alkaline Phos 38 - 126 U/L   51   AST 15 - 41 U/L   15   ALT 0 - 44 U/L   10      Imaging studies:  Venous US reviewed overnight; no DVT   Assessment/Plan: 56 y.o. female now with fevers and tachycardia  4 Days Post-Op s/p re-opening of laparotomy for repair of small bowel perforation and 4 days s/p laparotomy,  excision of greater omental mass, abdominal wall reconstruction with [name],appendectomy, and placement of Prevena vac.   - Will add vancomycin and diflucan to regimen  - May need port removed and PICC placed  - Monitor fever curve  - Continue NGT to LIS: monitor and record output  - Continue TPN; at goal rate; replete electrolytes and monitor; d/w pharmacy and greatly appreciate their assistance - Wound vac change today; Will ask for [place] RN assistance starting Friday 06/16 to get her on MWF schedule  - Will continue foley today given acute changes overnight; good UO   - Continue surgical drains; monitor and record output  - Monitor abdominal examination; on-going bowel function  - Pain control prn; antemetic prn  - Out of bed; therapies on board; recommendations are HHPT  All of the above findings and recommendations were discussed with the patient, and the medical team, and all of patient's questions were answered to her expressed  satisfaction.  -- [name], PA-C [facility] 02/19/2022, 7:39 AM M-F: 7am - 4pm

## 2022-02-19 NOTE — Progress Notes (Signed)
Physical Therapy Treatment Patient Details Name: Debra [name] MRN: [id number] DOB: 1966-07-09 Today's Date: 02/19/2022   History of Present Illness Pt is a 56 y.o. female with PMH including hypothyroidism, iron deficient anemia, colon cancer.  Here s/p mass removal with hernia repair and appendectomy 6/8, bowel perforation repair 6/10.    PT Comments    Pt lying in bed at start of session with report that she was sitting up in the recliner 2x today. Medical interpreter Debra [name] was utilized during session. Pt began supine ther-ex when she became nauseous and vomited. Pt sat up at the EOB with min A via hand hold and truncal support when pt had to vomit again. Pt requested to remain seated at EOB due to nausea/vomiting. Pt was left with daughter and educated to use call bell when pt would like to lie back down in bed. PT communicated with nursing pts current status. Continue to recommend SNF at discharge.   Recommendations for follow up therapy are one component of a multi-disciplinary discharge planning process, led by the attending physician.  Recommendations may be updated based on patient status, additional functional criteria and insurance authorization.  Follow Up Recommendations  Skilled nursing-short term rehab (<3 hours/day)     Assistance Recommended at Discharge Frequent or constant Supervision/Assistance  Patient can return home with the following Help with stairs or ramp for entrance;Assist for transportation;Assistance with cooking/housework;A lot of help with walking and/or transfers;A lot of help with bathing/dressing/bathroom   Equipment Recommendations  Rolling walker (2 wheels);Hospital bed    Recommendations for Other Services       Precautions / Restrictions Precautions Precautions: Fall Restrictions Weight Bearing Restrictions: No Other Position/Activity Restrictions: Abdominal surgery     Mobility  Bed Mobility Overal bed mobility: Needs  Assistance Bed Mobility: Supine to Sit     Supine to sit: Min assist     General bed mobility comments: pt used hand hold and bed rail to transfer from supine to sit EOB    Transfers                   General transfer comment: NT due to pt nausea/vomiting during session    Ambulation/Gait               General Gait Details: NT due to pt nausea/vomiting during session   Stairs             Wheelchair Mobility    Modified Rankin (Stroke Patients Only)       Balance Overall balance assessment: Needs assistance Sitting-balance support: Bilateral upper extremity supported, Feet supported Sitting balance-Leahy Scale: Fair Sitting balance - Comments: Pt sat EOB without LOB with 1 UE supported                                    Cognition Arousal/Alertness: Awake/alert Behavior During Therapy: WFL for tasks assessed/performed Overall Cognitive Status: Within Functional Limits for tasks assessed                                 General Comments: pt agreeable to therapy, however not verbally expressive during session        Exercises Other Exercises Other Exercises: Pt completed bilat AP and Heel Slides x10 in supine prior to vomiting.    General Comments        Pertinent Vitals/Pain  Pain Assessment Pain Assessment: Faces Faces Pain Scale: Hurts even more Pain Location: adbomen Pain Descriptors / Indicators: Operative site guarding Pain Intervention(s): Limited activity within patient's tolerance    Home Living                          Prior Function            PT Goals (current goals can now be found in the care plan section) Acute Rehab PT Goals Patient Stated Goal: to go home PT Goal Formulation: With patient/family Time For Goal Achievement: 03/01/22 Potential to Achieve Goals: Fair Progress towards PT goals: Progressing toward goals    Frequency    Min 2X/week      PT Plan Current  plan remains appropriate    Co-evaluation              AM-PAC PT "6 Clicks" Mobility   Outcome Measure  Help needed turning from your back to your side while in a flat bed without using bedrails?: A Little Help needed moving from lying on your back to sitting on the side of a flat bed without using bedrails?: A Little Help needed moving to and from a bed to a chair (including a wheelchair)?: A Little Help needed standing up from a chair using your arms (e.g., wheelchair or bedside chair)?: A Little Help needed to walk in hospital room?: A Lot Help needed climbing 3-5 steps with a railing? : Total 6 Click Score: 15    End of Session   Activity Tolerance: Other (comment) (Pt experienced nausea and vomiting during session with limited activity) Patient left: in bed;Other (comment);with family/visitor present;with call bell/phone within reach;with bed alarm set (Pt requested to be left sitting EOB due to vomiting; daughter present in room and instructed to use call bell when pt wants to lie back down in bed.) Nurse Communication: Other (comment) (Communicated pt vomiting during session and suction cannister full in pts room, as well as how pt was left sitting EOB with daughter present.) PT Visit Diagnosis: Muscle weakness (generalized) (M62.81);Difficulty in walking, not elsewhere classified (R26.2)     Time: 3419-6222 PT Time Calculation (min) (ACUTE ONLY): 18 min  Charges:                        Rella Larve, SPT   Portia Wisdom 02/19/2022, 4:57 PM

## 2022-02-19 NOTE — Consult Note (Signed)
Pharmacy Antibiotic Note  Debra [name] is a 56 y.o. female  s/p laparotomy, excision of greater omental mass, abdominal wall reconstruction with [name],appendectomy, and placement of Prevena vac. Pt febrile to 101.1 overnight. Pharmacy has been consulted for vancomycin and fluconazole dosing for intra-abdominal infection.   Plan: Vancomycin 2000 mg IV loading dose, followed by 1000 mg IV q24h Goal AUC 400-550  Est AUC: 463 Est Cmax: 32.6 Est Cmin: 10.7 Calculated with SCr 0.8 (rounded up), Vd 0.5 (BMI 39)  Fluconazole 400 mg IV q24h  Patient is also on Zosyn 3.375 gm IV q8h (4 hour infusion) Monitor clinical picture, renal function, and vancomycin levels at steady state F/U C&S, abx deescalation / LOT    Height: '4\' 11"'$  (149.9 cm) Weight: 89.4 kg (197 lb) IBW/kg (Calculated) : 43.2  Temp (24hrs), Avg:98.8 F (37.1 C), Min:98 F (36.7 C), Max:99.9 F (37.7 C)  Recent Labs  Lab 02/14/22 0658 02/15/22 0430 02/16/22 0503 02/17/22 0500 02/18/22 0603 02/18/22 0724 02/19/22 0554  WBC 4.1 3.4*  --  4.3  --  7.0 11.8*  CREATININE 0.42* 1.29* 0.57 0.52 0.46  --  0.38*    Estimated Creatinine Clearance: 75.6 mL/min (A) (by C-G formula based on SCr of 0.38 mg/dL (L)).    No Known Allergies  Antimicrobials this admission: 6/10 Zosyn >> 6/14 Vancomycin >>  6/14 Fluconazole >>   Dose adjustments this admission:   Microbiology results: 6/12 BCx: NG x 2 days  6/13 BCx: NG <24h   Thank you for allowing pharmacy to be a part of this patient's care.  Darnelle Bos, PharmD 02/19/2022 10:15 AM

## 2022-02-20 ENCOUNTER — Encounter: Payer: Self-pay | Admitting: Oncology

## 2022-02-20 DIAGNOSIS — T8140XA Infection following a procedure, unspecified, initial encounter: Secondary | ICD-10-CM

## 2022-02-20 LAB — CBC
HCT: 26.5 % — ABNORMAL LOW (ref 36.0–46.0)
Hemoglobin: 8.9 g/dL — ABNORMAL LOW (ref 12.0–15.0)
MCH: 29 pg (ref 26.0–34.0)
MCHC: 33.6 g/dL (ref 30.0–36.0)
MCV: 86.3 fL (ref 80.0–100.0)
Platelets: 132 10*3/uL — ABNORMAL LOW (ref 150–400)
RBC: 3.07 MIL/uL — ABNORMAL LOW (ref 3.87–5.11)
RDW: 14.6 % (ref 11.5–15.5)
WBC: 13.8 10*3/uL — ABNORMAL HIGH (ref 4.0–10.5)
nRBC: 0.6 % — ABNORMAL HIGH (ref 0.0–0.2)

## 2022-02-20 LAB — COMPREHENSIVE METABOLIC PANEL
ALT: 14 U/L (ref 0–44)
AST: 22 U/L (ref 15–41)
Albumin: 2.3 g/dL — ABNORMAL LOW (ref 3.5–5.0)
Alkaline Phosphatase: 82 U/L (ref 38–126)
Anion gap: 3 — ABNORMAL LOW (ref 5–15)
BUN: 16 mg/dL (ref 6–20)
CO2: 23 mmol/L (ref 22–32)
Calcium: 8.1 mg/dL — ABNORMAL LOW (ref 8.9–10.3)
Chloride: 111 mmol/L (ref 98–111)
Creatinine, Ser: 0.4 mg/dL — ABNORMAL LOW (ref 0.44–1.00)
GFR, Estimated: >60 mL/min (ref >60)
Glucose, Bld: 304 mg/dL — ABNORMAL HIGH (ref 70–99)
Potassium: 4.2 mmol/L (ref 3.5–5.1)
Sodium: 137 mmol/L (ref 135–145)
Total Bilirubin: 0.8 mg/dL (ref 0.3–1.2)
Total Protein: 5.8 g/dL — ABNORMAL LOW (ref 6.5–8.1)

## 2022-02-20 LAB — GLUCOSE, CAPILLARY
Glucose-Capillary: 219 mg/dL — ABNORMAL HIGH (ref 70–99)
Glucose-Capillary: 219 mg/dL — ABNORMAL HIGH (ref 70–99)
Glucose-Capillary: 263 mg/dL — ABNORMAL HIGH (ref 70–99)
Glucose-Capillary: 272 mg/dL — ABNORMAL HIGH (ref 70–99)
Glucose-Capillary: 284 mg/dL — ABNORMAL HIGH (ref 70–99)

## 2022-02-20 LAB — TRIGLYCERIDES: Triglycerides: 123 mg/dL (ref <150)

## 2022-02-20 LAB — MAGNESIUM: Magnesium: 2.2 mg/dL (ref 1.7–2.4)

## 2022-02-20 LAB — PHOSPHORUS: Phosphorus: 2.4 mg/dL — ABNORMAL LOW (ref 2.5–4.6)

## 2022-02-20 MED ORDER — TRACE MINERALS CU-MN-SE-ZN 300-55-60-3000 MCG/ML IV SOLN
INTRAVENOUS | Status: AC
  Filled 2022-02-20: qty 733.2

## 2022-02-20 MED ORDER — INSULIN DETEMIR 100 UNIT/ML ~~LOC~~ SOLN
10.0000 [IU] | Freq: Two times a day (BID) | SUBCUTANEOUS | Status: DC
  Administered 2022-02-20 – 2022-02-27 (×14): 10 [IU] via SUBCUTANEOUS
  Filled 2022-02-20 (×15): qty 0.1

## 2022-02-20 NOTE — Plan of Care (Signed)

## 2022-02-20 NOTE — Progress Notes (Signed)
ID Granddaughter at bedside Patient is feeling better But is troubled with the NG tube Still has bilious fluid Has not passed gas No pain abdomen Wants to drink water  On examination awake and alert Fatigued Ill NG tube with bilious fluid Chest bilateral air entry Decreased in the bases Heart sound S1-S2 Abdomen wound VAC in place JP drain has some purulent drainage today  Labs     Latest Ref Rng & Units 02/20/2022    3:38 AM 02/19/2022    5:54 AM 02/18/2022    7:24 AM  CBC  WBC 4.0 - 10.5 K/uL 13.8  11.8  7.0   Hemoglobin 12.0 - 15.0 g/dL 8.9  9.5  10.5   Hematocrit 36.0 - 46.0 % 26.5  27.8  30.6   Platelets 150 - 400 K/uL 132  129  114         Latest Ref Rng & Units 02/20/2022    3:38 AM 02/19/2022    5:54 AM 02/18/2022    6:03 AM  CMP  Glucose 70 - 99 mg/dL 304  345  283   BUN 6 - 20 mg/dL '16  17  15   '$ Creatinine 0.44 - 1.00 mg/dL 0.40  0.38  0.46   Sodium 135 - 145 mmol/L 137  134  137   Potassium 3.5 - 5.1 mmol/L 4.2  4.3  3.7   Chloride 98 - 111 mmol/L 111  107  109   CO2 22 - 32 mmol/L '23  23  23   '$ Calcium 8.9 - 10.3 mg/dL 8.1  8.0  7.9   Total Protein 6.5 - 8.1 g/dL 5.8     Total Bilirubin 0.3 - 1.2 mg/dL 0.8     Alkaline Phos 38 - 126 U/L 82     AST 15 - 41 U/L 22     ALT 0 - 44 U/L 14       Micro Blood cultures 02/18/2022 no growth 02/17/2022 no growth  Impression/recommendation Fever likely due to Intra-abdominal infectious process secondary to small bowel perforation Patient had fecal spillage that could have led to peritonitis.  Purulent fluid in the JP drain.   Patient is currently on Zosyn, vancomycin and fluconazole .  If fever increases and white count increases Zosyn to be changed  to meropenem. Patient also has a port.  Currently there is not a suspect but will need to observe closely.  Open abdominal wall surgical wound.  Covered with wound VAC.  Observe closely to make sure that is not infected Neck  Recent omental mass status post  resection on 02/13/2022.  Metastatic lesion  History of colon cancer status post left hemicolectomy in May 2021   Anemia  Discussed the management with the patient, granddaughter and care team  [facility] will be covering by phone on Friday and the weekend.  Call if needed

## 2022-02-20 NOTE — Progress Notes (Signed)
[facility] Day(s): 7.   Post op day(s): 5 Days Post-Op.   Interval History:  Patient seen and examined She is afebrile now in the last 24 hours Mild abdominal soreness; no other complaints  Leukocytosis to 13.8K Hgb to 8.9 Renal function normal; sCr - 0.40; UO - 1800 ccs Mild hypophosphatemia to 2.4 NGT with 1100 ccs out in last 24 hours Peripheral Bcx and Bcx from port are without growth She is on Vancomycin, Zosyn, and Diflucan; ID brought on board  Surgical Drains:   - Left drain (Intraperitoneal) 70 ccs; murky fluid  - Right Drain (Retro Rectus) 50 ccs; serous Midline Wound vac; output not recorded; serosanguinous She is NPO; on TPN  Pathology reviewed:  -- Metastatic adenocarcinoma; colon origin -- Plan for output follow up with oncology (Dr Tasia Catchings)  Vital signs in last 24 hours: [min-max] current  Temp:  [98.4 F (36.9 C)-98.9 F (37.2 C)] 98.6 F (37 C) (06/15 0412) Pulse Rate:  [90-100] 90 (06/15 0412) Resp:  [20] 20 (06/15 0412) BP: (118-129)/(62-79) 118/62 (06/15 0412) SpO2:  [96 %-98 %] 96 % (06/15 0412) Weight:  [96.4 kg] 96.4 kg (06/15 0500)     Height: '4\' 11"'$  (149.9 cm) Weight: 96.4 kg BMI (Calculated): 42.92   Intake/Output last 2 shifts:  06/14 0701 - 06/15 0700 In: 683 [IV Piggyback:683] Out: 3020 [Urine:1800; Emesis/NG output:1100; Drains:120]   Physical Exam:  Constitutional: alert, cooperative and no distress  HEENT: NGT in place; output bilious  Respiratory: breathing non-labored at rest  Cardiovascular: regular rate and sinus rhythm  Gastrointestinal: Soft, incisional soreness more in the upper abdomen, non-distended, no rebound/guarding. Surgical drain in LLQ; murky appearance and RLQ; serous Genitourinary: Foley in place  Integumentary: Midline wound with wound vac; will change later today and document separately   Labs:     Latest Ref Rng & Units 02/20/2022    3:38 AM 02/19/2022    5:54 AM  02/18/2022    7:24 AM  CBC  WBC 4.0 - 10.5 K/uL 13.8  11.8  7.0   Hemoglobin 12.0 - 15.0 g/dL 8.9  9.5  10.5   Hematocrit 36.0 - 46.0 % 26.5  27.8  30.6   Platelets 150 - 400 K/uL 132  129  114       Latest Ref Rng & Units 02/20/2022    3:38 AM 02/19/2022    5:54 AM 02/18/2022    6:03 AM  CMP  Glucose 70 - 99 mg/dL 304  345  283   BUN 6 - 20 mg/dL '16  17  15   '$ Creatinine 0.44 - 1.00 mg/dL 0.40  0.38  0.46   Sodium 135 - 145 mmol/L 137  134  137   Potassium 3.5 - 5.1 mmol/L 4.2  4.3  3.7   Chloride 98 - 111 mmol/L 111  107  109   CO2 22 - 32 mmol/L '23  23  23   '$ Calcium 8.9 - 10.3 mg/dL 8.1  8.0  7.9   Total Protein 6.5 - 8.1 g/dL 5.8     Total Bilirubin 0.3 - 1.2 mg/dL 0.8     Alkaline Phos 38 - 126 U/L 82     AST 15 - 41 U/L 22     ALT 0 - 44 U/L 14        Imaging studies:  Venous US reviewed overnight; no DVT   Assessment/Plan: 56 y.o. female with leukocytosis but resolving fevers (unclear source, suspect  intra-abdominal) 5 Days Post-Op s/p re-opening of laparotomy for repair of small bowel perforation and 7 days s/p laparotomy, excision of greater omental mass, abdominal wall reconstruction with [name],appendectomy, and placement of Prevena vac.   - Will Cx LLQ drain; orders placed; discussed with RN  - Monitor fever curve; resolved x24 hours  - Continue NGT to LIS: monitor and record output  - Continue TPN; at goal rate; replete electrolytes and monitor; d/w pharmacy and greatly appreciate their assistance - Wound vac change today; Will ask for [place] RN assistance starting Friday 06/16 to get her on MWF schedule  - Will continue foley today given acute changes overnight; good UO   - Continue IV Abx (Vancomycin, Zosyn) and Antifungals (Diflucan); ID on board; Bcx without growth  - Continue surgical drains; monitor and record output  - Monitor abdominal examination; on-going bowel function  - Pain control prn; antemetic prn  - Out of bed; therapies on board;  recommendations are HHPT  All of the above findings and recommendations were discussed with the patient, and the medical team, and all of patient's questions were answered to her expressed satisfaction.  -- [name], PA-C [facility] 02/20/2022, 7:22 AM M-F: 7am - 4pm

## 2022-02-20 NOTE — Telephone Encounter (Signed)
Pt currently admitted. Attempt made to reach pt but no answer. Detailed message let and appt reminder letter mailed.

## 2022-02-20 NOTE — Inpatient Diabetes Management (Signed)
Inpatient Diabetes Program Recommendations  AACE/ADA: New Consensus Statement on Inpatient Glycemic Control (2015)  Target Ranges:  Prepandial:   less than 140 mg/dL      Peak postprandial:   less than 180 mg/dL (1-2 hours)      Critically ill patients:  140 - 180 mg/dL    Latest Reference Range & Units 02/19/22 00:21 02/19/22 06:01 02/19/22 12:06 02/19/22 18:28 02/19/22 21:51  Glucose-Capillary 70 - 99 mg/dL 328 (H)  7 units Novolog 324 (H)  7 units Novolog  8 units Levemir '@0930'$  313 (H)  7 units Novolog 340 (H)  7 units Novolog 338 (H)     8 units Levemir '@2156'$     Latest Reference Range & Units 02/19/22 23:40 02/20/22 05:49  Glucose-Capillary 70 - 99 mg/dL 296 (H)  5 units Novolog 263 (H)  5 units Novolog        Current Orders: Levemir 8 units BID Novolog 0-9 units Q6H    MD/ Pharmacy--Note 30 units Insulin Added to the TPN last PM (running 75cc/hr)  CBGs remain >250 since Midnight  Please consider:  1. Increase Levemir to 10 units BID (20% increase)  2. Increase Novolog SSI to the 0-15 unit Moderate scale  3. May also consider increasing the Insulin in the TPN as well    --Will follow patient during hospitalization--  Wyn Quaker RN, MSN, CDE Diabetes Coordinator Inpatient Glycemic Control Team Team Pager: [phone] (8a-5p)

## 2022-02-20 NOTE — Progress Notes (Signed)
Nutrition Follow-up  DOCUMENTATION CODES:   Obesity unspecified  INTERVENTION:   Continue TPN per pharmacy   Pt at high refeed risk; monitor potassium, magnesium and phosphorus labs daily until stable  Daily weights   NUTRITION DIAGNOSIS:   Increased nutrient needs related to wound healing, catabolic illness as evidenced by estimated needs.  GOAL:   Patient will meet greater than or equal to 90% of their needs -met with TPN   MONITOR:   Diet advancement, Labs, Weight trends, Skin, I & O's, Other (Comment) (TPN)  ASSESSMENT:   56 y/o female with h/o hypothyroidism, COVID 19 (3/21), kidney stones and stage 3 colon cancer (s/p left hemicolectomy 5/21 and chemotherapy) who is admitted for new pelvic mass now s/p laparotomy 6/8 (with excision of pelvic mass from greater omentum, abdominal wall reconstruction with bilateral myocutaneous flaps and mesh, incisional hernia repair, appendectomy repair and VAC placement) complicated by bowel perforation s/p reopening of recent laparotomy 6/10 (with repair of small bowel perforation, excision of mesh, placement of two phasix mesh and VAC placement). Pathology returned as metastatic adenocarcinoma.   Met with pt in room today. Pt does not speak English so pt's daughter at bedside translated. Pt reports that she is feeling ok today. Pt denies any abdominal pain or nausea. Pt has not yet passed any flatus or stool. NGT in place with 1160ml output. Pt tolerating TPN well at goal rate via her Port. Pt continues to refeed; electrolytes being replaced. Pt with hyperglycemia; insulin being added to TPN. Triglycerides wnl. Per chart, pt is up ~15lbs since admission; RD will monitor. Pt is willing to drink supplements once her diet is advanced.    Medications reviewed and include: insulin, synthroid, protonix, NaCl $RemoveBefore'@25ml'[id number]$ /hr, diflucan, zosyn, vancomycin   Labs reviewed: K 4.2 wnl, creat 0.40(L), P 2.4(L), Mg 2.2 wnl Triglycerides 123 Wbc- 13.8(H), Hgb  8.9(L), Hct 26.5(L) Cbgs- 284, 263 x 24 hrs  -Left drain (Intraperitoneal) 70 ccs; murky fluid - Right Drain (Retro Rectus) 50 ccs; serous  NUTRITION - FOCUSED PHYSICAL EXAM:  Flowsheet Row Most Recent Value  Orbital Region No depletion  Upper Arm Region No depletion  Thoracic and Lumbar Region No depletion  Buccal Region No depletion  Temple Region No depletion  Clavicle Bone Region No depletion  Clavicle and Acromion Bone Region No depletion  Scapular Bone Region No depletion  Dorsal Hand No depletion  Patellar Region No depletion  Anterior Thigh Region No depletion  Posterior Calf Region No depletion  Edema (RD Assessment) Mild  Hair Reviewed  Eyes Reviewed  Mouth Reviewed  Skin Reviewed  Nails Reviewed   Diet Order:   Diet Order             Diet NPO time specified  Diet effective now                  EDUCATION NEEDS:   Not appropriate for education at this time  Skin:  Skin Assessment: Reviewed RN Assessment (midline wound 12 x 7 x 3, VAC)  Last BM:  6/7  Height:   Ht Readings from Last 1 Encounters:  02/13/22 $RemoveB'4\' 11"'xeFLlUsG$  (1.499 m)    Weight:   Wt Readings from Last 1 Encounters:  02/20/22 96.4 kg    Ideal Body Weight:  44.3 kg  BMI:  Body mass index is 42.94 kg/m.  Estimated Nutritional Needs:   Kcal:  1800-2100kcal/day  Protein:  90-110g/d  Fluid:  1.4-1.6L/day  Koleen Distance MS, RD, LDN Please refer to [facility]  for RD and/or RD on-call/weekend/after hours pager

## 2022-02-20 NOTE — Progress Notes (Signed)
PHARMACY - TOTAL PARENTERAL NUTRITION CONSULT NOTE   Indication: Prolonged ileus  Patient Measurements: Height: '4\' 11"'$  (149.9 cm) Weight: 96.4 kg (212 lb 9.6 oz) IBW/kg (Calculated) : 43.2 TPN AdjBW (KG): 54.7 Body mass index is 42.94 kg/m.  Assessment:  56 y.o. female  s/p laparotomy, excision of greater omental mass, abdominal wall reconstruction with [name],appendectomy, and placement of Prevena vac.  Glucose / Insulin: no insulin requirements prior to TPN --insulin requirements previous 24h: 24 units in addition to Levemir 8 units bid and 20 units of regular insulin in TPN bag(BG 263-308) Electrolytes: hypophosphatemia Renal: Scr 0.42>1.29>0.52>0.46-->0.40 Hepatic: LFTs wnl Intake / Output; MIVF: 0.9 %  sodium chloride infusion at 25 mL/hr GI Imaging: GI Surgeries / Procedures: s/p laparotomy, excision of greater omental mass, abdominal wall reconstruction with [name],appendectomy, and placement of Prevena vac  Central access: 02/15/22 TPN start date: 02/15/22  Nutritional Goals: Goal TPN rate is 75 mL/hr (provides 110 g of protein and 2000 kcals per day)  RD Assessment:  Estimated Needs Total Energy Estimated Needs: 1800-2000 kcal/d Total Protein Estimated Needs: 90-110g/d Total Fluid Estimated Needs: 1.8-2.2L/d  Current Nutrition:  NPO  Plan:  Continue TPN at goal rate of 64m/hr Nutritional Components Amino acids (using 15% Clinisol): 110 grams Dextrose: 282.6 grams Lipids (using 20% SMOFlipids): 60 grams kCal: 2000/24h  Electrolytes in TPN (standard): Na 567m/L, K 5025mL, Ca 5mE23m, Mg 10mE19m and Phos increased to 25 mmol/L. Cl:Ac 1:1 Add standard MVI and trace elements to TPN Increasing insulin 30 units to TPN Continue Sensitive q6h SSI and adjust as needed, levemir increased to 10 units BID  Continue MIVF to 25 mL/hr  Monitor TPN labs on Mon/Thurs, daily until stable  WalidPearla DubonnetrmD Clinical  Pharmacist 02/20/2022 11:36 AM

## 2022-02-21 ENCOUNTER — Encounter: Payer: Self-pay | Admitting: Oncology

## 2022-02-21 ENCOUNTER — Inpatient Hospital Stay: Payer: Medicaid Other

## 2022-02-21 LAB — BASIC METABOLIC PANEL
Anion gap: 4 — ABNORMAL LOW (ref 5–15)
BUN: 17 mg/dL (ref 6–20)
CO2: 24 mmol/L (ref 22–32)
Calcium: 8 mg/dL — ABNORMAL LOW (ref 8.9–10.3)
Chloride: 109 mmol/L (ref 98–111)
Creatinine, Ser: 0.44 mg/dL (ref 0.44–1.00)
GFR, Estimated: >60 mL/min (ref >60)
Glucose, Bld: 249 mg/dL — ABNORMAL HIGH (ref 70–99)
Potassium: 4.1 mmol/L (ref 3.5–5.1)
Sodium: 137 mmol/L (ref 135–145)

## 2022-02-21 LAB — GLUCOSE, CAPILLARY
Glucose-Capillary: 195 mg/dL — ABNORMAL HIGH (ref 70–99)
Glucose-Capillary: 214 mg/dL — ABNORMAL HIGH (ref 70–99)
Glucose-Capillary: 235 mg/dL — ABNORMAL HIGH (ref 70–99)
Glucose-Capillary: 237 mg/dL — ABNORMAL HIGH (ref 70–99)

## 2022-02-21 LAB — PHOSPHORUS: Phosphorus: 3 mg/dL (ref 2.5–4.6)

## 2022-02-21 LAB — CBC
HCT: 28.3 % — ABNORMAL LOW (ref 36.0–46.0)
Hemoglobin: 9.4 g/dL — ABNORMAL LOW (ref 12.0–15.0)
MCH: 28.9 pg (ref 26.0–34.0)
MCHC: 33.2 g/dL (ref 30.0–36.0)
MCV: 87.1 fL (ref 80.0–100.0)
Platelets: 160 10*3/uL (ref 150–400)
RBC: 3.25 MIL/uL — ABNORMAL LOW (ref 3.87–5.11)
RDW: 14.8 % (ref 11.5–15.5)
WBC: 16.2 10*3/uL — ABNORMAL HIGH (ref 4.0–10.5)
nRBC: 0.4 % — ABNORMAL HIGH (ref 0.0–0.2)

## 2022-02-21 LAB — MAGNESIUM: Magnesium: 2.2 mg/dL (ref 1.7–2.4)

## 2022-02-21 MED ORDER — TRACE MINERALS CU-MN-SE-ZN 300-55-60-3000 MCG/ML IV SOLN
INTRAVENOUS | Status: AC
  Filled 2022-02-21: qty 733.2

## 2022-02-21 MED ORDER — SODIUM CHLORIDE 0.9 % IV SOLN
1.0000 g | Freq: Three times a day (TID) | INTRAVENOUS | Status: AC
  Administered 2022-02-21 – 2022-02-27 (×20): 1 g via INTRAVENOUS
  Filled 2022-02-21: qty 1
  Filled 2022-02-21: qty 20
  Filled 2022-02-21: qty 1
  Filled 2022-02-21: qty 20
  Filled 2022-02-21: qty 1
  Filled 2022-02-21 (×3): qty 20
  Filled 2022-02-21 (×2): qty 1
  Filled 2022-02-21: qty 20
  Filled 2022-02-21: qty 1
  Filled 2022-02-21 (×4): qty 20
  Filled 2022-02-21: qty 1
  Filled 2022-02-21: qty 20
  Filled 2022-02-21 (×2): qty 1

## 2022-02-21 MED ORDER — VANCOMYCIN HCL 1250 MG/250ML IV SOLN
1250.0000 mg | INTRAVENOUS | Status: DC
Start: 2022-02-21 — End: 2022-02-24
  Administered 2022-02-21 – 2022-02-23 (×3): 1250 mg via INTRAVENOUS
  Filled 2022-02-21 (×4): qty 250

## 2022-02-21 MED ORDER — INSULIN ASPART 100 UNIT/ML IJ SOLN
0.0000 [IU] | Freq: Three times a day (TID) | INTRAMUSCULAR | Status: AC
  Administered 2022-02-21 (×2): 5 [IU] via SUBCUTANEOUS
  Administered 2022-02-22: 2 [IU] via SUBCUTANEOUS
  Administered 2022-02-22: 3 [IU] via SUBCUTANEOUS
  Administered 2022-02-22: 5 [IU] via SUBCUTANEOUS
  Administered 2022-02-23 (×2): 2 [IU] via SUBCUTANEOUS
  Administered 2022-02-24: 3 [IU] via SUBCUTANEOUS
  Administered 2022-02-24: 2 [IU] via SUBCUTANEOUS
  Administered 2022-02-25 (×3): 3 [IU] via SUBCUTANEOUS
  Administered 2022-02-26 – 2022-02-27 (×3): 2 [IU] via SUBCUTANEOUS
  Administered 2022-02-28: 5 [IU] via SUBCUTANEOUS
  Administered 2022-02-28: 3 [IU] via SUBCUTANEOUS
  Administered 2022-02-28: 2 [IU] via SUBCUTANEOUS
  Administered 2022-03-01: 3 [IU] via SUBCUTANEOUS
  Administered 2022-03-01 – 2022-03-02 (×4): 5 [IU] via SUBCUTANEOUS
  Administered 2022-03-02: 2 [IU] via SUBCUTANEOUS
  Administered 2022-03-03 (×3): 3 [IU] via SUBCUTANEOUS
  Administered 2022-03-04: 5 [IU] via SUBCUTANEOUS
  Administered 2022-03-04: 3 [IU] via SUBCUTANEOUS
  Administered 2022-03-04: 5 [IU] via SUBCUTANEOUS
  Administered 2022-03-05 (×3): 3 [IU] via SUBCUTANEOUS
  Administered 2022-03-06 (×3): 5 [IU] via SUBCUTANEOUS
  Administered 2022-03-07: 8 [IU] via SUBCUTANEOUS
  Administered 2022-03-07 – 2022-03-08 (×4): 5 [IU] via SUBCUTANEOUS
  Administered 2022-03-09 – 2022-03-10 (×4): 3 [IU] via SUBCUTANEOUS
  Administered 2022-03-10: 2 [IU] via SUBCUTANEOUS
  Administered 2022-03-10: 5 [IU] via SUBCUTANEOUS
  Administered 2022-03-11 (×2): 3 [IU] via SUBCUTANEOUS
  Administered 2022-03-12 – 2022-03-13 (×3): 2 [IU] via SUBCUTANEOUS
  Administered 2022-03-13: 3 [IU] via SUBCUTANEOUS
  Administered 2022-03-13: 2 [IU] via SUBCUTANEOUS
  Administered 2022-03-14: 3 [IU] via SUBCUTANEOUS
  Administered 2022-03-14: 2 [IU] via SUBCUTANEOUS
  Administered 2022-03-14: 3 [IU] via SUBCUTANEOUS
  Administered 2022-03-15 (×2): 2 [IU] via SUBCUTANEOUS
  Administered 2022-03-15 – 2022-03-16 (×2): 3 [IU] via SUBCUTANEOUS
  Administered 2022-03-16 – 2022-03-18 (×8): 2 [IU] via SUBCUTANEOUS
  Administered 2022-03-19 (×3): 3 [IU] via SUBCUTANEOUS
  Administered 2022-03-20 – 2022-03-21 (×4): 2 [IU] via SUBCUTANEOUS
  Administered 2022-03-21: 3 [IU] via SUBCUTANEOUS
  Administered 2022-03-21: 2 [IU] via SUBCUTANEOUS
  Administered 2022-03-22: 3 [IU] via SUBCUTANEOUS
  Administered 2022-03-22 (×2): 2 [IU] via SUBCUTANEOUS
  Administered 2022-03-23: 3 [IU] via SUBCUTANEOUS
  Administered 2022-03-23 – 2022-03-24 (×3): 2 [IU] via SUBCUTANEOUS
  Administered 2022-03-24: 3 [IU] via SUBCUTANEOUS
  Administered 2022-03-24: 2 [IU] via SUBCUTANEOUS
  Administered 2022-03-25 – 2022-03-26 (×3): 3 [IU] via SUBCUTANEOUS
  Administered 2022-03-26: 2 [IU] via SUBCUTANEOUS
  Administered 2022-03-26 – 2022-03-27 (×3): 3 [IU] via SUBCUTANEOUS
  Administered 2022-03-27: 2 [IU] via SUBCUTANEOUS
  Administered 2022-03-28 (×2): 3 [IU] via SUBCUTANEOUS
  Administered 2022-03-28 – 2022-03-30 (×5): 2 [IU] via SUBCUTANEOUS
  Administered 2022-03-30: 3 [IU] via SUBCUTANEOUS
  Administered 2022-03-30: 5 [IU] via SUBCUTANEOUS
  Administered 2022-03-31: 3 [IU] via SUBCUTANEOUS
  Administered 2022-03-31 – 2022-04-01 (×2): 2 [IU] via SUBCUTANEOUS
  Administered 2022-04-01 – 2022-04-02 (×3): 3 [IU] via SUBCUTANEOUS
  Administered 2022-04-03: 2 [IU] via SUBCUTANEOUS
  Administered 2022-04-03 – 2022-04-04 (×4): 3 [IU] via SUBCUTANEOUS
  Administered 2022-04-04 – 2022-04-05 (×2): 2 [IU] via SUBCUTANEOUS
  Administered 2022-04-05: 3 [IU] via SUBCUTANEOUS
  Administered 2022-04-05: 2 [IU] via SUBCUTANEOUS
  Administered 2022-04-06: 3 [IU] via SUBCUTANEOUS
  Administered 2022-04-06 – 2022-04-11 (×10): 2 [IU] via SUBCUTANEOUS
  Administered 2022-04-12: 3 [IU] via SUBCUTANEOUS
  Administered 2022-04-12 – 2022-04-16 (×9): 2 [IU] via SUBCUTANEOUS
  Administered 2022-04-17: 3 [IU] via SUBCUTANEOUS
  Administered 2022-04-17 – 2022-04-20 (×7): 2 [IU] via SUBCUTANEOUS
  Administered 2022-04-20 – 2022-04-21 (×2): 3 [IU] via SUBCUTANEOUS
  Administered 2022-04-21 (×2): 2 [IU] via SUBCUTANEOUS
  Administered 2022-04-22: 3 [IU] via SUBCUTANEOUS
  Administered 2022-04-22 – 2022-04-27 (×11): 2 [IU] via SUBCUTANEOUS
  Administered 2022-04-28: 3 [IU] via SUBCUTANEOUS
  Administered 2022-04-28 – 2022-04-29 (×4): 2 [IU] via SUBCUTANEOUS
  Administered 2022-04-29: 3 [IU] via SUBCUTANEOUS
  Administered 2022-04-30 – 2022-05-03 (×10): 2 [IU] via SUBCUTANEOUS
  Administered 2022-05-03: 3 [IU] via SUBCUTANEOUS
  Administered 2022-05-04 – 2022-05-06 (×5): 2 [IU] via SUBCUTANEOUS
  Administered 2022-05-06: 3 [IU] via SUBCUTANEOUS
  Administered 2022-05-07 (×2): 2 [IU] via SUBCUTANEOUS
  Administered 2022-05-08 – 2022-05-09 (×2): 3 [IU] via SUBCUTANEOUS
  Administered 2022-05-09 – 2022-05-16 (×16): 2 [IU] via SUBCUTANEOUS
  Administered 2022-05-17: 3 [IU] via SUBCUTANEOUS
  Administered 2022-05-17 – 2022-05-20 (×7): 2 [IU] via SUBCUTANEOUS
  Administered 2022-05-21: 3 [IU] via SUBCUTANEOUS
  Administered 2022-05-22 – 2022-05-31 (×24): 2 [IU] via SUBCUTANEOUS
  Administered 2022-06-01: 3 [IU] via SUBCUTANEOUS
  Administered 2022-06-01: 2 [IU] via SUBCUTANEOUS
  Filled 2022-02-21 (×243): qty 1

## 2022-02-21 NOTE — Plan of Care (Signed)

## 2022-02-21 NOTE — Consult Note (Addendum)
[place] Nurse wound follow up Surgical PA at the bedside to assess wound appearance. Refer to their progress notes; fascia was opened at lower wound edge.  Pt was medicated for pain and tolerated with mod amt discomfort. Full thickness post-op wound 13X7X4cm, red and moist, yellow fascia to wound edges.  Upper wound at 12:00 o'clock with yellow moist weeping fascia which has been left in place; 3X1X.2cm Dressing procedure/placement/frequency: Applied 2 pieces black foam to open wound, then one piece Aquacel to upper fascia area, then attached to 169m cont suction. Mod amt pink drainage in the cannister. Plan for WOrdervilleteam to change again on Monday.  Surgical team discussed plan of care with patient's family at the bedside. DJulien GirtMSN, RN, CNorth River Shores CFarmer CNeligh

## 2022-02-21 NOTE — Inpatient Diabetes Management (Signed)
Inpatient Diabetes Program Recommendations  AACE/ADA: New Consensus Statement on Inpatient Glycemic Control (2015)  Target Ranges:  Prepandial:   less than 140 mg/dL      Peak postprandial:   less than 180 mg/dL (1-2 hours)      Critically ill patients:  140 - 180 mg/dL    Latest Reference Range & Units 02/19/22 23:40 02/20/22 05:49 02/20/22 11:57 02/20/22 16:44 02/20/22 22:48 02/20/22 23:44 02/21/22 04:56  Glucose-Capillary 70 - 99 mg/dL 296 (H) 263 (H) 284 (H) 272 (H) 219 (H) 219 (H) 235 (H)  (H): Data is abnormally high   Current Orders: Levemir 10 units BID Novolog 0-9 units Q6H       MD/ Pharmacy--Note 30 units Insulin Added to the TPN (running 75cc/hr)  Also note Levemir increased to 10 units BID last PM   CBGs remain >200 since Midnight   Please consider:   1. Increase Novolog SSI to the 0-15 unit Moderate scale   2. May also consider increasing the Insulin in the TPN as well to 40 units   --Will follow patient during hospitalization--  Wyn Quaker RN, MSN, CDE Diabetes Coordinator Inpatient Glycemic Control Team Team Pager: [phone] (8a-5p)

## 2022-02-21 NOTE — Progress Notes (Signed)
PHARMACY - TOTAL PARENTERAL NUTRITION CONSULT NOTE   Indication: Prolonged ileus  Patient Measurements: Height: '4\' 11"'$  (149.9 cm) Weight: 96.3 kg (212 lb 4.8 oz) IBW/kg (Calculated) : 43.2 TPN AdjBW (KG): 54.7 Body mass index is 42.88 kg/m.  Assessment:  56 y.o. female  s/p laparotomy, excision of greater omental mass, abdominal wall reconstruction with [name],appendectomy, and placement of Prevena vac.  Glucose / Insulin: no insulin requirements prior to TPN --insulin requirements previous 24h: 16 units in addition to Levemir 10 units bid and 30 units of regular insulin in TPN bag(BG 263-308) Electrolytes: hypophosphatemia Renal: Scr 0.42>1.29>0.52>0.46-->0.4 Hepatic: LFTs wnl Intake / Output; MIVF: 0.9 %  sodium chloride infusion at 25 mL/hr / (-5.4L) this admit GI Imaging: GI Surgeries / Procedures: s/p laparotomy, excision of greater omental mass, abdominal wall reconstruction with [name],appendectomy, and placement of Prevena vac  Central access: 02/15/22 TPN start date: 02/15/22  Nutritional Goals: Goal TPN rate is 75 mL/hr (provides 110 g of protein and 2000 kcals per day)  RD Assessment:  Estimated Needs Total Energy Estimated Needs: 1800-2100kcal/day Total Protein Estimated Needs: 90-110g/d Total Fluid Estimated Needs: 1.4-1.6L/day  Current Nutrition:  NPO  Plan:  Continue TPN at goal rate of 85m/hr Nutritional Components Amino acids (using 15% Clinisol): 110 grams Dextrose: 282.6 grams Lipids (using 20% SMOFlipids): 60 grams kCal: 2000/24h  Electrolytes in TPN (standard): Na 53m/L, K 5073mL, Ca 5mE26m, Mg 10mE60m and Phos 25 mmol/L. Cl:Ac 1:1 Add standard MVI and trace elements to TPN Increasing insulin 40 units to TPN Increasing to moderate q6h SSI and adjust as needed, levemir 10 units BID  Continue MIVF to 25 mL/hr  Monitor TPN labs on Mon/Thurs, daily until stable  WalidPearla DubonnetrmD Clinical  Pharmacist 02/21/2022 11:16 AM

## 2022-02-21 NOTE — Consult Note (Addendum)
Pharmacy Antibiotic Note  Debra [name] is a 56 y.o. female  s/p laparotomy, excision of greater omental mass, abdominal wall reconstruction with [name],appendectomy, and placement of Prevena vac. Pt febrile to 101.1 overnight. Pharmacy has been consulted for vancomycin and fluconazole dosing for intra-abdominal infection.   Patient has gained roughly 15 pounds(7 kg) since admission. Renal function remains stable.   Plan: Stopping Zosyn and starting meropenem as patient's WBC continues to rise  Meropenem 1g IV Q 8 hours  Adjusting Vancomycin to 1250 mg IV q24h Goal AUC 400-550  Est AUC: 543.2 Est Cmax: 38 Est Cmin: 13 Calculated with SCr 0.8 (rounded up), Vd 0.5 (BMI 39)   Patient is also on Fluconazole 400 mg IV q24h   Monitor clinical picture, renal function, and vancomycin levels at steady state F/U C&S, abx deescalation / LOT    Height: '4\' 11"'$  (149.9 cm) Weight: 96.3 kg (212 lb 4.8 oz) IBW/kg (Calculated) : 43.2  Temp (24hrs), Avg:99.2 F (37.3 C), Min:97.9 F (36.6 C), Max:100.7 F (38.2 C)  Recent Labs  Lab 02/17/22 0500 02/18/22 0603 02/18/22 0724 02/19/22 0554 02/20/22 0338 02/21/22 0413 02/21/22 0800  WBC 4.3  --  7.0 11.8* 13.8*  --  16.2*  CREATININE 0.52 0.46  --  0.38* 0.40* 0.44  --      Estimated Creatinine Clearance: 77.9 mL/min (by C-G formula based on SCr of 0.44 mg/dL).    No Known Allergies  Antimicrobials this admission: 6/10 Zosyn >> 6/16 6/14 Vancomycin >>  6/14 Fluconazole >>  6/16 Meropenem >>  Dose adjustments this admission:   Microbiology results: 6/12 BCx: NG x 4 days  6/13 BCx: NG x 3 days 6/15 Wound cx: few yeast, moderate WBC(still pending)   Thank you for allowing pharmacy to be a part of this patient's care.  [name], PharmD 02/21/2022 11:12 AM

## 2022-02-21 NOTE — Progress Notes (Signed)
[facility] [place] [facility] Day(s): 8.   Post op day(s): 6 Days Post-Op.   Interval History:  Patient seen and examined T-Max 100.19F at 1600 (06/15). Most of her discomfort is from NGT Some upper abdominal pain Leukocytosis to 16.2K; worsening  Renal function normal; sCr - 0.44; UO - 600 ccs + unmeasured No electrolyte derangements NGT replaced overnight; 1100 ccs out in last 24 hours Peripheral Bcx and Bcx from port are without growth She is on Vancomycin, Zosyn, and Diflucan; ID brought on board  Surgical Drains:   - Left drain (Intraperitoneal) 65 ccs; murky fluid  - Right Drain (Retro Rectus) 50 ccs; serous Cx from LLQ drain with moderate WBCs, few yeast Midline Wound vac; 100; serosanguinous She is NPO; on TPN  Pathology reviewed:  -- Metastatic adenocarcinoma; colon origin -- Plan for output follow up with oncology (Dr Tasia Catchings)  Vital signs in last 24 hours: [min-max] current  Temp:  [98.8 F (37.1 C)-100.7 F (38.2 C)] 98.8 F (37.1 C) (06/16 0459) Pulse Rate:  [91-98] 97 (06/16 0459) Resp:  [18-20] 20 (06/16 0459) BP: (91-138)/(58-82) 121/71 (06/16 0459) SpO2:  [95 %-98 %] 96 % (06/16 0459) Weight:  [96.3 kg] 96.3 kg (06/16 0459)     Height: '4\' 11"'$  (149.9 cm) Weight: 96.3 kg BMI (Calculated): 42.86   Intake/Output last 2 shifts:  06/15 0701 - 06/16 0700 In: 2139.2 [I.V.:1762.3; IV Piggyback:376.9] Out: 815 [Urine:600; Drains:215]   Physical Exam:  Constitutional: alert, cooperative and no distress  HEENT: NGT in place; output bilious  Respiratory: breathing non-labored at rest  Cardiovascular: regular rate and sinus rhythm  Gastrointestinal: Soft, incisional soreness more in the upper abdomen, non-distended, no rebound/guarding. Surgical drain in LLQ; murky appearance and RLQ; serous Integumentary: Midline wound with wound vac, removed this at bedside. Fascia intact, no evidence of bilious drainage. IN the inferior portion this  was loosely approximated with sutures. I elected to remove these do to concerns of drainage from the wound. I think this will allow the wound to drain and heal much better. Superior portion loosely approximated, no drainage. No erythema. Wound vac replaced with WOC RN.   Labs:     Latest Ref Rng & Units 02/20/2022    3:38 AM 02/19/2022    5:54 AM 02/18/2022    7:24 AM  CBC  WBC 4.0 - 10.5 K/uL 13.8  11.8  7.0   Hemoglobin 12.0 - 15.0 g/dL 8.9  9.5  10.5   Hematocrit 36.0 - 46.0 % 26.5  27.8  30.6   Platelets 150 - 400 K/uL 132  129  114       Latest Ref Rng & Units 02/21/2022    4:13 AM 02/20/2022    3:38 AM 02/19/2022    5:54 AM  CMP  Glucose 70 - 99 mg/dL 249  304  345   BUN 6 - 20 mg/dL '17  16  17   '$ Creatinine 0.44 - 1.00 mg/dL 0.44  0.40  0.38   Sodium 135 - 145 mmol/L 137  137  134   Potassium 3.5 - 5.1 mmol/L 4.1  4.2  4.3   Chloride 98 - 111 mmol/L 109  111  107   CO2 22 - 32 mmol/L '24  23  23   '$ Calcium 8.9 - 10.3 mg/dL 8.0  8.1  8.0   Total Protein 6.5 - 8.1 g/dL  5.8    Total Bilirubin 0.3 - 1.2 mg/dL  0.8    Alkaline  Phos 38 - 126 U/L  82    AST 15 - 41 U/L  22    ALT 0 - 44 U/L  14       Imaging studies: No new pertinent imaging studies   Assessment/Plan: 56 y.o. female with intermittent fevers (unclear source, suspect intra-abdominal) 6 Days Post-Op s/p re-opening of laparotomy for repair of small bowel perforation and 7 days s/p laparotomy, excision of greater omental mass, abdominal wall reconstruction with [name],appendectomy, and placement of Prevena vac.    - Monitor leukocytosis; worsening; trend. She may need repeat imaging should this continue to worsen   - Monitor fever curve; fever to 100.67F at 1600 on 06/15  - Continue NGT to LIS: monitor and record output  - Continue TPN; at goal rate; replete electrolytes and monitor; d/w pharmacy and greatly appreciate their assistance - Wound vac change today; MWF; [place] RN on-board (greatly appreciate  help)  - Continue IV Abx (Vancomycin, Zosyn) and Antifungals (Diflucan); ID on board; Bcx without growth; Cx from drain pending  - Continue surgical drains; monitor and record output  - Monitor abdominal examination; on-going bowel function  - Pain control prn; antemetic prn  - Out of bed; therapies on board; recommendations are HHPT  All of the above findings and recommendations were discussed with the patient, and the medical team, and all of patient's questions were answered to her expressed satisfaction.  -- [name], PA-C [facility] 02/21/2022, 7:25 AM M-F: 7am - 4pm

## 2022-02-21 NOTE — Progress Notes (Addendum)
Patient dislodged tube while on BSC. Contacted [place] who advised me to reinsert tube.  Reinserted NGT to right nare and verified with x-ray. Reconnected to suction w/o incidence.

## 2022-02-21 NOTE — TOC Initial Note (Signed)
Transition of Care [facility]) - Initial/Assessment Note    Patient Details  Name: Debra [name] MRN: [id number] Date of Birth: September 19, 1965  Transition of Care [facility]) CM/SW Contact:    Beverly Sessions, RN Phone Number: 02/21/2022, 4:28 PM  Clinical Narrative:                   Met with patient and daughter at bedside utilizing [place] interpreter  Admitted for: s/p hernia repair.  Wound vac placement Admitted from: Home alone.  Will be staying with daughter at discharge. [place] PCP: [place]: Denies issues obtaining medications  Current home health/prior home health/DME:     Patient currently requiring TPN Patient will need [place], PT and OT.  Will arrange closer to discharge - Patient will need wound vac (Sarah with TOC has given Olivia Mackie with [facility] a heads up) Patient will also need BSC, RW, and hospital bed   Patient Goals and CMS Choice        Expected Discharge Plan and Services                                                Prior Living Arrangements/Services                       Activities of Daily Living      Permission Sought/Granted                  Emotional Assessment              Admission diagnosis:  S/P hernia repair [Z79.150, Z87.19] Patient Active Problem List   Diagnosis Date Noted   S/P hernia repair 02/13/2022   Pre-operative laboratory examination    Port-A-Cath in place 12/25/2021   Neuropathy 12/25/2021   Obesity (BMI 35.0-39.9 without comorbidity) 12/25/2021   Hx of colon cancer, stage I    Polyp of colon    Encounter for antineoplastic chemotherapy 09/03/2020   Encounter for central line placement    Loss of weight 02/15/2020   Goals of care, counseling/discussion 02/15/2020   Malignant neoplasm of sigmoid colon ([place]) 02/15/2020   Pressure injury of skin 02/03/2020   Bowel obstruction ([place]) 01/29/2020   Iron deficiency anemia due to chronic blood loss  11/22/2019   COVID-19 virus detected 11/20/2019   Symptomatic anemia 11/20/2019   Hypothyroidism 11/20/2019   Iron deficiency anemia 11/20/2019   GIB (gastrointestinal bleeding) 11/20/2019   PCP:  [name], MD Pharmacy:   [facility], [place] [address] Phone: [phone] Fax: [phone], [place] [address] Phone: [phone] Fax: [phone]  [facility] [facility] [address], [place] [address] Phone: [phone] Fax: [phone]  [facility] [address]), [place] [address] [place]) [place] [address] Phone: [phone] Fax: [phone]     Social Determinants of Health ([place]) Interventions    Readmission Risk Interventions     No data to display

## 2022-02-21 NOTE — Progress Notes (Signed)
Physical Therapy Treatment Patient Details Name: Debra [name] MRN: [id number] DOB: 08/29/1966 Today's Date: 02/21/2022   History of Present Illness Pt is a 56 y.o. female with PMH including hypothyroidism, iron deficient anemia, colon cancer.  Here s/p mass removal with hernia repair and appendectomy 6/8, bowel perforation repair 6/10.    PT Comments    Pt agreeable to PT today and was seated on [facility] upon arrival. Medical spanish interpreter Bradley Ferris services were utilized during session. Pt maintained balance while half-standing at [facility] with CGA during max A for hygiene. Pt completed step pivot transfer from [facility] to chair with CGA followed by seated there-ex. Ambulation limited by NGT, however, pt would benefit from continued skilled PT including standing tolerance and balance activities to further progress toward her goals. Continue to recommend SNF at discharge.   Recommendations for follow up therapy are one component of a multi-disciplinary discharge planning process, led by the attending physician.  Recommendations may be updated based on patient status, additional functional criteria and insurance authorization.  Follow Up Recommendations  Skilled nursing-short term rehab (<3 hours/day)     Assistance Recommended at Discharge Frequent or constant Supervision/Assistance  Patient can return home with the following Help with stairs or ramp for entrance;Assist for transportation;Assistance with cooking/housework;A lot of help with bathing/dressing/bathroom;A little help with walking and/or transfers   Equipment Recommendations  Rolling walker (2 wheels);Hospital bed    Recommendations for Other Services       Precautions / Restrictions Precautions Precautions: Fall Restrictions Weight Bearing Restrictions: No Other Position/Activity Restrictions: Abdominal surgery     Mobility  Bed Mobility               General bed mobility comments: NT due to pt sitting  on BSC at start of session and completing step pivot transfer to recliner.    Transfers Overall transfer level: Needs assistance Equipment used: Rolling walker (2 wheels) Transfers: Sit to/from Stand, Bed to chair/wheelchair/BSC Sit to Stand: Min guard   Step pivot transfers: Min guard       General transfer comment: Pt able to half-stand with CGA during assistance for toileting hygiene without LOB. Pt completed transfer from [facility] to chair by completing a step pivot 180 deg turn with RW, CGA, and line mgmt.    Ambulation/Gait Ambulation/Gait assistance: Min guard Gait Distance (Feet): 3 Feet Assistive device: Rolling walker (2 wheels) Gait Pattern/deviations: Step-to pattern, Decreased step length - right, Decreased step length - left, Decreased stride length, Wide base of support Gait velocity: decreased     General Gait Details: Pt completed step pivot transfer slowly and cautiously with RW and CGA without LOB.   Stairs             Wheelchair Mobility    Modified Rankin (Stroke Patients Only)       Balance Overall balance assessment: Needs assistance Sitting-balance support: No upper extremity supported, Feet unsupported Sitting balance-Leahy Scale: Good Sitting balance - Comments: Pt sat on BSC with feet unsupported because bilat feet did not touch ground, and bilat UEs comfortably resting in lap.   Standing balance support: Bilateral upper extremity supported Standing balance-Leahy Scale: Good Standing balance comment: Pt utilized RW with bilat UE support. However, pt also able to half-stand during toileting hygiene without LOB.                            Cognition Arousal/Alertness: Awake/alert Behavior During Therapy: WFL for tasks assessed/performed  Overall Cognitive Status: Within Functional Limits for tasks assessed                                 General Comments: Pt agreeable to therapy        Exercises General  Exercises - Lower Extremity Long Arc Quad: AROM, Strengthening, Both, 20 reps, Seated Hip Flexion/Marching: AROM, Strengthening, Both, 10 reps, Seated Other Exercises Other Exercises: Pt seated on BSC on arrival. Pt completed half-stand with CGA while max A for toileting hygiene.    General Comments        Pertinent Vitals/Pain Pain Assessment Pain Assessment: 0-10 Pain Score: 4  Pain Location: R abdomen near JP drain Pain Descriptors / Indicators: Operative site guarding Pain Intervention(s): Limited activity within patient's tolerance, Monitored during session, Repositioned    Home Living                          Prior Function            PT Goals (current goals can now be found in the care plan section) Acute Rehab PT Goals Patient Stated Goal: to go home PT Goal Formulation: With patient/family Time For Goal Achievement: 03/01/22 Potential to Achieve Goals: Fair Progress towards PT goals: Progressing toward goals    Frequency    Min 2X/week      PT Plan Current plan remains appropriate    Co-evaluation              AM-PAC PT "6 Clicks" Mobility   Outcome Measure  Help needed turning from your back to your side while in a flat bed without using bedrails?: A Little Help needed moving from lying on your back to sitting on the side of a flat bed without using bedrails?: A Little Help needed moving to and from a bed to a chair (including a wheelchair)?: A Little Help needed standing up from a chair using your arms (e.g., wheelchair or bedside chair)?: A Little Help needed to walk in hospital room?: A Little Help needed climbing 3-5 steps with a railing? : A Lot 6 Click Score: 17    End of Session Equipment Utilized During Treatment: Gait belt Activity Tolerance: Patient tolerated treatment well Patient left: in chair;with call bell/phone within reach;with chair alarm set;with family/visitor present Nurse Communication: Mobility status;Other  (comment) (Pt current pain level of 4/10 in R side of abdomen) PT Visit Diagnosis: Muscle weakness (generalized) (M62.81);Difficulty in walking, not elsewhere classified (R26.2)     Time: 3875-6433 PT Time Calculation (min) (ACUTE ONLY): 23 min  Charges:                        Rella Larve, SPT  Iyad Deroo 02/21/2022, 4:36 PM

## 2022-02-22 ENCOUNTER — Other Ambulatory Visit: Payer: Self-pay | Admitting: Nurse Practitioner

## 2022-02-22 LAB — CULTURE, BLOOD (ROUTINE X 2)
Culture: NO GROWTH
Culture: NO GROWTH

## 2022-02-22 LAB — CBC
HCT: 26.6 % — ABNORMAL LOW (ref 36.0–46.0)
Hemoglobin: 8.7 g/dL — ABNORMAL LOW (ref 12.0–15.0)
MCH: 28.9 pg (ref 26.0–34.0)
MCHC: 32.7 g/dL (ref 30.0–36.0)
MCV: 88.4 fL (ref 80.0–100.0)
Platelets: 177 10*3/uL (ref 150–400)
RBC: 3.01 MIL/uL — ABNORMAL LOW (ref 3.87–5.11)
RDW: 15.1 % (ref 11.5–15.5)
WBC: 16.6 10*3/uL — ABNORMAL HIGH (ref 4.0–10.5)
nRBC: 0.2 % (ref 0.0–0.2)

## 2022-02-22 LAB — GLUCOSE, CAPILLARY
Glucose-Capillary: 201 mg/dL — ABNORMAL HIGH (ref 70–99)
Glucose-Capillary: 160 mg/dL — ABNORMAL HIGH (ref 70–99)
Glucose-Capillary: 171 mg/dL — ABNORMAL HIGH (ref 70–99)

## 2022-02-22 LAB — PHOSPHORUS: Phosphorus: 2.4 mg/dL — ABNORMAL LOW (ref 2.5–4.6)

## 2022-02-22 LAB — BASIC METABOLIC PANEL
Anion gap: 4 — ABNORMAL LOW (ref 5–15)
BUN: 16 mg/dL (ref 6–20)
CO2: 24 mmol/L (ref 22–32)
Calcium: 7.9 mg/dL — ABNORMAL LOW (ref 8.9–10.3)
Chloride: 111 mmol/L (ref 98–111)
Creatinine, Ser: 0.31 mg/dL — ABNORMAL LOW (ref 0.44–1.00)
GFR, Estimated: >60 mL/min (ref >60)
Glucose, Bld: 204 mg/dL — ABNORMAL HIGH (ref 70–99)
Potassium: 3.9 mmol/L (ref 3.5–5.1)
Sodium: 139 mmol/L (ref 135–145)

## 2022-02-22 LAB — MAGNESIUM: Magnesium: 2.1 mg/dL (ref 1.7–2.4)

## 2022-02-22 MED ORDER — LEVOTHYROXINE SODIUM 100 MCG IV SOLR
100.0000 ug | Freq: Every day | INTRAVENOUS | Status: DC
  Filled 2022-02-22 (×2): qty 5

## 2022-02-22 MED ORDER — TRACE MINERALS CU-MN-SE-ZN 300-55-60-3000 MCG/ML IV SOLN
INTRAVENOUS | Status: AC
  Filled 2022-02-22: qty 733.2

## 2022-02-22 NOTE — Progress Notes (Signed)
02/22/2022  Subjective: Patient is 7 Days Post-Op.  No acute events overnight.  Patient has not had a fever since 6/15 at 3 PM.  She was changed to meropenem yesterday per previous ID recommendations.  Her white blood cell count today is relatively stable at 16.6.  Patient denies any worsening pain.  Reports still having discomfort in the upper abdomen as well as the NG tube insertion at the nose.  She reports also having feeling that she has gas pains.  Denies any flatus or bowel movement yet.  NG output not recorded yesterday but appears bilious in the canister.  Wound VAC had 500 mL out yesterday, right drain 80, and left drain 40.  Vital signs: Temp:  [98.2 F (36.8 C)-98.9 F (37.2 C)] 98.2 F (36.8 C) (06/17 0806) Pulse Rate:  [88-98] 88 (06/17 0806) Resp:  [18-20] 18 (06/17 0806) BP: (117-120)/(65-75) 117/65 (06/17 0806) SpO2:  [96 %-97 %] 97 % (06/17 0806)   Intake/Output: 06/16 0701 - 06/17 0700 In: 786.4 [I.V.:786.4] Out: 1570 [Urine:950; Drains:620] Last BM Date : 02/12/22  Physical Exam: Constitutional: No acute distress Abdomen: Soft, nondistended, with appropriate tenderness to palpation in more so in the upper abdomen compared to the lower abdomen.  Midline incision with wound VAC in place with good seal.  Bilateral drains in place with left drain containing more fat necrosis/seropurulent fluid and right drain with serous fluid.  Labs:  Recent Labs    02/21/22 0800 02/22/22 0645  WBC 16.2* 16.6*  HGB 9.4* 8.7*  HCT 28.3* 26.6*  PLT 160 177   Recent Labs    02/21/22 0413 02/22/22 0645  NA 137 139  K 4.1 3.9  CL 109 111  CO2 24 24  GLUCOSE 249* 204*  BUN 17 16  CREATININE 0.44 0.31*  CALCIUM 8.0* 7.9*   No results for input(s): "LABPROT", "INR" in the last 72 hours.  Imaging: No results found.  Assessment/Plan: This is a 56 y.o. female s/p excision of omental mass with abdominal wall reconstruction on 02/13/2022 followed by takeback on 02/15/2022 for  repair of small bowel perforation.  -Patient reports having some gas pains which could potentially be the beginnings of return of bowel function.  Currently NG output was not recorded but she denies any flatus or bowel movement.  For now, we will continue with n.p.o. diet, NG tube to suction, and TPN for nutrition.   -Continue antibiotics per ID recommendations.  She is currently on meropenem, vancomycin, and Diflucan.  White blood cell count today stable.  We will continue following.  She may need repeat imaging if there is any worsening. - Continue monitor drain output and wound VAC. - Out of bed, ambulate, physical therapy is recommending SNF at discharge.   Melvyn Neth, [facility]

## 2022-02-22 NOTE — Progress Notes (Signed)
PHARMACY - TOTAL PARENTERAL NUTRITION CONSULT NOTE   Indication: Prolonged ileus  Patient Measurements: Height: '4\' 11"'$  (149.9 cm) Weight: 96.3 kg (212 lb 4.8 oz) IBW/kg (Calculated) : 43.2 TPN AdjBW (KG): 54.7 Body mass index is 42.88 kg/m.  Assessment:  56 y.o. female  s/p laparotomy, excision of greater omental mass, abdominal wall reconstruction with [name],appendectomy, and placement of Prevena vac.  Glucose / Insulin: no insulin requirements prior to TPN --insulin requirements previous 24h: 16 units in addition to Levemir 10 units bid and 30 units of regular insulin in TPN bag(BG 263-308) Electrolytes: hypophosphatemia Renal: Scr 0.42>1.29>0.52>0.46-->0.4 Hepatic: LFTs wnl Intake / Output; MIVF: 0.9 %  sodium chloride infusion at 25 mL/hr / (-5.4L) this admit GI Imaging: GI Surgeries / Procedures: s/p laparotomy, excision of greater omental mass, abdominal wall reconstruction with [name],appendectomy, and placement of Prevena vac  Central access: 02/15/22 TPN start date: 02/15/22  Nutritional Goals: Goal TPN rate is 75 mL/hr (provides 110 g of protein and 2000 kcals per day)  RD Assessment:  Estimated Needs Total Energy Estimated Needs: 1800-2100kcal/day Total Protein Estimated Needs: 90-110g/d Total Fluid Estimated Needs: 1.4-1.6L/day  Current Nutrition:  NPO  Plan:  Continue TPN at goal rate of 55m/hr Nutritional Components Amino acids (using 15% Clinisol): 110 grams Dextrose: 282.6 grams Lipids (using 20% SMOFlipids): 60 grams kCal: 2000/24h  Electrolytes in TPN (standard): Na 563m/L, K 5073mL, Ca 5mE42m, Mg 10mE42m and Phos 25 mmol/L. Cl:Ac 1:1 Add standard MVI and trace elements to TPN Added insulin 40 units to TPN Increasing to moderate q6h SSI and adjust as needed, levemir 10 units BID  Continue MIVF to 25 mL/hr  Monitor TPN labs on Mon/Thurs, daily until stable  KishaOswald HillockrmD Clinical  Pharmacist 02/22/2022 9:16 AM

## 2022-02-23 ENCOUNTER — Inpatient Hospital Stay: Payer: Medicaid Other

## 2022-02-23 LAB — GLUCOSE, CAPILLARY
Glucose-Capillary: 100 mg/dL — ABNORMAL HIGH (ref 70–99)
Glucose-Capillary: 122 mg/dL — ABNORMAL HIGH (ref 70–99)
Glucose-Capillary: 148 mg/dL — ABNORMAL HIGH (ref 70–99)
Glucose-Capillary: 148 mg/dL — ABNORMAL HIGH (ref 70–99)
Glucose-Capillary: 158 mg/dL — ABNORMAL HIGH (ref 70–99)
Glucose-Capillary: 161 mg/dL — ABNORMAL HIGH (ref 70–99)

## 2022-02-23 LAB — CULTURE, BLOOD (ROUTINE X 2)
Culture: NO GROWTH
Culture: NO GROWTH
Special Requests: ADEQUATE
Special Requests: ADEQUATE

## 2022-02-23 LAB — CBC
HCT: 26.1 % — ABNORMAL LOW (ref 36.0–46.0)
Hemoglobin: 8.4 g/dL — ABNORMAL LOW (ref 12.0–15.0)
MCH: 28.8 pg (ref 26.0–34.0)
MCHC: 32.2 g/dL (ref 30.0–36.0)
MCV: 89.4 fL (ref 80.0–100.0)
Platelets: 186 10*3/uL (ref 150–400)
RBC: 2.92 MIL/uL — ABNORMAL LOW (ref 3.87–5.11)
RDW: 15.3 % (ref 11.5–15.5)
WBC: 13.9 10*3/uL — ABNORMAL HIGH (ref 4.0–10.5)
nRBC: 0 % (ref 0.0–0.2)

## 2022-02-23 LAB — PHOSPHORUS: Phosphorus: 2.7 mg/dL (ref 2.5–4.6)

## 2022-02-23 MED ORDER — TRACE MINERALS CU-MN-SE-ZN 300-55-60-3000 MCG/ML IV SOLN
INTRAVENOUS | Status: AC
  Filled 2022-02-23: qty 733.2

## 2022-02-23 MED ORDER — IOHEXOL 9 MG/ML PO SOLN
500.0000 mL | ORAL | Status: AC
  Administered 2022-02-23: 500 mL

## 2022-02-23 MED ORDER — IOHEXOL 300 MG/ML  SOLN
100.0000 mL | Freq: Once | INTRAMUSCULAR | Status: AC | PRN
Start: 2022-02-23 — End: 2022-02-23
  Administered 2022-02-23: 100 mL via INTRAVENOUS

## 2022-02-23 MED ORDER — LEVOTHYROXINE SODIUM 100 MCG/5ML IV SOLN
100.0000 ug | Freq: Every day | INTRAVENOUS | Status: DC
  Administered 2022-02-23 – 2022-03-17 (×23): 100 ug via INTRAVENOUS
  Filled 2022-02-23 (×23): qty 5

## 2022-02-23 MED ORDER — LEVOTHYROXINE SODIUM 100 MCG/5ML IV SOLN: 100.0000 ug | Freq: Every day | INTRAVENOUS | Status: DC

## 2022-02-23 MED ORDER — IOHEXOL 9 MG/ML PO SOLN: 500.0000 mL | ORAL | Status: DC

## 2022-02-23 NOTE — Progress Notes (Signed)
PHARMACY - TOTAL PARENTERAL NUTRITION CONSULT NOTE   Indication: Prolonged ileus  Patient Measurements: Height: '4\' 11"'$  (149.9 cm) Weight: 89.4 kg (197 lb 1.5 oz) IBW/kg (Calculated) : 43.2 TPN AdjBW (KG): 54.7 Body mass index is 39.81 kg/m.  Assessment:  56 y.o. female  s/p laparotomy, excision of greater omental mass, abdominal wall reconstruction with [name],appendectomy, and placement of Prevena vac.  Glucose / Insulin: no insulin requirements prior to TPN --insulin requirements previous 24h: 10 units in addition to Levemir 10 units bid and 30 units of regular insulin in TPN bag(BG 263-308) Electrolytes: hypophosphatemia Renal: Scr 0.42>1.29>0.52>0.46-->0.4 Hepatic: LFTs wnl Intake / Output; MIVF: 0.9 %  sodium chloride infusion at 25 mL/hr / (-5.4L) this admit GI Imaging: GI Surgeries / Procedures: s/p laparotomy, excision of greater omental mass, abdominal wall reconstruction with [name],appendectomy, and placement of Prevena vac  Central access: 02/15/22 TPN start date: 02/15/22  Nutritional Goals: Goal TPN rate is 75 mL/hr (provides 110 g of protein and 2000 kcals per day)  RD Assessment:  Estimated Needs Total Energy Estimated Needs: 1800-2100kcal/day Total Protein Estimated Needs: 90-110g/d Total Fluid Estimated Needs: 1.4-1.6L/day  Current Nutrition:  NPO  Plan:  Continue TPN at goal rate of 39m/hr Nutritional Components Amino acids (using 15% Clinisol): 110 grams Dextrose: 282.6 grams Lipids (using 20% SMOFlipids): 60 grams kCal: 2000/24h  Electrolytes in TPN (standard): Na 511m/L, K 5032mL, Ca 5mE80m, Mg 10mE50m and Phos 25 mmol/L. Cl:Ac 1:1 Add standard MVI and trace elements to TPN Added insulin 40 units to TPN Increasing to moderate q6h SSI and adjust as needed, levemir 10 units BID  Continue MIVF to 25 mL/hr  Monitor TPN labs on Mon/Thurs, daily until stable  KishaOswald HillockrmD Clinical  Pharmacist 02/23/2022 9:32 AM

## 2022-02-23 NOTE — Progress Notes (Signed)
02/23/2022  Subjective: Patient is 8 Days Post-Op.  No acute events overnight. The patient had a temperature of 100 yesterday around 8 pm.  Denies any worsening pain or nausea.  Denies any bowel function yet.  Her WBC this morning is starting to improve, down to 13.9.  Stable Hgb.  Vital signs: Temp:  [97.9 F (36.6 C)-100 F (37.8 C)] 97.9 F (36.6 C) (06/18 0848) Pulse Rate:  [90-96] 90 (06/18 0848) Resp:  [20] 20 (06/18 0416) BP: (112-119)/(66-70) 119/70 (06/18 0848) SpO2:  [92 %-97 %] 97 % (06/18 0848) Weight:  [89.4 kg] 89.4 kg (06/18 0416)   Intake/Output: 06/17 0701 - 06/18 0700 In: 2073.9 [I.V.:987.4; IV Piggyback:1066.5] Out: 690 [Urine:450; Drains:240] Last BM Date : 02/12/22  Physical Exam: Constitutional: No acute distress Abdomen: Soft, nondistended, with appropriate tenderness to palpation.  However, there's a change in her drain output, with the right drain that is intra-abdominal now having bilious output when it was serosanguinous yesterday.  The left drain which is in retrorectus space is same seropurulent/fat necrosis type of fluid.  Wound vac in place with good seal, and similar seropurulent fluid, not bilious like the right drain.  Labs:  Recent Labs    02/22/22 0645 02/23/22 0500  WBC 16.6* 13.9*  HGB 8.7* 8.4*  HCT 26.6* 26.1*  PLT 177 186   Recent Labs    02/21/22 0413 02/22/22 0645  NA 137 139  K 4.1 3.9  CL 109 111  CO2 24 24  GLUCOSE 249* 204*  BUN 17 16  CREATININE 0.44 0.31*  CALCIUM 8.0* 7.9*   No results for input(s): "LABPROT", "INR" in the last 72 hours.  Imaging: No results found.  Assessment/Plan: This is a 56 y.o. female s/p excision of omental mass with abdominal wall reconstruction on 02/13/2022 followed by takeback on 02/15/2022 for repair of small bowel perforation.  -Unfortunately the right sided drain which is intra-abdominal is now having bilious output, suggestive of a leak or fistula.  Will continue NPO diet with TPN  nutrition, and will obtain CT scan of abdomen/pelvis to evaluate for a leak or abscess.  Interestingly, her WBC improved today. --Otherwise continue monitoring drain output.  If truly a leak would also be adding octreotide to decrease output.   --Wound vac change tomorrow. --Continue meropenem, vanco, and diflucan.   Melvyn Neth, [facility]

## 2022-02-24 ENCOUNTER — Inpatient Hospital Stay: Payer: Medicaid Other

## 2022-02-24 DIAGNOSIS — K632 Fistula of intestine: Secondary | ICD-10-CM

## 2022-02-24 LAB — CBC
HCT: 24.9 % — ABNORMAL LOW (ref 36.0–46.0)
Hemoglobin: 8.1 g/dL — ABNORMAL LOW (ref 12.0–15.0)
MCH: 29 pg (ref 26.0–34.0)
MCHC: 32.5 g/dL (ref 30.0–36.0)
MCV: 89.2 fL (ref 80.0–100.0)
Platelets: 202 10*3/uL (ref 150–400)
RBC: 2.79 MIL/uL — ABNORMAL LOW (ref 3.87–5.11)
RDW: 15.1 % (ref 11.5–15.5)
WBC: 8.3 10*3/uL (ref 4.0–10.5)
nRBC: 0.2 % (ref 0.0–0.2)

## 2022-02-24 LAB — COMPREHENSIVE METABOLIC PANEL
ALT: 27 U/L (ref 0–44)
AST: 33 U/L (ref 15–41)
Albumin: 2.1 g/dL — ABNORMAL LOW (ref 3.5–5.0)
Alkaline Phosphatase: 92 U/L (ref 38–126)
Anion gap: 6 (ref 5–15)
BUN: 16 mg/dL (ref 6–20)
CO2: 22 mmol/L (ref 22–32)
Calcium: 7.5 mg/dL — ABNORMAL LOW (ref 8.9–10.3)
Chloride: 107 mmol/L (ref 98–111)
Creatinine, Ser: <0.3 mg/dL — ABNORMAL LOW (ref 0.44–1.00)
Glucose, Bld: 163 mg/dL — ABNORMAL HIGH (ref 70–99)
Potassium: 4 mmol/L (ref 3.5–5.1)
Sodium: 135 mmol/L (ref 135–145)
Total Bilirubin: 0.8 mg/dL (ref 0.3–1.2)
Total Protein: 5.9 g/dL — ABNORMAL LOW (ref 6.5–8.1)

## 2022-02-24 LAB — VANCOMYCIN, PEAK: Vancomycin Pk: <4 ug/mL — ABNORMAL LOW (ref 30–40)

## 2022-02-24 LAB — MAGNESIUM: Magnesium: 2.2 mg/dL (ref 1.7–2.4)

## 2022-02-24 LAB — GLUCOSE, CAPILLARY
Glucose-Capillary: 182 mg/dL — ABNORMAL HIGH (ref 70–99)
Glucose-Capillary: 167 mg/dL — ABNORMAL HIGH (ref 70–99)
Glucose-Capillary: 131 mg/dL — ABNORMAL HIGH (ref 70–99)
Glucose-Capillary: 113 mg/dL — ABNORMAL HIGH (ref 70–99)
Glucose-Capillary: 158 mg/dL — ABNORMAL HIGH (ref 70–99)

## 2022-02-24 LAB — TRIGLYCERIDES: Triglycerides: 87 mg/dL (ref <150)

## 2022-02-24 LAB — PHOSPHORUS: Phosphorus: 2.7 mg/dL (ref 2.5–4.6)

## 2022-02-24 MED ORDER — OCTREOTIDE ACETATE 100 MCG/ML IJ SOLN
100.0000 ug | Freq: Three times a day (TID) | INTRAMUSCULAR | Status: DC
  Administered 2022-02-24 (×3): 100 ug via SUBCUTANEOUS
  Filled 2022-02-24 (×4): qty 1

## 2022-02-24 MED ORDER — TRACE MINERALS CU-MN-SE-ZN 300-55-60-3000 MCG/ML IV SOLN
INTRAVENOUS | Status: AC
  Filled 2022-02-24: qty 733.2

## 2022-02-24 MED ORDER — ENOXAPARIN SODIUM 40 MG/0.4ML IJ SOSY
40.0000 mg | PREFILLED_SYRINGE | INTRAMUSCULAR | Status: DC
Start: 2022-02-25 — End: 2022-02-25

## 2022-02-24 NOTE — Progress Notes (Signed)
Midline Wound   Patient had her wound vac removed today, Monday June 19th. Her incision is now to be dressed with a wet to dry dressing, cover with ABD's, and paper tape. Patient has had copious amount of green drainage from her abdominal wound, and I have had to change her dressing twice my shift. Patient's left JP drain currently will not hold suction. Doctor and charge nurse notified of this change.

## 2022-02-24 NOTE — Consult Note (Signed)
Pharmacy Antibiotic Note  Debra [name] is a 56 y.o. female  s/p laparotomy, excision of greater omental mass, abdominal wall reconstruction with [name],appendectomy, and placement of Prevena vac. Pt febrile to 101.1 overnight. Pharmacy has been consulted for vancomycin and fluconazole dosing for intra-abdominal infection.   Renal function remains stable. WBC continues to trend down. ID team following  Plan:  Meropenem 1g IV Q 8 hours  Continue Vancomycin to 1250 mg IV q24h - will order Vancomycin peak for 2100 this evening after dose is given and vanc trough tomorrow at 1730 prior to the next dose being given and adjust dose as appropriate.   Patient is also on Fluconazole 400 mg IV q24h   Monitor clinical picture, renal function, and vancomycin levels at steady state F/U C&S, abx deescalation / LOT    Height: '4\' 11"'$  (149.9 cm) Weight: 89.4 kg (197 lb 1.5 oz) IBW/kg (Calculated) : 43.2  Temp (24hrs), Avg:98.2 F (36.8 C), Min:98.1 F (36.7 C), Max:98.2 F (36.8 C)  Recent Labs  Lab 02/19/22 0554 02/20/22 0338 02/21/22 0413 02/21/22 0800 02/22/22 0645 02/23/22 0500 02/24/22 0535  WBC 11.8* 13.8*  --  16.2* 16.6* 13.9* 8.3  CREATININE 0.38* 0.40* 0.44  --  0.31*  --  <0.30*     CrCl cannot be calculated (This lab value cannot be used to calculate CrCl because it is not a number: <0.30).    No Known Allergies  Antimicrobials this admission: 6/10 Zosyn >> 6/16 6/14 Vancomycin >>  6/14 Fluconazole >>  6/16 Meropenem >>  Dose adjustments this admission:   Microbiology results: 6/12 BCx: NG x 4 days  6/13 BCx: NG x 3 days 6/15 Wound cx: few candida albicans, moderate WBC(still pending)   Thank you for allowing pharmacy to be a part of this patient's care.  [name], PharmD 02/24/2022 10:47 AM

## 2022-02-24 NOTE — Progress Notes (Signed)
Nutrition Follow-up  DOCUMENTATION CODES:   Obesity unspecified  INTERVENTION:   Continue TPN per pharmacy   Daily weights   NUTRITION DIAGNOSIS:   Increased nutrient needs related to wound healing, catabolic illness as evidenced by estimated needs.  GOAL:   Patient will meet greater than or equal to 90% of their needs -met with TPN   MONITOR:   Diet advancement, Labs, Weight trends, Skin, I & O's, Other (Comment) (TPN)  ASSESSMENT:   56 y/o female with h/o hypothyroidism, COVID 19 (3/21), kidney stones and stage 3 colon cancer (s/p left hemicolectomy 5/21 and chemotherapy) who is admitted for new pelvic mass now s/p laparotomy 6/8 (with excision of pelvic mass from greater omentum, abdominal wall reconstruction with bilateral myocutaneous flaps and mesh, incisional hernia repair, appendectomy repair and VAC placement) complicated by bowel perforation s/p reopening of recent laparotomy 6/10 (with repair of small bowel perforation, excision of mesh, placement of two phasix mesh and VAC placement). Pathology returned as metastatic adenocarcinoma.   Pt with concerns for possible EC fistula. Pt remains NPO. Pt tolerating TPN at goal rate. Refeed labs stable. Hyperglycemia improved with addition of insulin in TPN. Triglycerides wnl. Per chart, pt appears weight stable since admission. Wound vac discontinued given dehiscence and possible fistula. Octreotide initiated. NGT in place with 776m output. Per MD note, pt may require TPN for an extended length of time.   Medications reviewed and include: insulin, synthroid, protonix, NaCl _0 /hr, diflucan, meropenem  Labs reviewed: K 4.0 wnl, creat <0.30(L), P 2.7 wnl, Mg 2.2 wnl Triglycerides 87 Hgb 8.1(L), Hct 24.9(L) Cbgs- 131, 167, 182 x 24 hrs  - Left drain (Retro Rectus) 210 ccs; seropurulent - Right Drain (Intraperitoneal) 50 ccs; seropurulent  Diet Order:   Diet Order             Diet NPO time specified  Diet effective now                   EDUCATION NEEDS:   Not appropriate for education at this time  Skin:  Skin Assessment: Reviewed RN Assessment (midline wound 12 x 7 x 3)  Last BM:  6/7  Height:   Ht Readings from Last 1 Encounters:  02/13/22 _1  (1.499 m)    Weight:   Wt Readings from Last 1 Encounters:  02/23/22 89.4 kg    Ideal Body Weight:  44.3 kg  BMI:  Body mass index is 39.81 kg/m.  Estimated Nutritional Needs:   Kcal:  1800-2100kcal/day  Protein:  90-110g/d  Fluid:  1.4-1.6L/day  CKoleen DistanceMS, RD, LDN Please refer to [facility] Incfor RD and/or RD on-call/weekend/after hours pager

## 2022-02-24 NOTE — Progress Notes (Addendum)
[place] Hospital Day(s): 11.   Post op day(s): 9 Days Post-Op.   Interval History:  Patient seen and examined No fever last 24 hours Resting in bed; some right sided abdominal soreness No nausea, emesis Leukocytosis resolved; 8.3K Hgb stable at 8.1 Renal function normal; sCr - <0.30; UO - unmeasured No electrolyte derangements NGT replaced overnight; 750ccs out in last 24 hours Peripheral Bcx and Bcx from port are without growth She is on Vancomycin, Meropenem, and Diflucan; ID brought on board  Surgical Drains:   - Left drain (Retro Rectus) 210 ccs; seropurulent  - Right Drain (Intraperitoneal) 50 ccs; seropurulent Cx from LLQ drain with Candida albicans Midline Wound vac; 450; seropurulent She is NPO; on TPN  Pathology reviewed:  -- Metastatic adenocarcinoma; colon origin -- Plan for output follow up with oncology (Dr Tasia Catchings)  Vital signs in last 24 hours: [min-max] current  Temp:  [97.9 F (36.6 C)-98.1 F (36.7 C)] 98.1 F (36.7 C) (06/18 2004) Pulse Rate:  [90-94] 94 (06/18 2006) Resp:  [18] 18 (06/18 2006) BP: (115-119)/(68-70) 115/68 (06/18 2006) SpO2:  [97 %] 97 % (06/18 2006)     Height: '4\' 11"'$  (149.9 cm) Weight: 89.4 kg BMI (Calculated): 39.79   Intake/Output last 2 shifts:  06/18 0701 - 06/19 0700 In: 819.6 [I.V.:819.6] Out: 1460 [Emesis/NG output:750; Drains:710]   Physical Exam:  Constitutional: alert, cooperative and no distress  HEENT: NGT in place; output bilious  Respiratory: breathing non-labored at rest  Cardiovascular: regular rate and sinus rhythm  Gastrointestinal: Soft, abdominal soreness on the right, non-distended, no rebound/guarding. Surgical drain in LLQ and RLQ are now both seropurulent. Integumentary: Midline wound with wound vac, removed this at bedside.  Unfortunately, it appears the superior portion of the anterior fascia has dehisced with exposed mesh and seropurulent vs bilious drainage in  this area. Inferior portion intact, some pooling in dependent portion of the wound. Subcutaneous tissues are starting to granulate appropriately.    Midline Wound (02/24/2022):    Labs:     Latest Ref Rng & Units 02/24/2022    5:35 AM 02/23/2022    5:00 AM 02/22/2022    6:45 AM  CBC  WBC 4.0 - 10.5 K/uL 8.3  13.9  16.6   Hemoglobin 12.0 - 15.0 g/dL 8.1  8.4  8.7   Hematocrit 36.0 - 46.0 % 24.9  26.1  26.6   Platelets 150 - 400 K/uL 202  186  177       Latest Ref Rng & Units 02/24/2022    5:35 AM 02/22/2022    6:45 AM 02/21/2022    4:13 AM  CMP  Glucose 70 - 99 mg/dL 163  204  249   BUN 6 - 20 mg/dL '16  16  17   '$ Creatinine 0.44 - 1.00 mg/dL <0.30  0.31  0.44   Sodium 135 - 145 mmol/L 135  139  137   Potassium 3.5 - 5.1 mmol/L 4.0  3.9  4.1   Chloride 98 - 111 mmol/L 107  111  109   CO2 22 - 32 mmol/L '22  24  24   '$ Calcium 8.9 - 10.3 mg/dL 7.5  7.9  8.0   Total Protein 6.5 - 8.1 g/dL 5.9     Total Bilirubin 0.3 - 1.2 mg/dL 0.8     Alkaline Phos 38 - 126 U/L 92     AST 15 - 41 U/L 33     ALT 0 - 44  U/L 27        Imaging studies:   CT Abdomen/Pelvis (02/23/2022) personally reviewed without gross intra-abdominal complications, no free air, no gross abscess, stable drain positioning, and radiologist report reviewed below:  IMPRESSION: Stable postop changes from left colectomy, with probable edema at the site of colonic anastomosis. No evidence of abscess or free intraperitoneal air.   Stable diffuse small bowel dilatation, likely due to postop ileus.   Resolution of small pleural effusions since prior study.   Small hiatal hernia.   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   - Wound Care (06/19): Wound  vac discontinued given dehiscence and possible fistula. Changed to wet to dry dressing; change daily and as needed.  - Start octreotide; 100 mcg/min TID  - She will need to continue NPO; likely for extended time; continue TPN  - Monitor leukocytosis; resolved  - Continue NGT to LIS: monitor and record output  - Continue IV Abx (Vancomycin, Zosyn) and Antifungals (Diflucan); ID on board; Bcx without growth; Cx from drain pending  - Continue surgical drains; monitor and record output  - Monitor abdominal examination; on-going bowel function  - Pain control prn; antemetic prn  - Out of bed; therapies on board; recommendations are HHPT  All of the above findings and recommendations were discussed with the patient, and the medical team, and all of patient's questions were answered to her expressed satisfaction.  -- [name], PA-C [facility] 02/24/2022, 8:16 AM M-F: 7am - 4pm

## 2022-02-24 NOTE — Final Consult Note (Signed)
[place] Nurse wound follow up Patient receiving care in [facility] 227. PA-C Z. Olean Ree present at time of VAC dressing removal. Bilingual daughter present as well. When the Abdominal wound VAC was removed, the superior portion of the fascia was found to be dehisced.   There was 364m of green secretions in the VAC cannister. The decision was made [facility] moistened gauze dressings to the wound. WNewtonnurse will not follow at this time.  Please re-consult the [place] if needed.  SVal Riles RN, MSN, CWOCN, CNS-BC, pager [phone]

## 2022-02-24 NOTE — Progress Notes (Signed)
PHARMACY - TOTAL PARENTERAL NUTRITION CONSULT NOTE   Indication: Prolonged ileus  Patient Measurements: Height: '4\' 11"'$  (149.9 cm) Weight: 89.4 kg (197 lb 1.5 oz) IBW/kg (Calculated) : 43.2 TPN AdjBW (KG): 54.7 Body mass index is 39.81 kg/m.  Assessment:  56 y.o. female  s/p laparotomy, excision of greater omental mass, abdominal wall reconstruction with [name],appendectomy, and placement of Prevena vac.  Glucose / Insulin: no insulin requirements prior to TPN --insulin requirements previous 24h: 4 units in addition to Levemir 10 units bid and 40 units of regular insulin in TPN bag(BG 100-182) Electrolytes: WNL Renal: Scr 0.42>1.29>0.52>0.46-->0.4 Hepatic: LFTs wnl Intake / Output; MIVF: 0.9 %  sodium chloride infusion at 25 mL/hr / (-5.5L) this admit GI Imaging: GI Surgeries / Procedures: s/p laparotomy, excision of greater omental mass, abdominal wall reconstruction with [name],appendectomy, and placement of Prevena vac  Central access: 02/15/22 TPN start date: 02/15/22  Nutritional Goals: Goal TPN rate is 75 mL/hr (provides 110 g of protein and 2000 kcals per day)  RD Assessment:  Estimated Needs Total Energy Estimated Needs: 1800-2100kcal/day Total Protein Estimated Needs: 90-110g/d Total Fluid Estimated Needs: 1.4-1.6L/day  Current Nutrition:  NPO  Plan:  Continue TPN at goal rate of 18m/hr Nutritional Components Amino acids (using 15% Clinisol): 110 grams Dextrose: 282.6 grams Lipids (using 20% SMOFlipids): 60 grams kCal: 2000/24h  Electrolytes in TPN (standard): Na 53m/L, K 5022mL, Ca 5mE66m, Mg 10mE37m and Phos 25 mmol/L. Cl:Ac 1:1 Add standard MVI and trace elements to TPN Added insulin 40 units to TPN Continue moderate q6h SSI and adjust as needed, levemir 10 units BID  Continue MIVF to 25 mL/hr  Monitor TPN labs on Mon/Thurs, daily until stable  WalidPearla DubonnetrmD Clinical Pharmacist 02/24/2022 10:38 AM

## 2022-02-24 NOTE — Progress Notes (Signed)
PT Cancellation Note  Patient Details Name: Debra [name] MRN: [id number] DOB: [birth year]   Cancelled Treatment:    Reason Eval/Treat Not Completed: Other (comment). Pt just returned to bed with Rns from [facility], per RN a lot of drainage from abdominal site and dressing changed just completed and MD notified. PT to hold further exertional activity for now.   Lieutenant Diego PT, DPT 2:55 PM,02/24/22

## 2022-02-24 NOTE — Progress Notes (Signed)
ID Pt's kids by her bed side Pt says she is okay Greenish drainage  O/e ill But not in distress NG tube bilious fluid Chest b/l air entry decreased bases Hss1s Abd- dressing- wound vac removed- JP drain had rownish greene discharge  Labs    Latest Ref Rng & Units 02/24/2022    5:35 AM 02/23/2022    5:00 AM 02/22/2022    6:45 AM  CBC  WBC 4.0 - 10.5 K/uL 8.3  13.9  16.6   Hemoglobin 12.0 - 15.0 g/dL 8.1  8.4  8.7   Hematocrit 36.0 - 46.0 % 24.9  26.1  26.6   Platelets 150 - 400 K/uL 202  186  177         Latest Ref Rng & Units 02/24/2022    5:35 AM 02/22/2022    6:45 AM 02/21/2022    4:13 AM  CMP  Glucose 70 - 99 mg/dL 163  204  249   BUN 6 - 20 mg/dL '16  16  17   '$ Creatinine 0.44 - 1.00 mg/dL <0.30  0.31  0.44   Sodium 135 - 145 mmol/L 135  139  137   Potassium 3.5 - 5.1 mmol/L 4.0  3.9  4.1   Chloride 98 - 111 mmol/L 107  111  109   CO2 22 - 32 mmol/L '22  24  24   '$ Calcium 8.9 - 10.3 mg/dL 7.5  7.9  8.0   Total Protein 6.5 - 8.1 g/dL 5.9     Total Bilirubin 0.3 - 1.2 mg/dL 0.8     Alkaline Phos 38 - 126 U/L 92     AST 15 - 41 U/L 33     ALT 0 - 44 U/L 27       Fluid culture candida albicans  Impression/recommendation  Fever likely due to Intra-abdominal infectious process secondary to small bowel perforation Patient had fecal spillage that could have led to peritonitis.JP drain has bilious/feculent drainage  Patient is currently on meropenem , vancomycin and fluconazole Candid albicans in intraperitoenal fluid culture Can dc vanco- continue with meropenem and fluconazole   Open abdominal wall surgical wound.  was Covered with wound VAC. 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  Metastatic lesion  History of colon cancer status post left hemicolectomy in May 2021    Anemia  Discussed the management with her children

## 2022-02-25 ENCOUNTER — Encounter: Payer: Self-pay | Admitting: Oncology

## 2022-02-25 LAB — GLUCOSE, CAPILLARY
Glucose-Capillary: 151 mg/dL — ABNORMAL HIGH (ref 70–99)
Glucose-Capillary: 152 mg/dL — ABNORMAL HIGH (ref 70–99)
Glucose-Capillary: 155 mg/dL — ABNORMAL HIGH (ref 70–99)
Glucose-Capillary: 160 mg/dL — ABNORMAL HIGH (ref 70–99)
Glucose-Capillary: 164 mg/dL — ABNORMAL HIGH (ref 70–99)
Glucose-Capillary: 171 mg/dL — ABNORMAL HIGH (ref 70–99)

## 2022-02-25 LAB — AEROBIC/ANAEROBIC CULTURE W GRAM STAIN (SURGICAL/DEEP WOUND): Gram Stain: NONE SEEN

## 2022-02-25 LAB — CBC
HCT: 24.8 % — ABNORMAL LOW (ref 36.0–46.0)
Hemoglobin: 7.9 g/dL — ABNORMAL LOW (ref 12.0–15.0)
MCH: 28.8 pg (ref 26.0–34.0)
MCHC: 31.9 g/dL (ref 30.0–36.0)
MCV: 90.5 fL (ref 80.0–100.0)
Platelets: 246 10*3/uL (ref 150–400)
RBC: 2.74 MIL/uL — ABNORMAL LOW (ref 3.87–5.11)
RDW: 15.3 % (ref 11.5–15.5)
WBC: 6.9 10*3/uL (ref 4.0–10.5)
nRBC: 0.3 % — ABNORMAL HIGH (ref 0.0–0.2)

## 2022-02-25 LAB — COMPREHENSIVE METABOLIC PANEL
ALT: 29 U/L (ref 0–44)
AST: 32 U/L (ref 15–41)
Albumin: 2 g/dL — ABNORMAL LOW (ref 3.5–5.0)
Alkaline Phosphatase: 87 U/L (ref 38–126)
Anion gap: 3 — ABNORMAL LOW (ref 5–15)
BUN: 16 mg/dL (ref 6–20)
CO2: 26 mmol/L (ref 22–32)
Calcium: 7.5 mg/dL — ABNORMAL LOW (ref 8.9–10.3)
Chloride: 108 mmol/L (ref 98–111)
Creatinine, Ser: 0.36 mg/dL — ABNORMAL LOW (ref 0.44–1.00)
GFR, Estimated: >60 mL/min (ref >60)
Glucose, Bld: 159 mg/dL — ABNORMAL HIGH (ref 70–99)
Potassium: 4.1 mmol/L (ref 3.5–5.1)
Sodium: 137 mmol/L (ref 135–145)
Total Bilirubin: 0.8 mg/dL (ref 0.3–1.2)
Total Protein: 5.9 g/dL — ABNORMAL LOW (ref 6.5–8.1)

## 2022-02-25 LAB — PROTIME-INR
INR: 1.3 — ABNORMAL HIGH (ref 0.8–1.2)
Prothrombin Time: 15.7 seconds — ABNORMAL HIGH (ref 11.4–15.2)

## 2022-02-25 MED ORDER — PANTOPRAZOLE 80MG IVPB - SIMPLE MED
80.0000 mg | Freq: Once | INTRAVENOUS | Status: AC
  Administered 2022-02-25: 80 mg via INTRAVENOUS
  Filled 2022-02-25: qty 100

## 2022-02-25 MED ORDER — ACETAMINOPHEN 10 MG/ML IV SOLN
1000.0000 mg | Freq: Four times a day (QID) | INTRAVENOUS | Status: AC
  Administered 2022-02-25 (×3): 1000 mg via INTRAVENOUS
  Filled 2022-02-25 (×4): qty 100

## 2022-02-25 MED ORDER — TRACE MINERALS CU-MN-SE-ZN 300-55-60-3000 MCG/ML IV SOLN
INTRAVENOUS | Status: AC
  Filled 2022-02-25: qty 733.2

## 2022-02-25 MED ORDER — PANTOPRAZOLE INFUSION (NEW) - SIMPLE MED
8.0000 mg/h | INTRAVENOUS | Status: AC
  Administered 2022-02-25 – 2022-02-28 (×5): 8 mg/h via INTRAVENOUS
  Filled 2022-02-25 (×6): qty 100

## 2022-02-25 MED ORDER — OCTREOTIDE ACETATE 100 MCG/ML IJ SOLN
200.0000 ug | Freq: Three times a day (TID) | INTRAMUSCULAR | Status: DC
  Administered 2022-02-25 – 2022-03-17 (×56): 200 ug via SUBCUTANEOUS
  Filled 2022-02-25 (×63): qty 2

## 2022-02-25 MED ORDER — PANTOPRAZOLE SODIUM 40 MG IV SOLR: 40.0000 mg | Freq: Once | INTRAVENOUS | Status: DC

## 2022-02-25 MED ORDER — PANTOPRAZOLE SODIUM 40 MG IV SOLR
40.0000 mg | Freq: Two times a day (BID) | INTRAVENOUS | Status: DC
  Administered 2022-02-28 – 2022-03-17 (×34): 40 mg via INTRAVENOUS
  Filled 2022-02-25 (×34): qty 10

## 2022-02-25 NOTE — Progress Notes (Signed)
PT Cancellation Note  Patient Details Name: Debra [name] MRN: [id number] DOB: 1965-10-09   Cancelled Treatment:    Reason Eval/Treat Not Completed: Other (comment). Per chart and RN, pt with [name] pouch placed this AM, but still experiencing significant drainage including blood with mobility attempts to [facility]. PT to hold at this time and re-attempt as able.   Lieutenant Diego PT, DPT 3:35 PM,02/25/22

## 2022-02-25 NOTE — Consult Note (Signed)
[place] Nurse Consult Note: Patient receiving care in [facility] 227. PA-C Z. Olean Ree in room for [employer] placement. Reason for Consult: Assistance with large [name] pouch placement to abdominal wound with high output fistula. A large [name] pouch Kellie Simmering [phone]) was placed for drainage management to wall suction by Z. Olean Ree and myself.  The patient tolerated well. Her daughter was present and understands the purpose of the pouch.  These pouches can stay on up to 10 days.  [place] nurse will not follow at this time.  Please re-consult the [place] team if needed.  Val Riles, RN, MSN, CWOCN, CNS-BC, pager [phone]

## 2022-02-25 NOTE — Progress Notes (Signed)
Contacted Dr. Dahlia Byes about patient. Patient has been leaking bile from abdominal wound and has needed 3 dressing changes throughout the night. What really concerns me is that when I just got her up to use the bedside commode, she began to bleed and the L-JP drain that will not hold a charge immediately filled with blood that clotted off. Patient denies feeling pain or dizziness. I got her back into bed and changed her dressings and assessed some clots on her wound. Cleaned her wound and redressed it-no active bleeding from wound currently. It seems like when she places pressure on area with movement, it may be opening area more. Family concerned that she may need a foley or purewick placed. Also, NGT now has some red sediments inside of it which may have been from the abdominal bleeding. Dr. Dahlia Byes ordered labs and PPI.

## 2022-02-25 NOTE — Progress Notes (Signed)
PHARMACY - TOTAL PARENTERAL NUTRITION CONSULT NOTE   Indication: Prolonged ileus  Patient Measurements: Height: '4\' 11"'$  (149.9 cm) Weight: 95.9 kg (211 lb 6.7 oz) IBW/kg (Calculated) : 43.2 TPN AdjBW (KG): 54.7 Body mass index is 42.7 kg/m.  Assessment:  56 y.o. female  s/p laparotomy, excision of greater omental mass, abdominal wall reconstruction with [name],appendectomy, and placement of Prevena vac.  Glucose / Insulin: no insulin requirements prior to TPN --insulin requirements previous 24h: 5 units in addition to Levemir 10 units bid and 40 units of regular insulin in TPN bag(BG 131-160) Electrolytes: WNL Renal: Scr 0.42>1.29>0.52>0.46-->0.4 Hepatic: LFTs wnl Intake / Output; MIVF: 0.9 %  sodium chloride infusion at 25 mL/hr / (-2.2L) this admit GI Imaging: GI Surgeries / Procedures: s/p laparotomy, excision of greater omental mass, abdominal wall reconstruction with [name],appendectomy, and placement of Prevena vac  Central access: 02/15/22 TPN start date: 02/15/22  Nutritional Goals: Goal TPN rate is 75 mL/hr (provides 110 g of protein and 2000 kcals per day)  RD Assessment:  Estimated Needs Total Energy Estimated Needs: 1800-2100kcal/day Total Protein Estimated Needs: 90-110g/d Total Fluid Estimated Needs: 1.4-1.6L/day  Current Nutrition:  NPO  Plan:  Continue TPN at goal rate of 44m/hr Nutritional Components Amino acids (using 15% Clinisol): 110 grams Dextrose: 282.6 grams Lipids (using 20% SMOFlipids): 60 grams kCal: 2000/24h  Electrolytes in TPN (standard): Na 568m/L, K 5066mL, Ca 5mE62m, Mg 10mE62m and Phos 25 mmol/L. Cl:Ac 1:1 Add standard MVI and trace elements to TPN Added insulin 40 units to TPN Continue moderate q6h SSI and adjust as needed, levemir 10 units BID  Continue MIVF to 25 mL/hr  Monitor TPN labs on Mon/Thurs, daily until stable  WalidPearla DubonnetrmD Clinical Pharmacist 02/25/2022 11:03 AM

## 2022-02-25 NOTE — Progress Notes (Addendum)
[place] Hospital Day(s): 12.   Post op day(s): 10 Days Post-Op.   Interval History:  Patient seen and examined Unfortunately, yesterday and throughout the weekend her drains became bilious and seropurulent, wound vac with similar drainage and this was removed. Now concerning for enterocutaneous fistula. Needing multiple dressing changes in last 24 hours. Left JP also with more clots and bleeding overnight; will not hold suction This morning, she is resting comfortably; dressing not saturated currently but this was changed at 0600 No nausea, emesis Leukocytosis remains resolved; 6.9K Hgb stable at 7.9 Renal function normal; sCr - 0.36; UO - unmeasured No electrolyte derangements NGT replaced overnight; 125ccs out in last 24 hours Peripheral Bcx and Bcx from port are without growth She is on Vancomycin, Meropenem, and Diflucan; ID brought on board  Surgical Drains:   - Left drain (Retro Rectus) 113 ccs; serosanguinous; some clotting, this no longer holds suction  - Right Drain (Intraperitoneal) 60 ccs; bilious Cx from LLQ drain with Candida albicans She is NPO; on TPN  Pathology reviewed:  -- Metastatic adenocarcinoma; colon origin -- Plan for output follow up with oncology (Dr Tasia Catchings)  Vital signs in last 24 hours: [min-max] current  Temp:  [98.2 F (36.8 C)-99.8 F (37.7 C)] 98.8 F (37.1 C) (06/20 0457) Pulse Rate:  [86-101] 98 (06/20 0457) Resp:  [16-20] 18 (06/20 0457) BP: (103-146)/(59-128) 103/59 (06/20 0457) SpO2:  [94 %-97 %] 94 % (06/20 0457) Weight:  [95.9 kg] 95.9 kg (06/20 0500)     Height: '4\' 11"'$  (149.9 cm) Weight: 95.9 kg BMI (Calculated): 42.68   Intake/Output last 2 shifts:  06/19 0701 - 06/20 0700 In: 3625.5 [I.V.:2208.8; IV Piggyback:1416.7] Out: 298 [Emesis/NG output:125; Drains:173]   Physical Exam:  Constitutional: alert, cooperative and no distress  HEENT: NGT in place; output bilious  Respiratory: breathing  non-labored at rest  Cardiovascular: regular rate and sinus rhythm  Gastrointestinal: Soft, abdominal soreness on the right, non-distended, no rebound/guarding. Surgical drain in LLQ now serosanguinous with some clots; this likely will not hold suction anymore given wound, RLQ drain output slowing; bilious Integumentary: Midline wound with bulky dressing in place; recently changed and not saturated. I will not disturb this right now. Plan to reassess later today with WOC RN to place [name] pouch. Again, from examination yesterday, it appears the superior portion of the anterior fascia has dehisced with exposed mesh now with bilious drainage consistent with EC fistula   Midline Wound (02/25/2022):     Labs:     Latest Ref Rng & Units 02/25/2022    7:04 AM 02/24/2022    5:35 AM 02/23/2022    5:00 AM  CBC  WBC 4.0 - 10.5 K/uL 6.9  8.3  13.9   Hemoglobin 12.0 - 15.0 g/dL 7.9  8.1  8.4   Hematocrit 36.0 - 46.0 % 24.8  24.9  26.1   Platelets 150 - 400 K/uL 246  202  186       Latest Ref Rng & Units 02/24/2022    5:35 AM 02/22/2022    6:45 AM 02/21/2022    4:13 AM  CMP  Glucose 70 - 99 mg/dL 163  204  249   BUN 6 - 20 mg/dL '16  16  17   '$ Creatinine 0.44 - 1.00 mg/dL <0.30  0.31  0.44   Sodium 135 - 145 mmol/L 135  139  137   Potassium 3.5 - 5.1 mmol/L 4.0  3.9  4.1  Chloride 98 - 111 mmol/L 107  111  109   CO2 22 - 32 mmol/L '22  24  24   '$ Calcium 8.9 - 10.3 mg/dL 7.5  7.9  8.0   Total Protein 6.5 - 8.1 g/dL 5.9     Total Bilirubin 0.3 - 1.2 mg/dL 0.8     Alkaline Phos 38 - 126 U/L 92     AST 15 - 41 U/L 33     ALT 0 - 44 U/L 27        Imaging studies: No new pertinent imaging studies    Assessment/Plan: 56 y.o. 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 Will re-consult WOC to help place [name] pouch with suction to hopefully better control output and minimize dressing changes.     - Will increase octreotide; 200 mcg/min TID  - She will need to continue NPO; likely for extended time; continue TPN  - Monitor leukocytosis; resolved  - Monitor H&H; Hgb stable to 7.9; INR 1.3  - Continue NGT to LIS: monitor and record output  - Continue IV Abx (Vancomycin, Zosyn) and Antifungals (Diflucan); ID on board; Bcx without growth; Cx from drain with candida albicans  - Continue surgical drains; monitor and record output  - Monitor abdominal examination; on-going bowel function  - Pain control prn; antemetic prn  - Out of bed; therapies on board; recommendations are HHPT  All of the above findings and recommendations were discussed with the patient, and the medical team, and all of patient's questions were answered to her expressed satisfaction.  -- [name], PA-C [facility] 02/25/2022, 7:29 AM M-F: 7am - 4pm

## 2022-02-25 NOTE — Plan of Care (Signed)

## 2022-02-25 NOTE — Progress Notes (Signed)
Pt Seen and examined.  I agree with Mr. Freda Jackson. Now w enterocutaneous fistula and [name] pouch containing it.  I had an extensive discussion with nursing staff.  I do think that now that the fistula is contained with a [name] pouch and  wound care will be more manageable.  The fluid from fistula is bilious combined with some serous and old purulent drainage.  There is no evidence of active bleeding currently this is a challenging situation. I do think that her best interest to continue appropriate wound care management with TPN and nutritional support.  No need for surgical intervention. Family updated and aware that this will take time

## 2022-02-25 NOTE — TOC Progression Note (Signed)
Transition of Care [facility]) - Progression Note    Patient Details  Name: Debra [name] MRN: [id number] Date of Birth: 1966/01/28  Transition of Care [facility]) CM/SW Contact  Beverly Sessions, RN Phone Number: 02/25/2022, 3:42 PM  Clinical Narrative:     Referral made to [facility] with Alvis Lemmings for charity home health RN, PT, OT  Will need to arrange charity DME closer to discharge Patient not medically ready for discharge        Expected Discharge Plan and Services                                                 Social Determinants of Health (SDOH) Interventions    Readmission Risk Interventions     No data to display

## 2022-02-25 NOTE — Plan of Care (Signed)
Skin integrity compromised due to open abdominal wound. Infection risk increased.   Problem: Education: Goal: Knowledge of General Education information will improve Description: Including pain rating scale, medication(s)/side effects and non-pharmacologic comfort measures Outcome: Progressing   Problem: Health Behavior/Discharge Planning: Goal: Ability to manage health-related needs will improve Outcome: Progressing   Problem: Clinical Measurements: Goal: Ability to maintain clinical measurements within normal limits will improve Outcome: Progressing Goal: Will remain free from infection Outcome: Progressing Goal: Diagnostic test results will improve Outcome: Progressing Goal: Respiratory complications will improve Outcome: Progressing Goal: Cardiovascular complication will be avoided Outcome: Progressing   Problem: Activity: Goal: Risk for activity intolerance will decrease Outcome: Progressing   Problem: Nutrition: Goal: Adequate nutrition will be maintained Outcome: Progressing   Problem: Coping: Goal: Level of anxiety will decrease Outcome: Progressing   Problem: Elimination: Goal: Will not experience complications related to urinary retention Outcome: Progressing   Problem: Pain Managment: Goal: General experience of comfort will improve Outcome: Progressing   Problem: Safety: Goal: Ability to remain free from injury will improve Outcome: Progressing   Problem: Education: Goal: Ability to describe self-care measures that may prevent or decrease complications (Diabetes Survival Skills Education) will improve Outcome: Progressing Goal: Individualized Educational Video(s) Outcome: Progressing   Problem: Coping: Goal: Ability to adjust to condition or change in health will improve Outcome: Progressing   Problem: Fluid Volume: Goal: Ability to maintain a balanced intake and output will improve Outcome: Progressing   Problem: Health Behavior/Discharge  Planning: Goal: Ability to identify and utilize available resources and services will improve Outcome: Progressing Goal: Ability to manage health-related needs will improve Outcome: Progressing   Problem: Metabolic: Goal: Ability to maintain appropriate glucose levels will improve Outcome: Progressing   Problem: Nutritional: Goal: Maintenance of adequate nutrition will improve Outcome: Progressing Goal: Progress toward achieving an optimal weight will improve Outcome: Progressing   Problem: Tissue Perfusion: Goal: Adequacy of tissue perfusion will improve Outcome: Progressing

## 2022-02-26 ENCOUNTER — Telehealth: Payer: Self-pay

## 2022-02-26 LAB — GLUCOSE, CAPILLARY
Glucose-Capillary: 114 mg/dL — ABNORMAL HIGH (ref 70–99)
Glucose-Capillary: 146 mg/dL — ABNORMAL HIGH (ref 70–99)
Glucose-Capillary: 152 mg/dL — ABNORMAL HIGH (ref 70–99)
Glucose-Capillary: 160 mg/dL — ABNORMAL HIGH (ref 70–99)

## 2022-02-26 MED ORDER — TRACE MINERALS CU-MN-SE-ZN 300-55-60-3000 MCG/ML IV SOLN
INTRAVENOUS | Status: AC
  Filled 2022-02-26: qty 733.2

## 2022-02-26 MED ORDER — SUCRALFATE 1 G PO TABS
1.0000 g | ORAL_TABLET | Freq: Four times a day (QID) | ORAL | Status: DC
Start: 2022-02-26 — End: 2022-02-27
  Administered 2022-02-26 – 2022-02-27 (×4): 1 g
  Filled 2022-02-26 (×4): qty 1

## 2022-02-26 NOTE — Progress Notes (Signed)
[place] Hospital Day(s): [place] op day(s): 11 Days Post-Op.   Interval History:  Patient seen and examined Overnight, RN reports midline wound drainage was better managed with [name] pouch This morning, she is resting comfortably; daughter reports she had a better night No nausea, emesis No new labs this morning; plan for repeat nutritional labs in AM NGT replaced overnight; 300 ccs out in last 24 hours Peripheral Bcx and Bcx from port are without growth She is on Vancomycin, Meropenem, and Diflucan; ID brought on board  Surgical Drains:   - Left drain (Retro Rectus) 80 ccs; serosanguinous; some clotting  - Right Drain (Intraperitoneal) 30 ccs; bilious [name] Pouch with 225 ccs out Cx from LLQ drain with Candida albicans She is NPO; on TPN  Pathology reviewed:  -- Metastatic adenocarcinoma; colon origin -- Plan for output follow up with oncology (Dr Tasia Catchings)  Vital signs in last 24 hours: [min-max] current  Temp:  [97.7 F (36.5 C)-98.2 F (36.8 C)] 97.7 F (36.5 C) (06/21 0752) Pulse Rate:  [75-80] 80 (06/21 0752) Resp:  [18-20] 18 (06/21 0752) BP: (87-103)/(50-59) 94/56 (06/21 0752) SpO2:  [94 %-96 %] 94 % (06/21 0752) Weight:  [94.6 kg] 94.6 kg (06/21 0514)     Height: '4\' 11"'$  (149.9 cm) Weight: 94.6 kg BMI (Calculated): 42.1   Intake/Output last 2 shifts:  06/20 0701 - 06/21 0700 In: 4647.7 [P.O.:120; I.V.:3530.3; IV Piggyback:997.4] Out: 635 [Emesis/NG output:300; Drains:335]   Physical Exam:  Constitutional: alert, cooperative and no distress  HEENT: NGT in place; output bilious  Respiratory: breathing non-labored at rest  Cardiovascular: regular rate and sinus rhythm  Gastrointestinal: Soft, abdominal soreness on the right, non-distended, no rebound/guarding. Surgical drain in LLQ now serosanguinous with some clots, RLQ drain output slowing; bilious Integumentary: Again, the superior portion of the anterior fascia has  dehisced with exposed mesh now with bilious drainage consistent with EC fistula. Subcutaneous tissues are starting to granulate. [name] Pouch in place with good seal, red rubber present to manage effluent   Labs:     Latest Ref Rng & Units 02/25/2022    7:04 AM 02/24/2022    5:35 AM 02/23/2022    5:00 AM  CBC  WBC 4.0 - 10.5 K/uL 6.9  8.3  13.9   Hemoglobin 12.0 - 15.0 g/dL 7.9  8.1  8.4   Hematocrit 36.0 - 46.0 % 24.8  24.9  26.1   Platelets 150 - 400 K/uL 246  202  186       Latest Ref Rng & Units 02/25/2022    7:04 AM 02/24/2022    5:35 AM 02/22/2022    6:45 AM  CMP  Glucose 70 - 99 mg/dL 159  163  204   BUN 6 - 20 mg/dL '16  16  16   '$ Creatinine 0.44 - 1.00 mg/dL 0.36  <0.30  0.31   Sodium 135 - 145 mmol/L 137  135  139   Potassium 3.5 - 5.1 mmol/L 4.1  4.0  3.9   Chloride 98 - 111 mmol/L 108  107  111   CO2 22 - 32 mmol/L '26  22  24   '$ Calcium 8.9 - 10.3 mg/dL 7.5  7.5  7.9   Total Protein 6.5 - 8.1 g/dL 5.9  5.9    Total Bilirubin 0.3 - 1.2 mg/dL 0.8  0.8    Alkaline Phos 38 - 126 U/L 87  92    AST 15 - 41  U/L 32  33    ALT 0 - 44 U/L 29  27       Imaging studies: No new pertinent imaging studies    Assessment/Plan: 56 y.o. 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   - Wound Care (06/21): [name] pouch in place with red rubber present to manage drainage. She had a better night. Anticipate fluid will pool in the wound bed while she is in bed. This will be a mix of serosanguinous fluid and bilious fluid. It is okay to adjust the red rubber catheter from time to time to help with drainage while in bed. This drains well while she is ambulatory and sitting up given dependent location of the drain. These pouches need changed every  7-10 days (ideally).   - Will increase octreotide; 200 mcg/min TID  - She will need to continue NPO; likely for extended time; continue TPN  - Monitor leukocytosis; resolved  - Monitor H&H; Hgb stable to 7.9; INR 1.3  - Continue NGT to LIS: monitor and record output  - Continue IV Abx (Vancomycin, Zosyn) and Antifungals (Diflucan); ID on board; Bcx without growth; Cx from drain with candida albicans  - Continue surgical drains; monitor and record output  - Monitor abdominal examination; on-going bowel function  - Pain control prn; antemetic prn  - Out of bed; therapies on board; recommendations are HHPT  All of the above findings and recommendations were discussed with the patient, and the medical team, and all of patient's questions were answered to her expressed satisfaction.  -- [name], PA-C [facility] 02/26/2022, 8:50 AM M-F: 7am - 4pm

## 2022-02-26 NOTE — Progress Notes (Signed)
Physical Therapy Treatment Patient Details Name: Debra [name] MRN: [id number] DOB: 1966-06-25 Today's Date: 02/26/2022   History of Present Illness Pt is a 56 y.o. female with PMH including hypothyroidism, iron deficient anemia, colon cancer.  Here s/p mass removal with hernia repair and appendectomy 6/8, bowel perforation repair 6/10.    PT Comments    Patient alert, flat throughout session, re-evaluation performed today and goals updated as needed. The patient was able to transfer to supine to sit with minA, extra time. Stand pivot to [facility] with handheld assist and required maxA for pericare. Extended time needed for room set up to accommodate lines/leads. But the patient was able to ambulate ~60f to the recliner with RW and CGA. Very slow and effortful for patient but able to complete. Up in recliner with all needs in reach at end of session and RN notified of patient status and in room to assess lines/leads. The patient would benefit from further skilled PT intervention to continue to progress towards goals. Recommendation remains appropriate.      Recommendations for follow up therapy are one component of a multi-disciplinary discharge planning process, led by the attending physician.  Recommendations may be updated based on patient status, additional functional criteria and insurance authorization.  Follow Up Recommendations  Skilled nursing-short term rehab (<3 hours/day) Can patient physically be transported by private vehicle: Yes   Assistance Recommended at Discharge Frequent or constant Supervision/Assistance  Patient can return home with the following Help with stairs or ramp for entrance;Assist for transportation;Assistance with cooking/housework;A lot of help with bathing/dressing/bathroom;A little help with walking and/or transfers   Equipment Recommendations  Rolling walker (2 wheels);Hospital bed;Wheelchair cushion (measurements PT);Wheelchair (measurements PT);BSC/3in1     Recommendations for Other Services       Precautions / Restrictions Precautions Precautions: Fall Restrictions Weight Bearing Restrictions: No     Mobility  Bed Mobility Overal bed mobility: Needs Assistance Bed Mobility: Supine to Sit     Supine to sit: HOB elevated, Min assist          Transfers Overall transfer level: Needs assistance Equipment used: Rolling walker (2 wheels), 1 person hand held assist Transfers: Sit to/from Stand, Bed to chair/wheelchair/BSC Sit to Stand: Min guard                Ambulation/Gait Ambulation/Gait assistance: Min guard Gait Distance (Feet): 8 Feet Assistive device: Rolling walker (2 wheels) Gait Pattern/deviations: Step-to pattern, Decreased step length - right, Decreased step length - left, Decreased stride length, Wide base of support Gait velocity: decreased         Stairs             Wheelchair Mobility    Modified Rankin (Stroke Patients Only)       Balance Overall balance assessment: Needs assistance Sitting-balance support: No upper extremity supported, Feet unsupported Sitting balance-Leahy Scale: Good       Standing balance-Leahy Scale: Good Standing balance comment: Pt utilized RW with bilat UE support. However, pt also able to very mini squat during pericare, but did fatigue                            Cognition Arousal/Alertness: Awake/alert Behavior During Therapy: WFL for tasks assessed/performed, Flat affect Overall Cognitive Status: Within Functional Limits for tasks assessed  General Comments: Pt agreeable to therapy        Exercises      General Comments        Pertinent Vitals/Pain Pain Assessment Pain Assessment: No/denies pain    Home Living                          Prior Function            PT Goals (current goals can now be found in the care plan section) Progress towards PT goals:  Progressing toward goals    Frequency    Min 2X/week      PT Plan Current plan remains appropriate    Co-evaluation              AM-PAC PT "6 Clicks" Mobility   Outcome Measure  Help needed turning from your back to your side while in a flat bed without using bedrails?: A Little Help needed moving from lying on your back to sitting on the side of a flat bed without using bedrails?: A Little Help needed moving to and from a bed to a chair (including a wheelchair)?: A Little Help needed standing up from a chair using your arms (e.g., wheelchair or bedside chair)?: A Little Help needed to walk in hospital room?: A Little Help needed climbing 3-5 steps with a railing? : A Lot 6 Click Score: 17    End of Session Equipment Utilized During Treatment: Gait belt Activity Tolerance: Patient tolerated treatment well Patient left: in chair;with call bell/phone within reach;with family/visitor present Nurse Communication: Mobility status PT Visit Diagnosis: Muscle weakness (generalized) (M62.81);Difficulty in walking, not elsewhere classified (R26.2)     Time: 3419-3790 PT Time Calculation (min) (ACUTE ONLY): 42 min  Charges:  $Therapeutic Activity: 23-37 mins                     Lieutenant Diego PT, DPT 1:25 PM,02/26/22

## 2022-02-26 NOTE — Anesthesia Postprocedure Evaluation (Signed)
Anesthesia Post Note  Patient: Debra [name]  Procedure(s) Performed: COLON RESECTION SIGMOID, open, [name], PA-C to assist (Abdomen) HERNIA REPAIR VENTRAL ADULT, open, [name], PA-C to assist (Abdomen) APPENDECTOMY (Abdomen) INSERTION OF MESH (Abdomen) REPAIR ABDOMINAL WALL (Abdomen)  Patient location during evaluation: PACU Anesthesia Type: General Level of consciousness: awake and alert Pain management: pain level controlled Vital Signs Assessment: post-procedure vital signs reviewed and stable Respiratory status: spontaneous breathing, nonlabored ventilation, respiratory function stable and patient connected to nasal cannula oxygen Cardiovascular status: blood pressure returned to baseline and stable Postop Assessment: no apparent nausea or vomiting Anesthetic complications: no   No notable events documented.   Last Vitals:  Vitals:   02/26/22 0514 02/26/22 0752  BP: (!) 98/52 (!) 94/56  Pulse: 75 80  Resp: 20 18  Temp: 36.8 C 36.5 C  SpO2: 96% 94%    Last Pain:  Vitals:   02/26/22 0752  TempSrc:   PainSc: 0-No pain                 Molli Barrows

## 2022-02-26 NOTE — Progress Notes (Signed)
PHARMACY - TOTAL PARENTERAL NUTRITION CONSULT NOTE   Indication: Prolonged ileus  Patient Measurements: Height: '4\' 11"'$  (149.9 cm) Weight: 94.6 kg (208 lb 8.9 oz) IBW/kg (Calculated) : 43.2 TPN AdjBW (KG): 54.7 Body mass index is 42.12 kg/m.  Assessment:  56 y.o. female  s/p laparotomy, excision of greater omental mass, abdominal wall reconstruction with [name],appendectomy, and placement of Prevena vac.  Glucose / Insulin: no insulin requirements prior to TPN --insulin requirements previous 24h: 9 units in addition to Levemir 10 units bid and 40 units of regular insulin in TPN bag(BG 131-160) Electrolytes: WNL Renal: Scr<1 Hepatic: LFTs wnl Intake / Output; MIVF: 0.9 %  sodium chloride infusion at 25 mL/hr / (-2.2L) this admit GI Imaging: GI Surgeries / Procedures: s/p laparotomy, excision of greater omental mass, abdominal wall reconstruction with [name],appendectomy, and placement of Prevena vac  Central access: 02/15/22 TPN start date: 02/15/22  Nutritional Goals: Goal TPN rate is 75 mL/hr (provides 110 g of protein and 2000 kcals per day)  RD Assessment:  Estimated Needs Total Energy Estimated Needs: 1800-2100kcal/day Total Protein Estimated Needs: 90-110g/d Total Fluid Estimated Needs: 1.4-1.6L/day  Current Nutrition:  NPO  Plan:  Continue TPN at goal rate of 64m/hr Nutritional Components Amino acids (using 15% Clinisol): 110 grams Dextrose: 282.6 grams Lipids (using 20% SMOFlipids): 60 grams kCal: 2000/24h  Electrolytes in TPN (standard): Na 544m/L, K 5058mL, Ca 5mE16m, Mg 10mE60m and Phos 25 mmol/L. Cl:Ac 1:1 Add standard MVI and trace elements to TPN Added insulin 40 units to TPN Continue moderate q6h SSI and adjust as needed, levemir 10 units BID  Continue MIVF to 25 mL/hr  Monitor TPN labs on Mon/Thurs, daily until stable  WalidPearla DubonnetrmD Clinical Pharmacist 02/26/2022 10:52 AM

## 2022-02-26 NOTE — Telephone Encounter (Signed)
CALLED PATIENT NO ANSWER LEFT VOICEMAIL FOR A CALL BACK ? ?

## 2022-02-26 NOTE — Progress Notes (Signed)
Responded to consult for assistance with port re-access and midline dressing. Spoke with RN. Questions answered regarding midline dressing change. Pt currently has TPN infusing through port; due to this, re-access with need to be coordinated with next TPN bag change. Primary day shift RN and day shift VAS Team member to be notified in report.

## 2022-02-27 ENCOUNTER — Telehealth: Payer: Self-pay

## 2022-02-27 LAB — COMPREHENSIVE METABOLIC PANEL
ALT: 19 U/L (ref 0–44)
AST: 25 U/L (ref 15–41)
Albumin: 2 g/dL — ABNORMAL LOW (ref 3.5–5.0)
Alkaline Phosphatase: 76 U/L (ref 38–126)
Anion gap: 4 — ABNORMAL LOW (ref 5–15)
BUN: 17 mg/dL (ref 6–20)
CO2: 25 mmol/L (ref 22–32)
Calcium: 7.7 mg/dL — ABNORMAL LOW (ref 8.9–10.3)
Chloride: 108 mmol/L (ref 98–111)
Creatinine, Ser: 0.38 mg/dL — ABNORMAL LOW (ref 0.44–1.00)
GFR, Estimated: >60 mL/min (ref >60)
Glucose, Bld: 151 mg/dL — ABNORMAL HIGH (ref 70–99)
Potassium: 4.1 mmol/L (ref 3.5–5.1)
Sodium: 137 mmol/L (ref 135–145)
Total Bilirubin: 0.5 mg/dL (ref 0.3–1.2)
Total Protein: 6 g/dL — ABNORMAL LOW (ref 6.5–8.1)

## 2022-02-27 LAB — GLUCOSE, CAPILLARY
Glucose-Capillary: 120 mg/dL — ABNORMAL HIGH (ref 70–99)
Glucose-Capillary: 127 mg/dL — ABNORMAL HIGH (ref 70–99)
Glucose-Capillary: 135 mg/dL — ABNORMAL HIGH (ref 70–99)
Glucose-Capillary: 144 mg/dL — ABNORMAL HIGH (ref 70–99)
Glucose-Capillary: 144 mg/dL — ABNORMAL HIGH (ref 70–99)
Glucose-Capillary: 152 mg/dL — ABNORMAL HIGH (ref 70–99)

## 2022-02-27 LAB — CBC
HCT: 21.7 % — ABNORMAL LOW (ref 36.0–46.0)
Hemoglobin: 6.8 g/dL — ABNORMAL LOW (ref 12.0–15.0)
MCH: 28 pg (ref 26.0–34.0)
MCHC: 31.3 g/dL (ref 30.0–36.0)
MCV: 89.3 fL (ref 80.0–100.0)
Platelets: 246 10*3/uL (ref 150–400)
RBC: 2.43 MIL/uL — ABNORMAL LOW (ref 3.87–5.11)
RDW: 15 % (ref 11.5–15.5)
WBC: 4.6 10*3/uL (ref 4.0–10.5)
nRBC: 1.1 % — ABNORMAL HIGH (ref 0.0–0.2)

## 2022-02-27 LAB — TRIGLYCERIDES: Triglycerides: 88 mg/dL (ref <150)

## 2022-02-27 LAB — MAGNESIUM: Magnesium: 2.3 mg/dL (ref 1.7–2.4)

## 2022-02-27 LAB — PHOSPHORUS: Phosphorus: 2.7 mg/dL (ref 2.5–4.6)

## 2022-02-27 MED ORDER — SUCRALFATE 1 GM/10ML PO SUSP
1.0000 g | Freq: Four times a day (QID) | ORAL | Status: DC
  Administered 2022-02-27 – 2022-03-10 (×33): 1 g
  Filled 2022-02-27 (×34): qty 10

## 2022-02-27 MED ORDER — TRACE MINERALS CU-MN-SE-ZN 300-55-60-3000 MCG/ML IV SOLN
INTRAVENOUS | Status: AC
  Filled 2022-02-27: qty 733.2

## 2022-02-27 NOTE — Telephone Encounter (Signed)
CALLED PATIENT NO ANSWER LEFT VOICEMAIL FOR A CALL BACK °Letter sent °

## 2022-02-27 NOTE — Progress Notes (Signed)
PHARMACY - TOTAL PARENTERAL NUTRITION CONSULT NOTE   Indication: Prolonged ileus  Patient Measurements: Height: '4\' 11"'$  (149.9 cm) Weight: 94.6 kg (208 lb 8.9 oz) IBW/kg (Calculated) : 43.2 TPN AdjBW (KG): 54.7 Body mass index is 42.12 kg/m.  Assessment:  56 y.o. female  s/p laparotomy, excision of greater omental mass, abdominal wall reconstruction with [name],appendectomy, and placement of Prevena vac.  Glucose / Insulin: no insulin requirements prior to TPN --insulin requirements previous 24h: 2 units in addition to Levemir 10 units bid and 40 units of regular insulin in TPN bag (BG 114 - 152)  Electrolytes: WNL Renal: Scr<1 Hepatic: LFTs wnl Intake / Output; MIVF: 0.9 %  sodium chloride infusion at 25 mL/hr / (-2.2L) this admit GI Imaging: GI Surgeries / Procedures: s/p laparotomy, excision of greater omental mass, abdominal wall reconstruction with [name],appendectomy, and placement of Prevena vac  Central access: 02/15/22 TPN start date: 02/15/22  Nutritional Goals: Goal TPN rate is 75 mL/hr (provides 110 g of protein and 2000 kcals per day)  RD Assessment:  Estimated Needs Total Energy Estimated Needs: 1800-2100kcal/day Total Protein Estimated Needs: 90-110g/d Total Fluid Estimated Needs: 1.4-1.6L/day  Current Nutrition:  NPO  Plan:  Continue TPN at goal rate of 45m/hr Nutritional Components Amino acids (using 15% Clinisol): 110 grams Dextrose: 282.6 grams Lipids (using 20% SMOFlipids): 60 grams kCal: 2000/24h  Electrolytes in TPN (standard): Na 530m/L, K 5029mL, Ca 5mE46m, Mg 10mE67m and Phos 25 mmol/L. Cl:Ac 1:1 Add standard MVI and trace elements to TPN Added insulin 40 units to TPN Continue moderate q6h SSI and adjust as needed, stop levemir  stop MIVF (d/w PAC Z SchulDelena Baliitor TPN labs on Mon/Thurs  RodneDallie PilesrmD Clinical Pharmacist 02/27/2022 10:45 AM

## 2022-02-27 NOTE — Progress Notes (Signed)
Nutrition Follow-up  DOCUMENTATION CODES:   Obesity unspecified  INTERVENTION:   -TPN management per pharmacy  NUTRITION DIAGNOSIS:   Increased nutrient needs related to wound healing, catabolic illness as evidenced by estimated needs.  Ongoing  GOAL:   Patient will meet greater than or equal to 90% of their needs  Met with TPN  MONITOR:   Diet advancement, Labs, Weight trends, Skin, I & O's, Other (Comment) (TPN)  REASON FOR ASSESSMENT:   Consult New TPN/TNA  ASSESSMENT:   56 y/o female with h/o hypothyroidism, COVID 19 (3/21), kidney stones and stage 3 colon cancer (s/p left hemicolectomy 5/21 and chemotherapy) who is admitted for new pelvic mass now s/p laparotomy 6/8 (with excision of pelvic mass from greater omentum, abdominal wall reconstruction with bilateral myocutaneous flaps and mesh, incisional hernia repair, appendectomy repair and VAC placement) complicated by bowel perforation s/p reopening of recent laparotomy 6/10 (with repair of small bowel perforation, excision of mesh, placement of two phasix mesh and VAC placement).  6/20- eakin pouch placed for drainage management to wall suction 6/22- NGT d/c  Reviewed I/O's: +2.5 L x 24 hours and +4.4 L since admission  Drain output: 115 ml x 24 hours   Per general surgery notes, eakin pouch drainage working well.   Pt remains NPO, but NGT was removed for holiday. Pt continues to receive nutritional support via TPN- 75 ml/hr, which provide 2001 kcals and 110 grams protein daily, which meets 100% of needs.   Medications reviewed and include diflucan.   Labs reviewed: CBGS: 127-152 (inpatient orders for glycemic control are 0-15 units insulin aspart TID with meals and 40 units regular insulin in TPN).    Diet Order:   Diet Order             Diet NPO time specified  Diet effective now                   EDUCATION NEEDS:   Not appropriate for education at this time  Skin:  Skin Assessment: Skin  Integrity Issues: Skin Integrity Issues:: Incisions Incisions: closed abdomen  Last BM:  02/12/22  Height:   Ht Readings from Last 1 Encounters:  02/13/22 _0  (1.499 m)    Weight:   Wt Readings from Last 1 Encounters:  02/27/22 94.6 kg    Ideal Body Weight:  44.3 kg  BMI:  Body mass index is 42.12 kg/m.  Estimated Nutritional Needs:   Kcal:  1800-2100kcal/day  Protein:  90-110g/d  Fluid:  1.4-1.6L/day    Loistine Chance, RD, LDN, [place] Registered Dietitian II Certified Diabetes Care and Education Specialist Please refer to [facility] for RD and/or RD on-call/weekend/after hours pager

## 2022-02-27 NOTE — Progress Notes (Signed)
PT Cancellation Note  Patient Details Name: Debra [name] MRN: [id number] DOB: [birth year]   Cancelled Treatment:    Reason Eval/Treat Not Completed: Other (comment). Per OT, pt sleeping soundly at this time, to re-attempt as able.    Lieutenant Diego PT, DPT 2:42 PM,02/27/22

## 2022-02-28 DIAGNOSIS — K651 Peritoneal abscess: Secondary | ICD-10-CM

## 2022-02-28 LAB — GLUCOSE, CAPILLARY
Glucose-Capillary: 139 mg/dL — ABNORMAL HIGH (ref 70–99)
Glucose-Capillary: 146 mg/dL — ABNORMAL HIGH (ref 70–99)
Glucose-Capillary: 154 mg/dL — ABNORMAL HIGH (ref 70–99)
Glucose-Capillary: 157 mg/dL — ABNORMAL HIGH (ref 70–99)
Glucose-Capillary: 166 mg/dL — ABNORMAL HIGH (ref 70–99)
Glucose-Capillary: 204 mg/dL — ABNORMAL HIGH (ref 70–99)

## 2022-02-28 LAB — PREPARE RBC (CROSSMATCH)

## 2022-02-28 MED ORDER — BOOST / RESOURCE BREEZE PO LIQD CUSTOM
1.0000 | Freq: Three times a day (TID) | ORAL | Status: DC
  Administered 2022-02-28 – 2022-03-04 (×7): 1 via ORAL

## 2022-02-28 MED ORDER — TRACE MINERALS CU-MN-SE-ZN 300-55-60-3000 MCG/ML IV SOLN
INTRAVENOUS | Status: AC
  Filled 2022-02-28: qty 733.2

## 2022-02-28 MED ORDER — SODIUM CHLORIDE 0.9% IV SOLUTION: Freq: Once | INTRAVENOUS | Status: AC

## 2022-02-28 MED ORDER — ACETAMINOPHEN 500 MG PO TABS
1000.0000 mg | ORAL_TABLET | Freq: Once | ORAL | Status: AC
Start: 2022-02-28 — End: 2022-02-28
  Administered 2022-02-28: 1000 mg via ORAL
  Filled 2022-02-28: qty 2

## 2022-02-28 NOTE — Progress Notes (Signed)
PHARMACY - TOTAL PARENTERAL NUTRITION CONSULT NOTE   Indication: Prolonged ileus  Patient Measurements: Height: 4\' 11"  (149.9 cm) Weight: 90.6 kg (199 lb 11.8 oz) IBW/kg (Calculated) : 43.2 TPN AdjBW (KG): 54.7 Body mass index is 40.34 kg/m.  Assessment:  56 y.o. female  s/p laparotomy, excision of greater omental mass, abdominal wall reconstruction with [name],appendectomy, and placement of Prevena vac.  Glucose / Insulin: no insulin requirements prior to TPN --insulin requirements previous 24h: Novolog 4 units in addition to Levemir 10 units in the morning and 40 units of regular insulin in TPN bag (BG 127 - 146)  Electrolytes: WNL Renal: Scr<1 Hepatic: LFTs wnl Intake / Output; MIVF:(-2.2L) this admit/ NS stopped 6/22 GI Imaging: GI Surgeries / Procedures: s/p laparotomy, excision of greater omental mass, abdominal wall reconstruction with [name],appendectomy, and placement of Prevena vac  Central access: 02/15/22 TPN start date: 02/15/22  Nutritional Goals: Goal TPN rate is 75 mL/hr (provides 110 g of protein and 2000 kcals per day)  RD Assessment:  Estimated Needs Total Energy Estimated Needs: 1800-2100kcal/day Total Protein Estimated Needs: 90-110g/d Total Fluid Estimated Needs: 1.4-1.6L/day  Current Nutrition:  NPO  Plan:  Continue TPN at goal rate of 31mL/hr Nutritional Components Amino acids (using 15% Clinisol): 110 grams Dextrose: 282.6 grams Lipids (using 20% SMOFlipids): 60 grams kCal: 2000/24h  Electrolytes in TPN (standard): Na 1mEq/L, K 64mEq/L, Ca 35mEq/L, Mg 74mEq/L, and Phos 25 mmol/L. Cl:Ac 1:1 Add standard MVI and trace elements to TPN Added insulin 40 units to TPN Continue moderate q6h SSI and adjust as needed Monitor TPN labs on Mon/Thurs  Selinda Eon, PharmD Clinical Pharmacist 02/28/2022 8:13 AM

## 2022-02-28 NOTE — TOC Progression Note (Addendum)
Transition of Care [facility]) - Progression Note    Patient Details  Name: Debra [name] MRN: [id number] Date of Birth: 1965/10/02  Transition of Care [facility] CM/SW Contact  Chapman Fitch, RN Phone Number: 02/28/2022, 2:34 PM  Clinical Narrative:      When patient medically ready for discharge, plan remains to discharge with charity home health services through [facility]  Closer to discharge TOC to arrange, hospital bed, RW, and [facility]      Expected Discharge Plan and Services                                                 Social Determinants of Health (SDOH) Interventions    Readmission Risk Interventions     No data to display

## 2022-03-01 LAB — TYPE AND SCREEN
ABO/RH(D): O POS
Antibody Screen: NEGATIVE
Unit division: 0

## 2022-03-01 LAB — BASIC METABOLIC PANEL
Anion gap: 4 — ABNORMAL LOW (ref 5–15)
BUN: 18 mg/dL (ref 6–20)
CO2: 24 mmol/L (ref 22–32)
Calcium: 7.7 mg/dL — ABNORMAL LOW (ref 8.9–10.3)
Chloride: 105 mmol/L (ref 98–111)
Creatinine, Ser: 0.42 mg/dL — ABNORMAL LOW (ref 0.44–1.00)
GFR, Estimated: >60 mL/min (ref >60)
Glucose, Bld: 192 mg/dL — ABNORMAL HIGH (ref 70–99)
Potassium: 4.1 mmol/L (ref 3.5–5.1)
Sodium: 133 mmol/L — ABNORMAL LOW (ref 135–145)

## 2022-03-01 LAB — CBC
HCT: 29 % — ABNORMAL LOW (ref 36.0–46.0)
Hemoglobin: 9.5 g/dL — ABNORMAL LOW (ref 12.0–15.0)
MCH: 28.9 pg (ref 26.0–34.0)
MCHC: 32.8 g/dL (ref 30.0–36.0)
MCV: 88.1 fL (ref 80.0–100.0)
Platelets: 249 10*3/uL (ref 150–400)
RBC: 3.29 MIL/uL — ABNORMAL LOW (ref 3.87–5.11)
RDW: 14.6 % (ref 11.5–15.5)
WBC: 6.6 10*3/uL (ref 4.0–10.5)
nRBC: 0.9 % — ABNORMAL HIGH (ref 0.0–0.2)

## 2022-03-01 LAB — GLUCOSE, CAPILLARY
Glucose-Capillary: 233 mg/dL — ABNORMAL HIGH (ref 70–99)
Glucose-Capillary: 175 mg/dL — ABNORMAL HIGH (ref 70–99)
Glucose-Capillary: 201 mg/dL — ABNORMAL HIGH (ref 70–99)
Glucose-Capillary: 173 mg/dL — ABNORMAL HIGH (ref 70–99)

## 2022-03-01 LAB — BPAM RBC
Blood Product Expiration Date: 202306242359
ISSUE DATE / TIME: 202306231458
Unit Type and Rh: 9500

## 2022-03-01 MED ORDER — TRACE MINERALS CU-MN-SE-ZN 300-55-60-3000 MCG/ML IV SOLN
INTRAVENOUS | Status: AC
  Filled 2022-03-01: qty 733.2

## 2022-03-02 LAB — GLUCOSE, CAPILLARY
Glucose-Capillary: 208 mg/dL — ABNORMAL HIGH (ref 70–99)
Glucose-Capillary: 144 mg/dL — ABNORMAL HIGH (ref 70–99)
Glucose-Capillary: 171 mg/dL — ABNORMAL HIGH (ref 70–99)

## 2022-03-02 MED ORDER — TRACE MINERALS CU-MN-SE-ZN 300-55-60-3000 MCG/ML IV SOLN
INTRAVENOUS | Status: AC
  Filled 2022-03-02: qty 733.2

## 2022-03-02 MED ORDER — ACETAMINOPHEN 325 MG PO TABS
650.0000 mg | ORAL_TABLET | Freq: Four times a day (QID) | ORAL | Status: DC | PRN
  Administered 2022-03-02 – 2022-07-18 (×9): 650 mg via ORAL
  Filled 2022-03-02 (×9): qty 2

## 2022-03-03 LAB — GLUCOSE, CAPILLARY
Glucose-Capillary: 172 mg/dL — ABNORMAL HIGH (ref 70–99)
Glucose-Capillary: 180 mg/dL — ABNORMAL HIGH (ref 70–99)
Glucose-Capillary: 182 mg/dL — ABNORMAL HIGH (ref 70–99)
Glucose-Capillary: 190 mg/dL — ABNORMAL HIGH (ref 70–99)
Glucose-Capillary: 193 mg/dL — ABNORMAL HIGH (ref 70–99)

## 2022-03-03 LAB — CBC
HCT: 26 % — ABNORMAL LOW (ref 36.0–46.0)
Hemoglobin: 8.3 g/dL — ABNORMAL LOW (ref 12.0–15.0)
MCH: 28.5 pg (ref 26.0–34.0)
MCHC: 31.9 g/dL (ref 30.0–36.0)
MCV: 89.3 fL (ref 80.0–100.0)
Platelets: 209 10*3/uL (ref 150–400)
RBC: 2.91 MIL/uL — ABNORMAL LOW (ref 3.87–5.11)
RDW: 14.7 % (ref 11.5–15.5)
WBC: 7.7 10*3/uL (ref 4.0–10.5)
nRBC: 0.3 % — ABNORMAL HIGH (ref 0.0–0.2)

## 2022-03-03 LAB — COMPREHENSIVE METABOLIC PANEL
ALT: 25 U/L (ref 0–44)
AST: 32 U/L (ref 15–41)
Albumin: 2 g/dL — ABNORMAL LOW (ref 3.5–5.0)
Alkaline Phosphatase: 99 U/L (ref 38–126)
Anion gap: 6 (ref 5–15)
BUN: 21 mg/dL — ABNORMAL HIGH (ref 6–20)
CO2: 22 mmol/L (ref 22–32)
Calcium: 7.8 mg/dL — ABNORMAL LOW (ref 8.9–10.3)
Chloride: 105 mmol/L (ref 98–111)
Creatinine, Ser: 0.45 mg/dL (ref 0.44–1.00)
GFR, Estimated: >60 mL/min (ref >60)
Glucose, Bld: 180 mg/dL — ABNORMAL HIGH (ref 70–99)
Potassium: 4.4 mmol/L (ref 3.5–5.1)
Sodium: 133 mmol/L — ABNORMAL LOW (ref 135–145)
Total Bilirubin: 0.3 mg/dL (ref 0.3–1.2)
Total Protein: 6.4 g/dL — ABNORMAL LOW (ref 6.5–8.1)

## 2022-03-03 LAB — PHOSPHORUS: Phosphorus: 3.5 mg/dL (ref 2.5–4.6)

## 2022-03-03 LAB — TRIGLYCERIDES: Triglycerides: 63 mg/dL (ref <150)

## 2022-03-03 LAB — MAGNESIUM: Magnesium: 2.2 mg/dL (ref 1.7–2.4)

## 2022-03-03 MED ORDER — L-GLUTAMINE ORAL POWDER
10.0000 g | PACK | Freq: Two times a day (BID) | ORAL | Status: DC
  Administered 2022-03-03 – 2022-03-19 (×27): 10 g via ORAL
  Filled 2022-03-03 (×41): qty 2

## 2022-03-03 MED ORDER — ENOXAPARIN SODIUM 60 MG/0.6ML IJ SOSY
0.5000 mg/kg | PREFILLED_SYRINGE | INTRAMUSCULAR | Status: DC
Start: 2022-03-03 — End: 2022-04-19
  Administered 2022-03-03 – 2022-04-18 (×47): 45 mg via SUBCUTANEOUS
  Filled 2022-03-03 (×48): qty 0.6

## 2022-03-03 MED ORDER — TRACE MINERALS CU-MN-SE-ZN 300-55-60-3000 MCG/ML IV SOLN
INTRAVENOUS | Status: AC
  Filled 2022-03-03: qty 733.2

## 2022-03-04 ENCOUNTER — Inpatient Hospital Stay: Payer: Self-pay | Attending: Oncology | Admitting: Oncology

## 2022-03-04 LAB — GLUCOSE, CAPILLARY
Glucose-Capillary: 179 mg/dL — ABNORMAL HIGH (ref 70–99)
Glucose-Capillary: 203 mg/dL — ABNORMAL HIGH (ref 70–99)
Glucose-Capillary: 175 mg/dL — ABNORMAL HIGH (ref 70–99)

## 2022-03-04 MED ORDER — LOPERAMIDE HCL 2 MG PO CAPS
4.0000 mg | ORAL_CAPSULE | Freq: Three times a day (TID) | ORAL | Status: DC
  Administered 2022-03-04 – 2022-03-24 (×63): 4 mg via ORAL
  Filled 2022-03-04 (×64): qty 2

## 2022-03-04 MED ORDER — TRACE MINERALS CU-MN-SE-ZN 300-55-60-3000 MCG/ML IV SOLN
INTRAVENOUS | Status: AC
  Filled 2022-03-04: qty 733.2

## 2022-03-04 MED ORDER — ENSURE ENLIVE PO LIQD
237.0000 mL | Freq: Three times a day (TID) | ORAL | Status: DC
  Administered 2022-03-04 – 2022-03-08 (×7): 237 mL via ORAL

## 2022-03-04 MED ORDER — PIPERACILLIN-TAZOBACTAM 3.375 G IVPB
3.3750 g | Freq: Three times a day (TID) | INTRAVENOUS | Status: AC
  Administered 2022-03-04 – 2022-03-17 (×40): 3.375 g via INTRAVENOUS
  Filled 2022-03-04 (×40): qty 50

## 2022-03-04 NOTE — Consult Note (Signed)
Pharmacy Antibiotic Note  Debra [name] is a 56 y.o. female with PMH including colon cancer admitted on 02/13/2022 for excision of pelvic mass from greater omentum, abdominal wall reconstruction, recurrent incisional hernia repair, and appendectomy. Patient returned to OR 6/10 for reopening of laparotomy for repair of small bowel perforation. ID has been following for intraabdominal abscesses secondary to bowel perforation. Pharmacy has been consulted for Zosyn dosing.  Plan: Zosyn 3.375g IV q8h (4 hour infusion).  Height: 4\' 11"  (149.9 cm) Weight: 91.2 kg (201 lb 1 oz) (Simultaneous filing. User may not have seen previous data.) IBW/kg (Calculated) : 43.2  Temp (24hrs), Avg:99.9 F (37.7 C), Min:98.8 F (37.1 C), Max:101.1 F (38.4 C)  Recent Labs  Lab 02/27/22 0549 03/01/22 0708 03/03/22 0644  WBC 4.6 6.6 7.7  CREATININE 0.38* 0.42* 0.45    Estimated Creatinine Clearance: 75.5 mL/min (by C-G formula based on SCr of 0.45 mg/dL).    No Known Allergies  Antimicrobials this admission: Cefotetan 6/8 >> 6/9 (surgical prophylaxis) Zosyn 6/10 >> 6/16, 6/27 >> Vancomycin 6/14 >> 6/18 Fluconazole 6/14 >> 6/27 Meropenem 6/16 >> 6/22  Dose adjustments this admission: N/A  Microbiology results: 6/12 BCx: NG 6/13 BCx: NG 6/15 LLQ drain: Candida albicans  Thank you for allowing pharmacy to be a part of this patient's care.  Debra [name] 03/04/2022 8:06 PM

## 2022-03-04 NOTE — Progress Notes (Signed)
[facility] SURGICAL PROGRESS NOTE  Hospital Day(s): 19.   Post op day(s): 17 Days Post-Op.   Interval History:  Patient seen and examined No issues overnight; last fever at 0800 (06/25) This morning, she is resting comfortably No nausea, emesis No new labs; monitoring Mon/Thurs NGT discontinued on 06/22; doing well Peripheral Bcx and Bcx from port are without growth Surgical Drains:   - Left drain (Retro Rectus) 10 ccs  - Right Drain (Intraperitoneal) 0 ccs [name] Pouch output  not measured in last 24 hours Cx from LLQ drain with Candida albicans; on Diflucan (stop day 06/27) She is on CLD; on TPN  Pathology reviewed:  -- Metastatic adenocarcinoma; colon origin -- Plan for output follow up with oncology (Dr Cathie Hoops)  Vital signs in last 24 hours: [min-max] current  Temp:  [98.8 F (37.1 C)-100.3 F (37.9 C)] 98.8 F (37.1 C) (06/27 0509) Pulse Rate:  [93-102] 93 (06/27 0509) Resp:  [18-24] 18 (06/27 0509) BP: (105-115)/(60-67) 111/64 (06/27 0509) SpO2:  [90 %-97 %] 96 % (06/27 0509) Weight:  [91.2 kg] 91.2 kg (06/27 0500)     Height: 4\' 11"  (149.9 cm) Weight: 91.2 kg (Simultaneous filing. User may not have seen previous data.) BMI (Calculated): 40.59   Intake/Output last 2 shifts:  06/26 0701 - 06/27 0700 In: 638.4 [I.V.:638.4] Out: 610 [Urine:600; Drains:10]   Physical Exam:  Constitutional: alert, cooperative and no distress  Respiratory: breathing non-labored at rest  Cardiovascular: regular rate and sinus rhythm  Gastrointestinal: Soft, abdominal soreness on the right, non-distended, no rebound/guarding. Surgical drain in LLQ; serosanguinous, RLQ drain output slowing; bilious Integumentary: Again, the superior portion of the anterior fascia has dehisced with exposed mesh now with bilious drainage consistent with EC fistula. Subcutaneous tissues are starting to granulate. [name] Pouch in place with good seal, red rubber present to manage effluent   Labs:      Latest Ref Rng & Units 03/03/2022    6:44 AM 03/01/2022    7:08 AM 02/27/2022    5:49 AM  CBC  WBC 4.0 - 10.5 K/uL 7.7  6.6  4.6   Hemoglobin 12.0 - 15.0 g/dL 8.3  9.5  6.8   Hematocrit 36.0 - 46.0 % 26.0  29.0  21.7   Platelets 150 - 400 K/uL 209  249  246       Latest Ref Rng & Units 03/03/2022    6:44 AM 03/01/2022    7:08 AM 02/27/2022    5:49 AM  CMP  Glucose 70 - 99 mg/dL 409  811  914   BUN 6 - 20 mg/dL 21  18  17    Creatinine 0.44 - 1.00 mg/dL 7.82  9.56  2.13   Sodium 135 - 145 mmol/L 133  133  137   Potassium 3.5 - 5.1 mmol/L 4.4  4.1  4.1   Chloride 98 - 111 mmol/L 105  105  108   CO2 22 - 32 mmol/L 22  24  25    Calcium 8.9 - 10.3 mg/dL 7.8  7.7  7.7   Total Protein 6.5 - 8.1 g/dL 6.4   6.0   Total Bilirubin 0.3 - 1.2 mg/dL 0.3   0.5   Alkaline Phos 38 - 126 U/L 99   76   AST 15 - 41 U/L 32   25   ALT 0 - 44 U/L 25   19      Imaging studies: No new pertinent imaging studies    Assessment/Plan: 56 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   - Wound Care: [name] pouch in place with red rubber present to manage drainage. Anticipate fluid will pool in the wound bed while she is in bed. This will be a mix of serosanguinous fluid and bilious fluid. It is okay to adjust the red rubber catheter from time to time to help with drainage while in bed. This drains well while she is ambulatory and sitting up given dependent location of the drain. These pouches need changed every 7-10 days (ideally).   - Continue CLD as tolerated + nutritional supplementation  - Continue TPN at goal; monitor electrolytes - Continue octreotide; 200 mcg/min TID  - Monitor leukocytosis; resolved  - Monitor H&H; Hgb improved  - Continue  antifungal (Diflucan) with stop date 06/27; ID on board; Bcx without growth; Cx from drain with candida albicans  - Continue surgical drains; monitor and record output  - Monitor abdominal examination; on-going bowel function  - Pain control prn; antemetic prn  - Out of bed; therapies on board; recommendations are HHPT  All of the above findings and recommendations were discussed with the patient, and the medical team, and all of patient's questions were answered to her expressed satisfaction.  -- [name], PA-C [facility] 03/04/2022, 7:38 AM M-F: 7am - 4pm

## 2022-03-05 ENCOUNTER — Inpatient Hospital Stay: Payer: Medicaid Other

## 2022-03-05 LAB — GLUCOSE, CAPILLARY
Glucose-Capillary: 156 mg/dL — ABNORMAL HIGH (ref 70–99)
Glucose-Capillary: 173 mg/dL — ABNORMAL HIGH (ref 70–99)
Glucose-Capillary: 192 mg/dL — ABNORMAL HIGH (ref 70–99)
Glucose-Capillary: 194 mg/dL — ABNORMAL HIGH (ref 70–99)
Glucose-Capillary: 204 mg/dL — ABNORMAL HIGH (ref 70–99)

## 2022-03-05 MED ORDER — ADULT MULTIVITAMIN LIQUID CH
15.0000 mL | Freq: Every day | ORAL | Status: DC
  Administered 2022-03-05 – 2022-03-30 (×25): 15 mL via ORAL
  Filled 2022-03-05 (×27): qty 15

## 2022-03-05 MED ORDER — TRACE MINERALS CU-MN-SE-ZN 300-55-60-3000 MCG/ML IV SOLN
INTRAVENOUS | Status: AC
  Filled 2022-03-05: qty 733.2

## 2022-03-05 MED ORDER — IOHEXOL 300 MG/ML  SOLN
100.0000 mL | Freq: Once | INTRAMUSCULAR | Status: AC | PRN
  Administered 2022-03-05: 100 mL via INTRAVENOUS

## 2022-03-05 NOTE — Progress Notes (Signed)
Physical Therapy Treatment Patient Details Name: Debra [name] MRN: [id number] DOB: 30-Mar-1966 Today's Date: 03/05/2022   History of Present Illness Pt is a 56 y.o. female with PMH including hypothyroidism, iron deficient anemia, colon cancer.  Here s/p mass removal with hernia repair and appendectomy 6/8, bowel perforation repair 6/10.    PT Comments    Patient is agreeable to PT treatment. Family member in the room. She participate with LE strengthening exercises in supine and sitting position. Assistance required for standing and for short distance ambulation at the bedside. Overall activity tolerance limited by fatigue and mild dizziness is reported with sitting and standing that does not worsen with activity. Recommend to continue PT to maximize functional independence and decrease caregiver burden.    Recommendations for follow up therapy are one component of a multi-disciplinary discharge planning process, led by the attending physician.  Recommendations may be updated based on patient status, additional functional criteria and insurance authorization.  Follow Up Recommendations  Skilled nursing-short term rehab (<3 hours/day) Can patient physically be transported by private vehicle: Yes   Assistance Recommended at Discharge Frequent or constant Supervision/Assistance  Patient can return home with the following Help with stairs or ramp for entrance;Assist for transportation;Assistance with cooking/housework;A lot of help with bathing/dressing/bathroom;A little help with walking and/or transfers   Equipment Recommendations  Rolling walker (2 wheels);Hospital bed;Wheelchair cushion (measurements PT);Wheelchair (measurements PT);BSC/3in1    Recommendations for Other Services       Precautions / Restrictions Precautions Precautions: Fall Precaution Comments: multiple drains Restrictions Weight Bearing Restrictions: No     Mobility  Bed Mobility Overal bed mobility:  Needs Assistance Bed Mobility: Supine to Sit, Sit to Supine   Sidelying to sit: Mod assist Supine to sit: Mod assist     General bed mobility comments: assistance for trunk and BLE support. increased time and effort required. verbal cues for technique    Transfers Overall transfer level: Needs assistance Equipment used: None Transfers: Sit to/from Stand Sit to Stand: Min assist           General transfer comment: lifting assistance required for standing verbalc ues for technique. mild dizziness reported with sitting and standing that does not worsen with activity    Ambulation/Gait Ambulation/Gait assistance: Min guard Gait Distance (Feet): 2 Feet Assistive device: None   Gait velocity: decreased     General Gait Details: patient able to take several steps along edge of bed with Min guard assistance. activity tolerance limited by fatigue with minimal activity. no reported pain with activity   Stairs             Wheelchair Mobility    Modified Rankin (Stroke Patients Only)       Balance Overall balance assessment: Needs assistance Sitting-balance support: Feet supported Sitting balance-Leahy Scale: Good     Standing balance support: No upper extremity supported Standing balance-Leahy Scale: Fair Standing balance comment: no external support required to maintain standing balance                            Cognition Arousal/Alertness: Awake/alert Behavior During Therapy: WFL for tasks assessed/performed, Flat affect Overall Cognitive Status: Within Functional Limits for tasks assessed                                 General Comments: patient is able to follow commands with increased time. interpreter services used  during session via language line ID# [id number]        Exercises General Exercises - Lower Extremity Ankle Circles/Pumps: AROM, Strengthening, Both, 10 reps, Supine Long Arc Quad: AROM, Strengthening, Both, 10 reps,  Seated Heel Slides: AAROM, Strengthening, Both, 10 reps, Supine Hip ABduction/ADduction: AAROM, Strengthening, Both, 10 reps, Supine Straight Leg Raises: AAROM, Strengthening, Both, 10 reps, Supine Other Exercises Other Exercises: verbal cues for exercise technique for strengthening    General Comments        Pertinent Vitals/Pain Pain Assessment Pain Assessment: No/denies pain    Home Living                          Prior Function            PT Goals (current goals can now be found in the care plan section) Acute Rehab PT Goals Patient Stated Goal: to go home PT Goal Formulation: With patient Time For Goal Achievement: 03/21/22 Potential to Achieve Goals: Fair Progress towards PT goals: Progressing toward goals    Frequency    Min 2X/week      PT Plan Current plan remains appropriate    Co-evaluation              AM-PAC PT "6 Clicks" Mobility   Outcome Measure  Help needed turning from your back to your side while in a flat bed without using bedrails?: A Little Help needed moving from lying on your back to sitting on the side of a flat bed without using bedrails?: A Little Help needed moving to and from a bed to a chair (including a wheelchair)?: A Little Help needed standing up from a chair using your arms (e.g., wheelchair or bedside chair)?: A Little Help needed to walk in hospital room?: A Little Help needed climbing 3-5 steps with a railing? : A Lot 6 Click Score: 17    End of Session   Activity Tolerance: Patient tolerated treatment well;Patient limited by fatigue Patient left: in bed;with call bell/phone within reach;with bed alarm set;with family/visitor present   PT Visit Diagnosis: Muscle weakness (generalized) (M62.81);Difficulty in walking, not elsewhere classified (R26.2)     Time: 8325-4982 PT Time Calculation (min) (ACUTE ONLY): 20 min  Charges:  $Therapeutic Exercise: 8-22 mins                     Minna Merritts, PT,  MPT    Percell Locus 03/05/2022, 12:44 PM

## 2022-03-05 NOTE — Progress Notes (Signed)
   03/05/22 0837  Assess: MEWS Score  Temp (!) 100.8 F (38.2 C)  BP (!) 93/59  MAP (mmHg) 70  Pulse Rate 91  Resp 18  Level of Consciousness Alert  SpO2 95 %  O2 Device Room Air  Assess: MEWS Score  MEWS Temp 1  MEWS Systolic 1  MEWS Pulse 0  MEWS RR 0  MEWS LOC 0  MEWS Score 2  MEWS Score Color Yellow  Assess: if the MEWS score is Yellow or Red  Were vital signs taken at a resting state? Yes  Focused Assessment No change from prior assessment  Does the patient meet 2 or more of the SIRS criteria? No  Does the patient have a confirmed or suspected source of infection? Yes  Provider and Rapid Response Notified? Yes  MEWS guidelines implemented *See Row Information* Yes  Treat  MEWS Interventions Escalated (See documentation below)  Take Vital Signs  Increase Vital Sign Frequency  Yellow: Q 2hr X 2 then Q 4hr X 2, if remains yellow, continue Q 4hrs  Escalate  MEWS: Escalate Yellow: discuss with charge nurse/RN and consider discussing with provider and RRT  Notify: Charge Nurse/RN  Name of Charge Nurse/RN Notified Jessi RN  Date Charge Nurse/RN Notified 03/05/22  Time Charge Nurse/RN Notified 0900  Notify: Provider  Provider Name/Title [name] PA  Date Provider Notified 03/05/22  Time Provider Notified 0900  Method of Notification Page  Notification Reason Other (Comment) (Yellow MEWS)  Provider response No new orders  Date of Provider Response 03/05/22  Time of Provider Response 0930  Document  Patient Outcome Other (Comment);Stabilized after interventions  Progress note created (see row info) Yes  Assess: SIRS CRITERIA  SIRS Temperature  0  SIRS Pulse 1  SIRS Respirations  0  SIRS WBC 0  SIRS Score Sum  1

## 2022-03-05 NOTE — Progress Notes (Signed)
[facility] Day(s): 20.   Post op day(s): 18 Days Post-Op.   Interval History:  Patient seen and examined Continues to have intermittent fevers; 102.38F (0600) No nausea, emesis No new labs; monitoring Mon/Thurs NGT discontinued on 06/22; doing well Peripheral Bcx and Bcx from port are without growth Surgical Drains:   - Left drain (Retro Rectus) 10 ccs  - Right Drain (Intraperitoneal) 0 ccs [name] Pouch output 1160 ccs in last 24 hours Cx from LLQ drain with Candida albicans; on Diflucan (stop day 06/27) She is on FLD; on TPN  Pathology reviewed:  -- Metastatic adenocarcinoma; colon origin -- Plan for output follow up with oncology (Dr Tasia Catchings)  Vital signs in last 24 hours: [min-max] current  Temp:  [98.4 F (36.9 C)-102.9 F (39.4 C)] 98.4 F (36.9 C) (06/28 0705) Pulse Rate:  [76-110] 104 (06/28 0608) Resp:  [18-20] 20 (06/28 0608) BP: (104-111)/(58-65) 104/60 (06/28 0608) SpO2:  [92 %-94 %] 94 % (06/28 0608) Weight:  [91.2 kg] 91.2 kg (06/28 0500)     Height: '4\' 11"'$  (149.9 cm) Weight: 91.2 kg BMI (Calculated): 40.59   Intake/Output last 2 shifts:  06/27 0701 - 06/28 0700 In: 560 [P.O.:560] Out: 1160 [Drains:1160]   Physical Exam:  Constitutional: alert, cooperative and no distress  Respiratory: breathing non-labored at rest  Cardiovascular: regular rate and sinus rhythm  Gastrointestinal: Soft, abdominal soreness on the right, non-distended, no rebound/guarding. Surgical drain in LLQ; serosanguinous, RLQ drain output slowing; bilious Integumentary: Again, the superior portion of the anterior fascia has dehisced with exposed mesh now with bilious drainage consistent with EC fistula. Subcutaneous tissues are starting to granulate. [name] Pouch in place with good seal, red rubber present to manage effluent   Labs:     Latest Ref Rng & Units 03/03/2022    6:44 AM 03/01/2022    7:08 AM 02/27/2022    5:49 AM  CBC  WBC 4.0 -  10.5 K/uL 7.7  6.6  4.6   Hemoglobin 12.0 - 15.0 g/dL 8.3  9.5  6.8   Hematocrit 36.0 - 46.0 % 26.0  29.0  21.7   Platelets 150 - 400 K/uL 209  249  246       Latest Ref Rng & Units 03/03/2022    6:44 AM 03/01/2022    7:08 AM 02/27/2022    5:49 AM  CMP  Glucose 70 - 99 mg/dL 180  192  151   BUN 6 - 20 mg/dL '21  18  17   '$ Creatinine 0.44 - 1.00 mg/dL 0.45  0.42  0.38   Sodium 135 - 145 mmol/L 133  133  137   Potassium 3.5 - 5.1 mmol/L 4.4  4.1  4.1   Chloride 98 - 111 mmol/L 105  105  108   CO2 22 - 32 mmol/L '22  24  25   '$ Calcium 8.9 - 10.3 mg/dL 7.8  7.7  7.7   Total Protein 6.5 - 8.1 g/dL 6.4   6.0   Total Bilirubin 0.3 - 1.2 mg/dL 0.3   0.5   Alkaline Phos 38 - 126 U/L 99   76   AST 15 - 41 U/L 32   25   ALT 0 - 44 U/L 25   19      Imaging studies: No new pertinent imaging studies    Assessment/Plan: 56 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   - Wound Care: [name] pouch in place with red rubber present to manage drainage. Anticipate fluid will pool in the wound bed while she is in bed. This will be a mix of serosanguinous fluid and bilious fluid. It is okay to adjust the red rubber catheter from time to time to help with drainage while in bed. This drains well while she is ambulatory and sitting up given dependent location of the drain. These pouches need changed every 7-10 days (ideally).    - Monitor fever curve; continues to have intermittent fevers; will repeat CT Abdomen/Pelvis today to ensure no missed intra-abdominal process or that this fistula is also collecting somewhere intra-abdominally.   - Continue FLD as tolerated + nutritional supplementation  - Continue TPN at goal; monitor electrolytes - Continue octreotide;  200 mcg/min TID, continue imodium   - ID on board; restarted on Zosyn 06/27  - Continue surgical drains; monitor and record output  - Monitor abdominal examination; on-going bowel function  - Pain control prn; antemetic prn  - Out of bed; therapies on board; recommendations are HHPT  All of the above findings and recommendations were discussed with the patient, and the medical team, and all of patient's questions were answered to her expressed satisfaction.  -- [name], PA-C [facility] 03/05/2022, 7:18 AM M-F: 7am - 4pm

## 2022-03-05 NOTE — Progress Notes (Signed)
Occupational Therapy Treatment Patient Details Name: Debra [name] MRN: [id number] DOB: 09/20/1965 Today's Date: 03/05/2022   History of present illness Pt is a 56 y.o. female with PMH including hypothyroidism, iron deficient anemia, colon cancer.  Here s/p mass removal with hernia repair and appendectomy 6/8, bowel perforation repair 6/10.   OT comments  Upon entering the room, pt supine in bed and agreeable to OT intervention. Pt's daughter present to interpret during session. Pt does report feeling nauseated and RN called for medication. Session with focus on B UE strengthening with use of yellow resistive theraband. Pt performing 3 sets of 10 chest pulls, shoulder elevation, and shoulder diagonals with rest breaks as needed. Theraband was left with pt to engage in exercise on her own and she and daughter verbalized understanding and agreement. Pt remains in bed and resting with all needs within reach.    Recommendations for follow up therapy are one component of a multi-disciplinary discharge planning process, led by the attending physician.  Recommendations may be updated based on patient status, additional functional criteria and insurance authorization.    Follow Up Recommendations  Skilled nursing-short term rehab (<3 hours/day)    Assistance Recommended at Discharge Frequent or constant Supervision/Assistance  Patient can return home with the following  A lot of help with walking and/or transfers;A lot of help with bathing/dressing/bathroom;Assistance with cooking/housework;Assist for transportation   Equipment Recommendations  BSC/3in1       Precautions / Restrictions Precautions Precautions: Fall Precaution Comments: multiple drains Restrictions Weight Bearing Restrictions: No              ADL either performed or assessed with clinical judgement    Extremity/Trunk Assessment Upper Extremity Assessment Upper Extremity Assessment: Generalized weakness;Overall  [facility] for tasks assessed   Lower Extremity Assessment Lower Extremity Assessment: Overall WFL for tasks assessed;Generalized weakness        Vision Patient Visual Report: No change from baseline            Cognition Arousal/Alertness: Awake/alert Behavior During Therapy: WFL for tasks assessed/performed, Flat affect Overall Cognitive Status: Within Functional Limits for tasks assessed                                                     Pertinent Vitals/ Pain       Pain Assessment Pain Assessment: No/denies pain         Frequency  Min 2X/week        Progress Toward Goals  OT Goals(current goals can now be found in the care plan section)  Progress towards OT goals: Progressing toward goals  Acute Rehab OT Goals Patient Stated Goal: to get stronger and go home OT Goal Formulation: With patient/family Time For Goal Achievement: 03/13/22 Potential to Achieve Goals: Good  Plan Discharge plan remains appropriate;Frequency remains appropriate       AM-PAC OT "6 Clicks" Daily Activity     Outcome Measure   Help from another person eating meals?: A Little Help from another person taking care of personal grooming?: A Little Help from another person toileting, which includes using toliet, bedpan, or urinal?: A Little Help from another person bathing (including washing, rinsing, drying)?: A Lot Help from another person to put on and taking off regular upper body clothing?: A Little Help from another person to put on and taking off  regular lower body clothing?: A Lot 6 Click Score: 16    End of Session    OT Visit Diagnosis: Unsteadiness on feet (R26.81)   Activity Tolerance Patient limited by fatigue   Patient Left in bed;with call bell/phone within reach;with bed alarm set;with family/visitor present   Nurse Communication Other (comment) (medication for nausea)        Time: 7614-7092 OT Time Calculation (min): 23 min  Charges: OT  General Charges $OT Visit: 1 Visit OT Treatments $Therapeutic Exercise: 23-37 mins  Darleen Crocker, MS, OTR/L , CBIS ascom [phone]  03/05/22, 4:08 PM

## 2022-03-05 NOTE — Progress Notes (Signed)
PHARMACY - TOTAL PARENTERAL NUTRITION CONSULT NOTE   Indication: Prolonged ileus  Patient Measurements: Height: '4\' 11"'$  (149.9 cm) Weight: 91.2 kg (201 lb 1 oz) IBW/kg (Calculated) : 43.2 TPN AdjBW (KG): 55.2 Body mass index is 40.61 kg/m.  Assessment:  56 y.o. female  s/p laparotomy, excision of greater omental mass, abdominal wall reconstruction with [name],appendectomy, and placement of Prevena vac.  Glucose / Insulin: no insulin requirements prior to TPN --insulin requirements previous 24h: Novolog 13 units and 44 units of regular insulin in TPN bag (BG 175-194) Electrolytes: WNL Renal: Scr<1 Hepatic: LFTs wnl Intake / Output; MIVF:(+11.9L this admit/ NS stopped 6/22) GI Imaging: GI Surgeries / Procedures: s/p laparotomy, excision of greater omental mass, abdominal wall reconstruction with [name],appendectomy, and placement of Prevena vac  Central access: 02/15/22 TPN start date: 02/15/22  Nutritional Goals: Goal TPN rate is 75 mL/hr (provides 110 g of protein and 2000 kcals per day)  RD Assessment:  Estimated Needs Total Energy Estimated Needs: 1800-2100kcal/day Total Protein Estimated Needs: 90-110g/d Total Fluid Estimated Needs: 1.4-1.6L/day  Current Nutrition: Full liquid  Plan:  Continue TPN at goal rate of 62m/hr Nutritional Components Amino acids (using 15% Clinisol): 110 grams Dextrose: 282.6 grams Lipids (using 20% SMOFlipids): 60 grams kCal: 2000/24h  Electrolytes in TPN (standard): Na 556m/L, K 5050mL, Ca 5mE76m, Mg 5mEq19m and Phos 25 mmol/L. Cl:Ac 1:1 Add trace elements to TPN, MVI changed to PO  Add insulin to 44 units in TPN SSI three times daily with meals and adjust as needed Monitor TPN labs on Mon/Thurs  MorgaDarnelle BosrmD 03/05/2022 11:08 AM

## 2022-03-05 NOTE — Progress Notes (Signed)
ID  zosyn 6/10-6/16 Fluconazole 02/19/22-03/03/22 Meropenem 6/16-6/23/23  Pt c/o chills fever  O/e looks ill Patient Vitals for the past 24 hrs:  BP Temp Temp src Pulse Resp SpO2 Weight  03/05/22 0837 (!) 93/59 (!) 100.8 F (38.2 C) Oral 91 18 95 % --  03/05/22 0705 -- 98.4 F (36.9 C) Oral -- -- -- --  03/05/22 0608 104/60 (!) 102.9 F (39.4 C) Oral (!) 104 20 94 % --  03/05/22 0500 -- -- -- -- -- -- 91.2 kg  03/04/22 2138 (!) 109/58 99.5 F (37.5 C) Oral (!) 110 20 92 % --  03/04/22 1623 111/65 99.4 F (37.4 C) Oral 76 18 92 % --   Chest b/l air entry Decreased bases Hss1s2-tachycardia PORT  Abd - wound manager- enterocutaneous fistula  Labs    Latest Ref Rng & Units 03/03/2022    6:44 AM 03/01/2022    7:08 AM 02/27/2022    5:49 AM  CBC  WBC 4.0 - 10.5 K/uL 7.7  6.6  4.6   Hemoglobin 12.0 - 15.0 g/dL 8.3  9.5  6.8   Hematocrit 36.0 - 46.0 % 26.0  29.0  21.7   Platelets 150 - 400 K/uL 209  249  246        Latest Ref Rng & Units 03/03/2022    6:44 AM 03/01/2022    7:08 AM 02/27/2022    5:49 AM  CMP  Glucose 70 - 99 mg/dL 180  192  151   BUN 6 - 20 mg/dL '21  18  17   '$ Creatinine 0.44 - 1.00 mg/dL 0.45  0.42  0.38   Sodium 135 - 145 mmol/L 133  133  137   Potassium 3.5 - 5.1 mmol/L 4.4  4.1  4.1   Chloride 98 - 111 mmol/L 105  105  108   CO2 22 - 32 mmol/L '22  24  25   '$ Calcium 8.9 - 10.3 mg/dL 7.8  7.7  7.7   Total Protein 6.5 - 8.1 g/dL 6.4   6.0   Total Bilirubin 0.3 - 1.2 mg/dL 0.3   0.5   Alkaline Phos 38 - 126 U/L 99   76   AST 15 - 41 U/L 32   25   ALT 0 - 44 U/L 25   19      Micro Blood culture 6/13- NG 6/15- JP drain fluid candida  Impression/recommendation  Intraabdominal abscess secondary to bowel perforation Fecal spillage with peritonitis Enterocutaneous fistula Pt completed Iv antibiotic meropenem on 02/28/22- fever intermittently since then Restarted zosyn on 03/04/22 Sent blood culture today CT abdomen/pelvis done today  H/o colon ca s/p  left hemicolectomy May 2021  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

## 2022-03-05 NOTE — Plan of Care (Signed)
Patient's wound to abdomen continues to receive wound vac treatment to aid the healing process. Patient denies any pain, but hs had low grade fever and increase in fever level this morning, given PRN tylenol. Patient up to [facility] throughout the night.  Problem: Education: Goal: Knowledge of General Education information will improve Description: Including pain rating scale, medication(s)/side effects and non-pharmacologic comfort measures Outcome: Progressing   Problem: Health Behavior/Discharge Planning: Goal: Ability to manage health-related needs will improve Outcome: Progressing   Problem: Clinical Measurements: Goal: Ability to maintain clinical measurements within normal limits will improve Outcome: Progressing Goal: Will remain free from infection Outcome: Progressing Goal: Diagnostic test results will improve Outcome: Progressing Goal: Respiratory complications will improve Outcome: Progressing Goal: Cardiovascular complication will be avoided Outcome: Progressing   Problem: Activity: Goal: Risk for activity intolerance will decrease Outcome: Progressing   Problem: Nutrition: Goal: Adequate nutrition will be maintained Outcome: Progressing   Problem: Coping: Goal: Level of anxiety will decrease Outcome: Progressing   Problem: Elimination: Goal: Will not experience complications related to bowel motility Outcome: Progressing Goal: Will not experience complications related to urinary retention Outcome: Progressing   Problem: Pain Managment: Goal: General experience of comfort will improve Outcome: Progressing   Problem: Safety: Goal: Ability to remain free from injury will improve Outcome: Progressing   Problem: Skin Integrity: Goal: Risk for impaired skin integrity will decrease Outcome: Progressing   Problem: Education: Goal: Ability to describe self-care measures that may prevent or decrease complications (Diabetes Survival Skills Education) will  improve Outcome: Progressing Goal: Individualized Educational Video(s) Outcome: Progressing   Problem: Coping: Goal: Ability to adjust to condition or change in health will improve Outcome: Progressing   Problem: Fluid Volume: Goal: Ability to maintain a balanced intake and output will improve Outcome: Progressing   Problem: Health Behavior/Discharge Planning: Goal: Ability to identify and utilize available resources and services will improve Outcome: Progressing Goal: Ability to manage health-related needs will improve Outcome: Progressing   Problem: Metabolic: Goal: Ability to maintain appropriate glucose levels will improve Outcome: Progressing   Problem: Nutritional: Goal: Maintenance of adequate nutrition will improve Outcome: Progressing Goal: Progress toward achieving an optimal weight will improve Outcome: Progressing   Problem: Skin Integrity: Goal: Risk for impaired skin integrity will decrease Outcome: Progressing   Problem: Tissue Perfusion: Goal: Adequacy of tissue perfusion will improve Outcome: Progressing

## 2022-03-06 LAB — GLUCOSE, CAPILLARY
Glucose-Capillary: 164 mg/dL — ABNORMAL HIGH (ref 70–99)
Glucose-Capillary: 177 mg/dL — ABNORMAL HIGH (ref 70–99)
Glucose-Capillary: 203 mg/dL — ABNORMAL HIGH (ref 70–99)
Glucose-Capillary: 204 mg/dL — ABNORMAL HIGH (ref 70–99)
Glucose-Capillary: 208 mg/dL — ABNORMAL HIGH (ref 70–99)
Glucose-Capillary: 227 mg/dL — ABNORMAL HIGH (ref 70–99)
Glucose-Capillary: 238 mg/dL — ABNORMAL HIGH (ref 70–99)

## 2022-03-06 LAB — COMPREHENSIVE METABOLIC PANEL
ALT: 23 U/L (ref 0–44)
AST: 25 U/L (ref 15–41)
Albumin: 2.1 g/dL — ABNORMAL LOW (ref 3.5–5.0)
Alkaline Phosphatase: 101 U/L (ref 38–126)
Anion gap: 5 (ref 5–15)
BUN: 30 mg/dL — ABNORMAL HIGH (ref 6–20)
CO2: 25 mmol/L (ref 22–32)
Calcium: 8 mg/dL — ABNORMAL LOW (ref 8.9–10.3)
Chloride: 105 mmol/L (ref 98–111)
Creatinine, Ser: 0.61 mg/dL (ref 0.44–1.00)
GFR, Estimated: >60 mL/min (ref >60)
Glucose, Bld: 182 mg/dL — ABNORMAL HIGH (ref 70–99)
Potassium: 4.7 mmol/L (ref 3.5–5.1)
Sodium: 135 mmol/L (ref 135–145)
Total Bilirubin: 0.4 mg/dL (ref 0.3–1.2)
Total Protein: 6.5 g/dL (ref 6.5–8.1)

## 2022-03-06 LAB — PHOSPHORUS: Phosphorus: 3.7 mg/dL (ref 2.5–4.6)

## 2022-03-06 LAB — TRIGLYCERIDES: Triglycerides: 73 mg/dL (ref <150)

## 2022-03-06 LAB — MAGNESIUM: Magnesium: 2.4 mg/dL (ref 1.7–2.4)

## 2022-03-06 MED ORDER — TRACE MINERALS CU-MN-SE-ZN 300-55-60-3000 MCG/ML IV SOLN
INTRAVENOUS | Status: AC
  Filled 2022-03-06: qty 733.2

## 2022-03-06 NOTE — Consult Note (Signed)
[facility] Nurse Consult Note: Evangeline Dakin, requested WOC assistance with changing the large abdominal [name] pouch to suction, tomorrow 6/30. I have placed the patient on the [facility] f/u list for this care need on Friday 6/30.  There is a large [name] pouch at the [name] desk for use.  Val Riles, RN, MSN, CWOCN, CNS-BC, pager [phone]

## 2022-03-06 NOTE — Progress Notes (Signed)
Physical Therapy Treatment Patient Details Name: Debra [name] MRN: [id number] DOB: 09-Aug-1966 Today's Date: 03/06/2022   History of Present Illness Pt is a 56 y.o. female with PMH including hypothyroidism, iron deficient anemia, colon cancer.  Here s/p mass removal with hernia repair and appendectomy 6/8, bowel perforation repair 6/10.    PT Comments    Pt received sitting EOB. Just transferred back to EOB with family after using BSC.  Pt performing seated LE therex and x3 STS's before requesting to return to supine due to fatigue. Pt assisted with minA at LE's and maxA+2 to return to [facility]. All needs in reach with family at bedside.    Recommendations for follow up therapy are one component of a multi-disciplinary discharge planning process, led by the attending physician.  Recommendations may be updated based on patient status, additional functional criteria and insurance authorization.  Follow Up Recommendations  Skilled nursing-short term rehab (<3 hours/day) Can patient physically be transported by private vehicle: Yes   Assistance Recommended at Discharge Frequent or constant Supervision/Assistance  Patient can return home with the following Help with stairs or ramp for entrance;Assist for transportation;Assistance with cooking/housework;A lot of help with bathing/dressing/bathroom;A little help with walking and/or transfers   Equipment Recommendations  Rolling walker (2 wheels);Hospital bed;Wheelchair cushion (measurements PT);Wheelchair (measurements PT);BSC/3in1    Recommendations for Other Services       Precautions / Restrictions Precautions Precautions: Fall Precaution Comments: multiple drains Restrictions Weight Bearing Restrictions: No Other Position/Activity Restrictions: Abdominal surgery     Mobility  Bed Mobility   Bed Mobility: Sit to Supine       Sit to supine: Min assist   General bed mobility comments: Very minimal assist provided on LE's for  returning to supine in bed. MAxA+2 to scoot up towards [place]. Patient Response: Flat affect, Cooperative  Transfers Overall transfer level: Needs assistance Equipment used: None Transfers: Sit to/from Stand Sit to Stand: Min guard                Ambulation/Gait                   Stairs             Wheelchair Mobility    Modified Rankin (Stroke Patients Only)       Balance Overall balance assessment: Needs assistance Sitting-balance support: Feet supported Sitting balance-Leahy Scale: Good     Standing balance support: No upper extremity supported Standing balance-Leahy Scale: Fair                              Cognition Arousal/Alertness: Awake/alert Behavior During Therapy: WFL for tasks assessed/performed, Flat affect Overall Cognitive Status: Within Functional Limits for tasks assessed                                 General Comments: Pt pleasant and agreeable to PT. Follows all commands. Pt's daughter used for interpretation        Exercises General Exercises - Lower Extremity Long Arc Quad: AROM, Strengthening, Both, 10 reps, Seated Hip Flexion/Marching: AROM, Strengthening, Both, 10 reps, Seated Toe Raises: AROM, Strengthening, Seated, Both, 10 reps Heel Raises: AROM, Strengthening, Seated, Both, 10 reps    General Comments        Pertinent Vitals/Pain Pain Assessment Pain Assessment: No/denies pain Pain Location: did not exhibit pain signs/symptoms with mobility  Home Living                          Prior Function            PT Goals (current goals can now be found in the care plan section) Acute Rehab PT Goals Patient Stated Goal: to go home PT Goal Formulation: With patient Time For Goal Achievement: 03/21/22 Potential to Achieve Goals: Fair Progress towards PT goals: Progressing toward goals    Frequency    Min 2X/week      PT Plan Current plan remains appropriate     Co-evaluation              AM-PAC PT "6 Clicks" Mobility   Outcome Measure  Help needed turning from your back to your side while in a flat bed without using bedrails?: A Little Help needed moving from lying on your back to sitting on the side of a flat bed without using bedrails?: A Little Help needed moving to and from a bed to a chair (including a wheelchair)?: A Little Help needed standing up from a chair using your arms (e.g., wheelchair or bedside chair)?: A Little Help needed to walk in hospital room?: A Lot Help needed climbing 3-5 steps with a railing? : A Lot 6 Click Score: 16    End of Session Equipment Utilized During Treatment: Gait belt Activity Tolerance: Patient tolerated treatment well;Patient limited by fatigue Patient left: in bed;with call bell/phone within reach;with bed alarm set;with family/visitor present Nurse Communication: Mobility status PT Visit Diagnosis: Muscle weakness (generalized) (M62.81);Difficulty in walking, not elsewhere classified (R26.2)     Time: 5916-3846 PT Time Calculation (min) (ACUTE ONLY): 14 min  Charges:  $Therapeutic Exercise: 8-22 mins                     Salem Caster. Fairly IV, PT, DPT Physical Therapist- [facility]  03/06/2022, 11:35 AM

## 2022-03-06 NOTE — Progress Notes (Signed)
ID  zosyn 6/10-6/16 Fluconazole 02/19/22-03/03/22 Meropenem 6/16-6/23/23   Feeling a little better today Says no chills  Says she is drinking fluids   O/eawake, alert, no tachypnea Patient Vitals for the past 24 hrs:  BP Temp Temp src Pulse Resp SpO2 Weight  03/06/22 0746 (!) 102/59 98.9 F (37.2 C) Oral (!) 108 20 92 % --  03/06/22 0638 -- -- -- -- -- -- 91 kg  03/06/22 0456 (!) 108/56 99.8 F (37.7 C) -- (!) 102 16 96 % --  03/05/22 2231 99/63 98.4 F (36.9 C) -- 83 16 96 % --  03/05/22 2143 -- -- -- 98 -- 96 % --  03/05/22 2043 (!) 102/59 98.3 F (36.8 C) -- (!) 104 16 (!) 89 % --  03/05/22 1756 (!) 104/58 100.3 F (37.9 C) Oral (!) 106 18 97 % --   Chest b/l air entry Decreased bases Hss1s2-tachycardia PORT  Abd - wound manager- enterocutaneous fistula  Labs    Latest Ref Rng & Units 03/03/2022    6:44 AM 03/01/2022    7:08 AM 02/27/2022    5:49 AM  CBC  WBC 4.0 - 10.5 K/uL 7.7  6.6  4.6   Hemoglobin 12.0 - 15.0 g/dL 8.3  9.5  6.8   Hematocrit 36.0 - 46.0 % 26.0  29.0  21.7   Platelets 150 - 400 K/uL 209  249  246        Latest Ref Rng & Units 03/06/2022    5:51 AM 03/03/2022    6:44 AM 03/01/2022    7:08 AM  CMP  Glucose 70 - 99 mg/dL 182  180  192   BUN 6 - 20 mg/dL '30  21  18   '$ Creatinine 0.44 - 1.00 mg/dL 0.61  0.45  0.42   Sodium 135 - 145 mmol/L 135  133  133   Potassium 3.5 - 5.1 mmol/L 4.7  4.4  4.1   Chloride 98 - 111 mmol/L 105  105  105   CO2 22 - 32 mmol/L '25  22  24   '$ Calcium 8.9 - 10.3 mg/dL 8.0  7.8  7.7   Total Protein 6.5 - 8.1 g/dL 6.5  6.4    Total Bilirubin 0.3 - 1.2 mg/dL 0.4  0.3    Alkaline Phos 38 - 126 U/L 101  99    AST 15 - 41 U/L 25  32    ALT 0 - 44 U/L 23  25       Micro Blood culture 6/13- NG 6/15- JP drain fluid candida  Impression/recommendation  Intraabdominal abscess secondary to bowel perforation Fecal spillage with peritonitis Enterocutaneous fistula Pt completed Iv antibiotic meropenem on 02/28/22- fever  intermittently since then Restarted zosyn on 03/04/22 Sent blood culture   CT abdomen/pelvis shows no intraabdominal abscess  H/o colon ca s/p left hemicolectomy May 2021  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

## 2022-03-06 NOTE — Progress Notes (Signed)
[place] [facility] Day(s): 21.   Post op day(s): 19 Days Post-Op.   Interval History:  Patient seen and examined Last fever: 06/28 at 0800 (100.28F) --> Restarted on Zosyn 06/27 by ID No nausea, emesis Renal function normal; sCr - 0.61; UO - unmeasured No significant electrolyte derangements  NGT discontinued on 06/22; doing well Surgical Drains:   - Left drain (Retro Rectus) 0 ccs  - Right Drain (Intraperitoneal) 0 ccs [name] Pouch output 700 ccs in last 24 hours She is on FLD; on TPN Working with therapies; recommending SNF  Pathology reviewed:  -- Metastatic adenocarcinoma; colon origin -- Plan for output follow up with oncology (Dr Tasia Catchings)  Vital signs in last 24 hours: [min-max] current  Temp:  [98.3 F (36.8 C)-100.8 F (38.2 C)] 99.8 F (37.7 C) (06/29 0456) Pulse Rate:  [83-106] 102 (06/29 0456) Resp:  [16-18] 16 (06/29 0456) BP: (93-108)/(56-63) 108/56 (06/29 0456) SpO2:  [89 %-98 %] 96 % (06/29 0456) Weight:  [91 kg] 91 kg (06/29 0638)     Height: '4\' 11"'$  (149.9 cm) Weight: 91 kg BMI (Calculated): 40.5   Intake/Output last 2 shifts:  06/28 0701 - 06/29 0700 In: 120 [P.O.:120] Out: 700 [Drains:700]   Physical Exam:  Constitutional: alert, cooperative and no distress  Respiratory: breathing non-labored at rest  Cardiovascular: regular rate and sinus rhythm  Gastrointestinal: Soft, abdominal soreness on the right, non-distended, no rebound/guarding. Surgical drain in LLQ; serosanguinous, RLQ drain output slowing; bilious Integumentary: Again, the superior portion of the anterior fascia has dehisced with exposed mesh now with bilious drainage consistent with EC fistula. Subcutaneous tissues are starting to granulate. [name] Pouch in place with good seal, red rubber present to manage effluent   Labs:     Latest Ref Rng & Units 03/03/2022    6:44 AM 03/01/2022    7:08 AM 02/27/2022    5:49 AM  CBC  WBC 4.0 - 10.5 K/uL 7.7  6.6   4.6   Hemoglobin 12.0 - 15.0 g/dL 8.3  9.5  6.8   Hematocrit 36.0 - 46.0 % 26.0  29.0  21.7   Platelets 150 - 400 K/uL 209  249  246       Latest Ref Rng & Units 03/06/2022    5:51 AM 03/03/2022    6:44 AM 03/01/2022    7:08 AM  CMP  Glucose 70 - 99 mg/dL 182  180  192   BUN 6 - 20 mg/dL '30  21  18   '$ Creatinine 0.44 - 1.00 mg/dL 0.61  0.45  0.42   Sodium 135 - 145 mmol/L 135  133  133   Potassium 3.5 - 5.1 mmol/L 4.7  4.4  4.1   Chloride 98 - 111 mmol/L 105  105  105   CO2 22 - 32 mmol/L '25  22  24   '$ Calcium 8.9 - 10.3 mg/dL 8.0  7.8  7.7   Total Protein 6.5 - 8.1 g/dL 6.5  6.4    Total Bilirubin 0.3 - 1.2 mg/dL 0.4  0.3    Alkaline Phos 38 - 126 U/L 101  99    AST 15 - 41 U/L 25  32    ALT 0 - 44 U/L 23  25       Imaging studies: No new pertinent imaging studies    Assessment/Plan: 56 y.o. female with high output enterocutaneous fistula 19 Days Post-Op s/p re-opening of laparotomy for repair of small bowel perforation  and 14 days s/p laparotomy, excision of greater omental mass, abdominal wall reconstruction with [name],appendectomy, and placement of Prevena vac.   - Wound Care: [name] pouch in place with red rubber present to manage drainage. Anticipate fluid will pool in the wound bed while she is in bed. This will be a mix of serosanguinous fluid and bilious fluid. It is okay to adjust the red rubber catheter from time to time to help with drainage while in bed. This drains well while she is ambulatory and sitting up given dependent location of the drain. These pouches need changed every 7-10 days (ideally). Will change tomorrow (06/30) with assistance of WOC; greatly appreciated as always  - Continue FLD as tolerated + nutritional supplementation  - Continue TPN at goal; monitor electrolytes - Continue octreotide; 200 mcg/min TID, continue imodium   - ID on board; restarted on Zosyn 06/27  - Continue surgical drains; monitor and record output  - Monitor  abdominal examination; on-going bowel function  - Pain control prn; antemetic prn  - Out of bed; therapies on board; recommendations are HHPT  All of the above findings and recommendations were discussed with the patient, and the medical team, and all of patient's questions were answered to her expressed satisfaction.  -- [name], PA-C [facility] 03/06/2022, 7:18 AM M-F: 7am - 4pm

## 2022-03-06 NOTE — Progress Notes (Signed)
Suction catheter to abdominal wound suddenly suctioning intermittently, catheter has to be maneuvered  and then suction will start again and then stop. Zach/PA with surgery notified.

## 2022-03-06 NOTE — Progress Notes (Signed)
PHARMACY - TOTAL PARENTERAL NUTRITION CONSULT NOTE   Indication: Prolonged ileus  Patient Measurements: Height: '4\' 11"'$  (149.9 cm) Weight: 91 kg (200 lb 9.9 oz) IBW/kg (Calculated) : 43.2 TPN AdjBW (KG): 55.2 Body mass index is 40.52 kg/m.  Assessment:  56 y.o. female  s/p laparotomy, excision of greater omental mass, abdominal wall reconstruction with [name],appendectomy, and placement of Prevena vac.  Glucose / Insulin: no insulin requirements prior to TPN --insulin requirements previous 24h: Novolog 9 units and 44 units of regular insulin in TPN bag (BG 173-208) Electrolytes: WNL Renal: Scr<1 Hepatic: LFTs wnl Intake / Output; MIVF:(+12.6L this admit/ NS stopped 6/22) GI Imaging: GI Surgeries / Procedures: s/p laparotomy, excision of greater omental mass, abdominal wall reconstruction with [name],appendectomy, and placement of Prevena vac  Central access: 02/15/22 TPN start date: 02/15/22  Nutritional Goals: Goal TPN rate is 75 mL/hr (provides 110 g of protein and 2000 kcals per day)  RD Assessment:  Estimated Needs Total Energy Estimated Needs: 1800-2100kcal/day Total Protein Estimated Needs: 90-110g/d Total Fluid Estimated Needs: 1.4-1.6L/day  Current Nutrition: Full liquid  Plan:  Continue TPN at goal rate of 45m/hr Nutritional Components Amino acids (using 15% Clinisol): 110 grams Dextrose: 282.6 grams Lipids (using 20% SMOFlipids): 60 grams kCal: 2000/24h  Electrolytes in TPN (standard): Na 521m/L, K 5049mL, Ca 5mE30m, Mg 5mEq66m and Phos 25 mmol/L. Cl:Ac 1:1 Add trace elements to TPN, MVI changed to PO  Continue insulin 44 units in TPN SSI three times daily with meals and adjust as needed Monitor TPN labs on Mon/Thurs  WalidPearla DubonnetrmD 03/06/2022 11:32 AM

## 2022-03-06 NOTE — Plan of Care (Signed)

## 2022-03-06 NOTE — TOC Progression Note (Signed)
Transition of Care [facility] [place]) - Progression Note    Patient Details  Name: Debra [name] MRN: [id number] Date of Birth: 1966-03-28  Transition of Care [facility]) CM/SW [place], RN Phone Number: 03/06/2022, 11:16 AM  Clinical Narrative:    TOC to continue to follow and provide assistance for DC planning, Alvis Lemmings accepted the patient thru the [facility] program for [facility] services, She is not able to go to STR SNF due to no payer source and no Ins She will be set up with DME including a hospital bed, RW and 3 in 1 when she discharged also thru the charity program with adapt.  [name] to be changed planning for tomorrow by surgeon and [place]   Expected Discharge Plan: [place] Barriers to Discharge: Continued Medical Work up  Expected Discharge Plan and Services Expected Discharge Plan: [place]                                               Social Determinants of Health (SDOH) Interventions    Readmission Risk Interventions     No data to display

## 2022-03-07 DIAGNOSIS — T8131XA Disruption of external operation (surgical) wound, not elsewhere classified, initial encounter: Secondary | ICD-10-CM

## 2022-03-07 LAB — CBC
HCT: 24.8 % — ABNORMAL LOW (ref 36.0–46.0)
Hemoglobin: 8.1 g/dL — ABNORMAL LOW (ref 12.0–15.0)
MCH: 28.5 pg (ref 26.0–34.0)
MCHC: 32.7 g/dL (ref 30.0–36.0)
MCV: 87.3 fL (ref 80.0–100.0)
Platelets: 189 10*3/uL (ref 150–400)
RBC: 2.84 MIL/uL — ABNORMAL LOW (ref 3.87–5.11)
RDW: 14.8 % (ref 11.5–15.5)
WBC: 7.6 10*3/uL (ref 4.0–10.5)
nRBC: 0 % (ref 0.0–0.2)

## 2022-03-07 LAB — GLUCOSE, CAPILLARY
Glucose-Capillary: 271 mg/dL — ABNORMAL HIGH (ref 70–99)
Glucose-Capillary: 250 mg/dL — ABNORMAL HIGH (ref 70–99)
Glucose-Capillary: 243 mg/dL — ABNORMAL HIGH (ref 70–99)
Glucose-Capillary: 220 mg/dL — ABNORMAL HIGH (ref 70–99)

## 2022-03-07 MED ORDER — DEXTROSE 10 % IV SOLN: INTRAVENOUS | Status: AC

## 2022-03-07 MED ORDER — TRACE MINERALS CU-MN-SE-ZN 300-55-60-3000 MCG/ML IV SOLN
INTRAVENOUS | Status: AC
  Filled 2022-03-07: qty 707.2

## 2022-03-07 NOTE — Progress Notes (Addendum)
ID  zosyn 6/10-6/16 Fluconazole 02/19/22-03/03/22 Meropenem 6/16-6/23/23 Zosyn 03/04/22  Feeling  better today No fever or chills drinking fluids   O/eawake, alert, no tachypnea Patient Vitals for the past 24 hrs:  BP Temp Temp src Pulse Resp SpO2  03/07/22 0846 116/64 99.1 F (37.3 C) -- 96 18 95 %  03/07/22 0315 111/61 98.6 F (37 C) Oral (!) 101 20 93 %  03/06/22 1940 116/63 98 F (36.7 C) Oral 91 20 95 %  03/06/22 1700 99/62 98.3 F (36.8 C) Oral 87 20 97 %   Chest b/l air entry Decreased bases Hss1s2-tachycardia PORT  Abd - large abdominal wall open wound with mesh exposed   High volume brownish fluid in the canister Labs    Latest Ref Rng & Units 03/07/2022    5:44 AM 03/03/2022    6:44 AM 03/01/2022    7:08 AM  CBC  WBC 4.0 - 10.5 K/uL 7.6  7.7  6.6   Hemoglobin 12.0 - 15.0 g/dL 8.1  8.3  9.5   Hematocrit 36.0 - 46.0 % 24.8  26.0  29.0   Platelets 150 - 400 K/uL 189  209  249        Latest Ref Rng & Units 03/06/2022    5:51 AM 03/03/2022    6:44 AM 03/01/2022    7:08 AM  CMP  Glucose 70 - 99 mg/dL 182  180  192   BUN 6 - 20 mg/dL '30  21  18   '$ Creatinine 0.44 - 1.00 mg/dL 0.61  0.45  0.42   Sodium 135 - 145 mmol/L 135  133  133   Potassium 3.5 - 5.1 mmol/L 4.7  4.4  4.1   Chloride 98 - 111 mmol/L 105  105  105   CO2 22 - 32 mmol/L '25  22  24   '$ Calcium 8.9 - 10.3 mg/dL 8.0  7.8  7.7   Total Protein 6.5 - 8.1 g/dL 6.5  6.4    Total Bilirubin 0.3 - 1.2 mg/dL 0.4  0.3    Alkaline Phos 38 - 126 U/L 101  99    AST 15 - 41 U/L 25  32    ALT 0 - 44 U/L 23  25       Micro Blood culture 6/13- NG 6/15- JP drain fluid candida  Impression/recommendation Abdominal wall wound dehiscence Intraabdominal abscess secondary to bowel perforation Fecal spillage with peritonitis Enterocutaneous fistula Pt completed Iv antibiotic meropenem on 02/28/22- fever intermittently since then Restarted zosyn on 03/04/22- no fever now  blood culture neg so far Has PORT- but  currently does not look like the port is the source of fever  CT abdomen/pelvis shows no intraabdominal abscess. 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

## 2022-03-07 NOTE — Progress Notes (Signed)
Occupational Therapy Treatment Patient Details Name: Debra [name] MRN: [id number] DOB: 09/23/1965 Today's Date: 03/07/2022   History of present illness Pt is a 55 y.o. female with PMH including hypothyroidism, iron deficient anemia, colon cancer.  Here s/p mass removal with hernia repair and appendectomy 6/8, bowel perforation repair 6/10.   OT comments  Chart reviewed, pt greeted after PT session agreeable to further therapy and mobility. Grooming tasks completed in sitting with SET UP, amb approx 5 feet to bathroom and back with CGA. Pt is requesting to return to bed due to fatigue. Boost completed with supervision with use of bed features. Pt is left as received, NAD, all needs met. OT will follow acutely.    Recommendations for follow up therapy are one component of a multi-disciplinary discharge planning process, led by the attending physician.  Recommendations may be updated based on patient status, additional functional criteria and insurance authorization.    Follow Up Recommendations  Skilled nursing-short term rehab (<3 hours/day)    Assistance Recommended at Discharge Frequent or constant Supervision/Assistance  Patient can return home with the following  A lot of help with walking and/or transfers;A lot of help with bathing/dressing/bathroom;Assistance with cooking/housework;Assist for transportation   Equipment Recommendations  BSC/3in1    Recommendations for Other Services      Precautions / Restrictions Precautions Precautions: Fall Precaution Comments: multiple drains Restrictions Weight Bearing Restrictions: No Other Position/Activity Restrictions: Abdominal surgery       Mobility Bed Mobility Overal bed mobility: Needs Assistance Bed Mobility: Sit to Supine       Sit to supine: Min assist   General bed mobility comments: for management of LLE    Transfers Overall transfer level: Needs assistance Equipment used: None Transfers: Sit to/from  Stand Sit to Stand: Min guard     Step pivot transfers: Min guard           Balance Overall balance assessment: Needs assistance Sitting-balance support: Feet supported Sitting balance-Leahy Scale: Good     Standing balance support: No upper extremity supported Standing balance-Leahy Scale: Fair                             ADL either performed or assessed with clinical judgement   ADL Overall ADL's : Needs assistance/impaired     Grooming: Oral care;Sitting;Set up                                      Extremity/Trunk Assessment              Vision       Perception     Praxis      Cognition Arousal/Alertness: Awake/alert Behavior During Therapy: WFL for tasks assessed/performed, Flat affect Overall Cognitive Status: Within Functional Limits for tasks assessed                                          Exercises Other Exercises Other Exercises: edu re: HEP, body mechanics, importance of progressing mobility    Shoulder Instructions       General Comments      Pertinent Vitals/ Pain       Pain Assessment Pain Assessment: No/denies pain  Home Living                                            Prior Functioning/Environment              Frequency  Min 2X/week        Progress Toward Goals  OT Goals(current goals can now be found in the care plan section)  Progress towards OT goals: Progressing toward goals     Plan Discharge plan remains appropriate;Frequency remains appropriate    Co-evaluation                 AM-PAC OT "6 Clicks" Daily Activity     Outcome Measure   Help from another person eating meals?: A Little Help from another person taking care of personal grooming?: A Little Help from another person toileting, which includes using toliet, bedpan, or urinal?: A Little Help from another person bathing (including washing, rinsing, drying)?: A Lot Help from  another person to put on and taking off regular upper body clothing?: A Little Help from another person to put on and taking off regular lower body clothing?: A Lot 6 Click Score: 16    End of Session Equipment Utilized During Treatment: Other (comment) (reacher)  OT Visit Diagnosis: Unsteadiness on feet (R26.81)   Activity Tolerance Patient limited by fatigue   Patient Left in bed;with call bell/phone within reach;with bed alarm set;with family/visitor present   Nurse Communication Mobility status        Time: 1448-1502 OT Time Calculation (min): 14 min  Charges: OT General Charges $OT Visit: 1 Visit OT Treatments $Self Care/Home Management : 8-22 mins Anna Unwin, OTD OTR/L  03/07/22, 3:49 PM  

## 2022-03-07 NOTE — Progress Notes (Signed)
Physical Therapy Treatment Patient Details Name: Debra [name] MRN: [id number] DOB: 04-30-1966 Today's Date: 03/07/2022   History of Present Illness Pt is a 56 y.o. female with PMH including hypothyroidism, iron deficient anemia, colon cancer.  Here s/p mass removal with hernia repair and appendectomy 6/8, bowel perforation repair 6/10.    PT Comments    Patient alert, agreeable to PT, and daughter performed interpretor services throughout session. Supine to sit with minA, extended time. Sit <> stand 3-4 times with CGA. Able to stand pivot to [facility] and perform pericare with supervision. She also ambulated ~64f very slowly and carefully. Some SOB noted but spO2 95%. Sitting at EOB with OT at end of session. The patient would benefit from further skilled PT intervention to continue to progress towards goals. Recommendation remains appropriate.     Recommendations for follow up therapy are one component of a multi-disciplinary discharge planning process, led by the attending physician.  Recommendations may be updated based on patient status, additional functional criteria and insurance authorization.  Follow Up Recommendations  Skilled nursing-short term rehab (<3 hours/day) Can patient physically be transported by private vehicle: Yes   Assistance Recommended at Discharge Frequent or constant Supervision/Assistance  Patient can return home with the following Help with stairs or ramp for entrance;Assist for transportation;Assistance with cooking/housework;A lot of help with bathing/dressing/bathroom;A little help with walking and/or transfers   Equipment Recommendations  Rolling walker (2 wheels);Hospital bed;Wheelchair cushion (measurements PT);Wheelchair (measurements PT);BSC/3in1    Recommendations for Other Services       Precautions / Restrictions Precautions Precautions: Fall Precaution Comments: multiple drains Restrictions Weight Bearing Restrictions: No Other  Position/Activity Restrictions: Abdominal surgery     Mobility  Bed Mobility Overal bed mobility: Needs Assistance Bed Mobility: Sit to Supine     Supine to sit: Min assist, HOB elevated          Transfers Overall transfer level: Needs assistance Equipment used: None Transfers: Sit to/from Stand Sit to Stand: Min guard   Step pivot transfers: Min guard            Ambulation/Gait Ambulation/Gait assistance: Min guard Gait Distance (Feet): 6 Feet Assistive device: None   Gait velocity: decreased     General Gait Details: limited ability to ambulate due to lines/leads, pt did appear more SOB today, spO2 95% on room air   Stairs             Wheelchair Mobility    Modified Rankin (Stroke Patients Only)       Balance                                            Cognition Arousal/Alertness: Awake/alert Behavior During Therapy: WFL for tasks assessed/performed, Flat affect Overall Cognitive Status: Within Functional Limits for tasks assessed                                 General Comments: Pt pleasant and agreeable to PT. Follows all commands. Pt's daughter used for interpretation        Exercises      General Comments        Pertinent Vitals/Pain Pain Assessment Pain Assessment: No/denies pain    Home Living  Prior Function            PT Goals (current goals can now be found in the care plan section) Progress towards PT goals: Progressing toward goals    Frequency    Min 2X/week      PT Plan Current plan remains appropriate    Co-evaluation              AM-PAC PT "6 Clicks" Mobility   Outcome Measure  Help needed turning from your back to your side while in a flat bed without using bedrails?: A Little Help needed moving from lying on your back to sitting on the side of a flat bed without using bedrails?: A Little Help needed moving to and from a bed  to a chair (including a wheelchair)?: A Little Help needed standing up from a chair using your arms (e.g., wheelchair or bedside chair)?: A Little Help needed to walk in hospital room?: A Lot Help needed climbing 3-5 steps with a railing? : A Lot 6 Click Score: 16    End of Session Equipment Utilized During Treatment: Gait belt Activity Tolerance: Patient tolerated treatment well;Patient limited by fatigue Patient left: in bed;with call bell/phone within reach;with bed alarm set;with family/visitor present Nurse Communication: Mobility status PT Visit Diagnosis: Muscle weakness (generalized) (M62.81);Difficulty in walking, not elsewhere classified (R26.2)     Time: 1610-9604 PT Time Calculation (min) (ACUTE ONLY): 17 min  Charges:  $Therapeutic Activity: 8-22 mins                     Lieutenant Diego PT, DPT 4:02 PM,03/07/22

## 2022-03-07 NOTE — Progress Notes (Signed)
[place] Hospital Day(s): 22.   Post op day(s): 20 Days Post-Op.   Interval History:  Patient seen and examined No fever in last 24 hours; last fever was 06/28 at 0800 (100.39F) --> Restarted on Zosyn 06/27 by ID No abdominal pain She did have an episode of emesis last night  Remains without leukocytosis; 7.6K NGT discontinued on 06/22; doing well Surgical Drains:   - Left drain (Retro Rectus) 5 ccs  - Right Drain (Intraperitoneal) 10 ccs [name] Pouch output 1315 ccs in last 24 hours She is on FLD; on TPN Working with therapies; recommending SNF  Pathology reviewed:  -- Metastatic adenocarcinoma; colon origin -- Plan for output follow up with oncology (Dr Tasia Catchings)  Vital signs in last 24 hours: [min-max] current  Temp:  [98 F (36.7 C)-98.9 F (37.2 C)] 98.6 F (37 C) (06/30 0315) Pulse Rate:  [87-108] 101 (06/30 0315) Resp:  [20] 20 (06/30 0315) BP: (99-116)/(59-63) 111/61 (06/30 0315) SpO2:  [92 %-97 %] 93 % (06/30 0315)     Height: '4\' 11"'$  (149.9 cm) Weight: 91 kg BMI (Calculated): 40.5   Intake/Output last 2 shifts:  06/29 0701 - 06/30 0700 In: 1302.6 [I.V.:952.5; IV Piggyback:350.1] Out: 1565 [Emesis/NG output:250; Drains:1315]   Physical Exam:  Constitutional: alert, cooperative and no distress  Respiratory: breathing non-labored at rest  Cardiovascular: regular rate and sinus rhythm  Gastrointestinal: Soft, abdominal soreness on the right, non-distended, no rebound/guarding. Surgical drain in LLQ; serosanguinous, RLQ drain output slowing; bilious Integumentary: Again, the superior portion of the anterior fascia has dehisced with exposed mesh now with bilious drainage consistent with EC fistula. Subcutaneous tissues are starting to granulate. [name] Pouch in place with good seal, red rubber present to manage effluent   Labs:     Latest Ref Rng & Units 03/07/2022    5:44 AM 03/03/2022    6:44 AM 03/01/2022    7:08 AM  CBC  WBC  4.0 - 10.5 K/uL 7.6  7.7  6.6   Hemoglobin 12.0 - 15.0 g/dL 8.1  8.3  9.5   Hematocrit 36.0 - 46.0 % 24.8  26.0  29.0   Platelets 150 - 400 K/uL 189  209  249       Latest Ref Rng & Units 03/06/2022    5:51 AM 03/03/2022    6:44 AM 03/01/2022    7:08 AM  CMP  Glucose 70 - 99 mg/dL 182  180  192   BUN 6 - 20 mg/dL '30  21  18   '$ Creatinine 0.44 - 1.00 mg/dL 0.61  0.45  0.42   Sodium 135 - 145 mmol/L 135  133  133   Potassium 3.5 - 5.1 mmol/L 4.7  4.4  4.1   Chloride 98 - 111 mmol/L 105  105  105   CO2 22 - 32 mmol/L '25  22  24   '$ Calcium 8.9 - 10.3 mg/dL 8.0  7.8  7.7   Total Protein 6.5 - 8.1 g/dL 6.5  6.4    Total Bilirubin 0.3 - 1.2 mg/dL 0.4  0.3    Alkaline Phos 38 - 126 U/L 101  99    AST 15 - 41 U/L 25  32    ALT 0 - 44 U/L 23  25       Imaging studies: No new pertinent imaging studies    Assessment/Plan: 56 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   - Wound Care: [name] pouch in place with red rubber present to manage drainage. Anticipate fluid will pool in the wound bed while she is in bed. This will be a mix of serosanguinous fluid and bilious fluid. It is okay to adjust the red rubber catheter from time to time to help with drainage while in bed. This drains well while she is ambulatory and sitting up given dependent location of the drain. These pouches need changed every 7-10 days (ideally). Will change today (06/30) with assistance of WOC  - Continue FLD as tolerated + nutritional supplementation - Continue TPN at goal; monitor electrolytes - Continue octreotide; 200 mcg/min TID, continue imodium   - ID on board; restarted on Zosyn 06/27  - Continue surgical drains; monitor and record output  - Monitor abdominal  examination; on-going bowel function  - Pain control prn; antemetic prn  - Out of bed; therapies on board; recommendations are HHPT   - Discharge Planning: Anticipate lengthy admission   All of the above findings and recommendations were discussed with the patient, and the medical team, and all of patient's questions were answered to her expressed satisfaction.  -- [name], PA-C [facility] 03/07/2022, 7:17 AM M-F: 7am - 4pm

## 2022-03-07 NOTE — Progress Notes (Addendum)
PHARMACY - TOTAL PARENTERAL NUTRITION CONSULT NOTE   Indication: Prolonged ileus  Patient Measurements: Height: '4\' 11"'$  (149.9 cm) Weight: 91 kg (200 lb 9.9 oz) IBW/kg (Calculated) : 43.2 TPN AdjBW (KG): 55.2 Body mass index is 40.52 kg/m.  Assessment:  56 y.o. female  s/p laparotomy, excision of greater omental mass, abdominal wall reconstruction with [name],appendectomy, and placement of Prevena vac.  Glucose / Insulin: no insulin requirements prior to TPN --insulin requirements previous 24h: Novolog 15 units and 44 units of regular insulin in TPN bag (BG 173-208) Electrolytes: WNL Renal: Scr<1 Hepatic: LFTs wnl Intake / Output; MIVF:(+11.5L this admit/ NS stopped 6/22) GI Imaging: GI Surgeries / Procedures: s/p laparotomy, excision of greater omental mass, abdominal wall reconstruction with [name],appendectomy, and placement of Prevena vac  Central access: 02/15/22 TPN start date: 02/15/22  Nutritional Goals: Goal TPN rate was 75 mL/hr, but now decreasing to 35m/hr as patient looks to be fluid overloaded(+11.5L) (provides 106 g of protein and 1872 kcals per day)  RD Assessment:  Estimated Needs Total Energy Estimated Needs: 1800-2100kcal/day Total Protein Estimated Needs: 90-110g/d Total Fluid Estimated Needs: 1.4-1.6L/day  Current Nutrition: Full liquid  Plan:  Decrease TPN to 683mhr(new goal rate) Nutritional Components Amino acids (using 15% Clinisol): 106 grams Dextrose: 265.2 grams Lipids (using 20% SMOFlipids): 54.6 grams kCal: 1872/24h  Electrolytes in TPN (standard): Na 5028mL, K 63m61m, Ca 5mEq40m Mg 5mEq/68mand Phos 25 mmol/L. Cl:Ac 1:1 Add trace elements to TPN, MVI changed to PO  Continue insulin 44 units in TPN SSI three times daily with meals and adjust as needed Monitor TPN labs on Mon/Thurs  Dazaria Macneill Pearla DubonnetmD 03/07/2022 11:42 AM

## 2022-03-07 NOTE — Consult Note (Signed)
[facility] Nurse fistula consult note Fistula type/location: EC midline wound; large open abdominal wound with exposed mesh Perifistula assessment: intact Treatment options for perifistula skin: [name] skin barrier used to window frame the wound Output liquid brown/red; high volumes.  Surgery at bedside, would like red rubber to be in the base/distal edge of the wound bed  Fistula pouching: large [name]  Education provided: NA, patient to be hospitalized for months per surgery   WOC nursing team will follow along for support with fistula care and teaching   2 [name] pouches ordered. Extra red rubber in the room  [place], [place], [place]

## 2022-03-08 ENCOUNTER — Inpatient Hospital Stay: Payer: Medicaid Other

## 2022-03-08 LAB — COMPREHENSIVE METABOLIC PANEL
ALT: 22 U/L (ref 0–44)
AST: 32 U/L (ref 15–41)
Albumin: 2.2 g/dL — ABNORMAL LOW (ref 3.5–5.0)
Alkaline Phosphatase: 132 U/L — ABNORMAL HIGH (ref 38–126)
Anion gap: 9 (ref 5–15)
BUN: 29 mg/dL — ABNORMAL HIGH (ref 6–20)
CO2: 21 mmol/L — ABNORMAL LOW (ref 22–32)
Calcium: 8 mg/dL — ABNORMAL LOW (ref 8.9–10.3)
Chloride: 101 mmol/L (ref 98–111)
Creatinine, Ser: 0.76 mg/dL (ref 0.44–1.00)
GFR, Estimated: >60 mL/min (ref >60)
Glucose, Bld: 157 mg/dL — ABNORMAL HIGH (ref 70–99)
Potassium: 4.7 mmol/L (ref 3.5–5.1)
Sodium: 131 mmol/L — ABNORMAL LOW (ref 135–145)
Total Bilirubin: 1 mg/dL (ref 0.3–1.2)
Total Protein: 6.9 g/dL (ref 6.5–8.1)

## 2022-03-08 LAB — GLUCOSE, CAPILLARY
Glucose-Capillary: 242 mg/dL — ABNORMAL HIGH (ref 70–99)
Glucose-Capillary: 225 mg/dL — ABNORMAL HIGH (ref 70–99)
Glucose-Capillary: 204 mg/dL — ABNORMAL HIGH (ref 70–99)
Glucose-Capillary: 110 mg/dL — ABNORMAL HIGH (ref 70–99)
Glucose-Capillary: 246 mg/dL — ABNORMAL HIGH (ref 70–99)

## 2022-03-08 LAB — CBC
HCT: 31.2 % — ABNORMAL LOW (ref 36.0–46.0)
Hemoglobin: 9.8 g/dL — ABNORMAL LOW (ref 12.0–15.0)
MCH: 28 pg (ref 26.0–34.0)
MCHC: 31.4 g/dL (ref 30.0–36.0)
MCV: 89.1 fL (ref 80.0–100.0)
Platelets: 233 K/uL (ref 150–400)
RBC: 3.5 MIL/uL — ABNORMAL LOW (ref 3.87–5.11)
RDW: 15.1 % (ref 11.5–15.5)
WBC: 5.5 K/uL (ref 4.0–10.5)
nRBC: 2.9 % — ABNORMAL HIGH (ref 0.0–0.2)

## 2022-03-08 MED ORDER — INSULIN GLARGINE-YFGN 100 UNIT/ML ~~LOC~~ SOLN
10.0000 [IU] | Freq: Once | SUBCUTANEOUS | Status: AC
  Administered 2022-03-08: 10 [IU] via SUBCUTANEOUS
  Filled 2022-03-08: qty 0.1

## 2022-03-08 MED ORDER — TRACE MINERALS CU-MN-SE-ZN 300-55-60-3000 MCG/ML IV SOLN
INTRAVENOUS | Status: AC
  Filled 2022-03-08: qty 707.2

## 2022-03-08 NOTE — Progress Notes (Signed)
Subjective:  CC: Debra [name] is a 56 y.o. female  Hospital stay day 23, 21 Days Post-Op   s/p re-opening of laparotomy for repair of small bowel perforation and 15 days s/p laparotomy, excision of greater omental mass, abdominal wall reconstruction with [name],appendectomy, and placement of Prevena vac.  HPI: Another episode of emesis today. Still denies any pain.  Last PO intake was yesterday.    ROS:  General: Denies weight loss, weight gain, fatigue, fevers, chills, and night sweats. Heart: Denies chest pain, palpitations, racing heart, irregular heartbeat, leg pain or swelling, and decreased activity tolerance. Respiratory: Denies breathing difficulty, shortness of breath, wheezing, cough, and sputum. GI: Denies change in appetite, heartburn, constipation, diarrhea, and blood in stool. GU: Denies difficulty urinating, pain with urinating, urgency, frequency, blood in urine.   Objective:   Temp:  [98.4 F (36.9 C)-99.8 F (37.7 C)] 98.4 F (36.9 C) (07/01 0546) Pulse Rate:  [93-100] 93 (07/01 0546) Resp:  [18-20] 20 (07/01 0546) BP: (104-114)/(66-69) 104/69 (07/01 0546) SpO2:  [93 %-96 %] 96 % (07/01 0546)     Height: '4\' 11"'$  (149.9 cm) Weight: 91 kg BMI (Calculated): 40.5   Intake/Output this shift:   Intake/Output Summary (Last 24 hours) at 03/08/2022 1230 Last data filed at 03/08/2022 4580 Gross per 24 hour  Intake 1613.37 ml  Output 310 ml  Net 1303.37 ml    Constitutional :  alert, cooperative, appears stated age, and no distress  Respiratory:  clear to auscultation bilaterally  Cardiovascular:  regular rate and rhythm  Gastrointestinal: Soft, non-tender, eakin pouch intact, drain with bilious output as expected   JP drain with scant serous fluid at time of exam.   Skin: Cool and moist.   Psychiatric: Normal affect, non-agitated, not confused       LABS:     Latest Ref Rng & Units 03/06/2022    5:51 AM 03/03/2022    6:44 AM 03/01/2022    7:08  AM  CMP  Glucose 70 - 99 mg/dL 182  180  192   BUN 6 - 20 mg/dL '30  21  18   '$ Creatinine 0.44 - 1.00 mg/dL 0.61  0.45  0.42   Sodium 135 - 145 mmol/L 135  133  133   Potassium 3.5 - 5.1 mmol/L 4.7  4.4  4.1   Chloride 98 - 111 mmol/L 105  105  105   CO2 22 - 32 mmol/L '25  22  24   '$ Calcium 8.9 - 10.3 mg/dL 8.0  7.8  7.7   Total Protein 6.5 - 8.1 g/dL 6.5  6.4    Total Bilirubin 0.3 - 1.2 mg/dL 0.4  0.3    Alkaline Phos 38 - 126 U/L 101  99    AST 15 - 41 U/L 25  32    ALT 0 - 44 U/L 23  25        Latest Ref Rng & Units 03/07/2022    5:44 AM 03/03/2022    6:44 AM 03/01/2022    7:08 AM  CBC  WBC 4.0 - 10.5 K/uL 7.6  7.7  6.6   Hemoglobin 12.0 - 15.0 g/dL 8.1  8.3  9.5   Hematocrit 36.0 - 46.0 % 24.8  26.0  29.0   Platelets 150 - 400 K/uL 189  209  249     RADS: N/a Assessment:    s/p re-opening of laparotomy for repair of small bowel perforation and 15 days s/p laparotomy, excision of  greater omental mass, abdominal wall reconstruction with [name],appendectomy, and placement of Prevena vac.  Back to NPO due to emesis episode again, not sure if she is developing ileus again, possible early obstruction?  Will obtain labs and abd xray to see if changes.  Symptomatic treatment in the meantime  labs/images/medications/previous chart entries reviewed personally and relevant changes/updates noted above.

## 2022-03-08 NOTE — Progress Notes (Signed)
PHARMACY - TOTAL PARENTERAL NUTRITION CONSULT NOTE   Indication: Prolonged ileus  Patient Measurements: Height: '4\' 11"'$  (149.9 cm) Weight: 91 kg (200 lb 9.9 oz) IBW/kg (Calculated) : 43.2 TPN AdjBW (KG): 55.2 Body mass index is 40.52 kg/m.  Assessment:  56 y.o. female  s/p laparotomy, excision of greater omental mass, abdominal wall reconstruction with [name],appendectomy, and placement of Prevena vac.  Glucose / Insulin: no insulin requirements prior to TPN --insulin requirements previous 24h: Novolog 18 units and 44 units of regular insulin in TPN bag  up until 1800 on 6/30 (BG 242-271)  *(TPN bag was discarded on floor the on 6/30 and had to hang D10W until can get new TPN on 7/1) *SSI ordered 3 times daily w/ meals, CBG ordered q6h  Electrolytes: WNL Renal: Scr<1 Hepatic: LFTs wnl Intake / Output; MIVF: D10W hung in place of TPN on 6/30- to stop at 1800 when new TPN hung GI Imaging: GI Surgeries / Procedures: s/p laparotomy, excision of greater omental mass, abdominal wall reconstruction with [name],appendectomy, and placement of Prevena vac  Central access: 02/15/22 TPN start date: 02/15/22  Nutritional Goals: Goal TPN rate was 75 mL/hr, but now decreasing to 57m/hr as patient looks to be fluid overloaded(+11.5L) (provides 106 g of protein and 1872 kcals per day)  RD Assessment:  Estimated Needs Total Energy Estimated Needs: 1800-2100kcal/day Total Protein Estimated Needs: 90-110g/d Total Fluid Estimated Needs: 1.4-1.6L/day  Current Nutrition: Full liquid  Plan:  Decrease TPN to 67mhr(new goal rate) Nutritional Components Amino acids (using 15% Clinisol): 106 grams Dextrose: 265.2 grams Lipids (using 20% SMOFlipids): 54.6 grams kCal: 1872/24h  Electrolytes in TPN (standard): Na 5062mL, K 69m51m, Ca 5mEq76m Mg 5mEq/76mand Phos 25 mmol/L. Cl:Ac 1:1 Add trace elements to TPN, MVI changed to PO  TPN not hung 6/30 evening-(discarded  on floor by RN) so D10W hung in place till new TPN bag available BG increased-likely in part to D10W- will order Semglee 10 units x 1 dose. Will slightly adjust insulin to 30 units in TPN SSI three times daily with meals and adjust as needed- May need to adjust SSI to q6h? Will check labs am 7/2 given disruption in TPN  Monitor TPN labs on Mon/Thurs  Hiilei Gerst A, PharmD 03/08/2022 9:05 AM

## 2022-03-08 NOTE — Consult Note (Signed)
Pharmacy Antibiotic Note  Debra [name] is a 56 y.o. female with PMH including colon cancer admitted on 02/13/2022 for excision of pelvic mass from greater omentum, abdominal wall reconstruction, recurrent incisional hernia repair, and appendectomy. Patient returned to OR 6/10 for reopening of laparotomy for repair of small bowel perforation. ID has been following for intraabdominal abscesses secondary to bowel perforation. Pharmacy has been consulted for Zosyn dosing.  Plan: Zosyn 3.375g IV q8h (4 hour infusion).  Height: '4\' 11"'$  (149.9 cm) Weight: 91 kg (200 lb 9.9 oz) IBW/kg (Calculated) : 43.2  Temp (24hrs), Avg:99.2 F (37.3 C), Min:98.4 F (36.9 C), Max:99.8 F (37.7 C)  Recent Labs  Lab 03/03/22 0644 03/06/22 0551 03/07/22 0544  WBC 7.7  --  7.6  CREATININE 0.45 0.61  --      Estimated Creatinine Clearance: 75.4 mL/min (by C-G formula based on SCr of 0.61 mg/dL).    No Known Allergies  Antimicrobials this admission: Cefotetan 6/8 >> 6/9 (surgical prophylaxis) Zosyn 6/10 >> 6/16, 6/27 >> Vancomycin 6/14 >> 6/18 Fluconazole 6/14 >> 6/27 Meropenem 6/16 >> 6/22  Dose adjustments this admission: N/A  Microbiology results: 6/12 BCx: NG 6/28 Bcx NGx3d 6/13 BCx: NG 6/15 LLQ drain: Candida albicans  Thank you for allowing pharmacy to be a part of this patient's care.  Shaya Altamura A 03/08/2022 9:23 AM

## 2022-03-08 DEATH — deceased

## 2022-03-09 LAB — GLUCOSE, CAPILLARY
Glucose-Capillary: 195 mg/dL — ABNORMAL HIGH (ref 70–99)
Glucose-Capillary: 189 mg/dL — ABNORMAL HIGH (ref 70–99)
Glucose-Capillary: 173 mg/dL — ABNORMAL HIGH (ref 70–99)
Glucose-Capillary: 145 mg/dL — ABNORMAL HIGH (ref 70–99)

## 2022-03-09 LAB — BASIC METABOLIC PANEL
Anion gap: 7 (ref 5–15)
BUN: 40 mg/dL — ABNORMAL HIGH (ref 6–20)
CO2: 22 mmol/L (ref 22–32)
Calcium: 7.2 mg/dL — ABNORMAL LOW (ref 8.9–10.3)
Chloride: 101 mmol/L (ref 98–111)
Creatinine, Ser: 0.9 mg/dL (ref 0.44–1.00)
GFR, Estimated: >60 mL/min (ref >60)
Glucose, Bld: 274 mg/dL — ABNORMAL HIGH (ref 70–99)
Potassium: 4.5 mmol/L (ref 3.5–5.1)
Sodium: 130 mmol/L — ABNORMAL LOW (ref 135–145)

## 2022-03-09 LAB — PHOSPHORUS: Phosphorus: 3.5 mg/dL (ref 2.5–4.6)

## 2022-03-09 LAB — MAGNESIUM: Magnesium: 2.3 mg/dL (ref 1.7–2.4)

## 2022-03-09 MED ORDER — INSULIN GLARGINE-YFGN 100 UNIT/ML ~~LOC~~ SOLN
10.0000 [IU] | Freq: Once | SUBCUTANEOUS | Status: AC
Start: 2022-03-09 — End: 2022-03-09
  Administered 2022-03-09: 10 [IU] via SUBCUTANEOUS
  Filled 2022-03-09: qty 0.1

## 2022-03-09 MED ORDER — SODIUM CHLORIDE 0.9 % IV SOLN: INTRAVENOUS | Status: DC

## 2022-03-09 MED ORDER — TRACE MINERALS CU-MN-SE-ZN 300-55-60-3000 MCG/ML IV SOLN
INTRAVENOUS | Status: AC
  Filled 2022-03-09: qty 707.2

## 2022-03-09 MED ORDER — SODIUM CHLORIDE 0.9 % IV BOLUS
500.0000 mL | Freq: Once | INTRAVENOUS | Status: AC
  Administered 2022-03-09: 500 mL via INTRAVENOUS

## 2022-03-09 NOTE — Progress Notes (Signed)
PT Cancellation Note  Patient Details Name: Debra [name] MRN: [id number] DOB: Jul 13, 1966   Cancelled Treatment:    Reason Eval/Treat Not Completed: Fatigue/lethargy limiting ability to participate  In house interpreter [place] in attendance.  Pt offered and encouraged mobility.  Stated she wanted to wait until after lunch as she has not eaten yet today and feels "weak".  Will return later as requested.   Chesley Noon 03/09/2022, 11:36 AM

## 2022-03-09 NOTE — Progress Notes (Signed)
Physical Therapy Treatment Patient Details Name: Debra [name] MRN: [id number] DOB: [birth year] Today's Date: 03/09/2022   History of Present Illness Pt is a 56 y.o. female with PMH including hypothyroidism, iron deficient anemia, colon cancer.  Here s/p mass removal with hernia repair and appendectomy 6/8, bowel perforation repair 6/10.    PT Comments    Patient is in bed with daughter present upon PT arrival. Patient's daughter agreeable to act as interpreter, patient agreeable to treatment session. CNA came into room to assist with changing bed while patient is out of bed with PT. Patient is min A for bed mobility and CGA for STS. Upon standing EOB she is able to march with RW without LOB or pain increase. Patient then ambulated in room with two practice turns, one of which is full 180 degree turn. Patient returned to bed with needs met and repositioned for comfort. Current POC remains appropriate at this time.     Recommendations for follow up therapy are one component of a multi-disciplinary discharge planning process, led by the attending physician.  Recommendations may be updated based on patient status, additional functional criteria and insurance authorization.  Follow Up Recommendations  Skilled nursing-short term rehab (<3 hours/day) Can patient physically be transported by private vehicle: Yes   Assistance Recommended at Discharge Frequent or constant Supervision/Assistance  Patient can return home with the following Help with stairs or ramp for entrance;Assist for transportation;Assistance with cooking/housework;A lot of help with bathing/dressing/bathroom;A little help with walking and/or transfers   Equipment Recommendations  Rolling walker (2 wheels);Hospital bed;Wheelchair cushion (measurements PT);Wheelchair (measurements PT);BSC/3in1    Recommendations for Other Services       Precautions / Restrictions Precautions Precautions: Fall Precaution Comments: multiple  drains Restrictions Weight Bearing Restrictions: No Other Position/Activity Restrictions: Abdominal surgery     Mobility  Bed Mobility Overal bed mobility: Needs Assistance Bed Mobility: Sit to Supine, Supine to Sit     Supine to sit: Min guard, HOB elevated Sit to supine: Min assist, HOB elevated   General bed mobility comments: extra time and min A    Transfers Overall transfer level: Needs assistance Equipment used: Standard walker Transfers: Sit to/from Stand Sit to Stand: Min guard           General transfer comment: cues for sequencing and safety. assistance for managing lines and leads    Ambulation/Gait Ambulation/Gait assistance: Min guard Gait Distance (Feet): 17 Feet Assistive device: Rolling walker (2 wheels) Gait Pattern/deviations: Step-to pattern, Decreased step length - right, Decreased step length - left, Decreased stride length, Wide base of support Gait velocity: decreased     General Gait Details: Short, slow steps   Stairs             Wheelchair Mobility    Modified Rankin (Stroke Patients Only)       Balance Overall balance assessment: Needs assistance Sitting-balance support: Feet supported Sitting balance-Leahy Scale: Good     Standing balance support: Reliant on assistive device for balance, During functional activity Standing balance-Leahy Scale: Fair Standing balance comment: requires use of AD with ambulation                            Cognition Arousal/Alertness: Awake/alert Behavior During Therapy: WFL for tasks assessed/performed, Flat affect Overall Cognitive Status: Within Functional Limits for tasks assessed  General Comments: Patient's daughter used for interpretation        Exercises Other Exercises Other Exercises: Patient educated on safe mobility and transfers. Performed standing balance.    General Comments General comments (skin integrity,  edema, etc.): eager to participate and get out of bed      Pertinent Vitals/Pain Pain Assessment Pain Assessment: Faces Faces Pain Scale: Hurts a little bit Pain Location: abdomen with transition to EOB Pain Descriptors / Indicators: Grimacing, Discomfort Pain Intervention(s): Monitored during session    Home Living                          Prior Function            PT Goals (current goals can now be found in the care plan section) Acute Rehab PT Goals Patient Stated Goal: to go home PT Goal Formulation: With patient Time For Goal Achievement: 03/21/22 Potential to Achieve Goals: Fair Progress towards PT goals: Progressing toward goals    Frequency    Min 2X/week      PT Plan Current plan remains appropriate    Co-evaluation              AM-PAC PT "6 Clicks" Mobility   Outcome Measure  Help needed turning from your back to your side while in a flat bed without using bedrails?: A Little Help needed moving from lying on your back to sitting on the side of a flat bed without using bedrails?: A Little Help needed moving to and from a bed to a chair (including a wheelchair)?: A Little Help needed standing up from a chair using your arms (e.g., wheelchair or bedside chair)?: A Little Help needed to walk in hospital room?: A Lot Help needed climbing 3-5 steps with a railing? : A Lot 6 Click Score: 16    End of Session Equipment Utilized During Treatment: Gait belt Activity Tolerance: Patient tolerated treatment well;Patient limited by fatigue Patient left: in bed;with call bell/phone within reach;with bed alarm set;with family/visitor present Nurse Communication: Mobility status PT Visit Diagnosis: Muscle weakness (generalized) (M62.81);Difficulty in walking, not elsewhere classified (R26.2)     Time: 4035-2481 PT Time Calculation (min) (ACUTE ONLY): 26 min  Charges:  $Therapeutic Exercise: 8-22 mins $Therapeutic Activity: 8-22 mins                      Janna Arch, PT, DPT  03/09/2022, 2:11 PM

## 2022-03-09 NOTE — Progress Notes (Signed)
PHARMACY - TOTAL PARENTERAL NUTRITION CONSULT NOTE   Indication: Prolonged ileus  Patient Measurements: Height: '4\' 11"'$  (149.9 cm) Weight: 91 kg (200 lb 9.9 oz) IBW/kg (Calculated) : 43.2 TPN AdjBW (KG): 55.2 Body mass index is 40.52 kg/m.  Assessment:  56 y.o. female  s/p laparotomy, excision of greater omental mass, abdominal wall reconstruction with [name],appendectomy, and placement of Prevena vac.  Glucose / Insulin: no insulin requirements prior to TPN BG 110-274 --insulin requirements previous 24h: 10 units novolog SSI and 30 units of regular insulin in TPN bag  -*(TPN bag was discarded on floor the on 6/30 and had to hang D10W until new TPN on 7/1) *SSI ordered 3 times daily w/ meals, CBG ordered q6h -adjusted insulin to 30 units in TPN on 7/1 as ordered glargine  10 units x1 also   Electrolytes: WNL, Na 130   corrected Ca 8.6 Renal: Scr<1 Hepatic: LFTs wnl Intake / Output; MIVF: D10W hung in place of TPN on 6/30- to stop at 1800 when new TPN hung GI Imaging: GI Surgeries / Procedures: s/p laparotomy, excision of greater omental mass, abdominal wall reconstruction with [name],appendectomy, and placement of Prevena vac  Central access: 02/15/22 TPN start date: 02/15/22  Nutritional Goals: Goal TPN rate was 75 mL/hr, but now decreasing to 23m/hr as patient looks to be fluid overloaded(+11.5L) (provides 106 g of protein and 1872 kcals per day)  RD Assessment:  Estimated Needs Total Energy Estimated Needs: 1800-2100kcal/day Total Protein Estimated Needs: 90-110g/d Total Fluid Estimated Needs: 1.4-1.6L/day  Current Nutrition: resuming CLD 7/2  Plan:  Decrease TPN to 674mhr(new goal rate) Nutritional Components Amino acids (using 15% Clinisol): 106 grams Dextrose: 265.2 grams Lipids (using 20% SMOFlipids): 54.6 grams kCal: 1872/24h  Electrolytes in TPN (standard): Na 5018mL, K 69m37m, Ca 5mEq59m Mg 5mEq/17mand Phos 25 mmol/L. Cl:Ac  1:1 Add trace elements to TPN, MVI changed to PO  *TPN not hung 6/30 evening-(discarded by floor RN) so D10W hung in place till new TPN bag available on 7/1* BG increased-will order Glargine(semglee)10 units x 1 dose again today 7/2. Will continue insulin 30 units in TPN SSI three times daily with meals (CBG q6h)- May need to adjust SSI to q6h? Monitor TPN labs on Mon/Thurs  Zaleigh Bermingham A, PharmD 03/09/2022 10:33 AM

## 2022-03-09 NOTE — Progress Notes (Signed)
Subjective:  CC: Debra [name] is a 56 y.o. female  Hospital stay day 24, 22 Days Post-Op   s/p re-opening of laparotomy for repair of small bowel perforation and 16 days s/p laparotomy, excision of greater omental mass, abdominal wall reconstruction with [name],appendectomy, and placement of Prevena vac.  HPI: No further episodes of emesis since NPO. Still denies any pain.  No other complaints  ROS:  General: Denies weight loss, weight gain, fatigue, fevers, chills, and night sweats. Heart: Denies chest pain, palpitations, racing heart, irregular heartbeat, leg pain or swelling, and decreased activity tolerance. Respiratory: Denies breathing difficulty, shortness of breath, wheezing, cough, and sputum. GI: Denies change in appetite, heartburn, constipation, diarrhea, and blood in stool. GU: Denies difficulty urinating, pain with urinating, urgency, frequency, blood in urine.   Objective:   Temp:  [97.7 F (36.5 C)-98.9 F (37.2 C)] 98.4 F (36.9 C) (07/02 0817) Pulse Rate:  [78-90] 78 (07/02 0817) Resp:  [16-18] 16 (07/02 0817) BP: (80-101)/(51-56) 96/55 (07/02 0918) SpO2:  [96 %-100 %] 96 % (07/02 0817)     Height: '4\' 11"'$  (149.9 cm) Weight: 91 kg BMI (Calculated): 40.5   Intake/Output this shift:   Intake/Output Summary (Last 24 hours) at 03/09/2022 0949 Last data filed at 03/09/2022 6789 Gross per 24 hour  Intake 719.7 ml  Output 1285 ml  Net -565.3 ml    Constitutional :  alert, cooperative, appears stated age, and no distress  Respiratory:  clear to auscultation bilaterally  Cardiovascular:  regular rate and rhythm  Gastrointestinal: Soft, non-tender, eakin pouch intact, drain with bilious output as expected   JP drain slight thicker serous fluid at time of exam. No obvious purulence   Skin: Cool and moist.   Psychiatric: Normal affect, non-agitated, not confused       LABS:     Latest Ref Rng & Units 03/09/2022    3:50 AM 03/08/2022    2:35 PM  03/06/2022    5:51 AM  CMP  Glucose 70 - 99 mg/dL 274  157  182   BUN 6 - 20 mg/dL 40  29  30   Creatinine 0.44 - 1.00 mg/dL 0.90  0.76  0.61   Sodium 135 - 145 mmol/L 130  131  135   Potassium 3.5 - 5.1 mmol/L 4.5  4.7  4.7   Chloride 98 - 111 mmol/L 101  101  105   CO2 22 - 32 mmol/L '22  21  25   '$ Calcium 8.9 - 10.3 mg/dL 7.2  8.0  8.0   Total Protein 6.5 - 8.1 g/dL  6.9  6.5   Total Bilirubin 0.3 - 1.2 mg/dL  1.0  0.4   Alkaline Phos 38 - 126 U/L  132  101   AST 15 - 41 U/L  32  25   ALT 0 - 44 U/L  22  23       Latest Ref Rng & Units 03/08/2022    2:35 PM 03/07/2022    5:44 AM 03/03/2022    6:44 AM  CBC  WBC 4.0 - 10.5 K/uL 5.5  7.6  7.7   Hemoglobin 12.0 - 15.0 g/dL 9.8  8.1  8.3   Hematocrit 36.0 - 46.0 % 31.2  24.8  26.0   Platelets 150 - 400 K/uL 233  189  209     RADS: CLINICAL DATA:  Gastrointestinal surgery.  Post surgical vomiting.   EXAM: ABDOMEN - 1 VIEW   COMPARISON:  02/18/2022.  CT, 03/05/2022.   FINDINGS: A surgical drain extends across the lower abdomen.   Normal bowel gas pattern. Specifically, no significant bowel dilation to suggest obstruction.   Soft tissues unremarkable. Mild opacity at the left lung base consistent with atelectasis.   IMPRESSION: 1. No acute findings.  No evidence of bowel obstruction. 2. Surgical drain lies across the low abdomen.     Electronically Signed   By: Lajean Manes M.D.   On: 03/08/2022 14:08   Assessment:    s/p re-opening of laparotomy for repair of small bowel perforation and 16 days s/p laparotomy, excision of greater omental mass, abdominal wall reconstruction with [name],appendectomy, and placement of Prevena vac.  Resume clears to see if any issues.  Low BP reported overnight but asymptmatic, no fever, and area continues to drain well.  Fluid bolus and see if she improves.  No further workup from infectious standpoint unless fever spike.  Will continue to monitor  closely  labs/images/medications/previous chart entries reviewed personally and relevant changes/updates noted above.

## 2022-03-10 LAB — GLUCOSE, CAPILLARY
Glucose-Capillary: 125 mg/dL — ABNORMAL HIGH (ref 70–99)
Glucose-Capillary: 211 mg/dL — ABNORMAL HIGH (ref 70–99)
Glucose-Capillary: 151 mg/dL — ABNORMAL HIGH (ref 70–99)
Glucose-Capillary: 167 mg/dL — ABNORMAL HIGH (ref 70–99)

## 2022-03-10 LAB — CULTURE, BLOOD (ROUTINE X 2)
Culture: NO GROWTH
Culture: NO GROWTH
Special Requests: ADEQUATE
Special Requests: ADEQUATE

## 2022-03-10 LAB — PHOSPHORUS: Phosphorus: 2.6 mg/dL (ref 2.5–4.6)

## 2022-03-10 LAB — TRIGLYCERIDES: Triglycerides: 113 mg/dL (ref <150)

## 2022-03-10 LAB — MAGNESIUM: Magnesium: 2.5 mg/dL — ABNORMAL HIGH (ref 1.7–2.4)

## 2022-03-10 MED ORDER — SUCRALFATE 1 GM/10ML PO SUSP
1.0000 g | Freq: Four times a day (QID) | ORAL | Status: DC
  Administered 2022-03-10 – 2022-09-15 (×710): 1 g via ORAL
  Filled 2022-03-10 (×712): qty 10

## 2022-03-10 MED ORDER — NEPRO/CARBSTEADY PO LIQD
237.0000 mL | Freq: Three times a day (TID) | ORAL | Status: DC
  Administered 2022-03-11 – 2022-03-17 (×14): 237 mL via ORAL

## 2022-03-10 MED ORDER — TRACE MINERALS CU-MN-SE-ZN 300-55-60-3000 MCG/ML IV SOLN
INTRAVENOUS | Status: AC
  Filled 2022-03-10: qty 707.2

## 2022-03-10 NOTE — Progress Notes (Signed)
ID  zosyn 6/10-6/16 Fluconazole 02/19/22-03/03/22 Meropenem 6/16-6/23/23 Zosyn 03/04/22  Says she walked in the room' no fever Taking fluids   O/eawake, alert, no tachypnea Patient Vitals for the past 24 hrs:  BP Temp Temp src Pulse Resp SpO2  03/10/22 0808 (!) 83/52 -- -- 81 -- 93 %  03/10/22 0807 (!) 83/62 98.8 F (37.1 C) -- -- -- --  03/10/22 0536 (!) 89/54 98.8 F (37.1 C) -- 81 18 94 %  03/09/22 2024 (!) 96/54 98.5 F (36.9 C) -- 84 18 99 %  03/09/22 1729 (!) 99/55 98.6 F (37 C) Oral 82 -- 97 %  03/09/22 1316 (!) 91/50 98.2 F (36.8 C) Oral 81 16 97 %   Chest b/l air entry Decreased bases Hss1s2- PORT  Abd - large abdominal wall open wound with mesh exposed and now covered with wound manager   High volume brownish fluid in the canister Labs    Latest Ref Rng & Units 03/08/2022    2:35 PM 03/07/2022    5:44 AM 03/03/2022    6:44 AM  CBC  WBC 4.0 - 10.5 K/uL 5.5  7.6  7.7   Hemoglobin 12.0 - 15.0 g/dL 9.8  8.1  8.3   Hematocrit 36.0 - 46.0 % 31.2  24.8  26.0   Platelets 150 - 400 K/uL 233  189  209        Latest Ref Rng & Units 03/09/2022    3:50 AM 03/08/2022    2:35 PM 03/06/2022    5:51 AM  CMP  Glucose 70 - 99 mg/dL 274  157  182   BUN 6 - 20 mg/dL 40  29  30   Creatinine 0.44 - 1.00 mg/dL 0.90  0.76  0.61   Sodium 135 - 145 mmol/L 130  131  135   Potassium 3.5 - 5.1 mmol/L 4.5  4.7  4.7   Chloride 98 - 111 mmol/L 101  101  105   CO2 22 - 32 mmol/L '22  21  25   '$ Calcium 8.9 - 10.3 mg/dL 7.2  8.0  8.0   Total Protein 6.5 - 8.1 g/dL  6.9  6.5   Total Bilirubin 0.3 - 1.2 mg/dL  1.0  0.4   Alkaline Phos 38 - 126 U/L  132  101   AST 15 - 41 U/L  32  25   ALT 0 - 44 U/L  22  23      Micro Blood culture 6/13- NG 6/15- JP drain fluid candida  Impression/recommendation Abdominal wall wound dehiscence Intraabdominal abscess secondary to bowel perforation Fecal spillage with peritonitis Enterocutaneous fistula Pt completed Iv antibiotic meropenem on  02/28/22-also completed 2 weeks of fluconazole for candidal albicans intra abdominal infection  fever intermittently since then Restarted zosyn on 03/04/22- no fever now  blood culture neg so far- will continue zosyn for 2 weeks until 03/17/22 Has PORT- but currently does not look like the port is the source of fever  CT abdomen/pelvis shows no intraabdominal abscess. But large abdominal wall wound  H/o colon ca s/p left hemicolectomy May 2021  Recent omental mass resection 02/13/22- mets  Discussed the management with the patient and Surgery team ID will follow peripherally tomorrow- call if needed

## 2022-03-10 NOTE — Progress Notes (Signed)
Nutrition Follow-up  DOCUMENTATION CODES:   Obesity unspecified  INTERVENTION:   Continue TPN per pharmacy- provides 1872kcal/day and 106g/day protein   Nepro Shake po TID, each supplement provides 425 kcal and 19 grams protein  Daily weights   NUTRITION DIAGNOSIS:   Increased nutrient needs related to wound healing, catabolic illness as evidenced by estimated needs.  GOAL:   Patient will meet greater than or equal to 90% of their needs -met with TPN   MONITOR:   PO intake, Supplement acceptance, Labs, Weight trends, Diet advancement, I & O's. TPN  ASSESSMENT:   56 y/o female with h/o hypothyroidism, COVID 19 (3/21), kidney stones and stage 3 colon cancer (s/p left hemicolectomy 5/21 and chemotherapy) who is admitted for new pelvic mass now s/p laparotomy 6/8 (with excision of pelvic mass from greater omentum, abdominal wall reconstruction with bilateral myocutaneous flaps and mesh, incisional hernia repair, appendectomy repair and VAC placement) complicated by bowel perforation s/p reopening of recent laparotomy 6/10 (with repair of small bowel perforation, excision of mesh, placement of two phasix mesh and VAC placement). Pathology returned as metastatic adenocarcinoma.   Pt tolerating TPN at goal rate. Refeed labs stable. Triglycerides wnl. Pt with continued hyperglycemia; insulin in TPN. Per chart, pt appears weight stable since admission. Pt advanced to a full liquid diet today. Pt only eating sips/bites of her clear liquid diet. RD will add supplements to help pt meet her estimated needs. [name] pouch with 564m output.    Medications reviewed and include: lovenox, insulin, L-glutamine, synthroid, imodium, MVI, octreotide, protonix, carafate, NaCl '@75ml' /hr, zosyn  Labs reviewed: Na 130(L), K 4.5 wnl, BUN 40(H), P 2.6 wnl, Mg 2.5(H) Hgb 9.8(L), Hct 31.2(L) Cbgs- 211, 125 x 24 hrs  Diet Order:   Diet Order             Diet full liquid Room service appropriate? Yes; Fluid  consistency: Thin  Diet effective now                  EDUCATION NEEDS:   Not appropriate for education at this time  Skin:  Skin Assessment: Skin Integrity Issues: Skin Integrity Issues:: Incisions Incisions: closed abdomen  Last BM:  7/3  Height:   Ht Readings from Last 1 Encounters:  03/01/22 '4\' 11"'  (1.499 m)    Weight:   Wt Readings from Last 1 Encounters:  03/06/22 91 kg    Ideal Body Weight:  44.3 kg  BMI:  Body mass index is 40.52 kg/m.  Estimated Nutritional Needs:   Kcal:  1800-2100kcal/day  Protein:  90-110g/d  Fluid:  1.4-1.6L/day  CKoleen DistanceMS, RD, LDN Please refer to [facility]for RD and/or RD on-call/weekend/after hours pager

## 2022-03-10 NOTE — Progress Notes (Signed)
PHARMACY - TOTAL PARENTERAL NUTRITION CONSULT NOTE   Indication: Prolonged ileus  Patient Measurements: Height: '4\' 11"'$  (149.9 cm) Weight: 91 kg (200 lb 9.9 oz) IBW/kg (Calculated) : 43.2 TPN AdjBW (KG): 55.2 Body mass index is 40.52 kg/m.  Assessment:  56 y.o. female  s/p laparotomy, excision of greater omental mass, abdominal wall reconstruction with [name],appendectomy, and placement of Prevena vac.  Glucose / Insulin: no insulin requirements prior to TPN BG 125-195 trending down, TPN was held for unknown reason overnight --insulin requirements previous 24h: 11 units novolog SSI, 10 units of glargine, and 30 units of regular insulin in TPN bag  -*(TPN bag was discarded on floor the on 6/30 and had to hang D10W until new TPN on 7/1) *SSI ordered 3 times daily w/ meals, CBG ordered q6h   Electrolytes: WNL, Na 130   corrected Ca 8.6 Renal: Scr<1 Hepatic: LFTs wnl Intake / Output; MIVF:  GI Imaging: GI Surgeries / Procedures: s/p laparotomy, excision of greater omental mass, abdominal wall reconstruction with [name],appendectomy, and placement of Prevena vac  Central access: 02/15/22 TPN start date: 02/15/22  Nutritional Goals: Goal TPN rate was 75 mL/hr, but now decreased to 68m/hr as patient looked to be fluid overloaded(+11.5L) (provides 106 g of protein and 1872 kcals per day)  RD Assessment:  Estimated Needs Total Energy Estimated Needs: 1800-2100kcal/day Total Protein Estimated Needs: 90-110g/d Total Fluid Estimated Needs: 1.4-1.6L/day  Current Nutrition: resuming CLD 7/2  Plan:  Continue TPN at 628mhr(new goal rate) Nutritional Components Amino acids (using 15% Clinisol): 106 grams Dextrose: 265.2 grams Lipids (using 20% SMOFlipids): 54.6 grams kCal: 1872/24h  Electrolytes in TPN (standard): Na 5049mL, K 29m69m, Ca 5mEq22m Mg 5mEq/13mand Phos 25 mmol/L. Cl:Ac 1:1 Add trace elements to TPN, MVI changed to PO  *TPN not hung 6/30  evening-(discarded by floor RN) so D10W hung in place till new TPN bag available on 7/1* BG trending down (TPN was held for a period overnight)-Will continue insulin 30 units in TPN, may need to go back up if BG increases SSI three times daily with meals (CBG q6h)- May need to adjust SSI to q6h? Monitor TPN labs on Mon/Thurs  Gretel Cantu KPaulina FusimD, BCPS 03/10/2022 10:42 AM

## 2022-03-10 NOTE — Progress Notes (Signed)
Occupational Therapy Treatment Patient Details Name: Debra [name] MRN: [id number] DOB: 1966/03/13 Today's Date: 03/10/2022   History of present illness Pt is a 56 y.o. female with PMH including hypothyroidism, iron deficient anemia, colon cancer.  Here s/p mass removal with hernia repair and appendectomy 6/8, bowel perforation repair 6/10.   OT comments  Upon entering the room, pt supine in bed with family present in the room. Use of stratus interpreter during session. Pt is agreeable to OT intervention. Pt performs supine >sit with min A for trunk support and increased time. Pt stands with supervision and use of RW. Pt needing assistance to manage lines/leads and reports urgency for toileting with ambulation of several feet to [facility]. Pt performing her own hygiene with set up A to obtain needed items and min guard for balance. Pt then returns to bed in same manner secondary to fatigue. Sit >supine with min A for B LEs when returning to bed. All needs within reach and family remains in room.    Recommendations for follow up therapy are one component of a multi-disciplinary discharge planning process, led by the attending physician.  Recommendations may be updated based on patient status, additional functional criteria and insurance authorization.    Follow Up Recommendations  Skilled nursing-short term rehab (<3 hours/day)    Assistance Recommended at Discharge Frequent or constant Supervision/Assistance  Patient can return home with the following  A lot of help with walking and/or transfers;A lot of help with bathing/dressing/bathroom;Assistance with cooking/housework;Assist for transportation   Equipment Recommendations  BSC/3in1       Precautions / Restrictions Precautions Precautions: Fall Precaution Comments: multiple drains Restrictions Weight Bearing Restrictions: No Other Position/Activity Restrictions: Abdominal surgery       Mobility Bed Mobility Overal bed mobility:  Needs Assistance Bed Mobility: Sit to Supine, Supine to Sit     Supine to sit: Min guard, HOB elevated Sit to supine: Min assist, HOB elevated        Transfers Overall transfer level: Needs assistance Equipment used: Rolling walker (2 wheels) Transfers: Sit to/from Stand, Bed to chair/wheelchair/BSC Sit to Stand: Min assist     Step pivot transfers: Min guard     General transfer comment: cues for sequencing and safety. assistance for managing lines and leads     Balance Overall balance assessment: Needs assistance Sitting-balance support: Feet supported Sitting balance-Leahy Scale: Good     Standing balance support: Reliant on assistive device for balance, During functional activity Standing balance-Leahy Scale: Fair Standing balance comment: requires use of AD with ambulation                           ADL either performed or assessed with clinical judgement   ADL Overall ADL's : Needs assistance/impaired     Grooming: Wash/dry hands;Standing;Supervision/safety                   Toilet Transfer: Min guard;BSC/3in1   Toileting- Water quality scientist and Hygiene: Min guard;Sit to/from stand       Functional mobility during ADLs: Min guard      Extremity/Trunk Assessment Upper Extremity Assessment Upper Extremity Assessment: Overall WFL for tasks assessed;Generalized weakness   Lower Extremity Assessment Lower Extremity Assessment: Overall WFL for tasks assessed;Generalized weakness        Vision Patient Visual Report: No change from baseline            Cognition Arousal/Alertness: Awake/alert Behavior During Therapy: WFL for tasks assessed/performed,  Flat affect Overall Cognitive Status: Within Functional Limits for tasks assessed                                                     Pertinent Vitals/ Pain       Pain Assessment Pain Assessment: No/denies pain         Frequency  Min 2X/week         Progress Toward Goals  OT Goals(current goals can now be found in the care plan section)  Progress towards OT goals: Progressing toward goals  Acute Rehab OT Goals Patient Stated Goal: to get stronger OT Goal Formulation: With patient/family Time For Goal Achievement: 03/13/22 Potential to Achieve Goals: Good  Plan Discharge plan remains appropriate;Frequency remains appropriate       AM-PAC OT "6 Clicks" Daily Activity     Outcome Measure   Help from another person eating meals?: None Help from another person taking care of personal grooming?: A Little Help from another person toileting, which includes using toliet, bedpan, or urinal?: A Little Help from another person bathing (including washing, rinsing, drying)?: A Lot Help from another person to put on and taking off regular upper body clothing?: A Little Help from another person to put on and taking off regular lower body clothing?: A Lot 6 Click Score: 17    End of Session Equipment Utilized During Treatment: Rolling walker (2 wheels)  OT Visit Diagnosis: Unsteadiness on feet (R26.81)   Activity Tolerance Patient limited by fatigue   Patient Left in bed;with call bell/phone within reach;with bed alarm set;with family/visitor present   Nurse Communication Mobility status        Time: 7342-8768 OT Time Calculation (min): 25 min  Charges: OT General Charges $OT Visit: 1 Visit OT Treatments $Self Care/Home Management : 23-37 mins  Darleen Crocker, MS, OTR/L , CBIS ascom [phone]  03/10/22, 2:46 PM

## 2022-03-10 NOTE — Progress Notes (Signed)
03/10/2022  Subjective: Patient is 23 Days Post-Op.  No acute events.  Her TPN for some reason was paused overnight.  Morning RN was not sure why but it's getting restarted this morning.  Her BP had been somewhat on softer side, but she denies any symptoms.  Denies any worsening pain.  Vital signs: Temp:  [98.2 F (36.8 C)-98.8 F (37.1 C)] 98.8 F (37.1 C) (07/03 0807) Pulse Rate:  [81-84] 81 (07/03 0808) Resp:  [16-18] 18 (07/03 0536) BP: (83-99)/(50-62) 83/52 (07/03 0808) SpO2:  [93 %-99 %] 93 % (07/03 5638)   Intake/Output: 07/02 0701 - 07/03 0700 In: 1851.4 [I.V.:1603.3; IV Piggyback:248.1] Out: 515 [Drains:515] Last BM Date : 03/05/22  Physical Exam: Constitutional:  No acute distress Abdomen:  soft, non-distended, non-tender.  [name] pouch in place with red rubber catheter going towards base of wound, connected to suction.  No gross leak.  Has intra-abdominal drain with thick serous fluid.    Labs:  Recent Labs    03/08/22 1435  WBC 5.5  HGB 9.8*  HCT 31.2*  PLT 233   Recent Labs    03/08/22 1435 03/09/22 0350  NA 131* 130*  K 4.7 4.5  CL 101 101  CO2 21* 22  GLUCOSE 157* 274*  BUN 29* 40*  CREATININE 0.76 0.90  CALCIUM 8.0* 7.2*   No results for input(s): "LABPROT", "INR" in the last 72 hours.  Imaging: No results found.  Assessment/Plan: This is a 56 y.o. female with high output enterocutaneous fistula 20 Days Post-Op s/p re-opening of laparotomy for repair of small bowel perforation and 14 days s/p laparotomy, excision of greater omental mass, abdominal wall reconstruction with [name],appendectomy, and placement of Prevena vac.  --Patient denies any nausea and tolerated clear liquids yesterday.  Will advance again to full liquids.  Continue TPN for nutritional support given her fistula. --Continue OOB, ambulation --Will monitor BP and may need another bolus if any lower. --Patient remains afebrile with temps < 100 since 6/28.  On IV  Zosyn per ID recs.  Appreciate their assistance. --[name] pouch due to change towards the end of this week or early next week.   Melvyn Neth, [facility]

## 2022-03-11 LAB — BASIC METABOLIC PANEL
Anion gap: 7 (ref 5–15)
BUN: 20 mg/dL (ref 6–20)
CO2: 21 mmol/L — ABNORMAL LOW (ref 22–32)
Calcium: 7.5 mg/dL — ABNORMAL LOW (ref 8.9–10.3)
Chloride: 108 mmol/L (ref 98–111)
Creatinine, Ser: 0.57 mg/dL (ref 0.44–1.00)
GFR, Estimated: >60 mL/min (ref >60)
Glucose, Bld: 172 mg/dL — ABNORMAL HIGH (ref 70–99)
Potassium: 3.7 mmol/L (ref 3.5–5.1)
Sodium: 136 mmol/L (ref 135–145)

## 2022-03-11 LAB — CBC
HCT: 24 % — ABNORMAL LOW (ref 36.0–46.0)
Hemoglobin: 7.5 g/dL — ABNORMAL LOW (ref 12.0–15.0)
MCH: 27.7 pg (ref 26.0–34.0)
MCHC: 31.3 g/dL (ref 30.0–36.0)
MCV: 88.6 fL (ref 80.0–100.0)
Platelets: 184 10*3/uL (ref 150–400)
RBC: 2.71 MIL/uL — ABNORMAL LOW (ref 3.87–5.11)
RDW: 15 % (ref 11.5–15.5)
WBC: 5.7 10*3/uL (ref 4.0–10.5)
nRBC: 0.7 % — ABNORMAL HIGH (ref 0.0–0.2)

## 2022-03-11 LAB — GLUCOSE, CAPILLARY
Glucose-Capillary: 173 mg/dL — ABNORMAL HIGH (ref 70–99)
Glucose-Capillary: 186 mg/dL — ABNORMAL HIGH (ref 70–99)
Glucose-Capillary: 108 mg/dL — ABNORMAL HIGH (ref 70–99)

## 2022-03-11 LAB — PREPARE RBC (CROSSMATCH)

## 2022-03-11 MED ORDER — SODIUM CHLORIDE 0.9% IV SOLUTION: Freq: Once | INTRAVENOUS | Status: AC

## 2022-03-11 MED ORDER — TRACE MINERALS CU-MN-SE-ZN 300-55-60-3000 MCG/ML IV SOLN
INTRAVENOUS | Status: DC
  Filled 2022-03-11: qty 707.2

## 2022-03-11 MED ORDER — TRACE MINERALS CU-MN-SE-ZN 300-55-60-3000 MCG/ML IV SOLN
INTRAVENOUS | Status: AC
  Filled 2022-03-11: qty 707.2

## 2022-03-11 NOTE — Progress Notes (Signed)
End of shift note:  Pt received 1 unit of PRBC. Blood was started at 1704. A new ultrasound guided IV was started on the left upper arm. BG and VS were checked throughout the day. IVF, IV abx and TPN given.

## 2022-03-11 NOTE — Progress Notes (Signed)
03/11/2022  Subjective: Patient is 24 Days Post-Op.  No acute events overnight.  Her labs this morning show a Hgb of 7.5, decreased from 9.8 on 7/1.  Her BP continues to be on softer side, though the patient reports no significant dizziness and reports fatigue at her baseline.  She has ambulated in the room.  No nausea or emesis and is tolerating full liquids again.  Vital signs: Temp:  [98.1 F (36.7 C)-98.7 F (37.1 C)] 98.2 F (36.8 C) (07/04 0757) Pulse Rate:  [75-87] 75 (07/04 0757) Resp:  [16-19] 16 (07/04 0757) BP: (87-95)/(53-59) 95/53 (07/04 0757) SpO2:  [92 %-99 %] 97 % (07/04 0453)   Intake/Output: 07/03 0701 - 07/04 0700 In: 1466.6 [I.V.:1466.6] Out: -  Last BM Date : 03/10/22  Physical Exam: Constitutional: No acute distress Abdomen:  soft, non-distended, non-tender.  Midline wound stable with [name] pouch with suction, with bilious/succus type fluid in the wound base and going to the suction canister.  Drain stable with dark serous fluid.  Labs:  Recent Labs    03/08/22 1435 03/11/22 0455  WBC 5.5 5.7  HGB 9.8* 7.5*  HCT 31.2* 24.0*  PLT 233 184   Recent Labs    03/09/22 0350 03/11/22 0455  NA 130* 136  K 4.5 3.7  CL 101 108  CO2 22 21*  GLUCOSE 274* 172*  BUN 40* 20  CREATININE 0.90 0.57  CALCIUM 7.2* 7.5*   No results for input(s): "LABPROT", "INR" in the last 72 hours.  Imaging: No results found.  Assessment/Plan: This is a 56 y.o. female with high output enterocutaneous fistula 20 Days Post-Op s/p re-opening of laparotomy for repair of small bowel perforation and 14 days s/p laparotomy, excision of greater omental mass, abdominal wall reconstruction with [name],appendectomy, and placement of Prevena vac  --Given the patient's decreasing Hgb, this could certainly explain her softer BP recently.  Will order repeat type and screen and also transfusion of 1 unit pRBC.  Will re-evaluate tomorrow to see response and if she needs another  unit. --Continue full liquid diet as tolerated, and continue IV TPN for further nutrition. --Continue IV Zosyn per ID team recs.  Appreciate Dr. Gwenevere Ghazi assistance. --OOB, ambulate as tolerated.  [name] pouch can be temporarily capped for ambulation.   Melvyn Neth, [facility]

## 2022-03-11 NOTE — Consult Note (Signed)
Pharmacy Antibiotic Note  Debra [name] is a 56 y.o. female with PMH including colon cancer admitted on 02/13/2022 for excision of pelvic mass from greater omentum, abdominal wall reconstruction, recurrent incisional hernia repair, and appendectomy. Patient returned to OR 6/10 for reopening of laparotomy for repair of small bowel perforation. ID has been following for intraabdominal abscesses secondary to bowel perforation. Pharmacy has been consulted for Zosyn dosing.  -ID following  Plan: Zosyn 3.375g IV q8h (4 hour infusion).    Height: '4\' 11"'$  (149.9 cm) Weight: 91 kg (200 lb 9.9 oz) IBW/kg (Calculated) : 43.2  Temp (24hrs), Avg:98.3 F (36.8 C), Min:98.1 F (36.7 C), Max:98.7 F (37.1 C)  Recent Labs  Lab 03/06/22 0551 03/07/22 0544 03/08/22 1435 03/09/22 0350 03/11/22 0455  WBC  --  7.6 5.5  --  5.7  CREATININE 0.61  --  0.76 0.90 0.57     Estimated Creatinine Clearance: 75.4 mL/min (by C-G formula based on SCr of 0.57 mg/dL).    No Known Allergies  Antimicrobials this admission: Cefotetan 6/8 >> 6/9 (surgical prophylaxis) Zosyn 6/10 >> 6/16,  restart 6/27 >> Vancomycin 6/14 >> 6/18 Fluconazole 6/14 >> 6/27 Meropenem 6/16 >> 6/22  Dose adjustments this admission: N/A  Microbiology results: 6/12 BCx: NG 6/28 Bcx NG 6/13 BCx: NG 6/15 LLQ drain: Candida albicans  Thank you for allowing pharmacy to be a part of this patient's care.  Seaton Hofmann A 03/11/2022 10:33 AM

## 2022-03-11 NOTE — Progress Notes (Signed)
PHARMACY - TOTAL PARENTERAL NUTRITION CONSULT NOTE   Indication: Prolonged ileus  Patient Measurements: Height: '4\' 11"'$  (149.9 cm) Weight: 91 kg (200 lb 9.9 oz) IBW/kg (Calculated) : 43.2 TPN AdjBW (KG): 55.2 Body mass index is 40.52 kg/m.  Assessment:  56 y.o. female  s/p laparotomy, excision of greater omental mass, abdominal wall reconstruction with [name],appendectomy, and placement of Prevena vac.  Glucose / Insulin: no insulin requirements prior to TPN BG 125-211 --insulin requirements previous 24h: 10 units novolog SSI (mod TIDw/meals) and 30 units of regular insulin in TPN bag   *SSI ordered 3 times daily w/ meals, CBG ordered q6h   Electrolytes: WNL, Na 130   corrected Ca 8.6 Renal: Scr<1 Hepatic: LFTs wnl Intake / Output; MIVF:  GI Imaging: GI Surgeries / Procedures: s/p laparotomy, excision of greater omental mass, abdominal wall reconstruction with [name],appendectomy, and placement of Prevena vac  Central access: 02/15/22 TPN start date: 02/15/22  Nutritional Goals: Goal TPN rate was 75 mL/hr, but now decreased to 68m/hr as patient looked to be fluid overloaded(+11.5L) (provides 106 g of protein and 1872 kcals per day)  RD Assessment:  Estimated Needs Total Energy Estimated Needs: 1800-2100kcal/day Total Protein Estimated Needs: 90-110g/d Total Fluid Estimated Needs: 1.4-1.6L/day  Current Nutrition: resuming CLD 7/2  Plan:  Continue TPN at 659mhr(new goal rate) Nutritional Components Amino acids (using 15% Clinisol): 106 grams Dextrose: 265.2 grams Lipids (using 20% SMOFlipids): 54.6 grams kCal: 1872/24h  Electrolytes in TPN (standard): Na 5056mL, K 32m70m, Ca 5mEq43m Mg 5mEq/71mand Phos 25 mmol/L. Cl:Ac 1:1 Add trace elements to TPN, MVI changed to PO  Will continue insulin 30 units in TPN, may need to increase if BG increases, (last dose of Glargine was 7/2 am) SSI three times daily with meals (CBG q6h) Will order  labs for 7/5 to assess (K,phos seem to trend down and Mag trend up?) Monitor TPN labs on Mon/Thurs  KristiChinita GreenlandD Clinical Pharmacist 03/11/2022

## 2022-03-12 DIAGNOSIS — T8130XA Disruption of wound, unspecified, initial encounter: Secondary | ICD-10-CM

## 2022-03-12 LAB — TYPE AND SCREEN
ABO/RH(D): O POS
Antibody Screen: NEGATIVE
Unit division: 0

## 2022-03-12 LAB — CBC
HCT: 28.4 % — ABNORMAL LOW (ref 36.0–46.0)
Hemoglobin: 8.9 g/dL — ABNORMAL LOW (ref 12.0–15.0)
MCH: 26.7 pg (ref 26.0–34.0)
MCHC: 31.3 g/dL (ref 30.0–36.0)
MCV: 85.3 fL (ref 80.0–100.0)
Platelets: 166 10*3/uL (ref 150–400)
RBC: 3.33 MIL/uL — ABNORMAL LOW (ref 3.87–5.11)
RDW: 16.2 % — ABNORMAL HIGH (ref 11.5–15.5)
WBC: 5.5 10*3/uL (ref 4.0–10.5)
nRBC: 0.6 % — ABNORMAL HIGH (ref 0.0–0.2)

## 2022-03-12 LAB — MAGNESIUM: Magnesium: 2.2 mg/dL (ref 1.7–2.4)

## 2022-03-12 LAB — PHOSPHORUS: Phosphorus: 2.5 mg/dL (ref 2.5–4.6)

## 2022-03-12 LAB — GLUCOSE, CAPILLARY
Glucose-Capillary: 163 mg/dL — ABNORMAL HIGH (ref 70–99)
Glucose-Capillary: 160 mg/dL — ABNORMAL HIGH (ref 70–99)
Glucose-Capillary: 130 mg/dL — ABNORMAL HIGH (ref 70–99)
Glucose-Capillary: 146 mg/dL — ABNORMAL HIGH (ref 70–99)
Glucose-Capillary: 127 mg/dL — ABNORMAL HIGH (ref 70–99)

## 2022-03-12 LAB — BASIC METABOLIC PANEL
Anion gap: 5 (ref 5–15)
BUN: 17 mg/dL (ref 6–20)
CO2: 22 mmol/L (ref 22–32)
Calcium: 7.6 mg/dL — ABNORMAL LOW (ref 8.9–10.3)
Chloride: 108 mmol/L (ref 98–111)
Creatinine, Ser: 0.48 mg/dL (ref 0.44–1.00)
GFR, Estimated: >60 mL/min (ref >60)
Glucose, Bld: 162 mg/dL — ABNORMAL HIGH (ref 70–99)
Potassium: 3.8 mmol/L (ref 3.5–5.1)
Sodium: 135 mmol/L (ref 135–145)

## 2022-03-12 LAB — BPAM RBC
Blood Product Expiration Date: 202308052359
ISSUE DATE / TIME: 202307041648
Unit Type and Rh: 5100

## 2022-03-12 MED ORDER — TRACE MINERALS CU-MN-SE-ZN 300-55-60-3000 MCG/ML IV SOLN
INTRAVENOUS | Status: AC
  Filled 2022-03-12: qty 707.2

## 2022-03-12 NOTE — Progress Notes (Signed)
ID  zosyn 6/10-6/16 Fluconazole 02/19/22-03/03/22 Meropenem 6/16-6/23/23 Zosyn 03/04/22  Feeling okay Sat in chair Walked inside the room   O/eawake, alert, no tachypnea Patient Vitals for the past 24 hrs:  BP Temp Temp src Pulse Resp SpO2  03/12/22 0905 96/61 98.4 F (36.9 C) Oral 73 17 97 %  03/12/22 0512 (!) 94/56 97.9 F (36.6 C) Oral 71 18 97 %  03/11/22 2008 (!) 98/55 98.5 F (36.9 C) Oral 78 16 --  03/11/22 2000 -- 98.6 F (37 C) Oral -- -- --  03/11/22 1719 (!) 91/57 98.9 F (37.2 C) Oral 78 18 --  03/11/22 1644 (!) 93/53 98.9 F (37.2 C) -- 81 16 100 %   Chest b/l air entry  Hss1s2- PORT  Abd - large abdominal wall open wound with mesh exposed and now covered with wound manager   High volume brownish fluid in the canister Labs    Latest Ref Rng & Units 03/12/2022   10:54 AM 03/11/2022    4:55 AM 03/08/2022    2:35 PM  CBC  WBC 4.0 - 10.5 K/uL 5.5  5.7  5.5   Hemoglobin 12.0 - 15.0 g/dL 8.9  7.5  9.8   Hematocrit 36.0 - 46.0 % 28.4  24.0  31.2   Platelets 150 - 400 K/uL 166  184  233        Latest Ref Rng & Units 03/12/2022   10:54 AM 03/11/2022    4:55 AM 03/09/2022    3:50 AM  CMP  Glucose 70 - 99 mg/dL 162  172  274   BUN 6 - 20 mg/dL 17  20  40   Creatinine 0.44 - 1.00 mg/dL 0.48  0.57  0.90   Sodium 135 - 145 mmol/L 135  136  130   Potassium 3.5 - 5.1 mmol/L 3.8  3.7  4.5   Chloride 98 - 111 mmol/L 108  108  101   CO2 22 - 32 mmol/L '22  21  22   '$ Calcium 8.9 - 10.3 mg/dL 7.6  7.5  7.2      Micro Blood culture 6/13- NG 6/15- JP drain fluid candida  Impression/recommendation Abdominal wall wound dehiscence Intraabdominal abscess secondary to bowel perforation Fecal spillage with peritonitis Enterocutaneous fistula Pt completed Iv antibiotic meropenem on 02/28/22-also completed 2 weeks of fluconazole for candidal albicans intra abdominal infection  fever intermittently since then Restarted zosyn on 03/04/22- no fever now  blood culture neg so far-  will continue zosyn for 2 weeks until 03/17/22 Has PORT- but currently does not look like the port is the source of fever  CT abdomen/pelvis shows no intraabdominal abscess. But large abdominal wall wound  H/o colon ca s/p left hemicolectomy May 2021  Recent omental mass resection 02/13/22- mets  Discussed the management with the patient , her granddaughter and Surgery team ID will sign off- call if needed

## 2022-03-12 NOTE — Progress Notes (Signed)
Physical Therapy Treatment Patient Details Name: Debra [name] MRN: [id number] DOB: 1966-08-03 Today's Date: 03/12/2022   History of Present Illness Pt is a 56 y.o. female with PMH including hypothyroidism, iron deficient anemia, colon cancer.  Here s/p mass removal with hernia repair and appendectomy 6/8, bowel perforation repair 6/10.    PT Comments    Patient alert, agreeable to PT, interpretor used throughout session. Pt reported "not much" pain throughout session. She was able to do a few supine exercises with verbal cues. Rolling with minA, and sidelying to sit with minA, via handheld assist. Pt very cautious, extended time needed. The pt did endorse some lightheadedness, but still agreeable to transfer to recliner via step pivot and CGA, no AD. Repositioned for comfort and further ambulation deferred due to symptoms. The patient would benefit from further skilled PT intervention to continue to progress towards goals. Recommendation remains appropriate.    Recommendations for follow up therapy are one component of a multi-disciplinary discharge planning process, led by the attending physician.  Recommendations may be updated based on patient status, additional functional criteria and insurance authorization.  Follow Up Recommendations  Skilled nursing-short term rehab (<3 hours/day) Can patient physically be transported by private vehicle: Yes   Assistance Recommended at Discharge Frequent or constant Supervision/Assistance  Patient can return home with the following Help with stairs or ramp for entrance;Assist for transportation;Assistance with cooking/housework;A lot of help with bathing/dressing/bathroom;A little help with walking and/or transfers   Equipment Recommendations  Rolling walker (2 wheels);Hospital bed;Wheelchair cushion (measurements PT);Wheelchair (measurements PT);BSC/3in1    Recommendations for Other Services       Precautions / Restrictions  Precautions Precautions: Fall Precaution Comments: multiple drains Restrictions Weight Bearing Restrictions: No Other Position/Activity Restrictions: Abdominal surgery     Mobility  Bed Mobility Overal bed mobility: Needs Assistance Bed Mobility: Supine to Sit Rolling: Min guard Sidelying to sit: Min assist, HOB elevated       General bed mobility comments: use of bed rails, extended time    Transfers Overall transfer level: Needs assistance Equipment used: None Transfers: Sit to/from Stand, Bed to chair/wheelchair/BSC Sit to Stand: Min guard   Step pivot transfers: Min guard       General transfer comment: very slow    Ambulation/Gait                   Stairs             Wheelchair Mobility    Modified Rankin (Stroke Patients Only)       Balance Overall balance assessment: Needs assistance Sitting-balance support: Feet supported Sitting balance-Leahy Scale: Good     Standing balance support: Reliant on assistive device for balance, During functional activity Standing balance-Leahy Scale: Fair                              Cognition Arousal/Alertness: Awake/alert Behavior During Therapy: WFL for tasks assessed/performed, Flat affect Overall Cognitive Status: Within Functional Limits for tasks assessed                                          Exercises General Exercises - Lower Extremity Ankle Circles/Pumps: AROM, Both, 10 reps Heel Slides: AAROM, Strengthening, Both, 10 reps, Supine    General Comments        Pertinent Vitals/Pain Pain Assessment Pain Assessment:  No/denies pain    Home Living                          Prior Function            PT Goals (current goals can now be found in the care plan section) Progress towards PT goals: Progressing toward goals    Frequency    Min 2X/week      PT Plan Current plan remains appropriate    Co-evaluation               AM-PAC PT "6 Clicks" Mobility   Outcome Measure  Help needed turning from your back to your side while in a flat bed without using bedrails?: A Little Help needed moving from lying on your back to sitting on the side of a flat bed without using bedrails?: A Little Help needed moving to and from a bed to a chair (including a wheelchair)?: A Little Help needed standing up from a chair using your arms (e.g., wheelchair or bedside chair)?: A Little Help needed to walk in hospital room?: A Lot Help needed climbing 3-5 steps with a railing? : A Lot 6 Click Score: 16    End of Session   Activity Tolerance: Patient tolerated treatment well;Other (comment) (limited by light headedness) Patient left: with call bell/phone within reach;with family/visitor present;in chair Nurse Communication: Mobility status PT Visit Diagnosis: Muscle weakness (generalized) (M62.81);Difficulty in walking, not elsewhere classified (R26.2)     Time: 5638-9373 PT Time Calculation (min) (ACUTE ONLY): 19 min  Charges:  $Therapeutic Activity: 8-22 mins                     Lieutenant Diego PT, DPT 10:57 AM,03/12/22

## 2022-03-12 NOTE — Progress Notes (Signed)
PHARMACY - TOTAL PARENTERAL NUTRITION CONSULT NOTE   Indication: Prolonged ileus  Patient Measurements: Height: '4\' 11"'$  (149.9 cm) Weight: 91 kg (200 lb 9.9 oz) IBW/kg (Calculated) : 43.2 TPN AdjBW (KG): 55.2 Body mass index is 40.52 kg/m.  Assessment:  56 y.o. female  s/p laparotomy, excision of greater omental mass, abdominal wall reconstruction with [name],appendectomy, and placement of Prevena vac.  Glucose / Insulin: no insulin requirements prior to TPN BG 108-186 --insulin requirements previous 24h: 6 units novolog SSI (mod TIDw/meals) and 30 units of regular insulin in TPN bag   *SSI ordered 3 times daily w/ meals, CBG ordered q6h   Electrolytes: WNL, Na 136   corrected Ca 8.6 Renal: Scr<1 Hepatic: LFTs wnl Intake / Output; MIVF:  GI Imaging: GI Surgeries / Procedures: s/p laparotomy, excision of greater omental mass, abdominal wall reconstruction with [name],appendectomy, and placement of Prevena vac  Central access: 02/15/22 TPN start date: 02/15/22  Nutritional Goals: Goal TPN rate was 75 mL/hr, but now decreased to 24m/hr as patient looked to be fluid overloaded(+11.5L) (provides 106 g of protein and 1872 kcals per day)  RD Assessment:  Estimated Needs Total Energy Estimated Needs: 1800-2100kcal/day Total Protein Estimated Needs: 90-110g/d Total Fluid Estimated Needs: 1.4-1.6L/day  Current Nutrition:  resuming CLD 7/2 7/5 continued FLD + nutritional supplementation  Plan 03/12/22:  Continue TPN at 643mhr(new goal rate) Nutritional Components Amino acids (using 15% Clinisol): 106 grams Dextrose: 265.2 grams Lipids (using 20% SMOFlipids): 54.6 grams kCal: 1872/24h  Electrolytes in TPN (standard): Na 5082mL, K 37m43m, Ca 5mEq47m Mg 5mEq/37mand Phos 25 mmol/L. Cl:Ac 1:1 Add trace elements to TPN, MVI changed to PO  Will continue insulin 30 units in TPN, may need to increase if BG increases, (last dose of Glargine was 7/2  am) SSI three times daily with meals (CBG q6h) Ordered labs for 7/5 to assess (K,phos seem to trend down and Mag trend up?) -- not collected by RN in time. Monitor TPN labs on Mon/Thurs  CarissDorothe PeamD, BCPS Clinical Pharmacist   03/12/2022

## 2022-03-12 NOTE — Consult Note (Signed)
[place] Nurse wound follow up [name] fistula pouch applied 5 days ago remains intact with good seal. Seal is enhanced by attachment to suction while in bed.  Suction is discontinued when patient is OOB ambulating.  Supplies at bedside for next change (2 [name] pouches and 1 red rubber catheter).  Plan to change every 7-10 days per Dr. Mont Dutton note on 7/3, at end of this week or early next week.  [place] nursing team will follow, and will remain available to this patient, the nursing and medical teams.     Maudie Flakes, MSN, RN, CNS, [place], Serita Grammes, [employer], [employer] phone:  [phone]

## 2022-03-12 NOTE — TOC Progression Note (Signed)
Transition of Care [facility]) - Progression Note    Patient Details  Name: Debra [name] MRN: [id number] Date of Birth: [birth year]  Transition of Care [facility]) CM/SW [place], RN Phone Number: 03/12/2022, 8:56 AM  Clinical Narrative:   TOC continues to follow for needs, Plan to go home with [facility] thru charity once medically stable.     Expected Discharge Plan: [place] Barriers to Discharge: Continued Medical Work up  Expected Discharge Plan and Services Expected Discharge Plan: [place]                                               Social Determinants of Health (SDOH) Interventions    Readmission Risk Interventions     No data to display

## 2022-03-12 NOTE — Progress Notes (Signed)
[place] Hospital Day(s): [place] op day(s): 25 Days Post-Op.   Interval History:  Patient seen and examined No fever recently --> Restarted on Zosyn 06/27 by ID; plan for 2 weeks No abdominal pain No new labs Surgical Drains:   - Right Drain (Intraperitoneal) 10 ccs [name] Pouch output not recorded in last 24 hours She is on FLD; on TPN Working with therapies; recommending SNF  Pathology reviewed:  -- Metastatic adenocarcinoma; colon origin -- Plan for output follow up with oncology (Dr Tasia Catchings)  Vital signs in last 24 hours: [min-max] current  Temp:  [97.9 F (36.6 C)-98.9 F (37.2 C)] 97.9 F (36.6 C) (07/05 0512) Pulse Rate:  [71-81] 71 (07/05 0512) Resp:  [16-18] 18 (07/05 0512) BP: (91-98)/(53-57) 94/56 (07/05 0512) SpO2:  [97 %-100 %] 97 % (07/05 0512)     Height: '4\' 11"'$  (149.9 cm) Weight: 91 kg BMI (Calculated): 40.5   Intake/Output last 2 shifts:  07/04 0701 - 07/05 0700 In: 952 [P.O.:600; Blood:352] Out: 500 [Blood:500]   Physical Exam:  Constitutional: alert, cooperative and no distress  Respiratory: breathing non-labored at rest  Cardiovascular: regular rate and sinus rhythm  Gastrointestinal: Soft, abdominal soreness on the right, non-distended, no rebound/guarding. Surgical drain in LLQ; serosanguinous, RLQ drain output slowing; bilious Integumentary: Again, there is fascial dehiscence with exposed mesh now with feculent/bilious drainage consistent with EC fistula. Subcutaneous tissues are starting to granulate. [name] Pouch in place with good seal, red rubber present to manage effluent   Labs:     Latest Ref Rng & Units 03/11/2022    4:55 AM 03/08/2022    2:35 PM 03/07/2022    5:44 AM  CBC  WBC 4.0 - 10.5 K/uL 5.7  5.5  7.6   Hemoglobin 12.0 - 15.0 g/dL 7.5  9.8  8.1   Hematocrit 36.0 - 46.0 % 24.0  31.2  24.8   Platelets 150 - 400 K/uL 184  233  189       Latest Ref Rng & Units 03/11/2022    4:55 AM 03/09/2022     3:50 AM 03/08/2022    2:35 PM  CMP  Glucose 70 - 99 mg/dL 172  274  157   BUN 6 - 20 mg/dL 20  40  29   Creatinine 0.44 - 1.00 mg/dL 0.57  0.90  0.76   Sodium 135 - 145 mmol/L 136  130  131   Potassium 3.5 - 5.1 mmol/L 3.7  4.5  4.7   Chloride 98 - 111 mmol/L 108  101  101   CO2 22 - 32 mmol/L '21  22  21   '$ Calcium 8.9 - 10.3 mg/dL 7.5  7.2  8.0   Total Protein 6.5 - 8.1 g/dL   6.9   Total Bilirubin 0.3 - 1.2 mg/dL   1.0   Alkaline Phos 38 - 126 U/L   132   AST 15 - 41 U/L   32   ALT 0 - 44 U/L   22      Imaging studies: No new pertinent imaging studies    Assessment/Plan: 56 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   - Wound Care: [name] pouch in place with red rubber present to manage drainage. Anticipate fluid will  pool in the wound bed while she is in bed. This will be a mix of serosanguinous fluid and bilious fluid. It is okay to adjust the red rubber catheter from time to time to help with drainage while in bed. This drains well while she is ambulatory and sitting up given dependent location of the drain. These pouches need changed every 7-10 days (ideally).   - Continue FLD as tolerated + nutritional supplementation - Continue TPN at goal; monitor electrolytes - Continue octreotide; 200 mcg/min TID, continue imodium   - ID on board; restarted on Zosyn 06/27; plan for 2 weeks  - Continue surgical drains; monitor and record output  - Monitor abdominal examination; on-going bowel function  - Pain control prn; antemetic prn  - Out of bed; therapies on board; recommendations are HHPT   - Discharge Planning: Anticipate lengthy admission   All of the above findings and recommendations were discussed with the patient, and the  medical team, and all of patient's questions were answered to her expressed satisfaction.  -- [name], PA-C [facility] 03/12/2022, 9:01 AM M-F: 7am - 4pm

## 2022-03-12 NOTE — Plan of Care (Signed)

## 2022-03-13 LAB — GLUCOSE, CAPILLARY
Glucose-Capillary: 156 mg/dL — ABNORMAL HIGH (ref 70–99)
Glucose-Capillary: 126 mg/dL — ABNORMAL HIGH (ref 70–99)
Glucose-Capillary: 135 mg/dL — ABNORMAL HIGH (ref 70–99)

## 2022-03-13 LAB — PHOSPHORUS: Phosphorus: 2.7 mg/dL (ref 2.5–4.6)

## 2022-03-13 LAB — TRIGLYCERIDES: Triglycerides: 92 mg/dL (ref <150)

## 2022-03-13 LAB — MAGNESIUM: Magnesium: 2 mg/dL (ref 1.7–2.4)

## 2022-03-13 MED ORDER — TRACE MINERALS CU-MN-SE-ZN 300-55-60-3000 MCG/ML IV SOLN
INTRAVENOUS | Status: AC
  Filled 2022-03-13: qty 707.2

## 2022-03-13 NOTE — Progress Notes (Addendum)
[place] Hospital Day(s): [place] op day(s): 26 Days Post-Op.   Interval History:  Patient seen and examined No fever recently --> Restarted on Zosyn 06/27 by ID; plan for 2 weeks No abdominal pain No electrolyte derangements Surgical Drains:   - Right Drain (Intraperitoneal) 1 cc [name] Pouch output 700 ccs in last 24 hours She is on FLD; on TPN Working with therapies; recommending SNF  Pathology reviewed:  -- Metastatic adenocarcinoma; colon origin -- Plan for output follow up with oncology (Dr Tasia Catchings)  Vital signs in last 24 hours: [min-max] current  Temp:  [98.2 F (36.8 C)-100 F (37.8 C)] 98.2 F (36.8 C) (07/06 0459) Pulse Rate:  [69-77] 69 (07/06 0459) Resp:  [16-18] 16 (07/06 0459) BP: (90-102)/(51-61) 90/51 (07/06 0459) SpO2:  [95 %-99 %] 95 % (07/06 0459)     Height: '4\' 11"'$  (149.9 cm) Weight: 91 kg BMI (Calculated): 40.5   Intake/Output last 2 shifts:  07/05 0701 - 07/06 0700 In: 1370 [P.O.:100; I.V.:1220; IV Piggyback:50] Out: 867 [Drains:701]   Physical Exam:  Constitutional: alert, cooperative and no distress  Respiratory: breathing non-labored at rest  Cardiovascular: regular rate and sinus rhythm  Gastrointestinal: Soft, abdominal soreness on the right, non-distended, no rebound/guarding. Surgical drain in LLQ; serosanguinous, RLQ drain output slowing; bilious Integumentary: Again, there is fascial dehiscence with exposed mesh now with feculent/bilious drainage consistent with EC fistula. Subcutaneous tissues are starting to granulate. [name] Pouch in place with good seal, red rubber present to manage effluent   Labs:     Latest Ref Rng & Units 03/12/2022   10:54 AM 03/11/2022    4:55 AM 03/08/2022    2:35 PM  CBC  WBC 4.0 - 10.5 K/uL 5.5  5.7  5.5   Hemoglobin 12.0 - 15.0 g/dL 8.9  7.5  9.8   Hematocrit 36.0 - 46.0 % 28.4  24.0  31.2   Platelets 150 - 400 K/uL 166  184  233       Latest Ref Rng & Units  03/12/2022   10:54 AM 03/11/2022    4:55 AM 03/09/2022    3:50 AM  CMP  Glucose 70 - 99 mg/dL 162  172  274   BUN 6 - 20 mg/dL 17  20  40   Creatinine 0.44 - 1.00 mg/dL 0.48  0.57  0.90   Sodium 135 - 145 mmol/L 135  136  130   Potassium 3.5 - 5.1 mmol/L 3.8  3.7  4.5   Chloride 98 - 111 mmol/L 108  108  101   CO2 22 - 32 mmol/L '22  21  22   '$ Calcium 8.9 - 10.3 mg/dL 7.6  7.5  7.2      Imaging studies: No new pertinent imaging studies    Assessment/Plan: 56 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   - Wound Care: [name] pouch in place with red rubber present to manage drainage. Anticipate fluid will pool in the wound bed while she is in bed. This will be a mix of serosanguinous fluid and bilious fluid. It is okay to adjust the red rubber catheter from time to time to help with drainage while in bed. This can be removed from suction to ambulate and work with therapies.  These pouches need changed every 7-10 days (ideally). Change next week sometime Wednesday - Friday  - Continue FLD as tolerated + nutritional supplementation - Continue TPN at goal; monitor electrolytes - Continue octreotide; 200 mcg/min TID, continue imodium   - ID on board; restarted on Zosyn 06/27; plan for 2 weeks  - Continue surgical drains; monitor and record output  - Monitor abdominal examination; on-going bowel function  - Pain control prn; antemetic prn  - Out of bed; therapies on board; recommendations are HHPT   - Discharge Planning: Anticipate lengthy admission   All of the above findings and recommendations were discussed with the patient, and the medical team, and all of patient's questions were answered to her expressed satisfaction.  -- [name],  PA-C [facility] 03/13/2022, 7:26 AM M-F: 7am - 4pm

## 2022-03-13 NOTE — Evaluation (Signed)
Occupational Therapy RE-Evaluation Patient Details Name: Debra [name] MRN: [id number] DOB: 1966-05-01 Today's Date: 03/13/2022   History of Present Illness Pt is a 56 y.o. female with PMH including hypothyroidism, iron deficient anemia, colon cancer.  Here s/p mass removal with hernia repair and appendectomy 6/8, bowel perforation repair 6/10.   Clinical Impression   Pt seen for re-evaluation and making slow progress towards goals secondary to decreased endurance. Pt needing min A for trunk support to EOB. While seated, pt performs 10 reps of knee extensions and then reports urgent need for toileting. Pt needing increased time but stands and step pivots with close supervision onto [facility] without use of AD. PT does begin urinating before fully seated on [employer] [facility] [employer] and needs assistance to clean floor for safety. Pt combs hair and washes face with set up A. Pt stands for hygiene with assistance for thoroughness for hygiene. Pt returning to bed secondary to fatigue. All needs within reach and daughter remains in room with pt. Daughter interprets throughout session.      Recommendations for follow up therapy are one component of a multi-disciplinary discharge planning process, led by the attending physician.  Recommendations may be updated based on patient status, additional functional criteria and insurance authorization.   Follow Up Recommendations  Home health OT    Assistance Recommended at Discharge Frequent or constant Supervision/Assistance  Patient can return home with the following A lot of help with walking and/or transfers;A lot of help with bathing/dressing/bathroom;Assistance with cooking/housework;Assist for transportation       Equipment Recommendations  BSC/3in1       Precautions / Restrictions Precautions Precautions: Fall Precaution Comments: multiple drains Restrictions Weight Bearing Restrictions: No Other Position/Activity Restrictions: Abdominal surgery      Mobility  Bed Mobility Overal bed mobility: Needs Assistance Bed Mobility: Supine to Sit, Sit to Supine     Supine to sit: Min assist Sit to supine: Mod assist   General bed mobility comments: Pt needing assistance for trunk support to EOB and assist with getting B LEs back into bed at end of session    Transfers Overall transfer level: Needs assistance Equipment used: None Transfers: Sit to/from Stand, Bed to chair/wheelchair/BSC Sit to Stand: Supervision     Step pivot transfers: Supervision            Balance Overall balance assessment: Needs assistance Sitting-balance support: Feet supported Sitting balance-Leahy Scale: Good     Standing balance support: During functional activity Standing balance-Leahy Scale: Fair                             ADL either performed or assessed with clinical judgement   ADL Overall ADL's : Needs assistance/impaired                         Toilet Transfer: Supervision/safety;BSC/3in1   Writer and Hygiene: Minimal assistance Toileting - Clothing Manipulation Details (indicate cue type and reason): assist for thoroughness of hygiene             Vision Patient Visual Report: No change from baseline              Pertinent Vitals/Pain Pain Assessment Pain Assessment: No/denies pain        Extremity/Trunk Assessment Upper Extremity Assessment Upper Extremity Assessment: Overall WFL for tasks assessed;Generalized weakness   Lower Extremity Assessment Lower Extremity Assessment: Overall WFL for tasks assessed;Generalized weakness  Cognition Arousal/Alertness: Awake/alert Behavior During Therapy: WFL for tasks assessed/performed, Flat affect Overall Cognitive Status: Within Functional Limits for tasks assessed                                 General Comments: Patient's daughter used for interpretation       OT Goals(Current goals can be found in the  care plan section) Acute Rehab OT Goals Patient Stated Goal: to get stronger OT Goal Formulation: With patient/family Time For Goal Achievement: 04/10/22 Potential to Achieve Goals: Good ADL Goals Pt Will Perform Grooming: standing;with modified independence  OT Frequency: Min 2X/week       AM-PAC OT "6 Clicks" Daily Activity     Outcome Measure Help from another person eating meals?: None Help from another person taking care of personal grooming?: A Little Help from another person toileting, which includes using toliet, bedpan, or urinal?: A Little Help from another person bathing (including washing, rinsing, drying)?: A Little Help from another person to put on and taking off regular upper body clothing?: A Little Help from another person to put on and taking off regular lower body clothing?: A Little 6 Click Score: 19   End of Session    Activity Tolerance: Patient limited by fatigue Patient left: in bed;with call bell/phone within reach;with bed alarm set;with family/visitor present  OT Visit Diagnosis: Unsteadiness on feet (R26.81)                Time: 0263-7858 OT Time Calculation (min): 17 min Charges:  OT General Charges $OT Visit: 1 Visit OT Evaluation $OT Re-eval: 1 Re-eval OT Treatments $Self Care/Home Management : 8-22 mins

## 2022-03-13 NOTE — Progress Notes (Signed)
PHARMACY - TOTAL PARENTERAL NUTRITION CONSULT NOTE   Indication: Prolonged ileus  Patient Measurements: Height: '4\' 11"'$  (149.9 cm) Weight: 91 kg (200 lb 9.9 oz) IBW/kg (Calculated) : 43.2 TPN AdjBW (KG): 55.2 Body mass index is 40.52 kg/m.  Assessment:  56 y.o. female  s/p laparotomy, excision of greater omental mass, abdominal wall reconstruction with [name],appendectomy, and placement of Prevena vac.  Glucose / Insulin: no insulin requirements prior to TPN BG 108-186 --insulin requirements previous 24h: 6 units novolog SSI (mod TIDw/meals) and 30 units of regular insulin in TPN bag   *SSI ordered 3 times daily w/ meals, CBG ordered q6h   Electrolytes: WNL, Na 135   corrected Ca 8.7 Renal: Scr<1 Hepatic: LFTs wnl Intake / Output; MIVF: (+6.3L this admit) / NS'@75ml'$ /h GI Imaging: GI Surgeries / Procedures: s/p laparotomy, excision of greater omental mass, abdominal wall reconstruction with [name],appendectomy, and placement of Prevena vac  Central access: 02/15/22 TPN start date: 02/15/22  Nutritional Goals: Goal TPN rate of 65 ml/h (provides 106 g of protein and 1872 kcals per day)  RD Assessment:  Estimated Needs Total Energy Estimated Needs: 1800-2100kcal/day Total Protein Estimated Needs: 90-110g/d Total Fluid Estimated Needs: 1.4-1.6L/day  Current Nutrition:  resuming CLD 7/2 7/5 continued FLD + nutritional supplementation  Plan 03/12/22:  Continue TPN at 34m/hr(new goal rate) Nutritional Components Amino acids (using 15% Clinisol): 106 grams Dextrose: 265.2 grams Lipids (using 20% SMOFlipids): 54.6 grams kCal: 1872/24h  Electrolytes in TPN (standard): Na 598m/L, K 5018mL, Ca 5mE28m, Mg 5mEq35m and Phos 25 mmol/L. Cl:Ac 1:1 Add trace elements to TPN, MVI changed to PO  Will continue insulin 30 units in TPN, may need to increase if BG increases, (last dose of Glargine was 7/2 am) SSI three times daily with meals (CBG q6h) Monitor  TPN labs on Mon/Thurs  WalidPearla DubonnetrmD Clinical Pharmacist 03/13/2022 11:34 AM

## 2022-03-14 LAB — BASIC METABOLIC PANEL
Anion gap: 4 — ABNORMAL LOW (ref 5–15)
BUN: 18 mg/dL (ref 6–20)
CO2: 21 mmol/L — ABNORMAL LOW (ref 22–32)
Calcium: 7.4 mg/dL — ABNORMAL LOW (ref 8.9–10.3)
Chloride: 112 mmol/L — ABNORMAL HIGH (ref 98–111)
Creatinine, Ser: 0.49 mg/dL (ref 0.44–1.00)
GFR, Estimated: >60 mL/min (ref >60)
Glucose, Bld: 170 mg/dL — ABNORMAL HIGH (ref 70–99)
Potassium: 3.7 mmol/L (ref 3.5–5.1)
Sodium: 137 mmol/L (ref 135–145)

## 2022-03-14 LAB — GLUCOSE, CAPILLARY
Glucose-Capillary: 145 mg/dL — ABNORMAL HIGH (ref 70–99)
Glucose-Capillary: 153 mg/dL — ABNORMAL HIGH (ref 70–99)
Glucose-Capillary: 181 mg/dL — ABNORMAL HIGH (ref 70–99)
Glucose-Capillary: 133 mg/dL — ABNORMAL HIGH (ref 70–99)

## 2022-03-14 LAB — PHOSPHORUS: Phosphorus: 2.7 mg/dL (ref 2.5–4.6)

## 2022-03-14 LAB — MAGNESIUM: Magnesium: 1.9 mg/dL (ref 1.7–2.4)

## 2022-03-14 MED ORDER — TRACE MINERALS CU-MN-SE-ZN 300-55-60-3000 MCG/ML IV SOLN
INTRAVENOUS | Status: AC
  Filled 2022-03-14: qty 707.2

## 2022-03-14 NOTE — Progress Notes (Signed)
   03/14/22 1500  Clinical Encounter Type  Visited With Patient and family together  Visit Type Initial;Spiritual support  Referral From Palliative care team  Consult/Referral To [place] responded to referral to provide support to spanish only patient. Chaplain provided opportunity for conversation through compassionate presence. Patient expressed grief and even though she appeared weak, was conversant. She expressed much appreciation for visit and prayer.

## 2022-03-14 NOTE — Progress Notes (Signed)
PHARMACY - TOTAL PARENTERAL NUTRITION CONSULT NOTE   Indication: Prolonged ileus  Patient Measurements: Height: '4\' 11"'$  (149.9 cm) Weight: 91 kg (200 lb 9.9 oz) IBW/kg (Calculated) : 43.2 TPN AdjBW (KG): 55.2 Body mass index is 40.52 kg/m.  Assessment:  56 y.o. female  s/p laparotomy, excision of greater omental mass, abdominal wall reconstruction with [name],appendectomy, and placement of Prevena vac.  Glucose / Insulin: no insulin requirements prior to TPN BG 126-170 --insulin requirements previous 24h: 7 units novolog SSI (mod TIDw/meals) and 30 units of regular insulin in TPN bag   *SSI ordered 3 times daily w/ meals, CBG ordered q6h   Electrolytes: WNL, Na 135   corrected Ca 8.7 Renal: Scr<1 Hepatic: LFTs wnl Intake / Output; MIVF: (+7.1L this admit) / NS'@75ml'$ /h GI Imaging: GI Surgeries / Procedures: s/p laparotomy, excision of greater omental mass, abdominal wall reconstruction with [name],appendectomy, and placement of Prevena vac  Central access: 02/15/22 TPN start date: 02/15/22  Nutritional Goals: Goal TPN rate of 65 ml/h (provides 106 g of protein and 1872 kcals per day)  RD Assessment:  Estimated Needs Total Energy Estimated Needs: 1800-2100kcal/day Total Protein Estimated Needs: 90-110g/d Total Fluid Estimated Needs: 1.4-1.6L/day  Current Nutrition:  resuming CLD 7/2 7/5 continued FLD + nutritional supplementation  Plan 03/12/22:  Continue TPN at 42m/hr(new goal rate) Nutritional Components Amino acids (using 15% Clinisol): 106 grams Dextrose: 265.2 grams Lipids (using 20% SMOFlipids): 54.6 grams kCal: 1872/24h  Electrolytes in TPN (standard): Na 550m/L, K 705mL, Ca 5mE86m, Mg 10mE16m and Phos 25 mmol/L. Cl:Ac 1:1 Add trace elements to TPN, MVI changed to PO  Will continue insulin 30 units in TPN, may need to increase if BG increases, (last dose of Glargine was 7/2 am) SSI three times daily with meals (CBG  q6h) Monitor TPN labs on Mon/Thurs  WalidPearla DubonnetrmD Clinical Pharmacist 03/14/2022 2:30 PM

## 2022-03-14 NOTE — Progress Notes (Signed)
[place] Hospital Day(s): 29.   Post op day(s): 27 Days Post-Op.   Interval History:  Patient seen and examined No fever recently --> Restarted on Zosyn 06/27 by ID; plan for 2 weeks No abdominal pain Renal function remains normal; sCr - 0.49; UO - unmeasured  No significant electrolyte derangements  Surgical Drains:   - Right Drain (Intraperitoneal); no output [name] Pouch output 1600 ccs in last 24 hours She is on FLD; on TPN Working with therapies; recommending SNF  Pathology reviewed:  -- Metastatic adenocarcinoma; colon origin -- Plan for output follow up with oncology (Dr Tasia Catchings)  Vital signs in last 24 hours: [min-max] current  Temp:  [98.4 F (36.9 C)-99.2 F (37.3 C)] 99.2 F (37.3 C) (07/06 1920) Pulse Rate:  [55-77] 77 (07/06 1920) Resp:  [18-20] 18 (07/06 1920) BP: (90-109)/(51-62) 100/52 (07/06 1920) SpO2:  [85 %-100 %] 93 % (07/06 1920)     Height: '4\' 11"'$  (149.9 cm) Weight: 91 kg BMI (Calculated): 40.5   Intake/Output last 2 shifts:  07/06 0701 - 07/07 0700 In: 3890 [P.O.:840; I.V.:2849.9; IV Piggyback:200] Out: 1600 [Drains:1600]   Physical Exam:  Constitutional: alert, cooperative and no distress  Respiratory: breathing non-labored at rest  Cardiovascular: regular rate and sinus rhythm  Gastrointestinal: Soft, abdominal soreness on the right, non-distended, no rebound/guarding. Surgical drain in RLQ drain; thicker serous output  Integumentary: Again, there is fascial dehiscence with exposed mesh now with feculent/bilious drainage consistent with EC fistula. Subcutaneous tissues are starting to granulate. [name] Pouch in place with good seal, red rubber present to manage effluent   Labs:     Latest Ref Rng & Units 03/12/2022   10:54 AM 03/11/2022    4:55 AM 03/08/2022    2:35 PM  CBC  WBC 4.0 - 10.5 K/uL 5.5  5.7  5.5   Hemoglobin 12.0 - 15.0 g/dL 8.9  7.5  9.8   Hematocrit 36.0 - 46.0 % 28.4  24.0  31.2   Platelets  150 - 400 K/uL 166  184  233       Latest Ref Rng & Units 03/14/2022    4:57 AM 03/12/2022   10:54 AM 03/11/2022    4:55 AM  CMP  Glucose 70 - 99 mg/dL 170  162  172   BUN 6 - 20 mg/dL '18  17  20   '$ Creatinine 0.44 - 1.00 mg/dL 0.49  0.48  0.57   Sodium 135 - 145 mmol/L 137  135  136   Potassium 3.5 - 5.1 mmol/L 3.7  3.8  3.7   Chloride 98 - 111 mmol/L 112  108  108   CO2 22 - 32 mmol/L '21  22  21   '$ Calcium 8.9 - 10.3 mg/dL 7.4  7.6  7.5      Imaging studies: No new pertinent imaging studies    Assessment/Plan: 56 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   - Wound Care: [name] pouch in place with red rubber present to manage drainage. Anticipate fluid will pool in the wound bed while she is in bed. This will be a mix of serosanguinous fluid and bilious fluid. It is okay to adjust the red rubber catheter from time to time to help with drainage while in bed.  This can be removed from suction to ambulate and work with therapies. These pouches need changed every 7-10 days (ideally). Change next week sometime Wednesday - Friday  - Continue FLD as tolerated + nutritional supplementation; Will discuss advancing to regular diet now that we have good control over her EC fistula when Dr Dahlia Byes is available at the start of next week.  - Continue TPN at goal; monitor electrolytes - Continue octreotide; 200 mcg/min TID, continue imodium   - ID on board; restarted on Zosyn 06/27; plan for 2 weeks  - Continue surgical drains; monitor and record output  - Monitor abdominal examination; on-going bowel function  - Pain control prn; antemetic prn  - Out of bed; therapies on board; recommendations are HHPT   - Discharge Planning: Anticipate lengthy  admission   All of the above findings and recommendations were discussed with the patient, and the medical team, and all of patient's questions were answered to her expressed satisfaction.  -- [name], PA-C [facility] 03/14/2022, 7:25 AM M-F: 7am - 4pm

## 2022-03-14 NOTE — Progress Notes (Signed)
End of shift note:  Pt continuous to have low BP with a MAP above 65. This morning she had one episode of dry heaving and IV Zofran was given. Abdominal wound output was 1000 ml. Chaplain visit the patient and palliative care will be consulted.

## 2022-03-14 NOTE — Progress Notes (Signed)
Physical Therapy Treatment Patient Details Name: Debra [name] MRN: [id number] DOB: 12/11/1965 Today's Date: 03/14/2022   History of Present Illness Pt is a 56 y.o. female with PMH including hypothyroidism, iron deficient anemia, colon cancer.  Here s/p mass removal with hernia repair and appendectomy 6/8, bowel perforation repair 6/10.    PT Comments    Patient alert, agreeable to PT sitting EOB with family with shower cap. Recently finished up on BSC. She was able to stand with CGA and no AD, and ambulate small circles in room ~29f. Very slow, cautious steps, some increased RR noted but no LOB. Pt deferred further ambulation but did perform a few seated exercises. Returned to supine with minA. The patient would benefit from further skilled PT intervention to continue to progress towards goals. Recommendation remains appropriate.     Recommendations for follow up therapy are one component of a multi-disciplinary discharge planning process, led by the attending physician.  Recommendations may be updated based on patient status, additional functional criteria and insurance authorization.  Follow Up Recommendations  Skilled nursing-short term rehab (<3 hours/day) Can patient physically be transported by private vehicle: Yes   Assistance Recommended at Discharge Frequent or constant Supervision/Assistance  Patient can return home with the following Help with stairs or ramp for entrance;Assist for transportation;Assistance with cooking/housework;A lot of help with bathing/dressing/bathroom;A little help with walking and/or transfers   Equipment Recommendations  Rolling walker (2 wheels);Hospital bed;Wheelchair cushion (measurements PT);Wheelchair (measurements PT);BSC/3in1    Recommendations for Other Services       Precautions / Restrictions Precautions Precautions: Fall Restrictions Weight Bearing Restrictions: No     Mobility  Bed Mobility Overal bed mobility: Needs  Assistance Bed Mobility: Sit to Supine       Sit to supine: Min assist        Transfers Overall transfer level: Needs assistance Equipment used: None Transfers: Sit to/from Stand Sit to Stand: Supervision                Ambulation/Gait Ambulation/Gait assistance: Min guard Gait Distance (Feet): 12 Feet Assistive device: None Gait Pattern/deviations: Step-to pattern, Decreased step length - right, Decreased step length - left, Decreased stride length, Wide base of support Gait velocity: decreased     General Gait Details: Short, slow steps   Stairs             Wheelchair Mobility    Modified Rankin (Stroke Patients Only)       Balance Overall balance assessment: Needs assistance Sitting-balance support: Feet supported Sitting balance-Leahy Scale: Good     Standing balance support: During functional activity Standing balance-Leahy Scale: Fair                              Cognition Arousal/Alertness: Awake/alert Behavior During Therapy: WFL for tasks assessed/performed, Flat affect Overall Cognitive Status: Within Functional Limits for tasks assessed                                 General Comments: Patient's daughter used for interpretation        Exercises Other Exercises Other Exercises: seated LAQ, heel raises, and toe raises x10 bilaterally    General Comments        Pertinent Vitals/Pain Pain Assessment Pain Assessment: No/denies pain    Home Living  Prior Function            PT Goals (current goals can now be found in the care plan section) Progress towards PT goals: Progressing toward goals    Frequency    Min 2X/week      PT Plan Current plan remains appropriate    Co-evaluation              AM-PAC PT "6 Clicks" Mobility   Outcome Measure  Help needed turning from your back to your side while in a flat bed without using bedrails?: A  Little Help needed moving from lying on your back to sitting on the side of a flat bed without using bedrails?: A Little Help needed moving to and from a bed to a chair (including a wheelchair)?: A Little Help needed standing up from a chair using your arms (e.g., wheelchair or bedside chair)?: A Little Help needed to walk in hospital room?: A Little Help needed climbing 3-5 steps with a railing? : A Lot 6 Click Score: 17    End of Session   Activity Tolerance: Patient tolerated treatment well Patient left: in bed;with bed alarm set;with call bell/phone within reach Nurse Communication: Mobility status PT Visit Diagnosis: Muscle weakness (generalized) (M62.81);Difficulty in walking, not elsewhere classified (R26.2)     Time: 8592-7639 PT Time Calculation (min) (ACUTE ONLY): 13 min  Charges:  $Therapeutic Activity: 8-22 mins                     Lieutenant Diego PT, DPT 3:31 PM,03/14/22

## 2022-03-15 LAB — GLUCOSE, CAPILLARY
Glucose-Capillary: 158 mg/dL — ABNORMAL HIGH (ref 70–99)
Glucose-Capillary: 149 mg/dL — ABNORMAL HIGH (ref 70–99)
Glucose-Capillary: 141 mg/dL — ABNORMAL HIGH (ref 70–99)
Glucose-Capillary: 126 mg/dL — ABNORMAL HIGH (ref 70–99)

## 2022-03-15 MED ORDER — TRACE MINERALS CU-MN-SE-ZN 300-55-60-3000 MCG/ML IV SOLN
INTRAVENOUS | Status: AC
  Filled 2022-03-15: qty 707.2

## 2022-03-15 NOTE — Progress Notes (Signed)
Patient ID: Debra [name], female   DOB: 1965-12-01, 56 y.o.   MRN: [id number]     [place] Hospital Day(s): 30.   Interval History: Patient seen and examined, no acute events or new complaints overnight. Patient reports feeling well today.  She denies any new complaint.  She denies any nausea or vomiting.  She denies any issues with the fistula bag.  Vital signs in last 24 hours: [min-max] current  Temp:  [98.5 F (36.9 C)-98.9 F (37.2 C)] 98.9 F (37.2 C) (07/08 0827) Pulse Rate:  [80-103] 80 (07/08 0827) Resp:  [16-18] 18 (07/08 0827) BP: (99-108)/(49-59) 100/49 (07/08 0827) SpO2:  [91 %-93 %] 92 % (07/08 0827)     Height: '4\' 11"'$  (149.9 cm) Weight: 91 kg BMI (Calculated): 40.5   Physical Exam:  Constitutional: alert, cooperative and no distress  Respiratory: breathing non-labored at rest  Cardiovascular: regular rate and sinus rhythm  Gastrointestinal: soft, non-tender, and non-distended.  Large open abdominal wound.  Mesh exposed.  Bilious and enteric content in bag.  Adequate seal.  Labs:     Latest Ref Rng & Units 03/12/2022   10:54 AM 03/11/2022    4:55 AM 03/08/2022    2:35 PM  CBC  WBC 4.0 - 10.5 K/uL 5.5  5.7  5.5   Hemoglobin 12.0 - 15.0 g/dL 8.9  7.5  9.8   Hematocrit 36.0 - 46.0 % 28.4  24.0  31.2   Platelets 150 - 400 K/uL 166  184  233       Latest Ref Rng & Units 03/14/2022    4:57 AM 03/12/2022   10:54 AM 03/11/2022    4:55 AM  CMP  Glucose 70 - 99 mg/dL 170  162  172   BUN 6 - 20 mg/dL '18  17  20   '$ Creatinine 0.44 - 1.00 mg/dL 0.49  0.48  0.57   Sodium 135 - 145 mmol/L 137  135  136   Potassium 3.5 - 5.1 mmol/L 3.7  3.8  3.7   Chloride 98 - 111 mmol/L 112  108  108   CO2 22 - 32 mmol/L '21  22  21   '$ Calcium 8.9 - 10.3 mg/dL 7.4  7.6  7.5     Imaging studies: No new pertinent imaging studies   Assessment/Plan:  56 y.o. female with metastatic colon cancer s/p omentectomy and incisional hernia repair 28 Days Post-Op, complicated by  pertinent comorbidities including enterocutaneous fistula.  -Patient seems more comfortable.  She denies any complaint.  To continue with a high output enteric fistula.  She is getting full liquid diet for comfort. -Continue IV antibiotics as per recommended by IV -Continue monitoring fistula output.  We will continue with culture tried on Imodium. -Continue TPN -Continue pain management -I encouraged the patient to get out of bed and ambulate.  [name], MD

## 2022-03-15 NOTE — Progress Notes (Signed)
PHARMACY - TOTAL PARENTERAL NUTRITION CONSULT NOTE   Indication: Prolonged ileus  Patient Measurements: Height: '4\' 11"'$  (149.9 cm) Weight: 91 kg (200 lb 9.9 oz) IBW/kg (Calculated) : 43.2 TPN AdjBW (KG): 55.2 Body mass index is 40.52 kg/m.  Assessment:  56 y.o. female  s/p laparotomy, excision of greater omental mass, abdominal wall reconstruction with [name],appendectomy, and placement of Prevena vac.  Glucose / Insulin: no insulin requirements prior to TPN BG 126-170 --insulin requirements previous 24h: 8 units novolog SSI (mod TIDw/meals) and 30 units of regular insulin in TPN bag   *SSI ordered 3 times daily w/ meals, CBG ordered q6h   Electrolytes: WNL, Na 135   corrected Ca 8.7 Renal: Scr<1 Hepatic: LFTs wnl Intake / Output; MIVF: (+6.6L this admit) / NS'@75ml'$ /h GI Imaging: GI Surgeries / Procedures: s/p laparotomy, excision of greater omental mass, abdominal wall reconstruction with [name],appendectomy, and placement of Prevena vac  Central access: 02/15/22 TPN start date: 02/15/22  Nutritional Goals: Goal TPN rate of 65 ml/h (provides 106 g of protein and 1872 kcals per day)  RD Assessment:  Estimated Needs Total Energy Estimated Needs: 1800-2100kcal/day Total Protein Estimated Needs: 90-110g/d Total Fluid Estimated Needs: 1.4-1.6L/day  Current Nutrition:  resuming CLD 7/2 7/5 continued FLD + nutritional supplementation  Plan 03/12/22:  Continue TPN at 62m/hr(new goal rate) Nutritional Components Amino acids (using 15% Clinisol): 106 grams Dextrose: 265.2 grams Lipids (using 20% SMOFlipids): 54.6 grams kCal: 1872/24h  Electrolytes in TPN (standard): Na 576m/L, K 7060mL, Ca 5mE34m, Mg 10mE56m and Phos 25 mmol/L. Cl:Ac 1:1 Add trace elements to TPN, MVI changed to PO  Will continue insulin 30 units in TPN, may need to increase if BG increases, (last dose of Glargine was 7/2 am) SSI three times daily with meals (CBG  q6h) Monitor TPN labs on Mon/Thurs  WalidPearla DubonnetrmD Clinical Pharmacist 03/15/2022 9:30 AM

## 2022-03-16 LAB — GLUCOSE, CAPILLARY
Glucose-Capillary: 138 mg/dL — ABNORMAL HIGH (ref 70–99)
Glucose-Capillary: 152 mg/dL — ABNORMAL HIGH (ref 70–99)
Glucose-Capillary: 171 mg/dL — ABNORMAL HIGH (ref 70–99)

## 2022-03-16 MED ORDER — TRACE MINERALS CU-MN-SE-ZN 300-55-60-3000 MCG/ML IV SOLN
INTRAVENOUS | Status: AC
  Filled 2022-03-16: qty 707.2

## 2022-03-16 NOTE — Progress Notes (Signed)
PHARMACY - TOTAL PARENTERAL NUTRITION CONSULT NOTE   Indication: Prolonged ileus  Patient Measurements: Height: '4\' 11"'$  (149.9 cm) Weight: 91 kg (200 lb 9.9 oz) IBW/kg (Calculated) : 43.2 TPN AdjBW (KG): 55.2 Body mass index is 40.52 kg/m.  Assessment:  56 y.o. female  s/p laparotomy, excision of greater omental mass, abdominal wall reconstruction with [name],appendectomy, and placement of Prevena vac.  Glucose / Insulin: no insulin requirements prior to TPN BG 126-170 --insulin requirements previous 24h: 8 units novolog SSI (mod TIDw/meals) and 30 units of regular insulin in TPN bag   *SSI ordered 3 times daily w/ meals, CBG ordered q6h   Electrolytes: WNL Renal: Scr<1 Hepatic: LFTs wnl Intake / Output; MIVF: (+6.2L this admit) / NS'@75ml'$ /h GI Imaging: GI Surgeries / Procedures: s/p laparotomy, excision of greater omental mass, abdominal wall reconstruction with [name],appendectomy, and placement of Prevena vac  Central access: 02/15/22 TPN start date: 02/15/22  Nutritional Goals: Goal TPN rate of 65 ml/h (provides 106 g of protein and 1872 kcals per day)  RD Assessment:  Estimated Needs Total Energy Estimated Needs: 1800-2100kcal/day Total Protein Estimated Needs: 90-110g/d Total Fluid Estimated Needs: 1.4-1.6L/day  Current Nutrition:  resuming CLD 7/2 7/5 continued FLD + nutritional supplementation  Plan 03/12/22:  Continue TPN at 13m/hr(new goal rate) Nutritional Components Amino acids (using 15% Clinisol): 106 grams Dextrose: 265.2 grams Lipids (using 20% SMOFlipids): 54.6 grams kCal: 1872/24h  Electrolytes in TPN (standard): Na 513m/L, K 7059mL, Ca 5mE39m, Mg 10mE46m and Phos 25 mmol/L. Cl:Ac 1:1 Add trace elements to TPN, MVI changed to PO  Will continue insulin 30 units in TPN, may need to increase if BG increases, (last dose of Glargine was 7/2 am) SSI three times daily with meals (CBG q6h) Monitor TPN labs on  Mon/Thurs  WalidPearla DubonnetrmD Clinical Pharmacist 03/16/2022 10:51 AM

## 2022-03-16 NOTE — Plan of Care (Signed)
Pt AAOx4, no pain. VS are stable. Plan for mobility as tolerated. Bed is in lowest position, call light within reach. Will continue to monitor.

## 2022-03-16 NOTE — Progress Notes (Signed)
Patient ID: Debra [name], female   DOB: 04-Sep-1966, 56 y.o.   MRN: [id number]     [place] Hospital Day(s): 31.   Interval History: Patient seen and examined, no acute events or new complaints overnight. Patient reports feeling well.  She denies any new complaint.  She denies significant abdominal pain.  She denies any nausea or vomiting.  Vital signs in last 24 hours: [min-max] current  Temp:  [98.3 F (36.8 C)-100.5 F (38.1 C)] 99.1 F (37.3 C) (07/09 0753) Pulse Rate:  [75-87] 86 (07/09 0753) Resp:  [16-20] 16 (07/09 0753) BP: (102-120)/(53-66) 113/53 (07/09 0753) SpO2:  [94 %-100 %] 95 % (07/09 0753) Weight:  [91 kg] 91 kg (07/09 0600)     Height: '4\' 11"'$  (149.9 cm) Weight: 91 kg BMI (Calculated): 40.5   Physical Exam:  Constitutional: alert, cooperative and no distress  Respiratory: breathing non-labored at rest  Cardiovascular: regular rate and sinus rhythm  Gastrointestinal: soft, non-tender, and non-distended.  Large open wound with exposed mesh.  Bilious and enteric content.  Drain output 1250 in the last 24 hours.  Labs:     Latest Ref Rng & Units 03/12/2022   10:54 AM 03/11/2022    4:55 AM 03/08/2022    2:35 PM  CBC  WBC 4.0 - 10.5 K/uL 5.5  5.7  5.5   Hemoglobin 12.0 - 15.0 g/dL 8.9  7.5  9.8   Hematocrit 36.0 - 46.0 % 28.4  24.0  31.2   Platelets 150 - 400 K/uL 166  184  233       Latest Ref Rng & Units 03/14/2022    4:57 AM 03/12/2022   10:54 AM 03/11/2022    4:55 AM  CMP  Glucose 70 - 99 mg/dL 170  162  172   BUN 6 - 20 mg/dL '18  17  20   '$ Creatinine 0.44 - 1.00 mg/dL 0.49  0.48  0.57   Sodium 135 - 145 mmol/L 137  135  136   Potassium 3.5 - 5.1 mmol/L 3.7  3.8  3.7   Chloride 98 - 111 mmol/L 112  108  108   CO2 22 - 32 mmol/L '21  22  21   '$ Calcium 8.9 - 10.3 mg/dL 7.4  7.6  7.5     Imaging studies: No new pertinent imaging studies   Assessment/Plan:  56 y.o. female with metastatic colon cancer s/p omentectomy and incisional hernia repair  29 Days Post-Op, complicated by pertinent comorbidities including enterocutaneous fistula.  -No clinical deterioration.  There has been no fever.  Vital signs stable. -Still with high output enterocutaneous fistula.  We will continue with current management with full liquid diet, after tried, Imodium. -We will continue with antibiotic therapy as per recommended by the for sepsis control. -We will continue with TPN for nutrition -We will continue with pain management -We will continue with DVT prophylaxis -I encouraged the patient to get out of bed and ambulate as tolerated.  [name], MD

## 2022-03-17 ENCOUNTER — Inpatient Hospital Stay: Payer: Self-pay

## 2022-03-17 LAB — GLUCOSE, CAPILLARY
Glucose-Capillary: 144 mg/dL — ABNORMAL HIGH (ref 70–99)
Glucose-Capillary: 126 mg/dL — ABNORMAL HIGH (ref 70–99)
Glucose-Capillary: 144 mg/dL — ABNORMAL HIGH (ref 70–99)
Glucose-Capillary: 123 mg/dL — ABNORMAL HIGH (ref 70–99)

## 2022-03-17 LAB — TRIGLYCERIDES: Triglycerides: 83 mg/dL (ref <150)

## 2022-03-17 LAB — MAGNESIUM: Magnesium: 2.2 mg/dL (ref 1.7–2.4)

## 2022-03-17 LAB — PHOSPHORUS: Phosphorus: 2.8 mg/dL (ref 2.5–4.6)

## 2022-03-17 MED ORDER — LEVOTHYROXINE SODIUM 137 MCG PO TABS
137.0000 ug | ORAL_TABLET | Freq: Every day | ORAL | Status: DC
  Administered 2022-03-18 – 2023-04-26 (×398): 137 ug via ORAL
  Filled 2022-03-17 (×411): qty 1

## 2022-03-17 MED ORDER — BOOST / RESOURCE BREEZE PO LIQD CUSTOM
1.0000 | Freq: Three times a day (TID) | ORAL | Status: DC
  Administered 2022-03-17 – 2022-04-22 (×87): 1 via ORAL

## 2022-03-17 MED ORDER — TRACE MINERALS CU-MN-SE-ZN 300-55-60-3000 MCG/ML IV SOLN
INTRAVENOUS | Status: AC
  Filled 2022-03-17: qty 707.2

## 2022-03-17 MED ORDER — SODIUM CHLORIDE 0.9% FLUSH
10.0000 mL | INTRAVENOUS | Status: DC | PRN
  Administered 2022-04-09 – 2022-09-09 (×4): 10 mL

## 2022-03-17 MED ORDER — SODIUM CHLORIDE 0.9% FLUSH
10.0000 mL | Freq: Two times a day (BID) | INTRAVENOUS | Status: DC
  Administered 2022-03-17 – 2022-06-20 (×145): 10 mL
  Administered 2022-06-21 – 2022-06-22 (×2): 20 mL
  Administered 2022-06-22 – 2022-07-15 (×39): 10 mL
  Administered 2022-07-15 – 2022-07-16 (×2): 20 mL
  Administered 2022-07-16 – 2022-07-20 (×9): 10 mL
  Administered 2022-07-21: 20 mL
  Administered 2022-07-21 – 2022-07-22 (×2): 10 mL
  Administered 2022-07-22: 20 mL
  Administered 2022-07-23 – 2022-08-30 (×60): 10 mL
  Administered 2022-08-31: 20 mL
  Administered 2022-08-31 – 2022-09-10 (×13): 10 mL
  Administered 2022-09-10: 20 mL
  Administered 2022-09-11 – 2022-10-08 (×45): 10 mL
  Administered 2022-10-08: 40 mL
  Administered 2022-10-09 – 2022-10-18 (×19): 10 mL

## 2022-03-17 MED ORDER — PANTOPRAZOLE SODIUM 40 MG PO TBEC
40.0000 mg | DELAYED_RELEASE_TABLET | Freq: Two times a day (BID) | ORAL | Status: DC
  Administered 2022-03-17 – 2022-05-28 (×143): 40 mg via ORAL
  Filled 2022-03-17 (×145): qty 1

## 2022-03-17 MED ORDER — HEPARIN SOD (PORK) LOCK FLUSH 100 UNIT/ML IV SOLN: 500.0000 [IU] | INTRAVENOUS | Status: DC | PRN

## 2022-03-17 MED ORDER — HEPARIN SOD (PORK) LOCK FLUSH 100 UNIT/ML IV SOLN
500.0000 [IU] | INTRAVENOUS | Status: DC
  Administered 2022-03-17 – 2022-07-15 (×2): 500 [IU]
  Filled 2022-03-17: qty 5

## 2022-03-17 MED ORDER — SODIUM CHLORIDE 0.9 % IV SOLN
25.0000 ug/h | INTRAVENOUS | Status: DC
  Administered 2022-03-17 – 2022-03-28 (×10): 25 ug/h via INTRAVENOUS
  Filled 2022-03-17 (×16): qty 1

## 2022-03-17 NOTE — Progress Notes (Signed)
PIV consult: Assessed BUE with [name]. No suitable vein found for PIV, midline or possible PICC. TPN continues via implanted port. Please consider a central line for prolonged infusions.

## 2022-03-17 NOTE — TOC Progression Note (Signed)
Transition of Care [facility]) - Progression Note    Patient Details  Name: Debra [name] MRN: [id number] Date of Birth: 05-19-1966  Transition of [facility] CM/SW [place], RN Phone Number: 03/17/2022, 9:19 AM  Clinical Narrative:    Patient continues with TON, TOC continues to follow and assist with DC planning when medically stable to DC   Expected Discharge Plan: [place] Barriers to Discharge: Continued Medical Work up  Expected Discharge Plan and Services Expected Discharge Plan: [place]                                               Social Determinants of Health (SDOH) Interventions    Readmission Risk Interventions     No data to display

## 2022-03-17 NOTE — Progress Notes (Signed)
PT Cancellation Note  Patient Details Name: Debra [name] MRN: [id number] DOB: 04-08-1966   Cancelled Treatment:    Reason Eval/Treat Not Completed: Fatigue/lethargy limiting ability to participate. Pt reported just lying back down to rest 6-8 minutes prior to our arrival after having just been up to use the bathroom and complete sit-to-stands with other disciplines. Pt politely declined PT at this time. PT to re-attempt next date.  Debra [name], SPT  Debra [name] 03/17/2022, 3:25 PM

## 2022-03-17 NOTE — Progress Notes (Signed)
Nutrition Follow-up  DOCUMENTATION CODES:   Obesity unspecified  INTERVENTION:   Continue TPN per pharmacy- provides 1872kcal/day and 106g/day protein   Nepro Shake po TID, each supplement provides 425 kcal and 19 grams protein  Daily weights   NUTRITION DIAGNOSIS:   Increased nutrient needs related to wound healing, catabolic illness as evidenced by estimated needs.  GOAL:   Patient will meet greater than or equal to 90% of their needs -met with TPN   MONITOR:   PO intake, Supplement acceptance, Labs, Weight trends, Diet advancement, I & O's. TPN  ASSESSMENT:   56 y/o female with h/o hypothyroidism, COVID 19 (3/21), kidney stones and stage 3 colon cancer (s/p left hemicolectomy 5/21 and chemotherapy) who is admitted for new pelvic mass now s/p laparotomy 6/8 (with excision of pelvic mass from greater omentum, abdominal wall reconstruction with bilateral myocutaneous flaps and mesh, incisional hernia repair, appendectomy repair and VAC placement) complicated by bowel perforation s/p reopening of recent laparotomy 6/10 (with repair of small bowel perforation, excision of mesh, placement of two phasix mesh and VAC placement). Pathology returned as metastatic adenocarcinoma.   Pt tolerating TPN at goal rate. Refeed labs stable. Triglycerides wnl. Hyperglycemia improved. Per chart, pt appears weight stable since admission. Pt remains on a full liquid diet; pt with fair appetite and oral intake. Pt is drinking some Nepro. [name] pouch with 916m output.    Medications reviewed and include: lovenox, insulin, L-glutamine, synthroid, imodium, MVI, octreotide, protonix, carafate, zosyn  Labs reviewed: K 3.7 wnl, P 2.8 wnl, Mg 2.2 wnl Triglycerides- 83 Hgb 8.9(L), Hct 28.4(L) Cbgs- 126, 144 x 24 hrs  Diet Order:   Diet Order             Diet full liquid Room service appropriate? Yes; Fluid consistency: Thin  Diet effective now                  EDUCATION NEEDS:   Not  appropriate for education at this time  Skin:  Skin Assessment: Skin Integrity Issues: Skin Integrity Issues:: Incisions Incisions: closed abdomen  Last BM:  7/6  Height:   Ht Readings from Last 1 Encounters:  03/01/22 '4\' 11"'  (1.499 m)    Weight:   Wt Readings from Last 1 Encounters:  03/16/22 91 kg    Ideal Body Weight:  44.3 kg  BMI:  Body mass index is 40.52 kg/m.  Estimated Nutritional Needs:   Kcal:  1800-2100kcal/day  Protein:  90-110g/d  Fluid:  1.4-1.6L/day  CKoleen DistanceMS, RD, LDN Please refer to [facility] and/or RD on-call/weekend/after hours pager

## 2022-03-17 NOTE — Progress Notes (Addendum)
Peripherally Inserted Central Catheter Placement  The IV Nurse has discussed with the patient and/or persons authorized to consent for the patient, the purpose of this procedure and the potential benefits and risks involved with this procedure.  The benefits include less needle sticks, lab draws from the catheter, and the patient may be discharged home with the catheter. Risks include, but not limited to, infection, bleeding, blood clot (thrombus formation), and puncture of an artery; nerve damage and irregular heartbeat and possibility to perform a PICC exchange if needed/ordered by physician.  Alternatives to this procedure were also discussed.  Bard Power PICC patient education guide, fact sheet on infection prevention and patient information card has been provided to patient /or left at bedside.    PICC Placement Documentation  PICC Triple Lumen 03/17/22 Right Brachial 37 cm 0 cm (Active)  Indication for Insertion or Continuance of Line Administration of hyperosmolar/irritating solutions (i.e. TPN, Vancomycin, etc.);Limited venous access - need for IV therapy >5 days (PICC only) 03/17/22 1753  Exposed Catheter (cm) 0 cm 03/17/22 1753  Site Assessment Clean, Dry, Intact 03/17/22 1753  Lumen #1 Status Flushed;Saline locked;Blood return noted 03/17/22 1753  Lumen #2 Status Flushed;Saline locked;Blood return noted 03/17/22 1753  Lumen #3 Status Flushed;Saline locked;Blood return noted 03/17/22 1753  Dressing Type Transparent;Securing device 03/17/22 1753  Dressing Status Antimicrobial disc in place;Clean, Dry, Intact 03/17/22 1753  Line Adjustment (NICU/IV Team Only) No 03/17/22 1753  Dressing Intervention New dressing;Other (Comment) 03/17/22 1753  Dressing Change Due 03/24/22 03/17/22 1753    RFA swollen and red prior to insertion.    Enos Fling 03/17/2022, 5:55 PM

## 2022-03-17 NOTE — Progress Notes (Signed)
PHARMACY - TOTAL PARENTERAL NUTRITION CONSULT NOTE   Indication: Prolonged ileus  Patient Measurements: Height: '4\' 11"'$  (149.9 cm) Weight: 91 kg (200 lb 9.9 oz) IBW/kg (Calculated) : 43.2 TPN AdjBW (KG): 55.2 Body mass index is 40.52 kg/m.  Assessment:  56 y.o. female  s/p laparotomy, excision of greater omental mass, abdominal wall reconstruction with [name],appendectomy, and placement of Prevena vac.  Glucose / Insulin: no insulin requirements prior to TPN BG 126-170 --insulin requirements previous 24h: 7 units novolog SSI (mod TIDw/meals) and 30 units of regular insulin in TPN bag   *SSI ordered 3 times daily w/ meals, CBG ordered q6h   Electrolytes: WNL Renal: Scr<1 Hepatic: LFTs wnl Intake / Output; MIVF: (+5.5L this admit) / stopped NS'@75ml'$ /h 03/17/22 GI Imaging: GI Surgeries / Procedures: s/p laparotomy, excision of greater omental mass, abdominal wall reconstruction with [name],appendectomy, and placement of Prevena vac  Central access: 02/15/22 TPN start date: 02/15/22  Nutritional Goals: Goal TPN rate of 65 ml/h (provides 106 g of protein and 1872 kcals per day)  RD Assessment:  Estimated Needs Total Energy Estimated Needs: 1800-2100kcal/day Total Protein Estimated Needs: 90-110g/d Total Fluid Estimated Needs: 1.4-1.6L/day  Current Nutrition:  7/5 >> continued FLD + nutritional supplementation  Plan 03/17/22:  Continue TPN at 31m/hr(new goal rate) Nutritional Components Amino acids (using 15% Clinisol): 106 grams Dextrose: 265.2 grams Lipids (using 20% SMOFlipids): 54.6 grams kCal: 1872/24h  Electrolytes in TPN (standard): Na 534m/L, K 7554mL, Ca 5mE44m, Mg 10mE23m and Phos 25 mmol/L. Cl:Ac 1:1 Add trace elements to TPN Continue insulin 30 units in TPN, may need to increase if BG increases, (last dose of Glargine was 7/2 am) SSI three times daily with meals (CBG q6h) Monitor TPN labs on Mon/Thurs  CarisDorothe PeaarmD, BCPS Clinical Pharmacist   03/17/2022 9:58 AM

## 2022-03-17 NOTE — Plan of Care (Signed)
T AAOx4, no pain. VS are stable. Plan for mobility as tolerated. Bed in lowest position, call light within reach. Will continue to monitor.

## 2022-03-17 NOTE — Progress Notes (Signed)
Occupational Therapy Treatment Patient Details Name: Debra [name] MRN: [id number] DOB: [birth year] Today's Date: 03/17/2022   History of present illness Pt is a 56 y.o. female with PMH including hypothyroidism, iron deficient anemia, colon cancer.  Here s/p mass removal with hernia repair and appendectomy 6/8, bowel perforation repair 6/10.   OT comments  Pt seen for OT treatment on this date. Upon arrival to room pt awake and alert, sitting upright in bed. Video spanish interpreter used throughout tx session. Pt reported feeling fatigue 2/2 a new eakin bag change. Pt agreeable to tx focused on seated grooming tasks and ADL t/f. Pt requires MIN A with HOB elevated for supine<>sit - pt was able to use B LE to scoot towards HOB. Upon sitting, pt required prolonged seated rest break 2/2 fatigue. SUPERVISION for STS and CGA with min vcs for step seqencing and safe hand placement for BSC t/f. MIN A for pericare from in the back in standing. SUPERVISION/SETUP for grooming tasks while seated. Pt left in bed with all needs met and family present. Pt making good progress toward goals. Pt continues to benefit from skilled OT services to maximize return to PLOF and minimize risk of future falls, injury. Will continue to follow POC. Discharge recommendation remains appropriate.     Recommendations for follow up therapy are one component of a multi-disciplinary discharge planning process, led by the attending physician.  Recommendations may be updated based on patient status, additional functional criteria and insurance authorization.    Follow Up Recommendations  Home health OT    Assistance Recommended at Discharge Frequent or constant Supervision/Assistance  Patient can return home with the following  A lot of help with walking and/or transfers;A lot of help with bathing/dressing/bathroom;Assistance with cooking/housework;Assist for transportation   Equipment Recommendations  BSC/3in1     Recommendations for Other Services      Precautions / Restrictions Precautions Precautions: Fall Precaution Comments: multiple drains Restrictions Weight Bearing Restrictions: No Other Position/Activity Restrictions: Abdominal surgery       Mobility Bed Mobility Overal bed mobility: Needs Assistance Bed Mobility: Supine to Sit, Sit to Supine     Supine to sit: Min assist, HOB elevated Sit to supine: Min assist, HOB elevated   General bed mobility comments: Pt required assistance with trunk to sit EOB and assistance with B LE for sit>supine. Pt able to use B LE to scoot up in bed.    Transfers Overall transfer level: Needs assistance Equipment used: None Transfers: Sit to/from Stand, Bed to chair/wheelchair/BSC Sit to Stand: Supervision     Step pivot transfers: Min guard           Balance Overall balance assessment: Needs assistance Sitting-balance support: Feet supported Sitting balance-Leahy Scale: Good     Standing balance support: During functional activity, No upper extremity supported Standing balance-Leahy Scale: Fair                             ADL either performed or assessed with clinical judgement   ADL Overall ADL's : Needs assistance/impaired                                       General ADL Comments: CGA with min vcs for step seqencing and safe hand placement for BSC t/f. MIN A for pericare from in the back in standing. SUPERVISION/SETUP for grooming tasks  while seated.      Cognition Arousal/Alertness: Awake/alert Behavior During Therapy: WFL for tasks assessed/performed, Flat affect Overall Cognitive Status: Within Functional Limits for tasks assessed                                                     Pertinent Vitals/ Pain       Pain Assessment Pain Assessment: No/denies pain   Frequency  Min 2X/week        Progress Toward Goals  OT Goals(current goals can now be found in  the care plan section)  Progress towards OT goals: Progressing toward goals  Acute Rehab OT Goals Patient Stated Goal: to get stronger OT Goal Formulation: With patient Time For Goal Achievement: 04/10/22 Potential to Achieve Goals: Good ADL Goals Pt Will Perform Grooming: standing;with modified independence Pt Will Perform Lower Body Dressing: with adaptive equipment;sit to/from stand;with modified independence Pt Will Transfer to Toilet: ambulating;regular height toilet;with modified independence Pt Will Perform Toileting - Clothing Manipulation and hygiene: with adaptive equipment;sit to/from stand;with modified independence  Plan Discharge plan remains appropriate;Frequency remains appropriate       AM-PAC OT "6 Clicks" Daily Activity     Outcome Measure   Help from another person eating meals?: None Help from another person taking care of personal grooming?: A Little Help from another person toileting, which includes using toliet, bedpan, or urinal?: A Little Help from another person bathing (including washing, rinsing, drying)?: A Little Help from another person to put on and taking off regular upper body clothing?: A Little Help from another person to put on and taking off regular lower body clothing?: A Little 6 Click Score: 19    End of Session Equipment Utilized During Treatment: Other (comment) (none)  OT Visit Diagnosis: Unsteadiness on feet (R26.81)   Activity Tolerance Patient tolerated treatment well;Patient limited by fatigue   Patient Left in bed;with call bell/phone within reach;with bed alarm set;with family/visitor present   Nurse Communication          Time: 1424-1500 OT Time Calculation (min): 36 min  Charges: OT General Charges $OT Visit: 1 Visit OT Treatments $Self Care/Home Management : 23-37 mins  [employer], OTDS  [employer] 03/17/2022, 4:06 PM

## 2022-03-17 NOTE — Progress Notes (Signed)
Debra [name] Hospital Day(s): 32.   Post op day(s): 30 Days Post-Op.   Interval History:  Patient seen and examined No fevers Restarted on Zosyn 06/27 by ID; plan for 2 weeks No abdominal pain No significant electrolyte derangements  Surgical Drains:   - Right Drain (Intraperitoneal); no output [name] Pouch output 900ccs in last 24 hours She is on FLD; on TPN Working with therapies; recommending SNF  Pathology reviewed:  -- Metastatic adenocarcinoma; colon origin -- Plan for output follow up with oncology (Dr Tasia Catchings)  Vital signs in last 24 hours: [min-max] current  Temp:  [98.2 F (36.8 C)-100 F (37.8 C)] 98.6 F (37 C) (07/10 0746) Pulse Rate:  [65-81] 69 (07/10 0746) Resp:  [16-18] 18 (07/10 0746) BP: (94-114)/(54-60) 94/54 (07/10 0746) SpO2:  [96 %-99 %] 96 % (07/10 0746)     Height: '4\' 11"'$  (149.9 cm) Weight: 91 kg BMI (Calculated): 40.5   Intake/Output last 2 shifts:  07/09 0701 - 07/10 0700 In: 736 [I.V.:586; IV Piggyback:150.1] Out: 900 [Drains:900]   Physical Exam:  Constitutional: alert, cooperative and no distress  Respiratory: breathing non-labored at rest  Cardiovascular: regular rate and sinus rhythm  Gastrointestinal: Soft, abdominal soreness on the right, non-distended, no rebound/guarding. Surgical drain in RLQ drain; removed  Integumentary: See Below  Midline Wound (03/17/2022) - Free floating mesh excised    Labs:     Latest Ref Rng & Units 03/12/2022   10:54 AM 03/11/2022    4:55 AM 03/08/2022    2:35 PM  CBC  WBC 4.0 - 10.5 K/uL 5.5  5.7  5.5   Hemoglobin 12.0 - 15.0 g/dL 8.9  7.5  9.8   Hematocrit 36.0 - 46.0 % 28.4  24.0  31.2   Platelets 150 - 400 K/uL 166  184  233       Latest Ref Rng & Units 03/14/2022    4:57 AM 03/12/2022   10:54 AM 03/11/2022    4:55 AM  CMP  Glucose 70 - 99 mg/dL 170  162  172   BUN 6 - 20 mg/dL '18  17  20   '$ Creatinine 0.44 - 1.00 mg/dL 0.49  0.48  0.57   Sodium 135 - 145  mmol/L 137  135  136   Potassium 3.5 - 5.1 mmol/L 3.7  3.8  3.7   Chloride 98 - 111 mmol/L 112  108  108   CO2 22 - 32 mmol/L '21  22  21   '$ Calcium 8.9 - 10.3 mg/dL 7.4  7.6  7.5      Imaging studies: No new pertinent imaging studies    Assessment/Plan: 56 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   - Wound Care: [name] pouch in place with red rubber present to manage drainage. Anticipate fluid will pool in the wound bed while she is in bed. This has become semi-solid stool. It is okay to adjust the red rubber catheter from time to time to help with drainage while in bed but she remain in dependent portion of [name]. This can be removed from suction to ambulate and work with therapies. These pouches need changed every 7-10 days (ideally). We changed this today (07/10) and removed free floating mesh and remaining drain; see above picture  -  Revert to CLD in effort to thin up effluent + nutritional supplementation - Continue TPN at goal; monitor electrolytes - Continue octreotide; 200 mcg/min TID, continue imodium   - ID on board; restarted on Zosyn 06/27; plan for 2 weeks  - Monitor abdominal examination; on-going bowel function  - Pain control prn; antemetic prn  - Out of bed; therapies on board; recommendations are SNF   - Discharge Planning: Anticipate lengthy admission   All of the above findings and recommendations were discussed with the patient, and the medical team, and all of patient's questions were answered to her expressed satisfaction.  -- [name], PA-C [facility] 03/17/2022, 12:12 PM M-F: 7am - 4pm

## 2022-03-18 LAB — GLUCOSE, CAPILLARY
Glucose-Capillary: 149 mg/dL — ABNORMAL HIGH (ref 70–99)
Glucose-Capillary: 129 mg/dL — ABNORMAL HIGH (ref 70–99)
Glucose-Capillary: 152 mg/dL — ABNORMAL HIGH (ref 70–99)

## 2022-03-18 MED ORDER — TRACE MINERALS CU-MN-SE-ZN 300-55-60-3000 MCG/ML IV SOLN
INTRAVENOUS | Status: AC
  Filled 2022-03-18: qty 707.2

## 2022-03-18 NOTE — Progress Notes (Addendum)
[place] Hospital Day(s): [place] op day(s): 31 Days Post-Op.   Interval History:  Patient seen and examined No new events overnight  No abdominal pain; nausea, emesis, fevers No new labs this morning  [name] Pouch output not recorded in last 24 hours She is on CLD; on TPN Working with therapies; recommending SNF  Pathology reviewed:  -- Metastatic adenocarcinoma; colon origin -- Plan for output follow up with oncology (Dr Tasia Catchings)  Vital signs in last 24 hours: [min-max] current  Temp:  [98.2 F (36.8 C)-99.8 F (37.7 C)] 98.9 F (37.2 C) (07/11 0729) Pulse Rate:  [70-91] 80 (07/11 0729) Resp:  [16-20] 18 (07/11 0729) BP: (97-125)/(58-71) 102/58 (07/11 0729) SpO2:  [91 %-100 %] 93 % (07/11 0729) Weight:  [91.1 kg] 91.1 kg (07/11 0500)     Height: '4\' 11"'$  (149.9 cm) Weight: 91.1 kg BMI (Calculated): 40.54   Intake/Output last 2 shifts:  07/10 0701 - 07/11 0700 In: 636.9 [I.V.:636.9] Out: 0    Physical Exam:  Constitutional: alert, cooperative and no distress  Respiratory: breathing non-labored at rest  Cardiovascular: regular rate and sinus rhythm  Gastrointestinal: Soft, abdominal soreness on the right, non-distended, no rebound/guarding. Surgical drain in RLQ drain; removed  Integumentary: Midline wound open, peritoneum closed with fistulous connection on left lateral aspect and possible in RLQ. Mesh excised. There is feculent drainage present. Red rubbed in better position. [name] pouch without leak.   Labs:     Latest Ref Rng & Units 03/12/2022   10:54 AM 03/11/2022    4:55 AM 03/08/2022    2:35 PM  CBC  WBC 4.0 - 10.5 K/uL 5.5  5.7  5.5   Hemoglobin 12.0 - 15.0 g/dL 8.9  7.5  9.8   Hematocrit 36.0 - 46.0 % 28.4  24.0  31.2   Platelets 150 - 400 K/uL 166  184  233       Latest Ref Rng & Units 03/14/2022    4:57 AM 03/12/2022   10:54 AM 03/11/2022    4:55 AM  CMP  Glucose 70 - 99 mg/dL 170  162  172   BUN 6 - 20 mg/dL '18  17   20   '$ Creatinine 0.44 - 1.00 mg/dL 0.49  0.48  0.57   Sodium 135 - 145 mmol/L 137  135  136   Potassium 3.5 - 5.1 mmol/L 3.7  3.8  3.7   Chloride 98 - 111 mmol/L 112  108  108   CO2 22 - 32 mmol/L '21  22  21   '$ Calcium 8.9 - 10.3 mg/dL 7.4  7.6  7.5      Imaging studies: No new pertinent imaging studies    Assessment/Plan: 56 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   - Wound Care: [name] pouch in place with red rubber present to manage drainage. Anticipate fluid will pool in the wound bed while she is in bed. This has become semi-solid stool. It is okay to adjust the red rubber catheter from time to time to help with drainage while in bed but she remain in dependent portion of [name]. This can be removed from suction to ambulate and work with therapies. These pouches need changed every 7-10 days (ideally). We changed this yesterday (  07/10).  - Continue CLD in effort to thin up effluent + nutritional supplementation - Continue TPN at goal; monitor electrolytes - Continue octreotide; now on infusion, continue imodium   - ID on board; complete 2 weeks of Zosyn   - Monitor abdominal examination; on-going bowel function  - Pain control prn; antemetic prn  - Out of bed; therapies on board; recommendations are SNF   - Discharge Planning: Anticipate lengthy admission and potential eventual transfer   All of the above findings and recommendations were discussed with the patient, and the medical team, and all of patient's questions were answered to her expressed satisfaction.  -- [name], PA-C [facility] 03/18/2022, 9:05 AM M-F: 7am - 4pm

## 2022-03-18 NOTE — Progress Notes (Signed)
Physical Therapy Treatment Patient Details Name: Debra [name] MRN: [id number] DOB: 30-Jul-1966 Today's Date: 03/18/2022   History of Present Illness Pt is a 56 y.o. female with PMH including hypothyroidism, iron deficient anemia, colon cancer.  Here s/p mass removal with hernia repair and appendectomy 6/8, bowel perforation repair 6/10. [name] pouch was initially placed 6/20 and changed on 7/10.    PT Comments    Pt agreeable to PT this date nodding that she would like to ambulate in the hallway this morning. Spanish interpreter via iPad utilized during today's session Hall Busing [phone]). Pt required Min A to transfer from supine to sitting EOB with truncal support and L LE management. Pt performs sit-to-stand with Supervision and RW. Pt completed marching in standing prior to ambulating in room forward/backward 2x4 steps ea with CGA and RW. Nursing arrived to disconnect lines and manage drainage utilizing a leg cath bag for ambulation in the hallway. Pt ambulated 60 feet with CGA, RW, and wheelchair follow. Pt then sat in wheelchair and was transported outside in front of the building for sunlight due to prolonged hospital stay (unbillable time). Pt was left outside with nurse present. Continue to recommend STR at discharge. Plan of care reviewed and remains appropriate. Goals reviewed and updated as appropriate.   Recommendations for follow up therapy are one component of a multi-disciplinary discharge planning process, led by the attending physician.  Recommendations may be updated based on patient status, additional functional criteria and insurance authorization.  Follow Up Recommendations  Skilled nursing-short term rehab (<3 hours/day) Can patient physically be transported by private vehicle: Yes   Assistance Recommended at Discharge Frequent or constant Supervision/Assistance  Patient can return home with the following A little help with walking and/or transfers;A little help with  bathing/dressing/bathroom;Assistance with cooking/housework;Assist for transportation;Help with stairs or ramp for entrance   Equipment Recommendations  Rolling walker (2 wheels);Hospital bed;Wheelchair cushion (measurements PT);Wheelchair (measurements PT);BSC/3in1    Recommendations for Other Services       Precautions / Restrictions Precautions Precautions: Fall Precaution Comments: PICC line R UE, multiple drains Restrictions Weight Bearing Restrictions: No Other Position/Activity Restrictions: Abdominal surgery     Mobility  Bed Mobility Overal bed mobility: Needs Assistance Bed Mobility: Supine to Sit     Supine to sit: Min assist, HOB elevated     General bed mobility comments: Pt required assistance with trunk and L LE to sit EOB    Transfers Overall transfer level: Needs assistance Equipment used: Rolling walker (2 wheels) Transfers: Sit to/from Stand Sit to Stand: Supervision           General transfer comment: Pt transfers with Supervision and increased time    Ambulation/Gait Ambulation/Gait assistance: Min guard Gait Distance (Feet): 60 Feet Assistive device: Rolling walker (2 wheels) Gait Pattern/deviations: Step-to pattern, Decreased step length - right, Decreased step length - left, Decreased stride length, Wide base of support Gait velocity: decreased     General Gait Details: Short, slow steps. Pt ambulated from bedside out in hallway with nurse present to manage drainage by unplugging tubing from wall and utilizing a leg cath bag for hallway mobility.   Stairs             Wheelchair Mobility    Modified Rankin (Stroke Patients Only)       Balance Overall balance assessment: Needs assistance Sitting-balance support: Feet supported Sitting balance-Leahy Scale: Good Sitting balance - Comments: Pt sits EOB without UE support, no LOB   Standing balance support: Bilateral  upper extremity supported Standing balance-Leahy Scale:  Fair Standing balance comment: Pt ambulated with bilat UE support on RW without LOB                            Cognition Arousal/Alertness: Awake/alert Behavior During Therapy: WFL for tasks assessed/performed, Flat affect Overall Cognitive Status: Within Functional Limits for tasks assessed                                 General Comments: Utilized interpreter on iPad Hall Busing [phone]).        Exercises Other Exercises Other Exercises: pt stood at bedside and completed x10 ea marching in place with bilat UE support on RW Other Exercises: pt took 4 steps forward from bed to bathroom door and 4 steps backward, x2 reps prior to nursing disconnecting lines/tubes for ambulation in hallway Other Exercises: Unbillable time: Following ambulation, pt was seated in wheelchair and wheeled outside to the front of building for sunlight, as pt has been in her room since admission. Nursing present. Pt left with nurse outside at end of session.    General Comments        Pertinent Vitals/Pain Pain Assessment Pain Assessment: Faces Faces Pain Scale: Hurts a little bit Pain Location: abdomen when walking Pain Descriptors / Indicators: Grimacing, Discomfort Pain Intervention(s): Limited activity within patient's tolerance, Monitored during session, Premedicated before session, Repositioned    Home Living                          Prior Function            PT Goals (current goals can now be found in the care plan section) Acute Rehab PT Goals Patient Stated Goal: to go home PT Goal Formulation: With patient Time For Goal Achievement: 03/21/22 Potential to Achieve Goals: Fair Progress towards PT goals: Progressing toward goals    Frequency    Min 2X/week      PT Plan Current plan remains appropriate    Co-evaluation              AM-PAC PT "6 Clicks" Mobility   Outcome Measure  Help needed turning from your back to your side while in a  flat bed without using bedrails?: A Little Help needed moving from lying on your back to sitting on the side of a flat bed without using bedrails?: A Little Help needed moving to and from a bed to a chair (including a wheelchair)?: A Little Help needed standing up from a chair using your arms (e.g., wheelchair or bedside chair)?: A Little Help needed to walk in hospital room?: A Little Help needed climbing 3-5 steps with a railing? : A Lot 6 Click Score: 17    End of Session Equipment Utilized During Treatment: Gait belt Activity Tolerance: Patient tolerated treatment well Patient left: Other (comment) (outside seated in wheelchair with nurse present) Nurse Communication: Mobility status PT Visit Diagnosis: Muscle weakness (generalized) (M62.81);Difficulty in walking, not elsewhere classified (R26.2)     Time: 1056-1130 PT Time Calculation (min) (ACUTE ONLY): 34 min  Charges:                        Rella Larve, SPT  Hilding Quintanar 03/18/2022, 12:48 PM

## 2022-03-18 NOTE — Progress Notes (Signed)
PHARMACY - TOTAL PARENTERAL NUTRITION CONSULT NOTE   Indication: Prolonged ileus  Patient Measurements: Height: '4\' 11"'$  (149.9 cm) Weight: 91.1 kg (200 lb 13.4 oz) IBW/kg (Calculated) : 43.2 TPN AdjBW (KG): 55.2 Body mass index is 40.56 kg/m.  Assessment:  56 y.o. female  s/p laparotomy, excision of greater omental mass, abdominal wall reconstruction with [name],appendectomy, and placement of Prevena vac.  Glucose / Insulin: no insulin requirements prior to TPN BG 126-149 --insulin requirements previous 24h: 6 units novolog SSI (mod TIDw/meals) and 30 units of regular insulin in TPN bag   *SSI ordered 3 times daily w/ meals, CBG ordered q6h   Electrolytes: WNL Renal: Scr<1 Hepatic: LFTs wnl Intake / Output; MIVF: (+5.5L this admit) / stopped NS'@75ml'$ /h 03/17/22 GI Imaging: GI Surgeries / Procedures: s/p laparotomy, excision of greater omental mass, abdominal wall reconstruction with [name],appendectomy, and placement of Prevena vac  Central access: 02/15/22 TPN start date: 02/15/22  Nutritional Goals: Goal TPN rate of 65 ml/h (provides 106 g of protein and 1872 kcals per day)  RD Assessment:  Estimated Needs Total Energy Estimated Needs: 1800-2100kcal/day Total Protein Estimated Needs: 90-110g/d Total Fluid Estimated Needs: 1.4-1.6L/day  Current Nutrition:  7/5 >> continued FLD + nutritional supplementation  Plan 03/17/22:  Continue TPN at 69m/hr(new goal rate) Nutritional Components Amino acids (using 15% Clinisol): 106 grams Dextrose: 265.2 grams Lipids (using 20% SMOFlipids): 54.6 grams kCal: 1872/24h  Electrolytes in TPN (standard): Na 564m/L, K 753mL, Ca 5mE24m, Mg 10mE26m and Phos 25 mmol/L. Cl:Ac 1:1 Add trace elements to TPN Continue insulin 30 units in TPN, may need to increase if BG increases, (last dose of Glargine was 7/2 am) SSI three times daily with meals (CBG q6h) Monitor TPN labs on Mon/Thurs  WalidPearla DubonnetarmD Clinical Pharmacist   03/18/2022 7:37 AM

## 2022-03-18 NOTE — Consult Note (Addendum)
Onward Nurse wound follow up Large [name] pouch is intact with good seal, mod amt brown liquid from bedside drainage cannister which is attached to a red rubber catheter inside the pouch. Refer to previous surgical team notes; they changed the [name] pouch yesterday and assessed the wound and fistula site. Supplies at bedside for bedside nurses or surgical team to change PRN if leakage occurs. [place] team will plan to change again next week. Thank-you,  Julien Girt MSN, [place], [place], [place], [place]

## 2022-03-18 NOTE — Plan of Care (Signed)
Pt AAOx4, no pain. VS are stable. No changes overnight. Plan to ambulate and pain control. Bed is in lowest position, call light within reach, Will continue to monitor.

## 2022-03-19 LAB — GLUCOSE, CAPILLARY
Glucose-Capillary: 158 mg/dL — ABNORMAL HIGH (ref 70–99)
Glucose-Capillary: 153 mg/dL — ABNORMAL HIGH (ref 70–99)
Glucose-Capillary: 151 mg/dL — ABNORMAL HIGH (ref 70–99)
Glucose-Capillary: 139 mg/dL — ABNORMAL HIGH (ref 70–99)

## 2022-03-19 MED ORDER — TRACE MINERALS CU-MN-SE-ZN 300-55-60-3000 MCG/ML IV SOLN
INTRAVENOUS | Status: AC
  Filled 2022-03-19: qty 707.2

## 2022-03-19 NOTE — Progress Notes (Signed)
[facility] Day(s): [place] op day(s): 32 Days Post-Op.   Interval History:  Patient seen and examined Some issues with leaking from the inferior portion this morning No abdominal pain; nausea, emesis, fevers No new labs this morning [name] Pouch output not recorded in last 24 hours She is on CLD; on TPN Working with therapies; recommending SNF  Pathology reviewed:  -- Metastatic adenocarcinoma; colon origin -- Plan for output follow up with oncology (Dr Tasia Catchings)  Vital signs in last 24 hours: [min-max] current  Temp:  [98.9 F (37.2 C)-99.4 F (37.4 C)] 99.4 F (37.4 C) (07/12 0348) Pulse Rate:  [80-82] 80 (07/12 0348) Resp:  [16-20] 18 (07/12 0348) BP: (100-110)/(58-61) 110/59 (07/12 0348) SpO2:  [93 %-100 %] 94 % (07/12 0348)     Height: '4\' 11"'$  (149.9 cm) Weight: 91.1 kg BMI (Calculated): 40.54   Intake/Output last 2 shifts:  07/11 0701 - 07/12 0700 In: 862 [P.O.:120; I.V.:742] Out: 1000    Physical Exam:  Constitutional: alert, cooperative and no distress  Respiratory: breathing non-labored at rest  Cardiovascular: regular rate and sinus rhythm  Gastrointestinal: Soft, abdominal soreness on the right, non-distended, no rebound/guarding. Integumentary: Midline wound open, peritoneum closed with fistulous connection on left lateral aspect and possible in RLQ. Mesh excised. There is feculent drainage present. Red rubbed in better position. [name] pouch without leak.   Labs:     Latest Ref Rng & Units 03/12/2022   10:54 AM 03/11/2022    4:55 AM 03/08/2022    2:35 PM  CBC  WBC 4.0 - 10.5 K/uL 5.5  5.7  5.5   Hemoglobin 12.0 - 15.0 g/dL 8.9  7.5  9.8   Hematocrit 36.0 - 46.0 % 28.4  24.0  31.2   Platelets 150 - 400 K/uL 166  184  233       Latest Ref Rng & Units 03/14/2022    4:57 AM 03/12/2022   10:54 AM 03/11/2022    4:55 AM  CMP  Glucose 70 - 99 mg/dL 170  162  172   BUN 6 - 20 mg/dL '18  17  20   '$ Creatinine 0.44 - 1.00  mg/dL 0.49  0.48  0.57   Sodium 135 - 145 mmol/L 137  135  136   Potassium 3.5 - 5.1 mmol/L 3.7  3.8  3.7   Chloride 98 - 111 mmol/L 112  108  108   CO2 22 - 32 mmol/L '21  22  21   '$ Calcium 8.9 - 10.3 mg/dL 7.4  7.6  7.5      Imaging studies: No new pertinent imaging studies    Assessment/Plan: 56 y.o. female with high output enterocutaneous fistula 32 Days Post-Op s/p re-opening of laparotomy for repair of small bowel perforation and 14 days s/p laparotomy, excision of greater omental mass, abdominal wall reconstruction with [name],appendectomy, and placement of Prevena vac.   - Wound Care: [name] pouch in place with red rubber present to manage drainage. Anticipate fluid will pool in the wound bed while she is in bed. This has become semi-solid stool. It is okay to adjust the red rubber catheter from time to time to help with drainage while in bed but she remain in dependent portion of [name]. This can be removed from suction to ambulate and work with therapies. These pouches need changed every 7-10 days (ideally). We changed this yesterday (07/10).  - Continue CLD in effort to thin up  effluent + nutritional supplementation - Continue TPN at goal; monitor electrolytes - Continue octreotide; now on infusion, continue imodium   - ID on board; complete 2 weeks of Zosyn   - Monitor abdominal examination; on-going bowel function  - Pain control prn; antemetic prn  - Out of bed; therapies on board; recommendations are SNF   - Discharge Planning: Anticipate lengthy admission and potential eventual transfer   All of the above findings and recommendations were discussed with the patient, and the medical team, and all of patient's questions were answered to her expressed satisfaction.  -- [name], PA-C [facility] 03/19/2022, 7:22 AM M-F: 7am - 4pm

## 2022-03-19 NOTE — Progress Notes (Signed)
Physical Therapy Treatment Patient Details Name: Debra [name] MRN: [id number] DOB: 1966/05/19 Today's Date: 03/19/2022   History of Present Illness Pt is a 56 y.o. female with PMH including hypothyroidism, iron deficient anemia, colon cancer.  Here s/p mass removal with hernia repair and appendectomy 6/8, bowel perforation repair 6/10. [name] pouch was initially placed 6/20 and changed on 7/10.    PT Comments    Pt asleep at start of session, but easy to wake and agreeable to PT this date. Spanish medical interpreter via iPad utilized during session Lamar Benes [phone] --disconnected during ambulation in hallway, reconnected with [facility] #[id number]). Pt Min A for truncal support to sit EOB. Nursing present to disconnect pts lines/tubing for mobility in hallway. Pt performed sit-to-stand with RW and Mod I, ambulated into the bathroom to void with supervision, and was independent with pericare. Pt then ambulated from the bathroom into the hallway where she completed a full loop around the RN station for a total of 220 feet with RW, Supervision, and chair follow. Pt required 2 standing rest breaks during ambulation. Updating PT recommendation to [facility] PT at discharge due to pts mobility progress and family support at home.    Recommendations for follow up therapy are one component of a multi-disciplinary discharge planning process, led by the attending physician.  Recommendations may be updated based on patient status, additional functional criteria and insurance authorization.  Follow Up Recommendations  Home health PT Can patient physically be transported by private vehicle: Yes   Assistance Recommended at Discharge Intermittent Supervision/Assistance  Patient can return home with the following A little help with walking and/or transfers;A little help with bathing/dressing/bathroom;Assistance with cooking/housework;Assist for transportation;Help with stairs or ramp for entrance   Equipment  Recommendations  Rolling walker (2 wheels);Hospital bed;Wheelchair cushion (measurements PT);Wheelchair (measurements PT);BSC/3in1    Recommendations for Other Services       Precautions / Restrictions Precautions Precautions: Fall Precaution Comments: PICC line R UE, [name] pouch abdomen Restrictions Weight Bearing Restrictions: No Other Position/Activity Restrictions: Abdominal surgery     Mobility  Bed Mobility Overal bed mobility: Needs Assistance Bed Mobility: Supine to Sit     Supine to sit: Min assist, HOB elevated     General bed mobility comments: Pt required assistance with trunk to sit EOB    Transfers Overall transfer level: Needs assistance Equipment used: Rolling walker (2 wheels) Transfers: Sit to/from Stand Sit to Stand: Supervision           General transfer comment: Pt transfers with Supervision and increased time. Pt completed sit-to-stand from bed and toilet during session.    Ambulation/Gait Ambulation/Gait assistance: Supervision Gait Distance (Feet): 220 Feet Assistive device: Rolling walker (2 wheels) Gait Pattern/deviations: Step-to pattern, Decreased step length - right, Decreased step length - left, Decreased stride length, Wide base of support Gait velocity: decreased     General Gait Details: Short, slow steps. Pt ambulated from bedside into bathroom to void, and out in hallway for a complete loop around nursing station with 2 standing rest breaks. Nursing disconnected pt from all lines and managed drainage by unplugging tubing from wall and utilizing a catheter bag for hallway mobility.   Stairs             Wheelchair Mobility    Modified Rankin (Stroke Patients Only)       Balance Overall balance assessment: Needs assistance Sitting-balance support: Feet supported Sitting balance-Leahy Scale: Good Sitting balance - Comments: Pt sits EOB without UE support, no LOB  Standing balance support: Bilateral upper extremity  supported Standing balance-Leahy Scale: Fair Standing balance comment: Pt ambulated with bilat UE support on RW without LOB                            Cognition Arousal/Alertness: Awake/alert Behavior During Therapy: WFL for tasks assessed/performed, Flat affect Overall Cognitive Status: Within Functional Limits for tasks assessed                                 General Comments: Utilized tele-interpreter on iPad Lamar Benes [phone] --disconnected during ambulation, Ivonne [phone])        Exercises Other Exercises Other Exercises: Pt ambulated to bathroom to void. Independent with pericare. Supervision for transfers.    General Comments        Pertinent Vitals/Pain Pain Assessment Pain Assessment: Faces Faces Pain Scale: Hurts a little bit Pain Location: abdomen when transferring with bed mobility/sit-to-stand Pain Descriptors / Indicators: Grimacing, Discomfort Pain Intervention(s): Limited activity within patient's tolerance, Monitored during session, Repositioned    Home Living                          Prior Function            PT Goals (current goals can now be found in the care plan section) Acute Rehab PT Goals Patient Stated Goal: to go home PT Goal Formulation: With patient Time For Goal Achievement: 03/21/22 Potential to Achieve Goals: Good Progress towards PT goals: Progressing toward goals    Frequency    Min 2X/week      PT Plan Discharge plan needs to be updated    Co-evaluation              AM-PAC PT "6 Clicks" Mobility   Outcome Measure  Help needed turning from your back to your side while in a flat bed without using bedrails?: A Little Help needed moving from lying on your back to sitting on the side of a flat bed without using bedrails?: A Little Help needed moving to and from a bed to a chair (including a wheelchair)?: A Little Help needed standing up from a chair using your arms (e.g., wheelchair  or bedside chair)?: A Little Help needed to walk in hospital room?: A Little Help needed climbing 3-5 steps with a railing? : A Lot 6 Click Score: 17    End of Session Equipment Utilized During Treatment: Gait belt Activity Tolerance: Patient tolerated treatment well Patient left: in chair;with call bell/phone within reach;with nursing/sitter in room Nurse Communication: Mobility status PT Visit Diagnosis: Muscle weakness (generalized) (M62.81);Difficulty in walking, not elsewhere classified (R26.2)     Time: 1443-1540 PT Time Calculation (min) (ACUTE ONLY): 33 min  Charges:                        Rella Larve, SPT  Takeshi Teasdale 03/19/2022, 12:40 PM

## 2022-03-19 NOTE — Progress Notes (Signed)
No acute event overnight. No c/o nausea or pain. Pt able to tx self to bsc with contact guard assist multiple times.   Total OP from eakin pouch 1073m (the OP was left from AM shift)

## 2022-03-19 NOTE — Progress Notes (Signed)
PHARMACY - TOTAL PARENTERAL NUTRITION CONSULT NOTE   Indication: Prolonged ileus  Patient Measurements: Height: '4\' 11"'$  (149.9 cm) Weight: 91.1 kg (200 lb 13.4 oz) IBW/kg (Calculated) : 43.2 TPN AdjBW (KG): 55.2 Body mass index is 40.56 kg/m.  Assessment:  56 y.o. female  s/p laparotomy, excision of greater omental mass, abdominal wall reconstruction with [name],appendectomy, and placement of Prevena vac.  Glucose / Insulin: no insulin requirements prior to TPN BG 126-149 > 158 --insulin requirements previous 24h: 6 units novolog SSI (mod TIDw/meals) and 30 units of regular insulin in TPN bag   *SSI ordered 3 times daily w/ meals, CBG ordered q6h   Electrolytes: WNL Renal: Scr<1 Hepatic: LFTs wnl Intake / Output; MIVF: (+7.8L this admit) / stopped NS'@75ml'$ /h 03/17/22 GI Imaging: GI Surgeries / Procedures: s/p laparotomy, excision of greater omental mass, abdominal wall reconstruction with [name],appendectomy, and placement of Prevena vac  Central access: 02/15/22 TPN start date: 02/15/22  Nutritional Goals: Goal TPN rate of 65 ml/h (provides 106 g of protein and 1872 kcals per day)  RD Assessment:  Estimated Needs Total Energy Estimated Needs: 1800-2100kcal/day Total Protein Estimated Needs: 90-110g/d Total Fluid Estimated Needs: 1.4-1.6L/day  Current Nutrition:  7/5 >> continued FLD + nutritional supplementation  Plan 03/17/22:  Continue TPN at 18m/hr(new goal rate) Nutritional Components Amino acids (using 15% Clinisol): 106 grams Dextrose: 265.2 grams Lipids (using 20% SMOFlipids): 54.6 grams kCal: 1872/24h  Electrolytes in TPN (standard): Na 56m/L, K 754mL, Ca 5mE43m, Mg 10mE74m and Phos 25 mmol/L. Cl:Ac 1:1 Add trace elements to TPN Continue insulin 30 units in TPN, may need to increase if BG increases, (last dose of Glargine was 7/2 am) SSI three times daily with meals (CBG q6h) Monitor TPN labs on Mon/Thurs  KishaOswald HillockrmD Clinical Pharmacist   03/19/2022 9:40 AM

## 2022-03-19 NOTE — TOC Progression Note (Signed)
Transition of Care [facility]) - Progression Note    Patient Details  Name: Debra [name] MRN: [id number] Date of Birth: 1966-04-10  Transition of Care [facility]) CM/SW [place], RN Phone Number: 03/19/2022, 11:53 AM  Clinical Narrative:    TOC continues to follow the patient for DC planning and assistance, The patient remains unable to go to SNF due to no payer source with no ins,  The plan remains to go home with Home health thru charity   Expected Discharge Plan: [place] Barriers to Discharge: Continued Medical Work up  Expected Discharge Plan and Services Expected Discharge Plan: [place]                                               Social Determinants of Health (SDOH) Interventions    Readmission Risk Interventions     No data to display

## 2022-03-20 LAB — COMPREHENSIVE METABOLIC PANEL
ALT: 16 U/L (ref 0–44)
AST: 26 U/L (ref 15–41)
Albumin: 1.6 g/dL — ABNORMAL LOW (ref 3.5–5.0)
Alkaline Phosphatase: 57 U/L (ref 38–126)
Anion gap: 2 — ABNORMAL LOW (ref 5–15)
BUN: 19 mg/dL (ref 6–20)
CO2: 25 mmol/L (ref 22–32)
Calcium: 7.5 mg/dL — ABNORMAL LOW (ref 8.9–10.3)
Chloride: 108 mmol/L (ref 98–111)
Creatinine, Ser: 0.51 mg/dL (ref 0.44–1.00)
GFR, Estimated: >60 mL/min (ref >60)
Glucose, Bld: 142 mg/dL — ABNORMAL HIGH (ref 70–99)
Potassium: 4.2 mmol/L (ref 3.5–5.1)
Sodium: 135 mmol/L (ref 135–145)
Total Bilirubin: 0.5 mg/dL (ref 0.3–1.2)
Total Protein: 5.9 g/dL — ABNORMAL LOW (ref 6.5–8.1)

## 2022-03-20 LAB — PHOSPHORUS: Phosphorus: 3.2 mg/dL (ref 2.5–4.6)

## 2022-03-20 LAB — GLUCOSE, CAPILLARY
Glucose-Capillary: 140 mg/dL — ABNORMAL HIGH (ref 70–99)
Glucose-Capillary: 144 mg/dL — ABNORMAL HIGH (ref 70–99)
Glucose-Capillary: 126 mg/dL — ABNORMAL HIGH (ref 70–99)
Glucose-Capillary: 134 mg/dL — ABNORMAL HIGH (ref 70–99)

## 2022-03-20 LAB — TRIGLYCERIDES: Triglycerides: 76 mg/dL (ref <150)

## 2022-03-20 LAB — MAGNESIUM: Magnesium: 1.9 mg/dL (ref 1.7–2.4)

## 2022-03-20 MED ORDER — TRACE MINERALS CU-MN-SE-ZN 300-55-60-3000 MCG/ML IV SOLN
INTRAVENOUS | Status: AC
  Filled 2022-03-20: qty 707.2

## 2022-03-20 MED ORDER — L-GLUTAMINE ORAL POWDER
10.0000 g | PACK | Freq: Two times a day (BID) | ORAL | Status: DC
  Administered 2022-03-20 – 2022-07-22 (×228): 10 g via ORAL
  Filled 2022-03-20 (×253): qty 2

## 2022-03-20 NOTE — Progress Notes (Signed)
[facility] Hospital Day(s): [place] op day(s): 33 Days Post-Op.   Interval History:  Patient seen and examined No issues overnight No abdominal pain; nausea, emesis, fevers Nutritional labs reviewed; no major issues  [name] Pouch output not recorded in last 24 hours She is on CLD; on TPN Working with therapies; recommending [place]  Pathology reviewed:  -- Metastatic adenocarcinoma; colon origin -- Plan for output follow up with oncology (Dr Tasia Catchings)  Vital signs in last 24 hours: [min-max] current  Temp:  [98.2 F (36.8 C)-99.2 F (37.3 C)] 99.2 F (37.3 C) (07/13 0345) Pulse Rate:  [71-79] 77 (07/13 0345) Resp:  [16-20] 20 (07/13 0345) BP: (88-100)/(50-57) 91/57 (07/13 0345) SpO2:  [93 %-97 %] 93 % (07/13 0345) Weight:  [93.3 kg] 93.3 kg (07/13 0404)     Height: '4\' 11"'$  (149.9 cm) Weight: 93.3 kg BMI (Calculated): 41.52   Intake/Output last 2 shifts:  07/12 0701 - 07/13 0700 In: 1744.5 [P.O.:120; I.V.:1624.5] Out: 600 [Drains:600]   Physical Exam:  Constitutional: alert, cooperative and no distress  Respiratory: breathing non-labored at rest  Cardiovascular: regular rate and sinus rhythm  Gastrointestinal: Soft, abdominal soreness on the right, non-distended, no rebound/guarding. Integumentary: Midline wound open, peritoneum closed with fistulous connection on left lateral aspect and possible in RLQ. Mesh excised. There is feculent drainage present. Red rubbed in better position. [name] pouch without leak.   Labs:     Latest Ref Rng & Units 03/12/2022   10:54 AM 03/11/2022    4:55 AM 03/08/2022    2:35 PM  CBC  WBC 4.0 - 10.5 K/uL 5.5  5.7  5.5   Hemoglobin 12.0 - 15.0 g/dL 8.9  7.5  9.8   Hematocrit 36.0 - 46.0 % 28.4  24.0  31.2   Platelets 150 - 400 K/uL 166  184  233       Latest Ref Rng & Units 03/20/2022    5:37 AM 03/14/2022    4:57 AM 03/12/2022   10:54 AM  CMP  Glucose 70 - 99 mg/dL 142  170  162   BUN 6 - 20 mg/dL '19  18   17   '$ Creatinine 0.44 - 1.00 mg/dL 0.51  0.49  0.48   Sodium 135 - 145 mmol/L 135  137  135   Potassium 3.5 - 5.1 mmol/L 4.2  3.7  3.8   Chloride 98 - 111 mmol/L 108  112  108   CO2 22 - 32 mmol/L '25  21  22   '$ Calcium 8.9 - 10.3 mg/dL 7.5  7.4  7.6   Total Protein 6.5 - 8.1 g/dL 5.9     Total Bilirubin 0.3 - 1.2 mg/dL 0.5     Alkaline Phos 38 - 126 U/L 57     AST 15 - 41 U/L 26     ALT 0 - 44 U/L 16        Imaging studies: No new pertinent imaging studies    Assessment/Plan: 56 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   - Wound Care: [name] pouch in place with red rubber present to manage drainage. Anticipate fluid will pool in the wound bed while she is in bed. This has become semi-solid stool. It is okay to adjust the  red rubber catheter from time to time to help with drainage while in bed but she remain in dependent portion of [name]. This can be removed from suction to ambulate and work with therapies. These pouches need changed every 7-10 days (ideally). We changed this 07/10.  - Continue CLD in effort to thin up effluent + nutritional supplementation - Continue TPN at goal; monitor electrolytes - Continue octreotide; now on infusion, continue imodium   - ID on board; she has completed 2 weeks of Zosyn; no further fevers  - Monitor abdominal examination; on-going bowel function  - Pain control prn; antemetic prn  - Out of bed; therapies on board; recommendations are SNF   - Discharge Planning: Anticipate lengthy admission and potential eventual transfer   All of the above findings and recommendations were discussed with the patient, and the medical team, and all of patient's questions were answered to her expressed  satisfaction.  -- [name], PA-C [facility] 03/20/2022, 7:41 AM M-F: 7am - 4pm

## 2022-03-21 LAB — GLUCOSE, CAPILLARY
Glucose-Capillary: 151 mg/dL — ABNORMAL HIGH (ref 70–99)
Glucose-Capillary: 127 mg/dL — ABNORMAL HIGH (ref 70–99)
Glucose-Capillary: 139 mg/dL — ABNORMAL HIGH (ref 70–99)
Glucose-Capillary: 150 mg/dL — ABNORMAL HIGH (ref 70–99)

## 2022-03-21 MED ORDER — TRACE MINERALS CU-MN-SE-ZN 300-55-60-3000 MCG/ML IV SOLN
INTRAVENOUS | Status: AC
  Filled 2022-03-21: qty 707.2

## 2022-03-21 NOTE — Progress Notes (Signed)
PHARMACY - TOTAL PARENTERAL NUTRITION CONSULT NOTE   Indication: Prolonged ileus  Patient Measurements: Height: '4\' 11"'$  (149.9 cm) Weight: 93.3 kg (205 lb 11 oz) IBW/kg (Calculated) : 43.2 TPN AdjBW (KG): 55.2 Body mass index is 41.54 kg/m.  Assessment:  56 y.o. female s/p laparotomy, excision of greater omental mass, abdominal wall reconstruction with [name],appendectomy, and placement of Prevena vac.  Glucose / Insulin: No apparent history of diabetes. BG goal 140 - 180. BG range 126 - 151 preceding 24h (9u SSI required). TPN bag includes 30u of insulin Electrolytes: Within normal limits Renal: Scr < 1 Hepatic: No transaminitis. LFTs within normal limits Intake / Output; MIVF: I&O + 9L for the admission. High output fistula with [name] pouch in place. 1.6L output yesterday. GI Imaging: GI Surgeries / Procedures: s/p laparotomy, excision of greater omental mass, abdominal wall reconstruction with [name],appendectomy, and placement of Prevena vac  Central access: 02/15/22 TPN start date: 02/15/22  Nutritional Goals: Goal TPN rate of 65 ml/h (provides 106 g of protein and 1872 kcals per day)  RD Assessment:  Estimated Needs Total Energy Estimated Needs: 1800-2100kcal/day Total Protein Estimated Needs: 90-110g/d Total Fluid Estimated Needs: 1.4-1.6L/day  Current Nutrition:  CLD + nutritional supplements  Plan:  Continue TPN at 48m/hr Nutritional Components Amino acids (using 15% Clinisol): 106 grams Dextrose: 265.2 grams Lipids (using 20% SMOFlipids): 54.6 grams kCal: 1872/24h  Electrolytes in TPN (standard): Na 531m/L, K 7570mL, Ca 5mE96m, Mg 10mE75m and Phos 25 mmol/L. Cl:Ac 1:1 Enteral multivitamin. Add trace elements to TPN Continue moderate SSI TIDM + CBG 4x/day (ACHS). Continue insulin 30 units in TPN Monitor TPN labs on Mon/Thurs  Shawntia Mangal Benita Gutter/2023 10:08 AM

## 2022-03-21 NOTE — Progress Notes (Signed)
Occupational Therapy Treatment Patient Details Name: Debra [name] MRN: [id number] DOB: [birth year] Today's Date: 03/21/2022   History of present illness Pt is a 56 y.o. female with PMH including hypothyroidism, iron deficient anemia, colon cancer.  Here s/p mass removal with hernia repair and appendectomy 6/8, bowel perforation repair 6/10. [name] pouch was initially placed 6/20 and changed on 7/10.   OT comments  Upon entering the room, pt supine in bed with family present in room and daughter to interpret during session. Pt performs bed mobility with min A for trunk support secondary to increased discomfort in abdomen with bed mobility. Pt needing rest break on EOB and then standing with supervision and ambulating ~ 40' into bathroom without use of AD and supervision level. Pt with very slow deliberate gait but appears safe. Pt able to void on toilet and perform hygiene, clothing management, and transfer with supervision for safety. Pt turning to stand at sink and wash hands as PT arrives to work with pt. Pt transitions to PT session without discomfort.    Recommendations for follow up therapy are one component of a multi-disciplinary discharge planning process, led by the attending physician.  Recommendations may be updated based on patient status, additional functional criteria and insurance authorization.    Follow Up Recommendations  Home health OT    Assistance Recommended at Discharge Frequent or constant Supervision/Assistance  Patient can return home with the following  Assistance with cooking/housework;Assist for transportation;A little help with walking and/or transfers;A little help with bathing/dressing/bathroom   Equipment Recommendations  BSC/3in1       Precautions / Restrictions Precautions Precautions: Fall Precaution Comments: PICC line R UE, [name] pouch abdomen Restrictions Weight Bearing Restrictions: No Other Position/Activity Restrictions: Abdominal surgery        Mobility Bed Mobility Overal bed mobility: Needs Assistance Bed Mobility: Supine to Sit     Supine to sit: Min assist, HOB elevated     General bed mobility comments: Pt required assistance with trunk to sit EOB    Transfers Overall transfer level: Needs assistance Equipment used: None Transfers: Sit to/from Stand Sit to Stand: Supervision                 Balance Overall balance assessment: Needs assistance Sitting-balance support: Feet supported Sitting balance-Leahy Scale: Good     Standing balance support: Bilateral upper extremity supported Standing balance-Leahy Scale: Fair                             ADL either performed or assessed with clinical judgement   ADL Overall ADL's : Needs assistance/impaired     Grooming: Wash/dry hands;Standing;Supervision/safety                   Toilet Transfer: Supervision/safety;Ambulation;Regular Toilet   Toileting- Water quality scientist and Hygiene: Supervision/safety;Sit to/from stand       Functional mobility during ADLs: Supervision/safety      Extremity/Trunk Assessment Upper Extremity Assessment Upper Extremity Assessment: Overall WFL for tasks assessed;Generalized weakness   Lower Extremity Assessment Lower Extremity Assessment: Overall WFL for tasks assessed;Generalized weakness        Vision Patient Visual Report: No change from baseline            Cognition Arousal/Alertness: Awake/alert Behavior During Therapy: WFL for tasks assessed/performed, Flat affect Overall Cognitive Status: Within Functional Limits for tasks assessed  General Comments: daughter interpreting during session                   Pertinent Vitals/ Pain       Pain Assessment Pain Assessment: Faces Faces Pain Scale: Hurts a little bit Pain Location: abdomen when transferring with bed mobility/sit-to-stand Pain Descriptors / Indicators:  Grimacing, Discomfort Pain Intervention(s): Monitored during session, Repositioned, Limited activity within patient's tolerance         Frequency  Min 2X/week        Progress Toward Goals  OT Goals(current goals can now be found in the care plan section)  Progress towards OT goals: Progressing toward goals  Acute Rehab OT Goals Patient Stated Goal: to get stronger OT Goal Formulation: With patient Time For Goal Achievement: 04/10/22 Potential to Achieve Goals: Good  Plan Discharge plan remains appropriate;Frequency remains appropriate       AM-PAC OT "6 Clicks" Daily Activity     Outcome Measure   Help from another person eating meals?: None Help from another person taking care of personal grooming?: None Help from another person toileting, which includes using toliet, bedpan, or urinal?: A Little Help from another person bathing (including washing, rinsing, drying)?: A Little Help from another person to put on and taking off regular upper body clothing?: None Help from another person to put on and taking off regular lower body clothing?: A Little 6 Click Score: 21    End of Session    OT Visit Diagnosis: Unsteadiness on feet (R26.81)   Activity Tolerance Patient tolerated treatment well   Patient Left Other (comment) (transitions to PT session)   Nurse Communication Mobility status        Time: 4268-3419 OT Time Calculation (min): 20 min  Charges: OT General Charges $OT Visit: 1 Visit OT Treatments $Self Care/Home Management : 8-22 mins  Darleen Crocker, MS, OTR/L , CBIS ascom [phone]  03/21/22, 2:08 PM

## 2022-03-21 NOTE — Progress Notes (Signed)
EC fistula high output.  Continue [name] pouch TPN and octreotide She has been otherwise stable with appropriate labs appropriate hemodynamics. Tolerating clear liquid diet. FAMILY UPDATED   PE Nad Abd: soft, open wound w eakin pouch. No leaks. Some feculent and bilious output  A/P EC fistula completed a/bs Keep wound care, nutritional support and pt

## 2022-03-21 NOTE — Consult Note (Signed)
[facility] Nurse wound follow up Large [name] pouch is intact to midline abd with good seal, mod amt brown liquid from bedside drainage cannister which is attached to a red rubber catheter inside the pouch. Refer to previous surgical team notes; they changed the [name] pouch 7/10 and assessed the wound and fistula site at that time. Supplies at bedside for bedside nurses or surgical team to change PRN if leakage occurs. [place] team will plan to change again next week. Thank-you,  Julien Girt MSN, [place], [place], [place], [place]

## 2022-03-21 NOTE — Progress Notes (Signed)
Physical Therapy Treatment Patient Details Name: Debra [name] MRN: [id number] DOB: 1966/01/16 Today's Date: 03/21/2022   History of Present Illness Pt is a 56 y.o. female with PMH including hypothyroidism, iron deficient anemia, colon cancer.  Here s/p mass removal with hernia repair and appendectomy 6/8, bowel perforation repair 6/10. [name] pouch was initially placed 6/20 and changed on 7/10.    PT Comments    Pt received standing in bathroom in care of OT. Care transitioned from OT to PT. Pt ambulating from bathroom to exit room needing intermittent UE support on surfaces for balance. Encouraged to utilize IV pole for assistance. Improved upright posture and stability using IV pole. Able  to progress 200' step to to step through gait with IV pole. Pt overall slow moving but is very steady and remains motivated to improve strength and mobility. Upon return to room, noted increased WOB and decreased gait speed. SPO2 at 100% post ambulation with HR at 88 BPM. [name] pouch reconnected to suction with all needs in reach.  D/c recs remain appropriate. Remain recommending HHPT at d/c due to deconditioning from prolonged hospitalization and acute illness.    Recommendations for follow up therapy are one component of a multi-disciplinary discharge planning process, led by the attending physician.  Recommendations may be updated based on patient status, additional functional criteria and insurance authorization.  Follow Up Recommendations  Home health PT Can patient physically be transported by private vehicle: Yes   Assistance Recommended at Discharge Intermittent Supervision/Assistance  Patient can return home with the following A little help with walking and/or transfers;A little help with bathing/dressing/bathroom;Assistance with cooking/housework;Assist for transportation;Help with stairs or ramp for entrance   Equipment Recommendations  Rolling walker (2 wheels);Hospital bed;Wheelchair  cushion (measurements PT);Wheelchair (measurements PT);BSC/3in1    Recommendations for Other Services       Precautions / Restrictions Precautions Precautions: Fall Precaution Comments: PICC line R UE, [name] pouch abdomen Restrictions Weight Bearing Restrictions: No Other Position/Activity Restrictions: Abdominal surgery     Mobility  Bed Mobility               General bed mobility comments: Standing in bathroom Patient Response: Cooperative, Flat affect  Transfers                        Ambulation/Gait Ambulation/Gait assistance: Supervision Gait Distance (Feet): 200 Feet Assistive device: IV Pole Gait Pattern/deviations: Step-to pattern, Decreased step length - right, Decreased step length - left, Decreased stride length, Wide base of support       General Gait Details: maintains slow but consistent cadence with reduced UE support from RW to IV pole.   Stairs             Wheelchair Mobility    Modified Rankin (Stroke Patients Only)       Balance Overall balance assessment: Needs assistance Sitting-balance support: Feet supported Sitting balance-Leahy Scale: Good     Standing balance support: Bilateral upper extremity supported Standing balance-Leahy Scale: Fair Standing balance comment: Can stand statically without need for UE support                            Cognition Arousal/Alertness: Awake/alert   Overall Cognitive Status: Within Functional Limits for tasks assessed  General Comments: daughter interpreting during session        Exercises      General Comments        Pertinent Vitals/Pain Pain Assessment Pain Assessment: Faces Faces Pain Scale: Hurts a little bit Pain Intervention(s): Monitored during session, Repositioned, Limited activity within patient's tolerance    Home Living                          Prior Function            PT  Goals (current goals can now be found in the care plan section) Acute Rehab PT Goals Patient Stated Goal: to go home PT Goal Formulation: With patient Time For Goal Achievement: 04/04/22 Potential to Achieve Goals: Good Progress towards PT goals: Progressing toward goals    Frequency    Min 2X/week      PT Plan Current plan remains appropriate    Co-evaluation              AM-PAC PT "6 Clicks" Mobility   Outcome Measure  Help needed turning from your back to your side while in a flat bed without using bedrails?: A Little Help needed moving from lying on your back to sitting on the side of a flat bed without using bedrails?: A Little Help needed moving to and from a bed to a chair (including a wheelchair)?: A Little Help needed standing up from a chair using your arms (e.g., wheelchair or bedside chair)?: A Little Help needed to walk in hospital room?: A Little Help needed climbing 3-5 steps with a railing? : A Lot 6 Click Score: 17    End of Session Equipment Utilized During Treatment: Gait belt Activity Tolerance: Patient tolerated treatment well Patient left:  (seated EOB) Nurse Communication: Mobility status PT Visit Diagnosis: Muscle weakness (generalized) (M62.81);Difficulty in walking, not elsewhere classified (R26.2)     Time: 1025-8527 PT Time Calculation (min) (ACUTE ONLY): 14 min  Charges:  $Therapeutic Exercise: 8-22 mins                     Salem Caster. Fairly IV, PT, DPT Physical Therapist- [facility]  03/21/2022, 2:29 PM

## 2022-03-22 LAB — GLUCOSE, CAPILLARY
Glucose-Capillary: 136 mg/dL — ABNORMAL HIGH (ref 70–99)
Glucose-Capillary: 151 mg/dL — ABNORMAL HIGH (ref 70–99)
Glucose-Capillary: 130 mg/dL — ABNORMAL HIGH (ref 70–99)
Glucose-Capillary: 169 mg/dL — ABNORMAL HIGH (ref 70–99)

## 2022-03-22 MED ORDER — TRACE MINERALS CU-MN-SE-ZN 300-55-60-3000 MCG/ML IV SOLN
INTRAVENOUS | Status: AC
  Filled 2022-03-22: qty 707.2

## 2022-03-22 NOTE — Progress Notes (Signed)
[place] Hospital Day(s): [place] op day(s): 35 Days Post-Op.   Tolerating clear liquid diet. FAMILY present at bedside   Interval History:  Patient seen and examined No issues overnight No abdominal pain; nausea, emesis, fevers Nutritional labs reviewed; no major issues  [name] Pouch output not recorded in last 24 hours TPN running Working with therapies; recommending [place]  Pathology reviewed:  -- Metastatic adenocarcinoma; colon origin -- Plan for output follow up with oncology (Dr Tasia Catchings)  Vital signs in last 24 hours: [min-max] current  Temp:  [98.6 F (37 C)-99.5 F (37.5 C)] 99.1 F (37.3 C) (07/15 0518) Pulse Rate:  [71-76] 71 (07/15 0518) Resp:  [16-20] 16 (07/15 0518) BP: (91-104)/(41-58) 104/57 (07/15 0518) SpO2:  [97 %-100 %] 97 % (07/15 0518) Weight:  [91.1 kg] 91.1 kg (07/15 0401)     Height: '4\' 11"'$  (149.9 cm) Weight: 91.1 kg BMI (Calculated): 40.54   Intake/Output last 2 shifts:  07/14 0701 - 07/15 0700 In: 865.7 [P.O.:100; I.V.:765.7] Out: 1200 [Drains:450]   Physical Exam:  Nad, encouraging to see her sitting up partaking of her clear liquid tray. Abd: soft, open wound w eakin pouch. No leaks. Some feculent and bilious output  Labs:     Latest Ref Rng & Units 03/12/2022   10:54 AM 03/11/2022    4:55 AM 03/08/2022    2:35 PM  CBC  WBC 4.0 - 10.5 K/uL 5.5  5.7  5.5   Hemoglobin 12.0 - 15.0 g/dL 8.9  7.5  9.8   Hematocrit 36.0 - 46.0 % 28.4  24.0  31.2   Platelets 150 - 400 K/uL 166  184  233       Latest Ref Rng & Units 03/20/2022    5:37 AM 03/14/2022    4:57 AM 03/12/2022   10:54 AM  CMP  Glucose 70 - 99 mg/dL 142  170  162   BUN 6 - 20 mg/dL '19  18  17   '$ Creatinine 0.44 - 1.00 mg/dL 0.51  0.49  0.48   Sodium 135 - 145 mmol/L 135  137  135   Potassium 3.5 - 5.1 mmol/L 4.2  3.7  3.8   Chloride 98 - 111 mmol/L 108  112  108   CO2 22 - 32 mmol/L '25  21  22   '$ Calcium 8.9 - 10.3 mg/dL 7.5  7.4  7.6   Total  Protein 6.5 - 8.1 g/dL 5.9     Total Bilirubin 0.3 - 1.2 mg/dL 0.5     Alkaline Phos 38 - 126 U/L 57     AST 15 - 41 U/L 26     ALT 0 - 44 U/L 16        Imaging studies: No new pertinent imaging studies    Assessment/Plan: 56 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 She has been otherwise stable with appropriate labs appropriate hemodynamics.    - Continue CLD in effort to thin up effluent + nutritional supplementation - Continue TPN at goal; monitor electrolytes - Continue octreotide; now on infusion, continue imodium   - ID on board; she has completed 2 weeks of Zosyn; no further fevers  - Monitor abdominal examination; on-going bowel function  - Pain control prn; antemetic prn  -  Out of bed; therapies on board; recommendations are SNF   - Discharge Planning: Anticipate lengthy admission and potential eventual transfer   All of the above findings and recommendations were discussed with the patient, and the medical team, and all of patient's questions were answered to her expressed satisfaction.  -- Ronny Bacon, M.D., [facility]  03/22/2022 ; 2:09 PM

## 2022-03-22 NOTE — Progress Notes (Signed)
PHARMACY - TOTAL PARENTERAL NUTRITION CONSULT NOTE   Indication: Prolonged ileus  Patient Measurements: Height: '4\' 11"'$  (149.9 cm) Weight: 91.1 kg (200 lb 13.4 oz) IBW/kg (Calculated) : 43.2 TPN AdjBW (KG): 55.2 Body mass index is 40.56 kg/m.  Assessment:  56 y.o. female s/p laparotomy, excision of greater omental mass, abdominal wall reconstruction with [name],appendectomy, and placement of Prevena vac.  Glucose / Insulin: No apparent history of diabetes. BG goal 140 - 180. BG range 126 - 151 preceding 24h (9u SSI required). TPN bag includes 30u of insulin Electrolytes: Within normal limits Renal: Scr < 1 Hepatic: No transaminitis. LFTs within normal limits Intake / Output; MIVF: I&O + 9L for the admission. High output fistula with [name] pouch in place. 1.6L output yesterday. GI Imaging: GI Surgeries / Procedures: s/p laparotomy, excision of greater omental mass, abdominal wall reconstruction with [name],appendectomy, and placement of Prevena vac  Central access: 02/15/22 TPN start date: 02/15/22  Nutritional Goals: Goal TPN rate of 65 ml/h (provides 106 g of protein and 1872 kcals per day)  RD Assessment:  Estimated Needs Total Energy Estimated Needs: 1800-2100kcal/day Total Protein Estimated Needs: 90-110g/d Total Fluid Estimated Needs: 1.4-1.6L/day  Current Nutrition:  CLD + nutritional supplements  Plan:  Continue TPN at 60m/hr Nutritional Components Amino acids (using 15% Clinisol): 106 grams Dextrose: 265.2 grams Lipids (using 20% SMOFlipids): 54.6 grams kCal: 1872/24h  Electrolytes in TPN (standard): Na 530m/L, K 7531mL, Ca 5mE76m, Mg 10mE47m and Phos 25 mmol/L. Cl:Ac 1:1 Enteral multivitamin. Add trace elements to TPN Continue moderate SSI TIDM + CBG 4x/day (ACHS). Decrease insulin to 25 units in TPN as BG trend down Monitor TPN labs on Mon/Thurs  KishaOswald HillockrmD, 03/22/2022 9:48 AM

## 2022-03-23 LAB — GLUCOSE, CAPILLARY
Glucose-Capillary: 178 mg/dL — ABNORMAL HIGH (ref 70–99)
Glucose-Capillary: 147 mg/dL — ABNORMAL HIGH (ref 70–99)
Glucose-Capillary: 138 mg/dL — ABNORMAL HIGH (ref 70–99)
Glucose-Capillary: 125 mg/dL — ABNORMAL HIGH (ref 70–99)

## 2022-03-23 MED ORDER — TRACE MINERALS CU-MN-SE-ZN 300-55-60-3000 MCG/ML IV SOLN
INTRAVENOUS | Status: AC
  Filled 2022-03-23: qty 707.2

## 2022-03-23 NOTE — Progress Notes (Signed)
PHARMACY - TOTAL PARENTERAL NUTRITION CONSULT NOTE   Indication: Prolonged ileus  Patient Measurements: Height: '4\' 11"'$  (149.9 cm) Weight: 91.1 kg (200 lb 13.4 oz) IBW/kg (Calculated) : 43.2 TPN AdjBW (KG): 55.2 Body mass index is 40.56 kg/m.  Assessment:  56 y.o. female s/p laparotomy, excision of greater omental mass, abdominal wall reconstruction with [name],appendectomy, and placement of Prevena vac.  Glucose / Insulin: No apparent history of diabetes. BG goal 140 - 180. BG range 126 - 151 preceding 24h (9u SSI required). TPN bag includes 25u of insulin, but BG increased.  Electrolytes: Within normal limits Renal: Scr < 1 Hepatic: No transaminitis. LFTs within normal limits Intake / Output; MIVF: I&O + 9L for the admission. High output fistula with [name] pouch in place. 1.6L output yesterday. GI Imaging: GI Surgeries / Procedures: s/p laparotomy, excision of greater omental mass, abdominal wall reconstruction with [name],appendectomy, and placement of Prevena vac  Central access: 02/15/22 TPN start date: 02/15/22  Nutritional Goals: Goal TPN rate of 65 ml/h (provides 106 g of protein and 1872 kcals per day)  RD Assessment:  Estimated Needs Total Energy Estimated Needs: 1800-2100kcal/day Total Protein Estimated Needs: 90-110g/d Total Fluid Estimated Needs: 1.4-1.6L/day  Current Nutrition:  CLD + nutritional supplements  Plan:  Continue TPN at 80m/hr Nutritional Components Amino acids (using 15% Clinisol): 106 grams Dextrose: 265.2 grams Lipids (using 20% SMOFlipids): 54.6 grams kCal: 1872/24h  Electrolytes in TPN (standard): Na 516m/L, K 7524mL, Ca 5mE6m, Mg 10mE77m and Phos 25 mmol/L. Cl:Ac 1:1 Enteral multivitamin. Add trace elements to TPN Continue moderate SSI TIDM + CBG 4x/day (ACHS). Increased insulin back to 30 units in TPN as BG trend down Monitor TPN labs on Mon/Thurs  KishaOswald HillockrmD, 03/23/2022 8:34 AM

## 2022-03-23 NOTE — Progress Notes (Signed)
[place] Hospital Day(s): [place] op day(s): 36 Days Post-Op.   Tolerating clear liquid diet. Gentleman sleeping in recliner, present at bedside   Interval History:  Patient seen and examined No issues overnight No abdominal pain; nausea, emesis, fevers Nutritional labs reviewed; no major issues  [name] Pouch output a liter in last 24 hours TPN running Working with therapies; recommending [place]  Pathology reviewed:  -- Metastatic adenocarcinoma; colon origin -- Plan for output follow up with oncology (Dr Tasia Catchings)  Vital signs in last 24 hours: [min-max] current  Temp:  [98.2 F (36.8 C)-99.1 F (37.3 C)] 98.2 F (36.8 C) (07/16 0736) Pulse Rate:  [72-80] 72 (07/16 0736) Resp:  [16-18] 16 (07/16 0736) BP: (94-102)/(50-67) 94/56 (07/16 0736) SpO2:  [95 %-100 %] 95 % (07/16 0736)     Height: '4\' 11"'$  (149.9 cm) Weight: 91.1 kg BMI (Calculated): 40.54   Intake/Output last 2 shifts:  07/15 0701 - 07/16 0700 In: 718.1 [I.V.:718.1] Out: 1000 [Drains:1000]   Physical Exam:  Nad,  Abd: soft, open wound w eakin pouch. No leaks. Some feculent and bilious output  Labs:  Glucose appears controlled.    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 She has been otherwise stable with appropriate labs appropriate hemodynamics.    - Continue CLD in effort to thin up effluent + nutritional supplementation - Continue TPN at goal; monitor electrolytes - Continue octreotide; now on infusion, continue imodium   - ID on board; she has completed 2 weeks of Zosyn; no further fevers  - Monitor abdominal examination; on-going  bowel function  - Pain control prn; antemetic prn  - Out of bed; therapies on board; recommendations are SNF   - Discharge Planning: Anticipate lengthy admission and potential eventual transfer   All of the above findings and recommendations were discussed with the patient, and the medical team, and all of patient's questions were answered to her expressed satisfaction.  -- Ronny Bacon, M.D., [facility]  03/23/2022 ; 8:15 AM

## 2022-03-23 NOTE — Progress Notes (Signed)
Physical Therapy Treatment Patient Details Name: Debra [name] MRN: [id number] DOB: 1966/04/15 Today's Date: 03/23/2022   History of Present Illness Pt is a 56 y.o. female with PMH including hypothyroidism, iron deficient anemia, colon cancer.  Here s/p mass removal with hernia repair and appendectomy 6/8, bowel perforation repair 6/10. [name] pouch was initially placed 6/20 and changed on 7/10.    PT Comments    Pt received in supine, husband at bedside, agreeable to mobility. Pt completed bed mobility with supervision as well as sit<>stand transfers from bed and toilet. Good tolerance for gait training 270f with light support of IV pole. Will continue PT per POC until d/c home.  Recommendations for follow up therapy are one component of a multi-disciplinary discharge planning process, led by the attending physician.  Recommendations may be updated based on patient status, additional functional criteria and insurance authorization.  Follow Up Recommendations  Home health PT Can patient physically be transported by private vehicle: Yes   Assistance Recommended at Discharge Intermittent Supervision/Assistance  Patient can return home with the following A little help with walking and/or transfers;A little help with bathing/dressing/bathroom;Assistance with cooking/housework;Assist for transportation;Help with stairs or ramp for entrance   Equipment Recommendations  Rolling walker (2 wheels);Hospital bed;Wheelchair cushion (measurements PT);Wheelchair (measurements PT);BSC/3in1    Recommendations for Other Services       Precautions / Restrictions Precautions Precautions: Fall Precaution Comments: PICC line R UE, [name] pouch abdomen Restrictions Weight Bearing Restrictions: No Other Position/Activity Restrictions: Abdominal surgery     Mobility  Bed Mobility Overal bed mobility: Needs Assistance Bed Mobility: Supine to Sit Rolling: Min guard Sidelying to sit: Min assist,  HOB elevated Supine to sit: Min assist, HOB elevated Sit to supine: Min assist, HOB elevated   General bed mobility comments: Standing in bathroom    Transfers Overall transfer level: Needs assistance Equipment used: None Transfers: Sit to/from Stand Sit to Stand: Supervision   Step pivot transfers: Min guard       General transfer comment: Pt transfers with Supervision and increased time. Pt completed sit-to-stand from bed and toilet during session.    Ambulation/Gait Ambulation/Gait assistance: Supervision Gait Distance (Feet): 200 Feet Assistive device: IV Pole Gait Pattern/deviations: Step-through pattern, Decreased step length - right, Decreased step length - left, Wide base of support Gait velocity: decreased     General Gait Details: maintains slow but consistent cadence with reduced UE support from RW to IV pole.   Stairs             Wheelchair Mobility    Modified Rankin (Stroke Patients Only)       Balance Overall balance assessment: Needs assistance Sitting-balance support: Feet supported Sitting balance-Leahy Scale: Good Sitting balance - Comments: Pt sits EOB without UE support, no LOB   Standing balance support: Single extremity supported Standing balance-Leahy Scale: Fair Standing balance comment: Can stand statically without need for UE support                            Cognition Arousal/Alertness: Awake/alert Behavior During Therapy: WFL for tasks assessed/performed, Flat affect Overall Cognitive Status: Within Functional Limits for tasks assessed                                 General Comments: Husband helping with interpretation        Exercises General Exercises - Lower Extremity Ankle Circles/Pumps:  AROM, Both, 10 reps Long Arc Quad: AROM, Strengthening, Both, 10 reps, Seated    General Comments General comments (skin integrity, edema, etc.): [name] pouch tube removed from wall suction and  connected to new cath bag for gait      Pertinent Vitals/Pain Pain Assessment Pain Assessment: No/denies pain    Home Living                          Prior Function            PT Goals (current goals can now be found in the care plan section) Acute Rehab PT Goals Patient Stated Goal: to go home Progress towards PT goals: Progressing toward goals    Frequency    Min 2X/week      PT Plan Current plan remains appropriate    Co-evaluation              AM-PAC PT "6 Clicks" Mobility   Outcome Measure  Help needed turning from your back to your side while in a flat bed without using bedrails?: A Little Help needed moving from lying on your back to sitting on the side of a flat bed without using bedrails?: A Little Help needed moving to and from a bed to a chair (including a wheelchair)?: A Little Help needed standing up from a chair using your arms (e.g., wheelchair or bedside chair)?: A Little Help needed to walk in hospital room?: A Little Help needed climbing 3-5 steps with a railing? : A Lot 6 Click Score: 17    End of Session Equipment Utilized During Treatment: Other (comment) (None) Activity Tolerance: Patient tolerated treatment well Patient left: in bed;with call bell/phone within reach;with family/visitor present Nurse Communication: Mobility status PT Visit Diagnosis: Muscle weakness (generalized) (M62.81);Difficulty in walking, not elsewhere classified (R26.2)     Time: 0370-4888 PT Time Calculation (min) (ACUTE ONLY): 30 min  Charges:  $Gait Training: 8-22 mins $Therapeutic Activity: 8-22 mins                    Mikel Cella, PTA    Debra [name] 03/23/2022, 12:18 PM

## 2022-03-24 LAB — COMPREHENSIVE METABOLIC PANEL
ALT: 14 U/L (ref 0–44)
AST: 21 U/L (ref 15–41)
Albumin: 1.8 g/dL — ABNORMAL LOW (ref 3.5–5.0)
Alkaline Phosphatase: 52 U/L (ref 38–126)
Anion gap: 3 — ABNORMAL LOW (ref 5–15)
BUN: 17 mg/dL (ref 6–20)
CO2: 24 mmol/L (ref 22–32)
Calcium: 7.5 mg/dL — ABNORMAL LOW (ref 8.9–10.3)
Chloride: 104 mmol/L (ref 98–111)
Creatinine, Ser: 0.46 mg/dL (ref 0.44–1.00)
GFR, Estimated: >60 mL/min (ref >60)
Glucose, Bld: 149 mg/dL — ABNORMAL HIGH (ref 70–99)
Potassium: 4.4 mmol/L (ref 3.5–5.1)
Sodium: 131 mmol/L — ABNORMAL LOW (ref 135–145)
Total Bilirubin: 0.3 mg/dL (ref 0.3–1.2)
Total Protein: 6 g/dL — ABNORMAL LOW (ref 6.5–8.1)

## 2022-03-24 LAB — GLUCOSE, CAPILLARY
Glucose-Capillary: 155 mg/dL — ABNORMAL HIGH (ref 70–99)
Glucose-Capillary: 123 mg/dL — ABNORMAL HIGH (ref 70–99)
Glucose-Capillary: 139 mg/dL — ABNORMAL HIGH (ref 70–99)
Glucose-Capillary: 111 mg/dL — ABNORMAL HIGH (ref 70–99)

## 2022-03-24 LAB — CBC
HCT: 23.8 % — ABNORMAL LOW (ref 36.0–46.0)
Hemoglobin: 7.4 g/dL — ABNORMAL LOW (ref 12.0–15.0)
MCH: 26.5 pg (ref 26.0–34.0)
MCHC: 31.1 g/dL (ref 30.0–36.0)
MCV: 85.3 fL (ref 80.0–100.0)
Platelets: 140 10*3/uL — ABNORMAL LOW (ref 150–400)
RBC: 2.79 MIL/uL — ABNORMAL LOW (ref 3.87–5.11)
RDW: 16.4 % — ABNORMAL HIGH (ref 11.5–15.5)
WBC: 3.6 10*3/uL — ABNORMAL LOW (ref 4.0–10.5)
nRBC: 0 % (ref 0.0–0.2)

## 2022-03-24 LAB — PHOSPHORUS: Phosphorus: 2.9 mg/dL (ref 2.5–4.6)

## 2022-03-24 LAB — TRIGLYCERIDES: Triglycerides: 80 mg/dL (ref <150)

## 2022-03-24 LAB — MAGNESIUM: Magnesium: 2 mg/dL (ref 1.7–2.4)

## 2022-03-24 MED ORDER — TRACE MINERALS CU-MN-SE-ZN 300-55-60-3000 MCG/ML IV SOLN
INTRAVENOUS | Status: AC
  Filled 2022-03-24: qty 707.2

## 2022-03-24 NOTE — Progress Notes (Signed)
[place] Hospital Day(s): 75.   Post op day(s): 37 Days Post-Op.   Interval History:  Patient seen and examined No issues overnight No abdominal pain; nausea, emesis, fevers Nutritional labs reviewed; no major issues  [name] Pouch output not recorded in last 24 hours  - still leaking from inferior edge despite reinforcement effort  She is on CLD; on TPN Working with therapies; recommending [place]  Pathology reviewed:  -- Metastatic adenocarcinoma; colon origin -- Plan for output follow up with oncology (Dr Tasia Catchings)  Vital signs in last 24 hours: [min-max] current  Temp:  [98.2 F (36.8 C)-98.7 F (37.1 C)] 98.2 F (36.8 C) (07/17 0802) Pulse Rate:  [66-73] 66 (07/17 0802) Resp:  [16-20] 16 (07/17 0802) BP: (93-104)/(50-55) 96/51 (07/17 0802) SpO2:  [94 %-100 %] 98 % (07/17 0802) Weight:  [93.4 kg] 93.4 kg (07/17 0452)     Height: '4\' 11"'$  (149.9 cm) Weight: 93.4 kg BMI (Calculated): 41.57   Intake/Output last 2 shifts:  07/16 0701 - 07/17 0700 In: 1586.5 [P.O.:180; I.V.:1406.5] Out: 850 [Drains:550]   Physical Exam:  Constitutional: alert, cooperative and no distress  Respiratory: breathing non-labored at rest  Cardiovascular: regular rate and sinus rhythm  Gastrointestinal: Soft, abdominal soreness on the right, non-distended, no rebound/guarding. Integumentary: Midline wound open, peritoneum closed with fistulous connection on left lateral aspect and possible in RLQ. Mesh excised. There is feculent drainage present. Red rubbed in better position. [name] pouch without leak.   Labs:     Latest Ref Rng & Units 03/24/2022    5:03 AM 03/12/2022   10:54 AM 03/11/2022    4:55 AM  CBC  WBC 4.0 - 10.5 K/uL 3.6  5.5  5.7   Hemoglobin 12.0 - 15.0 g/dL 7.4  8.9  7.5   Hematocrit 36.0 - 46.0 % 23.8  28.4  24.0   Platelets 150 - 400 K/uL 140  166  184       Latest Ref Rng & Units 03/24/2022    5:03 AM 03/20/2022    5:37 AM 03/14/2022    4:57 AM   CMP  Glucose 70 - 99 mg/dL 149  142  170   BUN 6 - 20 mg/dL '17  19  18   '$ Creatinine 0.44 - 1.00 mg/dL 0.46  0.51  0.49   Sodium 135 - 145 mmol/L 131  135  137   Potassium 3.5 - 5.1 mmol/L 4.4  4.2  3.7   Chloride 98 - 111 mmol/L 104  108  112   CO2 22 - 32 mmol/L '24  25  21   '$ Calcium 8.9 - 10.3 mg/dL 7.5  7.5  7.4   Total Protein 6.5 - 8.1 g/dL 6.0  5.9    Total Bilirubin 0.3 - 1.2 mg/dL 0.3  0.5    Alkaline Phos 38 - 126 U/L 52  57    AST 15 - 41 U/L 21  26    ALT 0 - 44 U/L 14  16       Imaging studies: No new pertinent imaging studies    Assessment/Plan: 56 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   - Wound Care: [name] pouch in place with red rubber present to manage drainage. Anticipate fluid will pool in the wound  bed while she is in bed. This has become semi-solid stool. It is okay to adjust the red rubber catheter from time to time to help with drainage while in bed but she remain in dependent portion of [name]. This can be removed from suction to ambulate and work with therapies. These pouches need changed every 7-10 days (ideally). We changed this 07/10, will ask WOC RN to assistance in pouch change potentially today given leakig issues,   - Continue CLD in effort to thin up effluent + nutritional supplementation - Continue TPN at goal; monitor electrolytes - Continue octreotide; now on infusion, continue imodium   - ID on board; she has completed 2 weeks of Zosyn; no further fevers  - Monitor abdominal examination; on-going bowel function  - Pain control prn; antemetic prn  - Out of bed; therapies on board; recommendations are SNF   - Discharge Planning: Anticipate lengthy admission and potential eventual transfer    All of the above findings and recommendations were discussed with the patient, and the medical team, and all of patient's questions were answered to her expressed satisfaction.  -- [name], PA-C [facility] 03/24/2022, 8:02 AM M-F: 7am - 4pm

## 2022-03-24 NOTE — Progress Notes (Signed)
Occupational Therapy Treatment Patient Details Name: Debra [name] MRN: [id number] DOB: [birth year] Today's Date: 03/24/2022   History of present illness Pt is a 56 y.o. female with PMH including hypothyroidism, iron deficient anemia, colon cancer.  Here s/p mass removal with hernia repair and appendectomy 6/8, bowel perforation repair 6/10. [name] pouch was initially placed 6/20 and changed on 7/10.   OT comments  Upon entering the room, pt seated on EOB with daughter present in the room and pt finishing lunch tray. Pt's daughter reports she has been assisting pt into bathroom since last session. Plan made for hands on family training this session. RN arrived to connect eakin pouch tubing to bag and pt pushing IV pole while pt's daughter providing supervision and cuing. Pt ambulates 200' with much faster gait this session. She declines toileting and self care this session but all education as been completed and pt has been performing toileting needs without physical assistance. Pt and family member with no concerns at this time. OT to SIGN OFF with plans for daughter to call RN to connect tube to bag in order for her to assist to bathroom and ambulate daily.    Recommendations for follow up therapy are one component of a multi-disciplinary discharge planning process, led by the attending physician.  Recommendations may be updated based on patient status, additional functional criteria and insurance authorization.    Follow Up Recommendations  No OT follow up    Assistance Recommended at Discharge Frequent or constant Supervision/Assistance  Patient can return home with the following  Assistance with cooking/housework;Assist for transportation;A little help with walking and/or transfers;A little help with bathing/dressing/bathroom   Equipment Recommendations  None recommended by OT       Precautions / Restrictions Precautions Precautions: Fall Precaution Comments: PICC line R UE, [name]  pouch abdomen       Mobility Bed Mobility               General bed mobility comments: seated on EOB eating lunch when entering the room    Transfers Overall transfer level: Needs assistance Equipment used: None Transfers: Sit to/from Stand Sit to Stand: Modified independent (Device/Increase time)                 Balance Overall balance assessment: Needs assistance Sitting-balance support: Feet supported Sitting balance-Leahy Scale: Good     Standing balance support: Single extremity supported Standing balance-Leahy Scale: Fair                             ADL either performed or assessed with clinical judgement   ADL Overall ADL's : Needs assistance/impaired                                       General ADL Comments: supervision overall    Extremity/Trunk Assessment Upper Extremity Assessment Upper Extremity Assessment: Generalized weakness   Lower Extremity Assessment Lower Extremity Assessment: Generalized weakness        Vision Patient Visual Report: No change from baseline       Frequency  Min 2X/week        Progress Toward Goals  OT Goals(current goals can now be found in the care plan section)  Progress towards OT goals: Progressing toward goals  Acute Rehab OT Goals Patient Stated Goal: to get stronger OT Goal Formulation: With patient Time For  Goal Achievement: 04/10/22 Potential to Achieve Goals: Good  Plan Discharge plan remains appropriate;Frequency remains appropriate       AM-PAC OT "6 Clicks" Daily Activity     Outcome Measure   Help from another person eating meals?: None Help from another person taking care of personal grooming?: None Help from another person toileting, which includes using toliet, bedpan, or urinal?: None Help from another person bathing (including washing, rinsing, drying)?: None Help from another person to put on and taking off regular upper body clothing?:  None Help from another person to put on and taking off regular lower body clothing?: None 6 Click Score: 24    End of Session    OT Visit Diagnosis: Unsteadiness on feet (R26.81)   Activity Tolerance Patient tolerated treatment well   Patient Left in bed;with call bell/phone within reach;with family/visitor present   Nurse Communication Mobility status        Time: 0938-1829 OT Time Calculation (min): 19 min  Charges: OT General Charges $OT Visit: 1 Visit OT Treatments $Therapeutic Activity: 8-22 mins  Darleen Crocker, MS, OTR/L , CBIS ascom [phone]  03/24/22, 4:23 PM

## 2022-03-24 NOTE — Progress Notes (Signed)
Nutrition Follow-up  DOCUMENTATION CODES:   Obesity unspecified  INTERVENTION:   Continue TPN per pharmacy- provides 1872kcal/day and 106g/day protein   Boost Breeze po TID, each supplement provides 250 kcal and 9 grams of protein  Daily weights   NUTRITION DIAGNOSIS:   Increased nutrient needs related to wound healing, catabolic illness as evidenced by estimated needs.  GOAL:   Patient will meet greater than or equal to 90% of their needs -met with TPN   MONITOR:   PO intake, Supplement acceptance, Labs, Weight trends, Diet advancement, I & O's. TPN  ASSESSMENT:   56 y/o female with h/o hypothyroidism, COVID 19 (3/21), kidney stones and stage 3 colon cancer (s/p left hemicolectomy 5/21 and chemotherapy) who is admitted for new pelvic mass now s/p laparotomy 6/8 (with excision of pelvic mass from greater omentum, abdominal wall reconstruction with bilateral myocutaneous flaps and mesh, incisional hernia repair, appendectomy repair and VAC placement) complicated by bowel perforation s/p reopening of recent laparotomy 6/10 (with repair of small bowel perforation, excision of mesh, placement of two phasix mesh and VAC placement). Pathology returned as metastatic adenocarcinoma.   Pt tolerating TPN at goal rate. Refeed labs stable. Triglycerides wnl. Hyperglycemia improved with insulin in TPN. Per chart, pt appears weight stable since admission. Pt downgraded to clear liquid diet 7/10 to facilitate wound healing. Boost Breeze added by MD. Pt with poor oral intake in hospital. [name] pouch with 54m output; pouch is noted to be leaking. Per chart, pt appears weight stable since admit. Pt may require prolonged TPN per MD note.    Medications reviewed and include: lovenox, heparin, insulin, L-glutamine, synthroid, imodium, MVI, protonix, carafate  Labs reviewed: Na 131(L), K 4.4 wnl, P 2.9 wnl, Mg 2.0 wnl Triglycerides- 80 Wbc- 3.6(L), Hgb 7.4(L), Hct 23.8(L) Cbgs- 123, 155 x 24  hrs  Diet Order:   Diet Order             Diet clear liquid Room service appropriate? Yes; Fluid consistency: Thin  Diet effective now                  EDUCATION NEEDS:   Not appropriate for education at this time  Skin:  Skin Assessment: Reviewed RN Assessment (surgical abdominal wound 15.5 cm x 14.6 cm x 7 cm)  Last BM:  7/6  Height:   Ht Readings from Last 1 Encounters:  03/01/22 _0  (1.499 m)    Weight:   Wt Readings from Last 1 Encounters:  03/24/22 93.4 kg    Ideal Body Weight:  44.3 kg  BMI:  Body mass index is 41.59 kg/m.  Estimated Nutritional Needs:   Kcal:  1800-2100kcal/day  Protein:  90-110g/d  Fluid:  1.4-1.6L/day  CKoleen DistanceMS, RD, LDN Please refer to [facility] and/or RD on-call/weekend/after hours pager

## 2022-03-24 NOTE — Progress Notes (Signed)
Noted [name] pouch with small amount leakage center lower area. Suction working appropriately Defer changing at family request. Fistula drainage appears to be thicker this am.

## 2022-03-24 NOTE — Consult Note (Signed)
[place] Nurse wound follow up Wound type: surgical abdominal wound, with leaking [name] pouch Measurement: 15.5 cm x 14.6 cm x 7 cm Wound bed: red Drainage (amount, consistency, odor) liquid and pudding consistency stool Periwound: intact Dressing procedure/placement/frequency: A portion of the [name] pouch was used as a barrier at the inferior border. New pouch placed, connected to suction with a red rubber catheter.  Patient tolerated well. Patient's English/Spanish speaking daughter at bedside assisting with translation for me. Val Riles, RN, MSN, CWOCN, CNS-BC, pager [phone]

## 2022-03-24 NOTE — Progress Notes (Signed)
PHARMACY - TOTAL PARENTERAL NUTRITION CONSULT NOTE   Indication: Prolonged ileus  Patient Measurements: Height: '4\' 11"'$  (149.9 cm) Weight: 93.4 kg (205 lb 14.6 oz) IBW/kg (Calculated) : 43.2 TPN AdjBW (KG): 55.2 Body mass index is 41.59 kg/m.  Assessment:  56 y.o. female s/p laparotomy, excision of greater omental mass, abdominal wall reconstruction with [name],appendectomy, and placement of Prevena vac.  Glucose / Insulin: No apparent history of diabetes. BG goal 140 - 180. BG range 125 - 155 preceding 24h (7u SSI required). TPN bag includes 30u of insulin, but BG increased.  Electrolytes: Within normal limits Renal: Scr < 1 Hepatic: No transaminitis. LFTs within normal limits Intake / Output; MIVF: I&O + 9L for the admission. High output fistula with [name] pouch in place. 0.5L output yesterday. GI Imaging: GI Surgeries / Procedures: s/p laparotomy, excision of greater omental mass, abdominal wall reconstruction with [name],appendectomy, and placement of Prevena vac  Central access: 02/15/22 TPN start date: 02/15/22  Nutritional Goals: Goal TPN rate of 65 ml/h (provides 106 g of protein and 1872 kcals per day)  RD Assessment:  Estimated Needs Total Energy Estimated Needs: 1800-2100kcal/day Total Protein Estimated Needs: 90-110g/d Total Fluid Estimated Needs: 1.4-1.6L/day  Current Nutrition:  CLD + nutritional supplements  Plan:  Continue TPN at 58m/hr Nutritional Components Amino acids (using 15% Clinisol): 106 grams Dextrose: 265.2 grams Lipids (using 20% SMOFlipids): 54.6 grams kCal: 1872/24h  Electrolytes in TPN (standard): Na 541m/L, K 7561mL, Ca 5mE20m, Mg 10mE91m and Phos 25 mmol/L. Cl:Ac 1:1 Enteral multivitamin. Add trace elements to TPN Continue moderate SSI TIDM + CBG 4x/day (ACHS). Increased insulin back to 30 units in TPN as BG trend down Monitor TPN labs on Mon/Thurs  Macarthur Lorusso Paulina FusirmD, BCPS 03/24/2022 3:54 PM

## 2022-03-25 LAB — GLUCOSE, CAPILLARY
Glucose-Capillary: 159 mg/dL — ABNORMAL HIGH (ref 70–99)
Glucose-Capillary: 160 mg/dL — ABNORMAL HIGH (ref 70–99)
Glucose-Capillary: 118 mg/dL — ABNORMAL HIGH (ref 70–99)
Glucose-Capillary: 142 mg/dL — ABNORMAL HIGH (ref 70–99)

## 2022-03-25 MED ORDER — LOPERAMIDE HCL 2 MG PO CAPS
4.0000 mg | ORAL_CAPSULE | Freq: Four times a day (QID) | ORAL | Status: DC
  Administered 2022-03-25 – 2022-07-26 (×480): 4 mg via ORAL
  Filled 2022-03-25 (×482): qty 2

## 2022-03-25 MED ORDER — TRACE MINERALS CU-MN-SE-ZN 300-55-60-3000 MCG/ML IV SOLN
INTRAVENOUS | Status: AC
  Filled 2022-03-25: qty 707.2

## 2022-03-25 NOTE — Progress Notes (Signed)
End of shift note:  Pt received schedule medications. BG was monitored and coverage was given. Pt's Eaking pouch output was 750 ml during this shift. Pt continues on TPN at 65 ml/hr. Pt's BP is still soft but pt is asymptomatic.

## 2022-03-25 NOTE — Plan of Care (Signed)
  Problem: Clinical Measurements: Goal: Will remain free from infection Outcome: Not Progressing   Problem: Nutrition: Goal: Adequate nutrition will be maintained Outcome: Not Progressing   Problem: Elimination: Goal: Will not experience complications related to bowel motility Outcome: Not Progressing   Problem: Pain Managment: Goal: General experience of comfort will improve Outcome: Not Progressing   Problem: Nutritional: Goal: Maintenance of adequate nutrition will improve Outcome: Not Progressing

## 2022-03-25 NOTE — Progress Notes (Signed)
[place] Hospital Day(s): 40.   Post op day(s): 38 Days Post-Op.   Interval History:  Patient seen and examined No issues overnight No abdominal pain; nausea, emesis, fevers [name] Pouch output 800 ccs in last 24 hours; changed 07/17 She is on CLD; on TPN Working with therapies; recommending [facility]  Pathology reviewed:  -- Metastatic adenocarcinoma; colon origin -- Plan for output follow up with oncology (Dr Tasia Catchings)  Vital signs in last 24 hours: [min-max] current  Temp:  [98.2 F (36.8 C)-98.5 F (36.9 C)] 98.5 F (36.9 C) (07/18 0326) Pulse Rate:  [66-75] 72 (07/18 0326) Resp:  [16-18] 18 (07/18 0326) BP: (92-103)/(51-56) 93/51 (07/18 0326) SpO2:  [96 %-100 %] 96 % (07/18 0326) Weight:  [93.8 kg] 93.8 kg (07/18 0326)     Height: '4\' 11"'$  (149.9 cm) Weight: 93.8 kg BMI (Calculated): 41.74   Intake/Output last 2 shifts:  07/17 0701 - 07/18 0700 In: 1701.8 [P.O.:236; I.V.:1465.8] Out: 800 [Drains:800]   Physical Exam:  Constitutional: alert, cooperative and no distress  Respiratory: breathing non-labored at rest  Cardiovascular: regular rate and sinus rhythm  Gastrointestinal: Soft, abdominal soreness on the right, non-distended, no rebound/guarding. Integumentary: Midline wound open, peritoneum closed with fistulous connection on left lateral aspect and possible in RLQ. Mesh excised. There is feculent drainage present. Red rubbed in better position. [name] pouch without leak.   Labs:     Latest Ref Rng & Units 03/24/2022    5:03 AM 03/12/2022   10:54 AM 03/11/2022    4:55 AM  CBC  WBC 4.0 - 10.5 K/uL 3.6  5.5  5.7   Hemoglobin 12.0 - 15.0 g/dL 7.4  8.9  7.5   Hematocrit 36.0 - 46.0 % 23.8  28.4  24.0   Platelets 150 - 400 K/uL 140  166  184       Latest Ref Rng & Units 03/24/2022    5:03 AM 03/20/2022    5:37 AM 03/14/2022    4:57 AM  CMP  Glucose 70 - 99 mg/dL 149  142  170   BUN 6 - 20 mg/dL '17  19  18   '$ Creatinine 0.44 - 1.00  mg/dL 0.46  0.51  0.49   Sodium 135 - 145 mmol/L 131  135  137   Potassium 3.5 - 5.1 mmol/L 4.4  4.2  3.7   Chloride 98 - 111 mmol/L 104  108  112   CO2 22 - 32 mmol/L '24  25  21   '$ Calcium 8.9 - 10.3 mg/dL 7.5  7.5  7.4   Total Protein 6.5 - 8.1 g/dL 6.0  5.9    Total Bilirubin 0.3 - 1.2 mg/dL 0.3  0.5    Alkaline Phos 38 - 126 U/L 52  57    AST 15 - 41 U/L 21  26    ALT 0 - 44 U/L 14  16       Imaging studies: No new pertinent imaging studies    Assessment/Plan: 55 y.o. 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   - Wound Care: [name] pouch in place with red rubber present to manage drainage. Anticipate fluid will pool in the wound bed while she is in bed. This has become semi-solid stool. It is okay to adjust  the red rubber catheter from time to time to help with drainage while in bed but she remain in dependent portion of [name]. This can be removed from suction to ambulate and work with therapies. These pouches need changed every 7-10 days (ideally). Changed 07/17; next change 07/27 if no issues in interim  - Continue CLD in effort to thin up effluent + nutritional supplementation - Continue TPN at goal; monitor electrolytes - Continue octreotide; now on infusion, continue imodium   - ID on board; she has completed 2 weeks of Zosyn; no further fevers  - Monitor abdominal examination; on-going bowel function  - Pain control prn; antemetic prn  - Out of bed; therapies on board; recommendations are SNF   - Discharge Planning: Anticipate lengthy admission and potential eventual transfer   All of the above findings and recommendations were discussed with the patient, and the medical team, and all of patient's questions were answered to her  expressed satisfaction.  -- [name], PA-C [facility] 03/25/2022, 7:28 AM M-F: 7am - 4pm

## 2022-03-25 NOTE — Progress Notes (Signed)
Physical Therapy Treatment Patient Details Name: Debra [name] MRN: [id number] DOB: 1966/05/26 Today's Date: 03/25/2022   History of Present Illness Pt is a 56 y.o. female with PMH including hypothyroidism, iron deficient anemia, colon cancer.  Here s/p mass removal with hernia repair and appendectomy 6/8, bowel perforation repair 6/10. [name] pouch was initially placed 6/20 and changed on 7/10.    PT Comments    Pt walked with daughter earlier today. Continues to perform well and maintain motivation for mobility. Pt attempts to be as independent as possible and only occasionally needing help with trunk during transfers. Good tolerance for gait training with light support of RW 282f and supervision. No c/o dizziness or increased pain. Continue per POC.   Recommendations for follow up therapy are one component of a multi-disciplinary discharge planning process, led by the attending physician.  Recommendations may be updated based on patient status, additional functional criteria and insurance authorization.  Follow Up Recommendations  Home health PT Can patient physically be transported by private vehicle: Yes   Assistance Recommended at Discharge Intermittent Supervision/Assistance  Patient can return home with the following A little help with walking and/or transfers;A little help with bathing/dressing/bathroom;Assistance with cooking/housework;Assist for transportation;Help with stairs or ramp for entrance   Equipment Recommendations  Rolling walker (2 wheels);Hospital bed;Wheelchair cushion (measurements PT);Wheelchair (measurements PT);BSC/3in1    Recommendations for Other Services       Precautions / Restrictions Precautions Precautions: Fall Precaution Comments: PICC line R UE, [name] pouch abdomen Restrictions Weight Bearing Restrictions: No Other Position/Activity Restrictions: Abdominal surgery     Mobility  Bed Mobility Overal bed mobility: Needs Assistance Bed  Mobility: Sit to Supine Rolling: Min guard              Transfers Overall transfer level: Needs assistance Equipment used: None Transfers: Sit to/from Stand Sit to Stand: Modified independent (Device/Increase time)           General transfer comment: slow and steady    Ambulation/Gait Ambulation/Gait assistance: Supervision Gait Distance (Feet): 200 Feet Assistive device: Rolling walker (2 wheels) Gait Pattern/deviations: Step-through pattern, Decreased step length - right, Decreased step length - left, Wide base of support Gait velocity: decreased     General Gait Details: maintains slow but consistent cadence with reduced UE support from RW to IV pole.   Stairs             Wheelchair Mobility    Modified Rankin (Stroke Patients Only)       Balance                                            Cognition Arousal/Alertness: Awake/alert Behavior During Therapy: WFL for tasks assessed/performed, Flat affect Overall Cognitive Status: Within Functional Limits for tasks assessed                                 General Comments: Daughter helping with tinterpretation        Exercises      General Comments        Pertinent Vitals/Pain Pain Assessment Pain Assessment: Faces Faces Pain Scale: Hurts a little bit Pain Location: abdomen when transferring with bed mobility/sit-to-stand    Home Living  Prior Function            PT Goals (current goals can now be found in the care plan section) Acute Rehab PT Goals Patient Stated Goal: to go home Progress towards PT goals: Progressing toward goals    Frequency    Min 2X/week      PT Plan Current plan remains appropriate    Co-evaluation              AM-PAC PT "6 Clicks" Mobility   Outcome Measure  Help needed turning from your back to your side while in a flat bed without using bedrails?: A Little Help needed  moving from lying on your back to sitting on the side of a flat bed without using bedrails?: A Little Help needed moving to and from a bed to a chair (including a wheelchair)?: A Little Help needed standing up from a chair using your arms (e.g., wheelchair or bedside chair)?: A Little Help needed to walk in hospital room?: A Little Help needed climbing 3-5 steps with a railing? : A Lot 6 Click Score: 17    End of Session         PT Visit Diagnosis: Muscle weakness (generalized) (M62.81);Difficulty in walking, not elsewhere classified (R26.2)     Time: 6222-9798 PT Time Calculation (min) (ACUTE ONLY): 16 min  Charges:  $Gait Training: 8-22 mins                    Mikel Cella, PTA    [name] 03/25/2022, 5:12 PM

## 2022-03-25 NOTE — Progress Notes (Signed)
PHARMACY - TOTAL PARENTERAL NUTRITION CONSULT NOTE   Indication: Prolonged ileus  Patient Measurements: Height: '4\' 11"'$  (149.9 cm) Weight: 93.8 kg (206 lb 12.7 oz) IBW/kg (Calculated) : 43.2 TPN AdjBW (KG): 55.2 Body mass index is 41.77 kg/m.  Assessment:  56 y.o. female s/p laparotomy, excision of greater omental mass, abdominal wall reconstruction with [name],appendectomy, and placement of Prevena vac.  Glucose / Insulin: No apparent history of diabetes. BG goal 140 - 180. BG range 111 - 149 preceding 24h (7u SSI required). TPN bag includes 30u of insulin Electrolytes: Within normal limits Renal: Scr < 1, stable Hepatic: No transaminitis. LFTs within normal limits Intake/Output last 2 shifts:  07/17 0701 - 07/18 0700 In: 1701.8 [P.O.:236; I.V.:1465.8] Out: 800 [Drains:800]  GI Imaging: GI Surgeries / Procedures: s/p laparotomy, excision of greater omental mass, abdominal wall reconstruction with [name],appendectomy, and placement of Prevena vac  Central access: 02/15/22 TPN start date: 02/15/22  Nutritional Goals: Goal TPN rate of 65 ml/h (provides 106 g of protein and 1872 kcals per day)  RD Assessment:  Estimated Needs Total Energy Estimated Needs: 1800-2100kcal/day Total Protein Estimated Needs: 90-110g/d Total Fluid Estimated Needs: 1.4-1.6L/day  Current Nutrition:  CLD + nutritional supplements  Plan:  Continue TPN at 54m/hr Nutritional Components Amino acids (using 15% Clinisol): 106 grams Dextrose: 265.2 grams Lipids (using 20% SMOFlipids): 54.6 grams kCal: 1872/24h  Electrolytes in TPN (standard): Na 563m/L, K 7529mL, Ca 5mE73m, Mg 10mE49m and Phos 25 mmol/L. Cl:Ac 1:1 Enteral multivitamin. Add trace elements to TPN Continue moderate SSI TIDM + CBG 4x/day (ACHS).  Continue insulin at 30 units in TPN  Monitor TPN labs on Mon/Thurs  RodneVallery SarmD, BCPS 03/25/2022 9:49 AM

## 2022-03-26 LAB — GLUCOSE, CAPILLARY
Glucose-Capillary: 160 mg/dL — ABNORMAL HIGH (ref 70–99)
Glucose-Capillary: 148 mg/dL — ABNORMAL HIGH (ref 70–99)
Glucose-Capillary: 158 mg/dL — ABNORMAL HIGH (ref 70–99)
Glucose-Capillary: 156 mg/dL — ABNORMAL HIGH (ref 70–99)

## 2022-03-26 MED ORDER — TRACE MINERALS CU-MN-SE-ZN 300-55-60-3000 MCG/ML IV SOLN
INTRAVENOUS | Status: AC
  Filled 2022-03-26: qty 707.2

## 2022-03-26 NOTE — Progress Notes (Signed)
Nutrition Follow-up  DOCUMENTATION CODES:   Obesity unspecified  INTERVENTION:   Continue TPN per pharmacy- provides 1872kcal/day and 106g/day protein   Boost Breeze po TID, each supplement provides 250 kcal and 9 grams of protein  Daily weights   NUTRITION DIAGNOSIS:   Increased nutrient needs related to wound healing, catabolic illness as evidenced by estimated needs.  GOAL:   Patient will meet greater than or equal to 90% of their needs -met with TPN   MONITOR:   PO intake, Supplement acceptance, Labs, Weight trends, Diet advancement, I & O's, TPN  ASSESSMENT:   56 y/o female with h/o hypothyroidism, COVID 19 (3/21), kidney stones and stage 3 colon cancer (s/p left hemicolectomy 5/21 and chemotherapy) who is admitted for new pelvic mass now s/p laparotomy 6/8 (with excision of pelvic mass from greater omentum, abdominal wall reconstruction with bilateral myocutaneous flaps and mesh, incisional hernia repair, appendectomy repair and VAC placement) complicated by bowel perforation s/p reopening of recent laparotomy 6/10 (with repair of small bowel perforation, excision of mesh, placement of two phasix mesh and VAC placement). Pathology returned as metastatic adenocarcinoma.   Pt tolerating TPN at goal rate. Refeed labs stable. Triglycerides wnl. Hyperglycemia improved with insulin in TPN. Per chart, pt up ~5lbs since admission. [name] pouch with 819m output. Pt may require prolonged TPN per MD note.    Medications reviewed and include: lovenox, heparin, insulin, L-glutamine, synthroid, imodium, MVI, protonix, carafate  Labs reviewed: Na 131(L), K 4.4 wnl, P 2.9 wnl, Mg 2.0 wnl- 7/17 Triglycerides- 80- 7/17 Wbc- 3.6(L), Hgb 7.4(L), Hct 23.8(L)- 7/17 Cbgs- 148, 160 x 24 hrs  Diet Order:   Diet Order             Diet clear liquid Room service appropriate? Yes; Fluid consistency: Thin  Diet effective now                  EDUCATION NEEDS:   Not appropriate for  education at this time  Skin:  Skin Assessment: Reviewed RN Assessment (surgical abdominal wound 15.5 cm x 14.6 cm x 7 cm)  Last BM:  7/19  Height:   Ht Readings from Last 1 Encounters:  03/01/22 _0  (1.499 m)    Weight:   Wt Readings from Last 1 Encounters:  03/26/22 98.7 kg    Ideal Body Weight:  44.3 kg  BMI:  Body mass index is 43.96 kg/m.  Estimated Nutritional Needs:   Kcal:  1800-2100kcal/day  Protein:  90-110g/d  Fluid:  1.4-1.6L/day  CKoleen DistanceMS, RD, LDN Please refer to [facility]for RD and/or RD on-call/weekend/after hours pager

## 2022-03-26 NOTE — TOC Progression Note (Addendum)
Transition of Care [facility]) - Progression Note    Patient Details  Name: Debra [name] MRN: [id number] Date of Birth: Aug 18, 1966  Transition of Care [facility]) CM/SW [place], RN Phone Number: 03/26/2022, 11:04 AM  Clinical Narrative:     TOC continues to monitor and follow for needs and DC planning Continues TPN  Now on Clear liquid diet, walking in the room and hall with daughter and rolling walker, walking 200 ft [name] pouch still in place to be changed again 7/27 Plan remains to DC with [facility] and get DME thru charity if needed, appears will no longer need a wheelchair   Expected Discharge Plan: [place] Barriers to Discharge: Continued Medical Work up  Expected Discharge Plan and Services Expected Discharge Plan: [place]                                               Social Determinants of Health (SDOH) Interventions    Readmission Risk Interventions     No data to display

## 2022-03-26 NOTE — Progress Notes (Signed)
PT Cancellation Note  Patient Details Name: Orie Cuttino MRN: [id number] DOB: 08/14/1966   Cancelled Treatment:    Reason Eval/Treat Not Completed: Other (comment).  Chart reviewed and attempted to see pt.  Upon entry, pt upright at EOB eating breakfast with daughter.  Requested for therapist to come back later.  Will re-attempt as medically appropriate.   Gwenlyn Saran, PT, DPT 03/26/22, 10:50 AM

## 2022-03-26 NOTE — Progress Notes (Signed)
PHARMACY - TOTAL PARENTERAL NUTRITION CONSULT NOTE   Indication: Prolonged ileus  Patient Measurements: Height: '4\' 11"'$  (149.9 cm) Weight: 98.7 kg (217 lb 10.2 oz) IBW/kg (Calculated) : 43.2 TPN AdjBW (KG): 55.2 Body mass index is 43.96 kg/m.  Assessment:  56 y.o. female s/p laparotomy, excision of greater omental mass, abdominal wall reconstruction with [name],appendectomy, and placement of Prevena vac.  Glucose / Insulin: No apparent history of diabetes. BG goal 140 - 180. BG range 118 - 159 preceding 24h (6u SSI required). TPN bag includes 30u of insulin Electrolytes: Within normal limits Renal: Scr < 1, stable Hepatic: No transaminitis. LFTs within normal limits Intake/Output from previous day: 07/18 0701 - 07/19 0700 In: -  Out: 1050 [Drains:300] Intake/Output this shift: Total I/O In: 1438.3 [I.V.:1438.3] GI Imaging: GI Surgeries / Procedures: s/p laparotomy, excision of greater omental mass, abdominal wall reconstruction with [name],appendectomy, and placement of Prevena vac  Central access: 02/15/22 TPN start date: 02/15/22  Nutritional Goals: Goal TPN rate of 65 ml/h (provides 106 g of protein and 1872 kcals per day)  RD Assessment:  Estimated Needs Total Energy Estimated Needs: 1800-2100kcal/day Total Protein Estimated Needs: 90-110g/d Total Fluid Estimated Needs: 1.4-1.6L/day  Current Nutrition:  CLD + nutritional supplements  Plan:  Continue TPN at 67m/hr Nutritional Components Amino acids (using 15% Clinisol): 106 grams Dextrose: 265.2 grams Lipids (using 20% SMOFlipids): 54.6 grams kCal: 1872/24h  Electrolytes in TPN (standard): Na 576m/L, K 7531mL, Ca 5mE41m, Mg 10mE17m and Phos 25 mmol/L. Cl:Ac 1:1 Enteral multivitamin. Add trace elements to TPN Continue moderate SSI TIDM + CBG 4x/day (ACHS).  Continue insulin at 30 units in TPN  Monitor TPN labs on Mon/Thurs  RodneVallery SarmD, BCPS 03/26/2022 9:41 AM

## 2022-03-26 NOTE — Progress Notes (Signed)
CC: EC fistula Subjective: He is doing well no pain she has been walking in the room.  [name] pouch is intact  Objective: Vital signs in last 24 hours: Temp:  [98.2 F (36.8 C)-99.1 F (37.3 C)] 98.2 F (36.8 C) (07/19 1535) Pulse Rate:  [73-77] 73 (07/19 1535) Resp:  [18-20] 18 (07/19 1535) BP: (86-109)/(48-59) 86/51 (07/19 1535) SpO2:  [95 %-98 %] 96 % (07/19 1535) Weight:  [98.7 kg] 98.7 kg (07/19 0500) Last BM Date : 03/26/22  Intake/Output from previous day: 07/18 0701 - 07/19 0700 In: -  Out: 1050 [Drains:300] Intake/Output this shift: Total I/O In: 1438.3 [I.V.:1438.3] Out: -   Physical exam: Constitutional: alert, cooperative and no distress  Respiratory: breathing non-labored at rest  Cardiovascular: regular rate and sinus rhythm  Gastrointestinal: Soft, abdominal soreness on the right, non-distended, no rebound/guarding. Integumentary: Midline wound open, There is feculent drainage present. Red rubbed in better position. [name] pouch without leak.    Lab Results: CBC  Recent Labs    03/24/22 0503  WBC 3.6*  HGB 7.4*  HCT 23.8*  PLT 140*   BMET Recent Labs    03/24/22 0503  NA 131*  K 4.4  CL 104  CO2 24  GLUCOSE 149*  BUN 17  CREATININE 0.46  CALCIUM 7.5*   PT/INR No results for input(s): "LABPROT", "INR" in the last 72 hours. ABG No results for input(s): "PHART", "HCO3" in the last 72 hours.  Invalid input(s): "PCO2", "PO2"  Studies/Results: No results found.  Anti-infectives: Anti-infectives (From admission, onward)    Start     Dose/Rate Route Frequency Ordered Stop   03/04/22 2200  piperacillin-tazobactam (ZOSYN) IVPB 3.375 g        3.375 g 12.5 mL/hr over 240 Minutes Intravenous Every 8 hours 03/04/22 2005 03/18/22 0248   02/21/22 1500  vancomycin (VANCOREADY) IVPB 1250 mg/250 mL  Status:  Discontinued        1,250 mg 166.7 mL/hr over 90 Minutes Intravenous Every 24 hours 02/21/22 1111 02/24/22 1336   02/21/22 1400  meropenem  (MERREM) 1 g in sodium chloride 0.9 % 100 mL IVPB        1 g 200 mL/hr over 30 Minutes Intravenous Every 8 hours 02/21/22 1258 02/27/22 2157   02/20/22 1500  vancomycin (VANCOCIN) IVPB 1000 mg/200 mL premix  Status:  Discontinued        1,000 mg 200 mL/hr over 60 Minutes Intravenous Every 24 hours 02/19/22 1706 02/21/22 1111   02/19/22 1115  vancomycin (VANCOREADY) IVPB 2000 mg/400 mL        2,000 mg 200 mL/hr over 120 Minutes Intravenous  Once 02/19/22 1025 02/19/22 1732   02/19/22 1115  fluconazole (DIFLUCAN) IVPB 400 mg        400 mg 100 mL/hr over 120 Minutes Intravenous Every 24 hours 02/19/22 1025 03/04/22 1301   02/15/22 0730  piperacillin-tazobactam (ZOSYN) IVPB 3.375 g  Status:  Discontinued        3.375 g 12.5 mL/hr over 240 Minutes Intravenous Every 8 hours 02/15/22 0639 02/21/22 1235   02/15/22 0655  piperacillin-tazobactam (ZOSYN) 3.375 GM/50ML IVPB       Note to Pharmacy: Glenford Peers N: cabinet override      02/15/22 0655 02/15/22 0816   02/13/22 2200  cefoTEtan (CEFOTAN) 2 g in sodium chloride 0.9 % 100 mL IVPB  Status:  Discontinued        2 g 200 mL/hr over 30 Minutes Intravenous Every 12 hours 02/13/22 1541 02/15/22  2703   02/13/22 1345  cefoTEtan (CEFOTAN) 2 g in sodium chloride 0.9 % 100 mL IVPB        2 g 200 mL/hr over 30 Minutes Intravenous  Once 02/13/22 1331 02/13/22 2146   02/13/22 1336  sodium chloride 0.9 % with cefoTEtan (CEFOTAN) ADS Med       Note to Pharmacy: Rulon Sera: cabinet override      02/13/22 1336 02/14/22 0144   02/13/22 0620  sodium chloride 0.9 % with cefoTEtan (CEFOTAN) ADS Med       Note to Pharmacy: Norton Blizzard A: cabinet override      02/13/22 0620 02/13/22 0802   02/13/22 0600  cefoTEtan (CEFOTAN) 2 g in sodium chloride 0.9 % 100 mL IVPB        2 g 200 mL/hr over 30 Minutes Intravenous On call to O.R. 02/12/22 2229 02/13/22 2146       Assessment/Plan:  PT w high output fistula. No major changes. Keep [name] and  [place], MD, [facility]  03/26/2022

## 2022-03-27 LAB — COMPREHENSIVE METABOLIC PANEL
ALT: 13 U/L (ref 0–44)
AST: 17 U/L (ref 15–41)
Albumin: 1.7 g/dL — ABNORMAL LOW (ref 3.5–5.0)
Alkaline Phosphatase: 52 U/L (ref 38–126)
Anion gap: 1 — ABNORMAL LOW (ref 5–15)
BUN: 15 mg/dL (ref 6–20)
CO2: 24 mmol/L (ref 22–32)
Calcium: 7.4 mg/dL — ABNORMAL LOW (ref 8.9–10.3)
Chloride: 106 mmol/L (ref 98–111)
Creatinine, Ser: 0.38 mg/dL — ABNORMAL LOW (ref 0.44–1.00)
GFR, Estimated: >60 mL/min (ref >60)
Glucose, Bld: 140 mg/dL — ABNORMAL HIGH (ref 70–99)
Potassium: 4.1 mmol/L (ref 3.5–5.1)
Sodium: 131 mmol/L — ABNORMAL LOW (ref 135–145)
Total Bilirubin: 0.4 mg/dL (ref 0.3–1.2)
Total Protein: 5.7 g/dL — ABNORMAL LOW (ref 6.5–8.1)

## 2022-03-27 LAB — GLUCOSE, CAPILLARY
Glucose-Capillary: 170 mg/dL — ABNORMAL HIGH (ref 70–99)
Glucose-Capillary: 162 mg/dL — ABNORMAL HIGH (ref 70–99)
Glucose-Capillary: 139 mg/dL — ABNORMAL HIGH (ref 70–99)
Glucose-Capillary: 142 mg/dL — ABNORMAL HIGH (ref 70–99)

## 2022-03-27 LAB — MAGNESIUM: Magnesium: 1.9 mg/dL (ref 1.7–2.4)

## 2022-03-27 LAB — PHOSPHORUS: Phosphorus: 2.8 mg/dL (ref 2.5–4.6)

## 2022-03-27 LAB — TRIGLYCERIDES: Triglycerides: 68 mg/dL (ref <150)

## 2022-03-27 MED ORDER — TRACE MINERALS CU-MN-SE-ZN 300-55-60-3000 MCG/ML IV SOLN
INTRAVENOUS | Status: AC
  Filled 2022-03-27: qty 707.2

## 2022-03-27 NOTE — Progress Notes (Addendum)
[place] [facility] Day(s): 42.   Post op day(s): 40 Days Post-Op.   Interval History:  Patient seen and examined No issues overnight No abdominal pain; nausea, emesis, fevers Nutritional labs this morning ar reassuring  [name] Pouch output 800 ccs in last 24 hours; changed 07/17 She is on CLD; on TPN Working with therapies; recommending [place]  Pathology reviewed:  -- Metastatic adenocarcinoma; colon origin -- Plan for output follow up with oncology (Dr Tasia Catchings)  Vital signs in last 24 hours: [min-max] current  Temp:  [98.2 F (36.8 C)-99.1 F (37.3 C)] 98.5 F (36.9 C) (07/20 0443) Pulse Rate:  [71-76] 71 (07/20 0443) Resp:  [18-20] 20 (07/20 0443) BP: (86-109)/(50-57) 94/51 (07/20 0443) SpO2:  [96 %-100 %] 97 % (07/20 0443)     Height: '4\' 11"'$  (149.9 cm) Weight: 98.7 kg BMI (Calculated): 43.93   Intake/Output last 2 shifts:  07/19 0701 - 07/20 0700 In: 1438.3 [I.V.:1438.3] Out: 700    Physical Exam:  Constitutional: alert, cooperative and no distress  Respiratory: breathing non-labored at rest  Cardiovascular: regular rate and sinus rhythm  Gastrointestinal: Soft, abdominal soreness on the right, non-distended, no rebound/guarding. Integumentary: Midline wound open, peritoneum closed with fistulous connection on left lateral aspect and possible in RLQ. Mesh excised. There is feculent drainage present. Red rubbed in better position. [name] pouch without leak.   Labs:     Latest Ref Rng & Units 03/24/2022    5:03 AM 03/12/2022   10:54 AM 03/11/2022    4:55 AM  CBC  WBC 4.0 - 10.5 K/uL 3.6  5.5  5.7   Hemoglobin 12.0 - 15.0 g/dL 7.4  8.9  7.5   Hematocrit 36.0 - 46.0 % 23.8  28.4  24.0   Platelets 150 - 400 K/uL 140  166  184       Latest Ref Rng & Units 03/27/2022    5:10 AM 03/24/2022    5:03 AM 03/20/2022    5:37 AM  CMP  Glucose 70 - 99 mg/dL 140  149  142   BUN 6 - 20 mg/dL '15  17  19   '$ Creatinine 0.44 - 1.00 mg/dL 0.38  0.46   0.51   Sodium 135 - 145 mmol/L 131  131  135   Potassium 3.5 - 5.1 mmol/L 4.1  4.4  4.2   Chloride 98 - 111 mmol/L 106  104  108   CO2 22 - 32 mmol/L '24  24  25   '$ Calcium 8.9 - 10.3 mg/dL 7.4  7.5  7.5   Total Protein 6.5 - 8.1 g/dL 5.7  6.0  5.9   Total Bilirubin 0.3 - 1.2 mg/dL 0.4  0.3  0.5   Alkaline Phos 38 - 126 U/L 52  52  57   AST 15 - 41 U/L '17  21  26   '$ ALT 0 - 44 U/L '13  14  16      '$ Imaging studies: No new pertinent imaging studies    Assessment/Plan: 56 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   - Wound Care: [name] pouch in place with red rubber present to manage drainage. Anticipate fluid will pool in the wound bed while she is in bed. This has become semi-solid stool. It is  okay to adjust the red rubber catheter from time to time to help with drainage while in bed but she remain in dependent portion of [name]. This can be removed from suction to ambulate and work with therapies. These pouches need changed every 7-10 days (ideally). Changed 07/17; next change 07/27 if no issues in interim  - Continue CLD in effort to thin up effluent + nutritional supplementation - Continue TPN at goal; monitor electrolytes - Continue octreotide; now on infusion, continue imodium   - ID on board; she has completed 2 weeks of Zosyn; no further fevers  - Monitor abdominal examination; on-going bowel function  - Pain control prn; antemetic prn  - Out of bed; therapies on board; recommendations are SNF   - Discharge Planning: Anticipate lengthy admission and potential eventual transfer   All of the above findings and recommendations were discussed with the patient, and the medical team, and all of patient's questions were answered to her  expressed satisfaction.  -- [name], PA-C [facility] 03/27/2022, 7:46 AM M-F: 7am - 4pm

## 2022-03-27 NOTE — Progress Notes (Signed)
Mobility Specialist - Progress Note    03/27/22 1600  Mobility  Activity Ambulated with assistance in hallway;Stood at bedside;Dangled on edge of bed  Level of Assistance Modified independent, requires aide device or extra time  Assistive Device Front wheel walker  Distance Ambulated (ft) 160 ft  Activity Response Tolerated well  $Mobility charge 1 Mobility    Debra [name] Mobility Specialist 03/27/22, 4:16 PM

## 2022-03-27 NOTE — Progress Notes (Signed)
PHARMACY - TOTAL PARENTERAL NUTRITION CONSULT NOTE   Indication: Prolonged ileus  Patient Measurements: Height: '4\' 11"'$  (149.9 cm) Weight: 98.7 kg (217 lb 10.2 oz) IBW/kg (Calculated) : 43.2 TPN AdjBW (KG): 55.2 Body mass index is 43.96 kg/m.  Assessment:  56 y.o. female s/p laparotomy, excision of greater omental mass, abdominal wall reconstruction with [name],appendectomy, and placement of Prevena vac.  Glucose / Insulin: No apparent history of diabetes. BG goal 140 - 180. BG range 170 preceding 24h (6u SSI required). TPN bag includes 30u of insulin Electrolytes: Within normal limits Renal: Scr < 1, stable Hepatic: No transaminitis. LFTs within normal limits Intake/Output from previous day: 07/18 0701 - 07/19 0700 In: -  Out: 1050 [Drains:300] Intake/Output this shift: Total I/O In: 1438.3 [I.V.:1438.3] GI Imaging: GI Surgeries / Procedures: s/p laparotomy, excision of greater omental mass, abdominal wall reconstruction with [name],appendectomy, and placement of Prevena vac  Central access: 02/15/22 TPN start date: 02/15/22  Nutritional Goals: Goal TPN rate of 65 ml/h (provides 106 g of protein and 1872 kcals per day)  RD Assessment:  Estimated Needs Total Energy Estimated Needs: 1800-2100kcal/day Total Protein Estimated Needs: 90-110g/d Total Fluid Estimated Needs: 1.4-1.6L/day  Current Nutrition:  CLD + nutritional supplements  Plan:  Continue TPN at 70m/hr Nutritional Components Amino acids (using 15% Clinisol): 106 grams Dextrose: 265.2 grams Lipids (using 20% SMOFlipids): 54.6 grams kCal: 1872/24h  Electrolytes in TPN (standard): Na 5105m/L, K 7062mL, Ca 5mE53m, Mg 10mE30m and Phos 25 mmol/L. Cl:Ac 1:1 Enteral multivitamin. Add trace elements to TPN Continue moderate SSI TIDM + CBG 4x/day (ACHS).  Continue insulin at 30 units in TPN  Monitor TPN labs on Mon/Thurs  KishaEleonore ChiquitormD, BCPS 03/27/2022 9:26 AM

## 2022-03-27 NOTE — Progress Notes (Addendum)
Physical Therapy Discharge Patient Details Name: Debra [name] MRN: [id number] DOB: August 28, 1966 Today's Date: 03/27/2022 Time: 1350-1400 PT Time Calculation (min) (ACUTE ONLY): 10 min  Patient discharged from PT services secondary to goals met and no further PT needs identified.  Please see latest therapy progress note for current level of functioning and progress toward goals.    Progress and discharge plan discussed with patient and/or caregiver: Patient/Caregiver agrees with plan  Julaine Fusi PTA 03/27/22, 3:11 PM

## 2022-03-28 ENCOUNTER — Inpatient Hospital Stay: Payer: Medicaid Other

## 2022-03-28 LAB — GLUCOSE, CAPILLARY
Glucose-Capillary: 152 mg/dL — ABNORMAL HIGH (ref 70–99)
Glucose-Capillary: 155 mg/dL — ABNORMAL HIGH (ref 70–99)
Glucose-Capillary: 124 mg/dL — ABNORMAL HIGH (ref 70–99)
Glucose-Capillary: 112 mg/dL — ABNORMAL HIGH (ref 70–99)

## 2022-03-28 MED ORDER — TRACE MINERALS CU-MN-SE-ZN 300-55-60-3000 MCG/ML IV SOLN
INTRAVENOUS | Status: AC
  Filled 2022-03-28: qty 707.2

## 2022-03-28 NOTE — Progress Notes (Signed)
[place] Hospital Day(s): 89.   Post op day(s): 41 Days Post-Op.   Interval History:  Patient seen and examined No issues overnight No abdominal pain; nausea, emesis, fevers No labs this morning  [name] Pouch output 950 ccs in last 24 hours; changed 07/17 She is on CLD; on TPN Working with therapies; recommending [place]  Pathology reviewed:  -- Metastatic adenocarcinoma; colon origin -- Plan for output follow up with oncology (Dr Tasia Catchings)  Vital signs in last 24 hours: [min-max] current  Temp:  [98.2 F (36.8 C)-99 F (37.2 C)] 99 F (37.2 C) (07/21 0316) Pulse Rate:  [73-77] 73 (07/21 0316) Resp:  [16-21] 16 (07/21 0316) BP: (96-103)/(48-59) 96/53 (07/21 0316) SpO2:  [95 %-100 %] 95 % (07/21 0316) Weight:  [96.7 kg] 96.7 kg (07/21 0437)     Height: '4\' 11"'$  (149.9 cm) Weight: 96.7 kg BMI (Calculated): 43.04   Intake/Output last 2 shifts:  07/20 0701 - 07/21 0700 In: 2089.1 [P.O.:300; I.V.:1789.1] Out: 950 [Drains:950]   Physical Exam:  Constitutional: alert, cooperative and no distress  Respiratory: breathing non-labored at rest  Cardiovascular: regular rate and sinus rhythm  Gastrointestinal: Soft, abdominal soreness on the right, non-distended, no rebound/guarding. Integumentary: Midline wound open, peritoneum closed with fistulous connection on left lateral aspect and possible in RLQ. Mesh excised. There is feculent drainage present. Red rubbed in better position. [name] pouch without leak.   Labs:     Latest Ref Rng & Units 03/24/2022    5:03 AM 03/12/2022   10:54 AM 03/11/2022    4:55 AM  CBC  WBC 4.0 - 10.5 K/uL 3.6  5.5  5.7   Hemoglobin 12.0 - 15.0 g/dL 7.4  8.9  7.5   Hematocrit 36.0 - 46.0 % 23.8  28.4  24.0   Platelets 150 - 400 K/uL 140  166  184       Latest Ref Rng & Units 03/27/2022    5:10 AM 03/24/2022    5:03 AM 03/20/2022    5:37 AM  CMP  Glucose 70 - 99 mg/dL 140  149  142   BUN 6 - 20 mg/dL '15  17  19    '$ Creatinine 0.44 - 1.00 mg/dL 0.38  0.46  0.51   Sodium 135 - 145 mmol/L 131  131  135   Potassium 3.5 - 5.1 mmol/L 4.1  4.4  4.2   Chloride 98 - 111 mmol/L 106  104  108   CO2 22 - 32 mmol/L '24  24  25   '$ Calcium 8.9 - 10.3 mg/dL 7.4  7.5  7.5   Total Protein 6.5 - 8.1 g/dL 5.7  6.0  5.9   Total Bilirubin 0.3 - 1.2 mg/dL 0.4  0.3  0.5   Alkaline Phos 38 - 126 U/L 52  52  57   AST 15 - 41 U/L '17  21  26   '$ ALT 0 - 44 U/L '13  14  16      '$ Imaging studies: No new pertinent imaging studies; venous US pending    Assessment/Plan: 56 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   - Wound Care: [name] pouch in place with red rubber present to manage drainage. Anticipate fluid will pool in the wound bed while she  is in bed. This has become semi-solid stool. It is okay to adjust the red rubber catheter from time to time to help with drainage while in bed but she remain in dependent portion of [name]. This can be removed from suction to ambulate and work with therapies. These pouches need changed every 7-10 days (ideally). Changed 07/17; next change 07/27 if no issues in interim  - Continue CLD in effort to thin up effluent + nutritional supplementation - Continue TPN at goal; monitor electrolytes - Continue octreotide; now on infusion, continue imodium   - ID on board; she has completed 2 weeks of Zosyn; no further fevers  - Monitor abdominal examination; on-going bowel function  - Pain control prn; antemetic prn  - Out of bed; therapies on board; recommendations are SNF   - Discharge Planning: Anticipate lengthy admission and potential eventual transfer   All of the above findings and recommendations were discussed with the patient, and the  medical team, and all of patient's questions were answered to her expressed satisfaction.  -- [name], PA-C [facility] 03/28/2022, 7:44 AM M-F: 7am - 4pm

## 2022-03-28 NOTE — Progress Notes (Signed)
PHARMACY - TOTAL PARENTERAL NUTRITION CONSULT NOTE   Indication: Prolonged ileus  Patient Measurements: Height: '4\' 11"'$  (149.9 cm) Weight: 96.7 kg (213 lb 3 oz) IBW/kg (Calculated) : 43.2 TPN AdjBW (KG): 55.2 Body mass index is 43.06 kg/m.  Assessment:  56 y.o. female s/p laparotomy, excision of greater omental mass, abdominal wall reconstruction with [name],appendectomy, and placement of Prevena vac.  Glucose / Insulin: No apparent history of diabetes. BG goal 140 - 170. BG range 170 preceding 24h (11u SSI required). TPN bag includes 30u of insulin Electrolytes: Within normal limits Renal: Scr < 1, stable Hepatic: No transaminitis. LFTs within normal limits Intake/Output from previous day: 07/18 0701 - 07/19 0700 In: -  Out: 1050 [Drains:300] Intake/Output this shift: Total I/O In: 1438.3 [I.V.:1438.3] GI Imaging: GI Surgeries / Procedures: s/p laparotomy, excision of greater omental mass, abdominal wall reconstruction with [name],appendectomy, and placement of Prevena vac  Central access: 02/15/22 TPN start date: 02/15/22  Nutritional Goals: Goal TPN rate of 65 ml/h (provides 106 g of protein and 1872 kcals per day)  RD Assessment:  Estimated Needs Total Energy Estimated Needs: 1800-2100kcal/day Total Protein Estimated Needs: 90-110g/d Total Fluid Estimated Needs: 1.4-1.6L/day  Current Nutrition:  CLD + nutritional supplements  Plan:  Continue TPN at 53m/hr Nutritional Components Amino acids (using 15% Clinisol): 106 grams Dextrose: 265.2 grams Lipids (using 20% SMOFlipids): 54.6 grams kCal: 1872/24h  Electrolytes in TPN (standard): Na 559m/L, K 7064mL, Ca 5mE16m, Mg 10mE42m and Phos 25 mmol/L. Cl:Ac 1:1 Enteral multivitamin. Add trace elements to TPN Continue moderate SSI TIDM + CBG 4x/day (ACHS).  Continue insulin at 30 units in TPN  Monitor TPN labs on Mon/Thurs  AnderWardmacist 03/28/2022 9:11  AM

## 2022-03-29 ENCOUNTER — Inpatient Hospital Stay: Payer: Medicaid Other

## 2022-03-29 LAB — GLUCOSE, CAPILLARY
Glucose-Capillary: 149 mg/dL — ABNORMAL HIGH (ref 70–99)
Glucose-Capillary: 148 mg/dL — ABNORMAL HIGH (ref 70–99)
Glucose-Capillary: 148 mg/dL — ABNORMAL HIGH (ref 70–99)
Glucose-Capillary: 142 mg/dL — ABNORMAL HIGH (ref 70–99)

## 2022-03-29 MED ORDER — TRACE MINERALS CU-MN-SE-ZN 300-55-60-3000 MCG/ML IV SOLN
INTRAVENOUS | Status: AC
  Filled 2022-03-29: qty 707.2

## 2022-03-29 NOTE — Progress Notes (Signed)
EC fistula Some nausea DVT noted in the right arm. Picc removed and TPN to be infused and poor. We will reassess for DVT on Monday Anticoagulation yet Fistula 1150cc  PE NAD Abd : eakin poch, no major changes/  A/P no major changes Repeat U/S Monday PT, wound care and TPN

## 2022-03-29 NOTE — Progress Notes (Signed)
PHARMACY - TOTAL PARENTERAL NUTRITION CONSULT NOTE   Indication: Prolonged ileus  Patient Measurements: Height: '4\' 11"'$  (149.9 cm) Weight: 96.7 kg (213 lb 3 oz) IBW/kg (Calculated) : 43.2 TPN AdjBW (KG): 55.2 Body mass index is 43.06 kg/m.  Assessment:  56 y.o. female s/p laparotomy, excision of greater omental mass, abdominal wall reconstruction with [name],appendectomy, and placement of Prevena vac.  Glucose / Insulin: No apparent history of diabetes. BG goal 140 - 170. BG range 110-160s preceding 24h (10u SSI required). TPN bag includes 30u of insulin Electrolytes: Within normal limits Renal: Scr < 1, stable Hepatic: No transaminitis. LFTs within normal limits. Trig's 80 (7/17) > 68 (7/20)  Intake/Output: Total I/O: Net +4L GI Imaging: GI Surgeries / Procedures: s/p laparotomy, excision of greater omental mass, abdominal wall reconstruction with [name],appendectomy, and placement of Prevena vac  Central access: 02/15/22 TPN start date: 02/15/22  Nutritional Goals: Goal TPN rate of 65 ml/h (provides 106 g of protein and 1872 kcals per day)  RD Assessment:  Estimated Needs Total Energy Estimated Needs: 1800-2100kcal/day Total Protein Estimated Needs: 90-110g/d Total Fluid Estimated Needs: 1.4-1.6L/day  Current Nutrition:  CLD + nutritional supplements  Plan:  Continue TPN at 13m/hr Nutritional Components Amino acids (using 15% Clinisol): 106 grams Dextrose: 265.2 grams Lipids (using 20% SMOFlipids): 54.6 grams kCal: 1872/24h  Electrolytes in TPN (standard): Na 581m/L, K 7017mL, Ca 5mE14m, Mg 10mE55m and Phos 25 mmol/L. Cl:Ac 1:1 Enteral multivitamin. Add trace elements to TPN Continue moderate SSI TIDM + CBG 4x/day (ACHS).  Continue insulin at 30 units in TPN  Monitor TPN labs on Mon/Thurs  BrandLorna Dibbleical Pharmacist 03/29/2022 8:15 AM

## 2022-03-30 LAB — GLUCOSE, CAPILLARY
Glucose-Capillary: 204 mg/dL — ABNORMAL HIGH (ref 70–99)
Glucose-Capillary: 160 mg/dL — ABNORMAL HIGH (ref 70–99)
Glucose-Capillary: 145 mg/dL — ABNORMAL HIGH (ref 70–99)
Glucose-Capillary: 114 mg/dL — ABNORMAL HIGH (ref 70–99)

## 2022-03-30 MED ORDER — TRACE MINERALS CU-MN-SE-ZN 300-55-60-3000 MCG/ML IV SOLN
INTRAVENOUS | Status: AC
  Filled 2022-03-30: qty 707.2

## 2022-03-30 NOTE — Progress Notes (Signed)
End of shift note:  Pt's eakin pouch output was 500 ml during this shift.

## 2022-03-30 NOTE — Progress Notes (Signed)
Mobility Specialist - Progress Note   03/30/22 1500  Mobility  Activity Ambulated independently in room;Stood at bedside  Level of Assistance Modified independent, requires aide device or extra time  Assistive Device Other (Comment) (IV Pole)  Distance Ambulated (ft) 20 ft  Activity Response Tolerated well  $Mobility charge 1 Mobility     Author responds to bed alarm. Pt ambulates to and from bathroom ModI with IV pole on room air. Slow and cautious gait. Tolerates well and returns to bed with needs in reach.  Merrily Brittle Mobility Specialist 03/30/22, 3:06 PM

## 2022-03-30 NOTE — Plan of Care (Signed)
Patient remains Debra [name].Patient continues to deny pain even when asked as nurse noted her facial grimacing with movement in bed, Patient taking meds orally. [name] pouch in place with moderate output, no leaks around site, connected to suction. Patient sat bedside and dangled feet for brief time.  Problem: Clinical Measurements: Goal: Will remain free from infection Outcome: Progressing   Problem: Nutrition: Goal: Adequate nutrition will be maintained Outcome: Progressing   Problem: Activity: Goal: Risk for activity intolerance will decrease Outcome: Progressing   Problem: Nutrition: Goal: Adequate nutrition will be maintained Outcome: Progressing   Problem: Coping: Goal: Level of anxiety will decrease Outcome: Progressing   Problem: Pain Managment: Goal: General experience of comfort will improve Outcome: Progressing

## 2022-03-30 NOTE — Progress Notes (Signed)
PHARMACY - TOTAL PARENTERAL NUTRITION CONSULT NOTE   Indication: Prolonged ileus  Patient Measurements: Height: '4\' 11"'$  (149.9 cm) Weight: 96.7 kg (213 lb 3 oz) IBW/kg (Calculated) : 43.2 TPN AdjBW (KG): 55.2 Body mass index is 43.06 kg/m.  Assessment:  56 y.o. female s/p laparotomy, excision of greater omental mass, abdominal wall reconstruction with [name],appendectomy, and placement of Prevena vac.  Glucose / Insulin: No apparent history of diabetes. BG goal 140 - 170. BG range 140-200s preceding 24h (11u SSI required). TPN bag includes 30u of insulin Electrolytes: Within normal limits Renal: Scr < 1, stable Hepatic: No transaminitis. LFTs within normal limits. Trig's 80 (7/17) > 68 (7/20)  Intake/Output: Total I/O: Net +2.2L GI Imaging: GI Surgeries / Procedures: s/p laparotomy, excision of greater omental mass, abdominal wall reconstruction with [name],appendectomy, and placement of Prevena vac  Central access: 02/15/22 TPN start date: 02/15/22  Nutritional Goals: Goal TPN rate of 65 ml/h (provides 106 g of protein and 1872 kcals per day)  RD Assessment:  Estimated Needs Total Energy Estimated Needs: 1800-2100kcal/day Total Protein Estimated Needs: 90-110g/d Total Fluid Estimated Needs: 1.4-1.6L/day  Current Nutrition:  CLD + nutritional supplements  Plan:  Continue TPN at 69m/hr Nutritional Components Amino acids (using 15% Clinisol): 106 grams Dextrose: 265.2 grams Lipids (using 20% SMOFlipids): 54.6 grams kCal: 1872/24h  Electrolytes in TPN (standard): Na 532m/L, K 7016mL, Ca 5mE53m, Mg 10mE41m and Phos 25 mmol/L. Cl:Ac 1:1 Enteral multivitamin. Add trace elements to TPN Continue moderate SSI TIDM + CBG 4x/day (ACHS).  Increase insulin at 30>35 units in TPN  Monitor TPN labs on Mon/Thurs  BrandLorna Dibbleical Pharmacist 03/30/2022 11:12 AM

## 2022-03-30 NOTE — Progress Notes (Signed)
  EC fistula Some nausea DVT noted in the right arm. Picc removed and TPN to be infused and poor. We will reassess for DVT on Monday Anticoagulation yet Fistula 800cc   PE NAD Right arm w some edema but no necrosis or Phlegmasia Abd : eakin poch, no major changes.    A/P no major changes Repeat U/S tomorrow PT, wound care and TPN

## 2022-03-31 ENCOUNTER — Inpatient Hospital Stay: Payer: Medicaid Other

## 2022-03-31 LAB — CBC
HCT: 24.2 % — ABNORMAL LOW (ref 36.0–46.0)
Hemoglobin: 7.5 g/dL — ABNORMAL LOW (ref 12.0–15.0)
MCH: 25.5 pg — ABNORMAL LOW (ref 26.0–34.0)
MCHC: 31 g/dL (ref 30.0–36.0)
MCV: 82.3 fL (ref 80.0–100.0)
Platelets: 137 K/uL — ABNORMAL LOW (ref 150–400)
RBC: 2.94 MIL/uL — ABNORMAL LOW (ref 3.87–5.11)
RDW: 16.3 % — ABNORMAL HIGH (ref 11.5–15.5)
WBC: 4.2 K/uL (ref 4.0–10.5)
nRBC: 0 % (ref 0.0–0.2)

## 2022-03-31 LAB — GLUCOSE, CAPILLARY
Glucose-Capillary: 149 mg/dL — ABNORMAL HIGH (ref 70–99)
Glucose-Capillary: 175 mg/dL — ABNORMAL HIGH (ref 70–99)
Glucose-Capillary: 147 mg/dL — ABNORMAL HIGH (ref 70–99)
Glucose-Capillary: 106 mg/dL — ABNORMAL HIGH (ref 70–99)
Glucose-Capillary: 151 mg/dL — ABNORMAL HIGH (ref 70–99)

## 2022-03-31 LAB — COMPREHENSIVE METABOLIC PANEL
ALT: 12 U/L (ref 0–44)
AST: 20 U/L (ref 15–41)
Albumin: 1.9 g/dL — ABNORMAL LOW (ref 3.5–5.0)
Alkaline Phosphatase: 68 U/L (ref 38–126)
Anion gap: 4 — ABNORMAL LOW (ref 5–15)
BUN: 16 mg/dL (ref 6–20)
CO2: 24 mmol/L (ref 22–32)
Calcium: 7.7 mg/dL — ABNORMAL LOW (ref 8.9–10.3)
Chloride: 104 mmol/L (ref 98–111)
Creatinine, Ser: 0.36 mg/dL — ABNORMAL LOW (ref 0.44–1.00)
GFR, Estimated: >60 mL/min (ref >60)
Glucose, Bld: 152 mg/dL — ABNORMAL HIGH (ref 70–99)
Potassium: 3.9 mmol/L (ref 3.5–5.1)
Sodium: 132 mmol/L — ABNORMAL LOW (ref 135–145)
Total Bilirubin: 0.3 mg/dL (ref 0.3–1.2)
Total Protein: 5.9 g/dL — ABNORMAL LOW (ref 6.5–8.1)

## 2022-03-31 LAB — PHOSPHORUS: Phosphorus: 3.3 mg/dL (ref 2.5–4.6)

## 2022-03-31 LAB — TRIGLYCERIDES: Triglycerides: 66 mg/dL (ref <150)

## 2022-03-31 LAB — MAGNESIUM: Magnesium: 1.9 mg/dL (ref 1.7–2.4)

## 2022-03-31 MED ORDER — ADULT MULTIVITAMIN W/MINERALS CH
1.0000 | ORAL_TABLET | Freq: Every day | ORAL | Status: DC
  Administered 2022-03-31 – 2022-06-17 (×79): 1 via ORAL
  Filled 2022-03-31 (×79): qty 1

## 2022-03-31 MED ORDER — TRACE MINERALS CU-MN-SE-ZN 300-55-60-3000 MCG/ML IV SOLN
INTRAVENOUS | Status: AC
  Filled 2022-03-31: qty 707.2

## 2022-03-31 NOTE — Progress Notes (Signed)
[place] Hospital Day(s): 46.   Post op day(s): 44 Days Post-Op.   Interval History:  Patient seen and examined No issues overnight No abdominal pain; nausea, emesis, fevers Labs reassuring; Hgb trending down slowly; no electrolyte derangements  [name] Pouch output 950 ccs in last 24 hours; changed 07/17 She is on CLD; on TPN Working with therapies; recommending [place]  Pathology reviewed:  -- Metastatic adenocarcinoma; colon origin -- Plan for output follow up with oncology (Dr Tasia Catchings)  Vital signs in last 24 hours: [min-max] current  Temp:  [98.3 F (36.8 C)-99.4 F (37.4 C)] 98.3 F (36.8 C) (07/24 0745) Pulse Rate:  [78-86] 78 (07/24 0745) Resp:  [16-20] 16 (07/24 0745) BP: (87-113)/(44-58) 87/44 (07/24 0745) SpO2:  [97 %-98 %] 98 % (07/24 0745) Weight:  [94.6 kg] 94.6 kg (07/24 0500)     Height: '4\' 11"'$  (149.9 cm) Weight: 94.6 kg BMI (Calculated): 43.04   Intake/Output last 2 shifts:  07/23 0701 - 07/24 0700 In: 900.1 [P.O.:240; I.V.:660.1] Out: 900 [Drains:400]   Physical Exam:  Constitutional: alert, cooperative and no distress  Respiratory: breathing non-labored at rest  Cardiovascular: regular rate and sinus rhythm  Gastrointestinal: Soft, abdominal soreness on the right, non-distended, no rebound/guarding. Integumentary: Midline wound open, peritoneum closed with fistulous connection on left lateral aspect and possible in RLQ. Mesh excised. There is feculent drainage present. Red rubbed in better position. [name] pouch without leak.   Labs:     Latest Ref Rng & Units 03/31/2022    5:46 AM 03/24/2022    5:03 AM 03/12/2022   10:54 AM  CBC  WBC 4.0 - 10.5 K/uL 4.2  3.6  5.5   Hemoglobin 12.0 - 15.0 g/dL 7.5  7.4  8.9   Hematocrit 36.0 - 46.0 % 24.2  23.8  28.4   Platelets 150 - 400 K/uL 137  140  166       Latest Ref Rng & Units 03/31/2022    5:46 AM 03/27/2022    5:10 AM 03/24/2022    5:03 AM  CMP  Glucose 70 - 99 mg/dL  152  140  149   BUN 6 - 20 mg/dL '16  15  17   '$ Creatinine 0.44 - 1.00 mg/dL 0.36  0.38  0.46   Sodium 135 - 145 mmol/L 132  131  131   Potassium 3.5 - 5.1 mmol/L 3.9  4.1  4.4   Chloride 98 - 111 mmol/L 104  106  104   CO2 22 - 32 mmol/L '24  24  24   '$ Calcium 8.9 - 10.3 mg/dL 7.7  7.4  7.5   Total Protein 6.5 - 8.1 g/dL 5.9  5.7  6.0   Total Bilirubin 0.3 - 1.2 mg/dL 0.3  0.4  0.3   Alkaline Phos 38 - 126 U/L 68  52  52   AST 15 - 41 U/L '20  17  21   '$ ALT 0 - 44 U/L '12  13  14      '$ Imaging studies:  Repeat RUE [name] pending   Assessment/Plan: 56 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  New 07/24: Pending repeat US for RUE DVT; holding anticoagulation for now; PICC out on 07/21  - Wound Care: [name] pouch in place  with red rubber present to manage drainage. Anticipate fluid will pool in the wound bed while she is in bed. This has become semi-solid stool. It is okay to adjust the red rubber catheter from time to time to help with drainage while in bed but she remain in dependent portion of [name]. This can be removed from suction to ambulate and work with therapies. These pouches need changed every 7-10 days (ideally). Changed 07/17; next change 07/27 if no issues in interim  - Continue CLD in effort to thin up effluent + nutritional supplementation - Continue TPN at goal; monitor electrolytes  - Monitor abdominal examination; on-going bowel function  - Pain control prn; antemetic prn  - Out of bed; discharged from therapies; no recommendations at this time   - Discharge Planning: Anticipate lengthy admission and potential eventual transfer   All of the above findings and recommendations were discussed with the patient, and the medical  team, and all of patient's questions were answered to her expressed satisfaction.  -- [name], PA-C [facility] 03/31/2022, 7:50 AM M-F: 7am - 4pm

## 2022-03-31 NOTE — Progress Notes (Addendum)
PHARMACY - TOTAL PARENTERAL NUTRITION CONSULT NOTE   Indication: Prolonged ileus  Patient Measurements: Height: '4\' 11"'$  (149.9 cm) Weight: 94.6 kg (208 lb 8.9 oz) IBW/kg (Calculated) : 43.2 TPN AdjBW (KG): 55.2 Body mass index is 42.12 kg/m.  Assessment:  56 y.o. female s/p laparotomy, excision of greater omental mass, abdominal wall reconstruction with [name],appendectomy, and placement of Prevena vac.  Glucose / Insulin: No apparent history of diabetes. BG goal 140 - 180. BG range 114 - 204 preceding 24h (13u SSI required). TPN bag includes 35u of insulin Electrolytes: Mild, stable hyponatremia Renal: Scr ~ 0.5 Hepatic: No transaminitis. LFTs within normal limits. TG within normal limits Intake/Output: Total I/O: +2.3 L GI Imaging: GI Surgeries / Procedures: s/p laparotomy, excision of greater omental mass, abdominal wall reconstruction with [name], appendectomy, and placement of Prevena vac  Central access: 02/15/22 TPN start date: 02/15/22  Nutritional Goals: Goal TPN rate of 65 mL/hr (provides 106 g of protein and 1872 kcals per day)  RD Assessment:  Estimated Needs Total Energy Estimated Needs: 1800-2100kcal/day Total Protein Estimated Needs: 90-110g/d Total Fluid Estimated Needs: 1.4-1.6L/day  Current Nutrition:  CLD + nutritional supplements  Plan:  Continue TPN at 64m/hr Nutritional Components Amino acids (using 15% Clinisol): 106 grams Dextrose: 265.2 grams Lipids (using 20% SMOFlipids): 54.6 grams kCal: 1872/24h  Electrolytes in TPN (standard): Na 535m/L, K 7041mL, Ca 5mE12m, Mg 10mE10m and Phos 25 mmol/L. Cl:Ac 1:1 Enteral multivitamin. Add trace elements to TPN Continue moderate SSI TIDM + CBG 4x/day (ACHS) + 35u in TPN Monitor TPN labs on Mon/Thurs  Debra [name]/2023 11:50 AM

## 2022-03-31 NOTE — Plan of Care (Signed)
A&Ox4, standby assist to bathroom, eakin pouch output 458m overnight.    Problem: Clinical Measurements: Goal: Will remain free from infection Outcome: Progressing   Problem: Nutrition: Goal: Adequate nutrition will be maintained Outcome: Progressing   Problem: Activity: Goal: Risk for activity intolerance will decrease Outcome: Progressing   Problem: Nutrition: Goal: Adequate nutrition will be maintained Outcome: Progressing   Problem: Coping: Goal: Level of anxiety will decrease Outcome: Progressing   Problem: Pain Managment: Goal: General experience of comfort will improve Outcome: Progressing

## 2022-04-01 LAB — GLUCOSE, CAPILLARY
Glucose-Capillary: 163 mg/dL — ABNORMAL HIGH (ref 70–99)
Glucose-Capillary: 120 mg/dL — ABNORMAL HIGH (ref 70–99)
Glucose-Capillary: 127 mg/dL — ABNORMAL HIGH (ref 70–99)
Glucose-Capillary: 99 mg/dL (ref 70–99)

## 2022-04-01 MED ORDER — TRACE MINERALS CU-MN-SE-ZN 300-55-60-3000 MCG/ML IV SOLN
INTRAVENOUS | Status: AC
  Filled 2022-04-01: qty 707.2

## 2022-04-01 NOTE — Progress Notes (Addendum)
[facility] Day(s): 19.   Post op day(s): 45 Days Post-Op.   Interval History:  Patient seen and examined No issues overnight No abdominal pain; nausea, emesis, fevers No new labs this morning [name] Pouch output 1300 ccs in last 24 hours; changed 07/17 She is on CLD; on TPN Working with therapies; recommending [place]  Pathology reviewed:  -- Metastatic adenocarcinoma; colon origin -- Plan for output follow up with oncology (Dr Tasia Catchings)  Vital signs in last 24 hours: [min-max] current  Temp:  [98.2 F (36.8 C)-98.7 F (37.1 C)] 98.2 F (36.8 C) (07/25 0620) Pulse Rate:  [78-85] 79 (07/25 0620) Resp:  [16-21] 20 (07/25 0620) BP: (87-119)/(44-57) 96/57 (07/25 0620) SpO2:  [98 %-100 %] 98 % (07/25 0620) Weight:  [90.8 kg] 90.8 kg (07/25 0500)     Height: '4\' 11"'$  (149.9 cm) Weight: 90.8 kg BMI (Calculated): 43.04   Intake/Output last 2 shifts:  07/24 0701 - 07/25 0700 In: 1588.7 [I.V.:1588.7] Out: 650 [Drains:650]   Physical Exam:  Constitutional: alert, cooperative and no distress  Respiratory: breathing non-labored at rest  Cardiovascular: regular rate and sinus rhythm  Gastrointestinal: Soft, abdominal soreness on the right, non-distended, no rebound/guarding. Integumentary: Midline wound open, peritoneum closed with fistulous connection on left lateral aspect and possible in RLQ. Mesh excised. There is feculent drainage present. Red rubbed in better position. [name] pouch without leak.   Labs:     Latest Ref Rng & Units 03/31/2022    5:46 AM 03/24/2022    5:03 AM 03/12/2022   10:54 AM  CBC  WBC 4.0 - 10.5 K/uL 4.2  3.6  5.5   Hemoglobin 12.0 - 15.0 g/dL 7.5  7.4  8.9   Hematocrit 36.0 - 46.0 % 24.2  23.8  28.4   Platelets 150 - 400 K/uL 137  140  166       Latest Ref Rng & Units 03/31/2022    5:46 AM 03/27/2022    5:10 AM 03/24/2022    5:03 AM  CMP  Glucose 70 - 99 mg/dL 152  140  149   BUN 6 - 20 mg/dL '16  15  17    '$ Creatinine 0.44 - 1.00 mg/dL 0.36  0.38  0.46   Sodium 135 - 145 mmol/L 132  131  131   Potassium 3.5 - 5.1 mmol/L 3.9  4.1  4.4   Chloride 98 - 111 mmol/L 104  106  104   CO2 22 - 32 mmol/L '24  24  24   '$ Calcium 8.9 - 10.3 mg/dL 7.7  7.4  7.5   Total Protein 6.5 - 8.1 g/dL 5.9  5.7  6.0   Total Bilirubin 0.3 - 1.2 mg/dL 0.3  0.4  0.3   Alkaline Phos 38 - 126 U/L 68  52  52   AST 15 - 41 U/L '20  17  21   '$ ALT 0 - 44 U/L '12  13  14      '$ Imaging studies:  No new imaging studies   Assessment/Plan: 56 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  - New 07/25: Nothing new. She is on prophylactic Lovenox o/w holding any further anticoagulation for RUE DVT for now. May need repeat US next week.   - Wound  Care: [name] pouch in place with red rubber present to manage drainage. Anticipate fluid will pool in the wound bed while she is in bed. This has become semi-solid stool. It is okay to adjust the red rubber catheter from time to time to help with drainage while in bed but she remain in dependent portion of [name]. This can be removed from suction to ambulate and work with therapies. These pouches need changed every 7-10 days (ideally). Changed 07/17; next change 07/27 if no issues in interim  - Continue CLD in effort to thin up effluent + nutritional supplementation - Continue TPN at goal; monitor electrolytes  - Monitor abdominal examination; on-going bowel function  - Pain control prn; antemetic prn  - Out of bed; discharged from therapies; no recommendations at this time   - Discharge Planning: Anticipate lengthy admission and potential eventual transfer   All of the above findings and recommendations were discussed with the patient, and  the medical team, and all of patient's questions were answered to her expressed satisfaction.  -- [name], PA-C [facility] 04/01/2022, 7:27 AM M-F: 7am - 4pm

## 2022-04-01 NOTE — Progress Notes (Signed)
PHARMACY - TOTAL PARENTERAL NUTRITION CONSULT NOTE   Indication: Prolonged ileus  Patient Measurements: Height: '4\' 11"'$  (149.9 cm) Weight: 90.8 kg (200 lb 2.8 oz) IBW/kg (Calculated) : 43.2 TPN AdjBW (KG): 55.2 Body mass index is 40.43 kg/m.  Assessment:  56 y.o. female s/p laparotomy, excision of greater omental mass, abdominal wall reconstruction with [name],appendectomy, and placement of Prevena vac.  Glucose / Insulin: No apparent history of diabetes. BG goal 140 - 180. BG range 106 - 175 preceding 24h (2u SSI required). TPN bag includes 35u of insulin Electrolytes: Mild, stable hyponatremia Renal: Scr ~ 0.5 Hepatic: No transaminitis. LFTs within normal limits. TG within normal limits Intake/Output: Total I/O: +2.3 L GI Imaging: GI Surgeries / Procedures: s/p laparotomy, excision of greater omental mass, abdominal wall reconstruction with [name], appendectomy, and placement of Prevena vac  Central access: 02/15/22 TPN start date: 02/15/22  Nutritional Goals: Goal TPN rate of 65 mL/hr (provides 106 g of protein and 1872 kcals per day)  RD Assessment:  Estimated Needs Total Energy Estimated Needs: 1800-2100kcal/day Total Protein Estimated Needs: 90-110g/d Total Fluid Estimated Needs: 1.4-1.6L/day  Current Nutrition:  CLD + nutritional supplements  Plan:  Continue TPN at 96m/hr Nutritional Components Amino acids (using 15% Clinisol): 106 grams Dextrose: 265.2 grams Lipids (using 20% SMOFlipids): 54.6 grams kCal: 1872/24h  Electrolytes in TPN (standard): Na 59m/L, K 7061mL, Ca 5mE60m, Mg 10mE69m and Phos 25 mmol/L. Cl:Ac 1:1 Enteral multivitamin. Add trace elements to TPN Continue moderate SSI TIDM + CBG 4x/day (ACHS) + 33u in TPN Reduced insulin slightly given reduced use of SSI and slight down trending blood glucose over past 48 hours. Monitor TPN labs on Mon/Thurs   CarolWynelle Cleveland/2023 9:25 AM

## 2022-04-02 LAB — GLUCOSE, CAPILLARY
Glucose-Capillary: 157 mg/dL — ABNORMAL HIGH (ref 70–99)
Glucose-Capillary: 133 mg/dL — ABNORMAL HIGH (ref 70–99)
Glucose-Capillary: 151 mg/dL — ABNORMAL HIGH (ref 70–99)
Glucose-Capillary: 156 mg/dL — ABNORMAL HIGH (ref 70–99)

## 2022-04-02 MED ORDER — TRACE MINERALS CU-MN-SE-ZN 300-55-60-3000 MCG/ML IV SOLN
INTRAVENOUS | Status: AC
  Filled 2022-04-02: qty 707.2

## 2022-04-02 NOTE — Progress Notes (Signed)
[facility] Day(s): 48.   Post op day(s): 46 Days Post-Op.   Interval History:  Patient seen and examined No issues overnight No abdominal pain; nausea, emesis, fevers No new labs this morning; nutritional labs tomorrow  [name] Pouch output 1100 ccs in last 24 hours; changed 07/17 She is on CLD; on TPN Working with therapies; recommending [place]  Pathology reviewed:  -- Metastatic adenocarcinoma; colon origin -- Plan for output follow up with oncology (Dr Tasia Catchings)  Vital signs in last 24 hours: [min-max] current  Temp:  [98.6 F (37 C)-99.2 F (37.3 C)] 99.2 F (37.3 C) (07/26 0448) Pulse Rate:  [76-87] 87 (07/26 0448) Resp:  [16-18] 18 (07/26 0448) BP: (100-103)/(54-57) 103/57 (07/26 0448) SpO2:  [97 %-100 %] 100 % (07/26 0448) Weight:  [89.4 kg] 89.4 kg (07/26 0500)     Height: '4\' 11"'$  (149.9 cm) Weight: 89.4 kg BMI (Calculated): 43.04   Intake/Output last 2 shifts:  07/25 0701 - 07/26 0700 In: 2114.6 [P.O.:480; I.V.:1634.6] Out: 1100 [Drains:1100]   Physical Exam:  Constitutional: alert, cooperative and no distress  Respiratory: breathing non-labored at rest  Cardiovascular: regular rate and sinus rhythm  Gastrointestinal: Soft, abdominal soreness on the right, non-distended, no rebound/guarding. Integumentary: Midline wound open, peritoneum closed with fistulous connection on left lateral aspect and possible in RLQ. Mesh excised. There is feculent drainage present. Red rubbed in better position. [name] pouch without leak.  MSK: RUE still edematous; non-tender, peripheral pulses 2+  Labs:     Latest Ref Rng & Units 03/31/2022    5:46 AM 03/24/2022    5:03 AM 03/12/2022   10:54 AM  CBC  WBC 4.0 - 10.5 K/uL 4.2  3.6  5.5   Hemoglobin 12.0 - 15.0 g/dL 7.5  7.4  8.9   Hematocrit 36.0 - 46.0 % 24.2  23.8  28.4   Platelets 150 - 400 K/uL 137  140  166       Latest Ref Rng & Units 03/31/2022    5:46 AM 03/27/2022    5:10 AM  03/24/2022    5:03 AM  CMP  Glucose 70 - 99 mg/dL 152  140  149   BUN 6 - 20 mg/dL '16  15  17   '$ Creatinine 0.44 - 1.00 mg/dL 0.36  0.38  0.46   Sodium 135 - 145 mmol/L 132  131  131   Potassium 3.5 - 5.1 mmol/L 3.9  4.1  4.4   Chloride 98 - 111 mmol/L 104  106  104   CO2 22 - 32 mmol/L '24  24  24   '$ Calcium 8.9 - 10.3 mg/dL 7.7  7.4  7.5   Total Protein 6.5 - 8.1 g/dL 5.9  5.7  6.0   Total Bilirubin 0.3 - 1.2 mg/dL 0.3  0.4  0.3   Alkaline Phos 38 - 126 U/L 68  52  52   AST 15 - 41 U/L '20  17  21   '$ ALT 0 - 44 U/L '12  13  14      '$ Imaging studies:  No new imaging studies   Assessment/Plan: 56 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  - New 07/26: Nothing new today, consider [name] pouch change tomorrow. If no issues, could wait until  Friday (07/28)  - Wound Care: [name] pouch in place with red rubber present to manage drainage. Anticipate fluid will pool in the wound bed while she is in bed. This has become semi-solid stool. It is okay to adjust the red rubber catheter from time to time to help with drainage while in bed but she remain in dependent portion of [name]. This can be removed from suction to ambulate and work with therapies. These pouches need changed every 7-10 days (ideally). Changed 07/17; next change 07/27 if no issues in interim   - Monitor right upper extremity; no pain; continue prophylactic anticoagulation. May need repeat US next week.  - Continue CLD in effort to thin up effluent + nutritional supplementation - Continue TPN at goal; monitor electrolytes  - Monitor abdominal examination; on-going bowel function  - Pain control prn; antemetic prn  - Out of bed; discharged from therapies; no recommendations at this  time   - Discharge Planning: Anticipate lengthy admission and potential eventual transfer   All of the above findings and recommendations were discussed with the patient, and the medical team, and all of patient's questions were answered to her expressed satisfaction.  -- [name], PA-C [facility] 04/02/2022, 7:14 AM M-F: 7am - 4pm

## 2022-04-02 NOTE — Progress Notes (Addendum)
Nutrition Follow-up  DOCUMENTATION CODES:   Obesity unspecified  INTERVENTION:   Continue TPN per pharmacy- provides 1872kcal/day and 106g/day protein   Boost Breeze po TID, each supplement provides 250 kcal and 9 grams of protein  Daily weights   NUTRITION DIAGNOSIS:   Increased nutrient needs related to wound healing, catabolic illness as evidenced by estimated needs.  GOAL:   Patient will meet greater than or equal to 90% of their needs -met with TPN   MONITOR:   PO intake, Supplement acceptance, Labs, Weight trends, Diet advancement, I & O's, TPN  ASSESSMENT:   56 y/o female with h/o hypothyroidism, COVID 19 (3/21), kidney stones and stage 3 colon cancer (s/p left hemicolectomy 5/21 and chemotherapy) who is admitted for new pelvic mass now s/p laparotomy 6/8 (with excision of pelvic mass from greater omentum, abdominal wall reconstruction with bilateral myocutaneous flaps and mesh, incisional hernia repair, appendectomy repair and VAC placement) complicated by bowel perforation s/p reopening of recent laparotomy 6/10 (with repair of small bowel perforation, excision of mesh, placement of two phasix mesh and VAC placement). Pathology returned as metastatic adenocarcinoma.   Pt tolerating TPN at goal rate. Refeed labs stable. Triglycerides wnl. Hyperglycemia improved with insulin in TPN. Pt remains on clear liquid diet. Pt with poor oral intake; pt eating ~10% of her meals. Pt is being offered [employer]. Per chart, pt appears weight stable since admission. [name] pouch with 1170m output. Pt may require prolonged TPN per MD note.    Medications reviewed and include: lovenox, heparin, insulin, L-glutamine, synthroid, imodium, MVI, protonix, carafate  Labs reviewed: Na 132(L), K 3.9 wnl, creat 0.36(L), P 3.3 wnl, Mg 1.9 wnl Triglycerides- 66- 7/24 Hgb 7.5(L), Hct 24.2(L)- 7/24 Cbgs- 133, 157 x 24 hrs  Diet Order:   Diet Order             Diet clear liquid Room service  appropriate? Yes; Fluid consistency: Thin  Diet effective now                  EDUCATION NEEDS:   Not appropriate for education at this time  Skin:  Skin Assessment: Reviewed RN Assessment (surgical abdominal wound 15.5 cm x 14.6 cm x 7 cm)  Last BM:  7/24  Height:   Ht Readings from Last 1 Encounters:  03/01/22 _0  (1.499 m)    Weight:   Wt Readings from Last 1 Encounters:  04/02/22 89.4 kg    Ideal Body Weight:  44.3 kg  BMI:  Body mass index is 39.81 kg/m.  Estimated Nutritional Needs:   Kcal:  1800-2100kcal/day  Protein:  90-110g/d  Fluid:  1.4-1.6L/day  CKoleen DistanceMS, RD, LDN Please refer to [facility]for RD and/or RD on-call/weekend/after hours pager

## 2022-04-02 NOTE — Progress Notes (Signed)
PHARMACY - TOTAL PARENTERAL NUTRITION CONSULT NOTE   Indication: Prolonged ileus  Patient Measurements: Height: '4\' 11"'$  (149.9 cm) Weight: 89.4 kg (197 lb 1.5 oz) IBW/kg (Calculated) : 43.2 TPN AdjBW (KG): 55.2 Body mass index is 39.81 kg/m.  Assessment:  56 y.o. female s/p laparotomy, excision of greater omental mass, abdominal wall reconstruction with [name],appendectomy, and placement of Prevena vac.  Glucose / Insulin: No apparent history of diabetes. BG goal 140 - 180. BG values of 99 ('@2200'$ ) and  157 ('@0900'$ ) preceding 24h (2u SSI required). TPN bag includes 33u of insulin.  Electrolytes: Mild, stable hyponatremia Renal: Scr ~ 0.5 Hepatic: No transaminitis. LFTs within normal limits. TG within normal limits Intake/Output: Total I/O: +2.1 L GI Imaging: GI Surgeries / Procedures: s/p laparotomy, excision of greater omental mass, abdominal wall reconstruction with [name], appendectomy, and placement of Prevena vac  Central access: 02/15/22 TPN start date: 02/15/22  Nutritional Goals: Goal TPN rate of 65 mL/hr (provides 106 g of protein and 1872 kcals per day)  RD Assessment:  Estimated Needs Total Energy Estimated Needs: 1800-2100kcal/day Total Protein Estimated Needs: 90-110g/d Total Fluid Estimated Needs: 1.4-1.6L/day  Current Nutrition:  CLD + nutritional supplements  Plan:  Continue TPN at 15m/hr Nutritional Components Amino acids (using 15% Clinisol): 106 grams Dextrose: 265.2 grams Lipids (using 20% SMOFlipids): 54.6 grams kCal: 1872/24h  Electrolytes in TPN (standard): Na 595m/L, K 7042mL, Ca 5mE93m, Mg 10mE47m and Phos 25 mmol/L. Cl:Ac 1:1 Enteral multivitamin. Add trace elements to TPN Continue moderate SSI TIDM + CBG 4x/day (ACHS) + 30u in TPN Reduced insulin slightly again given isolated BG on the low end of normal. Can give SSI outside of TPN If needed for elevated BG. Monitor TPN labs on Mon/Thurs   CarolWynelle Cleveland/2023 9:44 AM

## 2022-04-03 LAB — COMPREHENSIVE METABOLIC PANEL
ALT: 12 U/L (ref 0–44)
AST: 19 U/L (ref 15–41)
Albumin: 2 g/dL — ABNORMAL LOW (ref 3.5–5.0)
Alkaline Phosphatase: 68 U/L (ref 38–126)
Anion gap: 5 (ref 5–15)
BUN: 17 mg/dL (ref 6–20)
CO2: 24 mmol/L (ref 22–32)
Calcium: 7.9 mg/dL — ABNORMAL LOW (ref 8.9–10.3)
Chloride: 102 mmol/L (ref 98–111)
Creatinine, Ser: 0.42 mg/dL — ABNORMAL LOW (ref 0.44–1.00)
GFR, Estimated: >60 mL/min (ref >60)
Glucose, Bld: 176 mg/dL — ABNORMAL HIGH (ref 70–99)
Potassium: 4.2 mmol/L (ref 3.5–5.1)
Sodium: 131 mmol/L — ABNORMAL LOW (ref 135–145)
Total Bilirubin: 0.2 mg/dL — ABNORMAL LOW (ref 0.3–1.2)
Total Protein: 5.9 g/dL — ABNORMAL LOW (ref 6.5–8.1)

## 2022-04-03 LAB — PHOSPHORUS: Phosphorus: 3.3 mg/dL (ref 2.5–4.6)

## 2022-04-03 LAB — MAGNESIUM: Magnesium: 1.9 mg/dL (ref 1.7–2.4)

## 2022-04-03 LAB — TRIGLYCERIDES: Triglycerides: 62 mg/dL (ref <150)

## 2022-04-03 LAB — GLUCOSE, CAPILLARY
Glucose-Capillary: 162 mg/dL — ABNORMAL HIGH (ref 70–99)
Glucose-Capillary: 163 mg/dL — ABNORMAL HIGH (ref 70–99)
Glucose-Capillary: 158 mg/dL — ABNORMAL HIGH (ref 70–99)
Glucose-Capillary: 129 mg/dL — ABNORMAL HIGH (ref 70–99)

## 2022-04-03 MED ORDER — TRACE MINERALS CU-MN-SE-ZN 300-55-60-3000 MCG/ML IV SOLN
INTRAVENOUS | Status: AC
  Filled 2022-04-03: qty 707.2

## 2022-04-03 NOTE — Progress Notes (Signed)
CC: EC fistula Subjective: NO acute evetns 1lt from fistula  Objective: Vital signs in last 24 hours: Temp:  [98.2 F (36.8 C)-99.3 F (37.4 C)] 99.3 F (37.4 C) (07/27 0753) Pulse Rate:  [79-91] 79 (07/27 0753) Resp:  [16-18] 18 (07/27 0753) BP: (101-121)/(49-78) 101/49 (07/27 0753) SpO2:  [95 %-100 %] 95 % (07/27 0753) Weight:  [91.6 kg-95.6 kg] 91.6 kg (07/27 0623) Last BM Date : 03/31/22  Intake/Output from previous day: 07/26 0701 - 07/27 0700 In: 1338.9 [I.V.:1338.9] Out: 1000 [Drains:1000] Intake/Output this shift: No intake/output data recorded.  Physical exam: alert, cooperative and no distress  Respiratory: breathing non-labored at rest  Gastrointestinal: Soft, abdominal soreness on the right, non-distended, no rebound/guarding. Integumentary: Midline wound open, peritoneum closed with fistulous connection on left lateral aspect and possible in RLQ. Mesh excised. There is feculent drainage present. Red rubbed in better position. [name] pouch without leak.   Lab Results: CBC  No results for input(s): "WBC", "HGB", "HCT", "PLT" in the last 72 hours. BMET Recent Labs    04/03/22 0507  NA 131*  K 4.2  CL 102  CO2 24  GLUCOSE 176*  BUN 17  CREATININE 0.42*  CALCIUM 7.9*   PT/INR No results for input(s): "LABPROT", "INR" in the last 72 hours. ABG No results for input(s): "PHART", "HCO3" in the last 72 hours.  Invalid input(s): "PCO2", "PO2"  Studies/Results: No results found.  Anti-infectives: Anti-infectives (From admission, onward)    Start     Dose/Rate Route Frequency Ordered Stop   03/04/22 2200  piperacillin-tazobactam (ZOSYN) IVPB 3.375 g        3.375 g 12.5 mL/hr over 240 Minutes Intravenous Every 8 hours 03/04/22 2005 03/18/22 0248   02/21/22 1500  vancomycin (VANCOREADY) IVPB 1250 mg/250 mL  Status:  Discontinued        1,250 mg 166.7 mL/hr over 90 Minutes Intravenous Every 24 hours 02/21/22 1111 02/24/22 1336   02/21/22 1400  meropenem  (MERREM) 1 g in sodium chloride 0.9 % 100 mL IVPB        1 g 200 mL/hr over 30 Minutes Intravenous Every 8 hours 02/21/22 1258 02/27/22 2157   02/20/22 1500  vancomycin (VANCOCIN) IVPB 1000 mg/200 mL premix  Status:  Discontinued        1,000 mg 200 mL/hr over 60 Minutes Intravenous Every 24 hours 02/19/22 1706 02/21/22 1111   02/19/22 1115  vancomycin (VANCOREADY) IVPB 2000 mg/400 mL        2,000 mg 200 mL/hr over 120 Minutes Intravenous  Once 02/19/22 1025 02/19/22 1732   02/19/22 1115  fluconazole (DIFLUCAN) IVPB 400 mg        400 mg 100 mL/hr over 120 Minutes Intravenous Every 24 hours 02/19/22 1025 03/04/22 1301   02/15/22 0730  piperacillin-tazobactam (ZOSYN) IVPB 3.375 g  Status:  Discontinued        3.375 g 12.5 mL/hr over 240 Minutes Intravenous Every 8 hours 02/15/22 0639 02/21/22 1235   02/15/22 0655  piperacillin-tazobactam (ZOSYN) 3.375 GM/50ML IVPB       Note to Pharmacy: Glenford Peers N: cabinet override      02/15/22 0655 02/15/22 0816   02/13/22 2200  cefoTEtan (CEFOTAN) 2 g in sodium chloride 0.9 % 100 mL IVPB  Status:  Discontinued        2 g 200 mL/hr over 30 Minutes Intravenous Every 12 hours 02/13/22 1541 02/15/22 0639   02/13/22 1345  cefoTEtan (CEFOTAN) 2 g in sodium chloride 0.9 % 100 mL  IVPB        2 g 200 mL/hr over 30 Minutes Intravenous  Once 02/13/22 1331 02/13/22 2146   02/13/22 1336  sodium chloride 0.9 % with cefoTEtan (CEFOTAN) ADS Med       Note to Pharmacy: Rulon Sera: cabinet override      02/13/22 1336 02/14/22 0144   02/13/22 0620  sodium chloride 0.9 % with cefoTEtan (CEFOTAN) ADS Med       Note to Pharmacy: Norton Blizzard A: cabinet override      02/13/22 0620 02/13/22 0802   02/13/22 0600  cefoTEtan (CEFOTAN) 2 g in sodium chloride 0.9 % 100 mL IVPB        2 g 200 mL/hr over 30 Minutes Intravenous On call to O.R. 02/12/22 2229 02/13/22 2146       Assessment/Plan:  EC fistula [name] to be changed tomorrow PT  Bairoa La Veinticinco, MD, FACS  04/03/2022

## 2022-04-03 NOTE — Progress Notes (Signed)
PHARMACY - TOTAL PARENTERAL NUTRITION CONSULT NOTE   Indication: Prolonged ileus  Patient Measurements: Height: '4\' 11"'$  (149.9 cm) Weight: 91.6 kg (201 lb 15.1 oz) IBW/kg (Calculated) : 43.2 TPN AdjBW (KG): 55.2 Body mass index is 40.79 kg/m.  Assessment:  56 y.o. female s/p laparotomy, excision of greater omental mass, abdominal wall reconstruction with [name],appendectomy, and placement of Prevena vac.  Glucose / Insulin: No apparent history of diabetes. BG goal 140 - 180. BG 133-176 in preceding 24 hours. 9 units of SSI required (2 units 7/25-7/26 > 9 units 7/26-7/27) Electrolytes: Mild, stable hyponatremia Renal: Scr ~ 0.5 Hepatic: No transaminitis. LFTs within normal limits. TG within normal limits Intake/Output: Total I/O: +1.3L Output: -1 L GI Imaging: GI Surgeries / Procedures: s/p laparotomy, excision of greater omental mass, abdominal wall reconstruction with [name], appendectomy, and placement of Prevena vac  Central access: 02/15/22 TPN start date: 02/15/22  Nutritional Goals: Goal TPN rate of 65 mL/hr (provides 106 g of protein and 1872 kcals per day)  RD Assessment:  Estimated Needs Total Energy Estimated Needs: 1800-2100kcal/day Total Protein Estimated Needs: 90-110g/d Total Fluid Estimated Needs: 1.4-1.6L/day  Current Nutrition:  CLD + nutritional supplements (eating ~10% of meals) Thin liquid diet  Plan: Continue TPN at 58m/hr Nutritional Components Amino acids (using 15% Clinisol): 106 grams Dextrose: 265.2 grams Lipids (using 20% SMOFlipids): 54.6 grams kCal: 1872/24h  Electrolytes in TPN (standard): Na 537m/L, K 70106mL, Ca 5mE54m, Mg 10mE2m and Phos 25 mmol/L. Cl:Ac 1:1 Electrolytes stable Enteral multivitamin. Add trace elements to TPN Continue moderate SSI TIDM + CBG 4x/day (ACHS) + 30u in TPN  24-hour BG values within goal and increased after reducing insulin in TPN, which is expected. Continuing current  insulin regimen. Monitor TPN labs on Mon/Thurs  CarolWynelle Cleveland/2023 10:27 AM

## 2022-04-04 LAB — GLUCOSE, CAPILLARY
Glucose-Capillary: 161 mg/dL — ABNORMAL HIGH (ref 70–99)
Glucose-Capillary: 161 mg/dL — ABNORMAL HIGH (ref 70–99)
Glucose-Capillary: 163 mg/dL — ABNORMAL HIGH (ref 70–99)
Glucose-Capillary: 127 mg/dL — ABNORMAL HIGH (ref 70–99)
Glucose-Capillary: 150 mg/dL — ABNORMAL HIGH (ref 70–99)

## 2022-04-04 MED ORDER — TRACE MINERALS CU-MN-SE-ZN 300-55-60-3000 MCG/ML IV SOLN
INTRAVENOUS | Status: AC
  Filled 2022-04-04: qty 707.2

## 2022-04-04 NOTE — Progress Notes (Signed)
CC: EC FISTULA Subjective: 900 CC FISTULA  , [name] pouch cahnged this am , D/W Mrs. Lynita Lombard.  Good granulation w some pieces of mesh on the sides and stomatized fistulas at 12 and 1 oclock; pooling in the base. Objective: Vital signs in last 24 hours: Temp:  [98.3 F (36.8 C)-99 F (37.2 C)] 98.9 F (37.2 C) (07/28 0916) Pulse Rate:  [78-82] 82 (07/28 0916) Resp:  [18-20] 18 (07/28 0916) BP: (102-104)/(47-55) 102/47 (07/28 0916) SpO2:  [94 %-99 %] 98 % (07/28 0916) Weight:  [91.6 kg] 91.6 kg (07/28 0500) Last BM Date : 03/31/22  Intake/Output from previous day: 07/27 0701 - 07/28 0700 In: 1478.3 [I.V.:1478.3] Out: 900 [Drains:900] Intake/Output this shift: Total I/O In: 466.2 [I.V.:466.2] Out: 400 [Drains:400]  Physical exam:  NAD alert Abd: eakin pouch w effluent. No peritonitis   Lab Results: CBC  No results for input(s): "WBC", "HGB", "HCT", "PLT" in the last 72 hours. BMET Recent Labs    04/03/22 0507  NA 131*  K 4.2  CL 102  CO2 24  GLUCOSE 176*  BUN 17  CREATININE 0.42*  CALCIUM 7.9*   PT/INR No results for input(s): "LABPROT", "INR" in the last 72 hours. ABG No results for input(s): "PHART", "HCO3" in the last 72 hours.  Invalid input(s): "PCO2", "PO2"  Studies/Results: No results found.  Anti-infectives: Anti-infectives (From admission, onward)    Start     Dose/Rate Route Frequency Ordered Stop   03/04/22 2200  piperacillin-tazobactam (ZOSYN) IVPB 3.375 g        3.375 g 12.5 mL/hr over 240 Minutes Intravenous Every 8 hours 03/04/22 2005 03/18/22 0248   02/21/22 1500  vancomycin (VANCOREADY) IVPB 1250 mg/250 mL  Status:  Discontinued        1,250 mg 166.7 mL/hr over 90 Minutes Intravenous Every 24 hours 02/21/22 1111 02/24/22 1336   02/21/22 1400  meropenem (MERREM) 1 g in sodium chloride 0.9 % 100 mL IVPB        1 g 200 mL/hr over 30 Minutes Intravenous Every 8 hours 02/21/22 1258 02/27/22 2157   02/20/22 1500  vancomycin (VANCOCIN) IVPB  1000 mg/200 mL premix  Status:  Discontinued        1,000 mg 200 mL/hr over 60 Minutes Intravenous Every 24 hours 02/19/22 1706 02/21/22 1111   02/19/22 1115  vancomycin (VANCOREADY) IVPB 2000 mg/400 mL        2,000 mg 200 mL/hr over 120 Minutes Intravenous  Once 02/19/22 1025 02/19/22 1732   02/19/22 1115  fluconazole (DIFLUCAN) IVPB 400 mg        400 mg 100 mL/hr over 120 Minutes Intravenous Every 24 hours 02/19/22 1025 03/04/22 1301   02/15/22 0730  piperacillin-tazobactam (ZOSYN) IVPB 3.375 g  Status:  Discontinued        3.375 g 12.5 mL/hr over 240 Minutes Intravenous Every 8 hours 02/15/22 0639 02/21/22 1235   02/15/22 0655  piperacillin-tazobactam (ZOSYN) 3.375 GM/50ML IVPB       Note to Pharmacy: Glenford Peers N: cabinet override      02/15/22 0655 02/15/22 0816   02/13/22 2200  cefoTEtan (CEFOTAN) 2 g in sodium chloride 0.9 % 100 mL IVPB  Status:  Discontinued        2 g 200 mL/hr over 30 Minutes Intravenous Every 12 hours 02/13/22 1541 02/15/22 0639   02/13/22 1345  cefoTEtan (CEFOTAN) 2 g in sodium chloride 0.9 % 100 mL IVPB        2 g 200  mL/hr over 30 Minutes Intravenous  Once 02/13/22 1331 02/13/22 2146   02/13/22 1336  sodium chloride 0.9 % with cefoTEtan (CEFOTAN) ADS Med       Note to Pharmacy: Rulon Sera: cabinet override      02/13/22 1336 02/14/22 0144   02/13/22 0620  sodium chloride 0.9 % with cefoTEtan (CEFOTAN) ADS Med       Note to Pharmacy: Norton Blizzard A: cabinet override      02/13/22 0620 02/13/22 0802   02/13/22 0600  cefoTEtan (CEFOTAN) 2 g in sodium chloride 0.9 % 100 mL IVPB        2 g 200 mL/hr over 30 Minutes Intravenous On call to O.R. 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 [name] to be changed tomorrow [place], MD,  [facility]  04/04/2022

## 2022-04-04 NOTE — Consult Note (Signed)
[place] Nurse fistula consult note Fistula type/location: EC fistula; large abdominal wound with several stomatized fistulas at 1 o'clock and 2 o'clock  Perifistula assessment: intact, cleansed thoroughly today, adhesive remover used Treatment options for perifistula skin: lined wound edges with [name] material Output liquid brown, some bleeding of the wound bed from the suction Fistula pouching:  Large [name] pouch placed; new pattern drawn.  Pattern left in patient's room for use with next pouch change  Midline wound: 15cm x 10cm x 6cm  Pooling of effluent at the most distal aspect of the wound bed Exposed mesh at wound edges (large portion has been trimmed away per surgery Granular base 71F Red rubber cath with 3 extra openings cut in catheter for extra drainage placed into the spigot end of the pouch and secured with waterproof tape.  LWS at 44-60 mmHG applied, placed catheter along the distal base of the pouch, not in wound bed due to bleeding noted. Ok to move catheter as needed for adequate drainage.   [facility] nursing team will follow along for support with fistula care and teaching  Change [name] every 7-10 days unless leakage occurs  [place], [place], [place]

## 2022-04-04 NOTE — TOC Progression Note (Signed)
Transition of Care [facility]) - Progression Note    Patient Details  Name: Debra [name] MRN: [id number] Date of Birth: 1966-07-23  Transition of Care [facility]) CM/SW [place], LCSW Phone Number: 04/04/2022, 11:54 AM  Clinical Narrative: TOC continues to follow progress and monitor for disposition needs.    Expected Discharge Plan: [place] Barriers to Discharge: Continued Medical Work up  Expected Discharge Plan and Services Expected Discharge Plan: [place]                                               Social Determinants of Health (SDOH) Interventions    Readmission Risk Interventions     No data to display

## 2022-04-04 NOTE — Progress Notes (Signed)
PHARMACY - TOTAL PARENTERAL NUTRITION CONSULT NOTE   Indication: Prolonged ileus  Patient Measurements: Height: '4\' 11"'$  (149.9 cm) Weight: 91.6 kg (201 lb 15.1 oz) IBW/kg (Calculated) : 43.2 TPN AdjBW (KG): 55.2 Body mass index is 40.79 kg/m.  Assessment:  56 y.o. female s/p laparotomy, excision of greater omental mass, abdominal wall reconstruction with [name],appendectomy, and placement of Prevena vac.  Glucose / Insulin: No apparent history of diabetes. BG goal 140 - 180. BG 133-176 in preceding 24 hours. 9 units of SSI required (6 units 7/26-7/27 > 8 units 7/27-7/28) Electrolytes: Mild, stable hyponatremia Renal: Scr ~ 0.5 Hepatic: No transaminitis. LFTs within normal limits. TG within normal limits Intake/Output: Total I/O: +3.3L Output: -900 mL GI Imaging: GI Surgeries / Procedures: s/p laparotomy, excision of greater omental mass, abdominal wall reconstruction with [name], appendectomy, and placement of Prevena vac  Central access: 02/15/22 TPN start date: 02/15/22  Nutritional Goals: Goal TPN rate of 65 mL/hr (provides 106 g of protein and 1872 kcals per day)  RD Assessment:  Estimated Needs Total Energy Estimated Needs: 1800-2100kcal/day Total Protein Estimated Needs: 90-110g/d Total Fluid Estimated Needs: 1.4-1.6L/day  Current Nutrition:  CLD + nutritional supplements (eating ~10% of meals) Thin liquid diet  Plan: Continue TPN at 82m/hr Nutritional Components Amino acids (using 15% Clinisol): 106 grams Dextrose: 265.2 grams Lipids (using 20% SMOFlipids): 54.6 grams kCal: 1872/24h  Electrolytes in TPN (standard): Na 532m/L, K 7034mL, Ca 5mE33m, Mg 10mE34m and Phos 25 mmol/L. Cl:Ac 1:1 Electrolytes stable Enteral multivitamin. Add trace elements to TPN Continue moderate SSI TIDM + CBG 4x/day (ACHS) + 30u in TPN  24-hour BG values within goal and increased after reducing insulin in TPN, which is expected. Continuing current  insulin regimen. Monitor TPN labs on Mon/Thurs  [facility]aks[facility] 04/04/2022 10:54 AM

## 2022-04-05 LAB — GLUCOSE, CAPILLARY
Glucose-Capillary: 156 mg/dL — ABNORMAL HIGH (ref 70–99)
Glucose-Capillary: 147 mg/dL — ABNORMAL HIGH (ref 70–99)
Glucose-Capillary: 133 mg/dL — ABNORMAL HIGH (ref 70–99)
Glucose-Capillary: 104 mg/dL — ABNORMAL HIGH (ref 70–99)

## 2022-04-05 MED ORDER — ALBUMIN HUMAN 5 % IV SOLN
25.0000 g | Freq: Once | INTRAVENOUS | Status: AC
  Administered 2022-04-05: 25 g via INTRAVENOUS
  Filled 2022-04-05: qty 500

## 2022-04-05 MED ORDER — TRACE MINERALS CU-MN-SE-ZN 300-55-60-3000 MCG/ML IV SOLN
INTRAVENOUS | Status: AC
  Filled 2022-04-05: qty 707.2

## 2022-04-05 MED ORDER — SODIUM CHLORIDE 0.9 % IV BOLUS
500.0000 mL | Freq: Once | INTRAVENOUS | Status: AC
  Administered 2022-04-05: 500 mL via INTRAVENOUS

## 2022-04-05 NOTE — Progress Notes (Signed)
PHARMACY - TOTAL PARENTERAL NUTRITION CONSULT NOTE   Indication: Prolonged ileus  Patient Measurements: Height: '4\' 11"'$  (149.9 cm) Weight: 93 kg (205 lb 0.4 oz) IBW/kg (Calculated) : 43.2 TPN AdjBW (KG): 55.2 Body mass index is 41.41 kg/m.  Assessment:  56 y.o. female s/p laparotomy, excision of greater omental mass, abdominal wall reconstruction with [name],appendectomy, and placement of Prevena vac.  Glucose / Insulin: No apparent history of diabetes. BG goal 140 - 180. BG 133-176 in preceding 24 hours. 9 units of SSI required (8 units 7/27-7/28 > 8 units 7/28-7/29) Electrolytes: Mild, stable hyponatremia Renal: Scr ~ 0.5 Hepatic: No transaminitis. LFTs within normal limits. TG within normal limits Intake/Output: Total I/O: +4.6L Output: -900 mL GI Imaging: GI Surgeries / Procedures: s/p laparotomy, excision of greater omental mass, abdominal wall reconstruction with [name], appendectomy, and placement of Prevena vac  Central access: 02/15/22 TPN start date: 02/15/22  Nutritional Goals: Goal TPN rate of 65 mL/hr (provides 106 g of protein and 1872 kcals per day)  RD Assessment:  Estimated Needs Total Energy Estimated Needs: 1800-2100kcal/day Total Protein Estimated Needs: 90-110g/d Total Fluid Estimated Needs: 1.4-1.6L/day  Current Nutrition:  CLD + nutritional supplements (eating ~10% of meals) Thin liquid diet  Plan: Continue TPN at 1m/hr Nutritional Components Amino acids (using 15% Clinisol): 106 grams Dextrose: 265.2 grams Lipids (using 20% SMOFlipids): 54.6 grams kCal: 1872/24h  Electrolytes in TPN (standard): Na 551m/L, K 7034mL, Ca 5mE94m, Mg 10mE33m and Phos 25 mmol/L. Cl:Ac 1:1 Electrolytes stable Enteral multivitamin. Add trace elements to TPN Continue moderate SSI TIDM + CBG 4x/day (ACHS) + 30u in TPN  24-hour BG values within goal and increased after reducing insulin in TPN, which is expected. Continuing current  insulin regimen. Monitor TPN labs on Mon/Thurs  WalidPearla Dubonnet/2023 7:48 AM

## 2022-04-05 NOTE — Progress Notes (Signed)
   04/05/22 0737  Assess: MEWS Score  Temp 98.9 F (37.2 C)  BP (!) 75/37  MAP (mmHg) (!) 50  Pulse Rate 78  Resp 20  Level of Consciousness Alert  SpO2 97 %  O2 Device Room Air  Patient Activity (if Appropriate) In bed  O2 Flow Rate (L/min) 0 L/min  Assess: MEWS Score  MEWS Temp 0  MEWS Systolic 2  MEWS Pulse 0  MEWS RR 0  MEWS LOC 0  MEWS Score 2  MEWS Score Color Yellow  Assess: if the MEWS score is Yellow or Red  Were vital signs taken at a resting state? Yes  Focused Assessment Change from prior assessment (see assessment flowsheet)  Does the patient meet 2 or more of the SIRS criteria? No  Does the patient have a confirmed or suspected source of infection? No  Provider and Rapid Response Notified? Yes  MEWS guidelines implemented *See Row Information* No, other (Comment)  Treat  MEWS Interventions Administered scheduled meds/treatments (given Bolus and IV albumin)  Pain Scale 0-10  Pain Score 0  Patients response to intervention Effective  Take Vital Signs  Increase Vital Sign Frequency  Yellow: Q 2hr X 2 then Q 4hr X 2, if remains yellow, continue Q 4hrs  Escalate  MEWS: Escalate Yellow: discuss with charge nurse/RN and consider discussing with provider and RRT  Notify: Charge Nurse/RN  Name of Charge Nurse/RN Notified [place]  Date Charge Nurse/RN Notified 04/05/22  Time Charge Nurse/RN Notified [phone]  Notify: Provider  Provider Name/Title Dr. Dahlia Byes  Date Provider Notified 04/05/22  Time Provider Notified [phone]  Method of Notification Page  Notification Reason Other (Comment)  Provider response See new orders (received new order for bolus and albumin infusion)  Date of Provider Response 04/05/22  Time of Provider Response [phone]  Document  Patient Outcome Stabilized after interventions  Progress note created (see row info) Yes  Assess: SIRS CRITERIA  SIRS Temperature  0  SIRS Pulse 0  SIRS Respirations  0  SIRS WBC 1  SIRS Score Sum  1

## 2022-04-05 NOTE — Progress Notes (Signed)
04/05/2022  Subjective: Patient is 49 Days Post-Op.  Patient has some mild hypotension today which responded to a bolus of albumin.  She has been ambulating this afternoon without any symptoms.  Vital signs: Temp:  [98.1 F (36.7 C)-99.8 F (37.7 C)] 98.9 F (37.2 C) (07/29 0737) Pulse Rate:  [78-89] 81 (07/29 1011) Resp:  [15-20] 18 (07/29 1011) BP: (75-109)/(37-73) 109/60 (07/29 1011) SpO2:  [97 %-100 %] 100 % (07/29 1011) Weight:  [93 kg] 93 kg (07/29 0500)   Intake/Output: 07/28 0701 - 07/29 0700 In: 1593.5 [I.V.:1593.5] Out: 1050 [Drains:1050] Last BM Date : 03/31/22  Physical Exam: Constitutional: No acute distress Abdomen: Soft, nondistended, nontender to palpation.  Midline has [name] pouch in place with no leaks with some liquid stool at the base of the pouch.  Labs:  No results for input(s): "WBC", "HGB", "HCT", "PLT" in the last 72 hours. Recent Labs    04/03/22 0507  NA 131*  K 4.2  CL 102  CO2 24  GLUCOSE 176*  BUN 17  CREATININE 0.42*  CALCIUM 7.9*   No results for input(s): "LABPROT", "INR" in the last 72 hours.  Imaging: No results found.  Assessment/Plan: This is a 56 y.o. female s/p  s/p re-opening of laparotomy for repair of small bowel perforation and 14 days s/p laparotomy, excision of greater omental mass, abdominal wall reconstruction with [name],appendectomy, and placement of Prevena vac.  -Patient responded well to albumin boluses.  Her blood pressure more recently is 100/57 up from 75/37 before the boluses.  She denies any dizziness and was able to ambulate without complications.  We will continue to monitor.  This may be related to malnutrition. - Continue medications to slow her bowels in order to decrease the output. - Clear liquids as tolerated.   Melvyn Neth, [facility]

## 2022-04-06 LAB — GLUCOSE, CAPILLARY
Glucose-Capillary: 125 mg/dL — ABNORMAL HIGH (ref 70–99)
Glucose-Capillary: 128 mg/dL — ABNORMAL HIGH (ref 70–99)
Glucose-Capillary: 170 mg/dL — ABNORMAL HIGH (ref 70–99)
Glucose-Capillary: 167 mg/dL — ABNORMAL HIGH (ref 70–99)
Glucose-Capillary: 134 mg/dL — ABNORMAL HIGH (ref 70–99)

## 2022-04-06 MED ORDER — TRACE MINERALS CU-MN-SE-ZN 300-55-60-3000 MCG/ML IV SOLN
INTRAVENOUS | Status: AC
  Filled 2022-04-06: qty 707.2

## 2022-04-06 NOTE — Progress Notes (Signed)
PHARMACY - TOTAL PARENTERAL NUTRITION CONSULT NOTE   Indication: Prolonged ileus  Patient Measurements: Height: '4\' 11"'$  (149.9 cm) Weight: 93 kg (205 lb 0.4 oz) IBW/kg (Calculated) : 43.2 TPN AdjBW (KG): 55.2 Body mass index is 41.41 kg/m.  Assessment:  56 y.o. female s/p laparotomy, excision of greater omental mass, abdominal wall reconstruction with [name],appendectomy, and placement of Prevena vac.  Glucose / Insulin: No apparent history of diabetes. BG goal 140 - 180. BG 133-176 in preceding 24 hours. 9 units of SSI required (8 units 7/28-7/29 > 7 units 7/28-7/29) Electrolytes: Mild, stable hyponatremia Renal: Scr ~ 0.5 Hepatic: No transaminitis. LFTs within normal limits. TG within normal limits Intake/Output: Total I/O: +5.1L Output: -300 mL GI Imaging: GI Surgeries / Procedures: s/p laparotomy, excision of greater omental mass, abdominal wall reconstruction with [name], appendectomy, and placement of Prevena vac  Central access: 02/15/22 TPN start date: 02/15/22  Nutritional Goals: Goal TPN rate of 65 mL/hr (provides 106 g of protein and 1872 kcals per day)  RD Assessment:  Estimated Needs Total Energy Estimated Needs: 1800-2100kcal/day Total Protein Estimated Needs: 90-110g/d Total Fluid Estimated Needs: 1.4-1.6L/day  Current Nutrition:  CLD + nutritional supplements (eating ~10% of meals) Thin liquid diet  Plan: Continue TPN at 31m/hr Nutritional Components Amino acids (using 15% Clinisol): 106 grams Dextrose: 265.2 grams Lipids (using 20% SMOFlipids): 54.6 grams kCal: 1872/24h  Electrolytes in TPN (standard): Na 551m/L, K 7059mL, Ca 5mE1m, Mg 10mE17m and Phos 25 mmol/L. Cl:Ac 1:1 Electrolytes stable Enteral multivitamin. Add trace elements to TPN Continue moderate SSI TIDM + CBG 4x/day (ACHS) + 30u in TPN  24-hour BG values within goal and increased after reducing insulin in TPN, which is expected. Continuing current  insulin regimen. Monitor TPN labs on Mon/Thurs  Elliett Guarisco A [facility] 04/06/2022 9:30 AM

## 2022-04-06 NOTE — Progress Notes (Signed)
04/06/2022  Subjective: Patient is 50 Days Post-Op.  No acute events overnight.  Yesterday, the patient's blood pressure was low but responded to albumin bolus.  This morning she has been ambulating and denies any dizziness or pain.  Vital signs: Temp:  [98.4 F (36.9 C)-98.7 F (37.1 C)] 98.6 F (37 C) (07/30 0817) Pulse Rate:  [73-79] 74 (07/30 0817) Resp:  [16-18] 18 (07/30 0817) BP: (91-100)/(46-57) 91/53 (07/30 0817) SpO2:  [98 %-100 %] 100 % (07/30 0817)   Intake/Output: 07/29 0701 - 07/30 0700 In: 1692.5 [P.O.:240; I.V.:1332.5] Out: 1450 [Urine:300; Drains:1150] Last BM Date : 03/31/22  Physical Exam: Constitutional: No acute distress Abdomen: Soft, nondistended, nontender to palpation.  Exam pelvis in stable position with red rubber catheter contained within the dressing.  Labs:  No results for input(s): "WBC", "HGB", "HCT", "PLT" in the last 72 hours. No results for input(s): "NA", "K", "CL", "CO2", "GLUCOSE", "BUN", "CREATININE", "CALCIUM" in the last 72 hours.  Invalid input(s): "MAGNESIUM" No results for input(s): "LABPROT", "INR" in the last 72 hours.  Imaging: No results found.  Assessment/Plan: This is a 55 y.o. female s/p re-opening of laparotomy for repair of small bowel perforation and 14 days s/p laparotomy, excision of greater omental mass, abdominal wall reconstruction with [name],appendectomy, and placement of Prevena vac.  -Currently patient is doing well without any complaints.  Denies any further dizziness.  Her blood pressure is stable although soft which has been throughout.  We will continue to monitor and do albumin boluses as needed. - Continue clear liquid diet, TPN, Imodium.   Melvyn Neth, [facility]

## 2022-04-07 LAB — CBC
HCT: 22.3 % — ABNORMAL LOW (ref 36.0–46.0)
Hemoglobin: 6.9 g/dL — ABNORMAL LOW (ref 12.0–15.0)
MCH: 25.1 pg — ABNORMAL LOW (ref 26.0–34.0)
MCHC: 30.9 g/dL (ref 30.0–36.0)
MCV: 81.1 fL (ref 80.0–100.0)
Platelets: 147 10*3/uL — ABNORMAL LOW (ref 150–400)
RBC: 2.75 MIL/uL — ABNORMAL LOW (ref 3.87–5.11)
RDW: 16.1 % — ABNORMAL HIGH (ref 11.5–15.5)
WBC: 3.8 10*3/uL — ABNORMAL LOW (ref 4.0–10.5)
nRBC: 0 % (ref 0.0–0.2)

## 2022-04-07 LAB — COMPREHENSIVE METABOLIC PANEL
ALT: 12 U/L (ref 0–44)
AST: 19 U/L (ref 15–41)
Albumin: 2.1 g/dL — ABNORMAL LOW (ref 3.5–5.0)
Alkaline Phosphatase: 78 U/L (ref 38–126)
Anion gap: 4 — ABNORMAL LOW (ref 5–15)
BUN: 16 mg/dL (ref 6–20)
CO2: 24 mmol/L (ref 22–32)
Calcium: 7.7 mg/dL — ABNORMAL LOW (ref 8.9–10.3)
Chloride: 103 mmol/L (ref 98–111)
Creatinine, Ser: 0.4 mg/dL — ABNORMAL LOW (ref 0.44–1.00)
GFR, Estimated: >60 mL/min (ref >60)
Glucose, Bld: 132 mg/dL — ABNORMAL HIGH (ref 70–99)
Potassium: 4.1 mmol/L (ref 3.5–5.1)
Sodium: 131 mmol/L — ABNORMAL LOW (ref 135–145)
Total Bilirubin: 0.5 mg/dL (ref 0.3–1.2)
Total Protein: 5.8 g/dL — ABNORMAL LOW (ref 6.5–8.1)

## 2022-04-07 LAB — GLUCOSE, CAPILLARY
Glucose-Capillary: 132 mg/dL — ABNORMAL HIGH (ref 70–99)
Glucose-Capillary: 145 mg/dL — ABNORMAL HIGH (ref 70–99)
Glucose-Capillary: 88 mg/dL (ref 70–99)
Glucose-Capillary: 130 mg/dL — ABNORMAL HIGH (ref 70–99)

## 2022-04-07 LAB — PREPARE RBC (CROSSMATCH)

## 2022-04-07 LAB — PHOSPHORUS: Phosphorus: 3.6 mg/dL (ref 2.5–4.6)

## 2022-04-07 LAB — TRIGLYCERIDES: Triglycerides: 55 mg/dL (ref <150)

## 2022-04-07 LAB — MAGNESIUM: Magnesium: 1.8 mg/dL (ref 1.7–2.4)

## 2022-04-07 MED ORDER — DIPHENHYDRAMINE HCL 25 MG PO CAPS
25.0000 mg | ORAL_CAPSULE | Freq: Once | ORAL | Status: AC
Start: 2022-04-07 — End: 2022-04-07
  Administered 2022-04-07: 25 mg via ORAL
  Filled 2022-04-07: qty 1

## 2022-04-07 MED ORDER — ALBUMIN HUMAN 25 % IV SOLN
25.0000 g | Freq: Once | INTRAVENOUS | Status: AC
  Administered 2022-04-07: 25 g via INTRAVENOUS
  Filled 2022-04-07: qty 100

## 2022-04-07 MED ORDER — TRACE MINERALS CU-MN-SE-ZN 300-55-60-3000 MCG/ML IV SOLN
INTRAVENOUS | Status: AC
  Filled 2022-04-07: qty 707.2

## 2022-04-07 MED ORDER — SODIUM CHLORIDE 0.9% IV SOLUTION: Freq: Once | INTRAVENOUS | Status: AC

## 2022-04-07 MED ORDER — ACETAMINOPHEN 325 MG PO TABS
650.0000 mg | ORAL_TABLET | Freq: Once | ORAL | Status: AC
Start: 2022-04-07 — End: 2022-04-07
  Administered 2022-04-07: 650 mg via ORAL
  Filled 2022-04-07: qty 2

## 2022-04-07 NOTE — Progress Notes (Signed)
PHARMACY - TOTAL PARENTERAL NUTRITION CONSULT NOTE   Indication: Prolonged ileus  Patient Measurements: Height: '4\' 11"'$  (149.9 cm) Weight: 93.4 kg (205 lb 14.6 oz) IBW/kg (Calculated) : 43.2 TPN AdjBW (KG): 55.2 Body mass index is 41.59 kg/m.  Assessment:  56 y.o. female s/p laparotomy, excision of greater omental mass, abdominal wall reconstruction with [name],appendectomy, and placement of Prevena vac.  Glucose / Insulin: No apparent history of diabetes. BG goal 140 - 180. BG 125-170 in preceding 24 hours. 5 units of SSI required Electrolytes: Mild, stable hyponatremia Renal: Scr ~ 0.4 Hepatic: No transaminitis. LFTs within normal limits. TG within normal limits Intake/Output: Total I/O: ~even Output:   GI Imaging: GI Surgeries / Procedures: s/p laparotomy, excision of greater omental mass, abdominal wall reconstruction with [name], appendectomy, and placement of Prevena vac  Central access: 02/15/22 TPN start date: 02/15/22  Nutritional Goals: Goal TPN rate of 65 mL/hr (provides 106 g of protein and 1872 kcals per day)  RD Assessment:  Estimated Needs Total Energy Estimated Needs: 1800-2100kcal/day Total Protein Estimated Needs: 90-110g/d Total Fluid Estimated Needs: 1.4-1.6L/day  Current Nutrition:  CLD + nutritional supplements (eating ~10% of meals) Thin liquid diet  Plan: Continue TPN at 73m/hr Nutritional Components Amino acids (using 15% Clinisol): 106 grams Dextrose: 265.2 grams Lipids (using 20% SMOFlipids): 54.6 grams kCal: 1872/24h  Electrolytes in TPN (standard): Na 550m/L, K 7014mL, Ca 5mE49m, Mg 10mE69m and Phos 25 mmol/L. Cl:Ac 1:1 Electrolytes stable Enteral multivitamin. Add trace elements to TPN Continue moderate SSI TIDM + CBG 4x/day (ACHS) + 30u in TPN  24-hour BG values within goal and increased after reducing insulin in TPN, which is expected. Continuing current insulin regimen. Monitor TPN labs on  Mon/Thurs  Jarron Curley Paulina FusirmD, BCPS 04/07/2022 9:58 AM

## 2022-04-07 NOTE — Progress Notes (Signed)
CC: EC fistula Subjective: Walked over the weekend, soft BP Hb less than 7 and symptomatic, very weak and lightheaded Bmp nml Objective: Vital signs in last 24 hours: Temp:  [98.5 F (36.9 C)-98.8 F (37.1 C)] 98.6 F (37 C) (07/31 0829) Pulse Rate:  [73-84] 73 (07/31 0829) Resp:  [17-18] 18 (07/31 0829) BP: (91-105)/(50-66) 91/50 (07/31 0829) SpO2:  [97 %-100 %] 97 % (07/31 0829) Weight:  [93.4 kg] 93.4 kg (07/31 0500) Last BM Date : 03/31/22  Intake/Output from previous day: 07/30 0701 - 07/31 0700 In: 1007.7 [P.O.:360; I.V.:647.7] Out: 1000 [Drains:1000] Intake/Output this shift: Total I/O In: 480 [P.O.:480] Out: -   Physical exam:  NAD alert, pale Abd: eakin pouch in place. Not tender   Lab Results: CBC  Recent Labs    04/07/22 0424  WBC 3.8*  HGB 6.9*  HCT 22.3*  PLT 147*   BMET Recent Labs    04/07/22 0424  NA 131*  K 4.1  CL 103  CO2 24  GLUCOSE 132*  BUN 16  CREATININE 0.40*  CALCIUM 7.7*   PT/INR No results for input(s): "LABPROT", "INR" in the last 72 hours. ABG No results for input(s): "PHART", "HCO3" in the last 72 hours.  Invalid input(s): "PCO2", "PO2"  Studies/Results: No results found.  Anti-infectives: Anti-infectives (From admission, onward)    Start     Dose/Rate Route Frequency Ordered Stop   03/04/22 2200  piperacillin-tazobactam (ZOSYN) IVPB 3.375 g        3.375 g 12.5 mL/hr over 240 Minutes Intravenous Every 8 hours 03/04/22 2005 03/18/22 0248   02/21/22 1500  vancomycin (VANCOREADY) IVPB 1250 mg/250 mL  Status:  Discontinued        1,250 mg 166.7 mL/hr over 90 Minutes Intravenous Every 24 hours 02/21/22 1111 02/24/22 1336   02/21/22 1400  meropenem (MERREM) 1 g in sodium chloride 0.9 % 100 mL IVPB        1 g 200 mL/hr over 30 Minutes Intravenous Every 8 hours 02/21/22 1258 02/27/22 2157   02/20/22 1500  vancomycin (VANCOCIN) IVPB 1000 mg/200 mL premix  Status:  Discontinued        1,000 mg 200 mL/hr over 60  Minutes Intravenous Every 24 hours 02/19/22 1706 02/21/22 1111   02/19/22 1115  vancomycin (VANCOREADY) IVPB 2000 mg/400 mL        2,000 mg 200 mL/hr over 120 Minutes Intravenous  Once 02/19/22 1025 02/19/22 1732   02/19/22 1115  fluconazole (DIFLUCAN) IVPB 400 mg        400 mg 100 mL/hr over 120 Minutes Intravenous Every 24 hours 02/19/22 1025 03/04/22 1301   02/15/22 0730  piperacillin-tazobactam (ZOSYN) IVPB 3.375 g  Status:  Discontinued        3.375 g 12.5 mL/hr over 240 Minutes Intravenous Every 8 hours 02/15/22 0639 02/21/22 1235   02/15/22 0655  piperacillin-tazobactam (ZOSYN) 3.375 GM/50ML IVPB       Note to Pharmacy: Glenford Peers N: cabinet override      02/15/22 0655 02/15/22 0816   02/13/22 2200  cefoTEtan (CEFOTAN) 2 g in sodium chloride 0.9 % 100 mL IVPB  Status:  Discontinued        2 g 200 mL/hr over 30 Minutes Intravenous Every 12 hours 02/13/22 1541 02/15/22 0639   02/13/22 1345  cefoTEtan (CEFOTAN) 2 g in sodium chloride 0.9 % 100 mL IVPB        2 g 200 mL/hr over 30 Minutes Intravenous  Once 02/13/22 1331 02/13/22  2146   02/13/22 1336  sodium chloride 0.9 % with cefoTEtan (CEFOTAN) ADS Med       Note to Pharmacy: Rulon Sera: cabinet override      02/13/22 1336 02/14/22 0144   02/13/22 0620  sodium chloride 0.9 % with cefoTEtan (CEFOTAN) ADS Med       Note to Pharmacy: Norton Blizzard A: cabinet override      02/13/22 0620 02/13/22 0802   02/13/22 0600  cefoTEtan (CEFOTAN) 2 g in sodium chloride 0.9 % 100 mL IVPB        2 g 200 mL/hr over 30 Minutes Intravenous On call to O.R. 02/12/22 2229 02/13/22 2146       Assessment/Plan: Symptomatic anemia We will transfuse 1 unit rbc Continue tpn and wound care   Caroleen Hamman, MD, FACS  04/07/2022

## 2022-04-08 LAB — CBC
HCT: 26.8 % — ABNORMAL LOW (ref 36.0–46.0)
Hemoglobin: 8.5 g/dL — ABNORMAL LOW (ref 12.0–15.0)
MCH: 26.2 pg (ref 26.0–34.0)
MCHC: 31.7 g/dL (ref 30.0–36.0)
MCV: 82.5 fL (ref 80.0–100.0)
Platelets: 132 10*3/uL — ABNORMAL LOW (ref 150–400)
RBC: 3.25 MIL/uL — ABNORMAL LOW (ref 3.87–5.11)
RDW: 16 % — ABNORMAL HIGH (ref 11.5–15.5)
WBC: 4 10*3/uL (ref 4.0–10.5)
nRBC: 0 % (ref 0.0–0.2)

## 2022-04-08 LAB — GLUCOSE, CAPILLARY
Glucose-Capillary: 139 mg/dL — ABNORMAL HIGH (ref 70–99)
Glucose-Capillary: 116 mg/dL — ABNORMAL HIGH (ref 70–99)

## 2022-04-08 MED ORDER — TRACE MINERALS CU-MN-SE-ZN 300-55-60-3000 MCG/ML IV SOLN
INTRAVENOUS | Status: AC
  Filled 2022-04-08: qty 707.2

## 2022-04-08 NOTE — Plan of Care (Signed)
Pt AAOx4, no pain. VS are stable. Received 1 unit of PRBC overnight. Plan for mobility as tolerated. Bed in lowest position, call light within reach. Will continue to monitor.

## 2022-04-08 NOTE — Progress Notes (Signed)
PHARMACY - TOTAL PARENTERAL NUTRITION CONSULT NOTE   Indication: Prolonged ileus  Patient Measurements: Height: '4\' 11"'$  (149.9 cm) Weight: 95 kg (209 lb 7 oz) IBW/kg (Calculated) : 43.2 TPN AdjBW (KG): 55.2 Body mass index is 42.3 kg/m.  Assessment:  56 y.o. female s/p laparotomy, excision of greater omental mass, abdominal wall reconstruction with [name],appendectomy, and placement of Prevena vac.  Glucose / Insulin: No apparent history of diabetes. BG goal 140 - 180. BG 130-140 in preceding 24 hours. 2 units of SSI required Electrolytes: Mild, stable hyponatremia Renal: Scr ~ 0.4 Hepatic: No transaminitis. LFTs within normal limits. TG within normal limits Intake/Output: Total I/O: +1L Output:   GI Imaging: GI Surgeries / Procedures: s/p laparotomy, excision of greater omental mass, abdominal wall reconstruction with [name], appendectomy, and placement of Prevena vac  Central access: 02/15/22 TPN start date: 02/15/22  Nutritional Goals: Goal TPN rate of 65 mL/hr (provides 106 g of protein and 1872 kcals per day)  RD Assessment:  Estimated Needs Total Energy Estimated Needs: 1800-2100kcal/day Total Protein Estimated Needs: 90-110g/d Total Fluid Estimated Needs: 1.4-1.6L/day  Current Nutrition:  CLD + nutritional supplements (eating ~10% of meals) Thin liquid diet  Plan: Continue TPN at 40m/hr Nutritional Components Amino acids (using 15% Clinisol): 106 grams Dextrose: 265.2 grams Lipids (using 20% SMOFlipids): 54.6 grams kCal: 1872/24h  Electrolytes in TPN (standard): Na 559m/L, K 7075mL, Ca 5mE11m, Mg 10mE48m and Phos 25 mmol/L. Cl:Ac 1:1 Electrolytes stable Enteral multivitamin. Add trace elements to TPN Continue moderate SSI TIDM + CBG 4x/day (ACHS) + 30u in TPN  24-hour BG values within goal and increased after reducing insulin in TPN, which is expected. Continuing current insulin regimen. Monitor TPN labs on  Mon/Thurs   AnderDaytonmacist 04/08/2022 10:00 AM

## 2022-04-08 NOTE — Progress Notes (Signed)
Nutrition Follow-up  DOCUMENTATION CODES:   Obesity unspecified  INTERVENTION:   Continue TPN per pharmacy- provides 1872kcal/day and 106g/day protein   Boost Breeze po TID, each supplement provides 250 kcal and 9 grams of protein  Daily weights   NUTRITION DIAGNOSIS:   Increased nutrient needs related to wound healing, catabolic illness as evidenced by estimated needs.  GOAL:   Patient will meet greater than or equal to 90% of their needs -met with TPN   MONITOR:   PO intake, Supplement acceptance, Labs, Weight trends, Diet advancement, I & O's, TPN  ASSESSMENT:   56 y/o female with h/o hypothyroidism, COVID 19 (3/21), kidney stones and stage 3 colon cancer (s/p left hemicolectomy 5/21 and chemotherapy) who is admitted for new pelvic mass now s/p laparotomy 6/8 (with excision of pelvic mass from greater omentum, abdominal wall reconstruction with bilateral myocutaneous flaps and mesh, incisional hernia repair, appendectomy repair and VAC placement) complicated by bowel perforation s/p reopening of recent laparotomy 6/10 (with repair of small bowel perforation, excision of mesh, placement of two phasix mesh and VAC placement). Pathology returned as metastatic adenocarcinoma.   Pt tolerating TPN at goal rate. Refeed labs stable. Triglycerides wnl. Hyperglycemia improved. Pt remains on clear liquid diet. Pt with decreased oral intake. Pt is being offered [employer]. Per chart, pt appears weight stable since admission. [name] pouch with 736m output. Pt may require prolonged TPN per MD note.    Medications reviewed and include: lovenox, insulin, L-glutamine, synthroid, imodium, MVI, protonix, carafate  Labs reviewed: Na 131(L), K 4.1 wnl, creat 0.40(L), P 3.6 wnl, Mg 1.8 wnl Triglycerides- 55- 7/31 Hgb 8.5(L), Hct 26.8(L) Cbgs- 116, 139 x 24 hrs  Diet Order:   Diet Order             Diet clear liquid Room service appropriate? Yes; Fluid consistency: Thin  Diet effective now                   EDUCATION NEEDS:   Not appropriate for education at this time  Skin:  Skin Assessment: Reviewed RN Assessment ( Midline wound: 15cm x 10cm x 6cm )  Last BM:  8/1  Height:   Ht Readings from Last 1 Encounters:  03/01/22 '4\' 11"'  (1.499 m)    Weight:   Wt Readings from Last 1 Encounters:  04/08/22 95 kg    Ideal Body Weight:  44.3 kg  BMI:  Body mass index is 42.3 kg/m.  Estimated Nutritional Needs:   Kcal:  1800-2100kcal/day  Protein:  90-110g/d  Fluid:  1.4-1.6L/day  CKoleen DistanceMS, RD, LDN Please refer to [facility]for RD and/or RD on-call/weekend/after hours pager

## 2022-04-08 NOTE — Progress Notes (Signed)
[place] Hospital Day(s): 54.   Post op day(s): 52 Days Post-Op.   Interval History:  Patient seen and examined No issues overnight No abdominal pain; nausea, emesis, fevers BP still on softer side; trend in last 24 hours 90-102/50-69; most recent 90/69 at 0800 Hemodynamically stable otherwise  Repeat CBC pending; s/p 1 unit pRBC on 07/31 [name] Pouch output 700 ccs in last 24 hours; changed 07/28 She is on CLD; on TPN Working with therapies; recommending [place]   Vital signs in last 24 hours: [min-max] current  Temp:  [97.9 F (36.6 C)-98.7 F (37.1 C)] 98.7 F (37.1 C) (08/01 0419) Pulse Rate:  [70-76] 70 (08/01 0419) Resp:  [16-18] 16 (08/01 0419) BP: (91-113)/(50-64) 100/53 (08/01 0419) SpO2:  [97 %-100 %] 97 % (08/01 0419) Weight:  [95 kg] 95 kg (08/01 0419)     Height: '4\' 11"'$  (149.9 cm) Weight: 95 kg BMI (Calculated): 43.04   Intake/Output last 2 shifts:  07/31 0701 - 08/01 0700 In: 1727.6 [P.O.:510; I.V.:803.6; Blood:330; IV Piggyback:84] Out: 700 [Drains:700]   Physical Exam:  Constitutional: alert, cooperative and no distress  Respiratory: breathing non-labored at rest  Cardiovascular: regular rate and sinus rhythm  Gastrointestinal: Soft, abdominal soreness on the right, non-distended, no rebound/guarding. Integumentary: Midline wound open, peritoneum closed with fistulous connection on left lateral aspect and possible in RLQ. Mesh excised. There is feculent drainage present. Red rubbed in better position. [name] pouch without leak.  MSK: RUE still edematous; non-tender, peripheral pulses 2+  Labs:     Latest Ref Rng & Units 04/07/2022    4:24 AM 03/31/2022    5:46 AM 03/24/2022    5:03 AM  CBC  WBC 4.0 - 10.5 K/uL 3.8  4.2  3.6   Hemoglobin 12.0 - 15.0 g/dL 6.9  7.5  7.4   Hematocrit 36.0 - 46.0 % 22.3  24.2  23.8   Platelets 150 - 400 K/uL 147  137  140       Latest Ref Rng & Units 04/07/2022    4:24 AM 04/03/2022     5:07 AM 03/31/2022    5:46 AM  CMP  Glucose 70 - 99 mg/dL 132  176  152   BUN 6 - 20 mg/dL '16  17  16   '$ Creatinine 0.44 - 1.00 mg/dL 0.40  0.42  0.36   Sodium 135 - 145 mmol/L 131  131  132   Potassium 3.5 - 5.1 mmol/L 4.1  4.2  3.9   Chloride 98 - 111 mmol/L 103  102  104   CO2 22 - 32 mmol/L '24  24  24   '$ Calcium 8.9 - 10.3 mg/dL 7.7  7.9  7.7   Total Protein 6.5 - 8.1 g/dL 5.8  5.9  5.9   Total Bilirubin 0.3 - 1.2 mg/dL 0.5  0.2  0.3   Alkaline Phos 38 - 126 U/L 78  68  68   AST 15 - 41 U/L '19  19  20   '$ ALT 0 - 44 U/L '12  12  12      '$ Imaging studies:  No new imaging studies   Assessment/Plan: 56 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  - New 08/01: Nothing new today; pending repeat CBC; watch  BP  - Wound Care: [name] pouch in place with red rubber present to manage drainage. Anticipate fluid will pool in the wound bed while she is in bed. This has become semi-solid stool. It is okay to adjust the red rubber catheter from time to time to help with drainage while in bed but she remain in dependent portion of [name]. This can be removed from suction to ambulate and work with therapies. These pouches need changed every 7-10 days (ideally). Changed 07/28; next change 08/07 if no issues in interim   - Monitor H&H; transfused 1 unit pRBCs (07/31); pending labs this AM  - Monitor right upper extremity; no pain; continue prophylactic anticoagulation. May need repeat US this week.  - Continue CLD in effort to thin up effluent + nutritional supplementation - Continue TPN at goal; monitor electrolytes  - Monitor abdominal examination; on-going bowel function  - Pain control prn; antemetic prn  - Out of bed; discharged from therapies;  no recommendations at this time   - Discharge Planning: Anticipate lengthy admission and potential eventual transfer   All of the above findings and recommendations were discussed with the patient, and the medical team, and all of patient's questions were answered to her expressed satisfaction.  -- [name], PA-C [facility] 04/08/2022, 7:59 AM M-F: 7am - 4pm

## 2022-04-09 LAB — GLUCOSE, CAPILLARY
Glucose-Capillary: 115 mg/dL — ABNORMAL HIGH (ref 70–99)
Glucose-Capillary: 130 mg/dL — ABNORMAL HIGH (ref 70–99)
Glucose-Capillary: 83 mg/dL (ref 70–99)
Glucose-Capillary: 142 mg/dL — ABNORMAL HIGH (ref 70–99)
Glucose-Capillary: 124 mg/dL — ABNORMAL HIGH (ref 70–99)
Glucose-Capillary: 114 mg/dL — ABNORMAL HIGH (ref 70–99)
Glucose-Capillary: 140 mg/dL — ABNORMAL HIGH (ref 70–99)

## 2022-04-09 MED ORDER — TRACE MINERALS CU-MN-SE-ZN 300-55-60-3000 MCG/ML IV SOLN
INTRAVENOUS | Status: AC
  Filled 2022-04-09: qty 707.2

## 2022-04-09 NOTE — Progress Notes (Signed)
[place] Hospital Day(s): 55.   Post op day(s): 53 Days Post-Op.   Interval History:  Patient seen and examined No issues overnight No abdominal pain; nausea, emesis, fevers BP improved; Hemodynamically stable otherwise  No new labs; TPN labs planned as scheduled tomorrow  [name] Pouch output 1800 ccs in last 24 hours; changed 07/28 She is on CLD; on TPN Ambulating independently   Vital signs in last 24 hours: [min-max] current  Temp:  [97.7 F (36.5 C)-98.9 F (37.2 C)] 98.9 F (37.2 C) (08/02 0437) Pulse Rate:  [66-81] 81 (08/02 0437) Resp:  [16-18] 17 (08/02 0437) BP: (90-134)/(49-69) 118/49 (08/02 0437) SpO2:  [96 %-100 %] 96 % (08/02 0437)     Height: '4\' 11"'$  (149.9 cm) Weight: 95 kg BMI (Calculated): 43.04   Intake/Output last 2 shifts:  08/01 0701 - 08/02 0700 In: 1338.5 [I.V.:1338.5] Out: 1800 [Drains:1800]   Physical Exam:  Constitutional: alert, cooperative and no distress  Respiratory: breathing non-labored at rest  Cardiovascular: regular rate and sinus rhythm  Gastrointestinal: Soft, abdominal soreness on the right, non-distended, no rebound/guarding. Integumentary: Midline wound open, peritoneum closed with fistulous connection on left lateral aspect and possible in RLQ. Mesh excised. There is feculent drainage present. Red rubbed in better position. [name] pouch without leak.  MSK: RUE still edematous; non-tender, peripheral pulses 2+  Labs:     Latest Ref Rng & Units 04/08/2022    8:52 AM 04/07/2022    4:24 AM 03/31/2022    5:46 AM  CBC  WBC 4.0 - 10.5 K/uL 4.0  3.8  4.2   Hemoglobin 12.0 - 15.0 g/dL 8.5  6.9  7.5   Hematocrit 36.0 - 46.0 % 26.8  22.3  24.2   Platelets 150 - 400 K/uL 132  147  137       Latest Ref Rng & Units 04/07/2022    4:24 AM 04/03/2022    5:07 AM 03/31/2022    5:46 AM  CMP  Glucose 70 - 99 mg/dL 132  176  152   BUN 6 - 20 mg/dL '16  17  16   '$ Creatinine 0.44 - 1.00 mg/dL 0.40  0.42  0.36    Sodium 135 - 145 mmol/L 131  131  132   Potassium 3.5 - 5.1 mmol/L 4.1  4.2  3.9   Chloride 98 - 111 mmol/L 103  102  104   CO2 22 - 32 mmol/L '24  24  24   '$ Calcium 8.9 - 10.3 mg/dL 7.7  7.9  7.7   Total Protein 6.5 - 8.1 g/dL 5.8  5.9  5.9   Total Bilirubin 0.3 - 1.2 mg/dL 0.5  0.2  0.3   Alkaline Phos 38 - 126 U/L 78  68  68   AST 15 - 41 U/L '19  19  20   '$ ALT 0 - 44 U/L '12  12  12      '$ Imaging studies:  No new imaging studies   Assessment/Plan: 56 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  - New 08/02: Nothing new today  - Wound Care: [name] pouch in place with red rubber present to manage drainage. Anticipate fluid will pool in the wound bed while she is in bed. This has become semi-solid  stool. It is okay to adjust the red rubber catheter from time to time to help with drainage while in bed but she remain in dependent portion of [name]. This can be removed from suction to ambulate and work with therapies. These pouches need changed every 7-10 days (ideally). Changed 07/28; next change 08/07 if no issues in interim   - Monitor H&H; transfused 1 unit pRBCs (07/31); responded well  - Monitor right upper extremity; no pain; continue prophylactic anticoagulation. May need repeat US this week.  - Continue CLD in effort to thin up effluent + nutritional supplementation - Continue TPN at goal; monitor electrolytes  - Monitor abdominal examination; on-going bowel function  - Pain control prn; antemetic prn  - Out of bed; discharged from therapies; no recommendations at this time   - Discharge Planning: Anticipate lengthy admission and potential eventual transfer   All of the above findings and recommendations were discussed with the  patient, and the medical team, and all of patient's questions were answered to her expressed satisfaction.  -- [name], PA-C  [facility] 04/09/2022, 8:07 AM M-F: 7am - 4pm

## 2022-04-09 NOTE — Plan of Care (Signed)
Pt AAOx4, no pain. VS are WNL. Continue mobility and TPN as tolerated. Bed is in lowest position, call light within reach. Will continue to monitor.

## 2022-04-09 NOTE — Progress Notes (Signed)
PHARMACY - TOTAL PARENTERAL NUTRITION CONSULT NOTE   Indication: Prolonged ileus  Patient Measurements: Height: '4\' 11"'$  (149.9 cm) Weight: 95 kg (209 lb 7 oz) IBW/kg (Calculated) : 43.2 TPN AdjBW (KG): 55.2 Body mass index is 42.3 kg/m.  Assessment:  56 y.o. female s/p laparotomy, excision of greater omental mass, abdominal wall reconstruction with [name],appendectomy, and placement of Prevena vac.  Glucose / Insulin: No apparent history of diabetes. BG goal 140 - 180. BG 83-145 in preceding 24 hours. 8 units of SSI required Electrolytes: Mild, stable hyponatremia Renal: Scr ~ 0.4 Hepatic: No transaminitis. LFTs within normal limits. TG within normal limits Intake/Output: Total I/O: + 1 > 2 L Output: 6.4 L GI Imaging: GI Surgeries / Procedures: s/p laparotomy, excision of greater omental mass, abdominal wall reconstruction with [name], appendectomy, and placement of Prevena vac  Central access: 02/15/22 TPN start date: 02/15/22  Nutritional Goals: Goal TPN rate of 65 mL/hr (provides 106 g of protein and 1872 kcals per day)  RD Assessment:  Estimated Needs Total Energy Estimated Needs: 1800-2100kcal/day Total Protein Estimated Needs: 90-110g/d Total Fluid Estimated Needs: 1.4-1.6L/day  Current Nutrition:  CLD + nutritional supplements (eating ~10% of meals) Thin liquid diet  Plan: Continue TPN at 46m/hr Nutritional Components Amino acids (using 15% Clinisol): 106 grams Dextrose: 265.2 grams Lipids (using 20% SMOFlipids): 54.6 grams kCal: 1872/24h  Electrolytes in TPN (standard): Na 5106m/L, K 7038mL, Ca 5mE31m, Mg 10mE28m and Phos 25 mmol/L. Cl:Ac 1:1 Electrolytes stable Enteral multivitamin. Add trace elements to TPN Continue moderate SSI TIDM + CBG 4x/day (ACHS) + 25u in TPN BG all in range with the exception of an isolated BG of 83 '@1500'$ . Will reduce insulin in TPN to avoid future events of hypoglycemia. For elevated BG, can give  additional insulin via sliding scale. Monitor TPN labs on Mon/Thurs  CarolGlean SalvormD Clinical Pharmacist  04/09/2022 9:33 AM

## 2022-04-10 LAB — COMPREHENSIVE METABOLIC PANEL
ALT: 13 U/L (ref 0–44)
AST: 22 U/L (ref 15–41)
Albumin: 2.8 g/dL — ABNORMAL LOW (ref 3.5–5.0)
Alkaline Phosphatase: 85 U/L (ref 38–126)
Anion gap: 6 (ref 5–15)
BUN: 17 mg/dL (ref 6–20)
CO2: 23 mmol/L (ref 22–32)
Calcium: 8.4 mg/dL — ABNORMAL LOW (ref 8.9–10.3)
Chloride: 103 mmol/L (ref 98–111)
Creatinine, Ser: 0.35 mg/dL — ABNORMAL LOW (ref 0.44–1.00)
GFR, Estimated: >60 mL/min (ref >60)
Glucose, Bld: 143 mg/dL — ABNORMAL HIGH (ref 70–99)
Potassium: 4.2 mmol/L (ref 3.5–5.1)
Sodium: 132 mmol/L — ABNORMAL LOW (ref 135–145)
Total Bilirubin: 0.5 mg/dL (ref 0.3–1.2)
Total Protein: 7.1 g/dL (ref 6.5–8.1)

## 2022-04-10 LAB — GLUCOSE, CAPILLARY
Glucose-Capillary: 146 mg/dL — ABNORMAL HIGH (ref 70–99)
Glucose-Capillary: 128 mg/dL — ABNORMAL HIGH (ref 70–99)
Glucose-Capillary: 120 mg/dL — ABNORMAL HIGH (ref 70–99)
Glucose-Capillary: 119 mg/dL — ABNORMAL HIGH (ref 70–99)

## 2022-04-10 LAB — BPAM RBC
Blood Product Expiration Date: 202309022359
Blood Product Expiration Date: 202309022359
Blood Product Expiration Date: 202309042359
ISSUE DATE / TIME: 202307312100
Unit Type and Rh: 5100
Unit Type and Rh: 5100
Unit Type and Rh: 5100

## 2022-04-10 LAB — TYPE AND SCREEN
ABO/RH(D): O POS
Antibody Screen: POSITIVE
Unit division: 0
Unit division: 0
Unit division: 0

## 2022-04-10 LAB — TRIGLYCERIDES: Triglycerides: 67 mg/dL (ref <150)

## 2022-04-10 LAB — PHOSPHORUS: Phosphorus: 3.7 mg/dL (ref 2.5–4.6)

## 2022-04-10 LAB — MAGNESIUM: Magnesium: 1.9 mg/dL (ref 1.7–2.4)

## 2022-04-10 MED ORDER — TRACE MINERALS CU-MN-SE-ZN 300-55-60-3000 MCG/ML IV SOLN
INTRAVENOUS | Status: AC
  Filled 2022-04-10: qty 707.2

## 2022-04-10 NOTE — Progress Notes (Signed)
[place] Hospital Day(s): 56.   Post op day(s): 54 Days Post-Op.   Interval History:  Patient seen and examined No issues overnight No abdominal pain; nausea, emesis, fevers BP improved; Hemodynamically stable otherwise  Renal function normal; sCr - 0.35; UO - unmeasured No significant electrolyte derangements  [name] Pouch output unmeasured in last 24 hours; changed 07/28 She is on CLD; on TPN Ambulating independently   Vital signs in last 24 hours: [min-max] current  Temp:  [98.6 F (37 C)-99.1 F (37.3 C)] 98.8 F (37.1 C) (08/03 0329) Pulse Rate:  [73-78] 78 (08/03 0329) Resp:  [18] 18 (08/03 0329) BP: (93-121)/(46-55) 104/53 (08/03 0329) SpO2:  [96 %-100 %] 96 % (08/03 0329) Weight:  [94.1 kg] 94.1 kg (08/03 0500)     Height: '4\' 11"'$  (149.9 cm) Weight: 94.1 kg BMI (Calculated): 43.04   Intake/Output last 2 shifts:  08/02 0701 - 08/03 0700 In: 1561.6 [I.V.:1561.6] Out: -    Physical Exam:  Constitutional: alert, cooperative and no distress  Respiratory: breathing non-labored at rest  Cardiovascular: regular rate and sinus rhythm  Gastrointestinal: Soft, abdominal soreness on the right, non-distended, no rebound/guarding. Integumentary: Midline wound open, peritoneum closed with fistulous connection on left lateral aspect and possible in RLQ. Mesh excised. Output is more bilious and thin. Red rubbed in better position. [name] pouch without leak.  MSK: RUE edema improved; non-tender, peripheral pulses 2+  Labs:     Latest Ref Rng & Units 04/08/2022    8:52 AM 04/07/2022    4:24 AM 03/31/2022    5:46 AM  CBC  WBC 4.0 - 10.5 K/uL 4.0  3.8  4.2   Hemoglobin 12.0 - 15.0 g/dL 8.5  6.9  7.5   Hematocrit 36.0 - 46.0 % 26.8  22.3  24.2   Platelets 150 - 400 K/uL 132  147  137       Latest Ref Rng & Units 04/10/2022    4:54 AM 04/07/2022    4:24 AM 04/03/2022    5:07 AM  CMP  Glucose 70 - 99 mg/dL 143  132  176   BUN 6 - 20 mg/dL  '17  16  17   '$ Creatinine 0.44 - 1.00 mg/dL 0.35  0.40  0.42   Sodium 135 - 145 mmol/L 132  131  131   Potassium 3.5 - 5.1 mmol/L 4.2  4.1  4.2   Chloride 98 - 111 mmol/L 103  103  102   CO2 22 - 32 mmol/L '23  24  24   '$ Calcium 8.9 - 10.3 mg/dL 8.4  7.7  7.9   Total Protein 6.5 - 8.1 g/dL 7.1  5.8  5.9   Total Bilirubin 0.3 - 1.2 mg/dL 0.5  0.5  0.2   Alkaline Phos 38 - 126 U/L 85  78  68   AST 15 - 41 U/L '22  19  19   '$ ALT 0 - 44 U/L '13  12  12      '$ Imaging studies:  No new imaging studies   Assessment/Plan: 56 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  - New 08/03: Nothing new today  - Wound Care: [name] pouch in place with red rubber present to manage drainage. Anticipate fluid will pool in  the wound bed while she is in bed. This has become semi-solid stool. It is okay to adjust the red rubber catheter from time to time to help with drainage while in bed but she remain in dependent portion of [name]. This can be removed from suction to ambulate and work with therapies. These pouches need changed every 7-10 days (ideally). Changed 07/28; next change 08/07 if no issues in interim   - Monitor H&H; transfused 1 unit pRBCs (07/31); responded well  - Monitor right upper extremity; no pain; continue prophylactic anticoagulation. Will hold off on repeat US - Continue CLD in effort to thin up effluent + nutritional supplementation - Continue TPN at goal; monitor electrolytes  - Monitor abdominal examination; on-going bowel function  - Pain control prn; antemetic prn  - Out of bed; discharged from therapies; no recommendations at this time   - Discharge Planning: Anticipate lengthy admission and potential eventual transfer   All of the  above findings and recommendations were discussed with the patient, and the medical team, and all of patient's questions were answered to her expressed satisfaction.  -- [name], PA-C [facility] 04/10/2022, 7:58 AM M-F: 7am - 4pm

## 2022-04-10 NOTE — Progress Notes (Signed)
PHARMACY - TOTAL PARENTERAL NUTRITION CONSULT NOTE   Indication: Prolonged ileus  Patient Measurements: Height: '4\' 11"'$  (149.9 cm) Weight: 94.1 kg (207 lb 7.3 oz) IBW/kg (Calculated) : 43.2 TPN AdjBW (KG): 55.2 Body mass index is 41.9 kg/m.  Assessment:  56 y.o. female s/p laparotomy, excision of greater omental mass, abdominal wall reconstruction with [name],appendectomy, and placement of Prevena vac.  Glucose / Insulin: No apparent history of diabetes. BG goal 140 - 180. BG 83-145 in preceding 24 hours. 8 units of SSI required Electrolytes:  stable hyponatremia, all other electrolytes WNL (no change) Renal: Scr ~ 0.4 Hepatic: No transaminitis. LFTs within normal limits. TG within normal limits Intake/Output: Total I/O: + 1 > 2 L Output: 6.4 L GI Imaging: GI Surgeries / Procedures: s/p laparotomy, excision of greater omental mass, abdominal wall reconstruction with [name], appendectomy, and placement of Prevena vac  Central access: 02/15/22 TPN start date: 02/15/22  Nutritional Goals: Goal TPN rate of 65 mL/hr (provides 106 g of protein and 1872 kcals per day)  RD Assessment:  Estimated Needs Total Energy Estimated Needs: 1800-2100kcal/day Total Protein Estimated Needs: 90-110g/d Total Fluid Estimated Needs: 1.4-1.6L/day  Current Nutrition:  CLD + nutritional supplements (eating ~10% of meals) Thin liquid diet  Plan: Continue TPN at 68m/hr Nutritional Components Amino acids (using 15% Clinisol): 106 grams Dextrose: 265.2 grams Lipids (using 20% SMOFlipids): 54.6 grams kCal: 1872/24h  Electrolytes in TPN (standard): Na 527m/L, K 704mL, Ca 5mE34m, Mg 10mE48m and Phos 25 mmol/L. Cl:Ac 1:1 Electrolytes stable Enteral multivitamin. Add trace elements to TPN Continue moderate SSI TIDM + CBG 4x/day (ACHS) + 25u in TPN Monitor TPN labs on Mon/Thurs  Azalyn Sliwa Rodriguez-Guzman PharmD, BCPS 04/10/2022 10:48 AM

## 2022-04-10 NOTE — Plan of Care (Signed)
  Problem: Clinical Measurements: Goal: Will remain free from infection Outcome: Not Progressing   Problem: Nutrition: Goal: Adequate nutrition will be maintained Outcome: Not Progressing   

## 2022-04-11 LAB — GLUCOSE, CAPILLARY
Glucose-Capillary: 128 mg/dL — ABNORMAL HIGH (ref 70–99)
Glucose-Capillary: 116 mg/dL — ABNORMAL HIGH (ref 70–99)
Glucose-Capillary: 138 mg/dL — ABNORMAL HIGH (ref 70–99)
Glucose-Capillary: 116 mg/dL — ABNORMAL HIGH (ref 70–99)

## 2022-04-11 LAB — CBC
HCT: 30 % — ABNORMAL LOW (ref 36.0–46.0)
Hemoglobin: 9.3 g/dL — ABNORMAL LOW (ref 12.0–15.0)
MCH: 25.5 pg — ABNORMAL LOW (ref 26.0–34.0)
MCHC: 31 g/dL (ref 30.0–36.0)
MCV: 82.2 fL (ref 80.0–100.0)
Platelets: 174 10*3/uL (ref 150–400)
RBC: 3.65 MIL/uL — ABNORMAL LOW (ref 3.87–5.11)
RDW: 16.1 % — ABNORMAL HIGH (ref 11.5–15.5)
WBC: 4.6 10*3/uL (ref 4.0–10.5)
nRBC: 0 % (ref 0.0–0.2)

## 2022-04-11 LAB — HEPARIN ANTI-XA: Heparin LMW: 0.41 IU/mL

## 2022-04-11 MED ORDER — TRACE MINERALS CU-MN-SE-ZN 300-55-60-3000 MCG/ML IV SOLN
INTRAVENOUS | Status: AC
  Filled 2022-04-11: qty 707.2

## 2022-04-11 NOTE — Progress Notes (Signed)
[place] Hospital Day(s): 79.   Post op day(s): 55 Days Post-Op.   Interval History:  Patient seen and examined No issues overnight No abdominal pain; nausea, emesis, fevers BP remains improved; Hemodynamically stable otherwise  No new labs [name] Pouch output unmeasured in last 24 hours; changed 07/28 She is on CLD; on TPN Ambulating independently   Vital signs in last 24 hours: [min-max] current  Temp:  [98 F (36.7 C)-98.7 F (37.1 C)] 98.2 F (36.8 C) (08/04 0353) Pulse Rate:  [72-78] 78 (08/04 0353) Resp:  [17-19] 18 (08/04 0353) BP: (97-122)/(50-60) 122/51 (08/04 0353) SpO2:  [96 %-99 %] 98 % (08/04 0353) Weight:  [94 kg] 94 kg (08/04 0412)     Height: '4\' 11"'$  (149.9 cm) Weight: 94 kg BMI (Calculated): 43.04   Intake/Output last 2 shifts:  08/03 0701 - 08/04 0700 In: 1055.7 [I.V.:1055.7] Out: 700 [Drains:700]   Physical Exam:  Constitutional: alert, cooperative and no distress  Respiratory: breathing non-labored at rest  Cardiovascular: regular rate and sinus rhythm  Gastrointestinal: Soft, abdominal soreness on the right, non-distended, no rebound/guarding. Integumentary: Midline wound open, peritoneum closed with fistulous connection on left lateral aspect and possible in RLQ. Mesh excised. Output is more bilious and thin. Red rubbed in better position. [name] pouch without leak.  MSK: RUE edema improved; non-tender, peripheral pulses 2+  Labs:     Latest Ref Rng & Units 04/08/2022    8:52 AM 04/07/2022    4:24 AM 03/31/2022    5:46 AM  CBC  WBC 4.0 - 10.5 K/uL 4.0  3.8  4.2   Hemoglobin 12.0 - 15.0 g/dL 8.5  6.9  7.5   Hematocrit 36.0 - 46.0 % 26.8  22.3  24.2   Platelets 150 - 400 K/uL 132  147  137       Latest Ref Rng & Units 04/10/2022    4:54 AM 04/07/2022    4:24 AM 04/03/2022    5:07 AM  CMP  Glucose 70 - 99 mg/dL 143  132  176   BUN 6 - 20 mg/dL '17  16  17   '$ Creatinine 0.44 - 1.00 mg/dL 0.35  0.40  0.42    Sodium 135 - 145 mmol/L 132  131  131   Potassium 3.5 - 5.1 mmol/L 4.2  4.1  4.2   Chloride 98 - 111 mmol/L 103  103  102   CO2 22 - 32 mmol/L '23  24  24   '$ Calcium 8.9 - 10.3 mg/dL 8.4  7.7  7.9   Total Protein 6.5 - 8.1 g/dL 7.1  5.8  5.9   Total Bilirubin 0.3 - 1.2 mg/dL 0.5  0.5  0.2   Alkaline Phos 38 - 126 U/L 85  78  68   AST 15 - 41 U/L '22  19  19   '$ ALT 0 - 44 U/L '13  12  12      '$ Imaging studies:  No new imaging studies   Assessment/Plan: 56 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  - New 08/04: Nothing new today  - Wound Care: [name] pouch in place with red rubber present to manage drainage. Anticipate fluid will pool in the wound bed while she is in bed. This has  become semi-solid stool. It is okay to adjust the red rubber catheter from time to time to help with drainage while in bed but she remain in dependent portion of [name]. This can be removed from suction to ambulate and work with therapies. These pouches need changed every 7-10 days (ideally). Changed 07/28; next change 08/07 if no issues in interim   - Monitor H&H; transfused 1 unit pRBCs (07/31); responded well  - Monitor right upper extremity; no pain; continue prophylactic anticoagulation. Will hold off on repeat US - Continue CLD in effort to thin up effluent + nutritional supplementation - Continue TPN at goal; monitor electrolytes  - Monitor abdominal examination; on-going bowel function  - Pain control prn; antemetic prn  - Out of bed; discharged from therapies; no recommendations at this time   - Discharge Planning: Anticipate lengthy admission and potential eventual transfer   All of the above findings and recommendations were discussed with  the patient, and the medical team, and all of patient's questions were answered to her expressed satisfaction.  -- Debra Simon, PA-C [facility] 04/11/2022, 7:15 AM M-F: 7am - 4pm

## 2022-04-11 NOTE — Progress Notes (Signed)
MEDICATION RELATED CONSULT NOTE - FOLLOW UP  Pharmacy Consult for enoxaparin [place] - long term (Started 6/26) Indication: DVT prophylaxis  No Known Allergies  Patient Measurements: Height: '4\' 11"'$  (149.9 cm) Weight: 94 kg (207 lb 3.7 oz) IBW/kg (Calculated) : 43.2  Vital Signs: Temp: 98.3 F (36.8 C) (08/04 0748) Temp Source: Oral (08/04 0748) BP: 107/72 (08/04 0748) Pulse Rate: 79 (08/04 0748)  Labs: Recent Labs    04/10/22 0454 04/11/22 1327  WBC  --  4.6  HGB  --  9.3*  HCT  --  30.0*  PLT  --  174  CREATININE 0.35*  --   MG 1.9  --   PHOS 3.7  --   ALBUMIN 2.8*  --   PROT 7.1  --   AST 22  --   ALT 13  --   ALKPHOS 85  --   BILITOT 0.5  --    Estimated Creatinine Clearance: 76.8 mL/min (A) (by C-G formula based on SCr of 0.35 mg/dL (L)).   Assessment: Patient on enoxaparin for DVT prophylaxis long term. Started > 5 weeks ago. Current dose 0.'5mg'$ /kg and BMI > 40.  Goal of Therapy:  VTE prophylaxis 0.2-0.4 units/ml  Results: LMWH anti Xa = 0.41 on 8/4 @ 1327 (4hrs after dose given - steady state)  Plan:  Will continue current lovenox dose and monitor s/sx daily Plan to repeat anti-Xa level in 4 weeks if patient remains on lovenox therapy.  Iyanna Drummer Rodriguez-Guzman PharmD, BCPS 04/11/2022 3:31 PM

## 2022-04-11 NOTE — Progress Notes (Signed)
Mobility Specialist - Progress Note    04/11/22 1500  Mobility  Activity Ambulated independently in hallway  Level of Makena Other (Comment) (IV Pole)  Distance Ambulated (ft) 400 ft  Activity Response Tolerated well  $Mobility charge 1 Mobility   Merrily Brittle Mobility Specialist 04/11/22, 3:04 PM

## 2022-04-11 NOTE — Progress Notes (Signed)
PHARMACY - TOTAL PARENTERAL NUTRITION CONSULT NOTE   Indication: Prolonged ileus  Patient Measurements: Height: '4\' 11"'$  (149.9 cm) Weight: 94 kg (207 lb 3.7 oz) IBW/kg (Calculated) : 43.2 TPN AdjBW (KG): 55.2 Body mass index is 41.86 kg/m.  Assessment:  56 y.o. female s/p laparotomy, excision of greater omental mass, abdominal wall reconstruction with [name],appendectomy, and placement of Prevena vac.  Glucose / Insulin: No apparent history of diabetes. BG goal 140 - 180. BG 83-145 in preceding 24 hours. 8 units of SSI required Electrolytes:  stable hyponatremia, all other electrolytes WNL (no change) Renal: Scr ~ 0.4 Hepatic: No transaminitis. LFTs within normal limits. TG within normal limits Intake/Output: Total I/O: + 1 > 2 L Output: 6.4 L GI Imaging: GI Surgeries / Procedures: s/p laparotomy, excision of greater omental mass, abdominal wall reconstruction with [name], appendectomy, and placement of Prevena vac  Central access: 02/15/22 TPN start date: 02/15/22  Nutritional Goals: Goal TPN rate of 65 mL/hr (provides 106 g of protein and 1872 kcals per day)  RD Assessment:  Estimated Needs Total Energy Estimated Needs: 1800-2100kcal/day Total Protein Estimated Needs: 90-110g/d Total Fluid Estimated Needs: 1.4-1.6L/day  Current Nutrition:  CLD + nutritional supplements (eating ~10% of meals) Thin liquid diet  Plan: Continue TPN at 8m/hr Nutritional Components Amino acids (using 15% Clinisol): 106 grams Dextrose: 265.2 grams Lipids (using 20% SMOFlipids): 54.6 grams kCal: 1872/24h  Electrolytes in TPN (standard): Na 56m/L, K 7073mL, Ca 5mE53m, Mg 10mE13m and Phos 25 mmol/L. Cl:Ac 1:1 No labs today Enteral multivitamin. Add trace elements to TPN Continue moderate SSI TIDM + CBG 4x/day (ACHS) + 25u in TPN Monitor TPN labs on Mon/Thurs  Emmakate Hypes Rodriguez-Guzman PharmD, BCPS 04/11/2022 11:10 AM

## 2022-04-12 LAB — GLUCOSE, CAPILLARY
Glucose-Capillary: 152 mg/dL — ABNORMAL HIGH (ref 70–99)
Glucose-Capillary: 144 mg/dL — ABNORMAL HIGH (ref 70–99)
Glucose-Capillary: 91 mg/dL (ref 70–99)

## 2022-04-12 MED ORDER — TRACE MINERALS CU-MN-SE-ZN 300-55-60-3000 MCG/ML IV SOLN
INTRAVENOUS | Status: AC
  Filled 2022-04-12: qty 707.2

## 2022-04-12 NOTE — Progress Notes (Signed)
CC: fistula Subjective: Doing ok some nausea 600 cc output   Objective: Vital signs in last 24 hours: Temp:  [98.5 F (36.9 C)-98.7 F (37.1 C)] 98.7 F (37.1 C) (08/05 1002) Pulse Rate:  [73-82] 79 (08/05 1002) Resp:  [16-18] 18 (08/05 1002) BP: (93-107)/(42-64) 107/64 (08/05 1002) SpO2:  [98 %-100 %] 99 % (08/05 1002) Weight:  [92.3 kg] 92.3 kg (08/05 0500) Last BM Date : 04/10/22  Intake/Output from previous day: 08/04 0701 - 08/05 0700 In: 2052.3 [P.O.:480; I.V.:1572.3] Out: 600 [Drains:600] Intake/Output this shift: No intake/output data recorded.  Physical exam:  NAD , alert Open: Midline wound open, peritoneum closed with fistulous connection on left lateral aspect and possible in RLQ. Mesh excised. Output is more bilious and thin. Red rubbed in better position. [name] pouch without leak.    Lab Results: CBC  Recent Labs    04/11/22 1327  WBC 4.6  HGB 9.3*  HCT 30.0*  PLT 174   BMET Recent Labs    04/10/22 0454  NA 132*  K 4.2  CL 103  CO2 23  GLUCOSE 143*  BUN 17  CREATININE 0.35*  CALCIUM 8.4*   PT/INR No results for input(s): "LABPROT", "INR" in the last 72 hours. ABG No results for input(s): "PHART", "HCO3" in the last 72 hours.  Invalid input(s): "PCO2", "PO2"  Studies/Results: No results found.  Anti-infectives: Anti-infectives (From admission, onward)    Start     Dose/Rate Route Frequency Ordered Stop   03/04/22 2200  piperacillin-tazobactam (ZOSYN) IVPB 3.375 g        3.375 g 12.5 mL/hr over 240 Minutes Intravenous Every 8 hours 03/04/22 2005 03/18/22 0248   02/21/22 1500  vancomycin (VANCOREADY) IVPB 1250 mg/250 mL  Status:  Discontinued        1,250 mg 166.7 mL/hr over 90 Minutes Intravenous Every 24 hours 02/21/22 1111 02/24/22 1336   02/21/22 1400  meropenem (MERREM) 1 g in sodium chloride 0.9 % 100 mL IVPB        1 g 200 mL/hr over 30 Minutes Intravenous Every 8 hours 02/21/22 1258 02/27/22 2157   02/20/22 1500   vancomycin (VANCOCIN) IVPB 1000 mg/200 mL premix  Status:  Discontinued        1,000 mg 200 mL/hr over 60 Minutes Intravenous Every 24 hours 02/19/22 1706 02/21/22 1111   02/19/22 1115  vancomycin (VANCOREADY) IVPB 2000 mg/400 mL        2,000 mg 200 mL/hr over 120 Minutes Intravenous  Once 02/19/22 1025 02/19/22 1732   02/19/22 1115  fluconazole (DIFLUCAN) IVPB 400 mg        400 mg 100 mL/hr over 120 Minutes Intravenous Every 24 hours 02/19/22 1025 03/04/22 1301   02/15/22 0730  piperacillin-tazobactam (ZOSYN) IVPB 3.375 g  Status:  Discontinued        3.375 g 12.5 mL/hr over 240 Minutes Intravenous Every 8 hours 02/15/22 0639 02/21/22 1235   02/15/22 0655  piperacillin-tazobactam (ZOSYN) 3.375 GM/50ML IVPB       Note to Pharmacy: Glenford Peers N: cabinet override      02/15/22 0655 02/15/22 0816   02/13/22 2200  cefoTEtan (CEFOTAN) 2 g in sodium chloride 0.9 % 100 mL IVPB  Status:  Discontinued        2 g 200 mL/hr over 30 Minutes Intravenous Every 12 hours 02/13/22 1541 02/15/22 0639   02/13/22 1345  cefoTEtan (CEFOTAN) 2 g in sodium chloride 0.9 % 100 mL IVPB  2 g 200 mL/hr over 30 Minutes Intravenous  Once 02/13/22 1331 02/13/22 2146   02/13/22 1336  sodium chloride 0.9 % with cefoTEtan (CEFOTAN) ADS Med       Note to Pharmacy: Rulon Sera: cabinet override      02/13/22 1336 02/14/22 0144   02/13/22 0620  sodium chloride 0.9 % with cefoTEtan (CEFOTAN) ADS Med       Note to Pharmacy: Norton Blizzard A: cabinet override      02/13/22 0620 02/13/22 0802   02/13/22 0600  cefoTEtan (CEFOTAN) 2 g in sodium chloride 0.9 % 100 mL IVPB        2 g 200 mL/hr over 30 Minutes Intravenous On call to O.R. 02/12/22 2229 02/13/22 2146       Assessment/Plan:  EF fistula Sepsis and wound is controlled Keep TPN, eakin pouch [facility], MD, [facility]  04/12/2022

## 2022-04-12 NOTE — Progress Notes (Signed)
PHARMACY - TOTAL PARENTERAL NUTRITION CONSULT NOTE   Indication: Prolonged ileus  Patient Measurements: Height: '4\' 11"'$  (149.9 cm) Weight: 92.3 kg (203 lb 7.8 oz) IBW/kg (Calculated) : 43.2 TPN AdjBW (KG): 55.2 Body mass index is 41.1 kg/m.  Assessment:  56 y.o. female s/p laparotomy, excision of greater omental mass, abdominal wall reconstruction with [name],appendectomy, and placement of Prevena vac.  Glucose / Insulin: No apparent history of diabetes. BG goal 140 - 180. BG 116-146 in preceding 24 hours. 4 units of SSI moderate required + 25 units insulin in TPN bag Electrolytes:  stable hyponatremia, all other electrolytes WNL (no change) Renal: Scr ~ 0.4 Hepatic: No transaminitis. LFTs within normal limits. TG within normal limits Intake/Output: Total I/O: + 1 > 2 L Output: 6.4 L GI Imaging: GI Surgeries / Procedures: s/p laparotomy, excision of greater omental mass, abdominal wall reconstruction with [name], appendectomy, and placement of Prevena vac  Central access: 02/15/22 TPN start date: 02/15/22  Nutritional Goals: Goal TPN rate of 65 mL/hr (provides 106 g of protein and 1872 kcals per day)  RD Assessment:  Estimated Needs Total Energy Estimated Needs: 1800-2100kcal/day Total Protein Estimated Needs: 90-110g/d Total Fluid Estimated Needs: 1.4-1.6L/day  Current Nutrition:  CLD + nutritional supplements (eating ~10% of meals) Thin liquid diet  Plan: Continue TPN at 72m/hr Nutritional Components Amino acids (using 15% Clinisol): 106 grams Dextrose: 265.2 grams Lipids (using 20% SMOFlipids): 54.6 grams kCal: 1872/24h  Electrolytes in TPN (standard): Na 536m/L, K 7070mL, Ca 5mE46m, Mg 10mE15m and Phos 25 mmol/L. Cl:Ac 1:1 No labs today Enteral multivitamin. Add trace elements to TPN Continue moderate SSI TIDM + CBG 4x/day (ACHS) + 25u insulin in TPN Monitor TPN labs on Mon/Thurs  KristChinita GreenlandmD Clinical  Pharmacist 04/12/2022

## 2022-04-13 LAB — GLUCOSE, CAPILLARY
Glucose-Capillary: 120 mg/dL — ABNORMAL HIGH (ref 70–99)
Glucose-Capillary: 128 mg/dL — ABNORMAL HIGH (ref 70–99)
Glucose-Capillary: 107 mg/dL — ABNORMAL HIGH (ref 70–99)

## 2022-04-13 MED ORDER — TRACE MINERALS CU-MN-SE-ZN 300-55-60-3000 MCG/ML IV SOLN
INTRAVENOUS | Status: AC
  Filled 2022-04-13: qty 707.2

## 2022-04-13 NOTE — Progress Notes (Signed)
PHARMACY - TOTAL PARENTERAL NUTRITION CONSULT NOTE   Indication: Prolonged ileus  Patient Measurements: Height: '4\' 11"'$  (149.9 cm) Weight: 93.3 kg (205 lb 11 oz) IBW/kg (Calculated) : 43.2 TPN AdjBW (KG): 55.2 Body mass index is 41.54 kg/m.  Assessment:  56 y.o. female s/p laparotomy, excision of greater omental mass, abdominal wall reconstruction with [name],appendectomy, and placement of Prevena vac.  Glucose / Insulin: No apparent history of diabetes. BG goal 140 - 180. BG 91 in preceding 24 hours. 5 units of SSI moderate required + 25 units insulin in TPN bag Electrolytes:  stable hyponatremia, all other electrolytes WNL (no change) Renal: Scr ~ 0.4 Hepatic: No transaminitis. LFTs within normal limits. TG within normal limits Intake/Output: Total I/O: + 1 > 2 L Output: 6.4 L GI Imaging: GI Surgeries / Procedures: s/p laparotomy, excision of greater omental mass, abdominal wall reconstruction with [name], appendectomy, and placement of Prevena vac  Central access: 02/15/22 TPN start date: 02/15/22  Nutritional Goals: Goal TPN rate of 65 mL/hr (provides 106 g of protein and 1872 kcals per day)  RD Assessment:  Estimated Needs Total Energy Estimated Needs: 1800-2100kcal/day Total Protein Estimated Needs: 90-110g/d Total Fluid Estimated Needs: 1.4-1.6L/day  Current Nutrition:  CLD + nutritional supplements (eating ~10% of meals) Thin liquid diet  Plan: Continue TPN at 84m/hr Nutritional Components Amino acids (using 15% Clinisol): 106 grams Dextrose: 265.2 grams Lipids (using 20% SMOFlipids): 54.6 grams kCal: 1872/24h  Electrolytes in TPN (standard): Na 517m/L, K 7048mL, Ca 5mE55m, Mg 10mE91m and Phos 25 mmol/L. Cl:Ac 1:1 No labs today Enteral multivitamin. Add trace elements to TPN Continue moderate SSI TIDM + CBG 4x/day (ACHS) + 22u insulin in TPN Monitor TPN labs on Mon/Thurs  KishaEleonore ChiquitormD Clinical Pharmacist 04/13/2022

## 2022-04-13 NOTE — Progress Notes (Signed)
Doing better Ambulating CLD  PE NAD Abd: [name] pouch  A/P EC fistula Continue current care TPN, wound care

## 2022-04-14 LAB — COMPREHENSIVE METABOLIC PANEL
ALT: 15 U/L (ref 0–44)
AST: 26 U/L (ref 15–41)
Albumin: 2.4 g/dL — ABNORMAL LOW (ref 3.5–5.0)
Alkaline Phosphatase: 89 U/L (ref 38–126)
Anion gap: 5 (ref 5–15)
BUN: 16 mg/dL (ref 6–20)
CO2: 25 mmol/L (ref 22–32)
Calcium: 8.1 mg/dL — ABNORMAL LOW (ref 8.9–10.3)
Chloride: 102 mmol/L (ref 98–111)
Creatinine, Ser: 0.4 mg/dL — ABNORMAL LOW (ref 0.44–1.00)
GFR, Estimated: >60 mL/min (ref >60)
Glucose, Bld: 151 mg/dL — ABNORMAL HIGH (ref 70–99)
Potassium: 4.1 mmol/L (ref 3.5–5.1)
Sodium: 132 mmol/L — ABNORMAL LOW (ref 135–145)
Total Bilirubin: 0.5 mg/dL (ref 0.3–1.2)
Total Protein: 6.2 g/dL — ABNORMAL LOW (ref 6.5–8.1)

## 2022-04-14 LAB — GLUCOSE, CAPILLARY
Glucose-Capillary: 104 mg/dL — ABNORMAL HIGH (ref 70–99)
Glucose-Capillary: 118 mg/dL — ABNORMAL HIGH (ref 70–99)
Glucose-Capillary: 137 mg/dL — ABNORMAL HIGH (ref 70–99)
Glucose-Capillary: 137 mg/dL — ABNORMAL HIGH (ref 70–99)
Glucose-Capillary: 98 mg/dL (ref 70–99)

## 2022-04-14 LAB — TRIGLYCERIDES: Triglycerides: 66 mg/dL (ref <150)

## 2022-04-14 LAB — MAGNESIUM: Magnesium: 1.7 mg/dL (ref 1.7–2.4)

## 2022-04-14 LAB — PHOSPHORUS: Phosphorus: 4 mg/dL (ref 2.5–4.6)

## 2022-04-14 MED ORDER — MAGNESIUM SULFATE 2 GM/50ML IV SOLN
2.0000 g | Freq: Once | INTRAVENOUS | Status: AC
Start: 2022-04-14 — End: 2022-04-14
  Administered 2022-04-14: 2 g via INTRAVENOUS
  Filled 2022-04-14: qty 50

## 2022-04-14 MED ORDER — TRACE MINERALS CU-MN-SE-ZN 300-55-60-3000 MCG/ML IV SOLN
INTRAVENOUS | Status: AC
  Filled 2022-04-14: qty 707.2

## 2022-04-14 NOTE — Consult Note (Addendum)
[facility] Nurse wound follow up  Assisted in dressing change today by Surgery PA-C, Susanne Greenhouse and Bedside RN and Lissa Morales, Cyndi Lennert. Daughter in room and provides interpretation for patient. Patient declines premedication, Bedside RN perform CHG bath after pouching system removal.  Wound type:Abdominal wound with two enteric, stomatized fistulae at 1 and 3 o'clock Measurement:15.5cm x 15cm x 6.5cm Wound bed: Beefy red granulation tissue with two stomatized fistulae as noted above Drainage (amount, consistency, odor) Green liquid effluent; pooling of effluent at distal portions of wound Periwound: intact. 75% of periwound tissue is protected with liquid skin barrier and portions of skin barrier ring and portions of [name] pouching material with plastic backing and covering removed. Dressing procedure/placement/frequency: Pouch removed. Skin cleansed. Photodocumentation of wound by Mr. Delena Bali. CHG bath by Ms. Ronnald Ramp. Periwound skin protection applied and new pouching system applied. Drain spout of [name] pouch is oriented to right lateral side; drain spout is cut away prior to [employer] (RR) catheter insertion into pouch. The RR catheter with three (3) additional apertures cut into it is placed along the distal portion of the internal pouching system. This may be repositioned as needed to manage effluent and to keep off of the wound. The RR catheter is secured using a base layer of paper tape and a cover layer of spirally wrapped 1-inch pink (waterproof) tape. An immediate seal is achieved. Dressing is dated and initiated. Patient tolerated procedure well.  Supplies in room:  3 Large [name] pouches, 2, Red Robinson catheters, size 74F.  Next pouch change is planned for Thursday, 8/17. Change for leakage or other reasons as needed.  [place] nursing team will follow, and will remain available to this patient, the nursing, surgical, and medical teams.   Thank you for inviting [name] to participate in this patient's Plan  of Care.  Maudie Flakes, MSN, RN, CNS, [place], Serita Grammes, [employer], [employer] phone:  [phone]

## 2022-04-14 NOTE — Progress Notes (Signed)
PHARMACY - TOTAL PARENTERAL NUTRITION CONSULT NOTE   Indication: Prolonged ileus  Patient Measurements: Height: '4\' 11"'$  (149.9 cm) Weight: 92.6 kg (204 lb 2.3 oz) IBW/kg (Calculated) : 43.2 TPN AdjBW (KG): 55.2 Body mass index is 41.23 kg/m.  Assessment:  56 y.o. female s/p laparotomy, excision of greater omental mass, abdominal wall reconstruction with [name],appendectomy, and placement of Prevena vac.  Glucose / Insulin: No apparent history of diabetes. BG goal 140 - 180. BG 107-151  in preceding 24 hours. 2 units of SSI moderate required + 22 units insulin in TPN bag Electrolytes:  stable hyponatremia, all other electrolytes WNL (no change) Renal: Scr ~ 0.4 Hepatic: No transaminitis. LFTs within normal limits. TG within normal limits Intake/Output: Total I/O: + 1 > 2 L Output: 6.4 L GI Imaging: GI Surgeries / Procedures: s/p laparotomy, excision of greater omental mass, abdominal wall reconstruction with [name], appendectomy, and placement of Prevena vac  Central access: 02/15/22 TPN start date: 02/15/22  Nutritional Goals: Goal TPN rate of 65 mL/hr (provides 106 g of protein and 1872 kcals per day)  RD Assessment:  Estimated Needs Total Energy Estimated Needs: 1800-2100kcal/day Total Protein Estimated Needs: 90-110g/d Total Fluid Estimated Needs: 1.4-1.6L/day  Current Nutrition:  CLD + nutritional supplements (eating ~10% of meals) Thin liquid diet  Plan: Continue TPN at 51m/hr Nutritional Components Amino acids (using 15% Clinisol): 106 grams Dextrose: 265.2 grams Lipids (using 20% SMOFlipids): 54.6 grams kCal: 1872/24h  Electrolytes in TPN (standard): Na 556m/L, K 7088mL, Ca 5mE26m, Mg 10mE81m and Phos 25 mmol/L. Cl:Ac 1:1 Enteral multivitamin. Add trace elements to TPN Continue moderate SSI TIDM + CBG 4x/day (ACHS) + 22u insulin in TPN Monitor TPN labs on Mon/Thurs  WalidPearla DubonnetrmD Clinical Pharmacist 04/14/2022 3:40  PM

## 2022-04-14 NOTE — Progress Notes (Addendum)
[place] Hospital Day(s): [place] op day(s): 58 Days Post-Op.   Interval History:  Patient seen and examined No issues overnight No abdominal pain; nausea, emesis, fevers CMP and electrolytes reassuring; mild hyponatremia to 132 (stable) [name] Pouch output 1300 in last 24 hours; plan for change today She is on CLD; on TPN Ambulating independently   Vital signs in last 24 hours: [min-max] current  Temp:  [98.2 F (36.8 C)-98.5 F (36.9 C)] 98.2 F (36.8 C) (08/07 0555) Pulse Rate:  [70-77] 70 (08/07 0555) Resp:  [18] 18 (08/07 0555) BP: (98-116)/(50-63) 116/50 (08/07 0555) SpO2:  [99 %] 99 % (08/07 0555) Weight:  [92.6 kg] 92.6 kg (08/07 0506)     Height: '4\' 11"'$  (149.9 cm) Weight: 92.6 kg BMI (Calculated): 43.04   Intake/Output last 2 shifts:  08/06 0701 - 08/07 0700 In: 785.2 [I.V.:785.2] Out: 1300 [Drains:1300]   Physical Exam:  Constitutional: alert, cooperative and no distress  Respiratory: breathing non-labored at rest  Cardiovascular: regular rate and sinus rhythm  Gastrointestinal: Soft, abdominal soreness on the right, non-distended, no rebound/guarding. Integumentary: Midline wound open, peritoneum closed; granulating;  there are two stomatized areas visible in the LUQ and LLQ portions of the wound, output remains bilious;  Red rubbed in better position. [name] pouch without leak.  MSK: RUE edema improved; non-tender, peripheral pulses 2+  Midline Wound (04/14/2022):   -- This is certainly making improvements; wound getting smaller; granulating appropriately; still with mesh to the lateral portions; two visible stomatized fistulas appreciated   Labs:     Latest Ref Rng & Units 04/11/2022    1:27 PM 04/08/2022    8:52 AM 04/07/2022    4:24 AM  CBC  WBC 4.0 - 10.5 K/uL 4.6  4.0  3.8   Hemoglobin 12.0 - 15.0 g/dL 9.3  8.5  6.9   Hematocrit 36.0 - 46.0 % 30.0  26.8  22.3   Platelets 150 - 400 K/uL 174  132  147        Latest Ref Rng & Units 04/14/2022    4:55 AM 04/10/2022    4:54 AM 04/07/2022    4:24 AM  CMP  Glucose 70 - 99 mg/dL 151  143  132   BUN 6 - 20 mg/dL '16  17  16   '$ Creatinine 0.44 - 1.00 mg/dL 0.40  0.35  0.40   Sodium 135 - 145 mmol/L 132  132  131   Potassium 3.5 - 5.1 mmol/L 4.1  4.2  4.1   Chloride 98 - 111 mmol/L 102  103  103   CO2 22 - 32 mmol/L '25  23  24   '$ Calcium 8.9 - 10.3 mg/dL 8.1  8.4  7.7   Total Protein 6.5 - 8.1 g/dL 6.2  7.1  5.8   Total Bilirubin 0.3 - 1.2 mg/dL 0.5  0.5  0.5   Alkaline Phos 38 - 126 U/L 89  85  78   AST 15 - 41 U/L '26  22  19   '$ ALT 0 - 44 U/L '15  13  12      '$ Imaging studies:  No new imaging studies   Assessment/Plan: 56 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  - New 08/07: Change  [name] today; no other new issues  - Wound Care: [name] pouch in place with red rubber present to manage drainage. Anticipate fluid will pool in the wound bed while she is in bed. This has become semi-solid stool. It is okay to adjust the red rubber catheter from time to time to help with drainage while in bed but she remain in dependent portion of [name]. This can be removed from suction to ambulate and work with therapies. These pouches need changed every 7-10 days (ideally). Changed 08/07; next change 08/17 if no issues in interim   - Monitor right upper extremity; no pain; continue prophylactic anticoagulation. Will hold off on repeat US - Continue CLD in effort to thin up effluent + nutritional supplementation - Continue TPN at goal; monitor electrolytes  - Monitor abdominal examination; on-going bowel function  - Pain control prn; antemetic prn  - Out of bed; discharged from therapies; no  recommendations at this time   - Discharge Planning: Anticipate lengthy admission and potential eventual transfer   All of the above findings and recommendations were discussed with the patient, and the medical team, and all of patient's questions were answered to her expressed satisfaction.  -- Debra Simon, PA-C [facility] 04/14/2022, 7:39 AM M-F: 7am - 4pm

## 2022-04-15 LAB — GLUCOSE, CAPILLARY
Glucose-Capillary: 135 mg/dL — ABNORMAL HIGH (ref 70–99)
Glucose-Capillary: 129 mg/dL — ABNORMAL HIGH (ref 70–99)
Glucose-Capillary: 123 mg/dL — ABNORMAL HIGH (ref 70–99)
Glucose-Capillary: 127 mg/dL — ABNORMAL HIGH (ref 70–99)

## 2022-04-15 MED ORDER — TRACE MINERALS CU-MN-SE-ZN 300-55-60-3000 MCG/ML IV SOLN
INTRAVENOUS | Status: AC
  Filled 2022-04-15: qty 707.2

## 2022-04-15 NOTE — Progress Notes (Addendum)
End of shift note:  There were not any significant events during this shift. Pt's eaking pouch was 650 ml during this shift. IV Zofran was given for nauseas. VSS. Pt continues on TPN.

## 2022-04-15 NOTE — Progress Notes (Signed)
[facility] Day(s): [place] op day(s): 59 Days Post-Op.   Interval History:  Patient seen and examined No issues overnight No abdominal pain; nausea, emesis, fevers No new labs this morning  [name] Pouch output 600 in last 24 hours She is on CLD; on TPN Ambulating independently   Vital signs in last 24 hours: [min-max] current  Temp:  [98.4 F (36.9 C)-98.6 F (37 C)] 98.6 F (37 C) (08/08 0426) Pulse Rate:  [72-77] 73 (08/08 0426) Resp:  [18] 18 (08/08 0426) BP: (113-138)/(57-67) 113/57 (08/08 0426) SpO2:  [94 %-100 %] 96 % (08/08 0426) Weight:  [94.4 kg] 94.4 kg (08/08 0500)     Height: '4\' 11"'$  (149.9 cm) Weight: 94.4 kg BMI (Calculated): 43.04   Intake/Output last 2 shifts:  08/07 0701 - 08/08 0700 In: 2159.1 [P.O.:1560; I.V.:599.1] Out: 600 [Drains:600]   Physical Exam:  Constitutional: alert, cooperative and no distress  Respiratory: breathing non-labored at rest  Cardiovascular: regular rate and sinus rhythm  Gastrointestinal: Soft, abdominal soreness on the right, non-distended, no rebound/guarding. Integumentary: Midline wound open, peritoneum closed; granulating;  there are two stomatized areas visible in the LUQ and LLQ portions of the wound, output remains bilious;  Red rubbed in better position. [name] pouch without leak.  MSK: RUE edema improved; non-tender, peripheral pulses 2+   Labs:     Latest Ref Rng & Units 04/11/2022    1:27 PM 04/08/2022    8:52 AM 04/07/2022    4:24 AM  CBC  WBC 4.0 - 10.5 K/uL 4.6  4.0  3.8   Hemoglobin 12.0 - 15.0 g/dL 9.3  8.5  6.9   Hematocrit 36.0 - 46.0 % 30.0  26.8  22.3   Platelets 150 - 400 K/uL 174  132  147       Latest Ref Rng & Units 04/14/2022    4:55 AM 04/10/2022    4:54 AM 04/07/2022    4:24 AM  CMP  Glucose 70 - 99 mg/dL 151  143  132   BUN 6 - 20 mg/dL '16  17  16   '$ Creatinine 0.44 - 1.00 mg/dL 0.40  0.35  0.40   Sodium 135 - 145 mmol/L 132  132  131    Potassium 3.5 - 5.1 mmol/L 4.1  4.2  4.1   Chloride 98 - 111 mmol/L 102  103  103   CO2 22 - 32 mmol/L '25  23  24   '$ Calcium 8.9 - 10.3 mg/dL 8.1  8.4  7.7   Total Protein 6.5 - 8.1 g/dL 6.2  7.1  5.8   Total Bilirubin 0.3 - 1.2 mg/dL 0.5  0.5  0.5   Alkaline Phos 38 - 126 U/L 89  85  78   AST 15 - 41 U/L '26  22  19   '$ ALT 0 - 44 U/L '15  13  12      '$ Imaging studies:  No new imaging studies   Assessment/Plan: 56 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  - New 08/08: No new issues  - Wound Care: [name] pouch in place with red rubber present to manage drainage. Anticipate fluid will pool in the wound bed while she is in bed. This has become semi-solid  stool. It is okay to adjust the red rubber catheter from time to time to help with drainage while in bed but she remain in dependent portion of [name]. This can be removed from suction to ambulate and work with therapies. These pouches need changed every 7-10 days (ideally). Changed 08/07; next change 08/17 if no issues in interim....may change in OR next week pending decision to excision remain mesh    - Monitor right upper extremity; no pain; continue prophylactic anticoagulation. Will hold off on repeat US - Continue CLD in effort to thin up effluent + nutritional supplementation - Continue TPN at goal; monitor electrolytes  - Monitor abdominal examination; on-going bowel function  - Pain control prn; antemetic prn  - Out of bed; discharged from therapies; no recommendations at this time   - Discharge Planning: Anticipate lengthy admission and potential eventual transfer   All of the above findings and recommendations were discussed with the patient, and the medical team,  and all of patient's questions were answered to her expressed satisfaction.  -- [name], PA-C [facility] 04/15/2022, 7:53 AM M-F: 7am - 4pm

## 2022-04-15 NOTE — TOC Progression Note (Signed)
Transition of Care [facility]) - Progression Note    Patient Details  Name: Debra [name] MRN: [id number] Date of Birth: [birth year]  Transition of Care [facility]) CM/SW [place], LCSW Phone Number: 04/15/2022, 10:25 AM  Clinical Narrative: TOC continues to follow progress and monitor for disposition needs.     Expected Discharge Plan: [place] Barriers to Discharge: Continued Medical Work up  Expected Discharge Plan and Services Expected Discharge Plan: [place]                                               Social Determinants of Health (SDOH) Interventions    Readmission Risk Interventions     No data to display

## 2022-04-15 NOTE — Progress Notes (Signed)
PHARMACY - TOTAL PARENTERAL NUTRITION CONSULT NOTE   Indication: Prolonged ileus  Patient Measurements: Height: '4\' 11"'$  (149.9 cm) Weight: 94.4 kg (208 lb 1.8 oz) IBW/kg (Calculated) : 43.2 TPN AdjBW (KG): 55.2 Body mass index is 42.03 kg/m.  Assessment:  56 y.o. female s/p laparotomy, excision of greater omental mass, abdominal wall reconstruction with [name],appendectomy, and placement of Prevena vac.  Glucose / Insulin: No apparent history of diabetes. BG goal 140 - 180. BG 98-137  in preceding 24 hours. 4 units of SSI moderate required + 22 units insulin in TPN bag Electrolytes:  stable hyponatremia, all other electrolytes WNL (no change) Renal: Scr ~ 0.4 Hepatic: No transaminitis. LFTs within normal limits. TG within normal limits Intake/Output: Total I/O: + 7.7 L GI Imaging: GI Surgeries / Procedures: s/p laparotomy, excision of greater omental mass, abdominal wall reconstruction with [name], appendectomy, and placement of Prevena vac  Central access: 02/15/22 TPN start date: 02/15/22  Nutritional Goals: Goal TPN rate of 65 mL/hr (provides 106 g of protein and 1872 kcals per day)  RD Assessment:  Estimated Needs Total Energy Estimated Needs: 1800-2100kcal/day Total Protein Estimated Needs: 90-110g/d Total Fluid Estimated Needs: 1.4-1.6L/day  Current Nutrition:  CLD + nutritional supplements (eating ~10% of meals) Thin liquid diet  Plan: Continue TPN at 14m/hr Nutritional Components Amino acids (using 15% Clinisol): 106 grams Dextrose: 265.2 grams Lipids (using 20% SMOFlipids): 54.6 grams kCal: 1872/24h  Electrolytes in TPN (standard): Na 520m/L, K 7071mL, Ca 5mE74m, Mg 10mE46m and Phos 25 mmol/L. Cl:Ac 1:1 Enteral multivitamin. Add trace elements to TPN Continue moderate SSI TIDM + CBG 4x/day (ACHS) + 20u insulin in TPN Monitor TPN labs on Mon/Thurs  WalidPearla DubonnetrmD Clinical Pharmacist 04/15/2022 10:35 AM

## 2022-04-15 NOTE — Progress Notes (Signed)
Nutrition Follow-up  DOCUMENTATION CODES:   Obesity unspecified  INTERVENTION:   Continue TPN per pharmacy- provides 1872kcal/day and 106g/day protein   Boost Breeze po TID, each supplement provides 250 kcal and 9 grams of protein  Daily weights   NUTRITION DIAGNOSIS:   Increased nutrient needs related to wound healing, catabolic illness as evidenced by estimated needs.  GOAL:   Patient will meet greater than or equal to 90% of their needs -met with TPN   MONITOR:   PO intake, Supplement acceptance, Labs, Weight trends, Diet advancement, I & O's, TPN  ASSESSMENT:   56 y/o female with h/o hypothyroidism, COVID 19 (3/21), kidney stones and stage 3 colon cancer (s/p left hemicolectomy 5/21 and chemotherapy) who is admitted for new pelvic mass now s/p laparotomy 6/8 (with excision of pelvic mass from greater omentum, abdominal wall reconstruction with bilateral myocutaneous flaps and mesh, incisional hernia repair, appendectomy repair and VAC placement) complicated by bowel perforation s/p reopening of recent laparotomy 6/10 (with repair of small bowel perforation, excision of mesh, placement of two phasix mesh and VAC placement). Pathology returned as metastatic adenocarcinoma.   Pt tolerating TPN at goal rate. Refeed labs stable. Triglycerides wnl. Hyperglycemia improved. Pt remains on clear liquid diet. Pt with decreased oral intake; pt eating <25% of meals. Pt is being offered [employer]. Per chart, pt appears weight stable since admission. [name] pouch with 651m output. Pt may require prolonged TPN per MD note. Pt may return to surgery to excise the rest of her mesh per MD note.    Medications reviewed and include: lovenox, heparin, insulin, L-glutamine, synthroid, imodium, MVI, protonix, carafate  Labs reviewed: Na 132(L), K 4.1 wnl, creat 0.40(L), P 4.0 wnl, Mg 1.7 wnl Triglycerides- 66- 8/7 Hgb 9.3(L), Hct 30.0(L) Cbgs- 129, 135 x 24 hrs  Diet Order:   Diet Order              Diet clear liquid Room service appropriate? Yes; Fluid consistency: Thin  Diet effective now                  EDUCATION NEEDS:   Not appropriate for education at this time  Skin:  Skin Assessment: Reviewed RN Assessment ( Midline wound: 15cm x 10cm x 6cm )  Last BM:  8/8  Height:   Ht Readings from Last 1 Encounters:  03/01/22 '4\' 11"'  (1.499 m)    Weight:   Wt Readings from Last 1 Encounters:  04/15/22 94.4 kg    Ideal Body Weight:  44.3 kg  BMI:  Body mass index is 42.03 kg/m.  Estimated Nutritional Needs:   Kcal:  1800-2100kcal/day  Protein:  90-110g/d  Fluid:  1.4-1.6L/day  CKoleen DistanceMS, RD, LDN Please refer to [facility] Centerfor RD and/or RD on-call/weekend/after hours pager

## 2022-04-16 LAB — GLUCOSE, CAPILLARY
Glucose-Capillary: 123 mg/dL — ABNORMAL HIGH (ref 70–99)
Glucose-Capillary: 133 mg/dL — ABNORMAL HIGH (ref 70–99)
Glucose-Capillary: 103 mg/dL — ABNORMAL HIGH (ref 70–99)
Glucose-Capillary: 124 mg/dL — ABNORMAL HIGH (ref 70–99)

## 2022-04-16 MED ORDER — TRACE MINERALS CU-MN-SE-ZN 300-55-60-3000 MCG/ML IV SOLN
INTRAVENOUS | Status: AC
  Filled 2022-04-16: qty 707.2

## 2022-04-16 NOTE — Progress Notes (Signed)
PHARMACY - TOTAL PARENTERAL NUTRITION CONSULT NOTE   Indication: Prolonged ileus  Patient Measurements: Height: '4\' 11"'$  (149.9 cm) Weight: 93.3 kg (205 lb 11 oz) IBW/kg (Calculated) : 43.2 TPN AdjBW (KG): 55.2 Body mass index is 41.54 kg/m.  Assessment:  56 y.o. female s/p laparotomy, excision of greater omental mass, abdominal wall reconstruction with [name],appendectomy, and placement of Prevena vac.  Glucose / Insulin: No apparent history of diabetes. BG goal 140 - 180. BG 123-127  in preceding 24 hours. 6 units of SSI moderate required + 20 units insulin in TPN bag Electrolytes:  stable hyponatremia, all other electrolytes WNL (no change) Renal: Scr ~ 0.4 Hepatic: No transaminitis. LFTs within normal limits. TG within normal limits Intake/Output: Total I/O: + 7.7 L GI Imaging: GI Surgeries / Procedures: s/p laparotomy, excision of greater omental mass, abdominal wall reconstruction with [name], appendectomy, and placement of Prevena vac  Central access: 02/15/22 TPN start date: 02/15/22  Nutritional Goals: Goal TPN rate of 65 mL/hr (provides 106 g of protein and 1872 kcals per day)  RD Assessment:  Estimated Needs Total Energy Estimated Needs: 1800-2100kcal/day Total Protein Estimated Needs: 90-110g/d Total Fluid Estimated Needs: 1.4-1.6L/day  Current Nutrition:  CLD + nutritional supplements (eating ~10% of meals) Thin liquid diet  Plan: Continue TPN at 92m/hr Nutritional Components Amino acids (using 15% Clinisol): 106 grams Dextrose: 265.2 grams Lipids (using 20% SMOFlipids): 54.6 grams kCal: 1872/24h  Electrolytes in TPN (standard): Na 535m/L, K 7019mL, Ca 5mE55m, Mg 10mE85m and Phos 25 mmol/L. Cl:Ac 1:1 Enteral multivitamin. Add trace elements to TPN Continue moderate SSI TIDM + CBG 4x/day (ACHS) + 20u insulin in TPN Monitor TPN labs on Mon/Thurs  WalidPearla DubonnetrmD Clinical Pharmacist 04/16/2022 9:04 AM

## 2022-04-16 NOTE — Progress Notes (Signed)
[facility] Day(s): [place] op day(s): 60 Days Post-Op.   Interval History:  Patient seen and examined No issues overnight No abdominal pain; nausea, emesis, fevers No new labs this morning; bi-weekly nutritional labs for tomorrow   [name] Pouch output 650 in last 24 hours; nothing recorded overnight She is on CLD; on TPN Ambulating independently   Vital signs in last 24 hours: [min-max] current  Temp:  [98 F (36.7 C)-98.6 F (37 C)] 98.5 F (36.9 C) (08/09 0512) Pulse Rate:  [71-77] 77 (08/09 0512) Resp:  [18] 18 (08/09 0512) BP: (90-104)/(47-59) 96/47 (08/09 0512) SpO2:  [94 %-98 %] 94 % (08/09 0512) Weight:  [93.3 kg] 93.3 kg (08/09 0427)     Height: '4\' 11"'$  (149.9 cm) Weight: 93.3 kg BMI (Calculated): 43.04   Intake/Output last 2 shifts:  08/08 0701 - 08/09 0700 In: 753.1 [I.V.:753.1] Out: 650 [Drains:650]   Physical Exam:  Constitutional: alert, cooperative and no distress  Respiratory: breathing non-labored at rest  Cardiovascular: regular rate and sinus rhythm  Gastrointestinal: Soft, abdominal soreness on the right, non-distended, no rebound/guarding. Integumentary: Midline wound open, peritoneum closed; granulating;  there are two stomatized areas visible in the LUQ and LLQ portions of the wound, output remains bilious;  Red rubbed in better position. [name] pouch without leak.  MSK: RUE edema improved; non-tender, peripheral pulses 2+   Labs:     Latest Ref Rng & Units 04/11/2022    1:27 PM 04/08/2022    8:52 AM 04/07/2022    4:24 AM  CBC  WBC 4.0 - 10.5 K/uL 4.6  4.0  3.8   Hemoglobin 12.0 - 15.0 g/dL 9.3  8.5  6.9   Hematocrit 36.0 - 46.0 % 30.0  26.8  22.3   Platelets 150 - 400 K/uL 174  132  147       Latest Ref Rng & Units 04/14/2022    4:55 AM 04/10/2022    4:54 AM 04/07/2022    4:24 AM  CMP  Glucose 70 - 99 mg/dL 151  143  132   BUN 6 - 20 mg/dL '16  17  16   '$ Creatinine 0.44 - 1.00 mg/dL 0.40  0.35   0.40   Sodium 135 - 145 mmol/L 132  132  131   Potassium 3.5 - 5.1 mmol/L 4.1  4.2  4.1   Chloride 98 - 111 mmol/L 102  103  103   CO2 22 - 32 mmol/L '25  23  24   '$ Calcium 8.9 - 10.3 mg/dL 8.1  8.4  7.7   Total Protein 6.5 - 8.1 g/dL 6.2  7.1  5.8   Total Bilirubin 0.3 - 1.2 mg/dL 0.5  0.5  0.5   Alkaline Phos 38 - 126 U/L 89  85  78   AST 15 - 41 U/L '26  22  19   '$ ALT 0 - 44 U/L '15  13  12      '$ Imaging studies:  No new imaging studies   Assessment/Plan: 56 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  - New 08/09: No new issues  - Wound Care: [name] pouch in place with red rubber present to manage drainage. Anticipate fluid will pool in the wound bed while  she is in bed. This has become semi-solid stool. It is okay to adjust the red rubber catheter from time to time to help with drainage while in bed but she remain in dependent portion of [name]. This can be removed from suction to ambulate and work with therapies. These pouches need changed every 7-10 days (ideally). Changed 08/07; next change 08/17 if no issues in interim....may change in OR next week pending decision to excision remain mesh    - Monitor right upper extremity; no pain; continue prophylactic anticoagulation. Will hold off on repeat US - Continue CLD in effort to thin up effluent + nutritional supplementation - Continue TPN at goal; monitor electrolytes  - Monitor abdominal examination; on-going bowel function  - Pain control prn; antemetic prn  - Out of bed; discharged from therapies; no recommendations at this time   - Discharge Planning: Anticipate lengthy admission and potential eventual transfer   All of the above findings and recommendations were  discussed with the patient, and the medical team, and all of patient's questions were answered to her expressed satisfaction.  -- Debra Simon, PA-C [facility] 04/16/2022, 7:47 AM M-F: 7am - 4pm

## 2022-04-17 LAB — COMPREHENSIVE METABOLIC PANEL
ALT: 15 U/L (ref 0–44)
AST: 24 U/L (ref 15–41)
Albumin: 2.4 g/dL — ABNORMAL LOW (ref 3.5–5.0)
Alkaline Phosphatase: 92 U/L (ref 38–126)
Anion gap: 4 — ABNORMAL LOW (ref 5–15)
BUN: 18 mg/dL (ref 6–20)
CO2: 24 mmol/L (ref 22–32)
Calcium: 7.9 mg/dL — ABNORMAL LOW (ref 8.9–10.3)
Chloride: 103 mmol/L (ref 98–111)
Creatinine, Ser: 0.43 mg/dL — ABNORMAL LOW (ref 0.44–1.00)
GFR, Estimated: >60 mL/min (ref >60)
Glucose, Bld: 140 mg/dL — ABNORMAL HIGH (ref 70–99)
Potassium: 3.9 mmol/L (ref 3.5–5.1)
Sodium: 131 mmol/L — ABNORMAL LOW (ref 135–145)
Total Bilirubin: 0.5 mg/dL (ref 0.3–1.2)
Total Protein: 6.5 g/dL (ref 6.5–8.1)

## 2022-04-17 LAB — PHOSPHORUS: Phosphorus: 3.7 mg/dL (ref 2.5–4.6)

## 2022-04-17 LAB — MAGNESIUM: Magnesium: 1.7 mg/dL (ref 1.7–2.4)

## 2022-04-17 LAB — TRIGLYCERIDES: Triglycerides: 54 mg/dL (ref <150)

## 2022-04-17 LAB — GLUCOSE, CAPILLARY
Glucose-Capillary: 153 mg/dL — ABNORMAL HIGH (ref 70–99)
Glucose-Capillary: 133 mg/dL — ABNORMAL HIGH (ref 70–99)
Glucose-Capillary: 137 mg/dL — ABNORMAL HIGH (ref 70–99)
Glucose-Capillary: 139 mg/dL — ABNORMAL HIGH (ref 70–99)

## 2022-04-17 MED ORDER — TRACE MINERALS CU-MN-SE-ZN 300-55-60-3000 MCG/ML IV SOLN
INTRAVENOUS | Status: AC
  Filled 2022-04-17: qty 707.2

## 2022-04-17 MED ORDER — MAGNESIUM SULFATE 2 GM/50ML IV SOLN
2.0000 g | Freq: Once | INTRAVENOUS | Status: AC
  Administered 2022-04-17: 2 g via INTRAVENOUS
  Filled 2022-04-17: qty 50

## 2022-04-17 NOTE — Progress Notes (Signed)
PHARMACY - TOTAL PARENTERAL NUTRITION CONSULT NOTE   Indication: Prolonged ileus  Patient Measurements: Height: '4\' 11"'$  (149.9 cm) Weight: 93.7 kg (206 lb 9.1 oz) IBW/kg (Calculated) : 43.2 TPN AdjBW (KG): 55.2 Body mass index is 41.72 kg/m.  Assessment:  56 y.o. female s/p laparotomy, excision of greater omental mass, abdominal wall reconstruction with [name],appendectomy, and placement of Prevena vac.  Glucose / Insulin: No apparent history of diabetes. BG goal 140 - 180. BG 123-127  in preceding 24 hours. 4 units of SSI moderate required + 20 units insulin in TPN bag Electrolytes:  stable hyponatremia, all other electrolytes WNL (no change) Renal: Scr ~ 0.4 Hepatic: No transaminitis. LFTs within normal limits. TG within normal limits Intake/Output: Total I/O: + 5.7 L GI Imaging: GI Surgeries / Procedures: s/p laparotomy, excision of greater omental mass, abdominal wall reconstruction with [name], appendectomy, and placement of Prevena vac  Central access: 02/15/22 TPN start date: 02/15/22  Nutritional Goals: Goal TPN rate of 65 mL/hr (provides 106 g of protein and 1872 kcals per day)  RD Assessment:  Estimated Needs Total Energy Estimated Needs: 1800-2100kcal/day Total Protein Estimated Needs: 90-110g/d Total Fluid Estimated Needs: 1.4-1.6L/day  Current Nutrition:  CLD + nutritional supplements (eating ~10% of meals) Thin liquid diet  Plan: Continue TPN at 7m/hr Nutritional Components Amino acids (using 15% Clinisol): 106 grams Dextrose: 265.2 grams Lipids (using 20% SMOFlipids): 54.6 grams kCal: 1872/24h  Electrolytes in TPN (standard): Na 559m/L, K 7051mL, Ca 5mE52m, Mg 10mE21m and Phos 25 mmol/L. Cl:Ac 1:1 Enteral multivitamin. Add trace elements to TPN Continue moderate SSI TIDM + CBG 4x/day (ACHS) + 20u insulin in TPN Monitor TPN labs on Mon/Thurs  WalidPearla DubonnetrmD Clinical Pharmacist 04/17/2022 9:47 AM

## 2022-04-17 NOTE — Progress Notes (Signed)
[facility] Hospital Day(s): [place] op day(s): 61 Days Post-Op.   Interval History:  Patient seen and examined No issues overnight No abdominal pain; nausea, emesis, fevers Renal function remains normal; sCr - 0.43; UO - unmeasured Stable, mild, hyponatremia to 1313; otherwise no electrolyte derangements  [name] Pouch output 650 in last 24 hours; nothing recorded overnight She is on CLD; on TPN Ambulating independently   Vital signs in last 24 hours: [min-max] current  Temp:  [98 F (36.7 C)-98.6 F (37 C)] 98 F (36.7 C) (08/10 0351) Pulse Rate:  [68-83] 74 (08/10 0351) Resp:  [16-18] 18 (08/10 0351) BP: (94-112)/(39-65) 94/39 (08/10 0351) SpO2:  [97 %-100 %] 98 % (08/10 0351) Weight:  [93.7 kg] 93.7 kg (08/10 0500)     Height: '4\' 11"'$  (149.9 cm) Weight: 93.7 kg BMI (Calculated): 43.04   Intake/Output last 2 shifts:  08/09 0701 - 08/10 0700 In: 360 [P.O.:360] Out: 850 [Drains:350]   Physical Exam:  Constitutional: alert, cooperative and no distress  Respiratory: breathing non-labored at rest  Cardiovascular: regular rate and sinus rhythm  Gastrointestinal: Soft, abdominal soreness on the right, non-distended, no rebound/guarding. Integumentary: Midline wound open, peritoneum closed; granulating;  there are two stomatized areas visible in the LUQ and LLQ portions of the wound, output remains bilious;  Red rubbed in better position. [name] pouch without leak.  MSK: RUE edema improved; non-tender, peripheral pulses 2+   Labs:     Latest Ref Rng & Units 04/11/2022    1:27 PM 04/08/2022    8:52 AM 04/07/2022    4:24 AM  CBC  WBC 4.0 - 10.5 K/uL 4.6  4.0  3.8   Hemoglobin 12.0 - 15.0 g/dL 9.3  8.5  6.9   Hematocrit 36.0 - 46.0 % 30.0  26.8  22.3   Platelets 150 - 400 K/uL 174  132  147       Latest Ref Rng & Units 04/17/2022    5:14 AM 04/14/2022    4:55 AM 04/10/2022    4:54 AM  CMP  Glucose 70 - 99 mg/dL 140  151  143   BUN 6 -  20 mg/dL '18  16  17   '$ Creatinine 0.44 - 1.00 mg/dL 0.43  0.40  0.35   Sodium 135 - 145 mmol/L 131  132  132   Potassium 3.5 - 5.1 mmol/L 3.9  4.1  4.2   Chloride 98 - 111 mmol/L 103  102  103   CO2 22 - 32 mmol/L '24  25  23   '$ Calcium 8.9 - 10.3 mg/dL 7.9  8.1  8.4   Total Protein 6.5 - 8.1 g/dL 6.5  6.2  7.1   Total Bilirubin 0.3 - 1.2 mg/dL 0.5  0.5  0.5   Alkaline Phos 38 - 126 U/L 92  89  85   AST 15 - 41 U/L '24  26  22   '$ ALT 0 - 44 U/L '15  15  13      '$ Imaging studies:  No new imaging studies   Assessment/Plan: 56 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  - New 08/10: No new issues  - Wound Care: [name] pouch in place with red rubber present to manage drainage. Anticipate  fluid will pool in the wound bed while she is in bed. This has become semi-solid stool. It is okay to adjust the red rubber catheter from time to time to help with drainage while in bed but she remain in dependent portion of [name]. This can be removed from suction to ambulate and work with therapies. These pouches need changed every 7-10 days (ideally). Changed 08/07; next change 08/17 if no issues in interim....may change in OR next week pending decision to excision remain mesh    - Monitor right upper extremity; no pain; continue prophylactic anticoagulation. Will hold off on repeat US - Continue CLD in effort to thin up effluent + nutritional supplementation - Continue TPN at goal; monitor electrolytes  - Monitor abdominal examination; on-going bowel function  - Pain control prn; antemetic prn  - Out of bed; discharged from therapies; no recommendations at this time   - Discharge Planning: Anticipate lengthy admission and potential eventual  transfer   All of the above findings and recommendations were discussed with the patient, and the medical team, and all of patient's questions were answered to her expressed satisfaction.  -- [name], PA-C [facility] 04/17/2022, 7:44 AM M-F: 7am - 4pm

## 2022-04-18 LAB — GLUCOSE, CAPILLARY
Glucose-Capillary: 136 mg/dL — ABNORMAL HIGH (ref 70–99)
Glucose-Capillary: 148 mg/dL — ABNORMAL HIGH (ref 70–99)
Glucose-Capillary: 112 mg/dL — ABNORMAL HIGH (ref 70–99)
Glucose-Capillary: 138 mg/dL — ABNORMAL HIGH (ref 70–99)

## 2022-04-18 MED ORDER — ALTEPLASE 2 MG IJ SOLR
2.0000 mg | Freq: Once | INTRAMUSCULAR | Status: DC
  Filled 2022-04-18: qty 2

## 2022-04-18 MED ORDER — TRACE MINERALS CU-MN-SE-ZN 300-55-60-3000 MCG/ML IV SOLN
INTRAVENOUS | Status: AC
  Filled 2022-04-18: qty 707.2

## 2022-04-18 NOTE — Progress Notes (Signed)
PHARMACY - TOTAL PARENTERAL NUTRITION CONSULT NOTE   Indication: Prolonged ileus  Patient Measurements: Height: '4\' 11"'$  (149.9 cm) Weight: 94 kg (207 lb 3.7 oz) IBW/kg (Calculated) : 43.2 TPN AdjBW (KG): 55.2 Body mass index is 41.86 kg/m.  Assessment:  56 y.o. female s/p laparotomy, excision of greater omental mass, abdominal wall reconstruction with [name],appendectomy, and placement of Prevena vac.  Glucose / Insulin: No apparent history of diabetes. BG goal 140 - 180. BG 133 - 148 in preceding 24 hours. 4 units of SSI moderate required + 20 units insulin in TPN bag Electrolytes:  stable hyponatremia, all other electrolytes WNL (no change) Renal: Scr ~ 0.4 Hepatic: No transaminitis. LFTs within normal limits. TG within normal limits Intake/Output: Total I/O: + 5.7 L GI Imaging: GI Surgeries / Procedures: s/p laparotomy, excision of greater omental mass, abdominal wall reconstruction with [name], appendectomy, and placement of Prevena vac  Central access: 02/15/22 TPN start date: 02/15/22  Nutritional Goals: Goal TPN rate of 65 mL/hr (provides 106 g of protein and 1872 kcals per day)  RD Assessment:  Estimated Needs Total Energy Estimated Needs: 1800-2100kcal/day Total Protein Estimated Needs: 90-110g/d Total Fluid Estimated Needs: 1.4-1.6L/day  Current Nutrition:  CLD + nutritional supplements (eating ~10% of meals) Thin liquid diet  Plan: Continue TPN at 75m/hr Nutritional Components Amino acids (using 15% Clinisol): 106 grams Dextrose: 265.2 grams Lipids (using 20% SMOFlipids): 54.6 grams kCal: 1872/24h  Electrolytes in TPN (standard): Na 549m/L, K 7017mL, Ca 5mE16m, Mg 10mE49m and Phos 25 mmol/L. Cl:Ac 1:1 Enteral multivitamin. Add trace elements to TPN Continue moderate SSI TIDM + CBG 4x/day (ACHS) + 20u insulin in TPN Monitor TPN labs on Mon/Thurs  Kahiau Schewe Paulina FusirmD, BCPS 04/18/2022 2:36 PM

## 2022-04-18 NOTE — Progress Notes (Signed)
[facility] RN noted patient's need to have her port needle changed. Contacted unit RN per [employer] and advised of needle change needed. Advised if RN unable to complete and assistance needed, nurse should place IVT consult.

## 2022-04-18 NOTE — Progress Notes (Signed)
CC: EC fistula Subjective: Had some chills but subsided, no fevers [name] pouch changed today by ostomy Rn ambulating  Objective: Vital signs in last 24 hours: Temp:  [98.2 F (36.8 C)-98.6 F (37 C)] 98.6 F (37 C) (08/11 0813) Pulse Rate:  [74-84] 77 (08/11 0813) Resp:  [16-20] 17 (08/11 0813) BP: (94-115)/(55-85) 94/56 (08/11 0813) SpO2:  [95 %-99 %] 97 % (08/11 0813) Weight:  [94 kg] 94 kg (08/11 0500) Last BM Date :  (per pt, no BM per rectal in a while)  Intake/Output from previous day: 08/10 0701 - 08/11 0700 In: -  Out: 750  Intake/Output this shift: Total I/O In: 1148.4 [I.V.:1148.4] Out: -   Physical exam:  NAD  Abd: red rubber was crossing in a longitudinal fashion the open wound , I repositioned and noticed a good indentation within the granulation tissue. I Repositioned the red rubber so its shaft would travel along the side of the open wound to prevent any more fistulas  Lab Results: CBC  No results for input(s): "WBC", "HGB", "HCT", "PLT" in the last 72 hours. BMET Recent Labs    04/17/22 0514  NA 131*  K 3.9  CL 103  CO2 24  GLUCOSE 140*  BUN 18  CREATININE 0.43*  CALCIUM 7.9*   PT/INR No results for input(s): "LABPROT", "INR" in the last 72 hours. ABG No results for input(s): "PHART", "HCO3" in the last 72 hours.  Invalid input(s): "PCO2", "PO2"  Studies/Results: No results found.  Anti-infectives: Anti-infectives (From admission, onward)    Start     Dose/Rate Route Frequency Ordered Stop   03/04/22 2200  piperacillin-tazobactam (ZOSYN) IVPB 3.375 g        3.375 g 12.5 mL/hr over 240 Minutes Intravenous Every 8 hours 03/04/22 2005 03/18/22 0248   02/21/22 1500  vancomycin (VANCOREADY) IVPB 1250 mg/250 mL  Status:  Discontinued        1,250 mg 166.7 mL/hr over 90 Minutes Intravenous Every 24 hours 02/21/22 1111 02/24/22 1336   02/21/22 1400  meropenem (MERREM) 1 g in sodium chloride 0.9 % 100 mL IVPB        1 g 200 mL/hr over 30  Minutes Intravenous Every 8 hours 02/21/22 1258 02/27/22 2157   02/20/22 1500  vancomycin (VANCOCIN) IVPB 1000 mg/200 mL premix  Status:  Discontinued        1,000 mg 200 mL/hr over 60 Minutes Intravenous Every 24 hours 02/19/22 1706 02/21/22 1111   02/19/22 1115  vancomycin (VANCOREADY) IVPB 2000 mg/400 mL        2,000 mg 200 mL/hr over 120 Minutes Intravenous  Once 02/19/22 1025 02/19/22 1732   02/19/22 1115  fluconazole (DIFLUCAN) IVPB 400 mg        400 mg 100 mL/hr over 120 Minutes Intravenous Every 24 hours 02/19/22 1025 03/04/22 1301   02/15/22 0730  piperacillin-tazobactam (ZOSYN) IVPB 3.375 g  Status:  Discontinued        3.375 g 12.5 mL/hr over 240 Minutes Intravenous Every 8 hours 02/15/22 0639 02/21/22 1235   02/15/22 0655  piperacillin-tazobactam (ZOSYN) 3.375 GM/50ML IVPB       Note to Pharmacy: Glenford Peers N: cabinet override      02/15/22 0655 02/15/22 0816   02/13/22 2200  cefoTEtan (CEFOTAN) 2 g in sodium chloride 0.9 % 100 mL IVPB  Status:  Discontinued        2 g 200 mL/hr over 30 Minutes Intravenous Every 12 hours 02/13/22 1541 02/15/22 8185  02/13/22 1345  cefoTEtan (CEFOTAN) 2 g in sodium chloride 0.9 % 100 mL IVPB        2 g 200 mL/hr over 30 Minutes Intravenous  Once 02/13/22 1331 02/13/22 2146   02/13/22 1336  sodium chloride 0.9 % with cefoTEtan (CEFOTAN) ADS Med       Note to Pharmacy: Rulon Sera: cabinet override      02/13/22 1336 02/14/22 0144   02/13/22 0620  sodium chloride 0.9 % with cefoTEtan (CEFOTAN) ADS Med       Note to Pharmacy: Norton Blizzard A: cabinet override      02/13/22 0620 02/13/22 0802   02/13/22 0600  cefoTEtan (CEFOTAN) 2 g in sodium chloride 0.9 % 100 mL IVPB        2 g 200 mL/hr over 30 Minutes Intravenous On call to O.R. 02/12/22 2229 02/13/22 2146       Assessment/Plan:  EC fistula Continue wound care and tpn mobilize  Caroleen Hamman, MD, FACS  04/18/2022

## 2022-04-18 NOTE — Plan of Care (Signed)
  Problem: Clinical Measurements: Goal: Will remain free from infection Outcome: Progressing   Problem: Nutrition: Goal: Adequate nutrition will be maintained Outcome: Progressing   Problem: Activity: Goal: Risk for activity intolerance will decrease Outcome: Progressing   Problem: Nutrition: Goal: Adequate nutrition will be maintained Outcome: Progressing   Problem: Coping: Goal: Level of anxiety will decrease Outcome: Progressing   Problem: Pain Managment: Goal: General experience of comfort will improve Outcome: Progressing   

## 2022-04-18 NOTE — Consult Note (Addendum)
Lowry Bala Nurse wound follow up [name] pouch is leaking at lower edges. Removed previous [name] pouch and applied new large [name] pouch.  Refer to previous consult note for measurements.  Wound to midline abd is beefy red with two stomatized fistulae with large amt green liquid drainage from the fistula site. Some mesh remains to inner wound edges. Pt tolerated with minimal amt discomfort and denied need for pain meds when asked in Spanish. Red rubber suction tube applied to low wall suction and taped in place to avoid contact over the wound bed. Applied stopcock to make it easier to disconnect from suction when patient is ambulating. Extra supplies at the bedside and instructions have been provided for bedside nurses to change PRN if leaking. Next pouch change is planned for Thursday, 8/17 with the surgical team.   Julien Girt MSN, RN, [place], [place], [place]

## 2022-04-18 NOTE — Progress Notes (Signed)
VAST consult received for port needle change as current unit RN does not have competency. Patient's right chest port de-accessed and then re-accessed per protocol as per flowsheet charting. Port without blood return both prior to needle change and after needle change. Due to TPN administration, unable to declot line. Unit RN notified that TPN should be stopped a couple of hours early tomorrow to allow for administration of TPA and declotting of line.

## 2022-04-18 NOTE — Progress Notes (Signed)
[name] pouch drain, connected to suction. Output total = 350 ml. Marked.

## 2022-04-19 LAB — GLUCOSE, CAPILLARY
Glucose-Capillary: 143 mg/dL — ABNORMAL HIGH (ref 70–99)
Glucose-Capillary: 141 mg/dL — ABNORMAL HIGH (ref 70–99)
Glucose-Capillary: 143 mg/dL — ABNORMAL HIGH (ref 70–99)
Glucose-Capillary: 92 mg/dL (ref 70–99)
Glucose-Capillary: 155 mg/dL — ABNORMAL HIGH (ref 70–99)

## 2022-04-19 MED ORDER — TRACE MINERALS CU-MN-SE-ZN 300-55-60-3000 MCG/ML IV SOLN
INTRAVENOUS | Status: AC
  Filled 2022-04-19: qty 707.2

## 2022-04-19 MED ORDER — ENOXAPARIN SODIUM 60 MG/0.6ML IJ SOSY
0.5000 mg/kg | PREFILLED_SYRINGE | INTRAMUSCULAR | Status: DC
  Administered 2022-04-19 – 2022-06-17 (×55): 47.5 mg via SUBCUTANEOUS
  Filled 2022-04-19 (×56): qty 0.6

## 2022-04-19 NOTE — Progress Notes (Signed)
Implanted port assessment: TPN dose stopped early in anticipation of alteplase administration for report of no blood return from port. Brisk blood return noted after flushing port. No need for de-clotting agent at this time. RN made aware. Caps changed.

## 2022-04-19 NOTE — Progress Notes (Signed)
Patient verbalizes feeling nauseous. She reports she vomited x1 Zofran given

## 2022-04-19 NOTE — Progress Notes (Signed)
CC: EC fistula Subjective: Had some chills but subsided, no fevers [name] pouch intact with suction functioning. Ambulating Was nauseated following breakfast, with a small volume of clear emesis.  Objective: Vital signs in last 24 hours: Temp:  [98.2 F (36.8 C)-98.9 F (37.2 C)] 98.4 F (36.9 C) (08/12 0730) Pulse Rate:  [74-79] 74 (08/12 0730) Resp:  [16-20] 16 (08/12 0730) BP: (95-104)/(49-53) 104/50 (08/12 0730) SpO2:  [96 %-97 %] 96 % (08/12 0730) Last BM Date :  (per pt, no BM per rectal in a while)  Intake/Output from previous day: 08/11 0701 - 08/12 0700 In: 1806.5 [I.V.:1806.5] Out: 1150 [Urine:500; Stool:350] Intake/Output this shift: No intake/output data recorded.  Physical exam:  NAD  Abd: red rubber catheters in place with a [name]'s pouch intact.  Lab Results:  Recent Labs    04/17/22 0514  NA 131*  K 3.9  CL 103  CO2 24  GLUCOSE 140*  BUN 18  CREATININE 0.43*  CALCIUM 7.9*    Anti-infectives: Anti-infectives (From admission, onward)    Start     Dose/Rate Route Frequency Ordered Stop   03/04/22 2200  piperacillin-tazobactam (ZOSYN) IVPB 3.375 g        3.375 g 12.5 mL/hr over 240 Minutes Intravenous Every 8 hours 03/04/22 2005 03/18/22 0248   02/21/22 1500  vancomycin (VANCOREADY) IVPB 1250 mg/250 mL  Status:  Discontinued        1,250 mg 166.7 mL/hr over 90 Minutes Intravenous Every 24 hours 02/21/22 1111 02/24/22 1336   02/21/22 1400  meropenem (MERREM) 1 g in sodium chloride 0.9 % 100 mL IVPB        1 g 200 mL/hr over 30 Minutes Intravenous Every 8 hours 02/21/22 1258 02/27/22 2157   02/20/22 1500  vancomycin (VANCOCIN) IVPB 1000 mg/200 mL premix  Status:  Discontinued        1,000 mg 200 mL/hr over 60 Minutes Intravenous Every 24 hours 02/19/22 1706 02/21/22 1111   02/19/22 1115  vancomycin (VANCOREADY) IVPB 2000 mg/400 mL        2,000 mg 200 mL/hr over 120 Minutes Intravenous  Once 02/19/22 1025 02/19/22 1732   02/19/22 1115   fluconazole (DIFLUCAN) IVPB 400 mg        400 mg 100 mL/hr over 120 Minutes Intravenous Every 24 hours 02/19/22 1025 03/04/22 1301   02/15/22 0730  piperacillin-tazobactam (ZOSYN) IVPB 3.375 g  Status:  Discontinued        3.375 g 12.5 mL/hr over 240 Minutes Intravenous Every 8 hours 02/15/22 0639 02/21/22 1235   02/15/22 0655  piperacillin-tazobactam (ZOSYN) 3.375 GM/50ML IVPB       Note to Pharmacy: Glenford Peers N: cabinet override      02/15/22 0655 02/15/22 0816   02/13/22 2200  cefoTEtan (CEFOTAN) 2 g in sodium chloride 0.9 % 100 mL IVPB  Status:  Discontinued        2 g 200 mL/hr over 30 Minutes Intravenous Every 12 hours 02/13/22 1541 02/15/22 0639   02/13/22 1345  cefoTEtan (CEFOTAN) 2 g in sodium chloride 0.9 % 100 mL IVPB        2 g 200 mL/hr over 30 Minutes Intravenous  Once 02/13/22 1331 02/13/22 2146   02/13/22 1336  sodium chloride 0.9 % with cefoTEtan (CEFOTAN) ADS Med       Note to Pharmacy: Rulon Sera: cabinet override      02/13/22 1336 02/14/22 0144   02/13/22 0620  sodium chloride 0.9 % with cefoTEtan (CEFOTAN) ADS  Med       Note to Pharmacy: Norton Blizzard A: cabinet override      02/13/22 0620 02/13/22 0802   02/13/22 0600  cefoTEtan (CEFOTAN) 2 g in sodium chloride 0.9 % 100 mL IVPB        2 g 200 mL/hr over 30 Minutes Intravenous On call to O.R. 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

## 2022-04-19 NOTE — Progress Notes (Signed)
PHARMACY - TOTAL PARENTERAL NUTRITION CONSULT NOTE   Indication: Prolonged ileus  Patient Measurements: Height: '4\' 11"'$  (149.9 cm) Weight: 94 kg (207 lb 3.7 oz) IBW/kg (Calculated) : 43.2 TPN AdjBW (KG): 55.2 Body mass index is 41.86 kg/m.  Assessment:  56 y.o. female s/p laparotomy, excision of greater omental mass, abdominal wall reconstruction with [name],appendectomy, and placement of Prevena vac.  Glucose / Insulin: No apparent history of diabetes. BG goal 140 - 180.  BG 112-148 in preceding 24 hours.  4 units of SSI moderate required + 20 units insulin in TPN bag Electrolytes:  stable hyponatremia, all other electrolytes WNL (no change) Renal: Scr ~ 0.4 Hepatic: No transaminitis. LFTs within normal limits. TG within normal limits Intake/Output: Total I/O: + 5.7 L GI Imaging: GI Surgeries / Procedures: s/p laparotomy, excision of greater omental mass, abdominal wall reconstruction with [name], appendectomy, and placement of Prevena vac  Central access: 02/15/22 TPN start date: 02/15/22  Nutritional Goals: Goal TPN rate of 65 mL/hr (provides 106 g of protein and 1872 kcals per day)  RD Assessment:  Estimated Needs Total Energy Estimated Needs: 1800-2100kcal/day Total Protein Estimated Needs: 90-110g/d Total Fluid Estimated Needs: 1.4-1.6L/day  Current Nutrition:  CLD + nutritional supplements (eating ~10% of meals) Thin liquid diet  Plan: Continue TPN at 42m/hr Nutritional Components Amino acids (using 15% Clinisol): 106 grams Dextrose: 265.2 grams Lipids (using 20% SMOFlipids): 54.6 grams kCal: 1872/24h  Electrolytes in TPN (standard): Na 555m/L, K 7014mL, Ca 5mE53m, Mg 10mE84m and Phos 25 mmol/L. Cl:Ac 1:1 Enteral multivitamin. Add trace elements to TPN Continue moderate SSI TIDM + CBG 4x/day (ACHS) + 20u insulin in TPN Monitor TPN labs on Mon/Thurs  KristChinita GreenlandmD Clinical Pharmacist 04/19/2022

## 2022-04-20 LAB — GLUCOSE, CAPILLARY
Glucose-Capillary: 141 mg/dL — ABNORMAL HIGH (ref 70–99)
Glucose-Capillary: 170 mg/dL — ABNORMAL HIGH (ref 70–99)
Glucose-Capillary: 116 mg/dL — ABNORMAL HIGH (ref 70–99)
Glucose-Capillary: 173 mg/dL — ABNORMAL HIGH (ref 70–99)

## 2022-04-20 MED ORDER — TRACE MINERALS CU-MN-SE-ZN 300-55-60-3000 MCG/ML IV SOLN
INTRAVENOUS | Status: AC
  Filled 2022-04-20: qty 707.2

## 2022-04-20 NOTE — Progress Notes (Signed)
PHARMACY - TOTAL PARENTERAL NUTRITION CONSULT NOTE   Indication: Prolonged ileus  Patient Measurements: Height: '4\' 11"'$  (149.9 cm) Weight: 94 kg (207 lb 3.7 oz) IBW/kg (Calculated) : 43.2 TPN AdjBW (KG): 55.2 Body mass index is 41.86 kg/m.  Assessment:  56 y.o. female s/p laparotomy, excision of greater omental mass, abdominal wall reconstruction with [name],appendectomy, and placement of Prevena vac.  Glucose / Insulin: No apparent history of diabetes. BG goal 140 - 180.  BG 92-155 in preceding 24 hours.  4 units of SSI moderate required + 20 units insulin in TPN bag Electrolytes:  8/10: stable hyponatremia, all other electrolytes WNL (no change) Renal: Scr ~ 0.4 Hepatic: No transaminitis. LFTs within normal limits. TG within normal limits Intake/Output: Total I/O: + 5.7 L GI Imaging: GI Surgeries / Procedures: s/p laparotomy, excision of greater omental mass, abdominal wall reconstruction with [name], appendectomy, and placement of Prevena vac  Central access: 02/15/22 TPN start date: 02/15/22  Nutritional Goals: Goal TPN rate of 65 mL/hr (provides 106 g of protein and 1872 kcals per day)  RD Assessment:  Estimated Needs Total Energy Estimated Needs: 1800-2100kcal/day Total Protein Estimated Needs: 90-110g/d Total Fluid Estimated Needs: 1.4-1.6L/day  Current Nutrition:  CLD + nutritional supplements  Thin liquid diet 8/12- nausea after breakfast  Plan: Continue TPN at 76m/hr Nutritional Components Amino acids (using 15% Clinisol): 106 grams Dextrose: 265.2 grams Lipids (using 20% SMOFlipids): 54.6 grams kCal: 1872/24h  Electrolytes in TPN (standard): Na 562m/L, K 7059mL, Ca 5mE89m, Mg 10mE45m and Phos 25 mmol/L. Cl:Ac 1:1 Enteral multivitamin. Add trace elements to TPN Continue moderate SSI TIDM + CBG 4x/day (ACHS) + 20u insulin in TPN Monitor TPN labs on Mon/Thurs  KristChinita GreenlandmD Clinical Pharmacist 04/20/2022

## 2022-04-20 NOTE — Progress Notes (Signed)
CC: EC fistula Subjective: [name] pouch intact with suction functioning. Ambulating Another small episode of clear emesis, otherwise stable.  Objective: Vital signs in last 24 hours: Temp:  [98.1 F (36.7 C)-98.5 F (36.9 C)] 98.1 F (36.7 C) (08/13 0507) Pulse Rate:  [72-81] 72 (08/13 0507) Resp:  [16-20] 20 (08/13 0507) BP: (90-105)/(50-58) 90/50 (08/13 0507) SpO2:  [96 %-99 %] 96 % (08/13 0507) Last BM Date :  (per pt, no BM per rectal in a while)  Intake/Output from previous day: 08/12 0701 - 08/13 0700 In: 1282 [I.V.:1282] Out: 1850 [Urine:1000; Stool:450] Intake/Output this shift: Total I/O In: 552.8 [I.V.:552.8] Out: 400 [Other:400]  Physical exam:  NAD  Abd: red rubber catheters in place with a [name]'s pouch intact.   Anti-infectives: Anti-infectives (From admission, onward)    Start     Dose/Rate Route Frequency Ordered Stop   03/04/22 2200  piperacillin-tazobactam (ZOSYN) IVPB 3.375 g        3.375 g 12.5 mL/hr over 240 Minutes Intravenous Every 8 hours 03/04/22 2005 03/18/22 0248   02/21/22 1500  vancomycin (VANCOREADY) IVPB 1250 mg/250 mL  Status:  Discontinued        1,250 mg 166.7 mL/hr over 90 Minutes Intravenous Every 24 hours 02/21/22 1111 02/24/22 1336   02/21/22 1400  meropenem (MERREM) 1 g in sodium chloride 0.9 % 100 mL IVPB        1 g 200 mL/hr over 30 Minutes Intravenous Every 8 hours 02/21/22 1258 02/27/22 2157   02/20/22 1500  vancomycin (VANCOCIN) IVPB 1000 mg/200 mL premix  Status:  Discontinued        1,000 mg 200 mL/hr over 60 Minutes Intravenous Every 24 hours 02/19/22 1706 02/21/22 1111   02/19/22 1115  vancomycin (VANCOREADY) IVPB 2000 mg/400 mL        2,000 mg 200 mL/hr over 120 Minutes Intravenous  Once 02/19/22 1025 02/19/22 1732   02/19/22 1115  fluconazole (DIFLUCAN) IVPB 400 mg        400 mg 100 mL/hr over 120 Minutes Intravenous Every 24 hours 02/19/22 1025 03/04/22 1301   02/15/22 0730  piperacillin-tazobactam (ZOSYN) IVPB  3.375 g  Status:  Discontinued        3.375 g 12.5 mL/hr over 240 Minutes Intravenous Every 8 hours 02/15/22 0639 02/21/22 1235   02/15/22 0655  piperacillin-tazobactam (ZOSYN) 3.375 GM/50ML IVPB       Note to Pharmacy: Glenford Peers N: cabinet override      02/15/22 0655 02/15/22 0816   02/13/22 2200  cefoTEtan (CEFOTAN) 2 g in sodium chloride 0.9 % 100 mL IVPB  Status:  Discontinued        2 g 200 mL/hr over 30 Minutes Intravenous Every 12 hours 02/13/22 1541 02/15/22 0639   02/13/22 1345  cefoTEtan (CEFOTAN) 2 g in sodium chloride 0.9 % 100 mL IVPB        2 g 200 mL/hr over 30 Minutes Intravenous  Once 02/13/22 1331 02/13/22 2146   02/13/22 1336  sodium chloride 0.9 % with cefoTEtan (CEFOTAN) ADS Med       Note to Pharmacy: Rulon Sera: cabinet override      02/13/22 1336 02/14/22 0144   02/13/22 0620  sodium chloride 0.9 % with cefoTEtan (CEFOTAN) ADS Med       Note to Pharmacy: Norton Blizzard A: cabinet override      02/13/22 0620 02/13/22 0802   02/13/22 0600  cefoTEtan (CEFOTAN) 2 g in sodium chloride 0.9 % 100 mL IVPB  2 g 200 mL/hr over 30 Minutes Intravenous On call to O.R. 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

## 2022-04-21 LAB — COMPREHENSIVE METABOLIC PANEL
ALT: 16 U/L (ref 0–44)
AST: 26 U/L (ref 15–41)
Albumin: 2.7 g/dL — ABNORMAL LOW (ref 3.5–5.0)
Alkaline Phosphatase: 97 U/L (ref 38–126)
Anion gap: 5 (ref 5–15)
BUN: 18 mg/dL (ref 6–20)
CO2: 25 mmol/L (ref 22–32)
Calcium: 8.3 mg/dL — ABNORMAL LOW (ref 8.9–10.3)
Chloride: 101 mmol/L (ref 98–111)
Creatinine, Ser: 0.43 mg/dL — ABNORMAL LOW (ref 0.44–1.00)
GFR, Estimated: >60 mL/min (ref >60)
Glucose, Bld: 122 mg/dL — ABNORMAL HIGH (ref 70–99)
Potassium: 4.4 mmol/L (ref 3.5–5.1)
Sodium: 131 mmol/L — ABNORMAL LOW (ref 135–145)
Total Bilirubin: 0.5 mg/dL (ref 0.3–1.2)
Total Protein: 6.9 g/dL (ref 6.5–8.1)

## 2022-04-21 LAB — GLUCOSE, CAPILLARY
Glucose-Capillary: 148 mg/dL — ABNORMAL HIGH (ref 70–99)
Glucose-Capillary: 141 mg/dL — ABNORMAL HIGH (ref 70–99)
Glucose-Capillary: 151 mg/dL — ABNORMAL HIGH (ref 70–99)
Glucose-Capillary: 121 mg/dL — ABNORMAL HIGH (ref 70–99)

## 2022-04-21 LAB — TRIGLYCERIDES: Triglycerides: 53 mg/dL (ref <150)

## 2022-04-21 LAB — PHOSPHORUS: Phosphorus: 4.2 mg/dL (ref 2.5–4.6)

## 2022-04-21 LAB — MAGNESIUM: Magnesium: 1.8 mg/dL (ref 1.7–2.4)

## 2022-04-21 MED ORDER — TRACE MINERALS CU-MN-SE-ZN 300-55-60-3000 MCG/ML IV SOLN
INTRAVENOUS | Status: AC
  Filled 2022-04-21: qty 707.2

## 2022-04-21 NOTE — Progress Notes (Signed)
At bedside to assess port prior to RN hanging TNA.  RN not available to come to bedside and will send me a chat when available w/in the hour.

## 2022-04-21 NOTE — Progress Notes (Signed)
IVT consulted to assess port for NBR; changed both caps and good, pulsatile flush with GBR.

## 2022-04-21 NOTE — Progress Notes (Signed)
[facility] Day(s): [place] op day(s): 65 Days Post-Op.   Interval History:  Patient seen and examined No issues overnight No abdominal pain; nausea, emesis, fevers Nutritional labs pending this AM [name] Pouch output 400 in last 24 hours; nothing recorded overnight She is on CLD; on TPN Ambulating independently   Vital signs in last 24 hours: [min-max] current  Temp:  [98.1 F (36.7 C)-99.7 F (37.6 C)] 98.8 F (37.1 C) (08/14 0434) Pulse Rate:  [72-86] 74 (08/14 0434) Resp:  [18-20] 20 (08/14 0434) BP: (93-108)/(51-60) 93/51 (08/14 0434) SpO2:  [96 %-99 %] 96 % (08/14 0434)     Height: '4\' 11"'$  (149.9 cm) Weight: 94 kg BMI (Calculated): 43.04   Intake/Output last 2 shifts:  08/13 0701 - 08/14 0700 In: 1379.7 [I.V.:1379.7] Out: 780 [Stool:400]   Physical Exam:  Constitutional: alert, cooperative and no distress  Respiratory: breathing non-labored at rest  Cardiovascular: regular rate and sinus rhythm  Gastrointestinal: Soft, abdominal soreness on the right, non-distended, no rebound/guarding. Integumentary: Midline wound open, peritoneum closed; granulating;  there are two stomatized areas visible in the LUQ and LLQ portions of the wound, output remains bilious;  Red rubbed in better position. [name] pouch without leak.  MSK: RUE edema improved; non-tender, peripheral pulses 2+   Labs:     Latest Ref Rng & Units 04/11/2022    1:27 PM 04/08/2022    8:52 AM 04/07/2022    4:24 AM  CBC  WBC 4.0 - 10.5 K/uL 4.6  4.0  3.8   Hemoglobin 12.0 - 15.0 g/dL 9.3  8.5  6.9   Hematocrit 36.0 - 46.0 % 30.0  26.8  22.3   Platelets 150 - 400 K/uL 174  132  147       Latest Ref Rng & Units 04/17/2022    5:14 AM 04/14/2022    4:55 AM 04/10/2022    4:54 AM  CMP  Glucose 70 - 99 mg/dL 140  151  143   BUN 6 - 20 mg/dL '18  16  17   '$ Creatinine 0.44 - 1.00 mg/dL 0.43  0.40  0.35   Sodium 135 - 145 mmol/L 131  132  132   Potassium 3.5 - 5.1  mmol/L 3.9  4.1  4.2   Chloride 98 - 111 mmol/L 103  102  103   CO2 22 - 32 mmol/L '24  25  23   '$ Calcium 8.9 - 10.3 mg/dL 7.9  8.1  8.4   Total Protein 6.5 - 8.1 g/dL 6.5  6.2  7.1   Total Bilirubin 0.3 - 1.2 mg/dL 0.5  0.5  0.5   Alkaline Phos 38 - 126 U/L 92  89  85   AST 15 - 41 U/L '24  26  22   '$ ALT 0 - 44 U/L '15  15  13      '$ Imaging studies:  No new imaging studies   Assessment/Plan: 56 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  - New 08/14: No new issues  - Wound Care: [name] pouch in place with red rubber present to manage drainage. Anticipate fluid will pool in the wound bed while she is in bed. This has become semi-solid stool. It is okay to adjust the  red rubber catheter from time to time to help with drainage while in bed but she remain in dependent portion of [name]. This can be removed from suction to ambulate and work with therapies. These pouches need changed every 7-10 days (ideally). Changed 08/11; next change 08/21 if no issues in interim....may change in OR this week pending decision to excise remain mesh    - Monitor right upper extremity; no pain; continue prophylactic anticoagulation. Will hold off on repeat US - Continue CLD in effort to thin up effluent + nutritional supplementation - Continue TPN at goal; monitor electrolytes  - Monitor abdominal examination; on-going bowel function  - Pain control prn; antemetic prn  - Out of bed; discharged from therapies; no recommendations at this time   - Discharge Planning: Anticipate lengthy admission and potential eventual transfer   All of the above findings and recommendations were discussed with the patient, and the medical team, and all of patient's  questions were answered to her expressed satisfaction.  -- [name], PA-C [facility] 04/21/2022, 7:28 AM M-F: 7am - 4pm

## 2022-04-21 NOTE — TOC Progression Note (Signed)
Transition of Care [facility]) - Progression Note    Patient Details  Name: Debra [name] MRN: [id number] Date of Birth: Aug 18, 1966  [facility] of [facility]) CM/SW Contact  Beverly Sessions, RN Phone Number: 04/21/2022, 3:04 PM  Clinical Narrative:      TOC continues to follow progress and monitor for disposition needs.     Expected Discharge Plan: [place] Barriers to Discharge: Continued Medical Work up  Expected Discharge Plan and Services Expected Discharge Plan: [place]                                               Social Determinants of Health (SDOH) Interventions    Readmission Risk Interventions     No data to display

## 2022-04-21 NOTE — Plan of Care (Signed)
  Problem: Clinical Measurements: Goal: Will remain free from infection Outcome: Progressing   Problem: Nutrition: Goal: Adequate nutrition will be maintained Outcome: Progressing   Problem: Activity: Goal: Risk for activity intolerance will decrease Outcome: Progressing   Problem: Nutrition: Goal: Adequate nutrition will be maintained Outcome: Progressing   Problem: Coping: Goal: Level of anxiety will decrease Outcome: Progressing   Problem: Pain Managment: Goal: General experience of comfort will improve Outcome: Progressing   

## 2022-04-21 NOTE — Progress Notes (Signed)
IVT consulted to assess port.  RN states no blood return.  IVT went to bedside and assessed site and dressing (see flowsheet).  Advised RN, Debra [name] TNA infusion should be continuous and labs should not be obtained from port during the nutrition infusion.  Advised to place consult prior to hanging new bag and IVT will be glad to assess and troubleshoot, if necessary.

## 2022-04-21 NOTE — Progress Notes (Signed)
PHARMACY - TOTAL PARENTERAL NUTRITION CONSULT NOTE   Indication: Prolonged ileus  Patient Measurements: Height: '4\' 11"'$  (149.9 cm) Weight: 94 kg (207 lb 3.7 oz) IBW/kg (Calculated) : 43.2 TPN AdjBW (KG): 55.2 Body mass index is 41.86 kg/m.  Assessment:  56 y.o. female s/p laparotomy, excision of greater omental mass, abdominal wall reconstruction with [name],appendectomy, and placement of Prevena vac.  Glucose / Insulin: No apparent history of diabetes. BG 116 - 173. preceding 24 hours.  5 units of SSI moderate required + 20 units insulin in TPN bag Electrolytes:  8/10: stable hyponatremia, all other electrolytes WNL (no change) Renal: Scr ~ 0.4 Hepatic: No transaminitis. LFTs within normal limits. TG within normal limits Intake/Output: Total I/O: + 5.7 L GI Imaging: GI Surgeries / Procedures: s/p laparotomy, excision of greater omental mass, abdominal wall reconstruction with [name], appendectomy, and placement of Prevena vac  Central access: 02/15/22 TPN start date: 02/15/22  Nutritional Goals: Goal TPN rate of 65 mL/hr (provides 106 g of protein and 1872 kcals per day)  RD Assessment:  Estimated Needs Total Energy Estimated Needs: 1800-2100kcal/day Total Protein Estimated Needs: 90-110g/d Total Fluid Estimated Needs: 1.4-1.6L/day  Current Nutrition:  CLD + nutritional supplements  Thin liquid diet 8/12- nausea after breakfast  Plan: Continue TPN at 15m/hr Nutritional Components Amino acids (using 15% Clinisol): 106 grams Dextrose: 265.2 grams Lipids (using 20% SMOFlipids): 54.6 grams kCal: 1872/24h  Electrolytes in TPN (standard): Na 571m/L, K 7065mL, Ca 5mE30m, Mg 10mE88m and Phos 25 mmol/L. Cl:Ac 1:1 Enteral multivitamin. Add trace elements to TPN Continue moderate SSI TIDM + CBG 4x/day (ACHS) + 20u insulin in TPN Monitor TPN labs on Mon/Thurs  RodneVallery SarmD, BCPS Clinical Pharmacist 04/21/2022

## 2022-04-22 LAB — GLUCOSE, CAPILLARY
Glucose-Capillary: 131 mg/dL — ABNORMAL HIGH (ref 70–99)
Glucose-Capillary: 154 mg/dL — ABNORMAL HIGH (ref 70–99)
Glucose-Capillary: 123 mg/dL — ABNORMAL HIGH (ref 70–99)
Glucose-Capillary: 119 mg/dL — ABNORMAL HIGH (ref 70–99)

## 2022-04-22 LAB — RENAL FUNCTION PANEL
Albumin: 2.6 g/dL — ABNORMAL LOW (ref 3.5–5.0)
Anion gap: 6 (ref 5–15)
BUN: 20 mg/dL (ref 6–20)
CO2: 24 mmol/L (ref 22–32)
Calcium: 8.2 mg/dL — ABNORMAL LOW (ref 8.9–10.3)
Chloride: 102 mmol/L (ref 98–111)
Creatinine, Ser: 0.41 mg/dL — ABNORMAL LOW (ref 0.44–1.00)
GFR, Estimated: >60 mL/min (ref >60)
Glucose, Bld: 149 mg/dL — ABNORMAL HIGH (ref 70–99)
Phosphorus: 3.9 mg/dL (ref 2.5–4.6)
Potassium: 4 mmol/L (ref 3.5–5.1)
Sodium: 132 mmol/L — ABNORMAL LOW (ref 135–145)

## 2022-04-22 MED ORDER — ENSURE ENLIVE PO LIQD
237.0000 mL | Freq: Three times a day (TID) | ORAL | Status: DC
  Administered 2022-04-22 – 2022-09-29 (×255): 237 mL via ORAL

## 2022-04-22 MED ORDER — TRACE MINERALS CU-MN-SE-ZN 300-55-60-3000 MCG/ML IV SOLN
INTRAVENOUS | Status: AC
  Filled 2022-04-22: qty 707.2

## 2022-04-22 MED ORDER — MAGNESIUM SULFATE 2 GM/50ML IV SOLN
2.0000 g | Freq: Once | INTRAVENOUS | Status: AC
  Administered 2022-04-22: 2 g via INTRAVENOUS
  Filled 2022-04-22: qty 50

## 2022-04-22 NOTE — Progress Notes (Signed)
PHARMACY - TOTAL PARENTERAL NUTRITION CONSULT NOTE   Indication: Prolonged ileus  Patient Measurements: Height: '4\' 11"'$  (149.9 cm) Weight: 93.8 kg (206 lb 12.7 oz) IBW/kg (Calculated) : 43.2 TPN AdjBW (KG): 55.2 Body mass index is 41.77 kg/m.  Assessment:  56 y.o. female s/p laparotomy, excision of greater omental mass, abdominal wall reconstruction with [name],appendectomy, and placement of Prevena vac.  Glucose / Insulin: No apparent history of diabetes. BG 116 - 173. preceding 24 hours.  7 units of SSI moderate required + 20 units insulin in TPN bag Electrolytes:  8/10: stable hyponatremia, all other electrolytes WNL (no change) Renal: Scr ~ 0.4 Hepatic: No transaminitis. LFTs within normal limits. TG within normal limits Intake/Output: Total I/O: + 4.2 L GI Imaging: GI Surgeries / Procedures: s/p laparotomy, excision of greater omental mass, abdominal wall reconstruction with [name], appendectomy, and placement of Prevena vac  Central access: 02/15/22 TPN start date: 02/15/22  Nutritional Goals: Goal TPN rate of 65 mL/hr (provides 106 g of protein and 1872 kcals per day)  RD Assessment:  Estimated Needs Total Energy Estimated Needs: 1800-2100kcal/day Total Protein Estimated Needs: 90-110g/d Total Fluid Estimated Needs: 1.4-1.6L/day  Current Nutrition:  CLD + nutritional supplements  Thin liquid diet 8/12- nausea after breakfast  Plan: Continue TPN at 77m/hr Nutritional Components Amino acids (using 15% Clinisol): 106 grams Dextrose: 265.2 grams Lipids (using 20% SMOFlipids): 54.6 grams kCal: 1872/24h  Electrolytes in TPN (standard): Na 535m/L, K 7059mL, Ca 5mE75m, Mg 10mE58m and Phos 25 mmol/L. Cl:Ac 1:1 Enteral multivitamin. Add trace elements to TPN Continue moderate SSI TIDM + CBG 4x/day (ACHS) + 20u insulin in TPN Monitor TPN labs on Mon/Thurs  WalidPearla DubonnetrmD Clinical Pharmacist 04/22/2022 10:16 AM

## 2022-04-22 NOTE — Progress Notes (Signed)
[facility] Day(s): [place] op day(s): 66 Days Post-Op.   Interval History:  Patient seen and examined No issues overnight No abdominal pain; nausea, emesis, fevers Stable, very mild, hyponatremia to 132 Renal function normal; sCr - 0.41; UO -  unmeasured [name] Pouch output unmeasured in last 24 hours; nothing recorded overnight She is on CLD; on TPN --> Daughter mention patient really wants grits  Ambulating independently   Vital signs in last 24 hours: [min-max] current  Temp:  [97.8 F (36.6 C)-98.5 F (36.9 C)] 98.5 F (36.9 C) (08/15 0259) Pulse Rate:  [73-81] 81 (08/15 0259) Resp:  [16-20] 20 (08/15 0259) BP: (90-111)/(36-74) 111/53 (08/15 0259) SpO2:  [97 %-98 %] 98 % (08/15 0259) Weight:  [93.8 kg] 93.8 kg (08/15 0303)     Height: '4\' 11"'$  (149.9 cm) Weight: 93.8 kg BMI (Calculated): 43.04   Intake/Output last 2 shifts:  08/14 0701 - 08/15 0700 In: 926.6 [P.O.:290; I.V.:636.6] Out: -    Physical Exam:  Constitutional: alert, cooperative and no distress  Respiratory: breathing non-labored at rest  Cardiovascular: regular rate and sinus rhythm  Gastrointestinal: Soft, abdominal soreness on the right, non-distended, no rebound/guarding. Integumentary: Midline wound open, peritoneum closed; granulating;  there are two stomatized areas visible in the LUQ and LLQ portions of the wound, output remains bilious;  Red rubbed in better position. [name] pouch without leak.  MSK: RUE edema improved; non-tender, peripheral pulses 2+   Labs:     Latest Ref Rng & Units 04/11/2022    1:27 PM 04/08/2022    8:52 AM 04/07/2022    4:24 AM  CBC  WBC 4.0 - 10.5 K/uL 4.6  4.0  3.8   Hemoglobin 12.0 - 15.0 g/dL 9.3  8.5  6.9   Hematocrit 36.0 - 46.0 % 30.0  26.8  22.3   Platelets 150 - 400 K/uL 174  132  147       Latest Ref Rng & Units 04/22/2022    4:57 AM 04/21/2022    9:50 PM 04/17/2022    5:14 AM  CMP  Glucose 70 - 99 mg/dL 149   122  140   BUN 6 - 20 mg/dL '20  18  18   '$ Creatinine 0.44 - 1.00 mg/dL 0.41  0.43  0.43   Sodium 135 - 145 mmol/L 132  131  131   Potassium 3.5 - 5.1 mmol/L 4.0  4.4  3.9   Chloride 98 - 111 mmol/L 102  101  103   CO2 22 - 32 mmol/L '24  25  24   '$ Calcium 8.9 - 10.3 mg/dL 8.2  8.3  7.9   Total Protein 6.5 - 8.1 g/dL  6.9  6.5   Total Bilirubin 0.3 - 1.2 mg/dL  0.5  0.5   Alkaline Phos 38 - 126 U/L  97  92   AST 15 - 41 U/L  26  24   ALT 0 - 44 U/L  16  15      Imaging studies:  No new imaging studies   Assessment/Plan: 56 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  - New 08/15: No new issues; will trial FLD  - Wound Care: [name] pouch in place with red rubber present to  manage drainage. Anticipate fluid will pool in the wound bed while she is in bed. This has become semi-solid stool. It is okay to adjust the red rubber catheter from time to time to help with drainage while in bed but she remain in dependent portion of [name]. This can be removed from suction to ambulate and work with therapies. These pouches need changed every 7-10 days (ideally). Changed 08/11; next change 08/21 if no issues in interim. Surgical team will likely preform this and further excise mesh   - Trial FLD at patient request + nutritional supplementation - Continue TPN at goal; monitor electrolytes  - Monitor abdominal examination; on-going bowel function  - Pain control prn; antemetic prn  - Out of bed; discharged from therapies; no recommendations at this time   - Discharge Planning: Anticipate lengthy admission and potential eventual transfer   All of the above findings and recommendations were discussed with the patient, and the medical team,  and all of patient's questions were answered to her expressed satisfaction.  -- [name], PA-C [facility] 04/22/2022, 7:33 AM M-F: 7am - 4pm

## 2022-04-22 NOTE — Progress Notes (Signed)
Nutrition Follow-up  DOCUMENTATION CODES:   Obesity unspecified  INTERVENTION:   Continue TPN per pharmacy- provides 1872kcal/day and 106g/day protein   Ensure Enlive po TID, each supplement provides 350 kcal and 20 grams of protein.  Daily weights   NUTRITION DIAGNOSIS:   Increased nutrient needs related to wound healing, catabolic illness as evidenced by estimated needs.  GOAL:   Patient will meet greater than or equal to 90% of their needs -met with TPN   MONITOR:   PO intake, Supplement acceptance, Labs, Weight trends, Diet advancement, I & O's, TPN  ASSESSMENT:   56 y/o female with h/o hypothyroidism, COVID 19 (3/21), kidney stones and stage 3 colon cancer (s/p left hemicolectomy 5/21 and chemotherapy) who is admitted for new pelvic mass now s/p laparotomy 6/8 (with excision of pelvic mass from greater omentum, abdominal wall reconstruction with bilateral myocutaneous flaps and mesh, incisional hernia repair, appendectomy repair and VAC placement) complicated by bowel perforation s/p reopening of recent laparotomy 6/10 (with repair of small bowel perforation, excision of mesh, placement of two phasix mesh and VAC placement). Pathology returned as metastatic adenocarcinoma.   Met with pt in room today. Pt tolerating TPN at goal rate. Refeed labs stable. Triglycerides wnl. Pt advanced to full liquids today per her request. Pt with improved oral intake; pt ate 100% of breakfast this morning. RD will add vanilla Ensure per pt request. Per chart, pt appears weight stable since admission. [name] pouch last changed 8/11. Pt may require prolonged TPN per MD note. Pt may return to surgery to excise the rest of her mesh per MD note.    Medications reviewed and include: lovenox, heparin, insulin, L-glutamine, synthroid, imodium, MVI, protonix, carafate  Labs reviewed: Na 132(L), K 4.0 wnl, creat 0.41(L), P 3.9 wnl Mg 1.8 wnl- 8/14 Triglycerides- 53- 8/14 Cbgs- 154, 131 x 24  hrs  Nutrition Focused Physical Exam:  Flowsheet Row Most Recent Value  Orbital Region No depletion  Upper Arm Region No depletion  Thoracic and Lumbar Region No depletion  Buccal Region No depletion  Temple Region No depletion  Clavicle Bone Region No depletion  Clavicle and Acromion Bone Region No depletion  Scapular Bone Region No depletion  Dorsal Hand No depletion  Patellar Region No depletion  Anterior Thigh Region No depletion  Posterior Calf Region No depletion  Edema (RD Assessment) Mild  Hair Reviewed  Eyes Reviewed  Mouth Reviewed  Skin Reviewed  Nails Reviewed   Diet Order:   Diet Order             Diet full liquid Room service appropriate? Yes; Fluid consistency: Thin  Diet effective now                  EDUCATION NEEDS:   Not appropriate for education at this time  Skin:  Skin Assessment: Reviewed RN Assessment ( Midline wound: 15cm x 10cm x 6cm )  Last BM:  8/8  Height:   Ht Readings from Last 1 Encounters:  03/01/22 _0  (1.499 m)    Weight:   Wt Readings from Last 1 Encounters:  04/22/22 93.8 kg    Ideal Body Weight:  44.3 kg  BMI:  Body mass index is 41.77 kg/m.  Estimated Nutritional Needs:   Kcal:  1800-2100kcal/day  Protein:  90-110g/d  Fluid:  1.4-1.6L/day  Koleen Distance MS, RD, LDN Please refer to [facility] for RD and/or RD on-call/weekend/after hours pager

## 2022-04-23 LAB — GLUCOSE, CAPILLARY
Glucose-Capillary: 141 mg/dL — ABNORMAL HIGH (ref 70–99)
Glucose-Capillary: 140 mg/dL — ABNORMAL HIGH (ref 70–99)
Glucose-Capillary: 124 mg/dL — ABNORMAL HIGH (ref 70–99)
Glucose-Capillary: 122 mg/dL — ABNORMAL HIGH (ref 70–99)

## 2022-04-23 MED ORDER — TRACE MINERALS CU-MN-SE-ZN 300-55-60-3000 MCG/ML IV SOLN
INTRAVENOUS | Status: AC
  Filled 2022-04-23: qty 707.2

## 2022-04-23 NOTE — Progress Notes (Signed)
PHARMACY - TOTAL PARENTERAL NUTRITION CONSULT NOTE   Indication: Prolonged ileus  Patient Measurements: Height: '4\' 11"'$  (149.9 cm) Weight: 93.6 kg (206 lb 5.6 oz) IBW/kg (Calculated) : 43.2 TPN AdjBW (KG): 55.2 Body mass index is 41.68 kg/m.  Assessment:  56 y.o. female s/p laparotomy, excision of greater omental mass, abdominal wall reconstruction with [name],appendectomy, and placement of Prevena vac.  Glucose / Insulin: No apparent history of diabetes. BG 119 - 154. preceding 24 hours.  9 units of SSI moderate required + 20 units insulin in TPN bag Electrolytes:  8/10: stable hyponatremia, all other electrolytes WNL (no change) Renal: Scr ~ 0.4 Hepatic: No transaminitis. LFTs within normal limits. TG within normal limits Intake/Output: Total I/O: + 2.2 L GI Imaging: GI Surgeries / Procedures: s/p laparotomy, excision of greater omental mass, abdominal wall reconstruction with [name], appendectomy, and placement of Prevena vac  Central access: 02/15/22 TPN start date: 02/15/22  Nutritional Goals: Goal TPN rate of 65 mL/hr (provides 106 g of protein and 1872 kcals per day)  RD Assessment:  Estimated Needs Total Energy Estimated Needs: 1800-2100kcal/day Total Protein Estimated Needs: 90-110g/d Total Fluid Estimated Needs: 1.4-1.6L/day  Current Nutrition:  CLD + nutritional supplements  Thin liquid diet 8/12- nausea after breakfast  Plan: Continue TPN at 11m/hr Nutritional Components Amino acids (using 15% Clinisol): 106 grams Dextrose: 265.2 grams Lipids (using 20% SMOFlipids): 54.6 grams kCal: 1872/24h  Electrolytes in TPN (standard): Na 539m/L, K 7075mL, Ca 5mE51m, Mg 10mE77m and Phos 25 mmol/L. Cl:Ac 1:1 Enteral multivitamin. Add trace elements to TPN Continue moderate SSI TIDM + CBG 4x/day (ACHS) + 20u insulin in TPN Monitor TPN labs on Mon/Thurs  CarolGlean SalvormD Clinical Pharmacist  04/23/2022 11:08 AM

## 2022-04-23 NOTE — Progress Notes (Addendum)
[place] Hospital Day(s): [place] op day(s): 67 Days Post-Op.   Interval History:  Patient seen and examined No issues overnight Some right ear pain this morning; now resolved No abdominal pain; nausea, emesis, fevers No new labs [name] Pouch output 300 ccs + unmeasured in last 24 hours; nothing recorded overnight She is on FLD; on TPN --> happy to have grits yesterday  Ambulating independently   Vital signs in last 24 hours: [min-max] current  Temp:  [98 F (36.7 C)-98.3 F (36.8 C)] 98 F (36.7 C) (08/16 0743) Pulse Rate:  [74-86] 74 (08/16 0743) Resp:  [16-18] 18 (08/16 0743) BP: (104-118)/(55-80) 104/55 (08/16 0743) SpO2:  [95 %-100 %] 95 % (08/16 0743) Weight:  [93.6 kg] 93.6 kg (08/16 0500)     Height: '4\' 11"'$  (149.9 cm) Weight: 93.6 kg BMI (Calculated): 43.04   Intake/Output last 2 shifts:  08/15 0701 - 08/16 0700 In: 1931.4 [P.O.:360; I.V.:1571.4] Out: 300 [Drains:300]   Physical Exam:  Constitutional: alert, cooperative and no distress  Respiratory: breathing non-labored at rest  Cardiovascular: regular rate and sinus rhythm  Gastrointestinal: Soft, abdominal soreness on the right, non-distended, no rebound/guarding. Integumentary: Midline wound open, peritoneum closed; granulating;  there are two stomatized areas visible in the LUQ and LLQ portions of the wound, output remains bilious;  Red rubbed in better position. [name] pouch without leak.  MSK: RUE edema improved; non-tender, peripheral pulses 2+   Labs:     Latest Ref Rng & Units 04/11/2022    1:27 PM 04/08/2022    8:52 AM 04/07/2022    4:24 AM  CBC  WBC 4.0 - 10.5 K/uL 4.6  4.0  3.8   Hemoglobin 12.0 - 15.0 g/dL 9.3  8.5  6.9   Hematocrit 36.0 - 46.0 % 30.0  26.8  22.3   Platelets 150 - 400 K/uL 174  132  147       Latest Ref Rng & Units 04/22/2022    4:57 AM 04/21/2022    9:50 PM 04/17/2022    5:14 AM  CMP  Glucose 70 - 99 mg/dL 149  122  140   BUN 6 -  20 mg/dL '20  18  18   '$ Creatinine 0.44 - 1.00 mg/dL 0.41  0.43  0.43   Sodium 135 - 145 mmol/L 132  131  131   Potassium 3.5 - 5.1 mmol/L 4.0  4.4  3.9   Chloride 98 - 111 mmol/L 102  101  103   CO2 22 - 32 mmol/L '24  25  24   '$ Calcium 8.9 - 10.3 mg/dL 8.2  8.3  7.9   Total Protein 6.5 - 8.1 g/dL  6.9  6.5   Total Bilirubin 0.3 - 1.2 mg/dL  0.5  0.5   Alkaline Phos 38 - 126 U/L  97  92   AST 15 - 41 U/L  26  24   ALT 0 - 44 U/L  16  15      Imaging studies:  No new imaging studies   Assessment/Plan: 56 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  - New 08/16: No new issues  - Wound Care: [name] pouch in place with red rubber present to manage drainage. Anticipate fluid will pool  in the wound bed while she is in bed. This has become semi-solid stool. It is okay to adjust the red rubber catheter from time to time to help with drainage while in bed but she remain in dependent portion of [name]. This can be removed from suction to ambulate and work with therapies. These pouches need changed every 7-10 days (ideally). Changed 08/11; next change 08/21 if no issues in interim. Surgical team will likely preform this and further excise mesh   - Trial FLD at patient request + nutritional supplementation - Continue TPN at goal; monitor electrolytes  - Monitor abdominal examination; on-going bowel function  - Pain control prn; antemetic prn  - Out of bed; discharged from therapies; no recommendations at this time   - Discharge Planning: Anticipate lengthy admission and potential eventual transfer   All of the above findings and recommendations were discussed with the patient, and the medical team, and all of patient's questions were  answered to her expressed satisfaction.  -- [name], PA-C [facility] 04/23/2022, 7:45 AM M-F: 7am - 4pm

## 2022-04-23 NOTE — Plan of Care (Signed)
  Problem: Clinical Measurements: Goal: Will remain free from infection Outcome: Progressing   Problem: Nutrition: Goal: Adequate nutrition will be maintained Outcome: Progressing   Problem: Activity: Goal: Risk for activity intolerance will decrease Outcome: Progressing   Problem: Nutrition: Goal: Adequate nutrition will be maintained Outcome: Progressing   Problem: Coping: Goal: Level of anxiety will decrease Outcome: Progressing   

## 2022-04-23 NOTE — Progress Notes (Signed)
Mobility Specialist - Progress Note    04/23/22 1500  Mobility  Activity Ambulated independently in hallway  Level of Assistance Independent  Assistive Device None  Distance Ambulated (ft) 320 ft  Activity Response Tolerated well  $Mobility charge 1 Mobility    Merrily Brittle Mobility Specialist 04/23/22, 3:27 PM

## 2022-04-24 LAB — COMPREHENSIVE METABOLIC PANEL
ALT: 17 U/L (ref 0–44)
AST: 25 U/L (ref 15–41)
Albumin: 2.5 g/dL — ABNORMAL LOW (ref 3.5–5.0)
Alkaline Phosphatase: 95 U/L (ref 38–126)
Anion gap: 4 — ABNORMAL LOW (ref 5–15)
BUN: 20 mg/dL (ref 6–20)
CO2: 25 mmol/L (ref 22–32)
Calcium: 7.9 mg/dL — ABNORMAL LOW (ref 8.9–10.3)
Chloride: 100 mmol/L (ref 98–111)
Creatinine, Ser: 0.35 mg/dL — ABNORMAL LOW (ref 0.44–1.00)
GFR, Estimated: >60 mL/min (ref >60)
Glucose, Bld: 154 mg/dL — ABNORMAL HIGH (ref 70–99)
Potassium: 4.3 mmol/L (ref 3.5–5.1)
Sodium: 129 mmol/L — ABNORMAL LOW (ref 135–145)
Total Bilirubin: 0.5 mg/dL (ref 0.3–1.2)
Total Protein: 6.4 g/dL — ABNORMAL LOW (ref 6.5–8.1)

## 2022-04-24 LAB — PHOSPHORUS: Phosphorus: 3.9 mg/dL (ref 2.5–4.6)

## 2022-04-24 LAB — TRIGLYCERIDES: Triglycerides: 52 mg/dL (ref <150)

## 2022-04-24 LAB — MAGNESIUM: Magnesium: 1.8 mg/dL (ref 1.7–2.4)

## 2022-04-24 LAB — GLUCOSE, CAPILLARY
Glucose-Capillary: 150 mg/dL — ABNORMAL HIGH (ref 70–99)
Glucose-Capillary: 144 mg/dL — ABNORMAL HIGH (ref 70–99)
Glucose-Capillary: 119 mg/dL — ABNORMAL HIGH (ref 70–99)
Glucose-Capillary: 110 mg/dL — ABNORMAL HIGH (ref 70–99)

## 2022-04-24 MED ORDER — TRACE MINERALS CU-MN-SE-ZN 300-55-60-3000 MCG/ML IV SOLN
INTRAVENOUS | Status: AC
  Filled 2022-04-24: qty 707.2

## 2022-04-24 MED ORDER — MAGNESIUM SULFATE 2 GM/50ML IV SOLN
2.0000 g | Freq: Once | INTRAVENOUS | Status: AC
  Administered 2022-04-24: 2 g via INTRAVENOUS
  Filled 2022-04-24: qty 50

## 2022-04-24 NOTE — Progress Notes (Signed)
[place] Hospital Day(s): [place] op day(s): 68 Days Post-Op.   Interval History:  Patient seen and examined No issues overnight No abdominal pain; nausea, emesis, fevers No new labs [name] Pouch output 300 ccs + unmeasured in last 24 hours; nothing recorded overnight She is on FLD; on TPN --> happy to have grits yesterday  Ambulating independently   Vital signs in last 24 hours: [min-max] current  Temp:  [98.3 F (36.8 C)-99.3 F (37.4 C)] 98.7 F (37.1 C) (08/17 0535) Pulse Rate:  [74-89] 74 (08/17 0535) Resp:  [16-19] 16 (08/17 0535) BP: (92-114)/(47-56) 92/47 (08/17 0535) SpO2:  [97 %-100 %] 100 % (08/17 0535) Weight:  [94.9 kg] 94.9 kg (08/17 0500)     Height: '4\' 11"'$  (149.9 cm) Weight: 94.9 kg BMI (Calculated): 43.04   Intake/Output last 2 shifts:  08/16 0701 - 08/17 0700 In: 719.2 [P.O.:120; I.V.:599.2] Out: 1200 [Drains:1200]   Physical Exam:  Constitutional: alert, cooperative and no distress  Respiratory: breathing non-labored at rest  Cardiovascular: regular rate and sinus rhythm  Gastrointestinal: Soft, abdominal soreness on the right, non-distended, no rebound/guarding. Integumentary: Midline wound open, peritoneum closed; granulating;  there are two stomatized areas visible in the LUQ and LLQ portions of the wound, output remains bilious;  Red rubbed in better position. [name] pouch without leak.  MSK: RUE edema improved; non-tender, peripheral pulses 2+   Labs:     Latest Ref Rng & Units 04/11/2022    1:27 PM 04/08/2022    8:52 AM 04/07/2022    4:24 AM  CBC  WBC 4.0 - 10.5 K/uL 4.6  4.0  3.8   Hemoglobin 12.0 - 15.0 g/dL 9.3  8.5  6.9   Hematocrit 36.0 - 46.0 % 30.0  26.8  22.3   Platelets 150 - 400 K/uL 174  132  147       Latest Ref Rng & Units 04/24/2022    5:18 AM 04/22/2022    4:57 AM 04/21/2022    9:50 PM  CMP  Glucose 70 - 99 mg/dL 154  149  122   BUN 6 - 20 mg/dL '20  20  18   '$ Creatinine 0.44 - 1.00  mg/dL 0.35  0.41  0.43   Sodium 135 - 145 mmol/L 129  132  131   Potassium 3.5 - 5.1 mmol/L 4.3  4.0  4.4   Chloride 98 - 111 mmol/L 100  102  101   CO2 22 - 32 mmol/L '25  24  25   '$ Calcium 8.9 - 10.3 mg/dL 7.9  8.2  8.3   Total Protein 6.5 - 8.1 g/dL 6.4   6.9   Total Bilirubin 0.3 - 1.2 mg/dL 0.5   0.5   Alkaline Phos 38 - 126 U/L 95   97   AST 15 - 41 U/L 25   26   ALT 0 - 44 U/L 17   16      Imaging studies:  No new imaging studies   Assessment/Plan: 56 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  - New 08/17: No new issues  - Wound Care: [name] pouch in place with red rubber present to manage drainage. Anticipate fluid will pool in the wound bed while she is in  bed. This has become semi-solid stool. It is okay to adjust the red rubber catheter from time to time to help with drainage while in bed but she remain in dependent portion of [name]. This can be removed from suction to ambulate and work with therapies. These pouches need changed every 7-10 days (ideally). Changed 08/11; next change 08/21 if no issues in interim. Surgical team will likely preform this and further excise mesh   - Trial FLD at patient request + nutritional supplementation - Continue TPN at goal; monitor electrolytes  - Monitor abdominal examination; on-going bowel function  - Pain control prn; antemetic prn  - Out of bed; discharged from therapies; no recommendations at this time   - Discharge Planning: Anticipate lengthy admission and potential eventual transfer   All of the above findings and recommendations were discussed with the patient, and the medical team, and all of patient's questions were answered to her expressed  satisfaction.  -- [name], PA-C [facility] 04/24/2022, 7:48 AM M-F: 7am - 4pm

## 2022-04-24 NOTE — Progress Notes (Signed)
Mobility Specialist - Progress Note   04/24/22 1400  Mobility  Activity Ambulated independently in hallway  Level of Assistance Modified independent, requires aide device or extra time  Distance Ambulated (ft) 320 ft  Activity Response Tolerated well  $Mobility charge 1 Mobility     Merrily Brittle Mobility Specialist 04/24/22, 2:51 PM

## 2022-04-24 NOTE — Progress Notes (Signed)
PHARMACY - TOTAL PARENTERAL NUTRITION CONSULT NOTE   Indication: Prolonged ileus  Patient Measurements: Height: '4\' 11"'$  (149.9 cm) Weight: 94.9 kg (209 lb 3.5 oz) IBW/kg (Calculated) : 43.2 TPN AdjBW (KG): 55.2 Body mass index is 42.26 kg/m.  Assessment:  56 y.o. female s/p laparotomy, excision of greater omental mass, abdominal wall reconstruction with [name],appendectomy, and placement of Prevena vac.  Glucose / Insulin: No apparent history of diabetes. BG 122-154 preceding 24 hours.  6 units of SSI moderate required + 20 units insulin in TPN bag Electrolytes:  8/10: stable hyponatremia, Mag 1.8  Ca 7.9   alb 2.5 Renal: Scr ~ 0.35 Hepatic: No transaminitis. LFTs within normal limits. TG within normal limits Intake/Output: Total I/O: + 2.2 L GI Imaging: GI Surgeries / Procedures: s/p laparotomy, excision of greater omental mass, abdominal wall reconstruction with [name], appendectomy, and placement of Prevena vac  Central access: 02/15/22 TPN start date: 02/15/22  Nutritional Goals: Goal TPN rate of 65 mL/hr (provides 106 g of protein and 1872 kcals per day)  RD Assessment:  Estimated Needs Total Energy Estimated Needs: 1800-2100kcal/day Total Protein Estimated Needs: 90-110g/d Total Fluid Estimated Needs: 1.4-1.6L/day  Current Nutrition:  CLD + nutritional supplements -trial of FLD 8/17 Thin liquid diet   Plan: Continue TPN at 48m/hr Nutritional Components Amino acids (using 15% Clinisol): 106 grams Dextrose: 265.2 grams Lipids (using 20% SMOFlipids): 54.6 grams kCal: 1872/24h  Electrolytes in TPN (standard): Na 527m/L (Na adjusted from 50 to 55 meq/L on 8/17), K 7082mL, Ca 5mE26m, Mg 10mE41m and Phos 25 mmol/L. Cl:Ac 1:1 Mag 1.8  will order magnesium sulfate 2 gm IV x1 Enteral multivitamin. Add trace elements to TPN Continue moderate SSI TIDM + CBG 4x/day (ACHS) + 20u insulin in TPN Monitor TPN labs on Mon/Thurs  KristChinita GreenlandrmD Clinical Pharmacist 04/24/2022

## 2022-04-24 NOTE — Plan of Care (Signed)
  Problem: Clinical Measurements: Goal: Will remain free from infection Outcome: Progressing   Problem: Nutrition: Goal: Adequate nutrition will be maintained Outcome: Progressing   Problem: Activity: Goal: Risk for activity intolerance will decrease Outcome: Progressing   Problem: Nutrition: Goal: Adequate nutrition will be maintained Outcome: Progressing   Problem: Coping: Goal: Level of anxiety will decrease Outcome: Progressing   Problem: Pain Managment: Goal: General experience of comfort will improve Outcome: Progressing   

## 2022-04-24 NOTE — Progress Notes (Signed)
Mobility Specialist - Progress Note    04/24/22 1200  Mobility  Activity Ambulated independently in hallway  Level of Assistance Modified independent, requires aide device or extra time  Assistive Device None;Other (Comment) (IV Pole)  Distance Ambulated (ft) 320 ft  Activity Response Tolerated well  $Mobility charge 1 Mobility    Merrily Brittle Mobility Specialist 04/24/22, 12:03 PM

## 2022-04-25 LAB — GLUCOSE, CAPILLARY
Glucose-Capillary: 130 mg/dL — ABNORMAL HIGH (ref 70–99)
Glucose-Capillary: 129 mg/dL — ABNORMAL HIGH (ref 70–99)
Glucose-Capillary: 121 mg/dL — ABNORMAL HIGH (ref 70–99)
Glucose-Capillary: 134 mg/dL — ABNORMAL HIGH (ref 70–99)

## 2022-04-25 MED ORDER — TRACE MINERALS CU-MN-SE-ZN 300-55-60-3000 MCG/ML IV SOLN
INTRAVENOUS | Status: AC
  Filled 2022-04-25: qty 707.2

## 2022-04-25 NOTE — Consult Note (Signed)
Renick Nurse wound follow up Patient receiving care in [facility]. I stopped by this morning to check the [name] pouch to wall suction for the abdominal wound with fistulae.  It is intact, no signs of impending leakage, draining well, red rubber catheter in good position and not in the wound bed. Supplies are in her room for future use, along with a pattern.  No new needs identified at this time.  Val Riles, RN, MSN, CWOCN, CNS-BC, pager [phone]

## 2022-04-25 NOTE — Progress Notes (Signed)
[facility] Day(s): 34.   Post op day(s): 69 Days Post-Op.   Interval History:  Patient seen and examined No issues overnight No abdominal pain; nausea, emesis, fevers No new labs [name] Pouch output 300 ccs + unmeasured in last 24 hours; nothing recorded overnight She is on FLD; on TPN --> happy to have grits yesterday  Ambulating independently   Vital signs in last 24 hours: [min-max] current  Temp:  [98.3 F (36.8 C)-98.9 F (37.2 C)] 98.3 F (36.8 C) (08/18 0809) Pulse Rate:  [71-81] 71 (08/18 0809) Resp:  [13-18] 18 (08/18 0809) BP: (99-108)/(50-61) 104/61 (08/18 0809) SpO2:  [99 %-100 %] 100 % (08/18 0809) Weight:  [95.1 kg] 95.1 kg (08/18 0500)     Height: '4\' 11"'$  (149.9 cm) Weight: 95.1 kg BMI (Calculated): 43.04   Intake/Output last 2 shifts:  08/17 0701 - 08/18 0700 In: 1612.4 [P.O.:420; I.V.:1192.4] Out: 200 [Drains:200]   Physical Exam:  Constitutional: alert, cooperative and no distress  Respiratory: breathing non-labored at rest  Cardiovascular: regular rate and sinus rhythm  Gastrointestinal: Soft, abdominal soreness on the right, non-distended, no rebound/guarding. Integumentary: Midline wound open, peritoneum closed; granulating;  there are two stomatized areas visible in the LUQ and LLQ portions of the wound, output remains bilious;  Red rubbed in better position. [name] pouch without leak.  MSK: RUE edema improved; non-tender, peripheral pulses 2+   Labs:     Latest Ref Rng & Units 04/11/2022    1:27 PM 04/08/2022    8:52 AM 04/07/2022    4:24 AM  CBC  WBC 4.0 - 10.5 K/uL 4.6  4.0  3.8   Hemoglobin 12.0 - 15.0 g/dL 9.3  8.5  6.9   Hematocrit 36.0 - 46.0 % 30.0  26.8  22.3   Platelets 150 - 400 K/uL 174  132  147       Latest Ref Rng & Units 04/24/2022    5:18 AM 04/22/2022    4:57 AM 04/21/2022    9:50 PM  CMP  Glucose 70 - 99 mg/dL 154  149  122   BUN 6 - 20 mg/dL '20  20  18   '$ Creatinine 0.44 -  1.00 mg/dL 0.35  0.41  0.43   Sodium 135 - 145 mmol/L 129  132  131   Potassium 3.5 - 5.1 mmol/L 4.3  4.0  4.4   Chloride 98 - 111 mmol/L 100  102  101   CO2 22 - 32 mmol/L '25  24  25   '$ Calcium 8.9 - 10.3 mg/dL 7.9  8.2  8.3   Total Protein 6.5 - 8.1 g/dL 6.4   6.9   Total Bilirubin 0.3 - 1.2 mg/dL 0.5   0.5   Alkaline Phos 38 - 126 U/L 95   97   AST 15 - 41 U/L 25   26   ALT 0 - 44 U/L 17   16      Imaging studies:  No new imaging studies   Assessment/Plan: 56 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  - New 08/18: No new issues  - Wound Care: [name] pouch in place with red rubber present to manage drainage. Anticipate fluid will pool in the wound bed while she is in  bed. This has become semi-solid stool. It is okay to adjust the red rubber catheter from time to time to help with drainage while in bed but she remain in dependent portion of [name]. This can be removed from suction to ambulate and work with therapies. These pouches need changed every 7-10 days (ideally). Changed 08/11; next change 08/21 if no issues in interim. Surgical team will likely preform this and further excise mesh   - Trial FLD at patient request + nutritional supplementation - Continue TPN at goal; monitor electrolytes  - Monitor abdominal examination; on-going bowel function  - Pain control prn; antemetic prn  - Out of bed; discharged from therapies; no recommendations at this time   - Discharge Planning: Anticipate lengthy admission and potential eventual transfer   All of the above findings and recommendations were discussed with the patient, and the medical team, and all of patient's questions were answered to her expressed  satisfaction.  -- [name], PA-C [facility] 04/25/2022, 8:48 AM M-F: 7am - 4pm

## 2022-04-25 NOTE — Progress Notes (Signed)
PHARMACY - TOTAL PARENTERAL NUTRITION CONSULT NOTE   Indication: Prolonged ileus  Patient Measurements: Height: '4\' 11"'$  (149.9 cm) Weight: 95.1 kg (209 lb 10.5 oz) IBW/kg (Calculated) : 43.2 TPN AdjBW (KG): 55.2 Body mass index is 42.35 kg/m.  Assessment:  56 y.o. female s/p laparotomy, excision of greater omental mass, abdominal wall reconstruction with [name],appendectomy, and placement of Prevena vac.  Glucose / Insulin: No apparent history of diabetes. BG 110 - 150 preceding 24 hours.  2 units of SSI moderate required + 20 units insulin in TPN bag Electrolytes:  8/10: stable hyponatremia, Mag 1.8  Ca 7.9   alb 2.5 Renal: Scr ~ 0.35 Hepatic: No transaminitis. LFTs within normal limits. TG within normal limits Intake/Output: 08/17 0701 - 08/18 0700 In: 1612.4 [P.O.:420; I.V.:1192.4] Out: 200 [Drains:200]  GI Imaging: GI Surgeries / Procedures: s/p laparotomy, excision of greater omental mass, abdominal wall reconstruction with [name], appendectomy, and placement of Prevena vac  Central access: 02/15/22 TPN start date: 02/15/22  Nutritional Goals: Goal TPN rate of 65 mL/hr (provides 106 g of protein and 1872 kcals per day)  RD Assessment:  Estimated Needs Total Energy Estimated Needs: 1800-2100kcal/day Total Protein Estimated Needs: 90-110g/d Total Fluid Estimated Needs: 1.4-1.6L/day  Current Nutrition:  CLD + nutritional supplements -trial of FLD 8/17 Thin liquid diet   Plan: Continue TPN at 76m/hr Nutritional Components Amino acids (using 15% Clinisol): 106 grams Dextrose: 265.2 grams Lipids (using 20% SMOFlipids): 54.6 grams kCal: 1872/24h  Electrolytes in TPN (standard): Na 557m/L (Na adjusted from 50 to 55 meq/L on 8/17), K 7041mL, Ca 5mE33m, Mg 10mE23m and Phos 25 mmol/L. Cl:Ac 1:1 Mag 1.8  will order magnesium sulfate 2 gm IV x1 Enteral multivitamin. Add trace elements to TPN Continue moderate SSI TIDM + CBG 4x/day (ACHS) +  15u insulin in TPN Monitor TPN labs on Mon/Thurs  RodneVallery SarmD, BCPS Clinical Pharmacist 04/25/2022

## 2022-04-26 LAB — GLUCOSE, CAPILLARY
Glucose-Capillary: 135 mg/dL — ABNORMAL HIGH (ref 70–99)
Glucose-Capillary: 123 mg/dL — ABNORMAL HIGH (ref 70–99)
Glucose-Capillary: 115 mg/dL — ABNORMAL HIGH (ref 70–99)
Glucose-Capillary: 142 mg/dL — ABNORMAL HIGH (ref 70–99)

## 2022-04-26 MED ORDER — TRACE MINERALS CU-MN-SE-ZN 300-55-60-3000 MCG/ML IV SOLN
INTRAVENOUS | Status: AC
  Filled 2022-04-26: qty 707.2

## 2022-04-26 NOTE — Progress Notes (Signed)
Subjective:  CC: Debra [name] is a 56 y.o. female  Hospital stay day 80, 70 Days Post-Op high output enterocutaneous fistula. s/p re-opening of laparotomy for repair of small bowel perforation following initial laparotomy, excision of greater omental mass, abdominal wall reconstruction with [name], appendectomy, and placement of Prevena vac on 06/08.  HPI: No changes overnight.  ROS:  General: Denies weight loss, weight gain, fatigue, fevers, chills, and night sweats. Heart: Denies chest pain, palpitations, racing heart, irregular heartbeat, leg pain or swelling, and decreased activity tolerance. Respiratory: Denies breathing difficulty, shortness of breath, wheezing, cough, and sputum. GI: Denies change in appetite, heartburn, nausea, vomiting, constipation, diarrhea, and blood in stool. GU: Denies difficulty urinating, pain with urinating, urgency, frequency, blood in urine.   Objective:   Temp:  [98.3 F (36.8 C)-98.6 F (37 C)] 98.6 F (37 C) (08/19 0834) Pulse Rate:  [70-73] 73 (08/19 0834) Resp:  [18-20] 18 (08/19 0834) BP: (94-107)/(50-56) 94/50 (08/19 0834) SpO2:  [96 %-99 %] 96 % (08/19 0834) Weight:  [95.4 kg] 95.4 kg (08/19 0452)     Height: '4\' 11"'$  (149.9 cm) Weight: 95.4 kg BMI (Calculated): 43.04   Intake/Output this shift:   Intake/Output Summary (Last 24 hours) at 04/26/2022 1029 Last data filed at 04/26/2022 0600 Gross per 24 hour  Intake 1348.94 ml  Output 1200 ml  Net 148.94 ml    Constitutional :  alert, cooperative, appears stated age, and no distress  Respiratory:  clear to auscultation bilaterally  Cardiovascular:  regular rate and rhythm  Gastrointestinal: Soft, no tenderness to palpation.  [name] pouch intact and midline draining feculent output as previously noted .  No evidence of recent leak.  Skin: Cool and moist.   Psychiatric: Normal affect, non-agitated, not confused       LABS:     Latest Ref Rng & Units 04/24/2022     5:18 AM 04/22/2022    4:57 AM 04/21/2022    9:50 PM  CMP  Glucose 70 - 99 mg/dL 154  149  122   BUN 6 - 20 mg/dL '20  20  18   '$ Creatinine 0.44 - 1.00 mg/dL 0.35  0.41  0.43   Sodium 135 - 145 mmol/L 129  132  131   Potassium 3.5 - 5.1 mmol/L 4.3  4.0  4.4   Chloride 98 - 111 mmol/L 100  102  101   CO2 22 - 32 mmol/L '25  24  25   '$ Calcium 8.9 - 10.3 mg/dL 7.9  8.2  8.3   Total Protein 6.5 - 8.1 g/dL 6.4   6.9   Total Bilirubin 0.3 - 1.2 mg/dL 0.5   0.5   Alkaline Phos 38 - 126 U/L 95   97   AST 15 - 41 U/L 25   26   ALT 0 - 44 U/L 17   16       Latest Ref Rng & Units 04/11/2022    1:27 PM 04/08/2022    8:52 AM 04/07/2022    4:24 AM  CBC  WBC 4.0 - 10.5 K/uL 4.6  4.0  3.8   Hemoglobin 12.0 - 15.0 g/dL 9.3  8.5  6.9   Hematocrit 36.0 - 46.0 % 30.0  26.8  22.3   Platelets 150 - 400 K/uL 174  132  147     RADS: N/a Assessment:   high output enterocutaneous fistula. s/p re-opening of laparotomy for repair of small bowel perforation following initial laparotomy, excision of  greater omental mass, abdominal wall reconstruction with [name], appendectomy, and placement of Prevena vac on 06/08.  Stable.  We will continue current management in anticipation of next wound VAC change in a couple days  labs/images/medications/previous chart entries reviewed personally and relevant changes/updates noted above.

## 2022-04-26 NOTE — Plan of Care (Signed)
Pt AAOx4, no pain. VS are WNL. Plan for ambulation as tolerated. TPN infusing at goal rate. Bed in lowest position, call light within reach. Will continue to monitor.

## 2022-04-26 NOTE — Progress Notes (Signed)
PHARMACY - TOTAL PARENTERAL NUTRITION CONSULT NOTE   Indication: Prolonged ileus  Patient Measurements: Height: '4\' 11"'$  (149.9 cm) Weight: 95.4 kg (210 lb 5.1 oz) IBW/kg (Calculated) : 43.2 TPN AdjBW (KG): 55.2 Body mass index is 42.48 kg/m.  Assessment:  56 y.o. female s/p laparotomy, excision of greater omental mass, abdominal wall reconstruction with [name],appendectomy, and placement of Prevena vac.  Glucose / Insulin: No apparent history of diabetes. BG 110 - 134 preceding 24 hours.  2 units of SSI moderate required + 15 units insulin in TPN bag Electrolytes:  8/10: stable hyponatremia, Mag 1.8  Ca 7.9   alb 2.5 Renal: Scr ~ 0.35 Hepatic: No transaminitis. LFTs within normal limits. TG within normal limits Intake/Output: 08/18 0701 - 08/18 0700 In: 1348.9 [P.O.:120; I.V.:1228.9] Out: 1850 [Drains:200]  GI Imaging: GI Surgeries / Procedures: s/p laparotomy, excision of greater omental mass, abdominal wall reconstruction with [name], appendectomy, and placement of Prevena vac  Central access: 02/15/22 TPN start date: 02/15/22  Nutritional Goals: Goal TPN rate of 65 mL/hr (provides 106 g of protein and 1872 kcals per day)  RD Assessment:  Estimated Needs Total Energy Estimated Needs: 1800-2100kcal/day Total Protein Estimated Needs: 90-110g/d Total Fluid Estimated Needs: 1.4-1.6L/day  Current Nutrition:  CLD + nutritional supplements -trial of FLD 8/17 Thin liquid diet   Plan: Continue TPN at 79m/hr Nutritional Components Amino acids (using 15% Clinisol): 106 grams Dextrose: 265.2 grams Lipids (using 20% SMOFlipids): 54.6 grams kCal: 1872/24h  Electrolytes in TPN (standard): Na 573m/L (Na adjusted from 50 to 55 meq/L on 8/17), K 7080mL, Ca 5mE89m, Mg 10mE50m and Phos 25 mmol/L. Cl:Ac 1:1 Enteral multivitamin. Add trace elements to TPN Continue moderate SSI TIDM + CBG 4x/day (ACHS) + 15u insulin in TPN Monitor TPN labs on  Mon/Thurs  WalidPearla DubonnetrmD Clinical Pharmacist 04/26/2022 8:34 AM

## 2022-04-27 LAB — GLUCOSE, CAPILLARY
Glucose-Capillary: 110 mg/dL — ABNORMAL HIGH (ref 70–99)
Glucose-Capillary: 124 mg/dL — ABNORMAL HIGH (ref 70–99)
Glucose-Capillary: 130 mg/dL — ABNORMAL HIGH (ref 70–99)

## 2022-04-27 MED ORDER — TRACE MINERALS CU-MN-SE-ZN 300-55-60-3000 MCG/ML IV SOLN
INTRAVENOUS | Status: AC
  Filled 2022-04-27: qty 707.2

## 2022-04-27 NOTE — Progress Notes (Signed)
PHARMACY - TOTAL PARENTERAL NUTRITION CONSULT NOTE   Indication: Prolonged ileus  Patient Measurements: Height: '4\' 11"'$  (149.9 cm) Weight: 94.1 kg (207 lb 7.3 oz) IBW/kg (Calculated) : 43.2 TPN AdjBW (KG): 55.2 Body mass index is 41.9 kg/m.  Assessment:  56 y.o. female s/p laparotomy, excision of greater omental mass, abdominal wall reconstruction with [name],appendectomy, and placement of Prevena vac.  Glucose / Insulin: No apparent history of diabetes. BG 110 - 142 preceding 24 hours.  4 units of SSI moderate required + 15 units insulin in TPN bag Electrolytes:  8/10: stable hyponatremia, Mag 1.8  Ca 7.9   alb 2.5 Renal: Scr ~ 0.35 Hepatic: No transaminitis. LFTs within normal limits. TG within normal limits Intake/Output: 08/19 0701 - 08/20 0700 In: 1180.9 [P.O.:480; I.V.:700.9] Out: 850 [Drains:850]  GI Imaging: GI Surgeries / Procedures: s/p laparotomy, excision of greater omental mass, abdominal wall reconstruction with [name], appendectomy, and placement of Prevena vac  Central access: 02/15/22 TPN start date: 02/15/22  Nutritional Goals: Goal TPN rate of 65 mL/hr (provides 106 g of protein and 1872 kcals per day)  RD Assessment:  Estimated Needs Total Energy Estimated Needs: 1800-2100kcal/day Total Protein Estimated Needs: 90-110g/d Total Fluid Estimated Needs: 1.4-1.6L/day  Current Nutrition:  CLD + nutritional supplements -trial of FLD 8/17 Thin liquid diet   Plan: Continue TPN at 77m/hr Nutritional Components Amino acids (using 15% Clinisol): 106 grams Dextrose: 265.2 grams Lipids (using 20% SMOFlipids): 54.6 grams kCal: 1872/24h  Electrolytes in TPN (standard): Na 522m/L (Na adjusted from 50 to 55 meq/L on 8/17), K 7047mL, Ca 5mE61m, Mg 10mE6m and Phos 25 mmol/L. Cl:Ac 1:1 Enteral multivitamin. Add trace elements to TPN Continue moderate SSI TIDM + CBG 4x/day (ACHS) + 15u insulin in TPN Monitor TPN labs on  Mon/Thurs  WalidPearla DubonnetrmD Clinical Pharmacist 04/27/2022 8:51 AM

## 2022-04-27 NOTE — Progress Notes (Signed)
Subjective:  CC: Debra [name] is a 56 y.o. female  Hospital stay day 38, 71 Days Post-Op high output enterocutaneous fistula. s/p re-opening of laparotomy for repair of small bowel perforation following initial laparotomy, excision of greater omental mass, abdominal wall reconstruction with [name], appendectomy, and placement of Prevena vac on 06/08.  HPI: No changes overnight.  ROS:  General: Denies weight loss, weight gain, fatigue, fevers, chills, and night sweats. Heart: Denies chest pain, palpitations, racing heart, irregular heartbeat, leg pain or swelling, and decreased activity tolerance. Respiratory: Denies breathing difficulty, shortness of breath, wheezing, cough, and sputum. GI: Denies change in appetite, heartburn, nausea, vomiting, constipation, diarrhea, and blood in stool. GU: Denies difficulty urinating, pain with urinating, urgency, frequency, blood in urine.   Objective:   Temp:  [98.4 F (36.9 C)-98.8 F (37.1 C)] 98.5 F (36.9 C) (08/20 0458) Pulse Rate:  [72-74] 72 (08/20 0458) Resp:  [16-18] 17 (08/20 0458) BP: (94-110)/(50-64) 110/64 (08/20 0458) SpO2:  [96 %-99 %] 99 % (08/20 0458) Weight:  [94.1 kg] 94.1 kg (08/20 0500)     Height: '4\' 11"'$  (149.9 cm) Weight: 94.1 kg BMI (Calculated): 43.04   Intake/Output this shift:   Intake/Output Summary (Last 24 hours) at 04/27/2022 0813 Last data filed at 04/27/2022 3428 Gross per 24 hour  Intake 1180.88 ml  Output 850 ml  Net 330.88 ml    Constitutional :  alert, cooperative, appears stated age, and no distress  Respiratory:  clear to auscultation bilaterally  Cardiovascular:  regular rate and rhythm  Gastrointestinal: Soft, no tenderness to palpation.  [name] pouch intact .  No evidence of recent leak.  Skin: Cool and moist.   Psychiatric: Normal affect, non-agitated, not confused       LABS:     Latest Ref Rng & Units 04/24/2022    5:18 AM 04/22/2022    4:57 AM 04/21/2022    9:50 PM   CMP  Glucose 70 - 99 mg/dL 154  149  122   BUN 6 - 20 mg/dL '20  20  18   '$ Creatinine 0.44 - 1.00 mg/dL 0.35  0.41  0.43   Sodium 135 - 145 mmol/L 129  132  131   Potassium 3.5 - 5.1 mmol/L 4.3  4.0  4.4   Chloride 98 - 111 mmol/L 100  102  101   CO2 22 - 32 mmol/L '25  24  25   '$ Calcium 8.9 - 10.3 mg/dL 7.9  8.2  8.3   Total Protein 6.5 - 8.1 g/dL 6.4   6.9   Total Bilirubin 0.3 - 1.2 mg/dL 0.5   0.5   Alkaline Phos 38 - 126 U/L 95   97   AST 15 - 41 U/L 25   26   ALT 0 - 44 U/L 17   16       Latest Ref Rng & Units 04/11/2022    1:27 PM 04/08/2022    8:52 AM 04/07/2022    4:24 AM  CBC  WBC 4.0 - 10.5 K/uL 4.6  4.0  3.8   Hemoglobin 12.0 - 15.0 g/dL 9.3  8.5  6.9   Hematocrit 36.0 - 46.0 % 30.0  26.8  22.3   Platelets 150 - 400 K/uL 174  132  147     RADS: N/a Assessment:   high output enterocutaneous fistula. s/p re-opening of laparotomy for repair of small bowel perforation following initial laparotomy, excision of greater omental mass, abdominal wall reconstruction with Evelene Croon  [place] release, appendectomy, and placement of Prevena vac on 06/08.  Stable.  We will continue current management. Possible VAC change tomorrow  labs/images/medications/previous chart entries reviewed personally and relevant changes/updates noted above.

## 2022-04-28 LAB — MAGNESIUM: Magnesium: 1.8 mg/dL (ref 1.7–2.4)

## 2022-04-28 LAB — COMPREHENSIVE METABOLIC PANEL
ALT: 15 U/L (ref 0–44)
AST: 22 U/L (ref 15–41)
Albumin: 2.5 g/dL — ABNORMAL LOW (ref 3.5–5.0)
Alkaline Phosphatase: 100 U/L (ref 38–126)
Anion gap: 5 (ref 5–15)
BUN: 21 mg/dL — ABNORMAL HIGH (ref 6–20)
CO2: 26 mmol/L (ref 22–32)
Calcium: 8.2 mg/dL — ABNORMAL LOW (ref 8.9–10.3)
Chloride: 102 mmol/L (ref 98–111)
Creatinine, Ser: 0.4 mg/dL — ABNORMAL LOW (ref 0.44–1.00)
GFR, Estimated: >60 mL/min (ref >60)
Glucose, Bld: 129 mg/dL — ABNORMAL HIGH (ref 70–99)
Potassium: 4.1 mmol/L (ref 3.5–5.1)
Sodium: 133 mmol/L — ABNORMAL LOW (ref 135–145)
Total Bilirubin: 0.4 mg/dL (ref 0.3–1.2)
Total Protein: 6.3 g/dL — ABNORMAL LOW (ref 6.5–8.1)

## 2022-04-28 LAB — PHOSPHORUS: Phosphorus: 3.6 mg/dL (ref 2.5–4.6)

## 2022-04-28 LAB — TRIGLYCERIDES: Triglycerides: 54 mg/dL (ref <150)

## 2022-04-28 LAB — GLUCOSE, CAPILLARY
Glucose-Capillary: 142 mg/dL — ABNORMAL HIGH (ref 70–99)
Glucose-Capillary: 130 mg/dL — ABNORMAL HIGH (ref 70–99)
Glucose-Capillary: 124 mg/dL — ABNORMAL HIGH (ref 70–99)
Glucose-Capillary: 155 mg/dL — ABNORMAL HIGH (ref 70–99)
Glucose-Capillary: 158 mg/dL — ABNORMAL HIGH (ref 70–99)

## 2022-04-28 MED ORDER — TRACE MINERALS CU-MN-SE-ZN 300-55-60-3000 MCG/ML IV SOLN
INTRAVENOUS | Status: AC
  Filled 2022-04-28: qty 707.2

## 2022-04-28 NOTE — Progress Notes (Signed)
PHARMACY - TOTAL PARENTERAL NUTRITION CONSULT NOTE   Indication: Prolonged ileus  Patient Measurements: Height: '4\' 11"'$  (149.9 cm) Weight: 94.1 kg (207 lb 7.3 oz) IBW/kg (Calculated) : 43.2 TPN AdjBW (KG): 55.2 Body mass index is 41.9 kg/m.  Assessment:  56 y.o. female s/p laparotomy, excision of greater omental mass, abdominal wall reconstruction with [name],appendectomy, and placement of Prevena vac.  Glucose / Insulin: No apparent history of diabetes. BG 110 - 142 preceding 24 hours.  4 units of SSI moderate required + 15 units insulin in TPN bag Electrolytes:  8/10: stable hyponatremia (129>133), Mag 1.8  Ca 8.2   alb 2.5 Renal: Scr ~0.35-0.4 Hepatic: No transaminitis. LFTs within normal limits. TG within normal limits Intake/Output: 08/20 0701 - 08/21 0700 In: 791.2 [P.O.:0; I.V.:700.9] Out: 1300 [Drains:650]  GI Imaging: GI Surgeries / Procedures: s/p laparotomy, excision of greater omental mass, abdominal wall reconstruction with [name], appendectomy, and placement of Prevena vac  Central access: 02/15/22 TPN start date: 02/15/22  Nutritional Goals: Goal TPN rate of 65 mL/hr (provides 106 g of protein and 1872 kcals per day)  RD Assessment:  Estimated Needs Total Energy Estimated Needs: 1800-2100kcal/day Total Protein Estimated Needs: 90-110g/d Total Fluid Estimated Needs: 1.4-1.6L/day  Current Nutrition:  CLD + nutritional supplements -trial of FLD 8/17 Thin liquid diet   Plan: Continue TPN at 8m/hr Nutritional Components Amino acids (using 15% Clinisol): 106 grams Dextrose: 265.2 grams Lipids (using 20% SMOFlipids): 54.6 grams kCal: 1872/24h  Electrolytes in TPN (standard): Na 564m/L (Na adjusted from 50 to 55 meq/L on 8/17), K 7049mL, Ca 5mE55m, Mg 10mE78m and Phos 25 mmol/L. Cl:Ac 1:1 Enteral multivitamin. Add trace elements to TPN Continue moderate SSI TIDM + CBG 4x/day (ACHS) + 15u insulin in TPN Monitor TPN labs on  Mon/Thurs  BrandLorna DibblermD Clinical Pharmacist 04/28/2022 11:53 AM

## 2022-04-28 NOTE — Progress Notes (Signed)
[place] Hospital Day(s): 55.   Post op day(s): 72 Days Post-Op.   Interval History:  Patient seen and examined No issues overnight No abdominal pain; nausea, emesis, fevers Nutritional labs are reassuring [name] Pouch output 650 ccs + unmeasured in last 24 hours; nothing recorded overnight She is on FLD; on TPN Ambulating independently   Vital signs in last 24 hours: [min-max] current  Temp:  [98.4 F (36.9 C)-98.8 F (37.1 C)] 98.8 F (37.1 C) (08/21 0649) Pulse Rate:  [78-96] 79 (08/21 0649) Resp:  [16-20] 16 (08/21 0649) BP: (80-120)/(42-59) 101/52 (08/21 0649) SpO2:  [96 %-100 %] 96 % (08/21 0649)     Height: '4\' 11"'$  (149.9 cm) Weight: 94.1 kg BMI (Calculated): 43.04   Intake/Output last 2 shifts:  08/20 0701 - 08/21 0700 In: 791.2 [I.V.:791.2] Out: 1300 [Drains:650; Stool:650]   Physical Exam:  Constitutional: alert, cooperative and no distress  Respiratory: breathing non-labored at rest  Cardiovascular: regular rate and sinus rhythm  Gastrointestinal: Soft, abdominal soreness on the right, non-distended, no rebound/guarding. Integumentary: Midline wound open, peritoneum closed; granulating;  there are three stomatized areas visible in the LUQ and LLQ portions of the wound, output remains bilious;  Red rubbed in better position. [name] pouch without leak.  MSK: RUE edema improved; non-tender, peripheral pulses 2+   Midline Wound (04/28/2022):     Labs:     Latest Ref Rng & Units 04/11/2022    1:27 PM 04/08/2022    8:52 AM 04/07/2022    4:24 AM  CBC  WBC 4.0 - 10.5 K/uL 4.6  4.0  3.8   Hemoglobin 12.0 - 15.0 g/dL 9.3  8.5  6.9   Hematocrit 36.0 - 46.0 % 30.0  26.8  22.3   Platelets 150 - 400 K/uL 174  132  147       Latest Ref Rng & Units 04/28/2022    4:55 AM 04/24/2022    5:18 AM 04/22/2022    4:57 AM  CMP  Glucose 70 - 99 mg/dL 129  154  149   BUN 6 - 20 mg/dL '21  20  20   '$ Creatinine 0.44 - 1.00 mg/dL 0.40  0.35   0.41   Sodium 135 - 145 mmol/L 133  129  132   Potassium 3.5 - 5.1 mmol/L 4.1  4.3  4.0   Chloride 98 - 111 mmol/L 102  100  102   CO2 22 - 32 mmol/L '26  25  24   '$ Calcium 8.9 - 10.3 mg/dL 8.2  7.9  8.2   Total Protein 6.5 - 8.1 g/dL 6.3  6.4    Total Bilirubin 0.3 - 1.2 mg/dL 0.4  0.5    Alkaline Phos 38 - 126 U/L 100  95    AST 15 - 41 U/L 22  25    ALT 0 - 44 U/L 15  17       Imaging studies:  No new imaging studies   Assessment/Plan: 56 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  - New 08/21: New [name] pouch changed, more mesh excised from wound. This is making gradual progress  - Wound Care: [name] pouch in place with red rubber present to manage drainage. Anticipate fluid will pool in the wound bed  while she is in bed. This has become semi-solid stool. It is okay to adjust the red rubber catheter from time to time to help with drainage while in bed but she remain in dependent portion of [name]. This can be removed from suction to ambulate and work with therapies. These pouches need changed every 7-10 days (ideally). Changed 08/21...Marland Kitchennext change 08/31 if no issues   - Trial FLD at patient request + nutritional supplementation - Continue TPN at goal; monitor electrolytes  - Monitor abdominal examination; on-going bowel function  - Pain control prn; antemetic prn  - Out of bed; discharged from therapies; no recommendations at this time   - Discharge Planning: Anticipate lengthy admission and potential eventual transfer   All of the above findings and recommendations were discussed with the patient, and the medical team, and all of patient's questions were answered to her expressed satisfaction.  -- [name],  PA-C [facility] 04/28/2022, 7:36 AM M-F: 7am - 4pm

## 2022-04-29 LAB — GLUCOSE, CAPILLARY
Glucose-Capillary: 166 mg/dL — ABNORMAL HIGH (ref 70–99)
Glucose-Capillary: 132 mg/dL — ABNORMAL HIGH (ref 70–99)
Glucose-Capillary: 135 mg/dL — ABNORMAL HIGH (ref 70–99)
Glucose-Capillary: 147 mg/dL — ABNORMAL HIGH (ref 70–99)

## 2022-04-29 LAB — CBC
HCT: 25.1 % — ABNORMAL LOW (ref 36.0–46.0)
Hemoglobin: 8 g/dL — ABNORMAL LOW (ref 12.0–15.0)
MCH: 25.3 pg — ABNORMAL LOW (ref 26.0–34.0)
MCHC: 31.9 g/dL (ref 30.0–36.0)
MCV: 79.4 fL — ABNORMAL LOW (ref 80.0–100.0)
Platelets: 123 10*3/uL — ABNORMAL LOW (ref 150–400)
RBC: 3.16 MIL/uL — ABNORMAL LOW (ref 3.87–5.11)
RDW: 16.5 % — ABNORMAL HIGH (ref 11.5–15.5)
WBC: 3.9 10*3/uL — ABNORMAL LOW (ref 4.0–10.5)
nRBC: 0 % (ref 0.0–0.2)

## 2022-04-29 MED ORDER — TRACE MINERALS CU-MN-SE-ZN 300-55-60-3000 MCG/ML IV SOLN
INTRAVENOUS | Status: AC
  Filled 2022-04-29: qty 707.2

## 2022-04-29 NOTE — Plan of Care (Signed)
  Problem: Clinical Measurements: Goal: Will remain free from infection Outcome: Progressing   Problem: Activity: Goal: Risk for activity intolerance will decrease 04/29/2022 2334 by Liliane Channel, RN Outcome: Progressing 04/29/2022 2331 by Liliane Channel, RN Outcome: Progressing   Problem: Nutrition: Goal: Adequate nutrition will be maintained Outcome: Progressing   Problem: Pain Managment: Goal: General experience of comfort will improve Outcome: Progressing

## 2022-04-29 NOTE — Progress Notes (Signed)
[place] Hospital Day(s): [place] op day(s): 73 Days Post-Op.   Interval History:  Patient seen and examined No issues overnight No abdominal pain; nausea, emesis, fevers Mild leukopenia today; 3.9K Hgb to 8.0; suspect dilutional and combination of chronic disease  [name] Pouch output 1055 ccs in last 24 hours She is on FLD; on TPN Ambulating independently   Vital signs in last 24 hours: [min-max] current  Temp:  [98.2 F (36.8 C)-99.2 F (37.3 C)] 98.5 F (36.9 C) (08/22 0515) Pulse Rate:  [77-97] 77 (08/22 0515) Resp:  [18-20] 20 (08/22 0515) BP: (100-117)/(57-84) 100/57 (08/22 0515) SpO2:  [95 %-100 %] 95 % (08/22 0515) Weight:  [93.7 kg] 93.7 kg (08/22 0500)     Height: '4\' 11"'$  (149.9 cm) Weight: 93.7 kg BMI (Calculated): 43.04   Intake/Output last 2 shifts:  08/21 0701 - 08/22 0700 In: 2584.8 [P.O.:1260; I.V.:1324.8] Out: 1055 [Drains:1055]   Physical Exam:  Constitutional: alert, cooperative and no distress  Respiratory: breathing non-labored at rest  Cardiovascular: regular rate and sinus rhythm  Gastrointestinal: Soft, abdominal soreness on the right, non-distended, no rebound/guarding. Integumentary: Midline wound open, peritoneum closed; granulating;  there are three stomatized areas visible in the LUQ and LLQ portions of the wound, output remains bilious;  Red rubbed in better position. [name] pouch without leak.  MSK: RUE edema improved; non-tender, peripheral pulses 2+   Labs:     Latest Ref Rng & Units 04/29/2022    5:26 AM 04/11/2022    1:27 PM 04/08/2022    8:52 AM  CBC  WBC 4.0 - 10.5 K/uL 3.9  4.6  4.0   Hemoglobin 12.0 - 15.0 g/dL 8.0  9.3  8.5   Hematocrit 36.0 - 46.0 % 25.1  30.0  26.8   Platelets 150 - 400 K/uL 123  174  132       Latest Ref Rng & Units 04/28/2022    4:55 AM 04/24/2022    5:18 AM 04/22/2022    4:57 AM  CMP  Glucose 70 - 99 mg/dL 129  154  149   BUN 6 - 20 mg/dL '21  20  20    '$ Creatinine 0.44 - 1.00 mg/dL 0.40  0.35  0.41   Sodium 135 - 145 mmol/L 133  129  132   Potassium 3.5 - 5.1 mmol/L 4.1  4.3  4.0   Chloride 98 - 111 mmol/L 102  100  102   CO2 22 - 32 mmol/L '26  25  24   '$ Calcium 8.9 - 10.3 mg/dL 8.2  7.9  8.2   Total Protein 6.5 - 8.1 g/dL 6.3  6.4    Total Bilirubin 0.3 - 1.2 mg/dL 0.4  0.5    Alkaline Phos 38 - 126 U/L 100  95    AST 15 - 41 U/L 22  25    ALT 0 - 44 U/L 15  17       Imaging studies:  No new imaging studies   Assessment/Plan: 56 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  - New 08/22: No new acute issues. Monitor H&H; slow down trend likely combination of dilution and chronic disease   - Wound Care: [name] pouch in place with red rubber present to  manage drainage. Anticipate fluid will pool in the wound bed while she is in bed. This has become semi-solid stool. It is okay to adjust the red rubber catheter from time to time to help with drainage while in bed but she remain in dependent portion of [name]. This can be removed from suction to ambulate and work with therapies. These pouches need changed every 7-10 days (ideally). Changed 08/21...Marland Kitchennext change 08/31 if no issues. Will attempt to see if there is similar [name] pouch without plastic window for next change. This window is creating further pressure on the wound at this point  - Trial FLD at patient request + nutritional supplementation - Continue TPN at goal; monitor electrolytes  - Monitor abdominal examination; on-going bowel function  - Pain control prn; antemetic prn  - Out of bed; discharged from therapies; no recommendations at this time   - Discharge Planning: Anticipate lengthy admission and potential  eventual transfer   All of the above findings and recommendations were discussed with the patient, and the medical team, and all of patient's questions were answered to her expressed satisfaction.  -- [name], PA-C [facility] 04/29/2022, 7:33 AM M-F: 7am - 4pm

## 2022-04-29 NOTE — Progress Notes (Signed)
PHARMACY - TOTAL PARENTERAL NUTRITION CONSULT NOTE   Indication: Prolonged ileus  Patient Measurements: Height: '4\' 11"'$  (094.7 cm) Weight: 93.7 kg (206 lb 9.1 oz) IBW/kg (Calculated) : 43.2 TPN AdjBW (KG): 55.2 Body mass index is 41.72 kg/m.  Assessment:  56 y.o. female s/p laparotomy, excision of greater omental mass, abdominal wall reconstruction with [name],appendectomy, and placement of Prevena vac.  Glucose / Insulin: No apparent history of diabetes. BG <180  10 units of SSI moderate required + 15 units insulin in TPN bag Electrolytes:  8/10: stable hyponatremia (129>133), Mag 1.8  Ca 8.2   alb 2.5 Renal: Scr ~0.35-0.4 Hepatic: No transaminitis. LFTs within normal limits. TG within normal limits Intake/Output: 08/20 0701 - 08/21 0700 In: 791.2 [P.O.:0; I.V.:700.9] Out: 1300 [Drains:650]  GI Imaging: GI Surgeries / Procedures: s/p laparotomy, excision of greater omental mass, abdominal wall reconstruction with [name], appendectomy, and placement of Prevena vac  Central access: 02/15/22 TPN start date: 02/15/22  Nutritional Goals: Goal TPN rate of 65 mL/hr (provides 106 g of protein and 1872 kcals per day)  RD Assessment:  Estimated Needs Total Energy Estimated Needs: 1800-2100kcal/day Total Protein Estimated Needs: 90-110g/d Total Fluid Estimated Needs: 1.4-1.6L/day  Current Nutrition:  CLD + nutritional supplements -trial of FLD 8/17 Thin liquid diet   Plan: Continue TPN at 82m/hr Nutritional Components Amino acids (using 15% Clinisol): 106 grams Dextrose: 265.2 grams Lipids (using 20% SMOFlipids): 54.6 grams kCal: 1872/24h  Electrolytes in TPN (standard): Na 585m/L (Na adjusted from 50 to 55 meq/L on 8/17), K 7027mL, Ca 5mE48m, Mg 10mE20m and Phos 25 mmol/L. Cl:Ac 1:1 Enteral multivitamin. Add trace elements to TPN Continue moderate SSI TIDM + CBG 4x/day (ACHS) + 15u insulin in TPN Monitor TPN labs on Mon/Thurs  AnderDarrick PennarmD Clinical Pharmacist 04/29/2022 10:42 AM

## 2022-04-29 NOTE — Progress Notes (Addendum)
Nutrition Follow-up  DOCUMENTATION CODES:   Obesity unspecified  INTERVENTION:   Continue TPN per pharmacy- provides 1872kcal/day and 106g/day protein   Ensure Enlive po TID, each supplement provides 350 kcal and 20 grams of protein.  Daily weights   NUTRITION DIAGNOSIS:   Increased nutrient needs related to wound healing, catabolic illness as evidenced by estimated needs.  GOAL:   Patient will meet greater than or equal to 90% of their needs -met with TPN   MONITOR:   PO intake, Supplement acceptance, Labs, Weight trends, Diet advancement, I & O's, TPN  ASSESSMENT:   56 y/o female with h/o hypothyroidism, COVID 19 (3/21), kidney stones and stage 3 colon cancer (s/p left hemicolectomy 5/21 and chemotherapy) who is admitted for new pelvic mass now s/p laparotomy 6/8 (with excision of pelvic mass from greater omentum, abdominal wall reconstruction with bilateral myocutaneous flaps and mesh, incisional hernia repair, appendectomy repair and VAC placement) complicated by bowel perforation s/p reopening of recent laparotomy 6/10 (with repair of small bowel perforation, excision of mesh, placement of two phasix mesh and VAC placement). Pathology returned as metastatic adenocarcinoma.   Pt tolerating TPN at goal rate. Refeed labs stable. Triglycerides wnl. Pt advanced to GI soft diet yesterday. Pt with improved oral intake; pt eating 100% of her previous full liquid diet. Pt is drinking some Ensure. Per chart, pt appears weight stable since admission. [name] pouch last changed 8/21; pouch with 1066m output.    Medications reviewed and include: lovenox, insulin, L-glutamine, synthroid, imodium, MVI, protonix, carafate  Labs reviewed: Na 133(L), K 4.1 wnl, BUN 21(H), creat 0.40(L), P 3.6 wnl, Mg 1.8 wnl- 8/21 Triglycerides- 54- 8/21 Wbc- 3.9(L), Hgb 8.0(L), Hct 25.1(L), MCV 79.4(L), MCH 25.3(L),  Cbgs- 166, 132 x 24 hrs  Diet Order:    Diet Order             DIET SOFT Room service  appropriate? Yes; Fluid consistency: Thin  Diet effective now                  EDUCATION NEEDS:   Not appropriate for education at this time  Skin:  Skin Assessment: Reviewed RN Assessment ( Midline wound: 15cm x 10cm x 6cm )  Last BM:  8/20  Height:   Ht Readings from Last 1 Encounters:  03/01/22 '4\' 11"'  (1.499 m)    Weight:   Wt Readings from Last 1 Encounters:  04/29/22 93.7 kg    Ideal Body Weight:  44.3 kg  BMI:  Body mass index is 41.72 kg/m.  Estimated Nutritional Needs:   Kcal:  1800-2100kcal/day  Protein:  90-110g/d  Fluid:  1.4-1.6L/day  CKoleen DistanceMS, RD, LDN Please refer to [facility] Drfor RD and/or RD on-call/weekend/after hours pager

## 2022-04-30 LAB — GLUCOSE, CAPILLARY
Glucose-Capillary: 140 mg/dL — ABNORMAL HIGH (ref 70–99)
Glucose-Capillary: 130 mg/dL — ABNORMAL HIGH (ref 70–99)
Glucose-Capillary: 140 mg/dL — ABNORMAL HIGH (ref 70–99)
Glucose-Capillary: 139 mg/dL — ABNORMAL HIGH (ref 70–99)

## 2022-04-30 MED ORDER — TRACE MINERALS CU-MN-SE-ZN 300-55-60-3000 MCG/ML IV SOLN
INTRAVENOUS | Status: AC
  Filled 2022-04-30 (×2): qty 707.2

## 2022-04-30 NOTE — Progress Notes (Signed)
PHARMACY - TOTAL PARENTERAL NUTRITION CONSULT NOTE   Indication: Prolonged ileus  Patient Measurements: Height: '4\' 11"'$  (149.9 cm) Weight: 93.1 kg (205 lb 4 oz) IBW/kg (Calculated) : 43.2 TPN AdjBW (KG): 55.2 Body mass index is 41.46 kg/m.  Assessment:  56 y.o. female s/p laparotomy, excision of greater omental mass, abdominal wall reconstruction with [name],appendectomy, and placement of Prevena vac.  Glucose / Insulin: No apparent history of diabetes. BG <180  9 units of SSI moderate required + 15 units insulin in TPN bag Electrolytes:  8/10: stable hyponatremia (129>133), Mag 1.8  Ca 8.2   alb 2.5 Renal: Scr ~0.35-0.4 Hepatic: No transaminitis. LFTs within normal limits. TG within normal limits Intake/Output: 08/20 0701 - 08/21 0700 In: 791.2 [P.O.:0; I.V.:700.9] Out: 1300 [Drains:650]  GI Imaging: GI Surgeries / Procedures: s/p laparotomy, excision of greater omental mass, abdominal wall reconstruction with [name], appendectomy, and placement of Prevena vac  Central access: 02/15/22 TPN start date: 02/15/22  Nutritional Goals: Goal TPN rate of 65 mL/hr (provides 106 g of protein and 1872 kcals per day)  RD Assessment:  Estimated Needs Total Energy Estimated Needs: 1800-2100kcal/day Total Protein Estimated Needs: 90-110g/d Total Fluid Estimated Needs: 1.4-1.6L/day  Current Nutrition:  CLD + nutritional supplements -trial of FLD 8/17 Thin liquid diet   Plan: Continue TPN at 68m/hr Nutritional Components Amino acids (using 15% Clinisol): 106 grams Dextrose: 265.2 grams Lipids (using 20% SMOFlipids): 54.6 grams kCal: 1872/24h  Electrolytes in TPN (standard): Na 575m/L (Na adjusted from 50 to 55 meq/L on 8/17), K 7016mL, Ca 5mE37m, Mg 10mE60m and Phos 25 mmol/L. Cl:Ac 1:1 Enteral multivitamin. Add trace elements to TPN Continue moderate SSI TIDM + CBG 4x/day (ACHS) + 15u insulin in TPN Monitor TPN labs on Mon/Thurs  AnderDarrick PennarmD Clinical Pharmacist 04/30/2022 10:39 AM

## 2022-04-30 NOTE — Progress Notes (Signed)
[place] Hospital Day(s): [place] op day(s): 74 Days Post-Op.   Interval History:  Patient seen and examined No issues overnight No abdominal pain; nausea, emesis, fevers No new labs [name] Pouch output 1600 ccs in last 24 hours She is on FLD; on TPN Ambulating independently   Vital signs in last 24 hours: [min-max] current  Temp:  [98 F (36.7 C)-98.8 F (37.1 C)] 98.1 F (36.7 C) (08/23 0318) Pulse Rate:  [71-93] 93 (08/22 2044) Resp:  [18-20] 20 (08/23 0318) BP: (102-120)/(50-83) 110/56 (08/23 0318) SpO2:  [93 %-99 %] 97 % (08/23 0318) Weight:  [93.1 kg] 93.1 kg (08/23 0318)     Height: '4\' 11"'$  (149.9 cm) Weight: 93.1 kg BMI (Calculated): 43.04   Intake/Output last 2 shifts:  08/22 0701 - 08/23 0700 In: 807.5 [P.O.:238; I.V.:569.5] Out: 2600 [Urine:1000; Drains:1600]   Physical Exam:  Constitutional: alert, cooperative and no distress  Respiratory: breathing non-labored at rest  Cardiovascular: regular rate and sinus rhythm  Gastrointestinal: Soft, abdominal soreness on the right, non-distended, no rebound/guarding. Integumentary: Midline wound open, peritoneum closed; granulating;  there are three stomatized areas visible in the LUQ and LLQ portions of the wound, output remains bilious;  Red rubbed in better position. [name] pouch without leak.  MSK: RUE edema improved; non-tender, peripheral pulses 2+   Labs:     Latest Ref Rng & Units 04/29/2022    5:26 AM 04/11/2022    1:27 PM 04/08/2022    8:52 AM  CBC  WBC 4.0 - 10.5 K/uL 3.9  4.6  4.0   Hemoglobin 12.0 - 15.0 g/dL 8.0  9.3  8.5   Hematocrit 36.0 - 46.0 % 25.1  30.0  26.8   Platelets 150 - 400 K/uL 123  174  132       Latest Ref Rng & Units 04/28/2022    4:55 AM 04/24/2022    5:18 AM 04/22/2022    4:57 AM  CMP  Glucose 70 - 99 mg/dL 129  154  149   BUN 6 - 20 mg/dL '21  20  20   '$ Creatinine 0.44 - 1.00 mg/dL 0.40  0.35  0.41   Sodium 135 - 145 mmol/L 133  129   132   Potassium 3.5 - 5.1 mmol/L 4.1  4.3  4.0   Chloride 98 - 111 mmol/L 102  100  102   CO2 22 - 32 mmol/L '26  25  24   '$ Calcium 8.9 - 10.3 mg/dL 8.2  7.9  8.2   Total Protein 6.5 - 8.1 g/dL 6.3  6.4    Total Bilirubin 0.3 - 1.2 mg/dL 0.4  0.5    Alkaline Phos 38 - 126 U/L 100  95    AST 15 - 41 U/L 22  25    ALT 0 - 44 U/L 15  17       Imaging studies:  No new imaging studies   Assessment/Plan: 56 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  - New 08/23: No new acute issues.   - Wound Care: [name] pouch in place with red rubber present to manage drainage. Anticipate fluid will pool in the wound bed while she is in bed. This has become semi-solid stool. It is okay  to adjust the red rubber catheter from time to time to help with drainage while in bed but she remain in dependent portion of [name]. This can be removed from suction to ambulate and work with therapies. These pouches need changed every 7-10 days (ideally). Changed 08/21...Marland Kitchennext change 08/31 if no issues. Will attempt to see if there is similar [name] pouch without plastic window for next change. This window is creating further pressure on the wound at this point  - Trial FLD at patient request + nutritional supplementation - Continue TPN at goal; monitor electrolytes  - Monitor abdominal examination; on-going bowel function  - Pain control prn; antemetic prn  - Out of bed; discharged from therapies; no recommendations at this time   - Discharge Planning: Anticipate lengthy admission and potential eventual transfer   All of the above findings and recommendations were discussed with the patient, and the medical team, and all of patient's questions were answered to  her expressed satisfaction.  -- [name], PA-C [facility] 04/30/2022, 7:21 AM M-F: 7am - 4pm

## 2022-04-30 NOTE — TOC Progression Note (Signed)
Transition of Care [facility]) - Progression Note    Patient Details  Name: Debra [name] MRN: [id number] Date of Birth: [birth year]  Transition of Care [facility]) CM/SW Contact  Beverly Sessions, RN Phone Number: 04/30/2022, 10:17 AM  Clinical Narrative:     Per Edwyna Ready PA next eakin pouch change 8/31 and patient still not medically ready for discharge   Expected Discharge Plan: [place] Barriers to Discharge: Continued Medical Work up  Expected Discharge Plan and Services Expected Discharge Plan: [place]                                               Social Determinants of Health (SDOH) Interventions    Readmission Risk Interventions     No data to display

## 2022-04-30 NOTE — Consult Note (Signed)
[place] Nurse fistula consult note Fistula type/location: midline wound with 2 stomatized fistulas left lateral wound bed and one fistula in the distal base of the wound bed Perifistula assessment: hypergranulation tissue present from 5-7 o'clock, constant pooling in this area and pattern cut a little wide still  Treatment options for perifistula skin: hydrofiber placed over hypergranulation tissue, covered with hydrocolloid, used strips of [name] to line wound edges  Output liquid stool with some formed items (she is eating now) Fistula pouching: large [name], to suction for 30 mins to gain seal, remove and placed to BSD to attempt to lessen trauma to the wound bed. Discussed with surgery team, off suction for a couple of days to see if she can be managed without continuous LWS.   Education provided: used Database administrator to explain changes to patient and rationale New [name] pattern left in room, taped to wall. 3 large [name] pouches ordered, 1 24 F red rubber catheter still in room if needed to return patient to [place] at the bedside to assist [place] nurse  [place] nursing team will follow along for support with fistula care and teaching  [place], [place], [place]

## 2022-04-30 NOTE — Progress Notes (Signed)
Mobility Specialist - Progress Note   04/30/22 1100  Mobility  Activity Ambulated independently in hallway  Level of Assistance Independent  Assistive Device None  Distance Ambulated (ft) 480 ft  Activity Response Tolerated well  $Mobility charge 1 Mobility    Merrily Brittle Mobility Specialist 04/30/22, 11:39 AM

## 2022-04-30 NOTE — Progress Notes (Signed)
Larena Glassman # [phone] spanish interpreter utilized. No question and concern at this tim. Will contineu to monitor.  Update [phone]: pt is complaining of a 6/10 right cheeks pain. MD Lysle Pearl made aware. Will continue to monitor.  Update 0617: No new order place. Will notify incoming shift. Will continue to monitor.

## 2022-05-01 LAB — MAGNESIUM: Magnesium: 1.9 mg/dL (ref 1.7–2.4)

## 2022-05-01 LAB — COMPREHENSIVE METABOLIC PANEL
ALT: 14 U/L (ref 0–44)
AST: 26 U/L (ref 15–41)
Albumin: 2.6 g/dL — ABNORMAL LOW (ref 3.5–5.0)
Alkaline Phosphatase: 113 U/L (ref 38–126)
Anion gap: 4 — ABNORMAL LOW (ref 5–15)
BUN: 20 mg/dL (ref 6–20)
CO2: 24 mmol/L (ref 22–32)
Calcium: 8.2 mg/dL — ABNORMAL LOW (ref 8.9–10.3)
Chloride: 105 mmol/L (ref 98–111)
Creatinine, Ser: 0.43 mg/dL — ABNORMAL LOW (ref 0.44–1.00)
GFR, Estimated: >60 mL/min (ref >60)
Glucose, Bld: 123 mg/dL — ABNORMAL HIGH (ref 70–99)
Potassium: 4.4 mmol/L (ref 3.5–5.1)
Sodium: 133 mmol/L — ABNORMAL LOW (ref 135–145)
Total Bilirubin: 0.3 mg/dL (ref 0.3–1.2)
Total Protein: 6.4 g/dL — ABNORMAL LOW (ref 6.5–8.1)

## 2022-05-01 LAB — CBC
HCT: 23 % — ABNORMAL LOW (ref 36.0–46.0)
Hemoglobin: 7.3 g/dL — ABNORMAL LOW (ref 12.0–15.0)
MCH: 24.7 pg — ABNORMAL LOW (ref 26.0–34.0)
MCHC: 31.7 g/dL (ref 30.0–36.0)
MCV: 77.7 fL — ABNORMAL LOW (ref 80.0–100.0)
Platelets: 116 10*3/uL — ABNORMAL LOW (ref 150–400)
RBC: 2.96 MIL/uL — ABNORMAL LOW (ref 3.87–5.11)
RDW: 16.4 % — ABNORMAL HIGH (ref 11.5–15.5)
WBC: 5.1 10*3/uL (ref 4.0–10.5)
nRBC: 0 % (ref 0.0–0.2)

## 2022-05-01 LAB — FERRITIN: Ferritin: 32 ng/mL (ref 11–307)

## 2022-05-01 LAB — IRON AND TIBC
Iron: 26 ug/dL — ABNORMAL LOW (ref 28–170)
Saturation Ratios: 8 % — ABNORMAL LOW (ref 10.4–31.8)
TIBC: 328 ug/dL (ref 250–450)
UIBC: 302 ug/dL

## 2022-05-01 LAB — GLUCOSE, CAPILLARY
Glucose-Capillary: 134 mg/dL — ABNORMAL HIGH (ref 70–99)
Glucose-Capillary: 145 mg/dL — ABNORMAL HIGH (ref 70–99)
Glucose-Capillary: 126 mg/dL — ABNORMAL HIGH (ref 70–99)
Glucose-Capillary: 127 mg/dL — ABNORMAL HIGH (ref 70–99)

## 2022-05-01 LAB — TRIGLYCERIDES: Triglycerides: 60 mg/dL (ref <150)

## 2022-05-01 LAB — PHOSPHORUS: Phosphorus: 3.7 mg/dL (ref 2.5–4.6)

## 2022-05-01 MED ORDER — TRACE MINERALS CU-MN-SE-ZN 300-55-60-3000 MCG/ML IV SOLN
INTRAVENOUS | Status: AC
  Filled 2022-05-01: qty 707.2

## 2022-05-01 NOTE — Progress Notes (Signed)
[facility] Day(s): [place] op day(s): 75 Days Post-Op.   Interval History:  Patient seen and examined No issues overnight No abdominal pain; nausea, emesis, fevers Remains without leukocytosis; WBC 5.1 Hgb now 7.3; iron level slightly low at 26 but TIBC and Ferritin levels normal Renal function stable; sCr - 0.43; UO - 1050 [name] Pouch output 420 ccs + unmeasured in last 24 hours She is on FLD; on TPN Ambulating independently   Vital signs in last 24 hours: [min-max] current  Temp:  [98.4 F (36.9 C)-98.8 F (37.1 C)] 98.4 F (36.9 C) (08/24 0728) Pulse Rate:  [74-95] 95 (08/24 0728) Resp:  [18-20] 18 (08/24 0728) BP: (92-109)/(59-66) 96/59 (08/24 0728) SpO2:  [96 %-98 %] 98 % (08/24 0728) Weight:  [93.4 kg] 93.4 kg (08/24 0412)     Height: '4\' 11"'$  (149.9 cm) Weight: 93.4 kg BMI (Calculated): 43.04   Intake/Output last 2 shifts:  08/23 0701 - 08/24 0700 In: 2609.3 [P.O.:460; I.V.:1549.3] Out: 9211 [Urine:1050; Drains:420]   Physical Exam:  Constitutional: alert, cooperative and no distress  Respiratory: breathing non-labored at rest  Cardiovascular: regular rate and sinus rhythm  Gastrointestinal: Soft, abdominal soreness on the right, non-distended, no rebound/guarding. Integumentary: Midline wound open, peritoneum closed; granulating;  there are three stomatized areas visible in the LUQ and LLQ portions of the wound, output remains bilious;  Red rubbed in better position. [name] pouch without leak.  MSK: RUE edema improved; non-tender, peripheral pulses 2+   Labs:     Latest Ref Rng & Units 05/01/2022    7:06 AM 04/29/2022    5:26 AM 04/11/2022    1:27 PM  CBC  WBC 4.0 - 10.5 K/uL 5.1  3.9  4.6   Hemoglobin 12.0 - 15.0 g/dL 7.3  8.0  9.3   Hematocrit 36.0 - 46.0 % 23.0  25.1  30.0   Platelets 150 - 400 K/uL 116  123  174       Latest Ref Rng & Units 05/01/2022    5:03 AM 04/28/2022    4:55 AM 04/24/2022    5:18  AM  CMP  Glucose 70 - 99 mg/dL 123  129  154   BUN 6 - 20 mg/dL '20  21  20   '$ Creatinine 0.44 - 1.00 mg/dL 0.43  0.40  0.35   Sodium 135 - 145 mmol/L 133  133  129   Potassium 3.5 - 5.1 mmol/L 4.4  4.1  4.3   Chloride 98 - 111 mmol/L 105  102  100   CO2 22 - 32 mmol/L '24  26  25   '$ Calcium 8.9 - 10.3 mg/dL 8.2  8.2  7.9   Total Protein 6.5 - 8.1 g/dL 6.4  6.3  6.4   Total Bilirubin 0.3 - 1.2 mg/dL 0.3  0.4  0.5   Alkaline Phos 38 - 126 U/L 113  100  95   AST 15 - 41 U/L '26  22  25   '$ ALT 0 - 44 U/L '14  15  17      '$ Imaging studies:  No new imaging studies   Assessment/Plan: 56 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  - New 08/24: Monitor H&H; may need transfusion   -  Wound Care: [name] pouch in place; now without red rubber to reduce pressure on wound. This is too foley bag with intermittent suction. There is some issues with pooling in the dependent portion of the wound/pouch. Continue to return to suction intermittently. These pouches need changed every 7-10 days (ideally). Changed 08/23; switch to gravity with intermittent suction; monitor   - Trial FLD at patient request + nutritional supplementation - Continue TPN at goal; monitor electrolytes  - Monitor abdominal examination; on-going bowel function  - Pain control prn; antemetic prn  - Out of bed; discharged from therapies; no recommendations at this time   - Discharge Planning: Anticipate lengthy admission and potential eventual transfer   All of the above findings and recommendations were discussed with the patient, and the medical team, and all of patient's questions were answered to her expressed satisfaction.  -- [name], PA-C [facility] 05/01/2022, 8:11 AM M-F: 7am - 4pm

## 2022-05-01 NOTE — Progress Notes (Addendum)
PHARMACY - TOTAL PARENTERAL NUTRITION CONSULT NOTE   Indication: Prolonged ileus  Patient Measurements: Height: '4\' 11"'$  (149.9 cm) Weight: 93.4 kg (205 lb 14.6 oz) IBW/kg (Calculated) : 43.2 TPN AdjBW (KG): 55.2 Body mass index is 41.59 kg/m.  Assessment:  56 y.o. female s/p laparotomy, excision of greater omental mass, abdominal wall reconstruction with [name],appendectomy, and placement of Prevena vac.  Glucose / Insulin: No apparent history of diabetes. BG <180  8 units of SSI moderate required + 15 units insulin in TPN bag Electrolytes:  8/24: stable hyponatremia (129>133), Mag 1.9  Ca 8.2   alb 2.6 Renal: Scr ~0.35-0.43 Hepatic: No transaminitis. LFTs within normal limits. TG within normal limits Intake/Output: 08/23 0701 - 08/24 0700 In: 2609.3 [P.O.:460; I.V.:1549.3] Out: 1470 [Drains:420]  GI Imaging: GI Surgeries / Procedures: s/p laparotomy, excision of greater omental mass, abdominal wall reconstruction with [name], appendectomy, and placement of Prevena vac  Central access: 02/15/22 TPN start date: 02/15/22  Nutritional Goals: Goal TPN rate of 65 mL/hr (provides 106 g of protein and 1872 kcals per day)  RD Assessment:  Estimated Needs Total Energy Estimated Needs: 1800-2100kcal/day Total Protein Estimated Needs: 90-110g/d Total Fluid Estimated Needs: 1.4-1.6L/day  Current Nutrition:  CLD + nutritional supplements -trial of FLD 8/17 Thin liquid diet   Plan: Continue TPN at 44m/hr Nutritional Components Amino acids (using 15% Clinisol): 106 grams Dextrose: 265.2 grams Lipids (using 20% SMOFlipids): 54.6 grams kCal: 1872/24h  Electrolytes in TPN (standard): Na 542m/L (Na adjusted from 50 to 55 meq/L on 8/17), K 7025mL, Ca 5mE60m, Mg 10mE76m and Phos 25 mmol/L. Cl:Ac 1:1 Enteral multivitamin. Add trace elements to TPN Continue moderate SSI TIDM + CBG 4x/day (ACHS) + 15u insulin in TPN Monitor TPN labs on  Mon/Thurs  AnderDarrick PennarmD Clinical Pharmacist 05/01/2022 9:52 AM

## 2022-05-01 NOTE — Consult Note (Signed)
[name] pouch to gravity drainage, intermittent suction per staff for pooling PRN through access window. Attempting to wean off suction. Patient up ambulating. Will follow along Next pouch change sometime next week based on status of pouch/integrity of skin barrier. May need more frequent pouch changes without continuous low wall suction  [employer], CNS, CWON-AP [phone]

## 2022-05-02 LAB — GLUCOSE, CAPILLARY
Glucose-Capillary: 124 mg/dL — ABNORMAL HIGH (ref 70–99)
Glucose-Capillary: 116 mg/dL — ABNORMAL HIGH (ref 70–99)
Glucose-Capillary: 128 mg/dL — ABNORMAL HIGH (ref 70–99)
Glucose-Capillary: 113 mg/dL — ABNORMAL HIGH (ref 70–99)

## 2022-05-02 MED ORDER — TRACE MINERALS CU-MN-SE-ZN 300-55-60-3000 MCG/ML IV SOLN
INTRAVENOUS | Status: AC
  Filled 2022-05-02: qty 707.2

## 2022-05-02 MED ORDER — NAPROXEN 500 MG PO TABS
500.0000 mg | ORAL_TABLET | Freq: Two times a day (BID) | ORAL | Status: DC | PRN
  Filled 2022-05-02: qty 1

## 2022-05-02 NOTE — Progress Notes (Signed)
PHARMACY - TOTAL PARENTERAL NUTRITION CONSULT NOTE   Indication: Prolonged ileus  Patient Measurements: Height: '4\' 11"'$  (149.9 cm) Weight: 93.4 kg (205 lb 14.6 oz) IBW/kg (Calculated) : 43.2 TPN AdjBW (KG): 55.2 Body mass index is 41.59 kg/m.  Assessment:  56 y.o. female s/p laparotomy, excision of greater omental mass, abdominal wall reconstruction with [name],appendectomy, and placement of Prevena vac.  Glucose / Insulin: No apparent history of diabetes. BG <180  8 units of SSI moderate required + 15 units insulin in TPN bag Electrolytes:  8/24: stable hyponatremia (129>133), Mag 1.9  Ca 8.2   alb 2.6 Renal: Scr ~0.35-0.43 Hepatic: No transaminitis. LFTs within normal limits. TG within normal limits Intake/Output: 08/23 0701 - 08/24 0700 In: 1519.3 [P.O.:220; I.V.:1299.3] Out: -130.7 [Drains: 650]  GI Imaging: GI Surgeries / Procedures: s/p laparotomy, excision of greater omental mass, abdominal wall reconstruction with [name], appendectomy, and placement of Prevena vac  Central access: 02/15/22 TPN start date: 02/15/22  Nutritional Goals: Goal TPN rate of 65 mL/hr (provides 106 g of protein and 1872 kcals per day)  RD Assessment:  Estimated Needs Total Energy Estimated Needs: 1800-2100kcal/day Total Protein Estimated Needs: 90-110g/d Total Fluid Estimated Needs: 1.4-1.6L/day  Current Nutrition:  CLD + nutritional supplements -trial of FLD 8/17 Thin liquid diet   Plan: Continue TPN at 28m/hr Nutritional Components Amino acids (using 15% Clinisol): 106 grams Dextrose: 265.2 grams Lipids (using 20% SMOFlipids): 54.6 grams kCal: 1872/24h  Electrolytes in TPN (standard): Na 528m/L (Na adjusted from 50 to 55 meq/L on 8/17), K 7033mL, Ca 5mE59m, Mg 10mE77m and Phos 25 mmol/L. Cl:Ac 1:1 Enteral multivitamin. Add trace elements to TPN Continue moderate SSI TIDM + CBG 4x/day (ACHS) + 15u insulin in TPN Monitor TPN labs on  Mon/Thurs  AnderDarrick PennarmD Clinical Pharmacist 05/02/2022 10:06 AM

## 2022-05-02 NOTE — Progress Notes (Signed)
Bedside RN noted leakage at base of [name] pouch. [name] pouch removed. Strips of [name] used to protect perifistula skin. Large [name] used placed to BSD.

## 2022-05-02 NOTE — Progress Notes (Signed)
EC fistula 600 cc yesterday AVSS Change [name] continuous suction for PRN and gravity , this has worked better [employer] but not enough to meet demands still in need for TPN  PE NAD [name] pouch: three stomatized areas visible, good seal. Working well while off suction   A/P Doing well She currently is the best she has been in a while Continue mobilization and current supportive care

## 2022-05-02 NOTE — Progress Notes (Signed)
End of shift note: 0700-1900  A new [name] pouch was placed today since the one that was on pt's abdominal site was leaking. This afternoon pt had one episode of emesis and IV Zofran was given. Pt continues on TPN . Pt's port needle needs to be changed tomorrow. VSS. BG was checked and coverage was given.

## 2022-05-03 LAB — GLUCOSE, CAPILLARY
Glucose-Capillary: 153 mg/dL — ABNORMAL HIGH (ref 70–99)
Glucose-Capillary: 125 mg/dL — ABNORMAL HIGH (ref 70–99)
Glucose-Capillary: 128 mg/dL — ABNORMAL HIGH (ref 70–99)
Glucose-Capillary: 107 mg/dL — ABNORMAL HIGH (ref 70–99)

## 2022-05-03 MED ORDER — TRACE MINERALS CU-MN-SE-ZN 300-55-60-3000 MCG/ML IV SOLN
INTRAVENOUS | Status: AC
  Filled 2022-05-03: qty 707.2

## 2022-05-03 NOTE — Progress Notes (Addendum)
Patient's open abdominal wound/pouch intermittently manually suctioned at least every 3 hours and/or PRN in between.  Approximately 300 ml output for day shift today.  [name] pouch/wound manager remains intact and dry.  No further leakage noted at this time.

## 2022-05-03 NOTE — Progress Notes (Signed)
PHARMACY - TOTAL PARENTERAL NUTRITION CONSULT NOTE   Indication: Prolonged ileus  Patient Measurements: Height: '4\' 11"'$  (149.9 cm) Weight: 93.5 kg (206 lb 2.1 oz) IBW/kg (Calculated) : 43.2 TPN AdjBW (KG): 55.2 Body mass index is 41.63 kg/m.  Assessment:  56 y.o. female s/p laparotomy, excision of greater omental mass, abdominal wall reconstruction with Debra [name] release,appendectomy, and placement of Prevena vac.  Glucose / Insulin: No apparent history of diabetes. BG <180  3 units of SSI moderate required + 15 units insulin in TPN bag Electrolytes:  8/24: stable hyponatremia (129>133), Mag 1.9  Ca 8.2   alb 2.6 Renal: Scr ~0.35-0.43 Hepatic: No transaminitis. LFTs within normal limits. TG within normal limits Intake/Output: 08/23 0701 - 08/24 0700 In: 1519.3 [P.O.:220; I.V.:1299.3] Out: -130.7 [Drains: 650]  GI Imaging: GI Surgeries / Procedures: s/p laparotomy, excision of greater omental mass, abdominal wall reconstruction with Debra [name] release, appendectomy, and placement of Prevena vac  Central access: 02/15/22 TPN start date: 02/15/22  Nutritional Goals: Goal TPN rate of 65 mL/hr (provides 106 g of protein and 1872 kcals per day)  RD Assessment:  Estimated Needs Total Energy Estimated Needs: 1800-2100kcal/day Total Protein Estimated Needs: 90-110g/d Total Fluid Estimated Needs: 1.4-1.6L/day  Current Nutrition:  CLD + nutritional supplements -trial of FLD 8/17 Thin liquid diet   Plan: Continue TPN at 53m/hr Nutritional Components Amino acids (using 15% Clinisol): 106 grams Dextrose: 265.2 grams Lipids (using 20% SMOFlipids): 54.6 grams kCal: 1872/24h  Electrolytes in TPN (standard): Na 574m/L (Na adjusted from 50 to 55 meq/L on 8/17), K 7092mL, Ca 5mE43m, Mg 10mE59m and Phos 25 mmol/L. Cl:Ac 1:1 Enteral multivitamin. Add trace elements to TPN Continue moderate SSI TIDM + CBG 4x/day (ACHS) + 15u insulin in TPN Monitor TPN labs on  Mon/Thurs  KishaOswald HillockrmD Clinical Pharmacist 05/03/2022 9:22 AM

## 2022-05-03 NOTE — Progress Notes (Signed)
Received patient with leaking [name] Gaffer.  Leak at the whole length of the bottom edge of pouch dressing, previously reinforced by night RN but leak persisted.  Removed old reinforcement dressing, cleansed and prepped skin.  Applied thin duoderm along the line of the bottom dressing, reinforced with tegaderm transparent dressing.  Will closely monitor.

## 2022-05-03 NOTE — Progress Notes (Signed)
Patient ID: Debra [name], female   DOB: 02/28/1966, 56 y.o.   MRN: [id number]     [place] Day(s): 61.   Interval History: Patient seen and examined, no acute events or new complaints overnight. Patient reports feeling well.  She denies any significant abdominal pain.  She denies any nausea or vomiting.  Vital signs in last 24 hours: [min-max] current  Temp:  [98.1 F (36.7 C)-98.8 F (37.1 C)] 98.4 F (36.9 C) (08/26 0814) Pulse Rate:  [69-81] 81 (08/26 0814) Resp:  [16-18] 18 (08/26 0814) BP: (103-129)/(56-83) 111/59 (08/26 0814) SpO2:  [97 %-100 %] 99 % (08/26 0814) Weight:  [93.5 kg] 93.5 kg (08/26 0500)     Height: '4\' 11"'$  (149.9 cm) Weight: 93.5 kg BMI (Calculated): 43.04   Physical Exam:  Constitutional: alert, cooperative and no distress  Respiratory: breathing non-labored at rest  Cardiovascular: regular rate and sinus rhythm  Gastrointestinal: soft, non-tender, and non-distended. [name] weight adequate seal.  Labs:     Latest Ref Rng & Units 05/01/2022    7:06 AM 04/29/2022    5:26 AM 04/11/2022    1:27 PM  CBC  WBC 4.0 - 10.5 K/uL 5.1  3.9  4.6   Hemoglobin 12.0 - 15.0 g/dL 7.3  8.0  9.3   Hematocrit 36.0 - 46.0 % 23.0  25.1  30.0   Platelets 150 - 400 K/uL 116  123  174       Latest Ref Rng & Units 05/01/2022    5:03 AM 04/28/2022    4:55 AM 04/24/2022    5:18 AM  CMP  Glucose 70 - 99 mg/dL 123  129  154   BUN 6 - 20 mg/dL '20  21  20   '$ Creatinine 0.44 - 1.00 mg/dL 0.43  0.40  0.35   Sodium 135 - 145 mmol/L 133  133  129   Potassium 3.5 - 5.1 mmol/L 4.4  4.1  4.3   Chloride 98 - 111 mmol/L 105  102  100   CO2 22 - 32 mmol/L '24  26  25   '$ Calcium 8.9 - 10.3 mg/dL 8.2  8.2  7.9   Total Protein 6.5 - 8.1 g/dL 6.4  6.3  6.4   Total Bilirubin 0.3 - 1.2 mg/dL 0.3  0.4  0.5   Alkaline Phos 38 - 126 U/L 113  100  95   AST 15 - 41 U/L '26  22  25   '$ ALT 0 - 44 U/L '14  15  17     '$ Imaging studies: No new pertinent imaging  studies   Assessment/Plan:  56 y.o. female with metastatic colon cancer s/p omentectomy and incisional hernia repair 23 Days Post-Op, complicated by pertinent comorbidities including enterocutaneous fistula.  -No clinical deterioration.  Patient continue doing well.  No fever.  Adequate vital signs. -Still with high output fistula.  Patient need aggressive nutrition with oral and parenteral infusion. -Continue pain management -Continue DVT prophylaxis -Continue ambulation    [name], MD

## 2022-05-04 LAB — GLUCOSE, CAPILLARY
Glucose-Capillary: 147 mg/dL — ABNORMAL HIGH (ref 70–99)
Glucose-Capillary: 141 mg/dL — ABNORMAL HIGH (ref 70–99)
Glucose-Capillary: 114 mg/dL — ABNORMAL HIGH (ref 70–99)
Glucose-Capillary: 140 mg/dL — ABNORMAL HIGH (ref 70–99)

## 2022-05-04 MED ORDER — TRACE MINERALS CU-MN-SE-ZN 300-55-60-3000 MCG/ML IV SOLN
INTRAVENOUS | Status: AC
  Filled 2022-05-04: qty 707.2

## 2022-05-04 NOTE — Progress Notes (Signed)
PHARMACY - TOTAL PARENTERAL NUTRITION CONSULT NOTE   Indication: Prolonged ileus  Patient Measurements: Height: '4\' 11"'$  (149.9 cm) Weight: 92.6 kg (204 lb 2.3 oz) IBW/kg (Calculated) : 43.2 TPN AdjBW (KG): 55.2 Body mass index is 41.23 kg/m.  Assessment:  56 y.o. female s/p laparotomy, excision of greater omental mass, abdominal wall reconstruction with [name],appendectomy, and placement of Prevena vac.  Glucose / Insulin: No apparent history of diabetes. BG <180  7 units of SSI moderate required + 15 units insulin in TPN bag Electrolytes:  8/24: stable hyponatremia (129>133), Mag 1.9  Ca 8.2   alb 2.6 Renal: Scr ~0.35-0.43 Hepatic: No transaminitis. LFTs within normal limits. TG within normal limits Intake/Output: 08/23 0701 - 08/24 0700 In: 1519.3 [P.O.:220; I.V.:1299.3] Out: -130.7 [Drains: 650]  GI Imaging: GI Surgeries / Procedures: s/p laparotomy, excision of greater omental mass, abdominal wall reconstruction with [name], appendectomy, and placement of Prevena vac  Central access: 02/15/22 TPN start date: 02/15/22  Nutritional Goals: Goal TPN rate of 65 mL/hr (provides 106 g of protein and 1872 kcals per day)  RD Assessment:  Estimated Needs Total Energy Estimated Needs: 1800-2100kcal/day Total Protein Estimated Needs: 90-110g/d Total Fluid Estimated Needs: 1.4-1.6L/day  Current Nutrition:  CLD + nutritional supplements -trial of FLD 8/17 Thin liquid diet   Plan: Continue TPN at 4m/hr Nutritional Components Amino acids (using 15% Clinisol): 106 grams Dextrose: 265.2 grams Lipids (using 20% SMOFlipids): 54.6 grams kCal: 1872/24h  Electrolytes in TPN (standard): Na 544m/L (Na adjusted from 50 to 55 meq/L on 8/17), K 7080mL, Ca 5mE48m, Mg 10mE26m and Phos 25 mmol/L. Cl:Ac 1:1 Enteral multivitamin. Add trace elements to TPN Continue moderate SSI TIDM + CBG 4x/day (ACHS) + 15u insulin in TPN Monitor TPN labs on  Mon/Thurs  KishaOswald HillockrmD Clinical Pharmacist 05/04/2022 8:41 AM

## 2022-05-04 NOTE — Progress Notes (Signed)
Patient ID: Debra [name], female   DOB: 07/31/1966, 56 y.o.   MRN: [id number]     [place] [facility] Day(s): 80.   Interval History: Patient seen and examined, no acute events or new complaints overnight. Patient reports the patient was found sleeping comfortable.  She was arousable.  She denies any complaint.  She is tolerating diet.  She denies any nausea or vomiting.  She denies any issues with the [name] pouch  Vital signs in last 24 hours: [min-max] current  Temp:  [98.2 F (36.8 C)-98.5 F (36.9 C)] 98.2 F (36.8 C) (08/27 0814) Pulse Rate:  [70-84] 77 (08/27 0814) Resp:  [16-18] 18 (08/27 0814) BP: (100-106)/(52-61) 106/55 (08/27 0814) SpO2:  [96 %-100 %] 100 % (08/27 0814) Weight:  [92.6 kg] 92.6 kg (08/27 0500)     Height: '4\' 11"'$  (149.9 cm) Weight: 92.6 kg BMI (Calculated): 43.04   Physical Exam:  Constitutional: alert, cooperative and no distress  Respiratory: breathing non-labored at rest  Cardiovascular: regular rate and sinus rhythm  Gastrointestinal: soft, non-tender, and non-distended.  [name] pouch in place.  Adequate seal.  Labs:     Latest Ref Rng & Units 05/01/2022    7:06 AM 04/29/2022    5:26 AM 04/11/2022    1:27 PM  CBC  WBC 4.0 - 10.5 K/uL 5.1  3.9  4.6   Hemoglobin 12.0 - 15.0 g/dL 7.3  8.0  9.3   Hematocrit 36.0 - 46.0 % 23.0  25.1  30.0   Platelets 150 - 400 K/uL 116  123  174       Latest Ref Rng & Units 05/01/2022    5:03 AM 04/28/2022    4:55 AM 04/24/2022    5:18 AM  CMP  Glucose 70 - 99 mg/dL 123  129  154   BUN 6 - 20 mg/dL '20  21  20   '$ Creatinine 0.44 - 1.00 mg/dL 0.43  0.40  0.35   Sodium 135 - 145 mmol/L 133  133  129   Potassium 3.5 - 5.1 mmol/L 4.4  4.1  4.3   Chloride 98 - 111 mmol/L 105  102  100   CO2 22 - 32 mmol/L '24  26  25   '$ Calcium 8.9 - 10.3 mg/dL 8.2  8.2  7.9   Total Protein 6.5 - 8.1 g/dL 6.4  6.3  6.4   Total Bilirubin 0.3 - 1.2 mg/dL 0.3  0.4  0.5   Alkaline Phos 38 - 126 U/L 113  100  95   AST 15 -  41 U/L '26  22  25   '$ ALT 0 - 44 U/L '14  15  17     '$ Imaging studies: No new pertinent imaging studies   Assessment/Plan:  56 y.o. female with metastatic colon cancer s/p omentectomy and incisional hernia repair 41 Days Post-Op, complicated by pertinent comorbidities including enterocutaneous fistula.  Patient recovering slowly but adequately.  There has been no clinical deterioration. Adequate vital signs, no fever. Patient tolerating diet.  Still needing combination of enteral and parenteral nutrition. I encouraged the patient to ambulate.  We will continue with DVT prophylaxis  [name], MD

## 2022-05-05 LAB — COMPREHENSIVE METABOLIC PANEL
ALT: 15 U/L (ref 0–44)
AST: 23 U/L (ref 15–41)
Albumin: 2.6 g/dL — ABNORMAL LOW (ref 3.5–5.0)
Alkaline Phosphatase: 102 U/L (ref 38–126)
Anion gap: 5 (ref 5–15)
BUN: 21 mg/dL — ABNORMAL HIGH (ref 6–20)
CO2: 25 mmol/L (ref 22–32)
Calcium: 8.1 mg/dL — ABNORMAL LOW (ref 8.9–10.3)
Chloride: 104 mmol/L (ref 98–111)
Creatinine, Ser: 0.44 mg/dL (ref 0.44–1.00)
GFR, Estimated: >60 mL/min (ref >60)
Glucose, Bld: 123 mg/dL — ABNORMAL HIGH (ref 70–99)
Potassium: 4.1 mmol/L (ref 3.5–5.1)
Sodium: 134 mmol/L — ABNORMAL LOW (ref 135–145)
Total Bilirubin: 0.4 mg/dL (ref 0.3–1.2)
Total Protein: 6.4 g/dL — ABNORMAL LOW (ref 6.5–8.1)

## 2022-05-05 LAB — TRIGLYCERIDES: Triglycerides: 60 mg/dL (ref <150)

## 2022-05-05 LAB — MAGNESIUM: Magnesium: 1.8 mg/dL (ref 1.7–2.4)

## 2022-05-05 LAB — GLUCOSE, CAPILLARY
Glucose-Capillary: 128 mg/dL — ABNORMAL HIGH (ref 70–99)
Glucose-Capillary: 124 mg/dL — ABNORMAL HIGH (ref 70–99)
Glucose-Capillary: 118 mg/dL — ABNORMAL HIGH (ref 70–99)
Glucose-Capillary: 153 mg/dL — ABNORMAL HIGH (ref 70–99)

## 2022-05-05 LAB — PHOSPHORUS: Phosphorus: 3.8 mg/dL (ref 2.5–4.6)

## 2022-05-05 MED ORDER — TRACE MINERALS CU-MN-SE-ZN 300-55-60-3000 MCG/ML IV SOLN
INTRAVENOUS | Status: AC
  Filled 2022-05-05: qty 707.2

## 2022-05-05 NOTE — Progress Notes (Signed)
CC: EC fistula Subjective: 1050 output Tolerating diet [name] pouch to gravity and intermittent suction  Objective: Vital signs in last 24 hours: Temp:  [98.3 F (36.8 C)-98.9 F (37.2 C)] 98.7 F (37.1 C) (08/28 0800) Pulse Rate:  [71-80] 71 (08/28 0800) Resp:  [16-20] 16 (08/28 0800) BP: (95-110)/(58-68) 95/61 (08/28 0800) SpO2:  [97 %-100 %] 97 % (08/28 0800) Weight:  [93.3 kg] 93.3 kg (08/28 0500) Last BM Date : 05/04/22  Intake/Output from previous day: 08/27 0701 - 08/28 0700 In: 2246.3 [P.O.:240; I.V.:2006.3] Out: 1250 [Urine:200; Drains:1050] Intake/Output this shift: No intake/output data recorded.  Physical exam:  NAd eakin pouch in place  Lab Results: CBC  No results for input(s): "WBC", "HGB", "HCT", "PLT" in the last 72 hours. BMET Recent Labs    05/05/22 0543  NA 134*  K 4.1  CL 104  CO2 25  GLUCOSE 123*  BUN 21*  CREATININE 0.44  CALCIUM 8.1*   PT/INR No results for input(s): "LABPROT", "INR" in the last 72 hours. ABG No results for input(s): "PHART", "HCO3" in the last 72 hours.  Invalid input(s): "PCO2", "PO2"  Studies/Results: No results found.  Anti-infectives: Anti-infectives (From admission, onward)    Start     Dose/Rate Route Frequency Ordered Stop   03/04/22 2200  piperacillin-tazobactam (ZOSYN) IVPB 3.375 g        3.375 g 12.5 mL/hr over 240 Minutes Intravenous Every 8 hours 03/04/22 2005 03/18/22 0248   02/21/22 1500  vancomycin (VANCOREADY) IVPB 1250 mg/250 mL  Status:  Discontinued        1,250 mg 166.7 mL/hr over 90 Minutes Intravenous Every 24 hours 02/21/22 1111 02/24/22 1336   02/21/22 1400  meropenem (MERREM) 1 g in sodium chloride 0.9 % 100 mL IVPB        1 g 200 mL/hr over 30 Minutes Intravenous Every 8 hours 02/21/22 1258 02/27/22 2157   02/20/22 1500  vancomycin (VANCOCIN) IVPB 1000 mg/200 mL premix  Status:  Discontinued        1,000 mg 200 mL/hr over 60 Minutes Intravenous Every 24 hours 02/19/22 1706  02/21/22 1111   02/19/22 1115  vancomycin (VANCOREADY) IVPB 2000 mg/400 mL        2,000 mg 200 mL/hr over 120 Minutes Intravenous  Once 02/19/22 1025 02/19/22 1732   02/19/22 1115  fluconazole (DIFLUCAN) IVPB 400 mg        400 mg 100 mL/hr over 120 Minutes Intravenous Every 24 hours 02/19/22 1025 03/04/22 1301   02/15/22 0730  piperacillin-tazobactam (ZOSYN) IVPB 3.375 g  Status:  Discontinued        3.375 g 12.5 mL/hr over 240 Minutes Intravenous Every 8 hours 02/15/22 0639 02/21/22 1235   02/15/22 0655  piperacillin-tazobactam (ZOSYN) 3.375 GM/50ML IVPB       Note to Pharmacy: Glenford Peers N: cabinet override      02/15/22 0655 02/15/22 0816   02/13/22 2200  cefoTEtan (CEFOTAN) 2 g in sodium chloride 0.9 % 100 mL IVPB  Status:  Discontinued        2 g 200 mL/hr over 30 Minutes Intravenous Every 12 hours 02/13/22 1541 02/15/22 0639   02/13/22 1345  cefoTEtan (CEFOTAN) 2 g in sodium chloride 0.9 % 100 mL IVPB        2 g 200 mL/hr over 30 Minutes Intravenous  Once 02/13/22 1331 02/13/22 2146   02/13/22 1336  sodium chloride 0.9 % with cefoTEtan (CEFOTAN) ADS Med       Note to  Pharmacy: Rulon Sera: cabinet override      02/13/22 1336 02/14/22 0144   02/13/22 0620  sodium chloride 0.9 % with cefoTEtan (CEFOTAN) ADS Med       Note to Pharmacy: Norton Blizzard A: cabinet override      02/13/22 0620 02/13/22 0802   02/13/22 0600  cefoTEtan (CEFOTAN) 2 g in sodium chloride 0.9 % 100 mL IVPB        2 g 200 mL/hr over 30 Minutes Intravenous On call to O.R. 02/12/22 2229 02/13/22 2146       Assessment/Plan: Continue eakin pouch to gravity and prn suction. May need to change more frequently Continue tpn for now    Caroleen Hamman, MD, [facility]  05/05/2022

## 2022-05-05 NOTE — Progress Notes (Signed)
PHARMACY - TOTAL PARENTERAL NUTRITION CONSULT NOTE   Indication: Prolonged ileus  Patient Measurements: Height: '4\' 11"'$  (149.9 cm) Weight: 93.3 kg (205 lb 11 oz) IBW/kg (Calculated) : 43.2 TPN AdjBW (KG): 55.2 Body mass index is 41.54 kg/m.  Assessment:  56 y.o. female s/p laparotomy, excision of greater omental mass, abdominal wall reconstruction with [name],appendectomy, and placement of Prevena vac.  Glucose / Insulin: No apparent history of diabetes. BG < 180 6 units of SSI moderate required + 15 units insulin in TPN bag Electrolytes:  8/24: stable hyponatremia (129>133), Mag 1.9  Ca 8.2   alb 2.6 Renal: Scr ~0.35-0.43 Hepatic: No transaminitis. LFTs within normal limits. TG within normal limits Intake/Output: 08/27 0701 - 08/28 0700 In: +2,245 [P.O.:220; I.V.:1299.3] Out: -1250 [Drains: 1,050]  GI Imaging: GI Surgeries / Procedures: s/p laparotomy, excision of greater omental mass, abdominal wall reconstruction with [name], appendectomy, and placement of Prevena vac  Central access: 02/15/22 TPN start date: 02/15/22  Nutritional Goals: Goal TPN rate of 65 mL/hr (provides 106 g of protein and 1872 kcals per day)  RD Assessment:  Estimated Needs Total Energy Estimated Needs: 1800-2100kcal/day Total Protein Estimated Needs: 90-110g/d Total Fluid Estimated Needs: 1.4-1.6L/day  Current Nutrition:  CLD + nutritional supplements -trial of FLD 8/17 Thin liquid diet   Plan: Continue TPN at 21m/hr Nutritional Components Amino acids (using 15% Clinisol): 106 grams Dextrose: 265.2 grams Lipids (using 20% SMOFlipids): 54.6 grams kCal: 1872/24h  Electrolytes in TPN (standard): Na 526m/L (Na adjusted from 50 to 55 meq/L on 8/17), K 7059mL, Ca 5mE69m, Mg 10mE63m and Phos 25 mmol/L. Cl:Ac 1:1 Enteral multivitamin. Add trace elements to TPN Continue moderate SSI TIDM + CBG 4x/day (ACHS) + 15u insulin in TPN Monitor TPN labs on  Mon/Thurs   CarolGlean SalvormD Clinical Pharmacist  05/05/2022 9:58 AM

## 2022-05-05 NOTE — Plan of Care (Signed)
Pt AAOx4, no pain. VS are stable. Plan for ambulation and to monitor I&O. Bed in lowest position, call light within reach. Will continue to monitor.

## 2022-05-05 NOTE — TOC Progression Note (Signed)
Transition of Care [facility]) - Progression Note    Patient Details  Name: Debra [name] MRN: [id number] Date of Birth: 10-28-1965  [facility]) CM/SW [place], LCSW Phone Number: 05/05/2022, 9:43 AM  Clinical Narrative:  TOC continues to follow for disposition needs.   Expected Discharge Plan: [place] Barriers to Discharge: Continued Medical Work up  Expected Discharge Plan and Services Expected Discharge Plan: [place]                                               Social Determinants of Health (SDOH) Interventions    Readmission Risk Interventions     No data to display

## 2022-05-06 LAB — GLUCOSE, CAPILLARY
Glucose-Capillary: 159 mg/dL — ABNORMAL HIGH (ref 70–99)
Glucose-Capillary: 131 mg/dL — ABNORMAL HIGH (ref 70–99)
Glucose-Capillary: 103 mg/dL — ABNORMAL HIGH (ref 70–99)
Glucose-Capillary: 118 mg/dL — ABNORMAL HIGH (ref 70–99)

## 2022-05-06 MED ORDER — TRACE MINERALS CU-MN-SE-ZN 300-55-60-3000 MCG/ML IV SOLN
INTRAVENOUS | Status: AC
  Filled 2022-05-06: qty 707.2

## 2022-05-06 MED ORDER — MAGNESIUM SULFATE 2 GM/50ML IV SOLN
2.0000 g | Freq: Once | INTRAVENOUS | Status: AC
  Administered 2022-05-06: 2 g via INTRAVENOUS
  Filled 2022-05-06: qty 50

## 2022-05-06 NOTE — Progress Notes (Signed)
Nutrition Follow-up  DOCUMENTATION CODES:   Obesity unspecified  INTERVENTION:   Continue TPN per pharmacy- provides 1872kcal/day and 106g/day protein   Ensure Enlive po TID, each supplement provides 350 kcal and 20 grams of protein.  Daily weights   NUTRITION DIAGNOSIS:   Increased nutrient needs related to wound healing, catabolic illness as evidenced by estimated needs.  GOAL:   Patient will meet greater than or equal to 90% of their needs -met with TPN   MONITOR:   PO intake, Supplement acceptance, Labs, Weight trends, Diet advancement, I & O's, TPN  ASSESSMENT:   56 y/o female with h/o hypothyroidism, COVID 19 (3/21), kidney stones and stage 3 colon cancer (s/p left hemicolectomy 5/21 and chemotherapy) who is admitted for new pelvic mass now s/p laparotomy 6/8 (with excision of pelvic mass from greater omentum, abdominal wall reconstruction with bilateral myocutaneous flaps and mesh, incisional hernia repair, appendectomy repair and VAC placement) complicated by bowel perforation s/p reopening of recent laparotomy 6/10 (with repair of small bowel perforation, excision of mesh, placement of two phasix mesh and VAC placement). Pathology returned as metastatic adenocarcinoma.   Pt tolerating TPN at goal rate. Refeed labs stable. Triglycerides wnl. Hyperglycemia improved with insulin in TPN. Pt remains on a soft diet and seems to have improved oral intake. Pt is also drinking some Ensure. Per chart, pt appears weight stable since admission. [name] pouch last changed 8/25; pouch with 676m output.    Medications reviewed and include: lovenox, insulin, L-glutamine, synthroid, imodium, MVI, protonix, carafate, Mg sulfate  Labs reviewed: Na 134(L), K 4.1 wnl, BUN 21(H), P 3.8 wnl, Mg 1.8 wnl- 8/8 Triglycerides- 60- 8/28 Iron 26(L), TIBC 328, ferritin 32- 8/24 Hgb 7.3(L), Hct 23.0(L), MCV 77.7(L), MCH 24.7(L) Cbgs-  131, 159 x 24 hrs  Diet Order:    Diet Order              DIET SOFT Room service appropriate? Yes; Fluid consistency: Thin  Diet effective now                  EDUCATION NEEDS:   Not appropriate for education at this time  Skin:  Skin Assessment: Reviewed RN Assessment ( Midline wound: 15cm x 10cm x 6cm )  Last BM:  8/29  Height:   Ht Readings from Last 1 Encounters:  03/01/22 _0  (1.499 m)    Weight:   Wt Readings from Last 1 Encounters:  05/06/22 94.1 kg    Ideal Body Weight:  44.3 kg  BMI:  Body mass index is 41.9 kg/m.  Estimated Nutritional Needs:   Kcal:  1800-2100kcal/day  Protein:  90-110g/d  Fluid:  1.4-1.6L/day  CKoleen DistanceMS, RD, LDN Please refer to [facility]for RD and/or RD on-call/weekend/after hours pager

## 2022-05-06 NOTE — Progress Notes (Addendum)
[place] Hospital Day(s): [place] op day(s): 80 Days Post-Op.   Interval History:  Patient seen and examined No issues overnight No abdominal pain; nausea, emesis, fevers No new labs [name] Pouch output 300 ccs + unmeasured in last 24 hours She is on soft; on TPN Ambulating independently   Vital signs in last 24 hours: [min-max] current  Temp:  [98.3 F (36.8 C)-98.9 F (37.2 C)] 98.3 F (36.8 C) (08/29 0332) Pulse Rate:  [71-88] 73 (08/29 0332) Resp:  [16-18] 16 (08/29 0332) BP: (95-103)/(54-63) 103/62 (08/29 0332) SpO2:  [97 %-99 %] 97 % (08/29 0332) Weight:  [94.1 kg] 94.1 kg (08/29 0500)     Height: '4\' 11"'$  (149.9 cm) Weight: 94.1 kg BMI (Calculated): 43.04   Intake/Output last 2 shifts:  08/28 0701 - 08/29 0700 In: 894.5 [P.O.:240; I.V.:654.5] Out: 300 [Drains:300]   Physical Exam:  Constitutional: alert, cooperative and no distress  Respiratory: breathing non-labored at rest  Cardiovascular: regular rate and sinus rhythm  Gastrointestinal: Soft, abdominal soreness on the right, non-distended, no rebound/guarding. Integumentary: Midline wound open, peritoneum closed; granulating;  there are three stomatized areas visible in the LUQ and LLQ portions of the wound, output remains bilious; now on gravity. [name] pouch without leak.  MSK: RUE edema improved; non-tender, peripheral pulses 2+   Labs:     Latest Ref Rng & Units 05/01/2022    7:06 AM 04/29/2022    5:26 AM 04/11/2022    1:27 PM  CBC  WBC 4.0 - 10.5 K/uL 5.1  3.9  4.6   Hemoglobin 12.0 - 15.0 g/dL 7.3  8.0  9.3   Hematocrit 36.0 - 46.0 % 23.0  25.1  30.0   Platelets 150 - 400 K/uL 116  123  174       Latest Ref Rng & Units 05/05/2022    5:43 AM 05/01/2022    5:03 AM 04/28/2022    4:55 AM  CMP  Glucose 70 - 99 mg/dL 123  123  129   BUN 6 - 20 mg/dL '21  20  21   '$ Creatinine 0.44 - 1.00 mg/dL 0.44  0.43  0.40   Sodium 135 - 145 mmol/L 134  133  133   Potassium  3.5 - 5.1 mmol/L 4.1  4.4  4.1   Chloride 98 - 111 mmol/L 104  105  102   CO2 22 - 32 mmol/L '25  24  26   '$ Calcium 8.9 - 10.3 mg/dL 8.1  8.2  8.2   Total Protein 6.5 - 8.1 g/dL 6.4  6.4  6.3   Total Bilirubin 0.3 - 1.2 mg/dL 0.4  0.3  0.4   Alkaline Phos 38 - 126 U/L 102  113  100   AST 15 - 41 U/L '23  26  22   '$ ALT 0 - 44 U/L '15  14  15      '$ Imaging studies:  No new imaging studies   Assessment/Plan: 56 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  - New 08/29: No new issues  - Wound Care: [name] pouch in place; now without red rubber to reduce pressure on wound. This is too foley bag with intermittent suction. There is some issues with pooling in the dependent portion  of the wound/pouch. Continue to return to suction intermittently. These pouches need changed every 7-10 days (ideally). Changed 08/25; switch to gravity with intermittent suction; monitor; may need more frequent changes  - On Soft diet + nutritional supplementation - Continue TPN at goal; not meeting PO needs yet   - Monitor abdominal examination; on-going bowel function  - Pain control prn; antemetic prn  - Out of bed; discharged from therapies; no recommendations at this time   - Discharge Planning: Anticipate lengthy admission and potential eventual transfer   All of the above findings and recommendations were discussed with the patient, and the medical team, and all of patient's questions were answered to her expressed satisfaction.  -- [name], PA-C [facility] 05/06/2022, 7:21 AM M-F: 7am - 4pm

## 2022-05-06 NOTE — Plan of Care (Signed)
Pt AAOx4, no pain, moderate intermittent nausea. Plan to continue nutrition and mobility as tolerated. TPN infusing at goal rate of 19m/hr. Bed in lowest position, call light within reach. Will continue to monitor.

## 2022-05-06 NOTE — Progress Notes (Signed)
Mobility Specialist - Progress Note    05/06/22 1500  Mobility  Activity Ambulated independently in hallway  Level of Assistance Independent  Assistive Device None  Distance Ambulated (ft) 320 ft  Activity Response Tolerated well  $Mobility charge 1 Mobility     Merrily Brittle Mobility Specialist 05/06/22, 3:18 PM

## 2022-05-06 NOTE — Progress Notes (Signed)
PHARMACY - TOTAL PARENTERAL NUTRITION CONSULT NOTE   Indication: Prolonged ileus  Patient Measurements: Height: '4\' 11"'$  (149.9 cm) Weight: 94.1 kg (207 lb 7.3 oz) IBW/kg (Calculated) : 43.2 TPN AdjBW (KG): 55.2 Body mass index is 41.9 kg/m.  Assessment:  56 y.o. female s/p laparotomy, excision of greater omental mass, abdominal wall reconstruction with [name],appendectomy, and placement of Prevena vac.  Glucose / Insulin: No apparent history of diabetes. BG < 180 4 units of SSI moderate required + 15 units insulin in TPN bag Electrolytes:  8/28: stable hyponatremia 134, Mag 1.8  Ca 8.1  alb 2.6 Renal: Scr ~0.35-0.44 Hepatic: No transaminitis. LFTs within normal limits. TG within normal limits Intake/Output: 08/27 0701 - 08/28 0700 In: +2,245 [P.O.:220; I.V.:1299.3] Out: -1250 [Drains: 1,050]  GI Imaging: GI Surgeries / Procedures: s/p laparotomy, excision of greater omental mass, abdominal wall reconstruction with [name], appendectomy, and placement of Prevena vac  Central access: 02/15/22 TPN start date: 02/15/22  Nutritional Goals: Goal TPN rate of 65 mL/hr (provides 106 g of protein and 1872 kcals per day)  RD Assessment:  Estimated Needs Total Energy Estimated Needs: 1800-2100kcal/day Total Protein Estimated Needs: 90-110g/d Total Fluid Estimated Needs: 1.4-1.6L/day  Current Nutrition:  Soft diet+ nutritional supplements,   not meeting PO needs yet 8/29 Thin liquid diet   Plan: Continue TPN at 39m/hr Nutritional Components Amino acids (using 15% Clinisol): 106 grams Dextrose: 265.2 grams Lipids (using 20% SMOFlipids): 54.6 grams kCal: 1872/24h  Electrolytes in TPN (standard): Na 552m/L (Na adjusted from 50 to 55 meq/L on 8/17), K 7036mL, Ca 5mE74m, Mg 10mE71m and Phos 25 mmol/L. Cl:Ac 1:1 Mag 1.8 on 8/28- will order Magnesium sulfate 2 gm IV x 1 Enteral multivitamin. Add trace elements to TPN Continue moderate SSI TIDM + CBG  4x/day (ACHS) + 15u insulin in TPN Monitor TPN labs on Mon/Thurs   KristChinita GreenlandmD Clinical Pharmacist 05/06/2022

## 2022-05-07 LAB — GLUCOSE, CAPILLARY
Glucose-Capillary: 125 mg/dL — ABNORMAL HIGH (ref 70–99)
Glucose-Capillary: 121 mg/dL — ABNORMAL HIGH (ref 70–99)
Glucose-Capillary: 117 mg/dL — ABNORMAL HIGH (ref 70–99)
Glucose-Capillary: 121 mg/dL — ABNORMAL HIGH (ref 70–99)

## 2022-05-07 MED ORDER — TRACE MINERALS CU-MN-SE-ZN 300-55-60-3000 MCG/ML IV SOLN
INTRAVENOUS | Status: AC
  Filled 2022-05-07: qty 707.2

## 2022-05-07 NOTE — Progress Notes (Signed)
Mobility Specialist - Progress Note    05/07/22 1153  Mobility  Activity Ambulated independently in hallway;Stood at bedside;Dangled on edge of bed  Level of Assistance Independent  Assistive Device None  Distance Ambulated (ft) 400 ft  Activity Response Tolerated well  $Mobility charge 1 Mobility    Merrily Brittle Mobility Specialist 05/07/22, 11:55 AM

## 2022-05-07 NOTE — Progress Notes (Signed)
PHARMACY - TOTAL PARENTERAL NUTRITION CONSULT NOTE   Indication: Prolonged ileus  Patient Measurements: Height: '4\' 11"'$  (149.9 cm) Weight: 94.1 kg (207 lb 7.3 oz) IBW/kg (Calculated) : 43.2 TPN AdjBW (KG): 55.2 Body mass index is 41.9 kg/m.  Assessment:  56 y.o. female s/p laparotomy, excision of greater omental mass, abdominal wall reconstruction with Debra [name] release,appendectomy, and placement of Prevena vac.  Glucose / Insulin: No apparent history of diabetes. BG < 180 7 units of SSI moderate required + 15 units insulin in TPN bag Electrolytes:  8/28: stable hyponatremia 134, Mag 1.8  Ca 8.1  alb 2.6 Renal: Scr ~0.35-0.44 Hepatic: No transaminitis. LFTs within normal limits. TG within normal limits Intake/Output: 08/27 0701 - 08/28 0700 In: +1,303.9 [P.O.:220; I.V.:1299.3] Out: -900 [Drains: 600]  GI Imaging: GI Surgeries / Procedures: s/p laparotomy, excision of greater omental mass, abdominal wall reconstruction with Debra [name] release, appendectomy, and placement of Prevena vac  Central access: 02/15/22 TPN start date: 02/15/22  Nutritional Goals: Goal TPN rate of 65 mL/hr (provides 106 g of protein and 1872 kcals per day)  RD Assessment:  Estimated Needs Total Energy Estimated Needs: 1800-2100kcal/day Total Protein Estimated Needs: 90-110g/d Total Fluid Estimated Needs: 1.4-1.6L/day  Current Nutrition:  Soft diet+ nutritional supplements,   not meeting PO needs yet 8/29 Thin liquid diet   Plan: Continue TPN at 60m/hr Nutritional Components Amino acids (using 15% Clinisol): 106 grams Dextrose: 265.2 grams Lipids (using 20% SMOFlipids): 54.6 grams kCal: 1872/24h  Electrolytes in TPN (standard): Na 541m/L (Na adjusted from 50 to 55 meq/L on 8/17), K 7052mL, Ca 5mE25m, Mg 10mE32m and Phos 25 mmol/L. Cl:Ac 1:1 Mag 1.8 on 8/28- will order Magnesium sulfate 2 gm IV x 1 Enteral multivitamin. Add trace elements to TPN Continue moderate SSI TIDM + CBG  4x/day (ACHS) + 15u insulin in TPN Monitor TPN labs on Mon/Thurs   CarolGlean SalvormD Clinical Pharmacist  05/07/2022 10:09 AM

## 2022-05-07 NOTE — Progress Notes (Signed)
[place] Hospital Day(s): [place] op day(s): 81 Days Post-Op.   Interval History:  Patient seen and examined Issues with pouch leaking yesterday afternoon; seems to be resolved this AM No abdominal pain; nausea, emesis, fevers No new labs [name] Pouch output unmeasured in last 24 hours She is on soft; on TPN Ambulating independently   Vital signs in last 24 hours: [min-max] current  Temp:  [98 F (36.7 C)-98.4 F (36.9 C)] 98.1 F (36.7 C) (08/30 0449) Pulse Rate:  [70-77] 72 (08/30 0449) Resp:  [18] 18 (08/30 0449) BP: (98-112)/(54-72) 100/58 (08/30 0449) SpO2:  [100 %] 100 % (08/30 0449)     Height: '4\' 11"'$  (149.9 cm) Weight: 94.1 kg BMI (Calculated): 43.04   Intake/Output last 2 shifts:  08/29 0701 - 08/30 0700 In: 1303.8 [I.V.:1254.9; IV Piggyback:49] Out: 300 [Urine:300]   Physical Exam:  Constitutional: alert, cooperative and no distress  Respiratory: breathing non-labored at rest  Cardiovascular: regular rate and sinus rhythm  Gastrointestinal: Soft, abdominal soreness on the right, non-distended, no rebound/guarding. Integumentary: Midline wound open, peritoneum closed; granulating;  there are three stomatized areas visible in the LUQ and LLQ portions of the wound, output remains bilious; now on gravity. [name] pouch without leak.  MSK: RUE edema improved; non-tender, peripheral pulses 2+   Labs:     Latest Ref Rng & Units 05/01/2022    7:06 AM 04/29/2022    5:26 AM 04/11/2022    1:27 PM  CBC  WBC 4.0 - 10.5 K/uL 5.1  3.9  4.6   Hemoglobin 12.0 - 15.0 g/dL 7.3  8.0  9.3   Hematocrit 36.0 - 46.0 % 23.0  25.1  30.0   Platelets 150 - 400 K/uL 116  123  174       Latest Ref Rng & Units 05/05/2022    5:43 AM 05/01/2022    5:03 AM 04/28/2022    4:55 AM  CMP  Glucose 70 - 99 mg/dL 123  123  129   BUN 6 - 20 mg/dL '21  20  21   '$ Creatinine 0.44 - 1.00 mg/dL 0.44  0.43  0.40   Sodium 135 - 145 mmol/L 134  133  133    Potassium 3.5 - 5.1 mmol/L 4.1  4.4  4.1   Chloride 98 - 111 mmol/L 104  105  102   CO2 22 - 32 mmol/L '25  24  26   '$ Calcium 8.9 - 10.3 mg/dL 8.1  8.2  8.2   Total Protein 6.5 - 8.1 g/dL 6.4  6.4  6.3   Total Bilirubin 0.3 - 1.2 mg/dL 0.4  0.3  0.4   Alkaline Phos 38 - 126 U/L 102  113  100   AST 15 - 41 U/L '23  26  22   '$ ALT 0 - 44 U/L '15  14  15      '$ Imaging studies:  No new imaging studies   Assessment/Plan: 56 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  - New 08/30: Pouch leaking yesterday afternoon; seems to resolved. WOC at bedside to assess; appreciate their help. We will continue current system for now. WOC RN was able to secure "windowless" [employer]. Once this current pouch system fails, we will transition  back to windowless pouch and return to suction.   - Wound Care: [name] pouch in place; now without red rubber to reduce pressure on wound. This is too foley bag with intermittent suction. There is some issues with pooling in the dependent portion of the wound/pouch. Continue to return to suction intermittently. These pouches need changed every 7-10 days (ideally). Changed 08/29; see above for next change recommendations from surgical perspective   - On Soft diet + nutritional supplementation - Continue TPN at goal; not meeting PO needs yet   - Monitor abdominal examination; on-going bowel function  - Pain control prn; antemetic prn  - Out of bed; discharged from therapies; no recommendations at this time   - Discharge Planning: Anticipate lengthy admission and potential eventual transfer   All of the above findings and recommendations were discussed with the patient, and the medical team, and all of  patient's questions were answered to her expressed satisfaction.  -- [name], PA-C [facility] 05/07/2022, 8:17 AM M-F: 7am - 4pm

## 2022-05-07 NOTE — Consult Note (Signed)
[place] Nurse wound follow up Wound type:EC fistula. Wound bed: Red, moist with stomatized fistulae Drainage (amount, consistency, odor) green/yellow effluent Periwound:Not seen today Dressing procedure/placement/frequency:  [name] pouch leaked yesterday morning and again yesterday evening after reapplication, but is currently intact with a good seal. No evidence of leakage. Patient and Bedside RN report that less frequent intermittent suctioning of pooled effluent may be contributing to leakage at the most distal aspects of the pouching system. Bedside RN taught today how to use Yankauer suction to suction by opening "window", or to use an intermittent catheter and feed cathter up into pouch through the drainage spout. Frequent assessment and suctioning excess effluent by Bedside RN is encouraged.  Patient prefers to place a washcloth beneath the distal aspect of the pouch as the plastic against her skin is hot and sticky.  Spanish interpreter is used to enhance patient to nurse communication as family is not in the room at the time of my visit.  The assistance of the Bedside Rn plus the Charge RN, Hortencia Pilar, is appreciated.  Note: Two vertical [name] pouches with no viewing window are placed in the patient's room for future use. One is cut and readied for the next pouch change.  [place] nursing team will not follow, but will remain available to this patient, the nursing and medical teams.  Please re-consult if needed.  Thank you for inviting [name] to participate in this patient's Plan of Care.  Maudie Flakes, MSN, RN, CNS, [place], Serita Grammes, [employer], [employer] phone:  [phone]

## 2022-05-07 NOTE — Plan of Care (Signed)
  Problem: Clinical Measurements: Goal: Will remain free from infection Outcome: Progressing   Problem: Nutrition: Goal: Adequate nutrition will be maintained Outcome: Progressing   Problem: Activity: Goal: Risk for activity intolerance will decrease Outcome: Progressing   Problem: Nutrition: Goal: Adequate nutrition will be maintained Outcome: Progressing   Problem: Coping: Goal: Level of anxiety will decrease Outcome: Progressing   Problem: Pain Managment: Goal: General experience of comfort will improve Outcome: Progressing   

## 2022-05-08 LAB — MAGNESIUM: Magnesium: 1.8 mg/dL (ref 1.7–2.4)

## 2022-05-08 LAB — COMPREHENSIVE METABOLIC PANEL
ALT: 18 U/L (ref 0–44)
AST: 25 U/L (ref 15–41)
Albumin: 2.7 g/dL — ABNORMAL LOW (ref 3.5–5.0)
Alkaline Phosphatase: 103 U/L (ref 38–126)
Anion gap: 4 — ABNORMAL LOW (ref 5–15)
BUN: 20 mg/dL (ref 6–20)
CO2: 26 mmol/L (ref 22–32)
Calcium: 8.3 mg/dL — ABNORMAL LOW (ref 8.9–10.3)
Chloride: 105 mmol/L (ref 98–111)
Creatinine, Ser: 0.41 mg/dL — ABNORMAL LOW (ref 0.44–1.00)
GFR, Estimated: >60 mL/min (ref >60)
Glucose, Bld: 110 mg/dL — ABNORMAL HIGH (ref 70–99)
Potassium: 4.2 mmol/L (ref 3.5–5.1)
Sodium: 135 mmol/L (ref 135–145)
Total Bilirubin: 0.4 mg/dL (ref 0.3–1.2)
Total Protein: 6.6 g/dL (ref 6.5–8.1)

## 2022-05-08 LAB — CBC
HCT: 26.1 % — ABNORMAL LOW (ref 36.0–46.0)
Hemoglobin: 8 g/dL — ABNORMAL LOW (ref 12.0–15.0)
MCH: 24.4 pg — ABNORMAL LOW (ref 26.0–34.0)
MCHC: 30.7 g/dL (ref 30.0–36.0)
MCV: 79.6 fL — ABNORMAL LOW (ref 80.0–100.0)
Platelets: 100 10*3/uL — ABNORMAL LOW (ref 150–400)
RBC: 3.28 MIL/uL — ABNORMAL LOW (ref 3.87–5.11)
RDW: 16.6 % — ABNORMAL HIGH (ref 11.5–15.5)
WBC: 4 10*3/uL (ref 4.0–10.5)
nRBC: 0 % (ref 0.0–0.2)

## 2022-05-08 LAB — TRIGLYCERIDES: Triglycerides: 44 mg/dL (ref <150)

## 2022-05-08 LAB — GLUCOSE, CAPILLARY
Glucose-Capillary: 164 mg/dL — ABNORMAL HIGH (ref 70–99)
Glucose-Capillary: 89 mg/dL (ref 70–99)
Glucose-Capillary: 118 mg/dL — ABNORMAL HIGH (ref 70–99)
Glucose-Capillary: 137 mg/dL — ABNORMAL HIGH (ref 70–99)

## 2022-05-08 LAB — HEPARIN ANTI-XA: Heparin LMW: <0.1 IU/mL

## 2022-05-08 LAB — PHOSPHORUS: Phosphorus: 3.8 mg/dL (ref 2.5–4.6)

## 2022-05-08 MED ORDER — TRACE MINERALS CU-MN-SE-ZN 300-55-60-3000 MCG/ML IV SOLN
INTRAVENOUS | Status: AC
  Filled 2022-05-08: qty 707.2

## 2022-05-08 NOTE — Progress Notes (Signed)
PHARMACY - TOTAL PARENTERAL NUTRITION CONSULT NOTE   Indication: Prolonged ileus  Patient Measurements: Height: '4\' 11"'$  (149.9 cm) Weight: 94.3 kg (207 lb 14.3 oz) IBW/kg (Calculated) : 43.2 TPN AdjBW (KG): 55.2 Body mass index is 41.99 kg/m.  Assessment:  56 y.o. female s/p laparotomy, excision of greater omental mass, abdominal wall reconstruction with [name],appendectomy, and placement of Prevena vac.  Glucose / Insulin: No apparent history of diabetes. BG < 180 7 units of SSI moderate required + 15 units insulin in TPN bag Electrolytes:  8/28: Na stable 135, Mag 1.8  Ca 8.3  alb 2.7 Renal: Scr ~0.35-0.44 Hepatic: No transaminitis. LFTs within normal limits. TG within normal limits Intake/Output: 08/27 0701 - 08/28 0700 In: +1,003.9; cumulative net: +2,345.2 Out: -300 [Drains: 0]   --[name] pouch leaking 8/29. Transitioned to windowless pouches. Intermittent suctioning on hold.   GI Imaging: GI Surgeries / Procedures: s/p laparotomy, excision of greater omental mass, abdominal wall reconstruction with [name], appendectomy, and placement of Prevena vac  Central access: 02/15/22 TPN start date: 02/15/22  Nutritional Goals: Goal TPN rate of 65 mL/hr (provides 106 g of protein and 1872 kcals per day)  RD Assessment:  Estimated Needs Total Energy Estimated Needs: 1800-2100kcal/day Total Protein Estimated Needs: 90-110g/d Total Fluid Estimated Needs: 1.4-1.6L/day  Current Nutrition:  Soft diet+ nutritional supplements,   not meeting PO needs yet 8/29 Thin liquid diet   Plan: Continue TPN at 31m/hr Nutritional Components Amino acids (using 15% Clinisol): 106 grams Dextrose: 265.2 grams Lipids (using 20% SMOFlipids): 54.6 grams kCal: 1872/24h  Electrolytes in TPN (standard): Na 564m/L (Na adjusted from 50 to 55 meq/L on 8/17), K 7023mL, Ca 5mE65m, Mg 10mE39m and Phos 25 mmol/L. Cl:Ac 1:1 Mag 1.8 on 8/28- will order Magnesium sulfate 2 gm  IV x 1 Enteral multivitamin. Add trace elements to TPN Continue moderate SSI TIDM + CBG 4x/day (ACHS) + 15u insulin in TPN Monitor TPN labs on Mon/Thurs Follow I&O and restart of intermittent suctioning.   CarolGlean SalvormD Clinical Pharmacist  05/08/2022 9:52 AM

## 2022-05-08 NOTE — Progress Notes (Signed)
[facility] Day(s): [place] op day(s): 82 Days Post-Op.   Interval History:  Patient seen and examined No more issues with leaking in last 24 hours No abdominal pain; nausea, emesis, fevers Nutritional labs pending this morning  [name] Pouch output unmeasured in last 24 hours She is on soft; on TPN Ambulating independently   Vital signs in last 24 hours: [min-max] current  Temp:  [98 F (36.7 C)-98.5 F (36.9 C)] 98.5 F (36.9 C) (08/31 0356) Pulse Rate:  [71-75] 75 (08/31 0356) Resp:  [14-20] 20 (08/31 0356) BP: (86-108)/(35-57) 103/56 (08/31 0356) SpO2:  [96 %-98 %] 96 % (08/31 0356) Weight:  [94.3 kg] 94.3 kg (08/31 0412)     Height: '4\' 11"'$  (149.9 cm) Weight: 94.3 kg BMI (Calculated): 43.04   Intake/Output last 2 shifts:  No intake/output data recorded.   Physical Exam:  Constitutional: alert, cooperative and no distress  Respiratory: breathing non-labored at rest  Cardiovascular: regular rate and sinus rhythm  Gastrointestinal: Soft, abdominal soreness on the right, non-distended, no rebound/guarding. Integumentary: Midline wound open, peritoneum closed; granulating;  there are three stomatized areas visible in the LUQ and LLQ portions of the wound, output remains bilious; now on gravity. [name] pouch without leak.  MSK: RUE edema improved; non-tender, peripheral pulses 2+   Labs:     Latest Ref Rng & Units 05/01/2022    7:06 AM 04/29/2022    5:26 AM 04/11/2022    1:27 PM  CBC  WBC 4.0 - 10.5 K/uL 5.1  3.9  4.6   Hemoglobin 12.0 - 15.0 g/dL 7.3  8.0  9.3   Hematocrit 36.0 - 46.0 % 23.0  25.1  30.0   Platelets 150 - 400 K/uL 116  123  174       Latest Ref Rng & Units 05/05/2022    5:43 AM 05/01/2022    5:03 AM 04/28/2022    4:55 AM  CMP  Glucose 70 - 99 mg/dL 123  123  129   BUN 6 - 20 mg/dL '21  20  21   '$ Creatinine 0.44 - 1.00 mg/dL 0.44  0.43  0.40   Sodium 135 - 145 mmol/L 134  133  133   Potassium 3.5 - 5.1  mmol/L 4.1  4.4  4.1   Chloride 98 - 111 mmol/L 104  105  102   CO2 22 - 32 mmol/L '25  24  26   '$ Calcium 8.9 - 10.3 mg/dL 8.1  8.2  8.2   Total Protein 6.5 - 8.1 g/dL 6.4  6.4  6.3   Total Bilirubin 0.3 - 1.2 mg/dL 0.4  0.3  0.4   Alkaline Phos 38 - 126 U/L 102  113  100   AST 15 - 41 U/L '23  26  22   '$ ALT 0 - 44 U/L '15  14  15      '$ Imaging studies:  No new imaging studies   Assessment/Plan: 55 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  - New 08/31: No leaking in last 24 hours. We will continue current system for now. WOC RN was able to secure "windowless" [employer]. Once this current pouch system fails, we will transition back to windowless pouch and return to suction. RN  updated this morning. Please call with issues   - Wound Care: [name] pouch in place; now without red rubber to reduce pressure on wound. This is too foley bag with intermittent suction. There is some issues with pooling in the dependent portion of the wound/pouch. Continue to return to suction intermittently. These pouches need changed every 7-10 days (ideally). Changed 08/29; see above for next change recommendations from surgical perspective   - On Soft diet + nutritional supplementation - Continue TPN at goal; not meeting PO needs yet   - Monitor abdominal examination; on-going bowel function  - Pain control prn; antemetic prn  - Out of bed; discharged from therapies; no recommendations at this time   - Discharge Planning: Anticipate lengthy admission and potential eventual transfer   All of the above findings and recommendations were discussed with the patient, and the medical team, and all of patient's questions were answered to her expressed  satisfaction.  -- [name], PA-C [facility] 05/08/2022, 7:29 AM M-F: 7am - 4pm

## 2022-05-08 NOTE — Plan of Care (Signed)
  Problem: Clinical Measurements: Goal: Will remain free from infection Outcome: Progressing   Problem: Nutrition: Goal: Adequate nutrition will be maintained Outcome: Progressing   Problem: Activity: Goal: Risk for activity intolerance will decrease Outcome: Progressing   Problem: Nutrition: Goal: Adequate nutrition will be maintained Outcome: Progressing   Problem: Coping: Goal: Level of anxiety will decrease Outcome: Progressing   Problem: Pain Managment: Goal: General experience of comfort will improve Outcome: Progressing   

## 2022-05-09 LAB — GLUCOSE, CAPILLARY
Glucose-Capillary: 143 mg/dL — ABNORMAL HIGH (ref 70–99)
Glucose-Capillary: 153 mg/dL — ABNORMAL HIGH (ref 70–99)
Glucose-Capillary: 111 mg/dL — ABNORMAL HIGH (ref 70–99)
Glucose-Capillary: 128 mg/dL — ABNORMAL HIGH (ref 70–99)

## 2022-05-09 MED ORDER — TRACE MINERALS CU-MN-SE-ZN 300-55-60-3000 MCG/ML IV SOLN
INTRAVENOUS | Status: AC
  Filled 2022-05-09: qty 707.2

## 2022-05-09 NOTE — Plan of Care (Signed)
  Problem: Clinical Measurements: Goal: Will remain free from infection Outcome: Progressing   Problem: Nutrition: Goal: Adequate nutrition will be maintained Outcome: Progressing   Problem: Activity: Goal: Risk for activity intolerance will decrease Outcome: Progressing   Problem: Nutrition: Goal: Adequate nutrition will be maintained Outcome: Progressing   

## 2022-05-09 NOTE — Progress Notes (Signed)
[place] Hospital Day(s): [place] op day(s): 83 Days Post-Op.   Interval History:  Patient seen and examined No more issues with leaking in last 24 hours No abdominal pain; nausea, emesis, fevers Nutritional labs pending this morning  [name] Pouch output 1225 ccs in last 24 hours She is on soft; on TPN Ambulating independently   Vital signs in last 24 hours: [min-max] current  Temp:  [98.1 F (36.7 C)-98.3 F (36.8 C)] 98.1 F (36.7 C) (09/01 0436) Pulse Rate:  [71-80] 75 (09/01 0436) Resp:  [16-18] 16 (09/01 0436) BP: (95-109)/(50-60) 102/59 (09/01 0436) SpO2:  [95 %-98 %] 96 % (09/01 0436) Weight:  [95.6 kg] 95.6 kg (09/01 0444)     Height: '4\' 11"'$  (149.9 cm) Weight: 95.6 kg BMI (Calculated): 43.04   Intake/Output last 2 shifts:  08/31 0701 - 09/01 0700 In: 60 [P.O.:60] Out: 1225 [Drains:1225]   Physical Exam:  Constitutional: alert, cooperative and no distress  Respiratory: breathing non-labored at rest  Cardiovascular: regular rate and sinus rhythm  Gastrointestinal: Soft, abdominal soreness on the right, non-distended, no rebound/guarding. Integumentary: Midline wound open, peritoneum closed; granulating;  there are three stomatized areas visible in the LUQ and LLQ portions of the wound, output remains bilious; now on gravity. [name] pouch without leak.  MSK: RUE edema improved; non-tender, peripheral pulses 2+   Labs:     Latest Ref Rng & Units 05/08/2022   10:58 AM 05/01/2022    7:06 AM 04/29/2022    5:26 AM  CBC  WBC 4.0 - 10.5 K/uL 4.0  5.1  3.9   Hemoglobin 12.0 - 15.0 g/dL 8.0  7.3  8.0   Hematocrit 36.0 - 46.0 % 26.1  23.0  25.1   Platelets 150 - 400 K/uL 100  116  123       Latest Ref Rng & Units 05/08/2022   10:58 AM 05/05/2022    5:43 AM 05/01/2022    5:03 AM  CMP  Glucose 70 - 99 mg/dL 110  123  123   BUN 6 - 20 mg/dL '20  21  20   '$ Creatinine 0.44 - 1.00 mg/dL 0.41  0.44  0.43   Sodium 135 - 145 mmol/L  135  134  133   Potassium 3.5 - 5.1 mmol/L 4.2  4.1  4.4   Chloride 98 - 111 mmol/L 105  104  105   CO2 22 - 32 mmol/L '26  25  24   '$ Calcium 8.9 - 10.3 mg/dL 8.3  8.1  8.2   Total Protein 6.5 - 8.1 g/dL 6.6  6.4  6.4   Total Bilirubin 0.3 - 1.2 mg/dL 0.4  0.4  0.3   Alkaline Phos 38 - 126 U/L 103  102  113   AST 15 - 41 U/L '25  23  26   '$ ALT 0 - 44 U/L '18  15  14      '$ Imaging studies:  No new imaging studies   Assessment/Plan: 56 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  - New 09/01: Current system not leaking. We will continue current system for now. WOC RN was able to secure "windowless" [employer]. Once this current pouch system fails, we will transition back to windowless pouch and  return to suction. RN updated this morning. Please call with issues   - Wound Care: [name] pouch in place; now without red rubber to reduce pressure on wound. This is too foley bag with intermittent suction. There is some issues with pooling in the dependent portion of the wound/pouch. Continue to return to suction intermittently. These pouches need changed every 7-10 days (ideally). Changed 08/29; see above for next change recommendations from surgical perspective   - On Soft diet + nutritional supplementation - Continue TPN at goal; not meeting PO needs yet   - Monitor abdominal examination; on-going bowel function  - Pain control prn; antemetic prn  - Out of bed; discharged from therapies; no recommendations at this time   - Discharge Planning: Anticipate lengthy admission and potential eventual transfer   All of the above findings and recommendations were discussed with the patient, and the medical team, and all of patient's questions  were answered to her expressed satisfaction.  -- [name], PA-C [facility] 05/09/2022, 7:21 AM M-F: 7am - 4pm

## 2022-05-09 NOTE — Progress Notes (Signed)
Brief Progress Note Discussed with RN, patient's current [name] pouch system leaking. I was able to get her switched over to windowless pouches this evening and returned to suction with red rubber catheter. Patient did well. I will attach pictures.    Midline Wound (05/09/2022)    [place] (05/09/2022):     Plan:  -- Continue current system; will trial LIS to limit pressure on wound bed -- Okay to clamp/return to gravity bag for ambulation and to use restroom -- During next pouch change, would recommend cutting to allow for "spout" to be lateral instead of inferior. I suspect this will help maintain the red rubber in the dependent portion of the pouch and control effluent.  -- No other new changes  -- [name], PA-C [facility] 05/09/2022, 6:23 PM M-F: 7am - 4pm

## 2022-05-09 NOTE — Progress Notes (Signed)
PHARMACY - TOTAL PARENTERAL NUTRITION CONSULT NOTE   Indication: Prolonged ileus  Patient Measurements: Height: '4\' 11"'$  (149.9 cm) Weight: 95.6 kg (210 lb 12.2 oz) IBW/kg (Calculated) : 43.2 TPN AdjBW (KG): 55.2 Body mass index is 42.57 kg/m.  Assessment:  56 y.o. female s/p laparotomy, excision of greater omental mass, abdominal wall reconstruction with [name],appendectomy, and placement of Prevena vac.  Glucose / Insulin: No apparent history of diabetes. BG < 180 7 units of SSI moderate required + 15 units insulin in TPN bag Electrolytes:  8/28: Na stable 135, Mag 1.8  Ca 8.3  alb 2.7 Renal: Scr ~0.35-0.44 Hepatic: No transaminitis. LFTs within normal limits. TG within normal limits Intake/Output: 08/27 0701 - 08/28 0700 In: +1,003.9; cumulative net: +2,345.2 Out: -300 [Drains: 0]   --[name] pouch leaking 8/29. Transitioned to windowless pouches. Intermittent suctioning on hold.   GI Imaging: GI Surgeries / Procedures: s/p laparotomy, excision of greater omental mass, abdominal wall reconstruction with [name], appendectomy, and placement of Prevena vac  Central access: 02/15/22 TPN start date: 02/15/22  Nutritional Goals: Goal TPN rate of 65 mL/hr (provides 106 g of protein and 1872 kcals per day)  RD Assessment:  Estimated Needs Total Energy Estimated Needs: 1800-2100kcal/day Total Protein Estimated Needs: 90-110g/d Total Fluid Estimated Needs: 1.4-1.6L/day  Current Nutrition:  Soft diet+ nutritional supplements,   not meeting PO needs yet 8/29 Thin liquid diet   Plan: Continue TPN at 40m/hr Nutritional Components Amino acids (using 15% Clinisol): 106 grams Dextrose: 265.2 grams Lipids (using 20% SMOFlipids): 54.6 grams kCal: 1872/24h  Electrolytes in TPN (standard): Na 531m/L (Na adjusted from 50 to 55 meq/L on 8/17), K 7026mL, Ca 5mE34m, Mg 10mE69m and Phos 25 mmol/L. Cl:Ac 1:1 Enteral multivitamin. Add trace elements to  TPN Continue moderate SSI TIDM + CBG 4x/day (ACHS) + 15u insulin in TPN Monitor TPN labs on Mon/Thurs Follow I&O   Jacqui Headen Rodriguez-Guzman PharmD, BCPS 05/09/2022 10:11 AM

## 2022-05-10 LAB — CBC
HCT: 26.5 % — ABNORMAL LOW (ref 36.0–46.0)
Hemoglobin: 8.1 g/dL — ABNORMAL LOW (ref 12.0–15.0)
MCH: 24.2 pg — ABNORMAL LOW (ref 26.0–34.0)
MCHC: 30.6 g/dL (ref 30.0–36.0)
MCV: 79.1 fL — ABNORMAL LOW (ref 80.0–100.0)
Platelets: 90 10*3/uL — ABNORMAL LOW (ref 150–400)
RBC: 3.35 MIL/uL — ABNORMAL LOW (ref 3.87–5.11)
RDW: 16.6 % — ABNORMAL HIGH (ref 11.5–15.5)
WBC: 4.4 10*3/uL (ref 4.0–10.5)
nRBC: 0 % (ref 0.0–0.2)

## 2022-05-10 LAB — GLUCOSE, CAPILLARY
Glucose-Capillary: 151 mg/dL — ABNORMAL HIGH (ref 70–99)
Glucose-Capillary: 146 mg/dL — ABNORMAL HIGH (ref 70–99)
Glucose-Capillary: 138 mg/dL — ABNORMAL HIGH (ref 70–99)
Glucose-Capillary: 143 mg/dL — ABNORMAL HIGH (ref 70–99)

## 2022-05-10 MED ORDER — MAGNESIUM SULFATE 2 GM/50ML IV SOLN
2.0000 g | Freq: Once | INTRAVENOUS | Status: AC
  Administered 2022-05-10: 2 g via INTRAVENOUS
  Filled 2022-05-10: qty 50

## 2022-05-10 MED ORDER — TRACE MINERALS CU-MN-SE-ZN 300-55-60-3000 MCG/ML IV SOLN
INTRAVENOUS | Status: AC
  Filled 2022-05-10: qty 707.2

## 2022-05-10 NOTE — Progress Notes (Signed)
PHARMACY - TOTAL PARENTERAL NUTRITION CONSULT NOTE   Indication: Prolonged ileus  Patient Measurements: Height: '4\' 11"'$  (149.9 cm) Weight: 95.6 kg (210 lb 12.2 oz) IBW/kg (Calculated) : 43.2 TPN AdjBW (KG): 55.2 Body mass index is 42.57 kg/m.  Assessment:  56 y.o. female s/p laparotomy, excision of greater omental mass, abdominal wall reconstruction with [name],appendectomy, and placement of Prevena vac.  Glucose / Insulin: No apparent history of diabetes. BG < 111-153 5 units of SSI moderate required + 15 units insulin in TPN bag Electrolytes:  8/31: Na stable 135, Mag 1.8  Ca 8.3  alb 2.7 Renal: Scr ~0.35-0.44 Hepatic: No transaminitis. LFTs within normal limits. TG within normal limits Intake/Output: 08/27 0701 - 08/28 0700 In: +1,003.9; cumulative net: +2,345.2 Out: -300 [Drains: 0]   --[name] pouch leaking 8/29. Transitioned to windowless pouches. Intermittent suctioning on hold.   GI Imaging: GI Surgeries / Procedures: s/p laparotomy, excision of greater omental mass, abdominal wall reconstruction with [name], appendectomy, and placement of Prevena vac  Central access: 02/15/22 TPN start date: 02/15/22  Nutritional Goals: Goal TPN rate of 65 mL/hr (provides 106 g of protein and 1872 kcals per day)  RD Assessment:  Estimated Needs Total Energy Estimated Needs: 1800-2100kcal/day Total Protein Estimated Needs: 90-110g/d Total Fluid Estimated Needs: 1.4-1.6L/day  Current Nutrition:  Soft diet+ nutritional supplements,   not meeting PO needs yet 8/29 Thin liquid diet   Plan: Continue TPN at 47m/hr Nutritional Components Amino acids (using 15% Clinisol): 106 grams Dextrose: 265.2 grams Lipids (using 20% SMOFlipids): 54.6 grams kCal: 1872/24h  Electrolytes in TPN (standard): Na 557m/L (Na adjusted from 50 to 55 meq/L on 8/17), K 7029mL, Ca 5mE73m, Mg 10 mEq/L , and Phos 25 mmol/L. Cl:Ac 1:1 Mag has been running on lower end at 1.8.  Will order Magnesium sulfate 2 gm IV x1 Enteral multivitamin. Add trace elements to TPN Continue moderate SSI TIDM + CBG 4x/day (ACHS) + 15u insulin in TPN Monitor TPN labs on Mon/Thurs Follow I&O   KrisChinita GreenlandrmD Clinical Pharmacist 05/10/2022

## 2022-05-10 NOTE — Progress Notes (Signed)
Patient had no further nausea episodes, however continues to eat very little. Patient states she is hungry, but can't seem to eat. RN asked if there is anything she may like to eat, but patient did not have any suggestions at this time.   Fuller Mandril, RN

## 2022-05-10 NOTE — Progress Notes (Signed)
Subjective:  CC: Debra [name] is a 56 y.o. female  Hospital stay day 28, 84 Days Post-Op high output enterocutaneous fistula. s/p re-opening of laparotomy for repair of small bowel perforation following initial laparotomy, excision of greater omental mass, abdominal wall reconstruction with [name], appendectomy, and placement of Prevena vac on 06/08.  HPI: New onset nausea this am.  Sudden onset, no emesis.  Worse with eating and drinking, denies dysphagia.  NO abdominal pain and drain continues to have stool output  ROS:  General: Denies weight loss, weight gain, fatigue, fevers, chills, and night sweats. Heart: Denies chest pain, palpitations, racing heart, irregular heartbeat, leg pain or swelling, and decreased activity tolerance. Respiratory: Denies breathing difficulty, shortness of breath, wheezing, cough, and sputum. GI: Denies change in appetite, heartburn,  vomiting, constipation, diarrhea, and blood in stool. GU: Denies difficulty urinating, pain with urinating, urgency, frequency, blood in urine.   Objective:   Temp:  [98.1 F (36.7 C)-98.3 F (36.8 C)] 98.2 F (36.8 C) (09/02 0445) Pulse Rate:  [71-79] 79 (09/02 0445) Resp:  [16-20] 16 (09/02 0445) BP: (96-110)/(46-60) 96/46 (09/02 0445) SpO2:  [89 %-99 %] 99 % (09/02 0445)     Height: '4\' 11"'$  (149.9 cm) Weight: 95.6 kg BMI (Calculated): 43.04   Intake/Output this shift:   Intake/Output Summary (Last 24 hours) at 05/10/2022 0733 Last data filed at 05/10/2022 0431 Gross per 24 hour  Intake 760.48 ml  Output 120 ml  Net 640.48 ml    Constitutional :  alert, cooperative, appears stated age, and no distress  Respiratory:  clear to auscultation bilaterally  Cardiovascular:  regular rate and rhythm  Gastrointestinal: Soft, no tenderness to palpation.  [name] pouch intact .  No evidence of recent leak.  Skin: Cool and moist.   Psychiatric: Normal affect, non-agitated, not confused       LABS:      Latest Ref Rng & Units 05/08/2022   10:58 AM 05/05/2022    5:43 AM 05/01/2022    5:03 AM  CMP  Glucose 70 - 99 mg/dL 110  123  123   BUN 6 - 20 mg/dL '20  21  20   '$ Creatinine 0.44 - 1.00 mg/dL 0.41  0.44  0.43   Sodium 135 - 145 mmol/L 135  134  133   Potassium 3.5 - 5.1 mmol/L 4.2  4.1  4.4   Chloride 98 - 111 mmol/L 105  104  105   CO2 22 - 32 mmol/L '26  25  24   '$ Calcium 8.9 - 10.3 mg/dL 8.3  8.1  8.2   Total Protein 6.5 - 8.1 g/dL 6.6  6.4  6.4   Total Bilirubin 0.3 - 1.2 mg/dL 0.4  0.4  0.3   Alkaline Phos 38 - 126 U/L 103  102  113   AST 15 - 41 U/L '25  23  26   '$ ALT 0 - 44 U/L '18  15  14       '$ Latest Ref Rng & Units 05/10/2022    5:18 AM 05/08/2022   10:58 AM 05/01/2022    7:06 AM  CBC  WBC 4.0 - 10.5 K/uL 4.4  4.0  5.1   Hemoglobin 12.0 - 15.0 g/dL 8.1  8.0  7.3   Hematocrit 36.0 - 46.0 % 26.5  26.1  23.0   Platelets 150 - 400 K/uL 90  100  116     RADS: N/a Assessment:   high output enterocutaneous fistula. s/p re-opening  of laparotomy for repair of small bowel perforation following initial laparotomy, excision of greater omental mass, abdominal wall reconstruction with [name], appendectomy, and placement of Prevena vac on 06/08.  New onset nausea reported.  She has had recurrent episodes such as this multiple times during her stay.  No specifc interventions needed the last few times.  Clinically no other concerning issues, so will continue with symptom management and monitor for now.  labs/images/medications/previous chart entries reviewed personally and relevant changes/updates noted above.

## 2022-05-11 LAB — GLUCOSE, CAPILLARY
Glucose-Capillary: 133 mg/dL — ABNORMAL HIGH (ref 70–99)
Glucose-Capillary: 165 mg/dL — ABNORMAL HIGH (ref 70–99)
Glucose-Capillary: 125 mg/dL — ABNORMAL HIGH (ref 70–99)
Glucose-Capillary: 131 mg/dL — ABNORMAL HIGH (ref 70–99)

## 2022-05-11 MED ORDER — TRACE MINERALS CU-MN-SE-ZN 300-55-60-3000 MCG/ML IV SOLN
INTRAVENOUS | Status: AC
  Filled 2022-05-11: qty 707.2

## 2022-05-11 NOTE — Plan of Care (Signed)
  Problem: Activity: Goal: Risk for activity intolerance will decrease Outcome: Progressing   

## 2022-05-11 NOTE — Progress Notes (Signed)
Subjective:  CC: Debra [name] is a 56 y.o. female  Hospital stay day 2, 85 Days Post-Op high output enterocutaneous fistula. s/p re-opening of laparotomy for repair of small bowel perforation following initial laparotomy, excision of greater omental mass, abdominal wall reconstruction with [name], appendectomy, and placement of Prevena vac on 06/08.  HPI: No significant worsening of nausea that was reported yesterday.  No specific new complaints  ROS:  General: Denies weight loss, weight gain, fatigue, fevers, chills, and night sweats. Heart: Denies chest pain, palpitations, racing heart, irregular heartbeat, leg pain or swelling, and decreased activity tolerance. Respiratory: Denies breathing difficulty, shortness of breath, wheezing, cough, and sputum. GI: Denies change in appetite, heartburn,  vomiting, constipation, diarrhea, and blood in stool. GU: Denies difficulty urinating, pain with urinating, urgency, frequency, blood in urine.   Objective:   Temp:  [98.2 F (36.8 C)-98.3 F (36.8 C)] 98.3 F (36.8 C) (09/03 0757) Pulse Rate:  [73-92] 73 (09/03 0757) Resp:  [16-20] 20 (09/03 0757) BP: (104-110)/(54-62) 106/54 (09/03 0757) SpO2:  [96 %-98 %] 96 % (09/03 0757) Weight:  [95.7 kg] 95.7 kg (09/03 0917)     Height: '4\' 11"'$  (149.9 cm) Weight: 95.7 kg BMI (Calculated): 43.04   Intake/Output this shift:   Intake/Output Summary (Last 24 hours) at 05/11/2022 1025 Last data filed at 05/11/2022 0218 Gross per 24 hour  Intake 1793.3 ml  Output 400 ml  Net 1393.3 ml    Constitutional :  alert, cooperative, appears stated age, and no distress  Respiratory:  clear to auscultation bilaterally  Cardiovascular:  regular rate and rhythm  Gastrointestinal: Soft, no tenderness to palpation.  [name] pouch intact .  No evidence of recent leak.  Skin: Cool and moist.   Psychiatric: Normal affect, non-agitated, not confused       LABS:     Latest Ref Rng & Units  05/08/2022   10:58 AM 05/05/2022    5:43 AM 05/01/2022    5:03 AM  CMP  Glucose 70 - 99 mg/dL 110  123  123   BUN 6 - 20 mg/dL '20  21  20   '$ Creatinine 0.44 - 1.00 mg/dL 0.41  0.44  0.43   Sodium 135 - 145 mmol/L 135  134  133   Potassium 3.5 - 5.1 mmol/L 4.2  4.1  4.4   Chloride 98 - 111 mmol/L 105  104  105   CO2 22 - 32 mmol/L '26  25  24   '$ Calcium 8.9 - 10.3 mg/dL 8.3  8.1  8.2   Total Protein 6.5 - 8.1 g/dL 6.6  6.4  6.4   Total Bilirubin 0.3 - 1.2 mg/dL 0.4  0.4  0.3   Alkaline Phos 38 - 126 U/L 103  102  113   AST 15 - 41 U/L '25  23  26   '$ ALT 0 - 44 U/L '18  15  14       '$ Latest Ref Rng & Units 05/10/2022    5:18 AM 05/08/2022   10:58 AM 05/01/2022    7:06 AM  CBC  WBC 4.0 - 10.5 K/uL 4.4  4.0  5.1   Hemoglobin 12.0 - 15.0 g/dL 8.1  8.0  7.3   Hematocrit 36.0 - 46.0 % 26.5  26.1  23.0   Platelets 150 - 400 K/uL 90  100  116     RADS: N/a Assessment:   high output enterocutaneous fistula. s/p re-opening of laparotomy for repair of small bowel  perforation following initial laparotomy, excision of greater omental mass, abdominal wall reconstruction with [name], appendectomy, and placement of Prevena vac on 06/08.  Nausea seems to have improved.  No other concerning issues on physical exam.  We will continue to monitor for today.  labs/images/medications/previous chart entries reviewed personally and relevant changes/updates noted above.

## 2022-05-11 NOTE — Progress Notes (Signed)
PHARMACY - TOTAL PARENTERAL NUTRITION CONSULT NOTE   Indication: Prolonged ileus  Patient Measurements: Height: '4\' 11"'$  (149.9 cm) Weight: 95.6 kg (210 lb 12.2 oz) IBW/kg (Calculated) : 43.2 TPN AdjBW (KG): 55.2 Body mass index is 42.57 kg/m.  Assessment:  56 y.o. female s/p laparotomy, excision of greater omental mass, abdominal wall reconstruction with [name],appendectomy, and placement of Prevena vac.  Glucose / Insulin: No apparent history of diabetes. BG < 133-151 4 units of SSI moderate over last 24 hrs + 15 units insulin in TPN bag Electrolytes:  8/31: Na stable 135, Mag 1.8  Ca 8.3  alb 2.7 Renal: Scr ~0.35-0.44 Hepatic: No transaminitis. LFTs within normal limits. TG within normal limits Intake/Output: 08/27 0701 - 08/28 0700 In: +1,003.9; cumulative net: +2,345.2 Out: -300 [Drains: 0]   --[name] pouch leaking 8/29. Transitioned to windowless pouches. Intermittent suctioning on hold.   GI Imaging: GI Surgeries / Procedures: s/p laparotomy, excision of greater omental mass, abdominal wall reconstruction with [name], appendectomy, and placement of Prevena vac  Central access: 02/15/22 TPN start date: 02/15/22  Nutritional Goals: Goal TPN rate of 65 mL/hr (provides 106 g of protein and 1872 kcals per day)  RD Assessment:  Estimated Needs Total Energy Estimated Needs: 1800-2100kcal/day Total Protein Estimated Needs: 90-110g/d Total Fluid Estimated Needs: 1.4-1.6L/day  Current Nutrition:  Soft diet+ nutritional supplements,   not meeting PO needs yet 8/29 Thin liquid diet   Plan: Continue TPN at 27m/hr Nutritional Components Amino acids (using 15% Clinisol): 106 grams Dextrose: 265.2 grams Lipids (using 20% SMOFlipids): 54.6 grams kCal: 1872/24h  Electrolytes in TPN (standard): Na 532m/L (Na adjusted from 50 to 55 meq/L on 8/17), K 7068mL, Ca 5mE5m, Mg 10 mEq/L , and Phos 25 mmol/L. Cl:Ac 1:1 Enteral multivitamin. Add trace  elements to TPN Continue moderate SSI TIDM + CBG 4x/day (ACHS) + 15u insulin in TPN Monitor TPN labs on Mon/Thurs Follow I&O   KrisChinita GreenlandrmD Clinical Pharmacist 05/11/2022

## 2022-05-12 LAB — CBC
HCT: 25.5 % — ABNORMAL LOW (ref 36.0–46.0)
Hemoglobin: 7.9 g/dL — ABNORMAL LOW (ref 12.0–15.0)
MCH: 24.3 pg — ABNORMAL LOW (ref 26.0–34.0)
MCHC: 31 g/dL (ref 30.0–36.0)
MCV: 78.5 fL — ABNORMAL LOW (ref 80.0–100.0)
Platelets: 87 10*3/uL — ABNORMAL LOW (ref 150–400)
RBC: 3.25 MIL/uL — ABNORMAL LOW (ref 3.87–5.11)
RDW: 16.8 % — ABNORMAL HIGH (ref 11.5–15.5)
WBC: 3.8 10*3/uL — ABNORMAL LOW (ref 4.0–10.5)
nRBC: 0 % (ref 0.0–0.2)

## 2022-05-12 LAB — COMPREHENSIVE METABOLIC PANEL
ALT: 19 U/L (ref 0–44)
AST: 26 U/L (ref 15–41)
Albumin: 2.7 g/dL — ABNORMAL LOW (ref 3.5–5.0)
Alkaline Phosphatase: 109 U/L (ref 38–126)
Anion gap: 4 — ABNORMAL LOW (ref 5–15)
BUN: 20 mg/dL (ref 6–20)
CO2: 25 mmol/L (ref 22–32)
Calcium: 8.3 mg/dL — ABNORMAL LOW (ref 8.9–10.3)
Chloride: 105 mmol/L (ref 98–111)
Creatinine, Ser: 0.43 mg/dL — ABNORMAL LOW (ref 0.44–1.00)
GFR, Estimated: >60 mL/min (ref >60)
Glucose, Bld: 135 mg/dL — ABNORMAL HIGH (ref 70–99)
Potassium: 4.5 mmol/L (ref 3.5–5.1)
Sodium: 134 mmol/L — ABNORMAL LOW (ref 135–145)
Total Bilirubin: 0.6 mg/dL (ref 0.3–1.2)
Total Protein: 6.4 g/dL — ABNORMAL LOW (ref 6.5–8.1)

## 2022-05-12 LAB — GLUCOSE, CAPILLARY
Glucose-Capillary: 131 mg/dL — ABNORMAL HIGH (ref 70–99)
Glucose-Capillary: 138 mg/dL — ABNORMAL HIGH (ref 70–99)
Glucose-Capillary: 108 mg/dL — ABNORMAL HIGH (ref 70–99)
Glucose-Capillary: 135 mg/dL — ABNORMAL HIGH (ref 70–99)

## 2022-05-12 LAB — TRIGLYCERIDES: Triglycerides: 57 mg/dL (ref <150)

## 2022-05-12 LAB — MAGNESIUM: Magnesium: 1.7 mg/dL (ref 1.7–2.4)

## 2022-05-12 LAB — PHOSPHORUS: Phosphorus: 4.1 mg/dL (ref 2.5–4.6)

## 2022-05-12 MED ORDER — MAGNESIUM SULFATE 2 GM/50ML IV SOLN
2.0000 g | Freq: Once | INTRAVENOUS | Status: AC
  Administered 2022-05-12: 2 g via INTRAVENOUS
  Filled 2022-05-12: qty 50

## 2022-05-12 MED ORDER — TRACE MINERALS CU-MN-SE-ZN 300-55-60-3000 MCG/ML IV SOLN
INTRAVENOUS | Status: AC
  Filled 2022-05-12: qty 707.2

## 2022-05-12 NOTE — TOC Progression Note (Signed)
Transition of Care [facility]) - Progression Note    Patient Details  Name: Debra [name] MRN: [id number] Date of Birth: 10/13/1965  Transition [facility]) CM/SW [place], LCSW Phone Number: 05/12/2022, 9:19 AM  Clinical Narrative:   TOC continues to follow for disposition needs.   Expected Discharge Plan: [place] Barriers to Discharge: Continued Medical Work up  Expected Discharge Plan and Services Expected Discharge Plan: [place]                                               Social Determinants of Health (SDOH) Interventions    Readmission Risk Interventions     No data to display

## 2022-05-12 NOTE — Progress Notes (Signed)
PHARMACY - TOTAL PARENTERAL NUTRITION CONSULT NOTE   Indication: Prolonged ileus  Patient Measurements: Height: '4\' 11"'$  (149.9 cm) Weight: 95.7 kg (210 lb 15.7 oz) IBW/kg (Calculated) : 43.2 TPN AdjBW (KG): 55.2 Body mass index is 42.61 kg/m.  Assessment:  56 y.o. female s/p laparotomy, excision of greater omental mass, abdominal wall reconstruction with [name],appendectomy, and placement of Prevena vac.  Glucose / Insulin: No apparent history of diabetes. BG < 133-151 4 units of SSI moderate over last 24 hrs + 15 units insulin in TPN bag Electrolytes:  9/4: Na stable 134, Mag 1.7  Ca 8.3  alb 2.7, Phos 4.1, K 4.5 Renal: Scr ~0.35-0.44 Hepatic: No transaminitis. LFTs within normal limits. TG within normal limits Intake/Output: 09/03 0701 - 09/04 0700 In: +1,003.9; cumulative net: +5.6L Out: -225 [Drains: 0]   --[name] pouch leaking 8/29. Transitioned to windowless pouches. Intermittent suctioning on hold.   GI Imaging: GI Surgeries / Procedures: s/p laparotomy, excision of greater omental mass, abdominal wall reconstruction with [name], appendectomy, and placement of Prevena vac  Central access: 02/15/22 TPN start date: 02/15/22  Nutritional Goals: Goal TPN rate of 65 mL/hr (provides 106 g of protein and 1872 kcals per day)  RD Assessment:  Estimated Needs Total Energy Estimated Needs: 1800-2100kcal/day Total Protein Estimated Needs: 90-110g/d Total Fluid Estimated Needs: 1.4-1.6L/day  Current Nutrition:  Soft diet+ nutritional supplements,   not meeting PO needs yet 8/29 Thin liquid diet   Plan: Continue TPN at 110m/hr Nutritional Components Amino acids (using 15% Clinisol): 106 grams Dextrose: 265.2 grams Lipids (using 20% SMOFlipids): 54.6 grams kCal: 1872/24h  Electrolytes in TPN (standard): Na 554m/L (Na adjusted from 50 to 55 meq/L on 8/17), K adjusted from 70 to 5079mL, Ca 5mE75m, Mg 10 mEq/L , and Phos adjusted from 25 to 10  mmol/L. Cl:Ac 1:1 Enteral multivitamin. Add trace elements to TPN Continue moderate SSI TIDM + CBG 4x/day (ACHS) + 15u insulin in TPN Monitor TPN labs on Mon/Thurs Follow I&O   WaliPearla DubonnetarmD Clinical Pharmacist 05/12/2022 9:46 AM

## 2022-05-12 NOTE — Progress Notes (Signed)
CC: EC fistula Subjective: Nausea resolved. Windowless pouch working well Taking diet  Objective: Vital signs in last 24 hours: Temp:  [98.1 F (36.7 C)-98.5 F (36.9 C)] 98.1 F (36.7 C) (09/04 0831) Pulse Rate:  [73-81] 73 (09/04 0831) Resp:  [16-20] 18 (09/04 0831) BP: (98-106)/(50-56) 104/55 (09/04 0831) SpO2:  [97 %-100 %] 97 % (09/04 0831) Last BM Date : 05/12/22  Intake/Output from previous day: 09/03 0701 - 09/04 0700 In: 2214 [P.O.:660; I.V.:1554] Out: 225 [Stool:225] Intake/Output this shift: No intake/output data recorded.  Physical exam: NAD alert Abd: eakin pouch w good seal.. unchanged fistulas  Lab Results: CBC  Recent Labs    05/10/22 0518  WBC 4.4  HGB 8.1*  HCT 26.5*  PLT 90*   BMET Recent Labs    05/12/22 0750  NA 134*  K 4.5  CL 105  CO2 25  GLUCOSE 135*  BUN 20  CREATININE 0.43*  CALCIUM 8.3*   PT/INR No results for input(s): "LABPROT", "INR" in the last 72 hours. ABG No results for input(s): "PHART", "HCO3" in the last 72 hours.  Invalid input(s): "PCO2", "PO2"  Studies/Results: No results found.  Anti-infectives: Anti-infectives (From admission, onward)    Start     Dose/Rate Route Frequency Ordered Stop   03/04/22 2200  piperacillin-tazobactam (ZOSYN) IVPB 3.375 g        3.375 g 12.5 mL/hr over 240 Minutes Intravenous Every 8 hours 03/04/22 2005 03/18/22 0248   02/21/22 1500  vancomycin (VANCOREADY) IVPB 1250 mg/250 mL  Status:  Discontinued        1,250 mg 166.7 mL/hr over 90 Minutes Intravenous Every 24 hours 02/21/22 1111 02/24/22 1336   02/21/22 1400  meropenem (MERREM) 1 g in sodium chloride 0.9 % 100 mL IVPB        1 g 200 mL/hr over 30 Minutes Intravenous Every 8 hours 02/21/22 1258 02/27/22 2157   02/20/22 1500  vancomycin (VANCOCIN) IVPB 1000 mg/200 mL premix  Status:  Discontinued        1,000 mg 200 mL/hr over 60 Minutes Intravenous Every 24 hours 02/19/22 1706 02/21/22 1111   02/19/22 1115  vancomycin  (VANCOREADY) IVPB 2000 mg/400 mL        2,000 mg 200 mL/hr over 120 Minutes Intravenous  Once 02/19/22 1025 02/19/22 1732   02/19/22 1115  fluconazole (DIFLUCAN) IVPB 400 mg        400 mg 100 mL/hr over 120 Minutes Intravenous Every 24 hours 02/19/22 1025 03/04/22 1301   02/15/22 0730  piperacillin-tazobactam (ZOSYN) IVPB 3.375 g  Status:  Discontinued        3.375 g 12.5 mL/hr over 240 Minutes Intravenous Every 8 hours 02/15/22 0639 02/21/22 1235   02/15/22 0655  piperacillin-tazobactam (ZOSYN) 3.375 GM/50ML IVPB       Note to Pharmacy: Glenford Peers N: cabinet override      02/15/22 0655 02/15/22 0816   02/13/22 2200  cefoTEtan (CEFOTAN) 2 g in sodium chloride 0.9 % 100 mL IVPB  Status:  Discontinued        2 g 200 mL/hr over 30 Minutes Intravenous Every 12 hours 02/13/22 1541 02/15/22 0639   02/13/22 1345  cefoTEtan (CEFOTAN) 2 g in sodium chloride 0.9 % 100 mL IVPB        2 g 200 mL/hr over 30 Minutes Intravenous  Once 02/13/22 1331 02/13/22 2146   02/13/22 1336  sodium chloride 0.9 % with cefoTEtan (CEFOTAN) ADS Med       Note to  Pharmacy: Rulon Sera: cabinet override      02/13/22 1336 02/14/22 0144   02/13/22 0620  sodium chloride 0.9 % with cefoTEtan (CEFOTAN) ADS Med       Note to Pharmacy: Norton Blizzard A: cabinet override      02/13/22 0620 02/13/22 0802   02/13/22 0600  cefoTEtan (CEFOTAN) 2 g in sodium chloride 0.9 % 100 mL IVPB        2 g 200 mL/hr over 30 Minutes Intravenous On call to O.R. 02/12/22 2229 02/13/22 2146       Assessment/Plan: EC fistula , good control w [name] Continue tpn and support   Caroleen Hamman, MD, FACS  05/12/2022

## 2022-05-13 LAB — GLUCOSE, CAPILLARY
Glucose-Capillary: 132 mg/dL — ABNORMAL HIGH (ref 70–99)
Glucose-Capillary: 77 mg/dL (ref 70–99)
Glucose-Capillary: 123 mg/dL — ABNORMAL HIGH (ref 70–99)
Glucose-Capillary: 138 mg/dL — ABNORMAL HIGH (ref 70–99)

## 2022-05-13 MED ORDER — TRACE MINERALS CU-MN-SE-ZN 300-55-60-3000 MCG/ML IV SOLN
INTRAVENOUS | Status: AC
  Filled 2022-05-13: qty 707.2

## 2022-05-13 NOTE — Progress Notes (Signed)
PHARMACY - TOTAL PARENTERAL NUTRITION CONSULT NOTE   Indication: Prolonged ileus  Patient Measurements: Height: '4\' 11"'$  (149.9 cm) Weight: 95.4 kg (210 lb 5.1 oz) IBW/kg (Calculated) : 43.2 TPN AdjBW (KG): 55.2 Body mass index is 42.48 kg/m.  Assessment: Debra [name] is a 56 y.o. female s/p laparotomy, excision of greater omental mass, abdominal wall reconstruction with [name], appendectomy, and placement of Prevena vac.  Glucose / Insulin: No apparent history of diabetes. BG 135-138. 4 units of SSI moderate over last 24 hrs + 15 units insulin in TPN bag Electrolytes: 9/4: Na stable 134, Mag 1.7, Ca 8.3, alb 2.7, Phos 4.1, K 4.5 Renal: Scr 0.43 (baseline 0.3-0.6) Hepatic: No transaminitis. LFTs within normal limits. TG within normal limits  GI Imaging: No new imaging GI Surgeries / Procedures: s/p laparotomy, excision of greater omental mass, abdominal wall reconstruction with [name], appendectomy, and placement of Prevena vac  Central access: 02/15/22 TPN start date: 02/15/22  Nutritional Goals: Goal TPN rate of 65 mL/hr (provides 106 g of protein and 1872 kcals per day)  RD Assessment:  Estimated Needs Total Energy Estimated Needs: 1800-2100kcal/day Total Protein Estimated Needs: 90-110g/d Total Fluid Estimated Needs: 1.4-1.6L/day  Current Nutrition:  Soft diet + nutritional supplements, not meeting PO needs yet   Plan: Continue TPN at 47m/hr Nutritional Components Amino acids (using 15% Clinisol): 106.1 grams Dextrose: 265.2 grams Lipids (using 20% SMOFlipids): 54.6 grams kCal: 1872/24h  Electrolytes in TPN (standard): Na 574m/L, K 5055mL, Ca 5mE65m, Mg 10 mEq/L , and Phos 10 mmol/L. Cl:Ac 1:1 Enteral multivitamin. Add trace elements to TPN Continue moderate SSI TIDM + CBG 4x/day (ACHS) + 15u insulin in TPN Monitor TPN labs on Mon/Thurs  Thank you for allowing pharmacy to be a part of this patient's care.  CaroGretel AcreharmD PGY1 Pharmacy Resident 05/13/2022 10:38 AM

## 2022-05-13 NOTE — Progress Notes (Signed)
[facility] [place] op day[facility]): 87 Days Post-Op.   Interval History:  Patient seen and examined Windowless [name] working well  No abdominal pain; [name], Debra [name], Debra [name]36.7 C)-98.6 F (37 C)] 98.1 F (36.7 C) (09/05 0547) Pulse Rate:  [73-89] 73 (09/05 0547) Resp:  [16-20] 16 (09/05 0547) BP: (100-108)/(50-58) 100/55 (09/05 0547) SpO2:  [97 %-100 %] 97 % (09/05 0547) Weight:  [95.4 kg] 95.4 kg (09/05 0500)     Height: '4\' 11"'$  (149.9 cm) Weight: 95.4 kg BMI (Calculated): 43.04   Intake/Output last 2 shifts:  09/04 0701 - 09/05 0700 In: 781.8 [I.V.:781.8] Out: 450 [Drains:450]   Physical Exam:  Constitutional: alert, cooperative and no distress  Respiratory: breathing non-labored at rest  Cardiovascular: regular rate and sinus rhythm  Gastrointestinal: Soft, abdominal soreness on the right, non-distended, no rebound/guarding. Integumentary: Midline wound open, peritoneum closed; granulating;  there are three stomatized areas visible in the LUQ and LLQ portions of the wound, output remains bilious; red rubbed in dependent portion of [name]; no leaks MSK: RUE edema improved; non-tender, peripheral pulses 2+   Labs:     Latest Ref Rng & Units 05/12/2022    9:44 AM 05/10/2022    5:18 AM 05/08/2022   10:58 AM  CBC  WBC 4.0 - 10.5 K/uL 3.8  4.4  4.0   Hemoglobin 12.0 - 15.0 g/dL 7.9  8.1  8.0   Hematocrit 36.0 - 46.0 % 25.5  26.5  26.1   Platelets 150 - 400 K/uL 87  90  100       Latest Ref Rng & Units 05/12/2022    7:50 AM 05/08/2022   10:58 AM 05/05/2022    5:43 AM  CMP  Glucose 70 - 99 mg/dL 135  110  123   BUN 6 - 20 mg/dL '20  20  21   '$ Creatinine 0.44 - 1.00 mg/dL 0.43  0.41  0.44   Sodium 135 - 145 mmol/L 134  135  134   Potassium 3.5 -  5.1 mmol/L 4.5  4.2  4.1   Chloride 98 - 111 mmol/L 105  105  104   CO2 22 - 32 mmol/L '25  26  25   '$ Calcium 8.9 - 10.3 mg/dL 8.3  8.3  8.1   Total Protein 6.5 - 8.1 g/dL 6.4  6.6  6.4   Total Bilirubin 0.3 - 1.2 mg/dL 0.6  0.4  0.4   Alkaline Phos 38 - 126 U/L 109  103  102   AST 15 - 41 U/L '26  25  23   '$ ALT 0 - 44 U/L '19  18  15      '$ Imaging studies:  No new imaging studies   Assessment/Plan: 56 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  - New 09/05: Current system not leaking; no new issues   - Wound Care: [name] pouch in place; now without red rubber to reduce pressure on wound. This is too foley bag with intermittent suction. There is some issues with pooling in  the dependent portion of the wound/pouch. Continue to return to suction intermittently. These pouches need changed every 7-10 days (ideally). Changed 09/01; would extend life of current pouch system as long as feasible given limited supply of these  - On Soft diet + nutritional supplementation - Continue TPN at goal; not meeting PO needs yet   - Monitor abdominal examination; on-going bowel function  - Pain control prn; antemetic prn  - Out of bed; discharged from therapies; no recommendations at this time   - Discharge Planning: Anticipate lengthy admission and potential eventual transfer   All of the above findings and recommendations were discussed with the patient, and the medical team, and all of patient's questions were answered to her expressed satisfaction.  -- [name], PA-C [facility] 05/13/2022, 7:17 AM M-F: 7am - 4pm

## 2022-05-13 NOTE — Progress Notes (Signed)
Nutrition Follow-up  DOCUMENTATION CODES:   Obesity unspecified  INTERVENTION:   Continue TPN per pharmacy- provides 1872kcal/day and 106g/day protein   Ensure Enlive po TID, each supplement provides 350 kcal and 20 grams of protein.  Daily weights   Check manganese, selenium, copper, zinc and chromium labs   NUTRITION DIAGNOSIS:   Increased nutrient needs related to wound healing, catabolic illness as evidenced by estimated needs.  GOAL:   Patient will meet greater than or equal to 90% of their needs -met with TPN   MONITOR:   PO intake, Supplement acceptance, Labs, Weight trends, Diet advancement, I & O's, TPN  ASSESSMENT:   56 y/o female with h/o hypothyroidism, COVID 19 (3/21), kidney stones and stage 3 colon cancer (s/p left hemicolectomy 5/21 and chemotherapy) who is admitted for new pelvic mass now s/p laparotomy 6/8 (with excision of pelvic mass from greater omentum, abdominal wall reconstruction with bilateral myocutaneous flaps and mesh, incisional hernia repair, appendectomy repair and VAC placement) complicated by bowel perforation s/p reopening of recent laparotomy 6/10 (with repair of small bowel perforation, excision of mesh, placement of two phasix mesh and VAC placement). Pathology returned as metastatic adenocarcinoma.   Pt tolerating TPN at goal rate. Refeed labs stable. Triglycerides wnl. Hyperglycemia resolved. Insulin remains in TPN. Pt remains on a soft diet. Pt reports intermittent nausea but this is improved today. Pt eating about 25% of meals. Pt is also drinking some Ensure. Per chart, pt appears weight stable since admission. [name] pouch  with 451m output. Will send out trace element labs to ensure adequate levels as pt on chronic TPN.    Medications reviewed and include: lovenox, insulin, L-glutamine, synthroid, imodium, MVI, protonix, carafate  Labs reviewed: Na 134(L), K 4.5 wnl, creat 0.43(L), P 4.1 wnl, Mg 1.7 wnl, alk phos 109 wnl, AST 26 wnl,  ALT 19 wnl, tbili 0.6 wnl- 9/4 Triglycerides- 57- 8/28 Iron 26(L), TIBC 328, ferritin 32- 8/24 Wbc- 3.8(L), Hgb 7.9(L), Hct 25.5(L), MCV 78.5(L), MCH 24.3(L)- 9/4 Cbgs-  77, 132 x 24 hrs  Diet Order:    Diet Order             DIET SOFT Room service appropriate? Yes; Fluid consistency: Thin  Diet effective now                  EDUCATION NEEDS:   Not appropriate for education at this time  Skin:  Skin Assessment: Reviewed RN Assessment ( Midline wound: 15cm x 10cm x 6cm )  Last BM:  9/5  Height:   Ht Readings from Last 1 Encounters:  03/01/22 '4\' 11"'  (1.499 m)    Weight:   Wt Readings from Last 1 Encounters:  05/13/22 95.4 kg    Ideal Body Weight:  44.3 kg  BMI:  Body mass index is 42.48 kg/m.  Estimated Nutritional Needs:   Kcal:  1800-2100kcal/day  Protein:  90-110g/d  Fluid:  1.4-1.6L/day  CKoleen DistanceMS, RD, LDN Please refer to [facility] Centerfor RD and/or RD on-call/weekend/after hours pager

## 2022-05-13 NOTE — Consult Note (Signed)
[facility] Nurse wound follow up Wound type:EC fistula  Pouch intact. [place] nursing team will continue to monitor often. Supplies in room (with pattern). Continue to collaborate with Surgery on pouch changes.  [facility] nursing team will follow, and will remain available to this patient, the nursing, surgical, and medical teams.    Thank you for inviting [name] to participate in this patient's Plan of Care.  Maudie Flakes, MSN, RN, CNS, [place], Serita Grammes, [employer], [employer] phone:  [phone]

## 2022-05-14 LAB — GLUCOSE, CAPILLARY
Glucose-Capillary: 143 mg/dL — ABNORMAL HIGH (ref 70–99)
Glucose-Capillary: 147 mg/dL — ABNORMAL HIGH (ref 70–99)
Glucose-Capillary: 104 mg/dL — ABNORMAL HIGH (ref 70–99)
Glucose-Capillary: 161 mg/dL — ABNORMAL HIGH (ref 70–99)

## 2022-05-14 MED ORDER — STERILE WATER FOR INJECTION IV SOLN
INTRAVENOUS | Status: AC
  Filled 2022-05-14: qty 707.2

## 2022-05-14 NOTE — Progress Notes (Signed)
PHARMACY - TOTAL PARENTERAL NUTRITION CONSULT NOTE   Indication: Prolonged ileus  Patient Measurements: Height: '4\' 11"'$  (149.9 cm) Weight: 95.8 kg (211 lb 3.2 oz) IBW/kg (Calculated) : 43.2 TPN AdjBW (KG): 55.2 Body mass index is 42.66 kg/m.  Assessment: Debra [name] is a 56 y.o. female s/p laparotomy, excision of greater omental mass, abdominal wall reconstruction with [name], appendectomy, and placement of Prevena vac.  Glucose / Insulin: No apparent history of diabetes. BG 77-138. 4 units of SSI moderate over last 24 hrs + 15 units insulin in TPN bag Electrolytes: 9/4: Na stable 134, Mag 1.7, Ca 8.3, alb 2.7, Phos 4.1, K 4.5 Renal: Scr 0.43 (baseline 0.3-0.6) Hepatic: No transaminitis. LFTs within normal limits. TG within normal limits  GI Imaging: No new imaging GI Surgeries / Procedures: s/p laparotomy, excision of greater omental mass, abdominal wall reconstruction with [name], appendectomy, and placement of Prevena vac  Central access: 02/15/22 TPN start date: 02/15/22  Nutritional Goals: Goal TPN rate of 65 mL/hr (provides 106 g of protein and 1872 kcals per day)  RD Assessment:  Estimated Needs Total Energy Estimated Needs: 1800-2100kcal/day Total Protein Estimated Needs: 90-110g/d Total Fluid Estimated Needs: 1.4-1.6L/day  Current Nutrition:  Soft diet + nutritional supplements, not meeting PO needs yet   Plan: Continue TPN at 82m/hr Nutritional Components Amino acids (using 15% Clinisol): 106.1 grams Dextrose: 265.2 grams Lipids (using 20% SMOFlipids): 54.6 grams kCal: 1872/24h  Electrolytes in TPN (standard): Na 521m/L, K 5025mL, Ca 5mE22m, Mg 10 mEq/L , and Phos 10 mmol/L. Cl:Ac 1:1 Enteral multivitamin. Hold trace elements per secure chat with RD Continue moderate SSI TIDM + CBG 4x/day (ACHS) + 15u insulin in TPN Monitor TPN labs on Mon/Thurs  Thank you for allowing pharmacy to be a part of this patient's  care.  CaroGretel AcrearmD PGY1 Pharmacy Resident 05/14/2022 10:19 AM

## 2022-05-14 NOTE — Plan of Care (Signed)
  Problem: Clinical Measurements: Goal: Will remain free from infection Outcome: Progressing   Problem: Nutrition: Goal: Adequate nutrition will be maintained Outcome: Progressing   Problem: Activity: Goal: Risk for activity intolerance will decrease Outcome: Progressing   Problem: Nutrition: Goal: Adequate nutrition will be maintained Outcome: Progressing   Problem: Coping: Goal: Level of anxiety will decrease Outcome: Progressing   

## 2022-05-14 NOTE — Progress Notes (Signed)
[facility]): [place] op day(s): 88 Days Post-Op.   Interval History:  Patient seen and examined Windowless [name] working well  No abdominal pain; nausea, emesis, fevers No new labs [name] Pouch output 900 ccs in last 24 hours She is on soft; on TPN Ambulating independently   Vital signs in last 24 hours: [min-max] current  Temp:  [98 F (36.7 C)-98.3 F (36.8 C)] 98.3 F (36.8 C) (09/06 0408) Pulse Rate:  [72-80] 80 (09/06 0408) Resp:  [14-18] 16 (09/06 0408) BP: (104-110)/(56-66) 104/58 (09/06 0408) SpO2:  [94 %-100 %] 98 % (09/06 0408) Weight:  [95.8 kg] 95.8 kg (09/06 0411)     Height: '4\' 11"'$  (149.9 cm) Weight: 95.8 kg BMI (Calculated): 43.04   Intake/Output last 2 shifts:  09/05 0701 - 09/06 0700 In: 863.4 [I.V.:863.4] Out: 900 [Drains:900]   Physical Exam:  Constitutional: alert, cooperative and no distress  Respiratory: breathing non-labored at rest  Cardiovascular: regular rate and sinus rhythm  Gastrointestinal: Soft, abdominal soreness on the right, non-distended, no rebound/guarding. Integumentary: Midline wound open, peritoneum closed; granulating;  there are three stomatized areas visible in the LUQ and LLQ portions of the wound, output remains bilious; red rubbed in dependent portion of [name]; no leaks MSK: RUE edema improved; non-tender, peripheral pulses 2+   Labs:     Latest Ref Rng & Units 05/12/2022    9:44 AM 05/10/2022    5:18 AM 05/08/2022   10:58 AM  CBC  WBC 4.0 - 10.5 K/uL 3.8  4.4  4.0   Hemoglobin 12.0 - 15.0 g/dL 7.9  8.1  8.0   Hematocrit 36.0 - 46.0 % 25.5  26.5  26.1   Platelets 150 - 400 K/uL 87  90  100       Latest Ref Rng & Units 05/12/2022    7:50 AM 05/08/2022   10:58 AM 05/05/2022    5:43 AM  CMP  Glucose 70 - 99 mg/dL 135  110  123   BUN 6 - 20 mg/dL '20  20  21   '$ Creatinine 0.44 - 1.00 mg/dL 0.43  0.41  0.44   Sodium 135 - 145 mmol/L 134  135  134   Potassium 3.5 -  5.1 mmol/L 4.5  4.2  4.1   Chloride 98 - 111 mmol/L 105  105  104   CO2 22 - 32 mmol/L '25  26  25   '$ Calcium 8.9 - 10.3 mg/dL 8.3  8.3  8.1   Total Protein 6.5 - 8.1 g/dL 6.4  6.6  6.4   Total Bilirubin 0.3 - 1.2 mg/dL 0.6  0.4  0.4   Alkaline Phos 38 - 126 U/L 109  103  102   AST 15 - 41 U/L '26  25  23   '$ ALT 0 - 44 U/L '19  18  15      '$ Imaging studies:  No new imaging studies   Assessment/Plan: 55 y.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  - New 09/06: Current system not leaking; no new issues   - Wound Care: [name] pouch in place; now without red rubber to reduce pressure on wound. This is too foley bag with intermittent suction. There is some issues with pooling in  the dependent portion of the wound/pouch. Continue to return to suction intermittently. These pouches need changed every 7-10 days (ideally). Changed 09/01; would extend life of current pouch system as long as feasible given limited supply of these  - On Soft diet + nutritional supplementation - Continue TPN at goal; not meeting PO needs yet   - Monitor abdominal examination; on-going bowel function  - Pain control prn; antemetic prn  - Out of bed; discharged from therapies; no recommendations at this time   - Discharge Planning: Anticipate lengthy admission and potential eventual transfer   All of the above findings and recommendations were discussed with the patient, and the medical team, and all of patient's questions were answered to her expressed satisfaction.  -- [name], PA-C [facility] 05/14/2022, 7:02 AM M-F: 7am - 4pm

## 2022-05-15 LAB — CBC
HCT: 24.9 % — ABNORMAL LOW (ref 36.0–46.0)
Hemoglobin: 7.8 g/dL — ABNORMAL LOW (ref 12.0–15.0)
MCH: 24.5 pg — ABNORMAL LOW (ref 26.0–34.0)
MCHC: 31.3 g/dL (ref 30.0–36.0)
MCV: 78.3 fL — ABNORMAL LOW (ref 80.0–100.0)
Platelets: 84 10*3/uL — ABNORMAL LOW (ref 150–400)
RBC: 3.18 MIL/uL — ABNORMAL LOW (ref 3.87–5.11)
RDW: 17 % — ABNORMAL HIGH (ref 11.5–15.5)
WBC: 3.5 10*3/uL — ABNORMAL LOW (ref 4.0–10.5)
nRBC: 0 % (ref 0.0–0.2)

## 2022-05-15 LAB — COMPREHENSIVE METABOLIC PANEL
ALT: 18 U/L (ref 0–44)
AST: 25 U/L (ref 15–41)
Albumin: 2.7 g/dL — ABNORMAL LOW (ref 3.5–5.0)
Alkaline Phosphatase: 105 U/L (ref 38–126)
Anion gap: 5 (ref 5–15)
BUN: 18 mg/dL (ref 6–20)
CO2: 25 mmol/L (ref 22–32)
Calcium: 8.3 mg/dL — ABNORMAL LOW (ref 8.9–10.3)
Chloride: 105 mmol/L (ref 98–111)
Creatinine, Ser: 0.43 mg/dL — ABNORMAL LOW (ref 0.44–1.00)
GFR, Estimated: >60 mL/min (ref >60)
Glucose, Bld: 146 mg/dL — ABNORMAL HIGH (ref 70–99)
Potassium: 3.8 mmol/L (ref 3.5–5.1)
Sodium: 135 mmol/L (ref 135–145)
Total Bilirubin: 0.4 mg/dL (ref 0.3–1.2)
Total Protein: 6.6 g/dL (ref 6.5–8.1)

## 2022-05-15 LAB — GLUCOSE, CAPILLARY
Glucose-Capillary: 145 mg/dL — ABNORMAL HIGH (ref 70–99)
Glucose-Capillary: 138 mg/dL — ABNORMAL HIGH (ref 70–99)
Glucose-Capillary: 130 mg/dL — ABNORMAL HIGH (ref 70–99)
Glucose-Capillary: 127 mg/dL — ABNORMAL HIGH (ref 70–99)

## 2022-05-15 LAB — MAGNESIUM: Magnesium: 1.7 mg/dL (ref 1.7–2.4)

## 2022-05-15 LAB — PHOSPHORUS: Phosphorus: 3.5 mg/dL (ref 2.5–4.6)

## 2022-05-15 LAB — TRIGLYCERIDES: Triglycerides: 64 mg/dL (ref <150)

## 2022-05-15 MED ORDER — STERILE WATER FOR INJECTION IV SOLN
INTRAVENOUS | Status: AC
  Filled 2022-05-15: qty 707.2

## 2022-05-15 NOTE — Progress Notes (Signed)
[facility] Day(s): [place] op day(s): 89 Days Post-Op.   Interval History:  Patient seen and examined Windowless [name] working well  No abdominal pain; nausea, emesis, fevers Nutritional labs are reassuring this AM [name] Pouch output 300 ccs in last 24 hours + unmeasured She is on soft; on TPN Ambulating independently   Vital signs in last 24 hours: [min-max] current  Temp:  [98.3 F (36.8 C)-98.4 F (36.9 C)] 98.4 F (36.9 C) (09/07 0427) Pulse Rate:  [74-101] 81 (09/07 0427) Resp:  [18-20] 18 (09/07 0427) BP: (102-117)/(57-67) 102/62 (09/07 0427) SpO2:  [96 %-100 %] 96 % (09/07 0427) Weight:  [96 kg] 96 kg (09/07 0500)     Height: '4\' 11"'$  (149.9 cm) Weight: 96 kg BMI (Calculated): 43.04   Intake/Output last 2 shifts:  09/06 0701 - 09/07 0700 In: 812.5 [P.O.:118; I.V.:694.5] Out: 900 [Urine:600; Drains:300]   Physical Exam:  Constitutional: alert, cooperative and no distress  Respiratory: breathing non-labored at rest  Cardiovascular: regular rate and sinus rhythm  Gastrointestinal: Soft, abdominal soreness on the right, non-distended, no rebound/guarding. Integumentary: Midline wound open, peritoneum closed; granulating;  there are three stomatized areas visible in the LUQ and LLQ portions of the wound, output remains bilious; red rubbed in dependent portion of [name]; no leaks MSK: RUE edema improved; non-tender, peripheral pulses 2+   Labs:     Latest Ref Rng & Units 05/12/2022    9:44 AM 05/10/2022    5:18 AM 05/08/2022   10:58 AM  CBC  WBC 4.0 - 10.5 K/uL 3.8  4.4  4.0   Hemoglobin 12.0 - 15.0 g/dL 7.9  8.1  8.0   Hematocrit 36.0 - 46.0 % 25.5  26.5  26.1   Platelets 150 - 400 K/uL 87  90  100       Latest Ref Rng & Units 05/15/2022    4:53 AM 05/12/2022    7:50 AM 05/08/2022   10:58 AM  CMP  Glucose 70 - 99 mg/dL 146  135  110   BUN 6 - 20 mg/dL '18  20  20   '$ Creatinine 0.44 - 1.00 mg/dL 0.43  0.43  0.41    Sodium 135 - 145 mmol/L 135  134  135   Potassium 3.5 - 5.1 mmol/L 3.8  4.5  4.2   Chloride 98 - 111 mmol/L 105  105  105   CO2 22 - 32 mmol/L '25  25  26   '$ Calcium 8.9 - 10.3 mg/dL 8.3  8.3  8.3   Total Protein 6.5 - 8.1 g/dL 6.6  6.4  6.6   Total Bilirubin 0.3 - 1.2 mg/dL 0.4  0.6  0.4   Alkaline Phos 38 - 126 U/L 105  109  103   AST 15 - 41 U/L '25  26  25   '$ ALT 0 - 44 U/L '18  19  18      '$ Imaging studies:  No new imaging studies   Assessment/Plan: 56 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  - New 09/07: Current system not leaking; no new issues   - Wound Care: [name] pouch in place; now without red rubber to reduce pressure on wound. This is too foley bag with intermittent suction.  There is some issues with pooling in the dependent portion of the wound/pouch. Continue to return to suction intermittently. These pouches need changed every 7-10 days (ideally). Changed 09/01; would extend life of current pouch system as long as feasible given limited supply of these  - On Soft diet + nutritional supplementation - Continue TPN at goal; not meeting PO needs yet   - Monitor abdominal examination; on-going bowel function  - Pain control prn; antemetic prn  - Out of bed; discharged from therapies; no recommendations at this time   - Discharge Planning: Anticipate lengthy admission and potential eventual transfer   All of the above findings and recommendations were discussed with the patient, and the medical team, and all of patient's questions were answered to her expressed satisfaction.  -- [name], PA-C [facility] 05/15/2022, 7:16 AM M-F: 7am - 4pm

## 2022-05-15 NOTE — Plan of Care (Signed)
  Problem: Nutrition: Goal: Adequate nutrition will be maintained Outcome: Progressing   Problem: Coping: Goal: Level of anxiety will decrease Outcome: Progressing   Problem: Pain Managment: Goal: General experience of comfort will improve Outcome: Progressing   

## 2022-05-15 NOTE — Progress Notes (Signed)
PHARMACY - TOTAL PARENTERAL NUTRITION CONSULT NOTE   Indication: Prolonged ileus  Patient Measurements: Height: '4\' 11"'$  (149.9 cm) Weight: 96 kg (211 lb 10.3 oz) IBW/kg (Calculated) : 43.2 TPN AdjBW (KG): 55.2 Body mass index is 42.75 kg/m.  Assessment: Debra [name] is a 56 y.o. female s/p laparotomy, excision of greater omental mass, abdominal wall reconstruction with [name], appendectomy, and placement of Prevena vac.  Glucose / Insulin: No apparent history of diabetes. BG 145. 4 units of SSI moderate over last 24 hrs + 15 units insulin in TPN bag Electrolytes: Na 135, Mag 1.7, Ca 8.3, alb 2.7, Phos 3.5, K 3.8 Renal: Scr 0.43 (baseline 0.3-0.6) Hepatic: No transaminitis. LFTs within normal limits. TG within normal limits  GI Imaging: No new imaging GI Surgeries / Procedures: s/p laparotomy, excision of greater omental mass, abdominal wall reconstruction with [name], appendectomy, and placement of Prevena vac  Central access: 02/15/22 TPN start date: 02/15/22  Nutritional Goals: Goal TPN rate of 65 mL/hr (provides 106 g of protein and 1872 kcals per day)  RD Assessment:  Estimated Needs Total Energy Estimated Needs: 1800-2100kcal/day Total Protein Estimated Needs: 90-110g/d Total Fluid Estimated Needs: 1.4-1.6L/day  Current Nutrition:  Soft diet + nutritional supplements, not meeting PO needs yet   Plan: Continue TPN at 52m/hr Nutritional Components Amino acids (using 15% Clinisol): 106.1 grams Dextrose: 265.2 grams Lipids (using 20% SMOFlipids): 54.6 grams kCal: 1872/24h  Electrolytes in TPN (standard): Na 544m/L, K 5023mL, Ca 5mE78m, Mg 10 mEq/L , and Phos 10 mmol/L. Cl:Ac 1:1 Enteral multivitamin. Hold trace elements per secure chat with RD Continue moderate SSI TIDM + CBG 4x/day (ACHS) + 15u insulin in TPN Monitor TPN labs on Mon/Thurs  Thank you for allowing pharmacy to be a part of this patient's care.  KishEleonore ChiquitoharmD 05/15/2022 7:51 AM

## 2022-05-16 LAB — GLUCOSE, CAPILLARY
Glucose-Capillary: 144 mg/dL — ABNORMAL HIGH (ref 70–99)
Glucose-Capillary: 135 mg/dL — ABNORMAL HIGH (ref 70–99)
Glucose-Capillary: 117 mg/dL — ABNORMAL HIGH (ref 70–99)
Glucose-Capillary: 121 mg/dL — ABNORMAL HIGH (ref 70–99)

## 2022-05-16 LAB — MISC LABCORP TEST (SEND OUT): Labcorp test code: 81034

## 2022-05-16 MED ORDER — TRACE MINERALS CU-MN-SE-ZN 300-55-60-3000 MCG/ML IV SOLN
INTRAVENOUS | Status: AC
  Filled 2022-05-16: qty 707.2

## 2022-05-16 MED ORDER — STERILE WATER FOR INJECTION IV SOLN: INTRAVENOUS | Status: DC

## 2022-05-16 NOTE — Consult Note (Addendum)
[facility] Nurse wound follow up Current [name] pouch is intact with good seal to midline abd full thickness wound with fistula locations. Currently attached to intermittent wall suction with mod amt tan drainage.  Last changed on 9/1, according to progress notes. Supplies in the room and instructions have been provided for bedside nurses to change PRN if leaking.  [facility] team will reassess on Mon to determine when a pouch change is indicated. Thank-you,  Julien Girt MSN, [place], [place], [place], [place]

## 2022-05-16 NOTE — Progress Notes (Addendum)
PHARMACY - TOTAL PARENTERAL NUTRITION CONSULT NOTE   Indication: Prolonged ileus  Patient Measurements: Height: '4\' 11"'$  (149.9 cm) Weight: 96 kg (211 lb 10.3 oz) IBW/kg (Calculated) : 43.2 TPN AdjBW (KG): 55.2 Body mass index is 42.75 kg/m.  Assessment: Debra [name] is a 56 y.o. female s/p laparotomy, excision of greater omental mass, abdominal wall reconstruction with [name], appendectomy, and placement of Prevena vac.  Glucose / Insulin: No apparent history of diabetes. BG 127-144. 6 units of SSI moderate over last 24 hrs + 15 units insulin in TPN bag Electrolytes: Na 135, Mag 1.7, Ca 8.3, alb 2.7, Phos 3.5, K 3.8 Renal: Scr 0.43 (baseline 0.3-0.6) Hepatic: No transaminitis. LFTs within normal limits. TG within normal limits  GI Imaging: No new imaging GI Surgeries / Procedures: s/p laparotomy, excision of greater omental mass, abdominal wall reconstruction with [name], appendectomy, and placement of Prevena vac  Central access: 02/15/22 TPN start date: 02/15/22  Nutritional Goals: Goal TPN rate of 65 mL/hr (provides 106 g of protein and 1872 kcals per day)  RD Assessment:  Estimated Needs Total Energy Estimated Needs: 1800-2100kcal/day Total Protein Estimated Needs: 90-110g/d Total Fluid Estimated Needs: 1.4-1.6L/day  Current Nutrition:  Soft diet + nutritional supplements, not meeting PO needs yet   Plan: Continue TPN at 56m/hr Nutritional Components Amino acids (using 15% Clinisol): 106.1 grams Dextrose: 265.2 grams Lipids (using 20% SMOFlipids): 54.6 grams kCal: 1872/24h  Electrolytes in TPN (standard): Na 537m/L, K 5044mL, Ca 5mE74m, Mg 10 mEq/L , and Phos 10 mmol/L. Cl:Ac 1:1 Enteral multivitamin. Resuming trace elements per secure chat with RD Continue moderate SSI TIDM + CBG 4x/day (ACHS) + 15u insulin in TPN Monitor TPN labs on Mon/Thurs  Thank you for allowing pharmacy to be a part of this patient's care.  CaroGretel AcrearmD PGY1 Pharmacy Resident 05/16/2022 9:29 AM

## 2022-05-16 NOTE — Progress Notes (Signed)
[facility] Hospital Day(s): 55.   Post op day(s): 90 Days Post-Op.   Interval History:  Patient seen and examined Windowless [name] working well  No abdominal pain; nausea, emesis, fevers No new labs this morning [name] Pouch output 1220 ccs in last 24 hours She is on soft; on TPN Ambulating independently   Vital signs in last 24 hours: [min-max] current  Temp:  [98.2 F (36.8 C)-98.5 F (36.9 C)] 98.2 F (36.8 C) (09/08 0340) Pulse Rate:  [77-92] 77 (09/08 0340) Resp:  [18-20] 18 (09/08 0340) BP: (101-126)/(58-84) 101/59 (09/08 0340) SpO2:  [96 %-97 %] 97 % (09/08 0340)     Height: '4\' 11"'$  (149.9 cm) Weight: 96 kg BMI (Calculated): 43.04   Intake/Output last 2 shifts:  09/07 0701 - 09/08 0700 In: 1253.1 [P.O.:475; I.V.:778.1] Out: 1220 [Drains:1220]   Physical Exam:  Constitutional: alert, cooperative and no distress  Respiratory: breathing non-labored at rest  Cardiovascular: regular rate and sinus rhythm  Gastrointestinal: Soft, abdominal soreness on the right, non-distended, no rebound/guarding. Integumentary: Midline wound open, peritoneum closed; granulating;  there are three stomatized areas visible in the LUQ and LLQ portions of the wound, output remains bilious; red rubbed in dependent portion of [name]; no leaks MSK: RUE edema improved; non-tender, peripheral pulses 2+   Labs:     Latest Ref Rng & Units 05/15/2022    4:53 AM 05/12/2022    9:44 AM 05/10/2022    5:18 AM  CBC  WBC 4.0 - 10.5 K/uL 3.5  3.8  4.4   Hemoglobin 12.0 - 15.0 g/dL 7.8  7.9  8.1   Hematocrit 36.0 - 46.0 % 24.9  25.5  26.5   Platelets 150 - 400 K/uL 84  87  90       Latest Ref Rng & Units 05/15/2022    4:53 AM 05/12/2022    7:50 AM 05/08/2022   10:58 AM  CMP  Glucose 70 - 99 mg/dL 146  135  110   BUN 6 - 20 mg/dL '18  20  20   '$ Creatinine 0.44 - 1.00 mg/dL 0.43  0.43  0.41   Sodium 135 - 145 mmol/L 135  134  135   Potassium 3.5 - 5.1 mmol/L 3.8  4.5   4.2   Chloride 98 - 111 mmol/L 105  105  105   CO2 22 - 32 mmol/L '25  25  26   '$ Calcium 8.9 - 10.3 mg/dL 8.3  8.3  8.3   Total Protein 6.5 - 8.1 g/dL 6.6  6.4  6.6   Total Bilirubin 0.3 - 1.2 mg/dL 0.4  0.6  0.4   Alkaline Phos 38 - 126 U/L 105  109  103   AST 15 - 41 U/L '25  26  25   '$ ALT 0 - 44 U/L '18  19  18      '$ Imaging studies:  No new imaging studies   Assessment/Plan: 55 y.o. female with high output enterocutaneous fistula 90 Days Post-Op s/p re-opening of laparotomy for repair of small bowel perforation following initial laparotomy, excision of greater omental mass, abdominal wall reconstruction with [name], appendectomy, and placement of Prevena vac on 06/08.  - New 09/08: Current system not leaking; no new issues. Plan for repeat CT Abdomen/Pelvis on 09/11 to delineate anatomy for planning/possible transfer    - Wound Care: [name] pouch in place; now without red rubber to reduce pressure on wound. This is too foley bag  with intermittent suction. There is some issues with pooling in the dependent portion of the wound/pouch. Continue to return to suction intermittently. These pouches need changed every 7-10 days (ideally). Changed 09/01; would extend life of current pouch system as long as feasible given limited supply of these  - On Soft diet + nutritional supplementation - Continue TPN at goal; not meeting PO needs yet   - Monitor abdominal examination; on-going bowel function  - Pain control prn; antemetic prn  - Out of bed; discharged from therapies; no recommendations at this time   - Discharge Planning: Anticipate lengthy admission and potential eventual transfer   All of the above findings and recommendations were discussed with the patient, and the medical team, and all of patient's questions were answered to her expressed satisfaction.  -- [name], PA-C [facility] 05/16/2022, 7:36 AM M-F: 7am - 4pm

## 2022-05-17 DIAGNOSIS — K632 Fistula of intestine: Secondary | ICD-10-CM

## 2022-05-17 DIAGNOSIS — C187 Malignant neoplasm of sigmoid colon: Secondary | ICD-10-CM

## 2022-05-17 LAB — GLUCOSE, CAPILLARY
Glucose-Capillary: 153 mg/dL — ABNORMAL HIGH (ref 70–99)
Glucose-Capillary: 143 mg/dL — ABNORMAL HIGH (ref 70–99)
Glucose-Capillary: 107 mg/dL — ABNORMAL HIGH (ref 70–99)
Glucose-Capillary: 142 mg/dL — ABNORMAL HIGH (ref 70–99)

## 2022-05-17 MED ORDER — TRACE MINERALS CU-MN-SE-ZN 300-55-60-3000 MCG/ML IV SOLN
INTRAVENOUS | Status: AC
  Filled 2022-05-17: qty 707.2

## 2022-05-17 NOTE — Progress Notes (Signed)
PHARMACY - TOTAL PARENTERAL NUTRITION CONSULT NOTE   Indication: Prolonged ileus  Patient Measurements: Height: '4\' 11"'$  (149.9 cm) Weight: 94.8 kg (208 lb 15.9 oz) IBW/kg (Calculated) : 43.2 TPN AdjBW (KG): 55.2 Body mass index is 42.21 kg/m.  Assessment: Debra [name] is a 55 y.o. female s/p laparotomy, excision of greater omental mass, abdominal wall reconstruction with [name], appendectomy, and placement of Prevena vac.  Glucose / Insulin: No apparent history of diabetes. BG 117-144. 4 units of SSI moderate over last 24 hrs + 15 units insulin in TPN bag Electrolytes: Na 135, Mag 1.7, Ca 8.3, alb 2.7, Phos 3.5, K 3.8 Renal: Scr 0.43 (baseline 0.3-0.6) Hepatic: No transaminitis. LFTs within normal limits. TG within normal limits  GI Imaging: No new imaging GI Surgeries / Procedures: s/p laparotomy, excision of greater omental mass, abdominal wall reconstruction with [name], appendectomy, and placement of Prevena vac  Central access: 02/15/22 TPN start date: 02/15/22  Nutritional Goals: Goal TPN rate of 65 mL/hr (provides 106 g of protein and 1872 kcals per day)  RD Assessment:  Estimated Needs Total Energy Estimated Needs: 1800-2100kcal/day Total Protein Estimated Needs: 90-110g/d Total Fluid Estimated Needs: 1.4-1.6L/day  Current Nutrition:  Soft diet + nutritional supplements, not meeting PO needs yet   Plan: Continue TPN at 38m/hr Nutritional Components Amino acids (using 15% Clinisol): 106.1 grams Dextrose: 265.2 grams Lipids (using 20% SMOFlipids): 54.6 grams kCal: 1872/24h  Electrolytes in TPN (standard): Na 572m/L, K 5073mL, Ca 5mE36m, Mg 10 mEq/L , and Phos 10 mmol/L. Cl:Ac 1:1 Enteral multivitamin. Resuming trace elements per secure chat with RD Continue moderate SSI TIDM + CBG 4x/day (ACHS) + 12u insulin in TPN Monitor TPN labs on Mon/Thurs  Thank you for allowing pharmacy to be a part of this patient's care.  WaliPearla DubonnetarmD Clinical Pharmacist 05/17/2022 8:52 AM

## 2022-05-17 NOTE — Progress Notes (Signed)
05/17/2022  Subjective: Patient is 91 Days Post-Op.  No acute events overnight.  Patient denies any abdominal pain, nausea, or emesis.  No fevers.  Is on a soft diet tolerating and on TPN for additional supplement.  [name] pouch with 500 mL out yesterday.  Vital signs: Temp:  [98.1 F (36.7 C)-98.5 F (36.9 C)] 98.5 F (36.9 C) (09/09 0737) Pulse Rate:  [83-93] 93 (09/09 0737) Resp:  [18-20] 18 (09/09 0737) BP: (101-108)/(52-74) 108/74 (09/09 0737) SpO2:  [98 %] 98 % (09/09 0737) Weight:  [94.8 kg] 94.8 kg (09/09 0447)   Intake/Output: 09/08 0701 - 09/09 0700 In: 523.3 [I.V.:523.3] Out: 500 [Drains:500] Last BM Date : 05/17/22  Physical Exam: Constitutional: No acute distress Abdomen: Soft, nondistended, with midline wound open with good granulation tissue at the wound base and 3 visible areas of fistula formation with stool/bilious output.  Labs:  Recent Labs    05/15/22 0453  WBC 3.5*  HGB 7.8*  HCT 24.9*  PLT 84*   Recent Labs    05/15/22 0453  NA 135  K 3.8  CL 105  CO2 25  GLUCOSE 146*  BUN 18  CREATININE 0.43*  CALCIUM 8.3*   No results for input(s): "LABPROT", "INR" in the last 72 hours.  Imaging: No results found.  Assessment/Plan: This is a 56 y.o. female  with high output enterocutaneous fistula 91 Days Post-Op s/p re-opening of laparotomy for repair of small bowel perforation following initial laparotomy, excision of greater omental mass, abdominal wall reconstruction with [name], appendectomy, and placement of Prevena vac on 06/08.  -For now continue with a soft diet and TPN for nutrition. - Continue [name] pouch. - Plan for repeat CT scan on 9/11 to evaluate her fistula anatomy better.   Melvyn Neth, [facility]

## 2022-05-18 DIAGNOSIS — C187 Malignant neoplasm of sigmoid colon: Secondary | ICD-10-CM

## 2022-05-18 DIAGNOSIS — K632 Fistula of intestine: Secondary | ICD-10-CM

## 2022-05-18 LAB — GLUCOSE, CAPILLARY
Glucose-Capillary: 137 mg/dL — ABNORMAL HIGH (ref 70–99)
Glucose-Capillary: 139 mg/dL — ABNORMAL HIGH (ref 70–99)
Glucose-Capillary: 116 mg/dL — ABNORMAL HIGH (ref 70–99)
Glucose-Capillary: 144 mg/dL — ABNORMAL HIGH (ref 70–99)

## 2022-05-18 MED ORDER — TRACE MINERALS CU-MN-SE-ZN 300-55-60-3000 MCG/ML IV SOLN
INTRAVENOUS | Status: AC
  Filled 2022-05-18: qty 707.2

## 2022-05-18 NOTE — Progress Notes (Signed)
05/18/2022  Subjective: Patient is 92 Days Post-Op.  No acute events overnight.  Reports a bit of nausea last night, but now doing ok.  No fevers.  Is on a soft diet tolerating and on TPN for additional supplement.   Vital signs: Temp:  [98 F (36.7 C)-98.6 F (37 C)] 98 F (36.7 C) (09/10 0812) Pulse Rate:  [69-85] 76 (09/10 0812) Resp:  [16-20] 20 (09/10 0812) BP: (90-122)/(51-77) 98/56 (09/10 0812) SpO2:  [96 %-100 %] 97 % (09/10 1552)   Intake/Output: 09/09 0701 - 09/10 0700 In: 511.9 [I.V.:511.9] Out: -  Last BM Date : 05/18/22  Physical Exam: Constitutional: No acute distress Abdomen: Soft, nondistended, with midline wound open with good granulation tissue at the wound base and 3 visible areas of fistula formation with stool/bilious output.  Labs:  No results for input(s): "WBC", "HGB", "HCT", "PLT" in the last 72 hours.  No results for input(s): "NA", "K", "CL", "CO2", "GLUCOSE", "BUN", "CREATININE", "CALCIUM" in the last 72 hours.  Invalid input(s): "MAGNESIUM"  No results for input(s): "LABPROT", "INR" in the last 72 hours.  Imaging: No results found.  Assessment/Plan: This is a 56 y.o. female  with high output enterocutaneous fistula 92 Days Post-Op s/p re-opening of laparotomy for repair of small bowel perforation following initial laparotomy, excision of greater omental mass, abdominal wall reconstruction with [name], appendectomy, and placement of Prevena vac on 06/08.  - For now continue with a soft diet and TPN for nutrition. - Continue [name] pouch. - Plan for repeat CT scan on 9/11 to evaluate her fistula anatomy better. -- OOB, ambulate   Melvyn Neth, MD [facility]

## 2022-05-18 NOTE — Progress Notes (Signed)
PHARMACY - TOTAL PARENTERAL NUTRITION CONSULT NOTE   Indication: Prolonged ileus  Patient Measurements: Height: '4\' 11"'$  (149.9 cm) Weight: 94.8 kg (208 lb 15.9 oz) IBW/kg (Calculated) : 43.2 TPN AdjBW (KG): 55.2 Body mass index is 42.21 kg/m.  Assessment: Debra [name] is a 56 y.o. female s/p laparotomy, excision of greater omental mass, abdominal wall reconstruction with [name], appendectomy, and placement of Prevena vac.  Glucose / Insulin: No apparent history of diabetes. BG 121-143. 5 units of SSI moderate over last 24 hrs + 15 units insulin in TPN bag Electrolytes: Na 135, Mag 1.7, Ca 8.3, alb 2.7, Phos 3.5, K 3.8 Renal: Scr 0.43 (baseline 0.3-0.6) Hepatic: No transaminitis. LFTs within normal limits. TG within normal limits  GI Imaging: No new imaging GI Surgeries / Procedures: s/p laparotomy, excision of greater omental mass, abdominal wall reconstruction with [name], appendectomy, and placement of Prevena vac  Central access: 02/15/22 TPN start date: 02/15/22  Nutritional Goals: Goal TPN rate of 65 mL/hr (provides 106 g of protein and 1872 kcals per day)  RD Assessment:  Estimated Needs Total Energy Estimated Needs: 1800-2100kcal/day Total Protein Estimated Needs: 90-110g/d Total Fluid Estimated Needs: 1.4-1.6L/day  Current Nutrition:  Soft diet + nutritional supplements, not meeting PO needs yet   Plan: Continue TPN at 73m/hr Nutritional Components Amino acids (using 15% Clinisol): 106.1 grams Dextrose: 265.2 grams Lipids (using 20% SMOFlipids): 54.6 grams kCal: 1872/24h  Electrolytes in TPN (standard): Na 566m/L, K 506mL, Ca 5mE19m, Mg 10 mEq/L , and Phos 10 mmol/L. Cl:Ac 1:1 Enteral multivitamin. Resuming trace elements per secure chat with RD Continue moderate SSI TIDM + CBG 4x/day (ACHS) + 12u insulin in TPN Monitor TPN labs on Mon/Thurs  Thank you for allowing pharmacy to be a part of this patient's care.  WaliPearla DubonnetarmD Clinical Pharmacist 05/18/2022 8:36 AM

## 2022-05-19 ENCOUNTER — Inpatient Hospital Stay: Payer: Medicaid Other

## 2022-05-19 ENCOUNTER — Encounter: Payer: Self-pay | Admitting: Surgery

## 2022-05-19 LAB — GLUCOSE, CAPILLARY
Glucose-Capillary: 134 mg/dL — ABNORMAL HIGH (ref 70–99)
Glucose-Capillary: 95 mg/dL (ref 70–99)
Glucose-Capillary: 127 mg/dL — ABNORMAL HIGH (ref 70–99)
Glucose-Capillary: 120 mg/dL — ABNORMAL HIGH (ref 70–99)

## 2022-05-19 LAB — MAGNESIUM: Magnesium: 1.8 mg/dL (ref 1.7–2.4)

## 2022-05-19 LAB — COMPREHENSIVE METABOLIC PANEL
ALT: 17 U/L (ref 0–44)
AST: 24 U/L (ref 15–41)
Albumin: 2.5 g/dL — ABNORMAL LOW (ref 3.5–5.0)
Alkaline Phosphatase: 98 U/L (ref 38–126)
Anion gap: 3 — ABNORMAL LOW (ref 5–15)
BUN: 18 mg/dL (ref 6–20)
CO2: 25 mmol/L (ref 22–32)
Calcium: 8.2 mg/dL — ABNORMAL LOW (ref 8.9–10.3)
Chloride: 108 mmol/L (ref 98–111)
Creatinine, Ser: 0.41 mg/dL — ABNORMAL LOW (ref 0.44–1.00)
GFR, Estimated: >60 mL/min (ref >60)
Glucose, Bld: 146 mg/dL — ABNORMAL HIGH (ref 70–99)
Potassium: 3.8 mmol/L (ref 3.5–5.1)
Sodium: 136 mmol/L (ref 135–145)
Total Bilirubin: 0.3 mg/dL (ref 0.3–1.2)
Total Protein: 6.2 g/dL — ABNORMAL LOW (ref 6.5–8.1)

## 2022-05-19 LAB — TRIGLYCERIDES: Triglycerides: 70 mg/dL (ref <150)

## 2022-05-19 LAB — MISC LABCORP TEST (SEND OUT): Labcorp test code: 71522

## 2022-05-19 LAB — PHOSPHORUS: Phosphorus: 3.7 mg/dL (ref 2.5–4.6)

## 2022-05-19 MED ORDER — IOHEXOL 300 MG/ML  SOLN
100.0000 mL | Freq: Once | INTRAMUSCULAR | Status: AC | PRN
Start: 2022-05-19 — End: 2022-05-19
  Administered 2022-05-19: 100 mL via INTRAVENOUS

## 2022-05-19 MED ORDER — IOHEXOL 9 MG/ML PO SOLN
500.0000 mL | ORAL | Status: AC
  Administered 2022-05-19: 500 mL via ORAL

## 2022-05-19 MED ORDER — TRACE MINERALS CU-MN-SE-ZN 300-55-60-3000 MCG/ML IV SOLN
INTRAVENOUS | Status: AC
  Filled 2022-05-19: qty 707.2

## 2022-05-19 NOTE — TOC Progression Note (Signed)
Transition of Care [facility]) - Progression Note    Patient Details  Name: Debra [name] MRN: [id number] Date of Birth: March 13, 1966  [facility] of [facility]) CM/SW [place], LCSW Phone Number: 05/19/2022, 11:20 AM  Clinical Narrative:   TOC continues to follow for disposition needs.  Expected Discharge Plan: [place] Barriers to Discharge: Continued Medical Work up  Expected Discharge Plan and Services Expected Discharge Plan: [place]                                               Social Determinants of Health (SDOH) Interventions    Readmission Risk Interventions     No data to display

## 2022-05-19 NOTE — Consult Note (Signed)
[place] Nurse wound follow up Patient receiving care in [facility] 221.  Windowless [name] pouch in place, connected to wall suction, no signs of impending leakage. PA-C Z. Delena Bali requesting to wait until later in the week to change the [name] pouch.  Spare pouch and pattern in room.  Val Riles, RN, MSN, CWOCN, CNS-BC, pager [phone]

## 2022-05-19 NOTE — Progress Notes (Signed)
[facility] Day(s): [place] op day(s): 93 Days Post-Op.   Interval History:  Patient seen and examined No issues over the weekend [facility] working well  No abdominal pain; nausea, emesis, fevers Nutritional labs reassuring this morning [name] Pouch output 500 ccs + unmeasured in last 24 hours She is on soft; on TPN Ambulating independently  Plan for repeat CT Abdomen/Pelvis to delineate fistula anatomy  Vital signs in last 24 hours: [min-max] current  Temp:  [97.9 F (36.6 C)-98.5 F (36.9 C)] 98.5 F (36.9 C) (09/11 0511) Pulse Rate:  [76-80] 78 (09/11 0511) Resp:  [16-20] 16 (09/11 0511) BP: (95-106)/(56-62) 106/62 (09/11 0511) SpO2:  [96 %-99 %] 96 % (09/11 0511) Weight:  [95.9 kg] 95.9 kg (09/11 0500)     Height: '4\' 11"'$  (149.9 cm) Weight: 95.9 kg BMI (Calculated): 43.04   Intake/Output last 2 shifts:  09/10 0701 - 09/11 0700 In: 646.9 [I.V.:646.9] Out: 500 [Drains:500]   Physical Exam:  Constitutional: alert, cooperative and no distress  Respiratory: breathing non-labored at rest  Cardiovascular: regular rate and sinus rhythm  Gastrointestinal: Soft, abdominal soreness on the right, non-distended, no rebound/guarding. Integumentary: Midline wound open, peritoneum closed; granulating;  there are three stomatized areas visible in the LUQ and LLQ portions of the wound, output remains bilious; red rubbed in dependent portion of [name]; no leaks MSK: RUE edema improved; non-tender, peripheral pulses 2+   Labs:     Latest Ref Rng & Units 05/15/2022    4:53 AM 05/12/2022    9:44 AM 05/10/2022    5:18 AM  CBC  WBC 4.0 - 10.5 K/uL 3.5  3.8  4.4   Hemoglobin 12.0 - 15.0 g/dL 7.8  7.9  8.1   Hematocrit 36.0 - 46.0 % 24.9  25.5  26.5   Platelets 150 - 400 K/uL 84  87  90       Latest Ref Rng & Units 05/19/2022    5:14 AM 05/15/2022    4:53 AM 05/12/2022    7:50 AM  CMP  Glucose 70 - 99 mg/dL 146  146  135   BUN 6 -  20 mg/dL '18  18  20   '$ Creatinine 0.44 - 1.00 mg/dL 0.41  0.43  0.43   Sodium 135 - 145 mmol/L 136  135  134   Potassium 3.5 - 5.1 mmol/L 3.8  3.8  4.5   Chloride 98 - 111 mmol/L 108  105  105   CO2 22 - 32 mmol/L '25  25  25   '$ Calcium 8.9 - 10.3 mg/dL 8.2  8.3  8.3   Total Protein 6.5 - 8.1 g/dL 6.2  6.6  6.4   Total Bilirubin 0.3 - 1.2 mg/dL 0.3  0.4  0.6   Alkaline Phos 38 - 126 U/L 98  105  109   AST 15 - 41 U/L '24  25  26   '$ ALT 0 - 44 U/L '17  18  19      '$ Imaging studies:  No new imaging studies   Assessment/Plan: 56 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  - New 09/11: Current system not leaking; no new issues. Plan for repeat CT Abdomen/Pelvis today to delineate anatomy for planning/possible transfer    -  Wound Care: [name] pouch in place; now without red rubber to reduce pressure on wound. This is too foley bag with intermittent suction. There is some issues with pooling in the dependent portion of the wound/pouch. Continue to return to suction intermittently. These pouches need changed every 7-10 days (ideally). Changed 09/01; would extend life of current pouch system as long as feasible given limited supply of these  - On Soft diet + nutritional supplementation - Continue TPN at goal; not meeting PO needs yet   - Monitor abdominal examination; on-going bowel function  - Pain control prn; antemetic prn  - Out of bed; discharged from therapies; no recommendations at this time   - Discharge Planning: Anticipate lengthy admission and potential eventual transfer   All of the above findings and recommendations were discussed with the patient, and the medical team, and all of patient's questions were answered to her  expressed satisfaction.  -- [name], PA-C [facility] 05/19/2022, 7:35 AM M-F: 7am - 4pm

## 2022-05-20 LAB — GLUCOSE, CAPILLARY
Glucose-Capillary: 136 mg/dL — ABNORMAL HIGH (ref 70–99)
Glucose-Capillary: 128 mg/dL — ABNORMAL HIGH (ref 70–99)
Glucose-Capillary: 120 mg/dL — ABNORMAL HIGH (ref 70–99)
Glucose-Capillary: 114 mg/dL — ABNORMAL HIGH (ref 70–99)

## 2022-05-20 LAB — COPPER, SERUM: Copper: 124 ug/dL (ref 80–158)

## 2022-05-20 LAB — ZINC: Zinc: 38 ug/dL — ABNORMAL LOW (ref 44–115)

## 2022-05-20 MED ORDER — TRACE MINERALS CU-MN-SE-ZN 300-55-60-3000 MCG/ML IV SOLN
INTRAVENOUS | Status: AC
  Filled 2022-05-20: qty 707.2

## 2022-05-20 NOTE — Progress Notes (Signed)
Nutrition Follow-up  DOCUMENTATION CODES:   Obesity unspecified  INTERVENTION:   Continue TPN per pharmacy- provides 1872kcal/day and 106g/day protein   Ensure Enlive po TID, each supplement provides 350 kcal and 20 grams of protein.  MVI po daily   Daily weights   Manganese, copper and zinc labs pending.   NUTRITION DIAGNOSIS:   Increased nutrient needs related to wound healing, catabolic illness as evidenced by estimated needs.  GOAL:   Patient will meet greater than or equal to 90% of their needs -met with TPN   MONITOR:   PO intake, Supplement acceptance, Labs, Weight trends, Diet advancement, I & O's, TPN  ASSESSMENT:   56 y/o female with h/o hypothyroidism, COVID 19 (3/21), kidney stones and stage 3 colon cancer (s/p left hemicolectomy 5/21 and chemotherapy) who is admitted for new pelvic mass now s/p laparotomy 6/8 (with excision of pelvic mass from greater omentum, abdominal wall reconstruction with bilateral myocutaneous flaps and mesh, incisional hernia repair, appendectomy repair and VAC placement) complicated by bowel perforation s/p reopening of recent laparotomy 6/10 (with repair of small bowel perforation, excision of mesh, placement of two phasix mesh and VAC placement). Pathology returned as metastatic adenocarcinoma.   Pt tolerating TPN at goal rate. Refeed labs stable. Triglycerides wnl. Hyperglycemia resolved. Insulin remains in TPN. Pt remains on a soft diet; pt eating about 25-75% of meals. Pt is also drinking some Ensure. Per chart, pt appears weight stable since admission. [name] pouch  with 1083m output. Trace element labs pending. Chromium returned slightly elevated; RD suspects this is r/t chromium not being removed from TPN prior to drawing the lab sample. Pt had CT scan 9/11; exact site of fistula unable to be determined. Per MD note, pt may require transfer to tertiary care facility vs home TPN.    Medications reviewed and include: lovenox, heparin,  insulin, L-glutamine, synthroid, imodium, MVI, protonix, carafate  -Selenium 220 ug/L wnl- 9/7 -Chromium 2.8 ug/L (H)- 9/7  Labs reviewed: Na 136 wnl, K 3.8 wnl, creat 0.41(L), P 3.7 wnl, Mg 1.8 wnl, alk phos 98 wnl, AST 24 wnl, ALT 17 wnl, tbili 0.3 wnl- 9/11 Triglycerides- 70- 9/11 Iron 26(L), TIBC 328, ferritin 32- 8/24 Wbc- 3.5(L), Hgb 7.8(L), Hct 24.9(L), MCV 78.3(L), MCH 24.5(L)- 9/7 Cbgs-  128, 136 x 24 hrs  Diet Order:    Diet Order             DIET SOFT Room service appropriate? Yes; Fluid consistency: Thin  Diet effective now                  EDUCATION NEEDS:   Not appropriate for education at this time  Skin:  Skin Assessment: Reviewed RN Assessment ( Midline wound: 15cm x 10cm x 6cm )  Last BM:  9/12  Height:   Ht Readings from Last 1 Encounters:  03/01/22 '4\' 11"'  (1.499 m)    Weight:   Wt Readings from Last 1 Encounters:  05/19/22 95.9 kg    Ideal Body Weight:  44.3 kg  BMI:  Body mass index is 42.7 kg/m.  Estimated Nutritional Needs:   Kcal:  1800-2100kcal/day  Protein:  90-110g/d  Fluid:  1.4-1.6L/day  CKoleen DistanceMS, RD, LDN Please refer to [facility] and/or RD on-call/weekend/after hours pager

## 2022-05-20 NOTE — Progress Notes (Signed)
PHARMACY - TOTAL PARENTERAL NUTRITION CONSULT NOTE   Indication: Prolonged ileus  Patient Measurements: Height: '4\' 11"'$  (149.9 cm) Weight: 95.9 kg (211 lb 6.7 oz) IBW/kg (Calculated) : 43.2 TPN AdjBW (KG): 55.2 Body mass index is 42.7 kg/m.  Assessment: Debra [name] is a 56 y.o. female s/p laparotomy, excision of greater omental mass, abdominal wall reconstruction with [name], appendectomy, and placement of Prevena vac.  Glucose / Insulin: No apparent history of diabetes. BG 95-136 ,4 units of SSI moderate over last 24 hrs + 15 units insulin in TPN bag Electrolytes: Na 136, Mag 1.8, Ca 8.2, alb 2.5, Phos 3.7, K 3.8 Renal: Scr 0.41 (baseline 0.3-0.6) Hepatic: No transaminitis. LFTs within normal limits. TG within normal limits  GI Imaging: 9/11 CTAP: no new acute issues GI Surgeries / Procedures: s/p laparotomy, excision of greater omental mass, abdominal wall reconstruction with [name], appendectomy, and placement of Prevena vac  Central access: 02/15/22 TPN start date: 02/15/22  Nutritional Goals: Goal TPN rate of 65 mL/hr (provides 106 g of protein and 1872 kcals per day)  RD Assessment:  Estimated Needs Total Energy Estimated Needs: 1800-2100kcal/day Total Protein Estimated Needs: 90-110g/d Total Fluid Estimated Needs: 1.4-1.6L/day  Current Nutrition:  Soft diet + nutritional supplements, not meeting PO needs yet   Plan: Continue TPN at 65 mL/hr Nutritional Components Amino acids (using 15% Clinisol): 106.1 grams Dextrose: 265.2 grams Lipids (using 20% SMOFlipids): 54.6 grams kCal: 1872/24h  Electrolytes in TPN (standard): Na 6mq/L, K 5537m/L, Ca 37m83mL, Mg 10 mEq/L , and Phos 10 mmol/L. Cl:Ac 1:1 Enteral multivitamin. Resuming trace elements per secure chat with RD Continue moderate SSI TIDM + CBG 4x/day (ACHS) + 12u insulin in TPN Monitor TPN labs on Mon/Thurs  Thank you for allowing pharmacy to be a part of this patient's  care.  CarGretel AcreharmD PGY1 Pharmacy Resident 05/20/2022 10:57 AM

## 2022-05-20 NOTE — Progress Notes (Signed)
[place] Hospital Day(s): [place] op day(s): 94 Days Post-Op.   Interval History:  Patient seen and examined No issues over the weekend [facility] working well  No abdominal pain; nausea, emesis, fevers No new labs [name] Pouch output 1000 ccs + unmeasured in last 24 hours She is on soft; on TPN Ambulating independently  CT Abdomen/Pelvis on 09/11 unfortunately unable to fully delineate anatomy of her EC fistulas; Dr Dahlia Byes potentially investigating tertiary center involvement for resection   Vital signs in last 24 hours: [min-max] current  Temp:  [98 F (36.7 C)-98.5 F (36.9 C)] 98.3 F (36.8 C) (09/12 0530) Pulse Rate:  [70-82] 77 (09/12 0530) Resp:  [16-21] 19 (09/12 0530) BP: (100-110)/(59-62) 100/60 (09/12 0530) SpO2:  [97 %-100 %] 97 % (09/12 0530)     Height: '4\' 11"'$  (149.9 cm) Weight: 95.9 kg BMI (Calculated): 43.04   Intake/Output last 2 shifts:  09/11 0701 - 09/12 0700 In: 663 [I.V.:663] Out: 1000 [Drains:1000]   Physical Exam:  Constitutional: alert, cooperative and no distress  Respiratory: breathing non-labored at rest  Cardiovascular: regular rate and sinus rhythm  Gastrointestinal: Soft, abdominal soreness on the right, non-distended, no rebound/guarding. Integumentary: Midline wound open, peritoneum closed; granulating;  there are three stomatized areas visible in the LUQ and LLQ portions of the wound, output remains bilious; red rubbed in dependent portion of [name]; no leaks MSK: RUE edema improved; non-tender, peripheral pulses 2+   Labs:     Latest Ref Rng & Units 05/15/2022    4:53 AM 05/12/2022    9:44 AM 05/10/2022    5:18 AM  CBC  WBC 4.0 - 10.5 K/uL 3.5  3.8  4.4   Hemoglobin 12.0 - 15.0 g/dL 7.8  7.9  8.1   Hematocrit 36.0 - 46.0 % 24.9  25.5  26.5   Platelets 150 - 400 K/uL 84  87  90       Latest Ref Rng & Units 05/19/2022    5:14 AM 05/15/2022    4:53 AM 05/12/2022    7:50 AM  CMP  Glucose  70 - 99 mg/dL 146  146  135   BUN 6 - 20 mg/dL '18  18  20   '$ Creatinine 0.44 - 1.00 mg/dL 0.41  0.43  0.43   Sodium 135 - 145 mmol/L 136  135  134   Potassium 3.5 - 5.1 mmol/L 3.8  3.8  4.5   Chloride 98 - 111 mmol/L 108  105  105   CO2 22 - 32 mmol/L '25  25  25   '$ Calcium 8.9 - 10.3 mg/dL 8.2  8.3  8.3   Total Protein 6.5 - 8.1 g/dL 6.2  6.6  6.4   Total Bilirubin 0.3 - 1.2 mg/dL 0.3  0.4  0.6   Alkaline Phos 38 - 126 U/L 98  105  109   AST 15 - 41 U/L '24  25  26   '$ ALT 0 - 44 U/L '17  18  19      '$ Imaging studies:  No new imaging studies   Assessment/Plan: 56 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  - New 09/12: Current system not leaking; no new issues. CT reviewed; will begin exploring disposition planning including  possible tertiary center transfer. Anticipate this may be a lengthy process as well.    - Wound Care: [name] pouch in place; now without red rubber to reduce pressure on wound. This is too foley bag with intermittent suction. There is some issues with pooling in the dependent portion of the wound/pouch. Continue to return to suction intermittently. These pouches need changed every 7-10 days (ideally). Changed 09/01; would extend life of current pouch system as long as feasible given limited supply of these  - On Soft diet + nutritional supplementation - Continue TPN at goal; not meeting PO needs yet   - Monitor abdominal examination; on-going bowel function  - Pain control prn; antemetic prn  - Out of bed; discharged from therapies; no recommendations at this time   - Discharge Planning: Anticipate lengthy admission and potential eventual transfer   All of the above findings and recommendations were  discussed with the patient, and the medical team, and all of patient's questions were answered to her expressed satisfaction.  -- [name], PA-C [facility] 05/20/2022, 7:41 AM M-F: 7am - 4pm

## 2022-05-21 LAB — GLUCOSE, CAPILLARY
Glucose-Capillary: 103 mg/dL — ABNORMAL HIGH (ref 70–99)
Glucose-Capillary: 152 mg/dL — ABNORMAL HIGH (ref 70–99)
Glucose-Capillary: 116 mg/dL — ABNORMAL HIGH (ref 70–99)
Glucose-Capillary: 136 mg/dL — ABNORMAL HIGH (ref 70–99)

## 2022-05-21 MED ORDER — TRACE MINERALS CU-MN-SE-ZN 300-55-60-3000 MCG/ML IV SOLN
INTRAVENOUS | Status: AC
  Filled 2022-05-21: qty 707.2

## 2022-05-21 NOTE — Progress Notes (Signed)
[place] Hospital Day(s): [place] op day(s): 95 Days Post-Op.   Interval History:  Patient seen and examined No issues over the weekend [facility] working well  No abdominal pain; nausea, emesis, fevers No new labs this morning [name] Pouch output 500 ccs + unmeasured in last 24 hours She is on soft; on TPN Ambulating independently    Vital signs in last 24 hours: [min-max] current  Temp:  [98.1 F (36.7 C)-98.6 F (37 C)] 98.2 F (36.8 C) (09/13 0427) Pulse Rate:  [77-87] 80 (09/13 0427) Resp:  [15-18] 18 (09/13 0427) BP: (97-109)/(53-66) 97/53 (09/13 0427) SpO2:  [98 %-100 %] 98 % (09/13 0427)     Height: '4\' 11"'$  (149.9 cm) Weight: 95.9 kg BMI (Calculated): 43.04   Intake/Output last 2 shifts:  09/12 0701 - 09/13 0700 In: 958.7 [P.O.:118; I.V.:840.7] Out: 500 [Drains:500]   Physical Exam:  Constitutional: alert, cooperative and no distress  Respiratory: breathing non-labored at rest  Cardiovascular: regular rate and sinus rhythm  Gastrointestinal: Soft, abdominal soreness on the right, non-distended, no rebound/guarding. Integumentary: Midline wound open, peritoneum closed; granulating;  there are three stomatized areas visible in the LUQ and LLQ portions of the wound, output remains bilious; red rubbed in dependent portion of [name]; no leaks MSK: RUE edema improved; non-tender, peripheral pulses 2+   Labs:     Latest Ref Rng & Units 05/15/2022    4:53 AM 05/12/2022    9:44 AM 05/10/2022    5:18 AM  CBC  WBC 4.0 - 10.5 K/uL 3.5  3.8  4.4   Hemoglobin 12.0 - 15.0 g/dL 7.8  7.9  8.1   Hematocrit 36.0 - 46.0 % 24.9  25.5  26.5   Platelets 150 - 400 K/uL 84  87  90       Latest Ref Rng & Units 05/19/2022    5:14 AM 05/15/2022    4:53 AM 05/12/2022    7:50 AM  CMP  Glucose 70 - 99 mg/dL 146  146  135   BUN 6 - 20 mg/dL '18  18  20   '$ Creatinine 0.44 - 1.00 mg/dL 0.41  0.43  0.43   Sodium 135 - 145 mmol/L 136  135  134    Potassium 3.5 - 5.1 mmol/L 3.8  3.8  4.5   Chloride 98 - 111 mmol/L 108  105  105   CO2 22 - 32 mmol/L '25  25  25   '$ Calcium 8.9 - 10.3 mg/dL 8.2  8.3  8.3   Total Protein 6.5 - 8.1 g/dL 6.2  6.6  6.4   Total Bilirubin 0.3 - 1.2 mg/dL 0.3  0.4  0.6   Alkaline Phos 38 - 126 U/L 98  105  109   AST 15 - 41 U/L '24  25  26   '$ ALT 0 - 44 U/L '17  18  19      '$ Imaging studies:  No new imaging studies   Assessment/Plan: 56 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  - New 09/13: Current system not leaking; no new issues.   - Wound Care: [name] pouch in place; now without red rubber to reduce pressure on wound. This is too foley bag with intermittent suction. There is  some issues with pooling in the dependent portion of the wound/pouch. Continue to return to suction intermittently. These pouches need changed every 7-10 days (ideally). Changed 09/01; would extend life of current pouch system as long as feasible given limited supply of these  - On Soft diet + nutritional supplementation - Continue TPN at goal; not meeting PO needs yet   - Monitor abdominal examination; on-going bowel function  - Pain control prn; antemetic prn  - Out of bed; discharged from therapies; no recommendations at this time   - Discharge Planning: Anticipate lengthy admission and potential eventual transfer   All of the above findings and recommendations were discussed with the patient, and the medical team, and all of patient's questions were answered to her expressed satisfaction.  -- [name], PA-C [facility] 05/21/2022, 7:32 AM M-F: 7am - 4pm

## 2022-05-21 NOTE — Plan of Care (Signed)
  Problem: Activity: Goal: Risk for activity intolerance will decrease Outcome: Progressing   Problem: Pain Managment: Goal: General experience of comfort will improve Outcome: Progressing   

## 2022-05-21 NOTE — Progress Notes (Signed)
PHARMACY - TOTAL PARENTERAL NUTRITION CONSULT NOTE   Indication: Prolonged ileus  Patient Measurements: Height: '4\' 11"'$  (149.9 cm) Weight: 95.7 kg (210 lb 15.7 oz) IBW/kg (Calculated) : 43.2 TPN AdjBW (KG): 55.2 Body mass index is 42.61 kg/m.  Assessment: Debra [name] is a 56 y.o. female s/p laparotomy, excision of greater omental mass, abdominal wall reconstruction with [name], appendectomy, and placement of Prevena vac.  Glucose / Insulin: No apparent history of diabetes. BG 103-128 ,4 units of SSI moderate over last 24 hrs + 15 units insulin in TPN bag Electrolytes: Na 136, Mag 1.8, Ca 8.2, alb 2.5, Phos 3.7, K 3.8 Renal: Scr 0.41 (baseline 0.3-0.6) Hepatic: No transaminitis. LFTs within normal limits. TG within normal limits  GI Imaging: 9/11 CTAP: no new acute issues GI Surgeries / Procedures: s/p laparotomy, excision of greater omental mass, abdominal wall reconstruction with [name], appendectomy, and placement of Prevena vac  Central access: 02/15/22 TPN start date: 02/15/22  Nutritional Goals: Goal TPN rate of 65 mL/hr (provides 106 g of protein and 1872 kcals per day)  RD Assessment:  Estimated Needs Total Energy Estimated Needs: 1800-2100kcal/day Total Protein Estimated Needs: 90-110g/d Total Fluid Estimated Needs: 1.4-1.6L/day  Current Nutrition:  Soft diet + nutritional supplements, not meeting PO needs yet   Plan: Continue TPN at 65 mL/hr Nutritional Components Amino acids (using 15% Clinisol): 106.1 grams Dextrose: 265.2 grams Lipids (using 20% SMOFlipids): 54.6 grams kCal: 1872/24h  Electrolytes in TPN (standard): Na 354mq/L, K 5854m/L, Ca 54m37mL, Mg 10 mEq/L , and Phos 10 mmol/L. Cl:Ac 1:1 Enteral multivitamin. Resuming trace elements per secure chat with RD Continue moderate SSI TIDM + CBG 4x/day (ACHS) + 12u insulin in TPN Monitor TPN labs on Mon/Thurs  Thank you for allowing pharmacy to be a part of this patient's  care.  CarGretel AcreharmD PGY1 Pharmacy Resident 05/21/2022 9:43 AM

## 2022-05-21 NOTE — Consult Note (Signed)
[place] Nurse wound follow up Wound type: midline abdominal fistula with [name] in place.  Secure seal, connected to wall suction.  Surgery in to assess this AM and would like to change appliance tomorrow at 0900.  [place] team following and will assist as needed tomorrow.  Domenic Moras MSN, RN, FNP-BC CWON Wound, Ostomy, Continence Nurse Pager [phone]

## 2022-05-22 DIAGNOSIS — K632 Fistula of intestine: Secondary | ICD-10-CM

## 2022-05-22 DIAGNOSIS — C187 Malignant neoplasm of sigmoid colon: Secondary | ICD-10-CM

## 2022-05-22 LAB — GLUCOSE, CAPILLARY
Glucose-Capillary: 141 mg/dL — ABNORMAL HIGH (ref 70–99)
Glucose-Capillary: 140 mg/dL — ABNORMAL HIGH (ref 70–99)
Glucose-Capillary: 134 mg/dL — ABNORMAL HIGH (ref 70–99)
Glucose-Capillary: 105 mg/dL — ABNORMAL HIGH (ref 70–99)

## 2022-05-22 LAB — COMPREHENSIVE METABOLIC PANEL
ALT: 18 U/L (ref 0–44)
AST: 23 U/L (ref 15–41)
Albumin: 2.7 g/dL — ABNORMAL LOW (ref 3.5–5.0)
Alkaline Phosphatase: 95 U/L (ref 38–126)
Anion gap: 4 — ABNORMAL LOW (ref 5–15)
BUN: 21 mg/dL — ABNORMAL HIGH (ref 6–20)
CO2: 24 mmol/L (ref 22–32)
Calcium: 8.2 mg/dL — ABNORMAL LOW (ref 8.9–10.3)
Chloride: 107 mmol/L (ref 98–111)
Creatinine, Ser: 0.39 mg/dL — ABNORMAL LOW (ref 0.44–1.00)
GFR, Estimated: >60 mL/min (ref >60)
Glucose, Bld: 143 mg/dL — ABNORMAL HIGH (ref 70–99)
Potassium: 3.9 mmol/L (ref 3.5–5.1)
Sodium: 135 mmol/L (ref 135–145)
Total Bilirubin: 0.4 mg/dL (ref 0.3–1.2)
Total Protein: 6.4 g/dL — ABNORMAL LOW (ref 6.5–8.1)

## 2022-05-22 LAB — MISC LABCORP TEST (SEND OUT): Labcorp test code: 724195

## 2022-05-22 LAB — VITAMIN B12: Vitamin B-12: 256 pg/mL (ref 180–914)

## 2022-05-22 LAB — PHOSPHORUS: Phosphorus: 3.3 mg/dL (ref 2.5–4.6)

## 2022-05-22 LAB — FERRITIN: Ferritin: 11 ng/mL (ref 11–307)

## 2022-05-22 LAB — MAGNESIUM: Magnesium: 1.8 mg/dL (ref 1.7–2.4)

## 2022-05-22 LAB — FOLATE: Folate: 13.2 ng/mL (ref >5.9)

## 2022-05-22 LAB — VITAMIN D 25 HYDROXY (VIT D DEFICIENCY, FRACTURES): Vit D, 25-Hydroxy: 24.1 ng/mL — ABNORMAL LOW (ref 30–100)

## 2022-05-22 LAB — TRIGLYCERIDES: Triglycerides: 41 mg/dL (ref <150)

## 2022-05-22 MED ORDER — ZINC CHLORIDE 1 MG/ML IV SOLN
INTRAVENOUS | Status: AC
  Filled 2022-05-22: qty 707.2

## 2022-05-22 MED ORDER — VITAMIN D 25 MCG (1000 UNIT) PO TABS
2000.0000 [IU] | ORAL_TABLET | Freq: Every day | ORAL | Status: DC
  Administered 2022-05-23 – 2022-06-23 (×32): 2000 [IU] via ORAL
  Filled 2022-05-22 (×32): qty 2

## 2022-05-22 NOTE — Progress Notes (Signed)
CC: EC fistula Subjective: Doing well tolerating diet, eakin working  950 cc output  Objective: Vital signs in last 24 hours: Temp:  [98.2 F (36.8 C)-98.4 F (36.9 C)] 98.2 F (36.8 C) (09/14 0759) Pulse Rate:  [77-81] 78 (09/14 0759) Resp:  [16-20] 16 (09/14 0759) BP: (102-106)/(58-65) 106/65 (09/14 0759) SpO2:  [97 %-98 %] 97 % (09/14 0759) Weight:  [95.7 kg] 95.7 kg (09/14 0500) Last BM Date : 05/20/22  Intake/Output from previous day: 09/13 0701 - 09/14 0700 In: 1502 [I.V.:1502] Out: 950 [Drains:950] Intake/Output this shift: Total I/O In: 940.4 [I.V.:940.4] Out: 300 [Stool:300]  Physical exam: Physical Exam:  Constitutional: alert, cooperative and no distress  Respiratory: breathing non-labored at rest  Gastrointestinal: Soft,  no rebound/guarding. Midline wound open, red rubbed in dependent portion of [name]; no leaks.  Lab Results: CBC  No results for input(s): "WBC", "HGB", "HCT", "PLT" in the last 72 hours. BMET Recent Labs    05/22/22 0509  NA 135  K 3.9  CL 107  CO2 24  GLUCOSE 143*  BUN 21*  CREATININE 0.39*  CALCIUM 8.2*   PT/INR No results for input(s): "LABPROT", "INR" in the last 72 hours. ABG No results for input(s): "PHART", "HCO3" in the last 72 hours.  Invalid input(s): "PCO2", "PO2"  Studies/Results: No results found.  Anti-infectives: Anti-infectives (From admission, onward)    Start     Dose/Rate Route Frequency Ordered Stop   03/04/22 2200  piperacillin-tazobactam (ZOSYN) IVPB 3.375 g        3.375 g 12.5 mL/hr over 240 Minutes Intravenous Every 8 hours 03/04/22 2005 03/18/22 0248   02/21/22 1500  vancomycin (VANCOREADY) IVPB 1250 mg/250 mL  Status:  Discontinued        1,250 mg 166.7 mL/hr over 90 Minutes Intravenous Every 24 hours 02/21/22 1111 02/24/22 1336   02/21/22 1400  meropenem (MERREM) 1 g in sodium chloride 0.9 % 100 mL IVPB        1 g 200 mL/hr over 30 Minutes Intravenous Every 8 hours 02/21/22 1258 02/27/22  2157   02/20/22 1500  vancomycin (VANCOCIN) IVPB 1000 mg/200 mL premix  Status:  Discontinued        1,000 mg 200 mL/hr over 60 Minutes Intravenous Every 24 hours 02/19/22 1706 02/21/22 1111   02/19/22 1115  vancomycin (VANCOREADY) IVPB 2000 mg/400 mL        2,000 mg 200 mL/hr over 120 Minutes Intravenous  Once 02/19/22 1025 02/19/22 1732   02/19/22 1115  fluconazole (DIFLUCAN) IVPB 400 mg        400 mg 100 mL/hr over 120 Minutes Intravenous Every 24 hours 02/19/22 1025 03/04/22 1301   02/15/22 0730  piperacillin-tazobactam (ZOSYN) IVPB 3.375 g  Status:  Discontinued        3.375 g 12.5 mL/hr over 240 Minutes Intravenous Every 8 hours 02/15/22 0639 02/21/22 1235   02/15/22 0655  piperacillin-tazobactam (ZOSYN) 3.375 GM/50ML IVPB       Note to Pharmacy: Glenford Peers N: cabinet override      02/15/22 0655 02/15/22 0816   02/13/22 2200  cefoTEtan (CEFOTAN) 2 g in sodium chloride 0.9 % 100 mL IVPB  Status:  Discontinued        2 g 200 mL/hr over 30 Minutes Intravenous Every 12 hours 02/13/22 1541 02/15/22 0639   02/13/22 1345  cefoTEtan (CEFOTAN) 2 g in sodium chloride 0.9 % 100 mL IVPB        2 g 200 mL/hr over 30 Minutes Intravenous  Once 02/13/22 1331 02/13/22 2146   02/13/22 1336  sodium chloride 0.9 % with cefoTEtan (CEFOTAN) ADS Med       Note to Pharmacy: Rulon Sera: cabinet override      02/13/22 1336 02/14/22 0144   02/13/22 0620  sodium chloride 0.9 % with cefoTEtan (CEFOTAN) ADS Med       Note to Pharmacy: Norton Blizzard A: cabinet override      02/13/22 0620 02/13/22 0802   02/13/22 0600  cefoTEtan (CEFOTAN) 2 g in sodium chloride 0.9 % 100 mL IVPB        2 g 200 mL/hr over 30 Minutes Intravenous On call to O.R. 02/12/22 2229 02/13/22 2146       Assessment/Plan:  Doing well  Continue current care [place], MD, FACS  05/22/2022

## 2022-05-22 NOTE — Consult Note (Signed)
[place] Nurse wound follow up Patient is spanish speaking.  Interpretive services used to obtain consent for pouch change. Patient denies need for pain meds at this time.  She has just returned from ambulating around the unit.  Wound type:midline abdominal fistula  [name] pouch connected to wall suction.  *Surgical PA not available to assess area today but agreed [place] team could change if needed.  Wound DVV:OHYWV in middle of open wound Drainage (amount, consistency, odor) effluent collects in distal aspect of wound, suction manages this Periwound: Denuded skin at 6 o'clock.  Protected entire perifistular area with stoma powder, skin prep and strips of hydrocolloid dressing.  Dressing procedure/placement/frequency:Photo obtained Periwound skin protected with stoma powder skin prep and strips of hydrocolloid  Barrier ring in abdominal pannus distal to pouching area to promote seal.  [name] applied. Connected to wall suction.  Suction canister changed at this time.  [place] team will see every 7-10 days.  Domenic Moras MSN, RN, FNP-BC CWON Wound, Ostomy, Continence Nurse Pager [phone]

## 2022-05-22 NOTE — TOC Progression Note (Signed)
Transition of Care [facility]) - Progression Note    Patient Details  Name: Debra [name] MRN: [id number] Date of Birth: August 07, 1966  [facility]) CM/SW Contact  Beverly Sessions, RN Phone Number: 05/22/2022, 4:21 PM  Clinical Narrative:    Per MD Continue current care TPN [name] pouch   Expected Discharge Plan: [place] Barriers to Discharge: Continued Medical Work up  Expected Discharge Plan and Services Expected Discharge Plan: [place]                                               Social Determinants of Health (SDOH) Interventions    Readmission Risk Interventions     No data to display

## 2022-05-22 NOTE — Progress Notes (Signed)
PHARMACY - TOTAL PARENTERAL NUTRITION CONSULT NOTE   Indication: Prolonged ileus  Patient Measurements: Height: '4\' 11"'$  (149.9 cm) Weight: 95.7 kg (210 lb 15.7 oz) IBW/kg (Calculated) : 43.2 TPN AdjBW (KG): 55.2 Body mass index is 42.61 kg/m.  Assessment: Debra [name] is a 56 y.o. female s/p laparotomy, excision of greater omental mass, abdominal wall reconstruction with [name], appendectomy, and placement of Prevena vac.  Glucose / Insulin: No apparent history of diabetes. BG 136-152 ,3 units of SSI moderate over last 24 hrs + 12 units insulin in TPN bag Electrolytes: Na 135, Mag 1.8, Ca 8.2, alb 2.7, Phos 3.3, K 3.9 Renal: Scr 0.39 (baseline 0.3-0.6) Hepatic: No transaminitis. LFTs within normal limits. TG within normal limits  GI Imaging: 9/11 CTAP: no new acute issues GI Surgeries / Procedures: s/p laparotomy, excision of greater omental mass, abdominal wall reconstruction with [name], appendectomy, and placement of Prevena vac  Central access: 02/15/22 TPN start date: 02/15/22  Nutritional Goals: Goal TPN rate of 65 mL/hr (provides 106 g of protein and 1872 kcals per day)  RD Assessment:  Estimated Needs Total Energy Estimated Needs: 1800-2100kcal/day Total Protein Estimated Needs: 90-110g/d Total Fluid Estimated Needs: 1.4-1.6L/day  Current Nutrition:  Soft diet + nutritional supplements, not meeting PO needs yet   Plan: Continue TPN at 65 mL/hr Nutritional Components Amino acids (using 15% Clinisol): 106.1 grams Dextrose: 265.2 grams Lipids (using 20% SMOFlipids): 54.6 grams kCal: 1872/24h  Electrolytes in TPN (standard): Na 20mq/L, K 521m/L, Ca 73m58mL, Mg 10 mEq/L , and Phos 10 mmol/L. Cl:Ac 1:1 Enteral multivitamin. Resuming trace elements per secure chat with RD. Adding Zinc 10 mg per secure chat with RD. Continue Chromium 10 mcg. Continue moderate SSI TIDM + CBG 4x/day (ACHS) + 12u insulin in TPN Monitor TPN labs on  Mon/Thurs  Thank you for allowing pharmacy to be a part of this patient's care.  CarGretel AcreharmD PGY1 Pharmacy Resident 05/22/2022 9:43 AM

## 2022-05-23 LAB — GLUCOSE, CAPILLARY
Glucose-Capillary: 139 mg/dL — ABNORMAL HIGH (ref 70–99)
Glucose-Capillary: 125 mg/dL — ABNORMAL HIGH (ref 70–99)
Glucose-Capillary: 95 mg/dL (ref 70–99)
Glucose-Capillary: 107 mg/dL — ABNORMAL HIGH (ref 70–99)

## 2022-05-23 MED ORDER — ZINC CHLORIDE 1 MG/ML IV SOLN
INTRAVENOUS | Status: AC
  Filled 2022-05-23: qty 707.2

## 2022-05-23 MED ORDER — SODIUM CHLORIDE 0.9 % IV SOLN
200.0000 mg | INTRAVENOUS | Status: AC
  Administered 2022-05-23 – 2022-05-25 (×3): 200 mg via INTRAVENOUS
  Filled 2022-05-23 (×3): qty 200

## 2022-05-23 NOTE — Progress Notes (Signed)
[place] Hospital Day(s): [place] op day(s): 97 Days Post-Op.   Interval History:  Patient seen and examined Windowless [name] working well; changed 09/14; ? Leak from inferior edge last night, nothing this AM No abdominal pain; nausea, emesis, fevers No new labs this morning [name] Pouch output 300 ccs + unmeasured in last 24 hours She is on soft; on TPN Ambulating independently    Vital signs in last 24 hours: [min-max] current  Temp:  [98.2 F (36.8 C)-98.7 F (37.1 C)] 98.4 F (36.9 C) (09/15 0500) Pulse Rate:  [78-82] 82 (09/15 0500) Resp:  [16-20] 20 (09/15 0500) BP: (106-129)/(53-67) 116/53 (09/15 0500) SpO2:  [97 %] 97 % (09/15 0500) Weight:  [95.8 kg] 95.8 kg (09/15 0500)     Height: '4\' 11"'$  (149.9 cm) Weight: 95.8 kg BMI (Calculated): 43.04   Intake/Output last 2 shifts:  09/14 0701 - 09/15 0700 In: 1607.8 [I.V.:1607.8] Out: 700 [Drains:400; Stool:300]   Physical Exam:  Constitutional: alert, cooperative and no distress  Respiratory: breathing non-labored at rest  Cardiovascular: regular rate and sinus rhythm  Gastrointestinal: Soft, abdominal soreness on the right, non-distended, no rebound/guarding. Integumentary: Midline wound open, peritoneum closed; granulating;  there are three stomatized areas visible in the LUQ and LLQ portions of the wound, output remains bilious; red rubbed in dependent portion of [name]; no leaks   Labs:     Latest Ref Rng & Units 05/15/2022    4:53 AM 05/12/2022    9:44 AM 05/10/2022    5:18 AM  CBC  WBC 4.0 - 10.5 K/uL 3.5  3.8  4.4   Hemoglobin 12.0 - 15.0 g/dL 7.8  7.9  8.1   Hematocrit 36.0 - 46.0 % 24.9  25.5  26.5   Platelets 150 - 400 K/uL 84  87  90       Latest Ref Rng & Units 05/22/2022    5:09 AM 05/19/2022    5:14 AM 05/15/2022    4:53 AM  CMP  Glucose 70 - 99 mg/dL 143  146  146   BUN 6 - 20 mg/dL '21  18  18   '$ Creatinine 0.44 - 1.00 mg/dL 0.39  0.41  0.43   Sodium 135 -  145 mmol/L 135  136  135   Potassium 3.5 - 5.1 mmol/L 3.9  3.8  3.8   Chloride 98 - 111 mmol/L 107  108  105   CO2 22 - 32 mmol/L '24  25  25   '$ Calcium 8.9 - 10.3 mg/dL 8.2  8.2  8.3   Total Protein 6.5 - 8.1 g/dL 6.4  6.2  6.6   Total Bilirubin 0.3 - 1.2 mg/dL 0.4  0.3  0.4   Alkaline Phos 38 - 126 U/L 95  98  105   AST 15 - 41 U/L '23  24  25   '$ ALT 0 - 44 U/L '18  17  18      '$ Imaging studies:  No new imaging studies   Assessment/Plan: 56 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  - New 09/14: Current system not leaking this AM; monitor. Otherwise, no new issues.   - Wound Care: [name] pouch in place; now with windowless pouch. Red rubber to LIS. These pouches  need changed every 7-10 days (ideally). Changed 09/13; would extend life of current pouch system as long as feasible given limited supply of these  - On Soft diet + nutritional supplementation - Continue TPN at goal; not meeting PO needs yet  - Appreciate dietary assistance with trace elements; iron supplementation  - Monitor abdominal examination; on-going bowel function  - Pain control prn; antemetic prn  - Out of bed; discharged from therapies; no recommendations at this time  - Discharge Planning: Anticipate lengthy admission and potential eventual transfer   All of the above findings and recommendations were discussed with the patient, and the medical team, and all of patient's questions were answered to her expressed satisfaction.  -- [name], PA-C [facility] 05/23/2022, 7:33 AM M-F: 7am - 4pm

## 2022-05-23 NOTE — Progress Notes (Signed)
PHARMACY - TOTAL PARENTERAL NUTRITION CONSULT NOTE   Indication: Prolonged ileus  Patient Measurements: Height: '4\' 11"'$  (149.9 cm) Weight: 95.8 kg (211 lb 3.2 oz) IBW/kg (Calculated) : 43.2 TPN AdjBW (KG): 55.2 Body mass index is 42.66 kg/m.  Assessment: Debra [name] is a 56 y.o. female s/p laparotomy, excision of greater omental mass, abdominal wall reconstruction with [name], appendectomy, and placement of Prevena vac.  Glucose / Insulin: No apparent history of diabetes. BG 105-140, 6 units of SSI moderate over last 24 hrs + 12 units insulin in TPN bag Electrolytes: Na 135, Mag 1.8, Ca 8.2, alb 2.7, Phos 3.3, K 3.9 Renal: Scr 0.39 (baseline 0.3-0.6) Hepatic: No transaminitis. LFTs within normal limits. TG within normal limits  GI Imaging: 9/11 CTAP: no new acute issues GI Surgeries / Procedures: s/p laparotomy, excision of greater omental mass, abdominal wall reconstruction with [name], appendectomy, and placement of Prevena vac  Central access: 02/15/22 TPN start date: 02/15/22  Nutritional Goals: Goal TPN rate of 65 mL/hr (provides 106 g of protein and 1872 kcals per day)  RD Assessment:  Estimated Needs Total Energy Estimated Needs: 1800-2100kcal/day Total Protein Estimated Needs: 90-110g/d Total Fluid Estimated Needs: 1.4-1.6L/day  Current Nutrition:  Soft diet + nutritional supplements, not meeting PO needs yet   Plan: Continue TPN at 65 mL/hr Nutritional Components Amino acids (using 15% Clinisol): 106.1 grams Dextrose: 265.2 grams Lipids (using 20% SMOFlipids): 54.6 grams kCal: 1872/24h  Electrolytes in TPN (standard): Na 77mq/L, K 570m/L, Ca 14m18mL, Mg 10 mEq/L , and Phos 10 mmol/L. Cl:Ac 1:1 Enteral multivitamin. Holding trace elements and chromium per secure chat with RD. Increasing Zinc to 15 mg per secure chat with RD.  Continue moderate SSI TIDM + CBG 4x/day (ACHS) + 12u insulin in TPN Monitor TPN labs on  Mon/Thurs  Thank you for allowing pharmacy to be a part of this patient's care.  CarGretel AcreharmD PGY1 Pharmacy Resident 05/23/2022 9:23 AM

## 2022-05-23 NOTE — Plan of Care (Signed)
  Problem: Clinical Measurements: Goal: Will remain free from infection Outcome: Progressing   Problem: Nutrition: Goal: Adequate nutrition will be maintained Outcome: Progressing   Problem: Activity: Goal: Risk for activity intolerance will decrease Outcome: Progressing   Problem: Coping: Goal: Level of anxiety will decrease Outcome: Progressing   Problem: Pain Managment: Goal: General experience of comfort will improve Outcome: Progressing

## 2022-05-24 DIAGNOSIS — K632 Fistula of intestine: Secondary | ICD-10-CM

## 2022-05-24 DIAGNOSIS — C187 Malignant neoplasm of sigmoid colon: Secondary | ICD-10-CM

## 2022-05-24 LAB — GLUCOSE, CAPILLARY
Glucose-Capillary: 139 mg/dL — ABNORMAL HIGH (ref 70–99)
Glucose-Capillary: 136 mg/dL — ABNORMAL HIGH (ref 70–99)
Glucose-Capillary: 132 mg/dL — ABNORMAL HIGH (ref 70–99)
Glucose-Capillary: 110 mg/dL — ABNORMAL HIGH (ref 70–99)

## 2022-05-24 LAB — VITAMIN K1, SERUM: VITAMIN K1: 0.32 ng/mL (ref 0.10–2.20)

## 2022-05-24 MED ORDER — ZINC CHLORIDE 1 MG/ML IV SOLN
INTRAVENOUS | Status: AC
  Filled 2022-05-24: qty 707.2

## 2022-05-24 NOTE — Progress Notes (Signed)
Collected vitals and reassessed blood pressure on left arm

## 2022-05-24 NOTE — Progress Notes (Signed)
Asked patient if she needed any daily care. She refused.

## 2022-05-24 NOTE — Progress Notes (Signed)
Collected vital signs and performed full body assessment

## 2022-05-24 NOTE — Progress Notes (Signed)
PHARMACY - TOTAL PARENTERAL NUTRITION CONSULT NOTE   Indication: Prolonged ileus  Patient Measurements: Height: '4\' 11"'$  (149.9 cm) Weight: 95.8 kg (211 lb 3.2 oz) IBW/kg (Calculated) : 43.2 TPN AdjBW (KG): 55.2 Body mass index is 42.66 kg/m.  Assessment: Debra [name] is a 56 y.o. female s/p laparotomy, excision of greater omental mass, abdominal wall reconstruction with [name], appendectomy, and placement of Prevena vac.  Glucose / Insulin: No apparent history of diabetes. BG 105-140, 4 units of SSI moderate over last 24 hrs + 12 units insulin in TPN bag Electrolytes: Na 135, Mag 1.8, Ca 8.2, alb 2.7, Phos 3.3, K 3.9 Renal: Scr 0.39 (baseline 0.3-0.6) Hepatic: No transaminitis. LFTs within normal limits. TG within normal limits  GI Imaging: 9/11 CTAP: no new acute issues GI Surgeries / Procedures: s/p laparotomy, excision of greater omental mass, abdominal wall reconstruction with [name], appendectomy, and placement of Prevena vac  Central access: 02/15/22 TPN start date: 02/15/22  Nutritional Goals: Goal TPN rate of 65 mL/hr (provides 106 g of protein and 1872 kcals per day)  RD Assessment:  Estimated Needs Total Energy Estimated Needs: 1800-2100kcal/day Total Protein Estimated Needs: 90-110g/d Total Fluid Estimated Needs: 1.4-1.6L/day  Current Nutrition:  Soft diet + nutritional supplements, not meeting PO needs yet   Plan: Continue TPN at 65 mL/hr Nutritional Components Amino acids (using 15% Clinisol): 106.1 grams Dextrose: 265.2 grams Lipids (using 20% SMOFlipids): 54.6 grams kCal: 1872/24h  Electrolytes in TPN (standard): Na 50mq/L, K 568m/L, Ca 29m76mL, Mg 10 mEq/L , and Phos 10 mmol/L. Cl:Ac 1:1 Enteral multivitamin. Holding trace elements and chromium per secure chat with RD. Increasing Zinc to 15 mg.  Continue moderate SSI TIDM + CBG 4x/day (ACHS) + 10u insulin in TPN Monitor TPN labs on Mon/Thurs  Thank you for allowing  pharmacy to be a part of this patient's care.  CarGretel AcreharmD PGY1 Pharmacy Resident 05/24/2022 8:29 AM

## 2022-05-24 NOTE — Progress Notes (Signed)
Debra [name] Hospital Day(s): 100.   Post op day(s): 98 Days Post-Op.   Interval History:  Patient seen and examined. Windowless [name] working well; changed 09/14; Leak from inferior edge this AM, Debra [name] to address with VAC draping  No abdominal pain; nausea, emesis, fevers No new labs this morning [name] Pouch output 1000 ccs  She is on soft; on TPN Ambulating independently    Vital signs in last 24 hours: [min-max] current  Temp:  [98.3 F (36.8 C)-98.4 F (36.9 C)] 98.4 F (36.9 C) (09/16 0800) Pulse Rate:  [77-82] 79 (09/16 0800) Resp:  [16-18] 18 (09/16 0800) BP: (94-112)/(49-86) 95/49 (09/16 0800) SpO2:  [95 %-99 %] 96 % (09/16 0800)     Height: '4\' 11"'$  (149.9 cm) Weight: 95.8 kg BMI (Calculated): 43.04   Intake/Output last 2 shifts:  09/15 0701 - 09/16 0700 In: 1134.2 [P.O.:240; I.V.:782.5; IV Piggyback:111.8] Out: 1000 [Drains:1000]   Physical Exam:  Constitutional: alert, cooperative and no distress  Respiratory: breathing non-labored at rest  Cardiovascular: regular rate and sinus rhythm  Gastrointestinal: Soft, abdominal soreness on the right, non-distended, no rebound/guarding. Integumentary: Midline wound open, peritoneum closed; granulating;  there are three stomatized areas visible in the LUQ and LLQ portions of the wound, output remains bilious; red rubber in dependent portion of [name]; no leak appreciated on my eval.    Labs:     Latest Ref Rng & Units 05/15/2022    4:53 AM 05/12/2022    9:44 AM 05/10/2022    5:18 AM  CBC  WBC 4.0 - 10.5 K/uL 3.5  3.8  4.4   Hemoglobin 12.0 - 15.0 g/dL 7.8  7.9  8.1   Hematocrit 36.0 - 46.0 % 24.9  25.5  26.5   Platelets 150 - 400 K/uL 84  87  90       Latest Ref Rng & Units 05/22/2022    5:09 AM 05/19/2022    5:14 AM 05/15/2022    4:53 AM  CMP  Glucose 70 - 99 mg/dL 143  146  146   BUN 6 - 20 mg/dL '21  18  18   '$ Creatinine 0.44 - 1.00 mg/dL 0.39  0.41  0.43   Sodium 135 - 145  mmol/L 135  136  135   Potassium 3.5 - 5.1 mmol/L 3.9  3.8  3.8   Chloride 98 - 111 mmol/L 107  108  105   CO2 22 - 32 mmol/L '24  25  25   '$ Calcium 8.9 - 10.3 mg/dL 8.2  8.2  8.3   Total Protein 6.5 - 8.1 g/dL 6.4  6.2  6.6   Total Bilirubin 0.3 - 1.2 mg/dL 0.4  0.3  0.4   Alkaline Phos 38 - 126 U/L 95  98  105   AST 15 - 41 U/L '23  24  25   '$ ALT 0 - 44 U/L '18  17  18      '$ Imaging studies:  No new imaging studies   Assessment/Plan: 56 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  - New 09/14: Current system not leaking this AM; monitor. Otherwise, no new issues.   - Wound Care: [name] pouch in place; now with windowless pouch. Red rubber to LIS. These pouches need changed  every 7-10 days (ideally). Changed 09/13; would extend life of current pouch system as long as feasible given limited supply of these  - On Soft diet + nutritional supplementation - Continue TPN at goal; not meeting PO needs yet  - Appreciate dietary assistance with trace elements; iron supplementation  - Monitor abdominal examination; on-going bowel function  - Pain control prn; antemetic prn  - Out of bed; discharged from therapies; no recommendations at this time  - Discharge Planning: Anticipate lengthy admission and potential eventual transfer - exploring different options for placement.  Potentially may entertain the patient home in a few weeks ago.  If feasible my be able to do [name] pouch to gravity but still not clear whether or not this is a feasible option  All of the above findings and recommendations were discussed with the patient, and the medical team, and all of patient's questions were answered to her expressed satisfaction.  Ronny Bacon, M.D., [facility]  05/24/2022 ; 10:13 AM

## 2022-05-24 NOTE — Progress Notes (Signed)
Assessed her pain

## 2022-05-24 NOTE — Progress Notes (Signed)
Administered medications

## 2022-05-25 DIAGNOSIS — K632 Fistula of intestine: Secondary | ICD-10-CM

## 2022-05-25 DIAGNOSIS — C187 Malignant neoplasm of sigmoid colon: Secondary | ICD-10-CM

## 2022-05-25 LAB — VITAMIN E
Vitamin E (Alpha Tocopherol): 11.7 mg/L (ref 7.0–25.1)
Vitamin E(Gamma Tocopherol): 0.6 mg/L (ref 0.5–5.5)

## 2022-05-25 LAB — GLUCOSE, CAPILLARY
Glucose-Capillary: 145 mg/dL — ABNORMAL HIGH (ref 70–99)
Glucose-Capillary: 150 mg/dL — ABNORMAL HIGH (ref 70–99)
Glucose-Capillary: 129 mg/dL — ABNORMAL HIGH (ref 70–99)
Glucose-Capillary: 118 mg/dL — ABNORMAL HIGH (ref 70–99)

## 2022-05-25 LAB — VITAMIN A: Vitamin A (Retinoic Acid): 18.5 ug/dL — ABNORMAL LOW (ref 20.1–62.0)

## 2022-05-25 MED ORDER — ZINC CHLORIDE 1 MG/ML IV SOLN
INTRAVENOUS | Status: AC
  Filled 2022-05-25: qty 707.2

## 2022-05-25 NOTE — Progress Notes (Signed)
[place] Hospital Day(s): 101.   Post op day(s): 99 Days Post-Op.   Interval History:  Patient seen and examined. [place] working well; changed 09/14; no leaks reported. No abdominal pain; nausea, emesis, fevers No new labs this morning [name] Pouch output 400 ccs  She is on soft; on TPN Ambulating independently    Vital signs in last 24 hours: [min-max] current  Temp:  [98 F (36.7 C)-98.7 F (37.1 C)] 98.2 F (36.8 C) (09/17 0732) Pulse Rate:  [74-78] 78 (09/17 0732) Resp:  [18-20] 18 (09/17 0732) BP: (98-105)/(52-62) 105/62 (09/17 0732) SpO2:  [95 %-98 %] 96 % (09/17 0732)     Height: '4\' 11"'$  (149.9 cm) Weight: 95.8 kg BMI (Calculated): 43.04   Intake/Output last 2 shifts:  09/16 0701 - 09/17 0700 In: 1713.3 [P.O.:240; I.V.:1376.2; IV Piggyback:97] Out: 400 [Drains:400]   Physical Exam:  Constitutional: alert, cooperative and no distress  Respiratory: breathing non-labored at rest  Cardiovascular: regular rate and sinus rhythm  Gastrointestinal: Soft, abdominal soreness on the right, non-distended, no rebound/guarding. Integumentary: Midline wound open, peritoneum closed; granulating;  there are three stomatized areas visible in the LUQ and LLQ portions of the wound, output remains bilious; red rubber in dependent portion of [name]; no leak appreciated on my eval.    Labs:     Latest Ref Rng & Units 05/15/2022    4:53 AM 05/12/2022    9:44 AM 05/10/2022    5:18 AM  CBC  WBC 4.0 - 10.5 K/uL 3.5  3.8  4.4   Hemoglobin 12.0 - 15.0 g/dL 7.8  7.9  8.1   Hematocrit 36.0 - 46.0 % 24.9  25.5  26.5   Platelets 150 - 400 K/uL 84  87  90       Latest Ref Rng & Units 05/22/2022    5:09 AM 05/19/2022    5:14 AM 05/15/2022    4:53 AM  CMP  Glucose 70 - 99 mg/dL 143  146  146   BUN 6 - 20 mg/dL '21  18  18   '$ Creatinine 0.44 - 1.00 mg/dL 0.39  0.41  0.43   Sodium 135 - 145 mmol/L 135  136  135   Potassium 3.5 - 5.1 mmol/L 3.9   3.8  3.8   Chloride 98 - 111 mmol/L 107  108  105   CO2 22 - 32 mmol/L '24  25  25   '$ Calcium 8.9 - 10.3 mg/dL 8.2  8.2  8.3   Total Protein 6.5 - 8.1 g/dL 6.4  6.2  6.6   Total Bilirubin 0.3 - 1.2 mg/dL 0.4  0.3  0.4   Alkaline Phos 38 - 126 U/L 95  98  105   AST 15 - 41 U/L '23  24  25   '$ ALT 0 - 44 U/L '18  17  18      '$ Imaging studies:  No new imaging studies   Assessment/Plan: 56 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  - New 09/14: Current system not leaking this AM; monitor. Otherwise, no new issues.   - Wound Care: [name] pouch in place; now with windowless pouch. Red rubber to LIS. These pouches need changed every 7-10 days (ideally). Changed 09/13; would extend life of  current pouch system as long as feasible given limited supply of these  - On Soft diet + nutritional supplementation - Continue TPN at goal; not meeting PO needs yet  - Appreciate dietary assistance with trace elements; iron supplementation  - Monitor abdominal examination; on-going bowel function  - Pain control prn; antemetic prn  - Out of bed; discharged from therapies; no recommendations at this time  - Discharge Planning: Anticipate lengthy admission and potential eventual transfer - exploring different options for placement.  Potentially may entertain the patient home in a few weeks ago.  If feasible my be able to do [name] pouch to gravity but still not clear whether or not this is a feasible option  All of the above findings and recommendations were discussed with the patient, and the medical team, and all of patient's questions were answered to her expressed satisfaction.  Ronny Bacon, M.D., [facility]  05/25/2022 ; 12:29 PM

## 2022-05-25 NOTE — Progress Notes (Signed)
PHARMACY - TOTAL PARENTERAL NUTRITION CONSULT NOTE   Indication: Prolonged ileus  Patient Measurements: Height: '4\' 11"'$  (149.9 cm) Weight: 95.8 kg (211 lb 3.2 oz) IBW/kg (Calculated) : 43.2 TPN AdjBW (KG): 55.2 Body mass index is 42.66 kg/m.  Assessment: Debra [name] is a 56 y.o. female s/p laparotomy, excision of greater omental mass, abdominal wall reconstruction with [name], appendectomy, and placement of Prevena vac.  Glucose / Insulin: No apparent history of diabetes. BG 110, 4 units of SSI moderate over last 24 hrs + 12 units insulin in TPN bag Electrolytes: Na 135, Mag 1.8, Ca 8.2, alb 2.7, Phos 3.3, K 3.9 Renal: Scr 0.39 (baseline 0.3-0.6) Hepatic: No transaminitis. LFTs within normal limits. TG within normal limits  GI Imaging: 9/11 CTAP: no new acute issues GI Surgeries / Procedures: s/p laparotomy, excision of greater omental mass, abdominal wall reconstruction with [name], appendectomy, and placement of Prevena vac  Central access: 02/15/22 TPN start date: 02/15/22  Nutritional Goals: Goal TPN rate of 65 mL/hr (provides 106 g of protein and 1872 kcals per day)  RD Assessment:  Estimated Needs Total Energy Estimated Needs: 1800-2100kcal/day Total Protein Estimated Needs: 90-110g/d Total Fluid Estimated Needs: 1.4-1.6L/day  Current Nutrition:  Soft diet + nutritional supplements, not meeting PO needs yet   Plan: Continue TPN at 65 mL/hr Nutritional Components Amino acids (using 15% Clinisol): 106.1 grams Dextrose: 265.2 grams Lipids (using 20% SMOFlipids): 54.6 grams kCal: 1872/24h  Electrolytes in TPN (standard): Na 33mq/L, K 551m/L, Ca 43m74mL, Mg 10 mEq/L , and Phos 10 mmol/L. Cl:Ac 1:1 Enteral multivitamin. Holding trace elements and chromium per secure chat with RD. Increasing Zinc to 15 mg.  Continue moderate SSI TIDM + CBG 4x/day (ACHS) + 10u insulin in TPN Monitor TPN labs on Mon/Thurs  Thank you for allowing  pharmacy to be a part of this patient's care.  KisEleonore ChiquitoharmD, BCPS 05/25/2022 7:29 AM

## 2022-05-26 LAB — COMPREHENSIVE METABOLIC PANEL
ALT: 14 U/L (ref 0–44)
AST: 29 U/L (ref 15–41)
Albumin: 2.7 g/dL — ABNORMAL LOW (ref 3.5–5.0)
Alkaline Phosphatase: 116 U/L (ref 38–126)
Anion gap: 4 — ABNORMAL LOW (ref 5–15)
BUN: 19 mg/dL (ref 6–20)
CO2: 26 mmol/L (ref 22–32)
Calcium: 8.2 mg/dL — ABNORMAL LOW (ref 8.9–10.3)
Chloride: 108 mmol/L (ref 98–111)
Creatinine, Ser: 0.41 mg/dL — ABNORMAL LOW (ref 0.44–1.00)
GFR, Estimated: >60 mL/min (ref >60)
Glucose, Bld: 143 mg/dL — ABNORMAL HIGH (ref 70–99)
Potassium: 3.9 mmol/L (ref 3.5–5.1)
Sodium: 138 mmol/L (ref 135–145)
Total Bilirubin: 0.2 mg/dL — ABNORMAL LOW (ref 0.3–1.2)
Total Protein: 6.5 g/dL (ref 6.5–8.1)

## 2022-05-26 LAB — MAGNESIUM: Magnesium: 1.8 mg/dL (ref 1.7–2.4)

## 2022-05-26 LAB — CBC
HCT: 27.3 % — ABNORMAL LOW (ref 36.0–46.0)
Hemoglobin: 8.3 g/dL — ABNORMAL LOW (ref 12.0–15.0)
MCH: 24.5 pg — ABNORMAL LOW (ref 26.0–34.0)
MCHC: 30.4 g/dL (ref 30.0–36.0)
MCV: 80.5 fL (ref 80.0–100.0)
Platelets: 90 10*3/uL — ABNORMAL LOW (ref 150–400)
RBC: 3.39 MIL/uL — ABNORMAL LOW (ref 3.87–5.11)
RDW: 17.5 % — ABNORMAL HIGH (ref 11.5–15.5)
WBC: 3.2 10*3/uL — ABNORMAL LOW (ref 4.0–10.5)
nRBC: 0 % (ref 0.0–0.2)

## 2022-05-26 LAB — PHOSPHORUS: Phosphorus: 3.2 mg/dL (ref 2.5–4.6)

## 2022-05-26 LAB — GLUCOSE, CAPILLARY
Glucose-Capillary: 137 mg/dL — ABNORMAL HIGH (ref 70–99)
Glucose-Capillary: 133 mg/dL — ABNORMAL HIGH (ref 70–99)
Glucose-Capillary: 125 mg/dL — ABNORMAL HIGH (ref 70–99)

## 2022-05-26 LAB — VITAMIN B6: Vitamin B6: 13.3 ug/L (ref 3.4–65.2)

## 2022-05-26 LAB — TRIGLYCERIDES: Triglycerides: 68 mg/dL (ref <150)

## 2022-05-26 MED ORDER — VITAMIN A 3 MG (10000 UNIT) PO CAPS
10000.0000 [IU] | ORAL_CAPSULE | Freq: Every day | ORAL | Status: DC
  Administered 2022-05-27 – 2022-06-30 (×35): 10000 [IU] via ORAL
  Filled 2022-05-26 (×36): qty 1

## 2022-05-26 MED ORDER — ZINC CHLORIDE 1 MG/ML IV SOLN
INTRAVENOUS | Status: AC
  Filled 2022-05-26: qty 707.2

## 2022-05-26 NOTE — TOC Progression Note (Signed)
Transition of Care [facility]) - Progression Note    Patient Details  Name: Debra [name] MRN: [id number] Date of Birth: Jul 23, 1966  [facility]) CM/SW Contact  Beverly Sessions, RN Phone Number: 05/26/2022, 3:49 PM  Clinical Narrative:     Per PA, MD to reach out to [facility]  In patients current state she would require TPN qhs and wound care with Eakins pouch to suction - per Pam with [employer] they do not provide charity TPN, would require LOG from [facility].  Per Marshall PA there is not an anticipated stop date for TPN - Patient would be responsible for purchasing all Elizabeth with [facility] at [facility] and there is not an approved devices they offer to provide suction to her Eakins pouch    Expected Discharge Plan: [place] Barriers to Discharge: Continued Medical Work up  Expected Discharge Plan and Services Expected Discharge Plan: [place]                                               Social Determinants of Health (SDOH) Interventions    Readmission Risk Interventions     No data to display

## 2022-05-26 NOTE — Progress Notes (Signed)
Nutrition Follow-up  DOCUMENTATION CODES:   Obesity unspecified  INTERVENTION:   Continue TPN per pharmacy- provides 1872kcal/day and 106g/day protein   Continue Zinc 65m daily added to TPN   Ensure Enlive po TID, each supplement provides 350 kcal and 20 grams of protein.  MVI po daily   Daily weights   Vitamin D3- 2000 units po daily   Vitamin A 10,000 units po daily   Vitamins B1 and B6 pending  NUTRITION DIAGNOSIS:   Increased nutrient needs related to wound healing, catabolic illness as evidenced by estimated needs.  GOAL:   Patient will meet greater than or equal to 90% of their needs -met with TPN   MONITOR:   PO intake, Supplement acceptance, Labs, Weight trends, Diet advancement, I & O's, TPN  ASSESSMENT:   56y/o female with h/o hypothyroidism, COVID 19 (3/21), kidney stones and stage 3 colon cancer (s/p left hemicolectomy 5/21 and chemotherapy) who is admitted for new pelvic mass now s/p laparotomy 6/8 (with excision of pelvic mass from greater omentum, abdominal wall reconstruction with bilateral myocutaneous flaps and mesh, incisional hernia repair, appendectomy repair and VAC placement) complicated by bowel perforation s/p reopening of recent laparotomy 6/10 (with repair of small bowel perforation, excision of mesh, placement of two phasix mesh and VAC placement). Pathology returned as metastatic adenocarcinoma.   Pt tolerating TPN at goal rate. Refeed labs stable. Triglycerides wnl. Hyperglycemia resolved. Insulin remains in TPN. Pt remains on a soft diet; pt eating <25% of meals. Pt is drinking some Ensure. Per chart, pt appears weight stable since admission. [name] pouch  with 8550moutput. Chromium and manganese returned slightly elevated; trace elements currently being held from TPN. Zinc returned low and is being added separately to TPN. Will plan to recheck labs next week. Vitamin labs being supplemented as needed; if water soluble labs return low, MVI  will need to be added back into the TPN. Ferritin returned lower than the previous level on 8/24; pt received iron sucrose 60095mver three days; will monitor and replete supplementation as needed as pt is not receiving any iron via the TPN.     Medications reviewed and include: D3, lovenox, insulin, L-glutamine, synthroid, imodium, MVI, protonix, carafate  -Selenium 220 wnl, Chromium 2.8(H), Manganese 27.7(H), copper 124 wnl, zinc 38.0(L)- 9/7 -Vitamin D 24.10(L), vitamin A 18.5(L), B12 256 wnl, vitamin E 11.7 wnl, vitamin K 0.32 wnl- 9/14  Labs reviewed: Na 138 wnl, K 3.9 wnl, creat 0.41(L), P 3.2 wnl, Mg 1.8 wnl, alk phos 116 wnl, AST 29 wnl, ALT 14 wnl, tbili 0.2(L) Triglycerides- 68 Iron 26(L), TIBC 328- 8/24 Ferritin- 11.0 wnl, folate 13.2 wnl- 9/14 Wbc- 3.2(L), Hgb 8.3(L), Hct 27.3(L), MCH 24.5(L) Cbgs-  133, 137 x 24 hrs  Diet Order:    Diet Order             DIET SOFT Room service appropriate? Yes; Fluid consistency: Thin  Diet effective now                  EDUCATION NEEDS:   Not appropriate for education at this time  Skin:  Skin Assessment: Reviewed RN Assessment ( Midline wound: 15cm x 10cm x 6cm )  Last BM:  9/17  Height:   Ht Readings from Last 1 Encounters:  03/01/22 '4\' 11"'  (1.499 m)    Weight:   Wt Readings from Last 1 Encounters:  05/26/22 96.7 kg    Ideal Body Weight:  44.3 kg  BMI:  Body mass  index is 43.06 kg/m.  Estimated Nutritional Needs:   Kcal:  1800-2100kcal/day  Protein:  90-110g/d  Fluid:  1.4-1.6L/day  Koleen Distance MS, RD, LDN Please refer to [facility] for RD and/or RD on-call/weekend/after hours pager

## 2022-05-26 NOTE — Consult Note (Signed)
[facility] Nurse fistula consult note Fistula type/location: EC; midline open abdominal wound with 2 stomatized fistulas and one this is not visible at wound bed level Perifistula assessment: open wound, proud flesh/hypergranulation from 5-7 o'clock due to constant moisture of the effuent  Treatment options for perifistula skin: lined periwound with strips of [name] material  Output blood today; two large clots removed from the surface of the wound bed as well as copious amounts of stool; suction was not working due to clots and viscosity of output  Fistula pouching: 1 large [name] used (windowless) option; red rubber catheter threaded into spout and LWS applied, sealed nicely. Updated orders to stress importance of not having the catheter in the wound bed.  Education provided: used Surveyor, quantity services during visit to explain rationale for pouch change and plans for suction.   Zach PA with surgery at the bedside. Discussion on taking the patient off suction for a portion of the day for trials; pending possible DC to home in about 1 month.  Coverage for [name] or any other supplies will be challenging based on patient resources.  Possible transfer to tertiary care for repair. Surgery team working with referral for same.    Debra [name] nursing team will follow along for support with fistula care and teaching   Debra [name] Debra [name], Debra [name], Debra [name]

## 2022-05-26 NOTE — Progress Notes (Signed)
PHARMACY - TOTAL PARENTERAL NUTRITION CONSULT NOTE   Indication: Prolonged ileus  Patient Measurements: Height: '4\' 11"'$  (149.9 cm) Weight: 96.7 kg (213 lb 3 oz) IBW/kg (Calculated) : 43.2 TPN AdjBW (KG): 55.2 Body mass index is 43.06 kg/m.  Assessment: Debra [name] is a 55 y.o. female s/p laparotomy, excision of greater omental mass, abdominal wall reconstruction with [name], appendectomy, and placement of Prevena vac.  Glucose / Insulin: No apparent history of diabetes. BG 118-150, 8 units of SSI moderate over last 24 hrs + 10 units insulin in TPN bag Electrolytes: Na 138, Mag 1.8, Ca 8.2, alb 2.7, Phos 3.2, K 3.9 ; overall unchanged from previous collection Renal: Scr 0.41 (baseline 0.3-0.6) Hepatic: No transaminitis. LFTs within normal limits. TG within normal limits  GI Imaging: 9/11 CTAP: no new acute issues GI Surgeries / Procedures: s/p laparotomy, excision of greater omental mass, abdominal wall reconstruction with [name], appendectomy, and placement of Prevena vac  Central access: 02/15/22 TPN start date: 02/15/22  Nutritional Goals: Goal TPN rate of 65 mL/hr (provides 106 g of protein and 1872 kcals per day)  RD Assessment:  Estimated Needs Total Energy Estimated Needs: 1800-2100kcal/day Total Protein Estimated Needs: 90-110g/d Total Fluid Estimated Needs: 1.4-1.6L/day  Current Nutrition:  Soft diet + nutritional supplements, not meeting PO needs yet   Plan: Continue TPN at 65 mL/hr Nutritional Components Amino acids (using 15% Clinisol): 106.1 grams Dextrose: 265.2 grams Lipids (using 20% SMOFlipids): 54.6 grams kCal: 1872/24h  Electrolytes in TPN (standard): Na 194mq/L, K 531m/L, Ca 94m66mL, Mg 10 mEq/L , and Phos 10 mmol/L. Cl:Ac 1:1 Enteral multivitamin. Holding trace elements and chromium per secure chat with RD. Increasing Zinc to 15 mg.  Continue moderate SSI TIDM + CBG 4x/day (ACHS) + 10u insulin in TPN Monitor TPN  labs on Mon/Thurs   Thank you for allowing pharmacy to be a part of this patient's care.   CarGlean SalvoharmD Clinical Pharmacist  05/26/2022 9:42 AM

## 2022-05-26 NOTE — Progress Notes (Signed)
[facility] Day(s): 102.   Post op day(s): 100 Days Post-Op.   Interval History:  Patient seen and examined Windowless [name]; leaking over the weekend; now with some bloody drainage No abdominal pain; nausea, emesis, fevers Nutritional labs are reassuring  [name] Pouch output 850 ccs + unmeasured in last 24 hours She is on soft; on TPN Ambulating independently    Vital signs in last 24 hours: [min-max] current  Temp:  [97.9 F (36.6 C)-98.3 F (36.8 C)] 97.9 F (36.6 C) (09/18 0522) Pulse Rate:  [72-82] 72 (09/18 0522) Resp:  [17-18] 18 (09/18 0522) BP: (94-107)/(57-64) 94/57 (09/18 0522) SpO2:  [96 %-98 %] 98 % (09/18 0522) Weight:  [96.7 kg] 96.7 kg (09/18 0500)     Height: '4\' 11"'$  (149.9 cm) Weight: 96.7 kg BMI (Calculated): 43.04   Intake/Output last 2 shifts:  09/17 0701 - 09/18 0700 In: 1783.3 [P.O.:240; I.V.:1543.3] Out: 850 [Drains:850]   Physical Exam:  Constitutional: alert, cooperative and no distress  Respiratory: breathing non-labored at rest  Cardiovascular: regular rate and sinus rhythm  Gastrointestinal: Soft, abdominal soreness on the right, non-distended, no rebound/guarding. Integumentary: Midline wound open, peritoneum closed; granulating;  there are three stomatized areas visible in the LUQ and LLQ portions of the wound, output remains feculent. There is hypergranulation tissue to the inferior wound edge.   Midline Wound (05/26/2022):      Labs:     Latest Ref Rng & Units 05/26/2022    5:09 AM 05/15/2022    4:53 AM 05/12/2022    9:44 AM  CBC  WBC 4.0 - 10.5 K/uL 3.2  3.5  3.8   Hemoglobin 12.0 - 15.0 g/dL 8.3  7.8  7.9   Hematocrit 36.0 - 46.0 % 27.3  24.9  25.5   Platelets 150 - 400 K/uL 90  84  87       Latest Ref Rng & Units 05/26/2022    5:09 AM 05/22/2022    5:09 AM 05/19/2022    5:14 AM  CMP  Glucose 70 - 99 mg/dL 143  143  146   BUN 6 - 20 mg/dL '19  21  18   '$ Creatinine 0.44 - 1.00  mg/dL 0.41  0.39  0.41   Sodium 135 - 145 mmol/L 138  135  136   Potassium 3.5 - 5.1 mmol/L 3.9  3.9  3.8   Chloride 98 - 111 mmol/L 108  107  108   CO2 22 - 32 mmol/L '26  24  25   '$ Calcium 8.9 - 10.3 mg/dL 8.2  8.2  8.2   Total Protein 6.5 - 8.1 g/dL 6.5  6.4  6.2   Total Bilirubin 0.3 - 1.2 mg/dL 0.2  0.4  0.3   Alkaline Phos 38 - 126 U/L 116  95  98   AST 15 - 41 U/L '29  23  24   '$ ALT 0 - 44 U/L '14  18  17      '$ Imaging studies:  No new imaging studies   Assessment/Plan: 56 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  - New 09/18: [name] changed at bedside by [place]; suspect bleeding is secondary to red rubber catheter being in the wound bed. No active bleeding at this time.   -  Wound Care: [name] pouch in place; now with windowless pouch. Red rubber to suction; ensure this is not in wound itself. These pouches need changed every 7-10 days (ideally). Changed 09/18; would extend life of current pouch system as long as feasible given limited supply of these  - On Soft diet + nutritional supplementation - Continue TPN at goal; not meeting PO needs yet  - Appreciate dietary assistance with trace elements; iron supplementation  - Monitor abdominal examination; on-going bowel function  - Pain control prn; antemetic prn  - Out of bed; discharged from therapies; no recommendations at this time  - Discharge Planning: Anticipate lengthy admission and potential eventual transfer. I will also begin investigating potential options to get her home in a month or so.   All of the above findings and recommendations were discussed with the patient, and the medical team, and all of patient's questions were answered to her expressed  satisfaction.  -- [name], PA-C [facility] 05/26/2022, 7:48 AM M-F: 7am - 4pm

## 2022-05-27 LAB — GLUCOSE, CAPILLARY
Glucose-Capillary: 146 mg/dL — ABNORMAL HIGH (ref 70–99)
Glucose-Capillary: 125 mg/dL — ABNORMAL HIGH (ref 70–99)
Glucose-Capillary: 128 mg/dL — ABNORMAL HIGH (ref 70–99)
Glucose-Capillary: 146 mg/dL — ABNORMAL HIGH (ref 70–99)

## 2022-05-27 MED ORDER — ZINC CHLORIDE 1 MG/ML IV SOLN
INTRAVENOUS | Status: AC
  Filled 2022-05-27: qty 707.2

## 2022-05-27 NOTE — Plan of Care (Signed)
  Problem: Nutrition: Goal: Adequate nutrition will be maintained Outcome: Progressing   Problem: Activity: Goal: Risk for activity intolerance will decrease Outcome: Progressing   Problem: Nutrition: Goal: Adequate nutrition will be maintained Outcome: Progressing   Problem: Pain Managment: Goal: General experience of comfort will improve Outcome: Progressing   

## 2022-05-27 NOTE — Progress Notes (Signed)
[facility] Hospital Day(s): 103.   Post op day(s): 101 Days Post-Op.   Interval History:  Patient seen and examined Debra [name]; working well; no issues today No abdominal pain; nausea, emesis, fevers No new labs this morning [name] Pouch output 1000 ccs + unmeasured in last 24 hours She is on soft; on TPN Ambulating independently    Vital signs in last 24 hours: [min-max] current  Temp:  [98.3 F (36.8 C)-98.4 F (36.9 C)] 98.3 F (36.8 C) (09/19 0514) Pulse Rate:  [82-90] 82 (09/19 0514) Resp:  [18-20] 20 (09/19 0514) BP: (106-127)/(61-71) 106/61 (09/19 0514) SpO2:  [95 %-100 %] 95 % (09/19 0514)     Height: '4\' 11"'$  (149.9 cm) Weight: 96.7 kg BMI (Calculated): 43.04   Intake/Output last 2 shifts:  09/18 0701 - 09/19 0700 In: 118 [P.O.:118] Out: 1000 [Drains:1000]   Physical Exam:  Constitutional: alert, cooperative and no distress  Respiratory: breathing non-labored at rest  Cardiovascular: regular rate and sinus rhythm  Gastrointestinal: Soft, abdominal soreness on the right, non-distended, no rebound/guarding. Integumentary: Midline wound open, peritoneum closed; granulating;  there are three stomatized areas visible in the LUQ and LLQ portions of the wound, output remains feculent. There is hypergranulation tissue to the inferior wound edge.    Labs:     Latest Ref Rng & Units 05/26/2022    5:09 AM 05/15/2022    4:53 AM 05/12/2022    9:44 AM  CBC  WBC 4.0 - 10.5 K/uL 3.2  3.5  3.8   Hemoglobin 12.0 - 15.0 g/dL 8.3  7.8  7.9   Hematocrit 36.0 - 46.0 % 27.3  24.9  25.5   Platelets 150 - 400 K/uL 90  84  87       Latest Ref Rng & Units 05/26/2022    5:09 AM 05/22/2022    5:09 AM 05/19/2022    5:14 AM  CMP  Glucose 70 - 99 mg/dL 143  143  146   BUN 6 - 20 mg/dL '19  21  18   '$ Creatinine 0.44 - 1.00 mg/dL 0.41  0.39  0.41   Sodium 135 - 145 mmol/L 138  135  136   Potassium 3.5 - 5.1 mmol/L 3.9  3.9  3.8   Chloride 98 -  111 mmol/L 108  107  108   CO2 22 - 32 mmol/L '26  24  25   '$ Calcium 8.9 - 10.3 mg/dL 8.2  8.2  8.2   Total Protein 6.5 - 8.1 g/dL 6.5  6.4  6.2   Total Bilirubin 0.3 - 1.2 mg/dL 0.2  0.4  0.3   Alkaline Phos 38 - 126 U/L 116  95  98   AST 15 - 41 U/L '29  23  24   '$ ALT 0 - 44 U/L '14  18  17      '$ Imaging studies:  No new imaging studies   Assessment/Plan: 56 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  - New 09/19: No new issues  - Wound Care: [name] pouch in place; now with Debra pouch. Red rubber to suction; ensure this is not in wound itself. These pouches need changed every 7-10 days (ideally). Changed 09/18; would extend life of current pouch system as long as  feasible given limited supply of these  - On Soft diet + nutritional supplementation - Continue TPN at goal; not meeting PO needs yet  - Appreciate dietary assistance with trace elements; iron supplementation  - Monitor abdominal examination; on-going bowel function  - Pain control prn; antemetic prn  - Out of bed; discharged from therapies; no recommendations at this time  - Discharge Planning: Anticipate lengthy admission and potential eventual transfer. I will also begin investigating potential options to get her home in a month or so.   All of the above findings and recommendations were discussed with the patient, and the medical team, and all of patient's questions were answered to her expressed satisfaction.  -- [name], PA-C [facility] 05/27/2022, 8:03 AM M-F: 7am - 4pm

## 2022-05-27 NOTE — Progress Notes (Signed)
Mobility Specialist - Progress Note    05/27/22 1130  Mobility  Activity Ambulated independently in hallway  Level of Assistance Independent  Assistive Device None (pushing IV Pole)  Distance Ambulated (ft) 360 ft  Activity Response Tolerated well  $Mobility charge 1 Mobility   Candie [place] Mobility Specialist 05/27/22 11:32 AM

## 2022-05-27 NOTE — Progress Notes (Signed)
PHARMACY - TOTAL PARENTERAL NUTRITION CONSULT NOTE   Indication: Prolonged ileus  Patient Measurements: Height: '4\' 11"'$  (149.9 cm) Weight: 96.7 kg (213 lb 3 oz) IBW/kg (Calculated) : 43.2 TPN AdjBW (KG): 55.2 Body mass index is 43.06 kg/m.  Assessment: Debra [name] is a 56 y.o. female s/p laparotomy, excision of greater omental mass, abdominal wall reconstruction with [name], appendectomy, and placement of Prevena vac.  Glucose / Insulin: No apparent history of diabetes. BG 118-150, 8 units of SSI moderate over last 24 hrs + 10 units insulin in TPN bag Electrolytes: Na 138, Mag 1.8, Ca 8.2, alb 2.7, Phos 3.2, K 3.9 ; overall unchanged from previous collection Renal: Scr 0.41 (baseline 0.3-0.6) Hepatic: No transaminitis. LFTs within normal limits. TG within normal limits  GI Imaging: 9/11 CTAP: no new acute issues GI Surgeries / Procedures: s/p laparotomy, excision of greater omental mass, abdominal wall reconstruction with [name], appendectomy, and placement of Prevena vac  Central access: 02/15/22 TPN start date: 02/15/22  Nutritional Goals: Goal TPN rate of 65 mL/hr (provides 106 g of protein and 1872 kcals per day)  RD Assessment:  Estimated Needs Total Energy Estimated Needs: 1800-2100kcal/day Total Protein Estimated Needs: 90-110g/d Total Fluid Estimated Needs: 1.4-1.6L/day  Current Nutrition:  Soft diet + nutritional supplements, not meeting PO needs yet   Plan: Continue TPN at 65 mL/hr Nutritional Components Amino acids (using 15% Clinisol): 106.1 grams Dextrose: 265.2 grams Lipids (using 20% SMOFlipids): 54.6 grams kCal: 1872/24h  Electrolytes in TPN (standard): Na 92mq/L, K 549m/L, Ca 32m41mL, Mg 10 mEq/L , and Phos 10 mmol/L. Cl:Ac 1:1 Enteral multivitamin. Holding trace elements and chromium per secure chat with RD. Increasing Zinc to 15 mg.  Continue moderate SSI TIDM + CBG 4x/day (ACHS) + 10u insulin in TPN Monitor TPN  labs on Mon/Thurs   Thank you for allowing pharmacy to be a part of this patient's care.   Tedford Berg Rodriguez-Guzman PharmD, BCPS 05/27/2022 11:58 AM

## 2022-05-27 NOTE — Plan of Care (Signed)
  Problem: Nutrition: Goal: Adequate nutrition will be maintained Outcome: Progressing   Problem: Activity: Goal: Risk for activity intolerance will decrease Outcome: Progressing   Problem: Nutrition: Goal: Adequate nutrition will be maintained Outcome: Progressing   Problem: Coping: Goal: Level of anxiety will decrease Outcome: Progressing   Problem: Pain Managment: Goal: General experience of comfort will improve Outcome: Progressing

## 2022-05-27 NOTE — Plan of Care (Signed)
  Problem: Clinical Measurements: Goal: Will remain free from infection Outcome: Progressing   Problem: Nutrition: Goal: Adequate nutrition will be maintained Outcome: Progressing   Problem: Activity: Goal: Risk for activity intolerance will decrease Outcome: Progressing   Problem: Nutrition: Goal: Adequate nutrition will be maintained Outcome: Progressing   Problem: Coping: Goal: Level of anxiety will decrease Outcome: Progressing   Problem: Pain Managment: Goal: General experience of comfort will improve Outcome: Progressing   Problem: Clinical Measurements: Goal: Will remain free from infection Outcome: Progressing   Problem: Nutrition: Goal: Adequate nutrition will be maintained Outcome: Progressing   Problem: Activity: Goal: Risk for activity intolerance will decrease Outcome: Progressing   Problem: Nutrition: Goal: Adequate nutrition will be maintained Outcome: Progressing   Problem: Coping: Goal: Level of anxiety will decrease Outcome: Progressing   Problem: Pain Managment: Goal: General experience of comfort will improve Outcome: Progressing

## 2022-05-28 LAB — GLUCOSE, CAPILLARY
Glucose-Capillary: 144 mg/dL — ABNORMAL HIGH (ref 70–99)
Glucose-Capillary: 112 mg/dL — ABNORMAL HIGH (ref 70–99)
Glucose-Capillary: 125 mg/dL — ABNORMAL HIGH (ref 70–99)
Glucose-Capillary: 113 mg/dL — ABNORMAL HIGH (ref 70–99)

## 2022-05-28 LAB — HEPARIN ANTI-XA: Heparin LMW: 0.22 IU/mL

## 2022-05-28 LAB — VITAMIN B1: Vitamin B1 (Thiamine): 59.6 nmol/L — ABNORMAL LOW (ref 66.5–200.0)

## 2022-05-28 MED ORDER — ZINC CHLORIDE 1 MG/ML IV SOLN
INTRAVENOUS | Status: AC
  Filled 2022-05-28: qty 707.2

## 2022-05-28 MED ORDER — PANTOPRAZOLE SODIUM 40 MG IV SOLR
40.0000 mg | Freq: Two times a day (BID) | INTRAVENOUS | Status: DC
  Administered 2022-05-28 – 2022-06-10 (×25): 40 mg via INTRAVENOUS
  Filled 2022-05-28 (×25): qty 10

## 2022-05-28 NOTE — TOC Progression Note (Signed)
Transition of Care [facility]) - Progression Note    Patient Details  Name: Debra [name] MRN: [id number] Date of Birth: 10/27/1965  [facility] CM/SW Contact  Beverly Sessions, RN Phone Number: 05/28/2022, 9:28 AM  Clinical Narrative:     Face sheet faxed to [facility] at [phone] per Dr pabon request   Expected Discharge Plan: [place] Barriers to Discharge: Continued Medical Work up  Expected Discharge Plan and Services Expected Discharge Plan: [place]                                               Social Determinants of Health (SDOH) Interventions    Readmission Risk Interventions     No data to display

## 2022-05-28 NOTE — Progress Notes (Signed)
PHARMACY - TOTAL PARENTERAL NUTRITION CONSULT NOTE   Indication: Prolonged ileus  Patient Measurements: Height: '4\' 11"'$  (149.9 cm) Weight: 98.5 kg (217 lb 2.5 oz) IBW/kg (Calculated) : 43.2 TPN AdjBW (KG): 55.2 Body mass index is 43.86 kg/m.  Assessment: Debra [name] is a 56 y.o. female s/p laparotomy, excision of greater omental mass, abdominal wall reconstruction with [name], appendectomy, and placement of Prevena vac.  Glucose / Insulin: No apparent history of diabetes. BG 118-150, 2 units of SSI moderate over last 24 hrs + 10 units insulin in TPN bag Electrolytes: Na 138, Mag 1.8, Ca 8.2, alb 2.7, Phos 3.2, K 3.9 ; overall unchanged from previous collection Renal: Scr 0.41 (baseline 0.3-0.6) Hepatic: No transaminitis. LFTs within normal limits. TG within normal limits  GI Imaging: 9/11 CTAP: no new acute issues GI Surgeries / Procedures: s/p laparotomy, excision of greater omental mass, abdominal wall reconstruction with [name], appendectomy, and placement of Prevena vac  Central access: 02/15/22 TPN start date: 02/15/22  Nutritional Goals: Goal TPN rate of 65 mL/hr (provides 106 g of protein and 1872 kcals per day)  RD Assessment:  Estimated Needs Total Energy Estimated Needs: 1800-2100kcal/day Total Protein Estimated Needs: 90-110g/d Total Fluid Estimated Needs: 1.4-1.6L/day  Current Nutrition:  Soft diet + nutritional supplements, not meeting PO needs yet   Plan: Continue TPN at 65 mL/hr Nutritional Components Amino acids (using 15% Clinisol): 106.1 grams Dextrose: 265.2 grams Lipids (using 20% SMOFlipids): 54.6 grams kCal: 1872/24h  Electrolytes in TPN (standard): Na 23mq/L, K 5103m/L, Ca 42m442mL, Mg 10 mEq/L , and Phos 10 mmol/L. Cl:Ac 1:1 Enteral multivitamin. Holding trace elements and chromium per secure chat with RD. Continue Zinc 15 mg.  Continue moderate SSI TIDM + CBG 4x/day (ACHS) + 10u insulin in TPN Monitor TPN labs  on Mon/Thurs   Thank you for allowing pharmacy to be a part of this patient's care.   Raquel Rodriguez-Guzman PharmD, BCPS 05/28/2022 12:09 PM

## 2022-05-28 NOTE — Progress Notes (Signed)
[place] Hospital Day(s): 104.   Post op day(s): 102 Days Post-Op.   Interval History:  Patient seen and examined Windowless [name]; working well; no issues today No abdominal pain; nausea, emesis, fevers No new labs this morning [name] Pouch output 860 ccs + unmeasured in last 24 hours She is on soft; on TPN Ambulating independently    Vital signs in last 24 hours: [min-max] current  Temp:  [98.1 F (36.7 C)-98.8 F (37.1 C)] 98.1 F (36.7 C) (09/20 0407) Pulse Rate:  [74-83] 74 (09/20 0407) Resp:  [16-20] 20 (09/20 0407) BP: (99-110)/(49-64) 102/58 (09/20 0407) SpO2:  [94 %-100 %] 100 % (09/20 0407) Weight:  [98.5 kg] 98.5 kg (09/20 0610)     Height: '4\' 11"'$  (149.9 cm) Weight: 98.5 kg BMI (Calculated): 43.04   Intake/Output last 2 shifts:  09/19 0701 - 09/20 0700 In: 1883.5 [P.O.:1195; I.V.:688.5] Out: 860 [Drains:860]   Physical Exam:  Constitutional: alert, cooperative and no distress  Respiratory: breathing non-labored at rest  Cardiovascular: regular rate and sinus rhythm  Gastrointestinal: Soft, abdominal soreness on the right, non-distended, no rebound/guarding. Integumentary: Midline wound open, peritoneum closed; granulating;  there are three stomatized areas visible in the LUQ and LLQ portions of the wound, output remains feculent. There is hypergranulation tissue to the inferior wound edge.    Labs:     Latest Ref Rng & Units 05/26/2022    5:09 AM 05/15/2022    4:53 AM 05/12/2022    9:44 AM  CBC  WBC 4.0 - 10.5 K/uL 3.2  3.5  3.8   Hemoglobin 12.0 - 15.0 g/dL 8.3  7.8  7.9   Hematocrit 36.0 - 46.0 % 27.3  24.9  25.5   Platelets 150 - 400 K/uL 90  84  87       Latest Ref Rng & Units 05/26/2022    5:09 AM 05/22/2022    5:09 AM 05/19/2022    5:14 AM  CMP  Glucose 70 - 99 mg/dL 143  143  146   BUN 6 - 20 mg/dL '19  21  18   '$ Creatinine 0.44 - 1.00 mg/dL 0.41  0.39  0.41   Sodium 135 - 145 mmol/L 138  135  136    Potassium 3.5 - 5.1 mmol/L 3.9  3.9  3.8   Chloride 98 - 111 mmol/L 108  107  108   CO2 22 - 32 mmol/L '26  24  25   '$ Calcium 8.9 - 10.3 mg/dL 8.2  8.2  8.2   Total Protein 6.5 - 8.1 g/dL 6.5  6.4  6.2   Total Bilirubin 0.3 - 1.2 mg/dL 0.2  0.4  0.3   Alkaline Phos 38 - 126 U/L 116  95  98   AST 15 - 41 U/L '29  23  24   '$ ALT 0 - 44 U/L '14  18  17      '$ Imaging studies:  No new imaging studies   Assessment/Plan: 56 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  - New 09/20: No new issues  - Wound Care: [name] pouch in place; now with windowless pouch. Red rubber to suction; ensure this is not in wound itself. These pouches need changed every 7-10 days (ideally). Changed 09/18; would  extend life of current pouch system as long as feasible given limited supply of these  - On Soft diet + nutritional supplementation - Continue TPN at goal; not meeting PO needs yet  - Appreciate dietary assistance with trace elements; iron supplementation  - Monitor abdominal examination; on-going bowel function  - Pain control prn; antemetic prn  - Out of bed; discharged from therapies; no recommendations at this time  - Discharge Planning: Anticipate lengthy admission and potential eventual transfer. I will also begin investigating potential options to get her home in a month or so.   All of the above findings and recommendations were discussed with the patient, and the medical team, and all of patient's questions were answered to her expressed satisfaction.  -- [name], PA-C [facility] 05/28/2022, 7:41 AM M-F: 7am - 4pm

## 2022-05-28 NOTE — Progress Notes (Addendum)
Brief Progress Note Returned to bedside this afternoon given concerns for bloody output from [name] pouch and suction not working. Upon arrival, it appears the the red rubber and/or tubing had clotted and suction was not working well. She also endorsed that this was leaking some from the "spout" portion of the [place]. With the help of the interpreter, patient endorsed a slow ooze in the wound over the last few days. She continues to deny any abdominal pain, nausea, emesis.   I was able to remove the current suction system. Through the spout, I was able to use a Yankauer suction tip and remove about 150 - 7 ccs of old blood and clots. There does not appear to be any active bleeding present. I suspect that the red rubber from time to time is migrating cephalad and irritating the hypergranulation tissue to the inferior edge of the wound. Again, I do not appreciate any significant active bleeding nor gross venous ooze.   I was able to change out the red rubber and re-secure this. She may benefit from longer holidays from suction, as feasible  Will hold Lovenox Switch to IV PPI BID CBC in AM along with standard M/Th nutritional labs  D/W Dr Dahlia Byes  Please call with questions/concerns  -- [name], PA-C [facility] 05/28/2022, 4:58 PM M-F: 7am - 4pm

## 2022-05-28 NOTE — Progress Notes (Signed)
I had a good conversation with Dr. Gwynneth Macleod From [facility] colorectal surgery. No beds available for xfer. Difficult placement as she has no health insurance coverage. I may need to keep trying . Apparently Dr. Theora Master might be able to evaluate her at some point in the near future. Dr. Cyndee Brightly abd wall reconstruction might be another option ( per Dr. Gwynneth Macleod) We will continue supportive care w TPN and eakin pocuh

## 2022-05-29 LAB — COMPREHENSIVE METABOLIC PANEL
ALT: 17 U/L (ref 0–44)
AST: 23 U/L (ref 15–41)
Albumin: 2.7 g/dL — ABNORMAL LOW (ref 3.5–5.0)
Alkaline Phosphatase: 113 U/L (ref 38–126)
Anion gap: 4 — ABNORMAL LOW (ref 5–15)
BUN: 19 mg/dL (ref 6–20)
CO2: 24 mmol/L (ref 22–32)
Calcium: 8.3 mg/dL — ABNORMAL LOW (ref 8.9–10.3)
Chloride: 107 mmol/L (ref 98–111)
Creatinine, Ser: 0.36 mg/dL — ABNORMAL LOW (ref 0.44–1.00)
GFR, Estimated: >60 mL/min (ref >60)
Glucose, Bld: 130 mg/dL — ABNORMAL HIGH (ref 70–99)
Potassium: 4.1 mmol/L (ref 3.5–5.1)
Sodium: 135 mmol/L (ref 135–145)
Total Bilirubin: 0.4 mg/dL (ref 0.3–1.2)
Total Protein: 6.2 g/dL — ABNORMAL LOW (ref 6.5–8.1)

## 2022-05-29 LAB — PREPARE RBC (CROSSMATCH)

## 2022-05-29 LAB — PHOSPHORUS: Phosphorus: 3.1 mg/dL (ref 2.5–4.6)

## 2022-05-29 LAB — CBC
HCT: 22.2 % — ABNORMAL LOW (ref 36.0–46.0)
Hemoglobin: 6.8 g/dL — ABNORMAL LOW (ref 12.0–15.0)
MCH: 25.4 pg — ABNORMAL LOW (ref 26.0–34.0)
MCHC: 30.6 g/dL (ref 30.0–36.0)
MCV: 82.8 fL (ref 80.0–100.0)
Platelets: 70 10*3/uL — ABNORMAL LOW (ref 150–400)
RBC: 2.68 MIL/uL — ABNORMAL LOW (ref 3.87–5.11)
RDW: 19.8 % — ABNORMAL HIGH (ref 11.5–15.5)
WBC: 3.1 10*3/uL — ABNORMAL LOW (ref 4.0–10.5)
nRBC: 0 % (ref 0.0–0.2)

## 2022-05-29 LAB — GLUCOSE, CAPILLARY
Glucose-Capillary: 147 mg/dL — ABNORMAL HIGH (ref 70–99)
Glucose-Capillary: 143 mg/dL — ABNORMAL HIGH (ref 70–99)
Glucose-Capillary: 114 mg/dL — ABNORMAL HIGH (ref 70–99)
Glucose-Capillary: 128 mg/dL — ABNORMAL HIGH (ref 70–99)

## 2022-05-29 LAB — MAGNESIUM: Magnesium: 1.8 mg/dL (ref 1.7–2.4)

## 2022-05-29 LAB — HEMOGLOBIN AND HEMATOCRIT, BLOOD
HCT: 30.3 % — ABNORMAL LOW (ref 36.0–46.0)
Hemoglobin: 9.3 g/dL — ABNORMAL LOW (ref 12.0–15.0)

## 2022-05-29 LAB — TRIGLYCERIDES: Triglycerides: 72 mg/dL (ref <150)

## 2022-05-29 MED ORDER — ZINC CHLORIDE 1 MG/ML IV SOLN
INTRAVENOUS | Status: AC
  Filled 2022-05-29: qty 707.2

## 2022-05-29 MED ORDER — SODIUM CHLORIDE 0.9% IV SOLUTION: Freq: Once | INTRAVENOUS | Status: AC

## 2022-05-29 NOTE — Progress Notes (Signed)
[facility] SUR[facility]ICAL PROGRESS NOTE (cpt [phone])  Hospital Day(s): 105.   Post op day(s): 103 Days Post-Op.   Interval History:  Patient seen and examined no acute events or new complaints overnight.  Hgb did drop to 6.8 (from 8.3); bleeding in [name] has subsided Continues to be mildly leukopenic; 3.1K BMP and nutritional labs otherwise reassuring [employer]; working well; no issues today No abdominal pain; nausea, emesis, fevers No new labs this morning [name] Pouch output 900 ccs + unmeasured in last 24 hours She is on soft; on TPN Ambulating independently   Review of Systems:  Constitutional: denies fever, chills  HEENT: denies cough or congestion  Respiratory: denies any shortness of breath  Cardiovascular: denies chest pain or palpitations  Gastrointestinal: denies abdominal pain, N/V Genitourinary: denies burning with urination or urinary frequency Integumentary: + midline wound  Vital signs in last 24 hours: [min-max] current  Temp:  [98 F (36.7 C)-98.1 F (36.7 C)] 98.1 F (36.7 C) (09/21 0358) Pulse Rate:  [72-78] 78 (09/21 0358) Resp:  [20] 20 (09/21 0358) BP: (101-102)/(56-59) 101/56 (09/21 0358) SpO2:  [99 %-100 %] 100 % (09/21 0358) Weight:  [95.3 kg] 95.3 kg (09/21 0358)     Height: '4\' 11"'$  (149.9 cm) Weight: 95.3 kg BMI (Calculated): 43.04   Intake/Output last 2 shifts:  09/20 0701 - 09/21 0700 In: 703.3 [P.O.:120; I.V.:583.3] Out: 900 [Drains:900]   Physical Exam:  Constitutional: alert, cooperative and no distress  Respiratory: breathing non-labored at rest  Cardiovascular: regular rate and sinus rhythm  Gastrointestinal: Soft, abdominal soreness on the right, non-distended, no rebound/guarding. Integumentary: Midline wound open, peritoneum closed; granulating;  there are three stomatized areas visible in the LUQ and LLQ portions of the wound, output remains feculent. There is hypergranulation tissue to the inferior wound edge. No  further bleeding    Labs:     Latest Ref Rng & Units 05/29/2022    4:52 AM 05/26/2022    5:09 AM 05/15/2022    4:53 AM  CBC  WBC 4.0 - 10.5 K/uL 3.1  3.2  3.5   Hemoglobin 12.0 - 15.0 g/dL 6.8  8.3  7.8   Hematocrit 36.0 - 46.0 % 22.2  27.3  24.9   Platelets 150 - 400 K/uL 70  90  84       Latest Ref Rng & Units 05/29/2022    4:52 AM 05/26/2022    5:09 AM 05/22/2022    5:09 AM  CMP  Glucose 70 - 99 mg/dL 130  143  143   BUN 6 - 20 mg/dL '19  19  21   '$ Creatinine 0.44 - 1.00 mg/dL 0.36  0.41  0.39   Sodium 135 - 145 mmol/L 135  138  135   Potassium 3.5 - 5.1 mmol/L 4.1  3.9  3.9   Chloride 98 - 111 mmol/L 107  108  107   CO2 22 - 32 mmol/L '24  26  24   '$ Calcium 8.9 - 10.3 mg/dL 8.3  8.2  8.2   Total Protein 6.5 - 8.1 g/dL 6.2  6.5  6.4   Total Bilirubin 0.3 - 1.2 mg/dL 0.4  0.2  0.4   Alkaline Phos 38 - 126 U/L 113  116  95   AST 15 - 41 U/L '23  29  23   '$ ALT 0 - 44 U/L '17  14  18     '$ Imaging studies: No new pertinent imaging studies   Assessment/Plan:  56 y.o.  female with high output enterocutaneous fistula 103 Days Post-Op s/p re-opening of laparotomy for repair of small bowel perforation following initial laparotomy, excision of greater omental mass, abdominal wall reconstruction with [name], appendectomy, and placement of Prevena vac on 06/08.   - New 09/21: No new issues; bleeding subsided but Hgb down to 6.8; Will give 1 unit pRBCs   - Wound Care: [name] pouch in place; now with windowless pouch. Red rubber to suction; ensure this is not in wound itself. These pouches need changed every 7-10 days (ideally). Changed 09/18; would extend life of current pouch system as long as feasible given limited supply of these   - On Soft diet + nutritional supplementation - Continue TPN at goal; not meeting PO needs yet  - Appreciate dietary assistance with trace elements; iron supplementation            - Monitor abdominal examination; on-going bowel function            -  Pain control prn; antemetic prn            - Out of bed; discharged from therapies; no recommendations at this time   - Discharge Planning: Anticipate lengthy admission and potential eventual transfer. I will also begin investigating potential options to get her home in a month or so.    All of the above findings and recommendations were discussed with the patient, and the medical team, and all of patient's questions were answered to her expressed satisfaction.  -- [name], PA-C [facility] 05/29/2022, 8:08 AM M-F: 7am - 4pm

## 2022-05-29 NOTE — Progress Notes (Signed)
PHARMACY - TOTAL PARENTERAL NUTRITION CONSULT NOTE   Indication: Prolonged ileus  Patient Measurements: Height: '4\' 11"'$  (149.9 cm) Weight: 95.3 kg (210 lb 1.6 oz) IBW/kg (Calculated) : 43.2 TPN AdjBW (KG): 55.2 Body mass index is 42.43 kg/m.  Assessment: Debra [name] is a 56 y.o. female s/p laparotomy, excision of greater omental mass, abdominal wall reconstruction with [name], appendectomy, and placement of Prevena vac.  Glucose / Insulin: No apparent history of diabetes. BG 118-150, 4 units of SSI moderate over last 24 hrs + 10 units insulin in TPN bag Electrolytes: Na 138, Mag 1.8, Ca 8.2, alb 2.7, Phos 3.2, K 3.9 ; overall unchanged from previous collection Renal: Scr 0.36 (baseline 0.3-0.6) Hepatic: No transaminitis. LFTs within normal limits. TG within normal limits  GI Imaging: 9/11 CTAP: no new acute issues GI Surgeries / Procedures: s/p laparotomy, excision of greater omental mass, abdominal wall reconstruction with [name], appendectomy, and placement of Prevena vac  Central access: 02/15/22 TPN start date: 02/15/22  Nutritional Goals: Goal TPN rate of 65 mL/hr (provides 106 g of protein and 1872 kcals per day)  RD Assessment:  Estimated Needs Total Energy Estimated Needs: 1800-2100kcal/day Total Protein Estimated Needs: 90-110g/d Total Fluid Estimated Needs: 1.4-1.6L/day  Current Nutrition:  Soft diet + nutritional supplements, not meeting PO needs yet   Plan: Continue TPN at 65 mL/hr Nutritional Components Amino acids (using 15% Clinisol): 106.1 grams Dextrose: 265.2 grams Lipids (using 20% SMOFlipids): 54.6 grams kCal: 1872/24h  Electrolytes in TPN (standard): Na 33mq/L, K 540m/L, Ca 63m42mL, Mg 10 mEq/L , and Phos 10 mmol/L. Cl:Ac 1:1 Enteral multivitamin. Adding thiamine '100mg'$  back to TPN as B1 level was subtherapeutic. Holding trace elements and chromium per secure chat with RD. Continue Zinc 15 mg.  Continue moderate  SSI TIDM + CBG 4x/day (ACHS) + 10u insulin in TPN Monitor TPN labs on Mon/Thurs   Thank you for allowing pharmacy to be a part of this patient's care.   [name] Clinical Pharmacist 05/29/2022 2:14 PM

## 2022-05-29 NOTE — TOC Progression Note (Signed)
Transition of Care [facility]) - Progression Note    Patient Details  Name: Tashai Catino MRN: [id number] Date of Birth: 1966-06-28  Transition of Care [facility] [place]) CM/SW Contact  Beverly Sessions, RN Phone Number: 05/29/2022, 2:32 PM  Clinical Narrative:     Per Surgery [facility] has been reached out to.  Face sheet has been faxed by [facility], and Per Thedore Mins PA images have been uploaded for [facility] to review   Expected Discharge Plan: [place] Barriers to Discharge: Continued Medical Work up  Expected Discharge Plan and Services Expected Discharge Plan: [place]                                               Social Determinants of Health (SDOH) Interventions    Readmission Risk Interventions     No data to display

## 2022-05-29 NOTE — Plan of Care (Signed)
  Problem: Clinical Measurements: Goal: Will remain free from infection Outcome: Progressing   Problem: Nutrition: Goal: Adequate nutrition will be maintained Outcome: Progressing   Problem: Activity: Goal: Risk for activity intolerance will decrease Outcome: Progressing   Problem: Coping: Goal: Level of anxiety will decrease Outcome: Progressing   Problem: Pain Managment: Goal: General experience of comfort will improve Outcome: Progressing

## 2022-05-30 LAB — TYPE AND SCREEN
ABO/RH(D): O POS
Antibody Screen: NEGATIVE
Unit division: 0

## 2022-05-30 LAB — BPAM RBC
Blood Product Expiration Date: 202310242359
ISSUE DATE / TIME: 202309211404
Unit Type and Rh: 5100

## 2022-05-30 LAB — GLUCOSE, CAPILLARY
Glucose-Capillary: 136 mg/dL — ABNORMAL HIGH (ref 70–99)
Glucose-Capillary: 142 mg/dL — ABNORMAL HIGH (ref 70–99)
Glucose-Capillary: 123 mg/dL — ABNORMAL HIGH (ref 70–99)
Glucose-Capillary: 118 mg/dL — ABNORMAL HIGH (ref 70–99)

## 2022-05-30 MED ORDER — ZINC CHLORIDE 1 MG/ML IV SOLN
INTRAVENOUS | Status: AC
  Filled 2022-05-30: qty 707.2

## 2022-05-30 NOTE — Progress Notes (Signed)
   05/30/22 1600  Clinical Encounter Type  Visited With Patient  Visit Type Follow-up  Spiritual Encounters  Spiritual Needs Emotional;Prayer   Chaplain encountered patient walking in hallway and recognized her from previous visit. Chaplain accompanied her and visited and prayed with her. She is Spanish only and so does not get much interaction. She seems a bit down but after our conversation her spirits seemed to improve.

## 2022-05-30 NOTE — Progress Notes (Signed)
[facility] PROGRESS NOTE (cpt [phone])  Hospital Day(s): 106.   Post op day(s): 104 Days Post-Op.   Interval History:  Patient seen and examined no acute events or new complaints overnight.  Hgb up to 9.3 after 1 unit pRBCs yesterday (09/21); bleeding in [place] has subsided [employer]; working well; no issues today No abdominal pain; nausea, emesis, fevers No new labs this morning [name] Pouch output 800 ccs + unmeasured in last 24 hours She is on soft; on TPN Ambulating independently   Review of Systems:  Constitutional: denies fever, chills  HEENT: denies cough or congestion  Respiratory: denies any shortness of breath  Cardiovascular: denies chest pain or palpitations  Gastrointestinal: denies abdominal pain, N/V Genitourinary: denies burning with urination or urinary frequency Integumentary: + midline wound  Vital signs in last 24 hours: [min-max] current  Temp:  [97.9 F (36.6 C)-98.8 F (37.1 C)] 97.9 F (36.6 C) (09/22 0743) Pulse Rate:  [68-85] 68 (09/22 0743) Resp:  [15-18] 18 (09/22 0743) BP: (97-116)/(56-66) 102/57 (09/22 0743) SpO2:  [97 %-100 %] 98 % (09/22 0743) Weight:  [95.9 kg] 95.9 kg (09/22 0500)     Height: '4\' 11"'$  (149.9 cm) Weight: 95.9 kg BMI (Calculated): 43.04   Intake/Output last 2 shifts:  09/21 0701 - 09/22 0700 In: 2583.2 [P.O.:598; I.V.:1703.2; Blood:282] Out: 800 [Drains:800]   Physical Exam:  Constitutional: alert, cooperative and no distress  Respiratory: breathing non-labored at rest  Cardiovascular: regular rate and sinus rhythm  Gastrointestinal: Soft, abdominal soreness on the right, non-distended, no rebound/guarding. Integumentary: Midline wound open, peritoneum closed; granulating;  there are three stomatized areas visible in the LUQ and LLQ portions of the wound, output remains feculent. There is hypergranulation tissue to the inferior wound edge. No further bleeding    Labs:     Latest Ref Rng &  Units 05/29/2022    7:17 PM 05/29/2022    4:52 AM 05/26/2022    5:09 AM  CBC  WBC 4.0 - 10.5 K/uL  3.1  3.2   Hemoglobin 12.0 - 15.0 g/dL 9.3  6.8  8.3   Hematocrit 36.0 - 46.0 % 30.3  22.2  27.3   Platelets 150 - 400 K/uL  70  90       Latest Ref Rng & Units 05/29/2022    4:52 AM 05/26/2022    5:09 AM 05/22/2022    5:09 AM  CMP  Glucose 70 - 99 mg/dL 130  143  143   BUN 6 - 20 mg/dL '19  19  21   '$ Creatinine 0.44 - 1.00 mg/dL 0.36  0.41  0.39   Sodium 135 - 145 mmol/L 135  138  135   Potassium 3.5 - 5.1 mmol/L 4.1  3.9  3.9   Chloride 98 - 111 mmol/L 107  108  107   CO2 22 - 32 mmol/L '24  26  24   '$ Calcium 8.9 - 10.3 mg/dL 8.3  8.2  8.2   Total Protein 6.5 - 8.1 g/dL 6.2  6.5  6.4   Total Bilirubin 0.3 - 1.2 mg/dL 0.4  0.2  0.4   Alkaline Phos 38 - 126 U/L 113  116  95   AST 15 - 41 U/L '23  29  23   '$ ALT 0 - 44 U/L '17  14  18     '$ Imaging studies: No new pertinent imaging studies   Assessment/Plan:  56 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   - New 09/22: No new issues; bleeding subsided; hgb up appropriately   - Wound Care: [name] pouch in place; now with windowless pouch. Red rubber to suction; ensure this is not in wound itself. These pouches need changed every 7-10 days (ideally). Changed 09/18; would extend life of current pouch system as long as feasible given limited supply of these   - On Soft diet + nutritional supplementation - Continue TPN at goal; not meeting PO needs yet  - Appreciate dietary assistance with trace elements; iron supplementation            - Monitor abdominal examination; on-going bowel function            - Pain control prn; antemetic prn            - Out of bed; discharged from  therapies; no recommendations at this time   - Discharge Planning: Anticipate lengthy admission and potential eventual transfer. I will also begin investigating potential options to get her home in a month or so.    All of the above findings and recommendations were discussed with the patient, and the medical team, and all of patient's questions were answered to her expressed satisfaction.  -- [name], PA-C [facility] 05/30/2022, 8:48 AM M-F: 7am - 4pm

## 2022-05-30 NOTE — Plan of Care (Signed)
  Problem: Clinical Measurements: Goal: Will remain free from infection Outcome: Progressing   Problem: Nutrition: Goal: Adequate nutrition will be maintained Outcome: Progressing   Problem: Activity: Goal: Risk for activity intolerance will decrease Outcome: Progressing   Problem: Coping: Goal: Level of anxiety will decrease Outcome: Progressing   Problem: Pain Managment: Goal: General experience of comfort will improve Outcome: Progressing

## 2022-05-30 NOTE — Progress Notes (Addendum)
Spanish interpreter was utilized Debra [name] # [id number] about pt medicines and suction plan. No question at this time per pt. Will continue to monitor.

## 2022-05-30 NOTE — Progress Notes (Signed)
PHARMACY - TOTAL PARENTERAL NUTRITION CONSULT NOTE   Indication: Prolonged ileus  Patient Measurements: Height: '4\' 11"'$  (149.9 cm) Weight: 95.9 kg (211 lb 6.7 oz) IBW/kg (Calculated) : 43.2 TPN AdjBW (KG): 55.2 Body mass index is 42.7 kg/m.  Assessment: Debra [name] is a 56 y.o. female s/p laparotomy, excision of greater omental mass, abdominal wall reconstruction with [name], appendectomy, and placement of Prevena vac.  Glucose / Insulin: No apparent history of diabetes. BG 118-150, 4 units of SSI moderate over last 24 hrs + 10 units insulin in TPN bag Electrolytes: Na 138, Mag 1.8, Ca 8.3, alb 2.7, Phos 3.1, K 4.1 ; overall unchanged from previous collection Renal: Scr 0.36 (baseline 0.3-0.6) Hepatic: No transaminitis. LFTs within normal limits. TG within normal limits  GI Imaging: 9/11 CTAP: no new acute issues GI Surgeries / Procedures: s/p laparotomy, excision of greater omental mass, abdominal wall reconstruction with [name], appendectomy, and placement of Prevena vac  Central access: 02/15/22 TPN start date: 02/15/22  Nutritional Goals: Goal TPN rate of 65 mL/hr (provides 106 g of protein and 1872 kcals per day)  RD Assessment:  Estimated Needs Total Energy Estimated Needs: 1800-2100kcal/day Total Protein Estimated Needs: 90-110g/d Total Fluid Estimated Needs: 1.4-1.6L/day  Current Nutrition:  Soft diet + nutritional supplements, not meeting PO needs yet   Plan: Continue TPN at 65 mL/hr Nutritional Components Amino acids (using 15% Clinisol): 106.1 grams Dextrose: 265.2 grams Lipids (using 20% SMOFlipids): 54.6 grams kCal: 1872/24h  Electrolytes in TPN (standard): Na 69mq/L, K 558m/L, Ca 23m30mL, Mg 10 mEq/L , and Phos 10 mmol/L. Cl:Ac 1:1 Enteral multivitamin. Added thiamine '100mg'$  back to TPN as B1 level was subtherapeutic. Holding trace elements and chromium per secure chat with RD. Continue Zinc 15 mg.  Continue moderate SSI  TIDM + CBG 4x/day (ACHS) + 10u insulin in TPN Monitor TPN labs on Mon/Thurs   Thank you for allowing pharmacy to be a part of this patient's care.   [name] Clinical Pharmacist 05/30/2022 3:54 PM

## 2022-05-31 LAB — GLUCOSE, CAPILLARY
Glucose-Capillary: 125 mg/dL — ABNORMAL HIGH (ref 70–99)
Glucose-Capillary: 121 mg/dL — ABNORMAL HIGH (ref 70–99)
Glucose-Capillary: 122 mg/dL — ABNORMAL HIGH (ref 70–99)
Glucose-Capillary: 96 mg/dL (ref 70–99)

## 2022-05-31 MED ORDER — MAGNESIUM SULFATE 2 GM/50ML IV SOLN
2.0000 g | Freq: Once | INTRAVENOUS | Status: AC
  Administered 2022-05-31: 2 g via INTRAVENOUS
  Filled 2022-05-31: qty 50

## 2022-05-31 MED ORDER — ZINC CHLORIDE 1 MG/ML IV SOLN
INTRAVENOUS | Status: AC
  Filled 2022-05-31: qty 707.2

## 2022-05-31 NOTE — Progress Notes (Signed)
EC fistula No new issues Taking po  1.4 lts from [name]  PE NAD [name] pouch w/o leak.  A/p Start transitioning TPN to night infusion so she can mobilize more during the day Continue current care

## 2022-05-31 NOTE — Plan of Care (Signed)
  Problem: Nutrition: Goal: Adequate nutrition will be maintained Outcome: Progressing   Problem: Activity: Goal: Risk for activity intolerance will decrease Outcome: Progressing   Problem: Nutrition: Goal: Adequate nutrition will be maintained Outcome: Progressing   Problem: Coping: Goal: Level of anxiety will decrease Outcome: Progressing

## 2022-05-31 NOTE — Progress Notes (Signed)
PHARMACY - TOTAL PARENTERAL NUTRITION CONSULT NOTE   Indication: Prolonged ileus  Patient Measurements: Height: '4\' 11"'$  (149.9 cm) Weight: 95.9 kg (211 lb 6.7 oz) IBW/kg (Calculated) : 43.2 TPN AdjBW (KG): 55.2 Body mass index is 42.7 kg/m.  Assessment: Debra [name] is a 56 y.o. female s/p laparotomy, excision of greater omental mass, abdominal wall reconstruction with [name], appendectomy, and placement of Prevena vac.  Glucose / Insulin: No apparent history of diabetes. BG 118-142, 6 units of SSI moderate over last 24 hrs + 10 units insulin in TPN bag Electrolytes: Na 138, Mag 1.8, Ca 8.3, alb 2.7, Phos 3.1, K 4.1 ; overall unchanged from previous collection Patient tends to have Mag run <2.0 and already have max amount Mag in TPN Renal: Scr 0.36 (baseline 0.3-0.6) Hepatic: No transaminitis. LFTs within normal limits. TG within normal limits  GI Imaging: 9/11 CTAP: no new acute issues GI Surgeries / Procedures: s/p laparotomy, excision of greater omental mass, abdominal wall reconstruction with [name], appendectomy, and placement of Prevena vac  Central access: 02/15/22 TPN start date: 02/15/22  Nutritional Goals: Goal TPN rate of 65 mL/hr (provides 106 g of protein and 1872 kcals per day)  RD Assessment:  Estimated Needs Total Energy Estimated Needs: 1800-2100kcal/day Total Protein Estimated Needs: 90-110g/d Total Fluid Estimated Needs: 1.4-1.6L/day  Current Nutrition:  Soft diet + nutritional supplements, not meeting PO needs yet   Plan: Continue TPN at 65 mL/hr Nutritional Components Amino acids (using 15% Clinisol): 106.1 grams Dextrose: 265.2 grams Lipids (using 20% SMOFlipids): 54.6 grams kCal: 1872/24h  Electrolytes in TPN (standard): Na 69mq/L, K 577m/L, Ca 31m13mL, Mg 10 mEq/L , and Phos 10 mmol/L. Cl:Ac 1:1 Mag 1.8 on 9/23, will replace with Magnesium sulfate 2 gm IV x1 Pt's mag will drop <2.0. Need to replace outside TPN  [facility]. Added thiamine '100mg'$  back to TPN as B1 level was subtherapeutic. Holding trace elements and chromium per secure chat with RD. Continue Zinc 15 mg.  Continue moderate SSI TIDM + CBG 4x/day (ACHS) + 10u insulin in TPN Monitor TPN labs on Mon/Thurs   Thank you for allowing pharmacy to be a part of this patient's care.   MerNoralee SpaceharmD Clinical Pharmacist 05/31/2022 8:03 AM

## 2022-06-01 LAB — GLUCOSE, CAPILLARY
Glucose-Capillary: 154 mg/dL — ABNORMAL HIGH (ref 70–99)
Glucose-Capillary: 131 mg/dL — ABNORMAL HIGH (ref 70–99)
Glucose-Capillary: 117 mg/dL — ABNORMAL HIGH (ref 70–99)
Glucose-Capillary: 115 mg/dL — ABNORMAL HIGH (ref 70–99)

## 2022-06-01 MED ORDER — INSULIN ASPART 100 UNIT/ML IJ SOLN
0.0000 [IU] | INTRAMUSCULAR | Status: DC
  Administered 2022-06-02: 2 [IU] via SUBCUTANEOUS
  Filled 2022-06-01: qty 1

## 2022-06-01 MED ORDER — ZINC CHLORIDE 1 MG/ML IV SOLN
INTRAVENOUS | Status: AC
  Filled 2022-06-01: qty 608.33

## 2022-06-01 NOTE — Progress Notes (Signed)
PHARMACY - TOTAL PARENTERAL NUTRITION CONSULT NOTE   Indication: Prolonged ileus  Patient Measurements: Height: '4\' 11"'$  (149.9 cm) Weight: 97.2 kg (214 lb 4.6 oz) IBW/kg (Calculated) : 43.2 TPN AdjBW (KG): 55.2 Body mass index is 43.28 kg/m.  Assessment: Debra [name] is a 56 y.o. female s/p laparotomy, excision of greater omental mass, abdominal wall reconstruction with [name], appendectomy, and placement of Prevena vac.  Glucose / Insulin: No apparent history of diabetes. BG 96-154, 6 units of SSI moderate over last 24 hrs + 10 units insulin in TPN bag Electrolytes:  Patient tends to have Mag run <2.0 and already have max amount Mag in TPN -replace outside of TPN when needed Renal: Scr 0.36 (baseline 0.3-0.6) Hepatic: No transaminitis. LFTs within normal limits. TG within normal limits  GI Imaging: 9/11 CTAP: no new acute issues GI Surgeries / Procedures: s/p laparotomy, excision of greater omental mass, abdominal wall reconstruction with [name], appendectomy, and placement of Prevena vac  Central access: 02/15/22 TPN start date: 02/15/22  Nutritional Goals: Goal cyclic TPN over 14 hrs: Start Day 1 with cyclic TPN over 18 hrs: (provides 91 g of protein and 1622 kcals per day) total volume over 18hrs for calculations=1250 ml  RD Assessment:  Estimated Needs Total Energy Estimated Needs: 1800-2100kcal/day Total Protein Estimated Needs: 90-110g/d Total Fluid Estimated Needs: 1.4-1.6L/day  Current Nutrition:  Soft diet + nutritional supplements, not meeting PO needs yet   Plan: Transition to *Cyclic* TPN on 6/38. Run over 18 hrs for 1st day. If tolerates then change to run over 14 hours after that per MD.  Day 1 (9/24):  Start rate at 37 mL/hr for 1 hours.   Increase rate to 74 mL/hr for 16 hours.   Decrease rate to 37 mL/hr for 1 hours, then stop.    Nutritional Components Amino acids (using 15% Clinisol): 91grams Dextrose 19%   Lipids (using 20% SMOFlipids): 36 grams kCal: 1622/24h  Electrolytes in TPN (standard): Na 69mq/L, K 510m/L, Ca 69m27mL, Mg 10 mEq/L , and Phos 10 mmol/L. Cl:Ac 1:1 Pt's mag will drop <2.0. watch. [facility]. Added thiamine '100mg'$  back to TPN as B1 level was subtherapeutic. Holding trace elements and chromium per secure chat with RD. Continue Zinc 5 mg. (15 mg flagged as too high?) New CBG/SSI for Cyclic TPN:SSI  -CBG 2 hrs after cyclic TPN start -CBG during middle of cyclic TPN infusion -CBG 1 hr after cylic TPN stopped -CBG while off TPN (=4 CBGs per 24 hr period) + 10u insulin in TPN  Check labs 9/25-9/27 at least, due to change in TPN,  then Monitor TPN labs on Mon/Thurs   Thank you for allowing pharmacy to be a part of this patient's care.   MerNoralee SpaceharmD Clinical Pharmacist 06/01/2022 11:15 AM

## 2022-06-01 NOTE — Progress Notes (Signed)
EC fistula No new issues We will start cycling TPN at night May need to decrease Dextrose on TPN ( 19 % is on the high side)

## 2022-06-01 NOTE — Plan of Care (Signed)
  Problem: Clinical Measurements: Goal: Will remain free from infection Outcome: Progressing   Problem: Nutrition: Goal: Adequate nutrition will be maintained Outcome: Progressing   Problem: Activity: Goal: Risk for activity intolerance will decrease Outcome: Progressing   Problem: Nutrition: Goal: Adequate nutrition will be maintained Outcome: Progressing   Problem: Coping: Goal: Level of anxiety will decrease Outcome: Progressing   Problem: Pain Managment: Goal: General experience of comfort will improve Outcome: Progressing   

## 2022-06-02 LAB — COMPREHENSIVE METABOLIC PANEL
ALT: 18 U/L (ref 0–44)
AST: 26 U/L (ref 15–41)
Albumin: 2.7 g/dL — ABNORMAL LOW (ref 3.5–5.0)
Alkaline Phosphatase: 118 U/L (ref 38–126)
Anion gap: 3 — ABNORMAL LOW (ref 5–15)
BUN: 18 mg/dL (ref 6–20)
CO2: 25 mmol/L (ref 22–32)
Calcium: 8.1 mg/dL — ABNORMAL LOW (ref 8.9–10.3)
Chloride: 106 mmol/L (ref 98–111)
Creatinine, Ser: 0.42 mg/dL — ABNORMAL LOW (ref 0.44–1.00)
GFR, Estimated: >60 mL/min (ref >60)
Glucose, Bld: 145 mg/dL — ABNORMAL HIGH (ref 70–99)
Potassium: 4.1 mmol/L (ref 3.5–5.1)
Sodium: 134 mmol/L — ABNORMAL LOW (ref 135–145)
Total Bilirubin: 0.6 mg/dL (ref 0.3–1.2)
Total Protein: 6.5 g/dL (ref 6.5–8.1)

## 2022-06-02 LAB — MAGNESIUM: Magnesium: 1.8 mg/dL (ref 1.7–2.4)

## 2022-06-02 LAB — GLUCOSE, CAPILLARY
Glucose-Capillary: 124 mg/dL — ABNORMAL HIGH (ref 70–99)
Glucose-Capillary: 94 mg/dL (ref 70–99)
Glucose-Capillary: 108 mg/dL — ABNORMAL HIGH (ref 70–99)
Glucose-Capillary: 127 mg/dL — ABNORMAL HIGH (ref 70–99)
Glucose-Capillary: 143 mg/dL — ABNORMAL HIGH (ref 70–99)

## 2022-06-02 LAB — PHOSPHORUS: Phosphorus: 3.6 mg/dL (ref 2.5–4.6)

## 2022-06-02 LAB — TRIGLYCERIDES: Triglycerides: 71 mg/dL (ref <150)

## 2022-06-02 MED ORDER — ZINC CHLORIDE 1 MG/ML IV SOLN
INTRAVENOUS | Status: AC
  Filled 2022-06-02: qty 681.33

## 2022-06-02 MED ORDER — MAGNESIUM SULFATE 2 GM/50ML IV SOLN
2.0000 g | Freq: Once | INTRAVENOUS | Status: AC
  Administered 2022-06-02: 2 g via INTRAVENOUS
  Filled 2022-06-02: qty 50

## 2022-06-02 MED ORDER — INSULIN ASPART 100 UNIT/ML IJ SOLN
0.0000 [IU] | INTRAMUSCULAR | Status: DC
  Administered 2022-06-02: 2 [IU] via SUBCUTANEOUS
  Administered 2022-06-03 (×2): 3 [IU] via SUBCUTANEOUS
  Filled 2022-06-02 (×3): qty 1

## 2022-06-02 NOTE — Progress Notes (Signed)
[facility] SURGICAL PROGRESS NOTE (cpt [phone])  Hospital Day(s): 109.   Post op day(s): 107 Days Post-Op.   Interval History:  Patient seen and examined no acute events or new complaints overnight.  Nutritional labs reassuring [employer]; working well; no issues today No abdominal pain; nausea, emesis, fevers [name] Pouch output unmeasured in last 24 hours She is on soft; on TPN Ambulating independently   Review of Systems:  Constitutional: denies fever, chills  HEENT: denies cough or congestion  Respiratory: denies any shortness of breath  Cardiovascular: denies chest pain or palpitations  Gastrointestinal: denies abdominal pain, N/V Genitourinary: denies burning with urination or urinary frequency Integumentary: + midline wound  Vital signs in last 24 hours: [min-max] current  Temp:  [98.4 F (36.9 C)-99.1 F (37.3 C)] 99.1 F (37.3 C) (09/25 0314) Pulse Rate:  [67] 67 (09/25 0314) Resp:  [18] 18 (09/25 0314) BP: (118-121)/(71-86) 118/71 (09/25 0314) SpO2:  [92 %-99 %] 99 % (09/25 0314) Weight:  [97.8 kg] 97.8 kg (09/25 0314)     Height: '4\' 11"'$  (149.9 cm) Weight: 97.8 kg BMI (Calculated): 43.04   Intake/Output last 2 shifts:  09/24 0701 - 09/25 0700 In: 720.8 [I.V.:720.8] Out: -    Physical Exam:  Constitutional: alert, cooperative and no distress  Respiratory: breathing non-labored at rest  Cardiovascular: regular rate and sinus rhythm  Gastrointestinal: Soft, abdominal soreness on the right, non-distended, no rebound/guarding. Integumentary: Midline wound open, peritoneum closed; granulating;  there are three stomatized areas visible in the LUQ and LLQ portions of the wound, output remains feculent. There is hypergranulation tissue to the inferior wound edge. No further bleeding    Labs:     Latest Ref Rng & Units 05/29/2022    7:17 PM 05/29/2022    4:52 AM 05/26/2022    5:09 AM  CBC  WBC 4.0 - 10.5 K/uL  3.1  3.2   Hemoglobin 12.0 -  15.0 g/dL 9.3  6.8  8.3   Hematocrit 36.0 - 46.0 % 30.3  22.2  27.3   Platelets 150 - 400 K/uL  70  90       Latest Ref Rng & Units 06/02/2022    5:56 AM 05/29/2022    4:52 AM 05/26/2022    5:09 AM  CMP  Glucose 70 - 99 mg/dL 145  130  143   BUN 6 - 20 mg/dL '18  19  19   '$ Creatinine 0.44 - 1.00 mg/dL 0.42  0.36  0.41   Sodium 135 - 145 mmol/L 134  135  138   Potassium 3.5 - 5.1 mmol/L 4.1  4.1  3.9   Chloride 98 - 111 mmol/L 106  107  108   CO2 22 - 32 mmol/L '25  24  26   '$ Calcium 8.9 - 10.3 mg/dL 8.1  8.3  8.2   Total Protein 6.5 - 8.1 g/dL 6.5  6.2  6.5   Total Bilirubin 0.3 - 1.2 mg/dL 0.6  0.4  0.2   Alkaline Phos 38 - 126 U/L 118  113  116   AST 15 - 41 U/L '26  23  29   '$ ALT 0 - 44 U/L '18  17  14     '$ Imaging studies: No new pertinent imaging studies   Assessment/Plan:  56 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   - New 09/25: No new issues   - Wound Care: [name] pouch in place; now with windowless pouch. Red rubber to suction; ensure this is not in wound itself. These pouches need changed every 7-10 days (ideally). Changed 09/18; would extend life of current pouch system as long as feasible given limited supply of these   - On Soft diet + nutritional supplementation - Continue TPN; transition to QHS - Appreciate dietary assistance with trace elements; iron supplementation            - Monitor abdominal examination; on-going bowel function            - Pain control prn; antemetic prn            - Out of bed; discharged from therapies; no recommendations at this time   - Discharge Planning: Anticipate lengthy admission and potential eventual transfer.    All of the above findings and recommendations were  discussed with the patient, and the medical team, and all of patient's questions were answered to her expressed satisfaction.  -- [name], PA-C [facility] 06/02/2022, 7:43 AM M-F: 7am - 4pm

## 2022-06-02 NOTE — Progress Notes (Signed)
PHARMACY - TOTAL PARENTERAL NUTRITION CONSULT NOTE   Indication: Prolonged ileus  Patient Measurements: Height: '4\' 11"'$  (149.9 cm) Weight: 97.8 kg (215 lb 9.8 oz) IBW/kg (Calculated) : 43.2 TPN AdjBW (KG): 55.2 Body mass index is 43.55 kg/m.  Assessment: Debra [name] is a 56 y.o. female s/p laparotomy, excision of greater omental mass, abdominal wall reconstruction with [name], appendectomy, and placement of Prevena vac.  Glucose / Insulin: No apparent history of diabetes. BG 96-154, 6 units of SSI moderate over last 24 hrs + 10 units insulin in TPN bag Electrolytes:  Patient tends to have Mag run <2.0 and already have max amount Mag in TPN -replace outside of TPN when needed Renal: Scr 0.36 (baseline 0.3-0.6) Hepatic: No transaminitis. LFTs within normal limits. TG within normal limits  GI Imaging: 9/11 CTAP: no new acute issues GI Surgeries / Procedures: s/p laparotomy, excision of greater omental mass, abdominal wall reconstruction with [name], appendectomy, and placement of Prevena vac  Central access: 02/15/22 TPN start date: 02/15/22  Nutritional Goals: Goal cyclic TPN over 14 hrs: Start Day 1 with cyclic TPN over 18 hrs: (provides 91 g of protein and 1622 kcals per day) total volume over 18hrs for calculations=1250 ml  RD Assessment:  Estimated Needs Total Energy Estimated Needs: 1800-2100kcal/day Total Protein Estimated Needs: 90-110g/d Total Fluid Estimated Needs: 1.4-1.6L/day  Current Nutrition:  Soft diet + nutritional supplements, not meeting PO needs yet   Plan: Transition to *Cyclic* TPN on 2/56. Run over 18 hrs for 1st day. If tolerates then change to run over 14 hours after that per MD.  Day 2 (9/25):  Start rate at 54 mL/hr for 1 hours.   Increase rate to 108 mL/hr for 16 hours.   Decrease rate to 54 mL/hr for 1 hours, then stop.    Nutritional Components Amino acids (using 15% Clinisol): 102grams Dextrose 19% =  266 g Lipids (using 20% SMOFlipids): 50.4 grams kCal: 1817/24h  Electrolytes in TPN (standard): Na 52mq/L, K 538m/L, Ca 49m67mL, Mg 10 mEq/L , and Phos 10 mmol/L. Cl:Ac 1:1 Pt's mag will drop <2.0. watch. Need to replace outside TPN [facility]. Added thiamine '100mg'$  back to TPN as B1 level was subtherapeutic. Holding trace elements and chromium per secure chat with RD. Continue Zinc '15mg'$  New CBG/SSI for Cyclic TPN:SSI  -CBG 2 hrs after cyclic TPN start -CBG during middle of cyclic TPN infusion -CBG 1 hr after cylic TPN stopped -CBG while off TPN (=4 CBGs per 24 hr period) + 10u insulin in TPN  Check labs 9/25-9/27 at least, due to change in TPN,  then Monitor TPN labs on Mon/Thurs   Thank you for allowing pharmacy to be a part of this patient's care.   [name] Clinical Pharmacist 06/02/2022 4:16 PM

## 2022-06-02 NOTE — Progress Notes (Signed)
Nutrition Follow-up  DOCUMENTATION CODES:   Obesity unspecified  INTERVENTION:   Continue TPN per pharmacy- provides 1817kcal/day and 102g/day protein   Continue Zinc 52m daily added to TPN   Ensure Enlive po TID, each supplement provides 350 kcal and 20 grams of protein.  MVI po daily   Daily weights   Vitamin D3- 2000 units po daily   Vitamin A 10,000 units po daily   Thiamine 1068mdaily in TPN   NUTRITION DIAGNOSIS:   Increased nutrient needs related to wound healing, catabolic illness as evidenced by estimated needs.  GOAL:   Patient will meet greater than or equal to 90% of their needs -met with TPN   MONITOR:   PO intake, Supplement acceptance, Labs, Weight trends, Diet advancement, I & O's, TPN  ASSESSMENT:   [age]/o female with h/o hypothyroidism, COVID 19 (3/21), kidney stones and stage 3 colon cancer (s/p left hemicolectomy 5/21 and chemotherapy) who is admitted for new pelvic mass now s/p laparotomy 6/8 (with excision of pelvic mass from greater omentum, abdominal wall reconstruction with bilateral myocutaneous flaps and mesh, incisional hernia repair, appendectomy repair and VAC placement) complicated by bowel perforation s/p reopening of recent laparotomy 6/10 (with repair of small bowel perforation, excision of mesh, placement of two phasix mesh and VAC placement). Pathology returned as metastatic adenocarcinoma.   TPN changed to cyclic over the weekend (14 hours). Pt tolerating TPN at goal rate. Refeed labs stable. Triglycerides wnl. Hyperglycemia resolved. Insulin remains in TPN. Trace elements on hold. Pt is receiving MVI po daily. Vitamins returned low and are being supplemented. Pt remains on a soft diet; pt eating <25% of meals. Pt is drinking some Ensure. Per chart, pt appears weight stable since admission. [name] pouch remains in place with no measured output documented. Will plan to recheck trace elements next week and vitamins labs after 30 days of  supplementation.    Medications reviewed and include: D3, lovenox, insulin, L-glutamine, synthroid, imodium, MVI, protonix, carafate, vitamin A, zinc (in TPN), thiamine (in TPN)  -Selenium 220 wnl, Chromium 2.8(H), Manganese 27.7(H), copper 124 wnl, zinc 38.0(L)- 9/7 -Vitamin B1- 59.6(L), Vitamin D 24.10(L), vitamin A 18.5(L), B12 256 wnl, B6 13.3 wnl, vitamin E 11.7 wnl, vitamin K 0.32 wnl- 9/14  Labs reviewed: Na 134(L), K 4.1 wnl, creat 0.42(L), P 3.6 wnl, Mg 1.8 wnl Triglycerides- 71 Iron 26(L), TIBC 328- 8/24 Ferritin- 11.0 wnl, folate 13.2 wnl- 9/14 Hgb 9.3(L), Hct 30.3(L) Cbgs-  124, 115, 117, 131, 154 x 24 hrs  Diet Order:    Diet Order             DIET SOFT Room service appropriate? Yes; Fluid consistency: Thin  Diet effective now                  EDUCATION NEEDS:   Not appropriate for education at this time  Skin:  Skin Assessment: Reviewed RN Assessment ( Midline wound: 15cm x 10cm x 6cm )  Last BM:  9/24- TYPE 7  Height:   Ht Readings from Last 1 Encounters:  03/01/22 _0  (1.499 m)    Weight:   Wt Readings from Last 1 Encounters:  06/02/22 97.8 kg    Ideal Body Weight:  44.3 kg  BMI:  Body mass index is 43.55 kg/m.  Estimated Nutritional Needs:   Kcal:  1800-2100kcal/day  Protein:  90-110g/d  Fluid:  1.4-1.6L/day  CaKoleen DistanceS, RD, LDN Please refer to [facility] and/or RD on-call/weekend/after hours pager

## 2022-06-03 LAB — COMPREHENSIVE METABOLIC PANEL
ALT: 17 U/L (ref 0–44)
AST: 26 U/L (ref 15–41)
Albumin: 2.7 g/dL — ABNORMAL LOW (ref 3.5–5.0)
Alkaline Phosphatase: 117 U/L (ref 38–126)
Anion gap: 4 — ABNORMAL LOW (ref 5–15)
BUN: 19 mg/dL (ref 6–20)
CO2: 26 mmol/L (ref 22–32)
Calcium: 8.3 mg/dL — ABNORMAL LOW (ref 8.9–10.3)
Chloride: 105 mmol/L (ref 98–111)
Creatinine, Ser: 0.45 mg/dL (ref 0.44–1.00)
GFR, Estimated: >60 mL/min (ref >60)
Glucose, Bld: 140 mg/dL — ABNORMAL HIGH (ref 70–99)
Potassium: 4.2 mmol/L (ref 3.5–5.1)
Sodium: 135 mmol/L (ref 135–145)
Total Bilirubin: 0.6 mg/dL (ref 0.3–1.2)
Total Protein: 6.5 g/dL (ref 6.5–8.1)

## 2022-06-03 LAB — GLUCOSE, CAPILLARY
Glucose-Capillary: 160 mg/dL — ABNORMAL HIGH (ref 70–99)
Glucose-Capillary: 151 mg/dL — ABNORMAL HIGH (ref 70–99)
Glucose-Capillary: 104 mg/dL — ABNORMAL HIGH (ref 70–99)
Glucose-Capillary: 125 mg/dL — ABNORMAL HIGH (ref 70–99)

## 2022-06-03 LAB — PHOSPHORUS: Phosphorus: 3.5 mg/dL (ref 2.5–4.6)

## 2022-06-03 LAB — MAGNESIUM: Magnesium: 1.9 mg/dL (ref 1.7–2.4)

## 2022-06-03 MED ORDER — INSULIN ASPART 100 UNIT/ML IJ SOLN
0.0000 [IU] | INTRAMUSCULAR | Status: DC
  Administered 2022-06-03: 2 [IU] via SUBCUTANEOUS
  Administered 2022-06-04: 5 [IU] via SUBCUTANEOUS
  Administered 2022-06-04 – 2022-06-05 (×2): 2 [IU] via SUBCUTANEOUS
  Administered 2022-06-05: 3 [IU] via SUBCUTANEOUS
  Administered 2022-06-06 – 2022-06-07 (×3): 2 [IU] via SUBCUTANEOUS
  Administered 2022-06-07 – 2022-06-08 (×2): 3 [IU] via SUBCUTANEOUS
  Administered 2022-06-08 – 2022-06-10 (×5): 2 [IU] via SUBCUTANEOUS
  Administered 2022-06-10 – 2022-06-11 (×3): 3 [IU] via SUBCUTANEOUS
  Administered 2022-06-11 (×2): 2 [IU] via SUBCUTANEOUS
  Administered 2022-06-12 (×2): 3 [IU] via SUBCUTANEOUS
  Administered 2022-06-12 – 2022-06-13 (×2): 2 [IU] via SUBCUTANEOUS
  Administered 2022-06-13: 3 [IU] via SUBCUTANEOUS
  Administered 2022-06-13: 2 [IU] via SUBCUTANEOUS
  Administered 2022-06-13: 3 [IU] via SUBCUTANEOUS
  Administered 2022-06-14 (×2): 2 [IU] via SUBCUTANEOUS
  Administered 2022-06-14 – 2022-06-16 (×5): 3 [IU] via SUBCUTANEOUS
  Administered 2022-06-16 – 2022-06-17 (×3): 2 [IU] via SUBCUTANEOUS
  Administered 2022-06-17: 3 [IU] via SUBCUTANEOUS
  Administered 2022-06-17 – 2022-06-18 (×3): 2 [IU] via SUBCUTANEOUS
  Administered 2022-06-18: 3 [IU] via SUBCUTANEOUS
  Administered 2022-06-19: 2 [IU] via SUBCUTANEOUS
  Administered 2022-06-19 – 2022-06-20 (×2): 3 [IU] via SUBCUTANEOUS
  Administered 2022-06-20: 2 [IU] via SUBCUTANEOUS
  Administered 2022-06-21 (×2): 3 [IU] via SUBCUTANEOUS
  Administered 2022-06-21 – 2022-06-22 (×3): 2 [IU] via SUBCUTANEOUS
  Administered 2022-06-22 – 2022-06-23 (×3): 3 [IU] via SUBCUTANEOUS
  Administered 2022-06-23 – 2022-06-24 (×2): 2 [IU] via SUBCUTANEOUS
  Administered 2022-06-24 – 2022-06-25 (×2): 3 [IU] via SUBCUTANEOUS
  Administered 2022-06-25: 2 [IU] via SUBCUTANEOUS
  Administered 2022-06-26: 3 [IU] via SUBCUTANEOUS
  Administered 2022-06-26: 2 [IU] via SUBCUTANEOUS
  Administered 2022-06-26: 3 [IU] via SUBCUTANEOUS
  Administered 2022-06-27 (×2): 2 [IU] via SUBCUTANEOUS
  Administered 2022-06-28 – 2022-06-30 (×6): 3 [IU] via SUBCUTANEOUS
  Administered 2022-06-30: 2 [IU] via SUBCUTANEOUS
  Administered 2022-06-30: 7 [IU] via SUBCUTANEOUS
  Administered 2022-07-01: 2 [IU] via SUBCUTANEOUS
  Administered 2022-07-01: 3 [IU] via SUBCUTANEOUS
  Administered 2022-07-01 – 2022-07-02 (×2): 2 [IU] via SUBCUTANEOUS
  Administered 2022-07-02 (×2): 3 [IU] via SUBCUTANEOUS
  Administered 2022-07-03: 2 [IU] via SUBCUTANEOUS
  Administered 2022-07-03: 3 [IU] via SUBCUTANEOUS
  Administered 2022-07-04 (×2): 2 [IU] via SUBCUTANEOUS
  Administered 2022-07-04 (×2): 3 [IU] via SUBCUTANEOUS
  Administered 2022-07-05: 2 [IU] via SUBCUTANEOUS
  Administered 2022-07-05: 3 [IU] via SUBCUTANEOUS
  Administered 2022-07-06: 2 [IU] via SUBCUTANEOUS
  Administered 2022-07-06: 3 [IU] via SUBCUTANEOUS
  Administered 2022-07-06: 2 [IU] via SUBCUTANEOUS
  Administered 2022-07-06 – 2022-07-07 (×2): 3 [IU] via SUBCUTANEOUS
  Administered 2022-07-07 (×2): 2 [IU] via SUBCUTANEOUS
  Administered 2022-07-08 (×2): 3 [IU] via SUBCUTANEOUS
  Administered 2022-07-08: 2 [IU] via SUBCUTANEOUS
  Administered 2022-07-09: 3 [IU] via SUBCUTANEOUS
  Administered 2022-07-09: 2 [IU] via SUBCUTANEOUS
  Administered 2022-07-10: 3 [IU] via SUBCUTANEOUS
  Administered 2022-07-10: 5 [IU] via SUBCUTANEOUS
  Administered 2022-07-10: 3 [IU] via SUBCUTANEOUS
  Administered 2022-07-11: 5 [IU] via SUBCUTANEOUS
  Administered 2022-07-11: 3 [IU] via SUBCUTANEOUS
  Administered 2022-07-11: 2 [IU] via SUBCUTANEOUS
  Administered 2022-07-12 – 2022-07-13 (×4): 5 [IU] via SUBCUTANEOUS
  Administered 2022-07-13 (×2): 3 [IU] via SUBCUTANEOUS
  Administered 2022-07-14: 5 [IU] via SUBCUTANEOUS
  Filled 2022-06-03 (×113): qty 1

## 2022-06-03 MED ORDER — ZINC CHLORIDE 1 MG/ML IV SOLN
INTRAVENOUS | Status: AC
  Filled 2022-06-03: qty 681.33

## 2022-06-03 NOTE — Plan of Care (Signed)
  Problem: Clinical Measurements: Goal: Will remain free from infection Outcome: Progressing   Problem: Nutrition: Goal: Adequate nutrition will be maintained Outcome: Progressing   Problem: Activity: Goal: Risk for activity intolerance will decrease Outcome: Progressing   Problem: Nutrition: Goal: Adequate nutrition will be maintained Outcome: Progressing   Problem: Coping: Goal: Level of anxiety will decrease Outcome: Progressing   Problem: Pain Managment: Goal: General experience of comfort will improve Outcome: Progressing   

## 2022-06-03 NOTE — Consult Note (Signed)
WOC Nurse Consult Note: [name] pouching system with access window placed last night is intact without continuous suction. Periodic suction with Yankauer being performed effectively by Nursing. Two pouches without window taken to room for next pouch change. Pattern is taped to wall by supplies.  [place] nursing team will follow, and will remain available to this patient, the nursing, surgical and medical teams.    Maudie Flakes, MSN, RN, CNS, [place], Serita Grammes, [employer], [employer] phone:  [phone]

## 2022-06-03 NOTE — Progress Notes (Signed)
[facility] SURGICAL PROGRESS NOTE (cpt [phone])  Hospital Day(s): 110.   Post op day(s): 108 Days Post-Op.   Interval History:  Patient seen and examined Overnight, [name] leaked/failure. Replaced by RN with window pouch WITHOUT red rubber; intermittent suction with Yankauer.  No abdominal pain; nausea, emesis, fevers [name] Pouch output 700 ccs+ unmeasured in last 24 hours She is on soft; on TPN, now nightly Ambulating independently   Review of Systems:  Constitutional: denies fever, chills  HEENT: denies cough or congestion  Respiratory: denies any shortness of breath  Cardiovascular: denies chest pain or palpitations  Gastrointestinal: denies abdominal pain, N/V Genitourinary: denies burning with urination or urinary frequency Integumentary: + midline wound  Vital signs in last 24 hours: [min-max] current  Temp:  [97.9 F (36.6 C)-98.4 F (36.9 C)] 98.4 F (36.9 C) (09/26 0430) Pulse Rate:  [66-87] 87 (09/26 0430) Resp:  [16-20] 20 (09/26 0430) BP: (101-112)/(57-61) 108/58 (09/26 0430) SpO2:  [96 %-100 %] 98 % (09/26 0430) Weight:  [97.7 kg] 97.7 kg (09/26 0500)     Height: '4\' 11"'$  (149.9 cm) Weight: 97.7 kg BMI (Calculated): 43.04   Intake/Output last 2 shifts:  09/25 0701 - 09/26 0700 In: 1480.2 [P.O.:240; I.V.:1190.2; IV Piggyback:50] Out: 1100 [Drains:700]   Physical Exam:  Constitutional: alert, cooperative and no distress  Respiratory: breathing non-labored at rest  Cardiovascular: regular rate and sinus rhythm  Gastrointestinal: Soft, abdominal soreness on the right, non-distended, no rebound/guarding. Integumentary: Midline wound open, peritoneum closed; granulating;  there are three stomatized areas visible in the LUQ and LLQ portions of the wound, output remains feculent. There is hypergranulation tissue to the inferior wound edge. No further bleeding    Labs:     Latest Ref Rng & Units 05/29/2022    7:17 PM 05/29/2022    4:52 AM 05/26/2022     5:09 AM  CBC  WBC 4.0 - 10.5 K/uL  3.1  3.2   Hemoglobin 12.0 - 15.0 g/dL 9.3  6.8  8.3   Hematocrit 36.0 - 46.0 % 30.3  22.2  27.3   Platelets 150 - 400 K/uL  70  90       Latest Ref Rng & Units 06/02/2022    5:56 AM 05/29/2022    4:52 AM 05/26/2022    5:09 AM  CMP  Glucose 70 - 99 mg/dL 145  130  143   BUN 6 - 20 mg/dL '18  19  19   '$ Creatinine 0.44 - 1.00 mg/dL 0.42  0.36  0.41   Sodium 135 - 145 mmol/L 134  135  138   Potassium 3.5 - 5.1 mmol/L 4.1  4.1  3.9   Chloride 98 - 111 mmol/L 106  107  108   CO2 22 - 32 mmol/L '25  24  26   '$ Calcium 8.9 - 10.3 mg/dL 8.1  8.3  8.2   Total Protein 6.5 - 8.1 g/dL 6.5  6.2  6.5   Total Bilirubin 0.3 - 1.2 mg/dL 0.6  0.4  0.2   Alkaline Phos 38 - 126 U/L 118  113  116   AST 15 - 41 U/L '26  23  29   '$ ALT 0 - 44 U/L '18  17  14     '$ Imaging studies: No new pertinent imaging studies   Assessment/Plan:  56 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   - New 09/26: Recent [name] change (see below); Otherwise no issues    - Wound Care: [name] pouch changed overnight (09/25) secondary to failure. No windowless pouches available at that time. Now with "window" in place but WITHOUT red rubber. She will need intermittent suction with Yankauer. Likely be beneficial to suction before ambulation. Changed 09/25; would extend life of current pouch system as long as feasible; will reach out to [facility] RN regarding obtaining more "windowless" pouches if possible.    - On Soft diet + nutritional supplementation - Continue TPN; transition to QHS - Appreciate dietary assistance with trace elements; iron supplementation            - Monitor abdominal examination; on-going bowel function            -  Pain control prn; antemetic prn            - Out of bed; discharged from therapies; no recommendations at this time   - Discharge Planning: Anticipate lengthy admission and potential eventual transfer.    All of the above findings and recommendations were discussed with the patient, and the medical team, and all of patient's questions were answered to her expressed satisfaction.  -- Debra Simon, PA-C [facility] 06/03/2022, 7:29 AM M-F: 7am - 4pm

## 2022-06-03 NOTE — Progress Notes (Signed)
PHARMACY - TOTAL PARENTERAL NUTRITION CONSULT NOTE   Indication: Prolonged ileus  Patient Measurements: Height: '4\' 11"'$  (149.9 cm) Weight: 97.7 kg (215 lb 6.2 oz) IBW/kg (Calculated) : 43.2 TPN AdjBW (KG): 55.2 Body mass index is 43.5 kg/m.  Assessment: Debra [name] is a 56 y.o. female s/p laparotomy, excision of greater omental mass, abdominal wall reconstruction with [name], appendectomy, and placement of Prevena vac.  Glucose / Insulin: No apparent history of diabetes. BG 108-160, 5 units of SSI moderate over last 24 hrs + 10 units insulin in TPN bag Electrolytes:  Patient tends to have Mag run <2.0 and already have max amount Mag in TPN -replace outside of TPN when needed Renal: Scr 0.42 (baseline 0.3-0.6) Hepatic: No transaminitis. LFTs within normal limits. TG within normal limits  GI Imaging: 9/11 CTAP: no new acute issues GI Surgeries / Procedures: s/p laparotomy, excision of greater omental mass, abdominal wall reconstruction with [name], appendectomy, and placement of Prevena vac  Central access: 02/15/22 TPN start date: 02/15/22  Nutritional Goals: Goal cyclic TPN over 14 hrs: Start Day 1 with cyclic TPN over 18 hrs: (provides 91 g of protein and 1622 kcals per day) total volume over 14hrs for calculations=1400 ml(1500 ml total with overfill)  RD Assessment:  Estimated Needs Total Energy Estimated Needs: 1800-2100kcal/day Total Protein Estimated Needs: 90-110g/d Total Fluid Estimated Needs: 1.4-1.6L/day  Current Nutrition:  Soft diet + nutritional supplements, not meeting PO needs yet   Plan: Transitioned to *Cyclic* TPN on 8/41. Run over 18 hrs for 1st day, tolerated well, now changed to run over 14 hours  per MD.  Day 3 (9/26):  Start rate at 54 mL/hr for 1 hours.   Increase rate to 108 mL/hr for 16 hours.   Decrease rate to 54 mL/hr for 1 hours, then stop.    Nutritional Components Amino acids (using 15% Clinisol):  102grams Dextrose 19% = 266 g Lipids (using 20% SMOFlipids): 50.4 grams kCal: 1817/24h  Electrolytes in TPN (standard): Na 47mq/L, K 53m/L, Ca 59m19mL, Mg 10 mEq/L , and Phos 10 mmol/L. Cl:Ac 1:1 Pt's mag will drop <2.0. watch. Need to replace outside [facility]. Added thiamine '100mg'$  back to TPN as B1 level was subtherapeutic. Holding trace elements and chromium per secure chat with RD. Continue Zinc '15mg'$  New CBG/SSI for Cyclic TPN:SSI  -CBG 2 hrs after cyclic TPN start -CBG during middle of cyclic TPN infusion -CBG 1 hr after cylic TPN stopped -CBG while off TPN (=4 CBGs per 24 hr period) + 12u insulin in TPN(increased from 10u yesterday), continue to monitor  Check labs 9/25-9/27 at least, due to change in TPN,  then Monitor TPN labs on Mon/Thurs   Thank you for allowing pharmacy to be a part of this patient's care.   [name] Clinical Pharmacist 06/03/2022 11:42 AM

## 2022-06-04 LAB — BASIC METABOLIC PANEL
Anion gap: 4 — ABNORMAL LOW (ref 5–15)
BUN: 19 mg/dL (ref 6–20)
CO2: 24 mmol/L (ref 22–32)
Calcium: 8.2 mg/dL — ABNORMAL LOW (ref 8.9–10.3)
Chloride: 107 mmol/L (ref 98–111)
Creatinine, Ser: 0.47 mg/dL (ref 0.44–1.00)
GFR, Estimated: >60 mL/min (ref >60)
Glucose, Bld: 189 mg/dL — ABNORMAL HIGH (ref 70–99)
Potassium: 4.3 mmol/L (ref 3.5–5.1)
Sodium: 135 mmol/L (ref 135–145)

## 2022-06-04 LAB — GLUCOSE, CAPILLARY
Glucose-Capillary: 128 mg/dL — ABNORMAL HIGH (ref 70–99)
Glucose-Capillary: 221 mg/dL — ABNORMAL HIGH (ref 70–99)
Glucose-Capillary: 99 mg/dL (ref 70–99)
Glucose-Capillary: 110 mg/dL — ABNORMAL HIGH (ref 70–99)

## 2022-06-04 LAB — MAGNESIUM: Magnesium: 1.9 mg/dL (ref 1.7–2.4)

## 2022-06-04 LAB — PHOSPHORUS: Phosphorus: 3.3 mg/dL (ref 2.5–4.6)

## 2022-06-04 MED ORDER — ZINC CHLORIDE 1 MG/ML IV SOLN
INTRAVENOUS | Status: AC
  Filled 2022-06-04: qty 681.33

## 2022-06-04 NOTE — Progress Notes (Signed)
PHARMACY - TOTAL PARENTERAL NUTRITION CONSULT NOTE   Indication: Prolonged ileus  Patient Measurements: Height: '4\' 11"'$  (149.9 cm) Weight: 98.1 kg (216 lb 4.3 oz) IBW/kg (Calculated) : 43.2 TPN AdjBW (KG): 55.2 Body mass index is 43.68 kg/m.  Assessment: Debra [name] is a 56 y.o. female s/p laparotomy, excision of greater omental mass, abdominal wall reconstruction with [name], appendectomy, and placement of Prevena vac.  Glucose / Insulin: No apparent history of diabetes. BG 104-221, 7 units of SSI moderate over last 24 hrs + 12 units insulin in TPN bag Electrolytes:  Patient tends to have Mag run <2.0 and already have max amount Mag in TPN -replace outside of TPN when needed Renal: Scr 0.47 (baseline 0.3-0.6) Hepatic: No transaminitis. LFTs within normal limits. TG within normal limits  GI Imaging: 9/11 CTAP: no new acute issues GI Surgeries / Procedures: s/p laparotomy, excision of greater omental mass, abdominal wall reconstruction with [name], appendectomy, and placement of Prevena vac  Central access: 02/15/22 TPN start date: 02/15/22  Nutritional Goals: Goal cyclic TPN over 14 hrs: Start Day 1 with cyclic TPN over 18 hrs: (provides 91 g of protein and 1622 kcals per day) total volume over 14hrs for calculations=1400 ml(1500 ml total with overfill)  RD Assessment:  Estimated Needs Total Energy Estimated Needs: 1800-2100kcal/day Total Protein Estimated Needs: 90-110g/d Total Fluid Estimated Needs: 1.4-1.6L/day  Current Nutrition:  Soft diet + nutritional supplements, not meeting PO needs yet   Plan: Transitioned to *Cyclic* TPN on 7/68. Run over 18 hrs for 1st day, tolerated well, now changed to run over 14 hours  per MD.  Day 3 (9/26):  Start rate at 54 mL/hr for 1 hours.   Increase rate to 108 mL/hr for 16 hours.   Decrease rate to 54 mL/hr for 1 hours, then stop.    Nutritional Components Amino acids (using 15% Clinisol):  102grams Dextrose 19% = 266 g Lipids (using 20% SMOFlipids): 50.4 grams kCal: 1817/24h  Electrolytes in TPN (standard): Na 30mq/L, K 557m/L, Ca 61m761mL, Mg 10 mEq/L , and Phos 10 mmol/L. Cl:Ac 1:1 Pt's mag will drop <2.0. watch. Need to replace outside [facility]PN [facility]. Added thiamine '100mg'$  back to TPN as B1 level was subtherapeutic. Holding trace elements and chromium per secure chat with RD. Continue Zinc '15mg'$  New CBG/SSI for Cyclic TPN:SSI  -CBG 2 hrs after cyclic TPN start -CBG during middle of cyclic TPN infusion -CBG 1 hr after cylic TPN stopped -CBG while off TPN (=4 CBGs per 24 hr period) + 12u insulin in TPN, continue to monitor  Check labs 9/25-9/27 at least, due to change in TPN,  then Monitor TPN labs on Mon/Thurs   Thank you for allowing pharmacy to be a part of this patient's care.   [name] Clinical Pharmacist 06/04/2022 12:08 PM

## 2022-06-04 NOTE — Progress Notes (Signed)
[facility] SUR[facility]ICAL PROGRESS NOTE (cpt [phone])  Hospital Day(s): 111.   Post op day(s): 109 Days Post-Op.   Interval History:  Patient seen and examined No issues with current [name] system; RN staff doing a fantastic job managing effluent with Yankauer suction  [name] Pouch output 250 ccs+ unmeasured in last 24 hours She is on soft; on TPN, now nightly Ambulating independently   Review of Systems:  Constitutional: denies fever, chills  HEENT: denies cough or congestion  Respiratory: denies any shortness of breath  Cardiovascular: denies chest pain or palpitations  Gastrointestinal: denies abdominal pain, N/V Genitourinary: denies burning with urination or urinary frequency Integumentary: + midline wound  Vital signs in last 24 hours: [min-max] current  Temp:  [98.1 F (36.7 C)-98.7 F (37.1 C)] 98.1 F (36.7 C) (09/27 0307) Pulse Rate:  [80-90] 80 (09/27 0307) Resp:  [17-18] 17 (09/27 0307) BP: (111-134)/(65-79) 111/66 (09/27 0307) SpO2:  [94 %-100 %] 100 % (09/27 0307) Weight:  [98.1 kg] 98.1 kg (09/27 0500)     Height: '4\' 11"'$  (149.9 cm) Weight: 98.1 kg BMI (Calculated): 43.04   Intake/Output last 2 shifts:  09/26 0701 - 09/27 0700 In: 918.1 [P.O.:120; I.V.:798.1] Out: 250 [Drains:250]   Physical Exam:  Constitutional: alert, cooperative and no distress  Respiratory: breathing non-labored at rest  Cardiovascular: regular rate and sinus rhythm  Gastrointestinal: Soft, abdominal soreness on the right, non-distended, no rebound/guarding. Integumentary: Midline wound open, peritoneum closed; granulating;  there are three stomatized areas visible in the LUQ and LLQ portions of the wound, output remains feculent. There is hypergranulation tissue to the inferior wound edge. No further bleeding    Labs:     Latest Ref Rng & Units 05/29/2022    7:17 PM 05/29/2022    4:52 AM 05/26/2022    5:09 AM  CBC  WBC 4.0 - 10.5 K/uL  3.1  3.2   Hemoglobin 12.0 - 15.0  g/dL 9.3  6.8  8.3   Hematocrit 36.0 - 46.0 % 30.3  22.2  27.3   Platelets 150 - 400 K/uL  70  90       Latest Ref Rng & Units 06/03/2022   11:13 AM 06/02/2022    5:56 AM 05/29/2022    4:52 AM  CMP  Glucose 70 - 99 mg/dL 140  145  130   BUN 6 - 20 mg/dL '19  18  19   '$ Creatinine 0.44 - 1.00 mg/dL 0.45  0.42  0.36   Sodium 135 - 145 mmol/L 135  134  135   Potassium 3.5 - 5.1 mmol/L 4.2  4.1  4.1   Chloride 98 - 111 mmol/L 105  106  107   CO2 22 - 32 mmol/L '26  25  24   '$ Calcium 8.9 - 10.3 mg/dL 8.3  8.1  8.3   Total Protein 6.5 - 8.1 g/dL 6.5  6.5  6.2   Total Bilirubin 0.3 - 1.2 mg/dL 0.6  0.6  0.4   Alkaline Phos 38 - 126 U/L 117  118  113   AST 15 - 41 U/L '26  26  23   '$ ALT 0 - 44 U/L '17  18  17     '$ Imaging studies: No new pertinent imaging studies   Assessment/Plan:  56 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   - New 09/27: No issues   - Wound Care: [name] pouch changed overnight (09/25) secondary to failure. No windowless pouches available at that time. Now with "window" in place but WITHOUT red rubber. She will need intermittent suction with Yankauer. Likely be beneficial to suction before ambulation. Changed 09/25; would extend life of current pouch system as long as feasible. Windowless pouches at bedside again, use for next change....she will need red rubber suction with "windowless" pouches.    - On Soft diet + nutritional supplementation - Continue TPN; transition to QHS - Appreciate dietary assistance with trace elements; iron supplementation            - Monitor abdominal examination; on-going bowel function            - Pain control prn; antemetic prn            - Out of bed; discharged from therapies; no  recommendations at this time   - Discharge Planning: Anticipate lengthy admission and potential eventual transfer.    All of the above findings and recommendations were discussed with the patient, and the medical team, and all of patient's questions were answered to her expressed satisfaction.  -- [name], PA-C [facility] 06/04/2022, 7:14 AM M-F: 7am - 4pm

## 2022-06-04 NOTE — TOC Progression Note (Signed)
Transition of Care [facility]) - Progression Note    Patient Details  Name: Debra [name] MRN: [id number] Date of Birth: [birth year]  [facility]) CM/SW Contact  Beverly Sessions, RN Phone Number: 06/04/2022, 3:20 PM  Clinical Narrative:     Message sent to [facility] with surgery for update - per Zach patient was not accepted at [facility], and it will not be attempted for transfer at this time  Per Zach in order to go home  -Home Health RN  -TPN QHS  -[name] Pouches + Suction  At this time it is my understanding that LOG for TPN is not an option has there is no identified stop date, eakins pouches would be an out of pocket expense, and there is not a suction machine label for use of fistulas in order to be able to be obtained for home use  TOC supervisor added to secure chat so she is aware of barriers, message sent to [facility] director to discuss options  Per Thedore Mins anticipated it would be 2-3 before patient is medically ready for discharge   Expected Discharge Plan: [place] Barriers to Discharge: Continued Medical Work up  Expected Discharge Plan and Services Expected Discharge Plan: [place]                                               Social Determinants of Health (SDOH) Interventions    Readmission Risk Interventions     No data to display

## 2022-06-04 NOTE — Plan of Care (Signed)
  Problem: Clinical Measurements: Goal: Will remain free from infection Outcome: Progressing   Problem: Nutrition: Goal: Adequate nutrition will be maintained Outcome: Progressing   Problem: Activity: Goal: Risk for activity intolerance will decrease Outcome: Progressing   Problem: Nutrition: Goal: Adequate nutrition will be maintained Outcome: Progressing   

## 2022-06-05 LAB — COMPREHENSIVE METABOLIC PANEL
ALT: 17 U/L (ref 0–44)
AST: 25 U/L (ref 15–41)
Albumin: 3.1 g/dL — ABNORMAL LOW (ref 3.5–5.0)
Alkaline Phosphatase: 117 U/L (ref 38–126)
Anion gap: 4 — ABNORMAL LOW (ref 5–15)
BUN: 20 mg/dL (ref 6–20)
CO2: 24 mmol/L (ref 22–32)
Calcium: 8.5 mg/dL — ABNORMAL LOW (ref 8.9–10.3)
Chloride: 106 mmol/L (ref 98–111)
Creatinine, Ser: 0.49 mg/dL (ref 0.44–1.00)
GFR, Estimated: >60 mL/min (ref >60)
Glucose, Bld: 162 mg/dL — ABNORMAL HIGH (ref 70–99)
Potassium: 3.9 mmol/L (ref 3.5–5.1)
Sodium: 134 mmol/L — ABNORMAL LOW (ref 135–145)
Total Bilirubin: 0.5 mg/dL (ref 0.3–1.2)
Total Protein: 6.9 g/dL (ref 6.5–8.1)

## 2022-06-05 LAB — PHOSPHORUS: Phosphorus: 3.5 mg/dL (ref 2.5–4.6)

## 2022-06-05 LAB — TRIGLYCERIDES: Triglycerides: 128 mg/dL (ref <150)

## 2022-06-05 LAB — GLUCOSE, CAPILLARY
Glucose-Capillary: 163 mg/dL — ABNORMAL HIGH (ref 70–99)
Glucose-Capillary: 187 mg/dL — ABNORMAL HIGH (ref 70–99)
Glucose-Capillary: 194 mg/dL — ABNORMAL HIGH (ref 70–99)
Glucose-Capillary: 85 mg/dL (ref 70–99)
Glucose-Capillary: 132 mg/dL — ABNORMAL HIGH (ref 70–99)

## 2022-06-05 LAB — MAGNESIUM: Magnesium: 1.9 mg/dL (ref 1.7–2.4)

## 2022-06-05 MED ORDER — ZINC CHLORIDE 1 MG/ML IV SOLN
INTRAVENOUS | Status: AC
  Filled 2022-06-05: qty 681.33

## 2022-06-05 NOTE — Progress Notes (Signed)
PHARMACY - TOTAL PARENTERAL NUTRITION CONSULT NOTE   Indication: Prolonged ileus  Patient Measurements: Height: '4\' 11"'$  (149.9 cm) Weight: 98.1 kg (216 lb 4.3 oz) IBW/kg (Calculated) : 43.2 TPN AdjBW (KG): 55.2 Body mass index is 43.68 kg/m.  Assessment: Debra [name] is a 56 y.o. female s/p laparotomy, excision of greater omental mass, abdominal wall reconstruction with [name], appendectomy, and placement of Prevena vac.  Glucose / Insulin: No apparent history of diabetes. BG 110-194, 7 units of SSI moderate over last 24 hrs + 12 units insulin in TPN bag Electrolytes:  Patient tends to have Mag run <2.0 and already have max amount Mag in TPN -replace outside of TPN when needed Renal: Scr 0.49 (baseline 0.3-0.6) Hepatic: No transaminitis. LFTs within normal limits. TG within normal limits  GI Imaging: 9/11 CTAP: no new acute issues GI Surgeries / Procedures: s/p laparotomy, excision of greater omental mass, abdominal wall reconstruction with [name], appendectomy, and placement of Prevena vac  Central access: 02/15/22 TPN start date: 02/15/22  Nutritional Goals: Goal cyclic TPN over 14 hrs: Start Day 1 with cyclic TPN over 18 hrs: (provides 91 g of protein and 1622 kcals per day) total volume over 14hrs for calculations=1400 ml(1500 ml total with overfill)  RD Assessment:  Estimated Needs Total Energy Estimated Needs: 1800-2100kcal/day Total Protein Estimated Needs: 90-110g/d Total Fluid Estimated Needs: 1.4-1.6L/day  Current Nutrition:  Soft diet + nutritional supplements, not meeting PO needs yet   Plan: Transitioned to *Cyclic* TPN on 8/52. Run over 18 hrs for 1st day, tolerated well, now changed to run over 14 hours  per MD.  Day 4 (9/28):  Start rate at 54 mL/hr for 1 hours.   Increase rate to 108 mL/hr for 16 hours.   Decrease rate to 54 mL/hr for 1 hours, then stop.    Nutritional Components Amino acids (using 15% Clinisol):  102grams Dextrose 19% = 266 g Lipids (using 20% SMOFlipids): 50.4 grams kCal: 1817/24h  Electrolytes in TPN (standard): Na 67mq/L, K 511m/L, Ca 36m19mL, Mg 10 mEq/L , and Phos 10 mmol/L. Cl:Ac 1:1 Pt's mag will drop <2.0. watch. Need to replace outside TPN as [facility]. Added thiamine '100mg'$  back to TPN as B1 level was subtherapeutic. Holding trace elements and chromium per secure chat with RD. Continue Zinc '15mg'$  New CBG/SSI for Cyclic TPN:SSI  -CBG 2 hrs after cyclic TPN start -CBG during middle of cyclic TPN infusion -CBG 1 hr after cylic TPN stopped -CBG while off TPN (=4 CBGs per 24 hr period) + 12u insulin in TPN, continue to monitor  Check TPN labs on Mon/Thurs or as needed daily   Thank you for allowing pharmacy to be a part of this patient's care.   JusAlison MurrayharmD Clinical Pharmacist 06/05/2022 11:13 AM

## 2022-06-05 NOTE — Progress Notes (Signed)
Mobility Specialist - Progress Note   06/05/22 1453  Mobility  Activity Ambulated independently in hallway  Level of Assistance Independent  Assistive Device None  Distance Ambulated (ft) 320 ft  Activity Response Tolerated well  $Mobility charge 1 Mobility   Candie Mile Mobility Specialist 06/05/22 2:54 PM

## 2022-06-05 NOTE — Progress Notes (Signed)
[place] [facility] PROGRESS NOTE (cpt [id number])  Hospital Day(s): 112.   Post op day(s): 110 Days Post-Op.   Interval History:  Patient seen and examined No issues with current [name] system; RN staff doing a fantastic job managing effluent with Yankauer suction  [name] Pouch output 120 ccs + unmeasured in last 24 hours Nutritional labs are reassuring She is on soft; on TPN, now nightly Ambulating independently   Review of Systems:  Constitutional: denies fever, chills  HEENT: denies cough or congestion  Respiratory: denies any shortness of breath  Cardiovascular: denies chest pain or palpitations  Gastrointestinal: denies abdominal pain, N/V Genitourinary: denies burning with urination or urinary frequency Integumentary: + midline wound  Vital signs in last 24 hours: [min-max] current  Temp:  [97.6 F (36.4 C)-98.5 F (36.9 C)] 98.5 F (36.9 C) (09/28 0800) Pulse Rate:  [72-84] 79 (09/28 0800) Resp:  [14-20] 18 (09/28 0800) BP: (101-112)/(54-64) 101/54 (09/28 0800) SpO2:  [95 %-100 %] 95 % (09/28 0800)     Height: '4\' 11"'$  (149.9 cm) Weight: 98.1 kg BMI (Calculated): 43.04   Intake/Output last 2 shifts:  09/27 0701 - 09/28 0700 In: 120 [P.O.:120] Out: 120 [Drains:120]   Physical Exam:  Constitutional: alert, cooperative and no distress  Respiratory: breathing non-labored at rest  Cardiovascular: regular rate and sinus rhythm  Gastrointestinal: Soft, abdominal soreness on the right, non-distended, no rebound/guarding. Integumentary: Midline wound open, peritoneum closed; granulating;  there are three stomatized areas visible in the LUQ and LLQ portions of the wound, output remains feculent. There is hypergranulation tissue to the inferior wound edge. No further bleeding    Labs:     Latest Ref Rng & Units 05/29/2022    7:17 PM 05/29/2022    4:52 AM 05/26/2022    5:09 AM  CBC  WBC 4.0 - 10.5 K/uL  3.1  3.2   Hemoglobin 12.0 - 15.0 g/dL 9.3  6.8  8.3    Hematocrit 36.0 - 46.0 % 30.3  22.2  27.3   Platelets 150 - 400 K/uL  70  90       Latest Ref Rng & Units 06/05/2022    4:58 AM 06/04/2022    7:18 AM 06/03/2022   11:13 AM  CMP  Glucose 70 - 99 mg/dL 162  189  140   BUN 6 - 20 mg/dL '20  19  19   '$ Creatinine 0.44 - 1.00 mg/dL 0.49  0.47  0.45   Sodium 135 - 145 mmol/L 134  135  135   Potassium 3.5 - 5.1 mmol/L 3.9  4.3  4.2   Chloride 98 - 111 mmol/L 106  107  105   CO2 22 - 32 mmol/L '24  24  26   '$ Calcium 8.9 - 10.3 mg/dL 8.5  8.2  8.3   Total Protein 6.5 - 8.1 g/dL 6.9   6.5   Total Bilirubin 0.3 - 1.2 mg/dL 0.5   0.6   Alkaline Phos 38 - 126 U/L 117   117   AST 15 - 41 U/L 25   26   ALT 0 - 44 U/L 17   17     Imaging studies: No new pertinent imaging studies   Assessment/Plan:  56 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   -  New 09/28: No issues   - Wound Care: [name] pouch changed overnight (09/25) secondary to failure. No windowless pouches available at that time. Now with "window" in place but WITHOUT red rubber. She will need intermittent suction with Yankauer. Likely be beneficial to suction before ambulation. Changed 09/25; would extend life of current pouch system as long as feasible. Windowless pouches at bedside again, use for next change....she will need red rubber suction with "windowless" pouches.    - On Soft diet + nutritional supplementation - Continue TPN; transition to QHS - Appreciate dietary assistance with trace elements; iron supplementation            - Monitor abdominal examination; on-going bowel function            - Pain control prn; antemetic prn            - Out of bed; discharged from therapies; no recommendations at this time   -  Discharge Planning: Anticipate lengthy admission and potential eventual transfer.    All of the above findings and recommendations were discussed with the patient, and the medical team, and all of patient's questions were answered to her expressed satisfaction.  -- [name], PA-C [facility] 06/05/2022, 8:08 AM M-F: 7am - 4pm

## 2022-06-05 NOTE — TOC Progression Note (Signed)
Transition of Care [facility]) - Progression Note    Patient Details  Name: Debra [name] MRN: [id number] Date of Birth: 1966/05/11  Transition of Care [facility]) CM/SW [place], LCSW Phone Number: 06/05/2022, 12:44 PM  Clinical Narrative:  Discussed case with [facility] Physician Advisor. Will ask Dr. Dahlia Byes about reaching out to other tertiary care centers once appropriate. Updated TOC director who confirmed patient is too high risk to send anywhere other than another hospital at this time.  Expected Discharge Plan: [place] Barriers to Discharge: Continued Medical Work up  Expected Discharge Plan and Services Expected Discharge Plan: [place]                                               Social Determinants of Health (SDOH) Interventions    Readmission Risk Interventions     No data to display

## 2022-06-05 NOTE — TOC Progression Note (Signed)
Transition of Care [facility]) - Progression Note    Patient Details  Name: Debra [name] MRN: [id number] Date of Birth: [birth year]  Transition of Care [facility]) CM/SW Contact  Beverly Sessions, RN Phone Number: 06/05/2022, 8:46 AM  Clinical Narrative:     Per TOC director LOG for TPN not available due to there not being an end date   Expected Discharge Plan: [place] Barriers to Discharge: Continued Medical Work up  Expected Discharge Plan and Services Expected Discharge Plan: [place]                                               Social Determinants of Health (SDOH) Interventions    Readmission Risk Interventions     No data to display

## 2022-06-06 LAB — GLUCOSE, CAPILLARY
Glucose-Capillary: 141 mg/dL — ABNORMAL HIGH (ref 70–99)
Glucose-Capillary: 149 mg/dL — ABNORMAL HIGH (ref 70–99)
Glucose-Capillary: 145 mg/dL — ABNORMAL HIGH (ref 70–99)
Glucose-Capillary: 108 mg/dL — ABNORMAL HIGH (ref 70–99)
Glucose-Capillary: 135 mg/dL — ABNORMAL HIGH (ref 70–99)

## 2022-06-06 MED ORDER — ZINC CHLORIDE 1 MG/ML IV SOLN
INTRAVENOUS | Status: AC
  Filled 2022-06-06: qty 681.33

## 2022-06-06 NOTE — Progress Notes (Signed)
[place] [facility] PROGRESS NOTE (cpt [phone])  Hospital Day(s): 113.   Post op day(s): 111 Days Post-Op.   Interval History:  Patient seen and examined No issues with current [name] system; RN staff doing a fantastic job managing effluent with Yankauer suction  [name] Pouch output 450 ccs + unmeasured in last 24 hours She is on soft diet; on TPN, now nightly Ambulating independently   Review of Systems:  Constitutional: denies fever, chills  HEENT: denies cough or congestion  Respiratory: denies any shortness of breath  Cardiovascular: denies chest pain or palpitations  Gastrointestinal: denies abdominal pain, N/V Genitourinary: denies burning with urination or urinary frequency Integumentary: + midline wound  Vital signs in last 24 hours: [min-max] current  Temp:  [97.7 F (36.5 C)-98.5 F (36.9 C)] 97.7 F (36.5 C) (09/29 0225) Pulse Rate:  [75-79] 75 (09/29 0225) Resp:  [18-20] 20 (09/29 0225) BP: (99-104)/(50-54) 99/50 (09/29 0225) SpO2:  [95 %-99 %] 99 % (09/29 0225) Weight:  [98.8 kg] 98.8 kg (09/29 0500)     Height: '4\' 11"'$  (149.9 cm) Weight: 98.8 kg BMI (Calculated): 43.04   Intake/Output last 2 shifts:  09/28 0701 - 09/29 0700 In: 360 [P.O.:360] Out: 453 [Urine:3; Drains:450]   Physical Exam:  Constitutional: alert, cooperative and no distress  Respiratory: breathing non-labored at rest  Cardiovascular: regular rate and sinus rhythm  Gastrointestinal: Soft, abdominal soreness on the right, non-distended, no rebound/guarding. Integumentary: Midline wound open, peritoneum closed; granulating;  there are three stomatized areas visible in the LUQ and LLQ portions of the wound, output remains feculent. There is hypergranulation tissue to the inferior wound edge. No further bleeding    Labs:     Latest Ref Rng & Units 05/29/2022    7:17 PM 05/29/2022    4:52 AM 05/26/2022    5:09 AM  CBC  WBC 4.0 - 10.5 K/uL  3.1  3.2   Hemoglobin 12.0 - 15.0 g/dL  9.3  6.8  8.3   Hematocrit 36.0 - 46.0 % 30.3  22.2  27.3   Platelets 150 - 400 K/uL  70  90       Latest Ref Rng & Units 06/05/2022    4:58 AM 06/04/2022    7:18 AM 06/03/2022   11:13 AM  CMP  Glucose 70 - 99 mg/dL 162  189  140   BUN 6 - 20 mg/dL '20  19  19   '$ Creatinine 0.44 - 1.00 mg/dL 0.49  0.47  0.45   Sodium 135 - 145 mmol/L 134  135  135   Potassium 3.5 - 5.1 mmol/L 3.9  4.3  4.2   Chloride 98 - 111 mmol/L 106  107  105   CO2 22 - 32 mmol/L '24  24  26   '$ Calcium 8.9 - 10.3 mg/dL 8.5  8.2  8.3   Total Protein 6.5 - 8.1 g/dL 6.9   6.5   Total Bilirubin 0.3 - 1.2 mg/dL 0.5   0.6   Alkaline Phos 38 - 126 U/L 117   117   AST 15 - 41 U/L 25   26   ALT 0 - 44 U/L 17   17     Imaging studies: No new pertinent imaging studies   Assessment/Plan:  56 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   - New 09/29: No issues   - Wound Care: [name] pouch changed overnight (09/25) secondary to failure. No windowless pouches available at that time. Now with "window" in place but WITHOUT red rubber. She will need intermittent suction with Yankauer. Likely be beneficial to suction before ambulation. Changed 09/25; would extend life of current pouch system as long as feasible. Windowless pouches at bedside again, use for next change....she will need red rubber suction with "windowless" pouches.    - On Soft diet + nutritional supplementation - Continue TPN; transition to QHS - Appreciate dietary assistance with trace elements; iron supplementation            - Monitor abdominal examination; on-going bowel function            - Pain control prn; antemetic prn            - Out of bed; discharged from therapies; no recommendations at  this time   - Discharge Planning: Anticipate lengthy admission and potential eventual transfer.    All of the above findings and recommendations were discussed with the patient, and the medical team, and all of patient's questions were answered to her expressed satisfaction.  -- [name], PA-C [facility] 06/06/2022, 7:30 AM M-F: 7am - 4pm

## 2022-06-06 NOTE — Progress Notes (Signed)
PHARMACY - TOTAL PARENTERAL NUTRITION CONSULT NOTE   Indication: Prolonged ileus  Patient Measurements: Height: '4\' 11"'$  (149.9 cm) Weight: 98.8 kg (217 lb 13 oz) IBW/kg (Calculated) : 43.2 TPN AdjBW (KG): 55.2 Body mass index is 43.99 kg/m.  Assessment: Debra [name] is a 56 y.o. female s/p laparotomy, excision of greater omental mass, abdominal wall reconstruction with [name], appendectomy, and placement of Prevena vac.  Glucose / Insulin: No apparent history of diabetes. BG 85-194, 7 units of SSI moderate over last 24 hrs + 12 units insulin in TPN bag Electrolytes:  Patient tends to have Mag run <2.0 and already have max amount Mag in TPN -replace outside of TPN when needed Renal: Scr 0.49 (baseline 0.3-0.6) Hepatic: No transaminitis. LFTs within normal limits. TG within normal limits  GI Imaging: 9/11 CTAP: no new acute issues GI Surgeries / Procedures: s/p laparotomy, excision of greater omental mass, abdominal wall reconstruction with [name], appendectomy, and placement of Prevena vac  Central access: 02/15/22 TPN start date: 02/15/22  Nutritional Goals: Goal cyclic TPN over 14 hrs: Start Day 1 with cyclic TPN over 18 hrs: (provides 91 g of protein and 1622 kcals per day) total volume over 14hrs for calculations=1400 ml(1500 ml total with overfill)  RD Assessment:  Estimated Needs Total Energy Estimated Needs: 1800-2100kcal/day Total Protein Estimated Needs: 90-110g/d Total Fluid Estimated Needs: 1.4-1.6L/day  Current Nutrition:  Soft diet + nutritional supplements, not meeting PO needs yet   Plan: Transitioned to *Cyclic* TPN on 4/12. Run over 18 hrs for 1st day, tolerated well, now changed to run over 14 hours  per MD.  Day 5 (9/29):  Start rate at 57 mL/hr for 1 hour.   Increase rate to 114 mL/hr for 11 hours.   Decrease rate to 57 mL/hr for 1 hour.  Decrease rate to 29 mL/hr for 1 hour, then stop.    Nutritional  Components Amino acids (using 15% Clinisol): 102grams Dextrose 19% = 266 g Lipids (using 20% SMOFlipids): 50.4 grams kCal: 1817/24h  Electrolytes in TPN (standard): Na 62mq/L, K 5548m/L, Ca 48m4mL, Mg 10 mEq/L , and Phos 10 mmol/L. Cl:Ac 1:1 Pt's mag will drop <2.0. watch. Need to [facility] outside [facility]. Added thiamine '100mg'$  back to TPN as B1 level was subtherapeutic. Holding trace elements and chromium per secure chat with RD. Continue Zinc '15mg'$  New CBG/SSI for Cyclic TPN:SSI  -CBG 2 hrs after cyclic TPN start -CBG during middle of cyclic TPN infusion -CBG 1 hr after cylic TPN stopped -CBG while off TPN (=4 CBGs per 24 hr period) + 12u insulin in TPN, continue to monitor  Check TPN labs on Mon/Thurs or as needed daily   Thank you for allowing pharmacy to be a part of this patient's care.   BraLorna DibbleharmD Clinical Pharmacist 06/06/2022 10:10 AM

## 2022-06-07 LAB — GLUCOSE, CAPILLARY
Glucose-Capillary: 181 mg/dL — ABNORMAL HIGH (ref 70–99)
Glucose-Capillary: 113 mg/dL — ABNORMAL HIGH (ref 70–99)
Glucose-Capillary: 134 mg/dL — ABNORMAL HIGH (ref 70–99)
Glucose-Capillary: 90 mg/dL (ref 70–99)

## 2022-06-07 MED ORDER — ZINC CHLORIDE 1 MG/ML IV SOLN
INTRAVENOUS | Status: AC
  Filled 2022-06-07: qty 681.33

## 2022-06-07 NOTE — Progress Notes (Signed)
PHARMACY - TOTAL PARENTERAL NUTRITION CONSULT NOTE   Indication: Prolonged ileus  Patient Measurements: Height: '4\' 11"'$  (149.9 cm) Weight: 99.2 kg (218 lb 11.1 oz) IBW/kg (Calculated) : 43.2 TPN AdjBW (KG): 55.2 Body mass index is 44.17 kg/m.  Assessment: Debra [name] is a 56 y.o. female s/p laparotomy, excision of greater omental mass, abdominal wall reconstruction with [name], appendectomy, and placement of Prevena vac.  Glucose / Insulin: No apparent history of diabetes. BG 108-181, 8 units of SSI moderate over last 24 hrs + 12 units insulin in TPN bag Electrolytes:  Patient tends to have Mag run <2.0 and already have max amount Mag in TPN -replace outside of TPN when needed Renal: Scr 0.49 (baseline 0.3-0.6) Hepatic: No transaminitis. LFTs within normal limits. TG within normal limits  GI Imaging: 9/11 CTAP: no new acute issues GI Surgeries / Procedures: s/p laparotomy, excision of greater omental mass, abdominal wall reconstruction with [name], appendectomy, and placement of Prevena vac  Central access: 02/15/22 TPN start date: 02/15/22  Nutritional Goals: Goal cyclic TPN over 14 hrs: Start Day 1 with cyclic TPN over 18 hrs: (provides 91 g of protein and 1622 kcals per day) total volume over 14hrs for calculations=1400 ml(1500 ml total with overfill)  RD Assessment:  Estimated Needs Total Energy Estimated Needs: 1800-2100kcal/day Total Protein Estimated Needs: 90-110g/d Total Fluid Estimated Needs: 1.4-1.6L/day  Current Nutrition:  Soft diet + nutritional supplements, not meeting PO needs yet   Plan: Transitioned to *Cyclic* TPN on 6/60. Run over 18 hrs for 1st day, tolerated well, now changed to run over 14 hours  per MD.  Day 5 (9/29):  Start rate at 57 mL/hr for 1 hour.   Increase rate to 114 mL/hr for 11 hours.   Decrease rate to 57 mL/hr for 1 hour.  Decrease rate to 29 mL/hr for 1 hour, then stop.    Nutritional  Components Amino acids (using 15% Clinisol): 102grams Dextrose 19% = 266 g Lipids (using 20% SMOFlipids): 50.4 grams kCal: 1817/24h  Electrolytes in TPN (standard): Na 30mq/L, K 539m/L, Ca 27m44mL, Mg 10 mEq/L , and Phos 10 mmol/L. Cl:Ac 1:1 Pt's mag will drop <2.0. watch. Need [facility]. Added thiamine '100mg'$  back to TPN as B1 level was subtherapeutic. Holding trace elements and chromium per secure chat with RD. Continue Zinc '15mg'$  New CBG/SSI for Cyclic TPN:SSI  -CBG 2 hrs after cyclic TPN start -CBG during middle of cyclic TPN infusion -CBG 1 hr after cylic TPN stopped -CBG while off TPN (=4 CBGs per 24 hr period) + 12u insulin in TPN, continue to monitor  Check TPN labs on Mon/Thurs or as needed daily   Thank you for allowing pharmacy to be a part of this patient's care.   [name] Clinical Pharmacist 06/07/2022 10:22 AM

## 2022-06-07 NOTE — Progress Notes (Signed)
Mobility Specialist - Progress Note   06/07/22 1443  Mobility  Activity Ambulated independently in hallway  Level of Assistance Independent  Assistive Device None  Distance Ambulated (ft) 640 ft  Activity Response Tolerated well  $Mobility charge 1 Mobility   Pt OOB in hallway on RA upon arrival. Pt ambulates 4 laps around NS indep with a slow gait. Pt returns to bed with needs in reach.   Gretchen Short  Mobility Specialist  06/07/22 2:44 PM

## 2022-06-07 NOTE — Progress Notes (Signed)
Patient ID: Debra [name], female   DOB: 1966-08-17, 56 y.o.   MRN: [id number]     [place] [facility] Day(s): 114.   Interval History: Patient seen and examined, no acute events or new complaints overnight. Patient reports feeling well.  She denies any new complaint.  She has been tolerating diet.  She denies any issues with the rectum bag.  Vital signs in last 24 hours: [min-max] current  Temp:  [98.1 F (36.7 C)-98.3 F (36.8 C)] 98.3 F (36.8 C) (09/30 0808) Pulse Rate:  [76-83] 81 (09/30 0808) Resp:  [17-18] 18 (09/30 0808) BP: (94-148)/(51-63) 94/51 (09/30 0808) SpO2:  [98 %-100 %] 98 % (09/30 0808) Weight:  [99.2 kg] 99.2 kg (09/30 0319)     Height: '4\' 11"'$  (149.9 cm) Weight: 99.2 kg BMI (Calculated): 43.04   Physical Exam:  Constitutional: alert, cooperative and no distress  Respiratory: breathing non-labored at rest  Cardiovascular: regular rate and sinus rhythm  Gastrointestinal: soft, non-tender, and non-distended large midline open wound with fistula.  This is covered by pouch.  Labs:     Latest Ref Rng & Units 05/29/2022    7:17 PM 05/29/2022    4:52 AM 05/26/2022    5:09 AM  CBC  WBC 4.0 - 10.5 K/uL  3.1  3.2   Hemoglobin 12.0 - 15.0 g/dL 9.3  6.8  8.3   Hematocrit 36.0 - 46.0 % 30.3  22.2  27.3   Platelets 150 - 400 K/uL  70  90       Latest Ref Rng & Units 06/05/2022    4:58 AM 06/04/2022    7:18 AM 06/03/2022   11:13 AM  CMP  Glucose 70 - 99 mg/dL 162  189  140   BUN 6 - 20 mg/dL '20  19  19   '$ Creatinine 0.44 - 1.00 mg/dL 0.49  0.47  0.45   Sodium 135 - 145 mmol/L 134  135  135   Potassium 3.5 - 5.1 mmol/L 3.9  4.3  4.2   Chloride 98 - 111 mmol/L 106  107  105   CO2 22 - 32 mmol/L '24  24  26   '$ Calcium 8.9 - 10.3 mg/dL 8.5  8.2  8.3   Total Protein 6.5 - 8.1 g/dL 6.9   6.5   Total Bilirubin 0.3 - 1.2 mg/dL 0.5   0.6   Alkaline Phos 38 - 126 U/L 117   117   AST 15 - 41 U/L 25   26   ALT 0 - 44 U/L 17   17     Imaging studies: No new  pertinent imaging studies   Assessment/Plan:  56 y.o. female with metastatic colon cancer s/p omentectomy and incisional hernia repair 114 Days Post-Op, complicated by pertinent comorbidities including enterocutaneous fistula.   -There has been no clinical deterioration. Adequate vital signs, no fever. -Having stable fistula output -Patient tolerating soft diet.  This is being supplemented with TPN at night. -I encouraged the patient to ambulate.   -We will continue with DVT prophylaxis  [name], MD

## 2022-06-08 LAB — GLUCOSE, CAPILLARY
Glucose-Capillary: 152 mg/dL — ABNORMAL HIGH (ref 70–99)
Glucose-Capillary: 165 mg/dL — ABNORMAL HIGH (ref 70–99)
Glucose-Capillary: 149 mg/dL — ABNORMAL HIGH (ref 70–99)
Glucose-Capillary: 96 mg/dL (ref 70–99)
Glucose-Capillary: 133 mg/dL — ABNORMAL HIGH (ref 70–99)

## 2022-06-08 MED ORDER — ZINC CHLORIDE 1 MG/ML IV SOLN
INTRAVENOUS | Status: AC
  Filled 2022-06-08: qty 681.33

## 2022-06-08 NOTE — Progress Notes (Signed)
Patient ID: Debra [name], female   DOB: [birth year], 56 y.o.   MRN: [id number]     [place] Hospital Day(s): 115.   Interval History: Patient seen and examined, no acute events or new complaints overnight. Patient reports no new complaints.  She denies any new pain.  She endorses she has been tolerating diet.  She is eating small amount.  She denies any nausea or vomiting.  Denies any issues with the pouch.  Vital signs in last 24 hours: [min-max] current  Temp:  [97.6 F (36.4 C)-98.8 F (37.1 C)] 97.6 F (36.4 C) (10/01 0825) Pulse Rate:  [73-78] 77 (10/01 0825) Resp:  [17-20] 18 (10/01 0825) BP: (99-113)/(50-63) 99/50 (10/01 0825) SpO2:  [93 %-100 %] 93 % (10/01 0825) Weight:  [99.4 kg] 99.4 kg (10/01 0338)     Height: '4\' 11"'$  (149.9 cm) Weight: 99.4 kg BMI (Calculated): 43.04   Physical Exam:  Constitutional: alert, cooperative and no distress  Respiratory: breathing non-labored at rest  Cardiovascular: regular rate and sinus rhythm  Gastrointestinal: soft, non-tender, and non-distended.  Pouch in place  Labs:     Latest Ref Rng & Units 05/29/2022    7:17 PM 05/29/2022    4:52 AM 05/26/2022    5:09 AM  CBC  WBC 4.0 - 10.5 K/uL  3.1  3.2   Hemoglobin 12.0 - 15.0 g/dL 9.3  6.8  8.3   Hematocrit 36.0 - 46.0 % 30.3  22.2  27.3   Platelets 150 - 400 K/uL  70  90       Latest Ref Rng & Units 06/05/2022    4:58 AM 06/04/2022    7:18 AM 06/03/2022   11:13 AM  CMP  Glucose 70 - 99 mg/dL 162  189  140   BUN 6 - 20 mg/dL '20  19  19   '$ Creatinine 0.44 - 1.00 mg/dL 0.49  0.47  0.45   Sodium 135 - 145 mmol/L 134  135  135   Potassium 3.5 - 5.1 mmol/L 3.9  4.3  4.2   Chloride 98 - 111 mmol/L 106  107  105   CO2 22 - 32 mmol/L '24  24  26   '$ Calcium 8.9 - 10.3 mg/dL 8.5  8.2  8.3   Total Protein 6.5 - 8.1 g/dL 6.9   6.5   Total Bilirubin 0.3 - 1.2 mg/dL 0.5   0.6   Alkaline Phos 38 - 126 U/L 117   117   AST 15 - 41 U/L 25   26   ALT 0 - 44 U/L 17   17      Imaging studies: No new pertinent imaging studies   Assessment/Plan:  56 y.o. female with metastatic colon cancer s/p omentectomy and incisional hernia repair 115 Days Post-Op, complicated by pertinent comorbidities including enterocutaneous fistula.  -Patient with stable vital signs.  No fever -Abdominal physical exam without any clinical changes -Continue with increased fistula output.  Last 24 hours with 800 cc. -No issues with pouch -Continue with current diet and supplement with TPN at night -Continue DVT prophylaxis -Continue ambulation  [name], MD

## 2022-06-08 NOTE — Plan of Care (Signed)
  Problem: Clinical Measurements: Goal: Will remain free from infection Outcome: Progressing   Problem: Nutrition: Goal: Adequate nutrition will be maintained Outcome: Progressing   Problem: Activity: Goal: Risk for activity intolerance will decrease Outcome: Progressing   Problem: Nutrition: Goal: Adequate nutrition will be maintained Outcome: Progressing   Problem: Coping: Goal: Level of anxiety will decrease Outcome: Progressing   Problem: Pain Managment: Goal: General experience of comfort will improve Outcome: Progressing   

## 2022-06-09 LAB — PHOSPHORUS: Phosphorus: 3.5 mg/dL (ref 2.5–4.6)

## 2022-06-09 LAB — COMPREHENSIVE METABOLIC PANEL
ALT: 16 U/L (ref 0–44)
AST: 27 U/L (ref 15–41)
Albumin: 2.7 g/dL — ABNORMAL LOW (ref 3.5–5.0)
Alkaline Phosphatase: 103 U/L (ref 38–126)
Anion gap: 7 (ref 5–15)
BUN: 18 mg/dL (ref 6–20)
CO2: 23 mmol/L (ref 22–32)
Calcium: 8 mg/dL — ABNORMAL LOW (ref 8.9–10.3)
Chloride: 104 mmol/L (ref 98–111)
Creatinine, Ser: 0.41 mg/dL — ABNORMAL LOW (ref 0.44–1.00)
GFR, Estimated: >60 mL/min (ref >60)
Glucose, Bld: 156 mg/dL — ABNORMAL HIGH (ref 70–99)
Potassium: 4.2 mmol/L (ref 3.5–5.1)
Sodium: 134 mmol/L — ABNORMAL LOW (ref 135–145)
Total Bilirubin: 0.5 mg/dL (ref 0.3–1.2)
Total Protein: 6.2 g/dL — ABNORMAL LOW (ref 6.5–8.1)

## 2022-06-09 LAB — GLUCOSE, CAPILLARY
Glucose-Capillary: 121 mg/dL — ABNORMAL HIGH (ref 70–99)
Glucose-Capillary: 136 mg/dL — ABNORMAL HIGH (ref 70–99)
Glucose-Capillary: 105 mg/dL — ABNORMAL HIGH (ref 70–99)
Glucose-Capillary: 79 mg/dL (ref 70–99)
Glucose-Capillary: 127 mg/dL — ABNORMAL HIGH (ref 70–99)

## 2022-06-09 LAB — TRIGLYCERIDES: Triglycerides: 99 mg/dL (ref <150)

## 2022-06-09 LAB — MAGNESIUM: Magnesium: 1.8 mg/dL (ref 1.7–2.4)

## 2022-06-09 MED ORDER — ZINC CHLORIDE 1 MG/ML IV SOLN
INTRAVENOUS | Status: AC
  Filled 2022-06-09: qty 681.33

## 2022-06-09 NOTE — Progress Notes (Signed)
Consult received for PORT needle change. Secure chat sent to nurse. Nurse, Quincy Simmonds RN verified having competency. Unit nurse to perform the PORT needle change. Fran Lowes, RN VAST

## 2022-06-09 NOTE — Progress Notes (Signed)
Nutrition Follow-up  DOCUMENTATION CODES:   Obesity unspecified  INTERVENTION:   Continue cyclic TPN per pharmacy- provides 1817kcal/day and 102g/day protein   Continue Zinc 50m daily added to TPN   Ensure Enlive po TID, each supplement provides 350 kcal and 20 grams of protein.  MVI po daily   Daily weights   Vitamin D3- 2000 units po daily   Vitamin A 10,000 units po daily   Thiamine 1038mdaily in TPN   Check zinc, copper, manganese, chromium and selenium labs.   NUTRITION DIAGNOSIS:   Increased nutrient needs related to wound healing, catabolic illness as evidenced by estimated needs.  GOAL:   Patient will meet greater than or equal to 90% of their needs -met with TPN   MONITOR:   PO intake, Supplement acceptance, Labs, Weight trends, Diet advancement, I & O's, TPN  ASSESSMENT:   [age]/o female with h/o hypothyroidism, COVID 19 (3/21), kidney stones and stage 3 colon cancer (s/p left hemicolectomy 5/21 and chemotherapy) who is admitted for new pelvic mass now s/p laparotomy 6/8 (with excision of pelvic mass from greater omentum, abdominal wall reconstruction with bilateral myocutaneous flaps and mesh, incisional hernia repair, appendectomy repair and VAC placement) complicated by bowel perforation s/p reopening of recent laparotomy 6/10 (with repair of small bowel perforation, excision of mesh, placement of two phasix mesh and VAC placement). Pathology returned as metastatic adenocarcinoma.   Pt tolerating cyclic TPN at goal rate. Refeed labs stable. Triglycerides wnl. Hyperglycemia resolved. Insulin remains in TPN. Trace elements on hold; will recheck labs today. Pt is receiving MVI po daily. Vitamins returned low and are being supplemented; will recheck labs in two weeks. Pt remains on a soft diet; pt eating <25% of meals. Pt is drinking some Ensure. Per chart, pt appears weight stable since admission. [name] pouch remains in place with [id number] documented.     Medications reviewed and include: D3, lovenox, insulin, L-glutamine, synthroid, imodium, MVI, protonix, carafate, vitamin A, zinc (in TPN), thiamine (in TPN)  -Selenium 220 wnl, Chromium 2.8(H), Manganese 27.7(H), copper 124 wnl, zinc 38.0(L)- 9/7 -Vitamin B1- 59.6(L), Vitamin D 24.10(L), vitamin A 18.5(L), B12 256 wnl, B6 13.3 wnl, vitamin E 11.7 wnl, vitamin K 0.32 wnl- 9/14  Labs reviewed: Na 134(L), K 4.2 wnl, creat 0.41(L), P 3.5 wnl, Mg 1.8 wnl Triglycerides- 99 Ferritin- 11.0 wnl, folate 13.2 wnl- 9/14 Hgb 9.3(L), Hct 30.3(L)- 9/21 Cbgs-  105, 136, 121 x 24 hrs  Diet Order:    Diet Order             DIET SOFT Room service appropriate? Yes; Fluid consistency: Thin  Diet effective now                  EDUCATION NEEDS:   Not appropriate for education at this time  Skin:  Skin Assessment: Reviewed RN Assessment ( Midline wound: 15cm x 10cm x 6cm )  Last BM:  9/30  Height:   Ht Readings from Last 1 Encounters:  03/01/22 '4\' 11"'  (1.499 m)    Weight:   Wt Readings from Last 1 Encounters:  06/09/22 98.4 kg    Ideal Body Weight:  44.3 kg  BMI:  Body mass index is 43.82 kg/m.  Estimated Nutritional Needs:   Kcal:  1800-2100kcal/day  Protein:  90-110g/d  Fluid:  1.4-1.6L/day  CasKoleen Distance, RD, LDN Please refer to [facility] RD and/or RD on-call/weekend/after hours pager

## 2022-06-09 NOTE — Progress Notes (Signed)
[facility] SURGICAL PROGRESS NOTE (cpt [phone])  Hospital Day(s): 116.   Post op day(s): 114 Days Post-Op.   Interval History:  Patient seen and examined No issues with current [name] system; RN staff doing a fantastic job managing effluent with Yankauer suction  Nutritional labs reassuring; trace elements pending  [name] Pouch output 350 ccs + unmeasured in last 24 hours She is on soft diet; on TPN, now nightly Ambulating independently   Review of Systems:  Constitutional: denies fever, chills  HEENT: denies cough or congestion  Respiratory: denies any shortness of breath  Cardiovascular: denies chest pain or palpitations  Gastrointestinal: denies abdominal pain, N/V Genitourinary: denies burning with urination or urinary frequency Integumentary: + midline wound  Vital signs in last 24 hours: [min-max] current  Temp:  [97.6 F (36.4 C)-98.7 F (37.1 C)] 98.4 F (36.9 C) (10/02 0303) Pulse Rate:  [69-80] 80 (10/02 0303) Resp:  [16-18] 16 (10/02 0303) BP: (99-103)/(50-60) 103/56 (10/02 0303) SpO2:  [93 %-100 %] 100 % (10/02 0303) Weight:  [98.4 kg] 98.4 kg (10/02 0426)     Height: '4\' 11"'$  (149.9 cm) Weight: 98.4 kg BMI (Calculated): 43.04   Intake/Output last 2 shifts:  10/01 0701 - 10/02 0700 In: 374.3 [I.V.:374.3] Out: 350 [Drains:350]   Physical Exam:  Constitutional: alert, cooperative and no distress  Respiratory: breathing non-labored at rest  Cardiovascular: regular rate and sinus rhythm  Gastrointestinal: Soft, abdominal soreness on the right, non-distended, no rebound/guarding. Integumentary: Midline wound open, peritoneum closed; granulating;  there are three stomatized areas visible in the LUQ and LLQ portions of the wound, output remains feculent. There is hypergranulation tissue to the inferior wound edge. No further bleeding    Labs:     Latest Ref Rng & Units 05/29/2022    7:17 PM 05/29/2022    4:52 AM 05/26/2022    5:09 AM  CBC  WBC 4.0  - 10.5 K/uL  3.1  3.2   Hemoglobin 12.0 - 15.0 g/dL 9.3  6.8  8.3   Hematocrit 36.0 - 46.0 % 30.3  22.2  27.3   Platelets 150 - 400 K/uL  70  90       Latest Ref Rng & Units 06/09/2022    4:52 AM 06/05/2022    4:58 AM 06/04/2022    7:18 AM  CMP  Glucose 70 - 99 mg/dL 156  162  189   BUN 6 - 20 mg/dL '18  20  19   '$ Creatinine 0.44 - 1.00 mg/dL 0.41  0.49  0.47   Sodium 135 - 145 mmol/L 134  134  135   Potassium 3.5 - 5.1 mmol/L 4.2  3.9  4.3   Chloride 98 - 111 mmol/L 104  106  107   CO2 22 - 32 mmol/L '23  24  24   '$ Calcium 8.9 - 10.3 mg/dL 8.0  8.5  8.2   Total Protein 6.5 - 8.1 g/dL 6.2  6.9    Total Bilirubin 0.3 - 1.2 mg/dL 0.5  0.5    Alkaline Phos 38 - 126 U/L 103  117    AST 15 - 41 U/L 27  25    ALT 0 - 44 U/L 16  17      Imaging studies: No new pertinent imaging studies   Assessment/Plan:  56 y.o. female with high output enterocutaneous fistula 114 Days Post-Op s/p re-opening of laparotomy for repair of small bowel perforation following initial laparotomy, excision of greater omental mass, abdominal wall reconstruction  with [name], appendectomy, and placement of Prevena vac on 06/08.   - New 10/02: No issues   - Wound Care: [name] pouch changed overnight (09/25) secondary to failure. No windowless pouches available at that time. Now with "window" in place but WITHOUT red rubber. She will need intermittent suction with Yankauer. Likely be beneficial to suction before ambulation. Changed 09/25; would extend life of current pouch system as long as feasible. Windowless pouches at bedside again, use for next change....she will need red rubber suction with "windowless" pouches.    - On Soft diet + nutritional supplementation - Continue TPN; transition to QHS - Appreciate dietary assistance with trace elements; iron supplementation            - Monitor abdominal examination; on-going bowel function            - Pain control prn; antemetic prn            - Out of bed;  discharged from therapies; no recommendations at this time   - Discharge Planning: Anticipate lengthy admission and potential eventual transfer.    All of the above findings and recommendations were discussed with the patient, and the medical team, and all of patient's questions were answered to her expressed satisfaction.  -- [name], PA-C [facility] 06/09/2022, 7:26 AM M-F: 7am - 4pm

## 2022-06-09 NOTE — Consult Note (Addendum)
[place] Nurse wound follow up Called by bedside nurse; current [name] pouch began leaking at the bottom lower edges.  Applied new medium [name] pouch without window.  Wound is beefy red, stoma fistulas visible. 1 large pouch without window left in the room for staff nurse use. The medium pouch will not maintain a seal as long as the previous pouch since the bordered edge is smaller.  Orders provided for bedside nurses as follows:  Bedside nurse; please change PRN if pouch is leaking.  Trace pattern in room, cut out and place over the wound. Midline wound with [name] pouch, insert suction catheter in side port to intermittently suction out contents with are pooling. Pattern for [name] in the room. DO NOT THROW AWAY. Thank-you,  Julien Girt MSN, [place], [place], [place], [place]

## 2022-06-09 NOTE — Consult Note (Addendum)
[place] Nurse wound follow up Current [name] pouch is intact with good seal over abd full thickness wound with fistulas; mod amt brown liquid, no wall suction attached. Supplies at bedside and instructions have been provided if they need to change the pouch PRN if leaking. Thank-you,  Julien Girt MSN, [place], [place], [place], [place]

## 2022-06-09 NOTE — Progress Notes (Signed)
PHARMACY - TOTAL PARENTERAL NUTRITION CONSULT NOTE   Indication: Prolonged ileus  Patient Measurements: Height: '4\' 11"'$  (149.9 cm) Weight: 98.4 kg (216 lb 14.9 oz) IBW/kg (Calculated) : 43.2 TPN AdjBW (KG): 55.2 Body mass index is 43.82 kg/m.  Assessment: Debra [name] is a 56 y.o. female s/p laparotomy, excision of greater omental mass, abdominal wall reconstruction with [name], appendectomy, and placement of Prevena vac.  Glucose / Insulin: No apparent history of diabetes. Blood glucoses have been well-controlled on current insulin regimen (SSI 4x/day + 12u insulin in TPN) Electrolytes: Magnesium persistently at LLN Renal: Scr stable (0.3-0.6) Hepatic: No transaminitis. LFTs within normal limits. TG within normal limits  GI Imaging: 9/11 CTAP: no new acute issues GI Surgeries / Procedures: s/p laparotomy, excision of greater omental mass, abdominal wall reconstruction with [name], appendectomy, and placement of Prevena vac  Central access: 02/15/22 TPN start date: 02/15/22  Nutritional Goals: Goal cyclic TPN over 14 hrs: Start Day 1 with cyclic TPN over 18 hrs: (provides 91 g of protein and 1622 kcals per day) total volume over 14hrs for calculations=1400 ml(1500 ml total with overfill)  RD Assessment:  Estimated Needs Total Energy Estimated Needs: 1800-2100kcal/day Total Protein Estimated Needs: 90-110g/d Total Fluid Estimated Needs: 1.4-1.6L/day  Current Nutrition:  Soft diet + nutritional supplements, not meeting PO needs yet   Plan: Transitioned to *Cyclic* TPN on 8/27. Run over 18 hrs for 1st day, tolerated well, now changed to run over 14 hours  per MD.  Day 5 (9/29):  Start rate at 57 mL/hr for 1 hour.   Increase rate to 114 mL/hr for 11 hours.   Decrease rate to 57 mL/hr for 1 hour.  Decrease rate to 29 mL/hr for 1 hour, then stop.    Nutritional Components Amino acids (using 15% Clinisol): 102 g Dextrose 19% = 266  g Lipids (using 20% SMOFlipids): 50.4 g kCal: 1817/24h  Electrolytes in TPN (standard): Na 20mq/L, K 542m/L, Ca 24m41mL, Mg 10 mEq/L (max recommended), Phos 10 mmol/L. Cl:Ac 1:1 Enteral multivitamin. Added thiamine 100 mg back to TPN as B1 level deficient. Holding trace elements and chromium per discussion with RD. Continue Zinc 15 mg New CBG/SSI for Cyclic TPN: SSI  -CBG 2 hrs after cyclic TPN start -CBG during middle of cyclic TPN infusion -CBG 1 hr after cylic TPN stopped -CBG while off TPN (=4 CBGs per 24 hr period) + 12u insulin in TPN Check TPN labs on Mon/Thurs at minimum  Thank you for allowing pharmacy to be a part of this patient's care.  [name]/10/2021 9:51 AM

## 2022-06-10 LAB — GLUCOSE, CAPILLARY
Glucose-Capillary: 120 mg/dL — ABNORMAL HIGH (ref 70–99)
Glucose-Capillary: 133 mg/dL — ABNORMAL HIGH (ref 70–99)
Glucose-Capillary: 143 mg/dL — ABNORMAL HIGH (ref 70–99)
Glucose-Capillary: 152 mg/dL — ABNORMAL HIGH (ref 70–99)
Glucose-Capillary: 155 mg/dL — ABNORMAL HIGH (ref 70–99)
Glucose-Capillary: 177 mg/dL — ABNORMAL HIGH (ref 70–99)

## 2022-06-10 MED ORDER — PANTOPRAZOLE SODIUM 40 MG PO TBEC
40.0000 mg | DELAYED_RELEASE_TABLET | Freq: Two times a day (BID) | ORAL | Status: DC
  Administered 2022-06-10 – 2023-04-27 (×629): 40 mg via ORAL
  Filled 2022-06-10 (×130): qty 1
  Filled 2022-06-10: qty 2
  Filled 2022-06-10 (×507): qty 1

## 2022-06-10 MED ORDER — ZINC CHLORIDE 1 MG/ML IV SOLN
INTRAVENOUS | Status: DC
  Filled 2022-06-10 (×2): qty 681.33

## 2022-06-10 MED ORDER — ZINC CHLORIDE 1 MG/ML IV SOLN
INTRAVENOUS | Status: AC
  Filled 2022-06-10: qty 681.33

## 2022-06-10 NOTE — Consult Note (Addendum)
[place] Nurse wound follow up Received a Secure Chat message this morning, which was sent late yesterday afternoon by the bedside nurse after I left work, that [employer] was leaking. She changed it to a large windowless pouch. Current pouch is intact with good seal, mod amt brown liquid in the bag. 2 large extra pouches in the room for staff nurse's use; one with a window and one windowless. Medium pouches are not effective to hold a seal at this time and large should be used. Thank-you,  Julien Girt MSN, [place], [place], [place], [place]

## 2022-06-10 NOTE — Progress Notes (Signed)
[facility] SURGICAL PROGRESS NOTE (cpt [phone])  Hospital Day(s): 117.   Post op day(s): 115 Days Post-Op.   Interval History:  Patient seen and examined No issues with current [name] system; RN staff doing a fantastic job managing effluent No new labs [name] Pouch output unmeasured in last 24 hours She is on soft diet; on TPN, now nightly Ambulating independently   Review of Systems:  Constitutional: denies fever, chills  HEENT: denies cough or congestion  Respiratory: denies any shortness of breath  Cardiovascular: denies chest pain or palpitations  Gastrointestinal: denies abdominal pain, N/V Genitourinary: denies burning with urination or urinary frequency Integumentary: + midline wound  Vital signs in last 24 hours: [min-max] current  Temp:  [97.9 F (36.6 C)-98.4 F (36.9 C)] 98.2 F (36.8 C) (10/03 0251) Pulse Rate:  [73-86] 86 (10/03 0251) Resp:  [16-20] 20 (10/03 0251) BP: (103-123)/(45-62) 107/62 (10/03 0251) SpO2:  [97 %-100 %] 100 % (10/03 0251) Weight:  [98.4 kg] 98.4 kg (10/03 0500)     Height: '4\' 11"'$  (149.9 cm) Weight: 98.4 kg BMI (Calculated): 43.04   Intake/Output last 2 shifts:  10/02 0701 - 10/03 0700 In: 1838 [P.O.:120; I.V.:1718] Out: -    Physical Exam:  Constitutional: alert, cooperative and no distress  Respiratory: breathing non-labored at rest  Cardiovascular: regular rate and sinus rhythm  Gastrointestinal: Soft, abdominal soreness on the right, non-distended, no rebound/guarding. Integumentary: Midline wound open, peritoneum closed; granulating;  there are three stomatized areas visible in the LUQ and LLQ portions of the wound, output remains feculent. There is hypergranulation tissue to the inferior wound edge. No further bleeding    Labs:     Latest Ref Rng & Units 05/29/2022    7:17 PM 05/29/2022    4:52 AM 05/26/2022    5:09 AM  CBC  WBC 4.0 - 10.5 K/uL  3.1  3.2   Hemoglobin 12.0 - 15.0 g/dL 9.3  6.8  8.3    Hematocrit 36.0 - 46.0 % 30.3  22.2  27.3   Platelets 150 - 400 K/uL  70  90       Latest Ref Rng & Units 06/09/2022    4:52 AM 06/05/2022    4:58 AM 06/04/2022    7:18 AM  CMP  Glucose 70 - 99 mg/dL 156  162  189   BUN 6 - 20 mg/dL '18  20  19   '$ Creatinine 0.44 - 1.00 mg/dL 0.41  0.49  0.47   Sodium 135 - 145 mmol/L 134  134  135   Potassium 3.5 - 5.1 mmol/L 4.2  3.9  4.3   Chloride 98 - 111 mmol/L 104  106  107   CO2 22 - 32 mmol/L '23  24  24   '$ Calcium 8.9 - 10.3 mg/dL 8.0  8.5  8.2   Total Protein 6.5 - 8.1 g/dL 6.2  6.9    Total Bilirubin 0.3 - 1.2 mg/dL 0.5  0.5    Alkaline Phos 38 - 126 U/L 103  117    AST 15 - 41 U/L 27  25    ALT 0 - 44 U/L 16  17      Imaging studies: No new pertinent imaging studies   Assessment/Plan:  56 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   - New 10/03: No issues   - Wound Care: [name] pouch changed 10/02 secondary to failure. Now back to windowless pouch but WITHOUT red rubber. She will need intermittent suction with Yankauer. Likely be beneficial to suction before ambulation. Changed 10/02   - On Soft diet + nutritional supplementation - Continue TPN; transition to QHS - Appreciate dietary assistance with trace elements; iron supplementation            - Monitor abdominal examination; on-going bowel function            - Pain control prn; antemetic prn            - Out of bed; discharged from therapies; no recommendations at this time   - Discharge Planning: Anticipate lengthy admission and potential eventual transfer.    All of the above findings and recommendations were discussed with the patient, and the medical team, and all of patient's questions were answered to her  expressed satisfaction.  -- [name], PA-C [facility] 06/10/2022, 7:21 AM M-F: 7am - 4pm

## 2022-06-10 NOTE — Plan of Care (Signed)
  Problem: Activity: Goal: Risk for activity intolerance will decrease Outcome: Progressing   Problem: Nutrition: Goal: Adequate nutrition will be maintained Outcome: Progressing   Problem: Pain Managment: Goal: General experience of comfort will improve Outcome: Progressing   

## 2022-06-10 NOTE — Progress Notes (Signed)
PHARMACIST - PHYSICIAN COMMUNICATION  CONCERNING: IV to Oral Route Change Policy  RECOMMENDATION: This patient is receiving pantoprazole by the intravenous route.  Based on criteria approved by the Pharmacy and Therapeutics Committee, the intravenous medication(s) is/are being converted to the equivalent oral dose form(s).   DESCRIPTION: These criteria include: The patient is eating (either orally or via tube) and/or has been taking other orally administered medications for a least 24 hours The patient has no evidence of active gastrointestinal bleeding or impaired GI absorption (gastrectomy, short bowel, patient on TNA or NPO).  If you have questions about this conversion, please contact the [place], [facility] 06/10/2022 11:10 AM

## 2022-06-11 LAB — GLUCOSE, CAPILLARY
Glucose-Capillary: 174 mg/dL — ABNORMAL HIGH (ref 70–99)
Glucose-Capillary: 164 mg/dL — ABNORMAL HIGH (ref 70–99)
Glucose-Capillary: 144 mg/dL — ABNORMAL HIGH (ref 70–99)
Glucose-Capillary: 109 mg/dL — ABNORMAL HIGH (ref 70–99)
Glucose-Capillary: 134 mg/dL — ABNORMAL HIGH (ref 70–99)

## 2022-06-11 LAB — MISC LABCORP TEST (SEND OUT): Labcorp test code: 81034

## 2022-06-11 MED ORDER — ZINC CHLORIDE 1 MG/ML IV SOLN
INTRAVENOUS | Status: AC
  Filled 2022-06-11: qty 681.33

## 2022-06-11 NOTE — Progress Notes (Signed)
[facility] SURGICAL PROGRESS NOTE (cpt [id number])  Hospital Day(s): 118.   Post op day(s): 116 Days Post-Op.   Interval History:  Patient seen and examined No issues with current [name] system; RN staff doing a fantastic job managing effluent No new labs [name] Pouch output 950 ccs + unmeasured in last 24 hours She is on soft diet; on TPN, now nightly Ambulating independently   Review of Systems:  Constitutional: denies fever, chills  HEENT: denies cough or congestion  Respiratory: denies any shortness of breath  Cardiovascular: denies chest pain or palpitations  Gastrointestinal: denies abdominal pain, N/V Genitourinary: denies burning with urination or urinary frequency Integumentary: + midline wound  Vital signs in last 24 hours: [min-max] current  Temp:  [98.1 F (36.7 C)-98.3 F (36.8 C)] 98.1 F (36.7 C) (10/04 0326) Pulse Rate:  [79-93] 86 (10/04 0326) Resp:  [17-19] 17 (10/04 0326) BP: (100-133)/(55-63) 102/57 (10/04 0326) SpO2:  [97 %-100 %] 97 % (10/04 0326) Weight:  [98.4 kg] 98.4 kg (10/04 0326)     Height: '4\' 11"'$  (149.9 cm) Weight: 98.4 kg BMI (Calculated): 43.04   Intake/Output last 2 shifts:  10/03 0701 - 10/04 0700 In: 1395.1 [P.O.:360; I.V.:1035.1] Out: 950 [Drains:950]   Physical Exam:  Constitutional: alert, cooperative and no distress  Respiratory: breathing non-labored at rest  Cardiovascular: regular rate and sinus rhythm  Gastrointestinal: Soft, abdominal soreness on the right, non-distended, no rebound/guarding. Integumentary: Midline wound open, peritoneum closed; granulating;  there are three stomatized areas visible in the LUQ and LLQ portions of the wound, output remains feculent. There is hypergranulation tissue to the inferior wound edge. No further bleeding    Labs:     Latest Ref Rng & Units 05/29/2022    7:17 PM 05/29/2022    4:52 AM 05/26/2022    5:09 AM  CBC  WBC 4.0 - 10.5 K/uL  3.1  3.2   Hemoglobin 12.0 - 15.0 g/dL  9.3  6.8  8.3   Hematocrit 36.0 - 46.0 % 30.3  22.2  27.3   Platelets 150 - 400 K/uL  70  90       Latest Ref Rng & Units 06/09/2022    4:52 AM 06/05/2022    4:58 AM 06/04/2022    7:18 AM  CMP  Glucose 70 - 99 mg/dL 156  162  189   BUN 6 - 20 mg/dL '18  20  19   '$ Creatinine 0.44 - 1.00 mg/dL 0.41  0.49  0.47   Sodium 135 - 145 mmol/L 134  134  135   Potassium 3.5 - 5.1 mmol/L 4.2  3.9  4.3   Chloride 98 - 111 mmol/L 104  106  107   CO2 22 - 32 mmol/L '23  24  24   '$ Calcium 8.9 - 10.3 mg/dL 8.0  8.5  8.2   Total Protein 6.5 - 8.1 g/dL 6.2  6.9    Total Bilirubin 0.3 - 1.2 mg/dL 0.5  0.5    Alkaline Phos 38 - 126 U/L 103  117    AST 15 - 41 U/L 27  25    ALT 0 - 44 U/L 16  17      Imaging studies: No new pertinent imaging studies   Assessment/Plan:  56 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   - New 10/04: No issues   - Wound Care: [name] pouch changed 10/02 secondary to failure. Now back to windowless pouch but WITHOUT red rubber. She will need intermittent suction with Yankauer. Likely be beneficial to suction before ambulation. Changed 10/02   - On Soft diet + nutritional supplementation - Continue TPN; transition to QHS - Appreciate dietary assistance with trace elements; iron supplementation            - Monitor abdominal examination; on-going bowel function            - Pain control prn; antemetic prn            - Out of bed; discharged from therapies; no recommendations at this time   - Discharge Planning: Anticipate lengthy admission and potential eventual transfer.    All of the above findings and recommendations were discussed with the patient, and the medical team, and all of patient's questions were  answered to her expressed satisfaction.  -- Debra Simon, PA-C [facility] 06/11/2022, 7:31 AM M-F: 7am - 4pm

## 2022-06-11 NOTE — Progress Notes (Signed)
Mobility Specialist - Progress Note   06/11/22 1508  Mobility  Activity Ambulated independently in room  Activity Response Tolerated well  Distance Ambulated (ft) 8 ft  $Mobility charge 1 Mobility  Level of Assistance Independent  Assistive Device None  Mobility Referral Yes   [place]ie [place] Mobility Specialist 06/11/22 3:15 PM

## 2022-06-12 LAB — COMPREHENSIVE METABOLIC PANEL
ALT: 18 U/L (ref 0–44)
AST: 30 U/L (ref 15–41)
Albumin: 3.1 g/dL — ABNORMAL LOW (ref 3.5–5.0)
Alkaline Phosphatase: 118 U/L (ref 38–126)
Anion gap: 7 (ref 5–15)
BUN: 17 mg/dL (ref 6–20)
CO2: 25 mmol/L (ref 22–32)
Calcium: 8.5 mg/dL — ABNORMAL LOW (ref 8.9–10.3)
Chloride: 103 mmol/L (ref 98–111)
Creatinine, Ser: 0.48 mg/dL (ref 0.44–1.00)
GFR, Estimated: >60 mL/min (ref >60)
Glucose, Bld: 159 mg/dL — ABNORMAL HIGH (ref 70–99)
Potassium: 3.9 mmol/L (ref 3.5–5.1)
Sodium: 135 mmol/L (ref 135–145)
Total Bilirubin: 0.5 mg/dL (ref 0.3–1.2)
Total Protein: 7.2 g/dL (ref 6.5–8.1)

## 2022-06-12 LAB — GLUCOSE, CAPILLARY
Glucose-Capillary: 162 mg/dL — ABNORMAL HIGH (ref 70–99)
Glucose-Capillary: 193 mg/dL — ABNORMAL HIGH (ref 70–99)
Glucose-Capillary: 168 mg/dL — ABNORMAL HIGH (ref 70–99)
Glucose-Capillary: 106 mg/dL — ABNORMAL HIGH (ref 70–99)
Glucose-Capillary: 104 mg/dL — ABNORMAL HIGH (ref 70–99)
Glucose-Capillary: 97 mg/dL (ref 70–99)
Glucose-Capillary: 133 mg/dL — ABNORMAL HIGH (ref 70–99)

## 2022-06-12 LAB — MAGNESIUM: Magnesium: 1.8 mg/dL (ref 1.7–2.4)

## 2022-06-12 LAB — PHOSPHORUS: Phosphorus: 3.2 mg/dL (ref 2.5–4.6)

## 2022-06-12 LAB — VITAMIN C: Vitamin C: 0.8 mg/dL (ref 0.4–2.0)

## 2022-06-12 LAB — TRIGLYCERIDES: Triglycerides: 120 mg/dL (ref <150)

## 2022-06-12 MED ORDER — ZINC CHLORIDE 1 MG/ML IV SOLN
INTRAVENOUS | Status: AC
  Filled 2022-06-12: qty 681.33

## 2022-06-12 MED ORDER — ZINC CHLORIDE 1 MG/ML IV SOLN
INTRAVENOUS | Status: DC
  Filled 2022-06-12: qty 681.33

## 2022-06-12 NOTE — Progress Notes (Signed)
PHARMACY - TOTAL PARENTERAL NUTRITION CONSULT NOTE   Indication: Prolonged ileus  Patient Measurements: Height: '4\' 11"'$  (149.9 cm) Weight: 98.6 kg (217 lb 6 oz) IBW/kg (Calculated) : 43.2 TPN AdjBW (KG): 55.2 Body mass index is 43.9 kg/m.  Assessment: Debra [name] is a 56 y.o. female s/p laparotomy, excision of greater omental mass, abdominal wall reconstruction with Debra [name] release, appendectomy, and placement of Prevena vac.  Glucose / Insulin: No apparent history of diabetes. Blood glucoses have been well-controlled on current insulin regimen (SSI 4x/day + 12u insulin in TPN) Electrolytes: Magnesium persistently at LLN Renal: Scr stable (0.3-0.6) Hepatic: No transaminitis. LFTs within normal limits. TG within normal limits  GI Imaging: 9/11 CTAP: no new acute issues GI Surgeries / Procedures: s/p laparotomy, excision of greater omental mass, abdominal wall reconstruction with Debra [name] release, appendectomy, and placement of Prevena vac  Central access: 02/15/22 TPN start date: 02/15/22  Nutritional Goals: Goal cyclic TPN over 14 hrs: Start Day 1 with cyclic TPN over 18 hrs: (provides 91 g of protein and 1622 kcals per day) total volume over 14hrs for calculations=1400 ml(1500 ml total with overfill)  RD Assessment:  Estimated Needs Total Energy Estimated Needs: 1800-2100kcal/day Total Protein Estimated Needs: 90-110g/d Total Fluid Estimated Needs: 1.4-1.6L/day  Current Nutrition:  Soft diet + nutritional supplements, not meeting PO needs yet   Plan: Transitioned to *Cyclic* TPN on 4/69. Run over 18 hrs for 1st day, tolerated well, now changed to run over 14 hours  per MD.  Day 5 (9/29):  Start rate at 57 mL/hr for 1 hour.   Increase rate to 114 mL/hr for 11 hours.   Decrease rate to 57 mL/hr for 1 hour.  Decrease rate to 29 mL/hr for 1 hour, then stop.    Nutritional Components Amino acids (using 15% Clinisol): 102 g Dextrose 19% = 266 g Lipids  (using 20% SMOFlipids): 50.4 g kCal: 1817/24h  Electrolytes in TPN (standard): Na 357mq/L, K 529m/L, Ca 57m457mL, Mg 10 mEq/L (max recommended), Phos 10 mmol/L. Cl:Ac 1:1 Enteral multivitamin. Added thiamine 100 mg back to TPN as B1 level deficient. Added back trace elements and chromium per discussion with RD. Decreased Zinc to 10 mg New CBG/SSI for Cyclic TPN: SSI  -CBG 2 hrs after cyclic TPN start -CBG during middle of cyclic TPN infusion -CBG 1 hr after cylic TPN stopped -CBG while off TPN (=4 CBGs per 24 hr period) + 12u insulin in TPN Check TPN labs on Mon/Thurs at minimum  Thank you for allowing pharmacy to be a part of this patient's care.  [name]/01/2022 9:53 AM

## 2022-06-12 NOTE — Progress Notes (Signed)
[facility] SURGICAL PROGRESS NOTE (cpt [phone])  Hospital Day(s): 119.   Post op day(s): 117 Days Post-Op.   Interval History:  Patient seen and examined No issues with current [name] system; RN staff doing a fantastic job managing effluent Nutritional labs are reassuring; no electrolyte derangements  [name] Pouch output 750 ccs + unmeasured in last 24 hours She is on soft diet; on TPN, now nightly Ambulating independently   Review of Systems:  Constitutional: denies fever, chills  HEENT: denies cough or congestion  Respiratory: denies any shortness of breath  Cardiovascular: denies chest pain or palpitations  Gastrointestinal: denies abdominal pain, N/V Genitourinary: denies burning with urination or urinary frequency Integumentary: + midline wound  Vital signs in last 24 hours: [min-max] current  Temp:  [98.3 F (36.8 C)-98.5 F (36.9 C)] 98.4 F (36.9 C) (10/05 0403) Pulse Rate:  [81-82] 81 (10/05 0403) Resp:  [17-19] 18 (10/05 0403) BP: (96-102)/(54-61) 101/54 (10/05 0403) SpO2:  [94 %-100 %] 97 % (10/05 0403) Weight:  [98.6 kg] 98.6 kg (10/05 0424)     Height: '4\' 11"'$  (149.9 cm) Weight: 98.6 kg BMI (Calculated): 43.04   Intake/Output last 2 shifts:  10/04 0701 - 10/05 0700 In: 987.5 [I.V.:987.5] Out: 750 [Drains:750]   Physical Exam:  Constitutional: alert, cooperative and no distress  Respiratory: breathing non-labored at rest  Cardiovascular: regular rate and sinus rhythm  Gastrointestinal: Soft, abdominal soreness on the right, non-distended, no rebound/guarding. Integumentary: Midline wound open, peritoneum closed; granulating;  there are three stomatized areas visible in the LUQ and LLQ portions of the wound, output remains feculent. There is hypergranulation tissue to the inferior wound edge. No further bleeding    Labs:     Latest Ref Rng & Units 05/29/2022    7:17 PM 05/29/2022    4:52 AM 05/26/2022    5:09 AM  CBC  WBC 4.0 - 10.5 K/uL   3.1  3.2   Hemoglobin 12.0 - 15.0 g/dL 9.3  6.8  8.3   Hematocrit 36.0 - 46.0 % 30.3  22.2  27.3   Platelets 150 - 400 K/uL  70  90       Latest Ref Rng & Units 06/12/2022    5:46 AM 06/09/2022    4:52 AM 06/05/2022    4:58 AM  CMP  Glucose 70 - 99 mg/dL 159  156  162   BUN 6 - 20 mg/dL '17  18  20   '$ Creatinine 0.44 - 1.00 mg/dL 0.48  0.41  0.49   Sodium 135 - 145 mmol/L 135  134  134   Potassium 3.5 - 5.1 mmol/L 3.9  4.2  3.9   Chloride 98 - 111 mmol/L 103  104  106   CO2 22 - 32 mmol/L '25  23  24   '$ Calcium 8.9 - 10.3 mg/dL 8.5  8.0  8.5   Total Protein 6.5 - 8.1 g/dL 7.2  6.2  6.9   Total Bilirubin 0.3 - 1.2 mg/dL 0.5  0.5  0.5   Alkaline Phos 38 - 126 U/L 118  103  117   AST 15 - 41 U/L '30  27  25   '$ ALT 0 - 44 U/L '18  16  17     '$ Imaging studies: No new pertinent imaging studies   Assessment/Plan:  56 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   - New 10/05: No issues   - Wound Care: [name] pouch changed 10/02 secondary to failure. Now back to windowless pouch but WITHOUT red rubber. She will need intermittent suction with Yankauer. Likely be beneficial to suction before ambulation. Changed 10/02   - On Soft diet + nutritional supplementation - Continue TPN; transition to QHS - Appreciate dietary assistance with trace elements; iron supplementation            - Monitor abdominal examination; on-going bowel function            - Pain control prn; antemetic prn            - Out of bed; discharged from therapies; no recommendations at this time   - Discharge Planning: Anticipate lengthy admission and potential eventual transfer.    All of the above findings and recommendations were discussed with the patient, and  the medical team, and all of patient's questions were answered to her expressed satisfaction.  -- [name], PA-C [facility] 06/12/2022, 7:46 AM M-F: 7am - 4pm

## 2022-06-12 NOTE — Progress Notes (Signed)
Subjective:  CC: Debra [name] is a 56 y.o. female  Hospital stay day 119, 117 Days Post-Op  s/p re-opening of laparotomy for repair of small bowel perforation following initial laparotomy, excision of greater omental mass, abdominal wall reconstruction with [name], appendectomy, and placement of Prevena vac on 06/08.  HPI: Called to bedside by nursing tonight due to increased bleeding within [name]'s pouch.  Patient denies any pain.  ROS:  General: Denies weight loss, weight gain, fatigue, fevers, chills, and night sweats. Heart: Denies chest pain, palpitations, racing heart, irregular heartbeat, leg pain or swelling, and decreased activity tolerance. Respiratory: Denies breathing difficulty, shortness of breath, wheezing, cough, and sputum. GI: Denies change in appetite, heartburn, nausea, vomiting, constipation, diarrhea, and blood in stool. GU: Denies difficulty urinating, pain with urinating, urgency, frequency, blood in urine.   Objective:   Temp:  [97.9 F (36.6 C)-98.4 F (36.9 C)] 97.9 F (36.6 C) (10/05 1941) Pulse Rate:  [76-81] 76 (10/05 1941) Resp:  [18-20] 20 (10/05 1941) BP: (98-115)/(54-64) 115/57 (10/05 1941) SpO2:  [97 %-100 %] 100 % (10/05 1941) Weight:  [98.6 kg] 98.6 kg (10/05 0424)     Height: '4\' 11"'$  (149.9 cm) Weight: 98.6 kg BMI (Calculated): 43.04   Intake/Output this shift:   Intake/Output Summary (Last 24 hours) at 06/12/2022 2351 Last data filed at 06/12/2022 1700 Gross per 24 hour  Intake 927.1 ml  Output 1150 ml  Net -222.9 ml    Constitutional :  alert, cooperative, appears stated age, and no distress  Respiratory:  clear to auscultation bilaterally  Cardiovascular:  regular rate and rhythm  Gastrointestinal: soft, non-tender; bowel sounds normal; no masses,  no organomegaly.   Skin: Cool and moist.  Midline wound with [name]'s pouch filled with softball size hematoma at the base.  Pouch was removed and after hematoma  evacuation, noted about a dime size area of granulation tissue at the base with nonpulsatile dark venous bleeding.  Rate of bleeding moderate.  Psychiatric: Normal affect, non-agitated, not confused       LABS:     Latest Ref Rng & Units 06/12/2022    5:46 AM 06/09/2022    4:52 AM 06/05/2022    4:58 AM  CMP  Glucose 70 - 99 mg/dL 159  156  162   BUN 6 - 20 mg/dL '17  18  20   '$ Creatinine 0.44 - 1.00 mg/dL 0.48  0.41  0.49   Sodium 135 - 145 mmol/L 135  134  134   Potassium 3.5 - 5.1 mmol/L 3.9  4.2  3.9   Chloride 98 - 111 mmol/L 103  104  106   CO2 22 - 32 mmol/L '25  23  24   '$ Calcium 8.9 - 10.3 mg/dL 8.5  8.0  8.5   Total Protein 6.5 - 8.1 g/dL 7.2  6.2  6.9   Total Bilirubin 0.3 - 1.2 mg/dL 0.5  0.5  0.5   Alkaline Phos 38 - 126 U/L 118  103  117   AST 15 - 41 U/L '30  27  25   '$ ALT 0 - 44 U/L '18  16  17       '$ Latest Ref Rng & Units 05/29/2022    7:17 PM 05/29/2022    4:52 AM 05/26/2022    5:09 AM  CBC  WBC 4.0 - 10.5 K/uL  3.1  3.2   Hemoglobin 12.0 - 15.0 g/dL 9.3  6.8  8.3   Hematocrit 36.0 - 46.0 %  30.3  22.2  27.3   Platelets 150 - 400 K/uL  70  90     RADS: N/a Assessment:    s/p re-opening of laparotomy for repair of small bowel perforation following initial laparotomy, excision of greater omental mass, abdominal wall reconstruction with [name], appendectomy, and placement of Prevena vac on 06/08.  Presenting acutely with bleeding from midline abdominal wound base.  After obtaining verbal consent, several applications of silver nitrate applied to obviously bleeding area to slow down bleeding.  Complete hemostasis was unable to be achieved with silver nitrate alone, so Surgicel was placed over the area.  Manual pressure then applied with 4 x 4 gauze with Surgicel still in place for over 15 minutes.  No sign of bleeding noted through the gauze afterwards.  [name]'s pouch with window reapplied in case bleeding recurs overnight for easier access.  Instructed nurse  to leave the gauze in place for pressure type dressing in the meantime.  Patient tolerated procedure well with minimal complaints of burning in the area.  Encounter done with interpreter present.  labs/images/medications/previous chart entries reviewed personally and relevant changes/updates noted above.

## 2022-06-12 NOTE — Consult Note (Signed)
[place] Nurse wound follow up Current [name] pouch is intact with good seal over abd full thickness wound with fistulas; mod amt brown liquid, no wall suction attached. Nurses performing intermittent suction by inserting tube PRN. Supplies at bedside and instructions have been provided if they need to change the pouch PRN if leaking. Thank-you,  Julien Girt MSN, [place], [place], [place], [place]

## 2022-06-13 LAB — GLUCOSE, CAPILLARY
Glucose-Capillary: 171 mg/dL — ABNORMAL HIGH (ref 70–99)
Glucose-Capillary: 184 mg/dL — ABNORMAL HIGH (ref 70–99)
Glucose-Capillary: 156 mg/dL — ABNORMAL HIGH (ref 70–99)
Glucose-Capillary: 129 mg/dL — ABNORMAL HIGH (ref 70–99)
Glucose-Capillary: 135 mg/dL — ABNORMAL HIGH (ref 70–99)

## 2022-06-13 MED ORDER — ZINC CHLORIDE 1 MG/ML IV SOLN
INTRAVENOUS | Status: AC
  Filled 2022-06-13: qty 681.33

## 2022-06-13 MED ORDER — FUROSEMIDE 10 MG/ML IJ SOLN
20.0000 mg | Freq: Once | INTRAMUSCULAR | Status: AC
  Administered 2022-06-13: 20 mg via INTRAVENOUS
  Filled 2022-06-13: qty 4

## 2022-06-13 NOTE — TOC Progression Note (Signed)
Transition of Care [facility]) - Progression Note    Patient Details  Name: Debra [name] MRN: [id number] Date of Birth: 29-Dec-1965  Transition of Care [facility]) CM/SW [place], LCSW Phone Number: 06/13/2022, 12:46 PM  Clinical Narrative: TOC continues to follow for disposition needs.    Expected Discharge Plan: [place] Barriers to Discharge: Continued Medical Work up  Expected Discharge Plan and Services Expected Discharge Plan: [place]                                               Social Determinants of Health (SDOH) Interventions    Readmission Risk Interventions     No data to display

## 2022-06-13 NOTE — Progress Notes (Signed)
PHARMACY - TOTAL PARENTERAL NUTRITION CONSULT NOTE   Indication: Prolonged ileus  Patient Measurements: Height: '4\' 11"'$  (149.9 cm) Weight: 98.5 kg (217 lb 2.5 oz) IBW/kg (Calculated) : 43.2 TPN AdjBW (KG): 55.2 Body mass index is 43.86 kg/m.  Assessment: Debra [name] is a 56 y.o. female s/p laparotomy, excision of greater omental mass, abdominal wall reconstruction with [name], appendectomy, and placement of Prevena vac.  Patient given lasix 20 mg IV x 1 10/6 for edema  Glucose / Insulin: No apparent history of diabetes. Blood glucoses have been well-controlled on current insulin regimen (SSI 4x/day + 12u insulin in TPN) Electrolytes: Magnesium persistently at LLN Renal: Scr stable (0.3-0.6) Hepatic: No transaminitis. LFTs within normal limits. TG within normal limits  GI Imaging: 9/11 CTAP: no new acute issues GI Surgeries / Procedures: s/p laparotomy, excision of greater omental mass, abdominal wall reconstruction with [name], appendectomy, and placement of Prevena vac  Central access: 02/15/22 TPN start date: 02/15/22  Nutritional Goals: Goal cyclic TPN over 14 hrs: Start Day 1 with cyclic TPN over 18 hrs: (provides 91 g of protein and 1622 kcals per day) total volume over 14hrs for calculations=1400 ml(1500 ml total with overfill)  RD Assessment:  Estimated Needs Total Energy Estimated Needs: 1800-2100kcal/day Total Protein Estimated Needs: 90-110g/d Total Fluid Estimated Needs: 1.4-1.6L/day  Current Nutrition:  Soft diet + nutritional supplements, not meeting PO needs yet   Plan: Transitioned to *Cyclic* TPN on 5/24. Run over 18 hrs for 1st day, tolerated well, now changed to run over 14 hours  per MD.  Day 12 (10/6):  Start rate at 57 mL/hr for 1 hour.   Increase rate to 114 mL/hr for 11 hours.   Decrease rate to 57 mL/hr for 1 hour.  Decrease rate to 29 mL/hr for 1 hour, then stop.    Nutritional Components Amino acids  (using 15% Clinisol): 102 g Dextrose 19% = 266 g Lipids (using 20% SMOFlipids): 50.4 g kCal: 1817/24h  Electrolytes in TPN (standard): Na 20mq/L, K 572m/L, Ca 29m19mL, Mg 10 mEq/L (max recommended), Phos 10 mmol/L. Cl:Ac 1:1 Enteral multivitamin. Added thiamine 100 mg back to TPN as B1 level deficient. Continue trace elements and chromium per discussion with RD. Continue Zinc 10 mg Continue CBG/SSI for Cyclic TPN: SSI  -CBG 2 hrs after cyclic TPN start -CBG during middle of cyclic TPN infusion -CBG 1 hr after cylic TPN stopped -CBG while off TPN (=4 CBGs per 24 hr period) + 12u insulin in TPN Check TPN labs on Mon/Thurs at minimum Monitor for further need of diuresis and adjust as needed for fluid status  Thank you for allowing pharmacy to be a part of this patient's care.  HowLorin Picket/02/2022 11:17 AM

## 2022-06-13 NOTE — Progress Notes (Signed)
[facility] SURGICAL PROGRESS NOTE (cpt [phone])  Hospital Day(s): 120.   Post op day(s): 118 Days Post-Op.   Interval History:  Patient seen and examined Overnight, issues with bleeding from wound; controlled with silver nitrate and surgicel. New [name] placed (with window).  RN staff also worried about lower extremity edema; not ambulating as well as previous days [name] Pouch output 400 ccs + unmeasured in last 24 hours She is on soft diet; on TPN, now nightly Ambulating independently   Review of Systems:  Constitutional: denies fever, chills  HEENT: denies cough or congestion  Respiratory: denies any shortness of breath  Cardiovascular: denies chest pain or palpitations  Gastrointestinal: denies abdominal pain, N/V Genitourinary: denies burning with urination or urinary frequency Integumentary: + midline wound  Vital signs in last 24 hours: [min-max] current  Temp:  [97.9 F (36.6 C)-98.3 F (36.8 C)] 98.1 F (36.7 C) (10/06 0303) Pulse Rate:  [76-87] 87 (10/06 0303) Resp:  [18-20] 20 (10/06 0303) BP: (98-115)/(54-64) 104/54 (10/06 0303) SpO2:  [96 %-100 %] 96 % (10/06 0303) Weight:  [98.5 kg] 98.5 kg (10/06 0303)     Height: '4\' 11"'$  (149.9 cm) Weight: 98.5 kg BMI (Calculated): 43.04   Intake/Output last 2 shifts:  10/05 0701 - 10/06 0700 In: 913 [I.V.:913] Out: 400 [Drains:400]   Physical Exam:  Constitutional: alert, cooperative and no distress  Respiratory: breathing non-labored at rest  Cardiovascular: regular rate and sinus rhythm  Gastrointestinal: Soft, abdominal soreness on the right, non-distended, no rebound/guarding. Integumentary: Midline wound open, peritoneum closed; granulating;  there are three stomatized areas visible in the LUQ and LLQ portions of the wound, output remains feculent. There is hypergranulation tissue to the inferior wound edge. No further bleeding this morning Musculoskeletal: Trace to 1+ edema to bilateral LE      Labs:     Latest Ref Rng & Units 05/29/2022    7:17 PM 05/29/2022    4:52 AM 05/26/2022    5:09 AM  CBC  WBC 4.0 - 10.5 K/uL  3.1  3.2   Hemoglobin 12.0 - 15.0 g/dL 9.3  6.8  8.3   Hematocrit 36.0 - 46.0 % 30.3  22.2  27.3   Platelets 150 - 400 K/uL  70  90       Latest Ref Rng & Units 06/12/2022    5:46 AM 06/09/2022    4:52 AM 06/05/2022    4:58 AM  CMP  Glucose 70 - 99 mg/dL 159  156  162   BUN 6 - 20 mg/dL '17  18  20   '$ Creatinine 0.44 - 1.00 mg/dL 0.48  0.41  0.49   Sodium 135 - 145 mmol/L 135  134  134   Potassium 3.5 - 5.1 mmol/L 3.9  4.2  3.9   Chloride 98 - 111 mmol/L 103  104  106   CO2 22 - 32 mmol/L '25  23  24   '$ Calcium 8.9 - 10.3 mg/dL 8.5  8.0  8.5   Total Protein 6.5 - 8.1 g/dL 7.2  6.2  6.9   Total Bilirubin 0.3 - 1.2 mg/dL 0.5  0.5  0.5   Alkaline Phos 38 - 126 U/L 118  103  117   AST 15 - 41 U/L '30  27  25   '$ ALT 0 - 44 U/L '18  16  17     '$ Imaging studies: No new pertinent imaging studies   Assessment/Plan:  56 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   - New 10/06: Bleeding overnight controlled; will hold Lovenox today. Given one-time dose of Lasix for edema.   - Wound Care: [name] pouch changed 10/05 secondary to bleeding from wound; now controlled. Back to [name] pouch with "window" but not on intermittent or continuous suction. Patient seems to like this better. She will need intermittent suction with Yankauer to control effluent. Likely be beneficial to suction before ambulation. Changed 10/05   - On Soft diet + nutritional supplementation - Continue TPN; transition to QHS - Appreciate dietary assistance with trace elements; iron supplementation            - Monitor abdominal  examination; on-going bowel function            - Pain control prn; antemetic prn            - Out of bed; discharged from therapies; no recommendations at this time   - Discharge Planning: Anticipate lengthy admission and potential eventual transfer; no new progress    All of the above findings and recommendations were discussed with the patient, and the medical team, and all of patient's questions were answered to her expressed satisfaction.  -- [name], PA-C [facility] 06/13/2022, 7:20 AM M-F: 7am - 4pm

## 2022-06-14 DIAGNOSIS — K632 Fistula of intestine: Principal | ICD-10-CM | POA: Diagnosis not present

## 2022-06-14 LAB — GLUCOSE, CAPILLARY
Glucose-Capillary: 166 mg/dL — ABNORMAL HIGH (ref 70–99)
Glucose-Capillary: 137 mg/dL — ABNORMAL HIGH (ref 70–99)
Glucose-Capillary: 112 mg/dL — ABNORMAL HIGH (ref 70–99)
Glucose-Capillary: 128 mg/dL — ABNORMAL HIGH (ref 70–99)

## 2022-06-14 MED ORDER — SILVER NITRATE-POT NITRATE 75-25 % EX MISC
1.0000 | CUTANEOUS | Status: DC | PRN
  Filled 2022-06-14 (×2): qty 1
  Filled 2022-06-14: qty 10

## 2022-06-14 MED ORDER — ZINC CHLORIDE 1 MG/ML IV SOLN
INTRAVENOUS | Status: AC
  Filled 2022-06-14: qty 681.33

## 2022-06-14 NOTE — Progress Notes (Signed)
PHARMACY - TOTAL PARENTERAL NUTRITION CONSULT NOTE   Indication: Prolonged ileus  Patient Measurements: Height: '4\' 11"'$  (149.9 cm) Weight: 99.3 kg (218 lb 14.7 oz) IBW/kg (Calculated) : 43.2 TPN AdjBW (KG): 55.2 Body mass index is 44.22 kg/m.  Assessment: Debra [name] is a 56 y.o. female s/p laparotomy, excision of greater omental mass, abdominal wall reconstruction with [name], appendectomy, and placement of Prevena vac.  Glucose / Insulin: No apparent history of diabetes. Blood glucoses have been well-controlled on current insulin regimen (SSI 4x/day + 12u insulin in TPN) Electrolytes: Magnesium persistently at LLN Renal: Scr stable (0.3-0.6) Hepatic: No transaminitis. LFTs within normal limits. TG within normal limits GI Imaging: 9/11 CTAP: no new acute issues GI Surgeries / Procedures: s/p laparotomy, excision of greater omental mass, abdominal wall reconstruction with [name], appendectomy, and placement of Prevena vac  Central access: 02/15/22 TPN start date: 02/15/22  Nutritional Goals: Goal cyclic TPN over 14 hrs: cyclic TPN over 14 hrs: (provides 91 g of protein and 1622 kcals per day) total volume over 14hrs for calculations=1400 ml(1500 ml total with overfill)  RD Assessment:  Estimated Needs Total Energy Estimated Needs: 1800-2100kcal/day Total Protein Estimated Needs: 90-110g/d Total Fluid Estimated Needs: 1.4-1.6L/day  Current Nutrition:  Soft diet + nutritional supplements, not meeting PO needs yet   Plan: Transitioned to *Cyclic* TPN on 2/02. Run over 18 hrs for 1st day, tolerated well, now changed to run over 14 hours  per MD.  Day 12 (10/6):  Start rate at 57 mL/hr for 1 hour.   Increase rate to 114 mL/hr for 11 hours.   Decrease rate to 57 mL/hr for 1 hour.  Decrease rate to 29 mL/hr for 1 hour, then stop.    Nutritional Components Amino acids (using 15% Clinisol): 102 g Dextrose 19% = 266 g Lipids (using 20%  SMOFlipids): 50.4 g kCal: 1817/24h  Electrolytes in TPN (standard): Na 29mq/L, K 541m/L, Ca 6m41mL, Mg 10 mEq/L (max recommended), Phos 10 mmol/L. Cl:Ac 1:1 Enteral multivitamin. Added thiamine 100 mg back to TPN as B1 level deficient. Continue trace elements and chromium per discussion with RD. Continue Zinc 10 mg Continue CBG/SSI for Cyclic TPN: SSI  -CBG 2 hrs after cyclic TPN start -CBG during middle of cyclic TPN infusion -CBG 1 hr after cylic TPN stopped -CBG while off TPN (=4 CBGs per 24 hr period) + 12u insulin in TPN Check TPN labs on Mon/Thurs at minimum Monitor for further need of diuresis and adjust as needed for fluid status  Thank you for allowing pharmacy to be a part of this patient's care.  KisOswald HillockharmD, BCPS 06/14/2022 9:16 AM

## 2022-06-14 NOTE — Progress Notes (Signed)
[facility] SURGICAL PROGRESS NOTE (cpt [id number])  Hospital Day(s): 121.   Post op day(s): 119 Days Post-Op.   Interval History:  No acute events.  Patient ambulating, tolerating diet with night time TPN.  This morning, RN noted more blood clots inside the [name] pouch.  Patient denies any significant pain.   Vital signs in last 24 hours: [min-max] current  Temp:  [98.2 F (36.8 C)-98.5 F (36.9 C)] 98.5 F (36.9 C) (10/07 1644) Pulse Rate:  [74-88] 85 (10/07 1644) Resp:  [17-20] 17 (10/07 0745) BP: (98-103)/(54-62) 102/54 (10/07 1644) SpO2:  [95 %-99 %] 95 % (10/07 1644) Weight:  [99.3 kg] 99.3 kg (10/07 0505)     Height: '4\' 11"'$  (149.9 cm) Weight: 99.3 kg BMI (Calculated): 43.04   Intake/Output last 2 shifts:  10/06 0701 - 10/07 0700 In: 1456.7 [I.V.:1456.7] Out: 1800 [Drains:1800]   Physical Exam:  Constitutional: alert, cooperative and no distress  Respiratory: breathing non-labored at rest  Cardiovascular: regular rate and sinus rhythm  Integumentary: Midline wound open, peritoneum closed; granulating;  there are three stomatized areas visible in the LUQ and LLQ portions of the wound, output remains feculent. There is hypergranulation tissue to the inferior wound edge.  There was oozing from the lower left corner area of the wound/granulation tissue.  This was controlled with silver nitrate sticks over that area, and also applied to the whole wound surface as a precaution (except the stomas).  No further bleeding noted, the wound was irrigated with NS flushes. Musculoskeletal: Trace to 1+ edema to bilateral LE     Labs:     Latest Ref Rng & Units 05/29/2022    7:17 PM 05/29/2022    4:52 AM 05/26/2022    5:09 AM  CBC  WBC 4.0 - 10.5 K/uL  3.1  3.2   Hemoglobin 12.0 - 15.0 g/dL 9.3  6.8  8.3   Hematocrit 36.0 - 46.0 % 30.3  22.2  27.3   Platelets 150 - 400 K/uL  70  90       Latest Ref Rng & Units 06/12/2022    5:46 AM 06/09/2022    4:52 AM 06/05/2022     4:58 AM  CMP  Glucose 70 - 99 mg/dL 159  156  162   BUN 6 - 20 mg/dL '17  18  20   '$ Creatinine 0.44 - 1.00 mg/dL 0.48  0.41  0.49   Sodium 135 - 145 mmol/L 135  134  134   Potassium 3.5 - 5.1 mmol/L 3.9  4.2  3.9   Chloride 98 - 111 mmol/L 103  104  106   CO2 22 - 32 mmol/L '25  23  24   '$ Calcium 8.9 - 10.3 mg/dL 8.5  8.0  8.5   Total Protein 6.5 - 8.1 g/dL 7.2  6.2  6.9   Total Bilirubin 0.3 - 1.2 mg/dL 0.5  0.5  0.5   Alkaline Phos 38 - 126 U/L 118  103  117   AST 15 - 41 U/L '30  27  25   '$ ALT 0 - 44 U/L '18  16  17     '$ Imaging studies: No new pertinent imaging studies   Assessment/Plan:  56 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   Patient had  some oozing from the lower portion of her wound, controlled with silver nitrate sticks.  No complications.  [name] pouch "window" reapplied.  Continue ambulation, diet, TPN.  I spent 35 minutes dedicated to the care of this patient on the date of this encounter to include pre-visit review of records, face-to-face time with the patient discussing diagnosis and management, and any post-visit coordination of care.  Olean Ree, MD [facility]

## 2022-06-15 LAB — GLUCOSE, CAPILLARY
Glucose-Capillary: 171 mg/dL — ABNORMAL HIGH (ref 70–99)
Glucose-Capillary: 209 mg/dL — ABNORMAL HIGH (ref 70–99)
Glucose-Capillary: 185 mg/dL — ABNORMAL HIGH (ref 70–99)
Glucose-Capillary: 99 mg/dL (ref 70–99)
Glucose-Capillary: 154 mg/dL — ABNORMAL HIGH (ref 70–99)

## 2022-06-15 LAB — APTT: aPTT: 34 seconds (ref 24–36)

## 2022-06-15 LAB — PROTIME-INR
INR: 1.3 — ABNORMAL HIGH (ref 0.8–1.2)
Prothrombin Time: 16.4 seconds — ABNORMAL HIGH (ref 11.4–15.2)

## 2022-06-15 MED ORDER — ZINC CHLORIDE 1 MG/ML IV SOLN
INTRAVENOUS | Status: AC
  Filled 2022-06-15: qty 681.33

## 2022-06-15 NOTE — Progress Notes (Signed)
No further bleeding noted in wound bed post bedside cautery. Output minimal and brown in color. Patient denies pain at site.   Fuller Mandril, RN

## 2022-06-15 NOTE — Progress Notes (Signed)
PHARMACY - TOTAL PARENTERAL NUTRITION CONSULT NOTE   Indication: Prolonged ileus  Patient Measurements: Height: '4\' 11"'$  (149.9 cm) Weight: 99.3 kg (218 lb 14.7 oz) IBW/kg (Calculated) : 43.2 TPN AdjBW (KG): 55.2 Body mass index is 44.22 kg/m.  Assessment: Debra [name] is a 56 y.o. female s/p laparotomy, excision of greater omental mass, abdominal wall reconstruction with [name], appendectomy, and placement of Prevena vac.  Glucose / Insulin: No apparent history of diabetes. Blood glucoses have been well-controlled on current insulin regimen (SSI 4x/day + 12u insulin in TPN) Electrolytes: Magnesium persistently at LLN Renal: Scr stable (0.3-0.6) Hepatic: No transaminitis. LFTs within normal limits. TG within normal limits GI Imaging: 9/11 CTAP: no new acute issues GI Surgeries / Procedures: s/p laparotomy, excision of greater omental mass, abdominal wall reconstruction with [name], appendectomy, and placement of Prevena vac  Central access: 02/15/22 TPN start date: 02/15/22  Nutritional Goals: Goal cyclic TPN over 14 hrs: cyclic TPN over 14 hrs: (provides 91 g of protein and 1622 kcals per day) total volume over 14hrs for calculations=1400 ml(1500 ml total with overfill)  RD Assessment:  Estimated Needs Total Energy Estimated Needs: 1800-2100kcal/day Total Protein Estimated Needs: 90-110g/d Total Fluid Estimated Needs: 1.4-1.6L/day  Current Nutrition:  Soft diet + nutritional supplements, not meeting PO needs yet   Plan: Transitioned to *Cyclic* TPN on 6/57. Run over 18 hrs for 1st day, tolerated well, now changed to run over 14 hours  per MD.  Day 12 (10/6):  Start rate at 57 mL/hr for 1 hour.   Increase rate to 114 mL/hr for 11 hours.   Decrease rate to 57 mL/hr for 1 hour.  Decrease rate to 29 mL/hr for 1 hour, then stop.    Nutritional Components Amino acids (using 15% Clinisol): 102 g Dextrose 19% = 266 g Lipids (using 20%  SMOFlipids): 50.4 g kCal: 1817/24h  Electrolytes in TPN (standard): Na 18mq/L, K 583m/L, Ca 61m27mL, Mg 10 mEq/L (max recommended), Phos 10 mmol/L. Cl:Ac 1:1 Enteral multivitamin. Added thiamine 100 mg back to TPN as B1 level deficient. Continue trace elements and chromium per discussion with RD. Continue Zinc 10 mg Continue CBG/SSI for Cyclic TPN: SSI  -CBG 2 hrs after cyclic TPN start -CBG during middle of cyclic TPN infusion -CBG 1 hr after cylic TPN stopped -CBG while off TPN (=4 CBGs per 24 hr period) + 12u insulin in TPN Check TPN labs on Mon/Thurs at minimum Monitor for further need of diuresis and adjust as needed for fluid status  Thank you for allowing pharmacy to be a part of this patient's care.  KisOswald HillockharmD, BCPS 06/15/2022 10:01 AM

## 2022-06-15 NOTE — Plan of Care (Signed)
  Problem: Nutrition: Goal: Adequate nutrition will be maintained Outcome: Progressing   Problem: Activity: Goal: Risk for activity intolerance will decrease Outcome: Progressing   Problem: Pain Managment: Goal: General experience of comfort will improve Outcome: Progressing   

## 2022-06-15 NOTE — Progress Notes (Signed)
[facility] (cpt [phone])  Hospital Day(s): 122.   Post op day(s): 120 Days Post-Op.   Interval History:  Patient had another bleeding episode this morning.  Unable to stop with sutures or silver nitrate, and required cauterization with Bovie at bedside and application of surgicell.  Patient denies any worsening pain.   Vital signs in last 24 hours: [min-max] current  Temp:  [97.9 F (36.6 C)-98.5 F (36.9 C)] 98.4 F (36.9 C) (10/08 0722) Pulse Rate:  [80-86] 86 (10/08 0722) Resp:  [16-20] 20 (10/08 0722) BP: (97-112)/(53-66) 112/53 (10/08 0722) SpO2:  [95 %-100 %] 100 % (10/08 0722)     Height: '4\' 11"'$  (149.9 cm) Weight: 99.3 kg BMI (Calculated): 43.04   Intake/Output last 2 shifts:  10/07 0701 - 10/08 0700 In: 647.2 [I.V.:647.2] Out: 500 [Drains:500]   Physical Exam:  Constitutional: alert, cooperative and no distress  Respiratory: breathing non-labored at rest  Cardiovascular: regular rate and sinus rhythm  Integumentary: Midline wound open, peritoneum closed; granulating;  there are three stomatized areas visible in the LUQ and LLQ portions of the wound, output remains feculent. There is hypergranulation tissue to the inferior wound edge.  The main area of bleeding coming from the upper portion of the wound, inferior to the top stoma.  2-0 vicryl sutures were used but kept oozing from the raw surfaces of the granulation tissue.  Silver nitrate also tried but did not work.  Finally stopped using bovie cautery to the whole wound bed.  Surgicell applied as well as a precaution. Musculoskeletal: Trace to 1+ edema to bilateral LE     Labs:     Latest Ref Rng & Units 05/29/2022    7:17 PM 05/29/2022    4:52 AM 05/26/2022    5:09 AM  CBC  WBC 4.0 - 10.5 K/uL  3.1  3.2   Hemoglobin 12.0 - 15.0 g/dL 9.3  6.8  8.3   Hematocrit 36.0 - 46.0 % 30.3  22.2  27.3   Platelets 150 - 400 K/uL  70  90       Latest Ref Rng & Units 06/12/2022     5:46 AM 06/09/2022    4:52 AM 06/05/2022    4:58 AM  CMP  Glucose 70 - 99 mg/dL 159  156  162   BUN 6 - 20 mg/dL '17  18  20   '$ Creatinine 0.44 - 1.00 mg/dL 0.48  0.41  0.49   Sodium 135 - 145 mmol/L 135  134  134   Potassium 3.5 - 5.1 mmol/L 3.9  4.2  3.9   Chloride 98 - 111 mmol/L 103  104  106   CO2 22 - 32 mmol/L '25  23  24   '$ Calcium 8.9 - 10.3 mg/dL 8.5  8.0  8.5   Total Protein 6.5 - 8.1 g/dL 7.2  6.2  6.9   Total Bilirubin 0.3 - 1.2 mg/dL 0.5  0.5  0.5   Alkaline Phos 38 - 126 U/L 118  103  117   AST 15 - 41 U/L '30  27  25   '$ ALT 0 - 44 U/L '18  16  17     '$ Imaging studies: No new pertinent imaging studies   Assessment/Plan:  56 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   Patient had bleeding today again, requiring bovie cauterization at bedside.  No complications.  Continue ambulation, diet, TPN.  I spent 35 minutes dedicated to the care of this patient on the date of this encounter to include pre-visit review of records, face-to-face time with the patient discussing diagnosis and management, and any post-visit coordination of care.  Olean Ree, MD  [facility]

## 2022-06-16 LAB — GLUCOSE, CAPILLARY
Glucose-Capillary: 174 mg/dL — ABNORMAL HIGH (ref 70–99)
Glucose-Capillary: 123 mg/dL — ABNORMAL HIGH (ref 70–99)
Glucose-Capillary: 96 mg/dL (ref 70–99)
Glucose-Capillary: 124 mg/dL — ABNORMAL HIGH (ref 70–99)

## 2022-06-16 LAB — CBC
HCT: 21.8 % — ABNORMAL LOW (ref 36.0–46.0)
Hemoglobin: 6.8 g/dL — ABNORMAL LOW (ref 12.0–15.0)
MCH: 26 pg (ref 26.0–34.0)
MCHC: 31.2 g/dL (ref 30.0–36.0)
MCV: 83.2 fL (ref 80.0–100.0)
Platelets: 65 10*3/uL — ABNORMAL LOW (ref 150–400)
RBC: 2.62 MIL/uL — ABNORMAL LOW (ref 3.87–5.11)
RDW: 22.8 % — ABNORMAL HIGH (ref 11.5–15.5)
WBC: 2.5 10*3/uL — ABNORMAL LOW (ref 4.0–10.5)
nRBC: 0 % (ref 0.0–0.2)

## 2022-06-16 LAB — COMPREHENSIVE METABOLIC PANEL
ALT: 16 U/L (ref 0–44)
AST: 26 U/L (ref 15–41)
Albumin: 2.6 g/dL — ABNORMAL LOW (ref 3.5–5.0)
Alkaline Phosphatase: 110 U/L (ref 38–126)
Anion gap: 3 — ABNORMAL LOW (ref 5–15)
BUN: 20 mg/dL (ref 6–20)
CO2: 26 mmol/L (ref 22–32)
Calcium: 8 mg/dL — ABNORMAL LOW (ref 8.9–10.3)
Chloride: 106 mmol/L (ref 98–111)
Creatinine, Ser: 0.38 mg/dL — ABNORMAL LOW (ref 0.44–1.00)
GFR, Estimated: >60 mL/min (ref >60)
Glucose, Bld: 214 mg/dL — ABNORMAL HIGH (ref 70–99)
Potassium: 4 mmol/L (ref 3.5–5.1)
Sodium: 135 mmol/L (ref 135–145)
Total Bilirubin: 0.5 mg/dL (ref 0.3–1.2)
Total Protein: 5.9 g/dL — ABNORMAL LOW (ref 6.5–8.1)

## 2022-06-16 LAB — PREPARE RBC (CROSSMATCH)

## 2022-06-16 LAB — MISC LABCORP TEST (SEND OUT)
Labcorp test code: 70034
Labcorp test code: 71522
Labcorp test code: 724195

## 2022-06-16 LAB — MAGNESIUM: Magnesium: 1.9 mg/dL (ref 1.7–2.4)

## 2022-06-16 LAB — TRIGLYCERIDES: Triglycerides: 103 mg/dL (ref <150)

## 2022-06-16 LAB — PHOSPHORUS: Phosphorus: 2.7 mg/dL (ref 2.5–4.6)

## 2022-06-16 MED ORDER — ZINC CHLORIDE 1 MG/ML IV SOLN
INTRAVENOUS | Status: AC
  Filled 2022-06-16: qty 681.33

## 2022-06-16 MED ORDER — SODIUM CHLORIDE 0.9% IV SOLUTION: Freq: Once | INTRAVENOUS | Status: AC

## 2022-06-16 NOTE — TOC Progression Note (Signed)
Transition of Care [facility]) - Progression Note    Patient Details  Name: Debra [name] MRN: [id number] Date of Birth: 08-24-1966  [facility] CM/SW Contact  Beverly Sessions, RN Phone Number: 06/16/2022, 5:23 PM  Clinical Narrative:     Message below sent to MD via secure chat to inquire about disposition  "I discussed case again with my leadership and home will not be an option for [name] to arrange with the TPN, eakins pouches, and need for suction  I wanted to see if there was any new update on [facility] or if another tertiary care center would be an option "  Expected Discharge Plan: [place] Barriers to Discharge: Continued Medical Work up  Expected Discharge Plan and Services Expected Discharge Plan: [place]                                               Social Determinants of Health (SDOH) Interventions    Readmission Risk Interventions     No data to display

## 2022-06-16 NOTE — Consult Note (Signed)
[facility] Nurse wound follow up Current [name] pouch is intact with good seal over abd full thickness wound with fistulas; mod amt brown liquid, no wall suction attached. Nurses performing intermittent suction by inserting yankour periodically.  No further bleeding. Supplies at bedside and instructions have been provided if they need to change the pouch PRN if leaking. Thank-you,  Julien Girt MSN, [place], [place], [place], [facility]

## 2022-06-16 NOTE — Progress Notes (Signed)
PHARMACY - TOTAL PARENTERAL NUTRITION CONSULT NOTE   Indication: Prolonged ileus  Patient Measurements: Height: '4\' 11"'$  (149.9 cm) Weight: 99.3 kg (218 lb 14.7 oz) IBW/kg (Calculated) : 43.2 TPN AdjBW (KG): 55.2 Body mass index is 44.22 kg/m.  Assessment: Debra [name] is a 56 y.o. female s/p laparotomy, excision of greater omental mass, abdominal wall reconstruction with [name], appendectomy, and placement of Prevena vac.  Glucose / Insulin: No apparent history of diabetes. Blood glucoses have been reasonably controlled on current insulin regimen (SSI 4x/day + 12u insulin in TPN) Electrolytes: Magnesium persistently at LLN Renal: Scr stable (0.3-0.6) Hepatic: No transaminitis. LFTs within normal limits. TG within normal limits GI Imaging: 9/11 CTAP: no new acute issues GI Surgeries / Procedures: s/p laparotomy, excision of greater omental mass, abdominal wall reconstruction with [name], appendectomy, and placement of Prevena vac  Central access: 02/15/22 TPN start date: 02/15/22  Nutritional Goals: Goal cyclic TPN over 14 hrs: cyclic TPN over 14 hrs: (provides 91 g of protein and 1622 kcals per day) total volume over 14hrs for calculations=1400 ml(1500 ml total with overfill)  RD Assessment:  Estimated Needs Total Energy Estimated Needs: 1800-2100kcal/day Total Protein Estimated Needs: 90-110g/d Total Fluid Estimated Needs: 1.4-1.6L/day  Current Nutrition:  Soft diet + nutritional supplements, not meeting PO needs yet   Plan: Transitioned to *Cyclic* TPN on 7/62. Run over 18 hrs for 1st day, tolerated well, now changed to run over 14 hours  per MD.  Day 12 (10/6):  Start rate at 57 mL/hr for 1 hour.   Increase rate to 114 mL/hr for 11 hours.   Decrease rate to 57 mL/hr for 1 hour.  Decrease rate to 29 mL/hr for 1 hour, then stop.    Nutritional Components Amino acids (using 15% Clinisol): 102 g Dextrose 19% = 266 g Lipids (using 20%  SMOFlipids): 50.4 g kCal: 1817/24h  Electrolytes in TPN (standard): Na 61mq/L, K 537m/L, Ca 37m37mL, Mg 10 mEq/L (max recommended), Phos 10 mmol/L. Cl:Ac 1:1 Enteral multivitamin. Added thiamine 100 mg back to TPN as B1 level deficient. Continue trace elements and chromium per discussion with RD. Continue Zinc 10 mg Continue CBG/SSI for Cyclic TPN: SSI  -CBG 2 hrs after cyclic TPN start -CBG during middle of cyclic TPN infusion -CBG 1 hr after cylic TPN stopped -CBG while off TPN (=4 CBGs per 24 hr period) + 12u insulin in TPN Check TPN labs on Mon/Thurs at minimum Monitor for further need of diuresis and adjust as needed for fluid status  Thank you for allowing pharmacy to be a part of this patient's care.  CarGlean SalvoharmD Clinical Pharmacist  06/16/2022 11:03 AM

## 2022-06-16 NOTE — Progress Notes (Signed)
[facility] SURGICAL PROGRESS NOTE (cpt [id number])  Hospital Day(s): 123.   Post op day(s): 121 Days Post-Op.   Interval History:  Patient seen and examined Again with bleeding from inferior aspect of wound bed on 10/08; now resolved; note reviewed  [name] Pouch output 200 ccs + unmeasured in last 24 hours CBC added on to labs Nutritional labs are reassuring She is on soft diet; on cyclic TPN Ambulating independently   Review of Systems:  Constitutional: denies fever, chills  HEENT: denies cough or congestion  Respiratory: denies any shortness of breath  Cardiovascular: denies chest pain or palpitations  Gastrointestinal: denies abdominal pain, N/V Genitourinary: denies burning with urination or urinary frequency Integumentary: + midline wound  Vital signs in last 24 hours: [min-max] current  Temp:  [97.7 F (36.5 C)-98.9 F (37.2 C)] 98.4 F (36.9 C) (10/09 0722) Pulse Rate:  [76-91] 85 (10/09 0722) Resp:  [18-20] 18 (10/09 0722) BP: (92-113)/(46-60) 110/46 (10/09 0722) SpO2:  [98 %-99 %] 98 % (10/09 0722)     Height: '4\' 11"'$  (149.9 cm) Weight: 99.3 kg BMI (Calculated): 43.04   Intake/Output last 2 shifts:  10/08 0701 - 10/09 0700 In: 2022.5 [P.O.:840; I.V.:1182.5] Out: 900 [Urine:500; Drains:200]   Physical Exam:  Constitutional: alert, cooperative and no distress  Respiratory: breathing non-labored at rest  Cardiovascular: regular rate and sinus rhythm  Gastrointestinal: Soft, abdominal soreness on the right, non-distended, no rebound/guarding. Integumentary: Midline wound open, peritoneum closed; granulating;  there are three stomatized areas visible in the LUQ and LLQ portions of the wound, output remains feculent. There is hypergranulation tissue to the inferior wound edge. No further bleeding this morning (10/09); output thin stool   Labs:     Latest Ref Rng & Units 05/29/2022    7:17 PM 05/29/2022    4:52 AM 05/26/2022    5:09 AM  CBC  WBC 4.0 -  10.5 K/uL  3.1  3.2   Hemoglobin 12.0 - 15.0 g/dL 9.3  6.8  8.3   Hematocrit 36.0 - 46.0 % 30.3  22.2  27.3   Platelets 150 - 400 K/uL  70  90       Latest Ref Rng & Units 06/16/2022    6:00 AM 06/12/2022    5:46 AM 06/09/2022    4:52 AM  CMP  Glucose 70 - 99 mg/dL 214  159  156   BUN 6 - 20 mg/dL '20  17  18   '$ Creatinine 0.44 - 1.00 mg/dL 0.38  0.48  0.41   Sodium 135 - 145 mmol/L 135  135  134   Potassium 3.5 - 5.1 mmol/L 4.0  3.9  4.2   Chloride 98 - 111 mmol/L 106  103  104   CO2 22 - 32 mmol/L '26  25  23   '$ Calcium 8.9 - 10.3 mg/dL 8.0  8.5  8.0   Total Protein 6.5 - 8.1 g/dL 5.9  7.2  6.2   Total Bilirubin 0.3 - 1.2 mg/dL 0.5  0.5  0.5   Alkaline Phos 38 - 126 U/L 110  118  103   AST 15 - 41 U/L '26  30  27   '$ ALT 0 - 44 U/L '16  18  16     '$ Imaging studies: No new pertinent imaging studies   Assessment/Plan:  56 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   - New 10/09: Bleeding stopped this AM; appreciate efforts over the weekend. Continue to monitor; CBC added to morning labs.    - Wound Care: [name] pouch changed 10/05 secondary to bleeding from wound; now controlled. Back to [name] pouch with "window" but not on intermittent or continuous suction. Patient seems to like this better. She will need intermittent suction with Yankauer to control effluent. Likely be beneficial to suction before ambulation. Changed 10/05   - On Soft diet + nutritional supplementation - Continue TPN; transition to QHS - Appreciate dietary assistance with trace elements; iron supplementation            - Monitor abdominal examination; on-going bowel function            - Pain control prn; antemetic prn            - Out of bed; discharged  from therapies; no recommendations at this time   - Discharge Planning: Anticipate lengthy admission and potential eventual transfer; no new progress    All of the above findings and recommendations were discussed with the patient, and the medical team, and all of patient's questions were answered to her expressed satisfaction.  -- [name], PA-C [facility] 06/16/2022, 7:45 AM M-F: 7am - 4pm

## 2022-06-17 LAB — CBC
HCT: 25.6 % — ABNORMAL LOW (ref 36.0–46.0)
Hemoglobin: 8.1 g/dL — ABNORMAL LOW (ref 12.0–15.0)
MCH: 26.6 pg (ref 26.0–34.0)
MCHC: 31.6 g/dL (ref 30.0–36.0)
MCV: 83.9 fL (ref 80.0–100.0)
Platelets: 69 10*3/uL — ABNORMAL LOW (ref 150–400)
RBC: 3.05 MIL/uL — ABNORMAL LOW (ref 3.87–5.11)
RDW: 21.3 % — ABNORMAL HIGH (ref 11.5–15.5)
WBC: 2.7 10*3/uL — ABNORMAL LOW (ref 4.0–10.5)
nRBC: 0 % (ref 0.0–0.2)

## 2022-06-17 LAB — TYPE AND SCREEN
ABO/RH(D): O POS
Antibody Screen: NEGATIVE
Unit division: 0

## 2022-06-17 LAB — BPAM RBC
Blood Product Expiration Date: 202311122359
ISSUE DATE / TIME: 202310091653
Unit Type and Rh: 5100

## 2022-06-17 LAB — GLUCOSE, CAPILLARY
Glucose-Capillary: 179 mg/dL — ABNORMAL HIGH (ref 70–99)
Glucose-Capillary: 141 mg/dL — ABNORMAL HIGH (ref 70–99)
Glucose-Capillary: 109 mg/dL — ABNORMAL HIGH (ref 70–99)
Glucose-Capillary: 141 mg/dL — ABNORMAL HIGH (ref 70–99)

## 2022-06-17 LAB — ZINC: Zinc: 90 ug/dL (ref 44–115)

## 2022-06-17 LAB — COPPER, SERUM: Copper: 97 ug/dL (ref 80–158)

## 2022-06-17 MED ORDER — ZINC CHLORIDE 1 MG/ML IV SOLN
INTRAVENOUS | Status: AC
  Filled 2022-06-17: qty 681.33

## 2022-06-17 NOTE — Progress Notes (Signed)
CC: EC fistula Subjective: Transfused yesterday w Appropriate response in hemoglobin. Some scant clots this am that have subsided Objective: Vital signs in last 24 hours: Temp:  [97.9 F (36.6 C)-99.2 F (37.3 C)] 98.1 F (36.7 C) (10/10 0755) Pulse Rate:  [72-84] 83 (10/10 0755) Resp:  [16-20] 18 (10/10 0755) BP: (91-116)/(43-67) 93/55 (10/10 0755) SpO2:  [96 %-100 %] 97 % (10/10 0755) Weight:  [100.5 kg] 100.5 kg (10/10 0315) Last BM Date : 06/17/22  Intake/Output from previous day: 10/09 0701 - 10/10 0700 In: 1448.2 [I.V.:1014.2; Blood:434] Out: 1050 [Urine:450; Drains:600] Intake/Output this shift: Total I/O In: 0  Out: 50 [Drains:50]  Physical exam: NAD Abd Ec fistula w eakin pouch, no clots or active bleeding , small leak on RLQ  Lab Results: CBC  Recent Labs    06/16/22 0833 06/17/22 0503  WBC 2.5* 2.7*  HGB 6.8* 8.1*  HCT 21.8* 25.6*  PLT 65* 69*   BMET Recent Labs    06/16/22 0600  NA 135  K 4.0  CL 106  CO2 26  GLUCOSE 214*  BUN 20  CREATININE 0.38*  CALCIUM 8.0*   PT/INR Recent Labs    06/15/22 0930  LABPROT 16.4*  INR 1.3*   ABG No results for input(s): "PHART", "HCO3" in the last 72 hours.  Invalid input(s): "PCO2", "PO2"  Studies/Results: No results found.  Anti-infectives: Anti-infectives (From admission, onward)    Start     Dose/Rate Route Frequency Ordered Stop   03/04/22 2200  piperacillin-tazobactam (ZOSYN) IVPB 3.375 g        3.375 g 12.5 mL/hr over 240 Minutes Intravenous Every 8 hours 03/04/22 2005 03/18/22 0248   02/21/22 1500  vancomycin (VANCOREADY) IVPB 1250 mg/250 mL  Status:  Discontinued        1,250 mg 166.7 mL/hr over 90 Minutes Intravenous Every 24 hours 02/21/22 1111 02/24/22 1336   02/21/22 1400  meropenem (MERREM) 1 g in sodium chloride 0.9 % 100 mL IVPB        1 g 200 mL/hr over 30 Minutes Intravenous Every 8 hours 02/21/22 1258 02/27/22 2157   02/20/22 1500  vancomycin (VANCOCIN) IVPB 1000 mg/200  mL premix  Status:  Discontinued        1,000 mg 200 mL/hr over 60 Minutes Intravenous Every 24 hours 02/19/22 1706 02/21/22 1111   02/19/22 1115  vancomycin (VANCOREADY) IVPB 2000 mg/400 mL        2,000 mg 200 mL/hr over 120 Minutes Intravenous  Once 02/19/22 1025 02/19/22 1732   02/19/22 1115  fluconazole (DIFLUCAN) IVPB 400 mg        400 mg 100 mL/hr over 120 Minutes Intravenous Every 24 hours 02/19/22 1025 03/04/22 1301   02/15/22 0730  piperacillin-tazobactam (ZOSYN) IVPB 3.375 g  Status:  Discontinued        3.375 g 12.5 mL/hr over 240 Minutes Intravenous Every 8 hours 02/15/22 0639 02/21/22 1235   02/15/22 0655  piperacillin-tazobactam (ZOSYN) 3.375 GM/50ML IVPB       Note to Pharmacy: Glenford Peers N: cabinet override      02/15/22 0655 02/15/22 0816   02/13/22 2200  cefoTEtan (CEFOTAN) 2 g in sodium chloride 0.9 % 100 mL IVPB  Status:  Discontinued        2 g 200 mL/hr over 30 Minutes Intravenous Every 12 hours 02/13/22 1541 02/15/22 0639   02/13/22 1345  cefoTEtan (CEFOTAN) 2 g in sodium chloride 0.9 % 100 mL IVPB  2 g 200 mL/hr over 30 Minutes Intravenous  Once 02/13/22 1331 02/13/22 2146   02/13/22 1336  sodium chloride 0.9 % with cefoTEtan (CEFOTAN) ADS Med       Note to Pharmacy: Rulon Sera: cabinet override      02/13/22 1336 02/14/22 0144   02/13/22 0620  sodium chloride 0.9 % with cefoTEtan (CEFOTAN) ADS Med       Note to Pharmacy: Norton Blizzard A: cabinet override      02/13/22 0620 02/13/22 0802   02/13/22 0600  cefoTEtan (CEFOTAN) 2 g in sodium chloride 0.9 % 100 mL IVPB        2 g 200 mL/hr over 30 Minutes Intravenous On call to O.R. 02/12/22 2229 02/13/22 2146       Assessment/Plan:  EC fistula RN to change [name] pouch I will be available Hold lovenox given recent bleeding   Caroleen Hamman, MD, FACS  06/17/2022

## 2022-06-17 NOTE — Progress Notes (Signed)
PHARMACY - TOTAL PARENTERAL NUTRITION CONSULT NOTE   Indication: Prolonged ileus  Patient Measurements: Height: '4\' 11"'$  (149.9 cm) Weight: 100.5 kg (221 lb 9 oz) IBW/kg (Calculated) : 43.2 TPN AdjBW (KG): 55.2 Body mass index is 44.75 kg/m.  Assessment: Debra [name] is a 56 y.o. female s/p laparotomy, excision of greater omental mass, abdominal wall reconstruction with [name], appendectomy, and placement of Prevena vac.  Glucose / Insulin: No apparent history of diabetes. Blood glucoses have been reasonably controlled on current insulin regimen (SSI 4x/day + 12u insulin in TPN) Electrolytes: Magnesium persistently at LLN Renal: Scr stable (0.3-0.6) Hepatic: No transaminitis. LFTs within normal limits. TG within normal limits GI Imaging: 9/11 CTAP: no new acute issues GI Surgeries / Procedures: s/p laparotomy, excision of greater omental mass, abdominal wall reconstruction with [name], appendectomy, and placement of Prevena vac  Central access: 02/15/22 TPN start date: 02/15/22  Nutritional Goals: Goal cyclic TPN over 14 hrs: cyclic TPN over 14 hrs: (provides 91 g of protein and 1622 kcals per day) total volume over 14hrs for calculations=1400 ml(1500 ml total with overfill)  RD Assessment:  Estimated Needs Total Energy Estimated Needs: 1800-2100kcal/day Total Protein Estimated Needs: 90-110g/d Total Fluid Estimated Needs: 1.4-1.6L/day  Current Nutrition:  Soft diet + nutritional supplements, not meeting PO needs yet   Plan: Transitioned to *Cyclic* TPN on 7/07. Run over 18 hrs for 1st day, tolerated well, now changed to run over 14 hours  per MD.  Day 12 (10/6):  Start rate at 57 mL/hr for 1 hour.   Increase rate to 114 mL/hr for 11 hours.   Decrease rate to 57 mL/hr for 1 hour.  Decrease rate to 29 mL/hr for 1 hour, then stop.    Nutritional Components Amino acids (using 15% Clinisol): 102 g Dextrose 19% = 266 g Lipids (using 20%  SMOFlipids): 50.4 g kCal: 1817/24h  Electrolytes in TPN (standard): Na 33mq/L, K 529m/L, Ca 69m108mL, Mg 10 mEq/L (max recommended), Phos 10 mmol/L. Cl:Ac 1:1 Enteral multivitamin. Added thiamine 100 mg back to TPN as B1 level deficient. Continue trace elements and chromium per discussion with RD. Continue Zinc 10 mg Continue CBG/SSI for Cyclic TPN: SSI  -CBG 2 hrs after cyclic TPN start -CBG during middle of cyclic TPN infusion -CBG 1 hr after cylic TPN stopped -CBG while off TPN (=4 CBGs per 24 hr period) + 12u insulin in TPN Check TPN labs on Mon/Thurs at minimum Monitor for further need of diuresis and adjust as needed for fluid status  Thank you for allowing pharmacy to be a part of this patient's care.  WilDara HoyerharmD PGY-1 Pharmacy Resident 06/17/2022 10:12 AM

## 2022-06-17 NOTE — Plan of Care (Addendum)
Pt alert and oriented x 4. Up ad lib needs some standby assistance with pole. TPN infusing. Vitals stable. Pt received blood 10/9. Emptied canister this am from abdomen eakin noted to have small clots. Dr. Dahlia Byes sent msg this am and RN [facility] aware.  Problem: Clinical Measurements: Goal: Will remain free from infection Outcome: Progressing   Problem: Nutrition: Goal: Adequate nutrition will be maintained Outcome: Progressing   Problem: Activity: Goal: Risk for activity intolerance will decrease Outcome: Progressing   Problem: Nutrition: Goal: Adequate nutrition will be maintained Outcome: Progressing   Problem: Coping: Goal: Level of anxiety will decrease Outcome: Progressing   Problem: Pain Managment: Goal: General experience of comfort will improve Outcome: Progressing

## 2022-06-17 NOTE — Progress Notes (Signed)
Nutrition Follow-up  DOCUMENTATION CODES:   Obesity unspecified  INTERVENTION:   Continue cyclic TPN per pharmacy- provides 1817kcal/day and 102g/day protein   Discontinue additional zinc supplementation in TPN   Ensure Enlive po TID, each supplement provides 350 kcal and 20 grams of protein.  Discontinue MVI po daily and add MVI daily to TPN  Resume trace elements daily in TPN  Daily weights   Vitamin D3- 2000 units po daily   Vitamin A 10,000 units po daily   Thiamine 168m daily in TPN   Check vitamins on 10/16: ferritin, folate, iron, TIBC, transferrin, A, B1, B6, D, E, K1  NUTRITION DIAGNOSIS:   Increased nutrient needs related to wound healing, catabolic illness as evidenced by estimated needs.  GOAL:   Patient will meet greater than or equal to 90% of their needs -met with TPN   MONITOR:   PO intake, Supplement acceptance, Labs, Weight trends, Diet advancement, I & O's, TPN  ASSESSMENT:   56y/o female with h/o hypothyroidism, COVID 19 (3/21), kidney stones and stage 3 colon cancer (s/p left hemicolectomy 5/21 and chemotherapy) who is admitted for new pelvic mass now s/p laparotomy 6/8 (with excision of pelvic mass from greater omentum, abdominal wall reconstruction with bilateral myocutaneous flaps and mesh, incisional hernia repair, appendectomy repair and VAC placement) complicated by bowel perforation s/p reopening of recent laparotomy 6/10 (with repair of small bowel perforation, excision of mesh, placement of two phasix mesh and VAC placement). Pathology returned as metastatic adenocarcinoma.   Pt continues to tolerate cyclic TPN at goal rate. Refeed labs stable. Triglycerides wnl. Hyperglycemia resolved. Insulin remains in TPN. Trace elements and MVI added back to TPN. Zinc normalized; will discontinue additional zinc from TPN. Selenium returned low; trace elements added back to TPN. Manganese improved but remains elevated; RD will monitor. MVI added back to  TPN r/t concerns for malabsorption. Will recheck vitamin labs 10/16. Pt remains on a soft diet; pt eating <25% of meals. Pt is drinking some Ensure. Per chart, pt is up ~24lbs since admission. [name] pouch remains in place with 2559moutput documented.    Medications reviewed and include: D3, lovenox, insulin, L-glutamine, synthroid, imodium, MVI, protonix, carafate, vitamin A, zinc (in TPN), thiamine (in TPN)  -Selenium 81(L), Chromium 1.4 wnl, Manganese 26.3(H), Iodine 57.9 wnl, copper 97 wnl, zinc 90 wnl- 10/2 -Vitamin B1- 59.6(L), Vitamin D 24.10(L), vitamin A 18.5(L), B12 256 wnl, B6 13.3 wnl, vitamin E 11.7 wnl, vitamin K 0.32 wnl- 9/14  Labs reviewed: Na 135 wnl, K 4.0 wnl, creat 0.38(L), P 2.7 wnl, Mg 1.9 wnl Triglycerides- 103 Ferritin- 11.0 wnl, folate 13.2 wnl- 9/14 Hgb 8.1(L), Hct 25.6(L) Cbgs-  141, 179 x 24 hrs  Diet Order:    Diet Order             DIET SOFT Room service appropriate? Yes; Fluid consistency: Thin  Diet effective now                  EDUCATION NEEDS:   Not appropriate for education at this time  Skin:  Skin Assessment: Reviewed RN Assessment ( Midline wound: 15cm x 10cm x 6cm )  Last BM:  10/10- type 6  Height:   Ht Readings from Last 1 Encounters:  03/01/22 '4\' 11"'  (1.499 m)    Weight:   Wt Readings from Last 1 Encounters:  06/17/22 100.5 kg    Ideal Body Weight:  44.3 kg  BMI:  Body mass index is 44.75 kg/m.  Estimated Nutritional Needs:   Kcal:  1800-2100kcal/day  Protein:  90-110g/d  Fluid:  1.4-1.6L/day  Koleen Distance MS, RD, LDN Please refer to [facility] for RD and/or RD on-call/weekend/after hours pager

## 2022-06-17 NOTE — Plan of Care (Signed)
  Problem: Nutrition: Goal: Adequate nutrition will be maintained Outcome: Progressing   Problem: Activity: Goal: Risk for activity intolerance will decrease Outcome: Progressing   Problem: Coping: Goal: Level of anxiety will decrease Outcome: Progressing   Problem: Pain Managment: Goal: General experience of comfort will improve Outcome: Progressing   

## 2022-06-18 LAB — GLUCOSE, CAPILLARY
Glucose-Capillary: 198 mg/dL — ABNORMAL HIGH (ref 70–99)
Glucose-Capillary: 127 mg/dL — ABNORMAL HIGH (ref 70–99)
Glucose-Capillary: 129 mg/dL — ABNORMAL HIGH (ref 70–99)
Glucose-Capillary: 107 mg/dL — ABNORMAL HIGH (ref 70–99)

## 2022-06-18 MED ORDER — TRACE MINERALS CU-MN-SE-ZN 300-55-60-3000 MCG/ML IV SOLN
INTRAVENOUS | Status: AC
  Filled 2022-06-18: qty 681.33

## 2022-06-18 NOTE — Progress Notes (Signed)
[facility] SURGICAL PROGRESS NOTE (cpt [phone])  Hospital Day(s): 125.   Post op day(s): 123 Days Post-Op.   Interval History:  Patient seen and examined [name] changed yesterday (10/10) [name] Pouch output 200 ccs + unmeasured in last 24 hours No new labs today; twice weekly labs planned for tomorrow  She is on soft diet; on cyclic TPN Ambulating independently   Review of Systems:  Constitutional: denies fever, chills  HEENT: denies cough or congestion  Respiratory: denies any shortness of breath  Cardiovascular: denies chest pain or palpitations  Gastrointestinal: denies abdominal pain, N/V Genitourinary: denies burning with urination or urinary frequency Integumentary: + midline wound  Vital signs in last 24 hours: [min-max] current  Temp:  [98 F (36.7 C)-98.5 F (36.9 C)] 98.5 F (36.9 C) (10/11 0855) Pulse Rate:  [80-84] 84 (10/11 0855) Resp:  [16-18] 16 (10/11 0855) BP: (95-110)/(52-59) 95/52 (10/11 0855) SpO2:  [97 %-100 %] 97 % (10/11 0855) Weight:  [98.8 kg] 98.8 kg (10/11 0431)     Height: '4\' 11"'$  (149.9 cm) Weight: 98.8 kg BMI (Calculated): 43.04   Intake/Output last 2 shifts:  10/10 0701 - 10/11 0700 In: 480 [P.O.:480] Out: 700 [Drains:700]   Physical Exam:  Constitutional: alert, cooperative and no distress  Respiratory: breathing non-labored at rest  Cardiovascular: regular rate and sinus rhythm  Gastrointestinal: Soft, abdominal soreness on the right, non-distended, no rebound/guarding. Integumentary: Midline wound open, peritoneum closed; granulating;  there are three stomatized areas visible in the LUQ and LLQ portions of the wound, output remains feculent. There is hypergranulation tissue to the inferior wound edge. No further bleeding this morning (10/11); output thin stool   Labs:     Latest Ref Rng & Units 06/17/2022    5:03 AM 06/16/2022    8:33 AM 05/29/2022    7:17 PM  CBC  WBC 4.0 - 10.5 K/uL 2.7  2.5    Hemoglobin 12.0 -  15.0 g/dL 8.1  6.8  9.3   Hematocrit 36.0 - 46.0 % 25.6  21.8  30.3   Platelets 150 - 400 K/uL 69  65        Latest Ref Rng & Units 06/16/2022    6:00 AM 06/12/2022    5:46 AM 06/09/2022    4:52 AM  CMP  Glucose 70 - 99 mg/dL 214  159  156   BUN 6 - 20 mg/dL '20  17  18   '$ Creatinine 0.44 - 1.00 mg/dL 0.38  0.48  0.41   Sodium 135 - 145 mmol/L 135  135  134   Potassium 3.5 - 5.1 mmol/L 4.0  3.9  4.2   Chloride 98 - 111 mmol/L 106  103  104   CO2 22 - 32 mmol/L '26  25  23   '$ Calcium 8.9 - 10.3 mg/dL 8.0  8.5  8.0   Total Protein 6.5 - 8.1 g/dL 5.9  7.2  6.2   Total Bilirubin 0.3 - 1.2 mg/dL 0.5  0.5  0.5   Alkaline Phos 38 - 126 U/L 110  118  103   AST 15 - 41 U/L '26  30  27   '$ ALT 0 - 44 U/L '16  18  16     '$ Imaging studies: No new pertinent imaging studies   Assessment/Plan:  56 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   - New 10/11: No issues   - Wound Care: [name] pouch changed 10/10 secondary to to leak. Back to [name] pouch with "window" but not on intermittent or continuous suction. Patient seems to like this better. She will need intermittent suction with Yankauer to control effluent. Likely be beneficial to suction before ambulation. Changed 10/10   - On Soft diet + nutritional supplementation - Continue TPN; transition to QHS - Appreciate dietary assistance with trace elements; iron supplementation            - Monitor abdominal examination; on-going bowel function            - Pain control prn; antemetic prn            - Out of bed; discharged from therapies; no recommendations at this time   - Discharge Planning: Anticipate lengthy admission and potential eventual transfer; no new progress    All of the  above findings and recommendations were discussed with the patient, and the medical team, and all of patient's questions were answered to her expressed satisfaction.  -- [name], PA-C [facility] 06/18/2022, 9:59 AM M-F: 7am - 4pm

## 2022-06-18 NOTE — Progress Notes (Signed)
PHARMACY - TOTAL PARENTERAL NUTRITION CONSULT NOTE   Indication: Prolonged ileus  Patient Measurements: Height: '4\' 11"'$  (149.9 cm) Weight: 98.8 kg (217 lb 13 oz) IBW/kg (Calculated) : 43.2 TPN AdjBW (KG): 55.2 Body mass index is 43.99 kg/m.  Assessment: Debra [name] is a 56 y.o. female s/p laparotomy, excision of greater omental mass, abdominal wall reconstruction with [name], appendectomy, and placement of Prevena vac.  Glucose / Insulin: No apparent history of diabetes. Blood glucoses have been reasonably controlled on current insulin regimen (SSI 4x/day + 12u insulin in TPN) Electrolytes: Magnesium persistently at LLN Renal: Scr stable (0.3-0.6) Hepatic: No transaminitis. LFTs within normal limits. TG within normal limits GI Imaging: 9/11 CTAP: no new acute issues GI Surgeries / Procedures: s/p laparotomy, excision of greater omental mass, abdominal wall reconstruction with [name], appendectomy, and placement of Prevena vac  Central access: 02/15/22 TPN start date: 02/15/22  Nutritional Goals: Goal cyclic TPN over 14 hrs: cyclic TPN over 14 hrs: (provides 91 g of protein and 1622 kcals per day) total volume over 14hrs for calculations=1400 ml(1500 ml total with overfill)  RD Assessment:  Estimated Needs Total Energy Estimated Needs: 1800-2100kcal/day Total Protein Estimated Needs: 90-110g/d Total Fluid Estimated Needs: 1.4-1.6L/day  Current Nutrition:  Soft diet + nutritional supplements, not meeting PO needs yet   Plan: Transitioned to *Cyclic* TPN on 6/01. Run over 18 hrs for 1st day, tolerated well, now changed to run over 14 hours  per MD.  Day 12 (10/6):  Start rate at 57 mL/hr for 1 hour.   Increase rate to 114 mL/hr for 11 hours.   Decrease rate to 57 mL/hr for 1 hour.  Decrease rate to 29 mL/hr for 1 hour, then stop.    Nutritional Components Amino acids (using 15% Clinisol): 102 g Dextrose 19% = 266 g Lipids (using 20%  SMOFlipids): 50.4 g kCal: 1817/24h  Electrolytes in TPN (standard): Changed Na from 55 to 49mq/L, K 530m/L, Ca 29m72mL, Mg 10 mEq/L (max recommended), Phos 10 mmol/L. Cl:Ac 1:1 Enteral multivitamin. Added thiamine 100 mg back to TPN as B1 level deficient. Continue trace elements and chromium per discussion with RD. Discontinued Zinc 10 mg Continue CBG/SSI for Cyclic TPN: SSI  -CBG 2 hrs after cyclic TPN start -CBG during middle of cyclic TPN infusion -CBG 1 hr after cylic TPN stopped -CBG while off TPN (=4 CBGs per 24 hr period) + 12u insulin in TPN Check TPN labs on Mon/Thurs at minimum Monitor for further need of diuresis and adjust as needed for fluid status  Thank you for allowing pharmacy to be a part of this patient's care.  WilDara HoyerharmD PGY-1 Pharmacy Resident 06/18/2022 11:16 AM

## 2022-06-18 NOTE — Progress Notes (Signed)
Mobility Specialist - Progress Note   06/18/22 1446  Mobility  Activity Ambulated independently in hallway  Level of Assistance Independent  Assistive Device None  Distance Ambulated (ft) 1000 ft  Activity Response Tolerated well  Mobility Referral Yes  $Mobility charge 1 Mobility   [place]ie [place] Mobility Specialist 06/18/22 2:54 PM

## 2022-06-18 NOTE — Consult Note (Addendum)
[facility] Nurse wound follow up Pouch was leaking yesterday and was changed by the bedside nurse, according to progress notes. Current [name] pouch with window is intact with good seal over abd full thickness wound with fistulas; mod amt brown liquid, no wall suction attached. Nurses performing intermittent suction by inserting yankour periodically.  No further bleeding. Supplies at bedside and instructions have been provided if they need to change the pouch PRN if leaking. Thank-you,  Julien Girt MSN, [place], [place], [place], [place]

## 2022-06-19 LAB — GLUCOSE, CAPILLARY
Glucose-Capillary: 171 mg/dL — ABNORMAL HIGH (ref 70–99)
Glucose-Capillary: 108 mg/dL — ABNORMAL HIGH (ref 70–99)
Glucose-Capillary: 121 mg/dL — ABNORMAL HIGH (ref 70–99)
Glucose-Capillary: 113 mg/dL — ABNORMAL HIGH (ref 70–99)

## 2022-06-19 LAB — COMPREHENSIVE METABOLIC PANEL
ALT: 18 U/L (ref 0–44)
AST: 30 U/L (ref 15–41)
Albumin: 2.7 g/dL — ABNORMAL LOW (ref 3.5–5.0)
Alkaline Phosphatase: 106 U/L (ref 38–126)
Anion gap: 3 — ABNORMAL LOW (ref 5–15)
BUN: 19 mg/dL (ref 6–20)
CO2: 26 mmol/L (ref 22–32)
Calcium: 8.2 mg/dL — ABNORMAL LOW (ref 8.9–10.3)
Chloride: 107 mmol/L (ref 98–111)
Creatinine, Ser: 0.5 mg/dL (ref 0.44–1.00)
GFR, Estimated: >60 mL/min (ref >60)
Glucose, Bld: 168 mg/dL — ABNORMAL HIGH (ref 70–99)
Potassium: 3.8 mmol/L (ref 3.5–5.1)
Sodium: 136 mmol/L (ref 135–145)
Total Bilirubin: 0.6 mg/dL (ref 0.3–1.2)
Total Protein: 6.3 g/dL — ABNORMAL LOW (ref 6.5–8.1)

## 2022-06-19 LAB — TRIGLYCERIDES: Triglycerides: 116 mg/dL (ref <150)

## 2022-06-19 LAB — PHOSPHORUS: Phosphorus: 3.5 mg/dL (ref 2.5–4.6)

## 2022-06-19 LAB — MAGNESIUM: Magnesium: 1.9 mg/dL (ref 1.7–2.4)

## 2022-06-19 MED ORDER — TRACE MINERALS CU-MN-SE-ZN 300-55-60-3000 MCG/ML IV SOLN
INTRAVENOUS | Status: AC
  Filled 2022-06-19: qty 681.33

## 2022-06-19 NOTE — Consult Note (Signed)
[place] Nurse wound follow up Patient receiving care in [facility]. The [name] pouch with window intact, no signs of impending leakage.  225 ml of golden brown feculent effluent suctioned from pouch. Pouch not connected to wall suction.  Supplies and pattern in room. Val Riles, RN, MSN, CWOCN, CNS-BC, pager [phone]

## 2022-06-19 NOTE — Progress Notes (Signed)
PHARMACY - TOTAL PARENTERAL NUTRITION CONSULT NOTE   Indication: Prolonged ileus  Patient Measurements: Height: '4\' 11"'$  (149.9 cm) Weight: 99.8 kg (220 lb 0.3 oz) IBW/kg (Calculated) : 43.2 TPN AdjBW (KG): 55.2 Body mass index is 44.44 kg/m.  Assessment: Debra [name] is a 57 y.o. female s/p laparotomy, excision of greater omental mass, abdominal wall reconstruction with [name], appendectomy, and placement of Prevena vac.  Glucose / Insulin: No apparent history of diabetes. Blood glucoses have been reasonably controlled on current insulin regimen (SSI 4x/day + 12u insulin in TPN) Electrolytes: Magnesium persistently at LLN Renal: Scr stable (0.3-0.6) Hepatic: No transaminitis. LFTs within normal limits. TG within normal limits GI Imaging: 9/11 CTAP: no new acute issues GI Surgeries / Procedures: s/p laparotomy, excision of greater omental mass, abdominal wall reconstruction with [name], appendectomy, and placement of Prevena vac  Central access: 02/15/22 TPN start date: 02/15/22  Nutritional Goals: Goal cyclic TPN over 14 hrs: cyclic TPN over 14 hrs: (provides 91 g of protein and 1622 kcals per day) total volume over 14hrs for calculations=1400 ml(1500 ml total with overfill)  RD Assessment:  Estimated Needs Total Energy Estimated Needs: 1800-2100kcal/day Total Protein Estimated Needs: 90-110g/d Total Fluid Estimated Needs: 1.4-1.6L/day  Current Nutrition:  Soft diet + nutritional supplements, not meeting PO needs yet   Plan: Transitioned to *Cyclic* TPN on 6/38. Run over 18 hrs for 1st day, tolerated well, now changed to run over 14 hours  per MD.  Day 12 (10/6):  Start rate at 57 mL/hr for 1 hour.   Increase rate to 114 mL/hr for 11 hours.   Decrease rate to 57 mL/hr for 1 hour.  Decrease rate to 29 mL/hr for 1 hour, then stop.    Nutritional Components Amino acids (using 15% Clinisol): 102 g Dextrose 19% = 266 g Lipids (using 20%  SMOFlipids): 50.4 g kCal: 1817/24h  Electrolytes in TPN (standard): Na 80mq/L, K 516m/L, Ca 58m67mL, Mg 10 mEq/L (max recommended), Phos 10 mmol/L. Cl:Ac 1:1 Adding MVI back to TPN. Added thiamine 100 mg back to TPN as B1 level deficient. Continue trace elements and chromium per discussion with RD. Discontinued Zinc 10 mg. Continue CBG/SSI for Cyclic TPN: SSI  -CBG 2 hrs after cyclic TPN start -CBG during middle of cyclic TPN infusion -CBG 1 hr after cylic TPN stopped -CBG while off TPN (=4 CBGs per 24 hr period) + 12u insulin in TPN Check TPN labs on Mon/Thurs at minimum Monitor for further need of diuresis and adjust as needed for fluid status  Thank you for allowing pharmacy to be a part of this patient's care.  WilDara HoyerharmD PGY-1 Pharmacy Resident 06/19/2022 9:56 AM

## 2022-06-19 NOTE — Progress Notes (Signed)
[facility] (cpt [phone])  Hospital Day(s): 126.   Post op day(s): 124 Days Post-Op.   Interval History:  Patient seen and examined [name] changed 10/10 [name] Pouch output 1050 ccs + unmeasured in last 24 hours Nutritional labs are reassuring  She is on soft diet; on cyclic TPN Ambulating independently   Review of Systems:  Constitutional: denies fever, chills  HEENT: denies cough or congestion  Respiratory: denies any shortness of breath  Cardiovascular: denies chest pain or palpitations  Gastrointestinal: denies abdominal pain, N/V Genitourinary: denies burning with urination or urinary frequency Integumentary: + midline wound  Vital signs in last 24 hours: [min-max] current  Temp:  [98.1 F (36.7 C)-98.4 F (36.9 C)] 98.1 F (36.7 C) (10/12 0816) Pulse Rate:  [77-84] 78 (10/12 0816) Resp:  [12-18] 12 (10/12 0816) BP: (99-107)/(54-64) 103/54 (10/12 0816) SpO2:  [96 %-100 %] 96 % (10/12 0816) Weight:  [99.8 kg] 99.8 kg (10/12 0500)     Height: '4\' 11"'$  (149.9 cm) Weight: 99.8 kg BMI (Calculated): 43.04   Intake/Output last 2 shifts:  10/11 0701 - 10/12 0700 In: 1039.9 [P.O.:240; I.V.:799.9] Out: 1050 [Drains:1050]   Physical Exam:  Constitutional: alert, cooperative and no distress  Respiratory: breathing non-labored at rest  Cardiovascular: regular rate and sinus rhythm  Gastrointestinal: Soft, abdominal soreness on the right, non-distended, no rebound/guarding. Integumentary: Midline wound open, peritoneum closed; granulating;  there are three stomatized areas visible in the LUQ and LLQ portions of the wound, output remains feculent. There is hypergranulation tissue to the inferior wound edge.   Labs:     Latest Ref Rng & Units 06/17/2022    5:03 AM 06/16/2022    8:33 AM 05/29/2022    7:17 PM  CBC  WBC 4.0 - 10.5 K/uL 2.7  2.5    Hemoglobin 12.0 - 15.0 g/dL 8.1  6.8  9.3   Hematocrit 36.0 - 46.0 % 25.6  21.8  30.3    Platelets 150 - 400 K/uL 69  65        Latest Ref Rng & Units 06/19/2022    5:08 AM 06/16/2022    6:00 AM 06/12/2022    5:46 AM  CMP  Glucose 70 - 99 mg/dL 168  214  159   BUN 6 - 20 mg/dL '19  20  17   '$ Creatinine 0.44 - 1.00 mg/dL 0.50  0.38  0.48   Sodium 135 - 145 mmol/L 136  135  135   Potassium 3.5 - 5.1 mmol/L 3.8  4.0  3.9   Chloride 98 - 111 mmol/L 107  106  103   CO2 22 - 32 mmol/L '26  26  25   '$ Calcium 8.9 - 10.3 mg/dL 8.2  8.0  8.5   Total Protein 6.5 - 8.1 g/dL 6.3  5.9  7.2   Total Bilirubin 0.3 - 1.2 mg/dL 0.6  0.5  0.5   Alkaline Phos 38 - 126 U/L 106  110  118   AST 15 - 41 U/L '30  26  30   '$ ALT 0 - 44 U/L '18  16  18     '$ Imaging studies: No new pertinent imaging studies   Assessment/Plan:  56 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   - New  10/12: No issues   - Wound Care: [name] pouch changed 10/10 secondary to to leak. Back to [name] pouch with "window" but not on intermittent or continuous suction. Patient seems to like this better. She will need intermittent suction with Yankauer to control effluent. Likely be beneficial to suction before ambulation. Changed 10/10   - On Soft diet + nutritional supplementation - Continue TPN; transition to QHS - Appreciate dietary assistance with trace elements; iron supplementation            - Monitor abdominal examination; on-going bowel function            - Pain control prn; antemetic prn            - Out of bed; discharged from therapies; no recommendations at this time   - Discharge Planning: Anticipate lengthy admission and potential eventual transfer; no new progress    All of the above findings and recommendations were discussed with the patient, and  the medical team, and all of patient's questions were answered to her expressed satisfaction.  -- Debra Simon, PA-C [facility] 06/19/2022, 8:58 AM M-F: 7am - 4pm

## 2022-06-19 NOTE — Plan of Care (Signed)
  Problem: Nutrition: Goal: Adequate nutrition will be maintained 06/19/2022 0503 by Henrine Screws, RN Outcome: Progressing  Problem: Pain Managment: Goal: General experience of comfort will improve Outcome: Progressing   Problem: Coping: Goal: Level of anxiety will decrease 06/19/2022 0503 by Henrine Screws, RN Outcome: Progressing

## 2022-06-19 NOTE — Progress Notes (Signed)
IVT consulted to change port dressing.  Spoke with Primary RN, Barth Kirks who was advised needle change is q7 days. SWAT RN, [place] to reaccess port and change drsg.

## 2022-06-20 ENCOUNTER — Inpatient Hospital Stay: Payer: Medicaid Other

## 2022-06-20 LAB — GLUCOSE, CAPILLARY
Glucose-Capillary: 146 mg/dL — ABNORMAL HIGH (ref 70–99)
Glucose-Capillary: 187 mg/dL — ABNORMAL HIGH (ref 70–99)
Glucose-Capillary: 176 mg/dL — ABNORMAL HIGH (ref 70–99)
Glucose-Capillary: 100 mg/dL — ABNORMAL HIGH (ref 70–99)
Glucose-Capillary: 97 mg/dL (ref 70–99)

## 2022-06-20 MED ORDER — FUROSEMIDE 20 MG PO TABS
20.0000 mg | ORAL_TABLET | Freq: Two times a day (BID) | ORAL | Status: DC
  Administered 2022-06-20 – 2022-07-22 (×62): 20 mg via ORAL
  Filled 2022-06-20 (×63): qty 1

## 2022-06-20 MED ORDER — TRACE MINERALS CU-MN-SE-ZN 300-55-60-3000 MCG/ML IV SOLN
INTRAVENOUS | Status: AC
  Filled 2022-06-20: qty 681.33

## 2022-06-20 NOTE — Plan of Care (Signed)
  Problem: Clinical Measurements: Goal: Will remain free from infection Outcome: Progressing   Problem: Nutrition: Goal: Adequate nutrition will be maintained Outcome: Progressing   Problem: Activity: Goal: Risk for activity intolerance will decrease Outcome: Progressing   Problem: Nutrition: Goal: Adequate nutrition will be maintained Outcome: Progressing   Problem: Coping: Goal: Level of anxiety will decrease Outcome: Progressing   Problem: Pain Managment: Goal: General experience of comfort will improve Outcome: Progressing   

## 2022-06-20 NOTE — Progress Notes (Signed)
[facility] PROGRESS NOTE (cpt [phone])  Hospital Day(s): 127.   Post op day(s): 125 Days Post-Op.   Interval History:  Patient seen and examined [name] changed 10/10 [name] Pouch output 225 ccs + unmeasured in last 24 hours No new labs She is on soft diet; on cyclic TPN Ambulating independently   Review of Systems:  Constitutional: denies fever, chills  HEENT: denies cough or congestion  Respiratory: denies any shortness of breath  Cardiovascular: denies chest pain or palpitations  Gastrointestinal: denies abdominal pain, N/V Genitourinary: denies burning with urination or urinary frequency Integumentary: + midline wound  Vital signs in last 24 hours: [min-max] current  Temp:  [97.7 F (36.5 C)-98.3 F (36.8 C)] 98.3 F (36.8 C) (10/13 0836) Pulse Rate:  [79-95] 95 (10/13 0836) Resp:  [14-20] 18 (10/13 0836) BP: (96-115)/(51-70) 108/70 (10/13 0836) SpO2:  [99 %-100 %] 99 % (10/13 0836)     Height: '4\' 11"'$  (149.9 cm) Weight: 99.8 kg BMI (Calculated): 43.04   Intake/Output last 2 shifts:  10/12 0701 - 10/13 0700 In: -  Out: 1325 [Drains:300; Stool:225]   Physical Exam:  Constitutional: alert, cooperative and no distress  Respiratory: breathing non-labored at rest  Cardiovascular: regular rate and sinus rhythm  Gastrointestinal: Soft, abdominal soreness on the right, non-distended, no rebound/guarding. Integumentary: Midline wound open, peritoneum closed; granulating;  there are three stomatized areas visible in the LUQ and LLQ portions of the wound, output remains feculent. There is hypergranulation tissue to the inferior wound edge.   Labs:     Latest Ref Rng & Units 06/17/2022    5:03 AM 06/16/2022    8:33 AM 05/29/2022    7:17 PM  CBC  WBC 4.0 - 10.5 K/uL 2.7  2.5    Hemoglobin 12.0 - 15.0 g/dL 8.1  6.8  9.3   Hematocrit 36.0 - 46.0 % 25.6  21.8  30.3   Platelets 150 - 400 K/uL 69  65        Latest Ref Rng & Units 06/19/2022    5:08 AM  06/16/2022    6:00 AM 06/12/2022    5:46 AM  CMP  Glucose 70 - 99 mg/dL 168  214  159   BUN 6 - 20 mg/dL '19  20  17   '$ Creatinine 0.44 - 1.00 mg/dL 0.50  0.38  0.48   Sodium 135 - 145 mmol/L 136  135  135   Potassium 3.5 - 5.1 mmol/L 3.8  4.0  3.9   Chloride 98 - 111 mmol/L 107  106  103   CO2 22 - 32 mmol/L '26  26  25   '$ Calcium 8.9 - 10.3 mg/dL 8.2  8.0  8.5   Total Protein 6.5 - 8.1 g/dL 6.3  5.9  7.2   Total Bilirubin 0.3 - 1.2 mg/dL 0.6  0.5  0.5   Alkaline Phos 38 - 126 U/L 106  110  118   AST 15 - 41 U/L '30  26  30   '$ ALT 0 - 44 U/L '18  16  18     '$ Imaging studies: No new pertinent imaging studies   Assessment/Plan:  56 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   - New 10/13: No issues; will get BLE Venous US for DVT   -  Wound Care: [name] pouch changed 10/10 secondary to to leak. Back to [name] pouch with "window" but not on intermittent or continuous suction. Patient seems to like this better. She will need intermittent suction with Yankauer to control effluent. Likely be beneficial to suction before ambulation. Changed 10/10   - On Soft diet + nutritional supplementation - Continue TPN; transition to QHS - Appreciate dietary assistance with trace elements; iron supplementation            - Monitor abdominal examination; on-going bowel function            - Pain control prn; antemetic prn            - Out of bed; discharged from therapies; no recommendations at this time   - Discharge Planning: Anticipate lengthy admission and potential eventual transfer; no new progress    All of the above findings and recommendations were discussed with the patient, and the medical team, and all of patient's questions were  answered to her expressed satisfaction.  -- [name], PA-C  [facility] 06/20/2022, 9:17 AM M-F: 7am - 4pm

## 2022-06-20 NOTE — Progress Notes (Addendum)
PHARMACY - TOTAL PARENTERAL NUTRITION CONSULT NOTE   Indication: Prolonged ileus  Patient Measurements: Height: '4\' 11"'$  (149.9 cm) Weight: 99.8 kg (220 lb 0.3 oz) IBW/kg (Calculated) : 43.2 TPN AdjBW (KG): 55.2 Body mass index is 44.44 kg/m.  Assessment: Debra [name] is a 56 y.o. female s/p laparotomy, excision of greater omental mass, abdominal wall reconstruction with [name], appendectomy, and placement of Prevena vac.  Glucose / Insulin: No apparent history of diabetes. Blood glucoses have been reasonably controlled on current insulin regimen (SSI 4x/day + 12u insulin in TPN) Electrolytes: Magnesium persistently at LLN Renal: Scr stable (0.3-0.6) Hepatic: No transaminitis. LFTs within normal limits. TG within normal limits GI Imaging: 9/11 CTAP: no new acute issues GI Surgeries / Procedures: s/p laparotomy, excision of greater omental mass, abdominal wall reconstruction with [name], appendectomy, and placement of Prevena vac  Central access: 02/15/22 TPN start date: 02/15/22  Nutritional Goals: Goal cyclic TPN over 16 hrs: cyclic TPN over 16 hrs: (provides 91 g of protein and 1622 kcals per day) total volume over 16hrs for calculations=1400 ml(1500 ml total with overfill)  RD Assessment:  Estimated Needs Total Energy Estimated Needs: 1800-2100kcal/day Total Protein Estimated Needs: 90-110g/d Total Fluid Estimated Needs: 1.4-1.6L/day  Current Nutrition:  Soft diet + nutritional supplements, not meeting PO needs yet   Plan: Transitioned to *Cyclic* TPN on 0/93. Run over 18 hrs for 1st day, tolerated well, then changed to run over 14 hrs, and now changed to run over 16 hrs per MD as of 10/13.  Nutritional Components Amino acids (using 15% Clinisol): 102 g Dextrose 19% = 266 g Lipids (using 20% SMOFlipids): 50.4 g kCal: 1817/24h  Electrolytes in TPN (standard): Na 757mq/L, K 562m/L, Ca 57m80mL, Mg 10 mEq/L (max recommended), Phos 10  mmol/L. Cl:Ac 1:1 MVI back in but per RD, remove MVI for TPN orders placed on 10/14 and 10/15. Added thiamine 100 mg back to TPN as B1 level deficient. Continue trace elements and chromium per discussion with RD. Discontinued Zinc 10 mg. Continue CBG/SSI for Cyclic TPN: SSI  -CBG 2 hrs after cyclic TPN start -CBG during middle of cyclic TPN infusion -CBG 1 hr after cylic TPN stopped -CBG while off TPN (=4 CBGs per 24 hr period) + 12u insulin in TPN Check TPN labs on Mon/Thurs at minimum Monitor for further need of diuresis and adjust as needed for fluid status Lasix 20 mg PO BID added 10/13 Will check BMP and Mg on 10/14  Thank you for allowing pharmacy to be a part of this patient's care.  WilDara HoyerharmD PGY-1 Pharmacy Resident 06/20/2022 11:08 AM

## 2022-06-21 LAB — GLUCOSE, CAPILLARY
Glucose-Capillary: 168 mg/dL — ABNORMAL HIGH (ref 70–99)
Glucose-Capillary: 164 mg/dL — ABNORMAL HIGH (ref 70–99)
Glucose-Capillary: 160 mg/dL — ABNORMAL HIGH (ref 70–99)
Glucose-Capillary: 89 mg/dL (ref 70–99)
Glucose-Capillary: 136 mg/dL — ABNORMAL HIGH (ref 70–99)

## 2022-06-21 MED ORDER — TRACE MINERALS CU-MN-SE-ZN 300-55-60-3000 MCG/ML IV SOLN
INTRAVENOUS | Status: AC
  Filled 2022-06-21: qty 681.33

## 2022-06-21 MED ORDER — TRACE MINERALS CU-MN-SE-ZN 300-55-60-3000 MCG/ML IV SOLN
INTRAVENOUS | Status: DC
  Filled 2022-06-21: qty 681.33

## 2022-06-21 NOTE — Progress Notes (Signed)
Subjective:  CC: Debra [name] is a 56 y.o. female  Hospital stay day 128, 126 Days Post-Op  s/p re-opening of laparotomy for repair of small bowel perforation following initial laparotomy, excision of greater omental mass, abdominal wall reconstruction with [name], appendectomy, and placement of Prevena vac on 06/08.  HPI: No acute issues overnight.  She thinks there is still some bleeding within the bag.  ROS:  General: Denies weight loss, weight gain, fatigue, fevers, chills, and night sweats. Heart: Denies chest pain, palpitations, racing heart, irregular heartbeat, leg pain or swelling, and decreased activity tolerance. Respiratory: Denies breathing difficulty, shortness of breath, wheezing, cough, and sputum. GI: Denies change in appetite, heartburn, nausea, vomiting, constipation, diarrhea, and blood in stool. GU: Denies difficulty urinating, pain with urinating, urgency, frequency, blood in urine.   Objective:   Temp:  [97.9 F (36.6 C)-98.7 F (37.1 C)] 98.6 F (37 C) (10/14 0829) Pulse Rate:  [77-82] 80 (10/14 0833) Resp:  [16-18] 16 (10/14 0829) BP: (89-107)/(47-55) 92/51 (10/14 0833) SpO2:  [95 %-100 %] 97 % (10/14 0833)     Height: '4\' 11"'$  (149.9 cm) Weight: 99.8 kg BMI (Calculated): 43.04   Intake/Output this shift:   Intake/Output Summary (Last 24 hours) at 06/21/2022 1218 Last data filed at 06/20/2022 1600 Gross per 24 hour  Intake --  Output 300 ml  Net -300 ml    Constitutional :  alert, cooperative, appears stated age, and no distress  Respiratory:  clear to auscultation bilaterally  Cardiovascular:  regular rate and rhythm  Gastrointestinal: soft, non-tender; bowel sounds normal; no masses,  no organomegaly.   Skin: Cool and moist.  Midline wound with [name]'s pouch filled with feculent output with some old serosanguineous output.  Visualization of the fistula surface does not know any signs of active bleeding.  Psychiatric: Normal  affect, non-agitated, not confused       LABS:     Latest Ref Rng & Units 06/19/2022    5:08 AM 06/16/2022    6:00 AM 06/12/2022    5:46 AM  CMP  Glucose 70 - 99 mg/dL 168  214  159   BUN 6 - 20 mg/dL '19  20  17   '$ Creatinine 0.44 - 1.00 mg/dL 0.50  0.38  0.48   Sodium 135 - 145 mmol/L 136  135  135   Potassium 3.5 - 5.1 mmol/L 3.8  4.0  3.9   Chloride 98 - 111 mmol/L 107  106  103   CO2 22 - 32 mmol/L '26  26  25   '$ Calcium 8.9 - 10.3 mg/dL 8.2  8.0  8.5   Total Protein 6.5 - 8.1 g/dL 6.3  5.9  7.2   Total Bilirubin 0.3 - 1.2 mg/dL 0.6  0.5  0.5   Alkaline Phos 38 - 126 U/L 106  110  118   AST 15 - 41 U/L '30  26  30   '$ ALT 0 - 44 U/L '18  16  18       '$ Latest Ref Rng & Units 06/17/2022    5:03 AM 06/16/2022    8:33 AM 05/29/2022    7:17 PM  CBC  WBC 4.0 - 10.5 K/uL 2.7  2.5    Hemoglobin 12.0 - 15.0 g/dL 8.1  6.8  9.3   Hematocrit 36.0 - 46.0 % 25.6  21.8  30.3   Platelets 150 - 400 K/uL 69  65      RADS: N/a Assessment:  s/p re-opening of laparotomy for repair of small bowel perforation following initial laparotomy, excision of greater omental mass, abdominal wall reconstruction with [name], appendectomy, and placement of Prevena vac on 06/08.  No signs of active bleeding at this time.  Expect some bloody output as the area continues to heal from recent interventions.  We will continue to monitor for now.  labs/images/medications/previous chart entries reviewed personally and relevant changes/updates noted above.

## 2022-06-21 NOTE — Progress Notes (Addendum)
PHARMACY - TOTAL PARENTERAL NUTRITION CONSULT NOTE   Indication: Prolonged ileus  Patient Measurements: Height: '4\' 11"'$  (149.9 cm) Weight: 99.8 kg (220 lb 0.3 oz) IBW/kg (Calculated) : 43.2 TPN AdjBW (KG): 55.2 Body mass index is 44.44 kg/m.  Assessment: Debra [name] is a 56 y.o. female s/p laparotomy, excision of greater omental mass, abdominal wall reconstruction with [name], appendectomy, and placement of Prevena vac.  Glucose / Insulin: No apparent history of diabetes. Blood glucoses have been reasonably controlled on current insulin regimen (SSI 4x/day + 12u insulin in TPN):   SSI: 6 units /24 hrs Electrolytes: Magnesium persistently at LLN Renal: Scr stable (0.3-0.6) Hepatic: No transaminitis. LFTs within normal limits. TG within normal limits GI Imaging: 9/11 CTAP: no new acute issues GI Surgeries / Procedures: s/p laparotomy, excision of greater omental mass, abdominal wall reconstruction with [name], appendectomy, and placement of Prevena vac  Central access: 02/15/22 TPN start date: 02/15/22  Nutritional Goals: Goal cyclic TPN over 16 hrs: cyclic TPN over 16 hrs: (provides 91 g of protein and 1622 kcals per day) total volume over 16hrs for calculations=1400 ml(1500 ml total with overfill)  RD Assessment:  Estimated Needs Total Energy Estimated Needs: 1800-2100kcal/day Total Protein Estimated Needs: 90-110g/d Total Fluid Estimated Needs: 1.4-1.6L/day  Current Nutrition:  Soft diet + nutritional supplements, not meeting PO needs yet   Plan: Transitioned to *Cyclic* TPN on 3/43.  Changing to run over 16 hours per MD request as of 10/13. Hang at 2000 per discussion with Dietician to allow patient time for walking in afternoon/evenings.  10/14: To run over 16 hours: -Start rate at 49 mL/hr for 1 hours. -Increase rate to 98 mL/hr for 13 hours.  -Decrease rate to 49 mL/hr for 1 hours. -Decrease rate to 25 mL/hr for 1 hours, then  stop.  Nutritional Components Amino acids (using 15% Clinisol): 102 g Dextrose 19% = 266 g Lipids (using 20% SMOFlipids): 50.4 g kCal: 1817/24h  Electrolytes in TPN (standard): Na 3mq/L, K 517m/L, Ca 12m64mL, Mg 10 mEq/L (max recommended), Phos 10 mmol/L. Cl:Ac 1:1 MVI back in but per RD, remove MVI for TPN orders placed on 10/14 and 10/15. Added thiamine 100 mg back to TPN as B1 level deficient. Continue trace elements and chromium per discussion with RD. Discontinued Zinc 10 mg. Continue CBG/SSI for Cyclic TPN: SSI  -CBG 2 hrs after cyclic TPN start -CBG during middle of cyclic TPN infusion -CBG 1 hr after cylic TPN stopped -CBG while off TPN (=4 CBGs per 24 hr period) + 12u insulin in TPN Check TPN labs on Mon/Thurs at minimum Monitor for further need of diuresis and adjust as needed for fluid status Lasix 20 mg PO BID added 10/13 Will check BMP and Mg on 10/15  Thank you for allowing pharmacy to be a part of this patient's care.  [name] Clinical Pharmacist 06/21/2022

## 2022-06-22 LAB — GLUCOSE, CAPILLARY
Glucose-Capillary: 181 mg/dL — ABNORMAL HIGH (ref 70–99)
Glucose-Capillary: 166 mg/dL — ABNORMAL HIGH (ref 70–99)
Glucose-Capillary: 131 mg/dL — ABNORMAL HIGH (ref 70–99)
Glucose-Capillary: 128 mg/dL — ABNORMAL HIGH (ref 70–99)

## 2022-06-22 LAB — BASIC METABOLIC PANEL
Anion gap: 4 — ABNORMAL LOW (ref 5–15)
BUN: 20 mg/dL (ref 6–20)
CO2: 27 mmol/L (ref 22–32)
Calcium: 7.9 mg/dL — ABNORMAL LOW (ref 8.9–10.3)
Chloride: 104 mmol/L (ref 98–111)
Creatinine, Ser: 0.52 mg/dL (ref 0.44–1.00)
GFR, Estimated: >60 mL/min (ref >60)
Glucose, Bld: 163 mg/dL — ABNORMAL HIGH (ref 70–99)
Potassium: 3.6 mmol/L (ref 3.5–5.1)
Sodium: 135 mmol/L (ref 135–145)

## 2022-06-22 LAB — MAGNESIUM: Magnesium: 1.9 mg/dL (ref 1.7–2.4)

## 2022-06-22 MED ORDER — TRACE MINERALS CU-MN-SE-ZN 300-55-60-3000 MCG/ML IV SOLN
INTRAVENOUS | Status: AC
  Filled 2022-06-22: qty 681.33

## 2022-06-22 MED ORDER — MAGNESIUM SULFATE 2 GM/50ML IV SOLN
2.0000 g | Freq: Once | INTRAVENOUS | Status: AC
  Administered 2022-06-22: 2 g via INTRAVENOUS
  Filled 2022-06-22: qty 50

## 2022-06-22 NOTE — Progress Notes (Signed)
Subjective:  CC: Debra [name] is a 56 y.o. female  Hospital stay day 129, 127 Days Post-Op  s/p re-opening of laparotomy for repair of small bowel perforation following initial laparotomy, excision of greater omental mass, abdominal wall reconstruction with [name], appendectomy, and placement of Prevena vac on 06/08.  HPI: No acute issues overnight.  No pain.  Minimal blood in bag  ROS:  General: Denies weight loss, weight gain, fatigue, fevers, chills, and night sweats. Heart: Denies chest pain, palpitations, racing heart, irregular heartbeat, leg pain or swelling, and decreased activity tolerance. Respiratory: Denies breathing difficulty, shortness of breath, wheezing, cough, and sputum. GI: Denies change in appetite, heartburn, nausea, vomiting, constipation, diarrhea, and blood in stool. GU: Denies difficulty urinating, pain with urinating, urgency, frequency, blood in urine.   Objective:   Temp:  [98.1 F (36.7 C)-98.8 F (37.1 C)] 98.1 F (36.7 C) (10/15 1011) Pulse Rate:  [75-88] 75 (10/15 1011) Resp:  [16-20] 18 (10/15 1011) BP: (87-104)/(52-62) 87/52 (10/15 1011) SpO2:  [97 %-99 %] 99 % (10/15 1011)     Height: '4\' 11"'$  (149.9 cm) Weight: 99.8 kg BMI (Calculated): 43.04   Intake/Output this shift:   Intake/Output Summary (Last 24 hours) at 06/22/2022 1423 Last data filed at 06/22/2022 0647 Gross per 24 hour  Intake --  Output 975 ml  Net -975 ml    Constitutional :  alert, cooperative, appears stated age, and no distress  Respiratory:  clear to auscultation bilaterally  Cardiovascular:  regular rate and rhythm  Gastrointestinal: soft, non-tender; bowel sounds normal; no masses,  no organomegaly.   Skin: Cool and moist.  Midline wound with [name]'s pouch filled with feculent output with some serosanguinous output.  Visualization of the fistula surface does not know any signs of active bleeding.  Psychiatric: Normal affect, non-agitated, not  confused       LABS:     Latest Ref Rng & Units 06/22/2022    9:33 AM 06/19/2022    5:08 AM 06/16/2022    6:00 AM  CMP  Glucose 70 - 99 mg/dL 163  168  214   BUN 6 - 20 mg/dL '20  19  20   '$ Creatinine 0.44 - 1.00 mg/dL 0.52  0.50  0.38   Sodium 135 - 145 mmol/L 135  136  135   Potassium 3.5 - 5.1 mmol/L 3.6  3.8  4.0   Chloride 98 - 111 mmol/L 104  107  106   CO2 22 - 32 mmol/L '27  26  26   '$ Calcium 8.9 - 10.3 mg/dL 7.9  8.2  8.0   Total Protein 6.5 - 8.1 g/dL  6.3  5.9   Total Bilirubin 0.3 - 1.2 mg/dL  0.6  0.5   Alkaline Phos 38 - 126 U/L  106  110   AST 15 - 41 U/L  30  26   ALT 0 - 44 U/L  18  16       Latest Ref Rng & Units 06/17/2022    5:03 AM 06/16/2022    8:33 AM 05/29/2022    7:17 PM  CBC  WBC 4.0 - 10.5 K/uL 2.7  2.5    Hemoglobin 12.0 - 15.0 g/dL 8.1  6.8  9.3   Hematocrit 36.0 - 46.0 % 25.6  21.8  30.3   Platelets 150 - 400 K/uL 69  65      RADS: N/a Assessment:    s/p re-opening of laparotomy for repair of small bowel  perforation following initial laparotomy, excision of greater omental mass, abdominal wall reconstruction with [name], appendectomy, and placement of Prevena vac on 06/08.  No signs of active bleeding at this time.   We will continue to monitor for now.  labs/images/medications/previous chart entries reviewed personally and relevant changes/updates noted above.

## 2022-06-22 NOTE — Progress Notes (Signed)
Reached out to Pharmacy to find out the current rate of the replenishment TPN bag and per pharmacy, should be 98 ml/hr ([employer]). Infusion rate set to said rate.

## 2022-06-22 NOTE — Progress Notes (Signed)
PHARMACY - TOTAL PARENTERAL NUTRITION CONSULT NOTE   Indication: Prolonged ileus  Patient Measurements: Height: '4\' 11"'$  (149.9 cm) Weight: 99.8 kg (220 lb 0.3 oz) IBW/kg (Calculated) : 43.2 TPN AdjBW (KG): 55.2 Body mass index is 44.44 kg/m.  Assessment: Debra [name] is a 56 y.o. female s/p laparotomy, excision of greater omental mass, abdominal wall reconstruction with [name], appendectomy, and placement of Prevena vac.  Glucose / Insulin: No apparent history of diabetes. Blood glucoses have been reasonably controlled on current insulin regimen (SSI 4x/day + 12u insulin in TPN):    SSI: 8 units /24 hrs Electrolytes: Magnesium persistently at LLN Renal: Scr stable (0.3-0.6) Hepatic: No transaminitis. LFTs within normal limits. TG within normal limits GI Imaging: 9/11 CTAP: no new acute issues GI Surgeries / Procedures: s/p laparotomy, excision of greater omental mass, abdominal wall reconstruction with [name], appendectomy, and placement of Prevena vac  Central access: 02/15/22 TPN start date: 02/15/22  Nutritional Goals: Goal cyclic TPN over 16 hrs: cyclic TPN over 16 hrs: (provides 91 g of protein and 1622 kcals per day) total volume over 16hrs for calculations=1400 ml(1500 ml total with overfill)  RD Assessment:  Estimated Needs Total Energy Estimated Needs: 1800-2100kcal/day Total Protein Estimated Needs: 90-110g/d Total Fluid Estimated Needs: 1.4-1.6L/day  Current Nutrition:  Soft diet + nutritional supplements, not meeting PO needs yet   Plan: Transitioned to *Cyclic* TPN on 8/93.  81/01: Changing to run over 16 hours per MD request. [name] at 2000 per discussion with Dietician to allow patient time for walking in afternoon/evenings.  10/14: To run over 16 hours: -Start rate at 49 mL/hr for 1 hours. -Increase rate to 98 mL/hr for 13 hours.  -Decrease rate to 49 mL/hr for 1 hours. -Decrease rate to 25 mL/hr for 1 hours, then  stop.  Nutritional Components Amino acids (using 15% Clinisol): 102 g Dextrose 19% = 266 g Lipids (using 20% SMOFlipids): 50.4 g kCal: 1817/24h  Electrolytes in TPN (standard): Na 53mq/L, K 563m/L, Ca 20m67mL, Mg 10 mEq/L (max recommended), Phos 10 mmol/L. Cl:Ac 1:1 Mag 1.9  ordered Magnesium 2 gm IV x1  (Mag runs low borderline in this pt) Remove MVI for TPN orders placed on 10/14 and 10/15.  Removed thiamine on 10/15 per discussion w/ Dietician.  Continue trace elements and chromium per discussion with RD. Discontinued Zinc 10 mg. Continue CBG/SSI for Cyclic TPN: SSI  -CBG 2 hrs after cyclic TPN start -CBG during middle of cyclic TPN infusion -CBG 1 hr after cylic TPN stopped -CBG while off TPN (=4 CBGs per 24 hr period) + 12u insulin in TPN Check TPN labs on Mon/Thurs at minimum Monitor for further need of diuresis and adjust as needed for fluid status Lasix 20 mg PO BID added 10/13  Thank you for allowing pharmacy to be a part of this patient's care.  [name] Clinical Pharmacist 06/22/2022

## 2022-06-23 LAB — GLUCOSE, CAPILLARY
Glucose-Capillary: 144 mg/dL — ABNORMAL HIGH (ref 70–99)
Glucose-Capillary: 159 mg/dL — ABNORMAL HIGH (ref 70–99)
Glucose-Capillary: 161 mg/dL — ABNORMAL HIGH (ref 70–99)
Glucose-Capillary: 107 mg/dL — ABNORMAL HIGH (ref 70–99)
Glucose-Capillary: 101 mg/dL — ABNORMAL HIGH (ref 70–99)

## 2022-06-23 LAB — COMPREHENSIVE METABOLIC PANEL
ALT: 17 U/L (ref 0–44)
AST: 25 U/L (ref 15–41)
Albumin: 2.7 g/dL — ABNORMAL LOW (ref 3.5–5.0)
Alkaline Phosphatase: 103 U/L (ref 38–126)
Anion gap: 6 (ref 5–15)
BUN: 21 mg/dL — ABNORMAL HIGH (ref 6–20)
CO2: 29 mmol/L (ref 22–32)
Calcium: 7.9 mg/dL — ABNORMAL LOW (ref 8.9–10.3)
Chloride: 102 mmol/L (ref 98–111)
Creatinine, Ser: 0.44 mg/dL (ref 0.44–1.00)
GFR, Estimated: >60 mL/min (ref >60)
Glucose, Bld: 162 mg/dL — ABNORMAL HIGH (ref 70–99)
Potassium: 3.1 mmol/L — ABNORMAL LOW (ref 3.5–5.1)
Sodium: 137 mmol/L (ref 135–145)
Total Bilirubin: 0.5 mg/dL (ref 0.3–1.2)
Total Protein: 6.1 g/dL — ABNORMAL LOW (ref 6.5–8.1)

## 2022-06-23 LAB — PHOSPHORUS: Phosphorus: 3.7 mg/dL (ref 2.5–4.6)

## 2022-06-23 LAB — IRON AND TIBC
Iron: 31 ug/dL (ref 28–170)
Saturation Ratios: 9 % — ABNORMAL LOW (ref 10.4–31.8)
TIBC: 360 ug/dL (ref 250–450)
UIBC: 329 ug/dL

## 2022-06-23 LAB — VITAMIN B12: Vitamin B-12: 291 pg/mL (ref 180–914)

## 2022-06-23 LAB — FERRITIN: Ferritin: 33 ng/mL (ref 11–307)

## 2022-06-23 LAB — TRANSFERRIN: Transferrin: 272 mg/dL (ref 192–382)

## 2022-06-23 LAB — TRIGLYCERIDES: Triglycerides: 98 mg/dL (ref <150)

## 2022-06-23 LAB — VITAMIN D 25 HYDROXY (VIT D DEFICIENCY, FRACTURES): Vit D, 25-Hydroxy: 19.46 ng/mL — ABNORMAL LOW (ref 30–100)

## 2022-06-23 LAB — FOLATE: Folate: 10.5 ng/mL (ref >5.9)

## 2022-06-23 LAB — MAGNESIUM: Magnesium: 2 mg/dL (ref 1.7–2.4)

## 2022-06-23 MED ORDER — TRACE MINERALS CU-MN-SE-ZN 300-55-60-3000 MCG/ML IV SOLN
INTRAVENOUS | Status: AC
  Filled 2022-06-23: qty 681.33

## 2022-06-23 MED ORDER — POTASSIUM CHLORIDE CRYS ER 20 MEQ PO TBCR
20.0000 meq | EXTENDED_RELEASE_TABLET | Freq: Once | ORAL | Status: AC
  Administered 2022-06-23: 20 meq via ORAL
  Filled 2022-06-23: qty 1

## 2022-06-23 MED ORDER — POTASSIUM CHLORIDE 10 MEQ/100ML IV SOLN
10.0000 meq | INTRAVENOUS | Status: DC
  Administered 2022-06-23: 10 meq via INTRAVENOUS
  Filled 2022-06-23: qty 100

## 2022-06-23 MED ORDER — SODIUM CHLORIDE 0.9 % IV SOLN
200.0000 mg | INTRAVENOUS | Status: AC
  Administered 2022-06-24 – 2022-06-26 (×3): 200 mg via INTRAVENOUS
  Filled 2022-06-23 (×3): qty 200

## 2022-06-23 MED ORDER — VITAMIN D 25 MCG (1000 UNIT) PO TABS
3000.0000 [IU] | ORAL_TABLET | Freq: Every day | ORAL | Status: DC
  Administered 2022-06-24 – 2022-06-30 (×7): 3000 [IU] via ORAL
  Filled 2022-06-23 (×7): qty 3

## 2022-06-23 MED ORDER — POTASSIUM CHLORIDE 10 MEQ/100ML IV SOLN
10.0000 meq | INTRAVENOUS | Status: AC
  Administered 2022-06-23: 10 meq via INTRAVENOUS
  Filled 2022-06-23: qty 100

## 2022-06-23 NOTE — Progress Notes (Signed)
PHARMACY - TOTAL PARENTERAL NUTRITION CONSULT NOTE   Indication: Prolonged ileus  Patient Measurements: Height: '4\' 11"'$  (149.9 cm) Weight: 97.7 kg (215 lb 6.2 oz) IBW/kg (Calculated) : 43.2 TPN AdjBW (KG): 55.2 Body mass index is 43.5 kg/m.  Assessment: Debra [name] is a 56 y.o. female s/p laparotomy, excision of greater omental mass, abdominal wall reconstruction with [name], appendectomy, and placement of Prevena vac.  Glucose / Insulin: No apparent history of diabetes. BG <180 (SSI 4x/day + 12u insulin in TPN):    SSI: 9 units /24 hrs Electrolytes: Magnesium persistently at LLN Renal: Scr stable (0.3-0.6) Hepatic: No transaminitis. LFTs within normal limits. TG within normal limits GI Imaging: 9/11 CTAP: no new acute issues GI Surgeries / Procedures: s/p laparotomy, excision of greater omental mass, abdominal wall reconstruction with [name], appendectomy, and placement of Prevena vac  Central access: 02/15/22 TPN start date: 02/15/22  Nutritional Goals: Goal cyclic TPN over 16 hrs: cyclic TPN over 16 hrs: (provides 91 g of protein and 1622 kcals per day) total volume over 16hrs for calculations=1400 ml(1500 ml total with overfill)  RD Assessment:  Estimated Needs Total Energy Estimated Needs: 1800-2100kcal/day Total Protein Estimated Needs: 90-110g/d Total Fluid Estimated Needs: 1.4-1.6L/day  Current Nutrition:  Soft diet + nutritional supplements, not meeting PO needs yet   Plan: Transitioned to *Cyclic* TPN on 6/94.  85/46: Changing to run over 16 hours per MD request. [name] at 2000 per discussion with Dietician to allow patient time for walking in afternoon/evenings.  10/14: To run over 16 hours: -Start rate at 49 mL/hr for 1 hours. -Increase rate to 98 mL/hr for 13 hours.  -Decrease rate to 49 mL/hr for 1 hours. -Decrease rate to 25 mL/hr for 1 hours, then stop.  Nutritional Components Amino acids (using 15% Clinisol): 102  g Dextrose 19% = 266 g Lipids (using 20% SMOFlipids): 50.4 g kCal: 1817/24h  Electrolytes in TPN (standard): Na 46mq/L, K 530m/L, Ca 41m46mL, Mg 10 mEq/L (max recommended), Phos 10 mmol/L. Cl:Ac 1:1 K 3.1 Ordered Kcl 64m99mV x2 and Kcl 20me32m x1 Continue trace elements and chromium per discussion with RD. Discontinued Zinc 10 mg. Continue CBG/SSI for Cyclic TPN: SSI  -CBG 2 hrs after cyclic TPN start -CBG during middle of cyclic TPN infusion -CBG 1 hr after cylic TPN stopped -CBG while off TPN (=4 CBGs per 24 hr period) + 12u insulin in TPN Check TPN labs on Mon/Thurs at minimum Monitor for further need of diuresis and adjust as needed for fluid status Lasix 20 mg PO BID added 10/13  AnderDarrick Pennaical Pharmacist 06/23/2022 8:00 AM

## 2022-06-23 NOTE — Progress Notes (Signed)
Mobility Specialist - Progress Note   06/23/22 1616  Mobility  Activity Ambulated independently in hallway  Level of Assistance Independent  Assistive Device None  Distance Ambulated (ft) 400 ft  Activity Response Tolerated well  Mobility Referral Yes  $Mobility charge 1 Mobility   Candie Mile Mobility Specialist 06/23/22 4:17 PM

## 2022-06-23 NOTE — Progress Notes (Signed)
[facility] SURGICAL PROGRESS NOTE (cpt [phone])  Hospital Day(s): 130.   Post op day(s): 128 Days Post-Op.   Interval History:  Patient seen and examined [name] changed 10/10 [name] Pouch output 300 ccs + unmeasured in last 24 hours Mild hypokalemia to 3.1 Otherwise, nutritional labs are reasuring She is on soft diet; on cyclic TPN Ambulating independently   Review of Systems:  Constitutional: denies fever, chills  HEENT: denies cough or congestion  Respiratory: denies any shortness of breath  Cardiovascular: denies chest pain or palpitations  Gastrointestinal: denies abdominal pain, N/V Genitourinary: denies burning with urination or urinary frequency Integumentary: + midline wound  Vital signs in last 24 hours: [min-max] current  Temp:  [97.9 F (36.6 C)-98.1 F (36.7 C)] 98 F (36.7 C) (10/16 0433) Pulse Rate:  [73-78] 78 (10/16 0433) Resp:  [16-18] 16 (10/16 0433) BP: (85-100)/(51-60) 100/60 (10/16 0438) SpO2:  [93 %-100 %] 93 % (10/16 0433) Weight:  [97.7 kg] 97.7 kg (10/16 0440)     Height: '4\' 11"'$  (149.9 cm) Weight: 97.7 kg BMI (Calculated): 43.04   Intake/Output last 2 shifts:  10/15 0701 - 10/16 0700 In: 626.6 [I.V.:626.6] Out: 300 [Stool:300]   Physical Exam:  Constitutional: alert, cooperative and no distress  Respiratory: breathing non-labored at rest  Cardiovascular: regular rate and sinus rhythm  Gastrointestinal: Soft, abdominal soreness on the right, non-distended, no rebound/guarding. Integumentary: Midline wound open, peritoneum closed; granulating;  there are three stomatized areas visible in the LUQ and LLQ portions of the wound, output remains feculent. There is hypergranulation tissue to the inferior wound edge.   Labs:     Latest Ref Rng & Units 06/17/2022    5:03 AM 06/16/2022    8:33 AM 05/29/2022    7:17 PM  CBC  WBC 4.0 - 10.5 K/uL 2.7  2.5    Hemoglobin 12.0 - 15.0 g/dL 8.1  6.8  9.3   Hematocrit 36.0 - 46.0 % 25.6  21.8   30.3   Platelets 150 - 400 K/uL 69  65        Latest Ref Rng & Units 06/23/2022    5:26 AM 06/22/2022    9:33 AM 06/19/2022    5:08 AM  CMP  Glucose 70 - 99 mg/dL 162  163  168   BUN 6 - 20 mg/dL '21  20  19   '$ Creatinine 0.44 - 1.00 mg/dL 0.44  0.52  0.50   Sodium 135 - 145 mmol/L 137  135  136   Potassium 3.5 - 5.1 mmol/L 3.1  3.6  3.8   Chloride 98 - 111 mmol/L 102  104  107   CO2 22 - 32 mmol/L '29  27  26   '$ Calcium 8.9 - 10.3 mg/dL 7.9  7.9  8.2   Total Protein 6.5 - 8.1 g/dL 6.1   6.3   Total Bilirubin 0.3 - 1.2 mg/dL 0.5   0.6   Alkaline Phos 38 - 126 U/L 103   106   AST 15 - 41 U/L 25   30   ALT 0 - 44 U/L 17   18     Imaging studies: No new pertinent imaging studies   Assessment/Plan:  56 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   - New 10/16: No  issues   - Wound Care: [name] pouch changed 10/10 secondary to to leak. Back to [name] pouch with "window" but not on intermittent or continuous suction. Patient seems to like this better. She will need intermittent suction with Yankauer to control effluent. Likely be beneficial to suction before ambulation. Changed 10/10   - On Soft diet + nutritional supplementation - Continue TPN; transition to QHS - Appreciate dietary assistance with trace elements; iron supplementation            - Monitor abdominal examination; on-going bowel function            - Pain control prn; antemetic prn            - Out of bed; discharged from therapies; no recommendations at this time   - Discharge Planning: Anticipate lengthy admission and potential eventual transfer; no new progress    All of the above findings and recommendations were discussed with the patient, and the  medical team, and all of patient's questions were answered to her expressed satisfaction.  -- [name], PA-C [facility] 06/23/2022, 7:08 AM M-F: 7am - 4pm

## 2022-06-24 LAB — GLUCOSE, CAPILLARY
Glucose-Capillary: 133 mg/dL — ABNORMAL HIGH (ref 70–99)
Glucose-Capillary: 182 mg/dL — ABNORMAL HIGH (ref 70–99)
Glucose-Capillary: 114 mg/dL — ABNORMAL HIGH (ref 70–99)
Glucose-Capillary: 110 mg/dL — ABNORMAL HIGH (ref 70–99)

## 2022-06-24 MED ORDER — TRACE MINERALS CU-MN-SE-ZN 300-55-60-3000 MCG/ML IV SOLN
INTRAVENOUS | Status: AC
  Filled 2022-06-24: qty 681.33

## 2022-06-24 NOTE — Progress Notes (Signed)
Mobility Specialist - Progress Note   06/24/22 1524  Mobility  Activity Ambulated independently in hallway  Level of Assistance Independent  Assistive Device None  Distance Ambulated (ft) 1000 ft  Activity Response Tolerated well  Mobility Referral Yes  $Mobility charge 1 Mobility   Candie Mile Mobility Specialist 06/24/22 3:25 PM

## 2022-06-24 NOTE — Progress Notes (Addendum)
PHARMACY - TOTAL PARENTERAL NUTRITION CONSULT NOTE   Indication: Prolonged ileus  Patient Measurements: Height: '4\' 11"'$  (149.9 cm) Weight: 97.7 kg (215 lb 6.2 oz) IBW/kg (Calculated) : 43.2 TPN AdjBW (KG): 55.2 Body mass index is 43.5 kg/m.  Assessment: Debra [name] is a 56 y.o. female s/p laparotomy, excision of greater omental mass, abdominal wall reconstruction with [name], appendectomy, and placement of Prevena vac.  Glucose / Insulin: No apparent history of diabetes. BG <180 (SSI 4x/day + 12u insulin in TPN):    SSI: 5 units /24 hrs Electrolytes: Magnesium persistently at LLN Renal: Scr stable (0.3-0.6) Hepatic: No transaminitis. LFTs within normal limits. TG within normal limits GI Imaging: 9/11 CTAP: no new acute issues GI Surgeries / Procedures: s/p laparotomy, excision of greater omental mass, abdominal wall reconstruction with [name], appendectomy, and placement of Prevena vac  Central access: 02/15/22 TPN start date: 02/15/22  Nutritional Goals: Goal cyclic TPN over 16 hrs: cyclic TPN over 16 hrs: (provides 102 g of protein and 1817 kcals per day) total volume over 16hrs for calculations=1400 ml(1500 ml total with overfill)  RD Assessment:  Estimated Needs Total Energy Estimated Needs: 1800-2100kcal/day Total Protein Estimated Needs: 90-110g/d Total Fluid Estimated Needs: 1.4-1.6L/day  Current Nutrition:  Soft diet + nutritional supplements, not meeting PO needs yet   Plan: Transitioned to *Cyclic* TPN on 0/60.  04/59: Changing to run over 16 hours per MD request. [name] at 2000 per discussion with Dietician to allow patient time for walking in afternoon/evenings.  10/14: To run over 16 hours: -Start rate at 49 mL/hr for 1 hours. -Increase rate to 98 mL/hr for 13 hours.  -Decrease rate to 49 mL/hr for 1 hours. -Decrease rate to 25 mL/hr for 1 hours, then stop.  Nutritional Components Amino acids (using 15% Clinisol): 102  g Dextrose 19% = 266 g Lipids (using 20% SMOFlipids): 50.4 g kCal: 1817/24h  Electrolytes in TPN (standard): Na 57mq/L, K 564m/L, Ca 56m64mL, Mg 10 mEq/L (max recommended), Phos 10 mmol/L. Cl:Ac 1:1 Add MVI to TPN per dietary Continue trace elements and chromium per discussion with RD. Discontinued Zinc 10 mg. Continue CBG/SSI for Cyclic TPN: SSI  -CBG 2 hrs after cyclic TPN start -CBG during middle of cyclic TPN infusion -CBG 1 hr after cylic TPN stopped -CBG while off TPN (=4 CBGs per 24 hr period) + 12u insulin in TPN Check TPN labs on Mon/Thurs at minimum Check K+ 10/18 Monitor for further need of diuresis and adjust as needed for fluid status Lasix 20 mg PO BID added 10/13   HowMcKenziearmacist 06/24/2022 8:56 AM

## 2022-06-24 NOTE — Progress Notes (Signed)
[facility] SURGICAL PROGRESS NOTE (cpt [phone])  Hospital Day(s): 131.   Post op day(s): 129 Days Post-Op.   Interval History:  Patient seen and examined [name] changed 10/10 No new labs this AM She is on soft diet; on cyclic TPN Ambulating independently   Review of Systems:  Constitutional: denies fever, chills  HEENT: denies cough or congestion  Respiratory: denies any shortness of breath  Cardiovascular: denies chest pain or palpitations  Gastrointestinal: denies abdominal pain, N/V Genitourinary: denies burning with urination or urinary frequency Integumentary: + midline wound  Vital signs in last 24 hours: [min-max] current  Temp:  [98.1 F (36.7 C)-98.3 F (36.8 C)] 98.1 F (36.7 C) (10/17 0626) Pulse Rate:  [71-81] 74 (10/17 0626) Resp:  [15-18] 16 (10/17 0626) BP: (92-107)/(50-63) 107/63 (10/17 0626) SpO2:  [96 %-100 %] 100 % (10/17 0626)     Height: '4\' 11"'$  (149.9 cm) Weight: 97.7 kg BMI (Calculated): 43.04   Intake/Output last 2 shifts:  10/16 0701 - 10/17 0700 In: 583.8 [I.V.:583.8] Out: -    Physical Exam:  Constitutional: alert, cooperative and no distress  Respiratory: breathing non-labored at rest  Cardiovascular: regular rate and sinus rhythm  Gastrointestinal: Soft, abdominal soreness on the right, non-distended, no rebound/guarding. Integumentary: Midline wound open, peritoneum closed; granulating;  there are three stomatized areas visible in the LUQ and LLQ portions of the wound, output remains feculent. There is hypergranulation tissue to the inferior wound edge.   Labs:     Latest Ref Rng & Units 06/17/2022    5:03 AM 06/16/2022    8:33 AM 05/29/2022    7:17 PM  CBC  WBC 4.0 - 10.5 K/uL 2.7  2.5    Hemoglobin 12.0 - 15.0 g/dL 8.1  6.8  9.3   Hematocrit 36.0 - 46.0 % 25.6  21.8  30.3   Platelets 150 - 400 K/uL 69  65        Latest Ref Rng & Units 06/23/2022    5:26 AM 06/22/2022    9:33 AM 06/19/2022    5:08 AM  CMP   Glucose 70 - 99 mg/dL 162  163  168   BUN 6 - 20 mg/dL '21  20  19   '$ Creatinine 0.44 - 1.00 mg/dL 0.44  0.52  0.50   Sodium 135 - 145 mmol/L 137  135  136   Potassium 3.5 - 5.1 mmol/L 3.1  3.6  3.8   Chloride 98 - 111 mmol/L 102  104  107   CO2 22 - 32 mmol/L '29  27  26   '$ Calcium 8.9 - 10.3 mg/dL 7.9  7.9  8.2   Total Protein 6.5 - 8.1 g/dL 6.1   6.3   Total Bilirubin 0.3 - 1.2 mg/dL 0.5   0.6   Alkaline Phos 38 - 126 U/L 103   106   AST 15 - 41 U/L 25   30   ALT 0 - 44 U/L 17   18     Imaging studies: No new pertinent imaging studies   Assessment/Plan:  56 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   - New 10/17: No issues   - Wound Care: [name] pouch changed 10/10 secondary to to leak. Back to [name] pouch with "window" but not on  intermittent or continuous suction. Patient seems to like this better. She will need intermittent suction with Yankauer to control effluent. Likely be beneficial to suction before ambulation. Changed 10/10   - On Soft diet + nutritional supplementation - Continue TPN; transition to QHS - Appreciate dietary assistance with trace elements; iron supplementation            - Monitor abdominal examination; on-going bowel function            - Pain control prn; antemetic prn            - Out of bed; discharged from therapies; no recommendations at this time   - Discharge Planning: Anticipate lengthy admission and potential eventual transfer; no new progress    All of the above findings and recommendations were discussed with the patient, and the medical team, and all of patient's questions were answered to her expressed satisfaction.  -- [name], PA-C [facility] 06/24/2022, 7:20 AM M-F: 7am - 4pm

## 2022-06-24 NOTE — Progress Notes (Signed)
End of shift note:    There were not any significant events during this shift. VSS. Eaking pouch was suctioned as need. Schedule medications were given.

## 2022-06-24 NOTE — Progress Notes (Signed)
Nutrition Follow-up  DOCUMENTATION CODES:   Obesity unspecified  INTERVENTION:   Continue cyclic TPN per pharmacy (16 hrs)- provides 1817kcal/day and 102g/day protein   Ensure Enlive po TID, each supplement provides 350 kcal and 20 grams of protein.  MVI, chromium and trace elements in TPN   Daily weights   Vitamin D3- Increase to 3000 units po daily   Vitamin A- 10,000 units po daily   Vitamins C, A, B1, B6, D, E and K1 pending  Iron sucrose 286m IV daily x 3 days   NUTRITION DIAGNOSIS:   Increased nutrient needs related to wound healing, catabolic illness as evidenced by estimated needs.  GOAL:   Patient will meet greater than or equal to 90% of their needs -met with TPN   MONITOR:   PO intake, Supplement acceptance, Labs, Weight trends, Diet advancement, I & O's, TPN  ASSESSMENT:   56y/o female with h/o hypothyroidism, COVID 19 (3/21), kidney stones and stage 3 colon cancer (s/p left hemicolectomy 5/21 and chemotherapy) who is admitted for new pelvic mass now s/p laparotomy 6/8 (with excision of pelvic mass from greater omentum, abdominal wall reconstruction with bilateral myocutaneous flaps and mesh, incisional hernia repair, appendectomy repair and VAC placement) complicated by bowel perforation s/p reopening of recent laparotomy 6/10 (with repair of small bowel perforation, excision of mesh, placement of two phasix mesh and VAC placement). Pathology returned as metastatic adenocarcinoma.   Pt starting to develop some volume overload. Per chart, pt was up ~20lbs since admission on 10/12. Cyclic TPN changed to be given over 16 hours. Lasix initiated. Pt currently up ~15lbs since admission; volume status improved. Refeed labs stable. Triglycerides wnl; can decrease to monthly checks as labs have been stable for 3 months. Hyperglycemia improved with insulin in TPN. Trace elements and MVI added back to TPN. Manganese improved but remains elevated; unable to remove  manganese from TPN as this will remove all trace elements. Will monitor manganese levels monthly to avoid toxicity.  Vitamin D returned lower. RD with concerns for malabsorption; will increase cholecalciferol to 3000 units po daily. Iron improved but remains low/normal; will repeat IV iron supplementation. Pt remains on a soft diet; pt eating <25% of meals. Pt is drinking some Ensure. [name] pouch remains in place; last changed 10/10.    Medications reviewed and include: D3, lasix, insulin, L-glutamine, synthroid, imodium, protonix, carafate, vitamin A, iron sucrose   -Selenium 81(L), Chromium 1.4 wnl, Manganese 26.3(H), Iodine 57.9 wnl, copper 97 wnl, zinc 90 wnl- 10/2 -Vitamin B1- 59.6(L), vitamin A 18.5(L), B6 13.3 wnl, vitamin E 11.7 wnl, vitamin K 0.32 wnl- 9/14  Labs reviewed: Na 137 wnl, K 3.1(L), BUN 21(H), P 3.7 wnl, Mg 2.0 wnl- 10/16 Triglycerides- 98-10/16 Iron Ferritin- 31.0 wnl, TIBC 360, ferritin 33 wnl, folate 10.5 wnl, B12 291- 10/16 Vitamin D- 19.46- 10/16 Cbgs- 182, 133 x 24 hrs  Diet Order:    Diet Order             DIET SOFT Room service appropriate? Yes; Fluid consistency: Thin  Diet effective now                  EDUCATION NEEDS:   Not appropriate for education at this time  Skin:  Skin Assessment: Reviewed RN Assessment ( Midline wound: 15cm x 10cm x 6cm )  Last BM:  10/17- type 5  Height:   Ht Readings from Last 1 Encounters:  03/01/22 _0  (1.499 m)    Weight:  Wt Readings from Last 1 Encounters:  06/23/22 97.7 kg    Ideal Body Weight:  44.3 kg  BMI:  Body mass index is 43.5 kg/m.  Estimated Nutritional Needs:   Kcal:  1800-2100kcal/day  Protein:  90-110g/d  Fluid:  1.4-1.6L/day  Koleen Distance MS, RD, LDN Please refer to [facility] for RD and/or RD on-call/weekend/after hours pager

## 2022-06-25 LAB — GLUCOSE, CAPILLARY
Glucose-Capillary: 159 mg/dL — ABNORMAL HIGH (ref 70–99)
Glucose-Capillary: 163 mg/dL — ABNORMAL HIGH (ref 70–99)
Glucose-Capillary: 145 mg/dL — ABNORMAL HIGH (ref 70–99)
Glucose-Capillary: 105 mg/dL — ABNORMAL HIGH (ref 70–99)
Glucose-Capillary: 108 mg/dL — ABNORMAL HIGH (ref 70–99)

## 2022-06-25 LAB — POTASSIUM: Potassium: 3.3 mmol/L — ABNORMAL LOW (ref 3.5–5.1)

## 2022-06-25 MED ORDER — POTASSIUM CHLORIDE CRYS ER 20 MEQ PO TBCR
20.0000 meq | EXTENDED_RELEASE_TABLET | Freq: Once | ORAL | Status: AC
  Administered 2022-06-25: 20 meq via ORAL
  Filled 2022-06-25: qty 1

## 2022-06-25 MED ORDER — TRACE MINERALS CU-MN-SE-ZN 300-55-60-3000 MCG/ML IV SOLN
INTRAVENOUS | Status: AC
  Filled 2022-06-25: qty 681.33

## 2022-06-25 NOTE — Progress Notes (Signed)
PHARMACY - TOTAL PARENTERAL NUTRITION CONSULT NOTE   Indication: Prolonged ileus  Patient Measurements: Height: '4\' 11"'$  (149.9 cm) Weight: 96.9 kg (213 lb 10 oz) IBW/kg (Calculated) : 43.2 TPN AdjBW (KG): 55.2 Body mass index is 43.15 kg/m.  Assessment: Debra [name] is a 56 y.o. female s/p laparotomy, excision of greater omental mass, abdominal wall reconstruction with [name], appendectomy, and placement of Prevena vac.  Glucose / Insulin: No apparent history of diabetes. BG <180 (SSI 4x/day + 12u insulin in TPN):    SSI: 6 units /24 hrs Electrolytes: K = 3.3 Renal: Scr stable (0.3-0.6) Hepatic: No transaminitis. LFTs within normal limits. TG within normal limits GI Imaging: 9/11 CTAP: no new acute issues GI Surgeries / Procedures: s/p laparotomy, excision of greater omental mass, abdominal wall reconstruction with [name], appendectomy, and placement of Prevena vac  Central access: 02/15/22 TPN start date: 02/15/22  Nutritional Goals: Goal cyclic TPN over 16 hrs: cyclic TPN over 16 hrs: (provides 102 g of protein and 1817 kcals per day) total volume over 16hrs for calculations=1400 ml(1500 ml total with overfill)  RD Assessment:  Estimated Needs Total Energy Estimated Needs: 1800-2100kcal/day Total Protein Estimated Needs: 90-110g/d Total Fluid Estimated Needs: 1.4-1.6L/day  Current Nutrition:  Soft diet + nutritional supplements, not meeting PO needs yet   Plan: Transitioned to *Cyclic* TPN on 2/45.  80/99: Changing to run over 16 hours per MD request. [name] at 2000 per discussion with Dietician to allow patient time for walking in afternoon/evenings.  10/14: To run over 16 hours: -Start rate at 49 mL/hr for 1 hours. -Increase rate to 98 mL/hr for 13 hours.  -Decrease rate to 49 mL/hr for 1 hours. -Decrease rate to 25 mL/hr for 1 hours, then stop.  Nutritional Components Amino acids (using 15% Clinisol): 102 g Dextrose 19% = 266  g Lipids (using 20% SMOFlipids): 50.4 g kCal: 1817/24h  Electrolytes in TPN (standard): Na 672mq/L, K 5572m/L, Ca 72m11mL, Mg 10 mEq/L (max recommended), Phos 10 mmol/L. Cl:Ac 1:1 Add MVI to TPN per dietary Continue trace elements and chromium per discussion with RD. Discontinued Zinc 10 mg. Continue CBG/SSI for Cyclic TPN: SSI  -CBG 2 hrs after cyclic TPN start -CBG during middle of cyclic TPN infusion -CBG 1 hr after cylic TPN stopped -CBG while off TPN (=4 CBGs per 24 hr period) + 12u insulin in TPN Check TPN labs on Mon/Thurs at minimum Give Kcl 20 meq po x 1 Monitor for further need of diuresis and adjust as needed for fluid status Lasix 20 mg PO BID added 10/13   HowLorin Picketinical Pharmacist 06/25/2022 8:21 AM

## 2022-06-25 NOTE — Progress Notes (Signed)
[facility] SURGICAL PROGRESS NOTE (cpt [phone])  Hospital Day(s): 132.   Post op day(s): 130 Days Post-Op.   Interval History:  Patient seen and examined [name] changed 10/10; RN staff managing effluent with intermittent suction Mild hypokalemia to 3.3; otherwise no new labs  She is on soft diet; on cyclic TPN Ambulating independently   Review of Systems:  Constitutional: denies fever, chills  HEENT: denies cough or congestion  Respiratory: denies any shortness of breath  Cardiovascular: denies chest pain or palpitations  Gastrointestinal: denies abdominal pain, N/V Genitourinary: denies burning with urination or urinary frequency Integumentary: + midline wound  Vital signs in last 24 hours: [min-max] current  Temp:  [98.1 F (36.7 C)-98.5 F (36.9 C)] 98.2 F (36.8 C) (10/18 0250) Pulse Rate:  [75-84] 76 (10/18 0250) Resp:  [14-16] 15 (10/18 0250) BP: (95-105)/(47-65) 97/47 (10/18 0250) SpO2:  [92 %-97 %] 95 % (10/18 0250) Weight:  [96.9 kg] 96.9 kg (10/18 0500)     Height: '4\' 11"'$  (149.9 cm) Weight: 96.9 kg BMI (Calculated): 43.04   Intake/Output last 2 shifts:  10/17 0701 - 10/18 0700 In: 835.8 [I.V.:726.5; IV Piggyback:109.3] Out: 650 [Drains:650]   Physical Exam:  Constitutional: alert, cooperative and no distress  Respiratory: breathing non-labored at rest  Cardiovascular: regular rate and sinus rhythm  Gastrointestinal: Soft, abdominal soreness on the right, non-distended, no rebound/guarding. Integumentary: Midline wound open, peritoneum closed; granulating;  there are three stomatized areas visible in the LUQ and LLQ portions of the wound, output remains feculent. There is hypergranulation tissue to the inferior wound edge.   Labs:     Latest Ref Rng & Units 06/17/2022    5:03 AM 06/16/2022    8:33 AM 05/29/2022    7:17 PM  CBC  WBC 4.0 - 10.5 K/uL 2.7  2.5    Hemoglobin 12.0 - 15.0 g/dL 8.1  6.8  9.3   Hematocrit 36.0 - 46.0 % 25.6  21.8   30.3   Platelets 150 - 400 K/uL 69  65        Latest Ref Rng & Units 06/25/2022    5:22 AM 06/23/2022    5:26 AM 06/22/2022    9:33 AM  CMP  Glucose 70 - 99 mg/dL  162  163   BUN 6 - 20 mg/dL  21  20   Creatinine 0.44 - 1.00 mg/dL  0.44  0.52   Sodium 135 - 145 mmol/L  137  135   Potassium 3.5 - 5.1 mmol/L 3.3  3.1  3.6   Chloride 98 - 111 mmol/L  102  104   CO2 22 - 32 mmol/L  29  27   Calcium 8.9 - 10.3 mg/dL  7.9  7.9   Total Protein 6.5 - 8.1 g/dL  6.1    Total Bilirubin 0.3 - 1.2 mg/dL  0.5    Alkaline Phos 38 - 126 U/L  103    AST 15 - 41 U/L  25    ALT 0 - 44 U/L  17      Imaging studies: No new pertinent imaging studies   Assessment/Plan:  56 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   - New 10/18: No issues; replete K   - Wound Care: [name] pouch changed 10/10 secondary to  to leak. Back to [name] pouch with "window" but not on intermittent or continuous suction. Patient seems to like this better. She will need intermittent suction with Yankauer to control effluent. Likely be beneficial to suction before ambulation. Changed 10/10   - On Soft diet + nutritional supplementation - Continue TPN; transition to QHS - Appreciate dietary assistance with trace elements; iron supplementation            - Monitor abdominal examination; on-going bowel function            - Pain control prn; antemetic prn            - Out of bed; discharged from therapies; no recommendations at this time   - Discharge Planning: Anticipate lengthy admission and potential eventual transfer; no new progress    All of the above findings and recommendations were discussed with the patient, and the medical team, and all of patient's  questions were answered to her expressed satisfaction.  -- [name], PA-C [facility] 06/25/2022, 7:13 AM M-F: 7am - 4pm

## 2022-06-25 NOTE — TOC Progression Note (Signed)
Transition of Care [facility]) - Progression Note    Patient Details  Name: Debra [name] MRN: [id number] Date of Birth: 1966/03/18  [facility]) CM/SW Contact  Beverly Sessions, RN Phone Number: 06/25/2022, 9:19 AM  Clinical Narrative:     Per surgery team no new progress  Expected Discharge Plan: [place] Barriers to Discharge: Continued Medical Work up  Expected Discharge Plan and Services Expected Discharge Plan: [place]                                               Social Determinants of Health (SDOH) Interventions    Readmission Risk Interventions     No data to display

## 2022-06-26 LAB — COMPREHENSIVE METABOLIC PANEL
ALT: 17 U/L (ref 0–44)
AST: 39 U/L (ref 15–41)
Albumin: 2.7 g/dL — ABNORMAL LOW (ref 3.5–5.0)
Alkaline Phosphatase: 107 U/L (ref 38–126)
Anion gap: 7 (ref 5–15)
BUN: 23 mg/dL — ABNORMAL HIGH (ref 6–20)
CO2: 26 mmol/L (ref 22–32)
Calcium: 8.3 mg/dL — ABNORMAL LOW (ref 8.9–10.3)
Chloride: 103 mmol/L (ref 98–111)
Creatinine, Ser: 0.57 mg/dL (ref 0.44–1.00)
GFR, Estimated: >60 mL/min (ref >60)
Glucose, Bld: 169 mg/dL — ABNORMAL HIGH (ref 70–99)
Potassium: 3.7 mmol/L (ref 3.5–5.1)
Sodium: 136 mmol/L (ref 135–145)
Total Bilirubin: 0.6 mg/dL (ref 0.3–1.2)
Total Protein: 6.2 g/dL — ABNORMAL LOW (ref 6.5–8.1)

## 2022-06-26 LAB — GLUCOSE, CAPILLARY
Glucose-Capillary: 157 mg/dL — ABNORMAL HIGH (ref 70–99)
Glucose-Capillary: 193 mg/dL — ABNORMAL HIGH (ref 70–99)
Glucose-Capillary: 91 mg/dL (ref 70–99)
Glucose-Capillary: 143 mg/dL — ABNORMAL HIGH (ref 70–99)

## 2022-06-26 LAB — MAGNESIUM: Magnesium: 1.9 mg/dL (ref 1.7–2.4)

## 2022-06-26 LAB — VITAMIN B6: Vitamin B6: 11.9 ug/L (ref 3.4–65.2)

## 2022-06-26 LAB — VITAMIN E
Vitamin E (Alpha Tocopherol): 13.4 mg/L (ref 7.0–25.1)
Vitamin E(Gamma Tocopherol): 0.9 mg/L (ref 0.5–5.5)

## 2022-06-26 LAB — VITAMIN A: Vitamin A (Retinoic Acid): 14.3 ug/dL — ABNORMAL LOW (ref 20.1–62.0)

## 2022-06-26 LAB — PHOSPHORUS: Phosphorus: 3.3 mg/dL (ref 2.5–4.6)

## 2022-06-26 LAB — VITAMIN K1, SERUM: VITAMIN K1: 0.64 ng/mL (ref 0.10–2.20)

## 2022-06-26 LAB — VITAMIN B1: Vitamin B1 (Thiamine): 117.2 nmol/L (ref 66.5–200.0)

## 2022-06-26 MED ORDER — TRACE MINERALS CU-MN-SE-ZN 300-55-60-3000 MCG/ML IV SOLN
INTRAVENOUS | Status: AC
  Filled 2022-06-26: qty 681.33

## 2022-06-26 NOTE — Consult Note (Signed)
[place] Nurse wound follow up Patient receiving care in [facility] 210 Wound type: Fistuale in abdominal wound requiring [name] pouch and intermittent suctioning. The pouch the patient had on was dated 10/16. It was not leaking and s/s of impending leakage.  Supplies in room for future use.  Val Riles, RN, MSN, CWOCN, CNS-BC, pager [phone]

## 2022-06-26 NOTE — Progress Notes (Signed)
PHARMACY - TOTAL PARENTERAL NUTRITION CONSULT NOTE   Indication: Prolonged ileus  Patient Measurements: Height: '4\' 11"'$  (149.9 cm) Weight: 97 kg (213 lb 13.5 oz) IBW/kg (Calculated) : 43.2 TPN AdjBW (KG): 55.2 Body mass index is 43.19 kg/m.  Assessment: Debra [name] is a 56 y.o. female s/p laparotomy, excision of greater omental mass, abdominal wall reconstruction with [name], appendectomy, and placement of Prevena vac.  Glucose / Insulin: No apparent history of diabetes. BG 105 - 169 (SSI 4x/day + 12u insulin in TPN):    SSI: 5 units /24 hrs Electrolytes:  Renal: Scr stable at apparent baseline Hepatic: No transaminitis. LFTs within normal limits. TG within normal limits GI Imaging: 9/11 CTAP: no new acute issues GI Surgeries / Procedures: s/p laparotomy, excision of greater omental mass, abdominal wall reconstruction with [name], appendectomy, and placement of Prevena vac  Central access: 02/15/22 TPN start date: 02/15/22  Nutritional Goals: Goal cyclic TPN over 16 hrs: cyclic TPN over 16 hrs: (provides 102 g of protein and 1817 kcals per day) total volume over 16hrs for calculations=1400 ml(1500 ml total with overfill)  RD Assessment:  Estimated Needs Total Energy Estimated Needs: 1800-2100kcal/day Total Protein Estimated Needs: 90-110g/d Total Fluid Estimated Needs: 1.4-1.6L/day  Current Nutrition:  Soft diet + nutritional supplements, not meeting PO needs yet   Plan: Transitioned to *Cyclic* TPN on 2/70.   to run over 16 hours per MD request. [name] at 2000 per discussion with Dietician to allow patient time for walking in afternoon/evenings.  To run over 16 hours: -Start rate at 49 mL/hr for 1 hours. -Increase rate to 98 mL/hr for 13 hours.  -Decrease rate to 49 mL/hr for 1 hours. -Decrease rate to 25 mL/hr for 1 hours, then stop.  Nutritional Components Amino acids (using 15% Clinisol): 102 g Dextrose 19% = 266 g Lipids  (using 20% SMOFlipids): 50.4 g kCal: 1817/24h  Electrolytes in TPN (standard): Na 70mq/L, K 516m/L, Ca 27m2mL, Mg 10 mEq/L (max recommended), Phos 10 mmol/L. Cl:Ac 1:1 Add MVI to TPN per dietary Continue trace elements and chromium per discussion with RD. Discontinued Zinc  Continue CBG/SSI for Cyclic TPN: SSI  -CBG 2 hrs after cyclic TPN start -CBG during middle of cyclic TPN infusion -CBG 1 hr after cylic TPN stopped -CBG while off TPN (=4 CBGs per 24 hr period) + 12u insulin in TPN Check TPN labs on Mon/Thurs at minimum Monitor for further need of diuresis and adjust as needed for fluid status Lasix 20 mg PO BID added 10/13   RodDallie Pilesinical Pharmacist 06/26/2022 8:39 AM

## 2022-06-26 NOTE — Plan of Care (Signed)
  Problem: Clinical Measurements: Goal: Will remain free from infection Outcome: Progressing   Problem: Nutrition: Goal: Adequate nutrition will be maintained Outcome: Progressing   Problem: Activity: Goal: Risk for activity intolerance will decrease Outcome: Progressing   

## 2022-06-26 NOTE — Progress Notes (Signed)
[facility] SURGICAL PROGRESS NOTE (cpt [phone])  Hospital Day(s): 133.   Post op day(s): 131 Days Post-Op.   Interval History:  Patient seen and examined [name] changed 10/16; RN staff managing effluent with intermittent suction Nutritional labs are reassuring  She is on soft diet; on cyclic TPN Ambulating independently   Review of Systems:  Constitutional: denies fever, chills  HEENT: denies cough or congestion  Respiratory: denies any shortness of breath  Cardiovascular: denies chest pain or palpitations  Gastrointestinal: denies abdominal pain, N/V Genitourinary: denies burning with urination or urinary frequency Integumentary: + midline wound  Vital signs in last 24 hours: [min-max] current  Temp:  [98 F (36.7 C)-98.3 F (36.8 C)] 98.2 F (36.8 C) (10/19 0854) Pulse Rate:  [76-83] 83 (10/19 0854) Resp:  [15-20] 16 (10/19 0854) BP: (95-107)/(49-59) 97/49 (10/19 0854) SpO2:  [96 %-100 %] 96 % (10/19 0854) Weight:  [97 kg] 97 kg (10/19 0500)     Height: '4\' 11"'$  (149.9 cm) Weight: 97 kg BMI (Calculated): 43.04   Intake/Output last 2 shifts:  10/18 0701 - 10/19 0700 In: 1082.1 [P.O.:180; I.V.:791.9; IV Piggyback:110.2] Out: 850 [Drains:850]   Physical Exam:  Constitutional: alert, cooperative and no distress  Respiratory: breathing non-labored at rest  Cardiovascular: regular rate and sinus rhythm  Gastrointestinal: Soft, abdominal soreness on the right, non-distended, no rebound/guarding. Integumentary: Midline wound open, peritoneum closed; granulating;  there are three stomatized areas visible in the LUQ and LLQ portions of the wound, output remains feculent. There is hypergranulation tissue to the inferior wound edge.   Labs:     Latest Ref Rng & Units 06/17/2022    5:03 AM 06/16/2022    8:33 AM 05/29/2022    7:17 PM  CBC  WBC 4.0 - 10.5 K/uL 2.7  2.5    Hemoglobin 12.0 - 15.0 g/dL 8.1  6.8  9.3   Hematocrit 36.0 - 46.0 % 25.6  21.8  30.3    Platelets 150 - 400 K/uL 69  65        Latest Ref Rng & Units 06/26/2022    6:45 AM 06/25/2022    5:22 AM 06/23/2022    5:26 AM  CMP  Glucose 70 - 99 mg/dL 169   162   BUN 6 - 20 mg/dL 23   21   Creatinine 0.44 - 1.00 mg/dL 0.57   0.44   Sodium 135 - 145 mmol/L 136   137   Potassium 3.5 - 5.1 mmol/L 3.7  3.3  3.1   Chloride 98 - 111 mmol/L 103   102   CO2 22 - 32 mmol/L 26   29   Calcium 8.9 - 10.3 mg/dL 8.3   7.9   Total Protein 6.5 - 8.1 g/dL 6.2   6.1   Total Bilirubin 0.3 - 1.2 mg/dL 0.6   0.5   Alkaline Phos 38 - 126 U/L 107   103   AST 15 - 41 U/L 39   25   ALT 0 - 44 U/L 17   17     Imaging studies: No new pertinent imaging studies   Assessment/Plan:  56 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   - New 10/19: No issues; potassium normalized   - Wound Care: [name] pouch changed 10/10  secondary to to leak. Back to [name] pouch with "window" but not on intermittent or continuous suction. Patient seems to like this better. She will need intermittent suction with Yankauer to control effluent. Likely be beneficial to suction before ambulation. Changed 10/16   - On Soft diet + nutritional supplementation - Continue TPN; transition to QHS - Appreciate dietary assistance with trace elements; iron supplementation            - Monitor abdominal examination; on-going bowel function            - Pain control prn; antemetic prn            - Out of bed; discharged from therapies; no recommendations at this time   - Discharge Planning: Anticipate lengthy admission and potential eventual transfer; no new progress    All of the above findings and recommendations were discussed with the patient, and the medical team, and  all of patient's questions were answered to her expressed satisfaction.  -- Debra Simon, PA-C [facility] 06/26/2022, 9:29 AM M-F: 7am - 4pm

## 2022-06-27 LAB — GLUCOSE, CAPILLARY
Glucose-Capillary: 127 mg/dL — ABNORMAL HIGH (ref 70–99)
Glucose-Capillary: 119 mg/dL — ABNORMAL HIGH (ref 70–99)
Glucose-Capillary: 128 mg/dL — ABNORMAL HIGH (ref 70–99)

## 2022-06-27 MED ORDER — TRACE MINERALS CU-MN-SE-ZN 300-55-60-3000 MCG/ML IV SOLN
INTRAVENOUS | Status: AC
  Filled 2022-06-27: qty 681.33

## 2022-06-27 NOTE — Progress Notes (Signed)
PHARMACY - TOTAL PARENTERAL NUTRITION CONSULT NOTE   Indication: Prolonged ileus  Patient Measurements: Height: '4\' 11"'$  (149.9 cm) Weight: 100.3 kg (221 lb 1.9 oz) IBW/kg (Calculated) : 43.2 TPN AdjBW (KG): 55.2 Body mass index is 44.66 kg/m.  Assessment: Debra [name] is a 56 y.o. female s/p laparotomy, excision of greater omental mass, abdominal wall reconstruction with [name], appendectomy, and placement of Prevena vac.  Glucose / Insulin: No apparent history of diabetes. BG 91 - 193 (SSI 4x/day + 10u insulin in TPN):    SSI: 57 units /24 hrs Electrolytes:  Renal: Scr stable at apparent baseline Hepatic: No transaminitis. LFTs within normal limits. TG within normal limits GI Imaging: 9/11 CTAP: no new acute issues GI Surgeries / Procedures: s/p laparotomy, excision of greater omental mass, abdominal wall reconstruction with [name], appendectomy, and placement of Prevena vac  Central access: 02/15/22 TPN start date: 02/15/22  Nutritional Goals: Goal cyclic TPN over 16 hrs: cyclic TPN over 16 hrs: (provides 102 g of protein and 1817 kcals per day) total volume over 16hrs for calculations=1400 ml(1500 ml total with overfill)  RD Assessment:  Estimated Needs Total Energy Estimated Needs: 1800-2100kcal/day Total Protein Estimated Needs: 90-110g/d Total Fluid Estimated Needs: 1.4-1.6L/day  Current Nutrition:  Soft diet + nutritional supplements, not meeting PO needs yet   Plan: Transitioned to *Cyclic* TPN on 2/11.   to run over 16 hours per MD request. [name] at 2000 per discussion with Dietician to allow patient time for walking in afternoon/evenings.  To run over 16 hours: -Start rate at 49 mL/hr for 1 hours. -Increase rate to 98 mL/hr for 13 hours.  -Decrease rate to 49 mL/hr for 1 hours. -Decrease rate to 25 mL/hr for 1 hours, then stop.  Nutritional Components Amino acids (using 15% Clinisol): 102 g Dextrose 19% = 266 g Lipids  (using 20% SMOFlipids): 50.4 g kCal: 1817/24h  Electrolytes in TPN (standard): Na 42mq/L, K 594m/L, Ca 40m68mL, Mg 10 mEq/L (max recommended), Phos 10 mmol/L. Cl:Ac 1:1 Add MVI to TPN  Continue trace elements and chromium per discussion with RD. Discontinued Zinc  Continue CBG/SSI for Cyclic TPN: SSI  -CBG 2 hrs after cyclic TPN start -CBG during middle of cyclic TPN infusion -CBG 1 hr after cylic TPN stopped -CBG while off TPN (=4 CBGs per 24 hr period) + 10u insulin in TPN Check TPN labs on Mon/Thurs at minimum Monitor for further need of diuresis and adjust as needed for fluid status Lasix 20 mg PO BID added 10/13   RodDallie Pilesinical Pharmacist 06/27/2022 9:03 AM

## 2022-06-27 NOTE — Plan of Care (Signed)
  Problem: Activity: Goal: Risk for activity intolerance will decrease Outcome: Progressing   Problem: Pain Managment: Goal: General experience of comfort will improve Outcome: Progressing   

## 2022-06-27 NOTE — Progress Notes (Signed)
Pt seen and examined.  No new issues EC fistula contained Ambulating , taking PO  PE NAD Abd: eakin pouch w/o leaks  A/P Continue current supportive care

## 2022-06-27 NOTE — TOC Progression Note (Signed)
Transition of Care [facility]) - Progression Note    Patient Details  Name: Debra [name] MRN: [id number] Date of Birth: 11-26-1965  [facility]) CM/SW Contact  Laurena Slimmer, RN Phone Number: 06/27/2022, 12:08 PM  Clinical Narrative:    No new progress.    Expected Discharge Plan: [place] Barriers to Discharge: Continued Medical Work up  Expected Discharge Plan and Services Expected Discharge Plan: [place]                                               Social Determinants of Health (SDOH) Interventions    Readmission Risk Interventions     No data to display

## 2022-06-28 LAB — GLUCOSE, CAPILLARY: Glucose-Capillary: 169 mg/dL — ABNORMAL HIGH (ref 70–99)

## 2022-06-28 MED ORDER — TRACE MINERALS CU-MN-SE-ZN 300-55-60-3000 MCG/ML IV SOLN
INTRAVENOUS | Status: AC
  Filled 2022-06-28: qty 681.33

## 2022-06-28 NOTE — Progress Notes (Signed)
Pt seen and examined. In good spirits Having breakfast  No new issues EC fistula contained Ambulating , taking PO   PE NAD Abd: eakin pouch w/o leaks   A/P Continue current supportive care

## 2022-06-28 NOTE — Plan of Care (Signed)
  Problem: Nutrition: Goal: Adequate nutrition will be maintained Outcome: Progressing   Problem: Pain Managment: Goal: General experience of comfort will improve Outcome: Progressing   

## 2022-06-28 NOTE — Progress Notes (Signed)
PHARMACY - TOTAL PARENTERAL NUTRITION CONSULT NOTE   Indication: Prolonged ileus  Patient Measurements: Height: '4\' 11"'$  (149.9 cm) Weight: 97.2 kg (214 lb 4.6 oz) IBW/kg (Calculated) : 43.2 TPN AdjBW (KG): 55.2 Body mass index is 43.28 kg/m.  Assessment: Debra [name] is a 56 y.o. female s/p laparotomy, excision of greater omental mass, abdominal wall reconstruction with [name], appendectomy, and placement of Prevena vac.  Glucose / Insulin: No apparent history of diabetes. BG 119 - 169 (SSI 4x/day + 10u insulin in TPN):    SSI: 8 units /24 hrs Electrolytes:  Renal: Scr stable at apparent baseline Hepatic: No transaminitis. LFTs within normal limits. TG within normal limits GI Imaging: 9/11 CTAP: no new acute issues GI Surgeries / Procedures: s/p laparotomy, excision of greater omental mass, abdominal wall reconstruction with [name], appendectomy, and placement of Prevena vac  Central access: 02/15/22 TPN start date: 02/15/22  Nutritional Goals: Goal cyclic TPN over 16 hrs: cyclic TPN over 16 hrs: (provides 102 g of protein and 1817 kcals per day) total volume over 16hrs for calculations=1400 ml(1500 ml total with overfill)  RD Assessment:  Estimated Needs Total Energy Estimated Needs: 1800-2100kcal/day Total Protein Estimated Needs: 90-110g/d Total Fluid Estimated Needs: 1.4-1.6L/day  Current Nutrition:  Soft diet + nutritional supplements, not meeting PO needs yet   Plan: Transitioned to *Cyclic* TPN on 1/74.   to run over 16 hours per MD request. [name] at 2000 per discussion with Dietician to allow patient time for walking in afternoon/evenings.  To run over 16 hours: -Start rate at 49 mL/hr for 1 hours. -Increase rate to 98 mL/hr for 13 hours.  -Decrease rate to 49 mL/hr for 1 hours. -Decrease rate to 25 mL/hr for 1 hours, then stop.  Nutritional Components Amino acids (using 15% Clinisol): 102 g Dextrose 19% = 266 g Lipids  (using 20% SMOFlipids): 50.4 g kCal: 1817/24h  Electrolytes in TPN (standard): Na 24mq/L, K 513m/L, Ca 26m11mL, Mg 10 mEq/L (max recommended), Phos 10 mmol/L. Cl:Ac 1:1 Add MVI to TPN  Continue trace elements and chromium per discussion with RD. Discontinued Zinc  Continue CBG/SSI for Cyclic TPN: SSI  -CBG 2 hrs after cyclic TPN start -CBG during middle of cyclic TPN infusion -CBG 1 hr after cylic TPN stopped -CBG while off TPN (=4 CBGs per 24 hr period) + 10u insulin in TPN Check TPN labs on Mon/Thurs at minimum Monitor for further need of diuresis and adjust as needed for fluid status Lasix 20 mg PO BID added 10/13   Nickey Canedo A Aneesha Holloran Clinical Pharmacist 06/28/2022 8:21 AM

## 2022-06-29 LAB — GLUCOSE, CAPILLARY: Glucose-Capillary: 120 mg/dL — ABNORMAL HIGH (ref 70–99)

## 2022-06-29 MED ORDER — TRACE MINERALS CU-MN-SE-ZN 300-55-60-3000 MCG/ML IV SOLN
INTRAVENOUS | Status: AC
  Filled 2022-06-29: qty 681.33

## 2022-06-29 NOTE — Plan of Care (Signed)
  Problem: Clinical Measurements: Goal: Will remain free from infection Outcome: Progressing   Problem: Nutrition: Goal: Adequate nutrition will be maintained Outcome: Progressing   Problem: Activity: Goal: Risk for activity intolerance will decrease Outcome: Progressing   Problem: Nutrition: Goal: Adequate nutrition will be maintained Outcome: Progressing   Problem: Coping: Goal: Level of anxiety will decrease Outcome: Progressing   Problem: Pain Managment: Goal: General experience of comfort will improve Outcome: Progressing   

## 2022-06-29 NOTE — Progress Notes (Signed)
PHARMACY - TOTAL PARENTERAL NUTRITION CONSULT NOTE   Indication: Prolonged ileus  Patient Measurements: Height: '4\' 11"'$  (149.9 cm) Weight: 97.2 kg (214 lb 4.6 oz) IBW/kg (Calculated) : 43.2 TPN AdjBW (KG): 55.2 Body mass index is 43.28 kg/m.  Assessment: Debra [name] is a 56 y.o. female s/p laparotomy, excision of greater omental mass, abdominal wall reconstruction with [name], appendectomy, and placement of Prevena vac.  Glucose / Insulin: No apparent history of diabetes. BG 119 - 169 (SSI 4x/day + 10u insulin in TPN):    SSI: 6 units /24 hrs Electrolytes:  Renal: Scr stable at apparent baseline Hepatic: No transaminitis. LFTs within normal limits. TG within normal limits GI Imaging: 9/11 CTAP: no new acute issues GI Surgeries / Procedures: s/p laparotomy, excision of greater omental mass, abdominal wall reconstruction with [name], appendectomy, and placement of Prevena vac  Central access: 02/15/22 TPN start date: 02/15/22  Nutritional Goals: Goal cyclic TPN over 16 hrs: cyclic TPN over 16 hrs: (provides 102 g of protein and 1817 kcals per day) total volume over 16hrs for calculations=1400 ml(1500 ml total with overfill)  RD Assessment:  Estimated Needs Total Energy Estimated Needs: 1800-2100kcal/day Total Protein Estimated Needs: 90-110g/d Total Fluid Estimated Needs: 1.4-1.6L/day  Current Nutrition:  Soft diet + nutritional supplements, not meeting PO needs yet   Plan: Transitioned to *Cyclic* TPN on 9/62.   to run over 16 hours per MD request. [name] at 2000 per discussion with Dietician to allow patient time for walking in afternoon/evenings.  To run over 16 hours: -Start rate at 49 mL/hr for 1 hour. -Increase rate to 98 mL/hr for 13 hours.  -Decrease rate to 49 mL/hr for 1 hour. -Decrease rate to 25 mL/hr for 1 hours, then stop.  Nutritional Components Amino acids (using 15% Clinisol): 102 g Dextrose 19% = 266 g Lipids (using  20% SMOFlipids): 50.4 g kCal: 1817/24h  Electrolytes in TPN (standard): Na 80mq/L, K 560m/L, Ca 27m86mL, Mg 10 mEq/L (max recommended), Phos 10 mmol/L. Cl:Ac 1:1 Add MVI to TPN  Continue trace elements and chromium per discussion with RD. Discontinued Zinc  Continue CBG/SSI for Cyclic TPN: SSI  -CBG 2 hrs after cyclic TPN start -CBG during middle of cyclic TPN infusion -CBG 1 hr after cylic TPN stopped -CBG while off TPN (=4 CBGs per 24 hr period) + 10u insulin in TPN Check TPN labs on Mon/Thurs at minimum Monitor for further need of diuresis and adjust as needed for fluid status Lasix 20 mg PO BID added 10/13   Jospeh Mangel A Tabb Croghan Clinical Pharmacist 06/29/2022 8:41 AM

## 2022-06-29 NOTE — Progress Notes (Signed)
Pt seen and examined. In good spirits No new issues EC fistula contained, TPN Ambulating , taking PO   PE NAD Abd: eakin pouch w/o leaks   A/P Continue current supportive care

## 2022-06-30 LAB — COMPREHENSIVE METABOLIC PANEL
ALT: 18 U/L (ref 0–44)
AST: 31 U/L (ref 15–41)
Albumin: 2.8 g/dL — ABNORMAL LOW (ref 3.5–5.0)
Alkaline Phosphatase: 114 U/L (ref 38–126)
Anion gap: 6 (ref 5–15)
BUN: 24 mg/dL — ABNORMAL HIGH (ref 6–20)
CO2: 29 mmol/L (ref 22–32)
Calcium: 8.3 mg/dL — ABNORMAL LOW (ref 8.9–10.3)
Chloride: 101 mmol/L (ref 98–111)
Creatinine, Ser: 0.53 mg/dL (ref 0.44–1.00)
GFR, Estimated: >60 mL/min (ref >60)
Glucose, Bld: 172 mg/dL — ABNORMAL HIGH (ref 70–99)
Potassium: 3 mmol/L — ABNORMAL LOW (ref 3.5–5.1)
Sodium: 136 mmol/L (ref 135–145)
Total Bilirubin: 0.5 mg/dL (ref 0.3–1.2)
Total Protein: 6.2 g/dL — ABNORMAL LOW (ref 6.5–8.1)

## 2022-06-30 LAB — GLUCOSE, CAPILLARY
Glucose-Capillary: 147 mg/dL — ABNORMAL HIGH (ref 70–99)
Glucose-Capillary: 162 mg/dL — ABNORMAL HIGH (ref 70–99)
Glucose-Capillary: 148 mg/dL — ABNORMAL HIGH (ref 70–99)
Glucose-Capillary: 177 mg/dL — ABNORMAL HIGH (ref 70–99)
Glucose-Capillary: 90 mg/dL (ref 70–99)
Glucose-Capillary: 84 mg/dL (ref 70–99)
Glucose-Capillary: 98 mg/dL (ref 70–99)
Glucose-Capillary: 174 mg/dL — ABNORMAL HIGH (ref 70–99)
Glucose-Capillary: 159 mg/dL — ABNORMAL HIGH (ref 70–99)
Glucose-Capillary: 188 mg/dL — ABNORMAL HIGH (ref 70–99)
Glucose-Capillary: 177 mg/dL — ABNORMAL HIGH (ref 70–99)
Glucose-Capillary: 93 mg/dL (ref 70–99)
Glucose-Capillary: 178 mg/dL — ABNORMAL HIGH (ref 70–99)
Glucose-Capillary: 170 mg/dL — ABNORMAL HIGH (ref 70–99)
Glucose-Capillary: 179 mg/dL — ABNORMAL HIGH (ref 70–99)
Glucose-Capillary: 117 mg/dL — ABNORMAL HIGH (ref 70–99)
Glucose-Capillary: 146 mg/dL — ABNORMAL HIGH (ref 70–99)

## 2022-06-30 LAB — PHOSPHORUS: Phosphorus: 3 mg/dL (ref 2.5–4.6)

## 2022-06-30 LAB — MAGNESIUM: Magnesium: 1.9 mg/dL (ref 1.7–2.4)

## 2022-06-30 MED ORDER — VITAMIN D (ERGOCALCIFEROL) 1.25 MG (50000 UNIT) PO CAPS
50000.0000 [IU] | ORAL_CAPSULE | ORAL | Status: DC
  Administered 2022-07-01 – 2022-12-16 (×24): 50000 [IU] via ORAL
  Filled 2022-06-30 (×25): qty 1

## 2022-06-30 MED ORDER — POTASSIUM CHLORIDE CRYS ER 20 MEQ PO TBCR
40.0000 meq | EXTENDED_RELEASE_TABLET | ORAL | Status: AC
  Administered 2022-06-30 (×2): 40 meq via ORAL
  Filled 2022-06-30 (×2): qty 2

## 2022-06-30 MED ORDER — TRACE MINERALS CU-MN-SE-ZN 300-55-60-3000 MCG/ML IV SOLN
INTRAVENOUS | Status: AC
  Filled 2022-06-30: qty 681.33

## 2022-06-30 NOTE — Progress Notes (Signed)
Pt seen and examined. Ambulating No new issues Having breakfast No pain  No bleeding 350 cc recorded Labs ok low k   PE NAD Abd: eakin pouch w/o leaks   A/P Continue current supportive care Replace lytes

## 2022-06-30 NOTE — Progress Notes (Signed)
PHARMACY - TOTAL PARENTERAL NUTRITION CONSULT NOTE   Indication: Prolonged ileus  Patient Measurements: Height: '4\' 11"'$  (149.9 cm) Weight: 97.2 kg (214 lb 4.6 oz) IBW/kg (Calculated) : 43.2 TPN AdjBW (KG): 55.2 Body mass index is 43.28 kg/m.  Assessment: Debra [name] is a 56 y.o. female s/p laparotomy, excision of greater omental mass, abdominal wall reconstruction with [name], appendectomy, and placement of Prevena vac.  Glucose / Insulin: No apparent history of diabetes. BG 120 - 178 (SSI 4x/day + 10u insulin in TPN):    SSI: 13 units /24 hrs Electrolytes:  Renal: Scr stable at apparent baseline  Hepatic: No transaminitis. LFTs within normal limits. TG within normal limits GI Imaging: 9/11 CTAP: no new acute issues GI Surgeries / Procedures: s/p laparotomy, excision of greater omental mass, abdominal wall reconstruction with [name], appendectomy, and placement of Prevena vac  Central access: 02/15/22 TPN start date: 02/15/22  Nutritional Goals: Goal cyclic TPN over 16 hrs: cyclic TPN over 16 hrs: (provides 102 g of protein and 1817 kcals per day) total volume over 16hrs for calculations=1400 ml(1500 ml total with overfill)  RD Assessment:  Estimated Needs Total Energy Estimated Needs: 1800-2100kcal/day Total Protein Estimated Needs: 90-110g/d Total Fluid Estimated Needs: 1.4-1.6L/day  Current Nutrition:  Soft diet + nutritional supplements, not meeting PO needs yet   Plan: Transitioned to *Cyclic* TPN on 7/49.   to run over 16 hours per MD request. [name] at 2000 per discussion with Dietician to allow patient time for walking in afternoon/evenings.  To run over 16 hours: -Start rate at 49 mL/hr for 1 hour. -Increase rate to 98 mL/hr for 13 hours.  -Decrease rate to 49 mL/hr for 1 hour. -Decrease rate to 25 mL/hr for 1 hours, then stop.  Nutritional Components Amino acids (using 15% Clinisol): 102 g Dextrose 19% = 266 g Lipids  (using 20% SMOFlipids): 50.4 g kCal: 1817/24h  Electrolytes in TPN (standard): Na 76mq/L, K 529m/L, Ca 2m80mL, Mg 10 mEq/L (max recommended), Phos 10 mmol/L. Cl:Ac 1:1 Add MVI to TPN  Continue trace elements and chromium per discussion with RD. Discontinued Zinc  Continue CBG/SSI for Cyclic TPN: SSI  -CBG 2 hrs after cyclic TPN start -CBG during middle of cyclic TPN infusion -CBG 1 hr after cylic TPN stopped -CBG while off TPN (=4 CBGs per 24 hr period) + 10u insulin in TPN Check TPN labs on Mon/Thurs at minimum K 3: replace with 13m44mO q4h x2 doses. Monitor for further need of diuresis and adjust as needed for fluid status Lasix 20 mg PO BID added 10/13   BranLorna Dibblenical Pharmacist 06/30/2022 9:48 AM

## 2022-07-01 LAB — GLUCOSE, CAPILLARY
Glucose-Capillary: 163 mg/dL — ABNORMAL HIGH (ref 70–99)
Glucose-Capillary: 141 mg/dL — ABNORMAL HIGH (ref 70–99)
Glucose-Capillary: 121 mg/dL — ABNORMAL HIGH (ref 70–99)
Glucose-Capillary: 144 mg/dL — ABNORMAL HIGH (ref 70–99)

## 2022-07-01 MED ORDER — TRACE MINERALS CU-MN-SE-ZN 300-55-60-3000 MCG/ML IV SOLN
INTRAVENOUS | Status: AC
  Filled 2022-07-01: qty 681.33

## 2022-07-01 MED ORDER — VITAMIN A 15 MG/ML IM SOLN
50000.0000 [IU] | Freq: Every day | INTRAMUSCULAR | Status: AC
  Administered 2022-07-04 – 2022-07-10 (×6): 50000 [IU] via INTRAMUSCULAR
  Filled 2022-07-01 (×8): qty 2

## 2022-07-01 MED ORDER — VITAMIN A 50000 UNIT/ML IM SOLN
100000.0000 [IU] | Freq: Every day | INTRAMUSCULAR | Status: AC
  Administered 2022-07-01 – 2022-07-03 (×3): 100000 [IU] via INTRAMUSCULAR
  Filled 2022-07-01 (×3): qty 2
  Filled 2022-07-01: qty 4
  Filled 2022-07-01: qty 2

## 2022-07-01 NOTE — Progress Notes (Signed)
[facility] SURGICAL PROGRESS NOTE (cpt [phone])  Hospital Day(s): 138.   Post op day(s): 136 Days Post-Op.   Interval History:  Patient seen and examined [name] intact; RN staff managing effluent with intermittent suction No new labs  She is on soft diet; on cyclic TPN Ambulating independently   Review of Systems:  Constitutional: denies fever, chills  HEENT: denies cough or congestion  Respiratory: denies any shortness of breath  Cardiovascular: denies chest pain or palpitations  Gastrointestinal: denies abdominal pain, N/V Genitourinary: denies burning with urination or urinary frequency Integumentary: + midline wound  Vital signs in last 24 hours: [min-max] current  Temp:  [98.1 F (36.7 C)-98.8 F (37.1 C)] 98.6 F (37 C) (10/23 2236) Pulse Rate:  [78-80] 78 (10/23 2236) Resp:  [16-18] 16 (10/23 2236) BP: (103-109)/(51-55) 109/51 (10/23 2236) SpO2:  [96 %-98 %] 98 % (10/23 2236) Weight:  [96.3 kg] 96.3 kg (10/24 0500)     Height: '4\' 11"'$  (149.9 cm) Weight: 96.3 kg BMI (Calculated): 43.04   Intake/Output last 2 shifts:  10/23 0701 - 10/24 0700 In: 462.6 [I.V.:462.6] Out: 150 [Drains:150]   Physical Exam:  Constitutional: alert, cooperative and no distress  Respiratory: breathing non-labored at rest  Cardiovascular: regular rate and sinus rhythm  Gastrointestinal: Soft, abdominal soreness on the right, non-distended, no rebound/guarding. Integumentary: Midline wound open, peritoneum closed; granulating;  there are three stomatized areas visible in the LUQ and LLQ portions of the wound, output remains feculent. There is hypergranulation tissue to the inferior wound edge.   Labs:     Latest Ref Rng & Units 06/17/2022    5:03 AM 06/16/2022    8:33 AM 05/29/2022    7:17 PM  CBC  WBC 4.0 - 10.5 K/uL 2.7  2.5    Hemoglobin 12.0 - 15.0 g/dL 8.1  6.8  9.3   Hematocrit 36.0 - 46.0 % 25.6  21.8  30.3   Platelets 150 - 400 K/uL 69  65        Latest Ref  Rng & Units 06/30/2022    5:30 AM 06/26/2022    6:45 AM 06/25/2022    5:22 AM  CMP  Glucose 70 - 99 mg/dL 172  169    BUN 6 - 20 mg/dL 24  23    Creatinine 0.44 - 1.00 mg/dL 0.53  0.57    Sodium 135 - 145 mmol/L 136  136    Potassium 3.5 - 5.1 mmol/L 3.0  3.7  3.3   Chloride 98 - 111 mmol/L 101  103    CO2 22 - 32 mmol/L 29  26    Calcium 8.9 - 10.3 mg/dL 8.3  8.3    Total Protein 6.5 - 8.1 g/dL 6.2  6.2    Total Bilirubin 0.3 - 1.2 mg/dL 0.5  0.6    Alkaline Phos 38 - 126 U/L 114  107    AST 15 - 41 U/L 31  39    ALT 0 - 44 U/L 18  17      Imaging studies: No new pertinent imaging studies   Assessment/Plan:  56 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   - New 10/24: No issues   - Wound Care: [name] pouch changed 10/16 secondary to to leak. Back to [facility]  pouch with "window" but not on intermittent or continuous suction. Patient seems to like this better. She will need intermittent suction with Yankauer to control effluent. Likely be beneficial to suction before ambulation.    - On Soft diet + nutritional supplementation - Continue TPN; transition to QHS - Appreciate dietary assistance with trace elements; iron supplementation            - Monitor abdominal examination; on-going bowel function            - Pain control prn; antemetic prn            - Out of bed; discharged from therapies; no recommendations at this time   - Discharge Planning: Anticipate lengthy admission and potential eventual transfer; no new progress    All of the above findings and recommendations were discussed with the patient, and the medical team, and all of patient's questions were answered to her expressed satisfaction.  -- Debra Simon, PA-C [facility] 07/01/2022, 7:10 AM M-F: 7am - 4pm

## 2022-07-01 NOTE — Progress Notes (Signed)
Mobility Specialist - Progress Note    07/01/22 1147  Mobility  Activity Ambulated independently in hallway  Level of Assistance Independent  Assistive Device None  Distance Ambulated (ft) 1000 ft  Activity Response Tolerated well  Mobility Referral Yes  $Mobility charge 1 Mobility   Candie Mile Mobility Specialist 07/01/22 11:47 AM

## 2022-07-01 NOTE — Progress Notes (Signed)
PHARMACY - TOTAL PARENTERAL NUTRITION CONSULT NOTE   Indication: Prolonged ileus  Patient Measurements: Height: '4\' 11"'$  (149.9 cm) Weight: 96.3 kg (212 lb 4.9 oz) IBW/kg (Calculated) : 43.2 TPN AdjBW (KG): 55.2 Body mass index is 42.88 kg/m.  Assessment: Debra [name] is a 56 y.o. female s/p laparotomy, excision of greater omental mass, abdominal wall reconstruction with [name], appendectomy, and placement of Prevena vac.  Glucose / Insulin: No apparent history of diabetes. BG 146 - 179 (SSI 4x/day + 10u insulin in TPN):    SSI: 13 units /24 hrs Electrolytes:  Renal: Scr stable at apparent baseline  Hepatic: No transaminitis. LFTs within normal limits. TG within normal limits GI Imaging: 9/11 CTAP: no new acute issues GI Surgeries / Procedures: s/p laparotomy, excision of greater omental mass, abdominal wall reconstruction with [name], appendectomy, and placement of Prevena vac  Central access: 02/15/22 TPN start date: 02/15/22  Nutritional Goals: Goal cyclic TPN over 16 hrs: cyclic TPN over 16 hrs: (provides 102 g of protein and 1817 kcals per day) total volume over 16hrs for calculations=1400 ml(1500 ml total with overfill)  RD Assessment:  Estimated Needs Total Energy Estimated Needs: 1800-2100kcal/day Total Protein Estimated Needs: 90-110g/d Total Fluid Estimated Needs: 1.4-1.6L/day  Current Nutrition:  Soft diet + nutritional supplements, not meeting PO needs yet   Plan: Transitioned to *Cyclic* TPN on 9/35.   to run over 16 hours per MD request. [name] at 2000 per discussion with Dietician to allow patient time for walking in afternoon/evenings.  To run over 16 hours: -Start rate at 49 mL/hr for 1 hour. -Increase rate to 98 mL/hr for 13 hours.  -Decrease rate to 49 mL/hr for 1 hour. -Decrease rate to 25 mL/hr for 1 hours, then stop.  Nutritional Components Amino acids (using 15% Clinisol): 102 g Dextrose 19% = 266 g Lipids  (using 20% SMOFlipids): 50.4 g kCal: 1817/24h  Electrolytes in TPN (standard): Na 1mq/L, K 547m/L, Ca 67m60mL, Mg 10 mEq/L (max recommended), Phos 10 mmol/L. Cl:Ac 1:1 Add MVI to TPN  Continue trace elements and chromium per discussion with RD. Discontinued Zinc  Continue CBG/SSI for Cyclic TPN: SSI  -CBG 2 hrs after cyclic TPN start -CBG during middle of cyclic TPN infusion -CBG 1 hr after cylic TPN stopped -CBG while off TPN (=4 CBGs per 24 hr period) + 10u insulin in TPN Check TPN labs on Mon/Thurs at minimum Will add on BMP, Mg, & Phos for 10/25 AM. Monitor for further need of diuresis and adjust as needed for fluid status Lasix 20 mg PO BID added 10/13   BraLorna Dibbleinical Pharmacist 07/01/2022 8:33 AM

## 2022-07-01 NOTE — Progress Notes (Signed)
Nutrition Follow-up  DOCUMENTATION CODES:   Obesity unspecified  INTERVENTION:   Continue cyclic TPN per pharmacy (16 hrs)- provides 1817kcal/day and 102g/day protein   Ensure Enlive po TID, each supplement provides 350 kcal and 20 grams of protein.  MVI, chromium and trace elements in TPN   Daily weights   Vitamin D3- Increase to 50,000 units po weekly   Vitamin A- 100,000 units IM daily x 3 days, followed by 50,000 units IM daily x 7 days.   Check manganese, selenium, zinc and copper labs 11/2.   NUTRITION DIAGNOSIS:   Increased nutrient needs related to wound healing, catabolic illness as evidenced by estimated needs.  GOAL:   Patient will meet greater than or equal to 90% of their needs -met with TPN   MONITOR:   PO intake, Supplement acceptance, Labs, Weight trends, Diet advancement, I & O's, TPN  ASSESSMENT:   56 y/o female with h/o hypothyroidism, COVID 19 (3/21), kidney stones and stage 3 colon cancer (s/p left hemicolectomy 5/21 and chemotherapy) who is admitted for new pelvic mass now s/p laparotomy 6/8 (with excision of pelvic mass from greater omentum, abdominal wall reconstruction with bilateral myocutaneous flaps and mesh, incisional hernia repair, appendectomy repair and VAC placement) complicated by bowel perforation s/p reopening of recent laparotomy 6/10 (with repair of small bowel perforation, excision of mesh, placement of two phasix mesh and VAC placement). Pathology returned as metastatic adenocarcinoma.   Pt continues to tolerate TPN well at goal rate; TPN is currently being cycled for 16 hours. Triglycerides wnl and are being checked monthly. Hyperglycemia resolved with insulin in TPN. Volume status improving with diuresis; pt remains up ~15lbs from admission. Vitamin A and D labs returned lower after high dose oral supplementation; RD highly suspects pt with malabsorption. Will increase oral vitamin D supplementation and supplement vitamin A  parenterally. Will follow up with trace element labs on 11/2. Pt continues to have poor oral intake. Pt is drinking some Ensure. [name] pouch remains in place with intermittent suction.    Medications reviewed and include: D3, lasix, insulin, L-glutamine, synthroid, imodium, protonix, carafate, vitamin A  -Selenium 81(L), Chromium 1.4 wnl, Manganese 26.3(H), Iodine 57.9 wnl, copper 97 wnl, zinc 90 wnl- 10/2 -Iron 31.0 wnl, TIBC 360, ferritin 33 wnl, transferrin 272, folate 10.5 wnl, B12 291- 10/16 -Vitamin B1- 117.2, vitamin D 19.46(L), vitamin A 14.3(L), B6 11.9 wnl, vitamin C 0.8, vitamin E 13.4 wnl, vitamin K 0.64 wnl- 10/16  Labs reviewed: Na 136 wnl, K 3.0(L), BUN 24(H), P 3.0 wnl, Mg 1.9 wnl- 10/23 Triglycerides- 98-10/16 Cbgs- 141, 163 x 24 hrs  Diet Order:    Diet Order             DIET SOFT Room service appropriate? Yes; Fluid consistency: Thin  Diet effective now                  EDUCATION NEEDS:   Not appropriate for education at this time  Skin:  Skin Assessment: Reviewed RN Assessment ( Midline wound: 15cm x 10cm x 6cm )  Last BM:  10/23- TYPE 6  Height:   Ht Readings from Last 1 Encounters:  03/01/22 '4\' 11"'  (1.499 m)    Weight:   Wt Readings from Last 1 Encounters:  07/01/22 96.3 kg    Ideal Body Weight:  44.3 kg  BMI:  Body mass index is 42.88 kg/m.  Estimated Nutritional Needs:   Kcal:  1800-2100kcal/day  Protein:  90-110g/d  Fluid:  1.4-1.6L/day  Myriam Jacobson  Megan Salon MS, RD, LDN Please refer to [facility] for RD and/or RD on-call/weekend/after hours pager

## 2022-07-02 ENCOUNTER — Encounter: Payer: Self-pay | Admitting: Oncology

## 2022-07-02 LAB — CBC WITH DIFFERENTIAL/PLATELET
HCT: 32.6 % — ABNORMAL LOW (ref 36.0–46.0)
Hemoglobin: 10.2 g/dL — ABNORMAL LOW (ref 12.0–15.0)
MCH: 27.3 pg (ref 26.0–34.0)
MCHC: 31.3 g/dL (ref 30.0–36.0)
MCV: 87.2 fL (ref 80.0–100.0)
Platelets: 84 10*3/uL — ABNORMAL LOW (ref 150–400)
RBC: 3.74 MIL/uL — ABNORMAL LOW (ref 3.87–5.11)
RDW: 22.3 % — ABNORMAL HIGH (ref 11.5–15.5)
WBC: 3.7 10*3/uL — ABNORMAL LOW (ref 4.0–10.5)
nRBC: 0 % (ref 0.0–0.2)

## 2022-07-02 LAB — BASIC METABOLIC PANEL
Anion gap: 6 (ref 5–15)
BUN: 26 mg/dL — ABNORMAL HIGH (ref 6–20)
CO2: 29 mmol/L (ref 22–32)
Calcium: 8.8 mg/dL — ABNORMAL LOW (ref 8.9–10.3)
Chloride: 102 mmol/L (ref 98–111)
Creatinine, Ser: 0.56 mg/dL (ref 0.44–1.00)
GFR, Estimated: >60 mL/min (ref >60)
Glucose, Bld: 162 mg/dL — ABNORMAL HIGH (ref 70–99)
Potassium: 3.7 mmol/L (ref 3.5–5.1)
Sodium: 137 mmol/L (ref 135–145)

## 2022-07-02 LAB — GLUCOSE, CAPILLARY
Glucose-Capillary: 152 mg/dL — ABNORMAL HIGH (ref 70–99)
Glucose-Capillary: 186 mg/dL — ABNORMAL HIGH (ref 70–99)
Glucose-Capillary: 148 mg/dL — ABNORMAL HIGH (ref 70–99)
Glucose-Capillary: 111 mg/dL — ABNORMAL HIGH (ref 70–99)
Glucose-Capillary: 152 mg/dL — ABNORMAL HIGH (ref 70–99)

## 2022-07-02 LAB — PHOSPHORUS: Phosphorus: 3 mg/dL (ref 2.5–4.6)

## 2022-07-02 LAB — MAGNESIUM: Magnesium: 2.1 mg/dL (ref 1.7–2.4)

## 2022-07-02 MED ORDER — TRACE MINERALS CU-MN-SE-ZN 300-55-60-3000 MCG/ML IV SOLN
INTRAVENOUS | Status: AC
  Filled 2022-07-02: qty 681.33

## 2022-07-02 NOTE — Progress Notes (Signed)
Mobility Specialist - Progress Note   07/02/22 1514  Mobility  Activity Ambulated independently in hallway  Level of [place] None  Distance Ambulated (ft) 1000 ft  Activity Response Tolerated well  Mobility Referral Yes  $Mobility charge 1 Mobility   Candie Mile Mobility Specialist 07/02/22 3:15 PM

## 2022-07-02 NOTE — Progress Notes (Signed)
[facility] SURGICAL PROGRESS NOTE (cpt [phone])  Hospital Day(s): 139.   Post op day(s): 137 Days Post-Op.   Interval History:  Patient seen and examined [name] intact; RN staff managing effluent with intermittent suction No new labs  She is on soft diet; on cyclic TPN Ambulating independently   Review of Systems:  Constitutional: denies fever, chills  HEENT: denies cough or congestion  Respiratory: denies any shortness of breath  Cardiovascular: denies chest pain or palpitations  Gastrointestinal: denies abdominal pain, N/V Genitourinary: denies burning with urination or urinary frequency Integumentary: + midline wound  Vital signs in last 24 hours: [min-max] current  Temp:  [98 F (36.7 C)-98.3 F (36.8 C)] 98.3 F (36.8 C) (10/24 1922) Pulse Rate:  [72-73] 72 (10/24 1922) Resp:  [12-20] 20 (10/24 1922) BP: (104-112)/(57-63) 104/63 (10/24 1922) SpO2:  [96 %-99 %] 96 % (10/24 1922)     Height: '4\' 11"'$  (149.9 cm) Weight: 96.3 kg BMI (Calculated): 43.04   Intake/Output last 2 shifts:  No intake/output data recorded.   Physical Exam:  Constitutional: alert, cooperative and no distress  Respiratory: breathing non-labored at rest  Cardiovascular: regular rate and sinus rhythm  Gastrointestinal: Soft, abdominal soreness on the right, non-distended, no rebound/guarding. Integumentary: Midline wound open, peritoneum closed; granulating;  there are three stomatized areas visible in the LUQ and LLQ portions of the wound, output remains feculent. There is hypergranulation tissue to the inferior wound edge.   Labs:     Latest Ref Rng & Units 06/17/2022    5:03 AM 06/16/2022    8:33 AM 05/29/2022    7:17 PM  CBC  WBC 4.0 - 10.5 K/uL 2.7  2.5    Hemoglobin 12.0 - 15.0 g/dL 8.1  6.8  9.3   Hematocrit 36.0 - 46.0 % 25.6  21.8  30.3   Platelets 150 - 400 K/uL 69  65        Latest Ref Rng & Units 06/30/2022    5:30 AM 06/26/2022    6:45 AM 06/25/2022    5:22 AM   CMP  Glucose 70 - 99 mg/dL 172  169    BUN 6 - 20 mg/dL 24  23    Creatinine 0.44 - 1.00 mg/dL 0.53  0.57    Sodium 135 - 145 mmol/L 136  136    Potassium 3.5 - 5.1 mmol/L 3.0  3.7  3.3   Chloride 98 - 111 mmol/L 101  103    CO2 22 - 32 mmol/L 29  26    Calcium 8.9 - 10.3 mg/dL 8.3  8.3    Total Protein 6.5 - 8.1 g/dL 6.2  6.2    Total Bilirubin 0.3 - 1.2 mg/dL 0.5  0.6    Alkaline Phos 38 - 126 U/L 114  107    AST 15 - 41 U/L 31  39    ALT 0 - 44 U/L 18  17      Imaging studies: No new pertinent imaging studies   Assessment/Plan:  56 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   - New 10/25: No issues   - Wound Care: [name] pouch changed 10/16 secondary to to leak. Back to [name] pouch with "window" but not on intermittent or continuous suction. Patient seems to like this  better. She will need intermittent suction with Yankauer to control effluent. Likely be beneficial to suction before ambulation.    - On Soft diet + nutritional supplementation - Continue TPN; transition to QHS - Appreciate dietary assistance with trace elements; iron supplementation            - Monitor abdominal examination; on-going bowel function            - Pain control prn; antemetic prn            - Out of bed; discharged from therapies; no recommendations at this time   - Discharge Planning: Anticipate lengthy admission and potential eventual transfer; no new progress    All of the above findings and recommendations were discussed with the patient, and the medical team, and all of patient's questions were answered to her expressed satisfaction.  -- [name], PA-C [facility] 07/02/2022, 7:26 AM M-F: 7am - 4pm

## 2022-07-02 NOTE — Progress Notes (Signed)
PHARMACY - TOTAL PARENTERAL NUTRITION CONSULT NOTE   Indication: Prolonged ileus  Patient Measurements: Height: '4\' 11"'$  (149.9 cm) Weight: 96.3 kg (212 lb 4.9 oz) IBW/kg (Calculated) : 43.2 TPN AdjBW (KG): 55.2 Body mass index is 42.88 kg/m.  Assessment: Debra [name] is a 56 y.o. female s/p laparotomy, excision of greater omental mass, abdominal wall reconstruction with [name], appendectomy, and placement of Prevena vac.  Glucose / Insulin: No apparent history of diabetes. BG 146 - 179 (SSI 4x/day + 10u insulin in TPN):    SSI: 13 units /24 hrs Electrolytes: Labs returned WNL after last repletion. CTM Renal: Scr stable at apparent baseline  Hepatic: No transaminitis. LFTs within normal limits. TG within normal limits GI Imaging: 9/11 CTAP: no new acute issues GI Surgeries / Procedures: s/p laparotomy, excision of greater omental mass, abdominal wall reconstruction with [name], appendectomy, and placement of Prevena vac  Central access: 02/15/22 TPN start date: 02/15/22  Nutritional Goals: Goal cyclic TPN over 16 hrs: cyclic TPN over 16 hrs: (provides 102 g of protein and 1817 kcals per day) total volume over 16hrs for calculations=1400 ml(1500 ml total with overfill)  RD Assessment:  Estimated Needs Total Energy Estimated Needs: 1800-2100kcal/day Total Protein Estimated Needs: 90-110g/d Total Fluid Estimated Needs: 1.4-1.6L/day  Current Nutrition:  Soft diet + nutritional supplements, not meeting PO needs yet   Plan: Transitioned to *Cyclic* TPN on 9/37.   to run over 16 hours per MD request. [name] at 2000 per discussion with Dietician to allow patient time for walking in afternoon/evenings.  To run over 16 hours: -Start rate at 49 mL/hr for 1 hour. -Increase rate to 98 mL/hr for 13 hours.  -Decrease rate to 49 mL/hr for 1 hour. -Decrease rate to 25 mL/hr for 1 hours, then stop.  Nutritional Components Amino acids (using 15%  Clinisol): 102 g Dextrose 19% = 266 g Lipids (using 20% SMOFlipids): 50.4 g kCal: 1817/24h  Electrolytes in TPN (standard): Na 59mq/L, K 552m/L, Ca 42m31mL, Mg 10 mEq/L (max recommended), Phos 10 mmol/L. Cl:Ac 1:1 Add MVI to TPN  Continue trace elements and chromium per discussion with RD. Discontinued Zinc  Continue CBG/SSI for Cyclic TPN: SSI  -CBG 2 hrs after cyclic TPN start -CBG during middle of cyclic TPN infusion -CBG 1 hr after cylic TPN stopped -CBG while off TPN (=4 CBGs per 24 hr period) + 10u insulin in TPN Check TPN labs on Mon/Thurs at minimum Monitor for further need of diuresis and adjust as needed for fluid status Continues Lasix 20 mg PO BID added 10/13   BraLorna Dibbleinical Pharmacist 07/02/2022 9:53 AM

## 2022-07-02 NOTE — Consult Note (Signed)
[place] Nurse fistula consult note Fistula type/location: midline in base of open wound  Requested by staff to assess after changed per staff due to leaking.  Visited patient room twice patient was not in the room. Will attempt to check on patient 10/26  [place] nursing team will follow along for support with fistula care   [place], [place], [place]

## 2022-07-03 LAB — GLUCOSE, CAPILLARY
Glucose-Capillary: 138 mg/dL — ABNORMAL HIGH (ref 70–99)
Glucose-Capillary: 161 mg/dL — ABNORMAL HIGH (ref 70–99)
Glucose-Capillary: 163 mg/dL — ABNORMAL HIGH (ref 70–99)
Glucose-Capillary: 188 mg/dL — ABNORMAL HIGH (ref 70–99)
Glucose-Capillary: 193 mg/dL — ABNORMAL HIGH (ref 70–99)

## 2022-07-03 LAB — PHOSPHORUS: Phosphorus: 3.3 mg/dL (ref 2.5–4.6)

## 2022-07-03 LAB — COMPREHENSIVE METABOLIC PANEL
ALT: 24 U/L (ref 0–44)
AST: 39 U/L (ref 15–41)
Albumin: 2.9 g/dL — ABNORMAL LOW (ref 3.5–5.0)
Alkaline Phosphatase: 137 U/L — ABNORMAL HIGH (ref 38–126)
Anion gap: 6 (ref 5–15)
BUN: 26 mg/dL — ABNORMAL HIGH (ref 6–20)
CO2: 28 mmol/L (ref 22–32)
Calcium: 8.5 mg/dL — ABNORMAL LOW (ref 8.9–10.3)
Chloride: 102 mmol/L (ref 98–111)
Creatinine, Ser: 0.5 mg/dL (ref 0.44–1.00)
GFR, Estimated: >60 mL/min (ref >60)
Glucose, Bld: 180 mg/dL — ABNORMAL HIGH (ref 70–99)
Potassium: 3.6 mmol/L (ref 3.5–5.1)
Sodium: 136 mmol/L (ref 135–145)
Total Bilirubin: 0.7 mg/dL (ref 0.3–1.2)
Total Protein: 6.9 g/dL (ref 6.5–8.1)

## 2022-07-03 LAB — MAGNESIUM: Magnesium: 2 mg/dL (ref 1.7–2.4)

## 2022-07-03 MED ORDER — TRACE MINERALS CU-MN-SE-ZN 300-55-60-3000 MCG/ML IV SOLN
INTRAVENOUS | Status: AC
  Filled 2022-07-03: qty 681.33

## 2022-07-03 NOTE — Progress Notes (Signed)
PHARMACY - TOTAL PARENTERAL NUTRITION CONSULT NOTE   Indication: Prolonged ileus  Patient Measurements: Height: '4\' 11"'$  (149.9 cm) Weight: 96.3 kg (212 lb 4.9 oz) IBW/kg (Calculated) : 43.2 TPN AdjBW (KG): 55.2 Body mass index is 42.88 kg/m.  Assessment: Debra [name] is a 56 y.o. female s/p laparotomy, excision of greater omental mass, abdominal wall reconstruction with [name], appendectomy, and placement of Prevena vac.  Glucose / Insulin: No apparent history of diabetes. BG 148 - 193 (SSI 4x/day + 10u insulin in TPN):    SSI: 11 units /24 hrs Electrolytes: Labs WNL after last repletion. CTM Renal: Scr stable at apparent baseline  Hepatic: No transaminitis. LFTs within normal limits. TG within normal limits GI Imaging: 9/11 CTAP: no new acute issues GI Surgeries / Procedures: s/p laparotomy, excision of greater omental mass, abdominal wall reconstruction with [name], appendectomy, and placement of Prevena vac  Central access: 02/15/22 TPN start date: 02/15/22  Nutritional Goals: Goal cyclic TPN over 16 hrs: cyclic TPN over 16 hrs: (provides 102 g of protein and 1817 kcals per day) total volume over 16hrs for calculations=1400 ml(1500 ml total with overfill)  RD Assessment:  Estimated Needs Total Energy Estimated Needs: 1800-2100kcal/day Total Protein Estimated Needs: 90-110g/d Total Fluid Estimated Needs: 1.4-1.6L/day  Current Nutrition:  Soft diet + nutritional supplements, not meeting PO needs yet   Plan: Transitioned to *Cyclic* TPN on 4/96.   to run over 16 hours per MD request. [name] at 2000 per discussion with Dietician to allow patient time for walking in afternoon/evenings.  To run over 16 hours: -Start rate at 49 mL/hr for 1 hour. -Increase rate to 98 mL/hr for 13 hours.  -Decrease rate to 49 mL/hr for 1 hour. -Decrease rate to 25 mL/hr for 1 hours, then stop.  Nutritional Components Amino acids (using 15% Clinisol): 102  g Dextrose 19% = 266 g Lipids (using 20% SMOFlipids): 50.4 g kCal: 1817/24h  Electrolytes in TPN (standard): Na 39mq/L, K 570m/L, Ca 53m79mL, Mg 10 mEq/L (max recommended), Phos 10 mmol/L. Cl:Ac 1:1 Add MVI to TPN  Continue trace elements and chromium per discussion with RD. Discontinued Zinc  Continue CBG/SSI for Cyclic TPN: SSI  -CBG 2 hrs after cyclic TPN start -CBG during middle of cyclic TPN infusion -CBG 1 hr after cylic TPN stopped -CBG while off TPN (=4 CBGs per 24 hr period) + 10u insulin in TPN Check TPN labs on Mon/Thurs at minimum Monitor for further need of diuresis and adjust as needed for fluid status Continues Lasix 20 mg PO BID added 10/13   BraLorna Dibbleinical Pharmacist 07/03/2022 9:58 AM

## 2022-07-03 NOTE — Plan of Care (Signed)
  Problem: Nutrition: Goal: Adequate nutrition will be maintained Outcome: Progressing   Problem: Activity: Goal: Risk for activity intolerance will decrease Outcome: Progressing   Problem: Nutrition: Goal: Adequate nutrition will be maintained Outcome: Progressing   Problem: Pain Managment: Goal: General experience of comfort will improve Outcome: Progressing

## 2022-07-03 NOTE — TOC Progression Note (Signed)
Transition of Care [facility]) - Progression Note    Patient Details  Name: Debra [name] MRN: [id number] Date of Birth: 1966/05/18  Transition of [facility] CM/SW Contact  Beverly Sessions, RN Phone Number: 07/03/2022, 12:42 PM  Clinical Narrative:      Per surgery team no new update  Expected Discharge Plan: [place] Barriers to Discharge: Continued Medical Work up  Expected Discharge Plan and Services Expected Discharge Plan: [place]                                               Social Determinants of Health (SDOH) Interventions    Readmission Risk Interventions     No data to display

## 2022-07-03 NOTE — Progress Notes (Signed)
[facility] SUR[facility]ICAL PROGRESS NOTE (cpt [phone])  Hospital Day(s): 140.   Post op day(s): 138 Days Post-Op.   Interval History:  Patient seen and examined [name] intact; RN staff managing effluent with intermittent suction Nutritional labs are pending this morning  She is on soft diet; on cyclic TPN Ambulating independently   Review of Systems:  Constitutional: denies fever, chills  HEENT: denies cough or congestion  Respiratory: denies any shortness of breath  Cardiovascular: denies chest pain or palpitations  Gastrointestinal: denies abdominal pain, N/V Genitourinary: denies burning with urination or urinary frequency Integumentary: + midline wound  Vital signs in last 24 hours: [min-max] current  Temp:  [98.2 F (36.8 C)-98.7 F (37.1 C)] 98.4 F (36.9 C) (10/26 0529) Pulse Rate:  [73-82] 82 (10/26 0529) Resp:  [20] 20 (10/26 0529) BP: (112-129)/(57-85) 129/85 (10/26 0529) SpO2:  [96 %-100 %] 100 % (10/26 0529)     Height: '4\' 11"'$  (149.9 cm) Weight: 96.3 kg BMI (Calculated): 43.04   Intake/Output last 2 shifts:  10/25 0701 - 10/26 0700 In: 4665 [P.O.:100; I.V.:1389] Out: -    Physical Exam:  Constitutional: alert, cooperative and no distress  Respiratory: breathing non-labored at rest  Cardiovascular: regular rate and sinus rhythm  Gastrointestinal: Soft, abdominal soreness on the right, non-distended, no rebound/guarding. Integumentary: Midline wound open, peritoneum closed; granulating; there are three stomatized areas visible in the LUQ and LLQ portions of the wound, output remains feculent. There is hypergranulation tissue to the inferior wound edge.   Labs:     Latest Ref Rng & Units 07/02/2022   10:13 AM 06/17/2022    5:03 AM 06/16/2022    8:33 AM  CBC  WBC 4.0 - 10.5 K/uL 3.7  2.7  2.5   Hemoglobin 12.0 - 15.0 g/dL 10.2  8.1  6.8   Hematocrit 36.0 - 46.0 % 32.6  25.6  21.8   Platelets 150 - 400 K/uL 84  69  65       Latest Ref Rng &  Units 07/02/2022   10:13 AM 06/30/2022    5:30 AM 06/26/2022    6:45 AM  CMP  Glucose 70 - 99 mg/dL 162  172  169   BUN 6 - 20 mg/dL '26  24  23   '$ Creatinine 0.44 - 1.00 mg/dL 0.56  0.53  0.57   Sodium 135 - 145 mmol/L 137  136  136   Potassium 3.5 - 5.1 mmol/L 3.7  3.0  3.7   Chloride 98 - 111 mmol/L 102  101  103   CO2 22 - 32 mmol/L '29  29  26   '$ Calcium 8.9 - 10.3 mg/dL 8.8  8.3  8.3   Total Protein 6.5 - 8.1 g/dL  6.2  6.2   Total Bilirubin 0.3 - 1.2 mg/dL  0.5  0.6   Alkaline Phos 38 - 126 U/L  114  107   AST 15 - 41 U/L  31  39   ALT 0 - 44 U/L  18  17     Imaging studies: No new pertinent imaging studies   Assessment/Plan:  56 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   - New 10/26: No issues   - Wound Care: [name] pouch with "window" but not on intermittent or  continuous suction. Patient seems to like this better. She will need intermittent suction with Yankauer to control effluent. Likely be beneficial to suction before ambulation.    - On Soft diet + nutritional supplementation - Continue TPN; transition to QHS - Appreciate dietary assistance with trace elements; iron supplementation            - Monitor abdominal examination; on-going bowel function            - Pain control prn; antemetic prn            - Out of bed; discharged from therapies; no recommendations at this time   - Discharge Planning: Anticipate lengthy admission and potential eventual transfer; no new progress    All of the above findings and recommendations were discussed with the patient, and the medical team, and all of patient's questions were answered to her expressed satisfaction.  -- Debra Simon, PA-C [facility] 07/03/2022, 7:17 AM M-F: 7am - 4pm

## 2022-07-04 LAB — GLUCOSE, CAPILLARY
Glucose-Capillary: 192 mg/dL — ABNORMAL HIGH (ref 70–99)
Glucose-Capillary: 127 mg/dL — ABNORMAL HIGH (ref 70–99)
Glucose-Capillary: 146 mg/dL — ABNORMAL HIGH (ref 70–99)

## 2022-07-04 MED ORDER — TRACE MINERALS CU-MN-SE-ZN 300-55-60-3000 MCG/ML IV SOLN
INTRAVENOUS | Status: AC
  Filled 2022-07-04: qty 681.33

## 2022-07-04 MED ORDER — POTASSIUM CHLORIDE CRYS ER 20 MEQ PO TBCR
20.0000 meq | EXTENDED_RELEASE_TABLET | Freq: Once | ORAL | Status: AC
  Administered 2022-07-04: 20 meq via ORAL
  Filled 2022-07-04: qty 1

## 2022-07-04 NOTE — Progress Notes (Signed)
PHARMACY - TOTAL PARENTERAL NUTRITION CONSULT NOTE   Indication: Prolonged ileus  Patient Measurements: Height: '4\' 11"'$  (149.9 cm) Weight: 96.8 kg (213 lb 6.5 oz) IBW/kg (Calculated) : 43.2 TPN AdjBW (KG): 55.2 Body mass index is 43.1 kg/m.  Assessment: Debra [name] is a 56 y.o. female s/p laparotomy, excision of greater omental mass, abdominal wall reconstruction with [name], appendectomy, and placement of Prevena vac.  Glucose / Insulin: No apparent history of diabetes. BG 138 - 193 (SSI 4x/day + 10u insulin in TPN):    SSI: 8 units /24 hrs Electrolytes: CTM Renal: Scr stable at apparent baseline  Hepatic: No transaminitis. LFTs within normal limits. TG within normal limits GI Imaging: 9/11 CTAP: no new acute issues GI Surgeries / Procedures: s/p laparotomy, excision of greater omental mass, abdominal wall reconstruction with [name], appendectomy, and placement of Prevena vac  Central access: 02/15/22 TPN start date: 02/15/22  Nutritional Goals: Goal cyclic TPN over 16 hrs: cyclic TPN over 16 hrs: (provides 102 g of protein and 1817 kcals per day) total volume over 16hrs for calculations=1400 ml(1500 ml total with overfill)  RD Assessment:  Estimated Needs Total Energy Estimated Needs: 1800-2100kcal/day Total Protein Estimated Needs: 90-110g/d Total Fluid Estimated Needs: 1.4-1.6L/day  Current Nutrition:  Soft diet + nutritional supplements, not meeting PO needs yet  -Patient walks around in afternoon/evening  Plan: Transitioned to *Cyclic* TPN on 9/37.   to run over 16 hours per MD request. [name] at 2000 per discussion with Dietician to allow patient time for walking in afternoon/evenings.  To run over 16 hours: -Start rate at 49 mL/hr for 1 hour. -Increase rate to 98 mL/hr for 13 hours.  -Decrease rate to 49 mL/hr for 1 hour. -Decrease rate to 25 mL/hr for 1 hours, then stop.  Nutritional Components Amino acids (using 15%  Clinisol): 102 g Dextrose 19% = 266 g Lipids (using 20% SMOFlipids): 50.4 g kCal: 1817/24h  Electrolytes in TPN (standard): Na 53mq/L, K 521m/L, Ca 1m3mL, Mg 10 mEq/L (max recommended), Phos 10 mmol/L. Cl:Ac 1:1 K 3.6  (on lasix BID). Will order KCL 20 meq PO x 1 dose Add MVI to TPN  Continue trace elements and chromium per discussion with RD. Discontinued Zinc  Continue CBG/SSI for Cyclic TPN: SSI  -CBG 2 hrs after cyclic TPN start -CBG during middle of cyclic TPN infusion -CBG 1 hr after cylic TPN stopped -CBG while off TPN (=4 CBGs per 24 hr period) SSI moderate + 10u insulin in TPN Check TPN labs on Mon/Thurs at minimum Monitor for further need of diuresis and adjust as needed for fluid status Continues Lasix 20 mg PO BID added 10/13   Damira Kem A Clinical Pharmacist 07/04/2022 9:52 AM

## 2022-07-04 NOTE — Progress Notes (Signed)
[facility] SURGICAL PROGRESS NOTE (cpt [phone])  Hospital Day(s): 141.   Post op day(s): 139 Days Post-Op.   Interval History:  Patient seen and examined [name] intact; RN staff managing effluent with intermittent suction No new labs this morning She is on soft diet; on cyclic TPN Ambulating independently   Review of Systems:  Constitutional: denies fever, chills  HEENT: denies cough or congestion  Respiratory: denies any shortness of breath  Cardiovascular: denies chest pain or palpitations  Gastrointestinal: denies abdominal pain, N/V Genitourinary: denies burning with urination or urinary frequency Integumentary: + midline wound  Vital signs in last 24 hours: [min-max] current  Temp:  [97.5 F (36.4 C)-98.2 F (36.8 C)] 97.5 F (36.4 C) (10/27 0737) Pulse Rate:  [70-95] 70 (10/27 0737) Resp:  [17-20] 18 (10/27 0737) BP: (107-116)/(59-67) 113/59 (10/27 0737) SpO2:  [96 %-100 %] 96 % (10/27 0737) Weight:  [96.8 kg] 96.8 kg (10/27 0500)     Height: '4\' 11"'$  (149.9 cm) Weight: 96.8 kg BMI (Calculated): 43.04   Intake/Output last 2 shifts:  10/26 0701 - 10/27 0700 In: 1724.7 [P.O.:1057; I.V.:667.7] Out: 375 [Drains:100; Stool:275]   Physical Exam:  Constitutional: alert, cooperative and no distress  Respiratory: breathing non-labored at rest  Cardiovascular: regular rate and sinus rhythm  Gastrointestinal: Soft, abdominal soreness on the right, non-distended, no rebound/guarding. Integumentary: Midline wound open, peritoneum closed; granulating; there are three stomatized areas visible in the LUQ and LLQ portions of the wound, output remains feculent. There is hypergranulation tissue to the inferior wound edge.   Labs:     Latest Ref Rng & Units 07/02/2022   10:13 AM 06/17/2022    5:03 AM 06/16/2022    8:33 AM  CBC  WBC 4.0 - 10.5 K/uL 3.7  2.7  2.5   Hemoglobin 12.0 - 15.0 g/dL 10.2  8.1  6.8   Hematocrit 36.0 - 46.0 % 32.6  25.6  21.8   Platelets  150 - 400 K/uL 84  69  65       Latest Ref Rng & Units 07/03/2022    7:55 AM 07/02/2022   10:13 AM 06/30/2022    5:30 AM  CMP  Glucose 70 - 99 mg/dL 180  162  172   BUN 6 - 20 mg/dL '26  26  24   '$ Creatinine 0.44 - 1.00 mg/dL 0.50  0.56  0.53   Sodium 135 - 145 mmol/L 136  137  136   Potassium 3.5 - 5.1 mmol/L 3.6  3.7  3.0   Chloride 98 - 111 mmol/L 102  102  101   CO2 22 - 32 mmol/L '28  29  29   '$ Calcium 8.9 - 10.3 mg/dL 8.5  8.8  8.3   Total Protein 6.5 - 8.1 g/dL 6.9   6.2   Total Bilirubin 0.3 - 1.2 mg/dL 0.7   0.5   Alkaline Phos 38 - 126 U/L 137   114   AST 15 - 41 U/L 39   31   ALT 0 - 44 U/L 24   18     Imaging studies: No new pertinent imaging studies   Assessment/Plan:  56 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   - New 10/27: No issues   - Wound Care: MLJQG BEEFE  with "window" but not on intermittent or continuous suction. Patient seems to like this better. She will need intermittent suction with Yankauer to control effluent. Likely be beneficial to suction before ambulation.    - On Soft diet + nutritional supplementation - Continue TPN; transition to QHS - Appreciate dietary assistance with trace elements; iron supplementation            - Monitor abdominal examination; on-going bowel function            - Pain control prn; antemetic prn            - Out of bed; discharged from therapies; no recommendations at this time   - Discharge Planning: Anticipate lengthy admission and potential eventual transfer; no new progress    All of the above findings and recommendations were discussed with the patient, and the medical team, and all of patient's questions were answered to her expressed  satisfaction.  -- [name], PA-C [facility] 07/04/2022, 8:26 AM M-F: 7am - 4pm

## 2022-07-05 MED ORDER — TRACE MINERALS CU-MN-SE-ZN 300-55-60-3000 MCG/ML IV SOLN
INTRAVENOUS | Status: AC
  Filled 2022-07-05: qty 681.33

## 2022-07-05 NOTE — Plan of Care (Signed)
  Problem: Clinical Measurements: Goal: Will remain free from infection Outcome: Progressing   Problem: Nutrition: Goal: Adequate nutrition will be maintained Outcome: Progressing   Problem: Activity: Goal: Risk for activity intolerance will decrease Outcome: Progressing   Problem: Nutrition: Goal: Adequate nutrition will be maintained Outcome: Progressing   Problem: Coping: Goal: Level of anxiety will decrease Outcome: Progressing   Problem: Pain Managment: Goal: General experience of comfort will improve Outcome: Progressing

## 2022-07-05 NOTE — Plan of Care (Signed)
  Problem: Clinical Measurements: Goal: Will remain free from infection 07/05/2022 1428 by Jordan Likes, RN Outcome: Progressing 07/05/2022 1425 by Jordan Likes, RN Outcome: Progressing   Problem: Nutrition: Goal: Adequate nutrition will be maintained 07/05/2022 1428 by Jordan Likes, RN Outcome: Progressing 07/05/2022 1425 by Jordan Likes, RN Outcome: Progressing   Problem: Activity: Goal: Risk for activity intolerance will decrease 07/05/2022 1428 by Jordan Likes, RN Outcome: Progressing 07/05/2022 1425 by Jordan Likes, RN Outcome: Progressing   Problem: Coping: Goal: Level of anxiety will decrease 07/05/2022 1428 by Jordan Likes, RN Outcome: Progressing 07/05/2022 1425 by Jordan Likes, RN Outcome: Progressing   Problem: Pain Managment: Goal: General experience of comfort will improve 07/05/2022 1428 by Jordan Likes, RN Outcome: Progressing 07/05/2022 1425 by Jordan Likes, RN Outcome: Progressing

## 2022-07-05 NOTE — Progress Notes (Signed)
[facility] SUR[facility]ICAL PROGRESS NOTE (cpt [phone])  Hospital Day(s): 142.   Post op day(s): 140 Days Post-Op.   Interval History:  Patient seen and examined [name] intact; RN staff managing effluent with intermittent suction, however I believe some suctioning as cause some traumatic bleeding.  Nurse saved a clot on a 4 x 4 gauze forming.  There is no evidence of active bleeding at this moment.  We discussed judicious and careful bleeding of succus with additional care for her fragile healing tissues. No new labs this morning She is on soft diet; on cyclic TPN Ambulating independently   Review of Systems:  Constitutional: denies fever, chills  HEENT: denies cough or congestion  Respiratory: denies any shortness of breath  Cardiovascular: denies chest pain or palpitations  Gastrointestinal: denies abdominal pain, Nausea Genitourinary: denies burning with urination or urinary frequency Integumentary: + midline wound  Vital signs in last 24 hours: [min-max] current  Temp:  [97.5 F (36.4 C)-98.3 F (36.8 C)] 97.5 F (36.4 C) (10/28 0819) Pulse Rate:  [76-84] 80 (10/28 0819) Resp:  [17-20] 20 (10/28 0819) BP: (104-138)/(56-83) 112/62 (10/28 0819) SpO2:  [95 %-100 %] 97 % (10/28 0819) Weight:  [96.8 kg] 96.8 kg (10/28 0500)     Height: '4\' 11"'$  (149.9 cm) Weight: 96.8 kg BMI (Calculated): 43.04   Intake/Output last 2 shifts:  10/27 0701 - 10/28 0700 In: 1731.3 [P.O.:120; I.V.:1611.3] Out: 200 [Drains:200]   Physical Exam:  Constitutional: alert, cooperative and no distress  Respiratory: breathing non-labored at rest  Cardiovascular: regular rate and sinus rhythm  Gastrointestinal: Soft, abdominal soreness on the right, non-distended, no rebound/guarding. Integumentary: Midline wound open, peritoneum closed; granulating; there are three stomatized areas visible in the LUQ and LLQ portions of the wound, output remains feculent.  There is no evidence of blood in her  [name]'s pouch currently.   Labs:     Latest Ref Rng & Units 07/02/2022   10:13 AM 06/17/2022    5:03 AM 06/16/2022    8:33 AM  CBC  WBC 4.0 - 10.5 K/uL 3.7  2.7  2.5   Hemoglobin 12.0 - 15.0 g/dL 10.2  8.1  6.8   Hematocrit 36.0 - 46.0 % 32.6  25.6  21.8   Platelets 150 - 400 K/uL 84  69  65       Latest Ref Rng & Units 07/03/2022    7:55 AM 07/02/2022   10:13 AM 06/30/2022    5:30 AM  CMP  Glucose 70 - 99 mg/dL 180  162  172   BUN 6 - 20 mg/dL '26  26  24   '$ Creatinine 0.44 - 1.00 mg/dL 0.50  0.56  0.53   Sodium 135 - 145 mmol/L 136  137  136   Potassium 3.5 - 5.1 mmol/L 3.6  3.7  3.0   Chloride 98 - 111 mmol/L 102  102  101   CO2 22 - 32 mmol/L '28  29  29   '$ Calcium 8.9 - 10.3 mg/dL 8.5  8.8  8.3   Total Protein 6.5 - 8.1 g/dL 6.9   6.2   Total Bilirubin 0.3 - 1.2 mg/dL 0.7   0.5   Alkaline Phos 38 - 126 U/L 137   114   AST 15 - 41 U/L 39   31   ALT 0 - 44 U/L 24   18     Imaging studies: No new pertinent imaging studies   Assessment/Plan:  56 y.o. female with high output  enterocutaneous fistula 140 Days Post-Op s/p re-opening of laparotomy for repair of small bowel perforation following initial laparotomy, excision of greater omental mass, abdominal wall reconstruction with [name], appendectomy, and placement of Prevena vac on 06/08.   - New 10/28: No issues, spoke with nursing staff regarding challenges of keeping up with succus drainage, and avoiding traumatizing the soft tissues.   - Wound Care: [name] pouch with "window" but not on intermittent or continuous suction. Patient seems to like this better. She will need intermittent suction with Yankauer to control effluent. Likely be beneficial to suction before ambulation.    - On Soft diet + nutritional supplementation - Continue TPN; transition to QHS - Appreciate dietary assistance with trace elements; iron supplementation            - Monitor abdominal examination; on-going bowel function             - Pain control prn; antemetic prn            - Out of bed; discharged from therapies; no recommendations at this time   - Discharge Planning: Anticipate lengthy admission and potential eventual transfer; no new progress    All of the above findings and recommendations were discussed with the patient, and the medical team, and all of patient's questions were answered to her expressed satisfaction.  Ronny Bacon, M.D., [facility]  07/05/2022 ; 1:07 PM

## 2022-07-05 NOTE — Progress Notes (Signed)
PHARMACY - TOTAL PARENTERAL NUTRITION CONSULT NOTE   Indication: Prolonged ileus  Patient Measurements: Height: '4\' 11"'$  (149.9 cm) Weight: 96.8 kg (213 lb 6.5 oz) IBW/kg (Calculated) : 43.2 TPN AdjBW (KG): 55.2 Body mass index is 43.1 kg/m.  Assessment: Debra [name] is a 56 y.o. female s/p laparotomy, excision of greater omental mass, abdominal wall reconstruction with [name], appendectomy, and placement of Prevena vac.  Glucose / Insulin: No apparent history of diabetes. BG 138 - 193 (SSI 4x/day + 10u insulin in TPN):    SSI: 7 units /24 hrs Electrolytes: CTM Renal: Scr stable at apparent baseline  Hepatic: No transaminitis. LFTs within normal limits. TG within normal limits GI Imaging: 9/11 CTAP: no new acute issues GI Surgeries / Procedures: s/p laparotomy, excision of greater omental mass, abdominal wall reconstruction with [name], appendectomy, and placement of Prevena vac  Central access: 02/15/22 TPN start date: 02/15/22  Nutritional Goals: Goal cyclic TPN over 16 hrs: cyclic TPN over 16 hrs: (provides 102 g of protein and 1817 kcals per day) total volume over 16hrs for calculations=1400 ml(1500 ml total with overfill)  RD Assessment:  Estimated Needs Total Energy Estimated Needs: 1800-2100kcal/day Total Protein Estimated Needs: 90-110g/d Total Fluid Estimated Needs: 1.4-1.6L/day  Current Nutrition:  Soft diet + nutritional supplements, not meeting PO needs yet  -Patient walks around in afternoon/evening  Plan: Transitioned to *Cyclic* TPN on 6/06.   to run over 16 hours per MD request. [name] at 2000 per discussion with Dietician to allow patient time for walking in afternoon/evenings.  To run over 16 hours: -Start rate at 49 mL/hr for 1 hour. -Increase rate to 98 mL/hr for 13 hours.  -Decrease rate to 49 mL/hr for 1 hour. -Decrease rate to 25 mL/hr for 1 hours, then stop.  Nutritional Components Amino acids (using 15%  Clinisol): 102 g Dextrose 19% = 266 g Lipids (using 20% SMOFlipids): 50.4 g kCal: 1817/24h  Electrolytes in TPN (standard): Na 80mq/L, K 550m/L, Ca 66m69mL, Mg 10 mEq/L (max recommended), Phos 10 mmol/L. Cl:Ac 1:1 K 3.6  (on lasix BID). Will order KCL 20 meq PO x 1 dose Add MVI to TPN  Continue trace elements and chromium per discussion with RD. Discontinued Zinc  Continue CBG/SSI for Cyclic TPN: SSI  -CBG 2 hrs after cyclic TPN start -CBG during middle of cyclic TPN infusion -CBG 1 hr after cylic TPN stopped -CBG while off TPN (=4 CBGs per 24 hr period) SSI moderate + 10u insulin in TPN Check TPN labs on Mon/Thurs at minimum Monitor for further need of diuresis and adjust as needed for fluid status Continues Lasix 20 mg PO BID added 10/13   KisMapleviewarmacist 07/05/2022 9:35 AM

## 2022-07-05 NOTE — Progress Notes (Signed)
Nurse went to suction inside of [name] pouch discharge noted to be bloody with a large blood clot occluding end of suction, clot removed from end of suction and was a little larger than a silver dollar. All discharge suctioned from pouch noted to be bloody in bright and dark red. Dr. Dahlia Byes notified and ordered that suction be stopped and alerted nurse that Dr. Luther Bradley is covering this patient for him today. Dr. Luther Bradley paged. Per night shift report patient has been having bloody discharge from suctioning and providers are aware. This nurse was unable to locate provider note that gave regard to patient having bleeding from open wound with clots, so provider notified.

## 2022-07-06 LAB — GLUCOSE, CAPILLARY
Glucose-Capillary: 106 mg/dL — ABNORMAL HIGH (ref 70–99)
Glucose-Capillary: 172 mg/dL — ABNORMAL HIGH (ref 70–99)

## 2022-07-06 MED ORDER — TRACE MINERALS CU-MN-SE-ZN 300-55-60-3000 MCG/ML IV SOLN
INTRAVENOUS | Status: AC
  Filled 2022-07-06: qty 681.33

## 2022-07-06 NOTE — Progress Notes (Signed)
PHARMACY - TOTAL PARENTERAL NUTRITION CONSULT NOTE   Indication: Prolonged ileus  Patient Measurements: Height: '4\' 11"'$  (149.9 cm) Weight: 96.8 kg (213 lb 6.5 oz) IBW/kg (Calculated) : 43.2 TPN AdjBW (KG): 55.2 Body mass index is 43.1 kg/m.  Assessment: Debra [name] is a 56 y.o. female s/p laparotomy, excision of greater omental mass, abdominal wall reconstruction with [name], appendectomy, and placement of Prevena vac.  Glucose / Insulin: No apparent history of diabetes. BG 138 - 193 (SSI 4x/day + 10u insulin in TPN):    SSI: 5 units /24 hrs Electrolytes: CTM Renal: Scr stable at apparent baseline  Hepatic: No transaminitis. LFTs within normal limits. TG within normal limits GI Imaging: 9/11 CTAP: no new acute issues GI Surgeries / Procedures: s/p laparotomy, excision of greater omental mass, abdominal wall reconstruction with [name], appendectomy, and placement of Prevena vac  Central access: 02/15/22 TPN start date: 02/15/22  Nutritional Goals: Goal cyclic TPN over 16 hrs: cyclic TPN over 16 hrs: (provides 102 g of protein and 1817 kcals per day) total volume over 16hrs for calculations=1400 ml(1500 ml total with overfill)  RD Assessment:  Estimated Needs Total Energy Estimated Needs: 1800-2100kcal/day Total Protein Estimated Needs: 90-110g/d Total Fluid Estimated Needs: 1.4-1.6L/day  Current Nutrition:  Soft diet + nutritional supplements, not meeting PO needs yet  -Patient walks around in afternoon/evening  Plan: Transitioned to *Cyclic* TPN on 4/68.   to run over 16 hours per MD request. [name] at 2000 per discussion with Dietician to allow patient time for walking in afternoon/evenings.  To run over 16 hours: -Start rate at 49 mL/hr for 1 hour. -Increase rate to 98 mL/hr for 13 hours.  -Decrease rate to 49 mL/hr for 1 hour. -Decrease rate to 25 mL/hr for 1 hours, then stop.  Nutritional Components Amino acids (using 15%  Clinisol): 102 g Dextrose 19% = 266 g Lipids (using 20% SMOFlipids): 50.4 g kCal: 1817/24h  Electrolytes in TPN (standard): Na 40mq/L, K 556m/L, Ca 6m86mL, Mg 10 mEq/L (max recommended), Phos 10 mmol/L. Cl:Ac 1:1 K 3.6  (on lasix BID). Will order KCL 20 meq PO x 1 dose Add MVI to TPN  Continue trace elements and chromium per discussion with RD. Discontinued Zinc  Continue CBG/SSI for Cyclic TPN: SSI  -CBG 2 hrs after cyclic TPN start -CBG during middle of cyclic TPN infusion -CBG 1 hr after cylic TPN stopped -CBG while off TPN (=4 CBGs per 24 hr period) SSI moderate + 10u insulin in TPN Check TPN labs on Mon/Thurs at minimum Monitor for further need of diuresis and adjust as needed for fluid status Continues Lasix 20 mg PO BID added 10/13   Kis[place]fordarmacist 07/06/2022 10:12 AM

## 2022-07-06 NOTE — Progress Notes (Signed)
[facility] SURGICAL PROGRESS NOTE (cpt [phone])  Hospital Day(s): 143.   Post op day(s): 141 Days Post-Op.   Interval History:  Patient seen and examined [name] intact; RN staff managing effluent with intermittent suction, no reported additional bleeding. There is no evidence of active bleeding at this moment.  We discussed judicious and careful bleeding of succus with additional care for her fragile healing tissues. No new labs this morning She is on soft diet; on cyclic TPN Ambulating independently   Review of Systems:  Constitutional: denies fever, chills  HEENT: denies cough or congestion  Respiratory: denies any shortness of breath  Cardiovascular: denies chest pain or palpitations  Gastrointestinal: denies abdominal pain, Nausea Genitourinary: denies burning with urination or urinary frequency Integumentary: + midline wound  Vital signs in last 24 hours: [min-max] current  Temp:  [97.5 F (36.4 C)-98.6 F (37 C)] 98.4 F (36.9 C) (10/29 0429) Pulse Rate:  [77-94] 77 (10/29 0429) Resp:  [18-20] 20 (10/29 0429) BP: (104-119)/(58-79) 109/58 (10/29 0429) SpO2:  [95 %-100 %] 97 % (10/29 0429)     Height: '4\' 11"'$  (149.9 cm) Weight: 96.8 kg BMI (Calculated): 43.04   Intake/Output last 2 shifts:  10/28 0701 - 10/29 0700 In: 854 [I.V.:854] Out: 654 [Drains:654]   Physical Exam:  Constitutional: alert, cooperative and no distress  Respiratory: breathing non-labored at rest  Cardiovascular: regular rate and sinus rhythm  Gastrointestinal: Soft, abdominal soreness on the right, non-distended, no rebound/guarding. Integumentary: Midline wound open, peritoneum closed; granulating; there are three stomatized areas visible in the LUQ and LLQ portions of the wound, output remains feculent.  There is no evidence of blood in her [name]'s pouch currently.   Labs:     Latest Ref Rng & Units 07/02/2022   10:13 AM 06/17/2022    5:03 AM 06/16/2022    8:33 AM  CBC  WBC  4.0 - 10.5 K/uL 3.7  2.7  2.5   Hemoglobin 12.0 - 15.0 g/dL 10.2  8.1  6.8   Hematocrit 36.0 - 46.0 % 32.6  25.6  21.8   Platelets 150 - 400 K/uL 84  69  65       Latest Ref Rng & Units 07/03/2022    7:55 AM 07/02/2022   10:13 AM 06/30/2022    5:30 AM  CMP  Glucose 70 - 99 mg/dL 180  162  172   BUN 6 - 20 mg/dL '26  26  24   '$ Creatinine 0.44 - 1.00 mg/dL 0.50  0.56  0.53   Sodium 135 - 145 mmol/L 136  137  136   Potassium 3.5 - 5.1 mmol/L 3.6  3.7  3.0   Chloride 98 - 111 mmol/L 102  102  101   CO2 22 - 32 mmol/L '28  29  29   '$ Calcium 8.9 - 10.3 mg/dL 8.5  8.8  8.3   Total Protein 6.5 - 8.1 g/dL 6.9   6.2   Total Bilirubin 0.3 - 1.2 mg/dL 0.7   0.5   Alkaline Phos 38 - 126 U/L 137   114   AST 15 - 41 U/L 39   31   ALT 0 - 44 U/L 24   18     Imaging studies: No new pertinent imaging studies   Assessment/Plan:  56 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   - New 10/28: No issues, spoke with nursing staff regarding challenges of keeping up with succus drainage, and avoiding traumatizing the soft tissues.   - Wound Care: [name] pouch with "window" but not on intermittent or continuous suction. Patient seems to like this better. She will need intermittent suction with Yankauer to control effluent. Likely be beneficial to suction before ambulation.    - On Soft diet + nutritional supplementation - Continue TPN; transition to QHS - Appreciate dietary assistance with trace elements; iron supplementation            - Monitor abdominal examination; on-going bowel function            - Pain control prn; antemetic prn            - Out of bed; discharged from therapies; no recommendations at this time   - Discharge Planning:  Anticipate lengthy admission and potential eventual transfer; no new progress    All of the above findings and recommendations were discussed with the patient, and the medical team, and all of patient's questions were answered to her expressed satisfaction.  Ronny Bacon, M.D., [facility]  07/06/2022 ; 7:26 AM

## 2022-07-07 LAB — COMPREHENSIVE METABOLIC PANEL
ALT: 27 U/L (ref 0–44)
AST: 46 U/L — ABNORMAL HIGH (ref 15–41)
Albumin: 3.1 g/dL — ABNORMAL LOW (ref 3.5–5.0)
Alkaline Phosphatase: 130 U/L — ABNORMAL HIGH (ref 38–126)
Anion gap: 5 (ref 5–15)
BUN: 27 mg/dL — ABNORMAL HIGH (ref 6–20)
CO2: 27 mmol/L (ref 22–32)
Calcium: 8.7 mg/dL — ABNORMAL LOW (ref 8.9–10.3)
Chloride: 103 mmol/L (ref 98–111)
Creatinine, Ser: 0.5 mg/dL (ref 0.44–1.00)
GFR, Estimated: >60 mL/min (ref >60)
Glucose, Bld: 176 mg/dL — ABNORMAL HIGH (ref 70–99)
Potassium: 4 mmol/L (ref 3.5–5.1)
Sodium: 135 mmol/L (ref 135–145)
Total Bilirubin: 0.7 mg/dL (ref 0.3–1.2)
Total Protein: 7.5 g/dL (ref 6.5–8.1)

## 2022-07-07 LAB — GLUCOSE, CAPILLARY
Glucose-Capillary: 110 mg/dL — ABNORMAL HIGH (ref 70–99)
Glucose-Capillary: 119 mg/dL — ABNORMAL HIGH (ref 70–99)
Glucose-Capillary: 137 mg/dL — ABNORMAL HIGH (ref 70–99)
Glucose-Capillary: 147 mg/dL — ABNORMAL HIGH (ref 70–99)
Glucose-Capillary: 150 mg/dL — ABNORMAL HIGH (ref 70–99)
Glucose-Capillary: 168 mg/dL — ABNORMAL HIGH (ref 70–99)
Glucose-Capillary: 182 mg/dL — ABNORMAL HIGH (ref 70–99)
Glucose-Capillary: 183 mg/dL — ABNORMAL HIGH (ref 70–99)
Glucose-Capillary: 154 mg/dL — ABNORMAL HIGH (ref 70–99)
Glucose-Capillary: 115 mg/dL — ABNORMAL HIGH (ref 70–99)
Glucose-Capillary: 149 mg/dL — ABNORMAL HIGH (ref 70–99)

## 2022-07-07 LAB — PHOSPHORUS: Phosphorus: 3 mg/dL (ref 2.5–4.6)

## 2022-07-07 LAB — MAGNESIUM: Magnesium: 2.1 mg/dL (ref 1.7–2.4)

## 2022-07-07 MED ORDER — TRACE MINERALS CU-MN-SE-ZN 300-55-60-3000 MCG/ML IV SOLN
INTRAVENOUS | Status: AC
  Filled 2022-07-07: qty 681.33

## 2022-07-07 NOTE — Plan of Care (Signed)
  Problem: Clinical Measurements: Goal: Will remain free from infection Outcome: Progressing   Problem: Nutrition: Goal: Adequate nutrition will be maintained Outcome: Progressing   Problem: Activity: Goal: Risk for activity intolerance will decrease Outcome: Progressing   Problem: Nutrition: Goal: Adequate nutrition will be maintained Outcome: Progressing   Problem: Coping: Goal: Level of anxiety will decrease Outcome: Progressing   Problem: Pain Managment: Goal: General experience of comfort will improve Outcome: Progressing

## 2022-07-07 NOTE — Consult Note (Signed)
[place] Nurse wound follow up Pouch was changed by the bedside nurse on 10/24. Current [name] pouch with window is intact with good seal over abd full thickness wound with fistulas; mod amt brown liquid, no wall suction attached. Nurses performing intermittent suction by inserting yankour periodically. Supplies at bedside and instructions have been provided if they need to change the pouch PRN if leaking. Thank-you,  Julien Girt MSN, [place], [place], [place], [place]

## 2022-07-07 NOTE — TOC Progression Note (Addendum)
Transition of Care [facility]) - Progression Note    Patient Details  Name: Debra [name] MRN: [id number] Date of Birth: July 02, 1966  Transition of Care [facility]) CM/SW Contact  Beverly Sessions, RN Phone Number: 07/07/2022, 11:47 AM  Clinical Narrative:     Patient now showing with [place] medicaid listed as payor. MD updated This could assist with obtaining home health Rn and TPN.   Would not assist with eakins pouches or suction for wound    Expected Discharge Plan: [place] Barriers to Discharge: Continued Medical Work up  Expected Discharge Plan and Services Expected Discharge Plan: [place]                                               Social Determinants of Health (SDOH) Interventions    Readmission Risk Interventions     No data to display

## 2022-07-07 NOTE — Progress Notes (Signed)
Mobility Specialist - Progress Note   07/07/22 1157  Mobility  Activity Ambulated independently in hallway  Level of Assistance Independent  Assistive Device None  Distance Ambulated (ft) 400 ft  Activity Response Tolerated well  Mobility Referral Yes  $Mobility charge 1 Mobility   Candie Mile Mobility Specialist 07/07/22 11:57 AM

## 2022-07-07 NOTE — Progress Notes (Signed)
[name] SUR[facility]ICAL PROGRESS NOTE (cpt [phone])  Hospital Day(s): 144.   Post op day(s): 142 Days Post-Op.   Interval History:  Patient seen and examined [name] intact; noted events of the weekend with bleeding, none this morning. Managing effluent with intermittent suction  She is on soft diet; on cyclic TPN Ambulating independently   Review of Systems:  Constitutional: denies fever, chills  HEENT: denies cough or congestion  Respiratory: denies any shortness of breath  Cardiovascular: denies chest pain or palpitations  Gastrointestinal: denies abdominal pain, N/V Genitourinary: denies burning with urination or urinary frequency Integumentary: + midline wound  Vital signs in last 24 hours: [min-max] current  Temp:  [97.9 F (36.6 C)-98 F (36.7 C)] 97.9 F (36.6 C) (10/30 0305) Pulse Rate:  [72-80] 74 (10/30 0305) Resp:  [20] 20 (10/30 0305) BP: (113-122)/(61-76) 122/62 (10/30 0305) SpO2:  [97 %-100 %] 97 % (10/30 0305) Weight:  [95.5 kg] 95.5 kg (10/30 0400)     Height: '4\' 11"'$  (149.9 cm) Weight: 95.5 kg BMI (Calculated): 43.04   Intake/Output last 2 shifts:  10/29 0701 - 10/30 0700 In: 614.9 [I.V.:614.9] Out: -    Physical Exam:  Constitutional: alert, cooperative and no distress  Respiratory: breathing non-labored at rest  Cardiovascular: regular rate and sinus rhythm  Gastrointestinal: Soft, abdominal soreness on the right, non-distended, no rebound/guarding. Integumentary: Midline wound open, peritoneum closed; granulating; there are three stomatized areas visible in the LUQ and LLQ portions of the wound, output remains feculent. There is hypergranulation tissue to the inferior wound edge.   Labs:     Latest Ref Rng & Units 07/02/2022   10:13 AM 06/17/2022    5:03 AM 06/16/2022    8:33 AM  CBC  WBC 4.0 - 10.5 K/uL 3.7  2.7  2.5   Hemoglobin 12.0 - 15.0 g/dL 10.2  8.1  6.8   Hematocrit 36.0 - 46.0 % 32.6  25.6  21.8   Platelets 150 - 400 K/uL  84  69  65       Latest Ref Rng & Units 07/03/2022    7:55 AM 07/02/2022   10:13 AM 06/30/2022    5:30 AM  CMP  Glucose 70 - 99 mg/dL 180  162  172   BUN 6 - 20 mg/dL '26  26  24   '$ Creatinine 0.44 - 1.00 mg/dL 0.50  0.56  0.53   Sodium 135 - 145 mmol/L 136  137  136   Potassium 3.5 - 5.1 mmol/L 3.6  3.7  3.0   Chloride 98 - 111 mmol/L 102  102  101   CO2 22 - 32 mmol/L '28  29  29   '$ Calcium 8.9 - 10.3 mg/dL 8.5  8.8  8.3   Total Protein 6.5 - 8.1 g/dL 6.9   6.2   Total Bilirubin 0.3 - 1.2 mg/dL 0.7   0.5   Alkaline Phos 38 - 126 U/L 137   114   AST 15 - 41 U/L 39   31   ALT 0 - 44 U/L 24   18     Imaging studies: No new pertinent imaging studies   Assessment/Plan:  56 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   - New 10/30: No issues   - Wound Care:  [name] pouch with "window" but not on intermittent or continuous suction. Patient seems to like this better. She will need intermittent suction with Yankauer to control effluent. Likely be beneficial to suction before ambulation.    - On Soft diet + nutritional supplementation - Continue TPN; transition to QHS - Appreciate dietary assistance with trace elements; iron supplementation            - Monitor abdominal examination; on-going bowel function            - Pain control prn; antemetic prn            - Out of bed; discharged from therapies; no recommendations at this time   - Discharge Planning: Anticipate lengthy admission and potential eventual transfer; no new progress    All of the above findings and recommendations were discussed with the patient, and the medical team, and all of patient's questions were answered to her expressed satisfaction.  -- [name], PA-C [facility] 07/07/2022, 9:09 AM M-F: 7am - 4pm

## 2022-07-07 NOTE — Progress Notes (Signed)
PHARMACY - TOTAL PARENTERAL NUTRITION CONSULT NOTE   Indication: Prolonged ileus  Patient Measurements: Height: '4\' 11"'$  (149.9 cm) Weight: 95.5 kg (210 lb 8.6 oz) IBW/kg (Calculated) : 43.2 TPN AdjBW (KG): 55.2 Body mass index is 42.52 kg/m.  Assessment: Debra [name] is a 56 y.o. female s/p laparotomy, excision of greater omental mass, abdominal wall reconstruction with [name], appendectomy, and placement of Prevena vac.  Glucose / Insulin: No apparent history of diabetes. BG 138 - 193 (SSI 4x/day + 10u insulin in TPN):    SSI: 9 units /24 hrs Electrolytes: CTM Renal: Scr stable at apparent baseline  Hepatic: No transaminitis. LFTs within normal limits. TG within normal limits GI Imaging: 9/11 CTAP: no new acute issues GI Surgeries / Procedures: s/p laparotomy, excision of greater omental mass, abdominal wall reconstruction with [name], appendectomy, and placement of Prevena vac  Central access: 02/15/22 TPN start date: 02/15/22  Nutritional Goals: Goal cyclic TPN over 16 hrs: cyclic TPN over 16 hrs: (provides 102 g of protein and 1817 kcals per day) total volume over 16hrs for calculations=1400 ml(1500 ml total with overfill)  RD Assessment:  Estimated Needs Total Energy Estimated Needs: 1800-2100kcal/day Total Protein Estimated Needs: 90-110g/d Total Fluid Estimated Needs: 1.4-1.6L/day  Current Nutrition:  Soft diet + nutritional supplements, not meeting PO needs yet  -Patient walks around in afternoon/evening  Plan: Transitioned to *Cyclic* TPN on 6/43.   to run over 16 hours per MD request. [name] at 2000 per discussion with Dietician to allow patient time for walking in afternoon/evenings.  To run over 16 hours: -Start rate at 49 mL/hr for 1 hour. -Increase rate to 98 mL/hr for 13 hours.  -Decrease rate to 49 mL/hr for 1 hour. -Decrease rate to 25 mL/hr for 1 hours, then stop.  Nutritional Components Amino acids (using 15%  Clinisol): 102 g Dextrose 19% = 266 g Lipids (using 20% SMOFlipids): 50.4 g kCal: 1817/24h  Electrolytes in TPN (standard): Na 8mq/L, K 512m/L, Ca 78m12mL, Mg 10 mEq/L (max recommended), Phos 10 mmol/L. Cl:Ac 1:1 K 3.6  (on lasix BID). Will order KCL 20 meq PO x 1 dose Add MVI to TPN  Continue trace elements and chromium per discussion with RD. Discontinued Zinc  Continue CBG/SSI for Cyclic TPN: SSI  -CBG 2 hrs after cyclic TPN start -CBG during middle of cyclic TPN infusion -CBG 1 hr after cylic TPN stopped -CBG while off TPN (=4 CBGs per 24 hr period) SSI moderate + 10u insulin in TPN Check TPN labs on Mon/Thurs at minimum Monitor for further need of diuresis and adjust as needed for fluid status Continues Lasix 20 mg PO BID added 10/13   KisAmargosaarmacist 07/07/2022 10:50 AM

## 2022-07-08 LAB — GLUCOSE, CAPILLARY
Glucose-Capillary: 172 mg/dL — ABNORMAL HIGH (ref 70–99)
Glucose-Capillary: 127 mg/dL — ABNORMAL HIGH (ref 70–99)
Glucose-Capillary: 107 mg/dL — ABNORMAL HIGH (ref 70–99)
Glucose-Capillary: 157 mg/dL — ABNORMAL HIGH (ref 70–99)
Glucose-Capillary: 146 mg/dL — ABNORMAL HIGH (ref 70–99)

## 2022-07-08 MED ORDER — TRACE MINERALS CU-MN-SE-ZN 300-55-60-3000 MCG/ML IV SOLN
INTRAVENOUS | Status: AC
  Filled 2022-07-08: qty 681.33

## 2022-07-08 NOTE — Progress Notes (Signed)
Patients chest port got clogged up with the TPN tonight attempt made to flush but couldn't get anything. IV team came and unclogged patients port but patient was already disconnected from TPN and it could not be started back since it was disconnected. Notified Dr. Peyton Najjar about the need to start IV dextrose or not. He advised me to speak with pharmacy.  I discussed with Ovid Curd the pharmacist about her TPN being cyclic and tapered down every day for a total of 16 hours. He advised to not start any dextrose.  No new orders. TPN is stopped. Will continue to monitor patient.

## 2022-07-08 NOTE — Plan of Care (Signed)
  Problem: Clinical Measurements: Goal: Will remain free from infection Outcome: Progressing   Problem: Activity: Goal: Risk for activity intolerance will decrease Outcome: Progressing   Problem: Nutrition: Goal: Adequate nutrition will be maintained Outcome: Progressing   Problem: Coping: Goal: Level of anxiety will decrease Outcome: Progressing   Problem: Pain Managment: Goal: General experience of comfort will improve Outcome: Progressing   

## 2022-07-08 NOTE — Progress Notes (Signed)
Nutrition Follow-up  DOCUMENTATION CODES:   Obesity unspecified  INTERVENTION:   Continue cyclic TPN per pharmacy (16 hrs)- provides 1817kcal/day and 102g/day protein   Ensure Enlive po TID, each supplement provides 350 kcal and 20 grams of protein.  MVI, chromium and trace elements in TPN   Daily weights   Vitamin D2-  50,000 units po weekly   Vitamin A- 100,000 units IM daily x 3 days, followed by 50,000 units IM daily x 7 days.   Check manganese, selenium, zinc and copper labs 11/2.   NUTRITION DIAGNOSIS:   Increased nutrient needs related to wound healing, catabolic illness as evidenced by estimated needs.  GOAL:   Patient will meet greater than or equal to 90% of their needs -met with TPN   MONITOR:   PO intake, Supplement acceptance, Labs, Weight trends, Diet advancement, I & O's, TPN  ASSESSMENT:   56 y/o female with h/o hypothyroidism, COVID 19 (3/21), kidney stones and stage 3 colon cancer (s/p left hemicolectomy 5/21 and chemotherapy) who is admitted for new pelvic mass now s/p laparotomy 6/8 (with excision of pelvic mass from greater omentum, abdominal wall reconstruction with bilateral myocutaneous flaps and mesh, incisional hernia repair, appendectomy repair and VAC placement) complicated by bowel perforation s/p reopening of recent laparotomy 6/10 (with repair of small bowel perforation, excision of mesh, placement of two phasix mesh and VAC placement). Pathology returned as metastatic adenocarcinoma.   Pt continues to tolerate TPN well at goal rate; TPN is currently being cycled for 16 hours. Triglycerides wnl and are being checked monthly. Hyperglycemia resolved with insulin in TPN. Volume status improved with diuresis; pt remains up ~14lbs from admission. Will follow up with trace element labs on 11/2. Will recheck vitamins 11/16. Pt continues to have poor oral intake. Pt is drinking some Ensure. [name] pouch remains in place with intermittent suction.     Medications reviewed and include: D2, lasix, heparin, insulin, L-glutamine, synthroid, imodium, protonix, carafate, vitamin A  -Selenium 81(L), Chromium 1.4 wnl, Manganese 26.3(H), Iodine 57.9 wnl, copper 97 wnl, zinc 90 wnl- 10/2 -Iron 31.0 wnl, TIBC 360, ferritin 33 wnl, transferrin 272, folate 10.5 wnl, B12 291- 10/16 -Vitamin B1- 117.2, vitamin D 19.46(L), vitamin A 14.3(L), B6 11.9 wnl, vitamin C 0.8, vitamin E 13.4 wnl, vitamin K 0.64 wnl- 10/16  Labs reviewed: Na 135 wnl, K 4.0 wnl, BUN 27(H), P 3.0 wnl, Mg 2.1 wnl- 10/30 Triglycerides- 98-10/16 Cbgs- 107, 127, 172 x 24 hrs  Diet Order:    Diet Order             DIET SOFT Room service appropriate? Yes; Fluid consistency: Thin  Diet effective now                  EDUCATION NEEDS:   Not appropriate for education at this time  Skin:  Skin Assessment: Reviewed RN Assessment ( Midline wound: 15cm x 10cm x 6cm )  Last BM:  10/30- type 6  Height:   Ht Readings from Last 1 Encounters:  03/01/22 _0  (1.499 m)    Weight:   Wt Readings from Last 1 Encounters:  07/08/22 95.6 kg    Ideal Body Weight:  44.3 kg  BMI:  Body mass index is 42.57 kg/m.  Estimated Nutritional Needs:   Kcal:  1800-2100kcal/day  Protein:  90-110g/d  Fluid:  1.4-1.6L/day  Koleen Distance MS, RD, LDN Please refer to [facility] for RD and/or RD on-call/weekend/after hours pager

## 2022-07-08 NOTE — Progress Notes (Signed)
Mobility Specialist - Progress Note   07/08/22 1629  Mobility  Activity Ambulated independently in hallway  Level of Assistance Independent  Assistive Device None  Distance Ambulated (ft) 1000 ft  Activity Response Tolerated well  Mobility Referral Yes  $Mobility charge 1 Mobility   Candie Mile Mobility Specialist 07/08/22 4:30 PM

## 2022-07-08 NOTE — TOC Progression Note (Signed)
Transition of Care [facility]) - Progression Note    Patient Details  Name: Debra [name] MRN: [id number] Date of Birth: 1966/07/30  Transition of Care [facility]) CM/SW Contact  Beverly Sessions, RN Phone Number: 07/08/2022, 3:05 PM  Clinical Narrative:     Per MD [facility] has declined transfer   Expected Discharge Plan: [place] Barriers to Discharge: Continued Medical Work up  Expected Discharge Plan and Services Expected Discharge Plan: [place]                                               Social Determinants of Health (SDOH) Interventions    Readmission Risk Interventions     No data to display

## 2022-07-08 NOTE — Progress Notes (Addendum)
PHARMACY - TOTAL PARENTERAL NUTRITION CONSULT NOTE   Indication: Prolonged ileus  Patient Measurements: Height: '4\' 11"'$  (149.9 cm) Weight: 95.6 kg (210 lb 12.2 oz) IBW/kg (Calculated) : 43.2 TPN AdjBW (KG): 55.2 Body mass index is 42.57 kg/m.  Assessment: Nomi Mike Berntsen is a 56 y.o. female s/p laparotomy, excision of greater omental mass, abdominal wall reconstruction with [name], appendectomy, and placement of Prevena vac.  Glucose / Insulin: No apparent history of diabetes. BG 115-176(SSI 4x/day + 10u insulin in TPN):    SSI: 8units /24 hrs Electrolytes: CTM Renal: Scr stable at apparent baseline  Hepatic: No transaminitis. LFTs within normal limits. TG within normal limits GI Imaging: 9/11 CTAP: no new acute issues GI Surgeries / Procedures: s/p laparotomy, excision of greater omental mass, abdominal wall reconstruction with [name], appendectomy, and placement of Prevena vac  Central access: 02/15/22 TPN start date: 02/15/22  Nutritional Goals: Goal cyclic TPN over 16 hrs: cyclic TPN over 16 hrs: (provides 102 g of protein and 1817 kcals per day) total volume over 16hrs for calculations=1400 ml(1500 ml total with overfill)  RD Assessment:  Estimated Needs Total Energy Estimated Needs: 1800-2100kcal/day Total Protein Estimated Needs: 90-110g/d Total Fluid Estimated Needs: 1.4-1.6L/day  Current Nutrition:  Soft diet + nutritional supplements, not meeting PO needs yet  -Patient walks around in afternoon/evening  Plan: Transitioned to *Cyclic* TPN on 8/76.   to run over 16 hours per MD request. [name] at 2000 per discussion with Dietician to allow patient time for walking in afternoon/evenings.  To run over 16 hours: -Start rate at 49 mL/hr for 1 hour. -Increase rate to 98 mL/hr for 13 hours.  -Decrease rate to 49 mL/hr for 1 hour. -Decrease rate to 25 mL/hr for 1 hours, then stop.  Nutritional Components Amino acids (using 15%  Clinisol): 102 g Dextrose 19% = 266 g Lipids (using 20% SMOFlipids): 50.4 g kCal: 1817/24h  Electrolytes in TPN (standard): Na 30mq/L, K 537m/L, Ca 37m53mL, Mg 10 mEq/L (max recommended), Phos 10 mmol/L. Cl:Ac 1:1 Add MVI to TPN  Continue trace elements and chromium per discussion with RD. Discontinued Zinc  Continue CBG/SSI for Cyclic TPN: SSI  -CBG 2 hrs after cyclic TPN start -CBG during middle of cyclic TPN infusion -CBG 1 hr after cylic TPN stopped -CBG while off TPN (=4 CBGs per 24 hr period) SSI moderate + 10u insulin in TPN Check TPN labs on Mon/Thurs at minimum Monitor for further need of diuresis and adjust as needed for fluid status Continues Lasix 20 mg PO BID added 10/13  10/31: had issue with TPN clogging chest port on 10/30, TPN was disconnected. IV team unclogged port. TPN not restarted(See RN note).   Chales Pelissier A Clinical Pharmacist 07/08/2022 8:20 AM

## 2022-07-08 NOTE — Progress Notes (Signed)
VAST consulted to assess port access as unit RN reported she could not get blood return or flush port. Changed cap and was unable to obtain blood return; able to flush line, but was sluggish. De-accessed port and re-accessed port with new needle; excellent blood return and easy to flush. Unit RN notified.

## 2022-07-08 NOTE — Progress Notes (Signed)
[facility] SUR[facility]ICAL PROGRESS NOTE (cpt [id number])  Hospital Day(s): 145.   Post op day(s): 143 Days Post-Op.   Interval History:  Patient seen and examined Overnight issues with port noted; appreciate IV team assistance [name] intact; Managing effluent with intermittent suction; some clots this AM She is on soft diet; on cyclic TPN Ambulating independently   Review of Systems:  Constitutional: denies fever, chills  HEENT: denies cough or congestion  Respiratory: denies any shortness of breath  Cardiovascular: denies chest pain or palpitations  Gastrointestinal: denies abdominal pain, N/V Genitourinary: denies burning with urination or urinary frequency Integumentary: + midline wound  Vital signs in last 24 hours: [min-max] current  Temp:  [98 F (36.7 C)-98.9 F (37.2 C)] 98.1 F (36.7 C) (10/31 0538) Pulse Rate:  [77-82] 80 (10/31 0538) Resp:  [16-20] 20 (10/31 0538) BP: (106-118)/(60-79) 118/79 (10/31 0538) SpO2:  [93 %-100 %] 100 % (10/31 0538) Weight:  [95.6 kg] 95.6 kg (10/31 0538)     Height: '4\' 11"'$  (149.9 cm) Weight: 95.6 kg BMI (Calculated): 43.04   Intake/Output last 2 shifts:  10/30 0701 - 10/31 0700 In: 360 [P.O.:360] Out: 120 [Drains:120]   Physical Exam:  Constitutional: alert, cooperative and no distress  Respiratory: breathing non-labored at rest  Cardiovascular: regular rate and sinus rhythm  Gastrointestinal: Soft, abdominal soreness on the right, non-distended, no rebound/guarding. Integumentary: Midline wound open, peritoneum closed; granulating; there are three stomatized areas visible in the LUQ and LLQ portions of the wound, output remains feculent. There is hypergranulation tissue to the inferior wound edge.   Labs:     Latest Ref Rng & Units 07/02/2022   10:13 AM 06/17/2022    5:03 AM 06/16/2022    8:33 AM  CBC  WBC 4.0 - 10.5 K/uL 3.7  2.7  2.5   Hemoglobin 12.0 - 15.0 g/dL 10.2  8.1  6.8   Hematocrit 36.0 - 46.0 % 32.6   25.6  21.8   Platelets 150 - 400 K/uL 84  69  65       Latest Ref Rng & Units 07/07/2022    9:36 AM 07/03/2022    7:55 AM 07/02/2022   10:13 AM  CMP  Glucose 70 - 99 mg/dL 176  180  162   BUN 6 - 20 mg/dL '27  26  26   '$ Creatinine 0.44 - 1.00 mg/dL 0.50  0.50  0.56   Sodium 135 - 145 mmol/L 135  136  137   Potassium 3.5 - 5.1 mmol/L 4.0  3.6  3.7   Chloride 98 - 111 mmol/L 103  102  102   CO2 22 - 32 mmol/L '27  28  29   '$ Calcium 8.9 - 10.3 mg/dL 8.7  8.5  8.8   Total Protein 6.5 - 8.1 g/dL 7.5  6.9    Total Bilirubin 0.3 - 1.2 mg/dL 0.7  0.7    Alkaline Phos 38 - 126 U/L 130  137    AST 15 - 41 U/L 46  39    ALT 0 - 44 U/L 27  24      Imaging studies: No new pertinent imaging studies   Assessment/Plan:  56 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   - New 10/31: Port issues overnight noted and  resolved; nothing new this AM   - Wound Care: [name] pouch with "window" but not on intermittent or continuous suction. Patient seems to like this better. She will need intermittent suction with Yankauer to control effluent. Likely be beneficial to suction before ambulation.    - On Soft diet + nutritional supplementation - Continue TPN; cyclic - Appreciate dietary assistance with trace elements; iron supplementation            - Monitor abdominal examination; on-going bowel function            - Pain control prn; antemetic prn            - Out of bed; discharged from therapies; no recommendations at this time   - Discharge Planning: Anticipate lengthy admission and potential eventual transfer; no new progress    All of the above findings and recommendations were discussed with the patient, and the medical team, and all of  patient's questions were answered to her expressed satisfaction.  -- [name], PA-C [facility] 07/08/2022, 7:08 AM M-F: 7am - 4pm

## 2022-07-09 LAB — GLUCOSE, CAPILLARY
Glucose-Capillary: 184 mg/dL — ABNORMAL HIGH (ref 70–99)
Glucose-Capillary: 124 mg/dL — ABNORMAL HIGH (ref 70–99)
Glucose-Capillary: 142 mg/dL — ABNORMAL HIGH (ref 70–99)
Glucose-Capillary: 113 mg/dL — ABNORMAL HIGH (ref 70–99)

## 2022-07-09 MED ORDER — STERILE WATER FOR INJECTION IV SOLN
INTRAVENOUS | Status: AC
  Filled 2022-07-09: qty 681.33

## 2022-07-09 MED ORDER — TRACE MINERALS CU-MN-SE-ZN 300-55-60-3000 MCG/ML IV SOLN
INTRAVENOUS | Status: DC
  Filled 2022-07-09: qty 681.33

## 2022-07-09 MED ORDER — CYCLOBENZAPRINE HCL 10 MG PO TABS
5.0000 mg | ORAL_TABLET | Freq: Three times a day (TID) | ORAL | Status: DC
  Administered 2022-07-09 – 2022-07-26 (×47): 5 mg via ORAL
  Filled 2022-07-09 (×50): qty 1

## 2022-07-09 MED ORDER — DIAZEPAM 5 MG/ML IJ SOLN
5.0000 mg | Freq: Once | INTRAMUSCULAR | Status: AC
  Administered 2022-07-09: 5 mg via INTRAMUSCULAR
  Filled 2022-07-09: qty 2

## 2022-07-09 NOTE — Plan of Care (Signed)
  Problem: Clinical Measurements: Goal: Will remain free from infection Outcome: Progressing   Problem: Nutrition: Goal: Adequate nutrition will be maintained Outcome: Progressing   Problem: Activity: Goal: Risk for activity intolerance will decrease Outcome: Progressing

## 2022-07-09 NOTE — Progress Notes (Addendum)
Brief Progress Note Called back to patient room this afternoon. Patient was washing her hair and bent over and had acute onset of bilateral paraspinal lower back pain and spasm. Pain is worse with movement. Better at rest. She denied any radiculopathy, lower extremity weakness, and no urinary incontinence.   She is tender with spasm to bilateral low back, no midline tenderness. She denied any pain with SLR but certainly winces. Muscle strength 5/5 in bilateral LE, sensation intact BLE as well.   Abdominal examination unchanged  Plan:  -- Will give one time dose of IM Valium -- Added '5mg'$  Flexeril TID PRN for spasm   -- [name], PA-C [facility] 07/09/2022, 12:36 PM M-F: 7am - 4pm

## 2022-07-09 NOTE — Progress Notes (Signed)
[facility] SUR[facility] PROGRESS NOTE (cpt [phone])  Hospital Day(s): 146.   Post op day(s): 144 Days Post-Op.   Interval History:  Patient seen and examined Overnight issues with port again noted; appreciate IV team assistance [name] intact; Managing effluent with intermittent suction She is on soft diet; on cyclic TPN Ambulating independently   Review of Systems:  Constitutional: denies fever, chills  HEENT: denies cough or congestion  Respiratory: denies any shortness of breath  Cardiovascular: denies chest pain or palpitations  Gastrointestinal: denies abdominal pain, N/V Genitourinary: denies burning with urination or urinary frequency Integumentary: + midline wound  Vital signs in last 24 hours: [min-max] current  Temp:  [98.3 F (36.8 C)-99.4 F (37.4 C)] 99.4 F (37.4 C) (11/01 0741) Pulse Rate:  [80-86] 86 (11/01 0741) Resp:  [16-20] 18 (11/01 0741) BP: (100-117)/(55-66) 113/57 (11/01 0741) SpO2:  [94 %-100 %] 94 % (11/01 0741) Weight:  [97.2 kg] 97.2 kg (11/01 0500)     Height: '4\' 11"'$  (149.9 cm) Weight: 97.2 kg BMI (Calculated): 43.04   Intake/Output last 2 shifts:  10/31 0701 - 11/01 0700 In: 120 [P.O.:120] Out: 300 [Drains:300]   Physical Exam:  Constitutional: alert, cooperative and no distress  Respiratory: breathing non-labored at rest  Cardiovascular: regular rate and sinus rhythm  Gastrointestinal: Soft, abdominal soreness on the right, non-distended, no rebound/guarding. Integumentary: Midline wound open, peritoneum closed; granulating; there are three stomatized areas visible in the LUQ and LLQ portions of the wound, output remains feculent. There is hypergranulation tissue to the inferior wound edge.   Labs:     Latest Ref Rng & Units 07/02/2022   10:13 AM 06/17/2022    5:03 AM 06/16/2022    8:33 AM  CBC  WBC 4.0 - 10.5 K/uL 3.7  2.7  2.5   Hemoglobin 12.0 - 15.0 g/dL 10.2  8.1  6.8   Hematocrit 36.0 - 46.0 % 32.6  25.6  21.8    Platelets 150 - 400 K/uL 84  69  65       Latest Ref Rng & Units 07/07/2022    9:36 AM 07/03/2022    7:55 AM 07/02/2022   10:13 AM  CMP  Glucose 70 - 99 mg/dL 176  180  162   BUN 6 - 20 mg/dL '27  26  26   '$ Creatinine 0.44 - 1.00 mg/dL 0.50  0.50  0.56   Sodium 135 - 145 mmol/L 135  136  137   Potassium 3.5 - 5.1 mmol/L 4.0  3.6  3.7   Chloride 98 - 111 mmol/L 103  102  102   CO2 22 - 32 mmol/L '27  28  29   '$ Calcium 8.9 - 10.3 mg/dL 8.7  8.5  8.8   Total Protein 6.5 - 8.1 g/dL 7.5  6.9    Total Bilirubin 0.3 - 1.2 mg/dL 0.7  0.7    Alkaline Phos 38 - 126 U/L 130  137    AST 15 - 41 U/L 46  39    ALT 0 - 44 U/L 27  24      Imaging studies: No new pertinent imaging studies   Assessment/Plan:  56 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   - New 11/01: Port issues overnight again noted and resolved; nothing  new this AM   - Wound Care: [name] pouch with "window" but not on intermittent or continuous suction. Patient seems to like this better. She will need intermittent suction with Yankauer to control effluent. Likely be beneficial to suction before ambulation.    - On Soft diet + nutritional supplementation - Continue TPN; cyclic - Appreciate dietary assistance with trace elements; iron supplementation            - Monitor abdominal examination; on-going bowel function            - Pain control prn; antemetic prn            - Out of bed; discharged from therapies; no recommendations at this time   - Discharge Planning: Anticipate lengthy admission and potential eventual transfer; no new progress    All of the above findings and recommendations were discussed with the patient, and the medical team, and all of  patient's questions were answered to her expressed satisfaction.  -- [name], PA-C [facility] 07/09/2022, 9:49 AM M-F: 7am - 4pm

## 2022-07-10 LAB — COMPREHENSIVE METABOLIC PANEL
ALT: 28 U/L (ref 0–44)
AST: 43 U/L — ABNORMAL HIGH (ref 15–41)
Albumin: 3 g/dL — ABNORMAL LOW (ref 3.5–5.0)
Alkaline Phosphatase: 136 U/L — ABNORMAL HIGH (ref 38–126)
Anion gap: 7 (ref 5–15)
BUN: 37 mg/dL — ABNORMAL HIGH (ref 6–20)
CO2: 24 mmol/L (ref 22–32)
Calcium: 8.8 mg/dL — ABNORMAL LOW (ref 8.9–10.3)
Chloride: 104 mmol/L (ref 98–111)
Creatinine, Ser: 0.81 mg/dL (ref 0.44–1.00)
GFR, Estimated: >60 mL/min (ref >60)
Glucose, Bld: 177 mg/dL — ABNORMAL HIGH (ref 70–99)
Potassium: 3.5 mmol/L (ref 3.5–5.1)
Sodium: 135 mmol/L (ref 135–145)
Total Bilirubin: 0.5 mg/dL (ref 0.3–1.2)
Total Protein: 7.4 g/dL (ref 6.5–8.1)

## 2022-07-10 LAB — PHOSPHORUS: Phosphorus: 3.8 mg/dL (ref 2.5–4.6)

## 2022-07-10 LAB — GLUCOSE, CAPILLARY
Glucose-Capillary: 170 mg/dL — ABNORMAL HIGH (ref 70–99)
Glucose-Capillary: 202 mg/dL — ABNORMAL HIGH (ref 70–99)
Glucose-Capillary: 159 mg/dL — ABNORMAL HIGH (ref 70–99)
Glucose-Capillary: 199 mg/dL — ABNORMAL HIGH (ref 70–99)

## 2022-07-10 LAB — MAGNESIUM: Magnesium: 2.3 mg/dL (ref 1.7–2.4)

## 2022-07-10 MED ORDER — STERILE WATER FOR INJECTION IV SOLN
INTRAVENOUS | Status: AC
  Filled 2022-07-10: qty 681.33

## 2022-07-10 NOTE — Progress Notes (Signed)
[facility] SUR[facility]ICAL PROGRESS NOTE (cpt [phone])  Hospital Day(s): 147.   Post op day(s): 145 Days Post-Op.   Interval History:  Patient seen and examined Back pain is still present but improved [name] intact; Managing effluent with intermittent suction She is on soft diet; on cyclic TPN Ambulating independently   Review of Systems:  Constitutional: denies fever, chills  HEENT: denies cough or congestion  Respiratory: denies any shortness of breath  Cardiovascular: denies chest pain or palpitations  Gastrointestinal: denies abdominal pain, N/V Genitourinary: denies burning with urination or urinary frequency Integumentary: + midline wound  Vital signs in last 24 hours: [min-max] current  Temp:  [98 F (36.7 C)-99.4 F (37.4 C)] 98.2 F (36.8 C) (11/02 0509) Pulse Rate:  [74-93] 93 (11/02 0509) Resp:  [18-20] 20 (11/02 0509) BP: (113-140)/(57-81) 140/70 (11/02 0509) SpO2:  [94 %-100 %] 94 % (11/02 0509)     Height: '4\' 11"'$  (149.9 cm) Weight: 97.2 kg BMI (Calculated): 43.04   Intake/Output last 2 shifts:  11/01 0701 - 11/02 0700 In: 638.4 [I.V.:638.4] Out: -    Physical Exam:  Constitutional: alert, cooperative and no distress  Respiratory: breathing non-labored at rest  Cardiovascular: regular rate and sinus rhythm  Gastrointestinal: Soft, abdominal soreness on the right, non-distended, no rebound/guarding. Integumentary: Midline wound open, peritoneum closed; granulating; there are three stomatized areas visible in the LUQ and LLQ portions of the wound, output remains feculent. There is hypergranulation tissue to the inferior wound edge. Musculoskeletal: She continues to have left > right paraspinal tenderness, no midline pain, no lower extremity weakness, sensation loss, nor incontinence    Labs:     Latest Ref Rng & Units 07/02/2022   10:13 AM 06/17/2022    5:03 AM 06/16/2022    8:33 AM  CBC  WBC 4.0 - 10.5 K/uL 3.7  2.7  2.5   Hemoglobin 12.0 -  15.0 g/dL 10.2  8.1  6.8   Hematocrit 36.0 - 46.0 % 32.6  25.6  21.8   Platelets 150 - 400 K/uL 84  69  65       Latest Ref Rng & Units 07/07/2022    9:36 AM 07/03/2022    7:55 AM 07/02/2022   10:13 AM  CMP  Glucose 70 - 99 mg/dL 176  180  162   BUN 6 - 20 mg/dL '27  26  26   '$ Creatinine 0.44 - 1.00 mg/dL 0.50  0.50  0.56   Sodium 135 - 145 mmol/L 135  136  137   Potassium 3.5 - 5.1 mmol/L 4.0  3.6  3.7   Chloride 98 - 111 mmol/L 103  102  102   CO2 22 - 32 mmol/L '27  28  29   '$ Calcium 8.9 - 10.3 mg/dL 8.7  8.5  8.8   Total Protein 6.5 - 8.1 g/dL 7.5  6.9    Total Bilirubin 0.3 - 1.2 mg/dL 0.7  0.7    Alkaline Phos 38 - 126 U/L 130  137    AST 15 - 41 U/L 46  39    ALT 0 - 44 U/L 27  24      Imaging studies: No new pertinent imaging studies   Assessment/Plan:  56 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   -  New 11/02: Continue muscle relaxant for back pain/spasm, no issues with [name] today    - Wound Care: [name] pouch with "window" but not on intermittent or continuous suction. Patient seems to like this better. She will need intermittent suction with Yankauer to control effluent. Likely be beneficial to suction before ambulation.    - On Soft diet + nutritional supplementation - Continue TPN; cyclic - Appreciate dietary assistance with trace elements; iron supplementation            - Monitor abdominal examination; on-going bowel function            - Pain control prn; antemetic prn            - Out of bed; discharged from therapies; no recommendations at this time   - Discharge Planning: Anticipate lengthy admission and potential eventual transfer; no new progress    All of the above findings and  recommendations were discussed with the patient, and the medical team, and all of patient's questions were answered to her expressed satisfaction.  -- [name], PA-C [facility] 07/10/2022, 7:39 AM M-F: 7am - 4pm

## 2022-07-10 NOTE — Plan of Care (Signed)
  Problem: Clinical Measurements: Goal: Will remain free from infection Outcome: Progressing   Problem: Nutrition: Goal: Adequate nutrition will be maintained Outcome: Progressing   Problem: Activity: Goal: Risk for activity intolerance will decrease Outcome: Progressing

## 2022-07-10 NOTE — Consult Note (Addendum)
[place] Nurse wound follow up Current [name] pouch has been in place 9 days. Changed today to attempt to avoid leakage occurrence over the weekend. Pt tolerated procedure without discomfort. Abd full thickness 10X13X.5cm wound is red and moist, with 3 fistula stomas; mod amt liquid brown stool.  No bleeding at this time.  Wound edges are red and slightly macerated. Applied new medium [name] pouch with window, which allows bedside nurses to suction periodically with a yankor to avoid overfilling at the lower edges, which can cause leakage. Ordered 3 additional large [name] pouches with a window to the room for staff nurse's use; [place] # [id number].  Orders have been provided for bedside nurses as follows:  Bedside nurse; please change PRN if pouch is leaking. Trace pattern in room, cut out and place over the wound. Intermittently suction out contents with are pooling. Pattern for [name] in the room. DO NOT THROW AWAY. Thank-you,  Julien Girt MSN, [place], [place], [place], [place]

## 2022-07-10 NOTE — TOC Progression Note (Signed)
Transition of Care [facility]) - Progression Note    Patient Details  Name: Debra [name] MRN: [id number] Date of Birth: [birth year]  Transition [facility] Care [facility]) CM/SW Contact  Beverly Sessions, RN Phone Number: 07/10/2022, 11:02 AM  Clinical Narrative:     No TOC update Throughout hospital stay [facility] supervisor, [place] director, and MD advisor have been updated on case   Expected Discharge Plan: [place] Barriers to Discharge: Continued Medical Work up  Expected Discharge Plan and Services Expected Discharge Plan: [place]                                               Social Determinants of Health (SDOH) Interventions    Readmission Risk Interventions     No data to display

## 2022-07-10 NOTE — Plan of Care (Signed)
  Problem: Clinical Measurements: Goal: Will remain free from infection Outcome: Progressing   Problem: Nutrition: Goal: Adequate nutrition will be maintained Outcome: Progressing   Problem: Activity: Goal: Risk for activity intolerance will decrease Outcome: Progressing   Problem: Nutrition: Goal: Adequate nutrition will be maintained Outcome: Progressing   Problem: Coping: Goal: Level of anxiety will decrease Outcome: Progressing   Problem: Pain Managment: Goal: General experience of comfort will improve Outcome: Progressing

## 2022-07-11 LAB — GLUCOSE, CAPILLARY
Glucose-Capillary: 225 mg/dL — ABNORMAL HIGH (ref 70–99)
Glucose-Capillary: 237 mg/dL — ABNORMAL HIGH (ref 70–99)
Glucose-Capillary: 145 mg/dL — ABNORMAL HIGH (ref 70–99)
Glucose-Capillary: 98 mg/dL (ref 70–99)
Glucose-Capillary: 100 mg/dL — ABNORMAL HIGH (ref 70–99)
Glucose-Capillary: 178 mg/dL — ABNORMAL HIGH (ref 70–99)

## 2022-07-11 MED ORDER — TRACE MINERALS CU-MN-SE-ZN 300-55-60-3000 MCG/ML IV SOLN
INTRAVENOUS | Status: AC
  Filled 2022-07-11: qty 681.33

## 2022-07-11 NOTE — Progress Notes (Signed)
[facility] SUR[facility] PROGRESS NOTE (cpt [phone])  Hospital Day(s): 148.   Post op day(s): 146 Days Post-Op.   Interval History:  Patient seen and examined [name] intact; Managing effluent with intermittent suction Changed 11/02 She is on soft diet; on cyclic TPN Ambulating independently   Review of Systems:  Constitutional: denies fever, chills  HEENT: denies cough or congestion  Respiratory: denies any shortness of breath  Cardiovascular: denies chest pain or palpitations  Gastrointestinal: denies abdominal pain, N/V Genitourinary: denies burning with urination or urinary frequency Integumentary: + midline wound  Vital signs in last 24 hours: [min-max] current  Temp:  [97.8 F (36.6 C)-99.2 F (37.3 C)] 99.2 F (37.3 C) (11/03 0717) Pulse Rate:  [84-92] 92 (11/03 0717) Resp:  [14-18] 18 (11/03 0717) BP: (122-130)/(74-79) 122/74 (11/03 0717) SpO2:  [91 %-95 %] 93 % (11/03 0717) Weight:  [96.6 kg] 96.6 kg (11/03 0500)     Height: '4\' 11"'$  (149.9 cm) Weight: 96.6 kg BMI (Calculated): 43.04   Intake/Output last 2 shifts:  11/02 0701 - 11/03 0700 In: 1876.5 [P.O.:240; I.V.:1636.5] Out: 2200 [Urine:2100; Drains:100]   Physical Exam:  Constitutional: alert, cooperative and no distress  Respiratory: breathing non-labored at rest  Cardiovascular: regular rate and sinus rhythm  Gastrointestinal: Soft, abdominal soreness on the right, non-distended, no rebound/guarding. Integumentary: Midline wound open, peritoneum closed; granulating; there are three stomatized areas visible in the LUQ and LLQ portions of the wound, output remains feculent. There is hypergranulation tissue to the inferior wound edge.   Labs:     Latest Ref Rng & Units 07/02/2022   10:13 AM 06/17/2022    5:03 AM 06/16/2022    8:33 AM  CBC  WBC 4.0 - 10.5 K/uL 3.7  2.7  2.5   Hemoglobin 12.0 - 15.0 g/dL 10.2  8.1  6.8   Hematocrit 36.0 - 46.0 % 32.6  25.6  21.8   Platelets 150 - 400 K/uL 84   69  65       Latest Ref Rng & Units 07/10/2022   11:53 AM 07/07/2022    9:36 AM 07/03/2022    7:55 AM  CMP  Glucose 70 - 99 mg/dL 177  176  180   BUN 6 - 20 mg/dL 37  27  26   Creatinine 0.44 - 1.00 mg/dL 0.81  0.50  0.50   Sodium 135 - 145 mmol/L 135  135  136   Potassium 3.5 - 5.1 mmol/L 3.5  4.0  3.6   Chloride 98 - 111 mmol/L 104  103  102   CO2 22 - 32 mmol/L '24  27  28   '$ Calcium 8.9 - 10.3 mg/dL 8.8  8.7  8.5   Total Protein 6.5 - 8.1 g/dL 7.4  7.5  6.9   Total Bilirubin 0.3 - 1.2 mg/dL 0.5  0.7  0.7   Alkaline Phos 38 - 126 U/L 136  130  137   AST 15 - 41 U/L 43  46  39   ALT 0 - 44 U/L '28  27  24     '$ Imaging studies: No new pertinent imaging studies   Assessment/Plan:  56 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   - New 11/02: No new issues    - Wound Care:  [name] pouch with "window" but not on intermittent or continuous suction. Patient seems to like this better. She will need intermittent suction with Yankauer to control effluent. Likely be beneficial to suction before ambulation.    - On Soft diet + nutritional supplementation - Continue TPN; cyclic - Appreciate dietary assistance with trace elements; iron supplementation            - Monitor abdominal examination; on-going bowel function            - Pain control prn; antemetic prn            - Out of bed; discharged from therapies; no recommendations at this time   - Discharge Planning: Anticipate lengthy admission and potential eventual transfer; no new progress    All of the above findings and recommendations were discussed with the patient, and the medical team, and all of patient's questions were answered to her expressed  satisfaction.  -- [name], PA-C [facility] 07/11/2022, 8:41 AM M-F: 7am - 4pm

## 2022-07-11 NOTE — Progress Notes (Signed)
PHARMACY - TOTAL PARENTERAL NUTRITION CONSULT NOTE   Indication: Prolonged ileus  Patient Measurements: Height: '4\' 11"'$  (149.9 cm) Weight: 96.6 kg (212 lb 15.4 oz) IBW/kg (Calculated) : 43.2 TPN AdjBW (KG): 55.2 Body mass index is 43.01 kg/m.  Assessment: Debra [name] is a 56 y.o. female s/p laparotomy, excision of greater omental mass, abdominal wall reconstruction with [name], appendectomy, and placement of Prevena vac.  Glucose / Insulin: No apparent history of diabetes. BG have been stable and at goal with SSI 4x/day + 10u insulin in TPN. Electrolytes: Within normal limits Renal: Scr < 1 Hepatic: No transaminitis. LFTs within normal limits. TG within normal limits GI Imaging: 9/11 CTAP: no new acute issues GI Surgeries / Procedures: s/p laparotomy, excision of greater omental mass, abdominal wall reconstruction with [name], appendectomy, and placement of Prevena vac  Central access: 02/15/22 TPN start date: 02/15/22  Nutritional Goals: Goal cyclic TPN over 16 hrs: cyclic TPN over 16 hrs: (provides 102 g of protein and 1817 kcals per day) total volume over 16hrs for calculations=1400 ml(1500 ml total with overfill)  RD Assessment:  Estimated Needs Total Energy Estimated Needs: 1800-2100kcal/day Total Protein Estimated Needs: 90-110g/d Total Fluid Estimated Needs: 1.4-1.6L/day  Current Nutrition:  Soft diet + nutritional supplements, not meeting PO needs yet  Patient walks around in afternoon/evening  Plan: Transitioned to *Cyclic* TPN on 1/61.   to run over 16 hours per MD request. [name] at 2000 per discussion with Dietician to allow patient time for walking in afternoon/evenings.  To run over 16 hours: -Start rate at 49 mL/hr for 1 hour. -Increase rate to 98 mL/hr for 13 hours.  -Decrease rate to 49 mL/hr for 1 hour. -Decrease rate to 25 mL/hr for 1 hours, then stop.  Nutritional Components Amino acids (using 15% Clinisol): 102  g Dextrose 19% = 266 g Lipids (using 20% SMOFlipids): 50.4 g kCal: 1817/24h  Electrolytes in TPN (standard): Na 66mq/L, K 562m/L, Ca 64m70mL, Mg 10 mEq/L (max recommended), Phos 10 mmol/L. Cl:Ac 1:1 Add MVI, trace elements, and chromium to TPN  Continue CBG/SSI for Cyclic TPN: SSI  -CBG 2 hrs after cyclic TPN start -CBG during middle of cyclic TPN infusion -CBG 1 hr after cylic TPN stopped -CBG while off TPN (=4 CBGs per 24 hr period) SSI moderate + 10u insulin in TPN Check TPN labs on Mon/Thurs at minimum Monitor for further need of diuresis and adjust as needed for fluid status Continues Lasix 20 mg PO BID added 10/13  [name]/11/2021 10:14 AM

## 2022-07-11 NOTE — Consult Note (Addendum)
[place] Nurse wound follow up Pouch was changed yesterday (11/2). Current [name] pouch with window is intact with good seal over abd full thickness wound with fistulas; mod amt brown liquid. Nurses performing intermittent suction by inserting yankour periodically. Supplies at bedside and instructions have been provided if they need to change the pouch PRN if leaking. 3 sets of large [name] pouches with a window ordered to the room for staff nurse's use; [facility] # [phone]. Thank-you,  Julien Girt MSN, [place], [place], [place], [place]

## 2022-07-12 MED ORDER — TRACE MINERALS CU-MN-SE-ZN 300-55-60-3000 MCG/ML IV SOLN
INTRAVENOUS | Status: AC
  Filled 2022-07-12: qty 681.33

## 2022-07-12 NOTE — Progress Notes (Signed)
Patient ID: Debra [name], female   DOB: [birth year], 56 y.o.   MRN: [id number]     [place] Hospital Day(s): 149.   Interval History: Patient seen and examined.  She has been complaining of nausea in the last few days.  She had a new episode of vomiting this morning.  She is not feeling well.  She has not been able to get out of bed in the last few days.  Patient endorses that the reason to not getting up is urinary incontinence.  Also complaining of back pain.  Vital signs in last 24 hours: [min-max] current  Temp:  [97.5 F (36.4 C)-98.1 F (36.7 C)] 98 F (36.7 C) (11/04 0742) Pulse Rate:  [85-90] 87 (11/04 0742) Resp:  [16-18] 18 (11/04 0742) BP: (127-138)/(75-81) 138/75 (11/04 0742) SpO2:  [91 %-96 %] 93 % (11/04 0742)     Height: '4\' 11"'$  (149.9 cm) Weight: 96.6 kg BMI (Calculated): 43.04   Physical Exam:  Constitutional: alert, cooperative  Respiratory: breathing non-labored at rest  Cardiovascular: regular rate and sinus rhythm  Gastrointestinal: soft, midline open wound with granulation tissue around the fistula.  This is covered by [facility] pouch  Labs:     Latest Ref Rng & Units 07/02/2022   10:13 AM 06/17/2022    5:03 AM 06/16/2022    8:33 AM  CBC  WBC 4.0 - 10.5 K/uL 3.7  2.7  2.5   Hemoglobin 12.0 - 15.0 g/dL 10.2  8.1  6.8   Hematocrit 36.0 - 46.0 % 32.6  25.6  21.8   Platelets 150 - 400 K/uL 84  69  65       Latest Ref Rng & Units 07/10/2022   11:53 AM 07/07/2022    9:36 AM 07/03/2022    7:55 AM  CMP  Glucose 70 - 99 mg/dL 177  176  180   BUN 6 - 20 mg/dL 37  27  26   Creatinine 0.44 - 1.00 mg/dL 0.81  0.50  0.50   Sodium 135 - 145 mmol/L 135  135  136   Potassium 3.5 - 5.1 mmol/L 3.5  4.0  3.6   Chloride 98 - 111 mmol/L 104  103  102   CO2 22 - 32 mmol/L '24  27  28   '$ Calcium 8.9 - 10.3 mg/dL 8.8  8.7  8.5   Total Protein 6.5 - 8.1 g/dL 7.4  7.5  6.9   Total Bilirubin 0.3 - 1.2 mg/dL 0.5  0.7  0.7   Alkaline Phos 38 - 126 U/L 136  130   137   AST 15 - 41 U/L 43  46  39   ALT 0 - 44 U/L '28  27  24     '$ Imaging studies: No new pertinent imaging studies   Assessment/Plan:  56 y.o. female with metastatic colon cancer s/p omentectomy and incisional hernia repair 147 Days Post-Op, complicated by pertinent comorbidities including enterocutaneous fistula.   Patient with adequate vital signs, no fever. -Today feeling nauseous with an episode of vomiting.  Will treat symptomatically. -I am not concerned about infectious process since there has been no fever.  The wound looks unchanged. -Patient is having usual output through the fistula. -We will continue TPN -We will reconsult PT due to new onset back pain and weakness.  [name], MD

## 2022-07-12 NOTE — Progress Notes (Signed)
PHARMACY - TOTAL PARENTERAL NUTRITION CONSULT NOTE   Indication: Prolonged ileus  Patient Measurements: Height: '4\' 11"'$  (149.9 cm) Weight: 96.6 kg (212 lb 15.4 oz) IBW/kg (Calculated) : 43.2 TPN AdjBW (KG): 55.2 Body mass index is 43.01 kg/m.  Assessment: Debra [name] is [name] 56 y.o. female s/p laparotomy, excision of greater omental mass, abdominal wall reconstruction with Debra [name], appendectomy, and placement of Prevena vac.  Glucose / Insulin: No apparent history of diabetes.  BG have been stable and at goal with SSI 4x/day + 10u insulin in TPN. Electrolytes: Within normal limits Renal: Scr < 1 Hepatic: No transaminitis. LFTs within normal limits. TG within normal limits GI Imaging: 9/11 CTAP: no new acute issues GI Surgeries / Procedures: s/p laparotomy, excision of greater omental mass, abdominal wall reconstruction with Debra [name], appendectomy, and placement of Prevena vac  Central access: 02/15/22 TPN start date: 02/15/22  Nutritional Goals: Goal cyclic TPN over 16 hrs: cyclic TPN over 16 hrs: (provides 102 g of protein and 1817 kcals per day) total volume over 16hrs for calculations=1400 ml(1500 ml total with overfill)  RD Assessment:  Estimated Needs Total Energy Estimated Needs: 1800-2100kcal/day Total Protein Estimated Needs: 90-110g/d Total Fluid Estimated Needs: 1.4-1.6L/day  Current Nutrition:  Soft diet + nutritional supplements, not meeting PO needs yet  Patient walks around in afternoon/evening On loperamide q6h scheduled started 7/18  Plan: Transitioned to *Cyclic* TPN on 2/63.   to run over 16 hours per MD request. [name] at 2000 per discussion with Dietician to allow patient time for walking in afternoon/evenings.  To run over 16 hours: -Start rate at 49 mL/hr for 1 hour. -Increase rate to 98 mL/hr for 13 hours.  -Decrease rate to 49 mL/hr for 1 hour. -Decrease rate to 25 mL/hr for 1 hours, then stop.  Nutritional  Components Amino acids (using 15% Clinisol): 102 g Dextrose 19% = 266 g Lipids (using 20% SMOFlipids): 50.4 g kCal: 1817/24h  Electrolytes in TPN (standard): Na 57mq/L, K 517m/L, Ca 28m53mL, Mg 10 mEq/L, Phos 10 mmol/L. Cl:Ac 1:1 Add MVI, trace elements, and chromium to TPN  Continue CBG/SSI for Cyclic TPN: SSI  -CBG 2 hrs after cyclic TPN start -CBG during middle of cyclic TPN infusion -CBG 1 hr after cylic TPN stopped -CBG while off TPN (=4 CBGs per 24 hr period) SSI moderate + 10u insulin in TPN Check TPN labs on Mon/Thurs at minimum Monitor for further need of diuresis and adjust as needed for fluid status Continues Lasix 20 mg PO BID added 10/13  Debra [name] 07/12/2022 8:38 AM

## 2022-07-12 NOTE — Plan of Care (Signed)
  Problem: Nutrition: Goal: Adequate nutrition will be maintained Outcome: Not Progressing   Problem: Activity: Goal: Risk for activity intolerance will decrease Outcome: Not Progressing   Problem: Nutrition: Goal: Adequate nutrition will be maintained Outcome: Not Progressing   Problem: Pain Managment: Goal: General experience of comfort will improve Outcome: Not Progressing

## 2022-07-12 NOTE — Progress Notes (Addendum)
Patient's nausea has improved some, but intake still poor. Patient states when eating, "food does not set well in her stomach." AM oral medications were not given due to her vomiting episode, however RN was able to encourage patient to take her sucralfate hoping it will help with eating. RN tried to have patient sit on side of bed, but patient was not able, due to pain. Patient tearful and appears to be fatigued today. RN offered pain medication, but patient refused.   Fuller Mandril, RN

## 2022-07-12 NOTE — Progress Notes (Signed)
Patient vomited 100 mLs clear liquid. Patient given IV zofran. Will hold PO meds for now. MD aware.   Fuller Mandril, RN

## 2022-07-13 LAB — CBC WITH DIFFERENTIAL/PLATELET
Abs Immature Granulocytes: 0.02 10*3/uL (ref 0.00–0.07)
Basophils Absolute: 0 10*3/uL (ref 0.0–0.1)
Basophils Relative: 0 %
Eosinophils Absolute: 0.1 10*3/uL (ref 0.0–0.5)
Eosinophils Relative: 2 %
HCT: 31.1 % — ABNORMAL LOW (ref 36.0–46.0)
Hemoglobin: 9.9 g/dL — ABNORMAL LOW (ref 12.0–15.0)
Immature Granulocytes: 0 %
Lymphocytes Relative: 15 %
Lymphs Abs: 0.7 10*3/uL (ref 0.7–4.0)
MCH: 28 pg (ref 26.0–34.0)
MCHC: 31.8 g/dL (ref 30.0–36.0)
MCV: 87.9 fL (ref 80.0–100.0)
Monocytes Absolute: 0.6 10*3/uL (ref 0.1–1.0)
Monocytes Relative: 12 %
Neutro Abs: 3.2 10*3/uL (ref 1.7–7.7)
Neutrophils Relative %: 71 %
Platelets: 115 10*3/uL — ABNORMAL LOW (ref 150–400)
RBC: 3.54 MIL/uL — ABNORMAL LOW (ref 3.87–5.11)
RDW: 20.9 % — ABNORMAL HIGH (ref 11.5–15.5)
WBC: 4.5 10*3/uL (ref 4.0–10.5)
nRBC: 0 % (ref 0.0–0.2)

## 2022-07-13 LAB — GLUCOSE, CAPILLARY
Glucose-Capillary: 153 mg/dL — ABNORMAL HIGH (ref 70–99)
Glucose-Capillary: 181 mg/dL — ABNORMAL HIGH (ref 70–99)

## 2022-07-13 MED ORDER — TRACE MINERALS CU-MN-SE-ZN 300-55-60-3000 MCG/ML IV SOLN
INTRAVENOUS | Status: AC
  Filled 2022-07-13: qty 681.33

## 2022-07-13 NOTE — Progress Notes (Signed)
   07/13/22 0500  Provider Notification  Provider Name/Title Dr Sidney Ace  Date Provider Notified 07/13/22  Time Provider Notified [phone]  Method of Notification Page  Notification Reason Other (Comment) (requesting a CBC due to blood from eakin pouch)  Provider response See new orders (cbc ordered)  Date of Provider Response 07/13/22  Time of Provider Response 0500

## 2022-07-13 NOTE — Evaluation (Addendum)
Physical Therapy re-Evaluation Patient Details Name: Debra [name] MRN: [id number] DOB: 1965/10/23 Today's Date: 07/13/2022  History of Present Illness  Pt is a 56 y.o. female with PMH including hypothyroidism, iron deficient anemia, colon cancer.  Here s/p mass removal with hernia repair and appendectomy 6/8, bowel perforation repair 6/10. [name] pouch was initially placed 6/20 and changed on 7/10. Re-evaluation consult placed as pt with acute back pain and muscle spasms, not able to get OOB in 3 days. Pt is POD 148.  Clinical Impression  Pt is a pleasant 56 year old female who was admitted for initial hernia repair and appendectomy complicated by wound care. Re-evaluation performed this date as pt previously indep with all mobility in hallway. Pt performs bed mobility with mod assist, transfers with min assist, and side step ambulation with min assist and use of RW. Pt demonstrates deficits with strength/mobility/pain. Reports increased/severe pain with all mobility. Heavy pain education given along with encouragement for increased mobility with RN staff. Encouraged to stay at EOB at end of session. Will continue to time sessions with pain medicine and add OT support for supportive ADL training. Would benefit from skilled PT to address above deficits and promote optimal return to PLOF. Recommend transition to [place] upon discharge from acute hospitalization. Reached out via secure chat for use of heating pad for muscle spasms.      Recommendations for follow up therapy are one component of a multi-disciplinary discharge planning process, led by the attending physician.  Recommendations may be updated based on patient status, additional functional criteria and insurance authorization.  Follow Up Recommendations Home health PT Can patient physically be transported by private vehicle: No    Assistance Recommended at Discharge Set up Supervision/Assistance  Patient can return home with the  following  A lot of help with walking and/or transfers;A lot of help with bathing/dressing/bathroom    Equipment Recommendations Rolling walker (2 wheels);BSC/3in1  Recommendations for Other Services       Functional Status Assessment Patient has had a recent decline in their functional status and demonstrates the ability to make significant improvements in function in a reasonable and predictable amount of time.     Precautions / Restrictions Precautions Precautions: Fall Restrictions Weight Bearing Restrictions: No Other Position/Activity Restrictions: Abdominal surgery/[name] pouch      Mobility  Bed Mobility Overal bed mobility: Needs Assistance Bed Mobility: Supine to Sit     Supine to sit: Mod assist     General bed mobility comments: very slow and effortful movement to EOB. Needs assist for trunk secondary to pain. Once seated, able to progress to supervision    Transfers Overall transfer level: Needs assistance Equipment used: Rolling walker (2 wheels) Transfers: Sit to/from Stand Sit to Stand: Min assist           General transfer comment: able to stand with extremely slow technique with heavy encouragement. Tends to hold breath secondary to pain.    Ambulation/Gait Ambulation/Gait assistance: Min assist Gait Distance (Feet): 2 Feet Assistive device: Rolling walker (2 wheels) Gait Pattern/deviations: Step-to pattern       General Gait Details: step to gait pattern with very limited steps with sidestepping and using RW. very limited by pain and heavy cues required  Stairs            Wheelchair Mobility    Modified Rankin (Stroke Patients Only)       Balance Overall balance assessment: Needs assistance Sitting-balance support: Feet supported Sitting balance-Leahy Scale: Good  Standing balance support: Bilateral upper extremity supported, During functional activity Standing balance-Leahy Scale: Fair                                Pertinent Vitals/Pain Pain Assessment Pain Assessment: 0-10 Pain Score: 8  Pain Location: all over her back Pain Descriptors / Indicators: Grimacing, Discomfort Pain Intervention(s): Limited activity within patient's tolerance, Premedicated before session, Repositioned    Home Living Family/patient expects to be discharged to:: Private residence Living Arrangements: Children Available Help at Discharge: Available 24 hours/day;Family Type of Home: House Home Access: Level entry     Alternate Level Stairs-Number of Steps: flight Home Layout: Two level;Bed/bath upstairs Home Equipment: None      Prior Function Prior Level of Function : Independent/Modified Independent             Mobility Comments: previously indep throughout hospital stay using RW or IV pole for mobility. Hasn't ambulated in over 3 days ADLs Comments: was performing sink baths     Hand Dominance        Extremity/Trunk Assessment   Upper Extremity Assessment Upper Extremity Assessment: Overall WFL for tasks assessed    Lower Extremity Assessment Lower Extremity Assessment: Generalized weakness       Communication   Communication: Prefers language other than English;Interpreter utilized (in house spanish interpreter)  Cognition Arousal/Alertness: Awake/alert Behavior During Therapy: WFL for tasks assessed/performed, Flat affect Overall Cognitive Status: Within Functional Limits for tasks assessed                                 General Comments: anxious for all mobility        General Comments      Exercises Other Exercises Other Exercises: stood at bedside for prolonged time to get used to standing. Postural breathing techniques performed for pain control. RW used and cga. Pt crying in pain despite being premedicated   Assessment/Plan    PT Assessment Patient needs continued PT services  PT Problem List Decreased strength;Decreased activity tolerance;Decreased  balance;Decreased mobility;Pain;Obesity       PT Treatment Interventions DME instruction;Gait training    PT Goals (Current goals can be found in the Care Plan section)  Acute Rehab PT Goals Patient Stated Goal: to go home PT Goal Formulation: With patient Time For Goal Achievement: 07/27/22 Potential to Achieve Goals: Good    Frequency Min 2X/week     Co-evaluation               AM-PAC PT "6 Clicks" Mobility  Outcome Measure Help needed turning from your back to your side while in a flat bed without using bedrails?: A Little Help needed moving from lying on your back to sitting on the side of a flat bed without using bedrails?: A Lot Help needed moving to and from a bed to a chair (including a wheelchair)?: A Lot Help needed standing up from a chair using your arms (e.g., wheelchair or bedside chair)?: A Lot Help needed to walk in hospital room?: A Little Help needed climbing 3-5 steps with a railing? : A Lot 6 Click Score: 14    End of Session   Activity Tolerance: Patient limited by pain Patient left: in bed (seated at EOB) Nurse Communication: Mobility status PT Visit Diagnosis: Muscle weakness (generalized) (M62.81);Pain Pain - Right/Left:  (bilat) Pain - part of body:  (back)  Time: 9166-0600 PT Time Calculation (min) (ACUTE ONLY): 27 min   Charges:   PT Evaluation $PT Re-evaluation: 1 Re-eval PT Treatments $Therapeutic Activity: 8-22 mins        Greggory Stallion, PT, DPT, [facility] [phone]   Kiyo Heal 07/13/2022, 1:43 PM

## 2022-07-13 NOTE — Progress Notes (Signed)
Patient ID: Debra [name], female   DOB: [birth year], 55 y.o.   MRN: [id number]     [place] Hospital Day(s): 150.   Interval History: Patient seen and examined, no acute events or new complaints overnight. Patient reports feeling better this morning.  She endorses that yesterday she was nauseous but this morning he does not feel nauseous right now.  Still with low appetite.  Patient had blood in the [name] pouch.  I opened the vent and irrigated the wound with saline.  No source of active bleeding at this moment.  Vital signs in last 24 hours: [min-max] current  Temp:  [97.9 F (36.6 C)-98.5 F (36.9 C)] 97.9 F (36.6 C) (11/05 0740) Pulse Rate:  [79-86] 79 (11/05 0740) Resp:  [16-18] 18 (11/05 0740) BP: (120-137)/(71-77) 120/71 (11/05 0740) SpO2:  [93 %-96 %] 95 % (11/05 0740) Weight:  [97.7 kg] 97.7 kg (11/05 0439)     Height: '4\' 11"'$  (149.9 cm) Weight: 97.7 kg BMI (Calculated): 43.04   Physical Exam:  Constitutional: alert, cooperative and no distress  Respiratory: breathing non-labored at rest  Cardiovascular: regular rate and sinus rhythm  Gastrointestinal: soft, non-tender, and non-distended.  Midline open wound with multiple stomas.  No bleeding.  Labs:     Latest Ref Rng & Units 07/13/2022    7:05 AM 07/02/2022   10:13 AM 06/17/2022    5:03 AM  CBC  WBC 4.0 - 10.5 K/uL 4.5  3.7  2.7   Hemoglobin 12.0 - 15.0 g/dL 9.9  10.2  8.1   Hematocrit 36.0 - 46.0 % 31.1  32.6  25.6   Platelets 150 - 400 K/uL 115  84  69       Latest Ref Rng & Units 07/10/2022   11:53 AM 07/07/2022    9:36 AM 07/03/2022    7:55 AM  CMP  Glucose 70 - 99 mg/dL 177  176  180   BUN 6 - 20 mg/dL 37  27  26   Creatinine 0.44 - 1.00 mg/dL 0.81  0.50  0.50   Sodium 135 - 145 mmol/L 135  135  136   Potassium 3.5 - 5.1 mmol/L 3.5  4.0  3.6   Chloride 98 - 111 mmol/L 104  103  102   CO2 22 - 32 mmol/L '24  27  28   '$ Calcium 8.9 - 10.3 mg/dL 8.8  8.7  8.5   Total Protein 6.5 - 8.1  g/dL 7.4  7.5  6.9   Total Bilirubin 0.3 - 1.2 mg/dL 0.5  0.7  0.7   Alkaline Phos 38 - 126 U/L 136  130  137   AST 15 - 41 U/L 43  46  39   ALT 0 - 44 U/L '28  27  24     '$ Imaging studies: No new pertinent imaging studies   Assessment/Plan:  56 y.o. female with metastatic colon cancer s/p omentectomy and incisional hernia repair 148 Days Post-Op, complicated by pertinent comorbidities including enterocutaneous fistula.    -The event yesterday with nausea and vomiting.  Improved this morning with nausea medications -Still with decreased appetite.  We will continue with supplemental TPN -I cleaned the wound through the pain and there was no source of active bleeding at this moment.  Hemoglobin stable.  No concern of active bleeding.  We will continue monitoring clinically -I consulted PT due to new onset weakness from back pain.  She was able to sit down yesterday.  [name], MD

## 2022-07-13 NOTE — Progress Notes (Signed)
   07/12/22 2006  Provider Notification  Provider Name/Title Dr. Kerrin Mo  Date Provider Notified 07/12/22  Time Provider Notified 2005  Method of Notification Page  Notification Reason Change in status (patient's eakin pouch with some blood in it)

## 2022-07-13 NOTE — Progress Notes (Signed)
PHARMACY - TOTAL PARENTERAL NUTRITION CONSULT NOTE   Indication: Prolonged ileus  Patient Measurements: Height: '4\' 11"'$  (149.9 cm) Weight: 97.7 kg (215 lb 6.2 oz) IBW/kg (Calculated) : 43.2 TPN AdjBW (KG): 55.2 Body mass index is 43.5 kg/m.  Assessment: Debra Debra [name] is Debra [name] 56 y.o. female s/p laparotomy, excision of greater omental mass, abdominal wall reconstruction with Debra Debra [name] release, appendectomy, and placement of Prevena vac.  Glucose / Insulin: No apparent history of diabetes.  BG have been stable and at goal with SSI 4x/day + 10u insulin in TPN. 15 units SSI/24 hrs. This has increased-monitor  Electrolytes: Within normal limits Renal: Scr < 1 Hepatic: No transaminitis. LFTs within normal limits. TG within normal limits GI Imaging: 9/11 CTAP: no new acute issues GI Surgeries / Procedures: s/p laparotomy, excision of greater omental mass, abdominal wall reconstruction with Debra Debra [name] release, appendectomy, and placement of Prevena vac  Central access: 02/15/22 TPN start date: 02/15/22  Nutritional Goals: Goal cyclic TPN over 16 hrs: cyclic TPN over 16 hrs: (provides 102 g of protein and 1817 kcals per day) total volume over 16hrs for calculations=1400 ml(1500 ml total with overfill)  RD Assessment:  Estimated Needs Total Energy Estimated Needs: 1800-2100kcal/day Total Protein Estimated Needs: 90-110g/d Total Fluid Estimated Needs: 1.4-1.6L/day  Current Nutrition:  Soft diet + nutritional supplements, not meeting PO needs yet  Patient walks around in afternoon/evening On loperamide q6h scheduled started 7/18  Plan: Transitioned to *Cyclic* TPN on 0/96.   to run over 16 hours per MD request. Debra Debra [name] at 2000 per discussion with Dietician to allow patient time for walking in afternoon/evenings.  To run over 16 hours: -Start rate at 49 mL/hr for 1 hour. -Increase rate to 98 mL/hr for 13 hours.  -Decrease rate to 49 mL/hr for 1 hour. -Decrease rate to 25 mL/hr  for 1 hours, then stop.  Nutritional Components Amino acids (using 15% Clinisol): 102 g Dextrose 19% = 266 g Lipids (using 20% SMOFlipids): 50.4 g kCal: 1817/24h  Electrolytes in TPN (standard): Na 71mq/L, K 524m/L, Ca 15m115mL, Mg 10 mEq/L, Phos 10 mmol/L. Cl:Ac 1:1 Add MVI, trace elements, and chromium to TPN  Continue CBG/SSI for Cyclic TPN: SSI  -CBG 2 hrs after cyclic TPN start -CBG during middle of cyclic TPN infusion -CBG 1 hr after cylic TPN stopped -CBG while off TPN (=4 CBGs per 24 hr period) SSI moderate + 10u insulin in TPN Check TPN labs on Mon/Thurs at minimum Monitor for further need of diuresis and adjust as needed for fluid status Continues Lasix 20 mg PO BID added 10/13  Debra Debra [name] 07/13/2022 8:11 AM

## 2022-07-14 ENCOUNTER — Inpatient Hospital Stay: Payer: Medicaid Other

## 2022-07-14 LAB — GLUCOSE, CAPILLARY
Glucose-Capillary: 210 mg/dL — ABNORMAL HIGH (ref 70–99)
Glucose-Capillary: 207 mg/dL — ABNORMAL HIGH (ref 70–99)
Glucose-Capillary: 106 mg/dL — ABNORMAL HIGH (ref 70–99)
Glucose-Capillary: 215 mg/dL — ABNORMAL HIGH (ref 70–99)
Glucose-Capillary: 211 mg/dL — ABNORMAL HIGH (ref 70–99)
Glucose-Capillary: 201 mg/dL — ABNORMAL HIGH (ref 70–99)
Glucose-Capillary: 161 mg/dL — ABNORMAL HIGH (ref 70–99)
Glucose-Capillary: 104 mg/dL — ABNORMAL HIGH (ref 70–99)
Glucose-Capillary: 100 mg/dL — ABNORMAL HIGH (ref 70–99)
Glucose-Capillary: 82 mg/dL (ref 70–99)
Glucose-Capillary: 101 mg/dL — ABNORMAL HIGH (ref 70–99)

## 2022-07-14 LAB — MISC LABCORP TEST (SEND OUT)
LabCorp test name: 81034
Labcorp test code: 81034

## 2022-07-14 LAB — COMPREHENSIVE METABOLIC PANEL
ALT: 38 U/L (ref 0–44)
AST: 42 U/L — ABNORMAL HIGH (ref 15–41)
Albumin: 3 g/dL — ABNORMAL LOW (ref 3.5–5.0)
Alkaline Phosphatase: 161 U/L — ABNORMAL HIGH (ref 38–126)
Anion gap: 7 (ref 5–15)
BUN: 54 mg/dL — ABNORMAL HIGH (ref 6–20)
CO2: 27 mmol/L (ref 22–32)
Calcium: 9.1 mg/dL (ref 8.9–10.3)
Chloride: 109 mmol/L (ref 98–111)
Creatinine, Ser: 1.3 mg/dL — ABNORMAL HIGH (ref 0.44–1.00)
GFR, Estimated: 48 mL/min — ABNORMAL LOW (ref >60)
Glucose, Bld: 128 mg/dL — ABNORMAL HIGH (ref 70–99)
Potassium: 3 mmol/L — ABNORMAL LOW (ref 3.5–5.1)
Sodium: 143 mmol/L (ref 135–145)
Total Bilirubin: 1.2 mg/dL (ref 0.3–1.2)
Total Protein: 7.3 g/dL (ref 6.5–8.1)

## 2022-07-14 LAB — PHOSPHORUS: Phosphorus: 5.6 mg/dL — ABNORMAL HIGH (ref 2.5–4.6)

## 2022-07-14 LAB — MAGNESIUM: Magnesium: 2.1 mg/dL (ref 1.7–2.4)

## 2022-07-14 MED ORDER — GADOBUTROL 1 MMOL/ML IV SOLN
9.0000 mL | Freq: Once | INTRAVENOUS | Status: AC | PRN
  Administered 2022-07-14: 9 mL via INTRAVENOUS

## 2022-07-14 MED ORDER — INSULIN ASPART 100 UNIT/ML IJ SOLN
0.0000 [IU] | INTRAMUSCULAR | Status: DC
  Administered 2022-07-14 – 2022-07-15 (×2): 3 [IU] via SUBCUTANEOUS
  Administered 2022-07-15: 5 [IU] via SUBCUTANEOUS
  Administered 2022-07-15: 2 [IU] via SUBCUTANEOUS
  Administered 2022-07-16: 3 [IU] via SUBCUTANEOUS
  Administered 2022-07-16: 5 [IU] via SUBCUTANEOUS
  Administered 2022-07-16: 2 [IU] via SUBCUTANEOUS
  Administered 2022-07-16 – 2022-07-17 (×2): 3 [IU] via SUBCUTANEOUS
  Administered 2022-07-17: 5 [IU] via SUBCUTANEOUS
  Administered 2022-07-17: 3 [IU] via SUBCUTANEOUS
  Administered 2022-07-17: 5 [IU] via SUBCUTANEOUS
  Administered 2022-07-18: 3 [IU] via SUBCUTANEOUS
  Administered 2022-07-18: 5 [IU] via SUBCUTANEOUS
  Administered 2022-07-18 – 2022-07-19 (×6): 3 [IU] via SUBCUTANEOUS
  Administered 2022-07-20: 5 [IU] via SUBCUTANEOUS
  Administered 2022-07-20 (×3): 2 [IU] via SUBCUTANEOUS
  Administered 2022-07-21: 5 [IU] via SUBCUTANEOUS
  Filled 2022-07-14 (×25): qty 1

## 2022-07-14 MED ORDER — TRACE MINERALS CU-MN-SE-ZN 300-55-60-3000 MCG/ML IV SOLN
INTRAVENOUS | Status: DC
  Filled 2022-07-14: qty 681.33

## 2022-07-14 MED ORDER — TRACE MINERALS CU-MN-SE-ZN 300-55-60-3000 MCG/ML IV SOLN
INTRAVENOUS | Status: AC
  Filled 2022-07-14: qty 681.33

## 2022-07-14 NOTE — Evaluation (Signed)
Occupational Therapy Evaluation Patient Details Name: Debra [name] MRN: [id number] DOB: 1966-04-25 Today's Date: 07/14/2022   History of Present Illness Pt is a 56 y.o. female with PMH including hypothyroidism, iron deficient anemia, colon cancer.  Here s/p mass removal with hernia repair and appendectomy 6/8, bowel perforation repair 6/10. [name] pouch was initially placed 6/20 and changed on 7/10. Re-evaluation consult placed as pt with acute back pain and muscle spasms, not able to get OOB in 3 days. Pt is POD 148.   Clinical Impression   Patient presenting with decreased independence in self-care, functional mobility, safety awareness, balance, strength, and endurance. Spanish interpretor utilized during session. Patient lives at home with her children. At baseline patient is independent. Patient currently functioning at max A for rolling>sidelying. Patient unable to follow any commands throughout session with decreased safety awareness. Required total A to transfer from sidelying >sit EOB. Patient observed sliding forwards while sitting EOB, requiring max VC due to poor safety awareness. Patient placed back in supine with total A for LE and trunk secondary to pain. Abdominal bleeding observed, nurse aware/present in room. Patient left in bed with call bell inreach, bed alarm set, and all needs met. Patient will benefit from acute OT to increase overall independence in the areas of ADLs, functional mobility, in order to safely discharge home.       Recommendations for follow up therapy are one component of a multi-disciplinary discharge planning process, led by the attending physician.  Recommendations may be updated based on patient status, additional functional criteria and insurance authorization.   Follow Up Recommendations  Home health OT    Assistance Recommended at Discharge Frequent or constant Supervision/Assistance  Patient can return home with the following Assistance with  cooking/housework;Assist for transportation;A little help with walking and/or transfers;A little help with bathing/dressing/bathroom    Functional Status Assessment  Patient has had a recent decline in their functional status and demonstrates the ability to make significant improvements in function in a reasonable and predictable amount of time.  Equipment Recommendations  None recommended by OT    Recommendations for Other Services       Precautions / Restrictions Precautions Precautions: Fall Restrictions Weight Bearing Restrictions: No Other Position/Activity Restrictions: Abdominal surgery/[name] pouch      Mobility Bed Mobility   Bed Mobility: Rolling, Sidelying to Sit, Sit to Sidelying Rolling: Max assist Sidelying to sit: Total assist     Sit to sidelying: Total assist General bed mobility comments: patient unable to follow any commands, required assistance for LE and truck secondary to pain.    Transfers                          Balance Overall balance assessment: Needs assistance Sitting-balance support: Feet supported Sitting balance-Leahy Scale: Poor                                     ADL either performed or assessed with clinical judgement   ADL                                               Vision         Perception     Praxis      Pertinent Vitals/Pain Pain Assessment  Pain Assessment: Faces Faces Pain Scale: Hurts whole lot Pain Location: back, left arm Pain Descriptors / Indicators: Grimacing, Discomfort Pain Intervention(s): RN gave pain meds during session, Limited activity within patient's tolerance, Monitored during session, Repositioned     Hand Dominance     Extremity/Trunk Assessment Upper Extremity Assessment Upper Extremity Assessment: Generalized weakness   Lower Extremity Assessment Lower Extremity Assessment: Generalized weakness       Communication  Communication Communication: Prefers language other than English;Interpreter utilized   Cognition Arousal/Alertness: Awake/alert Behavior During Therapy: WFL for tasks assessed/performed Overall Cognitive Status: No family/caregiver present to determine baseline cognitive functioning Area of Impairment: Safety/judgement, Following commands, Awareness                       Following Commands: Follows one step commands inconsistently Safety/Judgement: Decreased awareness of safety, Decreased awareness of deficits Awareness: Intellectual         General Comments       Exercises     Shoulder Instructions      Home Living Family/patient expects to be discharged to:: Private residence Living Arrangements: Children Available Help at Discharge: Available 24 hours/day;Family Type of Home: House Home Access: Level entry     Home Layout: Two level;Bed/bath upstairs Alternate Level Stairs-Number of Steps: flight             Home Equipment: None          Prior Functioning/Environment Prior Level of Function : Independent/Modified Independent             Mobility Comments: previously indep throughout hospital stay using RW or IV pole for mobility. Hasn't ambulated in over 3 days ADLs Comments: was performing sink baths        OT Problem List: Decreased strength;Decreased range of motion;Decreased activity tolerance;Impaired balance (sitting and/or standing);Pain      OT Treatment/Interventions: Self-care/ADL training;Therapeutic exercise;Energy conservation;DME and/or AE instruction;Therapeutic activities;Patient/family education;Balance training    OT Goals(Current goals can be found in the care plan section) Acute Rehab OT Goals Patient Stated Goal: to get better OT Goal Formulation: With patient Time For Goal Achievement: 07/28/22 Potential to Achieve Goals: Fair ADL Goals Pt Will Perform Lower Body Bathing: with modified independence;sit to/from  stand;with adaptive equipment Pt Will Perform Lower Body Dressing: with adaptive equipment;sit to/from stand;with modified independence Pt Will Transfer to Toilet: with modified independence;regular height toilet Pt Will Perform Toileting - Clothing Manipulation and hygiene: with modified independence  OT Frequency: Min 2X/week    Co-evaluation              AM-PAC OT "6 Clicks" Daily Activity     Outcome Measure Help from another person eating meals?: None Help from another person taking care of personal grooming?: A Little Help from another person toileting, which includes using toliet, bedpan, or urinal?: A Lot Help from another person bathing (including washing, rinsing, drying)?: A Lot Help from another person to put on and taking off regular upper body clothing?: A Little Help from another person to put on and taking off regular lower body clothing?: A Lot 6 Click Score: 16   End of Session Equipment Utilized During Treatment: Other (comment) (interpreter.) Nurse Communication: Mobility status  Activity Tolerance: Patient limited by pain Patient left: in bed;with call bell/phone within reach;with bed alarm set  OT Visit Diagnosis: Unsteadiness on feet (R26.81);Pain Pain - Right/Left: Left Pain - part of body: Arm (left arm and back)  Time: 1000-1024 OT Time Calculation (min): 24 min Charges:  OT General Charges $OT Visit: 1 Visit OT Evaluation $OT Eval Moderate Complexity: 1 Mod OT Treatments $Therapeutic Activity: 8-22 mins    Lakely Elmendorf, OTS 07/14/2022, 1:54 PM

## 2022-07-14 NOTE — Progress Notes (Signed)
PT Cancellation Note  Patient Details Name: Debra [name] MRN: [id number] DOB: 1966/06/18   Cancelled Treatment:     Pt in severe pain per RN after OT session. Unable to participate this AM. Will re-attempt another time.   Debra [name] 07/14/2022, 11:18 AM Greggory Stallion, PT, DPT, GCS [phone]

## 2022-07-14 NOTE — Progress Notes (Signed)
PHARMACY - TOTAL PARENTERAL NUTRITION CONSULT NOTE   Indication: Prolonged ileus  Patient Measurements: Height: '4\' 11"'$  (149.9 cm) Weight: 95 kg (209 lb 7 oz) IBW/kg (Calculated) : 43.2 TPN AdjBW (KG): 55.2 Body mass index is 42.3 kg/m.  Assessment: Debra [name] is Debra 56 y.o. female s/p laparotomy, excision of greater omental mass, abdominal wall reconstruction with Debra [name] release, appendectomy, and placement of Prevena vac.  Glucose / Insulin: No apparent history of diabetes.  BG have been stable and at goal with SSI 4x/day + 10u insulin in TPN. 10 units SSI/24 hrs. This has increased-monitor  Electrolytes: Within normal limits Renal: Scr < 1 Hepatic: No transaminitis. LFTs within normal limits. TG within normal limits GI Imaging: 9/11 CTAP: no new acute issues GI Surgeries / Procedures: s/p laparotomy, excision of greater omental mass, abdominal wall reconstruction with Debra [name] release, appendectomy, and placement of Prevena vac  Central access: 02/15/22 TPN start date: 02/15/22  Nutritional Goals: Goal cyclic TPN over 16 hrs: cyclic TPN over 16 hrs: (provides 102 g of protein and 1817 kcals per day) total volume over 16hrs for calculations=1400 ml(1500 ml total with overfill)  RD Assessment:  Estimated Needs Total Energy Estimated Needs: 1800-2100kcal/day Total Protein Estimated Needs: 90-110g/d Total Fluid Estimated Needs: 1.4-1.6L/day  Current Nutrition:  Soft diet + nutritional supplements, not meeting PO needs yet  Patient walks around in afternoon/evening On loperamide q6h scheduled started 7/18  Plan: Transitioned to *Cyclic* TPN on 7/85.   to run over 16 hours per MD request. [name] at 2000 per discussion with Dietician to allow patient time for walking in afternoon/evenings.  To run over 16 hours: -Start rate at 49 mL/hr for 1 hour. -Increase rate to 98 mL/hr for 13 hours.  -Decrease rate to 49 mL/hr for 1 hour. -Decrease rate to 25 mL/hr for  1 hours, then stop.  Nutritional Components Amino acids (using 15% Clinisol): 102 g Dextrose 19% = 266 g Lipids (using 20% SMOFlipids): 50.4 g kCal: 1817/24h  Electrolytes in TPN (standard): Na 28mq/L, K 541m/L, Ca 46m46mL, Mg 10 mEq/L, Phos 10 mmol/L. Cl:Ac 1:1 Add MVI, trace elements, and chromium to TPN  Continue CBG/SSI for Cyclic TPN: SSI  -CBG 2 hrs after cyclic TPN start -CBG during middle of cyclic TPN infusion -CBG 1 hr after cylic TPN stopped -CBG while off TPN (=4 CBGs per 24 hr period) SSI moderate + 10u insulin in TPN Check TPN labs on Mon/Thurs at minimum(labs drawn after TPN needed to be ordered so no adjustments made. Will f/u tomorrow) Monitor for further need of diuresis and adjust as needed for fluid status Continues Lasix 20 mg PO BID added 10/13  Debra [name] Debra [name] 07/14/2022 9:27 AM

## 2022-07-14 NOTE — Progress Notes (Signed)
[facility] SURGICAL PROGRESS NOTE (cpt [phone])  Hospital Day(s): 151.   Post op day(s): 149 Days Post-Op.   Interval History:  Patient seen and examined Weekend events noted [name] intact; Managing effluent with intermittent suction [name] last changed on 11/02 Labs pending this morning  Hgb checked 11/05 secondary to clots in [name]; 9.9 (stable; previous was 10.2 on 10/25) She is on soft diet; on cyclic TPN More difficulty ambulating secondary to back pain; PT re-consulted; recommending HHPT  Still with back pain; intermittent improvement and flare ups; no lower extremity weakness, numbness, or urinary/bowel changes   Review of Systems:  Constitutional: denies fever, chills  HEENT: denies cough or congestion  Respiratory: denies any shortness of breath  Cardiovascular: denies chest pain or palpitations  Gastrointestinal: denies abdominal pain, N/V Genitourinary: denies burning with urination or urinary frequency Integumentary: + midline wound MSK: + Back Pain   Vital signs in last 24 hours: [min-max] current  Temp:  [97.9 F (36.6 C)-98.4 F (36.9 C)] 98 F (36.7 C) (11/06 0504) Pulse Rate:  [79-86] 84 (11/06 0504) Resp:  [16-20] 20 (11/06 0504) BP: (120-137)/(71-78) 137/73 (11/06 0504) SpO2:  [95 %-98 %] 97 % (11/06 0504) Weight:  [95 kg] 95 kg (11/06 0211)     Height: '4\' 11"'$  (149.9 cm) Weight: 95 kg BMI (Calculated): 43.04   Intake/Output last 2 shifts:  11/05 0701 - 11/06 0700 In: 711.8 [I.V.:711.8] Out: 8115 [Urine:1050; Drains:300]   Physical Exam:  Constitutional: alert, cooperative and no distress  Respiratory: breathing non-labored at rest  Cardiovascular: regular rate and sinus rhythm  Gastrointestinal: Soft, abdominal soreness on the right, non-distended, no rebound/guarding. Integumentary: Midline wound open, peritoneum closed; granulating; there are three stomatized areas visible in the LUQ and LLQ portions of the wound, output remains  feculent. There is hypergranulation tissue to the inferior wound edge.  Midline Wound (07/14/2022):    -- Wound measures approximately 11 x 12 cms -- The superior portion appears to have begun to granulate further, two somatized areas better isolated -- The inferior aspect is concerning for possible exposed loop of small bowel; no drainage in this area -- No bleeding   Labs:     Latest Ref Rng & Units 07/13/2022    7:05 AM 07/02/2022   10:13 AM 06/17/2022    5:03 AM  CBC  WBC 4.0 - 10.5 K/uL 4.5  3.7  2.7   Hemoglobin 12.0 - 15.0 g/dL 9.9  10.2  8.1   Hematocrit 36.0 - 46.0 % 31.1  32.6  25.6   Platelets 150 - 400 K/uL 115  84  69       Latest Ref Rng & Units 07/10/2022   11:53 AM 07/07/2022    9:36 AM 07/03/2022    7:55 AM  CMP  Glucose 70 - 99 mg/dL 177  176  180   BUN 6 - 20 mg/dL 37  27  26   Creatinine 0.44 - 1.00 mg/dL 0.81  0.50  0.50   Sodium 135 - 145 mmol/L 135  135  136   Potassium 3.5 - 5.1 mmol/L 3.5  4.0  3.6   Chloride 98 - 111 mmol/L 104  103  102   CO2 22 - 32 mmol/L '24  27  28   '$ Calcium 8.9 - 10.3 mg/dL 8.8  8.7  8.5   Total Protein 6.5 - 8.1 g/dL 7.4  7.5  6.9   Total Bilirubin 0.3 - 1.2 mg/dL 0.5  0.7  0.7  Alkaline Phos 38 - 126 U/L 136  130  137   AST 15 - 41 U/L 43  46  39   ALT 0 - 44 U/L '28  27  24     '$ Imaging studies:   CT Head (07/14/2022) pending  CT L-Spine/T-Spine (07/14/2022) reviewed:  IMPRESSION: 1. Mild anterior compression deformity of the L1 vertebral body with approximately 20% loss of vertebral body height anteriorly and no bony retropulsion, new since 05/19/2022 and suspected acute. 2. No other evidence of acute injury in the thoracic or lumbar spine. 3. Overall mild degenerative changes without evidence of high-grade spinal canal or neural foraminal stenosis. 4. Trace left pleural effusion and bibasilar airspace opacities which may reflect atelectasis, though pneumonia is not entirely excluded.    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   - New 11/06: CT Head, T-Spine, L-Spine obtained and reviewed. [name] puch changed at bedside with RN. Wound healing, no bleeding    - Wound Care: [name] pouch with "window" but not on intermittent or continuous suction. Patient seems to like this better. She will need intermittent suction with Yankauer to control effluent. Likely be beneficial to suction before ambulation.    - On Soft diet + nutritional supplementation - Continue TPN; cyclic - Appreciate dietary assistance with trace elements; iron supplementation            - Monitor abdominal examination; on-going bowel function            - Pain control prn; antemetic prn            - Therapies back on board; recommending HHPT; continue to follow    - Discharge Planning: Anticipate lengthy admission and potential eventual transfer; no new progress    All of the above findings and recommendations were discussed with the patient, and the medical team, and all of patient's questions were answered to her expressed satisfaction.  -- Debra Simon, PA-C [facility] 07/14/2022, 7:15 AM M-F: 7am - 4pm

## 2022-07-15 ENCOUNTER — Inpatient Hospital Stay: Payer: Medicaid Other

## 2022-07-15 DIAGNOSIS — S32010A Wedge compression fracture of first lumbar vertebra, initial encounter for closed fracture: Secondary | ICD-10-CM | POA: Diagnosis not present

## 2022-07-15 LAB — GLUCOSE, CAPILLARY
Glucose-Capillary: 241 mg/dL — ABNORMAL HIGH (ref 70–99)
Glucose-Capillary: 160 mg/dL — ABNORMAL HIGH (ref 70–99)
Glucose-Capillary: 107 mg/dL — ABNORMAL HIGH (ref 70–99)
Glucose-Capillary: 139 mg/dL — ABNORMAL HIGH (ref 70–99)

## 2022-07-15 LAB — COMPREHENSIVE METABOLIC PANEL
ALT: 34 U/L (ref 0–44)
AST: 35 U/L (ref 15–41)
Albumin: 2.8 g/dL — ABNORMAL LOW (ref 3.5–5.0)
Alkaline Phosphatase: 146 U/L — ABNORMAL HIGH (ref 38–126)
Anion gap: 8 (ref 5–15)
BUN: 60 mg/dL — ABNORMAL HIGH (ref 6–20)
CO2: 23 mmol/L (ref 22–32)
Calcium: 8.9 mg/dL (ref 8.9–10.3)
Chloride: 110 mmol/L (ref 98–111)
Creatinine, Ser: 1.33 mg/dL — ABNORMAL HIGH (ref 0.44–1.00)
GFR, Estimated: 46 mL/min — ABNORMAL LOW (ref >60)
Glucose, Bld: 229 mg/dL — ABNORMAL HIGH (ref 70–99)
Potassium: 3.2 mmol/L — ABNORMAL LOW (ref 3.5–5.1)
Sodium: 141 mmol/L (ref 135–145)
Total Bilirubin: 1.1 mg/dL (ref 0.3–1.2)
Total Protein: 6.9 g/dL (ref 6.5–8.1)

## 2022-07-15 LAB — MAGNESIUM: Magnesium: 2.2 mg/dL (ref 1.7–2.4)

## 2022-07-15 LAB — PHOSPHORUS: Phosphorus: 4.5 mg/dL (ref 2.5–4.6)

## 2022-07-15 MED ORDER — TRACE MINERALS CU-MN-SE-ZN 300-55-60-3000 MCG/ML IV SOLN
INTRAVENOUS | Status: AC
  Filled 2022-07-15: qty 681.33

## 2022-07-15 MED ORDER — POTASSIUM CHLORIDE CRYS ER 20 MEQ PO TBCR
40.0000 meq | EXTENDED_RELEASE_TABLET | Freq: Once | ORAL | Status: AC
  Administered 2022-07-15: 40 meq via ORAL
  Filled 2022-07-15: qty 2

## 2022-07-15 NOTE — Progress Notes (Signed)
Nutrition Follow-up  DOCUMENTATION CODES:   Obesity unspecified  INTERVENTION:   Continue cyclic TPN per pharmacy (16 hrs)- provides 1817kcal/day and 102g/day protein   Ensure Enlive po TID, each supplement provides 350 kcal and 20 grams of protein.  MVI, chromium and trace elements in TPN   Daily weights   Vitamin D2-  50,000 units po weekly   Manganese, zinc and copper labs pending.   NUTRITION DIAGNOSIS:   Increased nutrient needs related to wound healing, catabolic illness as evidenced by estimated needs.  GOAL:   Patient will meet greater than or equal to 90% of their needs -met with TPN   MONITOR:   PO intake, Supplement acceptance, Labs, Weight trends, Diet advancement, I & O's, TPN  ASSESSMENT:   56 y/o female with h/o hypothyroidism, COVID 19 (3/21), kidney stones and stage 3 colon cancer (s/p left hemicolectomy 5/21 and chemotherapy) who is admitted for new pelvic mass now s/p laparotomy 6/8 (with excision of pelvic mass from greater omentum, abdominal wall reconstruction with bilateral myocutaneous flaps and mesh, incisional hernia repair, appendectomy repair and VAC placement) complicated by bowel perforation s/p reopening of recent laparotomy 6/10 (with repair of small bowel perforation, excision of mesh, placement of two phasix mesh and VAC placement). Pathology returned as metastatic adenocarcinoma.   Pt continues to tolerate TPN well at goal rate; TPN is currently being cycled for 16 hours. Triglycerides wnl and are being checked monthly. Hyperglycemia improved with insulin in TPN. Volume status improved with diuresis; pt remains up ~12lbs from admission. Trace element labs pending. Will recheck vitamin labs 11/30 and then every other month once normal. Will check manganese levels monthly. Pt continues to have poor oral intake. Pt is drinking some Ensure. [name] pouch remains in place with intermittent suction and was last changed 11/6. Pt with new back pain and  was found to have L1 compression fracture.    Medications reviewed and include: D2, lasix, heparin, insulin, L-glutamine, synthroid, imodium, protonix, carafate  -Selenium 105 wnl- 11/2 -Chromium 1.4 wnl, Manganese 26.3(H), Iodine 57.9 wnl, copper 97 wnl, zinc 90 wnl- 10/2 -Iron 31.0 wnl, TIBC 360, ferritin 33 wnl, transferrin 272, folate 10.5 wnl, B12 291- 10/16 -Vitamin B1- 117.2, vitamin D 19.46(L), vitamin A 14.3(L), B6 11.9 wnl, vitamin C 0.8, vitamin E 13.4 wnl, vitamin K 0.64 wnl- 10/16  Labs reviewed: Na 141 wnl, K 3.2(L), BUN 60(H), P 4.5 wnl, Mg 2.2 wnl- 10/30 Triglycerides- 98-10/16 Hgb 9.9(L), Hct 31.1(L)- 11/5 Cbgs- 160, 241 x 24 hrs  Diet Order:    Diet Order             DIET SOFT Room service appropriate? Yes; Fluid consistency: Thin  Diet effective now                  EDUCATION NEEDS:   Not appropriate for education at this time  Skin:  Skin Assessment: Reviewed RN Assessment ( Midline wound: 11cm x 12cm )  Last BM:  11/6- type 6  Height:   Ht Readings from Last 1 Encounters:  03/01/22 _0  (1.499 m)    Weight:   Wt Readings from Last 1 Encounters:  07/14/22 95 kg    Ideal Body Weight:  44.3 kg  BMI:  Body mass index is 42.3 kg/m.  Estimated Nutritional Needs:   Kcal:  1800-2100kcal/day  Protein:  90-110g/d  Fluid:  1.4-1.6L/day  Koleen Distance MS, RD, LDN Please refer to [facility] for RD and/or RD on-call/weekend/after hours pager

## 2022-07-15 NOTE — Progress Notes (Signed)
PHARMACY - TOTAL PARENTERAL NUTRITION CONSULT NOTE   Indication: Prolonged ileus  Patient Measurements: Height: '4\' 11"'$  (149.9 cm) Weight: 95 kg (209 lb 7 oz) IBW/kg (Calculated) : 43.2 TPN AdjBW (KG): 55.2 Body mass index is 42.3 kg/m.  Assessment: Elita Zynasia Burklow is a 56 y.o. female s/p laparotomy, excision of greater omental mass, abdominal wall reconstruction with [name], appendectomy, and placement of Prevena vac.  Glucose / Insulin: No apparent history of diabetes.  BG have been stable and at goal with SSI 4x/day + 10u insulin in TPN. 10 units SSI/24 hrs. This has increased-monitor  Electrolytes: Within normal limits Renal: Scr < 1 Hepatic: No transaminitis. LFTs within normal limits. TG within normal limits GI Imaging: 9/11 CTAP: no new acute issues GI Surgeries / Procedures: s/p laparotomy, excision of greater omental mass, abdominal wall reconstruction with [name], appendectomy, and placement of Prevena vac  Central access: 02/15/22 TPN start date: 02/15/22  Nutritional Goals: Goal cyclic TPN over 16 hrs: cyclic TPN over 16 hrs: (provides 102 g of protein and 1817 kcals per day) total volume over 16hrs for calculations=1400 ml(1500 ml total with overfill)  RD Assessment:  Estimated Needs Total Energy Estimated Needs: 1800-2100kcal/day Total Protein Estimated Needs: 90-110g/d Total Fluid Estimated Needs: 1.4-1.6L/day  Current Nutrition:  Soft diet + nutritional supplements, not meeting PO needs yet  Patient walks around in afternoon/evening On loperamide q6h scheduled started 7/18  Plan: Transitioned to *Cyclic* TPN on 5/42.   to run over 16 hours per MD request. [name] at 2000 per discussion with Dietician to allow patient time for walking in afternoon/evenings.  To run over 16 hours: -Start rate at 49 mL/hr for 1 hour. -Increase rate to 98 mL/hr for 13 hours.  -Decrease rate to 49 mL/hr for 1 hour. -Decrease rate to 25 mL/hr for  1 hours, then stop.  Nutritional Components Amino acids (using 15% Clinisol): 102 g Dextrose 19% = 266 g Lipids (using 20% SMOFlipids): 50.4 g kCal: 1817/24h  Electrolytes in TPN (standard): Na 41mq/L, K 567m/L, Ca 38m738mL, Mg 10 mEq/L, Phos 10 mmol/L. Cl:Ac 1:1 Add MVI, trace elements, and chromium to TPN  Continue CBG/SSI for Cyclic TPN: SSI  -CBG 2 hrs after cyclic TPN start -CBG during middle of cyclic TPN infusion -CBG 1 hr after cylic TPN stopped -CBG while off TPN (=4 CBGs per 24 hr period) SSI moderate + 10u insulin in TPN Check TPN labs on Mon/Thurs at minimum 40 mEq oral KCl x 1 Monitor for further need of diuresis and adjust as needed for fluid status Continues Lasix 20 mg PO BID added 10/13  [name]/03/2022 7:04 AM

## 2022-07-15 NOTE — Consult Note (Signed)
Referring Physician:  No referring provider defined for this encounter.  Primary Physician:  [name], MD  History of Present Illness: 07/15/2022 Ms. Jackline Kelseigh Diver is here today with a chief complaint of back pain.  She has been in the hospital for approximately 5 months.  She is here for management of complex general surgical issue.  She was bending over yesterday and began having significant back pain after she felt a pop.  She was found to have an L1 compression fracture.  She has no neurologic symptoms other than pain.   Review of Systems:  deferred  Past Medical History: Past Medical History:  Diagnosis Date   Anemia    Colon cancer ([place]) 04/2020   History of kidney stones    Hypothyroidism    Thyroid disease     Past Surgical History: Past Surgical History:  Procedure Laterality Date   ABDOMINAL WALL DEFECT REPAIR N/A 02/13/2022   Procedure: REPAIR ABDOMINAL WALL;  Surgeon: Jules Husbands, MD;  Location: [facility];  Service: General;  Laterality: N/A;   APPENDECTOMY N/A 02/13/2022   Procedure: APPENDECTOMY;  Surgeon: Jules Husbands, MD;  Location: [facility];  Service: General;  Laterality: N/A;   APPLICATION OF WOUND VAC N/A 02/15/2022   Procedure: APPLICATION OF WOUND VAC;  Surgeon: Jules Husbands, MD;  Location: [facility];  Service: General;  Laterality: N/A;  [id number]   COLON RESECTION SIGMOID N/A 02/13/2022   Procedure: COLON RESECTION SIGMOID, open, [name], PA-C to assist;  Surgeon: Jules Husbands, MD;  Location: [facility];  Service: General;  Laterality: N/A;   COLONOSCOPY WITH PROPOFOL N/A 11/21/2019   Procedure: COLONOSCOPY WITH PROPOFOL;  Surgeon: Lin Landsman, MD;  Location: [facility] ENDOSCOPY;  Service: Gastroenterology;  Laterality: N/A;   COLONOSCOPY WITH PROPOFOL N/A 07/10/2021   Procedure: COLONOSCOPY WITH PROPOFOL;  Surgeon: Lin Landsman, MD;  Location: [facility] ENDOSCOPY;  Service: Gastroenterology;  Laterality: N/A;   COLONOSCOPY WITH  PROPOFOL N/A 02/06/2022   Procedure: COLONOSCOPY WITH PROPOFOL;  Surgeon: Lin Landsman, MD;  Location: [facility] ENDOSCOPY;  Service: Gastroenterology;  Laterality: N/A;  SPANISH   CYSTOSCOPY WITH STENT PLACEMENT Left 01/30/2020   Procedure: CYSTOSCOPY WITH STENT PLACEMENT;  Surgeon: Hollice Espy, MD;  Location: [facility];  Service: Urology;  Laterality: Left;   ESOPHAGOGASTRODUODENOSCOPY (EGD) WITH PROPOFOL N/A 11/21/2019   Procedure: ESOPHAGOGASTRODUODENOSCOPY (EGD) WITH PROPOFOL;  Surgeon: Lin Landsman, MD;  Location: [facility] ENDOSCOPY;  Service: Gastroenterology;  Laterality: N/A;   INSERTION OF MESH N/A 02/13/2022   Procedure: INSERTION OF MESH;  Surgeon: Jules Husbands, MD;  Location: [facility];  Service: General;  Laterality: N/A;   INSERTION OF MESH N/A 02/15/2022   Procedure: INSERTION OF MESH;  Surgeon: Jules Husbands, MD;  Location: [facility];  Service: General;  Laterality: N/A;   IR RADIOLOGIST EVAL & MGMT  07/03/2020   IR RADIOLOGIST EVAL & MGMT  07/12/2020   LAPAROTOMY N/A 02/15/2022   Procedure: EXPLORATORY LAPAROTOMY REPAIR OF BOWEL PERFORATION;  Surgeon: Jules Husbands, MD;  Location: [facility];  Service: General;  Laterality: N/A;   PARTIAL COLECTOMY N/A 01/30/2020   Procedure: PARTIAL COLECTOMY Sigmoid;  Surgeon: [name], MD;  Location: [place] [facility];  Service: General;  Laterality: N/A;   PORTACATH PLACEMENT Left 03/28/2020   Procedure: INSERTION PORT-A-CATH;  Surgeon: [name], MD;  Location: [facility];  Service: General;  Laterality: Left;   PORTACATH PLACEMENT N/A 08/29/2020   Procedure: INSERTION PORT-A-CATH, chemotherapy;  Surgeon:  [name], MD;  Location: [facility];  Service: General;  Laterality: N/A;   VENTRAL HERNIA REPAIR N/A 02/13/2022   Procedure: HERNIA REPAIR VENTRAL ADULT, open, [name], PA-C to assist;  Surgeon: Jules Husbands, MD;  Location: [facility];  Service: General;  Laterality: N/A;    Allergies: Allergies as of 01/22/2022   (No  Known Allergies)    Medications: Current Facility-Administered Medications for the 02/13/22 encounter [facility] Encounter)  Medication   0.9 %  sodium chloride infusion (Manually program via Guardrails IV Fluids)   0.9 %  sodium chloride infusion (Manually program via Guardrails IV Fluids)   Current Meds  Medication Sig   metroNIDAZOLE (FLAGYL) 500 MG tablet Take 2 tabs (1000 mg total) at 8 am, again at 2 pm, and again at 8 pm the day prior to surgery.   neomycin (MYCIFRADIN) 500 MG tablet Take 2 tabs (1000 mg total) at 8 am, again at 2 pm, and again at 8 pm the day prior to surgery.   polyethylene glycol powder (MIRALAX) 17 GM/SCOOP powder Mix with 64 ounces of Gatorade (no red) the day prior to surgery and drink as directed.    Social History: Social History   Tobacco Use   Smoking status: Never   Smokeless tobacco: Never  Vaping Use   Vaping Use: Never used  Substance Use Topics   Alcohol use: Never   Drug use: Never    Family Medical History: Family History  Problem Relation Age of Onset   Diabetes Sister     Physical Examination: Vitals:   07/15/22 0556 07/15/22 0729  BP: 109/68 115/76  Pulse: 85 83  Resp: 18 18  Temp: 98 F (36.7 C) 97.9 F (36.6 C)  SpO2: 96% 96%    General: Patient is well developed, well nourished, calm, collected, and in no apparent distress. Attention to examination is appropriate.  Neck:   Supple.  Full range of motion.  Respiratory: Patient is breathing without any difficulty.   NEUROLOGICAL:     Awake, alert.  Cranial Nerves: Pupils equal round and reactive to light.  Facial tone is symmetric.  Facial sensation is symmetric. Shoulder shrug is symmetric. Tongue protrusion is midline.  There is no pronator drift.  ROM of spine: full.    Strength: MAEW with apparent full strength  Medical Decision Making  Imaging: MRI L spine 07/14/22 IMPRESSION: 1. Acute or subacute compression fracture of the superior aspect of L1, with  approximately 20% vertebral body height loss, as seen on the same-day CT lumbar spine. No significant retropulsion or spinal canal stenosis. 2. No evidence of metastatic disease in the lumbar spine. 3. Prominence of the right-greater-than-left renal pelvis, with somewhat dilated ureters bilaterally, concerning for hydronephrosis, possibly obstructive. Recommend correlation with renal function and consider renal ultrasound or CT abdomen pelvis if clinically indicated.   Impression #3 will be called to the ordering clinician or representative by the Radiologist Assistant, and communication documented in the PACS or [employer].     Electronically Signed   By: Merilyn Baba M.D.   On: 07/14/2022 19:43    I have personally reviewed the images and agree with the above interpretation.  Assessment and Plan: Ms. Marthena Whitmyer is a pleasant 56 y.o. female with L1 compression fracture.  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  She is not a candidate for placement of a lumbosacral orthotic.  She is okay to mobilize carefully.  She should be careful to minimize bending and  twisting.  She should not lift more than 10 pounds.  We will obtain baseline x-rays.  Given her expected length of stay, we will plan to see her on a regular cadence as we would normally for outpatient follow-up.  I would not recommend surgical intervention.     Alishba Naples K. Izora Ribas MD, [facility] Neurosurgery

## 2022-07-15 NOTE — TOC Progression Note (Signed)
Transition of Care [facility]) - Progression Note    Patient Details  Name: Debra [name] MRN: [id number] Date of Birth: 05/16/1966  Transition of Care [facility]) CM/SW Contact  Beverly Sessions, RN Phone Number: 07/15/2022, 2:25 PM  Clinical Narrative:      [facility] supervisor update on case   Expected Discharge Plan: [place] Barriers to Discharge: Continued Medical Work up  Expected Discharge Plan and Services Expected Discharge Plan: [place]                                               Social Determinants of Health (SDOH) Interventions    Readmission Risk Interventions     No data to display

## 2022-07-15 NOTE — Progress Notes (Signed)
[facility] SURGICAL PROGRESS NOTE (cpt [phone])  Hospital Day(s): 152.   Post op day(s): 150 Days Post-Op.   Interval History:  Patient seen and examined [name] intact; Managing effluent with intermittent suction [name] last changed on 11/06 Labs this morning reassuring; mild hypokalemia to 3.2 She is on soft diet; on cyclic TPN More difficulty ambulating secondary to back pain; PT re-consulted; recommending HHPT  Still with back pain; intermittent improvement and flare ups; no lower extremity weakness, numbness, or urinary incontinence. She is using pure wick as pain makes it difficult to get up to urinate. She did have CT + MRI yesterday concerning for compression fracture L1, no evidence of metastasis.   Review of Systems:  Constitutional: denies fever, chills  HEENT: denies cough or congestion  Respiratory: denies any shortness of breath  Cardiovascular: denies chest pain or palpitations  Gastrointestinal: denies abdominal pain, N/V Genitourinary: denies burning with urination or urinary frequency Integumentary: + midline wound MSK: + Back Pain   Vital signs in last 24 hours: [min-max] current  Temp:  [97.6 F (36.4 C)-98 F (36.7 C)] 97.9 F (36.6 C) (11/07 0729) Pulse Rate:  [78-85] 83 (11/07 0729) Resp:  [14-18] 18 (11/07 0729) BP: (109-115)/(68-76) 115/76 (11/07 0729) SpO2:  [96 %] 96 % (11/07 0729)     Height: '4\' 11"'$  (149.9 cm) Weight: 95 kg BMI (Calculated): 43.04   Intake/Output last 2 shifts:  11/06 0701 - 11/07 0700 In: 650.7 [I.V.:650.7] Out: 1050 [Urine:1050]   Physical Exam:  Constitutional: alert, cooperative and no distress  Respiratory: breathing non-labored at rest  Cardiovascular: regular rate and sinus rhythm  Gastrointestinal: Soft, abdominal soreness on the right, non-distended, no rebound/guarding. Integumentary: Midline wound open, peritoneum closed; granulating; there are three stomatized areas visible in the LUQ and LLQ portions  of the wound, output remains feculent. There is hypergranulation tissue to the inferior wound edge.   Labs:     Latest Ref Rng & Units 07/13/2022    7:05 AM 07/02/2022   10:13 AM 06/17/2022    5:03 AM  CBC  WBC 4.0 - 10.5 K/uL 4.5  3.7  2.7   Hemoglobin 12.0 - 15.0 g/dL 9.9  10.2  8.1   Hematocrit 36.0 - 46.0 % 31.1  32.6  25.6   Platelets 150 - 400 K/uL 115  84  69       Latest Ref Rng & Units 07/15/2022    6:24 AM 07/14/2022    3:27 PM 07/10/2022   11:53 AM  CMP  Glucose 70 - 99 mg/dL 229  128  177   BUN 6 - 20 mg/dL 60  54  37   Creatinine 0.44 - 1.00 mg/dL 1.33  1.30  0.81   Sodium 135 - 145 mmol/L 141  143  135   Potassium 3.5 - 5.1 mmol/L 3.2  3.0  3.5   Chloride 98 - 111 mmol/L 110  109  104   CO2 22 - 32 mmol/L '23  27  24   '$ Calcium 8.9 - 10.3 mg/dL 8.9  9.1  8.8   Total Protein 6.5 - 8.1 g/dL 6.9  7.3  7.4   Total Bilirubin 0.3 - 1.2 mg/dL 1.1  1.2  0.5   Alkaline Phos 38 - 126 U/L 146  161  136   AST 15 - 41 U/L 35  42  43   ALT 0 - 44 U/L 34  38  28     Imaging studies:   MRI L-Spine (  07/14/2022) personally reviewed and agree with radiologist report reviewed below:  IMPRESSION: 1. Acute or subacute compression fracture of the superior aspect of L1, with approximately 20% vertebral body height loss, as seen on the same-day CT lumbar spine. No significant retropulsion or spinal canal stenosis. 2. No evidence of metastatic disease in the lumbar spine. 3. Prominence of the right-greater-than-left renal pelvis, with somewhat dilated ureters bilaterally, concerning for hydronephrosis, possibly obstructive. Recommend correlation with renal function and consider renal ultrasound or CT abdomen pelvis if clinically indicated.  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   - New 11/07: Will discuss with neurosurgery this morning given MRI findings; L1 compression fracture....recommendations are LSO but this is likely not feasible given midline wound/[name]. No surgical interventions. Okay to work with therapies but recommend no lifting, bending, twisting nor lifting more than 10 lbs. Replete K+; monitor. Otherwise, nothing new    - Wound Care: Last change 11/06; pictures added. [name] pouch with "window" but not on intermittent or continuous suction. Patient seems to like this better. She will need intermittent suction with Yankauer to control effluent. Likely be beneficial to suction before ambulation.    - On Soft diet + nutritional supplementation - Continue TPN; cyclic - Appreciate dietary assistance with trace elements; iron supplementation            - Monitor abdominal examination; on-going bowel function            - Pain control prn; antemetic prn            - Therapies back on board; recommending HHPT; continue to follow    - Discharge Planning: Anticipate lengthy admission and potential eventual transfer; no new progress    All of the above findings and recommendations were discussed with the patient, and the medical team, and all of patient's questions were answered to her expressed satisfaction.  -- Debra Simon, PA-C [facility] 07/15/2022, 8:01 AM M-F: 7am - 4pm

## 2022-07-15 NOTE — Progress Notes (Signed)
Physical Therapy Treatment Patient Details Name: Debra [name] MRN: [id number] DOB: 01-07-1966 Today's Date: 07/15/2022   History of Present Illness Pt is a 56 y.o. female with PMH including hypothyroidism, iron deficient anemia, colon cancer.  Here s/p mass removal with hernia repair and appendectomy 6/8, bowel perforation repair 6/10. [name] pouch was initially placed 6/20 and changed on 7/10. Re-evaluation consult placed as pt with acute back pain and muscle spasms, not able to get OOB in 3 days. Pt is POD 148.    PT Comments    Pt received upright in bed agreeable to PT. [place] interpreter [place], [id number]). Pt initially needing modA with bed mobility, unable to understand log roll despite education and max multimodal cuing. Once sitting pt demonstrates adequate sitting balance. First STS pt required min to modA, able to perform R/L lateral side steps at [facility] for total of 8'. Overall maintained standing for total of ~8-10 minutes. PT seated indicating wet chuck clothes. X2 STS to RW with minguard replacing with clean chuck pads. Pt returned to supine in bed minA at LE's. Pt slow moving due to pain but progressing in tolerance for mobility. D/c recs remain appropriate.   Recommendations for follow up therapy are one component of a multi-disciplinary discharge planning process, led by the attending physician.  Recommendations may be updated based on patient status, additional functional criteria and insurance authorization.  Follow Up Recommendations  Home health PT Can patient physically be transported by private vehicle: No   Assistance Recommended at Discharge Set up Supervision/Assistance  Patient can return home with the following A lot of help with walking and/or transfers;A lot of help with bathing/dressing/bathroom   Equipment Recommendations  Rolling walker (2 wheels);BSC/3in1    Recommendations for Other Services       Precautions / Restrictions  Precautions Precautions: Fall Restrictions Weight Bearing Restrictions: No Other Position/Activity Restrictions: Abdominal surgery/[name] pouch     Mobility  Bed Mobility Overal bed mobility: Needs Assistance Bed Mobility: Supine to Sit, Sit to Supine     Supine to sit: Mod assist, HOB elevated Sit to supine: Min assist   General bed mobility comments: minA for LE's. Doesn't follow directions with log roll despite education and using interpreter. Patient Response: Flat affect, Cooperative  Transfers Overall transfer level: Needs assistance Equipment used: Rolling walker (2 wheels) Transfers: Sit to/from Stand Sit to Stand: Min assist, Min guard           General transfer comment: Initially minA but with multiple reps able to perform minguard.    Ambulation/Gait Ambulation/Gait assistance: Min guard Gait Distance (Feet): 8 Feet Assistive device: Rolling walker (2 wheels) Gait Pattern/deviations: Step-to pattern       General Gait Details: R/L lateral side steps at EOB totaling 8'. Min VC's for sequencing RW and LE's   Stairs             Wheelchair Mobility    Modified Rankin (Stroke Patients Only)       Balance Overall balance assessment: Needs assistance Sitting-balance support: Feet supported Sitting balance-Leahy Scale: Fair Sitting balance - Comments: Able to maintain static sitting with supervision   Standing balance support: Bilateral upper extremity supported, During functional activity Standing balance-Leahy Scale: Fair Standing balance comment: light UE support on RW                            Cognition Arousal/Alertness: Awake/alert Behavior During Therapy: [facility] for tasks assessed/performed Overall Cognitive  Status: No family/caregiver present to determine baseline cognitive functioning                                 General Comments: did not appear anxious this date        Exercises Other  Exercises Other Exercises: Education on log roll technique, BLT precautions    General Comments        Pertinent Vitals/Pain Pain Assessment Pain Assessment: Faces Faces Pain Scale: Hurts whole lot Pain Location: back Pain Descriptors / Indicators: Grimacing, Discomfort Pain Intervention(s): RN gave pain meds during session, Limited activity within patient's tolerance, Monitored during session, Repositioned    Home Living                          Prior Function            PT Goals (current goals can now be found in the care plan section) Acute Rehab PT Goals Patient Stated Goal: to go home PT Goal Formulation: With patient Time For Goal Achievement: 07/27/22 Potential to Achieve Goals: Good Progress towards PT goals: Progressing toward goals    Frequency    Min 2X/week      PT Plan Current plan remains appropriate    Co-evaluation              AM-PAC PT "6 Clicks" Mobility   Outcome Measure  Help needed turning from your back to your side while in a flat bed without using bedrails?: A Little Help needed moving from lying on your back to sitting on the side of a flat bed without using bedrails?: A Lot Help needed moving to and from a bed to a chair (including a wheelchair)?: A Lot Help needed standing up from a chair using your arms (e.g., wheelchair or bedside chair)?: A Little Help needed to walk in hospital room?: A Little Help needed climbing 3-5 steps with a railing? : A Lot 6 Click Score: 15    End of Session Equipment Utilized During Treatment: Gait belt Activity Tolerance: Patient limited by pain;Patient tolerated treatment well Patient left: in bed Nurse Communication: Mobility status PT Visit Diagnosis: Muscle weakness (generalized) (M62.81);Pain     Time: 1415-1444 PT Time Calculation (min) (ACUTE ONLY): 29 min  Charges:  $Therapeutic Activity: 23-37 mins                    Quintez Maselli M. Fairly IV, PT, DPT Physical Therapist-  [facility]  07/15/2022, 3:40 PM

## 2022-07-16 LAB — GLUCOSE, CAPILLARY
Glucose-Capillary: 203 mg/dL — ABNORMAL HIGH (ref 70–99)
Glucose-Capillary: 245 mg/dL — ABNORMAL HIGH (ref 70–99)
Glucose-Capillary: 196 mg/dL — ABNORMAL HIGH (ref 70–99)
Glucose-Capillary: 133 mg/dL — ABNORMAL HIGH (ref 70–99)
Glucose-Capillary: 183 mg/dL — ABNORMAL HIGH (ref 70–99)

## 2022-07-16 LAB — BASIC METABOLIC PANEL
Anion gap: 7 (ref 5–15)
BUN: 62 mg/dL — ABNORMAL HIGH (ref 6–20)
CO2: 25 mmol/L (ref 22–32)
Calcium: 8.8 mg/dL — ABNORMAL LOW (ref 8.9–10.3)
Chloride: 109 mmol/L (ref 98–111)
Creatinine, Ser: 1.26 mg/dL — ABNORMAL HIGH (ref 0.44–1.00)
GFR, Estimated: 49 mL/min — ABNORMAL LOW (ref >60)
Glucose, Bld: 221 mg/dL — ABNORMAL HIGH (ref 70–99)
Potassium: 3.5 mmol/L (ref 3.5–5.1)
Sodium: 141 mmol/L (ref 135–145)

## 2022-07-16 MED ORDER — TRACE MINERALS CU-MN-SE-ZN 300-55-60-3000 MCG/ML IV SOLN: INTRAVENOUS | Status: DC

## 2022-07-16 MED ORDER — TRACE MINERALS CU-MN-SE-ZN 300-55-60-3000 MCG/ML IV SOLN
INTRAVENOUS | Status: AC
  Filled 2022-07-16: qty 681.33

## 2022-07-16 NOTE — Progress Notes (Signed)
Physical Therapy Treatment Patient Details Name: Debra [name] MRN: [id number] DOB: 02-28-1966 Today's Date: 07/16/2022   History of Present Illness Pt is a 56 y.o. female with PMH including hypothyroidism, iron deficient anemia, colon cancer.  Here s/p mass removal with hernia repair and appendectomy 6/8, bowel perforation repair 6/10. [name] pouch was initially placed 6/20 and changed on 7/10. Re-evaluation consult placed as pt with acute back pain and muscle spasms, not able to get OOB in 3 days. Pt is POD 148.    PT Comments    Pt received upright in bed agreeable to PT. Utilization of [facility] interpreter, Kimberly ID# [id number]. Pt still requiring heavy assist with bed mobility this date with bed features and multimodal cuing to perform log roll with limited carryover. Pt standing with supervision this date to RW tolerating a slow, and effortful 16' of gait. Pt demonstrates good stability and use of RW, adequate LE strength, but limited due to her LBP throughout. Educated during standing and gait efforts of importance of BLT precautions for pain modulation and tissue healing timelines to prevent further complications. Encouraged OOB mobility as well with nursing staff to <> from recliner, using BSC. Pt returning to supine in bed with all needs in reach. RN updated on assisting pt in mobility to improve functional mobility, pain, and strength. D/c recs remain appropriate.     Recommendations for follow up therapy are one component of a multi-disciplinary discharge planning process, led by the attending physician.  Recommendations may be updated based on patient status, additional functional criteria and insurance authorization.  Follow Up Recommendations  Home health PT Can patient physically be transported by private vehicle: No   Assistance Recommended at Discharge Set up Supervision/Assistance  Patient can return home with the following A little help with walking and/or transfers;A lot  of help with bathing/dressing/bathroom   Equipment Recommendations       Recommendations for Other Services       Precautions / Restrictions Precautions Precautions: Fall Restrictions Weight Bearing Restrictions: No Other Position/Activity Restrictions: Abdominal surgery/[name] pouch     Mobility  Bed Mobility Overal bed mobility: Needs Assistance Bed Mobility: Supine to Sit, Sit to Supine     Supine to sit: Mod assist, HOB elevated Sit to supine: Min assist   General bed mobility comments: minA for LE's. Doesn't follow directions with log roll despite education and using interpreter. Patient Response: Cooperative, Flat affect  Transfers Overall transfer level: Needs assistance Equipment used: Rolling walker (2 wheels) Transfers: Sit to/from Stand Sit to Stand: Supervision           General transfer comment: increased time but no physical assist.    Ambulation/Gait Ambulation/Gait assistance: Min guard Gait Distance (Feet): 16 Feet Assistive device: Rolling walker (2 wheels) Gait Pattern/deviations: Step-to pattern       General Gait Details: Progressing to 16'. Increased time and effortful due to pain.   Stairs             Wheelchair Mobility    Modified Rankin (Stroke Patients Only)       Balance Overall balance assessment: Needs assistance Sitting-balance support: Feet supported Sitting balance-Leahy Scale: Fair     Standing balance support: Bilateral upper extremity supported, During functional activity Standing balance-Leahy Scale: Fair Standing balance comment: light UE support on RW                            Cognition Arousal/Alertness: Awake/alert Behavior During  Therapy: WFL for tasks assessed/performed Overall Cognitive Status: No family/caregiver present to determine baseline cognitive functioning                                          Exercises      General Comments        Pertinent  Vitals/Pain Pain Assessment Pain Assessment: Faces Faces Pain Scale: Hurts whole lot Pain Location: back Pain Descriptors / Indicators: Grimacing, Discomfort Pain Intervention(s): Limited activity within patient's tolerance, Monitored during session, Repositioned, Premedicated before session    Home Living                          Prior Function            PT Goals (current goals can now be found in the care plan section) Acute Rehab PT Goals Patient Stated Goal: to go home PT Goal Formulation: With patient Time For Goal Achievement: 07/27/22 Potential to Achieve Goals: Good Progress towards PT goals: Progressing toward goals    Frequency    Min 2X/week      PT Plan Current plan remains appropriate    Co-evaluation              AM-PAC PT "6 Clicks" Mobility   Outcome Measure  Help needed turning from your back to your side while in a flat bed without using bedrails?: A Little Help needed moving from lying on your back to sitting on the side of a flat bed without using bedrails?: A Lot Help needed moving to and from a bed to a chair (including a wheelchair)?: A Lot Help needed standing up from a chair using your arms (e.g., wheelchair or bedside chair)?: A Little Help needed to walk in hospital room?: A Little Help needed climbing 3-5 steps with a railing? : A Lot 6 Click Score: 15    End of Session Equipment Utilized During Treatment: Gait belt Activity Tolerance: Patient limited by pain;Patient tolerated treatment well Patient left: in bed Nurse Communication: Mobility status PT Visit Diagnosis: Muscle weakness (generalized) (M62.81);Pain Pain - Right/Left: Right Pain - part of body: Hip (low back)     Time: 1120-1209 PT Time Calculation (min) (ACUTE ONLY): 49 min  Charges:  $Therapeutic Activity: 23-37 mins                    Deforest Maiden M. Fairly IV, PT, DPT Physical Therapist- [facility]  07/16/2022, 1:46  PM

## 2022-07-16 NOTE — Progress Notes (Signed)
PHARMACY - TOTAL PARENTERAL NUTRITION CONSULT NOTE   Indication: Prolonged ileus  Patient Measurements: Height: '4\' 11"'$  (149.9 cm) Weight: 95 kg (209 lb 7 oz) IBW/kg (Calculated) : 43.2 TPN AdjBW (KG): 55.2 Body mass index is 42.3 kg/m.  Assessment: Debra [name] is a 56 y.o. female s/p laparotomy, excision of greater omental mass, abdominal wall reconstruction with [name], appendectomy, and placement of Prevena vac.  Glucose / Insulin: No apparent history of diabetes.  BG 107-245   with SSI 4x/day + 10u insulin in TPN. 10 units SSI/24 hrs.   Electrolytes: Within normal limits Renal: Scr < 1 Hepatic: No transaminitis. LFTs within normal limits. TG within normal limits GI Imaging: 9/11 CTAP: no new acute issues GI Surgeries / Procedures: s/p laparotomy, excision of greater omental mass, abdominal wall reconstruction with [name], appendectomy, and placement of Prevena vac  Central access: 02/15/22 TPN start date: 02/15/22  Nutritional Goals: Goal cyclic TPN over 16 hrs: cyclic TPN over 16 hrs: (provides 102 g of protein and 1817 kcals per day) total volume over 16hrs for calculations=1400 ml(1500 ml total with overfill)  RD Assessment:  Estimated Needs Total Energy Estimated Needs: 1800-2100kcal/day Total Protein Estimated Needs: 90-110g/d Total Fluid Estimated Needs: 1.4-1.6L/day  Current Nutrition:  Soft diet + nutritional supplements, not meeting PO needs yet  Patient walks around in afternoon/evening On loperamide q6h scheduled started 7/18  Plan: Transitioned to *Cyclic* TPN on 3/23.   to run over 16 hours per MD request. [name] at 2000 per discussion with Dietician to allow patient time for walking in afternoon/evenings.  To run over 16 hours: -Start rate at 49 mL/hr for 1 hour. -Increase rate to 98 mL/hr for 13 hours.  -Decrease rate to 49 mL/hr for 1 hour. -Decrease rate to 25 mL/hr for 1 hours, then stop.  Nutritional  Components Amino acids (using 15% Clinisol): 102 g Dextrose 19% = 266 g Lipids (using 20% SMOFlipids): 50.4 g kCal: 1817/24h  Electrolytes in TPN (standard): Na 60mq/L, K 526m/L, Ca 69m52mL, Mg 10 mEq/L, Phos 10 mmol/L. Cl:Ac 1:1 Add MVI, trace elements, and chromium to TPN  Continue CBG/SSI for Cyclic TPN: SSI  -CBG 2 hrs after cyclic TPN start -CBG during middle of cyclic TPN infusion -CBG 1 hr after cylic TPN stopped -CBG while off TPN (=4 CBGs per 24 hr period) SSI moderate + 10u insulin in TPN Check TPN labs on Mon/Thurs at minimum Monitor for further need of diuresis and adjust as needed for fluid status Continues Lasix 20 mg PO BID added 10/13  Advaith Lamarque A 07/16/2022 8:35 AM

## 2022-07-16 NOTE — Progress Notes (Signed)
[facility] SURGICAL PROGRESS NOTE (cpt [id number])  Hospital Day(s): 153.   Post op day(s): 151 Days Post-Op.   Interval History:  Patient seen and examined [name] intact; Managing effluent with intermittent suction [name] last changed on 11/06 No new labs this AM She is on soft diet; on cyclic TPN More difficulty ambulating secondary to back pain; PT following again; recommending HHPT Appreciate neurosurgery recommendations  Review of Systems:  Constitutional: denies fever, chills  HEENT: denies cough or congestion  Respiratory: denies any shortness of breath  Cardiovascular: denies chest pain or palpitations  Gastrointestinal: denies abdominal pain, N/V Genitourinary: denies burning with urination or urinary frequency Integumentary: + midline wound MSK: + Back Pain   Vital signs in last 24 hours: [min-max] current  Temp:  [97.9 F (36.6 C)-98.5 F (36.9 C)] 98.3 F (36.8 C) (11/08 0444) Pulse Rate:  [82-93] 93 (11/08 0444) Resp:  [18-20] 20 (11/08 0444) BP: (115-120)/(69-76) 116/69 (11/08 0444) SpO2:  [94 %-97 %] 95 % (11/08 0444)     Height: '4\' 11"'$  (149.9 cm) Weight: 95 kg BMI (Calculated): 43.04   Intake/Output last 2 shifts:  11/07 0701 - 11/08 0700 In: 660 [I.V.:660] Out: 2000 [Urine:600; Drains:1400]   Physical Exam:  Constitutional: alert, cooperative and no distress  Respiratory: breathing non-labored at rest  Cardiovascular: regular rate and sinus rhythm  Gastrointestinal: Soft, abdominal soreness on the right, non-distended, no rebound/guarding. Integumentary: Midline wound open, peritoneum closed; granulating; there are three stomatized areas visible in the LUQ and LLQ portions of the wound, output remains feculent. There is hypergranulation tissue to the inferior wound edge.   Labs:     Latest Ref Rng & Units 07/13/2022    7:05 AM 07/02/2022   10:13 AM 06/17/2022    5:03 AM  CBC  WBC 4.0 - 10.5 K/uL 4.5  3.7  2.7   Hemoglobin 12.0 - 15.0  g/dL 9.9  10.2  8.1   Hematocrit 36.0 - 46.0 % 31.1  32.6  25.6   Platelets 150 - 400 K/uL 115  84  69       Latest Ref Rng & Units 07/15/2022    6:24 AM 07/14/2022    3:27 PM 07/10/2022   11:53 AM  CMP  Glucose 70 - 99 mg/dL 229  128  177   BUN 6 - 20 mg/dL 60  54  37   Creatinine 0.44 - 1.00 mg/dL 1.33  1.30  0.81   Sodium 135 - 145 mmol/L 141  143  135   Potassium 3.5 - 5.1 mmol/L 3.2  3.0  3.5   Chloride 98 - 111 mmol/L 110  109  104   CO2 22 - 32 mmol/L '23  27  24   '$ Calcium 8.9 - 10.3 mg/dL 8.9  9.1  8.8   Total Protein 6.5 - 8.1 g/dL 6.9  7.3  7.4   Total Bilirubin 0.3 - 1.2 mg/dL 1.1  1.2  0.5   Alkaline Phos 38 - 126 U/L 146  161  136   AST 15 - 41 U/L 35  42  43   ALT 0 - 44 U/L 34  38  28     Imaging studies: No new pertinent imaging studies  Assessment/Plan:  56 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   - New 11/08: Appreciate neurosurgery input/recommendations. Nothing new otherwise     - Wound Care: Last change 11/06; pictures added. [name] pouch with "window" but not on intermittent or continuous suction. Patient seems to like this better. She will need intermittent suction with Yankauer to control effluent. Likely be beneficial to suction before ambulation.    - On Soft diet + nutritional supplementation - Continue TPN; cyclic - Appreciate dietary assistance with trace elements; iron supplementation            - Monitor abdominal examination; on-going bowel function            - Pain control prn; antemetic prn            - Therapies back on board; recommending HHPT; continue to follow    - Discharge Planning: Anticipate lengthy admission and potential eventual transfer; no new progress regarding  disposition    All of the above findings and recommendations were discussed with the patient, and the medical team, and all of patient's questions were answered to her expressed satisfaction.  -- [name], PA-C [facility] 07/16/2022, 7:17 AM M-F: 7am - 4pm

## 2022-07-17 LAB — GLUCOSE, CAPILLARY
Glucose-Capillary: 219 mg/dL — ABNORMAL HIGH (ref 70–99)
Glucose-Capillary: 158 mg/dL — ABNORMAL HIGH (ref 70–99)
Glucose-Capillary: 204 mg/dL — ABNORMAL HIGH (ref 70–99)
Glucose-Capillary: 165 mg/dL — ABNORMAL HIGH (ref 70–99)

## 2022-07-17 LAB — MISC LABCORP TEST (SEND OUT)
LabCorp test name: 724195
Labcorp test code: 724195

## 2022-07-17 LAB — PHOSPHORUS: Phosphorus: 3.9 mg/dL (ref 2.5–4.6)

## 2022-07-17 LAB — MAGNESIUM: Magnesium: 2.2 mg/dL (ref 1.7–2.4)

## 2022-07-17 MED ORDER — TRACE MINERALS CU-MN-SE-ZN 300-55-60-3000 MCG/ML IV SOLN
INTRAVENOUS | Status: AC
  Filled 2022-07-17: qty 681.33

## 2022-07-17 NOTE — Progress Notes (Signed)
Physical Therapy Treatment Patient Details Name: Debra [name] MRN: [id number] DOB: 1966-02-15 Today's Date: 07/17/2022   History of Present Illness Pt is a 56 y.o. female with PMH including hypothyroidism, iron deficient anemia, colon cancer.  Here s/p mass removal with hernia repair and appendectomy 6/8, bowel perforation repair 6/10. [name] pouch was initially placed 6/20 and changed on 7/10. Re-evaluation consult placed as pt with acute back pain and muscle spasms, not able to get OOB in 3 days. Pt is POD 148.    PT Comments    Pt received upright in recliner agreeable to PT. Pt standing with supervision and ambulating total of 100' with RW and supervision. Notable improvement in gait speed and tolerance for OOB activity. Pt endorses need for continent void of bladder. Supervision for stand to sit transfer to toilet but otherwise indep with toileting and perihygiene and indep with STS from toilet to RW. Pt returns to supine in bed with minA at RLE. Pt making excellent progress to PLOF. D/c recs remain appropriate.    Recommendations for follow up therapy are one component of a multi-disciplinary discharge planning process, led by the attending physician.  Recommendations may be updated based on patient status, additional functional criteria and insurance authorization.  Follow Up Recommendations  Home health PT Can patient physically be transported by private vehicle: Yes   Assistance Recommended at Discharge Set up Supervision/Assistance  Patient can return home with the following A little help with walking and/or transfers;A lot of help with bathing/dressing/bathroom   Equipment Recommendations  Rolling walker (2 wheels);BSC/3in1    Recommendations for Other Services       Precautions / Restrictions Precautions Precautions: Fall Precaution Comments: PICC line R UE, [name] pouch abdomen Restrictions Weight Bearing Restrictions: No Other Position/Activity Restrictions:  Abdominal surgery/[name] pouch     Mobility  Bed Mobility Overal bed mobility: Needs Assistance Bed Mobility: Supine to Sit     Supine to sit: Min assist     General bed mobility comments: minA only with RLE. Patient Response: Cooperative  Transfers Overall transfer level: Needs assistance Equipment used: Rolling walker (2 wheels) Transfers: Sit to/from Stand Sit to Stand: Supervision           General transfer comment: increased time but no physical assist.    Ambulation/Gait Ambulation/Gait assistance: Supervision Gait Distance (Feet): 100 Feet Assistive device: Rolling walker (2 wheels) Gait Pattern/deviations: Step-through pattern, Narrow base of support       General Gait Details: improved gait speed this date.   Stairs             Wheelchair Mobility    Modified Rankin (Stroke Patients Only)       Balance Overall balance assessment: Needs assistance Sitting-balance support: Feet supported Sitting balance-Leahy Scale: Fair Sitting balance - Comments: Able to maintain static sitting with supervision   Standing balance support: Bilateral upper extremity supported, During functional activity Standing balance-Leahy Scale: Fair Standing balance comment: light UE support on RW                            Cognition Arousal/Alertness: Awake/alert   Overall Cognitive Status: No family/caregiver present to determine baseline cognitive functioning                                          Exercises  General Comments        Pertinent Vitals/Pain Pain Assessment Pain Assessment: Faces Faces Pain Scale: Hurts little more Pain Location: back Pain Descriptors / Indicators: Grimacing, Discomfort Pain Intervention(s): Limited activity within patient's tolerance, Monitored during session, Repositioned    Home Living                          Prior Function            PT Goals (current goals can now  be found in the care plan section) Acute Rehab PT Goals Patient Stated Goal: to go home PT Goal Formulation: With patient Time For Goal Achievement: 07/27/22 Potential to Achieve Goals: Good Progress towards PT goals: Progressing toward goals    Frequency    Min 2X/week      PT Plan Current plan remains appropriate    Co-evaluation              AM-PAC PT "6 Clicks" Mobility   Outcome Measure  Help needed turning from your back to your side while in a flat bed without using bedrails?: A Little Help needed moving from lying on your back to sitting on the side of a flat bed without using bedrails?: A Little Help needed moving to and from a bed to a chair (including a wheelchair)?: A Little Help needed standing up from a chair using your arms (e.g., wheelchair or bedside chair)?: A Little Help needed to walk in hospital room?: A Little Help needed climbing 3-5 steps with a railing? : A Lot 6 Click Score: 17    End of Session Equipment Utilized During Treatment: Gait belt Activity Tolerance: Patient tolerated treatment well Patient left: in bed Nurse Communication: Mobility status PT Visit Diagnosis: Muscle weakness (generalized) (M62.81);Pain Pain - Right/Left: Right Pain - part of body: Hip     Time: 5859-2924 PT Time Calculation (min) (ACUTE ONLY): 27 min  Charges:  $Gait Training: 23-37 mins                     Salem Caster. Fairly IV, PT, DPT Physical Therapist- [facility]  07/17/2022, 4:13 PM

## 2022-07-17 NOTE — Progress Notes (Signed)
Occupational Therapy Treatment Patient Details Name: Debra [name] MRN: [id number] DOB: Nov 19, 1965 Today's Date: 07/17/2022   History of present illness Pt is a 56 y.o. female with PMH including hypothyroidism, iron deficient anemia, colon cancer.  Here s/p mass removal with hernia repair and appendectomy 6/8, bowel perforation repair 6/10. [name] pouch was initially placed 6/20 and changed on 7/10. Re-evaluation consult placed as pt with acute back pain and muscle spasms, not able to get OOB in 3 days. Pt is POD 148.   OT comments  Patient in bed upon arrival, agreeable to OT treatment. Spanish interpreter utilized during session. Educated on log rolling to adhere to back precautions, unable to follow commands, requiring mod A for all bed mobility. Transferred sit<> stand with supervision. Maintained standing balance for ~10 minutes. Ambulated ~20 ft in room  and transferred to recliner with min guard/ supervision. Patient educated on using call bell for bathroom use. Patient left in recliner with call bell in reach, bed alarm set, and all needs met.    Recommendations for follow up therapy are one component of a multi-disciplinary discharge planning process, led by the attending physician.  Recommendations may be updated based on patient status, additional functional criteria and insurance authorization.    Follow Up Recommendations  Home health OT    Assistance Recommended at Discharge Frequent or constant Supervision/Assistance  Patient can return home with the following  Assistance with cooking/housework;Assist for transportation;A little help with walking and/or transfers;A little help with bathing/dressing/bathroom   Equipment Recommendations  None recommended by OT       Precautions / Restrictions Precautions Precautions: Fall Precaution Comments: PICC line R UE, [name] pouch abdomen Restrictions Weight Bearing Restrictions: No RUE Weight Bearing: Partial weight  bearing Other Position/Activity Restrictions: Abdominal surgery/[name] pouch       Mobility Bed Mobility Overal bed mobility: Needs Assistance Bed Mobility: Rolling Rolling: Mod assist Sidelying to sit: Mod assist, HOB elevated            Transfers Overall transfer level: Needs assistance Equipment used: Rolling walker (2 wheels) Transfers: Sit to/from Stand Sit to Stand: Supervision                 Balance Overall balance assessment: Needs assistance Sitting-balance support: Feet supported Sitting balance-Leahy Scale: Fair Sitting balance - Comments: Able to maintain static sitting with supervision   Standing balance support: Bilateral upper extremity supported, During functional activity Standing balance-Leahy Scale: Fair                             ADL either performed or assessed with clinical judgement   ADL Overall ADL's : Needs assistance/impaired                     Lower Body Dressing: Total assistance Lower Body Dressing Details (indicate cue type and reason): for time management                    Extremity/Trunk Assessment Upper Extremity Assessment Upper Extremity Assessment: Generalized weakness   Lower Extremity Assessment Lower Extremity Assessment: Generalized weakness         Cognition Arousal/Alertness: Awake/alert Behavior During Therapy: WFL for tasks assessed/performed Overall Cognitive Status: No family/caregiver present to determine baseline cognitive functioning Area of Impairment: Safety/judgement, Following commands, Awareness                       Following Commands:  Follows one step commands inconsistently Safety/Judgement: Decreased awareness of safety, Decreased awareness of deficits Awareness: Intellectual                       Pertinent Vitals/ Pain       Pain Assessment Pain Assessment: Faces Faces Pain Scale: Hurts little more Pain Location: back Pain Descriptors /  Indicators: Grimacing, Discomfort Pain Intervention(s): Limited activity within patient's tolerance, Monitored during session, Repositioned, Premedicated before session   Frequency  Min 2X/week        Progress Toward Goals  OT Goals(current goals can now be found in the care plan section)     Acute Rehab OT Goals Patient Stated Goal: to get better OT Goal Formulation: With patient Time For Goal Achievement: 07/28/22 Potential to Achieve Goals: Fair   AM-PAC OT "6 Clicks" Daily Activity     Outcome Measure   Help from another person eating meals?: None Help from another person taking care of personal grooming?: A Little Help from another person toileting, which includes using toliet, bedpan, or urinal?: A Lot Help from another person bathing (including washing, rinsing, drying)?: A Lot Help from another person to put on and taking off regular upper body clothing?: A Little Help from another person to put on and taking off regular lower body clothing?: A Lot 6 Click Score: 16    End of Session Equipment Utilized During Treatment: Other (comment) (Interpreter)  OT Visit Diagnosis: Unsteadiness on feet (R26.81);Pain Pain - part of body:  (back)   Activity Tolerance Patient tolerated treatment well   Patient Left in chair;with call bell/phone within reach;with chair alarm set   Nurse Communication Mobility status;Other (comment) (Purewick removed, educated on log rolling)      Time: 2641-5830 OT Time Calculation (min): 34 min  Charges: OT General Charges $OT Visit: 1 Visit OT Treatments $Self Care/Home Management : 23-37 mins    Jaleigha Deane, OTS 07/17/2022, 11:25 AM

## 2022-07-17 NOTE — Progress Notes (Signed)
PHARMACY - TOTAL PARENTERAL NUTRITION CONSULT NOTE   Indication: Prolonged ileus  Patient Measurements: Height: '4\' 11"'$  (149.9 cm) Weight: 97 kg (213 lb 13.5 oz) IBW/kg (Calculated) : 43.2 TPN AdjBW (KG): 55.2 Body mass index is 43.19 kg/m.  Assessment: Debra [name] is a 56 y.o. female s/p laparotomy, excision of greater omental mass, abdominal wall reconstruction with [name], appendectomy, and placement of Prevena vac.  Glucose / Insulin: No apparent history of diabetes.  BG 133 - 245   with SSI 4x/day + 10u insulin in TPN. 13 units SSI/24 hrs.   Electrolytes: Within normal limits Renal: Scr 0.5 -->1.33 > 1.26 Hepatic: No transaminitis. LFTs within normal limits. TG within normal limits GI Imaging: 9/11 CTAP: no new acute issues GI Surgeries / Procedures: s/p laparotomy, excision of greater omental mass, abdominal wall reconstruction with [name], appendectomy, and placement of Prevena vac  Central access: 02/15/22 TPN start date: 02/15/22  Nutritional Goals: Goal cyclic TPN over 16 hrs: cyclic TPN over 16 hrs: (provides 102 g of protein and 1817 kcals per day) total volume over 16hrs for calculations=1400 ml(1500 ml total with overfill)  RD Assessment:  Estimated Needs Total Energy Estimated Needs: 1800-2100kcal/day Total Protein Estimated Needs: 90-110g/d Total Fluid Estimated Needs: 1.4-1.6L/day  Current Nutrition:  Soft diet + nutritional supplements, not meeting PO needs yet  Patient walks around in afternoon/evening On loperamide q6h scheduled started 7/18  Plan: Transitioned to *Cyclic* TPN on 6/16.   to run over 16 hours per MD request. [name] at 2000 per discussion with Dietician to allow patient time for walking in afternoon/evenings.  To run over 16 hours: -Start rate at 49 mL/hr for 1 hour. -Increase rate to 98 mL/hr for 13 hours.  -Decrease rate to 49 mL/hr for 1 hour. -Decrease rate to 25 mL/hr for 1 hours, then  stop.  Nutritional Components Amino acids (using 15% Clinisol): 102 g Dextrose 19% = 266 g Lipids (using 20% SMOFlipids): 50.4 g kCal: 1817/24h  Electrolytes in TPN (standard): Na 623mq/L, K 529m/L, Ca 23m22mL, Mg 10 mEq/L, Phos 10 mmol/L. Cl:Ac 1:1 Add MVI, trace elements, and chromium to TPN  Continue CBG/SSI for Cyclic TPN: SSI  -CBG 2 hrs after cyclic TPN start -CBG during middle of cyclic TPN infusion -CBG 1 hr after cylic TPN stopped -CBG while off TPN (=4 CBGs per 24 hr period) SSI moderate + 10u insulin in TPN Check TPN labs on Mon/Thurs at minimum Monitor for further need of diuresis and adjust as needed for fluid status Continues Lasix 20 mg PO BID added 10/13  [name]/05/2022 8:08 AM

## 2022-07-17 NOTE — Progress Notes (Signed)
[facility] SURGICAL PROGRESS NOTE (cpt [phone])  Hospital Day(s): 154.   Post op day(s): 152 Days Post-Op.   Interval History:  Patient seen and examined [name] intact; Managing effluent with intermittent suction [name] last changed on 11/06 Labs this morning reassuring; electrolytes normal  She is on soft diet; on cyclic TPN More difficulty ambulating secondary to back pain; PT following again; recommending HHPT Appreciate neurosurgery recommendations  Review of Systems:  Constitutional: denies fever, chills  HEENT: denies cough or congestion  Respiratory: denies any shortness of breath  Cardiovascular: denies chest pain or palpitations  Gastrointestinal: denies abdominal pain, N/V Genitourinary: denies burning with urination or urinary frequency Integumentary: + midline wound MSK: + Back Pain   Vital signs in last 24 hours: [min-max] current  Temp:  [97.9 F (36.6 C)-98.4 F (36.9 C)] 97.9 F (36.6 C) (11/09 0700) Pulse Rate:  [80-90] 80 (11/09 0700) Resp:  [18-20] 19 (11/09 0700) BP: (112-124)/(60-78) 117/60 (11/09 0700) SpO2:  [92 %-100 %] 96 % (11/09 0700) Weight:  [97 kg] 97 kg (11/09 0500)     Height: '4\' 11"'$  (149.9 cm) Weight: 97 kg BMI (Calculated): 43.04   Intake/Output last 2 shifts:  11/08 0701 - 11/09 0700 In: 963.4 [I.V.:963.4] Out: 1700 [Urine:600; Drains:1100]   Physical Exam:  Constitutional: alert, cooperative and no distress  Respiratory: breathing non-labored at rest  Cardiovascular: regular rate and sinus rhythm  Gastrointestinal: Soft, abdominal soreness on the right, non-distended, no rebound/guarding. Integumentary: Midline wound open, peritoneum closed; granulating; there are three stomatized areas visible in the LUQ and LLQ portions of the wound, output remains feculent. There is hypergranulation tissue to the inferior wound edge.   Labs:     Latest Ref Rng & Units 07/13/2022    7:05 AM 07/02/2022   10:13 AM 06/17/2022     5:03 AM  CBC  WBC 4.0 - 10.5 K/uL 4.5  3.7  2.7   Hemoglobin 12.0 - 15.0 g/dL 9.9  10.2  8.1   Hematocrit 36.0 - 46.0 % 31.1  32.6  25.6   Platelets 150 - 400 K/uL 115  84  69       Latest Ref Rng & Units 07/16/2022    9:26 AM 07/15/2022    6:24 AM 07/14/2022    3:27 PM  CMP  Glucose 70 - 99 mg/dL 221  229  128   BUN 6 - 20 mg/dL 62  60  54   Creatinine 0.44 - 1.00 mg/dL 1.26  1.33  1.30   Sodium 135 - 145 mmol/L 141  141  143   Potassium 3.5 - 5.1 mmol/L 3.5  3.2  3.0   Chloride 98 - 111 mmol/L 109  110  109   CO2 22 - 32 mmol/L '25  23  27   '$ Calcium 8.9 - 10.3 mg/dL 8.8  8.9  9.1   Total Protein 6.5 - 8.1 g/dL  6.9  7.3   Total Bilirubin 0.3 - 1.2 mg/dL  1.1  1.2   Alkaline Phos 38 - 126 U/L  146  161   AST 15 - 41 U/L  35  42   ALT 0 - 44 U/L  34  38     Imaging studies: No new pertinent imaging studies  Assessment/Plan:  56 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   - New 11/09: No new changes  - Wound Care: Last change 11/06; pictures added. [name] pouch with "window" but not on intermittent or continuous suction. Patient seems to like this better. She will need intermittent suction with Yankauer to control effluent. Likely be beneficial to suction before ambulation.    - On Soft diet + nutritional supplementation - Continue TPN; cyclic - Appreciate dietary assistance with trace elements; iron supplementation            - Monitor abdominal examination; on-going bowel function            - Pain control prn; antemetic prn            - Therapies back on board; recommending HHPT; continue to follow    - Discharge Planning: Anticipate lengthy admission and potential eventual transfer; no new progress regarding  disposition    All of the above findings and recommendations were discussed with the patient, and the medical team, and all of patient's questions were answered to her expressed satisfaction.  -- [name], PA-C [facility] 07/17/2022, 8:35 AM M-F: 7am - 4pm

## 2022-07-17 NOTE — Plan of Care (Signed)
  Problem: Clinical Measurements: Goal: Will remain free from infection Outcome: Progressing   Problem: Nutrition: Goal: Adequate nutrition will be maintained Outcome: Progressing   Problem: Activity: Goal: Risk for activity intolerance will decrease Outcome: Progressing   Problem: Nutrition: Goal: Adequate nutrition will be maintained Outcome: Progressing   Problem: Coping: Goal: Level of anxiety will decrease Outcome: Progressing

## 2022-07-18 LAB — BASIC METABOLIC PANEL
Anion gap: 5 (ref 5–15)
BUN: 51 mg/dL — ABNORMAL HIGH (ref 6–20)
CO2: 24 mmol/L (ref 22–32)
Calcium: 8.5 mg/dL — ABNORMAL LOW (ref 8.9–10.3)
Chloride: 110 mmol/L (ref 98–111)
Creatinine, Ser: 1 mg/dL (ref 0.44–1.00)
GFR, Estimated: >60 mL/min (ref >60)
Glucose, Bld: 213 mg/dL — ABNORMAL HIGH (ref 70–99)
Potassium: 3.1 mmol/L — ABNORMAL LOW (ref 3.5–5.1)
Sodium: 139 mmol/L (ref 135–145)

## 2022-07-18 LAB — GLUCOSE, CAPILLARY
Glucose-Capillary: 155 mg/dL — ABNORMAL HIGH (ref 70–99)
Glucose-Capillary: 234 mg/dL — ABNORMAL HIGH (ref 70–99)

## 2022-07-18 MED ORDER — TRACE MINERALS CU-MN-SE-ZN 300-55-60-3000 MCG/ML IV SOLN
INTRAVENOUS | Status: AC
  Filled 2022-07-18: qty 681.33

## 2022-07-18 MED ORDER — POTASSIUM CHLORIDE CRYS ER 20 MEQ PO TBCR
40.0000 meq | EXTENDED_RELEASE_TABLET | Freq: Once | ORAL | Status: AC
  Administered 2022-07-18: 40 meq via ORAL
  Filled 2022-07-18: qty 2

## 2022-07-18 NOTE — Progress Notes (Addendum)
[facility] SURGICAL PROGRESS NOTE (cpt [phone])  Hospital Day(s): 155.   Post op day(s): 153 Days Post-Op.   Interval History:  Patient seen and examined [name] intact; Managing effluent with intermittent suction; output 600 ccs [name] last changed on 11/06 Mild hypokalemia to 3.1 this AM Renal function normal; sCr - 1.00 She is on soft diet; on cyclic TPN More difficulty ambulating secondary to back pain; PT following again; recommending HHPT Appreciate neurosurgery recommendations  Review of Systems:  Constitutional: denies fever, chills  HEENT: denies cough or congestion  Respiratory: denies any shortness of breath  Cardiovascular: denies chest pain or palpitations  Gastrointestinal: denies abdominal pain, N/V Genitourinary: denies burning with urination or urinary frequency Integumentary: + midline wound MSK: + Back Pain   Vital signs in last 24 hours: [min-max] current  Temp:  [98 F (36.7 C)-98.4 F (36.9 C)] 98 F (36.7 C) (11/09 2030) Pulse Rate:  [87-102] 87 (11/09 2030) Resp:  [19-20] 20 (11/09 2030) BP: (112)/(70-75) 112/70 (11/09 2030) SpO2:  [97 %-100 %] 97 % (11/09 2030)     Height: '4\' 11"'$  (149.9 cm) Weight: 97 kg BMI (Calculated): 43.04   Intake/Output last 2 shifts:  11/09 0701 - 11/10 0700 In: 1116.2 [I.V.:1116.2] Out: 940 [Urine:10; Drains:680; Stool:250]   Physical Exam:  Constitutional: alert, cooperative and no distress  Respiratory: breathing non-labored at rest  Cardiovascular: regular rate and sinus rhythm  Gastrointestinal: Soft, abdominal soreness on the right, non-distended, no rebound/guarding. Integumentary: Midline wound open, peritoneum closed; granulating; there are three stomatized areas visible in the LUQ and LLQ portions of the wound, output remains feculent. There is hypergranulation tissue to the inferior wound edge.   Labs:     Latest Ref Rng & Units 07/13/2022    7:05 AM 07/02/2022   10:13 AM 06/17/2022    5:03  AM  CBC  WBC 4.0 - 10.5 K/uL 4.5  3.7  2.7   Hemoglobin 12.0 - 15.0 g/dL 9.9  10.2  8.1   Hematocrit 36.0 - 46.0 % 31.1  32.6  25.6   Platelets 150 - 400 K/uL 115  84  69       Latest Ref Rng & Units 07/18/2022    5:00 AM 07/16/2022    9:26 AM 07/15/2022    6:24 AM  CMP  Glucose 70 - 99 mg/dL 213  221  229   BUN 6 - 20 mg/dL 51  62  60   Creatinine 0.44 - 1.00 mg/dL 1.00  1.26  1.33   Sodium 135 - 145 mmol/L 139  141  141   Potassium 3.5 - 5.1 mmol/L 3.1  3.5  3.2   Chloride 98 - 111 mmol/L 110  109  110   CO2 22 - 32 mmol/L '24  25  23   '$ Calcium 8.9 - 10.3 mg/dL 8.5  8.8  8.9   Total Protein 6.5 - 8.1 g/dL   6.9   Total Bilirubin 0.3 - 1.2 mg/dL   1.1   Alkaline Phos 38 - 126 U/L   146   AST 15 - 41 U/L   35   ALT 0 - 44 U/L   34     Imaging studies: No new pertinent imaging studies  Assessment/Plan:  56 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   - New 11/10: Replete K+; nothing further otherwise   - Wound Care: Last change 11/06; pictures added. [name] pouch with "window" but not on intermittent or continuous suction. Patient seems to like this better. She will need intermittent suction with Yankauer to control effluent. Likely be beneficial to suction before ambulation.    - On Soft diet + nutritional supplementation - Continue TPN; cyclic - Appreciate dietary assistance with trace elements; iron supplementation            - Monitor abdominal examination; on-going bowel function            - Pain control prn; antemetic prn            - Therapies back on board; recommending HHPT; continue to follow    - Discharge Planning: Anticipate lengthy admission and potential eventual transfer; no new progress regarding  disposition    All of the above findings and recommendations were discussed with the patient, and the medical team, and all of patient's questions were answered to her expressed satisfaction.  -- [name], PA-C [facility] 07/18/2022, 7:27 AM M-F: 7am - 4pm

## 2022-07-18 NOTE — Plan of Care (Signed)
  Problem: Clinical Measurements: Goal: Will remain free from infection Outcome: Progressing   Problem: Nutrition: Goal: Adequate nutrition will be maintained Outcome: Progressing   Problem: Activity: Goal: Risk for activity intolerance will decrease Outcome: Progressing   Problem: Nutrition: Goal: Adequate nutrition will be maintained Outcome: Progressing   Problem: Coping: Goal: Level of anxiety will decrease Outcome: Progressing   Problem: Pain Managment: Goal: General experience of comfort will improve Outcome: Progressing

## 2022-07-18 NOTE — Progress Notes (Signed)
PHARMACY - TOTAL PARENTERAL NUTRITION CONSULT NOTE   Indication: Prolonged ileus  Patient Measurements: Height: '4\' 11"'$  (149.9 cm) Weight: 97 kg (213 lb 13.5 oz) IBW/kg (Calculated) : 43.2 TPN AdjBW (KG): 55.2 Body mass index is 43.19 kg/m.  Assessment: Debra [name] is a 56 y.o. female s/p laparotomy, excision of greater omental mass, abdominal wall reconstruction with [name], appendectomy, and placement of Prevena vac.  Glucose / Insulin: No apparent history of diabetes.  BG 133 - 245   with SSI 4x/day + 10u insulin in TPN. 13 units SSI/24 hrs.   Electrolytes: Within normal limits Renal: Scr 0.5 -->1.33 > 1.26 Hepatic: No transaminitis. LFTs within normal limits. TG within normal limits GI Imaging: 9/11 CTAP: no new acute issues GI Surgeries / Procedures: s/p laparotomy, excision of greater omental mass, abdominal wall reconstruction with [name], appendectomy, and placement of Prevena vac  Central access: 02/15/22 TPN start date: 02/15/22  Nutritional Goals: Goal cyclic TPN over 16 hrs: cyclic TPN over 16 hrs: (provides 102 g of protein and 1817 kcals per day) total volume over 16hrs for calculations=1400 ml(1500 ml total with overfill)  RD Assessment:  Estimated Needs Total Energy Estimated Needs: 1800-2100kcal/day Total Protein Estimated Needs: 90-110g/d Total Fluid Estimated Needs: 1.4-1.6L/day  Current Nutrition:  Soft diet + nutritional supplements, not meeting PO needs yet  Patient walks around in afternoon/evening On loperamide q6h scheduled started 7/18  Plan: Transitioned to *Cyclic* TPN on 2/24.   to run over 16 hours per MD request. [name] at 2000 per discussion with Dietician to allow patient time for walking in afternoon/evenings.  To run over 16 hours: -Start rate at 49 mL/hr for 1 hour. -Increase rate to 98 mL/hr for 13 hours.  -Decrease rate to 49 mL/hr for 1 hour. -Decrease rate to 25 mL/hr for 1 hours, then  stop.  Nutritional Components Amino acids (using 15% Clinisol): 102 g Dextrose 19% = 266 g Lipids (using 20% SMOFlipids): 50.4 g kCal: 1817/24h  Electrolytes in TPN (standard): Na 53mq/L, K 530m/L, Ca 7m80mL, Mg 10 mEq/L, Phos 10 mmol/L. Cl:Ac 1:1 Add MVI, trace elements, and chromium to TPN  Continue CBG/SSI for Cyclic TPN: SSI  -CBG 2 hrs after cyclic TPN start -CBG during middle of cyclic TPN infusion -CBG 1 hr after cylic TPN stopped -CBG while off TPN (=4 CBGs per 24 hr period) SSI moderate + 10u insulin in TPN Check TPN labs on Mon/Thurs at minimum Replace potassium with 40 mEq oral KCl Monitor for further need of diuresis and adjust as needed for fluid status Continues Lasix 20 mg PO BID added 10/13  [name]/06/2022 7:02 AM

## 2022-07-19 ENCOUNTER — Inpatient Hospital Stay: Payer: Medicaid Other

## 2022-07-19 DIAGNOSIS — R1110 Vomiting, unspecified: Secondary | ICD-10-CM

## 2022-07-19 LAB — BASIC METABOLIC PANEL
Anion gap: 10 (ref 5–15)
BUN: 47 mg/dL — ABNORMAL HIGH (ref 6–20)
CO2: 20 mmol/L — ABNORMAL LOW (ref 22–32)
Calcium: 9.2 mg/dL (ref 8.9–10.3)
Chloride: 111 mmol/L (ref 98–111)
Creatinine, Ser: 0.98 mg/dL (ref 0.44–1.00)
GFR, Estimated: >60 mL/min (ref >60)
Glucose, Bld: 155 mg/dL — ABNORMAL HIGH (ref 70–99)
Potassium: 4.9 mmol/L (ref 3.5–5.1)
Sodium: 141 mmol/L (ref 135–145)

## 2022-07-19 LAB — GLUCOSE, CAPILLARY
Glucose-Capillary: 148 mg/dL — ABNORMAL HIGH (ref 70–99)
Glucose-Capillary: 169 mg/dL — ABNORMAL HIGH (ref 70–99)

## 2022-07-19 LAB — MAGNESIUM: Magnesium: 2.5 mg/dL — ABNORMAL HIGH (ref 1.7–2.4)

## 2022-07-19 MED ORDER — LIDOCAINE 5 % EX PTCH
1.0000 | MEDICATED_PATCH | CUTANEOUS | Status: DC
  Administered 2022-07-19 – 2022-07-21 (×3): 1 via TRANSDERMAL
  Filled 2022-07-19 (×3): qty 1

## 2022-07-19 MED ORDER — TRACE MINERALS CU-MN-SE-ZN 300-55-60-3000 MCG/ML IV SOLN
INTRAVENOUS | Status: AC
  Filled 2022-07-19: qty 681.33

## 2022-07-19 NOTE — Progress Notes (Signed)
PHARMACY - TOTAL PARENTERAL NUTRITION CONSULT NOTE   Indication: Prolonged ileus  Patient Measurements: Height: '4\' 11"'$  (149.9 cm) Weight: 97 kg (213 lb 13.5 oz) IBW/kg (Calculated) : 43.2 TPN AdjBW (KG): 55.2 Body mass index is 43.19 kg/m.  Assessment: Debra [name] is a 56 y.o. female s/p laparotomy, excision of greater omental mass, abdominal wall reconstruction with [name], appendectomy, and placement of Prevena vac.  Glucose / Insulin: No apparent history of diabetes.  BG 165-234   with SSI 4x/day + 10u insulin in TPN. 12 units SSI/24 hrs.   Electrolytes: Within normal limits Renal: Scr 0.5 -->1.33 > 1.26 > 1 Hepatic: No transaminitis. LFTs within normal limits. TG within normal limits GI Imaging: 9/11 CTAP: no new acute issues GI Surgeries / Procedures: s/p laparotomy, excision of greater omental mass, abdominal wall reconstruction with [name], appendectomy, and placement of Prevena vac  Central access: 02/15/22 TPN start date: 02/15/22  Nutritional Goals: Goal cyclic TPN over 16 hrs: cyclic TPN over 16 hrs: (provides 102 g of protein and 1817 kcals per day) total volume over 16hrs for calculations=1400 ml(1500 ml total with overfill)  RD Assessment:  Estimated Needs Total Energy Estimated Needs: 1800-2100kcal/day Total Protein Estimated Needs: 90-110g/d Total Fluid Estimated Needs: 1.4-1.6L/day  Current Nutrition:  Soft diet + nutritional supplements, not meeting PO needs yet  Patient walks around in afternoon/evening On loperamide q6h scheduled started 7/18  Plan: Transitioned to *Cyclic* TPN on 4/08.   to run over 16 hours per MD request. [name] at 2000 per discussion with Dietician to allow patient time for walking in afternoon/evenings.  To run over 16 hours: -Start rate at 49 mL/hr for 1 hour. -Increase rate to 98 mL/hr for 13 hours.  -Decrease rate to 49 mL/hr for 1 hour. -Decrease rate to 25 mL/hr for 1 hours, then  stop.  Nutritional Components Amino acids (using 15% Clinisol): 102 g Dextrose 19% = 266 g Lipids (using 20% SMOFlipids): 50.4 g kCal: 1817/24h  Electrolytes in TPN (standard): Na 62mq/L, K 562m/L, Ca 34m40mL, Mg 10 mEq/L, Phos 10 mmol/L. Cl:Ac 1:1 Add MVI, trace elements, and chromium to TPN  Continue CBG/SSI for Cyclic TPN: SSI  -CBG 2 hrs after cyclic TPN start -CBG during middle of cyclic TPN infusion -CBG 1 hr after cylic TPN stopped -CBG while off TPN (=4 CBGs per 24 hr period) SSI moderate + 10u insulin in TPN Check TPN labs on Mon/Thurs at minimum Replace potassium with 40 mEq oral KCl Monitor for further need of diuresis and adjust as needed for fluid status Continues Lasix 20 mg PO BID added 10/13  KisOswald HillockharmD, BCPS 07/19/2022 9:01 AM

## 2022-07-19 NOTE — Plan of Care (Signed)
  Problem: Clinical Measurements: Goal: Will remain free from infection Outcome: Progressing   Problem: Nutrition: Goal: Adequate nutrition will be maintained Outcome: Progressing   Problem: Activity: Goal: Risk for activity intolerance will decrease Outcome: Progressing   Problem: Nutrition: Goal: Adequate nutrition will be maintained Outcome: Progressing   Problem: Coping: Goal: Level of anxiety will decrease Outcome: Progressing   Problem: Pain Managment: Goal: General experience of comfort will improve Outcome: Progressing

## 2022-07-19 NOTE — Progress Notes (Signed)
[facility] SURGICAL PROGRESS NOTE (cpt [phone])  Hospital Day(s): 156.   Post op day(s): 154 Days Post-Op.   Interval History:  Patient had episode of emesis last night.  This morning denies any nausea. No worsening pain.  Does report pain in her back.   Vital signs in last 24 hours: [min-max] current  Temp:  [97.7 F (36.5 C)-98.2 F (36.8 C)] 98.2 F (36.8 C) (11/11 0837) Pulse Rate:  [89-93] 89 (11/11 0837) Resp:  [16-18] 18 (11/11 0837) BP: (121-135)/(75-88) 121/75 (11/11 0837) SpO2:  [96 %-99 %] 96 % (11/11 0837)     Height: '4\' 11"'$  (149.9 cm) Weight: 97 kg BMI (Calculated): 43.04   Intake/Output last 2 shifts:  11/10 0701 - 11/11 0700 In: 964 [P.O.:120; I.V.:844] Out: 1200 [Emesis/NG output:900; Drains:300]   Physical Exam:  Constitutional: alert, cooperative and no distress  Respiratory: breathing non-labored at rest  Cardiovascular: regular rate and sinus rhythm  Gastrointestinal: Soft, abdominal soreness on the right, non-distended, no rebound/guarding. Integumentary: Midline wound open, peritoneum closed; granulating; there are three stomatized areas visible in the LUQ and LLQ portions of the wound, output remains feculent. There is hypergranulation tissue to the inferior wound edge.   Labs:     Latest Ref Rng & Units 07/13/2022    7:05 AM 07/02/2022   10:13 AM 06/17/2022    5:03 AM  CBC  WBC 4.0 - 10.5 K/uL 4.5  3.7  2.7   Hemoglobin 12.0 - 15.0 g/dL 9.9  10.2  8.1   Hematocrit 36.0 - 46.0 % 31.1  32.6  25.6   Platelets 150 - 400 K/uL 115  84  69       Latest Ref Rng & Units 07/18/2022    5:00 AM 07/16/2022    9:26 AM 07/15/2022    6:24 AM  CMP  Glucose 70 - 99 mg/dL 213  221  229   BUN 6 - 20 mg/dL 51  62  60   Creatinine 0.44 - 1.00 mg/dL 1.00  1.26  1.33   Sodium 135 - 145 mmol/L 139  141  141   Potassium 3.5 - 5.1 mmol/L 3.1  3.5  3.2   Chloride 98 - 111 mmol/L 110  109  110   CO2 22 - 32 mmol/L '24  25  23   '$ Calcium 8.9 - 10.3  mg/dL 8.5  8.8  8.9   Total Protein 6.5 - 8.1 g/dL   6.9   Total Bilirubin 0.3 - 1.2 mg/dL   1.1   Alkaline Phos 38 - 126 U/L   146   AST 15 - 41 U/L   35   ALT 0 - 44 U/L   34     Imaging studies: No new pertinent imaging studies  Assessment/Plan:  56 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  -- Discussed with the patient sometimes the pain from her back compression fracture could result in some nausea particular if she is requiring some more pain medication that could be causing constipation.  For now, we will continue to monitor this.  Discussed that it is okay to continue diet for now but recommended that she take small bites. - Continue TPN cycling at nighttime. - Out of bed as tolerated continue working with PT  I  spent 35 minutes dedicated to the care of this patient on the date of this encounter to include pre-visit review of records, face-to-face time with the patient discussing diagnosis and management, and any post-visit coordination of care.  Olean Ree, MD

## 2022-07-20 LAB — ZINC: Zinc: 69 ug/dL (ref 44–115)

## 2022-07-20 LAB — GLUCOSE, CAPILLARY: Glucose-Capillary: 140 mg/dL — ABNORMAL HIGH (ref 70–99)

## 2022-07-20 LAB — COPPER, SERUM: Copper: 121 ug/dL (ref 80–158)

## 2022-07-20 LAB — POTASSIUM: Potassium: 4.1 mmol/L (ref 3.5–5.1)

## 2022-07-20 MED ORDER — TRACE MINERALS CU-MN-SE-ZN 300-55-60-3000 MCG/ML IV SOLN
INTRAVENOUS | Status: AC
  Filled 2022-07-20: qty 681.33

## 2022-07-20 NOTE — Progress Notes (Signed)
[facility] SURGICAL PROGRESS NOTE (cpt [phone])  Hospital Day(s): 157.   Post op day(s): 155 Days Post-Op.   Interval History:  Patient yesterday had some episodes of emesis.  She was made n.p.o. and KUB was obtained which showed some gas-filled loops of bowel.  Her fistula output had also decreased so that during the day.  Overnight, no further episodes of nausea or emesis and this morning she feels better.  Lidocaine patch was started for her back pain to try decreasing her narcotics.  It is somewhat working per the patient although she did require oxycodone and morphine earlier today.   Vital signs in last 24 hours: [min-max] current  Temp:  [97.8 F (36.6 C)-98.5 F (36.9 C)] 97.8 F (36.6 C) (11/12 0810) Pulse Rate:  [80-94] 89 (11/12 0810) Resp:  [16-19] 19 (11/12 0810) BP: (101-127)/(64-87) 116/73 (11/12 0810) SpO2:  [96 %-97 %] 96 % (11/12 0810) Weight:  [93.9 kg] 93.9 kg (11/12 0500)     Height: '4\' 11"'$  (149.9 cm) Weight: 93.9 kg BMI (Calculated): 43.04   Intake/Output last 2 shifts:  11/11 0701 - 11/12 0700 In: 1438.4 [P.O.:60; I.V.:1378.4] Out: 1500 [Urine:700; Drains:800]   Physical Exam:  Constitutional: alert, cooperative and no distress  Respiratory: breathing non-labored at rest  Cardiovascular: regular rate and sinus rhythm  Gastrointestinal: Soft, abdominal soreness on the right, non-distended, no rebound/guarding. Integumentary: Midline wound open, peritoneum closed; granulating; there are three stomatized areas visible in the LUQ and LLQ portions of the wound, output remains feculent. There is hypergranulation tissue to the inferior wound edge.   Labs:     Latest Ref Rng & Units 07/13/2022    7:05 AM 07/02/2022   10:13 AM 06/17/2022    5:03 AM  CBC  WBC 4.0 - 10.5 K/uL 4.5  3.7  2.7   Hemoglobin 12.0 - 15.0 g/dL 9.9  10.2  8.1   Hematocrit 36.0 - 46.0 % 31.1  32.6  25.6   Platelets 150 - 400 K/uL 115  84  69       Latest Ref Rng &  Units 07/20/2022   12:23 PM 07/19/2022    5:50 PM 07/18/2022    5:00 AM  CMP  Glucose 70 - 99 mg/dL  155  213   BUN 6 - 20 mg/dL  47  51   Creatinine 0.44 - 1.00 mg/dL  0.98  1.00   Sodium 135 - 145 mmol/L  141  139   Potassium 3.5 - 5.1 mmol/L 4.1  4.9  3.1   Chloride 98 - 111 mmol/L  111  110   CO2 22 - 32 mmol/L  20  24   Calcium 8.9 - 10.3 mg/dL  9.2  8.5     Imaging studies: No new pertinent imaging studies  Assessment/Plan:  56 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  -- Patient clinically improved this morning with no further nausea or emesis overnight or this morning.  We will start her again on clear liquid diet today and continue with TPN cycling at nighttime. - Continue lidocaine patch for today and I think if no improvement tomorrow, then we will consider potential prednisone taper such as a Medrol pack.  We will discuss this with neurosurgery first to make sure this would not  interfere with the compression fracture that the patient has. - Out of bed as tolerated continue working with PT  I spent 35 minutes dedicated to the care of this patient on the date of this encounter to include pre-visit review of records, face-to-face time with the patient discussing diagnosis and management, and any post-visit coordination of care.  Olean Ree, MD

## 2022-07-20 NOTE — Progress Notes (Signed)
PHARMACY - TOTAL PARENTERAL NUTRITION CONSULT NOTE   Indication: Prolonged ileus  Patient Measurements: Height: '4\' 11"'$  (149.9 cm) Weight: 93.9 kg (207 lb 0.2 oz) IBW/kg (Calculated) : 43.2 TPN AdjBW (KG): 55.2 Body mass index is 41.81 kg/m.  Assessment: Debra [name] is a 56 y.o. female s/p laparotomy, excision of greater omental mass, abdominal wall reconstruction with [name], appendectomy, and placement of Prevena vac.  Glucose / Insulin: No apparent history of diabetes.  BG 165-234 > 140-160  with SSI 4x/day + 10u insulin in TPN. 14 units SSI/24 hrs. (Insulin usage trending up)  Electrolytes: Within normal limits Renal: Scr 0.5 -->1.33 > 1.26 > 1 Hepatic: No transaminitis. LFTs within normal limits. TG within normal limits GI Imaging: 9/11 CTAP: no new acute issues GI Surgeries / Procedures: s/p laparotomy, excision of greater omental mass, abdominal wall reconstruction with [name], appendectomy, and placement of Prevena vac  Central access: 02/15/22 TPN start date: 02/15/22  Nutritional Goals: Goal cyclic TPN over 16 hrs: cyclic TPN over 16 hrs: (provides 102 g of protein and 1817 kcals per day) total volume over 16hrs for calculations=1400 ml(1500 ml total with overfill)  RD Assessment:  Estimated Needs Total Energy Estimated Needs: 1800-2100kcal/day Total Protein Estimated Needs: 90-110g/d Total Fluid Estimated Needs: 1.4-1.6L/day  Current Nutrition:  Soft diet + nutritional supplements, not meeting PO needs yet  Patient walks around in afternoon/evening On loperamide q6h scheduled started 7/18  Plan: Transitioned to *Cyclic* TPN on 2/83.   to run over 16 hours per MD request. [name] at 2000 per discussion with Dietician to allow patient time for walking in afternoon/evenings.  To run over 16 hours: -Start rate at 49 mL/hr for 1 hour. -Increase rate to 98 mL/hr for 13 hours.  -Decrease rate to 49 mL/hr for 1 hour. -Decrease rate  to 25 mL/hr for 1 hours, then stop.  Nutritional Components Amino acids (using 15% Clinisol): 102 g Dextrose 19% = 266 g Lipids (using 20% SMOFlipids): 50.4 g kCal: 1817/24h  Electrolytes in TPN (standard): Na 46mq/L, K 52m/L, Ca 71m42mL, Mg 10 mEq/L, Phos 10 mmol/L. Cl:Ac 1:1 Add MVI, trace elements, and chromium to TPN  Continue CBG/SSI for Cyclic TPN: SSI  -CBG 2 hrs after cyclic TPN start -CBG during middle of cyclic TPN infusion -CBG 1 hr after cylic TPN stopped -CBG while off TPN (=4 CBGs per 24 hr period) SSI moderate + 10u insulin in TPN Check TPN labs on Mon/Thurs at minimum Replace potassium with 40 mEq oral KCl Monitor for further need of diuresis and adjust as needed for fluid status Continues Lasix 20 mg PO BID added 10/13  KisOswald HillockharmD, BCPS 07/20/2022 9:13 AM

## 2022-07-20 NOTE — Progress Notes (Signed)
Physical Therapy Treatment Patient Details Name: Debra [name] MRN: [id number] DOB: 1966-06-01 Today's Date: 07/20/2022   History of Present Illness Pt is a 56 y.o. female with PMH including hypothyroidism, iron deficient anemia, colon cancer.  Here s/p mass removal with hernia repair and appendectomy 6/8, bowel perforation repair 6/10. [name] pouch was initially placed 6/20 and changed on 7/10. Re-evaluation consult placed as pt with acute back pain and muscle spasms, not able to get OOB in 3 days. Pt is POD 148.    PT Comments    Pt tolerated treatment well today but required increased time/effort due to back spasms/pain and stress incontinent episode at EOB in standing (which she reports happens when she gets a back spasm). Significant paraspinal tone and TrP noted to bil paraspinals in sitting which is likely cause of back spasms and pain. Educated for pain management including: lidocaine patches, sitting in recliner, ambulating multiple times a day, and staying hydrated/drinking water. Pt able to improve overall ambulation distance since last session and ambulated 159f with RW; demonstrates improved gait speed and quality of gait with repetition and time. Despite progress, she continues to require increased assist for bed mobility secondary to back pain/spasms. Pt will continue to benefit from skilled acute PT services to address deficits for return to baseline function. Will continue to recommend HHPT at DC.   To reduce prolonged bed immobility and improve overall functional mobility, will recommend ambulating at least twice daily (with mobility specialist and nursing) in effort to improve paraspinal tone and back spasms. RN notified.      Recommendations for follow up therapy are one component of a multi-disciplinary discharge planning process, led by the attending physician.  Recommendations may be updated based on patient status, additional functional criteria and insurance  authorization.  Follow Up Recommendations  Home health PT Can patient physically be transported by private vehicle: Yes   Assistance Recommended at Discharge Set up Supervision/Assistance  Patient can return home with the following A little help with walking and/or transfers;A lot of help with bathing/dressing/bathroom   Equipment Recommendations  Rolling walker (2 wheels);BSC/3in1       Precautions / Restrictions Precautions Precautions: Fall Precaution Comments: PICC line R UE, [name] pouch abdomen Restrictions Weight Bearing Restrictions: No Other Position/Activity Restrictions: Abdominal surgery/[name] pouch     Mobility  Bed Mobility Overal bed mobility: Needs Assistance Bed Mobility: Supine to Sit     Supine to sit: Mod assist     General bed mobility comments: for hip facilitation to sit EOB, HOB elevated, BUE for support on handrail    Transfers Overall transfer level: Needs assistance Equipment used: Rolling walker (2 wheels) Transfers: Sit to/from Stand Sit to Stand: Supervision, Min guard           General transfer comment: increased time/effort due to pain    Ambulation/Gait Ambulation/Gait assistance: Supervision Gait Distance (Feet): 180 Feet Assistive device: Rolling walker (2 wheels)         General Gait Details: Supervision for safety to ambulate with RW; grossly slowed cadence with improved cadence and guarding with repetition/time.     Balance Overall balance assessment: Needs assistance Sitting-balance support: Feet supported Sitting balance-Leahy Scale: Fair Sitting balance - Comments: Able to maintain static sitting with supervision   Standing balance support: Bilateral upper extremity supported, During functional activity Standing balance-Leahy Scale: Fair Standing balance comment: light UE support on RW, increasing to moderate UE support with fatigue  Cognition Arousal/Alertness:  Awake/alert   Overall Cognitive Status: No family/caregiver present to determine baseline cognitive functioning                                          Exercises Other Exercises Other Exercises: Participates in bed mobility, transfers, and gait with RW. Increased time/effort due to stress incontinent episode at EOB Other Exercises: Pt educated re: PT role/POC, DC recommendations, progress, ambulation twice a day (once with mobility tech and once with nursing)        Pertinent Vitals/Pain Pain Assessment Pain Assessment: Faces Faces Pain Scale: Hurts even more Pain Location: back with movement due to back spasms Pain Descriptors / Indicators: Grimacing, Discomfort Pain Intervention(s): Limited activity within patient's tolerance, Monitored during session     PT Goals (current goals can now be found in the care plan section) Acute Rehab PT Goals Patient Stated Goal: to go home PT Goal Formulation: With patient Time For Goal Achievement: 07/27/22 Potential to Achieve Goals: Good Progress towards PT goals: Progressing toward goals    Frequency    Min 2X/week      PT Plan Current plan remains appropriate       AM-PAC PT "6 Clicks" Mobility   Outcome Measure  Help needed turning from your back to your side while in a flat bed without using bedrails?: A Little Help needed moving from lying on your back to sitting on the side of a flat bed without using bedrails?: A Lot Help needed moving to and from a bed to a chair (including a wheelchair)?: A Little Help needed standing up from a chair using your arms (e.g., wheelchair or bedside chair)?: A Little Help needed to walk in hospital room?: A Little Help needed climbing 3-5 steps with a railing? : A Lot 6 Click Score: 16    End of Session Equipment Utilized During Treatment: Gait belt Activity Tolerance: Patient tolerated treatment well;Patient limited by pain Patient left: in chair;with chair alarm  set;with call bell/phone within reach Nurse Communication: Mobility status PT Visit Diagnosis: Muscle weakness (generalized) (M62.81);Pain;Unsteadiness on feet (R26.81) Pain - Right/Left:  (back)     Time: 1401-1500 PT Time Calculation (min) (ACUTE ONLY): 59 min  Charges:  $Therapeutic Exercise: 23-37 mins $Therapeutic Activity: 23-37 mins                      Herminio Commons, PT, DPT 4:25 PM,07/20/22 Physical Therapist - [facility]

## 2022-07-21 LAB — GLUCOSE, CAPILLARY
Glucose-Capillary: 156 mg/dL — ABNORMAL HIGH (ref 70–99)
Glucose-Capillary: 185 mg/dL — ABNORMAL HIGH (ref 70–99)
Glucose-Capillary: 162 mg/dL — ABNORMAL HIGH (ref 70–99)
Glucose-Capillary: 156 mg/dL — ABNORMAL HIGH (ref 70–99)
Glucose-Capillary: 227 mg/dL — ABNORMAL HIGH (ref 70–99)
Glucose-Capillary: 201 mg/dL — ABNORMAL HIGH (ref 70–99)
Glucose-Capillary: 210 mg/dL — ABNORMAL HIGH (ref 70–99)
Glucose-Capillary: 125 mg/dL — ABNORMAL HIGH (ref 70–99)
Glucose-Capillary: 169 mg/dL — ABNORMAL HIGH (ref 70–99)
Glucose-Capillary: 229 mg/dL — ABNORMAL HIGH (ref 70–99)
Glucose-Capillary: 211 mg/dL — ABNORMAL HIGH (ref 70–99)
Glucose-Capillary: 129 mg/dL — ABNORMAL HIGH (ref 70–99)
Glucose-Capillary: 149 mg/dL — ABNORMAL HIGH (ref 70–99)

## 2022-07-21 LAB — COMPREHENSIVE METABOLIC PANEL
ALT: 29 U/L (ref 0–44)
AST: 30 U/L (ref 15–41)
Albumin: 2.9 g/dL — ABNORMAL LOW (ref 3.5–5.0)
Alkaline Phosphatase: 172 U/L — ABNORMAL HIGH (ref 38–126)
Anion gap: 7 (ref 5–15)
BUN: 56 mg/dL — ABNORMAL HIGH (ref 6–20)
CO2: 20 mmol/L — ABNORMAL LOW (ref 22–32)
Calcium: 9.1 mg/dL (ref 8.9–10.3)
Chloride: 112 mmol/L — ABNORMAL HIGH (ref 98–111)
Creatinine, Ser: 1.18 mg/dL — ABNORMAL HIGH (ref 0.44–1.00)
GFR, Estimated: 54 mL/min — ABNORMAL LOW (ref >60)
Glucose, Bld: 256 mg/dL — ABNORMAL HIGH (ref 70–99)
Potassium: 4 mmol/L (ref 3.5–5.1)
Sodium: 139 mmol/L (ref 135–145)
Total Bilirubin: 0.6 mg/dL (ref 0.3–1.2)
Total Protein: 7.3 g/dL (ref 6.5–8.1)

## 2022-07-21 LAB — PHOSPHORUS: Phosphorus: 4.2 mg/dL (ref 2.5–4.6)

## 2022-07-21 LAB — MAGNESIUM: Magnesium: 2.2 mg/dL (ref 1.7–2.4)

## 2022-07-21 MED ORDER — TRACE MINERALS CU-MN-SE-ZN 300-55-60-3000 MCG/ML IV SOLN
INTRAVENOUS | Status: AC
  Filled 2022-07-21: qty 681.33

## 2022-07-21 MED ORDER — INSULIN ASPART 100 UNIT/ML IJ SOLN
0.0000 [IU] | Freq: Four times a day (QID) | INTRAMUSCULAR | Status: DC
  Administered 2022-07-21: 3 [IU] via SUBCUTANEOUS
  Administered 2022-07-21: 7 [IU] via SUBCUTANEOUS
  Administered 2022-07-21: 3 [IU] via SUBCUTANEOUS
  Administered 2022-07-22: 11 [IU] via SUBCUTANEOUS
  Administered 2022-07-22 – 2022-07-23 (×3): 3 [IU] via SUBCUTANEOUS
  Administered 2022-07-23: 7 [IU] via SUBCUTANEOUS
  Administered 2022-07-23: 3 [IU] via SUBCUTANEOUS
  Administered 2022-07-23: 7 [IU] via SUBCUTANEOUS
  Administered 2022-07-24: 4 [IU] via SUBCUTANEOUS
  Administered 2022-07-24: 7 [IU] via SUBCUTANEOUS
  Administered 2022-07-24 – 2022-07-25 (×6): 4 [IU] via SUBCUTANEOUS
  Administered 2022-07-26: 7 [IU] via SUBCUTANEOUS
  Administered 2022-07-26 (×2): 4 [IU] via SUBCUTANEOUS
  Administered 2022-07-27 (×2): 3 [IU] via SUBCUTANEOUS
  Administered 2022-07-27: 4 [IU] via SUBCUTANEOUS
  Administered 2022-07-27: 3 [IU] via SUBCUTANEOUS
  Administered 2022-07-28: 4 [IU] via SUBCUTANEOUS
  Administered 2022-07-28 (×3): 3 [IU] via SUBCUTANEOUS
  Administered 2022-07-29 (×3): 4 [IU] via SUBCUTANEOUS
  Administered 2022-07-30 (×2): 3 [IU] via SUBCUTANEOUS
  Administered 2022-07-30: 4 [IU] via SUBCUTANEOUS
  Administered 2022-07-30: 7 [IU] via SUBCUTANEOUS
  Administered 2022-07-31 (×2): 4 [IU] via SUBCUTANEOUS
  Administered 2022-07-31: 3 [IU] via SUBCUTANEOUS
  Administered 2022-08-01: 7 [IU] via SUBCUTANEOUS
  Administered 2022-08-01 – 2022-08-02 (×3): 4 [IU] via SUBCUTANEOUS
  Administered 2022-08-02: 3 [IU] via SUBCUTANEOUS
  Administered 2022-08-02 – 2022-08-03 (×3): 4 [IU] via SUBCUTANEOUS
  Administered 2022-08-03: 7 [IU] via SUBCUTANEOUS
  Administered 2022-08-03: 4 [IU] via SUBCUTANEOUS
  Administered 2022-08-04: 3 [IU] via SUBCUTANEOUS
  Administered 2022-08-04: 4 [IU] via SUBCUTANEOUS
  Administered 2022-08-04: 3 [IU] via SUBCUTANEOUS
  Administered 2022-08-05: 4 [IU] via SUBCUTANEOUS
  Administered 2022-08-05: 3 [IU] via SUBCUTANEOUS
  Administered 2022-08-05 – 2022-08-06 (×3): 4 [IU] via SUBCUTANEOUS
  Administered 2022-08-07 (×3): 3 [IU] via SUBCUTANEOUS
  Administered 2022-08-08: 4 [IU] via SUBCUTANEOUS
  Administered 2022-08-08: 3 [IU] via SUBCUTANEOUS
  Administered 2022-08-09: 4 [IU] via SUBCUTANEOUS
  Administered 2022-08-09 – 2022-08-10 (×3): 3 [IU] via SUBCUTANEOUS
  Administered 2022-08-10: 4 [IU] via SUBCUTANEOUS
  Administered 2022-08-11 (×2): 3 [IU] via SUBCUTANEOUS
  Administered 2022-08-11 – 2022-08-12 (×2): 4 [IU] via SUBCUTANEOUS
  Administered 2022-08-12 – 2022-08-13 (×2): 3 [IU] via SUBCUTANEOUS
  Administered 2022-08-13 – 2022-08-17 (×11): 4 [IU] via SUBCUTANEOUS
  Administered 2022-08-17 (×2): 3 [IU] via SUBCUTANEOUS
  Administered 2022-08-18 – 2022-08-19 (×2): 4 [IU] via SUBCUTANEOUS
  Administered 2022-08-19 (×2): 3 [IU] via SUBCUTANEOUS
  Administered 2022-08-19 – 2022-08-20 (×4): 4 [IU] via SUBCUTANEOUS
  Administered 2022-08-21: 3 [IU] via SUBCUTANEOUS
  Administered 2022-08-21 – 2022-08-24 (×6): 4 [IU] via SUBCUTANEOUS
  Administered 2022-08-24: 3 [IU] via SUBCUTANEOUS
  Administered 2022-08-24: 4 [IU] via SUBCUTANEOUS
  Administered 2022-08-25 – 2022-08-26 (×4): 3 [IU] via SUBCUTANEOUS
  Administered 2022-08-26: 4 [IU] via SUBCUTANEOUS
  Administered 2022-08-26 – 2022-08-27 (×2): 3 [IU] via SUBCUTANEOUS
  Administered 2022-08-27: 4 [IU] via SUBCUTANEOUS
  Administered 2022-08-28: 3 [IU] via SUBCUTANEOUS
  Administered 2022-08-28: 4 [IU] via SUBCUTANEOUS
  Administered 2022-08-28: 3 [IU] via SUBCUTANEOUS
  Administered 2022-08-29 (×2): 4 [IU] via SUBCUTANEOUS
  Administered 2022-08-30 (×2): 3 [IU] via SUBCUTANEOUS
  Administered 2022-08-31 – 2022-09-01 (×3): 4 [IU] via SUBCUTANEOUS
  Administered 2022-09-02: 3 [IU] via SUBCUTANEOUS
  Administered 2022-09-02 – 2022-09-04 (×4): 4 [IU] via SUBCUTANEOUS
  Administered 2022-09-04 – 2022-09-06 (×7): 3 [IU] via SUBCUTANEOUS
  Administered 2022-09-06: 4 [IU] via SUBCUTANEOUS
  Administered 2022-09-07 (×2): 3 [IU] via SUBCUTANEOUS
  Administered 2022-09-08: 4 [IU] via SUBCUTANEOUS
  Administered 2022-09-08 (×3): 3 [IU] via SUBCUTANEOUS
  Administered 2022-09-09 – 2022-09-13 (×9): 4 [IU] via SUBCUTANEOUS
  Administered 2022-09-13: 7 [IU] via SUBCUTANEOUS
  Administered 2022-09-13 – 2022-09-14 (×3): 4 [IU] via SUBCUTANEOUS
  Administered 2022-09-14: 3 [IU] via SUBCUTANEOUS
  Administered 2022-09-14 – 2022-09-15 (×3): 4 [IU] via SUBCUTANEOUS
  Administered 2022-09-15 – 2022-09-17 (×3): 3 [IU] via SUBCUTANEOUS
  Administered 2022-09-18: 4 [IU] via SUBCUTANEOUS
  Administered 2022-09-18 – 2022-09-24 (×11): 3 [IU] via SUBCUTANEOUS
  Administered 2022-09-24: 4 [IU] via SUBCUTANEOUS
  Administered 2022-09-25 (×2): 3 [IU] via SUBCUTANEOUS
  Administered 2022-09-26: 4 [IU] via SUBCUTANEOUS
  Administered 2022-09-26 – 2022-09-27 (×2): 3 [IU] via SUBCUTANEOUS
  Administered 2022-09-28: 4 [IU] via SUBCUTANEOUS
  Administered 2022-09-28 – 2022-09-29 (×2): 3 [IU] via SUBCUTANEOUS
  Administered 2022-09-29: 4 [IU] via SUBCUTANEOUS
  Administered 2022-09-30: 3 [IU] via SUBCUTANEOUS
  Administered 2022-10-01 – 2022-10-03 (×4): 4 [IU] via SUBCUTANEOUS
  Administered 2022-10-03: 3 [IU] via SUBCUTANEOUS
  Administered 2022-10-03 – 2022-10-05 (×5): 4 [IU] via SUBCUTANEOUS
  Administered 2022-10-05 – 2022-10-07 (×4): 3 [IU] via SUBCUTANEOUS
  Administered 2022-10-07 – 2022-10-08 (×3): 4 [IU] via SUBCUTANEOUS
  Administered 2022-10-08: 7 [IU] via SUBCUTANEOUS
  Administered 2022-10-08: 3 [IU] via SUBCUTANEOUS
  Administered 2022-10-09: 4 [IU] via SUBCUTANEOUS
  Administered 2022-10-10 (×2): 3 [IU] via SUBCUTANEOUS
  Administered 2022-10-11 – 2022-10-12 (×2): 4 [IU] via SUBCUTANEOUS
  Administered 2022-10-12 – 2022-10-13 (×2): 3 [IU] via SUBCUTANEOUS
  Administered 2022-10-13 – 2022-10-15 (×4): 4 [IU] via SUBCUTANEOUS
  Administered 2022-10-16 (×2): 3 [IU] via SUBCUTANEOUS
  Administered 2022-10-17: 4 [IU] via SUBCUTANEOUS
  Administered 2022-10-17: 3 [IU] via SUBCUTANEOUS
  Administered 2022-10-17: 4 [IU] via SUBCUTANEOUS
  Administered 2022-10-18: 3 [IU] via SUBCUTANEOUS
  Administered 2022-10-18: 4 [IU] via SUBCUTANEOUS
  Administered 2022-10-19: 3 [IU] via SUBCUTANEOUS
  Administered 2022-10-19: 4 [IU] via SUBCUTANEOUS
  Administered 2022-10-19: 3 [IU] via SUBCUTANEOUS
  Administered 2022-10-20: 4 [IU] via SUBCUTANEOUS
  Administered 2022-10-21 (×3): 3 [IU] via SUBCUTANEOUS
  Administered 2022-10-22: 4 [IU] via SUBCUTANEOUS
  Administered 2022-10-22: 2 [IU] via SUBCUTANEOUS
  Administered 2022-10-23 (×2): 4 [IU] via SUBCUTANEOUS
  Administered 2022-10-23: 3 [IU] via SUBCUTANEOUS
  Administered 2022-10-24 (×2): 4 [IU] via SUBCUTANEOUS
  Administered 2022-10-24 – 2022-10-25 (×4): 3 [IU] via SUBCUTANEOUS
  Administered 2022-10-26 – 2022-10-27 (×3): 4 [IU] via SUBCUTANEOUS
  Administered 2022-10-27 – 2022-10-29 (×4): 3 [IU] via SUBCUTANEOUS
  Administered 2022-10-30: 4 [IU] via SUBCUTANEOUS
  Administered 2022-10-30 – 2022-10-31 (×2): 3 [IU] via SUBCUTANEOUS
  Administered 2022-10-31 – 2022-11-01 (×2): 4 [IU] via SUBCUTANEOUS
  Administered 2022-11-01: 3 [IU] via SUBCUTANEOUS
  Administered 2022-11-02: 4 [IU] via SUBCUTANEOUS
  Administered 2022-11-02: 3 [IU] via SUBCUTANEOUS
  Administered 2022-11-03 – 2022-11-05 (×4): 4 [IU] via SUBCUTANEOUS
  Administered 2022-11-05 – 2022-11-06 (×3): 3 [IU] via SUBCUTANEOUS
  Administered 2022-11-07 – 2022-11-08 (×2): 4 [IU] via SUBCUTANEOUS
  Administered 2022-11-08: 3 [IU] via SUBCUTANEOUS
  Administered 2022-11-08: 4 [IU] via SUBCUTANEOUS
  Administered 2022-11-09: 3 [IU] via SUBCUTANEOUS
  Administered 2022-11-09: 4 [IU] via SUBCUTANEOUS
  Administered 2022-11-09 (×2): 3 [IU] via SUBCUTANEOUS
  Administered 2022-11-10: 4 [IU] via SUBCUTANEOUS
  Administered 2022-11-10: 3 [IU] via SUBCUTANEOUS
  Administered 2022-11-10 – 2022-11-11 (×2): 4 [IU] via SUBCUTANEOUS
  Administered 2022-11-11 – 2022-11-12 (×3): 3 [IU] via SUBCUTANEOUS
  Administered 2022-11-12 – 2022-11-13 (×3): 4 [IU] via SUBCUTANEOUS
  Administered 2022-11-13 – 2022-11-15 (×4): 3 [IU] via SUBCUTANEOUS
  Administered 2022-11-15 – 2022-11-17 (×3): 4 [IU] via SUBCUTANEOUS
  Administered 2022-11-18 – 2022-11-19 (×3): 3 [IU] via SUBCUTANEOUS
  Administered 2022-11-20: 4 [IU] via SUBCUTANEOUS
  Filled 2022-07-21 (×292): qty 1

## 2022-07-21 NOTE — TOC Progression Note (Signed)
Transition of Care [facility]) - Progression Note    Patient Details  Name: Debra [name] MRN: [id number] Date of Birth: 31-Oct-1965  Transition of [facility]) CM/SW [place], LCSW Phone Number: 07/21/2022, 8:23 AM  Clinical Narrative:   TOC continues to follow for disposition needs.  Expected Discharge Plan: [place] Barriers to Discharge: Continued Medical Work up  Expected Discharge Plan and Services Expected Discharge Plan: [place]                                               Social Determinants of Health (SDOH) Interventions    Readmission Risk Interventions     No data to display

## 2022-07-21 NOTE — Progress Notes (Signed)
PHARMACY - TOTAL PARENTERAL NUTRITION CONSULT NOTE   Indication: Prolonged ileus  Patient Measurements: Height: _0  (149.9 cm) Weight: 94.3 kg (207 lb 14.3 oz) IBW/kg (Calculated) : 43.2 TPN AdjBW (KG): 55.2 Body mass index is 41.99 kg/m.  Assessment: Debra [name] is a 56 y.o. female s/p laparotomy, excision of greater omental mass, abdominal wall reconstruction with [name], appendectomy, and placement of Prevena vac.  Glucose / Insulin: No apparent history of diabetes.  BG 140-256>with SSI 4x/day + 10u insulin in TPN. 11 units SSI/24 hrs. (Insulin usage trending up)  Electrolytes: chloride 112; all other electrolytes WNL Renal: Scr 0.5 -->1.33 > 1.26 > 1> 1.18 Hepatic: No transaminitis. ALK Phos elevated at 172. TG within normal limits GI Imaging: 9/11 CTAP: no new acute issues GI Surgeries / Procedures: s/p laparotomy, excision of greater omental mass, abdominal wall reconstruction with [name], appendectomy, and placement of Prevena vac  Central access: 02/15/22 TPN start date: 02/15/22  Nutritional Goals: Goal cyclic TPN over 16 hrs: cyclic TPN over 16 hrs: (provides 102 g of protein and 1817 kcals per day) total volume over 16hrs for calculations=1400 ml(1500 ml total with overfill)  RD Assessment:  Estimated Needs Total Energy Estimated Needs: 1800-2100kcal/day Total Protein Estimated Needs: 90-110g/d Total Fluid Estimated Needs: 1.4-1.6L/day  Current Nutrition:  Full Liquid diet + nutritional supplements, not meeting PO needs yet  Patient walks around in afternoon/evening On loperamide q6h scheduled started 7/18  Plan: Transitioned to *Cyclic* TPN on 0/35.   to run over 16 hours per MD request. [name] at 2000 per discussion with Dietician to allow patient time for walking in afternoon/evenings.  To run over 16 hours: -Start rate at 49 mL/hr for 1 hour. -Increase rate to 98 mL/hr for 13 hours.  -Decrease rate to 49 mL/hr for 1  hour. -Decrease rate to 25 mL/hr for 1 hours, then stop.  Continue Nutritional Components Amino acids (using 15% Clinisol): 102 g Dextrose 19% = 266 g Lipids (using 20% SMOFlipids): 50.4 g kCal: 1817/24h  Electrolytes in TPN (standard): Na 17mq/L, K 548m/L, Ca 52m19mL, Mg 10 mEq/L, Phos 10 mmol/L. Cl:Ac 1:2 Continue with  MVI, trace elements, and chromium in TPN  Continue CBG/SSI for Cyclic TPN: SSI  -CBG 2 hrs after cyclic TPN start -CBG during middle of cyclic TPN infusion -CBG 1 hr after cylic TPN stopped -CBG while off TPN (=4 CBGs per 24 hr period) SSI moderate> resistant + 10u insulin in TPN Check TPN labs on Mon/Thurs at minimum Monitor for further need of diuresis and adjust as needed for fluid status Continues Lasix 20 mg PO BID added 10/13 (Net neg 8.2L)  CarDorothe PeaharmD, BCPS 07/21/2022 7:48 AM

## 2022-07-21 NOTE — Progress Notes (Signed)
End of shift note:  Pt denied s/s of nausea during this day. Diet was advanced to full liquid. VSS. BG monitored and coverage given. TPN was started.

## 2022-07-21 NOTE — Progress Notes (Signed)
[facility] SURGICAL PROGRESS NOTE (cpt [phone])  Hospital Day(s): 158.   Post op day(s): 156 Days Post-Op.   Interval History:  Patient seen and examined Pain over the weekend improved No longer with nausea/emesis [name] intact; Managing effluent with intermittent suction; output 1000 ccs [name] last changed on 11/06 Nutritional labs are reassuring, slight bump in sCr to 1.18 Renal function normal; sCr - 1.00 She is on soft diet; on cyclic TPN More difficulty ambulating secondary to back pain; PT following again; recommending HHPT Appreciate neurosurgery recommendations  Review of Systems:  Constitutional: denies fever, chills  HEENT: denies cough or congestion  Respiratory: denies any shortness of breath  Cardiovascular: denies chest pain or palpitations  Gastrointestinal: denies abdominal pain, N/V Genitourinary: denies burning with urination or urinary frequency Integumentary: + midline wound MSK: + Back Pain   Vital signs in last 24 hours: [min-max] current  Temp:  [98.1 F (36.7 C)-98.5 F (36.9 C)] 98.5 F (36.9 C) (11/13 0805) Pulse Rate:  [86-92] 86 (11/13 0805) Resp:  [16-20] 16 (11/13 0805) BP: (115-126)/(75-79) 126/76 (11/13 0805) SpO2:  [95 %-100 %] 100 % (11/13 0805) Weight:  [94.3 kg] 94.3 kg (11/13 0500)     Height: '4\' 11"'$  (149.9 cm) Weight: 94.3 kg BMI (Calculated): 43.04   Intake/Output last 2 shifts:  11/12 0701 - 11/13 0700 In: 588.5 [P.O.:120; I.V.:468.5] Out: 10 [Urine:2800; Drains:1000; Stool:60]   Physical Exam:  Constitutional: alert, cooperative and no distress  Respiratory: breathing non-labored at rest  Cardiovascular: regular rate and sinus rhythm  Gastrointestinal: Soft, abdominal soreness on the right, non-distended, no rebound/guarding. Integumentary: Midline wound open, peritoneum closed; granulating; there are three stomatized areas visible in the LUQ and LLQ portions of the wound, output remains feculent. There is  hypergranulation tissue to the inferior wound edge.   Labs:     Latest Ref Rng & Units 07/13/2022    7:05 AM 07/02/2022   10:13 AM 06/17/2022    5:03 AM  CBC  WBC 4.0 - 10.5 K/uL 4.5  3.7  2.7   Hemoglobin 12.0 - 15.0 g/dL 9.9  10.2  8.1   Hematocrit 36.0 - 46.0 % 31.1  32.6  25.6   Platelets 150 - 400 K/uL 115  84  69       Latest Ref Rng & Units 07/21/2022    6:50 AM 07/20/2022   12:23 PM 07/19/2022    5:50 PM  CMP  Glucose 70 - 99 mg/dL 256   155   BUN 6 - 20 mg/dL 56   47   Creatinine 0.44 - 1.00 mg/dL 1.18   0.98   Sodium 135 - 145 mmol/L 139   141   Potassium 3.5 - 5.1 mmol/L 4.0  4.1  4.9   Chloride 98 - 111 mmol/L 112   111   CO2 22 - 32 mmol/L 20   20   Calcium 8.9 - 10.3 mg/dL 9.1   9.2   Total Protein 6.5 - 8.1 g/dL 7.3     Total Bilirubin 0.3 - 1.2 mg/dL 0.6     Alkaline Phos 38 - 126 U/L 172     AST 15 - 41 U/L 30     ALT 0 - 44 U/L 29       Imaging studies: No new pertinent imaging studies  Assessment/Plan:  56 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   - New 11/10: Restart FLD; advance as tolerate. Back pain better after lidocaine patches. No issues with [name] pouch   - Wound Care: Last change 11/06; pictures added. [name] pouch with "window" but not on intermittent or continuous suction. Patient seems to like this better. She will need intermittent suction with Y[place]auer to control effluent. Likely be beneficial to suction before ambulation.   - Continue TPN; cyclic - Appreciate dietary assistance with trace elements; iron supplementation            - Monitor abdominal examination; on-going bowel function            - Pain control prn; antemetic prn            - Therapies back on board;  recommending HHPT; continue to follow    - Discharge Planning: Anticipate lengthy admission and potential eventual transfer; no new progress regarding disposition    All of the above findings and recommendations were discussed with the patient, and the medical team, and all of patient's questions were answered to her expressed satisfaction.  -- [name], PA-C  [facility] 07/21/2022, 8:41 AM M-F: 7am - 4pm

## 2022-07-22 LAB — GLUCOSE, CAPILLARY
Glucose-Capillary: 262 mg/dL — ABNORMAL HIGH (ref 70–99)
Glucose-Capillary: 111 mg/dL — ABNORMAL HIGH (ref 70–99)
Glucose-Capillary: 149 mg/dL — ABNORMAL HIGH (ref 70–99)
Glucose-Capillary: 164 mg/dL — ABNORMAL HIGH (ref 70–99)

## 2022-07-22 MED ORDER — LIDOCAINE 5 % EX PTCH
2.0000 | MEDICATED_PATCH | CUTANEOUS | Status: DC
  Administered 2022-07-22 – 2022-09-28 (×57): 2 via TRANSDERMAL
  Filled 2022-07-22 (×63): qty 2

## 2022-07-22 MED ORDER — ZINC CHLORIDE 1 MG/ML IV SOLN
INTRAVENOUS | Status: AC
  Filled 2022-07-22: qty 681.33

## 2022-07-22 MED ORDER — ALBUMIN HUMAN 25 % IV SOLN
12.5000 g | Freq: Once | INTRAVENOUS | Status: AC
  Administered 2022-07-22: 12.5 g via INTRAVENOUS
  Filled 2022-07-22: qty 50

## 2022-07-22 MED ORDER — PREGABALIN 50 MG PO CAPS
50.0000 mg | ORAL_CAPSULE | Freq: Three times a day (TID) | ORAL | Status: DC
  Administered 2022-07-22 – 2022-08-11 (×56): 50 mg via ORAL
  Filled 2022-07-22 (×57): qty 1

## 2022-07-22 MED ORDER — ACETAMINOPHEN 500 MG PO TABS
1000.0000 mg | ORAL_TABLET | Freq: Four times a day (QID) | ORAL | Status: DC
  Administered 2022-07-22 – 2022-11-03 (×304): 1000 mg via ORAL
  Filled 2022-07-22 (×357): qty 2

## 2022-07-22 MED ORDER — TRACE MINERALS CU-MN-SE-ZN 300-55-60-3000 MCG/ML IV SOLN
INTRAVENOUS | Status: DC
  Filled 2022-07-22: qty 681.33

## 2022-07-22 NOTE — Progress Notes (Signed)
PHARMACY - TOTAL PARENTERAL NUTRITION CONSULT NOTE   Indication: Prolonged ileus  Patient Measurements: Height: _0  (149.9 cm) Weight: 94.5 kg (208 lb 5.4 oz) IBW/kg (Calculated) : 43.2 TPN AdjBW (KG): 55.2 Body mass index is 42.08 kg/m.  Assessment: Debra [name] is a 56 y.o. female s/p laparotomy, excision of greater omental mass, abdominal wall reconstruction with [name], appendectomy, and placement of Prevena vac.  Glucose / Insulin: No apparent history of diabetes.  BG 111-265>with SSI 4x/day + 10u insulin in TPN. 24 units SSI/24 hrs. (Insulin usage trending up)  Electrolytes: chloride 112; all other electrolytes WNL Renal: Scr 0.5 -->1.33 > 1.26 > 1> 1.18 Hepatic: No transaminitis. ALK Phos elevated at 172. TG within normal limits GI Imaging: 9/11 CTAP: no new acute issues GI Surgeries / Procedures: s/p laparotomy, excision of greater omental mass, abdominal wall reconstruction with [name], appendectomy, and placement of Prevena vac  Central access: 02/15/22 TPN start date: 02/15/22  Nutritional Goals: Goal cyclic TPN over 16 hrs: cyclic TPN over 16 hrs: (provides 102 g of protein and 1817 kcals per day) total volume over 16hrs for calculations=1400 ml(1500 ml total with overfill)  RD Assessment:  Estimated Needs Total Energy Estimated Needs: 1800-2100kcal/day Total Protein Estimated Needs: 90-110g/d Total Fluid Estimated Needs: 1.4-1.6L/day  Current Nutrition:  Full Liquid diet + nutritional supplements, not meeting PO needs yet  Patient walks around in afternoon/evening On loperamide q6h scheduled started 7/18  Plan: Transitioned to *Cyclic* TPN on 3/53.   to run over 16 hours per MD request. [name] at 2000 per discussion with Dietician to allow patient time for walking in afternoon/evenings.  To run over 16 hours: -Start rate at 49 mL/hr for 1 hour. -Increase rate to 98 mL/hr for 13 hours.  -Decrease rate to 49 mL/hr for 1  hour. -Decrease rate to 25 mL/hr for 1 hours, then stop.  07/22/22 Continue Nutritional Components Amino acids (using 15% Clinisol): 102 g Dextrose 19% = 266 g Lipids (using 20% SMOFlipids): 50.4 g kCal: 1817/24h  Electrolytes in TPN (standard): Na 41mq/L, K 578m/L, Ca 67m68mL, Mg 10 mEq/L, Phos 10 mmol/L. Cl:Ac 1:2 Continue with  MVI, trace elements, and chromium in TPN  Add Zinc 5 gram to TPN Continue CBG/SSI for Cyclic TPN: SSI  -CBG 2 hrs after cyclic TPN start -CBG during middle of cyclic TPN infusion -CBG 1 hr after cylic TPN stopped -CBG while off TPN Continue (=4 CBGs per 24 hr period) SSI moderate> resistant + 10u insulin in TPN Check TPN labs on Mon/Thurs at minimum Monitor for further need of diuresis and adjust as needed for fluid status Continues Lasix 20 mg PO BID added 10/13 (Net neg 8.2L)  CarDorothe PeaharmD, BCPS 07/22/2022 7:51 AM

## 2022-07-22 NOTE — Progress Notes (Signed)
Seen and examined.  She continues to have back pain. [name] pouch containing the fistula contents. Plan continue TPN and diet Scheduled Tylenol Added scheduled Lyrica

## 2022-07-22 NOTE — Progress Notes (Signed)
Occupational Therapy Treatment Patient Details Name: Debra [name] MRN: [id number] DOB: 1966-09-05 Today's Date: 07/22/2022   History of present illness Pt is a 56 y.o. female with PMH including hypothyroidism, iron deficient anemia, colon cancer.  Here s/p mass removal with hernia repair and appendectomy 6/8, bowel perforation repair 6/10. [name] pouch was initially placed 6/20 and changed on 7/10. Re-evaluation consult placed as pt with acute back pain and muscle spasms, not able to get OOB in 3 days. Pt is POD 148.   OT comments  Patient in bed upon arrival and agreeable to OT treatment. Interpreter utilized during session. Patient was able to roll into sidelying with mod A, transferred to EOB with mod A. Sit>stand with supervision/ min guard. Patient was able to ambulate ~45f in room to bathroom with supervision and transfer to toilet with min guard/supervision. Patient with c/o back pain initially, but reports "it gets better once she's up and moving."Patient placed in recliner with supervision. Patient left in recliner with call bell in reach, lunch tray set up, chair alarm set, and all needs met.        Recommendations for follow up therapy are one component of a multi-disciplinary discharge planning process, led by the attending physician.  Recommendations may be updated based on patient status, additional functional criteria and insurance authorization.    Follow Up Recommendations  Home health OT     Assistance Recommended at Discharge Frequent or constant Supervision/Assistance  Patient can return home with the following  Assistance with cooking/housework;Assist for transportation;A little help with walking and/or transfers;A little help with bathing/dressing/bathroom   Equipment Recommendations  None recommended by OT       Precautions / Restrictions Precautions Precautions: Fall Precaution Comments: PICC line R UE, [name] pouch abdomen Restrictions Weight Bearing  Restrictions: No       Mobility Bed Mobility Overal bed mobility: Needs Assistance Bed Mobility: Rolling, Sidelying to Sit Rolling: Mod assist Sidelying to sit: Mod assist            Transfers Overall transfer level: Needs assistance Equipment used: Rolling walker (2 wheels) Transfers: Sit to/from Stand Sit to Stand: Supervision, Min guard                 Balance Overall balance assessment: Needs assistance Sitting-balance support: Feet supported, No upper extremity supported Sitting balance-Leahy Scale: Fair Sitting balance - Comments: Able to maintain static sitting with supervision   Standing balance support: Bilateral upper extremity supported, During functional activity, Reliant on assistive device for balance Standing balance-Leahy Scale: Fair                             ADL either performed or assessed with clinical judgement   ADL                       Lower Body Dressing: Maximal assistance   Toilet Transfer: Min guard;Supervision/safety                  Extremity/Trunk Assessment Upper Extremity Assessment Upper Extremity Assessment: Generalized weakness   Lower Extremity Assessment Lower Extremity Assessment: Generalized weakness        Vision Patient Visual Report: No change from baseline            Cognition Arousal/Alertness: Awake/alert Behavior During Therapy: WFL for tasks assessed/performed Overall Cognitive Status: No family/caregiver present to determine baseline cognitive functioning Area of Impairment: Following commands, Safety/judgement  Following Commands: Follows one step commands inconsistently Safety/Judgement: Decreased awareness of safety, Decreased awareness of deficits                         Pertinent Vitals/ Pain       Pain Assessment Pain Assessment: Faces Faces Pain Scale: Hurts a little bit Pain Location: back with movement due to back  spasms Pain Descriptors / Indicators: Grimacing, Discomfort Pain Intervention(s): Limited activity within patient's tolerance, Monitored during session, Repositioned   Frequency  Min 2X/week        Progress Toward Goals  OT Goals(current goals can now be found in the care plan section)     Acute Rehab OT Goals Patient Stated Goal: get better OT Goal Formulation: With patient Time For Goal Achievement: 07/28/22 Potential to Achieve Goals: Fair   AM-PAC OT "6 Clicks" Daily Activity     Outcome Measure   Help from another person eating meals?: None Help from another person taking care of personal grooming?: A Little Help from another person toileting, which includes using toliet, bedpan, or urinal?: A Little Help from another person bathing (including washing, rinsing, drying)?: A Lot Help from another person to put on and taking off regular upper body clothing?: A Little Help from another person to put on and taking off regular lower body clothing?: A Lot 6 Click Score: 17    End of Session Equipment Utilized During Treatment: Other (comment) (Interpreter)  OT Visit Diagnosis: Unsteadiness on feet (R26.81);Pain   Activity Tolerance Patient tolerated treatment well   Patient Left in chair;with call bell/phone within reach;with chair alarm set   Nurse Communication Mobility status        Time: 1335-1403 OT Time Calculation (min): 28 min  Charges: OT General Charges $OT Visit: 1 Visit OT Treatments $Self Care/Home Management : 23-37 mins    Finneas Mathe, OTS 07/22/2022, 2:51 PM

## 2022-07-22 NOTE — Progress Notes (Signed)
End of shift note:  Pt was started on schedule lyrica and tylenol. Pt states she feels better today. Denied s/s of nausea. [name] pouch and right chest port were changed today. IV albumin given.

## 2022-07-22 NOTE — Progress Notes (Signed)
Nutrition Follow-up  DOCUMENTATION CODES:   Obesity unspecified  INTERVENTION:   Continue cyclic TPN per pharmacy (16 hrs)- provides 1817kcal/day and 102g/day protein   Ensure Enlive po TID, each supplement provides 350 kcal and 20 grams of protein.  MVI, chromium and trace elements in TPN   Daily weights   Vitamin D2-  50,000 units po weekly   Zing 30m daily added to TPN   NUTRITION DIAGNOSIS:   Increased nutrient needs related to wound healing, catabolic illness as evidenced by estimated needs.  GOAL:   Patient will meet greater than or equal to 90% of their needs -met with TPN   MONITOR:   PO intake, Supplement acceptance, Labs, Weight trends, Diet advancement, I & O's, TPN  ASSESSMENT:   56y/o female with h/o hypothyroidism, COVID 19 (3/21), kidney stones and stage 3 colon cancer (s/p left hemicolectomy 5/21 and chemotherapy) who is admitted for new pelvic mass now s/p laparotomy 6/8 (with excision of pelvic mass from greater omentum, abdominal wall reconstruction with bilateral myocutaneous flaps and mesh, incisional hernia repair, appendectomy repair and VAC placement) complicated by bowel perforation s/p reopening of recent laparotomy 6/10 (with repair of small bowel perforation, excision of mesh, placement of two phasix mesh and VAC placement). Pathology returned as metastatic adenocarcinoma. Pt with new L1 compression fracture.   Pt continues to tolerate TPN well at goal rate; TPN is currently being cycled for 16 hours. Triglycerides wnl and are being checked monthly. Hyperglycemia improved with insulin in TPN. Volume status improved with diuresis; pt remains up ~11lbs from admission. Will recheck vitamin labs 11/30 and then every other month once normal. Will check manganese levels monthly. Pt continues to have poor oral intake. Pt having some emesis 11/11. Pt is drinking some Ensure. [name] pouch remains in place with 10018moutput.   Medications reviewed and  include: D2, lasix, heparin, insulin, L-glutamine, synthroid, imodium, protonix, carafate  -Selenium 105 wnl, Manganese 25.6(H), copper 121, Zinc 69 - 11/2 -Chromium 1.4 wnl, Iodine 57.9 wnl- 10/2 -Iron 31.0 wnl, TIBC 360, ferritin 33 wnl, transferrin 272, folate 10.5 wnl, B12 291- 10/16 -Vitamin B1- 117.2, vitamin D 19.46(L), vitamin A 14.3(L), B6 11.9 wnl, vitamin C 0.8, vitamin E 13.4 wnl, vitamin K 0.64 wnl- 10/16  Labs reviewed: Na 139 wnl, K 4.0 wnl, BUN 56(H), creat 1.18(H), P 4.2 wnl, Mg 2.2 wnl- 11/13 Triglycerides- 98-10/16 Cbgs- 111, 262 x 24 hrs  Diet Order:    Diet Order             Diet full liquid Room service appropriate? Yes; Fluid consistency: Thin  Diet effective now                  EDUCATION NEEDS:   Not appropriate for education at this time  Skin:  Skin Assessment: Reviewed RN Assessment ( Midline wound: 11cm x 12cm )  Last BM:  11/13  Height:   Ht Readings from Last 1 Encounters:  03/01/22 _0  (1.499 m)    Weight:   Wt Readings from Last 1 Encounters:  07/22/22 94.5 kg    Ideal Body Weight:  44.3 kg  BMI:  Body mass index is 42.08 kg/m.  Estimated Nutritional Needs:   Kcal:  1800-2100kcal/day  Protein:  90-110g/d  Fluid:  1.4-1.6L/day  CaKoleen DistanceS, RD, LDN Please refer to [facility] and/or RD on-call/weekend/after hours pager

## 2022-07-23 LAB — GLUCOSE, CAPILLARY
Glucose-Capillary: 227 mg/dL — ABNORMAL HIGH (ref 70–99)
Glucose-Capillary: 287 mg/dL — ABNORMAL HIGH (ref 70–99)
Glucose-Capillary: 144 mg/dL — ABNORMAL HIGH (ref 70–99)
Glucose-Capillary: 128 mg/dL — ABNORMAL HIGH (ref 70–99)
Glucose-Capillary: 231 mg/dL — ABNORMAL HIGH (ref 70–99)

## 2022-07-23 LAB — BASIC METABOLIC PANEL
Anion gap: 9 (ref 5–15)
BUN: 78 mg/dL — ABNORMAL HIGH (ref 6–20)
CO2: 21 mmol/L — ABNORMAL LOW (ref 22–32)
Calcium: 9.5 mg/dL (ref 8.9–10.3)
Chloride: 109 mmol/L (ref 98–111)
Creatinine, Ser: 1.25 mg/dL — ABNORMAL HIGH (ref 0.44–1.00)
GFR, Estimated: 50 mL/min — ABNORMAL LOW (ref >60)
Glucose, Bld: 276 mg/dL — ABNORMAL HIGH (ref 70–99)
Potassium: 4.1 mmol/L (ref 3.5–5.1)
Sodium: 139 mmol/L (ref 135–145)

## 2022-07-23 LAB — TRIGLYCERIDES: Triglycerides: 84 mg/dL (ref <150)

## 2022-07-23 MED ORDER — ZINC CHLORIDE 1 MG/ML IV SOLN
INTRAVENOUS | Status: AC
  Filled 2022-07-23: qty 681.33

## 2022-07-23 NOTE — Progress Notes (Signed)
Physical Therapy Treatment Patient Details Name: Debra [name] MRN: [id number] DOB: [birth year] Today's Date: 07/23/2022   History of Present Illness Pt is a 56 y.o. female with PMH including hypothyroidism, iron deficient anemia, colon cancer.  Here s/p mass removal with hernia repair and appendectomy 6/8, bowel perforation repair 6/10. [name] pouch was initially placed 6/20 and changed on 7/10. Re-evaluation consult placed as pt with acute back pain and muscle spasms, not able to get OOB in 3 days. Pt is POD 148.    PT Comments    Patient received in bed, interpreter via [place] used, [place] [phone]. Patient is agreeable to PT. She is mod independent with bed mobility, transfers with supervision. Ambulated 160 feet with RW and supervision. No lob, slow pace. Patient improving. She will continue to benefit from skilled PT while here to improve strength, safety and functional independence.      Recommendations for follow up therapy are one component of a multi-disciplinary discharge planning process, led by the attending physician.  Recommendations may be updated based on patient status, additional functional criteria and insurance authorization.  Follow Up Recommendations  Home health PT Can patient physically be transported by private vehicle: Yes   Assistance Recommended at Discharge Intermittent Supervision/Assistance  Patient can return home with the following A little help with walking and/or transfers;A lot of help with bathing/dressing/bathroom   Equipment Recommendations  Rolling walker (2 wheels)    Recommendations for Other Services       Precautions / Restrictions Precautions Precaution Comments: PICC line R UE, [name] pouch abdomen, mod fall Restrictions Weight Bearing Restrictions: No Other Position/Activity Restrictions: Abdominal surgery/[name] pouch     Mobility  Bed Mobility Overal bed mobility: Needs Assistance Bed Mobility: Supine to Sit   Sidelying to sit:  Supervision Supine to sit: Supervision     General bed mobility comments: no physical assist, supervision, use of bed rails    Transfers Overall transfer level: Needs assistance Equipment used: Rolling walker (2 wheels) Transfers: Sit to/from Stand Sit to Stand: Supervision                Ambulation/Gait Ambulation/Gait assistance: Supervision Gait Distance (Feet): 160 Feet Assistive device: Rolling walker (2 wheels) Gait Pattern/deviations: Step-through pattern, Decreased step length - right, Decreased step length - left, Decreased stride length Gait velocity: decreased     General Gait Details: supervision with mobility. No hands on assist. No LOB.   Stairs             Wheelchair Mobility    Modified Rankin (Stroke Patients Only)       Balance Overall balance assessment: Modified Independent Sitting-balance support: Feet supported Sitting balance-Leahy Scale: Good     Standing balance support: Bilateral upper extremity supported, During functional activity, Reliant on assistive device for balance Standing balance-Leahy Scale: Good                              Cognition Arousal/Alertness: Awake/alert Behavior During Therapy: WFL for tasks assessed/performed Overall Cognitive Status: Within Functional Limits for tasks assessed                         Following Commands: Follows one step commands consistently                Exercises      General Comments        Pertinent Vitals/Pain Pain Assessment Pain Assessment: Faces  Faces Pain Scale: Hurts a little bit Facial Expression: Relaxed, neutral Body Movements: Absence of movements Muscle Tension: Relaxed Vocalization (extubated pts.): Talking in normal tone or no sound Pain Location: back, abdomen Pain Descriptors / Indicators: Grimacing, Discomfort Pain Intervention(s): Monitored during session    Home Living                          Prior  Function            PT Goals (current goals can now be found in the care plan section) Acute Rehab PT Goals Patient Stated Goal: to go home PT Goal Formulation: With patient Time For Goal Achievement: 08/06/22 Potential to Achieve Goals: Good Progress towards PT goals: Progressing toward goals    Frequency    Min 2X/week      PT Plan Current plan remains appropriate    Co-evaluation              AM-PAC PT "6 Clicks" Mobility   Outcome Measure  Help needed turning from your back to your side while in a flat bed without using bedrails?: A Little Help needed moving from lying on your back to sitting on the side of a flat bed without using bedrails?: A Little Help needed moving to and from a bed to a chair (including a wheelchair)?: A Little Help needed standing up from a chair using your arms (e.g., wheelchair or bedside chair)?: A Little Help needed to walk in hospital room?: A Little Help needed climbing 3-5 steps with a railing? : A Lot 6 Click Score: 17    End of Session   Activity Tolerance: Patient tolerated treatment well Patient left: in chair;with call bell/phone within reach;with chair alarm set Nurse Communication: Mobility status;Other (comment) (called NT that patient needs purewick replaced and canister replaced) PT Visit Diagnosis: Muscle weakness (generalized) (M62.81);Pain;Difficulty in walking, not elsewhere classified (R26.2) Pain - Right/Left:  (abd/back)     Time: 3664-4034 PT Time Calculation (min) (ACUTE ONLY): 27 min  Charges:  $Gait Training: 23-37 mins                     [employer], PT, GCS 07/23/22,3:39 PM

## 2022-07-23 NOTE — Progress Notes (Addendum)
[facility] SURGICAL PROGRESS NOTE (cpt [id number])  Hospital Day(s): 160.   Post op day(s): 158 Days Post-Op.   Interval History:  Patient seen and examined Did have episode of emesis this AM [name] intact; Managing effluent with intermittent suction; output 1000 ccs [name] last changed on 11/14 She is on soft diet; on cyclic TPN More difficulty ambulating secondary to back pain; PT following again; recommending HHPT Appreciate neurosurgery recommendations  Review of Systems:  Constitutional: denies fever, chills  HEENT: denies cough or congestion  Respiratory: denies any shortness of breath  Cardiovascular: denies chest pain or palpitations  Gastrointestinal: denies abdominal pain, N/V Genitourinary: denies burning with urination or urinary frequency Integumentary: + midline wound MSK: + Back Pain   Vital signs in last 24 hours: [min-max] current  Temp:  [97.5 F (36.4 C)-98.4 F (36.9 C)] 98.2 F (36.8 C) (11/15 0627) Pulse Rate:  [77-95] 95 (11/15 0627) Resp:  [18-20] 20 (11/15 0627) BP: (96-117)/(55-73) 116/66 (11/15 0627) SpO2:  [94 %-100 %] 98 % (11/15 0627) Weight:  [94.7 kg] 94.7 kg (11/15 0500)     Height: '4\' 11"'$  (149.9 cm) Weight: 94.7 kg BMI (Calculated): 43.04   Intake/Output last 2 shifts:  11/14 0701 - 11/15 0700 In: 1146.7 [I.V.:1146.7] Out: 1650 [Urine:850; Drains:800]   Physical Exam:  Constitutional: alert, cooperative and no distress  Respiratory: breathing non-labored at rest  Cardiovascular: regular rate and sinus rhythm  Gastrointestinal: Soft, abdominal soreness on the right, non-distended, no rebound/guarding. Integumentary: Midline wound open, peritoneum closed; granulating; there are three stomatized areas visible in the LUQ and LLQ portions of the wound, output remains feculent. There is hypergranulation tissue to the inferior wound edge.   Labs:     Latest Ref Rng & Units 07/13/2022    7:05 AM 07/02/2022   10:13 AM 06/17/2022     5:03 AM  CBC  WBC 4.0 - 10.5 K/uL 4.5  3.7  2.7   Hemoglobin 12.0 - 15.0 g/dL 9.9  10.2  8.1   Hematocrit 36.0 - 46.0 % 31.1  32.6  25.6   Platelets 150 - 400 K/uL 115  84  69       Latest Ref Rng & Units 07/23/2022    5:09 AM 07/21/2022    6:50 AM 07/20/2022   12:23 PM  CMP  Glucose 70 - 99 mg/dL 276  256    BUN 6 - 20 mg/dL 78  56    Creatinine 0.44 - 1.00 mg/dL 1.25  1.18    Sodium 135 - 145 mmol/L 139  139    Potassium 3.5 - 5.1 mmol/L 4.1  4.0  4.1   Chloride 98 - 111 mmol/L 109  112    CO2 22 - 32 mmol/L 21  20    Calcium 8.9 - 10.3 mg/dL 9.5  9.1    Total Protein 6.5 - 8.1 g/dL  7.3    Total Bilirubin 0.3 - 1.2 mg/dL  0.6    Alkaline Phos 38 - 126 U/L  172    AST 15 - 41 U/L  30    ALT 0 - 44 U/L  29      Imaging studies: No new pertinent imaging studies  Assessment/Plan:  56 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   -  New 11/15: Okay to advance diet as tolerates, No issues with [name] pouch   - Wound Care: Last change 11/14. [name] pouch with "window" but not on intermittent or continuous suction. Patient seems to like this better. She will need intermittent suction with Yankauer to control effluent. Likely be beneficial to suction before ambulation.   - Continue TPN; cyclic - Appreciate dietary assistance with trace elements; iron supplementation            - Monitor abdominal examination; on-going bowel function            - Pain control prn; antemetic prn            - Therapies back on board; recommending HHPT; continue to follow    - Discharge Planning: Anticipate lengthy admission and potential eventual transfer; no new progress regarding disposition    All of the above findings and  recommendations were discussed with the patient, and the medical team, and all of patient's questions were answered to her expressed satisfaction.  -- [name], PA-C [facility] 07/23/2022, 7:50 AM M-F: 7am - 4pm

## 2022-07-23 NOTE — Progress Notes (Signed)
PHARMACY - TOTAL PARENTERAL NUTRITION CONSULT NOTE   Indication: Prolonged ileus  Patient Measurements: Height: 4' 11" (149.9 cm) Weight: 94.7 kg (208 lb 12.4 oz) IBW/kg (Calculated) : 43.2 TPN AdjBW (KG): 55.2 Body mass index is 42.17 kg/m.  Assessment: Debra [name] is a 56 y.o. female s/p laparotomy, excision of greater omental mass, abdominal wall reconstruction with [name], appendectomy, and placement of Prevena vac.  Glucose / Insulin: No apparent history of diabetes.  BG 164-287 >with SSI 4x/day + 10 > 15u insulin in TPN. 13 units SSI/24 hrs. (Insulin usage trending up)  Electrolytes: chloride 112; all other electrolytes WNL Renal: Scr 0.5 -->1.33 > 1.26 > 1> 1.18 > 1.25 Hepatic: No transaminitis. ALK Phos elevated at 172. TG within normal limits GI Imaging: 9/11 CTAP: no new acute issues GI Surgeries / Procedures: s/p laparotomy, excision of greater omental mass, abdominal wall reconstruction with [name], appendectomy, and placement of Prevena vac  Central access: 02/15/22 TPN start date: 02/15/22  Nutritional Goals: Goal cyclic TPN over 16 hrs: cyclic TPN over 16 hrs: (provides 102 g of protein and 1817 kcals per day) total volume over 16hrs for calculations=1400 ml(1500 ml total with overfill)  RD Assessment:  Estimated Needs Total Energy Estimated Needs: 1800-2100kcal/day Total Protein Estimated Needs: 90-110g/d Total Fluid Estimated Needs: 1.4-1.6L/day  Current Nutrition:  Full Liquid diet + nutritional supplements, not meeting PO needs yet  Patient walks around in afternoon/evening On loperamide q6h scheduled started 7/18  Plan: Transitioned to *Cyclic* TPN on 2/37.   to run over 16 hours per MD request. [name] at 2000 per discussion with Dietician to allow patient time for walking in afternoon/evenings.  To run over 16 hours: -Start rate at 49 mL/hr for 1 hour. -Increase rate to 98 mL/hr for 13 hours.  -Decrease rate to 49  mL/hr for 1 hour. -Decrease rate to 25 mL/hr for 1 hours, then stop.  Plan 07/23/22 Continue Nutritional Components Amino acids (using 15% Clinisol): 102 g Dextrose 19% = 266 g Lipids (using 20% SMOFlipids): 50.4 g kCal: 1817/24h  Electrolytes in TPN (standard): Na 68mq/L, K 556m/L, Ca 61m60mL, Mg 10 mEq/L, Phos 10 mmol/L.  Continue Cl:Ac 1:2 Discontinued furosemide 20 mg BID after discussion with surgery due to increasing Scr and BUN; Net neg 7.6 L  Continue with  MVI, trace elements, and chromium in TPN  Continue Zinc 5 gram in TPN Continue CBG/SSI for Cyclic TPN: SSI -CBG 2 hrs after cyclic TPN start -CBG during middle of cyclic TPN infusion -CBG 1 hr after cylic TPN stopped -CBG while off TPN Continue (=4 CBGs per 24 hr period) SSI moderate> resistant Increase insulin in TPN from 10> 15u  Consider add insulin at 0500 as this seems to be when BG readings the highest  Check TPN labs on Mon/Thurs at minimum Monitor for further need of diuresis and adjust as needed for fluid status Holding Continues Lasix 20 mg PO BID 11/15  CarDorothe PeaharmD, BCPS Clinical Pharmacist   07/23/2022 9:12 AM

## 2022-07-23 NOTE — Progress Notes (Addendum)
1910 - received report 1946 - adjusted TPN rate, pt remains up to chair, denies needs 2200 - scheduled medication administration, pt remains up to chair, eakin's pouch suctioned, pt "not ready" to get in bed at this time.  2335 - pt awakened for scheduled medications 0215 - pt resting in bed with eyes closed, respirations even and unlabored, no signs of distress or discomfort noted 0348 - awakened for scheduled medication, would like to wait until later 0445 - scheduled medication administration, eakin's pouch suctioned, suction canister exchanged, denies any other needs

## 2022-07-24 LAB — COMPREHENSIVE METABOLIC PANEL
ALT: 37 U/L (ref 0–44)
AST: 34 U/L (ref 15–41)
Albumin: 2.9 g/dL — ABNORMAL LOW (ref 3.5–5.0)
Alkaline Phosphatase: 183 U/L — ABNORMAL HIGH (ref 38–126)
Anion gap: 7 (ref 5–15)
BUN: 66 mg/dL — ABNORMAL HIGH (ref 6–20)
CO2: 22 mmol/L (ref 22–32)
Calcium: 9.2 mg/dL (ref 8.9–10.3)
Chloride: 110 mmol/L (ref 98–111)
Creatinine, Ser: 0.99 mg/dL (ref 0.44–1.00)
GFR, Estimated: >60 mL/min (ref >60)
Glucose, Bld: 210 mg/dL — ABNORMAL HIGH (ref 70–99)
Potassium: 3.9 mmol/L (ref 3.5–5.1)
Sodium: 139 mmol/L (ref 135–145)
Total Bilirubin: 0.7 mg/dL (ref 0.3–1.2)
Total Protein: 7.2 g/dL (ref 6.5–8.1)

## 2022-07-24 LAB — MAGNESIUM: Magnesium: 2.3 mg/dL (ref 1.7–2.4)

## 2022-07-24 LAB — GLUCOSE, CAPILLARY
Glucose-Capillary: 192 mg/dL — ABNORMAL HIGH (ref 70–99)
Glucose-Capillary: 269 mg/dL — ABNORMAL HIGH (ref 70–99)
Glucose-Capillary: 271 mg/dL — ABNORMAL HIGH (ref 70–99)
Glucose-Capillary: 218 mg/dL — ABNORMAL HIGH (ref 70–99)
Glucose-Capillary: 171 mg/dL — ABNORMAL HIGH (ref 70–99)
Glucose-Capillary: 171 mg/dL — ABNORMAL HIGH (ref 70–99)

## 2022-07-24 LAB — PHOSPHORUS: Phosphorus: 3.9 mg/dL (ref 2.5–4.6)

## 2022-07-24 MED ORDER — ZINC CHLORIDE 1 MG/ML IV SOLN
INTRAVENOUS | Status: AC
  Filled 2022-07-24: qty 681.33

## 2022-07-24 NOTE — Progress Notes (Addendum)
17 - received report 1940 - pt resting with eyes closed, respirations even and unlabored, no signs of distress or discomfort, IV alarming 2035 - scheduled medication administered, no contents to suction at this time 2213 - pt resting with eyes closed, respirations even and unlabored, no obvious signs of discomfort or distress noted 2315 - family at bedside, pt sitting on side of bed, [name]'s Pouch leaking onto the floor.  Abdominal wound re-dressed per wound care order, bed linens changed and floor wiped with "orange top wipes".  0015 - pt assisted to bathroom, brush teeth, and [name]'s Pouch suctioned 0235 - pt resting in bed with eyes closed, respirations even and unlabored, no distress or discomfort noted 0345 - pt resting in bed with eyes closed, respirations even and unlabored, no distress or discomfort noted 0500 - scheduled medication administration, pt's pouch suctioned, mushy stool noted to pouch causing pouch to lift away from skin and watery stool leaks under appliance. New appliance applied. Pt assisted to bathroom.  Denies needs.

## 2022-07-24 NOTE — Progress Notes (Signed)
PHARMACY - TOTAL PARENTERAL NUTRITION CONSULT NOTE   Indication: Prolonged ileus  Patient Measurements: Height: _0  (149.9 cm) Weight: 95 kg (209 lb 7 oz) IBW/kg (Calculated) : 43.2 TPN AdjBW (KG): 55.2 Body mass index is 42.3 kg/m.  Assessment: Debra [name] is a 56 y.o. female s/p laparotomy, excision of greater omental mass, abdominal wall reconstruction with Debra [name] release, appendectomy, and placement of Prevena vac.  Glucose / Insulin: No apparent history of diabetes.  BG 231-271 >with SSI 4x/day + 15 > 20u insulin in TPN. 13 units SSI/24 hrs. (Insulin usage trending up)  Electrolytes: electrolytes WNL Renal: Scr 0.5 -->1.33 > 1.26 > 1> 1.18 > 1.25 > 0.99 Hepatic: No transaminitis. ALK Phos elevated at 183. TG within normal limits GI Imaging: 9/11 CTAP: no new acute issues GI Surgeries / Procedures: s/p laparotomy, excision of greater omental mass, abdominal wall reconstruction with Debra [name] release, appendectomy, and placement of Prevena vac  Central access: 02/15/22 TPN start date: 02/15/22  Nutritional Goals: Goal cyclic TPN over 16 hrs: cyclic TPN over 16 hrs: (provides 102 g of protein and 1817 kcals per day) total volume over 16hrs for calculations=1400 ml(1500 ml total with overfill)  RD Assessment:  Estimated Needs Total Energy Estimated Needs: 1800-2100kcal/day Total Protein Estimated Needs: 90-110g/d Total Fluid Estimated Needs: 1.4-1.6L/day  Current Nutrition:  Full Liquid diet + nutritional supplements, not meeting PO needs yet  Patient walks around in afternoon/evening On loperamide q6h scheduled started 7/18  Plan: Transitioned to *Cyclic* TPN on 1/61.   to run over 16 hours per MD request. Debra [name] at 2000 per discussion with Dietician to allow patient time for walking in afternoon/evenings.  To run over 16 hours: -Start rate at 49 mL/hr for 1 hour. -Increase rate to 98 mL/hr for 13 hours.  -Decrease rate to 49 mL/hr for 1  hour. -Decrease rate to 25 mL/hr for 1 hours, then stop.  Plan 07/24/22 Continue Nutritional Components Amino acids (using 15% Clinisol): 102 g Dextrose 19% = 266 g Lipids (using 20% SMOFlipids): 50.4 g kCal: 1817/24h  Electrolytes in TPN (standard): Na 41mq/L, K 532m/L, Ca 66m266mL, Mg 10 mEq/L, Phos 10 mmol/L.  Continue Cl:Ac 1:2 Continue with  MVI, trace elements, and chromium in TPN  Continue Zinc 5 gram in TPN Continue CBG/SSI for Cyclic TPN: SSI -CBG 2 hrs after cyclic TPN start -CBG during middle of cyclic TPN infusion -CBG 1 hr after cylic TPN stopped -CBG while off TPN Continue (=4 CBGs per 24 hr period) SSI moderate> resistant Increase insulin in TPN from 15> 20u  Consider add insulin at 0500 as this seems to be when BG readings the highest  Check TPN labs on Mon/Thurs at minimum Monitor for further need of diuresis and adjust as needed for fluid status Holding Continues Lasix 20 mg PO BID 11/15  Debra [name]   07/24/2022 12:23 PM

## 2022-07-24 NOTE — Consult Note (Signed)
[facility] Nurse wound follow up Wound type: Midline wound open, peritoneum closed; granulating; there are three stomatized areas visible in the LUQ and LLQ portions of the wound, output remains feculent.  Pouch was changed 07/22/22.  Current [name] pouch with window is intact.  Seal is completely adherent.  Full thickness abdominal wound with 3 fistulas.  Green liquid in pouch.  Patient is up to chair after ambulating to bathroom and I encourage her to use the yankour suction to keep liquid away from pouch in an effort to maintain seal. There is an [name] pouch, (LAWSON # [phone]) pattern at bedside if seal is lost.   Will follow as needed.  Debra Ludwig MSN, RN, FNP-BC CWON Wound, Ostomy, Continence Nurse [facility] [phone] Pager [phone]

## 2022-07-24 NOTE — Progress Notes (Signed)
[facility] SUR[facility]ICAL PROGRESS NOTE (cpt [phone])  Hospital Day(s): 161.   Post op day(s): 159 Days Post-Op.   Interval History:  Patient seen and examined Pain improving [name] intact; Managing effluent with intermittent suction; output 9000 ccs [name] last changed on 11/14 She is on soft diet; on cyclic TPN Ambulating better; PT following again; recommending HHPT Appreciate neurosurgery recommendations  Review of Systems:  Constitutional: denies fever, chills  HEENT: denies cough or congestion  Respiratory: denies any shortness of breath  Cardiovascular: denies chest pain or palpitations  Gastrointestinal: denies abdominal pain, N/V Genitourinary: denies burning with urination or urinary frequency Integumentary: + midline wound MSK: + Back Pain   Vital signs in last 24 hours: [min-max] current  Temp:  [97.6 F (36.4 C)-98 F (36.7 C)] 98 F (36.7 C) (11/16 0810) Pulse Rate:  [79-87] 79 (11/16 0810) Resp:  [16-20] 20 (11/16 0810) BP: (96-116)/(53-74) 96/53 (11/16 0810) SpO2:  [99 %-100 %] 99 % (11/16 0407) Weight:  [95 kg] 95 kg (11/16 0407)     Height: '4\' 11"'$  (149.9 cm) Weight: 95 kg BMI (Calculated): 43.04   Intake/Output last 2 shifts:  11/15 0701 - 11/16 0700 In: 5176 [P.O.:600; I.V.:1081] Out: 1500 [Urine:600; Drains:900]   Physical Exam:  Constitutional: alert, cooperative and no distress  Respiratory: breathing non-labored at rest  Cardiovascular: regular rate and sinus rhythm  Gastrointestinal: Soft, abdominal soreness on the right, non-distended, no rebound/guarding. Integumentary: Midline wound open, peritoneum closed; granulating; there are three stomatized areas visible in the LUQ and LLQ portions of the wound, output remains feculent. There is hypergranulation tissue to the inferior wound edge.   Labs:     Latest Ref Rng & Units 07/13/2022    7:05 AM 07/02/2022   10:13 AM 06/17/2022    5:03 AM  CBC  WBC 4.0 - 10.5 K/uL 4.5  3.7  2.7    Hemoglobin 12.0 - 15.0 g/dL 9.9  10.2  8.1   Hematocrit 36.0 - 46.0 % 31.1  32.6  25.6   Platelets 150 - 400 K/uL 115  84  69       Latest Ref Rng & Units 07/24/2022    5:02 AM 07/23/2022    5:09 AM 07/21/2022    6:50 AM  CMP  Glucose 70 - 99 mg/dL 210  276  256   BUN 6 - 20 mg/dL 66  78  56   Creatinine 0.44 - 1.00 mg/dL 0.99  1.25  1.18   Sodium 135 - 145 mmol/L 139  139  139   Potassium 3.5 - 5.1 mmol/L 3.9  4.1  4.0   Chloride 98 - 111 mmol/L 110  109  112   CO2 22 - 32 mmol/L '22  21  20   '$ Calcium 8.9 - 10.3 mg/dL 9.2  9.5  9.1   Total Protein 6.5 - 8.1 g/dL 7.2   7.3   Total Bilirubin 0.3 - 1.2 mg/dL 0.7   0.6   Alkaline Phos 38 - 126 U/L 183   172   AST 15 - 41 U/L 34   30   ALT 0 - 44 U/L 37   29     Imaging studies: No new pertinent imaging studies  Assessment/Plan:  56 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   - New 11/16: Soft diet, back pain better  - Wound Care: Last change 11/14. [name] pouch with "window" but not on intermittent or continuous suction. Patient seems to like this better. She will need intermittent suction with Yankauer to control effluent. Likely be beneficial to suction before ambulation.   - Continue TPN; cyclic - Appreciate dietary assistance with trace elements; iron supplementation            - Monitor abdominal examination; on-going bowel function            - Pain control prn; antemetic prn            - Therapies back on board; recommending HHPT; continue to follow    - Discharge Planning: Anticipate lengthy admission and potential eventual transfer; no new progress regarding disposition    All of the above findings and recommendations were discussed with the patient, and  the medical team, and all of patient's questions were answered to her expressed satisfaction.  -- [name], PA-C [facility] 07/24/2022, 8:24 AM M-F: 7am - 4pm

## 2022-07-25 LAB — GLUCOSE, CAPILLARY
Glucose-Capillary: 191 mg/dL — ABNORMAL HIGH (ref 70–99)
Glucose-Capillary: 167 mg/dL — ABNORMAL HIGH (ref 70–99)
Glucose-Capillary: 151 mg/dL — ABNORMAL HIGH (ref 70–99)
Glucose-Capillary: 187 mg/dL — ABNORMAL HIGH (ref 70–99)

## 2022-07-25 MED ORDER — ZINC CHLORIDE 1 MG/ML IV SOLN
INTRAVENOUS | Status: AC
  Filled 2022-07-25: qty 681.33

## 2022-07-25 NOTE — Consult Note (Signed)
[facility] Nurse fistula consult note Fistula type/location: midline x 3 (2 stomatized) on in the distal edge of the wound bed at 5 o'clock  Perifistula assessment: wound bed; granulating in nicely, re epithelialized skin in wound bed at 11 o'clock.  Mesh visible from 11-12 o'clock  New pattern created for large [name] pouch, hung on the wall in the patient's room Treatment options for perifistula skin: removed pouch and ALL of the old residue/barrier rings/melted [name] from the skin Skin prep applied liberally to periwound Hypergranulation tissue much less now along the distal rim of the wound bed  Lined wound edges with strips of [name] material. Bedside nurse in with WOC to observe pouch change and learn steps for pouch change  Output bloody, thick Fistula pouching: Large eakin with window attached to LWS at 59mHG for 1 hour to obtain seal. Staff to DC and convert to using Younker suction PRN and espcially prior to up in chair or ambulation  Education provided: NA   WOC nursing team will follow along for support with fistula care and teaching   [place]

## 2022-07-25 NOTE — Consult Note (Signed)
WOC consulted for Debra [name], notified staff [place] not on this campus to till later today, requested staff to change. They have supplies and instructions for changing pouch.  WOC nursing will follow up when one is available on that campus, however we do not want this to sit leaking on patients skin for hours until that time Surgery aware as well.  [place], [place], [place]

## 2022-07-25 NOTE — Progress Notes (Signed)
Physical Therapy Treatment Patient Details Name: Debra [name] MRN: [id number] DOB: 12-Jan-1966 Today's Date: 07/25/2022   History of Present Illness Pt is a 56 y.o. female with PMH including hypothyroidism, iron deficient anemia, colon cancer.  Here s/p mass removal with hernia repair and appendectomy 6/8, bowel perforation repair 6/10. [name] pouch was initially placed 6/20 and changed on 7/10. Re-evaluation consult placed as pt with acute back pain and muscle spasms, not able to get OOB in 3 days. Pt is POD 148.    PT Comments    Pt seen for PT tx with pt agreeable. Professional interpreter Desma Mcgregor) present to assist. Pt is able to complete supine>sit with mod I with reliance on hospital bed features & extra time. Pt is able to complete STS without AD but then requires RW for support 2/2 back & R hip pain. Pt ambulates 2 laps, the first with RW & supervision, the 2nd without AD & CGA. Pt demonstrates significantly decreased gait speed, noting this is slightly slower than her baseline. PT educated pt on need for mobility every hour & that she may be stiff when she first moves. Pt is making progress in regards to mobility.  Updated PT goals & POC remains appropriate at this time. Will continue to follow pt acutely to address balance, strengthening, and gait with LRAD.    Recommendations for follow up therapy are one component of a multi-disciplinary discharge planning process, led by the attending physician.  Recommendations may be updated based on patient status, additional functional criteria and insurance authorization.  Follow Up Recommendations  Home health PT Can patient physically be transported by private vehicle: Yes   Assistance Recommended at Discharge Set up Supervision/Assistance  Patient can return home with the following A little help with walking and/or transfers;A little help with bathing/dressing/bathroom;Assist for transportation;Help with stairs or ramp for  entrance   Equipment Recommendations  Rolling walker (2 wheels)    Recommendations for Other Services       Precautions / Restrictions Precautions Precautions: Fall Precaution Comments: PICC line R UE, [name] pouch abdomen, mod fall Restrictions Weight Bearing Restrictions: No RUE Weight Bearing: Weight bearing as tolerated Other Position/Activity Restrictions: Abdominal surgery/[name] pouch     Mobility  Bed Mobility Overal bed mobility: Needs Assistance Bed Mobility: Supine to Sit     Supine to sit: Modified independent (Device/Increase time), HOB elevated     General bed mobility comments: Pt transitioned supine>sit with HOB elevated, use of bed rails, & extra time.    Transfers Overall transfer level: Needs assistance   Transfers: Sit to/from Stand Sit to Stand: Supervision           General transfer comment: STS from EOB without AD but then reports need to have RW for pain management.    Ambulation/Gait Ambulation/Gait assistance: Supervision, Min guard Gait Distance (Feet): 200 Feet (+ 150 ft) Assistive device: Rolling walker (2 wheels), None Gait Pattern/deviations: Step-through pattern, Decreased step length - right, Decreased step length - left, Decreased stride length, Wide base of support Gait velocity: significantly decreased     General Gait Details: Pt initially ambbulates 1 lap with RW & supervision, then ambulates without AD with CGA. Pt experiences 1 mild LOB in bathroom, requiring min assist to correct. Pt endorses dizziness during gait but then states it dissipates. Pt also endorse mild nausea.   Stairs             Wheelchair Mobility    Modified Rankin (Stroke Patients Only)  Balance   Sitting-balance support: Feet supported Sitting balance-Leahy Scale: Good Sitting balance - Comments: toilets without assistance/LOB   Standing balance support: No upper extremity supported, During functional activity Standing  balance-Leahy Scale: Fair                              Cognition Arousal/Alertness: Awake/alert Behavior During Therapy: WFL for tasks assessed/performed Overall Cognitive Status: Within Functional Limits for tasks assessed                                          Exercises      General Comments General comments (skin integrity, edema, etc.): Pt with continent void on toilet, performing peri hygiene without assistance. Pt also performs grooming tasks standing at sink with supervision.      Pertinent Vitals/Pain Pain Assessment Pain Assessment: Faces Faces Pain Scale: Hurts even more Pain Location: back & R hip Pain Descriptors / Indicators: Grimacing, Discomfort Pain Intervention(s): Monitored during session, Repositioned    Home Living                          Prior Function            PT Goals (current goals can now be found in the care plan section) Acute Rehab PT Goals Patient Stated Goal: to go home PT Goal Formulation: With patient Time For Goal Achievement: 08/06/22 Potential to Achieve Goals: Good Progress towards PT goals: Progressing toward goals    Frequency    Min 2X/week      PT Plan Current plan remains appropriate    Co-evaluation              AM-PAC PT "6 Clicks" Mobility   Outcome Measure  Help needed turning from your back to your side while in a flat bed without using bedrails?: A Little Help needed moving from lying on your back to sitting on the side of a flat bed without using bedrails?: A Little Help needed moving to and from a bed to a chair (including a wheelchair)?: A Little Help needed standing up from a chair using your arms (e.g., wheelchair or bedside chair)?: A Little Help needed to walk in hospital room?: A Little Help needed climbing 3-5 steps with a railing? : A Little 6 Click Score: 18    End of Session   Activity Tolerance: Patient tolerated treatment well Patient left:  in chair;with call bell/phone within reach   PT Visit Diagnosis: Muscle weakness (generalized) (M62.81);Pain;Difficulty in walking, not elsewhere classified (R26.2) Pain - Right/Left: Right (back, R hip) Pain - part of body: Hip     Time: 6295-2841 PT Time Calculation (min) (ACUTE ONLY): 27 min  Charges:  $Therapeutic Activity: 23-37 mins                     Lavone Nian, PT, DPT 07/25/22, 4:09 PM   Waunita Schooner 07/25/2022, 4:07 PM

## 2022-07-25 NOTE — Progress Notes (Signed)
[facility] SUR[facility]ICAL PROGRESS NOTE (cpt [phone])  Hospital Day(s): 162.   Post op day(s): 160 Days Post-Op.   Interval History:  Patient seen and examined Pain improving [name] intact; Managing effluent with intermittent suction; output 650 ccs [name] last changed on 11/14 She is on soft diet; on cyclic TPN Ambulating better; PT following again; recommending HHPT Appreciate neurosurgery recommendations  Review of Systems:  Constitutional: denies fever, chills  HEENT: denies cough or congestion  Respiratory: denies any shortness of breath  Cardiovascular: denies chest pain or palpitations  Gastrointestinal: denies abdominal pain, N/V Genitourinary: denies burning with urination or urinary frequency Integumentary: + midline wound MSK: + Back Pain   Vital signs in last 24 hours: [min-max] current  Temp:  [97.9 F (36.6 C)-98.2 F (36.8 C)] 98.2 F (36.8 C) (11/17 0905) Pulse Rate:  [79-80] 80 (11/17 0905) Resp:  [16-20] 20 (11/17 0324) BP: (97-102)/(57-67) 97/57 (11/17 0905) SpO2:  [93 %-100 %] 98 % (11/17 0905) Weight:  [94 kg] 94 kg (11/17 0324)     Height: '4\' 11"'$  (149.9 cm) Weight: 94 kg BMI (Calculated): 43.04   Intake/Output last 2 shifts:  11/16 0701 - 11/17 0700 In: 1603.6 [P.O.:920; I.V.:683.6] Out: 651 [Urine:1; Drains:650]   Physical Exam:  Constitutional: alert, cooperative and no distress  Respiratory: breathing non-labored at rest  Cardiovascular: regular rate and sinus rhythm  Gastrointestinal: Soft, abdominal soreness on the right, non-distended, no rebound/guarding. Integumentary: Midline wound open, peritoneum closed; granulating; there are three stomatized areas visible in the LUQ and LLQ portions of the wound, output remains feculent. There is hypergranulation tissue to the inferior wound edge.   Labs:     Latest Ref Rng & Units 07/13/2022    7:05 AM 07/02/2022   10:13 AM 06/17/2022    5:03 AM  CBC  WBC 4.0 - 10.5 K/uL 4.5  3.7  2.7    Hemoglobin 12.0 - 15.0 g/dL 9.9  10.2  8.1   Hematocrit 36.0 - 46.0 % 31.1  32.6  25.6   Platelets 150 - 400 K/uL 115  84  69       Latest Ref Rng & Units 07/24/2022    5:02 AM 07/23/2022    5:09 AM 07/21/2022    6:50 AM  CMP  Glucose 70 - 99 mg/dL 210  276  256   BUN 6 - 20 mg/dL 66  78  56   Creatinine 0.44 - 1.00 mg/dL 0.99  1.25  1.18   Sodium 135 - 145 mmol/L 139  139  139   Potassium 3.5 - 5.1 mmol/L 3.9  4.1  4.0   Chloride 98 - 111 mmol/L 110  109  112   CO2 22 - 32 mmol/L '22  21  20   '$ Calcium 8.9 - 10.3 mg/dL 9.2  9.5  9.1   Total Protein 6.5 - 8.1 g/dL 7.2   7.3   Total Bilirubin 0.3 - 1.2 mg/dL 0.7   0.6   Alkaline Phos 38 - 126 U/L 183   172   AST 15 - 41 U/L 34   30   ALT 0 - 44 U/L 37   29     Imaging studies: No new pertinent imaging studies  Assessment/Plan:  56 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   - New 11/17: Nothing new; I am available if assistance needed with pouch changes   - Wound Care: Last change 11/14. [name] pouch with "window" but not on intermittent or continuous suction. Patient seems to like this better. She will need intermittent suction with Yankauer to control effluent. Likely be beneficial to suction before ambulation.   - Continue TPN; cyclic - Appreciate dietary assistance with trace elements; iron supplementation            - Monitor abdominal examination; on-going bowel function            - Pain control prn; antemetic prn            - Therapies back on board; recommending HHPT; continue to follow    - Discharge Planning: Anticipate lengthy admission and potential eventual transfer; no new progress regarding disposition    All of the above findings and  recommendations were discussed with the patient, and the medical team, and all of patient's questions were answered to her expressed satisfaction.  -- [name], PA-C [facility] 07/25/2022, 9:27 AM M-F: 7am - 4pm

## 2022-07-25 NOTE — Progress Notes (Signed)
PHARMACY - TOTAL PARENTERAL NUTRITION CONSULT NOTE   Indication: Prolonged ileus  Patient Measurements: Height: '4\' 11"'$  (149.9 cm) Weight: 94 kg (207 lb 3.7 oz) IBW/kg (Calculated) : 43.2 TPN AdjBW (KG): 55.2 Body mass index is 41.86 kg/m.  Assessment: Debra [name] is a 56 y.o. female s/p laparotomy, excision of greater omental mass, abdominal wall reconstruction with [name], appendectomy, and placement of Prevena vac.  Glucose / Insulin:  --No apparent history of diabetes --rSSI 4x/day + 20u insulin in TPN --24h BG: 171 - 271 --SSI: 19u last 24h Electrolytes: Within normal limits Renal: Scr < 0.5 majority of admission now increased to ~ 1. Increase may be secondary to resolution of malnourishment / sarcopenia? May have AKI secondary to Lasix which is now on hold Hepatic: LFTs within normal limits. Alkaline phosphatase elevated GI Imaging: 9/11 CTAP: no new acute issues GI Surgeries / Procedures: s/p laparotomy, excision of greater omental mass, abdominal wall reconstruction with [name], appendectomy, and placement of Prevena vac  Central access: 02/15/22 TPN start date: 02/15/22  Nutritional Goals: Goal cyclic TPN over 16 hrs: cyclic TPN over 16 hrs: (provides 102 g of protein and 1817 kcals per day) total volume over 16hrs for calculations=1400 ml(1500 ml total with overfill)  RD Assessment:  Estimated Needs Total Energy Estimated Needs: 1800-2100kcal/day Total Protein Estimated Needs: 90-110g/d Total Fluid Estimated Needs: 1.4-1.6L/day  Current Nutrition:  Soft diet + nutritional supplements, not meeting PO needs yet   Plan: Transitioned to *Cyclic* TPN on 7/62.   to run over 16 hours per MD request. [name] at 2000 per discussion with Dietician to allow patient time for walking in afternoon/evenings.  To run over 16 hours: -Start rate at 49 mL/hr for 1 hour. -Increase rate to 98 mL/hr for 13 hours.  -Decrease rate to 49 mL/hr for 1  hour. -Decrease rate to 25 mL/hr for 1 hours, then stop.  Plan:  Continue Nutritional Components Amino acids (using 15% Clinisol): 102 g Dextrose 19% = 266 g Lipids (using 20% SMOFlipids): 50.4 g kCal: 1817/24h  Electrolytes in TPN (standard): Na 34mq/L, K 520m/L, Ca 58m38mL, Mg 10 mEq/L, Phos 10 mmol/L, Cl:Ac 1:1 Add MVI, trace elements, chromium, zinc in TPN Continue CBG/SSI for Cyclic TPN:  -CBG 2 hrs after cyclic TPN start -CBG during middle of cyclic TPN infusion -CBG 1 hr after cylic TPN stopped -CBG while off TPN Continue resistant (=4 CBGs per 24 hr period) SSI + 20u insulin in TPN Check TPN labs on Mon/Thurs at minimum Monitor for further need of diuresis and adjust as needed for fluid status Holding Lasix 20 mg PO BID 11/15  [name]11/17/2023 8:38 AM

## 2022-07-25 NOTE — Progress Notes (Signed)
OT Cancellation Note  Patient Details Name: Makenlee Mckeag MRN: [id number] DOB: 03-07-1966   Cancelled Treatment:    Reason Eval/Treat Not Completed: Other (comment). OT attempting to see pt for treatment but eakin pouch leaking. RN notified for nursing care. OT to re-attempt at next available time.   Darleen Crocker, MS, OTR/L , CBIS ascom [phone]  07/25/22, 1:24 PM

## 2022-07-25 NOTE — TOC Progression Note (Signed)
Transition of Care [facility]) - Progression Note    Patient Details  Name: Debra [name] MRN: [id number] Date of Birth: [birth year]  [facility] CM/SW Contact  Beverly Sessions, RN Phone Number: 07/25/2022, 2:41 PM  Clinical Narrative:    Per surgery no update. Case has been reviewed with new TOC supervisor    Expected Discharge Plan: [place] Barriers to Discharge: Continued Medical Work up  Expected Discharge Plan and Services Expected Discharge Plan: [place]                                               Social Determinants of Health (SDOH) Interventions    Readmission Risk Interventions     No data to display

## 2022-07-26 LAB — GLUCOSE, CAPILLARY
Glucose-Capillary: 192 mg/dL — ABNORMAL HIGH (ref 70–99)
Glucose-Capillary: 182 mg/dL — ABNORMAL HIGH (ref 70–99)
Glucose-Capillary: 95 mg/dL (ref 70–99)
Glucose-Capillary: 106 mg/dL — ABNORMAL HIGH (ref 70–99)
Glucose-Capillary: 203 mg/dL — ABNORMAL HIGH (ref 70–99)

## 2022-07-26 MED ORDER — ZINC CHLORIDE 1 MG/ML IV SOLN
INTRAVENOUS | Status: AC
  Filled 2022-07-26: qty 681.33

## 2022-07-26 NOTE — TOC Progression Note (Addendum)
Transition of Care [facility]) - Progression Note    Patient Details  Name: Debra [name] MRN: [id number] Date of Birth: 02-12-1966  [facility]) CM/SW Contact  Izola Price, RN Phone Number: 07/26/2022, 11:32 AM  Clinical Narrative: 11/18: Patient started on cyclic TPN today per Pharmacy progress note. PT recommending HH, patient has [employer] [place] insurance.  Wound care RN following for fistula care. No TOC update at this time. TOC Supervisor aware and following this care.  Simmie Davies RN CM       Expected Discharge Plan: [place] Barriers to Discharge: Continued Medical Work up  Expected Discharge Plan and Services Expected Discharge Plan: [place]                                               Social Determinants of Health (SDOH) Interventions    Readmission Risk Interventions     No data to display

## 2022-07-26 NOTE — Progress Notes (Signed)
Physical Therapy Treatment Patient Details Name: Debra [name] MRN: [id number] DOB: [birth year] Today's Date: 07/26/2022   History of Present Illness Pt is a 56 y.o. female with PMH including hypothyroidism, iron deficient anemia, colon cancer.  Here s/p mass removal with hernia repair and appendectomy 6/8, bowel perforation repair 6/10. [name] pouch was initially placed 6/20 and changed on 7/10. Re-evaluation consult placed as pt with acute back pain and muscle spasms, not able to get OOB in 3 days. Pt is POD 148.    PT Comments    Pt is overall supervision level with gait and tranfers.  Pt continues to experience pain in back limiting activity tolerance, and she requires RW to decrease pain when up. Pt is overall steady with low level functional standing balance tasks. Session was performed with online interpreter. Current PT d/c plan is still appropriate.  Recommendations for follow up therapy are one component of a multi-disciplinary discharge planning process, led by the attending physician.  Recommendations may be updated based on patient status, additional functional criteria and insurance authorization.  Follow Up Recommendations  Home health PT Can patient physically be transported by private vehicle: Yes   Assistance Recommended at Discharge Set up Supervision/Assistance  Patient can return home with the following A little help with walking and/or transfers;A little help with bathing/dressing/bathroom;Assist for transportation;Help with stairs or ramp for entrance   Equipment Recommendations  Rolling walker (2 wheels)    Recommendations for Other Services       Precautions / Restrictions Precautions Precautions: Fall Precaution Comments: PICC line R UE, [name] pouch abdomen, mod fall Restrictions Weight Bearing Restrictions: No RUE Weight Bearing: Weight bearing as tolerated Other Position/Activity Restrictions: Abdominal surgery/[name] pouch     Mobility  Bed  Mobility Overal bed mobility: Needs Assistance Bed Mobility: Supine to Sit Rolling: Min assist (with rail) Sidelying to sit: Supervision       General bed mobility comments: Pt transitioned supine>sit with HOB elevated, use of bed rails, & extra time and Min A.    Transfers Overall transfer level: Needs assistance Equipment used: Rolling walker (2 wheels)   Sit to Stand: Supervision   Step pivot transfers: Supervision       General transfer comment: STS from EOB without AD but then reports need to have RW for pain management.    Ambulation/Gait Ambulation/Gait assistance: Supervision Gait Distance (Feet): 150 Feet (x2) Assistive device: Rolling walker (2 wheels), None Gait Pattern/deviations: Step-through pattern, Decreased step length - right, Decreased step length - left, Decreased stride length, Wide base of support Gait velocity: significantly decreased     General Gait Details: Pt initially ambulates 1 lap with RW & supervision, then ambulates without AD for 34f but resumed with walker due to back pain and finished lap around nursing station.   Stairs             Wheelchair Mobility    Modified Rankin (Stroke Patients Only)       Balance Overall balance assessment: Modified Independent Sitting-balance support: Feet supported Sitting balance-Leahy Scale: Good Sitting balance - Comments: toilets without assistance/LOB used rail on wall.   Standing balance support: No upper extremity supported, During functional activity   Standing balance comment: light UE support on RW, increasing to moderate UE support with fatigue                            Cognition Arousal/Alertness: Awake/alert Behavior During Therapy: [facility]/performed  Overall Cognitive Status: Within Functional Limits for tasks assessed Area of Impairment: Following commands, Safety/judgement                       Following Commands: Follows one step  commands consistently Safety/Judgement: Decreased awareness of safety, Decreased awareness of deficits Awareness: Intellectual   General Comments: did not appear anxious this date        Exercises      General Comments        Pertinent Vitals/Pain Pain Assessment Pain Assessment: Faces Faces Pain Scale: Hurts even more Pain Location: back & R hip Pain Descriptors / Indicators: Grimacing, Discomfort Pain Intervention(s): Limited activity within patient's tolerance    Home Living                          Prior Function            PT Goals (current goals can now be found in the care plan section) Acute Rehab PT Goals Patient Stated Goal: to go home PT Goal Formulation: With patient Time For Goal Achievement: 08/06/22 Potential to Achieve Goals: Good Progress towards PT goals: Progressing toward goals    Frequency    Min 2X/week      PT Plan Current plan remains appropriate    Co-evaluation              AM-PAC PT "6 Clicks" Mobility   Outcome Measure  Help needed turning from your back to your side while in a flat bed without using bedrails?: A Little Help needed moving from lying on your back to sitting on the side of a flat bed without using bedrails?: A Little Help needed moving to and from a bed to a chair (including a wheelchair)?: A Little Help needed standing up from a chair using your arms (e.g., wheelchair or bedside chair)?: A Little   Help needed climbing 3-5 steps with a railing? : A Little 6 Click Score: 15    End of Session Equipment Utilized During Treatment: Gait belt Activity Tolerance: Patient tolerated treatment well Patient left: in chair;with call bell/phone within reach Nurse Communication: Mobility status;Other (comment) PT Visit Diagnosis: Muscle weakness (generalized) (M62.81);Pain;Difficulty in walking, not elsewhere classified (R26.2) Pain - Right/Left: Right Pain - part of body: Hip     Time: 6301-6010 PT  Time Calculation (min) (ACUTE ONLY): 30 min  Charges:  $Gait Training: 8-22 mins $Therapeutic Activity: 8-22 mins                     Bjorn Loser, PTA  07/26/22, 1:24 PM

## 2022-07-26 NOTE — Progress Notes (Signed)
Subjective:  CC: Debra [name] is a 56 y.o. female  Hospital stay day 163, 161 Days Post-Op s/p re-opening of laparotomy for repair of small bowel perforation following initial laparotomy, excision of greater omental mass, abdominal wall reconstruction with [name], appendectomy, and placement of Prevena vac on 06/08.   HPI: No acute issues reported overnight.  ROS:  General: Denies weight loss, weight gain, fatigue, fevers, chills, and night sweats. Heart: Denies chest pain, palpitations, racing heart, irregular heartbeat, leg pain or swelling, and decreased activity tolerance. Respiratory: Denies breathing difficulty, shortness of breath, wheezing, cough, and sputum. GI: Denies change in appetite, heartburn, nausea, vomiting, constipation, diarrhea, and blood in stool. GU: Denies difficulty urinating, pain with urinating, urgency, frequency, blood in urine.   Objective:   Temp:  [97.6 F (36.4 C)-98 F (36.7 C)] 97.7 F (36.5 C) (11/18 0747) Pulse Rate:  [77-93] 78 (11/18 0747) Resp:  [16-20] 16 (11/18 0747) BP: (102-138)/(54-64) 102/57 (11/18 0747) SpO2:  [98 %-99 %] 98 % (11/18 0747)     Height: '4\' 11"'$  (149.9 cm) Weight: 94 kg BMI (Calculated): 43.04   Intake/Output this shift:   Intake/Output Summary (Last 24 hours) at 07/26/2022 1325 Last data filed at 07/26/2022 0429 Gross per 24 hour  Intake 1218.06 ml  Output 400 ml  Net 818.06 ml    Constitutional :  alert, cooperative, appears stated age, and no distress  Respiratory:  clear to auscultation bilaterally  Cardiovascular:  regular rate and rhythm  Gastrointestinal: soft, non-tender; bowel sounds normal; no masses,  no organomegaly.   Skin: Cool and moist.  [name]'s pouch intact over midline abdominal wound.  Wound base remains with no evidence of active bleeding, good granulation tissue.  Psychiatric: Normal affect, non-agitated, not confused       LABS:     Latest Ref Rng & Units 07/24/2022     5:02 AM 07/23/2022    5:09 AM 07/21/2022    6:50 AM  CMP  Glucose 70 - 99 mg/dL 210  276  256   BUN 6 - 20 mg/dL 66  78  56   Creatinine 0.44 - 1.00 mg/dL 0.99  1.25  1.18   Sodium 135 - 145 mmol/L 139  139  139   Potassium 3.5 - 5.1 mmol/L 3.9  4.1  4.0   Chloride 98 - 111 mmol/L 110  109  112   CO2 22 - 32 mmol/L '22  21  20   '$ Calcium 8.9 - 10.3 mg/dL 9.2  9.5  9.1   Total Protein 6.5 - 8.1 g/dL 7.2   7.3   Total Bilirubin 0.3 - 1.2 mg/dL 0.7   0.6   Alkaline Phos 38 - 126 U/L 183   172   AST 15 - 41 U/L 34   30   ALT 0 - 44 U/L 37   29       Latest Ref Rng & Units 07/13/2022    7:05 AM 07/02/2022   10:13 AM 06/17/2022    5:03 AM  CBC  WBC 4.0 - 10.5 K/uL 4.5  3.7  2.7   Hemoglobin 12.0 - 15.0 g/dL 9.9  10.2  8.1   Hematocrit 36.0 - 46.0 % 31.1  32.6  25.6   Platelets 150 - 400 K/uL 115  84  69     RADS: N/a Assessment:   s/p re-opening of laparotomy for repair of small bowel perforation following initial laparotomy, excision of greater omental mass, abdominal wall reconstruction with  Reeves Stoppa release, appendectomy, and placement of Prevena vac on 06/08.  No acute issues.  We will continue current management.  labs/images/medications/previous chart entries reviewed personally and relevant changes/updates noted above.

## 2022-07-26 NOTE — Progress Notes (Signed)
PHARMACY - TOTAL PARENTERAL NUTRITION CONSULT NOTE   Indication: Prolonged ileus  Patient Measurements: Height: '4\' 11"'$  (149.9 cm) Weight: 94 kg (207 lb 3.7 oz) IBW/kg (Calculated) : 43.2 TPN AdjBW (KG): 55.2 Body mass index is 41.86 kg/m.  Assessment: Debra [name] is a 56 y.o. female s/p laparotomy, excision of greater omental mass, abdominal wall reconstruction with Debra [name] release, appendectomy, and placement of Prevena vac.  Glucose / Insulin:  --No apparent history of diabetes --rSSI 4x/day + 20u insulin in TPN --24h BG: 151 - 192 --SSI: 20u last 24h Electrolytes: Within normal limits Renal: Scr < 0.5 majority of admission now increased to ~ 1. Increase may be secondary to resolution of malnourishment / sarcopenia? May have AKI secondary to Lasix which is now on hold Hepatic: LFTs within normal limits. Alkaline phosphatase elevated GI Imaging: 9/11 CTAP: no new acute issues GI Surgeries / Procedures: s/p laparotomy, excision of greater omental mass, abdominal wall reconstruction with Debra [name] release, appendectomy, and placement of Prevena vac  Central access: 02/15/22 TPN start date: 02/15/22  Nutritional Goals: Goal cyclic TPN over 16 hrs: cyclic TPN over 16 hrs: (provides 102 g of protein and 1817 kcals per day) total volume over 16hrs for calculations=1400 ml(1500 ml total with overfill)  RD Assessment:  Estimated Needs Total Energy Estimated Needs: 1800-2100kcal/day Total Protein Estimated Needs: 90-110g/d Total Fluid Estimated Needs: 1.4-1.6L/day  Current Nutrition:  Soft diet + nutritional supplements, not meeting PO needs yet   Plan: Transitioned to *Cyclic* TPN on 1/01.   to run over 16 hours per MD request. Debra [name] at 2000 per discussion with Dietician to allow patient time for walking in afternoon/evenings.  To run over 16 hours: -Start rate at 49 mL/hr for 1 hour. -Increase rate to 98 mL/hr for 13 hours.  -Decrease rate to 49 mL/hr for 1  hour. -Decrease rate to 25 mL/hr for 1 hours, then stop.  Plan:  Continue Nutritional Components Amino acids (using 15% Clinisol): 102 g Dextrose 19% = 266 g Lipids (using 20% SMOFlipids): 50.4 g kCal: 1817/24h  Electrolytes in TPN (standard): Na 59mq/L, K 561m/L, Ca 35m63mL, Mg 10 mEq/L, Phos 10 mmol/L, Cl:Ac 1:1 Add MVI, trace elements, chromium, zinc in TPN Continue CBG/SSI for Cyclic TPN:  -CBG 2 hrs after cyclic TPN start -CBG during middle of cyclic TPN infusion -CBG 1 hr after cylic TPN stopped -CBG while off TPN Continue resistant (=4 CBGs per 24 hr period) SSI + 20u insulin in TPN. May consider adding some low dose basal insulin Check TPN labs on Mon/Thurs at minimum Monitor for further need of diuresis and adjust as needed for fluid status Holding Lasix 20 mg PO BID 11/15  Debra Gutter11/18/2023 8:10 AM

## 2022-07-27 LAB — GLUCOSE, CAPILLARY
Glucose-Capillary: 162 mg/dL — ABNORMAL HIGH (ref 70–99)
Glucose-Capillary: 123 mg/dL — ABNORMAL HIGH (ref 70–99)
Glucose-Capillary: 140 mg/dL — ABNORMAL HIGH (ref 70–99)
Glucose-Capillary: 163 mg/dL — ABNORMAL HIGH (ref 70–99)

## 2022-07-27 MED ORDER — ZINC CHLORIDE 1 MG/ML IV SOLN
INTRAVENOUS | Status: AC
  Filled 2022-07-27: qty 681.33

## 2022-07-27 NOTE — Progress Notes (Signed)
PHARMACY - TOTAL PARENTERAL NUTRITION CONSULT NOTE   Indication: Prolonged ileus  Patient Measurements: Height: '4\' 11"'$  (149.9 cm) Weight: 94 kg (207 lb 3.7 oz) IBW/kg (Calculated) : 43.2 TPN AdjBW (KG): 55.2 Body mass index is 41.86 kg/m.  Assessment: Debra [name] is a 56 y.o. female s/p laparotomy, excision of greater omental mass, abdominal wall reconstruction with Debra [name] release, appendectomy, and placement of Prevena vac.  Glucose / Insulin:  --No apparent history of diabetes --rSSI 4x/day + 20u insulin in TPN --24h BG: 95 - 203 --SSI: 18u last 24h Electrolytes: Within normal limits Renal: Scr < 0.5 majority of admission now increased to ~ 1. Increase may be secondary to resolution of malnourishment / sarcopenia? May have AKI secondary to Lasix which is now on hold Hepatic: LFTs within normal limits. Alkaline phosphatase elevated GI Imaging: 9/11 CTAP: no new acute issues GI Surgeries / Procedures: s/p laparotomy, excision of greater omental mass, abdominal wall reconstruction with Debra [name] release, appendectomy, and placement of Prevena vac  Central access: 02/15/22 TPN start date: 02/15/22  Nutritional Goals: Goal cyclic TPN over 16 hrs: cyclic TPN over 16 hrs: (provides 102 g of protein and 1817 kcals per day) total volume over 16hrs for calculations=1400 ml(1500 ml total with overfill)  RD Assessment:  Estimated Needs Total Energy Estimated Needs: 1800-2100kcal/day Total Protein Estimated Needs: 90-110g/d Total Fluid Estimated Needs: 1.4-1.6L/day  Current Nutrition:  Soft diet + nutritional supplements, not meeting PO needs yet   Plan: Transitioned to *Cyclic* TPN on 8/46.   to run over 16 hours per MD request. [name] at 2000 per discussion with Dietician to allow patient time for walking in afternoon/evenings.  To run over 16 hours: -Start rate at 49 mL/hr for 1 hour. -Increase rate to 98 mL/hr for 13 hours.  -Decrease rate to 49 mL/hr for 1  hour. -Decrease rate to 25 mL/hr for 1 hours, then stop.  Plan:  Continue Nutritional Components Amino acids (using 15% Clinisol): 102 g Dextrose 19% = 266 g Lipids (using 20% SMOFlipids): 50.4 g kCal: 1817/24h  Electrolytes in TPN (standard): Na 27mq/L, K 541m/L, Ca 74m87mL, Mg 10 mEq/L, Phos 10 mmol/L, Cl:Ac 1:1 Add MVI, trace elements, chromium, zinc in TPN Continue CBG/SSI for Cyclic TPN:  -CBG 2 hrs after cyclic TPN start -CBG during middle of cyclic TPN infusion -CBG 1 hr after cylic TPN stopped -CBG while off TPN Continue resistant (=4 CBGs per 24 hr period) SSI + 20u insulin in TPN. May consider adding some low dose basal insulin Check TPN labs on Mon/Thurs at minimum Monitor for further need of diuresis and adjust as needed for fluid status Holding Lasix 20 mg PO BID 11/15  [name]11/19/2023 8:16 AM

## 2022-07-27 NOTE — Plan of Care (Signed)
Pt alert and oriented x 4. Pt had nausea and zofran given at hs. Pt refused to take meds at 0000. Compazine given this am due to vomiting and pt reported that it was improved but still refused am meds.  Problem: Nutrition: Goal: Adequate nutrition will be maintained Outcome: Not Progressing   Problem: Nutrition: Goal: Adequate nutrition will be maintained Outcome: Not Progressing   Problem: Clinical Measurements: Goal: Will remain free from infection Outcome: Progressing   Problem: Activity: Goal: Risk for activity intolerance will decrease Outcome: Progressing   Problem: Coping: Goal: Level of anxiety will decrease Outcome: Progressing   Problem: Pain Managment: Goal: General experience of comfort will improve Outcome: Progressing

## 2022-07-27 NOTE — TOC Progression Note (Signed)
Transition of Care [facility]) - Progression Note    Patient Details  Name: Debra [name] MRN: [id number] Date of Birth: [birth year]  Transition of Care [facility] CM/SW Contact  Izola Price, RN Phone Number: 07/27/2022, 4:11 PM  Clinical Narrative: 11/19: Per surgery progress note, patient have recurrent emesis. No TOC update for today. Throughout this hospital stay [facility] supervisor, [facility] director, and MD advisor have been updated on case or are following progress. Simmie Davies RN CM      Expected Discharge Plan: [place] Barriers to Discharge: Continued Medical Work up  Expected Discharge Plan and Services Expected Discharge Plan: [place]                                               Social Determinants of Health (SDOH) Interventions    Readmission Risk Interventions     No data to display

## 2022-07-27 NOTE — Progress Notes (Signed)
Subjective:  CC: Debra [name] is a 56 y.o. female  Hospital stay day 164, 162 Days Post-Op s/p re-opening of laparotomy for repair of small bowel perforation following initial laparotomy, excision of greater omental mass, abdominal wall reconstruction with [name], appendectomy, and placement of Prevena vac on 06/08.   HPI: Episodes of emesis again.  With decreased appetite.  Patient states she is unable to take any oral meds at this time, but she does not complaining of any persistent nausea.  ROS:  General: Denies weight loss, weight gain, fatigue, fevers, chills, and night sweats. Heart: Denies chest pain, palpitations, racing heart, irregular heartbeat, leg pain or swelling, and decreased activity tolerance. Respiratory: Denies breathing difficulty, shortness of breath, wheezing, cough, and sputum. GI: Denies change in appetite, heartburn, nausea, vomiting, constipation, diarrhea, and blood in stool. GU: Denies difficulty urinating, pain with urinating, urgency, frequency, blood in urine.   Objective:   Temp:  [97.9 F (36.6 C)-98.4 F (36.9 C)] 98.4 F (36.9 C) (11/19 0806) Pulse Rate:  [74-93] 90 (11/19 0806) Resp:  [16-20] 16 (11/19 0806) BP: (102-120)/(52-68) 102/56 (11/19 0806) SpO2:  [97 %-100 %] 97 % (11/19 0806) Weight:  [94 kg] 94 kg (11/19 0426)     Height: '4\' 11"'$  (149.9 cm) Weight: 94 kg BMI (Calculated): 43.04   Intake/Output this shift:   Intake/Output Summary (Last 24 hours) at 07/27/2022 1115 Last data filed at 07/27/2022 0815 Gross per 24 hour  Intake 539.85 ml  Output 200 ml  Net 339.85 ml    Constitutional :  alert, cooperative, appears stated age, and no distress  Respiratory:  clear to auscultation bilaterally  Cardiovascular:  regular rate and rhythm  Gastrointestinal: soft, non-tender; bowel sounds normal; no masses,  no organomegaly.   Skin: Cool and moist.  [name]'s pouch intact over midline abdominal wound.  Wound base remains  with no evidence of active bleeding, good granulation tissue.  Psychiatric: Normal affect, non-agitated, not confused       LABS:     Latest Ref Rng & Units 07/24/2022    5:02 AM 07/23/2022    5:09 AM 07/21/2022    6:50 AM  CMP  Glucose 70 - 99 mg/dL 210  276  256   BUN 6 - 20 mg/dL 66  78  56   Creatinine 0.44 - 1.00 mg/dL 0.99  1.25  1.18   Sodium 135 - 145 mmol/L 139  139  139   Potassium 3.5 - 5.1 mmol/L 3.9  4.1  4.0   Chloride 98 - 111 mmol/L 110  109  112   CO2 22 - 32 mmol/L '22  21  20   '$ Calcium 8.9 - 10.3 mg/dL 9.2  9.5  9.1   Total Protein 6.5 - 8.1 g/dL 7.2   7.3   Total Bilirubin 0.3 - 1.2 mg/dL 0.7   0.6   Alkaline Phos 38 - 126 U/L 183   172   AST 15 - 41 U/L 34   30   ALT 0 - 44 U/L 37   29       Latest Ref Rng & Units 07/13/2022    7:05 AM 07/02/2022   10:13 AM 06/17/2022    5:03 AM  CBC  WBC 4.0 - 10.5 K/uL 4.5  3.7  2.7   Hemoglobin 12.0 - 15.0 g/dL 9.9  10.2  8.1   Hematocrit 36.0 - 46.0 % 31.1  32.6  25.6   Platelets 150 - 400 K/uL 115  70  69     RADS: N/a Assessment:   s/p re-opening of laparotomy for repair of small bowel perforation following initial laparotomy, excision of greater omental mass, abdominal wall reconstruction with [name], appendectomy, and placement of Prevena vac on 06/08.  Episodes of emesis again.  No obvious cause.  Recommended going slow with any diet today and continuing current antinausea and GI prophylaxis medication.  Encounter performed through interpreter and all questions addressed  labs/images/medications/previous chart entries reviewed personally and relevant changes/updates noted above.

## 2022-07-28 LAB — COMPREHENSIVE METABOLIC PANEL
ALT: 35 U/L (ref 0–44)
AST: 38 U/L (ref 15–41)
Albumin: 2.6 g/dL — ABNORMAL LOW (ref 3.5–5.0)
Alkaline Phosphatase: 250 U/L — ABNORMAL HIGH (ref 38–126)
Anion gap: 4 — ABNORMAL LOW (ref 5–15)
BUN: 28 mg/dL — ABNORMAL HIGH (ref 6–20)
CO2: 24 mmol/L (ref 22–32)
Calcium: 8.2 mg/dL — ABNORMAL LOW (ref 8.9–10.3)
Chloride: 108 mmol/L (ref 98–111)
Creatinine, Ser: 0.57 mg/dL (ref 0.44–1.00)
GFR, Estimated: >60 mL/min (ref >60)
Glucose, Bld: 169 mg/dL — ABNORMAL HIGH (ref 70–99)
Potassium: 4.3 mmol/L (ref 3.5–5.1)
Sodium: 136 mmol/L (ref 135–145)
Total Bilirubin: 0.5 mg/dL (ref 0.3–1.2)
Total Protein: 6.8 g/dL (ref 6.5–8.1)

## 2022-07-28 LAB — GLUCOSE, CAPILLARY
Glucose-Capillary: 183 mg/dL — ABNORMAL HIGH (ref 70–99)
Glucose-Capillary: 127 mg/dL — ABNORMAL HIGH (ref 70–99)
Glucose-Capillary: 124 mg/dL — ABNORMAL HIGH (ref 70–99)
Glucose-Capillary: 132 mg/dL — ABNORMAL HIGH (ref 70–99)

## 2022-07-28 LAB — MAGNESIUM: Magnesium: 1.9 mg/dL (ref 1.7–2.4)

## 2022-07-28 LAB — PHOSPHORUS: Phosphorus: 2.6 mg/dL (ref 2.5–4.6)

## 2022-07-28 MED ORDER — ZINC CHLORIDE 1 MG/ML IV SOLN
INTRAVENOUS | Status: AC
  Filled 2022-07-28: qty 681.33

## 2022-07-28 NOTE — Progress Notes (Signed)
Physical Therapy Treatment Patient Details Name: Debra [name] MRN: [id number] DOB: 28-Nov-1965 Today's Date: 07/28/2022   History of Present Illness Pt is a 56 y.o. female with PMH including hypothyroidism, iron deficient anemia, colon cancer.  Here s/p mass removal with hernia repair and appendectomy 6/8, bowel perforation repair 6/10. [name] pouch was initially placed 6/20 and changed on 7/10. Re-evaluation was ordered 11/5 as pt with acute back pain and muscle spasms, not able to get OOB.    PT Comments    Pt in bathroom with OT upon PT arrival; pt initially appearing nauseas in bathroom (nurse notified) but pt reporting nausea resolved and pt agreeable to walking in hallway.  Nurse reports recently suctioning (pt's [name] pouch).  Spanish interpreter Sophia [phone] via video interpreter utilized.  During session pt SBA with ambulating 340 feet with RW use (pt with slower gait speed but steady).  Pt reporting 2/10 low back pain with ambulation and 1-2/10 low back pain resting in recliner end of session.  Will continue to focus on strengthening and progressive functional mobility during hospitalization.    Recommendations for follow up therapy are one component of a multi-disciplinary discharge planning process, led by the attending physician.  Recommendations may be updated based on patient status, additional functional criteria and insurance authorization.  Follow Up Recommendations  Home health PT Can patient physically be transported by private vehicle: Yes   Assistance Recommended at Discharge Set up Supervision/Assistance  Patient can return home with the following A little help with walking and/or transfers;A little help with bathing/dressing/bathroom;Assist for transportation;Help with stairs or ramp for entrance   Equipment Recommendations  Rolling walker (2 wheels)    Recommendations for Other Services       Precautions / Restrictions Precautions Precautions: Fall;Other  (comment) (abdominal surgery) Precaution Comments: PICC line R UE, [name] pouch abdomen, mod fall Restrictions Weight Bearing Restrictions: No RUE Weight Bearing: Weight bearing as tolerated Other Position/Activity Restrictions: Abdominal surgery/[name] pouch     Mobility  Bed Mobility               General bed mobility comments: Deferred (pt standing with OT present upon PT arrival)    Transfers Overall transfer level: Needs assistance   Transfers: Sit to/from Stand, Bed to chair/wheelchair/BSC Sit to Stand:  (CGA stand to sit)   Step pivot transfers: Min guard (hand hold assist)       General transfer comment: pt declined to use walker to transfer to chair end of ambulation so utilized hand hold assist    Ambulation/Gait Ambulation/Gait assistance: Supervision Gait Distance (Feet): 340 Feet Assistive device: Rolling walker (2 wheels)   Gait velocity: significantly decreased     General Gait Details: step through gait pattern; steady with RW use   Stairs             Wheelchair Mobility    Modified Rankin (Stroke Patients Only)       Balance Overall balance assessment: Modified Independent Sitting-balance support: No upper extremity supported, Feet supported Sitting balance-Leahy Scale: Good Sitting balance - Comments: steady sitting reaching within BOS   Standing balance support: Bilateral upper extremity supported, During functional activity, Reliant on assistive device for balance Standing balance-Leahy Scale: Good Standing balance comment: steady ambulating with RW use                            Cognition Arousal/Alertness: Awake/alert Behavior During Therapy: WFL for tasks assessed/performed Overall Cognitive Status:  Within Functional Limits for tasks assessed                                 General Comments: Spanish interpreter utilized        Exercises      General Comments  Nursing cleared pt for  participation in physical therapy.  Pt agreeable to PT session.  3 visitors present during session.      Pertinent Vitals/Pain Pain Assessment Pain Assessment: 0-10 Pain Score: 2  Pain Location: back and R hip Pain Descriptors / Indicators: Aching Pain Intervention(s): Limited activity within patient's tolerance, Monitored during session, Repositioned HR WFL during sessions activities.    Home Living Family/patient expects to be discharged to:: Private residence Living Arrangements: Children Available Help at Discharge: Available 24 hours/day;Family Type of Home: House Home Access: Level entry     Alternate Level Stairs-Number of Steps: flight Home Layout: Two level;Bed/bath upstairs Home Equipment: None      Prior Function            PT Goals (current goals can now be found in the care plan section) Acute Rehab PT Goals Patient Stated Goal: to go home PT Goal Formulation: With patient Time For Goal Achievement: 08/06/22 Potential to Achieve Goals: Good Progress towards PT goals: Progressing toward goals    Frequency    Min 2X/week      PT Plan Current plan remains appropriate    Co-evaluation              AM-PAC PT "6 Clicks" Mobility   Outcome Measure  Help needed turning from your back to your side while in a flat bed without using bedrails?: A Little Help needed moving from lying on your back to sitting on the side of a flat bed without using bedrails?: A Little Help needed moving to and from a bed to a chair (including a wheelchair)?: A Little Help needed standing up from a chair using your arms (e.g., wheelchair or bedside chair)?: A Little Help needed to walk in hospital room?: A Little Help needed climbing 3-5 steps with a railing? : A Little 6 Click Score: 18    End of Session   Activity Tolerance: Patient tolerated treatment well Patient left: in chair;with call bell/phone within reach;with chair alarm set;with family/visitor  present Nurse Communication: Mobility status;Precautions PT Visit Diagnosis: Muscle weakness (generalized) (M62.81);Pain;Difficulty in walking, not elsewhere classified (R26.2) Pain - Right/Left: Right Pain - part of body: Hip     Time: 1287-8676 PT Time Calculation (min) (ACUTE ONLY): 19 min  Charges:  $Therapeutic Exercise: 8-22 mins                     Leitha Bleak, PT 07/28/22, 12:34 PM

## 2022-07-28 NOTE — Evaluation (Signed)
Occupational Therapy Re-Evaluation Patient Details Name: Debra [name] MRN: [id number] DOB: 11/20/1965 Today's Date: 07/28/2022   History of Present Illness Pt is a 56 y.o. female with PMH including hypothyroidism, iron deficient anemia, colon cancer.  Here s/p mass removal with hernia repair and appendectomy 6/8, bowel perforation repair 6/10. [name] pouch was initially placed 6/20 and changed on 7/10. Re-evaluation was ordered 11/5 as pt with acute back pain and muscle spasms, not able to get OOB.   Clinical Impression   Patient received for OT re-evaluation. Spanish language interpreter utilized via ipad during session. See flowsheet below for details of function. Generally, patient requiring supervision with RW for functional mobility, and MOD (I) for ADLs. Will require use of AE (reacher, sock aide, long-handled sponge, and long-handled shoe horn or slip-on shoes) at home to achieve MOD (I) status; has maximized function in acute care setting without AE available. D/c pt from OT services at this time, with pt continuing to work with mobility specialists to ensure improvement in activity tolerance Patient with no further need for OT in acute care; discharge OT services.    Recommendations for follow up therapy are one component of a multi-disciplinary discharge planning process, led by the attending physician.  Recommendations may be updated based on patient status, additional functional criteria and insurance authorization.   Follow Up Recommendations  Home health OT     Assistance Recommended at Discharge Intermittent Supervision/Assistance  Patient can return home with the following A little help with bathing/dressing/bathroom;Assistance with cooking/housework;Direct supervision/assist for medications management;Assist for transportation;Help with stairs or ramp for entrance    Functional Status Assessment   Anticipate if trained/provided with AE, pt would achieve MOD (I) BADL  status.  Equipment Recommendations  Other (comment) (reacher, sock aide, long-handled sponge, long-handled shoe horn)    Recommendations for Other Services       Precautions / Restrictions Precautions Precautions: Fall;Other (comment) (abdominal surgery) Precaution Comments: PICC line R UE, [name] pouch abdomen, mod fall Restrictions Weight Bearing Restrictions: No RUE Weight Bearing: Weight bearing as tolerated Other Position/Activity Restrictions: Abdominal surgery/[name] pouch      Mobility Bed Mobility               General bed mobility comments: Pt received seated in recliner and receptive to OT session. At end of OT session left in care of PT walking in hallway.    Transfers Overall transfer level: Needs assistance Equipment used: Rolling walker (2 wheels) Transfers: Sit to/from Stand Sit to Stand: Supervision                  Balance                                           ADL either performed or assessed with clinical judgement   ADL Overall ADL's : Needs assistance/impaired                                       General ADL Comments: Pt states that at baseline she is able to lean forward to don BIL socks/shoes/pants. However right now is unable to do so due to abdominal surgeries. Anticipate pt will be MOD (I) with AE (reacher, sock aid) and MOD (I) for showering with long-handled sponge. Pt moving well with RW today supervision,  no loss of balance; walked into bathroom and able to t/f to toilet without assistance, washed hands without assistance. Recommend pt engage with HHOT in home context once returned home to ensure pt is able to complete ADL/IADL as needed at home, but in hospital no further OT required as this time. Pt should continue to mobilize with PT and mobility specialists.     Vision         Perception     Praxis      Pertinent Vitals/Pain Pain Assessment Pain Assessment: 0-10 Pain Score: 2   Pain Location: back and R hip Pain Descriptors / Indicators: Aching     Hand Dominance     Extremity/Trunk Assessment Upper Extremity Assessment Upper Extremity Assessment: Overall WFL for tasks assessed (Upon MMT, 3+/5 UE strength BIL)   Lower Extremity Assessment Lower Extremity Assessment: Defer to PT evaluation       Communication Communication Communication: Prefers language other than [place];Interpreter utilized (Spanish language interpreter [place] utilized via Shiner, then disconnected and Jillyn Ledger [phone] utilized via ipad)   Cognition Arousal/Alertness: Awake/alert Behavior During Therapy: WFL for tasks assessed/performed Overall Cognitive Status: Within Functional Limits for tasks assessed Area of Impairment: Following commands, Safety/judgement                       Following Commands: Follows one step commands consistently       General Comments: pt pleasant, calm, follows commands     General Comments       Exercises     Shoulder Instructions      Home Living Family/patient expects to be discharged to:: Private residence Living Arrangements: Children Available Help at Discharge: Available 24 hours/day;Family Type of Home: House Home Access: Level entry     Home Layout: Two level;Bed/bath upstairs Alternate Level Stairs-Number of Steps: flight             Home Equipment: None          Prior Functioning/Environment Prior Level of Function : Independent/Modified Independent             Mobility Comments: using RW with nursing staff ADLs Comments: prior to admission, was (I) showering; sink baths at hospital        OT Problem List:        OT Treatment/Interventions:      OT Goals(Current goals can be found in the care plan section) Acute Rehab OT Goals Patient Stated Goal: Get better OT Goal Formulation: With patient Potential to Achieve Goals:  (pt d/c from acute OT services)  OT Frequency:  (Pt discharged from OT  services at this time)    Co-evaluation              AM-PAC OT "6 Clicks" Daily Activity     Outcome Measure Help from another person eating meals?: None Help from another person taking care of personal grooming?: None Help from another person toileting, which includes using toliet, bedpan, or urinal?: None Help from another person bathing (including washing, rinsing, drying)?: A Little Help from another person to put on and taking off regular upper body clothing?: None Help from another person to put on and taking off regular lower body clothing?: A Little 6 Click Score: 22   End of Session Equipment Utilized During Treatment: Rolling walker (2 wheels) (ipad interpreter) Nurse Communication: Mobility status  Activity Tolerance: Patient tolerated treatment well Patient left:  (standing in bathroom with PT assistance)  OT Visit Diagnosis: Unsteadiness on feet (  R26.81);Pain Pain - Right/Left: Right Pain - part of body: Hip                Time: 4917-9150 OT Time Calculation (min): 20 min Charges:  OT General Charges $OT Visit: 1 Visit OT Evaluation $OT Re-eval: 1 Re-eval  Waymon Amato, MS, OTR/L   Vania Rea 07/28/2022, 2:38 PM

## 2022-07-28 NOTE — Progress Notes (Signed)
PHARMACY - TOTAL PARENTERAL NUTRITION CONSULT NOTE   Indication: Prolonged ileus  Patient Measurements: Height: '4\' 11"'$  (149.9 cm) Weight: 93 kg (205 lb 0.4 oz) IBW/kg (Calculated) : 43.2 TPN AdjBW (KG): 55.2 Body mass index is 41.41 kg/m.  Assessment: Debra [name] is a 56 y.o. female s/p laparotomy, excision of greater omental mass, abdominal wall reconstruction with [name], appendectomy, and placement of Prevena vac.  Glucose / Insulin:  --No apparent history of diabetes --rSSI 4x/day + 20u insulin in TPN --24h BG: 95 - 203 --SSI: 18u last 24h Electrolytes: Within normal limits Renal: Scr < 0.5 majority of admission now increased to ~ 1. Increase may be secondary to resolution of malnourishment / sarcopenia? May have AKI secondary to Lasix which is now on hold Hepatic: LFTs within normal limits. Alkaline phosphatase elevated GI Imaging: 9/11 CTAP: no new acute issues GI Surgeries / Procedures: s/p laparotomy, excision of greater omental mass, abdominal wall reconstruction with [name], appendectomy, and placement of Prevena vac  Central access: 02/15/22 TPN start date: 02/15/22  Nutritional Goals: Goal cyclic TPN over 16 hrs: cyclic TPN over 16 hrs: (provides 102 g of protein and 1817 kcals per day) total volume over 16hrs for calculations=1400 ml(1500 ml total with overfill)  RD Assessment:  Estimated Needs Total Energy Estimated Needs: 1800-2100kcal/day Total Protein Estimated Needs: 90-110g/d Total Fluid Estimated Needs: 1.4-1.6L/day  Current Nutrition:  Soft diet + nutritional supplements, not meeting PO needs yet   Plan: Transitioned to *Cyclic* TPN on 9/98.   to run over 16 hours per MD request. [name] at 2000 per discussion with Dietician to allow patient time for walking in afternoon/evenings.  To run over 16 hours: -Start rate at 49 mL/hr for 1 hour. -Increase rate to 98 mL/hr for 13 hours.  -Decrease rate to 49 mL/hr for 1  hour. -Decrease rate to 25 mL/hr for 1 hours, then stop.  Plan:  Continue Nutritional Components Amino acids (using 15% Clinisol): 102 g Dextrose 19% = 266 g Lipids (using 20% SMOFlipids): 50.4 g kCal: 1817/24h  Electrolytes in TPN (standard): Na 5mq/L, K 579m/L, Ca 5m66mL, Mg 10 mEq/L, Phos 10 mmol/L, Cl:Ac 1:1 Add MVI, trace elements, chromium, zinc in TPN Continue CBG/SSI for Cyclic TPN:  -CBG 2 hrs after cyclic TPN start -CBG during middle of cyclic TPN infusion -CBG 1 hr after cylic TPN stopped -CBG while off TPN Continue resistant (=4 CBGs per 24 hr period) SSI + 20u insulin in TPN. May consider adding some low dose basal insulin Check TPN labs on Mon/Thurs at minimum Monitor for further need of diuresis and adjust as needed for fluid status  [name]11/20/2023 7:05 AM

## 2022-07-28 NOTE — Progress Notes (Signed)
[facility] SURGICAL PROGRESS NOTE (cpt [phone])  Hospital Day(s): 165.   Post op day(s): 163 Days Post-Op.   Interval History:  Patient seen and examined Emesis over the weekend again; improved; seems to be intermittent [name] intact; Managing effluent with intermittent suction; output 650 ccs [name] last changed on 11/17 She is on soft diet; on cyclic TPN Ambulating better; PT remains on board; recommending HHPT  Review of Systems:  Constitutional: denies fever, chills  HEENT: denies cough or congestion  Respiratory: denies any shortness of breath  Cardiovascular: denies chest pain or palpitations  Gastrointestinal: denies abdominal pain, N/V Genitourinary: denies burning with urination or urinary frequency Integumentary: + midline wound MSK: + Back Pain   Vital signs in last 24 hours: [min-max] current  Temp:  [98.4 F (36.9 C)-98.6 F (37 C)] 98.4 F (36.9 C) (11/20 0453) Pulse Rate:  [79-90] 79 (11/20 0453) Resp:  [16-21] 21 (11/20 0453) BP: (102-108)/(56-61) 103/57 (11/20 0453) SpO2:  [97 %-100 %] 97 % (11/20 0453) Weight:  [93 kg] 93 kg (11/20 0500)     Height: '4\' 11"'$  (149.9 cm) Weight: 93 kg BMI (Calculated): 43.04   Intake/Output last 2 shifts:  11/19 0701 - 11/20 0700 In: 868 [I.V.:868] Out: 370 [Drains:370]   Physical Exam:  Constitutional: alert, cooperative and no distress  Respiratory: breathing non-labored at rest  Cardiovascular: regular rate and sinus rhythm  Gastrointestinal: Soft, abdominal soreness on the right, non-distended, no rebound/guarding. Integumentary: Midline wound open, peritoneum closed; granulating; there are three stomatized areas visible in the LUQ and LLQ portions of the wound, output remains feculent. There is hypergranulation tissue to the inferior wound edge.   Labs:     Latest Ref Rng & Units 07/13/2022    7:05 AM 07/02/2022   10:13 AM 06/17/2022    5:03 AM  CBC  WBC 4.0 - 10.5 K/uL 4.5  3.7  2.7    Hemoglobin 12.0 - 15.0 g/dL 9.9  10.2  8.1   Hematocrit 36.0 - 46.0 % 31.1  32.6  25.6   Platelets 150 - 400 K/uL 115  84  69       Latest Ref Rng & Units 07/28/2022    4:41 AM 07/24/2022    5:02 AM 07/23/2022    5:09 AM  CMP  Glucose 70 - 99 mg/dL 169  210  276   BUN 6 - 20 mg/dL 28  66  78   Creatinine 0.44 - 1.00 mg/dL 0.57  0.99  1.25   Sodium 135 - 145 mmol/L 136  139  139   Potassium 3.5 - 5.1 mmol/L 4.3  3.9  4.1   Chloride 98 - 111 mmol/L 108  110  109   CO2 22 - 32 mmol/L '24  22  21   '$ Calcium 8.9 - 10.3 mg/dL 8.2  9.2  9.5   Total Protein 6.5 - 8.1 g/dL 6.8  7.2    Total Bilirubin 0.3 - 1.2 mg/dL 0.5  0.7    Alkaline Phos 38 - 126 U/L 250  183    AST 15 - 41 U/L 38  34    ALT 0 - 44 U/L 35  37      Imaging studies: No new pertinent imaging studies  Assessment/Plan:  56 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   - New 11/20: Nothing new  - Wound Care: Last change 11/17. [name] pouch with "window" but not on intermittent or continuous suction. Patient seems to like this better. She will need intermittent suction with Yankauer to control effluent. Likely be beneficial to suction before ambulation.   - Continue TPN; cyclic - Appreciate dietary assistance with trace elements; iron supplementation            - Monitor abdominal examination; on-going bowel function            - Pain control prn; antemetic prn            - Therapies back on board; recommending HHPT; continue to follow    - Discharge Planning: Anticipate lengthy admission and potential eventual transfer; no new progress regarding disposition    All of the above findings and recommendations were discussed with the patient, and the medical  team, and all of patient's questions were answered to her expressed satisfaction.  -- [name], PA-C [facility] 07/28/2022, 7:33 AM M-F: 7am - 4pm

## 2022-07-29 ENCOUNTER — Inpatient Hospital Stay: Payer: Medicaid Other

## 2022-07-29 LAB — GLUCOSE, CAPILLARY
Glucose-Capillary: 189 mg/dL — ABNORMAL HIGH (ref 70–99)
Glucose-Capillary: 156 mg/dL — ABNORMAL HIGH (ref 70–99)
Glucose-Capillary: 119 mg/dL — ABNORMAL HIGH (ref 70–99)
Glucose-Capillary: 169 mg/dL — ABNORMAL HIGH (ref 70–99)

## 2022-07-29 MED ORDER — ZINC CHLORIDE 1 MG/ML IV SOLN
INTRAVENOUS | Status: AC
  Filled 2022-07-29: qty 681.33

## 2022-07-29 NOTE — Progress Notes (Signed)
Nutrition Follow-up  DOCUMENTATION CODES:   Obesity unspecified  INTERVENTION:   Continue cyclic TPN per pharmacy (16 hrs)- provides 1817kcal/day and 102g/day protein   Ensure Enlive po TID, each supplement provides 350 kcal and 20 grams of protein.  MVI, chromium and trace elements in TPN   Daily weights   Vitamin D2-  50,000 units po weekly   Zing 33m daily added to TPN   Recheck vitamin/trace element labs 12/7  NUTRITION DIAGNOSIS:   Increased nutrient needs related to wound healing, catabolic illness as evidenced by estimated needs.  GOAL:   Patient will meet greater than or equal to 90% of their needs -met with TPN   MONITOR:   PO intake, Supplement acceptance, Labs, Weight trends, Diet advancement, I & O's, TPN  ASSESSMENT:   56y/o female with h/o hypothyroidism, COVID 19 (3/21), kidney stones and stage 3 colon cancer (s/p left hemicolectomy 5/21 and chemotherapy) who is admitted for new pelvic mass now s/p laparotomy 6/8 (with excision of pelvic mass from greater omentum, abdominal wall reconstruction with bilateral myocutaneous flaps and mesh, incisional hernia repair, appendectomy repair and VAC placement) complicated by bowel perforation s/p reopening of recent laparotomy 6/10 (with repair of small bowel perforation, excision of mesh, placement of two phasix mesh and VAC placement). Pathology returned as metastatic adenocarcinoma. Pt with new L1 compression fracture.   Pt continues to tolerate TPN well at goal rate; TPN is currently being cycled for 16 hours. Triglycerides wnl and are being checked monthly. Hyperglycemia improved with insulin in TPN. Volume status improved with diuresis; pt remains up ~7lbs from admission. Will recheck vitamin labs 12/7 and then every other month once normal. Will check manganese levels monthly. Pt continues to have poor oral intake. Pt with continued nausea. [name] pouch remains in place with 3751moutput.   Medications reviewed  and include: D2, insulin, synthroid, protonix, carafate  -Selenium 105 wnl, Manganese 25.6(H), copper 121, Zinc 69 - 11/2 -Chromium 1.4 wnl, Iodine 57.9 wnl- 10/2 -Iron 31.0 wnl, TIBC 360, ferritin 33 wnl, transferrin 272, folate 10.5 wnl, B12 291- 10/16 -Vitamin B1- 117.2, vitamin D 19.46(L), vitamin A 14.3(L), B6 11.9 wnl, vitamin C 0.8, vitamin E 13.4 wnl, vitamin K 0.64 wnl- 10/16  Labs reviewed: Na 136 wnl, K 4.3 wnl, BUN 28(H), P 2.6 wnl, Mg 1.9 wnl- 11/20 Triglycerides- 84-11/15 Cbgs- 156, 189 x 24 hrs  Diet Order:    Diet Order             DIET SOFT Room service appropriate? Yes; Fluid consistency: Thin  Diet effective now                  EDUCATION NEEDS:   Not appropriate for education at this time  Skin:  Skin Assessment: Reviewed RN Assessment ( Midline wound: 11cm x 12cm )  Last BM:  11/20  Height:   Ht Readings from Last 1 Encounters:  03/01/22 _0  (1.499 m)    Weight:   Wt Readings from Last 1 Encounters:  07/29/22 92.6 kg    Ideal Body Weight:  44.3 kg  BMI:  Body mass index is 41.23 kg/m.  Estimated Nutritional Needs:   Kcal:  1800-2100kcal/day  Protein:  90-110g/d  Fluid:  1.4-1.6L/day  CaKoleen DistanceS, RD, LDN Please refer to AMCity [facility] Centeror [facility] and/or RD on-call/weekend/after hours pager

## 2022-07-29 NOTE — TOC Progression Note (Signed)
Transition of Care [facility]) - Progression Note    Patient Details  Name: Debra [name] MRN: [id number] Date of Birth: [birth year]  Transition of [facility]) CM/SW Contact  Beverly Sessions, RN Phone Number: 07/29/2022, 1:56 PM  Clinical Narrative:     Per Delsa Sale with [facility] they care not able to consider patient due to medicaid and her longterm need for TPN  Expected Discharge Plan: [place] Barriers to Discharge: Continued Medical Work up  Expected Discharge Plan and Services Expected Discharge Plan: [place]                                               Social Determinants of Health (SDOH) Interventions    Readmission Risk Interventions     No data to display

## 2022-07-29 NOTE — Progress Notes (Signed)
PHARMACY - TOTAL PARENTERAL NUTRITION CONSULT NOTE   Indication: Prolonged ileus  Patient Measurements: Height: '4\' 11"'$  (149.9 cm) Weight: 92.6 kg (204 lb 2.3 oz) IBW/kg (Calculated) : 43.2 TPN AdjBW (KG): 55.2 Body mass index is 41.23 kg/m.  Assessment: Debra [name] is a 56 y.o. female s/p laparotomy, excision of greater omental mass, abdominal wall reconstruction with [name], appendectomy, and placement of Prevena vac.  Glucose / Insulin:  --No apparent history of diabetes --rSSI 4x/day + 20u insulin in TPN --24h BG: 127 - 189 --SSI: 13u last 24h Electrolytes: Within normal limits Renal: Scr < 0.5 majority of admission now increased to ~ 1. Increase may be secondary to resolution of malnourishment / sarcopenia? May have AKI secondary to Lasix which is now on hold Hepatic: LFTs within normal limits. Alkaline phosphatase elevated GI Imaging: 9/11 CTAP: no new acute issues GI Surgeries / Procedures: s/p laparotomy, excision of greater omental mass, abdominal wall reconstruction with [name], appendectomy, and placement of Prevena vac  Central access: 02/15/22 TPN start date: 02/15/22  Nutritional Goals: Goal cyclic TPN over 16 hrs: cyclic TPN over 16 hrs: (provides 102 g of protein and 1817 kcals per day) total volume over 16hrs for calculations=1400 ml(1500 ml total with overfill)  RD Assessment:  Estimated Needs Total Energy Estimated Needs: 1800-2100kcal/day Total Protein Estimated Needs: 90-110g/d Total Fluid Estimated Needs: 1.4-1.6L/day  Current Nutrition:  Soft diet + nutritional supplements, not meeting PO needs yet   Plan: Transitioned to *Cyclic* TPN on 8/83.   to run over 16 hours per MD request. [name] at 2000 per discussion with Dietician to allow patient time for walking in afternoon/evenings.  To run over 16 hours: -Start rate at 49 mL/hr for 1 hour. -Increase rate to 98 mL/hr for 13 hours.  -Decrease rate to 49 mL/hr for 1  hour. -Decrease rate to 25 mL/hr for 1 hours, then stop.  Plan:  Continue Nutritional Components Amino acids (using 15% Clinisol): 102 g Dextrose 19% = 266 g Lipids (using 20% SMOFlipids): 50.4 g kCal: 1817/24h  Electrolytes in TPN (standard): Na 11mq/L, K 5231m/L, Ca 31m59mL, Mg 10 mEq/L, Phos 10 mmol/L, Cl:Ac 1:1 Add MVI, trace elements, chromium, zinc in TPN Continue CBG/SSI for Cyclic TPN:  -CBG 2 hrs after cyclic TPN start -CBG during middle of cyclic TPN infusion -CBG 1 hr after cylic TPN stopped -CBG while off TPN Continue resistant (=4 CBGs per 24 hr period) SSI + 20u insulin in TPN Check TPN labs on Mon/Thurs at minimum Monitor for further need of diuresis and adjust as needed for fluid status  [name]11/21/2023 7:07 AM

## 2022-07-29 NOTE — TOC Progression Note (Addendum)
Transition of Care [facility]) - Progression Note    Patient Details  Name: Debra [name] MRN: [id number] Date of Birth: 1966/06/29  Transition of [facility]) CM/SW Contact  Ross Ludwig, [place] Phone Number: 07/29/2022, 2:45 PM  Clinical Narrative:     This Probation officer discussed with [facility] executive director Eustaquio Boyden that per physicians patient would benefit from [facility].  This Probation officer discussed that per Anderson Malta at [facility] they can not accept patient with Medicaid.  Per Development worker, international aid, this writer was advised to escalate the case to administration and they can discuss with [facility] if patient can be accepted.  Contacted Ihor Gully, she will speak to [employer] and follow up with this Probation officer.      Expected Discharge Plan: [place] Barriers to Discharge: Continued Medical Work up  Expected Discharge Plan and Services Expected Discharge Plan: [place]                                               Social Determinants of Health (SDOH) Interventions    Readmission Risk Interventions     No data to display

## 2022-07-29 NOTE — TOC Progression Note (Addendum)
Transition of Care [facility]) - Progression Note    Patient Details  Name: Debra [name] MRN: [id number] Date of Birth: 07-08-1966  Transition of Care [facility]) CM/SW Contact  Beverly Sessions, RN Phone Number: 07/29/2022, 10:27 AM  Clinical Narrative:     Per Surgery no new progress.  TOC supervisor updated   Expected Discharge Plan: [place] Barriers to Discharge: Continued Medical Work up  Expected Discharge Plan and Services Expected Discharge Plan: [place]                                               Social Determinants of Health (SDOH) Interventions    Readmission Risk Interventions     No data to display

## 2022-07-29 NOTE — Progress Notes (Signed)
[facility] SURGICAL PROGRESS NOTE (cpt [phone])  Hospital Day(s): 166.   Post op day(s): 164 Days Post-Op.   Interval History:  Patient seen and examined No nausea/emesis  [name] intact; Managing effluent with intermittent suction; output 370 ccs documented [name] last changed on 11/17 She is on soft diet; on cyclic TPN Ambulating better; PT remains on board; recommending HHPT  Review of Systems:  Constitutional: denies fever, chills  HEENT: denies cough or congestion  Respiratory: denies any shortness of breath  Cardiovascular: denies chest pain or palpitations  Gastrointestinal: denies abdominal pain, N/V Genitourinary: denies burning with urination or urinary frequency Integumentary: + midline wound MSK: + Back Pain   Vital signs in last 24 hours: [min-max] current  Temp:  [97.9 F (36.6 C)-98.6 F (37 C)] 98.2 F (36.8 C) (11/21 0744) Pulse Rate:  [76-92] 76 (11/21 0744) Resp:  [16-18] 18 (11/21 0744) BP: (93-109)/(56-71) 93/56 (11/21 0744) SpO2:  [100 %] 100 % (11/21 0744) Weight:  [92.6 kg] 92.6 kg (11/21 0500)     Height: '4\' 11"'$  (149.9 cm) Weight: 92.6 kg BMI (Calculated): 43.04   Intake/Output last 2 shifts:  11/20 0701 - 11/21 0700 In: 1436.1 [P.O.:120; I.V.:1316.1] Out: 370 [Drains:370]   Physical Exam:  Constitutional: alert, cooperative and no distress  Respiratory: breathing non-labored at rest  Cardiovascular: regular rate and sinus rhythm  Gastrointestinal: Soft, abdominal soreness on the right, non-distended, no rebound/guarding. Integumentary: Midline wound open, peritoneum closed; granulating; there are three stomatized areas visible in the LUQ and LLQ portions of the wound, output remains feculent. There is hypergranulation tissue to the inferior wound edge.   Labs:     Latest Ref Rng & Units 07/13/2022    7:05 AM 07/02/2022   10:13 AM 06/17/2022    5:03 AM  CBC  WBC 4.0 - 10.5 K/uL 4.5  3.7  2.7   Hemoglobin 12.0 - 15.0 g/dL  9.9  10.2  8.1   Hematocrit 36.0 - 46.0 % 31.1  32.6  25.6   Platelets 150 - 400 K/uL 115  84  69       Latest Ref Rng & Units 07/28/2022    4:41 AM 07/24/2022    5:02 AM 07/23/2022    5:09 AM  CMP  Glucose 70 - 99 mg/dL 169  210  276   BUN 6 - 20 mg/dL 28  66  78   Creatinine 0.44 - 1.00 mg/dL 0.57  0.99  1.25   Sodium 135 - 145 mmol/L 136  139  139   Potassium 3.5 - 5.1 mmol/L 4.3  3.9  4.1   Chloride 98 - 111 mmol/L 108  110  109   CO2 22 - 32 mmol/L '24  22  21   '$ Calcium 8.9 - 10.3 mg/dL 8.2  9.2  9.5   Total Protein 6.5 - 8.1 g/dL 6.8  7.2    Total Bilirubin 0.3 - 1.2 mg/dL 0.5  0.7    Alkaline Phos 38 - 126 U/L 250  183    AST 15 - 41 U/L 38  34    ALT 0 - 44 U/L 35  37      Imaging studies: No new pertinent imaging studies  Assessment/Plan:  56 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   -  New 11/21: No new issues today  - Wound Care: Last change 11/17. [name] pouch with "window" but not on intermittent or continuous suction. Patient seems to like this better. She will need intermittent suction with Yankauer to control effluent. Likely be beneficial to suction before ambulation.   - Continue TPN; cyclic - Appreciate dietary assistance with trace elements; iron supplementation            - Monitor abdominal examination; on-going bowel function            - Pain control prn; antemetic prn            - Therapies back on board; recommending HHPT; continue to follow    - Discharge Planning: Anticipate lengthy admission and potential eventual transfer; no new progress regarding disposition    All of the above findings and recommendations were discussed with the patient, and the medical team, and all of  patient's questions were answered to her expressed satisfaction.  -- [name], PA-C [facility] 07/29/2022, 8:51 AM M-F: 7am - 4pm

## 2022-07-29 NOTE — Progress Notes (Signed)
PT Cancellation Note  Patient Details Name: Debra [name] MRN: [id number] DOB: Jan 10, 1966   Cancelled Treatment:    Reason Eval/Treat Not Completed: Other (comment).  Pt currently off floor for imaging.  Will re-attempt PT session at a later date/time as able.  Leitha Bleak, PT 07/29/22, 10:48 AM

## 2022-07-30 LAB — GLUCOSE, CAPILLARY
Glucose-Capillary: 131 mg/dL — ABNORMAL HIGH (ref 70–99)
Glucose-Capillary: 161 mg/dL — ABNORMAL HIGH (ref 70–99)
Glucose-Capillary: 127 mg/dL — ABNORMAL HIGH (ref 70–99)
Glucose-Capillary: 249 mg/dL — ABNORMAL HIGH (ref 70–99)

## 2022-07-30 MED ORDER — ZINC CHLORIDE 1 MG/ML IV SOLN
INTRAVENOUS | Status: AC
  Filled 2022-07-30: qty 681.33

## 2022-07-30 NOTE — Progress Notes (Signed)
PHARMACY - TOTAL PARENTERAL NUTRITION CONSULT NOTE   Indication: Prolonged ileus  Patient Measurements: Height: '4\' 11"'$  (149.9 cm) Weight: 92.6 kg (204 lb 2.3 oz) IBW/kg (Calculated) : 43.2 TPN AdjBW (KG): 55.2 Body mass index is 41.23 kg/m.  Assessment: Debra [name] is a 56 y.o. female s/p laparotomy, excision of greater omental mass, abdominal wall reconstruction with [name], appendectomy, and placement of Prevena vac.  Glucose / Insulin:  --No apparent history of diabetes --rSSI 4x/day + 20u insulin in TPN --24h BG: 119 - 189 --SSI: 15u last 24h Electrolytes: Within normal limits Renal: Scr returning to baseline ~ 0.5 with discontinuation of Lasix Hepatic: LFTs within normal limits. Alkaline phosphatase elevated GI Imaging: 9/11 CTAP: no new acute issues GI Surgeries / Procedures: s/p laparotomy, excision of greater omental mass, abdominal wall reconstruction with [name], appendectomy, and placement of Prevena vac  Central access: 02/15/22 TPN start date: 02/15/22  Nutritional Goals: Goal cyclic TPN over 16 hrs: cyclic TPN over 16 hrs: (provides 102 g of protein and 1817 kcals per day) total volume over 16hrs for calculations=1400 ml (1500 ml total with overfill)  RD Assessment:  Estimated Needs Total Energy Estimated Needs: 1800-2100kcal/day Total Protein Estimated Needs: 90-110g/d Total Fluid Estimated Needs: 1.4-1.6L/day  Current Nutrition:  Soft diet + nutritional supplements, not meeting PO needs yet   Plan: Transitioned to *Cyclic* TPN on 3/22.   to run over 16 hours per MD request. [name] at 2000 per discussion with Dietician to allow patient time for walking in afternoon/evenings.  To run over 16 hours: -Start rate at 49 mL/hr for 1 hour. -Increase rate to 98 mL/hr for 13 hours.  -Decrease rate to 49 mL/hr for 1 hour. -Decrease rate to 25 mL/hr for 1 hours, then stop.  Plan:  Continue Nutritional Components Amino acids  (using 15% Clinisol): 102 g Dextrose 19% = 266 g Lipids (using 20% SMOFlipids): 50.4 g kCal: 1817/24h  Electrolytes in TPN (standard): Na 60mq/L, K 56m/L, Ca 49m28mL, Mg 10 mEq/L, Phos 10 mmol/L, Cl:Ac 1:1 Add MVI, trace elements, chromium, zinc in TPN Continue CBG/SSI for Cyclic TPN:  -CBG 2 hrs after cyclic TPN start -CBG during middle of cyclic TPN infusion -CBG 1 hr after cylic TPN stopped -CBG while off TPN Continue resistant (=4 CBGs per 24 hr period) SSI + 20u insulin in TPN Check TPN labs on Mon/Thurs at minimum Monitor for further need of diuresis and adjust as needed for fluid status  [name]11/22/2023 7:42 AM

## 2022-07-30 NOTE — Progress Notes (Addendum)
[facility] SURGICAL PROGRESS NOTE (cpt [phone])  Hospital Day(s): 167.   Post op day(s): 165 Days Post-Op.   Interval History:  Patient seen and examined Nausea improved some; intermittently worsens  [name] intact; Managing effluent with intermittent suction; output 370 ccs documented [name] last changed on 11/17 She is on soft diet; on cyclic TPN Ambulating better; PT remains on board; recommending HHPT  Review of Systems:  Constitutional: denies fever, chills  HEENT: denies cough or congestion  Respiratory: denies any shortness of breath  Cardiovascular: denies chest pain or palpitations  Gastrointestinal: denies abdominal pain, N/V Genitourinary: denies burning with urination or urinary frequency Integumentary: + midline wound MSK: + Back Pain   Vital signs in last 24 hours: [min-max] current  Temp:  [97.6 F (36.4 C)-98.2 F (36.8 C)] 97.7 F (36.5 C) (11/22 0301) Pulse Rate:  [75-89] 80 (11/22 0301) Resp:  [16-18] 17 (11/22 0301) BP: (93-121)/(50-69) 99/50 (11/22 0301) SpO2:  [92 %-100 %] 99 % (11/22 0301) Weight:  [92.6 kg] 92.6 kg (11/22 0304)     Height: '4\' 11"'$  (149.9 cm) Weight: 92.6 kg BMI (Calculated): 43.04   Intake/Output last 2 shifts:  11/21 0701 - 11/22 0700 In: -  Out: 2000 [Drains:2000]   Physical Exam:  Constitutional: alert, cooperative and no distress  Respiratory: breathing non-labored at rest  Cardiovascular: regular rate and sinus rhythm  Gastrointestinal: Soft, abdominal soreness on the right, non-distended, no rebound/guarding. Integumentary: Midline wound open, peritoneum closed; granulating; there are three stomatized areas visible in the LUQ and LLQ portions of the wound, output remains feculent. There is hypergranulation tissue to the inferior wound edge.   Labs:     Latest Ref Rng & Units 07/13/2022    7:05 AM 07/02/2022   10:13 AM 06/17/2022    5:03 AM  CBC  WBC 4.0 - 10.5 K/uL 4.5  3.7  2.7   Hemoglobin 12.0 - 15.0  g/dL 9.9  10.2  8.1   Hematocrit 36.0 - 46.0 % 31.1  32.6  25.6   Platelets 150 - 400 K/uL 115  84  69       Latest Ref Rng & Units 07/28/2022    4:41 AM 07/24/2022    5:02 AM 07/23/2022    5:09 AM  CMP  Glucose 70 - 99 mg/dL 169  210  276   BUN 6 - 20 mg/dL 28  66  78   Creatinine 0.44 - 1.00 mg/dL 0.57  0.99  1.25   Sodium 135 - 145 mmol/L 136  139  139   Potassium 3.5 - 5.1 mmol/L 4.3  3.9  4.1   Chloride 98 - 111 mmol/L 108  110  109   CO2 22 - 32 mmol/L '24  22  21   '$ Calcium 8.9 - 10.3 mg/dL 8.2  9.2  9.5   Total Protein 6.5 - 8.1 g/dL 6.8  7.2    Total Bilirubin 0.3 - 1.2 mg/dL 0.5  0.7    Alkaline Phos 38 - 126 U/L 250  183    AST 15 - 41 U/L 38  34    ALT 0 - 44 U/L 35  37      Imaging studies: No new pertinent imaging studies  Assessment/Plan:  56 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   - New 11/22: No new issues; Lumbar XR reviewed with neurosurgery yesterday; appreciate their assistance   - Wound Care: Last change 11/17. [name] pouch with "window" but not on intermittent or continuous suction. Patient seems to like this better. She will need intermittent suction with Yankauer to control effluent. Likely be beneficial to suction before ambulation.   - Continue TPN; cyclic - Appreciate dietary assistance with trace elements; iron supplementation            - Monitor abdominal examination; on-going bowel function            - Pain control prn; antemetic prn            - Therapies back on board; recommending HHPT; continue to follow    - Discharge Planning: Anticipate lengthy admission and potential eventual transfer; no new progress regarding disposition    All of the above findings and  recommendations were discussed with the patient, and the medical team, and all of patient's questions were answered to her expressed satisfaction.  -- Debra Simon, PA-C [facility] 07/30/2022, 7:34 AM M-F: 7am - 4pm

## 2022-07-30 NOTE — Progress Notes (Signed)
Physical Therapy Treatment Patient Details Name: Debra [name] MRN: [id number] DOB: 18-Mar-1966 Today's Date: 07/30/2022   History of Present Illness Pt is a 56 y.o. female with PMH including hypothyroidism, iron deficient anemia, colon cancer.  Here s/p mass removal with hernia repair and appendectomy 6/8, bowel perforation repair 6/10. [name] pouch was initially placed 6/20 and changed on 7/10. Re-evaluation was ordered 11/5 as pt with acute back pain and muscle spasms, not able to get OOB.    PT Comments    Patient received in recliner. Nephew at bedside. Patient is agreeable to PT session. Improved gait distance to > 500 feet with RW and supervision. Slow steady pace. She will continue to benefit from skilled PT to improve strength and endurance.      Recommendations for follow up therapy are one component of a multi-disciplinary discharge planning process, led by the attending physician.  Recommendations may be updated based on patient status, additional functional criteria and insurance authorization.  Follow Up Recommendations  Home health PT Can patient physically be transported by private vehicle: Yes   Assistance Recommended at Discharge Set up Supervision/Assistance  Patient can return home with the following A little help with walking and/or transfers;A little help with bathing/dressing/bathroom;Assist for transportation;Help with stairs or ramp for entrance;Assistance with cooking/housework   Equipment Recommendations  Rolling walker (2 wheels)    Recommendations for Other Services       Precautions / Restrictions Precautions Precaution Comments: PICC line R UE, [name] pouch abdomen, low fall Restrictions Weight Bearing Restrictions: No RUE Weight Bearing: Weight bearing as tolerated Other Position/Activity Restrictions: Abdominal surgery/[name] pouch     Mobility  Bed Mobility               General bed mobility comments: NT, Patient in recliner     Transfers Overall transfer level: Modified independent Equipment used: Rolling walker (2 wheels) Transfers: Sit to/from Stand Sit to Stand: Modified independent (Device/Increase time)                Ambulation/Gait Ambulation/Gait assistance: Supervision Gait Distance (Feet): 500 Feet Assistive device: Rolling walker (2 wheels) Gait Pattern/deviations: Step-through pattern, Decreased step length - right, Decreased step length - left, Decreased stride length Gait velocity: decr     General Gait Details: step through gait pattern; steady with RW use   Stairs             Wheelchair Mobility    Modified Rankin (Stroke Patients Only)       Balance Overall balance assessment: Modified Independent   Sitting balance-Leahy Scale: Normal     Standing balance support: Bilateral upper extremity supported, During functional activity, Reliant on assistive device for balance Standing balance-Leahy Scale: Good Standing balance comment: steady ambulating with RW use                            Cognition Arousal/Alertness: Awake/alert Behavior During Therapy: WFL for tasks assessed/performed Overall Cognitive Status: Within Functional Limits for tasks assessed                                          Exercises      General Comments        Pertinent Vitals/Pain Pain Assessment Pain Assessment: No/denies pain    Home Living  Prior Function            PT Goals (current goals can now be found in the care plan section) Acute Rehab PT Goals Patient Stated Goal: to go home PT Goal Formulation: With patient/family Time For Goal Achievement: 08/06/22 Potential to Achieve Goals: Good Progress towards PT goals: Progressing toward goals    Frequency    Min 2X/week      PT Plan Current plan remains appropriate    Co-evaluation              AM-PAC PT "6 Clicks" Mobility   Outcome  Measure  Help needed turning from your back to your side while in a flat bed without using bedrails?: A Little Help needed moving from lying on your back to sitting on the side of a flat bed without using bedrails?: A Little Help needed moving to and from a bed to a chair (including a wheelchair)?: A Little Help needed standing up from a chair using your arms (e.g., wheelchair or bedside chair)?: None Help needed to walk in hospital room?: None Help needed climbing 3-5 steps with a railing? : A Little 6 Click Score: 20    End of Session   Activity Tolerance: Patient tolerated treatment well Patient left: in chair;with call bell/phone within reach Nurse Communication: Mobility status PT Visit Diagnosis: Muscle weakness (generalized) (M62.81);Difficulty in walking, not elsewhere classified (R26.2)     Time: 1340-1409 PT Time Calculation (min) (ACUTE ONLY): 29 min  Charges:  $Gait Training: 23-37 mins                     Fergie Sherbert, PT, GCS 07/30/22,2:55 PM

## 2022-07-31 LAB — COMPREHENSIVE METABOLIC PANEL
ALT: 45 U/L — ABNORMAL HIGH (ref 0–44)
AST: 48 U/L — ABNORMAL HIGH (ref 15–41)
Albumin: 2.7 g/dL — ABNORMAL LOW (ref 3.5–5.0)
Alkaline Phosphatase: 268 U/L — ABNORMAL HIGH (ref 38–126)
Anion gap: 5 (ref 5–15)
BUN: 26 mg/dL — ABNORMAL HIGH (ref 6–20)
CO2: 22 mmol/L (ref 22–32)
Calcium: 8.6 mg/dL — ABNORMAL LOW (ref 8.9–10.3)
Chloride: 107 mmol/L (ref 98–111)
Creatinine, Ser: 0.53 mg/dL (ref 0.44–1.00)
GFR, Estimated: >60 mL/min (ref >60)
Glucose, Bld: 173 mg/dL — ABNORMAL HIGH (ref 70–99)
Potassium: 4.7 mmol/L (ref 3.5–5.1)
Sodium: 134 mmol/L — ABNORMAL LOW (ref 135–145)
Total Bilirubin: 0.5 mg/dL (ref 0.3–1.2)
Total Protein: 7 g/dL (ref 6.5–8.1)

## 2022-07-31 LAB — GLUCOSE, CAPILLARY
Glucose-Capillary: 178 mg/dL — ABNORMAL HIGH (ref 70–99)
Glucose-Capillary: 189 mg/dL — ABNORMAL HIGH (ref 70–99)
Glucose-Capillary: 190 mg/dL — ABNORMAL HIGH (ref 70–99)
Glucose-Capillary: 77 mg/dL (ref 70–99)
Glucose-Capillary: 73 mg/dL (ref 70–99)
Glucose-Capillary: 153 mg/dL — ABNORMAL HIGH (ref 70–99)
Glucose-Capillary: 172 mg/dL — ABNORMAL HIGH (ref 70–99)

## 2022-07-31 LAB — MAGNESIUM: Magnesium: 2 mg/dL (ref 1.7–2.4)

## 2022-07-31 LAB — PHOSPHORUS: Phosphorus: 2.6 mg/dL (ref 2.5–4.6)

## 2022-07-31 MED ORDER — ZINC CHLORIDE 1 MG/ML IV SOLN
INTRAVENOUS | Status: AC
  Filled 2022-07-31: qty 681.33

## 2022-07-31 NOTE — Progress Notes (Signed)
PHARMACY - TOTAL PARENTERAL NUTRITION CONSULT NOTE   Indication: Prolonged ileus  Patient Measurements: Height: '4\' 11"'$  (149.9 cm) Weight: 92.6 kg (204 lb 2.3 oz) IBW/kg (Calculated) : 43.2 TPN AdjBW (KG): 55.2 Body mass index is 41.23 kg/m.  Assessment: Debra [name] is a 56 y.o. female s/p laparotomy, excision of greater omental mass, abdominal wall reconstruction with [name], appendectomy, and placement of Prevena vac.  Glucose / Insulin:  --No apparent history of diabetes --rSSI 4x/day + 20u insulin in TPN --24h BG: 127 - 249 --SSI: 18u last 24h Electrolytes: Within normal limits Renal: Scr returning to baseline ~ 0.5 with discontinuation of Lasix Hepatic: LFTs trending up slightly GI Imaging: 9/11 CTAP: no new acute issues GI Surgeries / Procedures: s/p laparotomy, excision of greater omental mass, abdominal wall reconstruction with [name], appendectomy, and placement of Prevena vac  Central access: 02/15/22 TPN start date: 02/15/22  Nutritional Goals: Goal cyclic TPN over 16 hrs: cyclic TPN over 16 hrs: (provides 102 g of protein and 1817 kcals per day) total volume over 16hrs for calculations=1400 ml (1500 ml total with overfill)  RD Assessment:  Estimated Needs Total Energy Estimated Needs: 1800-2100kcal/day Total Protein Estimated Needs: 90-110g/d Total Fluid Estimated Needs: 1.4-1.6L/day  Current Nutrition:  Soft diet + nutritional supplements, not meeting PO needs yet   Plan: Transitioned to *Cyclic* TPN on 2/63.   to run over 16 hours per MD request. [name] at 2000 per discussion with Dietician to allow patient time for walking in afternoon/evenings.  To run over 16 hours: -Start rate at 49 mL/hr for 1 hour. -Increase rate to 98 mL/hr for 13 hours.  -Decrease rate to 49 mL/hr for 1 hour. -Decrease rate to 25 mL/hr for 1 hours, then stop.  Plan:  Continue Nutritional Components Amino acids (using 15% Clinisol): 102  g Dextrose 19% = 266 g Lipids (using 20% SMOFlipids): 50.4 g kCal: 1817/24h  Electrolytes in TPN: Na 643mq/L, K 574m/L, Ca 43m26mL, Mg 10 mEq/L, Phos 10 mmol/L, Cl:Ac 1:1 Add MVI, trace elements, chromium, zinc in TPN Continue CBG/SSI for Cyclic TPN:  -CBG 2 hrs after cyclic TPN start -CBG during middle of cyclic TPN infusion -CBG 1 hr after cylic TPN stopped -CBG while off TPN Continue resistant (=4 CBGs per 24 hr period) SSI + 20u insulin in TPN Check TPN labs on Mon/Thurs at minimum Monitor for further need of diuresis and adjust as needed for fluid status  [name]11/23/2023 7:06 AM

## 2022-07-31 NOTE — Progress Notes (Signed)
Subjective:  CC: Debra [name] is a 56 y.o. female  Hospital stay day 168, 166 Days Post-Op s/p re-opening of laparotomy for repair of small bowel perforation following initial laparotomy, excision of greater omental mass, abdominal wall reconstruction with [name], appendectomy, and placement of Prevena vac on 06/08.   HPI: No complaints overnight.  [name]'s pouch replaced overnight due to leaking.  No issues this a.m.  ROS:  General: Denies weight loss, weight gain, fatigue, fevers, chills, and night sweats. Heart: Denies chest pain, palpitations, racing heart, irregular heartbeat, leg pain or swelling, and decreased activity tolerance. Respiratory: Denies breathing difficulty, shortness of breath, wheezing, cough, and sputum. GI: Denies change in appetite, heartburn, nausea, vomiting, constipation, diarrhea, and blood in stool. GU: Denies difficulty urinating, pain with urinating, urgency, frequency, blood in urine.   Objective:   Temp:  [97.9 F (36.6 C)-98.3 F (36.8 C)] 97.9 F (36.6 C) (11/23 0847) Pulse Rate:  [79-85] 79 (11/23 0847) Resp:  [16-18] 16 (11/23 0847) BP: (97-115)/(60-74) 104/60 (11/23 0847) SpO2:  [99 %-100 %] 100 % (11/23 0847)     Height: '4\' 11"'$  (149.9 cm) Weight: 92.6 kg BMI (Calculated): 43.04   Intake/Output this shift:   Intake/Output Summary (Last 24 hours) at 07/31/2022 1149 Last data filed at 07/31/2022 0440 Gross per 24 hour  Intake --  Output 600 ml  Net -600 ml    Constitutional :  alert, cooperative, appears stated age, and no distress  Respiratory:  clear to auscultation bilaterally  Cardiovascular:  regular rate and rhythm  Gastrointestinal: soft, non-tender; bowel sounds normal; no masses,  no organomegaly.   Skin: Cool and moist.  [name]'s pouch intact over midline abdominal wound.  Wound base remains with no evidence of active bleeding, good granulation tissue.  Psychiatric: Normal affect, non-agitated, not confused        LABS:     Latest Ref Rng & Units 07/31/2022    4:23 AM 07/28/2022    4:41 AM 07/24/2022    5:02 AM  CMP  Glucose 70 - 99 mg/dL 173  169  210   BUN 6 - 20 mg/dL 26  28  66   Creatinine 0.44 - 1.00 mg/dL 0.53  0.57  0.99   Sodium 135 - 145 mmol/L 134  136  139   Potassium 3.5 - 5.1 mmol/L 4.7  4.3  3.9   Chloride 98 - 111 mmol/L 107  108  110   CO2 22 - 32 mmol/L '22  24  22   '$ Calcium 8.9 - 10.3 mg/dL 8.6  8.2  9.2   Total Protein 6.5 - 8.1 g/dL 7.0  6.8  7.2   Total Bilirubin 0.3 - 1.2 mg/dL 0.5  0.5  0.7   Alkaline Phos 38 - 126 U/L 268  250  183   AST 15 - 41 U/L 48  38  34   ALT 0 - 44 U/L 45  35  37       Latest Ref Rng & Units 07/13/2022    7:05 AM 07/02/2022   10:13 AM 06/17/2022    5:03 AM  CBC  WBC 4.0 - 10.5 K/uL 4.5  3.7  2.7   Hemoglobin 12.0 - 15.0 g/dL 9.9  10.2  8.1   Hematocrit 36.0 - 46.0 % 31.1  32.6  25.6   Platelets 150 - 400 K/uL 115  84  69     RADS: N/a Assessment:   s/p re-opening of laparotomy for repair of  small bowel perforation following initial laparotomy, excision of greater omental mass, abdominal wall reconstruction with [name], appendectomy, and placement of Prevena vac on 06/08.  No complaint of worsening pain or nausea this a.m.  LFTs did increase slightly.  Will repeat tomorrow and continue to monitor for now since asymptomatic.  labs/images/medications/previous chart entries reviewed personally and relevant changes/updates noted above.

## 2022-08-01 LAB — CBC
HCT: 25.7 % — ABNORMAL LOW (ref 36.0–46.0)
Hemoglobin: 8.4 g/dL — ABNORMAL LOW (ref 12.0–15.0)
MCH: 28.2 pg (ref 26.0–34.0)
MCHC: 32.7 g/dL (ref 30.0–36.0)
MCV: 86.2 fL (ref 80.0–100.0)
Platelets: 137 10*3/uL — ABNORMAL LOW (ref 150–400)
RBC: 2.98 MIL/uL — ABNORMAL LOW (ref 3.87–5.11)
RDW: 17.1 % — ABNORMAL HIGH (ref 11.5–15.5)
WBC: 5.7 10*3/uL (ref 4.0–10.5)
nRBC: 0 % (ref 0.0–0.2)

## 2022-08-01 LAB — HEPATIC FUNCTION PANEL
ALT: 40 U/L (ref 0–44)
AST: 37 U/L (ref 15–41)
Albumin: 2.7 g/dL — ABNORMAL LOW (ref 3.5–5.0)
Alkaline Phosphatase: 249 U/L — ABNORMAL HIGH (ref 38–126)
Bilirubin, Direct: 0.1 mg/dL (ref 0.0–0.2)
Indirect Bilirubin: 0.3 mg/dL (ref 0.3–0.9)
Total Bilirubin: 0.4 mg/dL (ref 0.3–1.2)
Total Protein: 6.9 g/dL (ref 6.5–8.1)

## 2022-08-01 LAB — GLUCOSE, CAPILLARY
Glucose-Capillary: 173 mg/dL — ABNORMAL HIGH (ref 70–99)
Glucose-Capillary: 227 mg/dL — ABNORMAL HIGH (ref 70–99)
Glucose-Capillary: 94 mg/dL (ref 70–99)
Glucose-Capillary: 176 mg/dL — ABNORMAL HIGH (ref 70–99)

## 2022-08-01 MED ORDER — ZINC CHLORIDE 1 MG/ML IV SOLN
INTRAVENOUS | Status: AC
  Filled 2022-08-01: qty 681.33

## 2022-08-01 NOTE — Progress Notes (Signed)
PHARMACY - TOTAL PARENTERAL NUTRITION CONSULT NOTE   Indication: Prolonged ileus  Patient Measurements: Height: '4\' 11"'$  (149.9 cm) Weight: 92.7 kg (204 lb 5.9 oz) IBW/kg (Calculated) : 43.2 TPN AdjBW (KG): 55.2 Body mass index is 41.28 kg/m.  Assessment: Debra [name] is a 56 y.o. female s/p laparotomy, excision of greater omental mass, abdominal wall reconstruction with [name], appendectomy, and placement of Prevena vac.  Glucose / Insulin:  --No apparent history of diabetes --rSSI 4x/day + 20u insulin in TPN --24h BG: 73 - 153 --SSI: 11u last 24h Electrolytes: Within normal limits Renal: Scr at approximate baseline  Hepatic: LFTs trending up slightly GI Imaging: 9/11 CTAP: no new acute issues GI Surgeries / Procedures: s/p laparotomy, excision of greater omental mass, abdominal wall reconstruction with [name], appendectomy, and placement of Prevena vac  Central access: 02/15/22 TPN start date: 02/15/22  Nutritional Goals: Goal cyclic TPN over 16 hrs: cyclic TPN over 16 hrs: (provides 102 g of protein and 1817 kcals per day) total volume over 16hrs for calculations=1400 ml (1500 ml total with overfill)  RD Assessment:  Estimated Needs Total Energy Estimated Needs: 1800-2100kcal/day Total Protein Estimated Needs: 90-110g/d Total Fluid Estimated Needs: 1.4-1.6L/day  Current Nutrition:  Soft diet + nutritional supplements, not meeting PO needs yet   Plan: Transitioned to *Cyclic* TPN on 6/01.   to run over 16 hours per MD request. [name] at 2000 per discussion with Dietician to allow patient time for walking in afternoon/evenings.  To run over 16 hours: -Start rate at 49 mL/hr for 1 hour. -Increase rate to 98 mL/hr for 13 hours.  -Decrease rate to 49 mL/hr for 1 hour. -Decrease rate to 25 mL/hr for 1 hours, then stop.  Plan:  Continue Nutritional Components Amino acids (using 15% Clinisol): 102 g Dextrose 19% = 266 g Lipids (using  20% SMOFlipids): 50.4 g kCal: 1817/24h  Electrolytes in TPN: Na 36mq/L, K 513m/L, Ca 21m67mL, Mg 10 mEq/L, Phos 10 mmol/L, Cl:Ac 1:1 Add MVI, trace elements, chromium, zinc in TPN Continue CBG/SSI for Cyclic TPN:  -CBG 2 hrs after cyclic TPN start -CBG during middle of cyclic TPN infusion -CBG 1 hr after cylic TPN stopped -CBG while off TPN Continue resistant (=4 CBGs per 24 hr period) SSI + 15u insulin in TPN Check TPN labs on Mon/Thurs at minimum Monitor for further need of diuresis and adjust as needed for fluid status  [name]11/24/2023 7:42 AM

## 2022-08-01 NOTE — Plan of Care (Signed)
  Problem: Clinical Measurements: Goal: Will remain free from infection Outcome: Progressing   Problem: Nutrition: Goal: Adequate nutrition will be maintained Outcome: Progressing   Problem: Activity: Goal: Risk for activity intolerance will decrease Outcome: Progressing

## 2022-08-01 NOTE — Progress Notes (Signed)
Subjective:  CC: Debra [name] is a 56 y.o. female  Hospital stay day 169, 167 Days Post-Op s/p re-opening of laparotomy for repair of small bowel perforation following initial laparotomy, excision of greater omental mass, abdominal wall reconstruction with [name], appendectomy, and placement of Prevena vac on 06/08.   HPI: No complaints overnight.  [name]'s pouch no issues this a.m.  ROS:  General: Denies weight loss, weight gain, fatigue, fevers, chills, and night sweats. Heart: Denies chest pain, palpitations, racing heart, irregular heartbeat, leg pain or swelling, and decreased activity tolerance. Respiratory: Denies breathing difficulty, shortness of breath, wheezing, cough, and sputum. GI: Denies change in appetite, heartburn, nausea, vomiting, constipation, diarrhea, and blood in stool. GU: Denies difficulty urinating, pain with urinating, urgency, frequency, blood in urine.   Objective:   Temp:  [97.7 F (36.5 C)-98.1 F (36.7 C)] 97.7 F (36.5 C) (11/24 0449) Pulse Rate:  [72-81] 75 (11/24 0449) Resp:  [16-18] 18 (11/24 0449) BP: (99-107)/(53-61) 99/53 (11/24 0449) SpO2:  [100 %] 100 % (11/24 0449) Weight:  [92.7 kg] 92.7 kg (11/24 0500)     Height: '4\' 11"'$  (149.9 cm) Weight: 92.7 kg BMI (Calculated): 43.04   Intake/Output this shift:   Intake/Output Summary (Last 24 hours) at 08/01/2022 0836 Last data filed at 08/01/2022 0541 Gross per 24 hour  Intake 230.34 ml  Output --  Net 230.34 ml     Constitutional :  alert, cooperative, appears stated age, and no distress  Respiratory:  clear to auscultation bilaterally  Cardiovascular:  regular rate and rhythm  Gastrointestinal: soft, non-tender; bowel sounds normal; no masses,  no organomegaly.   Skin: Cool and moist.  [name]'s pouch intact over midline abdominal wound.  Wound base remains with no evidence of active bleeding, good granulation tissue.  Psychiatric: Normal affect, non-agitated, not  confused       LABS:     Latest Ref Rng & Units 08/01/2022    4:29 AM 07/31/2022    4:23 AM 07/28/2022    4:41 AM  CMP  Glucose 70 - 99 mg/dL  173  169   BUN 6 - 20 mg/dL  26  28   Creatinine 0.44 - 1.00 mg/dL  0.53  0.57   Sodium 135 - 145 mmol/L  134  136   Potassium 3.5 - 5.1 mmol/L  4.7  4.3   Chloride 98 - 111 mmol/L  107  108   CO2 22 - 32 mmol/L  22  24   Calcium 8.9 - 10.3 mg/dL  8.6  8.2   Total Protein 6.5 - 8.1 g/dL 6.9  7.0  6.8   Total Bilirubin 0.3 - 1.2 mg/dL 0.4  0.5  0.5   Alkaline Phos 38 - 126 U/L 249  268  250   AST 15 - 41 U/L 37  48  38   ALT 0 - 44 U/L 40  45  35       Latest Ref Rng & Units 08/01/2022    4:29 AM 07/13/2022    7:05 AM 07/02/2022   10:13 AM  CBC  WBC 4.0 - 10.5 K/uL 5.7  4.5  3.7   Hemoglobin 12.0 - 15.0 g/dL 8.4  9.9  10.2   Hematocrit 36.0 - 46.0 % 25.7  31.1  32.6   Platelets 150 - 400 K/uL 137  115  84     RADS: N/a Assessment:   s/p re-opening of laparotomy for repair of small bowel perforation following initial laparotomy,  excision of greater omental mass, abdominal wall reconstruction with [name], appendectomy, and placement of Prevena vac on 06/08.  No complaint of worsening pain or nausea this a.m.; transaminases back to normal.    labs/images/medications/previous chart entries reviewed personally and relevant changes/updates noted above.

## 2022-08-02 LAB — GLUCOSE, CAPILLARY
Glucose-Capillary: 164 mg/dL — ABNORMAL HIGH (ref 70–99)
Glucose-Capillary: 178 mg/dL — ABNORMAL HIGH (ref 70–99)
Glucose-Capillary: 121 mg/dL — ABNORMAL HIGH (ref 70–99)
Glucose-Capillary: 153 mg/dL — ABNORMAL HIGH (ref 70–99)

## 2022-08-02 MED ORDER — ZINC CHLORIDE 1 MG/ML IV SOLN
INTRAVENOUS | Status: AC
  Filled 2022-08-02: qty 681.33

## 2022-08-02 NOTE — Plan of Care (Signed)
  Problem: Activity: Goal: Risk for activity intolerance will decrease Outcome: Progressing   Problem: Pain Managment: Goal: General experience of comfort will improve Outcome: Progressing   

## 2022-08-02 NOTE — Progress Notes (Signed)
PHARMACY - TOTAL PARENTERAL NUTRITION CONSULT NOTE   Indication: Prolonged ileus  Patient Measurements: Height: '4\' 11"'$  (149.9 cm) Weight: 94 kg (207 lb 3.7 oz) IBW/kg (Calculated) : 43.2 TPN AdjBW (KG): 55.2 Body mass index is 41.86 kg/m.  Assessment: Debra [name] is a 56 y.o. female s/p laparotomy, excision of greater omental mass, abdominal wall reconstruction with [name], appendectomy, and placement of Prevena vac.  Glucose / Insulin:  --No apparent history of diabetes --rSSI 4x/day + 15u insulin in TPN --24h BG: 94 - 227 --SSI: 15u last 24h Electrolytes: Within normal limits Renal: Scr at approximate baseline  Hepatic: LFTs trending up slightly GI Imaging: 9/11 CTAP: no new acute issues GI Surgeries / Procedures: s/p laparotomy, excision of greater omental mass, abdominal wall reconstruction with [name], appendectomy, and placement of Prevena vac  Central access: 02/15/22 TPN start date: 02/15/22  Nutritional Goals: Goal cyclic TPN over 16 hrs: cyclic TPN over 16 hrs: (provides 102 g of protein and 1817 kcals per day) total volume over 16hrs for calculations=1400 ml (1500 ml total with overfill)  RD Assessment:  Estimated Needs Total Energy Estimated Needs: 1800-2100kcal/day Total Protein Estimated Needs: 90-110g/d Total Fluid Estimated Needs: 1.4-1.6L/day  Current Nutrition:  Soft diet + nutritional supplements, not meeting PO needs yet   Plan: Transitioned to *Cyclic* TPN on 1/96.   to run over 16 hours per MD request. [name] at 2000 per discussion with Dietician to allow patient time for walking in afternoon/evenings.  To run over 16 hours: -Start rate at 49 mL/hr for 1 hour. -Increase rate to 98 mL/hr for 13 hours.  -Decrease rate to 49 mL/hr for 1 hour. -Decrease rate to 25 mL/hr for 1 hours, then stop.  Plan:  Continue Nutritional Components Amino acids (using 15% Clinisol): 102 g Dextrose 19% = 266 g Lipids (using 20%  SMOFlipids): 50.4 g kCal: 1817/24h  Electrolytes in TPN: Na 69mq/L, K 591m/L, Ca 38m18mL, Mg 10 mEq/L, Phos 10 mmol/L, Cl:Ac 1:1 Add MVI, trace elements, chromium, zinc in TPN Continue CBG/SSI for Cyclic TPN:  -CBG 2 hrs after cyclic TPN start -CBG during middle of cyclic TPN infusion -CBG 1 hr after cylic TPN stopped -CBG while off TPN Continue resistant (=4 CBGs per 24 hr period) SSI + 20u insulin in TPN Check TPN labs on Mon/Thurs at minimum Monitor for further need of diuresis and adjust as needed for fluid status  [name]11/25/2023 7:42 AM

## 2022-08-02 NOTE — Progress Notes (Signed)
Subjective:  CC: Debra [name] is a 56 y.o. female  Hospital stay day 170, 168 Days Post-Op s/p re-opening of laparotomy for repair of small bowel perforation following initial laparotomy, excision of greater omental mass, abdominal wall reconstruction with [name], appendectomy, and placement of Prevena vac on 06/08.   HPI: No complaints overnight.  [name]'s pouch no issues this a.m.    Objective:   Temp:  [98 F (36.7 C)-98.2 F (36.8 C)] 98.1 F (36.7 C) (11/24 1943) Pulse Rate:  [71-86] 80 (11/24 1943) Resp:  [17-18] 17 (11/24 1943) BP: (96-116)/(62-70) 113/70 (11/24 1943) SpO2:  [100 %] 100 % (11/24 1943) Weight:  [94 kg] 94 kg (11/25 0453)     Height: '4\' 11"'$  (149.9 cm) Weight: 94 kg BMI (Calculated): 43.04   Intake/Output this shift:   Intake/Output Summary (Last 24 hours) at 08/02/2022 0805 Last data filed at 08/02/2022 0757 Gross per 24 hour  Intake 1101.57 ml  Output 270 ml  Net 831.57 ml     Constitutional :  alert, cooperative, appears stated age, and no distress  Respiratory:  clear to auscultation bilaterally  Cardiovascular:  regular rate and rhythm  Gastrointestinal: soft, non-tender; bowel sounds normal; no masses,  no organomegaly.   Skin: Cool and moist.  [name]'s pouch intact over midline abdominal wound.  Wound base remains with no evidence of active bleeding, good granulation tissue.  Psychiatric: Normal affect, non-agitated, not confused       LABS:     Latest Ref Rng & Units 08/01/2022    4:29 AM 07/31/2022    4:23 AM 07/28/2022    4:41 AM  CMP  Glucose 70 - 99 mg/dL  173  169   BUN 6 - 20 mg/dL  26  28   Creatinine 0.44 - 1.00 mg/dL  0.53  0.57   Sodium 135 - 145 mmol/L  134  136   Potassium 3.5 - 5.1 mmol/L  4.7  4.3   Chloride 98 - 111 mmol/L  107  108   CO2 22 - 32 mmol/L  22  24   Calcium 8.9 - 10.3 mg/dL  8.6  8.2   Total Protein 6.5 - 8.1 g/dL 6.9  7.0  6.8   Total Bilirubin 0.3 - 1.2 mg/dL 0.4  0.5  0.5    Alkaline Phos 38 - 126 U/L 249  268  250   AST 15 - 41 U/L 37  48  38   ALT 0 - 44 U/L 40  45  35       Latest Ref Rng & Units 08/01/2022    4:29 AM 07/13/2022    7:05 AM 07/02/2022   10:13 AM  CBC  WBC 4.0 - 10.5 K/uL 5.7  4.5  3.7   Hemoglobin 12.0 - 15.0 g/dL 8.4  9.9  10.2   Hematocrit 36.0 - 46.0 % 25.7  31.1  32.6   Platelets 150 - 400 K/uL 137  115  84     RADS: N/a Assessment:   s/p re-opening of laparotomy for repair of small bowel perforation following initial laparotomy, excision of greater omental mass, abdominal wall reconstruction with [name], appendectomy, and placement of Prevena vac on 06/08.  No complaint of worsening pain or nausea this a.m.  labs/images/medications/previous chart entries reviewed personally and relevant changes/updates noted above.

## 2022-08-03 LAB — GLUCOSE, CAPILLARY
Glucose-Capillary: 190 mg/dL — ABNORMAL HIGH (ref 70–99)
Glucose-Capillary: 163 mg/dL — ABNORMAL HIGH (ref 70–99)
Glucose-Capillary: 78 mg/dL (ref 70–99)
Glucose-Capillary: 204 mg/dL — ABNORMAL HIGH (ref 70–99)

## 2022-08-03 MED ORDER — ZINC CHLORIDE 1 MG/ML IV SOLN
INTRAVENOUS | Status: AC
  Filled 2022-08-03: qty 681.33

## 2022-08-03 NOTE — Progress Notes (Signed)
PHARMACY - TOTAL PARENTERAL NUTRITION CONSULT NOTE   Indication: Prolonged ileus  Patient Measurements: Height: '4\' 11"'$  (149.9 cm) Weight: 92.3 kg (203 lb 7.8 oz) IBW/kg (Calculated) : 43.2 TPN AdjBW (KG): 55.2 Body mass index is 41.1 kg/m.  Assessment: Debra [name] is a 56 y.o. female s/p laparotomy, excision of greater omental mass, abdominal wall reconstruction with [name], appendectomy, and placement of Prevena vac.  Glucose / Insulin:  --No apparent history of diabetes --rSSI 4x/day + 20u insulin in TPN --24h BG: 121 - 190 --SSI: 15u last 24h Electrolytes: Within normal limits Renal: Scr at approximate baseline  Hepatic: LFTs trending up slightly GI Imaging: 9/11 CTAP: no new acute issues GI Surgeries / Procedures: s/p laparotomy, excision of greater omental mass, abdominal wall reconstruction with [name], appendectomy, and placement of Prevena vac  Central access: 02/15/22 TPN start date: 02/15/22  Nutritional Goals: Goal cyclic TPN over 16 hrs: cyclic TPN over 16 hrs: (provides 102 g of protein and 1817 kcals per day) total volume over 16hrs for calculations=1400 ml (1500 ml total with overfill)  RD Assessment:  Estimated Needs Total Energy Estimated Needs: 1800-2100kcal/day Total Protein Estimated Needs: 90-110g/d Total Fluid Estimated Needs: 1.4-1.6L/day  Current Nutrition:  Soft diet + nutritional supplements, not meeting PO needs yet   Plan: Transitioned to *Cyclic* TPN on 2/42.   to run over 16 hours per MD request. [name] at 2000 per discussion with Dietician to allow patient time for walking in afternoon/evenings.  To run over 16 hours: -Start rate at 49 mL/hr for 1 hour. -Increase rate to 98 mL/hr for 13 hours.  -Decrease rate to 49 mL/hr for 1 hour. -Decrease rate to 25 mL/hr for 1 hours, then stop.  Plan:  Continue Nutritional Components Amino acids (using 15% Clinisol): 102 g Dextrose 19% = 266 g Lipids (using  20% SMOFlipids): 50.4 g kCal: 1817/24h  Electrolytes in TPN: Na 72mq/L, K 5366m/L, Ca 66m16mL, Mg 10 mEq/L, Phos 10 mmol/L, Cl:Ac 1:1 Add MVI, trace elements, chromium, zinc in TPN Continue CBG/SSI for Cyclic TPN:  -CBG 2 hrs after cyclic TPN start -CBG during middle of cyclic TPN infusion -CBG 1 hr after cylic TPN stopped -CBG while off TPN Continue resistant (=4 CBGs per 24 hr period) SSI + 20u insulin in TPN Check TPN labs on Mon/Thurs at minimum Monitor for further need of diuresis and adjust as needed for fluid status  [name]11/26/2023 6:40 AM

## 2022-08-03 NOTE — Progress Notes (Signed)
PT Cancellation Note  Patient Details Name: Debra [name] MRN: [id number] DOB: 04/09/1966   Cancelled Treatment:    Reason Eval/Treat Not Completed: Other (comment)  Pt sleeping soundly in bed.  Unit secretary stated pt has been up and walking independently on unit this am.  Will defer session since pt has been up moving today on her won.  Will return at a later time/date.   Chesley Noon 08/03/2022, 2:14 PM

## 2022-08-03 NOTE — Progress Notes (Signed)
Subjective:  CC: Debra [name] is a 56 y.o. female  Hospital stay day 171, 169 Days Post-Op s/p re-opening of laparotomy for repair of small bowel perforation following initial laparotomy, excision of greater omental mass, abdominal wall reconstruction with [name], appendectomy, and placement of Prevena vac on 06/08.   HPI: No complaints overnight.  [name]'s pouch no issues this a.m.    Objective:   Temp:  [97.8 F (36.6 C)-98.3 F (36.8 C)] 97.8 F (36.6 C) (11/26 1551) Pulse Rate:  [71-80] 71 (11/26 1551) Resp:  [18] 18 (11/26 0457) BP: (94-105)/(49-62) 96/54 (11/26 1551) SpO2:  [89 %-100 %] 98 % (11/26 1551) Weight:  [92.3 kg] 92.3 kg (11/26 0450)     Height: '4\' 11"'$  (149.9 cm) Weight: 92.3 kg BMI (Calculated): 43.04   Intake/Output this shift:   Intake/Output Summary (Last 24 hours) at 08/03/2022 1910 Last data filed at 08/03/2022 1426 Gross per 24 hour  Intake 1951.3 ml  Output 500 ml  Net 1451.3 ml    Constitutional :  alert, cooperative, appears stated age, and no distress  Respiratory:  clear to auscultation bilaterally  Cardiovascular:  regular rate and rhythm  Gastrointestinal: soft, non-tender; bowel sounds normal; no masses,  no organomegaly.   Skin: Cool and moist.  [name]'s pouch intact over midline abdominal wound.  Wound base remains with no evidence of active bleeding, good granulation tissue.  Psychiatric: Normal affect, non-agitated, not confused       LABS:     Latest Ref Rng & Units 08/01/2022    4:29 AM 07/31/2022    4:23 AM 07/28/2022    4:41 AM  CMP  Glucose 70 - 99 mg/dL  173  169   BUN 6 - 20 mg/dL  26  28   Creatinine 0.44 - 1.00 mg/dL  0.53  0.57   Sodium 135 - 145 mmol/L  134  136   Potassium 3.5 - 5.1 mmol/L  4.7  4.3   Chloride 98 - 111 mmol/L  107  108   CO2 22 - 32 mmol/L  22  24   Calcium 8.9 - 10.3 mg/dL  8.6  8.2   Total Protein 6.5 - 8.1 g/dL 6.9  7.0  6.8   Total Bilirubin 0.3 - 1.2 mg/dL 0.4  0.5  0.5    Alkaline Phos 38 - 126 U/L 249  268  250   AST 15 - 41 U/L 37  48  38   ALT 0 - 44 U/L 40  45  35       Latest Ref Rng & Units 08/01/2022    4:29 AM 07/13/2022    7:05 AM 07/02/2022   10:13 AM  CBC  WBC 4.0 - 10.5 K/uL 5.7  4.5  3.7   Hemoglobin 12.0 - 15.0 g/dL 8.4  9.9  10.2   Hematocrit 36.0 - 46.0 % 25.7  31.1  32.6   Platelets 150 - 400 K/uL 137  115  84     RADS: N/a Assessment:   s/p re-opening of laparotomy for repair of small bowel perforation following initial laparotomy, excision of greater omental mass, abdominal wall reconstruction with [name], appendectomy, and placement of Prevena vac on 06/08.  No complaint of worsening pain or nausea today  labs/images/medications/previous chart entries reviewed personally and relevant changes/updates noted above.

## 2022-08-04 ENCOUNTER — Encounter: Payer: Self-pay | Admitting: Oncology

## 2022-08-04 LAB — MAGNESIUM: Magnesium: 1.8 mg/dL (ref 1.7–2.4)

## 2022-08-04 LAB — COMPREHENSIVE METABOLIC PANEL
ALT: 48 U/L — ABNORMAL HIGH (ref 0–44)
AST: 52 U/L — ABNORMAL HIGH (ref 15–41)
Albumin: 2.7 g/dL — ABNORMAL LOW (ref 3.5–5.0)
Alkaline Phosphatase: 283 U/L — ABNORMAL HIGH (ref 38–126)
Anion gap: 5 (ref 5–15)
BUN: 25 mg/dL — ABNORMAL HIGH (ref 6–20)
CO2: 23 mmol/L (ref 22–32)
Calcium: 8.6 mg/dL — ABNORMAL LOW (ref 8.9–10.3)
Chloride: 104 mmol/L (ref 98–111)
Creatinine, Ser: 0.48 mg/dL (ref 0.44–1.00)
GFR, Estimated: >60 mL/min (ref >60)
Glucose, Bld: 157 mg/dL — ABNORMAL HIGH (ref 70–99)
Potassium: 4.7 mmol/L (ref 3.5–5.1)
Sodium: 132 mmol/L — ABNORMAL LOW (ref 135–145)
Total Bilirubin: 0.5 mg/dL (ref 0.3–1.2)
Total Protein: 6.9 g/dL (ref 6.5–8.1)

## 2022-08-04 LAB — GLUCOSE, CAPILLARY
Glucose-Capillary: 155 mg/dL — ABNORMAL HIGH (ref 70–99)
Glucose-Capillary: 127 mg/dL — ABNORMAL HIGH (ref 70–99)
Glucose-Capillary: 77 mg/dL (ref 70–99)
Glucose-Capillary: 131 mg/dL — ABNORMAL HIGH (ref 70–99)

## 2022-08-04 LAB — PHOSPHORUS: Phosphorus: 2.6 mg/dL (ref 2.5–4.6)

## 2022-08-04 MED ORDER — MAGNESIUM SULFATE 2 GM/50ML IV SOLN
2.0000 g | Freq: Once | INTRAVENOUS | Status: AC
  Administered 2022-08-04: 2 g via INTRAVENOUS
  Filled 2022-08-04: qty 50

## 2022-08-04 MED ORDER — ZINC CHLORIDE 1 MG/ML IV SOLN
INTRAVENOUS | Status: AC
  Filled 2022-08-04: qty 681.33

## 2022-08-04 NOTE — Progress Notes (Addendum)
PHARMACY - TOTAL PARENTERAL NUTRITION CONSULT NOTE   Indication: Prolonged ileus  Patient Measurements: Height: '4\' 11"'$  (149.9 cm) Weight: 93.5 kg (206 lb 2.1 oz) IBW/kg (Calculated) : 43.2 TPN AdjBW (KG): 55.2 Body mass index is 41.63 kg/m.  Assessment: Debra [name] is a 56 y.o. female s/p laparotomy, excision of greater omental mass, abdominal wall reconstruction with [name], appendectomy, and placement of Prevena vac.  Glucose / Insulin:  --No apparent history of diabetes --rSSI 4x/day + 20u insulin in TPN --24h BG: 155 - 157 --SSI: 15u last 24h Electrolytes: Within normal limits Renal: Scr at approximate baseline  Hepatic: LFTs trending up slightly GI Imaging: 9/11 CTAP: no new acute issues GI Surgeries / Procedures: s/p laparotomy, excision of greater omental mass, abdominal wall reconstruction with [name], appendectomy, and placement of Prevena vac  Central access: 02/15/22 TPN start date: 02/15/22  Nutritional Goals: Goal cyclic TPN over 16 hrs: cyclic TPN over 16 hrs: (provides 102 g of protein and 1817 kcals per day) total volume over 16hrs for calculations=1400 ml (1500 ml total with overfill)  RD Assessment:  Estimated Needs Total Energy Estimated Needs: 1800-2100kcal/day Total Protein Estimated Needs: 90-110g/d Total Fluid Estimated Needs: 1.4-1.6L/day  Current Nutrition:  Soft diet + nutritional supplements, not meeting PO needs yet   Plan: Transitioned to *Cyclic* TPN on 6/30.   to run over 16 hours per MD request. [name] at 2000 per discussion with Dietician to allow patient time for walking in afternoon/evenings.  To run over 16 hours: -Start rate at 49 mL/hr for 1 hour. -Increase rate to 98 mL/hr for 13 hours.  -Decrease rate to 49 mL/hr for 1 hour. -Decrease rate to 25 mL/hr for 1 hours, then stop.  Plan:  Give Magnesium sulfate 2g IV x1 Continue Nutritional Components Amino acids (using 15% Clinisol): 102  g Dextrose 19% = 266 g Lipids (using 20% SMOFlipids): 50.4 g kCal: 1817/24h  Electrolytes in TPN: Na 69mq/L, K 522m/L, Ca 72m83mL, Mg 10 mEq/L, Phos 10 mmol/L, Cl:Ac 1:1 Add MVI, trace elements, chromium, zinc in TPN Continue CBG/SSI for Cyclic TPN:  -CBG 2 hrs after cyclic TPN start -CBG during middle of cyclic TPN infusion -CBG 1 hr after cylic TPN stopped -CBG while off TPN Continue resistant (=4 CBGs per 24 hr period) SSI + 20u insulin in TPN Check TPN labs on Mon/Thurs at minimum Monitor for further need of diuresis and adjust as needed for fluid status  AndDarrick Penna11/27/2023 7:50 AM

## 2022-08-04 NOTE — Progress Notes (Signed)
PT Cancellation Note  Patient Details Name: Sinclair Arrazola MRN: [id number] DOB: 1966/05/28   Cancelled Treatment:    Reason Eval/Treat Not Completed: Other (comment). Arrived to pt room and pt seated in chair. Agreeable to session, however once standing, fluid leaking around wound. RN and CNA called for assistance. Also called wound nurse. Unable to ambulate patient at this time until addressed. Will re-attempt.   Caeden Foots 08/04/2022, 10:00 AM Greggory Stallion, PT, DPT, GCS [phone]

## 2022-08-04 NOTE — TOC Progression Note (Signed)
Transition of Care [facility]) - Progression Note    Patient Details  Name: Debra [name] MRN: [id number] Date of Birth: [birth year]  Transition of Care [facility]) CM/SW Contact  Ross Ludwig, [place] Phone Number: 08/04/2022, 3:14 PM  Clinical Narrative:     Damaris Schooner to Sharyn Lull in registration regarding patient's Medicaid.  Per Sharyn Lull, patient had Medicaid which was effective from 02/06/2022 to 07/08/2022, [id number] N.  Per Sharyn Lull, patient's Medicaid is under under "verification in process," now.  TOC notified DTP team.  Expected Discharge Plan: [place] Barriers to Discharge: Continued Medical Work up  Expected Discharge Plan and Services Expected Discharge Plan: [place]                                               Social Determinants of Health (SDOH) Interventions    Readmission Risk Interventions     No data to display

## 2022-08-04 NOTE — Consult Note (Addendum)
[facility] Nurse wound follow up Current [name] pouch is leaking behind the barrier at the lower edge when OOB with physical therapy.  Applied new large [name] pouch with window and bedside nurses will apply intermittent suction using a Yankour periodically to attempt to avoid leakage.  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 Red macerated skin to wound edges where previous pouch had leaked, approx 2 cm surrounding lower edges.  Instructions have been provided for bedside nurses to change PRN when leaking. 2 large [name] pouches are at the bedside for staff nurse's use. Thank-you,  Julien Girt MSN, [place], [place], [place], [place]

## 2022-08-04 NOTE — Progress Notes (Signed)
Physical Therapy Treatment Patient Details Name: Debra [name] MRN: [id number] DOB: Nov 21, 1965 Today's Date: 08/04/2022   History of Present Illness Pt is a 56 y.o. female with PMH including hypothyroidism, iron deficient anemia, colon cancer.  Here s/p mass removal with hernia repair and appendectomy 6/8, bowel perforation repair 6/10. [name] pouch was initially placed 6/20 and changed on 7/10. Re-evaluation was ordered 11/5 as pt with acute back pain and muscle spasms, not able to get OOB.    PT Comments    Treatment divided into 2 sessions this date as Pt [name] pouch was leaking, needed to be addressed with wound care during initial session. Multiple sit<>Stands performed with RW to assess the leak. On 2nd attempt, further ambulation performed in hallway. Pt reports minimal back pain and appears to be at baseline level. Will sign off from caseload and encouraged to continue to mobility with mobility specialist group in addition to RN staff. Please re-order if needs change.   Recommendations for follow up therapy are one component of a multi-disciplinary discharge planning process, led by the attending physician.  Recommendations may be updated based on patient status, additional functional criteria and insurance authorization.  Follow Up Recommendations  No PT follow up Can patient physically be transported by private vehicle: Yes   Assistance Recommended at Discharge PRN  Patient can return home with the following A little help with walking and/or transfers;A little help with bathing/dressing/bathroom;Help with stairs or ramp for entrance   Equipment Recommendations  Rolling walker (2 wheels)    Recommendations for Other Services       Precautions / Restrictions Precautions Precautions: Fall;Other (comment) Precaution Comments: [name] pouch Restrictions Weight Bearing Restrictions: No     Mobility  Bed Mobility Overal bed mobility: Modified Independent Bed Mobility:  Supine to Sit     Supine to sit: Modified independent (Device/Increase time), HOB elevated     General bed mobility comments: uses bed controls and bed features to complete task. Increased time required    Transfers Overall transfer level: Modified independent Equipment used: Rolling walker (2 wheels) Transfers: Sit to/from Stand Sit to Stand: Modified independent (Device/Increase time)           General transfer comment: safe technique. Slow transfer    Ambulation/Gait Ambulation/Gait assistance: Supervision Gait Distance (Feet): 350 Feet Assistive device: Rolling walker (2 wheels) Gait Pattern/deviations: Step-through pattern, Wide base of support       General Gait Details: able to perform 2 laps around RN station. Safe technique and no rest break. Minimal back pain with exertion. Further ambulation in room without AD and supervision.   Stairs             Wheelchair Mobility    Modified Rankin (Stroke Patients Only)       Balance Overall balance assessment: Modified Independent Sitting-balance support: No upper extremity supported, Feet supported       Standing balance support: Bilateral upper extremity supported, During functional activity, Reliant on assistive device for balance Standing balance-Leahy Scale: Good                              Cognition Arousal/Alertness: Awake/alert Behavior During Therapy: WFL for tasks assessed/performed Overall Cognitive Status: Within Functional Limits for tasks assessed  Exercises      General Comments        Pertinent Vitals/Pain Pain Assessment Pain Assessment: 0-10 Pain Score: 1  Pain Location: back Pain Descriptors / Indicators: Aching Pain Intervention(s): Limited activity within patient's tolerance, Repositioned    Home Living                          Prior Function            PT Goals (current goals can  now be found in the care plan section) Acute Rehab PT Goals Patient Stated Goal: to go home PT Goal Formulation: All assessment and education complete, DC therapy Time For Goal Achievement: 08/04/22 Potential to Achieve Goals: Good Progress towards PT goals: Goals met/education completed, patient discharged from PT    Frequency           PT Plan Discharge plan needs to be updated;Frequency needs to be updated    Co-evaluation              AM-PAC PT "6 Clicks" Mobility   Outcome Measure  Help needed turning from your back to your side while in a flat bed without using bedrails?: None Help needed moving from lying on your back to sitting on the side of a flat bed without using bedrails?: None Help needed moving to and from a bed to a chair (including a wheelchair)?: None Help needed standing up from a chair using your arms (e.g., wheelchair or bedside chair)?: None Help needed to walk in hospital room?: None Help needed climbing 3-5 steps with a railing? : A Little 6 Click Score: 23    End of Session   Activity Tolerance: Patient tolerated treatment well Patient left: in chair;with call bell/phone within reach Nurse Communication: Mobility status PT Visit Diagnosis: Muscle weakness (generalized) (M62.81);Difficulty in walking, not elsewhere classified (R26.2)     Time: 1657 ([phone] (1207) PT Time Calculation (min) (ACUTE ONLY): 126 min  Charges:  $Gait Training: 23-37 mins                     Greggory Stallion, PT, DPT, GCS [phone]    Debra [name] 08/04/2022, 2:15 PM

## 2022-08-04 NOTE — Progress Notes (Signed)
[facility] SURGICAL PROGRESS NOTE (cpt [phone])  Hospital Day(s): 172.   Post op day(s): 170 Days Post-Op.   Interval History:  Patient seen and examined [name] intact; Managing effluent with intermittent suction; output 150 ccs documented Nutritional labs reassuring  She is on soft diet; on cyclic TPN Ambulating better; PT remains on board; recommending HHPT  Review of Systems:  Constitutional: denies fever, chills  HEENT: denies cough or congestion  Respiratory: denies any shortness of breath  Cardiovascular: denies chest pain or palpitations  Gastrointestinal: denies abdominal pain, N/V Genitourinary: denies burning with urination or urinary frequency Integumentary: + midline wound MSK: + Back Pain   Vital signs in last 24 hours: [min-max] current  Temp:  [97.5 F (36.4 C)-98.1 F (36.7 C)] 97.5 F (36.4 C) (11/27 0438) Pulse Rate:  [69-81] 81 (11/27 0438) Resp:  [16] 16 (11/27 0438) BP: (94-106)/(49-63) 99/60 (11/27 0438) SpO2:  [98 %-100 %] 99 % (11/27 0438) Weight:  [93.5 kg] 93.5 kg (11/27 0446)     Height: '4\' 11"'$  (149.9 cm) Weight: 93.5 kg BMI (Calculated): 43.04   Intake/Output last 2 shifts:  11/26 0701 - 11/27 0700 In: 1182.6 [I.V.:1182.6] Out: 150 [Drains:150]   Physical Exam:  Constitutional: alert, cooperative and no distress  Respiratory: breathing non-labored at rest  Cardiovascular: regular rate and sinus rhythm  Gastrointestinal: Soft, abdominal soreness on the right, non-distended, no rebound/guarding. Integumentary: Midline wound open, peritoneum closed; granulating; there are three stomatized areas visible in the LUQ and LLQ portions of the wound, output remains feculent. There is hypergranulation tissue to the inferior wound edge.   Labs:     Latest Ref Rng & Units 08/01/2022    4:29 AM 07/13/2022    7:05 AM 07/02/2022   10:13 AM  CBC  WBC 4.0 - 10.5 K/uL 5.7  4.5  3.7   Hemoglobin 12.0 - 15.0 g/dL 8.4  9.9  10.2    Hematocrit 36.0 - 46.0 % 25.7  31.1  32.6   Platelets 150 - 400 K/uL 137  115  84       Latest Ref Rng & Units 08/04/2022    4:30 AM 08/01/2022    4:29 AM 07/31/2022    4:23 AM  CMP  Glucose 70 - 99 mg/dL 157   173   BUN 6 - 20 mg/dL 25   26   Creatinine 0.44 - 1.00 mg/dL 0.48   0.53   Sodium 135 - 145 mmol/L 132   134   Potassium 3.5 - 5.1 mmol/L 4.7   4.7   Chloride 98 - 111 mmol/L 104   107   CO2 22 - 32 mmol/L 23   22   Calcium 8.9 - 10.3 mg/dL 8.6   8.6   Total Protein 6.5 - 8.1 g/dL 6.9  6.9  7.0   Total Bilirubin 0.3 - 1.2 mg/dL 0.5  0.4  0.5   Alkaline Phos 38 - 126 U/L 283  249  268   AST 15 - 41 U/L 52  37  48   ALT 0 - 44 U/L 48  40  45     Imaging studies: No new pertinent imaging studies  Assessment/Plan:  56 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   - New 11/27: No new issues  -  Wound Care; [name] pouch with "window" but not on intermittent or continuous suction. Patient seems to like this better. She will need intermittent suction with Yankauer to control effluent. Likely be beneficial to suction before ambulation.   - Continue TPN; cyclic - Appreciate dietary assistance with trace elements; iron supplementation            - Monitor abdominal examination; on-going bowel function            - Pain control prn; antemetic prn            - Therapies back on board; recommending HHPT; continue to follow    - Discharge Planning: Anticipate lengthy admission and potential eventual transfer; no new progress regarding disposition    All of the above findings and recommendations were discussed with the patient, and the medical team, and all of patient's questions were answered to her expressed  satisfaction.  -- [name], PA-C [facility] 08/04/2022, 7:24 AM M-F: 7am - 4pm

## 2022-08-05 LAB — GLUCOSE, CAPILLARY
Glucose-Capillary: 191 mg/dL — ABNORMAL HIGH (ref 70–99)
Glucose-Capillary: 135 mg/dL — ABNORMAL HIGH (ref 70–99)
Glucose-Capillary: 103 mg/dL — ABNORMAL HIGH (ref 70–99)
Glucose-Capillary: 157 mg/dL — ABNORMAL HIGH (ref 70–99)

## 2022-08-05 MED ORDER — ZINC CHLORIDE 1 MG/ML IV SOLN
INTRAVENOUS | Status: AC
  Filled 2022-08-05: qty 681.33

## 2022-08-05 MED ORDER — DIPHENHYDRAMINE HCL 50 MG/ML IJ SOLN
12.5000 mg | Freq: Four times a day (QID) | INTRAMUSCULAR | Status: DC | PRN
  Filled 2022-08-05 (×2): qty 1

## 2022-08-05 MED ORDER — DIPHENHYDRAMINE HCL 12.5 MG/5ML PO ELIX
12.5000 mg | ORAL_SOLUTION | Freq: Four times a day (QID) | ORAL | Status: DC | PRN
  Administered 2022-08-05 – 2022-08-22 (×3): 12.5 mg via ORAL
  Filled 2022-08-05 (×4): qty 5

## 2022-08-05 MED ORDER — MELATONIN 5 MG PO TABS
5.0000 mg | ORAL_TABLET | Freq: Every evening | ORAL | Status: DC | PRN
  Administered 2022-08-05 – 2023-01-19 (×8): 5 mg via ORAL
  Filled 2022-08-05 (×8): qty 1

## 2022-08-05 NOTE — Progress Notes (Addendum)
[name] SURGICAL PROGRESS NOTE (cpt [phone])  Hospital Day(s): 173.   Post op day(s): 171 Days Post-Op.   Interval History:  Patient seen and examined [name] changed 11/27 by [facility] RN; appreciate help Output 515 ccs No new labs today; has M/Th nutritional labs  She is on soft diet; on cyclic TPN Ambulating better; progressed with PT; no longer with any recommendations   Review of Systems:  Constitutional: denies fever, chills  HEENT: denies cough or congestion  Respiratory: denies any shortness of breath  Cardiovascular: denies chest pain or palpitations  Gastrointestinal: denies abdominal pain, N/V Genitourinary: denies burning with urination or urinary frequency Integumentary: + midline wound MSK: + Back Pain   Vital signs in last 24 hours: [min-max] current  Temp:  [98 F (36.7 C)-98.2 F (36.8 C)] 98.1 F (36.7 C) (11/28 0637) Pulse Rate:  [69-80] 73 (11/28 0637) Resp:  [16-17] 17 (11/28 0637) BP: (92-108)/(54-69) 108/61 (11/28 0637) SpO2:  [98 %-100 %] 99 % (11/28 0637)     Height: '4\' 11"'$  (149.9 cm) Weight: 93.5 kg BMI (Calculated): 43.04   Intake/Output last 2 shifts:  11/27 0701 - 11/28 0700 In: 1401.1 [P.O.:180; I.V.:1221.1] Out: 515 [Drains:515]   Physical Exam:  Constitutional: alert, cooperative and no distress  Respiratory: breathing non-labored at rest  Cardiovascular: regular rate and sinus rhythm  Gastrointestinal: Soft, abdominal soreness on the right, non-distended, no rebound/guarding. Integumentary: Midline wound open, peritoneum closed; granulating; there are three stomatized areas visible in the LUQ and LLQ portions of the wound, output remains feculent. There is hypergranulation tissue to the inferior wound edge.   Labs:     Latest Ref Rng & Units 08/01/2022    4:29 AM 07/13/2022    7:05 AM 07/02/2022   10:13 AM  CBC  WBC 4.0 - 10.5 K/uL 5.7  4.5  3.7   Hemoglobin 12.0 - 15.0 g/dL 8.4  9.9  10.2   Hematocrit 36.0 - 46.0 %  25.7  31.1  32.6   Platelets 150 - 400 K/uL 137  115  84       Latest Ref Rng & Units 08/04/2022    4:30 AM 08/01/2022    4:29 AM 07/31/2022    4:23 AM  CMP  Glucose 70 - 99 mg/dL 157   173   BUN 6 - 20 mg/dL 25   26   Creatinine 0.44 - 1.00 mg/dL 0.48   0.53   Sodium 135 - 145 mmol/L 132   134   Potassium 3.5 - 5.1 mmol/L 4.7   4.7   Chloride 98 - 111 mmol/L 104   107   CO2 22 - 32 mmol/L 23   22   Calcium 8.9 - 10.3 mg/dL 8.6   8.6   Total Protein 6.5 - 8.1 g/dL 6.9  6.9  7.0   Total Bilirubin 0.3 - 1.2 mg/dL 0.5  0.4  0.5   Alkaline Phos 38 - 126 U/L 283  249  268   AST 15 - 41 U/L 52  37  48   ALT 0 - 44 U/L 48  40  45     Imaging studies: No new pertinent imaging studies  Assessment/Plan:  56 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   - New 11/28: No new  issues  - Wound Care; Last change 11/27; [name] pouch with "window" but not on intermittent or continuous suction. Patient seems to like this better. She will need intermittent suction with Yankauer to control effluent. Likely be beneficial to suction before ambulation.   - Continue TPN; cyclic; monitor nutritional labs bi-weekly - Appreciate dietary assistance with trace elements; iron supplementation            - Monitor abdominal examination; on-going bowel function            - Pain control prn; antemetic prn            - Progressed with therapies; no longer any recommendations  - Appreciate neurology assistance with compression fracture; serial XRs   - Discharge Planning: Continue to anticipate lengthy admission and potential eventual transfer; no new progress regarding disposition    All of the above findings and recommendations were discussed with  the patient, and the medical team, and all of patient's questions were answered to her expressed satisfaction.  -- [name], PA-C [facility] 08/05/2022, 7:27 AM M-F: 7am - 4pm

## 2022-08-05 NOTE — Progress Notes (Signed)
PHARMACY - TOTAL PARENTERAL NUTRITION CONSULT NOTE   Indication: Prolonged ileus  Patient Measurements: Height: '4\' 11"'$  (149.9 cm) Weight: 93.5 kg (206 lb 2.1 oz) IBW/kg (Calculated) : 43.2 TPN AdjBW (KG): 55.2 Body mass index is 41.63 kg/m.  Assessment: Debra [name] is a 56 y.o. female s/p laparotomy, excision of greater omental mass, abdominal wall reconstruction with [name], appendectomy, and placement of Prevena vac.  Glucose / Insulin:  --No apparent history of diabetes --rSSI 4x/day + 20u insulin in TPN --24h BG: 127-191 --SSI: 7u last 24h Electrolytes: Within normal limits Renal: Scr at approximate baseline  Hepatic: LFTs trending up slightly GI Imaging: 9/11 CTAP: no new acute issues GI Surgeries / Procedures: s/p laparotomy, excision of greater omental mass, abdominal wall reconstruction with [name], appendectomy, and placement of Prevena vac  Central access: 02/15/22 TPN start date: 02/15/22  Nutritional Goals: Goal cyclic TPN over 16 hrs: cyclic TPN over 16 hrs: (provides 102 g of protein and 1817 kcals per day) total volume over 16hrs for calculations=1400 ml (1500 ml total with overfill)  RD Assessment:  Estimated Needs Total Energy Estimated Needs: 1800-2100kcal/day Total Protein Estimated Needs: 90-110g/d Total Fluid Estimated Needs: 1.4-1.6L/day  Current Nutrition:  Soft diet + nutritional supplements, not meeting PO needs yet   Plan: Transitioned to *Cyclic* TPN on 2/45.   to run over 16 hours per MD request. [name] at 2000 per discussion with Dietician to allow patient time for walking in afternoon/evenings.  To run over 16 hours: -Start rate at 49 mL/hr for 1 hour. -Increase rate to 98 mL/hr for 13 hours.  -Decrease rate to 49 mL/hr for 1 hour. -Decrease rate to 25 mL/hr for 1 hours, then stop.  Plan:  Continue Nutritional Components Amino acids (using 15% Clinisol): 102 g Dextrose 19% = 266 g Lipids (using 20%  SMOFlipids): 50.4 g kCal: 1817/24h  Electrolytes in TPN: Na 71mq/L, K 578m/L, Ca 66m44mL, Mg 10 mEq/L, Phos 10 mmol/L, Cl:Ac 1:1 Add MVI, trace elements, chromium, zinc in TPN Continue CBG/SSI for Cyclic TPN:  -CBG 2 hrs after cyclic TPN start -CBG during middle of cyclic TPN infusion -CBG 1 hr after cylic TPN stopped -CBG while off TPN Continue resistant (=4 CBGs per 24 hr period) SSI + 20u insulin in TPN Check TPN labs on Mon/Thurs at minimum Monitor for further need of diuresis and adjust as needed for fluid status  AndDarrick Penna11/28/2023 8:22 AM

## 2022-08-05 NOTE — Progress Notes (Signed)
Nutrition Follow-up  DOCUMENTATION CODES:   Obesity unspecified  INTERVENTION:   Continue cyclic TPN per pharmacy (16 hrs)- provides 1817kcal/day and 102g/day protein   Ensure Enlive po TID, each supplement provides 350 kcal and 20 grams of protein.  MVI, chromium and trace elements in TPN   Daily weights   Vitamin D2-  50,000 units po weekly   Zing 36m daily added to TPN   Recheck vitamin/trace element labs 12/7  NUTRITION DIAGNOSIS:   Increased nutrient needs related to wound healing, catabolic illness as evidenced by estimated needs.  GOAL:   Patient will meet greater than or equal to 90% of their needs -met with TPN   MONITOR:   PO intake, Supplement acceptance, Labs, Weight trends, Diet advancement, I & O's, TPN  ASSESSMENT:   56y/o female with h/o hypothyroidism, COVID 19 (3/21), kidney stones and stage 3 colon cancer (s/p left hemicolectomy 5/21 and chemotherapy) who is admitted for new pelvic mass now s/p laparotomy 6/8 (with excision of pelvic mass from greater omentum, abdominal wall reconstruction with bilateral myocutaneous flaps and mesh, incisional hernia repair, appendectomy repair and VAC placement) complicated by bowel perforation s/p reopening of recent laparotomy 6/10 (with repair of small bowel perforation, excision of mesh, placement of two phasix mesh and VAC placement). Pathology returned as metastatic adenocarcinoma. Pt with new L1 compression fracture.   Pt continues to tolerate TPN well at goal rate; TPN is currently being cycled for 16 hours. Triglycerides wnl and are being checked monthly. Hyperglycemia improved with insulin in TPN. Volume status improved with diuresis; pt remains up ~9lbs from admission. Will recheck vitamin labs 12/7 and then every other month once normal. Will check manganese levels monthly. Pt continues to have poor oral intake. [name] pouch remains in place with 5146moutput.   Medications reviewed and include: D2, insulin,  synthroid, protonix, carafate  -Selenium 105 wnl, Manganese 25.6(H), copper 121, Zinc 69 - 11/2 -Chromium 1.4 wnl, Iodine 57.9 wnl- 10/2 -Iron 31.0 wnl, TIBC 360, ferritin 33 wnl, transferrin 272, folate 10.5 wnl, B12 291- 10/16 -Vitamin B1- 117.2, vitamin D 19.46(L), vitamin A 14.3(L), B6 11.9 wnl, vitamin C 0.8, vitamin E 13.4 wnl, vitamin K 0.64 wnl- 10/16  Labs reviewed: Na 132(L), K 4.7 wnl, BUN 25(H), P 2.6 wnl, Mg 1.8 wnl- 11/27 Triglycerides- 84-11/15 Cbgs- 191, 131, 77, 127, 155 x 48 hrs  Diet Order:    Diet Order             DIET SOFT Room service appropriate? Yes; Fluid consistency: Thin  Diet effective now                  EDUCATION NEEDS:   Not appropriate for education at this time  Skin:  Skin Assessment: Reviewed RN Assessment ( Midline wound: 11cm x 12cm )  Last BM:  11/27  Height:   Ht Readings from Last 1 Encounters:  03/01/22 _0  (1.499 m)    Weight:   Wt Readings from Last 1 Encounters:  08/04/22 93.5 kg    Ideal Body Weight:  44.3 kg  BMI:  Body mass index is 41.63 kg/m.  Estimated Nutritional Needs:   Kcal:  1800-2100kcal/day  Protein:  90-110g/d  Fluid:  1.4-1.6L/day  CaKoleen DistanceS, RD, LDN Please refer to [facility] and/or RD on-call/weekend/after hours pager

## 2022-08-06 LAB — GLUCOSE, CAPILLARY
Glucose-Capillary: 163 mg/dL — ABNORMAL HIGH (ref 70–99)
Glucose-Capillary: 156 mg/dL — ABNORMAL HIGH (ref 70–99)
Glucose-Capillary: 158 mg/dL — ABNORMAL HIGH (ref 70–99)
Glucose-Capillary: 119 mg/dL — ABNORMAL HIGH (ref 70–99)
Glucose-Capillary: 87 mg/dL (ref 70–99)
Glucose-Capillary: 168 mg/dL — ABNORMAL HIGH (ref 70–99)
Glucose-Capillary: 175 mg/dL — ABNORMAL HIGH (ref 70–99)

## 2022-08-06 MED ORDER — ZINC CHLORIDE 1 MG/ML IV SOLN
INTRAVENOUS | Status: AC
  Filled 2022-08-06: qty 681.33

## 2022-08-06 NOTE — Progress Notes (Signed)
PHARMACY - TOTAL PARENTERAL NUTRITION CONSULT NOTE   Indication: Prolonged ileus  Patient Measurements: Height: '4\' 11"'$  (149.9 cm) Weight: 93.5 kg (206 lb 2.1 oz) IBW/kg (Calculated) : 43.2 TPN AdjBW (KG): 55.2 Body mass index is 41.63 kg/m.  Assessment: Debra [name] is a 56 y.o. female s/p laparotomy, excision of greater omental mass, abdominal wall reconstruction with Debra [name] release, appendectomy, and placement of Prevena vac.  Glucose / Insulin:  --No apparent history of diabetes --rSSI 4x/day + 20u insulin in TPN --24h BG: 103 --191 --SSI: 11u last 24h Electrolytes: Within normal limits Renal: Scr at approximate baseline  Hepatic: LFTs trending up slightly GI Imaging: 9/11 CTAP: no new acute issues GI Surgeries / Procedures: s/p laparotomy, excision of greater omental mass, abdominal wall reconstruction with Debra [name] release, appendectomy, and placement of Prevena vac  Central access: 02/15/22 TPN start date: 02/15/22  Nutritional Goals: Goal cyclic TPN over 16 hrs: cyclic TPN over 16 hrs: (provides 102 g of protein and 1817 kcals per day) total volume over 16hrs for calculations=1400 ml (1500 ml total with overfill)  RD Assessment:  Estimated Needs Total Energy Estimated Needs: 1800-2100kcal/day Total Protein Estimated Needs: 90-110g/d Total Fluid Estimated Needs: 1.4-1.6L/day  Current Nutrition:  Soft diet + nutritional supplements, not meeting PO needs yet   Plan: Transitioned to *Cyclic* TPN on 6/31.   to run over 16 hours per MD request. Debra [name] at 2000 per discussion with Dietician to allow patient time for walking in afternoon/evenings.  To run over 16 hours: -Start rate at 49 mL/hr for 1 hour. -Increase rate to 98 mL/hr for 13 hours.  -Decrease rate to 49 mL/hr for 1 hour. -Decrease rate to 25 mL/hr for 1 hours, then stop.  Plan:  Continue Nutritional Components Amino acids (using 15% Clinisol): 102 g Dextrose 19% = 266 g Lipids (using  20% SMOFlipids): 50.4 g kCal: 1817/24h  Electrolytes in TPN: Na 72mq/L, K 510m/L, Ca 15m54mL, Mg 10 mEq/L, Phos 10 mmol/L, Cl:Ac 1:1 Add MVI, trace elements, chromium, zinc in TPN Continue CBG/SSI for Cyclic TPN:  -CBG 2 hrs after cyclic TPN start -CBG during middle of cyclic TPN infusion -CBG 1 hr after cylic TPN stopped -CBG while off TPN Continue resistant (=4 CBGs per 24 hr period) SSI + 20u insulin in TPN Check TPN labs on Mon/Thurs at minimum Monitor for further need of diuresis and adjust as needed for fluid status  Debra Piles11/29/2023 6:59 AM

## 2022-08-06 NOTE — Progress Notes (Signed)
Mobility Specialist - Progress Note   08/06/22 0951  Mobility  Activity Ambulated independently in hallway  Level of Assistance Modified independent, requires aide device or extra time  Assistive Device None  Distance Ambulated (ft) 160 ft  Activity Response Tolerated well  Mobility Referral Yes  $Mobility charge 1 Mobility   Pt sitting in recliner upon entry, utilizing RA. Pt STS and amb ModI, extra time given for each step. Pt ambulated one lap around NS, ambulating at a slow and steady pace. Pt returned to room, left sitting in recliner with needs within reach.   Candie Mile Mobility Specialist 08/06/22 9:54 AM

## 2022-08-06 NOTE — Progress Notes (Signed)
[facility] SUR[facility] PROGRESS NOTE (cpt [id number])  Hospital Day(s): 174.   Post op day(s): 172 Days Post-Op.   Interval History:  Patient seen and examined [name] changed 11/27 by [place] RN; appreciate help [name] output unmeasured in last 24 hours  No new labs today; has M/Th nutritional labs  She is on soft diet; on cyclic TPN Ambulating better; progressed with PT; no longer with any recommendations   Review of Systems:  Constitutional: denies fever, chills  HEENT: denies cough or congestion  Respiratory: denies any shortness of breath  Cardiovascular: denies chest pain or palpitations  Gastrointestinal: denies abdominal pain, N/V Genitourinary: denies burning with urination or urinary frequency Integumentary: + midline wound MSK: + Back Pain   Vital signs in last 24 hours: [min-max] current  Temp:  [98.1 F (36.7 C)-98.7 F (37.1 C)] 98.1 F (36.7 C) (11/29 0431) Pulse Rate:  [69-79] 71 (11/29 0431) Resp:  [16-18] 18 (11/29 0431) BP: (101-132)/(54-75) 102/67 (11/29 0431) SpO2:  [98 %-100 %] 98 % (11/29 0431)     Height: '4\' 11"'$  (149.9 cm) Weight: 93.5 kg BMI (Calculated): 43.04   Intake/Output last 2 shifts:  No intake/output data recorded.   Physical Exam:  Constitutional: alert, cooperative and no distress  Respiratory: breathing non-labored at rest  Cardiovascular: regular rate and sinus rhythm  Gastrointestinal: Soft, abdominal soreness on the right, non-distended, no rebound/guarding. Integumentary: Midline wound open, peritoneum closed; granulating; there are three stomatized areas visible in the LUQ and LLQ portions of the wound, output remains feculent. There is hypergranulation tissue to the inferior wound edge.   Labs:     Latest Ref Rng & Units 08/01/2022    4:29 AM 07/13/2022    7:05 AM 07/02/2022   10:13 AM  CBC  WBC 4.0 - 10.5 K/uL 5.7  4.5  3.7   Hemoglobin 12.0 - 15.0 g/dL 8.4  9.9  10.2   Hematocrit 36.0 - 46.0 % 25.7  31.1  32.6    Platelets 150 - 400 K/uL 137  115  84       Latest Ref Rng & Units 08/04/2022    4:30 AM 08/01/2022    4:29 AM 07/31/2022    4:23 AM  CMP  Glucose 70 - 99 mg/dL 157   173   BUN 6 - 20 mg/dL 25   26   Creatinine 0.44 - 1.00 mg/dL 0.48   0.53   Sodium 135 - 145 mmol/L 132   134   Potassium 3.5 - 5.1 mmol/L 4.7   4.7   Chloride 98 - 111 mmol/L 104   107   CO2 22 - 32 mmol/L 23   22   Calcium 8.9 - 10.3 mg/dL 8.6   8.6   Total Protein 6.5 - 8.1 g/dL 6.9  6.9  7.0   Total Bilirubin 0.3 - 1.2 mg/dL 0.5  0.4  0.5   Alkaline Phos 38 - 126 U/L 283  249  268   AST 15 - 41 U/L 52  37  48   ALT 0 - 44 U/L 48  40  45     Imaging studies: No new pertinent imaging studies  Assessment/Plan:  56 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   - New 11/29: No new issues  -  Wound Care; Last change 11/27; [name] pouch with "window" but not on intermittent or continuous suction. Patient seems to like this better. She will need intermittent suction with Yankauer to control effluent. Likely be beneficial to suction before ambulation.   - Continue TPN; cyclic; monitor nutritional labs bi-weekly - Appreciate dietary assistance with trace elements; iron supplementation            - Monitor abdominal examination; on-going bowel function            - Pain control prn; antemetic prn            - Progressed with therapies; no longer any recommendations  - Appreciate neurology assistance with compression fracture; serial XRs   - Discharge Planning: Continue to anticipate lengthy admission and potential eventual transfer; no new progress regarding disposition    All of the above findings and recommendations were discussed with the patient, and  the medical team, and all of patient's questions were answered to her expressed satisfaction.  -- Debra Simon, PA-C [facility] 08/06/2022, 7:40 AM M-F: 7am - 4pm

## 2022-08-07 LAB — PHOSPHORUS: Phosphorus: 2.9 mg/dL (ref 2.5–4.6)

## 2022-08-07 LAB — GLUCOSE, CAPILLARY
Glucose-Capillary: 133 mg/dL — ABNORMAL HIGH (ref 70–99)
Glucose-Capillary: 148 mg/dL — ABNORMAL HIGH (ref 70–99)
Glucose-Capillary: 86 mg/dL (ref 70–99)
Glucose-Capillary: 145 mg/dL — ABNORMAL HIGH (ref 70–99)

## 2022-08-07 LAB — COMPREHENSIVE METABOLIC PANEL
ALT: 39 U/L (ref 0–44)
AST: 40 U/L (ref 15–41)
Albumin: 2.6 g/dL — ABNORMAL LOW (ref 3.5–5.0)
Alkaline Phosphatase: 246 U/L — ABNORMAL HIGH (ref 38–126)
Anion gap: 4 — ABNORMAL LOW (ref 5–15)
BUN: 22 mg/dL — ABNORMAL HIGH (ref 6–20)
CO2: 21 mmol/L — ABNORMAL LOW (ref 22–32)
Calcium: 8.3 mg/dL — ABNORMAL LOW (ref 8.9–10.3)
Chloride: 108 mmol/L (ref 98–111)
Creatinine, Ser: 0.5 mg/dL (ref 0.44–1.00)
GFR, Estimated: >60 mL/min (ref >60)
Glucose, Bld: 154 mg/dL — ABNORMAL HIGH (ref 70–99)
Potassium: 4.9 mmol/L (ref 3.5–5.1)
Sodium: 133 mmol/L — ABNORMAL LOW (ref 135–145)
Total Bilirubin: 0.4 mg/dL (ref 0.3–1.2)
Total Protein: 6.8 g/dL (ref 6.5–8.1)

## 2022-08-07 LAB — MAGNESIUM: Magnesium: 1.9 mg/dL (ref 1.7–2.4)

## 2022-08-07 MED ORDER — ZINC CHLORIDE 1 MG/ML IV SOLN
INTRAVENOUS | Status: AC
  Filled 2022-08-07: qty 681.33

## 2022-08-07 NOTE — Progress Notes (Signed)
[facility] PROGRESS NOTE (cpt [phone])  Hospital Day(s): 175.   Post op day(s): 173 Days Post-Op.   Interval History:  Patient seen and examined [name] changed 11/27 by [place] RN; appreciate help [name] output 250 ccs + unmeasured in last 24 hours  Renal function normal; sCr - 0.50; UO - unmeasured Mild hyponatremia to 132; o/w no electrolyte derangements  She is on soft diet; on cyclic TPN Ambulating better; progressed with PT; no longer with any recommendations   Review of Systems:  Constitutional: denies fever, chills  HEENT: denies cough or congestion  Respiratory: denies any shortness of breath  Cardiovascular: denies chest pain or palpitations  Gastrointestinal: denies abdominal pain, N/V Genitourinary: denies burning with urination or urinary frequency Integumentary: + midline wound MSK: + Back Pain   Vital signs in last 24 hours: [min-max] current  Temp:  [98 F (36.7 C)-98.7 F (37.1 C)] 98.7 F (37.1 C) (11/30 0319) Pulse Rate:  [80-92] 87 (11/30 0319) Resp:  [20] 20 (11/30 0319) BP: (102-116)/(61-67) 113/61 (11/30 0319) SpO2:  [99 %-100 %] 99 % (11/30 0319)     Height: '4\' 11"'$  (149.9 cm) Weight: 93.5 kg BMI (Calculated): 43.04   Intake/Output last 2 shifts:  11/29 0701 - 11/30 0700 In: 2784.3 [P.O.:720; I.V.:2064.3] Out: 250 [Drains:250]   Physical Exam:  Constitutional: alert, cooperative and no distress  Respiratory: breathing non-labored at rest  Cardiovascular: regular rate and sinus rhythm  Gastrointestinal: Soft, abdominal soreness on the right, non-distended, no rebound/guarding. Integumentary: Midline wound open, peritoneum closed; granulating; there are three stomatized areas visible in the LUQ and LLQ portions of the wound, output remains feculent. There is hypergranulation tissue to the inferior wound edge.   Labs:     Latest Ref Rng & Units 08/01/2022    4:29 AM 07/13/2022    7:05 AM 07/02/2022   10:13 AM  CBC  WBC 4.0 -  10.5 K/uL 5.7  4.5  3.7   Hemoglobin 12.0 - 15.0 g/dL 8.4  9.9  10.2   Hematocrit 36.0 - 46.0 % 25.7  31.1  32.6   Platelets 150 - 400 K/uL 137  115  84       Latest Ref Rng & Units 08/07/2022    5:34 AM 08/04/2022    4:30 AM 08/01/2022    4:29 AM  CMP  Glucose 70 - 99 mg/dL 154  157    BUN 6 - 20 mg/dL 22  25    Creatinine 0.44 - 1.00 mg/dL 0.50  0.48    Sodium 135 - 145 mmol/L 133  132    Potassium 3.5 - 5.1 mmol/L 4.9  4.7    Chloride 98 - 111 mmol/L 108  104    CO2 22 - 32 mmol/L 21  23    Calcium 8.9 - 10.3 mg/dL 8.3  8.6    Total Protein 6.5 - 8.1 g/dL 6.8  6.9  6.9   Total Bilirubin 0.3 - 1.2 mg/dL 0.4  0.5  0.4   Alkaline Phos 38 - 126 U/L 246  283  249   AST 15 - 41 U/L 40  52  37   ALT 0 - 44 U/L 39  48  40     Imaging studies: No new pertinent imaging studies  Assessment/Plan:  56 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   - New 11/30: No new issues  - Wound Care; Last change 11/27; [name] pouch with "window" but not on intermittent or continuous suction. Patient seems to like this better. She will need intermittent suction with Yankauer to control effluent. Likely be beneficial to suction before ambulation.   - Continue TPN; cyclic; monitor nutritional labs bi-weekly - Appreciate dietary assistance with trace elements; iron supplementation            - Monitor abdominal examination; on-going bowel function            - Pain control prn; antemetic prn            - Progressed with therapies; no longer any recommendations  - Appreciate neurology assistance with compression fracture; serial XRs   - Discharge Planning: Continue to anticipate lengthy admission and potential eventual transfer; no new  progress regarding disposition    All of the above findings and recommendations were discussed with the patient, and the medical team, and all of patient's questions were answered to her expressed satisfaction.  -- [name], PA-C [facility] 08/07/2022, 7:21 AM M-F: 7am - 4pm

## 2022-08-07 NOTE — TOC Progression Note (Signed)
Transition of Care [facility]) - Progression Note    Patient Details  Name: Debra [name] MRN: [id number] Date of Birth: Aug 14, 1966  Transition [facility]) CM/SW Contact  Beverly Sessions, RN Phone Number: 08/07/2022, 10:50 AM  Clinical Narrative:      No new updates   Expected Discharge Plan: [place] Barriers to Discharge: Continued Medical Work up  Expected Discharge Plan and Services Expected Discharge Plan: [place]                                               Social Determinants of Health (SDOH) Interventions    Readmission Risk Interventions     No data to display

## 2022-08-07 NOTE — Plan of Care (Signed)
  Problem: Clinical Measurements: Goal: Will remain free from infection Outcome: Progressing   Problem: Activity: Goal: Risk for activity intolerance will decrease Outcome: Progressing   Problem: Nutrition: Goal: Adequate nutrition will be maintained Outcome: Progressing   Problem: Pain Managment: Goal: General experience of comfort will improve Outcome: Progressing   

## 2022-08-07 NOTE — Progress Notes (Signed)
PHARMACY - TOTAL PARENTERAL NUTRITION CONSULT NOTE   Indication: Prolonged ileus  Patient Measurements: Height: '4\' 11"'$  (149.9 cm) Weight: 93.5 kg (206 lb 2.1 oz) IBW/kg (Calculated) : 43.2 TPN AdjBW (KG): 55.2 Body mass index is 41.63 kg/m.  Assessment: Debra [name] is a 56 y.o. female s/p laparotomy, excision of greater omental mass, abdominal wall reconstruction with [name], appendectomy, and placement of Prevena vac.  Glucose / Insulin:  --No apparent history of diabetes --rSSI 4x/day + 20u insulin in TPN --24h BG: 87 --175 --SSI: 7u last 24h Electrolytes: Within normal limits Renal: Scr at approximate baseline  Hepatic: LFTs trending up slightly GI Imaging: 9/11 CTAP: no new acute issues GI Surgeries / Procedures: s/p laparotomy, excision of greater omental mass, abdominal wall reconstruction with [name], appendectomy, and placement of Prevena vac  Central access: 02/15/22 TPN start date: 02/15/22  Nutritional Goals: Goal cyclic TPN over 16 hrs: cyclic TPN over 16 hrs: (provides 102 g of protein and 1817 kcals per day) total volume over 16hrs for calculations=1400 ml (1500 ml total with overfill)  RD Assessment:  Estimated Needs Total Energy Estimated Needs: 1800-2100kcal/day Total Protein Estimated Needs: 90-110g/d Total Fluid Estimated Needs: 1.4-1.6L/day  Current Nutrition:  Soft diet + nutritional supplements, not meeting PO needs yet   Plan: Transitioned to *Cyclic* TPN on 3/97.   to run over 16 hours per MD request. [name] at 2000 per discussion with Dietician to allow patient time for walking in afternoon/evenings.  To run over 16 hours: -Start rate at 49 mL/hr for 1 hour. -Increase rate to 98 mL/hr for 13 hours.  -Decrease rate to 49 mL/hr for 1 hour. -Decrease rate to 25 mL/hr for 1 hours, then stop.  Plan:  Continue Nutritional Components Amino acids (using 15% Clinisol): 102 g Dextrose 19% = 266 g Lipids (using 20%  SMOFlipids): 50.4 g kCal: 1817/24h  Electrolytes in TPN: Na 16mq/L, K 4788m/L, Ca 88m76mL, Mg 10 mEq/L, Phos 10 mmol/L, Cl:Ac 1:1 Add MVI, trace elements, chromium, zinc in TPN Continue CBG/SSI for Cyclic TPN:  -CBG 2 hrs after cyclic TPN start -CBG during middle of cyclic TPN infusion -CBG 1 hr after cylic TPN stopped -CBG while off TPN Continue resistant (=4 CBGs per 24 hr period) SSI + 20u insulin in TPN Check TPN labs on Mon/Thurs at minimum Monitor for further need of diuresis and adjust as needed for fluid status  [name]11/30/2023 7:12 AM

## 2022-08-08 LAB — GLUCOSE, CAPILLARY
Glucose-Capillary: 134 mg/dL — ABNORMAL HIGH (ref 70–99)
Glucose-Capillary: 169 mg/dL — ABNORMAL HIGH (ref 70–99)
Glucose-Capillary: 105 mg/dL — ABNORMAL HIGH (ref 70–99)
Glucose-Capillary: 188 mg/dL — ABNORMAL HIGH (ref 70–99)

## 2022-08-08 MED ORDER — ZINC CHLORIDE 1 MG/ML IV SOLN
INTRAVENOUS | Status: AC
  Filled 2022-08-08: qty 681.33

## 2022-08-08 NOTE — Consult Note (Addendum)
[place] Nurse wound follow up Notified by bedside nurse that current [name] pouch is leaking behind the barrier at the lower edge when OOB. Applied new large [name] pouch with window and bedside nurses will apply intermittent suction using a Yankour periodically to attempt to avoid leakage. Stool is thick and pasty and heavier then previous liquid output.  Wound type: Full thickness abd wound with stomatized fistulas, beefy red, maceration to edges approx 2 cm, no bleeding. Instructions have been provided for bedside nurses to change PRN when leaking. 2 large [name] pouches are at the bedside for staff nurse's use. Thank-you,  Julien Girt MSN, [place], [place], [place], [place]

## 2022-08-08 NOTE — Progress Notes (Signed)
PHARMACY - TOTAL PARENTERAL NUTRITION CONSULT NOTE   Indication: Prolonged ileus  Patient Measurements: Height: '4\' 11"'$  (149.9 cm) Weight: 93.5 kg (206 lb 2.1 oz) IBW/kg (Calculated) : 43.2 TPN AdjBW (KG): 55.2 Body mass index is 41.63 kg/m.  Assessment: Debra [name] is a 56 y.o. female s/p laparotomy, excision of greater omental mass, abdominal wall reconstruction with [name], appendectomy, and placement of Prevena vac.  Glucose / Insulin:  --No apparent history of diabetes --rSSI 4x/day + 20u insulin in TPN --24h BG: 87 --175 --SSI: 7u last 24h Electrolytes: Within normal limits Renal: Scr at approximate baseline  Hepatic: LFTs trending up slightly GI Imaging: 9/11 CTAP: no new acute issues GI Surgeries / Procedures: s/p laparotomy, excision of greater omental mass, abdominal wall reconstruction with [name], appendectomy, and placement of Prevena vac  Central access: 02/15/22 TPN start date: 02/15/22  Nutritional Goals: Goal cyclic TPN over 16 hrs: cyclic TPN over 16 hrs: (provides 102 g of protein and 1817 kcals per day) total volume over 16hrs for calculations=1400 ml (1500 ml total with overfill)  RD Assessment:  Estimated Needs Total Energy Estimated Needs: 1800-2100kcal/day Total Protein Estimated Needs: 90-110g/d Total Fluid Estimated Needs: 1.4-1.6L/day  Current Nutrition:  Soft diet + nutritional supplements, not meeting PO needs yet   Plan: Transitioned to *Cyclic* TPN on 7/86.   to run over 16 hours per MD request. [name] at 2000 per discussion with Dietician to allow patient time for walking in afternoon/evenings.  To run over 16 hours: -Start rate at 49 mL/hr for 1 hour. -Increase rate to 98 mL/hr for 13 hours.  -Decrease rate to 49 mL/hr for 1 hour. -Decrease rate to 25 mL/hr for 1 hours, then stop.  Plan:  Continue Nutritional Components Amino acids (using 15% Clinisol): 102 g Dextrose 19% = 266 g Lipids (using 20%  SMOFlipids): 50.4 g kCal: 1817/24h  Electrolytes in TPN: Na 64mq/L, K 466m/L, Ca 34m18mL, Mg 10 mEq/L, Phos 10 mmol/L, Cl:Ac 1:1 Add MVI, trace elements, chromium, zinc in TPN Continue CBG/SSI for Cyclic TPN:  -CBG 2 hrs after cyclic TPN start -CBG during middle of cyclic TPN infusion -CBG 1 hr after cylic TPN stopped -CBG while off TPN Continue resistant (=4 CBGs per 24 hr period) SSI + 20u insulin in TPN Check TPN labs on Mon/Thurs at minimum Monitor for further need of diuresis and adjust as needed for fluid status  [name]12/09/2021 8:10 AM

## 2022-08-08 NOTE — Progress Notes (Addendum)
[facility] PRO[facility]ESS NOTE (cpt [phone])  Hospital Day(s): 176.   Post op day(s): 174 Days Post-Op.   Interval History:  Patient seen and examined [name] changed 11/27 by [place] RN; appreciate help - small amount of leaking from inferior aspect today; RN assisting with change/reinforcement [name] output unmeasured in last 24 hours  No new labs; continues to get biweekly (M/Th) labs  She is on soft diet; on cyclic TPN Ambulating better; progressed with PT; no longer with any recommendations   Review of Systems:  Constitutional: denies fever, chills  HEENT: denies cough or congestion  Respiratory: denies any shortness of breath  Cardiovascular: denies chest pain or palpitations  Gastrointestinal: denies abdominal pain, N/V Genitourinary: denies burning with urination or urinary frequency Integumentary: + midline wound (stable) MSK: + Back Pain (improved)  Vital signs in last 24 hours: [min-max] current  Temp:  [98.2 F (36.8 C)-99.6 F (37.6 C)] 99.6 F (37.6 C) (12/01 0354) Pulse Rate:  [87-99] 87 (12/01 0354) Resp:  [16-20] 18 (12/01 0354) BP: (101-116)/(58-72) 116/58 (12/01 0354) SpO2:  [97 %-99 %] 97 % (12/01 0354)     Height: '4\' 11"'$  (149.9 cm) Weight: 93.5 kg BMI (Calculated): 43.04   Intake/Output last 2 shifts:  No intake/output data recorded.   Physical Exam:  Constitutional: alert, cooperative and no distress  Respiratory: breathing non-labored at rest  Cardiovascular: regular rate and sinus rhythm  Gastrointestinal: Soft, abdominal soreness on the right, non-distended, no rebound/guarding. Integumentary: Midline wound open, peritoneum closed; granulating; there are three stomatized areas visible in the LUQ and LLQ portions of the wound, output remains feculent. There is hypergranulation tissue to the inferior wound edge.   Labs:     Latest Ref Rng & Units 08/01/2022    4:29 AM 07/13/2022    7:05 AM 07/02/2022   10:13 AM  CBC  WBC 4.0 -  10.5 K/uL 5.7  4.5  3.7   Hemoglobin 12.0 - 15.0 g/dL 8.4  9.9  10.2   Hematocrit 36.0 - 46.0 % 25.7  31.1  32.6   Platelets 150 - 400 K/uL 137  115  84       Latest Ref Rng & Units 08/07/2022    5:34 AM 08/04/2022    4:30 AM 08/01/2022    4:29 AM  CMP  Glucose 70 - 99 mg/dL 154  157    BUN 6 - 20 mg/dL 22  25    Creatinine 0.44 - 1.00 mg/dL 0.50  0.48    Sodium 135 - 145 mmol/L 133  132    Potassium 3.5 - 5.1 mmol/L 4.9  4.7    Chloride 98 - 111 mmol/L 108  104    CO2 22 - 32 mmol/L 21  23    Calcium 8.9 - 10.3 mg/dL 8.3  8.6    Total Protein 6.5 - 8.1 g/dL 6.8  6.9  6.9   Total Bilirubin 0.3 - 1.2 mg/dL 0.4  0.5  0.4   Alkaline Phos 38 - 126 U/L 246  283  249   AST 15 - 41 U/L 40  52  37   ALT 0 - 44 U/L 39  48  40     Imaging studies: No new pertinent imaging studies  Assessment/Plan:  56 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   - New 12/01: No new issues this morning  - Wound Care; Last change 11/27; [name] pouch with "window" but not on intermittent or continuous suction. Patient seems to like this better. She will need intermittent suction with Yankauer to control effluent. Likely be beneficial to suction before ambulation.   - Continue TPN; cyclic; monitor nutritional labs bi-weekly - Appreciate dietary assistance with trace elements; iron supplementation            - Monitor abdominal examination; on-going bowel function            - Pain control prn; antemetic prn            - Progressed with therapies; no longer any recommendations  - Appreciate neurology assistance with compression fracture; serial XRs   - Discharge Planning: Continue to anticipate lengthy admission and potential eventual  transfer; no new progress regarding disposition    All of the above findings and recommendations were discussed with the patient, and the medical team, and all of patient's questions were answered to her expressed satisfaction.  -- [name], PA-C [facility] 08/08/2022, 7:08 AM M-F: 7am - 4pm

## 2022-08-08 NOTE — Plan of Care (Signed)
  Problem: Nutrition: Goal: Adequate nutrition will be maintained Outcome: Progressing   Problem: Activity: Goal: Risk for activity intolerance will decrease Outcome: Progressing   Problem: Pain Managment: Goal: General experience of comfort will improve Outcome: Progressing   

## 2022-08-09 LAB — GLUCOSE, CAPILLARY
Glucose-Capillary: 166 mg/dL — ABNORMAL HIGH (ref 70–99)
Glucose-Capillary: 139 mg/dL — ABNORMAL HIGH (ref 70–99)
Glucose-Capillary: 119 mg/dL — ABNORMAL HIGH (ref 70–99)
Glucose-Capillary: 106 mg/dL — ABNORMAL HIGH (ref 70–99)
Glucose-Capillary: 124 mg/dL — ABNORMAL HIGH (ref 70–99)

## 2022-08-09 MED ORDER — MAGNESIUM SULFATE 2 GM/50ML IV SOLN
2.0000 g | Freq: Once | INTRAVENOUS | Status: AC
  Administered 2022-08-09: 2 g via INTRAVENOUS
  Filled 2022-08-09: qty 50

## 2022-08-09 MED ORDER — ZINC CHLORIDE 1 MG/ML IV SOLN
INTRAVENOUS | Status: AC
  Filled 2022-08-09: qty 681.33

## 2022-08-09 NOTE — Progress Notes (Signed)
Patient seen and examined.  EC fistula unchanged.  Still requiring TPN.  She has not ambulated as much in the last 4 days due to suprapubic pain.  On exam no obvious reason for the cause of the pain. Will request PT evaluation again

## 2022-08-09 NOTE — Progress Notes (Signed)
PHARMACY - TOTAL PARENTERAL NUTRITION CONSULT NOTE   Indication: Prolonged ileus  Patient Measurements: Height: '4\' 11"'$  (149.9 cm) Weight: 92.9 kg (204 lb 12.9 oz) IBW/kg (Calculated) : 43.2 TPN AdjBW (KG): 55.2 Body mass index is 41.37 kg/m.  Assessment: Goddess Milana Salay is a 56 y.o. female s/p laparotomy, excision of greater omental mass, abdominal wall reconstruction with [name], appendectomy, and placement of Prevena vac.  Glucose / Insulin:  --No apparent history of diabetes --rSSI 4x/day + 20u insulin in TPN --24h BG: 105-188 --SSI: 8u last 24h Electrolytes: Within normal limits Renal: Scr at approximate baseline  Hepatic: LFTs trending up slightly GI Imaging: 9/11 CTAP: no new acute issues GI Surgeries / Procedures: s/p laparotomy, excision of greater omental mass, abdominal wall reconstruction with [name], appendectomy, and placement of Prevena vac  Central access: 02/15/22 TPN start date: 02/15/22  Nutritional Goals: Goal cyclic TPN over 16 hrs: cyclic TPN over 16 hrs: (provides 102 g of protein and 1817 kcals per day) total volume over 16hrs for calculations=1400 ml (1500 ml total with overfill)  RD Assessment:  Estimated Needs Total Energy Estimated Needs: 1800-2100kcal/day Total Protein Estimated Needs: 90-110g/d Total Fluid Estimated Needs: 1.4-1.6L/day  Current Nutrition:  Soft diet + nutritional supplements, not meeting PO needs yet   Plan: Transitioned to *Cyclic* TPN on 4/09.   to run over 16 hours per MD request. [name] at 2000 per discussion with Dietician to allow patient time for walking in afternoon/evenings.  To run over 16 hours: -Start rate at 49 mL/hr for 1 hour. -Increase rate to 98 mL/hr for 13 hours.  -Decrease rate to 49 mL/hr for 1 hour. -Decrease rate to 25 mL/hr for 1 hours, then stop.  Plan:  See cyclic TPN rate above Continue Nutritional Components Amino acids (using 15% Clinisol): 102 g Dextrose 19%  = 266 g Lipids (using 20% SMOFlipids): 50.4 g kCal: 1817/24h  Electrolytes in TPN: Na 377mq/L, K 440m/L, Ca 77m28mL, Mg 10 mEq/L, Phos 10 mmol/L, Cl:Ac 1:1 Mag 1.9 on 12/1.  Will order Magnesium sulfate 2 gm IV x1 on 12/2 Add MVI, trace elements, chromium, zinc in TPN Continue CBG/SSI for Cyclic TPN:  -CBG 2 hrs after cyclic TPN start -CBG during middle of cyclic TPN infusion -CBG 1 hr after cylic TPN stopped -CBG while off TPN Continue resistant (=4 CBGs per 24 hr period) SSI + 20u insulin in TPN Check TPN labs on Mon/Thurs at minimum Monitor for further need of diuresis and adjust as needed for fluid status  Melroy Bougher A   08/09/2022 10:56 AM

## 2022-08-10 ENCOUNTER — Encounter: Payer: Self-pay | Admitting: Surgery

## 2022-08-10 ENCOUNTER — Inpatient Hospital Stay: Payer: Medicaid Other

## 2022-08-10 LAB — GLUCOSE, CAPILLARY
Glucose-Capillary: 133 mg/dL — ABNORMAL HIGH (ref 70–99)
Glucose-Capillary: 104 mg/dL — ABNORMAL HIGH (ref 70–99)
Glucose-Capillary: 115 mg/dL — ABNORMAL HIGH (ref 70–99)
Glucose-Capillary: 170 mg/dL — ABNORMAL HIGH (ref 70–99)
Glucose-Capillary: 180 mg/dL — ABNORMAL HIGH (ref 70–99)

## 2022-08-10 MED ORDER — ZINC CHLORIDE 1 MG/ML IV SOLN
INTRAVENOUS | Status: AC
  Filled 2022-08-10: qty 681.33

## 2022-08-10 MED ORDER — IOHEXOL 9 MG/ML PO SOLN
500.0000 mL | ORAL | Status: AC
  Administered 2022-08-10: 500 mL via ORAL

## 2022-08-10 MED ORDER — IOHEXOL 300 MG/ML  SOLN
100.0000 mL | Freq: Once | INTRAMUSCULAR | Status: AC | PRN
  Administered 2022-08-10: 100 mL via INTRAVENOUS

## 2022-08-10 NOTE — Progress Notes (Signed)
Mobility Specialist - Progress Note    08/10/22 1555  Mobility  Activity Ambulated with assistance in hallway;Stood at bedside  Level of Assistance Standby assist, set-up cues, supervision of patient - no hands on  Assistive Device Front wheel walker  Distance Ambulated (ft) 160 ft  Activity Response Tolerated well  Mobility Referral Yes  $Mobility charge 1 Mobility   Pt resting in recliner on RA upon entry. Pt STS and ambulates in hallway around NS SBA with a close chair follow. Pt still has a bit of abdom. Pain but, denied pain meds from RN. Pt returned to room to use bathroom. Pt returned to recliner and left with needs in reach.   Loma Sender Mobility Specialist 08/10/22, 4:00 PM

## 2022-08-10 NOTE — Progress Notes (Signed)
EC fistula.  Having suprapubic pain.  She did have a little bit of bleeding that promptly stopped.  She was able to get out of bed to a chair.  I ordered CT scan and personally reviewed the CT scan today without any acute findings.  The L1 fracture is stable and the officials are still there but there is no evidence of any abscess or any other acute intra-abdominal pathology. For now we will continue for TPN and [name] pouch.

## 2022-08-10 NOTE — Progress Notes (Signed)
PT Cancellation Note  Patient Details Name: Debra [name] MRN: [id number] DOB: 1965-12-02   Cancelled Treatment:    Reason Eval/Treat Not Completed: Other (comment) PT orders received. Per MD, pt not mobilizing as often 2/2 suprapubic pain. Informed MD that as long as pt is moving at same level, this does not warrant new PT evaluation. Requested mobility specialist walk with pt. Nurse also reports pt ambulated to the bathroom with walker with decreased gait speed, which is her baseline. Will complete current orders at this time.  Lavone Nian, PT, DPT 08/10/22, 11:42 AM   Waunita Schooner 08/10/2022, 11:40 AM

## 2022-08-10 NOTE — Plan of Care (Signed)
  Problem: Nutrition: Goal: Adequate nutrition will be maintained Outcome: Progressing   Problem: Activity: Goal: Risk for activity intolerance will decrease Outcome: Progressing   Problem: Nutrition: Goal: Adequate nutrition will be maintained Outcome: Progressing   Problem: Pain Managment: Goal: General experience of comfort will improve Outcome: Progressing

## 2022-08-10 NOTE — Progress Notes (Signed)
Went to empty [name]'s pouch after last med pass but patient had already emptied an unknown amount into the toilet. Unable to chart output.

## 2022-08-10 NOTE — Progress Notes (Signed)
PHARMACY - TOTAL PARENTERAL NUTRITION CONSULT NOTE   Indication: Prolonged ileus  Patient Measurements: Height: '4\' 11"'$  (149.9 cm) Weight: 94.3 kg (207 lb 14.3 oz) IBW/kg (Calculated) : 43.2 TPN AdjBW (KG): 55.2 Body mass index is 41.99 kg/m.  Assessment: Debra [name] is a 56 y.o. female s/p laparotomy, excision of greater omental mass, abdominal wall reconstruction with [name], appendectomy, and placement of Prevena vac.  Glucose / Insulin:  --No apparent history of diabetes --rSSI 4x/day + 20u insulin in TPN --24h BG: 106-139 --SSI: 9u last 24h Electrolytes: Within normal limits Renal: Scr at approximate baseline  Hepatic: LFTs trending up slightly GI Imaging: 9/11 CTAP: no new acute issues GI Surgeries / Procedures: s/p laparotomy, excision of greater omental mass, abdominal wall reconstruction with [name], appendectomy, and placement of Prevena vac  Central access: 02/15/22 TPN start date: 02/15/22  Nutritional Goals: Goal cyclic TPN over 16 hrs: cyclic TPN over 16 hrs: (provides 102 g of protein and 1817 kcals per day) total volume over 16hrs for calculations=1400 ml (1500 ml total with overfill)  RD Assessment:  Estimated Needs Total Energy Estimated Needs: 1800-2100kcal/day Total Protein Estimated Needs: 90-110g/d Total Fluid Estimated Needs: 1.4-1.6L/day  Current Nutrition:  Soft diet + nutritional supplements, not meeting PO needs yet   Plan: Transitioned to *Cyclic* TPN on 6/75.   to run over 16 hours per MD request. [name] at 2000 per discussion with Dietician to allow patient time for walking in afternoon/evenings.  To run over 16 hours: -Start rate at 49 mL/hr for 1 hour. -Increase rate to 98 mL/hr for 13 hours.  -Decrease rate to 49 mL/hr for 1 hour. -Decrease rate to 25 mL/hr for 1 hours, then stop.  Plan:  See cyclic TPN rate above Continue Nutritional Components Amino acids (using 15% Clinisol): 102 g Dextrose 19%  = 266 g Lipids (using 20% SMOFlipids): 50.4 g kCal: 1817/24h  Electrolytes in TPN: Na 2mq/L, K 420m/L, Ca 23m43mL, Mg 10 mEq/L, Phos 10 mmol/L, Cl:Ac 1:1 Add MVI, trace elements, chromium, zinc in TPN Continue CBG/SSI for Cyclic TPN:  -CBG 2 hrs after cyclic TPN start -CBG during middle of cyclic TPN infusion -CBG 1 hr after cylic TPN stopped -CBG while off TPN Continue resistant (=4 CBGs per 24 hr period) SSI + 20u insulin in TPN Check TPN labs on Mon/Thurs at minimum Monitor for further need of diuresis and adjust as needed for fluid status  Cheron Coryell A   08/10/2022 9:34 AM

## 2022-08-11 ENCOUNTER — Encounter: Payer: Self-pay | Admitting: Oncology

## 2022-08-11 LAB — COMPREHENSIVE METABOLIC PANEL
ALT: 34 U/L (ref 0–44)
AST: 34 U/L (ref 15–41)
Albumin: 2.6 g/dL — ABNORMAL LOW (ref 3.5–5.0)
Alkaline Phosphatase: 218 U/L — ABNORMAL HIGH (ref 38–126)
Anion gap: 3 — ABNORMAL LOW (ref 5–15)
BUN: 22 mg/dL — ABNORMAL HIGH (ref 6–20)
CO2: 25 mmol/L (ref 22–32)
Calcium: 8.3 mg/dL — ABNORMAL LOW (ref 8.9–10.3)
Chloride: 105 mmol/L (ref 98–111)
Creatinine, Ser: 0.42 mg/dL — ABNORMAL LOW (ref 0.44–1.00)
GFR, Estimated: >60 mL/min (ref >60)
Glucose, Bld: 158 mg/dL — ABNORMAL HIGH (ref 70–99)
Potassium: 4.5 mmol/L (ref 3.5–5.1)
Sodium: 133 mmol/L — ABNORMAL LOW (ref 135–145)
Total Bilirubin: 0.5 mg/dL (ref 0.3–1.2)
Total Protein: 6.8 g/dL (ref 6.5–8.1)

## 2022-08-11 LAB — GLUCOSE, CAPILLARY
Glucose-Capillary: 105 mg/dL — ABNORMAL HIGH (ref 70–99)
Glucose-Capillary: 125 mg/dL — ABNORMAL HIGH (ref 70–99)
Glucose-Capillary: 136 mg/dL — ABNORMAL HIGH (ref 70–99)
Glucose-Capillary: 169 mg/dL — ABNORMAL HIGH (ref 70–99)

## 2022-08-11 LAB — CBC
HCT: 24.1 % — ABNORMAL LOW (ref 36.0–46.0)
Hemoglobin: 7.8 g/dL — ABNORMAL LOW (ref 12.0–15.0)
MCH: 28.1 pg (ref 26.0–34.0)
MCHC: 32.4 g/dL (ref 30.0–36.0)
MCV: 86.7 fL (ref 80.0–100.0)
Platelets: 138 10*3/uL — ABNORMAL LOW (ref 150–400)
RBC: 2.78 MIL/uL — ABNORMAL LOW (ref 3.87–5.11)
RDW: 16.6 % — ABNORMAL HIGH (ref 11.5–15.5)
WBC: 4.8 10*3/uL (ref 4.0–10.5)
nRBC: 0 % (ref 0.0–0.2)

## 2022-08-11 LAB — MAGNESIUM: Magnesium: 2 mg/dL (ref 1.7–2.4)

## 2022-08-11 LAB — PHOSPHORUS: Phosphorus: 3 mg/dL (ref 2.5–4.6)

## 2022-08-11 MED ORDER — PREGABALIN 50 MG PO CAPS
100.0000 mg | ORAL_CAPSULE | Freq: Three times a day (TID) | ORAL | Status: DC
  Administered 2022-08-11 – 2022-09-29 (×141): 100 mg via ORAL
  Filled 2022-08-11 (×144): qty 2

## 2022-08-11 MED ORDER — ZINC CHLORIDE 1 MG/ML IV SOLN
INTRAVENOUS | Status: AC
  Filled 2022-08-11: qty 681.33

## 2022-08-11 NOTE — Progress Notes (Signed)
[facility] SURGICAL PROGRESS NOTE (cpt [phone])  Hospital Day(s): 179.   Post op day(s): 177 Days Post-Op.   Interval History:  Patient seen and examined [name] changed 12/01 by [place] RN; appreciate help [name] output 200 ccs + unmeasured; she is starting to manage effluent independently   She remains without leukocytosis; 4.8K Hgb to 7.8 Renal function normal; sCr - 0.48; UO - unmeasured No significant electrolyte derangements  She is on soft diet; on cyclic TPN Ambulating better; progressed with PT; no longer with any recommendations   Review of Systems:  Constitutional: denies fever, chills  HEENT: denies cough or congestion  Respiratory: denies any shortness of breath  Cardiovascular: denies chest pain or palpitations  Gastrointestinal: denies abdominal pain, N/V Genitourinary: denies burning with urination or urinary frequency Integumentary: + midline wound (stable) MSK: + Back Pain (improved)  Vital signs in last 24 hours: [min-max] current  Temp:  [97.9 F (36.6 C)-98.6 F (37 C)] 97.9 F (36.6 C) (12/04 0450) Pulse Rate:  [75-83] 75 (12/04 0450) Resp:  [15-18] 15 (12/04 0450) BP: (88-107)/(54-64) 88/54 (12/04 0450) SpO2:  [99 %-100 %] 99 % (12/04 0450) Weight:  [94.3 kg] 94.3 kg (12/04 0500)     Height: '4\' 11"'$  (149.9 cm) Weight: 94.3 kg BMI (Calculated): 43.04   Intake/Output last 2 shifts:  12/03 0701 - 12/04 0700 In: 1581.5 [P.O.:600; I.V.:671.5] Out: 202 [Urine:2; Drains:200]   Physical Exam:  Constitutional: alert, cooperative and no distress  Respiratory: breathing non-labored at rest  Cardiovascular: regular rate and sinus rhythm  Gastrointestinal: Soft, abdominal soreness on the right, non-distended, no rebound/guarding. Integumentary: Midline wound open, peritoneum closed; granulating; there are three stomatized areas visible in the LUQ and LLQ portions of the wound, output remains feculent. There is hypergranulation tissue to the inferior  wound edge.   Labs:     Latest Ref Rng & Units 08/11/2022    4:23 AM 08/01/2022    4:29 AM 07/13/2022    7:05 AM  CBC  WBC 4.0 - 10.5 K/uL 4.8  5.7  4.5   Hemoglobin 12.0 - 15.0 g/dL 7.8  8.4  9.9   Hematocrit 36.0 - 46.0 % 24.1  25.7  31.1   Platelets 150 - 400 K/uL 138  137  115       Latest Ref Rng & Units 08/11/2022    4:23 AM 08/07/2022    5:34 AM 08/04/2022    4:30 AM  CMP  Glucose 70 - 99 mg/dL 158  154  157   BUN 6 - 20 mg/dL '22  22  25   '$ Creatinine 0.44 - 1.00 mg/dL 0.42  0.50  0.48   Sodium 135 - 145 mmol/L 133  133  132   Potassium 3.5 - 5.1 mmol/L 4.5  4.9  4.7   Chloride 98 - 111 mmol/L 105  108  104   CO2 22 - 32 mmol/L '25  21  23   '$ Calcium 8.9 - 10.3 mg/dL 8.3  8.3  8.6   Total Protein 6.5 - 8.1 g/dL 6.8  6.8  6.9   Total Bilirubin 0.3 - 1.2 mg/dL 0.5  0.4  0.5   Alkaline Phos 38 - 126 U/L 218  246  283   AST 15 - 41 U/L 34  40  52   ALT 0 - 44 U/L 34  39  48     Imaging studies: No new pertinent imaging studies  Assessment/Plan:  56 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   - New 12/04: No new issues this morning  - Wound Care; Last change 12/01; [name] pouch with "window" but not on intermittent or continuous suction. Patient seems to like this better and has started to manage effluent independently. She will need intermittent suction with Yankauer to control effluent. Likely be beneficial to suction before ambulation.   - Continue TPN; cyclic; monitor nutritional labs bi-weekly - Appreciate dietary assistance with trace elements; iron supplementation            - Monitor abdominal examination; on-going bowel function            - Pain control prn; antemetic prn            - Progressed  with therapies; no longer any recommendations  - Appreciate neurology assistance with compression fracture; serial XRs   - Discharge Planning: Continue to anticipate lengthy admission and potential eventual transfer; no new progress regarding disposition    All of the above findings and recommendations were discussed with the patient, and the medical team, and all of patient's questions were answered to her expressed satisfaction.  -- [name], PA-C [facility] 08/11/2022, 7:42 AM M-F: 7am - 4pm

## 2022-08-11 NOTE — Progress Notes (Signed)
Mobility Specialist - Progress Note    08/11/22 1203  Mobility  Activity Ambulated independently in hallway  Level of Assistance Modified independent, requires aide device or extra time  Assistive Device None  Distance Ambulated (ft) 40 ft  Activity Response Tolerated well  Mobility Referral Yes  $Mobility charge 1 Mobility   Pt ambulating in room upon entry, utilizing RA. Pt ambulated 40 ft in the hallway without AD, tolerated well. Pt returned to room, left sitting in recliner with needs within reach.   [place]ie [place] Mobility Specialist 08/11/22 12:05 PM

## 2022-08-11 NOTE — Plan of Care (Signed)
  Problem: Activity: Goal: Risk for activity intolerance will decrease Outcome: Progressing   Problem: Nutrition: Goal: Adequate nutrition will be maintained Outcome: Progressing   Problem: Pain Managment: Goal: General experience of comfort will improve Outcome: Progressing   

## 2022-08-11 NOTE — Progress Notes (Signed)
PHARMACY - TOTAL PARENTERAL NUTRITION CONSULT NOTE   Indication: Prolonged ileus  Patient Measurements: Height: '4\' 11"'$  (149.9 cm) Weight: 94.3 kg (207 lb 14.3 oz) IBW/kg (Calculated) : 43.2 TPN AdjBW (KG): 55.2 Body mass index is 41.99 kg/m.  Assessment: Debra [name] is a 56 y.o. female s/p laparotomy, excision of greater omental mass, abdominal wall reconstruction with [name], appendectomy, and placement of Prevena vac.  Glucose / Insulin:  --No apparent history of diabetes --rSSI 4x/day + 20u insulin in TPN --24h BG: 106-139 --SSI: 8u last 24h Electrolytes: Within normal limits Renal: Scr at approximate baseline  Hepatic: LFTs trending up slightly GI Imaging: 9/11 CTAP: no new acute issues GI Surgeries / Procedures: s/p laparotomy, excision of greater omental mass, abdominal wall reconstruction with [name], appendectomy, and placement of Prevena vac  Central access: 02/15/22 TPN start date: 02/15/22  Nutritional Goals: Goal cyclic TPN over 16 hrs: cyclic TPN over 16 hrs: (provides 102 g of protein and 1817 kcals per day) total volume over 16hrs for calculations=1400 ml (1500 ml total with overfill)  RD Assessment:  Estimated Needs Total Energy Estimated Needs: 1800-2100kcal/day Total Protein Estimated Needs: 90-110g/d Total Fluid Estimated Needs: 1.4-1.6L/day  Current Nutrition:  Soft diet + nutritional supplements, not meeting PO needs yet   Plan: Transitioned to *Cyclic* TPN on 4/96.   to run over 16 hours per MD request. [name] at 2000 per discussion with Dietician to allow patient time for walking in afternoon/evenings.  To run over 16 hours: -Start rate at 49 mL/hr for 1 hour. -Increase rate to 98 mL/hr for 13 hours.  -Decrease rate to 49 mL/hr for 1 hour. -Decrease rate to 25 mL/hr for 1 hours, then stop.  Plan:  See cyclic TPN rate above Continue Nutritional Components Amino acids (using 15% Clinisol): 102 g Dextrose 19%  = 266 g Lipids (using 20% SMOFlipids): 50.4 g kCal: 1817/24h  Electrolytes in TPN: Na 44mq/L, K 457m/L, Ca 33m28mL, Mg 10 mEq/L, Phos 10 mmol/L, Cl:Ac 1:1 Add MVI, trace elements, chromium, zinc in TPN Continue CBG/SSI for Cyclic TPN:  -CBG 2 hrs after cyclic TPN start -CBG during middle of cyclic TPN infusion -CBG 1 hr after cylic TPN stopped -CBG while off TPN Continue resistant (=4 CBGs per 24 hr period) SSI + 20u insulin in TPN Check TPN labs on Mon/Thurs at minimum Monitor for further need of diuresis and adjust as needed for fluid status  Jeffery Gammell A Errik Mitchelle   08/11/2022 9:46 AM

## 2022-08-12 LAB — GLUCOSE, CAPILLARY
Glucose-Capillary: 146 mg/dL — ABNORMAL HIGH (ref 70–99)
Glucose-Capillary: 104 mg/dL — ABNORMAL HIGH (ref 70–99)
Glucose-Capillary: 165 mg/dL — ABNORMAL HIGH (ref 70–99)

## 2022-08-12 MED ORDER — ZINC CHLORIDE 1 MG/ML IV SOLN
INTRAVENOUS | Status: AC
  Filled 2022-08-12: qty 681.33

## 2022-08-12 NOTE — Progress Notes (Signed)
[facility] SURGICAL PROGRESS NOTE (cpt [phone])  Hospital Day(s): 180.   Post op day(s): 178 Days Post-Op.   Interval History:  Patient seen and examined [name] changed 12/01 by [place] RN; appreciate help [name] output 200 ccs + unmeasured; she is starting to manage effluent independently   No new labs; continues with biweekly nutritional labs  She is on soft diet; on cyclic TPN Ambulating better; progressed with PT; no longer with any recommendations   Review of Systems:  Constitutional: denies fever, chills  HEENT: denies cough or congestion  Respiratory: denies any shortness of breath  Cardiovascular: denies chest pain or palpitations  Gastrointestinal: denies abdominal pain, N/V Genitourinary: denies burning with urination or urinary frequency Integumentary: + midline wound (stable)   Vital signs in last 24 hours: [min-max] current  Temp:  [97.8 F (36.6 C)-98.6 F (37 C)] 98.6 F (37 C) (12/05 0415) Pulse Rate:  [72-78] 78 (12/05 0415) Resp:  [16-20] 20 (12/05 0415) BP: (92-105)/(54-69) 100/54 (12/05 0415) SpO2:  [98 %-100 %] 98 % (12/05 0415)     Height: '4\' 11"'$  (149.9 cm) Weight: 94.3 kg BMI (Calculated): 43.04   Intake/Output last 2 shifts:  12/04 0701 - 12/05 0700 In: 0  Out: 300 [Drains:300]   Physical Exam:  Constitutional: alert, cooperative and no distress  Respiratory: breathing non-labored at rest  Cardiovascular: regular rate and sinus rhythm  Gastrointestinal: Soft, abdominal soreness on the right, non-distended, no rebound/guarding. Integumentary: Midline wound open, peritoneum closed; granulating; there are three stomatized areas visible in the LUQ and LLQ portions of the wound, output remains feculent. There is hypergranulation tissue to the inferior wound edge.    Labs:     Latest Ref Rng & Units 08/11/2022    4:23 AM 08/01/2022    4:29 AM 07/13/2022    7:05 AM  CBC  WBC 4.0 - 10.5 K/uL 4.8  5.7  4.5   Hemoglobin 12.0 - 15.0 g/dL  7.8  8.4  9.9   Hematocrit 36.0 - 46.0 % 24.1  25.7  31.1   Platelets 150 - 400 K/uL 138  137  115       Latest Ref Rng & Units 08/11/2022    4:23 AM 08/07/2022    5:34 AM 08/04/2022    4:30 AM  CMP  Glucose 70 - 99 mg/dL 158  154  157   BUN 6 - 20 mg/dL '22  22  25   '$ Creatinine 0.44 - 1.00 mg/dL 0.42  0.50  0.48   Sodium 135 - 145 mmol/L 133  133  132   Potassium 3.5 - 5.1 mmol/L 4.5  4.9  4.7   Chloride 98 - 111 mmol/L 105  108  104   CO2 22 - 32 mmol/L '25  21  23   '$ Calcium 8.9 - 10.3 mg/dL 8.3  8.3  8.6   Total Protein 6.5 - 8.1 g/dL 6.8  6.8  6.9   Total Bilirubin 0.3 - 1.2 mg/dL 0.5  0.4  0.5   Alkaline Phos 38 - 126 U/L 218  246  283   AST 15 - 41 U/L 34  40  52   ALT 0 - 44 U/L 34  39  48     Imaging studies: No new pertinent imaging studies  Assessment/Plan:  56 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   - New 12/05: No new issues this morning  - Wound Care; Last change 12/01; [name] pouch with "window" but not on intermittent or continuous suction. Patient seems to like this better and has started to manage effluent independently. She will need intermittent suction with Yankauer to control effluent. Likely be beneficial to suction before ambulation.   - Continue TPN; cyclic; monitor nutritional labs bi-weekly - Appreciate dietary assistance with trace elements; iron supplementation            - Monitor abdominal examination; on-going bowel function            - Pain control prn; antemetic prn            - Progressed with therapies; no longer any recommendations  - Appreciate neurology assistance with compression fracture; serial XRs   - Discharge Planning: Continue to anticipate lengthy admission and  potential eventual transfer; no new progress regarding disposition    All of the above findings and recommendations were discussed with the patient, and the medical team, and all of patient's questions were answered to her expressed satisfaction.  -- Debra Simon, PA-C [facility] 08/12/2022, 7:28 AM M-F: 7am - 4pm

## 2022-08-12 NOTE — Progress Notes (Addendum)
PHARMACY - TOTAL PARENTERAL NUTRITION CONSULT NOTE   Indication: Prolonged ileus  Patient Measurements: Height: '4\' 11"'$  (149.9 cm) Weight: 94.3 kg (207 lb 14.3 oz) IBW/kg (Calculated) : 43.2 TPN AdjBW (KG): 55.2 Body mass index is 41.99 kg/m.  Assessment: Debra [name] is a 56 y.o. female s/p laparotomy, excision of greater omental mass, abdominal wall reconstruction with [name], appendectomy, and placement of Prevena vac.  Glucose / Insulin:  --No apparent history of diabetes --rSSI 4x/day + 20u insulin in TPN --24h BG: <180 --SSI: 6u last 24h Electrolytes: Within normal limits Renal: Scr at approximate baseline  Hepatic: LFTs trending up slightly GI Imaging: 9/11 CTAP: no new acute issues GI Surgeries / Procedures: s/p laparotomy, excision of greater omental mass, abdominal wall reconstruction with [name], appendectomy, and placement of Prevena vac  Central access: 02/15/22 TPN start date: 02/15/22  Nutritional Goals: Goal cyclic TPN over 16 hrs: cyclic TPN over 16 hrs: (provides 102 g of protein and 1817 kcals per day) total volume over 16hrs for calculations=1400 ml (1500 ml total with overfill)  RD Assessment:  Estimated Needs Total Energy Estimated Needs: 1800-2100kcal/day Total Protein Estimated Needs: 90-110g/d Total Fluid Estimated Needs: 1.4-1.6L/day  Current Nutrition:  Soft diet + nutritional supplements, not meeting PO needs yet   Plan: Transitioned to *Cyclic* TPN on 6/57.   to run over 16 hours per MD request. [name] at 2000 per discussion with Dietician to allow patient time for walking in afternoon/evenings.  To run over 16 hours: -Start rate at 49 mL/hr for 1 hour. -Increase rate to 98 mL/hr for 13 hours.  -Decrease rate to 49 mL/hr for 1 hour. -Decrease rate to 25 mL/hr for 1 hours, then stop.  Plan:  See cyclic TPN rate above Continue Nutritional Components Amino acids (using 15% Clinisol): 102 g Dextrose 19% =  266 g Lipids (using 20% SMOFlipids): 50.4 g kCal: 1817/24h  Electrolytes in TPN: Na 4mq/L, K 466m/L, Ca 55m70mL, Mg 10 mEq/L, Phos 10 mmol/L, Cl:Ac 1:1 Add MVI, trace elements, chromium, zinc in TPN Will hold hold MVI, trace elements, chromium and zinc for tomorrow per dietary recs. Continue CBG/SSI for Cyclic TPN:  -CBG 2 hrs after cyclic TPN start -CBG during middle of cyclic TPN infusion -CBG 1 hr after cylic TPN stopped -CBG while off TPN Continue resistant (=4 CBGs per 24 hr period) SSI + 20u insulin in TPN Check TPN labs on Mon/Thurs at minimum Monitor for further need of diuresis and adjust as needed for fluid status  AndDarrick Penna12/01/2022 8:30 AM

## 2022-08-12 NOTE — Progress Notes (Addendum)
Nutrition Follow-up  DOCUMENTATION CODES:   Obesity unspecified  INTERVENTION:   Continue cyclic TPN per pharmacy (16 hrs)- provides 1817kcal/day and 102g/day protein   Ensure Enlive po TID, each supplement provides 350 kcal and 20 grams of protein.  MVI, chromium and trace elements in TPN   Daily weights   Vitamin D2-  50,000 units po weekly   Zing 23m daily added to TPN   Check chromium, iodine, copper, selenium, manganese, iron, TIBC, ferritin, folate, B12, B1, vitamin D, vitamin A, vitamin K, B6, C, E and zinc labs 12/7.    NUTRITION DIAGNOSIS:   Increased nutrient needs related to wound healing, catabolic illness as evidenced by estimated needs.  GOAL:   Patient will meet greater than or equal to 90% of their needs -met with TPN   MONITOR:   PO intake, Supplement acceptance, Labs, Weight trends, Diet advancement, I & O's, TPN  ASSESSMENT:   56y/o female with h/o hypothyroidism, COVID 19 (3/21), kidney stones and stage 3 colon cancer (s/p left hemicolectomy 5/21 and chemotherapy) who is admitted for new pelvic mass now s/p laparotomy 6/8 (with excision of pelvic mass from greater omentum, abdominal wall reconstruction with bilateral myocutaneous flaps and mesh, incisional hernia repair, appendectomy repair and VAC placement) complicated by bowel perforation s/p reopening of recent laparotomy 6/10 (with repair of small bowel perforation, excision of mesh, placement of two phasix mesh and VAC placement). Pathology returned as metastatic adenocarcinoma. Pt with L1 compression fracture.   Pt continues to tolerate TPN well at goal rate; TPN is currently being cycled for 16 hours. Triglycerides wnl and are being checked monthly. Hyperglycemia improved with insulin in TPN. Volume status improved with diuresis; pt remains up ~10lbs from admission. Will recheck vitamin labs 12/7 and then every other month once normal. Pt continues to have poor oral intake. [name] pouch remains in  place with 2041moutput. No discharge plan in place.   Medications reviewed and include: D2, insulin, synthroid, protonix, carafate  -Selenium 105 wnl, Manganese 25.6(H), copper 121, Zinc 69 - 11/2 -Chromium 1.4 wnl, Iodine 57.9 wnl- 10/2 -Iron 31.0 wnl, TIBC 360, ferritin 33 wnl, transferrin 272, folate 10.5 wnl, B12 291- 10/16 -Vitamin B1- 117.2, vitamin D 19.46(L), vitamin A 14.3(L), B6 11.9 wnl, vitamin C 0.8, vitamin E 13.4 wnl, vitamin K 0.64 wnl- 10/16  Labs reviewed: Na 133(L), K 4.5 wnl, BUN 22(H), P 3.0 wnl, Mg 2.0 wnl- 12/4 Triglycerides- 84-11/15 Hgb 7.8(L), Hct 24.1(L) Cbgs- 146, 125, 105, 136, 169 x 48 hrs  Diet Order:    Diet Order             DIET SOFT Room service appropriate? Yes; Fluid consistency: Thin  Diet effective now                  EDUCATION NEEDS:   Not appropriate for education at this time  Skin:  Skin Assessment: Reviewed RN Assessment ( Midline wound: 11cm x 12cm )  Last BM:  12/4- type 7  Height:   Ht Readings from Last 1 Encounters:  03/01/22 _0  (1.499 m)    Weight:   Wt Readings from Last 1 Encounters:  08/11/22 94.3 kg    Ideal Body Weight:  44.3 kg  BMI:  Body mass index is 41.99 kg/m.  Estimated Nutritional Needs:   Kcal:  1800-2100kcal/day  Protein:  90-110g/d  Fluid:  1.4-1.6L/day  CaKoleen DistanceS, RD, LDN Please refer to [facility] and/or RD on-call/weekend/after hours pager

## 2022-08-13 ENCOUNTER — Encounter: Payer: Self-pay | Admitting: Oncology

## 2022-08-13 LAB — GLUCOSE, CAPILLARY
Glucose-Capillary: 176 mg/dL — ABNORMAL HIGH (ref 70–99)
Glucose-Capillary: 180 mg/dL — ABNORMAL HIGH (ref 70–99)
Glucose-Capillary: 112 mg/dL — ABNORMAL HIGH (ref 70–99)
Glucose-Capillary: 138 mg/dL — ABNORMAL HIGH (ref 70–99)

## 2022-08-13 MED ORDER — STERILE WATER FOR INJECTION IV SOLN
INTRAVENOUS | Status: AC
  Filled 2022-08-13: qty 681.33

## 2022-08-13 NOTE — TOC Progression Note (Signed)
Transition of Care [facility]) - Progression Note    Patient Details  Name: Nohelani Benning MRN: [id number] Date of Birth: 1965-10-26  Transition of [facility] CM/SW Contact  Beverly Sessions, RN Phone Number: 08/13/2022, 11:01 AM  Clinical Narrative:     Medicaid now listed on file.  Message sent to Glenard Haring in registration to determine if it is effective. TOC supervisor included on email   Expected Discharge Plan: [place] Barriers to Discharge: Continued Medical Work up  Expected Discharge Plan and Services Expected Discharge Plan: [place]                                               Social Determinants of Health (SDOH) Interventions    Readmission Risk Interventions     No data to display

## 2022-08-13 NOTE — Progress Notes (Addendum)
[facility] SURGICAL PROGRESS NOTE (cpt [phone])  Hospital Day(s): 181.   Post op day(s): 179 Days Post-Op.   Interval History:  Patient seen and examined Suprapubic pain; improved slightly; not ambulating as much  [name] changed 12/01 by [facility] RN; appreciate help [name] output 150 ccs + unmeasured; she is starting to manage effluent independently   No new labs; continues with biweekly nutritional labs tomorrow (12/07) She is on soft diet; on cyclic TPN Progressed with PT; no longer with any recommendations   Review of Systems:  Constitutional: denies fever, chills  HEENT: denies cough or congestion  Respiratory: denies any shortness of breath  Cardiovascular: denies chest pain or palpitations  Gastrointestinal: + Abdominal pain (suprapubic), denies N/V Genitourinary: denies burning with urination or urinary frequency Integumentary: + midline wound (stable)   Vital signs in last 24 hours: [min-max] current  Temp:  [98.1 F (36.7 C)-98.6 F (37 C)] 98.1 F (36.7 C) (12/06 0539) Pulse Rate:  [76-88] 76 (12/06 0539) Resp:  [16-18] 18 (12/06 0539) BP: (100-109)/(60-71) 100/60 (12/06 0539) SpO2:  [98 %-100 %] 99 % (12/06 0539) Weight:  [95.5 kg] 95.5 kg (12/06 0500)     Height: '4\' 11"'$  (149.9 cm) Weight: 95.5 kg BMI (Calculated): 43.04   Intake/Output last 2 shifts:  12/05 0701 - 12/06 0700 In: 544.5 [I.V.:544.5] Out: 150 [Drains:150]   Physical Exam:  Constitutional: alert, cooperative and no distress  Respiratory: breathing non-labored at rest  Cardiovascular: regular rate and sinus rhythm  Gastrointestinal: Soft, suprapubic discomfort near pubis otherwise non-tender, non-distended, no rebound/guarding. Integumentary: Midline wound open, peritoneum closed; granulating; there are three stomatized areas visible in the LUQ and LLQ portions of the wound, output remains feculent. There is hypergranulation tissue to the inferior wound edge.    Labs:     Latest Ref  Rng & Units 08/11/2022    4:23 AM 08/01/2022    4:29 AM 07/13/2022    7:05 AM  CBC  WBC 4.0 - 10.5 K/uL 4.8  5.7  4.5   Hemoglobin 12.0 - 15.0 g/dL 7.8  8.4  9.9   Hematocrit 36.0 - 46.0 % 24.1  25.7  31.1   Platelets 150 - 400 K/uL 138  137  115       Latest Ref Rng & Units 08/11/2022    4:23 AM 08/07/2022    5:34 AM 08/04/2022    4:30 AM  CMP  Glucose 70 - 99 mg/dL 158  154  157   BUN 6 - 20 mg/dL '22  22  25   '$ Creatinine 0.44 - 1.00 mg/dL 0.42  0.50  0.48   Sodium 135 - 145 mmol/L 133  133  132   Potassium 3.5 - 5.1 mmol/L 4.5  4.9  4.7   Chloride 98 - 111 mmol/L 105  108  104   CO2 22 - 32 mmol/L '25  21  23   '$ Calcium 8.9 - 10.3 mg/dL 8.3  8.3  8.6   Total Protein 6.5 - 8.1 g/dL 6.8  6.8  6.9   Total Bilirubin 0.3 - 1.2 mg/dL 0.5  0.4  0.5   Alkaline Phos 38 - 126 U/L 218  246  283   AST 15 - 41 U/L 34  40  52   ALT 0 - 44 U/L 34  39  48     Imaging studies: No new pertinent imaging studies  Assessment/Plan:  56 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   - New 12/06: Suprapubic pain; improved some; CT reassuring from 12/03. Otherwise, no new issues this morning  - Wound Care; Last change 12/01; [name] pouch with "window" but not on intermittent or continuous suction. Patient seems to like this better and has started to manage effluent independently. She will need intermittent suction with Yankauer to control effluent. Likely be beneficial to suction before ambulation.   - Continue TPN; cyclic; monitor nutritional labs bi-weekly - Appreciate dietary assistance with trace elements; iron supplementation            - Monitor abdominal examination; on-going bowel function            - Pain control prn; antemetic  prn            - Progressed with therapies; no longer any recommendations  - Appreciate neurology assistance with compression fracture; serial XRs   - Discharge Planning: Continue to anticipate lengthy admission and potential eventual transfer; no new progress regarding disposition; question whether or not she is currently insured. TOC note from 11/27 notes Medicaid now "in verification process"   All of the above findings and recommendations were discussed with the patient, and the medical team, and all of patient's questions were answered to her expressed satisfaction.  -- [name], PA-C [facility] 08/13/2022, 7:55 AM M-F: 7am - 4pm

## 2022-08-13 NOTE — Progress Notes (Signed)
PHARMACY - TOTAL PARENTERAL NUTRITION CONSULT NOTE   Indication: Prolonged ileus  Patient Measurements: Height: '4\' 11"'$  (149.9 cm) Weight: 95.5 kg (210 lb 8.6 oz) IBW/kg (Calculated) : 43.2 TPN AdjBW (KG): 55.2 Body mass index is 42.52 kg/m.  Assessment: Debra [name] is a 56 y.o. female s/p laparotomy, excision of greater omental mass, abdominal wall reconstruction with [name], appendectomy, and placement of Prevena vac.  Glucose / Insulin:  --No apparent history of diabetes --rSSI 4x/day + 20u insulin in TPN --24h BG: <180 --SSI: 11u last 24h Electrolytes: Within normal limits Renal: Scr at approximate baseline  Hepatic: LFTs trending up slightly GI Imaging: 9/11 CTAP: no new acute issues GI Surgeries / Procedures: s/p laparotomy, excision of greater omental mass, abdominal wall reconstruction with [name], appendectomy, and placement of Prevena vac  Central access: 02/15/22 TPN start date: 02/15/22  Nutritional Goals: Goal cyclic TPN over 16 hrs: cyclic TPN over 16 hrs: (provides 102 g of protein and 1817 kcals per day) total volume over 16hrs for calculations=1400 ml (1500 ml total with overfill)  RD Assessment:  Estimated Needs Total Energy Estimated Needs: 1800-2100kcal/day Total Protein Estimated Needs: 90-110g/d Total Fluid Estimated Needs: 1.4-1.6L/day  Current Nutrition:  Soft diet + nutritional supplements, not meeting PO needs yet   Plan: Transitioned to *Cyclic* TPN on 8/65.   to run over 16 hours per MD request. [name] at 2000 per discussion with Dietician to allow patient time for walking in afternoon/evenings.  To run over 16 hours: -Start rate at 49 mL/hr for 1 hour. -Increase rate to 98 mL/hr for 13 hours.  -Decrease rate to 49 mL/hr for 1 hour. -Decrease rate to 25 mL/hr for 1 hours, then stop.  Plan:  See cyclic TPN rate above Continue Nutritional Components Amino acids (using 15% Clinisol): 102 g Dextrose 19% =  266 g Lipids (using 20% SMOFlipids): 50.4 g kCal: 1817/24h  Electrolytes in TPN: Na 51mq/L, K 468m/L, Ca 23m62mL, Mg 10 mEq/L, Phos 10 mmol/L, Cl:Ac 1:1 Hold hold MVI, trace elements, chromium and zinc per dietary recs. Continue CBG/SSI for Cyclic TPN:  -CBG 2 hrs after cyclic TPN start -CBG during middle of cyclic TPN infusion -CBG 1 hr after cylic TPN stopped -CBG while off TPN Continue resistant (=4 CBGs per 24 hr period) SSI + 20u insulin in TPN Check TPN labs on Mon/Thurs at minimum Monitor for further need of diuresis and adjust as needed for fluid status  AndDarrick Penna12/02/2022 8:40 AM

## 2022-08-14 LAB — IRON AND TIBC
Iron: 32 ug/dL (ref 28–170)
Saturation Ratios: 10 % — ABNORMAL LOW (ref 10.4–31.8)
TIBC: 330 ug/dL (ref 250–450)
UIBC: 298 ug/dL

## 2022-08-14 LAB — COMPREHENSIVE METABOLIC PANEL
ALT: 38 U/L (ref 0–44)
AST: 42 U/L — ABNORMAL HIGH (ref 15–41)
Albumin: 2.6 g/dL — ABNORMAL LOW (ref 3.5–5.0)
Alkaline Phosphatase: 204 U/L — ABNORMAL HIGH (ref 38–126)
Anion gap: 2 — ABNORMAL LOW (ref 5–15)
BUN: 20 mg/dL (ref 6–20)
CO2: 24 mmol/L (ref 22–32)
Calcium: 8.3 mg/dL — ABNORMAL LOW (ref 8.9–10.3)
Chloride: 108 mmol/L (ref 98–111)
Creatinine, Ser: 0.42 mg/dL — ABNORMAL LOW (ref 0.44–1.00)
GFR, Estimated: >60 mL/min (ref >60)
Glucose, Bld: 158 mg/dL — ABNORMAL HIGH (ref 70–99)
Potassium: 4.5 mmol/L (ref 3.5–5.1)
Sodium: 134 mmol/L — ABNORMAL LOW (ref 135–145)
Total Bilirubin: 0.4 mg/dL (ref 0.3–1.2)
Total Protein: 7.1 g/dL (ref 6.5–8.1)

## 2022-08-14 LAB — FERRITIN: Ferritin: 54 ng/mL (ref 11–307)

## 2022-08-14 LAB — MAGNESIUM: Magnesium: 2 mg/dL (ref 1.7–2.4)

## 2022-08-14 LAB — GLUCOSE, CAPILLARY
Glucose-Capillary: 172 mg/dL — ABNORMAL HIGH (ref 70–99)
Glucose-Capillary: 193 mg/dL — ABNORMAL HIGH (ref 70–99)
Glucose-Capillary: 91 mg/dL (ref 70–99)
Glucose-Capillary: 98 mg/dL (ref 70–99)

## 2022-08-14 LAB — VITAMIN B12: Vitamin B-12: 769 pg/mL (ref 180–914)

## 2022-08-14 LAB — VITAMIN D 25 HYDROXY (VIT D DEFICIENCY, FRACTURES): Vit D, 25-Hydroxy: 47.88 ng/mL (ref 30–100)

## 2022-08-14 LAB — FOLATE: Folate: 14.4 ng/mL (ref >5.9)

## 2022-08-14 LAB — PHOSPHORUS: Phosphorus: 3.2 mg/dL (ref 2.5–4.6)

## 2022-08-14 MED ORDER — ZINC CHLORIDE 1 MG/ML IV SOLN
INTRAVENOUS | Status: AC
  Filled 2022-08-14: qty 681.33

## 2022-08-14 NOTE — Progress Notes (Signed)
PHARMACY - TOTAL PARENTERAL NUTRITION CONSULT NOTE   Indication: Prolonged ileus  Patient Measurements: Height: '4\' 11"'$  (149.9 cm) Weight: 95.5 kg (210 lb 8.6 oz) IBW/kg (Calculated) : 43.2 TPN AdjBW (KG): 55.2 Body mass index is 42.52 kg/m.  Assessment: Debra [name] is a 56 y.o. female s/p laparotomy, excision of greater omental mass, abdominal wall reconstruction with [name], appendectomy, and placement of Prevena vac.  Glucose / Insulin:  --No apparent history of diabetes --rSSI 4x/day + 20u insulin in TPN --24h BG: <180 --SSI: 11u last 24h Electrolytes: Within normal limits Renal: Scr at approximate baseline  Hepatic: LFTs trending up slightly GI Imaging: 9/11 CTAP: no new acute issues GI Surgeries / Procedures: s/p laparotomy, excision of greater omental mass, abdominal wall reconstruction with [name], appendectomy, and placement of Prevena vac  Central access: 02/15/22 TPN start date: 02/15/22  Nutritional Goals: Goal cyclic TPN over 16 hrs: cyclic TPN over 16 hrs: (provides 102 g of protein and 1817 kcals per day) total volume over 16hrs for calculations=1400 ml (1500 ml total with overfill)  RD Assessment:  Estimated Needs Total Energy Estimated Needs: 1800-2100kcal/day Total Protein Estimated Needs: 90-110g/d Total Fluid Estimated Needs: 1.4-1.6L/day  Current Nutrition:  Soft diet + nutritional supplements, not meeting PO needs yet   Plan: Transitioned to *Cyclic* TPN on 9/35.   to run over 16 hours per MD request. [name] at 2000 per discussion with Dietician to allow patient time for walking in afternoon/evenings.  To run over 16 hours: -Start rate at 49 mL/hr for 1 hour. -Increase rate to 98 mL/hr for 13 hours.  -Decrease rate to 49 mL/hr for 1 hour. -Decrease rate to 25 mL/hr for 1 hours, then stop.  Plan:  See cyclic TPN rate above Continue Nutritional Components Amino acids (using 15% Clinisol): 102 g Dextrose 19% =  266 g Lipids (using 20% SMOFlipids): 50.4 g kCal: 1817/24h  Electrolytes in TPN: Na 33mq/L, K 453m/L, Ca 3m73mL, Mg 10 mEq/L, Phos 10 mmol/L, Cl:Ac 1:1 Restart MVI, trace elements, chromium and zinc per dietary recs. Continue CBG/SSI for Cyclic TPN:  -CBG 2 hrs after cyclic TPN start -CBG during middle of cyclic TPN infusion -CBG 1 hr after cylic TPN stopped -CBG while off TPN Continue resistant (=4 CBGs per 24 hr period) SSI + 20u insulin in TPN Check TPN labs on Mon/Thurs at minimum Monitor for further need of diuresis and adjust as needed for fluid status  AndDarrick Penna12/03/2022 7:49 AM

## 2022-08-14 NOTE — Progress Notes (Addendum)
[facility] SURGICAL PROGRESS NOTE (cpt [phone])  Hospital Day(s): 182.   Post op day(s): 180 Days Post-Op.   Interval History:  Patient seen and examined Nothing acute overnight  Still with suprapubic pain; she is wondering if this is from the [name] pouch itself  [name] changed 12/01 by [facility] RN; appreciate help [name] output 350 ccs + unmeasured; she is starting to manage effluent independently   Nutritional labs pending  She is on soft diet; on cyclic TPN Progressed with PT; no longer with any recommendations   Review of Systems:  Constitutional: denies fever, chills  HEENT: denies cough or congestion  Respiratory: denies any shortness of breath  Cardiovascular: denies chest pain or palpitations  Gastrointestinal: + Abdominal pain (suprapubic), denies N/V Genitourinary: denies burning with urination or urinary frequency Integumentary: + midline wound (stable)   Vital signs in last 24 hours: [min-max] current  Temp:  [97.8 F (36.6 C)-97.9 F (36.6 C)] 97.8 F (36.6 C) (12/06 2141) Pulse Rate:  [71-72] 72 (12/06 2141) Resp:  [16-18] 16 (12/06 2141) BP: (102-106)/(60-65) 106/65 (12/06 2141) SpO2:  [100 %] 100 % (12/06 2141)     Height: '4\' 11"'$  (149.9 cm) Weight: 95.5 kg BMI (Calculated): 43.04   Intake/Output last 2 shifts:  12/06 0701 - 12/07 0700 In: 681.9 [I.V.:681.9] Out: 350 [Drains:350]   Physical Exam:  Constitutional: alert, cooperative and no distress  Respiratory: breathing non-labored at rest  Cardiovascular: regular rate and sinus rhythm  Gastrointestinal: Soft, suprapubic discomfort near pubis otherwise non-tender, non-distended, no rebound/guarding. Integumentary: Midline wound open, peritoneum closed; granulating; there are three stomatized areas visible in the LUQ and LLQ portions of the wound, output remains feculent. There is hypergranulation tissue to the inferior wound edge.   Midline Wound (08/14/2022):     Labs:     Latest Ref  Rng & Units 08/11/2022    4:23 AM 08/01/2022    4:29 AM 07/13/2022    7:05 AM  CBC  WBC 4.0 - 10.5 K/uL 4.8  5.7  4.5   Hemoglobin 12.0 - 15.0 g/dL 7.8  8.4  9.9   Hematocrit 36.0 - 46.0 % 24.1  25.7  31.1   Platelets 150 - 400 K/uL 138  137  115       Latest Ref Rng & Units 08/11/2022    4:23 AM 08/07/2022    5:34 AM 08/04/2022    4:30 AM  CMP  Glucose 70 - 99 mg/dL 158  154  157   BUN 6 - 20 mg/dL '22  22  25   '$ Creatinine 0.44 - 1.00 mg/dL 0.42  0.50  0.48   Sodium 135 - 145 mmol/L 133  133  132   Potassium 3.5 - 5.1 mmol/L 4.5  4.9  4.7   Chloride 98 - 111 mmol/L 105  108  104   CO2 22 - 32 mmol/L '25  21  23   '$ Calcium 8.9 - 10.3 mg/dL 8.3  8.3  8.6   Total Protein 6.5 - 8.1 g/dL 6.8  6.8  6.9   Total Bilirubin 0.3 - 1.2 mg/dL 0.5  0.4  0.5   Alkaline Phos 38 - 126 U/L 218  246  283   AST 15 - 41 U/L 34  40  52   ALT 0 - 44 U/L 34  39  48     Imaging studies: No new pertinent imaging studies  Assessment/Plan:  56 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   - New 12/07: [name] changed; still with suprapubic pain; no clear source; CT from 12/03 negative   - Wound Care; Changed this morning (12/07) with bedside RN; Shirlean Schlein pouch with "window" but not on intermittent or continuous suction. Patient seems to like this better and has started to manage effluent independently. She will need intermittent suction with Yankauer to control effluent. Likely be beneficial to suction before ambulation.   - Continue TPN; cyclic; monitor nutritional labs bi-weekly - Appreciate dietary assistance with trace elements; iron supplementation            - Monitor abdominal examination; on-going bowel function            - Pain control  prn; antemetic prn            - Progressed with therapies; no longer any recommendations  - Appreciate neurology assistance with compression fracture; serial XRs   - Discharge Planning: Continue to anticipate lengthy admission and potential eventual transfer; no new progress regarding disposition; question whether or not she is currently insured. TOC note from 11/27 notes Medicaid now "in verification process"   All of the above findings and recommendations were discussed with the patient, and the medical team, and all of patient's questions were answered to her expressed satisfaction.  -- [name], PA-C [facility] 08/14/2022, 7:34 AM M-F: 7am - 4pm

## 2022-08-15 ENCOUNTER — Encounter: Payer: Self-pay | Admitting: Oncology

## 2022-08-15 LAB — GLUCOSE, CAPILLARY
Glucose-Capillary: 153 mg/dL — ABNORMAL HIGH (ref 70–99)
Glucose-Capillary: 180 mg/dL — ABNORMAL HIGH (ref 70–99)
Glucose-Capillary: 161 mg/dL — ABNORMAL HIGH (ref 70–99)
Glucose-Capillary: 91 mg/dL (ref 70–99)
Glucose-Capillary: 152 mg/dL — ABNORMAL HIGH (ref 70–99)

## 2022-08-15 MED ORDER — ZINC CHLORIDE 1 MG/ML IV SOLN
INTRAVENOUS | Status: AC
  Filled 2022-08-15: qty 681.33

## 2022-08-15 NOTE — TOC Progression Note (Signed)
Transition of Care [facility]) - Progression Note    Patient Details  Name: Debra [name] MRN: [id number] Date of Birth: 1966-05-12  [facility] CM/SW Contact  Beverly Sessions, RN Phone Number: 08/15/2022, 2:18 PM  Clinical Narrative:     Have not received a response from registration.  Message sent to Glenard Haring in registration to determine if Medicaid is effective. TOC supervisor included on email   Expected Discharge Plan: [place] Barriers to Discharge: Continued Medical Work up  Expected Discharge Plan and Services Expected Discharge Plan: [place]                                               Social Determinants of Health (SDOH) Interventions    Readmission Risk Interventions     No data to display

## 2022-08-15 NOTE — Progress Notes (Signed)
[facility] SURGICAL PROGRESS NOTE (cpt [phone])  Hospital Day(s): 183.   Post op day(s): 181 Days Post-Op.   Interval History:  Patient seen and examined Nothing acute overnight  Still with suprapubic pain [name] changed 12/07 by myself and bedside RN [name] output 600 ccs + unmeasured; she is starting to manage effluent independently   She is on soft diet; on cyclic TPN Progressed with PT; no longer with any recommendations   Review of Systems:  Constitutional: denies fever, chills  HEENT: denies cough or congestion  Respiratory: denies any shortness of breath  Cardiovascular: denies chest pain or palpitations  Gastrointestinal: + Abdominal pain (suprapubic), denies N/V Genitourinary: denies burning with urination or urinary frequency Integumentary: + midline wound (stable)   Vital signs in last 24 hours: [min-max] current  Temp:  [98 F (36.7 C)-98.4 F (36.9 C)] 98.4 F (36.9 C) (12/08 0339) Pulse Rate:  [72-100] 75 (12/08 0339) Resp:  [17-20] 18 (12/08 0339) BP: (95-108)/(57-64) 102/57 (12/08 0339) SpO2:  [98 %-100 %] 98 % (12/08 0339)     Height: '4\' 11"'$  (149.9 cm) Weight: 95.5 kg BMI (Calculated): 43.04   Intake/Output last 2 shifts:  12/07 0701 - 12/08 0700 In: 60 [P.O.:60] Out: 601 [Urine:1; Drains:600]   Physical Exam:  Constitutional: alert, cooperative and no distress  Respiratory: breathing non-labored at rest  Cardiovascular: regular rate and sinus rhythm  Gastrointestinal: Soft, suprapubic discomfort near pubis otherwise non-tender, non-distended, no rebound/guarding. Integumentary: Midline wound open, peritoneum closed; granulating; there are three stomatized areas visible in the LUQ and LLQ portions of the wound, output remains feculent. There is hypergranulation tissue to the inferior wound edge.     Labs:     Latest Ref Rng & Units 08/11/2022    4:23 AM 08/01/2022    4:29 AM 07/13/2022    7:05 AM  CBC  WBC 4.0 - 10.5 K/uL 4.8  5.7   4.5   Hemoglobin 12.0 - 15.0 g/dL 7.8  8.4  9.9   Hematocrit 36.0 - 46.0 % 24.1  25.7  31.1   Platelets 150 - 400 K/uL 138  137  115       Latest Ref Rng & Units 08/14/2022    5:52 AM 08/11/2022    4:23 AM 08/07/2022    5:34 AM  CMP  Glucose 70 - 99 mg/dL 158  158  154   BUN 6 - 20 mg/dL '20  22  22   '$ Creatinine 0.44 - 1.00 mg/dL 0.42  0.42  0.50   Sodium 135 - 145 mmol/L 134  133  133   Potassium 3.5 - 5.1 mmol/L 4.5  4.5  4.9   Chloride 98 - 111 mmol/L 108  105  108   CO2 22 - 32 mmol/L '24  25  21   '$ Calcium 8.9 - 10.3 mg/dL 8.3  8.3  8.3   Total Protein 6.5 - 8.1 g/dL 7.1  6.8  6.8   Total Bilirubin 0.3 - 1.2 mg/dL 0.4  0.5  0.4   Alkaline Phos 38 - 126 U/L 204  218  246   AST 15 - 41 U/L 42  34  40   ALT 0 - 44 U/L 38  34  39     Imaging studies: No new pertinent imaging studies  Assessment/Plan:  56 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   - New 12/08: No new issues; no obvious source of suprapubic pain; unchanged   - Wound Care; Changed 12/07 with bedside RN; Shirlean Schlein pouch with "window" but not on intermittent or continuous suction. Patient seems to like this better and has started to manage effluent independently. She will need intermittent suction with Yankauer to control effluent. Likely be beneficial to suction before ambulation.   - Continue TPN; cyclic; monitor nutritional labs bi-weekly - Appreciate dietary assistance with trace elements; iron supplementation            - Monitor abdominal examination; on-going bowel function            - Pain control prn; antemetic prn            - Progressed with therapies; no longer any recommendations  - Appreciate neurology assistance with compression fracture;  serial XRs   - Discharge Planning: Continue to anticipate lengthy admission and potential eventual transfer; no new progress regarding disposition; question whether or not she is currently insured. TOC note from 11/27 notes Medicaid now "in verification process"   All of the above findings and recommendations were discussed with the patient, and the medical team, and all of patient's questions were answered to her expressed satisfaction.  -- [name], PA-C [facility] 08/15/2022, 7:37 AM M-F: 7am - 4pm

## 2022-08-15 NOTE — Progress Notes (Signed)
PHARMACY - TOTAL PARENTERAL NUTRITION CONSULT NOTE   Indication: Prolonged ileus  Patient Measurements: Height: '4\' 11"'$  (149.9 cm) Weight: 95.5 kg (210 lb 8.6 oz) IBW/kg (Calculated) : 43.2 TPN AdjBW (KG): 55.2 Body mass index is 42.52 kg/m.  Assessment: Tomorrow Fredia Chittenden is a 56 y.o. female s/p laparotomy, excision of greater omental mass, abdominal wall reconstruction with [name], appendectomy, and placement of Prevena vac.  Glucose / Insulin:  --No apparent history of diabetes --rSSI 4x/day + 20u insulin in TPN --24h BG: <180 --SSI: 8u last 24h Electrolytes: Within normal limits Renal: Scr at approximate baseline  Hepatic: LFTs trending up slightly GI Imaging: 9/11 CTAP: no new acute issues GI Surgeries / Procedures: s/p laparotomy, excision of greater omental mass, abdominal wall reconstruction with [name], appendectomy, and placement of Prevena vac  Central access: 02/15/22 TPN start date: 02/15/22  Nutritional Goals: Goal cyclic TPN over 16 hrs: cyclic TPN over 16 hrs: (provides 102 g of protein and 1817 kcals per day) total volume over 16hrs for calculations=1400 ml (1500 ml total with overfill)  RD Assessment:  Estimated Needs Total Energy Estimated Needs: 1800-2100kcal/day Total Protein Estimated Needs: 90-110g/d Total Fluid Estimated Needs: 1.4-1.6L/day  Current Nutrition:  Soft diet + nutritional supplements, not meeting PO needs yet   Plan: Transitioned to *Cyclic* TPN on 5/36.   to run over 16 hours per MD request. [name] at 2000 per discussion with Dietician to allow patient time for walking in afternoon/evenings.  To run over 16 hours: -Start rate at 49 mL/hr for 1 hour. -Increase rate to 98 mL/hr for 13 hours.  -Decrease rate to 49 mL/hr for 1 hour. -Decrease rate to 25 mL/hr for 1 hours, then stop.  Plan:  See cyclic TPN rate above Continue Nutritional Components Amino acids (using 15% Clinisol): 102 g Dextrose 19% =  266 g Lipids (using 20% SMOFlipids): 50.4 g kCal: 1817/24h  Electrolytes in TPN: Na 53mq/L, K 423m/L, Ca 71m39mL, Mg 10 mEq/L, Phos 10 mmol/L, Cl:Ac 1:1 Continue MVI, trace elements, chromium and zinc per dietary recs. Continue CBG/SSI for Cyclic TPN:  -CBG 2 hrs after cyclic TPN start -CBG during middle of cyclic TPN infusion -CBG 1 hr after cylic TPN stopped -CBG while off TPN Continue resistant (=4 CBGs per 24 hr period) SSI + 20u insulin in TPN Check TPN labs on Mon/Thurs at minimum Monitor for further need of diuresis and adjust as needed for fluid status  AndDarrick Penna12/04/2022 8:54 AM

## 2022-08-16 LAB — GLUCOSE, CAPILLARY
Glucose-Capillary: 163 mg/dL — ABNORMAL HIGH (ref 70–99)
Glucose-Capillary: 158 mg/dL — ABNORMAL HIGH (ref 70–99)
Glucose-Capillary: 110 mg/dL — ABNORMAL HIGH (ref 70–99)
Glucose-Capillary: 160 mg/dL — ABNORMAL HIGH (ref 70–99)

## 2022-08-16 LAB — VITAMIN C: Vitamin C: 0.5 mg/dL (ref 0.4–2.0)

## 2022-08-16 MED ORDER — ZINC CHLORIDE 1 MG/ML IV SOLN
INTRAVENOUS | Status: AC
  Filled 2022-08-16: qty 681.33

## 2022-08-16 NOTE — Progress Notes (Signed)
Subjective:  CC: Debra [name] is a 56 y.o. female  Hospital stay day 184, 182 Days Post-Op s/p re-opening of laparotomy for repair of small bowel perforation following initial laparotomy, excision of greater omental mass, abdominal wall reconstruction with [name], appendectomy, and placement of Prevena vac on 06/08.   HPI: No acute issues overnight.  Patient states she has some intermittent pain right underneath the [name]'s pouch area, worse with walking, controlled with pain meds  ROS:  General: Denies weight loss, weight gain, fatigue, fevers, chills, and night sweats. Heart: Denies chest pain, palpitations, racing heart, irregular heartbeat, leg pain or swelling, and decreased activity tolerance. Respiratory: Denies breathing difficulty, shortness of breath, wheezing, cough, and sputum. GI: Denies change in appetite, heartburn, nausea, vomiting, constipation, diarrhea, and blood in stool. GU: Denies difficulty urinating, pain with urinating, urgency, frequency, blood in urine.   Objective:   Temp:  [98 F (36.7 C)-99.4 F (37.4 C)] 99.4 F (37.4 C) (12/09 0747) Pulse Rate:  [83-92] 84 (12/09 0747) Resp:  [18-20] 18 (12/09 0747) BP: (107-113)/(55-62) 109/62 (12/09 0747) SpO2:  [91 %-98 %] 97 % (12/09 0747)     Height: '4\' 11"'$  (149.9 cm) Weight: 95.5 kg BMI (Calculated): 43.04   Intake/Output this shift:   Intake/Output Summary (Last 24 hours) at 08/16/2022 1229 Last data filed at 08/16/2022 1100 Gross per 24 hour  Intake 1126.37 ml  Output 950 ml  Net 176.37 ml    Constitutional :  alert, cooperative, appears stated age, and no distress  Respiratory:  clear to auscultation bilaterally  Cardiovascular:  regular rate and rhythm  Gastrointestinal: soft, non-tender; bowel sounds normal; no masses,  no organomegaly. [name]'s pouch intact over midline abdominal wound. Wound base remains with no evidence of active bleeding, good granulation tissue.   Skin: Cool  and moist.   Psychiatric: Normal affect, non-agitated, not confused       LABS:     Latest Ref Rng & Units 08/14/2022    5:52 AM 08/11/2022    4:23 AM 08/07/2022    5:34 AM  CMP  Glucose 70 - 99 mg/dL 158  158  154   BUN 6 - 20 mg/dL '20  22  22   '$ Creatinine 0.44 - 1.00 mg/dL 0.42  0.42  0.50   Sodium 135 - 145 mmol/L 134  133  133   Potassium 3.5 - 5.1 mmol/L 4.5  4.5  4.9   Chloride 98 - 111 mmol/L 108  105  108   CO2 22 - 32 mmol/L '24  25  21   '$ Calcium 8.9 - 10.3 mg/dL 8.3  8.3  8.3   Total Protein 6.5 - 8.1 g/dL 7.1  6.8  6.8   Total Bilirubin 0.3 - 1.2 mg/dL 0.4  0.5  0.4   Alkaline Phos 38 - 126 U/L 204  218  246   AST 15 - 41 U/L 42  34  40   ALT 0 - 44 U/L 38  34  39       Latest Ref Rng & Units 08/11/2022    4:23 AM 08/01/2022    4:29 AM 07/13/2022    7:05 AM  CBC  WBC 4.0 - 10.5 K/uL 4.8  5.7  4.5   Hemoglobin 12.0 - 15.0 g/dL 7.8  8.4  9.9   Hematocrit 36.0 - 46.0 % 24.1  25.7  31.1   Platelets 150 - 400 K/uL 138  137  115     RADS: N/a  Assessment:   s/p re-opening of laparotomy for repair of small bowel perforation following initial laparotomy, excision of greater omental mass, abdominal wall reconstruction with [name], appendectomy, and placement of Prevena vac on 06/08.   Despite report of worsening pain, patient is not reporting increased pain meds used for this more report.  Hallux looks intact with no sign of leak or skin irritation that is visible.  Recommend continuing monitor over the weekend and reassess when pouch needs to be changed next week.  labs/images/medications/previous chart entries reviewed personally and relevant changes/updates noted above.

## 2022-08-16 NOTE — Progress Notes (Signed)
PHARMACY - TOTAL PARENTERAL NUTRITION CONSULT NOTE   Indication: Prolonged ileus  Patient Measurements: Height: '4\' 11"'$  (149.9 cm) Weight: 95.5 kg (210 lb 8.6 oz) IBW/kg (Calculated) : 43.2 TPN AdjBW (KG): 55.2 Body mass index is 42.52 kg/m.  Assessment: Debra [name] is a 57 y.o. female s/p laparotomy, excision of greater omental mass, abdominal wall reconstruction with Debra [name] release, appendectomy, and placement of Prevena vac.  Glucose / Insulin:  --No apparent history of diabetes --rSSI 4x/day + 20u insulin in TPN --24h BG: <180 --SSI: 12u last 24h Electrolytes: Within normal limits Renal: Scr at approximate baseline  Hepatic: LFTs trending up slightly GI Imaging: 9/11 CTAP: no new acute issues GI Surgeries / Procedures: s/p laparotomy, excision of greater omental mass, abdominal wall reconstruction with Debra [name] release, appendectomy, and placement of Prevena vac  Central access: 02/15/22 TPN start date: 02/15/22  Nutritional Goals: Goal cyclic TPN over 16 hrs: cyclic TPN over 16 hrs: (provides 102 g of protein and 1817 kcals per day) total volume over 16hrs for calculations=1400 ml (1500 ml total with overfill)  RD Assessment:  Estimated Needs Total Energy Estimated Needs: 1800-2100kcal/day Total Protein Estimated Needs: 90-110g/d Total Fluid Estimated Needs: 1.4-1.6L/day  Current Nutrition:  Soft diet + nutritional supplements, not meeting PO needs yet   Plan: Transitioned to *Cyclic* TPN on 1/61.   to run over 16 hours per MD request. Debra [name] at 2000 per discussion with Dietician to allow patient time for walking in afternoon/evenings.  To run over 16 hours: -Start rate at 49 mL/hr for 1 hour. -Increase rate to 98 mL/hr for 13 hours.  -Decrease rate to 49 mL/hr for 1 hour. -Decrease rate to 25 mL/hr for 1 hours, then stop.  Plan:  See cyclic TPN rate above Continue Nutritional Components Amino acids (using 15% Clinisol): 102 g Dextrose 19% =  266 g Lipids (using 20% SMOFlipids): 50.4 g kCal: 1817/24h  Electrolytes in TPN: Na 3mq/L, K 488m/L, Ca 54m67mL, Mg 10 mEq/L, Phos 10 mmol/L, Cl:Ac 1:1 Continue MVI, trace elements, chromium and zinc per dietary recs. Continue CBG/SSI for Cyclic TPN:  -CBG 2 hrs after cyclic TPN start -CBG during middle of cyclic TPN infusion -CBG 1 hr after cylic TPN stopped -CBG while off TPN Continue resistant (=4 CBGs per 24 hr period) SSI + 20u insulin in TPN Check TPN labs on Mon/Thurs at minimum Monitor for further need of diuresis and adjust as needed for fluid status  Debra Hillock12/05/2022 9:53 AM

## 2022-08-17 LAB — GLUCOSE, CAPILLARY
Glucose-Capillary: 168 mg/dL — ABNORMAL HIGH (ref 70–99)
Glucose-Capillary: 120 mg/dL — ABNORMAL HIGH (ref 70–99)
Glucose-Capillary: 136 mg/dL — ABNORMAL HIGH (ref 70–99)
Glucose-Capillary: 138 mg/dL — ABNORMAL HIGH (ref 70–99)

## 2022-08-17 LAB — MISC LABCORP TEST (SEND OUT): Labcorp test code: 70034

## 2022-08-17 LAB — VITAMIN B6: Vitamin B6: 22.8 ug/L (ref 3.4–65.2)

## 2022-08-17 LAB — VITAMIN E
Vitamin E (Alpha Tocopherol): 12.7 mg/L (ref 7.0–25.1)
Vitamin E(Gamma Tocopherol): 0.9 mg/L (ref 0.5–5.5)

## 2022-08-17 LAB — VITAMIN A: Vitamin A (Retinoic Acid): 21.5 ug/dL (ref 20.1–62.0)

## 2022-08-17 MED ORDER — ZINC CHLORIDE 1 MG/ML IV SOLN: INTRAVENOUS | Status: DC

## 2022-08-17 MED ORDER — ZINC CHLORIDE 1 MG/ML IV SOLN
INTRAVENOUS | Status: AC
  Filled 2022-08-17: qty 681.33

## 2022-08-17 NOTE — Progress Notes (Signed)
Subjective:  CC: Debra [name] is a 56 y.o. female  Hospital stay day 185, 183 Days Post-Op s/p re-opening of laparotomy for repair of small bowel perforation following initial laparotomy, excision of greater omental mass, abdominal wall reconstruction with [name], appendectomy, and placement of Prevena vac on 06/08.   HPI: No acute issues overnight.  Concern of some bleeding in the area today.  No complaints of pain  ROS:  General: Denies weight loss, weight gain, fatigue, fevers, chills, and night sweats. Heart: Denies chest pain, palpitations, racing heart, irregular heartbeat, leg pain or swelling, and decreased activity tolerance. Respiratory: Denies breathing difficulty, shortness of breath, wheezing, cough, and sputum. GI: Denies change in appetite, heartburn, nausea, vomiting, constipation, diarrhea, and blood in stool. GU: Denies difficulty urinating, pain with urinating, urgency, frequency, blood in urine.   Objective:   Temp:  [98.2 F (36.8 C)-98.3 F (36.8 C)] 98.2 F (36.8 C) (12/10 0446) Pulse Rate:  [85-86] 85 (12/10 0446) Resp:  [20] 20 (12/10 0446) BP: (105-108)/(56-59) 105/56 (12/10 0446) SpO2:  [96 %-98 %] 96 % (12/10 0446) Weight:  [94 kg] 94 kg (12/10 0446)     Height: '4\' 11"'$  (149.9 cm) Weight: 94 kg BMI (Calculated): 43.04   Intake/Output this shift:   Intake/Output Summary (Last 24 hours) at 08/17/2022 1541 Last data filed at 08/17/2022 1213 Gross per 24 hour  Intake 1509.96 ml  Output 1175 ml  Net 334.96 ml    Constitutional :  alert, cooperative, appears stated age, and no distress  Respiratory:  clear to auscultation bilaterally  Cardiovascular:  regular rate and rhythm  Gastrointestinal: soft, non-tender; bowel sounds normal; no masses,  no organomegaly. [name]'s pouch intact over midline abdominal wound. Wound base remains clean with no evidence of active bleeding, good granulation tissue.   Skin: Cool and moist.    Psychiatric: Normal affect, non-agitated, not confused       LABS:     Latest Ref Rng & Units 08/14/2022    5:52 AM 08/11/2022    4:23 AM 08/07/2022    5:34 AM  CMP  Glucose 70 - 99 mg/dL 158  158  154   BUN 6 - 20 mg/dL '20  22  22   '$ Creatinine 0.44 - 1.00 mg/dL 0.42  0.42  0.50   Sodium 135 - 145 mmol/L 134  133  133   Potassium 3.5 - 5.1 mmol/L 4.5  4.5  4.9   Chloride 98 - 111 mmol/L 108  105  108   CO2 22 - 32 mmol/L '24  25  21   '$ Calcium 8.9 - 10.3 mg/dL 8.3  8.3  8.3   Total Protein 6.5 - 8.1 g/dL 7.1  6.8  6.8   Total Bilirubin 0.3 - 1.2 mg/dL 0.4  0.5  0.4   Alkaline Phos 38 - 126 U/L 204  218  246   AST 15 - 41 U/L 42  34  40   ALT 0 - 44 U/L 38  34  39       Latest Ref Rng & Units 08/11/2022    4:23 AM 08/01/2022    4:29 AM 07/13/2022    7:05 AM  CBC  WBC 4.0 - 10.5 K/uL 4.8  5.7  4.5   Hemoglobin 12.0 - 15.0 g/dL 7.8  8.4  9.9   Hematocrit 36.0 - 46.0 % 24.1  25.7  31.1   Platelets 150 - 400 K/uL 138  137  115  RADS: N/a Assessment:   s/p re-opening of laparotomy for repair of small bowel perforation following initial laparotomy, excision of greater omental mass, abdominal wall reconstruction with [name], appendectomy, and placement of Prevena vac on 06/08.   Wound base clean with no obvious signs of active bleeding but there is some bloody discharge noted in the pouch output.  Continue to monitor for now.  labs/images/medications/previous chart entries reviewed personally and relevant changes/updates noted above.

## 2022-08-17 NOTE — Progress Notes (Signed)
PHARMACY - TOTAL PARENTERAL NUTRITION CONSULT NOTE   Indication: Prolonged ileus  Patient Measurements: Height: '4\' 11"'$  (149.9 cm) Weight: 94 kg (207 lb 3.7 oz) IBW/kg (Calculated) : 43.2 TPN AdjBW (KG): 55.2 Body mass index is 41.86 kg/m.  Assessment: Debra [name] is a 56 y.o. female s/p laparotomy, excision of greater omental mass, abdominal wall reconstruction with [name], appendectomy, and placement of Prevena vac.  Glucose / Insulin:  --No apparent history of diabetes --rSSI 4x/day + 20u insulin in TPN --24h BG: <180 --SSI: 12u last 24h Electrolytes: Within normal limits Renal: Scr at approximate baseline  Hepatic: LFTs trending up slightly GI Imaging: 9/11 CTAP: no new acute issues GI Surgeries / Procedures: s/p laparotomy, excision of greater omental mass, abdominal wall reconstruction with [name], appendectomy, and placement of Prevena vac  Central access: 02/15/22 TPN start date: 02/15/22  Nutritional Goals: Goal cyclic TPN over 16 hrs: cyclic TPN over 16 hrs: (provides 102 g of protein and 1817 kcals per day) total volume over 16hrs for calculations=1400 ml (1500 ml total with overfill)  RD Assessment:  Estimated Needs Total Energy Estimated Needs: 1800-2100kcal/day Total Protein Estimated Needs: 90-110g/d Total Fluid Estimated Needs: 1.4-1.6L/day  Current Nutrition:  Soft diet + nutritional supplements, not meeting PO needs yet   Plan: Transitioned to *Cyclic* TPN on 7/67.   to run over 16 hours per MD request. [name] at 2000 per discussion with Dietician to allow patient time for walking in afternoon/evenings.  To run over 16 hours: -Start rate at 49 mL/hr for 1 hour. -Increase rate to 98 mL/hr for 13 hours.  -Decrease rate to 49 mL/hr for 1 hour. -Decrease rate to 25 mL/hr for 1 hours, then stop.  Plan:  See cyclic TPN rate above Continue Nutritional Components Amino acids (using 15% Clinisol): 102 g Dextrose 19% =  266 g Lipids (using 20% SMOFlipids): 50.4 g kCal: 1817/24h  Electrolytes in TPN: Na 70mq/L, K 438m/L, Ca 18m418mL, Mg 10 mEq/L, Phos 10 mmol/L, Cl:Ac 1:1 Continue MVI, trace elements, chromium and zinc per dietary recs. Continue CBG/SSI for Cyclic TPN:  -CBG 2 hrs after cyclic TPN start -CBG during middle of cyclic TPN infusion -CBG 1 hr after cylic TPN stopped -CBG while off TPN Continue resistant (=4 CBGs per 24 hr period) SSI + 20u insulin in TPN Check TPN labs on Mon/Thurs at minimum Monitor for further need of diuresis and adjust as needed for fluid status  [name]12/06/2022 9:37 AM

## 2022-08-18 ENCOUNTER — Inpatient Hospital Stay: Payer: Medicaid Other

## 2022-08-18 LAB — COMPREHENSIVE METABOLIC PANEL
ALT: 35 U/L (ref 0–44)
AST: 40 U/L (ref 15–41)
Albumin: 2.5 g/dL — ABNORMAL LOW (ref 3.5–5.0)
Alkaline Phosphatase: 183 U/L — ABNORMAL HIGH (ref 38–126)
Anion gap: 4 — ABNORMAL LOW (ref 5–15)
BUN: 19 mg/dL (ref 6–20)
CO2: 25 mmol/L (ref 22–32)
Calcium: 8.3 mg/dL — ABNORMAL LOW (ref 8.9–10.3)
Chloride: 106 mmol/L (ref 98–111)
Creatinine, Ser: 0.34 mg/dL — ABNORMAL LOW (ref 0.44–1.00)
GFR, Estimated: >60 mL/min (ref >60)
Glucose, Bld: 166 mg/dL — ABNORMAL HIGH (ref 70–99)
Potassium: 4.1 mmol/L (ref 3.5–5.1)
Sodium: 135 mmol/L (ref 135–145)
Total Bilirubin: 0.4 mg/dL (ref 0.3–1.2)
Total Protein: 6.7 g/dL (ref 6.5–8.1)

## 2022-08-18 LAB — GLUCOSE, CAPILLARY
Glucose-Capillary: 155 mg/dL — ABNORMAL HIGH (ref 70–99)
Glucose-Capillary: 102 mg/dL — ABNORMAL HIGH (ref 70–99)
Glucose-Capillary: 100 mg/dL — ABNORMAL HIGH (ref 70–99)
Glucose-Capillary: 149 mg/dL — ABNORMAL HIGH (ref 70–99)

## 2022-08-18 LAB — MISC LABCORP TEST (SEND OUT): Labcorp test code: 81034

## 2022-08-18 LAB — PHOSPHORUS: Phosphorus: 3.3 mg/dL (ref 2.5–4.6)

## 2022-08-18 LAB — MAGNESIUM: Magnesium: 2 mg/dL (ref 1.7–2.4)

## 2022-08-18 MED ORDER — IOHEXOL 9 MG/ML PO SOLN
500.0000 mL | Freq: Once | ORAL | Status: DC | PRN
  Administered 2022-08-18: 500 mL via ORAL

## 2022-08-18 MED ORDER — ZINC CHLORIDE 1 MG/ML IV SOLN
INTRAVENOUS | Status: AC
  Filled 2022-08-18: qty 681.33

## 2022-08-18 MED ORDER — IOHEXOL 300 MG/ML  SOLN
100.0000 mL | Freq: Once | INTRAMUSCULAR | Status: AC | PRN
  Administered 2022-08-18: 100 mL via INTRAVENOUS

## 2022-08-18 NOTE — TOC Progression Note (Signed)
Transition of Care [facility]) - Progression Note    Patient Details  Name: Debra [name] MRN: [id number] Date of Birth: August 14, 1966  [facility] CM/SW Contact  Beverly Sessions, RN Phone Number: 08/18/2022, 2:05 PM  Clinical Narrative:     Listed again as self pay.  Per Rob in registration "The patient has Emergency Medicaid coverage only from 02/13/2022 to 03/17/22.   I updated the effective dates at the insurance plan level and added a note to the [facility] account"   Expected Discharge Plan: [place] Barriers to Discharge: Continued Medical Work up  Expected Discharge Plan and Services Expected Discharge Plan: [place]                                               Social Determinants of Health (SDOH) Interventions    Readmission Risk Interventions     No data to display

## 2022-08-18 NOTE — Progress Notes (Signed)
[facility] SURGICAL PROGRESS NOTE (cpt [id number])  Hospital Day(s): 186.   Post op day(s): 184 Days Post-Op.   Interval History:  Patient seen and examined Nothing acute overnight; weekend events noted; some bleeding but this is stopped [name] changed 12/07 by myself and bedside RN [name] output 1400 ccs + unmeasured; she is starting to manage effluent independently Renal function normal; sCr - 0.34 No electrolyte derangements  She is on soft diet; on cyclic TPN Progressed with PT; no longer with any recommendations   Review of Systems:  Constitutional: denies fever, chills  HEENT: denies cough or congestion  Respiratory: denies any shortness of breath  Cardiovascular: denies chest pain or palpitations  Gastrointestinal: + Abdominal pain, denies N/V Genitourinary: denies burning with urination or urinary frequency Integumentary: + midline wound (stable)   Vital signs in last 24 hours: [min-max] current  Temp:  [98.3 F (36.8 C)] 98.3 F (36.8 C) (12/11 0335) Pulse Rate:  [80-91] 91 (12/11 0335) Resp:  [16-18] 16 (12/11 0335) BP: (93-121)/(59-63) 121/63 (12/11 0335) SpO2:  [99 %] 99 % (12/11 0335) Weight:  [96.1 kg] 96.1 kg (12/11 0335)     Height: '4\' 11"'$  (149.9 cm) Weight: 96.1 kg BMI (Calculated): 43.04   Intake/Output last 2 shifts:  12/10 0701 - 12/11 0700 In: 1973.6 [P.O.:840; I.V.:1133.6] Out: 2000 [Urine:600; Drains:1400]   Physical Exam:  Constitutional: alert, cooperative and no distress  Respiratory: breathing non-labored at rest  Cardiovascular: regular rate and sinus rhythm  Gastrointestinal: Soft, non-distended, no rebound/guarding. Integumentary: Midline wound open, peritoneum closed; granulating; there are three stomatized areas visible in the LUQ and LLQ portions of the wound, output remains feculent. There is hypergranulation tissue to the inferior wound edge.     Labs:     Latest Ref Rng & Units 08/11/2022    4:23 AM 08/01/2022     4:29 AM 07/13/2022    7:05 AM  CBC  WBC 4.0 - 10.5 K/uL 4.8  5.7  4.5   Hemoglobin 12.0 - 15.0 g/dL 7.8  8.4  9.9   Hematocrit 36.0 - 46.0 % 24.1  25.7  31.1   Platelets 150 - 400 K/uL 138  137  115       Latest Ref Rng & Units 08/18/2022    4:27 AM 08/14/2022    5:52 AM 08/11/2022    4:23 AM  CMP  Glucose 70 - 99 mg/dL 166  158  158   BUN 6 - 20 mg/dL '19  20  22   '$ Creatinine 0.44 - 1.00 mg/dL 0.34  0.42  0.42   Sodium 135 - 145 mmol/L 135  134  133   Potassium 3.5 - 5.1 mmol/L 4.1  4.5  4.5   Chloride 98 - 111 mmol/L 106  108  105   CO2 22 - 32 mmol/L '25  24  25   '$ Calcium 8.9 - 10.3 mg/dL 8.3  8.3  8.3   Total Protein 6.5 - 8.1 g/dL 6.7  7.1  6.8   Total Bilirubin 0.3 - 1.2 mg/dL 0.4  0.4  0.5   Alkaline Phos 38 - 126 U/L 183  204  218   AST 15 - 41 U/L 40  42  34   ALT 0 - 44 U/L 35  38  34     Imaging studies: No new pertinent imaging studies  Assessment/Plan:  56 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   - New 12/11: No new issues, again with transient bleeding over weekend; now stopped.   - Wound Care; Changed 12/07 with bedside RN; Shirlean Schlein pouch with "window" but not on intermittent or continuous suction. Patient seems to like this better and has started to manage effluent independently. She will need intermittent suction with Yankauer to control effluent. Likely be beneficial to suction before ambulation.   - Continue TPN; cyclic; monitor nutritional labs bi-weekly - Appreciate dietary assistance with trace elements; iron supplementation            - Monitor abdominal examination; on-going bowel function            - Pain control prn; antemetic prn            - Progressed with therapies; no longer any  recommendations  - Appreciate neurosurgery assistance with compression fracture; serial XRs   - Discharge Planning: Continue to anticipate lengthy admission and potential eventual transfer; no new progress regarding disposition; question whether or not she is currently insured. TOC note from 11/27 notes Medicaid now "in verification process"   All of the above findings and recommendations were discussed with the patient, and the medical team, and all of patient's questions were answered to her expressed satisfaction.  -- [name], PA-C [facility] 08/18/2022, 7:22 AM M-F: 7am - 4pm

## 2022-08-18 NOTE — Progress Notes (Signed)
PHARMACY - TOTAL PARENTERAL NUTRITION CONSULT NOTE   Indication: Prolonged ileus  Patient Measurements: Height: '4\' 11"'$  (149.9 cm) Weight: 96.1 kg (211 lb 13.8 oz) IBW/kg (Calculated) : 43.2 TPN AdjBW (KG): 55.2 Body mass index is 42.79 kg/m.  Assessment: Debra [name] is a 56 y.o. female s/p laparotomy, excision of greater omental mass, abdominal wall reconstruction with [name], appendectomy, and placement of Prevena vac.  Glucose / Insulin:  --No apparent history of diabetes --rSSI 4x/day + 20u insulin in TPN --24h BG: <180 --SSI: 10u last 24h Electrolytes: Within normal limits Renal: Scr at approximate baseline  Hepatic: LFTs trending up slightly GI Imaging: 9/11 CTAP: no new acute issues GI Surgeries / Procedures: s/p laparotomy, excision of greater omental mass, abdominal wall reconstruction with [name], appendectomy, and placement of Prevena vac  Central access: 02/15/22 TPN start date: 02/15/22  Nutritional Goals: Goal cyclic TPN over 16 hrs: cyclic TPN over 16 hrs: (provides 102 g of protein and 1817 kcals per day) total volume over 16hrs for calculations=1400 ml (1500 ml total with overfill)  RD Assessment:  Estimated Needs Total Energy Estimated Needs: 1800-2100kcal/day Total Protein Estimated Needs: 90-110g/d Total Fluid Estimated Needs: 1.4-1.6L/day  Current Nutrition:  Soft diet + nutritional supplements, not meeting PO needs yet   Plan: Transitioned to *Cyclic* TPN on 6/80.   to run over 16 hours per MD request. Debra [name] at 2000 per discussion with Dietician to allow patient time for walking in afternoon/evenings.  To run over 16 hours: -Start rate at 49 mL/hr for 1 hour. -Increase rate to 98 mL/hr for 13 hours.  -Decrease rate to 49 mL/hr for 1 hour. -Decrease rate to 25 mL/hr for 1 hours, then stop.  Plan:  See cyclic TPN rate above Continue Nutritional Components Amino acids (using 15% Clinisol): 102 g Dextrose 19% =  266 g Lipids (using 20% SMOFlipids): 50.4 g kCal: 1817/24h  Electrolytes in TPN: Na 62mq/L, K 471m/L, Ca 65m6mL, Mg 10 mEq/L, Phos 10 mmol/L, Cl:Ac 1:1 Continue MVI, trace elements, chromium and zinc per dietary recs. Continue CBG/SSI for Cyclic TPN:  -CBG 2 hrs after cyclic TPN start -CBG during middle of cyclic TPN infusion -CBG 1 hr after cylic TPN stopped -CBG while off TPN Continue resistant (=4 CBGs per 24 hr period) SSI + 20u insulin in TPN Check TPN labs on Mon/Thurs at minimum Monitor for further need of diuresis and adjust as needed for fluid status  Debra [name] A   08/18/2022 12:08 PM

## 2022-08-19 LAB — GLUCOSE, CAPILLARY
Glucose-Capillary: 175 mg/dL — ABNORMAL HIGH (ref 70–99)
Glucose-Capillary: 136 mg/dL — ABNORMAL HIGH (ref 70–99)
Glucose-Capillary: 99 mg/dL (ref 70–99)
Glucose-Capillary: 177 mg/dL — ABNORMAL HIGH (ref 70–99)

## 2022-08-19 LAB — VITAMIN B1: Vitamin B1 (Thiamine): 91.2 nmol/L (ref 66.5–200.0)

## 2022-08-19 MED ORDER — SODIUM CHLORIDE 0.9 % IV SOLN
200.0000 mg | INTRAVENOUS | Status: AC
  Administered 2022-08-20 – 2022-08-22 (×3): 200 mg via INTRAVENOUS
  Filled 2022-08-19 (×3): qty 200

## 2022-08-19 MED ORDER — ZINC CHLORIDE 1 MG/ML IV SOLN
INTRAVENOUS | Status: AC
  Filled 2022-08-19: qty 681.33

## 2022-08-19 NOTE — Plan of Care (Signed)
  Problem: Clinical Measurements: Goal: Will remain free from infection Outcome: Progressing   Problem: Nutrition: Goal: Adequate nutrition will be maintained Outcome: Progressing   Problem: Activity: Goal: Risk for activity intolerance will decrease Outcome: Progressing   Problem: Nutrition: Goal: Adequate nutrition will be maintained Outcome: Progressing   Problem: Coping: Goal: Level of anxiety will decrease Outcome: Progressing

## 2022-08-19 NOTE — Progress Notes (Signed)
PHARMACY - TOTAL PARENTERAL NUTRITION CONSULT NOTE   Indication: Prolonged ileus  Patient Measurements: Height: '4\' 11"'$  (149.9 cm) Weight: 96.1 kg (211 lb 13.8 oz) IBW/kg (Calculated) : 43.2 TPN AdjBW (KG): 55.2 Body mass index is 42.79 kg/m.  Assessment: Debra [name] is a 56 y.o. female s/p laparotomy, excision of greater omental mass, abdominal wall reconstruction with [name], appendectomy, and placement of Prevena vac.  Glucose / Insulin:  --No apparent history of diabetes --rSSI 4x/day + 20u insulin in TPN --24h BG: <180 --SSI: 10u last 24h Electrolytes: Within normal limits Renal: Scr at approximate baseline  Hepatic: LFTs trending up slightly GI Imaging: 9/11 CTAP: no new acute issues GI Surgeries / Procedures: s/p laparotomy, excision of greater omental mass, abdominal wall reconstruction with [name], appendectomy, and placement of Prevena vac  Central access: 02/15/22 TPN start date: 02/15/22  Nutritional Goals: Goal cyclic TPN over 16 hrs: cyclic TPN over 16 hrs: (provides 102 g of protein and 1817 kcals per day) total volume over 16hrs for calculations=1400 ml (1500 ml total with overfill)  RD Assessment:  Estimated Needs Total Energy Estimated Needs: 1800-2100kcal/day Total Protein Estimated Needs: 90-110g/d Total Fluid Estimated Needs: 1.4-1.6L/day  Current Nutrition:  Soft diet + nutritional supplements, not meeting PO needs yet   Plan: Transitioned to *Cyclic* TPN on 9/39.   to run over 16 hours per MD request. [name] at 2000 per discussion with Dietician to allow patient time for walking in afternoon/evenings.  To run over 16 hours: -Start rate at 49 mL/hr for 1 hour. -Increase rate to 98 mL/hr for 13 hours.  -Decrease rate to 49 mL/hr for 1 hour. -Decrease rate to 25 mL/hr for 1 hours, then stop.  Plan:  See cyclic TPN rate above Continue Nutritional Components Amino acids (using 15% Clinisol): 102 g Dextrose 19% =  266 g Lipids (using 20% SMOFlipids): 50.4 g kCal: 1817/24h  Electrolytes in TPN: Na 53mq/L, K 462m/L, Ca 72m34mL, Mg 10 mEq/L, Phos 10 mmol/L, Cl:Ac 1:1 Continue MVI, trace elements, chromium and zinc per dietary recs. Continue CBG/SSI for Cyclic TPN:  -CBG 2 hrs after cyclic TPN start -CBG during middle of cyclic TPN infusion -CBG 1 hr after cylic TPN stopped -CBG while off TPN Continue resistant (=4 CBGs per 24 hr period) SSI + 20u insulin in TPN Check TPN labs on Mon/Thurs at minimum Monitor for further need of diuresis and adjust as needed for fluid status    CarGlean SalvoharmD, BCPS Clinical Pharmacist  08/19/2022 8:26 AM

## 2022-08-19 NOTE — Progress Notes (Signed)
Nutrition Follow-up  DOCUMENTATION CODES:   Obesity unspecified  INTERVENTION:   Continue cyclic TPN per pharmacy (16 hrs)- provides 1817kcal/day and 102g/day protein   Ensure Enlive po TID, each supplement provides 350 kcal and 20 grams of protein.  MVI, chromium and trace elements in TPN   Daily weights   Vitamin D2- 50,000 units po weekly   Hold zinc from TPN for now pending lab  Iron sucrose 272m IV x 3 days   Chromium, copper, manganese, vitamin K and zinc labs pending.    NUTRITION DIAGNOSIS:   Increased nutrient needs related to wound healing, catabolic illness as evidenced by estimated needs.  GOAL:   Patient will meet greater than or equal to 90% of their needs -met with TPN   MONITOR:   PO intake, Supplement acceptance, Labs, Weight trends, Diet advancement, I & O's, TPN  ASSESSMENT:   56y/o female with h/o hypothyroidism, COVID 19 (3/21), kidney stones and stage 3 colon cancer (s/p left hemicolectomy 5/21 and chemotherapy) who is admitted for new pelvic mass now s/p laparotomy 6/8 (with excision of pelvic mass from greater omentum, abdominal wall reconstruction with bilateral myocutaneous flaps and mesh, incisional hernia repair, appendectomy repair and VAC placement) complicated by bowel perforation s/p reopening of recent laparotomy 6/10 (with repair of small bowel perforation, excision of mesh, placement of two phasix mesh and VAC placement). Pathology returned as metastatic adenocarcinoma. Pt with L1 compression fracture.   Pt continues to tolerate TPN well at goal rate; TPN is currently being cycled for 16 hours. Triglycerides wnl and are being checked monthly. Hyperglycemia improved with insulin in TPN. Volume status improved with diuresis; pt remains up ~14lbs from admission. Vitamin labs improved; several are still pending. Will repeat iron sucrose. Pt continues to have poor oral intake. [name] pouch remains in place with 9272moutput. No discharge plan in  place.   Medications reviewed and include: D2, insulin, synthroid, protonix, carafate  -Selenium 93(L), Iodine 176.8(H)- 12/7 -Manganese 25.6(H), copper 121, Zinc 69 - 11/2 -Chromium 1.4 wnl- 10/2 -Iron 32.0 wnl, TIBC 330, ferritin 54 wnl, folate 14.4 wnl, B12 769- 12/7 -Vitamin B1- 91.2, vitamin D- 47.88, vitamin A- 21.5, B6- 22.8 wnl, vitamin C- 0.5, vitamin E- 12.7 wnl- 12/7  -Vitamin K- 0.64 wnl- 10/16  Labs reviewed: Na 135 wnl, K 4.1 wnl, creat 0.34(L), P 3.3 wnl, Mg 2.0 wnl- 12/11 Triglycerides- 84-11/15 Cbgs- 136, 175 x 24 hrs  Diet Order:    Diet Order             DIET SOFT Room service appropriate? Yes; Fluid consistency: Thin  Diet effective now                  EDUCATION NEEDS:   Not appropriate for education at this time  Skin:  Skin Assessment: Reviewed RN Assessment ( Midline wound: 11cm x 12cm )  Last BM:  12/12  Height:   Ht Readings from Last 1 Encounters:  03/01/22 _0  (1.499 m)    Weight:   Wt Readings from Last 1 Encounters:  08/19/22 96.1 kg    Ideal Body Weight:  44.3 kg  BMI:  Body mass index is 42.79 kg/m.  Estimated Nutritional Needs:   Kcal:  1800-2100kcal/day  Protein:  90-110g/d  Fluid:  1.4-1.6L/day  CaKoleen DistanceS, RD, LDN Please refer to [facility] Centeror RD and/or RD on-call/weekend/after hours pager

## 2022-08-19 NOTE — Progress Notes (Signed)
[facility] SURGICAL PROGRESS NOTE (cpt [phone])  Hospital Day(s): 187.   Post op day(s): 185 Days Post-Op.   Interval History:  Patient seen and examined CT Abdomen/Pelvis reviewed; no acute findings Patient reports abdominal pain is not as bad today; still some in suprapubic region [name] last changed 12/07 by myself and bedside RN [name] output 550 ccs + unmeasured; she is starting to manage effluent independently No new labs  She is on soft diet; on cyclic TPN Progressed with PT; no longer with any recommendations   Had repeat CT A/P last night without obvious source of abdominal pain  Review of Systems:  Constitutional: denies fever, chills  HEENT: denies cough or congestion  Respiratory: denies any shortness of breath  Cardiovascular: denies chest pain or palpitations  Gastrointestinal: + Abdominal pain, denies N/V Genitourinary: denies burning with urination or urinary frequency Integumentary: + midline wound (stable)   Vital signs in last 24 hours: [min-max] current  Temp:  [98.2 F (36.8 C)-98.5 F (36.9 C)] 98.5 F (36.9 C) (12/12 0436) Pulse Rate:  [84-92] 85 (12/12 0436) Resp:  [16-20] 20 (12/12 0436) BP: (98-110)/(55-61) 110/61 (12/12 0436) SpO2:  [96 %-99 %] 97 % (12/12 0436) Weight:  [96.1 kg] 96.1 kg (12/12 0436)     Height: '4\' 11"'$  (149.9 cm) Weight: 96.1 kg BMI (Calculated): 43.04   Intake/Output last 2 shifts:  12/11 0701 - 12/12 0700 In: 240 [P.O.:240] Out: 650 [Urine:100; Drains:550]   Physical Exam:  Constitutional: alert, cooperative and no distress  Respiratory: breathing non-labored at rest  Cardiovascular: regular rate and sinus rhythm  Gastrointestinal: Soft, slight amount of tenderness in the suprapubic region, no RLQ tenderness, non-distended, no rebound/guarding. Integumentary: Midline wound open, peritoneum closed; granulating; there are three stomatized areas visible in the LUQ and LLQ portions of the wound, output remains  feculent. There is hypergranulation tissue to the inferior wound edge.     Labs:     Latest Ref Rng & Units 08/11/2022    4:23 AM 08/01/2022    4:29 AM 07/13/2022    7:05 AM  CBC  WBC 4.0 - 10.5 K/uL 4.8  5.7  4.5   Hemoglobin 12.0 - 15.0 g/dL 7.8  8.4  9.9   Hematocrit 36.0 - 46.0 % 24.1  25.7  31.1   Platelets 150 - 400 K/uL 138  137  115       Latest Ref Rng & Units 08/18/2022    4:27 AM 08/14/2022    5:52 AM 08/11/2022    4:23 AM  CMP  Glucose 70 - 99 mg/dL 166  158  158   BUN 6 - 20 mg/dL '19  20  22   '$ Creatinine 0.44 - 1.00 mg/dL 0.34  0.42  0.42   Sodium 135 - 145 mmol/L 135  134  133   Potassium 3.5 - 5.1 mmol/L 4.1  4.5  4.5   Chloride 98 - 111 mmol/L 106  108  105   CO2 22 - 32 mmol/L '25  24  25   '$ Calcium 8.9 - 10.3 mg/dL 8.3  8.3  8.3   Total Protein 6.5 - 8.1 g/dL 6.7  7.1  6.8   Total Bilirubin 0.3 - 1.2 mg/dL 0.4  0.4  0.5   Alkaline Phos 38 - 126 U/L 183  204  218   AST 15 - 41 U/L 40  42  34   ALT 0 - 44 U/L 35  38  34     Imaging  studies:  CT Abdomen/Pelvis (08/18/2022) personally reviewed again showing enterocutaneous fistulas, no obvious source of RLQ pain, no other acute intra-abdominal findings, and radiologist reptr reviewed below: IMPRESSION: Contrast again seen in the open midline incision, presumably from enterocutaneous fistulous from underlying small bowel loops. Overall appearance is stable since prior study.   Continued mild dilatation of a single small bowel loop in the left abdomen, stable.   Bibasilar atelectasis or scarring.   No significant change since prior study.   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   - New 12/12: Abdominal pain improved  this morning; CT reassuring   - Wound Care; Changed 12/07 with bedside RN; Shirlean Schlein pouch with "window" but not on intermittent or continuous suction. Patient seems to like this better and has started to manage effluent independently. She will need intermittent suction with Yankauer to control effluent. Likely be beneficial to suction before ambulation.   - Continue TPN; cyclic; monitor nutritional labs bi-weekly - Appreciate dietary assistance with trace elements; iron supplementation            - Monitor abdominal examination; on-going bowel function            - Pain control prn; antemetic prn            - Progressed with therapies; no longer any recommendations  - Appreciate neurosurgery assistance with compression fracture; serial XRs   - Discharge Planning: Continue to anticipate lengthy admission and potential eventual transfer; no new progress regarding disposition; question whether or not she is currently insured. TOC note from 11/27 notes Medicaid now "in verification process"   All of the above findings and recommendations were discussed with the patient, and the medical team, and all of patient's questions were answered to her expressed satisfaction.  -- [name], PA-C [facility] 08/19/2022, 7:15 AM M-F: 7am - 4pm

## 2022-08-20 LAB — GLUCOSE, CAPILLARY
Glucose-Capillary: 193 mg/dL — ABNORMAL HIGH (ref 70–99)
Glucose-Capillary: 188 mg/dL — ABNORMAL HIGH (ref 70–99)
Glucose-Capillary: 115 mg/dL — ABNORMAL HIGH (ref 70–99)
Glucose-Capillary: 158 mg/dL — ABNORMAL HIGH (ref 70–99)

## 2022-08-20 LAB — VITAMIN K1, SERUM: VITAMIN K1: 0.82 ng/mL (ref 0.10–2.20)

## 2022-08-20 MED ORDER — ZINC CHLORIDE 1 MG/ML IV SOLN
INTRAVENOUS | Status: AC
  Filled 2022-08-20: qty 681.33

## 2022-08-20 NOTE — Plan of Care (Signed)
  Problem: Clinical Measurements: Goal: Will remain free from infection Outcome: Progressing   Problem: Nutrition: Goal: Adequate nutrition will be maintained Outcome: Progressing   Problem: Activity: Goal: Risk for activity intolerance will decrease Outcome: Progressing   Problem: Nutrition: Goal: Adequate nutrition will be maintained Outcome: Progressing   Problem: Coping: Goal: Level of anxiety will decrease Outcome: Progressing

## 2022-08-20 NOTE — Progress Notes (Signed)
[facility] SURGICAL PROGRESS NOTE (cpt [phone])  Hospital Day(s): 188.   Post op day(s): 186 Days Post-Op.   Interval History:  Patient seen and examined Blood clots in [name] overnight Leaking from inferior edge  [name] last changed 12/07 by myself and bedside RN [name] output unmeasured; she is starting to manage effluent independently She is on soft diet; on cyclic TPN Progressed with PT; no longer with any recommendations   Had repeat CT A/P last night without obvious source of abdominal pain  Review of Systems:  Constitutional: denies fever, chills  HEENT: denies cough or congestion  Respiratory: denies any shortness of breath  Cardiovascular: denies chest pain or palpitations  Gastrointestinal: + Abdominal pain, denies N/V Genitourinary: denies burning with urination or urinary frequency Integumentary: + midline wound (stable)   Vital signs in last 24 hours: [min-max] current  Temp:  [97.8 F (36.6 C)-98.3 F (36.8 C)] 98.3 F (36.8 C) (12/13 0459) Pulse Rate:  [75-79] 79 (12/13 0459) Resp:  [16-18] 18 (12/13 0459) BP: (93-109)/(54-67) 109/62 (12/13 0459) SpO2:  [98 %-100 %] 98 % (12/13 0459)     Height: '4\' 11"'$  (149.9 cm) Weight: 96.1 kg BMI (Calculated): 43.04   Intake/Output last 2 shifts:  12/12 0701 - 12/13 0700 In: 2193.5 [I.V.:2193.5] Out: 240 [Urine:120; Drains:120]   Physical Exam:  Constitutional: alert, cooperative and no distress  Respiratory: breathing non-labored at rest  Cardiovascular: regular rate and sinus rhythm  Gastrointestinal: Soft, slight amount of tenderness in the suprapubic region, no RLQ tenderness, non-distended, no rebound/guarding. Integumentary: Midline wound open, peritoneum closed; granulating; there are three stomatized areas visible in the LUQ and LLQ portions of the wound, output remains feculent. There is hypergranulation tissue to the inferior wound edge.     Labs:     Latest Ref Rng & Units 08/11/2022     4:23 AM 08/01/2022    4:29 AM 07/13/2022    7:05 AM  CBC  WBC 4.0 - 10.5 K/uL 4.8  5.7  4.5   Hemoglobin 12.0 - 15.0 g/dL 7.8  8.4  9.9   Hematocrit 36.0 - 46.0 % 24.1  25.7  31.1   Platelets 150 - 400 K/uL 138  137  115       Latest Ref Rng & Units 08/18/2022    4:27 AM 08/14/2022    5:52 AM 08/11/2022    4:23 AM  CMP  Glucose 70 - 99 mg/dL 166  158  158   BUN 6 - 20 mg/dL '19  20  22   '$ Creatinine 0.44 - 1.00 mg/dL 0.34  0.42  0.42   Sodium 135 - 145 mmol/L 135  134  133   Potassium 3.5 - 5.1 mmol/L 4.1  4.5  4.5   Chloride 98 - 111 mmol/L 106  108  105   CO2 22 - 32 mmol/L '25  24  25   '$ Calcium 8.9 - 10.3 mg/dL 8.3  8.3  8.3   Total Protein 6.5 - 8.1 g/dL 6.7  7.1  6.8   Total Bilirubin 0.3 - 1.2 mg/dL 0.4  0.4  0.5   Alkaline Phos 38 - 126 U/L 183  204  218   AST 15 - 41 U/L 40  42  34   ALT 0 - 44 U/L 35  38  34     Imaging studies: No new pertinent imaging studies this AM   Assessment/Plan:  56 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   - New 12/13: Leaking; will ask WOC RN for assistance. I am available to help later this morning if needed.   - Wound Care; Changed 12/07 with bedside RN; Shirlean Schlein pouch with "window" but not on intermittent or continuous suction. Patient seems to like this better and has started to manage effluent independently. She will need intermittent suction with Yankauer to control effluent. Likely be beneficial to suction before ambulation.   - Continue TPN; cyclic; monitor nutritional labs bi-weekly - Appreciate dietary assistance with trace elements; iron supplementation            - Monitor abdominal examination; on-going bowel function            - Pain control prn; antemetic prn             - Progressed with therapies; no longer any recommendations  - Appreciate neurosurgery assistance with compression fracture; serial XRs   - Discharge Planning: Continue to anticipate lengthy admission and potential eventual transfer; no new progress regarding disposition; question whether or not she is currently insured. TOC note from 11/27 notes Medicaid now "in verification process"   All of the above findings and recommendations were discussed with the patient, and the medical team, and all of patient's questions were answered to her expressed satisfaction.  -- [name], PA-C [facility] 08/20/2022, 8:01 AM M-F: 7am - 4pm

## 2022-08-20 NOTE — Progress Notes (Signed)
PHARMACY - TOTAL PARENTERAL NUTRITION CONSULT NOTE   Indication: Prolonged ileus  Patient Measurements: Height: '4\' 11"'$  (149.9 cm) Weight: 96.1 kg (211 lb 13.8 oz) IBW/kg (Calculated) : 43.2 TPN AdjBW (KG): 55.2 Body mass index is 42.79 kg/m.  Assessment: Debra [name] is a 56 y.o. female s/p laparotomy, excision of greater omental mass, abdominal wall reconstruction with Debra [name] release, appendectomy, and placement of Prevena vac.  Glucose / Insulin:  --No apparent history of diabetes --rSSI 4x/day + 20u insulin in TPN --24h BG: <180 --SSI: 10u last 24h Electrolytes: Within normal limits Renal: Scr at approximate baseline  Hepatic: LFTs trending up slightly GI Imaging: 9/11 CTAP: no new acute issues GI Surgeries / Procedures: s/p laparotomy, excision of greater omental mass, abdominal wall reconstruction with Debra [name] release, appendectomy, and placement of Prevena vac  Central access: 02/15/22 TPN start date: 02/15/22  Nutritional Goals: Goal cyclic TPN over 16 hrs: cyclic TPN over 16 hrs: (provides 102 g of protein and 1817 kcals per day) total volume over 16hrs for calculations=1400 ml (1500 ml total with overfill)  RD Assessment:  Estimated Needs Total Energy Estimated Needs: 1800-2100kcal/day Total Protein Estimated Needs: 90-110g/d Total Fluid Estimated Needs: 1.4-1.6L/day  Current Nutrition:  Soft diet + nutritional supplements, not meeting PO needs yet   Plan: Transitioned to *Cyclic* TPN on 1/51.   to run over 16 hours per MD request. [name] at 2000 per discussion with Dietician to allow patient time for walking in afternoon/evenings.  To run over 16 hours: -Start rate at 49 mL/hr for 1 hour. -Increase rate to 98 mL/hr for 13 hours.  -Decrease rate to 49 mL/hr for 1 hour. -Decrease rate to 25 mL/hr for 1 hours, then stop.  Plan:  See cyclic TPN rate above Continue Nutritional Components Amino acids (using 15% Clinisol): 102 g Dextrose 19% =  266 g Lipids (using 20% SMOFlipids): 50.4 g kCal: 1817/24h  Electrolytes in TPN: Na 41mq/L, K 423m/L, Ca 39m37mL, Mg 10 mEq/L, Phos 10 mmol/L, Cl:Ac 1:1 Continue MVI, trace elements, chromium and zinc per dietary recs. Continue CBG/SSI for Cyclic TPN:  -CBG 2 hrs after cyclic TPN start -CBG during middle of cyclic TPN infusion -CBG 1 hr after cylic TPN stopped -CBG while off TPN Continue resistant (=4 CBGs per 24 hr period) SSI + 20u insulin in TPN Check TPN labs on Mon/Thurs at minimum Monitor for further need of diuresis and adjust as needed for fluid status    CarGlean SalvoharmD, BCPS Clinical Pharmacist  08/20/2022 10:47 AM

## 2022-08-21 LAB — COMPREHENSIVE METABOLIC PANEL
ALT: 32 U/L (ref 0–44)
AST: 35 U/L (ref 15–41)
Albumin: 2.3 g/dL — ABNORMAL LOW (ref 3.5–5.0)
Alkaline Phosphatase: 149 U/L — ABNORMAL HIGH (ref 38–126)
Anion gap: 4 — ABNORMAL LOW (ref 5–15)
BUN: 21 mg/dL — ABNORMAL HIGH (ref 6–20)
CO2: 21 mmol/L — ABNORMAL LOW (ref 22–32)
Calcium: 8 mg/dL — ABNORMAL LOW (ref 8.9–10.3)
Chloride: 110 mmol/L (ref 98–111)
Creatinine, Ser: 0.3 mg/dL — ABNORMAL LOW (ref 0.44–1.00)
GFR, Estimated: >60 mL/min (ref >60)
Glucose, Bld: 168 mg/dL — ABNORMAL HIGH (ref 70–99)
Potassium: 4.4 mmol/L (ref 3.5–5.1)
Sodium: 135 mmol/L (ref 135–145)
Total Bilirubin: 0.6 mg/dL (ref 0.3–1.2)
Total Protein: 6.2 g/dL — ABNORMAL LOW (ref 6.5–8.1)

## 2022-08-21 LAB — MISC LABCORP TEST (SEND OUT): Labcorp test code: 71522

## 2022-08-21 LAB — GLUCOSE, CAPILLARY
Glucose-Capillary: 157 mg/dL — ABNORMAL HIGH (ref 70–99)
Glucose-Capillary: 174 mg/dL — ABNORMAL HIGH (ref 70–99)
Glucose-Capillary: 111 mg/dL — ABNORMAL HIGH (ref 70–99)
Glucose-Capillary: 111 mg/dL — ABNORMAL HIGH (ref 70–99)
Glucose-Capillary: 105 mg/dL — ABNORMAL HIGH (ref 70–99)
Glucose-Capillary: 147 mg/dL — ABNORMAL HIGH (ref 70–99)

## 2022-08-21 LAB — MAGNESIUM: Magnesium: 2 mg/dL (ref 1.7–2.4)

## 2022-08-21 LAB — PHOSPHORUS: Phosphorus: 2.9 mg/dL (ref 2.5–4.6)

## 2022-08-21 MED ORDER — ZINC CHLORIDE 1 MG/ML IV SOLN
INTRAVENOUS | Status: AC
  Filled 2022-08-21: qty 681.33

## 2022-08-21 NOTE — TOC Progression Note (Signed)
Transition of Care [facility]) - Progression Note    Patient Details  Name: Debra [name] MRN: [id number] Date of Birth: [birth year]  Transition of Care [facility]) CM/SW Contact  Beverly Sessions, RN Phone Number: 08/21/2022, 2:01 PM  Clinical Narrative:     No update for discharge planning  Expected Discharge Plan: [place] Barriers to Discharge: Continued Medical Work up  Expected Discharge Plan and Services Expected Discharge Plan: [place]                                               Social Determinants of Health (SDOH) Interventions    Readmission Risk Interventions     No data to display

## 2022-08-21 NOTE — Progress Notes (Signed)
[facility] SURGICAL PROGRESS NOTE (cpt [phone])  Hospital Day(s): 189.   Post op day(s): 187 Days Post-Op.   Interval History:  Patient seen and examined [name] last changed 12/13 by bedside RN [name] output 240 ccs + unmeasured; she is starting to manage effluent independently She is on soft diet; on cyclic TPN Progressed with PT; no longer with any recommendations   Review of Systems:  Constitutional: denies fever, chills  HEENT: denies cough or congestion  Respiratory: denies any shortness of breath  Cardiovascular: denies chest pain or palpitations  Gastrointestinal: + Abdominal pain, denies N/V Genitourinary: denies burning with urination or urinary frequency Integumentary: + midline wound (stable)   Vital signs in last 24 hours: [min-max] current  Temp:  [98.3 F (36.8 C)-98.6 F (37 C)] 98.3 F (36.8 C) (12/14 0327) Pulse Rate:  [84-101] 86 (12/14 0327) Resp:  [16-20] 16 (12/14 0327) BP: (99-113)/(52-84) 99/60 (12/14 0327) SpO2:  [94 %-98 %] 98 % (12/14 0327) Weight:  [95 kg] 95 kg (12/14 0327)     Height: '4\' 11"'$  (149.9 cm) Weight: 95 kg BMI (Calculated): 43.04   Intake/Output last 2 shifts:  12/13 0701 - 12/14 0700 In: 9390 [P.O.:120; I.V.:1199.9; IV Piggyback:104.1] Out: 690 [Urine:450; Drains:240]   Physical Exam:  Constitutional: alert, cooperative and no distress  Respiratory: breathing non-labored at rest  Cardiovascular: regular rate and sinus rhythm  Gastrointestinal: Soft, slight amount of tenderness in the suprapubic region, no RLQ tenderness, non-distended, no rebound/guarding. Integumentary: Midline wound open, peritoneum closed; granulating; there are three stomatized areas visible in the LUQ and LLQ portions of the wound, output remains feculent. There is hypergranulation tissue to the inferior wound edge.     Labs:     Latest Ref Rng & Units 08/11/2022    4:23 AM 08/01/2022    4:29 AM 07/13/2022    7:05 AM  CBC  WBC 4.0 - 10.5  K/uL 4.8  5.7  4.5   Hemoglobin 12.0 - 15.0 g/dL 7.8  8.4  9.9   Hematocrit 36.0 - 46.0 % 24.1  25.7  31.1   Platelets 150 - 400 K/uL 138  137  115       Latest Ref Rng & Units 08/21/2022    5:53 AM 08/18/2022    4:27 AM 08/14/2022    5:52 AM  CMP  Glucose 70 - 99 mg/dL 168  166  158   BUN 6 - 20 mg/dL '21  19  20   '$ Creatinine 0.44 - 1.00 mg/dL 0.30  0.34  0.42   Sodium 135 - 145 mmol/L 135  135  134   Potassium 3.5 - 5.1 mmol/L 4.4  4.1  4.5   Chloride 98 - 111 mmol/L 110  106  108   CO2 22 - 32 mmol/L '21  25  24   '$ Calcium 8.9 - 10.3 mg/dL 8.0  8.3  8.3   Total Protein 6.5 - 8.1 g/dL 6.2  6.7  7.1   Total Bilirubin 0.3 - 1.2 mg/dL 0.6  0.4  0.4   Alkaline Phos 38 - 126 U/L 149  183  204   AST 15 - 41 U/L 35  40  42   ALT 0 - 44 U/L 32  35  38     Imaging studies: No new pertinent imaging studies this AM   Assessment/Plan:  56 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   - New 12/14: No new issues  - Wound Care; Changed 12/13 by bedside RN; [name] pouch with "window" but not on intermittent or continuous suction. Patient seems to like this better and has started to manage effluent independently. She will need intermittent suction with Yankauer to control effluent. Likely be beneficial to suction before ambulation.   - Continue TPN; cyclic; monitor nutritional labs bi-weekly - Appreciate dietary assistance with trace elements; iron supplementation            - Monitor abdominal examination; on-going bowel function            - Pain control prn; antemetic prn            - Progressed with therapies; no longer any recommendations  - Appreciate neurosurgery assistance with compression fracture; serial XRs   -  Discharge Planning: Continue to anticipate lengthy admission and potential eventual transfer; no new progress regarding disposition; question whether or not she is currently insured. TOC note from 11/27 notes Medicaid now "in verification process"   All of the above findings and recommendations were discussed with the patient, and the medical team, and all of patient's questions were answered to her expressed satisfaction.  -- Debra Simon, PA-C [facility] 08/21/2022, 7:13 AM M-F: 7am - 4pm

## 2022-08-22 LAB — COPPER, SERUM: Copper: 118 ug/dL (ref 80–158)

## 2022-08-22 LAB — GLUCOSE, CAPILLARY
Glucose-Capillary: 153 mg/dL — ABNORMAL HIGH (ref 70–99)
Glucose-Capillary: 173 mg/dL — ABNORMAL HIGH (ref 70–99)
Glucose-Capillary: 156 mg/dL — ABNORMAL HIGH (ref 70–99)
Glucose-Capillary: 83 mg/dL (ref 70–99)
Glucose-Capillary: 158 mg/dL — ABNORMAL HIGH (ref 70–99)

## 2022-08-22 LAB — TRIGLYCERIDES: Triglycerides: 83 mg/dL (ref <150)

## 2022-08-22 LAB — ZINC: Zinc: 61 ug/dL (ref 44–115)

## 2022-08-22 LAB — T4, FREE: Free T4: 0.92 ng/dL (ref 0.61–1.12)

## 2022-08-22 MED ORDER — ZINC CHLORIDE 1 MG/ML IV SOLN
INTRAVENOUS | Status: AC
  Filled 2022-08-22: qty 681.33

## 2022-08-22 NOTE — Progress Notes (Signed)
PHARMACY - TOTAL PARENTERAL NUTRITION CONSULT NOTE   Indication: Prolonged ileus  Patient Measurements: Height: '4\' 11"'$  (149.9 cm) Weight: 97.1 kg (214 lb 1.1 oz) IBW/kg (Calculated) : 43.2 TPN AdjBW (KG): 55.2 Body mass index is 43.24 kg/m.  Assessment: Debra [name] is a 56 y.o. female s/p laparotomy, excision of greater omental mass, abdominal wall reconstruction with [name], appendectomy, and placement of Prevena vac.  Glucose / Insulin:  --No apparent history of diabetes --CBG's 110-170, all < 180 --rSSI 4x/day + 20u insulin in TPN --SSI: 7u last 24h Electrolytes: Within normal limits Renal: Scr at approximate baseline  Hepatic: LFTs trending up slightly GI Imaging: 9/11 CTAP: no new acute issues GI Surgeries / Procedures: s/p laparotomy, excision of greater omental mass, abdominal wall reconstruction with [name], appendectomy, and placement of Prevena vac  Central access: 02/15/22 TPN start date: 02/15/22  Nutritional Goals: Goal cyclic TPN over 16 hrs: cyclic TPN over 16 hrs: (provides 102 g of protein and 1817 kcals per day) total volume over 16hrs for calculations=1400 ml (1500 ml total with overfill)  RD Assessment:  Estimated Needs Total Energy Estimated Needs: 1800-2100kcal/day Total Protein Estimated Needs: 90-110g/d Total Fluid Estimated Needs: 1.4-1.6L/day  Current Nutrition:  Soft diet + nutritional supplements, not meeting PO needs yet   Plan: Transitioned to *Cyclic* TPN on 9/15.   to run over 16 hours per MD request. [name] at 2000 per discussion with Dietician to allow patient time for walking in afternoon/evenings.  To run over 16 hours: -Start rate at 49 mL/hr for 1 hour. -Increase rate to 98 mL/hr for 13 hours.  -Decrease rate to 49 mL/hr for 1 hour. -Decrease rate to 25 mL/hr for 1 hours, then stop.  Plan:  See cyclic TPN rate above Continue Nutritional Components Amino acids (using 15% Clinisol): 102  g Dextrose 19% = 266 g Lipids (using 20% SMOFlipids): 50.4 g kCal: 1817/24h  Electrolytes in TPN: Na 36mq/L, K 464m/L, Ca 85m37mL, Mg 10 mEq/L, Phos 10 mmol/L, Cl:Ac 1:1 Continue MVI, trace elements, chromium and zinc per dietary recs. Continue CBG/SSI for Cyclic TPN:  -CBG 2 hrs after cyclic TPN start -CBG during middle of cyclic TPN infusion -CBG 1 hr after cylic TPN stopped -CBG while off TPN Continue resistant (=4 CBGs per 24 hr period) SSI + 20u insulin in TPN Check TPN labs on Mon/Thurs at minimum Monitor for further need of diuresis and adjust as needed for fluid status Add zinc 5 mg to TPN 12/16.    CarGlean SalvoharmD, BCPS Clinical Pharmacist  08/22/2022 2:38 PM

## 2022-08-22 NOTE — Plan of Care (Signed)
  Problem: Clinical Measurements: Goal: Will remain free from infection Outcome: Progressing   Problem: Activity: Goal: Risk for activity intolerance will decrease Outcome: Progressing   Problem: Nutrition: Goal: Adequate nutrition will be maintained Outcome: Progressing   Problem: Coping: Goal: Level of anxiety will decrease Outcome: Progressing   Problem: Pain Managment: Goal: General experience of comfort will improve Outcome: Progressing   

## 2022-08-22 NOTE — Progress Notes (Signed)
PHARMACY - TOTAL PARENTERAL NUTRITION CONSULT NOTE   Indication: Prolonged ileus  Patient Measurements: Height: '4\' 11"'$  (149.9 cm) Weight: 97.1 kg (214 lb 1.1 oz) IBW/kg (Calculated) : 43.2 TPN AdjBW (KG): 55.2 Body mass index is 43.24 kg/m.  Assessment: Abby Kalayah Leske is a 56 y.o. female s/p laparotomy, excision of greater omental mass, abdominal wall reconstruction with [name], appendectomy, and placement of Prevena vac.  Glucose / Insulin:  --No apparent history of diabetes --CBG's 110-170, all < 180 --rSSI 4x/day + 20u insulin in TPN --SSI: 7u last 24h Electrolytes: Within normal limits Renal: Scr at approximate baseline  Hepatic: LFTs trending up slightly GI Imaging: 9/11 CTAP: no new acute issues GI Surgeries / Procedures: s/p laparotomy, excision of greater omental mass, abdominal wall reconstruction with [name], appendectomy, and placement of Prevena vac  Central access: 02/15/22 TPN start date: 02/15/22  Nutritional Goals: Goal cyclic TPN over 16 hrs: cyclic TPN over 16 hrs: (provides 102 g of protein and 1817 kcals per day) total volume over 16hrs for calculations=1400 ml (1500 ml total with overfill)  RD Assessment:  Estimated Needs Total Energy Estimated Needs: 1800-2100kcal/day Total Protein Estimated Needs: 90-110g/d Total Fluid Estimated Needs: 1.4-1.6L/day  Current Nutrition:  Soft diet + nutritional supplements, not meeting PO needs yet   Plan: Transitioned to *Cyclic* TPN on 2/87.   to run over 16 hours per MD request. [name] at 2000 per discussion with Dietician to allow patient time for walking in afternoon/evenings.  To run over 16 hours: -Start rate at 49 mL/hr for 1 hour. -Increase rate to 98 mL/hr for 13 hours.  -Decrease rate to 49 mL/hr for 1 hour. -Decrease rate to 25 mL/hr for 1 hours, then stop.  Plan:  See cyclic TPN rate above Continue Nutritional Components Amino acids (using 15% Clinisol): 102  g Dextrose 19% = 266 g Lipids (using 20% SMOFlipids): 50.4 g kCal: 1817/24h  Electrolytes in TPN: Na 21mq/L, K 41m/L, Ca 53m73mL, Mg 10 mEq/L, Phos 10 mmol/L, Cl:Ac 1:1 Continue MVI, trace elements, chromium and zinc per dietary recs. Continue CBG/SSI for Cyclic TPN:  -CBG 2 hrs after cyclic TPN start -CBG during middle of cyclic TPN infusion -CBG 1 hr after cylic TPN stopped -CBG while off TPN Continue resistant (=4 CBGs per 24 hr period) SSI + 20u insulin in TPN Check TPN labs on Mon/Thurs at minimum Monitor for further need of diuresis and adjust as needed for fluid status    CarGlean SalvoharmD, BCPS Clinical Pharmacist  08/22/2022 9:12 AM

## 2022-08-22 NOTE — Progress Notes (Signed)
[facility] SURGICAL PROGRESS NOTE (cpt [phone])  Hospital Day(s): 190.   Post op day(s): 188 Days Post-Op.   Interval History:  Patient seen and examined [name] last changed 12/13 by bedside RN No new issues [name] output 200 ccs + unmeasured; she is starting to manage effluent independently She is on soft diet; on cyclic TPN Progressed with PT; no longer with any recommendations   Review of Systems:  Constitutional: denies fever, chills  HEENT: denies cough or congestion  Respiratory: denies any shortness of breath  Cardiovascular: denies chest pain or palpitations  Gastrointestinal: + Abdominal pain, denies N/V Genitourinary: denies burning with urination or urinary frequency Integumentary: + midline wound (stable)   Vital signs in last 24 hours: [min-max] current  Temp:  [98 F (36.7 C)-98.6 F (37 C)] 98 F (36.7 C) (12/15 0358) Pulse Rate:  [79-87] 79 (12/15 0358) Resp:  [16-19] 16 (12/15 0358) BP: (97-112)/(55-61) 97/58 (12/15 0358) SpO2:  [97 %-100 %] 97 % (12/15 0358) Weight:  [97.1 kg] 97.1 kg (12/15 0358)     Height: '4\' 11"'$  (149.9 cm) Weight: 97.1 kg BMI (Calculated): 43.04   Intake/Output last 2 shifts:  12/14 0701 - 12/15 0700 In: 0  Out: 900 [Urine:700; Stool:200]   Physical Exam:  Constitutional: alert, cooperative and no distress  Respiratory: breathing non-labored at rest  Cardiovascular: regular rate and sinus rhythm  Gastrointestinal: Soft, slight amount of tenderness in the suprapubic region, no RLQ tenderness, non-distended, no rebound/guarding. Integumentary: Midline wound open, peritoneum closed; granulating; there are three stomatized areas visible in the LUQ and LLQ portions of the wound, output remains feculent. There is hypergranulation tissue to the inferior wound edge.     Labs:     Latest Ref Rng & Units 08/11/2022    4:23 AM 08/01/2022    4:29 AM 07/13/2022    7:05 AM  CBC  WBC 4.0 - 10.5 K/uL 4.8  5.7  4.5    Hemoglobin 12.0 - 15.0 g/dL 7.8  8.4  9.9   Hematocrit 36.0 - 46.0 % 24.1  25.7  31.1   Platelets 150 - 400 K/uL 138  137  115       Latest Ref Rng & Units 08/21/2022    5:53 AM 08/18/2022    4:27 AM 08/14/2022    5:52 AM  CMP  Glucose 70 - 99 mg/dL 168  166  158   BUN 6 - 20 mg/dL '21  19  20   '$ Creatinine 0.44 - 1.00 mg/dL 0.30  0.34  0.42   Sodium 135 - 145 mmol/L 135  135  134   Potassium 3.5 - 5.1 mmol/L 4.4  4.1  4.5   Chloride 98 - 111 mmol/L 110  106  108   CO2 22 - 32 mmol/L '21  25  24   '$ Calcium 8.9 - 10.3 mg/dL 8.0  8.3  8.3   Total Protein 6.5 - 8.1 g/dL 6.2  6.7  7.1   Total Bilirubin 0.3 - 1.2 mg/dL 0.6  0.4  0.4   Alkaline Phos 38 - 126 U/L 149  183  204   AST 15 - 41 U/L 35  40  42   ALT 0 - 44 U/L 32  35  38     Imaging studies: No new pertinent imaging studies this AM   Assessment/Plan:  56 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   - New 12/15: No new issues; thyroid labs prompted by elevated iodine level  - Wound Care; Changed 12/13 by bedside RN; [name] pouch with "window" but not on intermittent or continuous suction. Patient seems to like this better and has started to manage effluent independently. She will need intermittent suction with Yankauer to control effluent. Likely be beneficial to suction before ambulation.   - Continue TPN; cyclic; monitor nutritional labs bi-weekly - Appreciate dietary assistance with trace elements; iron supplementation            - Monitor abdominal examination; on-going bowel function            - Pain control prn; antemetic prn            - Progressed with therapies; no longer any recommendations  - Appreciate neurosurgery assistance with compression  fracture; serial XRs   - Discharge Planning: Continue to anticipate lengthy admission and potential eventual transfer; no new progress regarding disposition; question whether or not she is currently insured. TOC note from 11/27 notes Medicaid now "in verification process"   All of the above findings and recommendations were discussed with the patient, and the medical team, and all of patient's questions were answered to her expressed satisfaction.  -- [name], PA-C [facility] 08/22/2022, 7:38 AM M-F: 7am - 4pm

## 2022-08-23 LAB — THYROID PANEL WITH TSH
Free Thyroxine Index: 2.1 (ref 1.2–4.9)
T3 Uptake Ratio: 31 % (ref 24–39)
T4, Total: 6.8 ug/dL (ref 4.5–12.0)
TSH: 2.88 u[IU]/mL (ref 0.450–4.500)

## 2022-08-23 LAB — GLUCOSE, CAPILLARY
Glucose-Capillary: 178 mg/dL — ABNORMAL HIGH (ref 70–99)
Glucose-Capillary: 99 mg/dL (ref 70–99)
Glucose-Capillary: 104 mg/dL — ABNORMAL HIGH (ref 70–99)
Glucose-Capillary: 93 mg/dL (ref 70–99)

## 2022-08-23 LAB — T3: T3, Total: 71 ng/dL (ref 71–180)

## 2022-08-23 MED ORDER — ZINC CHLORIDE 1 MG/ML IV SOLN
INTRAVENOUS | Status: AC
  Filled 2022-08-23: qty 681.33

## 2022-08-23 NOTE — Progress Notes (Signed)
PHARMACY - TOTAL PARENTERAL NUTRITION CONSULT NOTE   Indication: Prolonged ileus  Patient Measurements: Height: '4\' 11"'$  (149.9 cm) Weight: 97.6 kg (215 lb 2.7 oz) IBW/kg (Calculated) : 43.2 TPN AdjBW (KG): 55.2 Body mass index is 43.46 kg/m.  Assessment: Debra [name] is a 55 y.o. female s/p laparotomy, excision of greater omental mass, abdominal wall reconstruction with [name], appendectomy, and placement of Prevena vac.  Glucose / Insulin:  --No apparent history of diabetes --CBG's 110-170, all < 180 --rSSI 4x/day + 20u insulin in TPN>> increase to 22 u on 12/16 --SSI: 12u last 24h Electrolytes: Within normal limits Renal: Scr at approximate baseline  Hepatic: LFTs trending up slightly GI Imaging: 9/11 CTAP: no new acute issues GI Surgeries / Procedures: s/p laparotomy, excision of greater omental mass, abdominal wall reconstruction with [name], appendectomy, and placement of Prevena vac  Central access: 02/15/22 TPN start date: 02/15/22  Nutritional Goals: Goal cyclic TPN over 16 hrs: cyclic TPN over 16 hrs: (provides 102 g of protein and 1817 kcals per day) total volume over 16hrs for calculations=1400 ml (1500 ml total with overfill)  RD Assessment:  Estimated Needs Total Energy Estimated Needs: 1800-2100kcal/day Total Protein Estimated Needs: 90-110g/d Total Fluid Estimated Needs: 1.4-1.6L/day  Current Nutrition:  Soft diet + nutritional supplements, not meeting PO needs yet   Plan: Transitioned to *Cyclic* TPN on 3/42.   to run over 16 hours per MD request. [name] at 2000 per discussion with Dietician to allow patient time for walking in afternoon/evenings.  To run over 16 hours: -Start rate at 49 mL/hr for 1 hour. -Increase rate to 98 mL/hr for 13 hours.  -Decrease rate to 49 mL/hr for 1 hour. -Decrease rate to 25 mL/hr for 1 hours, then stop.  Plan:  See cyclic TPN rate above Continue Nutritional Components Amino acids  (using 15% Clinisol): 102 g Dextrose 19% = 266 g Lipids (using 20% SMOFlipids): 50.4 g kCal: 1817/24h  Electrolytes in TPN: Na 21mq/L, K 464m/L, Ca 82m33mL, Mg 10 mEq/L, Phos 10 mmol/L, Cl:Ac 1:1 Continue MVI, trace elements, chromium and zinc per dietary recs. Continue CBG/SSI for Cyclic TPN:  -CBG 2 hrs after cyclic TPN start -CBG during middle of cyclic TPN infusion -CBG 1 hr after cylic TPN stopped -CBG while off TPN Continue resistant (=4 CBGs per 24 hr period) SSI + 22u insulin in TPN (increased from 20 to 22 units insulin on 12/16) Check TPN labs on Mon/Thurs at minimum Monitor for further need of diuresis and adjust as needed for fluid status Add zinc 5 mg to TPN 12/16.   [name] Clinical Pharmacist 08/23/2022

## 2022-08-23 NOTE — Plan of Care (Signed)
  Problem: Clinical Measurements: Goal: Will remain free from infection Outcome: Progressing   Problem: Nutrition: Goal: Adequate nutrition will be maintained Outcome: Progressing   Problem: Activity: Goal: Risk for activity intolerance will decrease Outcome: Progressing   Problem: Nutrition: Goal: Adequate nutrition will be maintained Outcome: Progressing   Problem: Coping: Goal: Level of anxiety will decrease Outcome: Progressing   Problem: Pain Managment: Goal: General experience of comfort will improve Outcome: Progressing

## 2022-08-23 NOTE — Progress Notes (Signed)
[facility]ESS [facility] (cpt [phone])  Hospital Day(s): 191.   Post op day(s): 189 Days Post-Op.   Interval History:  Patient seen and examined [name] last changed 12/13 by bedside RN No new issues She is on soft diet; on cyclic TPN Still reports intermittent suprapubic pain which limits ambulation.  Review of Systems:  Constitutional: denies fever, chills  HEENT: denies cough or congestion  Respiratory: denies any shortness of breath  Cardiovascular: denies chest pain or palpitations  Gastrointestinal: + Abdominal pain, denies N/V Genitourinary: denies burning with urination or urinary frequency Integumentary: + midline wound (stable)   Vital signs in last 24 hours: [min-max] current  Temp:  [97.7 F (36.5 C)-99.5 F (37.5 C)] 97.7 F (36.5 C) (12/16 0740) Pulse Rate:  [70-92] 70 (12/16 0740) Resp:  [16-18] 16 (12/16 0740) BP: (87-126)/(46-75) 91/49 (12/16 0740) SpO2:  [97 %-100 %] 98 % (12/16 0740) Weight:  [97.6 kg] 97.6 kg (12/16 0300)     Height: '4\' 11"'$  (149.9 cm) Weight: 97.6 kg BMI (Calculated): 43.04   Intake/Output last 2 shifts:  12/15 0701 - 12/16 0700 In: 120 [P.O.:120] Out: 400 [Urine:400]   Physical Exam:  Constitutional: alert, cooperative and no distress  Respiratory: breathing non-labored at rest  Cardiovascular: regular rate and sinus rhythm  Gastrointestinal: Soft, slight amount of tenderness in the suprapubic region, no RLQ tenderness, non-distended, no rebound/guarding. Integumentary: Midline wound open, peritoneum closed; granulating; there are three stomatized areas visible in the LUQ and LLQ portions of the wound, output remains feculent. There is hypergranulation tissue to the inferior wound edge.     Labs:     Latest Ref Rng & Units 08/11/2022    4:23 AM 08/01/2022    4:29 AM 07/13/2022    7:05 AM  CBC  WBC 4.0 - 10.5 K/uL 4.8  5.7  4.5   Hemoglobin 12.0 - 15.0 g/dL 7.8  8.4  9.9   Hematocrit 36.0 - 46.0 % 24.1   25.7  31.1   Platelets 150 - 400 K/uL 138  137  115       Latest Ref Rng & Units 08/21/2022    5:53 AM 08/18/2022    4:27 AM 08/14/2022    5:52 AM  CMP  Glucose 70 - 99 mg/dL 168  166  158   BUN 6 - 20 mg/dL '21  19  20   '$ Creatinine 0.44 - 1.00 mg/dL 0.30  0.34  0.42   Sodium 135 - 145 mmol/L 135  135  134   Potassium 3.5 - 5.1 mmol/L 4.4  4.1  4.5   Chloride 98 - 111 mmol/L 110  106  108   CO2 22 - 32 mmol/L '21  25  24   '$ Calcium 8.9 - 10.3 mg/dL 8.0  8.3  8.3   Total Protein 6.5 - 8.1 g/dL 6.2  6.7  7.1   Total Bilirubin 0.3 - 1.2 mg/dL 0.6  0.4  0.4   Alkaline Phos 38 - 126 U/L 149  183  204   AST 15 - 41 U/L 35  40  42   ALT 0 - 44 U/L 32  35  38     Imaging studies: No new pertinent imaging studies this AM   Assessment/Plan:  56 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   - Wound Care; Changed 12/13 by bedside RN; [name] pouch with "window" but not on intermittent or continuous suction. Patient seems to like this better and has started to manage effluent independently. She will need intermittent suction with Yankauer to control effluent. Likely be beneficial to suction before ambulation.   - Continue TPN; cyclic; monitor nutritional labs bi-weekly - Appreciate dietary assistance with trace elements; iron supplementation            - Monitor abdominal examination; on-going bowel function            - Pain control prn; antemetic prn  - Appreciate neurosurgery assistance with compression fracture; serial XRs   - Discharge Planning: Continue to anticipate lengthy admission and potential eventual transfer; no new progress regarding disposition; question whether or not she is currently insured. TOC note from 11/27  notes Medicaid now "in verification process"   I spent 35 minutes dedicated to the care of this patient on the date of this encounter to include pre-visit review of records, face-to-face time with the patient discussing diagnosis and management, and any post-visit coordination of care.  Olean Ree, MD [facility]

## 2022-08-24 ENCOUNTER — Inpatient Hospital Stay: Payer: Medicaid Other

## 2022-08-24 LAB — GLUCOSE, CAPILLARY
Glucose-Capillary: 167 mg/dL — ABNORMAL HIGH (ref 70–99)
Glucose-Capillary: 130 mg/dL — ABNORMAL HIGH (ref 70–99)
Glucose-Capillary: 104 mg/dL — ABNORMAL HIGH (ref 70–99)
Glucose-Capillary: 167 mg/dL — ABNORMAL HIGH (ref 70–99)

## 2022-08-24 MED ORDER — ZINC CHLORIDE 1 MG/ML IV SOLN
INTRAVENOUS | Status: AC
  Filled 2022-08-24: qty 681.33

## 2022-08-24 NOTE — Progress Notes (Signed)
PHARMACY - TOTAL PARENTERAL NUTRITION CONSULT NOTE   Indication: Prolonged ileus  Patient Measurements: Height: '4\' 11"'$  (149.9 cm) Weight: 97.6 kg (215 lb 2.7 oz) IBW/kg (Calculated) : 43.2 TPN AdjBW (KG): 55.2 Body mass index is 43.46 kg/m.  Assessment: Debra [name] is a 56 y.o. female s/p laparotomy, excision of greater omental mass, abdominal wall reconstruction with [name], appendectomy, and placement of Prevena vac.  Glucose / Insulin:  --No apparent history of diabetes --CBG's 110-170, all < 180 --rSSI 4x/day + 22u insulin in TPN --SSI: 4u last 24h Electrolytes: Within normal limits Renal: Scr at approximate baseline  Hepatic: LFTs trending up slightly GI Imaging: 9/11 CTAP: no new acute issues GI Surgeries / Procedures: s/p laparotomy, excision of greater omental mass, abdominal wall reconstruction with [name], appendectomy, and placement of Prevena vac  Central access: 02/15/22 TPN start date: 02/15/22  Nutritional Goals: Goal cyclic TPN over 16 hrs: cyclic TPN over 16 hrs: (provides 102 g of protein and 1817 kcals per day) total volume over 16hrs for calculations=1400 ml (1500 ml total with overfill)  RD Assessment:  Estimated Needs Total Energy Estimated Needs: 1800-2100kcal/day Total Protein Estimated Needs: 90-110g/d Total Fluid Estimated Needs: 1.4-1.6L/day  Current Nutrition:  Soft diet + nutritional supplements, not meeting PO needs yet   Plan: Transitioned to *Cyclic* TPN on 8/52.   to run over 16 hours per MD request. [name] at 2000 per discussion with Dietician to allow patient time for walking in afternoon/evenings.  To run over 16 hours: -Start rate at 49 mL/hr for 1 hour. -Increase rate to 98 mL/hr for 13 hours.  -Decrease rate to 49 mL/hr for 1 hour. -Decrease rate to 25 mL/hr for 1 hours, then stop.  Plan:  See cyclic TPN rate above Continue Nutritional Components Amino acids (using 15% Clinisol): 102  g Dextrose 19% = 266 g Lipids (using 20% SMOFlipids): 50.4 g kCal: 1817/24h  Electrolytes in TPN: Na 42mq/L, K 472m/L, Ca 37m48mL, Mg 10 mEq/L, Phos 10 mmol/L, Cl:Ac 1:1 Continue MVI, trace elements, chromium and zinc per dietary recs. Continue CBG/SSI for Cyclic TPN:  -CBG 2 hrs after cyclic TPN start -CBG during middle of cyclic TPN infusion -CBG 1 hr after cylic TPN stopped -CBG while off TPN Continue resistant (=4 CBGs per 24 hr period) SSI + 22u insulin in TPN (increased from 20 to 22 units insulin on Sat 12/16) Check TPN labs on Mon/Thurs at minimum Monitor for further need of diuresis and adjust as needed for fluid status zinc 5 mg added to TPN 12/16 per dietician recommendation.   [name] Clinical Pharmacist 08/24/2022

## 2022-08-24 NOTE — Progress Notes (Signed)
[facility] SUR[facility] PROGRESS NOTE (cpt [phone])  Hospital Day(s): 192.   Post op day(s): 190 Days Post-Op.   Interval History:  Patient seen and examined [name] last changed 12/16 due to leakage.  She is on soft diet; on cyclic TPN Still reports intermittent suprapubic pain which limits ambulation.  Review of Systems:  Constitutional: denies fever, chills  HEENT: denies cough or congestion  Respiratory: denies any shortness of breath  Cardiovascular: denies chest pain or palpitations  Gastrointestinal: + Abdominal pain, denies N/V Genitourinary: denies burning with urination or urinary frequency Integumentary: + midline wound (stable)   Vital signs in last 24 hours: [min-max] current  Temp:  [97.6 F (36.4 C)-99.4 F (37.4 C)] 98.3 F (36.8 C) (12/17 0728) Pulse Rate:  [84-89] 86 (12/17 0728) Resp:  [16-20] 18 (12/17 0728) BP: (100-122)/(49-61) 107/49 (12/17 0728) SpO2:  [96 %-100 %] 98 % (12/17 0728)     Height: '4\' 11"'$  (149.9 cm) Weight: 97.6 kg BMI (Calculated): 43.04   Intake/Output last 2 shifts:  12/16 0701 - 12/17 0700 In: -  Out: 830 [Drains:830]   Physical Exam:  Constitutional: alert, cooperative and no distress  Respiratory: breathing non-labored at rest  Cardiovascular: regular rate and sinus rhythm  Gastrointestinal: Soft, slight amount of tenderness at the middle of the pubic symphysis, no RLQ tenderness, non-distended, no rebound/guarding. Integumentary: Midline wound open, peritoneum closed; granulating; there are three stomatized areas visible in the LUQ and LLQ portions of the wound, output remains feculent. There is hypergranulation tissue to the inferior wound edge.     Labs:     Latest Ref Rng & Units 08/11/2022    4:23 AM 08/01/2022    4:29 AM 07/13/2022    7:05 AM  CBC  WBC 4.0 - 10.5 K/uL 4.8  5.7  4.5   Hemoglobin 12.0 - 15.0 g/dL 7.8  8.4  9.9   Hematocrit 36.0 - 46.0 % 24.1  25.7  31.1   Platelets 150 - 400 K/uL 138  137   115       Latest Ref Rng & Units 08/21/2022    5:53 AM 08/18/2022    4:27 AM 08/14/2022    5:52 AM  CMP  Glucose 70 - 99 mg/dL 168  166  158   BUN 6 - 20 mg/dL '21  19  20   '$ Creatinine 0.44 - 1.00 mg/dL 0.30  0.34  0.42   Sodium 135 - 145 mmol/L 135  135  134   Potassium 3.5 - 5.1 mmol/L 4.4  4.1  4.5   Chloride 98 - 111 mmol/L 110  106  108   CO2 22 - 32 mmol/L '21  25  24   '$ Calcium 8.9 - 10.3 mg/dL 8.0  8.3  8.3   Total Protein 6.5 - 8.1 g/dL 6.2  6.7  7.1   Total Bilirubin 0.3 - 1.2 mg/dL 0.6  0.4  0.4   Alkaline Phos 38 - 126 U/L 149  183  204   AST 15 - 41 U/L 35  40  42   ALT 0 - 44 U/L 32  35  38     Imaging studies: No new pertinent imaging studies this AM   Assessment/Plan:  56 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   -  Wound Care; Changed 12/16 by bedside RN; Shirlean Schlein pouch with "window" but not on intermittent or continuous suction. Patient seems to like this better and has started to manage effluent independently. She will need intermittent suction with Yankauer to control effluent. Likely be beneficial to suction before ambulation.   - Continue TPN; cyclic; monitor nutritional labs bi-weekly - Appreciate dietary assistance with trace elements; iron supplementation            - Monitor abdominal examination; on-going bowel function            - Pain control prn; antemetic prn  - Appreciate neurosurgery assistance with compression fracture; serial XRs   - Discharge Planning: Continue to anticipate lengthy admission and potential eventual transfer; no new progress regarding disposition; question whether or not she is currently insured. TOC note from 11/27 notes Medicaid now "in verification process"   I  spent 35 minutes dedicated to the care of this patient on the date of this encounter to include pre-visit review of records, face-to-face time with the patient discussing diagnosis and management, and any post-visit coordination of care.  Olean Ree, MD [facility]

## 2022-08-24 NOTE — Plan of Care (Signed)
  Problem: Clinical Measurements: Goal: Will remain free from infection Outcome: Progressing   Problem: Nutrition: Goal: Adequate nutrition will be maintained Outcome: Progressing   Problem: Activity: Goal: Risk for activity intolerance will decrease Outcome: Progressing   Problem: Nutrition: Goal: Adequate nutrition will be maintained Outcome: Progressing   Problem: Coping: Goal: Level of anxiety will decrease Outcome: Progressing   Problem: Pain Managment: Goal: General experience of comfort will improve Outcome: Progressing

## 2022-08-25 LAB — COMPREHENSIVE METABOLIC PANEL
ALT: 24 U/L (ref 0–44)
AST: 26 U/L (ref 15–41)
Albumin: 2.3 g/dL — ABNORMAL LOW (ref 3.5–5.0)
Alkaline Phosphatase: 128 U/L — ABNORMAL HIGH (ref 38–126)
Anion gap: 4 — ABNORMAL LOW (ref 5–15)
BUN: 21 mg/dL — ABNORMAL HIGH (ref 6–20)
CO2: 24 mmol/L (ref 22–32)
Calcium: 8.3 mg/dL — ABNORMAL LOW (ref 8.9–10.3)
Chloride: 107 mmol/L (ref 98–111)
Creatinine, Ser: 0.45 mg/dL (ref 0.44–1.00)
GFR, Estimated: >60 mL/min (ref >60)
Glucose, Bld: 188 mg/dL — ABNORMAL HIGH (ref 70–99)
Potassium: 4 mmol/L (ref 3.5–5.1)
Sodium: 135 mmol/L (ref 135–145)
Total Bilirubin: 0.5 mg/dL (ref 0.3–1.2)
Total Protein: 5.9 g/dL — ABNORMAL LOW (ref 6.5–8.1)

## 2022-08-25 LAB — GLUCOSE, CAPILLARY
Glucose-Capillary: 152 mg/dL — ABNORMAL HIGH (ref 70–99)
Glucose-Capillary: 125 mg/dL — ABNORMAL HIGH (ref 70–99)
Glucose-Capillary: 88 mg/dL (ref 70–99)
Glucose-Capillary: 134 mg/dL — ABNORMAL HIGH (ref 70–99)

## 2022-08-25 LAB — MAGNESIUM: Magnesium: 2 mg/dL (ref 1.7–2.4)

## 2022-08-25 LAB — PHOSPHORUS: Phosphorus: 3.3 mg/dL (ref 2.5–4.6)

## 2022-08-25 MED ORDER — ZINC CHLORIDE 1 MG/ML IV SOLN
INTRAVENOUS | Status: AC
  Filled 2022-08-25: qty 681.33

## 2022-08-25 NOTE — Progress Notes (Signed)
   08/25/22 0900  Clinical Encounter Type  Visited With Patient  Visit Type Follow-up  Spiritual Encounters  Spiritual Needs Emotional;Prayer   Chaplain noted that he had not seen patient walking in hallways like before and so followed-up on patient. Patient struggling with long hospital stay and not recovering. Chaplain provided comfort through meaningful conversation and prayer.

## 2022-08-25 NOTE — Plan of Care (Signed)
  Problem: Clinical Measurements: Goal: Will remain free from infection Outcome: Progressing   Problem: Nutrition: Goal: Adequate nutrition will be maintained Outcome: Progressing   Problem: Activity: Goal: Risk for activity intolerance will decrease Outcome: Progressing   Problem: Nutrition: Goal: Adequate nutrition will be maintained Outcome: Progressing   Problem: Coping: Goal: Level of anxiety will decrease Outcome: Progressing   Problem: Pain Managment: Goal: General experience of comfort will improve Outcome: Progressing

## 2022-08-25 NOTE — Progress Notes (Signed)
PHARMACY - TOTAL PARENTERAL NUTRITION CONSULT NOTE   Indication: Prolonged ileus  Patient Measurements: Height: '4\' 11"'$  (149.9 cm) Weight: 97.6 kg (215 lb 2.7 oz) IBW/kg (Calculated) : 43.2 TPN AdjBW (KG): 55.2 Body mass index is 43.46 kg/m.  Assessment: Debra [name] is a 56 y.o. female s/p laparotomy, excision of greater omental mass, abdominal wall reconstruction with [name], appendectomy, and placement of Prevena vac.  Glucose / Insulin:  --No apparent history of diabetes --CBG's 104 - 167, all < 180 --rSSI 4x/day + 22u insulin in TPN --SSI: 10u last 24h Electrolytes: Within normal limits Renal: Scr at approximate baseline  Hepatic: LFTs trending up slightly GI Imaging: 9/11 CTAP: no new acute issues GI Surgeries / Procedures: s/p laparotomy, excision of greater omental mass, abdominal wall reconstruction with [name], appendectomy, and placement of Prevena vac  Central access: 02/15/22 TPN start date: 02/15/22  Nutritional Goals: Goal cyclic TPN over 16 hrs: cyclic TPN over 16 hrs: (provides 102 g of protein and 1817 kcals per day) total volume over 16hrs for calculations=1400 ml (1500 ml total with overfill)  RD Assessment:  Estimated Needs Total Energy Estimated Needs: 1800-2100kcal/day Total Protein Estimated Needs: 90-110g/d Total Fluid Estimated Needs: 1.4-1.6L/day  Current Nutrition:  Soft diet + nutritional supplements, not meeting PO needs yet   Plan: Transitioned to *Cyclic* TPN on 2/48.   to run over 16 hours per MD request. [name] at 2000 per discussion with Dietician to allow patient time for walking in afternoon/evenings.  To run over 16 hours: -Start rate at 49 mL/hr for 1 hour. -Increase rate to 98 mL/hr for 13 hours.  -Decrease rate to 49 mL/hr for 1 hour. -Decrease rate to 25 mL/hr for 1 hours, then stop.  Plan:  See cyclic TPN rate above Continue Nutritional Components Amino acids (using 15% Clinisol): 102  g Dextrose 19% = 266 g Lipids (using 20% SMOFlipids): 50.4 g kCal: 1817/24h  Electrolytes in TPN: Na 30mq/L, K 472m/L, Ca 71m48mL, Mg 10 mEq/L, Phos 10 mmol/L, Cl:Ac 1:1 Continue MVI, trace elements, chromium and zinc per dietary recs. Continue CBG/SSI for Cyclic TPN:  -CBG 2 hrs after cyclic TPN start -CBG during middle of cyclic TPN infusion -CBG 1 hr after cylic TPN stopped -CBG while off TPN Continue resistant SSI + 22u insulin in TPN (increased from 20 to 22 units insulin on Sat 12/16) Check TPN labs on Mon/Thurs at minimum Monitor for further need of diuresis and adjust as needed for fluid status zinc 5 mg added to TPN 12/16 per dietician recommendation.   RodVallery SaharmD, BCPS Clinical Pharmacist 08/25/2022

## 2022-08-25 NOTE — Progress Notes (Signed)
[facility] SURGICAL PROGRESS NOTE (cpt [phone])  Hospital Day(s): 193.   Post op day(s): 191 Days Post-Op.   Interval History:  Patient seen and examined Over the weekend continued to have suprapubic pain; XR reviewed; nothing acute  [name] last changed 12/13 by bedside RN Nutritional labs are reassuring [name] output 1050 ccs + unmeasured; she is starting to manage effluent independently She is on soft diet; on cyclic TPN  Review of Systems:  Constitutional: denies fever, chills  HEENT: denies cough or congestion  Respiratory: denies any shortness of breath  Cardiovascular: denies chest pain or palpitations  Gastrointestinal: + Abdominal pain, denies N/V Genitourinary: denies burning with urination or urinary frequency Integumentary: + midline wound (stable)   Vital signs in last 24 hours: [min-max] current  Temp:  [98.1 F (36.7 C)-98.8 F (37.1 C)] 98.2 F (36.8 C) (12/18 0819) Pulse Rate:  [76-87] 81 (12/18 0819) Resp:  [15-18] 16 (12/18 0819) BP: (88-105)/(51-75) 105/75 (12/18 0819) SpO2:  [96 %-100 %] 98 % (12/18 0819)     Height: '4\' 11"'$  (149.9 cm) Weight: 97.6 kg BMI (Calculated): 43.04   Intake/Output last 2 shifts:  12/17 0701 - 12/18 0700 In: 1002.7 [I.V.:1002.7] Out: 1050 [Drains:1050]   Physical Exam:  Constitutional: alert, cooperative and no distress  Respiratory: breathing non-labored at rest  Cardiovascular: regular rate and sinus rhythm  Gastrointestinal: Soft, slight amount of tenderness in the suprapubic region, no RLQ tenderness, non-distended, no rebound/guarding. Integumentary: Midline wound open, peritoneum closed; granulating; there are three stomatized areas visible in the LUQ and LLQ portions of the wound, output remains feculent. There is hypergranulation tissue to the inferior wound edge.     Labs:     Latest Ref Rng & Units 08/11/2022    4:23 AM 08/01/2022    4:29 AM 07/13/2022    7:05 AM  CBC  WBC 4.0 - 10.5 K/uL 4.8   5.7  4.5   Hemoglobin 12.0 - 15.0 g/dL 7.8  8.4  9.9   Hematocrit 36.0 - 46.0 % 24.1  25.7  31.1   Platelets 150 - 400 K/uL 138  137  115       Latest Ref Rng & Units 08/25/2022    3:27 AM 08/21/2022    5:53 AM 08/18/2022    4:27 AM  CMP  Glucose 70 - 99 mg/dL 188  168  166   BUN 6 - 20 mg/dL '21  21  19   '$ Creatinine 0.44 - 1.00 mg/dL 0.45  0.30  0.34   Sodium 135 - 145 mmol/L 135  135  135   Potassium 3.5 - 5.1 mmol/L 4.0  4.4  4.1   Chloride 98 - 111 mmol/L 107  110  106   CO2 22 - 32 mmol/L '24  21  25   '$ Calcium 8.9 - 10.3 mg/dL 8.3  8.0  8.3   Total Protein 6.5 - 8.1 g/dL 5.9  6.2  6.7   Total Bilirubin 0.3 - 1.2 mg/dL 0.5  0.6  0.4   Alkaline Phos 38 - 126 U/L 128  149  183   AST 15 - 41 U/L 26  35  40   ALT 0 - 44 U/L 24  32  35     Imaging studies: No new pertinent imaging studies this AM   Assessment/Plan:  56 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   - New 12/18: Continues to have supra-pubic pain, XR negative, multiple CT negative. Source unclear.   - Wound Care; Changed 12/13 by bedside RN; [name] pouch with "window" but not on intermittent or continuous suction. Patient seems to like this better and has started to manage effluent independently. She will need intermittent suction with Yankauer to control effluent. Likely be beneficial to suction before ambulation.   - Continue TPN; cyclic; monitor nutritional labs bi-weekly - Appreciate dietary assistance with trace elements; iron supplementation            - Monitor abdominal examination; on-going bowel function            - Pain control prn; antemetic prn            - Progressed with therapies; no longer any recommendations  - Appreciate  neurosurgery assistance with compression fracture; serial XRs   - Discharge Planning: Continue to anticipate lengthy admission and potential eventual transfer; no new progress regarding disposition; question whether or not she is currently insured. TOC note from 11/27 notes Medicaid now "in verification process"   All of the above findings and recommendations were discussed with the patient, and the medical team, and all of patient's questions were answered to her expressed satisfaction.  -- [name], PA-C [facility] 08/25/2022, 8:59 AM M-F: 7am - 4pm

## 2022-08-26 ENCOUNTER — Inpatient Hospital Stay: Payer: Medicaid Other

## 2022-08-26 LAB — GLUCOSE, CAPILLARY
Glucose-Capillary: 149 mg/dL — ABNORMAL HIGH (ref 70–99)
Glucose-Capillary: 144 mg/dL — ABNORMAL HIGH (ref 70–99)
Glucose-Capillary: 123 mg/dL — ABNORMAL HIGH (ref 70–99)
Glucose-Capillary: 79 mg/dL (ref 70–99)
Glucose-Capillary: 159 mg/dL — ABNORMAL HIGH (ref 70–99)

## 2022-08-26 MED ORDER — ZINC CHLORIDE 1 MG/ML IV SOLN
INTRAVENOUS | Status: AC
  Filled 2022-08-26: qty 681.33

## 2022-08-26 NOTE — Progress Notes (Signed)
PHARMACY - TOTAL PARENTERAL NUTRITION CONSULT NOTE   Indication: Prolonged ileus  Patient Measurements: Height: '4\' 11"'$  (149.9 cm) Weight: 95.3 kg (210 lb 1.6 oz) IBW/kg (Calculated) : 43.2 TPN AdjBW (KG): 55.2 Body mass index is 42.43 kg/m.  Assessment: Caira Momo Braun is a 56 y.o. female s/p laparotomy, excision of greater omental mass, abdominal wall reconstruction with [name], appendectomy, and placement of Prevena vac.  Glucose / Insulin:  --No apparent history of diabetes --CBG's 104 - 167, all < 180 --rSSI 4x/day + 22u insulin in TPN --SSI: 9u last 24h Electrolytes: Within normal limits Renal: Scr at approximate baseline  Hepatic: LFTs trending up slightly GI Imaging: 9/11 CTAP: no new acute issues GI Surgeries / Procedures: s/p laparotomy, excision of greater omental mass, abdominal wall reconstruction with [name], appendectomy, and placement of Prevena vac  Central access: 02/15/22 TPN start date: 02/15/22  Nutritional Goals: Goal cyclic TPN over 16 hrs: cyclic TPN over 16 hrs: (provides 102 g of protein and 1817 kcals per day) total volume over 16hrs for calculations=1400 ml (1500 ml total with overfill)  RD Assessment:  Estimated Needs Total Energy Estimated Needs: 1800-2100kcal/day Total Protein Estimated Needs: 90-110g/d Total Fluid Estimated Needs: 1.4-1.6L/day  Current Nutrition:  Soft diet + nutritional supplements, not meeting PO needs yet   Plan: Transitioned to *Cyclic* TPN on 8/34.   to run over 16 hours per MD request. [name] at 2000 per discussion with Dietician to allow patient time for walking in afternoon/evenings.  To run over 16 hours: -Start rate at 49 mL/hr for 1 hour. -Increase rate to 98 mL/hr for 13 hours.  -Decrease rate to 49 mL/hr for 1 hour. -Decrease rate to 25 mL/hr for 1 hours, then stop.  Plan:  See cyclic TPN rate above Continue Nutritional Components Amino acids (using 15% Clinisol): 102  g Dextrose 19% = 266 g Lipids (using 20% SMOFlipids): 50.4 g kCal: 1817/24h  Electrolytes in TPN: Na 352mq/L, K 4352m/L, Ca 52m44mL, Mg 10 mEq/L, Phos 10 mmol/L, Cl:Ac 1:1 Continue MVI, trace elements, chromium and zinc per dietary recs. Continue CBG/SSI for Cyclic TPN:  -CBG 2 hrs after cyclic TPN start -CBG during middle of cyclic TPN infusion -CBG 1 hr after cylic TPN stopped -CBG while off TPN Continue resistant SSI + 22u insulin in TPN Check TPN labs on Mon/Thurs at minimum Monitor for further need of diuresis and adjust as needed for fluid status zinc 5 mg added to TPN 12/16 per dietician recommendation.  [name] Clinical Pharmacist 08/26/2022

## 2022-08-26 NOTE — Progress Notes (Signed)
Nutrition Follow-up  DOCUMENTATION CODES:   Obesity unspecified  INTERVENTION:   Continue cyclic TPN per pharmacy (16 hrs)- provides 1817kcal/day and 102g/day protein   Ensure Enlive po TID, each supplement provides 350 kcal and 20 grams of protein.  MVI, chromium and trace elements in TPN   Daily weights   Vitamin D2- 50,000 units po weekly   Zinc 49m daily added to TPN   Manganese lab pending   NUTRITION DIAGNOSIS:   Increased nutrient needs related to wound healing, catabolic illness as evidenced by estimated needs.  GOAL:   Patient will meet greater than or equal to 90% of their needs -met with TPN   MONITOR:   PO intake, Supplement acceptance, Labs, Weight trends, Diet advancement, I & O's, TPN  ASSESSMENT:   56y/o female with h/o hypothyroidism, COVID 19 (3/21), kidney stones and stage 3 colon cancer (s/p left hemicolectomy 5/21 and chemotherapy) who is admitted for new pelvic mass now s/p laparotomy 6/8 (with excision of pelvic mass from greater omentum, abdominal wall reconstruction with bilateral myocutaneous flaps and mesh, incisional hernia repair, appendectomy repair and VAC placement) complicated by bowel perforation s/p reopening of recent laparotomy 6/10 (with repair of small bowel perforation, excision of mesh, placement of two phasix mesh and VAC placement). Pathology returned as metastatic adenocarcinoma. Pt with L1 compression fracture.   Pt continues to tolerate TPN well at goal rate; TPN is currently being cycled for 16 hours. Triglycerides wnl and are being checked monthly. Hyperglycemia improved with insulin in TPN. Volume status improved with diuresis; pt remains up ~13lbs from admission. Vitamin labs improved; will continue to monitor. Pt continues to have poor oral intake. [name] pouch remains in place with 5532moutput. No discharge plan in place.   Medications reviewed and include: D2, insulin, synthroid, protonix, carafate  -Selenium 93(L),  Iodine 176.8(H), Chromium 2.3(H), copper 118 wnl, zinc 61 wnl - 12/7 -Manganese 25.6(H) - 11/2 -Iron 32.0 wnl, TIBC 330, ferritin 54 wnl, folate 14.4 wnl, B12 769- 12/7 -Vitamin B1- 91.2, vitamin D- 47.88, vitamin A- 21.5, B6- 22.8 wnl, vitamin C- 0.5, vitamin E- 12.7 wnl, vitamin K 0.82- 12/7   Labs reviewed: Na 135 wnl, K 4.0 wnl, BUN 21(H), P 3.3 wnl, Mg 2.0 wnl- 12/18 Triglycerides- 83-12/15 Cbgs- 123, 144, 149 x 24 hrs  Diet Order:    Diet Order             DIET SOFT Room service appropriate? Yes; Fluid consistency: Thin  Diet effective now                  EDUCATION NEEDS:   Not appropriate for education at this time  Skin:  Skin Assessment: Reviewed RN Assessment ( Midline wound: 11cm x 12cm )  Last BM:  12/17- TYPE 6  Height:   Ht Readings from Last 1 Encounters:  03/01/22 _0  (1.499 m)    Weight:   Wt Readings from Last 1 Encounters:  08/26/22 95.3 kg    Ideal Body Weight:  44.3 kg  BMI:  Body mass index is 42.43 kg/m.  Estimated Nutritional Needs:   Kcal:  1800-2100kcal/day  Protein:  90-110g/d  Fluid:  1.4-1.6L/day  CaKoleen DistanceS, RD, LDN Please refer to [facility]View[facility] Centeror RD and/or RD on-call/weekend/after hours pager

## 2022-08-26 NOTE — Progress Notes (Signed)
[facility] SURGICAL PROGRESS NOTE (cpt [phone])  Hospital Day(s): 194.   Post op day(s): 192 Days Post-Op.   Interval History:  Patient seen and examined [name] last changed 12/13 by bedside RN No new labs today [name] output 550 ccs + unmeasured; she is starting to manage effluent independently She is on soft diet; on cyclic TPN  Review of Systems:  Constitutional: denies fever, chills  HEENT: denies cough or congestion  Respiratory: denies any shortness of breath  Cardiovascular: denies chest pain or palpitations  Gastrointestinal: + Abdominal pain, denies N/V Genitourinary: denies burning with urination or urinary frequency Integumentary: + midline wound (stable)   Vital signs in last 24 hours: [min-max] current  Temp:  [98 F (36.7 C)-98.2 F (36.8 C)] 98 F (36.7 C) (12/19 0546) Pulse Rate:  [71-81] 73 (12/19 0546) Resp:  [16-20] 19 (12/19 0546) BP: (96-105)/(54-75) 96/54 (12/19 0546) SpO2:  [96 %-100 %] 98 % (12/19 0546) Weight:  [95.3 kg] 95.3 kg (12/19 0500)     Height: '4\' 11"'$  (149.9 cm) Weight: 95.3 kg BMI (Calculated): 43.04   Intake/Output last 2 shifts:  12/18 0701 - 12/19 0700 In: 1709.5 [P.O.:120; I.V.:1589.5] Out: 550 [Drains:550]   Physical Exam:  Constitutional: alert, cooperative and no distress  Respiratory: breathing non-labored at rest  Cardiovascular: regular rate and sinus rhythm  Gastrointestinal: Soft, slight amount of tenderness in the suprapubic region, no RLQ tenderness, non-distended, no rebound/guarding. Integumentary: Midline wound open, peritoneum closed; granulating; there are three stomatized areas visible in the LUQ and LLQ portions of the wound, output remains feculent. There is hypergranulation tissue to the inferior wound edge.     Labs:     Latest Ref Rng & Units 08/11/2022    4:23 AM 08/01/2022    4:29 AM 07/13/2022    7:05 AM  CBC  WBC 4.0 - 10.5 K/uL 4.8  5.7  4.5   Hemoglobin 12.0 - 15.0 g/dL 7.8  8.4  9.9    Hematocrit 36.0 - 46.0 % 24.1  25.7  31.1   Platelets 150 - 400 K/uL 138  137  115       Latest Ref Rng & Units 08/25/2022    3:27 AM 08/21/2022    5:53 AM 08/18/2022    4:27 AM  CMP  Glucose 70 - 99 mg/dL 188  168  166   BUN 6 - 20 mg/dL '21  21  19   '$ Creatinine 0.44 - 1.00 mg/dL 0.45  0.30  0.34   Sodium 135 - 145 mmol/L 135  135  135   Potassium 3.5 - 5.1 mmol/L 4.0  4.4  4.1   Chloride 98 - 111 mmol/L 107  110  106   CO2 22 - 32 mmol/L '24  21  25   '$ Calcium 8.9 - 10.3 mg/dL 8.3  8.0  8.3   Total Protein 6.5 - 8.1 g/dL 5.9  6.2  6.7   Total Bilirubin 0.3 - 1.2 mg/dL 0.5  0.6  0.4   Alkaline Phos 38 - 126 U/L 128  149  183   AST 15 - 41 U/L 26  35  40   ALT 0 - 44 U/L 24  32  35     Imaging studies: No new pertinent imaging studies this AM   Assessment/Plan:  56 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   - New 12/19: Nothing new  - Wound Care; Changed 12/13 by bedside RN; [name] pouch with "window" but not on intermittent or continuous suction. Patient seems to like this better and has started to manage effluent independently. She will need intermittent suction with Yankauer to control effluent. Likely be beneficial to suction before ambulation.   - Continue TPN; cyclic; monitor nutritional labs bi-weekly - Appreciate dietary assistance with trace elements; iron supplementation            - Monitor abdominal examination; on-going bowel function            - Pain control prn; antemetic prn            - Progressed with therapies; no longer any recommendations  - Appreciate neurosurgery assistance with compression fracture; serial XRs   - Discharge Planning: Continue to anticipate lengthy admission and  potential eventual transfer; no new progress regarding disposition; question whether or not she is currently insured. TOC note from 11/27 notes Medicaid now "in verification process"   All of the above findings and recommendations were discussed with the patient, and the medical team, and all of patient's questions were answered to her expressed satisfaction.  -- [name], PA-C [facility] 08/26/2022, 7:48 AM M-F: 7am - 4pm

## 2022-08-27 LAB — GLUCOSE, CAPILLARY
Glucose-Capillary: 149 mg/dL — ABNORMAL HIGH (ref 70–99)
Glucose-Capillary: 118 mg/dL — ABNORMAL HIGH (ref 70–99)
Glucose-Capillary: 121 mg/dL — ABNORMAL HIGH (ref 70–99)
Glucose-Capillary: 80 mg/dL (ref 70–99)
Glucose-Capillary: 154 mg/dL — ABNORMAL HIGH (ref 70–99)

## 2022-08-27 MED ORDER — ZINC CHLORIDE 1 MG/ML IV SOLN
INTRAVENOUS | Status: AC
  Filled 2022-08-27: qty 681.33

## 2022-08-27 MED ORDER — KETOROLAC TROMETHAMINE 15 MG/ML IJ SOLN
15.0000 mg | Freq: Four times a day (QID) | INTRAMUSCULAR | Status: AC | PRN
  Administered 2022-08-28 – 2022-08-29 (×2): 15 mg via INTRAVENOUS
  Filled 2022-08-27 (×2): qty 1

## 2022-08-27 NOTE — Progress Notes (Signed)
[facility] SURGICAL PROGRESS NOTE (cpt [phone])  Hospital Day(s): 195.   Post op day(s): 193 Days Post-Op.   Interval History:  Patient seen and examined Still with RLQ > suprapubic abdominal discomfort No nausea/emesis [name] last changed 12/13 by bedside RN No new labs today Reviewed repeat L-spine XR; appreciate neurosurgery following for this [name] output 600 ccs + unmeasured; she is starting to manage effluent independently She is on soft diet; on cyclic TPN  Review of Systems:  Constitutional: denies fever, chills  HEENT: denies cough or congestion  Respiratory: denies any shortness of breath  Cardiovascular: denies chest pain or palpitations  Gastrointestinal: + Abdominal pain, denies N/V Genitourinary: denies burning with urination or urinary frequency Integumentary: + midline wound (stable)   Vital signs in last 24 hours: [min-max] current  Temp:  [98 F (36.7 C)-98.7 F (37.1 C)] 98.3 F (36.8 C) (12/20 0523) Pulse Rate:  [70-88] 78 (12/20 0523) Resp:  [16-20] 20 (12/20 0523) BP: (100-123)/(45-109) 106/51 (12/20 0523) SpO2:  [95 %-100 %] 97 % (12/20 0523) Weight:  [95.2 kg] 95.2 kg (12/20 0500)     Height: '4\' 11"'$  (149.9 cm) Weight: 95.2 kg BMI (Calculated): 43.04   Intake/Output last 2 shifts:  12/19 0701 - 12/20 0700 In: -  Out: 600 [Drains:600]   Physical Exam:  Constitutional: alert, cooperative and no distress  Respiratory: breathing non-labored at rest  Cardiovascular: regular rate and sinus rhythm  Gastrointestinal: Soft, again with RLQ tenderness and suprapubic tenderness, non-distended, no rebound/guarding. Integumentary: Midline wound open, peritoneum closed; granulating; there are three stomatized areas visible in the LUQ and LLQ portions of the wound, output remains feculent. There is hypergranulation tissue to the inferior wound edge.     Labs:     Latest Ref Rng & Units 08/11/2022    4:23 AM 08/01/2022    4:29 AM 07/13/2022     7:05 AM  CBC  WBC 4.0 - 10.5 K/uL 4.8  5.7  4.5   Hemoglobin 12.0 - 15.0 g/dL 7.8  8.4  9.9   Hematocrit 36.0 - 46.0 % 24.1  25.7  31.1   Platelets 150 - 400 K/uL 138  137  115       Latest Ref Rng & Units 08/25/2022    3:27 AM 08/21/2022    5:53 AM 08/18/2022    4:27 AM  CMP  Glucose 70 - 99 mg/dL 188  168  166   BUN 6 - 20 mg/dL '21  21  19   '$ Creatinine 0.44 - 1.00 mg/dL 0.45  0.30  0.34   Sodium 135 - 145 mmol/L 135  135  135   Potassium 3.5 - 5.1 mmol/L 4.0  4.4  4.1   Chloride 98 - 111 mmol/L 107  110  106   CO2 22 - 32 mmol/L '24  21  25   '$ Calcium 8.9 - 10.3 mg/dL 8.3  8.0  8.3   Total Protein 6.5 - 8.1 g/dL 5.9  6.2  6.7   Total Bilirubin 0.3 - 1.2 mg/dL 0.5  0.6  0.4   Alkaline Phos 38 - 126 U/L 128  149  183   AST 15 - 41 U/L 26  35  40   ALT 0 - 44 U/L 24  32  35     Imaging studies: No new pertinent imaging studies this AM   Assessment/Plan:  56 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   - New 12/20: Again complaining of RLQ > Suprapubic abdominal pain, although work up over the last few weeks has failed to show any etiology of this discomfort. [name] without issues.   - Wound Care; Changed 12/13 by bedside RN; [name] pouch with "window" but not on intermittent or continuous suction. Patient seems to like this better and has started to manage effluent independently. She will need intermittent suction with Yankauer to control effluent. Likely be beneficial to suction before ambulation.   - Continue TPN; cyclic; monitor nutritional labs bi-weekly - Appreciate dietary assistance with trace elements; iron supplementation            - Monitor abdominal examination; on-going bowel function            -  Pain control prn; antemetic prn            - Progressed with therapies; no longer any recommendations  - Appreciate neurosurgery assistance with compression fracture; serial XRs   - Discharge Planning: Continue to anticipate lengthy admission and potential eventual transfer; no new progress regarding disposition   All of the above findings and recommendations were discussed with the patient, and the medical team, and all of patient's questions were answered to her expressed satisfaction.  -- [name], PA-C [facility] 08/27/2022, 7:27 AM M-F: 7am - 4pm

## 2022-08-27 NOTE — Progress Notes (Signed)
PHARMACY - TOTAL PARENTERAL NUTRITION CONSULT NOTE   Indication: Prolonged ileus  Patient Measurements: Height: '4\' 11"'$  (149.9 cm) Weight: 95.2 kg (209 lb 14.1 oz) IBW/kg (Calculated) : 43.2 TPN AdjBW (KG): 55.2 Body mass index is 42.39 kg/m.  Assessment: Debra [name] is a 56 y.o. female s/p laparotomy, excision of greater omental mass, abdominal wall reconstruction with [name], appendectomy, and placement of Prevena vac.  Glucose / Insulin:  --No apparent history of diabetes --CBG's 79 - 159, all < 180 --rSSI 4x/day + 22u insulin in TPN --SSI: 10u last 24h Electrolytes: Within normal limits Renal: Scr at approximate baseline  Hepatic: LFTs, bilirubin wnl GI Imaging: 9/11 CTAP: no new acute issues GI Surgeries / Procedures: s/p laparotomy, excision of greater omental mass, abdominal wall reconstruction with [name], appendectomy, and placement of Prevena vac  Central access: 02/15/22 TPN start date: 02/15/22  Nutritional Goals: Goal cyclic TPN over 16 hrs: cyclic TPN over 16 hrs: (provides 102 g of protein and 1817 kcals per day) total volume over 16hrs for calculations=1400 ml (1500 ml total with overfill)  RD Assessment:  Estimated Needs Total Energy Estimated Needs: 1800-2100kcal/day Total Protein Estimated Needs: 90-110g/d Total Fluid Estimated Needs: 1.4-1.6L/day  Current Nutrition:  Soft diet + nutritional supplements, not meeting PO needs yet   Plan: Transitioned to *Cyclic* TPN on 1/17.   to run over 16 hours per MD request. [name] at 2000 per discussion with Dietician to allow patient time for walking in afternoon/evenings.  To run over 16 hours: -Start rate at 49 mL/hr for 1 hour. -Increase rate to 98 mL/hr for 13 hours.  -Decrease rate to 49 mL/hr for 1 hour. -Decrease rate to 25 mL/hr for 1 hours, then stop.  Plan:  See cyclic TPN rate above Continue Nutritional Components Amino acids (using 15% Clinisol): 102  g Dextrose 19% = 266 g Lipids (using 20% SMOFlipids): 50.4 g kCal: 1817/24h  Electrolytes in TPN: Na 20mq/L, K 453m/L, Ca 46m7mL, Mg 10 mEq/L, Phos 10 mmol/L, Cl:Ac 1:1 Continue MVI, trace elements, chromium and zinc per dietary recs. Continue CBG/SSI for Cyclic TPN:  -CBG 2 hrs after cyclic TPN start -CBG during middle of cyclic TPN infusion -CBG 1 hr after cylic TPN stopped -CBG while off TPN Continue resistant SSI + 22u insulin in TPN Check TPN labs on Mon/Thurs at minimum Monitor for further need of diuresis and adjust as needed for fluid status zinc 5 mg added to TPN 12/16 per dietician recommendation.  [name] Clinical Pharmacist 08/27/2022

## 2022-08-27 NOTE — Consult Note (Signed)
[facility] Nurse wound follow up In to assess fistula and change pouch as my last recorded pouch change was 08/20/22.  Bedside RN, Minette Brine stated that she changed it again 08/25/22 due to leaking. The pouch is intact today with an excellent seal.  Patient will empty when needed and connect to intermittent suction to empty effluent and maintain seal. There were no further WOC needs identified today.  Our team will continue to monitor and ensure pouch changes occur every 7-10 days.  Will follow.  Estrellita Ludwig MSN, RN, FNP-BC CWON Wound, Ostomy, Continence Nurse [facility] [phone] Pager [phone]

## 2022-08-28 LAB — GLUCOSE, CAPILLARY
Glucose-Capillary: 157 mg/dL — ABNORMAL HIGH (ref 70–99)
Glucose-Capillary: 143 mg/dL — ABNORMAL HIGH (ref 70–99)
Glucose-Capillary: 126 mg/dL — ABNORMAL HIGH (ref 70–99)
Glucose-Capillary: 97 mg/dL (ref 70–99)
Glucose-Capillary: 127 mg/dL — ABNORMAL HIGH (ref 70–99)
Glucose-Capillary: 155 mg/dL — ABNORMAL HIGH (ref 70–99)

## 2022-08-28 LAB — COMPREHENSIVE METABOLIC PANEL
ALT: 23 U/L (ref 0–44)
AST: 32 U/L (ref 15–41)
Albumin: 2.5 g/dL — ABNORMAL LOW (ref 3.5–5.0)
Alkaline Phosphatase: 133 U/L — ABNORMAL HIGH (ref 38–126)
Anion gap: 5 (ref 5–15)
BUN: 21 mg/dL — ABNORMAL HIGH (ref 6–20)
CO2: 23 mmol/L (ref 22–32)
Calcium: 8.4 mg/dL — ABNORMAL LOW (ref 8.9–10.3)
Chloride: 109 mmol/L (ref 98–111)
Creatinine, Ser: 0.38 mg/dL — ABNORMAL LOW (ref 0.44–1.00)
GFR, Estimated: >60 mL/min (ref >60)
Glucose, Bld: 170 mg/dL — ABNORMAL HIGH (ref 70–99)
Potassium: 4.4 mmol/L (ref 3.5–5.1)
Sodium: 137 mmol/L (ref 135–145)
Total Bilirubin: 0.6 mg/dL (ref 0.3–1.2)
Total Protein: 6.4 g/dL — ABNORMAL LOW (ref 6.5–8.1)

## 2022-08-28 LAB — PHOSPHORUS: Phosphorus: 3.2 mg/dL (ref 2.5–4.6)

## 2022-08-28 LAB — MAGNESIUM: Magnesium: 2.2 mg/dL (ref 1.7–2.4)

## 2022-08-28 MED ORDER — ZINC CHLORIDE 1 MG/ML IV SOLN
INTRAVENOUS | Status: AC
  Filled 2022-08-28: qty 681.33

## 2022-08-28 NOTE — Progress Notes (Signed)
[facility] SURGICAL PROGRESS NOTE (cpt [phone])  Hospital Day(s): 196.   Post op day(s): 194 Days Post-Op.   Interval History:  Patient seen and examined Still with abdominal discomfort; waxing and waning; work up has been negative  No nausea/emesis [name] last changed 12/18 by bedside RN Nutritional labs pending this AM [name] output 550 ccs + unmeasured; she is starting to manage effluent independently She is on soft diet; on cyclic TPN  Review of Systems:  Constitutional: denies fever, chills  HEENT: denies cough or congestion  Respiratory: denies any shortness of breath  Cardiovascular: denies chest pain or palpitations  Gastrointestinal: + Abdominal pain, denies N/V Genitourinary: denies burning with urination or urinary frequency Integumentary: + midline wound (stable)   Vital signs in last 24 hours: [min-max] current  Temp:  [97.7 F (36.5 C)-98.7 F (37.1 C)] 97.7 F (36.5 C) (12/21 0322) Pulse Rate:  [70-85] 70 (12/21 0322) Resp:  [18-20] 18 (12/21 0322) BP: (106-115)/(57-64) 113/61 (12/21 0322) SpO2:  [97 %-99 %] 99 % (12/21 0322)     Height: '4\' 11"'$  (149.9 cm) Weight: 95.2 kg BMI (Calculated): 43.04   Intake/Output last 2 shifts:  12/20 0701 - 12/21 0700 In: 595.3 [I.V.:595.3] Out: 550 [Drains:550]   Physical Exam:  Constitutional: alert, cooperative and no distress  Respiratory: breathing non-labored at rest  Cardiovascular: regular rate and sinus rhythm  Gastrointestinal: Soft, again with RLQ tenderness and suprapubic tenderness, non-distended, no rebound/guarding. Integumentary: Midline wound open, peritoneum closed; granulating; there are three stomatized areas visible in the LUQ and LLQ portions of the wound, output remains feculent. There is hypergranulation tissue to the inferior wound edge.     Labs:     Latest Ref Rng & Units 08/11/2022    4:23 AM 08/01/2022    4:29 AM 07/13/2022    7:05 AM  CBC  WBC 4.0 - 10.5 K/uL 4.8  5.7  4.5    Hemoglobin 12.0 - 15.0 g/dL 7.8  8.4  9.9   Hematocrit 36.0 - 46.0 % 24.1  25.7  31.1   Platelets 150 - 400 K/uL 138  137  115       Latest Ref Rng & Units 08/25/2022    3:27 AM 08/21/2022    5:53 AM 08/18/2022    4:27 AM  CMP  Glucose 70 - 99 mg/dL 188  168  166   BUN 6 - 20 mg/dL '21  21  19   '$ Creatinine 0.44 - 1.00 mg/dL 0.45  0.30  0.34   Sodium 135 - 145 mmol/L 135  135  135   Potassium 3.5 - 5.1 mmol/L 4.0  4.4  4.1   Chloride 98 - 111 mmol/L 107  110  106   CO2 22 - 32 mmol/L '24  21  25   '$ Calcium 8.9 - 10.3 mg/dL 8.3  8.0  8.3   Total Protein 6.5 - 8.1 g/dL 5.9  6.2  6.7   Total Bilirubin 0.3 - 1.2 mg/dL 0.5  0.6  0.4   Alkaline Phos 38 - 126 U/L 128  149  183   AST 15 - 41 U/L 26  35  40   ALT 0 - 44 U/L 24  32  35     Imaging studies: No new pertinent imaging studies this AM   Assessment/Plan:  56 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   - New 12/21: No acute changes today  - Wound Care; Changed 12/18 by bedside RN; No longer on intermittent or continuous suction. Patient seems to like this better and has started to manage effluent independently. She will need intermittent suction with Yankauer to control effluent. Likely be beneficial to suction before ambulation.   - Continue TPN; cyclic; monitor nutritional labs bi-weekly - Appreciate dietary assistance with trace elements; iron supplementation            - Monitor abdominal examination; on-going bowel function            - Pain control prn; antemetic prn            - Progressed with therapies; no longer any recommendations  - Appreciate neurosurgery assistance with compression fracture; serial XRs   - Discharge Planning: Continue to  anticipate lengthy admission and potential eventual transfer; no new progress regarding disposition unfortunately   All of the above findings and recommendations were discussed with the patient, and the medical team, and all of patient's questions were answered to her expressed satisfaction.  -- [name], PA-C [facility] 08/28/2022, 7:19 AM M-F: 7am - 4pm

## 2022-08-28 NOTE — TOC Progression Note (Signed)
Transition of Care [facility]) - Progression Note    Patient Details  Name: Debra [name] MRN: [id number] Date of Birth: April 14, 1966  [facility]) CM/SW Contact  Beverly Sessions, RN Phone Number: 08/28/2022, 1:08 PM  Clinical Narrative:     Per Surgery PA. "No longer on intermittent or continuous suction. Patient seems to like this better and has started to manage effluent independently"  No update as far as discharge disposition   Planned Disposition: Home with Health Care Svc Barriers to Discharge: Continued Medical Work up  Expected Discharge Plan and Services                                               Social Determinants of Health (SDOH) Interventions SDOH Screenings   Food Insecurity: No Food Insecurity (05/20/2022)  Housing: Low Risk  (05/20/2022)  Transportation Needs: No Transportation Needs (05/20/2022)  Utilities: Not At Risk (05/20/2022)  Tobacco Use: Low Risk  (08/10/2022)    Readmission Risk Interventions     No data to display

## 2022-08-28 NOTE — Progress Notes (Signed)
PHARMACY - TOTAL PARENTERAL NUTRITION CONSULT NOTE   Indication: Prolonged ileus  Patient Measurements: Height: '4\' 11"'$  (149.9 cm) Weight: 95.2 kg (209 lb 14.1 oz) IBW/kg (Calculated) : 43.2 TPN AdjBW (KG): 55.2 Body mass index is 42.39 kg/m.  Assessment: Debra [name] is a 56 y.o. female s/p laparotomy, excision of greater omental mass, abdominal wall reconstruction with [name], appendectomy, and placement of Prevena vac.  Glucose / Insulin:  --No apparent history of diabetes --CBG's 80 - 154, all < 180 --rSSI 4x/day + 22u insulin in TPN --SSI: 7u last 24h Electrolytes: Within normal limits Renal: SCr stable at approximate baseline  Hepatic: LFTs, bilirubin wnl GI Imaging: 9/11 CTAP: no new acute issues GI Surgeries / Procedures: s/p laparotomy, excision of greater omental mass, abdominal wall reconstruction with [name], appendectomy, and placement of Prevena vac  Central access: 02/15/22 TPN start date: 02/15/22  Nutritional Goals: Goal cyclic TPN over 16 hrs: cyclic TPN over 16 hrs: (provides 102 g of protein and 1817 kcals per day) total volume over 16hrs for calculations=1400 ml (1500 ml total with overfill)  RD Assessment:  Estimated Needs Total Energy Estimated Needs: 1800-2100kcal/day Total Protein Estimated Needs: 90-110g/d Total Fluid Estimated Needs: 1.4-1.6L/day  Current Nutrition:  Soft diet + nutritional supplements, not meeting PO needs yet   Plan: Transitioned to *Cyclic* TPN on 2/29.   to run over 16 hours per MD request. [name] at 2000 per discussion with Dietician to allow patient time for walking in afternoon/evenings.  To run over 16 hours: -Start rate at 49 mL/hr for 1 hour. -Increase rate to 98 mL/hr for 13 hours.  -Decrease rate to 49 mL/hr for 1 hour. -Decrease rate to 25 mL/hr for 1 hours, then stop.  Plan:  See cyclic TPN rate above Continue Nutritional Components Amino acids (using 15% Clinisol): 102  g Dextrose 19% = 266 g Lipids (using 20% SMOFlipids): 50.4 g kCal: 1817/24h  Electrolytes in TPN: Na 47mq/L, K 471m/L, Ca 73m19mL, Mg 10 mEq/L, Phos 10 mmol/L, Cl:Ac 1:1 Continue MVI, trace elements, chromium and zinc per dietary recs. Continue CBG/SSI for Cyclic TPN:  -CBG 2 hrs after cyclic TPN start -CBG during middle of cyclic TPN infusion -CBG 1 hr after cylic TPN stopped -CBG while off TPN Continue resistant SSI + 22u insulin in TPN Check TPN labs on Mon/Thurs at minimum Monitor for further need of diuresis and adjust as needed for fluid status zinc 5 mg added to TPN 12/16 per dietician recommendation.  RodVallery SaharmD, BCPS Clinical Pharmacist 08/28/2022

## 2022-08-29 LAB — MISC LABCORP TEST (SEND OUT): Labcorp test code: 724195

## 2022-08-29 LAB — GLUCOSE, CAPILLARY
Glucose-Capillary: 179 mg/dL — ABNORMAL HIGH (ref 70–99)
Glucose-Capillary: 116 mg/dL — ABNORMAL HIGH (ref 70–99)
Glucose-Capillary: 91 mg/dL (ref 70–99)
Glucose-Capillary: 152 mg/dL — ABNORMAL HIGH (ref 70–99)

## 2022-08-29 MED ORDER — ZINC CHLORIDE 1 MG/ML IV SOLN
INTRAVENOUS | Status: AC
  Filled 2022-08-29: qty 681.33

## 2022-08-29 NOTE — Progress Notes (Signed)
PHARMACY - TOTAL PARENTERAL NUTRITION CONSULT NOTE   Indication: Prolonged ileus  Patient Measurements: Height: '4\' 11"'$  (149.9 cm) Weight: 95.5 kg (210 lb 8.6 oz) IBW/kg (Calculated) : 43.2 TPN AdjBW (KG): 55.2 Body mass index is 42.52 kg/m.  Assessment: Debra [name] is a 56 y.o. female s/p laparotomy, excision of greater omental mass, abdominal wall reconstruction with [name], appendectomy, and placement of Prevena vac.  Glucose / Insulin:  --No apparent history of diabetes --CBG's 97 - 179, all < 180 --rSSI 4x/day + 22u insulin in TPN --SSI: 11u last 24h Electrolytes: Within normal limits Renal: SCr stable at approximate baseline  Hepatic: LFTs, bilirubin wnl GI Imaging: 9/11 CTAP: no new acute issues GI Surgeries / Procedures: s/p laparotomy, excision of greater omental mass, abdominal wall reconstruction with [name], appendectomy, and placement of Prevena vac  Central access: 02/15/22 TPN start date: 02/15/22  Nutritional Goals: Goal cyclic TPN over 16 hrs: cyclic TPN over 16 hrs: (provides 102 g of protein and 1817 kcals per day) total volume over 16hrs for calculations=1400 ml (1500 ml total with overfill)  RD Assessment:  Estimated Needs Total Energy Estimated Needs: 1800-2100kcal/day Total Protein Estimated Needs: 90-110g/d Total Fluid Estimated Needs: 1.4-1.6L/day  Current Nutrition:  Soft diet + nutritional supplements, not meeting PO needs yet   Plan: Transitioned to *Cyclic* TPN on 8/25.   to run over 16 hours per MD request. [name] at 2000 per discussion with Dietician to allow patient time for walking in afternoon/evenings.  To run over 16 hours: -Start rate at 49 mL/hr for 1 hour. -Increase rate to 98 mL/hr for 13 hours.  -Decrease rate to 49 mL/hr for 1 hour. -Decrease rate to 25 mL/hr for 1 hours, then stop.  Plan:  See cyclic TPN rate above Continue Nutritional Components Amino acids (using 15% Clinisol): 102  g Dextrose 19% = 266 g Lipids (using 20% SMOFlipids): 50.4 g kCal: 1817/24h  Electrolytes in TPN: Na 48mq/L, K 459m/L, Ca 9m58mL, Mg 10 mEq/L, Phos 10 mmol/L, Cl:Ac 1:1 Continue MVI, trace elements, chromium and zinc per dietary recs. Continue CBG/SSI for Cyclic TPN:  -CBG 2 hrs after cyclic TPN start -CBG during middle of cyclic TPN infusion -CBG 1 hr after cylic TPN stopped -CBG while off TPN Continue resistant SSI + 22u insulin in TPN Check TPN labs on Mon/Thurs at minimum Monitor for further need of diuresis and adjust as needed for fluid status zinc 5 mg added to TPN 12/16 per dietician recommendation.  RodVallery SaharmD, BCPS Clinical Pharmacist 08/29/2022

## 2022-08-29 NOTE — Progress Notes (Signed)
[facility] SURGICAL PROGRESS NOTE (cpt [phone])  Hospital Day(s): 197.   Post op day(s): 195 Days Post-Op.   Interval History:  Patient seen and examined Still with abdominal discomfort, unchanged. Again, previous work up has been negative No nausea/emesis [name] last changed 12/18 by bedside RN No new labs this AM [name] output 1725 ccs + unmeasured; she is starting to manage effluent independently She is on soft diet; on cyclic TPN  Review of Systems:  Constitutional: denies fever, chills  HEENT: denies cough or congestion  Respiratory: denies any shortness of breath  Cardiovascular: denies chest pain or palpitations  Gastrointestinal: + Abdominal pain, denies N/V Genitourinary: denies burning with urination or urinary frequency Integumentary: + midline wound (stable)   Vital signs in last 24 hours: [min-max] current  Temp:  [97.8 F (36.6 C)-98.4 F (36.9 C)] 98.4 F (36.9 C) (12/22 0523) Pulse Rate:  [65-75] 65 (12/22 0523) Resp:  [14-18] 18 (12/22 0523) BP: (90-108)/(50-64) 95/57 (12/22 0523) SpO2:  [98 %-100 %] 98 % (12/22 0523) Weight:  [95.5 kg] 95.5 kg (12/22 0500)     Height: '4\' 11"'$  (149.9 cm) Weight: 95.5 kg BMI (Calculated): 43.04   Intake/Output last 2 shifts:  12/21 0701 - 12/22 0700 In: 1104.2 [P.O.:240; I.V.:864.2] Out: 1725 [Drains:1725]   Physical Exam:  Constitutional: alert, cooperative and no distress  Respiratory: breathing non-labored at rest  Cardiovascular: regular rate and sinus rhythm  Gastrointestinal: Soft, again with RLQ tenderness and suprapubic tenderness, non-distended, no rebound/guarding. Integumentary: Midline wound open, peritoneum closed; granulating; there are three stomatized areas visible in the LUQ and LLQ portions of the wound, output remains feculent. There is hypergranulation tissue to the inferior wound edge.     Labs:     Latest Ref Rng & Units 08/11/2022    4:23 AM 08/01/2022    4:29 AM 07/13/2022     7:05 AM  CBC  WBC 4.0 - 10.5 K/uL 4.8  5.7  4.5   Hemoglobin 12.0 - 15.0 g/dL 7.8  8.4  9.9   Hematocrit 36.0 - 46.0 % 24.1  25.7  31.1   Platelets 150 - 400 K/uL 138  137  115       Latest Ref Rng & Units 08/28/2022    9:13 AM 08/25/2022    3:27 AM 08/21/2022    5:53 AM  CMP  Glucose 70 - 99 mg/dL 170  188  168   BUN 6 - 20 mg/dL '21  21  21   '$ Creatinine 0.44 - 1.00 mg/dL 0.38  0.45  0.30   Sodium 135 - 145 mmol/L 137  135  135   Potassium 3.5 - 5.1 mmol/L 4.4  4.0  4.4   Chloride 98 - 111 mmol/L 109  107  110   CO2 22 - 32 mmol/L '23  24  21   '$ Calcium 8.9 - 10.3 mg/dL 8.4  8.3  8.0   Total Protein 6.5 - 8.1 g/dL 6.4  5.9  6.2   Total Bilirubin 0.3 - 1.2 mg/dL 0.6  0.5  0.6   Alkaline Phos 38 - 126 U/L 133  128  149   AST 15 - 41 U/L 32  26  35   ALT 0 - 44 U/L 23  24  32     Imaging studies: No new pertinent imaging studies this AM   Assessment/Plan:  56 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   - New 12/22: No acute changes today  - Wound Care; Changed 12/18 by bedside RN; No longer on intermittent or continuous suction. Patient seems to like this better and has started to manage effluent independently. She will need intermittent suction with Yankauer to control effluent. Likely be beneficial to suction before ambulation.   - Continue TPN; cyclic; monitor nutritional labs bi-weekly - Appreciate dietary assistance with trace elements; iron supplementation            - Monitor abdominal examination; on-going bowel function            - Pain control prn; antemetic prn            - Progressed with therapies; no longer any recommendations  - Appreciate neurosurgery assistance with compression fracture; serial  XRs   - Discharge Planning: Continue to anticipate lengthy admission and potential eventual transfer; no new progress regarding disposition unfortunately   All of the above findings and recommendations were discussed with the patient, and the medical team, and all of patient's questions were answered to her expressed satisfaction.  -- [name], PA-C [facility] 08/29/2022, 7:29 AM M-F: 7am - 4pm

## 2022-08-29 NOTE — Plan of Care (Signed)
  Problem: Clinical Measurements: Goal: Will remain free from infection Outcome: Progressing   Problem: Nutrition: Goal: Adequate nutrition will be maintained Outcome: Progressing   Problem: Activity: Goal: Risk for activity intolerance will decrease Outcome: Progressing   Problem: Nutrition: Goal: Adequate nutrition will be maintained Outcome: Progressing   Problem: Coping: Goal: Level of anxiety will decrease Outcome: Progressing   Problem: Pain Managment: Goal: General experience of comfort will improve Outcome: Progressing

## 2022-08-29 NOTE — Progress Notes (Signed)
Mobility Specialist - Progress Note   08/29/22 1049  Mobility  Activity Ambulated with assistance to bathroom  Level of Assistance Modified independent, requires aide device or extra time  Assistive Device Front wheel walker  Distance Ambulated (ft) 8 ft  RUE Weight Bearing WBAT  Activity Response Tolerated well  $Mobility charge 1 Mobility   Pt supine upon entry, utilizing RA. Pt completed bed mob, MinA of LLE to transfer EOB. Pt STS to RW and ambulation to the bathroom ModI. Pt left sitting in bathroom with call bell within reach, no complaints.   Candie Mile Mobility Specialist 08/29/22 11:00 AM

## 2022-08-30 MED ORDER — ZINC CHLORIDE 1 MG/ML IV SOLN
INTRAVENOUS | Status: AC
  Filled 2022-08-30: qty 681.33

## 2022-08-30 NOTE — Progress Notes (Signed)
Patient ID: Debra [name], female   DOB: 09/08/1966, 56 y.o.   MRN: [id number]     [place] Hospital Day(s): 198.   Interval History: Patient seen and examined, no acute events or new complaints overnight. Patient reports feeling better.  She endorses that the pain has improved.  She was able to get out of bed and walk to the bathroom.  Denies any nausea or vomiting.  Vital signs in last 24 hours: [min-max] current  Temp:  [97.9 F (36.6 C)-98.1 F (36.7 C)] 97.9 F (36.6 C) (12/23 0525) Pulse Rate:  [70-83] 83 (12/23 0525) Resp:  [16-20] 20 (12/23 0525) BP: (93-106)/(49-67) 93/55 (12/23 0525) SpO2:  [99 %-100 %] 99 % (12/23 0525) Weight:  [93.9 kg] 93.9 kg (12/23 0525)     Height: '4\' 11"'$  (149.9 cm) Weight: 93.9 kg BMI (Calculated): 43.04   Physical Exam:  Constitutional: alert, cooperative and no distress  Respiratory: breathing non-labored at rest  Cardiovascular: regular rate and sinus rhythm  Gastrointestinal: soft, non-tender, and non-distended.  [name] pouch in place.  Labs:     Latest Ref Rng & Units 08/11/2022    4:23 AM 08/01/2022    4:29 AM 07/13/2022    7:05 AM  CBC  WBC 4.0 - 10.5 K/uL 4.8  5.7  4.5   Hemoglobin 12.0 - 15.0 g/dL 7.8  8.4  9.9   Hematocrit 36.0 - 46.0 % 24.1  25.7  31.1   Platelets 150 - 400 K/uL 138  137  115       Latest Ref Rng & Units 08/28/2022    9:13 AM 08/25/2022    3:27 AM 08/21/2022    5:53 AM  CMP  Glucose 70 - 99 mg/dL 170  188  168   BUN 6 - 20 mg/dL '21  21  21   '$ Creatinine 0.44 - 1.00 mg/dL 0.38  0.45  0.30   Sodium 135 - 145 mmol/L 137  135  135   Potassium 3.5 - 5.1 mmol/L 4.4  4.0  4.4   Chloride 98 - 111 mmol/L 109  107  110   CO2 22 - 32 mmol/L '23  24  21   '$ Calcium 8.9 - 10.3 mg/dL 8.4  8.3  8.0   Total Protein 6.5 - 8.1 g/dL 6.4  5.9  6.2   Total Bilirubin 0.3 - 1.2 mg/dL 0.6  0.5  0.6   Alkaline Phos 38 - 126 U/L 133  128  149   AST 15 - 41 U/L 32  26  35   ALT 0 - 44 U/L 23  24  32     Imaging  studies: No new pertinent imaging studies   Assessment/Plan:  56 y.o. female with metastatic colon cancer s/p omentectomy and incisional hernia repair 196 Days Post-Op, complicated by pertinent comorbidities including enterocutaneous fistula.     -Stable vital signs, no clinical alteration -Pain continues to be controlled -Patient started to get more active again -Looks comfortable -Will continue with current treatment with TPN, current diet, local care. -Continue pain management -Encouraged the patient to ambulate  [name], MD

## 2022-08-30 NOTE — Plan of Care (Signed)
  Problem: Clinical Measurements: Goal: Will remain free from infection Outcome: Progressing   Problem: Nutrition: Goal: Adequate nutrition will be maintained Outcome: Progressing   Problem: Activity: Goal: Risk for activity intolerance will decrease Outcome: Progressing   Problem: Nutrition: Goal: Adequate nutrition will be maintained Outcome: Progressing   Problem: Coping: Goal: Level of anxiety will decrease Outcome: Progressing   Problem: Pain Managment: Goal: General experience of comfort will improve Outcome: Progressing

## 2022-08-30 NOTE — Progress Notes (Signed)
PHARMACY - TOTAL PARENTERAL NUTRITION CONSULT NOTE   Indication: Prolonged ileus  Patient Measurements: Height: '4\' 11"'$  (149.9 cm) Weight: 93.9 kg (207 lb) IBW/kg (Calculated) : 43.2 TPN AdjBW (KG): 55.2 Body mass index is 41.81 kg/m.  Assessment: Debra [name] is a 56 y.o. female s/p laparotomy, excision of greater omental mass, abdominal wall reconstruction with Debra [name] release, appendectomy, and placement of Prevena vac.  Glucose / Insulin:  --No apparent history of diabetes --CBG's 97 - 179, all < 180 --rSSI 4x/day + 22u insulin in TPN --SSI: 7u last 24h Electrolytes: Within normal limits Renal: SCr stable at approximate baseline  Hepatic: LFTs, bilirubin wnl GI Imaging: 9/11 CTAP: no new acute issues GI Surgeries / Procedures: s/p laparotomy, excision of greater omental mass, abdominal wall reconstruction with Debra [name] release, appendectomy, and placement of Prevena vac  Central access: 02/15/22 TPN start date: 02/15/22  Nutritional Goals: Goal cyclic TPN over 16 hrs: cyclic TPN over 16 hrs: (provides 102 g of protein and 1817 kcals per day) total volume over 16hrs for calculations=1400 ml (1500 ml total with overfill)  RD Assessment:  Estimated Needs Total Energy Estimated Needs: 1800-2100kcal/day Total Protein Estimated Needs: 90-110g/d Total Fluid Estimated Needs: 1.4-1.6L/day  Current Nutrition:  Soft diet + nutritional supplements, not meeting PO needs yet   Plan: Transitioned to *Cyclic* TPN on 8/12.   to run over 16 hours per MD request. Debra [name] at 2000 per discussion with Dietician to allow patient time for walking in afternoon/evenings.  To run over 16 hours: -Start rate at 49 mL/hr for 1 hour. -Increase rate to 98 mL/hr for 13 hours.  -Decrease rate to 49 mL/hr for 1 hour. -Decrease rate to 25 mL/hr for 1 hours, then stop.  Plan:  See cyclic TPN rate above Continue Nutritional Components Amino acids (using 15% Clinisol): 102 g Dextrose  19% = 266 g Lipids (using 20% SMOFlipids): 50.4 g kCal: 1817/24h  Electrolytes in TPN: Na 84mq/L, K 474m/L, Ca 66m68mL, Mg 10 mEq/L, Phos 10 mmol/L, Cl:Ac 1:1 Continue MVI, trace elements, chromium and zinc per dietary recs. Continue CBG/SSI for Cyclic TPN:  -CBG 2 hrs after cyclic TPN start -CBG during middle of cyclic TPN infusion -CBG 1 hr after cylic TPN stopped -CBG while off TPN Continue resistant SSI + 22u insulin in TPN Check TPN labs on Mon/Thurs at minimum Monitor for further need of diuresis and adjust as needed for fluid status zinc 5 mg added to TPN 12/16 per dietician recommendation.  WalPearla [name] Clinical Pharmacist 08/30/2022 11:23 AM

## 2022-08-31 LAB — GLUCOSE, CAPILLARY: Glucose-Capillary: 92 mg/dL (ref 70–99)

## 2022-08-31 MED ORDER — ZINC CHLORIDE 1 MG/ML IV SOLN
INTRAVENOUS | Status: AC
  Filled 2022-08-31: qty 681.33

## 2022-08-31 NOTE — Progress Notes (Signed)
[facility] SURGICAL PROGRESS NOTE (cpt [phone])  Hospital Day(s): 199.   Post op day(s): 197 Days Post-Op.   Interval History:  Patient seen and examined No new complaints.  No nausea/emesis [name] last changed by bedside RN No new labs this AM [name] output 1725 ccs + unmeasured; she is starting to manage effluent independently She is on soft diet; on cyclic TPN  Review of Systems:  Constitutional: denies fever, chills  HEENT: denies cough or congestion  Respiratory: denies any shortness of breath  Cardiovascular: denies chest pain or palpitations  Gastrointestinal: + Abdominal pain, denies N/V Genitourinary: denies burning with urination or urinary frequency Integumentary: + midline wound (stable)   Vital signs in last 24 hours: [min-max] current  Temp:  [97.7 F (36.5 C)-98 F (36.7 C)] 97.7 F (36.5 C) (12/24 0439) Pulse Rate:  [70-81] 73 (12/24 0439) Resp:  [16-20] 20 (12/24 0439) BP: (105-112)/(58-85) 108/62 (12/24 0439) SpO2:  [94 %-99 %] 99 % (12/24 0439) Weight:  [94 kg] 94 kg (12/24 0439)     Height: '4\' 11"'$  (149.9 cm) Weight: 94 kg BMI (Calculated): 43.04   Intake/Output last 2 shifts:  12/23 0701 - 12/24 0700 In: 293.5 [I.V.:293.5] Out: 1200 [Drains:1200]   Physical Exam:  Constitutional: alert, cooperative and no distress  Respiratory: breathing non-labored at rest  Cardiovascular: regular rate and sinus rhythm  Gastrointestinal: Soft, again with RLQ tenderness and suprapubic tenderness, non-distended, no rebound/guarding. Integumentary: Midline wound open, peritoneum closed; granulating; there are three stomatized areas visible in the LUQ and LLQ portions of the wound, output remains feculent. There is hypergranulation tissue to the inferior wound edge.     Labs:     Latest Ref Rng & Units 08/11/2022    4:23 AM 08/01/2022    4:29 AM 07/13/2022    7:05 AM  CBC  WBC 4.0 - 10.5 K/uL 4.8  5.7  4.5   Hemoglobin 12.0 - 15.0 g/dL 7.8  8.4   9.9   Hematocrit 36.0 - 46.0 % 24.1  25.7  31.1   Platelets 150 - 400 K/uL 138  137  115       Latest Ref Rng & Units 08/28/2022    9:13 AM 08/25/2022    3:27 AM 08/21/2022    5:53 AM  CMP  Glucose 70 - 99 mg/dL 170  188  168   BUN 6 - 20 mg/dL '21  21  21   '$ Creatinine 0.44 - 1.00 mg/dL 0.38  0.45  0.30   Sodium 135 - 145 mmol/L 137  135  135   Potassium 3.5 - 5.1 mmol/L 4.4  4.0  4.4   Chloride 98 - 111 mmol/L 109  107  110   CO2 22 - 32 mmol/L '23  24  21   '$ Calcium 8.9 - 10.3 mg/dL 8.4  8.3  8.0   Total Protein 6.5 - 8.1 g/dL 6.4  5.9  6.2   Total Bilirubin 0.3 - 1.2 mg/dL 0.6  0.5  0.6   Alkaline Phos 38 - 126 U/L 133  128  149   AST 15 - 41 U/L 32  26  35   ALT 0 - 44 U/L 23  24  32     Imaging studies: No new pertinent imaging studies this AM   Assessment/Plan:  56 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   - New 12/24: No acute changes today  - Wound Care; Changed 12/18 by bedside RN; No longer on intermittent or continuous suction. Patient seems to like this better and has started to manage effluent independently. She will need intermittent suction with Yankauer to control effluent. Likely be beneficial to suction before ambulation.   - Continue TPN; cyclic; monitor nutritional labs bi-weekly - Appreciate dietary assistance with trace elements; iron supplementation            - Monitor abdominal examination; on-going bowel function            - Pain control prn; antemetic prn            - Progressed with therapies; no longer any recommendations  - Appreciate neurosurgery assistance with compression fracture; serial XRs   - Discharge Planning: Continue to anticipate lengthy admission and potential  eventual transfer; no new progress regarding disposition unfortunately   All of the above findings and recommendations were discussed with the patient, and the medical team, and all of patient's questions were answered to her expressed satisfaction.  Ronny Bacon, M.D., [facility]  08/31/2022 ; 11:11 AM

## 2022-08-31 NOTE — Progress Notes (Signed)
PHARMACY - TOTAL PARENTERAL NUTRITION CONSULT NOTE   Indication: Prolonged ileus  Patient Measurements: Height: '4\' 11"'$  (149.9 cm) Weight: 94 kg (207 lb 3.7 oz) IBW/kg (Calculated) : 43.2 TPN AdjBW (KG): 55.2 Body mass index is 41.86 kg/m.  Assessment: Debra [name] is a 56 y.o. female s/p laparotomy, excision of greater omental mass, abdominal wall reconstruction with Debra [name] release, appendectomy, and placement of Prevena vac.  Glucose / Insulin:  --No apparent history of diabetes --CBG's 97 - 179, all < 180 --rSSI 4x/day + 22u insulin in TPN --SSI: 7u last 24h Electrolytes: Within normal limits Renal: SCr stable at approximate baseline  Hepatic: LFTs, bilirubin wnl GI Imaging: 9/11 CTAP: no new acute issues GI Surgeries / Procedures: s/p laparotomy, excision of greater omental mass, abdominal wall reconstruction with Debra [name] release, appendectomy, and placement of Prevena vac  Central access: 02/15/22 TPN start date: 02/15/22  Nutritional Goals: Goal cyclic TPN over 16 hrs: cyclic TPN over 16 hrs: (provides 102 g of protein and 1817 kcals per day) total volume over 16hrs for calculations=1400 ml (1500 ml total with overfill)  RD Assessment:  Estimated Needs Total Energy Estimated Needs: 1800-2100kcal/day Total Protein Estimated Needs: 90-110g/d Total Fluid Estimated Needs: 1.4-1.6L/day  Current Nutrition:  Soft diet + nutritional supplements, not meeting PO needs yet   Plan: Transitioned to *Cyclic* TPN on 9/52.   to run over 16 hours per MD request. [name] at 2000 per discussion with Dietician to allow patient time for walking in afternoon/evenings.  To run over 16 hours: -Start rate at 49 mL/hr for 1 hour. -Increase rate to 98 mL/hr for 13 hours.  -Decrease rate to 49 mL/hr for 1 hour. -Decrease rate to 25 mL/hr for 1 hours, then stop.  Plan:  See cyclic TPN rate above Continue Nutritional Components Amino acids (using 15% Clinisol): 102  g Dextrose 19% = 266 g Lipids (using 20% SMOFlipids): 50.4 g kCal: 1817/24h  Electrolytes in TPN: Na 83mq/L, K 426m/L, Ca 52m78mL, Mg 10 mEq/L, Phos 10 mmol/L, Cl:Ac 1:1 Continue MVI, trace elements, chromium and zinc per dietary recs. Continue CBG/SSI for Cyclic TPN:  -CBG 2 hrs after cyclic TPN start -CBG during middle of cyclic TPN infusion -CBG 1 hr after cylic TPN stopped -CBG while off TPN Continue resistant SSI + 22u insulin in TPN Check TPN labs on Mon/Thurs at minimum Monitor for further need of diuresis and adjust as needed for fluid status zinc 5 mg added to TPN 12/16 per dietician recommendation.  [name] Clinical Pharmacist 08/31/2022 10:58 AM

## 2022-09-01 LAB — COMPREHENSIVE METABOLIC PANEL
ALT: 33 U/L (ref 0–44)
AST: 32 U/L (ref 15–41)
Albumin: 2.7 g/dL — ABNORMAL LOW (ref 3.5–5.0)
Alkaline Phosphatase: 171 U/L — ABNORMAL HIGH (ref 38–126)
Anion gap: 5 (ref 5–15)
BUN: 17 mg/dL (ref 6–20)
CO2: 21 mmol/L — ABNORMAL LOW (ref 22–32)
Calcium: 8.2 mg/dL — ABNORMAL LOW (ref 8.9–10.3)
Chloride: 107 mmol/L (ref 98–111)
Creatinine, Ser: 0.5 mg/dL (ref 0.44–1.00)
GFR, Estimated: >60 mL/min (ref >60)
Glucose, Bld: 159 mg/dL — ABNORMAL HIGH (ref 70–99)
Potassium: 4.1 mmol/L (ref 3.5–5.1)
Sodium: 133 mmol/L — ABNORMAL LOW (ref 135–145)
Total Bilirubin: 0.5 mg/dL (ref 0.3–1.2)
Total Protein: 7 g/dL (ref 6.5–8.1)

## 2022-09-01 LAB — MAGNESIUM: Magnesium: 1.9 mg/dL (ref 1.7–2.4)

## 2022-09-01 LAB — GLUCOSE, CAPILLARY: Glucose-Capillary: 170 mg/dL — ABNORMAL HIGH (ref 70–99)

## 2022-09-01 LAB — PHOSPHORUS: Phosphorus: 3.1 mg/dL (ref 2.5–4.6)

## 2022-09-01 MED ORDER — ZINC CHLORIDE 1 MG/ML IV SOLN
INTRAVENOUS | Status: AC
  Filled 2022-09-01: qty 681.33

## 2022-09-01 NOTE — Progress Notes (Signed)
PHARMACY - TOTAL PARENTERAL NUTRITION CONSULT NOTE   Indication: Prolonged ileus  Patient Measurements: Height: '4\' 11"'$  (149.9 cm) Weight: 94.5 kg (208 lb 5.4 oz) IBW/kg (Calculated) : 43.2 TPN AdjBW (KG): 55.2 Body mass index is 42.08 kg/m.  Assessment: Debra [name] is a 56 y.o. female s/p laparotomy, excision of greater omental mass, abdominal wall reconstruction with [name], appendectomy, and placement of Prevena vac.  Glucose / Insulin:  --No apparent history of diabetes --CBG's 92 - 152, all < 180 --rSSI 4x/day + 22u insulin in TPN --SSI: 4u last 24h Electrolytes: Within normal limits Renal: SCr stable at approximate baseline  Hepatic: LFTs, bilirubin wnl GI Imaging: 9/11 CTAP: no new acute issues GI Surgeries / Procedures: s/p laparotomy, excision of greater omental mass, abdominal wall reconstruction with [name], appendectomy, and placement of Prevena vac  Central access: 02/15/22 TPN start date: 02/15/22  Nutritional Goals: Goal cyclic TPN over 16 hrs: cyclic TPN over 16 hrs: (provides 102 g of protein and 1817 kcals per day) total volume over 16hrs for calculations=1400 ml (1500 ml total with overfill)  RD Assessment:  Estimated Needs Total Energy Estimated Needs: 1800-2100kcal/day Total Protein Estimated Needs: 90-110g/d Total Fluid Estimated Needs: 1.4-1.6L/day  Current Nutrition:  Soft diet + nutritional supplements, not meeting PO needs yet   Plan: Transitioned to *Cyclic* TPN on 0/93.   to run over 16 hours per MD request. [name] at 2000 per discussion with Dietician to allow patient time for walking in afternoon/evenings.  To run over 16 hours: -Start rate at 49 mL/hr for 1 hour. -Increase rate to 98 mL/hr for 13 hours.  -Decrease rate to 49 mL/hr for 1 hour. -Decrease rate to 25 mL/hr for 1 hours, then stop.  Plan:  See cyclic TPN rate above Continue Nutritional Components Amino acids (using 15% Clinisol): 102  g Dextrose 19% = 266 g Lipids (using 20% SMOFlipids): 50.4 g kCal: 1817/24h  Electrolytes in TPN: Na 54mq/L, K 422m/L, Ca 34m54mL, Mg 10 mEq/L, Phos 10 mmol/L, Cl:Ac 1:1 Continue MVI, trace elements, chromium and zinc per dietary recs. Continue CBG/SSI for Cyclic TPN:  -CBG 2 hrs after cyclic TPN start -CBG during middle of cyclic TPN infusion -CBG 1 hr after cylic TPN stopped -CBG while off TPN Continue resistant SSI + 20u insulin in TPN(decreased from 22u on 12/25) Check TPN labs on Mon/Thurs at minimum Monitor for further need of diuresis and adjust as needed for fluid status zinc 5 mg added to TPN 12/16 per dietician recommendation.  [name] Clinical Pharmacist 09/01/2022 10:48 AM

## 2022-09-01 NOTE — Progress Notes (Signed)
[facility] SURGICAL PROGRESS NOTE (cpt [phone])  Hospital Day(s): 200.   Post op day(s): 198 Days Post-Op.   Interval History:  Patient seen and examined No new complaints.  No nausea/emesis [name] last changed by bedside RN No new labs this AM [name] output 1400 ccs + unmeasured; she is starting to manage effluent independently She is on soft diet; on cyclic TPN   Vital signs in last 24 hours: [min-max] current  Temp:  [97.9 F (36.6 C)-98.2 F (36.8 C)] 97.9 F (36.6 C) (12/25 0512) Pulse Rate:  [75-78] 78 (12/25 0512) Resp:  [16-19] 16 (12/25 0512) BP: (97-117)/(48-70) 100/54 (12/25 0512) SpO2:  [100 %] 100 % (12/25 0512) Weight:  [94.5 kg] 94.5 kg (12/25 0500)     Height: '4\' 11"'$  (149.9 cm) Weight: 94.5 kg BMI (Calculated): 43.04   Intake/Output last 2 shifts:  12/24 0701 - 12/25 0700 In: 288.4 [I.V.:288.4] Out: 1400 [Stool:1400]   Physical Exam:  Constitutional: alert, cooperative and no distress  Respiratory: breathing non-labored at rest  Cardiovascular: regular rate and sinus rhythm  Gastrointestinal: Soft, again with RLQ tenderness and suprapubic tenderness, non-distended, no rebound/guarding. Integumentary: Midline wound open, peritoneum closed; granulating; there are three stomatized areas visible in the LUQ and LLQ portions of the wound, output remains feculent. There is hypergranulation tissue to the inferior wound edge.     Labs:     Latest Ref Rng & Units 08/11/2022    4:23 AM 08/01/2022    4:29 AM 07/13/2022    7:05 AM  CBC  WBC 4.0 - 10.5 K/uL 4.8  5.7  4.5   Hemoglobin 12.0 - 15.0 g/dL 7.8  8.4  9.9   Hematocrit 36.0 - 46.0 % 24.1  25.7  31.1   Platelets 150 - 400 K/uL 138  137  115       Latest Ref Rng & Units 08/28/2022    9:13 AM 08/25/2022    3:27 AM 08/21/2022    5:53 AM  CMP  Glucose 70 - 99 mg/dL 170  188  168   BUN 6 - 20 mg/dL '21  21  21   '$ Creatinine 0.44 - 1.00 mg/dL 0.38  0.45  0.30   Sodium 135 - 145 mmol/L 137   135  135   Potassium 3.5 - 5.1 mmol/L 4.4  4.0  4.4   Chloride 98 - 111 mmol/L 109  107  110   CO2 22 - 32 mmol/L '23  24  21   '$ Calcium 8.9 - 10.3 mg/dL 8.4  8.3  8.0   Total Protein 6.5 - 8.1 g/dL 6.4  5.9  6.2   Total Bilirubin 0.3 - 1.2 mg/dL 0.6  0.5  0.6   Alkaline Phos 38 - 126 U/L 133  128  149   AST 15 - 41 U/L 32  26  35   ALT 0 - 44 U/L 23  24  32     Imaging studies: No new pertinent imaging studies this AM   Assessment/Plan:  56 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   - New 12/25: No acute changes today  - Wound Care; Changed 12/18 by bedside RN; No longer on intermittent or continuous suction. Patient seems to like this better and has started to manage effluent  independently. She will need intermittent suction with Yankauer to control effluent. Likely be beneficial to suction before ambulation.   - Continue TPN; cyclic; monitor nutritional labs bi-weekly - Appreciate dietary assistance with trace elements; iron supplementation            - Monitor abdominal examination; on-going bowel function            - Pain control prn; antemetic prn            - Progressed with therapies; no longer any recommendations  - Appreciate neurosurgery assistance with compression fracture; serial XRs   - Discharge Planning: Continue to anticipate lengthy admission and potential eventual transfer; no new progress regarding disposition unfortunately   All of the above findings and recommendations were discussed with the patient, and the medical team, and all of patient's questions were answered to her expressed satisfaction.  Ronny Bacon, M.D., [facility]  09/01/2022 ; 10:31 AM

## 2022-09-02 LAB — BASIC METABOLIC PANEL
Anion gap: 7 (ref 5–15)
BUN: 21 mg/dL — ABNORMAL HIGH (ref 6–20)
CO2: 19 mmol/L — ABNORMAL LOW (ref 22–32)
Calcium: 8.4 mg/dL — ABNORMAL LOW (ref 8.9–10.3)
Chloride: 108 mmol/L (ref 98–111)
Creatinine, Ser: 0.47 mg/dL (ref 0.44–1.00)
GFR, Estimated: >60 mL/min (ref >60)
Glucose, Bld: 186 mg/dL — ABNORMAL HIGH (ref 70–99)
Potassium: 4.2 mmol/L (ref 3.5–5.1)
Sodium: 134 mmol/L — ABNORMAL LOW (ref 135–145)

## 2022-09-02 LAB — GLUCOSE, CAPILLARY
Glucose-Capillary: 134 mg/dL — ABNORMAL HIGH (ref 70–99)
Glucose-Capillary: 95 mg/dL (ref 70–99)
Glucose-Capillary: 94 mg/dL (ref 70–99)
Glucose-Capillary: 82 mg/dL (ref 70–99)
Glucose-Capillary: 134 mg/dL — ABNORMAL HIGH (ref 70–99)
Glucose-Capillary: 160 mg/dL — ABNORMAL HIGH (ref 70–99)
Glucose-Capillary: 111 mg/dL — ABNORMAL HIGH (ref 70–99)
Glucose-Capillary: 114 mg/dL — ABNORMAL HIGH (ref 70–99)
Glucose-Capillary: 90 mg/dL (ref 70–99)
Glucose-Capillary: 182 mg/dL — ABNORMAL HIGH (ref 70–99)
Glucose-Capillary: 160 mg/dL — ABNORMAL HIGH (ref 70–99)
Glucose-Capillary: 199 mg/dL — ABNORMAL HIGH (ref 70–99)
Glucose-Capillary: 94 mg/dL (ref 70–99)
Glucose-Capillary: 106 mg/dL — ABNORMAL HIGH (ref 70–99)
Glucose-Capillary: 134 mg/dL — ABNORMAL HIGH (ref 70–99)

## 2022-09-02 LAB — MAGNESIUM: Magnesium: 2.1 mg/dL (ref 1.7–2.4)

## 2022-09-02 LAB — PHOSPHORUS: Phosphorus: 3.4 mg/dL (ref 2.5–4.6)

## 2022-09-02 MED ORDER — ZINC CHLORIDE 1 MG/ML IV SOLN
INTRAVENOUS | Status: AC
  Filled 2022-09-02: qty 681.33

## 2022-09-02 NOTE — Progress Notes (Signed)
CC: EC fistula Subjective: Suprapubic pain improving [name] intact  Objective: Vital signs in last 24 hours: Temp:  [97.9 F (36.6 C)-98.9 F (37.2 C)] 97.9 F (36.6 C) (12/26 0805) Pulse Rate:  [72-87] 74 (12/26 0805) Resp:  [14-17] 14 (12/26 0805) BP: (99-143)/(56-72) 105/56 (12/26 0805) SpO2:  [100 %] 100 % (12/26 0805) Last BM Date : 09/01/22  Intake/Output from previous day: 12/25 0701 - 12/26 0700 In: 847 [P.O.:180; I.V.:667] Out: 500 [Stool:500] Intake/Output this shift: Total I/O In: -  Out: 200 [Drains:200]  Physical exam:  NAd alert Abd: soft, eakin pouch intact w brown effluent. No leaks  Lab Results: CBC  No results for input(s): "WBC", "HGB", "HCT", "PLT" in the last 72 hours. BMET Recent Labs    09/01/22 1731 09/02/22 0823  NA 133* 134*  K 4.1 4.2  CL 107 108  CO2 21* 19*  GLUCOSE 159* 186*  BUN 17 21*  CREATININE 0.50 0.47  CALCIUM 8.2* 8.4*   PT/INR No results for input(s): "LABPROT", "INR" in the last 72 hours. ABG No results for input(s): "PHART", "HCO3" in the last 72 hours.  Invalid input(s): "PCO2", "PO2"  Studies/Results: No results found.  Anti-infectives: Anti-infectives (From admission, onward)    Start     Dose/Rate Route Frequency Ordered Stop   03/04/22 2200  piperacillin-tazobactam (ZOSYN) IVPB 3.375 g        3.375 g 12.5 mL/hr over 240 Minutes Intravenous Every 8 hours 03/04/22 2005 03/18/22 0248   02/21/22 1500  vancomycin (VANCOREADY) IVPB 1250 mg/250 mL  Status:  Discontinued        1,250 mg 166.7 mL/hr over 90 Minutes Intravenous Every 24 hours 02/21/22 1111 02/24/22 1336   02/21/22 1400  meropenem (MERREM) 1 g in sodium chloride 0.9 % 100 mL IVPB        1 g 200 mL/hr over 30 Minutes Intravenous Every 8 hours 02/21/22 1258 02/27/22 2157   02/20/22 1500  vancomycin (VANCOCIN) IVPB 1000 mg/200 mL premix  Status:  Discontinued        1,000 mg 200 mL/hr over 60 Minutes Intravenous Every 24 hours 02/19/22 1706  02/21/22 1111   02/19/22 1115  vancomycin (VANCOREADY) IVPB 2000 mg/400 mL        2,000 mg 200 mL/hr over 120 Minutes Intravenous  Once 02/19/22 1025 02/19/22 1732   02/19/22 1115  fluconazole (DIFLUCAN) IVPB 400 mg        400 mg 100 mL/hr over 120 Minutes Intravenous Every 24 hours 02/19/22 1025 03/04/22 1301   02/15/22 0730  piperacillin-tazobactam (ZOSYN) IVPB 3.375 g  Status:  Discontinued        3.375 g 12.5 mL/hr over 240 Minutes Intravenous Every 8 hours 02/15/22 0639 02/21/22 1235   02/15/22 0655  piperacillin-tazobactam (ZOSYN) 3.375 GM/50ML IVPB       Note to Pharmacy: Glenford Peers N: cabinet override      02/15/22 0655 02/15/22 0816   02/13/22 2200  cefoTEtan (CEFOTAN) 2 g in sodium chloride 0.9 % 100 mL IVPB  Status:  Discontinued        2 g 200 mL/hr over 30 Minutes Intravenous Every 12 hours 02/13/22 1541 02/15/22 0639   02/13/22 1345  cefoTEtan (CEFOTAN) 2 g in sodium chloride 0.9 % 100 mL IVPB        2 g 200 mL/hr over 30 Minutes Intravenous  Once 02/13/22 1331 02/13/22 2146   02/13/22 1336  sodium chloride 0.9 % with cefoTEtan (CEFOTAN) ADS Med  Note to Pharmacy: Rulon Sera: cabinet override      02/13/22 1336 02/14/22 0144   02/13/22 0620  sodium chloride 0.9 % with cefoTEtan (CEFOTAN) ADS Med       Note to Pharmacy: Norton Blizzard A: cabinet override      02/13/22 0620 02/13/22 0802   02/13/22 0600  cefoTEtan (CEFOTAN) 2 g in sodium chloride 0.9 % 100 mL IVPB        2 g 200 mL/hr over 30 Minutes Intravenous On call to O.R. 02/12/22 2229 02/13/22 2146       Assessment/Plan:  Ec fistula continue supportive care w TPN and wound care  Caroleen Hamman, MD, FACS  09/02/2022

## 2022-09-02 NOTE — Progress Notes (Signed)
PHARMACY - TOTAL PARENTERAL NUTRITION CONSULT NOTE   Indication: Prolonged ileus  Patient Measurements: Height: '4\' 11"'$  (149.9 cm) Weight: 94.5 kg (208 lb 5.4 oz) IBW/kg (Calculated) : 43.2 TPN AdjBW (KG): 55.2 Body mass index is 42.08 kg/m.  Assessment: Debra [name] is a 56 y.o. female s/p laparotomy, excision of greater omental mass, abdominal wall reconstruction with [name], appendectomy, and placement of Prevena vac.  Glucose / Insulin:  --No apparent history of diabetes --BG <180 mg/dL  --rSSI 4x/day + 22u insulin in TPN --SSI: 8u last 24h Electrolytes: Within normal limits Renal: SCr stable at approximate baseline  Hepatic: LFTs, bilirubin wnl GI Imaging: 9/11 CTAP: no new acute issues GI Surgeries / Procedures: s/p laparotomy, excision of greater omental mass, abdominal wall reconstruction with [name], appendectomy, and placement of Prevena vac  Central access: 02/15/22 TPN start date: 02/15/22  Nutritional Goals: Goal cyclic TPN over 16 hrs: cyclic TPN over 16 hrs: (provides 102 g of protein and 1817 kcals per day) total volume over 16hrs for calculations=1400 ml (1500 ml total with overfill)  RD Assessment:  Estimated Needs Total Energy Estimated Needs: 1800-2100kcal/day Total Protein Estimated Needs: 90-110g/d Total Fluid Estimated Needs: 1.4-1.6L/day  Current Nutrition:  Soft diet + nutritional supplements, not meeting PO needs yet   Plan: Transitioned to *Cyclic* TPN on 0/88.   to run over 16 hours per MD request. [name] at 2000 per discussion with Dietician to allow patient time for walking in afternoon/evenings.  To run over 16 hours: -Start rate at 49 mL/hr for 1 hour. -Increase rate to 98 mL/hr for 13 hours.  -Decrease rate to 49 mL/hr for 1 hour. -Decrease rate to 25 mL/hr for 1 hours, then stop.  Plan:  See cyclic TPN rate above Continue Nutritional Components Amino acids (using 15% Clinisol): 102 g Dextrose 19%  = 266 g Lipids (using 20% SMOFlipids): 50.4 g kCal: 1817/24h  Electrolytes in TPN: Na 70mq/L, K 469m/L, Ca 56m22mL, Mg 10 mEq/L, Phos 10 mmol/L, Cl:Ac 1:1 Continue MVI, trace elements, chromium and zinc per dietary recs. Continue CBG/SSI for Cyclic TPN:  -CBG 2 hrs after cyclic TPN start -CBG during middle of cyclic TPN infusion -CBG 1 hr after cylic TPN stopped -CBG while off TPN Continue resistant SSI + 20u insulin in TPN  Check TPN labs on Mon/Thurs at minimum Monitor for further need of diuresis and adjust as needed for fluid status zinc 5 mg added to TPN 12/16 per dietician recommendation.  AndDarrick PennaharmD Clinical Pharmacist 09/02/2022 7:47 AM

## 2022-09-02 NOTE — Consult Note (Signed)
[facility] Nurse wound follow up Pouch was last changed 12/18. Current [name] pouch with window is intact with good seal over abd full thickness wound with fistulas; mod amt brown liquid. Patient is performing intermittent suction by inserting yankour periodically. Supplies at bedside and instructions have been provided if they need to change the pouch PRN if leaking. 2 sets of large [name] pouches are at the bedside for staff nurse's use; [facility] # [phone]. Thank-you,  Julien Girt MSN, [place], [place], [place], [place]

## 2022-09-03 LAB — GLUCOSE, CAPILLARY
Glucose-Capillary: 149 mg/dL — ABNORMAL HIGH (ref 70–99)
Glucose-Capillary: 166 mg/dL — ABNORMAL HIGH (ref 70–99)
Glucose-Capillary: 173 mg/dL — ABNORMAL HIGH (ref 70–99)
Glucose-Capillary: 128 mg/dL — ABNORMAL HIGH (ref 70–99)
Glucose-Capillary: 115 mg/dL — ABNORMAL HIGH (ref 70–99)
Glucose-Capillary: 170 mg/dL — ABNORMAL HIGH (ref 70–99)

## 2022-09-03 MED ORDER — ZINC CHLORIDE 1 MG/ML IV SOLN
INTRAVENOUS | Status: AC
  Filled 2022-09-03: qty 681.33

## 2022-09-03 NOTE — Progress Notes (Signed)
[facility] SURGICAL PROGRESS NOTE (cpt [phone])  Hospital Day(s): 202.   Post op day(s): 200 Days Post-Op.   Interval History:  Patient seen and examined No nausea/emesis [name] last changed 12/26 by bedside RN No new labs [name] output 700 ccs + unmeasured; she is starting to manage effluent independently She is on soft diet; on cyclic TPN  Review of Systems:  Constitutional: denies fever, chills  HEENT: denies cough or congestion  Respiratory: denies any shortness of breath  Cardiovascular: denies chest pain or palpitations  Gastrointestinal: + Abdominal pain, denies N/V Genitourinary: denies burning with urination or urinary frequency Integumentary: + midline wound (stable)   Vital signs in last 24 hours: [min-max] current  Temp:  [97.3 F (36.3 C)-98.2 F (36.8 C)] 98.2 F (36.8 C) (12/27 0823) Pulse Rate:  [73-75] 73 (12/27 0823) Resp:  [16-18] 16 (12/27 0823) BP: (95-107)/(46-57) 107/50 (12/27 0823) SpO2:  [95 %-100 %] 100 % (12/27 0823) Weight:  [96.4 kg] 96.4 kg (12/27 0333)     Height: '4\' 11"'$  (149.9 cm) Weight: 96.4 kg BMI (Calculated): 43.04   Intake/Output last 2 shifts:  12/26 0701 - 12/27 0700 In: 589 [I.V.:589] Out: 700 [Drains:700]   Physical Exam:  Constitutional: alert, cooperative and no distress  Respiratory: breathing non-labored at rest  Cardiovascular: regular rate and sinus rhythm  Gastrointestinal: Soft, again with RLQ tenderness and suprapubic tenderness, non-distended, no rebound/guarding. Integumentary: Midline wound open, peritoneum closed; granulating; there are three stomatized areas visible in the LUQ and LLQ portions of the wound, output remains feculent. There is hypergranulation tissue to the inferior wound edge.     Labs:     Latest Ref Rng & Units 08/11/2022    4:23 AM 08/01/2022    4:29 AM 07/13/2022    7:05 AM  CBC  WBC 4.0 - 10.5 K/uL 4.8  5.7  4.5   Hemoglobin 12.0 - 15.0 g/dL 7.8  8.4  9.9   Hematocrit  36.0 - 46.0 % 24.1  25.7  31.1   Platelets 150 - 400 K/uL 138  137  115       Latest Ref Rng & Units 09/02/2022    8:23 AM 09/01/2022    5:31 PM 08/28/2022    9:13 AM  CMP  Glucose 70 - 99 mg/dL 186  159  170   BUN 6 - 20 mg/dL '21  17  21   '$ Creatinine 0.44 - 1.00 mg/dL 0.47  0.50  0.38   Sodium 135 - 145 mmol/L 134  133  137   Potassium 3.5 - 5.1 mmol/L 4.2  4.1  4.4   Chloride 98 - 111 mmol/L 108  107  109   CO2 22 - 32 mmol/L '19  21  23   '$ Calcium 8.9 - 10.3 mg/dL 8.4  8.2  8.4   Total Protein 6.5 - 8.1 g/dL  7.0  6.4   Total Bilirubin 0.3 - 1.2 mg/dL  0.5  0.6   Alkaline Phos 38 - 126 U/L  171  133   AST 15 - 41 U/L  32  32   ALT 0 - 44 U/L  33  23     Imaging studies: No new pertinent imaging studies this AM   Assessment/Plan:  56 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   - New 12/27: No acute changes today  - Wound Care; Changed 12/18 by bedside RN; No longer on intermittent or continuous suction. Patient seems to like this better and has started to manage effluent independently. She will need intermittent suction with Yankauer to control effluent. Likely be beneficial to suction before ambulation.   - Continue TPN; cyclic; monitor nutritional labs bi-weekly - Appreciate dietary assistance with trace elements; iron supplementation            - Monitor abdominal examination; on-going bowel function            - Pain control prn; antemetic prn            - Progressed with therapies; no longer any recommendations  - Appreciate neurosurgery assistance with compression fracture; serial XRs   - Discharge Planning: Continue to anticipate lengthy admission and potential eventual transfer; no new progress  regarding disposition unfortunately   All of the above findings and recommendations were discussed with the patient, and the medical team, and all of patient's questions were answered to her expressed satisfaction.  -- [name], PA-C [facility] 09/03/2022, 8:49 AM M-F: 7am - 4pm

## 2022-09-03 NOTE — Progress Notes (Signed)
Nutrition Follow-up  DOCUMENTATION CODES:   Obesity unspecified  INTERVENTION:   Continue cyclic TPN per pharmacy (16 hrs)- provides 1817kcal/day and 102g/day protein   Ensure Enlive po TID, each supplement provides 350 kcal and 20 grams of protein.  MVI, chromium and trace elements in TPN   Daily weights   Vitamin D2- 50,000 units po weekly   Zinc 74m daily added to TPN   RD will monitor iodine and zinc   NUTRITION DIAGNOSIS:   Increased nutrient needs related to wound healing, catabolic illness as evidenced by estimated needs.  GOAL:   Patient will meet greater than or equal to 90% of their needs -met with TPN   MONITOR:   PO intake, Supplement acceptance, Labs, Weight trends, Diet advancement, I & O's, TPN  ASSESSMENT:   56y/o female with h/o hypothyroidism, COVID 19 (3/21), kidney stones and stage 3 colon cancer (s/p left hemicolectomy 5/21 and chemotherapy) who is admitted for new pelvic mass now s/p laparotomy 6/8 (with excision of pelvic mass from greater omentum, abdominal wall reconstruction with bilateral myocutaneous flaps and mesh, incisional hernia repair, appendectomy repair and VAC placement) complicated by bowel perforation s/p reopening of recent laparotomy 6/10 (with repair of small bowel perforation, excision of mesh, placement of two phasix mesh and VAC placement). Pathology returned as metastatic adenocarcinoma. Pt with L1 compression fracture.   Pt continues to tolerate TPN well at goal rate; TPN is currently being cycled for 16 hours. Triglycerides wnl and are being checked monthly. Hyperglycemia improved with insulin in TPN. Volume status improved; pt remains up ~15lbs from admission. Vitamin labs improved; will continue to monitor zinc and Iodine levels monthly. Will monitor vitamin labs every other month. Pt continues to have poor oral intake. [name] pouch remains in place with 7054moutput. No discharge plan in place.   Medications reviewed and  include: D2, insulin, synthroid, protonix, carafate  -Selenium 93(L), Iodine 176.8(H), Chromium 2.3(H), copper 118 wnl, zinc 61 wnl, Manganese 18.6(H) - 12/7 -Iron 32.0 wnl, TIBC 330, ferritin 54 wnl, folate 14.4 wnl, B12 769- 12/7 -Vitamin B1- 91.2, vitamin D- 47.88, vitamin A- 21.5, B6- 22.8 wnl, vitamin C- 0.5, vitamin E- 12.7 wnl, vitamin K 0.82- 12/7   Labs reviewed: Na 134(L), K 4.2 wnl, BUN 21(H), P 3.4 wnl, Mg 2.1 wnl- 12/26 Triglycerides- 83-12/15 Cbgs- 128, 173, 166, 149 x 24 hrs  Diet Order:    Diet Order             DIET SOFT Room service appropriate? Yes; Fluid consistency: Thin  Diet effective now                  EDUCATION NEEDS:   Not appropriate for education at this time  Skin:  Skin Assessment: Reviewed RN Assessment ( Midline wound: 11cm x 12cm )  Last BM:  12/26  Height:   Ht Readings from Last 1 Encounters:  03/01/22 _0  (1.499 m)    Weight:   Wt Readings from Last 1 Encounters:  09/03/22 96.4 kg    Ideal Body Weight:  44.3 kg  BMI:  Body mass index is 42.92 kg/m.  Estimated Nutritional Needs:   Kcal:  1800-2100kcal/day  Protein:  90-110g/d  Fluid:  1.4-1.6L/day  CaKoleen DistanceS, RD, LDN Please refer to AM[facility] Centeror RD and/or RD on-call/weekend/after hours pager

## 2022-09-03 NOTE — Progress Notes (Signed)
PHARMACY - TOTAL PARENTERAL NUTRITION CONSULT NOTE   Indication: Prolonged ileus  Patient Measurements: Height: '4\' 11"'$  (149.9 cm) Weight: 96.4 kg (212 lb 8.4 oz) IBW/kg (Calculated) : 43.2 TPN AdjBW (KG): 55.2 Body mass index is 42.92 kg/m.  Assessment: Debra [name] is a 56 y.o. female s/p laparotomy, excision of greater omental mass, abdominal wall reconstruction with [name], appendectomy, and placement of Prevena vac.  Glucose / Insulin:  --No apparent history of diabetes --BG <180 mg/dL  --rSSI 4x/day + 22u insulin in TPN --SSI: 7u last 24h Electrolytes: Within normal limits Renal: SCr stable at approximate baseline  Hepatic: LFTs, bilirubin wnl GI Imaging: 9/11 CTAP: no new acute issues GI Surgeries / Procedures: s/p laparotomy, excision of greater omental mass, abdominal wall reconstruction with [name], appendectomy, and placement of Prevena vac  Central access: 02/15/22 TPN start date: 02/15/22  Nutritional Goals: Goal cyclic TPN over 16 hrs: cyclic TPN over 16 hrs: (provides 102 g of protein and 1817 kcals per day) total volume over 16hrs for calculations=1400 ml (1500 ml total with overfill)  RD Assessment:  Estimated Needs Total Energy Estimated Needs: 1800-2100kcal/day Total Protein Estimated Needs: 90-110g/d Total Fluid Estimated Needs: 1.4-1.6L/day  Current Nutrition:  Soft diet + nutritional supplements, not meeting PO needs yet   Plan: Transitioned to *Cyclic* TPN on 6/57.   to run over 16 hours per MD request. [name] at 2000 per discussion with Dietician to allow patient time for walking in afternoon/evenings.  To run over 16 hours: -Start rate at 49 mL/hr for 1 hour. -Increase rate to 98 mL/hr for 13 hours.  -Decrease rate to 49 mL/hr for 1 hour. -Decrease rate to 25 mL/hr for 1 hours, then stop.  Plan:  See cyclic TPN rate above Continue Nutritional Components Amino acids (using 15% Clinisol): 102 g Dextrose 19%  = 266 g Lipids (using 20% SMOFlipids): 50.4 g kCal: 1817/24h  Electrolytes in TPN: Na 76mq/L, K 427m/L, Ca 31m10mL, Mg 10 mEq/L, Phos 10 mmol/L, Cl:Ac 1:1 Continue MVI, trace elements, chromium and zinc per dietary recs. Continue CBG/SSI for Cyclic TPN:  -CBG 2 hrs after cyclic TPN start -CBG during middle of cyclic TPN infusion -CBG 1 hr after cylic TPN stopped -CBG while off TPN Continue resistant SSI + 20u insulin in TPN  Check TPN labs on Mon/Thurs at minimum Monitor for further need of diuresis and adjust as needed for fluid status zinc 5 mg added to TPN 12/16 per dietician recommendation.  AndDarrick PennaharmD Clinical Pharmacist 09/03/2022 7:38 AM

## 2022-09-04 LAB — MAGNESIUM: Magnesium: 2.2 mg/dL (ref 1.7–2.4)

## 2022-09-04 LAB — COMPREHENSIVE METABOLIC PANEL
ALT: 32 U/L (ref 0–44)
AST: 38 U/L (ref 15–41)
Albumin: 2.7 g/dL — ABNORMAL LOW (ref 3.5–5.0)
Alkaline Phosphatase: 181 U/L — ABNORMAL HIGH (ref 38–126)
Anion gap: 6 (ref 5–15)
BUN: 25 mg/dL — ABNORMAL HIGH (ref 6–20)
CO2: 21 mmol/L — ABNORMAL LOW (ref 22–32)
Calcium: 8.6 mg/dL — ABNORMAL LOW (ref 8.9–10.3)
Chloride: 106 mmol/L (ref 98–111)
Creatinine, Ser: 0.45 mg/dL (ref 0.44–1.00)
GFR, Estimated: >60 mL/min (ref >60)
Glucose, Bld: 180 mg/dL — ABNORMAL HIGH (ref 70–99)
Potassium: 4.4 mmol/L (ref 3.5–5.1)
Sodium: 133 mmol/L — ABNORMAL LOW (ref 135–145)
Total Bilirubin: 0.3 mg/dL (ref 0.3–1.2)
Total Protein: 6.9 g/dL (ref 6.5–8.1)

## 2022-09-04 LAB — GLUCOSE, CAPILLARY
Glucose-Capillary: 151 mg/dL — ABNORMAL HIGH (ref 70–99)
Glucose-Capillary: 125 mg/dL — ABNORMAL HIGH (ref 70–99)
Glucose-Capillary: 81 mg/dL (ref 70–99)
Glucose-Capillary: 136 mg/dL — ABNORMAL HIGH (ref 70–99)

## 2022-09-04 LAB — PHOSPHORUS: Phosphorus: 3.4 mg/dL (ref 2.5–4.6)

## 2022-09-04 MED ORDER — ZINC CHLORIDE 1 MG/ML IV SOLN
INTRAVENOUS | Status: AC
  Filled 2022-09-04: qty 681.33

## 2022-09-04 NOTE — Progress Notes (Signed)
PHARMACY - TOTAL PARENTERAL NUTRITION CONSULT NOTE   Indication: Prolonged ileus  Patient Measurements: Height: '4\' 11"'$  (149.9 cm) Weight: 93.7 kg (206 lb 9.1 oz) IBW/kg (Calculated) : 43.2 TPN AdjBW (KG): 55.2 Body mass index is 41.72 kg/m.  Assessment: Debra [name] is a 56 y.o. female s/p laparotomy, excision of greater omental mass, abdominal wall reconstruction with [name], appendectomy, and placement of Prevena vac.  Glucose / Insulin:  --No apparent history of diabetes --BG <180 mg/dL  --rSSI 4x/day + 20u insulin in TPN --SSI: 8u last 24h Electrolytes: Within normal limits Renal: SCr stable at approximate baseline  Hepatic: LFTs, bilirubin wnl GI Imaging: 9/11 CTAP: no new acute issues GI Surgeries / Procedures: s/p laparotomy, excision of greater omental mass, abdominal wall reconstruction with [name], appendectomy, and placement of Prevena vac  Central access: 02/15/22 TPN start date: 02/15/22  Nutritional Goals: Goal cyclic TPN over 16 hrs: cyclic TPN over 16 hrs: (provides 102 g of protein and 1817 kcals per day) total volume over 16hrs for calculations=1400 ml (1500 ml total with overfill)  RD Assessment:  Estimated Needs Total Energy Estimated Needs: 1800-2100kcal/day Total Protein Estimated Needs: 90-110g/d Total Fluid Estimated Needs: 1.4-1.6L/day  Current Nutrition:  Soft diet + nutritional supplements, not meeting PO needs yet   Plan: Transitioned to *Cyclic* TPN on 4/32.   to run over 16 hours per MD request. [name] at 2000 per discussion with Dietician to allow patient time for walking in afternoon/evenings.  To run over 16 hours: -Start rate at 49 mL/hr for 1 hour. -Increase rate to 98 mL/hr for 13 hours.  -Decrease rate to 49 mL/hr for 1 hour. -Decrease rate to 25 mL/hr for 1 hours, then stop.  Plan:  See cyclic TPN rate above Continue Nutritional Components Amino acids (using 15% Clinisol): 102 g Dextrose 19%  = 266 g Lipids (using 20% SMOFlipids): 50.4 g kCal: 1817/24h  Electrolytes in TPN: Na 41mq/L, K 477m/L, Ca 25m57mL, Mg 10 mEq/L, Phos 10 mmol/L, Cl:Ac 1:1 Continue MVI, trace elements, chromium and zinc per dietary recs. Continue CBG/SSI for Cyclic TPN:  -CBG 2 hrs after cyclic TPN start -CBG during middle of cyclic TPN infusion -CBG 1 hr after cylic TPN stopped -CBG while off TPN Continue resistant SSI + 20u insulin in TPN  Check TPN labs on Mon/Thurs at minimum Monitor for further need of diuresis and adjust as needed for fluid status zinc 5 mg added to TPN 12/16 per dietician recommendation.  AndDarrick PennaharmD Clinical Pharmacist 09/04/2022 7:58 AM

## 2022-09-04 NOTE — Progress Notes (Addendum)
[facility] SURGICAL PROGRESS NOTE (cpt [phone])  Hospital Day(s): 203.   Post op day(s): 201 Days Post-Op.   Interval History:  Patient seen and examined No nausea/emesis [name] last changed 12/26 by bedside RN No new labs today [name] output 150 ccs + unmeasured; she is starting to manage effluent independently She is on soft diet; on cyclic TPN  Review of Systems:  Constitutional: denies fever, chills  HEENT: denies cough or congestion  Respiratory: denies any shortness of breath  Cardiovascular: denies chest pain or palpitations  Gastrointestinal: + Abdominal pain, denies N/V Genitourinary: denies burning with urination or urinary frequency Integumentary: + midline wound (stable)   Vital signs in last 24 hours: [min-max] current  Temp:  [97.8 F (36.6 C)-98.2 F (36.8 C)] 97.8 F (36.6 C) (12/28 0411) Pulse Rate:  [73-75] 73 (12/28 0411) Resp:  [15-19] 15 (12/28 0411) BP: (92-112)/(50-60) 92/54 (12/28 0411) SpO2:  [99 %-100 %] 100 % (12/28 0411) Weight:  [93.7 kg] 93.7 kg (12/28 0415)     Height: '4\' 11"'$  (149.9 cm) Weight: 93.7 kg BMI (Calculated): 43.04   Intake/Output last 2 shifts:  12/27 0701 - 12/28 0700 In: 721.9 [I.V.:721.9] Out: 150 [Drains:150]   Physical Exam:  Constitutional: alert, cooperative and no distress  Respiratory: breathing non-labored at rest  Cardiovascular: regular rate and sinus rhythm  Gastrointestinal: Soft, rather unchanged suprapubic tenderness (mild), non-distended, no rebound/guarding. Integumentary: Midline wound open, peritoneum closed; granulating; there are three stomatized areas visible in the LUQ and LLQ portions of the wound, output remains feculent. There is hypergranulation tissue to the inferior wound edge.     Labs:     Latest Ref Rng & Units 08/11/2022    4:23 AM 08/01/2022    4:29 AM 07/13/2022    7:05 AM  CBC  WBC 4.0 - 10.5 K/uL 4.8  5.7  4.5   Hemoglobin 12.0 - 15.0 g/dL 7.8  8.4  9.9   Hematocrit  36.0 - 46.0 % 24.1  25.7  31.1   Platelets 150 - 400 K/uL 138  137  115       Latest Ref Rng & Units 09/02/2022    8:23 AM 09/01/2022    5:31 PM 08/28/2022    9:13 AM  CMP  Glucose 70 - 99 mg/dL 186  159  170   BUN 6 - 20 mg/dL '21  17  21   '$ Creatinine 0.44 - 1.00 mg/dL 0.47  0.50  0.38   Sodium 135 - 145 mmol/L 134  133  137   Potassium 3.5 - 5.1 mmol/L 4.2  4.1  4.4   Chloride 98 - 111 mmol/L 108  107  109   CO2 22 - 32 mmol/L '19  21  23   '$ Calcium 8.9 - 10.3 mg/dL 8.4  8.2  8.4   Total Protein 6.5 - 8.1 g/dL  7.0  6.4   Total Bilirubin 0.3 - 1.2 mg/dL  0.5  0.6   Alkaline Phos 38 - 126 U/L  171  133   AST 15 - 41 U/L  32  32   ALT 0 - 44 U/L  33  23     Imaging studies: No new pertinent imaging studies this AM   Assessment/Plan:  56 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   - New 12/28: No acute changes today  - Wound Care; Changed 12/26; No longer on intermittent or continuous suction. Patient seems to like this better and has started to manage effluent independently. She will need intermittent suction with Yankauer to control effluent. Likely be beneficial to suction before ambulation.   - Continue TPN; cyclic; monitor nutritional labs bi-weekly - Appreciate dietary assistance with trace elements; iron supplementation            - Monitor abdominal examination; on-going bowel function            - Pain control prn; antemetic prn            - Progressed with therapies; no longer any recommendations  - Appreciate neurosurgery assistance with compression fracture; serial XRs   - Discharge Planning: Continue to anticipate lengthy admission +/- possible transfer; no new progress regarding disposition  unfortunately   All of the above findings and recommendations were discussed with the patient, and the medical team, and all of patient's questions were answered to her expressed satisfaction.  -- [name], PA-C [facility] 09/04/2022, 7:58 AM M-F: 7am - 4pm

## 2022-09-04 NOTE — TOC Progression Note (Signed)
Transition of Care [facility]) - Progression Note    Patient Details  Name: Debra [name] MRN: [id number] Date of Birth: 1966/02/04  [facility] CM/SW Contact  Beverly Sessions, RN Phone Number: 09/04/2022, 3:11 PM  Clinical Narrative:      No new update per surgery Message sent to wound nurse to determine the process to get Eakins pouches outpatient and what the cost would be   Expected Discharge Plan: [place] Barriers to Discharge: Continued Medical Work up  Expected Discharge Plan and Services                                               Social Determinants of Health (SDOH) Interventions SDOH Screenings   Food Insecurity: No Food Insecurity (05/20/2022)  Housing: Low Risk  (05/20/2022)  Transportation Needs: No Transportation Needs (05/20/2022)  Utilities: Not At Risk (05/20/2022)  Tobacco Use: Low Risk  (08/10/2022)    Readmission Risk Interventions     No data to display

## 2022-09-04 NOTE — Consult Note (Signed)
[place] Nurse wound follow up Patient indicates last pouch change was 09/02/22- 2 days ago.  Is getting 7-8 days usually.  Wound type:midline abdominal fistula Drainage (amount, consistency, odor) moderate brown feculent effluent Periwound:pouch intact Dressing procedure/placement/frequency: Change when leaking every 7-10 days. 1 pouch set at bedside.  Asked Network engineer to order additional [name] pouches. Will follow Estrellita Ludwig MSN, RN, FNP-BC CWON Wound, Ostomy, Continence Nurse [facility] [phone] Pager [phone]

## 2022-09-05 LAB — GLUCOSE, CAPILLARY
Glucose-Capillary: 144 mg/dL — ABNORMAL HIGH (ref 70–99)
Glucose-Capillary: 148 mg/dL — ABNORMAL HIGH (ref 70–99)
Glucose-Capillary: 95 mg/dL (ref 70–99)
Glucose-Capillary: 121 mg/dL — ABNORMAL HIGH (ref 70–99)

## 2022-09-05 MED ORDER — ZINC CHLORIDE 1 MG/ML IV SOLN
INTRAVENOUS | Status: AC
  Filled 2022-09-05: qty 681.33

## 2022-09-05 NOTE — Progress Notes (Signed)
[facility] SURGICAL PROGRESS NOTE (cpt [phone])  Hospital Day(s): 204.   Post op day(s): 202 Days Post-Op.   Interval History:  No acute events overnight.  Patient reports today her suprapubic pain is improving.  Denies any nausea or vomiting.  Review of Systems:  Constitutional: denies fever, chills  HEENT: denies cough or congestion  Respiratory: denies any shortness of breath  Cardiovascular: denies chest pain or palpitations  Gastrointestinal: + Suprapubic pain, denies N/V Genitourinary: denies burning with urination or urinary frequency Integumentary: + midline wound (stable)   Vital signs in last 24 hours: [min-max] current  Temp:  [98.2 F (36.8 C)-98.6 F (37 C)] 98.6 F (37 C) (12/29 0828) Pulse Rate:  [76-78] 78 (12/29 0828) Resp:  [17-20] 17 (12/29 0828) BP: (102-108)/(54-68) 108/54 (12/29 0828) SpO2:  [98 %-100 %] 100 % (12/29 0828)     Height: '4\' 11"'$  (149.9 cm) Weight: 93.7 kg BMI (Calculated): 43.04   Intake/Output last 2 shifts:  12/28 0701 - 12/29 0700 In: 1121.8 [P.O.:118; I.V.:1003.8] Out: 400 [Urine:400]   Physical Exam:  Constitutional: alert, cooperative and no distress  Respiratory: breathing non-labored at rest  Cardiovascular: regular rate and sinus rhythm  Gastrointestinal: Soft, improved suprapubic tenderness (mild), non-distended, no rebound/guarding. Integumentary: Midline wound open, peritoneum closed; granulating; there are three stomatized areas visible in the LUQ and LLQ portions of the wound, output remains feculent. There is hypergranulation tissue to the inferior wound edge.     Labs:     Latest Ref Rng & Units 08/11/2022    4:23 AM 08/01/2022    4:29 AM 07/13/2022    7:05 AM  CBC  WBC 4.0 - 10.5 K/uL 4.8  5.7  4.5   Hemoglobin 12.0 - 15.0 g/dL 7.8  8.4  9.9   Hematocrit 36.0 - 46.0 % 24.1  25.7  31.1   Platelets 150 - 400 K/uL 138  137  115       Latest Ref Rng & Units 09/04/2022    8:01 AM 09/02/2022    8:23  AM 09/01/2022    5:31 PM  CMP  Glucose 70 - 99 mg/dL 180  186  159   BUN 6 - 20 mg/dL '25  21  17   '$ Creatinine 0.44 - 1.00 mg/dL 0.45  0.47  0.50   Sodium 135 - 145 mmol/L 133  134  133   Potassium 3.5 - 5.1 mmol/L 4.4  4.2  4.1   Chloride 98 - 111 mmol/L 106  108  107   CO2 22 - 32 mmol/L '21  19  21   '$ Calcium 8.9 - 10.3 mg/dL 8.6  8.4  8.2   Total Protein 6.5 - 8.1 g/dL 6.9   7.0   Total Bilirubin 0.3 - 1.2 mg/dL 0.3   0.5   Alkaline Phos 38 - 126 U/L 181   171   AST 15 - 41 U/L 38   32   ALT 0 - 44 U/L 32   33     Imaging studies: No new pertinent imaging studies this AM   Assessment/Plan:  56 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   - New 12/29: No acute changes today  - Wound Care; Changed 12/26; No longer on intermittent or continuous suction. Patient seems to  like this better and has started to manage effluent independently. She will need intermittent suction with Yankauer to control effluent. Likely be beneficial to suction before ambulation.   - Continue TPN; cyclic; monitor nutritional labs bi-weekly - Appreciate dietary assistance with trace elements; iron supplementation            - Monitor abdominal examination; on-going bowel function            - Pain control prn; antemetic prn            - Progressed with therapies; no longer any recommendations  - Appreciate neurosurgery assistance with compression fracture; serial XRs   - Discharge Planning: Continue to anticipate lengthy admission +/- possible transfer; no new progress regarding disposition unfortunately   All of the above findings and recommendations were discussed with the patient, and the medical team, and all of patient's questions were  answered to her expressed satisfaction.  I spent 35 minutes dedicated to the care of this patient on the date of this encounter to include pre-visit review of records, face-to-face time with the patient discussing diagnosis and management, and any post-visit coordination of care.   Olean Ree, MD

## 2022-09-05 NOTE — Progress Notes (Signed)
PHARMACY - TOTAL PARENTERAL NUTRITION CONSULT NOTE   Indication: Prolonged ileus  Patient Measurements: Height: '4\' 11"'$  (149.9 cm) Weight: 93.7 kg (206 lb 9.1 oz) IBW/kg (Calculated) : 43.2 TPN AdjBW (KG): 55.2 Body mass index is 41.72 kg/m.  Assessment: Debra [name] is a 56 y.o. female s/p laparotomy, excision of greater omental mass, abdominal wall reconstruction with [name], appendectomy, and placement of Prevena vac.  Glucose / Insulin:  --No apparent history of diabetes --BG <180 mg/dL  --rSSI 4x/day + 20u insulin in TPN --SSI: 9u last 24h Electrolytes: Within normal limits Renal: SCr stable at approximate baseline  Hepatic: LFTs, bilirubin wnl GI Imaging: 9/11 CTAP: no new acute issues GI Surgeries / Procedures: s/p laparotomy, excision of greater omental mass, abdominal wall reconstruction with [name], appendectomy, and placement of Prevena vac  Central access: 02/15/22 TPN start date: 02/15/22  Nutritional Goals: Goal cyclic TPN over 16 hrs: cyclic TPN over 16 hrs: (provides 102 g of protein and 1817 kcals per day) total volume over 16hrs for calculations=1400 ml (1500 ml total with overfill)  RD Assessment:  Estimated Needs Total Energy Estimated Needs: 1800-2100kcal/day Total Protein Estimated Needs: 90-110g/d Total Fluid Estimated Needs: 1.4-1.6L/day  Current Nutrition:  Soft diet + nutritional supplements, not meeting PO needs yet   Plan: Transitioned to *Cyclic* TPN on 2/83.   to run over 16 hours per MD request. [name] at 2000 per discussion with Dietician to allow patient time for walking in afternoon/evenings.  To run over 16 hours: -Start rate at 49 mL/hr for 1 hour. -Increase rate to 98 mL/hr for 13 hours.  -Decrease rate to 49 mL/hr for 1 hour. -Decrease rate to 25 mL/hr for 1 hours, then stop.  Plan:  See cyclic TPN rate above Continue Nutritional Components Amino acids (using 15% Clinisol): 102 g Dextrose 19%  = 266 g Lipids (using 20% SMOFlipids): 50.4 g kCal: 1817/24h  Electrolytes in TPN: Na 48mq/L, K 431m/L, Ca 8m68mL, Mg 10 mEq/L, Phos 10 mmol/L, Cl:Ac 1:1 Continue MVI, trace elements, chromium and zinc per dietary recs. Continue CBG/SSI for Cyclic TPN:  -CBG 2 hrs after cyclic TPN start -CBG during middle of cyclic TPN infusion -CBG 1 hr after cylic TPN stopped -CBG while off TPN Continue resistant SSI + 20u insulin in TPN  Check TPN labs on Mon/Thurs at minimum Monitor for further need of diuresis and adjust as needed for fluid status zinc 5 mg added to TPN 12/16 per dietician recommendation.  AndDarrick PennaharmD Clinical Pharmacist 09/05/2022 8:17 AM

## 2022-09-06 MED ORDER — ZINC CHLORIDE 1 MG/ML IV SOLN
INTRAVENOUS | Status: AC
  Filled 2022-09-06: qty 681.33

## 2022-09-06 NOTE — Plan of Care (Signed)
  Problem: Clinical Measurements: Goal: Will remain free from infection Outcome: Progressing   Problem: Nutrition: Goal: Adequate nutrition will be maintained Outcome: Progressing   Problem: Activity: Goal: Risk for activity intolerance will decrease Outcome: Progressing   Problem: Nutrition: Goal: Adequate nutrition will be maintained Outcome: Progressing   Problem: Coping: Goal: Level of anxiety will decrease Outcome: Progressing   Problem: Pain Managment: Goal: General experience of comfort will improve Outcome: Progressing

## 2022-09-06 NOTE — Progress Notes (Signed)
PHARMACY - TOTAL PARENTERAL NUTRITION CONSULT NOTE   Indication: Prolonged ileus  Patient Measurements: Height: '4\' 11"'$  (149.9 cm) Weight: 93.7 kg (206 lb 9.1 oz) IBW/kg (Calculated) : 43.2 TPN AdjBW (KG): 55.2 Body mass index is 41.72 kg/m.  Assessment: Debra [name] is a 56 y.o. female s/p laparotomy, excision of greater omental mass, abdominal wall reconstruction with [name], appendectomy, and placement of Prevena vac.  Glucose / Insulin:  --No apparent history of diabetes --BG <180 mg/dL  --rSSI 4x/day + 20u insulin in TPN --SSI: 9u last 24h Electrolytes: Within normal limits Renal: SCr stable at approximate baseline  Hepatic: LFTs, bilirubin wnl GI Imaging: 9/11 CTAP: no new acute issues GI Surgeries / Procedures: s/p laparotomy, excision of greater omental mass, abdominal wall reconstruction with [name], appendectomy, and placement of Prevena vac  Central access: 02/15/22 TPN start date: 02/15/22  Nutritional Goals: Goal cyclic TPN over 16 hrs: cyclic TPN over 16 hrs: (provides 102 g of protein and 1817 kcals per day) total volume over 16hrs for calculations=1400 ml (1500 ml total with overfill)  RD Assessment:  Estimated Needs Total Energy Estimated Needs: 1800-2100kcal/day Total Protein Estimated Needs: 90-110g/d Total Fluid Estimated Needs: 1.4-1.6L/day  Current Nutrition:  Soft diet + nutritional supplements, not meeting PO needs yet   Plan: Transitioned to *Cyclic* TPN on 9/37.   to run over 16 hours per MD request. [name] at 2000 per discussion with Dietician to allow patient time for walking in afternoon/evenings.  To run over 16 hours: -Start rate at 49 mL/hr for 1 hour. -Increase rate to 98 mL/hr for 13 hours.  -Decrease rate to 49 mL/hr for 1 hour. -Decrease rate to 25 mL/hr for 1 hours, then stop.  Plan:  See cyclic TPN rate above Continue Nutritional Components Amino acids (using 15% Clinisol): 102 g Dextrose 19%  = 266 g Lipids (using 20% SMOFlipids): 50.4 g kCal: 1817/24h  Electrolytes in TPN: Na 2mq/L, K 454m/L, Ca 34m64mL, Mg 10 mEq/L, Phos 10 mmol/L, Cl:Ac 1:1 Continue MVI, trace elements, chromium and zinc per dietary recs. Continue CBG/SSI for Cyclic TPN:  -CBG 2 hrs after cyclic TPN start -CBG during middle of cyclic TPN infusion -CBG 1 hr after cylic TPN stopped -CBG while off TPN Continue resistant SSI + 20u insulin in TPN  Check TPN labs on Mon/Thurs at minimum Monitor for further need of diuresis and adjust as needed for fluid status zinc 5 mg added to TPN 12/16 per dietician recommendation.  KisOswald HillockharmD Clinical Pharmacist 09/06/2022 9:54 AM

## 2022-09-06 NOTE — Progress Notes (Addendum)
Subjective:  CC: Debra [name] is a 56 y.o. female  Hospital stay day 205, 203 Days Post-Op s/p re-opening of laparotomy for repair of small bowel perforation following initial laparotomy, excision of greater omental mass, abdominal wall reconstruction with [name], appendectomy, and placement of Prevena vac on 06/08.   HPI: Notified by night nurse of persistent area of bleeding again near one of the fistula sites.  Otherwise no complaints reported.  ROS:  Relevant positives and negative noted in HPI  Objective:   Temp:  [97.8 F (36.6 C)-98.6 F (37 C)] 98.1 F (36.7 C) (12/30 0527) Pulse Rate:  [69-78] 73 (12/30 0527) Resp:  [17-20] 18 (12/30 0527) BP: (91-108)/(54-57) 104/56 (12/30 0527) SpO2:  [98 %-100 %] 98 % (12/30 0527)     Height: '4\' 11"'$  (149.9 cm) Weight: 93.7 kg BMI (Calculated): 43.04   Intake/Output this shift:   Intake/Output Summary (Last 24 hours) at 09/06/2022 7893 Last data filed at 09/06/2022 0700 Gross per 24 hour  Intake 970.11 ml  Output 700 ml  Net 270.11 ml    Constitutional :  alert, cooperative, appears stated age, and no distress  Respiratory:  clear to auscultation bilaterally  Cardiovascular:  regular rate and rhythm  Gastrointestinal: soft, non-tender; bowel sounds normal; no masses,  no organomegaly.  Fistula site with area of bleeding noted on the left lateral fistula at the granulation tissue adjacent to the small bowel.  Slow venous oozing but persistent  Skin: Cool and moist.   Psychiatric: Normal affect, non-agitated, not confused       LABS:     Latest Ref Rng & Units 09/04/2022    8:01 AM 09/02/2022    8:23 AM 09/01/2022    5:31 PM  CMP  Glucose 70 - 99 mg/dL 180  186  159   BUN 6 - 20 mg/dL '25  21  17   '$ Creatinine 0.44 - 1.00 mg/dL 0.45  0.47  0.50   Sodium 135 - 145 mmol/L 133  134  133   Potassium 3.5 - 5.1 mmol/L 4.4  4.2  4.1   Chloride 98 - 111 mmol/L 106  108  107   CO2 22 - 32 mmol/L '21  19  21    '$ Calcium 8.9 - 10.3 mg/dL 8.6  8.4  8.2   Total Protein 6.5 - 8.1 g/dL 6.9   7.0   Total Bilirubin 0.3 - 1.2 mg/dL 0.3   0.5   Alkaline Phos 38 - 126 U/L 181   171   AST 15 - 41 U/L 38   32   ALT 0 - 44 U/L 32   33       Latest Ref Rng & Units 08/11/2022    4:23 AM 08/01/2022    4:29 AM 07/13/2022    7:05 AM  CBC  WBC 4.0 - 10.5 K/uL 4.8  5.7  4.5   Hemoglobin 12.0 - 15.0 g/dL 7.8  8.4  9.9   Hematocrit 36.0 - 46.0 % 24.1  25.7  31.1   Platelets 150 - 400 K/uL 138  137  115     RADS: N/a Assessment:   s/p re-opening of laparotomy for repair of small bowel perforation following initial laparotomy, excision of greater omental mass, abdominal wall reconstruction with [name], appendectomy, and placement of Prevena vac on 06/08.   Area of bleeding x 2 controlled with silver nitrate application and manual pressure.  Hemostasis noted to be confirmed afterwards.  [name] pouch  was changed at bedside by myself with nurse.  Previous [name] pouch removed and surrounding skin cleansed with warm water.  Stoma powder applied to the normal skin at the edges.  Outline of the persistent wound marks in the [name]'s pouch with lid cut to shape.  Additional reinforcement noted adhesives placed at the inferior edge of the wound prior to applying the bag.  Patient tolerated procedure well.    Continue to monitor area for recurrent bleeding.  Otherwise, continue present management.  labs/images/medications/previous chart entries reviewed personally and relevant changes/updates noted above.

## 2022-09-07 LAB — GLUCOSE, CAPILLARY
Glucose-Capillary: 125 mg/dL — ABNORMAL HIGH (ref 70–99)
Glucose-Capillary: 98 mg/dL (ref 70–99)

## 2022-09-07 MED ORDER — ZINC CHLORIDE 1 MG/ML IV SOLN
INTRAVENOUS | Status: AC
  Filled 2022-09-07: qty 681.33

## 2022-09-07 NOTE — Progress Notes (Signed)
PHARMACY - TOTAL PARENTERAL NUTRITION CONSULT NOTE   Indication: Prolonged ileus  Patient Measurements: Height: '4\' 11"'$  (149.9 cm) Weight: 95.1 kg (209 lb 10.5 oz) IBW/kg (Calculated) : 43.2 TPN AdjBW (KG): 55.2 Body mass index is 42.35 kg/m.  Assessment: Debra [name] is a 56 y.o. female s/p laparotomy, excision of greater omental mass, abdominal wall reconstruction with [name], appendectomy, and placement of Prevena vac.  Glucose / Insulin:  --No apparent history of diabetes --BG <180 mg/dL  --rSSI 4x/day + 20u insulin in TPN --SSI: 9u last 24h Electrolytes: Within normal limits Renal: SCr stable at approximate baseline  Hepatic: LFTs, bilirubin wnl GI Imaging: 9/11 CTAP: no new acute issues GI Surgeries / Procedures: s/p laparotomy, excision of greater omental mass, abdominal wall reconstruction with [name], appendectomy, and placement of Prevena vac  Central access: 02/15/22 TPN start date: 02/15/22  Nutritional Goals: Goal cyclic TPN over 16 hrs: cyclic TPN over 16 hrs: (provides 102 g of protein and 1817 kcals per day) total volume over 16hrs for calculations=1400 ml (1500 ml total with overfill)  RD Assessment:  Estimated Needs Total Energy Estimated Needs: 1800-2100kcal/day Total Protein Estimated Needs: 90-110g/d Total Fluid Estimated Needs: 1.4-1.6L/day  Current Nutrition:  Soft diet + nutritional supplements, not meeting PO needs yet   Plan: Transitioned to *Cyclic* TPN on 7/16.   to run over 16 hours per MD request. [name] at 2000 per discussion with Dietician to allow patient time for walking in afternoon/evenings.  To run over 16 hours: -Start rate at 49 mL/hr for 1 hour. -Increase rate to 98 mL/hr for 13 hours.  -Decrease rate to 49 mL/hr for 1 hour. -Decrease rate to 25 mL/hr for 1 hours, then stop.  Plan:  See cyclic TPN rate above Continue Nutritional Components Amino acids (using 15% Clinisol): 102 g Dextrose 19%  = 266 g Lipids (using 20% SMOFlipids): 50.4 g kCal: 1817/24h  Electrolytes in TPN: Na 21mq/L, K 479m/L, Ca 73m14mL, Mg 10 mEq/L, Phos 10 mmol/L, Cl:Ac 1:1 Continue MVI, trace elements, chromium and zinc per dietary recs. Continue CBG/SSI for Cyclic TPN:  -CBG 2 hrs after cyclic TPN start -CBG during middle of cyclic TPN infusion -CBG 1 hr after cylic TPN stopped -CBG while off TPN Continue resistant SSI + 20u insulin in TPN . Seems pt may be refusing CBG checks.  Check TPN labs on Mon/Thurs at minimum Monitor for further need of diuresis and adjust as needed for fluid status zinc 5 mg added to TPN 12/16 per dietician recommendation.  KisOswald HillockharmD Clinical Pharmacist 09/07/2022 10:29 AM

## 2022-09-07 NOTE — Plan of Care (Signed)
  Problem: Clinical Measurements: Goal: Will remain free from infection Outcome: Progressing   Problem: Nutrition: Goal: Adequate nutrition will be maintained Outcome: Progressing   Problem: Activity: Goal: Risk for activity intolerance will decrease Outcome: Progressing   Problem: Nutrition: Goal: Adequate nutrition will be maintained Outcome: Progressing   Problem: Coping: Goal: Level of anxiety will decrease Outcome: Progressing   Problem: Pain Managment: Goal: General experience of comfort will improve Outcome: Progressing

## 2022-09-07 NOTE — Progress Notes (Signed)
[facility] (cpt [phone])  Hospital Day(s): 206.   Post op day(s): 204 Days Post-Op.   Interval History:  No acute events overnight.  No further bleeding episodes from the wound after intervention with Dr. Lysle Pearl today morning.  Denies any worsening pain.  Tolerating a soft diet.  Review of Systems:  Constitutional: denies fever, chills  HEENT: denies cough or congestion  Respiratory: denies any shortness of breath  Cardiovascular: denies chest pain or palpitations  Gastrointestinal: + Suprapubic pain, denies N/V Genitourinary: denies burning with urination or urinary frequency Integumentary: + midline wound (stable)   Vital signs in last 24 hours: [min-max] current  Temp:  [97.9 F (36.6 C)-98.4 F (36.9 C)] 98.4 F (36.9 C) (12/31 0830) Pulse Rate:  [72-82] 78 (12/31 0830) Resp:  [17-18] 18 (12/31 0830) BP: (96-106)/(54-58) 106/54 (12/31 0830) SpO2:  [98 %-100 %] 98 % (12/31 0830) Weight:  [95.1 kg] 95.1 kg (12/31 0500)     Height: '4\' 11"'$  (149.9 cm) Weight: 95.1 kg BMI (Calculated): 43.04   Intake/Output last 2 shifts:  12/30 0701 - 12/31 0700 In: 1136.7 [P.O.:300; I.V.:836.7] Out: 990 [Drains:990]   Physical Exam:  Constitutional: alert, cooperative and no distress  Respiratory: breathing non-labored at rest  Cardiovascular: regular rate and sinus rhythm  Gastrointestinal: Soft, improved suprapubic tenderness (mild), non-distended, no rebound/guarding. Integumentary: Midline wound open, peritoneum closed; granulating; there are three stomatized areas visible in the LUQ and LLQ portions of the wound, output remains feculent. There is hypergranulation tissue to the inferior wound edge.  No bleeding noted.    Labs:     Latest Ref Rng & Units 08/11/2022    4:23 AM 08/01/2022    4:29 AM 07/13/2022    7:05 AM  CBC  WBC 4.0 - 10.5 K/uL 4.8  5.7  4.5   Hemoglobin 12.0 - 15.0 g/dL 7.8  8.4  9.9   Hematocrit 36.0 - 46.0 % 24.1  25.7   31.1   Platelets 150 - 400 K/uL 138  137  115       Latest Ref Rng & Units 09/04/2022    8:01 AM 09/02/2022    8:23 AM 09/01/2022    5:31 PM  CMP  Glucose 70 - 99 mg/dL 180  186  159   BUN 6 - 20 mg/dL '25  21  17   '$ Creatinine 0.44 - 1.00 mg/dL 0.45  0.47  0.50   Sodium 135 - 145 mmol/L 133  134  133   Potassium 3.5 - 5.1 mmol/L 4.4  4.2  4.1   Chloride 98 - 111 mmol/L 106  108  107   CO2 22 - 32 mmol/L '21  19  21   '$ Calcium 8.9 - 10.3 mg/dL 8.6  8.4  8.2   Total Protein 6.5 - 8.1 g/dL 6.9   7.0   Total Bilirubin 0.3 - 1.2 mg/dL 0.3   0.5   Alkaline Phos 38 - 126 U/L 181   171   AST 15 - 41 U/L 38   32   ALT 0 - 44 U/L 32   33     Imaging studies: No new pertinent imaging studies this AM   Assessment/Plan:  56 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   - New  12/31: No acute changes today  - Wound Care; Changed 12/30; No longer on intermittent or continuous suction. Patient seems to like this better and has started to manage effluent independently. She will need intermittent suction with Yankauer to control effluent. Likely be beneficial to suction before ambulation.   - Continue TPN; cyclic; monitor nutritional labs bi-weekly - Appreciate dietary assistance with trace elements; iron supplementation            - Monitor abdominal examination; on-going bowel function            - Pain control prn; antemetic prn            - Progressed with therapies; no longer any recommendations  - Appreciate neurosurgery assistance with compression fracture; serial XRs   - Discharge Planning: Continue to anticipate lengthy admission +/- possible transfer; no new progress regarding disposition unfortunately   All of the above  findings and recommendations were discussed with the patient, and the medical team, and all of patient's questions were answered to her expressed satisfaction.  I spent 35 minutes dedicated to the care of this patient on the date of this encounter to include pre-visit review of records, face-to-face time with the patient discussing diagnosis and management, and any post-visit coordination of care.   Olean Ree, MD

## 2022-09-08 LAB — GLUCOSE, CAPILLARY
Glucose-Capillary: 109 mg/dL — ABNORMAL HIGH (ref 70–99)
Glucose-Capillary: 175 mg/dL — ABNORMAL HIGH (ref 70–99)
Glucose-Capillary: 122 mg/dL — ABNORMAL HIGH (ref 70–99)
Glucose-Capillary: 127 mg/dL — ABNORMAL HIGH (ref 70–99)

## 2022-09-08 LAB — COMPREHENSIVE METABOLIC PANEL
ALT: 47 U/L — ABNORMAL HIGH (ref 0–44)
AST: 48 U/L — ABNORMAL HIGH (ref 15–41)
Albumin: 2.6 g/dL — ABNORMAL LOW (ref 3.5–5.0)
Alkaline Phosphatase: 174 U/L — ABNORMAL HIGH (ref 38–126)
Anion gap: 4 — ABNORMAL LOW (ref 5–15)
BUN: 27 mg/dL — ABNORMAL HIGH (ref 6–20)
CO2: 22 mmol/L (ref 22–32)
Calcium: 8.3 mg/dL — ABNORMAL LOW (ref 8.9–10.3)
Chloride: 107 mmol/L (ref 98–111)
Creatinine, Ser: 0.44 mg/dL (ref 0.44–1.00)
GFR, Estimated: >60 mL/min (ref >60)
Glucose, Bld: 163 mg/dL — ABNORMAL HIGH (ref 70–99)
Potassium: 4.5 mmol/L (ref 3.5–5.1)
Sodium: 133 mmol/L — ABNORMAL LOW (ref 135–145)
Total Bilirubin: 0.5 mg/dL (ref 0.3–1.2)
Total Protein: 6.7 g/dL (ref 6.5–8.1)

## 2022-09-08 LAB — MAGNESIUM: Magnesium: 2.1 mg/dL (ref 1.7–2.4)

## 2022-09-08 LAB — PHOSPHORUS: Phosphorus: 3.5 mg/dL (ref 2.5–4.6)

## 2022-09-08 MED ORDER — ZINC CHLORIDE 1 MG/ML IV SOLN
INTRAVENOUS | Status: AC
  Filled 2022-09-08: qty 681.33

## 2022-09-08 NOTE — Progress Notes (Signed)
[facility] PROGRESS NOTE (cpt [phone])  Hospital Day(s): 207.   Post op day(s): 205 Days Post-Op.   Interval History:  There are some blood clots emptied out from the pouch overnight but no active bleeding.  Otherwise the patient denies any worsening pain or nausea.  Tolerating a soft diet.  Review of Systems:  Constitutional: denies fever, chills  HEENT: denies cough or congestion  Respiratory: denies any shortness of breath  Cardiovascular: denies chest pain or palpitations  Gastrointestinal: + Suprapubic pain, denies N/V Genitourinary: denies burning with urination or urinary frequency Integumentary: + midline wound (stable)   Vital signs in last 24 hours: [min-max] current  Temp:  [97.9 F (36.6 C)-98.2 F (36.8 C)] 98.1 F (36.7 C) (01/01 0747) Pulse Rate:  [76-106] 78 (01/01 0747) Resp:  [14-18] 14 (01/01 0747) BP: (98-110)/(50-62) 98/50 (01/01 0747) SpO2:  [99 %-100 %] 99 % (01/01 0747) Weight:  [97.2 kg] 97.2 kg (01/01 0500)     Height: '4\' 11"'$  (149.9 cm) Weight: 97.2 kg BMI (Calculated): 43.04   Intake/Output last 2 shifts:  12/31 0701 - 01/01 0700 In: 855.8 [P.O.:240; I.V.:615.8] Out: 750 [Drains:750]   Physical Exam:  Constitutional: alert, cooperative and no distress  Respiratory: breathing non-labored at rest  Cardiovascular: regular rate and sinus rhythm  Gastrointestinal: Soft, improved suprapubic tenderness (mild), non-distended, no rebound/guarding. Integumentary: Midline wound open, peritoneum closed; granulating; there are three stomatized areas visible in the LUQ and LLQ portions of the wound, output remains feculent. There is hypergranulation tissue to the inferior wound edge.  No active bleeding noted.    Labs:     Latest Ref Rng & Units 08/11/2022    4:23 AM 08/01/2022    4:29 AM 07/13/2022    7:05 AM  CBC  WBC 4.0 - 10.5 K/uL 4.8  5.7  4.5   Hemoglobin 12.0 - 15.0 g/dL 7.8  8.4  9.9   Hematocrit 36.0 - 46.0 % 24.1   25.7  31.1   Platelets 150 - 400 K/uL 138  137  115       Latest Ref Rng & Units 09/08/2022    8:18 AM 09/04/2022    8:01 AM 09/02/2022    8:23 AM  CMP  Glucose 70 - 99 mg/dL 163  180  186   BUN 6 - 20 mg/dL '27  25  21   '$ Creatinine 0.44 - 1.00 mg/dL 0.44  0.45  0.47   Sodium 135 - 145 mmol/L 133  133  134   Potassium 3.5 - 5.1 mmol/L 4.5  4.4  4.2   Chloride 98 - 111 mmol/L 107  106  108   CO2 22 - 32 mmol/L '22  21  19   '$ Calcium 8.9 - 10.3 mg/dL 8.3  8.6  8.4   Total Protein 6.5 - 8.1 g/dL 6.7  6.9    Total Bilirubin 0.3 - 1.2 mg/dL 0.5  0.3    Alkaline Phos 38 - 126 U/L 174  181    AST 15 - 41 U/L 48  38    ALT 0 - 44 U/L 47  32      Imaging studies: No new pertinent imaging studies this AM   Assessment/Plan:  57 y.o. 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   -  New 09/08/22: No acute changes today  - Wound Care; Changed 12/30; No longer on intermittent or continuous suction. Patient seems to like this better and has started to manage effluent independently. She will need intermittent suction with Yankauer to control effluent. Likely be beneficial to suction before ambulation.   - Continue TPN; cyclic; monitor nutritional labs bi-weekly - Appreciate dietary assistance with trace elements; iron supplementation            - Monitor abdominal wound for any further bleeding.  May need further silver nitrate therapy if other oozing; on-going bowel function            - Pain control prn; antemetic prn            - Progressed with therapies; no longer any recommendations  - Appreciate neurosurgery assistance with compression fracture; serial XRs   - Discharge Planning: Continue to anticipate lengthy admission +/- possible  transfer; no new progress regarding disposition unfortunately   All of the above findings and recommendations were discussed with the patient, and the medical team, and all of patient's questions were answered to her expressed satisfaction.  I spent 35 minutes dedicated to the care of this patient on the date of this encounter to include pre-visit review of records, face-to-face time with the patient discussing diagnosis and management, and any post-visit coordination of care.   Olean Ree, MD

## 2022-09-08 NOTE — Progress Notes (Signed)
PHARMACY - TOTAL PARENTERAL NUTRITION CONSULT NOTE   Indication: Prolonged ileus  Patient Measurements: Height: '4\' 11"'$  (149.9 cm) Weight: 97.2 kg (214 lb 4.6 oz) IBW/kg (Calculated) : 43.2 TPN AdjBW (KG): 55.2 Body mass index is 43.28 kg/m.  Assessment: Debra [name] is a 57 y.o. female s/p laparotomy, excision of greater omental mass, abdominal wall reconstruction with [name], appendectomy, and placement of Prevena vac.  Glucose / Insulin:  --No apparent history of diabetes --BG <180 mg/dL  --rSSI 4x/day + 20u insulin in TPN --SSI: 6u last 24h Electrolytes: Within normal limits Renal: SCr stable at approximate baseline  Hepatic: LFTs, bilirubin wnl GI Imaging: 9/11 CTAP: no new acute issues GI Surgeries / Procedures: s/p laparotomy, excision of greater omental mass, abdominal wall reconstruction with [name], appendectomy, and placement of Prevena vac  Central access: 02/15/22 TPN start date: 02/15/22  Nutritional Goals: Goal cyclic TPN over 16 hrs: cyclic TPN over 16 hrs: (provides 102 g of protein and 1817 kcals per day) total volume over 16hrs for calculations=1400 ml (1500 ml total with overfill)  RD Assessment:  Estimated Needs Total Energy Estimated Needs: 1800-2100kcal/day Total Protein Estimated Needs: 90-110g/d Total Fluid Estimated Needs: 1.4-1.6L/day  Current Nutrition:  Soft diet + nutritional supplements, not meeting PO needs yet   Plan: Transitioned to *Cyclic* TPN on 9/47.   to run over 16 hours per MD request. [name] at 2000 per discussion with Dietician to allow patient time for walking in afternoon/evenings.  To run over 16 hours: -Start rate at 49 mL/hr for 1 hour. -Increase rate to 98 mL/hr for 13 hours.  -Decrease rate to 49 mL/hr for 1 hour. -Decrease rate to 25 mL/hr for 1 hours, then stop.  Plan:  See cyclic TPN rate above Continue Nutritional Components Amino acids (using 15% Clinisol): 102 g Dextrose 19%  = 266 g Lipids (using 20% SMOFlipids): 50.4 g kCal: 1817/24h  Electrolytes in TPN: Na 47mq/L, K 4318m/L, Ca 18m26mL, Mg 10 mEq/L, Phos 10 mmol/L, Cl:Ac 1:1 Continue MVI, trace elements, chromium and zinc per dietary recs. Continue CBG/SSI for Cyclic TPN:  -CBG 2 hrs after cyclic TPN start -CBG during middle of cyclic TPN infusion -CBG 1 hr after cylic TPN stopped -CBG while off TPN Continue resistant SSI + 20u insulin in TPN . Seems pt may be refusing CBG checks.  Check TPN labs on Mon/Thurs at minimum Monitor for further need of diuresis and adjust as needed for fluid status zinc 5 mg added to TPN 12/16 per dietician recommendation.  MerNoralee SpaceharmD Clinical Pharmacist 09/08/2022 9:03 AM

## 2022-09-08 NOTE — Progress Notes (Signed)
Nursing emptied 450 ml of bloody drainage from eakin pouch, multiple clots observed, pt denies increased in pain at at this time, pt ambulated to BR x 1 assist, returned to bed, MD notified, no new orders at this time, continue to monitor

## 2022-09-09 LAB — GLUCOSE, CAPILLARY
Glucose-Capillary: 153 mg/dL — ABNORMAL HIGH (ref 70–99)
Glucose-Capillary: 127 mg/dL — ABNORMAL HIGH (ref 70–99)
Glucose-Capillary: 100 mg/dL — ABNORMAL HIGH (ref 70–99)
Glucose-Capillary: 71 mg/dL (ref 70–99)
Glucose-Capillary: 133 mg/dL — ABNORMAL HIGH (ref 70–99)
Glucose-Capillary: 142 mg/dL — ABNORMAL HIGH (ref 70–99)
Glucose-Capillary: 103 mg/dL — ABNORMAL HIGH (ref 70–99)
Glucose-Capillary: 158 mg/dL — ABNORMAL HIGH (ref 70–99)
Glucose-Capillary: 139 mg/dL — ABNORMAL HIGH (ref 70–99)
Glucose-Capillary: 169 mg/dL — ABNORMAL HIGH (ref 70–99)
Glucose-Capillary: 151 mg/dL — ABNORMAL HIGH (ref 70–99)
Glucose-Capillary: 114 mg/dL — ABNORMAL HIGH (ref 70–99)
Glucose-Capillary: 154 mg/dL — ABNORMAL HIGH (ref 70–99)

## 2022-09-09 MED ORDER — ZINC CHLORIDE 1 MG/ML IV SOLN
INTRAVENOUS | Status: AC
  Filled 2022-09-09: qty 681.33

## 2022-09-09 NOTE — Progress Notes (Signed)
PHARMACY - TOTAL PARENTERAL NUTRITION CONSULT NOTE   Indication: Prolonged ileus  Patient Measurements: Height: '4\' 11"'$  (149.9 cm) Weight: 95.6 kg (210 lb 12.2 oz) IBW/kg (Calculated) : 43.2 TPN AdjBW (KG): 55.2 Body mass index is 42.57 kg/m.  Assessment: Debra [name] is a 57 y.o. female s/p laparotomy, excision of greater omental mass, abdominal wall reconstruction with [name], appendectomy, and placement of Prevena vac.  Glucose / Insulin:  --No apparent history of diabetes --BG <180 mg/dL  --rSSI 4x/day + 20u insulin in TPN --SSI: 14u last 24h Electrolytes: Within normal limits Renal: SCr stable at approximate baseline  Hepatic: LFTs trending up slightly  --bilirubin wnl GI Imaging: 9/11 CTAP: no new acute issues GI Surgeries / Procedures: s/p laparotomy, excision of greater omental mass, abdominal wall reconstruction with [name], appendectomy, and placement of Prevena vac  Central access: 02/15/22 TPN start date: 02/15/22  Nutritional Goals: Goal cyclic TPN over 16 hrs: cyclic TPN over 16 hrs: (provides 102 g of protein and 1817 kcals per day) total volume over 16hrs for calculations=1400 ml (1500 ml total with overfill)  RD Assessment:  Estimated Needs Total Energy Estimated Needs: 1800-2100kcal/day Total Protein Estimated Needs: 90-110g/d Total Fluid Estimated Needs: 1.4-1.6L/day  Current Nutrition:  Soft diet + nutritional supplements, not meeting PO needs yet   Plan: Transitioned to *Cyclic* TPN on 6/76.   to run over 16 hours per MD request. [name] at 2000 per discussion with Dietician to allow patient time for walking in afternoon/evenings.  To run over 16 hours: -Start rate at 49 mL/hr for 1 hour. -Increase rate to 98 mL/hr for 13 hours.  -Decrease rate to 49 mL/hr for 1 hour. -Decrease rate to 25 mL/hr for 1 hours, then stop.  Plan:  See cyclic TPN rate above Continue Nutritional Components Amino acids (using 15%  Clinisol): 102 g Dextrose 19% = 266 g Lipids (using 20% SMOFlipids): 50.4 g kCal: 1817/24h  Electrolytes in TPN: Na 57mq/L, K 490m/L, Ca 102m41mL, Mg 10 mEq/L, Phos 10 mmol/L, Cl:Ac 1:1 Continue MVI, trace elements, chromium and zinc per dietary recs. Continue CBG/SSI for Cyclic TPN:  -CBG 2 hrs after cyclic TPN start -CBG during middle of cyclic TPN infusion -CBG 1 hr after cylic TPN stopped -CBG while off TPN Continue resistant SSI + 20u insulin in TPN . Seems pt may be refusing CBG checks.  Check TPN labs on Mon/Thurs at minimum Monitor for further need of diuresis and adjust as needed for fluid status zinc 5 mg added to TPN 12/16 per dietician recommendation.  [name] Clinical Pharmacist 09/09/2022 7:04 AM

## 2022-09-09 NOTE — TOC Progression Note (Signed)
Transition of Care [facility]) - Progression Note    Patient Details  Name: Debra [name] MRN: [id number] Date of Birth: [birth year]  [facility]) CM/SW Contact  Beverly Sessions, RN Phone Number: 09/09/2022, 11:57 AM  Clinical Narrative:     No update for discharge planing. TOC supervisor aware   Expected Discharge Plan: [place] Barriers to Discharge: Continued Medical Work up  Expected Discharge Plan and Services                                               Social Determinants of Health (SDOH) Interventions SDOH Screenings   Food Insecurity: No Food Insecurity (05/20/2022)  Housing: Low Risk  (05/20/2022)  Transportation Needs: No Transportation Needs (05/20/2022)  Utilities: Not At Risk (05/20/2022)  Tobacco Use: Low Risk  (08/10/2022)    Readmission Risk Interventions     No data to display

## 2022-09-09 NOTE — Progress Notes (Signed)
[facility] SURGICAL PROGRESS NOTE (cpt [phone])  Hospital Day(s): 208.   Post op day(s): 206 Days Post-Op.   Interval History:  No acute events overnight.  Patient reports having some mild oozing from the wound bed again overnight but it was very minimal.  Denies any abdominal pain.  She has been eating a little better and wanted to get up to walk around her bed.  Review of Systems:  Constitutional: denies fever, chills  HEENT: denies cough or congestion  Respiratory: denies any shortness of breath  Cardiovascular: denies chest pain or palpitations  Gastrointestinal: + Suprapubic pain, denies N/V Genitourinary: denies burning with urination or urinary frequency Integumentary: + midline wound (stable)   Vital signs in last 24 hours: [min-max] current  Temp:  [98.2 F (36.8 C)-98.8 F (37.1 C)] 98.8 F (37.1 C) (01/02 0318) Pulse Rate:  [83-87] 87 (01/02 0318) Resp:  [16-20] 20 (01/02 0318) BP: (92-98)/(57) 98/57 (01/02 0318) SpO2:  [99 %-100 %] 99 % (01/02 0318) Weight:  [95.6 kg] 95.6 kg (01/02 0500)     Height: '4\' 11"'$  (149.9 cm) Weight: 95.6 kg BMI (Calculated): 43.04   Intake/Output last 2 shifts:  01/01 0701 - 01/02 0700 In: 846.1 [I.V.:846.1] Out: 500 [Drains:500]   Physical Exam:  Constitutional: alert, cooperative and no distress  Respiratory: breathing non-labored at rest  Cardiovascular: regular rate and sinus rhythm  Gastrointestinal: Soft, mild suprapubic tenderness, non-distended, no rebound/guarding. Integumentary: Midline wound open, peritoneum closed; granulating; there are three stomatized areas visible in the LUQ and LLQ portions of the wound, output remains feculent. There is hypergranulation tissue to the inferior wound edge.  No active bleeding noted at this time.    Labs:     Latest Ref Rng & Units 08/11/2022    4:23 AM 08/01/2022    4:29 AM 07/13/2022    7:05 AM  CBC  WBC 4.0 - 10.5 K/uL 4.8  5.7  4.5   Hemoglobin 12.0 - 15.0  g/dL 7.8  8.4  9.9   Hematocrit 36.0 - 46.0 % 24.1  25.7  31.1   Platelets 150 - 400 K/uL 138  137  115       Latest Ref Rng & Units 09/08/2022    8:18 AM 09/04/2022    8:01 AM 09/02/2022    8:23 AM  CMP  Glucose 70 - 99 mg/dL 163  180  186   BUN 6 - 20 mg/dL '27  25  21   '$ Creatinine 0.44 - 1.00 mg/dL 0.44  0.45  0.47   Sodium 135 - 145 mmol/L 133  133  134   Potassium 3.5 - 5.1 mmol/L 4.5  4.4  4.2   Chloride 98 - 111 mmol/L 107  106  108   CO2 22 - 32 mmol/L '22  21  19   '$ Calcium 8.9 - 10.3 mg/dL 8.3  8.6  8.4   Total Protein 6.5 - 8.1 g/dL 6.7  6.9    Total Bilirubin 0.3 - 1.2 mg/dL 0.5  0.3    Alkaline Phos 38 - 126 U/L 174  181    AST 15 - 41 U/L 48  38    ALT 0 - 44 U/L 47  32      Imaging studies: No new pertinent imaging studies this AM   Assessment/Plan:  57 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   - New 09/09/22: No acute changes today  - Wound Care; Changed 12/30; No longer on intermittent or continuous suction. Patient seems to like this better and has started to manage effluent independently. She will need intermittent suction with Yankauer to control effluent. Likely be beneficial to suction before ambulation.   - Continue TPN; cyclic; monitor nutritional labs bi-weekly - Appreciate dietary assistance with trace elements; iron supplementation            - Monitor abdominal wound for any further bleeding.  May need further silver nitrate therapy if further bleeding episodes            - Pain control prn; antemetic prn            - Progressed with therapies; no longer any recommendations  - Appreciate neurosurgery assistance with compression fracture; serial XRs   - Discharge Planning: Continue to  anticipate lengthy admission +/- possible transfer; no new progress regarding disposition unfortunately   All of the above findings and recommendations were discussed with the patient, and the medical team, and all of patient's questions were answered to her expressed satisfaction.  I spent 35 minutes dedicated to the care of this patient on the date of this encounter to include pre-visit review of records, face-to-face time with the patient discussing diagnosis and management, and any post-visit coordination of care.   Olean Ree, MD

## 2022-09-10 LAB — GLUCOSE, CAPILLARY
Glucose-Capillary: 177 mg/dL — ABNORMAL HIGH (ref 70–99)
Glucose-Capillary: 153 mg/dL — ABNORMAL HIGH (ref 70–99)
Glucose-Capillary: 132 mg/dL — ABNORMAL HIGH (ref 70–99)
Glucose-Capillary: 156 mg/dL — ABNORMAL HIGH (ref 70–99)
Glucose-Capillary: 101 mg/dL — ABNORMAL HIGH (ref 70–99)
Glucose-Capillary: 192 mg/dL — ABNORMAL HIGH (ref 70–99)

## 2022-09-10 MED ORDER — ZINC CHLORIDE 1 MG/ML IV SOLN
INTRAVENOUS | Status: AC
  Filled 2022-09-10: qty 681.33

## 2022-09-10 NOTE — Plan of Care (Signed)
  Problem: Clinical Measurements: Goal: Will remain free from infection Outcome: Progressing   Problem: Nutrition: Goal: Adequate nutrition will be maintained Outcome: Progressing   Problem: Activity: Goal: Risk for activity intolerance will decrease Outcome: Progressing   Problem: Nutrition: Goal: Adequate nutrition will be maintained Outcome: Progressing   Problem: Coping: Goal: Level of anxiety will decrease Outcome: Progressing   Problem: Pain Managment: Goal: General experience of comfort will improve Outcome: Progressing

## 2022-09-10 NOTE — Progress Notes (Signed)
PHARMACY - TOTAL PARENTERAL NUTRITION CONSULT NOTE   Indication: Prolonged ileus  Patient Measurements: Height: '4\' 11"'$  (149.9 cm) Weight: 96 kg (211 lb 10.3 oz) IBW/kg (Calculated) : 43.2 TPN AdjBW (KG): 55.2 Body mass index is 42.75 kg/m.  Assessment: Debra [name] is a 58 y.o. female s/p laparotomy, excision of greater omental mass, abdominal wall reconstruction with Debra [name] release, appendectomy, and placement of Prevena vac.  Glucose / Insulin:  --No apparent history of diabetes --BG <180 mg/dL  --rSSI 4x/day + 20u insulin in TPN --SSI: 12u last 24h Electrolytes: Within normal limits Renal: SCr stable at approximate baseline  Hepatic: LFTs trending up slightly  --bilirubin wnl GI Imaging: 9/11 CTAP: no new acute issues GI Surgeries / Procedures: s/p laparotomy, excision of greater omental mass, abdominal wall reconstruction with Debra [name] release, appendectomy, and placement of Prevena vac  Central access: 02/15/22 TPN start date: 02/15/22  Nutritional Goals: Goal cyclic TPN over 16 hrs: cyclic TPN over 16 hrs: (provides 102 g of protein and 1817 kcals per day) total volume over 16hrs for calculations=1400 ml (1500 ml total with overfill)  RD Assessment:  Estimated Needs Total Energy Estimated Needs: 1800-2100kcal/day Total Protein Estimated Needs: 90-110g/d Total Fluid Estimated Needs: 1.4-1.6L/day  Current Nutrition:  Soft diet + nutritional supplements, not meeting PO needs yet   Plan: Transitioned to *Cyclic* TPN on 6/46.   to run over 16 hours per MD request. Debra [name] at 2000 per discussion with Dietician to allow patient time for walking in afternoon/evenings.  To run over 16 hours: -Start rate at 49 mL/hr for 1 hour. -Increase rate to 98 mL/hr for 13 hours.  -Decrease rate to 49 mL/hr for 1 hour. -Decrease rate to 25 mL/hr for 1 hours, then stop.  Plan:  See cyclic TPN rate above Continue Nutritional Components Amino acids (using 15%  Clinisol): 102 g Dextrose 19% = 266 g Lipids (using 20% SMOFlipids): 50.4 g kCal: 1817/24h  Electrolytes in TPN: Na 78mq/L, K 447m/L, Ca 37m337mL, Mg 10 mEq/L, Phos 10 mmol/L, Cl:Ac 1:1 Continue MVI, trace elements, chromium and zinc per dietary recs. Continue CBG/SSI for Cyclic TPN:  -CBG 2 hrs after cyclic TPN start -CBG during middle of cyclic TPN infusion -CBG 1 hr after cylic TPN stopped -CBG while off TPN Continue resistant SSI + 20u insulin in TPN . Seems pt may be refusing CBG checks.  Check TPN labs on Mon/Thurs at minimum Monitor for further need of diuresis and adjust as needed for fluid status zinc 5 mg added to TPN 12/16 per dietician recommendation.  Debra [name] Clinical Pharmacist 09/10/2022 7:24 AM

## 2022-09-10 NOTE — Progress Notes (Signed)
Patient seen and examined.  No new issues. Some intermittent oozing from fistulous. DeMent soft [name] pouch without leaks.  No active bleeding  A/p ec fistula  Continue wound care and TPn Check labs in am

## 2022-09-10 NOTE — Progress Notes (Signed)
Nutrition Follow-up  DOCUMENTATION CODES:   Obesity unspecified  INTERVENTION:   Continue cyclic TPN per pharmacy (16 hrs)- provides 1817kcal/day and 102g/day protein   Ensure Enlive po TID, each supplement provides 350 kcal and 20 grams of protein.  MVI, chromium and trace elements in TPN   Daily weights   Vitamin D2- 50,000 units po weekly   Zinc 20m daily added to TPN   Check vitamin D, iodine and zinc 1/11  NUTRITION DIAGNOSIS:   Increased nutrient needs related to wound healing, catabolic illness as evidenced by estimated needs.  GOAL:   Patient will meet greater than or equal to 90% of their needs -met with TPN   MONITOR:   PO intake, Supplement acceptance, Labs, Weight trends, Diet advancement, I & O's, TPN  ASSESSMENT:   57y/o female with h/o hypothyroidism, COVID 19 (3/21), kidney stones and stage 3 colon cancer (s/p left hemicolectomy 5/21 and chemotherapy) who is admitted for new pelvic mass now s/p laparotomy 6/8 (with excision of pelvic mass from greater omentum, abdominal wall reconstruction with bilateral myocutaneous flaps and mesh, incisional hernia repair, appendectomy repair and VAC placement) complicated by bowel perforation s/p reopening of recent laparotomy 6/10 (with repair of small bowel perforation, excision of mesh, placement of two phasix mesh and VAC placement). Pathology returned as metastatic adenocarcinoma. Pt with L1 compression fracture.   Pt continues to tolerate TPN well at goal rate; TPN is currently being cycled for 16 hours. Triglycerides wnl and are being checked monthly. Hyperglycemia improved with insulin in TPN. Volume status improved; pt remains up ~14lbs from admission. Vitamin labs improved; will continue to monitor zinc, vitamin D and Iodine levels monthly. Will monitor vitamin labs every other month. Pt continues to have poor oral intake. [name] pouch remains in place with 13232moutput. No discharge plan in place.   Medications  reviewed and include: D2, insulin, synthroid, protonix, carafate  -Selenium 93(L), Iodine 176.8(H), Chromium 2.3(H), copper 118 wnl, zinc 61 wnl, Manganese 18.6(H) - 12/7 -Iron 32.0 wnl, TIBC 330, ferritin 54 wnl, folate 14.4 wnl, B12 769- 12/7 -Vitamin B1- 91.2, vitamin D- 47.88, vitamin A- 21.5, B6- 22.8 wnl, vitamin C- 0.5, vitamin E- 12.7 wnl, vitamin K 0.82- 12/7   Labs reviewed: Na 133(L), K 4.5 wnl, BUN 27(H), P 3.5 wnl, Mg 2.1 wnl- 1/1 Triglycerides- 83-12/15 Cbgs- 153, 177 x 24 hrs  Diet Order:    Diet Order             DIET SOFT Room service appropriate? Yes; Fluid consistency: Thin  Diet effective now                  EDUCATION NEEDS:   Not appropriate for education at this time  Skin:  Skin Assessment: Reviewed RN Assessment ( Midline wound: 11cm x 12cm )  Last BM:  12/29  Height:   Ht Readings from Last 1 Encounters:  03/01/22 _0  (1.499 m)    Weight:   Wt Readings from Last 1 Encounters:  09/10/22 96 kg    Ideal Body Weight:  44.3 kg  BMI:  Body mass index is 42.75 kg/m.  Estimated Nutritional Needs:   Kcal:  1800-2100kcal/day  Protein:  90-110g/d  Fluid:  1.4-1.6L/day  CaKoleen DistanceS, RD, LDN Please refer to [facility] and/or RD on-call/weekend/after hours pager

## 2022-09-11 LAB — COMPREHENSIVE METABOLIC PANEL
ALT: 46 U/L — ABNORMAL HIGH (ref 0–44)
AST: 46 U/L — ABNORMAL HIGH (ref 15–41)
Albumin: 2.7 g/dL — ABNORMAL LOW (ref 3.5–5.0)
Alkaline Phosphatase: 189 U/L — ABNORMAL HIGH (ref 38–126)
Anion gap: 6 (ref 5–15)
BUN: 27 mg/dL — ABNORMAL HIGH (ref 6–20)
CO2: 21 mmol/L — ABNORMAL LOW (ref 22–32)
Calcium: 8.2 mg/dL — ABNORMAL LOW (ref 8.9–10.3)
Chloride: 103 mmol/L (ref 98–111)
Creatinine, Ser: 0.54 mg/dL (ref 0.44–1.00)
GFR, Estimated: >60 mL/min (ref >60)
Glucose, Bld: 183 mg/dL — ABNORMAL HIGH (ref 70–99)
Potassium: 4.5 mmol/L (ref 3.5–5.1)
Sodium: 130 mmol/L — ABNORMAL LOW (ref 135–145)
Total Bilirubin: 0.7 mg/dL (ref 0.3–1.2)
Total Protein: 7 g/dL (ref 6.5–8.1)

## 2022-09-11 LAB — GLUCOSE, CAPILLARY
Glucose-Capillary: 128 mg/dL — ABNORMAL HIGH (ref 70–99)
Glucose-Capillary: 178 mg/dL — ABNORMAL HIGH (ref 70–99)
Glucose-Capillary: 120 mg/dL — ABNORMAL HIGH (ref 70–99)
Glucose-Capillary: 100 mg/dL — ABNORMAL HIGH (ref 70–99)

## 2022-09-11 LAB — CBC
HCT: 26.3 % — ABNORMAL LOW (ref 36.0–46.0)
Hemoglobin: 8.2 g/dL — ABNORMAL LOW (ref 12.0–15.0)
MCH: 28.1 pg (ref 26.0–34.0)
MCHC: 31.2 g/dL (ref 30.0–36.0)
MCV: 90.1 fL (ref 80.0–100.0)
Platelets: 136 10*3/uL — ABNORMAL LOW (ref 150–400)
RBC: 2.92 MIL/uL — ABNORMAL LOW (ref 3.87–5.11)
RDW: 18.2 % — ABNORMAL HIGH (ref 11.5–15.5)
WBC: 6.3 10*3/uL (ref 4.0–10.5)
nRBC: 0 % (ref 0.0–0.2)

## 2022-09-11 LAB — MAGNESIUM: Magnesium: 2.1 mg/dL (ref 1.7–2.4)

## 2022-09-11 LAB — PHOSPHORUS: Phosphorus: 2.6 mg/dL (ref 2.5–4.6)

## 2022-09-11 MED ORDER — IBUPROFEN 400 MG PO TABS
800.0000 mg | ORAL_TABLET | Freq: Three times a day (TID) | ORAL | Status: DC | PRN
  Administered 2022-09-11 – 2022-12-18 (×5): 800 mg via ORAL
  Filled 2022-09-11 (×7): qty 2

## 2022-09-11 MED ORDER — IBUPROFEN 400 MG PO TABS: 800.0000 mg | ORAL_TABLET | Freq: Three times a day (TID) | ORAL | Status: DC

## 2022-09-11 MED ORDER — ALBUMIN HUMAN 25 % IV SOLN
25.0000 g | Freq: Once | INTRAVENOUS | Status: AC
  Administered 2022-09-11: 25 g via INTRAVENOUS
  Filled 2022-09-11: qty 100

## 2022-09-11 MED ORDER — ZINC CHLORIDE 1 MG/ML IV SOLN
INTRAVENOUS | Status: AC
  Filled 2022-09-11: qty 681.33

## 2022-09-11 NOTE — Progress Notes (Signed)
PHARMACY - TOTAL PARENTERAL NUTRITION CONSULT NOTE   Indication: Prolonged ileus  Patient Measurements: Height: '4\' 11"'$  (149.9 cm) Weight: 96 kg (211 lb 10.3 oz) IBW/kg (Calculated) : 43.2 TPN AdjBW (KG): 55.2 Body mass index is 42.75 kg/m.  Assessment: Debra [name] is a 56 y.o. female s/p laparotomy, excision of greater omental mass, abdominal wall reconstruction with [name], appendectomy, and placement of Prevena vac.  Glucose / Insulin:  --No apparent history of diabetes --BG 101-192 mg/dL  --rSSI 4x/day + 20u insulin in TPN --SSI: 8u last 24h Electrolytes: WNL except Na 130 Renal: SCr stable at approximate baseline  Hepatic: LFTs trending up slightly  --bilirubin wnl GI Imaging: 9/11 CTAP: no new acute issues GI Surgeries / Procedures: s/p laparotomy, excision of greater omental mass, abdominal wall reconstruction with [name], appendectomy, and placement of Prevena vac  Central access: 02/15/22 TPN start date: 02/15/22  Nutritional Goals: Goal cyclic TPN over 16 hrs: cyclic TPN over 16 hrs: (provides 102 g of protein and 1817 kcals per day) total volume over 16hrs for calculations=1400 ml (1500 ml total with overfill)  RD Assessment:  Estimated Needs Total Energy Estimated Needs: 1800-2100kcal/day Total Protein Estimated Needs: 90-110g/d Total Fluid Estimated Needs: 1.4-1.6L/day  Current Nutrition:  Soft diet + nutritional supplements, not meeting PO needs yet   Plan: Transitioned to *Cyclic* TPN on 7/78.   to run over 16 hours per MD request. [name] at 2000 per discussion with Dietician to allow patient time for walking in afternoon/evenings.  To run over 16 hours: -Start rate at 49 mL/hr for 1 hour. -Increase rate to 98 mL/hr for 13 hours.  -Decrease rate to 49 mL/hr for 1 hour. -Decrease rate to 25 mL/hr for 1 hours, then stop.  Plan:  See cyclic TPN rate above Continue Nutritional Components Amino acids (using 15%  Clinisol): 102 g Dextrose 19% = 266 g Lipids (using 20% SMOFlipids): 50.4 g kCal: 1817/24h  Electrolytes in TPN: Na 42mq/L, K 426m/L, Ca 71m471mL, Mg 10 mEq/L, Phos 10 mmol/L, Cl:Ac 1:1 Na 130 today, unclear if due to fluid overload will continue to monitor Continue MVI, trace elements, chromium and zinc per dietary recs. Continue CBG/SSI for Cyclic TPN:  -CBG 2 hrs after cyclic TPN start -CBG during middle of cyclic TPN infusion -CBG 1 hr after cylic TPN stopped -CBG while off TPN Continue resistant SSI + 20u insulin in TPN . Seems pt may be refusing CBG checks.  Check TPN labs on Mon/Thurs at minimum Monitor for further need of diuresis and adjust as needed for fluid status zinc 5 mg added to TPN 12/16 per dietician recommendation.  HowLorin PicketharmD Clinical Pharmacist 09/11/2022 7:39 AM

## 2022-09-11 NOTE — Progress Notes (Signed)
Patient seen.  She was resting this morning.  She did have a low-grade temperature but CBC and CMP is reassuring.  Continues to have output from EC fistula.  For now we will continue current care.  May need to do formal workup if low-grade fever persists.

## 2022-09-12 ENCOUNTER — Encounter: Payer: Self-pay | Admitting: Surgery

## 2022-09-12 ENCOUNTER — Inpatient Hospital Stay: Payer: Medicaid Other

## 2022-09-12 LAB — GLUCOSE, CAPILLARY
Glucose-Capillary: 195 mg/dL — ABNORMAL HIGH (ref 70–99)
Glucose-Capillary: 129 mg/dL — ABNORMAL HIGH (ref 70–99)
Glucose-Capillary: 111 mg/dL — ABNORMAL HIGH (ref 70–99)
Glucose-Capillary: 175 mg/dL — ABNORMAL HIGH (ref 70–99)
Glucose-Capillary: 175 mg/dL — ABNORMAL HIGH (ref 70–99)

## 2022-09-12 MED ORDER — ZINC CHLORIDE 1 MG/ML IV SOLN
INTRAVENOUS | Status: AC
  Filled 2022-09-12: qty 681.33

## 2022-09-12 MED ORDER — IOHEXOL 9 MG/ML PO SOLN
500.0000 mL | ORAL | Status: AC
  Administered 2022-09-12 (×2): 500 mL via ORAL

## 2022-09-12 MED ORDER — ALBUMIN HUMAN 25 % IV SOLN
25.0000 g | Freq: Once | INTRAVENOUS | Status: AC
  Administered 2022-09-12: 25 g via INTRAVENOUS
  Filled 2022-09-12: qty 100

## 2022-09-12 MED ORDER — IOHEXOL 300 MG/ML  SOLN
100.0000 mL | Freq: Once | INTRAMUSCULAR | Status: AC | PRN
  Administered 2022-09-12: 100 mL via INTRAVENOUS

## 2022-09-12 NOTE — TOC Progression Note (Signed)
Transition of Care [facility]) - Progression Note    Patient Details  Name: Debra [name] MRN: [id number] Date of Birth: February 20, 1966  [facility]) CM/SW Contact  Beverly Sessions, RN Phone Number: 09/12/2022, 10:13 AM  Clinical Narrative:     Patient now listed with Medicaid on file.  Glenard Haring with registration emailed along with [facility] supervisor to determine if it is traditional Medicaid, or emergency Medicaid   Expected Discharge Plan: [place] Barriers to Discharge: Continued Medical Work up  Expected Discharge Plan and Services                                               Social Determinants of Health (SDOH) Interventions SDOH Screenings   Food Insecurity: No Food Insecurity (05/20/2022)  Housing: Low Risk  (05/20/2022)  Transportation Needs: No Transportation Needs (05/20/2022)  Utilities: Not At Risk (05/20/2022)  Tobacco Use: Low Risk  (08/10/2022)    Readmission Risk Interventions     No data to display

## 2022-09-12 NOTE — Progress Notes (Signed)
PHARMACY - TOTAL PARENTERAL NUTRITION CONSULT NOTE   Indication: Prolonged ileus  Patient Measurements: Height: '4\' 11"'$  (149.9 cm) Weight: 98 kg (216 lb 0.8 oz) IBW/kg (Calculated) : 43.2 TPN AdjBW (KG): 55.2 Body mass index is 43.64 kg/m.  Assessment: Debra [name] is a 57 y.o. female s/p laparotomy, excision of greater omental mass, abdominal wall reconstruction with [name], appendectomy, and placement of Prevena vac.  Glucose / Insulin:  --No apparent history of diabetes --BG 100-183 mg/dL  --rSSI 4x/day + 20u insulin in TPN --SSI: 0u last 24h Electrolytes: WNL except Na 130 Renal: SCr stable at approximate baseline  Hepatic: LFTs trending up slightly  --bilirubin wnl GI Imaging: 9/11 CTAP: no new acute issues GI Surgeries / Procedures: s/p laparotomy, excision of greater omental mass, abdominal wall reconstruction with [name], appendectomy, and placement of Prevena vac  Central access: 02/15/22 TPN start date: 02/15/22  Nutritional Goals: Goal cyclic TPN over 16 hrs: cyclic TPN over 16 hrs: (provides 102 g of protein and 1817 kcals per day) total volume over 16hrs for calculations=1400 ml (1500 ml total with overfill)  RD Assessment:  Estimated Needs Total Energy Estimated Needs: 1800-2100kcal/day Total Protein Estimated Needs: 90-110g/d Total Fluid Estimated Needs: 1.4-1.6L/day  Current Nutrition:  Soft diet + nutritional supplements, not meeting PO needs yet   Plan: Transitioned to *Cyclic* TPN on 4/03.   to run over 16 hours per MD request. [name] at 2000 per discussion with Dietician to allow patient time for walking in afternoon/evenings.  To run over 16 hours: -Start rate at 49 mL/hr for 1 hour. -Increase rate to 98 mL/hr for 13 hours.  -Decrease rate to 49 mL/hr for 1 hour. -Decrease rate to 25 mL/hr for 1 hours, then stop.  Plan:  See cyclic TPN rate above Continue Nutritional Components Amino acids (using 15%  Clinisol): 102 g Dextrose 19% = 266 g Lipids (using 20% SMOFlipids): 50.4 g kCal: 1817/24h  Electrolytes in TPN: Na 248mq/L, K 484m/L, Ca 48m80mL, Mg 10 mEq/L, Phos 10 mmol/L, Cl:Ac 1:1 Na 130 today, unclear if due to fluid overload will continue to monitor Continue MVI, trace elements, chromium and zinc per dietary recs. Continue CBG/SSI for Cyclic TPN:  -CBG 2 hrs after cyclic TPN start -CBG during middle of cyclic TPN infusion -CBG 1 hr after cylic TPN stopped -CBG while off TPN Continue resistant SSI + 20u insulin in TPN . Seems pt may be refusing CBG checks.  Check TPN labs on Mon/Thurs at minimum Monitor for further need of diuresis and adjust as needed for fluid status zinc 5 mg added to TPN 12/16 per dietician recommendation.  HowLorin PicketharmD Clinical Pharmacist 09/12/2022 7:41 AM

## 2022-09-12 NOTE — Progress Notes (Signed)
Seen and examined.  She does not know to good today.  She did have a low-grade temperature.  She did have some transient soft blood pressure that responded to albumin.  No other changes.  PE NAD, chronically ill Abd: soft, [name] pouch in place with no evidence of active bleeding.  No peritonitis some mild suprapubic tenderness  I Did perform a CT scan stat of the chest abdomen pelvis without any new findings no active intra-abdominal pathology or chest pathology.  A/ p EC fistula stable ups and downs and today is a bit down  No acute surgical needs Continue TPN and [name]

## 2022-09-12 NOTE — TOC Progression Note (Addendum)
Transition of Care [facility]) - Progression Note    Patient Details  Name: Debra [name] MRN: [id number] Date of Birth: 1965-10-02  [facility]) CM/SW Contact  Beverly Sessions, RN Phone Number: 09/12/2022, 1:24 PM  Clinical Narrative:     Per Rob with registration  "The patient only has Medicaid eligibility from 02/13/22 to 03/17/22.  " "The Medicaid is left on the account for billing upon the patient's discharge from the hospital.   The Medicaid coverage cannot be removed from the account. We will bill Medicaid for the covered dates of service"  Per Caryl Pina with [employer] "approved for a limited coverage of Emergency Services Medicaid for the date range listed. The Medical Records were submitted to the Medicaid office for review to determine the dates of eligibility based on being considered emergent per their policy. The dates approved were based on their review of the medical records."  Per Janett Billow with [employer] "Per review of this patient's chart- she was only approved for Emergency Medicaid up until she no longer met the Emergency medical condition guidelines set forth by [facility].  Unfortunately-she is not eligible for ongoing Medicaid due to her citizenship status.  "    Expected Discharge Plan: [place] Barriers to Discharge: Continued Medical Work up  Expected Discharge Plan and Services                                               Social Determinants of Health (SDOH) Interventions SDOH Screenings   Food Insecurity: No Food Insecurity (05/20/2022)  Housing: Low Risk  (05/20/2022)  Transportation Needs: No Transportation Needs (05/20/2022)  Utilities: Not At Risk (05/20/2022)  Tobacco Use: Low Risk  (09/12/2022)    Readmission Risk Interventions     No data to display

## 2022-09-13 LAB — CBC
HCT: 23.3 % — ABNORMAL LOW (ref 36.0–46.0)
Hemoglobin: 7.3 g/dL — ABNORMAL LOW (ref 12.0–15.0)
MCH: 28.1 pg (ref 26.0–34.0)
MCHC: 31.3 g/dL (ref 30.0–36.0)
MCV: 89.6 fL (ref 80.0–100.0)
Platelets: 89 10*3/uL — ABNORMAL LOW (ref 150–400)
RBC: 2.6 MIL/uL — ABNORMAL LOW (ref 3.87–5.11)
RDW: 18.1 % — ABNORMAL HIGH (ref 11.5–15.5)
WBC: 3.5 10*3/uL — ABNORMAL LOW (ref 4.0–10.5)
nRBC: 0 % (ref 0.0–0.2)

## 2022-09-13 LAB — GLUCOSE, CAPILLARY
Glucose-Capillary: 119 mg/dL — ABNORMAL HIGH (ref 70–99)
Glucose-Capillary: 181 mg/dL — ABNORMAL HIGH (ref 70–99)

## 2022-09-13 LAB — COMPREHENSIVE METABOLIC PANEL
ALT: 42 U/L (ref 0–44)
ALT: 44 U/L (ref 0–44)
AST: 56 U/L — ABNORMAL HIGH (ref 15–41)
AST: 51 U/L — ABNORMAL HIGH (ref 15–41)
Albumin: 3.1 g/dL — ABNORMAL LOW (ref 3.5–5.0)
Albumin: 3.3 g/dL — ABNORMAL LOW (ref 3.5–5.0)
Alkaline Phosphatase: 168 U/L — ABNORMAL HIGH (ref 38–126)
Alkaline Phosphatase: 175 U/L — ABNORMAL HIGH (ref 38–126)
Anion gap: 12 (ref 5–15)
Anion gap: 5 (ref 5–15)
BUN: 41 mg/dL — ABNORMAL HIGH (ref 6–20)
BUN: 39 mg/dL — ABNORMAL HIGH (ref 6–20)
CO2: 17 mmol/L — ABNORMAL LOW (ref 22–32)
CO2: 21 mmol/L — ABNORMAL LOW (ref 22–32)
Calcium: 9 mg/dL (ref 8.9–10.3)
Calcium: 8.8 mg/dL — ABNORMAL LOW (ref 8.9–10.3)
Chloride: 105 mmol/L (ref 98–111)
Chloride: 106 mmol/L (ref 98–111)
Creatinine, Ser: 0.7 mg/dL (ref 0.44–1.00)
Creatinine, Ser: 0.65 mg/dL (ref 0.44–1.00)
GFR, Estimated: >60 mL/min (ref >60)
GFR, Estimated: >60 mL/min (ref >60)
Glucose, Bld: 185 mg/dL — ABNORMAL HIGH (ref 70–99)
Glucose, Bld: 164 mg/dL — ABNORMAL HIGH (ref 70–99)
Potassium: 5.1 mmol/L (ref 3.5–5.1)
Potassium: 4 mmol/L (ref 3.5–5.1)
Sodium: 134 mmol/L — ABNORMAL LOW (ref 135–145)
Sodium: 132 mmol/L — ABNORMAL LOW (ref 135–145)
Total Bilirubin: 0.6 mg/dL (ref 0.3–1.2)
Total Bilirubin: 0.9 mg/dL (ref 0.3–1.2)
Total Protein: 7.4 g/dL (ref 6.5–8.1)
Total Protein: 7.5 g/dL (ref 6.5–8.1)

## 2022-09-13 MED ORDER — ZINC CHLORIDE 1 MG/ML IV SOLN
INTRAVENOUS | Status: AC
  Filled 2022-09-13: qty 681.33

## 2022-09-13 NOTE — Progress Notes (Signed)
Patient ID: Debra [name], female   DOB: 11-13-1965, 57 y.o.   MRN: [id number]     [facility] Day(s): 212.   Interval History: Patient seen and examined, no acute events or new complaints overnight. Patient reports feeling tired.  Patient denies any new pain.  Some discomfort in the abdominal area.  Patient has been having intermittent fever.  No fever this morning.  Due to the discomfort in the abdominal area patient had a CT scan yesterday negative for any new pathologies.  Vital signs in last 24 hours: [min-max] current  Temp:  [97.6 F (36.4 C)-101.3 F (38.5 C)] 98.2 F (36.8 C) (01/06 0805) Pulse Rate:  [65-107] 75 (01/06 0805) Resp:  [17-20] 17 (01/06 0805) BP: (77-102)/(53-60) 91/54 (01/06 0805) SpO2:  [95 %-100 %] 100 % (01/06 0805) Weight:  [94.2 kg] 94.2 kg (01/06 0351)     Height: '4\' 11"'$  (149.9 cm) Weight: 94.2 kg BMI (Calculated): 43.04   Physical Exam:  Constitutional: alert, cooperative and no distress  Respiratory: breathing non-labored at rest  Cardiovascular: regular rate and sinus rhythm  Gastrointestinal: soft, non-tender, and non-distended.  Large open wound with enterocutaneous fistula.  [name] pouch in place.  Labs:     Latest Ref Rng & Units 09/11/2022    8:56 AM 08/11/2022    4:23 AM 08/01/2022    4:29 AM  CBC  WBC 4.0 - 10.5 K/uL 6.3  4.8  5.7   Hemoglobin 12.0 - 15.0 g/dL 8.2  7.8  8.4   Hematocrit 36.0 - 46.0 % 26.3  24.1  25.7   Platelets 150 - 400 K/uL 136  138  137       Latest Ref Rng & Units 09/11/2022    8:56 AM 09/08/2022    8:18 AM 09/04/2022    8:01 AM  CMP  Glucose 70 - 99 mg/dL 183  163  180   BUN 6 - 20 mg/dL '27  27  25   '$ Creatinine 0.44 - 1.00 mg/dL 0.54  0.44  0.45   Sodium 135 - 145 mmol/L 130  133  133   Potassium 3.5 - 5.1 mmol/L 4.5  4.5  4.4   Chloride 98 - 111 mmol/L 103  107  106   CO2 22 - 32 mmol/L '21  22  21   '$ Calcium 8.9 - 10.3 mg/dL 8.2  8.3  8.6   Total Protein 6.5 - 8.1 g/dL 7.0  6.7  6.9    Total Bilirubin 0.3 - 1.2 mg/dL 0.7  0.5  0.3   Alkaline Phos 38 - 126 U/L 189  174  181   AST 15 - 41 U/L 46  48  38   ALT 0 - 44 U/L 46  47  32     Imaging studies: No new pertinent imaging studies   Assessment/Plan:  57 y.o. female with metastatic colon cancer s/p omentectomy and incisional hernia repair 212 Days Post-Op, complicated by pertinent comorbidities including enterocutaneous fistula.     -Patient is having intermittent fever.  No fevers morning -White blood cell count within normal limits -CT scan negative for intra-abdominal infection -Possible etiology of fever could be catheter related.  Blood cultures sent.  Will follow-up blood culture -Encouraged patient to ambulate -Will continue with TPN and current diet  [name], MD

## 2022-09-13 NOTE — Progress Notes (Signed)
PHARMACY - TOTAL PARENTERAL NUTRITION CONSULT NOTE   Indication: Prolonged ileus  Patient Measurements: Height: '4\' 11"'$  (149.9 cm) Weight: 94.2 kg (207 lb 10.8 oz) IBW/kg (Calculated) : 43.2 TPN AdjBW (KG): 55.2 Body mass index is 41.94 kg/m.  Assessment: Debra [name] is a 57 y.o. female s/p laparotomy, excision of greater omental mass, abdominal wall reconstruction with [name], appendectomy, and placement of Prevena vac.  Glucose / Insulin:  --No apparent history of diabetes --BG 175-111 mg/dL  none for today listed 1/6 --rSSI 4x/day + 20u insulin in TPN --SSI: 12u last 24h Electrolytes: WNL except Na 130 Renal: SCr stable at approximate baseline  Hepatic: LFTs trending up slightly  --bilirubin wnl GI Imaging: 9/11 CTAP: no new acute issues GI Surgeries / Procedures: s/p laparotomy, excision of greater omental mass, abdominal wall reconstruction with [name], appendectomy, and placement of Prevena vac  Central access: 02/15/22 TPN start date: 02/15/22  Nutritional Goals: Goal cyclic TPN over 16 hrs: cyclic TPN over 16 hrs: (provides 102 g of protein and 1817 kcals per day) total volume over 16hrs for calculations=1400 ml (1500 ml total with overfill)  RD Assessment:  Estimated Needs Total Energy Estimated Needs: 1800-2100kcal/day Total Protein Estimated Needs: 90-110g/d Total Fluid Estimated Needs: 1.4-1.6L/day  Current Nutrition:  Soft diet + nutritional supplements, not meeting PO needs yet   Plan: Transitioned to *Cyclic* TPN on 8/14.   to run over 16 hours per MD request. [name] at 2000 per discussion with Dietician to allow patient time for walking in afternoon/evenings.  To run over 16 hours: -Start rate at 49 mL/hr for 1 hour. -Increase rate to 98 mL/hr for 13 hours.  -Decrease rate to 49 mL/hr for 1 hour. -Decrease rate to 25 mL/hr for 1 hours, then stop.  Plan:  See cyclic TPN rate above Continue Nutritional  Components Amino acids (using 15% Clinisol): 102 g Dextrose 19% = 266 g Lipids (using 20% SMOFlipids): 50.4 g kCal: 1817/24h  Electrolytes in TPN: Na 664mq/L, K 466m/L, Ca 64m66mL, Mg 10 mEq/L, Phos 10 mmol/L, Cl:Ac 1:1 Continue MVI, trace elements, chromium and zinc per dietary recs. Continue CBG/SSI for Cyclic TPN:  -CBG 2 hrs after cyclic TPN start -CBG during middle of cyclic TPN infusion -CBG 1 hr after cylic TPN stopped -CBG while off TPN Continue resistant SSI + 20u insulin in TPN . Seems pt may be refusing CBG checks.  Check TPN labs on Mon/Thurs at minimum Monitor for further need of diuresis and adjust as needed for fluid status zinc 5 mg added to TPN 12/16 per dietician recommendation.  MerNoralee SpaceharmD Clinical Pharmacist 09/13/2022 9:59 AM

## 2022-09-13 NOTE — Progress Notes (Signed)
Patient complaining of being very cold. Patient observed shivering. Temp orally 98.6. Adjusted temperature in room from 74 to 76. Added warm blanket to patient. RN noticed cooler air coming from inside room. RN placed work order to verify the room heat is properly. Temperature currently adjusted to 76.   Fuller Mandril, RN

## 2022-09-13 NOTE — Progress Notes (Signed)
   09/13/22 1927  Assess: MEWS Score  Temp (!) 102 F (38.9 C)  BP (!) 103/49  MAP (mmHg) 66  Pulse Rate (!) 110  Resp 20  SpO2 97 %  O2 Device Room Air  Assess: MEWS Score  MEWS Temp 2  MEWS Systolic 0  MEWS Pulse 1  MEWS RR 0  MEWS LOC 0  MEWS Score 3  MEWS Score Color Yellow  Assess: if the MEWS score is Yellow or Red  Were vital signs taken at a resting state? Yes  Focused Assessment Change from prior assessment (see assessment flowsheet)  Does the patient meet 2 or more of the SIRS criteria? Yes  Does the patient have a confirmed or suspected source of infection? No  Provider and Rapid Response Notified? Yes  MEWS guidelines implemented *See Row Information* Yes  Treat  MEWS Interventions Administered prn meds/treatments  Take Vital Signs  Increase Vital Sign Frequency  Yellow: Q 2hr X 2 then Q 4hr X 2, if remains yellow, continue Q 4hrs  Escalate  MEWS: Escalate Yellow: discuss with charge nurse/RN and consider discussing with provider and RRT  Notify: Charge Nurse/RN  Name of Charge Nurse/RN Notified Mendel Ryder RN  Date Charge Nurse/RN Notified 09/13/21  Time Charge Nurse/RN Notified 2021  Provider Notification  Provider Name/Title Dr Peyton Najjar  Date Provider Notified 09/13/22  Time Provider Notified 2007  Method of Notification Page  Notification Reason Change in status  Provider response See new orders  Date of Provider Response 09/13/22  Time of Provider Response 2010  Document  Patient Outcome Other (Comment) (PRN meds given pending recheck)  Progress note created (see row info) Yes  Assess: SIRS CRITERIA  SIRS Temperature  1  SIRS Pulse 1  SIRS Respirations  0  SIRS WBC 0  SIRS Score Sum  2

## 2022-09-13 NOTE — Plan of Care (Signed)
  Problem: Nutrition: Goal: Adequate nutrition will be maintained Outcome: Progressing   Problem: Activity: Goal: Risk for activity intolerance will decrease Outcome: Progressing   Problem: Coping: Goal: Level of anxiety will decrease Outcome: Progressing   Problem: Pain Managment: Goal: General experience of comfort will improve Outcome: Progressing

## 2022-09-13 NOTE — Progress Notes (Signed)
Per maintenance heat is working fine.   Fuller Mandril, RN

## 2022-09-13 NOTE — Progress Notes (Signed)
Patient complaining of nausea. Temperature orally 100. Zofran given. Patient wanted to hold off taking orals at the moment due to her nausea.   Fuller Mandril, RN

## 2022-09-14 LAB — GLUCOSE, CAPILLARY
Glucose-Capillary: 140 mg/dL — ABNORMAL HIGH (ref 70–99)
Glucose-Capillary: 184 mg/dL — ABNORMAL HIGH (ref 70–99)
Glucose-Capillary: 149 mg/dL — ABNORMAL HIGH (ref 70–99)

## 2022-09-14 LAB — URINALYSIS, ROUTINE W REFLEX MICROSCOPIC
Bilirubin Urine: NEGATIVE
Glucose, UA: NEGATIVE mg/dL
Hgb urine dipstick: NEGATIVE
Ketones, ur: NEGATIVE mg/dL
Leukocytes,Ua: NEGATIVE
Nitrite: NEGATIVE
Protein, ur: NEGATIVE mg/dL
Specific Gravity, Urine: 1.017 (ref 1.005–1.030)
pH: 5 (ref 5.0–8.0)

## 2022-09-14 MED ORDER — ZINC CHLORIDE 1 MG/ML IV SOLN
INTRAVENOUS | Status: DC
  Filled 2022-09-14: qty 681.33

## 2022-09-14 NOTE — Plan of Care (Signed)
  Problem: Clinical Measurements: Goal: Will remain free from infection Outcome: Progressing   Problem: Nutrition: Goal: Adequate nutrition will be maintained Outcome: Progressing   Problem: Activity: Goal: Risk for activity intolerance will decrease Outcome: Progressing   Problem: Nutrition: Goal: Adequate nutrition will be maintained Outcome: Progressing   Problem: Coping: Goal: Level of anxiety will decrease Outcome: Progressing   Problem: Pain Managment: Goal: General experience of comfort will improve Outcome: Progressing

## 2022-09-14 NOTE — Progress Notes (Signed)
Patient ID: Debra [name], female   DOB: [birth year], 57 y.o.   MRN: [id number]     [facility] Day(s): 213.   Interval History: Patient seen and examined, no acute events or new complaints overnight. Patient reports feeling tired.  Patient was sleeping comfortably.  She does endorse feeling tired today.  She has been having intermittent fever.  No fever this morning.  Highest temp yesterday was 102.  No nausea or vomiting.  She does endorse a discomfort in the lower abdomen.  Vital signs in last 24 hours: [min-max] current  Temp:  [97.5 F (36.4 C)-102 F (38.9 C)] 97.5 F (36.4 C) (01/07 0750) Pulse Rate:  [72-110] 72 (01/07 0750) Resp:  [17-20] 18 (01/07 0750) BP: (82-114)/(45-78) 82/45 (01/07 0750) SpO2:  [97 %-100 %] 100 % (01/07 0750) Weight:  [95 kg] 95 kg (01/07 0423)     Height: '4\' 11"'$  (149.9 cm) Weight: 95 kg BMI (Calculated): 43.04   Physical Exam:  Constitutional: alert, cooperative and no distress  Respiratory: breathing non-labored at rest  Cardiovascular: regular rate and sinus rhythm  Gastrointestinal: Large open midline wound.  Enterocutaneous fistula.  [name] pouch covering the wound.  Labs:     Latest Ref Rng & Units 09/13/2022    3:37 PM 09/11/2022    8:56 AM 08/11/2022    4:23 AM  CBC  WBC 4.0 - 10.5 K/uL 3.5  6.3  4.8   Hemoglobin 12.0 - 15.0 g/dL 7.3  8.2  7.8   Hematocrit 36.0 - 46.0 % 23.3  26.3  24.1   Platelets 150 - 400 K/uL 89  136  138       Latest Ref Rng & Units 09/13/2022    3:37 PM 09/13/2022    2:30 PM 09/11/2022    8:56 AM  CMP  Glucose 70 - 99 mg/dL 164  185  183   BUN 6 - 20 mg/dL 39  41  27   Creatinine 0.44 - 1.00 mg/dL 0.65  0.70  0.54   Sodium 135 - 145 mmol/L 132  134  130   Potassium 3.5 - 5.1 mmol/L 4.0  5.1  4.5   Chloride 98 - 111 mmol/L 106  105  103   CO2 22 - 32 mmol/L '21  17  21   '$ Calcium 8.9 - 10.3 mg/dL 8.8  9.0  8.2   Total Protein 6.5 - 8.1 g/dL 7.5  7.4  7.0   Total Bilirubin 0.3 - 1.2 mg/dL 0.9   0.6  0.7   Alkaline Phos 38 - 126 U/L 175  168  189   AST 15 - 41 U/L 51  56  46   ALT 0 - 44 U/L 44  42  46     Imaging studies: No new pertinent imaging studies   Assessment/Plan:  57 y.o. female with metastatic colon cancer s/p omentectomy and incisional hernia repair 213 Days Post-Op, complicated by pertinent comorbidities including enterocutaneous fistula.     -Patient continue having intermittent fever.  No fevers this morning treated with Tylenol -CT scan negative for intra-abdominal infection -CT scan of chest negative for pulmonary pathology -Pending blood and urine cultures. -Will continue with TPN and current diet  [name], MD

## 2022-09-14 NOTE — Progress Notes (Signed)
PHARMACY - TOTAL PARENTERAL NUTRITION CONSULT NOTE   Indication: Prolonged ileus  Patient Measurements: Height: '4\' 11"'$  (149.9 cm) Weight: 95 kg (209 lb 7 oz) IBW/kg (Calculated) : 43.2 TPN AdjBW (KG): 55.2 Body mass index is 42.3 kg/m.  Assessment: Debra [name] is a 57 y.o. female s/p laparotomy, excision of greater omental mass, abdominal wall reconstruction with [name], appendectomy, and placement of Prevena vac.  Glucose / Insulin:  --No apparent history of diabetes --BG 119-185 mg/dL   --rSSI 4x/day + 20u insulin in TPN --SSI: 15u last 24h Electrolytes: Na 132, Ca 8.8 (alb 3.3) Renal: SCr stable at approximate baseline  Hepatic: LFTs trending up slightly  --bilirubin wnl GI Imaging: 9/11 CTAP: no new acute issues GI Surgeries / Procedures: s/p laparotomy, excision of greater omental mass, abdominal wall reconstruction with [name], appendectomy, and placement of Prevena vac  Central access: 02/15/22 TPN start date: 02/15/22  Nutritional Goals: Goal cyclic TPN over 16 hrs: cyclic TPN over 16 hrs: (provides 102 g of protein and 1817 kcals per day) total volume over 16hrs for calculations=1400 ml (1500 ml total with overfill)  RD Assessment:  Estimated Needs Total Energy Estimated Needs: 1800-2100kcal/day Total Protein Estimated Needs: 90-110g/d Total Fluid Estimated Needs: 1.4-1.6L/day  Current Nutrition:  Soft diet + nutritional supplements, not meeting PO needs yet   Plan: Transitioned to *Cyclic* TPN on 1/60.   to run over 16 hours per MD request. [name] at 2000 per discussion with Dietician to allow patient time for walking in afternoon/evenings.  To run over 16 hours: -Start rate at 49 mL/hr for 1 hour. -Increase rate to 98 mL/hr for 13 hours.  -Decrease rate to 49 mL/hr for 1 hour. -Decrease rate to 25 mL/hr for 1 hour, then stop.  Plan:  See cyclic TPN rate above Continue Nutritional Components Amino acids (using 15%  Clinisol): 102 g Dextrose 19% = 266 g Lipids (using 20% SMOFlipids): 50.4 g kCal: 1817/24h  Electrolytes in TPN: Na 16mq/L, K 4231m/L, Ca 31m27mL, Mg 10 mEq/L, Phos 10 mmol/L, Cl:Ac 1:1 Continue MVI, trace elements, chromium and zinc per dietary recs. Continue CBG/SSI for Cyclic TPN:  -CBG 2 hrs after cyclic TPN start -CBG during middle of cyclic TPN infusion -CBG 1 hr after cylic TPN stopped -CBG while off TPN Continue resistant SSI + 20u insulin in TPN . Seems pt may be refusing CBG checks.  Check TPN labs on Mon/Thurs at minimum Monitor for further need of diuresis and adjust as needed for fluid status zinc 5 mg added to TPN 12/16 per dietician recommendation.  MerNoralee SpaceharmD Clinical Pharmacist 09/14/2022 9:02 AM

## 2022-09-14 NOTE — Progress Notes (Signed)
Pt c/o being cold. Pt shivering. Oral temp 98.0. CBG 149. Denies pain. Warm blankets placed on patient.

## 2022-09-15 DIAGNOSIS — Z452 Encounter for adjustment and management of vascular access device: Secondary | ICD-10-CM

## 2022-09-15 LAB — RESPIRATORY PANEL BY PCR

## 2022-09-15 LAB — URINE CULTURE: Culture: 30000 — AB

## 2022-09-15 LAB — COMPREHENSIVE METABOLIC PANEL
ALT: 45 U/L — ABNORMAL HIGH (ref 0–44)
AST: 58 U/L — ABNORMAL HIGH (ref 15–41)
Albumin: 2.6 g/dL — ABNORMAL LOW (ref 3.5–5.0)
Alkaline Phosphatase: 160 U/L — ABNORMAL HIGH (ref 38–126)
Anion gap: 7 (ref 5–15)
BUN: 38 mg/dL — ABNORMAL HIGH (ref 6–20)
CO2: 18 mmol/L — ABNORMAL LOW (ref 22–32)
Calcium: 8.6 mg/dL — ABNORMAL LOW (ref 8.9–10.3)
Chloride: 109 mmol/L (ref 98–111)
Creatinine, Ser: 0.56 mg/dL (ref 0.44–1.00)
GFR, Estimated: >60 mL/min (ref >60)
Glucose, Bld: 196 mg/dL — ABNORMAL HIGH (ref 70–99)
Potassium: 4.6 mmol/L (ref 3.5–5.1)
Sodium: 134 mmol/L — ABNORMAL LOW (ref 135–145)
Total Bilirubin: 0.6 mg/dL (ref 0.3–1.2)
Total Protein: 6.5 g/dL (ref 6.5–8.1)

## 2022-09-15 LAB — GLUCOSE, CAPILLARY
Glucose-Capillary: 182 mg/dL — ABNORMAL HIGH (ref 70–99)
Glucose-Capillary: 158 mg/dL — ABNORMAL HIGH (ref 70–99)
Glucose-Capillary: 181 mg/dL — ABNORMAL HIGH (ref 70–99)
Glucose-Capillary: 174 mg/dL — ABNORMAL HIGH (ref 70–99)
Glucose-Capillary: 157 mg/dL — ABNORMAL HIGH (ref 70–99)
Glucose-Capillary: 167 mg/dL — ABNORMAL HIGH (ref 70–99)
Glucose-Capillary: 136 mg/dL — ABNORMAL HIGH (ref 70–99)
Glucose-Capillary: 110 mg/dL — ABNORMAL HIGH (ref 70–99)

## 2022-09-15 LAB — MAGNESIUM: Magnesium: 2.3 mg/dL (ref 1.7–2.4)

## 2022-09-15 LAB — PHOSPHORUS: Phosphorus: 3.4 mg/dL (ref 2.5–4.6)

## 2022-09-15 MED ORDER — VANCOMYCIN HCL 1250 MG/250ML IV SOLN
1250.0000 mg | Freq: Once | INTRAVENOUS | Status: AC
  Administered 2022-09-16: 1250 mg via INTRAVENOUS
  Filled 2022-09-15: qty 250

## 2022-09-15 MED ORDER — SODIUM CHLORIDE 0.9 % IV SOLN
2.0000 g | Freq: Three times a day (TID) | INTRAVENOUS | Status: DC
  Administered 2022-09-15 – 2022-09-18 (×9): 2 g via INTRAVENOUS
  Filled 2022-09-15: qty 2
  Filled 2022-09-15 (×3): qty 12.5
  Filled 2022-09-15: qty 2
  Filled 2022-09-15 (×5): qty 12.5

## 2022-09-15 MED ORDER — DEXTROSE-NACL 5-0.9 % IV SOLN: INTRAVENOUS | Status: AC

## 2022-09-15 MED ORDER — LIDOCAINE-EPINEPHRINE 1 %-1:100000 IJ SOLN
20.0000 mL | Freq: Once | INTRAMUSCULAR | Status: AC
  Administered 2022-09-15: 1 mL via INTRADERMAL
  Filled 2022-09-15 (×2): qty 20

## 2022-09-15 MED ORDER — VANCOMYCIN HCL 2000 MG/400ML IV SOLN
2000.0000 mg | Freq: Once | INTRAVENOUS | Status: AC
  Administered 2022-09-15: 2000 mg via INTRAVENOUS
  Filled 2022-09-15: qty 400

## 2022-09-15 NOTE — Progress Notes (Addendum)
[facility] SURGICAL PROGRESS NOTE (cpt [phone])  Hospital Day(s): 214.   Post op day(s): 212 Days Post-Op.   Interval History:  Patient seen and examined Febrile over the weekend; no fever last 24 hours Imaging reviewed without obvious source BCx reviewed and without growth Nutritional labs are reassuring; UA negative [name] output 550 ccs + unmeasured; she is starting to manage effluent independently She is on CLD currently On cyclic TPN  Review of Systems:  Constitutional: denies fever, chills  HEENT: denies cough or congestion  Respiratory: denies any shortness of breath  Cardiovascular: denies chest pain or palpitations  Gastrointestinal: + Abdominal pain, denies N/V Genitourinary: denies burning with urination or urinary frequency Integumentary: + midline wound (stable)   Vital signs in last 24 hours: [min-max] current  Temp:  [97.7 F (36.5 C)-98.6 F (37 C)] 98.1 F (36.7 C) (01/08 0748) Pulse Rate:  [69-94] 79 (01/08 0748) Resp:  [17-20] 17 (01/08 0748) BP: (91-108)/(48-74) 98/48 (01/08 0748) SpO2:  [99 %-100 %] 100 % (01/08 0748) Weight:  [97 kg] 97 kg (01/08 0436)     Height: '4\' 11"'$  (149.9 cm) Weight: 97 kg BMI (Calculated): 43.04   Intake/Output last 2 shifts:  01/07 0701 - 01/08 0700 In: 888.6 [P.O.:240; I.V.:648.6] Out: 550 [Drains:550]   Physical Exam:  Constitutional: alert, cooperative and no distress  Respiratory: breathing non-labored at rest  Cardiovascular: regular rate and sinus rhythm  Gastrointestinal: Soft, rather unchanged suprapubic tenderness (mild), non-distended, no rebound/guarding. Integumentary: Midline wound open, peritoneum closed; granulating; there are three stomatized areas visible in the LUQ and LLQ portions of the wound, output remains feculent. There is hypergranulation tissue to the inferior wound edge.     Labs:     Latest Ref Rng & Units 09/13/2022    3:37 PM 09/11/2022    8:56 AM 08/11/2022    4:23 AM  CBC   WBC 4.0 - 10.5 K/uL 3.5  6.3  4.8   Hemoglobin 12.0 - 15.0 g/dL 7.3  8.2  7.8   Hematocrit 36.0 - 46.0 % 23.3  26.3  24.1   Platelets 150 - 400 K/uL 89  136  138       Latest Ref Rng & Units 09/15/2022    8:32 AM 09/13/2022    3:37 PM 09/13/2022    2:30 PM  CMP  Glucose 70 - 99 mg/dL 196  164  185   BUN 6 - 20 mg/dL 38  39  41   Creatinine 0.44 - 1.00 mg/dL 0.56  0.65  0.70   Sodium 135 - 145 mmol/L 134  132  134   Potassium 3.5 - 5.1 mmol/L 4.6  4.0  5.1   Chloride 98 - 111 mmol/L 109  106  105   CO2 22 - 32 mmol/L '18  21  17   '$ Calcium 8.9 - 10.3 mg/dL 8.6  8.8  9.0   Total Protein 6.5 - 8.1 g/dL 6.5  7.5  7.4   Total Bilirubin 0.3 - 1.2 mg/dL 0.6  0.9  0.6   Alkaline Phos 38 - 126 U/L 160  175  168   AST 15 - 41 U/L 58  51  56   ALT 0 - 44 U/L 45  44  42     Imaging studies: No new pertinent imaging studies this AM   Assessment/Plan:  57 y.o. female with high output enterocutaneous fistula 212 Days Post-Op s/p re-opening of laparotomy for repair of small bowel perforation following initial laparotomy,  excision of greater omental mass, abdominal wall reconstruction with [name], appendectomy, and placement of Prevena vac on 06/08.   - New 01/08: Given fever over the weekend without obvious source, we will plan to remove port-a-catheter at bedside (document procedure separately). Will need IV; consult placed to IV team for this. Will start Vancomycin and Cefepime as well. Check COVID and influenza. Will get TPN holiday given lack of central access; started D5-NS at 50 ml/hr; monitor blood glucose.   - Wound Care ([name] Pouch); No longer on intermittent or continuous suction. Patient seems to like this better and has started to manage effluent independently. She will need intermittent suction with Yankauer to control effluent. Likely be beneficial to suction before ambulation.   - TPN held as off 01/08 given lack of central access.  - Appreciate dietary assistance with  trace elements; iron supplementation            - Monitor abdominal examination; on-going bowel function            - Pain control prn; antemetic prn            - Progressed with therapies; no longer any recommendations  - Appreciate neurosurgery assistance with compression fracture; serial XRs   - Discharge Planning: Continue to anticipate lengthy admission +/- possible transfer; no new progress regarding disposition unfortunately   All of the above findings and recommendations were discussed with the patient, and the medical team, and all of patient's questions were answered to her expressed satisfaction.  -- [name], PA-C [facility] 09/15/2022, 10:03 AM M-F: 7am - 4pm

## 2022-09-15 NOTE — Procedures (Signed)
Procedure Note  Date: 09/15/22 12:29 PM  Preforming Provider: [name], PA-C  Supervising Physician: Dr Caroleen Hamman, MD  Pre-Procedure Diagnosis: Fever; Unknown Source   Post-Procedure Diagnosis: Same  Anesthesia: 6 ccs of 1% lidocaine with epinephrine  Findings: Intact port-a-catheter  Details of Procedure:  All risks, benefits, and alternatives to above procedure(s) were discussed with the patient and informed consent was obtained. Right chest wall was prepped and draped in standard sterile fashion. 6 ccs of 1% lidocaine with epinephrine with injected intradermally and adequate anesthesia achieved. Using a 15 blade scalpel, an incision was made over the main body of the port. Using sharp and blunt dissection, the port-a-catheter was freed and all sutures cut. The port was then removed without complication. The wound was then irrigated with copious amount of NS and packed with 4x4 guaze. The patient tolerated this well without immediate complications. All sharps accounted for and disposed of properly.   Specimens: Port-a-catheter tip sent for Cx  Complications: None apparent  --  [name], PA-C [facility] 09/15/2022, 12:29 PM M-F: 7am - 4pm

## 2022-09-15 NOTE — Progress Notes (Signed)
Pharmacy Antibiotic Note  Debra [name] is a 57 y.o. female w/ PMH of colon CA, s/p sigmoid colectomy 2 years ago.  hypothyroid, kidney stones admitted on 02/13/2022 with high output enterocutaneous fistula.  She developed fever over the weekend without obvious source, plan to remove port-a-catheter & stopping TPN for now. Pharmacy has been consulted for vancomycin and cefepime dosing. Renal function is stable  Plan:  1) start cefepime 2 grams IV every 8 hours --follow renal function for needed dose adjustments  2) start vancomycin 2000 mg IV x 1 then 1250 mg every 24 hours  Goal AUC 400-550. Expected AUC: 539.3 SCr used: 0.80 mg/dL (rounded up) Ke: 0.048 h-1, T1/2 14.5 h  Height: '4\' 11"'$  (149.9 cm) Weight: 97 kg (213 lb 13.5 oz) IBW/kg (Calculated) : 43.2  Temp (24hrs), Avg:98.1 F (36.7 C), Min:97.7 F (36.5 C), Max:98.6 F (37 C)  Recent Labs  Lab 09/11/22 0856 09/13/22 1430 09/13/22 1537 09/15/22 0832  WBC 6.3  --  3.5*  --   CREATININE 0.54 0.70 0.65 0.56    Estimated Creatinine Clearance: 78.3 mL/min (by C-G formula based on SCr of 0.56 mg/dL).    No Known Allergies  recent antimicrobials this admission: 01/08 vancomycin >>  01/08 cefepime >>   Microbiology results: 01/05 BCx: NGTD 01/07 UCx: NG   Thank you for allowing pharmacy to be a part of this patient's care.  Dallie Piles 09/15/2022 10:20 AM

## 2022-09-15 NOTE — TOC Progression Note (Signed)
Transition of Care [facility]) - Progression Note    Patient Details  Name: Debra [name] MRN: [id number] Date of Birth: [birth year]  [facility] CM/SW Contact  Beverly Sessions, RN Phone Number: 09/15/2022, 10:47 AM  Clinical Narrative:    Per Surgery "no new progress regarding disposition unfortunately "   Expected Discharge Plan: [place] Barriers to Discharge: Continued Medical Work up  Expected Discharge Plan and Services                                               Social Determinants of Health (SDOH) Interventions SDOH Screenings   Food Insecurity: No Food Insecurity (05/20/2022)  Housing: Low Risk  (05/20/2022)  Transportation Needs: No Transportation Needs (05/20/2022)  Utilities: Not At Risk (05/20/2022)  Tobacco Use: Low Risk  (09/12/2022)    Readmission Risk Interventions     No data to display

## 2022-09-16 ENCOUNTER — Inpatient Hospital Stay: Payer: Self-pay

## 2022-09-16 LAB — GLUCOSE, CAPILLARY
Glucose-Capillary: 95 mg/dL (ref 70–99)
Glucose-Capillary: 121 mg/dL — ABNORMAL HIGH (ref 70–99)
Glucose-Capillary: 87 mg/dL (ref 70–99)
Glucose-Capillary: 92 mg/dL (ref 70–99)

## 2022-09-16 LAB — VITAMIN D 25 HYDROXY (VIT D DEFICIENCY, FRACTURES): Vit D, 25-Hydroxy: 49.04 ng/mL (ref 30–100)

## 2022-09-16 MED ORDER — VANCOMYCIN HCL 1250 MG/250ML IV SOLN
1250.0000 mg | INTRAVENOUS | Status: DC
  Administered 2022-09-17 – 2022-09-22 (×6): 1250 mg via INTRAVENOUS
  Filled 2022-09-16 (×6): qty 250

## 2022-09-16 MED ORDER — SODIUM CHLORIDE 0.9% FLUSH
10.0000 mL | INTRAVENOUS | Status: DC | PRN
  Administered 2022-10-30 – 2023-02-13 (×2): 10 mL

## 2022-09-16 MED ORDER — SODIUM CHLORIDE 0.9% FLUSH
10.0000 mL | Freq: Two times a day (BID) | INTRAVENOUS | Status: DC
  Administered 2022-09-16 – 2022-09-23 (×14): 10 mL
  Administered 2022-09-23: 20 mL
  Administered 2022-09-24 – 2022-10-07 (×27): 10 mL
  Administered 2022-10-08: 40 mL
  Administered 2022-10-09 (×2): 10 mL
  Administered 2022-10-10: 20 mL
  Administered 2022-10-10 – 2022-10-29 (×35): 10 mL
  Administered 2022-10-30: 20 mL
  Administered 2022-10-31 – 2022-11-02 (×6): 10 mL
  Administered 2022-11-03: 20 mL
  Administered 2022-11-03: 10 mL
  Administered 2022-11-04: 20 mL
  Administered 2022-11-04 – 2022-11-06 (×4): 10 mL
  Administered 2022-11-06: 20 mL
  Administered 2022-11-07 – 2022-11-10 (×8): 10 mL
  Administered 2022-11-11: 20 mL
  Administered 2022-11-11 – 2022-11-19 (×15): 10 mL
  Administered 2022-11-19: 20 mL
  Administered 2022-11-20 – 2022-11-29 (×19): 10 mL
  Administered 2022-11-29: 20 mL
  Administered 2022-11-30: 10 mL
  Administered 2022-11-30: 20 mL
  Administered 2022-12-01 – 2023-01-03 (×58): 10 mL
  Administered 2023-01-04: 40 mL
  Administered 2023-01-05 – 2023-01-31 (×47): 10 mL
  Administered 2023-02-01: 20 mL
  Administered 2023-02-01 – 2023-02-22 (×38): 10 mL
  Administered 2023-02-23: 20 mL
  Administered 2023-02-23 – 2023-03-05 (×18): 10 mL
  Administered 2023-03-06: 20 mL
  Administered 2023-03-06 – 2023-03-09 (×6): 10 mL
  Administered 2023-03-09: 20 mL
  Administered 2023-03-10 – 2023-03-17 (×16): 10 mL
  Administered 2023-03-18: 20 mL
  Administered 2023-03-18 – 2023-04-21 (×60): 10 mL
  Administered 2023-04-21: 20 mL
  Administered 2023-04-22 – 2023-04-23 (×2): 10 mL
  Administered 2023-04-23: 20 mL
  Administered 2023-04-24 (×2): 10 mL
  Administered 2023-04-25: 20 mL
  Administered 2023-04-25 – 2023-04-30 (×10): 10 mL
  Administered 2023-05-01: 30 mL
  Administered 2023-05-01: 20 mL
  Administered 2023-05-02: 10 mL
  Administered 2023-05-02 – 2023-05-03 (×2): 20 mL
  Administered 2023-05-03 – 2023-05-08 (×9): 10 mL
  Administered 2023-05-08: 20 mL
  Administered 2023-05-09 – 2023-05-13 (×10): 10 mL
  Administered 2023-05-14: 20 mL
  Administered 2023-05-14 – 2023-05-17 (×6): 10 mL

## 2022-09-16 NOTE — Progress Notes (Addendum)
Peripherally Inserted Central Catheter Placement  The IV Nurse has discussed with the patient and/or persons authorized to consent for the patient, the purpose of this procedure and the potential benefits and risks involved with this procedure.  The benefits include less needle sticks, lab draws from the catheter, and the patient may be discharged home with the catheter. Risks include, but not limited to, infection, bleeding, blood clot (thrombus formation), and puncture of an artery; nerve damage and irregular heartbeat and possibility to perform a PICC exchange if needed/ordered by physician.  Alternatives to this procedure were also discussed.  Bard Power PICC patient education guide, fact sheet on infection prevention and patient information card has been provided to patient /or left at bedside.    PICC Placement Documentation  PICC Double Lumen 67/34/19 Left Basilic 43 cm 0 cm (Active)  Indication for Insertion or Continuance of Line Administration of hyperosmolar/irritating solutions (i.e. TPN, Vancomycin, etc.) 09/16/22 1754  Exposed Catheter (cm) 0 cm 09/16/22 1754  Site Assessment Clean, Dry, Intact 09/16/22 1754  Lumen #1 Status Flushed;Blood return noted;Saline locked 09/16/22 1754  Lumen #2 Status Flushed;Blood return noted;Saline locked 09/16/22 1754  Dressing Type Transparent 09/16/22 1754  Dressing Status Antimicrobial disc in place 09/16/22 1754  Dressing Change Due 09/23/22 09/16/22 1754   Patient Left Upper is swollen before PICC insertion    Scotty Court 09/16/2022, 5:56 PM

## 2022-09-16 NOTE — Progress Notes (Signed)
[name] SURGICAL PROGRESS NOTE (cpt [phone])  Hospital Day(s): 215.   Post op day(s): 213 Days Post-Op.   Interval History:  Patient seen and examined No fever last 48 hours BCx reviewed and without growth Respiratory panel negative [name] output 950 ccs + unmeasured; she is starting to manage effluent independently She is on CLD currently TPN held given lack of central access On Cefepime and Vancomycin   Review of Systems:  Constitutional: denies fever, chills  HEENT: denies cough or congestion  Respiratory: denies any shortness of breath  Cardiovascular: denies chest pain or palpitations  Gastrointestinal: + Abdominal pain, denies N/V Genitourinary: denies burning with urination or urinary frequency Integumentary: + midline wound (stable)   Vital signs in last 24 hours: [min-max] current  Temp:  [97.9 F (36.6 C)-98.2 F (36.8 C)] 98 F (36.7 C) (01/09 0423) Pulse Rate:  [67-80] 80 (01/09 0423) Resp:  [16-18] 16 (01/09 0423) BP: (85-107)/(44-61) 107/61 (01/09 0423) SpO2:  [100 %] 100 % (01/09 0423) Weight:  [97 kg] 97 kg (01/09 0500)     Height: '4\' 11"'$  (149.9 cm) Weight: 97 kg BMI (Calculated): 43.04   Intake/Output last 2 shifts:  01/08 0701 - 01/09 0700 In: 2078.1 [P.O.:360; I.V.:1048.4; IV Piggyback:669.7] Out: 950 [Drains:950]   Physical Exam:  Constitutional: alert, cooperative and no distress  Respiratory: breathing non-labored at rest  Cardiovascular: regular rate and sinus rhythm  Gastrointestinal: Soft, rather unchanged suprapubic tenderness (mild), non-distended, no rebound/guarding. Integumentary:  Port removal site in right chest is healing via secondary intention; no erythema, packing in place   Midline wound open, peritoneum closed; granulating; there are three stomatized areas visible in the LUQ and LLQ portions of the wound, output remains feculent. There is hypergranulation tissue to the inferior wound edge.     Labs:      Latest Ref Rng & Units 09/13/2022    3:37 PM 09/11/2022    8:56 AM 08/11/2022    4:23 AM  CBC  WBC 4.0 - 10.5 K/uL 3.5  6.3  4.8   Hemoglobin 12.0 - 15.0 g/dL 7.3  8.2  7.8   Hematocrit 36.0 - 46.0 % 23.3  26.3  24.1   Platelets 150 - 400 K/uL 89  136  138       Latest Ref Rng & Units 09/15/2022    8:32 AM 09/13/2022    3:37 PM 09/13/2022    2:30 PM  CMP  Glucose 70 - 99 mg/dL 196  164  185   BUN 6 - 20 mg/dL 38  39  41   Creatinine 0.44 - 1.00 mg/dL 0.56  0.65  0.70   Sodium 135 - 145 mmol/L 134  132  134   Potassium 3.5 - 5.1 mmol/L 4.6  4.0  5.1   Chloride 98 - 111 mmol/L 109  106  105   CO2 22 - 32 mmol/L '18  21  17   '$ Calcium 8.9 - 10.3 mg/dL 8.6  8.8  9.0   Total Protein 6.5 - 8.1 g/dL 6.5  7.5  7.4   Total Bilirubin 0.3 - 1.2 mg/dL 0.6  0.9  0.6   Alkaline Phos 38 - 126 U/L 160  175  168   AST 15 - 41 U/L 58  51  56   ALT 0 - 44 U/L 45  44  42     Imaging studies: No new pertinent imaging studies this AM   Assessment/Plan:  57 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   - New 01/09:   - Will need daily dressing change of right chest port site; pack with gauze, cover, secure  - Continue IV Abx for now (Vancomycin / Cefepime); Cx negative to date  - Will need PICC this afternoon vs tomorrow  - Restart TPN (likely 01/10) once central access re-established  - Wound Care ([place]); No longer on intermittent or continuous suction. Patient seems to like this better and has started to manage effluent independently. She will need intermittent suction with Yankauer to control effluent. Likely be beneficial to suction before ambulation.   - TPN held as of 01/08 given lack of central access; will re-consult for PICC;  restart TPN likely 01/10 - Appreciate dietary assistance with trace elements; iron supplementation            - Monitor abdominal examination; on-going bowel function            - Pain control prn; antemetic prn            - Progressed with therapies; no longer any recommendations  - Appreciate neurosurgery assistance with compression fracture; serial XRs   - Discharge Planning: Continue to anticipate lengthy admission +/- possible transfer; no new progress regarding disposition unfortunately   All of the above findings and recommendations were discussed with the patient, and the medical team, and all of patient's questions were answered to her expressed satisfaction.  -- [name], PA-C [facility] 09/16/2022, 7:20 AM M-F: 7am - 4pm

## 2022-09-16 NOTE — Progress Notes (Signed)
Nutrition Follow-up  DOCUMENTATION CODES:   Obesity unspecified  INTERVENTION:   Once access regained:   Continue cyclic TPN per pharmacy (16 hrs)- provides 1817kcal/day and 102g/day protein   Ensure Enlive po TID, each supplement provides 350 kcal and 20 grams of protein.  MVI, chromium and trace elements in TPN   Daily weights   Vitamin D2- 50,000 units po weekly   Zinc '5mg'$  daily added to TPN   Check vitamin D, iodine and zinc labs.   NUTRITION DIAGNOSIS:   Increased nutrient needs related to wound healing, catabolic illness as evidenced by estimated needs.  GOAL:   Patient will meet greater than or equal to 90% of their needs -met with TPN   MONITOR:   PO intake, Supplement acceptance, Labs, Weight trends, Diet advancement, I & O's, TPN  ASSESSMENT:   57 y/o female with h/o hypothyroidism, COVID 19 (3/21), kidney stones and stage 3 colon cancer (s/p left hemicolectomy 5/21 and chemotherapy) who is admitted for new pelvic mass now s/p laparotomy 6/8 (with excision of pelvic mass from greater omentum, abdominal wall reconstruction with bilateral myocutaneous flaps and mesh, incisional hernia repair, appendectomy repair and VAC placement) complicated by bowel perforation s/p reopening of recent laparotomy 6/10 (with repair of small bowel perforation, excision of mesh, placement of two phasix mesh and VAC placement). Pathology returned as metastatic adenocarcinoma. Pt with L1 compression fracture.   Pt continues to tolerate cycled TPN well at goal rate; TPN is currently on hold as port was removed secondary to fevers. Plan is for PICC line. Triglycerides wnl and are being checked monthly. Hyperglycemia improved with insulin in TPN. Volume status improved; pt remains up ~16lbs from admission. Vitamin labs improved; will check zinc, vitamin D and Iodine levels. Will monitor vitamin labs every other month. Pt continues to have poor oral intake. [name] pouch remains in place with  975m output. No discharge plan in place.   Medications reviewed and include: D2, insulin, synthroid, protonix  -Selenium 93(L), Iodine 176.8(H), Chromium 2.3(H), copper 118 wnl, zinc 61 wnl, Manganese 18.6(H) - 12/7 -Iron 32.0 wnl, TIBC 330, ferritin 54 wnl, folate 14.4 wnl, B12 769- 12/7 -Vitamin B1- 91.2, vitamin D- 47.88, vitamin A- 21.5, B6- 22.8 wnl, vitamin C- 0.5, vitamin E- 12.7 wnl, vitamin K 0.82- 12/7   Labs reviewed: Na 134(L), K 4.6 wnl, BUN 38(H), P 3.4 wnl, Mg 2.3 wnl- 1/8 Triglycerides- 83-12/15 Wbc- 3.5(L), Hgb 7.3(L), Hct 23.3(L) Cbgs- 121, 95 x 24 hrs  Diet Order:    Diet Order             Diet clear liquid Room service appropriate? Yes; Fluid consistency: Thin  Diet effective now                  EDUCATION NEEDS:   Not appropriate for education at this time  Skin:  Skin Assessment: Reviewed RN Assessment ( Midline wound: 11cm x 12cm )  Last BM:  1/8- TYPE 5  Height:   Ht Readings from Last 1 Encounters:  03/01/22 '4\' 11"'$  (1.499 m)    Weight:   Wt Readings from Last 1 Encounters:  09/16/22 97 kg    Ideal Body Weight:  44.3 kg  BMI:  Body mass index is 43.19 kg/m.  Estimated Nutritional Needs:   Kcal:  1800-2100kcal/day  Protein:  90-110g/d  Fluid:  1.4-1.6L/day  CKoleen DistanceMS, RD, LDN Please refer to [facility] and/or RD on-call/weekend/after hours pager

## 2022-09-17 LAB — CULTURE, BLOOD (ROUTINE X 2)
Culture: NO GROWTH
Culture: NO GROWTH
Special Requests: ADEQUATE

## 2022-09-17 LAB — GLUCOSE, CAPILLARY
Glucose-Capillary: 86 mg/dL (ref 70–99)
Glucose-Capillary: 91 mg/dL (ref 70–99)
Glucose-Capillary: 114 mg/dL — ABNORMAL HIGH (ref 70–99)
Glucose-Capillary: 116 mg/dL — ABNORMAL HIGH (ref 70–99)
Glucose-Capillary: 129 mg/dL — ABNORMAL HIGH (ref 70–99)

## 2022-09-17 LAB — CATH TIP CULTURE: Culture: >=100000 — AB

## 2022-09-17 MED ORDER — ZINC CHLORIDE 1 MG/ML IV SOLN
INTRAVENOUS | Status: AC
  Filled 2022-09-17: qty 681.33

## 2022-09-17 NOTE — Progress Notes (Signed)
PHARMACY - TOTAL PARENTERAL NUTRITION CONSULT NOTE   Indication: Prolonged ileus  Patient Measurements: Height: '4\' 11"'$  (149.9 cm) Weight: 97 kg (213 lb 13.5 oz) IBW/kg (Calculated) : 43.2 TPN AdjBW (KG): 55.2 Body mass index is 43.19 kg/m.  Assessment: Debra [name] is a 57 y.o. female s/p laparotomy, excision of greater omental mass, abdominal wall reconstruction with [name], appendectomy, and placement of Prevena vac.  Glucose / Insulin:  --No apparent history of diabetes --BG 87 - 121 mg/dL   --rSSI 4x/day  --SSI: 3u last 24h Electrolytes: WNL Renal: SCr stable at approximate baseline  Hepatic: LFTs trending up slightly  --bilirubin wnl GI Imaging: 9/11 CTAP: no new acute issues GI Surgeries / Procedures: s/p laparotomy, excision of greater omental mass, abdominal wall reconstruction with [name], appendectomy, and placement of Prevena vac  Central access: 02/15/22 TPN start date: 02/15/22  Nutritional Goals: Goal cyclic TPN over 16 hrs: cyclic TPN over 16 hrs: (provides 102 g of protein and 1817 kcals per day) total volume over 16hrs for calculations=1400 ml (1500 ml total with overfill)  RD Assessment:  Estimated Needs Total Energy Estimated Needs: 1800-2100kcal/day Total Protein Estimated Needs: 90-110g/d Total Fluid Estimated Needs: 1.4-1.6L/day  Current Nutrition:  Soft diet + nutritional supplements, not meeting PO needs yet   Plan: Transitioned to *Cyclic* TPN on 1/77.   to run over 16 hours per MD request. [name] at 2000 per discussion with Dietician to allow patient time for walking in afternoon/evenings.  To run over 16 hours: -Start rate at 49 mL/hr for 1 hour. -Increase rate to 98 mL/hr for 13 hours.  -Decrease rate to 49 mL/hr for 1 hour. -Decrease rate to 25 mL/hr for 1 hour, then stop.  Plan:  See cyclic TPN rate above Continue Nutritional Components Amino acids (using 15% Clinisol): 102 g Dextrose 19% = 266  g Lipids (using 20% SMOFlipids): 50.4 g kCal: 1817/24h  Electrolytes in TPN: Na 63mq/L, K 436m/L, Ca 19m28mL, Mg 10 mEq/L, Phos 10 mmol/L, Cl:Ac 1:1 Continue MVI, trace elements, chromium and zinc per dietary recs. Continue CBG/SSI for Cyclic TPN:  -CBG 2 hrs after cyclic TPN start -CBG during middle of cyclic TPN infusion -CBG 1 hr after cylic TPN stopped -CBG while off TPN Continue resistant SSI + 20u insulin in TPN .  Check TPN labs on Mon/Thurs at minimum Monitor for further need of diuresis and adjust as needed for fluid status zinc 5 mg added to TPN 12/16 per dietician recommendation.  [name], BCPS Clinical Pharmacist 09/17/2022 10:12 AM

## 2022-09-17 NOTE — TOC Progression Note (Signed)
Transition of Care [facility]) - Progression Note    Patient Details  Name: Debra [name] MRN: [id number] Date of Birth: [birth year]  [facility] CM/SW Contact  Beverly Sessions, RN Phone Number: 09/17/2022, 9:36 AM  Clinical Narrative:    PICC line placed yesterday Plan to resume TPN today    Expected Discharge Plan: [place] Barriers to Discharge: Continued Medical Work up  Expected Discharge Plan and Services                                               Social Determinants of Health (SDOH) Interventions SDOH Screenings   Food Insecurity: No Food Insecurity (05/20/2022)  Housing: Low Risk  (05/20/2022)  Transportation Needs: No Transportation Needs (05/20/2022)  Utilities: Not At Risk (05/20/2022)  Tobacco Use: Low Risk  (09/12/2022)    Readmission Risk Interventions     No data to display

## 2022-09-17 NOTE — Progress Notes (Addendum)
[facility] SURGICAL PROGRESS NOTE (cpt [phone])  Hospital Day(s): 216.   Post op day(s): 214 Days Post-Op.   Interval History:  Patient seen and examined No fever last 72 hours BCx reviewed and without growth Port Tip Cx pending still  Respiratory panel negative [name] output 1100 ccs + unmeasured; she is starting to manage effluent independently She is on CLD currently TPN held given lack of central access; PICC replaced late on 01/09 On Cefepime and Vancomycin; Day 3  Review of Systems:  Constitutional: denies fever, chills  HEENT: denies cough or congestion  Respiratory: denies any shortness of breath  Cardiovascular: denies chest pain or palpitations  Gastrointestinal: + Abdominal pain, denies N/V Genitourinary: denies burning with urination or urinary frequency Integumentary: + midline wound (stable)   Vital signs in last 24 hours: [min-max] current  Temp:  [97.6 F (36.4 C)-98.1 F (36.7 C)] 98 F (36.7 C) (01/10 0408) Pulse Rate:  [66-70] 70 (01/10 0408) Resp:  [16-18] 16 (01/10 0408) BP: (95-100)/(53-59) 100/59 (01/10 0408) SpO2:  [97 %-100 %] 100 % (01/10 0408)     Height: '4\' 11"'$  (149.9 cm) Weight: 97 kg BMI (Calculated): 43.04   Intake/Output last 2 shifts:  01/09 0701 - 01/10 0700 In: 1946.1 [I.V.:1646.1; IV Piggyback:300] Out: 1100 [Drains:1100]   Physical Exam:  Constitutional: alert, cooperative and no distress  Respiratory: breathing non-labored at rest  Cardiovascular: regular rate and sinus rhythm  Gastrointestinal: Soft, rather unchanged suprapubic tenderness (mild), non-distended, no rebound/guarding. Integumentary:  Port removal site in right chest is healing via secondary intention; no erythema, packing in place   Midline wound open, peritoneum closed; granulating; there are three stomatized areas visible in the LUQ and LLQ portions of the wound, output remains feculent. There is hypergranulation tissue to the inferior wound edge.      Labs:     Latest Ref Rng & Units 09/13/2022    3:37 PM 09/11/2022    8:56 AM 08/11/2022    4:23 AM  CBC  WBC 4.0 - 10.5 K/uL 3.5  6.3  4.8   Hemoglobin 12.0 - 15.0 g/dL 7.3  8.2  7.8   Hematocrit 36.0 - 46.0 % 23.3  26.3  24.1   Platelets 150 - 400 K/uL 89  136  138       Latest Ref Rng & Units 09/15/2022    8:32 AM 09/13/2022    3:37 PM 09/13/2022    2:30 PM  CMP  Glucose 70 - 99 mg/dL 196  164  185   BUN 6 - 20 mg/dL 38  39  41   Creatinine 0.44 - 1.00 mg/dL 0.56  0.65  0.70   Sodium 135 - 145 mmol/L 134  132  134   Potassium 3.5 - 5.1 mmol/L 4.6  4.0  5.1   Chloride 98 - 111 mmol/L 109  106  105   CO2 22 - 32 mmol/L '18  21  17   '$ Calcium 8.9 - 10.3 mg/dL 8.6  8.8  9.0   Total Protein 6.5 - 8.1 g/dL 6.5  7.5  7.4   Total Bilirubin 0.3 - 1.2 mg/dL 0.6  0.9  0.6   Alkaline Phos 38 - 126 U/L 160  175  168   AST 15 - 41 U/L 58  51  56   ALT 0 - 44 U/L 45  44  42     Imaging studies: No new pertinent imaging studies this AM   Assessment/Plan:  57 y.o. female  with high output enterocutaneous fistula 214 Days Post-Op s/p re-opening of laparotomy for repair of small bowel perforation following initial laparotomy, excision of greater omental mass, abdominal wall reconstruction with [name], appendectomy, and placement of Prevena vac on 06/08.   - New 01/10:   - Continue daily dressing change of right chest port site; pack with gauze, cover, secure  - Continue IV Abx for now (Vancomycin / Cefepime); Cx negative to date; Day 3  - Restart TPN today (01/10) now that PICC is in place   - Wound Care ([place]); Appreciate WOC RN assistance. No longer on intermittent or continuous suction. Patient seems to like this better and has started to manage effluent independently. She will need intermittent suction with Yankauer to control effluent. Likely be beneficial to suction before ambulation.   - Appreciate dietary assistance with trace elements; iron supplementation             - Monitor abdominal examination; on-going bowel function            - Pain control prn; antemetic prn            - Progressed with therapies; no longer any recommendations  - Appreciate neurosurgery assistance with compression fracture; serial XRs   - Discharge Planning: Continue to anticipate lengthy admission +/- possible transfer; no new progress regarding disposition unfortunately   All of the above findings and recommendations were discussed with the patient, and the medical team, and all of patient's questions were answered to her expressed satisfaction.  -- [name], PA-C [facility] 09/17/2022, 7:30 AM M-F: 7am - 4pm

## 2022-09-17 NOTE — Consult Note (Signed)
[place] Nurse wound follow up Evaluated with charge nurse, Minette Brine, RN.  Wound type:midline fistula with large [name] pouch in place.  Was placed 09/16/22 by Sammi, RN due to leaking.  New epithelial tissue in center noted.  Stomatized fistula at distal end ( 5 o'clock) is most productive.  Stomatized fistula at 1 o'clock produces mucus.  Undermining at distal end.  Uses [place]ur suction intermittently  Wound bed: 15% epithelial tissue 2 stomatized fistulas Drainage (amount, consistency, odor)  Liquid brown effluent  feculent odor Periwound: New epithelial tissue growth noted, pouch intact (changed 1 day ago)  Dressing procedure/placement/frequency: Large [name] pouch with window and spout.  [name] strips (center of pouch that is cut away) line the periwound, stoma powder and skin prep.   Will follow.   Estrellita Ludwig MSN, RN, FNP-BC CWON Wound, Ostomy, Continence Nurse [facility] [phone] Pager [phone]

## 2022-09-18 LAB — COMPREHENSIVE METABOLIC PANEL
ALT: 33 U/L (ref 0–44)
AST: 30 U/L (ref 15–41)
Albumin: 2.2 g/dL — ABNORMAL LOW (ref 3.5–5.0)
Alkaline Phosphatase: 155 U/L — ABNORMAL HIGH (ref 38–126)
Anion gap: 4 — ABNORMAL LOW (ref 5–15)
BUN: 21 mg/dL — ABNORMAL HIGH (ref 6–20)
CO2: 17 mmol/L — ABNORMAL LOW (ref 22–32)
Calcium: 7.8 mg/dL — ABNORMAL LOW (ref 8.9–10.3)
Chloride: 114 mmol/L — ABNORMAL HIGH (ref 98–111)
Creatinine, Ser: 0.44 mg/dL (ref 0.44–1.00)
GFR, Estimated: >60 mL/min (ref >60)
Glucose, Bld: 170 mg/dL — ABNORMAL HIGH (ref 70–99)
Potassium: 3.9 mmol/L (ref 3.5–5.1)
Sodium: 135 mmol/L (ref 135–145)
Total Bilirubin: 0.5 mg/dL (ref 0.3–1.2)
Total Protein: 5.7 g/dL — ABNORMAL LOW (ref 6.5–8.1)

## 2022-09-18 LAB — GLUCOSE, CAPILLARY
Glucose-Capillary: 165 mg/dL — ABNORMAL HIGH (ref 70–99)
Glucose-Capillary: 147 mg/dL — ABNORMAL HIGH (ref 70–99)
Glucose-Capillary: 81 mg/dL (ref 70–99)
Glucose-Capillary: 107 mg/dL — ABNORMAL HIGH (ref 70–99)

## 2022-09-18 LAB — PHOSPHORUS: Phosphorus: 2.5 mg/dL (ref 2.5–4.6)

## 2022-09-18 LAB — TRIGLYCERIDES: Triglycerides: 100 mg/dL (ref <150)

## 2022-09-18 LAB — MAGNESIUM: Magnesium: 1.9 mg/dL (ref 1.7–2.4)

## 2022-09-18 MED ORDER — ZINC CHLORIDE 1 MG/ML IV SOLN
INTRAVENOUS | Status: AC
  Filled 2022-09-18: qty 681.33

## 2022-09-18 NOTE — Progress Notes (Signed)
PHARMACY - TOTAL PARENTERAL NUTRITION CONSULT NOTE   Indication: Prolonged ileus  Patient Measurements: Height: '4\' 11"'$  (149.9 cm) Weight: 97 kg (213 lb 13.5 oz) IBW/kg (Calculated) : 43.2 TPN AdjBW (KG): 55.2 Body mass index is 43.19 kg/m.  Assessment: Debra [name] is a 57 y.o. female s/p laparotomy, excision of greater omental mass, abdominal wall reconstruction with [name], appendectomy, and placement of Prevena vac.  Glucose / Insulin:  --No apparent history of diabetes --BG 91 - 165 mg/dL   --rSSI 4x/day  --SSI: 7u last 24h Electrolytes: WNL Renal: SCr stable at approximate baseline  Hepatic: LFTs wnl --bilirubin wnl GI Imaging: 9/11 CTAP: no new acute issues GI Surgeries / Procedures: s/p laparotomy, excision of greater omental mass, abdominal wall reconstruction with [name], appendectomy, and placement of Prevena vac  Central access: 02/15/22 TPN start date: 02/15/22  Nutritional Goals: Goal cyclic TPN over 16 hrs: cyclic TPN over 16 hrs: (provides 102 g of protein and 1817 kcals per day) total volume over 16hrs for calculations=1400 ml (1500 ml total with overfill)  RD Assessment:  Estimated Needs Total Energy Estimated Needs: 1800-2100kcal/day Total Protein Estimated Needs: 90-110g/d Total Fluid Estimated Needs: 1.4-1.6L/day  Current Nutrition:  CLD + nutritional supplements, not meeting PO needs yet   Plan: Transitioned to *Cyclic* TPN on 1/61.   to run over 16 hours per MD request. [name] at 2000 per discussion with Dietician to allow patient time for walking in afternoon/evenings.  To run over 16 hours: -Start rate at 49 mL/hr for 1 hour. -Increase rate to 98 mL/hr for 13 hours.  -Decrease rate to 49 mL/hr for 1 hour. -Decrease rate to 25 mL/hr for 1 hour, then stop.  Plan:  See cyclic TPN rate above Continue Nutritional Components Amino acids (using 15% Clinisol): 102 g Dextrose 19% = 266 g Lipids (using 20%  SMOFlipids): 50.4 g kCal: 1817/24h  Electrolytes in TPN: Na 70mq/L, K 477m/L, Ca 68m62mL, Mg 10 mEq/L, Phos 10 mmol/L, Cl:Ac 1:2 Chloride elevated and CO2 is low, will change Cl:Ac ratio to 1:2 (09/18/22) Continue MVI, trace elements, chromium and zinc per dietary recs. Continue CBG/SSI for Cyclic TPN:  -CBG 2 hrs after cyclic TPN start -CBG during middle of cyclic TPN infusion -CBG 1 hr after cylic TPN stopped -CBG while off TPN Continue resistant SSI + 20u insulin in TPN .  Check TPN labs on Mon/Thurs at minimum Monitor for further need of diuresis and adjust as needed for fluid status zinc 5 mg added to TPN 12/16 per dietician recommendation.  HowLorin PicketharmD Clinical Pharmacist 09/18/2022 11:20 AM

## 2022-09-18 NOTE — Progress Notes (Signed)
[facility] SURGICAL PROGRESS NOTE (cpt [phone])  Hospital Day(s): 217.   Post op day(s): 215 Days Post-Op.   Interval History:  Patient seen and examined Fevers seemed to have resolved  BCx reviewed and without growth Port Tip Cx with staph epidermis  Respiratory panel negative Nutritional labs are pending this AM [name] output 1350 ccs + unmeasured; she is starting to manage effluent independently She is on CLD currently TPN held given lack of central access; PICC replaced late on 01/09 On Vancomycin; Day 4  Review of Systems:  Constitutional: denies fever, chills  HEENT: denies cough or congestion  Respiratory: denies any shortness of breath  Cardiovascular: denies chest pain or palpitations  Gastrointestinal: + Abdominal pain, denies N/V Genitourinary: denies burning with urination or urinary frequency Integumentary: + midline wound (stable)   Vital signs in last 24 hours: [min-max] current  Temp:  [97.6 F (36.4 C)-98.4 F (36.9 C)] 98.4 F (36.9 C) (01/11 0504) Pulse Rate:  [58-83] 83 (01/11 0504) Resp:  [16-20] 16 (01/11 0504) BP: (85-108)/(53-68) 87/53 (01/11 0504) SpO2:  [98 %-100 %] 100 % (01/11 0504)     Height: '4\' 11"'$  (149.9 cm) Weight: 97 kg BMI (Calculated): 43.04   Intake/Output last 2 shifts:  01/10 0701 - 01/11 0700 In: 6712 [I.V.:863.4; IV Piggyback:550.5] Out: 4580 [Drains:1350]   Physical Exam:  Constitutional: alert, cooperative and no distress  Respiratory: breathing non-labored at rest  Cardiovascular: regular rate and sinus rhythm  Gastrointestinal: Soft, rather unchanged suprapubic tenderness (mild), non-distended, no rebound/guarding. Integumentary:  Port removal site in right chest is healing via secondary intention; no erythema, packing in place   Midline wound open, peritoneum closed; granulating; there are three stomatized areas visible in the LUQ and LLQ portions of the wound, output remains feculent. There is  hypergranulation tissue to the inferior wound edge.     Labs:     Latest Ref Rng & Units 09/13/2022    3:37 PM 09/11/2022    8:56 AM 08/11/2022    4:23 AM  CBC  WBC 4.0 - 10.5 K/uL 3.5  6.3  4.8   Hemoglobin 12.0 - 15.0 g/dL 7.3  8.2  7.8   Hematocrit 36.0 - 46.0 % 23.3  26.3  24.1   Platelets 150 - 400 K/uL 89  136  138       Latest Ref Rng & Units 09/15/2022    8:32 AM 09/13/2022    3:37 PM 09/13/2022    2:30 PM  CMP  Glucose 70 - 99 mg/dL 196  164  185   BUN 6 - 20 mg/dL 38  39  41   Creatinine 0.44 - 1.00 mg/dL 0.56  0.65  0.70   Sodium 135 - 145 mmol/L 134  132  134   Potassium 3.5 - 5.1 mmol/L 4.6  4.0  5.1   Chloride 98 - 111 mmol/L 109  106  105   CO2 22 - 32 mmol/L '18  21  17   '$ Calcium 8.9 - 10.3 mg/dL 8.6  8.8  9.0   Total Protein 6.5 - 8.1 g/dL 6.5  7.5  7.4   Total Bilirubin 0.3 - 1.2 mg/dL 0.6  0.9  0.6   Alkaline Phos 38 - 126 U/L 160  175  168   AST 15 - 41 U/L 58  51  56   ALT 0 - 44 U/L 45  44  42     Imaging studies: No new pertinent imaging studies this AM  Assessment/Plan:  57 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   - New 01/10:   - Continue daily dressing change of right chest port site; pack with gauze, cover, secure  - Continue IV Abx for now (Vancomycin); Day 4; port cath tip growing staph epidermis   - Wound Care ([name] Pouch); Appreciate WOC RN assistance. No longer on intermittent or continuous suction. Patient seems to like this better and has started to manage effluent independently. She will need intermittent suction with Yankauer to control effluent. Likely be beneficial to suction before ambulation.    - Resumed TPN; nutritional labs pending  - Appreciate dietary assistance  with trace elements; iron supplementation            - Monitor abdominal examination; on-going bowel function            - Pain control prn; antemetic prn            - Progressed with therapies; no longer any recommendations  - Appreciate neurosurgery assistance with compression fracture; serial XRs   - Discharge Planning: Continue to anticipate lengthy admission +/- possible transfer; no new progress regarding disposition unfortunately   All of the above findings and recommendations were discussed with the patient, and the medical team, and all of patient's questions were answered to her expressed satisfaction.  -- [name], PA-C [facility] 09/18/2022, 7:35 AM M-F: 7am - 4pm

## 2022-09-18 NOTE — Progress Notes (Signed)
Pharmacy Antibiotic Note  Debra [name] is a 57 y.o. female w/ PMH of colon CA, s/p sigmoid colectomy 2 years ago.  hypothyroid, kidney stones admitted on 02/13/2022 with high output enterocutaneous fistula.  She developed fever over the weekend, port-a-cath was recmoved and TPN held x 2 days.  PICC line placed and TPN resumed 1/10. Catheter tip growing Staph Epi. Pharmacy has been consulted for vancomycin dosing. Renal function is stable  Plan:  Continue vancomycin 1250 mg every 24 hours  Goal AUC 400-550. Expected AUC: 539.3, Cmin 12.9 SCr used: 0.80 mg/dL (rounded up) Ke: 0.048 h-1, T1/2 14.5 h  Height: '4\' 11"'$  (149.9 cm) Weight: 97 kg (213 lb 13.5 oz) IBW/kg (Calculated) : 43.2  Temp (24hrs), Avg:98.1 F (36.7 C), Min:97.6 F (36.4 C), Max:98.4 F (36.9 C)  Recent Labs  Lab 09/13/22 1430 09/13/22 1537 09/15/22 0832  WBC  --  3.5*  --   CREATININE 0.70 0.65 0.56     Estimated Creatinine Clearance: 78.3 mL/min (by C-G formula based on SCr of 0.56 mg/dL).    No Known Allergies  recent antimicrobials this admission: 01/08 vancomycin >>  01/08 cefepime >>   Microbiology results: 01/05 BCx: NGTD 01/07 UCx: 30,000 cfu/ml multiple species 1/8 Catheter tip: Staph epidermidis  Thank you for allowing pharmacy to be a part of this patient's care.  Lorin Picket, PharmD 09/18/2022 11:13 AM

## 2022-09-18 NOTE — Plan of Care (Signed)
  Problem: Clinical Measurements: Goal: Will remain free from infection Outcome: Progressing   Problem: Nutrition: Goal: Adequate nutrition will be maintained Outcome: Progressing   Problem: Activity: Goal: Risk for activity intolerance will decrease Outcome: Progressing   Problem: Nutrition: Goal: Adequate nutrition will be maintained Outcome: Progressing   Problem: Coping: Goal: Level of anxiety will decrease Outcome: Progressing   Problem: Pain Managment: Goal: General experience of comfort will improve Outcome: Progressing

## 2022-09-19 LAB — GLUCOSE, CAPILLARY
Glucose-Capillary: 146 mg/dL — ABNORMAL HIGH (ref 70–99)
Glucose-Capillary: 118 mg/dL — ABNORMAL HIGH (ref 70–99)
Glucose-Capillary: 100 mg/dL — ABNORMAL HIGH (ref 70–99)
Glucose-Capillary: 103 mg/dL — ABNORMAL HIGH (ref 70–99)

## 2022-09-19 LAB — MISC LABCORP TEST (SEND OUT): Labcorp test code: 70034

## 2022-09-19 MED ORDER — ZINC CHLORIDE 1 MG/ML IV SOLN
INTRAVENOUS | Status: AC
  Filled 2022-09-19: qty 681.33

## 2022-09-19 NOTE — Progress Notes (Signed)
Pharmacy Antibiotic Note  Debra [name] is a 57 y.o. female w/ PMH of colon CA, s/p sigmoid colectomy 2 years ago.  hypothyroid, kidney stones admitted on 02/13/2022 with high output enterocutaneous fistula.  She developed fever over the previous weekend, port-a-cath was recmoved and TPN held x 2 days.  PICC line placed and TPN resumed 1/10. Catheter tip growing Staph Epi. Pharmacy has been consulted for vancomycin dosing. Renal function is stable  Plan: Continue vancomycin 1250 mg every 24 hours  Goal AUC 400-550. Expected AUC: 539.3, Cmin 12.9 SCr used: 0.80 mg/dL (rounded up) Ke: 0.048 h-1, T1/2 14.5 h  Height: '4\' 11"'$  (149.9 cm) Weight: 97 kg (213 lb 13.5 oz) IBW/kg (Calculated) : 43.2  Temp (24hrs), Avg:97.7 F (36.5 C), Min:97.6 F (36.4 C), Max:98 F (36.7 C)  Recent Labs  Lab 09/13/22 1430 09/13/22 1537 09/15/22 0832 09/18/22 1015  WBC  --  3.5*  --   --   CREATININE 0.70 0.65 0.56 0.44     Estimated Creatinine Clearance: 78.3 mL/min (by C-G formula based on SCr of 0.44 mg/dL).    No Known Allergies  recent antimicrobials this admission: 01/08 cefepime >> 01/11 01/08 vancomycin >>   Microbiology results: 01/05 BCx: NGTD 01/07 UCx: 30,000 cfu/ml multiple species 01/08 Catheter tip: S epidermidis  Thank you for allowing pharmacy to be a part of this patient's care.  Dallie Piles, PharmD 09/19/2022 7:05 AM

## 2022-09-19 NOTE — Progress Notes (Signed)
PHARMACY - TOTAL PARENTERAL NUTRITION CONSULT NOTE   Indication: Prolonged ileus  Patient Measurements: Height: '4\' 11"'$  (149.9 cm) Weight: 97 kg (213 lb 13.5 oz) IBW/kg (Calculated) : 43.2 TPN AdjBW (KG): 55.2 Body mass index is 43.19 kg/m.  Assessment: Debra [name] is a 57 y.o. female s/p laparotomy, excision of greater omental mass, abdominal wall reconstruction with Debra [name] release, appendectomy, and placement of Prevena vac.  Glucose / Insulin:  --No apparent history of diabetes --BG 81 - 170 mg/dL   --rSSI 4x/day  --SSI: 6u last 24h  20 units insulin in TPN Electrolytes: WNL Renal: SCr stable at approximate baseline  Hepatic: LFTs wnl --bilirubin wnl GI Imaging: 9/11 CTAP: no new acute issues GI Surgeries / Procedures: s/p laparotomy, excision of greater omental mass, abdominal wall reconstruction with Debra [name] release, appendectomy, and placement of Prevena vac  Central access: 02/15/22 TPN start date: 02/15/22  Nutritional Goals: Goal cyclic TPN over 16 hrs: cyclic TPN over 16 hrs: (provides 102 g of protein and 1817 kcals per day) total volume over 16hrs for calculations=1400 ml (1500 ml total with overfill)  RD Assessment:  Estimated Needs Total Energy Estimated Needs: 1800-2100kcal/day Total Protein Estimated Needs: 90-110g/d Total Fluid Estimated Needs: 1.4-1.6L/day  Current Nutrition:  CLD + nutritional supplements, not meeting PO needs yet   Plan: Transitioned to *Cyclic* TPN on 5/18.   to run over 16 hours per MD request. Debra [name] at 2000 per discussion with Dietician to allow patient time for walking in afternoon/evenings.  To run over 16 hours: -Start rate at 49 mL/hr for 1 hour. -Increase rate to 98 mL/hr for 13 hours.  -Decrease rate to 49 mL/hr for 1 hour. -Decrease rate to 25 mL/hr for 1 hour, then stop.  Plan:  See cyclic TPN rate above Continue Nutritional Components Amino acids (using 15% Clinisol): 102 g Dextrose 19% = 266  g Lipids (using 20% SMOFlipids): 50.4 g kCal: 1817/24h  Electrolytes in TPN: Na 37mq/L, K 427m/L, Ca 24m52mL, Mg 10 mEq/L, Phos 10 mmol/L, Cl:Ac 1:2 Chloride elevated and CO2 is low, will change Cl:Ac ratio to 1:2 (09/18/22) Continue MVI, trace elements, chromium and zinc per dietary recs. Continue CBG/SSI for Cyclic TPN:  -CBG 2 hrs after cyclic TPN start -CBG during middle of cyclic TPN infusion -CBG 1 hr after cylic TPN stopped -CBG while off TPN Continue resistant SSI + 20u insulin in TPN .  Check TPN labs on Mon/Thurs at minimum Monitor for further need of diuresis and adjust as needed for fluid status zinc 5 mg added to TPN 12/16 per dietician recommendation.  Debra [name] Clinical Pharmacist 09/19/2022 7:05 AM

## 2022-09-19 NOTE — Progress Notes (Addendum)
[facility] SURGICAL PROGRESS NOTE (cpt [phone])  Hospital Day(s): 218.   Post op day(s): 216 Days Post-Op.   Interval History:  Patient seen and examined Fevers seemed to have resolved  Port Tip Cx with staph epidermis  Respiratory panel negative [name] output unmeasured; she is starting to manage effluent independently She is on CLD currently TPN held given lack of central access; PICC replaced late on 01/09 On Vancomycin; Day 5/7  Review of Systems:  Constitutional: denies fever, chills  HEENT: denies cough or congestion  Respiratory: denies any shortness of breath  Cardiovascular: denies chest pain or palpitations  Gastrointestinal: + Abdominal pain, denies N/V Genitourinary: denies burning with urination or urinary frequency Integumentary: + midline wound (stable)   Vital signs in last 24 hours: [min-max] current  Temp:  [97.6 F (36.4 C)-98 F (36.7 C)] 97.6 F (36.4 C) (01/12 0439) Pulse Rate:  [63-87] 74 (01/12 0600) Resp:  [15-18] 15 (01/12 0439) BP: (83-133)/(46-68) 94/57 (01/12 0600) SpO2:  [83 %-100 %] 100 % (01/12 0439)     Height: '4\' 11"'$  (149.9 cm) Weight: 97 kg BMI (Calculated): 43.04   Intake/Output last 2 shifts:  01/11 0701 - 01/12 0700 In: 810.5 [I.V.:560.5; IV Piggyback:250.1] Out: 50 [Drains:50]   Physical Exam:  Constitutional: alert, cooperative and no distress  Respiratory: breathing non-labored at rest  Cardiovascular: regular rate and sinus rhythm  Gastrointestinal: Soft, rather unchanged suprapubic tenderness (mild), non-distended, no rebound/guarding. Integumentary:  Port removal site in right chest is healing via secondary intention; no erythema, packing in place   Midline wound open, peritoneum closed; granulating; there are three stomatized areas visible in the LUQ and LLQ portions of the wound, output remains feculent. There is hypergranulation tissue to the inferior wound edge.     Labs:     Latest Ref Rng & Units  09/13/2022    3:37 PM 09/11/2022    8:56 AM 08/11/2022    4:23 AM  CBC  WBC 4.0 - 10.5 K/uL 3.5  6.3  4.8   Hemoglobin 12.0 - 15.0 g/dL 7.3  8.2  7.8   Hematocrit 36.0 - 46.0 % 23.3  26.3  24.1   Platelets 150 - 400 K/uL 89  136  138       Latest Ref Rng & Units 09/18/2022   10:15 AM 09/15/2022    8:32 AM 09/13/2022    3:37 PM  CMP  Glucose 70 - 99 mg/dL 170  196  164   BUN 6 - 20 mg/dL 21  38  39   Creatinine 0.44 - 1.00 mg/dL 0.44  0.56  0.65   Sodium 135 - 145 mmol/L 135  134  132   Potassium 3.5 - 5.1 mmol/L 3.9  4.6  4.0   Chloride 98 - 111 mmol/L 114  109  106   CO2 22 - 32 mmol/L '17  18  21   '$ Calcium 8.9 - 10.3 mg/dL 7.8  8.6  8.8   Total Protein 6.5 - 8.1 g/dL 5.7  6.5  7.5   Total Bilirubin 0.3 - 1.2 mg/dL 0.5  0.6  0.9   Alkaline Phos 38 - 126 U/L 155  160  175   AST 15 - 41 U/L 30  58  51   ALT 0 - 44 U/L 33  45  44     Imaging studies: No new pertinent imaging studies this AM   Assessment/Plan:  57 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   - New 01/12:   - Continue daily dressing change of right chest port site; okay to cover with Band-Aid or gauze with tape  - Continue IV Abx for now (Vancomycin); Day 5/7; port cath tip growing staph epidermis. If doing well, can stop after weekend  - Wound Care ([name] Pouch); Appreciate WOC RN assistance. No longer on intermittent or continuous suction. Patient seems to like this better and has started to manage effluent independently. She will need intermittent suction with Yankauer to control effluent. Likely be beneficial to suction before ambulation.    - Resumed TPN; nutritional labs pending  - Appreciate dietary assistance with trace elements; iron  supplementation            - Monitor abdominal examination; on-going bowel function            - Pain control prn; antemetic prn            - Progressed with therapies; no longer any recommendations  - Appreciate neurosurgery assistance with compression fracture; serial XRs   - Discharge Planning: Continue to anticipate lengthy admission +/- possible transfer; no new progress regarding disposition unfortunately   All of the above findings and recommendations were discussed with the patient, and the medical team, and all of patient's questions were answered to her expressed satisfaction.  -- [name], PA-C [facility] 09/19/2022, 7:25 AM M-F: 7am - 4pm

## 2022-09-20 LAB — BASIC METABOLIC PANEL
Anion gap: 6 (ref 5–15)
BUN: 20 mg/dL (ref 6–20)
CO2: 21 mmol/L — ABNORMAL LOW (ref 22–32)
Calcium: 8 mg/dL — ABNORMAL LOW (ref 8.9–10.3)
Chloride: 109 mmol/L (ref 98–111)
Creatinine, Ser: 0.48 mg/dL (ref 0.44–1.00)
GFR, Estimated: >60 mL/min (ref >60)
Glucose, Bld: 116 mg/dL — ABNORMAL HIGH (ref 70–99)
Potassium: 4 mmol/L (ref 3.5–5.1)
Sodium: 136 mmol/L (ref 135–145)

## 2022-09-20 LAB — ZINC: Zinc: 95 ug/dL (ref 44–115)

## 2022-09-20 MED ORDER — ALTEPLASE 2 MG IJ SOLR
2.0000 mg | Freq: Once | INTRAMUSCULAR | Status: AC
  Administered 2022-09-20: 2 mg
  Filled 2022-09-20: qty 2

## 2022-09-20 MED ORDER — ZINC CHLORIDE 1 MG/ML IV SOLN: INTRAVENOUS | Status: DC

## 2022-09-20 MED ORDER — ZINC CHLORIDE 1 MG/ML IV SOLN
INTRAVENOUS | Status: AC
  Filled 2022-09-20: qty 672

## 2022-09-20 NOTE — Progress Notes (Signed)
[facility] SURGICAL PROGRESS NOTE (cpt [phone])  Hospital Day(s): 219.   Post op day(s): 217 Days Post-Op.   Interval History:  Patient seen and examined Afebrile H/o Port Tip Cx with staph epidermis  Respiratory panel negative [name] output unmeasured; she is starting to manage effluent independently She is on CLD currently TPN; has left arm PICC line replaced late on 01/09 On Vancomycin; Day 6/7 A bit of the bleeding noted again last night, self limited  Review of Systems:  Constitutional: denies fever, chills  HEENT: denies cough or congestion  Respiratory: denies any shortness of breath  Cardiovascular: denies chest pain or palpitations  Gastrointestinal: + Abdominal pain, denies N/V Genitourinary: denies burning with urination or urinary frequency Integumentary: + midline wound (stable)   Vital signs in last 24 hours: [min-max] current  Temp:  [97.5 F (36.4 C)-97.9 F (36.6 C)] 97.8 F (36.6 C) (01/13 0806) Pulse Rate:  [63-77] 72 (01/13 0806) Resp:  [16-18] 18 (01/13 0806) BP: (95-121)/(46-57) 121/53 (01/13 0806) SpO2:  [97 %-100 %] 98 % (01/13 0806) Weight:  [98.9 kg] 98.9 kg (01/13 0431)     Height: '4\' 11"'$  (149.9 cm) Weight: 98.9 kg BMI (Calculated): 43.04   Intake/Output last 2 shifts:  01/12 0701 - 01/13 0700 In: 1561.3 [P.O.:120; I.V.:1191.3; IV Piggyback:250] Out: 725 [Drains:725]   Physical Exam:  Constitutional: alert, cooperative and no distress  Respiratory: breathing non-labored at rest  Cardiovascular: regular rate and sinus rhythm  Gastrointestinal: Soft, rather unchanged suprapubic tenderness (mild), non-distended, no rebound/guarding. Integumentary:  Port removal site in right chest is healing via secondary intention; no erythema, no packing in place   Midline wound open, peritoneum closed; granulating; there are three stomatized areas visible in the LUQ and LLQ portions of the wound, output today is clear bilious. There is  hypergranulation tissue to the inferior wound edge.     Labs:     Latest Ref Rng & Units 09/13/2022    3:37 PM 09/11/2022    8:56 AM 08/11/2022    4:23 AM  CBC  WBC 4.0 - 10.5 K/uL 3.5  6.3  4.8   Hemoglobin 12.0 - 15.0 g/dL 7.3  8.2  7.8   Hematocrit 36.0 - 46.0 % 23.3  26.3  24.1   Platelets 150 - 400 K/uL 89  136  138       Latest Ref Rng & Units 09/18/2022   10:15 AM 09/15/2022    8:32 AM 09/13/2022    3:37 PM  CMP  Glucose 70 - 99 mg/dL 170  196  164   BUN 6 - 20 mg/dL 21  38  39   Creatinine 0.44 - 1.00 mg/dL 0.44  0.56  0.65   Sodium 135 - 145 mmol/L 135  134  132   Potassium 3.5 - 5.1 mmol/L 3.9  4.6  4.0   Chloride 98 - 111 mmol/L 114  109  106   CO2 22 - 32 mmol/L '17  18  21   '$ Calcium 8.9 - 10.3 mg/dL 7.8  8.6  8.8   Total Protein 6.5 - 8.1 g/dL 5.7  6.5  7.5   Total Bilirubin 0.3 - 1.2 mg/dL 0.5  0.6  0.9   Alkaline Phos 38 - 126 U/L 155  160  175   AST 15 - 41 U/L 30  58  51   ALT 0 - 44 U/L 33  45  44     Imaging studies: No new pertinent imaging studies this  AM   Assessment/Plan:  57 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   - New 01/12: Nothing new January 13.  - Continue daily dressing change of right chest port site; normal saline half-inch packing strip wet to moist, okay to cover with Band-Aid or gauze with tape  - Continue IV Abx for now (Vancomycin); Day 6/7; port cath tip growing staph epidermis. If doing well, can stop after weekend  - Wound Care ([name] Pouch); Appreciate WOC RN assistance. No longer on intermittent or continuous suction. Patient seems to like this better and has started to manage effluent independently. She will need intermittent suction with Yankauer to control effluent.  Likely be beneficial to suction before ambulation.    - Resumed TPN; nutritional labs pending  - Appreciate dietary assistance with trace elements; iron supplementation            - Monitor abdominal examination; on-going bowel function            - Pain control prn; antemetic prn            - Progressed with therapies; no longer any recommendations  - Appreciate neurosurgery assistance with compression fracture; serial XRs   - Discharge Planning: Continue to anticipate lengthy admission +/- possible transfer; no new progress regarding disposition unfortunately   All of the above findings and recommendations were discussed with the patient, and the medical team, and all of patient's questions were answered to her expressed satisfaction.  Ronny Bacon, M.D., [facility]  09/20/2022 ; 11:14 AM

## 2022-09-20 NOTE — Progress Notes (Addendum)
PHARMACY - TOTAL PARENTERAL NUTRITION CONSULT NOTE   Indication: Prolonged ileus  Patient Measurements: Height: '4\' 11"'$  (149.9 cm) Weight: 98.9 kg (218 lb 0.6 oz) IBW/kg (Calculated) : 43.2 TPN AdjBW (KG): 55.2 Body mass index is 44.04 kg/m.  Assessment: Debra [name] is a 57 y.o. female s/p laparotomy, excision of greater omental mass, abdominal wall reconstruction with [name], appendectomy, and placement of Prevena vac.  Glucose / Insulin:  --No apparent history of diabetes --BG 81 - 170 mg/dL   --rSSI 4x/day  --SSI: 6u last 24h  20 units insulin in TPN Electrolytes: WNL Renal: SCr stable at approximate baseline  Hepatic: LFTs wnl --bilirubin wnl GI Imaging: 9/11 CTAP: no new acute issues GI Surgeries / Procedures: s/p laparotomy, excision of greater omental mass, abdominal wall reconstruction with [name], appendectomy, and placement of Prevena vac  Central access: 02/15/22 TPN start date: 02/15/22  Nutritional Goals: Goal cyclic TPN over 16 hrs: cyclic TPN over 16 hrs: (provides 102 g of protein and 1811 kcals per day) total volume over 16hrs for calculations=1400 ml (1500 ml total with overfill)  RD Assessment:  Estimated Needs Total Energy Estimated Needs: 1800-2100kcal/day Total Protein Estimated Needs: 90-110g/d Total Fluid Estimated Needs: 1.4-1.6L/day  Current Nutrition:  CLD + nutritional supplements, not meeting PO needs yet   Plan: Transitioned to *Cyclic* TPN on 3/87.   to run over 16 hours per MD request. [name] at 2000 per discussion with Dietician to allow patient time for walking in afternoon/evenings.  To run over 16 hours: -Start rate at 49 mL/hr for 1 hour. -Increase rate to 98 mL/hr for 13 hours.  -Decrease rate to 49 mL/hr for 1 hour. -Decrease rate to 25 mL/hr for 1 hour, then stop.  Plan:  See cyclic TPN rate above Continue Nutritional Components Amino acids (using 15% Clinisol): 101 g Dextrose 19% = 266  g Lipids (using 20% SMOFlipids): 50.4 g kCal: 1817/24h  Electrolytes in TPN: Na 64mq/L, K 483m/L, Ca 26m67mL, Mg 10 mEq/L, Phos 10 mmol/L, Cl:Ac 1:2 Chloride elevated and CO2 is low, will change Cl:Ac ratio to 1:2 (09/18/22) Continue MVI, trace elements, chromium and zinc per dietary recs. Continue CBG/SSI for Cyclic TPN:  -CBG 2 hrs after cyclic TPN start -CBG during middle of cyclic TPN infusion -CBG 1 hr after cylic TPN stopped -CBG while off TPN Continue resistant SSI + 20u insulin in TPN .  Check TPN labs on Mon/Thurs at minimum Monitor for further need of diuresis and adjust as needed for fluid status zinc 5 mg added to TPN 12/16 per dietician recommendation.  [name] Clinical Pharmacist 09/20/2022 9:20 AM

## 2022-09-20 NOTE — Progress Notes (Signed)
   09/20/22 0000  Provider Notification  Provider Name/Title Dr. Luther Bradley  Date Provider Notified 09/19/22  Time Provider Notified 2355  Method of Notification Call  Notification Reason Other (Comment) (Noted for minimal blood output with some clots to [name] pouch. Blood pressure on soft side. No new orders made. COntinue to observe site for hemmorhage.)  Provider response Other (Comment);No new orders  Date of Provider Response 09/19/22  Time of Provider Response [phone]

## 2022-09-21 LAB — GLUCOSE, CAPILLARY
Glucose-Capillary: 82 mg/dL (ref 70–99)
Glucose-Capillary: 127 mg/dL — ABNORMAL HIGH (ref 70–99)

## 2022-09-21 MED ORDER — ZINC CHLORIDE 1 MG/ML IV SOLN
INTRAVENOUS | Status: AC
  Filled 2022-09-21: qty 672

## 2022-09-21 NOTE — Progress Notes (Signed)
PHARMACY - TOTAL PARENTERAL NUTRITION CONSULT NOTE   Indication: Prolonged ileus  Patient Measurements: Height: '4\' 11"'$  (149.9 cm) Weight: 98.9 kg (218 lb 0.6 oz) IBW/kg (Calculated) : 43.2 TPN AdjBW (KG): 55.2 Body mass index is 44.04 kg/m.  Assessment: Debra [name] is a 57 y.o. female s/p laparotomy, excision of greater omental mass, abdominal wall reconstruction with [name], appendectomy, and placement of Prevena vac.  Glucose / Insulin:  --No apparent history of diabetes --BG 103 - 146 mg/dL   --rSSI 4x/day  --SSI: 6u last 24h  20 units insulin in TPN Electrolytes: WNL Renal: SCr stable at approximate baseline  Hepatic: LFTs wnl --bilirubin wnl GI Imaging: 9/11 CTAP: no new acute issues GI Surgeries / Procedures: s/p laparotomy, excision of greater omental mass, abdominal wall reconstruction with [name], appendectomy, and placement of Prevena vac  Central access: 02/15/22 TPN start date: 02/15/22  Nutritional Goals: Goal cyclic TPN over 16 hrs: cyclic TPN over 16 hrs: (provides 102 g of protein and 1811 kcals per day) total volume over 16hrs for calculations=1400 ml (1500 ml total with overfill)  RD Assessment:  Estimated Needs Total Energy Estimated Needs: 1800-2100kcal/day Total Protein Estimated Needs: 90-110g/d Total Fluid Estimated Needs: 1.4-1.6L/day  Current Nutrition:  CLD + nutritional supplements, not meeting PO needs yet   Plan: Transitioned to *Cyclic* TPN on 0/93.   to run over 16 hours per MD request. [name] at 2000 per discussion with Dietician to allow patient time for walking in afternoon/evenings.  To run over 16 hours: -Start rate at 49 mL/hr for 1 hour. -Increase rate to 98 mL/hr for 13 hours.  -Decrease rate to 49 mL/hr for 1 hour. -Decrease rate to 25 mL/hr for 1 hour, then stop.  Plan:  See cyclic TPN rate above Continue Nutritional Components Amino acids (using 15% Clinisol): 101 g Dextrose 19% = 266  g Lipids (using 20% SMOFlipids): 50.4 g kCal: 1817/24h  Electrolytes in TPN: Na 41mq/L, K 43m/L, Ca 35m235mL, Mg 10 mEq/L, Phos 10 mmol/L, Cl:Ac 1:2 Chloride elevated and CO2 is low, will change Cl:Ac ratio to 1:2 (09/18/22) Continue MVI, trace elements, chromium and zinc per dietary recs. Continue CBG/SSI for Cyclic TPN:  -CBG 2 hrs after cyclic TPN start -CBG during middle of cyclic TPN infusion -CBG 1 hr after cylic TPN stopped -CBG while off TPN Continue resistant SSI + 20u insulin in TPN .  Check TPN labs on Mon/Thurs at minimum Monitor for further need of diuresis and adjust as needed for fluid status zinc 5 mg added to TPN 12/16 per dietician recommendation.  [name] Clinical Pharmacist 09/21/2022 9:37 AM

## 2022-09-21 NOTE — Plan of Care (Signed)
  Problem: Clinical Measurements: Goal: Will remain free from infection Outcome: Progressing   Problem: Nutrition: Goal: Adequate nutrition will be maintained Outcome: Progressing   Problem: Activity: Goal: Risk for activity intolerance will decrease Outcome: Progressing   Problem: Nutrition: Goal: Adequate nutrition will be maintained Outcome: Progressing   Problem: Coping: Goal: Level of anxiety will decrease Outcome: Progressing   Problem: Pain Managment: Goal: General experience of comfort will improve Outcome: Progressing

## 2022-09-21 NOTE — Progress Notes (Signed)
[facility] SURGICAL PROGRESS NOTE (cpt [phone])  Hospital Day(s): 220.   Post op day(s): 218 Days Post-Op.   Interval History:  Patient seen and examined Afebrile H/o Port Tip Cx with staph epidermis  Respiratory panel negative [name] output unmeasured; she is starting to manage effluent independently She is on CLD currently TPN; has left arm PICC line tPA yesterday, was unable to draw blood On Vancomycin; Day 7/7  Review of Systems:  Constitutional: denies fever, chills  HEENT: denies cough or congestion  Respiratory: denies any shortness of breath  Cardiovascular: denies chest pain or palpitations  Gastrointestinal: + Abdominal pain, denies N/V Genitourinary: denies burning with urination or urinary frequency Integumentary: + midline wound (stable)   Vital signs in last 24 hours: [min-max] current  Temp:  [98 F (36.7 C)-98.4 F (36.9 C)] 98.3 F (36.8 C) (01/14 0745) Pulse Rate:  [64-73] 70 (01/14 0745) Resp:  [17-18] 18 (01/14 0745) BP: (89-110)/(51-64) 110/64 (01/14 0745) SpO2:  [90 %-98 %] 98 % (01/14 0745)     Height: '4\' 11"'$  (149.9 cm) Weight: 98.9 kg BMI (Calculated): 43.04   Intake/Output last 2 shifts:  01/13 0701 - 01/14 0700 In: 1994.2 [P.O.:960; I.V.:784.2; IV Piggyback:250] Out: 100 [Drains:100]   Physical Exam:  Constitutional: alert, cooperative and no distress  Respiratory: breathing non-labored at rest  Cardiovascular: regular rate and sinus rhythm  Gastrointestinal: Soft, rather unchanged suprapubic tenderness (mild), non-distended, no rebound/guarding. Integumentary:  Port removal site in right chest is healing via secondary intention; no erythema,  Midline wound open, peritoneum closed; granulating; there are three stomatized areas visible in the LUQ and LLQ portions of the wound, output today is clear bilious. There is hypergranulation tissue to the inferior wound edge.   Left PICC line present.  Vancomycin infusing.      Labs:     Latest Ref Rng & Units 09/13/2022    3:37 PM 09/11/2022    8:56 AM 08/11/2022    4:23 AM  CBC  WBC 4.0 - 10.5 K/uL 3.5  6.3  4.8   Hemoglobin 12.0 - 15.0 g/dL 7.3  8.2  7.8   Hematocrit 36.0 - 46.0 % 23.3  26.3  24.1   Platelets 150 - 400 K/uL 89  136  138       Latest Ref Rng & Units 09/20/2022    2:27 PM 09/18/2022   10:15 AM 09/15/2022    8:32 AM  CMP  Glucose 70 - 99 mg/dL 116  170  196   BUN 6 - 20 mg/dL 20  21  38   Creatinine 0.44 - 1.00 mg/dL 0.48  0.44  0.56   Sodium 135 - 145 mmol/L 136  135  134   Potassium 3.5 - 5.1 mmol/L 4.0  3.9  4.6   Chloride 98 - 111 mmol/L 109  114  109   CO2 22 - 32 mmol/L '21  17  18   '$ Calcium 8.9 - 10.3 mg/dL 8.0  7.8  8.6   Total Protein 6.5 - 8.1 g/dL  5.7  6.5   Total Bilirubin 0.3 - 1.2 mg/dL  0.5  0.6   Alkaline Phos 38 - 126 U/L  155  160   AST 15 - 41 U/L  30  58   ALT 0 - 44 U/L  33  45     Imaging studies: No new pertinent imaging studies this AM   Assessment/Plan:  57 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    - Continue daily dressing change of right chest port site; normal saline half-inch packing strip wet to moist, okay to cover with Band-Aid or gauze with tape  -Last day IV Abx for (Vancomycin); Day 7/7; port cath tip growing staph epidermis. If doing well, can stop after weekend  - Wound Care ([name] Pouch); Appreciate WOC RN assistance. No longer on intermittent or continuous suction. Patient seems to like this better and has started to manage effluent independently. She will need intermittent suction with Yankauer to control effluent. Likely be beneficial to suction before ambulation.    - Resumed TPN; nutritional labs pending  - Appreciate  dietary assistance with trace elements; iron supplementation            - Monitor abdominal examination; on-going bowel function            - Pain control prn; antemetic prn            - Progressed with therapies; no longer any recommendations  - Appreciate neurosurgery assistance with compression fracture; serial XRs   - Discharge Planning: Continue to anticipate lengthy admission +/- possible transfer; no new progress regarding disposition unfortunately   All of the above findings and recommendations were discussed with the patient, and the medical team, and all of patient's questions were answered to her expressed satisfaction.  Ronny Bacon, M.D., [facility]  09/21/2022 ; 12:45 PM

## 2022-09-22 LAB — COMPREHENSIVE METABOLIC PANEL
ALT: 21 U/L (ref 0–44)
AST: 28 U/L (ref 15–41)
Albumin: 2.4 g/dL — ABNORMAL LOW (ref 3.5–5.0)
Alkaline Phosphatase: 127 U/L — ABNORMAL HIGH (ref 38–126)
Anion gap: 5 (ref 5–15)
BUN: 20 mg/dL (ref 6–20)
CO2: 23 mmol/L (ref 22–32)
Calcium: 8.1 mg/dL — ABNORMAL LOW (ref 8.9–10.3)
Chloride: 108 mmol/L (ref 98–111)
Creatinine, Ser: 0.36 mg/dL — ABNORMAL LOW (ref 0.44–1.00)
GFR, Estimated: >60 mL/min (ref >60)
Glucose, Bld: 137 mg/dL — ABNORMAL HIGH (ref 70–99)
Potassium: 4.4 mmol/L (ref 3.5–5.1)
Sodium: 136 mmol/L (ref 135–145)
Total Bilirubin: 0.5 mg/dL (ref 0.3–1.2)
Total Protein: 6.3 g/dL — ABNORMAL LOW (ref 6.5–8.1)

## 2022-09-22 LAB — GLUCOSE, CAPILLARY
Glucose-Capillary: 135 mg/dL — ABNORMAL HIGH (ref 70–99)
Glucose-Capillary: 148 mg/dL — ABNORMAL HIGH (ref 70–99)
Glucose-Capillary: 127 mg/dL — ABNORMAL HIGH (ref 70–99)
Glucose-Capillary: 77 mg/dL (ref 70–99)
Glucose-Capillary: 103 mg/dL — ABNORMAL HIGH (ref 70–99)
Glucose-Capillary: 128 mg/dL — ABNORMAL HIGH (ref 70–99)
Glucose-Capillary: 101 mg/dL — ABNORMAL HIGH (ref 70–99)
Glucose-Capillary: 142 mg/dL — ABNORMAL HIGH (ref 70–99)
Glucose-Capillary: 117 mg/dL — ABNORMAL HIGH (ref 70–99)
Glucose-Capillary: 81 mg/dL (ref 70–99)
Glucose-Capillary: 139 mg/dL — ABNORMAL HIGH (ref 70–99)
Glucose-Capillary: 137 mg/dL — ABNORMAL HIGH (ref 70–99)

## 2022-09-22 LAB — PHOSPHORUS: Phosphorus: 2.9 mg/dL (ref 2.5–4.6)

## 2022-09-22 LAB — MAGNESIUM: Magnesium: 2 mg/dL (ref 1.7–2.4)

## 2022-09-22 LAB — TRIGLYCERIDES: Triglycerides: 70 mg/dL (ref <150)

## 2022-09-22 MED ORDER — ZINC CHLORIDE 1 MG/ML IV SOLN
INTRAVENOUS | Status: AC
  Filled 2022-09-22: qty 672

## 2022-09-22 NOTE — Progress Notes (Signed)
[facility] SURGICAL PROGRESS NOTE (cpt [phone])  Hospital Day(s): 221.   Post op day(s): 219 Days Post-Op.   Interval History:  Patient seen and examined Remains without fever Port Tip Cx with staph epidermis; completed vancomycin  Bi-weekly nutritional labs pending this AM [name] output 450 ccs; managing effluent independently She is on CLD currently On TPN  Review of Systems:  Constitutional: denies fever, chills  HEENT: denies cough or congestion  Respiratory: denies any shortness of breath  Cardiovascular: denies chest pain or palpitations  Gastrointestinal: + Abdominal pain, denies N/V Genitourinary: denies burning with urination or urinary frequency Integumentary: + midline wound (stable)   Vital signs in last 24 hours: [min-max] current  Temp:  [97.6 F (36.4 C)-98.3 F (36.8 C)] 98.3 F (36.8 C) (01/15 0504) Pulse Rate:  [69-79] 79 (01/15 0504) Resp:  [18-20] 20 (01/15 0504) BP: (91-121)/(51-74) 97/54 (01/15 0504) SpO2:  [98 %-100 %] 98 % (01/15 0504)     Height: '4\' 11"'$  (149.9 cm) Weight: 98.9 kg BMI (Calculated): 43.04   Intake/Output last 2 shifts:  01/14 0701 - 01/15 0700 In: 1208.4 [P.O.:240; I.V.:718.4; IV Piggyback:250] Out: 450    Physical Exam:  Constitutional: alert, cooperative and no distress  Respiratory: breathing non-labored at rest  Cardiovascular: regular rate and sinus rhythm  Gastrointestinal: Soft, rather unchanged suprapubic tenderness (mild), non-distended, no rebound/guarding. Integumentary:  Port removal site in right chest is healing via secondary intention; no erythema  Midline wound open, peritoneum closed; granulating; there are three stomatized areas visible in the LUQ and LLQ portions of the wound, output remains feculent. There is hypergranulation tissue to the inferior wound edge.     Labs:     Latest Ref Rng & Units 09/13/2022    3:37 PM 09/11/2022    8:56 AM 08/11/2022    4:23 AM  CBC  WBC 4.0 - 10.5 K/uL  3.5  6.3  4.8   Hemoglobin 12.0 - 15.0 g/dL 7.3  8.2  7.8   Hematocrit 36.0 - 46.0 % 23.3  26.3  24.1   Platelets 150 - 400 K/uL 89  136  138       Latest Ref Rng & Units 09/20/2022    2:27 PM 09/18/2022   10:15 AM 09/15/2022    8:32 AM  CMP  Glucose 70 - 99 mg/dL 116  170  196   BUN 6 - 20 mg/dL 20  21  38   Creatinine 0.44 - 1.00 mg/dL 0.48  0.44  0.56   Sodium 135 - 145 mmol/L 136  135  134   Potassium 3.5 - 5.1 mmol/L 4.0  3.9  4.6   Chloride 98 - 111 mmol/L 109  114  109   CO2 22 - 32 mmol/L '21  17  18   '$ Calcium 8.9 - 10.3 mg/dL 8.0  7.8  8.6   Total Protein 6.5 - 8.1 g/dL  5.7  6.5   Total Bilirubin 0.3 - 1.2 mg/dL  0.5  0.6   Alkaline Phos 38 - 126 U/L  155  160   AST 15 - 41 U/L  30  58   ALT 0 - 44 U/L  33  45     Imaging studies: No new pertinent imaging studies this AM   Assessment/Plan:  57 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   - New 01/15:   - Continue daily dressing change of right chest port site; okay to cover with Band-Aid or gauze with tape  - Completed IV Abx for now (Vancomycin); no need to restart/continue crrently  - Wound Care ([name] Pouch); Appreciate WOC RN assistance. No longer on intermittent or continuous suction. Patient seems to like this better and has started to manage effluent independently. She will need intermittent suction with Yankauer to control effluent. Likely be beneficial to suction before ambulation.    - Resumed TPN; nutritional labs pending this morning - Appreciate dietary assistance with trace elements; iron supplementation            - Monitor abdominal examination; on-going bowel function            - Pain control prn; antemetic prn            - Progressed  with therapies; no longer any recommendations  - Appreciate neurosurgery assistance with compression fracture; serial XRs   - Discharge Planning: Continue to anticipate lengthy admission +/- possible transfer; no new progress regarding disposition unfortunately   All of the above findings and recommendations were discussed with the patient, and the medical team, and all of patient's questions were answered to her expressed satisfaction.  -- [name], PA-C [facility] 09/22/2022, 7:19 AM M-F: 7am - 4pm

## 2022-09-22 NOTE — Progress Notes (Signed)
PHARMACY - TOTAL PARENTERAL NUTRITION CONSULT NOTE   Indication: Prolonged ileus  Patient Measurements: Height: '4\' 11"'$  (149.9 cm) Weight: 98.9 kg (218 lb 0.6 oz) IBW/kg (Calculated) : 43.2 TPN AdjBW (KG): 55.2 Body mass index is 44.04 kg/m.  Assessment: Debra [name] is a 57 y.o. female s/p laparotomy, excision of greater omental mass, abdominal wall reconstruction with [name], appendectomy, and placement of Prevena vac.  Glucose / Insulin:  --No apparent history of diabetes --BG 103 - 146 mg/dL   --rSSI 4x/day  --SSI: 6u last 24h  20 units insulin in TPN Electrolytes: WNL Renal: SCr stable at approximate baseline  Hepatic: LFTs wnl --bilirubin wnl GI Imaging: 9/11 CTAP: no new acute issues GI Surgeries / Procedures: s/p laparotomy, excision of greater omental mass, abdominal wall reconstruction with [name], appendectomy, and placement of Prevena vac  Central access: 02/15/22 TPN start date: 02/15/22  Nutritional Goals: Goal cyclic TPN over 16 hrs: cyclic TPN over 16 hrs: (provides 102 g of protein and 1811 kcals per day) total volume over 16hrs for calculations=1400 ml (1500 ml total with overfill)  RD Assessment:  Estimated Needs Total Energy Estimated Needs: 1800-2100kcal/day Total Protein Estimated Needs: 90-110g/d Total Fluid Estimated Needs: 1.4-1.6L/day  Current Nutrition:  CLD + nutritional supplements, not meeting PO needs yet   Plan: Transitioned to *Cyclic* TPN on 9/38.   to run over 16 hours per MD request. [name] at 2000 per discussion with Dietician to allow patient time for walking in afternoon/evenings.  To run over 16 hours: -Start rate at 49 mL/hr for 1 hour. -Increase rate to 98 mL/hr for 13 hours.  -Decrease rate to 49 mL/hr for 1 hour. -Decrease rate to 25 mL/hr for 1 hour, then stop.  Plan:  See cyclic TPN rate above Continue Nutritional Components Amino acids (using 15% Clinisol): 101 g Dextrose 19% = 266  g Lipids (using 20% SMOFlipids): 50.4 g kCal: 1817/24h  Electrolytes in TPN: Na 46mq/L, K 42m/L, Ca 38m57mL, Mg 10 mEq/L, Phos 10 mmol/L, Cl:Ac 1:2 Chloride elevated and CO2 is low, will change Cl:Ac ratio to 1:2 (09/18/22) Continue MVI, trace elements, chromium and zinc per dietary recs. Continue CBG/SSI for Cyclic TPN:  -CBG 2 hrs after cyclic TPN start -CBG during middle of cyclic TPN infusion -CBG 1 hr after cylic TPN stopped -CBG while off TPN Continue resistant SSI + 20u insulin in TPN .  Check TPN labs on Mon/Thurs at minimum Monitor for further need of diuresis and adjust as needed for fluid status zinc 5 mg added to TPN 12/16 per dietician recommendation.   CarWynelle ClevelandharmD, BCPS Clinical Pharmacist 09/22/2022 7:44 AM

## 2022-09-22 NOTE — Consult Note (Addendum)
[place] Nurse wound follow up Pouch was last changed 1/14 by the bedside nurse. Current [name] pouch is intact with good seal over abd full thickness wound with fistulas; mod amt green liquid. Patient is performing intermittent suction by inserting yankour periodically. Supplies at bedside and instructions have been provided if they need to change the pouch PRN if leaking.  Thank-you,  Julien Girt MSN, [place], [place], [place], [place]

## 2022-09-23 LAB — GLUCOSE, CAPILLARY
Glucose-Capillary: 144 mg/dL — ABNORMAL HIGH (ref 70–99)
Glucose-Capillary: 119 mg/dL — ABNORMAL HIGH (ref 70–99)
Glucose-Capillary: 77 mg/dL (ref 70–99)
Glucose-Capillary: 80 mg/dL (ref 70–99)
Glucose-Capillary: 139 mg/dL — ABNORMAL HIGH (ref 70–99)

## 2022-09-23 MED ORDER — ZINC CHLORIDE 1 MG/ML IV SOLN
INTRAVENOUS | Status: AC
  Filled 2022-09-23: qty 672

## 2022-09-23 NOTE — Progress Notes (Signed)
PHARMACY - TOTAL PARENTERAL NUTRITION CONSULT NOTE   Indication: Prolonged ileus  Patient Measurements: Height: '4\' 11"'$  (149.9 cm) Weight: 98.9 kg (218 lb 0.6 oz) IBW/kg (Calculated) : 43.2 TPN AdjBW (KG): 55.2 Body mass index is 44.04 kg/m.  Assessment: Debra [name] is a 57 y.o. female s/p laparotomy, excision of greater omental mass, abdominal wall reconstruction with [name], appendectomy, and placement of Prevena vac.  Glucose / Insulin:  --No apparent history of diabetes --BG 119 - 144 mg/dL   --rSSI 4x/day  --SSI: 6u last 24h  20 units insulin in TPN Electrolytes: WNL Renal: SCr stable at approximate baseline  Hepatic: LFTs wnl --bilirubin wnl GI Imaging: 9/11 CTAP: no new acute issues GI Surgeries / Procedures: s/p laparotomy, excision of greater omental mass, abdominal wall reconstruction with [name], appendectomy, and placement of Prevena vac  Central access: 02/15/22 TPN start date: 02/15/22  Nutritional Goals: Goal cyclic TPN over 16 hrs: cyclic TPN over 16 hrs: (provides 102 g of protein and 1811 kcals per day) total volume over 16hrs for calculations=1400 ml (1500 ml total with overfill)  RD Assessment:  Estimated Needs Total Energy Estimated Needs: 1800-2100kcal/day Total Protein Estimated Needs: 90-110g/d Total Fluid Estimated Needs: 1.4-1.6L/day  Current Nutrition:  CLD + nutritional supplements, not meeting PO needs yet   Plan: Transitioned to *Cyclic* TPN on 7/07.   to run over 16 hours per MD request. [name] at 2000 per discussion with Dietician to allow patient time for walking in afternoon/evenings.  To run over 16 hours: -Start rate at 49 mL/hr for 1 hour. -Increase rate to 98 mL/hr for 13 hours.  -Decrease rate to 49 mL/hr for 1 hour. -Decrease rate to 25 mL/hr for 1 hour, then stop.  Plan:  See cyclic TPN rate above Continue Nutritional Components Amino acids (using 15% Clinisol): 101 g Dextrose 19% = 266  g Lipids (using 20% SMOFlipids): 50.4 g kCal: 1817/24h  Electrolytes in TPN: Na 73mq/L, K 417m/L, Ca 74m89mL, Mg 10 mEq/L, Phos 10 mmol/L, Cl:Ac 1:2 (plan to adjust back to 1:1 tomorrow) Chloride elevated and CO2 is low, will change Cl:Ac ratio to 1:2 (09/18/22) Continue MVI, trace elements, chromium and zinc per dietary recs. Continue CBG/SSI for Cyclic TPN:  -CBG 2 hrs after cyclic TPN start -CBG during middle of cyclic TPN infusion -CBG 1 hr after cylic TPN stopped -CBG while off TPN Continue resistant SSI + 20u insulin in TPN .  Check TPN labs on Mon/Thurs at minimum Monitor for further need of diuresis and adjust as needed for fluid status zinc 5 mg added to TPN 12/16 per dietician recommendation.   CarWynelle ClevelandharmD, BCPS Clinical Pharmacist 09/23/2022 7:31 AM

## 2022-09-23 NOTE — Progress Notes (Signed)
Nutrition Follow-up  DOCUMENTATION CODES:   Obesity unspecified  INTERVENTION:   Continue cyclic TPN per pharmacy (16 hrs)- provides 1811kcal/day and 102g/day protein   Ensure Enlive po TID, each supplement provides 350 kcal and 20 grams of protein.  MVI, chromium and trace elements in TPN   Daily weights   Vitamin D2- 50,000 units po weekly   Decrease Zinc to '2mg'$  daily added to TPN   Check urine iodine level   Will recheck routine vitamin labs 2/8   NUTRITION DIAGNOSIS:   Increased nutrient needs related to wound healing, catabolic illness as evidenced by estimated needs.  GOAL:   Patient will meet greater than or equal to 90% of their needs -met with TPN   MONITOR:   PO intake, Supplement acceptance, Labs, Weight trends, Diet advancement, I & O's, TPN  ASSESSMENT:   57 y/o female with h/o hypothyroidism, COVID 19 (3/21), kidney stones and stage 3 colon cancer (s/p left hemicolectomy 5/21 and chemotherapy) who is admitted for new pelvic mass now s/p laparotomy 6/8 (with excision of pelvic mass from greater omentum, abdominal wall reconstruction with bilateral myocutaneous flaps and mesh, incisional hernia repair, appendectomy repair and VAC placement) complicated by bowel perforation s/p reopening of recent laparotomy 6/10 (with repair of small bowel perforation, excision of mesh, placement of two phasix mesh and VAC placement). Pathology returned as metastatic adenocarcinoma. Pt with L1 compression fracture.   Pt continues to tolerate TPN well at goal rate; TPN is currently being cycled for 16 hrs. Triglycerides wnl and are being checked monthly. Hyperglycemia improved with insulin in TPN. Pt up ~21lbs since admission; PA aware. Vitamin labs mostly wnl. Zinc improved; will decrease the amount being added to the TPN. Pt's iodine remains elevated; will check urine iodine levels. Will check routine vitamin labs every two months as pt on chronic TPN. Pt continues to have poor  oral intake; pt eating <25% of meals. [name] pouch remains in place with 1054m output. No discharge plan in place.   Medications reviewed and include: D2, insulin, synthroid, protonix, TPN  -Selenium 93(L), Chromium 2.3(H), copper 118 wnl, Manganese 18.6(H) - 12/7 -zinc 95 wnl, vitamin D 49.04 wnl, Iodine 450.1(H)- 1/9 -Iron 32.0 wnl, TIBC 330, ferritin 54 wnl, folate 14.4 wnl, B12 769- 12/7 -Vitamin B1- 91.2, vitamin A- 21.5, B6- 22.8 wnl, vitamin C- 0.5, vitamin E- 12.7 wnl, vitamin K 0.82- 12/7   Labs reviewed: Na 136 wnl, K 4.4 wnl, creat 0.36(L), P 2.9 wnl, Mg 2.0 wnl- 1/15 Triglycerides- 70-12/15 Cbgs- 119, 144 x 24 hrs  Diet Order:    Diet Order             Diet clear liquid Room service appropriate? Yes; Fluid consistency: Thin  Diet effective now                  EDUCATION NEEDS:   Not appropriate for education at this time  Skin:  Skin Assessment: Reviewed RN Assessment ( Midline wound: 11cm x 12cm )  Last BM:  1/16  Height:   Ht Readings from Last 1 Encounters:  03/01/22 '4\' 11"'$  (1.499 m)    Weight:   Wt Readings from Last 1 Encounters:  09/20/22 98.9 kg    Ideal Body Weight:  44.3 kg  BMI:  Body mass index is 44.04 kg/m.  Estimated Nutritional Needs:   Kcal:  1800-2100kcal/day  Protein:  90-110g/d  Fluid:  1.4-1.6L/day  CKoleen DistanceMS, RD, LDN Please refer to [facility]icsfor RD and/or RD  on-call/weekend/after hours pager

## 2022-09-23 NOTE — TOC Progression Note (Signed)
Transition of Care [facility]) - Progression Note    Patient Details  Name: Debra [name] MRN: [id number] Date of Birth: 27-Jun-1966  Transition of Care [facility]) CM/SW Contact  Beverly Sessions, RN Phone Number: 09/23/2022, 2:08 PM  Clinical Narrative:     Per PA no new progress regarding disposition   Expected Discharge Plan: [place] Barriers to Discharge: Continued Medical Work up  Expected Discharge Plan and Services                                               Social Determinants of Health (SDOH) Interventions SDOH Screenings   Food Insecurity: No Food Insecurity (05/20/2022)  Housing: Low Risk  (05/20/2022)  Transportation Needs: No Transportation Needs (05/20/2022)  Utilities: Not At Risk (05/20/2022)  Tobacco Use: Low Risk  (09/12/2022)    Readmission Risk Interventions     No data to display

## 2022-09-23 NOTE — Plan of Care (Signed)
  Problem: Clinical Measurements: Goal: Will remain free from infection Outcome: Progressing   Problem: Nutrition: Goal: Adequate nutrition will be maintained Outcome: Progressing   Problem: Activity: Goal: Risk for activity intolerance will decrease Outcome: Progressing   Problem: Nutrition: Goal: Adequate nutrition will be maintained Outcome: Progressing   Problem: Coping: Goal: Level of anxiety will decrease Outcome: Progressing   Problem: Pain Managment: Goal: General experience of comfort will improve Outcome: Progressing

## 2022-09-23 NOTE — Progress Notes (Signed)
[facility] SURGICAL PROGRESS NOTE (cpt [phone])  Hospital Day(s): 222.   Post op day(s): 220 Days Post-Op.   Interval History:  Patient seen and examined Remains without fever Port Tip Cx with staph epidermis; completed vancomycin  No new labs this AM [name] output 100 ccs; managing effluent independently She is on CLD currently On TPN  Review of Systems:  Constitutional: denies fever, chills  HEENT: denies cough or congestion  Respiratory: denies any shortness of breath  Cardiovascular: denies chest pain or palpitations  Gastrointestinal: + Abdominal pain, denies N/V Genitourinary: denies burning with urination or urinary frequency Integumentary: + midline wound (stable)   Vital signs in last 24 hours: [min-max] current  Temp:  [98.1 F (36.7 C)-98.2 F (36.8 C)] 98.1 F (36.7 C) (01/16 0550) Pulse Rate:  [72-86] 86 (01/16 0550) Resp:  [18-20] 18 (01/16 0550) BP: (103-124)/(53-61) 124/61 (01/16 0550) SpO2:  [99 %] 99 % (01/15 1546)     Height: '4\' 11"'$  (149.9 cm) Weight: 98.9 kg BMI (Calculated): 43.04   Intake/Output last 2 shifts:  01/15 0701 - 01/16 0700 In: 1011.2 [I.V.:1011.2] Out: 1850 [Urine:850; Drains:1000]   Physical Exam:  Constitutional: alert, cooperative and no distress  Respiratory: breathing non-labored at rest  Cardiovascular: regular rate and sinus rhythm  Gastrointestinal: Soft, rather unchanged suprapubic tenderness (mild), non-distended, no rebound/guarding. Integumentary:  Port removal site in right chest is healing via secondary intention; no erythema  Midline wound open, peritoneum closed; granulating; there are three stomatized areas visible in the LUQ and LLQ portions of the wound, output remains feculent. There is hypergranulation tissue to the inferior wound edge.     Labs:     Latest Ref Rng & Units 09/13/2022    3:37 PM 09/11/2022    8:56 AM 08/11/2022    4:23 AM  CBC  WBC 4.0 - 10.5 K/uL 3.5  6.3  4.8   Hemoglobin  12.0 - 15.0 g/dL 7.3  8.2  7.8   Hematocrit 36.0 - 46.0 % 23.3  26.3  24.1   Platelets 150 - 400 K/uL 89  136  138       Latest Ref Rng & Units 09/22/2022   11:53 AM 09/20/2022    2:27 PM 09/18/2022   10:15 AM  CMP  Glucose 70 - 99 mg/dL 137  116  170   BUN 6 - 20 mg/dL '20  20  21   '$ Creatinine 0.44 - 1.00 mg/dL 0.36  0.48  0.44   Sodium 135 - 145 mmol/L 136  136  135   Potassium 3.5 - 5.1 mmol/L 4.4  4.0  3.9   Chloride 98 - 111 mmol/L 108  109  114   CO2 22 - 32 mmol/L '23  21  17   '$ Calcium 8.9 - 10.3 mg/dL 8.1  8.0  7.8   Total Protein 6.5 - 8.1 g/dL 6.3   5.7   Total Bilirubin 0.3 - 1.2 mg/dL 0.5   0.5   Alkaline Phos 38 - 126 U/L 127   155   AST 15 - 41 U/L 28   30   ALT 0 - 44 U/L 21   33     Imaging studies: No new pertinent imaging studies this AM   Assessment/Plan:  57 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   - New 01/16: No new acute issues  - Wound Care ([name] Pouch); Changed 01/14; Appreciate WOC RN assistance. No longer on intermittent or continuous suction. Patient seems to like this better and has started to manage effluent independently. She will need intermittent suction with Yankauer to control effluent. Likely be beneficial to suction before ambulation.    - Continue daily dressing change of right chest port site; okay to cover with Band-Aid or gauze with tape  - Resumed TPN; nutritional labs pending this morning - Appreciate dietary assistance with trace elements; iron supplementation            - Monitor abdominal examination; on-going bowel function            - Pain control prn; antemetic prn            - Progressed with therapies; no longer any recommendations  - Appreciate neurosurgery  assistance with compression fracture; serial XRs   - Discharge Planning: Continue to anticipate lengthy admission +/- possible transfer; no new progress regarding disposition unfortunately   All of the above findings and recommendations were discussed with the patient, and the medical team, and all of patient's questions were answered to her expressed satisfaction.  -- [name], PA-C [facility] 09/23/2022, 6:59 AM M-F: 7am - 4pm

## 2022-09-24 LAB — GLUCOSE, CAPILLARY
Glucose-Capillary: 165 mg/dL — ABNORMAL HIGH (ref 70–99)
Glucose-Capillary: 107 mg/dL — ABNORMAL HIGH (ref 70–99)
Glucose-Capillary: 96 mg/dL (ref 70–99)
Glucose-Capillary: 121 mg/dL — ABNORMAL HIGH (ref 70–99)

## 2022-09-24 MED ORDER — ZINC CHLORIDE 1 MG/ML IV SOLN
INTRAVENOUS | Status: AC
  Filled 2022-09-24: qty 672

## 2022-09-24 NOTE — Progress Notes (Signed)
[facility] SURGICAL PROGRESS NOTE (cpt [phone])  Hospital Day(s): 223.   Post op day(s): 221 Days Post-Op.   Interval History:  Patient seen and examined Port Tip Cx with staph epidermis; completed vancomycin  No new labs this AM [name] output 1200 ccs; managing effluent independently She is on CLD currently On TPN  Review of Systems:  Constitutional: denies fever, chills  HEENT: denies cough or congestion  Respiratory: denies any shortness of breath  Cardiovascular: denies chest pain or palpitations  Gastrointestinal: + Abdominal pain, denies N/V Genitourinary: denies burning with urination or urinary frequency Integumentary: + midline wound (stable)   Vital signs in last 24 hours: [min-max] current  Temp:  [98.1 F (36.7 C)-98.9 F (37.2 C)] 98.2 F (36.8 C) (01/17 0728) Pulse Rate:  [71-81] 72 (01/17 0728) Resp:  [16-18] 16 (01/17 0728) BP: (87-106)/(52-65) 100/52 (01/17 0728) SpO2:  [95 %-100 %] 95 % (01/17 0728)     Height: '4\' 11"'$  (149.9 cm) Weight: 98.9 kg BMI (Calculated): 43.04   Intake/Output last 2 shifts:  01/16 0701 - 01/17 0700 In: 969.6 [I.V.:969.6] Out: 2400 [Urine:1200; Drains:1200]   Physical Exam:  Constitutional: alert, cooperative and no distress  Respiratory: breathing non-labored at rest  Cardiovascular: regular rate and sinus rhythm  Gastrointestinal: Soft, rather unchanged suprapubic tenderness (mild), non-distended, no rebound/guarding. Integumentary:  Port removal site in right chest is healing via secondary intention; no erythema. Midline wound open, peritoneum closed; granulating; there are three stomatized areas visible in the LUQ and LLQ portions of the wound, output remains feculent. There is hypergranulation tissue to the inferior wound edge.    Labs:     Latest Ref Rng & Units 09/13/2022    3:37 PM 09/11/2022    8:56 AM 08/11/2022    4:23 AM  CBC  WBC 4.0 - 10.5 K/uL 3.5  6.3  4.8   Hemoglobin 12.0 - 15.0 g/dL 7.3   8.2  7.8   Hematocrit 36.0 - 46.0 % 23.3  26.3  24.1   Platelets 150 - 400 K/uL 89  136  138       Latest Ref Rng & Units 09/22/2022   11:53 AM 09/20/2022    2:27 PM 09/18/2022   10:15 AM  CMP  Glucose 70 - 99 mg/dL 137  116  170   BUN 6 - 20 mg/dL '20  20  21   '$ Creatinine 0.44 - 1.00 mg/dL 0.36  0.48  0.44   Sodium 135 - 145 mmol/L 136  136  135   Potassium 3.5 - 5.1 mmol/L 4.4  4.0  3.9   Chloride 98 - 111 mmol/L 108  109  114   CO2 22 - 32 mmol/L '23  21  17   '$ Calcium 8.9 - 10.3 mg/dL 8.1  8.0  7.8   Total Protein 6.5 - 8.1 g/dL 6.3   5.7   Total Bilirubin 0.3 - 1.2 mg/dL 0.5   0.5   Alkaline Phos 38 - 126 U/L 127   155   AST 15 - 41 U/L 28   30   ALT 0 - 44 U/L 21   33     Imaging studies: No new pertinent imaging studies this AM   Assessment/Plan:  57 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   -  New 01/17: No new acute issues. Will advance to FLD  - Wound Care ([name] Pouch); Changed 01/14; Appreciate WOC RN assistance. No longer on intermittent or continuous suction. Patient seems to like this better and has started to manage effluent independently. She will need intermittent suction with Yankauer to control effluent. Likely be beneficial to suction before ambulation.    - Continue daily dressing change of right chest port site; okay to cover with Band-Aid or gauze with tape  - Resumed TPN; nutritional labs pending this morning - Appreciate dietary assistance with trace elements; iron supplementation            - Monitor abdominal examination; on-going bowel function            - Pain control prn; antemetic prn            - Progressed with therapies; no longer any recommendations  - Appreciate neurosurgery  assistance with compression fracture; serial XRs   - Discharge Planning: Continue to anticipate lengthy admission +/- possible transfer; no new progress regarding disposition unfortunately   All of the above findings and recommendations were discussed with the patient, and the medical team, and all of patient's questions were answered to her expressed satisfaction.  -- [name], PA-C [facility] 09/24/2022, 7:29 AM M-F: 7am - 4pm

## 2022-09-24 NOTE — Progress Notes (Signed)
PHARMACY - TOTAL PARENTERAL NUTRITION CONSULT NOTE   Indication: Prolonged ileus  Patient Measurements: Height: '4\' 11"'$  (149.9 cm) Weight: 98.9 kg (218 lb 0.6 oz) IBW/kg (Calculated) : 43.2 TPN AdjBW (KG): 55.2 Body mass index is 44.04 kg/m.  Assessment: Debra [name] is a 56 y.o. female s/p laparotomy, excision of greater omental mass, abdominal wall reconstruction with [name], appendectomy, and placement of Prevena vac.  Glucose / Insulin:  --No apparent history of diabetes --BG 119 - 165 mg/dL   --rSSI 4x/day  --SSI: 7u last 24h  20 units insulin in TPN Electrolytes: WNL Renal: SCr stable at approximate baseline  Hepatic: LFTs wnl --bilirubin wnl GI Imaging: 9/11 CTAP: no new acute issues GI Surgeries / Procedures: s/p laparotomy, excision of greater omental mass, abdominal wall reconstruction with [name], appendectomy, and placement of Prevena vac  Central access: 02/15/22 TPN start date: 02/15/22  Nutritional Goals: Goal cyclic TPN over 16 hrs: cyclic TPN over 16 hrs: (provides 102 g of protein and 1811 kcals per day) total volume over 16hrs for calculations=1400 ml (1500 ml total with overfill)  RD Assessment:  Estimated Needs Total Energy Estimated Needs: 1800-2100kcal/day Total Protein Estimated Needs: 90-110g/d Total Fluid Estimated Needs: 1.4-1.6L/day  Current Nutrition:  CLD + nutritional supplements, not meeting PO needs yet   Plan: Transitioned to *Cyclic* TPN on 4/23.   to run over 16 hours per MD request. [name] at 2000 per discussion with Dietician to allow patient time for walking in afternoon/evenings.  To run over 16 hours: -Start rate at 49 mL/hr for 1 hour. -Increase rate to 98 mL/hr for 13 hours.  -Decrease rate to 49 mL/hr for 1 hour. -Decrease rate to 25 mL/hr for 1 hour, then stop.  Plan:  See cyclic TPN rate above Continue Nutritional Components Amino acids (using 15% Clinisol): 101 g Dextrose 19% = 266  g Lipids (using 20% SMOFlipids): 50.4 g kCal: 1817/24h  Electrolytes in TPN: Na 77mq/L, K 41m/L, Ca 13m30mL, Mg 10 mEq/L, Phos 10 mmol/L Chloride elevated and CO2 is low, will change Cl:Ac ratio to 1:2 (09/18/22) Continue MVI, trace elements, chromium and zinc per dietary recs. Continue CBG/SSI for Cyclic TPN:  -CBG 2 hrs after cyclic TPN start -CBG during middle of cyclic TPN infusion -CBG 1 hr after cylic TPN stopped -CBG while off TPN Continue resistant SSI + 20u insulin in TPN .  Check TPN labs on Mon/Thurs at minimum Monitor for further need of diuresis and adjust as needed for fluid status zinc 5 mg added to TPN 12/16 per dietician recommendation.   [name] Clinical Pharmacist 09/24/2022 11:02 AM

## 2022-09-25 LAB — TRIGLYCERIDES: Triglycerides: 71 mg/dL (ref <150)

## 2022-09-25 LAB — COMPREHENSIVE METABOLIC PANEL
ALT: 17 U/L (ref 0–44)
AST: 23 U/L (ref 15–41)
Albumin: 2.4 g/dL — ABNORMAL LOW (ref 3.5–5.0)
Alkaline Phosphatase: 125 U/L (ref 38–126)
Anion gap: 5 (ref 5–15)
BUN: 20 mg/dL (ref 6–20)
CO2: 27 mmol/L (ref 22–32)
Calcium: 8.3 mg/dL — ABNORMAL LOW (ref 8.9–10.3)
Chloride: 105 mmol/L (ref 98–111)
Creatinine, Ser: 0.41 mg/dL — ABNORMAL LOW (ref 0.44–1.00)
GFR, Estimated: >60 mL/min (ref >60)
Glucose, Bld: 151 mg/dL — ABNORMAL HIGH (ref 70–99)
Potassium: 4 mmol/L (ref 3.5–5.1)
Sodium: 137 mmol/L (ref 135–145)
Total Bilirubin: 0.5 mg/dL (ref 0.3–1.2)
Total Protein: 6.2 g/dL — ABNORMAL LOW (ref 6.5–8.1)

## 2022-09-25 LAB — CBC
HCT: 19.6 % — ABNORMAL LOW (ref 36.0–46.0)
Hemoglobin: 5.9 g/dL — ABNORMAL LOW (ref 12.0–15.0)
MCH: 27.3 pg (ref 26.0–34.0)
MCHC: 30.1 g/dL (ref 30.0–36.0)
MCV: 90.7 fL (ref 80.0–100.0)
Platelets: 82 10*3/uL — ABNORMAL LOW (ref 150–400)
RBC: 2.16 MIL/uL — ABNORMAL LOW (ref 3.87–5.11)
RDW: 17.8 % — ABNORMAL HIGH (ref 11.5–15.5)
WBC: 2.7 10*3/uL — ABNORMAL LOW (ref 4.0–10.5)
nRBC: 0 % (ref 0.0–0.2)

## 2022-09-25 LAB — GLUCOSE, CAPILLARY
Glucose-Capillary: 144 mg/dL — ABNORMAL HIGH (ref 70–99)
Glucose-Capillary: 115 mg/dL — ABNORMAL HIGH (ref 70–99)
Glucose-Capillary: 82 mg/dL (ref 70–99)
Glucose-Capillary: 125 mg/dL — ABNORMAL HIGH (ref 70–99)

## 2022-09-25 LAB — PHOSPHORUS: Phosphorus: 3.3 mg/dL (ref 2.5–4.6)

## 2022-09-25 LAB — MAGNESIUM: Magnesium: 2 mg/dL (ref 1.7–2.4)

## 2022-09-25 MED ORDER — ZINC CHLORIDE 1 MG/ML IV SOLN
INTRAVENOUS | Status: AC
  Filled 2022-09-25: qty 672

## 2022-09-25 NOTE — Consult Note (Signed)
Needles Nurse wound follow up: Large [name] pouch with window changed by bedside nurse, Minette Brine, related to leaking.  New pattern in the room; abd full thickness wound with fistulas; mod amt green liquid. Small amt bleeding occurred higher on the wound, away from any suctioning which occurred. Patient is performing intermittent suction by inserting yankour periodically. Supplies have been ordered to the  bedside and instructions have been provided if bedside nurses need to change the pouch PRN if leaking.   Thank-you,  Julien Girt MSN, [place], [place], [place], [place]

## 2022-09-25 NOTE — Progress Notes (Signed)
[facility] SURGICAL PROGRESS NOTE (cpt [phone])  Hospital Day(s): 224.   Post op day(s): 222 Days Post-Op.   Interval History:  Patient seen and examined Port Tip Cx with staph epidermis; completed vancomycin  Patient sleeping; no new issues  Nutritional labs are pending this AM [name] output unmeasured today; managing effluent independently Advanced to FLD On TPN  Review of Systems:  Constitutional: denies fever, chills  HEENT: denies cough or congestion  Respiratory: denies any shortness of breath  Cardiovascular: denies chest pain or palpitations  Gastrointestinal: + Abdominal pain, denies N/V Genitourinary: denies burning with urination or urinary frequency Integumentary: + midline wound (stable)   Vital signs in last 24 hours: [min-max] current  Temp:  [97.7 F (36.5 C)-98.2 F (36.8 C)] 97.8 F (36.6 C) (01/18 0422) Pulse Rate:  [71-82] 82 (01/18 0422) Resp:  [16-18] 18 (01/18 0422) BP: (100-110)/(52-66) 106/60 (01/18 0422) SpO2:  [95 %-100 %] 97 % (01/18 0422) Weight:  [98.5 kg] 98.5 kg (01/18 0500)     Height: '4\' 11"'$  (149.9 cm) Weight: 98.5 kg BMI (Calculated): 43.04   Intake/Output last 2 shifts:  01/17 0701 - 01/18 0700 In: 1886.2 [P.O.:240; I.V.:1646.2] Out: -    Physical Exam:  Constitutional: alert, cooperative and no distress  Respiratory: breathing non-labored at rest  Cardiovascular: regular rate and sinus rhythm  Gastrointestinal: Soft, rather unchanged suprapubic tenderness (mild), non-distended, no rebound/guarding. Integumentary:  Port removal site in right chest is healing via secondary intention; no erythema. Midline wound open, peritoneum closed; granulating; there are three stomatized areas visible in the LUQ and LLQ portions of the wound, output remains feculent. There is hypergranulation tissue to the inferior wound edge.    Labs:     Latest Ref Rng & Units 09/13/2022    3:37 PM 09/11/2022    8:56 AM 08/11/2022    4:23 AM   CBC  WBC 4.0 - 10.5 K/uL 3.5  6.3  4.8   Hemoglobin 12.0 - 15.0 g/dL 7.3  8.2  7.8   Hematocrit 36.0 - 46.0 % 23.3  26.3  24.1   Platelets 150 - 400 K/uL 89  136  138       Latest Ref Rng & Units 09/22/2022   11:53 AM 09/20/2022    2:27 PM 09/18/2022   10:15 AM  CMP  Glucose 70 - 99 mg/dL 137  116  170   BUN 6 - 20 mg/dL '20  20  21   '$ Creatinine 0.44 - 1.00 mg/dL 0.36  0.48  0.44   Sodium 135 - 145 mmol/L 136  136  135   Potassium 3.5 - 5.1 mmol/L 4.4  4.0  3.9   Chloride 98 - 111 mmol/L 108  109  114   CO2 22 - 32 mmol/L '23  21  17   '$ Calcium 8.9 - 10.3 mg/dL 8.1  8.0  7.8   Total Protein 6.5 - 8.1 g/dL 6.3   5.7   Total Bilirubin 0.3 - 1.2 mg/dL 0.5   0.5   Alkaline Phos 38 - 126 U/L 127   155   AST 15 - 41 U/L 28   30   ALT 0 - 44 U/L 21   33     Imaging studies: No new pertinent imaging studies this AM   Assessment/Plan:  57 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   - New 01/18: No new issues  - Wound Care ([name] Pouch); Changed 01/14; Appreciate WOC RN assistance. No longer on intermittent or continuous suction. Patient seems to like this better and has started to manage effluent independently. She will need intermittent suction with Yankauer to control effluent. Likely be beneficial to suction before ambulation.    - Continue daily dressing change of right chest port site; okay to cover with Band-Aid or gauze with tape  - Resumed TPN; nutritional labs pending this morning - Appreciate dietary assistance with trace elements; iron supplementation            - Monitor abdominal examination; on-going bowel function            - Pain control prn; antemetic prn            - Progressed with therapies; no longer  any recommendations  - Appreciate neurosurgery assistance with compression fracture; serial XRs   - Discharge Planning: Continue to anticipate lengthy admission +/- possible transfer; no new progress regarding disposition unfortunately   All of the above findings and recommendations were discussed with the patient, and the medical team, and all of patient's questions were answered to her expressed satisfaction.  -- [name], PA-C [facility] 09/25/2022, 7:23 AM M-F: 7am - 4pm

## 2022-09-25 NOTE — Plan of Care (Signed)
  Problem: Clinical Measurements: Goal: Will remain free from infection Outcome: Progressing   Problem: Nutrition: Goal: Adequate nutrition will be maintained Outcome: Progressing   Problem: Activity: Goal: Risk for activity intolerance will decrease Outcome: Progressing   Problem: Nutrition: Goal: Adequate nutrition will be maintained Outcome: Progressing   Problem: Coping: Goal: Level of anxiety will decrease Outcome: Progressing   Problem: Pain Managment: Goal: General experience of comfort will improve Outcome: Progressing

## 2022-09-25 NOTE — Progress Notes (Signed)
PHARMACY - TOTAL PARENTERAL NUTRITION CONSULT NOTE   Indication: Prolonged ileus  Patient Measurements: Height: '4\' 11"'$  (149.9 cm) Weight: 98.5 kg (217 lb 2.5 oz) IBW/kg (Calculated) : 43.2 TPN AdjBW (KG): 55.2 Body mass index is 43.86 kg/m.  Assessment: Debra [name] is a 57 y.o. female s/p laparotomy, excision of greater omental mass, abdominal wall reconstruction with [name], appendectomy, and placement of Prevena vac.  Glucose / Insulin:  --No apparent history of diabetes --BG 119 - 165 mg/dL   --rSSI 4x/day  --SSI: 7u last 24h  20 units insulin in TPN Electrolytes: WNL Renal: SCr stable at approximate baseline  Hepatic: LFTs wnl --bilirubin wnl GI Imaging: 9/11 CTAP: no new acute issues GI Surgeries / Procedures: s/p laparotomy, excision of greater omental mass, abdominal wall reconstruction with [name], appendectomy, and placement of Prevena vac  Central access: 02/15/22 TPN start date: 02/15/22  Nutritional Goals: Goal cyclic TPN over 16 hrs: cyclic TPN over 16 hrs: (provides 102 g of protein and 1811 kcals per day) total volume over 16hrs for calculations=1400 ml (1500 ml total with overfill)  RD Assessment:  Estimated Needs Total Energy Estimated Needs: 1800-2100kcal/day Total Protein Estimated Needs: 90-110g/d Total Fluid Estimated Needs: 1.4-1.6L/day  Current Nutrition:  CLD + nutritional supplements, not meeting PO needs yet   Plan: Transitioned to *Cyclic* TPN on 1/01.   to run over 16 hours per MD request. [name] at 2000 per discussion with Dietician to allow patient time for walking in afternoon/evenings.  To run over 16 hours: -Start rate at 49 mL/hr for 1 hour. -Increase rate to 98 mL/hr for 13 hours.  -Decrease rate to 49 mL/hr for 1 hour. -Decrease rate to 25 mL/hr for 1 hour, then stop.  Plan:  See cyclic TPN rate above Continue Nutritional Components Amino acids (using 15% Clinisol): 101 g Dextrose 19% = 266  g Lipids (using 20% SMOFlipids): 50.4 g kCal: 1817/24h  Electrolytes in TPN: Na 325mq/L, K 486m/L, Ca 25m825mL, Mg 10 mEq/L, Phos 10 mmol/L.  Adjusted ratio 1:1 due to improving CO2 and Chloride Continue MVI, trace elements, chromium and zinc per dietary recs. Continue CBG/SSI for Cyclic TPN:  -CBG 2 hrs after cyclic TPN start -CBG during middle of cyclic TPN infusion -CBG 1 hr after cylic TPN stopped -CBG while off TPN Continue resistant SSI + 20u insulin in TPN .  Check TPN labs on Mon/Thurs at minimum Monitor for further need of diuresis and adjust as needed for fluid status zinc 5 mg added to TPN 12/16 per dietician recommendation.   CarGlean SalvoharmD, BCPS Clinical Pharmacist  09/25/2022 8:00 AM

## 2022-09-25 NOTE — Plan of Care (Signed)
  Problem: Clinical Measurements: Goal: Will remain free from infection Outcome: Progressing   Problem: Nutrition: Goal: Adequate nutrition will be maintained Outcome: Progressing   Problem: Activity: Goal: Risk for activity intolerance will decrease Outcome: Progressing   Problem: Nutrition: Goal: Adequate nutrition will be maintained Outcome: Progressing   Problem: Coping: Goal: Level of anxiety will decrease Outcome: Progressing

## 2022-09-26 ENCOUNTER — Inpatient Hospital Stay: Payer: Medicaid Other

## 2022-09-26 LAB — GLUCOSE, CAPILLARY
Glucose-Capillary: 137 mg/dL — ABNORMAL HIGH (ref 70–99)
Glucose-Capillary: 119 mg/dL — ABNORMAL HIGH (ref 70–99)
Glucose-Capillary: 107 mg/dL — ABNORMAL HIGH (ref 70–99)
Glucose-Capillary: 157 mg/dL — ABNORMAL HIGH (ref 70–99)

## 2022-09-26 LAB — MISC LABCORP TEST (SEND OUT)
LabCorp test name: 83789
Labcorp test code: 70163

## 2022-09-26 MED ORDER — ZINC CHLORIDE 1 MG/ML IV SOLN
INTRAVENOUS | Status: AC
  Filled 2022-09-26: qty 672

## 2022-09-26 NOTE — Progress Notes (Signed)
Patient seen and examined.  Feels a little bit better.  She does have persistent left groin pain.  No fevers no chills On TPN. [name] pouch working well  PE NAD Abd: soft ,  Midline wound open, peritoneum closed; new skin forming there are three stomatized areas visible in the LUQ and LLQ portions of the wound, output remains enteric. There is hypergranulation tissue to the inferior wound edge.  Ext: tenderness Left thigh , no infection, abscess or erythema  A/P Doing ok U/S r/o DVT  Continue supportive care

## 2022-09-26 NOTE — Plan of Care (Signed)
  Problem: Clinical Measurements: Goal: Will remain free from infection Outcome: Progressing   Problem: Nutrition: Goal: Adequate nutrition will be maintained Outcome: Progressing   Problem: Activity: Goal: Risk for activity intolerance will decrease Outcome: Progressing   Problem: Nutrition: Goal: Adequate nutrition will be maintained Outcome: Progressing   Problem: Coping: Goal: Level of anxiety will decrease Outcome: Progressing   Problem: Pain Managment: Goal: General experience of comfort will improve Outcome: Progressing

## 2022-09-26 NOTE — Progress Notes (Signed)
PHARMACY - TOTAL PARENTERAL NUTRITION CONSULT NOTE   Indication: Prolonged ileus  Patient Measurements: Height: '4\' 11"'$  (149.9 cm) Weight: 96.9 kg (213 lb 10 oz) IBW/kg (Calculated) : 43.2 TPN AdjBW (KG): 55.2 Body mass index is 43.15 kg/m.  Assessment: Debra [name] is a 57 y.o. female s/p laparotomy, excision of greater omental mass, abdominal wall reconstruction with [name], appendectomy, and placement of Prevena vac.  Glucose / Insulin:  --No apparent history of diabetes --BG 82 - 151 mg/dL   --rSSI 4x/day  --SSI: 6u last 24h  20 units insulin in TPN Electrolytes: WNL Renal: SCr stable at approximate baseline  Hepatic: LFTs wnl --bilirubin wnl GI Imaging: 9/11 CTAP: no new acute issues GI Surgeries / Procedures: s/p laparotomy, excision of greater omental mass, abdominal wall reconstruction with [name], appendectomy, and placement of Prevena vac  Central access: 02/15/22 TPN start date: 02/15/22  Nutritional Goals: Goal cyclic TPN over 16 hrs: cyclic TPN over 16 hrs: (provides 102 g of protein and 1811 kcals per day) total volume over 16hrs for calculations=1400 ml (1500 ml total with overfill)  RD Assessment:  Estimated Needs Total Energy Estimated Needs: 1800-2100kcal/day Total Protein Estimated Needs: 90-110g/d Total Fluid Estimated Needs: 1.4-1.6L/day  Current Nutrition:  CLD + nutritional supplements, not meeting PO needs yet   Plan: Transitioned to *Cyclic* TPN on 0/56.   to run over 16 hours per MD request. [name] at 2000 per discussion with Dietician to allow patient time for walking in afternoon/evenings.  To run over 16 hours: -Start rate at 49 mL/hr for 1 hour. -Increase rate to 98 mL/hr for 13 hours.  -Decrease rate to 49 mL/hr for 1 hour. -Decrease rate to 25 mL/hr for 1 hour, then stop.  Plan:  See cyclic TPN rate above Continue Nutritional Components Amino acids (using 15% Clinisol): 101 g Dextrose 19% = 266  g Lipids (using 20% SMOFlipids): 50.4 g kCal: 1817/24h  Electrolytes in TPN: Na 30mq/L, K 452m/L, Ca 62m70mL, Mg 10 mEq/L, Phos 10 mmol/L.  Adjusted ratio 1:1 due to improving CO2 and Chloride Continue MVI, trace elements, chromium and zinc per dietary recs. Continue CBG/SSI for Cyclic TPN:  -CBG 2 hrs after cyclic TPN start -CBG during middle of cyclic TPN infusion -CBG 1 hr after cylic TPN stopped -CBG while off TPN Continue resistant SSI + 20u insulin in TPN .  Check TPN labs on Mon/Thurs at minimum Monitor for further need of diuresis and adjust as needed for fluid status zinc 5 mg added to TPN 12/16 per dietician recommendation.   JusAubery LappingharmD Clinical Pharmacist  09/26/2022 7:39 AM

## 2022-09-27 LAB — GLUCOSE, CAPILLARY
Glucose-Capillary: 116 mg/dL — ABNORMAL HIGH (ref 70–99)
Glucose-Capillary: 80 mg/dL (ref 70–99)
Glucose-Capillary: 96 mg/dL (ref 70–99)

## 2022-09-27 LAB — PREPARE RBC (CROSSMATCH)

## 2022-09-27 MED ORDER — SODIUM CHLORIDE 0.9% IV SOLUTION: Freq: Once | INTRAVENOUS | Status: AC

## 2022-09-27 MED ORDER — ZINC CHLORIDE 1 MG/ML IV SOLN
INTRAVENOUS | Status: AC
  Filled 2022-09-27: qty 672

## 2022-09-27 MED ORDER — ALTEPLASE 2 MG IJ SOLR
2.0000 mg | Freq: Once | INTRAMUSCULAR | Status: AC
  Administered 2022-09-27: 2 mg
  Filled 2022-09-27: qty 2

## 2022-09-27 NOTE — Progress Notes (Signed)
PHARMACY - TOTAL PARENTERAL NUTRITION CONSULT NOTE   Indication: Prolonged ileus  Patient Measurements: Height: '4\' 11"'$  (149.9 cm) Weight: 96.7 kg (213 lb 3 oz) IBW/kg (Calculated) : 43.2 TPN AdjBW (KG): 55.2 Body mass index is 43.06 kg/m.  Assessment: Debra [name] is a 57 y.o. female s/p laparotomy, excision of greater omental mass, abdominal wall reconstruction with [name], appendectomy, and placement of Prevena vac.  Glucose / Insulin:  --No apparent history of diabetes --BG 110-150 mg/dL   --rSSI 4x/day  --SSI: 7u last 24h  20 units insulin in TPN Electrolytes: WNL Renal: SCr stable at approximate baseline  Hepatic: LFTs wnl --bilirubin wnl GI Imaging: 9/11 CTAP: no new acute issues GI Surgeries / Procedures: s/p laparotomy, excision of greater omental mass, abdominal wall reconstruction with [name], appendectomy, and placement of Prevena vac  Central access: 02/15/22 TPN start date: 02/15/22  Nutritional Goals: Goal cyclic TPN over 16 hrs: cyclic TPN over 16 hrs: (provides 102 g of protein and 1811 kcals per day) total volume over 16hrs for calculations=1400 ml (1500 ml total with overfill)  RD Assessment:  Estimated Needs Total Energy Estimated Needs: 1800-2100kcal/day Total Protein Estimated Needs: 90-110g/d Total Fluid Estimated Needs: 1.4-1.6L/day  Current Nutrition:  CLD + nutritional supplements, not meeting PO needs yet   Plan: Transitioned to *Cyclic* TPN on 0/09.   to run over 16 hours per MD request. [name] at 2000 per discussion with Dietician to allow patient time for walking in afternoon/evenings.  To run over 16 hours: -Start rate at 49 mL/hr for 1 hour. -Increase rate to 98 mL/hr for 13 hours.  -Decrease rate to 49 mL/hr for 1 hour. -Decrease rate to 25 mL/hr for 1 hour, then stop.  Plan:  See cyclic TPN rate above Continue Nutritional Components Amino acids (using 15% Clinisol): 101 g Dextrose 19% = 266  g Lipids (using 20% SMOFlipids): 50.4 g kCal: 1817/24h  Electrolytes in TPN: Na 19mq/L, K 441m/L, Ca 4m18mL, Mg 10 mEq/L, Phos 10 mmol/L.  Adjusted ratio 1:1 due to improving CO2 and Chloride Continue MVI, trace elements, chromium and zinc per dietary recs. Continue CBG/SSI for Cyclic TPN:  -CBG 2 hrs after cyclic TPN start -CBG during middle of cyclic TPN infusion -CBG 1 hr after cylic TPN stopped -CBG while off TPN Continue resistant SSI + 20u insulin in TPN .  Check TPN labs on Mon/Thurs at minimum Monitor for further need of diuresis and adjust as needed for fluid status zinc 5 mg added to TPN 12/16 per dietician recommendation.   KisEleonore ChiquitoharmD Clinical Pharmacist  09/27/2022 9:48 AM

## 2022-09-27 NOTE — Plan of Care (Signed)
  Problem: Clinical Measurements: Goal: Will remain free from infection Outcome: Progressing   Problem: Nutrition: Goal: Adequate nutrition will be maintained Outcome: Progressing   Problem: Activity: Goal: Risk for activity intolerance will decrease Outcome: Progressing   Problem: Nutrition: Goal: Adequate nutrition will be maintained Outcome: Progressing   Problem: Coping: Goal: Level of anxiety will decrease Outcome: Progressing   Problem: Pain Managment: Goal: General experience of comfort will improve Outcome: Progressing

## 2022-09-27 NOTE — Progress Notes (Signed)
   09/27/22 0500  Provider Notification  Provider Name/Title Dr. Dahlia Byes  Date Provider Notified 09/27/22  Time Provider Notified 0500  Method of Notification  (Secure chat)  Notification Reason Critical Result  Test performed and critical result Hemoglobin 5.9 on 09/25/22  Date Critical Result Received  (unknown if critical was called.)  Time Critical Result Received  (unknown if critical was called.)  Provider response See new orders (Type and screen, crossmatch, transfuse 2 units of PRBC.)  Date of Provider Response 09/27/22  Time of Provider Response [phone]

## 2022-09-27 NOTE — Progress Notes (Signed)
Seen and examined No new issues Dropped in hb will transfused 2 RBC No evidence of dvt Continue current care

## 2022-09-28 LAB — GLUCOSE, CAPILLARY
Glucose-Capillary: 78 mg/dL (ref 70–99)
Glucose-Capillary: 132 mg/dL — ABNORMAL HIGH (ref 70–99)

## 2022-09-28 MED ORDER — ZINC CHLORIDE 1 MG/ML IV SOLN
INTRAVENOUS | Status: AC
  Filled 2022-09-28: qty 672

## 2022-09-28 NOTE — Progress Notes (Signed)
PHARMACY - TOTAL PARENTERAL NUTRITION CONSULT NOTE   Indication: Prolonged ileus  Patient Measurements: Height: '4\' 11"'$  (149.9 cm) Weight: 97.2 kg (214 lb 4.6 oz) IBW/kg (Calculated) : 43.2 TPN AdjBW (KG): 55.2 Body mass index is 43.28 kg/m.  Assessment: Debra [name] is a 57 y.o. female s/p laparotomy, excision of greater omental mass, abdominal wall reconstruction with [name], appendectomy, and placement of Prevena vac.  Glucose / Insulin:  --No apparent history of diabetes --BG 110-150 > 96 mg/dL   --rSSI 4x/day  --SSI: 4u last 24h  20 units insulin in TPN Electrolytes: WNL Renal: SCr stable at approximate baseline  Hepatic: LFTs wnl --bilirubin wnl GI Imaging: 9/11 CTAP: no new acute issues GI Surgeries / Procedures: s/p laparotomy, excision of greater omental mass, abdominal wall reconstruction with [name], appendectomy, and placement of Prevena vac  Central access: 02/15/22 TPN start date: 02/15/22  Nutritional Goals: Goal cyclic TPN over 16 hrs: cyclic TPN over 16 hrs: (provides 102 g of protein and 1811 kcals per day) total volume over 16hrs for calculations=1400 ml (1500 ml total with overfill)  RD Assessment:  Estimated Needs Total Energy Estimated Needs: 1800-2100kcal/day Total Protein Estimated Needs: 90-110g/d Total Fluid Estimated Needs: 1.4-1.6L/day  Current Nutrition:  CLD + nutritional supplements, not meeting PO needs yet   Plan: Transitioned to *Cyclic* TPN on 8/65.   to run over 16 hours per MD request. [name] at 2000 per discussion with Dietician to allow patient time for walking in afternoon/evenings.  To run over 16 hours: -Start rate at 49 mL/hr for 1 hour. -Increase rate to 98 mL/hr for 13 hours.  -Decrease rate to 49 mL/hr for 1 hour. -Decrease rate to 25 mL/hr for 1 hour, then stop.  Plan:  See cyclic TPN rate above Continue Nutritional Components Amino acids (using 15% Clinisol): 101 g Dextrose 19% =  266 g Lipids (using 20% SMOFlipids): 50.4 g kCal: 1817/24h  Electrolytes in TPN: Na 27mq/L, K 414m/L, Ca 29m57mL, Mg 10 mEq/L, Phos 10 mmol/L.  Adjusted ratio 1:1 due to improving CO2 and Chloride Continue MVI, trace elements, chromium and zinc per dietary recs. Continue CBG/SSI for Cyclic TPN:  -CBG 2 hrs after cyclic TPN start -CBG during middle of cyclic TPN infusion -CBG 1 hr after cylic TPN stopped -CBG while off TPN Continue resistant SSI  and decrease insulin in TPN to 17 units Check TPN labs on Mon/Thurs at minimum Monitor for further need of diuresis and adjust as needed for fluid status zinc 5 mg added to TPN 12/16 per dietician recommendation.   KisEleonore ChiquitoharmD Clinical Pharmacist  09/28/2022 8:45 AM

## 2022-09-28 NOTE — Progress Notes (Signed)
Some intermittent left thigh pain no specific cause likely neuropathic W/u negative Transfused rbc w/o reaction Recheck labs and continue current care

## 2022-09-29 LAB — BPAM RBC
Blood Product Expiration Date: 202402242359
Blood Product Expiration Date: 202402252359
ISSUE DATE / TIME: 202401201046
ISSUE DATE / TIME: 202401201432
Unit Type and Rh: 5100
Unit Type and Rh: 5100

## 2022-09-29 LAB — GLUCOSE, CAPILLARY
Glucose-Capillary: 144 mg/dL — ABNORMAL HIGH (ref 70–99)
Glucose-Capillary: 158 mg/dL — ABNORMAL HIGH (ref 70–99)
Glucose-Capillary: 120 mg/dL — ABNORMAL HIGH (ref 70–99)
Glucose-Capillary: 161 mg/dL — ABNORMAL HIGH (ref 70–99)
Glucose-Capillary: 117 mg/dL — ABNORMAL HIGH (ref 70–99)
Glucose-Capillary: 79 mg/dL (ref 70–99)
Glucose-Capillary: 132 mg/dL — ABNORMAL HIGH (ref 70–99)

## 2022-09-29 LAB — CBC
HCT: 29.4 % — ABNORMAL LOW (ref 36.0–46.0)
Hemoglobin: 8.9 g/dL — ABNORMAL LOW (ref 12.0–15.0)
MCH: 27.8 pg (ref 26.0–34.0)
MCHC: 30.3 g/dL (ref 30.0–36.0)
MCV: 91.9 fL (ref 80.0–100.0)
Platelets: 73 10*3/uL — ABNORMAL LOW (ref 150–400)
RBC: 3.2 MIL/uL — ABNORMAL LOW (ref 3.87–5.11)
RDW: 17.5 % — ABNORMAL HIGH (ref 11.5–15.5)
WBC: 2.9 10*3/uL — ABNORMAL LOW (ref 4.0–10.5)
nRBC: 0 % (ref 0.0–0.2)

## 2022-09-29 LAB — COMPREHENSIVE METABOLIC PANEL
ALT: 19 U/L (ref 0–44)
AST: 25 U/L (ref 15–41)
Albumin: 2.5 g/dL — ABNORMAL LOW (ref 3.5–5.0)
Alkaline Phosphatase: 131 U/L — ABNORMAL HIGH (ref 38–126)
Anion gap: 6 (ref 5–15)
BUN: 19 mg/dL (ref 6–20)
CO2: 24 mmol/L (ref 22–32)
Calcium: 8.2 mg/dL — ABNORMAL LOW (ref 8.9–10.3)
Chloride: 107 mmol/L (ref 98–111)
Creatinine, Ser: 0.48 mg/dL (ref 0.44–1.00)
GFR, Estimated: >60 mL/min (ref >60)
Glucose, Bld: 148 mg/dL — ABNORMAL HIGH (ref 70–99)
Potassium: 4.1 mmol/L (ref 3.5–5.1)
Sodium: 137 mmol/L (ref 135–145)
Total Bilirubin: 0.7 mg/dL (ref 0.3–1.2)
Total Protein: 6.4 g/dL — ABNORMAL LOW (ref 6.5–8.1)

## 2022-09-29 LAB — TYPE AND SCREEN
ABO/RH(D): O POS
Antibody Screen: NEGATIVE
Unit division: 0
Unit division: 0

## 2022-09-29 LAB — MAGNESIUM: Magnesium: 1.9 mg/dL (ref 1.7–2.4)

## 2022-09-29 LAB — TRIGLYCERIDES: Triglycerides: 86 mg/dL (ref <150)

## 2022-09-29 LAB — PHOSPHORUS: Phosphorus: 2.9 mg/dL (ref 2.5–4.6)

## 2022-09-29 MED ORDER — PREGABALIN 75 MG PO CAPS
200.0000 mg | ORAL_CAPSULE | Freq: Three times a day (TID) | ORAL | Status: DC
  Administered 2022-09-29 – 2023-05-03 (×617): 200 mg via ORAL
  Filled 2022-09-29 (×632): qty 1

## 2022-09-29 MED ORDER — ZINC CHLORIDE 1 MG/ML IV SOLN
INTRAVENOUS | Status: AC
  Filled 2022-09-29: qty 672

## 2022-09-29 MED ORDER — ALTEPLASE 2 MG IJ SOLR
2.0000 mg | Freq: Once | INTRAMUSCULAR | Status: AC
  Administered 2022-09-29: 2 mg
  Filled 2022-09-29: qty 2

## 2022-09-29 NOTE — Progress Notes (Signed)
PHARMACY - TOTAL PARENTERAL NUTRITION CONSULT NOTE   Indication: Prolonged ileus  Patient Measurements: Height: '4\' 11"'$  (149.9 cm) Weight: 96.1 kg (211 lb 14.4 oz) IBW/kg (Calculated) : 43.2 TPN AdjBW (KG): 55.2 Body mass index is 42.8 kg/m.  Assessment: Debra [name] is a 57 y.o. female s/p laparotomy, excision of greater omental mass, abdominal wall reconstruction with [name], appendectomy, and placement of Prevena vac.  Glucose / Insulin:  --No apparent history of diabetes --BG 78 - 148  --rSSI 4x/day  --SSI: 7u last 24h  17 units insulin in TPN Electrolytes: WNL Renal: SCr stable at approximate baseline  Hepatic: LFTs wnl --bilirubin wnl GI Imaging: 9/11 CTAP: no new acute issues GI Surgeries / Procedures: s/p laparotomy, excision of greater omental mass, abdominal wall reconstruction with [name], appendectomy, and placement of Prevena vac  Central access: 02/15/22 TPN start date: 02/15/22  Nutritional Goals: Goal cyclic TPN over 16 hrs: cyclic TPN over 16 hrs: (provides 102 g of protein and 1811 kcals per day) total volume over 16hrs for calculations=1400 ml (1500 ml total with overfill)  RD Assessment:  Estimated Needs Total Energy Estimated Needs: 1800-2100kcal/day Total Protein Estimated Needs: 90-110g/d Total Fluid Estimated Needs: 1.4-1.6L/day  Current Nutrition:  CLD + nutritional supplements, not meeting PO needs yet   Plan: Transitioned to *Cyclic* TPN on 9/24.   to run over 16 hours per MD request. [name] at 2000 per discussion with Dietician to allow patient time for walking in afternoon/evenings.  To run over 16 hours: -Start rate at 49 mL/hr for 1 hour. -Increase rate to 98 mL/hr for 13 hours.  -Decrease rate to 49 mL/hr for 1 hour. -Decrease rate to 25 mL/hr for 1 hour, then stop.  Plan:  See cyclic TPN rate above Continue Nutritional Components Amino acids (using 15% Clinisol): 101 g Dextrose 19% = 266  g Lipids (using 20% SMOFlipids): 50.4 g kCal: 1817/24h  Electrolytes in TPN: Na 83mq/L, K 457m/L, Ca 42m39mL, Mg 10 mEq/L, Phos 10 mmol/L.  Adjusted ratio 1:1 due to improving CO2 and Chloride Continue MVI, trace elements, chromium and zinc per dietary recs. Continue CBG/SSI for Cyclic TPN:  -CBG 2 hrs after cyclic TPN start -CBG during middle of cyclic TPN infusion -CBG 1 hr after cylic TPN stopped -CBG while off TPN Continue resistant SSI  and continue insulin in TPN to 17 units Check TPN labs on Mon/Thurs at minimum Monitor for further need of diuresis and adjust as needed for fluid status zinc 5 mg added to TPN 12/16 per dietician recommendation.  RodVallery SaharmD, BCPS Clinical Pharmacist  09/29/2022 7:08 AM

## 2022-09-29 NOTE — Progress Notes (Signed)
Patient seen and examined. She responded appropriately to blood transfusion. Some mild transient oozing from the fistula Hemodynamic compromise and actively bleeding. Persistent but improved pain on the left groin.  PLAN Continue TPN and [name] pouch Increase Lyrica to 200 3 times daily Pending dispo

## 2022-09-29 NOTE — Progress Notes (Signed)
Dr Dahlia Byes notified via secure chat drainage from eakin pouch bloody

## 2022-09-30 DIAGNOSIS — Z8719 Personal history of other diseases of the digestive system: Secondary | ICD-10-CM

## 2022-09-30 LAB — GLUCOSE, CAPILLARY
Glucose-Capillary: 139 mg/dL — ABNORMAL HIGH (ref 70–99)
Glucose-Capillary: 110 mg/dL — ABNORMAL HIGH (ref 70–99)
Glucose-Capillary: 85 mg/dL (ref 70–99)
Glucose-Capillary: 101 mg/dL — ABNORMAL HIGH (ref 70–99)

## 2022-09-30 MED ORDER — LIDOCAINE 5 % EX PTCH
2.0000 | MEDICATED_PATCH | Freq: Every day | CUTANEOUS | Status: DC | PRN
  Administered 2022-11-01 – 2023-04-30 (×3): 2 via TRANSDERMAL
  Filled 2022-09-30 (×5): qty 2

## 2022-09-30 MED ORDER — ZINC CHLORIDE 1 MG/ML IV SOLN
INTRAVENOUS | Status: AC
  Filled 2022-09-30: qty 672

## 2022-09-30 MED ORDER — ENSURE ENLIVE PO LIQD
237.0000 mL | Freq: Two times a day (BID) | ORAL | Status: DC
  Administered 2022-10-01 – 2023-04-27 (×101): 237 mL via ORAL

## 2022-09-30 NOTE — Progress Notes (Signed)
Nutrition Follow-up  DOCUMENTATION CODES:   Obesity unspecified  INTERVENTION:   Continue cyclic TPN per pharmacy (16 hrs)- provides 1811kcal/day and 102g/day protein   Ensure Enlive po BID, each supplement provides 350 kcal and 20 grams of protein.  MVI, chromium and trace elements in TPN   Daily weights   Vitamin D2- 50,000 units po weekly   Zinc '2mg'$  daily added to TPN   Will recheck routine vitamin labs 2/8   NUTRITION DIAGNOSIS:   Increased nutrient needs related to wound healing, catabolic illness as evidenced by estimated needs.  GOAL:   Patient will meet greater than or equal to 90% of their needs -met with TPN   MONITOR:   PO intake, Supplement acceptance, Labs, Weight trends, Diet advancement, I & O's, TPN  ASSESSMENT:   57 y/o female with h/o hypothyroidism, COVID 19 (3/21), kidney stones and stage 3 colon cancer (s/p left hemicolectomy 5/21 and chemotherapy) who is admitted for new pelvic mass now s/p laparotomy 6/8 (with excision of pelvic mass from greater omentum, abdominal wall reconstruction with bilateral myocutaneous flaps and mesh, incisional hernia repair, appendectomy repair and VAC placement) complicated by bowel perforation s/p reopening of recent laparotomy 6/10 (with repair of small bowel perforation, excision of mesh, placement of two phasix mesh and VAC placement). Pathology returned as metastatic adenocarcinoma. Pt with L1 compression fracture.   Pt continues to tolerate TPN well at goal rate; TPN is currently being cycled for 16 hrs. Triglycerides wnl and are being checked monthly. Hyperglycemia improved with insulin in TPN. Pt up ~13lbs since admission. Urinary iodine elevated; suspect this is in r/t numerous CT scans. Will check routine vitamin labs every two months as pt on chronic TPN. Pt continues to have poor oral intake; pt eating <25% of meals. Pt on [place] diet and noted to have some blood clots from [name] pouch; pouch with 1143m output. No  discharge plan in place.   Medications reviewed and include: D2, insulin, synthroid, protonix, TPN  -Selenium 93(L), Chromium 2.3(H), copper 118 wnl, Manganese 18.6(H) - 12/7 -zinc 95 wnl, vitamin D 49.04 wnl, Iodine 450.1(H)- 1/9 -Iron 32.0 wnl, TIBC 330, ferritin 54 wnl, folate 14.4 wnl, B12 769- 12/7 -Vitamin B1- 91.2, vitamin A- 21.5, B6- 22.8 wnl, vitamin C- 0.5, vitamin E- 12.7 wnl, vitamin K 0.82- 12/7  -urinary iodine- 3257ug/L(H)  Labs reviewed: Na 137 wnl, K 4.1 wnl, P 2.9 wnl, Mg 1.9 wnl- 1/22 Triglycerides- 86-1/22 Cbgs- 110, 139 x 24 hrs  Diet Order:    Diet Order             Diet full liquid Fluid consistency: Thin  Diet effective now                  EDUCATION NEEDS:   Not appropriate for education at this time  Skin:  Skin Assessment: Reviewed RN Assessment ( Midline wound: 11cm x 12cm )  Last BM:  1/19- type 5  Height:   Ht Readings from Last 1 Encounters:  03/01/22 '4\' 11"'$  (1.499 m)    Weight:   Wt Readings from Last 1 Encounters:  09/30/22 95.6 kg    Ideal Body Weight:  44.3 kg  BMI:  Body mass index is 42.58 kg/m.  Estimated Nutritional Needs:   Kcal:  1800-2100kcal/day  Protein:  90-110g/d  Fluid:  1.4-1.6L/day  CKoleen DistanceMS, RD, LDN Please refer to [facility]for RD and/or RD on-call/weekend/after hours pager

## 2022-09-30 NOTE — Progress Notes (Signed)
[facility] SURGICAL PROGRESS NOTE (cpt [phone])  Hospital Day(s): 229.   Post op day(s): 227 Days Post-Op.   Interval History:  Patient seen and examined RN staff overnight worried about depressed affect  No acute issues; groin/low abdominal pain improving slightly  No nausea, emesis Still with clots from [place]  No new labs this morning  [name] output 1100 ccs; managing effluent independently Advanced to FLD; still poor appetite On TPN  Review of Systems:  Constitutional: denies fever, chills  HEENT: denies cough or congestion  Respiratory: denies any shortness of breath  Cardiovascular: denies chest pain or palpitations  Gastrointestinal: + Abdominal pain, denies N/V Genitourinary: denies burning with urination or urinary frequency Integumentary: + midline wound (stable)   Vital signs in last 24 hours: [min-max] current  Temp:  [97.9 F (36.6 C)-98.9 F (37.2 C)] 98.6 F (37 C) (01/23 0522) Pulse Rate:  [65-78] 78 (01/23 0522) Resp:  [18] 18 (01/23 0522) BP: (90-108)/(53-58) 93/57 (01/23 0522) SpO2:  [96 %-100 %] 98 % (01/23 0522)     Height: '4\' 11"'$  (149.9 cm) Weight: 96.1 kg BMI (Calculated): 43.04   Intake/Output last 2 shifts:  01/22 0701 - 01/23 0700 In: 240 [P.O.:240] Out: 1100 [Drains:1100]   Physical Exam:  Constitutional: alert, cooperative and no distress  Respiratory: breathing non-labored at rest  Cardiovascular: regular rate and sinus rhythm  Gastrointestinal: Soft, rather unchanged suprapubic tenderness (mild), non-distended, no rebound/guarding. Integumentary:  Port removal site in right chest is healing via secondary intention; no erythema. Midline wound open, peritoneum closed; granulating; there are three stomatized areas visible in the LUQ and LLQ portions of the wound, old clots in bag this morning. There is hypergranulation tissue to the inferior wound edge   Labs:     Latest Ref Rng & Units 09/29/2022    6:15 AM  09/25/2022    9:37 AM 09/13/2022    3:37 PM  CBC  WBC 4.0 - 10.5 K/uL 2.9  2.7  3.5   Hemoglobin 12.0 - 15.0 g/dL 8.9  5.9  7.3   Hematocrit 36.0 - 46.0 % 29.4  19.6  23.3   Platelets 150 - 400 K/uL 73  82  89       Latest Ref Rng & Units 09/29/2022    6:15 AM 09/25/2022    9:37 AM 09/22/2022   11:53 AM  CMP  Glucose 70 - 99 mg/dL 148  151  137   BUN 6 - 20 mg/dL '19  20  20   '$ Creatinine 0.44 - 1.00 mg/dL 0.48  0.41  0.36   Sodium 135 - 145 mmol/L 137  137  136   Potassium 3.5 - 5.1 mmol/L 4.1  4.0  4.4   Chloride 98 - 111 mmol/L 107  105  108   CO2 22 - 32 mmol/L '24  27  23   '$ Calcium 8.9 - 10.3 mg/dL 8.2  8.3  8.1   Total Protein 6.5 - 8.1 g/dL 6.4  6.2  6.3   Total Bilirubin 0.3 - 1.2 mg/dL 0.7  0.5  0.5   Alkaline Phos 38 - 126 U/L 131  125  127   AST 15 - 41 U/L '25  23  28   '$ ALT 0 - 44 U/L '19  17  21     '$ Imaging studies: No new pertinent imaging studies this AM   Assessment/Plan:  57 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   - New 01/23: Monitor H&H; responded to transfusions. Still with old clots in wound bed; no obvious source of bleeding. This has occurred intermittently throughout admission. Will follow closely; discussed with RN  - Wound Care ([name] Pouch); Appreciate WOC RN assistance. No longer on intermittent or continuous suction. Patient seems to like this better and has started to manage effluent independently. She will need intermittent suction with [place] to control effluent. Likely be beneficial to suction before ambulation.    - Continue daily dressing change of right chest port site; okay to cover with Band-Aid or gauze with tape  - Resumed TPN; nutritional labs pending this morning -  Appreciate dietary assistance with trace elements; iron supplementation            - Monitor abdominal examination; on-going bowel function            - Pain control prn; antemetic prn            - Progressed with therapies; no longer any recommendations  - Appreciate neurosurgery assistance with compression fracture; serial XRs   - Discharge Planning: Continue to anticipate lengthy admission +/- possible transfer; no new progress regarding disposition unfortunately   All of the above findings and recommendations were discussed with the patient, and the medical team, and all of patient's questions were answered to her expressed satisfaction.  -- [name], PA-C [facility] 09/30/2022, 7:12 AM M-F: 7am - 4pm

## 2022-09-30 NOTE — Progress Notes (Signed)
Patient Debra [name] with dark bloody output with large blood clots.

## 2022-09-30 NOTE — TOC Progression Note (Signed)
Transition of Care [facility]) - Progression Note    Patient Details  Name: Debra [name] MRN: [id number] Date of Birth: Feb 09, 1966  [facility]) CM/SW Contact  Beverly Sessions, RN Phone Number: 09/30/2022, 3:31 PM  Clinical Narrative:    Per PA, Monitor H&H .   No update per discharge planning    Expected Discharge Plan: [place] Barriers to Discharge: Continued Medical Work up  Expected Discharge Plan and Services                                               Social Determinants of Health (SDOH) Interventions SDOH Screenings   Food Insecurity: No Food Insecurity (05/20/2022)  Housing: Low Risk  (05/20/2022)  Transportation Needs: No Transportation Needs (05/20/2022)  Utilities: Not At Risk (05/20/2022)  Tobacco Use: Low Risk  (09/12/2022)    Readmission Risk Interventions     No data to display

## 2022-09-30 NOTE — Progress Notes (Signed)
PHARMACY - TOTAL PARENTERAL NUTRITION CONSULT NOTE   Indication: Prolonged ileus  Patient Measurements: Height: '4\' 11"'$  (149.9 cm) Weight: 96.1 kg (211 lb 14.4 oz) IBW/kg (Calculated) : 43.2 TPN AdjBW (KG): 55.2 Body mass index is 42.8 kg/m.  Assessment: Debra [name] is a 57 y.o. female s/p laparotomy, excision of greater omental mass, abdominal wall reconstruction with [name], appendectomy, and placement of Prevena vac.  Glucose / Insulin:  --No apparent history of diabetes --BG 79 - 139  --rSSI 4x/day  --SSI: 6u last 24h  17 units insulin in TPN Electrolytes: WNL Renal: SCr stable at approximate baseline  Hepatic: LFTs wnl --bilirubin wnl GI Imaging: 9/11 CTAP: no new acute issues GI Surgeries / Procedures: s/p laparotomy, excision of greater omental mass, abdominal wall reconstruction with [name], appendectomy, and placement of Prevena vac  Central access: 02/15/22 TPN start date: 02/15/22  Nutritional Goals: Goal cyclic TPN over 16 hrs: cyclic TPN over 16 hrs: (provides 102 g of protein and 1811 kcals per day) total volume over 16hrs for calculations=1400 ml (1500 ml total with overfill)  RD Assessment:  Estimated Needs Total Energy Estimated Needs: 1800-2100kcal/day Total Protein Estimated Needs: 90-110g/d Total Fluid Estimated Needs: 1.4-1.6L/day  Current Nutrition:  CLD + nutritional supplements, not meeting PO needs yet   Plan: Transitioned to *Cyclic* TPN on 9/75.   to run over 16 hours per MD request. [name] at 2000 per discussion with Dietician to allow patient time for walking in afternoon/evenings.  To run over 16 hours: -Start rate at 49 mL/hr for 1 hour. -Increase rate to 98 mL/hr for 13 hours.  -Decrease rate to 49 mL/hr for 1 hour. -Decrease rate to 25 mL/hr for 1 hour, then stop.  Plan:  See cyclic TPN rate above Continue Nutritional Components Amino acids (using 15% Clinisol): 101 g Dextrose 19% = 266  g Lipids (using 20% SMOFlipids): 50.4 g kCal: 1817/24h  Electrolytes in TPN: Na 18mq/L, K 463m/L, Ca 66m73mL, Mg 10 mEq/L, Phos 10 mmol/L.  Adjusted ratio 1:1 due to improving CO2 and Chloride Continue MVI, trace elements, chromium and zinc per dietary recs. Continue CBG/SSI for Cyclic TPN:  -CBG 2 hrs after cyclic TPN start -CBG during middle of cyclic TPN infusion -CBG 1 hr after cylic TPN stopped -CBG while off TPN Continue resistant SSI  and continue insulin in TPN to 17 units Check TPN labs on Mon/Thurs at minimum Monitor for further need of diuresis and adjust as needed for fluid status zinc 5 mg added to TPN 12/16 per dietician recommendation.  RodVallery SaharmD, BCPS Clinical Pharmacist  09/30/2022 7:01 AM

## 2022-10-01 LAB — GLUCOSE, CAPILLARY
Glucose-Capillary: 168 mg/dL — ABNORMAL HIGH (ref 70–99)
Glucose-Capillary: 111 mg/dL — ABNORMAL HIGH (ref 70–99)
Glucose-Capillary: 120 mg/dL — ABNORMAL HIGH (ref 70–99)
Glucose-Capillary: 84 mg/dL (ref 70–99)

## 2022-10-01 MED ORDER — ZINC CHLORIDE 1 MG/ML IV SOLN
INTRAVENOUS | Status: AC
  Filled 2022-10-01: qty 672

## 2022-10-01 NOTE — Progress Notes (Signed)
PHARMACY - TOTAL PARENTERAL NUTRITION CONSULT NOTE   Indication: Prolonged ileus  Patient Measurements: Height: '4\' 11"'$  (149.9 cm) Weight: 95.6 kg (210 lb 12.8 oz) IBW/kg (Calculated) : 43.2 TPN AdjBW (KG): 55.2 Body mass index is 42.58 kg/m.  Assessment: Debra [name] is a 57 y.o. female s/p laparotomy, excision of greater omental mass, abdominal wall reconstruction with [name], appendectomy, and placement of Prevena vac.  Glucose / Insulin:  --No apparent history of diabetes --BG 85 - 168  --rSSI 4x/day  --SSI: 4u last 24h  17 units insulin in TPN Electrolytes: WNL Renal: SCr stable at approximate baseline  Hepatic: LFTs wnl --bilirubin wnl GI Imaging: 9/11 CTAP: no new acute issues GI Surgeries / Procedures: s/p laparotomy, excision of greater omental mass, abdominal wall reconstruction with [name], appendectomy, and placement of Prevena vac  Central access: 02/15/22 TPN start date: 02/15/22  Nutritional Goals: Goal cyclic TPN over 16 hrs: cyclic TPN over 16 hrs: (provides 102 g of protein and 1811 kcals per day) total volume over 16hrs for calculations=1400 ml (1500 ml total with overfill)  RD Assessment:  Estimated Needs Total Energy Estimated Needs: 1800-2100kcal/day Total Protein Estimated Needs: 90-110g/d Total Fluid Estimated Needs: 1.4-1.6L/day  Current Nutrition:  CLD + nutritional supplements, not meeting PO needs yet   Plan: Transitioned to *Cyclic* TPN on 4/65.   to run over 16 hours per MD request. [name] at 2000 per discussion with Dietician to allow patient time for walking in afternoon/evenings.  To run over 16 hours: -Start rate at 49 mL/hr for 1 hour. -Increase rate to 98 mL/hr for 13 hours.  -Decrease rate to 49 mL/hr for 1 hour. -Decrease rate to 25 mL/hr for 1 hour, then stop.  Plan:  See cyclic TPN rate above Continue Nutritional Components Amino acids (using 15% Clinisol): 101 g Dextrose 19% = 266  g Lipids (using 20% SMOFlipids): 50.4 g kCal: 1817/24h  Electrolytes in TPN: Na 390mq/L, K 431m/L, Ca 90m79mL, Mg 10 mEq/L, Phos 10 mmol/L.  Adjusted ratio 1:1 due to improving CO2 and Chloride Continue MVI, trace elements, chromium and zinc per dietary recs. Continue CBG/SSI for Cyclic TPN:  -CBG 2 hrs after cyclic TPN start -CBG during middle of cyclic TPN infusion -CBG 1 hr after cylic TPN stopped -CBG while off TPN Continue resistant SSI  and continue insulin in TPN to 17 units Check TPN labs on Mon/Thurs at minimum Monitor for further need of diuresis and adjust as needed for fluid status zinc 5 mg added to TPN 12/16 per dietician recommendation.  RodVallery SaharmD, BCPS Clinical Pharmacist  10/01/2022 7:17 AM

## 2022-10-01 NOTE — Consult Note (Signed)
Green Nurse wound follow up   Wound type:midline fistula with large [name] pouch in place.   Was last changed 09/30/22 due to leaking. Pouch is signed and dated.  New epithelial tissue in center noted.  Stomatized fistula at distal end ( 5 o'clock) is most productive.  Stomatized fistula at 1 o'clock produces mucus.  Undermining at distal end.  Uses Yankeur suction intermittently  Wound bed: 15% epithelial tissue 2 stomatized fistulas Drainage (amount, consistency, odor)  Liquid brown effluent  feculent odor  RN noted large clots in recent days.  Bloody effluent in pouch today. No clotting present.  Periwound: New epithelial tissue growth noted, pouch intact (changed 1 day ago)  Dressing procedure/placement/frequency: Large [name] pouch with window and spout.  [name] strips (center of pouch that is cut away) line the periwound, stoma powder and skin prep.   [place] team will check every 7-10 days and PRN.    Estrellita Ludwig MSN, RN, FNP-BC CWON Wound, Ostomy, Continence Nurse [facility] [phone] Pager [phone]

## 2022-10-01 NOTE — Progress Notes (Signed)
[facility] SURGICAL PROGRESS NOTE (cpt [phone])  Hospital Day(s): 230.   Post op day(s): 228 Days Post-Op.   Interval History:  Patient seen and examined Still with bilateral groin pain; seems improving No fever, chills, nausea [name] output still blood tinged; no clots; no obvious bleeding identifiable  [name] output 700 ccs; managing effluent independently Advanced to FLD; still poor appetite On TPN  Review of Systems:  Constitutional: denies fever, chills  HEENT: denies cough or congestion  Respiratory: denies any shortness of breath  Cardiovascular: denies chest pain or palpitations  Gastrointestinal: + Abdominal pain, denies N/V Genitourinary: denies burning with urination or urinary frequency Integumentary: + midline wound (stable)   Vital signs in last 24 hours: [min-max] current  Temp:  [98.2 F (36.8 C)-98.6 F (37 C)] 98.2 F (36.8 C) (01/24 0452) Pulse Rate:  [71-76] 71 (01/24 0452) Resp:  [18] 18 (01/24 0452) BP: (108)/(60-63) 108/63 (01/24 0452) SpO2:  [97 %-100 %] 97 % (01/24 0452) Weight:  [95.6 kg] 95.6 kg (01/23 1000)     Height: '4\' 11"'$  (149.9 cm) Weight: 95.6 kg BMI (Calculated): 43.04   Intake/Output last 2 shifts:  01/23 0701 - 01/24 0700 In: 1896.4 [P.O.:490; I.V.:1406.4] Out: 700 [Drains:700]   Physical Exam:  Constitutional: alert, cooperative and no distress  Respiratory: breathing non-labored at rest  Cardiovascular: regular rate and sinus rhythm  Gastrointestinal: Soft, rather unchanged suprapubic tenderness (mild), non-distended, no rebound/guarding. Integumentary:  Port removal site in right chest is healing via secondary intention; no erythema. Midline wound open, peritoneum closed; granulating; there are three stomatized areas visible in the LUQ and LLQ portions of the wound, output serosanguinous mixed with stool. There is hypergranulation tissue to the inferior wound edge Musculoskeletal: Trace edema to lower extremities;  no calf tenderness nor erythema. No other edema present    Labs:     Latest Ref Rng & Units 09/29/2022    6:15 AM 09/25/2022    9:37 AM 09/13/2022    3:37 PM  CBC  WBC 4.0 - 10.5 K/uL 2.9  2.7  3.5   Hemoglobin 12.0 - 15.0 g/dL 8.9  5.9  7.3   Hematocrit 36.0 - 46.0 % 29.4  19.6  23.3   Platelets 150 - 400 K/uL 73  82  89       Latest Ref Rng & Units 09/29/2022    6:15 AM 09/25/2022    9:37 AM 09/22/2022   11:53 AM  CMP  Glucose 70 - 99 mg/dL 148  151  137   BUN 6 - 20 mg/dL '19  20  20   '$ Creatinine 0.44 - 1.00 mg/dL 0.48  0.41  0.36   Sodium 135 - 145 mmol/L 137  137  136   Potassium 3.5 - 5.1 mmol/L 4.1  4.0  4.4   Chloride 98 - 111 mmol/L 107  105  108   CO2 22 - 32 mmol/L '24  27  23   '$ Calcium 8.9 - 10.3 mg/dL 8.2  8.3  8.1   Total Protein 6.5 - 8.1 g/dL 6.4  6.2  6.3   Total Bilirubin 0.3 - 1.2 mg/dL 0.7  0.5  0.5   Alkaline Phos 38 - 126 U/L 131  125  127   AST 15 - 41 U/L '25  23  28   '$ ALT 0 - 44 U/L '19  17  21     '$ Imaging studies: No new pertinent imaging studies this AM   Assessment/Plan:  57 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   - New 01/24: No significant changes, groin pain improving. [name] output similar; CBC pending for AM.   - Wound Care ([name] Pouch); Appreciate WOC RN assistance. No longer on intermittent or continuous suction. Patient seems to like this better and has started to manage effluent independently. She will need intermittent suction with Yankauer to control effluent. Likely be beneficial to suction before ambulation.    - Continue daily dressing change of right chest port site; okay to cover with Band-Aid or gauze with tape  - Resumed TPN; nutritional labs pending this morning -  Appreciate dietary assistance with trace elements; iron supplementation            - Monitor abdominal examination; on-going bowel function            - Pain control prn; antemetic prn            - Progressed with therapies; no longer any recommendations  - Appreciate neurosurgery assistance with compression fracture; serial XRs   - Discharge Planning: Continue to anticipate lengthy admission +/- possible transfer; no new progress regarding disposition unfortunately   All of the above findings and recommendations were discussed with the patient, and the medical team, and all of patient's questions were answered to her expressed satisfaction.  -- [name], PA-C [facility] 10/01/2022, 7:21 AM M-F: 7am - 4pm

## 2022-10-02 LAB — CBC
HCT: 26 % — ABNORMAL LOW (ref 36.0–46.0)
Hemoglobin: 8 g/dL — ABNORMAL LOW (ref 12.0–15.0)
MCH: 28.3 pg (ref 26.0–34.0)
MCHC: 30.8 g/dL (ref 30.0–36.0)
MCV: 91.9 fL (ref 80.0–100.0)
Platelets: 82 10*3/uL — ABNORMAL LOW (ref 150–400)
RBC: 2.83 MIL/uL — ABNORMAL LOW (ref 3.87–5.11)
RDW: 17.5 % — ABNORMAL HIGH (ref 11.5–15.5)
WBC: 2.8 10*3/uL — ABNORMAL LOW (ref 4.0–10.5)
nRBC: 0 % (ref 0.0–0.2)

## 2022-10-02 LAB — COMPREHENSIVE METABOLIC PANEL
ALT: 19 U/L (ref 0–44)
AST: 26 U/L (ref 15–41)
Albumin: 2.4 g/dL — ABNORMAL LOW (ref 3.5–5.0)
Alkaline Phosphatase: 123 U/L (ref 38–126)
Anion gap: 4 — ABNORMAL LOW (ref 5–15)
BUN: 21 mg/dL — ABNORMAL HIGH (ref 6–20)
CO2: 24 mmol/L (ref 22–32)
Calcium: 7.9 mg/dL — ABNORMAL LOW (ref 8.9–10.3)
Chloride: 106 mmol/L (ref 98–111)
Creatinine, Ser: 0.44 mg/dL (ref 0.44–1.00)
GFR, Estimated: >60 mL/min (ref >60)
Glucose, Bld: 151 mg/dL — ABNORMAL HIGH (ref 70–99)
Potassium: 3.9 mmol/L (ref 3.5–5.1)
Sodium: 134 mmol/L — ABNORMAL LOW (ref 135–145)
Total Bilirubin: 0.6 mg/dL (ref 0.3–1.2)
Total Protein: 6.3 g/dL — ABNORMAL LOW (ref 6.5–8.1)

## 2022-10-02 LAB — GLUCOSE, CAPILLARY
Glucose-Capillary: 174 mg/dL — ABNORMAL HIGH (ref 70–99)
Glucose-Capillary: 176 mg/dL — ABNORMAL HIGH (ref 70–99)
Glucose-Capillary: 109 mg/dL — ABNORMAL HIGH (ref 70–99)
Glucose-Capillary: 85 mg/dL (ref 70–99)

## 2022-10-02 LAB — TRIGLYCERIDES: Triglycerides: 79 mg/dL (ref <150)

## 2022-10-02 LAB — PHOSPHORUS: Phosphorus: 3.2 mg/dL (ref 2.5–4.6)

## 2022-10-02 LAB — MAGNESIUM: Magnesium: 2 mg/dL (ref 1.7–2.4)

## 2022-10-02 MED ORDER — ZINC CHLORIDE 1 MG/ML IV SOLN
INTRAVENOUS | Status: AC
  Filled 2022-10-02: qty 672

## 2022-10-02 NOTE — TOC Progression Note (Signed)
Transition of Care [facility]) - Progression Note    Patient Details  Name: Debra [name] MRN: [id number] Date of Birth: 25-Jun-1966  [facility] CM/SW Contact  Beverly Sessions, RN Phone Number: 10/02/2022, 1:32 PM  Clinical Narrative:     Per surgery no new progress regarding disposition   Expected Discharge Plan: [place] Barriers to Discharge: Continued Medical Work up  Expected Discharge Plan and Services                                               Social Determinants of Health (SDOH) Interventions SDOH Screenings   Food Insecurity: No Food Insecurity (05/20/2022)  Housing: Low Risk  (05/20/2022)  Transportation Needs: No Transportation Needs (05/20/2022)  Utilities: Not At Risk (05/20/2022)  Tobacco Use: Low Risk  (09/12/2022)    Readmission Risk Interventions     No data to display

## 2022-10-02 NOTE — Progress Notes (Signed)
PHARMACY - TOTAL PARENTERAL NUTRITION CONSULT NOTE   Indication: Prolonged ileus  Patient Measurements: Height: '4\' 11"'$  (149.9 cm) Weight: 95 kg (209 lb 7 oz) IBW/kg (Calculated) : 43.2 TPN AdjBW (KG): 55.2 Body mass index is 42.3 kg/m.  Assessment: Debra [name] is a 57 y.o. female s/p laparotomy, excision of greater omental mass, abdominal wall reconstruction with [name], appendectomy, and placement of Prevena vac.  Glucose / Insulin:  --No apparent history of diabetes --BG 84 - 174 --rSSI 4x/day  --SSI: 4u last 24h  17 units insulin in TPN Electrolytes: WNL Renal: SCr stable at approximate baseline  Hepatic: LFTs wnl --bilirubin wnl GI Imaging: 9/11 CTAP: no new acute issues GI Surgeries / Procedures: s/p laparotomy, excision of greater omental mass, abdominal wall reconstruction with [name], appendectomy, and placement of Prevena vac  Central access: 02/15/22 TPN start date: 02/15/22  Nutritional Goals: Goal cyclic TPN over 16 hrs: cyclic TPN over 16 hrs: (provides 102 g of protein and 1811 kcals per day) total volume over 16hrs for calculations=1400 ml (1500 ml total with overfill)  RD Assessment:  Estimated Needs Total Energy Estimated Needs: 1800-2100kcal/day Total Protein Estimated Needs: 90-110g/d Total Fluid Estimated Needs: 1.4-1.6L/day  Current Nutrition:  CLD + nutritional supplements, not meeting PO needs yet   Plan: Transitioned to *Cyclic* TPN on 3/64.   to run over 16 hours per MD request. [name] at 2000 per discussion with Dietician to allow patient time for walking in afternoon/evenings.  To run over 16 hours: -Start rate at 49 mL/hr for 1 hour. -Increase rate to 98 mL/hr for 13 hours.  -Decrease rate to 49 mL/hr for 1 hour. -Decrease rate to 25 mL/hr for 1 hour, then stop.  Plan:  See cyclic TPN rate above Continue Nutritional Components Amino acids (using 15% Clinisol): 101 g Dextrose 19% = 266 g Lipids  (using 20% SMOFlipids): 50.4 g kCal: 1817/24h  Electrolytes in TPN: Na 93mq/L, K 424m/L, Ca 53m60mL, Mg 10 mEq/L, Phos 10 mmol/L.  Adjusted ratio 1:1 due to improving CO2 and Chloride Continue MVI, trace elements, chromium and zinc per dietary recs. Continue CBG/SSI for Cyclic TPN:  -CBG 2 hrs after cyclic TPN start -CBG during middle of cyclic TPN infusion -CBG 1 hr after cylic TPN stopped -CBG while off TPN Continue resistant SSI  and continue insulin in TPN to 17 units Check TPN labs on Mon/Thurs at minimum Monitor for further need of diuresis and adjust as needed for fluid status zinc 5 mg added to TPN 12/16 per dietician recommendation.  RodVallery SaharmD, BCPS Clinical Pharmacist  10/02/2022 7:03 AM

## 2022-10-02 NOTE — Progress Notes (Signed)
[facility] SURGICAL PROGRESS NOTE (cpt [phone])  Hospital Day(s): 231.   Post op day(s): 229 Days Post-Op.   Interval History:  Patient seen and examined Oozing again overnight, suspect from hypergranulation tissue inferiorly. Suctioned this morning with no active bleeding Reports pain is improving Labs are pending [name] output 350 ccs + unmeasured; managing effluent independently Advanced to FLD; still poor appetite On TPN  Review of Systems:  Constitutional: denies fever, chills  HEENT: denies cough or congestion  Respiratory: denies any shortness of breath  Cardiovascular: denies chest pain or palpitations  Gastrointestinal: + Abdominal pain, denies N/V Genitourinary: denies burning with urination or urinary frequency Integumentary: + midline wound (stable)   Vital signs in last 24 hours: [min-max] current  Temp:  [98 F (36.7 C)-99.1 F (37.3 C)] 99.1 F (37.3 C) (01/25 0311) Pulse Rate:  [79-94] 94 (01/25 0311) Resp:  [18-20] 20 (01/25 0311) BP: (97-127)/(57-67) 127/62 (01/25 0311) SpO2:  [97 %-99 %] 98 % (01/25 0311) Weight:  [95 kg] 95 kg (01/25 0311)     Height: '4\' 11"'$  (149.9 cm) Weight: 95 kg BMI (Calculated): 43.04   Intake/Output last 2 shifts:  01/24 0701 - 01/25 0700 In: 905.6 [I.V.:905.6] Out: 450 [Drains:100]   Physical Exam:  Constitutional: alert, cooperative and no distress  Respiratory: breathing non-labored at rest  Cardiovascular: regular rate and sinus rhythm  Gastrointestinal: Soft, rather unchanged suprapubic tenderness (mild), non-distended, no rebound/guarding. Integumentary:  Port removal site in right chest is healing via secondary intention; no erythema. Midline wound open, peritoneum closed; granulating; there are three stomatized areas visible in the LUQ and LLQ portions of the wound, large clot in wound bed evacuated with suction, no signs of active bleeding at the moment of my examination; suspect this is coming from raw  hypergranulation tissue to the inferior wound edge Musculoskeletal: Trace edema to lower extremities; no calf tenderness nor erythema. No other edema present    Labs:     Latest Ref Rng & Units 09/29/2022    6:15 AM 09/25/2022    9:37 AM 09/13/2022    3:37 PM  CBC  WBC 4.0 - 10.5 K/uL 2.9  2.7  3.5   Hemoglobin 12.0 - 15.0 g/dL 8.9  5.9  7.3   Hematocrit 36.0 - 46.0 % 29.4  19.6  23.3   Platelets 150 - 400 K/uL 73  82  89       Latest Ref Rng & Units 09/29/2022    6:15 AM 09/25/2022    9:37 AM 09/22/2022   11:53 AM  CMP  Glucose 70 - 99 mg/dL 148  151  137   BUN 6 - 20 mg/dL '19  20  20   '$ Creatinine 0.44 - 1.00 mg/dL 0.48  0.41  0.36   Sodium 135 - 145 mmol/L 137  137  136   Potassium 3.5 - 5.1 mmol/L 4.1  4.0  4.4   Chloride 98 - 111 mmol/L 107  105  108   CO2 22 - 32 mmol/L '24  27  23   '$ Calcium 8.9 - 10.3 mg/dL 8.2  8.3  8.1   Total Protein 6.5 - 8.1 g/dL 6.4  6.2  6.3   Total Bilirubin 0.3 - 1.2 mg/dL 0.7  0.5  0.5   Alkaline Phos 38 - 126 U/L 131  125  127   AST 15 - 41 U/L '25  23  28   '$ ALT 0 - 44 U/L 19  17  21  Imaging studies: No new pertinent imaging studies this AM   Assessment/Plan:  57 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   - New 01/25: Wound evaluated at bedside this AM; suctioned with Yankauer, large clot removed. No active bleeding. Suspect she continues to have intermittent oozing from inferior hypergranulation tissue. Labs pending this AM. Follow up H&H  - Wound Care ([name] Pouch); Appreciate WOC RN assistance. No longer on intermittent or continuous suction. Patient seems to like this better and has started to manage effluent independently. She will need intermittent suction with Yankauer  to control effluent. Likely be beneficial to suction before ambulation.    - Continue daily dressing change of right chest port site; okay to cover with Band-Aid or gauze with tape  - Resumed TPN; nutritional labs pending this morning - Appreciate dietary assistance with trace elements; iron supplementation            - Monitor abdominal examination; on-going bowel function            - Pain control prn; antemetic prn            - Progressed with therapies; no longer any recommendations  - Appreciate neurosurgery assistance with compression fracture; serial XRs   - Discharge Planning: Continue to anticipate lengthy admission +/- possible transfer; no new progress regarding disposition unfortunately   All of the above findings and recommendations were discussed with the patient, and the medical team, and all of patient's questions were answered to her expressed satisfaction.  -- [name], PA-C [facility] 10/02/2022, 7:10 AM M-F: 7am - 4pm

## 2022-10-03 LAB — GLUCOSE, CAPILLARY
Glucose-Capillary: 178 mg/dL — ABNORMAL HIGH (ref 70–99)
Glucose-Capillary: 182 mg/dL — ABNORMAL HIGH (ref 70–99)
Glucose-Capillary: 126 mg/dL — ABNORMAL HIGH (ref 70–99)
Glucose-Capillary: 91 mg/dL (ref 70–99)
Glucose-Capillary: 116 mg/dL — ABNORMAL HIGH (ref 70–99)

## 2022-10-03 MED ORDER — SODIUM CHLORIDE 0.9 % IV BOLUS
500.0000 mL | Freq: Once | INTRAVENOUS | Status: AC
  Administered 2022-10-03: 500 mL via INTRAVENOUS

## 2022-10-03 MED ORDER — ZINC CHLORIDE 1 MG/ML IV SOLN
INTRAVENOUS | Status: AC
  Filled 2022-10-03: qty 672

## 2022-10-03 NOTE — Progress Notes (Signed)
   10/03/22 1856  Provider Notification  Provider Name/Title Dr. Lysle Pearl  Date Provider Notified 10/03/22  Time Provider Notified 1856  Method of Notification Call  Notification Reason Change in status (Bleeding noted from [facility] pouch)  Provider response At bedside  Date of Provider Response 10/03/22  Time of Provider Response 1904   Dr. Lysle Pearl notified of frank bleeding/ clots noted in Eakins pouch. Dr. Lysle Pearl at bedside to assess.

## 2022-10-03 NOTE — Progress Notes (Signed)
PHARMACY - TOTAL PARENTERAL NUTRITION CONSULT NOTE   Indication: Prolonged ileus  Patient Measurements: Height: '4\' 11"'$  (149.9 cm) Weight: 95 kg (209 lb 7 oz) IBW/kg (Calculated) : 43.2 TPN AdjBW (KG): 55.2 Body mass index is 42.3 kg/m.  Assessment: Debra [name] is a 57 y.o. female s/p laparotomy, excision of greater omental mass, abdominal wall reconstruction with [name], appendectomy, and placement of Prevena vac.  Glucose / Insulin:  --No apparent history of diabetes --BG 85 - 178 --rSSI 4x/day  --SSI: 8u last 24h  17 units insulin in TPN (decreased from 20 units on 1/21) Electrolytes: WNL Renal: SCr stable at approximate baseline  Hepatic: LFTs wnl --bilirubin wnl GI Imaging: 9/11 CTAP: no new acute issues GI Surgeries / Procedures: s/p laparotomy, excision of greater omental mass, abdominal wall reconstruction with [name], appendectomy, and placement of Prevena vac  Central access: 02/15/22 TPN start date: 02/15/22  Nutritional Goals: Goal cyclic TPN over 16 hrs: cyclic TPN over 16 hrs: (provides 102 g of protein and 1811 kcals per day) total volume over 16hrs for calculations=1400 ml (1500 ml total with overfill)  RD Assessment:  Estimated Needs Total Energy Estimated Needs: 1800-2100kcal/day Total Protein Estimated Needs: 90-110g/d Total Fluid Estimated Needs: 1.4-1.6L/day  Current Nutrition:  CLD + nutritional supplements, not meeting PO needs yet   Plan: Transitioned to *Cyclic* TPN on 8/50.   to run over 16 hours per MD request. [name] at 2000 per discussion with Dietician to allow patient time for walking in afternoon/evenings.  To run over 16 hours: -Start rate at 49 mL/hr for 1 hour. -Increase rate to 98 mL/hr for 13 hours.  -Decrease rate to 49 mL/hr for 1 hour. -Decrease rate to 25 mL/hr for 1 hour, then stop.  Plan:  See cyclic TPN rate above Continue Nutritional Components Amino acids (using 15% Clinisol): 101  g Dextrose 19% = 266 g Lipids (using 20% SMOFlipids): 50.4 g kCal: 1817/24h  Electrolytes in TPN: Na 77mq/L, K 471m/L, Ca 53m69mL, Mg 10 mEq/L, Phos 10 mmol/L.  Adjusted ratio 1:1 due to improving CO2 and Chloride Continue MVI, trace elements, chromium and zinc per dietary recs. Continue CBG/SSI for Cyclic TPN:  -CBG 2 hrs after cyclic TPN start -CBG during middle of cyclic TPN infusion -CBG 1 hr after cylic TPN stopped -CBG while off TPN Continue resistant SSI  and continue insulin in TPN to 17 units Check TPN labs on Mon/Thurs at minimum Monitor for further need of diuresis and adjust as needed for fluid status zinc 5 mg added to TPN 12/16 per dietician recommendation.  [name] Clinical Pharmacist 10/03/2022

## 2022-10-03 NOTE — Progress Notes (Signed)
[facility] SURGICAL PROGRESS NOTE (cpt [phone])  Hospital Day(s): 232.   Post op day(s): 230 Days Post-Op.   Interval History:  Patient seen and examined Pain improving No more oozing; Hgb stable [name] output 750 ccs + unmeasured; managing effluent independently Advanced to FLD; still poor appetite On TPN  Review of Systems:  Constitutional: denies fever, chills  HEENT: denies cough or congestion  Respiratory: denies any shortness of breath  Cardiovascular: denies chest pain or palpitations  Gastrointestinal: + Abdominal pain, denies N/V Genitourinary: denies burning with urination or urinary frequency Integumentary: + midline wound (stable)   Vital signs in last 24 hours: [min-max] current  Temp:  [98.1 F (36.7 C)-98.9 F (37.2 C)] 98.9 F (37.2 C) (01/26 0513) Pulse Rate:  [75-89] 89 (01/26 0513) Resp:  [16-18] 17 (01/26 0513) BP: (94-121)/(54-61) 105/54 (01/26 0524) SpO2:  [94 %-100 %] 94 % (01/26 0513)     Height: '4\' 11"'$  (149.9 cm) Weight: 95 kg BMI (Calculated): 43.04   Intake/Output last 2 shifts:  01/25 0701 - 01/26 0700 In: 240 [P.O.:240] Out: 750 [Drains:750]   Physical Exam:  Constitutional: alert, cooperative and no distress  Respiratory: breathing non-labored at rest  Cardiovascular: regular rate and sinus rhythm  Gastrointestinal: Soft, rather unchanged suprapubic tenderness (mild), non-distended, no rebound/guarding. Integumentary:  Port removal site in right chest is healing via secondary intention; no erythema. Midline wound open, peritoneum closed; granulating; there are three stomatized areas visible in the LUQ and LLQ portions of the wound, no current oozing; suspect this is coming from raw hypergranulation tissue to the inferior wound edge Musculoskeletal: Trace edema to lower extremities; no calf tenderness nor erythema. No other edema present    Labs:     Latest Ref Rng & Units 10/02/2022    8:36 AM 09/29/2022    6:15 AM  09/25/2022    9:37 AM  CBC  WBC 4.0 - 10.5 K/uL 2.8  2.9  2.7   Hemoglobin 12.0 - 15.0 g/dL 8.0  8.9  5.9   Hematocrit 36.0 - 46.0 % 26.0  29.4  19.6   Platelets 150 - 400 K/uL 82  73  82       Latest Ref Rng & Units 10/02/2022    8:36 AM 09/29/2022    6:15 AM 09/25/2022    9:37 AM  CMP  Glucose 70 - 99 mg/dL 151  148  151   BUN 6 - 20 mg/dL '21  19  20   '$ Creatinine 0.44 - 1.00 mg/dL 0.44  0.48  0.41   Sodium 135 - 145 mmol/L 134  137  137   Potassium 3.5 - 5.1 mmol/L 3.9  4.1  4.0   Chloride 98 - 111 mmol/L 106  107  105   CO2 22 - 32 mmol/L '24  24  27   '$ Calcium 8.9 - 10.3 mg/dL 7.9  8.2  8.3   Total Protein 6.5 - 8.1 g/dL 6.3  6.4  6.2   Total Bilirubin 0.3 - 1.2 mg/dL 0.6  0.7  0.5   Alkaline Phos 38 - 126 U/L 123  131  125   AST 15 - 41 U/L '26  25  23   '$ ALT 0 - 44 U/L '19  19  17     '$ Imaging studies: No new pertinent imaging studies this AM   Assessment/Plan:  57 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   - New 01/26: No new issues  - Wound Care ([name] Pouch); Appreciate WOC RN assistance. No longer on intermittent or continuous suction. Patient seems to like this better and has started to manage effluent independently. She will need intermittent suction with Yankauer to control effluent. Likely be beneficial to suction before ambulation.    - Continue daily dressing change of right chest port site; okay to cover with Band-Aid or gauze with tape  - Resumed TPN; nutritional labs pending this morning - Appreciate dietary assistance with trace elements; iron supplementation            - Monitor abdominal examination; on-going bowel function            - Pain control prn; antemetic prn            - Progressed  with therapies; no longer any recommendations  - Appreciate neurosurgery assistance with compression fracture; serial XRs   - Discharge Planning: Continue to anticipate lengthy admission +/- possible transfer; no new progress regarding disposition unfortunately   All of the above findings and recommendations were discussed with the patient, and the medical team, and all of patient's questions were answered to her expressed satisfaction.  -- [name], PA-C [facility] 10/03/2022, 7:10 AM M-F: 7am - 4pm

## 2022-10-04 LAB — PREPARE RBC (CROSSMATCH)

## 2022-10-04 LAB — GLUCOSE, CAPILLARY
Glucose-Capillary: 172 mg/dL — ABNORMAL HIGH (ref 70–99)
Glucose-Capillary: 60 mg/dL — ABNORMAL LOW (ref 70–99)
Glucose-Capillary: 77 mg/dL (ref 70–99)
Glucose-Capillary: 131 mg/dL — ABNORMAL HIGH (ref 70–99)

## 2022-10-04 LAB — HEMOGLOBIN AND HEMATOCRIT, BLOOD
HCT: 22.3 % — ABNORMAL LOW (ref 36.0–46.0)
Hemoglobin: 6.8 g/dL — ABNORMAL LOW (ref 12.0–15.0)

## 2022-10-04 MED ORDER — SODIUM CHLORIDE 0.9% IV SOLUTION: Freq: Once | INTRAVENOUS | Status: AC

## 2022-10-04 MED ORDER — ZINC CHLORIDE 1 MG/ML IV SOLN
INTRAVENOUS | Status: AC
  Filled 2022-10-04: qty 672

## 2022-10-04 NOTE — Progress Notes (Signed)
Called to bedside nursing for concerns of clot within the [name]'s pouch.  Examination noted small clots and no signs of active bleeding.  Recommended continue to monitor for now.

## 2022-10-04 NOTE — Progress Notes (Signed)
PHARMACY - TOTAL PARENTERAL NUTRITION CONSULT NOTE   Indication: Prolonged ileus  Patient Measurements: Height: '4\' 11"'$  (149.9 cm) Weight: 97 kg (213 lb 13.5 oz) IBW/kg (Calculated) : 43.2 TPN AdjBW (KG): 55.2 Body mass index is 43.19 kg/m.  Assessment: Debra [name] is a 57 y.o. female s/p laparotomy, excision of greater omental mass, abdominal wall reconstruction with [name], appendectomy, and placement of Prevena vac.  Glucose / Insulin:  --No apparent history of diabetes --BG 91-182 --rSSI 4x/day  --SSI: 11u last 24h  17 units insulin in TPN (decreased from 20 units on 1/21) Electrolytes: WNL Renal: SCr stable at approximate baseline  Hepatic: LFTs wnl --bilirubin wnl GI Imaging: 9/11 CTAP: no new acute issues GI Surgeries / Procedures: s/p laparotomy, excision of greater omental mass, abdominal wall reconstruction with [name], appendectomy, and placement of Prevena vac  Central access: 02/15/22 TPN start date: 02/15/22  Nutritional Goals: Goal cyclic TPN over 16 hrs: cyclic TPN over 16 hrs: (provides 102 g of protein and 1811 kcals per day) total volume over 16hrs for calculations=1400 ml (1500 ml total with overfill)  RD Assessment:  Estimated Needs Total Energy Estimated Needs: 1800-2100kcal/day Total Protein Estimated Needs: 90-110g/d Total Fluid Estimated Needs: 1.4-1.6L/day  Current Nutrition:  CLD + nutritional supplements, not meeting PO needs yet   Plan: Transitioned to *Cyclic* TPN on 0/07.   to run over 16 hours per MD request. [name] at 2000 per discussion with Dietician to allow patient time for walking in afternoon/evenings.  To run over 16 hours: -Start rate at 49 mL/hr for 1 hour. -Increase rate to 98 mL/hr for 13 hours.  -Decrease rate to 49 mL/hr for 1 hour. -Decrease rate to 25 mL/hr for 1 hour, then stop.  Plan:  See cyclic TPN rate above Continue Nutritional Components Amino acids (using 15% Clinisol): 101  g Dextrose 19% = 266 g Lipids (using 20% SMOFlipids): 50.4 g kCal: 1817/24h  Electrolytes in TPN: Na 56mq/L, K 433m/L, Ca 71m771mL, Mg 10 mEq/L, Phos 10 mmol/L.  Adjusted ratio 1:1 due to improving CO2 and Chloride Continue MVI, trace elements, chromium and zinc per dietary recs. Continue CBG/SSI for Cyclic TPN:  -CBG 2 hrs after cyclic TPN start -CBG during middle of cyclic TPN infusion -CBG 1 hr after cylic TPN stopped -CBG while off TPN Continue resistant SSI  and continue insulin in TPN at 17 units (decreased from 20 units on 1/21) Check TPN labs on Mon/Thurs at minimum Monitor for further need of diuresis and adjust as needed for fluid status zinc 5 mg added to TPN 12/16 per dietician recommendation.  [name] Clinical Pharmacist 10/04/2022

## 2022-10-04 NOTE — Progress Notes (Signed)
Patient is alert and oriented x4. Very weak. Called MD Lysle Pearl upon shift start about low bp. Received an order for a fluid bolus x2 was given and bp improved-reported improvement to MD. Previous nurse reported bleeding from the pouch with clots to MD-came to see-no new interventions were done. Patient does not have labs or hospitalist on case. Transferred from bed to [facility] for urination. [name]'s pouch put out 400 mls of foul smelling dark blood with clots. Patient requires x2 assistance when getting up-weaker in mobility. Contacted MD Lysle Pearl via secure chat to see if we could order an h&h.

## 2022-10-04 NOTE — Progress Notes (Signed)
Hypoglycemic Event  CBG: 60  Treatment: 8 oz juice/soda  Symptoms: None  Follow-up CBG: [id number] CBG Result:77  Possible Reasons for Event: Inadequate meal intake Stated afterwards that she did not like lunch so she did not eat a lot.   Comments/MD notified:

## 2022-10-04 NOTE — Progress Notes (Signed)
Subjective:  CC: Debra [name] is a 57 y.o. female  Hospital stay day 233, 231 Days Post-Op s/p re-opening of laparotomy for repair of small bowel perforation following initial laparotomy, excision of greater omental mass, abdominal wall reconstruction with [name], appendectomy, and placement of Prevena vac on 06/08.   HPI: No further issues reported overnight.  Patient feeling well.  ROS:  General: Denies weight loss, weight gain, fatigue, fevers, chills, and night sweats. Heart: Denies chest pain, palpitations, racing heart, irregular heartbeat, leg pain or swelling, and decreased activity tolerance. Respiratory: Denies breathing difficulty, shortness of breath, wheezing, cough, and sputum. GI: Denies change in appetite, heartburn, nausea, vomiting, constipation, diarrhea, and blood in stool. GU: Denies difficulty urinating, pain with urinating, urgency, frequency, blood in urine.   Objective:   Temp:  [98 F (36.7 C)-98.6 F (37 C)] 98.6 F (37 C) (01/27 0832) Pulse Rate:  [61-82] 76 (01/27 0832) Resp:  [18-20] 18 (01/27 0832) BP: (84-104)/(45-59) 101/56 (01/27 0832) SpO2:  [98 %-100 %] 99 % (01/27 0832) Weight:  [97 kg] 97 kg (01/27 0436)     Height: '4\' 11"'$  (149.9 cm) Weight: 97 kg BMI (Calculated): 43.04   Intake/Output this shift:   Intake/Output Summary (Last 24 hours) at 10/04/2022 1307 Last data filed at 10/04/2022 1112 Gross per 24 hour  Intake 1633.28 ml  Output 1025 ml  Net 608.28 ml    Constitutional :  alert, cooperative, appears stated age, and no distress  Respiratory:  clear to auscultation bilaterally  Cardiovascular:  regular rate and rhythm  Gastrointestinal: soft, non-tender; bowel sounds normal; no masses,  no organomegaly.  [name]'s pouch clean with no signs of additional bleeding.  Skin: Cool and moist.   Psychiatric: Normal affect, non-agitated, not confused       LABS:     Latest Ref Rng & Units 10/02/2022    8:36 AM  09/29/2022    6:15 AM 09/25/2022    9:37 AM  CMP  Glucose 70 - 99 mg/dL 151  148  151   BUN 6 - 20 mg/dL '21  19  20   '$ Creatinine 0.44 - 1.00 mg/dL 0.44  0.48  0.41   Sodium 135 - 145 mmol/L 134  137  137   Potassium 3.5 - 5.1 mmol/L 3.9  4.1  4.0   Chloride 98 - 111 mmol/L 106  107  105   CO2 22 - 32 mmol/L '24  24  27   '$ Calcium 8.9 - 10.3 mg/dL 7.9  8.2  8.3   Total Protein 6.5 - 8.1 g/dL 6.3  6.4  6.2   Total Bilirubin 0.3 - 1.2 mg/dL 0.6  0.7  0.5   Alkaline Phos 38 - 126 U/L 123  131  125   AST 15 - 41 U/L '26  25  23   '$ ALT 0 - 44 U/L '19  19  17       '$ Latest Ref Rng & Units 10/04/2022    7:30 AM 10/02/2022    8:36 AM 09/29/2022    6:15 AM  CBC  WBC 4.0 - 10.5 K/uL  2.8  2.9   Hemoglobin 12.0 - 15.0 g/dL 6.8  8.0  8.9   Hematocrit 36.0 - 46.0 % 22.3  26.0  29.4   Platelets 150 - 400 K/uL  82  73     RADS: N/a Assessment:   s/p re-opening of laparotomy for repair of small bowel perforation following initial laparotomy, excision of greater omental  mass, abdominal wall reconstruction with [name], appendectomy, and placement of Prevena vac on 06/08.   Intermittent bleeding episodes reported but no signs of active bleeding this a.m.  However, repeat blood work showed hemoglobin dropped to 6.8.  Therefore we will give 1 unit PRBC.  Patient asymptomatic at this time.  Continue to monitor.  labs/images/medications/previous chart entries reviewed personally and relevant changes/updates noted above.

## 2022-10-05 LAB — BPAM RBC
Blood Product Expiration Date: 202403052359
ISSUE DATE / TIME: 202401271535
Unit Type and Rh: 5100

## 2022-10-05 LAB — TYPE AND SCREEN
ABO/RH(D): O POS
Antibody Screen: NEGATIVE
Unit division: 0

## 2022-10-05 LAB — CBC
HCT: 24 % — ABNORMAL LOW (ref 36.0–46.0)
Hemoglobin: 7.5 g/dL — ABNORMAL LOW (ref 12.0–15.0)
MCH: 28.2 pg (ref 26.0–34.0)
MCHC: 31.3 g/dL (ref 30.0–36.0)
MCV: 90.2 fL (ref 80.0–100.0)
Platelets: 100 10*3/uL — ABNORMAL LOW (ref 150–400)
RBC: 2.66 MIL/uL — ABNORMAL LOW (ref 3.87–5.11)
RDW: 16.7 % — ABNORMAL HIGH (ref 11.5–15.5)
WBC: 2.6 10*3/uL — ABNORMAL LOW (ref 4.0–10.5)
nRBC: 0 % (ref 0.0–0.2)

## 2022-10-05 LAB — GLUCOSE, CAPILLARY
Glucose-Capillary: 153 mg/dL — ABNORMAL HIGH (ref 70–99)
Glucose-Capillary: 98 mg/dL (ref 70–99)
Glucose-Capillary: 150 mg/dL — ABNORMAL HIGH (ref 70–99)

## 2022-10-05 MED ORDER — ZINC CHLORIDE 1 MG/ML IV SOLN
INTRAVENOUS | Status: AC
  Filled 2022-10-05: qty 672

## 2022-10-05 NOTE — Progress Notes (Signed)
PHARMACY - TOTAL PARENTERAL NUTRITION CONSULT NOTE   Indication: Prolonged ileus  Patient Measurements: Height: '4\' 11"'$  (149.9 cm) Weight: 99 kg (218 lb 4.1 oz) IBW/kg (Calculated) : 43.2 TPN AdjBW (KG): 55.2 Body mass index is 44.08 kg/m.  Assessment: Debra [name] is a 57 y.o. female s/p laparotomy, excision of greater omental mass, abdominal wall reconstruction with [name], appendectomy, and placement of Prevena vac.  Glucose / Insulin:  --No apparent history of diabetes --BG 60-172 --rSSI 4x/day  --SSI: 8u last 24h  17 units insulin in TPN (decreased from 20 units on 1/21) (-BG 60 on 1/27: RN note:pt did not like lunch and did not eat much)  Electrolytes: WNL Renal: SCr stable at approximate baseline  Hepatic: LFTs wnl --bilirubin wnl GI Imaging: 9/11 CTAP: no new acute issues GI Surgeries / Procedures: s/p laparotomy, excision of greater omental mass, abdominal wall reconstruction with [name], appendectomy, and placement of Prevena vac  Central access: 02/15/22 TPN start date: 02/15/22  Nutritional Goals: Goal cyclic TPN over 16 hrs: cyclic TPN over 16 hrs: (provides 102 g of protein and 1811 kcals per day) total volume over 16hrs for calculations=1400 ml (1500 ml total with overfill)  RD Assessment:  Estimated Needs Total Energy Estimated Needs: 1800-2100kcal/day Total Protein Estimated Needs: 90-110g/d Total Fluid Estimated Needs: 1.4-1.6L/day  Current Nutrition:  CLD + nutritional supplements, not meeting PO needs yet   Plan: Transitioned to *Cyclic* TPN on 1/02.   to run over 16 hours per MD request. [name] at 2000 per discussion with Dietician to allow patient time for walking in afternoon/evenings.  To run over 16 hours: -Start rate at 49 mL/hr for 1 hour. -Increase rate to 98 mL/hr for 13 hours.  -Decrease rate to 49 mL/hr for 1 hour. -Decrease rate to 25 mL/hr for 1 hour, then stop.  Plan:  See cyclic TPN rate  above Continue Nutritional Components Amino acids (using 15% Clinisol): 101 g Dextrose 19% = 266 g Lipids (using 20% SMOFlipids): 50.4 g kCal: 1817/24h  Electrolytes in TPN: Na 49mq/L, K 451m/L, Ca 78m12mL, Mg 10 mEq/L, Phos 10 mmol/L.  Adjusted ratio 1:1 due to improving CO2 and Chloride Continue MVI, trace elements, chromium and zinc per dietary recs. Continue CBG/SSI for Cyclic TPN:  -CBG 2 hrs after cyclic TPN start -CBG during middle of cyclic TPN infusion -CBG 1 hr after cylic TPN stopped -CBG while off TPN Continue resistant SSI  and continue insulin in TPN at 17 units (decreased from 20 units on 1/21) Check TPN labs on Mon/Thurs at minimum zinc 5 mg added to TPN 12/16 per dietician recommendation.  [name] Clinical Pharmacist 10/05/2022

## 2022-10-05 NOTE — Plan of Care (Addendum)
Patient AOX4, VSS throughout shift.  All meds given on time as ordered.  TPN on-going, pt denied pain.  Diminished lungs, IS encouraged.  BG maintained WDLs.  Wound output 259m frank blood this morning with a lot of blood clots.  Pt remains asymptomatic, VSS, provider notified.  BG 171 this morning.  POC maintained, will continue to monitor.  Problem: Clinical Measurements: Goal: Will remain free from infection Outcome: Progressing   Problem: Nutrition: Goal: Adequate nutrition will be maintained Outcome: Progressing   Problem: Activity: Goal: Risk for activity intolerance will decrease Outcome: Progressing   Problem: Nutrition: Goal: Adequate nutrition will be maintained Outcome: Progressing   Problem: Coping: Goal: Level of anxiety will decrease Outcome: Progressing   Problem: Pain Managment: Goal: General experience of comfort will improve Outcome: Progressing

## 2022-10-05 NOTE — Progress Notes (Signed)
Subjective:  CC: Debra [name] is a 57 y.o. female  Hospital stay day 234, 232 Days Post-Op s/p re-opening of laparotomy for repair of small bowel perforation following initial laparotomy, excision of greater omental mass, abdominal wall reconstruction with [name], appendectomy, and placement of Prevena vac on 06/08.   HPI: Additional report of clots within bag noted overnight.  Patient has no specific complaints.  ROS:  General: Denies weight loss, weight gain, fatigue, fevers, chills, and night sweats. Heart: Denies chest pain, palpitations, racing heart, irregular heartbeat, leg pain or swelling, and decreased activity tolerance. Respiratory: Denies breathing difficulty, shortness of breath, wheezing, cough, and sputum. GI: Denies change in appetite, heartburn, nausea, vomiting, constipation, diarrhea, and blood in stool. GU: Denies difficulty urinating, pain with urinating, urgency, frequency, blood in urine.   Objective:   Temp:  [97.6 F (36.4 C)-98.2 F (36.8 C)] 97.6 F (36.4 C) (01/28 0813) Pulse Rate:  [67-77] 67 (01/28 0813) Resp:  [15-20] 18 (01/28 0813) BP: (94-111)/(41-62) 109/56 (01/28 0813) SpO2:  [99 %-100 %] 100 % (01/28 0813) Weight:  [99 kg] 99 kg (01/28 0430)     Height: '4\' 11"'$  (149.9 cm) Weight: 99 kg BMI (Calculated): 43.04   Intake/Output this shift:   Intake/Output Summary (Last 24 hours) at 10/05/2022 1008 Last data filed at 10/05/2022 0740 Gross per 24 hour  Intake 1563.69 ml  Output 1000 ml  Net 563.69 ml    Constitutional :  alert, cooperative, appears stated age, and no distress  Respiratory:  clear to auscultation bilaterally  Cardiovascular:  regular rate and rhythm  Gastrointestinal: soft, non-tender; bowel sounds normal; no masses,  no organomegaly.  [name]'s pouch clean with no signs of additional bleeding this am  Skin: Cool and moist.   Psychiatric: Normal affect, non-agitated, not confused       LABS:     Latest  Ref Rng & Units 10/02/2022    8:36 AM 09/29/2022    6:15 AM 09/25/2022    9:37 AM  CMP  Glucose 70 - 99 mg/dL 151  148  151   BUN 6 - 20 mg/dL '21  19  20   '$ Creatinine 0.44 - 1.00 mg/dL 0.44  0.48  0.41   Sodium 135 - 145 mmol/L 134  137  137   Potassium 3.5 - 5.1 mmol/L 3.9  4.1  4.0   Chloride 98 - 111 mmol/L 106  107  105   CO2 22 - 32 mmol/L '24  24  27   '$ Calcium 8.9 - 10.3 mg/dL 7.9  8.2  8.3   Total Protein 6.5 - 8.1 g/dL 6.3  6.4  6.2   Total Bilirubin 0.3 - 1.2 mg/dL 0.6  0.7  0.5   Alkaline Phos 38 - 126 U/L 123  131  125   AST 15 - 41 U/L '26  25  23   '$ ALT 0 - 44 U/L '19  19  17       '$ Latest Ref Rng & Units 10/05/2022    8:18 AM 10/04/2022    7:30 AM 10/02/2022    8:36 AM  CBC  WBC 4.0 - 10.5 K/uL 2.6   2.8   Hemoglobin 12.0 - 15.0 g/dL 7.5  6.8  8.0   Hematocrit 36.0 - 46.0 % 24.0  22.3  26.0   Platelets 150 - 400 K/uL 100   82     RADS: N/a Assessment:   s/p re-opening of laparotomy for repair of small bowel perforation  following initial laparotomy, excision of greater omental mass, abdominal wall reconstruction with [name], appendectomy, and placement of Prevena vac on 06/08.   Intermittent bleeding episodes reported but no signs of active bleeding this a.m.  However, repeat blood work showed hemoglobin dropped to 6.8.  Status post 1 unit transfusion yesterday.  Appropriate increase in hemoglobin level noted this a.m.  Although there was report of bleeding overnight with some clots, no evidence of active bleeding or additional clots in bag this a.m.  Will continue to monitor.  Continue present management otherwise. labs/images/medications/previous chart entries reviewed personally and relevant changes/updates noted above.

## 2022-10-06 LAB — COMPREHENSIVE METABOLIC PANEL
ALT: 26 U/L (ref 0–44)
AST: 33 U/L (ref 15–41)
Albumin: 2.3 g/dL — ABNORMAL LOW (ref 3.5–5.0)
Alkaline Phosphatase: 123 U/L (ref 38–126)
Anion gap: 6 (ref 5–15)
BUN: 20 mg/dL (ref 6–20)
CO2: 24 mmol/L (ref 22–32)
Calcium: 8 mg/dL — ABNORMAL LOW (ref 8.9–10.3)
Chloride: 105 mmol/L (ref 98–111)
Creatinine, Ser: 0.45 mg/dL (ref 0.44–1.00)
GFR, Estimated: >60 mL/min (ref >60)
Glucose, Bld: 146 mg/dL — ABNORMAL HIGH (ref 70–99)
Potassium: 4.5 mmol/L (ref 3.5–5.1)
Sodium: 135 mmol/L (ref 135–145)
Total Bilirubin: 0.3 mg/dL (ref 0.3–1.2)
Total Protein: 6 g/dL — ABNORMAL LOW (ref 6.5–8.1)

## 2022-10-06 LAB — CBC
HCT: 23.7 % — ABNORMAL LOW (ref 36.0–46.0)
Hemoglobin: 7.4 g/dL — ABNORMAL LOW (ref 12.0–15.0)
MCH: 28 pg (ref 26.0–34.0)
MCHC: 31.2 g/dL (ref 30.0–36.0)
MCV: 89.8 fL (ref 80.0–100.0)
Platelets: 118 10*3/uL — ABNORMAL LOW (ref 150–400)
RBC: 2.64 MIL/uL — ABNORMAL LOW (ref 3.87–5.11)
RDW: 16.8 % — ABNORMAL HIGH (ref 11.5–15.5)
WBC: 2.9 10*3/uL — ABNORMAL LOW (ref 4.0–10.5)
nRBC: 0 % (ref 0.0–0.2)

## 2022-10-06 LAB — PHOSPHORUS: Phosphorus: 3.2 mg/dL (ref 2.5–4.6)

## 2022-10-06 LAB — GLUCOSE, CAPILLARY
Glucose-Capillary: 180 mg/dL — ABNORMAL HIGH (ref 70–99)
Glucose-Capillary: 171 mg/dL — ABNORMAL HIGH (ref 70–99)
Glucose-Capillary: 150 mg/dL — ABNORMAL HIGH (ref 70–99)
Glucose-Capillary: 109 mg/dL — ABNORMAL HIGH (ref 70–99)
Glucose-Capillary: 93 mg/dL (ref 70–99)
Glucose-Capillary: 149 mg/dL — ABNORMAL HIGH (ref 70–99)

## 2022-10-06 LAB — MAGNESIUM: Magnesium: 1.9 mg/dL (ref 1.7–2.4)

## 2022-10-06 LAB — TRIGLYCERIDES: Triglycerides: 85 mg/dL (ref <150)

## 2022-10-06 MED ORDER — ZINC CHLORIDE 1 MG/ML IV SOLN
INTRAVENOUS | Status: AC
  Filled 2022-10-06: qty 672

## 2022-10-06 NOTE — Progress Notes (Signed)
[facility] SURGICAL PROGRESS NOTE (cpt [id number])  Hospital Day(s): 235.   Post op day(s): 233 Days Post-Op.   Interval History:  Patient seen and examined Weekend events noted; did get 1 unit pRBCs No issues this AM Hgb pending Nutritional labs pending this AM [name] output 1200 ccs + unmeasured; managing effluent independently Advanced to FLD; still poor appetite On TPN She is ambulating better per RN  Review of Systems:  Constitutional: denies fever, chills  HEENT: denies cough or congestion  Respiratory: denies any shortness of breath  Cardiovascular: denies chest pain or palpitations  Gastrointestinal: + Abdominal pain, denies N/V Genitourinary: denies burning with urination or urinary frequency Integumentary: + midline wound (stable)   Vital signs in last 24 hours: [min-max] current  Temp:  [97.6 F (36.4 C)-98.3 F (36.8 C)] 98.2 F (36.8 C) (01/29 0313) Pulse Rate:  [67-80] 77 (01/29 0313) Resp:  [16-18] 18 (01/29 0313) BP: (100-109)/(46-63) 100/46 (01/29 0313) SpO2:  [99 %-100 %] 100 % (01/29 0313) Weight:  [99.1 kg] 99.1 kg (01/29 0341)     Height: '4\' 11"'$  (149.9 cm) Weight: 99.1 kg BMI (Calculated): 43.04   Intake/Output last 2 shifts:  01/28 0701 - 01/29 0700 In: 1706.2 [I.V.:1706.2] Out: 1200 [Drains:1200]   Physical Exam:  Constitutional: alert, cooperative and no distress  Respiratory: breathing non-labored at rest  Cardiovascular: regular rate and sinus rhythm  Gastrointestinal: Soft, rather unchanged suprapubic tenderness (mild), non-distended, no rebound/guarding. Integumentary:  Port removal site in right chest is healing via secondary intention; no erythema. Midline wound open, peritoneum closed; granulating; there are three stomatized areas visible in the LUQ and LLQ portions of the wound, no current oozing; suspect this is coming from raw hypergranulation tissue to the inferior wound edge Musculoskeletal: Trace edema to lower  extremities; no calf tenderness nor erythema. No other edema present    Labs:     Latest Ref Rng & Units 10/05/2022    8:18 AM 10/04/2022    7:30 AM 10/02/2022    8:36 AM  CBC  WBC 4.0 - 10.5 K/uL 2.6   2.8   Hemoglobin 12.0 - 15.0 g/dL 7.5  6.8  8.0   Hematocrit 36.0 - 46.0 % 24.0  22.3  26.0   Platelets 150 - 400 K/uL 100   82       Latest Ref Rng & Units 10/02/2022    8:36 AM 09/29/2022    6:15 AM 09/25/2022    9:37 AM  CMP  Glucose 70 - 99 mg/dL 151  148  151   BUN 6 - 20 mg/dL '21  19  20   '$ Creatinine 0.44 - 1.00 mg/dL 0.44  0.48  0.41   Sodium 135 - 145 mmol/L 134  137  137   Potassium 3.5 - 5.1 mmol/L 3.9  4.1  4.0   Chloride 98 - 111 mmol/L 106  107  105   CO2 22 - 32 mmol/L '24  24  27   '$ Calcium 8.9 - 10.3 mg/dL 7.9  8.2  8.3   Total Protein 6.5 - 8.1 g/dL 6.3  6.4  6.2   Total Bilirubin 0.3 - 1.2 mg/dL 0.6  0.7  0.5   Alkaline Phos 38 - 126 U/L 123  131  125   AST 15 - 41 U/L '26  25  23   '$ ALT 0 - 44 U/L '19  19  17     '$ Imaging studies: No new pertinent imaging studies this AM   Assessment/Plan:  57 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   - New 01/29: No acute issues; output cleared with slight tinged of old blood. Hgb pending (did get 1 unit PRBCs this weekend). Pain improved and ambulating much better.   - Wound Care ([name] Pouch); Appreciate WOC RN assistance. No longer on intermittent or continuous suction. Patient seems to like this better and has started to manage effluent independently. She will need intermittent suction with [place]auer to control effluent. Likely be beneficial to suction before ambulation.    - Continue FLD; trial soft diet when ready   - Continue daily dressing change of right  chest port site; okay to cover with Band-Aid or gauze with tape  - Resumed TPN; nutritional labs pending this morning - Appreciate dietary assistance with trace elements; iron supplementation            - Monitor abdominal examination; on-going bowel function            - Pain control prn; antemetic prn            - Progressed with therapies; no longer any recommendations  - Appreciate neurosurgery assistance with compression fracture; serial XRs   - Discharge Planning: Continue to anticipate lengthy admission +/- possible transfer; no new progress regarding disposition unfortunately   All of the above findings and recommendations were discussed with the patient, and the medical team, and all of patient's questions were answered to her expressed satisfaction.  -- [name], PA-C [facility] 10/06/2022, 7:15 AM M-F: 7am - 4pm

## 2022-10-06 NOTE — TOC Progression Note (Signed)
Transition of Care [facility]) - Progression Note    Patient Details  Name: Debra [name] MRN: [id number] Date of Birth: 01/21/1966  Transition of Care [facility]) CM/SW Contact  Beverly Sessions, RN Phone Number: 10/06/2022, 10:47 AM  Clinical Narrative:     Per surgery Continue FLD; trial soft diet when ready.  No update for disposition   Expected Discharge Plan: [place] Barriers to Discharge: Continued Medical Work up  Expected Discharge Plan and Services                                               Social Determinants of Health (SDOH) Interventions SDOH Screenings   Food Insecurity: No Food Insecurity (05/20/2022)  Housing: Low Risk  (05/20/2022)  Transportation Needs: No Transportation Needs (05/20/2022)  Utilities: Not At Risk (05/20/2022)  Tobacco Use: Low Risk  (09/12/2022)    Readmission Risk Interventions     No data to display

## 2022-10-06 NOTE — Progress Notes (Signed)
PHARMACY - TOTAL PARENTERAL NUTRITION CONSULT NOTE   Indication: Prolonged ileus  Patient Measurements: Height: '4\' 11"'$  (149.9 cm) Weight: 99.1 kg (218 lb 7.6 oz) IBW/kg (Calculated) : 43.2 TPN AdjBW (KG): 55.2 Body mass index is 44.13 kg/m.  Assessment: Debra [name] is a 57 y.o. female s/p laparotomy, excision of greater omental mass, abdominal wall reconstruction with [name], appendectomy, and placement of Prevena vac.  Glucose / Insulin:  --No apparent history of diabetes --BG 98-180 --rSSI 4x/day  --SSI: 14u last 24h  17 units insulin in TPN (decreased from 20 units on 1/21)  Electrolytes: WNL Renal: SCr stable at approximate baseline  Hepatic: LFTs wnl --bilirubin wnl GI Imaging: 9/11 CTAP: no new acute issues GI Surgeries / Procedures: s/p laparotomy, excision of greater omental mass, abdominal wall reconstruction with [name], appendectomy, and placement of Prevena vac  Central access: 02/15/22 TPN start date: 02/15/22  Nutritional Goals: Goal cyclic TPN over 16 hrs: cyclic TPN over 16 hrs: (provides 102 g of protein and 1811 kcals per day) total volume over 16hrs for calculations=1400 ml (1500 ml total with overfill)  RD Assessment:  Estimated Needs Total Energy Estimated Needs: 1800-2100kcal/day Total Protein Estimated Needs: 90-110g/d Total Fluid Estimated Needs: 1.4-1.6L/day  Current Nutrition:  CLD + nutritional supplements, not meeting PO needs yet   Plan: Transitioned to *Cyclic* TPN on 1/61.   to run over 16 hours per MD request. [name] at 2000 per discussion with Dietician to allow patient time for walking in afternoon/evenings.  To run over 16 hours: -Start rate at 49 mL/hr for 1 hour. -Increase rate to 98 mL/hr for 13 hours.  -Decrease rate to 49 mL/hr for 1 hour. -Decrease rate to 25 mL/hr for 1 hour, then stop.  Plan:  See cyclic TPN rate above Continue Nutritional Components Amino acids (using 15% Clinisol):  101 g Dextrose 19% = 266 g Lipids (using 20% SMOFlipids): 50.4 g kCal: 1817/24h  Electrolytes in TPN: Na 33mq/L, K 456m/L, Ca 1m37mL, Mg 10 mEq/L, Phos 10 mmol/L.  Continue ratio Cl: Acet 1:1  Continue MVI, trace elements, chromium and zinc per dietary recs. Continue CBG/SSI for Cyclic TPN:  -CBG 2 hrs after cyclic TPN start -CBG during middle of cyclic TPN infusion -CBG 1 hr after cylic TPN stopped -CBG while off TPN Continue resistant SSI  and continue insulin in TPN at 17 units (decreased from 20 units on 1/21) Check TPN labs on Mon/Thurs at minimum zinc 5 mg added to TPN 12/16 per dietician recommendation.  Sasha Rogel Rodriguez-Guzman PharmD, BCPS 10/06/2022 7:54 AM

## 2022-10-07 LAB — GLUCOSE, CAPILLARY
Glucose-Capillary: 158 mg/dL — ABNORMAL HIGH (ref 70–99)
Glucose-Capillary: 135 mg/dL — ABNORMAL HIGH (ref 70–99)
Glucose-Capillary: 84 mg/dL (ref 70–99)
Glucose-Capillary: 153 mg/dL — ABNORMAL HIGH (ref 70–99)

## 2022-10-07 MED ORDER — FUROSEMIDE 10 MG/ML IJ SOLN
20.0000 mg | Freq: Once | INTRAMUSCULAR | Status: AC
  Administered 2022-10-07: 20 mg via INTRAVENOUS
  Filled 2022-10-07: qty 4

## 2022-10-07 MED ORDER — ZINC CHLORIDE 1 MG/ML IV SOLN
INTRAVENOUS | Status: AC
  Filled 2022-10-07: qty 672

## 2022-10-07 NOTE — Progress Notes (Signed)
Nutrition Follow-up  DOCUMENTATION CODES:   Obesity unspecified  INTERVENTION:   Continue cyclic TPN per pharmacy (16 hrs)- provides 1811kcal/day and 102g/day protein   Ensure Enlive po BID, each supplement provides 350 kcal and 20 grams of protein.  MVI, chromium and trace elements in TPN   Daily weights   Vitamin D2- 50,000 units po weekly   Zinc '2mg'$  daily added to TPN   Will recheck routine vitamin labs 2/8   NUTRITION DIAGNOSIS:   Increased nutrient needs related to wound healing, catabolic illness as evidenced by estimated needs.  GOAL:   Patient will meet greater than or equal to 90% of their needs -met with TPN   MONITOR:   PO intake, Supplement acceptance, Labs, Weight trends, Diet advancement, I & O's, TPN  ASSESSMENT:   57 y/o female with h/o hypothyroidism, COVID 19 (3/21), kidney stones and stage 3 colon cancer (s/p left hemicolectomy 5/21 and chemotherapy) who is admitted for new pelvic mass now s/p laparotomy 6/8 (with excision of pelvic mass from greater omentum, abdominal wall reconstruction with bilateral myocutaneous flaps and mesh, incisional hernia repair, appendectomy repair and VAC placement) complicated by bowel perforation s/p reopening of recent laparotomy 6/10 (with repair of small bowel perforation, excision of mesh, placement of two phasix mesh and VAC placement). Pathology returned as metastatic adenocarcinoma. Pt with L1 compression fracture.   Pt continues to tolerate TPN well at goal rate; TPN is currently being cycled for 16 hrs. Triglycerides wnl and are being checked monthly. Hyperglycemia improved with insulin in TPN. Pt up ~20lbs since admission; PA aware. Will check routine vitamin labs every two months as pt on chronic TPN. Pt continues to have poor oral intake; pt eating <25% of meals. Pt remains on FL diet. [name] pouch; pouch with 663m output. No discharge plan in place.   Medications reviewed and include: D2, insulin, synthroid,  protonix, TPN  -Selenium 93(L), Chromium 2.3(H), copper 118 wnl, Manganese 18.6(H) - 12/7 -zinc 95 wnl, vitamin D 49.04 wnl, Iodine 450.1(H)- 1/9 -Iron 32.0 wnl, TIBC 330, ferritin 54 wnl, folate 14.4 wnl, B12 769- 12/7 -Vitamin B1- 91.2, vitamin A- 21.5, B6- 22.8 wnl, vitamin C- 0.5, vitamin E- 12.7 wnl, vitamin K 0.82- 12/7  -urinary iodine- 3257ug/L(H)  Labs reviewed: Na 135 wnl, K 4.5 wnl, P 3.2 wnl, Mg 1.9 wnl- 1/29 Triglycerides- 85-1/29 Wbc- 2.9(L), Hgb 7.4(L), Hct 23.7(L) Cbgs- 135, 158 x 24 hrs  Diet Order:    Diet Order             Diet full liquid Fluid consistency: Thin  Diet effective now                  EDUCATION NEEDS:   Not appropriate for education at this time  Skin:  Skin Assessment: Reviewed RN Assessment ( Midline wound: 11cm x 12cm )  Last BM:  1/27- type 7  Height:   Ht Readings from Last 1 Encounters:  03/01/22 '4\' 11"'$  (1.499 m)    Weight:   Wt Readings from Last 1 Encounters:  10/07/22 98.7 kg    Ideal Body Weight:  44.3 kg  BMI:  Body mass index is 43.95 kg/m.  Estimated Nutritional Needs:   Kcal:  1800-2100kcal/day  Protein:  90-110g/d  Fluid:  1.4-1.6L/day  CKoleen DistanceMS, RD, LDN Please refer to [facility]for RD and/or RD on-call/weekend/after hours pager

## 2022-10-07 NOTE — Progress Notes (Signed)
PHARMACY - TOTAL PARENTERAL NUTRITION CONSULT NOTE   Indication: Prolonged ileus  Patient Measurements: Height: '4\' 11"'$  (149.9 cm) Weight: 98.7 kg (217 lb 9.5 oz) IBW/kg (Calculated) : 43.2 TPN AdjBW (KG): 55.2 Body mass index is 43.95 kg/m.  Assessment: Debra [name] is a 57 y.o. female s/p laparotomy, excision of greater omental mass, abdominal wall reconstruction with [name], appendectomy, and placement of Prevena vac.  Glucose / Insulin:  --No apparent history of diabetes --BG 93 - 158 --rSSI 4x/day  --SSI: 7u last 24h  17 units insulin in TPN (decreased from 20 units on 1/21)  Electrolytes: WNL Renal: SCr stable at approximate baseline  Hepatic: LFTs wnl --bilirubin wnl GI Imaging: 9/11 CTAP: no new acute issues GI Surgeries / Procedures: s/p laparotomy, excision of greater omental mass, abdominal wall reconstruction with [name], appendectomy, and placement of Prevena vac  Central access: 02/15/22 TPN start date: 02/15/22  Nutritional Goals: Goal cyclic TPN over 16 hrs: cyclic TPN over 16 hrs: (provides 102 g of protein and 1811 kcals per day) total volume over 16hrs for calculations=1400 ml (1500 ml total with overfill)  RD Assessment:  Estimated Needs Total Energy Estimated Needs: 1800-2100kcal/day Total Protein Estimated Needs: 90-110g/d Total Fluid Estimated Needs: 1.4-1.6L/day  Current Nutrition:  CLD + nutritional supplements, not meeting PO needs yet   Plan: Transitioned to *Cyclic* TPN on 4/09.   to run over 16 hours per MD request. [name] at 2000 per discussion with Dietician to allow patient time for walking in afternoon/evenings.  To run over 16 hours: -Start rate at 49 mL/hr for 1 hour. -Increase rate to 98 mL/hr for 13 hours.  -Decrease rate to 49 mL/hr for 1 hour. -Decrease rate to 25 mL/hr for 1 hour, then stop.  Plan:  See cyclic TPN rate above Continue Nutritional Components Amino acids (using 15% Clinisol):  101 g Dextrose 19% = 266 g Lipids (using 20% SMOFlipids): 50.4 g kCal: 1817/24h  Electrolytes in TPN: Na 28mq/L, K 467m/L, Ca 97m21mL, Mg 10 mEq/L, Phos 10 mmol/L.  Continue ratio Cl: Acet 1:1  Continue MVI, trace elements, chromium and zinc per dietary recs. Continue CBG/SSI for Cyclic TPN:  -CBG 2 hrs after cyclic TPN start -CBG during middle of cyclic TPN infusion -CBG 1 hr after cylic TPN stopped -CBG while off TPN Continue resistant SSI  and continue insulin in TPN at 17 units (decreased from 20 units on 1/21) Check TPN labs on Mon/Thurs at minimum zinc 5 mg added to TPN 12/16 per dietician recommendation.  RodVallery SaharmD, BCPS 10/07/2022 7:19 AM

## 2022-10-07 NOTE — Progress Notes (Signed)
[facility] SURGICAL PROGRESS NOTE (cpt [phone])  Hospital Day(s): 236.   Post op day(s): 234 Days Post-Op.   Interval History:  Patient seen and examined Nothing overnight No issues this AM [name] output 625 ccs + unmeasured; managing effluent independently Advanced to FLD; still poor appetite On TPN She is ambulating better per RN  Review of Systems:  Constitutional: denies fever, chills  HEENT: denies cough or congestion  Respiratory: denies any shortness of breath  Cardiovascular: denies chest pain or palpitations  Gastrointestinal: + Abdominal pain, denies N/V Genitourinary: denies burning with urination or urinary frequency Integumentary: + midline wound (stable)   Vital signs in last 24 hours: [min-max] current  Temp:  [98 F (36.7 C)-98.3 F (36.8 C)] 98 F (36.7 C) (01/30 0528) Pulse Rate:  [72-77] 72 (01/30 0528) Resp:  [16-18] 17 (01/30 0528) BP: (101-109)/(51-59) 109/59 (01/30 0528) SpO2:  [98 %-100 %] 98 % (01/30 0528) Weight:  [98.7 kg] 98.7 kg (01/30 0528)     Height: '4\' 11"'$  (149.9 cm) Weight: 98.7 kg BMI (Calculated): 43.04   Intake/Output last 2 shifts:  01/29 0701 - 01/30 0700 In: 1049.5 [I.V.:1049.5] Out: 625 [Drains:625]   Physical Exam:  Constitutional: alert, cooperative and no distress  Respiratory: breathing non-labored at rest  Cardiovascular: regular rate and sinus rhythm  Gastrointestinal: Soft, rather unchanged suprapubic tenderness (mild), non-distended, no rebound/guarding. Integumentary:  Port removal site in right chest is healing via secondary intention; no erythema. Midline wound open, peritoneum closed; granulating; there are three stomatized areas visible in the LUQ and LLQ portions of the wound, no current oozing; suspect this is coming from raw hypergranulation tissue to the inferior wound edge Musculoskeletal: Trace edema to lower extremities; no calf tenderness nor erythema. No other edema present    Labs:      Latest Ref Rng & Units 10/06/2022    8:07 AM 10/05/2022    8:18 AM 10/04/2022    7:30 AM  CBC  WBC 4.0 - 10.5 K/uL 2.9  2.6    Hemoglobin 12.0 - 15.0 g/dL 7.4  7.5  6.8   Hematocrit 36.0 - 46.0 % 23.7  24.0  22.3   Platelets 150 - 400 K/uL 118  100        Latest Ref Rng & Units 10/06/2022    8:07 AM 10/02/2022    8:36 AM 09/29/2022    6:15 AM  CMP  Glucose 70 - 99 mg/dL 146  151  148   BUN 6 - 20 mg/dL '20  21  19   '$ Creatinine 0.44 - 1.00 mg/dL 0.45  0.44  0.48   Sodium 135 - 145 mmol/L 135  134  137   Potassium 3.5 - 5.1 mmol/L 4.5  3.9  4.1   Chloride 98 - 111 mmol/L 105  106  107   CO2 22 - 32 mmol/L '24  24  24   '$ Calcium 8.9 - 10.3 mg/dL 8.0  7.9  8.2   Total Protein 6.5 - 8.1 g/dL 6.0  6.3  6.4   Total Bilirubin 0.3 - 1.2 mg/dL 0.3  0.6  0.7   Alkaline Phos 38 - 126 U/L 123  123  131   AST 15 - 41 U/L 33  26  25   ALT 0 - 44 U/L '26  19  19     '$ Imaging studies: No new pertinent imaging studies this AM   Assessment/Plan:  57 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   - New 01/30: No acute issues; H&H stable   - Wound Care ([name] Pouch); Appreciate WOC RN assistance. No longer on intermittent or continuous suction. Patient seems to like this better and has started to manage effluent independently. She will need intermittent suction with Yankauer to control effluent. Likely be beneficial to suction before ambulation.    - Continue FLD; trial soft diet when ready   - Continue daily dressing change of right chest port site; okay to cover with Band-Aid or gauze with tape  - Resumed TPN; nutritional labs pending this morning - Appreciate dietary assistance with trace elements; iron supplementation            -  Monitor abdominal examination; on-going bowel function            - Pain control prn; antemetic prn            - Progressed with therapies; no longer any recommendations  - Appreciate neurosurgery assistance with compression fracture; serial XRs   - Discharge Planning: Continue to anticipate lengthy admission +/- possible transfer; no new progress regarding disposition unfortunately   All of the above findings and recommendations were discussed with the patient, and the medical team, and all of patient's questions were answered to her expressed satisfaction.  -- [name], PA-C [facility] 10/07/2022, 7:09 AM M-F: 7am - 4pm

## 2022-10-08 ENCOUNTER — Inpatient Hospital Stay: Payer: Medicaid Other

## 2022-10-08 LAB — GLUCOSE, CAPILLARY
Glucose-Capillary: 163 mg/dL — ABNORMAL HIGH (ref 70–99)
Glucose-Capillary: 135 mg/dL — ABNORMAL HIGH (ref 70–99)
Glucose-Capillary: 110 mg/dL — ABNORMAL HIGH (ref 70–99)
Glucose-Capillary: 206 mg/dL — ABNORMAL HIGH (ref 70–99)

## 2022-10-08 LAB — CBC
HCT: 22.3 % — ABNORMAL LOW (ref 36.0–46.0)
Hemoglobin: 7.1 g/dL — ABNORMAL LOW (ref 12.0–15.0)
MCH: 28.6 pg (ref 26.0–34.0)
MCHC: 31.8 g/dL (ref 30.0–36.0)
MCV: 89.9 fL (ref 80.0–100.0)
Platelets: 121 10*3/uL — ABNORMAL LOW (ref 150–400)
RBC: 2.48 MIL/uL — ABNORMAL LOW (ref 3.87–5.11)
RDW: 16.8 % — ABNORMAL HIGH (ref 11.5–15.5)
WBC: 3.3 10*3/uL — ABNORMAL LOW (ref 4.0–10.5)
nRBC: 0 % (ref 0.0–0.2)

## 2022-10-08 LAB — PREPARE RBC (CROSSMATCH)

## 2022-10-08 MED ORDER — SODIUM CHLORIDE 0.9% IV SOLUTION: Freq: Once | INTRAVENOUS | Status: AC

## 2022-10-08 MED ORDER — ZINC CHLORIDE 1 MG/ML IV SOLN
INTRAVENOUS | Status: AC
  Filled 2022-10-08: qty 672

## 2022-10-08 NOTE — Progress Notes (Signed)
I reviewed the patients xrays from today which show a stable L1 compression fracture. This was communicated with the primary team. We will see her going on an as needed basis.   Cooper Render PA-C Neurosurgery

## 2022-10-08 NOTE — TOC Progression Note (Signed)
Transition of Care [facility]) - Progression Note    Patient Details  Name: Debra [name] MRN: [id number] Date of Birth: [birth year]  Transition of Care [facility]) CM/SW Contact  Beverly Sessions, RN Phone Number: 10/08/2022, 11:37 AM  Clinical Narrative:     Plan for transfusion today   Expected Discharge Plan: [place] Barriers to Discharge: Continued Medical Work up  Expected Discharge Plan and Services                                               Social Determinants of Health (SDOH) Interventions SDOH Screenings   Food Insecurity: No Food Insecurity (05/20/2022)  Housing: Low Risk  (05/20/2022)  Transportation Needs: No Transportation Needs (05/20/2022)  Utilities: Not At Risk (05/20/2022)  Tobacco Use: Low Risk  (09/12/2022)    Readmission Risk Interventions     No data to display

## 2022-10-08 NOTE — Progress Notes (Signed)
PHARMACY - TOTAL PARENTERAL NUTRITION CONSULT NOTE   Indication: Prolonged ileus  Patient Measurements: Height: '4\' 11"'$  (149.9 cm) Weight: 97.8 kg (215 lb 9.8 oz) IBW/kg (Calculated) : 43.2 TPN AdjBW (KG): 55.2 Body mass index is 43.55 kg/m.  Assessment: Debra [name] is a 57 y.o. female s/p laparotomy, excision of greater omental mass, abdominal wall reconstruction with [name], appendectomy, and placement of Prevena vac.  Glucose / Insulin:  --No apparent history of diabetes --BG 84 - 163 --rSSI 4x/day  --SSI: 11u last 24h  17 units insulin in TPN (decreased from 20 units on 1/21)  Electrolytes: WNL Renal: SCr stable at approximate baseline  Hepatic: LFTs wnl --bilirubin wnl GI Imaging: 9/11 CTAP: no new acute issues GI Surgeries / Procedures: s/p laparotomy, excision of greater omental mass, abdominal wall reconstruction with [name], appendectomy, and placement of Prevena vac  Central access: 02/15/22 TPN start date: 02/15/22  Nutritional Goals: Goal cyclic TPN over 16 hrs: cyclic TPN over 16 hrs: (provides 102 g of protein and 1811 kcals per day) total volume over 16hrs for calculations=1400 ml (1500 ml total with overfill)  RD Assessment:  Estimated Needs Total Energy Estimated Needs: 1800-2100kcal/day Total Protein Estimated Needs: 90-110g/d Total Fluid Estimated Needs: 1.4-1.6L/day  Current Nutrition:  CLD + nutritional supplements, not meeting PO needs yet   Plan: Transitioned to *Cyclic* TPN on 1/61/09.   to run over 16 hours per MD request. [name] at 2000 per discussion with Dietician to allow patient time for walking in afternoon/evenings.  To run over 16 hours: -Start rate at 49 mL/hr for 1 hour. -Increase rate to 98 mL/hr for 13 hours.  -Decrease rate to 49 mL/hr for 1 hour. -Decrease rate to 25 mL/hr for 1 hour, then stop.  Plan:  See cyclic TPN rate above Continue Nutritional Components Amino acids (using 15%  Clinisol): 101 g Dextrose 19% = 266 g Lipids (using 20% SMOFlipids): 50.4 g kCal: 1817/24h  Electrolytes in TPN: Na 50mq/L, K 4160m/L, Ca 60m73mL, Mg 10 mEq/L, Phos 10 mmol/L.  Continue ratio Cl: Acet 1:1  Continue MVI, trace elements, chromium and zinc per dietary recs. Continue CBG/SSI for Cyclic TPN:  -CBG 2 hrs after cyclic TPN start -CBG during middle of cyclic TPN infusion -CBG 1 hr after cylic TPN stopped -CBG while off TPN Continue resistant SSI  and continue insulin in TPN at 17 units (decreased from 20 units on 1/21) Check TPN labs on Mon/Thurs at minimum zinc 5 mg added to TPN 12/16 per dietician recommendation.  RodVallery SaharmD, BCPS 10/08/2022 7:06 AM

## 2022-10-08 NOTE — Progress Notes (Addendum)
Noticed a bright and dark red output on pt Debra [name] pouch. MD [place] page.Pt asymptomatic. Will continue to monitor.  Update 0227: Spoke with MD Piscoy and states will place order. Will continue to monitor.  Update 0227: MD Piscoya placed order for CBC at 0500. Will continue to monitor.  Update 0333: Pt hemoglobin at 7.1. MD [place] page. Will continue to monitor.  Update 0338: MD Piscoya states will place order. Will continue to monitor.  Update 0345: Wells Guiles spanish interpreter was utilized for possible blood transfusion. Will continue to monitor.  Update 0541: Blood and type and screen page MD Piscoya but no new order place. MD States" I will let the provider in the morning shift to decide if patients will need blood transfusion." Will notify incoming shift. Will continue to monitor.

## 2022-10-08 NOTE — Consult Note (Signed)
[place] Nurse wound follow up Wound type:Midline EC wound with stomatized fistulae (3) Current pouch applied on 1/23 is intact.   I have corresponded with Surgical PA-C Z. Delena Bali and Bedside RN O. Rogers via [employer]. Both report that the system does not require changing today and that there are no supply needs at this time as there are two systems in the room.  See note from Z. Schultz's Encounter earlier today where oozing from open areas was treated with Arista powder effectively.   [place] nursing team will continue to follow, and will remain available to this patient, the nursing, surgical and medical teams.   Thank you for inviting [name] to participate in this patient's Plan of Care.  Maudie Flakes, MSN, RN, CNS, [place], Serita Grammes, [employer], [employer] phone:  [phone]

## 2022-10-08 NOTE — Progress Notes (Signed)
[facility] SURGICAL PROGRESS NOTE (cpt [phone])  Hospital Day(s): 237.   Post op day(s): 235 Days Post-Op.   Interval History:  Patient seen and examined Overnight again with oozing from [name] pouch; looks like a small spot oozing from the lower left quadrant of the wound near stomatized fistula.  Hgb to 7.1 (7.5 --> 7.4 --> 7.1 in last 48 to 72 hours) [name] output 650 ccs + unmeasured; managing effluent independently Advanced to FLD; still poor appetite On TPN She is ambulating better per RN  Review of Systems:  Constitutional: denies fever, chills  HEENT: denies cough or congestion  Respiratory: denies any shortness of breath  Cardiovascular: denies chest pain or palpitations  Gastrointestinal: + Abdominal pain, denies N/V Genitourinary: denies burning with urination or urinary frequency Integumentary: + midline wound (stable)   Vital signs in last 24 hours: [min-max] current  Temp:  [98 F (36.7 C)-98.7 F (37.1 C)] 98.2 F (36.8 C) (01/31 0457) Pulse Rate:  [71-82] 72 (01/31 0549) Resp:  [17-20] 20 (01/31 0457) BP: (91-110)/(49-63) 99/58 (01/31 0549) SpO2:  [99 %-100 %] 100 % (01/31 0457) Weight:  [97.8 kg] 97.8 kg (01/31 0549)     Height: '4\' 11"'$  (149.9 cm) Weight: 97.8 kg BMI (Calculated): 43.04   Intake/Output last 2 shifts:  01/30 0701 - 01/31 0700 In: 1134.8 [P.O.:240; I.V.:894.8] Out: 650 [Drains:650]   Physical Exam:  Constitutional: alert, cooperative and no distress  Respiratory: breathing non-labored at rest  Cardiovascular: regular rate and sinus rhythm  Gastrointestinal: Soft, rather unchanged suprapubic tenderness (mild), non-distended, no rebound/guarding. Integumentary:  Port removal site in right chest is healing via secondary intention; no erythema. Midline wound open, peritoneum closed; granulating; there are three stomatized areas visible in the LUQ and LLQ portions of the wound, there is one visible spot of oozing from the left  lower quadrant of this wound near stomatized fistula, this does appear somewhat deep under granulation tissue. I did attempt to hold pressure without adequate hemostasis.  Musculoskeletal: Trace edema to lower extremities; no calf tenderness nor erythema. No other edema present    Labs:     Latest Ref Rng & Units 10/08/2022    3:00 AM 10/06/2022    8:07 AM 10/05/2022    8:18 AM  CBC  WBC 4.0 - 10.5 K/uL 3.3  2.9  2.6   Hemoglobin 12.0 - 15.0 g/dL 7.1  7.4  7.5   Hematocrit 36.0 - 46.0 % 22.3  23.7  24.0   Platelets 150 - 400 K/uL 121  118  100       Latest Ref Rng & Units 10/06/2022    8:07 AM 10/02/2022    8:36 AM 09/29/2022    6:15 AM  CMP  Glucose 70 - 99 mg/dL 146  151  148   BUN 6 - 20 mg/dL '20  21  19   '$ Creatinine 0.44 - 1.00 mg/dL 0.45  0.44  0.48   Sodium 135 - 145 mmol/L 135  134  137   Potassium 3.5 - 5.1 mmol/L 4.5  3.9  4.1   Chloride 98 - 111 mmol/L 105  106  107   CO2 22 - 32 mmol/L '24  24  24   '$ Calcium 8.9 - 10.3 mg/dL 8.0  7.9  8.2   Total Protein 6.5 - 8.1 g/dL 6.0  6.3  6.4   Total Bilirubin 0.3 - 1.2 mg/dL 0.3  0.6  0.7   Alkaline Phos 38 - 126 U/L 123  123  131   AST 15 - 41 U/L 33  26  25   ALT 0 - 44 U/L '26  19  19     '$ Imaging studies: No new pertinent imaging studies this AM   Assessment/Plan:  57 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   - New 01/31: Oozing again overnight; I was able to find one visible spot of oozing from the left lower quadrant of this wound near stomatized fistula, this does appear somewhat deep under granulation tissue. I did attempt to hold pressure without adequate hemostasis. I will get arista vs surgicel from OR in attempts at hemostasis; given its location the  exact area is difficult to visualize. Will give 1 unit pRBCs as well.   - Wound Care ([name] Pouch); Appreciate WOC RN assistance. No longer on intermittent or continuous suction. Patient seems to like this better and has started to manage effluent independently. She will need intermittent suction with Yankauer to control effluent. Likely be beneficial to suction before ambulation.    - Continue FLD; trial soft diet when ready   - Continue daily dressing change of right chest port site; okay to cover with Band-Aid or gauze with tape  - Resumed TPN; nutritional labs pending this morning - Appreciate dietary assistance with trace elements; iron supplementation            - Monitor abdominal examination; on-going bowel function            - Pain control prn; antemetic prn            - Progressed with therapies; no longer any recommendations  - Appreciate neurosurgery assistance with compression fracture; serial XRs   - Discharge Planning: Continue to anticipate lengthy admission +/- possible transfer; no new progress regarding disposition unfortunately   All of the above findings and recommendations were discussed with the patient, and the medical team, and all of patient's questions were answered to her expressed satisfaction.  -- [name], PA-C  [facility] 10/08/2022, 7:32 AM M-F: 7am - 4pm

## 2022-10-09 LAB — COMPREHENSIVE METABOLIC PANEL
ALT: 26 U/L (ref 0–44)
AST: 33 U/L (ref 15–41)
Albumin: 2.7 g/dL — ABNORMAL LOW (ref 3.5–5.0)
Alkaline Phosphatase: 142 U/L — ABNORMAL HIGH (ref 38–126)
Anion gap: 5 (ref 5–15)
BUN: 23 mg/dL — ABNORMAL HIGH (ref 6–20)
CO2: 24 mmol/L (ref 22–32)
Calcium: 8 mg/dL — ABNORMAL LOW (ref 8.9–10.3)
Chloride: 107 mmol/L (ref 98–111)
Creatinine, Ser: 0.35 mg/dL — ABNORMAL LOW (ref 0.44–1.00)
GFR, Estimated: >60 mL/min (ref >60)
Glucose, Bld: 136 mg/dL — ABNORMAL HIGH (ref 70–99)
Potassium: 3.6 mmol/L (ref 3.5–5.1)
Sodium: 136 mmol/L (ref 135–145)
Total Bilirubin: 0.5 mg/dL (ref 0.3–1.2)
Total Protein: 6.4 g/dL — ABNORMAL LOW (ref 6.5–8.1)

## 2022-10-09 LAB — TYPE AND SCREEN
ABO/RH(D): O POS
Antibody Screen: NEGATIVE
Unit division: 0

## 2022-10-09 LAB — BPAM RBC
Blood Product Expiration Date: 202403062359
ISSUE DATE / TIME: 202401311533
Unit Type and Rh: 5100

## 2022-10-09 LAB — CBC
HCT: 26.8 % — ABNORMAL LOW (ref 36.0–46.0)
Hemoglobin: 8.6 g/dL — ABNORMAL LOW (ref 12.0–15.0)
MCH: 27.8 pg (ref 26.0–34.0)
MCHC: 32.1 g/dL (ref 30.0–36.0)
MCV: 86.7 fL (ref 80.0–100.0)
Platelets: 121 10*3/uL — ABNORMAL LOW (ref 150–400)
RBC: 3.09 MIL/uL — ABNORMAL LOW (ref 3.87–5.11)
RDW: 17.7 % — ABNORMAL HIGH (ref 11.5–15.5)
WBC: 3.9 10*3/uL — ABNORMAL LOW (ref 4.0–10.5)
nRBC: 0 % (ref 0.0–0.2)

## 2022-10-09 LAB — GLUCOSE, CAPILLARY
Glucose-Capillary: 158 mg/dL — ABNORMAL HIGH (ref 70–99)
Glucose-Capillary: 127 mg/dL — ABNORMAL HIGH (ref 70–99)
Glucose-Capillary: 124 mg/dL — ABNORMAL HIGH (ref 70–99)
Glucose-Capillary: 105 mg/dL — ABNORMAL HIGH (ref 70–99)

## 2022-10-09 LAB — PHOSPHORUS: Phosphorus: 3.1 mg/dL (ref 2.5–4.6)

## 2022-10-09 LAB — MAGNESIUM: Magnesium: 2 mg/dL (ref 1.7–2.4)

## 2022-10-09 LAB — TRIGLYCERIDES: Triglycerides: 100 mg/dL (ref <150)

## 2022-10-09 MED ORDER — ZINC CHLORIDE 1 MG/ML IV SOLN
INTRAVENOUS | Status: AC
  Filled 2022-10-09: qty 672

## 2022-10-09 NOTE — Progress Notes (Signed)
Mobility Specialist - Progress Note   10/09/22 1446  Mobility  Activity Ambulated independently in hallway  Level of Assistance Modified independent, requires aide device or extra time  Assistive Device None  Distance Ambulated (ft) 160 ft  Activity Response Tolerated well  $Mobility charge 1 Mobility   Pt amb one lap around NS ModI, tolerated well. Pt returned to room, left amb with needs within reach.   Candie Mile Mobility Specialist 10/09/22 2:47 PM

## 2022-10-09 NOTE — Progress Notes (Signed)
[facility] SURGICAL PROGRESS NOTE (cpt [id number])  Hospital Day(s): 238.   Post op day(s): 236 Days Post-Op.   Interval History:  Patient seen and examined No further oozing Hgb responded to 8.6; did get 1 unit pRBCs yesterday (01/31) Nutritional labs are reassuring [name] output 610 ccs + unmeasured; managing effluent independently Advanced to FLD; still poor appetite On TPN She is ambulating better per RN  Review of Systems:  Constitutional: denies fever, chills  HEENT: denies cough or congestion  Respiratory: denies any shortness of breath  Cardiovascular: denies chest pain or palpitations  Gastrointestinal: + Abdominal pain, denies N/V Genitourinary: denies burning with urination or urinary frequency Integumentary: + midline wound (stable)   Vital signs in last 24 hours: [min-max] current  Temp:  [97.7 F (36.5 C)-98.4 F (36.9 C)] 98.4 F (36.9 C) (02/01 0443) Pulse Rate:  [71-85] 71 (02/01 0443) Resp:  [16-18] 18 (02/01 0443) BP: (96-124)/(50-83) 96/56 (02/01 0443) SpO2:  [96 %-100 %] 96 % (02/01 0443) Weight:  [96.4 kg] 96.4 kg (02/01 0443)     Height: '4\' 11"'$  (149.9 cm) Weight: 96.4 kg BMI (Calculated): 43.04   Intake/Output last 2 shifts:  01/31 0701 - 02/01 0700 In: 817.5 [P.O.:120; I.V.:337.5; Blood:360] Out: 610 [Drains:610]   Physical Exam:  Constitutional: alert, cooperative and no distress  Respiratory: breathing non-labored at rest  Cardiovascular: regular rate and sinus rhythm  Gastrointestinal: Soft, rather unchanged suprapubic tenderness (mild), non-distended, no rebound/guarding. Integumentary:  Port removal site in right chest is healing via secondary intention; no erythema. Midline wound open, peritoneum closed; granulating; there are three stomatized areas visible in the LUQ and LLQ portions of the wound, no more oozing  Musculoskeletal: Trace edema to lower extremities; no calf tenderness nor erythema. No other edema present     Labs:     Latest Ref Rng & Units 10/09/2022    5:43 AM 10/08/2022    3:00 AM 10/06/2022    8:07 AM  CBC  WBC 4.0 - 10.5 K/uL 3.9  3.3  2.9   Hemoglobin 12.0 - 15.0 g/dL 8.6  7.1  7.4   Hematocrit 36.0 - 46.0 % 26.8  22.3  23.7   Platelets 150 - 400 K/uL 121  121  118       Latest Ref Rng & Units 10/09/2022    5:43 AM 10/06/2022    8:07 AM 10/02/2022    8:36 AM  CMP  Glucose 70 - 99 mg/dL 136  146  151   BUN 6 - 20 mg/dL '23  20  21   '$ Creatinine 0.44 - 1.00 mg/dL 0.35  0.45  0.44   Sodium 135 - 145 mmol/L 136  135  134   Potassium 3.5 - 5.1 mmol/L 3.6  4.5  3.9   Chloride 98 - 111 mmol/L 107  105  106   CO2 22 - 32 mmol/L '24  24  24   '$ Calcium 8.9 - 10.3 mg/dL 8.0  8.0  7.9   Total Protein 6.5 - 8.1 g/dL 6.4  6.0  6.3   Total Bilirubin 0.3 - 1.2 mg/dL 0.5  0.3  0.6   Alkaline Phos 38 - 126 U/L 142  123  123   AST 15 - 41 U/L 33  33  26   ALT 0 - 44 U/L '26  26  19     '$ Imaging studies: No new pertinent imaging studies this AM   Assessment/Plan:  57 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   - New 02/01: No further oozing, Hgb responded to transfusion on 01/31. Will monitor. No new issues   - Wound Care ([name] Pouch); Appreciate WOC RN assistance. No longer on intermittent or continuous suction. Patient seems to like this better and has started to manage effluent independently. She will need intermittent suction with Yankauer to control effluent. Likely be beneficial to suction before ambulation.    - Will liberalize diet   - Continue daily dressing change of right chest port site; okay to cover with Band-Aid or gauze with tape  - Resumed TPN; nutritional labs pending this morning - Appreciate dietary assistance  with trace elements; iron supplementation            - Monitor abdominal examination; on-going bowel function            - Pain control prn; antemetic prn            - Progressed with therapies; no longer any recommendations  - Appreciate neurosurgery assistance with compression fracture; serial XRs   - Discharge Planning: Continue to anticipate lengthy admission +/- possible transfer; no new progress regarding disposition unfortunately   All of the above findings and recommendations were discussed with the patient, and the medical team, and all of patient's questions were answered to her expressed satisfaction.  -- [name], PA-C [facility] 10/09/2022, 7:20 AM M-F: 7am - 4pm

## 2022-10-09 NOTE — Plan of Care (Signed)
  Problem: Nutrition: Goal: Adequate nutrition will be maintained Outcome: Progressing   Problem: Activity: Goal: Risk for activity intolerance will decrease Outcome: Progressing   Problem: Pain Managment: Goal: General experience of comfort will improve Outcome: Progressing

## 2022-10-09 NOTE — Consult Note (Signed)
[facility] Nurse wound follow up Wound type: midline EC fistula  [name] pouch leaked 10/09/23 overnight and again 10/09/22 at 1000  Bedside RN changed and new pouch is intact  patient is tearful and does not wish pouch to be changed again.  I have sent secure chat to Z. Olean Ree, PA to request a photo be added to the chart in case the [facility] team has any additional recommendations to maintain seal.  PA does not feel the oozing is causing the leak.  He agrees to get photo at first opportunity.  There are 2 additional [name] pouches in the room.  Wound bed: Oozing noted in LLQ.  Surgical hemostatic powder applied yesterday and this seems to be stable.  Drainage (amount, consistency, odor) liquid green feculent effluent in pouch  (approx 75 Ml. )  Will follow and WOC team will see patient every 7-10 days and as needed.  Estrellita Ludwig MSN, RN, FNP-BC CWON Wound, Ostomy, Continence Nurse [facility] [phone] Pager [phone]

## 2022-10-09 NOTE — Progress Notes (Signed)
PHARMACY - TOTAL PARENTERAL NUTRITION CONSULT NOTE   Indication: Prolonged ileus  Patient Measurements: Height: '4\' 11"'$  (149.9 cm) Weight: 96.4 kg (212 lb 8.4 oz) IBW/kg (Calculated) : 43.2 TPN AdjBW (KG): 55.2 Body mass index is 42.92 kg/m.  Assessment: Debra [name] is a 57 y.o. female s/p laparotomy, excision of greater omental mass, abdominal wall reconstruction with [name], appendectomy, and placement of Prevena vac.  Glucose / Insulin:  --No apparent history of diabetes --BG 110 - 206 --rSSI 4x/day  --SSI: 14u last 24h  17 units insulin in TPN (decreased from 20 units on 1/21)  Electrolytes: WNL Renal: SCr stable at approximate baseline  Hepatic: LFTs wnl --bilirubin wnl GI Imaging: 9/11 CTAP: no new acute issues GI Surgeries / Procedures: s/p laparotomy, excision of greater omental mass, abdominal wall reconstruction with [name], appendectomy, and placement of Prevena vac  Central access: 02/15/22 TPN start date: 02/15/22  Nutritional Goals: Goal cyclic TPN over 16 hrs: cyclic TPN over 16 hrs: (provides 102 g of protein and 1811 kcals per day) total volume over 16hrs for calculations=1400 ml (1500 ml total with overfill)  RD Assessment:  Estimated Needs Total Energy Estimated Needs: 1800-2100kcal/day Total Protein Estimated Needs: 90-110g/d Total Fluid Estimated Needs: 1.4-1.6L/day  Current Nutrition:  Soft diet + nutritional supplements, not meeting PO needs yet   Plan: Transitioned to *Cyclic* TPN on 8/46/96.   to run over 16 hours per MD request. [name] at 2000 per discussion with Dietician to allow patient time for walking in afternoon/evenings.  To run over 16 hours: -Start rate at 49 mL/hr for 1 hour. -Increase rate to 98 mL/hr for 13 hours.  -Decrease rate to 49 mL/hr for 1 hour. -Decrease rate to 25 mL/hr for 1 hour, then stop.  Plan:  See cyclic TPN rate above Continue Nutritional Components Amino acids (using 15%  Clinisol): 101 g Dextrose 19% = 266 g Lipids (using 20% SMOFlipids): 50.4 g kCal: 1817/24h  Electrolytes in TPN: Na 11mq/L, K 477m/L, Ca 6m63mL, Mg 10 mEq/L, Phos 10 mmol/L.  Continue ratio Cl: Acet 1:1  Continue MVI, trace elements, chromium and zinc per dietary recs. Continue CBG/SSI for Cyclic TPN:  -CBG 2 hrs after cyclic TPN start -CBG during middle of cyclic TPN infusion -CBG 1 hr after cylic TPN stopped -CBG while off TPN Continue resistant SSI  and continue insulin in TPN at 17 units (decreased from 20 units on 1/21) Check TPN labs on Mon/Thurs at minimum zinc 5 mg added to TPN 12/16 per dietician recommendation.  RodVallery SaharmD, BCPS 10/09/2022 7:04 AM

## 2022-10-10 LAB — GLUCOSE, CAPILLARY
Glucose-Capillary: 134 mg/dL — ABNORMAL HIGH (ref 70–99)
Glucose-Capillary: 110 mg/dL — ABNORMAL HIGH (ref 70–99)
Glucose-Capillary: 91 mg/dL (ref 70–99)
Glucose-Capillary: 131 mg/dL — ABNORMAL HIGH (ref 70–99)

## 2022-10-10 MED ORDER — ZINC CHLORIDE 1 MG/ML IV SOLN
INTRAVENOUS | Status: AC
  Filled 2022-10-10: qty 672

## 2022-10-10 NOTE — Progress Notes (Addendum)
[facility] SURGICAL PROGRESS NOTE (cpt [phone])  Hospital Day(s): 239.   Post op day(s): 237 Days Post-Op.   Interval History:  Patient seen and examined No further oozing reported  [name] changed 02/01 secondary to leaking  No new labs [name] output 950 ccs + unmeasured; managing effluent independently Advanced to FLD; still poor appetite On TPN She is ambulating better; reviewed mobility notes   Review of Systems:  Constitutional: denies fever, chills  HEENT: denies cough or congestion  Respiratory: denies any shortness of breath  Cardiovascular: denies chest pain or palpitations  Gastrointestinal: + Abdominal pain, denies N/V Genitourinary: denies burning with urination or urinary frequency Integumentary: + midline wound (stable)   Vital signs in last 24 hours: [min-max] current  Temp:  [97.8 F (36.6 C)-98.6 F (37 C)] 97.8 F (36.6 C) (02/02 0743) Pulse Rate:  [67-91] 67 (02/02 0743) Resp:  [16-20] 20 (02/02 0743) BP: (81-106)/(49-68) 98/52 (02/02 0743) SpO2:  [98 %-100 %] 100 % (02/02 0743)     Height: '4\' 11"'$  (149.9 cm) Weight: 96.4 kg BMI (Calculated): 43.04   Intake/Output last 2 shifts:  02/01 0701 - 02/02 0700 In: 941.2 [I.V.:941.2] Out: 950 [Drains:950]   Physical Exam:  Constitutional: alert, cooperative and no distress  Respiratory: breathing non-labored at rest  Cardiovascular: regular rate and sinus rhythm  Gastrointestinal: Soft, rather unchanged suprapubic tenderness (mild), non-distended, no rebound/guarding. Integumentary:  Port removal site in right chest is healing via secondary intention; no erythema. Midline wound open, peritoneum closed; granulating; there are three stomatized areas visible in the LUQ and LLQ portions of the wound, no more oozing  Musculoskeletal: Trace edema to lower extremities; no calf tenderness nor erythema. No other edema present    Labs:     Latest Ref Rng & Units 10/09/2022    5:43 AM 10/08/2022     3:00 AM 10/06/2022    8:07 AM  CBC  WBC 4.0 - 10.5 K/uL 3.9  3.3  2.9   Hemoglobin 12.0 - 15.0 g/dL 8.6  7.1  7.4   Hematocrit 36.0 - 46.0 % 26.8  22.3  23.7   Platelets 150 - 400 K/uL 121  121  118       Latest Ref Rng & Units 10/09/2022    5:43 AM 10/06/2022    8:07 AM 10/02/2022    8:36 AM  CMP  Glucose 70 - 99 mg/dL 136  146  151   BUN 6 - 20 mg/dL '23  20  21   '$ Creatinine 0.44 - 1.00 mg/dL 0.35  0.45  0.44   Sodium 135 - 145 mmol/L 136  135  134   Potassium 3.5 - 5.1 mmol/L 3.6  4.5  3.9   Chloride 98 - 111 mmol/L 107  105  106   CO2 22 - 32 mmol/L '24  24  24   '$ Calcium 8.9 - 10.3 mg/dL 8.0  8.0  7.9   Total Protein 6.5 - 8.1 g/dL 6.4  6.0  6.3   Total Bilirubin 0.3 - 1.2 mg/dL 0.5  0.3  0.6   Alkaline Phos 38 - 126 U/L 142  123  123   AST 15 - 41 U/L 33  33  26   ALT 0 - 44 U/L '26  26  19     '$ Imaging studies: No new pertinent imaging studies this AM   Assessment/Plan:  57 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   - New 02/02: No acute issues   - Wound Care ([name] Pouch); Appreciate WOC RN assistance. No longer on intermittent or continuous suction. Patient seems to like this better and has started to manage effluent independently. She will need intermittent suction with Yankauer to control effluent. Likely be beneficial to suction before ambulation.    - Will liberalize diet   - Continue daily dressing change of right chest port site; okay to cover with Band-Aid or gauze with tape  - Resumed TPN; nutritional labs pending this morning - Appreciate dietary assistance with trace elements; iron supplementation            - Monitor abdominal examination; on-going bowel function            - Pain control prn;  antemetic prn            - Progressed with therapies; no longer any recommendations  - Appreciate neurosurgery assistance with compression fracture; serial XRs   - Discharge Planning: Continue to anticipate lengthy admission +/- possible transfer; no new progress regarding disposition unfortunately   All of the above findings and recommendations were discussed with the patient, and the medical team, and all of patient's questions were answered to her expressed satisfaction.  -- Debra Simon, PA-C [facility] 10/10/2022, 9:05 AM M-F: 7am - 4pm

## 2022-10-10 NOTE — Progress Notes (Signed)
PHARMACY - TOTAL PARENTERAL NUTRITION CONSULT NOTE   Indication: Prolonged ileus  Patient Measurements: Height: '4\' 11"'$  (149.9 cm) Weight: 96.4 kg (212 lb 8.4 oz) IBW/kg (Calculated) : 43.2 TPN AdjBW (KG): 55.2 Body mass index is 42.92 kg/m.  Assessment: Debra [name] is a 57 y.o. female s/p laparotomy, excision of greater omental mass, abdominal wall reconstruction with [name], appendectomy, and placement of Prevena vac.  Glucose / Insulin:  --No apparent history of diabetes --BG 105 - 127 --rSSI 4x/day  --SSI: 3u last 24h  17 units insulin in TPN (decreased from 20 units on 1/21)  Electrolytes: WNL Renal: SCr stable at approximate baseline  Hepatic: LFTs wnl --bilirubin wnl GI Imaging: 9/11 CTAP: no new acute issues GI Surgeries / Procedures: s/p laparotomy, excision of greater omental mass, abdominal wall reconstruction with [name], appendectomy, and placement of Prevena vac  Central access: 02/15/22 TPN start date: 02/15/22  Nutritional Goals: Goal cyclic TPN over 16 hrs: cyclic TPN over 16 hrs: (provides 102 g of protein and 1811 kcals per day) total volume over 16hrs for calculations=1400 ml (1500 ml total with overfill)  RD Assessment:  Estimated Needs Total Energy Estimated Needs: 1800-2100kcal/day Total Protein Estimated Needs: 90-110g/d Total Fluid Estimated Needs: 1.4-1.6L/day  Current Nutrition:  Soft diet + nutritional supplements, not meeting PO needs yet   Plan: Transitioned to *Cyclic* TPN on 4/74/25.   to run over 16 hours per MD request. [name] at 2000 per discussion with Dietician to allow patient time for walking in afternoon/evenings.  To run over 16 hours: -Start rate at 49 mL/hr for 1 hour. -Increase rate to 98 mL/hr for 13 hours.  -Decrease rate to 49 mL/hr for 1 hour. -Decrease rate to 25 mL/hr for 1 hour, then stop.  Plan:  See cyclic TPN rate above Continue Nutritional Components Amino acids (using 15%  Clinisol): 101 g Dextrose 19% = 266 g Lipids (using 20% SMOFlipids): 50.4 g kCal: 1817/24h  Electrolytes in TPN: Na 8mq/L, K 488m/L, Ca 61m27mL, Mg 10 mEq/L, Phos 10 mmol/L.  Continue ratio Cl: Acet 1:1  Continue MVI, trace elements, chromium and zinc per dietary recs. Continue CBG/SSI for Cyclic TPN:  -CBG 2 hrs after cyclic TPN start -CBG during middle of cyclic TPN infusion -CBG 1 hr after cylic TPN stopped -CBG while off TPN Continue resistant SSI  and continue insulin in TPN at 17 units (decreased from 20 units on 1/21) Check TPN labs on Mon/Thurs at minimum zinc 5 mg added to TPN 12/16 per dietician recommendation.  RodVallery SaharmD, BCPS 10/10/2022 7:09 AM

## 2022-10-11 MED ORDER — SILVER NITRATE-POT NITRATE 75-25 % EX MISC
10.0000 | CUTANEOUS | Status: DC | PRN
  Filled 2022-10-11: qty 10

## 2022-10-11 MED ORDER — ZINC CHLORIDE 1 MG/ML IV SOLN
INTRAVENOUS | Status: AC
  Filled 2022-10-11: qty 672

## 2022-10-11 NOTE — Progress Notes (Signed)
PHARMACY - TOTAL PARENTERAL NUTRITION CONSULT NOTE   Indication: Prolonged ileus  Patient Measurements: Height: '4\' 11"'$  (149.9 cm) Weight: 96 kg (211 lb 10.3 oz) IBW/kg (Calculated) : 43.2 TPN AdjBW (KG): 55.2 Body mass index is 42.75 kg/m.  Assessment: Debra [name] is a 57 y.o. female s/p laparotomy, excision of greater omental mass, abdominal wall reconstruction with Debra [name] release, appendectomy, and placement of Prevena vac.  Glucose / Insulin:  --No apparent history of diabetes --BG 105 - 127 --rSSI 4x/day  --SSI: 3u last 24h  17 units insulin in TPN (decreased from 20 units on 1/21)  Electrolytes: WNL Renal: SCr stable at approximate baseline  Hepatic: LFTs wnl --bilirubin wnl GI Imaging: 9/11 CTAP: no new acute issues GI Surgeries / Procedures: s/p laparotomy, excision of greater omental mass, abdominal wall reconstruction with Debra [name] release, appendectomy, and placement of Prevena vac  Central access: 02/15/22 TPN start date: 02/15/22  Nutritional Goals: Goal cyclic TPN over 16 hrs: cyclic TPN over 16 hrs: (provides 102 g of protein and 1811 kcals per day) total volume over 16hrs for calculations=1400 ml (1500 ml total with overfill)  RD Assessment:  Estimated Needs Total Energy Estimated Needs: 1800-2100kcal/day Total Protein Estimated Needs: 90-110g/d Total Fluid Estimated Needs: 1.4-1.6L/day  Current Nutrition:  Soft diet + nutritional supplements, not meeting PO needs yet   Plan: Transitioned to *Cyclic* TPN on 5/44/92.   to run over 16 hours per MD request. Debra [name] at 2000 per discussion with Dietician to allow patient time for walking in afternoon/evenings.  To run over 16 hours: -Start rate at 49 mL/hr for 1 hour. -Increase rate to 98 mL/hr for 13 hours.  -Decrease rate to 49 mL/hr for 1 hour. -Decrease rate to 25 mL/hr for 1 hour, then stop.  Plan:  See cyclic TPN rate above Continue Nutritional Components Amino acids (using 15%  Clinisol): 101 g Dextrose 19% = 266 g Lipids (using 20% SMOFlipids): 50.4 g kCal: 1817/24h  Electrolytes in TPN: Na 56mq/L, K 427m/L, Ca 70m370mL, Mg 10 mEq/L, Phos 10 mmol/L.  Continue ratio Cl: Acet 1:1  Continue MVI, trace elements, chromium and zinc per dietary recs. Continue CBG/SSI for Cyclic TPN:  -CBG 2 hrs after cyclic TPN start -CBG during middle of cyclic TPN infusion -CBG 1 hr after cylic TPN stopped -CBG while off TPN Continue resistant SSI  and continue insulin in TPN at 17 units (decreased from 20 units on 1/21) Check TPN labs on Mon/Thurs at minimum zinc 5 mg added to TPN 12/16 per dietician recommendation.  Debra [name] Clinical Pharmacist 10/11/2022 8:52 AM

## 2022-10-11 NOTE — Progress Notes (Signed)
10/11/2022  Subjective: No acute events overnight.  This morning there was oozing from two sites around two of the stomas, and also one brisker site in the superior midline.  Patient denies any worsening pain or nausea.  Vital signs: Temp:  [98 F (36.7 C)-98.4 F (36.9 C)] 98.4 F (36.9 C) (02/03 0808) Pulse Rate:  [75-83] 81 (02/03 0808) Resp:  [16-20] 18 (02/03 0808) BP: (97-120)/(51-68) 106/53 (02/03 0808) SpO2:  [98 %-100 %] 98 % (02/03 0808) Weight:  [96 kg] 96 kg (02/03 0403)   Intake/Output: 02/02 0701 - 02/03 0700 In: 120.4 [P.O.:120; I.V.:0.4] Out: 650 [Drains:650] Last BM Date : 10/09/22  Physical Exam: Constitutional: No acute distress Abdomen:  soft, non-distended, currently non-tender.  Patient's midline wound has three stable stomas, more skin growing on the right upper quadrant of the wound bed, and some bridging between the two superior stomas.  There was oozing at three sites which was controlled with silver nitrate sticks and manual pressure.    Labs:  Recent Labs    10/09/22 0543  WBC 3.9*  HGB 8.6*  HCT 26.8*  PLT 121*   Recent Labs    10/09/22 0543  NA 136  K 3.6  CL 107  CO2 24  GLUCOSE 136*  BUN 23*  CREATININE 0.35*  CALCIUM 8.0*   No results for input(s): "LABPROT", "INR" in the last 72 hours.  Imaging: No results found.  Assessment/Plan: This is a 57 y.o. female with enterocutaneous fistulas  --Bleeding from three sites controlled today with silver nitrate sticks.  Will continue to monitor and treat any bleeding as needed.   --Continue soft diet and cyclic TPN for nutritional support --Continue pain control as needed --Ambulate as tolerated   I spent 35 minutes dedicated to the care of this patient on the date of this encounter to include pre-visit review of records, face-to-face time with the patient discussing diagnosis and management, and any post-visit coordination of care.  Melvyn Neth, [facility]

## 2022-10-12 LAB — GLUCOSE, CAPILLARY
Glucose-Capillary: 112 mg/dL — ABNORMAL HIGH (ref 70–99)
Glucose-Capillary: 110 mg/dL — ABNORMAL HIGH (ref 70–99)

## 2022-10-12 MED ORDER — ZINC CHLORIDE 1 MG/ML IV SOLN
INTRAVENOUS | Status: AC
  Filled 2022-10-12: qty 672

## 2022-10-12 NOTE — Progress Notes (Signed)
10/12/2022  Subjective: No acute events overnight.  Yesterday the patient had some oozing from her wound bed in three separate areas, which were all treated with silver nitrate.  No further issues yesterday evening or overnight.  Denies any worsening pain.  Vital signs: Temp:  [98 F (36.7 C)-98.1 F (36.7 C)] 98 F (36.7 C) (02/04 0822) Pulse Rate:  [72-78] 73 (02/04 0822) Resp:  [16-18] 16 (02/04 0822) BP: (90-107)/(49-68) 101/55 (02/04 0822) SpO2:  [100 %] 100 % (02/04 0822)   Intake/Output: 02/03 0701 - 02/04 0700 In: 569 [I.V.:569] Out: 700 [Drains:700] Last BM Date : 10/11/22  Physical Exam: Constitutional: No acute distress Abdomen:  soft, non-distended, currently non-tender.  Patient's midline wound has three stable stomas, more skin growing on the right upper quadrant of the wound bed, and some bridging between the two superior stomas.  No bleeding noted today.    Labs:  No results for input(s): "WBC", "HGB", "HCT", "PLT" in the last 72 hours.  No results for input(s): "NA", "K", "CL", "CO2", "GLUCOSE", "BUN", "CREATININE", "CALCIUM" in the last 72 hours.  Invalid input(s): "MAGNESIUM"  No results for input(s): "LABPROT", "INR" in the last 72 hours.  Imaging: No results found.  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  Discussed with the patient that it is ok to be out of bed and trying to ambulate.  If oozing recurs, will address again with silver nitrate. --Continue soft diet and cyclic TPN for nutritional support --Continue pain control as needed --Ambulate as tolerated   I spent 35 minutes dedicated to the care of this patient on the date of this encounter to include pre-visit review of records, face-to-face time with the patient discussing diagnosis and management, and any post-visit coordination of care.  Melvyn Neth, [facility]

## 2022-10-12 NOTE — Progress Notes (Signed)
Pt [name] pouch is leaking; dressing reinforced.

## 2022-10-12 NOTE — Progress Notes (Signed)
PHARMACY - TOTAL PARENTERAL NUTRITION CONSULT NOTE   Indication: Prolonged ileus  Patient Measurements: Height: '4\' 11"'$  (149.9 cm) Weight: 96 kg (211 lb 10.3 oz) IBW/kg (Calculated) : 43.2 TPN AdjBW (KG): 55.2 Body mass index is 42.75 kg/m.  Assessment: Debra [name] is a 57 y.o. female s/p laparotomy, excision of greater omental mass, abdominal wall reconstruction with [name], appendectomy, and placement of Prevena vac.  Glucose / Insulin:  --No apparent history of diabetes --BG 91 - 134 --rSSI 4x/day(patient currently refusing) --SSI: 4u last 24h  17 units insulin in TPN (decreased from 20 units on 1/21)  Electrolytes: WNL Renal: SCr stable at approximate baseline  Hepatic: LFTs wnl --bilirubin wnl GI Imaging: 9/11 CTAP: no new acute issues GI Surgeries / Procedures: s/p laparotomy, excision of greater omental mass, abdominal wall reconstruction with [name], appendectomy, and placement of Prevena vac  Central access: 02/15/22 TPN start date: 02/15/22  Nutritional Goals: Goal cyclic TPN over 16 hrs: cyclic TPN over 16 hrs: (provides 102 g of protein and 1811 kcals per day) total volume over 16hrs for calculations=1400 ml (1500 ml total with overfill)  RD Assessment:  Estimated Needs Total Energy Estimated Needs: 1800-2100kcal/day Total Protein Estimated Needs: 90-110g/d Total Fluid Estimated Needs: 1.4-1.6L/day  Current Nutrition:  Soft diet + nutritional supplements, not meeting PO needs yet   Plan: Transitioned to *Cyclic* TPN on 02/03/40.   to run over 16 hours per MD request. [name] at 2000 per discussion with Dietician to allow patient time for walking in afternoon/evenings.  To run over 16 hours: -Start rate at 49 mL/hr for 1 hour. -Increase rate to 98 mL/hr for 13 hours.  -Decrease rate to 49 mL/hr for 1 hour. -Decrease rate to 25 mL/hr for 1 hour, then stop.  Plan:  See cyclic TPN rate above Continue Nutritional  Components Amino acids (using 15% Clinisol): 101 g Dextrose 19% = 266 g Lipids (using 20% SMOFlipids): 50.4 g kCal: 1817/24h  Electrolytes in TPN: Na 43mq/L, K 473m/L, Ca 25m54mL, Mg 10 mEq/L, Phos 10 mmol/L.  Continue ratio Cl: Acet 1:1  Continue MVI, trace elements, chromium and zinc per dietary recs. Continue CBG/SSI for Cyclic TPN:  -CBG 2 hrs after cyclic TPN start -CBG during middle of cyclic TPN infusion -CBG 1 hr after cylic TPN stopped -CBG while off TPN Continue resistant SSI  and continue insulin in TPN at 17 units (decreased from 20 units on 1/21) Check TPN labs on Mon/Thurs at minimum zinc 5 mg added to TPN 12/16 per dietician recommendation.  [name] Clinical Pharmacist 10/12/2022 8:18 AM

## 2022-10-13 LAB — COMPREHENSIVE METABOLIC PANEL
ALT: 23 U/L (ref 0–44)
AST: 27 U/L (ref 15–41)
Albumin: 2.5 g/dL — ABNORMAL LOW (ref 3.5–5.0)
Alkaline Phosphatase: 136 U/L — ABNORMAL HIGH (ref 38–126)
Anion gap: 6 (ref 5–15)
BUN: 22 mg/dL — ABNORMAL HIGH (ref 6–20)
CO2: 23 mmol/L (ref 22–32)
Calcium: 8.3 mg/dL — ABNORMAL LOW (ref 8.9–10.3)
Chloride: 107 mmol/L (ref 98–111)
Creatinine, Ser: 0.45 mg/dL (ref 0.44–1.00)
GFR, Estimated: >60 mL/min (ref >60)
Glucose, Bld: 115 mg/dL — ABNORMAL HIGH (ref 70–99)
Potassium: 4.6 mmol/L (ref 3.5–5.1)
Sodium: 136 mmol/L (ref 135–145)
Total Bilirubin: 0.5 mg/dL (ref 0.3–1.2)
Total Protein: 6.3 g/dL — ABNORMAL LOW (ref 6.5–8.1)

## 2022-10-13 LAB — GLUCOSE, CAPILLARY
Glucose-Capillary: 166 mg/dL — ABNORMAL HIGH (ref 70–99)
Glucose-Capillary: 97 mg/dL (ref 70–99)
Glucose-Capillary: 98 mg/dL (ref 70–99)
Glucose-Capillary: 97 mg/dL (ref 70–99)
Glucose-Capillary: 163 mg/dL — ABNORMAL HIGH (ref 70–99)
Glucose-Capillary: 133 mg/dL — ABNORMAL HIGH (ref 70–99)
Glucose-Capillary: 165 mg/dL — ABNORMAL HIGH (ref 70–99)
Glucose-Capillary: 115 mg/dL — ABNORMAL HIGH (ref 70–99)
Glucose-Capillary: 133 mg/dL — ABNORMAL HIGH (ref 70–99)

## 2022-10-13 LAB — MAGNESIUM: Magnesium: 2 mg/dL (ref 1.7–2.4)

## 2022-10-13 LAB — PHOSPHORUS: Phosphorus: 3.3 mg/dL (ref 2.5–4.6)

## 2022-10-13 LAB — TRIGLYCERIDES: Triglycerides: 90 mg/dL (ref <150)

## 2022-10-13 MED ORDER — ZINC CHLORIDE 1 MG/ML IV SOLN
INTRAVENOUS | Status: AC
  Filled 2022-10-13: qty 672

## 2022-10-13 NOTE — Plan of Care (Signed)
  Problem: Nutrition: Goal: Adequate nutrition will be maintained Outcome: Progressing   Problem: Activity: Goal: Risk for activity intolerance will decrease Outcome: Progressing   Problem: Coping: Goal: Level of anxiety will decrease Outcome: Progressing   Problem: Pain Managment: Goal: General experience of comfort will improve Outcome: Progressing

## 2022-10-13 NOTE — Progress Notes (Signed)
PHARMACY - TOTAL PARENTERAL NUTRITION CONSULT NOTE   Indication: Prolonged ileus  Patient Measurements: Height: '4\' 11"'$  (149.9 cm) Weight: 95.2 kg (209 lb 14.1 oz) IBW/kg (Calculated) : 43.2 TPN AdjBW (KG): 55.2 Body mass index is 42.39 kg/m.  Assessment: Debra [name] is a 57 y.o. female s/p laparotomy, excision of greater omental mass, abdominal wall reconstruction with [name], appendectomy, and placement of Prevena vac.  Glucose / Insulin:  --No apparent history of diabetes --BG 110-165 --rSSI 4x/day(patient currently refusing) --SSI: 7u last 24h  17 units insulin in TPN (decreased from 20 units on 1/21)  Electrolytes: WNL Renal: SCr stable at approximate baseline  Hepatic: LFTs wnl --bilirubin wnl GI Imaging: 9/11 CTAP: no new acute issues GI Surgeries / Procedures: s/p laparotomy, excision of greater omental mass, abdominal wall reconstruction with [name], appendectomy, and placement of Prevena vac  Central access: 02/15/22 TPN start date: 02/15/22  Nutritional Goals: Goal cyclic TPN over 16 hrs: cyclic TPN over 16 hrs: (provides 102 g of protein and 1811 kcals per day) total volume over 16hrs for calculations=1400 ml (1500 ml total with overfill)  RD Assessment:  Estimated Needs Total Energy Estimated Needs: 1800-2100kcal/day Total Protein Estimated Needs: 90-110g/d Total Fluid Estimated Needs: 1.4-1.6L/day  Current Nutrition:  Soft diet + nutritional supplements, not meeting PO needs yet   Plan: Transitioned to *Cyclic* TPN on 06/01/45.   to run over 16 hours per MD request. [name] at 2000 per discussion with Dietician to allow patient time for walking in afternoon/evenings.  To run over 16 hours: -Start rate at 49 mL/hr for 1 hour. -Increase rate to 98 mL/hr for 13 hours.  -Decrease rate to 49 mL/hr for 1 hour. -Decrease rate to 25 mL/hr for 1 hour, then stop.  Plan:  See cyclic TPN rate above Continue Nutritional  Components Amino acids (using 15% Clinisol): 101 g Dextrose 19% = 266 g Lipids (using 20% SMOFlipids): 50.4 g kCal: 1817/24h  Electrolytes in TPN: Na 47mq/L, K 430m/L, Ca 40m4mL, Mg 10 mEq/L, Phos 10 mmol/L.  Continue ratio Cl: Acet 1:1  Continue MVI, trace elements, chromium and zinc per dietary recs. Continue CBG/SSI for Cyclic TPN:  -CBG 2 hrs after cyclic TPN start -CBG during middle of cyclic TPN infusion -CBG 1 hr after cylic TPN stopped -CBG while off TPN Continue resistant SSI  and continue insulin in TPN at 17 units (decreased from 20 units on 1/21) Check TPN labs on Mon/Thurs at minimum zinc 5 mg added to TPN 12/16 per dietician recommendation.  HowLorin PicketharmD Clinical Pharmacist 10/13/2022 10:22 AM

## 2022-10-13 NOTE — Progress Notes (Signed)
[facility] SURGICAL PROGRESS NOTE (cpt [phone])  Hospital Day(s): 242.   Post op day(s): 240 Days Post-Op.   Interval History:  Patient seen and examined Oozing over the weekend again; silver nitrate applied; unfortunately continues to have issues with oozing [name] changed 02/04 secondary to leaking; been having more issues as of late Nutritional labs pending this morning  [name] output 600 ccs + unmeasured; managing effluent independently On TPN; On Soft diet She is ambulating better; reviewed mobility notes   Review of Systems:  Constitutional: denies fever, chills  HEENT: denies cough or congestion  Respiratory: denies any shortness of breath  Cardiovascular: denies chest pain or palpitations  Gastrointestinal: + Abdominal pain, denies N/V Genitourinary: denies burning with urination or urinary frequency Integumentary: + midline wound (stable)   Vital signs in last 24 hours: [min-max] current  Temp:  [98 F (36.7 C)-98.4 F (36.9 C)] 98.4 F (36.9 C) (02/05 0444) Pulse Rate:  [73-88] 88 (02/05 0444) Resp:  [16-18] 18 (02/05 0444) BP: (91-101)/(49-55) 96/54 (02/05 0444) SpO2:  [100 %] 100 % (02/05 0444) Weight:  [95.2 kg] 95.2 kg (02/05 0535)     Height: '4\' 11"'$  (149.9 cm) Weight: 95.2 kg BMI (Calculated): 43.04   Intake/Output last 2 shifts:  02/04 0701 - 02/05 0700 In: 913.2 [I.V.:913.2] Out: 600 [Drains:600]   Physical Exam:  Constitutional: alert, cooperative and no distress  Respiratory: breathing non-labored at rest  Cardiovascular: regular rate and sinus rhythm  Gastrointestinal: Soft, rather unchanged suprapubic tenderness (mild), non-distended, no rebound/guarding. Integumentary:  Port removal site in right chest is healing via secondary intention; no erythema. Midline wound open, peritoneum closed; granulating; there are three stomatized areas visible in the LUQ and LLQ portions of the wound, clot suctioned Musculoskeletal: Trace edema to  lower extremities; no calf tenderness nor erythema. No other edema present    Labs:     Latest Ref Rng & Units 10/09/2022    5:43 AM 10/08/2022    3:00 AM 10/06/2022    8:07 AM  CBC  WBC 4.0 - 10.5 K/uL 3.9  3.3  2.9   Hemoglobin 12.0 - 15.0 g/dL 8.6  7.1  7.4   Hematocrit 36.0 - 46.0 % 26.8  22.3  23.7   Platelets 150 - 400 K/uL 121  121  118       Latest Ref Rng & Units 10/09/2022    5:43 AM 10/06/2022    8:07 AM 10/02/2022    8:36 AM  CMP  Glucose 70 - 99 mg/dL 136  146  151   BUN 6 - 20 mg/dL '23  20  21   '$ Creatinine 0.44 - 1.00 mg/dL 0.35  0.45  0.44   Sodium 135 - 145 mmol/L 136  135  134   Potassium 3.5 - 5.1 mmol/L 3.6  4.5  3.9   Chloride 98 - 111 mmol/L 107  105  106   CO2 22 - 32 mmol/L '24  24  24   '$ Calcium 8.9 - 10.3 mg/dL 8.0  8.0  7.9   Total Protein 6.5 - 8.1 g/dL 6.4  6.0  6.3   Total Bilirubin 0.3 - 1.2 mg/dL 0.5  0.3  0.6   Alkaline Phos 38 - 126 U/L 142  123  123   AST 15 - 41 U/L 33  33  26   ALT 0 - 44 U/L '26  26  19     '$ Imaging studies: No new pertinent imaging studies this AM  Assessment/Plan:  57 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   - New 02/05: Despite multiple efforts last week and over the weekend she continues to ooze from wound bed; seems to be from inferior aspect. Clot suctioned this AM. No obvious source right now; will monitor. Also having issues leaking over the weekend again; changed 02/04  - Wound Care ([name] Pouch); Appreciate WOC RN assistance. No longer on intermittent or continuous suction. Patient seems to like this better and has started to manage effluent independently. She will need intermittent suction with Yankauer to control effluent. Likely be beneficial to  suction before ambulation.    - Will liberalize diet   - Continue daily dressing change of right chest port site; okay to cover with Band-Aid or gauze with tape  - Resumed TPN; nutritional labs pending this morning - Appreciate dietary assistance with trace elements; iron supplementation            - Monitor abdominal examination; on-going bowel function            - Pain control prn; antemetic prn            - Progressed with therapies; no longer any recommendations  - Appreciate neurosurgery assistance with compression fracture; serial XRs   - Discharge Planning: Continue to anticipate lengthy admission +/- possible transfer; no new progress regarding disposition unfortunately   All of the above findings and recommendations were discussed with the patient, and the medical team, and all of patient's questions were answered to her expressed satisfaction.  -- [name], PA-C [facility] 10/13/2022, 7:35 AM M-F: 7am - 4pm

## 2022-10-13 NOTE — TOC Progression Note (Signed)
Transition of Care [facility]) - Progression Note    Patient Details  Name: Debra [name] MRN: [id number] Date of Birth: February 10, 1966  Transition of Care [facility]) CM/SW Contact  Beverly Sessions, RN Phone Number: 10/13/2022, 12:19 PM  Clinical Narrative:     Per surgery continues to ooze from wound bed  No update as far as disposition   Expected Discharge Plan: [place] Barriers to Discharge: Continued Medical Work up  Expected Discharge Plan and Services                                               Social Determinants of Health (SDOH) Interventions SDOH Screenings   Food Insecurity: No Food Insecurity (05/20/2022)  Housing: Low Risk  (05/20/2022)  Transportation Needs: No Transportation Needs (05/20/2022)  Utilities: Not At Risk (05/20/2022)  Tobacco Use: Low Risk  (09/12/2022)    Readmission Risk Interventions     No data to display

## 2022-10-13 NOTE — Consult Note (Signed)
[place] Nurse wound follow up: Large [name] pouch with window changed by bedside nurse on 2/4 related to leaking.  Current pouch is intact with good seal. Pattern in the room; abd full thickness wound with fistulas; mod amt blood-tinged liquid in the pouch.  Patient is performing intermittent suction by inserting yankour periodically. Extra pouch available at the bedside and instructions have been provided if bedside nurses need to change the pouch PRN if leaking.   Thank-you,  Julien Girt MSN, [place], [place], [place], [place]

## 2022-10-14 LAB — GLUCOSE, CAPILLARY
Glucose-Capillary: 158 mg/dL — ABNORMAL HIGH (ref 70–99)
Glucose-Capillary: 174 mg/dL — ABNORMAL HIGH (ref 70–99)
Glucose-Capillary: 102 mg/dL — ABNORMAL HIGH (ref 70–99)
Glucose-Capillary: 100 mg/dL — ABNORMAL HIGH (ref 70–99)
Glucose-Capillary: 97 mg/dL (ref 70–99)

## 2022-10-14 LAB — CBC
HCT: 24.1 % — ABNORMAL LOW (ref 36.0–46.0)
Hemoglobin: 7.5 g/dL — ABNORMAL LOW (ref 12.0–15.0)
MCH: 27.2 pg (ref 26.0–34.0)
MCHC: 31.1 g/dL (ref 30.0–36.0)
MCV: 87.3 fL (ref 80.0–100.0)
Platelets: 123 10*3/uL — ABNORMAL LOW (ref 150–400)
RBC: 2.76 MIL/uL — ABNORMAL LOW (ref 3.87–5.11)
RDW: 16.8 % — ABNORMAL HIGH (ref 11.5–15.5)
WBC: 4 10*3/uL (ref 4.0–10.5)
nRBC: 0 % (ref 0.0–0.2)

## 2022-10-14 MED ORDER — ZINC CHLORIDE 1 MG/ML IV SOLN
INTRAVENOUS | Status: AC
  Filled 2022-10-14: qty 672

## 2022-10-14 NOTE — Progress Notes (Signed)
PHARMACY - TOTAL PARENTERAL NUTRITION CONSULT NOTE   Indication: Prolonged ileus  Patient Measurements: Height: '4\' 11"'$  (149.9 cm) Weight: 96.7 kg (213 lb 3 oz) IBW/kg (Calculated) : 43.2 TPN AdjBW (KG): 55.2 Body mass index is 43.06 kg/m.  Assessment: Debra [name] is a 57 y.o. female s/p laparotomy, excision of greater omental mass, abdominal wall reconstruction with [name], appendectomy, and placement of Prevena vac.  Glucose / Insulin:  --No apparent history of diabetes --BG 115-158 --rSSI 4x/day(patient currently refusing) --SSI: 7u last 24h  17 units insulin in TPN (decreased from 20 units on 1/21)  Electrolytes: WNL Renal: SCr stable at approximate baseline  Hepatic: LFTs wnl --bilirubin wnl GI Imaging: 9/11 CTAP: no new acute issues GI Surgeries / Procedures: s/p laparotomy, excision of greater omental mass, abdominal wall reconstruction with [name], appendectomy, and placement of Prevena vac  Central access: 02/15/22 TPN start date: 02/15/22  Nutritional Goals: Goal cyclic TPN over 16 hrs: cyclic TPN over 16 hrs: (provides 102 g of protein and 1811 kcals per day) total volume over 16hrs for calculations=1400 ml (1500 ml total with overfill)  RD Assessment:  Estimated Needs Total Energy Estimated Needs: 1800-2100kcal/day Total Protein Estimated Needs: 90-110g/d Total Fluid Estimated Needs: 1.4-1.6L/day  Current Nutrition:  Soft diet + nutritional supplements, not meeting PO needs yet   Plan: Transitioned to *Cyclic* TPN on 3/66/44.   to run over 16 hours per MD request. [name] at 2000 per discussion with Dietician to allow patient time for walking in afternoon/evenings.  To run over 16 hours: -Start rate at 49 mL/hr for 1 hour. -Increase rate to 98 mL/hr for 13 hours.  -Decrease rate to 49 mL/hr for 1 hour. -Decrease rate to 25 mL/hr for 1 hour, then stop.  Plan:  See cyclic TPN rate above Continue Nutritional  Components Amino acids (using 15% Clinisol): 101 g Dextrose 19% = 266 g Lipids (using 20% SMOFlipids): 50.4 g kCal: 1817/24h  Electrolytes in TPN: Na 59mq/L, K 466m/L, Ca 69m48mL, Mg 10 mEq/L, Phos 10 mmol/L.  Continue ratio Cl: Acet 1:1  Continue MVI, trace elements, chromium and zinc per dietary recs. Continue CBG/SSI for Cyclic TPN:  -CBG 2 hrs after cyclic TPN start -CBG during middle of cyclic TPN infusion -CBG 1 hr after cylic TPN stopped -CBG while off TPN Continue resistant SSI  and continue insulin in TPN at 17 units (decreased from 20 units on 1/21) Check TPN labs on Mon/Thurs at minimum zinc 5 mg added to TPN 12/16 per dietician recommendation.  HowLorin PicketharmD Clinical Pharmacist 10/14/2022 9:03 AM

## 2022-10-14 NOTE — Progress Notes (Addendum)
[facility] SURGICAL PROGRESS NOTE (cpt [phone])  Hospital Day(s): 243.   Post op day(s): 241 Days Post-Op.   Interval History:  Patient seen and examined Still with small amount of oozing; thin effluent [name] changed 02/04 secondary to leaking; been having more issues as of late Nutritional labs pending this morning  [name] output 450 ccs + unmeasured; managing effluent independently On TPN; On Soft diet She is ambulating better; reviewed mobility notes   Review of Systems:  Constitutional: denies fever, chills  HEENT: denies cough or congestion  Respiratory: denies any shortness of breath  Cardiovascular: denies chest pain or palpitations  Gastrointestinal: + Abdominal pain, denies N/V Genitourinary: denies burning with urination or urinary frequency Integumentary: + midline wound (stable)   Vital signs in last 24 hours: [min-max] current  Temp:  [97.8 F (36.6 C)-98.2 F (36.8 C)] 98 F (36.7 C) (02/06 0510) Pulse Rate:  [75-95] 75 (02/06 0510) Resp:  [16-19] 19 (02/06 0510) BP: (86-101)/(46-55) 101/55 (02/06 0510) SpO2:  [100 %] 100 % (02/06 0510) Weight:  [96.7 kg] 96.7 kg (02/06 0533)     Height: '4\' 11"'$  (149.9 cm) Weight: 96.7 kg BMI (Calculated): 43.04   Intake/Output last 2 shifts:  02/05 0701 - 02/06 0700 In: 1050.4 [P.O.:120; I.V.:930.4] Out: 450 [Drains:450]   Physical Exam:  Constitutional: alert, cooperative and no distress  Respiratory: breathing non-labored at rest  Cardiovascular: regular rate and sinus rhythm  Gastrointestinal: Soft, rather unchanged suprapubic tenderness (mild), non-distended, no rebound/guarding. Integumentary:  Port removal site in right chest is healing via secondary intention; no erythema. Midline wound open, peritoneum closed; granulating; there are three stomatized areas visible in the LUQ and LLQ portions of the wound Musculoskeletal: Trace edema to lower extremities; no calf tenderness nor erythema. No other  edema present    Labs:     Latest Ref Rng & Units 10/09/2022    5:43 AM 10/08/2022    3:00 AM 10/06/2022    8:07 AM  CBC  WBC 4.0 - 10.5 K/uL 3.9  3.3  2.9   Hemoglobin 12.0 - 15.0 g/dL 8.6  7.1  7.4   Hematocrit 36.0 - 46.0 % 26.8  22.3  23.7   Platelets 150 - 400 K/uL 121  121  118       Latest Ref Rng & Units 10/13/2022    9:20 AM 10/09/2022    5:43 AM 10/06/2022    8:07 AM  CMP  Glucose 70 - 99 mg/dL 115  136  146   BUN 6 - 20 mg/dL '22  23  20   '$ Creatinine 0.44 - 1.00 mg/dL 0.45  0.35  0.45   Sodium 135 - 145 mmol/L 136  136  135   Potassium 3.5 - 5.1 mmol/L 4.6  3.6  4.5   Chloride 98 - 111 mmol/L 107  107  105   CO2 22 - 32 mmol/L '23  24  24   '$ Calcium 8.9 - 10.3 mg/dL 8.3  8.0  8.0   Total Protein 6.5 - 8.1 g/dL 6.3  6.4  6.0   Total Bilirubin 0.3 - 1.2 mg/dL 0.5  0.5  0.3   Alkaline Phos 38 - 126 U/L 136  142  123   AST 15 - 41 U/L 27  33  33   ALT 0 - 44 U/L '23  26  26     '$ Imaging studies: No new pertinent imaging studies this AM   Assessment/Plan:  57 y.o. female with high output  enterocutaneous fistula 241 Days Post-Op s/p re-opening of laparotomy for repair of small bowel perforation following initial laparotomy, excision of greater omental mass, abdominal wall reconstruction with [name], appendectomy, and placement of Prevena vac on 06/08.   - New 02/06: Monitor oozing; no leaking. Will recheck CBC later this week; or sooner if needed.   - Wound Care ([name] Pouch); Appreciate WOC RN assistance. No longer on intermittent or continuous suction. Patient seems to like this better and has started to manage effluent independently. She will need intermittent suction with Yankauer to control effluent. Likely be beneficial to suction before ambulation.    - Continue soft diet as tolerated  - Continue daily dressing change of right chest port site; okay to cover with Band-Aid or gauze with tape  - Resumed TPN; nutritional labs pending this morning -  Appreciate dietary assistance with trace elements; iron supplementation            - Monitor abdominal examination; on-going bowel function            - Pain control prn; antemetic prn            - Progressed with therapies; no longer any recommendations  - Appreciate neurosurgery assistance with compression fracture; serial XRs   - Discharge Planning: Continue to anticipate lengthy admission +/- possible transfer; no new progress regarding disposition unfortunately   All of the above findings and recommendations were discussed with the patient, and the medical team, and all of patient's questions were answered to her expressed satisfaction.  -- [name], PA-C [facility] 10/14/2022, 7:39 AM M-F: 7am - 4pm

## 2022-10-14 NOTE — Progress Notes (Signed)
Nutrition Follow-up  DOCUMENTATION CODES:   Obesity unspecified  INTERVENTION:   Continue cyclic TPN per pharmacy (16 hrs)- provides 1811kcal/day and 102g/day protein   Ensure Enlive po BID, each supplement provides 350 kcal and 20 grams of protein.  MVI, chromium and trace elements in TPN (hold 2/7 for vitamin labs 2/8)   Triglycerides checked monthly   Daily weights   Vitamin D2- 50,000 units po weekly   Zinc '2mg'$  daily added to TPN (hold 2/7)  Will recheck routine vitamin labs 2/8   NUTRITION DIAGNOSIS:   Increased nutrient needs related to wound healing, catabolic illness as evidenced by estimated needs.  GOAL:   Patient will meet greater than or equal to 90% of their needs -met with TPN   MONITOR:   PO intake, Supplement acceptance, Labs, Weight trends, Diet advancement, I & O's, TPN  ASSESSMENT:   57 y/o female with h/o hypothyroidism, COVID 19 (3/21), kidney stones and stage 3 colon cancer (s/p left hemicolectomy 5/21 and chemotherapy) who is admitted for new pelvic mass now s/p laparotomy 6/8 (with excision of pelvic mass from greater omentum, abdominal wall reconstruction with bilateral myocutaneous flaps and mesh, incisional hernia repair, appendectomy repair and VAC placement) complicated by bowel perforation s/p reopening of recent laparotomy 6/10 (with repair of small bowel perforation, excision of mesh, placement of two phasix mesh and VAC placement). Pathology returned as metastatic adenocarcinoma. Pt with L1 compression fracture.   Pt continues to tolerate TPN well at goal rate; TPN is currently being cycled for 16 hrs. Triglycerides wnl and are being checked monthly. Hyperglycemia improved with insulin in TPN. Volume status improved; pt up ~16lbs since admission. Will check routine vitamin labs every two months as pt on chronic TPN (next due 2/8). Pt continues to have poor oral intake; pt eating <25% of meals. Pt on soft diet. [name] pouch with 451m output. No  discharge plan in place.   Medications reviewed and include: D2, insulin, synthroid, protonix, TPN  -Selenium 93(L), Chromium 2.3(H), copper 118 wnl, Manganese 18.6(H) - 12/7 -zinc 95 wnl, vitamin D 49.04 wnl, Iodine 450.1(H)- 1/9 -Iron 32.0 wnl, TIBC 330, ferritin 54 wnl, folate 14.4 wnl, B12 769- 12/7 -Vitamin B1- 91.2, vitamin A- 21.5, B6- 22.8 wnl, vitamin C- 0.5, vitamin E- 12.7 wnl, vitamin K 0.82- 12/7  -urinary iodine- 3257ug/L(H)  Labs reviewed: Na 136 wnl, K 4.6 wnl, P 3.3 wnl, Mg 2.0wnl- 2/5 Triglycerides- 90- 2/5 Hgb 7.5(L), Hct 24.1(L) Cbgs- 102, 174, 158 x 24 hrs  Diet Order:    Diet Order             DIET SOFT Room service appropriate? Yes; Fluid consistency: Thin  Diet effective now                  EDUCATION NEEDS:   Not appropriate for education at this time  Skin:  Skin Assessment: Reviewed RN Assessment ( Midline wound: 11cm x 12cm )  Last BM:  2/5- type 7  Height:   Ht Readings from Last 1 Encounters:  03/01/22 '4\' 11"'$  (1.499 m)    Weight:   Wt Readings from Last 1 Encounters:  10/14/22 96.7 kg    Ideal Body Weight:  44.3 kg  BMI:  Body mass index is 43.06 kg/m.  Estimated Nutritional Needs:   Kcal:  1800-2100kcal/day  Protein:  90-110g/d  Fluid:  1.4-1.6L/day  CKoleen DistanceMS, RD, LDN Please refer to APerry Hospitalfor RD and/or RD on-call/weekend/after hours pager

## 2022-10-14 NOTE — Plan of Care (Signed)
  Problem: Nutrition: Goal: Adequate nutrition will be maintained Outcome: Progressing   Problem: Activity: Goal: Risk for activity intolerance will decrease Outcome: Progressing   Problem: Nutrition: Goal: Adequate nutrition will be maintained Outcome: Progressing

## 2022-10-15 LAB — GLUCOSE, CAPILLARY
Glucose-Capillary: 156 mg/dL — ABNORMAL HIGH (ref 70–99)
Glucose-Capillary: 112 mg/dL — ABNORMAL HIGH (ref 70–99)
Glucose-Capillary: 97 mg/dL (ref 70–99)

## 2022-10-15 MED ORDER — STERILE WATER FOR INJECTION IV SOLN
INTRAVENOUS | Status: AC
  Filled 2022-10-15: qty 672

## 2022-10-15 NOTE — Plan of Care (Signed)
  Problem: Nutrition: Goal: Adequate nutrition will be maintained Outcome: Progressing   

## 2022-10-15 NOTE — Progress Notes (Signed)
Patient seen and examined. Had a good couple of days.  No further bleeding. Tolerating some p.o. Ambulated yesterday Pain is much better. Hemoglobin in the 7 range. Continue current care with [name] pouch, TPN.  Check hemoglobin tomorrow

## 2022-10-15 NOTE — Consult Note (Signed)
[place] Nurse wound follow up: Large [name] pouch with window changed by bedside nurse on 2/6 related to leaking.  Current pouch is intact with good seal. Pattern in the room; abd full thickness wound with fistulas; mod amt blood-tinged liquid in the pouch.  Patient is performing intermittent suction by inserting yankour periodically. Extra pouch available at the bedside and instructions have been provided if bedside nurses need to change the pouch PRN if leaking.   Thank-you,  Julien Girt MSN, [place], [place], [place], [place]

## 2022-10-15 NOTE — Progress Notes (Signed)
Mobility Specialist - Progress Note   10/15/22 1445  Mobility  Activity Ambulated independently in hallway  Level of Assistance Modified independent, requires aide device or extra time  Assistive Device Front wheel walker  Distance Ambulated (ft) 320 ft  RUE Weight Bearing WBAT  $Mobility charge 1 Mobility   Pt amb two laps around NS ModI with RW. Pt returned to room, left amb with needs within reach.   Candie Mile Mobility Specialist 10/15/22 2:49 PM

## 2022-10-15 NOTE — Progress Notes (Signed)
PHARMACY - TOTAL PARENTERAL NUTRITION CONSULT NOTE   Indication: Prolonged ileus  Patient Measurements: Height: '4\' 11"'$  (149.9 cm) Weight: 96.7 kg (213 lb 3 oz) IBW/kg (Calculated) : 43.2 TPN AdjBW (KG): 55.2 Body mass index is 43.06 kg/m.  Assessment: Debra [name] is a 57 y.o. female s/p laparotomy, excision of greater omental mass, abdominal wall reconstruction with [name], appendectomy, and placement of Prevena vac.  Glucose / Insulin:  --No apparent history of diabetes --BG 97-174 --rSSI 4x/day(patient currently refusing) --SSI: 4u last 24h  17 units insulin in TPN (decreased from 20 units on 1/21)  Electrolytes: WNL Renal: SCr stable at approximate baseline  Hepatic: LFTs wnl --bilirubin wnl GI Imaging: 9/11 CTAP: no new acute issues GI Surgeries / Procedures: s/p laparotomy, excision of greater omental mass, abdominal wall reconstruction with [name], appendectomy, and placement of Prevena vac  Central access: 02/15/22 TPN start date: 02/15/22  Nutritional Goals: Goal cyclic TPN over 16 hrs: cyclic TPN over 16 hrs: (provides 102 g of protein and 1811 kcals per day) total volume over 16hrs for calculations=1400 ml (1500 ml total with overfill)  RD Assessment:  Estimated Needs Total Energy Estimated Needs: 1800-2100kcal/day Total Protein Estimated Needs: 90-110g/d Total Fluid Estimated Needs: 1.4-1.6L/day  Current Nutrition:  Soft diet + nutritional supplements, not meeting PO needs yet   Plan: Transitioned to *Cyclic* TPN on 4/32/76.   to run over 16 hours per MD request. [name] at 2000 per discussion with Dietician to allow patient time for walking in afternoon/evenings.  To run over 16 hours: -Start rate at 49 mL/hr for 1 hour. -Increase rate to 98 mL/hr for 13 hours.  -Decrease rate to 49 mL/hr for 1 hour. -Decrease rate to 25 mL/hr for 1 hour, then stop.  Plan:  See cyclic TPN rate above Continue Nutritional  Components Amino acids (using 15% Clinisol): 101 g Dextrose 19% = 266 g Lipids (using 20% SMOFlipids): 50.4 g kCal: 1817/24h  Electrolytes in TPN: Na 68mq/L, K 426m/L, Ca 86m33mL, Mg 10 mEq/L, Phos 10 mmol/L.  Continue ratio Cl: Acet 1:1  Hold MVI, trace elements, chromium and zinc for 2/7 per dietary recs. Continue CBG/SSI for Cyclic TPN:  -CBG 2 hrs after cyclic TPN start -CBG during middle of cyclic TPN infusion -CBG 1 hr after cylic TPN stopped -CBG while off TPN Continue resistant SSI  and continue insulin in TPN at 17 units (decreased from 20 units on 1/21) Check TPN labs on Mon/Thurs at minimum zinc 5 mg added to TPN 12/16 per dietician recommendation.  HowLorin PicketharmD Clinical Pharmacist 10/15/2022 9:05 AM

## 2022-10-16 LAB — CBC
HCT: 22.5 % — ABNORMAL LOW (ref 36.0–46.0)
Hemoglobin: 7.1 g/dL — ABNORMAL LOW (ref 12.0–15.0)
MCH: 27.5 pg (ref 26.0–34.0)
MCHC: 31.6 g/dL (ref 30.0–36.0)
MCV: 87.2 fL (ref 80.0–100.0)
Platelets: 117 10*3/uL — ABNORMAL LOW (ref 150–400)
RBC: 2.58 MIL/uL — ABNORMAL LOW (ref 3.87–5.11)
RDW: 16.8 % — ABNORMAL HIGH (ref 11.5–15.5)
WBC: 3.9 10*3/uL — ABNORMAL LOW (ref 4.0–10.5)
nRBC: 0 % (ref 0.0–0.2)

## 2022-10-16 LAB — GLUCOSE, CAPILLARY
Glucose-Capillary: 154 mg/dL — ABNORMAL HIGH (ref 70–99)
Glucose-Capillary: 134 mg/dL — ABNORMAL HIGH (ref 70–99)
Glucose-Capillary: 140 mg/dL — ABNORMAL HIGH (ref 70–99)
Glucose-Capillary: 108 mg/dL — ABNORMAL HIGH (ref 70–99)
Glucose-Capillary: 124 mg/dL — ABNORMAL HIGH (ref 70–99)

## 2022-10-16 LAB — IRON AND TIBC
Iron: 23 ug/dL — ABNORMAL LOW (ref 28–170)
Saturation Ratios: 6 % — ABNORMAL LOW (ref 10.4–31.8)
TIBC: 364 ug/dL (ref 250–450)
UIBC: 341 ug/dL

## 2022-10-16 LAB — COMPREHENSIVE METABOLIC PANEL
ALT: 26 U/L (ref 0–44)
AST: 33 U/L (ref 15–41)
Albumin: 2.5 g/dL — ABNORMAL LOW (ref 3.5–5.0)
Alkaline Phosphatase: 135 U/L — ABNORMAL HIGH (ref 38–126)
Anion gap: 6 (ref 5–15)
BUN: 21 mg/dL — ABNORMAL HIGH (ref 6–20)
CO2: 22 mmol/L (ref 22–32)
Calcium: 7.9 mg/dL — ABNORMAL LOW (ref 8.9–10.3)
Chloride: 105 mmol/L (ref 98–111)
Creatinine, Ser: 0.43 mg/dL — ABNORMAL LOW (ref 0.44–1.00)
GFR, Estimated: >60 mL/min (ref >60)
Glucose, Bld: 149 mg/dL — ABNORMAL HIGH (ref 70–99)
Potassium: 4 mmol/L (ref 3.5–5.1)
Sodium: 133 mmol/L — ABNORMAL LOW (ref 135–145)
Total Bilirubin: 0.4 mg/dL (ref 0.3–1.2)
Total Protein: 6.1 g/dL — ABNORMAL LOW (ref 6.5–8.1)

## 2022-10-16 LAB — FERRITIN: Ferritin: 17 ng/mL (ref 11–307)

## 2022-10-16 LAB — PHOSPHORUS: Phosphorus: 3.3 mg/dL (ref 2.5–4.6)

## 2022-10-16 LAB — PREPARE RBC (CROSSMATCH)

## 2022-10-16 LAB — VITAMIN B12: Vitamin B-12: 585 pg/mL (ref 180–914)

## 2022-10-16 LAB — FOLATE: Folate: 14.5 ng/mL (ref >5.9)

## 2022-10-16 LAB — TRANSFERRIN: Transferrin: 261 mg/dL (ref 192–382)

## 2022-10-16 LAB — MAGNESIUM: Magnesium: 2 mg/dL (ref 1.7–2.4)

## 2022-10-16 MED ORDER — ZINC CHLORIDE 1 MG/ML IV SOLN
INTRAVENOUS | Status: AC
  Filled 2022-10-16: qty 672

## 2022-10-16 MED ORDER — SODIUM CHLORIDE 0.9% IV SOLUTION: Freq: Once | INTRAVENOUS | Status: AC

## 2022-10-16 NOTE — Consult Note (Signed)
[place] Nurse wound follow up: Large [name] pouch with window changed by bedside nurse on 2/7 related to leaking.  Current pouch is intact with good seal. Pattern in the room; abd full thickness wound with fistulas; small amt blood-tinged liquid in the pouch.  Patient is performing intermittent suction by inserting yankour periodically. Extra pouch available at the bedside and instructions have been provided if bedside nurses need to change the pouch PRN if leaking.   Thank-you,  Julien Girt MSN, [place], [place], [place], [place]

## 2022-10-16 NOTE — Progress Notes (Signed)
PHARMACY - TOTAL PARENTERAL NUTRITION CONSULT NOTE   Indication: Prolonged ileus  Patient Measurements: Height: '4\' 11"'$  (149.9 cm) Weight: 96.7 kg (213 lb 3 oz) IBW/kg (Calculated) : 43.2 TPN AdjBW (KG): 55.2 Body mass index is 43.06 kg/m.  Assessment: Debra [name] is a 57 y.o. female s/p laparotomy, excision of greater omental mass, abdominal wall reconstruction with [name], appendectomy, and placement of Prevena vac.  Glucose / Insulin:  --No apparent history of diabetes --BG 97-154 --rSSI 4x/day(patient currently refusing) --SSI: 7u last 24h  17 units insulin in TPN (decreased from 20 units on 1/21)  Electrolytes: WNL Renal: SCr stable at approximate baseline  Hepatic: LFTs wnl --bilirubin wnl GI Imaging: 9/11 CTAP: no new acute issues GI Surgeries / Procedures: s/p laparotomy, excision of greater omental mass, abdominal wall reconstruction with [name], appendectomy, and placement of Prevena vac  Central access: 02/15/22 TPN start date: 02/15/22  Nutritional Goals: Goal cyclic TPN over 16 hrs: cyclic TPN over 16 hrs: (provides 102 g of protein and 1811 kcals per day) total volume over 16hrs for calculations=1400 ml (1500 ml total with overfill)  RD Assessment:  Estimated Needs Total Energy Estimated Needs: 1800-2100kcal/day Total Protein Estimated Needs: 90-110g/d Total Fluid Estimated Needs: 1.4-1.6L/day  Current Nutrition:  Soft diet + nutritional supplements, not meeting PO needs yet   Plan: Transitioned to *Cyclic* TPN on 10/04/49.   to run over 16 hours per MD request. [name] at 2000 per discussion with Dietician to allow patient time for walking in afternoon/evenings.  To run over 16 hours: -Start rate at 49 mL/hr for 1 hour. -Increase rate to 98 mL/hr for 13 hours.  -Decrease rate to 49 mL/hr for 1 hour. -Decrease rate to 25 mL/hr for 1 hour, then stop.  Plan:  See cyclic TPN rate above Continue Nutritional  Components Amino acids (using 15% Clinisol): 101 g Dextrose 19% = 266 g Lipids (using 20% SMOFlipids): 50.4 g kCal: 1817/24h  Electrolytes in TPN: Na 52mq/L, K 439m/L, Ca 63m42mL, Mg 10 mEq/L, Phos 10 mmol/L.  Continue ratio Cl: Acet 1:1  Resume trace elements, chromium and zinc per dietary recs. Continue CBG/SSI for Cyclic TPN:  -CBG 2 hrs after cyclic TPN start -CBG during middle of cyclic TPN infusion -CBG 1 hr after cylic TPN stopped -CBG while off TPN Continue resistant SSI  and continue insulin in TPN at 17 units (decreased from 20 units on 1/21) Check TPN labs on Mon/Thurs at minimum Zinc reduced to 2 mg added to TPN per dietician recommendations 2/8  HowLorin PicketharmD Clinical Pharmacist 10/16/2022 9:54 AM

## 2022-10-16 NOTE — Progress Notes (Signed)
Patient seen and examined.  No further bleeding. Tolerating some p.o. Ambulated yesterday Hemoglobin 7.1 today given deconditioning and intermittent bleeding I will transfuse 1 unit today On exam [name] pouch is in place w/o active bleeding.. Continue current care with [name] pouch, TPN.

## 2022-10-17 LAB — TYPE AND SCREEN
ABO/RH(D): O POS
Antibody Screen: NEGATIVE
Unit division: 0

## 2022-10-17 LAB — CBC
HCT: 24.7 % — ABNORMAL LOW (ref 36.0–46.0)
Hemoglobin: 8 g/dL — ABNORMAL LOW (ref 12.0–15.0)
MCH: 27.6 pg (ref 26.0–34.0)
MCHC: 32.4 g/dL (ref 30.0–36.0)
MCV: 85.2 fL (ref 80.0–100.0)
Platelets: 111 10*3/uL — ABNORMAL LOW (ref 150–400)
RBC: 2.9 MIL/uL — ABNORMAL LOW (ref 3.87–5.11)
RDW: 15.9 % — ABNORMAL HIGH (ref 11.5–15.5)
WBC: 3.7 10*3/uL — ABNORMAL LOW (ref 4.0–10.5)
nRBC: 0 % (ref 0.0–0.2)

## 2022-10-17 LAB — GLUCOSE, CAPILLARY
Glucose-Capillary: 157 mg/dL — ABNORMAL HIGH (ref 70–99)
Glucose-Capillary: 159 mg/dL — ABNORMAL HIGH (ref 70–99)
Glucose-Capillary: 102 mg/dL — ABNORMAL HIGH (ref 70–99)
Glucose-Capillary: 135 mg/dL — ABNORMAL HIGH (ref 70–99)

## 2022-10-17 LAB — BPAM RBC
Blood Product Expiration Date: 202403162359
ISSUE DATE / TIME: 202402081321
Unit Type and Rh: 5100

## 2022-10-17 LAB — MISC LABCORP TEST (SEND OUT): Labcorp test code: 81950

## 2022-10-17 MED ORDER — ZINC CHLORIDE 1 MG/ML IV SOLN
INTRAVENOUS | Status: AC
  Filled 2022-10-17: qty 672

## 2022-10-17 MED ORDER — SODIUM CHLORIDE 0.9 % IV SOLN
300.0000 mg | INTRAVENOUS | Status: AC
  Administered 2022-10-17 – 2022-10-19 (×3): 300 mg via INTRAVENOUS
  Filled 2022-10-17 (×3): qty 300

## 2022-10-17 NOTE — Progress Notes (Signed)
Patient seen and examined.  No further bleeding. Tolerating some p.o.  Hemoglobin 8 after rbc transfusion PE NAD, trying to get some rest, [name] pouch is in place w/o active bleeding..  A/p Continue current care with [name] pouch, TPN Labs Monday mobilize

## 2022-10-17 NOTE — Plan of Care (Signed)
  Problem: Nutrition: Goal: Adequate nutrition will be maintained Outcome: Progressing   

## 2022-10-17 NOTE — TOC Progression Note (Signed)
Transition of Care [facility]) - Progression Note    Patient Details  Name: Debra [name] MRN: [id number] Date of Birth: [birth year]  [facility] CM/SW Contact  Beverly Sessions, RN Phone Number: 10/17/2022, 1:22 PM  Clinical Narrative:    Per surgery No update as far as disposition    Expected Discharge Plan: [place] Barriers to Discharge: Continued Medical Work up  Expected Discharge Plan and Services                                               Social Determinants of Health (SDOH) Interventions SDOH Screenings   Food Insecurity: No Food Insecurity (05/20/2022)  Housing: Low Risk  (05/20/2022)  Transportation Needs: No Transportation Needs (05/20/2022)  Utilities: Not At Risk (05/20/2022)  Tobacco Use: Low Risk  (09/12/2022)    Readmission Risk Interventions     No data to display

## 2022-10-17 NOTE — Progress Notes (Signed)
PHARMACY - TOTAL PARENTERAL NUTRITION CONSULT NOTE   Indication: Prolonged ileus  Patient Measurements: Height: 4' 11"$  (149.9 cm) Weight: 95.2 kg (209 lb 14.1 oz) IBW/kg (Calculated) : 43.2 TPN AdjBW (KG): 55.2 Body mass index is 42.39 kg/m.  Assessment: Debra [name] is a 57 y.o. female s/p laparotomy, excision of greater omental mass, abdominal wall reconstruction with [name], appendectomy, and placement of Prevena vac.  Glucose / Insulin:  --No apparent history of diabetes --BG 108-157 --rSSI 4x/day(patient currently refusing) --SSI: 7u last 24h  17 units insulin in TPN (decreased from 20 units on 1/21)  Electrolytes: WNL Renal: SCr stable at approximate baseline  Hepatic: LFTs wnl --bilirubin wnl GI Imaging: 9/11 CTAP: no new acute issues GI Surgeries / Procedures: s/p laparotomy, excision of greater omental mass, abdominal wall reconstruction with [name], appendectomy, and placement of Prevena vac  Central access: 02/15/22 TPN start date: 02/15/22  Nutritional Goals: Goal cyclic TPN over 16 hrs: cyclic TPN over 16 hrs: (provides 102 g of protein and 1811 kcals per day) total volume over 16hrs for calculations=1400 ml (1500 ml total with overfill)  RD Assessment:  Estimated Needs Total Energy Estimated Needs: 1800-2100kcal/day Total Protein Estimated Needs: 90-110g/d Total Fluid Estimated Needs: 1.4-1.6L/day  Current Nutrition:  Soft diet + nutritional supplements, not meeting PO needs yet   Plan: Transitioned to *Cyclic* TPN on [id number].   to run over 16 hours per MD request. [name] at 2000 per discussion with Dietician to allow patient time for walking in afternoon/evenings.  To run over 16 hours: -Start rate at 49 mL/hr for 1 hour. -Increase rate to 98 mL/hr for 13 hours.  -Decrease rate to 49 mL/hr for 1 hour. -Decrease rate to 25 mL/hr for 1 hour, then stop.  Plan:  See cyclic TPN rate above Continue Nutritional  Components Amino acids (using 15% Clinisol): 101 g Dextrose 19% = 266 g Lipids (using 20% SMOFlipids): 50.4 g kCal: 1817/24h  Electrolytes in TPN: Na 1mq/L, K 460m/L, Ca 61m56mL, Mg 10 mEq/L, Phos 10 mmol/L.  Continue ratio Cl: Acet 1:1  Resume trace elements, chromium and zinc per dietary recs. Continue CBG/SSI for Cyclic TPN:  -CBG 2 hrs after cyclic TPN start -CBG during middle of cyclic TPN infusion -CBG 1 hr after cylic TPN stopped -CBG while off TPN Continue resistant SSI  and continue insulin in TPN at 17 units (decreased from 20 units on 1/21) Check TPN labs on Mon/Thurs at minimum Zinc reduced to 2 mg added to TPN per dietician recommendations 2/8  HowLorin PicketharmD Clinical Pharmacist 10/17/2022 10:25 AM

## 2022-10-18 LAB — MISC LABCORP TEST (SEND OUT): Labcorp test code: 70034

## 2022-10-18 MED ORDER — ZINC CHLORIDE 1 MG/ML IV SOLN
INTRAVENOUS | Status: AC
  Filled 2022-10-18: qty 672

## 2022-10-18 NOTE — Progress Notes (Signed)
Assume patient care. Patient resting in bed, no sign of acute distress noted at this time.

## 2022-10-18 NOTE — Progress Notes (Signed)
Patient ID: Debra [name], female   DOB: 06/24/1966, 57 y.o.   MRN: [id number]     [place] Hospital Day(s): 247.   Interval History: Patient seen and examined, no acute events or new complaints overnight. Patient reports having mild pain in the lower abdomen.  Denies any nausea or vomiting.  Denies any issues with the pouch.  Vital signs in last 24 hours: [min-max] current  Temp:  [98.1 F (36.7 C)-98.4 F (36.9 C)] 98.2 F (36.8 C) (02/10 0739) Pulse Rate:  [70-88] 75 (02/10 0739) Resp:  [16-20] 16 (02/10 0739) BP: (100-109)/(54-69) 100/57 (02/10 0739) SpO2:  [100 %] 100 % (02/10 0739) Weight:  [95.8 kg] 95.8 kg (02/10 0500)     Height: 4' 11"$  (149.9 cm) Weight: 95.8 kg BMI (Calculated): 43.04   Physical Exam:  Constitutional: alert, cooperative and no distress  Respiratory: breathing non-labored at rest  Cardiovascular: regular rate and sinus rhythm  Gastrointestinal: soft, non-tender, and non-distended.  Wound covered with [name] pouch.  No blood on pouch today.  Labs:     Latest Ref Rng & Units 10/17/2022    7:03 AM 10/16/2022   12:06 AM 10/14/2022   11:21 AM  CBC  WBC 4.0 - 10.5 K/uL 3.7  3.9  4.0   Hemoglobin 12.0 - 15.0 g/dL 8.0  7.1  7.5   Hematocrit 36.0 - 46.0 % 24.7  22.5  24.1   Platelets 150 - 400 K/uL 111  117  123       Latest Ref Rng & Units 10/16/2022    6:30 AM 10/13/2022    9:20 AM 10/09/2022    5:43 AM  CMP  Glucose 70 - 99 mg/dL 149  115  136   BUN 6 - 20 mg/dL 21  22  23   $ Creatinine 0.44 - 1.00 mg/dL 0.43  0.45  0.35   Sodium 135 - 145 mmol/L 133  136  136   Potassium 3.5 - 5.1 mmol/L 4.0  4.6  3.6   Chloride 98 - 111 mmol/L 105  107  107   CO2 22 - 32 mmol/L 22  23  24   $ Calcium 8.9 - 10.3 mg/dL 7.9  8.3  8.0   Total Protein 6.5 - 8.1 g/dL 6.1  6.3  6.4   Total Bilirubin 0.3 - 1.2 mg/dL 0.4  0.5  0.5   Alkaline Phos 38 - 126 U/L 135  136  142   AST 15 - 41 U/L 33  27  33   ALT 0 - 44 U/L 26  23  26     $ Imaging studies: No new  pertinent imaging studies   Assessment/Plan:  57 y.o. female with metastatic colon cancer s/p omentectomy and incisional hernia repair 245 Days Post-Op, complicated by pertinent comorbidities including enterocutaneous fistula.     -Patient today was found stable, no fever. -Chronic abdominal pain but no acute issues. -Hemoglobin stable. -Will continue with current management with TPN, soft diet. -Encouraged the patient to ambulate  [name], MD

## 2022-10-18 NOTE — Progress Notes (Signed)
PHARMACY - TOTAL PARENTERAL NUTRITION CONSULT NOTE   Indication: Prolonged ileus  Patient Measurements: Height: 4' 11"$  (149.9 cm) Weight: 95.8 kg (211 lb 3.2 oz) IBW/kg (Calculated) : 43.2 TPN AdjBW (KG): 55.2 Body mass index is 42.66 kg/m.  Assessment: Debra [name] is a 57 y.o. female s/p laparotomy, excision of greater omental mass, abdominal wall reconstruction with [name], appendectomy, and placement of Prevena vac.  Glucose / Insulin:  --No apparent history of diabetes --BG 108-157 --rSSI 4x/day(patient currently refusing) --SSI: 11u last 24h  17 units insulin in TPN (decreased from 20 units on 1/21)  Electrolytes: WNL Renal: SCr stable at approximate baseline  Hepatic: LFTs wnl --bilirubin wnl GI Imaging: 9/11 CTAP: no new acute issues GI Surgeries / Procedures: s/p laparotomy, excision of greater omental mass, abdominal wall reconstruction with [name], appendectomy, and placement of Prevena vac  Central access: 02/15/22 TPN start date: 02/15/22  Nutritional Goals: Goal cyclic TPN over 16 hrs: cyclic TPN over 16 hrs: (provides 102 g of protein and 1811 kcals per day) total volume over 16hrs for calculations=1400 ml (1500 ml total with overfill)  RD Assessment:  Estimated Needs Total Energy Estimated Needs: 1800-2100kcal/day Total Protein Estimated Needs: 90-110g/d Total Fluid Estimated Needs: 1.4-1.6L/day  Current Nutrition:  Soft diet + nutritional supplements, not meeting PO needs yet   Plan: Transitioned to *Cyclic* TPN on [id number].   to run over 16 hours per MD request. [name] at 2000 per discussion with Dietician to allow patient time for walking in afternoon/evenings.  To run over 16 hours: -Start rate at 49 mL/hr for 1 hour. -Increase rate to 98 mL/hr for 13 hours.  -Decrease rate to 49 mL/hr for 1 hour. -Decrease rate to 25 mL/hr for 1 hour, then stop.  Plan:  See cyclic TPN rate above Continue Nutritional  Components Amino acids (using 15% Clinisol): 101 g Dextrose 19% = 266 g Lipids (using 20% SMOFlipids): 50.4 g kCal: 1817/24h  Electrolytes in TPN: Na 96mq/L, K 446m/L, Ca 84m33mL, Mg 10 mEq/L, Phos 10 mmol/L.  Continue ratio Cl: Acet 1:1  Resume trace elements, chromium and zinc per dietary recs. Continue CBG/SSI for Cyclic TPN:  -CBG 2 hrs after cyclic TPN start -CBG during middle of cyclic TPN infusion -CBG 1 hr after cylic TPN stopped -CBG while off TPN Continue resistant SSI  and continue insulin in TPN at 17 units (decreased from 20 units on 1/21) Check TPN labs on Mon/Thurs at minimum Zinc reduced to 2 mg added to TPN per dietician recommendations 2/8  KisOswald HillockharmD Clinical Pharmacist 10/18/2022 8:51 AM

## 2022-10-19 LAB — VITAMIN A: Vitamin A (Retinoic Acid): 27.1 ug/dL (ref 20.1–62.0)

## 2022-10-19 LAB — VITAMIN C: Vitamin C: 0.7 mg/dL (ref 0.4–2.0)

## 2022-10-19 LAB — VITAMIN E
Vitamin E (Alpha Tocopherol): 11.6 mg/L (ref 7.0–25.1)
Vitamin E(Gamma Tocopherol): 1.1 mg/L (ref 0.5–5.5)

## 2022-10-19 MED ORDER — ZINC CHLORIDE 1 MG/ML IV SOLN
INTRAVENOUS | Status: AC
  Filled 2022-10-19: qty 672

## 2022-10-19 NOTE — Progress Notes (Signed)
Patient ID: Debra [name], female   DOB: [birth year], 57 y.o.   MRN: [id number]     [place] Hospital Day(s): 248.   Interval History: Patient seen and examined, no acute events or new complaints overnight. Patient reports feeling well.  She denies any new complaint.  She denies any nausea or vomiting.  No complaint of pain today.  No issues with the pouch.  Vital signs in last 24 hours: [min-max] current  Temp:  [97.9 F (36.6 C)-98.7 F (37.1 C)] 97.9 F (36.6 C) (02/11 0927) Pulse Rate:  [71-78] 71 (02/11 0927) Resp:  [16-20] 18 (02/11 0927) BP: (98-101)/(52-57) 98/57 (02/11 0927) SpO2:  [98 %-100 %] 100 % (02/11 0927) Weight:  [95 kg] 95 kg (02/11 0434)     Height: 4' 11"$  (149.9 cm) Weight: 95 kg BMI (Calculated): 43.04   Physical Exam:  Constitutional: alert, cooperative and no distress  Respiratory: breathing non-labored at rest  Cardiovascular: regular rate and sinus rhythm  Gastrointestinal: soft, non-tender, and non-distended.  Large open wound covered with [name] pouch.  No fresh blood seen.  Minimal old blood pooled in the pouch.  Labs:     Latest Ref Rng & Units 10/17/2022    7:03 AM 10/16/2022   12:06 AM 10/14/2022   11:21 AM  CBC  WBC 4.0 - 10.5 K/uL 3.7  3.9  4.0   Hemoglobin 12.0 - 15.0 g/dL 8.0  7.1  7.5   Hematocrit 36.0 - 46.0 % 24.7  22.5  24.1   Platelets 150 - 400 K/uL 111  117  123       Latest Ref Rng & Units 10/16/2022    6:30 AM 10/13/2022    9:20 AM 10/09/2022    5:43 AM  CMP  Glucose 70 - 99 mg/dL 149  115  136   BUN 6 - 20 mg/dL 21  22  23   $ Creatinine 0.44 - 1.00 mg/dL 0.43  0.45  0.35   Sodium 135 - 145 mmol/L 133  136  136   Potassium 3.5 - 5.1 mmol/L 4.0  4.6  3.6   Chloride 98 - 111 mmol/L 105  107  107   CO2 22 - 32 mmol/L 22  23  24   $ Calcium 8.9 - 10.3 mg/dL 7.9  8.3  8.0   Total Protein 6.5 - 8.1 g/dL 6.1  6.3  6.4   Total Bilirubin 0.3 - 1.2 mg/dL 0.4  0.5  0.5   Alkaline Phos 38 - 126 U/L 135  136  142   AST 15 - 41  U/L 33  27  33   ALT 0 - 44 U/L 26  23  26     $ Imaging studies: No new pertinent imaging studies   Assessment/Plan:  57 y.o. female with metastatic colon cancer s/p omentectomy and incisional hernia repair 248 Days Post-Op, complicated by pertinent comorbidities including enterocutaneous fistula.     -Stable vital signs, no fever -No clinical deterioration -Tolerating diet.  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

## 2022-10-19 NOTE — Progress Notes (Signed)
PHARMACY - TOTAL PARENTERAL NUTRITION CONSULT NOTE   Indication: Prolonged ileus  Patient Measurements: Height: 4' 11"$  (149.9 cm) Weight: 95 kg (209 lb 7 oz) IBW/kg (Calculated) : 43.2 TPN AdjBW (KG): 55.2 Body mass index is 42.3 kg/m.  Assessment: Debra [name] is a 57 y.o. female s/p laparotomy, excision of greater omental mass, abdominal wall reconstruction with [name], appendectomy, and placement of Prevena vac.  Glucose / Insulin:  --No apparent history of diabetes --BG 108-157 --rSSI 4x/day(patient currently refusing) --SSI: 11u last 24h  17 units insulin in TPN (decreased from 20 units on 1/21)  Electrolytes: WNL Renal: SCr stable at approximate baseline  Hepatic: LFTs wnl --bilirubin wnl GI Imaging: 9/11 CTAP: no new acute issues GI Surgeries / Procedures: s/p laparotomy, excision of greater omental mass, abdominal wall reconstruction with [name], appendectomy, and placement of Prevena vac  Central access: 02/15/22 TPN start date: 02/15/22  Nutritional Goals: Goal cyclic TPN over 16 hrs: cyclic TPN over 16 hrs: (provides 102 g of protein and 1811 kcals per day) total volume over 16hrs for calculations=1400 ml (1500 ml total with overfill)  RD Assessment:  Estimated Needs Total Energy Estimated Needs: 1800-2100kcal/day Total Protein Estimated Needs: 90-110g/d Total Fluid Estimated Needs: 1.4-1.6L/day  Current Nutrition:  Soft diet + nutritional supplements, not meeting PO needs yet   Plan: Transitioned to *Cyclic* TPN on [id number].   to run over 16 hours per MD request. [name] at 2000 per discussion with Dietician to allow patient time for walking in afternoon/evenings.  To run over 16 hours: -Start rate at 49 mL/hr for 1 hour. -Increase rate to 98 mL/hr for 13 hours.  -Decrease rate to 49 mL/hr for 1 hour. -Decrease rate to 25 mL/hr for 1 hour, then stop.  Plan:  See cyclic TPN rate above Continue Nutritional  Components Amino acids (using 15% Clinisol): 101 g Dextrose 19% = 266 g Lipids (using 20% SMOFlipids): 50.4 g kCal: 1817/24h  Electrolytes in TPN: Na 38mq/L, K 4646m/L, Ca 46m62mL, Mg 10 mEq/L, Phos 10 mmol/L.  Continue ratio Cl: Acet 1:1  Resume trace elements, chromium and zinc per dietary recs. Continue CBG/SSI for Cyclic TPN:  -CBG 2 hrs after cyclic TPN start -CBG during middle of cyclic TPN infusion -CBG 1 hr after cylic TPN stopped -CBG while off TPN Continue resistant SSI  and continue insulin in TPN at 17 units (decreased from 20 units on 1/21) Check TPN labs on Mon/Thurs at minimum Zinc reduced to 2 mg added to TPN per dietician recommendations 2/8  KisOswald HillockharmD Clinical Pharmacist 10/19/2022 9:11 AM

## 2022-10-19 NOTE — Plan of Care (Signed)
  Problem: Clinical Measurements: Goal: Will remain free from infection Outcome: Progressing   Problem: Nutrition: Goal: Adequate nutrition will be maintained Outcome: Progressing   Problem: Activity: Goal: Risk for activity intolerance will decrease Outcome: Progressing   Problem: Nutrition: Goal: Adequate nutrition will be maintained Outcome: Progressing

## 2022-10-20 LAB — SELENIUM SERUM: Selenium: 75 ug/L — ABNORMAL LOW (ref 93–198)

## 2022-10-20 LAB — COMPREHENSIVE METABOLIC PANEL
ALT: 23 U/L (ref 0–44)
AST: 28 U/L (ref 15–41)
Albumin: 2.4 g/dL — ABNORMAL LOW (ref 3.5–5.0)
Alkaline Phosphatase: 141 U/L — ABNORMAL HIGH (ref 38–126)
Anion gap: 4 — ABNORMAL LOW (ref 5–15)
BUN: 22 mg/dL — ABNORMAL HIGH (ref 6–20)
CO2: 22 mmol/L (ref 22–32)
Calcium: 8.2 mg/dL — ABNORMAL LOW (ref 8.9–10.3)
Chloride: 108 mmol/L (ref 98–111)
Creatinine, Ser: 0.45 mg/dL (ref 0.44–1.00)
GFR, Estimated: >60 mL/min (ref >60)
Glucose, Bld: 129 mg/dL — ABNORMAL HIGH (ref 70–99)
Potassium: 4 mmol/L (ref 3.5–5.1)
Sodium: 134 mmol/L — ABNORMAL LOW (ref 135–145)
Total Bilirubin: 0.6 mg/dL (ref 0.3–1.2)
Total Protein: 6 g/dL — ABNORMAL LOW (ref 6.5–8.1)

## 2022-10-20 LAB — GLUCOSE, CAPILLARY
Glucose-Capillary: 152 mg/dL — ABNORMAL HIGH (ref 70–99)
Glucose-Capillary: 107 mg/dL — ABNORMAL HIGH (ref 70–99)
Glucose-Capillary: 132 mg/dL — ABNORMAL HIGH (ref 70–99)
Glucose-Capillary: 106 mg/dL — ABNORMAL HIGH (ref 70–99)
Glucose-Capillary: 131 mg/dL — ABNORMAL HIGH (ref 70–99)
Glucose-Capillary: 136 mg/dL — ABNORMAL HIGH (ref 70–99)
Glucose-Capillary: 81 mg/dL (ref 70–99)
Glucose-Capillary: 153 mg/dL — ABNORMAL HIGH (ref 70–99)
Glucose-Capillary: 167 mg/dL — ABNORMAL HIGH (ref 70–99)
Glucose-Capillary: 108 mg/dL — ABNORMAL HIGH (ref 70–99)
Glucose-Capillary: 118 mg/dL — ABNORMAL HIGH (ref 70–99)

## 2022-10-20 LAB — MISC LABCORP TEST (SEND OUT): Labcorp test code: 84255

## 2022-10-20 LAB — MAGNESIUM: Magnesium: 2.1 mg/dL (ref 1.7–2.4)

## 2022-10-20 LAB — PHOSPHORUS: Phosphorus: 3.1 mg/dL (ref 2.5–4.6)

## 2022-10-20 MED ORDER — ZINC CHLORIDE 1 MG/ML IV SOLN
INTRAVENOUS | Status: AC
  Filled 2022-10-20: qty 672

## 2022-10-20 NOTE — Progress Notes (Signed)
Patient seen and examined.  Ambulating in the halls Had some clorts in the eakin pouch this am from Ec fistula this am Tolerating some p.o.    PE NAD,t, Debra [name] pouch is in place w/o active bleeding..   A/p Continue current care with [name] pouch, TPN mobilize

## 2022-10-20 NOTE — Progress Notes (Signed)
PHARMACY - TOTAL PARENTERAL NUTRITION CONSULT NOTE   Indication: Prolonged ileus  Patient Measurements: Height: 4' 11"$  (149.9 cm) Weight: 96.6 kg (212 lb 15.4 oz) IBW/kg (Calculated) : 43.2 TPN AdjBW (KG): 55.2 Body mass index is 43.01 kg/m.  Assessment: Debra [name] is a 57 y.o. female s/p laparotomy, excision of greater omental mass, abdominal wall reconstruction with [name], appendectomy, and placement of Prevena vac.  Glucose / Insulin:  --No apparent history of diabetes --BG 81 - 167 --rSSI 4x/day --SSI: 11u last 24h  17 units insulin in TPN (decreased from 20 units on 1/21)  Electrolytes: WNL Renal: SCr stable at approximate baseline  Hepatic: LFTs wnl --bilirubin wnl GI Imaging: 9/11 CTAP: no new acute issues GI Surgeries / Procedures: s/p laparotomy, excision of greater omental mass, abdominal wall reconstruction with [name], appendectomy, and placement of Prevena vac  Central access: 02/15/22 TPN start date: 02/15/22  Nutritional Goals: Goal cyclic TPN over 16 hrs: cyclic TPN over 16 hrs: (provides 102 g of protein and 1811 kcals per day) total volume over 16hrs for calculations=1400 ml (1500 ml total with overfill)  RD Assessment:  Estimated Needs Total Energy Estimated Needs: 1800-2100kcal/day Total Protein Estimated Needs: 90-110g/d Total Fluid Estimated Needs: 1.4-1.6L/day  Current Nutrition:  Soft diet + nutritional supplements, not meeting PO needs yet   Plan: Transitioned to *Cyclic* TPN on [id number].   to run over 16 hours per MD request. [name] at 2000 per discussion with Dietician to allow patient time for walking in afternoon/evenings.  To run over 16 hours: -Start rate at 49 mL/hr for 1 hour. -Increase rate to 98 mL/hr for 13 hours.  -Decrease rate to 49 mL/hr for 1 hour. -Decrease rate to 25 mL/hr for 1 hour, then stop.  Plan:  See cyclic TPN rate above Continue Nutritional Components Amino acids (using 15%  Clinisol): 101 g Dextrose 19% = 266 g Lipids (using 20% SMOFlipids): 50.4 g kCal: 1817/24h  Electrolytes in TPN: Na 78mq/L, K 459m/L, Ca 10m510mL, Mg 10 mEq/L, Phos 10 mmol/L.  Continue ratio Cl: Acet 1:1  Resume trace elements, chromium and zinc per dietary recs. Continue CBG/SSI for Cyclic TPN:  -CBG 2 hrs after cyclic TPN start -CBG during middle of cyclic TPN infusion -CBG 1 hr after cylic TPN stopped -CBG while off TPN Continue resistant SSI  and continue insulin in TPN at 17 units (decreased from 20 units on 1/21) Check TPN labs on Mon/Thurs at minimum Zinc reduced to 2 mg added to TPN per dietician recommendations 2/8  [name] Clinical Pharmacist 10/20/2022 7:02 AM

## 2022-10-20 NOTE — TOC Progression Note (Signed)
Transition of Care [facility]) - Progression Note    Patient Details  Name: Debra [name] MRN: [id number] Date of Birth: October 07, 1965  [facility] CM/SW Contact  Beverly Sessions, RN Phone Number: 10/20/2022, 1:28 PM  Clinical Narrative:    Per surgery No update as far as disposition   TOC supervisor aware    Expected Discharge Plan: [place] Barriers to Discharge: Continued Medical Work up  Expected Discharge Plan and Services                                               Social Determinants of Health (SDOH) Interventions SDOH Screenings   Food Insecurity: No Food Insecurity (05/20/2022)  Housing: Low Risk  (05/20/2022)  Transportation Needs: No Transportation Needs (05/20/2022)  Utilities: Not At Risk (05/20/2022)  Tobacco Use: Low Risk  (09/12/2022)    Readmission Risk Interventions     No data to display

## 2022-10-21 LAB — GLUCOSE, CAPILLARY
Glucose-Capillary: 119 mg/dL — ABNORMAL HIGH (ref 70–99)
Glucose-Capillary: 133 mg/dL — ABNORMAL HIGH (ref 70–99)
Glucose-Capillary: 129 mg/dL — ABNORMAL HIGH (ref 70–99)
Glucose-Capillary: 105 mg/dL — ABNORMAL HIGH (ref 70–99)
Glucose-Capillary: 133 mg/dL — ABNORMAL HIGH (ref 70–99)

## 2022-10-21 LAB — MISC LABCORP TEST (SEND OUT): Labcorp test code: 71522

## 2022-10-21 LAB — VITAMIN B1: Vitamin B1 (Thiamine): 75.8 nmol/L (ref 66.5–200.0)

## 2022-10-21 MED ORDER — ZINC CHLORIDE 1 MG/ML IV SOLN
INTRAVENOUS | Status: AC
  Filled 2022-10-21: qty 672

## 2022-10-21 NOTE — Progress Notes (Signed)
PHARMACY - TOTAL PARENTERAL NUTRITION CONSULT NOTE   Indication: Prolonged ileus  Patient Measurements: Height: 4' 11"$  (149.9 cm) Weight: 96.4 kg (212 lb 8.4 oz) IBW/kg (Calculated) : 43.2 TPN AdjBW (KG): 55.2 Body mass index is 42.92 kg/m.  Assessment: Debra [name] is a 57 y.o. female s/p laparotomy, excision of greater omental mass, abdominal wall reconstruction with [name], appendectomy, and placement of Prevena vac.  Glucose / Insulin:  --No apparent history of diabetes --BG 108 - 129 --rSSI 4x/day --SSI: 3u last 24h  17 units insulin in TPN (decreased from 20 units on 1/21)  Electrolytes: WNL Renal: SCr stable at approximate baseline  Hepatic: LFTs wnl --bilirubin wnl GI Imaging: 9/11 CTAP: no new acute issues GI Surgeries / Procedures: s/p laparotomy, excision of greater omental mass, abdominal wall reconstruction with [name], appendectomy, and placement of Prevena vac  Central access: 02/15/22 TPN start date: 02/15/22  Nutritional Goals: Goal cyclic TPN over 16 hrs: cyclic TPN over 16 hrs: (provides 102 g of protein and 1811 kcals per day) total volume over 16hrs for calculations=1400 ml (1500 ml total with overfill)  RD Assessment:  Estimated Needs Total Energy Estimated Needs: 1800-2100kcal/day Total Protein Estimated Needs: 90-110g/d Total Fluid Estimated Needs: 1.4-1.6L/day  Current Nutrition:  Soft diet + nutritional supplements, not meeting PO needs yet   Plan: Transitioned to *Cyclic* TPN on [id number].   to run over 16 hours per MD request. [name] at 2000 per discussion with Dietician to allow patient time for walking in afternoon/evenings.  To run over 16 hours: -Start rate at 49 mL/hr for 1 hour. -Increase rate to 98 mL/hr for 13 hours.  -Decrease rate to 49 mL/hr for 1 hour. -Decrease rate to 25 mL/hr for 1 hour, then stop.  Plan:  See cyclic TPN rate above Continue Nutritional Components Amino acids (using 15%  Clinisol): 101 g Dextrose 19% = 266 g Lipids (using 20% SMOFlipids): 50.4 g kCal: 1817/24h  Electrolytes in TPN: Na 51mq/L, K 462m/L, Ca 40m740mL, Mg 10 mEq/L, Phos 10 mmol/L.  Continue ratio Cl: Acet 1:1  Resume trace elements, chromium and zinc per dietary recs. Continue CBG/SSI for Cyclic TPN:  -CBG 2 hrs after cyclic TPN start -CBG during middle of cyclic TPN infusion -CBG 1 hr after cylic TPN stopped -CBG while off TPN Continue resistant SSI  and continue insulin in TPN at 17 units (decreased from 20 units on 1/21) Check TPN labs on Mon/Thurs at minimum Zinc reduced to 2 mg added to TPN per dietician recommendations 2/8 Selenium 60 mcg daily added to TPN based on RD recommendations  [name] Clinical Pharmacist 10/21/2022 8:25 AM

## 2022-10-21 NOTE — Progress Notes (Signed)
Nutrition Follow-up  DOCUMENTATION CODES:   Obesity unspecified  INTERVENTION:   Continue cyclic TPN per pharmacy (16 hrs)- provides 1811kcal/day and 102g/day protein   Ensure Enlive po BID, each supplement provides 350 kcal and 20 grams of protein.  MVI, chromium and trace elements in TPN   Triglycerides checked monthly   Daily weights   Vitamin D2- 50,000 units po weekly   Zinc additional '2mg'$  daily added to TPN   Selenium additional 33mg daily added to TPN   Iron sucrose '300mg'$  IV daily x 3 days (completed 2/11)  Vitamin labs next due 4/9  NUTRITION DIAGNOSIS:   Increased nutrient needs related to wound healing, catabolic illness as evidenced by estimated needs. -ongoing   GOAL:   Patient will meet greater than or equal to 90% of their needs -met with TPN   MONITOR:   PO intake, Supplement acceptance, Labs, Weight trends, Diet advancement, I & O's, TPN  ASSESSMENT:   57y/o female with h/o hypothyroidism, COVID 19 (3/21), kidney stones and stage 3 colon cancer (s/p left hemicolectomy 5/21 and chemotherapy) who is admitted for new pelvic mass now s/p laparotomy 6/8 (with excision of pelvic mass from greater omentum, abdominal wall reconstruction with bilateral myocutaneous flaps and mesh, incisional hernia repair, appendectomy repair and VAC placement) complicated by bowel perforation s/p reopening of recent laparotomy 6/10 (with repair of small bowel perforation, excision of mesh, placement of two phasix mesh and VAC placement). Pathology returned as metastatic adenocarcinoma. Pt with L1 compression fracture.   Pt continues to tolerate TPN well at goal rate; TPN is currently being cycled for 16 hrs. Triglycerides wnl and are being checked monthly. Hyperglycemia improved with insulin in TPN. Volume status improved; pt up ~15lbs since admission. Will check routine vitamin labs every two months as pt on chronic TPN (next due 4/9). Pt continues to have poor oral intake; pt  eating <25% of meals. Pt on soft diet. [name] pouch with 1070m+ unmeasured output. No discharge plan in place.   Medications reviewed and include: D2, insulin, synthroid, protonix, TPN  -Selenium 75(L), Chromium- pending, copper- pending, Manganese- pending, zinc- pending, vitamin D 34.2 wnl, Iodine 74.2 wnl- 2/8 -Iron 23(L), TIBC 364, ferritin 17 wnl, transferrin 261 wnl, folate 14.5 wnl, B12 585- 2/8 -Vitamin B1- 75.8, vitamin A- 27.1, B6- pending, vitamin C- 0.7, vitamin E- 11.6 wnl, vitamin K- pending- 2/8  Labs reviewed: Na 134(L), K 4.0 wnl, BUN 22(H), P 3.1 wnl, Mg 2.1 wnl- 2/12 Triglycerides- 90- 2/5 WBC- 3.7(L), Hgb 8.0(L), Hct 24.7(L) Cbgs- 133, 119, 118, 108 x 48 hrs  Diet Order:    Diet Order             DIET SOFT Room service appropriate? Yes; Fluid consistency: Thin  Diet effective now                  EDUCATION NEEDS:   Not appropriate for education at this time  Skin:  Skin Assessment: Reviewed RN Assessment ( Midline wound: 11cm x 12cm )  Last BM:  2/12- type 6  Height:   Ht Readings from Last 1 Encounters:  03/01/22 '4\' 11"'$  (1.499 m)    Weight:   Wt Readings from Last 1 Encounters:  10/21/22 96.4 kg    Ideal Body Weight:  44.3 kg  BMI:  Body mass index is 42.92 kg/m.  Estimated Nutritional Needs:   Kcal:  1800-2100kcal/day  Protein:  90-110g/d  Fluid:  1.4-1.6L/day  CaKoleen DistanceS, RD, LDN Please  refer to [facility] for RD and/or RD on-call/weekend/after hours pager 

## 2022-10-21 NOTE — Progress Notes (Signed)
[facility] SURGICAL PROGRESS NOTE (cpt [phone])  Hospital Day(s): 250.   Post op day(s): 248 Days Post-Op.   Interval History:  Patient seen and examined No acute issues No new labs this AM [name] output 100 ccs + unmeasured; managing effluent independently On TPN; On Soft diet She is ambulating better; reviewed mobility notes   Review of Systems:  Constitutional: denies fever, chills  HEENT: denies cough or congestion  Respiratory: denies any shortness of breath  Cardiovascular: denies chest pain or palpitations  Gastrointestinal: denied abdominal pain, denies N/V Genitourinary: denies burning with urination or urinary frequency Integumentary: + midline wound (stable)   Vital signs in last 24 hours: [min-max] current  Temp:  [98.1 F (36.7 C)-98.4 F (36.9 C)] 98.2 F (36.8 C) (02/13 0537) Pulse Rate:  [72-80] 78 (02/12 2015) Resp:  [18] 18 (02/13 0537) BP: (104-113)/(54-59) 104/54 (02/13 0537) SpO2:  [98 %-100 %] 98 % (02/12 2015) Weight:  [96.4 kg] 96.4 kg (02/13 0500)     Height: 4' 11"$  (149.9 cm) Weight: 96.4 kg BMI (Calculated): 43.04   Intake/Output last 2 shifts:  02/12 0701 - 02/13 0700 In: 273.5 [P.O.:100; I.V.:173.5] Out: 100 [Drains:100]   Physical Exam:  Constitutional: alert, cooperative and no distress  Respiratory: breathing non-labored at rest  Cardiovascular: regular rate and sinus rhythm  Gastrointestinal: Soft, rather unchanged suprapubic tenderness (mild), non-distended, no rebound/guarding. Integumentary:  Port removal site in right chest is healing via secondary intention; no erythema. Midline wound open, peritoneum closed; granulating; there are three stomatized areas visible in the LUQ and LLQ portions of the wound   Labs:     Latest Ref Rng & Units 10/17/2022    7:03 AM 10/16/2022   12:06 AM 10/14/2022   11:21 AM  CBC  WBC 4.0 - 10.5 K/uL 3.7  3.9  4.0   Hemoglobin 12.0 - 15.0 g/dL 8.0  7.1  7.5   Hematocrit 36.0 - 46.0 %  24.7  22.5  24.1   Platelets 150 - 400 K/uL 111  117  123       Latest Ref Rng & Units 10/20/2022    7:50 AM 10/16/2022    6:30 AM 10/13/2022    9:20 AM  CMP  Glucose 70 - 99 mg/dL 129  149  115   BUN 6 - 20 mg/dL 22  21  22   $ Creatinine 0.44 - 1.00 mg/dL 0.45  0.43  0.45   Sodium 135 - 145 mmol/L 134  133  136   Potassium 3.5 - 5.1 mmol/L 4.0  4.0  4.6   Chloride 98 - 111 mmol/L 108  105  107   CO2 22 - 32 mmol/L 22  22  23   $ Calcium 8.9 - 10.3 mg/dL 8.2  7.9  8.3   Total Protein 6.5 - 8.1 g/dL 6.0  6.1  6.3   Total Bilirubin 0.3 - 1.2 mg/dL 0.6  0.4  0.5   Alkaline Phos 38 - 126 U/L 141  135  136   AST 15 - 41 U/L 28  33  27   ALT 0 - 44 U/L 23  26  23     $ Imaging studies: No new pertinent imaging studies this AM   Assessment/Plan:  57 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   - New 02/13: No acute issues  - Wound Care ([name] Pouch); Appreciate WOC RN assistance. No longer on intermittent or continuous suction. Patient seems to like this better and has started to manage effluent independently. She will need intermittent suction with Yankauer to control effluent. Likely be beneficial to suction before ambulation.    - Continue soft diet as tolerated  - Continue daily dressing change of right chest port site; okay to cover with Band-Aid or gauze with tape  - Resumed TPN; nutritional labs pending this morning - Appreciate dietary assistance with trace elements; iron supplementation            - Monitor abdominal examination; on-going bowel function            - Pain control prn; antemetic prn            - Progressed with therapies; no longer any recommendations  - Appreciate neurosurgery assistance with  compression fracture; serial XRs   - Discharge Planning: Continue to anticipate lengthy admission +/- possible transfer; no new progress regarding disposition unfortunately   All of the above findings and recommendations were discussed with the patient, and the medical team, and all of patient's questions were answered to her expressed satisfaction.  -- [name], PA-C [facility] 10/21/2022, 7:12 AM M-F: 7am - 4pm

## 2022-10-22 LAB — GLUCOSE, CAPILLARY
Glucose-Capillary: 159 mg/dL — ABNORMAL HIGH (ref 70–99)
Glucose-Capillary: 124 mg/dL — ABNORMAL HIGH (ref 70–99)
Glucose-Capillary: 94 mg/dL (ref 70–99)

## 2022-10-22 MED ORDER — ZINC CHLORIDE 1 MG/ML IV SOLN
INTRAVENOUS | Status: AC
  Filled 2022-10-22: qty 672

## 2022-10-22 NOTE — Progress Notes (Signed)
PHARMACY - TOTAL PARENTERAL NUTRITION CONSULT NOTE   Indication: Prolonged ileus  Patient Measurements: Height: 4' 11"$  (149.9 cm) Weight: 96.1 kg (211 lb 13.8 oz) IBW/kg (Calculated) : 43.2 TPN AdjBW (KG): 55.2 Body mass index is 42.79 kg/m.  Assessment: Debra [name] is a 57 y.o. female s/p laparotomy, excision of greater omental mass, abdominal wall reconstruction with [name], appendectomy, and placement of Prevena vac.  Glucose / Insulin:  --No apparent history of diabetes --BG 109 - 159 --rSSI 4x/day --SSI: 3u last 24h  17 units insulin in TPN (decreased from 20 units on 1/21)  Electrolytes: WNL Renal: SCr stable at approximate baseline  Hepatic: LFTs wnl --bilirubin wnl GI Imaging: 9/11 CTAP: no new acute issues GI Surgeries / Procedures: s/p laparotomy, excision of greater omental mass, abdominal wall reconstruction with [name], appendectomy, and placement of Prevena vac  Central access: 02/15/22 TPN start date: 02/15/22  Nutritional Goals: Goal cyclic TPN over 16 hrs: cyclic TPN over 16 hrs: (provides 102 g of protein and 1811 kcals per day) total volume over 16hrs for calculations=1400 ml (1500 ml total with overfill)  RD Assessment:  Estimated Needs Total Energy Estimated Needs: 1800-2100kcal/day Total Protein Estimated Needs: 90-110g/d Total Fluid Estimated Needs: 1.4-1.6L/day  Current Nutrition:  Soft diet + nutritional supplements, not meeting PO needs yet   Plan: Transitioned to *Cyclic* TPN on [id number].   to run over 16 hours per MD request. [name] at 2000 per discussion with Dietician to allow patient time for walking in afternoon/evenings.  To run over 16 hours: -Start rate at 49 mL/hr for 1 hour. -Increase rate to 98 mL/hr for 13 hours.  -Decrease rate to 49 mL/hr for 1 hour. -Decrease rate to 25 mL/hr for 1 hour, then stop.  Plan:  See cyclic TPN rate above Continue Nutritional Components Amino acids (using 15%  Clinisol): 101 g Dextrose 19% = 266 g Lipids (using 20% SMOFlipids): 50.4 g kCal: 1817/24h  Electrolytes in TPN: Na 63mq/L, K 460m/L, Ca 57m37mL, Mg 10 mEq/L, Phos 10 mmol/L.  Continue ratio Cl: Acet 1:1  Resume trace elements, chromium and zinc per dietary recs. Continue CBG/SSI for Cyclic TPN:  -CBG 2 hrs after cyclic TPN start -CBG during middle of cyclic TPN infusion -CBG 1 hr after cylic TPN stopped -CBG while off TPN Continue resistant SSI  and continue insulin in TPN at 17 units (decreased from 20 units on 1/21) Check TPN labs on Mon/Thurs at minimum Zinc reduced to 2 mg added to TPN per dietician recommendations 2/8 Selenium 60 mcg daily added to TPN based on RD recommendations  [name] Clinical Pharmacist 10/22/2022 7:28 AM

## 2022-10-22 NOTE — TOC Progression Note (Signed)
Transition of Care [facility]) - Progression Note    Patient Details  Name: Debra [name] MRN: [id number] Date of Birth: 1965-10-11  [facility] CM/SW Contact  Beverly Sessions, RN Phone Number: 10/22/2022, 11:34 AM  Clinical Narrative:    Per surgery No update as far as disposition   TOC supervisor aware    Expected Discharge Plan: [place] Barriers to Discharge: Continued Medical Work up  Expected Discharge Plan and Services                                               Social Determinants of Health (SDOH) Interventions SDOH Screenings   Food Insecurity: No Food Insecurity (05/20/2022)  Housing: Low Risk  (05/20/2022)  Transportation Needs: No Transportation Needs (05/20/2022)  Utilities: Not At Risk (05/20/2022)  Tobacco Use: Low Risk  (09/12/2022)    Readmission Risk Interventions     No data to display

## 2022-10-22 NOTE — Consult Note (Signed)
          [place] nurse follow up: Large [name] in place, last changed 2/7, ok to change at 7-10 days.  Pattern in the room, open abdominal wound with stomatized fistulas Patient providing intermittent suction  Supplies in the room for bedside nursing to change if the pouch leaks. She had issues with leakage last week but this seems to have been resolved by Leron Croak with last pouch change.  [place], [place], [place]

## 2022-10-22 NOTE — Progress Notes (Signed)
[facility] SUR[facility]ICAL PROGRESS NOTE (cpt [phone])  Hospital Day(s): 251.   Post op day(s): 249 Days Post-Op.   Interval History:  Patient seen and examined No acute issues overnight  No new labs this AM [name] output 550 ccs + unmeasured; managing effluent independently On TPN; On Soft diet She is ambulating better; reviewed mobility notes   Review of Systems:  Constitutional: denies fever, chills  HEENT: denies cough or congestion  Respiratory: denies any shortness of breath  Cardiovascular: denies chest pain or palpitations  Gastrointestinal: denied abdominal pain, denies N/V Genitourinary: denies burning with urination or urinary frequency Integumentary: + midline wound (stable)   Vital signs in last 24 hours: [min-max] current  Temp:  [98.5 F (36.9 C)-98.9 F (37.2 C)] 98.7 F (37.1 C) (02/14 0514) Pulse Rate:  [73-92] 92 (02/14 0514) Resp:  [18] 18 (02/14 0514) BP: (92-109)/(50-74) 100/50 (02/14 0514) SpO2:  [98 %-100 %] 100 % (02/14 0514) Weight:  [95 kg-96.1 kg] 96.1 kg (02/14 0500)     Height: 4' 11"$  (149.9 cm) Weight: 96.1 kg BMI (Calculated): 43.04   Intake/Output last 2 shifts:  02/13 0701 - 02/14 0700 In: 2220.4 [P.O.:240; I.V.:1980.4] Out: 550 [Drains:550]   Physical Exam:  Constitutional: alert, cooperative and no distress  Respiratory: breathing non-labored at rest  Cardiovascular: regular rate and sinus rhythm  Gastrointestinal: Soft, rather unchanged suprapubic tenderness (mild), non-distended, no rebound/guarding. Integumentary:  Port removal site in right chest is healing via secondary intention; no erythema. Midline wound open, peritoneum closed; granulating; there are three stomatized areas visible in the LUQ and LLQ portions of the wound   Labs:     Latest Ref Rng & Units 10/17/2022    7:03 AM 10/16/2022   12:06 AM 10/14/2022   11:21 AM  CBC  WBC 4.0 - 10.5 K/uL 3.7  3.9  4.0   Hemoglobin 12.0 - 15.0 g/dL 8.0  7.1  7.5    Hematocrit 36.0 - 46.0 % 24.7  22.5  24.1   Platelets 150 - 400 K/uL 111  117  123       Latest Ref Rng & Units 10/20/2022    7:50 AM 10/16/2022    6:30 AM 10/13/2022    9:20 AM  CMP  Glucose 70 - 99 mg/dL 129  149  115   BUN 6 - 20 mg/dL 22  21  22   $ Creatinine 0.44 - 1.00 mg/dL 0.45  0.43  0.45   Sodium 135 - 145 mmol/L 134  133  136   Potassium 3.5 - 5.1 mmol/L 4.0  4.0  4.6   Chloride 98 - 111 mmol/L 108  105  107   CO2 22 - 32 mmol/L 22  22  23   $ Calcium 8.9 - 10.3 mg/dL 8.2  7.9  8.3   Total Protein 6.5 - 8.1 g/dL 6.0  6.1  6.3   Total Bilirubin 0.3 - 1.2 mg/dL 0.6  0.4  0.5   Alkaline Phos 38 - 126 U/L 141  135  136   AST 15 - 41 U/L 28  33  27   ALT 0 - 44 U/L 23  26  23     $ Imaging studies: No new pertinent imaging studies this AM   Assessment/Plan:  57 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   - New 02/14: No acute issues  - Wound Care ([name] Pouch); Appreciate WOC RN assistance. No longer on intermittent or continuous suction. Patient seems to like this better and has started to manage effluent independently. She will need intermittent suction with Yankauer to control effluent. Likely be beneficial to suction before ambulation.    - Continue soft diet as tolerated  - Continue daily dressing change of right chest port site; okay to cover with Band-Aid or gauze with tape  - Resumed TPN; nutritional labs pending this morning - Appreciate dietary assistance with trace elements; iron supplementation            - Monitor abdominal examination; on-going bowel function            - Pain control prn; antemetic prn            - Progressed with therapies; no longer any recommendations  - Appreciate neurosurgery  assistance with compression fracture; serial XRs   - Discharge Planning: Continue to anticipate lengthy admission +/- possible transfer; no new progress regarding disposition unfortunately   All of the above findings and recommendations were discussed with the patient, and the medical team, and all of patient's questions were answered to her expressed satisfaction.  -- [name], PA-C  [facility] 10/22/2022, 7:45 AM M-F: 7am - 4pm

## 2022-10-23 LAB — COMPREHENSIVE METABOLIC PANEL
ALT: 25 U/L (ref 0–44)
AST: 34 U/L (ref 15–41)
Albumin: 2.5 g/dL — ABNORMAL LOW (ref 3.5–5.0)
Alkaline Phosphatase: 137 U/L — ABNORMAL HIGH (ref 38–126)
Anion gap: 3 — ABNORMAL LOW (ref 5–15)
BUN: 23 mg/dL — ABNORMAL HIGH (ref 6–20)
CO2: 22 mmol/L (ref 22–32)
Calcium: 8 mg/dL — ABNORMAL LOW (ref 8.9–10.3)
Chloride: 109 mmol/L (ref 98–111)
Creatinine, Ser: 0.41 mg/dL — ABNORMAL LOW (ref 0.44–1.00)
GFR, Estimated: >60 mL/min (ref >60)
Glucose, Bld: 147 mg/dL — ABNORMAL HIGH (ref 70–99)
Potassium: 4.3 mmol/L (ref 3.5–5.1)
Sodium: 134 mmol/L — ABNORMAL LOW (ref 135–145)
Total Bilirubin: 0.6 mg/dL (ref 0.3–1.2)
Total Protein: 6.2 g/dL — ABNORMAL LOW (ref 6.5–8.1)

## 2022-10-23 LAB — CBC
HCT: 26.2 % — ABNORMAL LOW (ref 36.0–46.0)
Hemoglobin: 8.2 g/dL — ABNORMAL LOW (ref 12.0–15.0)
MCH: 28.1 pg (ref 26.0–34.0)
MCHC: 31.3 g/dL (ref 30.0–36.0)
MCV: 89.7 fL (ref 80.0–100.0)
Platelets: 123 10*3/uL — ABNORMAL LOW (ref 150–400)
RBC: 2.92 MIL/uL — ABNORMAL LOW (ref 3.87–5.11)
RDW: 18 % — ABNORMAL HIGH (ref 11.5–15.5)
WBC: 4 10*3/uL (ref 4.0–10.5)
nRBC: 0 % (ref 0.0–0.2)

## 2022-10-23 LAB — PHOSPHORUS: Phosphorus: 3.2 mg/dL (ref 2.5–4.6)

## 2022-10-23 LAB — GLUCOSE, CAPILLARY
Glucose-Capillary: 126 mg/dL — ABNORMAL HIGH (ref 70–99)
Glucose-Capillary: 162 mg/dL — ABNORMAL HIGH (ref 70–99)
Glucose-Capillary: 130 mg/dL — ABNORMAL HIGH (ref 70–99)
Glucose-Capillary: 74 mg/dL (ref 70–99)
Glucose-Capillary: 157 mg/dL — ABNORMAL HIGH (ref 70–99)

## 2022-10-23 LAB — MAGNESIUM: Magnesium: 2.1 mg/dL (ref 1.7–2.4)

## 2022-10-23 MED ORDER — ZINC CHLORIDE 1 MG/ML IV SOLN
INTRAVENOUS | Status: AC
  Filled 2022-10-23: qty 672

## 2022-10-23 NOTE — Progress Notes (Signed)
PHARMACY - TOTAL PARENTERAL NUTRITION CONSULT NOTE   Indication: Prolonged ileus  Patient Measurements: Height: 4' 11"$  (149.9 cm) Weight: 96.1 kg (211 lb 13.8 oz) IBW/kg (Calculated) : 43.2 TPN AdjBW (KG): 55.2 Body mass index is 42.79 kg/m.  Assessment: Debra [name] is a 57 y.o. female s/p laparotomy, excision of greater omental mass, abdominal wall reconstruction with Debra [name] release, appendectomy, and placement of Prevena vac.  Glucose / Insulin:  --No apparent history of diabetes --BG 162 --rSSI 4x/day --SSI: 4u last 24h  17 units insulin in TPN (decreased from 20 units on 1/21)  Electrolytes: WNL Renal: SCr stable at approximate baseline  Hepatic: LFTs wnl --bilirubin wnl GI Imaging: 9/11 CTAP: no new acute issues GI Surgeries / Procedures: s/p laparotomy, excision of greater omental mass, abdominal wall reconstruction with Debra [name] release, appendectomy, and placement of Prevena vac  Central access: 02/15/22 TPN start date: 02/15/22  Nutritional Goals: Goal cyclic TPN over 16 hrs: cyclic TPN over 16 hrs: (provides 102 g of protein and 1811 kcals per day) total volume over 16hrs for calculations=1400 ml (1500 ml total with overfill)  RD Assessment:  Estimated Needs Total Energy Estimated Needs: 1800-2100kcal/day Total Protein Estimated Needs: 90-110g/d Total Fluid Estimated Needs: 1.4-1.6L/day  Current Nutrition:  Soft diet + nutritional supplements, not meeting PO needs yet   Plan: Transitioned to *Cyclic* TPN on [id number].   to run over 16 hours per MD request. Debra [name] at 2000 per discussion with Dietician to allow patient time for walking in afternoon/evenings.  To run over 16 hours: -Start rate at 49 mL/hr for 1 hour. -Increase rate to 98 mL/hr for 13 hours.  -Decrease rate to 49 mL/hr for 1 hour. -Decrease rate to 25 mL/hr for 1 hour, then stop.  Plan:  See cyclic TPN rate above Continue Nutritional Components Amino acids (using 15%  Clinisol): 101 g Dextrose 19% = 266 g Lipids (using 20% SMOFlipids): 50.4 g kCal: 1817/24h  Electrolytes in TPN: Na 61mq/L, K 464m/L, Ca 107m36mL, Mg 10 mEq/L, Phos 10 mmol/L.  Continue ratio Cl: Acet 1:1  Resume trace elements, chromium and zinc per dietary recs. Continue CBG/SSI for Cyclic TPN:  -CBG 2 hrs after cyclic TPN start -CBG during middle of cyclic TPN infusion -CBG 1 hr after cylic TPN stopped -CBG while off TPN Continue resistant SSI  and continue insulin in TPN at 17 units (decreased from 20 units on 1/21) Check TPN labs on Mon/Thurs at minimum Zinc reduced to 2 mg added to TPN per dietician recommendations 2/8 Selenium 60 mcg daily added to TPN based on RD recommendations  KisOswald HillockharmD Clinical Pharmacist 10/23/2022 8:06 AM

## 2022-10-23 NOTE — Progress Notes (Signed)
[facility] SURGICAL PROGRESS NOTE (cpt [phone])  Hospital Day(s): 252.   Post op day(s): 250 Days Post-Op.   Interval History:  Patient seen and examined No acute issues overnight  Hgb 8.2; stable  [name] output 300 ccs + unmeasured; managing effluent independently On TPN; On Soft diet She is ambulating better; reviewed mobility notes   Review of Systems:  Constitutional: denies fever, chills  HEENT: denies cough or congestion  Respiratory: denies any shortness of breath  Cardiovascular: denies chest pain or palpitations  Gastrointestinal: denied abdominal pain, denies N/V Genitourinary: denies burning with urination or urinary frequency Integumentary: + midline wound (stable)   Vital signs in last 24 hours: [min-max] current  Temp:  [97.6 F (36.4 C)-98.7 F (37.1 C)] 97.9 F (36.6 C) (02/15 0458) Pulse Rate:  [72-80] 74 (02/15 0458) Resp:  [16-18] 18 (02/15 0458) BP: (99-103)/(54-62) 99/62 (02/15 0458) SpO2:  [95 %-100 %] 100 % (02/15 0458)     Height: 4' 11"$  (149.9 cm) Weight: 96.1 kg BMI (Calculated): 43.04   Intake/Output last 2 shifts:  02/14 0701 - 02/15 0700 In: 480 [P.O.:480] Out: 650 [Drains:300]   Physical Exam:  Constitutional: alert, cooperative and no distress  Respiratory: breathing non-labored at rest  Cardiovascular: regular rate and sinus rhythm  Gastrointestinal: Soft, rather unchanged suprapubic tenderness (mild), non-distended, no rebound/guarding. Integumentary:  Port removal site in right chest is healing via secondary intention; no erythema. Midline wound open, peritoneum closed; granulating; there are three stomatized areas visible in the LUQ and LLQ portions of the wound   Labs:     Latest Ref Rng & Units 10/23/2022   12:21 AM 10/17/2022    7:03 AM 10/16/2022   12:06 AM  CBC  WBC 4.0 - 10.5 K/uL 4.0  3.7  3.9   Hemoglobin 12.0 - 15.0 g/dL 8.2  8.0  7.1   Hematocrit 36.0 - 46.0 % 26.2  24.7  22.5   Platelets 150 - 400 K/uL  123  111  117       Latest Ref Rng & Units 10/20/2022    7:50 AM 10/16/2022    6:30 AM 10/13/2022    9:20 AM  CMP  Glucose 70 - 99 mg/dL 129  149  115   BUN 6 - 20 mg/dL 22  21  22   $ Creatinine 0.44 - 1.00 mg/dL 0.45  0.43  0.45   Sodium 135 - 145 mmol/L 134  133  136   Potassium 3.5 - 5.1 mmol/L 4.0  4.0  4.6   Chloride 98 - 111 mmol/L 108  105  107   CO2 22 - 32 mmol/L 22  22  23   $ Calcium 8.9 - 10.3 mg/dL 8.2  7.9  8.3   Total Protein 6.5 - 8.1 g/dL 6.0  6.1  6.3   Total Bilirubin 0.3 - 1.2 mg/dL 0.6  0.4  0.5   Alkaline Phos 38 - 126 U/L 141  135  136   AST 15 - 41 U/L 28  33  27   ALT 0 - 44 U/L 23  26  23     $ Imaging studies: No new pertinent imaging studies this AM   Assessment/Plan:  57 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   - New 02/15: No  acute issues; Hgb stable  - Wound Care ([name] Pouch); Appreciate WOC RN assistance. No longer on intermittent or continuous suction. Patient seems to like this better and has started to manage effluent independently. She will need intermittent suction with Yankauer to control effluent. Likely be beneficial to suction before ambulation.    - Continue soft diet as tolerated  - Continue daily dressing change of right chest port site; okay to cover with Band-Aid or gauze with tape  - Resumed TPN; nutritional labs pending this morning - Appreciate dietary assistance with trace elements; iron supplementation            - Monitor abdominal examination; on-going bowel function            - Pain control prn; antemetic prn            - Progressed with therapies; no longer any recommendations  - Appreciate neurosurgery assistance with compression fracture; serial XRs   -  Discharge Planning: Continue to anticipate lengthy admission +/- possible transfer; no new progress regarding disposition unfortunately   All of the above findings and recommendations were discussed with the patient, and the medical team, and all of patient's questions were answered to her expressed satisfaction.  -- [name], PA-C [facility] 10/23/2022, 7:09 AM M-F: 7am - 4pm

## 2022-10-24 LAB — ZINC: Zinc: 64 ug/dL (ref 44–115)

## 2022-10-24 LAB — GLUCOSE, CAPILLARY
Glucose-Capillary: 157 mg/dL — ABNORMAL HIGH (ref 70–99)
Glucose-Capillary: 150 mg/dL — ABNORMAL HIGH (ref 70–99)
Glucose-Capillary: 115 mg/dL — ABNORMAL HIGH (ref 70–99)

## 2022-10-24 LAB — VITAMIN K1, SERUM: VITAMIN K1: 1.03 ng/mL (ref 0.10–2.20)

## 2022-10-24 LAB — COPPER, SERUM: Copper: 105 ug/dL (ref 80–158)

## 2022-10-24 MED ORDER — ZINC CHLORIDE 1 MG/ML IV SOLN
INTRAVENOUS | Status: AC
  Filled 2022-10-24: qty 672

## 2022-10-24 NOTE — Progress Notes (Signed)
PHARMACY - TOTAL PARENTERAL NUTRITION CONSULT NOTE   Indication: Prolonged ileus  Patient Measurements: Height: 4' 11"$  (149.9 cm) Weight: 95.4 kg (210 lb 5.1 oz) IBW/kg (Calculated) : 43.2 TPN AdjBW (KG): 55.2 Body mass index is 42.48 kg/m.  Assessment: Debra [name] is a 57 y.o. female s/p laparotomy, excision of greater omental mass, abdominal wall reconstruction with [name], appendectomy, and placement of Prevena vac.  Glucose / Insulin:  --No apparent history of diabetes --BG 162 > 157 --rSSI 4x/day --SSI: 11u last 24h  17 units insulin in TPN (decreased from 20 units on 1/21)  Electrolytes: WNL Renal: SCr stable at approximate baseline  Hepatic: LFTs wnl --bilirubin wnl GI Imaging: 9/11 CTAP: no new acute issues GI Surgeries / Procedures: s/p laparotomy, excision of greater omental mass, abdominal wall reconstruction with [name], appendectomy, and placement of Prevena vac  Central access: 02/15/22 TPN start date: 02/15/22  Nutritional Goals: Goal cyclic TPN over 16 hrs: cyclic TPN over 16 hrs: (provides 102 g of protein and 1811 kcals per day) total volume over 16hrs for calculations=1400 ml (1500 ml total with overfill)  RD Assessment:  Estimated Needs Total Energy Estimated Needs: 1800-2100kcal/day Total Protein Estimated Needs: 90-110g/d Total Fluid Estimated Needs: 1.4-1.6L/day  Current Nutrition:  Soft diet + nutritional supplements, not meeting PO needs yet   Plan: Transitioned to *Cyclic* TPN on [id number].   to run over 16 hours per MD request. [name] at 2000 per discussion with Dietician to allow patient time for walking in afternoon/evenings.  To run over 16 hours: -Start rate at 49 mL/hr for 1 hour. -Increase rate to 98 mL/hr for 13 hours.  -Decrease rate to 49 mL/hr for 1 hour. -Decrease rate to 25 mL/hr for 1 hour, then stop.  Plan:  See cyclic TPN rate above Continue Nutritional Components Amino acids (using 15%  Clinisol): 101 g Dextrose 19% = 266 g Lipids (using 20% SMOFlipids): 50.4 g kCal: 1817/24h  Electrolytes in TPN: Na 40mq/L, K 454m/L, Ca 59m12mL, Mg 10 mEq/L, Phos 10 mmol/L.  Continue ratio Cl: Acet 1:1  Resume trace elements, chromium and zinc per dietary recs. Continue CBG/SSI for Cyclic TPN:  -CBG 2 hrs after cyclic TPN start -CBG during middle of cyclic TPN infusion -CBG 1 hr after cylic TPN stopped -CBG while off TPN Continue resistant SSI  and continue insulin in TPN at 17 units (decreased from 20 units on 1/21) Check TPN labs on Mon/Thurs at minimum Zinc reduced to 2 mg added to TPN per dietician recommendations 2/8 Selenium 60 mcg daily added to TPN based on RD recommendations  KisOswald HillockharmD Clinical Pharmacist 10/24/2022 8:58 AM

## 2022-10-24 NOTE — Progress Notes (Signed)
[facility] SURGICAL PROGRESS NOTE (cpt [phone])  Hospital Day(s): 253.   Post op day(s): 251 Days Post-Op.   Interval History:  Patient seen and examined No acute issues overnight  No new labs this morning [name] output 500 ccs + unmeasured; managing effluent independently On TPN; On Soft diet She is ambulating better; reviewed mobility notes   Review of Systems:  Constitutional: denies fever, chills  HEENT: denies cough or congestion  Respiratory: denies any shortness of breath  Cardiovascular: denies chest pain or palpitations  Gastrointestinal: denied abdominal pain, denies N/V Genitourinary: denies burning with urination or urinary frequency Integumentary: + midline wound (stable)   Vital signs in last 24 hours: [min-max] current  Temp:  [98 F (36.7 C)-98.3 F (36.8 C)] 98 F (36.7 C) (02/16 0558) Pulse Rate:  [71-87] 76 (02/16 0558) Resp:  [16-20] 18 (02/16 0558) BP: (97-122)/(55-71) 97/55 (02/16 0558) SpO2:  [98 %-100 %] 98 % (02/16 0558) Weight:  [95.4 kg] 95.4 kg (02/16 0500)     Height: 4' 11"$  (149.9 cm) Weight: 95.4 kg BMI (Calculated): 43.04   Intake/Output last 2 shifts:  02/15 0701 - 02/16 0700 In: 1028.9 [P.O.:220; I.V.:808.9] Out: 500 [Drains:500]   Physical Exam:  Constitutional: alert, cooperative and no distress  Respiratory: breathing non-labored at rest  Cardiovascular: regular rate and sinus rhythm  Gastrointestinal: Soft, rather unchanged suprapubic tenderness (mild), non-distended, no rebound/guarding. Integumentary:  Port removal site in right chest is healing via secondary intention; no erythema. Midline wound open, peritoneum closed; granulating; there are three stomatized areas visible in the LUQ and LLQ portions of the wound   Labs:     Latest Ref Rng & Units 10/23/2022   12:21 AM 10/17/2022    7:03 AM 10/16/2022   12:06 AM  CBC  WBC 4.0 - 10.5 K/uL 4.0  3.7  3.9   Hemoglobin 12.0 - 15.0 g/dL 8.2  8.0  7.1   Hematocrit  36.0 - 46.0 % 26.2  24.7  22.5   Platelets 150 - 400 K/uL 123  111  117       Latest Ref Rng & Units 10/23/2022    7:39 AM 10/20/2022    7:50 AM 10/16/2022    6:30 AM  CMP  Glucose 70 - 99 mg/dL 147  129  149   BUN 6 - 20 mg/dL 23  22  21   $ Creatinine 0.44 - 1.00 mg/dL 0.41  0.45  0.43   Sodium 135 - 145 mmol/L 134  134  133   Potassium 3.5 - 5.1 mmol/L 4.3  4.0  4.0   Chloride 98 - 111 mmol/L 109  108  105   CO2 22 - 32 mmol/L 22  22  22   $ Calcium 8.9 - 10.3 mg/dL 8.0  8.2  7.9   Total Protein 6.5 - 8.1 g/dL 6.2  6.0  6.1   Total Bilirubin 0.3 - 1.2 mg/dL 0.6  0.6  0.4   Alkaline Phos 38 - 126 U/L 137  141  135   AST 15 - 41 U/L 34  28  33   ALT 0 - 44 U/L 25  23  26     $ Imaging studies: No new pertinent imaging studies this AM   Assessment/Plan:  57 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   - New 02/16: No issues  - Wound Care ([name] Pouch); Appreciate WOC RN assistance. No longer on intermittent or continuous suction. Patient seems to like this better and has started to manage effluent independently. She will need intermittent suction with Yankauer to control effluent. Likely be beneficial to suction before ambulation.    - Continue soft diet as tolerated  - Resumed TPN; nutritional labs pending this morning - Appreciate dietary assistance             - Monitor abdominal examination; on-going bowel function            - Pain control prn; antemetic prn            - Progressed with therapies; no longer any recommendations   - Appreciate neurosurgery assistance with compression fracture; serial XRs   - Discharge Planning: Continue to anticipate lengthy admission +/- possible transfer; no new progress regarding  disposition unfortunately   All of the above findings and recommendations were discussed with the patient, and the medical team, and all of patient's questions were answered to her expressed satisfaction.  -- [name], PA-C [facility] 10/24/2022, 7:16 AM M-F: 7am - 4pm

## 2022-10-25 MED ORDER — ZINC CHLORIDE 1 MG/ML IV SOLN
INTRAVENOUS | Status: AC
  Filled 2022-10-25: qty 672

## 2022-10-25 NOTE — Progress Notes (Signed)
PHARMACY - TOTAL PARENTERAL NUTRITION CONSULT NOTE   Indication: Prolonged ileus  Patient Measurements: Height: 4' 11"$  (149.9 cm) Weight: 97 kg (213 lb 13.5 oz) IBW/kg (Calculated) : 43.2 TPN AdjBW (KG): 55.2 Body mass index is 43.19 kg/m.  Assessment: Debra [name] is a 57 y.o. female s/p laparotomy, excision of greater omental mass, abdominal wall reconstruction with [name], appendectomy, and placement of Prevena vac.  Glucose / Insulin:  --No apparent history of diabetes --BG 74-157 --rSSI 4x/day --SSI: 10u last 24h  17 units insulin in TPN (decreased from 20 units on 1/21)  Electrolytes: WNL Renal: SCr stable at approximate baseline  Hepatic: LFTs wnl --bilirubin wnl GI Imaging: 9/11 CTAP: no new acute issues GI Surgeries / Procedures: s/p laparotomy, excision of greater omental mass, abdominal wall reconstruction with [name], appendectomy, and placement of Prevena vac  Central access: 02/15/22 TPN start date: 02/15/22  Nutritional Goals: Goal cyclic TPN over 16 hrs: cyclic TPN over 16 hrs: (provides 102 g of protein and 1811 kcals per day) total volume over 16hrs for calculations=1400 ml (1500 ml total with overfill)  RD Assessment:  Estimated Needs Total Energy Estimated Needs: 1800-2100kcal/day Total Protein Estimated Needs: 90-110g/d Total Fluid Estimated Needs: 1.4-1.6L/day  Current Nutrition:  Soft diet + nutritional supplements, not meeting PO needs yet   Plan: Transitioned to *Cyclic* TPN on [id number].   to run over 16 hours per MD request. [name] at 2000 per discussion with Dietician to allow patient time for walking in afternoon/evenings.  To run over 16 hours: -Start rate at 49 mL/hr for 1 hour. -Increase rate to 98 mL/hr for 13 hours.  -Decrease rate to 49 mL/hr for 1 hour. -Decrease rate to 25 mL/hr for 1 hour, then stop.  Plan:  See cyclic TPN rate above Continue Nutritional Components Amino acids (using 15%  Clinisol): 101 g Dextrose 19% = 266 g Lipids (using 20% SMOFlipids): 50.4 g kCal: 1817/24h  Electrolytes in TPN: Na 43mq/L, K 471m/L, Ca 61m74mL, Mg 10 mEq/L, Phos 10 mmol/L.  Continue ratio Cl: Acet 1:1  Resume trace elements, chromium and zinc per dietary recs. Continue CBG/SSI for Cyclic TPN:  -CBG 2 hrs after cyclic TPN start -CBG during middle of cyclic TPN infusion -CBG 1 hr after cylic TPN stopped -CBG while off TPN Continue resistant SSI  and continue insulin in TPN at 17 units (decreased from 20 units on 1/21) Check TPN labs on Mon/Thurs at minimum Zinc increased  to 5 mg in TPN per dietician recommendations 2/17 Selenium 60 mcg daily added to TPN based on RD recommendations  MerNoralee SpaceharmD Clinical Pharmacist 10/25/2022 9:53 AM

## 2022-10-25 NOTE — Progress Notes (Signed)
[facility] SUR[facility]ICAL PROGRESS NOTE (cpt [phone])  Hospital Day(s): 254.   Post op day(s): 252 Days Post-Op.   Interval History:  Patient seen and examined No acute issues overnight  No new labs this morning [name] output 500 ccs + unmeasured; managing effluent independently On TPN; On Soft diet   Vital signs in last 24 hours: [min-max] current  Temp:  [97.7 F (36.5 C)-99.5 F (37.5 C)] 98.6 F (37 C) (02/17 0817) Pulse Rate:  [85-94] 94 (02/17 0817) Resp:  [16-20] 18 (02/17 0817) BP: (100-113)/(53-63) 100/58 (02/17 0817) SpO2:  [98 %-100 %] 98 % (02/17 0817) Weight:  [97 kg] 97 kg (02/17 0434)     Height: 4' 11"$  (149.9 cm) Weight: 97 kg BMI (Calculated): 43.04   Intake/Output last 2 shifts:  02/16 0701 - 02/17 0700 In: 374.5 [I.V.:374.5] Out: 500 [Drains:500]   Physical Exam:  Constitutional: alert, cooperative and no distress  Respiratory: breathing non-labored at rest  Cardiovascular: regular rate and sinus rhythm  Gastrointestinal: Soft, rather unchanged suprapubic tenderness (mild), non-distended, no rebound/guarding. Integumentary:  Port removal site in right chest is healing via secondary intention; no erythema. Midline wound open, peritoneum closed; granulating; there are three stomatized areas visible in the LUQ and LLQ portions of the wound.  Dark purple fluid fills dependent portion of bag, no BRB.    Labs:     Latest Ref Rng & Units 10/23/2022   12:21 AM 10/17/2022    7:03 AM 10/16/2022   12:06 AM  CBC  WBC 4.0 - 10.5 K/uL 4.0  3.7  3.9   Hemoglobin 12.0 - 15.0 g/dL 8.2  8.0  7.1   Hematocrit 36.0 - 46.0 % 26.2  24.7  22.5   Platelets 150 - 400 K/uL 123  111  117       Latest Ref Rng & Units 10/23/2022    7:39 AM 10/20/2022    7:50 AM 10/16/2022    6:30 AM  CMP  Glucose 70 - 99 mg/dL 147  129  149   BUN 6 - 20 mg/dL 23  22  21   $ Creatinine 0.44 - 1.00 mg/dL 0.41  0.45  0.43   Sodium 135 - 145 mmol/L 134  134  133   Potassium 3.5 - 5.1  mmol/L 4.3  4.0  4.0   Chloride 98 - 111 mmol/L 109  108  105   CO2 22 - 32 mmol/L 22  22  22   $ Calcium 8.9 - 10.3 mg/dL 8.0  8.2  7.9   Total Protein 6.5 - 8.1 g/dL 6.2  6.0  6.1   Total Bilirubin 0.3 - 1.2 mg/dL 0.6  0.6  0.4   Alkaline Phos 38 - 126 U/L 137  141  135   AST 15 - 41 U/L 34  28  33   ALT 0 - 44 U/L 25  23  26     $ Imaging studies: No new pertinent imaging studies this AM   Assessment/Plan:  57 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   - New 02/17: monitor output.   - Wound Care ([name] Pouch); Appreciate WOC RN assistance. No longer on intermittent or continuous suction. Patient seems to like this better and has started to manage effluent independently. She will need intermittent  suction with Yankauer to control effluent. Likely be beneficial to suction before ambulation.    - Continue soft diet as tolerated  - Resumed TPN; nutritional labs pending this morning - Appreciate dietary assistance             - Monitor abdominal examination; on-going bowel function            - Pain control prn; antemetic prn            - Progressed with therapies; no longer any recommendations   - Appreciate neurosurgery assistance with compression fracture; serial XRs   - Discharge Planning: Continue to anticipate lengthy admission +/- possible transfer; no new progress regarding disposition unfortunately   All of the above findings and recommendations were discussed with the patient, and the medical team, and all of patient's questions were answered to her expressed satisfaction.  -- Ronny Bacon, M.D., [facility]  10/25/2022 ; 11:23 AM

## 2022-10-26 LAB — GLUCOSE, CAPILLARY: Glucose-Capillary: 138 mg/dL — ABNORMAL HIGH (ref 70–99)

## 2022-10-26 LAB — VITAMIN B6: Vitamin B6: 21.9 ug/L (ref 3.4–65.2)

## 2022-10-26 MED ORDER — ZINC CHLORIDE 1 MG/ML IV SOLN
INTRAVENOUS | Status: AC
  Filled 2022-10-26: qty 672

## 2022-10-26 NOTE — Progress Notes (Signed)
PHARMACY - TOTAL PARENTERAL NUTRITION CONSULT NOTE   Indication: Prolonged ileus  Patient Measurements: Height: 4' 11"$  (149.9 cm) Weight: 95.9 kg (211 lb 6.7 oz) IBW/kg (Calculated) : 43.2 TPN AdjBW (KG): 55.2 Body mass index is 42.7 kg/m.  Assessment: Debra [name] is a 57 y.o. female s/p laparotomy, excision of greater omental mass, abdominal wall reconstruction with [name], appendectomy, and placement of Prevena vac.  Glucose / Insulin:  --No apparent history of diabetes --BG per MAR 96-165 (glucometer check) --rSSI 4x/day --SSI: 10u last 24h  17 units insulin in TPN (decreased from 20 units on 1/21)  Electrolytes: WNL Renal: SCr stable at approximate baseline  Hepatic: LFTs wnl --bilirubin wnl GI Imaging: 9/11 CTAP: no new acute issues GI Surgeries / Procedures: s/p laparotomy, excision of greater omental mass, abdominal wall reconstruction with [name], appendectomy, and placement of Prevena vac  Central access: 02/15/22 TPN start date: 02/15/22  Nutritional Goals: Goal cyclic TPN over 16 hrs: cyclic TPN over 16 hrs: (provides 102 g of protein and 1811 kcals per day) total volume over 16hrs for calculations=1400 ml (1500 ml total with overfill)  RD Assessment:  Estimated Needs Total Energy Estimated Needs: 1800-2100kcal/day Total Protein Estimated Needs: 90-110g/d Total Fluid Estimated Needs: 1.4-1.6L/day  Current Nutrition:  Soft diet + nutritional supplements, not meeting PO needs yet   Plan: Transitioned to *Cyclic* TPN on [id number].   to run over 16 hours per MD request. [name] at 2000 per discussion with Dietician to allow patient time for walking in afternoon/evenings.  To run over 16 hours: -Start rate at 49 mL/hr for 1 hour. -Increase rate to 98 mL/hr for 13 hours.  -Decrease rate to 49 mL/hr for 1 hour. -Decrease rate to 25 mL/hr for 1 hour, then stop.  Plan:  See cyclic TPN rate above Continue Nutritional  Components Amino acids (using 15% Clinisol): 101 g Dextrose 19% = 266 g Lipids (using 20% SMOFlipids): 50.4 g kCal: 1817/24h  Electrolytes in TPN: Na 89mq/L, K 449m/L, Ca 62m58mL, Mg 10 mEq/L, Phos 10 mmol/L.  Continue ratio Cl: Acet 1:1  Resume trace elements, chromium and zinc per dietary recs. Continue CBG/SSI for Cyclic TPN:  -CBG 2 hrs after cyclic TPN start -CBG during middle of cyclic TPN infusion -CBG 1 hr after cylic TPN stopped -CBG while off TPN Continue resistant SSI  and continue insulin in TPN at 17 units (decreased from 20 units on 1/21) Check TPN labs on Mon/Thurs at minimum Zinc increased to 5 mg in TPN per dietician recommendations 2/17 Selenium 60 mcg daily added to TPN based on RD recommendations  MerNoralee SpaceharmD Clinical Pharmacist 10/26/2022 10:02 AM

## 2022-10-26 NOTE — Progress Notes (Signed)
[facility] SURGICAL PROGRESS NOTE (cpt [phone])  Hospital Day(s): 255.   Post op day(s): 253 Days Post-Op.   Interval History:  Patient seen and examined, ambulating well with 3 laps already.  No acute issues overnight  No new labs this morning [name] output 300 ccs + unmeasured; managing effluent independently On TPN; On Soft diet   Vital signs in last 24 hours: [min-max] current  Temp:  [97.7 F (36.5 C)-98.5 F (36.9 C)] 98.5 F (36.9 C) (02/18 0845) Pulse Rate:  [73-90] 85 (02/18 0845) Resp:  [16-20] 18 (02/18 0845) BP: (94-107)/(43-68) 104/68 (02/18 0845) SpO2:  [98 %-100 %] 100 % (02/18 0845) Weight:  [95.9 kg] 95.9 kg (02/18 0420)     Height: 4' 11"$  (149.9 cm) Weight: 95.9 kg BMI (Calculated): 43.04   Intake/Output last 2 shifts:  02/17 0701 - 02/18 0700 In: 782.9 [I.V.:782.9] Out: 300 [Stool:300]   Physical Exam:  Constitutional: alert, cooperative and no distress  Respiratory: breathing non-labored at rest  Cardiovascular: regular rate and sinus rhythm  Gastrointestinal: Soft, rather unchanged suprapubic tenderness (mild), non-distended, no rebound/guarding. Integumentary:  Port removal site in right chest is healing via secondary intention; no erythema. Midline wound open, peritoneum closed; granulating; there are three stomatized areas visible in the LUQ and LLQ portions of the wound.    Labs:     Latest Ref Rng & Units 10/23/2022   12:21 AM 10/17/2022    7:03 AM 10/16/2022   12:06 AM  CBC  WBC 4.0 - 10.5 K/uL 4.0  3.7  3.9   Hemoglobin 12.0 - 15.0 g/dL 8.2  8.0  7.1   Hematocrit 36.0 - 46.0 % 26.2  24.7  22.5   Platelets 150 - 400 K/uL 123  111  117       Latest Ref Rng & Units 10/23/2022    7:39 AM 10/20/2022    7:50 AM 10/16/2022    6:30 AM  CMP  Glucose 70 - 99 mg/dL 147  129  149   BUN 6 - 20 mg/dL 23  22  21   $ Creatinine 0.44 - 1.00 mg/dL 0.41  0.45  0.43   Sodium 135 - 145 mmol/L 134  134  133   Potassium 3.5 - 5.1 mmol/L 4.3  4.0   4.0   Chloride 98 - 111 mmol/L 109  108  105   CO2 22 - 32 mmol/L 22  22  22   $ Calcium 8.9 - 10.3 mg/dL 8.0  8.2  7.9   Total Protein 6.5 - 8.1 g/dL 6.2  6.0  6.1   Total Bilirubin 0.3 - 1.2 mg/dL 0.6  0.6  0.4   Alkaline Phos 38 - 126 U/L 137  141  135   AST 15 - 41 U/L 34  28  33   ALT 0 - 44 U/L 25  23  26     $ Imaging studies: No new pertinent imaging studies this AM   Assessment/Plan:  57 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   - New 02/18: no new issues.   - Wound Care ([name] Pouch); Appreciate WOC RN assistance. No longer on intermittent or continuous suction. Patient seems to like this better and has started to manage effluent independently. She will need intermittent suction with Yankauer  to control effluent. Likely be beneficial to suction before ambulation.    - Continue soft diet as tolerated  - Resumed TPN; nutritional labs pending this morning - Appreciate dietary assistance             - Monitor abdominal examination; on-going bowel function            - Pain control prn; antemetic prn            - Progressed with therapies; no longer any recommendations   - Appreciate neurosurgery assistance with compression fracture; serial XRs   - Discharge Planning: Continue to anticipate lengthy admission +/- possible transfer; no new progress regarding disposition unfortunately   All of the above findings and recommendations were discussed with the patient, and the medical team, and all of patient's questions were answered to her expressed satisfaction.  -- Ronny Bacon, M.D., [facility]  10/26/2022 ; 12:30 PM

## 2022-10-27 LAB — GLUCOSE, CAPILLARY
Glucose-Capillary: 171 mg/dL — ABNORMAL HIGH (ref 70–99)
Glucose-Capillary: 146 mg/dL — ABNORMAL HIGH (ref 70–99)
Glucose-Capillary: 137 mg/dL — ABNORMAL HIGH (ref 70–99)
Glucose-Capillary: 96 mg/dL (ref 70–99)
Glucose-Capillary: 150 mg/dL — ABNORMAL HIGH (ref 70–99)
Glucose-Capillary: 165 mg/dL — ABNORMAL HIGH (ref 70–99)
Glucose-Capillary: 109 mg/dL — ABNORMAL HIGH (ref 70–99)
Glucose-Capillary: 175 mg/dL — ABNORMAL HIGH (ref 70–99)
Glucose-Capillary: 163 mg/dL — ABNORMAL HIGH (ref 70–99)
Glucose-Capillary: 132 mg/dL — ABNORMAL HIGH (ref 70–99)
Glucose-Capillary: 113 mg/dL — ABNORMAL HIGH (ref 70–99)

## 2022-10-27 LAB — COMPREHENSIVE METABOLIC PANEL
ALT: 33 U/L (ref 0–44)
AST: 38 U/L (ref 15–41)
Albumin: 2.5 g/dL — ABNORMAL LOW (ref 3.5–5.0)
Alkaline Phosphatase: 143 U/L — ABNORMAL HIGH (ref 38–126)
Anion gap: 5 (ref 5–15)
BUN: 23 mg/dL — ABNORMAL HIGH (ref 6–20)
CO2: 24 mmol/L (ref 22–32)
Calcium: 8.2 mg/dL — ABNORMAL LOW (ref 8.9–10.3)
Chloride: 106 mmol/L (ref 98–111)
Creatinine, Ser: 0.38 mg/dL — ABNORMAL LOW (ref 0.44–1.00)
GFR, Estimated: >60 mL/min (ref >60)
Glucose, Bld: 119 mg/dL — ABNORMAL HIGH (ref 70–99)
Potassium: 4.5 mmol/L (ref 3.5–5.1)
Sodium: 135 mmol/L (ref 135–145)
Total Bilirubin: 0.4 mg/dL (ref 0.3–1.2)
Total Protein: 6 g/dL — ABNORMAL LOW (ref 6.5–8.1)

## 2022-10-27 LAB — MAGNESIUM: Magnesium: 2.1 mg/dL (ref 1.7–2.4)

## 2022-10-27 LAB — PHOSPHORUS: Phosphorus: 3.2 mg/dL (ref 2.5–4.6)

## 2022-10-27 MED ORDER — ZINC CHLORIDE 1 MG/ML IV SOLN
INTRAVENOUS | Status: AC
  Filled 2022-10-27: qty 672

## 2022-10-27 NOTE — Progress Notes (Signed)
[facility] SURGICAL PROGRESS NOTE (cpt [phone])  Hospital Day(s): 256.   Post op day(s): 254 Days Post-Op.   Interval History:  Patient seen and examined No acute issues overnight  Bi-weekly nutritional labs remain reassuring [name] output 500 ccs + unmeasured; managing effluent independently On TPN; On Soft diet She is ambulating better; reviewed mobility notes   Review of Systems:  Constitutional: denies fever, chills  HEENT: denies cough or congestion  Respiratory: denies any shortness of breath  Cardiovascular: denies chest pain or palpitations  Gastrointestinal: denied abdominal pain, denies N/V Genitourinary: denies burning with urination or urinary frequency Integumentary: + midline wound (stable)   Vital signs in last 24 hours: [min-max] current  Temp:  [97.6 F (36.4 C)-98.5 F (36.9 C)] 97.8 F (36.6 C) (02/19 0617) Pulse Rate:  [73-85] 76 (02/19 0617) Resp:  [18-20] 18 (02/19 0617) BP: (98-104)/(56-68) 102/57 (02/19 0617) SpO2:  [99 %-100 %] 99 % (02/19 0617) Weight:  [96.2 kg] 96.2 kg (02/19 0617)     Height: 4' 11"$  (149.9 cm) Weight: 96.2 kg BMI (Calculated): 43.04   Intake/Output last 2 shifts:  02/18 0701 - 02/19 0700 In: [id number] [P.O.:120; I.V.:661] Out: 450 [Drains:450]   Physical Exam:  Constitutional: alert, cooperative and no distress  Respiratory: breathing non-labored at rest  Cardiovascular: regular rate and sinus rhythm  Gastrointestinal: Soft, rather unchanged suprapubic tenderness (mild), non-distended, no rebound/guarding. Integumentary:  Port removal site in right chest is healing via secondary intention; no erythema. Midline wound open, peritoneum closed; granulating; there are three stomatized areas visible in the LUQ and LLQ portions of the wound   Labs:     Latest Ref Rng & Units 10/23/2022   12:21 AM 10/17/2022    7:03 AM 10/16/2022   12:06 AM  CBC  WBC 4.0 - 10.5 K/uL 4.0  3.7  3.9   Hemoglobin 12.0 - 15.0 g/dL 8.2  8.0   7.1   Hematocrit 36.0 - 46.0 % 26.2  24.7  22.5   Platelets 150 - 400 K/uL 123  111  117       Latest Ref Rng & Units 10/27/2022    6:20 AM 10/23/2022    7:39 AM 10/20/2022    7:50 AM  CMP  Glucose 70 - 99 mg/dL 119  147  129   BUN 6 - 20 mg/dL 23  23  22   $ Creatinine 0.44 - 1.00 mg/dL 0.38  0.41  0.45   Sodium 135 - 145 mmol/L 135  134  134   Potassium 3.5 - 5.1 mmol/L 4.5  4.3  4.0   Chloride 98 - 111 mmol/L 106  109  108   CO2 22 - 32 mmol/L 24  22  22   $ Calcium 8.9 - 10.3 mg/dL 8.2  8.0  8.2   Total Protein 6.5 - 8.1 g/dL 6.0  6.2  6.0   Total Bilirubin 0.3 - 1.2 mg/dL 0.4  0.6  0.6   Alkaline Phos 38 - 126 U/L 143  137  141   AST 15 - 41 U/L 38  34  28   ALT 0 - 44 U/L 33  25  23     Imaging studies: No new pertinent imaging studies this AM   Assessment/Plan:  56 y.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   - New 02/19: No issues  - Wound Care ([name] Pouch); Appreciate WOC RN assistance. No longer on intermittent or continuous suction. Patient seems to like this better and has started to manage effluent independently. She will need intermittent suction with Yankauer to control effluent. Likely be beneficial to suction before ambulation.    - Continue soft diet as tolerated  - Resumed TPN; nutritional labs pending this morning - Appreciate dietary assistance             - Monitor abdominal examination; on-going bowel function            - Pain control prn; antemetic prn            - Progressed with therapies; no longer any recommendations   - Appreciate neurosurgery assistance with compression fracture; serial XRs   - Discharge Planning: Continue to anticipate lengthy admission +/- possible transfer; no new  progress regarding disposition unfortunately   All of the above findings and recommendations were discussed with the patient, and the medical team, and all of patient's questions were answered to her expressed satisfaction.  -- [name], PA-C [facility] 10/27/2022, 7:21 AM M-F: 7am - 4pm

## 2022-10-27 NOTE — Progress Notes (Signed)
PHARMACY - TOTAL PARENTERAL NUTRITION CONSULT NOTE   Indication: Prolonged ileus  Patient Measurements: Height: 4' 11"$  (149.9 cm) Weight: 96.2 kg (212 lb 1.3 oz) IBW/kg (Calculated) : 43.2 TPN AdjBW (KG): 55.2 Body mass index is 42.84 kg/m.  Assessment: Debra [name] is a 57 y.o. female s/p laparotomy, excision of greater omental mass, abdominal wall reconstruction with [name], appendectomy, and placement of Prevena vac.  Glucose / Insulin:  --No apparent history of diabetes --BG 109-165 --rSSI 4x/day --SSI: 8u last 24h  17 units insulin in TPN (decreased from 20 units on 1/21)  Electrolytes: WNL Renal: SCr stable at approximate baseline  Hepatic: LFTs wnl --bilirubin wnl GI Imaging: 9/11 CTAP: no new acute issues GI Surgeries / Procedures: s/p laparotomy, excision of greater omental mass, abdominal wall reconstruction with [name], appendectomy, and placement of Prevena vac  Central access: 02/15/22 TPN start date: 02/15/22  Nutritional Goals: Goal cyclic TPN over 16 hrs: cyclic TPN over 16 hrs: (provides 102 g of protein and 1811 kcals per day) total volume over 16hrs for calculations=1400 ml (1500 ml total with overfill)  RD Assessment:  Estimated Needs Total Energy Estimated Needs: 1800-2100kcal/day Total Protein Estimated Needs: 90-110g/d Total Fluid Estimated Needs: 1.4-1.6L/day  Current Nutrition:  Soft diet + nutritional supplements, not meeting PO needs yet   Plan: Transitioned to *Cyclic* TPN on [id number].   to run over 16 hours per MD request. [name] at 2000 per discussion with Dietician to allow patient time for walking in afternoon/evenings.  To run over 16 hours: -Start rate at 49 mL/hr for 1 hour. -Increase rate to 98 mL/hr for 13 hours.  -Decrease rate to 49 mL/hr for 1 hour. -Decrease rate to 25 mL/hr for 1 hour, then stop.  Plan:  See cyclic TPN rate above Continue Nutritional Components Amino acids (using 15%  Clinisol): 101 g Dextrose 19% = 266 g Lipids (using 20% SMOFlipids): 50.4 g kCal: 1817/24h  Electrolytes in TPN: Na 68mq/L, K 433m/L, Ca 59m23mL, Mg 10 mEq/L, Phos 10 mmol/L.  Continue ratio Cl: Acet 1:1  Resume trace elements, chromium and zinc per dietary recs. Continue CBG/SSI for Cyclic TPN:  -CBG 2 hrs after cyclic TPN start -CBG during middle of cyclic TPN infusion -CBG 1 hr after cylic TPN stopped -CBG while off TPN Continue resistant SSI  and continue insulin in TPN at 17 units (decreased from 20 units on 1/21) Check TPN labs on Mon/Thurs at minimum Zinc increased to 5 mg in TPN per dietician recommendations 2/17 Selenium 60 mcg daily added to TPN based on RD recommendations  MerNoralee SpaceharmD Clinical Pharmacist 10/27/2022 8:41 AM

## 2022-10-27 NOTE — TOC Progression Note (Signed)
Transition of Care [facility]) - Progression Note    Patient Details  Name: Debra [name] MRN: [id number] Date of Birth: 1966-08-12  [facility]) CM/SW Contact  Beverly Sessions, RN Phone Number: 10/27/2022, 4:16 PM  Clinical Narrative:    Per surgery No update as far as disposition   TOC supervisor aware      Expected Discharge Plan: [place] Barriers to Discharge: Continued Medical Work up  Expected Discharge Plan and Services                                               Social Determinants of Health (SDOH) Interventions SDOH Screenings   Food Insecurity: No Food Insecurity (05/20/2022)  Housing: Low Risk  (05/20/2022)  Transportation Needs: No Transportation Needs (05/20/2022)  Utilities: Not At Risk (05/20/2022)  Tobacco Use: Low Risk  (09/12/2022)    Readmission Risk Interventions     No data to display

## 2022-10-28 LAB — GLUCOSE, CAPILLARY
Glucose-Capillary: 142 mg/dL — ABNORMAL HIGH (ref 70–99)
Glucose-Capillary: 109 mg/dL — ABNORMAL HIGH (ref 70–99)
Glucose-Capillary: 112 mg/dL — ABNORMAL HIGH (ref 70–99)
Glucose-Capillary: 133 mg/dL — ABNORMAL HIGH (ref 70–99)

## 2022-10-28 MED ORDER — ZINC CHLORIDE 1 MG/ML IV SOLN
INTRAVENOUS | Status: AC
  Filled 2022-10-28: qty 672

## 2022-10-28 NOTE — Progress Notes (Signed)
PHARMACY - TOTAL PARENTERAL NUTRITION CONSULT NOTE   Indication: Prolonged ileus  Patient Measurements: Height: 4' 11"$  (149.9 cm) Weight: 96.2 kg (212 lb 1.3 oz) IBW/kg (Calculated) : 43.2 TPN AdjBW (KG): 55.2 Body mass index is 42.84 kg/m.  Assessment: Debra [name] is a 57 y.o. female s/p laparotomy, excision of greater omental mass, abdominal wall reconstruction with [name], appendectomy, and placement of Prevena vac.  Glucose / Insulin:  --No apparent history of diabetes --BG 113 - 142 --rSSI 4x/day --SSI: 6u last 24h  17 units insulin in TPN (decreased from 20 units on 1/21)  Electrolytes: WNL Renal: SCr stable at approximate baseline  Hepatic: LFTs wnl --bilirubin wnl GI Imaging: 9/11 CTAP: no new acute issues GI Surgeries / Procedures: s/p laparotomy, excision of greater omental mass, abdominal wall reconstruction with [name], appendectomy, and placement of Prevena vac  Central access: 02/15/22 TPN start date: 02/15/22  Nutritional Goals: Goal cyclic TPN over 16 hrs: cyclic TPN over 16 hrs: (provides 102 g of protein and 1811 kcals per day) total volume over 16hrs for calculations=1400 ml (1500 ml total with overfill)  RD Assessment:  Estimated Needs Total Energy Estimated Needs: 1800-2100kcal/day Total Protein Estimated Needs: 90-110g/d Total Fluid Estimated Needs: 1.4-1.6L/day  Current Nutrition:  Soft diet + nutritional supplements, not meeting PO needs yet   Plan: Transitioned to *Cyclic* TPN on [id number].   to run over 16 hours per MD request. [name] at 2000 per discussion with Dietician to allow patient time for walking in afternoon/evenings.  To run over 16 hours: -Start rate at 49 mL/hr for 1 hour. -Increase rate to 98 mL/hr for 13 hours.  -Decrease rate to 49 mL/hr for 1 hour. -Decrease rate to 25 mL/hr for 1 hour, then stop.  Plan:  See cyclic TPN rate above Continue Nutritional Components Amino acids (using 15%  Clinisol): 101 g Dextrose 19% = 266 g Lipids (using 20% SMOFlipids): 50.4 g kCal: 1817/24h  Electrolytes in TPN: Na 13mq/L, K 469m/L, Ca 88m28mL, Mg 10 mEq/L, Phos 10 mmol/L.  Continue ratio Cl: Acet 1:1  Resume trace elements, chromium and zinc per dietary recs. Continue CBG/SSI for Cyclic TPN:  -CBG 2 hrs after cyclic TPN start -CBG during middle of cyclic TPN infusion -CBG 1 hr after cylic TPN stopped -CBG while off TPN Continue resistant SSI  and continue insulin in TPN at 17 units (decreased from 20 units on 1/21) Check TPN labs on Mon/Thurs at minimum Zinc increased to 5 mg in TPN per dietician recommendations 2/17 Selenium 60 mcg daily added to TPN based on RD recommendations  [name] Clinical Pharmacist 10/28/2022 8:00 AM

## 2022-10-28 NOTE — Progress Notes (Signed)
[facility] SURGICAL PROGRESS NOTE (cpt [phone])  Hospital Day(s): 257.   Post op day(s): 255 Days Post-Op.   Interval History:  Patient seen and examined No acute issues overnight  No new labs this morning  [name] output 800 ccs + unmeasured; managing effluent independently On TPN; On Soft diet She is ambulating better; reviewed mobility notes   Review of Systems:  Constitutional: denies fever, chills  HEENT: denies cough or congestion  Respiratory: denies any shortness of breath  Cardiovascular: denies chest pain or palpitations  Gastrointestinal: denied abdominal pain, denies N/V Genitourinary: denies burning with urination or urinary frequency Integumentary: + midline wound (stable)   Vital signs in last 24 hours: [min-max] current  Temp:  [97.8 F (36.6 C)-98.2 F (36.8 C)] 98.2 F (36.8 C) (02/20 0523) Pulse Rate:  [72-86] 86 (02/20 0523) Resp:  [18-20] 20 (02/20 0523) BP: (104-111)/(53-67) 111/67 (02/20 0523) SpO2:  [100 %] 100 % (02/20 0523)     Height: 4' 11"$  (149.9 cm) Weight: 96.2 kg BMI (Calculated): 43.04   Intake/Output last 2 shifts:  02/19 0701 - 02/20 0700 In: 847.2 [I.V.:847.2] Out: 1150 [Drains:800; Stool:350]   Physical Exam:  Constitutional: alert, cooperative and no distress  Respiratory: breathing non-labored at rest  Cardiovascular: regular rate and sinus rhythm  Gastrointestinal: Soft, rather unchanged suprapubic tenderness (mild), non-distended, no rebound/guarding. Integumentary:  Port removal site in right chest is healing via secondary intention; no erythema. Midline wound open, peritoneum closed; granulating; there are three stomatized areas visible in the LUQ and LLQ portions of the wound   Labs:     Latest Ref Rng & Units 10/23/2022   12:21 AM 10/17/2022    7:03 AM 10/16/2022   12:06 AM  CBC  WBC 4.0 - 10.5 K/uL 4.0  3.7  3.9   Hemoglobin 12.0 - 15.0 g/dL 8.2  8.0  7.1   Hematocrit 36.0 - 46.0 % 26.2  24.7  22.5    Platelets 150 - 400 K/uL 123  111  117       Latest Ref Rng & Units 10/27/2022    6:20 AM 10/23/2022    7:39 AM 10/20/2022    7:50 AM  CMP  Glucose 70 - 99 mg/dL 119  147  129   BUN 6 - 20 mg/dL 23  23  22   $ Creatinine 0.44 - 1.00 mg/dL 0.38  0.41  0.45   Sodium 135 - 145 mmol/L 135  134  134   Potassium 3.5 - 5.1 mmol/L 4.5  4.3  4.0   Chloride 98 - 111 mmol/L 106  109  108   CO2 22 - 32 mmol/L 24  22  22   $ Calcium 8.9 - 10.3 mg/dL 8.2  8.0  8.2   Total Protein 6.5 - 8.1 g/dL 6.0  6.2  6.0   Total Bilirubin 0.3 - 1.2 mg/dL 0.4  0.6  0.6   Alkaline Phos 38 - 126 U/L 143  137  141   AST 15 - 41 U/L 38  34  28   ALT 0 - 44 U/L 33  25  23     Imaging studies: No new pertinent imaging studies this AM   Assessment/Plan:  57 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   - New  02/20: Nothing acute  - Wound Care ([name] Pouch); Appreciate WOC RN assistance. No longer on intermittent or continuous suction. Patient seems to like this better and has started to manage effluent independently. She will need intermittent suction with Yankauer to control effluent. Likely be beneficial to suction before ambulation.    - Continue soft diet as tolerated  - Continue cyclic TPN; bi-weekly nutritional labs - Appreciate dietary assistance             - Monitor abdominal examination; on-going bowel function            - Pain control prn; antemetic prn            - Progressed with therapies; no longer any recommendations; ambulating well    - Appreciate neurosurgery assistance with compression fracture; serial XRs   - Discharge Planning: Continue to anticipate lengthy admission +/- possible transfer; no new progress regarding disposition  unfortunately   All of the above findings and recommendations were discussed with the patient, and the medical team, and all of patient's questions were answered to her expressed satisfaction.  -- [name], PA-C [facility] 10/28/2022, 7:07 AM M-F: 7am - 4pm

## 2022-10-28 NOTE — Plan of Care (Signed)
  Problem: Clinical Measurements: Goal: Will remain free from infection Outcome: Progressing   Problem: Nutrition: Goal: Adequate nutrition will be maintained Outcome: Progressing   Problem: Activity: Goal: Risk for activity intolerance will decrease Outcome: Progressing   Problem: Nutrition: Goal: Adequate nutrition will be maintained Outcome: Progressing   Problem: Coping: Goal: Level of anxiety will decrease Outcome: Progressing   Problem: Pain Managment: Goal: General experience of comfort will improve Outcome: Progressing

## 2022-10-28 NOTE — Progress Notes (Signed)
Nutrition Follow-up  DOCUMENTATION CODES:   Obesity unspecified  INTERVENTION:   Continue cyclic TPN per pharmacy (16 hrs)- provides 1811kcal/day and 102g/day protein   Ensure Enlive po BID, each supplement provides 350 kcal and 20 grams of protein.  MVI, chromium and trace elements in TPN   Triglycerides checked monthly   Daily weights   Vitamin D2- 50,000 units po weekly   Zinc additional 15m daily added to TPN   Selenium additional 650m daily added to TPN   Vitamin labs next due 4/9  NUTRITION DIAGNOSIS:   Increased nutrient needs related to wound healing, catabolic illness as evidenced by estimated needs. -ongoing   GOAL:   Patient will meet greater than or equal to 90% of their needs -met with TPN   MONITOR:   PO intake, Supplement acceptance, Labs, Weight trends, Diet advancement, I & O's, TPN  ASSESSMENT:   [age]/o female with h/o hypothyroidism, COVID 19 (3/21), kidney stones and stage 3 colon cancer (s/p left hemicolectomy 5/21 and chemotherapy) who is admitted for new pelvic mass now s/p laparotomy 6/8 (with excision of pelvic mass from greater omentum, abdominal wall reconstruction with bilateral myocutaneous flaps and mesh, incisional hernia repair, appendectomy repair and VAC placement) complicated by bowel perforation s/p reopening of recent laparotomy 6/10 (with repair of small bowel perforation, excision of mesh, placement of two phasix mesh and VAC placement). Pathology returned as metastatic adenocarcinoma. Pt with L1 compression fracture.   Pt continues to tolerate TPN well at goal rate; TPN is currently being cycled for 16 hrs. Triglycerides wnl and are being checked monthly. Hyperglycemia improved with insulin in TPN. Volume status improved; pt up ~15lbs since admission. Will check routine vitamin labs every two months as pt on chronic TPN (next due 4/9). Pt continues to have poor oral intake; pt eating <25% of meals. Pt on soft diet. [name] pouch with  [id number]. No discharge plan in place.   Medications reviewed and include: D2, insulin, synthroid, protonix, TPN  -Selenium 75(L), Chromium- 2.4(H), copper- 105 wnl, Manganese- pending, zinc- 64 wnl, vitamin D 34.2 wnl, Iodine 74.2 wnl- 2/8 -Iron 23(L), TIBC 364, ferritin 17 wnl, transferrin 261 wnl, folate 14.5 wnl, B12 585- 2/8 -Vitamin B1- 75.8, vitamin A- 27.1, B6- 21.9 wnl, vitamin C- 0.7, vitamin E- 11.6 wnl, vitamin K- 1.03 wnl- 2/8  Labs reviewed: Na 135 wnl, K 4.5 wnl, BUN 23(H), creat 0.38(L),  P 3.2 wnl, Mg 2.1 wnl- 2/19 Triglycerides- 90- 2/5 Hgb 8.2(L), Hct 26.2(L)- 2/15 Cbgs- 109, 142, 113, 132, 163 x 48 hrs  Diet Order:    Diet Order             DIET SOFT Room service appropriate? Yes; Fluid consistency: Thin  Diet effective now                  EDUCATION NEEDS:   Not appropriate for education at this time  Skin:  Skin Assessment: Reviewed RN Assessment ( Midline wound: 11cm x 12cm )  Last BM:  2/19- type 6  Height:   Ht Readings from Last 1 Encounters:  03/01/22 4' 11"$  (1.499 m)    Weight:   Wt Readings from Last 1 Encounters:  10/27/22 96.2 kg    Ideal Body Weight:  44.3 kg  BMI:  Body mass index is 42.84 kg/m.  Estimated Nutritional Needs:   Kcal:  1800-2100kcal/day  Protein:  90-110g/d  Fluid:  1.4-1.6L/day  CasKoleen Distance, RD, LDN Please refer to [facility]  and/or RD on-call/weekend/after hours pager

## 2022-10-29 LAB — MISC LABCORP TEST (SEND OUT): Labcorp test code: 724195

## 2022-10-29 LAB — GLUCOSE, CAPILLARY
Glucose-Capillary: 111 mg/dL — ABNORMAL HIGH (ref 70–99)
Glucose-Capillary: 150 mg/dL — ABNORMAL HIGH (ref 70–99)
Glucose-Capillary: 95 mg/dL (ref 70–99)
Glucose-Capillary: 94 mg/dL (ref 70–99)
Glucose-Capillary: 115 mg/dL — ABNORMAL HIGH (ref 70–99)

## 2022-10-29 MED ORDER — ZINC CHLORIDE 1 MG/ML IV SOLN
INTRAVENOUS | Status: AC
  Filled 2022-10-29: qty 672

## 2022-10-29 NOTE — TOC Progression Note (Signed)
Transition of Care [facility]) - Progression Note    Patient Details  Name: Debra [name] MRN: [id number] Date of Birth: February 29, 1966  [facility]) CM/SW Contact  Beverly Sessions, RN Phone Number: 10/29/2022, 10:58 AM  Clinical Narrative:    Case again reviewed with [facility] supervisor  - patient still requiring ongoing TPN with out stop date.  At this time patient is not eligible for LOG for TPN due to not having stop date - Per surgery. Patient  will need intermittent suction with Yankauer to control effluent. There is not a home device listed for this use - Patient still requiring Eakins pouches which would be an out of pocket expense - Per registration patient not eligible for Medicaid due to not having documentation of [name] citizenship  - patient would potentially be a candidate for [facility] home health at discharge  [facility] signing off at this time.  Please consult TOC when medically appropriate     Expected Discharge Plan: [place] Barriers to Discharge: Continued Medical Work up  Expected Discharge Plan and Services                                               Social Determinants of Health (SDOH) Interventions SDOH Screenings   Food Insecurity: No Food Insecurity (05/20/2022)  Housing: Low Risk  (05/20/2022)  Transportation Needs: No Transportation Needs (05/20/2022)  Utilities: Not At Risk (05/20/2022)  Tobacco Use: Low Risk  (09/12/2022)    Readmission Risk Interventions     No data to display

## 2022-10-29 NOTE — Progress Notes (Addendum)
[facility] SURGICAL PROGRESS NOTE (cpt [phone])  Hospital Day(s): 258.   Post op day(s): 256 Days Post-Op.   Interval History:  Patient seen and examined No acute issues overnight  Sitting up eating breakfast No leaking No pain No new labs this morning [name] output 100 ccs + unmeasured; managing effluent independently On TPN; On Soft diet She is ambulating better; reviewed mobility notes   Review of Systems:  Constitutional: denies fever, chills  HEENT: denies cough or congestion  Respiratory: denies any shortness of breath  Cardiovascular: denies chest pain or palpitations  Gastrointestinal: denied abdominal pain, denies N/V Genitourinary: denies burning with urination or urinary frequency Integumentary: + midline wound (stable)   Vital signs in last 24 hours: [min-max] current  Temp:  [98 F (36.7 C)-98.4 F (36.9 C)] 98.4 F (36.9 C) (02/21 0527) Pulse Rate:  [75-80] 79 (02/21 0527) Resp:  [15-20] 15 (02/21 0527) BP: (100-109)/(52-58) 109/52 (02/21 0527) SpO2:  [99 %-100 %] 99 % (02/21 0527)     Height: 4' 11"$  (149.9 cm) Weight: 96.2 kg BMI (Calculated): 43.04   Intake/Output last 2 shifts:  02/20 0701 - 02/21 0700 In: 754.2 [I.V.:754.2] Out: 100 [Drains:100]   Physical Exam:  Constitutional: alert, cooperative and no distress  Respiratory: breathing non-labored at rest  Cardiovascular: regular rate and sinus rhythm  Gastrointestinal: Soft, rather unchanged suprapubic tenderness (mild), non-distended, no rebound/guarding. Integumentary: Midline wound open, peritoneum closed; granulating; there are three stomatized areas visible in the LUQ and LLQ portions of the wound   Labs:     Latest Ref Rng & Units 10/23/2022   12:21 AM 10/17/2022    7:03 AM 10/16/2022   12:06 AM  CBC  WBC 4.0 - 10.5 K/uL 4.0  3.7  3.9   Hemoglobin 12.0 - 15.0 g/dL 8.2  8.0  7.1   Hematocrit 36.0 - 46.0 % 26.2  24.7  22.5   Platelets 150 - 400 K/uL 123  111  117        Latest Ref Rng & Units 10/27/2022    6:20 AM 10/23/2022    7:39 AM 10/20/2022    7:50 AM  CMP  Glucose 70 - 99 mg/dL 119  147  129   BUN 6 - 20 mg/dL 23  23  22   $ Creatinine 0.44 - 1.00 mg/dL 0.38  0.41  0.45   Sodium 135 - 145 mmol/L 135  134  134   Potassium 3.5 - 5.1 mmol/L 4.5  4.3  4.0   Chloride 98 - 111 mmol/L 106  109  108   CO2 22 - 32 mmol/L 24  22  22   $ Calcium 8.9 - 10.3 mg/dL 8.2  8.0  8.2   Total Protein 6.5 - 8.1 g/dL 6.0  6.2  6.0   Total Bilirubin 0.3 - 1.2 mg/dL 0.4  0.6  0.6   Alkaline Phos 38 - 126 U/L 143  137  141   AST 15 - 41 U/L 38  34  28   ALT 0 - 44 U/L 33  25  23     Imaging studies: No new pertinent imaging studies this AM   Assessment/Plan:  57 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   - New 02/21: Nothing acute; nutritional labs tomorrow   -  Wound Care ([name] Pouch); Appreciate WOC RN assistance. No longer on intermittent or continuous suction. Patient seems to like this better and has started to manage effluent independently. She will need intermittent suction with Yankauer to control effluent. Likely be beneficial to suction before ambulation.    - Continue soft diet as tolerated  - Continue cyclic TPN; bi-weekly nutritional labs - Appreciate dietary assistance             - Monitor abdominal examination; on-going bowel function            - Pain control prn; antemetic prn            - Progressed with therapies; no longer any recommendations; ambulating well    - Appreciate neurosurgery assistance with compression fracture; serial XRs   - Discharge Planning: Continue to anticipate lengthy admission +/- possible transfer; no new progress regarding disposition unfortunately   All  of the above findings and recommendations were discussed with the patient, and the medical team, and all of patient's questions were answered to her expressed satisfaction.  -- [name], PA-C [facility] 10/29/2022, 7:23 AM M-F: 7am - 4pm

## 2022-10-29 NOTE — Progress Notes (Signed)
PHARMACY - TOTAL PARENTERAL NUTRITION CONSULT NOTE   Indication: Prolonged ileus  Patient Measurements: Height: 4' 11"$  (149.9 cm) Weight: 96.2 kg (212 lb 1.3 oz) IBW/kg (Calculated) : 43.2 TPN AdjBW (KG): 55.2 Body mass index is 42.84 kg/m.  Assessment: Debra [name] is a 57 y.o. female s/p laparotomy, excision of greater omental mass, abdominal wall reconstruction with [name], appendectomy, and placement of Prevena vac.  Glucose / Insulin:  --No apparent history of diabetes --BG 111 - 150 --rSSI 4x/day --SSI: 6u last 24h  17 units insulin in TPN (decreased from 20 units on 1/21)  Electrolytes: WNL Renal: SCr stable at approximate baseline  Hepatic: LFTs wnl --bilirubin wnl GI Imaging: 9/11 CTAP: no new acute issues GI Surgeries / Procedures: s/p laparotomy, excision of greater omental mass, abdominal wall reconstruction with [name], appendectomy, and placement of Prevena vac  Central access: 02/15/22 TPN start date: 02/15/22  Nutritional Goals: Goal cyclic TPN over 16 hrs: cyclic TPN over 16 hrs: (provides 102 g of protein and 1811 kcals per day) total volume over 16hrs for calculations=1400 ml (1500 ml total with overfill)  RD Assessment:  Estimated Needs Total Energy Estimated Needs: 1800-2100kcal/day Total Protein Estimated Needs: 90-110g/d Total Fluid Estimated Needs: 1.4-1.6L/day  Current Nutrition:  Soft diet + nutritional supplements, not meeting PO needs yet   Plan: Transitioned to *Cyclic* TPN on [id number].   to run over 16 hours per MD request. [name] at 2000 per discussion with Dietician to allow patient time for walking in afternoon/evenings.  To run over 16 hours: -Start rate at 49 mL/hr for 1 hour. -Increase rate to 98 mL/hr for 13 hours.  -Decrease rate to 49 mL/hr for 1 hour. -Decrease rate to 25 mL/hr for 1 hour, then stop.  Plan:  See cyclic TPN rate above Continue Nutritional Components Amino acids (using 15%  Clinisol): 101 g Dextrose 19% = 266 g Lipids (using 20% SMOFlipids): 50.4 g kCal: 1817/24h  Electrolytes in TPN: Na 30mq/L, K 49m/L, Ca 29m39mL, Mg 10 mEq/L, Phos 10 mmol/L.  Continue ratio Cl: Acet 1:1  Resume trace elements, chromium and zinc per dietary recs. Continue CBG/SSI for Cyclic TPN:  -CBG 2 hrs after cyclic TPN start -CBG during middle of cyclic TPN infusion -CBG 1 hr after cylic TPN stopped -CBG while off TPN Continue resistant SSI  and continue insulin in TPN at 17 units (decreased from 20 units on 1/21) Check TPN labs on Mon/Thurs at minimum Zinc increased to 5 mg in TPN per dietician recommendations 2/17 Selenium 60 mcg daily added to TPN based on RD recommendations  HowLorin PicketharmD Clinical Pharmacist 10/29/2022 9:31 AM

## 2022-10-30 LAB — COMPREHENSIVE METABOLIC PANEL
ALT: 29 U/L (ref 0–44)
AST: 37 U/L (ref 15–41)
Albumin: 2.5 g/dL — ABNORMAL LOW (ref 3.5–5.0)
Alkaline Phosphatase: 134 U/L — ABNORMAL HIGH (ref 38–126)
Anion gap: 6 (ref 5–15)
BUN: 21 mg/dL — ABNORMAL HIGH (ref 6–20)
CO2: 23 mmol/L (ref 22–32)
Calcium: 8.3 mg/dL — ABNORMAL LOW (ref 8.9–10.3)
Chloride: 105 mmol/L (ref 98–111)
Creatinine, Ser: 0.42 mg/dL — ABNORMAL LOW (ref 0.44–1.00)
GFR, Estimated: >60 mL/min (ref >60)
Glucose, Bld: 142 mg/dL — ABNORMAL HIGH (ref 70–99)
Potassium: 4.3 mmol/L (ref 3.5–5.1)
Sodium: 134 mmol/L — ABNORMAL LOW (ref 135–145)
Total Bilirubin: 0.6 mg/dL (ref 0.3–1.2)
Total Protein: 6 g/dL — ABNORMAL LOW (ref 6.5–8.1)

## 2022-10-30 LAB — MAGNESIUM: Magnesium: 2 mg/dL (ref 1.7–2.4)

## 2022-10-30 LAB — CBC
HCT: 25.5 % — ABNORMAL LOW (ref 36.0–46.0)
Hemoglobin: 8.1 g/dL — ABNORMAL LOW (ref 12.0–15.0)
MCH: 29.9 pg (ref 26.0–34.0)
MCHC: 31.8 g/dL (ref 30.0–36.0)
MCV: 94.1 fL (ref 80.0–100.0)
Platelets: 118 10*3/uL — ABNORMAL LOW (ref 150–400)
RBC: 2.71 MIL/uL — ABNORMAL LOW (ref 3.87–5.11)
RDW: 22.5 % — ABNORMAL HIGH (ref 11.5–15.5)
WBC: 3.1 10*3/uL — ABNORMAL LOW (ref 4.0–10.5)
nRBC: 0 % (ref 0.0–0.2)

## 2022-10-30 LAB — GLUCOSE, CAPILLARY
Glucose-Capillary: 138 mg/dL — ABNORMAL HIGH (ref 70–99)
Glucose-Capillary: 118 mg/dL — ABNORMAL HIGH (ref 70–99)
Glucose-Capillary: 107 mg/dL — ABNORMAL HIGH (ref 70–99)

## 2022-10-30 LAB — PHOSPHORUS: Phosphorus: 3.3 mg/dL (ref 2.5–4.6)

## 2022-10-30 MED ORDER — ZINC CHLORIDE 1 MG/ML IV SOLN
INTRAVENOUS | Status: AC
  Filled 2022-10-30: qty 672

## 2022-10-30 NOTE — Progress Notes (Signed)
[facility] SURGICAL PROGRESS NOTE (cpt [phone])  Hospital Day(s): 259.   Post op day(s): 257 Days Post-Op.   Interval History:  Patient seen and examined No acute issues overnight  Doing well; no acute change Stable leukopenia to 3.1 Hgb stable as well to 8.1 Renal function remains normal; sCr - 0.42; UO - unmeasured No significant electrolyte derangements  [name] output  530 ccs + unmeasured; managing effluent independently On TPN; On Soft diet She is ambulating better; reviewed mobility notes   Review of Systems:  Constitutional: denies fever, chills  HEENT: denies cough or congestion  Respiratory: denies any shortness of breath  Cardiovascular: denies chest pain or palpitations  Gastrointestinal: denied abdominal pain, denies N/V Genitourinary: denies burning with urination or urinary frequency Integumentary: + midline wound (stable)   Vital signs in last 24 hours: [min-max] current  Temp:  [97.7 F (36.5 C)-97.9 F (36.6 C)] 97.8 F (36.6 C) (02/22 0328) Pulse Rate:  [69-76] 69 (02/22 0328) Resp:  [18-20] 20 (02/22 0328) BP: (94-109)/(49-53) 99/49 (02/22 0328) SpO2:  [100 %] 100 % (02/22 0328) Weight:  [96.5 kg] 96.5 kg (02/22 0500)     Height: 4' 11"$  (149.9 cm) Weight: 96.5 kg BMI (Calculated): 43.04   Intake/Output last 2 shifts:  02/21 0701 - 02/22 0700 In: [id number] [P.O.:240; I.V.:851] Out: 530 [Stool:530]   Physical Exam:  Constitutional: alert, cooperative and no distress  Respiratory: breathing non-labored at rest  Cardiovascular: regular rate and sinus rhythm  Gastrointestinal: Soft, rather unchanged suprapubic tenderness (mild), non-distended, no rebound/guarding. Integumentary: Midline wound open, peritoneum closed; granulating; there are three stomatized areas visible in the LUQ and LLQ portions of the wound   Labs:     Latest Ref Rng & Units 10/30/2022   12:12 AM 10/23/2022   12:21 AM 10/17/2022    7:03 AM  CBC  WBC 4.0 - 10.5 K/uL  3.1  4.0  3.7   Hemoglobin 12.0 - 15.0 g/dL 8.1  8.2  8.0   Hematocrit 36.0 - 46.0 % 25.5  26.2  24.7   Platelets 150 - 400 K/uL 118  123  111       Latest Ref Rng & Units 10/30/2022    5:30 AM 10/27/2022    6:20 AM 10/23/2022    7:39 AM  CMP  Glucose 70 - 99 mg/dL 142  119  147   BUN 6 - 20 mg/dL 21  23  23   $ Creatinine 0.44 - 1.00 mg/dL 0.42  0.38  0.41   Sodium 135 - 145 mmol/L 134  135  134   Potassium 3.5 - 5.1 mmol/L 4.3  4.5  4.3   Chloride 98 - 111 mmol/L 105  106  109   CO2 22 - 32 mmol/L 23  24  22   $ Calcium 8.9 - 10.3 mg/dL 8.3  8.2  8.0   Total Protein 6.5 - 8.1 g/dL 6.0  6.0  6.2   Total Bilirubin 0.3 - 1.2 mg/dL 0.6  0.4  0.6   Alkaline Phos 38 - 126 U/L 134  143  137   AST 15 - 41 U/L 37  38  34   ALT 0 - 44 U/L 29  33  25     Imaging studies: No new pertinent imaging studies this AM   Assessment/Plan:  57 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   - New 02/22: Nothing acute; labs reassuring this morning   - Wound Care ([place]); Appreciate WOC RN assistance. No longer on intermittent or continuous suction. Patient seems to like this better and has started to manage effluent independently. She will need intermittent suction with Yankauer to control effluent. Likely be beneficial to suction before ambulation.    - Continue soft diet as tolerated  - Continue cyclic TPN; bi-weekly nutritional labs - Appreciate dietary assistance             - Monitor abdominal examination; on-going bowel function            - Pain control prn; antemetic prn            - Progressed with therapies; no longer any recommendations; ambulating well    - Appreciate neurosurgery assistance with compression fracture;  serial XRs   - Discharge Planning: Continue to anticipate lengthy admission +/- possible transfer; no new progress regarding disposition unfortunately   All of the above findings and recommendations were discussed with the patient, and the medical team, and all of patient's questions were answered to her expressed satisfaction.  -- [name], PA-C [facility] 10/30/2022, 8:30 AM M-F: 7am - 4pm

## 2022-10-30 NOTE — Progress Notes (Signed)
PHARMACY - TOTAL PARENTERAL NUTRITION CONSULT NOTE   Indication: Prolonged ileus  Patient Measurements: Height: 4' 11"$  (149.9 cm) Weight: 96.5 kg (212 lb 11.9 oz) IBW/kg (Calculated) : 43.2 TPN AdjBW (KG): 55.2 Body mass index is 42.97 kg/m.  Assessment: Debra [name] is a 57 y.o. female s/p laparotomy, excision of greater omental mass, abdominal wall reconstruction with [name], appendectomy, and placement of Prevena vac.  Glucose / Insulin:  --No apparent history of diabetes --BG 94-138 --rSSI 4x/day --SSI: 3u last 24h  17 units insulin in TPN (decreased from 20 units on 1/21)  Electrolytes: WNL Renal: SCr stable at approximate baseline  Hepatic: LFTs wnl --bilirubin wnl GI Imaging: 9/11 CTAP: no new acute issues GI Surgeries / Procedures: s/p laparotomy, excision of greater omental mass, abdominal wall reconstruction with [name], appendectomy, and placement of Prevena vac  Central access: 02/15/22 TPN start date: 02/15/22  Nutritional Goals: Goal cyclic TPN over 16 hrs: cyclic TPN over 16 hrs: (provides 102 g of protein and 1811 kcals per day) total volume over 16hrs for calculations=1400 ml (1500 ml total with overfill)  RD Assessment:  Estimated Needs Total Energy Estimated Needs: 1800-2100kcal/day Total Protein Estimated Needs: 90-110g/d Total Fluid Estimated Needs: 1.4-1.6L/day  Current Nutrition:  Soft diet + nutritional supplements, not meeting PO needs yet   Plan: Transitioned to *Cyclic* TPN on [id number].   to run over 16 hours per MD request. [name] at 2000 per discussion with Dietician to allow patient time for walking in afternoon/evenings.  To run over 16 hours: -Start rate at 49 mL/hr for 1 hour. -Increase rate to 98 mL/hr for 13 hours.  -Decrease rate to 49 mL/hr for 1 hour. -Decrease rate to 25 mL/hr for 1 hour, then stop.  Plan:  See cyclic TPN rate above Continue Nutritional Components Amino acids (using 15%  Clinisol): 101 g Dextrose 19% = 266 g Lipids (using 20% SMOFlipids): 50.4 g kCal: 1817/24h  Electrolytes in TPN: Na 82mq/L, K 438m/L, Ca 15m4mL, Mg 10 mEq/L, Phos 10 mmol/L.  Continue ratio Cl: Acet 1:1  Resume trace elements, chromium and zinc per dietary recs. Continue CBG/SSI for Cyclic TPN:  -CBG 2 hrs after cyclic TPN start -CBG during middle of cyclic TPN infusion -CBG 1 hr after cylic TPN stopped -CBG while off TPN Continue resistant SSI  and continue insulin in TPN at 17 units (decreased from 20 units on 1/21) Check TPN labs on Mon/Thurs at minimum Zinc increased to 5 mg in TPN per dietician recommendations 2/17 Selenium 60 mcg daily added to TPN based on RD recommendations  HowLorin PicketharmD Clinical Pharmacist 10/30/2022 9:13 AM

## 2022-10-31 LAB — GLUCOSE, CAPILLARY
Glucose-Capillary: 152 mg/dL — ABNORMAL HIGH (ref 70–99)
Glucose-Capillary: 138 mg/dL — ABNORMAL HIGH (ref 70–99)
Glucose-Capillary: 158 mg/dL — ABNORMAL HIGH (ref 70–99)
Glucose-Capillary: 77 mg/dL (ref 70–99)
Glucose-Capillary: 120 mg/dL — ABNORMAL HIGH (ref 70–99)

## 2022-10-31 MED ORDER — ZINC CHLORIDE 1 MG/ML IV SOLN
INTRAVENOUS | Status: AC
  Filled 2022-10-31: qty 672

## 2022-10-31 NOTE — Progress Notes (Signed)
PHARMACY - TOTAL PARENTERAL NUTRITION CONSULT NOTE   Indication: Prolonged ileus  Patient Measurements: Height: '4\' 11"'$  (149.9 cm) Weight: 96.5 kg (212 lb 11.9 oz) IBW/kg (Calculated) : 43.2 TPN AdjBW (KG): 55.2 Body mass index is 42.97 kg/m.  Assessment: Debra [name] is a 57 y.o. female s/p laparotomy, excision of greater omental mass, abdominal wall reconstruction with [name], appendectomy, and placement of Prevena vac.  Glucose / Insulin:  --No apparent history of diabetes --BG 107-152 --rSSI 4x/day --SSI: 7u last 24h  17 units insulin in TPN (decreased from 20 units on 1/21)  Electrolytes: WNL Renal: SCr stable at approximate baseline  Hepatic: LFTs wnl --bilirubin wnl GI Imaging: 9/11 CTAP: no new acute issues GI Surgeries / Procedures: s/p laparotomy, excision of greater omental mass, abdominal wall reconstruction with [name], appendectomy, and placement of Prevena vac  Central access: 02/15/22 TPN start date: 02/15/22  Nutritional Goals: Goal cyclic TPN over 16 hrs: cyclic TPN over 16 hrs: (provides 102 g of protein and 1811 kcals per day) total volume over 16hrs for calculations=1400 ml (1500 ml total with overfill)  RD Assessment:  Estimated Needs Total Energy Estimated Needs: 1800-2100kcal/day Total Protein Estimated Needs: 90-110g/d Total Fluid Estimated Needs: 1.4-1.6L/day  Current Nutrition:  Soft diet + nutritional supplements, not meeting PO needs yet   Plan: Transitioned to *Cyclic* TPN on [id number].   to run over 16 hours per MD request. [name] at 2000 per discussion with Dietician to allow patient time for walking in afternoon/evenings.  To run over 16 hours: -Start rate at 49 mL/hr for 1 hour. -Increase rate to 98 mL/hr for 13 hours.  -Decrease rate to 49 mL/hr for 1 hour. -Decrease rate to 25 mL/hr for 1 hour, then stop.  Plan:  See cyclic TPN rate above Continue Nutritional Components Amino acids (using 15%  Clinisol): 101 g Dextrose 19% = 266 g Lipids (using 20% SMOFlipids): 50.4 g kCal: 1817/24h  Electrolytes in TPN: Na 17mq/L, K 48m/L, Ca 9m64mL, Mg 10 mEq/L, Phos 10 mmol/L.  Continue ratio Cl: Acet 1:1  Resume trace elements, chromium and zinc per dietary recs. Continue CBG/SSI for Cyclic TPN:  -CBG 2 hrs after cyclic TPN start -CBG during middle of cyclic TPN infusion -CBG 1 hr after cylic TPN stopped -CBG while off TPN Continue resistant SSI  and continue insulin in TPN at 17 units (decreased from 20 units on 1/21) Check TPN labs on Mon/Thurs at minimum Zinc increased to 5 mg in TPN per dietician recommendations 2/17 Selenium 60 mcg daily added to TPN based on RD recommendations  HowLorin PicketharmD Clinical Pharmacist 10/31/2022 6:57 AM

## 2022-10-31 NOTE — Consult Note (Signed)
[facility] Nurse Consult Note:  [facility] nursing team will not follow routinely, but will remain available to this patient, the nursing, surgical, and medical teams while she is in house. This has been discussed with Dr. Dahlia Byes and he is in agreement and will reconsult [facility] nurse as needed.  Bedside staff are able to change pouch, obtain supplies. Surgical team will continue to follow.  Thank you for inviting [name] to participate in this patient's Plan of Care.  Maudie Flakes, MSN, RN, CNS, [place], Serita Grammes, [employer], [employer] phone:  [phone]

## 2022-10-31 NOTE — Progress Notes (Signed)
[facility] SUR[facility]ICAL PROGRESS NOTE (cpt [phone])  Hospital Day(s): 260.   Post op day(s): 258 Days Post-Op.   Interval History:  Patient seen and examined No acute issues overnight  Doing well; no acute change No new labs this AM [name] output unmeasured; managing effluent independently On TPN; On Soft diet She is ambulating better; reviewed mobility notes   Review of Systems:  Constitutional: denies fever, chills  HEENT: denies cough or congestion  Respiratory: denies any shortness of breath  Cardiovascular: denies chest pain or palpitations  Gastrointestinal: denied abdominal pain, denies N/V Genitourinary: denies burning with urination or urinary frequency Integumentary: + midline wound (stable)   Vital signs in last 24 hours: [min-max] current  Temp:  [98 F (36.7 C)-98.6 F (37 C)] 98.1 F (36.7 C) (02/23 0747) Pulse Rate:  [72-92] 81 (02/23 0747) Resp:  [16-20] 18 (02/23 0747) BP: (87-103)/(49-67) 99/55 (02/23 0747) SpO2:  [100 %] 100 % (02/23 0747) Weight:  [98.2 kg] 98.2 kg (02/23 0700)     Height: '4\' 11"'$  (149.9 cm) Weight: 98.2 kg BMI (Calculated): 43.04   Intake/Output last 2 shifts:  02/22 0701 - 02/23 0700 In: 736.5 [I.V.:736.5] Out: -    Physical Exam:  Constitutional: alert, cooperative and no distress  Respiratory: breathing non-labored at rest  Cardiovascular: regular rate and sinus rhythm  Gastrointestinal: Soft, rather unchanged suprapubic tenderness (mild), non-distended, no rebound/guarding. Integumentary: Midline wound open, peritoneum closed; granulating; there are three stomatized areas visible in the LUQ and LLQ portions of the wound   Labs:     Latest Ref Rng & Units 10/30/2022   12:12 AM 10/23/2022   12:21 AM 10/17/2022    7:03 AM  CBC  WBC 4.0 - 10.5 K/uL 3.1  4.0  3.7   Hemoglobin 12.0 - 15.0 g/dL 8.1  8.2  8.0   Hematocrit 36.0 - 46.0 % 25.5  26.2  24.7   Platelets 150 - 400 K/uL 118  123  111       Latest Ref Rng &  Units 10/30/2022    5:30 AM 10/27/2022    6:20 AM 10/23/2022    7:39 AM  CMP  Glucose 70 - 99 mg/dL 142  119  147   BUN 6 - 20 mg/dL '21  23  23   '$ Creatinine 0.44 - 1.00 mg/dL 0.42  0.38  0.41   Sodium 135 - 145 mmol/L 134  135  134   Potassium 3.5 - 5.1 mmol/L 4.3  4.5  4.3   Chloride 98 - 111 mmol/L 105  106  109   CO2 22 - 32 mmol/L '23  24  22   '$ Calcium 8.9 - 10.3 mg/dL 8.3  8.2  8.0   Total Protein 6.5 - 8.1 g/dL 6.0  6.0  6.2   Total Bilirubin 0.3 - 1.2 mg/dL 0.6  0.4  0.6   Alkaline Phos 38 - 126 U/L 134  143  137   AST 15 - 41 U/L 37  38  34   ALT 0 - 44 U/L 29  33  25     Imaging studies: No new pertinent imaging studies this AM   Assessment/Plan:  57 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   - New 02/23: Nothing acute; doing well considering   -  Wound Care ([name] Pouch); Appreciate WOC RN assistance. No longer on intermittent or continuous suction. Patient seems to like this better and has started to manage effluent independently. She will need intermittent suction with Yankauer to control effluent. Likely be beneficial to suction before ambulation.    - Continue soft diet as tolerated  - Continue cyclic TPN; bi-weekly nutritional labs - Appreciate dietary assistance             - Monitor abdominal examination; on-going bowel function            - Pain control prn; antemetic prn            - Progressed with therapies; no longer any recommendations; ambulating well    - Appreciate neurosurgery assistance with compression fracture; serial XRs   - Discharge Planning: Continue to anticipate lengthy admission +/- possible transfer; no new progress regarding disposition unfortunately   All of the above  findings and recommendations were discussed with the patient, and the medical team, and all of patient's questions were answered to her expressed satisfaction.  -- [name], PA-C [facility] 10/31/2022, 9:11 AM M-F: 7am - 4pm

## 2022-11-01 MED ORDER — ZINC CHLORIDE 1 MG/ML IV SOLN
INTRAVENOUS | Status: AC
  Filled 2022-11-01: qty 672

## 2022-11-01 NOTE — Progress Notes (Signed)
PHARMACY - TOTAL PARENTERAL NUTRITION CONSULT NOTE   Indication: Prolonged ileus  Patient Measurements: Height: '4\' 11"'$  (149.9 cm) Weight: 98.2 kg (216 lb 7.9 oz) IBW/kg (Calculated) : 43.2 TPN AdjBW (KG): 55.2 Body mass index is 43.73 kg/m.  Assessment: Debra [name] is a 57 y.o. female s/p laparotomy, excision of greater omental mass, abdominal wall reconstruction with [name], appendectomy, and placement of Prevena vac.  Glucose / Insulin:  --No apparent history of diabetes --BG 107-152 --rSSI 4x/day --SSI: 8u last 24h  17 units insulin in TPN (decreased from 20 units on 1/21)  Electrolytes: WNL Renal: SCr stable at approximate baseline  Hepatic: LFTs wnl --bilirubin wnl GI Imaging: 9/11 CTAP: no new acute issues GI Surgeries / Procedures: s/p laparotomy, excision of greater omental mass, abdominal wall reconstruction with [name], appendectomy, and placement of Prevena vac  Central access: 02/15/22 TPN start date: 02/15/22  Nutritional Goals: Goal cyclic TPN over 16 hrs: cyclic TPN over 16 hrs: (provides 102 g of protein and 1811 kcals per day) total volume over 16hrs for calculations=1400 ml (1500 ml total with overfill)  RD Assessment:  Estimated Needs Total Energy Estimated Needs: 1800-2100kcal/day Total Protein Estimated Needs: 90-110g/d Total Fluid Estimated Needs: 1.4-1.6L/day  Current Nutrition:  Soft diet + nutritional supplements, not meeting PO needs yet   Plan: Transitioned to *Cyclic* TPN on [id number].   to run over 16 hours per MD request. [name] at 2000 per discussion with Dietician to allow patient time for walking in afternoon/evenings.  To run over 16 hours: -Start rate at 49 mL/hr for 1 hour. -Increase rate to 98 mL/hr for 13 hours.  -Decrease rate to 49 mL/hr for 1 hour. -Decrease rate to 25 mL/hr for 1 hour, then stop.  Plan:  See cyclic TPN rate above Continue Nutritional Components Amino acids (using 15%  Clinisol): 101 g Dextrose 19% = 266 g Lipids (using 20% SMOFlipids): 50.4 g kCal: 1817/24h  Electrolytes in TPN: Na 157mq/L, K 449m/L, Ca 57m58mL, Mg 10 mEq/L, Phos 10 mmol/L.  Continue ratio Cl: Acet 1:1  Resume trace elements, chromium and zinc per dietary recs. Continue CBG/SSI for Cyclic TPN:  -CBG 2 hrs after cyclic TPN start -CBG during middle of cyclic TPN infusion -CBG 1 hr after cylic TPN stopped -CBG while off TPN Continue resistant SSI  and continue insulin in TPN at 17 units (decreased from 20 units on 1/21) Check TPN labs on Mon/Thurs at minimum Zinc increased to 5 mg in TPN per dietician recommendations 2/17 Selenium 60 mcg daily added to TPN based on RD recommendations  [name] Clinical Pharmacist 11/01/2022 9:19 AM

## 2022-11-01 NOTE — Plan of Care (Signed)
  Problem: Nutrition: Goal: Adequate nutrition will be maintained Outcome: Progressing   Problem: Activity: Goal: Risk for activity intolerance will decrease Outcome: Progressing   Problem: Pain Managment: Goal: General experience of comfort will improve Outcome: Progressing   

## 2022-11-01 NOTE — Progress Notes (Signed)
Seen and examined  Continues to do well, some nausea No more bleeding Keep tpn and eakin

## 2022-11-02 LAB — GLUCOSE, CAPILLARY
Glucose-Capillary: 112 mg/dL — ABNORMAL HIGH (ref 70–99)
Glucose-Capillary: 196 mg/dL — ABNORMAL HIGH (ref 70–99)

## 2022-11-02 MED ORDER — ZINC CHLORIDE 1 MG/ML IV SOLN
INTRAVENOUS | Status: AC
  Filled 2022-11-02: qty 672

## 2022-11-02 NOTE — Progress Notes (Signed)
No new changes Ambulating in the hall with a walker Continue supportive care

## 2022-11-02 NOTE — Plan of Care (Signed)
  Problem: Clinical Measurements: Goal: Will remain free from infection Outcome: Progressing   Problem: Nutrition: Goal: Adequate nutrition will be maintained Outcome: Progressing   Problem: Activity: Goal: Risk for activity intolerance will decrease Outcome: Progressing   Problem: Nutrition: Goal: Adequate nutrition will be maintained Outcome: Progressing   Problem: Coping: Goal: Level of anxiety will decrease Outcome: Progressing   Problem: Pain Managment: Goal: General experience of comfort will improve Outcome: Progressing

## 2022-11-02 NOTE — Progress Notes (Signed)
PHARMACY - TOTAL PARENTERAL NUTRITION CONSULT NOTE   Indication: Prolonged ileus  Patient Measurements: Height: '4\' 11"'$  (149.9 cm) Weight: 98.2 kg (216 lb 7.9 oz) IBW/kg (Calculated) : 43.2 TPN AdjBW (KG): 55.2 Body mass index is 43.73 kg/m.  Assessment: Debra [name] is a 57 y.o. female s/p laparotomy, excision of greater omental mass, abdominal wall reconstruction with [name], appendectomy, and placement of Prevena vac.  Glucose / Insulin:  --No apparent history of diabetes --BG 120-158 --rSSI 4x/day --SSI: 6u last 24h  17 units insulin in TPN (decreased from 20 units on 1/21)  Electrolytes: WNL Renal: SCr stable at approximate baseline  Hepatic: LFTs wnl --bilirubin wnl GI Imaging: 9/11 CTAP: no new acute issues GI Surgeries / Procedures: s/p laparotomy, excision of greater omental mass, abdominal wall reconstruction with [name], appendectomy, and placement of Prevena vac  Central access: 02/15/22 TPN start date: 02/15/22  Nutritional Goals: Goal cyclic TPN over 16 hrs: cyclic TPN over 16 hrs: (provides 102 g of protein and 1811 kcals per day) total volume over 16hrs for calculations=1400 ml (1500 ml total with overfill)  RD Assessment:  Estimated Needs Total Energy Estimated Needs: 1800-2100kcal/day Total Protein Estimated Needs: 90-110g/d Total Fluid Estimated Needs: 1.4-1.6L/day  Current Nutrition:  Soft diet + nutritional supplements, not meeting PO needs yet   Plan: Transitioned to *Cyclic* TPN on [id number].   to run over 16 hours per MD request. [name] at 2000 per discussion with Dietician to allow patient time for walking in afternoon/evenings.  To run over 16 hours: -Start rate at 49 mL/hr for 1 hour. -Increase rate to 98 mL/hr for 13 hours.  -Decrease rate to 49 mL/hr for 1 hour. -Decrease rate to 25 mL/hr for 1 hour, then stop.  Plan:  See cyclic TPN rate above Continue Nutritional Components Amino acids (using 15%  Clinisol): 101 g Dextrose 19% = 266 g Lipids (using 20% SMOFlipids): 50.4 g kCal: 1817/24h  Electrolytes in TPN: Na 42mq/L, K 416m/L, Ca 59m56mL, Mg 10 mEq/L, Phos 10 mmol/L.  Continue ratio Cl: Acet 1:1  Resume trace elements, chromium and zinc per dietary recs. Continue CBG/SSI for Cyclic TPN:  -CBG 2 hrs after cyclic TPN start -CBG during middle of cyclic TPN infusion -CBG 1 hr after cylic TPN stopped -CBG while off TPN Continue resistant SSI  and continue insulin in TPN at 17 units (decreased from 20 units on 1/21) Check TPN labs on Mon/Thurs at minimum Zinc increased to 5 mg in TPN per dietician recommendations 2/17 Selenium 60 mcg daily added to TPN based on RD recommendations  [name] Clinical Pharmacist 11/02/2022 8:41 AM

## 2022-11-02 NOTE — Plan of Care (Signed)
  Problem: Clinical Measurements: Goal: Will remain free from infection 11/02/2022 0615 by Bonner Puna, RN Outcome: Progressing 11/02/2022 0219 by Bonner Puna, RN Outcome: Progressing   Problem: Nutrition: Goal: Adequate nutrition will be maintained 11/02/2022 0615 by Bonner Puna, RN Outcome: Progressing 11/02/2022 0219 by Bonner Puna, RN Outcome: Progressing   Problem: Activity: Goal: Risk for activity intolerance will decrease 11/02/2022 0615 by Bonner Puna, RN Outcome: Progressing 11/02/2022 0219 by Bonner Puna, RN Outcome: Progressing   Problem: Coping: Goal: Level of anxiety will decrease 11/02/2022 0615 by Bonner Puna, RN Outcome: Progressing 11/02/2022 0219 by Bonner Puna, RN Outcome: Progressing   Problem: Pain Managment: Goal: General experience of comfort will improve 11/02/2022 0615 by Bonner Puna, RN Outcome: Progressing 11/02/2022 0219 by Bonner Puna, RN Outcome: Progressing

## 2022-11-03 LAB — GLUCOSE, CAPILLARY
Glucose-Capillary: 151 mg/dL — ABNORMAL HIGH (ref 70–99)
Glucose-Capillary: 126 mg/dL — ABNORMAL HIGH (ref 70–99)
Glucose-Capillary: 127 mg/dL — ABNORMAL HIGH (ref 70–99)
Glucose-Capillary: 103 mg/dL — ABNORMAL HIGH (ref 70–99)
Glucose-Capillary: 140 mg/dL — ABNORMAL HIGH (ref 70–99)
Glucose-Capillary: 143 mg/dL — ABNORMAL HIGH (ref 70–99)
Glucose-Capillary: 130 mg/dL — ABNORMAL HIGH (ref 70–99)
Glucose-Capillary: 113 mg/dL — ABNORMAL HIGH (ref 70–99)
Glucose-Capillary: 161 mg/dL — ABNORMAL HIGH (ref 70–99)
Glucose-Capillary: 112 mg/dL — ABNORMAL HIGH (ref 70–99)
Glucose-Capillary: 120 mg/dL — ABNORMAL HIGH (ref 70–99)
Glucose-Capillary: 105 mg/dL — ABNORMAL HIGH (ref 70–99)

## 2022-11-03 LAB — COMPREHENSIVE METABOLIC PANEL
ALT: 31 U/L (ref 0–44)
AST: 38 U/L (ref 15–41)
Albumin: 2.5 g/dL — ABNORMAL LOW (ref 3.5–5.0)
Alkaline Phosphatase: 150 U/L — ABNORMAL HIGH (ref 38–126)
Anion gap: 5 (ref 5–15)
BUN: 24 mg/dL — ABNORMAL HIGH (ref 6–20)
CO2: 23 mmol/L (ref 22–32)
Calcium: 8.2 mg/dL — ABNORMAL LOW (ref 8.9–10.3)
Chloride: 106 mmol/L (ref 98–111)
Creatinine, Ser: 0.44 mg/dL (ref 0.44–1.00)
GFR, Estimated: >60 mL/min (ref >60)
Glucose, Bld: 145 mg/dL — ABNORMAL HIGH (ref 70–99)
Potassium: 4.7 mmol/L (ref 3.5–5.1)
Sodium: 134 mmol/L — ABNORMAL LOW (ref 135–145)
Total Bilirubin: 0.5 mg/dL (ref 0.3–1.2)
Total Protein: 6.1 g/dL — ABNORMAL LOW (ref 6.5–8.1)

## 2022-11-03 LAB — MAGNESIUM: Magnesium: 2.1 mg/dL (ref 1.7–2.4)

## 2022-11-03 LAB — PHOSPHORUS: Phosphorus: 3.1 mg/dL (ref 2.5–4.6)

## 2022-11-03 MED ORDER — ACETAMINOPHEN 325 MG PO TABS
650.0000 mg | ORAL_TABLET | Freq: Four times a day (QID) | ORAL | Status: DC | PRN
  Administered 2022-11-03 – 2023-04-06 (×21): 650 mg via ORAL
  Filled 2022-11-03 (×22): qty 2

## 2022-11-03 MED ORDER — ZINC CHLORIDE 1 MG/ML IV SOLN
INTRAVENOUS | Status: AC
  Filled 2022-11-03: qty 672

## 2022-11-03 NOTE — Progress Notes (Signed)
PHARMACY - TOTAL PARENTERAL NUTRITION CONSULT NOTE   Indication: Prolonged ileus  Patient Measurements: Height: '4\' 11"'$  (149.9 cm) Weight: 99.1 kg (218 lb 7.6 oz) IBW/kg (Calculated) : 43.2 TPN AdjBW (KG): 55.2 Body mass index is 44.13 kg/m.  Assessment: Debra [name] is a 57 y.o. female s/p laparotomy, excision of greater omental mass, abdominal wall reconstruction with [name], appendectomy, and placement of Prevena vac.  Glucose / Insulin:  --No apparent history of diabetes --CBG checks 4x/day (0500, 1300, 1800, 2200) --BG controlled on current regimen --rSSI 4x/day (0500, 1300, 1800, 2200) --SSI last 24h: 7u  --17 units insulin in TPN (decreased from 20 units on 1/21) Electrolytes: Within normal limits Renal: SCr stable at baseline  Hepatic: Liver function overall within normal limits GI Imaging: 9/11 CTAP: no new acute issues GI Surgeries / Procedures: s/p laparotomy, excision of greater omental mass, abdominal wall reconstruction with [name], appendectomy, and placement of Prevena vac  Central access: 02/15/22 TPN start date: 02/15/22  Nutritional Goals: Goal cyclic TPN over 16 hrs: cyclic TPN over 16 hrs: (provides 102 g of protein and 1811 kcals per day) total volume over 16hrs for calculations=1400 ml (1500 ml total with overfill)  RD Assessment:  Estimated Needs Total Energy Estimated Needs: 1800-2100kcal/day Total Protein Estimated Needs: 90-110g/d Total Fluid Estimated Needs: 1.4-1.6L/day  Current Nutrition:  Soft diet + nutritional supplements, not meeting PO needs yet   Plan: Transitioned to *Cyclic* TPN on [id number].   to run over 16 hours per MD request. [name] at 2000 per discussion with dietician to allow patient time for walking in afternoon/evenings.  To run over 16 hours: -Start rate at 49 mL/hr for 1 hour. -Increase rate to 98 mL/hr for 13 hours.  -Decrease rate to 49 mL/hr for 1 hour. -Decrease rate to 25 mL/hr for  1 hour, then stop.  Plan:  See cyclic TPN rate above Continue Nutritional Components Amino acids (using 15% Clinisol): 101 g Dextrose 19% = 266 g Lipids (using 20% SMOFlipids): 50.4 g kCal: 1817/24h  Electrolytes in TPN: Na 24mq/L, K 432m/L, Ca 66m89mL, Mg 10 mEq/L, Phos 10 mmol/L.  Continue ratio Cl:Ac 1:1  Continue trace elements, chromium, zinc, and selenium per dietary recs. Continue CBG/SSI for Cyclic TPN:  -CBG 2 hrs after cyclic TPN start -CBG during middle of cyclic TPN infusion -CBG 1 hr after cylic TPN stopped -CBG while off TPN Continue resistant SSI and insulin in TPN at 17 units Check TPN labs on Mon/Thurs at minimum  [name]26/2024 7:28 AM

## 2022-11-03 NOTE — Progress Notes (Signed)
[facility] SURGICAL PROGRESS NOTE (cpt [phone])  Hospital Day(s): 263.   Post op day(s): 261 Days Post-Op.   Interval History:  Patient seen and examined No acute issues overnight  Doing well; no acute change No new labs this AM; nutritional labs pending - just drawn  [name] output 150 ccs + unmeasured; managing effluent independently On TPN; On Soft diet She is ambulating better; reviewed mobility notes   Review of Systems:  Constitutional: denies fever, chills  HEENT: denies cough or congestion  Respiratory: denies any shortness of breath  Cardiovascular: denies chest pain or palpitations  Gastrointestinal: denied abdominal pain, denies N/V Genitourinary: denies burning with urination or urinary frequency Integumentary: + midline wound (stable)   Vital signs in last 24 hours: [min-max] current  Temp:  [97.2 F (36.2 C)-98.2 F (36.8 C)] 98.2 F (36.8 C) (02/26 0451) Pulse Rate:  [73-82] 82 (02/26 0451) Resp:  [16-17] 17 (02/26 0451) BP: (95-115)/(48-84) 101/58 (02/26 0451) SpO2:  [98 %-100 %] 100 % (02/26 0451) Weight:  [98.2 kg-99.1 kg] 99.1 kg (02/26 0453)     Height: '4\' 11"'$  (149.9 cm) Weight: 99.1 kg BMI (Calculated): 43.04   Intake/Output last 2 shifts:  02/25 0701 - 02/26 0700 In: 1241.3 [P.O.:120; I.V.:1121.3] Out: 150 [Drains:150]   Physical Exam:  Constitutional: alert, cooperative and no distress  Respiratory: breathing non-labored at rest  Cardiovascular: regular rate and sinus rhythm  Gastrointestinal: Soft, rather unchanged suprapubic tenderness (mild), non-distended, no rebound/guarding. Integumentary: Midline wound open, peritoneum closed; granulating; there are three stomatized areas visible in the LUQ and LLQ portions of the wound   Labs:     Latest Ref Rng & Units 10/30/2022   12:12 AM 10/23/2022   12:21 AM 10/17/2022    7:03 AM  CBC  WBC 4.0 - 10.5 K/uL 3.1  4.0  3.7   Hemoglobin 12.0 - 15.0 g/dL 8.1  8.2  8.0   Hematocrit 36.0  - 46.0 % 25.5  26.2  24.7   Platelets 150 - 400 K/uL 118  123  111       Latest Ref Rng & Units 10/30/2022    5:30 AM 10/27/2022    6:20 AM 10/23/2022    7:39 AM  CMP  Glucose 70 - 99 mg/dL 142  119  147   BUN 6 - 20 mg/dL '21  23  23   '$ Creatinine 0.44 - 1.00 mg/dL 0.42  0.38  0.41   Sodium 135 - 145 mmol/L 134  135  134   Potassium 3.5 - 5.1 mmol/L 4.3  4.5  4.3   Chloride 98 - 111 mmol/L 105  106  109   CO2 22 - 32 mmol/L '23  24  22   '$ Calcium 8.9 - 10.3 mg/dL 8.3  8.2  8.0   Total Protein 6.5 - 8.1 g/dL 6.0  6.0  6.2   Total Bilirubin 0.3 - 1.2 mg/dL 0.6  0.4  0.6   Alkaline Phos 38 - 126 U/L 134  143  137   AST 15 - 41 U/L 37  38  34   ALT 0 - 44 U/L 29  33  25     Imaging studies: No new pertinent imaging studies this AM   Assessment/Plan:  57 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   - New 02/26: Pending bi-weekly TPN labs; otherwise nothing acute; doing well considering   - Wound Care ([name] Pouch); Appreciate WOC RN assistance. No longer on intermittent or continuous suction. Patient seems to like this better and has started to manage effluent independently. She will need intermittent suction with Yankauer to control effluent. Likely be beneficial to suction before ambulation.    - Continue soft diet as tolerated  - Continue cyclic TPN; bi-weekly nutritional labs - Appreciate dietary assistance             - Monitor abdominal examination; on-going bowel function            - Pain control prn; antemetic prn            - Progressed with therapies; no longer any recommendations; ambulating well    - Appreciate neurosurgery assistance with compression fracture; serial XRs   - Discharge Planning: Continue to  anticipate lengthy admission +/- possible transfer; no new progress regarding disposition unfortunately   All of the above findings and recommendations were discussed with the patient, and the medical team, and all of patient's questions were answered to her expressed satisfaction.  -- [name], PA-C [facility] 11/03/2022, 7:19 AM M-F: 7am - 4pm

## 2022-11-04 LAB — GLUCOSE, CAPILLARY
Glucose-Capillary: 164 mg/dL — ABNORMAL HIGH (ref 70–99)
Glucose-Capillary: 112 mg/dL — ABNORMAL HIGH (ref 70–99)
Glucose-Capillary: 90 mg/dL (ref 70–99)

## 2022-11-04 MED ORDER — ZINC CHLORIDE 1 MG/ML IV SOLN
INTRAVENOUS | Status: AC
  Filled 2022-11-04: qty 672

## 2022-11-04 NOTE — Progress Notes (Signed)
Nutrition Follow-up  DOCUMENTATION CODES:   Obesity unspecified  INTERVENTION:   Continue cyclic TPN per pharmacy (16 hrs)- provides 1811kcal/day and 102g/day protein   Ensure Enlive po BID, each supplement provides 350 kcal and 20 grams of protein.  MVI, chromium and trace elements in TPN   Triglycerides checked monthly   Daily weights   Vitamin D2- 50,000 units po weekly   Zinc additional 33m daily added to TPN   Selenium additional 66m daily added to TPN   Vitamin labs next due 4/9- will include carnitine and aluminum.   NUTRITION DIAGNOSIS:   Increased nutrient needs related to wound healing, catabolic illness as evidenced by estimated needs. -ongoing   GOAL:   Patient will meet greater than or equal to 90% of their needs -met with TPN   MONITOR:   PO intake, Supplement acceptance, Labs, Weight trends, Diet advancement, I & O's, TPN  ASSESSMENT:   [age]/o female with h/o hypothyroidism, COVID 19 (3/21), kidney stones and stage 3 colon cancer (s/p left hemicolectomy 5/21 and chemotherapy) who is admitted for new pelvic mass now s/p laparotomy 6/8 (with excision of pelvic mass from greater omentum, abdominal wall reconstruction with bilateral myocutaneous flaps and mesh, incisional hernia repair, appendectomy repair and VAC placement) complicated by bowel perforation s/p reopening of recent laparotomy 6/10 (with repair of small bowel perforation, excision of mesh, placement of two phasix mesh and VAC placement). Pathology returned as metastatic adenocarcinoma. Pt with L1 compression fracture.   Pt continues to tolerate TPN well at goal rate; TPN is currently being cycled for 16 hrs. Triglycerides wnl and are being checked monthly. Hyperglycemia improved with insulin in TPN. Volume status being monitored; pt up ~21lbs since admission. Will check routine vitamin labs every 2-3 months as pt on chronic TPN (next due 4/9). Pt continues to have poor oral intake; pt eating  <25% of meals. Pt on soft diet. [name] pouch with 250 ccs + unmeasured. No discharge plan in place.   Medications reviewed and include: D2, insulin, synthroid, protonix, TPN  -Selenium 75(L), Chromium- 2.4(H), copper- 105 wnl, Manganese- 13.1 wnl, zinc- 64 wnl, vitamin D 34.2 wnl, Iodine 74.2 wnl- 2/8 -Iron 23(L), TIBC 364, ferritin 17 wnl, transferrin 261 wnl, folate 14.5 wnl, B12 585- 2/8 -Vitamin B1- 75.8, vitamin A- 27.1, B6- 21.9 wnl, vitamin C- 0.7, vitamin E- 11.6 wnl, vitamin K- 1.03 wnl- 2/8  Labs reviewed: Na 134(L), K 4.7 wnl, BUN 24(H), P 3.1 wnl, Mg 2.1 wnl- 2/26 Triglycerides- 90- 2/5 Wbc- 3.1(L), Hgb 8.1(L), Hct 25.5(L)- 2/22 Cbgs- 112, 164 x 24 hrs  Diet Order:    Diet Order             DIET SOFT Room service appropriate? Yes; Fluid consistency: Thin  Diet effective now                  EDUCATION NEEDS:   Not appropriate for education at this time  Skin:  Skin Assessment: Reviewed RN Assessment ( Midline wound: 11cm x 12cm )  Last BM:  2/27- type 7  Height:   Ht Readings from Last 1 Encounters:  03/01/22 4' 11"$  (1.499 m)    Weight:   Wt Readings from Last 1 Encounters:  11/03/22 99.1 kg    Ideal Body Weight:  44.3 kg  BMI:  Body mass index is 44.13 kg/m.  Estimated Nutritional Needs:   Kcal:  1800-2100kcal/day  Protein:  90-110g/d  Fluid:  1.4-1.6L/day  CaKoleen DistanceS, RD, LDN Please  refer to [facility] for RD and/or RD on-call/weekend/after hours pager 

## 2022-11-04 NOTE — Progress Notes (Signed)
Changed patients Eakins Pouch 2xs this shift. Pouch is still leaking. Notified Smiley Houseman the on call [place] nurse. Awaiting response.

## 2022-11-04 NOTE — Progress Notes (Signed)
[facility] SURGICAL PROGRESS NOTE (cpt [phone])  Hospital Day(s): 264.   Post op day(s): 262 Days Post-Op.   Interval History:  Patient seen and examined No acute issues overnight  Doing well; no acute changes No new labs this AM [name] output 250 ccs + unmeasured; managing effluent independently On TPN; On Soft diet She is ambulating better; reviewed mobility notes   Review of Systems:  Constitutional: denies fever, chills  HEENT: denies cough or congestion  Respiratory: denies any shortness of breath  Cardiovascular: denies chest pain or palpitations  Gastrointestinal: denied abdominal pain, denies N/V Genitourinary: denies burning with urination or urinary frequency Integumentary: + midline wound (stable)   Vital signs in last 24 hours: [min-max] current  Temp:  [98.1 F (36.7 C)-99 F (37.2 C)] 99 F (37.2 C) (02/27 0420) Pulse Rate:  [72-84] 84 (02/27 0420) Resp:  [15-20] 20 (02/27 0420) BP: (92-105)/(51-56) 98/51 (02/27 0420) SpO2:  [100 %] 100 % (02/27 0420)     Height: '4\' 11"'$  (149.9 cm) Weight: 99.1 kg BMI (Calculated): 43.04   Intake/Output last 2 shifts:  02/26 0701 - 02/27 0700 In: 1632.7 [P.O.:120; I.V.:1512.7] Out: 250 [Drains:250]   Physical Exam:  Constitutional: alert, cooperative and no distress  Respiratory: breathing non-labored at rest  Cardiovascular: regular rate and sinus rhythm  Gastrointestinal: Soft, rather unchanged suprapubic tenderness (mild), non-distended, no rebound/guarding. Integumentary: Midline wound open, peritoneum closed; granulating; there are three stomatized areas visible in the LUQ and LLQ portions of the wound   Labs:     Latest Ref Rng & Units 10/30/2022   12:12 AM 10/23/2022   12:21 AM 10/17/2022    7:03 AM  CBC  WBC 4.0 - 10.5 K/uL 3.1  4.0  3.7   Hemoglobin 12.0 - 15.0 g/dL 8.1  8.2  8.0   Hematocrit 36.0 - 46.0 % 25.5  26.2  24.7   Platelets 150 - 400 K/uL 118  123  111       Latest Ref Rng &  Units 11/03/2022    7:53 AM 10/30/2022    5:30 AM 10/27/2022    6:20 AM  CMP  Glucose 70 - 99 mg/dL 145  142  119   BUN 6 - 20 mg/dL '24  21  23   '$ Creatinine 0.44 - 1.00 mg/dL 0.44  0.42  0.38   Sodium 135 - 145 mmol/L 134  134  135   Potassium 3.5 - 5.1 mmol/L 4.7  4.3  4.5   Chloride 98 - 111 mmol/L 106  105  106   CO2 22 - 32 mmol/L '23  23  24   '$ Calcium 8.9 - 10.3 mg/dL 8.2  8.3  8.2   Total Protein 6.5 - 8.1 g/dL 6.1  6.0  6.0   Total Bilirubin 0.3 - 1.2 mg/dL 0.5  0.6  0.4   Alkaline Phos 38 - 126 U/L 150  134  143   AST 15 - 41 U/L 38  37  38   ALT 0 - 44 U/L 31  29  33     Imaging studies: No new pertinent imaging studies this AM   Assessment/Plan:  57 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   - New 02/27: No acute issues this morning  - Wound Care (  [name] Pouch); Appreciate WOC RN assistance. No longer on intermittent or continuous suction. Patient seems to like this better and has started to manage effluent independently. She will need intermittent suction with Yankauer to control effluent. Likely be beneficial to suction before ambulation.    - Continue soft diet as tolerated  - Continue cyclic TPN; bi-weekly nutritional labs - Appreciate dietary assistance             - Monitor abdominal examination; on-going bowel function            - Pain control prn; antemetic prn            - Progressed with therapies; no longer any recommendations; ambulating well    - Appreciate neurosurgery assistance with compression fracture; serial XRs   - Discharge Planning: Continue to anticipate lengthy admission +/- possible transfer; no new progress regarding disposition unfortunately   All of the above findings and  recommendations were discussed with the patient, and the medical team, and all of patient's questions were answered to her expressed satisfaction.  -- [name], PA-C [facility] 11/04/2022, 7:42 AM M-F: 7am - 4pm

## 2022-11-04 NOTE — Consult Note (Addendum)
[place] Nurse wound follow up Wound type:EC fistula. Bedside RN has changed pouching over fistula (3 stomatized fistulae) site x2 today. Requested for assistance with pouch change. Drainage (amount, consistency, odor) Effluent is yellow, seedy. Periwound:intact from 7-5 o'clock/ Partial thickness skin loss from 5-7 o'clock, irritant contact dermatitis.  ICD-10 CM Codes for Irritant Dermatitis  L24A9 - Due to friction or contact with other specified body fluids L24B1 - Related to digestive stoma or fistula  Leaking pouch is removed and skin cleansed. Area of irritant contact dermatitis is dusted with stoma powder, excess is brushed sway. Skin barrier rings are used to protect periphery of wound. [name] pouching system with access window is applied and briefly attached to suction to enhance seal. After approximately 3-4 minutes, enough time for me to apply a 1-inch paper tape window framing to pouching system periphery, negative pressure (suction) is discontinued. I have asked patient to remain in bed for 1 hour post pouch placement to assure a seal. She is in agreement. Pouching system is dated for communication of wear time.  [facility] nursing team will not follow, but will remain available to this patient, the nursing and medical teams.  Please re-consult if needed.  Thank you for inviting [name] to participate in this patient's Plan of Care.  Maudie Flakes, MSN, RN, CNS, [place], Serita Grammes, [employer], [employer] phone:  [phone]

## 2022-11-04 NOTE — Progress Notes (Signed)
PHARMACY - TOTAL PARENTERAL NUTRITION CONSULT NOTE   Indication: Prolonged ileus  Patient Measurements: Height: '4\' 11"'$  (149.9 cm) Weight: 99.1 kg (218 lb 7.6 oz) IBW/kg (Calculated) : 43.2 TPN AdjBW (KG): 55.2 Body mass index is 44.13 kg/m.  Assessment: Debra [name] is a 57 y.o. female s/p laparotomy, excision of greater omental mass, abdominal wall reconstruction with [name], appendectomy, and placement of Prevena vac.  Glucose / Insulin:  --No apparent history of diabetes --CBG checks 4x/day (0500, 1300, 1800, 2200) --BG controlled on current regimen --rSSI 4x/day (0500, 1300, 1800, 2200) --SSI last 24h: 4u  --17 units insulin in TPN (decreased from 20 units on 1/21) Electrolytes: Within normal limits Renal: SCr stable at baseline  Hepatic: Liver function overall within normal limits GI Imaging: 9/11 CTAP: no new acute issues GI Surgeries / Procedures: s/p laparotomy, excision of greater omental mass, abdominal wall reconstruction with [name], appendectomy, and placement of Prevena vac  Central access: 02/15/22 TPN start date: 02/15/22  Nutritional Goals: Goal cyclic TPN over 16 hrs: cyclic TPN over 16 hrs: (provides 102 g of protein and 1811 kcals per day) total volume over 16hrs for calculations=1400 ml (1500 ml total with overfill)  RD Assessment:  Estimated Needs Total Energy Estimated Needs: 1800-2100kcal/day Total Protein Estimated Needs: 90-110g/d Total Fluid Estimated Needs: 1.4-1.6L/day  Current Nutrition:  Soft diet + nutritional supplements, not meeting PO needs yet   Plan: Transitioned to *Cyclic* TPN on [id number].   to run over 16 hours per MD request. [name] at 2000 per discussion with dietician to allow patient time for walking in afternoon/evenings.  To run over 16 hours: -Start rate at 49 mL/hr for 1 hour. -Increase rate to 98 mL/hr for 13 hours.  -Decrease rate to 49 mL/hr for 1 hour. -Decrease rate to 25 mL/hr for  1 hour, then stop.  Plan:  See cyclic TPN rate above Continue Nutritional Components Amino acids (using 15% Clinisol): 101 g Dextrose 19% = 266 g Lipids (using 20% SMOFlipids): 50.4 g kCal: 1817/24h  Electrolytes in TPN: Na 67mq/L, K 411m/L, Ca 81m24mL, Mg 10 mEq/L, Phos 10 mmol/L.  Continue ratio Cl:Ac 1:1  Continue trace elements, chromium, zinc, and selenium per dietary recs. Continue CBG/SSI for Cyclic TPN:  -CBG 2 hrs after cyclic TPN start -CBG during middle of cyclic TPN infusion -CBG 1 hr after cylic TPN stopped -CBG while off TPN Continue resistant SSI and insulin in TPN at 17 units Check TPN labs on Mon/Thurs at minimum  [name]27/2024 7:33 AM

## 2022-11-05 LAB — GLUCOSE, CAPILLARY
Glucose-Capillary: 104 mg/dL — ABNORMAL HIGH (ref 70–99)
Glucose-Capillary: 155 mg/dL — ABNORMAL HIGH (ref 70–99)
Glucose-Capillary: 134 mg/dL — ABNORMAL HIGH (ref 70–99)
Glucose-Capillary: 103 mg/dL — ABNORMAL HIGH (ref 70–99)
Glucose-Capillary: 173 mg/dL — ABNORMAL HIGH (ref 70–99)

## 2022-11-05 MED ORDER — ZINC CHLORIDE 1 MG/ML IV SOLN
INTRAVENOUS | Status: AC
  Filled 2022-11-05: qty 672

## 2022-11-05 NOTE — Progress Notes (Signed)
[facility] SURGICAL PROGRESS NOTE (cpt [id number])  Hospital Day(s): 265.   Post op day(s): 263 Days Post-Op.   Interval History:  Patient seen and examined No acute issues overnight  Doing well; no acute changes [name] changed 02/27 after leaking No new labs this AM [name] output 250 ccs + unmeasured; managing effluent independently On TPN; On Soft diet She is ambulating better; reviewed mobility notes   Review of Systems:  Constitutional: denies fever, chills  HEENT: denies cough or congestion  Respiratory: denies any shortness of breath  Cardiovascular: denies chest pain or palpitations  Gastrointestinal: denied abdominal pain, denies N/V Genitourinary: denies burning with urination or urinary frequency Integumentary: + midline wound (stable)   Vital signs in last 24 hours: [min-max] current  Temp:  [98.1 F (36.7 C)-99.2 F (37.3 C)] 99 F (37.2 C) (02/28 0821) Pulse Rate:  [70-87] 84 (02/28 0821) Resp:  [14-18] 16 (02/28 0821) BP: (94-109)/(55-61) 107/61 (02/28 0821) SpO2:  [98 %-100 %] 98 % (02/28 0821) Weight:  [94.5 kg] 94.5 kg (02/28 0724)     Height: '4\' 11"'$  (149.9 cm) Weight: 94.5 kg BMI (Calculated): 43.04   Intake/Output last 2 shifts:  02/27 0701 - 02/28 0700 In: 764.6 [I.V.:764.6] Out: 200 [Drains:200]   Physical Exam:  Constitutional: alert, cooperative and no distress  Respiratory: breathing non-labored at rest  Cardiovascular: regular rate and sinus rhythm  Gastrointestinal: Soft, rather unchanged suprapubic tenderness (mild), non-distended, no rebound/guarding. Integumentary: Midline wound open, peritoneum closed; granulating; there are three stomatized areas visible in the LUQ and LLQ portions of the wound   Labs:     Latest Ref Rng & Units 10/30/2022   12:12 AM 10/23/2022   12:21 AM 10/17/2022    7:03 AM  CBC  WBC 4.0 - 10.5 K/uL 3.1  4.0  3.7   Hemoglobin 12.0 - 15.0 g/dL 8.1  8.2  8.0   Hematocrit 36.0 - 46.0 % 25.5  26.2  24.7    Platelets 150 - 400 K/uL 118  123  111       Latest Ref Rng & Units 11/03/2022    7:53 AM 10/30/2022    5:30 AM 10/27/2022    6:20 AM  CMP  Glucose 70 - 99 mg/dL 145  142  119   BUN 6 - 20 mg/dL '24  21  23   '$ Creatinine 0.44 - 1.00 mg/dL 0.44  0.42  0.38   Sodium 135 - 145 mmol/L 134  134  135   Potassium 3.5 - 5.1 mmol/L 4.7  4.3  4.5   Chloride 98 - 111 mmol/L 106  105  106   CO2 22 - 32 mmol/L '23  23  24   '$ Calcium 8.9 - 10.3 mg/dL 8.2  8.3  8.2   Total Protein 6.5 - 8.1 g/dL 6.1  6.0  6.0   Total Bilirubin 0.3 - 1.2 mg/dL 0.5  0.6  0.4   Alkaline Phos 38 - 126 U/L 150  134  143   AST 15 - 41 U/L 38  37  38   ALT 0 - 44 U/L 31  29  33     Imaging studies: No new pertinent imaging studies this AM   Assessment/Plan:  57 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   -  New 02/28: [name] changed 02/27 with [place]; appreciate help; no issues otherwise   - Wound Care ([name] Pouch); Appreciate WOC RN assistance. No longer on intermittent or continuous suction. Patient seems to like this better and has started to manage effluent independently. She will need intermittent suction with Yankauer to control effluent. Likely be beneficial to suction before ambulation.    - Continue soft diet as tolerated  - Continue cyclic TPN; bi-weekly nutritional labs - Appreciate dietary assistance             - Monitor abdominal examination; on-going bowel function            - Pain control prn; antemetic prn            - Progressed with therapies; no longer any recommendations; ambulating well    - Appreciate neurosurgery assistance with compression fracture; serial XRs   - Discharge Planning: Continue to anticipate lengthy admission +/-  possible transfer; no new progress regarding disposition unfortunately   All of the above findings and recommendations were discussed with the patient, and the medical team, and all of patient's questions were answered to her expressed satisfaction.  -- [name], PA-C [facility] 11/05/2022, 9:55 AM M-F: 7am - 4pm

## 2022-11-05 NOTE — Progress Notes (Signed)
PHARMACY - TOTAL PARENTERAL NUTRITION CONSULT NOTE   Indication: Prolonged ileus  Patient Measurements: Height: '4\' 11"'$  (149.9 cm) Weight: 99.1 kg (218 lb 7.6 oz) IBW/kg (Calculated) : 43.2 TPN AdjBW (KG): 55.2 Body mass index is 44.13 kg/m.  Assessment: Debra [name] is a 57 y.o. female s/p laparotomy, excision of greater omental mass, abdominal wall reconstruction with [name], appendectomy, and placement of Prevena vac.  Glucose / Insulin:  --No apparent history of diabetes --CBG checks 4x/day (0500, 1300, 1800, 2200) --BG controlled on current regimen --rSSI 4x/day (0500, 1300, 1800, 2200) --SSI last 24h: 4u  --17 units insulin in TPN (decreased from 20 units on 1/21) Electrolytes: Within normal limits Renal: SCr stable at baseline  Hepatic: Liver function overall within normal limits GI Imaging: 9/11 CTAP: no new acute issues GI Surgeries / Procedures: s/p laparotomy, excision of greater omental mass, abdominal wall reconstruction with [name], appendectomy, and placement of Prevena vac  Central access: 02/15/22 TPN start date: 02/15/22  Nutritional Goals: Goal cyclic TPN over 16 hrs: cyclic TPN over 16 hrs: (provides 102 g of protein and 1811 kcals per day) total volume over 16hrs for calculations=1400 ml (1500 ml total with overfill)  RD Assessment:  Estimated Needs Total Energy Estimated Needs: 1800-2100kcal/day Total Protein Estimated Needs: 90-110g/d Total Fluid Estimated Needs: 1.4-1.6L/day  Current Nutrition:  Soft diet + nutritional supplements, not meeting PO needs yet   Plan: Transitioned to *Cyclic* TPN on [id number].   to run over 16 hours per MD request. [name] at 2000 per discussion with dietician to allow patient time for walking in afternoon/evenings.  To run over 16 hours: -Start rate at 49 mL/hr for 1 hour. -Increase rate to 98 mL/hr for 13 hours.  -Decrease rate to 49 mL/hr for 1 hour. -Decrease rate to 25 mL/hr for  1 hour, then stop.  Plan:  See cyclic TPN rate above Continue Nutritional Components Amino acids (using 15% Clinisol): 101 g Dextrose 19% = 266 g Lipids (using 20% SMOFlipids): 50.4 g kCal: 1817/24h  Electrolytes in TPN: Na 52mq/L, K 422m/L, Ca 29m11mL, Mg 10 mEq/L, Phos 10 mmol/L  Continue ratio Cl:Ac 1:1  Continue trace elements, chromium, zinc, and selenium per dietary recs. Continue CBG/SSI for Cyclic TPN:  -CBG 2 hrs after cyclic TPN start -CBG during middle of cyclic TPN infusion -CBG 1 hr after cylic TPN stopped -CBG while off TPN Continue resistant SSI and insulin in TPN at 17 units Check TPN labs on Mon/Thurs  [name]28/2024 7:30 AM

## 2022-11-06 DIAGNOSIS — Z9889 Other specified postprocedural states: Secondary | ICD-10-CM

## 2022-11-06 LAB — COMPREHENSIVE METABOLIC PANEL
ALT: 31 U/L (ref 0–44)
AST: 42 U/L — ABNORMAL HIGH (ref 15–41)
Albumin: 2.8 g/dL — ABNORMAL LOW (ref 3.5–5.0)
Alkaline Phosphatase: 159 U/L — ABNORMAL HIGH (ref 38–126)
Anion gap: 6 (ref 5–15)
BUN: 21 mg/dL — ABNORMAL HIGH (ref 6–20)
CO2: 22 mmol/L (ref 22–32)
Calcium: 8.4 mg/dL — ABNORMAL LOW (ref 8.9–10.3)
Chloride: 109 mmol/L (ref 98–111)
Creatinine, Ser: 0.46 mg/dL (ref 0.44–1.00)
GFR, Estimated: >60 mL/min (ref >60)
Glucose, Bld: 124 mg/dL — ABNORMAL HIGH (ref 70–99)
Potassium: 3.8 mmol/L (ref 3.5–5.1)
Sodium: 137 mmol/L (ref 135–145)
Total Bilirubin: 0.5 mg/dL (ref 0.3–1.2)
Total Protein: 6.7 g/dL (ref 6.5–8.1)

## 2022-11-06 LAB — MAGNESIUM: Magnesium: 2.1 mg/dL (ref 1.7–2.4)

## 2022-11-06 LAB — PHOSPHORUS: Phosphorus: 3.9 mg/dL (ref 2.5–4.6)

## 2022-11-06 LAB — GLUCOSE, CAPILLARY
Glucose-Capillary: 124 mg/dL — ABNORMAL HIGH (ref 70–99)
Glucose-Capillary: 109 mg/dL — ABNORMAL HIGH (ref 70–99)
Glucose-Capillary: 122 mg/dL — ABNORMAL HIGH (ref 70–99)
Glucose-Capillary: 97 mg/dL (ref 70–99)

## 2022-11-06 MED ORDER — ZINC CHLORIDE 1 MG/ML IV SOLN
INTRAVENOUS | Status: AC
  Filled 2022-11-06: qty 672

## 2022-11-06 MED ORDER — ALTEPLASE 2 MG IJ SOLR: 2.0000 mg | Freq: Once | INTRAMUSCULAR | Status: DC

## 2022-11-06 NOTE — Progress Notes (Addendum)
[facility] SURGICAL PROGRESS NOTE (cpt [phone])  Hospital Day(s): 266.   Post op day(s): 264 Days Post-Op.   Interval History:  Patient seen and examined No acute issues overnight  Doing well; mild discomfort in RLQ Nutritional labs reassuring  [name] output 500 ccs + unmeasured; managing effluent independently On TPN; On Soft diet She is ambulating better; reviewed mobility notes   Review of Systems:  Constitutional: denies fever, chills  HEENT: denies cough or congestion  Respiratory: denies any shortness of breath  Cardiovascular: denies chest pain or palpitations  Gastrointestinal: denied abdominal pain, denies N/V Genitourinary: denies burning with urination or urinary frequency Integumentary: + midline wound (stable)   Vital signs in last 24 hours: [min-max] current  Temp:  [98 F (36.7 C)-99 F (37.2 C)] 98 F (36.7 C) (02/29 0430) Pulse Rate:  [76-84] 76 (02/29 0430) Resp:  [16-18] 18 (02/29 0430) BP: (94-107)/(44-61) 105/54 (02/29 0430) SpO2:  [98 %-100 %] 99 % (02/29 0430) Weight:  [95 kg] 95 kg (02/29 0500)     Height: '4\' 11"'$  (149.9 cm) Weight: 95 kg BMI (Calculated): 43.04   Intake/Output last 2 shifts:  02/28 0701 - 02/29 0700 In: 938.5 [P.O.:240; I.V.:698.5] Out: 500 [Stool:500]   Physical Exam:  Constitutional: alert, cooperative and no distress  Respiratory: breathing non-labored at rest  Cardiovascular: regular rate and sinus rhythm  Gastrointestinal: Soft, mild soreness in RLQ, non-distended, no rebound/guarding. Integumentary: Midline wound open, peritoneum closed; granulating; there are three stomatized areas visible in the LUQ and LLQ portions of the wound   Labs:     Latest Ref Rng & Units 10/30/2022   12:12 AM 10/23/2022   12:21 AM 10/17/2022    7:03 AM  CBC  WBC 4.0 - 10.5 K/uL 3.1  4.0  3.7   Hemoglobin 12.0 - 15.0 g/dL 8.1  8.2  8.0   Hematocrit 36.0 - 46.0 % 25.5  26.2  24.7   Platelets 150 - 400 K/uL 118  123  111        Latest Ref Rng & Units 11/06/2022    3:57 AM 11/03/2022    7:53 AM 10/30/2022    5:30 AM  CMP  Glucose 70 - 99 mg/dL 124  145  142   BUN 6 - 20 mg/dL '21  24  21   '$ Creatinine 0.44 - 1.00 mg/dL 0.46  0.44  0.42   Sodium 135 - 145 mmol/L 137  134  134   Potassium 3.5 - 5.1 mmol/L 3.8  4.7  4.3   Chloride 98 - 111 mmol/L 109  106  105   CO2 22 - 32 mmol/L '22  23  23   '$ Calcium 8.9 - 10.3 mg/dL 8.4  8.2  8.3   Total Protein 6.5 - 8.1 g/dL 6.7  6.1  6.0   Total Bilirubin 0.3 - 1.2 mg/dL 0.5  0.5  0.6   Alkaline Phos 38 - 126 U/L 159  150  134   AST 15 - 41 U/L 42  38  37   ALT 0 - 44 U/L '31  31  29     '$ Imaging studies: No new pertinent imaging studies this AM   Assessment/Plan:  57 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   - New 02/29: Mild  RLQ soreness, worked up extensively in the pat. Otherwise, no issues. No [name] leak.   - Wound Care ([name] Pouch); Appreciate WOC RN assistance. No longer on intermittent or continuous suction. Patient seems to like this better and has started to manage effluent independently. She will need intermittent suction with Yankauer to control effluent. Likely be beneficial to suction before ambulation.    - Continue soft diet as tolerated  - Continue cyclic TPN; bi-weekly nutritional labs - Appreciate dietary assistance             - Monitor abdominal examination; on-going bowel function            - Pain control prn; antemetic prn            - Progressed with therapies; no longer any recommendations; ambulating well    - Appreciate neurosurgery assistance with compression fracture; serial XRs   - Discharge Planning: Continue to anticipate lengthy admission +/- possible transfer; no  new progress regarding disposition unfortunately   All of the above findings and recommendations were discussed with the patient, and the medical team, and all of patient's questions were answered to her expressed satisfaction.  -- [name], PA-C [facility] 11/06/2022, 8:09 AM M-F: 7am - 4pm

## 2022-11-06 NOTE — Progress Notes (Signed)
PHARMACY - TOTAL PARENTERAL NUTRITION CONSULT NOTE   Indication: Prolonged ileus  Patient Measurements: Height: '4\' 11"'$  (149.9 cm) Weight: 95 kg (209 lb 7 oz) IBW/kg (Calculated) : 43.2 TPN AdjBW (KG): 55.2 Body mass index is 42.3 kg/m.  Assessment: Debra [name] is a 57 y.o. female s/p laparotomy, excision of greater omental mass, abdominal wall reconstruction with [name], appendectomy, and placement of Prevena vac.  Glucose / Insulin:  --No apparent history of diabetes --CBG checks 4x/day (0500, 1300, 1800, 2200) --BG controlled on current regimen --rSSI 4x/day (0500, 1300, 1800, 2200) --SSI last 24h: 11u  --17 units insulin in TPN (decreased from 20 units on 1/21) Electrolytes: Within normal limits Renal: SCr stable at baseline  Hepatic: Liver function overall within normal limits GI Imaging: 9/11 CTAP: no new acute issues GI Surgeries / Procedures: s/p laparotomy, excision of greater omental mass, abdominal wall reconstruction with [name], appendectomy, and placement of Prevena vac  Central access: 02/15/22 TPN start date: 02/15/22  Nutritional Goals: Goal cyclic TPN over 16 hrs: cyclic TPN over 16 hrs: (provides 102 g of protein and 1811 kcals per day) total volume over 16hrs for calculations=1400 ml (1500 ml total with overfill)  RD Assessment:  Estimated Needs Total Energy Estimated Needs: 1800-2100kcal/day Total Protein Estimated Needs: 90-110g/d Total Fluid Estimated Needs: 1.4-1.6L/day  Current Nutrition:  Soft diet + nutritional supplements, not meeting PO needs yet   Plan: Transitioned to *Cyclic* TPN on [id number].   to run over 16 hours per MD request. [name] at 2000 per discussion with dietician to allow patient time for walking in afternoon/evenings.  To run over 16 hours: -Start rate at 49 mL/hr for 1 hour. -Increase rate to 98 mL/hr for 13 hours.  -Decrease rate to 49 mL/hr for 1 hour. -Decrease rate to 25 mL/hr for 1  hour, then stop.  Plan:  See cyclic TPN rate above Continue Nutritional Components Amino acids (using 15% Clinisol): 101 g Dextrose 19% = 266 g Lipids (using 20% SMOFlipids): 50.4 g kCal: 1817/24h  Electrolytes in TPN: Na 37mq/L, K 451m/L, Ca 29m23mL, Mg 10 mEq/L, Phos 10 mmol/L  Continue ratio Cl:Ac 1:1  Continue trace elements, chromium, zinc, and selenium per dietary recs. Continue CBG/SSI for Cyclic TPN:  -CBG 2 hrs after cyclic TPN start -CBG during middle of cyclic TPN infusion -CBG 1 hr after cylic TPN stopped -CBG while off TPN Continue resistant SSI and insulin in TPN at 17 units Check TPN labs on Mon/Thurs  [name]29/2024 7:38 AM

## 2022-11-07 LAB — GLUCOSE, CAPILLARY
Glucose-Capillary: 159 mg/dL — ABNORMAL HIGH (ref 70–99)
Glucose-Capillary: 117 mg/dL — ABNORMAL HIGH (ref 70–99)
Glucose-Capillary: 108 mg/dL — ABNORMAL HIGH (ref 70–99)

## 2022-11-07 MED ORDER — ZINC CHLORIDE 1 MG/ML IV SOLN
INTRAVENOUS | Status: AC
  Filled 2022-11-07: qty 672

## 2022-11-07 NOTE — Progress Notes (Signed)
PHARMACY - TOTAL PARENTERAL NUTRITION CONSULT NOTE   Indication: Prolonged ileus  Patient Measurements: Height: '4\' 11"'$  (149.9 cm) Weight: 99.9 kg (220 lb 3.8 oz) IBW/kg (Calculated) : 43.2 TPN AdjBW (KG): 55.2 Body mass index is 44.48 kg/m.  Assessment: Debra [name] is a 57 y.o. female s/p laparotomy, excision of greater omental mass, abdominal wall reconstruction with [name], appendectomy, and placement of Prevena vac.  Glucose / Insulin:  --No apparent history of diabetes --CBG checks 4x/day (0500, 1300, 1800, 2200) --BG controlled on current regimen --rSSI 4x/day (0500, 1300, 1800, 2200) --SSI last 24h: 7u  --17 units insulin in TPN (decreased from 20 units on 1/21) Electrolytes: Within normal limits Renal: SCr stable at baseline  Hepatic: Liver function overall within normal limits GI Imaging: 9/11 CTAP: no new acute issues GI Surgeries / Procedures: s/p laparotomy, excision of greater omental mass, abdominal wall reconstruction with [name], appendectomy, and placement of Prevena vac  Central access: 02/15/22 TPN start date: 02/15/22  Nutritional Goals: Goal cyclic TPN over 16 hrs: cyclic TPN over 16 hrs: (provides 102 g of protein and 1811 kcals per day) total volume over 16hrs for calculations=1400 ml (1500 ml total with overfill)  RD Assessment:  Estimated Needs Total Energy Estimated Needs: 1800-2100kcal/day Total Protein Estimated Needs: 90-110g/d Total Fluid Estimated Needs: 1.4-1.6L/day  Current Nutrition:  Soft diet + nutritional supplements, not meeting PO needs yet   Plan: Transitioned to *Cyclic* TPN on [id number].   to run over 16 hours per MD request. [name] at 2000 per discussion with dietician to allow patient time for walking in afternoon/evenings.  To run over 16 hours: -Start rate at 49 mL/hr for 1 hour. -Increase rate to 98 mL/hr for 13 hours.  -Decrease rate to 49 mL/hr for 1 hour. -Decrease rate to 25 mL/hr for  1 hour, then stop.  Plan:  See cyclic TPN rate above Continue Nutritional Components Amino acids (using 15% Clinisol): 101 g Dextrose 19% = 266 g Lipids (using 20% SMOFlipids): 50.4 g kCal: 1817/24h  Electrolytes in TPN: Na 61mq/L, K 468m/L, Ca 51m26mL, Mg 10 mEq/L, Phos 10 mmol/L  Continue ratio Cl:Ac 1:1  Continue trace elements, chromium, zinc, and selenium per dietary recs. Continue CBG/SSI for Cyclic TPN:  -CBG 2 hrs after cyclic TPN start -CBG during middle of cyclic TPN infusion -CBG 1 hr after cylic TPN stopped -CBG while off TPN Continue resistant SSI and insulin in TPN at 17 units Check TPN labs on Mon/Thurs  Talitha Dicarlo A 11/07/2022 9:23 AM

## 2022-11-07 NOTE — Progress Notes (Signed)
[facility] SURGICAL PROGRESS NOTE (cpt [id number])  Hospital Day(s): 267.   Post op day(s): 265 Days Post-Op.   Interval History:  Patient seen and examined Overnight, seemed there was issues with clotting of the PICC as well as some clots from [name] Otherwise doing fine [name] output unmeasured; managing effluent independently On TPN; On Soft diet She is ambulating better; reviewed mobility notes   Review of Systems:  Constitutional: denies fever, chills  HEENT: denies cough or congestion  Respiratory: denies any shortness of breath  Cardiovascular: denies chest pain or palpitations  Gastrointestinal: denied abdominal pain, denies N/V Genitourinary: denies burning with urination or urinary frequency Integumentary: + midline wound (stable)   Vital signs in last 24 hours: [min-max] current  Temp:  [98.1 F (36.7 C)-98.6 F (37 C)] 98.3 F (36.8 C) (03/01 0415) Pulse Rate:  [76-103] 86 (03/01 0415) Resp:  [16-19] 19 (03/01 0415) BP: (97-107)/(49-60) 103/58 (03/01 0415) SpO2:  [97 %-100 %] 97 % (03/01 0415)     Height: '4\' 11"'$  (149.9 cm) Weight: 95 kg BMI (Calculated): 43.04   Intake/Output last 2 shifts:  02/29 0701 - 03/01 0700 In: 1380.7 [I.V.:1380.7] Out: -    Physical Exam:  Constitutional: alert, cooperative and no distress  Respiratory: breathing non-labored at rest  Cardiovascular: regular rate and sinus rhythm  Gastrointestinal: Soft, mild soreness in RLQ, non-distended, no rebound/guarding. Integumentary: Midline wound open, peritoneum closed; granulating; there are three stomatized areas visible in the LUQ and LLQ portions of the wound. No active bleeding.   Labs:     Latest Ref Rng & Units 10/30/2022   12:12 AM 10/23/2022   12:21 AM 10/17/2022    7:03 AM  CBC  WBC 4.0 - 10.5 K/uL 3.1  4.0  3.7   Hemoglobin 12.0 - 15.0 g/dL 8.1  8.2  8.0   Hematocrit 36.0 - 46.0 % 25.5  26.2  24.7   Platelets 150 - 400 K/uL 118  123  111       Latest Ref Rng  & Units 11/06/2022    3:57 AM 11/03/2022    7:53 AM 10/30/2022    5:30 AM  CMP  Glucose 70 - 99 mg/dL 124  145  142   BUN 6 - 20 mg/dL '21  24  21   '$ Creatinine 0.44 - 1.00 mg/dL 0.46  0.44  0.42   Sodium 135 - 145 mmol/L 137  134  134   Potassium 3.5 - 5.1 mmol/L 3.8  4.7  4.3   Chloride 98 - 111 mmol/L 109  106  105   CO2 22 - 32 mmol/L '22  23  23   '$ Calcium 8.9 - 10.3 mg/dL 8.4  8.2  8.3   Total Protein 6.5 - 8.1 g/dL 6.7  6.1  6.0   Total Bilirubin 0.3 - 1.2 mg/dL 0.5  0.5  0.6   Alkaline Phos 38 - 126 U/L 159  150  134   AST 15 - 41 U/L 42  38  37   ALT 0 - 44 U/L '31  31  29     '$ Imaging studies: No new pertinent imaging studies this AM   Assessment/Plan:  57 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   - New 03/01: Will consult IV team to  assess PICC; monitor [name] output; no active source of bleeding   - Wound Care ([name] Pouch); Appreciate WOC RN assistance. No longer on intermittent or continuous suction. Patient seems to like this better and has started to manage effluent independently. She will need intermittent suction with Yankauer to control effluent. Likely be beneficial to suction before ambulation.    - Continue soft diet as tolerated  - Continue cyclic TPN; bi-weekly nutritional labs - Appreciate dietary assistance             - Monitor abdominal examination; on-going bowel function            - Pain control prn; antemetic prn            - Progressed with therapies; no longer any recommendations; ambulating well    - Appreciate neurosurgery assistance with compression fracture; serial XRs   - Discharge Planning: Continue to anticipate lengthy admission +/- possible transfer; no new progress regarding  disposition unfortunately   All of the above findings and recommendations were discussed with the patient, and the medical team, and all of patient's questions were answered to her expressed satisfaction.  -- Debra Simon, PA-C [facility] 11/07/2022, 7:25 AM M-F: 7am - 4pm

## 2022-11-08 LAB — CBC
HCT: 26.3 % — ABNORMAL LOW (ref 36.0–46.0)
Hemoglobin: 8.1 g/dL — ABNORMAL LOW (ref 12.0–15.0)
MCH: 29.3 pg (ref 26.0–34.0)
MCHC: 30.8 g/dL (ref 30.0–36.0)
MCV: 95.3 fL (ref 80.0–100.0)
Platelets: 120 10*3/uL — ABNORMAL LOW (ref 150–400)
RBC: 2.76 MIL/uL — ABNORMAL LOW (ref 3.87–5.11)
RDW: 20.3 % — ABNORMAL HIGH (ref 11.5–15.5)
WBC: 3.7 10*3/uL — ABNORMAL LOW (ref 4.0–10.5)
nRBC: 0 % (ref 0.0–0.2)

## 2022-11-08 MED ORDER — ZINC CHLORIDE 1 MG/ML IV SOLN
INTRAVENOUS | Status: AC
  Filled 2022-11-08: qty 672

## 2022-11-08 NOTE — Progress Notes (Signed)
Subjective:  CC: Debra [name] is a 57 y.o. female  Hospital stay day 268, 266 Days Post-Op s/p re-opening of laparotomy for repair of small bowel perforation following initial laparotomy, excision of greater omental mass, abdominal wall reconstruction with [name], appendectomy, and placement of Prevena vac on 06/08.   HPI: No issues reported overnight  ROS:  General: Denies weight loss, weight gain, fatigue, fevers, chills, and night sweats. Heart: Denies chest pain, palpitations, racing heart, irregular heartbeat, leg pain or swelling, and decreased activity tolerance. Respiratory: Denies breathing difficulty, shortness of breath, wheezing, cough, and sputum. GI: Denies change in appetite, heartburn, nausea, vomiting, constipation, diarrhea, and blood in stool. GU: Denies difficulty urinating, pain with urinating, urgency, frequency, blood in urine.   Objective:   Temp:  [98.1 F (36.7 C)-98.4 F (36.9 C)] 98.2 F (36.8 C) (03/02 0803) Pulse Rate:  [80-100] 87 (03/02 0803) Resp:  [15-18] 15 (03/02 0803) BP: (97-114)/(50-61) 106/54 (03/02 0803) SpO2:  [96 %-100 %] 96 % (03/02 0803) Weight:  [99.9 kg] 99.9 kg (03/02 0500)     Height: '4\' 11"'$  (149.9 cm) Weight: 99.9 kg BMI (Calculated): 43.04   Intake/Output this shift:   Intake/Output Summary (Last 24 hours) at 11/08/2022 1026 Last data filed at 11/08/2022 0950 Gross per 24 hour  Intake 1122.76 ml  Output --  Net 1122.76 ml    Constitutional :  alert, cooperative, appears stated age, and no distress  Respiratory:  clear to auscultation bilaterally  Cardiovascular:  regular rate and rhythm  Gastrointestinal: soft, non-tender; bowel sounds normal; no masses,  no organomegaly.   Skin: Cool and moist.  [name]'s pouch intact.  No signs of active bleeding or old blood clots within bag this morning.  Productive stool noted.  Psychiatric: Normal affect, non-agitated, not confused       LABS:     Latest Ref Rng &  Units 11/06/2022    3:57 AM 11/03/2022    7:53 AM 10/30/2022    5:30 AM  CMP  Glucose 70 - 99 mg/dL 124  145  142   BUN 6 - 20 mg/dL '21  24  21   '$ Creatinine 0.44 - 1.00 mg/dL 0.46  0.44  0.42   Sodium 135 - 145 mmol/L 137  134  134   Potassium 3.5 - 5.1 mmol/L 3.8  4.7  4.3   Chloride 98 - 111 mmol/L 109  106  105   CO2 22 - 32 mmol/L '22  23  23   '$ Calcium 8.9 - 10.3 mg/dL 8.4  8.2  8.3   Total Protein 6.5 - 8.1 g/dL 6.7  6.1  6.0   Total Bilirubin 0.3 - 1.2 mg/dL 0.5  0.5  0.6   Alkaline Phos 38 - 126 U/L 159  150  134   AST 15 - 41 U/L 42  38  37   ALT 0 - 44 U/L '31  31  29       '$ Latest Ref Rng & Units 11/08/2022    4:45 AM 10/30/2022   12:12 AM 10/23/2022   12:21 AM  CBC  WBC 4.0 - 10.5 K/uL 3.7  3.1  4.0   Hemoglobin 12.0 - 15.0 g/dL 8.1  8.1  8.2   Hematocrit 36.0 - 46.0 % 26.3  25.5  26.2   Platelets 150 - 400 K/uL 120  118  123     RADS: N/a Assessment:   s/p re-opening of laparotomy for repair of small bowel perforation following  initial laparotomy, excision of greater omental mass, abdominal wall reconstruction with [name], appendectomy, and placement of Prevena vac on 06/08.   Stable.  Continue current management  labs/images/medications/previous chart entries reviewed personally and relevant changes/updates noted above.

## 2022-11-08 NOTE — Progress Notes (Signed)
PHARMACY - TOTAL PARENTERAL NUTRITION CONSULT NOTE   Indication: Prolonged ileus  Patient Measurements: Height: '4\' 11"'$  (149.9 cm) Weight: 99.9 kg (220 lb 3.8 oz) IBW/kg (Calculated) : 43.2 TPN AdjBW (KG): 55.2 Body mass index is 44.48 kg/m.  Assessment: Debra [name] is a 57 y.o. female s/p laparotomy, excision of greater omental mass, abdominal wall reconstruction with Debra [name] release, appendectomy, and placement of Prevena vac.  Glucose / Insulin:  --No apparent history of diabetes --CBG checks 4x/day (0500, 1300, 1800, 2200) --BG controlled on current regimen --rSSI 4x/day (0500, 1300, 1800, 2200) --SSI last 24h: 4u  --17 units insulin in TPN (decreased from 20 units on 1/21) Electrolytes: Within normal limits Renal: SCr stable at baseline  Hepatic: Liver function overall within normal limits GI Imaging: 9/11 CTAP: no new acute issues GI Surgeries / Procedures: s/p laparotomy, excision of greater omental mass, abdominal wall reconstruction with Debra [name] release, appendectomy, and placement of Prevena vac  Central access: 02/15/22 TPN start date: 02/15/22  Nutritional Goals: Goal cyclic TPN over 16 hrs: cyclic TPN over 16 hrs: (provides 102 g of protein and 1811 kcals per day) total volume over 16hrs for calculations=1400 ml (1500 ml total with overfill)  RD Assessment:  Estimated Needs Total Energy Estimated Needs: 1800-2100kcal/day Total Protein Estimated Needs: 90-110g/d Total Fluid Estimated Needs: 1.4-1.6L/day  Current Nutrition:  Soft diet + nutritional supplements, not meeting PO needs yet   Plan: Transitioned to *Cyclic* TPN on [id number].   to run over 16 hours per MD request. Debra [name] at 2000 per discussion with dietician to allow patient time for walking in afternoon/evenings.  To run over 16 hours: -Start rate at 49 mL/hr for 1 hour. -Increase rate to 98 mL/hr for 13 hours.  -Decrease rate to 49 mL/hr for 1 hour. -Decrease rate to 25 mL/hr for  1 hour, then stop.  Plan:  See cyclic TPN rate above Continue Nutritional Components Amino acids (using 15% Clinisol): 101 g Dextrose 19% = 266 g Lipids (using 20% SMOFlipids): 50.4 g kCal: 1817/24h  Electrolytes in TPN: Na 100mq/L, K 470m/L, Ca 43m20mL, Mg 10 mEq/L, Phos 10 mmol/L  Continue ratio Cl:Ac 1:1  Continue trace elements, chromium, zinc, and selenium per dietary recs. Continue CBG/SSI for Cyclic TPN:  -CBG 2 hrs after cyclic TPN start -CBG during middle of cyclic TPN infusion -CBG 1 hr after cylic TPN stopped -CBG while off TPN Continue resistant SSI and insulin in TPN at 17 units Check TPN labs on Mon/Thurs  [name]2/2024 8:58 AM

## 2022-11-09 LAB — GLUCOSE, CAPILLARY
Glucose-Capillary: 132 mg/dL — ABNORMAL HIGH (ref 70–99)
Glucose-Capillary: 129 mg/dL — ABNORMAL HIGH (ref 70–99)
Glucose-Capillary: 134 mg/dL — ABNORMAL HIGH (ref 70–99)

## 2022-11-09 MED ORDER — ZINC CHLORIDE 1 MG/ML IV SOLN
INTRAVENOUS | Status: AC
  Filled 2022-11-09: qty 672

## 2022-11-09 NOTE — Progress Notes (Signed)
PHARMACY - TOTAL PARENTERAL NUTRITION CONSULT NOTE   Indication: Prolonged ileus  Patient Measurements: Height: '4\' 11"'$  (149.9 cm) Weight: 100.1 kg (220 lb 10.9 oz) IBW/kg (Calculated) : 43.2 TPN AdjBW (KG): 55.2 Body mass index is 44.57 kg/m.  Assessment: Debra [name] is a 57 y.o. female s/p laparotomy, excision of greater omental mass, abdominal wall reconstruction with Debra [name] release, appendectomy, and placement of Prevena vac.  Glucose / Insulin:  --No apparent history of diabetes --CBG checks 4x/day (0500, 1300, 1800, 2200) --BG controlled on current regimen --rSSI 4x/day (0500, 1300, 1800, 2200) --SSI last 24h: 11u  --17 units insulin in TPN (decreased from 20 units on 1/21) Electrolytes: Within normal limits Renal: SCr stable at baseline  Hepatic: Liver function overall within normal limits GI Imaging: 9/11 CTAP: no new acute issues GI Surgeries / Procedures: s/p laparotomy, excision of greater omental mass, abdominal wall reconstruction with Debra [name] release, appendectomy, and placement of Prevena vac  Central access: 02/15/22 TPN start date: 02/15/22  Nutritional Goals: Goal cyclic TPN over 16 hrs: cyclic TPN over 16 hrs: (provides 102 g of protein and 1811 kcals per day) total volume over 16hrs for calculations=1400 ml (1500 ml total with overfill)  RD Assessment:  Estimated Needs Total Energy Estimated Needs: 1800-2100kcal/day Total Protein Estimated Needs: 90-110g/d Total Fluid Estimated Needs: 1.4-1.6L/day  Current Nutrition:  Soft diet + nutritional supplements, not meeting PO needs yet   Plan: Transitioned to *Cyclic* TPN on [id number].   to run over 16 hours per MD request. Debra [name] at 2000 per discussion with dietician to allow patient time for walking in afternoon/evenings.  To run over 16 hours: -Start rate at 49 mL/hr for 1 hour. -Increase rate to 98 mL/hr for 13 hours.  -Decrease rate to 49 mL/hr for 1 hour. -Decrease rate to 25 mL/hr  for 1 hour, then stop.  Plan:  See cyclic TPN rate above Continue Nutritional Components Amino acids (using 15% Clinisol): 101 g Dextrose 19% = 266 g Lipids (using 20% SMOFlipids): 50.4 g kCal: 1817/24h  Electrolytes in TPN: Na 52mq/L, K 474m/L, Ca 78m72mL, Mg 10 mEq/L, Phos 10 mmol/L  Continue ratio Cl:Ac 1:1  Continue trace elements, chromium, zinc, and selenium per dietary recs. Continue CBG/SSI for Cyclic TPN:  -CBG 2 hrs after cyclic TPN start -CBG during middle of cyclic TPN infusion -CBG 1 hr after cylic TPN stopped -CBG while off TPN Continue resistant SSI and insulin in TPN at 17 units Check TPN labs on Mon/Thurs  [name]3/2024 8:38 AM

## 2022-11-09 NOTE — Progress Notes (Signed)
Subjective:  CC: Debra [name] is a 57 y.o. female  Hospital stay day 269, 267 Days Post-Op s/p re-opening of laparotomy for repair of small bowel perforation following initial laparotomy, excision of greater omental mass, abdominal wall reconstruction with [name], appendectomy, and placement of Prevena vac on 02/13/22.   HPI: No issues reported overnight  ROS:  General: Denies weight loss, weight gain, fatigue, fevers, chills, and night sweats. Heart: Denies chest pain, palpitations, racing heart, irregular heartbeat, leg pain or swelling, and decreased activity tolerance. Respiratory: Denies breathing difficulty, shortness of breath, wheezing, cough, and sputum. GI: Denies change in appetite, heartburn, nausea, vomiting, constipation, diarrhea, and blood in stool. GU: Denies difficulty urinating, pain with urinating, urgency, frequency, blood in urine.   Objective:   Temp:  [98 F (36.7 C)-98.9 F (37.2 C)] 98.9 F (37.2 C) (03/03 0337) Pulse Rate:  [85-102] 102 (03/03 0337) Resp:  [15-20] 20 (03/03 0337) BP: (106-137)/(54-91) 130/59 (03/03 0337) SpO2:  [96 %-100 %] 99 % (03/03 0337) Weight:  [100.1 kg] 100.1 kg (03/03 0500)     Height: '4\' 11"'$  (149.9 cm) Weight: 100.1 kg BMI (Calculated): 43.04   Intake/Output this shift:   Intake/Output Summary (Last 24 hours) at 11/09/2022 0740 Last data filed at 11/08/2022 2000 Gross per 24 hour  Intake 120 ml  Output 350 ml  Net -230 ml    Constitutional :  alert, cooperative, appears stated age, and no distress  Respiratory:  clear to auscultation bilaterally  Cardiovascular:  regular rate and rhythm  Gastrointestinal: soft, non-tender; bowel sounds normal; no masses,  no organomegaly.   Skin: Cool and moist.  [name]'s pouch intact.  No signs of active bleeding or blood clots within bag this morning.  Productive stool noted.  Psychiatric: Normal affect, non-agitated, not confused       LABS:     Latest Ref Rng &  Units 11/06/2022    3:57 AM 11/03/2022    7:53 AM 10/30/2022    5:30 AM  CMP  Glucose 70 - 99 mg/dL 124  145  142   BUN 6 - 20 mg/dL '21  24  21   '$ Creatinine 0.44 - 1.00 mg/dL 0.46  0.44  0.42   Sodium 135 - 145 mmol/L 137  134  134   Potassium 3.5 - 5.1 mmol/L 3.8  4.7  4.3   Chloride 98 - 111 mmol/L 109  106  105   CO2 22 - 32 mmol/L '22  23  23   '$ Calcium 8.9 - 10.3 mg/dL 8.4  8.2  8.3   Total Protein 6.5 - 8.1 g/dL 6.7  6.1  6.0   Total Bilirubin 0.3 - 1.2 mg/dL 0.5  0.5  0.6   Alkaline Phos 38 - 126 U/L 159  150  134   AST 15 - 41 U/L 42  38  37   ALT 0 - 44 U/L '31  31  29       '$ Latest Ref Rng & Units 11/08/2022    4:45 AM 10/30/2022   12:12 AM 10/23/2022   12:21 AM  CBC  WBC 4.0 - 10.5 K/uL 3.7  3.1  4.0   Hemoglobin 12.0 - 15.0 g/dL 8.1  8.1  8.2   Hematocrit 36.0 - 46.0 % 26.3  25.5  26.2   Platelets 150 - 400 K/uL 120  118  123     RADS: N/a Assessment:   s/p re-opening of laparotomy for repair of small bowel perforation following  initial laparotomy, excision of greater omental mass, abdominal wall reconstruction with [name], appendectomy, and placement of Prevena vac on 02/13/22.   Continues to be stable.  Will continue current management for now.  labs/images/medications/previous chart entries reviewed personally and relevant changes/updates noted above.

## 2022-11-10 LAB — COMPREHENSIVE METABOLIC PANEL
ALT: 32 U/L (ref 0–44)
AST: 42 U/L — ABNORMAL HIGH (ref 15–41)
Albumin: 2.4 g/dL — ABNORMAL LOW (ref 3.5–5.0)
Alkaline Phosphatase: 115 U/L (ref 38–126)
Anion gap: 5 (ref 5–15)
BUN: 21 mg/dL — ABNORMAL HIGH (ref 6–20)
CO2: 23 mmol/L (ref 22–32)
Calcium: 8 mg/dL — ABNORMAL LOW (ref 8.9–10.3)
Chloride: 106 mmol/L (ref 98–111)
Creatinine, Ser: 0.45 mg/dL (ref 0.44–1.00)
GFR, Estimated: >60 mL/min (ref >60)
Glucose, Bld: 152 mg/dL — ABNORMAL HIGH (ref 70–99)
Potassium: 4.3 mmol/L (ref 3.5–5.1)
Sodium: 134 mmol/L — ABNORMAL LOW (ref 135–145)
Total Bilirubin: 0.5 mg/dL (ref 0.3–1.2)
Total Protein: 5.7 g/dL — ABNORMAL LOW (ref 6.5–8.1)

## 2022-11-10 LAB — PHOSPHORUS: Phosphorus: 3 mg/dL (ref 2.5–4.6)

## 2022-11-10 LAB — GLUCOSE, CAPILLARY
Glucose-Capillary: 93 mg/dL (ref 70–99)
Glucose-Capillary: 155 mg/dL — ABNORMAL HIGH (ref 70–99)
Glucose-Capillary: 139 mg/dL — ABNORMAL HIGH (ref 70–99)
Glucose-Capillary: 116 mg/dL — ABNORMAL HIGH (ref 70–99)
Glucose-Capillary: 168 mg/dL — ABNORMAL HIGH (ref 70–99)
Glucose-Capillary: 164 mg/dL — ABNORMAL HIGH (ref 70–99)
Glucose-Capillary: 184 mg/dL — ABNORMAL HIGH (ref 70–99)
Glucose-Capillary: 123 mg/dL — ABNORMAL HIGH (ref 70–99)
Glucose-Capillary: 106 mg/dL — ABNORMAL HIGH (ref 70–99)

## 2022-11-10 LAB — MAGNESIUM: Magnesium: 1.9 mg/dL (ref 1.7–2.4)

## 2022-11-10 LAB — TRIGLYCERIDES: Triglycerides: 129 mg/dL (ref <150)

## 2022-11-10 MED ORDER — ZINC CHLORIDE 1 MG/ML IV SOLN
INTRAVENOUS | Status: AC
  Filled 2022-11-10: qty 672

## 2022-11-10 NOTE — Progress Notes (Signed)
[facility] SURGICAL PROGRESS NOTE (cpt [phone])  Hospital Day(s): 270.   Post op day(s): 268 Days Post-Op.   Interval History:  Patient seen and examined No issues overnight nor over the weekend Otherwise doing fine; minimal RLQ discomfort occasionally  Nutritional labs pending this AM [name] output unmeasured; managing effluent independently On TPN; On Soft diet She is ambulating better; reviewed mobility notes   Review of Systems:  Constitutional: denies fever, chills  HEENT: denies cough or congestion  Respiratory: denies any shortness of breath  Cardiovascular: denies chest pain or palpitations  Gastrointestinal: denied abdominal pain, denies N/V Genitourinary: denies burning with urination or urinary frequency Integumentary: + midline wound (stable)   Vital signs in last 24 hours: [min-max] current  Temp:  [98.4 F (36.9 C)-98.9 F (37.2 C)] 98.9 F (37.2 C) (03/04 0443) Pulse Rate:  [79-85] 83 (03/04 0443) Resp:  [16-20] 16 (03/04 0443) BP: (82-110)/(53-59) 108/53 (03/04 0443) SpO2:  [97 %-100 %] 98 % (03/04 0443) Weight:  [98.9 kg] 98.9 kg (03/04 0500)     Height: '4\' 11"'$  (149.9 cm) Weight: 98.9 kg BMI (Calculated): 43.04   Intake/Output last 2 shifts:  03/03 0701 - 03/04 0700 In: 853.2 [I.V.:853.2] Out: 50 [Drains:50]   Physical Exam:  Constitutional: alert, cooperative and no distress  Respiratory: breathing non-labored at rest  Cardiovascular: regular rate and sinus rhythm  Gastrointestinal: Soft, mild soreness in RLQ, non-distended, no rebound/guarding. Integumentary: Midline wound open, peritoneum closed; granulating; there are three stomatized areas visible in the LUQ and LLQ portions of the wound. No active bleeding.   Labs:     Latest Ref Rng & Units 11/08/2022    4:45 AM 10/30/2022   12:12 AM 10/23/2022   12:21 AM  CBC  WBC 4.0 - 10.5 K/uL 3.7  3.1  4.0   Hemoglobin 12.0 - 15.0 g/dL 8.1  8.1  8.2   Hematocrit 36.0 - 46.0 % 26.3   25.5  26.2   Platelets 150 - 400 K/uL 120  118  123       Latest Ref Rng & Units 11/06/2022    3:57 AM 11/03/2022    7:53 AM 10/30/2022    5:30 AM  CMP  Glucose 70 - 99 mg/dL 124  145  142   BUN 6 - 20 mg/dL '21  24  21   '$ Creatinine 0.44 - 1.00 mg/dL 0.46  0.44  0.42   Sodium 135 - 145 mmol/L 137  134  134   Potassium 3.5 - 5.1 mmol/L 3.8  4.7  4.3   Chloride 98 - 111 mmol/L 109  106  105   CO2 22 - 32 mmol/L '22  23  23   '$ Calcium 8.9 - 10.3 mg/dL 8.4  8.2  8.3   Total Protein 6.5 - 8.1 g/dL 6.7  6.1  6.0   Total Bilirubin 0.3 - 1.2 mg/dL 0.5  0.5  0.6   Alkaline Phos 38 - 126 U/L 159  150  134   AST 15 - 41 U/L 42  38  37   ALT 0 - 44 U/L '31  31  29     '$ Imaging studies: No new pertinent imaging studies this AM   Assessment/Plan:  57 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   -  New 03/04: No new issues; Bi-weekly labs pending   - Wound Care ([name] Pouch); Appreciate WOC RN assistance. No longer on intermittent or continuous suction. Patient seems to like this better and has started to manage effluent independently. She will need intermittent suction with Yankauer to control effluent. Likely be beneficial to suction before ambulation.    - Continue soft diet as tolerated  - Continue cyclic TPN; bi-weekly nutritional labs - Appreciate dietary assistance             - Monitor abdominal examination; on-going bowel function            - Pain control prn; antemetic prn            - Progressed with therapies; no longer any recommendations; ambulating well    - Appreciate neurosurgery assistance with compression fracture; serial XRs   - Discharge Planning: Continue to anticipate lengthy admission +/- possible transfer;  no new progress regarding disposition unfortunately   All of the above findings and recommendations were discussed with the patient, and the medical team, and all of patient's questions were answered to her expressed satisfaction.  -- [name], PA-C [facility] 11/10/2022, 7:33 AM M-F: 7am - 4pm

## 2022-11-10 NOTE — Progress Notes (Signed)
PHARMACY - TOTAL PARENTERAL NUTRITION CONSULT NOTE   Indication: Prolonged ileus  Patient Measurements: Height: '4\' 11"'$  (149.9 cm) Weight: 98.9 kg (218 lb 0.6 oz) IBW/kg (Calculated) : 43.2 TPN AdjBW (KG): 55.2 Body mass index is 44.04 kg/m.  Assessment: Debra [name] is a 57 y.o. female s/p laparotomy, excision of greater omental mass, abdominal wall reconstruction with [name], appendectomy, and placement of Prevena vac.  Glucose / Insulin:  --No apparent history of diabetes --CBG checks 4x/day (0500, 1300, 1800, 2200) --BG controlled on current regimen --rSSI 4x/day (0500, 1300, 1800, 2200) --SSI last 24h: 13u  --17 units insulin in TPN (decreased from 20 units on 1/21) Electrolytes: Within normal limits Renal: SCr stable at baseline  Hepatic: Liver function overall within normal limits GI Imaging: 9/11 CTAP: no new acute issues GI Surgeries / Procedures: s/p laparotomy, excision of greater omental mass, abdominal wall reconstruction with [name], appendectomy, and placement of Prevena vac  Central access: 02/15/22 TPN start date: 02/15/22  Nutritional Goals: Goal cyclic TPN over 16 hrs: cyclic TPN over 16 hrs: (provides 102 g of protein and 1811 kcals per day) total volume over 16hrs for calculations=1400 ml (1500 ml total with overfill)  RD Assessment:  Estimated Needs Total Energy Estimated Needs: 1800-2100kcal/day Total Protein Estimated Needs: 90-110g/d Total Fluid Estimated Needs: 1.4-1.6L/day  Current Nutrition:  Soft diet + nutritional supplements, not meeting PO needs yet   Plan: Transitioned to *Cyclic* TPN on [id number].   to run over 16 hours per MD request. [name] at 2000 per discussion with dietician to allow patient time for walking in afternoon/evenings.  To run over 16 hours: -Start rate at 49 mL/hr for 1 hour. -Increase rate to 98 mL/hr for 13 hours.  -Decrease rate to 49 mL/hr for 1 hour. -Decrease rate to 25 mL/hr for  1 hour, then stop.  Plan:  See cyclic TPN rate above Continue Nutritional Components Amino acids (using 15% Clinisol): 101 g Dextrose 19% = 266 g Lipids (using 20% SMOFlipids): 50.4 g kCal: 1817/24h  Electrolytes in TPN: Na 28mq/L, K 492m/L, Ca 54m70mL, Mg 10 mEq/L, Phos 10 mmol/L  Continue ratio Cl:Ac 1:1  Continue trace elements, chromium, zinc, and selenium per dietary recs. Continue CBG/SSI for Cyclic TPN:  -CBG 2 hrs after cyclic TPN start -CBG during middle of cyclic TPN infusion -CBG 1 hr after cylic TPN stopped -CBG while off TPN Continue resistant SSI and insulin in TPN at 17 units Check TPN labs on Mon/Thurs  [name]4/2024 7:03 AM

## 2022-11-11 LAB — GLUCOSE, CAPILLARY
Glucose-Capillary: 157 mg/dL — ABNORMAL HIGH (ref 70–99)
Glucose-Capillary: 167 mg/dL — ABNORMAL HIGH (ref 70–99)
Glucose-Capillary: 128 mg/dL — ABNORMAL HIGH (ref 70–99)
Glucose-Capillary: 91 mg/dL (ref 70–99)
Glucose-Capillary: 146 mg/dL — ABNORMAL HIGH (ref 70–99)

## 2022-11-11 MED ORDER — ZINC CHLORIDE 1 MG/ML IV SOLN
INTRAVENOUS | Status: AC
  Filled 2022-11-11: qty 672

## 2022-11-11 NOTE — Progress Notes (Signed)
[facility] SURGICAL PROGRESS NOTE (cpt [phone])  Hospital Day(s): 271.   Post op day(s): 269 Days Post-Op.   Interval History:  Patient seen and examined No issues overnight No new complaints - sleeping well this morning  Nutritional labs yesterday (03/04) are reassuring  [name] output unmeasured; managing effluent independently On TPN; On Soft diet She is ambulating better; reviewed mobility notes   Review of Systems:  Constitutional: denies fever, chills  HEENT: denies cough or congestion  Respiratory: denies any shortness of breath  Cardiovascular: denies chest pain or palpitations  Gastrointestinal: denied abdominal pain, denies N/V Genitourinary: denies burning with urination or urinary frequency Integumentary: + midline wound (stable)   Vital signs in last 24 hours: [min-max] current  Temp:  [98 F (36.7 C)-98.5 F (36.9 C)] 98.5 F (36.9 C) (03/05 0424) Pulse Rate:  [70-100] 86 (03/05 0424) Resp:  [15-18] 18 (03/05 0424) BP: (98-122)/(57-75) 111/58 (03/05 0424) SpO2:  [97 %-100 %] 97 % (03/05 0424) Weight:  [97.9 kg] 97.9 kg (03/05 0424)     Height: '4\' 11"'$  (149.9 cm) Weight: 97.9 kg BMI (Calculated): 43.04   Intake/Output last 2 shifts:  03/04 0701 - 03/05 0700 In: 864.6 [I.V.:864.6] Out: 50 [Drains:50]   Physical Exam:  Constitutional: alert, cooperative and no distress  Respiratory: breathing non-labored at rest  Cardiovascular: regular rate and sinus rhythm  Gastrointestinal: Soft, non-tender, non-distended, no rebound/guarding. Integumentary: Midline wound open, peritoneum closed; granulating; there are three stomatized areas visible in the LUQ and LLQ portions of the wound. No active bleeding.   Labs:     Latest Ref Rng & Units 11/08/2022    4:45 AM 10/30/2022   12:12 AM 10/23/2022   12:21 AM  CBC  WBC 4.0 - 10.5 K/uL 3.7  3.1  4.0   Hemoglobin 12.0 - 15.0 g/dL 8.1  8.1  8.2   Hematocrit 36.0 - 46.0 % 26.3  25.5  26.2   Platelets 150  - 400 K/uL 120  118  123       Latest Ref Rng & Units 11/10/2022    7:10 AM 11/06/2022    3:57 AM 11/03/2022    7:53 AM  CMP  Glucose 70 - 99 mg/dL 152  124  145   BUN 6 - 20 mg/dL '21  21  24   '$ Creatinine 0.44 - 1.00 mg/dL 0.45  0.46  0.44   Sodium 135 - 145 mmol/L 134  137  134   Potassium 3.5 - 5.1 mmol/L 4.3  3.8  4.7   Chloride 98 - 111 mmol/L 106  109  106   CO2 22 - 32 mmol/L '23  22  23   '$ Calcium 8.9 - 10.3 mg/dL 8.0  8.4  8.2   Total Protein 6.5 - 8.1 g/dL 5.7  6.7  6.1   Total Bilirubin 0.3 - 1.2 mg/dL 0.5  0.5  0.5   Alkaline Phos 38 - 126 U/L 115  159  150   AST 15 - 41 U/L 42  42  38   ALT 0 - 44 U/L 32  31  31     Imaging studies: No new pertinent imaging studies this AM   Assessment/Plan:  57 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   - New 03/05: No  new issues  - Wound Care ([name] Pouch); Appreciate WOC RN assistance. No longer on intermittent or continuous suction. Patient seems to like this better and has started to manage effluent independently. She will need intermittent suction with Yankauer to control effluent. Likely be beneficial to suction before ambulation.    - Continue soft diet as tolerated  - Continue cyclic TPN; bi-weekly nutritional labs - Appreciate dietary assistance             - Monitor abdominal examination; on-going bowel function            - Pain control prn; antemetic prn            - Progressed with therapies; no longer any recommendations; ambulating well    - Appreciate neurosurgery assistance with compression fracture; serial XRs   - Discharge Planning: Continue to anticipate lengthy admission +/- possible transfer; no new progress regarding disposition unfortunately    All of the above findings and recommendations were discussed with the patient, and the medical team, and all of patient's questions were answered to her expressed satisfaction.  -- [name], PA-C [facility] 11/11/2022, 7:35 AM M-F: 7am - 4pm

## 2022-11-11 NOTE — Progress Notes (Addendum)
PHARMACY - TOTAL PARENTERAL NUTRITION CONSULT NOTE   Indication: Prolonged ileus  Patient Measurements: Height: '4\' 11"'$  (149.9 cm) Weight: 97.9 kg (215 lb 13.3 oz) IBW/kg (Calculated) : 43.2 TPN AdjBW (KG): 55.2 Body mass index is 43.59 kg/m.  Assessment: Debra [name] is a 57 y.o. female s/p laparotomy, excision of greater omental mass, abdominal wall reconstruction with [name], appendectomy, and placement of Prevena vac.  Glucose / Insulin:  --No apparent history of diabetes --CBG checks 4x/day (0500, 1300, 1800, 2200) --BG controlled on current regimen --rSSI 4x/day (0500, 1300, 1800, 2200) --SSI last 24h: 11u  --17 units insulin in TPN (decreased from 20 units on 1/21) Electrolytes: Within normal limits Renal: SCr stable at baseline  Hepatic: Liver function overall within normal limits GI Imaging: 9/11 CTAP: no new acute issues GI Surgeries / Procedures: s/p laparotomy, excision of greater omental mass, abdominal wall reconstruction with [name], appendectomy, and placement of Prevena vac  Central access: 02/15/22 TPN start date: 02/15/22  Nutritional Goals: Goal cyclic TPN over 16 hrs: cyclic TPN over 16 hrs: (provides 102 g of protein and 1811 kcals per day) total volume over 16hrs for calculations=1400 ml (1500 ml total with overfill)  RD Assessment:  Estimated Needs Total Energy Estimated Needs: 1800-2100kcal/day Total Protein Estimated Needs: 90-110g/d Total Fluid Estimated Needs: 1.4-1.6L/day  Current Nutrition:  Soft diet + nutritional supplements, not meeting PO needs yet   Plan: Transitioned to *Cyclic* TPN on [id number].   to run over 16 hours per MD request. [name] at 2000 per discussion with dietician to allow patient time for walking in afternoon/evenings.  To run over 16 hours: -Start rate at 49 mL/hr for 1 hour. -Increase rate to 98 mL/hr for 13 hours.  -Decrease rate to 49 mL/hr for 1 hour. -Decrease rate to 25 mL/hr  for 1 hour, then stop.  Plan:  See cyclic TPN rate above Continue Nutritional Components Amino acids (using 15% Clinisol): 101 g Dextrose 19% = 266 g Lipids (using 20% SMOFlipids): 50.4 g kCal: 1817/24h  Electrolytes in TPN: Na 23mq/L, K 488m/L, Ca 81m84mL, Mg 10 mEq/L, Phos 10 mmol/L  Continue ratio Cl:Ac 1:1  Continue trace elements, chromium, zinc, and selenium per dietary recs. Continue CBG/SSI for Cyclic TPN:  -CBG 2 hrs after cyclic TPN start -CBG during middle of cyclic TPN infusion -CBG 1 hr after cylic TPN stopped -CBG while off TPN Continue resistant SSI and insulin in TPN at 17 units Check TPN labs on Mondays/once weekly ISO stable labs  [name]5/2024 7:55 AM

## 2022-11-11 NOTE — Progress Notes (Signed)
Nutrition Follow-up  DOCUMENTATION CODES:   Obesity unspecified  INTERVENTION:   Continue cyclic TPN per pharmacy (16 hrs)- provides 1811kcal/day and 102g/day protein   Ensure Enlive po BID, each supplement provides 350 kcal and 20 grams of protein.  MVI, chromium and trace elements in TPN   Triglycerides checked monthly   Daily weights   Vitamin D2- 50,000 units po weekly   Zinc additional '5mg'$  daily added to TPN   Selenium additional 32mg daily added to TPN   Vitamin labs next due 4/9- will include carnitine and aluminum.   NUTRITION DIAGNOSIS:   Increased nutrient needs related to wound healing, catabolic illness as evidenced by estimated needs. -ongoing   GOAL:   Patient will meet greater than or equal to 90% of their needs -met with TPN   MONITOR:   PO intake, Supplement acceptance, Labs, Weight trends, Diet advancement, I & O's, TPN  ASSESSMENT:   57y/o female with h/o hypothyroidism, COVID 19 (3/21), kidney stones and stage 3 colon cancer (s/p left hemicolectomy 5/21 and chemotherapy) who is admitted for new pelvic mass now s/p laparotomy 6/8 (with excision of pelvic mass from greater omentum, abdominal wall reconstruction with bilateral myocutaneous flaps and mesh, incisional hernia repair, appendectomy repair and VAC placement) complicated by bowel perforation s/p reopening of recent laparotomy 6/10 (with repair of small bowel perforation, excision of mesh, placement of two phasix mesh and VAC placement). Pathology returned as metastatic adenocarcinoma. Pt with L1 compression fracture.   Pt continues to tolerate TPN well at goal rate; TPN is currently being cycled for 16 hrs. Triglycerides wnl and are being checked monthly. Hyperglycemia improved with insulin in TPN. Volume status being monitored; pt up ~15lbs since admission. Will check routine vitamin labs every 2-3 months as pt on chronic TPN (next due 4/9). Pt continues to have poor oral intake; pt eating  <25% of meals. Pt on soft diet. [name] pouch with 50 ccs + unmeasured. No discharge plan in place.   Medications reviewed and include: D2, insulin, synthroid, protonix, TPN  -Selenium 75(L), Chromium- 2.4(H), copper- 105 wnl, Manganese- 13.1 wnl, zinc- 64 wnl, vitamin D 34.2 wnl, Iodine 74.2 wnl- 2/8 -Iron 23(L), TIBC 364, ferritin 17 wnl, transferrin 261 wnl, folate 14.5 wnl, B12 585- 2/8 -Vitamin B1- 75.8, vitamin A- 27.1, B6- 21.9 wnl, vitamin C- 0.7, vitamin E- 11.6 wnl, vitamin K- 1.03 wnl- 2/8  Labs reviewed: Na 134(L), K 4.3 wnl, BUN 21(H), P 3.0 wnl, Mg 1.9 wnl- 3/4 Triglycerides- 129- 3/4 Wbc- 3.7(L), Hgb 8.1(L), Hct 26.3(L)- 3/2 Cbgs- 128, 167 x 24 hrs  Nutrition Focused Physical Exam:  Flowsheet Row Most Recent Value  Orbital Region No depletion  Upper Arm Region No depletion  Thoracic and Lumbar Region No depletion  Buccal Region No depletion  Temple Region No depletion  Clavicle Bone Region No depletion  Clavicle and Acromion Bone Region No depletion  Scapular Bone Region No depletion  Dorsal Hand No depletion  Patellar Region No depletion  Anterior Thigh Region No depletion  Posterior Calf Region No depletion  Edema (RD Assessment) Mild  Hair Reviewed  Eyes Reviewed  Mouth Reviewed  Skin Reviewed  Nails Reviewed   Diet Order:    Diet Order             DIET SOFT Room service appropriate? Yes; Fluid consistency: Thin  Diet effective now                  EDUCATION NEEDS:   Not  appropriate for education at this time  Skin:  Skin Assessment: Reviewed RN Assessment ( Midline wound: 11cm x 12cm )  Last BM:  3/4- type 7  Height:   Ht Readings from Last 1 Encounters:  03/01/22 '4\' 11"'$  (1.499 m)    Weight:   Wt Readings from Last 1 Encounters:  11/11/22 97.9 kg    Ideal Body Weight:  44.3 kg  BMI:  Body mass index is 43.59 kg/m.  Estimated Nutritional Needs:   Kcal:  1800-2100kcal/day  Protein:  90-110g/d  Fluid:  1.4-1.6L/day  Koleen Distance MS, RD, LDN Please refer to [facility] for RD and/or RD on-call/weekend/after hours pager

## 2022-11-12 LAB — GLUCOSE, CAPILLARY
Glucose-Capillary: 170 mg/dL — ABNORMAL HIGH (ref 70–99)
Glucose-Capillary: 135 mg/dL — ABNORMAL HIGH (ref 70–99)
Glucose-Capillary: 162 mg/dL — ABNORMAL HIGH (ref 70–99)

## 2022-11-12 MED ORDER — ZINC CHLORIDE 1 MG/ML IV SOLN
INTRAVENOUS | Status: AC
  Filled 2022-11-12: qty 672

## 2022-11-12 NOTE — Plan of Care (Signed)
Blood glucose was 170 at approximately 0450. Awaiting upload from glucometer.

## 2022-11-12 NOTE — Plan of Care (Signed)
  Problem: Clinical Measurements: Goal: Will remain free from infection Outcome: Progressing   Problem: Nutrition: Goal: Adequate nutrition will be maintained Outcome: Progressing   Problem: Activity: Goal: Risk for activity intolerance will decrease Outcome: Progressing   Problem: Nutrition: Goal: Adequate nutrition will be maintained Outcome: Progressing   Problem: Coping: Goal: Level of anxiety will decrease Outcome: Progressing   Problem: Pain Managment: Goal: General experience of comfort will improve Outcome: Progressing

## 2022-11-12 NOTE — Progress Notes (Signed)
[facility] SUR[facility]ICAL PROGRESS NOTE (cpt [phone])  Hospital Day(s): 272.   Post op day(s): 270 Days Post-Op.   Interval History:  Patient seen and examined No issues overnight No new labs   [name] output unmeasured; managing effluent independently On TPN; On Soft diet She is ambulating better; reviewed mobility notes   Review of Systems:  Constitutional: denies fever, chills  HEENT: denies cough or congestion  Respiratory: denies any shortness of breath  Cardiovascular: denies chest pain or palpitations  Gastrointestinal: denied abdominal pain, denies N/V Genitourinary: denies burning with urination or urinary frequency Integumentary: + midline wound (stable)   Vital signs in last 24 hours: [min-max] current  Temp:  [98 F (36.7 C)-98.8 F (37.1 C)] 98.6 F (37 C) (03/06 0750) Pulse Rate:  [71-84] 81 (03/06 0750) Resp:  [15-20] 18 (03/06 0750) BP: (98-111)/(55-77) 106/55 (03/06 0750) SpO2:  [92 %-100 %] 96 % (03/06 0750) Weight:  [98 kg] 98 kg (03/06 0454)     Height: '4\' 11"'$  (149.9 cm) Weight: 98 kg BMI (Calculated): 43.04   Intake/Output last 2 shifts:  03/05 0701 - 03/06 0700 In: 1500.3 [P.O.:600; I.V.:900.3] Out: -    Physical Exam:  Constitutional: alert, cooperative and no distress  Respiratory: breathing non-labored at rest  Cardiovascular: regular rate and sinus rhythm  Gastrointestinal: Soft, non-tender, non-distended, no rebound/guarding. Integumentary: Midline wound open, peritoneum closed; granulating; there are three stomatized areas visible in the LUQ and LLQ portions of the wound. No active bleeding.   Labs:     Latest Ref Rng & Units 11/08/2022    4:45 AM 10/30/2022   12:12 AM 10/23/2022   12:21 AM  CBC  WBC 4.0 - 10.5 K/uL 3.7  3.1  4.0   Hemoglobin 12.0 - 15.0 g/dL 8.1  8.1  8.2   Hematocrit 36.0 - 46.0 % 26.3  25.5  26.2   Platelets 150 - 400 K/uL 120  118  123       Latest Ref Rng & Units 11/10/2022    7:10 AM 11/06/2022    3:57  AM 11/03/2022    7:53 AM  CMP  Glucose 70 - 99 mg/dL 152  124  145   BUN 6 - 20 mg/dL '21  21  24   '$ Creatinine 0.44 - 1.00 mg/dL 0.45  0.46  0.44   Sodium 135 - 145 mmol/L 134  137  134   Potassium 3.5 - 5.1 mmol/L 4.3  3.8  4.7   Chloride 98 - 111 mmol/L 106  109  106   CO2 22 - 32 mmol/L '23  22  23   '$ Calcium 8.9 - 10.3 mg/dL 8.0  8.4  8.2   Total Protein 6.5 - 8.1 g/dL 5.7  6.7  6.1   Total Bilirubin 0.3 - 1.2 mg/dL 0.5  0.5  0.5   Alkaline Phos 38 - 126 U/L 115  159  150   AST 15 - 41 U/L 42  42  38   ALT 0 - 44 U/L 32  31  31     Imaging studies: No new pertinent imaging studies this AM   Assessment/Plan:  57 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   - New 03/06: No new issues - Did switch to once weekly labs for  TPN; discussed with pharmacy   - Wound Care ([name] Pouch); Appreciate WOC RN assistance. No longer on intermittent or continuous suction. Patient seems to like this better and has started to manage effluent independently. She will need intermittent suction with Yankauer to control effluent. Likely be beneficial to suction before ambulation.    - Continue soft diet as tolerated  - Continue cyclic TPN; bi-weekly nutritional labs - Appreciate dietary assistance             - Monitor abdominal examination; on-going bowel function            - Pain control prn; antemetic prn            - Progressed with therapies; no longer any recommendations; ambulating well    - Appreciate neurosurgery assistance with compression fracture; serial XRs   - Discharge Planning: Continue to anticipate lengthy admission +/- possible transfer; no new progress regarding disposition unfortunately   All of the above findings and  recommendations were discussed with the patient, and the medical team, and all of patient's questions were answered to her expressed satisfaction.  -- [name], PA-C [facility] 11/12/2022, 7:51 AM M-F: 7am - 4pm

## 2022-11-12 NOTE — Progress Notes (Signed)
PHARMACY - TOTAL PARENTERAL NUTRITION CONSULT NOTE   Indication: Prolonged ileus  Patient Measurements: Height: '4\' 11"'$  (149.9 cm) Weight: 98 kg (216 lb 0.8 oz) IBW/kg (Calculated) : 43.2 TPN AdjBW (KG): 55.2 Body mass index is 43.64 kg/m.  Assessment: Debra [name] is a 57 y.o. female s/p laparotomy, excision of greater omental mass, abdominal wall reconstruction with [name], appendectomy, and placement of Prevena vac.  Glucose / Insulin:  --No apparent history of diabetes --CBG checks 4x/day (0500, 1300, 1800, 2200) --BG controlled on current regimen --rSSI 4x/day (0500, 1300, 1800, 2200) --SSI last 24h: 10u  --17 units insulin in TPN (decreased from 20 units on 1/21) Electrolytes: Within normal limits Renal: SCr stable at baseline  Hepatic: Liver function overall within normal limits GI Imaging: 9/11 CTAP: no new acute issues GI Surgeries / Procedures: s/p laparotomy, excision of greater omental mass, abdominal wall reconstruction with [name], appendectomy, and placement of Prevena vac  Central access: 02/15/22 TPN start date: 02/15/22  Nutritional Goals: Goal cyclic TPN over 16 hrs: cyclic TPN over 16 hrs: (provides 102 g of protein and 1811 kcals per day) total volume over 16hrs for calculations=1400 ml (1500 ml total with overfill)  RD Assessment:  Estimated Needs Total Energy Estimated Needs: 1800-2100kcal/day Total Protein Estimated Needs: 90-110g/d Total Fluid Estimated Needs: 1.4-1.6L/day  Current Nutrition:  Soft diet + nutritional supplements, not meeting PO needs yet   Plan: Transitioned to *Cyclic* TPN on [id number].   to run over 16 hours per MD request. [name] at 2000 per discussion with dietician to allow patient time for walking in afternoon/evenings.  To run over 16 hours: -Start rate at 49 mL/hr for 1 hour. -Increase rate to 98 mL/hr for 13 hours.  -Decrease rate to 49 mL/hr for 1 hour. -Decrease rate to 25 mL/hr for 1  hour, then stop.  Plan:  See cyclic TPN rate above Continue Nutritional Components Amino acids (using 15% Clinisol): 101 g Dextrose 19% = 266 g Lipids (using 20% SMOFlipids): 50.4 g kCal: 1817/24h  Electrolytes in TPN: Na 74mq/L, K 46m/L, Ca 16m2mL, Mg 10 mEq/L, Phos 10 mmol/L  Continue ratio Cl:Ac 1:1  Continue trace elements, chromium, zinc, and selenium per dietary recs. Continue CBG/SSI for Cyclic TPN:  -CBG 2 hrs after cyclic TPN start -CBG during middle of cyclic TPN infusion -CBG 1 hr after cylic TPN stopped -CBG while off TPN Continue resistant SSI and insulin in TPN at 17 units Check TPN labs on Mondays/once weekly ISO stable labs  [name]6/2024 7:15 AM

## 2022-11-13 LAB — GLUCOSE, CAPILLARY
Glucose-Capillary: 109 mg/dL — ABNORMAL HIGH (ref 70–99)
Glucose-Capillary: 163 mg/dL — ABNORMAL HIGH (ref 70–99)
Glucose-Capillary: 125 mg/dL — ABNORMAL HIGH (ref 70–99)
Glucose-Capillary: 106 mg/dL — ABNORMAL HIGH (ref 70–99)

## 2022-11-13 MED ORDER — ZINC CHLORIDE 1 MG/ML IV SOLN
INTRAVENOUS | Status: AC
  Filled 2022-11-13: qty 672

## 2022-11-13 NOTE — Progress Notes (Signed)
PHARMACY - TOTAL PARENTERAL NUTRITION CONSULT NOTE   Indication: Prolonged ileus  Patient Measurements: Height: '4\' 11"'$  (149.9 cm) Weight: 98 kg (216 lb 0.8 oz) IBW/kg (Calculated) : 43.2 TPN AdjBW (KG): 55.2 Body mass index is 43.64 kg/m.  Assessment: Debra [name] is a 57 y.o. female s/p laparotomy, excision of greater omental mass, abdominal wall reconstruction with [name], appendectomy, and placement of Prevena vac.  Glucose / Insulin:  --No apparent history of diabetes --CBG checks 4x/day (0500, 1300, 1800, 2200) --BG controlled on current regimen --rSSI 4x/day (0500, 1300, 1800, 2200) --SSI last 24h: 11u  --17 units insulin in TPN (decreased from 20 units on 1/21) Electrolytes: Within normal limits Renal: SCr stable at baseline  Hepatic: Liver function overall within normal limits GI Imaging: 9/11 CTAP: no new acute issues GI Surgeries / Procedures: s/p laparotomy, excision of greater omental mass, abdominal wall reconstruction with [name], appendectomy, and placement of Prevena vac  Central access: 02/15/22 TPN start date: 02/15/22  Nutritional Goals: Goal cyclic TPN over 16 hrs: cyclic TPN over 16 hrs: (provides 102 g of protein and 1811 kcals per day) total volume over 16hrs for calculations=1400 ml (1500 ml total with overfill)  RD Assessment:  Estimated Needs Total Energy Estimated Needs: 1800-2100kcal/day Total Protein Estimated Needs: 90-110g/d Total Fluid Estimated Needs: 1.4-1.6L/day  Current Nutrition:  Soft diet + nutritional supplements, not meeting PO needs yet   Plan: Transitioned to *Cyclic* TPN on [id number].   to run over 16 hours per MD request. [name] at 2000 per discussion with dietician to allow patient time for walking in afternoon/evenings.  To run over 16 hours: -Start rate at 49 mL/hr for 1 hour. -Increase rate to 98 mL/hr for 13 hours.  -Decrease rate to 49 mL/hr for 1 hour. -Decrease rate to 25 mL/hr for 1  hour, then stop.  Plan:  See cyclic TPN rate above Continue Nutritional Components Amino acids (using 15% Clinisol): 101 g Dextrose 19% = 266 g Lipids (using 20% SMOFlipids): 50.4 g kCal: 1817/24h  Electrolytes in TPN: Na 36mq/L, K 422m/L, Ca 78m73mL, Mg 10 mEq/L, Phos 10 mmol/L  Continue ratio Cl:Ac 1:1  Continue trace elements, chromium, zinc, and selenium per dietary recs. Continue CBG/SSI for Cyclic TPN:  -CBG 2 hrs after cyclic TPN start -CBG during middle of cyclic TPN infusion -CBG 1 hr after cylic TPN stopped -CBG while off TPN Continue resistant SSI and insulin in TPN at 17 units Check TPN labs on Mondays/once weekly ISO stable labs  [name]7/2024 7:02 AM

## 2022-11-13 NOTE — Progress Notes (Addendum)
[facility] SUR[facility]ICAL PROGRESS NOTE (cpt [id number])  Hospital Day(s): 273.   Post op day(s): 271 Days Post-Op.   Interval History:  Patient seen and examined No issues overnight No new labs - Again, now on once weekly  [name] output unmeasured; managing effluent independently On TPN; On Soft diet She is ambulating better; reviewed mobility notes   Review of Systems:  Constitutional: denies fever, chills  HEENT: denies cough or congestion  Respiratory: denies any shortness of breath  Cardiovascular: denies chest pain or palpitations  Gastrointestinal: denied abdominal pain, denies N/V Genitourinary: denies burning with urination or urinary frequency Integumentary: + midline wound (stable)   Vital signs in last 24 hours: [min-max] current  Temp:  [98.1 F (36.7 C)-98.6 F (37 C)] 98.3 F (36.8 C) (03/07 0408) Pulse Rate:  [77-98] 88 (03/07 0408) Resp:  [16-20] 16 (03/07 0408) BP: (98-127)/(50-80) 98/50 (03/07 0408) SpO2:  [96 %-98 %] 98 % (03/07 0408)     Height: '4\' 11"'$  (149.9 cm) Weight: 98 kg BMI (Calculated): 43.04   Intake/Output last 2 shifts:  No intake/output data recorded.   Physical Exam:  Constitutional: alert, cooperative and no distress  Respiratory: breathing non-labored at rest  Cardiovascular: regular rate and sinus rhythm  Gastrointestinal: Soft, non-tender, non-distended, no rebound/guarding. Integumentary: Midline wound open, peritoneum closed; granulating; there are three stomatized areas visible in the LUQ and LLQ portions of the wound. No active bleeding.   Labs:     Latest Ref Rng & Units 11/08/2022    4:45 AM 10/30/2022   12:12 AM 10/23/2022   12:21 AM  CBC  WBC 4.0 - 10.5 K/uL 3.7  3.1  4.0   Hemoglobin 12.0 - 15.0 g/dL 8.1  8.1  8.2   Hematocrit 36.0 - 46.0 % 26.3  25.5  26.2   Platelets 150 - 400 K/uL 120  118  123       Latest Ref Rng & Units 11/10/2022    7:10 AM 11/06/2022    3:57 AM 11/03/2022    7:53 AM  CMP  Glucose 70 -  99 mg/dL 152  124  145   BUN 6 - 20 mg/dL '21  21  24   '$ Creatinine 0.44 - 1.00 mg/dL 0.45  0.46  0.44   Sodium 135 - 145 mmol/L 134  137  134   Potassium 3.5 - 5.1 mmol/L 4.3  3.8  4.7   Chloride 98 - 111 mmol/L 106  109  106   CO2 22 - 32 mmol/L '23  22  23   '$ Calcium 8.9 - 10.3 mg/dL 8.0  8.4  8.2   Total Protein 6.5 - 8.1 g/dL 5.7  6.7  6.1   Total Bilirubin 0.3 - 1.2 mg/dL 0.5  0.5  0.5   Alkaline Phos 38 - 126 U/L 115  159  150   AST 15 - 41 U/L 42  42  38   ALT 0 - 44 U/L 32  31  31     Imaging studies: No new pertinent imaging studies this AM   Assessment/Plan:  57 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   - New 03/07: No new issues   - Wound Care ([name] Pouch); Appreciate WOC RN assistance. No longer on intermittent or continuous suction. Patient  seems to like this better and has started to manage effluent independently. She will need intermittent suction with Yankauer to control effluent. Likely be beneficial to suction before ambulation.    - Continue soft diet as tolerated  - Continue cyclic TPN; bi-weekly nutritional labs - Appreciate dietary assistance             - Monitor abdominal examination; on-going bowel function            - Pain control prn; antemetic prn            - Progressed with therapies; no longer any recommendations; ambulating well    - Appreciate neurosurgery assistance with compression fracture; serial XRs   - Discharge Planning: Continue to anticipate lengthy admission +/- possible transfer; no new progress regarding disposition unfortunately   All of the above findings and recommendations were discussed with the patient, and the medical team, and all of patient's questions were  answered to her expressed satisfaction.  -- [name], PA-C [facility] 11/13/2022, 7:09 AM M-F: 7am - 4pm  Patient seen and examined. I agree with Mr. Freda Jackson.  No major issues, in good spirits.  Continue supportive care

## 2022-11-14 LAB — GLUCOSE, CAPILLARY
Glucose-Capillary: 88 mg/dL (ref 70–99)
Glucose-Capillary: 126 mg/dL — ABNORMAL HIGH (ref 70–99)
Glucose-Capillary: 143 mg/dL — ABNORMAL HIGH (ref 70–99)
Glucose-Capillary: 118 mg/dL — ABNORMAL HIGH (ref 70–99)

## 2022-11-14 MED ORDER — ZINC CHLORIDE 1 MG/ML IV SOLN
INTRAVENOUS | Status: AC
  Filled 2022-11-14: qty 672

## 2022-11-14 NOTE — Progress Notes (Signed)
[facility] SUR[facility] PROGRESS NOTE (cpt [id number])  Hospital Day(s): 274.   Post op day(s): 272 Days Post-Op.   Interval History:  Patient seen and examined No issues overnight Sleeping this morning No new labs - Again, now on once weekly (Mondays) No leaking this morning although has had more issues this week with this from inferior aspect  [name] output unmeasured; managing effluent independently On TPN; On Soft diet She is ambulating better; reviewed mobility notes   Review of Systems:  Constitutional: denies fever, chills  HEENT: denies cough or congestion  Respiratory: denies any shortness of breath  Cardiovascular: denies chest pain or palpitations  Gastrointestinal: denied abdominal pain, denies N/V Genitourinary: denies burning with urination or urinary frequency Integumentary: + midline wound (stable)   Vital signs in last 24 hours: [min-max] current  Temp:  [98 F (36.7 C)-98.3 F (36.8 C)] 98.1 F (36.7 C) (03/08 0437) Pulse Rate:  [74-86] 74 (03/08 0437) Resp:  [18] 18 (03/08 0437) BP: (94-100)/(52-58) 94/52 (03/08 0437) SpO2:  [97 %-100 %] 97 % (03/08 0437) Weight:  [97.6 kg] 97.6 kg (03/08 0500)     Height: '4\' 11"'$  (149.9 cm) Weight: 97.6 kg BMI (Calculated): 43.04   Intake/Output last 2 shifts:  03/07 0701 - 03/08 0700 In: 1945.3 [P.O.:320; I.V.:1625.3] Out: 1200 [Drains:700]   Physical Exam:  Constitutional: alert, cooperative and no distress  Respiratory: breathing non-labored at rest  Cardiovascular: regular rate and sinus rhythm  Gastrointestinal: Soft, non-tender, non-distended, no rebound/guarding. Integumentary: Midline wound open, peritoneum closed; granulating; there are three stomatized areas visible in the LUQ and LLQ portions of the wound. No active bleeding.   Labs:     Latest Ref Rng & Units 11/08/2022    4:45 AM 10/30/2022   12:12 AM 10/23/2022   12:21 AM  CBC  WBC 4.0 - 10.5 K/uL 3.7  3.1  4.0   Hemoglobin 12.0 - 15.0  g/dL 8.1  8.1  8.2   Hematocrit 36.0 - 46.0 % 26.3  25.5  26.2   Platelets 150 - 400 K/uL 120  118  123       Latest Ref Rng & Units 11/10/2022    7:10 AM 11/06/2022    3:57 AM 11/03/2022    7:53 AM  CMP  Glucose 70 - 99 mg/dL 152  124  145   BUN 6 - 20 mg/dL '21  21  24   '$ Creatinine 0.44 - 1.00 mg/dL 0.45  0.46  0.44   Sodium 135 - 145 mmol/L 134  137  134   Potassium 3.5 - 5.1 mmol/L 4.3  3.8  4.7   Chloride 98 - 111 mmol/L 106  109  106   CO2 22 - 32 mmol/L '23  22  23   '$ Calcium 8.9 - 10.3 mg/dL 8.0  8.4  8.2   Total Protein 6.5 - 8.1 g/dL 5.7  6.7  6.1   Total Bilirubin 0.3 - 1.2 mg/dL 0.5  0.5  0.5   Alkaline Phos 38 - 126 U/L 115  159  150   AST 15 - 41 U/L 42  42  38   ALT 0 - 44 U/L 32  31  31     Imaging studies: No new pertinent imaging studies this AM   Assessment/Plan:  57 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   - New 03/08: No new issues   - Wound Care ([name] Pouch); Appreciate WOC RN assistance. No longer on intermittent or continuous suction. Patient seems to like this better and has started to manage effluent independently. She will need intermittent suction with Yankauer to control effluent. Likely be beneficial to suction before ambulation.    - Continue soft diet as tolerated  - Continue cyclic TPN; bi-weekly nutritional labs - Appreciate dietary assistance             - Monitor abdominal examination; on-going bowel function            - Pain control prn; antemetic prn            - Progressed with therapies; no longer any recommendations; ambulating well    - Appreciate neurosurgery assistance with compression fracture; serial XRs   - Discharge Planning: Continue to anticipate lengthy  admission +/- possible transfer; no new progress regarding disposition unfortunately   All of the above findings and recommendations were discussed with the patient, and the medical team, and all of patient's questions were answered to her expressed satisfaction.  -- [name], PA-C [facility] 11/14/2022, 7:12 AM M-F: 7am - 4pm

## 2022-11-14 NOTE — Progress Notes (Signed)
PHARMACY - TOTAL PARENTERAL NUTRITION CONSULT NOTE   Indication: Prolonged ileus  Patient Measurements: Height: '4\' 11"'$  (149.9 cm) Weight: 97.6 kg (215 lb 2.7 oz) IBW/kg (Calculated) : 43.2 TPN AdjBW (KG): 55.2 Body mass index is 43.46 kg/m.  Assessment: Debra [name] is a 57 y.o. female s/p laparotomy, excision of greater omental mass, abdominal wall reconstruction with [name], appendectomy, and placement of Prevena vac.  Glucose / Insulin:  --No apparent history of diabetes --CBG checks 4x/day (0500, 1300, 1800, 2200) --BG controlled on current regimen --rSSI 4x/day (0500, 1300, 1800, 2200) --SSI last 24h: 6u  --17 units insulin in TPN (decreased from 20 units on 1/21) Electrolytes: Within normal limits Renal: SCr stable at baseline  Hepatic: Liver function overall within normal limits GI Imaging: 9/11 CTAP: no new acute issues GI Surgeries / Procedures: s/p laparotomy, excision of greater omental mass, abdominal wall reconstruction with [name], appendectomy, and placement of Prevena vac  Central access: 02/15/22 TPN start date: 02/15/22  Nutritional Goals: Goal cyclic TPN over 16 hrs: cyclic TPN over 16 hrs: (provides 102 g of protein and 1811 kcals per day) total volume over 16hrs for calculations=1400 ml (1500 ml total with overfill)  RD Assessment:  Estimated Needs Total Energy Estimated Needs: 1800-2100kcal/day Total Protein Estimated Needs: 90-110g/d Total Fluid Estimated Needs: 1.4-1.6L/day  Current Nutrition:  Soft diet + nutritional supplements, not meeting PO needs yet   Plan: Transitioned to *Cyclic* TPN on [id number].   to run over 16 hours per MD request. [name] at 2000 per discussion with dietician to allow patient time for walking in afternoon/evenings.  To run over 16 hours: -Start rate at 49 mL/hr for 1 hour. -Increase rate to 98 mL/hr for 13 hours.  -Decrease rate to 49 mL/hr for 1 hour. -Decrease rate to 25 mL/hr for  1 hour, then stop.  Plan:  See cyclic TPN rate above Continue Nutritional Components Amino acids (using 15% Clinisol): 101 g Dextrose 19% = 266 g Lipids (using 20% SMOFlipids): 50.4 g kCal: 1817/24h  Electrolytes in TPN: Na 66mq/L, K 473m/L, Ca 48m67mL, Mg 10 mEq/L, Phos 10 mmol/L  Continue ratio Cl:Ac 1:1  Continue trace elements, chromium, zinc, and selenium per dietary recs. Continue CBG/SSI for Cyclic TPN:  -CBG 2 hrs after cyclic TPN start -CBG during middle of cyclic TPN infusion -CBG 1 hr after cylic TPN stopped -CBG while off TPN Continue resistant SSI and insulin in TPN at 17 units Check TPN labs on Mondays/once weekly ISO stable labs  [name] Clinical Pharmacist 11/14/2022

## 2022-11-15 MED ORDER — ZINC CHLORIDE 1 MG/ML IV SOLN
INTRAVENOUS | Status: AC
  Filled 2022-11-15: qty 672

## 2022-11-15 NOTE — Progress Notes (Signed)
PHARMACY - TOTAL PARENTERAL NUTRITION CONSULT NOTE   Indication: Prolonged ileus  Patient Measurements: Height: '4\' 11"'$  (149.9 cm) Weight: 97.7 kg (215 lb 6.2 oz) IBW/kg (Calculated) : 43.2 TPN AdjBW (KG): 55.2 Body mass index is 43.5 kg/m.  Assessment: Debra [name] is a 57 y.o. female s/p laparotomy, excision of greater omental mass, abdominal wall reconstruction with [name], appendectomy, and placement of Prevena vac.  Glucose / Insulin:  --No apparent history of diabetes --CBG checks 4x/day (0500, 1300, 1800, 2200) --BG controlled on current regimen --rSSI 4x/day (0500, 1300, 1800, 2200) --SSI last 24h: 7u  --18 units insulin in TPN  Electrolytes: Within normal limits Renal: SCr stable at baseline  Hepatic: Liver function overall within normal limits GI Imaging: 9/11 CTAP: no new acute issues GI Surgeries / Procedures: s/p laparotomy, excision of greater omental mass, abdominal wall reconstruction with [name], appendectomy, and placement of Prevena vac  Central access: 02/15/22 TPN start date: 02/15/22  Nutritional Goals: Goal cyclic TPN over 16 hrs: cyclic TPN over 16 hrs: (provides 102 g of protein and 1811 kcals per day) total volume over 16hrs for calculations=1400 ml (1500 ml total with overfill)  RD Assessment:  Estimated Needs Total Energy Estimated Needs: 1800-2100kcal/day Total Protein Estimated Needs: 90-110g/d Total Fluid Estimated Needs: 1.4-1.6L/day  Current Nutrition:  Soft diet + nutritional supplements, not meeting PO needs yet   Plan: Transitioned to *Cyclic* TPN on [id number].   to run over 16 hours per MD request. [name] at 2000 per discussion with dietician to allow patient time for walking in afternoon/evenings.  To run over 16 hours: -Start rate at 49 mL/hr for 1 hour. -Increase rate to 98 mL/hr for 13 hours.  -Decrease rate to 49 mL/hr for 1 hour. -Decrease rate to 25 mL/hr for 1 hour, then stop.  Plan:  See  cyclic TPN rate above Continue Nutritional Components Amino acids (using 15% Clinisol): 101 g Dextrose 19% = 266 g Lipids (using 20% SMOFlipids): 50.4 g kCal: 1817/24h  Electrolytes in TPN: Na 54mq/L, K 472m/L, Ca 7m66mL, Mg 10 mEq/L, Phos 10 mmol/L  Continue ratio Cl:Ac 1:1  Continue trace elements, chromium, zinc, and selenium per dietary recs. Continue CBG/SSI for Cyclic TPN:  -CBG 2 hrs after cyclic TPN start -CBG during middle of cyclic TPN infusion -CBG 1 hr after cylic TPN stopped -CBG while off TPN Continue resistant SSI and slightly adjust insulin in TPN from 17 to 18 units Check TPN labs on Mondays/once weekly ISO stable labs  [name] Clinical Pharmacist 11/15/2022

## 2022-11-15 NOTE — Progress Notes (Signed)
[facility] SUR[facility]ICAL PROGRESS NOTE (cpt [phone])  Hospital Day(s): 275.   Post op day(s): 273 Days Post-Op.   Interval History:  Patient seen and examined No issues overnight Patient reports that her pain has been improving.  No issues with the pouch overnight and has not had leakage recently.  She does feel like the wound is getting smaller On TPN; On Soft diet She is ambulating better; reviewed mobility notes   Review of Systems:  Constitutional: denies fever, chills  HEENT: denies cough or congestion  Respiratory: denies any shortness of breath  Cardiovascular: denies chest pain or palpitations  Gastrointestinal: denied abdominal pain, denies N/V Genitourinary: denies burning with urination or urinary frequency Integumentary: + midline wound (stable)   Vital signs in last 24 hours: [min-max] current  Temp:  [97.8 F (36.6 C)-98.7 F (37.1 C)] 98.5 F (36.9 C) (03/09 0746) Pulse Rate:  [73-100] 78 (03/09 0746) Resp:  [16-20] 18 (03/09 0746) BP: (103-120)/(56-80) 106/62 (03/09 0746) SpO2:  [94 %-100 %] 97 % (03/09 0746) Weight:  [97.7 kg] 97.7 kg (03/09 0421)     Height: '4\' 11"'$  (149.9 cm) Weight: 97.7 kg BMI (Calculated): 43.04   Intake/Output last 2 shifts:  03/08 0701 - 03/09 0700 In: 647.6 [I.V.:647.6] Out: 575 [Drains:575]   Physical Exam:  Constitutional: alert, cooperative and no distress  Respiratory: breathing non-labored at rest  Cardiovascular: regular rate and sinus rhythm  Gastrointestinal: Soft, non-tender, non-distended, no rebound/guarding. Integumentary: Midline wound open, peritoneum closed; granulating; there are three stomatized areas visible in the LUQ and LLQ portions of the wound. No active bleeding.   Labs:     Latest Ref Rng & Units 11/08/2022    4:45 AM 10/30/2022   12:12 AM 10/23/2022   12:21 AM  CBC  WBC 4.0 - 10.5 K/uL 3.7  3.1  4.0   Hemoglobin 12.0 - 15.0 g/dL 8.1  8.1  8.2   Hematocrit 36.0 - 46.0 % 26.3  25.5  26.2    Platelets 150 - 400 K/uL 120  118  123       Latest Ref Rng & Units 11/10/2022    7:10 AM 11/06/2022    3:57 AM 11/03/2022    7:53 AM  CMP  Glucose 70 - 99 mg/dL 152  124  145   BUN 6 - 20 mg/dL '21  21  24   '$ Creatinine 0.44 - 1.00 mg/dL 0.45  0.46  0.44   Sodium 135 - 145 mmol/L 134  137  134   Potassium 3.5 - 5.1 mmol/L 4.3  3.8  4.7   Chloride 98 - 111 mmol/L 106  109  106   CO2 22 - 32 mmol/L '23  22  23   '$ Calcium 8.9 - 10.3 mg/dL 8.0  8.4  8.2   Total Protein 6.5 - 8.1 g/dL 5.7  6.7  6.1   Total Bilirubin 0.3 - 1.2 mg/dL 0.5  0.5  0.5   Alkaline Phos 38 - 126 U/L 115  159  150   AST 15 - 41 U/L 42  42  38   ALT 0 - 44 U/L 32  31  31     Imaging studies: No new pertinent imaging studies this AM   Assessment/Plan:  57 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   - New 03/09: No new issues   - Wound Care ([name] Pouch); Appreciate WOC RN assistance. No longer on intermittent or continuous suction. Patient seems to like this better and has started to manage effluent independently. She will need intermittent suction with Yankauer to control effluent. Likely be beneficial to suction before ambulation.    - Continue soft diet as tolerated  - Continue cyclic TPN; bi-weekly nutritional labs - Appreciate dietary assistance             - Monitor abdominal examination; on-going bowel function            - Pain control prn; antemetic prn            - Progressed with therapies; no longer any recommendations; ambulating well    - Appreciate neurosurgery assistance with compression fracture; serial XRs   - Discharge Planning: Continue to anticipate lengthy admission +/- possible transfer; no new progress regarding disposition  unfortunately   I spent 35 minutes dedicated to the care of this patient on the date of this encounter to include pre-visit review of records, face-to-face time with the patient discussing diagnosis and management, and any post-visit coordination of care.   Olean Ree, MD [facility]

## 2022-11-16 LAB — GLUCOSE, CAPILLARY: Glucose-Capillary: 93 mg/dL (ref 70–99)

## 2022-11-16 MED ORDER — ZINC CHLORIDE 1 MG/ML IV SOLN
INTRAVENOUS | Status: AC
  Filled 2022-11-16: qty 672

## 2022-11-16 NOTE — Progress Notes (Signed)
PHARMACY - TOTAL PARENTERAL NUTRITION CONSULT NOTE   Indication: Prolonged ileus  Patient Measurements: Height: '4\' 11"'$  (149.9 cm) Weight: 99 kg (218 lb 4.1 oz) IBW/kg (Calculated) : 43.2 TPN AdjBW (KG): 55.2 Body mass index is 44.08 kg/m.  Assessment: Debra [name] is a 57 y.o. female s/p laparotomy, excision of greater omental mass, abdominal wall reconstruction with [name], appendectomy, and placement of Prevena vac.  Glucose / Insulin:  --No apparent history of diabetes --CBG checks 4x/day (0500, 1300, 1800, 2200) --BG controlled on current regimen --rSSI 4x/day (0500, 1300, 1800, 2200) --SSI last 24h: 7u  --18 units insulin in TPN  Electrolytes: Within normal limits Renal: SCr stable at baseline  Hepatic: Liver function overall within normal limits GI Imaging: 9/11 CTAP: no new acute issues GI Surgeries / Procedures: s/p laparotomy, excision of greater omental mass, abdominal wall reconstruction with [name], appendectomy, and placement of Prevena vac  Central access: 02/15/22 TPN start date: 02/15/22  Nutritional Goals: Goal cyclic TPN over 16 hrs: cyclic TPN over 16 hrs: (provides 102 g of protein and 1811 kcals per day) total volume over 16hrs for calculations=1400 ml (1500 ml total with overfill)  RD Assessment:  Estimated Needs Total Energy Estimated Needs: 1800-2100kcal/day Total Protein Estimated Needs: 90-110g/d Total Fluid Estimated Needs: 1.4-1.6L/day  Current Nutrition:  Soft diet + nutritional supplements, not meeting PO needs yet   Plan: Transitioned to *Cyclic* TPN on [id number].   to run over 16 hours per MD request. [name] at 2000 per discussion with dietician to allow patient time for walking in afternoon/evenings.  To run over 16 hours: -Start rate at 49 mL/hr for 1 hour. -Increase rate to 98 mL/hr for 13 hours.  -Decrease rate to 49 mL/hr for 1 hour. -Decrease rate to 25 mL/hr for 1 hour, then stop.  Plan:  See  cyclic TPN rate above Continue Nutritional Components Amino acids (using 15% Clinisol): 101 g Dextrose 19% = 266 g Lipids (using 20% SMOFlipids): 50.4 g kCal: 1817/24h  Electrolytes in TPN: Na 38mq/L, K 432m/L, Ca 18m32mL, Mg 10 mEq/L, Phos 10 mmol/L  Continue ratio Cl:Ac 1:1  Continue trace elements, chromium, zinc, and selenium per dietary recs. Continue CBG/SSI for Cyclic TPN:  -CBG 2 hrs after cyclic TPN start -CBG during middle of cyclic TPN infusion -CBG 1 hr after cylic TPN stopped -CBG while off TPN Continue resistant SSI and slightly adjust insulin in TPN from 17 to 18 units on 11/15/22 Check TPN labs on Mondays/once weekly ISO stable labs  [name] Clinical Pharmacist 11/16/2022

## 2022-11-16 NOTE — Progress Notes (Signed)
[facility] SUR[facility]ICAL PROGRESS NOTE (cpt [phone])  Hospital Day(s): 276.   Post op day(s): 274 Days Post-Op.   Interval History:  Patient seen and examined No issues overnight Patient reports that her pain has been improving.  No issues with the [name] pouch overnight and has not had leakage.  The last replacement was 3/8.  She does feel like the wound is getting smaller On TPN; On Soft diet She is ambulating better; reviewed mobility notes   Review of Systems:  Constitutional: denies fever, chills  HEENT: denies cough or congestion  Respiratory: denies any shortness of breath  Cardiovascular: denies chest pain or palpitations  Gastrointestinal: denied abdominal pain, denies N/V Genitourinary: denies burning with urination or urinary frequency Integumentary: + midline wound (stable)   Vital signs in last 24 hours: [min-max] current  Temp:  [98.1 F (36.7 C)-98.6 F (37 C)] 98.6 F (37 C) (03/10 0801) Pulse Rate:  [79-87] 81 (03/10 0801) Resp:  [16-20] 20 (03/10 0801) BP: (100-111)/(53-65) 100/53 (03/10 0801) SpO2:  [99 %] 99 % (03/10 0801) Weight:  [99 kg] 99 kg (03/10 0504)     Height: '4\' 11"'$  (149.9 cm) Weight: 99 kg BMI (Calculated): 43.04   Intake/Output last 2 shifts:  03/09 0701 - 03/10 0700 In: 522.6 [I.V.:522.6] Out: 400 [Drains:100; Stool:300]   Physical Exam:  Constitutional: alert, cooperative and no distress  Respiratory: breathing non-labored at rest  Cardiovascular: regular rate and sinus rhythm  Gastrointestinal: Soft, non-tender, non-distended, no rebound/guarding. Integumentary: Midline wound open, peritoneum closed; granulating; there are three stomatized areas visible in the LUQ and LLQ portions of the wound. No active bleeding.   Labs:     Latest Ref Rng & Units 11/08/2022    4:45 AM 10/30/2022   12:12 AM 10/23/2022   12:21 AM  CBC  WBC 4.0 - 10.5 K/uL 3.7  3.1  4.0   Hemoglobin 12.0 - 15.0 g/dL 8.1  8.1  8.2   Hematocrit 36.0 -  46.0 % 26.3  25.5  26.2   Platelets 150 - 400 K/uL 120  118  123       Latest Ref Rng & Units 11/10/2022    7:10 AM 11/06/2022    3:57 AM 11/03/2022    7:53 AM  CMP  Glucose 70 - 99 mg/dL 152  124  145   BUN 6 - 20 mg/dL '21  21  24   '$ Creatinine 0.44 - 1.00 mg/dL 0.45  0.46  0.44   Sodium 135 - 145 mmol/L 134  137  134   Potassium 3.5 - 5.1 mmol/L 4.3  3.8  4.7   Chloride 98 - 111 mmol/L 106  109  106   CO2 22 - 32 mmol/L '23  22  23   '$ Calcium 8.9 - 10.3 mg/dL 8.0  8.4  8.2   Total Protein 6.5 - 8.1 g/dL 5.7  6.7  6.1   Total Bilirubin 0.3 - 1.2 mg/dL 0.5  0.5  0.5   Alkaline Phos 38 - 126 U/L 115  159  150   AST 15 - 41 U/L 42  42  38   ALT 0 - 44 U/L 32  31  31     Imaging studies: No new pertinent imaging studies this AM   Assessment/Plan:  57 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   - New 03/10: No new issues   - Wound Care ([name] Pouch); Appreciate WOC RN assistance. No longer on intermittent or continuous suction. Patient seems to like this better and has started to manage effluent independently. She will need intermittent suction with Yankauer to control effluent. Likely be beneficial to suction before ambulation.    - Continue soft diet as tolerated  - Continue cyclic TPN; bi-weekly nutritional labs - Appreciate dietary assistance             - Monitor abdominal examination; on-going bowel function            - Pain control prn; antemetic prn            - Progressed with therapies; no longer any recommendations; ambulating well    - Appreciate neurosurgery assistance with compression fracture; serial XRs   - Discharge Planning: Continue to anticipate lengthy admission +/- possible transfer; no new  progress regarding disposition unfortunately   I spent 35 minutes dedicated to the care of this patient on the date of this encounter to include pre-visit review of records, face-to-face time with the patient discussing diagnosis and management, and any post-visit coordination of care.   Olean Ree, MD [facility]

## 2022-11-17 LAB — COMPREHENSIVE METABOLIC PANEL
ALT: 41 U/L (ref 0–44)
AST: 46 U/L — ABNORMAL HIGH (ref 15–41)
Albumin: 3 g/dL — ABNORMAL LOW (ref 3.5–5.0)
Alkaline Phosphatase: 151 U/L — ABNORMAL HIGH (ref 38–126)
Anion gap: 4 — ABNORMAL LOW (ref 5–15)
BUN: 27 mg/dL — ABNORMAL HIGH (ref 6–20)
CO2: 22 mmol/L (ref 22–32)
Calcium: 8.7 mg/dL — ABNORMAL LOW (ref 8.9–10.3)
Chloride: 104 mmol/L (ref 98–111)
Creatinine, Ser: 0.55 mg/dL (ref 0.44–1.00)
GFR, Estimated: >60 mL/min (ref >60)
Glucose, Bld: 117 mg/dL — ABNORMAL HIGH (ref 70–99)
Potassium: 4.3 mmol/L (ref 3.5–5.1)
Sodium: 130 mmol/L — ABNORMAL LOW (ref 135–145)
Total Bilirubin: 0.7 mg/dL (ref 0.3–1.2)
Total Protein: 7.2 g/dL (ref 6.5–8.1)

## 2022-11-17 LAB — GLUCOSE, CAPILLARY
Glucose-Capillary: 103 mg/dL — ABNORMAL HIGH (ref 70–99)
Glucose-Capillary: 170 mg/dL — ABNORMAL HIGH (ref 70–99)
Glucose-Capillary: 115 mg/dL — ABNORMAL HIGH (ref 70–99)
Glucose-Capillary: 94 mg/dL (ref 70–99)
Glucose-Capillary: 130 mg/dL — ABNORMAL HIGH (ref 70–99)
Glucose-Capillary: 159 mg/dL — ABNORMAL HIGH (ref 70–99)
Glucose-Capillary: 116 mg/dL — ABNORMAL HIGH (ref 70–99)
Glucose-Capillary: 120 mg/dL — ABNORMAL HIGH (ref 70–99)
Glucose-Capillary: 157 mg/dL — ABNORMAL HIGH (ref 70–99)
Glucose-Capillary: 105 mg/dL — ABNORMAL HIGH (ref 70–99)

## 2022-11-17 LAB — CBC
HCT: 28.7 % — ABNORMAL LOW (ref 36.0–46.0)
Hemoglobin: 9.2 g/dL — ABNORMAL LOW (ref 12.0–15.0)
MCH: 29.4 pg (ref 26.0–34.0)
MCHC: 32.1 g/dL (ref 30.0–36.0)
MCV: 91.7 fL (ref 80.0–100.0)
Platelets: 144 10*3/uL — ABNORMAL LOW (ref 150–400)
RBC: 3.13 MIL/uL — ABNORMAL LOW (ref 3.87–5.11)
RDW: 17.8 % — ABNORMAL HIGH (ref 11.5–15.5)
WBC: 4.2 10*3/uL (ref 4.0–10.5)
nRBC: 0 % (ref 0.0–0.2)

## 2022-11-17 LAB — PHOSPHORUS: Phosphorus: 3.8 mg/dL (ref 2.5–4.6)

## 2022-11-17 LAB — MAGNESIUM: Magnesium: 2.1 mg/dL (ref 1.7–2.4)

## 2022-11-17 MED ORDER — ZINC CHLORIDE 1 MG/ML IV SOLN
INTRAVENOUS | Status: AC
  Filled 2022-11-17: qty 672

## 2022-11-17 NOTE — Progress Notes (Signed)
[facility] (cpt [phone])  Hospital Day(s): 277.   Post op day(s): 275 Days Post-Op.   Interval History:  Patient seen and examined No issues overnight Did well over the weekend Weekly nutritional labs pending this AM [name] output unmeasured; managing effluent independently On TPN; On Soft diet She is ambulating better; reviewed mobility notes   Review of Systems:  Constitutional: denies fever, chills  HEENT: denies cough or congestion  Respiratory: denies any shortness of breath  Cardiovascular: denies chest pain or palpitations  Gastrointestinal: denied abdominal pain, denies N/V Genitourinary: denies burning with urination or urinary frequency Integumentary: + midline wound (stable)   Vital signs in last 24 hours: [min-max] current  Temp:  [98 F (36.7 C)-98.6 F (37 C)] 98.5 F (36.9 C) (03/11 0417) Pulse Rate:  [74-89] 81 (03/11 0417) Resp:  [16-20] 16 (03/11 0417) BP: (97-121)/(52-60) 97/52 (03/11 0417) SpO2:  [97 %-100 %] 97 % (03/11 0417)     Height: '4\' 11"'$  (149.9 cm) Weight: 99 kg BMI (Calculated): 43.04   Intake/Output last 2 shifts:  No intake/output data recorded.   Physical Exam:  Constitutional: alert, cooperative and no distress  Respiratory: breathing non-labored at rest  Cardiovascular: regular rate and sinus rhythm  Gastrointestinal: Soft, non-tender, non-distended, no rebound/guarding. Integumentary: Midline wound open, peritoneum closed; granulating; there are three stomatized areas visible in the LUQ and LLQ portions of the wound. No active bleeding.   Labs:     Latest Ref Rng & Units 11/08/2022    4:45 AM 10/30/2022   12:12 AM 10/23/2022   12:21 AM  CBC  WBC 4.0 - 10.5 K/uL 3.7  3.1  4.0   Hemoglobin 12.0 - 15.0 g/dL 8.1  8.1  8.2   Hematocrit 36.0 - 46.0 % 26.3  25.5  26.2   Platelets 150 - 400 K/uL 120  118  123       Latest Ref Rng & Units 11/10/2022    7:10 AM 11/06/2022    3:57 AM 11/03/2022     7:53 AM  CMP  Glucose 70 - 99 mg/dL 152  124  145   BUN 6 - 20 mg/dL '21  21  24   '$ Creatinine 0.44 - 1.00 mg/dL 0.45  0.46  0.44   Sodium 135 - 145 mmol/L 134  137  134   Potassium 3.5 - 5.1 mmol/L 4.3  3.8  4.7   Chloride 98 - 111 mmol/L 106  109  106   CO2 22 - 32 mmol/L '23  22  23   '$ Calcium 8.9 - 10.3 mg/dL 8.0  8.4  8.2   Total Protein 6.5 - 8.1 g/dL 5.7  6.7  6.1   Total Bilirubin 0.3 - 1.2 mg/dL 0.5  0.5  0.5   Alkaline Phos 38 - 126 U/L 115  159  150   AST 15 - 41 U/L 42  42  38   ALT 0 - 44 U/L 32  31  31     Imaging studies: No new pertinent imaging studies this AM   Assessment/Plan:  57 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  - New 03/11: No new issues; Weekly nutritional labs are pending this AM  - Wound Care ([name] Pouch); Appreciate WOC RN assistance. No longer  on intermittent or continuous suction. Patient seems to like this better and has started to manage effluent independently. She will need intermittent suction with Yankauer to control effluent. Likely be beneficial to suction before ambulation.    - Continue soft diet as tolerated  - Continue cyclic TPN; weekly nutritional labs - Appreciate dietary assistance             - Monitor abdominal examination; on-going bowel function            - Pain control prn; antemetic prn            - Progressed with therapies; no longer any recommendations; ambulating well    - Appreciate neurosurgery assistance with compression fracture; serial XRs   - Discharge Planning: Continue to anticipate lengthy admission +/- possible transfer; no new progress regarding disposition unfortunately   All of the above findings and recommendations were discussed with the  patient, and the medical team, and all of patient's questions were answered to her expressed satisfaction.  -- [name], PA-C [facility] 11/17/2022, 7:26 AM M-F: 7am - 4pm

## 2022-11-17 NOTE — TOC Progression Note (Signed)
Transition of Care [facility]) - Progression Note    Patient Details  Name: Debra [name] MRN: [id number] Date of Birth: 28-Jun-1966  Transition of Care [facility]) CM/SW Contact  Ross Ludwig, [place] Phone Number: 11/17/2022, 5:43 PM  Clinical Narrative:     TOC continuing to follow patient's progress throughout discharge planning.  Patient still on TPN.   Expected Discharge Plan: [place] Barriers to Discharge: Continued Medical Work up  Expected Discharge Plan and Services                                               Social Determinants of Health (SDOH) Interventions SDOH Screenings   Food Insecurity: No Food Insecurity (05/20/2022)  Housing: Low Risk  (05/20/2022)  Transportation Needs: No Transportation Needs (05/20/2022)  Utilities: Not At Risk (05/20/2022)  Tobacco Use: Low Risk  (09/12/2022)    Readmission Risk Interventions     No data to display

## 2022-11-17 NOTE — Progress Notes (Signed)
0500 accucheck obtained per order, however results did not port over from glucometer. BG 157. SS dose administered and tolerated well by pt. Plan of care ongoing.

## 2022-11-17 NOTE — Progress Notes (Signed)
PHARMACY - TOTAL PARENTERAL NUTRITION CONSULT NOTE   Indication: Prolonged ileus  Patient Measurements: Height: '4\' 11"'$  (149.9 cm) Weight: 99 kg (218 lb 4.1 oz) IBW/kg (Calculated) : 43.2 TPN AdjBW (KG): 55.2 Body mass index is 44.08 kg/m.  Assessment: Debra [name] is a 57 y.o. female s/p laparotomy, excision of greater omental mass, abdominal wall reconstruction with [name], appendectomy, and placement of Prevena vac.  Glucose / Insulin:  --No apparent history of diabetes --CBG checks 4x/day (0500, 1300, 1800, 2200) --BG controlled on current regimen --rSSI 4x/day (0500, 1300, 1800, 2200) --SSI last 24h: 4u  --18 units insulin in TPN  Electrolytes: Within normal limits Renal: SCr stable at baseline  Hepatic: Liver function overall within normal limits GI Imaging: 9/11 CTAP: no new acute issues GI Surgeries / Procedures: s/p laparotomy, excision of greater omental mass, abdominal wall reconstruction with [name], appendectomy, and placement of Prevena vac  Central access: 02/15/22 TPN start date: 02/15/22  Nutritional Goals: Goal cyclic TPN over 16 hrs: cyclic TPN over 16 hrs: (provides 102 g of protein and 1811 kcals per day) total volume over 16hrs for calculations=1400 ml (1500 ml total with overfill)  RD Assessment:  Estimated Needs Total Energy Estimated Needs: 1800-2100kcal/day Total Protein Estimated Needs: 90-110g/d Total Fluid Estimated Needs: 1.4-1.6L/day  Current Nutrition:  Soft diet + nutritional supplements, not meeting PO needs yet   Plan: Transitioned to *Cyclic* TPN on [id number].   to run over 16 hours per MD request. [name] at 2000 per discussion with dietician to allow patient time for walking in afternoon/evenings.  To run over 16 hours: -Start rate at 49 mL/hr for 1 hour. -Increase rate to 98 mL/hr for 13 hours.  -Decrease rate to 49 mL/hr for 1 hour. -Decrease rate to 25 mL/hr for 1 hour, then stop.  Plan:  See  cyclic TPN rate above Continue Nutritional Components Amino acids (using 15% Clinisol): 101 g Dextrose 19% = 266 g Lipids (using 20% SMOFlipids): 50.4 g kCal: 1817/24h  Electrolytes in TPN: Na 71mq/L, K 430m/L, Ca 68m50mL, Mg 10 mEq/L, Phos 10 mmol/L  Continue ratio Cl:Ac 1:1  Continue trace elements, chromium, zinc, and selenium per dietary recs. Continue CBG/SSI for Cyclic TPN:  -CBG 2 hrs after cyclic TPN start -CBG during middle of cyclic TPN infusion -CBG 1 hr after cylic TPN stopped -CBG while off TPN Continue resistant SSI and slightly adjust insulin in TPN from 17 to 18 units on 11/15/22 Check TPN labs on Mondays/once weekly ISO stable labs  RodVallery SaharmD, BCPS Clinical Pharmacist 11/17/2022

## 2022-11-18 LAB — COMPREHENSIVE METABOLIC PANEL
ALT: 33 U/L (ref 0–44)
AST: 37 U/L (ref 15–41)
Albumin: 2.6 g/dL — ABNORMAL LOW (ref 3.5–5.0)
Alkaline Phosphatase: 136 U/L — ABNORMAL HIGH (ref 38–126)
Anion gap: 4 — ABNORMAL LOW (ref 5–15)
BUN: 21 mg/dL — ABNORMAL HIGH (ref 6–20)
CO2: 24 mmol/L (ref 22–32)
Calcium: 8.3 mg/dL — ABNORMAL LOW (ref 8.9–10.3)
Chloride: 104 mmol/L (ref 98–111)
Creatinine, Ser: 0.42 mg/dL — ABNORMAL LOW (ref 0.44–1.00)
GFR, Estimated: >60 mL/min (ref >60)
Glucose, Bld: 139 mg/dL — ABNORMAL HIGH (ref 70–99)
Potassium: 4 mmol/L (ref 3.5–5.1)
Sodium: 132 mmol/L — ABNORMAL LOW (ref 135–145)
Total Bilirubin: 0.4 mg/dL (ref 0.3–1.2)
Total Protein: 6.1 g/dL — ABNORMAL LOW (ref 6.5–8.1)

## 2022-11-18 LAB — CBC
HCT: 25.1 % — ABNORMAL LOW (ref 36.0–46.0)
Hemoglobin: 7.9 g/dL — ABNORMAL LOW (ref 12.0–15.0)
MCH: 29.3 pg (ref 26.0–34.0)
MCHC: 31.5 g/dL (ref 30.0–36.0)
MCV: 93 fL (ref 80.0–100.0)
Platelets: 104 10*3/uL — ABNORMAL LOW (ref 150–400)
RBC: 2.7 MIL/uL — ABNORMAL LOW (ref 3.87–5.11)
RDW: 17.3 % — ABNORMAL HIGH (ref 11.5–15.5)
WBC: 3.2 10*3/uL — ABNORMAL LOW (ref 4.0–10.5)
nRBC: 0 % (ref 0.0–0.2)

## 2022-11-18 LAB — PHOSPHORUS: Phosphorus: 3.3 mg/dL (ref 2.5–4.6)

## 2022-11-18 LAB — GLUCOSE, CAPILLARY
Glucose-Capillary: 106 mg/dL — ABNORMAL HIGH (ref 70–99)
Glucose-Capillary: 122 mg/dL — ABNORMAL HIGH (ref 70–99)
Glucose-Capillary: 151 mg/dL — ABNORMAL HIGH (ref 70–99)
Glucose-Capillary: 127 mg/dL — ABNORMAL HIGH (ref 70–99)
Glucose-Capillary: 75 mg/dL (ref 70–99)

## 2022-11-18 LAB — MAGNESIUM: Magnesium: 2 mg/dL (ref 1.7–2.4)

## 2022-11-18 MED ORDER — DEXTROSE 10 % IV SOLN: INTRAVENOUS | Status: AC

## 2022-11-18 MED ORDER — ZINC CHLORIDE 1 MG/ML IV SOLN
INTRAVENOUS | Status: AC
  Filled 2022-11-18: qty 672

## 2022-11-18 NOTE — Plan of Care (Addendum)
Patient AOX4, VSS throughout shift.  All meds given on time as ordered.  Pt standby assist to bathroom.  [name] pouch drained multiple times throughout shift.  CHG wipes applied to full body.  Diminished lungs, IS encouraged.  POC maintained, will continue to monitor.  Problem: Clinical Measurements: Goal: Will remain free from infection Outcome: Progressing   Problem: Nutrition: Goal: Adequate nutrition will be maintained Outcome: Progressing   Problem: Activity: Goal: Risk for activity intolerance will decrease Outcome: Progressing   Problem: Nutrition: Goal: Adequate nutrition will be maintained Outcome: Progressing   Problem: Coping: Goal: Level of anxiety will decrease Outcome: Progressing   Problem: Pain Managment: Goal: General experience of comfort will improve Outcome: Progressing

## 2022-11-18 NOTE — Progress Notes (Signed)
PHARMACY - TOTAL PARENTERAL NUTRITION CONSULT NOTE   Indication: Prolonged ileus  Patient Measurements: Height: '4\' 11"'$  (149.9 cm) Weight: 99 kg (218 lb 4.1 oz) IBW/kg (Calculated) : 43.2 TPN AdjBW (KG): 55.2 Body mass index is 44.08 kg/m.  Assessment: Debra [name] is a 57 y.o. female s/p laparotomy, excision of greater omental mass, abdominal wall reconstruction with [name], appendectomy, and placement of Prevena vac.  Glucose / Insulin:  --No apparent history of diabetes --CBG checks 4x/day (0500, 1300, 1800, 2200) --BG controlled on current regimen --rSSI 4x/day (0500, 1300, 1800, 2200) --SSI last 24h: 3u  --18 units insulin in TPN  Electrolytes: Within normal limits Renal: SCr stable at baseline  Hepatic: Liver function overall within normal limits GI Imaging: 9/11 CTAP: no new acute issues GI Surgeries / Procedures: s/p laparotomy, excision of greater omental mass, abdominal wall reconstruction with [name], appendectomy, and placement of Prevena vac  Central access: 02/15/22 TPN start date: 02/15/22  Nutritional Goals: Goal cyclic TPN over 16 hrs: cyclic TPN over 16 hrs: (provides 102 g of protein and 1811 kcals per day) total volume over 16hrs for calculations=1400 ml (1500 ml total with overfill)  RD Assessment:  Estimated Needs Total Energy Estimated Needs: 1800-2100kcal/day Total Protein Estimated Needs: 90-110g/d Total Fluid Estimated Needs: 1.4-1.6L/day  Current Nutrition:  Soft diet + nutritional supplements, not meeting PO needs yet   Plan: Transitioned to *Cyclic* TPN on [id number].   to run over 16 hours per MD request. [name] at 2000 per discussion with dietician to allow patient time for walking in afternoon/evenings.  To run over 16 hours: -Start rate at 49 mL/hr for 1 hour. -Increase rate to 98 mL/hr for 13 hours.  -Decrease rate to 49 mL/hr for 1 hour. -Decrease rate to 25 mL/hr for 1 hour, then stop.  Plan:  See  cyclic TPN rate above Continue Nutritional Components Amino acids (using 15% Clinisol): 101 g Dextrose 19% = 266 g Lipids (using 20% SMOFlipids): 50.4 g kCal: 1817/24h  Electrolytes in TPN: Na 42mq/L, K 42105m/L, Ca 105m47mL, Mg 10 mEq/L, Phos 10 mmol/L  Continue ratio Cl:Ac 1:1  Continue trace elements, chromium, zinc, and selenium per dietary recs. Continue CBG/SSI for Cyclic TPN:  -CBG 2 hrs after cyclic TPN start -CBG during middle of cyclic TPN infusion -CBG 1 hr after cylic TPN stopped -CBG while off TPN Continue resistant SSI and slightly adjust insulin in TPN from 17 to 18 units on 11/15/22 Check TPN labs on Mondays/once weekly ISO stable labs  HowTollie EthI, PharmD Clinical Pharmacist 11/18/2022

## 2022-11-18 NOTE — Progress Notes (Signed)
[name] pouch changed during this shift d/t leaking.

## 2022-11-18 NOTE — Progress Notes (Signed)
[facility] SURGICAL PROGRESS NOTE (cpt [phone])  Hospital Day(s): 278.   Post op day(s): 276 Days Post-Op.   Interval History:  Patient seen and examined No issues overnight Doing well; no acute changes  Labs this morning reassuring; Hgb to 7.9 - no bleeding Renal function normal; electrolytes reassuring aside from mild hyponatremia (132) [name] output 300 ccs + unmeasured; managing effluent independently On TPN; On Soft diet She is ambulating better; reviewed mobility notes   Review of Systems:  Constitutional: denies fever, chills  HEENT: denies cough or congestion  Respiratory: denies any shortness of breath  Cardiovascular: denies chest pain or palpitations  Gastrointestinal: denied abdominal pain, denies N/V Genitourinary: denies burning with urination or urinary frequency Integumentary: + midline wound (stable)   Vital signs in last 24 hours: [min-max] current  Temp:  [98.3 F (36.8 C)-98.4 F (36.9 C)] 98.3 F (36.8 C) (03/12 0810) Pulse Rate:  [72-80] 72 (03/12 0810) Resp:  [16-18] 16 (03/12 0810) BP: (98-111)/(46-74) 106/46 (03/12 0810) SpO2:  [98 %-100 %] 98 % (03/12 0810)     Height: '4\' 11"'$  (149.9 cm) Weight: 99 kg BMI (Calculated): 43.04   Intake/Output last 2 shifts:  03/11 0701 - 03/12 0700 In: 480 [P.O.:480] Out: 300 [Stool:300]   Physical Exam:  Constitutional: alert, cooperative and no distress  Respiratory: breathing non-labored at rest  Cardiovascular: regular rate and sinus rhythm  Gastrointestinal: Soft, non-tender, non-distended, no rebound/guarding. Integumentary: Midline wound open, peritoneum closed; granulating; there are three stomatized areas visible in the LUQ and LLQ portions of the wound. No active bleeding.   Labs:     Latest Ref Rng & Units 11/18/2022    4:50 AM 11/17/2022    5:30 PM 11/08/2022    4:45 AM  CBC  WBC 4.0 - 10.5 K/uL 3.2  4.2  3.7   Hemoglobin 12.0 - 15.0 g/dL 7.9  9.2  8.1   Hematocrit 36.0 - 46.0 %  25.1  28.7  26.3   Platelets 150 - 400 K/uL 104  144  120       Latest Ref Rng & Units 11/18/2022    4:50 AM 11/17/2022    5:30 PM 11/10/2022    7:10 AM  CMP  Glucose 70 - 99 mg/dL 139  117  152   BUN 6 - 20 mg/dL '21  27  21   '$ Creatinine 0.44 - 1.00 mg/dL 0.42  0.55  0.45   Sodium 135 - 145 mmol/L 132  130  134   Potassium 3.5 - 5.1 mmol/L 4.0  4.3  4.3   Chloride 98 - 111 mmol/L 104  104  106   CO2 22 - 32 mmol/L '24  22  23   '$ Calcium 8.9 - 10.3 mg/dL 8.3  8.7  8.0   Total Protein 6.5 - 8.1 g/dL 6.1  7.2  5.7   Total Bilirubin 0.3 - 1.2 mg/dL 0.4  0.7  0.5   Alkaline Phos 38 - 126 U/L 136  151  115   AST 15 - 41 U/L 37  46  42   ALT 0 - 44 U/L 33  41  32     Imaging studies: No new pertinent imaging studies this AM   Assessment/Plan:  57 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  - New 03/12: No new issues; labs reviewed. Monitor H&H - no bleeding   - Wound Care ([name] Pouch); Appreciate WOC RN assistance. No longer on intermittent or continuous suction. Patient seems to like this better and has started to manage effluent independently. She will need intermittent suction with Yankauer to control effluent. Likely be beneficial to suction before ambulation.    - Continue soft diet as tolerated  - Continue cyclic TPN; weekly nutritional labs - Appreciate dietary assistance             - Monitor abdominal examination; on-going bowel function            - Pain control prn; antemetic prn            - Progressed with therapies; no longer any recommendations; ambulating well    - Appreciate neurosurgery assistance with compression fracture; serial XRs   - Discharge Planning: Continue to anticipate lengthy admission +/-  possible transfer; no new progress regarding disposition unfortunately   All of the above findings and recommendations were discussed with the patient, and the medical team, and all of patient's questions were answered to her expressed satisfaction.  -- Debra Simon, PA-C [facility] 11/18/2022, 12:34 PM M-F: 7am - 4pm

## 2022-11-18 NOTE — Progress Notes (Signed)
Nutrition Follow-up  DOCUMENTATION CODES:   Obesity unspecified  INTERVENTION:   Continue cyclic TPN per pharmacy (16 hrs)- provides 1811kcal/day and 102g/day protein   Ensure Enlive po BID, each supplement provides 350 kcal and 20 grams of protein.  MVI, chromium and trace elements in TPN   Triglycerides checked monthly   Daily weights   Vitamin D2- 50,000 units po weekly   Zinc additional '5mg'$  daily added to TPN   Selenium additional 66mg daily added to TPN   Check zinc and selenium labs 3/14  Routine vitamin labs due 4/9- will include yearly carnitine and aluminum.   NUTRITION DIAGNOSIS:   Increased nutrient needs related to wound healing, catabolic illness as evidenced by estimated needs. -ongoing   GOAL:   Patient will meet greater than or equal to 90% of their needs -met with TPN   MONITOR:   PO intake, Supplement acceptance, Labs, Weight trends, Diet advancement, I & O's, TPN  ASSESSMENT:   57y/o female with h/o hypothyroidism, COVID 19 (3/21), kidney stones and stage 3 colon cancer (s/p left hemicolectomy 5/21 and chemotherapy) who is admitted for new pelvic mass now s/p laparotomy 6/8 (with excision of pelvic mass from greater omentum, abdominal wall reconstruction with bilateral myocutaneous flaps and mesh, incisional hernia repair, appendectomy repair and VAC placement) complicated by bowel perforation s/p reopening of recent laparotomy 6/10 (with repair of small bowel perforation, excision of mesh, placement of two phasix mesh and VAC placement). Pathology returned as metastatic adenocarcinoma. Pt with L1 compression fracture.   Pt continues to tolerate TPN well at goal rate; TPN is currently being cycled for 16 hrs. Triglycerides wnl and are being checked monthly. Hyperglycemia improved with insulin in TPN. Volume status being monitored; pt up ~21lbs since admission. Will check routine vitamin labs every 2-3 months as pt on chronic TPN (next due 4/9). Will  check zinc and selenium labs 3/14 as these are added in TPN. Pt continues to have poor oral intake; pt eating <25% of meals. Pt on soft diet. [name] pouch with 300 ccs + unmeasured. No discharge plan in place.   Medications reviewed and include: D2, insulin, synthroid, protonix, TPN  -Selenium 75(L), Chromium- 2.4(H), copper- 105 wnl, Manganese- 13.1 wnl, zinc- 64 wnl, vitamin D 34.2 wnl, Iodine 74.2 wnl- 2/8 -Iron 23(L), TIBC 364, ferritin 17 wnl, transferrin 261 wnl, folate 14.5 wnl, B12 585- 2/8 -Vitamin B1- 75.8, vitamin A- 27.1, B6- 21.9 wnl, vitamin C- 0.7, vitamin E- 11.6 wnl, vitamin K- 1.03 wnl- 2/8  Labs reviewed: Na 132(L), K 4.0 wnl, BUN 21(H), creat 0.42(L), P 3.3 wnl, Mg 2.0 wnl Triglycerides- 129- 3/4 Wbc- 3.2(L), Hgb 7.9(L), Hct 25.1(L) Cbgs- 151, 122, 106, 105, 157 x 48 hrs  Diet Order:    Diet Order             DIET SOFT Room service appropriate? Yes; Fluid consistency: Thin  Diet effective now                  EDUCATION NEEDS:   Not appropriate for education at this time  Skin:  Skin Assessment: Reviewed RN Assessment ( Midline wound: 11cm x 12cm )  Last BM:  3/12- type 6  Height:   Ht Readings from Last 1 Encounters:  03/01/22 '4\' 11"'$  (1.499 m)    Weight:   Wt Readings from Last 1 Encounters:  11/16/22 99 kg    Ideal Body Weight:  44.3 kg  BMI:  Body mass index is 44.08 kg/m.  Estimated Nutritional Needs:   Kcal:  1800-2100kcal/day  Protein:  90-110g/d  Fluid:  1.4-1.6L/day  Koleen Distance MS, RD, LDN Please refer to [facility] for RD and/or RD on-call/weekend/after hours pager

## 2022-11-19 LAB — GLUCOSE, CAPILLARY
Glucose-Capillary: 99 mg/dL (ref 70–99)
Glucose-Capillary: 55 mg/dL — ABNORMAL LOW (ref 70–99)
Glucose-Capillary: 148 mg/dL — ABNORMAL HIGH (ref 70–99)
Glucose-Capillary: 118 mg/dL — ABNORMAL HIGH (ref 70–99)
Glucose-Capillary: 111 mg/dL — ABNORMAL HIGH (ref 70–99)
Glucose-Capillary: 88 mg/dL (ref 70–99)

## 2022-11-19 MED ORDER — TRACE MINERALS CU-MN-SE-ZN 300-55-60-3000 MCG/ML IV SOLN
INTRAVENOUS | Status: AC
  Filled 2022-11-19: qty 672

## 2022-11-19 NOTE — Inpatient Diabetes Management (Signed)
Inpatient Diabetes Program Recommendations  AACE/ADA: New Consensus Statement on Inpatient Glycemic Control (2015)  Target Ranges:  Prepandial:   less than 140 mg/dL      Peak postprandial:   less than 180 mg/dL (1-2 hours)      Critically ill patients:  140 - 180 mg/dL   Lab Results  Component Value Date   GLUCAP 148 (H) 11/19/2022    Latest Reference Range & Units 11/17/22 17:32 11/17/22 21:18 11/18/22 04:39 11/18/22 14:33 11/18/22 18:15 11/18/22 21:17 11/18/22 22:14 11/19/22 05:58  Glucose-Capillary 70 - 99 mg/dL 106 (H) 122 (H) 151 (H) Novolog 3 units 127 (H) Novolog 3 units 99 55 (L) 75 148 (H) Novolog 3 units  (H): Data is abnormally high (L): Data is abnormally low  Inpatient Diabetes Program Recommendations:   Noted insulin in TPN decreased to 10 units. May also consider: -Decrease Novolog correction to 0-15 units 4 times daily  Thank you, Nani Gasser. Natalyah Cummiskey, RN, MSN, CDE  Diabetes Coordinator Inpatient Glycemic Control Team Team Pager [phone] (8am-5pm) 11/19/2022 11:05 AM

## 2022-11-19 NOTE — Progress Notes (Signed)
[facility] SURGICAL PROGRESS NOTE (cpt [id number])  Hospital Day(s): 279.   Post op day(s): 277 Days Post-Op.   Interval History:  Patient seen and examined No issues overnight Doing well; no acute changes  No new labs [name] output unmeasured; managing effluent independently On TPN; On Soft diet She is ambulating better; reviewed mobility notes   Review of Systems:  Constitutional: denies fever, chills  HEENT: denies cough or congestion  Respiratory: denies any shortness of breath  Cardiovascular: denies chest pain or palpitations  Gastrointestinal: denied abdominal pain, denies N/V Genitourinary: denies burning with urination or urinary frequency Integumentary: + midline wound (stable)   Vital signs in last 24 hours: [min-max] current  Temp:  [97.6 F (36.4 C)-98.5 F (36.9 C)] 98.5 F (36.9 C) (03/13 0504) Pulse Rate:  [69-88] 72 (03/13 0504) Resp:  [16-18] 18 (03/13 0504) BP: (99-134)/(46-83) 103/58 (03/13 0504) SpO2:  [98 %-100 %] 99 % (03/13 0504) Weight:  [99.1 kg] 99.1 kg (03/13 0500)     Height: '4\' 11"'$  (149.9 cm) Weight: 99.1 kg BMI (Calculated): 43.04   Intake/Output last 2 shifts:  03/12 0701 - 03/13 0700 In: 1706.8 [P.O.:300; I.V.:1406.8] Out: -    Physical Exam:  Constitutional: alert, cooperative and no distress  Respiratory: breathing non-labored at rest  Cardiovascular: regular rate and sinus rhythm  Gastrointestinal: Soft, non-tender, non-distended, no rebound/guarding. Integumentary: Midline wound open, peritoneum closed; granulating; there are three stomatized areas visible in the LUQ and LLQ portions of the wound. No active bleeding.   Labs:     Latest Ref Rng & Units 11/18/2022    4:50 AM 11/17/2022    5:30 PM 11/08/2022    4:45 AM  CBC  WBC 4.0 - 10.5 K/uL 3.2  4.2  3.7   Hemoglobin 12.0 - 15.0 g/dL 7.9  9.2  8.1   Hematocrit 36.0 - 46.0 % 25.1  28.7  26.3   Platelets 150 - 400 K/uL 104  144  120       Latest Ref Rng & Units  11/18/2022    4:50 AM 11/17/2022    5:30 PM 11/10/2022    7:10 AM  CMP  Glucose 70 - 99 mg/dL 139  117  152   BUN 6 - 20 mg/dL '21  27  21   '$ Creatinine 0.44 - 1.00 mg/dL 0.42  0.55  0.45   Sodium 135 - 145 mmol/L 132  130  134   Potassium 3.5 - 5.1 mmol/L 4.0  4.3  4.3   Chloride 98 - 111 mmol/L 104  104  106   CO2 22 - 32 mmol/L '24  22  23   '$ Calcium 8.9 - 10.3 mg/dL 8.3  8.7  8.0   Total Protein 6.5 - 8.1 g/dL 6.1  7.2  5.7   Total Bilirubin 0.3 - 1.2 mg/dL 0.4  0.7  0.5   Alkaline Phos 38 - 126 U/L 136  151  115   AST 15 - 41 U/L 37  46  42   ALT 0 - 44 U/L 33  41  32     Imaging studies: No new pertinent imaging studies this AM   Assessment/Plan:  57 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  - New 03/13: No new issues  - Wound  Care ([place]); Appreciate WOC RN assistance. No longer on intermittent or continuous suction. Patient seems to like this better and has started to manage effluent independently. She will need intermittent suction with Yankauer to control effluent. Likely be beneficial to suction before ambulation.    - Continue soft diet as tolerated  - Continue cyclic TPN; weekly nutritional labs - Appreciate dietary assistance             - Monitor abdominal examination; on-going bowel function            - Pain control prn; antemetic prn            - Progressed with therapies; no longer any recommendations; ambulating well    - Appreciate neurosurgery assistance with compression fracture; serial XRs   - Discharge Planning: Continue to anticipate lengthy admission +/- possible transfer; no new progress regarding disposition unfortunately   All of the above findings and recommendations were discussed  with the patient, and the medical team, and all of patient's questions were answered to her expressed satisfaction.  -- [name], PA-C [facility] 11/19/2022, 7:25 AM M-F: 7am - 4pm

## 2022-11-19 NOTE — Progress Notes (Signed)
PHARMACY - TOTAL PARENTERAL NUTRITION CONSULT NOTE   Indication: Prolonged ileus  Patient Measurements: Height: '4\' 11"'$  (149.9 cm) Weight: 99.1 kg (218 lb 7.6 oz) IBW/kg (Calculated) : 43.2 TPN AdjBW (KG): 55.2 Body mass index is 44.13 kg/m.  Assessment: Debra [name] is a 57 y.o. female s/p laparotomy, excision of greater omental mass, abdominal wall reconstruction with [name], appendectomy, and placement of Prevena vac.  Glucose / Insulin:  --No apparent history of diabetes --CBG checks 4x/day (0500, 1300, 1800, 2200) --BG controlled on current regimen --rSSI 4x/day (0500, 1300, 1800, 2200) --SSI last 24h: 6u  --BG/24h: 55-148 --10 units insulin in TPN >> reduced to 10 units 3/13 Electrolytes: Within normal limits Renal: SCr stable at baseline  Hepatic: Liver function overall within normal limits GI Imaging: 9/11 CTAP: no new acute issues GI Surgeries / Procedures: s/p laparotomy, excision of greater omental mass, abdominal wall reconstruction with [name], appendectomy, and placement of Prevena vac  Central access: 02/15/22 TPN start date: 02/15/22  Nutritional Goals: Goal cyclic TPN over 16 hrs: cyclic TPN over 16 hrs: (provides 102 g of protein and 1811 kcals per day) total volume over 16hrs for calculations=1400 ml (1500 ml total with overfill)  RD Assessment:  Estimated Needs Total Energy Estimated Needs: 1800-2100kcal/day Total Protein Estimated Needs: 90-110g/d Total Fluid Estimated Needs: 1.4-1.6L/day  Current Nutrition:  Soft diet + nutritional supplements, not meeting PO needs yet   Plan: Transitioned to *Cyclic* TPN on [id number].   to run over 16 hours per MD request. [name] at 2000 per discussion with dietician to allow patient time for walking in afternoon/evenings.  To run over 16 hours: -Start rate at 49 mL/hr for 1 hour. -Increase rate to 98 mL/hr for 13 hours.  -Decrease rate to 49 mL/hr for 1 hour. -Decrease rate to  25 mL/hr for 1 hour, then stop.  Plan:  See cyclic TPN rate above Continue Nutritional Components Amino acids (using 15% Clinisol): 101 g Dextrose 19% = 266 g Lipids (using 20% SMOFlipids): 50.4 g kCal: 1817/24h  Electrolytes in TPN: Na 43mq/L, K 420m/L, Ca 49m22mL, Mg 10 mEq/L, Phos 10 mmol/L  Continue ratio Cl:Ac 1:1  Continue trace elements, Hold MVI, chromium, zinc, and selenium per dietary recs for lab check. Continue CBG/SSI for Cyclic TPN:  -CBG 2 hrs after cyclic TPN start -CBG during middle of cyclic TPN infusion -CBG 1 hr after cylic TPN stopped -CBG while off TPN Continue resistant SSI and reduce insulin in TPN from 18 units to 10 units due to multiple reading below 100 in last 24 hours Check TPN labs on Mondays/once weekly ISO stable labs Since TPN not given over last 24 hours will recheck labs 3/14  HowTollie [name], PharmD Clinical Pharmacist 11/19/2022

## 2022-11-20 LAB — BASIC METABOLIC PANEL
Anion gap: 5 (ref 5–15)
BUN: 22 mg/dL — ABNORMAL HIGH (ref 6–20)
CO2: 24 mmol/L (ref 22–32)
Calcium: 8.3 mg/dL — ABNORMAL LOW (ref 8.9–10.3)
Chloride: 105 mmol/L (ref 98–111)
Creatinine, Ser: 0.46 mg/dL (ref 0.44–1.00)
GFR, Estimated: >60 mL/min (ref >60)
Glucose, Bld: 124 mg/dL — ABNORMAL HIGH (ref 70–99)
Potassium: 4.5 mmol/L (ref 3.5–5.1)
Sodium: 134 mmol/L — ABNORMAL LOW (ref 135–145)

## 2022-11-20 LAB — GLUCOSE, CAPILLARY
Glucose-Capillary: 168 mg/dL — ABNORMAL HIGH (ref 70–99)
Glucose-Capillary: 113 mg/dL — ABNORMAL HIGH (ref 70–99)
Glucose-Capillary: 139 mg/dL — ABNORMAL HIGH (ref 70–99)
Glucose-Capillary: 112 mg/dL — ABNORMAL HIGH (ref 70–99)

## 2022-11-20 LAB — MAGNESIUM: Magnesium: 2.1 mg/dL (ref 1.7–2.4)

## 2022-11-20 LAB — PHOSPHORUS: Phosphorus: 3.6 mg/dL (ref 2.5–4.6)

## 2022-11-20 MED ORDER — STERILE WATER FOR INJECTION IV SOLN
INTRAVENOUS | Status: AC
  Filled 2022-11-20: qty 672

## 2022-11-20 MED ORDER — INSULIN ASPART 100 UNIT/ML IJ SOLN
0.0000 [IU] | Freq: Four times a day (QID) | INTRAMUSCULAR | Status: DC
  Administered 2022-11-20 – 2022-11-21 (×2): 2 [IU] via SUBCUTANEOUS
  Administered 2022-11-21: 3 [IU] via SUBCUTANEOUS
  Administered 2022-11-21: 5 [IU] via SUBCUTANEOUS
  Administered 2022-11-22: 3 [IU] via SUBCUTANEOUS
  Administered 2022-11-22: 2 [IU] via SUBCUTANEOUS
  Administered 2022-11-22 – 2022-11-23 (×2): 3 [IU] via SUBCUTANEOUS
  Administered 2022-11-23: 2 [IU] via SUBCUTANEOUS
  Administered 2022-11-23: 3 [IU] via SUBCUTANEOUS
  Administered 2022-11-24: 2 [IU] via SUBCUTANEOUS
  Administered 2022-11-24: 3 [IU] via SUBCUTANEOUS
  Administered 2022-11-24 – 2022-11-27 (×5): 2 [IU] via SUBCUTANEOUS
  Administered 2022-11-27: 3 [IU] via SUBCUTANEOUS
  Administered 2022-11-28 – 2022-11-29 (×2): 2 [IU] via SUBCUTANEOUS
  Administered 2022-11-30 – 2022-12-01 (×2): 3 [IU] via SUBCUTANEOUS
  Administered 2022-12-01 (×2): 2 [IU] via SUBCUTANEOUS
  Administered 2022-12-02: 3 [IU] via SUBCUTANEOUS
  Administered 2022-12-02: 2 [IU] via SUBCUTANEOUS
  Administered 2022-12-03: 3 [IU] via SUBCUTANEOUS
  Administered 2022-12-03 – 2022-12-04 (×3): 2 [IU] via SUBCUTANEOUS
  Administered 2022-12-04: 3 [IU] via SUBCUTANEOUS
  Administered 2022-12-05 – 2022-12-07 (×6): 2 [IU] via SUBCUTANEOUS
  Administered 2022-12-08 (×3): 3 [IU] via SUBCUTANEOUS
  Administered 2022-12-09: 2 [IU] via SUBCUTANEOUS
  Administered 2022-12-09 – 2022-12-10 (×3): 3 [IU] via SUBCUTANEOUS
  Administered 2022-12-10 – 2022-12-12 (×5): 2 [IU] via SUBCUTANEOUS
  Administered 2022-12-13: 5 [IU] via SUBCUTANEOUS
  Administered 2022-12-13 – 2022-12-14 (×3): 2 [IU] via SUBCUTANEOUS
  Administered 2022-12-14: 3 [IU] via SUBCUTANEOUS
  Administered 2022-12-14: 2 [IU] via SUBCUTANEOUS
  Administered 2022-12-14: 3 [IU] via SUBCUTANEOUS
  Administered 2022-12-15: 2 [IU] via SUBCUTANEOUS
  Administered 2022-12-15: 3 [IU] via SUBCUTANEOUS
  Administered 2022-12-16: 2 [IU] via SUBCUTANEOUS
  Administered 2022-12-17 – 2022-12-18 (×3): 3 [IU] via SUBCUTANEOUS
  Administered 2022-12-18 – 2022-12-19 (×3): 2 [IU] via SUBCUTANEOUS
  Administered 2022-12-19: 3 [IU] via SUBCUTANEOUS
  Administered 2022-12-20 (×2): 2 [IU] via SUBCUTANEOUS
  Administered 2022-12-20: 11 [IU] via SUBCUTANEOUS
  Administered 2022-12-21: 2 [IU] via SUBCUTANEOUS
  Administered 2022-12-21: 3 [IU] via SUBCUTANEOUS
  Administered 2022-12-21: 2 [IU] via SUBCUTANEOUS
  Administered 2022-12-21: 3 [IU] via SUBCUTANEOUS
  Administered 2022-12-22: 2 [IU] via SUBCUTANEOUS
  Administered 2022-12-22: 3 [IU] via SUBCUTANEOUS
  Administered 2022-12-22: 2 [IU] via SUBCUTANEOUS
  Administered 2022-12-22 – 2022-12-23 (×3): 3 [IU] via SUBCUTANEOUS
  Administered 2022-12-25: 2 [IU] via SUBCUTANEOUS
  Administered 2022-12-25 (×2): 3 [IU] via SUBCUTANEOUS
  Administered 2022-12-25: 2 [IU] via SUBCUTANEOUS
  Administered 2022-12-26 (×2): 3 [IU] via SUBCUTANEOUS
  Administered 2022-12-26: 5 [IU] via SUBCUTANEOUS
  Administered 2022-12-26: 2 [IU] via SUBCUTANEOUS
  Administered 2022-12-27 (×2): 5 [IU] via SUBCUTANEOUS
  Administered 2022-12-27 – 2022-12-29 (×5): 3 [IU] via SUBCUTANEOUS
  Administered 2022-12-29 – 2022-12-30 (×2): 5 [IU] via SUBCUTANEOUS
  Administered 2022-12-30 – 2022-12-31 (×2): 2 [IU] via SUBCUTANEOUS
  Administered 2022-12-31: 3 [IU] via SUBCUTANEOUS
  Administered 2022-12-31 – 2023-01-01 (×2): 2 [IU] via SUBCUTANEOUS
  Administered 2023-01-01 – 2023-01-02 (×3): 3 [IU] via SUBCUTANEOUS
  Administered 2023-01-03 – 2023-01-04 (×4): 2 [IU] via SUBCUTANEOUS
  Administered 2023-01-04 – 2023-01-06 (×2): 3 [IU] via SUBCUTANEOUS
  Administered 2023-01-07: 2 [IU] via SUBCUTANEOUS
  Administered 2023-01-07: 3 [IU] via SUBCUTANEOUS
  Administered 2023-01-07: 2 [IU] via SUBCUTANEOUS
  Administered 2023-01-08 – 2023-01-09 (×2): 3 [IU] via SUBCUTANEOUS
  Administered 2023-01-09 (×2): 2 [IU] via SUBCUTANEOUS
  Administered 2023-01-09 – 2023-01-11 (×4): 3 [IU] via SUBCUTANEOUS
  Administered 2023-01-11 – 2023-01-12 (×2): 2 [IU] via SUBCUTANEOUS
  Administered 2023-01-12 – 2023-01-13 (×2): 5 [IU] via SUBCUTANEOUS
  Administered 2023-01-13 – 2023-01-14 (×2): 2 [IU] via SUBCUTANEOUS
  Administered 2023-01-14 – 2023-01-15 (×3): 3 [IU] via SUBCUTANEOUS
  Administered 2023-01-15: 2 [IU] via SUBCUTANEOUS
  Administered 2023-01-16 (×2): 3 [IU] via SUBCUTANEOUS
  Administered 2023-01-17 (×2): 2 [IU] via SUBCUTANEOUS
  Administered 2023-01-17 – 2023-01-19 (×5): 3 [IU] via SUBCUTANEOUS
  Administered 2023-01-19 (×2): 2 [IU] via SUBCUTANEOUS
  Administered 2023-01-20 – 2023-01-21 (×2): 3 [IU] via SUBCUTANEOUS
  Administered 2023-01-22: 2 [IU] via SUBCUTANEOUS
  Administered 2023-01-22: 3 [IU] via SUBCUTANEOUS
  Administered 2023-01-23 (×2): 2 [IU] via SUBCUTANEOUS
  Administered 2023-01-24 (×2): 3 [IU] via SUBCUTANEOUS
  Administered 2023-01-24: 2 [IU] via SUBCUTANEOUS
  Administered 2023-01-25: 3 [IU] via SUBCUTANEOUS
  Administered 2023-01-25: 2 [IU] via SUBCUTANEOUS
  Administered 2023-01-26 – 2023-01-27 (×4): 3 [IU] via SUBCUTANEOUS
  Administered 2023-01-27 (×2): 2 [IU] via SUBCUTANEOUS
  Administered 2023-01-28: 3 [IU] via SUBCUTANEOUS
  Administered 2023-01-28 – 2023-01-29 (×2): 2 [IU] via SUBCUTANEOUS
  Administered 2023-01-29 – 2023-01-31 (×6): 3 [IU] via SUBCUTANEOUS
  Administered 2023-01-31: 5 [IU] via SUBCUTANEOUS
  Administered 2023-02-01: 2 [IU] via SUBCUTANEOUS
  Administered 2023-02-01: 3 [IU] via SUBCUTANEOUS
  Administered 2023-02-01: 2 [IU] via SUBCUTANEOUS
  Administered 2023-02-02: 6 [IU] via SUBCUTANEOUS
  Administered 2023-02-02 (×2): 2 [IU] via SUBCUTANEOUS
  Administered 2023-02-03: 3 [IU] via SUBCUTANEOUS
  Administered 2023-02-03: 5 [IU] via SUBCUTANEOUS
  Administered 2023-02-03: 3 [IU] via SUBCUTANEOUS
  Administered 2023-02-04: 2 [IU] via SUBCUTANEOUS
  Administered 2023-02-04: 5 [IU] via SUBCUTANEOUS
  Administered 2023-02-04: 2 [IU] via SUBCUTANEOUS
  Administered 2023-02-05: 5 [IU] via SUBCUTANEOUS
  Administered 2023-02-05 (×3): 2 [IU] via SUBCUTANEOUS
  Administered 2023-02-06: 3 [IU] via SUBCUTANEOUS
  Administered 2023-02-06: 2 [IU] via SUBCUTANEOUS
  Administered 2023-02-06 – 2023-02-07 (×2): 3 [IU] via SUBCUTANEOUS
  Administered 2023-02-07: 5 [IU] via SUBCUTANEOUS
  Administered 2023-02-07: 2 [IU] via SUBCUTANEOUS
  Administered 2023-02-08: 5 [IU] via SUBCUTANEOUS
  Administered 2023-02-08: 11 [IU] via SUBCUTANEOUS
  Administered 2023-02-08 – 2023-02-09 (×2): 2 [IU] via SUBCUTANEOUS
  Administered 2023-02-09 (×2): 5 [IU] via SUBCUTANEOUS
  Administered 2023-02-10 (×2): 3 [IU] via SUBCUTANEOUS
  Administered 2023-02-11: 2 [IU] via SUBCUTANEOUS
  Administered 2023-02-11 (×2): 3 [IU] via SUBCUTANEOUS
  Administered 2023-02-11: 2 [IU] via SUBCUTANEOUS
  Administered 2023-02-12 (×3): 3 [IU] via SUBCUTANEOUS
  Administered 2023-02-13: 2 [IU] via SUBCUTANEOUS
  Administered 2023-02-13: 5 [IU] via SUBCUTANEOUS
  Administered 2023-02-13: 3 [IU] via SUBCUTANEOUS
  Administered 2023-02-13: 2 [IU] via SUBCUTANEOUS
  Administered 2023-02-14: 5 [IU] via SUBCUTANEOUS
  Administered 2023-02-14: 2 [IU] via SUBCUTANEOUS
  Administered 2023-02-14: 3 [IU] via SUBCUTANEOUS
  Administered 2023-02-14: 5 [IU] via SUBCUTANEOUS
  Administered 2023-02-15 (×2): 3 [IU] via SUBCUTANEOUS
  Administered 2023-02-15: 2 [IU] via SUBCUTANEOUS
  Administered 2023-02-16 (×2): 3 [IU] via SUBCUTANEOUS
  Administered 2023-02-16: 2 [IU] via SUBCUTANEOUS
  Administered 2023-02-17: 3 [IU] via SUBCUTANEOUS
  Administered 2023-02-17: 2 [IU] via SUBCUTANEOUS
  Administered 2023-02-18: 3 [IU] via SUBCUTANEOUS
  Administered 2023-02-18: 2 [IU] via SUBCUTANEOUS
  Administered 2023-02-19 (×2): 5 [IU] via SUBCUTANEOUS
  Administered 2023-02-19: 3 [IU] via SUBCUTANEOUS
  Administered 2023-02-20: 5 [IU] via SUBCUTANEOUS
  Administered 2023-02-20: 3 [IU] via SUBCUTANEOUS
  Administered 2023-02-20: 2 [IU] via SUBCUTANEOUS
  Administered 2023-02-20: 3 [IU] via SUBCUTANEOUS
  Administered 2023-02-21: 2 [IU] via SUBCUTANEOUS
  Administered 2023-02-21: 3 [IU] via SUBCUTANEOUS
  Administered 2023-02-21: 2 [IU] via SUBCUTANEOUS
  Administered 2023-02-22: 3 [IU] via SUBCUTANEOUS
  Administered 2023-02-22 – 2023-02-23 (×2): 5 [IU] via SUBCUTANEOUS
  Administered 2023-02-23: 3 [IU] via SUBCUTANEOUS
  Administered 2023-02-24: 2 [IU] via SUBCUTANEOUS
  Administered 2023-02-24: 3 [IU] via SUBCUTANEOUS
  Administered 2023-02-24: 5 [IU] via SUBCUTANEOUS
  Administered 2023-02-25: 2 [IU] via SUBCUTANEOUS
  Administered 2023-02-25 (×2): 3 [IU] via SUBCUTANEOUS
  Administered 2023-02-26: 5 [IU] via SUBCUTANEOUS
  Administered 2023-02-26: 3 [IU] via SUBCUTANEOUS
  Administered 2023-02-26: 2 [IU] via SUBCUTANEOUS
  Administered 2023-02-27: 3 [IU] via SUBCUTANEOUS
  Administered 2023-02-27: 5 [IU] via SUBCUTANEOUS
  Administered 2023-02-27: 2 [IU] via SUBCUTANEOUS
  Administered 2023-02-28: 3 [IU] via SUBCUTANEOUS
  Administered 2023-02-28 (×2): 2 [IU] via SUBCUTANEOUS
  Administered 2023-02-28: 3 [IU] via SUBCUTANEOUS
  Administered 2023-03-01: 2 [IU] via SUBCUTANEOUS
  Administered 2023-03-01: 3 [IU] via SUBCUTANEOUS
  Administered 2023-03-01 (×2): 2 [IU] via SUBCUTANEOUS
  Administered 2023-03-02 (×2): 3 [IU] via SUBCUTANEOUS
  Administered 2023-03-02: 5 [IU] via SUBCUTANEOUS
  Administered 2023-03-03: 3 [IU] via SUBCUTANEOUS
  Administered 2023-03-03: 5 [IU] via SUBCUTANEOUS
  Administered 2023-03-03: 3 [IU] via SUBCUTANEOUS
  Administered 2023-03-04 (×2): 5 [IU] via SUBCUTANEOUS
  Filled 2022-11-20 (×268): qty 1

## 2022-11-20 NOTE — Progress Notes (Signed)
PHARMACY - TOTAL PARENTERAL NUTRITION CONSULT NOTE   Indication: Prolonged ileus  Patient Measurements: Height: '4\' 11"'$  (149.9 cm) Weight: 99.3 kg (218 lb 14.7 oz) IBW/kg (Calculated) : 43.2 TPN AdjBW (KG): 55.2 Body mass index is 44.22 kg/m.  Assessment: Debra [name] is a 57 y.o. female s/p laparotomy, excision of greater omental mass, abdominal wall reconstruction with [name], appendectomy, and placement of Prevena vac.  Glucose / Insulin:  --No apparent history of diabetes --CBG checks 4x/day (0500, 1300, 1800, 2200) --BG controlled on current regimen --rSSI 4x/day (0500, 1300, 1800, 2200) --SSI last 24h: 4u  --BG/24h: 88-168 --10 units insulin in TPN >> reduced to 10 units 3/13 Electrolytes: Within normal limits Renal: SCr stable at baseline  Hepatic: Liver function overall within normal limits GI Imaging: 9/11 CTAP: no new acute issues GI Surgeries / Procedures: s/p laparotomy, excision of greater omental mass, abdominal wall reconstruction with [name], appendectomy, and placement of Prevena vac  Central access: 02/15/22 TPN start date: 02/15/22  Nutritional Goals: Goal cyclic TPN over 16 hrs: cyclic TPN over 16 hrs: (provides 102 g of protein and 1811 kcals per day) total volume over 16hrs for calculations=1400 ml (1500 ml total with overfill)  RD Assessment:  Estimated Needs Total Energy Estimated Needs: 1800-2100kcal/day Total Protein Estimated Needs: 90-110g/d Total Fluid Estimated Needs: 1.4-1.6L/day  Current Nutrition:  Soft diet + nutritional supplements, not meeting PO needs yet   Plan: Transitioned to *Cyclic* TPN on [id number].   to run over 16 hours per MD request. [name] at 2000 per discussion with dietician to allow patient time for walking in afternoon/evenings.  To run over 16 hours: -Start rate at 49 mL/hr for 1 hour. -Increase rate to 98 mL/hr for 13 hours.  -Decrease rate to 49 mL/hr for 1 hour. -Decrease rate to  25 mL/hr for 1 hour, then stop.  Plan:  See cyclic TPN rate above Continue Nutritional Components Amino acids (using 15% Clinisol): 101 g Dextrose 19% = 266 g Lipids (using 20% SMOFlipids): 50.4 g kCal: 1817/24h  Electrolytes in TPN: Na 32mq/L, K 4428m/L, Ca 28m24mL, Mg 10 mEq/L, Phos 10 mmol/L  Continue ratio Cl:Ac 1:1  Continue trace elements, Hold MVI, chromium, zinc, and selenium, and trace elements per dietary recs for lab check. Continue CBG/SSI for Cyclic TPN:  -CBG 2 hrs after cyclic TPN start -CBG during middle of cyclic TPN infusion -CBG 1 hr after cylic TPN stopped -CBG while off TPN Change SSI insulin to moderate scale and continue TPN with 10 units of insulin Check TPN labs on Mondays/once weekly ISO stable labs  HowTollie [name], PharmD Clinical Pharmacist 11/20/2022

## 2022-11-20 NOTE — Progress Notes (Signed)
[facility] SURGICAL PROGRESS NOTE (cpt [phone])  Hospital Day(s): 280.   Post op day(s): 278 Days Post-Op.   Interval History:  Patient seen and examined No issues overnight Room changed secondary to maintenance issues Doing well; no acute changes  No new labs this morning  [name] output unmeasured; managing effluent independently On TPN; On Soft diet She is ambulating better; reviewed mobility notes   Review of Systems:  Constitutional: denies fever, chills  HEENT: denies cough or congestion  Respiratory: denies any shortness of breath  Cardiovascular: denies chest pain or palpitations  Gastrointestinal: denied abdominal pain, denies N/V Genitourinary: denies burning with urination or urinary frequency Integumentary: + midline wound (stable)   Vital signs in last 24 hours: [min-max] current  Temp:  [97.9 F (36.6 C)-98.4 F (36.9 C)] 97.9 F (36.6 C) (03/14 0502) Pulse Rate:  [72-83] 83 (03/14 0502) Resp:  [15-18] 16 (03/14 0502) BP: (104-109)/(57-60) 104/59 (03/14 0502) SpO2:  [96 %-100 %] 96 % (03/14 0502) Weight:  [99.3 kg] 99.3 kg (03/14 0506)     Height: '4\' 11"'$  (149.9 cm) Weight: 99.3 kg BMI (Calculated): 43.04   Intake/Output last 2 shifts:  03/13 0701 - 03/14 0700 In: 2334.4 [I.V.:2334.4] Out: -    Physical Exam:  Constitutional: alert, cooperative and no distress  Respiratory: breathing non-labored at rest  Cardiovascular: regular rate and sinus rhythm  Gastrointestinal: Soft, non-tender, non-distended, no rebound/guarding. Integumentary: Midline wound open, peritoneum closed; granulating; there are three stomatized areas visible in the LUQ and LLQ portions of the wound. No active bleeding.   Labs:     Latest Ref Rng & Units 11/18/2022    4:50 AM 11/17/2022    5:30 PM 11/08/2022    4:45 AM  CBC  WBC 4.0 - 10.5 K/uL 3.2  4.2  3.7   Hemoglobin 12.0 - 15.0 g/dL 7.9  9.2  8.1   Hematocrit 36.0 - 46.0 % 25.1  28.7  26.3   Platelets 150 - 400  K/uL 104  144  120       Latest Ref Rng & Units 11/18/2022    4:50 AM 11/17/2022    5:30 PM 11/10/2022    7:10 AM  CMP  Glucose 70 - 99 mg/dL 139  117  152   BUN 6 - 20 mg/dL '21  27  21   '$ Creatinine 0.44 - 1.00 mg/dL 0.42  0.55  0.45   Sodium 135 - 145 mmol/L 132  130  134   Potassium 3.5 - 5.1 mmol/L 4.0  4.3  4.3   Chloride 98 - 111 mmol/L 104  104  106   CO2 22 - 32 mmol/L '24  22  23   '$ Calcium 8.9 - 10.3 mg/dL 8.3  8.7  8.0   Total Protein 6.5 - 8.1 g/dL 6.1  7.2  5.7   Total Bilirubin 0.3 - 1.2 mg/dL 0.4  0.7  0.5   Alkaline Phos 38 - 126 U/L 136  151  115   AST 15 - 41 U/L 37  46  42   ALT 0 - 44 U/L 33  41  32     Imaging studies: No new pertinent imaging studies this AM   Assessment/Plan:  57 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  -  New 03/14: Nothing new; recheck CBC/BMP tomorrow morning   - Wound Care ([place]); Appreciate WOC RN assistance. No longer on intermittent or continuous suction. Patient seems to like this better and has started to manage effluent independently. She will need intermittent suction with Yankauer to control effluent. Likely be beneficial to suction before ambulation.    - Continue soft diet as tolerated  - Continue cyclic TPN; weekly nutritional labs - Appreciate dietary assistance             - Monitor abdominal examination; on-going bowel function            - Pain control prn; antemetic prn            - Progressed with therapies; no longer any recommendations; ambulating well    - Appreciate neurosurgery assistance with compression fracture; serial XRs   - Discharge Planning: Continue to anticipate lengthy admission +/- possible transfer; no new progress regarding  disposition unfortunately   All of the above findings and recommendations were discussed with the patient, and the medical team, and all of patient's questions were answered to her expressed satisfaction.  -- [name], PA-C [facility] 11/20/2022, 7:43 AM M-F: 7am - 4pm

## 2022-11-21 LAB — CBC
HCT: 30.1 % — ABNORMAL LOW (ref 36.0–46.0)
Hemoglobin: 9.3 g/dL — ABNORMAL LOW (ref 12.0–15.0)
MCH: 28.9 pg (ref 26.0–34.0)
MCHC: 30.9 g/dL (ref 30.0–36.0)
MCV: 93.5 fL (ref 80.0–100.0)
Platelets: 130 10*3/uL — ABNORMAL LOW (ref 150–400)
RBC: 3.22 MIL/uL — ABNORMAL LOW (ref 3.87–5.11)
RDW: 17 % — ABNORMAL HIGH (ref 11.5–15.5)
WBC: 4.2 10*3/uL (ref 4.0–10.5)
nRBC: 0 % (ref 0.0–0.2)

## 2022-11-21 LAB — GLUCOSE, CAPILLARY
Glucose-Capillary: 117 mg/dL — ABNORMAL HIGH (ref 70–99)
Glucose-Capillary: 151 mg/dL — ABNORMAL HIGH (ref 70–99)

## 2022-11-21 MED ORDER — ZINC CHLORIDE 1 MG/ML IV SOLN
INTRAVENOUS | Status: AC
  Filled 2022-11-21: qty 672

## 2022-11-21 NOTE — Progress Notes (Signed)
[facility] SURGICAL PROGRESS NOTE (cpt [phone])  Hospital Day(s): 281.   Post op day(s): 279 Days Post-Op.   Interval History:  Patient seen and examined No issues overnight Doing well; no acute changes  No new labs this morning  [name] output unmeasured; managing effluent independently On TPN; On Soft diet She is ambulating better; reviewed mobility notes   Review of Systems:  Constitutional: denies fever, chills  HEENT: denies cough or congestion  Respiratory: denies any shortness of breath  Cardiovascular: denies chest pain or palpitations  Gastrointestinal: denied abdominal pain, denies N/V Genitourinary: denies burning with urination or urinary frequency Integumentary: + midline wound (stable)   Vital signs in last 24 hours: [min-max] current  Temp:  [98.3 F (36.8 C)-99 F (37.2 C)] 98.6 F (37 C) (03/15 0755) Pulse Rate:  [69-80] 79 (03/15 0755) Resp:  [15-22] 16 (03/15 0755) BP: (89-109)/(50-62) 98/58 (03/15 0755) SpO2:  [97 %-99 %] 98 % (03/15 0755) Weight:  [98.8 kg] 98.8 kg (03/15 0755)     Height: 4\' 11"  (149.9 cm) Weight: 98.8 kg BMI (Calculated): 43.04   Intake/Output last 2 shifts:  03/14 0701 - 03/15 0700 In: 777.8 [I.V.:777.8] Out: -    Physical Exam:  Constitutional: alert, cooperative and no distress  Respiratory: breathing non-labored at rest  Cardiovascular: regular rate and sinus rhythm  Gastrointestinal: Soft, non-tender, non-distended, no rebound/guarding. Integumentary: Midline wound open, peritoneum closed; granulating; there are three stomatized areas visible in the LUQ and LLQ portions of the wound. No active bleeding.   Labs:     Latest Ref Rng & Units 11/18/2022    4:50 AM 11/17/2022    5:30 PM 11/08/2022    4:45 AM  CBC  WBC 4.0 - 10.5 K/uL 3.2  4.2  3.7   Hemoglobin 12.0 - 15.0 g/dL 7.9  9.2  8.1   Hematocrit 36.0 - 46.0 % 25.1  28.7  26.3   Platelets 150 - 400 K/uL 104  144  120       Latest Ref Rng & Units  11/20/2022   10:07 AM 11/18/2022    4:50 AM 11/17/2022    5:30 PM  CMP  Glucose 70 - 99 mg/dL 124  139  117   BUN 6 - 20 mg/dL 22  21  27    Creatinine 0.44 - 1.00 mg/dL 0.46  0.42  0.55   Sodium 135 - 145 mmol/L 134  132  130   Potassium 3.5 - 5.1 mmol/L 4.5  4.0  4.3   Chloride 98 - 111 mmol/L 105  104  104   CO2 22 - 32 mmol/L 24  24  22    Calcium 8.9 - 10.3 mg/dL 8.3  8.3  8.7   Total Protein 6.5 - 8.1 g/dL  6.1  7.2   Total Bilirubin 0.3 - 1.2 mg/dL  0.4  0.7   Alkaline Phos 38 - 126 U/L  136  151   AST 15 - 41 U/L  37  46   ALT 0 - 44 U/L  33  41     Imaging studies: No new pertinent imaging studies this AM   Assessment/Plan:  57 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  - New 03/15: Nothing new; recheck CBC/BMP pending  - Wound Care ([name]  Pouch); Appreciate WOC RN assistance. No longer on intermittent or continuous suction. Patient seems to like this better and has started to manage effluent independently. She will need intermittent suction with Yankauer to control effluent. Likely be beneficial to suction before ambulation.    - Continue soft diet as tolerated  - Continue cyclic TPN; weekly nutritional labs - Appreciate dietary assistance             - Monitor abdominal examination; on-going bowel function            - Pain control prn; antemetic prn            - Progressed with therapies; no longer any recommendations; ambulating well    - Appreciate neurosurgery assistance with compression fracture; serial XRs   - Discharge Planning: Continue to anticipate lengthy admission +/- possible transfer; no new progress regarding disposition unfortunately   All of the above findings and recommendations were  discussed with the patient, and the medical team, and all of patient's questions were answered to her expressed satisfaction.  -- [name], PA-C [facility] 11/21/2022, 9:20 AM M-F: 7am - 4pm

## 2022-11-21 NOTE — Progress Notes (Signed)
PHARMACY - TOTAL PARENTERAL NUTRITION CONSULT NOTE   Indication: Prolonged ileus  Patient Measurements: Height: 4\' 11"  (149.9 cm) Weight: 99.3 kg (218 lb 14.7 oz) IBW/kg (Calculated) : 43.2 TPN AdjBW (KG): 55.2 Body mass index is 44.22 kg/m.  Assessment: Debra [name] is a 57 y.o. female s/p laparotomy, excision of greater omental mass, abdominal wall reconstruction with [name], appendectomy, and placement of Prevena vac.  Glucose / Insulin:  --No apparent history of diabetes --CBG checks 4x/day (0500, 1300, 1800, 2200) --BG controlled on current regimen --rSSI 4x/day (0500, 1300, 1800, 2200) --SSI last 24h: 4u  --BG/24h: 112-139 --10 units insulin in TPN Electrolytes: Within normal limits Renal: SCr stable at baseline  Hepatic: Liver function overall within normal limits GI Imaging: 9/11 CTAP: no new acute issues GI Surgeries / Procedures: s/p laparotomy, excision of greater omental mass, abdominal wall reconstruction with [name], appendectomy, and placement of Prevena vac  Central access: 02/15/22 TPN start date: 02/15/22  Nutritional Goals: Goal cyclic TPN over 16 hrs: cyclic TPN over 16 hrs: (provides 102 g of protein and 1811 kcals per day) total volume over 16hrs for calculations=1400 ml (1500 ml total with overfill)  RD Assessment:  Estimated Needs Total Energy Estimated Needs: 1800-2100kcal/day Total Protein Estimated Needs: 90-110g/d Total Fluid Estimated Needs: 1.4-1.6L/day  Current Nutrition:  Soft diet + nutritional supplements, not meeting PO needs yet   Plan: Transitioned to *Cyclic* TPN on [id number].   to run over 16 hours per MD request. [name] at 2000 per discussion with dietician to allow patient time for walking in afternoon/evenings.  To run over 16 hours: -Start rate at 49 mL/hr for 1 hour. -Increase rate to 98 mL/hr for 13 hours.  -Decrease rate to 49 mL/hr for 1 hour. -Decrease rate to 25 mL/hr for 1 hour, then  stop.  Plan:  See cyclic TPN rate above Continue Nutritional Components Amino acids (using 15% Clinisol): 101 g Dextrose 19% = 266 g Lipids (using 20% SMOFlipids): 50.4 g kCal: 1817/24h  Electrolytes in TPN: Na 36mEq/L, K 62mEq/L, Ca 71mEq/L, Mg 10 mEq/L, Phos 10 mmol/L  Continue ratio Cl:Ac 1:1  Continue trace elements, MVI, chromium, zinc and selenium per dietary recs Continue CBG/SSI for Cyclic TPN:  -CBG 2 hrs after cyclic TPN start -CBG during middle of cyclic TPN infusion -CBG 1 hr after cylic TPN stopped -CBG while off TPN Continue SSI insulin at moderate scale and continue TPN with 10 units of insulin Check TPN labs on Mondays/once weekly ISO stable labs  Tollie Eth III, PharmD Clinical Pharmacist 11/21/2022

## 2022-11-21 NOTE — Plan of Care (Signed)
  Problem: Nutrition: Goal: Adequate nutrition will be maintained Outcome: Progressing   Problem: Activity: Goal: Risk for activity intolerance will decrease Outcome: Progressing   Problem: Coping: Goal: Level of anxiety will decrease Outcome: Progressing   Problem: Pain Managment: Goal: General experience of comfort will improve Outcome: Progressing   

## 2022-11-21 NOTE — Progress Notes (Addendum)
[name] pouch leaking. Pouch changed.   Fuller Mandril, RN

## 2022-11-22 LAB — CBC
HCT: 25.6 % — ABNORMAL LOW (ref 36.0–46.0)
Hemoglobin: 8 g/dL — ABNORMAL LOW (ref 12.0–15.0)
MCH: 29.1 pg (ref 26.0–34.0)
MCHC: 31.3 g/dL (ref 30.0–36.0)
MCV: 93.1 fL (ref 80.0–100.0)
Platelets: 118 10*3/uL — ABNORMAL LOW (ref 150–400)
RBC: 2.75 MIL/uL — ABNORMAL LOW (ref 3.87–5.11)
RDW: 17.1 % — ABNORMAL HIGH (ref 11.5–15.5)
WBC: 3.2 10*3/uL — ABNORMAL LOW (ref 4.0–10.5)
nRBC: 0 % (ref 0.0–0.2)

## 2022-11-22 LAB — GLUCOSE, CAPILLARY
Glucose-Capillary: 83 mg/dL (ref 70–99)
Glucose-Capillary: 146 mg/dL — ABNORMAL HIGH (ref 70–99)

## 2022-11-22 MED ORDER — ZINC CHLORIDE 1 MG/ML IV SOLN
INTRAVENOUS | Status: AC
  Filled 2022-11-22: qty 672

## 2022-11-22 NOTE — Progress Notes (Signed)
Informed Dr. Dahlia Byes - on call - patient's fistula output was 100 ml of dark red blood with clots.

## 2022-11-22 NOTE — Progress Notes (Signed)
Seen and examined.  Had some minor clots this morning.  It is not unusual she does this every week or so.  Upon examination today there is no active bleeding she does have a leak on the lower portion of the [name] pouch. Gust with nursing about changing of [name] pouch today.  Continue TPN and wound care

## 2022-11-22 NOTE — Progress Notes (Addendum)
PHARMACY - TOTAL PARENTERAL NUTRITION CONSULT NOTE   Indication: Prolonged ileus  Patient Measurements: Height: 4\' 11"  (149.9 cm) Weight: 97 kg (213 lb 13.5 oz) IBW/kg (Calculated) : 43.2 TPN AdjBW (KG): 55.2 Body mass index is 43.19 kg/m.  Assessment: Debra [name] is a 57 y.o. female s/p laparotomy, excision of greater omental mass, abdominal wall reconstruction with [name], appendectomy, and placement of Prevena vac.  Glucose / Insulin:  --No apparent history of diabetes --CBG checks 4x/day (0500, 1300, 1800, 2200) --BG controlled on current regimen --rSSI 4x/day (0500, 1300, 1800, 2200) --SSI last 24h: 4u  --BG/24h: 117-151 --11 units insulin in TPN Electrolytes: Within normal limits Renal: SCr stable at baseline  Hepatic: Liver function overall within normal limits GI Imaging: 9/11 CTAP: no new acute issues GI Surgeries / Procedures: s/p laparotomy, excision of greater omental mass, abdominal wall reconstruction with [name], appendectomy, and placement of Prevena vac  Central access: 02/15/22 TPN start date: 02/15/22  Nutritional Goals: Goal cyclic TPN over 16 hrs: cyclic TPN over 16 hrs: (provides 102 g of protein and 1811 kcals per day) total volume over 16hrs for calculations=1400 ml (1500 ml total with overfill)  RD Assessment:  Estimated Needs Total Energy Estimated Needs: 1800-2100kcal/day Total Protein Estimated Needs: 90-110g/d Total Fluid Estimated Needs: 1.4-1.6L/day  Current Nutrition:  Soft diet + nutritional supplements, not meeting PO needs yet   Plan: Transitioned to *Cyclic* TPN on [id number].   to run over 16 hours per MD request. [name] at 2000 per discussion with dietician to allow patient time for walking in afternoon/evenings.  To run over 16 hours: -Start rate at 49 mL/hr for 1 hour. -Increase rate to 98 mL/hr for 13 hours.  -Decrease rate to 49 mL/hr for 1 hour. -Decrease rate to 25 mL/hr for 1 hour, then  stop.  Plan:  See cyclic TPN rate above Continue Nutritional Components Amino acids (using 15% Clinisol): 101 g Dextrose 19% = 266 g Lipids (using 20% SMOFlipids): 50.4 g kCal: 1817/24h  Electrolytes in TPN: Na 76mEq/L, K 56mEq/L, Ca 92mEq/L, Mg 10 mEq/L, Phos 10 mmol/L  Continue ratio Cl:Ac 1:1  Continue trace elements, MVI, chromium, zinc and selenium per dietary recs Continue CBG/SSI for Cyclic TPN:  -CBG 2 hrs after cyclic TPN start -CBG during middle of cyclic TPN infusion -CBG 1 hr after cylic TPN stopped -CBG while off TPN Continue SSI insulin at moderate scale and continue TPN with 10 units of insulin Check TPN labs on Mondays/once weekly ISO stable labs  [name], PharmD Clinical Pharmacist 11/22/2022 10:57 AM

## 2022-11-23 LAB — GLUCOSE, CAPILLARY: Glucose-Capillary: 125 mg/dL — ABNORMAL HIGH (ref 70–99)

## 2022-11-23 MED ORDER — ZINC CHLORIDE 1 MG/ML IV SOLN
INTRAVENOUS | Status: AC
  Filled 2022-11-23: qty 672

## 2022-11-23 NOTE — Progress Notes (Signed)
Pt seen and examined, no major issues  Sanguinous output subsided [name] pouch w/o leaks. Continue tpn and wound care

## 2022-11-23 NOTE — Progress Notes (Addendum)
PHARMACY - TOTAL PARENTERAL NUTRITION CONSULT NOTE   Indication: Prolonged ileus  Patient Measurements: Height: 4\' 11"  (149.9 cm) Weight: 97.7 kg (215 lb 6.2 oz) IBW/kg (Calculated) : 43.2 TPN AdjBW (KG): 55.2 Body mass index is 43.5 kg/m.  Assessment: Debra [name] is a 57 y.o. female s/p laparotomy, excision of greater omental mass, abdominal wall reconstruction with [name], appendectomy, and placement of Prevena vac.  Glucose / Insulin:  --No apparent history of diabetes --BG controlled on current regimen --rSSI 4x/day (0500, 1300, 1800, 2200) --SSI last 24h: 8u  --BG/24h: 112-151 --10 units insulin in TPN Electrolytes: Within normal limits Renal: SCr stable at baseline  Hepatic: Liver function overall within normal limits GI Imaging: 9/11 CTAP: no new acute issues GI Surgeries / Procedures: s/p laparotomy, excision of greater omental mass, abdominal wall reconstruction with [name], appendectomy, and placement of Prevena vac  Central access: 02/15/22 TPN start date: 02/15/22  Nutritional Goals: Goal cyclic TPN over 16 hrs: cyclic TPN over 16 hrs: (provides 102 g of protein and 1811 kcals per day) total volume over 16hrs for calculations=1400 ml (1500 ml total with overfill)  RD Assessment:  Estimated Needs Total Energy Estimated Needs: 1800-2100kcal/day Total Protein Estimated Needs: 90-110g/d Total Fluid Estimated Needs: 1.4-1.6L/day  Current Nutrition:  Soft diet + nutritional supplements, not meeting PO needs yet   Plan: Transitioned to *Cyclic* TPN on [id number].   to run over 16 hours per MD request. Debra [name] at 2000 per discussion with dietician to allow patient time for walking in afternoon/evenings.  To run over 16 hours: -Start rate at 49 mL/hr for 1 hour. -Increase rate to 98 mL/hr for 13 hours.  -Decrease rate to 49 mL/hr for 1 hour. -Decrease rate to 25 mL/hr for 1 hour, then stop.  Plan:  See cyclic TPN rate  above Continue Nutritional Components Amino acids (using 15% Clinisol): 101 g Dextrose 19% = 266 g Lipids (using 20% SMOFlipids): 50.4 g kCal: 1817/24h  Electrolytes in TPN: Na 53mEq/L, K 36mEq/L, Ca 1mEq/L, Mg 10 mEq/L, Phos 10 mmol/L  Continue ratio Cl:Ac 1:1  Continue trace elements, MVI, chromium, zinc and selenium per dietary recs Continue CBG/SSI for Cyclic TPN:  -CBG 2 hrs after cyclic TPN start -CBG during middle of cyclic TPN infusion -CBG 1 hr after cylic TPN stopped -CBG while off TPN Continue SSI insulin at moderate scale and continue TPN with 10 units of insulin Check TPN labs on Mondays/once weekly ISO stable labs  Debra [name], PharmD Clinical Pharmacist 11/23/2022 10:59 AM

## 2022-11-24 LAB — MAGNESIUM: Magnesium: 1.9 mg/dL (ref 1.7–2.4)

## 2022-11-24 LAB — CBC
HCT: 23 % — ABNORMAL LOW (ref 36.0–46.0)
Hemoglobin: 7.2 g/dL — ABNORMAL LOW (ref 12.0–15.0)
MCH: 28.9 pg (ref 26.0–34.0)
MCHC: 31.3 g/dL (ref 30.0–36.0)
MCV: 92.4 fL (ref 80.0–100.0)
Platelets: 104 10*3/uL — ABNORMAL LOW (ref 150–400)
RBC: 2.49 MIL/uL — ABNORMAL LOW (ref 3.87–5.11)
RDW: 17 % — ABNORMAL HIGH (ref 11.5–15.5)
WBC: 3.4 10*3/uL — ABNORMAL LOW (ref 4.0–10.5)
nRBC: 0 % (ref 0.0–0.2)

## 2022-11-24 LAB — COMPREHENSIVE METABOLIC PANEL
ALT: 25 U/L (ref 0–44)
AST: 32 U/L (ref 15–41)
Albumin: 2.5 g/dL — ABNORMAL LOW (ref 3.5–5.0)
Alkaline Phosphatase: 118 U/L (ref 38–126)
Anion gap: 5 (ref 5–15)
BUN: 24 mg/dL — ABNORMAL HIGH (ref 6–20)
CO2: 24 mmol/L (ref 22–32)
Calcium: 7.7 mg/dL — ABNORMAL LOW (ref 8.9–10.3)
Chloride: 103 mmol/L (ref 98–111)
Creatinine, Ser: 0.44 mg/dL (ref 0.44–1.00)
GFR, Estimated: >60 mL/min (ref >60)
Glucose, Bld: 175 mg/dL — ABNORMAL HIGH (ref 70–99)
Potassium: 4.4 mmol/L (ref 3.5–5.1)
Sodium: 132 mmol/L — ABNORMAL LOW (ref 135–145)
Total Bilirubin: 0.4 mg/dL (ref 0.3–1.2)
Total Protein: 6.1 g/dL — ABNORMAL LOW (ref 6.5–8.1)

## 2022-11-24 LAB — GLUCOSE, CAPILLARY
Glucose-Capillary: 144 mg/dL — ABNORMAL HIGH (ref 70–99)
Glucose-Capillary: 201 mg/dL — ABNORMAL HIGH (ref 70–99)
Glucose-Capillary: 189 mg/dL — ABNORMAL HIGH (ref 70–99)
Glucose-Capillary: 166 mg/dL — ABNORMAL HIGH (ref 70–99)
Glucose-Capillary: 169 mg/dL — ABNORMAL HIGH (ref 70–99)
Glucose-Capillary: 159 mg/dL — ABNORMAL HIGH (ref 70–99)
Glucose-Capillary: 108 mg/dL — ABNORMAL HIGH (ref 70–99)
Glucose-Capillary: 188 mg/dL — ABNORMAL HIGH (ref 70–99)
Glucose-Capillary: 133 mg/dL — ABNORMAL HIGH (ref 70–99)
Glucose-Capillary: 106 mg/dL — ABNORMAL HIGH (ref 70–99)
Glucose-Capillary: 132 mg/dL — ABNORMAL HIGH (ref 70–99)

## 2022-11-24 LAB — PREALBUMIN: Prealbumin: 12 mg/dL — ABNORMAL LOW (ref 18–38)

## 2022-11-24 LAB — PROTIME-INR
INR: 1.4 — ABNORMAL HIGH (ref 0.8–1.2)
Prothrombin Time: 16.8 seconds — ABNORMAL HIGH (ref 11.4–15.2)

## 2022-11-24 LAB — PREPARE RBC (CROSSMATCH)

## 2022-11-24 LAB — PHOSPHORUS: Phosphorus: 3.3 mg/dL (ref 2.5–4.6)

## 2022-11-24 MED ORDER — ZINC CHLORIDE 1 MG/ML IV SOLN
INTRAVENOUS | Status: AC
  Filled 2022-11-24: qty 672

## 2022-11-24 MED ORDER — SODIUM CHLORIDE 0.9% IV SOLUTION: Freq: Once | INTRAVENOUS | Status: AC

## 2022-11-24 NOTE — Progress Notes (Signed)
Seen and examined.  Doing well.  Hemoglobin in the 7 .2. W some symptoms Abdomen soft some bilious drainage.  No peritonitis.  No active bleeding  A/p transfuse 1 unit rbc Mobilize [place] tpn and wound care

## 2022-11-24 NOTE — Progress Notes (Signed)
PHARMACY - TOTAL PARENTERAL NUTRITION CONSULT NOTE   Indication: Prolonged ileus  Patient Measurements: Height: 4\' 11"  (149.9 cm) Weight: 97.7 kg (215 lb 6.2 oz) IBW/kg (Calculated) : 43.2 TPN AdjBW (KG): 55.2 Body mass index is 43.5 kg/m.  Assessment: Debra [name] is a 57 y.o. female s/p laparotomy, excision of greater omental mass, abdominal wall reconstruction with [name], appendectomy, and placement of Prevena vac.  Glucose / Insulin:  --No apparent history of diabetes --BG controlled on current regimen --rSSI 4x/day (0500, 1300, 1800, 2200) --SSI last 24h: 8u  --BG/24h: 108-188 --10 units insulin in TPN Electrolytes: Within normal limits Renal: SCr stable at baseline  Hepatic: Liver function overall within normal limits GI Imaging: 9/11 CTAP: no new acute issues GI Surgeries / Procedures: s/p laparotomy, excision of greater omental mass, abdominal wall reconstruction with [name], appendectomy, and placement of Prevena vac  Central access: 02/15/22 TPN start date: 02/15/22  Nutritional Goals: Goal cyclic TPN over 16 hrs: cyclic TPN over 16 hrs: (provides 102 g of protein and 1811 kcals per day) total volume over 16hrs for calculations=1400 ml (1500 ml total with overfill)  RD Assessment:  Estimated Needs Total Energy Estimated Needs: 1800-2100kcal/day Total Protein Estimated Needs: 90-110g/d Total Fluid Estimated Needs: 1.4-1.6L/day  Current Nutrition:  Soft diet + nutritional supplements, not meeting PO needs yet   Plan: Transitioned to *Cyclic* TPN on [id number].   to run over 16 hours per MD request. [name] at 2000 per discussion with dietician to allow patient time for walking in afternoon/evenings.  To run over 16 hours: -Start rate at 49 mL/hr for 1 hour. -Increase rate to 98 mL/hr for 13 hours.  -Decrease rate to 49 mL/hr for 1 hour. -Decrease rate to 25 mL/hr for 1 hour, then stop.  Plan:  See cyclic TPN rate  above Continue Nutritional Components Amino acids (using 15% Clinisol): 101 g Dextrose 19% = 266 g Lipids (using 20% SMOFlipids): 50.4 g kCal: 1817/24h  Electrolytes in TPN: Na 55mEq/L, K 82mEq/L, Ca 37mEq/L, Mg 10 mEq/L, Phos 10 mmol/L  Continue ratio Cl:Ac 1:1  Continue trace elements, MVI, chromium, zinc and selenium per dietary recs Continue CBG/SSI for Cyclic TPN:  -CBG 2 hrs after cyclic TPN start -CBG during middle of cyclic TPN infusion -CBG 1 hr after cylic TPN stopped -CBG while off TPN Continue SSI insulin at moderate scale and increase insulin in TPN from 10 units to 12 units Check TPN labs on Mondays/once weekly ISO stable labs  Lorin Picket, PharmD Clinical Pharmacist 11/24/2022 7:31 AM

## 2022-11-25 LAB — MISC LABCORP TEST (SEND OUT): Labcorp test code: 716910

## 2022-11-25 LAB — TYPE AND SCREEN
ABO/RH(D): O POS
Antibody Screen: NEGATIVE
Unit division: 0

## 2022-11-25 LAB — BPAM RBC
Blood Product Expiration Date: 202404142359
ISSUE DATE / TIME: 202403182136
Unit Type and Rh: 5100

## 2022-11-25 LAB — GLUCOSE, CAPILLARY
Glucose-Capillary: 153 mg/dL — ABNORMAL HIGH (ref 70–99)
Glucose-Capillary: 104 mg/dL — ABNORMAL HIGH (ref 70–99)
Glucose-Capillary: 117 mg/dL — ABNORMAL HIGH (ref 70–99)

## 2022-11-25 LAB — ZINC: Zinc: 66 ug/dL (ref 44–115)

## 2022-11-25 LAB — HEMOGLOBIN AND HEMATOCRIT, BLOOD
HCT: 27.9 % — ABNORMAL LOW (ref 36.0–46.0)
Hemoglobin: 8.6 g/dL — ABNORMAL LOW (ref 12.0–15.0)

## 2022-11-25 MED ORDER — ZINC CHLORIDE 1 MG/ML IV SOLN
INTRAVENOUS | Status: AC
  Filled 2022-11-25: qty 672

## 2022-11-25 NOTE — Progress Notes (Signed)
PHARMACY - TOTAL PARENTERAL NUTRITION CONSULT NOTE   Indication: Prolonged ileus  Patient Measurements: Height: 4\' 11"  (149.9 cm) Weight: 98.2 kg (216 lb 7.9 oz) IBW/kg (Calculated) : 43.2 TPN AdjBW (KG): 55.2 Body mass index is 43.73 kg/m.  Assessment: Debra [name] is a 57 y.o. female s/p laparotomy, excision of greater omental mass, abdominal wall reconstruction with [name], appendectomy, and placement of Prevena vac.  Glucose / Insulin:  --No apparent history of diabetes --BG controlled on current regimen --rSSI 4x/day (0500, 1300, 1800, 2200) --SSI last 24h: 4u  --BG/24h: 106-153 --12 units insulin in TPN Electrolytes: Within normal limits Renal: SCr stable at baseline  Hepatic: Liver function overall within normal limits GI Imaging: 9/11 CTAP: no new acute issues GI Surgeries / Procedures: s/p laparotomy, excision of greater omental mass, abdominal wall reconstruction with [name], appendectomy, and placement of Prevena vac  Central access: 02/15/22 TPN start date: 02/15/22  Nutritional Goals: Goal cyclic TPN over 16 hrs: cyclic TPN over 16 hrs: (provides 102 g of protein and 1811 kcals per day) total volume over 16hrs for calculations=1400 ml (1500 ml total with overfill)  RD Assessment:  Estimated Needs Total Energy Estimated Needs: 1800-2100kcal/day Total Protein Estimated Needs: 90-110g/d Total Fluid Estimated Needs: 1.4-1.6L/day  Current Nutrition:  Soft diet + nutritional supplements, not meeting PO needs yet   Plan: Transitioned to *Cyclic* TPN on [id number].   to run over 16 hours per MD request. [name] at 2000 per discussion with dietician to allow patient time for walking in afternoon/evenings.  To run over 16 hours: -Start rate at 49 mL/hr for 1 hour. -Increase rate to 98 mL/hr for 13 hours.  -Decrease rate to 49 mL/hr for 1 hour. -Decrease rate to 25 mL/hr for 1 hour, then stop.  Plan:  See cyclic TPN rate  above Continue Nutritional Components Amino acids (using 15% Clinisol): 101 g Dextrose 19% = 266 g Lipids (using 20% SMOFlipids): 50.4 g kCal: 1817/24h  Electrolytes in TPN: Na 70 mEq/L, K 75mEq/L, Ca 39mEq/L, Mg 10 mEq/L, Phos 10 mmol/L  Continue ratio Cl:Ac 1:1  Continue trace elements, MVI, chromium, zinc and selenium per dietary recs Continue CBG/SSI for Cyclic TPN:  -CBG 2 hrs after cyclic TPN start -CBG during middle of cyclic TPN infusion -CBG 1 hr after cylic TPN stopped -CBG while off TPN Continue SSI insulin at moderate scale and increase insulin in TPN from 10 units to 12 units on 3/18 Check TPN labs on Mondays/once weekly ISO stable labs  Lorin Picket, PharmD Clinical Pharmacist 11/25/2022 7:49 AM

## 2022-11-25 NOTE — Progress Notes (Signed)
[facility] SURGICAL PROGRESS NOTE (cpt [phone])  Hospital Day(s): 285.   Post op day(s): 283 Days Post-Op.   Interval History:  Patient seen and examined Patient had 1 unit pRBC transfused yesterday.  Denies any pain today.  Hgb responded well to transfusion.  Has been ambulating.  No leaks from [name] pouch.  Review of Systems:  Constitutional: denies fever, chills  HEENT: denies cough or congestion  Respiratory: denies any shortness of breath  Cardiovascular: denies chest pain or palpitations  Gastrointestinal: denied abdominal pain, denies N/V Genitourinary: denies burning with urination or urinary frequency Integumentary: + midline wound (stable)   Vital signs in last 24 hours: [min-max] current  Temp:  [97.7 F (36.5 C)-98 F (36.7 C)] 98 F (36.7 C) (03/19 1704) Pulse Rate:  [69-79] 76 (03/19 1704) Resp:  [18] 18 (03/19 1704) BP: (98-109)/(54-74) 105/74 (03/19 1704) SpO2:  [97 %-100 %] 100 % (03/19 1704) Weight:  [216 lb 7.9 oz (98.2 kg)] 216 lb 7.9 oz (98.2 kg) (03/19 0600)     Height: 4\' 11"  (149.9 cm) Weight: 216 lb 7.9 oz (98.2 kg) BMI (Calculated): 43.04   Intake/Output last 2 shifts:  03/18 0701 - 03/19 0700 In: 1368.8 [P.O.:240; I.V.:663.3; Blood:465.4] Out: 850 [Drains:800; Stool:50]   Physical Exam:  Constitutional: alert, cooperative and no distress  Respiratory: breathing non-labored at rest  Cardiovascular: regular rate and sinus rhythm  Gastrointestinal: Soft, non-tender, non-distended, no rebound/guarding. Integumentary: Midline wound open, peritoneum closed; granulating; there are three stomatized areas visible in the LUQ and LLQ portions of the wound. No active bleeding.   Labs:     Latest Ref Rng & Units 11/25/2022    9:00 AM 11/24/2022    4:39 AM 11/22/2022    9:09 AM  CBC  WBC 4.0 - 10.5 K/uL  3.4  3.2   Hemoglobin 12.0 - 15.0 g/dL 8.6  7.2  8.0   Hematocrit 36.0 - 46.0 % 27.9  23.0  25.6   Platelets 150 - 400 K/uL  104  118        Latest Ref Rng & Units 11/24/2022    4:39 AM 11/20/2022   10:07 AM 11/18/2022    4:50 AM  CMP  Glucose 70 - 99 mg/dL 175  124  139   BUN 6 - 20 mg/dL 24  22  21    Creatinine 0.44 - 1.00 mg/dL 0.44  0.46  0.42   Sodium 135 - 145 mmol/L 132  134  132   Potassium 3.5 - 5.1 mmol/L 4.4  4.5  4.0   Chloride 98 - 111 mmol/L 103  105  104   CO2 22 - 32 mmol/L 24  24  24    Calcium 8.9 - 10.3 mg/dL 7.7  8.3  8.3   Total Protein 6.5 - 8.1 g/dL 6.1   6.1   Total Bilirubin 0.3 - 1.2 mg/dL 0.4   0.4   Alkaline Phos 38 - 126 U/L 118   136   AST 15 - 41 U/L 32   37   ALT 0 - 44 U/L 25   33     Imaging studies: No new pertinent imaging studies this AM   Assessment/Plan:  57 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   - New 03/19: No new  issues, responded well to pRBC transfusion.  - Wound Care ([name] Pouch); Appreciate WOC RN assistance. No longer on intermittent or continuous suction. Patient seems to like this better and has started to manage effluent independently. She will need intermittent suction with Yankauer to control effluent. Likely be beneficial to suction before ambulation.    - Continue soft diet as tolerated  - Continue cyclic TPN; bi-weekly nutritional labs - Appreciate dietary assistance             - Monitor abdominal examination; on-going bowel function            - Pain control prn; antemetic prn            - Progressed with therapies; no longer any recommendations; ambulating well    - Appreciate neurosurgery assistance with compression fracture; serial XRs   - Discharge Planning: Continue to anticipate lengthy admission +/- possible transfer; no new progress regarding disposition unfortunately   I  spent 35 minutes dedicated to the care of this patient on the date of this encounter to include pre-visit review of records, face-to-face time with the patient discussing diagnosis and management, and any post-visit coordination of care.   Olean Ree, MD [facility]

## 2022-11-25 NOTE — Progress Notes (Signed)
Nutrition Follow-up  DOCUMENTATION CODES:   Obesity unspecified  INTERVENTION:   Continue cyclic TPN per pharmacy (16 hrs)- provides 1811kcal/day and 102g/day protein   Ensure Enlive po BID, each supplement provides 350 kcal and 20 grams of protein.  MVI, chromium and trace elements in TPN   Triglycerides checked monthly   Daily weights   Vitamin D2- 50,000 units po weekly   Zinc additional 5mg  daily added to TPN   Continue Selenium additional 51mcg daily added to TPN   Zinc lab pending   Routine vitamin labs due 4/9- will include carnitine and aluminum.   NUTRITION DIAGNOSIS:   Increased nutrient needs related to wound healing, catabolic illness as evidenced by estimated needs. -ongoing   GOAL:   Patient will meet greater than or equal to 90% of their needs -met with TPN   MONITOR:   PO intake, Supplement acceptance, Labs, Weight trends, Diet advancement, I & O's, TPN  ASSESSMENT:   57 y/o female with h/o hypothyroidism, COVID 19 (3/21), kidney stones and stage 3 colon cancer (s/p left hemicolectomy 5/21 and chemotherapy) who is admitted for new pelvic mass now s/p laparotomy 6/8 (with excision of pelvic mass from greater omentum, abdominal wall reconstruction with bilateral myocutaneous flaps and mesh, incisional hernia repair, appendectomy repair and VAC placement) complicated by bowel perforation s/p reopening of recent laparotomy 6/10 (with repair of small bowel perforation, excision of mesh, placement of two phasix mesh and VAC placement). Pathology returned as metastatic adenocarcinoma. Pt with L1 compression fracture.   Pt continues to tolerate TPN well at goal rate; TPN is currently being cycled for 16 hrs. Triglycerides wnl and are being checked monthly. Hyperglycemia improved with insulin in TPN. Volume status being monitored; pt up ~19lbs since admission. Will check routine vitamin labs every 2-3 months as pt on chronic TPN (next due 4/9). Pt continues to  have poor oral intake; pt eating <25% of meals. Pt on soft diet. [name] pouch with 819ml output. No discharge plan in place.   Medications reviewed and include: D2, insulin, synthroid, protonix, TPN  -Selenium 92(L), zinc (pend)- 3/15 -Chromium- 2.4(H), copper- 105 wnl, Manganese- 13.1 wnl, vitamin D 34.2 wnl, Iodine 74.2 wnl- 2/8 -Iron 23(L), TIBC 364, ferritin 17 wnl, transferrin 261 wnl, folate 14.5 wnl, B12 585- 2/8 -Vitamin B1- 75.8, vitamin A- 27.1, B6- 21.9 wnl, vitamin C- 0.7, vitamin E- 11.6 wnl, vitamin K- 1.03 wnl- 2/8  Labs reviewed: Na 132(L), K 4.4 wnl, BUN 24(H), P 3.3 wnl, Mg 1.9 wnl- 3/18 Prealbumin- 12(L)- 3/18 Triglycerides- 129- 3/4 Hgb 8.6(L), Hct 27.9(L) Cbgs- 104, 153 x 24 hrs  Diet Order:    Diet Order             DIET SOFT Room service appropriate? Yes; Fluid consistency: Thin  Diet effective now                  EDUCATION NEEDS:   Not appropriate for education at this time  Skin:  Skin Assessment: Reviewed RN Assessment ( Midline wound: 11cm x 12cm )  Last BM:  3/18- TYPE 7  Height:   Ht Readings from Last 1 Encounters:  03/01/22 4\' 11"  (1.499 m)    Weight:   Wt Readings from Last 1 Encounters:  11/25/22 98.2 kg    Ideal Body Weight:  44.3 kg  BMI:  Body mass index is 43.73 kg/m.  Estimated Nutritional Needs:   Kcal:  1800-2100kcal/day  Protein:  90-110g/d  Fluid:  1.4-1.6L/day  Koleen Distance  MS, RD, LDN Please refer to [facility] for RD and/or RD on-call/weekend/after hours pager

## 2022-11-26 LAB — GLUCOSE, CAPILLARY
Glucose-Capillary: 138 mg/dL — ABNORMAL HIGH (ref 70–99)
Glucose-Capillary: 139 mg/dL — ABNORMAL HIGH (ref 70–99)
Glucose-Capillary: 134 mg/dL — ABNORMAL HIGH (ref 70–99)
Glucose-Capillary: 111 mg/dL — ABNORMAL HIGH (ref 70–99)
Glucose-Capillary: 84 mg/dL (ref 70–99)

## 2022-11-26 MED ORDER — ZINC CHLORIDE 1 MG/ML IV SOLN
INTRAVENOUS | Status: AC
  Filled 2022-11-26: qty 672

## 2022-11-26 NOTE — Progress Notes (Signed)
PHARMACY - TOTAL PARENTERAL NUTRITION CONSULT NOTE   Indication: Prolonged ileus  Patient Measurements: Height: 4\' 11"  (149.9 cm) Weight: 98.2 kg (216 lb 7.9 oz) IBW/kg (Calculated) : 43.2 TPN AdjBW (KG): 55.2 Body mass index is 43.73 kg/m.  Assessment: Debra [name] is a 57 y.o. female s/p laparotomy, excision of greater omental mass, abdominal wall reconstruction with [name], appendectomy, and placement of Prevena vac.  Glucose / Insulin:  --No apparent history of diabetes --BG controlled on current regimen --rSSI 4x/day (0500, 1300, 1800, 2200) --SSI last 24h: 4u  --BG/24h: 104-139 --12 units insulin in TPN Electrolytes: Within normal limits Renal: SCr stable at baseline  Hepatic: Liver function overall within normal limits GI Imaging: 9/11 CTAP: no new acute issues GI Surgeries / Procedures: s/p laparotomy, excision of greater omental mass, abdominal wall reconstruction with [name], appendectomy, and placement of Prevena vac  Central access: 02/15/22 TPN start date: 02/15/22  Nutritional Goals: Goal cyclic TPN over 16 hrs: cyclic TPN over 16 hrs: (provides 102 g of protein and 1811 kcals per day) total volume over 16hrs for calculations=1400 ml (1500 ml total with overfill)  RD Assessment:  Estimated Needs Total Energy Estimated Needs: 1800-2100kcal/day Total Protein Estimated Needs: 90-110g/d Total Fluid Estimated Needs: 1.4-1.6L/day  Current Nutrition:  Soft diet + nutritional supplements, not meeting PO needs yet   Plan: Transitioned to *Cyclic* TPN on [id number].   to run over 16 hours per MD request. [name] at 2000 per discussion with dietician to allow patient time for walking in afternoon/evenings.  To run over 16 hours: -Start rate at 49 mL/hr for 1 hour. -Increase rate to 98 mL/hr for 13 hours.  -Decrease rate to 49 mL/hr for 1 hour. -Decrease rate to 25 mL/hr for 1 hour, then stop.  Plan:  See cyclic TPN rate  above Continue Nutritional Components Amino acids (using 15% Clinisol): 101 g Dextrose 19% = 266 g Lipids (using 20% SMOFlipids): 50.4 g kCal: 1817/24h  Electrolytes in TPN: Na 70 mEq/L, K 60mEq/L, Ca 33mEq/L, Mg 10 mEq/L, Phos 10 mmol/L  Continue ratio Cl:Ac 1:1  Continue trace elements, MVI, chromium, zinc and selenium per dietary recs Continue CBG/SSI for Cyclic TPN:  -CBG 2 hrs after cyclic TPN start -CBG during middle of cyclic TPN infusion -CBG 1 hr after cylic TPN stopped -CBG while off TPN Continue SSI insulin at moderate scale and 12 units of insulin in TPN Check TPN labs on Mondays/once weekly ISO stable labs  Lorin Picket, PharmD Clinical Pharmacist 11/26/2022 6:48 AM

## 2022-11-26 NOTE — Progress Notes (Signed)
Seen and examined. No new issues, good response to transfusion No more leak Continue current care

## 2022-11-27 LAB — GLUCOSE, CAPILLARY
Glucose-Capillary: 161 mg/dL — ABNORMAL HIGH (ref 70–99)
Glucose-Capillary: 106 mg/dL — ABNORMAL HIGH (ref 70–99)
Glucose-Capillary: 127 mg/dL — ABNORMAL HIGH (ref 70–99)
Glucose-Capillary: 103 mg/dL — ABNORMAL HIGH (ref 70–99)

## 2022-11-27 MED ORDER — ZINC CHLORIDE 1 MG/ML IV SOLN
INTRAVENOUS | Status: AC
  Filled 2022-11-27: qty 672

## 2022-11-27 NOTE — Progress Notes (Signed)
Pt seen and examined In good spirits. Boyfriend updated Continue eakin and TPN

## 2022-11-27 NOTE — Progress Notes (Signed)
PHARMACY - TOTAL PARENTERAL NUTRITION CONSULT NOTE   Indication: Prolonged ileus  Patient Measurements: Height: 4\' 11"  (149.9 cm) Weight: 95 kg (209 lb 7 oz) IBW/kg (Calculated) : 43.2 TPN AdjBW (KG): 55.2 Body mass index is 42.3 kg/m.  Assessment: Debra [name] is a 57 y.o. female s/p laparotomy, excision of greater omental mass, abdominal wall reconstruction with [name], appendectomy, and placement of Prevena vac.  Glucose / Insulin:  --No apparent history of diabetes --BG controlled on current regimen --rSSI 4x/day (0500, 1300, 1800, 2200) --SSI last 24h: 5u  --BG/24h: 84-161 --12 units insulin in TPN Electrolytes: Within normal limits Renal: SCr stable at baseline  Hepatic: Liver function overall within normal limits GI Imaging: 9/11 CTAP: no new acute issues GI Surgeries / Procedures: s/p laparotomy, excision of greater omental mass, abdominal wall reconstruction with [name], appendectomy, and placement of Prevena vac  Central access: 02/15/22 TPN start date: 02/15/22  Nutritional Goals: Goal cyclic TPN over 16 hrs: cyclic TPN over 16 hrs: (provides 102 g of protein and 1811 kcals per day) total volume over 16hrs for calculations=1400 ml (1500 ml total with overfill)  RD Assessment:  Estimated Needs Total Energy Estimated Needs: 1800-2100kcal/day Total Protein Estimated Needs: 90-110g/d Total Fluid Estimated Needs: 1.4-1.6L/day  Current Nutrition:  Soft diet + nutritional supplements, not meeting PO needs yet   Plan: Transitioned to *Cyclic* TPN on [id number].   to run over 16 hours per MD request. [name] at 2000 per discussion with dietician to allow patient time for walking in afternoon/evenings.  To run over 16 hours: -Start rate at 49 mL/hr for 1 hour. -Increase rate to 98 mL/hr for 13 hours.  -Decrease rate to 49 mL/hr for 1 hour. -Decrease rate to 25 mL/hr for 1 hour, then stop.  Plan:  See cyclic TPN rate above Continue  Nutritional Components Amino acids (using 15% Clinisol): 101 g Dextrose 19% = 266 g Lipids (using 20% SMOFlipids): 50.4 g kCal: 1817/24h  Electrolytes in TPN: Na 70 mEq/L, K 75mEq/L, Ca 37mEq/L, Mg 10 mEq/L, Phos 10 mmol/L  Continue ratio Cl:Ac 1:1  Continue trace elements, MVI, chromium, zinc and selenium per dietary recs Continue CBG/SSI for Cyclic TPN:  -CBG 2 hrs after cyclic TPN start -CBG during middle of cyclic TPN infusion -CBG 1 hr after cylic TPN stopped -CBG while off TPN Continue SSI insulin at moderate scale and 12 units of insulin in TPN Check TPN labs on Mondays/once weekly ISO stable labs  Lorin Picket, PharmD Clinical Pharmacist 11/27/2022 6:57 AM

## 2022-11-28 LAB — GLUCOSE, CAPILLARY
Glucose-Capillary: 132 mg/dL — ABNORMAL HIGH (ref 70–99)
Glucose-Capillary: 118 mg/dL — ABNORMAL HIGH (ref 70–99)
Glucose-Capillary: 91 mg/dL (ref 70–99)
Glucose-Capillary: 115 mg/dL — ABNORMAL HIGH (ref 70–99)

## 2022-11-28 MED ORDER — ZINC CHLORIDE 1 MG/ML IV SOLN
INTRAVENOUS | Status: AC
  Filled 2022-11-28: qty 672

## 2022-11-28 NOTE — Progress Notes (Signed)
PHARMACY - TOTAL PARENTERAL NUTRITION CONSULT NOTE   Indication: Prolonged ileus  Patient Measurements: Height: 4\' 11"  (149.9 cm) Weight: 94.9 kg (209 lb 3.5 oz) IBW/kg (Calculated) : 43.2 TPN AdjBW (KG): 55.2 Body mass index is 42.26 kg/m.  Assessment: Debra [name] is a 57 y.o. female s/p laparotomy, excision of greater omental mass, abdominal wall reconstruction with [name], appendectomy, and placement of Prevena vac.  Glucose / Insulin:  --No apparent history of diabetes --BG controlled on current regimen --rSSI 4x/day (0500, 1300, 1800, 2200) --SSI last 24h: 4u  --BG/24h: 103-132 --12 units insulin in TPN Electrolytes: Within normal limits Renal: SCr stable at baseline  Hepatic: Liver function overall within normal limits GI Imaging: 9/11 CTAP: no new acute issues GI Surgeries / Procedures: s/p laparotomy, excision of greater omental mass, abdominal wall reconstruction with [name], appendectomy, and placement of Prevena vac  Central access: 02/15/22 TPN start date: 02/15/22  Nutritional Goals: Goal cyclic TPN over 16 hrs: cyclic TPN over 16 hrs: (provides 102 g of protein and 1811 kcals per day) total volume over 16hrs for calculations=1400 ml (1500 ml total with overfill)  RD Assessment:  Estimated Needs Total Energy Estimated Needs: 1800-2100kcal/day Total Protein Estimated Needs: 90-110g/d Total Fluid Estimated Needs: 1.4-1.6L/day  Current Nutrition:  Soft diet + nutritional supplements, not meeting PO needs yet   Plan: Transitioned to *Cyclic* TPN on [id number].   to run over 16 hours per MD request. [name] at 2000 per discussion with dietician to allow patient time for walking in afternoon/evenings.  To run over 16 hours: -Start rate at 49 mL/hr for 1 hour. -Increase rate to 98 mL/hr for 13 hours.  -Decrease rate to 49 mL/hr for 1 hour. -Decrease rate to 25 mL/hr for 1 hour, then stop.  Plan:  See cyclic TPN rate  above Continue Nutritional Components Amino acids (using 15% Clinisol): 101 g Dextrose 19% = 266 g Lipids (using 20% SMOFlipids): 50.4 g kCal: 1817/24h  Electrolytes in TPN: Na 70 mEq/L, K 36mEq/L, Ca 81mEq/L, Mg 10 mEq/L, Phos 10 mmol/L  Continue ratio Cl:Ac 1:1  Continue trace elements, MVI, chromium, zinc and selenium per dietary recs Continue CBG/SSI for Cyclic TPN:  -CBG 2 hrs after cyclic TPN start -CBG during middle of cyclic TPN infusion -CBG 1 hr after cylic TPN stopped -CBG while off TPN Continue SSI insulin at moderate scale and 12 units of insulin in TPN Check TPN labs on Mondays/once weekly ISO stable labs  Lorin Picket, PharmD Clinical Pharmacist 11/28/2022 8:31 AM

## 2022-11-28 NOTE — Progress Notes (Signed)
CC: EC fistula Subjective: No complaints   Objective: Vital signs in last 24 hours: Temp:  [98.2 F (36.8 C)-98.9 F (37.2 C)] 98.2 F (36.8 C) (03/22 0918) Pulse Rate:  [66-74] 66 (03/22 0918) Resp:  [16-18] 18 (03/22 0918) BP: (101-110)/(53-74) 101/53 (03/22 0918) SpO2:  [98 %-100 %] 99 % (03/22 0918) Weight:  [94.9 kg] 94.9 kg (03/22 0500) Last BM Date : 11/27/22  Intake/Output from previous day: 03/21 0701 - 03/22 0700 In: 797.5 [I.V.:797.5] Out: -  Intake/Output this shift: No intake/output data recorded.  Physical exam:  NAD alert Abd: soft, eakin in place, no bleeding or leaks  Lab Results: CBC  No results for input(s): "WBC", "HGB", "HCT", "PLT" in the last 72 hours. BMET No results for input(s): "NA", "K", "CL", "CO2", "GLUCOSE", "BUN", "CREATININE", "CALCIUM" in the last 72 hours. PT/INR No results for input(s): "LABPROT", "INR" in the last 72 hours. ABG No results for input(s): "PHART", "HCO3" in the last 72 hours.  Invalid input(s): "PCO2", "PO2"  Studies/Results: No results found.  Anti-infectives: Anti-infectives (From admission, onward)    Start     Dose/Rate Route Frequency Ordered Stop   09/17/22 1200  vancomycin (VANCOREADY) IVPB 1250 mg/250 mL  Status:  Discontinued        1,250 mg 166.7 mL/hr over 90 Minutes Intravenous Every 24 hours 09/16/22 1040 09/22/22 1139   09/16/22 1200  vancomycin (VANCOREADY) IVPB 1250 mg/250 mL        1,250 mg 166.7 mL/hr over 90 Minutes Intravenous  Once 09/15/22 1706 09/16/22 1255   09/15/22 1200  ceFEPIme (MAXIPIME) 2 g in sodium chloride 0.9 % 100 mL IVPB  Status:  Discontinued        2 g 200 mL/hr over 30 Minutes Intravenous Every 8 hours 09/15/22 1024 09/18/22 1026   09/15/22 1200  vancomycin (VANCOREADY) IVPB 2000 mg/400 mL        2,000 mg 200 mL/hr over 120 Minutes Intravenous  Once 09/15/22 1024 09/15/22 1500   03/04/22 2200  piperacillin-tazobactam (ZOSYN) IVPB 3.375 g        3.375 g 12.5 mL/hr  over 240 Minutes Intravenous Every 8 hours 03/04/22 2005 03/18/22 0248   02/21/22 1500  vancomycin (VANCOREADY) IVPB 1250 mg/250 mL  Status:  Discontinued        1,250 mg 166.7 mL/hr over 90 Minutes Intravenous Every 24 hours 02/21/22 1111 02/24/22 1336   02/21/22 1400  meropenem (MERREM) 1 g in sodium chloride 0.9 % 100 mL IVPB        1 g 200 mL/hr over 30 Minutes Intravenous Every 8 hours 02/21/22 1258 02/27/22 2157   02/20/22 1500  vancomycin (VANCOCIN) IVPB 1000 mg/200 mL premix  Status:  Discontinued        1,000 mg 200 mL/hr over 60 Minutes Intravenous Every 24 hours 02/19/22 1706 02/21/22 1111   02/19/22 1115  vancomycin (VANCOREADY) IVPB 2000 mg/400 mL        2,000 mg 200 mL/hr over 120 Minutes Intravenous  Once 02/19/22 1025 02/19/22 1732   02/19/22 1115  fluconazole (DIFLUCAN) IVPB 400 mg        400 mg 100 mL/hr over 120 Minutes Intravenous Every 24 hours 02/19/22 1025 03/04/22 1301   02/15/22 0730  piperacillin-tazobactam (ZOSYN) IVPB 3.375 g  Status:  Discontinued        3.375 g 12.5 mL/hr over 240 Minutes Intravenous Every 8 hours 02/15/22 0639 02/21/22 1235   02/15/22 0655  piperacillin-tazobactam (ZOSYN) 3.375 GM/50ML IVPB  Note to Pharmacy: Glenford Peers N: cabinet override      02/15/22 0655 02/15/22 0816   02/13/22 2200  cefoTEtan (CEFOTAN) 2 g in sodium chloride 0.9 % 100 mL IVPB  Status:  Discontinued        2 g 200 mL/hr over 30 Minutes Intravenous Every 12 hours 02/13/22 1541 02/15/22 0639   02/13/22 1345  cefoTEtan (CEFOTAN) 2 g in sodium chloride 0.9 % 100 mL IVPB        2 g 200 mL/hr over 30 Minutes Intravenous  Once 02/13/22 1331 02/13/22 2146   02/13/22 1336  sodium chloride 0.9 % with cefoTEtan (CEFOTAN) ADS Med       Note to Pharmacy: Rulon Sera: cabinet override      02/13/22 1336 02/14/22 0144   02/13/22 0620  sodium chloride 0.9 % with cefoTEtan (CEFOTAN) ADS Med       Note to Pharmacy: Norton Blizzard A: cabinet override      02/13/22  0620 02/13/22 0802   02/13/22 0600  cefoTEtan (CEFOTAN) 2 g in sodium chloride 0.9 % 100 mL IVPB        2 g 200 mL/hr over 30 Minutes Intravenous On call to O.R. 02/12/22 2229 02/13/22 2146       Assessment/Plan:  EC fistula no new issues TPn and eakin  Caroleen Hamman, MD, FACS  11/28/2022

## 2022-11-29 MED ORDER — ZINC CHLORIDE 1 MG/ML IV SOLN
INTRAVENOUS | Status: AC
  Filled 2022-11-29: qty 672

## 2022-11-29 NOTE — Progress Notes (Signed)
PHARMACY - TOTAL PARENTERAL NUTRITION CONSULT NOTE   Indication: Prolonged ileus  Patient Measurements: Height: 4\' 11"  (149.9 cm) Weight: 94.9 kg (209 lb 3.5 oz) IBW/kg (Calculated) : 43.2 TPN AdjBW (KG): 55.2 Body mass index is 42.26 kg/m.  Assessment: Debra [name] is a 57 y.o. female s/p laparotomy, excision of greater omental mass, abdominal wall reconstruction with [name], appendectomy, and placement of Prevena vac.  Glucose / Insulin:  --No apparent history of diabetes --BG controlled on current regimen --rSSI 4x/day (0500, 1300, 1800, 2200) --SSI last 24h: 4u  --BG/24h: 103-132 --12 units insulin in TPN Electrolytes: Within normal limits Renal: SCr stable at baseline  Hepatic: Liver function overall within normal limits GI Imaging: 9/11 CTAP: no new acute issues GI Surgeries / Procedures: s/p laparotomy, excision of greater omental mass, abdominal wall reconstruction with [name], appendectomy, and placement of Prevena vac  Central access: 02/15/22 TPN start date: 02/15/22  Nutritional Goals: Goal cyclic TPN over 16 hrs: cyclic TPN over 16 hrs: (provides 102 g of protein and 1811 kcals per day) total volume over 16hrs for calculations=1400 ml (1500 ml total with overfill)  RD Assessment:  Estimated Needs Total Energy Estimated Needs: 1800-2100kcal/day Total Protein Estimated Needs: 90-110g/d Total Fluid Estimated Needs: 1.4-1.6L/day  Current Nutrition:  Soft diet + nutritional supplements, not meeting PO needs yet   Plan: Transitioned to *Cyclic* TPN on [id number].   to run over 16 hours per MD request. [name] at 2000 per discussion with dietician to allow patient time for walking in afternoon/evenings.  To run over 16 hours: -Start rate at 49 mL/hr for 1 hour. -Increase rate to 98 mL/hr for 13 hours.  -Decrease rate to 49 mL/hr for 1 hour. -Decrease rate to 25 mL/hr for 1 hour, then stop.  Plan:  See cyclic TPN rate  above Continue Nutritional Components Amino acids (using 15% Clinisol): 101 g Dextrose 19% = 266 g Lipids (using 20% SMOFlipids): 50.4 g kCal: 1817/24h  Electrolytes in TPN: Na 70 mEq/L, K 69mEq/L, Ca 96mEq/L, Mg 10 mEq/L, Phos 10 mmol/L  Continue ratio Cl:Ac 1:1  Continue trace elements, MVI, chromium, zinc and selenium per dietary recs Continue CBG/SSI for Cyclic TPN:  -CBG 2 hrs after cyclic TPN start -CBG during middle of cyclic TPN infusion -CBG 1 hr after cylic TPN stopped -CBG while off TPN Continue SSI insulin at moderate scale and 12 units of insulin in TPN Check TPN labs on Mondays/once weekly ISO stable labs  Oswald Hillock, PharmD Clinical Pharmacist 11/29/2022 9:48 AM

## 2022-11-29 NOTE — Progress Notes (Signed)
Patient ID: Debra [name], female   DOB: 09-Feb-1966, 57 y.o.   MRN: [id number]     [facility] Day(s): 289.   Interval History: Patient seen and examined, no acute events or new complaints overnight. Patient reports feeling okay this morning.  Patient was found sleeping comfortably.  She woke up easily.  Denies any significant pain.  Denies any changes in his clinical status.  Denies any nausea or vomiting.  Vital signs in last 24 hours: [min-max] current  Temp:  [98 F (36.7 C)-98.3 F (36.8 C)] 98.3 F (36.8 C) (03/23 0805) Pulse Rate:  [68-76] 76 (03/23 0805) Resp:  [14-20] 14 (03/23 0805) BP: (109-123)/(52-61) 109/61 (03/23 0805) SpO2:  [97 %-98 %] 97 % (03/23 0805)     Height: 4\' 11"  (149.9 cm) Weight: 94.9 kg BMI (Calculated): 43.04   Physical Exam:  Constitutional: alert, cooperative and no distress  Respiratory: breathing non-labored at rest  Cardiovascular: regular rate and sinus rhythm  Gastrointestinal: soft, non-tender, and non-distended.  Wound with significant improvement with new skin and granulation tissue.  Labs:     Latest Ref Rng & Units 11/25/2022    9:00 AM 11/24/2022    4:39 AM 11/22/2022    9:09 AM  CBC  WBC 4.0 - 10.5 K/uL  3.4  3.2   Hemoglobin 12.0 - 15.0 g/dL 8.6  7.2  8.0   Hematocrit 36.0 - 46.0 % 27.9  23.0  25.6   Platelets 150 - 400 K/uL  104  118       Latest Ref Rng & Units 11/24/2022    4:39 AM 11/20/2022   10:07 AM 11/18/2022    4:50 AM  CMP  Glucose 70 - 99 mg/dL 175  124  139   BUN 6 - 20 mg/dL 24  22  21    Creatinine 0.44 - 1.00 mg/dL 0.44  0.46  0.42   Sodium 135 - 145 mmol/L 132  134  132   Potassium 3.5 - 5.1 mmol/L 4.4  4.5  4.0   Chloride 98 - 111 mmol/L 103  105  104   CO2 22 - 32 mmol/L 24  24  24    Calcium 8.9 - 10.3 mg/dL 7.7  8.3  8.3   Total Protein 6.5 - 8.1 g/dL 6.1   6.1   Total Bilirubin 0.3 - 1.2 mg/dL 0.4   0.4   Alkaline Phos 38 - 126 U/L 118   136   AST 15 - 41 U/L 32   37   ALT 0 - 44  U/L 25   33     Imaging studies: No new pertinent imaging studies   Assessment/Plan:  57 y.o. female with metastatic colon cancer s/p omentectomy and incisional hernia repair 287 Days Post-Op, complicated by pertinent comorbidities including enterocutaneous fistula.      -Patient with chronic enterocutaneous fistula.  Continue have output. -Continue with nutrition.  Currently on TPN.  Continue with soft diet. -Continue pain control -Continue with wound care management  [name], MD

## 2022-11-30 LAB — GLUCOSE, CAPILLARY
Glucose-Capillary: 101 mg/dL — ABNORMAL HIGH (ref 70–99)
Glucose-Capillary: 116 mg/dL — ABNORMAL HIGH (ref 70–99)

## 2022-11-30 MED ORDER — ZINC CHLORIDE 1 MG/ML IV SOLN
INTRAVENOUS | Status: AC
  Filled 2022-11-30: qty 672

## 2022-11-30 NOTE — Progress Notes (Signed)
PHARMACY - TOTAL PARENTERAL NUTRITION CONSULT NOTE   Indication: Prolonged ileus  Patient Measurements: Height: 4\' 11"  (149.9 cm) Weight: 99.5 kg (219 lb 5.7 oz) IBW/kg (Calculated) : 43.2 TPN AdjBW (KG): 55.2 Body mass index is 44.3 kg/m.  Assessment: Debra [name] is a 57 y.o. female s/p laparotomy, excision of greater omental mass, abdominal wall reconstruction with [name], appendectomy, and placement of Prevena vac.  Glucose / Insulin:  --No apparent history of diabetes --BG controlled on current regimen. No recent BG levels.  --rSSI 4x/day (0500, 1300, 1800, 2200) --SSI last 24h: 5u  --BG/24h: 103-132 --12 units insulin in TPN Electrolytes: Within normal limits Renal: SCr stable at baseline  Hepatic: Liver function overall within normal limits GI Imaging: 9/11 CTAP: no new acute issues GI Surgeries / Procedures: s/p laparotomy, excision of greater omental mass, abdominal wall reconstruction with [name], appendectomy, and placement of Prevena vac  Central access: 02/15/22 TPN start date: 02/15/22  Nutritional Goals: Goal cyclic TPN over 16 hrs: cyclic TPN over 16 hrs: (provides 102 g of protein and 1811 kcals per day) total volume over 16hrs for calculations=1400 ml (1500 ml total with overfill)  RD Assessment:  Estimated Needs Total Energy Estimated Needs: 1800-2100kcal/day Total Protein Estimated Needs: 90-110g/d Total Fluid Estimated Needs: 1.4-1.6L/day  Current Nutrition:  Soft diet + nutritional supplements, not meeting PO needs yet   Plan: Transitioned to *Cyclic* TPN on [id number].   to run over 16 hours per MD request. [name] at 2000 per discussion with dietician to allow patient time for walking in afternoon/evenings.  To run over 16 hours: -Start rate at 49 mL/hr for 1 hour. -Increase rate to 98 mL/hr for 13 hours.  -Decrease rate to 49 mL/hr for 1 hour. -Decrease rate to 25 mL/hr for 1 hour, then stop.  Plan:  See  cyclic TPN rate above Continue Nutritional Components Amino acids (using 15% Clinisol): 101 g Dextrose 19% = 266 g Lipids (using 20% SMOFlipids): 50.4 g kCal: 1817/24h  Electrolytes in TPN: Na 70 mEq/L, K 29mEq/L, Ca 64mEq/L, Mg 10 mEq/L, Phos 10 mmol/L  Continue ratio Cl:Ac 1:1  Continue trace elements, MVI, chromium, zinc and selenium per dietary recs Continue CBG/SSI for Cyclic TPN:  -CBG 2 hrs after cyclic TPN start -CBG during middle of cyclic TPN infusion -CBG 1 hr after cylic TPN stopped -CBG while off TPN Continue SSI insulin at moderate scale and 12 units of insulin in TPN Check TPN labs on Mondays/once weekly ISO stable labs  Oswald Hillock, PharmD Clinical Pharmacist 11/30/2022 9:11 AM

## 2022-11-30 NOTE — Progress Notes (Signed)
Patient ID: Debra [name], female   DOB: July 03, 1966, 57 y.o.   MRN: [id number]     [facility] Hospital Day(s): 290.   Interval History: Patient seen and examined, no acute events or new complaints overnight. Patient reports feeling well.  Denies any new complaint today.  Denies any abdominal pain.  Patient is ready to eat her breakfast.  She is sitting down in chair.  Vital signs in last 24 hours: [min-max] current  Temp:  [97.9 F (36.6 C)-98.4 F (36.9 C)] 98.4 F (36.9 C) (03/24 0843) Pulse Rate:  [76-87] 76 (03/24 0843) Resp:  [16-18] 16 (03/24 0843) BP: (99-119)/(51-78) 106/51 (03/24 0843) SpO2:  [97 %-100 %] 98 % (03/24 0843) Weight:  [99.5 kg] 99.5 kg (03/24 0500)     Height: 4\' 11"  (149.9 cm) Weight: 99.5 kg BMI (Calculated): 43.04   Physical Exam:  Constitutional: alert, cooperative and no distress  Respiratory: breathing non-labored at rest  Cardiovascular: regular rate and sinus rhythm  Gastrointestinal: soft, non-tender, and non-distended. Eaking pouch in place  Labs:     Latest Ref Rng & Units 11/25/2022    9:00 AM 11/24/2022    4:39 AM 11/22/2022    9:09 AM  CBC  WBC 4.0 - 10.5 K/uL  3.4  3.2   Hemoglobin 12.0 - 15.0 g/dL 8.6  7.2  8.0   Hematocrit 36.0 - 46.0 % 27.9  23.0  25.6   Platelets 150 - 400 K/uL  104  118       Latest Ref Rng & Units 11/24/2022    4:39 AM 11/20/2022   10:07 AM 11/18/2022    4:50 AM  CMP  Glucose 70 - 99 mg/dL 175  124  139   BUN 6 - 20 mg/dL 24  22  21    Creatinine 0.44 - 1.00 mg/dL 0.44  0.46  0.42   Sodium 135 - 145 mmol/L 132  134  132   Potassium 3.5 - 5.1 mmol/L 4.4  4.5  4.0   Chloride 98 - 111 mmol/L 103  105  104   CO2 22 - 32 mmol/L 24  24  24    Calcium 8.9 - 10.3 mg/dL 7.7  8.3  8.3   Total Protein 6.5 - 8.1 g/dL 6.1   6.1   Total Bilirubin 0.3 - 1.2 mg/dL 0.4   0.4   Alkaline Phos 38 - 126 U/L 118   136   AST 15 - 41 U/L 32   37   ALT 0 - 44 U/L 25   33     Imaging studies: No new pertinent  imaging studies   Assessment/Plan:  57 y.o. female with metastatic colon cancer s/p omentectomy and incisional hernia repair 288 Days Post-Op, complicated by pertinent comorbidities including enterocutaneous fistula.       -Patient with chronic enterocutaneous fistula.  Continue high output. -Continue with nutrition management.  Currently on TPN.  Continue with soft diet. -Continue pain control -Continue with wound care management  [name], MD

## 2022-11-30 NOTE — Plan of Care (Signed)
  Problem: Clinical Measurements: Goal: Will remain free from infection Outcome: Progressing   Problem: Nutrition: Goal: Adequate nutrition will be maintained Outcome: Progressing   Problem: Activity: Goal: Risk for activity intolerance will decrease Outcome: Progressing   Problem: Nutrition: Goal: Adequate nutrition will be maintained Outcome: Progressing   Problem: Coping: Goal: Level of anxiety will decrease Outcome: Progressing   Problem: Pain Managment: Goal: General experience of comfort will improve Outcome: Progressing   

## 2022-12-01 LAB — GLUCOSE, CAPILLARY
Glucose-Capillary: 162 mg/dL — ABNORMAL HIGH (ref 70–99)
Glucose-Capillary: 135 mg/dL — ABNORMAL HIGH (ref 70–99)
Glucose-Capillary: 108 mg/dL — ABNORMAL HIGH (ref 70–99)
Glucose-Capillary: 114 mg/dL — ABNORMAL HIGH (ref 70–99)
Glucose-Capillary: 156 mg/dL — ABNORMAL HIGH (ref 70–99)
Glucose-Capillary: 115 mg/dL — ABNORMAL HIGH (ref 70–99)
Glucose-Capillary: 154 mg/dL — ABNORMAL HIGH (ref 70–99)
Glucose-Capillary: 139 mg/dL — ABNORMAL HIGH (ref 70–99)
Glucose-Capillary: 114 mg/dL — ABNORMAL HIGH (ref 70–99)

## 2022-12-01 LAB — COMPREHENSIVE METABOLIC PANEL
ALT: 29 U/L (ref 0–44)
AST: 34 U/L (ref 15–41)
Albumin: 2.4 g/dL — ABNORMAL LOW (ref 3.5–5.0)
Alkaline Phosphatase: 122 U/L (ref 38–126)
Anion gap: 5 (ref 5–15)
BUN: 25 mg/dL — ABNORMAL HIGH (ref 6–20)
CO2: 23 mmol/L (ref 22–32)
Calcium: 8.3 mg/dL — ABNORMAL LOW (ref 8.9–10.3)
Chloride: 104 mmol/L (ref 98–111)
Creatinine, Ser: 0.39 mg/dL — ABNORMAL LOW (ref 0.44–1.00)
GFR, Estimated: >60 mL/min (ref >60)
Glucose, Bld: 159 mg/dL — ABNORMAL HIGH (ref 70–99)
Potassium: 4.2 mmol/L (ref 3.5–5.1)
Sodium: 132 mmol/L — ABNORMAL LOW (ref 135–145)
Total Bilirubin: 0.6 mg/dL (ref 0.3–1.2)
Total Protein: 5.7 g/dL — ABNORMAL LOW (ref 6.5–8.1)

## 2022-12-01 LAB — CBC
HCT: 25.1 % — ABNORMAL LOW (ref 36.0–46.0)
Hemoglobin: 8.1 g/dL — ABNORMAL LOW (ref 12.0–15.0)
MCH: 29.5 pg (ref 26.0–34.0)
MCHC: 32.3 g/dL (ref 30.0–36.0)
MCV: 91.3 fL (ref 80.0–100.0)
Platelets: 73 10*3/uL — ABNORMAL LOW (ref 150–400)
RBC: 2.75 MIL/uL — ABNORMAL LOW (ref 3.87–5.11)
RDW: 15.6 % — ABNORMAL HIGH (ref 11.5–15.5)
WBC: 2.9 10*3/uL — ABNORMAL LOW (ref 4.0–10.5)
nRBC: 0 % (ref 0.0–0.2)

## 2022-12-01 LAB — PHOSPHORUS: Phosphorus: 3.4 mg/dL (ref 2.5–4.6)

## 2022-12-01 LAB — MAGNESIUM: Magnesium: 2 mg/dL (ref 1.7–2.4)

## 2022-12-01 MED ORDER — ZINC CHLORIDE 1 MG/ML IV SOLN
INTRAVENOUS | Status: AC
  Filled 2022-12-01: qty 672

## 2022-12-01 NOTE — Progress Notes (Signed)
[facility] SURGICAL PROGRESS NOTE (cpt [phone])  Hospital Day(s): 291.   Post op day(s): 289 Days Post-Op.   Interval History:  Patient seen and examined No issues overnight Doing well; no acute changes  Labs reassuring this morning [name] output 200 ccs + unmeasured; managing effluent independently On TPN; On Soft diet She is ambulating better; reviewed mobility notes   Review of Systems:  Constitutional: denies fever, chills  HEENT: denies cough or congestion  Respiratory: denies any shortness of breath  Cardiovascular: denies chest pain or palpitations  Gastrointestinal: denied abdominal pain, denies N/V Genitourinary: denies burning with urination or urinary frequency Integumentary: + midline wound (stable)   Vital signs in last 24 hours: [min-max] current  Temp:  [97.7 F (36.5 C)-98.8 F (37.1 C)] 98.8 F (37.1 C) (03/25 0731) Pulse Rate:  [71-76] 73 (03/25 0731) Resp:  [16-18] 18 (03/25 0731) BP: (98-108)/(51-59) 99/53 (03/25 0731) SpO2:  [97 %-100 %] 97 % (03/25 0731) Weight:  [100.1 kg] 100.1 kg (03/25 0500)     Height: 4\' 11"  (149.9 cm) Weight: 100.1 kg BMI (Calculated): 43.04   Intake/Output last 2 shifts:  03/24 0701 - 03/25 0700 In: 2361.9 [P.O.:960; I.V.:1401.9] Out: 700 [Drains:500; Stool:200]   Physical Exam:  Constitutional: alert, cooperative and no distress  Respiratory: breathing non-labored at rest  Cardiovascular: regular rate and sinus rhythm  Gastrointestinal: Soft, non-tender, non-distended, no rebound/guarding. Integumentary: Midline wound open, peritoneum closed; granulating; there are three stomatized areas visible in the LUQ and LLQ portions of the wound. No active bleeding.   Labs:     Latest Ref Rng & Units 12/01/2022    6:25 AM 11/25/2022    9:00 AM 11/24/2022    4:39 AM  CBC  WBC 4.0 - 10.5 K/uL 2.9   3.4   Hemoglobin 12.0 - 15.0 g/dL 8.1  8.6  7.2   Hematocrit 36.0 - 46.0 % 25.1  27.9  23.0   Platelets 150 - 400  K/uL 73   104       Latest Ref Rng & Units 12/01/2022    6:25 AM 11/24/2022    4:39 AM 11/20/2022   10:07 AM  CMP  Glucose 70 - 99 mg/dL 159  175  124   BUN 6 - 20 mg/dL 25  24  22    Creatinine 0.44 - 1.00 mg/dL 0.39  0.44  0.46   Sodium 135 - 145 mmol/L 132  132  134   Potassium 3.5 - 5.1 mmol/L 4.2  4.4  4.5   Chloride 98 - 111 mmol/L 104  103  105   CO2 22 - 32 mmol/L 23  24  24    Calcium 8.9 - 10.3 mg/dL 8.3  7.7  8.3   Total Protein 6.5 - 8.1 g/dL 5.7  6.1    Total Bilirubin 0.3 - 1.2 mg/dL 0.6  0.4    Alkaline Phos 38 - 126 U/L 122  118    AST 15 - 41 U/L 34  32    ALT 0 - 44 U/L 29  25      Imaging studies: No new pertinent imaging studies this AM   Assessment/Plan:  57 y.o. female with high output enterocutaneous fistula 289 Days Post-Op s/p re-opening of laparotomy for repair of small bowel perforation following initial laparotomy, excision of greater omental mass, abdominal wall reconstruction with [name], appendectomy, and placement of Prevena vac on 06/08.  - New 03/25: Nothing new; nutritional labs reassuring  - Wound Care (  [name] Pouch); Appreciate WOC RN assistance. No longer on intermittent or continuous suction. Patient seems to like this better and has started to manage effluent independently. She will need intermittent suction with Yankauer to control effluent. Likely be beneficial to suction before ambulation.    - Continue soft diet as tolerated  - Continue cyclic TPN; weekly nutritional labs - Appreciate dietary assistance             - Monitor abdominal examination; on-going bowel function            - Pain control prn; antemetic prn            - Progressed with therapies; no longer any recommendations; ambulating well    - Appreciate neurosurgery assistance with compression fracture; serial XRs   - Discharge Planning: Continue to anticipate lengthy admission +/- possible transfer; no new progress regarding disposition unfortunately   All  of the above findings and recommendations were discussed with the patient, and the medical team, and all of patient's questions were answered to her expressed satisfaction.  -- [name], PA-C [facility] 12/01/2022, 7:54 AM M-F: 7am - 4pm

## 2022-12-01 NOTE — Progress Notes (Signed)
PHARMACY - TOTAL PARENTERAL NUTRITION CONSULT NOTE   Indication: Prolonged ileus  Patient Measurements: Height: 4\' 11"  (149.9 cm) Weight: 100.1 kg (220 lb 10.9 oz) IBW/kg (Calculated) : 43.2 TPN AdjBW (KG): 55.2 Body mass index is 44.57 kg/m.  Assessment: Debra [name] is a 57 y.o. female s/p laparotomy, excision of greater omental mass, abdominal wall reconstruction with [name], appendectomy, and placement of Prevena vac.  Glucose / Insulin:  --No apparent history of diabetes --BG controlled on current regimen. No recent BG levels.  --rSSI 4x/day (0500, 1300, 1800, 2200) --SSI last 24h: 5u  --BG/24h: 103-132 --12 units insulin in TPN Electrolytes: Within normal limits Renal: SCr stable at baseline  Hepatic: Liver function overall within normal limits GI Imaging: 9/11 CTAP: no new acute issues GI Surgeries / Procedures: s/p laparotomy, excision of greater omental mass, abdominal wall reconstruction with [name], appendectomy, and placement of Prevena vac  Central access: 02/15/22 TPN start date: 02/15/22  Nutritional Goals: Goal cyclic TPN over 16 hrs: cyclic TPN over 16 hrs: (provides 102 g of protein and 1811 kcals per day) total volume over 16hrs for calculations=1400 ml (1500 ml total with overfill)  RD Assessment:  Estimated Needs Total Energy Estimated Needs: 1800-2100kcal/day Total Protein Estimated Needs: 90-110g/d Total Fluid Estimated Needs: 1.4-1.6L/day  Current Nutrition:  Soft diet + nutritional supplements, not meeting PO needs yet   Plan: Transitioned to *Cyclic* TPN on [id number].   to run over 16 hours per MD request. [name] at 2000 per discussion with dietician to allow patient time for walking in afternoon/evenings.  To run over 16 hours: -Start rate at 49 mL/hr for 1 hour. -Increase rate to 98 mL/hr for 13 hours.  -Decrease rate to 49 mL/hr for 1 hour. -Decrease rate to 25 mL/hr for 1 hour, then stop.  Plan:  See  cyclic TPN rate above Continue Nutritional Components Amino acids (using 15% Clinisol): 101 g Dextrose 19% = 266 g Lipids (using 20% SMOFlipids): 50.4 g kCal: 1817/24h  Electrolytes in TPN: Na 70 mEq/L, K 28mEq/L, Ca 6mEq/L, Mg 10 mEq/L, Phos 10 mmol/L  Continue ratio Cl:Ac 1:1  Continue trace elements, MVI, chromium, zinc and selenium per dietary recs Continue CBG/SSI for Cyclic TPN:  -CBG 2 hrs after cyclic TPN start -CBG during middle of cyclic TPN infusion -CBG 1 hr after cylic TPN stopped -CBG while off TPN Continue SSI insulin at moderate scale and 12 units of insulin in TPN Check TPN labs on Mondays/once weekly ISO stable labs   Glean Salvo, PharmD, BCPS Clinical Pharmacist  12/01/2022 7:22 AM

## 2022-12-02 LAB — GLUCOSE, CAPILLARY
Glucose-Capillary: 122 mg/dL — ABNORMAL HIGH (ref 70–99)
Glucose-Capillary: 140 mg/dL — ABNORMAL HIGH (ref 70–99)
Glucose-Capillary: 104 mg/dL — ABNORMAL HIGH (ref 70–99)
Glucose-Capillary: 94 mg/dL (ref 70–99)
Glucose-Capillary: 152 mg/dL — ABNORMAL HIGH (ref 70–99)

## 2022-12-02 MED ORDER — ZINC CHLORIDE 1 MG/ML IV SOLN
INTRAVENOUS | Status: AC
  Filled 2022-12-02: qty 672

## 2022-12-02 NOTE — Progress Notes (Addendum)
[facility] SURGICAL PROGRESS NOTE (cpt [phone])  Hospital Day(s): 292.   Post op day(s): 290 Days Post-Op.   Interval History:  Patient seen and examined Did have clots in [name] overnight with ambulation; improved Hgb remains within her baseline [name] output 250 ccs + unmeasured; managing effluent independently On TPN; On Soft diet She is ambulating better; reviewed mobility notes   Review of Systems:  Constitutional: denies fever, chills  HEENT: denies cough or congestion  Respiratory: denies any shortness of breath  Cardiovascular: denies chest pain or palpitations  Gastrointestinal: denied abdominal pain, denies N/V Genitourinary: denies burning with urination or urinary frequency Integumentary: + midline wound (stable)   Vital signs in last 24 hours: [min-max] current  Temp:  [98.2 F (36.8 C)-98.6 F (37 C)] 98.6 F (37 C) (03/26 0542) Pulse Rate:  [75-76] 76 (03/26 0542) Resp:  [16-18] 16 (03/26 0542) BP: (104-115)/(53-69) 104/53 (03/26 0542) SpO2:  [99 %-100 %] 99 % (03/26 0542) Weight:  [97.4 kg] 97.4 kg (03/26 0500)     Height: 4\' 11"  (149.9 cm) Weight: 97.4 kg BMI (Calculated): 43.04   Intake/Output last 2 shifts:  03/25 0701 - 03/26 0700 In: 1296.5 [I.V.:1296.5] Out: 251 [Urine:1; Stool:250]   Physical Exam:  Constitutional: alert, cooperative and no distress  Respiratory: breathing non-labored at rest  Cardiovascular: regular rate and sinus rhythm  Gastrointestinal: Soft, non-tender, non-distended, no rebound/guarding. Integumentary: Midline wound open, peritoneum closed; granulating; there are three stomatized areas visible in the LUQ and LLQ portions of the wound. No active bleeding.   Labs:     Latest Ref Rng & Units 12/01/2022    6:25 AM 11/25/2022    9:00 AM 11/24/2022    4:39 AM  CBC  WBC 4.0 - 10.5 K/uL 2.9   3.4   Hemoglobin 12.0 - 15.0 g/dL 8.1  8.6  7.2   Hematocrit 36.0 - 46.0 % 25.1  27.9  23.0   Platelets 150 - 400 K/uL  73   104       Latest Ref Rng & Units 12/01/2022    6:25 AM 11/24/2022    4:39 AM 11/20/2022   10:07 AM  CMP  Glucose 70 - 99 mg/dL 159  175  124   BUN 6 - 20 mg/dL 25  24  22    Creatinine 0.44 - 1.00 mg/dL 0.39  0.44  0.46   Sodium 135 - 145 mmol/L 132  132  134   Potassium 3.5 - 5.1 mmol/L 4.2  4.4  4.5   Chloride 98 - 111 mmol/L 104  103  105   CO2 22 - 32 mmol/L 23  24  24    Calcium 8.9 - 10.3 mg/dL 8.3  7.7  8.3   Total Protein 6.5 - 8.1 g/dL 5.7  6.1    Total Bilirubin 0.3 - 1.2 mg/dL 0.6  0.4    Alkaline Phos 38 - 126 U/L 122  118    AST 15 - 41 U/L 34  32    ALT 0 - 44 U/L 29  25      Imaging studies: No new pertinent imaging studies this AM   Assessment/Plan:  57 y.o. female with high output enterocutaneous fistula 290 Days Post-Op s/p re-opening of laparotomy for repair of small bowel perforation following initial laparotomy, excision of greater omental mass, abdominal wall reconstruction with [name], appendectomy, and placement of Prevena vac on 06/08.  - New 03/26: Overnight clots, no active bleed - present during pouch  change this afternoon, there was a small amount of exposed mesh superiorly; removed sharply without issue.   - Wound Care ([name] Pouch); Appreciate WOC RN assistance. No longer on intermittent or continuous suction. Patient seems to like this better and has started to manage effluent independently. She will need intermittent suction with Yankauer to control effluent. Likely be beneficial to suction before ambulation.    - Continue soft diet as tolerated  - Continue cyclic TPN; weekly nutritional labs - Appreciate dietary assistance             - Monitor abdominal examination; on-going bowel function            - Pain control prn; antemetic prn            - Progressed with therapies; no longer any recommendations; ambulating well    - Appreciate neurosurgery assistance with compression fracture; serial XRs   - Discharge Planning:  Continue to anticipate lengthy admission +/- possible transfer; no new progress regarding disposition unfortunately   All of the above findings and recommendations were discussed with the patient, and the medical team, and all of patient's questions were answered to her expressed satisfaction.  -- [name], PA-C [facility] 12/02/2022, 7:43 AM M-F: 7am - 4pm

## 2022-12-02 NOTE — Progress Notes (Signed)
Nutrition Follow-up  DOCUMENTATION CODES:   Obesity unspecified  INTERVENTION:   Continue cyclic TPN per pharmacy (16 hrs)- provides 1811kcal/day and 102g/day protein   Ensure Enlive po BID, each supplement provides 350 kcal and 20 grams of protein.  MVI, chromium and trace elements in TPN   Triglycerides checked monthly   Daily weights   Vitamin D2- 50,000 units po weekly   Zinc additional 5mg  daily added to TPN   Selenium additional 73mcg daily added to TPN   Routine vitamin labs due 4/9- will include carnitine and aluminum.   NUTRITION DIAGNOSIS:   Increased nutrient needs related to wound healing, catabolic illness as evidenced by estimated needs. -ongoing   GOAL:   Patient will meet greater than or equal to 90% of their needs -met with TPN   MONITOR:   PO intake, Supplement acceptance, Labs, Weight trends, Diet advancement, I & O's, TPN  ASSESSMENT:   57 y/o female with h/o hypothyroidism, COVID 19 (3/21), kidney stones and stage 3 colon cancer (s/p left hemicolectomy 5/21 and chemotherapy) who is admitted for new pelvic mass now s/p laparotomy 6/8 (with excision of pelvic mass from greater omentum, abdominal wall reconstruction with bilateral myocutaneous flaps and mesh, incisional hernia repair, appendectomy repair and VAC placement) complicated by bowel perforation s/p reopening of recent laparotomy 6/10 (with repair of small bowel perforation, excision of mesh, placement of two phasix mesh and VAC placement). Pathology returned as metastatic adenocarcinoma. Pt with L1 compression fracture.   Pt continues to tolerate TPN well at goal rate; TPN is currently being cycled for 16 hrs. Triglycerides wnl and are being checked monthly. Hyperglycemia improved with insulin in TPN. Volume status being monitored; pt up ~17lbs since admission. Will check routine vitamin labs every 2-3 months as pt on chronic TPN (next due 4/9). Pt continues to have poor oral intake; pt eating  <25% of meals. Pt on soft diet. [name] pouch with 250 ml + unmeasured output. No discharge plan in place.   Medications reviewed and include: D2, insulin, synthroid, protonix, TPN  -Selenium 92(L), zinc 66 wnl- 3/15 -Chromium- 2.4(H), copper- 105 wnl, Manganese- 13.1 wnl, vitamin D 34.2 wnl, Iodine 74.2 wnl- 2/8 -Iron 23(L), TIBC 364, ferritin 17 wnl, transferrin 261 wnl, folate 14.5 wnl, B12 585- 2/8 -Vitamin B1- 75.8, vitamin A- 27.1, B6- 21.9 wnl, vitamin C- 0.7, vitamin E- 11.6 wnl, vitamin K- 1.03 wnl- 2/8  Labs reviewed: Na 132(L), K 4.2 wnl, BUN 25(H), P 3.4 wnl, Mg 2.0 wnl- 3/25 Prealbumin- 12(L)- 3/18 Triglycerides- 129- 3/4 Wbc- 2.9(L), Hgb 8.1(L), Hct 25.1(L) Cbgs- 140, 122, 114, 139, 154 x 24 hrs  Diet Order:    Diet Order             DIET SOFT Room service appropriate? Yes; Fluid consistency: Thin  Diet effective now                  EDUCATION NEEDS:   Not appropriate for education at this time  Skin:  Skin Assessment: Reviewed RN Assessment ( Midline wound: 11cm x 12cm )  Last BM:  3/24- type 7  Height:   Ht Readings from Last 1 Encounters:  03/01/22 4\' 11"  (1.499 m)    Weight:   Wt Readings from Last 1 Encounters:  12/02/22 97.4 kg    Ideal Body Weight:  44.3 kg  BMI:  Body mass index is 43.37 kg/m.  Estimated Nutritional Needs:   Kcal:  1800-2100kcal/day  Protein:  90-110g/d  Fluid:  1.4-1.6L/day  Koleen Distance MS, RD, LDN Please refer to [facility] for RD and/or RD on-call/weekend/after hours pager

## 2022-12-02 NOTE — Progress Notes (Signed)
PHARMACY - TOTAL PARENTERAL NUTRITION CONSULT NOTE   Indication: Prolonged ileus  Patient Measurements: Height: 4\' 11"  (149.9 cm) Weight: 97.4 kg (214 lb 11.7 oz) IBW/kg (Calculated) : 43.2 TPN AdjBW (KG): 55.2 Body mass index is 43.37 kg/m.  Assessment: Debra [name] is a 57 y.o. female s/p laparotomy, excision of greater omental mass, abdominal wall reconstruction with [name], appendectomy, and placement of Prevena vac.  Glucose / Insulin:  --No apparent history of diabetes --BG controlled on current regimen. No recent BG levels.  --rSSI 4x/day (0500, 1300, 1800, 2200) --SSI last 24h: 12 units  --BG/24h: 122-154 --12 units insulin in TPN Electrolytes: Within normal limits Renal: SCr stable at baseline  Hepatic: Liver function overall within normal limits GI Imaging: 9/11 CTAP: no new acute issues GI Surgeries / Procedures: s/p laparotomy, excision of greater omental mass, abdominal wall reconstruction with [name], appendectomy, and placement of Prevena vac  Central access: 02/15/22 TPN start date: 02/15/22  Nutritional Goals: Goal cyclic TPN over 16 hrs: cyclic TPN over 16 hrs: (provides 102 g of protein and 1811 kcals per day) total volume over 16hrs for calculations=1400 ml (1500 ml total with overfill)  RD Assessment:  Estimated Needs Total Energy Estimated Needs: 1800-2100kcal/day Total Protein Estimated Needs: 90-110g/d Total Fluid Estimated Needs: 1.4-1.6L/day  Current Nutrition:  Soft diet + nutritional supplements, not meeting PO needs yet   Plan: Transitioned to *Cyclic* TPN on [id number].   to run over 16 hours per MD request. [name] at 2000 per discussion with dietician to allow patient time for walking in afternoon/evenings.  To run over 16 hours: -Start rate at 49 mL/hr for 1 hour. -Increase rate to 98 mL/hr for 13 hours.  -Decrease rate to 49 mL/hr for 1 hour. -Decrease rate to 25 mL/hr for 1 hour, then stop.  Plan:   See cyclic TPN rate above Continue Nutritional Components Amino acids (using 15% Clinisol): 101 g Dextrose 19% = 266 g Lipids (using 20% SMOFlipids): 50.4 g kCal: 1817/24h  Electrolytes in TPN: Na 70 mEq/L, K 61mEq/L, Ca 48mEq/L, Mg 10 mEq/L, Phos 10 mmol/L  Continue ratio Cl:Ac 1:1  Continue trace elements, MVI, chromium, zinc and selenium per dietary recs Continue CBG/SSI for Cyclic TPN:  -CBG 2 hrs after cyclic TPN start -CBG during middle of cyclic TPN infusion -CBG 1 hr after cylic TPN stopped -CBG while off TPN Continue SSI insulin at moderate scale and 12 units of insulin in TPN Check TPN labs on Mondays/once weekly ISO stable labs   Paulina Fusi, PharmD, BCPS 12/02/2022 11:05 AM

## 2022-12-02 NOTE — Plan of Care (Signed)
Pt alert and oriented x 4. Pt up adlib when no TPN is infusing with walker. When infusing pt gets up 1 assist to bathroom. BS during shift has been 122 and 140. Vitals stable. No prns given. Pt did have bleeding and clots from eakins pouch after pt got up to bathroom. Once pt was in bed no further bleeding noted. Pt has hx of bleeding and clots periodically. Msg sent to MD this am to update. No new orders received.  Problem: Clinical Measurements: Goal: Will remain free from infection Outcome: Progressing   Problem: Nutrition: Goal: Adequate nutrition will be maintained Outcome: Progressing   Problem: Activity: Goal: Risk for activity intolerance will decrease Outcome: Progressing   Problem: Nutrition: Goal: Adequate nutrition will be maintained Outcome: Progressing   Problem: Coping: Goal: Level of anxiety will decrease Outcome: Progressing   Problem: Pain Managment: Goal: General experience of comfort will improve Outcome: Progressing

## 2022-12-03 LAB — GLUCOSE, CAPILLARY
Glucose-Capillary: 151 mg/dL — ABNORMAL HIGH (ref 70–99)
Glucose-Capillary: 105 mg/dL — ABNORMAL HIGH (ref 70–99)
Glucose-Capillary: 104 mg/dL — ABNORMAL HIGH (ref 70–99)
Glucose-Capillary: 139 mg/dL — ABNORMAL HIGH (ref 70–99)

## 2022-12-03 MED ORDER — ZINC CHLORIDE 1 MG/ML IV SOLN
INTRAVENOUS | Status: AC
  Filled 2022-12-03: qty 672

## 2022-12-03 NOTE — Progress Notes (Signed)
PHARMACY - TOTAL PARENTERAL NUTRITION CONSULT NOTE   Indication: Prolonged ileus  Patient Measurements: Height: 4\' 11"  (149.9 cm) Weight: 97.4 kg (214 lb 11.7 oz) IBW/kg (Calculated) : 43.2 TPN AdjBW (KG): 55.2 Body mass index is 43.37 kg/m.  Assessment: Debra [name] is a 57 y.o. female s/p laparotomy, excision of greater omental mass, abdominal wall reconstruction with [name], appendectomy, and placement of Prevena vac.  Glucose / Insulin:  --No apparent history of diabetes --BG controlled on current regimen. No recent BG levels.  --rSSI 4x/day (0500, 1300, 1800, 2200) --SSI last 24h: 12 units  --BG/24h: 122-154 --12 units insulin in TPN Electrolytes: Within normal limits Renal: SCr stable at baseline  Hepatic: Liver function overall within normal limits GI Imaging: 9/11 CTAP: no new acute issues GI Surgeries / Procedures: s/p laparotomy, excision of greater omental mass, abdominal wall reconstruction with [name], appendectomy, and placement of Prevena vac  Central access: 02/15/22 TPN start date: 02/15/22  Nutritional Goals: Goal cyclic TPN over 16 hrs: cyclic TPN over 16 hrs: (provides 102 g of protein and 1811 kcals per day) total volume over 16hrs for calculations=1400 ml (1500 ml total with overfill)  RD Assessment:  Estimated Needs Total Energy Estimated Needs: 1800-2100kcal/day Total Protein Estimated Needs: 90-110g/d Total Fluid Estimated Needs: 1.4-1.6L/day  Current Nutrition:  Soft diet + nutritional supplements, not meeting PO needs yet   Plan: Transitioned to *Cyclic* TPN on [id number].   to run over 16 hours per MD request. [name] at 2000 per discussion with dietician to allow patient time for walking in afternoon/evenings.  To run over 16 hours: -Start rate at 49 mL/hr for 1 hour. -Increase rate to 98 mL/hr for 13 hours.  -Decrease rate to 49 mL/hr for 1 hour. -Decrease rate to 25 mL/hr for 1 hour, then stop.  Plan:   See cyclic TPN rate above Continue Nutritional Components Amino acids (using 15% Clinisol): 101 g Dextrose 19% = 266 g Lipids (using 20% SMOFlipids): 50.4 g kCal: 1817/24h  Electrolytes in TPN: Na 70 mEq/L, K 78mEq/L, Ca 25mEq/L, Mg 10 mEq/L, Phos 10 mmol/L  Continue ratio Cl:Ac 1:1  Continue trace elements, MVI, chromium, zinc and selenium per dietary recs Continue CBG/SSI for Cyclic TPN:  -CBG 2 hrs after cyclic TPN start -CBG during middle of cyclic TPN infusion -CBG 1 hr after cylic TPN stopped -CBG while off TPN Continue SSI insulin at moderate scale and 12 units of insulin in TPN Check TPN labs on Mondays/once weekly ISO stable labs   Glean Salvo, PharmD, BCPS Clinical Pharmacist  12/03/2022 7:32 AM

## 2022-12-03 NOTE — Progress Notes (Signed)
[facility] (cpt [phone])  Hospital Day(s): 293.   Post op day(s): 291 Days Post-Op.   Interval History:  Patient seen and examined No issues overnight No new complaints  [name] output unmeasured; managing effluent independently On TPN; On Soft diet She is ambulating better; reviewed mobility notes   Review of Systems:  Constitutional: denies fever, chills  HEENT: denies cough or congestion  Respiratory: denies any shortness of breath  Cardiovascular: denies chest pain or palpitations  Gastrointestinal: denied abdominal pain, denies N/V Genitourinary: denies burning with urination or urinary frequency Integumentary: + midline wound (stable)   Vital signs in last 24 hours: [min-max] current  Temp:  [98 F (36.7 C)-98.4 F (36.9 C)] 98 F (36.7 C) (03/27 0450) Pulse Rate:  [66-78] 75 (03/27 0450) Resp:  [17-18] 17 (03/27 0450) BP: (102-112)/(58-72) 112/69 (03/27 0450) SpO2:  [98 %-99 %] 99 % (03/27 0450)     Height: 4\' 11"  (149.9 cm) Weight: 97.4 kg BMI (Calculated): 43.04   Intake/Output last 2 shifts:  03/26 0701 - 03/27 0700 In: 37.4 [I.V.:37.4] Out: -    Physical Exam:  Constitutional: alert, cooperative and no distress  Respiratory: breathing non-labored at rest  Cardiovascular: regular rate and sinus rhythm  Gastrointestinal: Soft, non-tender, non-distended, no rebound/guarding. Integumentary: Midline wound open, peritoneum closed; granulating; there are three stomatized areas visible in the LUQ and LLQ portions of the wound. No active bleeding.   Labs:     Latest Ref Rng & Units 12/01/2022    6:25 AM 11/25/2022    9:00 AM 11/24/2022    4:39 AM  CBC  WBC 4.0 - 10.5 K/uL 2.9   3.4   Hemoglobin 12.0 - 15.0 g/dL 8.1  8.6  7.2   Hematocrit 36.0 - 46.0 % 25.1  27.9  23.0   Platelets 150 - 400 K/uL 73   104       Latest Ref Rng & Units 12/01/2022    6:25 AM 11/24/2022    4:39 AM 11/20/2022   10:07 AM  CMP  Glucose 70 - 99  mg/dL 159  175  124   BUN 6 - 20 mg/dL 25  24  22    Creatinine 0.44 - 1.00 mg/dL 0.39  0.44  0.46   Sodium 135 - 145 mmol/L 132  132  134   Potassium 3.5 - 5.1 mmol/L 4.2  4.4  4.5   Chloride 98 - 111 mmol/L 104  103  105   CO2 22 - 32 mmol/L 23  24  24    Calcium 8.9 - 10.3 mg/dL 8.3  7.7  8.3   Total Protein 6.5 - 8.1 g/dL 5.7  6.1    Total Bilirubin 0.3 - 1.2 mg/dL 0.6  0.4    Alkaline Phos 38 - 126 U/L 122  118    AST 15 - 41 U/L 34  32    ALT 0 - 44 U/L 29  25      Imaging studies: No new pertinent imaging studies this AM   Assessment/Plan:  57 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  - New 03/26: No new issues this morning   - Wound Care ([name] Pouch); Appreciate WOC RN assistance. No longer on intermittent or continuous suction. Patient seems to like this  better and has started to manage effluent independently. She will need intermittent suction with Yankauer to control effluent. Likely be beneficial to suction before ambulation.    - Continue soft diet as tolerated  - Continue cyclic TPN; weekly nutritional labs - Appreciate dietary assistance             - Monitor abdominal examination; on-going bowel function            - Pain control prn; antemetic prn            - Progressed with therapies; no longer any recommendations; ambulating well    - Appreciate neurosurgery assistance with compression fracture; serial XRs   - Discharge Planning: Continue to anticipate lengthy admission +/- possible transfer; no new progress regarding disposition unfortunately   All of the above findings and recommendations were discussed with the patient, and the medical team, and all of patient's questions were answered to her  expressed satisfaction.  -- Debra Simon, PA-C [facility] 12/03/2022, 7:40 AM M-F: 7am - 4pm

## 2022-12-04 LAB — GLUCOSE, CAPILLARY
Glucose-Capillary: 149 mg/dL — ABNORMAL HIGH (ref 70–99)
Glucose-Capillary: 177 mg/dL — ABNORMAL HIGH (ref 70–99)
Glucose-Capillary: 88 mg/dL (ref 70–99)

## 2022-12-04 MED ORDER — ZINC CHLORIDE 1 MG/ML IV SOLN
INTRAVENOUS | Status: AC
  Filled 2022-12-04: qty 672

## 2022-12-04 NOTE — Progress Notes (Signed)
[facility] [facility] PROGRESS [facility] (cpt [phone])  Hospital Day(s): 294.   Post op day(s): 292 Days Post-Op.   Interval History:  Patient seen and examined No issues overnight No new complaints  No new labs currently [name] output 125 ccs + unmeasured; managing effluent independently On TPN; On Soft diet She is ambulating better; reviewed mobility notes   Review of Systems:  Constitutional: denies fever, chills  HEENT: denies cough or congestion  Respiratory: denies any shortness of breath  Cardiovascular: denies chest pain or palpitations  Gastrointestinal: denied abdominal pain, denies N/V Genitourinary: denies burning with urination or urinary frequency Integumentary: + midline wound (stable)   Vital signs in last 24 hours: [min-max] current  Temp:  [98.1 F (36.7 C)-98.4 F (36.9 C)] 98.3 F (36.8 C) (03/28 0513) Pulse Rate:  [71-79] 74 (03/28 0513) Resp:  [16-24] 16 (03/28 0513) BP: (97-106)/(48-62) 101/52 (03/28 0513) SpO2:  [97 %-100 %] 99 % (03/28 0513) Weight:  [100.3 kg] 100.3 kg (03/28 0500)     Height: 4\' 11"  (149.9 cm) Weight: 100.3 kg BMI (Calculated): 43.04   Intake/Output last 2 shifts:  03/27 0701 - 03/28 0700 In: 1469.2 [P.O.:480; I.V.:989.2] Out: 125 [Drains:125]   Physical Exam:  Constitutional: alert, cooperative and no distress  Respiratory: breathing non-labored at rest  Cardiovascular: regular rate and sinus rhythm  Gastrointestinal: Soft, non-tender, non-distended, no rebound/guarding. Integumentary: Midline wound open, peritoneum closed; granulating and new skin growing over; there are three stomatized areas visible in the LUQ and LLQ portions of the wound. No active bleeding.   Labs:     Latest Ref Rng & Units 12/01/2022    6:25 AM 11/25/2022    9:00 AM 11/24/2022    4:39 AM  CBC  WBC 4.0 - 10.5 K/uL 2.9   3.4   Hemoglobin 12.0 - 15.0 g/dL 8.1  8.6  7.2   Hematocrit 36.0 - 46.0 % 25.1  27.9  23.0   Platelets 150 - 400  K/uL 73   104       Latest Ref Rng & Units 12/01/2022    6:25 AM 11/24/2022    4:39 AM 11/20/2022   10:07 AM  CMP  Glucose 70 - 99 mg/dL 159  175  124   BUN 6 - 20 mg/dL 25  24  22    Creatinine 0.44 - 1.00 mg/dL 0.39  0.44  0.46   Sodium 135 - 145 mmol/L 132  132  134   Potassium 3.5 - 5.1 mmol/L 4.2  4.4  4.5   Chloride 98 - 111 mmol/L 104  103  105   CO2 22 - 32 mmol/L 23  24  24    Calcium 8.9 - 10.3 mg/dL 8.3  7.7  8.3   Total Protein 6.5 - 8.1 g/dL 5.7  6.1    Total Bilirubin 0.3 - 1.2 mg/dL 0.6  0.4    Alkaline Phos 38 - 126 U/L 122  118    AST 15 - 41 U/L 34  32    ALT 0 - 44 U/L 29  25      Imaging studies: No new pertinent imaging studies this AM   Assessment/Plan:  57 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  - New 03/27: No new issues this morning   -  Wound Care ([name] Pouch); Appreciate WOC RN assistance. No longer on intermittent or continuous suction. Patient seems to like this better and has started to manage effluent independently. She will need intermittent suction with Yankauer to control effluent. Likely be beneficial to suction before ambulation.    - Continue soft diet as tolerated  - Continue cyclic TPN; weekly nutritional labs - Appreciate dietary assistance             - Monitor abdominal examination; on-going bowel function            - Pain control prn; antemetic prn            - Progressed with therapies; no longer any recommendations; ambulating well    - Appreciate neurosurgery assistance with compression fracture; serial XRs   - Discharge Planning: Continue to anticipate lengthy admission +/- possible transfer; no new progress regarding disposition unfortunately   All of the above  findings and recommendations were discussed with the patient, and the medical team, and all of patient's questions were answered to her expressed satisfaction.  -- [name], PA-C [facility] 12/04/2022, 7:40 AM M-F: 7am - 4pm

## 2022-12-04 NOTE — Progress Notes (Signed)
PHARMACY - TOTAL PARENTERAL NUTRITION CONSULT NOTE   Indication: Prolonged ileus  Patient Measurements: Height: 4\' 11"  (149.9 cm) Weight: 100.3 kg (221 lb 1.9 oz) IBW/kg (Calculated) : 43.2 TPN AdjBW (KG): 55.2 Body mass index is 44.66 kg/m.  Assessment: Debra [name] is a 57 y.o. female s/p laparotomy, excision of greater omental mass, abdominal wall reconstruction with [name], appendectomy, and placement of Prevena vac.  Glucose / Insulin:  --No apparent history of diabetes --BG controlled on current regimen. No recent BG levels.  --rSSI 4x/day (0500, 1300, 1800, 2200) --SSI last 24h: 12 units  --BG/24h: 122-154 --12 units insulin in TPN Electrolytes: Within normal limits Renal: SCr stable at baseline  Hepatic: Liver function overall within normal limits GI Imaging: 9/11 CTAP: no new acute issues GI Surgeries / Procedures: s/p laparotomy, excision of greater omental mass, abdominal wall reconstruction with [name], appendectomy, and placement of Prevena vac  Central access: 02/15/22 TPN start date: 02/15/22  Nutritional Goals: Goal cyclic TPN over 16 hrs: cyclic TPN over 16 hrs: (provides 102 g of protein and 1811 kcals per day) total volume over 16hrs for calculations=1400 ml (1500 ml total with overfill)  RD Assessment:  Estimated Needs Total Energy Estimated Needs: 1800-2100kcal/day Total Protein Estimated Needs: 90-110g/d Total Fluid Estimated Needs: 1.4-1.6L/day  Current Nutrition:  Soft diet + nutritional supplements, not meeting PO needs yet   Plan: Transitioned to *Cyclic* TPN on [id number].   to run over 16 hours per MD request. [name] at 2000 per discussion with dietician to allow patient time for walking in afternoon/evenings.  To run over 16 hours: -Start rate at 49 mL/hr for 1 hour. -Increase rate to 98 mL/hr for 13 hours.  -Decrease rate to 49 mL/hr for 1 hour. -Decrease rate to 25 mL/hr for 1 hour, then stop.  Plan:   See cyclic TPN rate above Continue Nutritional Components Amino acids (using 15% Clinisol): 101 g Dextrose 19% = 266 g Lipids (using 20% SMOFlipids): 50.4 g kCal: 1817/24h  Electrolytes in TPN: Na 70 mEq/L, K 65mEq/L, Ca 77mEq/L, Mg 10 mEq/L, Phos 10 mmol/L  Continue ratio Cl:Ac 1:1  Continue trace elements, MVI, chromium, zinc and selenium per dietary recs Continue CBG/SSI for Cyclic TPN:  -CBG 2 hrs after cyclic TPN start -CBG during middle of cyclic TPN infusion -CBG 1 hr after cylic TPN stopped -CBG while off TPN Continue SSI insulin at moderate scale and 12 units of insulin in TPN Check TPN labs on Mondays/once weekly ISO stable labs   Glean Salvo, PharmD, BCPS Clinical Pharmacist  12/04/2022 7:19 AM

## 2022-12-05 LAB — GLUCOSE, CAPILLARY
Glucose-Capillary: 139 mg/dL — ABNORMAL HIGH (ref 70–99)
Glucose-Capillary: 125 mg/dL — ABNORMAL HIGH (ref 70–99)
Glucose-Capillary: 124 mg/dL — ABNORMAL HIGH (ref 70–99)
Glucose-Capillary: 83 mg/dL (ref 70–99)
Glucose-Capillary: 134 mg/dL — ABNORMAL HIGH (ref 70–99)

## 2022-12-05 MED ORDER — ZINC CHLORIDE 1 MG/ML IV SOLN
INTRAVENOUS | Status: AC
  Filled 2022-12-05: qty 672

## 2022-12-05 NOTE — TOC Progression Note (Signed)
Transition of Care [facility]) - Progression Note    Patient Details  Name: Debra [name] MRN: [id number] Date of Birth: [birth year]  [facility] CM/SW Contact  Laurena Slimmer, RN Phone Number: 12/05/2022, 2:04 PM  Clinical Narrative:    Patient remains on TPN.    Expected Discharge Plan: [place] Barriers to Discharge: Continued Medical Work up  Expected Discharge Plan and Services                                               Social Determinants of Health (SDOH) Interventions SDOH Screenings   Food Insecurity: No Food Insecurity (05/20/2022)  Housing: Low Risk  (05/20/2022)  Transportation Needs: No Transportation Needs (05/20/2022)  Utilities: Not At Risk (05/20/2022)  Tobacco Use: Low Risk  (09/12/2022)    Readmission Risk Interventions     No data to display

## 2022-12-05 NOTE — Progress Notes (Signed)
PHARMACY - TOTAL PARENTERAL NUTRITION CONSULT NOTE   Indication: Prolonged ileus  Patient Measurements: Height: 4\' 11"  (149.9 cm) Weight: 98.7 kg (217 lb 9.5 oz) IBW/kg (Calculated) : 43.2 TPN AdjBW (KG): 55.2 Body mass index is 43.95 kg/m.  Assessment: Debra [name] is a 57 y.o. female s/p laparotomy, excision of greater omental mass, abdominal wall reconstruction with [name], appendectomy, and placement of Prevena vac.  Glucose / Insulin:  --No apparent history of diabetes --BG controlled on current regimen. No recent BG levels.  --rSSI 4x/day (0500, 1300, 1800, 2200) --SSI last 24h: 12 units  --BG/24h: 122-154 --12 units insulin in TPN Electrolytes: Within normal limits Renal: SCr stable at baseline  Hepatic: Liver function overall within normal limits GI Imaging: 9/11 CTAP: no new acute issues GI Surgeries / Procedures: s/p laparotomy, excision of greater omental mass, abdominal wall reconstruction with [name], appendectomy, and placement of Prevena vac  Central access: 02/15/22 TPN start date: 02/15/22  Nutritional Goals: Goal cyclic TPN over 16 hrs: cyclic TPN over 16 hrs: (provides 102 g of protein and 1811 kcals per day) total volume over 16hrs for calculations=1400 ml (1500 ml total with overfill)  RD Assessment:  Estimated Needs Total Energy Estimated Needs: 1800-2100kcal/day Total Protein Estimated Needs: 90-110g/d Total Fluid Estimated Needs: 1.4-1.6L/day  Current Nutrition:  Soft diet + nutritional supplements, not meeting PO needs yet   Plan: Transitioned to *Cyclic* TPN on [id number].   to run over 16 hours per MD request. [name] at 2000 per discussion with dietician to allow patient time for walking in afternoon/evenings.  To run over 16 hours: -Start rate at 49 mL/hr for 1 hour. -Increase rate to 98 mL/hr for 13 hours.  -Decrease rate to 49 mL/hr for 1 hour. -Decrease rate to 25 mL/hr for 1 hour, then stop.  Plan:   See cyclic TPN rate above Continue Nutritional Components Amino acids (using 15% Clinisol): 101 g Dextrose 19% = 266 g Lipids (using 20% SMOFlipids): 50.4 g kCal: 1817/24h  Electrolytes in TPN: Na 70 mEq/L, K 39mEq/L, Ca 55mEq/L, Mg 10 mEq/L, Phos 10 mmol/L  Continue ratio Cl:Ac 1:1  Continue trace elements, MVI, chromium, zinc and selenium per dietary recs Continue CBG/SSI for Cyclic TPN:  -CBG 2 hrs after cyclic TPN start -CBG during middle of cyclic TPN infusion -CBG 1 hr after cylic TPN stopped -CBG while off TPN Continue SSI insulin at moderate scale and 12 units of insulin in TPN Check TPN labs on Mondays/once weekly ISO stable labs   Glean Salvo, PharmD, BCPS Clinical Pharmacist  12/05/2022 7:18 AM

## 2022-12-05 NOTE — Progress Notes (Signed)
[facility] (cpt [phone])  Hospital Day(s): 295.   Post op day(s): 293 Days Post-Op.   Interval History:  Patient seen and examined No issues overnight No new complaints this morning; resting well  No new labs currently - weekly labs (typically Monday) [name] output 200 ccs + unmeasured; managing effluent independently On TPN; On Soft diet She is ambulating better; reviewed mobility notes   Review of Systems:  Constitutional: denies fever, chills  HEENT: denies cough or congestion  Respiratory: denies any shortness of breath  Cardiovascular: denies chest pain or palpitations  Gastrointestinal: denied abdominal pain, denies N/V Genitourinary: denies burning with urination or urinary frequency Integumentary: + midline wound (stable)   Vital signs in last 24 hours: [min-max] current  Temp:  [98.1 F (36.7 C)-98.4 F (36.9 C)] 98.4 F (36.9 C) (03/29 0444) Pulse Rate:  [78-81] 78 (03/29 0444) Resp:  [16-20] 20 (03/29 0444) BP: (103-109)/(52-61) 103/61 (03/29 0444) SpO2:  [98 %-100 %] 98 % (03/29 0444) Weight:  [98.7 kg] 98.7 kg (03/29 0500)     Height: 4\' 11"  (149.9 cm) Weight: 98.7 kg BMI (Calculated): 43.04   Intake/Output last 2 shifts:  03/28 0701 - 03/29 0700 In: 1692.8 [P.O.:600; I.V.:1092.8] Out: 200 [Drains:200]   Physical Exam:  Constitutional: alert, cooperative and no distress  Respiratory: breathing non-labored at rest  Cardiovascular: regular rate and sinus rhythm  Gastrointestinal: Soft, non-tender, non-distended, no rebound/guarding. Integumentary: Midline wound open, peritoneum closed; granulating and new skin growing over majority of wound now; there are three stomatized areas visible in the LUQ and LLQ portions of the wound. No active bleeding.   Labs:     Latest Ref Rng & Units 12/01/2022    6:25 AM 11/25/2022    9:00 AM 11/24/2022    4:39 AM  CBC  WBC 4.0 - 10.5 K/uL 2.9   3.4   Hemoglobin 12.0 - 15.0 g/dL 8.1   8.6  7.2   Hematocrit 36.0 - 46.0 % 25.1  27.9  23.0   Platelets 150 - 400 K/uL 73   104       Latest Ref Rng & Units 12/01/2022    6:25 AM 11/24/2022    4:39 AM 11/20/2022   10:07 AM  CMP  Glucose 70 - 99 mg/dL 159  175  124   BUN 6 - 20 mg/dL 25  24  22    Creatinine 0.44 - 1.00 mg/dL 0.39  0.44  0.46   Sodium 135 - 145 mmol/L 132  132  134   Potassium 3.5 - 5.1 mmol/L 4.2  4.4  4.5   Chloride 98 - 111 mmol/L 104  103  105   CO2 22 - 32 mmol/L 23  24  24    Calcium 8.9 - 10.3 mg/dL 8.3  7.7  8.3   Total Protein 6.5 - 8.1 g/dL 5.7  6.1    Total Bilirubin 0.3 - 1.2 mg/dL 0.6  0.4    Alkaline Phos 38 - 126 U/L 122  118    AST 15 - 41 U/L 34  32    ALT 0 - 44 U/L 29  25      Imaging studies: No new pertinent imaging studies this AM   Assessment/Plan:  57 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  - New 03/29: No new issues this morning; no bleeding  - Wound Care ([name] Pouch); Appreciate WOC RN assistance. No longer on intermittent or continuous suction. Patient seems to like this better and has started to manage effluent independently. She will need intermittent suction with Yankauer to control effluent. Likely be beneficial to suction before ambulation.    - Continue soft diet as tolerated  - Continue cyclic TPN; weekly nutritional labs - Appreciate dietary assistance             - Monitor abdominal examination; on-going bowel function            - Pain control prn; antemetic prn            - Progressed with therapies; no longer any recommendations; ambulating well    - Appreciate neurosurgery assistance with compression fracture; serial XRs   - Discharge Planning: Continue to anticipate lengthy admission +/-  possible transfer; no new progress regarding disposition unfortunately   All of the above findings and recommendations were discussed with the patient, and the medical team, and all of patient's questions were answered to her expressed satisfaction.  -- [name], PA-C [facility] 12/05/2022, 7:39 AM M-F: 7am - 4pm

## 2022-12-06 MED ORDER — ZINC CHLORIDE 1 MG/ML IV SOLN
INTRAVENOUS | Status: AC
  Filled 2022-12-06: qty 672

## 2022-12-06 NOTE — Plan of Care (Signed)
  Problem: Nutrition: Goal: Adequate nutrition will be maintained Outcome: Progressing   Problem: Activity: Goal: Risk for activity intolerance will decrease Outcome: Progressing   

## 2022-12-06 NOTE — Progress Notes (Signed)
[name] PROGRESS NOTE (cpt [phone])  Hospital Day(s): 296.   Post op day(s): 294 Days Post-Op.   Interval History:  Patient seen and examined, has a mango on her tray.  No issues overnight No new complaints this morning; resting well  No new labs currently - weekly labs (typically Monday) [name] unmeasured; managing effluent independently On cycling TPN; On Soft diet She is ambulating better; reviewed mobility notes   Review of Systems:  Constitutional: denies fever, chills  HEENT: denies cough or congestion  Respiratory: denies any shortness of breath  Cardiovascular: denies chest pain or palpitations  Gastrointestinal: denied abdominal pain, denies N/V Genitourinary: denies burning with urination or urinary frequency Integumentary: + midline wound (stable)   Vital signs in last 24 hours: [min-max] current  Temp:  [97.6 F (36.4 C)-98.9 F (37.2 C)] 98.9 F (37.2 C) (03/30 0515) Pulse Rate:  [70-87] 87 (03/30 0515) Resp:  [16-20] 20 (03/30 0515) BP: (93-112)/(48-66) 110/53 (03/30 0515) SpO2:  [98 %-100 %] 100 % (03/30 0515) Weight:  [99.1 kg] 99.1 kg (03/30 0515)     Height: 4\' 11"  (149.9 cm) Weight: 99.1 kg BMI (Calculated): 43.04   Intake/Output last 2 shifts:  03/29 0701 - 03/30 0700 In: 1028.6 [I.V.:1028.6] Out: 30 [Stool:30]   Physical Exam:  Constitutional: alert, cooperative and no distress  Respiratory: breathing non-labored at rest  Cardiovascular: regular rate and sinus rhythm  Gastrointestinal: Soft, non-tender, non-distended, no rebound/guarding. Integumentary: Midline wound open, peritoneum closed; granulating and new skin growing over majority of wound now; there are three stomatized areas visible in the LUQ and LLQ portions of the wound. No active bleeding.   Labs:     Latest Ref Rng & Units 12/01/2022    6:25 AM 11/25/2022    9:00 AM 11/24/2022    4:39 AM  CBC  WBC 4.0 - 10.5 K/uL 2.9   3.4   Hemoglobin 12.0 - 15.0  g/dL 8.1  8.6  7.2   Hematocrit 36.0 - 46.0 % 25.1  27.9  23.0   Platelets 150 - 400 K/uL 73   104       Latest Ref Rng & Units 12/01/2022    6:25 AM 11/24/2022    4:39 AM 11/20/2022   10:07 AM  CMP  Glucose 70 - 99 mg/dL 159  175  124   BUN 6 - 20 mg/dL 25  24  22    Creatinine 0.44 - 1.00 mg/dL 0.39  0.44  0.46   Sodium 135 - 145 mmol/L 132  132  134   Potassium 3.5 - 5.1 mmol/L 4.2  4.4  4.5   Chloride 98 - 111 mmol/L 104  103  105   CO2 22 - 32 mmol/L 23  24  24    Calcium 8.9 - 10.3 mg/dL 8.3  7.7  8.3   Total Protein 6.5 - 8.1 g/dL 5.7  6.1    Total Bilirubin 0.3 - 1.2 mg/dL 0.6  0.4    Alkaline Phos 38 - 126 U/L 122  118    AST 15 - 41 U/L 34  32    ALT 0 - 44 U/L 29  25      Imaging studies: No new pertinent imaging studies this AM   Assessment/Plan:  57 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  - New 03/30: No new issues this morning; no bleeding  - Wound Care ([name] Pouch); Appreciate WOC RN assistance. No longer on intermittent or continuous suction. Patient seems to like this better and has started to manage effluent independently. She will need intermittent suction with Yankauer to control effluent. Likely be beneficial to suction before ambulation.    - Continue soft diet as tolerated  - Continue cyclic TPN; weekly nutritional labs - Appreciate dietary assistance             - Monitor abdominal examination; on-going bowel function            - Pain control prn; antemetic prn            - Progressed with therapies; no longer any recommendations; ambulating well    - Appreciate neurosurgery assistance with compression fracture; serial XRs   - Discharge Planning: Continue to anticipate lengthy  admission +/- possible transfer; no new progress regarding disposition unfortunately

## 2022-12-06 NOTE — Progress Notes (Signed)
FSBS 148, did not flow over

## 2022-12-06 NOTE — Progress Notes (Signed)
PHARMACY - TOTAL PARENTERAL NUTRITION CONSULT NOTE   Indication: Prolonged ileus  Patient Measurements: Height: 4\' 11"  (149.9 cm) Weight: 99.1 kg (218 lb 7.6 oz) IBW/kg (Calculated) : 43.2 TPN AdjBW (KG): 55.2 Body mass index is 44.13 kg/m.  Assessment: Debra [name] is a 57 y.o. female s/p laparotomy, excision of greater omental mass, abdominal wall reconstruction with [name], appendectomy, and placement of Prevena vac.  Glucose / Insulin:  --No apparent history of diabetes --BG controlled on current regimen.  --rSSI 4x/day (0500, 1300, 1800, 2200) --SSI last 24h: 6 units  --BG/24h: 83-134 --12 units insulin in TPN Electrolytes: Within normal limits Renal: SCr stable at baseline  Hepatic: Liver function overall within normal limits GI Imaging: 9/11 CTAP: no new acute issues GI Surgeries / Procedures: s/p laparotomy, excision of greater omental mass, abdominal wall reconstruction with [name], appendectomy, and placement of Prevena vac  Central access: 02/15/22 TPN start date: 02/15/22  Nutritional Goals: Goal cyclic TPN over 16 hrs: cyclic TPN over 16 hrs: (provides 102 g of protein and 1811 kcals per day) total volume over 16hrs for calculations=1400 ml (1500 ml total with overfill)  RD Assessment:  Estimated Needs Total Energy Estimated Needs: 1800-2100kcal/day Total Protein Estimated Needs: 90-110g/d Total Fluid Estimated Needs: 1.4-1.6L/day  Current Nutrition:  Soft diet + nutritional supplements, not meeting PO needs yet   Plan: Transitioned to *Cyclic* TPN on [id number].   to run over 16 hours per MD request. [name] at 2000 per discussion with dietician to allow patient time for walking in afternoon/evenings.  To run over 16 hours: -Start rate at 49 mL/hr for 1 hour. -Increase rate to 98 mL/hr for 13 hours.  -Decrease rate to 49 mL/hr for 1 hour. -Decrease rate to 25 mL/hr for 1 hour, then stop.  Plan:  See cyclic TPN rate  above Continue Nutritional Components Amino acids (using 15% Clinisol): 101 g Dextrose 19% = 266 g Lipids (using 20% SMOFlipids): 50.4 g kCal: 1817/24h  Electrolytes in TPN: Na 70 mEq/L, K 46mEq/L, Ca 74mEq/L, Mg 10 mEq/L, Phos 10 mmol/L  Continue ratio Cl:Ac 1:1  Continue trace elements, MVI, chromium, zinc and selenium per dietary recs Continue CBG/SSI for Cyclic TPN:  -CBG 2 hrs after cyclic TPN start -CBG during middle of cyclic TPN infusion -CBG 1 hr after cylic TPN stopped -CBG while off TPN Continue SSI insulin at moderate scale and 12 units of insulin in TPN Check TPN labs on Mondays/once weekly ISO stable labs   Chinita Greenland PharmD Clinical Pharmacist 12/06/2022

## 2022-12-07 LAB — GLUCOSE, CAPILLARY
Glucose-Capillary: 125 mg/dL — ABNORMAL HIGH (ref 70–99)
Glucose-Capillary: 99 mg/dL (ref 70–99)

## 2022-12-07 MED ORDER — ZINC CHLORIDE 1 MG/ML IV SOLN
INTRAVENOUS | Status: AC
  Filled 2022-12-07: qty 672

## 2022-12-07 NOTE — Progress Notes (Signed)
A consult was placed to the IV Nurse to evaluate pt's arm as it is "sore under the picc";  MD at bedside at 1140; RN in the room;  will return to assess.

## 2022-12-07 NOTE — Progress Notes (Signed)
[facility] PROGRESS NOTE (cpt [phone])  Hospital Day(s): 297.   Post op day(s): 295 Days Post-Op.   Interval History:  Patient seen and examined,  No issues overnight, 75 mL of bloody output in pouch this AM.  No new complaints this morning; resting well  No new labs currently - weekly labs (typically Monday) [name] unmeasured; managing effluent independently, small leak this AM and pending change.  On cycling TPN; On Soft diet She is ambulating better; reviewed mobility notes   Review of Systems:  Constitutional: denies fever, chills  HEENT: denies cough or congestion  Respiratory: denies any shortness of breath  Cardiovascular: denies chest pain or palpitations  Gastrointestinal: denied abdominal pain, denies N/V Genitourinary: denies burning with urination or urinary frequency Integumentary: + midline wound (stable)   Vital signs in last 24 hours: [min-max] current  Temp:  [98.2 F (36.8 C)-98.3 F (36.8 C)] 98.3 F (36.8 C) (03/31 0404) Pulse Rate:  [74-77] 77 (03/31 0404) Resp:  [16] 16 (03/31 0404) BP: (95-104)/(57-68) 95/57 (03/31 0404) SpO2:  [98 %-100 %] 98 % (03/31 0404) Weight:  [99.5 kg] 99.5 kg (03/31 0404)     Height: 4\' 11"  (149.9 cm) Weight: 99.5 kg BMI (Calculated): 43.04   Intake/Output last 2 shifts:  03/30 0701 - 03/31 0700 In: 664.5 [P.O.:120; I.V.:544.5] Out: 150 [Stool:150]   Physical Exam:  Constitutional: alert, cooperative and no distress  Respiratory: breathing non-labored at rest  Cardiovascular: regular rate and sinus rhythm  Gastrointestinal: Soft, non-tender, non-distended, no rebound/guarding. Integumentary: Midline wound open, peritoneum closed; granulating and new skin growing over majority of wound now; there are three stomatized areas visible in the LUQ and LLQ portions of the wound. No active bleeding.  No blood in pouch.   No site identified thru pouch.    Labs:     Latest Ref Rng & Units 12/01/2022     6:25 AM 11/25/2022    9:00 AM 11/24/2022    4:39 AM  CBC  WBC 4.0 - 10.5 K/uL 2.9   3.4   Hemoglobin 12.0 - 15.0 g/dL 8.1  8.6  7.2   Hematocrit 36.0 - 46.0 % 25.1  27.9  23.0   Platelets 150 - 400 K/uL 73   104       Latest Ref Rng & Units 12/01/2022    6:25 AM 11/24/2022    4:39 AM 11/20/2022   10:07 AM  CMP  Glucose 70 - 99 mg/dL 159  175  124   BUN 6 - 20 mg/dL 25  24  22    Creatinine 0.44 - 1.00 mg/dL 0.39  0.44  0.46   Sodium 135 - 145 mmol/L 132  132  134   Potassium 3.5 - 5.1 mmol/L 4.2  4.4  4.5   Chloride 98 - 111 mmol/L 104  103  105   CO2 22 - 32 mmol/L 23  24  24    Calcium 8.9 - 10.3 mg/dL 8.3  7.7  8.3   Total Protein 6.5 - 8.1 g/dL 5.7  6.1    Total Bilirubin 0.3 - 1.2 mg/dL 0.6  0.4    Alkaline Phos 38 - 126 U/L 122  118    AST 15 - 41 U/L 34  32    ALT 0 - 44 U/L 29  25      Imaging studies: No new pertinent imaging studies this AM   Assessment/Plan:  57 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  - New 03/30: Some blood in pouch, mixed with succus, this morning; no identifiable bleeding  - Wound Care ([name] Pouch); Appreciate WOC RN assistance. No longer on intermittent or continuous suction. Patient seems to like this better and has started to manage effluent independently. She will need intermittent suction with Yankauer to control effluent. Likely be beneficial to suction before ambulation.    - Continue soft diet as tolerated  - Continue cyclic TPN; weekly nutritional labs - Appreciate dietary assistance             - Monitor abdominal examination; on-going bowel function            - Pain control prn; antemetic prn            - Progressed with therapies; no longer any recommendations;  ambulating well    - Appreciate neurosurgery assistance with compression fracture; serial XRs   - Discharge Planning: Continue to anticipate lengthy admission +/- possible transfer; no new progress regarding disposition unfortunately

## 2022-12-07 NOTE — Progress Notes (Signed)
MD notified and aware of pain around PICC line and underneath arm. PICC dressing also changed

## 2022-12-07 NOTE — Progress Notes (Signed)
A consult was placed to IV Therapy to assess the pt's picc line, which was placed 09-16-22;  pt points all around the picc dressing and underneath her left upper arm and nods "yes" when asked if it's hurting.  Both hands and arms are edematous, so it's difficult to assess;  both ports have great blood returns and flush easily w NS;  recommend a Doppler study to r/o a clot.

## 2022-12-07 NOTE — Progress Notes (Signed)
   12/07/22 1300  Spiritual Encounters  Type of Visit Follow up  Care provided to: Patient  Referral source Other (comment)  Reason for visit Routine spiritual support  OnCall Visit Yes  Spiritual Needs Emotional  Spiritual Framework  Presenting Themes Courage hope and growth   Chaplain visited patient who has been in hospital nearly 300 days. Chaplain provides conversation in [place].

## 2022-12-07 NOTE — Progress Notes (Signed)
PHARMACY - TOTAL PARENTERAL NUTRITION CONSULT NOTE   Indication: Prolonged ileus  Patient Measurements: Height: 4\' 11"  (149.9 cm) Weight: 99.5 kg (219 lb 5.7 oz) IBW/kg (Calculated) : 43.2 TPN AdjBW (KG): 55.2 Body mass index is 44.3 kg/m.  Assessment: Debra [name] is a 57 y.o. female s/p laparotomy, excision of greater omental mass, abdominal wall reconstruction with [name], appendectomy, and placement of Prevena vac.  Glucose / Insulin:  --No apparent history of diabetes --BG controlled on current regimen.  --rSSI 4x/day (0500, 1300, 1800, 2200) --SSI last 24h: 2 units  --BG/24h: not charted --12 units insulin in TPN Electrolytes: Within normal limits Renal: SCr stable at baseline  Hepatic: Liver function overall within normal limits GI Imaging: 9/11 CTAP: no new acute issues GI Surgeries / Procedures: s/p laparotomy, excision of greater omental mass, abdominal wall reconstruction with [name], appendectomy, and placement of Prevena vac  Central access: 02/15/22 TPN start date: 02/15/22  Nutritional Goals: Goal cyclic TPN over 16 hrs: cyclic TPN over 16 hrs: (provides 102 g of protein and 1811 kcals per day) total volume over 16hrs for calculations=1400 ml (1500 ml total with overfill)  RD Assessment:  Estimated Needs Total Energy Estimated Needs: 1800-2100kcal/day Total Protein Estimated Needs: 90-110g/d Total Fluid Estimated Needs: 1.4-1.6L/day  Current Nutrition:  Soft diet + nutritional supplements, not meeting PO needs yet   Plan: Transitioned to *Cyclic* TPN on [id number].   to run over 16 hours per MD request. [name] at 2000 per discussion with dietician to allow patient time for walking in afternoon/evenings.  To run over 16 hours: -Start rate at 49 mL/hr for 1 hour. -Increase rate to 98 mL/hr for 13 hours.  -Decrease rate to 49 mL/hr for 1 hour. -Decrease rate to 25 mL/hr for 1 hour, then stop.  Plan:  See cyclic TPN rate  above Continue Nutritional Components Amino acids (using 15% Clinisol): 101 g Dextrose 19% = 266 g Lipids (using 20% SMOFlipids): 50.4 g kCal: 1817/24h  Electrolytes in TPN: Na 70 mEq/L, K 44mEq/L, Ca 79mEq/L, Mg 10 mEq/L, Phos 10 mmol/L  Continue ratio Cl:Ac 1:1  Continue trace elements, MVI, chromium, zinc and selenium per dietary recs Continue CBG/SSI for Cyclic TPN:  -CBG 2 hrs after cyclic TPN start -CBG during middle of cyclic TPN infusion -CBG 1 hr after cylic TPN stopped -CBG while off TPN Continue SSI insulin at moderate scale and 12 units of insulin in TPN Check TPN labs on Mondays/once weekly ISO stable labs   Chinita Greenland PharmD Clinical Pharmacist 12/07/2022

## 2022-12-08 LAB — CBC
HCT: 24.2 % — ABNORMAL LOW (ref 36.0–46.0)
Hemoglobin: 7.6 g/dL — ABNORMAL LOW (ref 12.0–15.0)
MCH: 28.5 pg (ref 26.0–34.0)
MCHC: 31.4 g/dL (ref 30.0–36.0)
MCV: 90.6 fL (ref 80.0–100.0)
Platelets: 100 10*3/uL — ABNORMAL LOW (ref 150–400)
RBC: 2.67 MIL/uL — ABNORMAL LOW (ref 3.87–5.11)
RDW: 15.5 % (ref 11.5–15.5)
WBC: 2.8 10*3/uL — ABNORMAL LOW (ref 4.0–10.5)
nRBC: 0 % (ref 0.0–0.2)

## 2022-12-08 LAB — COMPREHENSIVE METABOLIC PANEL
ALT: 28 U/L (ref 0–44)
AST: 35 U/L (ref 15–41)
Albumin: 2.6 g/dL — ABNORMAL LOW (ref 3.5–5.0)
Alkaline Phosphatase: 118 U/L (ref 38–126)
Anion gap: 3 — ABNORMAL LOW (ref 5–15)
BUN: 22 mg/dL — ABNORMAL HIGH (ref 6–20)
CO2: 24 mmol/L (ref 22–32)
Calcium: 8.1 mg/dL — ABNORMAL LOW (ref 8.9–10.3)
Chloride: 107 mmol/L (ref 98–111)
Creatinine, Ser: 0.48 mg/dL (ref 0.44–1.00)
GFR, Estimated: >60 mL/min (ref >60)
Glucose, Bld: 168 mg/dL — ABNORMAL HIGH (ref 70–99)
Potassium: 4 mmol/L (ref 3.5–5.1)
Sodium: 134 mmol/L — ABNORMAL LOW (ref 135–145)
Total Bilirubin: 0.5 mg/dL (ref 0.3–1.2)
Total Protein: 5.8 g/dL — ABNORMAL LOW (ref 6.5–8.1)

## 2022-12-08 LAB — PROTIME-INR
INR: 1.2 (ref 0.8–1.2)
Prothrombin Time: 15.5 seconds — ABNORMAL HIGH (ref 11.4–15.2)

## 2022-12-08 LAB — PREALBUMIN: Prealbumin: 11 mg/dL — ABNORMAL LOW (ref 18–38)

## 2022-12-08 LAB — GLUCOSE, CAPILLARY
Glucose-Capillary: 148 mg/dL — ABNORMAL HIGH (ref 70–99)
Glucose-Capillary: 115 mg/dL — ABNORMAL HIGH (ref 70–99)
Glucose-Capillary: 118 mg/dL — ABNORMAL HIGH (ref 70–99)
Glucose-Capillary: 99 mg/dL (ref 70–99)
Glucose-Capillary: 144 mg/dL — ABNORMAL HIGH (ref 70–99)
Glucose-Capillary: 116 mg/dL — ABNORMAL HIGH (ref 70–99)
Glucose-Capillary: 154 mg/dL — ABNORMAL HIGH (ref 70–99)
Glucose-Capillary: 168 mg/dL — ABNORMAL HIGH (ref 70–99)
Glucose-Capillary: 78 mg/dL (ref 70–99)
Glucose-Capillary: 161 mg/dL — ABNORMAL HIGH (ref 70–99)

## 2022-12-08 LAB — MAGNESIUM: Magnesium: 2 mg/dL (ref 1.7–2.4)

## 2022-12-08 LAB — PHOSPHORUS: Phosphorus: 3.3 mg/dL (ref 2.5–4.6)

## 2022-12-08 MED ORDER — ZINC CHLORIDE 1 MG/ML IV SOLN
INTRAVENOUS | Status: AC
  Filled 2022-12-08: qty 672

## 2022-12-08 NOTE — Progress Notes (Addendum)
[facility] SURGICAL PROGRESS NOTE (cpt [phone])  Hospital Day(s): 298.   Post op day(s): 296 Days Post-Op.   Interval History:  Patient seen and examined Did have small amount of oozing over weekend; no source; clots this AM Also with pain near PICC: pending [name] to rule out DVT No new complaints this morning; Overall nutritional labs reassuring Hgb 7.6 - relatively stable [name] output 1075 ccs + unmeasured; managing effluent independently On TPN; On Soft diet She is ambulating better; reviewed mobility notes   Review of Systems:  Constitutional: denies fever, chills  HEENT: denies cough or congestion  Respiratory: denies any shortness of breath  Cardiovascular: denies chest pain or palpitations  Gastrointestinal: denied abdominal pain, denies N/V Genitourinary: denies burning with urination or urinary frequency Integumentary: + midline wound (stable)   Vital signs in last 24 hours: [min-max] current  Temp:  [98.2 F (36.8 C)-99.1 F (37.3 C)] 99.1 F (37.3 C) (04/01 0756) Pulse Rate:  [73-83] 73 (04/01 0756) Resp:  [16-20] 20 (04/01 0756) BP: (99-108)/(54-72) 101/54 (04/01 0756) SpO2:  [97 %-100 %] 97 % (04/01 0756) Weight:  [99.2 kg] 99.2 kg (04/01 0320)     Height: 4\' 11"  (149.9 cm) Weight: 99.2 kg BMI (Calculated): 43.04   Intake/Output last 2 shifts:  03/31 0701 - 04/01 0700 In: 986.4 [I.V.:986.4] Out: 1075 [Stool:1075]   Physical Exam:  Constitutional: alert, cooperative and no distress  Respiratory: breathing non-labored at rest  Cardiovascular: regular rate and sinus rhythm  Gastrointestinal: Soft, non-tender, non-distended, no rebound/guarding. Integumentary: Midline wound open, peritoneum closed; granulating and new skin growing over majority of wound now; there are three stomatized areas visible in the LUQ and LLQ portions of the wound. No active bleeding.   Labs:     Latest Ref Rng & Units 12/08/2022    3:35 AM 12/01/2022    6:25 AM  11/25/2022    9:00 AM  CBC  WBC 4.0 - 10.5 K/uL 2.8  2.9    Hemoglobin 12.0 - 15.0 g/dL 7.6  8.1  8.6   Hematocrit 36.0 - 46.0 % 24.2  25.1  27.9   Platelets 150 - 400 K/uL 100  73        Latest Ref Rng & Units 12/08/2022    3:35 AM 12/01/2022    6:25 AM 11/24/2022    4:39 AM  CMP  Glucose 70 - 99 mg/dL 168  159  175   BUN 6 - 20 mg/dL 22  25  24    Creatinine 0.44 - 1.00 mg/dL 0.48  0.39  0.44   Sodium 135 - 145 mmol/L 134  132  132   Potassium 3.5 - 5.1 mmol/L 4.0  4.2  4.4   Chloride 98 - 111 mmol/L 107  104  103   CO2 22 - 32 mmol/L 24  23  24    Calcium 8.9 - 10.3 mg/dL 8.1  8.3  7.7   Total Protein 6.5 - 8.1 g/dL 5.8  5.7  6.1   Total Bilirubin 0.3 - 1.2 mg/dL 0.5  0.6  0.4   Alkaline Phos 38 - 126 U/L 118  122  118   AST 15 - 41 U/L 35  34  32   ALT 0 - 44 U/L 28  29  25      Imaging studies: No new pertinent imaging studies this AM   Assessment/Plan:  57 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  - New 04/01: RN did pouch change this morning; large clots and oozing from left side; stopped with pressure; monitor. May need arista or surgicel if recurs. Pending [name] to rule out DVT with PICC. Labs reassuring; Hgb 7.6 - will repeat tomorrow given bleeding  - Wound Care ([name] Pouch); Appreciate WOC RN assistance. No longer on intermittent or continuous suction. Patient seems to like this better and has started to manage effluent independently. She will need intermittent suction with Yankauer to control effluent. Likely be beneficial to suction before ambulation.    - Continue soft diet as tolerated  - Continue cyclic TPN; weekly nutritional labs - Appreciate dietary assistance             - Monitor abdominal  examination; on-going bowel function            - Pain control prn; antemetic prn            - Progressed with therapies; no longer any recommendations; ambulating well    - Appreciate neurosurgery assistance with compression fracture; serial XRs   - Discharge Planning: Continue to anticipate lengthy admission +/- possible transfer; no new progress regarding disposition unfortunately   All of the above findings and recommendations were discussed with the patient, and the medical team, and all of patient's questions were answered to her expressed satisfaction.  -- Debra Simon, PA-C [facility] 12/08/2022, 8:22 AM M-F: 7am - 4pm

## 2022-12-08 NOTE — Progress Notes (Signed)
PHARMACY - TOTAL PARENTERAL NUTRITION CONSULT NOTE   Indication: Prolonged ileus  Patient Measurements: Height: 4\' 11"  (149.9 cm) Weight: 99.2 kg (218 lb 11.1 oz) IBW/kg (Calculated) : 43.2 TPN AdjBW (KG): 55.2 Body mass index is 44.17 kg/m.  Assessment: Debra [name] is a 57 y.o. female s/p laparotomy, excision of greater omental mass, abdominal wall reconstruction with [name], appendectomy, and placement of Prevena vac.  Glucose / Insulin:  --No apparent history of diabetes --BG controlled on current regimen.  --rSSI 4x/day (0500, 1300, 1800, 2200) --SSI last 24h: 4 units  --BG/24h: 99 - 144 --12 units insulin in TPN Electrolytes: Mild hyponatremia Renal: SCr stable at baseline  Hepatic: Liver function overall within normal limits GI Imaging: 9/11 CTAP: no new acute issues GI Surgeries / Procedures: s/p laparotomy, excision of greater omental mass, abdominal wall reconstruction with [name], appendectomy, and placement of Prevena vac  Central access: 02/15/22 TPN start date: 02/15/22  Nutritional Goals: Goal cyclic TPN over 16 hrs: cyclic TPN over 16 hrs: (provides 102 g of protein and 1811 kcals per day) total volume over 16hrs for calculations=1400 ml (1500 ml total with overfill)  RD Assessment:  Estimated Needs Total Energy Estimated Needs: 1800-2100kcal/day Total Protein Estimated Needs: 90-110g/d Total Fluid Estimated Needs: 1.4-1.6L/day  Current Nutrition:  Soft diet + nutritional supplements, not meeting PO needs yet   Plan: Transitioned to *Cyclic* TPN on [id number].   to run over 16 hours per MD request. [name] at 2000 per discussion with dietician to allow patient time for walking in afternoon/evenings.  To run over 16 hours: -Start rate at 49 mL/hr for 1 hour. -Increase rate to 98 mL/hr for 13 hours.  -Decrease rate to 49 mL/hr for 1 hour. -Decrease rate to 25 mL/hr for 1 hour, then stop.  Plan:  See cyclic TPN rate  above Nutritional Components Amino acids (using 15% Clinisol): 101 g Dextrose 19% = 266 g Lipids (using 20% SMOFlipids): 50.4 g kCal: 1817/24h  Electrolytes in TPN: Na 70 mEq/L, K 75mEq/L, Ca 7mEq/L, Mg 10 mEq/L, Phos 10 mmol/L  Continue ratio Cl:Ac 1:1  Continue trace elements, MVI, chromium, zinc and selenium per dietary recs Continue CBG/SSI for Cyclic TPN:  -CBG 2 hrs after cyclic TPN start -CBG during middle of cyclic TPN infusion -CBG 1 hr after cylic TPN stopped -CBG while off TPN Continue SSI insulin at moderate scale and 12 units of insulin in TPN Check TPN labs on Mondays/once weekly ISO stable labs   Benita Gutter 12/08/2022

## 2022-12-09 LAB — CBC
HCT: 23.9 % — ABNORMAL LOW (ref 36.0–46.0)
Hemoglobin: 7.6 g/dL — ABNORMAL LOW (ref 12.0–15.0)
MCH: 28.8 pg (ref 26.0–34.0)
MCHC: 31.8 g/dL (ref 30.0–36.0)
MCV: 90.5 fL (ref 80.0–100.0)
Platelets: 100 10*3/uL — ABNORMAL LOW (ref 150–400)
RBC: 2.64 MIL/uL — ABNORMAL LOW (ref 3.87–5.11)
RDW: 15.3 % (ref 11.5–15.5)
WBC: 3 10*3/uL — ABNORMAL LOW (ref 4.0–10.5)
nRBC: 1 % — ABNORMAL HIGH (ref 0.0–0.2)

## 2022-12-09 LAB — GLUCOSE, CAPILLARY
Glucose-Capillary: 182 mg/dL — ABNORMAL HIGH (ref 70–99)
Glucose-Capillary: 160 mg/dL — ABNORMAL HIGH (ref 70–99)
Glucose-Capillary: 135 mg/dL — ABNORMAL HIGH (ref 70–99)
Glucose-Capillary: 126 mg/dL — ABNORMAL HIGH (ref 70–99)
Glucose-Capillary: 53 mg/dL — ABNORMAL LOW (ref 70–99)

## 2022-12-09 MED ORDER — ZINC CHLORIDE 1 MG/ML IV SOLN
INTRAVENOUS | Status: AC
  Filled 2022-12-09: qty 672

## 2022-12-09 NOTE — Progress Notes (Signed)
[facility] SURGICAL PROGRESS NOTE (cpt [phone])  Hospital Day(s): 299.   Post op day(s): 297 Days Post-Op.   Interval History:  Patient seen and examined No further bleeding  No new complaints this morning; Hgb 7.6 -  stable [name] output 525 ccs + unmeasured; managing effluent independently On TPN; On Soft diet She is ambulating better; reviewed mobility notes   Review of Systems:  Constitutional: denies fever, chills  HEENT: denies cough or congestion  Respiratory: denies any shortness of breath  Cardiovascular: denies chest pain or palpitations  Gastrointestinal: denied abdominal pain, denies N/V Genitourinary: denies burning with urination or urinary frequency Integumentary: + midline wound (stable)   Vital signs in last 24 hours: [min-max] current  Temp:  [98.1 F (36.7 C)-99.1 F (37.3 C)] 98.1 F (36.7 C) (04/02 0508) Pulse Rate:  [73-89] 89 (04/02 0508) Resp:  [16-20] 16 (04/02 0508) BP: (92-131)/(51-72) 92/51 (04/02 0508) SpO2:  [97 %-99 %] 98 % (04/02 0508) Weight:  [99.5 kg] 99.5 kg (04/02 0500)     Height: 4\' 11"  (149.9 cm) Weight: 99.5 kg BMI (Calculated): 43.04   Intake/Output last 2 shifts:  04/01 0701 - 04/02 0700 In: -  Out: 525 [Drains:525]   Physical Exam:  Constitutional: alert, cooperative and no distress  Respiratory: breathing non-labored at rest  Cardiovascular: regular rate and sinus rhythm  Gastrointestinal: Soft, non-tender, non-distended, no rebound/guarding. Integumentary: Midline wound open, peritoneum closed; granulating and new skin growing over majority of wound now; there are three stomatized areas visible in the LUQ and LLQ portions of the wound. No active bleeding.   Labs:     Latest Ref Rng & Units 12/09/2022    5:16 AM 12/08/2022    3:35 AM 12/01/2022    6:25 AM  CBC  WBC 4.0 - 10.5 K/uL 3.0  2.8  2.9   Hemoglobin 12.0 - 15.0 g/dL 7.6  7.6  8.1   Hematocrit 36.0 - 46.0 % 23.9  24.2  25.1   Platelets 150 - 400  K/uL 100  100  73       Latest Ref Rng & Units 12/08/2022    3:35 AM 12/01/2022    6:25 AM 11/24/2022    4:39 AM  CMP  Glucose 70 - 99 mg/dL 168  159  175   BUN 6 - 20 mg/dL 22  25  24    Creatinine 0.44 - 1.00 mg/dL 0.48  0.39  0.44   Sodium 135 - 145 mmol/L 134  132  132   Potassium 3.5 - 5.1 mmol/L 4.0  4.2  4.4   Chloride 98 - 111 mmol/L 107  104  103   CO2 22 - 32 mmol/L 24  23  24    Calcium 8.9 - 10.3 mg/dL 8.1  8.3  7.7   Total Protein 6.5 - 8.1 g/dL 5.8  5.7  6.1   Total Bilirubin 0.3 - 1.2 mg/dL 0.5  0.6  0.4   Alkaline Phos 38 - 126 U/L 118  122  118   AST 15 - 41 U/L 35  34  32   ALT 0 - 44 U/L 28  29  25      Imaging studies: No new pertinent imaging studies this AM   Assessment/Plan:  57 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  - New 04/02: Nothing new  - Wound Care ([name] Pouch); Appreciate WOC RN assistance. No longer on intermittent or continuous suction. Patient seems to like this better and has started to manage effluent independently. She will need intermittent suction with Yankauer to control effluent. Likely be beneficial to suction before ambulation.    - Continue soft diet as tolerated  - Continue cyclic TPN; weekly nutritional labs - Appreciate dietary assistance             - Monitor abdominal examination; on-going bowel function            - Pain control prn; antemetic prn            - Progressed with therapies; no longer any recommendations; ambulating well    - Appreciate neurosurgery assistance with compression fracture; serial XRs   - Discharge Planning: Continue to anticipate lengthy admission +/- possible transfer; no new progress regarding disposition unfortunately   All of the  above findings and recommendations were discussed with the patient, and the medical team, and all of patient's questions were answered to her expressed satisfaction.  -- [name], PA-C [facility] 12/09/2022, 7:20 AM M-F: 7am - 4pm

## 2022-12-09 NOTE — Progress Notes (Signed)
PHARMACY - TOTAL PARENTERAL NUTRITION CONSULT NOTE   Indication: Prolonged ileus  Patient Measurements: Height: 4\' 11"  (149.9 cm) Weight: 99.5 kg (219 lb 5.7 oz) IBW/kg (Calculated) : 43.2 TPN AdjBW (KG): 55.2 Body mass index is 44.3 kg/m.  Assessment: Debra [name] is a 57 y.o. female s/p laparotomy, excision of greater omental mass, abdominal wall reconstruction with [name], appendectomy, and placement of Prevena vac.  Glucose / Insulin:  --No apparent history of diabetes --BG controlled on current regimen.  --rSSI 4x/day (0500, 1300, 1800, 2200) --SSI last 24h: 9 units  --BG/24h: 78 - 168 --12 units insulin in TPN Electrolytes: Mild hyponatremia Renal: SCr stable at baseline  Hepatic: Liver function overall within normal limits GI Imaging: 9/11 CTAP: no new acute issues GI Surgeries / Procedures: s/p laparotomy, excision of greater omental mass, abdominal wall reconstruction with [name], appendectomy, and placement of Prevena vac  Central access: 02/15/22 TPN start date: 02/15/22  Nutritional Goals: Goal cyclic TPN over 16 hrs: cyclic TPN over 16 hrs: (provides 102 g of protein and 1811 kcals per day) total volume over 16hrs for calculations=1400 ml (1500 ml total with overfill)  RD Assessment:  Estimated Needs Total Energy Estimated Needs: 1800-2100kcal/day Total Protein Estimated Needs: 90-110g/d Total Fluid Estimated Needs: 1.4-1.6L/day  Current Nutrition:  Soft diet + nutritional supplements, not meeting PO needs yet   Plan: Transitioned to *Cyclic* TPN on [id number].   to run over 16 hours per MD request. [name] at 2000 per discussion with dietician to allow patient time for walking in afternoon/evenings.  To run over 16 hours: -Start rate at 49 mL/hr for 1 hour. -Increase rate to 98 mL/hr for 13 hours.  -Decrease rate to 49 mL/hr for 1 hour. -Decrease rate to 25 mL/hr for 1 hour, then stop.  Plan:  See cyclic TPN rate  above Nutritional Components Amino acids (using 15% Clinisol): 101 g Dextrose 19% = 266 g Lipids (using 20% SMOFlipids): 50.4 g kCal: 1817/24h  Electrolytes in TPN: Na 70 mEq/L, K 92mEq/L, Ca 45mEq/L, Mg 10 mEq/L, Phos 10 mmol/L  Continue ratio Cl:Ac 1:1  Continue trace elements, MVI, chromium, zinc and selenium per dietary recs Continue CBG/SSI for Cyclic TPN:  -CBG 2 hrs after cyclic TPN start -CBG during middle of cyclic TPN infusion -CBG 1 hr after cylic TPN stopped -CBG while off TPN Continue SSI insulin at moderate scale and 12 units of insulin in TPN Check TPN labs on Mondays  Benita Gutter 12/09/2022

## 2022-12-09 NOTE — Progress Notes (Addendum)
At 2230, pt's BG 53. Hypoglycemia protocol started. Pt given 4oz apple juice, but refuses additional 4oz. States, "I full. Fifteen minutes." Pt educated on protocol and advised to drink the additional 4oz. Pt stated, "fifteen minutes." Neomia Glass, NP notified.  Morton Amy told writer to contact pt's team. Dr. Dahlia Byes contacted at [phone] and informed of hypoglycemic event and pt's current BG of 126. No additional orders obtained.

## 2022-12-09 NOTE — Progress Notes (Signed)
Nutrition Follow-up  DOCUMENTATION CODES:   Obesity unspecified  INTERVENTION:   Continue cyclic TPN per pharmacy (16 hrs)- provides 1811kcal/day and 102g/day protein   Ensure Enlive po BID, each supplement provides 350 kcal and 20 grams of protein.  MVI, chromium and trace elements in TPN   Triglycerides checked monthly   Daily weights   Vitamin D2- 50,000 units po weekly   Zinc additional 5mg  daily added to TPN   Selenium additional 73mcg daily added to TPN   Routine vitamin labs due 4/9- will include carnitine and aluminum.   NUTRITION DIAGNOSIS:   Increased nutrient needs related to wound healing, catabolic illness as evidenced by estimated needs. -ongoing   GOAL:   Patient will meet greater than or equal to 90% of their needs -met with TPN   MONITOR:   PO intake, Supplement acceptance, Labs, Weight trends, Diet advancement, I & O's, TPN  ASSESSMENT:   57 y/o female with h/o hypothyroidism, COVID 19 (3/21), kidney stones and stage 3 colon cancer (s/p left hemicolectomy 5/21 and chemotherapy) who is admitted for new pelvic mass now s/p laparotomy 6/8 (with excision of pelvic mass from greater omentum, abdominal wall reconstruction with bilateral myocutaneous flaps and mesh, incisional hernia repair, appendectomy repair and VAC placement) complicated by bowel perforation s/p reopening of recent laparotomy 6/10 (with repair of small bowel perforation, excision of mesh, placement of two phasix mesh and VAC placement). Pathology returned as metastatic adenocarcinoma. Pt with L1 compression fracture.   Pt continues to tolerate TPN well at goal rate; TPN is currently being cycled for 16 hrs. Triglycerides wnl and are being checked monthly. Hyperglycemia improved with insulin in TPN. Volume status being monitored; pt up ~22lbs since admission. Will check routine vitamin labs every 2-3 months as pt on chronic TPN (next due 4/9). Pt continues to have poor oral intake; pt eating  <25% of meals. Pt on soft diet. [name] pouch with 541ml + unmeasured output. No discharge plan in place.   Medications reviewed and include: D2, insulin, synthroid, protonix, TPN  -Selenium 92(L), zinc 66 wnl- 3/15 -Chromium- 2.4(H), copper- 105 wnl, Manganese- 13.1 wnl, vitamin D 34.2 wnl, Iodine 74.2 wnl- 2/8 -Iron 23(L), TIBC 364, ferritin 17 wnl, transferrin 261 wnl, folate 14.5 wnl, B12 585- 2/8 -Vitamin B1- 75.8, vitamin A- 27.1, B6- 21.9 wnl, vitamin C- 0.7, vitamin E- 11.6 wnl, vitamin K- 1.03 wnl- 2/8  Labs reviewed: Na 134(L), K 4.0 wnl, BUN 22(H), P 3.3 wnl, Mg 2.0 wnl- 4/1 Prealbumin- 11(L)- 3/31 Triglycerides- 129- 3/4 Wbc- 3.0(L), Hgb 7.6(L), Hct 23.9(L) Cbgs- 160, 182 x 24 hrs  Diet Order:    Diet Order             DIET SOFT Room service appropriate? Yes; Fluid consistency: Thin  Diet effective now                  EDUCATION NEEDS:   Not appropriate for education at this time  Skin:  Skin Assessment: Reviewed RN Assessment ( Midline wound: 11cm x 12cm )  Last BM:  4/1- TYPE 7  Height:   Ht Readings from Last 1 Encounters:  03/01/22 4\' 11"  (1.499 m)    Weight:   Wt Readings from Last 1 Encounters:  12/09/22 99.5 kg    Ideal Body Weight:  44.3 kg  BMI:  Body mass index is 44.3 kg/m.  Estimated Nutritional Needs:   Kcal:  1800-2100kcal/day  Protein:  90-110g/d  Fluid:  1.4-1.6L/day  Koleen Distance MS,  RD, LDN Please refer to [facility] for RD and/or RD on-call/weekend/after hours pager

## 2022-12-09 NOTE — Progress Notes (Signed)
Hypoglycemic Event  CBG: 53  Treatment: 4 oz juice/soda  Symptoms: None  Follow-up CBG: Time:2326 CBG Result:126  Possible Reasons for Event: Inadequate meal intake  Comments/MD notified:Katy Foust, NP and Dr. Gwen Pounds

## 2022-12-10 LAB — GLUCOSE, CAPILLARY
Glucose-Capillary: 168 mg/dL — ABNORMAL HIGH (ref 70–99)
Glucose-Capillary: 103 mg/dL — ABNORMAL HIGH (ref 70–99)
Glucose-Capillary: 104 mg/dL — ABNORMAL HIGH (ref 70–99)
Glucose-Capillary: 160 mg/dL — ABNORMAL HIGH (ref 70–99)

## 2022-12-10 MED ORDER — ZINC CHLORIDE 1 MG/ML IV SOLN: INTRAVENOUS | Status: DC

## 2022-12-10 MED ORDER — ZINC CHLORIDE 1 MG/ML IV SOLN
INTRAVENOUS | Status: DC
  Filled 2022-12-10 (×3): qty 672

## 2022-12-10 MED ORDER — ZINC CHLORIDE 1 MG/ML IV SOLN
INTRAVENOUS | Status: AC
  Filled 2022-12-10: qty 672

## 2022-12-10 NOTE — Progress Notes (Signed)
PHARMACY - TOTAL PARENTERAL NUTRITION CONSULT NOTE   Indication: Prolonged ileus  Patient Measurements: Height: 4\' 11"  (149.9 cm) Weight: 100.9 kg (222 lb 7.1 oz) IBW/kg (Calculated) : 43.2 TPN AdjBW (KG): 55.2 Body mass index is 44.93 kg/m.  Assessment: Debra [name] is a 57 y.o. female s/p laparotomy, excision of greater omental mass, abdominal wall reconstruction with [name], appendectomy, and placement of Prevena vac.  Glucose / Insulin:  --No apparent history of diabetes --BG controlled on current regimen.  --rSSI 4x/day (0500, 1300, 1800, 2200) --SSI last 24h: 8 units  --BG/24h: 53 - 182 --12 units insulin in TPN Electrolytes: Mild hyponatremia Renal: SCr stable at baseline  Hepatic: Liver function overall within normal limits GI Imaging: 9/11 CTAP: no new acute issues GI Surgeries / Procedures: s/p laparotomy, excision of greater omental mass, abdominal wall reconstruction with [name], appendectomy, and placement of Prevena vac  Central access: 02/15/22 TPN start date: 02/15/22  Nutritional Goals: Goal cyclic TPN over 16 hrs: cyclic TPN over 16 hrs: (provides 102 g of protein and 1811 kcals per day) total volume over 16hrs for calculations=1400 ml (1500 ml total with overfill)  RD Assessment:  Estimated Needs Total Energy Estimated Needs: 1800-2100kcal/day Total Protein Estimated Needs: 90-110g/d Total Fluid Estimated Needs: 1.4-1.6L/day  Current Nutrition:  Soft diet + nutritional supplements, not meeting PO needs yet   Plan: Transitioned to *Cyclic* TPN on [id number].   to run over 16 hours per MD request. [name] at 2000 per discussion with dietician to allow patient time for walking in afternoon/evenings.  To run over 16 hours: -Start rate at 49 mL/hr for 1 hour. -Increase rate to 98 mL/hr for 13 hours.  -Decrease rate to 49 mL/hr for 1 hour. -Decrease rate to 25 mL/hr for 1 hour, then stop.  Plan:  See cyclic TPN rate  above Nutritional Components Amino acids (using 15% Clinisol): 101 g Dextrose 19% = 266 g Lipids (using 20% SMOFlipids): 50.4 g kCal: 1817/24h  Electrolytes in TPN: Na 70 mEq/L, K 70mEq/L, Ca 34mEq/L, Mg 10 mEq/L, Phos 10 mmol/L  Continue ratio Cl:Ac 1:1  Continue trace elements, MVI, chromium, zinc and selenium per dietary recs Continue CBG/SSI for Cyclic TPN:  -CBG 2 hrs after cyclic TPN start -CBG during middle of cyclic TPN infusion -CBG 1 hr after cylic TPN stopped -CBG while off TPN Continue SSI insulin at moderate scale and decrease to 8 units of insulin in TPN If further episodes of hypoglycemia; may consider decreasing intensity of sliding scale insulin to sensitive Check TPN labs on Mondays  Benita Gutter 12/10/2022

## 2022-12-10 NOTE — Progress Notes (Signed)
[facility] PROGRESS NOTE (cpt [phone])  Hospital Day(s): 300.   Post op day(s): 298 Days Post-Op.   Interval History:  Patient seen and examined Hypoglycemia overnight, improved with orange juice No new complaints this morning; No new labs  [name] output 600 ccs + unmeasured; managing effluent independently On TPN; On Soft diet She is ambulating better; reviewed mobility notes   Review of Systems:  Constitutional: denies fever, chills  HEENT: denies cough or congestion  Respiratory: denies any shortness of breath  Cardiovascular: denies chest pain or palpitations  Gastrointestinal: denied abdominal pain, denies N/V Genitourinary: denies burning with urination or urinary frequency Integumentary: + midline wound (stable)   Vital signs in last 24 hours: [min-max] current  Temp:  [98 F (36.7 C)-99 F (37.2 C)] 98.5 F (36.9 C) (04/03 0500) Pulse Rate:  [72-93] 85 (04/03 0500) Resp:  [16-20] 16 (04/03 0500) BP: (93-114)/(52-63) 93/54 (04/03 0500) SpO2:  [98 %-100 %] 98 % (04/03 0500) Weight:  [100.9 kg] 100.9 kg (04/03 0438)     Height: 4\' 11"  (149.9 cm) Weight: 100.9 kg BMI (Calculated): 43.04   Intake/Output last 2 shifts:  04/02 0701 - 04/03 0700 In: 240 [P.O.:240] Out: 600 [Drains:600]   Physical Exam:  Constitutional: alert, cooperative and no distress  Respiratory: breathing non-labored at rest  Cardiovascular: regular rate and sinus rhythm  Gastrointestinal: Soft, non-tender, non-distended, no rebound/guarding. Integumentary: Midline wound open, peritoneum closed; granulating and new skin growing over majority of wound now; there are three stomatized areas visible in the LUQ and LLQ portions of the wound. No active bleeding.   Labs:     Latest Ref Rng & Units 12/09/2022    5:16 AM 12/08/2022    3:35 AM 12/01/2022    6:25 AM  CBC  WBC 4.0 - 10.5 K/uL 3.0  2.8  2.9   Hemoglobin 12.0 - 15.0 g/dL 7.6  7.6  8.1   Hematocrit 36.0 - 46.0 %  23.9  24.2  25.1   Platelets 150 - 400 K/uL 100  100  73       Latest Ref Rng & Units 12/08/2022    3:35 AM 12/01/2022    6:25 AM 11/24/2022    4:39 AM  CMP  Glucose 70 - 99 mg/dL 168  159  175   BUN 6 - 20 mg/dL 22  25  24    Creatinine 0.44 - 1.00 mg/dL 0.48  0.39  0.44   Sodium 135 - 145 mmol/L 134  132  132   Potassium 3.5 - 5.1 mmol/L 4.0  4.2  4.4   Chloride 98 - 111 mmol/L 107  104  103   CO2 22 - 32 mmol/L 24  23  24    Calcium 8.9 - 10.3 mg/dL 8.1  8.3  7.7   Total Protein 6.5 - 8.1 g/dL 5.8  5.7  6.1   Total Bilirubin 0.3 - 1.2 mg/dL 0.5  0.6  0.4   Alkaline Phos 38 - 126 U/L 118  122  118   AST 15 - 41 U/L 35  34  32   ALT 0 - 44 U/L 28  29  25      Imaging studies: No new pertinent imaging studies this AM   Assessment/Plan:  57 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  - New 04/03: Nothing new  - Wound Care ([name] Pouch); Appreciate WOC RN assistance. No longer on intermittent or continuous suction. Patient seems to like this better and has started to manage effluent independently. She will need intermittent suction with Yankauer to control effluent. Likely be beneficial to suction before ambulation.    - Continue soft diet as tolerated  - Continue cyclic TPN; weekly nutritional labs - Appreciate dietary assistance             - Monitor abdominal examination; on-going bowel function            - Pain control prn; antemetic prn            - Progressed with therapies; no longer any recommendations; ambulating well    - Appreciate neurosurgery assistance with compression fracture; serial XRs   - Discharge Planning: Continue to anticipate lengthy admission +/- possible transfer; no new progress regarding  disposition unfortunately   All of the above findings and recommendations were discussed with the patient, and the medical team, and all of patient's questions were answered to her expressed satisfaction.  -- Debra Simon, PA-C [facility] 12/10/2022, 7:25 AM M-F: 7am - 4pm

## 2022-12-11 LAB — GLUCOSE, CAPILLARY
Glucose-Capillary: 145 mg/dL — ABNORMAL HIGH (ref 70–99)
Glucose-Capillary: 130 mg/dL — ABNORMAL HIGH (ref 70–99)
Glucose-Capillary: 103 mg/dL — ABNORMAL HIGH (ref 70–99)
Glucose-Capillary: 88 mg/dL (ref 70–99)

## 2022-12-11 MED ORDER — ZINC CHLORIDE 1 MG/ML IV SOLN
INTRAVENOUS | Status: AC
  Filled 2022-12-11: qty 672

## 2022-12-11 NOTE — Progress Notes (Signed)
Zach PA notified about pt's bp. Pt asymptomatic and is resting. No s/s of distress noted. No orders at this time as he would like to see her first.

## 2022-12-11 NOTE — Progress Notes (Signed)
PHARMACY - TOTAL PARENTERAL NUTRITION CONSULT NOTE   Indication: Prolonged ileus  Patient Measurements: Height: 4\' 11"  (149.9 cm) Weight: 100.6 kg (221 lb 12.5 oz) IBW/kg (Calculated) : 43.2 TPN AdjBW (KG): 55.2 Body mass index is 44.79 kg/m.  Assessment: Debra [name] is a 57 y.o. female s/p laparotomy, excision of greater omental mass, abdominal wall reconstruction with [name], appendectomy, and placement of Prevena vac.  Glucose / Insulin:  --No apparent history of diabetes --BG controlled on current regimen.  --rSSI 4x/day (0500, 1300, 1800, 2200) --SSI last 24h: 5 units  --BG/24h: 103 - 168 --8 units insulin in TPN Electrolytes: Mild hyponatremia Renal: SCr stable at baseline  Hepatic: Liver function overall within normal limits GI Imaging: 9/11 CTAP: no new acute issues GI Surgeries / Procedures: s/p laparotomy, excision of greater omental mass, abdominal wall reconstruction with [name], appendectomy, and placement of Prevena vac  Central access: 02/15/22 TPN start date: 02/15/22  Nutritional Goals: Goal cyclic TPN over 16 hrs: cyclic TPN over 16 hrs: (provides 102 g of protein and 1811 kcals per day) total volume over 16hrs for calculations=1400 ml (1500 ml total with overfill)  RD Assessment:  Estimated Needs Total Energy Estimated Needs: 1800-2100kcal/day Total Protein Estimated Needs: 90-110g/d Total Fluid Estimated Needs: 1.4-1.6L/day  Current Nutrition:  Soft diet + nutritional supplements, not meeting PO needs yet   Plan: Transitioned to *Cyclic* TPN on [id number].   to run over 16 hours per MD request. [name] at 2000 per discussion with dietician to allow patient time for walking in afternoon/evenings.  To run over 16 hours: -Start rate at 49 mL/hr for 1 hour. -Increase rate to 98 mL/hr for 13 hours.  -Decrease rate to 49 mL/hr for 1 hour. -Decrease rate to 25 mL/hr for 1 hour, then stop.  Plan:  See cyclic TPN rate  above Nutritional Components Amino acids (using 15% Clinisol): 101 g Dextrose 19% = 266 g Lipids (using 20% SMOFlipids): 50.4 g kCal: 1817/24h  Electrolytes in TPN: Na 70 mEq/L, K 45mEq/L, Ca 74mEq/L, Mg 10 mEq/L, Phos 10 mmol/L  Continue ratio Cl:Ac 1:1  Continue trace elements, MVI, chromium, zinc and selenium per dietary recs Continue CBG/SSI for Cyclic TPN:  -CBG 2 hrs after cyclic TPN start -CBG during middle of cyclic TPN infusion -CBG 1 hr after cylic TPN stopped -CBG while off TPN Continue SSI insulin at moderate scale and decrease to 8 units of insulin in TPN If further episodes of hypoglycemia; may consider decreasing intensity of sliding scale insulin to sensitive Check TPN labs on Mondays  Benita Gutter 12/11/2022

## 2022-12-11 NOTE — Progress Notes (Signed)
[facility] PROGRESS NOTE (cpt [phone])  Hospital Day(s): 301.   Post op day(s): 299 Days Post-Op.   Interval History:  Patient seen and examined Notified of softer BP overnight (84/44) was resting - currently 94/57 which is near her recent baseline No new complaints this morning; no bleeding from [name]  No new labs  [name] output 600 ccs + unmeasured; managing effluent independently On TPN; On Soft diet She is ambulating better; reviewed mobility notes   Review of Systems:  Constitutional: denies fever, chills  HEENT: denies cough or congestion  Respiratory: denies any shortness of breath  Cardiovascular: denies chest pain or palpitations  Gastrointestinal: denied abdominal pain, denies N/V Genitourinary: denies burning with urination or urinary frequency Integumentary: + midline wound (stable)   Vital signs in last 24 hours: [min-max] current  Temp:  [98.2 F (36.8 C)-98.9 F (37.2 C)] 98.9 F (37.2 C) (04/04 0744) Pulse Rate:  [67-76] 67 (04/04 0744) Resp:  [16-18] 18 (04/04 0744) BP: (84-104)/(44-60) 94/57 (04/04 0744) SpO2:  [98 %-100 %] 98 % (04/04 0744) Weight:  [100.6 kg] 100.6 kg (04/04 0500)     Height: 4\' 11"  (149.9 cm) Weight: 100.6 kg BMI (Calculated): 43.04   Intake/Output last 2 shifts:  04/03 0701 - 04/04 0700 In: 720 [P.O.:720] Out: -    Physical Exam:  Constitutional: alert, cooperative and no distress  Respiratory: breathing non-labored at rest  Cardiovascular: regular rate and sinus rhythm  Gastrointestinal: Soft, non-tender, non-distended, no rebound/guarding. Integumentary: Midline wound open, peritoneum closed; granulating and new skin growing over majority of wound now; there are three stomatized areas visible in the LUQ and LLQ portions of the wound. No active bleeding.   Labs:     Latest Ref Rng & Units 12/09/2022    5:16 AM 12/08/2022    3:35 AM 12/01/2022    6:25 AM  CBC  WBC 4.0 - 10.5 K/uL 3.0  2.8  2.9    Hemoglobin 12.0 - 15.0 g/dL 7.6  7.6  8.1   Hematocrit 36.0 - 46.0 % 23.9  24.2  25.1   Platelets 150 - 400 K/uL 100  100  73       Latest Ref Rng & Units 12/08/2022    3:35 AM 12/01/2022    6:25 AM 11/24/2022    4:39 AM  CMP  Glucose 70 - 99 mg/dL 168  159  175   BUN 6 - 20 mg/dL 22  25  24    Creatinine 0.44 - 1.00 mg/dL 0.48  0.39  0.44   Sodium 135 - 145 mmol/L 134  132  132   Potassium 3.5 - 5.1 mmol/L 4.0  4.2  4.4   Chloride 98 - 111 mmol/L 107  104  103   CO2 22 - 32 mmol/L 24  23  24    Calcium 8.9 - 10.3 mg/dL 8.1  8.3  7.7   Total Protein 6.5 - 8.1 g/dL 5.8  5.7  6.1   Total Bilirubin 0.3 - 1.2 mg/dL 0.5  0.6  0.4   Alkaline Phos 38 - 126 U/L 118  122  118   AST 15 - 41 U/L 35  34  32   ALT 0 - 44 U/L 28  29  25      Imaging studies: No new pertinent imaging studies this AM   Assessment/Plan:  57 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  - New 04/04: BP stable, no bleeding, will monitor. No need for IVF bolus at this time. Otherwise nothing new  - Wound Care ([name] Pouch); Now on smaller [name] pouch; continues managing effluent independently. Intermittent oozing from time to time. Change as needed.    - Continue soft diet as tolerated  - Continue cyclic TPN; weekly nutritional labs - Appreciate dietary assistance - was concern for fluid overload given weight gain; hesitant to give diuretics secondary to BP             - Monitor abdominal examination; on-going bowel function            - Pain control prn; antemetic prn            - Progressed with therapies; no longer any recommendations; ambulating well    - Appreciate neurosurgery assistance with compression fracture; serial XRs   - Discharge  Planning: Continue to anticipate lengthy admission +/- possible transfer; no new progress regarding disposition unfortunately   All of the above findings and recommendations were discussed with the patient, and the medical team, and all of patient's questions were answered to her expressed satisfaction.  -- [name], PA-C [facility] 12/11/2022, 9:29 AM M-F: 7am - 4pm

## 2022-12-12 LAB — GLUCOSE, CAPILLARY
Glucose-Capillary: 123 mg/dL — ABNORMAL HIGH (ref 70–99)
Glucose-Capillary: 147 mg/dL — ABNORMAL HIGH (ref 70–99)
Glucose-Capillary: 147 mg/dL — ABNORMAL HIGH (ref 70–99)
Glucose-Capillary: 182 mg/dL — ABNORMAL HIGH (ref 70–99)
Glucose-Capillary: 80 mg/dL (ref 70–99)
Glucose-Capillary: 93 mg/dL (ref 70–99)

## 2022-12-12 MED ORDER — ZINC CHLORIDE 1 MG/ML IV SOLN
INTRAVENOUS | Status: AC
  Filled 2022-12-12: qty 672

## 2022-12-12 NOTE — Progress Notes (Signed)
PHARMACY - TOTAL PARENTERAL NUTRITION CONSULT NOTE   Indication: Prolonged ileus  Patient Measurements: Height: 4\' 11"  (149.9 cm) Weight: 100.6 kg (221 lb 12.5 oz) IBW/kg (Calculated) : 43.2 TPN AdjBW (KG): 55.2 Body mass index is 44.79 kg/m.  Assessment: Debra [name] is a 57 y.o. female s/p laparotomy, excision of greater omental mass, abdominal wall reconstruction with [name], appendectomy, and placement of Prevena vac.  Glucose / Insulin:  --No apparent history of diabetes --BG controlled on current regimen.  --rSSI 4x/day (0500, 1300, 1800, 2200) --SSI last 24h: 4 units  --BG/24h: 88 - 145 --8 units insulin in TPN Electrolytes: Mild hyponatremia Renal: SCr stable at baseline  Hepatic: Liver function overall within normal limits GI Imaging: 9/11 CTAP: no new acute issues GI Surgeries / Procedures: s/p laparotomy, excision of greater omental mass, abdominal wall reconstruction with [name], appendectomy, and placement of Prevena vac  Central access: 02/15/22 TPN start date: 02/15/22  Nutritional Goals: Goal cyclic TPN over 16 hrs: cyclic TPN over 16 hrs: (provides 102 g of protein and 1811 kcals per day) total volume over 16hrs for calculations=1400 ml (1500 ml total with overfill)  RD Assessment:  Estimated Needs Total Energy Estimated Needs: 1800-2100kcal/day Total Protein Estimated Needs: 90-110g/d Total Fluid Estimated Needs: 1.4-1.6L/day  Current Nutrition:  Soft diet + nutritional supplements, not meeting PO needs yet   Plan: Transitioned to *Cyclic* TPN on 2/99/37.   to run over 16 hours per MD request. [name] at 2000 per discussion with dietician to allow patient time for walking in afternoon/evenings.  To run over 16 hours: -Start rate at 49 mL/hr for 1 hour. -Increase rate to 98 mL/hr for 13 hours.  -Decrease rate to 49 mL/hr for 1 hour. -Decrease rate to 25 mL/hr for 1 hour, then stop.  Plan:  See cyclic TPN rate  above Nutritional Components Amino acids (using 15% Clinisol): 101 g Dextrose 19% = 266 g Lipids (using 20% SMOFlipids): 50.4 g kCal: 1817/24h  Electrolytes in TPN: Na 70 mEq/L, K 17mEq/L, Ca 3mEq/L, Mg 10 mEq/L, Phos 10 mmol/L  Continue ratio Cl:Ac 1:1  Continue trace elements, MVI, chromium, zinc and selenium per dietary recs Continue CBG/SSI for Cyclic TPN:  -CBG 2 hrs after cyclic TPN start -CBG during middle of cyclic TPN infusion -CBG 1 hr after cylic TPN stopped -CBG while off TPN Continue SSI insulin at moderate scale and decrease to 8 units of insulin in TPN If further episodes of hypoglycemia; may consider decreasing intensity of sliding scale insulin to sensitive Check TPN labs on Mondays  [name] 12/12/2022

## 2022-12-12 NOTE — Plan of Care (Signed)
  Problem: Clinical Measurements: Goal: Will remain free from infection Outcome: Progressing   Problem: Nutrition: Goal: Adequate nutrition will be maintained Outcome: Progressing   Problem: Activity: Goal: Risk for activity intolerance will decrease Outcome: Progressing   Problem: Nutrition: Goal: Adequate nutrition will be maintained Outcome: Progressing   Problem: Coping: Goal: Level of anxiety will decrease Outcome: Progressing   Problem: Pain Managment: Goal: General experience of comfort will improve Outcome: Progressing   

## 2022-12-12 NOTE — Progress Notes (Signed)
[facility] SUR[facility]ICAL PROGRESS NOTE (cpt [phone])  Hospital Day(s): 302.   Post op day(s): 300 Days Post-Op.   Interval History:  Patient seen and examined No further issues with soft BP Nothing else new overnight  Resting comfortably No new complaints this morning; no bleeding from [name]  No new labs today; weekly nutritional labs on Mondays [name] output unmeasured; managing effluent independently On TPN; On Soft diet She is ambulating better; reviewed mobility notes   Review of Systems:  Constitutional: denies fever, chills  HEENT: denies cough or congestion  Respiratory: denies any shortness of breath  Cardiovascular: denies chest pain or palpitations  Gastrointestinal: denied abdominal pain, denies N/V Genitourinary: denies burning with urination or urinary frequency Integumentary: + midline wound (stable)   Vital signs in last 24 hours: [min-max] current  Temp:  [98 F (36.7 C)-98.2 F (36.8 C)] 98.1 F (36.7 C) (04/05 0402) Pulse Rate:  [71-88] 88 (04/05 0402) Resp:  [16-18] 16 (04/05 0402) BP: (102-111)/(55-60) 111/60 (04/05 0402) SpO2:  [98 %-100 %] 100 % (04/05 0402)     Height: 4\' 11"  (149.9 cm) Weight: 100.6 kg BMI (Calculated): 43.04   Intake/Output last 2 shifts:  04/04 0701 - 04/05 0700 In: 507.9 [I.V.:507.9] Out: -    Physical Exam:  Constitutional: alert, cooperative and no distress  Respiratory: breathing non-labored at rest  Cardiovascular: regular rate and sinus rhythm  Gastrointestinal: Soft, non-tender, non-distended, no rebound/guarding. Integumentary: Midline wound open, peritoneum closed; granulating and new skin growing over majority of wound now; there are three stomatized areas visible in the LUQ and LLQ portions of the wound. No active bleeding.   Labs:     Latest Ref Rng & Units 12/09/2022    5:16 AM 12/08/2022    3:35 AM 12/01/2022    6:25 AM  CBC  WBC 4.0 - 10.5 K/uL 3.0  2.8  2.9   Hemoglobin 12.0 - 15.0 g/dL 7.6  7.6   8.1   Hematocrit 36.0 - 46.0 % 23.9  24.2  25.1   Platelets 150 - 400 K/uL 100  100  73       Latest Ref Rng & Units 12/08/2022    3:35 AM 12/01/2022    6:25 AM 11/24/2022    4:39 AM  CMP  Glucose 70 - 99 mg/dL 867  544  920   BUN 6 - 20 mg/dL 22  25  24    Creatinine 0.44 - 1.00 mg/dL 1.00  7.12  1.97   Sodium 135 - 145 mmol/L 134  132  132   Potassium 3.5 - 5.1 mmol/L 4.0  4.2  4.4   Chloride 98 - 111 mmol/L 107  104  103   CO2 22 - 32 mmol/L 24  23  24    Calcium 8.9 - 10.3 mg/dL 8.1  8.3  7.7   Total Protein 6.5 - 8.1 g/dL 5.8  5.7  6.1   Total Bilirubin 0.3 - 1.2 mg/dL 0.5  0.6  0.4   Alkaline Phos 38 - 126 U/L 118  122  118   AST 15 - 41 U/L 35  34  32   ALT 0 - 44 U/L 28  29  25      Imaging studies: No new pertinent imaging studies this AM   Assessment/Plan:  57 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  - New 04/05: Nothing new today  - Wound Care ([name] Pouch); Now on smaller [name] pouch; continues managing effluent independently. Intermittent oozing from time to time. Change as needed.    - Continue soft diet as tolerated  - Continue cyclic TPN; weekly nutritional labs - Appreciate dietary assistance              - Monitor abdominal examination; on-going bowel function            - Pain control prn; antemetic prn            - Progressed with therapies; no longer any recommendations; ambulating well    - Appreciate neurosurgery assistance with compression fracture; serial XRs   - Discharge Planning: Continue to anticipate lengthy admission +/- possible transfer; no new progress regarding disposition unfortunately   All of the above findings and recommendations were discussed with the patient, and the  medical team, and all of patient's questions were answered to her expressed satisfaction.  -- [name], PA-C [facility] 12/12/2022, 7:52 AM M-F: 7am - 4pm

## 2022-12-13 MED ORDER — ZINC CHLORIDE 1 MG/ML IV SOLN
INTRAVENOUS | Status: AC
  Filled 2022-12-13: qty 672

## 2022-12-13 NOTE — Progress Notes (Signed)
PHARMACY - TOTAL PARENTERAL NUTRITION CONSULT NOTE   Indication: Prolonged ileus  Patient Measurements: Height: 4\' 11"  (149.9 cm) Weight: 100.6 kg (221 lb 12.5 oz) IBW/kg (Calculated) : 43.2 TPN AdjBW (KG): 55.2 Body mass index is 44.79 kg/m.  Assessment: Debra [name] is a 57 y.o. female s/p laparotomy, excision of greater omental mass, abdominal wall reconstruction with [name], appendectomy, and placement of Prevena vac.  Glucose / Insulin:  --No apparent history of diabetes --BG controlled on current regimen.  --rSSI 4x/day (0500, 1300, 1800, 2200) --SSI last 24h: 7 units  --BG/24h: 88 - 147 --8 units insulin in TPN Electrolytes: Mild hyponatremia Renal: SCr stable at baseline  Hepatic: Liver function overall within normal limits GI Imaging: 9/11 CTAP: no new acute issues GI Surgeries / Procedures: s/p laparotomy, excision of greater omental mass, abdominal wall reconstruction with [name], appendectomy, and placement of Prevena vac  Central access: 02/15/22 TPN start date: 02/15/22  Nutritional Goals: Goal cyclic TPN over 16 hrs: cyclic TPN over 16 hrs: (provides 102 g of protein and 1811 kcals per day) total volume over 16hrs for calculations=1400 ml (1500 ml total with overfill)  RD Assessment:  Estimated Needs Total Energy Estimated Needs: 1800-2100kcal/day Total Protein Estimated Needs: 90-110g/d Total Fluid Estimated Needs: 1.4-1.6L/day  Current Nutrition:  Soft diet + nutritional supplements, not meeting PO needs yet   Plan: Transitioned to *Cyclic* TPN on 4/91/79.   to run over 16 hours per MD request. Debra [name] at 2000 per discussion with dietician to allow patient time for walking in afternoon/evenings.  To run over 16 hours: -Start rate at 49 mL/hr for 1 hour. -Increase rate to 98 mL/hr for 13 hours.  -Decrease rate to 49 mL/hr for 1 hour. -Decrease rate to 25 mL/hr for 1 hour, then stop.  Plan:  See cyclic TPN rate  above Nutritional Components Amino acids (using 15% Clinisol): 101 g Dextrose 19% = 266 g Lipids (using 20% SMOFlipids): 50.4 g kCal: 1817/24h  Electrolytes in TPN: Na 70 mEq/L, K 66mEq/L, Ca 86mEq/L, Mg 10 mEq/L, Phos 10 mmol/L  Continue ratio Cl:Ac 1:1  Continue trace elements, MVI, chromium, zinc and selenium per dietary recs Continue CBG/SSI for Cyclic TPN:  -CBG 2 hrs after cyclic TPN start -CBG during middle of cyclic TPN infusion -CBG 1 hr after cylic TPN stopped -CBG while off TPN Continue SSI insulin at moderate scale and decrease to 8 units of insulin in TPN If further episodes of hypoglycemia; may consider decreasing intensity of sliding scale insulin to sensitive Check TPN labs on Mondays  [facility] [name] 12/13/2022

## 2022-12-13 NOTE — Plan of Care (Signed)
  Problem: Clinical Measurements: Goal: Will remain free from infection Outcome: Progressing   Problem: Nutrition: Goal: Adequate nutrition will be maintained Outcome: Progressing   Problem: Activity: Goal: Risk for activity intolerance will decrease Outcome: Progressing   Problem: Nutrition: Goal: Adequate nutrition will be maintained Outcome: Progressing   Problem: Coping: Goal: Level of anxiety will decrease Outcome: Progressing   Problem: Pain Managment: Goal: General experience of comfort will improve Outcome: Progressing   

## 2022-12-13 NOTE — Progress Notes (Signed)
Subjective:  CC: Debra [name] is a 57 y.o. female  Hospital stay day 303, 301 Days Post-Op s/p re-opening of laparotomy for repair of small bowel perforation following initial laparotomy, excision of greater omental mass, abdominal wall reconstruction with [name], appendectomy, and placement of Prevena vac on 06/08.   HPI: No issues reported overnight.  ROS:  General: Denies weight loss, weight gain, fatigue, fevers, chills, and night sweats. Heart: Denies chest pain, palpitations, racing heart, irregular heartbeat, leg pain or swelling, and decreased activity tolerance. Respiratory: Denies breathing difficulty, shortness of breath, wheezing, cough, and sputum. GI: Denies change in appetite, heartburn, nausea, vomiting, constipation, diarrhea, and blood in stool. GU: Denies difficulty urinating, pain with urinating, urgency, frequency, blood in urine.   Objective:   Temp:  [97.9 F (36.6 C)-98.7 F (37.1 C)] 97.9 F (36.6 C) (04/06 1738) Pulse Rate:  [65-90] 77 (04/06 1738) Resp:  [18-20] 18 (04/06 1738) BP: (92-106)/(56-61) 106/61 (04/06 1738) SpO2:  [96 %-100 %] 100 % (04/06 1738)     Height: 4\' 11"  (149.9 cm) Weight: 100.6 kg BMI (Calculated): 43.04   Intake/Output this shift:   Intake/Output Summary (Last 24 hours) at 12/13/2022 1744 Last data filed at 12/12/2022 2254 Gross per 24 hour  Intake 321.09 ml  Output --  Net 321.09 ml    Constitutional :  alert, cooperative, appears stated age, and no distress  Respiratory:  clear to auscultation bilaterally  Cardiovascular:  regular rate and rhythm  Gastrointestinal: Soft, no guarding, [name] pouch in place, no obvious leak today. .   Skin: Cool and moist.   Psychiatric: Normal affect, non-agitated, not confused       LABS:     Latest Ref Rng & Units 12/08/2022    3:35 AM 12/01/2022    6:25 AM 11/24/2022    4:39 AM  CMP  Glucose 70 - 99 mg/dL 967  893  810   BUN 6 - 20 mg/dL 22  25  24    Creatinine  0.44 - 1.00 mg/dL 1.75  1.02  5.85   Sodium 135 - 145 mmol/L 134  132  132   Potassium 3.5 - 5.1 mmol/L 4.0  4.2  4.4   Chloride 98 - 111 mmol/L 107  104  103   CO2 22 - 32 mmol/L 24  23  24    Calcium 8.9 - 10.3 mg/dL 8.1  8.3  7.7   Total Protein 6.5 - 8.1 g/dL 5.8  5.7  6.1   Total Bilirubin 0.3 - 1.2 mg/dL 0.5  0.6  0.4   Alkaline Phos 38 - 126 U/L 118  122  118   AST 15 - 41 U/L 35  34  32   ALT 0 - 44 U/L 28  29  25        Latest Ref Rng & Units 12/09/2022    5:16 AM 12/08/2022    3:35 AM 12/01/2022    6:25 AM  CBC  WBC 4.0 - 10.5 K/uL 3.0  2.8  2.9   Hemoglobin 12.0 - 15.0 g/dL 7.6  7.6  8.1   Hematocrit 36.0 - 46.0 % 23.9  24.2  25.1   Platelets 150 - 400 K/uL 100  100  73     RADS: N/a Assessment:   s/p re-opening of laparotomy for repair of small bowel perforation following initial laparotomy, excision of greater omental mass, abdominal wall reconstruction with [name], appendectomy, and placement of Prevena vac on 06/08.   Stable.  No change or new complaints.  Continue current management.  labs/images/medications/previous chart entries reviewed personally and relevant changes/updates noted above.

## 2022-12-14 LAB — GLUCOSE, CAPILLARY
Glucose-Capillary: 121 mg/dL — ABNORMAL HIGH (ref 70–99)
Glucose-Capillary: 133 mg/dL — ABNORMAL HIGH (ref 70–99)

## 2022-12-14 MED ORDER — ZINC CHLORIDE 1 MG/ML IV SOLN
INTRAVENOUS | Status: AC
  Filled 2022-12-14: qty 672

## 2022-12-14 NOTE — Progress Notes (Signed)
Subjective:  CC: Debra [name] is a 57 y.o. female  Hospital stay day 304, 302 Days Post-Op s/p re-opening of laparotomy for repair of small bowel perforation following initial laparotomy, excision of greater omental mass, abdominal wall reconstruction with [name], appendectomy, and placement of Prevena vac on 06/08.   HPI: No issues reported overnight.  ROS:  General: Denies weight loss, weight gain, fatigue, fevers, chills, and night sweats. Heart: Denies chest pain, palpitations, racing heart, irregular heartbeat, leg pain or swelling, and decreased activity tolerance. Respiratory: Denies breathing difficulty, shortness of breath, wheezing, cough, and sputum. GI: Denies change in appetite, heartburn, nausea, vomiting, constipation, diarrhea, and blood in stool. GU: Denies difficulty urinating, pain with urinating, urgency, frequency, blood in urine.   Objective:   Temp:  [97.9 F (36.6 C)-98.5 F (36.9 C)] 98.5 F (36.9 C) (04/07 0758) Pulse Rate:  [72-80] 80 (04/07 0758) Resp:  [17-18] 18 (04/07 0758) BP: (96-106)/(46-61) 96/52 (04/07 0758) SpO2:  [97 %-100 %] 97 % (04/07 0758) Weight:  [102.1 kg] 102.1 kg (04/07 0352)     Height: 4\' 11"  (149.9 cm) Weight: 102.1 kg BMI (Calculated): 43.04   Intake/Output this shift:   Intake/Output Summary (Last 24 hours) at 12/14/2022 5465 Last data filed at 12/13/2022 2306 Gross per 24 hour  Intake 299.26 ml  Output --  Net 299.26 ml    Constitutional :  alert, cooperative, appears stated age, and no distress  Respiratory:  clear to auscultation bilaterally  Cardiovascular:  regular rate and rhythm  Gastrointestinal: Soft, no guarding, [name] pouch in place, no obvious leak again today. .   Skin: Cool and moist.   Psychiatric: Normal affect, non-agitated, not confused       LABS:     Latest Ref Rng & Units 12/08/2022    3:35 AM 12/01/2022    6:25 AM 11/24/2022    4:39 AM  CMP  Glucose 70 - 99 mg/dL 681  275  170    BUN 6 - 20 mg/dL 22  25  24    Creatinine 0.44 - 1.00 mg/dL 0.17  4.94  4.96   Sodium 135 - 145 mmol/L 134  132  132   Potassium 3.5 - 5.1 mmol/L 4.0  4.2  4.4   Chloride 98 - 111 mmol/L 107  104  103   CO2 22 - 32 mmol/L 24  23  24    Calcium 8.9 - 10.3 mg/dL 8.1  8.3  7.7   Total Protein 6.5 - 8.1 g/dL 5.8  5.7  6.1   Total Bilirubin 0.3 - 1.2 mg/dL 0.5  0.6  0.4   Alkaline Phos 38 - 126 U/L 118  122  118   AST 15 - 41 U/L 35  34  32   ALT 0 - 44 U/L 28  29  25        Latest Ref Rng & Units 12/09/2022    5:16 AM 12/08/2022    3:35 AM 12/01/2022    6:25 AM  CBC  WBC 4.0 - 10.5 K/uL 3.0  2.8  2.9   Hemoglobin 12.0 - 15.0 g/dL 7.6  7.6  8.1   Hematocrit 36.0 - 46.0 % 23.9  24.2  25.1   Platelets 150 - 400 K/uL 100  100  73     RADS: N/a Assessment:   s/p re-opening of laparotomy for repair of small bowel perforation following initial laparotomy, excision of greater omental mass, abdominal wall reconstruction with [name], appendectomy, and  placement of Prevena vac on 06/08.   Stable. Continue current management. Wound continues to decrease in size.  labs/images/medications/previous chart entries reviewed personally and relevant changes/updates noted above.

## 2022-12-14 NOTE — Progress Notes (Signed)
PHARMACY - TOTAL PARENTERAL NUTRITION CONSULT NOTE   Indication: Prolonged ileus  Patient Measurements: Height: 4\' 11"  (149.9 cm) Weight: 102.1 kg (225 lb 1.4 oz) IBW/kg (Calculated) : 43.2 TPN AdjBW (KG): 55.2 Body mass index is 45.46 kg/m.  Assessment: Debra [name] is a 57 y.o. female s/p laparotomy, excision of greater omental mass, abdominal wall reconstruction with [name], appendectomy, and placement of Prevena vac.  Glucose / Insulin:  --No apparent history of diabetes --BG controlled on current regimen.  --rSSI 4x/day (0500, 1300, 1800, 2200) --SSI last 24h: 7 units  --BG/24h: 88 - 147(although last checks were on 4/5?) --8 units insulin in TPN Electrolytes: Mild hyponatremia Renal: SCr stable at baseline  Hepatic: Liver function overall within normal limits GI Imaging: 9/11 CTAP: no new acute issues GI Surgeries / Procedures: s/p laparotomy, excision of greater omental mass, abdominal wall reconstruction with [name], appendectomy, and placement of Prevena vac  Central access: 02/15/22 TPN start date: 02/15/22  Nutritional Goals: Goal cyclic TPN over 16 hrs: cyclic TPN over 16 hrs: (provides 102 g of protein and 1811 kcals per day) total volume over 16hrs for calculations=1400 ml (1500 ml total with overfill)  RD Assessment:  Estimated Needs Total Energy Estimated Needs: 1800-2100kcal/day Total Protein Estimated Needs: 90-110g/d Total Fluid Estimated Needs: 1.4-1.6L/day  Current Nutrition:  Soft diet + nutritional supplements, not meeting PO needs yet   Plan: Transitioned to *Cyclic* TPN on 0/98/11.   to run over 16 hours per MD request. [name] at 2000 per discussion with dietician to allow patient time for walking in afternoon/evenings.  To run over 16 hours: -Start rate at 49 mL/hr for 1 hour. -Increase rate to 98 mL/hr for 13 hours.  -Decrease rate to 49 mL/hr for 1 hour. -Decrease rate to 25 mL/hr for 1 hour, then  stop.  Plan:  See cyclic TPN rate above Nutritional Components Amino acids (using 15% Clinisol): 101 g Dextrose 19% = 266 g Lipids (using 20% SMOFlipids): 50.4 g kCal: 1817/24h  Electrolytes in TPN: Na 70 mEq/L, K 81mEq/L, Ca 74mEq/L, Mg 10 mEq/L, Phos 10 mmol/L  Continue ratio Cl:Ac 1:1  Continue trace elements, MVI, chromium, zinc and selenium per dietary recs Continue CBG/SSI for Cyclic TPN:  -CBG 2 hrs after cyclic TPN start -CBG during middle of cyclic TPN infusion -CBG 1 hr after cylic TPN stopped -CBG while off TPN Continue SSI insulin at moderate scale and decrease to 8 units of insulin in TPN If further episodes of hypoglycemia; may consider decreasing intensity of sliding scale insulin to sensitive Check TPN labs on Mondays  [facility] A Greer Koeppen 12/14/2022

## 2022-12-15 LAB — CBC
HCT: 22.7 % — ABNORMAL LOW (ref 36.0–46.0)
Hemoglobin: 7 g/dL — ABNORMAL LOW (ref 12.0–15.0)
MCH: 27.8 pg (ref 26.0–34.0)
MCHC: 30.8 g/dL (ref 30.0–36.0)
MCV: 90.1 fL (ref 80.0–100.0)
Platelets: 80 10*3/uL — ABNORMAL LOW (ref 150–400)
RBC: 2.52 MIL/uL — ABNORMAL LOW (ref 3.87–5.11)
RDW: 15.5 % (ref 11.5–15.5)
WBC: 3.5 10*3/uL — ABNORMAL LOW (ref 4.0–10.5)
nRBC: 0 % (ref 0.0–0.2)

## 2022-12-15 LAB — COMPREHENSIVE METABOLIC PANEL
ALT: 32 U/L (ref 0–44)
AST: 40 U/L (ref 15–41)
Albumin: 2.5 g/dL — ABNORMAL LOW (ref 3.5–5.0)
Alkaline Phosphatase: 132 U/L — ABNORMAL HIGH (ref 38–126)
Anion gap: 4 — ABNORMAL LOW (ref 5–15)
BUN: 22 mg/dL — ABNORMAL HIGH (ref 6–20)
CO2: 24 mmol/L (ref 22–32)
Calcium: 8 mg/dL — ABNORMAL LOW (ref 8.9–10.3)
Chloride: 107 mmol/L (ref 98–111)
Creatinine, Ser: 0.46 mg/dL (ref 0.44–1.00)
GFR, Estimated: >60 mL/min (ref >60)
Glucose, Bld: 161 mg/dL — ABNORMAL HIGH (ref 70–99)
Potassium: 4.2 mmol/L (ref 3.5–5.1)
Sodium: 135 mmol/L (ref 135–145)
Total Bilirubin: 0.5 mg/dL (ref 0.3–1.2)
Total Protein: 5.8 g/dL — ABNORMAL LOW (ref 6.5–8.1)

## 2022-12-15 LAB — TYPE AND SCREEN: Antibody Screen: NEGATIVE

## 2022-12-15 LAB — GLUCOSE, CAPILLARY
Glucose-Capillary: 136 mg/dL — ABNORMAL HIGH (ref 70–99)
Glucose-Capillary: 142 mg/dL — ABNORMAL HIGH (ref 70–99)
Glucose-Capillary: 159 mg/dL — ABNORMAL HIGH (ref 70–99)
Glucose-Capillary: 160 mg/dL — ABNORMAL HIGH (ref 70–99)
Glucose-Capillary: 166 mg/dL — ABNORMAL HIGH (ref 70–99)
Glucose-Capillary: 177 mg/dL — ABNORMAL HIGH (ref 70–99)
Glucose-Capillary: 188 mg/dL — ABNORMAL HIGH (ref 70–99)
Glucose-Capillary: 202 mg/dL — ABNORMAL HIGH (ref 70–99)
Glucose-Capillary: 136 mg/dL — ABNORMAL HIGH (ref 70–99)
Glucose-Capillary: 75 mg/dL (ref 70–99)
Glucose-Capillary: 83 mg/dL (ref 70–99)

## 2022-12-15 LAB — PHOSPHORUS: Phosphorus: 3 mg/dL (ref 2.5–4.6)

## 2022-12-15 LAB — BPAM RBC
Blood Product Expiration Date: 202404252359
ISSUE DATE / TIME: 202404081415

## 2022-12-15 LAB — MAGNESIUM: Magnesium: 2 mg/dL (ref 1.7–2.4)

## 2022-12-15 LAB — PREPARE RBC (CROSSMATCH)

## 2022-12-15 MED ORDER — SODIUM CHLORIDE 0.9% IV SOLUTION: Freq: Once | INTRAVENOUS | Status: AC

## 2022-12-15 MED ORDER — ZINC CHLORIDE 1 MG/ML IV SOLN
INTRAVENOUS | Status: AC
  Filled 2022-12-15: qty 672

## 2022-12-15 NOTE — Progress Notes (Signed)
PHARMACY - TOTAL PARENTERAL NUTRITION CONSULT NOTE   Indication: Prolonged ileus  Patient Measurements: Height: 4\' 11"  (149.9 cm) Weight: 102 kg (224 lb 13.9 oz) IBW/kg (Calculated) : 43.2 TPN AdjBW (KG): 55.2 Body mass index is 45.42 kg/m.  Assessment: Debra [name] is a 58 y.o. female s/p laparotomy, excision of greater omental mass, abdominal wall reconstruction with [name], appendectomy, and placement of Prevena vac.  Glucose / Insulin:  --No apparent history of diabetes --BG controlled on current regimen.  --rSSI 4x/day (0500, 1300, 1800, 2200) --SSI last 24h: 12 units  --BG/24h: 121 - 133 (only 2 BG checks recorded in chart on 4/7 though sliding scale insulin administered at 5 different time points) --8 units insulin in TPN Electrolytes: Within normal limits Renal: SCr stable at baseline  Hepatic: Liver function within normal limits GI Imaging: 9/11 CTAP: no new acute issues GI Surgeries / Procedures: s/p laparotomy, excision of greater omental mass, abdominal wall reconstruction with [name], appendectomy, and placement of Prevena vac  Central access: 02/15/22 TPN start date: 02/15/22  Nutritional Goals: Goal cyclic TPN over 16 hrs: cyclic TPN over 16 hrs: (provides 102 g of protein and 1811 kcals per day) total volume over 16hrs for calculations=1400 ml (1500 ml total with overfill)  RD Assessment:  Estimated Needs Total Energy Estimated Needs: 1800-2100kcal/day Total Protein Estimated Needs: 90-110g/d Total Fluid Estimated Needs: 1.4-1.6L/day  Current Nutrition:  Soft diet + nutritional supplements, not meeting PO needs yet   Plan: Transitioned to *Cyclic* TPN on 4/33/29.   to run over 16 hours per MD request. [name] at 2000 per discussion with dietician to allow patient time for walking in afternoon/evenings.  To run over 16 hours: -Start rate at 49 mL/hr for 1 hour. -Increase rate to 98 mL/hr for 13 hours.  -Decrease rate to  49 mL/hr for 1 hour. -Decrease rate to 25 mL/hr for 1 hour, then stop.  Plan:  See cyclic TPN rate above Nutritional Components Amino acids (using 15% Clinisol): 101 g Dextrose 19% = 266 g Lipids (using 20% SMOFlipids): 50.4 g kCal: 1817/24h  Electrolytes in TPN: Na 70 mEq/L, K 45mEq/L, Ca 39mEq/L, Mg 10 mEq/L, Phos 10 mmol/L  Continue ratio Cl:Ac 1:1  Continue trace elements, MVI, chromium, zinc and selenium per dietary recs Continue CBG/SSI for Cyclic TPN:  -CBG 2 hrs after cyclic TPN start -CBG during middle of cyclic TPN infusion -CBG 1 hr after cylic TPN stopped -CBG while off TPN Continue SSI insulin at moderate scale and add 8 units of insulin in TPN Check TPN labs on Mondays  [name] 12/15/2022

## 2022-12-15 NOTE — Progress Notes (Signed)
[facility] SURGICAL PROGRESS NOTE (cpt [phone])  Hospital Day(s): 305.   Post op day(s): 303 Days Post-Op.   Interval History:  Patient seen and examined No acute events overnight nor the week Hgb to 7.0 this AM; likely combination of dilution and anemia of chronic disease - no active bleeding Otherwise labs generally reassuring  [name] output unmeasured; managing effluent independently On TPN; On Soft diet She is ambulating better; reviewed mobility notes   Review of Systems:  Constitutional: denies fever, chills  HEENT: denies cough or congestion  Respiratory: denies any shortness of breath  Cardiovascular: denies chest pain or palpitations  Gastrointestinal: denied abdominal pain, denies N/V Genitourinary: denies burning with urination or urinary frequency Integumentary: + midline wound (stable)   Vital signs in last 24 hours: [min-max] current  Temp:  [98.1 F (36.7 C)-98.5 F (36.9 C)] 98.2 F (36.8 C) (04/08 0406) Pulse Rate:  [77-87] 87 (04/08 0406) Resp:  [16-20] 16 (04/08 0406) BP: (96-119)/(52-81) 103/53 (04/08 0406) SpO2:  [97 %-100 %] 100 % (04/08 0406) Weight:  [102 kg] 102 kg (04/08 0406)     Height: 4\' 11"  (149.9 cm) Weight: 102 kg BMI (Calculated): 43.04   Intake/Output last 2 shifts:  04/07 0701 - 04/08 0700 In: 466.9 [I.V.:466.9] Out: -    Physical Exam:  Constitutional: alert, cooperative and no distress  Respiratory: breathing non-labored at rest  Cardiovascular: regular rate and sinus rhythm  Gastrointestinal: Soft, non-tender, non-distended, no rebound/guarding. Integumentary: Midline wound open, peritoneum closed; granulating and new skin growing over majority of wound now; there are three stomatized areas visible in the LUQ and LLQ portions of the wound. No active bleeding.   Labs:     Latest Ref Rng & Units 12/15/2022    6:03 AM 12/09/2022    5:16 AM 12/08/2022    3:35 AM  CBC  WBC 4.0 - 10.5 K/uL 3.5  3.0  2.8   Hemoglobin  12.0 - 15.0 g/dL 7.0  7.6  7.6   Hematocrit 36.0 - 46.0 % 22.7  23.9  24.2   Platelets 150 - 400 K/uL 80  100  100       Latest Ref Rng & Units 12/15/2022    6:03 AM 12/08/2022    3:35 AM 12/01/2022    6:25 AM  CMP  Glucose 70 - 99 mg/dL 644  034  742   BUN 6 - 20 mg/dL 22  22  25    Creatinine 0.44 - 1.00 mg/dL 5.95  6.38  7.56   Sodium 135 - 145 mmol/L 135  134  132   Potassium 3.5 - 5.1 mmol/L 4.2  4.0  4.2   Chloride 98 - 111 mmol/L 107  107  104   CO2 22 - 32 mmol/L 24  24  23    Calcium 8.9 - 10.3 mg/dL 8.0  8.1  8.3   Total Protein 6.5 - 8.1 g/dL 5.8  5.8  5.7   Total Bilirubin 0.3 - 1.2 mg/dL 0.5  0.5  0.6   Alkaline Phos 38 - 126 U/L 132  118  122   AST 15 - 41 U/L 40  35  34   ALT 0 - 44 U/L 32  28  29     Imaging studies: No new pertinent imaging studies this AM   Assessment/Plan:  57 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  - New 04/08: Suspect Hgb secondary to anemia of chronic disease/dilutional. No signs of bleeding. She has required intermittent transfusion in the past. Will give 1 unit pRBCs and recheck H&H in AM  - Wound Care ([name] Pouch); Now on smaller [name] pouch; continues managing effluent independently. Intermittent oozing from time to time. Change as needed.    - Continue soft diet as tolerated  - Continue cyclic TPN; weekly nutritional labs - Appreciate dietary assistance              - Monitor abdominal examination; on-going bowel function            - Pain control prn; antemetic prn            - Progressed with therapies; no longer any recommendations; ambulating well    - Appreciate neurosurgery assistance with compression fracture; serial XRs   - Discharge Planning: Continue to  anticipate lengthy admission +/- possible transfer; no new progress regarding disposition unfortunately   All of the above findings and recommendations were discussed with the patient, and the medical team, and all of patient's questions were answered to her expressed satisfaction.  -- [name], PA-C [facility] 12/15/2022, 7:38 AM M-F: 7am - 4pm

## 2022-12-16 LAB — CBC
HCT: 26.7 % — ABNORMAL LOW (ref 36.0–46.0)
Hemoglobin: 8.6 g/dL — ABNORMAL LOW (ref 12.0–15.0)
MCH: 28.3 pg (ref 26.0–34.0)
MCHC: 32.2 g/dL (ref 30.0–36.0)
MCV: 87.8 fL (ref 80.0–100.0)
Platelets: 86 10*3/uL — ABNORMAL LOW (ref 150–400)
RBC: 3.04 MIL/uL — ABNORMAL LOW (ref 3.87–5.11)
RDW: 15.5 % (ref 11.5–15.5)
WBC: 3.7 10*3/uL — ABNORMAL LOW (ref 4.0–10.5)
nRBC: 0 % (ref 0.0–0.2)

## 2022-12-16 LAB — GLUCOSE, CAPILLARY
Glucose-Capillary: 142 mg/dL — ABNORMAL HIGH (ref 70–99)
Glucose-Capillary: 111 mg/dL — ABNORMAL HIGH (ref 70–99)
Glucose-Capillary: 117 mg/dL — ABNORMAL HIGH (ref 70–99)
Glucose-Capillary: 120 mg/dL — ABNORMAL HIGH (ref 70–99)

## 2022-12-16 LAB — BPAM RBC: Unit Type and Rh: 5100

## 2022-12-16 LAB — TYPE AND SCREEN
ABO/RH(D): O POS
Unit division: 0

## 2022-12-16 MED ORDER — ZINC CHLORIDE 1 MG/ML IV SOLN
INTRAVENOUS | Status: AC
  Filled 2022-12-16: qty 672

## 2022-12-16 NOTE — Progress Notes (Signed)
Nutrition Follow-up  DOCUMENTATION CODES:   Obesity unspecified  INTERVENTION:   Continue cyclic TPN per pharmacy (16 hrs)- provides 1811kcal/day and 102g/day protein   Ensure Enlive po BID, each supplement provides 350 kcal and 20 grams of protein.  MVI, chromium and trace elements in TPN   Triglycerides checked monthly   Daily weights   Vitamin D2- 50,000 units po weekly   Zinc additional 5mg  daily added to TPN   Selenium additional 60mcg daily added to TPN   Will check routine vitamin labs 4/11- will include carnitine and aluminum.   NUTRITION DIAGNOSIS:   Increased nutrient needs related to wound healing, catabolic illness as evidenced by estimated needs. -ongoing   GOAL:   Patient will meet greater than or equal to 90% of their needs -met with TPN   MONITOR:   PO intake, Supplement acceptance, Labs, Weight trends, Diet advancement, I & O's, TPN  ASSESSMENT:   57 y/o female with h/o hypothyroidism, COVID 19 (3/21), kidney stones and stage 3 colon cancer (s/p left hemicolectomy 5/21 and chemotherapy) who is admitted for new pelvic mass now s/p laparotomy 6/8 (with excision of pelvic mass from greater omentum, abdominal wall reconstruction with bilateral myocutaneous flaps and mesh, incisional hernia repair, appendectomy repair and VAC placement) complicated by bowel perforation s/p reopening of recent laparotomy 6/10 (with repair of small bowel perforation, excision of mesh, placement of two phasix mesh and VAC placement). Pathology returned as metastatic adenocarcinoma. Pt with L1 compression fracture.   Pt continues to tolerate TPN well at goal rate; TPN is currently being cycled for 16 hrs. Triglycerides wnl and are being checked monthly. Hyperglycemia improved with insulin in TPN. Volume status being monitored; pt up ~28lbs since admission. Will check routine vitamin labs every 2-3 months as pt on chronic TPN (next due 4/11). Pt continues to have poor oral intake;  pt eating <25% of meals. Pt on soft diet. [name] pouch with 300ml + unmeasured output. No discharge plan in place.   Medications reviewed and include: D2, insulin, synthroid, protonix, TPN  -Selenium 92(L), zinc 66 wnl- 3/15 -Chromium- 2.4(H), copper- 105 wnl, Manganese- 13.1 wnl, vitamin D 34.2 wnl, Iodine 74.2 wnl- 2/8 -Iron 23(L), TIBC 364, ferritin 17 wnl, transferrin 261 wnl, folate 14.5 wnl, B12 585- 2/8 -Vitamin B1- 75.8, vitamin A- 27.1, B6- 21.9 wnl, vitamin C- 0.7, vitamin E- 11.6 wnl, vitamin K- 1.03 wnl- 2/8  Labs reviewed: K 4.2 wnl, BUN 22(H), P 3.0 wnl, Mg 2.0 wnl- 4/8 Prealbumin- 11(L)- 3/31 Triglycerides- 129- 3/4 Wbc- 3.7(L), Hgb 8.6(L), Hct 26.7(L) Cbgs- 111, 142 x 24 hrs  Diet Order:    Diet Order             DIET SOFT Room service appropriate? Yes; Fluid consistency: Thin  Diet effective now                  EDUCATION NEEDS:   Not appropriate for education at this time  Skin:  Skin Assessment: Reviewed RN Assessment ( Midline wound: 11cm x 12cm )  Last BM:  4/9- type 6  Height:   Ht Readings from Last 1 Encounters:  03/01/22 4\' 11"  (1.499 m)    Weight:   Wt Readings from Last 1 Encounters:  12/16/22 102.4 kg    Ideal Body Weight:  44.3 kg  BMI:  Body mass index is 45.6 kg/m.  Estimated Nutritional Needs:   Kcal:  1800-2100kcal/day  Protein:  90-110g/d  Fluid:  1.4-1.6L/day  Betsey Holidayasey Kamerin Grumbine MS, RD,  LDN Please refer to [facility] for RD and/or RD on-call/weekend/after hours pager

## 2022-12-16 NOTE — Progress Notes (Signed)
PHARMACY - TOTAL PARENTERAL NUTRITION CONSULT NOTE   Indication: Prolonged ileus  Patient Measurements: Height: 4\' 11"  (149.9 cm) Weight: 102.4 kg (225 lb 12 oz) IBW/kg (Calculated) : 43.2 TPN AdjBW (KG): 55.2 Body mass index is 45.6 kg/m.  Assessment: Debra [name] is a 57 y.o. female s/p laparotomy, excision of greater omental mass, abdominal wall reconstruction with [name], appendectomy, and placement of Prevena vac.  Glucose / Insulin:  --No apparent history of diabetes --BG controlled on current regimen.  --rSSI 4x/day (0500, 1300, 1800, 2200) --SSI last 24h: 5 units  --BG/24h: 75 - 160 --8 units insulin in TPN Electrolytes: Within normal limits Renal: SCr stable at baseline  Hepatic: Liver function within normal limits GI Imaging: 9/11 CTAP: no new acute issues GI Surgeries / Procedures: s/p laparotomy, excision of greater omental mass, abdominal wall reconstruction with [name], appendectomy, and placement of Prevena vac  Central access: 02/15/22 TPN start date: 02/15/22  Nutritional Goals: Goal cyclic TPN over 16 hrs: cyclic TPN over 16 hrs: (provides 102 g of protein and 1811 kcals per day) total volume over 16hrs for calculations=1400 ml (1500 ml total with overfill)  RD Assessment:  Estimated Needs Total Energy Estimated Needs: 1800-2100kcal/day Total Protein Estimated Needs: 90-110g/d Total Fluid Estimated Needs: 1.4-1.6L/day  Current Nutrition:  Soft diet + nutritional supplements, not meeting PO needs yet   Plan: Transitioned to *Cyclic* TPN on 9/83/38.   to run over 16 hours per MD request. [name] at 2000 per discussion with dietician to allow patient time for walking in afternoon/evenings.  To run over 16 hours: -Start rate at 49 mL/hr for 1 hour. -Increase rate to 98 mL/hr for 13 hours.  -Decrease rate to 49 mL/hr for 1 hour. -Decrease rate to 25 mL/hr for 1 hour, then stop.  Plan:  See cyclic TPN rate  above Nutritional Components Amino acids (using 15% Clinisol): 101 g Dextrose 19% = 266 g Lipids (using 20% SMOFlipids): 50.4 g kCal: 1817/24h  Electrolytes in TPN: Na 70 mEq/L, K 48mEq/L, Ca 66mEq/L, Mg 10 mEq/L, Phos 10 mmol/L  Continue ratio Cl:Ac 1:1  Continue trace elements, MVI, chromium, zinc and selenium per dietary recs Continue CBG/SSI for Cyclic TPN:  -CBG 2 hrs after cyclic TPN start -CBG during middle of cyclic TPN infusion -CBG 1 hr after cylic TPN stopped -CBG while off TPN Continue SSI insulin at moderate scale and add 8 units of insulin in TPN Check TPN labs on Mondays  [name] 12/16/2022

## 2022-12-16 NOTE — Progress Notes (Signed)
Patient's left upper arm is warm,firm and swollen. Radial pulse detected. Positive blood return from PICC line. Notified Dr. Everlene Farrier of findings.

## 2022-12-16 NOTE — Progress Notes (Signed)
[facility] SURGICAL PROGRESS NOTE (cpt [phone])  Hospital Day(s): 306.   Post op day(s): 304 Days Post-Op.   Interval History:  Patient seen and examined No acute events overnight nor the week Hgb up to 8.6; responded to transfusion  No new issues  [name] output unmeasured; managing effluent independently On TPN; On Soft diet She is ambulating better; reviewed mobility notes   Review of Systems:  Constitutional: denies fever, chills  HEENT: denies cough or congestion  Respiratory: denies any shortness of breath  Cardiovascular: denies chest pain or palpitations  Gastrointestinal: denied abdominal pain, denies N/V Genitourinary: denies burning with urination or urinary frequency Integumentary: + midline wound (stable)   Vital signs in last 24 hours: [min-max] current  Temp:  [97.1 F (36.2 C)-99 F (37.2 C)] 99 F (37.2 C) (04/09 0838) Pulse Rate:  [65-89] 75 (04/09 0838) Resp:  [16-18] 18 (04/09 0838) BP: (96-130)/(44-79) 105/58 (04/09 0838) SpO2:  [94 %-100 %] 94 % (04/09 0838) Weight:  [102.4 kg] 102.4 kg (04/09 0342)     Height: 4\' 11"  (149.9 cm) Weight: 102.4 kg BMI (Calculated): 43.04   Intake/Output last 2 shifts:  04/08 0701 - 04/09 0700 In: 1149 [P.O.:120; I.V.:612; Blood:417] Out: 600 [Drains:300; Stool:300]   Physical Exam:  Constitutional: alert, cooperative and no distress  Respiratory: breathing non-labored at rest  Cardiovascular: regular rate and sinus rhythm  Gastrointestinal: Soft, non-tender, non-distended, no rebound/guarding. Integumentary: Midline wound open, peritoneum closed; granulating and new skin growing over majority of wound now; there are three stomatized areas visible in the LUQ and LLQ portions of the wound. No active bleeding.   Labs:     Latest Ref Rng & Units 12/16/2022    6:19 AM 12/15/2022    6:03 AM 12/09/2022    5:16 AM  CBC  WBC 4.0 - 10.5 K/uL 3.7  3.5  3.0   Hemoglobin 12.0 - 15.0 g/dL 8.6  7.0  7.6    Hematocrit 36.0 - 46.0 % 26.7  22.7  23.9   Platelets 150 - 400 K/uL 86  80  100       Latest Ref Rng & Units 12/15/2022    6:03 AM 12/08/2022    3:35 AM 12/01/2022    6:25 AM  CMP  Glucose 70 - 99 mg/dL 829  562  130   BUN 6 - 20 mg/dL 22  22  25    Creatinine 0.44 - 1.00 mg/dL 8.65  7.84  6.96   Sodium 135 - 145 mmol/L 135  134  132   Potassium 3.5 - 5.1 mmol/L 4.2  4.0  4.2   Chloride 98 - 111 mmol/L 107  107  104   CO2 22 - 32 mmol/L 24  24  23    Calcium 8.9 - 10.3 mg/dL 8.0  8.1  8.3   Total Protein 6.5 - 8.1 g/dL 5.8  5.8  5.7   Total Bilirubin 0.3 - 1.2 mg/dL 0.5  0.5  0.6   Alkaline Phos 38 - 126 U/L 132  118  122   AST 15 - 41 U/L 40  35  34   ALT 0 - 44 U/L 32  28  29     Imaging studies: No new pertinent imaging studies this AM   Assessment/Plan:  57 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  - New 04/09: Hgb responded to 1 unit pRBCs (04/08); no bleeding. No new issues   - Wound Care ([name] Pouch); Now on smaller [name] pouch; continues managing effluent independently. Intermittent oozing from time to time. Change as needed.    - Continue soft diet as tolerated  - Continue cyclic TPN; weekly nutritional labs - Appreciate dietary assistance              - Monitor abdominal examination; on-going bowel function            - Pain control prn; antemetic prn            - Progressed with therapies; no longer any recommendations; ambulating well    - Appreciate neurosurgery assistance with compression fracture; serial XRs   - Discharge Planning: Continue to anticipate lengthy admission +/- possible transfer; no new progress regarding disposition unfortunately   All of the above findings and recommendations  were discussed with the patient, and the medical team, and all of patient's questions were answered to her expressed satisfaction.  -- [name], PA-C [facility] 12/16/2022, 8:50 AM M-F: 7am - 4pm

## 2022-12-17 ENCOUNTER — Inpatient Hospital Stay: Payer: Medicaid Other

## 2022-12-17 ENCOUNTER — Other Ambulatory Visit: Payer: Self-pay

## 2022-12-17 LAB — GLUCOSE, CAPILLARY
Glucose-Capillary: 164 mg/dL — ABNORMAL HIGH (ref 70–99)
Glucose-Capillary: 167 mg/dL — ABNORMAL HIGH (ref 70–99)
Glucose-Capillary: 94 mg/dL (ref 70–99)
Glucose-Capillary: 81 mg/dL (ref 70–99)

## 2022-12-17 MED ORDER — FUROSEMIDE 20 MG PO TABS
20.0000 mg | ORAL_TABLET | Freq: Two times a day (BID) | ORAL | Status: AC
  Administered 2022-12-17 – 2022-12-18 (×2): 20 mg via ORAL
  Filled 2022-12-17 (×2): qty 1

## 2022-12-17 MED ORDER — DEXTROSE 10 % IV SOLN: INTRAVENOUS | Status: AC

## 2022-12-17 MED ORDER — STERILE WATER FOR INJECTION IV SOLN
INTRAVENOUS | Status: DC
  Filled 2022-12-17: qty 672

## 2022-12-17 NOTE — Progress Notes (Addendum)
At bedside for PICC placement. Right arm assessed with no vessels appropriate for cannulation upon assessment. Cephalic vessel measured 100% catheter occupancy. No other vessels identified. Left arm restricted due to DVT. Primary RN made aware. PIV inserted in the right A/C as temporary access. Recommend for IR to place tunneled central access. Marland Kitchen

## 2022-12-17 NOTE — Progress Notes (Signed)
[facility] SURGICAL PROGRESS NOTE (cpt [phone])  Hospital Day(s): 307.   Post op day(s): 305 Days Post-Op.   Interval History:  Patient seen and examined With LUE swelling and warmth; complaining of pain; PICC in this location No new issues otherwise No new issues [name] output unmeasured; managing effluent independently On TPN; On Soft diet She is ambulating better; reviewed mobility notes   Review of Systems:  Constitutional: denies fever, chills  HEENT: denies cough or congestion  Respiratory: denies any shortness of breath  Cardiovascular: denies chest pain or palpitations  Gastrointestinal: denied abdominal pain, denies N/V Genitourinary: denies burning with urination or urinary frequency Integumentary: + midline wound (stable) MSK: + LUE swelling; warmth   Vital signs in last 24 hours: [min-max] current  Temp:  [99 F (37.2 C)-99.6 F (37.6 C)] 99 F (37.2 C) (04/10 0439) Pulse Rate:  [74-97] 95 (04/10 0439) Resp:  [17-18] 17 (04/10 0439) BP: (98-145)/(51-74) 98/51 (04/10 0439) SpO2:  [94 %-99 %] 95 % (04/10 0439) Weight:  [102.2 kg] 102.2 kg (04/10 0442)     Height: 4\' 11"  (149.9 cm) Weight: 102.2 kg BMI (Calculated): 43.04   Intake/Output last 2 shifts:  04/09 0701 - 04/10 0700 In: 930.3 [I.V.:930.3] Out: -    Physical Exam:  Constitutional: alert, cooperative and no distress  Respiratory: breathing non-labored at rest  Cardiovascular: regular rate and sinus rhythm  Gastrointestinal: Soft, non-tender, non-distended, no rebound/guarding. Integumentary: Midline wound healing by secondary intention, peritoneum closed; granulating and new skin growing over majority of wound now; there are three stomatized areas visible in the LUQ and LLQ portions of the wound. No active bleeding.  MSK: LUE is more swollen, warm to the touch, neurovascularly intact distally. PICC is in this extremity and site appears CDI   Labs:     Latest Ref Rng & Units  12/16/2022    6:19 AM 12/15/2022    6:03 AM 12/09/2022    5:16 AM  CBC  WBC 4.0 - 10.5 K/uL 3.7  3.5  3.0   Hemoglobin 12.0 - 15.0 g/dL 8.6  7.0  7.6   Hematocrit 36.0 - 46.0 % 26.7  22.7  23.9   Platelets 150 - 400 K/uL 86  80  100       Latest Ref Rng & Units 12/15/2022    6:03 AM 12/08/2022    3:35 AM 12/01/2022    6:25 AM  CMP  Glucose 70 - 99 mg/dL 629  528  413   BUN 6 - 20 mg/dL 22  22  25    Creatinine 0.44 - 1.00 mg/dL 2.44  0.10  2.72   Sodium 135 - 145 mmol/L 135  134  132   Potassium 3.5 - 5.1 mmol/L 4.2  4.0  4.2   Chloride 98 - 111 mmol/L 107  107  104   CO2 22 - 32 mmol/L 24  24  23    Calcium 8.9 - 10.3 mg/dL 8.0  8.1  8.3   Total Protein 6.5 - 8.1 g/dL 5.8  5.8  5.7   Total Bilirubin 0.3 - 1.2 mg/dL 0.5  0.5  0.6   Alkaline Phos 38 - 126 U/L 132  118  122   AST 15 - 41 U/L 40  35  34   ALT 0 - 44 U/L 32  28  29     Imaging studies: No new pertinent imaging studies this AM   Assessment/Plan:  57 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  - New 04/10: [name] to evaluate LUE; ? DVT. May need PICC holiday pending results. Otherwise [name] intact; no bleeding    - Wound Care ([name] Pouch); Now on smaller [name] pouch; continues managing effluent independently. Intermittent oozing from time to time. Change as needed.    - Continue soft diet as tolerated  - Continue cyclic TPN; weekly nutritional labs - Appreciate dietary assistance              - Monitor abdominal examination; on-going bowel function            - Pain control prn; antemetic prn            - Progressed with therapies; no longer any recommendations; ambulating well    - Appreciate neurosurgery assistance with compression fracture; serial XRs   -  Discharge Planning: Continue to anticipate lengthy admission +/- possible transfer; no new progress regarding disposition unfortunately   All of the above findings and recommendations were discussed with the patient, and the medical team, and all of patient's questions were answered to her expressed satisfaction.  -- [name], PA-C [facility] 12/17/2022, 7:44 AM M-F: 7am - 4pm

## 2022-12-17 NOTE — Progress Notes (Signed)
PHARMACY - TOTAL PARENTERAL NUTRITION CONSULT NOTE   Indication: Prolonged ileus  Patient Measurements: Height: 4\' 11"  (149.9 cm) Weight: 102.2 kg (225 lb 5 oz) IBW/kg (Calculated) : 43.2 TPN AdjBW (KG): 55.2 Body mass index is 45.51 kg/m.  Assessment: Debra [name] is a 57 y.o. female s/p laparotomy, excision of greater omental mass, abdominal wall reconstruction with [name], appendectomy, and placement of Prevena vac.  Glucose / Insulin:  --No apparent history of diabetes --BG controlled on current regimen.  --rSSI 4x/day (0500, 1300, 1800, 2200) --SSI last 24h: 2 units  --BG/24h: 111 - 142 --8 units insulin in TPN Electrolytes: Within normal limits Renal: SCr stable at baseline  Hepatic: Liver function within normal limits GI Imaging: 9/11 CTAP: no new acute issues GI Surgeries / Procedures: s/p laparotomy, excision of greater omental mass, abdominal wall reconstruction with [name], appendectomy, and placement of Prevena vac  Central access: 02/15/22 TPN start date: 02/15/22  Nutritional Goals: Goal cyclic TPN over 16 hrs: cyclic TPN over 16 hrs: (provides 102 g of protein and 1811 kcals per day) total volume over 16hrs for calculations=1400 ml (1500 ml total with overfill)  RD Assessment:  Estimated Needs Total Energy Estimated Needs: 1800-2100kcal/day Total Protein Estimated Needs: 90-110g/d Total Fluid Estimated Needs: 1.4-1.6L/day  Current Nutrition:  Soft diet + nutritional supplements, not meeting PO needs yet   Plan: Transitioned to *Cyclic* TPN on 0/25/42.   to run over 16 hours per MD request. [name] at 2000 per discussion with dietician to allow patient time for walking in afternoon/evenings.  To run over 16 hours: -Start rate at 49 mL/hr for 1 hour. -Increase rate to 98 mL/hr for 13 hours.  -Decrease rate to 49 mL/hr for 1 hour. -Decrease rate to 25 mL/hr for 1 hour, then stop.  Plan:  See cyclic TPN rate  above Nutritional Components Amino acids (using 15% Clinisol): 101 g Dextrose 19% = 266 g Lipids (using 20% SMOFlipids): 50.4 g kCal: 1817/24h  Electrolytes in TPN: Na 70 mEq/L, K 22mEq/L, Ca 62mEq/L, Mg 10 mEq/L, Phos 10 mmol/L  Continue ratio Cl:Ac 1:1  Continue trace elements, MVI, chromium, zinc and selenium per dietary recs (hold today per RD pending check of vitamin / mineral labs) Continue CBG/SSI for Cyclic TPN:  -CBG 2 hrs after cyclic TPN start -CBG during middle of cyclic TPN infusion -CBG 1 hr after cylic TPN stopped -CBG while off TPN Continue SSI insulin at moderate scale and add 8 units of insulin in TPN Check TPN labs on Mondays  [name] 12/17/2022

## 2022-12-18 LAB — GLUCOSE, CAPILLARY
Glucose-Capillary: 126 mg/dL — ABNORMAL HIGH (ref 70–99)
Glucose-Capillary: 133 mg/dL — ABNORMAL HIGH (ref 70–99)
Glucose-Capillary: 152 mg/dL — ABNORMAL HIGH (ref 70–99)
Glucose-Capillary: 92 mg/dL (ref 70–99)

## 2022-12-18 MED ORDER — CEFAZOLIN SODIUM-DEXTROSE 2-4 GM/100ML-% IV SOLN
2.0000 g | Freq: Once | INTRAVENOUS | Status: AC
  Administered 2022-12-19: 2 g via INTRAVENOUS
  Filled 2022-12-18: qty 100

## 2022-12-18 MED ORDER — CHLORHEXIDINE GLUCONATE CLOTH 2 % EX PADS
6.0000 | MEDICATED_PAD | Freq: Every day | CUTANEOUS | Status: DC
  Administered 2022-12-19 – 2022-12-31 (×12): 6 via TOPICAL

## 2022-12-18 NOTE — Progress Notes (Signed)
PHARMACY - TOTAL PARENTERAL NUTRITION CONSULT NOTE   Indication: Prolonged ileus  Patient Measurements: Height: 4\' 11"  (149.9 cm) Weight: 102.2 kg (225 lb 5 oz) IBW/kg (Calculated) : 43.2 TPN AdjBW (KG): 55.2 Body mass index is 45.51 kg/m.  Assessment: Debra [name] is a 57 y.o. female s/p laparotomy, excision of greater omental mass, abdominal wall reconstruction with [name], appendectomy, and placement of Prevena vac.  Glucose / Insulin:  --No apparent history of diabetes --BG controlled on current regimen.  --rSSI 4x/day (0500, 1300, 1800, 2200) --SSI last 24h: 6 units  --BG/24h: 81 - 167 --8 units insulin in TPN Electrolytes: Within normal limits Renal: SCr stable at baseline  Hepatic: Liver function within normal limits GI Imaging: 9/11 CTAP: no new acute issues GI Surgeries / Procedures: s/p laparotomy, excision of greater omental mass, abdominal wall reconstruction with [name], appendectomy, and placement of Prevena vac  Central access: 02/15/22 TPN start date: 02/15/22  Nutritional Goals: Goal cyclic TPN over 16 hrs: cyclic TPN over 16 hrs: (provides 102 g of protein and 1811 kcals per day) total volume over 16hrs for calculations=1400 ml (1500 ml total with overfill)  RD Assessment:  Estimated Needs Total Energy Estimated Needs: 1800-2100kcal/day Total Protein Estimated Needs: 90-110g/d Total Fluid Estimated Needs: 1.4-1.6L/day  Current Nutrition:  Soft diet + nutritional supplements, not meeting PO needs yet   Plan: Transitioned to *Cyclic* TPN on 0/45/40.   to run over 16 hours per MD request. Debra [name] at 2000 per discussion with dietician to allow patient time for walking in afternoon/evenings.  To run over 16 hours: -Start rate at 49 mL/hr for 1 hour. -Increase rate to 98 mL/hr for 13 hours.  -Decrease rate to 49 mL/hr for 1 hour. -Decrease rate to 25 mL/hr for 1 hour, then stop.  Plan:  See cyclic TPN rate  above Nutritional Components Amino acids (using 15% Clinisol): 101 g Dextrose 19% = 266 g Lipids (using 20% SMOFlipids): 50.4 g kCal: 1817/24h  Electrolytes in TPN: Na 70 mEq/L, K 3mEq/L, Ca 76mEq/L, Mg 10 mEq/L, Phos 10 mmol/L  Continue ratio Cl:Ac 1:1  Continue trace elements, MVI, chromium, zinc and selenium per dietary recs (hold today per RD pending check of vitamin / mineral labs) Continue CBG/SSI for Cyclic TPN:  -CBG 2 hrs after cyclic TPN start -CBG during middle of cyclic TPN infusion -CBG 1 hr after cylic TPN stopped -CBG while off TPN Continue SSI insulin at moderate scale and add 8 units of insulin in TPN Check TPN labs on Mondays  **Patient with LUE swelling and warmth noted 4/11 on clinical exam. LUE US performed with findings of "one of the paired left brachial veins in the upper arm is thrombosed, consistent with deep venous thrombosis". DVT associated with PICC line in left basilic which was subsequently removed. IV team was consulted for PICC line placement but this was not able to be performed due to lack of appropriate vessels. A PIV was placed. Plan is for port placement with general surgery, tentatively 4/12. No TPN will be ordered for 4/11. Patient has been placed on D10w maintenance fluids.Debra [name] 12/18/2022

## 2022-12-18 NOTE — Progress Notes (Addendum)
[name] SURGICAL PROGRESS NOTE (cpt [id number])  Hospital Day(s): 308.   Post op day(s): 306 Days Post-Op.   Interval History:  Patient seen and examined LUE [name] was positive for DVT of left brachial branch; PICC removed Unable to get new port in RUE No new issues otherwise No new issues [name] output unmeasured; managing effluent independently On TPN; On Soft diet She is ambulating better; reviewed mobility notes   Review of Systems:  Constitutional: denies fever, chills  HEENT: denies cough or congestion  Respiratory: denies any shortness of breath  Cardiovascular: denies chest pain or palpitations  Gastrointestinal: denied abdominal pain, denies N/V Genitourinary: denies burning with urination or urinary frequency Integumentary: + midline wound (stable)   Vital signs in last 24 hours: [min-max] current  Temp:  [98.5 F (36.9 C)-99.9 F (37.7 C)] 99.1 F (37.3 C) (04/11 0618) Pulse Rate:  [79-97] 95 (04/11 0618) Resp:  [18-20] 18 (04/11 0618) BP: (87-102)/(52-63) 102/63 (04/11 0618) SpO2:  [93 %-96 %] 94 % (04/11 0618)     Height: 4\' 11"  (149.9 cm) Weight: 102.2 kg BMI (Calculated): 43.04   Intake/Output last 2 shifts:  04/10 0701 - 04/11 0700 In: 456.7 [I.V.:456.7] Out: 935 [Stool:935]   Physical Exam:  Constitutional: alert, cooperative and no distress  Respiratory: breathing non-labored at rest  Cardiovascular: regular rate and sinus rhythm  Gastrointestinal: Soft, non-tender, non-distended, no rebound/guarding. Integumentary: Midline wound healing by secondary intention, peritoneum closed; granulating and new skin growing over majority of wound now; there are three stomatized areas visible in the LUQ and LLQ portions of the wound. No active bleeding.   Labs:     Latest Ref Rng & Units 12/16/2022    6:19 AM 12/15/2022    6:03 AM 12/09/2022    5:16 AM  CBC  WBC 4.0 - 10.5 K/uL 3.7  3.5  3.0   Hemoglobin 12.0 - 15.0 g/dL 8.6  7.0  7.6   Hematocrit  36.0 - 46.0 % 26.7  22.7  23.9   Platelets 150 - 400 K/uL 86  80  100       Latest Ref Rng & Units 12/15/2022    6:03 AM 12/08/2022    3:35 AM 12/01/2022    6:25 AM  CMP  Glucose 70 - 99 mg/dL 826  415  830   BUN 6 - 20 mg/dL 22  22  25    Creatinine 0.44 - 1.00 mg/dL 9.40  7.68  0.88   Sodium 135 - 145 mmol/L 135  134  132   Potassium 3.5 - 5.1 mmol/L 4.2  4.0  4.2   Chloride 98 - 111 mmol/L 107  107  104   CO2 22 - 32 mmol/L 24  24  23    Calcium 8.9 - 10.3 mg/dL 8.0  8.1  8.3   Total Protein 6.5 - 8.1 g/dL 5.8  5.8  5.7   Total Bilirubin 0.3 - 1.2 mg/dL 0.5  0.5  0.6   Alkaline Phos 38 - 126 U/L 132  118  122   AST 15 - 41 U/L 40  35  34   ALT 0 - 44 U/L 32  28  29     Imaging studies: No new pertinent imaging studies this AM   Assessment/Plan:  57 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  - New 04/11:  - PICC in LUE removed on 04/10 secondary to DVT. Did discuss with vascular surgery and recommended holding on anticoagulation for now, especially given her intermittent bleeding from midline wound. Will reassess with [name] on 04/14, if DVT is more proximal, then would need anticoagulation and can consult vascular formally.  - Unfortunately, new PICC in TUE could not be obtained do to access limitations. As such, we will plan for port insertion on Friday (04/12) with Dr Everlene Farrier. - With help of spanish-english interpreter all risks, benefits, and alternatives to above procedure(s) were discussed with the patient, all of her questions were answered to her expressed satisfaction, patient expresses she wishes to proceed, and informed consent was obtained.  - Will hold TPN tonight again given lack of access; continue D10. Should be  okay to resume nightly TPN tomorrow (04/12).    - Wound Care ([name] Pouch); Now on smaller [name] pouch; continues managing effluent independently. Intermittent oozing from time to time. Change as needed.    - Continue soft diet as tolerated  - Continue cyclic TPN; weekly nutritional labs - Appreciate dietary assistance              - Monitor abdominal examination; on-going bowel function            - Pain control prn; antemetic prn            - Progressed with therapies; no longer any recommendations; ambulating well    - Appreciate neurosurgery assistance with compression fracture; serial XRs   - Discharge Planning: Continue to anticipate lengthy admission +/- possible transfer; no new progress regarding disposition unfortunately   All of the above findings and recommendations were discussed with the patient, and the medical team, and all of patient's questions were answered to her expressed satisfaction.  -- [name], PA-C [facility] 12/18/2022, 8:18 AM M-F: 7am - 4pm

## 2022-12-18 NOTE — Progress Notes (Signed)
Hospital interpreter Jacqui L assisted with consent for port a cath placement

## 2022-12-19 ENCOUNTER — Inpatient Hospital Stay: Payer: Medicaid Other | Admitting: Anesthesiology

## 2022-12-19 ENCOUNTER — Encounter: Admission: RE | Disposition: E | Payer: Self-pay | Source: Home / Self Care | Attending: Surgery

## 2022-12-19 ENCOUNTER — Inpatient Hospital Stay: Payer: Medicaid Other

## 2022-12-19 ENCOUNTER — Other Ambulatory Visit: Payer: Self-pay

## 2022-12-19 HISTORY — PX: PORTACATH PLACEMENT: SHX2246

## 2022-12-19 LAB — FOLATE: Folate: 29 ng/mL (ref >5.9)

## 2022-12-19 LAB — HEMOGLOBIN AND HEMATOCRIT, BLOOD
HCT: 25.2 % — ABNORMAL LOW (ref 36.0–46.0)
Hemoglobin: 8.2 g/dL — ABNORMAL LOW (ref 12.0–15.0)

## 2022-12-19 LAB — GLUCOSE, CAPILLARY
Glucose-Capillary: 151 mg/dL — ABNORMAL HIGH (ref 70–99)
Glucose-Capillary: 200 mg/dL — ABNORMAL HIGH (ref 70–99)

## 2022-12-19 LAB — IRON AND TIBC
Iron: 25 ug/dL — ABNORMAL LOW (ref 28–170)
Saturation Ratios: 6 % — ABNORMAL LOW (ref 10.4–31.8)
TIBC: 398 ug/dL (ref 250–450)
UIBC: 373 ug/dL

## 2022-12-19 LAB — FERRITIN: Ferritin: 46 ng/mL (ref 11–307)

## 2022-12-19 LAB — VITAMIN B12: Vitamin B-12: 922 pg/mL — ABNORMAL HIGH (ref 180–914)

## 2022-12-19 LAB — VITAMIN D 25 HYDROXY (VIT D DEFICIENCY, FRACTURES): Vit D, 25-Hydroxy: 74.99 ng/mL (ref 30–100)

## 2022-12-19 SURGERY — INSERTION, TUNNELED CENTRAL VENOUS DEVICE, WITH PORT
Anesthesia: General | Site: Chest

## 2022-12-19 MED ORDER — FENTANYL CITRATE (PF) 100 MCG/2ML IJ SOLN
INTRAMUSCULAR | Status: AC
  Filled 2022-12-19: qty 2

## 2022-12-19 MED ORDER — PHENYLEPHRINE 80 MCG/ML (10ML) SYRINGE FOR IV PUSH (FOR BLOOD PRESSURE SUPPORT)
PREFILLED_SYRINGE | INTRAVENOUS | Status: AC
  Filled 2022-12-19: qty 10

## 2022-12-19 MED ORDER — PROPOFOL 10 MG/ML IV BOLUS
INTRAVENOUS | Status: AC
  Filled 2022-12-19: qty 40

## 2022-12-19 MED ORDER — ACETAMINOPHEN 10 MG/ML IV SOLN
INTRAVENOUS | Status: DC | PRN
  Administered 2022-12-19: 1000 mg via INTRAVENOUS

## 2022-12-19 MED ORDER — BUPIVACAINE-EPINEPHRINE (PF) 0.25% -1:200000 IJ SOLN
INTRAMUSCULAR | Status: DC | PRN
  Administered 2022-12-19: 10 mL

## 2022-12-19 MED ORDER — SODIUM CHLORIDE 0.9% FLUSH
INTRAVENOUS | Status: DC | PRN
  Administered 2022-12-19: 20 mL via INTRAVENOUS

## 2022-12-19 MED ORDER — FENTANYL CITRATE (PF) 100 MCG/2ML IJ SOLN
INTRAMUSCULAR | Status: DC | PRN
  Administered 2022-12-19 (×4): 25 ug via INTRAVENOUS

## 2022-12-19 MED ORDER — MIDAZOLAM HCL 2 MG/2ML IJ SOLN
INTRAMUSCULAR | Status: DC | PRN
  Administered 2022-12-19 (×2): 1 mg via INTRAVENOUS

## 2022-12-19 MED ORDER — BUPIVACAINE HCL (PF) 0.25 % IJ SOLN
INTRAMUSCULAR | Status: AC
  Filled 2022-12-19: qty 30

## 2022-12-19 MED ORDER — SODIUM CHLORIDE 0.9 % IV SOLN
INTRAVENOUS | Status: DC | PRN
  Administered 2022-12-19: 4 mL via INTRAVENOUS

## 2022-12-19 MED ORDER — ZINC CHLORIDE 1 MG/ML IV SOLN
INTRAVENOUS | Status: AC
  Filled 2022-12-19: qty 672

## 2022-12-19 MED ORDER — OXYCODONE HCL 5 MG/5ML PO SOLN: 5.0000 mg | Freq: Once | ORAL | Status: DC | PRN

## 2022-12-19 MED ORDER — LACTATED RINGERS IV SOLN: INTRAVENOUS | Status: DC | PRN

## 2022-12-19 MED ORDER — HEPARIN SODIUM (PORCINE) 5000 UNIT/ML IJ SOLN
INTRAMUSCULAR | Status: AC
  Filled 2022-12-19: qty 1

## 2022-12-19 MED ORDER — ACETAMINOPHEN 10 MG/ML IV SOLN
INTRAVENOUS | Status: AC
  Filled 2022-12-19: qty 100

## 2022-12-19 MED ORDER — LIDOCAINE HCL (PF) 1 % IJ SOLN
INTRAMUSCULAR | Status: DC | PRN
  Administered 2022-12-19: 10 mL

## 2022-12-19 MED ORDER — CHLORHEXIDINE GLUCONATE 0.12 % MT SOLN
OROMUCOSAL | Status: AC
  Filled 2022-12-19: qty 15

## 2022-12-19 MED ORDER — PROPOFOL 10 MG/ML IV BOLUS
INTRAVENOUS | Status: DC | PRN
  Administered 2022-12-19: 100 mg via INTRAVENOUS

## 2022-12-19 MED ORDER — SODIUM CHLORIDE (PF) 0.9 % IJ SOLN
INTRAMUSCULAR | Status: AC
  Filled 2022-12-19: qty 10

## 2022-12-19 MED ORDER — GLYCOPYRROLATE 0.2 MG/ML IJ SOLN
INTRAMUSCULAR | Status: DC | PRN
  Administered 2022-12-19: .2 mg via INTRAVENOUS

## 2022-12-19 MED ORDER — FENTANYL CITRATE (PF) 100 MCG/2ML IJ SOLN: 25.0000 ug | INTRAMUSCULAR | Status: DC | PRN

## 2022-12-19 MED ORDER — MIDAZOLAM HCL 2 MG/2ML IJ SOLN
INTRAMUSCULAR | Status: AC
  Filled 2022-12-19: qty 2

## 2022-12-19 MED ORDER — CEFAZOLIN SODIUM-DEXTROSE 2-4 GM/100ML-% IV SOLN
INTRAVENOUS | Status: AC
  Filled 2022-12-19: qty 100

## 2022-12-19 MED ORDER — DEXAMETHASONE SODIUM PHOSPHATE 10 MG/ML IJ SOLN
INTRAMUSCULAR | Status: AC
  Filled 2022-12-19: qty 1

## 2022-12-19 MED ORDER — LIDOCAINE HCL (PF) 1 % IJ SOLN
INTRAMUSCULAR | Status: AC
  Filled 2022-12-19: qty 30

## 2022-12-19 MED ORDER — CHLORHEXIDINE GLUCONATE 0.12 % MT SOLN
15.0000 mL | Freq: Once | OROMUCOSAL | Status: AC
  Administered 2022-12-19: 15 mL via OROMUCOSAL

## 2022-12-19 MED ORDER — LIDOCAINE HCL (PF) 2 % IJ SOLN
INTRAMUSCULAR | Status: AC
  Filled 2022-12-19: qty 5

## 2022-12-19 MED ORDER — ONDANSETRON HCL 4 MG/2ML IJ SOLN
INTRAMUSCULAR | Status: AC
  Filled 2022-12-19: qty 2

## 2022-12-19 MED ORDER — PHENYLEPHRINE HCL (PRESSORS) 10 MG/ML IV SOLN
INTRAVENOUS | Status: DC | PRN
  Administered 2022-12-19 (×2): 160 ug via INTRAVENOUS

## 2022-12-19 MED ORDER — EPINEPHRINE PF 1 MG/ML IJ SOLN
INTRAMUSCULAR | Status: AC
  Filled 2022-12-19: qty 1

## 2022-12-19 MED ORDER — OXYCODONE HCL 5 MG PO TABS: 5.0000 mg | ORAL_TABLET | Freq: Once | ORAL | Status: DC | PRN

## 2022-12-19 MED ORDER — ONDANSETRON HCL 4 MG/2ML IJ SOLN
INTRAMUSCULAR | Status: DC | PRN
  Administered 2022-12-19: 4 mg via INTRAVENOUS

## 2022-12-19 MED ORDER — LIDOCAINE HCL (CARDIAC) PF 100 MG/5ML IV SOSY
PREFILLED_SYRINGE | INTRAVENOUS | Status: DC | PRN
  Administered 2022-12-19: 100 mg via INTRAVENOUS

## 2022-12-19 MED ORDER — DEXAMETHASONE SODIUM PHOSPHATE 10 MG/ML IJ SOLN
INTRAMUSCULAR | Status: DC | PRN
  Administered 2022-12-19: 5 mg via INTRAVENOUS

## 2022-12-19 SURGICAL SUPPLY — 43 items
ADH SKN CLS APL DERMABOND .7 (GAUZE/BANDAGES/DRESSINGS) ×2
APL PRP STRL LF DISP 70% ISPRP (MISCELLANEOUS) ×2
BAG DECANTER FOR FLEXI CONT (MISCELLANEOUS) ×2 IMPLANT
BLADE SURG SZ11 CARB STEEL (BLADE) ×1 IMPLANT
BOOT SUTURE VASCULAR YLW (MISCELLANEOUS) ×1
CHLORAPREP W/TINT 26 (MISCELLANEOUS) ×1 IMPLANT
CLAMP SUTURE YELLOW 5 PAIRS (MISCELLANEOUS) ×1 IMPLANT
COVER LIGHT HANDLE STERIS (MISCELLANEOUS) ×2 IMPLANT
DERMABOND ADVANCED .7 DNX12 (GAUZE/BANDAGES/DRESSINGS) ×1 IMPLANT
DRAPE 3/4 80X56 (DRAPES) ×1 IMPLANT
DRAPE C-ARM XRAY 36X54 (DRAPES) ×2 IMPLANT
DRAPE INCISE IOBAN 66X45 STRL (DRAPES) ×1 IMPLANT
ELECT CAUTERY BLADE 6.4 (BLADE) ×1 IMPLANT
ELECT REM PT RETURN 9FT ADLT (ELECTROSURGICAL) ×1
ELECTRODE REM PT RTRN 9FT ADLT (ELECTROSURGICAL) ×1 IMPLANT
GEL ULTRASOUND 20GR AQUASONIC (MISCELLANEOUS) ×1 IMPLANT
GLOVE BIO SURGEON STRL SZ7 (GLOVE) ×1 IMPLANT
GOWN STRL REUS W/ TWL LRG LVL3 (GOWN DISPOSABLE) ×2 IMPLANT
GOWN STRL REUS W/TWL LRG LVL3 (GOWN DISPOSABLE) ×4
IV NS 500ML (IV SOLUTION) ×1
IV NS 500ML BAXH (IV SOLUTION) ×1 IMPLANT
KIT PORT POWER CATH 9.5FR DUO (Port) IMPLANT
MANIFOLD NEPTUNE II (INSTRUMENTS) ×1 IMPLANT
NDL HYPO 22X1.5 SAFETY MO (MISCELLANEOUS) ×1 IMPLANT
NEEDLE HYPO 22X1.5 SAFETY MO (MISCELLANEOUS) ×1 IMPLANT
PACK PORT-A-CATH (MISCELLANEOUS) ×1 IMPLANT
SPONGE T-LAP 18X18 ~~LOC~~+RFID (SPONGE) ×1 IMPLANT
SUT MNCRL 4-0 (SUTURE) ×1
SUT MNCRL 4-0 27XMFL (SUTURE) ×1
SUT MNCRL AB 4-0 PS2 18 (SUTURE) ×1 IMPLANT
SUT PROLENE 2-0 (SUTURE) ×1
SUT PROLENE 2-0 RB1 36X2 ARM (SUTURE) ×1
SUT PROLENE 7 0 BV 1 (SUTURE) IMPLANT
SUT VIC AB 3-0 SH 27 (SUTURE) ×1
SUT VIC AB 3-0 SH 27X BRD (SUTURE) ×1 IMPLANT
SUTURE MNCRL 4-0 27XMF (SUTURE) IMPLANT
SUTURE PROLEN 2-0 RB1 36X2 ARM (SUTURE) ×1 IMPLANT
SYR 10ML LL (SYRINGE) ×1 IMPLANT
SYR 20ML LL LF (SYRINGE) ×1 IMPLANT
SYR 5ML LL (SYRINGE) ×1 IMPLANT
TAG SUTURE CLAMP YLW 5PR (MISCELLANEOUS) ×1
TOWEL OR 17X26 4PK STRL BLUE (TOWEL DISPOSABLE) ×1 IMPLANT
TRAP FLUID SMOKE EVACUATOR (MISCELLANEOUS) ×1 IMPLANT

## 2022-12-19 NOTE — Anesthesia Procedure Notes (Signed)
Procedure Name: LMA Insertion Date/Time: 12/19/2022 11:20 AM  Performed by: Omer Jack, CRNAPre-anesthesia Checklist: Patient identified, Patient being monitored, Timeout performed, Emergency Drugs available and Suction available Patient Re-evaluated:Patient Re-evaluated prior to induction Oxygen Delivery Method: Circle system utilized Preoxygenation: Pre-oxygenation with 100% oxygen Induction Type: IV induction Ventilation: Mask ventilation without difficulty LMA: LMA inserted LMA Size: 3.0 Tube type: Oral Number of attempts: 1 Placement Confirmation: positive ETCO2 and breath sounds checked- equal and bilateral Tube secured with: Tape Dental Injury: Teeth and Oropharynx as per pre-operative assessment

## 2022-12-19 NOTE — Transfer of Care (Signed)
Immediate Anesthesia Transfer of Care Note  Patient: Debra [name]  Procedure(s) Performed: INSERTION PORT-A-CATH (Chest)  Patient Location: PACU  Anesthesia Type:General  Level of Consciousness: drowsy and patient cooperative  Airway & Oxygen Therapy: Patient Spontanous Breathing and Patient connected to face mask oxygen  Post-op Assessment: Report given to RN and Post -op Vital signs reviewed and stable  Post vital signs: Reviewed and stable  Last Vitals:  Vitals Value Taken Time  BP 108/63 12/19/22 1316  Temp 36.3 C 12/19/22 1308  Pulse 79 12/19/22 1317  Resp 15 12/19/22 1317  SpO2 100 % 12/19/22 1317  Vitals shown include unvalidated device data.  Last Pain:  Vitals:   12/19/22 0953  TempSrc: Temporal  PainSc: 0-No pain      Patients Stated Pain Goal: 2 (11/22/22 1000)  Complications: No notable events documented.

## 2022-12-19 NOTE — Anesthesia Postprocedure Evaluation (Signed)
Anesthesia Post Note  Patient: Debra [name]  Procedure(s) Performed: INSERTION PORT-A-CATH (Chest)  Patient location during evaluation: PACU Anesthesia Type: General Level of consciousness: awake and alert Pain management: pain level controlled Vital Signs Assessment: post-procedure vital signs reviewed and stable Respiratory status: spontaneous breathing, nonlabored ventilation, respiratory function stable and patient connected to nasal cannula oxygen Cardiovascular status: blood pressure returned to baseline and stable Postop Assessment: no apparent nausea or vomiting Anesthetic complications: no   No notable events documented.   Last Vitals:  Vitals:   12/19/22 1315 12/19/22 1330  BP: 108/63 105/65  Pulse: 79 79  Resp: 13 20  Temp:    SpO2: 100% 98%    Last Pain:  Vitals:   12/19/22 1330  TempSrc:   PainSc: 0-No pain                 Corinda Gubler

## 2022-12-19 NOTE — Anesthesia Preprocedure Evaluation (Addendum)
Anesthesia Evaluation  Patient identified by MRN, date of birth, ID band Patient awake    Reviewed: Allergy & Precautions, NPO status , Patient's Chart, lab work & pertinent test results  History of Anesthesia Complications Negative for: history of anesthetic complications  Airway Mallampati: III  TM Distance: <3 FB Neck ROM: full    Dental  (+) Implants   Pulmonary neg pulmonary ROS, neg shortness of breath   Pulmonary exam normal        Cardiovascular Exercise Tolerance: Good negative cardio ROS Normal cardiovascular exam     Neuro/Psych negative neurological ROS  negative psych ROS   GI/Hepatic negative GI ROS, Neg liver ROS,,,  Endo/Other  Hypothyroidism    Renal/GU      Musculoskeletal   Abdominal   Peds  Hematology negative hematology ROS (+)   Anesthesia Other Findings Past Medical History: No date: Anemia 04/2020: Colon cancer (HCC) No date: History of kidney stones No date: Hypothyroidism No date: Thyroid disease  Past Surgical History: 02/13/2022: ABDOMINAL WALL DEFECT REPAIR; N/A     Comment:  Procedure: REPAIR ABDOMINAL WALL;  Surgeon: Leafy Ro, MD;  Location: [facility];  Service: General;                Laterality: N/A; 02/13/2022: APPENDECTOMY; N/A     Comment:  Procedure: APPENDECTOMY;  Surgeon: Leafy Ro, MD;                Location: [facility];  Service: General;  Laterality: N/A; 02/15/2022: APPLICATION OF WOUND VAC; N/A     Comment:  Procedure: APPLICATION OF WOUND VAC;  Surgeon: Leafy Ro, MD;  Location: [facility];  Service: General;                Laterality: N/A;  [id number] 02/13/2022: COLON RESECTION SIGMOID; N/A     Comment:  Procedure: COLON RESECTION SIGMOID, open, [name], PA-C to assist;  Surgeon: Leafy Ro, MD;                Location: [facility];  Service: General;  Laterality: N/A; 11/21/2019: COLONOSCOPY WITH  PROPOFOL; N/A     Comment:  Procedure: COLONOSCOPY WITH PROPOFOL;  Surgeon: Toney Reil, MD;  Location: [facility];  Service:               Gastroenterology;  Laterality: N/A; 07/10/2021: COLONOSCOPY WITH PROPOFOL; N/A     Comment:  Procedure: COLONOSCOPY WITH PROPOFOL;  Surgeon: Toney Reil, MD;  Location: [facility];  Service:               Gastroenterology;  Laterality: N/A; 02/06/2022: COLONOSCOPY WITH PROPOFOL; N/A     Comment:  Procedure: COLONOSCOPY WITH PROPOFOL;  Surgeon: Toney Reil, MD;  Location: [facility];  Service:               Gastroenterology;  Laterality: N/A;  SPANISH 01/30/2020: CYSTOSCOPY WITH STENT PLACEMENT; Left     Comment:  Procedure: CYSTOSCOPY WITH STENT PLACEMENT;  Surgeon:  Vanna Scotland, MD;  Location: [facility];  Service:               Urology;  Laterality: Left; 11/21/2019: ESOPHAGOGASTRODUODENOSCOPY (EGD) WITH PROPOFOL; N/A     Comment:  Procedure: ESOPHAGOGASTRODUODENOSCOPY (EGD) WITH               PROPOFOL;  Surgeon: Toney Reil, MD;  Location:               [facility];  Service: Gastroenterology;  Laterality:               N/A; 02/13/2022: INSERTION OF MESH; N/A     Comment:  Procedure: INSERTION OF MESH;  Surgeon: Leafy Ro,               MD;  Location: [facility];  Service: General;  Laterality:               N/A; 02/15/2022: INSERTION OF MESH; N/A     Comment:  Procedure: INSERTION OF MESH;  Surgeon: Leafy Ro,               MD;  Location: [facility];  Service: General;  Laterality:               N/A; 07/03/2020: IR RADIOLOGIST EVAL & MGMT 07/12/2020: IR RADIOLOGIST EVAL & MGMT 02/15/2022: LAPAROTOMY; N/A     Comment:  Procedure: EXPLORATORY LAPAROTOMY REPAIR OF BOWEL               PERFORATION;  Surgeon: Leafy Ro, MD;  Location:               [facility];  Service: General;  Laterality: N/A; 01/30/2020: PARTIAL COLECTOMY; N/A     Comment:   Procedure: PARTIAL COLECTOMY Sigmoid;  Surgeon: Duanne Guess, MD;  Location: [facility];  Service: General;                Laterality: N/A; 03/28/2020: PORTACATH PLACEMENT; Left     Comment:  Procedure: INSERTION PORT-A-CATH;  Surgeon: Duanne Guess, MD;  Location: [facility];  Service: General;                Laterality: Left; 08/29/2020: PORTACATH PLACEMENT; N/A     Comment:  Procedure: INSERTION PORT-A-CATH, chemotherapy;                Surgeon: Duanne Guess, MD;  Location: [facility];                Service: General;  Laterality: N/A; 02/13/2022: VENTRAL HERNIA REPAIR; N/A     Comment:  Procedure: HERNIA REPAIR VENTRAL ADULT, open, [name], PA-C to assist;  Surgeon: Leafy Ro, MD;                Location: [facility];  Service: General;  Laterality: N/A;  BMI    Body Mass Index: 45.28 kg/m      Reproductive/Obstetrics negative OB ROS                             Anesthesia Physical Anesthesia Plan  ASA: 3  Anesthesia Plan: General LMA   Post-op Pain Management:    Induction: Intravenous  PONV  Risk Score and Plan: Dexamethasone, Ondansetron, Midazolam and Treatment may vary due to age or medical condition  Airway Management Planned: LMA  Additional Equipment:   Intra-op Plan:   Post-operative Plan: Extubation in OR  Informed Consent: I have reviewed the patients History and Physical, chart, labs and discussed the procedure including the risks, benefits and alternatives for the proposed anesthesia with the patient or authorized representative who has indicated his/her understanding and acceptance.     Dental Advisory Given and Interpreter used for interveiw  Plan Discussed with: Anesthesiologist, CRNA and Surgeon  Anesthesia Plan Comments: (Patient consented for risks of anesthesia including but not limited to:  - adverse reactions to medications - damage to eyes, teeth, lips or other  oral mucosa - nerve damage due to positioning  - sore throat or hoarseness - Damage to heart, brain, nerves, lungs, other parts of body or loss of life  Patient voiced understanding.)       Anesthesia Quick Evaluation

## 2022-12-19 NOTE — Op Note (Signed)
  Pre-operative Diagnosis:EC fistula in need for long term TPN  Post-operative Diagnosis: same   Surgeon: Sterling Big, MD FACS  Anesthesia: IV sedation, marcaine .25% w epi and lidocaine 1%  Procedure: right IJ  Port placement two reservoirs ( power port duo) with fluoroscopy under U/S guidance  Findings: Good position of the tip of the catheter by fluoroscopy  Estimated Blood Loss: Minimal         Drains: None         Specimens: None       Complications: none            Procedure Details  The patient was seen again in the Holding Room. The benefits, complications, treatment options, and expected outcomes were discussed with the patient. The risks of bleeding, infection, recurrence of symptoms, failure to resolve symptoms,  thrombosis nonfunction breakage pneumothorax hemopneumothorax any of which could require chest tube or further surgery were reviewed with the patient.   The patient was taken to Operating Room, identified as Crimson Kalich and the procedure verified.  A Time Out was held and the above information confirmed.  Prior to the induction of general anesthesia, antibiotic prophylaxis was administered. VTE prophylaxis was in place. Appropriate anesthesia was then administered and tolerated well. The chest was prepped with Chloraprep and draped in the sterile fashion. The patient was positioned in the supine position. Then the patient was placed in Trendelenburg position.  Patient was prepped and draped in sterile fashion and in a Trendelenburg position local anesthetic was infiltrated into the skin and subcutaneous tissues in the neck and anterior chest wall. The large bore needle was placed into the internal jugular vein under U/S guidance without difficulty and then the Seldinger wire was advanced. Fluoroscopy was utilized to confirm that the Seldinger wire was in the superior vena cava.  An incision was made and a port pocket developed with blunt and electrocautery  dissection. The introducer dilator was placed over the Seldinger wire the wire was removed. The previously flushed catheter was placed into the introducer dilator and the peel-away sheath was removed. The catheter length was confirmed and trimmed utilizing fluoroscopy for proper positioning. The catheter was then attached to the previously flushed port. The port was placed into the pocket. The port was held in with 2-0 Prolenes and flushed for function and heparin locked.  The wound was closed with interrupted 3-0 Vicryl followed by 4-0 subcuticular Monocryl sutures. Dermabond used to coat the skin  Patient was taken to the recovery room in stable condition where a postoperative chest film has been ordered.

## 2022-12-19 NOTE — Progress Notes (Signed)
[facility] SURGICAL PROGRESS NOTE  Hospital Day(s): 309.   Post op day(s): 307 Days Post-Op.   Interval History:  Patient seen and examined Did have some oozing overnight; stopped with pressure No new issue otherwise  [name] output unmeasured; managing effluent independently On TPN; On Soft diet She is ambulating better; reviewed mobility notes   Review of Systems:  Constitutional: denies fever, chills  HEENT: denies cough or congestion  Respiratory: denies any shortness of breath  Cardiovascular: denies chest pain or palpitations  Gastrointestinal: denied abdominal pain, denies N/V Genitourinary: denies burning with urination or urinary frequency Integumentary: + midline wound (stable)   Vital signs in last 24 hours: [min-max] current  Temp:  [97.6 F (36.4 C)-98.5 F (36.9 C)] 98.5 F (36.9 C) (04/12 0736) Pulse Rate:  [74-90] 90 (04/12 0736) Resp:  [16-20] 16 (04/12 0736) BP: (88-108)/(60-72) 88/61 (04/12 0736) SpO2:  [94 %-99 %] 94 % (04/12 0736) Weight:  [101.7 kg] 101.7 kg (04/12 0500)     Height:  (149.9 cm) Weight: 101.7 kg BMI (Calculated): 43.04   Intake/Output last 2 shifts:  04/11 0701 - 04/12 0700 In: 941.7 [P.O.:300; I.V.:641.7] Out: 700 [Drains:300; Stool:400]   Physical Exam:  Constitutional: alert, cooperative and no distress  Respiratory: breathing non-labored at rest  Cardiovascular: regular rate and sinus rhythm  Gastrointestinal: Soft, non-tender, non-distended, no rebound/guarding. Integumentary: Midline wound healing by secondary intention, peritoneum closed; granulating and new skin growing over majority of wound now; there are three stomatized areas visible in the LUQ and LLQ portions of the wound. No active bleeding.   Labs:     Latest Ref Rng & Units 12/16/2022    6:19 AM 12/15/2022    6:03 AM 12/09/2022    5:16 AM  CBC  WBC 4.0 - 10.5 K/uL 3.7  3.5  3.0   Hemoglobin 12.0 - 15.0 g/dL 8.6  7.0  7.6   Hematocrit 36.0  - 46.0 % 26.7  22.7  23.9   Platelets 150 - 400 K/uL 86  80  100       Latest Ref Rng & Units 12/15/2022    6:03 AM 12/08/2022    3:35 AM 12/01/2022    6:25 AM  CMP  Glucose 70 - 99 mg/dL 045  409  811   BUN 6 - 20 mg/dL Creatinine 0.44 - 1.00 mg/dL 9.14  7.82  9.56   Sodium 135 - 145 mmol/L 135  134  132   Potassium 3.5 - 5.1 mmol/L 4.2  4.0  4.2   Chloride 98 - 111 mmol/L 107  107  104   CO2 22 - 32 mmol/L Calcium 8.9 - 10.3 mg/dL 8.0  8.1  8.3   Total Protein 6.5 - 8.1 g/dL 5.8  5.8  5.7   Total Bilirubin 0.3 - 1.2 mg/dL 0.5  0.5  0.6   Alkaline Phos 38 - 126 U/L 132  118  122   AST 15 - 41 U/L 40  35  34   ALT 0 - 44 U/L 32  28  29     Imaging studies: No new pertinent imaging studies this AM   Assessment/Plan:  57 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  - New 04/12:  - Plan for port insertion today with Dr Everlene Farrier. - Yesterday, with help of spanish-english interpreter all risks, benefits, and alternatives to above procedure(s) were discussed with the patient, all of her questions were answered to her expressed satisfaction, patient expresses she wishes to proceed, and informed consent was obtained.  - Plan to restart TPN tonight after port placed   - Wound Care ([name] Pouch); Now on smaller [name] pouch; continues managing effluent independently. Intermittent oozing from time to time. Change as needed.    - Continue soft diet as tolerated  - Continue cyclic TPN; weekly nutritional labs - Appreciate dietary assistance              - Monitor abdominal examination; on-going bowel function            - Pain control prn; antemetic prn            - Progressed with therapies; no longer any  recommendations; ambulating well    - Appreciate neurosurgery assistance with compression fracture; serial XRs   - Discharge Planning: Continue to anticipate lengthy admission +/- possible transfer; no new progress regarding disposition unfortunately   All of the above findings and recommendations were discussed with the patient, and the medical team, and all of patient's questions were answered to her expressed satisfaction.  -- [name], PA-C  [facility] 12/19/2022, 9:02 AM M-F: 7am - 4pm

## 2022-12-19 NOTE — Progress Notes (Signed)
Patient received from PACU. Eyes open and patient is responsive. Bruising noted to the right neck and chest wall. Incisions x 2 to right chest and one to the neck. Vascular access team consulted to access [facility] a Cath.   [name] [name]

## 2022-12-19 NOTE — Progress Notes (Signed)
Port a cath accessed by vascular access team. Extensive labwork obtained from port a cath and sent to the lab.   Debra [name]

## 2022-12-19 NOTE — Progress Notes (Signed)
PHARMACY - TOTAL PARENTERAL NUTRITION CONSULT NOTE   Indication: Prolonged ileus  Patient Measurements: Height: 4\' 11"  (149.9 cm) Weight: 101.7 kg (224 lb 3.3 oz) IBW/kg (Calculated) : 43.2 TPN AdjBW (KG): 55.2 Body mass index is 45.28 kg/m.  Assessment: Debra [name] is a 57 y.o. female s/p laparotomy, excision of greater omental mass, abdominal wall reconstruction with [name], appendectomy, and placement of Prevena vac.  Glucose / Insulin:  --No apparent history of diabetes --BG controlled on current regimen.  --rSSI 4x/day (0500, 1300, 1800, 2200) --SSI last 24h: 7 units  --BG/24h: 92 - 152 --8 units insulin in TPN Electrolytes: Within normal limits Renal: SCr stable at baseline  Hepatic: Liver function within normal limits GI Imaging: 9/11 CTAP: no new acute issues GI Surgeries / Procedures: s/p laparotomy, excision of greater omental mass, abdominal wall reconstruction with [name], appendectomy, and placement of Prevena vac  Central access: 02/15/22 TPN start date: 02/15/22  Nutritional Goals: Goal cyclic TPN over 16 hrs: cyclic TPN over 16 hrs: (provides 102 g of protein and 1811 kcals per day) total volume over 16hrs for calculations=1400 ml (1500 ml total with overfill)  RD Assessment:  Estimated Needs Total Energy Estimated Needs: 1800-2100kcal/day Total Protein Estimated Needs: 90-110g/d Total Fluid Estimated Needs: 1.4-1.6L/day  Current Nutrition:  Soft diet + nutritional supplements, not meeting PO needs yet   Plan: Transitioned to *Cyclic* TPN on 09/24/55.   to run over 16 hours per MD request. [name] at 2000 per discussion with dietician to allow patient time for walking in afternoon/evenings.  To run over 16 hours: -Start rate at 49 mL/hr for 1 hour. -Increase rate to 98 mL/hr for 13 hours.  -Decrease rate to 49 mL/hr for 1 hour. -Decrease rate to 25 mL/hr for 1 hour, then stop.  Plan:  See cyclic TPN rate  above Nutritional Components Amino acids (using 15% Clinisol): 101 g Dextrose 19% = 266 g Lipids (using 20% SMOFlipids): 50.4 g kCal: 1817/24h  Electrolytes in TPN: Na 70 mEq/L, K 46mEq/L, Ca 60mEq/L, Mg 10 mEq/L, Phos 10 mmol/L  Continue ratio Cl:Ac 1:1  Continue trace elements, MVI, chromium, zinc and selenium per dietary recs (hold today per RD pending check of vitamin / mineral labs) Continue CBG/SSI for Cyclic TPN:  -CBG 2 hrs after cyclic TPN start -CBG during middle of cyclic TPN infusion -CBG 1 hr after cylic TPN stopped -CBG while off TPN Continue SSI insulin at moderate scale and add 8 units of insulin in TPN Check TPN labs on Mondays  4/10 - 4/11: Patient with LUE swelling and warmth noted 4/10 on clinical exam. LUE US performed with findings of "one of the paired left brachial veins in the upper arm is thrombosed, consistent with deep venous thrombosis". DVT associated with PICC line in left basilic which was subsequently removed. IV team was consulted for PICC line placement but this was not able to be performed due to lack of appropriate vessels. A PIV was placed. Plan is for port placement with general surgery, tentatively 4/12. No TPN will be ordered for 4/11. Patient has been placed on D10w maintenance fluids.** 4/12: Plan for port placement for continued TPN. Patient on OR schedule for 1106. Plan to resume TPN 4/12 at 2000, stop D10w when TPN re-started. RD to check vitamin and mineral labs.  Debra [name] 12/19/2022

## 2022-12-20 ENCOUNTER — Inpatient Hospital Stay: Payer: Medicaid Other

## 2022-12-20 MED ORDER — ZINC CHLORIDE 1 MG/ML IV SOLN
INTRAVENOUS | Status: AC
  Filled 2022-12-20: qty 672

## 2022-12-20 NOTE — Progress Notes (Signed)
PHARMACY - TOTAL PARENTERAL NUTRITION CONSULT NOTE   Indication: Prolonged ileus  Patient Measurements: Height: 4\' 11"  (149.9 cm) Weight: 101.7 kg (224 lb 3.3 oz) IBW/kg (Calculated) : 43.2 TPN AdjBW (KG): 57.8 Body mass index is 45.28 kg/m.  Assessment: Debra [name] is a 57 y.o. female s/p laparotomy, excision of greater omental mass, abdominal wall reconstruction with [name], appendectomy, and placement of Prevena vac. New DVT and PICC was replaced 4/12.   Glucose / Insulin:  --No apparent history of diabetes --BG controlled on current regimen.  --rSSI 4x/day (0500, 1300, 1800, 2200) --SSI last 24h: 16 units  --BG/24h: 92 - 200 --8 units insulin in TPN Electrolytes: Within normal limits Renal: SCr stable at baseline  Hepatic: Liver function within normal limits GI Imaging: 9/11 CTAP: no new acute issues GI Surgeries / Procedures: s/p laparotomy, excision of greater omental mass, abdominal wall reconstruction with [name], appendectomy, and placement of Prevena vac  Central access: 02/15/22 TPN start date: 02/15/22  Nutritional Goals: Goal cyclic TPN over 16 hrs: cyclic TPN over 16 hrs: (provides 102 g of protein and 1811 kcals per day) total volume over 16hrs for calculations=1400 ml (1500 ml total with overfill)  RD Assessment:  Estimated Needs Total Energy Estimated Needs: 1800-2100kcal/day Total Protein Estimated Needs: 90-110g/d Total Fluid Estimated Needs: 1.4-1.6L/day  Current Nutrition:  Soft diet + nutritional supplements, not meeting PO needs yet   Plan: Transitioned to *Cyclic* TPN on 01/10/38.   to run over 16 hours per MD request. [name] at 2000 per discussion with dietician to allow patient time for walking in afternoon/evenings.  To run over 16 hours: -Start rate at 49 mL/hr for 1 hour. -Increase rate to 98 mL/hr for 13 hours.  -Decrease rate to 49 mL/hr for 1 hour. -Decrease rate to 25 mL/hr for 1 hour, then  stop.  Plan:  See cyclic TPN rate above Nutritional Components Amino acids (using 15% Clinisol): 101 g Dextrose 19% = 266 g Lipids (using 20% SMOFlipids): 50.4 g kCal: 1817/24h  Electrolytes in TPN: Na 70 mEq/L, K 25mEq/L, Ca 42mEq/L, Mg 10 mEq/L, Phos 10 mmol/L  Continue ratio Cl:Ac 1:1  Continue trace elements, MVI, chromium, zinc and selenium per dietary recs (hold today per RD pending check of vitamin / mineral labs) Continue CBG/SSI for Cyclic TPN:  -CBG 2 hrs after cyclic TPN start -CBG during middle of cyclic TPN infusion -CBG 1 hr after cylic TPN stopped -CBG while off TPN Continue SSI insulin at moderate scale and add 8 units of insulin in TPN Check TPN labs on Mondays   Ronnald Ramp 12/20/2022

## 2022-12-20 NOTE — Progress Notes (Signed)
POD # 1 dual port Some swelling on neck that responded to ice Some scant bloody fluid from fistula   PE NAd Neck some edema and post op changes no evidence of tracheal shift, she is frail and bruises easily. No infection , no expanding hematoma I replaced under sterile technique the Heubner needle to the Left reservoir Port site w/o infection  Abd: w eakin pouch no active bleeding, scant marron fluid  A/p Continue tpn and supportive care Ice to neck, some edema from difficult port placement due to short neck and body habitus

## 2022-12-21 LAB — GLUCOSE, CAPILLARY
Glucose-Capillary: 102 mg/dL — ABNORMAL HIGH (ref 70–99)
Glucose-Capillary: 154 mg/dL — ABNORMAL HIGH (ref 70–99)

## 2022-12-21 MED ORDER — ZINC CHLORIDE 1 MG/ML IV SOLN
INTRAVENOUS | Status: AC
  Filled 2022-12-21: qty 672

## 2022-12-21 NOTE — Progress Notes (Signed)
   12/21/22 1700  Spiritual Encounters  Type of Visit Follow up  Care provided to: Patient  Reason for visit Routine spiritual support  OnCall Visit Yes  Spiritual Needs Emotional  Spiritual Framework  Presenting Themes Courage hope and growth   Chaplain provided follow up visit who is Spanish speaking only and has been in hospital 311 days. Purpose of visit is to have conversation to hopefully encourage and provide some hope.

## 2022-12-21 NOTE — Progress Notes (Signed)
Patient seen and examined. Pain is slightly better. No reported issues w TPN  PE chronically ill Neck swelling w/o evidence of airway compromise, no stridor, no infection some echymosis and post op edema Abd: eaking pouch w/o leaks  A/P Continue TPN and supportive care Repeat vein u/s to assess DVT related to picc

## 2022-12-21 NOTE — Progress Notes (Signed)
PHARMACY - TOTAL PARENTERAL NUTRITION CONSULT NOTE   Indication: Prolonged ileus  Patient Measurements: Height: 4\' 11"  (149.9 cm) Weight: 102.8 kg (226 lb 10.1 oz) IBW/kg (Calculated) : 43.2 TPN AdjBW (KG): 57.8 Body mass index is 45.77 kg/m.  Assessment: Debra [name] is a 57 y.o. female s/p laparotomy, excision of greater omental mass, abdominal wall reconstruction with [name], appendectomy, and placement of Prevena vac. New DVT and PICC was replaced 4/12.   Glucose / Insulin:  --No apparent history of diabetes --BG controlled on current regimen.  --rSSI 4x/day (0500, 1300, 1800, 2200) --SSI last 24h: 7 units  --BG/24h: 92 - 200 --8 units insulin in TPN Electrolytes: Within normal limits Renal: SCr stable at baseline  Hepatic: Liver function within normal limits GI Imaging: 9/11 CTAP: no new acute issues GI Surgeries / Procedures: s/p laparotomy, excision of greater omental mass, abdominal wall reconstruction with [name], appendectomy, and placement of Prevena vac  Central access: 02/15/22 TPN start date: 02/15/22  Nutritional Goals: Goal cyclic TPN over 16 hrs: cyclic TPN over 16 hrs: (provides 102 g of protein and 1811 kcals per day) total volume over 16hrs for calculations=1400 ml (1500 ml total with overfill)  RD Assessment:  Estimated Needs Total Energy Estimated Needs: 1800-2100kcal/day Total Protein Estimated Needs: 90-110g/d Total Fluid Estimated Needs: 1.4-1.6L/day  Current Nutrition:  Soft diet + nutritional supplements, not meeting PO needs yet   Plan: Transitioned to *Cyclic* TPN on 09/16/30.   to run over 16 hours per MD request. [name] at 2000 per discussion with dietician to allow patient time for walking in afternoon/evenings.  To run over 16 hours: -Start rate at 49 mL/hr for 1 hour. -Increase rate to 98 mL/hr for 13 hours.  -Decrease rate to 49 mL/hr for 1 hour. -Decrease rate to 25 mL/hr for 1 hour, then  stop.  Plan:  See cyclic TPN rate above Nutritional Components Amino acids (using 15% Clinisol): 101 g Dextrose 19% = 266 g Lipids (using 20% SMOFlipids): 50.4 g kCal: 1817/24h  Electrolytes in TPN: Na 70 mEq/L, K 23mEq/L, Ca 70mEq/L, Mg 10 mEq/L, Phos 10 mmol/L  Continue ratio Cl:Ac 1:1  Continue trace elements, MVI, chromium, zinc and selenium per dietary recs (hold today per RD pending check of vitamin / mineral labs) Continue CBG/SSI for Cyclic TPN:  -CBG 2 hrs after cyclic TPN start -CBG during middle of cyclic TPN infusion -CBG 1 hr after cylic TPN stopped -CBG while off TPN Continue SSI insulin at moderate scale and add 8 units of insulin in TPN Check TPN labs on Mondays   Ronnald Ramp 12/21/2022

## 2022-12-22 ENCOUNTER — Inpatient Hospital Stay: Payer: Medicaid Other

## 2022-12-22 ENCOUNTER — Encounter: Payer: Self-pay | Admitting: Surgery

## 2022-12-22 LAB — GLUCOSE, CAPILLARY
Glucose-Capillary: 112 mg/dL — ABNORMAL HIGH (ref 70–99)
Glucose-Capillary: 207 mg/dL — ABNORMAL HIGH (ref 70–99)
Glucose-Capillary: 302 mg/dL — ABNORMAL HIGH (ref 70–99)
Glucose-Capillary: 180 mg/dL — ABNORMAL HIGH (ref 70–99)
Glucose-Capillary: 131 mg/dL — ABNORMAL HIGH (ref 70–99)
Glucose-Capillary: 148 mg/dL — ABNORMAL HIGH (ref 70–99)
Glucose-Capillary: 194 mg/dL — ABNORMAL HIGH (ref 70–99)
Glucose-Capillary: 150 mg/dL — ABNORMAL HIGH (ref 70–99)
Glucose-Capillary: 133 mg/dL — ABNORMAL HIGH (ref 70–99)
Glucose-Capillary: 200 mg/dL — ABNORMAL HIGH (ref 70–99)
Glucose-Capillary: 180 mg/dL — ABNORMAL HIGH (ref 70–99)
Glucose-Capillary: 131 mg/dL — ABNORMAL HIGH (ref 70–99)
Glucose-Capillary: 130 mg/dL — ABNORMAL HIGH (ref 70–99)
Glucose-Capillary: 187 mg/dL — ABNORMAL HIGH (ref 70–99)

## 2022-12-22 LAB — PREPARE RBC (CROSSMATCH)

## 2022-12-22 LAB — COMPREHENSIVE METABOLIC PANEL
ALT: 25 U/L (ref 0–44)
AST: 29 U/L (ref 15–41)
Albumin: 2.3 g/dL — ABNORMAL LOW (ref 3.5–5.0)
Alkaline Phosphatase: 86 U/L (ref 38–126)
Anion gap: 6 (ref 5–15)
BUN: 28 mg/dL — ABNORMAL HIGH (ref 6–20)
CO2: 23 mmol/L (ref 22–32)
Calcium: 7.6 mg/dL — ABNORMAL LOW (ref 8.9–10.3)
Chloride: 102 mmol/L (ref 98–111)
Creatinine, Ser: 0.61 mg/dL (ref 0.44–1.00)
GFR, Estimated: >60 mL/min (ref >60)
Glucose, Bld: 185 mg/dL — ABNORMAL HIGH (ref 70–99)
Potassium: 3.5 mmol/L (ref 3.5–5.1)
Sodium: 131 mmol/L — ABNORMAL LOW (ref 135–145)
Total Bilirubin: 0.5 mg/dL (ref 0.3–1.2)
Total Protein: 5.2 g/dL — ABNORMAL LOW (ref 6.5–8.1)

## 2022-12-22 LAB — CBC
HCT: 17.9 % — ABNORMAL LOW (ref 36.0–46.0)
Hemoglobin: 5.8 g/dL — ABNORMAL LOW (ref 12.0–15.0)
MCH: 28.3 pg (ref 26.0–34.0)
MCHC: 32.4 g/dL (ref 30.0–36.0)
MCV: 87.3 fL (ref 80.0–100.0)
Platelets: 70 10*3/uL — ABNORMAL LOW (ref 150–400)
RBC: 2.05 MIL/uL — ABNORMAL LOW (ref 3.87–5.11)
RDW: 16 % — ABNORMAL HIGH (ref 11.5–15.5)
WBC: 3.8 10*3/uL — ABNORMAL LOW (ref 4.0–10.5)
nRBC: 0 % (ref 0.0–0.2)

## 2022-12-22 LAB — BPAM RBC: Unit Type and Rh: 5100

## 2022-12-22 LAB — MISC LABCORP TEST (SEND OUT)
Labcorp test code: 71548
Labcorp test code: 71522
Labcorp test code: 724195
Labcorp test code: 70034

## 2022-12-22 LAB — TYPE AND SCREEN

## 2022-12-22 LAB — HEMOGLOBIN AND HEMATOCRIT, BLOOD
HCT: 17.9 % — ABNORMAL LOW (ref 36.0–46.0)
Hemoglobin: 5.6 g/dL — ABNORMAL LOW (ref 12.0–15.0)

## 2022-12-22 LAB — PHOSPHORUS: Phosphorus: 2 mg/dL — ABNORMAL LOW (ref 2.5–4.6)

## 2022-12-22 LAB — VITAMIN B6: Vitamin B6: 8.9 ug/L (ref 3.4–65.2)

## 2022-12-22 LAB — MAGNESIUM: Magnesium: 2.1 mg/dL (ref 1.7–2.4)

## 2022-12-22 MED ORDER — K PHOS MONO-SOD PHOS DI & MONO 155-852-130 MG PO TABS
500.0000 mg | ORAL_TABLET | Freq: Once | ORAL | Status: AC
  Administered 2022-12-22: 500 mg via ORAL
  Filled 2022-12-22: qty 2

## 2022-12-22 MED ORDER — SODIUM CHLORIDE 0.9% IV SOLUTION: Freq: Once | INTRAVENOUS | Status: AC

## 2022-12-22 MED ORDER — ZINC CHLORIDE 1 MG/ML IV SOLN
INTRAVENOUS | Status: AC
  Filled 2022-12-22: qty 672

## 2022-12-22 NOTE — Plan of Care (Signed)
  Problem: Clinical Measurements: Goal: Will remain free from infection Outcome: Progressing   Problem: Nutrition: Goal: Adequate nutrition will be maintained Outcome: Progressing   Problem: Activity: Goal: Risk for activity intolerance will decrease Outcome: Progressing   Problem: Nutrition: Goal: Adequate nutrition will be maintained Outcome: Progressing   Problem: Coping: Goal: Level of anxiety will decrease Outcome: Progressing   Problem: Pain Managment: Goal: General experience of comfort will improve Outcome: Progressing   

## 2022-12-22 NOTE — Progress Notes (Signed)
[facility] SURGICAL PROGRESS NOTE   Hospital Day(s): 312.   Post op day(s): 3 Days Post-Op.   Interval History:  Patient seen and examined No new issues overnight Hgb check this morning to 5.8; recheck pending No oozing from ostomy; port site clean Labs otherwise reassuring aside from mild hypophosphatemia  [name] output unmeasured; managing effluent independently On TPN; On Soft diet She is ambulating better; reviewed mobility notes   Review of Systems:  Constitutional: denies fever, chills  HEENT: denies cough or congestion  Respiratory: denies any shortness of breath  Cardiovascular: denies chest pain or palpitations  Gastrointestinal: denied abdominal pain, denies N/V Genitourinary: denies burning with urination or urinary frequency Integumentary: + midline wound (stable)   Vital signs in last 24 hours: [min-max] current  Temp:  [98 F (36.7 C)-98.4 F (36.9 C)] 98.3 F (36.8 C) (04/15 0547) Pulse Rate:  [72-90] 87 (04/15 0547) Resp:  [16-18] 18 (04/15 0547) BP: (93-96)/(58-63) 93/63 (04/15 0547) SpO2:  [96 %-98 %] 96 % (04/15 0547) Weight:  [101 kg] 101 kg (04/15 0356)     Height:  (149.9 cm) Weight: 101 kg BMI (Calculated): 44.95   Intake/Output last 2 shifts:  04/14 0701 - 04/15 0700 In: 1291.2 [I.V.:1291.2] Out: 50 [Stool:50]   Physical Exam:  Constitutional: alert, cooperative and no distress  Respiratory: breathing non-labored at rest  Cardiovascular: regular rate and sinus rhythm  Gastrointestinal: Soft, non-tender, non-distended, no rebound/guarding. Integumentary: Midline wound healing by secondary intention, peritoneum closed; granulating and new skin growing over majority of wound now; there are three stomatized areas visible in the LUQ and LLQ portions of the wound. No active bleeding. Chest: Port to the right chest; mild ecchymosis; no evidence of hematoma or bleed   Labs:     Latest Ref Rng & Units 12/22/2022    5:47 AM  12/19/2022    3:26 PM 12/16/2022    6:19 AM  CBC  WBC 4.0 - 10.5 K/uL 3.8   3.7   Hemoglobin 12.0 - 15.0 g/dL 5.8  8.2  8.6   Hematocrit 36.0 - 46.0 % 17.9  25.2  26.7   Platelets 150 - 400 K/uL 70   86       Latest Ref Rng & Units 12/22/2022    5:47 AM 12/15/2022    6:03 AM 12/08/2022    3:35 AM  CMP  Glucose 70 - 99 mg/dL 161  096  045   BUN 6 - 20 mg/dL Creatinine 0.44 - 1.00 mg/dL 4.09  8.11  9.14   Sodium 135 - 145 mmol/L 131  135  134   Potassium 3.5 - 5.1 mmol/L 3.5  4.2  4.0   Chloride 98 - 111 mmol/L 102  107  107   CO2 22 - 32 mmol/L Calcium 8.9 - 10.3 mg/dL 7.6  8.0  8.1   Total Protein 6.5 - 8.1 g/dL 5.2  5.8  5.8   Total Bilirubin 0.3 - 1.2 mg/dL 0.5  0.5  0.5   Alkaline Phos 38 - 126 U/L 86  132  118   AST 15 - 41 U/L 29  40  35   ALT 0 - 44 U/L 25  32  28     Imaging studies: No new pertinent imaging studies this AM   Assessment/Plan:  57 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  - New 04/15: Recheck Hgb 5.6; no oozing from [name] this morning; port site clean aside from mild ecchymosis. Will transfuse 1 unit pRBCs; recheck in AM.   - Wound Care ([name] Pouch); Now on smaller [name] pouch; continues managing effluent independently. Intermittent oozing from time to time. Change as needed.    - Continue soft diet as tolerated  - Continue cyclic TPN; weekly nutritional labs - Appreciate dietary assistance              - Monitor abdominal examination; on-going bowel function            - Pain control prn; antemetic prn            - Progressed with therapies; no longer any recommendations; ambulating well    -  Appreciate neurosurgery assistance with compression fracture; serial XRs   - Discharge Planning: Continue to anticipate lengthy admission +/- possible transfer; no new progress regarding disposition unfortunately   All of the above findings and recommendations were discussed with the patient, and the medical team, and all of patient's questions were answered to her expressed satisfaction.  -- [name], PA-C [facility] 12/22/2022, 7:47 AM M-F: 7am - 4pm

## 2022-12-22 NOTE — Progress Notes (Signed)
PHARMACY - TOTAL PARENTERAL NUTRITION CONSULT NOTE   Indication: Prolonged ileus  Patient Measurements: Height: 4\' 11"  (149.9 cm) Weight: 101 kg (222 lb 10.6 oz) IBW/kg (Calculated) : 43.2 TPN AdjBW (KG): 57.8 Body mass index is 44.97 kg/m.  Assessment: Yvaine Aubrina Bolster is a 57 y.o. female s/p laparotomy, excision of greater omental mass, abdominal wall reconstruction with [name], appendectomy, and placement of Prevena vac. New DVT and PICC was replaced 4/12.   Glucose / Insulin:  --No apparent history of diabetes --BG controlled on current regimen.  --rSSI 4x/day (0500, 1300, 1800, 2200) --SSI last 24h: 7 units  --BG/24h: 92 - 200 --8 units insulin in TPN Electrolytes: Within normal limits Renal: SCr stable at baseline  Hepatic: Liver function within normal limits GI Imaging: 9/11 CTAP: no new acute issues GI Surgeries / Procedures: s/p laparotomy, excision of greater omental mass, abdominal wall reconstruction with [name], appendectomy, and placement of Prevena vac  Central access: 02/15/22 TPN start date: 02/15/22  Nutritional Goals: Goal cyclic TPN over 16 hrs: cyclic TPN over 16 hrs: (provides 102 g of protein and 1811 kcals per day) total volume over 16hrs for calculations=1400 ml (1500 ml total with overfill)  RD Assessment:  Estimated Needs Total Energy Estimated Needs: 1800-2100kcal/day Total Protein Estimated Needs: 90-110g/d Total Fluid Estimated Needs: 1.4-1.6L/day  Current Nutrition:  Soft diet + nutritional supplements, not meeting PO needs yet   Plan: Transitioned to *Cyclic* TPN on 02/13/36.   to run over 16 hours per MD request. [name] at 2000 per discussion with dietician to allow patient time for walking in afternoon/evenings.  To run over 16 hours: -Start rate at 49 mL/hr for 1 hour. -Increase rate to 98 mL/hr for 13 hours.  -Decrease rate to 49 mL/hr for 1 hour. -Decrease rate to 25 mL/hr for 1 hour, then  stop.  Plan:  See cyclic TPN rate above Nutritional Components Amino acids (using 15% Clinisol): 101 g Dextrose 19% = 266 g Lipids (using 20% SMOFlipids): 50.4 g kCal: 1817/24h  Electrolytes in TPN: Na 70 mEq/L, K 72mEq/L, Ca 90mEq/L, Mg 10 mEq/L, Phos 10 mmol/L  Continue ratio Cl:Ac 1:1  Continue trace elements, MVI, chromium, zinc and selenium per dietary recs (hold today per RD pending check of vitamin / mineral labs) Continue CBG/SSI for Cyclic TPN:  -CBG 2 hrs after cyclic TPN start -CBG during middle of cyclic TPN infusion -CBG 1 hr after cylic TPN stopped -CBG while off TPN Continue SSI insulin at moderate scale and add 8 units of insulin in TPN 500 mg po Kphos x 1 (contains phosphorus 16 mMol, potassium 2.2 mEq) Check TPN labs on Mondays   [name] 12/22/2022

## 2022-12-23 LAB — GLUCOSE, CAPILLARY
Glucose-Capillary: 192 mg/dL — ABNORMAL HIGH (ref 70–99)
Glucose-Capillary: 166 mg/dL — ABNORMAL HIGH (ref 70–99)
Glucose-Capillary: 189 mg/dL — ABNORMAL HIGH (ref 70–99)
Glucose-Capillary: 107 mg/dL — ABNORMAL HIGH (ref 70–99)
Glucose-Capillary: 120 mg/dL — ABNORMAL HIGH (ref 70–99)

## 2022-12-23 LAB — CBC
HCT: 24.2 % — ABNORMAL LOW (ref 36.0–46.0)
Hemoglobin: 8.3 g/dL — ABNORMAL LOW (ref 12.0–15.0)
MCH: 29 pg (ref 26.0–34.0)
MCHC: 34.3 g/dL (ref 30.0–36.0)
MCV: 84.6 fL (ref 80.0–100.0)
Platelets: 71 10*3/uL — ABNORMAL LOW (ref 150–400)
RBC: 2.86 MIL/uL — ABNORMAL LOW (ref 3.87–5.11)
RDW: 16.4 % — ABNORMAL HIGH (ref 11.5–15.5)
WBC: 4.1 10*3/uL (ref 4.0–10.5)
nRBC: 0.5 % — ABNORMAL HIGH (ref 0.0–0.2)

## 2022-12-23 LAB — TYPE AND SCREEN
ABO/RH(D): O POS
Antibody Screen: NEGATIVE
Unit division: 0
Unit division: 0

## 2022-12-23 LAB — BPAM RBC
Blood Product Expiration Date: 202405012359
Blood Product Expiration Date: 202405012359
ISSUE DATE / TIME: 202404151511
ISSUE DATE / TIME: 202404151743
Unit Type and Rh: 5100

## 2022-12-23 LAB — MISC LABCORP TEST (SEND OUT): Labcorp test code: 716910

## 2022-12-23 LAB — COPPER, SERUM: Copper: 109 ug/dL (ref 80–158)

## 2022-12-23 LAB — ZINC: Zinc: 44 ug/dL (ref 44–115)

## 2022-12-23 MED ORDER — ZINC CHLORIDE 1 MG/ML IV SOLN
INTRAVENOUS | Status: DC
  Filled 2022-12-23: qty 672

## 2022-12-23 NOTE — Progress Notes (Signed)
Nutrition Follow-up  DOCUMENTATION CODES:   Obesity unspecified  INTERVENTION:   Continue cyclic TPN per pharmacy (16 hrs)- provides 1811kcal/day and 102g/day protein   Ensure Enlive po BID, each supplement provides 350 kcal and 20 grams of protein.  MVI, chromium and trace elements in TPN   Triglycerides checked monthly   Daily weights   Discontinue Vitamin D2  Continue Zinc additional  daily added to TPN   Continue Selenium additional daily added to TPN   Will check routine vitamin labs every 3 months, next due 7/11  NUTRITION DIAGNOSIS:   Increased nutrient needs related to wound healing, catabolic illness as evidenced by estimated needs. -ongoing   GOAL:   Patient will meet greater than or equal to 90% of their needs -met with TPN   MONITOR:   PO intake, Supplement acceptance, Labs, Weight trends, Diet advancement, I & O's, TPN  ASSESSMENT:   57 y/o female with h/o hypothyroidism, COVID 19 (3/21), kidney stones and stage 3 colon cancer (s/p left hemicolectomy 5/21 and chemotherapy) who is admitted for new pelvic mass now s/p laparotomy 6/8 (with excision of pelvic mass from greater omentum, abdominal wall reconstruction with bilateral myocutaneous flaps and mesh, incisional hernia repair, appendectomy repair and VAC placement) complicated by bowel perforation s/p reopening of recent laparotomy 6/10 (with repair of small bowel perforation, excision of mesh, placement of two phasix mesh and VAC placement). Pathology returned as metastatic adenocarcinoma. Pt with L1 compression fracture.   Pt s/p right IJ port 4/12  Pt continues to tolerate TPN well at goal rate; TPN is currently being cycled for 16 hrs. PICC line removed for DVT. Pt is now receiving TPN via port. Triglycerides wnl and are being checked monthly. Hyperglycemia improved with insulin in TPN. Volume status being monitored; pt up ~28lbs since admission. Will check routine vitamin labs every 3 months  as pt on chronic TPN (next due 7/11). Pt continues to have poor oral intake; pt eating <25% of meals. Pt on soft diet. [name] pouch in place with  unmeasured output. No discharge plan in place.   Medications reviewed and include: insulin, synthroid, protonix, TPN  -Selenium 81(L), zinc 44 wnl, -Chromium- pend, copper- 109 wnl, Manganese- pend, vitamin D 74.99 wnl, Iodine 70 wnl, aluminum 10(H)- 4/12 -Iron 25(L), TIBC 398, ferritin 46 wnl, folate 29.0 wnl, B12 922(H)- 4/12 -Vitamin B1- pend, vitamin A- pend, B6- 8.9 wnl, vitamin C- pend, vitamin E- pend, vitamin K- pend 4/12  Labs reviewed: Na 131(L), K 3.5 wnl, BUN 28(H), P 2.0(L), Mg 2.1 wnl- 4/15 Prealbumin- 11(L)- 3/31 Triglycerides- 129- 3/4 Wbc- 3.7(L), Hgb 8.6(L), Hct 26.7(L) Cbgs- 189, 166, 192 x 24 hrs  Diet Order:    Diet Order             Diet heart healthy/carb modified Room service appropriate? Yes; Fluid consistency: Thin  Diet effective now                  EDUCATION NEEDS:   Not appropriate for education at this time  Skin:  Skin Assessment: Reviewed RN Assessment ( Midline wound: 11cm x 12cm )  Last BM:  4/9- type 6  Height:   Ht Readings from Last 1 Encounters:  12/19/22  (1.499 m)    Weight:   Wt Readings from Last 1 Encounters:  12/23/22 102.1 kg    Ideal Body Weight:  44.3 kg  BMI:  Body mass index is 45.44 kg/m.  Estimated Nutritional Needs:   Kcal:  1800-2100kcal/day  Protein:  90-110g/d  Fluid:  1.4-1.6L/day  Betsey Holiday MS, RD, LDN Please refer to [facility] for RD and/or RD on-call/weekend/after hours pager

## 2022-12-23 NOTE — Progress Notes (Addendum)
PHARMACY - TOTAL PARENTERAL NUTRITION CONSULT NOTE   Indication: Prolonged ileus  Patient Measurements: Height:  (149.9 cm) Weight: 100 kg (220 lb 7.4 oz) IBW/kg (Calculated) : 43.2 TPN AdjBW (KG): 57.8 Body mass index is 44.53 kg/m.  Assessment: Debra [name] is a 57 y.o. female s/p laparotomy, excision of greater omental mass, abdominal wall reconstruction with Debra [name] release, appendectomy, and placement of Prevena vac. New DVT and PICC was replaced 4/12.   Glucose / Insulin:  --No apparent history of diabetes --BG controlled on current regimen.  --rSSI 4x/day (0500, 1300, 1800, 2200) --SSI last 24h: 10 units  --BG/24h: 130 - 192 --8 units insulin in TPN Electrolytes: Within normal limits Renal: SCr stable at baseline  Hepatic: Liver function within normal limits GI Imaging: 9/11 CTAP: no new acute issues GI Surgeries / Procedures: s/p laparotomy, excision of greater omental mass, abdominal wall reconstruction with Debra [name] release, appendectomy, and placement of Prevena vac  Central access: 02/15/22 TPN start date: 02/15/22  Nutritional Goals: Goal cyclic TPN over 16 hrs: cyclic TPN over 16 hrs: (provides 102 g of protein and 1811 kcals per day) total volume over 16hrs for calculations=1400 ml (1500 ml total with overfill)  RD Assessment:  Estimated Needs Total Energy Estimated Needs: 1800-2100kcal/day Total Protein Estimated Needs: 90-110g/d Total Fluid Estimated Needs: 1.4-1.6L/day  Current Nutrition:  Soft diet + nutritional supplements, not meeting PO needs yet    Plan:  we received Debra [name]'s TPN from [facility] main campus leaking heavily and in a state where sterility has been compromised. Because the TPN compounder has been broken down for the day we will be unable to replace her TPN with a new bag tonight: MD made aware Continue SSI insulin at moderate scale  Check TPN labs on Mondays   Debra [name] 12/23/2022

## 2022-12-23 NOTE — Progress Notes (Signed)
[facility] SURGICAL PROGRESS NOTE   Hospital Day(s): 313.   Post op day(s): 4 Days Post-Op.   Interval History:  Patient seen and examined No new issues overnight Did get 2 units pRBCs yesterday (04/15); Hgb this AM 8.3 No oozing from ostomy; port site clean  [name] output unmeasured; managing effluent independently On TPN; On Soft diet She is ambulating better; reviewed mobility notes   Review of Systems:  Constitutional: denies fever, chills  HEENT: denies cough or congestion  Respiratory: denies any shortness of breath  Cardiovascular: denies chest pain or palpitations  Gastrointestinal: denied abdominal pain, denies N/V Genitourinary: denies burning with urination or urinary frequency Integumentary: + midline wound (stable)   Vital signs in last 24 hours: [min-max] current  Temp:  [98 F (36.7 C)-99.3 F (37.4 C)] 98 F (36.7 C) (04/16 0358) Pulse Rate:  [68-83] 72 (04/16 0358) Resp:  [18-20] 20 (04/16 0358) BP: (92-114)/(49-73) 114/61 (04/16 0358) SpO2:  [95 %-100 %] 100 % (04/16 0358) Weight:  [100 kg] 100 kg (04/16 0358)     Height:  (149.9 cm) Weight: 100 kg BMI (Calculated): 44.5   Intake/Output last 2 shifts:  04/15 0701 - 04/16 0700 In: 1449.4 [I.V.:971.4; Blood:478] Out: -    Physical Exam:  Constitutional: alert, cooperative and no distress  Respiratory: breathing non-labored at rest  Cardiovascular: regular rate and sinus rhythm  Gastrointestinal: Soft, non-tender, non-distended, no rebound/guarding. Integumentary: Midline wound healing by secondary intention, peritoneum closed; granulating and new skin growing over majority of wound now; there are three stomatized areas visible in the LUQ and LLQ portions of the wound. No active bleeding. Chest: Port to the right chest; mild ecchymosis; no evidence of hematoma or bleed   Labs:     Latest Ref Rng & Units 12/23/2022    5:06 AM 12/22/2022    8:00 AM 12/22/2022    5:47 AM  CBC   WBC 4.0 - 10.5 K/uL 4.1   3.8   Hemoglobin 12.0 - 15.0 g/dL 8.3  5.6  5.8   Hematocrit 36.0 - 46.0 % 24.2  17.9  17.9   Platelets 150 - 400 K/uL 71   70       Latest Ref Rng & Units 12/22/2022    5:47 AM 12/15/2022    6:03 AM 12/08/2022    3:35 AM  CMP  Glucose 70 - 99 mg/dL 960  454  098   BUN 6 - 20 mg/dL Creatinine 0.44 - 1.00 mg/dL 1.19  1.47  8.29   Sodium 135 - 145 mmol/L 131  135  134   Potassium 3.5 - 5.1 mmol/L 3.5  4.2  4.0   Chloride 98 - 111 mmol/L 102  107  107   CO2 22 - 32 mmol/L Calcium 8.9 - 10.3 mg/dL 7.6  8.0  8.1   Total Protein 6.5 - 8.1 g/dL 5.2  5.8  5.8   Total Bilirubin 0.3 - 1.2 mg/dL 0.5  0.5  0.5   Alkaline Phos 38 - 126 U/L 86  132  118   AST 15 - 41 U/L 29  40  35   ALT 0 - 44 U/L 25  32  28     Imaging studies: No new pertinent imaging studies this AM   Assessment/Plan:  57 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  - New 04/16: Hgb responded appropriately to transfusion on 04/15; monitor. No oozing or evidence of bleeding. Otherwise no acute issues.   - Wound Care ([name] Pouch); Now on smaller [name] pouch; continues managing effluent independently. Intermittent oozing from time to time. Change as needed.    - Continue soft diet as tolerated  - Continue cyclic TPN; weekly nutritional labs - Appreciate dietary assistance              - Monitor abdominal examination; on-going bowel function            - Pain control prn; antemetic prn            - Progressed with therapies; no longer any recommendations; ambulating well    - Appreciate neurosurgery assistance with compression fracture; serial  XRs   - Discharge Planning: Continue to anticipate lengthy admission +/- possible transfer; no new progress regarding disposition unfortunately   All of the above findings and recommendations were discussed with the patient, and the medical team, and all of patient's questions were answered to her expressed satisfaction.  -- [name], PA-C [facility] 12/23/2022, 7:23 AM M-F: 7am - 4pm

## 2022-12-23 NOTE — Plan of Care (Signed)
  Problem: Clinical Measurements: Goal: Will remain free from infection Outcome: Progressing   Problem: Nutrition: Goal: Adequate nutrition will be maintained Outcome: Progressing   Problem: Activity: Goal: Risk for activity intolerance will decrease Outcome: Progressing   Problem: Nutrition: Goal: Adequate nutrition will be maintained Outcome: Progressing   Problem: Coping: Goal: Level of anxiety will decrease Outcome: Progressing   Problem: Pain Managment: Goal: General experience of comfort will improve Outcome: Progressing   

## 2022-12-24 LAB — VITAMIN B1: Vitamin B1 (Thiamine): 73.2 nmol/L (ref 66.5–200.0)

## 2022-12-24 LAB — GLUCOSE, CAPILLARY
Glucose-Capillary: 81 mg/dL (ref 70–99)
Glucose-Capillary: 111 mg/dL — ABNORMAL HIGH (ref 70–99)
Glucose-Capillary: 105 mg/dL — ABNORMAL HIGH (ref 70–99)

## 2022-12-24 LAB — MISC LABCORP TEST (SEND OUT): Labcorp test code: 706500

## 2022-12-24 LAB — VITAMIN E
Vitamin E (Alpha Tocopherol): 12.6 mg/L (ref 7.0–25.1)
Vitamin E(Gamma Tocopherol): 0.6 mg/L (ref 0.5–5.5)

## 2022-12-24 LAB — VITAMIN A: Vitamin A (Retinoic Acid): 11.2 ug/dL — ABNORMAL LOW (ref 20.1–62.0)

## 2022-12-24 LAB — VITAMIN K1, SERUM: VITAMIN K1: 0.49 ng/mL (ref 0.10–2.20)

## 2022-12-24 LAB — VITAMIN C: Vitamin C: 0.9 mg/dL (ref 0.4–2.0)

## 2022-12-24 MED ORDER — ZINC CHLORIDE 1 MG/ML IV SOLN
INTRAVENOUS | Status: AC
  Filled 2022-12-24: qty 672

## 2022-12-24 MED ORDER — VITAMIN A 3 MG (10000 UNIT) PO CAPS
10000.0000 [IU] | ORAL_CAPSULE | Freq: Every day | ORAL | Status: DC
  Administered 2022-12-25 – 2023-03-08 (×73): 10000 [IU] via ORAL
  Filled 2022-12-24 (×78): qty 1

## 2022-12-24 NOTE — Progress Notes (Signed)
PHARMACY - TOTAL PARENTERAL NUTRITION CONSULT NOTE   Indication: Prolonged ileus  Patient Measurements: Height: 4\' 11"  (149.9 cm) Weight: 98.4 kg (216 lb 14.9 oz) IBW/kg (Calculated) : 43.2 TPN AdjBW (KG): 57.8 Body mass index is 43.82 kg/m.  Assessment: Debra [name] is a 57 y.o. female s/p laparotomy, excision of greater omental mass, abdominal wall reconstruction with Debra [name] release, appendectomy, and placement of Prevena vac. New DVT and PICC was replaced 4/12.   Glucose / Insulin:  --No apparent history of diabetes --BG controlled on current regimen.  --rSSI 4x/day (0500, 1300, 1800, 2200) --SSI last 24h: 3 units  --BG/24h: 89 - 189 --8 units insulin in TPN Electrolytes: Within normal limits Renal: SCr stable at baseline  Hepatic: Liver function within normal limits GI Imaging: 9/11 CTAP: no new acute issues GI Surgeries / Procedures: s/p laparotomy, excision of greater omental mass, abdominal wall reconstruction with Debra [name] release, appendectomy, and placement of Prevena vac  Central access: 02/15/22 TPN start date: 02/15/22  Nutritional Goals: Goal cyclic TPN over 16 hrs: cyclic TPN over 16 hrs: (provides 102 g of protein and 1811 kcals per day) total volume over 16hrs for calculations=1400 ml (1500 ml total with overfill)  RD Assessment:  Estimated Needs Total Energy Estimated Needs: 1800-2100kcal/day Total Protein Estimated Needs: 90-110g/d Total Fluid Estimated Needs: 1.4-1.6L/day  Current Nutrition:  Soft diet + nutritional supplements, not meeting PO needs yet   Plan: Transitioned to *Cyclic* TPN on 9/60/45.   to run over 16 hours per MD request. Debra [name] at 2000 per discussion with dietician to allow patient time for walking in afternoon/evenings.  To run over 16 hours: -Start rate at 49 mL/hr for 1 hour. -Increase rate to 98 mL/hr for 13 hours.  -Decrease rate to 49 mL/hr for 1 hour. -Decrease rate to 25 mL/hr for 1 hour, then  stop.  Plan:  See cyclic TPN rate above Nutritional Components Amino acids (using 15% Clinisol): 101 g Dextrose 19% = 266 g Lipids (using 20% SMOFlipids): 50.4 g kCal: 1817/24h  Electrolytes in TPN: Na 70 mEq/L, K 22mEq/L, Ca 96mEq/L, Mg 10 mEq/L, Phos 10 mmol/L  Continue ratio Cl:Ac 1:1  Continue trace elements, MVI, chromium, zinc and selenium per dietary recs (hold today per RD pending check of vitamin / mineral labs) Continue CBG/SSI for Cyclic TPN:  -CBG 2 hrs after cyclic TPN start -CBG during middle of cyclic TPN infusion -CBG 1 hr after cylic TPN stopped -CBG while off TPN Continue SSI insulin at moderate scale and add 8 units of insulin in TPN Check TPN labs on Mondays   Debra [name] 12/24/2022

## 2022-12-24 NOTE — Progress Notes (Signed)
[facility] SURGICAL PROGRESS NOTE   Hospital Day(s): 314.   Post op day(s): 5 Days Post-Op.   Interval History:  Patient seen and examined No new issues overnight No new labs; weekly nutritional labs No oozing from ostomy; port site clean  [name] output unmeasured; managing effluent independently On TPN; On Soft diet She is ambulating better; reviewed mobility notes   Review of Systems:  Constitutional: denies fever, chills  HEENT: denies cough or congestion  Respiratory: denies any shortness of breath  Cardiovascular: denies chest pain or palpitations  Gastrointestinal: denied abdominal pain, denies N/V Genitourinary: denies burning with urination or urinary frequency Integumentary: + midline wound (stable)   Vital signs in last 24 hours: [min-max] current  Temp:  [97.9 F (36.6 C)-98.6 F (37 C)] 98.6 F (37 C) (04/17 0349) Pulse Rate:  [69-91] 70 (04/17 0349) Resp:  [16-20] 16 (04/17 0349) BP: (102-124)/(55-74) 102/55 (04/17 0349) SpO2:  [98 %-100 %] 98 % (04/17 0349) Weight:  [98.4 kg-102.1 kg] 98.4 kg (04/17 0349)     Height:  (149.9 cm) Weight: 98.4 kg BMI (Calculated): 43.79   Intake/Output last 2 shifts:  04/16 0701 - 04/17 0700 In: 480 [P.O.:480] Out: 400 [Stool:400]   Physical Exam:  Constitutional: alert, cooperative and no distress  Respiratory: breathing non-labored at rest  Cardiovascular: regular rate and sinus rhythm  Gastrointestinal: Soft, non-tender, non-distended, no rebound/guarding. Integumentary: Midline wound healing by secondary intention, peritoneum closed; granulating and new skin growing over majority of wound now; there are three stomatized areas visible in the LUQ and LLQ portions of the wound. No active bleeding. Chest: Port to the right chest; mild ecchymosis; no evidence of hematoma or bleed   Labs:     Latest Ref Rng & Units 12/23/2022    5:06 AM 12/22/2022    8:00 AM 12/22/2022    5:47 AM  CBC  WBC 4.0 -  10.5 K/uL 4.1   3.8   Hemoglobin 12.0 - 15.0 g/dL 8.3  5.6  5.8   Hematocrit 36.0 - 46.0 % 24.2  17.9  17.9   Platelets 150 - 400 K/uL 71   70       Latest Ref Rng & Units 12/22/2022    5:47 AM 12/15/2022    6:03 AM 12/08/2022    3:35 AM  CMP  Glucose 70 - 99 mg/dL 409  811  914   BUN 6 - 20 mg/dL Creatinine 0.44 - 1.00 mg/dL 7.82  9.56  2.13   Sodium 135 - 145 mmol/L 131  135  134   Potassium 3.5 - 5.1 mmol/L 3.5  4.2  4.0   Chloride 98 - 111 mmol/L 102  107  107   CO2 22 - 32 mmol/L Calcium 8.9 - 10.3 mg/dL 7.6  8.0  8.1   Total Protein 6.5 - 8.1 g/dL 5.2  5.8  5.8   Total Bilirubin 0.3 - 1.2 mg/dL 0.5  0.5  0.5   Alkaline Phos 38 - 126 U/L 86  132  118   AST 15 - 41 U/L 29  40  35   ALT 0 - 44 U/L 25  32  28     Imaging studies: No new pertinent imaging studies this AM   Assessment/Plan:  57 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  - New 04/17: No acute issues today  - Wound Care ([name] Pouch); Now on smaller [name] pouch; continues managing effluent independently. Intermittent oozing from time to time. Change as needed.    - Continue soft diet as tolerated  - Continue cyclic TPN; weekly nutritional labs - Appreciate dietary assistance              - Monitor abdominal examination; on-going bowel function            - Pain control prn; antemetic prn            - Progressed with therapies; no longer any recommendations; ambulating well    - Appreciate neurosurgery assistance with compression fracture; serial XRs   - Discharge Planning: Continue to anticipate lengthy admission +/- possible transfer; no new progress regarding  disposition unfortunately   All of the above findings and recommendations were discussed with the patient, and the medical team, and all of patient's questions were answered to her expressed satisfaction.  -- [name], PA-C [facility] 12/24/2022, 7:31 AM M-F: 7am - 4pm

## 2022-12-25 LAB — GLUCOSE, CAPILLARY
Glucose-Capillary: 108 mg/dL — ABNORMAL HIGH (ref 70–99)
Glucose-Capillary: 186 mg/dL — ABNORMAL HIGH (ref 70–99)
Glucose-Capillary: 203 mg/dL — ABNORMAL HIGH (ref 70–99)
Glucose-Capillary: 140 mg/dL — ABNORMAL HIGH (ref 70–99)
Glucose-Capillary: 127 mg/dL — ABNORMAL HIGH (ref 70–99)
Glucose-Capillary: 137 mg/dL — ABNORMAL HIGH (ref 70–99)

## 2022-12-25 MED ORDER — ZINC CHLORIDE 1 MG/ML IV SOLN
INTRAVENOUS | Status: AC
  Filled 2022-12-25: qty 672

## 2022-12-25 NOTE — Progress Notes (Signed)
PHARMACY - TOTAL PARENTERAL NUTRITION CONSULT NOTE   Indication: Prolonged ileus  Patient Measurements: Height: 4\' 11"  (149.9 cm) Weight: 100.3 kg (221 lb 1.9 oz) IBW/kg (Calculated) : 43.2 TPN AdjBW (KG): 57.8 Body mass index is 44.66 kg/m.  Assessment: Debra [name] is a 57 y.o. female s/p laparotomy, excision of greater omental mass, abdominal wall reconstruction with Debra [name] release, appendectomy, and placement of Prevena vac. New DVT and PICC was replaced 4/12.   Glucose / Insulin:  --No apparent history of diabetes --BG controlled on current regimen.  --rSSI 4x/day (0500, 1300, 1800, 2200) --SSI last 24h: 3 units  --BG/24h: 105 - 186 --8 units insulin in TPN Electrolytes: Within normal limits Renal: SCr stable at baseline  Hepatic: Liver function within normal limits GI Imaging: 9/11 CTAP: no new acute issues GI Surgeries / Procedures: s/p laparotomy, excision of greater omental mass, abdominal wall reconstruction with Debra [name] release, appendectomy, and placement of Prevena vac 12/19/22 placement of right IJ port-a-catheter with dual reservoirs also s/p re-opening of laparotomy for repair of small bowel perforation   Central access: 02/15/22 TPN start date: 02/15/22  Nutritional Goals: Goal cyclic TPN over 16 hrs: cyclic TPN over 16 hrs: (provides 102 g of protein and 1811 kcals per day) total volume over 16hrs for calculations=1400 ml (1500 ml total with overfill)  RD Assessment:  Estimated Needs Total Energy Estimated Needs: 1800-2100kcal/day Total Protein Estimated Needs: 90-110g/d Total Fluid Estimated Needs: 1.4-1.6L/day  Current Nutrition:  Soft diet + nutritional supplements, not meeting PO needs yet   Plan: Transitioned to *Cyclic* TPN on 3/81/77.   to run over 16 hours per MD request. Debra [name] at 2000 per discussion with dietician to allow patient time for walking in afternoon/evenings.  To run over 16 hours: -Start rate at 49 mL/hr for 1  hour. -Increase rate to 98 mL/hr for 13 hours.  -Decrease rate to 49 mL/hr for 1 hour. -Decrease rate to 25 mL/hr for 1 hour, then stop.  Plan:  See cyclic TPN rate above Nutritional Components Amino acids (using 15% Clinisol): 101 g Dextrose 19% = 266 g Lipids (using 20% SMOFlipids): 50.4 g kCal: 1817/24h  Electrolytes in TPN: Na 70 mEq/L, K 19mEq/L, Ca 20mEq/L, Mg 10 mEq/L, Phos 10 mmol/L  Continue ratio Cl:Ac 1:1  Continue trace elements, MVI, chromium, zinc and selenium per dietary recs (hold today per RD pending check of vitamin / mineral labs) Continue CBG/SSI for Cyclic TPN:  -CBG 2 hrs after cyclic TPN start -CBG during middle of cyclic TPN infusion -CBG 1 hr after cylic TPN stopped -CBG while off TPN Continue SSI insulin at moderate scale and add 8 units of insulin in TPN Check TPN labs on Mondays   Debra [name] 12/25/2022

## 2022-12-25 NOTE — Progress Notes (Addendum)
[facility] SURGICAL PROGRESS NOTE   Hospital Day(s): 315.   Post op day(s): 6 Days Post-Op.   Interval History:  Patient seen and examined BP soft but this is stable throughout admission No new issues overnight No new labs; weekly nutritional labs No oozing from ostomy; port site clean  [name] output 100 + unmeasured; managing effluent independently On TPN; On Soft diet She is ambulating better; reviewed mobility notes   Review of Systems:  Constitutional: denies fever, chills  HEENT: denies cough or congestion  Respiratory: denies any shortness of breath  Cardiovascular: denies chest pain or palpitations  Gastrointestinal: denied abdominal pain, denies N/V Genitourinary: denies burning with urination or urinary frequency Integumentary: + midline wound (stable)   Vital signs in last 24 hours: [min-max] current  Temp:  [98.7 F (37.1 C)-98.9 F (37.2 C)] 98.7 F (37.1 C) (04/18 0803) Pulse Rate:  [70-78] 78 (04/18 0803) Resp:  [18-20] 20 (04/18 0803) BP: (99-112)/(56-76) 100/56 (04/18 0803) SpO2:  [97 %-100 %] 97 % (04/18 0803) Weight:  [100.3 kg] 100.3 kg (04/18 0500)     Height: 4\' 11"  (149.9 cm) Weight: 100.3 kg BMI (Calculated): 44.64   Intake/Output last 2 shifts:  04/17 0701 - 04/18 0700 In: 420 [P.O.:420] Out: 100 [Stool:100]   Physical Exam:  Constitutional: alert, cooperative and no distress  Respiratory: breathing non-labored at rest  Cardiovascular: regular rate and sinus rhythm  Gastrointestinal: Soft, non-tender, non-distended, no rebound/guarding. Integumentary: Midline wound healing by secondary intention, peritoneum closed; granulating and new skin growing over majority of wound now; there are three stomatized areas visible in the LUQ and LLQ portions of the wound. No active bleeding. Chest: Port to the right chest; mild ecchymosis; no evidence of hematoma or bleed   Labs:     Latest Ref Rng & Units 12/23/2022    5:06 AM 12/22/2022     8:00 AM 12/22/2022    5:47 AM  CBC  WBC 4.0 - 10.5 K/uL 4.1   3.8   Hemoglobin 12.0 - 15.0 g/dL 8.3  5.6  5.8   Hematocrit 36.0 - 46.0 % 24.2  17.9  17.9   Platelets 150 - 400 K/uL 71   70       Latest Ref Rng & Units 12/22/2022    5:47 AM 12/15/2022    6:03 AM 12/08/2022    3:35 AM  CMP  Glucose 70 - 99 mg/dL 532  023  343   BUN 6 - 20 mg/dL 28  22  22    Creatinine 0.44 - 1.00 mg/dL 5.68  6.16  8.37   Sodium 135 - 145 mmol/L 131  135  134   Potassium 3.5 - 5.1 mmol/L 3.5  4.2  4.0   Chloride 98 - 111 mmol/L 102  107  107   CO2 22 - 32 mmol/L 23  24  24    Calcium 8.9 - 10.3 mg/dL 7.6  8.0  8.1   Total Protein 6.5 - 8.1 g/dL 5.2  5.8  5.8   Total Bilirubin 0.3 - 1.2 mg/dL 0.5  0.5  0.5   Alkaline Phos 38 - 126 U/L 86  132  118   AST 15 - 41 U/L 29  40  35   ALT 0 - 44 U/L 25  32  28     Imaging studies: No new pertinent imaging studies this AM   Assessment/Plan:  57 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  - New 04/18: No acute issues today  - Wound Care ([name] Pouch); Now on smaller [name] pouch; continues managing effluent independently. Intermittent oozing from time to time. Change as needed.    - Continue soft diet as tolerated  - Continue cyclic TPN; weekly nutritional labs - Appreciate dietary assistance              - Monitor abdominal examination; on-going bowel function            - Pain control prn; antemetic prn            - Progressed with therapies; no longer any recommendations; ambulating well    - Appreciate neurosurgery assistance with compression fracture; serial XRs   - Discharge Planning: Continue to anticipate lengthy  admission +/- possible transfer; no new progress regarding disposition unfortunately   All of the above findings and recommendations were discussed with the patient, and the medical team, and all of patient's questions were answered to her expressed satisfaction.  -- [name], PA-C [facility] 12/25/2022, 8:08 AM M-F: 7am - 4pm  Pt seen and examined. I agree w Mr Manus Rudd. No major changes, Continue current care

## 2022-12-26 LAB — GLUCOSE, CAPILLARY
Glucose-Capillary: 209 mg/dL — ABNORMAL HIGH (ref 70–99)
Glucose-Capillary: 159 mg/dL — ABNORMAL HIGH (ref 70–99)
Glucose-Capillary: 166 mg/dL — ABNORMAL HIGH (ref 70–99)
Glucose-Capillary: 123 mg/dL — ABNORMAL HIGH (ref 70–99)

## 2022-12-26 MED ORDER — ZINC CHLORIDE 1 MG/ML IV SOLN
INTRAVENOUS | Status: AC
  Filled 2022-12-26: qty 672

## 2022-12-26 NOTE — Progress Notes (Signed)
[facility] SURGICAL PROGRESS NOTE   Hospital Day(s): 316.   Post op day(s): 7 Days Post-Op.   Interval History:  Patient seen and examined No new issues overnight No new labs; weekly nutritional labs on Monday + PRN No oozing from ostomy; port site clean  [name] output 50 recorded + unmeasured; managing effluent independently On TPN; On Soft diet Ambulating well; independent  Review of Systems:  Constitutional: denies fever, chills  HEENT: denies cough or congestion  Respiratory: denies any shortness of breath  Cardiovascular: denies chest pain or palpitations  Gastrointestinal: denied abdominal pain, denies N/V Genitourinary: denies burning with urination or urinary frequency Integumentary: + midline wound (healing; stable)  Vital signs in last 24 hours: [min-max] current  Temp:  [98.2 F (36.8 C)-98.7 F (37.1 C)] 98.2 F (36.8 C) (04/19 0435) Pulse Rate:  [75-78] 77 (04/19 0435) Resp:  [17-20] 17 (04/19 0435) BP: (100-115)/(56-67) 109/66 (04/19 0435) SpO2:  [97 %-100 %] 99 % (04/19 0435)     Height:  (149.9 cm) Weight: 100.3 kg BMI (Calculated): 44.64   Intake/Output last 2 shifts:  04/18 0701 - 04/19 0700 In: 0  Out: 50 [Stool:50]   Physical Exam:  Constitutional: alert, cooperative and no distress  Respiratory: breathing non-labored at rest  Cardiovascular: regular rate and sinus rhythm  Gastrointestinal: Soft, non-tender, non-distended, no rebound/guarding. Integumentary: Midline wound healing by secondary intention, peritoneum closed; granulating and new skin growing over majority of wound now; there are three stomatized areas visible in the LUQ and LLQ portions of the wound. No active bleeding. Chest: Port to the right chest; mild ecchymosis; no evidence of hematoma or bleed   Labs:     Latest Ref Rng & Units 12/23/2022    5:06 AM 12/22/2022    8:00 AM 12/22/2022    5:47 AM  CBC  WBC 4.0 - 10.5 K/uL 4.1   3.8   Hemoglobin 12.0 -  15.0 g/dL 8.3  5.6  5.8   Hematocrit 36.0 - 46.0 % 24.2  17.9  17.9   Platelets 150 - 400 K/uL 71   70       Latest Ref Rng & Units 12/22/2022    5:47 AM 12/15/2022    6:03 AM 12/08/2022    3:35 AM  CMP  Glucose 70 - 99 mg/dL 782  956  213   BUN 6 - 20 mg/dL Creatinine 0.44 - 1.00 mg/dL 0.86  5.78  4.69   Sodium 135 - 145 mmol/L 131  135  134   Potassium 3.5 - 5.1 mmol/L 3.5  4.2  4.0   Chloride 98 - 111 mmol/L 102  107  107   CO2 22 - 32 mmol/L Calcium 8.9 - 10.3 mg/dL 7.6  8.0  8.1   Total Protein 6.5 - 8.1 g/dL 5.2  5.8  5.8   Total Bilirubin 0.3 - 1.2 mg/dL 0.5  0.5  0.5   Alkaline Phos 38 - 126 U/L 86  132  118   AST 15 - 41 U/L 29  40  35   ALT 0 - 44 U/L 25  32  28     Imaging studies: No new pertinent imaging studies this AM   Assessment/Plan:  57 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  - New 04/19: No acute issues today  - Wound Care ([name] Pouch); Now on smaller [name] pouch; continues managing effluent independently. Intermittent oozing from time to time. Change as needed.    - Continue soft diet as tolerated  - Continue cyclic TPN; weekly nutritional labs - Appreciate dietary assistance - monitoring weight              - Monitor abdominal examination; on-going bowel function            - Pain control prn; antemetic prn            - Progressed with therapies; no longer any recommendations; ambulating well    - Appreciate neurosurgery assistance with compression fracture; serial XRs; nothing further    - Discharge Planning: Continue to anticipate lengthy admission +/- possible transfer; no new progress regarding  disposition unfortunately   All of the above findings and recommendations were discussed with the patient, and the medical team, and all of patient's questions were answered to her expressed satisfaction.  -- [name], PA-C [facility] 12/26/2022, 7:48 AM M-F: 7am - 4pm

## 2022-12-26 NOTE — Progress Notes (Signed)
PHARMACY - TOTAL PARENTERAL NUTRITION CONSULT NOTE   Indication: Prolonged ileus  Patient Measurements: Height:  (149.9 cm) Weight: 100.3 kg (221 lb 1.9 oz) IBW/kg (Calculated) : 43.2 TPN AdjBW (KG): 57.8 Body mass index is 44.66 kg/m.  Assessment: Debra [name] is a 57 y.o. female s/p laparotomy, excision of greater omental mass, abdominal wall reconstruction with [name], appendectomy, and placement of Prevena vac. New DVT and PICC was replaced 4/12.   Glucose / Insulin:  --No apparent history of diabetes --BG controlled on current regimen.  --rSSI 4x/day (0500, 1300, 1800, 2200) --SSI last 24h: 3 units  --BG/24h: 127-209 --8 units insulin in TPN Electrolytes: Within normal limits Renal: SCr stable at baseline  Hepatic: Liver function within normal limits GI Imaging: 9/11 CTAP: no new acute issues GI Surgeries / Procedures: s/p laparotomy, excision of greater omental mass, abdominal wall reconstruction with [name], appendectomy, and placement of Prevena vac 12/19/22 placement of right IJ port-a-catheter with dual reservoirs also s/p re-opening of laparotomy for repair of small bowel perforation   Central access: 02/15/22 TPN start date: 02/15/22  Nutritional Goals: Goal cyclic TPN over 16 hrs: cyclic TPN over 16 hrs: (provides 102 g of protein and 1811 kcals per day) total volume over 16hrs for calculations=1400 ml (1500 ml total with overfill)  RD Assessment:  Estimated Needs Total Energy Estimated Needs: 1800-2100kcal/day Total Protein Estimated Needs: 90-110g/d Total Fluid Estimated Needs: 1.4-1.6L/day  Current Nutrition:  Soft diet + nutritional supplements, not meeting PO needs yet   Plan: Transitioned to *Cyclic* TPN on 1/61/09.   to run over 16 hours per MD request. [name] at 2000 per discussion with dietician to allow patient time for walking in afternoon/evenings.  To run over 16 hours: -Start rate at 49 mL/hr for 1  hour. -Increase rate to 98 mL/hr for 13 hours.  -Decrease rate to 49 mL/hr for 1 hour. -Decrease rate to 25 mL/hr for 1 hour, then stop.  Plan:  See cyclic TPN rate above Nutritional Components Amino acids (using 15% Clinisol): 101 g Dextrose 19% = 266 g Lipids (using 20% SMOFlipids): 50.4 g kCal: 1817/24h  Electrolytes in TPN: Na 70 mEq/L, K 53mEq/L, Ca 82mEq/L, Mg 10 mEq/L, Phos 10 mmol/L  Continue ratio Cl:Ac 1:1  Continue trace elements, MVI, chromium, zinc and selenium per dietary recs  Continue CBG/SSI for Cyclic TPN:  -CBG 2 hrs after cyclic TPN start -CBG during middle of cyclic TPN infusion -CBG 1 hr after cylic TPN stopped -CBG while off TPN Continue SSI insulin at moderate scale and add 8 units of insulin in TPN Check TPN labs on Mondays   Bari Mantis PharmD Clinical Pharmacist 12/26/2022

## 2022-12-27 LAB — GLUCOSE, CAPILLARY: Glucose-Capillary: 203 mg/dL — ABNORMAL HIGH (ref 70–99)

## 2022-12-27 LAB — BASIC METABOLIC PANEL
Anion gap: 4 — ABNORMAL LOW (ref 5–15)
BUN: 20 mg/dL (ref 6–20)
CO2: 23 mmol/L (ref 22–32)
Calcium: 7.7 mg/dL — ABNORMAL LOW (ref 8.9–10.3)
Chloride: 106 mmol/L (ref 98–111)
Creatinine, Ser: 0.51 mg/dL (ref 0.44–1.00)
GFR, Estimated: >60 mL/min (ref >60)
Glucose, Bld: 131 mg/dL — ABNORMAL HIGH (ref 70–99)
Potassium: 4.4 mmol/L (ref 3.5–5.1)
Sodium: 133 mmol/L — ABNORMAL LOW (ref 135–145)

## 2022-12-27 LAB — CBC
HCT: 24.6 % — ABNORMAL LOW (ref 36.0–46.0)
Hemoglobin: 7.9 g/dL — ABNORMAL LOW (ref 12.0–15.0)
MCH: 27.9 pg (ref 26.0–34.0)
MCHC: 32.1 g/dL (ref 30.0–36.0)
MCV: 86.9 fL (ref 80.0–100.0)
Platelets: 111 10*3/uL — ABNORMAL LOW (ref 150–400)
RBC: 2.83 MIL/uL — ABNORMAL LOW (ref 3.87–5.11)
RDW: 15.4 % (ref 11.5–15.5)
WBC: 4.6 10*3/uL (ref 4.0–10.5)
nRBC: 0 % (ref 0.0–0.2)

## 2022-12-27 LAB — MAGNESIUM: Magnesium: 2 mg/dL (ref 1.7–2.4)

## 2022-12-27 LAB — PHOSPHORUS: Phosphorus: 2.8 mg/dL (ref 2.5–4.6)

## 2022-12-27 MED ORDER — ZINC CHLORIDE 1 MG/ML IV SOLN
INTRAVENOUS | Status: AC
  Filled 2022-12-27: qty 672

## 2022-12-27 MED ORDER — MORPHINE SULFATE (PF) 2 MG/ML IV SOLN: 2.0000 mg | INTRAVENOUS | Status: DC | PRN

## 2022-12-27 NOTE — Progress Notes (Signed)
PHARMACY - TOTAL PARENTERAL NUTRITION CONSULT NOTE   Indication: Prolonged ileus  Patient Measurements: Height:  (149.9 cm) Weight: 101.7 kg (224 lb 3.3 oz) IBW/kg (Calculated) : 43.2 TPN AdjBW (KG): 57.8 Body mass index is 45.28 kg/m.  Assessment: Debra [name] is a 57 y.o. female s/p laparotomy, excision of greater omental mass, abdominal wall reconstruction with [name], appendectomy, and placement of Prevena vac. New DVT and PICC was replaced 4/12.   Glucose / Insulin:  --No apparent history of diabetes --BG controlled on current regimen.  --rSSI 4x/day (0500, 1300, 1800, 2200) --SSI last 24h: 3 units  --BG/24h: 123-209 --8 units insulin in TPN Electrolytes: Within normal limits Renal: SCr stable at baseline  Hepatic: Liver function within normal limits GI Imaging: 9/11 CTAP: no new acute issues GI Surgeries / Procedures: s/p laparotomy, excision of greater omental mass, abdominal wall reconstruction with [name], appendectomy, and placement of Prevena vac 12/19/22 placement of right IJ port-a-catheter with dual reservoirs also s/p re-opening of laparotomy for repair of small bowel perforation   Central access: 02/15/22 TPN start date: 02/15/22  Nutritional Goals: Goal cyclic TPN over 16 hrs: cyclic TPN over 16 hrs: (provides 102 g of protein and 1811 kcals per day) total volume over 16hrs for calculations=1400 ml (1500 ml total with overfill)  RD Assessment:  Estimated Needs Total Energy Estimated Needs: 1800-2100kcal/day Total Protein Estimated Needs: 90-110g/d Total Fluid Estimated Needs: 1.4-1.6L/day  Current Nutrition:  Soft diet + nutritional supplements, not meeting PO needs yet   Plan: Transitioned to *Cyclic* TPN on 04/18/90.   to run over 16 hours per MD request. [name] at 2000 per discussion with dietician to allow patient time for walking in afternoon/evenings.  To run over 16 hours: -Start rate at 49 mL/hr for 1  hour. -Increase rate to 98 mL/hr for 13 hours.  -Decrease rate to 49 mL/hr for 1 hour. -Decrease rate to 25 mL/hr for 1 hour, then stop.  Plan:  See cyclic TPN rate above Nutritional Components Amino acids (using 15% Clinisol): 101 g Dextrose 19% = 266 g Lipids (using 20% SMOFlipids): 50.4 g kCal: 1817/24h  Electrolytes in TPN: Na 70 mEq/L, K 21mEq/L, Ca 56mEq/L, Mg 10 mEq/L, Phos 10 mmol/L  Continue ratio Cl:Ac 1:1  Continue trace elements, MVI, chromium, zinc and selenium per dietary recs  Continue CBG/SSI for Cyclic TPN:  -CBG 2 hrs after cyclic TPN start -CBG during middle of cyclic TPN infusion -CBG 1 hr after cylic TPN stopped -CBG while off TPN Continue SSI insulin at moderate scale and add 8 units of insulin in TPN Check TPN labs on Mondays   Bari Mantis PharmD Clinical Pharmacist 12/27/2022

## 2022-12-27 NOTE — Plan of Care (Signed)
  Problem: Nutrition: Goal: Adequate nutrition will be maintained Outcome: Progressing   Problem: Activity: Goal: Risk for activity intolerance will decrease Outcome: Progressing   Problem: Coping: Goal: Level of anxiety will decrease Outcome: Progressing   Problem: Pain Managment: Goal: General experience of comfort will improve Outcome: Progressing   

## 2022-12-27 NOTE — Progress Notes (Addendum)
Patient contacted RN for assistance. RN found patient in bed and crying in pain. Patient stated sharp, severe pain at right side of face shooting down toward neck. Patient also states both hands feel like they contracting. RN administered PRN pain medication. Contacted MD, pending new orders.    Madie Reno, RN

## 2022-12-27 NOTE — Progress Notes (Signed)
[facility] SURGICAL PROGRESS NOTE   Hospital Day(s): 317.   Post op day(s): 8 Days Post-Op.   Interval History:  Patient reports having some mild oozing from the midline area yesterday, but nothing severe.  No further oozing today.  Denies any abdominal pain.  Reports some soreness in the right neck still, but not worsening.  On regular diet and TPN.  Review of Systems:  Constitutional: denies fever, chills  HEENT: denies cough or congestion  Respiratory: denies any shortness of breath  Cardiovascular: denies chest pain or palpitations  Gastrointestinal: denied abdominal pain, denies N/V Genitourinary: denies burning with urination or urinary frequency Integumentary: + midline wound (healing; stable)  Vital signs in last 24 hours: [min-max] current  Temp:  [97.4 F (36.3 C)-98.3 F (36.8 C)] 98.2 F (36.8 C) (04/20 0900) Pulse Rate:  [83-98] 83 (04/20 0900) Resp:  [16-18] 16 (04/20 0900) BP: (99-119)/(49-82) 119/58 (04/20 0900) SpO2:  [97 %-100 %] 97 % (04/20 0900) Weight:  [101.7 kg] 101.7 kg (04/20 0506)     Height:  (149.9 cm) Weight: 101.7 kg BMI (Calculated): 45.26   Intake/Output last 2 shifts:  04/19 0701 - 04/20 0700 In: 1122.7 [P.O.:420; I.V.:702.7] Out: 700 [Drains:650; Stool:50]   Physical Exam:  Constitutional: alert, cooperative and no distress  Respiratory: breathing non-labored at rest  Cardiovascular: regular rate and sinus rhythm  Gastrointestinal: Soft, non-tender, non-distended, no rebound/guarding. Integumentary: Midline wound healing by secondary intention, peritoneum closed; granulating and new skin growing over majority of wound now; there are three stomatized areas visible in the LUQ and LLQ portions of the wound. No active bleeding. Chest: Port to the right chest; mild ecchymosis; no evidence of hematoma or bleed   Labs:     Latest Ref Rng & Units 12/23/2022    5:06 AM 12/22/2022    8:00 AM 12/22/2022    5:47 AM  CBC   WBC 4.0 - 10.5 K/uL 4.1   3.8   Hemoglobin 12.0 - 15.0 g/dL 8.3  5.6  5.8   Hematocrit 36.0 - 46.0 % 24.2  17.9  17.9   Platelets 150 - 400 K/uL 71   70       Latest Ref Rng & Units 12/22/2022    5:47 AM 12/15/2022    6:03 AM 12/08/2022    3:35 AM  CMP  Glucose 70 - 99 mg/dL 161  096  045   BUN 6 - 20 mg/dL Creatinine 0.44 - 1.00 mg/dL 4.09  8.11  9.14   Sodium 135 - 145 mmol/L 131  135  134   Potassium 3.5 - 5.1 mmol/L 3.5  4.2  4.0   Chloride 98 - 111 mmol/L 102  107  107   CO2 22 - 32 mmol/L Calcium 8.9 - 10.3 mg/dL 7.6  8.0  8.1   Total Protein 6.5 - 8.1 g/dL 5.2  5.8  5.8   Total Bilirubin 0.3 - 1.2 mg/dL 0.5  0.5  0.5   Alkaline Phos 38 - 126 U/L 86  132  118   AST 15 - 41 U/L 29  40  35   ALT 0 - 44 U/L 25  32  28     Imaging studies: No new pertinent imaging studies this AM   Assessment/Plan:  57 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  - New 04/20: No acute issues today  - Wound Care ([name] Pouch); Now on smaller [name] pouch; continues managing effluent independently. Intermittent oozing from time to time. Change as needed.    - Continue regular diet as tolerated  - Continue cyclic TPN; weekly nutritional labs - Appreciate dietary assistance - monitoring weight              - Monitor abdominal examination; on-going bowel function            - Pain control prn; antemetic prn            - Progressed with therapies; no longer any recommendations; ambulating well    - Appreciate neurosurgery assistance with compression fracture; serial XRs; nothing further    - Discharge Planning: Continue to anticipate lengthy  admission +/- possible transfer; no new progress regarding disposition unfortunately   I spent 35 minutes dedicated to the care of this patient on the date of this encounter to include pre-visit review of records, face-to-face time with the patient discussing diagnosis and management, and any post-visit coordination of care.  Henrene Dodge, MD [facility]

## 2022-12-28 ENCOUNTER — Inpatient Hospital Stay: Payer: Medicaid Other

## 2022-12-28 LAB — GLUCOSE, CAPILLARY
Glucose-Capillary: 120 mg/dL — ABNORMAL HIGH (ref 70–99)
Glucose-Capillary: 192 mg/dL — ABNORMAL HIGH (ref 70–99)

## 2022-12-28 MED ORDER — ZINC CHLORIDE 1 MG/ML IV SOLN
INTRAVENOUS | Status: AC
  Filled 2022-12-28: qty 672

## 2022-12-28 NOTE — Progress Notes (Signed)
PHARMACY - TOTAL PARENTERAL NUTRITION CONSULT NOTE   Indication: Prolonged ileus  Patient Measurements: Height:  (149.9 cm) Weight: 101.7 kg (224 lb 3.3 oz) IBW/kg (Calculated) : 43.2 TPN AdjBW (KG): 57.8 Body mass index is 45.28 kg/m.  Assessment: Debra [name] is a 57 y.o. female s/p laparotomy, excision of greater omental mass, abdominal wall reconstruction with [name], appendectomy, and placement of Prevena vac. New DVT and PICC was replaced 4/12.   Glucose / Insulin:  --No apparent history of diabetes --BG controlled on current regimen.  --rSSI 4x/day (0500, 1300, 1800, 2200) --SSI last 24h: 13 units  --BG/24h: 133,203 (only ones in lab results-from 4/20) --8 units insulin in TPN Electrolytes: Na 133   Ca 7.7 (corrected Ca 9.3, alb 2.3) Renal: SCr stable at baseline  Hepatic: Liver function within normal limits GI Imaging: 9/11 CTAP: no new acute issues GI Surgeries / Procedures: s/p laparotomy, excision of greater omental mass, abdominal wall reconstruction with [name], appendectomy, and placement of Prevena vac 12/19/22 placement of right IJ port-a-catheter with dual reservoirs also s/p re-opening of laparotomy for repair of small bowel perforation   Central access: 02/15/22 TPN start date: 02/15/22  Nutritional Goals: Goal cyclic TPN over 16 hrs: cyclic TPN over 16 hrs: (provides 102 g of protein and 1811 kcals per day) total volume over 16hrs for calculations=1400 ml (1500 ml total with overfill)  RD Assessment:  Estimated Needs Total Energy Estimated Needs: 1800-2100kcal/day Total Protein Estimated Needs: 90-110g/d Total Fluid Estimated Needs: 1.4-1.6L/day  Current Nutrition:  Soft diet + nutritional supplements, not meeting PO needs yet   Plan: Transitioned to *Cyclic* TPN on 1/91/47.   to run over 16 hours per MD request. [name] at 2000 per discussion with dietician to allow patient time for walking in afternoon/evenings.   To run over 16 hours: -Start rate at 49 mL/hr for 1 hour. -Increase rate to 98 mL/hr for 13 hours.  -Decrease rate to 49 mL/hr for 1 hour. -Decrease rate to 25 mL/hr for 1 hour, then stop.  Plan:  See cyclic TPN rate above Nutritional Components Amino acids (using 15% Clinisol): 101 g Dextrose 19% = 266 g Lipids (using 20% SMOFlipids): 50.4 g kCal: 1817/24h  Electrolytes in TPN: Na 70 mEq/L, K 5mEq/L, Ca 22mEq/L, Mg 10 mEq/L, Phos 10 mmol/L  Continue ratio Cl:Ac 1:1  Continue trace elements, MVI, chromium, zinc and selenium per dietary recs  Continue CBG/SSI for Cyclic TPN:  -CBG 2 hrs after cyclic TPN start -CBG during middle of cyclic TPN infusion -CBG 1 hr after cylic TPN stopped -CBG while off TPN Continue SSI insulin at moderate scale and add 8 units of insulin in TPN Check TPN labs on Mondays   Bari Mantis PharmD Clinical Pharmacist 12/28/2022

## 2022-12-28 NOTE — Progress Notes (Signed)
Called to routinely re-access R- chest dual Portacath.  Medial port with huber needle intact.Flushed port, checked for blood return prior to needle removal. Good blood return obtained. On removing needle, approximately 5ml of yellowish, cloudy drainage tinged with blood came from needle site.Site is bruised, pink around incision, no drainage from incision site. Patient's nurse contacting MD, MD coming to see port. 4x4 gauze with drainage from needle site left in room for MD to see. Tegaderm dressing to portacath

## 2022-12-28 NOTE — Progress Notes (Signed)
Notified by IV Nurse Neva Seat, discharge from port a cath site while attempting to change needle. Notified Dr.. Piscoya, who came to bed side to assess, he also ordered CT. Order placed to re- access port based upon CT results. Vs stable, patient provided with pain medicine.

## 2022-12-28 NOTE — Progress Notes (Signed)
Notified by CNA that pt was vomiting when she got up to the bathroom. CNA reported it was only yellow bile. She has not eaten any supper either. She had po Zofran twice today and last dose was 6pm. Once we got her back to bed she said her nausea was better after a few minutes.  TPN initiated per protocol and flushed easily and had good blood return.  Pt denies any pain in her neck/chest and also denies pain in her abdomen.  Wound bag remains intact with brownish drainage noted in bag.  Dr. Aleen Campi made aware and will make pt NPO for now and get a f/u abd xray in AM.   Hilton Sinclair BSN RN [facility] 12/28/2022, 9:39 PM

## 2022-12-28 NOTE — Progress Notes (Signed)
12/28/22  Asked to see patient this afternoon due to concerns for purulent fluid leaking out of port-a-cath wound while port was getting de-accessed.  The fluid noted on the gauze was serosanguinous and when I was palpating the area, there was no further fluid noted.  There was minimal erythema which seemed more skin irritation due to the tape dressing for the port needle.  The patient had been reporting some pain in the right side of her neck as well, so as a precaution, CT neck was ordered.  CT did not show any cellulitis or abscess, and only some post-op inflammatory changes as expected.  Order placed to re-access her port.  Henrene Dodge, MD

## 2022-12-28 NOTE — Progress Notes (Signed)
[facility] SURGICAL PROGRESS NOTE   Hospital Day(s): 318.   Post op day(s): 9 Days Post-Op.   Interval History:  Patient last night was complaining of a bad headache on her right temporal area and feeling some spasms in her hands.  Labs checked and electrolytes were all normal and Hgb only mildly decreased compared to prior check.  No headache this morning.  Patient reports having some mild oozing from the midline area yesterday as well, but nothing severe.  No further oozing today.  Denies any abdominal pain.  Reports some soreness in the right neck still, but not worsening.  On regular diet and TPN.  Review of Systems:  Constitutional: denies fever, chills  HEENT: denies cough or congestion  Respiratory: denies any shortness of breath  Cardiovascular: denies chest pain or palpitations  Gastrointestinal: denied abdominal pain, denies N/V Genitourinary: denies burning with urination or urinary frequency Integumentary: + midline wound (healing; stable)  Vital signs in last 24 hours: [min-max] current  Temp:  [97.9 F (36.6 C)-98.7 F (37.1 C)] 98.7 F (37.1 C) (04/21 0723) Pulse Rate:  [75-84] 75 (04/21 0723) Resp:  [18-20] 18 (04/21 0723) BP: (94-121)/(59-67) 121/59 (04/21 0723) SpO2:  [98 %-100 %] 98 % (04/21 0723)     Height:  (149.9 cm) Weight: 101.7 kg BMI (Calculated): 45.26   Intake/Output last 2 shifts:  04/20 0701 - 04/21 0700 In: 1347.5 [P.O.:600; I.V.:747.5] Out: 700 [Drains:700]   Physical Exam:  Constitutional: alert, cooperative and no distress  Respiratory: breathing non-labored at rest  Cardiovascular: regular rate and sinus rhythm  Gastrointestinal: Soft, non-tender, non-distended, no rebound/guarding. Integumentary: Midline wound healing by secondary intention, peritoneum closed; granulating and new skin growing over majority of wound now; there are three stomatized areas visible in the LUQ and LLQ portions of the wound. No active  bleeding, but some clot / evidence of prior oozing visible over the superior stoma. Chest: Port to the right chest; mild ecchymosis; no evidence of hematoma or bleed.  Some ecchymosis and swelling over the right neck at the site of catheter insertion into IJ.   Labs:     Latest Ref Rng & Units 12/27/2022    7:57 PM 12/23/2022    5:06 AM 12/22/2022    8:00 AM  CBC  WBC 4.0 - 10.5 K/uL 4.6  4.1    Hemoglobin 12.0 - 15.0 g/dL 7.9  8.3  5.6   Hematocrit 36.0 - 46.0 % 24.6  24.2  17.9   Platelets 150 - 400 K/uL 111  71        Latest Ref Rng & Units 12/27/2022    7:57 PM 12/22/2022    5:47 AM 12/15/2022    6:03 AM  CMP  Glucose 70 - 99 mg/dL 161  096  045   BUN 6 - 20 mg/dL Creatinine 0.44 - 1.00 mg/dL 4.09  8.11  9.14   Sodium 135 - 145 mmol/L 133  131  135   Potassium 3.5 - 5.1 mmol/L 4.4  3.5  4.2   Chloride 98 - 111 mmol/L 106  102  107   CO2 22 - 32 mmol/L Calcium 8.9 - 10.3 mg/dL 7.7  7.6  8.0   Total Protein 6.5 - 8.1 g/dL  5.2  5.8   Total Bilirubin 0.3 - 1.2 mg/dL  0.5  0.5   Alkaline Phos 38 - 126 U/L  86  132  AST 15 - 41 U/L  29  40   ALT 0 - 44 U/L  25  32     Imaging studies: No new pertinent imaging studies this AM   Assessment/Plan:  57 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  - New 04/21: No acute issues today.  Headache resolved overnight and no current bleeding. - May apply ice pack over the right neck to help with discomfort and swelling. - Wound Care ([name] Pouch); Now on smaller [name] pouch; continues managing effluent independently. Intermittent oozing from time to time. Change as needed.    - Continue  regular diet as tolerated  - Continue cyclic TPN; weekly nutritional labs - Appreciate dietary assistance - monitoring weight              - Monitor abdominal examination; on-going bowel function            - Pain control prn; antemetic prn            - Progressed with therapies; no longer any recommendations; ambulating well    - Appreciate neurosurgery assistance with compression fracture; serial XRs; nothing further    - Discharge Planning: Continue to anticipate lengthy admission +/- possible transfer; no new progress regarding disposition unfortunately   I spent 35 minutes dedicated to the care of this patient on the date of this encounter to include pre-visit review of records, face-to-face time with the patient discussing diagnosis and management, and any post-visit coordination of care.  Henrene Dodge, MD [facility]

## 2022-12-29 ENCOUNTER — Inpatient Hospital Stay: Payer: Medicaid Other

## 2022-12-29 LAB — COMPREHENSIVE METABOLIC PANEL
ALT: 26 U/L (ref 0–44)
AST: 33 U/L (ref 15–41)
Albumin: 2.3 g/dL — ABNORMAL LOW (ref 3.5–5.0)
Alkaline Phosphatase: 103 U/L (ref 38–126)
Anion gap: 4 — ABNORMAL LOW (ref 5–15)
BUN: 20 mg/dL (ref 6–20)
CO2: 25 mmol/L (ref 22–32)
Calcium: 7.9 mg/dL — ABNORMAL LOW (ref 8.9–10.3)
Chloride: 103 mmol/L (ref 98–111)
Creatinine, Ser: 0.41 mg/dL — ABNORMAL LOW (ref 0.44–1.00)
GFR, Estimated: >60 mL/min (ref >60)
Glucose, Bld: 222 mg/dL — ABNORMAL HIGH (ref 70–99)
Potassium: 4.5 mmol/L (ref 3.5–5.1)
Sodium: 132 mmol/L — ABNORMAL LOW (ref 135–145)
Total Bilirubin: 0.3 mg/dL (ref 0.3–1.2)
Total Protein: 5.6 g/dL — ABNORMAL LOW (ref 6.5–8.1)

## 2022-12-29 LAB — CBC
HCT: 21.2 % — ABNORMAL LOW (ref 36.0–46.0)
Hemoglobin: 6.8 g/dL — ABNORMAL LOW (ref 12.0–15.0)
MCH: 28 pg (ref 26.0–34.0)
MCHC: 32.1 g/dL (ref 30.0–36.0)
MCV: 87.2 fL (ref 80.0–100.0)
Platelets: 108 10*3/uL — ABNORMAL LOW (ref 150–400)
RBC: 2.43 MIL/uL — ABNORMAL LOW (ref 3.87–5.11)
RDW: 15 % (ref 11.5–15.5)
WBC: 3.1 10*3/uL — ABNORMAL LOW (ref 4.0–10.5)
nRBC: 0 % (ref 0.0–0.2)

## 2022-12-29 LAB — MISC LABCORP TEST (SEND OUT)

## 2022-12-29 LAB — GLUCOSE, CAPILLARY
Glucose-Capillary: 193 mg/dL — ABNORMAL HIGH (ref 70–99)
Glucose-Capillary: 217 mg/dL — ABNORMAL HIGH (ref 70–99)
Glucose-Capillary: 184 mg/dL — ABNORMAL HIGH (ref 70–99)
Glucose-Capillary: 174 mg/dL — ABNORMAL HIGH (ref 70–99)
Glucose-Capillary: 225 mg/dL — ABNORMAL HIGH (ref 70–99)
Glucose-Capillary: 194 mg/dL — ABNORMAL HIGH (ref 70–99)
Glucose-Capillary: 99 mg/dL (ref 70–99)
Glucose-Capillary: 117 mg/dL — ABNORMAL HIGH (ref 70–99)

## 2022-12-29 LAB — MAGNESIUM: Magnesium: 1.9 mg/dL (ref 1.7–2.4)

## 2022-12-29 LAB — PHOSPHORUS: Phosphorus: 2.4 mg/dL — ABNORMAL LOW (ref 2.5–4.6)

## 2022-12-29 MED ORDER — ZINC CHLORIDE 1 MG/ML IV SOLN
INTRAVENOUS | Status: AC
  Filled 2022-12-29: qty 672

## 2022-12-29 NOTE — Progress Notes (Addendum)
[facility] SURGICAL PROGRESS NOTE   Hospital Day(s): 319.   Post op day(s): 10 Days Post-Op.   Interval History:  Patient seen and examined Overnight, episode of emesis when up to the bathroom This morning, she reports nausea improved No additional episode of emesis No abdominal pain, fever, chills  Weekly nutritional labs pending this AM; being drawn  No oozing from ostomy; port site clean  [name] output 100 recorded + unmeasured; managing effluent independently On cyclic TPN NPO this AM Ambulating well; independent  Review of Systems:  Constitutional: denies fever, chills  HEENT: denies cough or congestion  Respiratory: denies any shortness of breath  Cardiovascular: denies chest pain or palpitations  Gastrointestinal: denied abdominal pain, + nausea/emesis overnight (resolved) Genitourinary: denies burning with urination or urinary frequency Integumentary: + midline wound (healing; stable)  Vital signs in last 24 hours: [min-max] current  Temp:  [98.1 F (36.7 C)-98.3 F (36.8 C)] 98.1 F (36.7 C) (04/22 0358) Pulse Rate:  [74-88] 74 (04/22 0358) Resp:  [16-20] 18 (04/22 0358) BP: (96-114)/(49-67) 96/49 (04/22 0358) SpO2:  [98 %-100 %] 99 % (04/22 0358) Weight:  [101.8 kg] 101.8 kg (04/22 0500)     Height:  (149.9 cm) Weight: 101.8 kg BMI (Calculated): 45.3   Intake/Output last 2 shifts:  04/21 0701 - 04/22 0700 In: 790.8 [I.V.:790.8] Out: 100 [Drains:100]   Physical Exam:  Constitutional: alert, cooperative and no distress  Respiratory: breathing non-labored at rest  Cardiovascular: regular rate and sinus rhythm  Gastrointestinal: Soft, non-tender, non-distended, no rebound/guarding. Integumentary: Midline wound healing by secondary intention, peritoneum closed; granulating and new skin growing over majority of wound now; there are three stomatized areas visible in the LUQ and LLQ portions of the wound. No active bleeding. Chest: Port to  the right chest; mild ecchymosis; no evidence of hematoma or bleed   Labs:     Latest Ref Rng & Units 12/27/2022    7:57 PM 12/23/2022    5:06 AM 12/22/2022    8:00 AM  CBC  WBC 4.0 - 10.5 K/uL 4.6  4.1    Hemoglobin 12.0 - 15.0 g/dL 7.9  8.3  5.6   Hematocrit 36.0 - 46.0 % 24.6  24.2  17.9   Platelets 150 - 400 K/uL 111  71        Latest Ref Rng & Units 12/27/2022    7:57 PM 12/22/2022    5:47 AM 12/15/2022    6:03 AM  CMP  Glucose 70 - 99 mg/dL 213  086  578   BUN 6 - 20 mg/dL Creatinine 0.44 - 1.00 mg/dL 4.69  6.29  5.28   Sodium 135 - 145 mmol/L 133  131  135   Potassium 3.5 - 5.1 mmol/L 4.4  3.5  4.2   Chloride 98 - 111 mmol/L 106  102  107   CO2 22 - 32 mmol/L Calcium 8.9 - 10.3 mg/dL 7.7  7.6  8.0   Total Protein 6.5 - 8.1 g/dL  5.2  5.8   Total Bilirubin 0.3 - 1.2 mg/dL  0.5  0.5   Alkaline Phos 38 - 126 U/L  86  132   AST 15 - 41 U/L  29  40   ALT 0 - 44 U/L  25  32     Imaging studies:  KUB (12/29/2022) personally reviewed with a small isolated loop of bowel dilation, no stomach dilation,  no free air, and radiologist report reviewed below:  IMPRESSION: Mild air-and-fluid distention of a single bowel loop at the LEFT lower quadrant.   Nonobstructed bowel-gas pattern.   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  - New 04/22: Nausea/emesis improved this AM, KUB without evidence of obstruction, stomach non-distended. I think it is reasonable to do CLD for now and reassess progress. Otherwise, no acute issues. TPN labs pending --> Hgb 6.8; no gross evidence of bleeding; may need additional  transfusion; has required this from time to time   - Wound Care ([name] Pouch); Now on smaller [name] pouch; continues managing effluent independently. Intermittent oozing from time to time. Change as needed.    - Continue soft diet as tolerated  - Continue cyclic TPN; weekly nutritional labs - Appreciate dietary assistance - monitoring weight              - Monitor abdominal examination; on-going bowel function            - Pain control prn; antemetic prn            - Progressed with therapies; no longer any recommendations; ambulating well    - Appreciate neurosurgery assistance with compression fracture; serial XRs; nothing further    - Discharge Planning: Continue to anticipate lengthy admission +/- possible transfer; no new progress regarding disposition unfortunately   All of the above findings and recommendations were discussed with the patient, and the medical team, and all of patient's questions were answered to her expressed satisfaction.  -- [name], PA-C [facility] 12/29/2022, 7:26 AM M-F: 7am - 4pm

## 2022-12-29 NOTE — Progress Notes (Signed)
Order received from Laqueta Due PA for Clear liquid diet

## 2022-12-29 NOTE — Progress Notes (Signed)
PHARMACY - TOTAL PARENTERAL NUTRITION CONSULT NOTE   Indication: Prolonged ileus  Patient Measurements: Height:  (149.9 cm) Weight: 101.8 kg (224 lb 6.9 oz) IBW/kg (Calculated) : 43.2 TPN AdjBW (KG): 57.8 Body mass index is 45.33 kg/m.  Assessment: Debra [name] is a 57 y.o. female s/p laparotomy, excision of greater omental mass, abdominal wall reconstruction with [name], appendectomy, and placement of Prevena vac. New DVT and PICC was replaced 4/12.   Glucose / Insulin:  --No apparent history of diabetes --BG controlled on current regimen.  --rSSI 4x/day (0500, 1300, 1800, 2200) --SSI last 24h: 11 units  --BG/24h: 120 - 225  --8 units insulin in TPN Electrolytes: Na low at baseline, stable: increased slightly in TPN 4/22 Renal: SCr stable at baseline  Hepatic: Liver function within normal limits GI Imaging: 9/11 CTAP: no new acute issues GI Surgeries / Procedures: s/p laparotomy, excision of greater omental mass, abdominal wall reconstruction with [name], appendectomy, and placement of Prevena vac 12/19/22 placement of right IJ port-a-catheter with dual reservoirs also s/p re-opening of laparotomy for repair of small bowel perforation   Central access: 02/15/22 TPN start date: 02/15/22  Nutritional Goals: Goal cyclic TPN over 16 hrs: cyclic TPN over 16 hrs: (provides 102 g of protein and 1811 kcals per day) total volume over 16hrs for calculations=1400 ml (1500 ml total with overfill)  RD Assessment:  Estimated Needs Total Energy Estimated Needs: 1800-2100kcal/day Total Protein Estimated Needs: 90-110g/d Total Fluid Estimated Needs: 1.4-1.6L/day  Current Nutrition:  Soft diet + nutritional supplements, not meeting PO needs yet   Plan: Transitioned to *Cyclic* TPN on 1/61/09.   to run over 16 hours per MD request. Debra [name] at 2000 per discussion with dietician to allow patient time for walking in afternoon/evenings.  To run over 16  hours: -Start rate at 49 mL/hr for 1 hour. -Increase rate to 98 mL/hr for 13 hours.  -Decrease rate to 49 mL/hr for 1 hour. -Decrease rate to 25 mL/hr for 1 hour, then stop.  Plan:  See cyclic TPN rate above Nutritional Components Amino acids (using 15% Clinisol): 101 g Dextrose 19% = 266 g Lipids (using 20% SMOFlipids): 50.4 g kCal: 1817/24h  Electrolytes in TPN: Na 75 mEq/L, K 20mEq/L, Ca 31mEq/L, Mg 10 mEq/L, Phos 10 mmol/L  Continue ratio Cl:Ac 1:1  Continue trace elements, MVI, chromium, zinc and selenium per dietary recs  Continue CBG/SSI for Cyclic TPN:  -CBG 2 hrs after cyclic TPN start -CBG during middle of cyclic TPN infusion -CBG 1 hr after cylic TPN stopped -CBG while off TPN Continue SSI insulin at moderate scale and add 8 units of insulin in TPN Check TPN labs on Mondays   Bari Mantis PharmD Clinical Pharmacist 12/29/2022

## 2022-12-30 LAB — CBC
HCT: 20.9 % — ABNORMAL LOW (ref 36.0–46.0)
Hemoglobin: 6.6 g/dL — ABNORMAL LOW (ref 12.0–15.0)
MCH: 27.8 pg (ref 26.0–34.0)
MCHC: 31.6 g/dL (ref 30.0–36.0)
MCV: 88.2 fL (ref 80.0–100.0)
Platelets: 85 10*3/uL — ABNORMAL LOW (ref 150–400)
RBC: 2.37 MIL/uL — ABNORMAL LOW (ref 3.87–5.11)
RDW: 15.4 % (ref 11.5–15.5)
WBC: 3 10*3/uL — ABNORMAL LOW (ref 4.0–10.5)
nRBC: 0 % (ref 0.0–0.2)

## 2022-12-30 LAB — BPAM RBC: Unit Type and Rh: 9500

## 2022-12-30 LAB — GLUCOSE, CAPILLARY
Glucose-Capillary: 209 mg/dL — ABNORMAL HIGH (ref 70–99)
Glucose-Capillary: 139 mg/dL — ABNORMAL HIGH (ref 70–99)
Glucose-Capillary: 102 mg/dL — ABNORMAL HIGH (ref 70–99)
Glucose-Capillary: 87 mg/dL (ref 70–99)

## 2022-12-30 LAB — PREPARE RBC (CROSSMATCH)

## 2022-12-30 LAB — TYPE AND SCREEN: Unit division: 0

## 2022-12-30 MED ORDER — ZINC CHLORIDE 1 MG/ML IV SOLN
INTRAVENOUS | Status: AC
  Filled 2022-12-30: qty 672

## 2022-12-30 MED ORDER — SODIUM CHLORIDE 0.9% IV SOLUTION: Freq: Once | INTRAVENOUS | Status: AC

## 2022-12-30 NOTE — Progress Notes (Signed)
PHARMACY - TOTAL PARENTERAL NUTRITION CONSULT NOTE   Indication: Prolonged ileus  Patient Measurements: Height:  (149.9 cm) Weight: 102.7 kg (226 lb 6.6 oz) IBW/kg (Calculated) : 43.2 TPN AdjBW (KG): 57.8 Body mass index is 45.73 kg/m.  Assessment: Debra [name] is a 57 y.o. female s/p laparotomy, excision of greater omental mass, abdominal wall reconstruction with [name], appendectomy, and placement of Prevena vac. New DVT and PICC was replaced 4/12.   Glucose / Insulin:  --No apparent history of diabetes --BG controlled on current regimen.  --rSSI 4x/day (0500, 1300, 1800, 2200) --SSI last 24h: 8 units  --BG/24h: 99 - 225  --8 units insulin in TPN Electrolytes: Na low at baseline, stable: increased slightly in TPN 4/22 Renal: SCr stable at baseline  Hepatic: Liver function within normal limits GI Imaging: 9/11 CTAP: no new acute issues GI Surgeries / Procedures: s/p laparotomy, excision of greater omental mass, abdominal wall reconstruction with [name], appendectomy, and placement of Prevena vac 12/19/22 placement of right IJ port-a-catheter with dual reservoirs also s/p re-opening of laparotomy for repair of small bowel perforation   Central access: 02/15/22 TPN start date: 02/15/22  Nutritional Goals: Goal cyclic TPN over 16 hrs: cyclic TPN over 16 hrs: (provides 102 g of protein and 1811 kcals per day) total volume over 16hrs for calculations=1400 ml (1500 ml total with overfill)  RD Assessment:  Estimated Needs Total Energy Estimated Needs: 1800-2100kcal/day Total Protein Estimated Needs: 90-110g/d Total Fluid Estimated Needs: 1.4-1.6L/day  Current Nutrition:  Soft diet + nutritional supplements, not meeting PO needs yet   Plan: Transitioned to *Cyclic* TPN on 9/60/45.   to run over 16 hours per MD request. [name] at 2000 per discussion with dietician to allow patient time for walking in afternoon/evenings.  To run over 16  hours: -Start rate at 49 mL/hr for 1 hour. -Increase rate to 98 mL/hr for 13 hours.  -Decrease rate to 49 mL/hr for 1 hour. -Decrease rate to 25 mL/hr for 1 hour, then stop.  Plan:  See cyclic TPN rate above Nutritional Components Amino acids (using 15% Clinisol): 101 g Dextrose 19% = 266 g Lipids (using 20% SMOFlipids): 50.4 g kCal: 1817/24h  Electrolytes in TPN: Na 75 mEq/L, K 35mEq/L, Ca 47mEq/L, Mg 10 mEq/L, Phos 10 mmol/L  Continue ratio Cl:Ac 1:1  Continue trace elements, MVI, chromium, zinc and selenium per dietary recs  Continue CBG/SSI for Cyclic TPN:  -CBG 2 hrs after cyclic TPN start -CBG during middle of cyclic TPN infusion -CBG 1 hr after cylic TPN stopped -CBG while off TPN Continue SSI insulin at moderate scale and add 8 units of insulin in TPN Check TPN labs on Mondays  [name] 12/30/2022

## 2022-12-30 NOTE — Progress Notes (Signed)
[facility] SURGICAL PROGRESS NOTE   Hospital Day(s): 320.   Post op day(s): 11 Days Post-Op.   Interval History:  Patient seen and examined No issues overnight  This morning, she reports discomfort in right chest/neck around port; this is ecchymotic; swelling improved  No abdominal pain, fever, chills, nausea, emesis No oozing from ostomy; port site clean  [name] output 100 recorded + unmeasured; managing effluent independently On cyclic TPN On CLD - wishes to return to soft foods  Ambulating well; independent  Review of Systems:  Constitutional: denies fever, chills  HEENT: denies cough or congestion  Respiratory: denies any shortness of breath  Cardiovascular: denies chest pain or palpitations  Gastrointestinal: denied abdominal pain, denied N/V Genitourinary: denies burning with urination or urinary frequency Integumentary: + midline wound (healing; stable)  Vital signs in last 24 hours: [min-max] current  Temp:  [97.9 F (36.6 C)-98.3 F (36.8 C)] 98.1 F (36.7 C) (04/23 0314) Pulse Rate:  [63-91] 91 (04/23 0314) Resp:  [16-19] 19 (04/23 0314) BP: (93-120)/(51-61) 120/58 (04/23 0314) SpO2:  [98 %-100 %] 98 % (04/23 0314) Weight:  [102.7 kg] 102.7 kg (04/23 0145)     Height:  (149.9 cm) Weight: 102.7 kg BMI (Calculated): 45.71   Intake/Output last 2 shifts:  04/22 0701 - 04/23 0700 In: 536.9 [I.V.:536.9] Out: 100 [Drains:100]   Physical Exam:  Constitutional: alert, cooperative and no distress  Respiratory: breathing non-labored at rest  Cardiovascular: regular rate and sinus rhythm  Gastrointestinal: Soft, non-tender, non-distended, no rebound/guarding. Integumentary: Midline wound healing by secondary intention, peritoneum closed; granulating and new skin growing over majority of wound now; there are three stomatized areas visible in the LUQ and LLQ portions of the wound. No active bleeding Chest: Port to the right chest; mild ecchymosis;  no evidence of hematoma or bleed; previous swelling improved   Labs:     Latest Ref Rng & Units 12/29/2022    7:22 AM 12/27/2022    7:57 PM 12/23/2022    5:06 AM  CBC  WBC 4.0 - 10.5 K/uL 3.1  4.6  4.1   Hemoglobin 12.0 - 15.0 g/dL 6.8  7.9  8.3   Hematocrit 36.0 - 46.0 % 21.2  24.6  24.2   Platelets 150 - 400 K/uL 108  111  71       Latest Ref Rng & Units 12/29/2022    7:22 AM 12/27/2022    7:57 PM 12/22/2022    5:47 AM  CMP  Glucose 70 - 99 mg/dL 191  478  295   BUN 6 - 20 mg/dL Creatinine 0.44 - 1.00 mg/dL 6.21  3.08  6.57   Sodium 135 - 145 mmol/L 132  133  131   Potassium 3.5 - 5.1 mmol/L 4.5  4.4  3.5   Chloride 98 - 111 mmol/L 103  106  102   CO2 22 - 32 mmol/L Calcium 8.9 - 10.3 mg/dL 7.9  7.7  7.6   Total Protein 6.5 - 8.1 g/dL 5.6   5.2   Total Bilirubin 0.3 - 1.2 mg/dL 0.3   0.5   Alkaline Phos 38 - 126 U/L 103   86   AST 15 - 41 U/L 33   29   ALT 0 - 44 U/L 26   25     Imaging studies: No new imaging studies this AM   Assessment/Plan:  57 y.o. female with  high output enterocutaneous fistula 11 Days Post-Op placement of right IJ port-a-catheter with dual reservoirs also s/p re-opening of laparotomy for repair of small bowel perforation following initial laparotomy, excision of greater omental mass, abdominal wall reconstruction with [name], appendectomy, and placement of Prevena vac on 06/08.  - New 04/23:  - Recheck H&H; may need transfusion again; likely combination of dilution and anemia of chronic disease.  - Advance to soft diet  - Wound Care ([name] Pouch); Now on smaller [name] pouch; continues managing effluent independently. Intermittent oozing from time to time. Change as needed.    - Continue soft diet as tolerated  - Continue cyclic TPN; weekly nutritional labs - Appreciate dietary assistance - monitoring weight              - Monitor abdominal examination; on-going bowel function            - Pain control  prn; antemetic prn            - Progressed with therapies; no longer any recommendations; ambulating well    - Appreciate neurosurgery assistance with compression fracture; serial XRs; nothing further    - Discharge Planning: Continue to anticipate lengthy admission +/- possible transfer; no new progress regarding disposition unfortunately   All of the above findings and recommendations were discussed with the patient, and the medical team, and all of patient's questions were answered to her expressed satisfaction.  -- [name], PA-C [facility] 12/30/2022, 7:41 AM M-F: 7am - 4pm

## 2022-12-30 NOTE — Progress Notes (Signed)
Nutrition Follow-up  DOCUMENTATION CODES:   Obesity unspecified  INTERVENTION:   Continue cyclic TPN per pharmacy (16 hrs)- provides 1811kcal/day and 102g/day protein   Ensure Enlive po BID, each supplement provides 350 kcal and 20 grams of protein.  MVI, chromium and trace elements in TPN   Triglycerides checked monthly   Daily weights   Continue Zinc additional  daily added to TPN   Continue Selenium additional daily added to TPN   Add Vitamin A 10,000 units po daily   Will check routine vitamin labs every 3 months, next due 7/11  NUTRITION DIAGNOSIS:   Increased nutrient needs related to wound healing, catabolic illness as evidenced by estimated needs. -ongoing   GOAL:   Patient will meet greater than or equal to 90% of their needs -met with TPN   MONITOR:   PO intake, Supplement acceptance, Labs, Weight trends, Diet advancement, I & O's, TPN  ASSESSMENT:   57 y/o female with h/o hypothyroidism, COVID 19 (3/21), kidney stones and stage 3 colon cancer (s/p left hemicolectomy 5/21 and chemotherapy) who is admitted for new pelvic mass now s/p laparotomy 6/8 (with excision of pelvic mass from greater omentum, abdominal wall reconstruction with bilateral myocutaneous flaps and mesh, incisional hernia repair, appendectomy repair and VAC placement) complicated by bowel perforation s/p reopening of recent laparotomy 6/10 (with repair of small bowel perforation, excision of mesh, placement of two phasix mesh and VAC placement). Pathology returned as metastatic adenocarcinoma. Pt with L1 compression fracture.   Pt s/p right IJ port 4/12  Pt continues to tolerate TPN well at goal rate; TPN is currently being cycled for 16 hrs via port. Port noted to have discharge and swelling around site. Triglycerides wnl and are being checked monthly. Hyperglycemia improved with insulin in TPN. Pt up ~29lbs since admission; PA aware. Will check routine vitamin labs every 3 months as  pt on chronic TPN (next due 7/11). Pt noted to have one episode of nausea with emesis on 4/21; nausea seems resolved now. Pt downgraded to a clear liquid diet 4/11 but has now advanced to a soft diet. Pt continues to have poor oral intake; pt eating <25% of meals. [name] pouch in place with plus unmeasured output. No discharge plan in place.   Medications reviewed and include: insulin, synthroid, protonix, vitamin A, TPN  -Selenium 81(L), zinc 44 wnl, -Chromium- 2.0 wnl, copper- 109 wnl, Manganese- pend, vitamin D 74.99 wnl, Iodine 70 wnl, aluminum 10(H)- 4/12 -Iron 25(L), TIBC 398, ferritin 46 wnl, folate 29.0 wnl, B12 922(H)- 4/12 -Vitamin B1- 73.2 wnl, vitamin A- 11.2(L), B6- 8.9 wnl, vitamin C- 0.9 wnl, vitamin E- 12.6 wnl, vitamin K- 0.49 wnl- 4/12 -Carnitine 32.0 wnl- 4/12  Labs reviewed: Na 132(L), K 4.5 wnl, creat 0.41(L), P 2.4(L), Mg 1.9 wnl- 4/22 Prealbumin- 11(L)- 3/31 Triglycerides- 129- 3/4 Wbc- 3.0(L), Hgb 6.6(L), Hct 20.9(L) Cbgs- 139, 209 x 24 hrs  Diet Order:    Diet Order             DIET SOFT Fluid consistency: Thin  Diet effective now                  EDUCATION NEEDS:   Not appropriate for education at this time  Skin:  Skin Assessment: Reviewed RN Assessment ( Midline wound: 11cm x 12cm )  Last BM:  4/22- type 6  Height:   Ht Readings from Last 1 Encounters:  12/19/22  (1.499 m)    Weight:  Wt Readings from Last 1 Encounters:  12/30/22 102.7 kg    Ideal Body Weight:  44.3 kg  BMI:  Body mass index is 45.73 kg/m.  Estimated Nutritional Needs:   Kcal:  1800-2100kcal/day  Protein:  90-110g/d  Fluid:  1.4-1.6L/day  Betsey Holiday MS, RD, LDN Please refer to [facility] for RD and/or RD on-call/weekend/after hours pager

## 2022-12-31 LAB — TYPE AND SCREEN
ABO/RH(D): O POS
Antibody Screen: NEGATIVE

## 2022-12-31 LAB — GLUCOSE, CAPILLARY
Glucose-Capillary: 171 mg/dL — ABNORMAL HIGH (ref 70–99)
Glucose-Capillary: 146 mg/dL — ABNORMAL HIGH (ref 70–99)
Glucose-Capillary: 92 mg/dL (ref 70–99)

## 2022-12-31 LAB — CBC
HCT: 24.1 % — ABNORMAL LOW (ref 36.0–46.0)
Hemoglobin: 7.9 g/dL — ABNORMAL LOW (ref 12.0–15.0)
MCH: 28.9 pg (ref 26.0–34.0)
MCHC: 32.8 g/dL (ref 30.0–36.0)
MCV: 88.3 fL (ref 80.0–100.0)
Platelets: 93 10*3/uL — ABNORMAL LOW (ref 150–400)
RBC: 2.73 MIL/uL — ABNORMAL LOW (ref 3.87–5.11)
RDW: 15.1 % (ref 11.5–15.5)
WBC: 3.5 10*3/uL — ABNORMAL LOW (ref 4.0–10.5)
nRBC: 0 % (ref 0.0–0.2)

## 2022-12-31 LAB — BPAM RBC
Blood Product Expiration Date: 202405072359
ISSUE DATE / TIME: 202404231327

## 2022-12-31 MED ORDER — ZINC CHLORIDE 1 MG/ML IV SOLN
INTRAVENOUS | Status: AC
  Filled 2022-12-31: qty 672

## 2022-12-31 NOTE — Progress Notes (Signed)
[facility] SURGICAL PROGRESS NOTE   Hospital Day(s): 321.   Post op day(s): 12 Days Post-Op.   Interval History:  Patient seen and examined No issues overnight  No abdominal pain, fever, chills, nausea, emesis Hgb to 7.9 No oozing from ostomy; port site clean  [name] output 500 recorded + unmeasured; managing effluent independently On cyclic TPN Back on soft diet Ambulating well; independent  Review of Systems:  Constitutional: denies fever, chills  HEENT: denies cough or congestion  Respiratory: denies any shortness of breath  Cardiovascular: denies chest pain or palpitations  Gastrointestinal: denied abdominal pain, denied N/V Genitourinary: denies burning with urination or urinary frequency Integumentary: + midline wound (healing; stable)  Vital signs in last 24 hours: [min-max] current  Temp:  [98.3 F (36.8 C)-98.8 F (37.1 C)] 98.3 F (36.8 C) (04/24 0600) Pulse Rate:  [69-78] 70 (04/24 0600) Resp:  [18] 18 (04/24 0600) BP: (104-115)/(56-70) 106/57 (04/24 0600) SpO2:  [91 %-98 %] 97 % (04/24 0600) Weight:  [102.7 kg] 102.7 kg (04/24 0430)     Height:  (149.9 cm) Weight: 102.7 kg BMI (Calculated): 45.71   Intake/Output last 2 shifts:  04/23 0701 - 04/24 0700 In: 912.4 [P.O.:120; I.V.:730.4; Blood:62] Out: 500 [Stool:500]   Physical Exam:  Constitutional: alert, cooperative and no distress  Respiratory: breathing non-labored at rest  Cardiovascular: regular rate and sinus rhythm  Gastrointestinal: Soft, non-tender, non-distended, no rebound/guarding. Integumentary: Midline wound healing by secondary intention, peritoneum closed; granulating and new skin growing over majority of wound now; there are three stomatized areas visible in the LUQ and LLQ portions of the wound. No active bleeding Chest: Port to the right chest; mild ecchymosis (healing); no evidence of hematoma or bleed; previous swelling improved   Labs:     Latest Ref Rng &  Units 12/31/2022    5:53 AM 12/30/2022   10:35 AM 12/29/2022    7:22 AM  CBC  WBC 4.0 - 10.5 K/uL 3.5  3.0  3.1   Hemoglobin 12.0 - 15.0 g/dL 7.9  6.6  6.8   Hematocrit 36.0 - 46.0 % 24.1  20.9  21.2   Platelets 150 - 400 K/uL 93  85  108       Latest Ref Rng & Units 12/29/2022    7:22 AM 12/27/2022    7:57 PM 12/22/2022    5:47 AM  CMP  Glucose 70 - 99 mg/dL 161  096  045   BUN 6 - 20 mg/dL Creatinine 0.44 - 1.00 mg/dL 4.09  8.11  9.14   Sodium 135 - 145 mmol/L 132  133  131   Potassium 3.5 - 5.1 mmol/L 4.5  4.4  3.5   Chloride 98 - 111 mmol/L 103  106  102   CO2 22 - 32 mmol/L Calcium 8.9 - 10.3 mg/dL 7.9  7.7  7.6   Total Protein 6.5 - 8.1 g/dL 5.6   5.2   Total Bilirubin 0.3 - 1.2 mg/dL 0.3   0.5   Alkaline Phos 38 - 126 U/L 103   86   AST 15 - 41 U/L 33   29   ALT 0 - 44 U/L 26   25     Imaging studies: No new imaging studies this AM   Assessment/Plan:  57 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  - New 04/24: Hgb responded to transfusion; now 7.9 (from 6.6). Otherwise, nothing acute   - Wound Care ([name] Pouch); Now on smaller [name] pouch; continues managing effluent independently. Intermittent oozing from time to time. Change as needed.    - Continue soft diet as tolerated  - Continue cyclic TPN; weekly nutritional labs - Appreciate dietary assistance - monitoring weight              - Monitor abdominal examination; on-going bowel function            - Pain control prn; antemetic prn            - Progressed with therapies; no longer any recommendations; ambulating well    - Discharge Planning: Continue to  anticipate lengthy admission +/- possible transfer; no new progress regarding disposition unfortunately   All of the above findings and recommendations were discussed with the patient, and the medical team, and all of patient's questions were answered to her expressed satisfaction.  -- [name], PA-C [facility] 12/31/2022, 7:13 AM M-F: 7am - 4pm

## 2022-12-31 NOTE — Progress Notes (Signed)
PHARMACY - TOTAL PARENTERAL NUTRITION CONSULT NOTE   Indication: Prolonged ileus  Patient Measurements: Height:  (149.9 cm) Weight: 102.7 kg (226 lb 6.6 oz) IBW/kg (Calculated) : 43.2 TPN AdjBW (KG): 57.8 Body mass index is 45.73 kg/m.  Assessment: Debra [name] is a 57 y.o. female s/p laparotomy, excision of greater omental mass, abdominal wall reconstruction with [name], appendectomy, and placement of Prevena vac. New DVT and PICC was replaced 4/12.   Glucose / Insulin:  --No apparent history of diabetes --BG controlled on current regimen.  --rSSI 4x/day (0500, 1300, 1800, 2200) --SSI last 24h: 5 units  --BG/24h: 99 - 225  --8 units insulin in TPN Electrolytes: Na low at baseline, stable: increased slightly in TPN 4/22 Renal: SCr stable at baseline  Hepatic: Liver function within normal limits GI Imaging: 9/11 CTAP: no new acute issues GI Surgeries / Procedures: s/p laparotomy, excision of greater omental mass, abdominal wall reconstruction with [name], appendectomy, and placement of Prevena vac 12/19/22 placement of right IJ port-a-catheter with dual reservoirs also s/p re-opening of laparotomy for repair of small bowel perforation   Central access: 02/15/22 TPN start date: 02/15/22  Nutritional Goals: Goal cyclic TPN over 16 hrs: cyclic TPN over 16 hrs: (provides 102 g of protein and 1811 kcals per day) total volume over 16hrs for calculations=1400 ml (1500 ml total with overfill)  RD Assessment:  Estimated Needs Total Energy Estimated Needs: 1800-2100kcal/day Total Protein Estimated Needs: 90-110g/d Total Fluid Estimated Needs: 1.4-1.6L/day  Current Nutrition:  Soft diet + nutritional supplements, not meeting PO needs yet   Plan: Transitioned to *Cyclic* TPN on 1/61/09.   to run over 16 hours per MD request. [name] at 2000 per discussion with dietician to allow patient time for walking in afternoon/evenings.  To run over 16  hours: -Start rate at 49 mL/hr for 1 hour. -Increase rate to 98 mL/hr for 13 hours.  -Decrease rate to 49 mL/hr for 1 hour. -Decrease rate to 25 mL/hr for 1 hour, then stop.  Plan:  See cyclic TPN rate above Nutritional Components Amino acids (using 15% Clinisol): 101 g Dextrose 19% = 266 g Lipids (using 20% SMOFlipids): 50.4 g kCal: 1817/24h  Electrolytes in TPN: Na 75 mEq/L, K 62mEq/L, Ca 2mEq/L, Mg 10 mEq/L, Phos 10 mmol/L  Continue ratio Cl:Ac 1:1  Continue trace elements, MVI, chromium, zinc and selenium per dietary recs  Continue CBG/SSI for Cyclic TPN:  -CBG 2 hrs after cyclic TPN start -CBG during middle of cyclic TPN infusion -CBG 1 hr after cylic TPN stopped -CBG while off TPN Continue SSI insulin at moderate scale and add 8 units of insulin in TPN Check TPN labs on Mondays  [name] 12/31/2022

## 2023-01-01 LAB — GLUCOSE, CAPILLARY
Glucose-Capillary: 123 mg/dL — ABNORMAL HIGH (ref 70–99)
Glucose-Capillary: 160 mg/dL — ABNORMAL HIGH (ref 70–99)
Glucose-Capillary: 117 mg/dL — ABNORMAL HIGH (ref 70–99)
Glucose-Capillary: 118 mg/dL — ABNORMAL HIGH (ref 70–99)
Glucose-Capillary: 124 mg/dL — ABNORMAL HIGH (ref 70–99)

## 2023-01-01 MED ORDER — ZINC CHLORIDE 1 MG/ML IV SOLN
INTRAVENOUS | Status: AC
  Filled 2023-01-01: qty 672

## 2023-01-01 MED ORDER — CHLORHEXIDINE GLUCONATE CLOTH 2 % EX PADS
6.0000 | MEDICATED_PAD | Freq: Every day | CUTANEOUS | Status: DC
  Administered 2023-01-01 – 2023-01-05 (×5): 6 via TOPICAL

## 2023-01-01 NOTE — Progress Notes (Signed)
[facility] SURGICAL PROGRESS NOTE   Hospital Day(s): 322.   Post op day(s): 13 Days Post-Op.   Interval History:  Patient seen and examined No issues overnight  No abdominal pain, fever, chills, nausea, emesis No new labs this morning  No oozing from ostomy; port site clean  [name] output 200 recorded + unmeasured; managing effluent independently On cyclic TPN + soft diet  Ambulating well; independent  Review of Systems:  Constitutional: denies fever, chills  HEENT: denies cough or congestion  Respiratory: denies any shortness of breath  Cardiovascular: denies chest pain or palpitations  Gastrointestinal: denied abdominal pain, denied N/V Genitourinary: denies burning with urination or urinary frequency Integumentary: + midline wound (healing; stable)  Vital signs in last 24 hours: [min-max] current  Temp:  [97.8 F (36.6 C)-98 F (36.7 C)] 98 F (36.7 C) (04/25 0454) Pulse Rate:  [68-76] 70 (04/25 0454) Resp:  [15-19] 18 (04/25 0454) BP: (105-121)/(56-66) 105/57 (04/25 0454) SpO2:  [98 %-100 %] 98 % (04/25 0454) Weight:  [103 kg] 103 kg (04/25 0500)     Height:  (149.9 cm) Weight: 103 kg BMI (Calculated): 45.84   Intake/Output last 2 shifts:  04/24 0701 - 04/25 0700 In: 1213.6 [P.O.:360; I.V.:853.6] Out: 200 [Drains:200]   Physical Exam:  Constitutional: alert, cooperative and no distress  Respiratory: breathing non-labored at rest  Cardiovascular: regular rate and sinus rhythm  Gastrointestinal: Soft, non-tender, non-distended, no rebound/guarding. Integumentary: Midline wound healing by secondary intention, peritoneum closed; granulating and new skin growing over majority of wound now; there are three stomatized areas visible in the LUQ and LLQ portions of the wound. No active bleeding Chest: Port to the right chest; mild ecchymosis (healing); no evidence of hematoma or bleed; previous swelling improved   Labs:     Latest Ref Rng & Units  12/31/2022    5:53 AM 12/30/2022   10:35 AM 12/29/2022    7:22 AM  CBC  WBC 4.0 - 10.5 K/uL 3.5  3.0  3.1   Hemoglobin 12.0 - 15.0 g/dL 7.9  6.6  6.8   Hematocrit 36.0 - 46.0 % 24.1  20.9  21.2   Platelets 150 - 400 K/uL 93  85  108       Latest Ref Rng & Units 12/29/2022    7:22 AM 12/27/2022    7:57 PM 12/22/2022    5:47 AM  CMP  Glucose 70 - 99 mg/dL 161  096  045   BUN 6 - 20 mg/dL Creatinine 0.44 - 1.00 mg/dL 4.09  8.11  9.14   Sodium 135 - 145 mmol/L 132  133  131   Potassium 3.5 - 5.1 mmol/L 4.5  4.4  3.5   Chloride 98 - 111 mmol/L 103  106  102   CO2 22 - 32 mmol/L Calcium 8.9 - 10.3 mg/dL 7.9  7.7  7.6   Total Protein 6.5 - 8.1 g/dL 5.6   5.2   Total Bilirubin 0.3 - 1.2 mg/dL 0.3   0.5   Alkaline Phos 38 - 126 U/L 103   86   AST 15 - 41 U/L 33   29   ALT 0 - 44 U/L 26   25     Imaging studies: No new imaging studies this AM   Assessment/Plan:  57 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  - New 04/25: Nothing acute   - Wound Care ([name] Pouch); Now on smaller [name] pouch; continues managing effluent independently. Intermittent oozing from time to time. Change as needed.    - Continue soft diet as tolerated  - Continue cyclic TPN; weekly nutritional labs - Appreciate dietary assistance - monitoring weight              - Monitor abdominal examination; on-going bowel function            - Pain control prn; antemetic prn            - Progressed with therapies; no longer any recommendations; ambulating well    - Discharge Planning: Continue to anticipate lengthy admission +/- possible transfer; no new progress  regarding disposition unfortunately   All of the above findings and recommendations were discussed with the patient, and the medical team, and all of patient's questions were answered to her expressed satisfaction.  -- [name], PA-C [facility] 01/01/2023, 7:58 AM M-F: 7am - 4pm

## 2023-01-01 NOTE — Progress Notes (Signed)
PHARMACY - TOTAL PARENTERAL NUTRITION CONSULT NOTE   Indication: Prolonged ileus  Patient Measurements: Height:  (149.9 cm) Weight: 103 kg (227 lb 1.2 oz) IBW/kg (Calculated) : 43.2 TPN AdjBW (KG): 57.8 Body mass index is 45.86 kg/m.  Assessment: Debra [name] is a 57 y.o. female s/p laparotomy, excision of greater omental mass, abdominal wall reconstruction with [name], appendectomy, and placement of Prevena vac. New DVT and PICC was replaced 4/12.   Glucose / Insulin:  --No apparent history of diabetes --BG controlled on current regimen.  --rSSI 4x/day (0500, 1300, 1800, 2200) --SSI last 24h: 7 units  --BG/24h: 92 - 160  --8 units insulin in TPN Electrolytes: Na low at baseline, stable: increased slightly in TPN 4/22 Renal: SCr stable at baseline  Hepatic: Liver function within normal limits GI Imaging: 9/11 CTAP: no new acute issues GI Surgeries / Procedures: s/p laparotomy, excision of greater omental mass, abdominal wall reconstruction with [name], appendectomy, and placement of Prevena vac 12/19/22 placement of right IJ port-a-catheter with dual reservoirs also s/p re-opening of laparotomy for repair of small bowel perforation   Central access: 02/15/22 TPN start date: 02/15/22  Nutritional Goals: Goal cyclic TPN over 16 hrs: cyclic TPN over 16 hrs: (provides 102 g of protein and 1811 kcals per day) total volume over 16hrs for calculations=1400 ml (1500 ml total with overfill)  RD Assessment:  Estimated Needs Total Energy Estimated Needs: 1800-2100kcal/day Total Protein Estimated Needs: 90-110g/d Total Fluid Estimated Needs: 1.4-1.6L/day  Current Nutrition:  Soft diet + nutritional supplements, not meeting PO needs yet   Plan: Transitioned to *Cyclic* TPN on 1/61/09.   to run over 16 hours per MD request. [name] at 2000 per discussion with dietician to allow patient time for walking in afternoon/evenings.  To run over 16  hours: -Start rate at 49 mL/hr for 1 hour. -Increase rate to 98 mL/hr for 13 hours.  -Decrease rate to 49 mL/hr for 1 hour. -Decrease rate to 25 mL/hr for 1 hour, then stop.  Plan:  See cyclic TPN rate above Nutritional Components Amino acids (using 15% Clinisol): 101 g Dextrose 19% = 266 g Lipids (using 20% SMOFlipids): 50.4 g kCal: 1817/24h  Electrolytes in TPN: Na 75 mEq/L, K 81mEq/L, Ca 23mEq/L, Mg 10 mEq/L, Phos 10 mmol/L  Continue ratio Cl:Ac 1:1  Continue trace elements, MVI, chromium, zinc and selenium per dietary recs  Continue CBG/SSI for Cyclic TPN:  -CBG 2 hrs after cyclic TPN start -CBG during middle of cyclic TPN infusion -CBG 1 hr after cylic TPN stopped -CBG while off TPN Continue SSI insulin at moderate scale and add 8 units of insulin in TPN Check TPN labs on Mondays  Debra [name] 01/01/2023

## 2023-01-02 LAB — GLUCOSE, CAPILLARY
Glucose-Capillary: 185 mg/dL — ABNORMAL HIGH (ref 70–99)
Glucose-Capillary: 181 mg/dL — ABNORMAL HIGH (ref 70–99)
Glucose-Capillary: 114 mg/dL — ABNORMAL HIGH (ref 70–99)
Glucose-Capillary: 89 mg/dL (ref 70–99)

## 2023-01-02 MED ORDER — ZINC CHLORIDE 1 MG/ML IV SOLN
INTRAVENOUS | Status: AC
  Filled 2023-01-02: qty 672

## 2023-01-02 NOTE — Progress Notes (Signed)
[facility]   Hospital Day(s): 323.   Post op day(s): 14 Days Post-Op.   Interval History:  Patient seen and examined No issues overnight Does have an episode of emesis recorded in I/O ?  No abdominal pain, fever, chills No new labs this morning  No oozing from ostomy; port site clean  [name] output unmeasured in last 24 hours; managing effluent independently On cyclic TPN + soft diet  Ambulating well; independent  Review of Systems:  Constitutional: denies fever, chills  HEENT: denies cough or congestion  Respiratory: denies any shortness of breath  Cardiovascular: denies chest pain or palpitations  Gastrointestinal: denied abdominal pain, denied N/V Genitourinary: denies burning with urination or urinary frequency Integumentary: + midline wound (healing; stable)  Vital signs in last 24 hours: [min-max] current  Temp:  [98.2 F (36.8 C)-98.6 F (37 C)] 98.2 F (36.8 C) (04/26 0727) Pulse Rate:  [71-73] 71 (04/26 0727) Resp:  [17-18] 17 (04/26 0727) BP: (97-106)/(54-58) 97/56 (04/26 0727) SpO2:  [96 %-97 %] 97 % (04/26 0727) Weight:  [102.6 kg] 102.6 kg (04/26 0506)     Height: 4\' 11"  (149.9 cm) Weight: 102.6 kg BMI (Calculated): 45.66   Intake/Output last 2 shifts:  04/25 0701 - 04/26 0700 In: 1134.7 [P.O.:120; I.V.:1014.7] Out: 3 [Urine:3]   Physical Exam:  Constitutional: alert, cooperative and no distress  Respiratory: breathing non-labored at rest  Cardiovascular: regular rate and sinus rhythm  Gastrointestinal: Soft, non-tender, non-distended, no rebound/guarding. Integumentary: Midline wound healing by secondary intention, peritoneum closed; granulating and new skin growing over majority of wound now; there are three stomatized areas visible in the LUQ and LLQ portions of the wound. No active bleeding Chest: Port to the right chest; mild ecchymosis (healing); no evidence of hematoma or bleed; previous swelling  improved   Labs:     Latest Ref Rng & Units 12/31/2022    5:53 AM 12/30/2022   10:35 AM 12/29/2022    7:22 AM  CBC  WBC 4.0 - 10.5 K/uL 3.5  3.0  3.1   Hemoglobin 12.0 - 15.0 g/dL 7.9  6.6  6.8   Hematocrit 36.0 - 46.0 % 24.1  20.9  21.2   Platelets 150 - 400 K/uL 93  85  108       Latest Ref Rng & Units 12/29/2022    7:22 AM 12/27/2022    7:57 PM 12/22/2022    5:47 AM  CMP  Glucose 70 - 99 mg/dL 161  096  045   BUN 6 - 20 mg/dL 20  20  28    Creatinine 0.44 - 1.00 mg/dL 4.09  8.11  9.14   Sodium 135 - 145 mmol/L 132  133  131   Potassium 3.5 - 5.1 mmol/L 4.5  4.4  3.5   Chloride 98 - 111 mmol/L 103  106  102   CO2 22 - 32 mmol/L 25  23  23    Calcium 8.9 - 10.3 mg/dL 7.9  7.7  7.6   Total Protein 6.5 - 8.1 g/dL 5.6   5.2   Total Bilirubin 0.3 - 1.2 mg/dL 0.3   0.5   Alkaline Phos 38 - 126 U/L 103   86   AST 15 - 41 U/L 33   29   ALT 0 - 44 U/L 26   25     Imaging studies: No new imaging studies this AM   Assessment/Plan:  57 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  - New 04/26:  Nausea overnight, better this AM. Monitor. KUB as needed but no signs of obstruction/ileus otherwise   - Wound Care ([name] Pouch); Now on smaller [name] pouch; continues managing effluent independently. Intermittent oozing from time to time. Change as needed.    - Continue soft diet as tolerated  - Continue cyclic TPN; weekly nutritional labs - Appreciate dietary assistance - monitoring weight              - Monitor abdominal examination; on-going bowel function            - Pain control prn; antemetic prn            - Progressed with therapies; no longer any  recommendations; ambulating well    - Discharge Planning: Continue to anticipate lengthy admission +/- possible transfer; no new progress regarding disposition unfortunately   All of the above findings and recommendations were discussed with the patient, and the medical team, and all of patient's questions were answered to her expressed satisfaction.  -- [name], PA-C [facility] 01/02/2023, 7:43 AM M-F: 7am - 4pm

## 2023-01-02 NOTE — Progress Notes (Signed)
PHARMACY - TOTAL PARENTERAL NUTRITION CONSULT NOTE   Indication: Prolonged ileus  Patient Measurements: Height: 4\' 11"  (149.9 cm) Weight: 102.6 kg (226 lb 3.1 oz) IBW/kg (Calculated) : 43.2 TPN AdjBW (KG): 57.8 Body mass index is 45.69 kg/m.  Assessment: Debra [name] is a 57 y.o. female s/p laparotomy, excision of greater omental mass, abdominal wall reconstruction with [name], appendectomy, and placement of Prevena vac. New DVT and PICC was replaced 4/12.   Glucose / Insulin:  --No apparent history of diabetes --BG controlled on current regimen.  --rSSI 4x/day (0500, 1300, 1800, 2200) --SSI last 24h: 5 units  --BG/24h: 117 - 185  --8 units insulin in TPN Electrolytes: Na low at baseline, stable: increased slightly in TPN 4/22 Renal: SCr stable at baseline  Hepatic: Liver function within normal limits GI Imaging: 9/11 CTAP: no new acute issues GI Surgeries / Procedures: s/p laparotomy, excision of greater omental mass, abdominal wall reconstruction with [name], appendectomy, and placement of Prevena vac 12/19/22 placement of right IJ port-a-catheter with dual reservoirs also s/p re-opening of laparotomy for repair of small bowel perforation   Central access: 02/15/22 TPN start date: 02/15/22  Nutritional Goals: Goal cyclic TPN over 16 hrs: cyclic TPN over 16 hrs: (provides 102 g of protein and 1811 kcals per day) total volume over 16hrs for calculations=1400 ml (1500 ml total with overfill)  RD Assessment:  Estimated Needs Total Energy Estimated Needs: 1800-2100kcal/day Total Protein Estimated Needs: 90-110g/d Total Fluid Estimated Needs: 1.4-1.6L/day  Current Nutrition:  Soft diet + nutritional supplements, not meeting PO needs yet   Plan: Transitioned to *Cyclic* TPN on 10/07/84.   to run over 16 hours per MD request. [name] at 2000 per discussion with dietician to allow patient time for walking in afternoon/evenings.  To run over 16  hours: -Start rate at 49 mL/hr for 1 hour. -Increase rate to 98 mL/hr for 13 hours.  -Decrease rate to 49 mL/hr for 1 hour. -Decrease rate to 25 mL/hr for 1 hour, then stop.  Plan:  See cyclic TPN rate above Nutritional Components Amino acids (using 15% Clinisol): 101 g Dextrose 19% = 266 g Lipids (using 20% SMOFlipids): 50.4 g kCal: 1817/24h  Electrolytes in TPN: Na 75 mEq/L, K 3mEq/L, Ca 22mEq/L, Mg 10 mEq/L, Phos 10 mmol/L  Continue ratio Cl:Ac 1:1  Continue trace elements, MVI, chromium, zinc and selenium per dietary recs  Continue CBG/SSI for Cyclic TPN:  -CBG 2 hrs after cyclic TPN start -CBG during middle of cyclic TPN infusion -CBG 1 hr after cylic TPN stopped -CBG while off TPN Continue SSI insulin at moderate scale and add 8 units of insulin in TPN Check TPN labs on Mondays  [name] 01/02/2023

## 2023-01-03 MED ORDER — ZINC CHLORIDE 1 MG/ML IV SOLN
INTRAVENOUS | Status: AC
  Filled 2023-01-03: qty 672

## 2023-01-03 NOTE — Progress Notes (Signed)
Patient ID: Debra [name], female   DOB: 1966/07/15, 57 y.o.   MRN: [id number]     SURGICAL PROGRESS NOTE   Hospital Day(s): 324.   Interval History: Patient seen and examined, no acute events or new complaints overnight. Patient reports feeling okay.  She denies any new complaint today.  She denies any issues overnight.  She endorses tolerating diet.  Vital signs in last 24 hours: [min-max] current  Temp:  [97.5 F (36.4 C)-98.4 F (36.9 C)] 98.3 F (36.8 C) (04/27 0834) Pulse Rate:  [63-76] 63 (04/27 0834) Resp:  [16-18] 18 (04/27 0504) BP: (98-123)/(53-72) 98/54 (04/27 0834) SpO2:  [97 %-100 %] 98 % (04/27 0834) Weight:  [102.6 kg] 102.6 kg (04/27 0500)     Height: 4\' 11"  (149.9 cm) Weight: 102.6 kg BMI (Calculated): 45.66   Physical Exam:  Constitutional: alert, cooperative and no distress  Respiratory: breathing non-labored at rest  Cardiovascular: regular rate and sinus rhythm  Gastrointestinal: soft, non-tender, and non-distended.  Abdominal wound with adequate granulation tissue.  With known fistulous opening.  Labs:     Latest Ref Rng & Units 12/31/2022    5:53 AM 12/30/2022   10:35 AM 12/29/2022    7:22 AM  CBC  WBC 4.0 - 10.5 K/uL 3.5  3.0  3.1   Hemoglobin 12.0 - 15.0 g/dL 7.9  6.6  6.8   Hematocrit 36.0 - 46.0 % 24.1  20.9  21.2   Platelets 150 - 400 K/uL 93  85  108       Latest Ref Rng & Units 12/29/2022    7:22 AM 12/27/2022    7:57 PM 12/22/2022    5:47 AM  CMP  Glucose 70 - 99 mg/dL 409  811  914   BUN 6 - 20 mg/dL 20  20  28    Creatinine 0.44 - 1.00 mg/dL 7.82  9.56  2.13   Sodium 135 - 145 mmol/L 132  133  131   Potassium 3.5 - 5.1 mmol/L 4.5  4.4  3.5   Chloride 98 - 111 mmol/L 103  106  102   CO2 22 - 32 mmol/L 25  23  23    Calcium 8.9 - 10.3 mg/dL 7.9  7.7  7.6   Total Protein 6.5 - 8.1 g/dL 5.6   5.2   Total Bilirubin 0.3 - 1.2 mg/dL 0.3   0.5   Alkaline Phos 38 - 126 U/L 103   86   AST 15 - 41 U/L 33   29   ALT 0 - 44 U/L 26   25      Imaging studies: No new pertinent imaging studies   Assessment/Plan:  57 y.o. female with metastatic colon cancer s/p omentectomy and incisional hernia repair 324 Days Post-Op, complicated by pertinent comorbidities including enterocutaneous fistula.   Patient continue with stable vital signs.  No fever. -Clinically no significant deterioration. -No new complaints today. -Will continue with current diet.  Continue pain control.  Continue wound management.  Gae Gallop, MD

## 2023-01-03 NOTE — Progress Notes (Signed)
Appliance changed without complications.  Debra [name]

## 2023-01-03 NOTE — Progress Notes (Signed)
PHARMACY - TOTAL PARENTERAL NUTRITION CONSULT NOTE   Indication: Prolonged ileus  Patient Measurements: Height: 4\' 11"  (149.9 cm) Weight: 102.6 kg (226 lb 3.1 oz) IBW/kg (Calculated) : 43.2 TPN AdjBW (KG): 57.8 Body mass index is 45.69 kg/m.  Assessment: Debra [name] is a 57 y.o. female s/p laparotomy, excision of greater omental mass, abdominal wall reconstruction with [name], appendectomy, and placement of Prevena vac. New DVT and PICC was replaced 4/12.   Glucose / Insulin:  --No apparent history of diabetes --BG controlled on current regimen.  --rSSI 4x/day (0500, 1300, 1800, 2200) --SSI last 24h: 5 units  --BG/24h: 117 - 185  --8 units insulin in TPN Electrolytes: Na low at baseline, stable: increased slightly in TPN 4/22 Renal: SCr stable at baseline  Hepatic: Liver function within normal limits GI Imaging: 9/11 CTAP: no new acute issues GI Surgeries / Procedures: s/p laparotomy, excision of greater omental mass, abdominal wall reconstruction with [name], appendectomy, and placement of Prevena vac 12/19/22 placement of right IJ port-a-catheter with dual reservoirs also s/p re-opening of laparotomy for repair of small bowel perforation   Central access: 02/15/22 TPN start date: 02/15/22  Nutritional Goals: Goal cyclic TPN over 16 hrs: cyclic TPN over 16 hrs: (provides 102 g of protein and 1811 kcals per day) total volume over 16hrs for calculations=1400 ml (1500 ml total with overfill)  RD Assessment:  Estimated Needs Total Energy Estimated Needs: 1800-2100kcal/day Total Protein Estimated Needs: 90-110g/d Total Fluid Estimated Needs: 1.4-1.6L/day  Current Nutrition:  Soft diet + nutritional supplements, not meeting PO needs yet   Plan: Transitioned to *Cyclic* TPN on 1/61/09.   to run over 16 hours per MD request. [name] at 2000 per discussion with dietician to allow patient time for walking in afternoon/evenings.  To run over 16  hours: -Start rate at 49 mL/hr for 1 hour. -Increase rate to 98 mL/hr for 13 hours.  -Decrease rate to 49 mL/hr for 1 hour. -Decrease rate to 25 mL/hr for 1 hour, then stop.  Plan:  See cyclic TPN rate above Nutritional Components Amino acids (using 15% Clinisol): 101 g Dextrose 19% = 266 g Lipids (using 20% SMOFlipids): 50.4 g kCal: 1817/24h  Electrolytes in TPN: Na 75 mEq/L, K 12mEq/L, Ca 57mEq/L, Mg 10 mEq/L, Phos 10 mmol/L  Continue ratio Cl:Ac 1:1  Continue trace elements, MVI, chromium, zinc and selenium per dietary recs  Continue CBG/SSI for Cyclic TPN:  -CBG 2 hrs after cyclic TPN start -CBG during middle of cyclic TPN infusion -CBG 1 hr after cylic TPN stopped -CBG while off TPN Continue SSI insulin at moderate scale and add 8 units of insulin in TPN Check TPN labs on Mondays  [facility] A Nadyne Gariepy 01/03/2023

## 2023-01-04 LAB — GLUCOSE, CAPILLARY
Glucose-Capillary: 138 mg/dL — ABNORMAL HIGH (ref 70–99)
Glucose-Capillary: 146 mg/dL — ABNORMAL HIGH (ref 70–99)
Glucose-Capillary: 99 mg/dL (ref 70–99)

## 2023-01-04 MED ORDER — ZINC CHLORIDE 1 MG/ML IV SOLN
INTRAVENOUS | Status: AC
  Filled 2023-01-04: qty 672

## 2023-01-04 NOTE — Progress Notes (Signed)
Pt has a dual portacath , placed 12-19-22;  port was due for routine re-access today;  when the needle was removed from the medial port, a steady stream of light tan/light yellow drainage ran down the pt's shoulder and neck; drainage then turned more sero-sanguinous, especially when light pressure was placed over the medial port;  the area above the port appears slightly elevated, slightly tender; pt also pointed near the right side of her neck and jaw, and told the RN "little sore;" RN notified MD, who came to the pt's room to assess;  was asked to leave the port de-accessed , and re-assess tomorrow;  TPN to be held tonight; a sterile, clear dressing was placed over the port, with a small 2x2 folded over the medial port where the serosanguinous drainage was still noted .

## 2023-01-04 NOTE — Progress Notes (Signed)
PHARMACY - TOTAL PARENTERAL NUTRITION CONSULT NOTE   Indication: Prolonged ileus  Patient Measurements: Height: 4\' 11"  (149.9 cm) Weight: 102.6 kg (226 lb 3.1 oz) IBW/kg (Calculated) : 43.2 TPN AdjBW (KG): 57.8 Body mass index is 45.69 kg/m.  Assessment: Debra [name] is a 57 y.o. female s/p laparotomy, excision of greater omental mass, abdominal wall reconstruction with [name], appendectomy, and placement of Prevena vac. New DVT and PICC was replaced 4/12.   Glucose / Insulin:  --No apparent history of diabetes --BG controlled on current regimen.  --rSSI 4x/day (0500, 1300, 1800, 2200) --SSI last 24h: 5 units  --BG/24h: 117 - 185  --8 units insulin in TPN Electrolytes: Na low at baseline, stable: increased slightly in TPN 4/22 Renal: SCr stable at baseline  Hepatic: Liver function within normal limits GI Imaging: 9/11 CTAP: no new acute issues GI Surgeries / Procedures: s/p laparotomy, excision of greater omental mass, abdominal wall reconstruction with [name], appendectomy, and placement of Prevena vac 12/19/22 placement of right IJ port-a-catheter with dual reservoirs also s/p re-opening of laparotomy for repair of small bowel perforation   Central access: 02/15/22 TPN start date: 02/15/22  Nutritional Goals: Goal cyclic TPN over 16 hrs: cyclic TPN over 16 hrs: (provides 102 g of protein and 1811 kcals per day) total volume over 16hrs for calculations=1400 ml (1500 ml total with overfill)  RD Assessment:  Estimated Needs Total Energy Estimated Needs: 1800-2100kcal/day Total Protein Estimated Needs: 90-110g/d Total Fluid Estimated Needs: 1.4-1.6L/day  Current Nutrition:  Soft diet + nutritional supplements, not meeting PO needs yet   Plan: Transitioned to *Cyclic* TPN on 7/82/95.   to run over 16 hours per MD request. [name] at 2000 per discussion with dietician to allow patient time for walking in afternoon/evenings.  To run over 16  hours: -Start rate at 49 mL/hr for 1 hour. -Increase rate to 98 mL/hr for 13 hours.  -Decrease rate to 49 mL/hr for 1 hour. -Decrease rate to 25 mL/hr for 1 hour, then stop.  Plan:  See cyclic TPN rate above Nutritional Components Amino acids (using 15% Clinisol): 101 g Dextrose 19% = 266 g Lipids (using 20% SMOFlipids): 50.4 g kCal: 1817/24h  Electrolytes in TPN: Na 75 mEq/L, K 35mEq/L, Ca 23mEq/L, Mg 10 mEq/L, Phos 10 mmol/L  Continue ratio Cl:Ac 1:1  Continue trace elements, MVI, chromium, zinc and selenium per dietary recs  Continue CBG/SSI for Cyclic TPN:  -CBG 2 hrs after cyclic TPN start -CBG during middle of cyclic TPN infusion -CBG 1 hr after cylic TPN stopped -CBG while off TPN Continue SSI insulin at moderate scale and add 8 units of insulin in TPN Check TPN labs on Mondays  [facility] A Jamy Whyte 01/04/2023

## 2023-01-04 NOTE — Progress Notes (Signed)
Patient ID: Debra [name], female   DOB: 1966/03/02, 57 y.o.   MRN: [id number]     SURGICAL PROGRESS NOTE   Hospital Day(s): 325.   Interval History: Patient seen and examined, no acute events or new complaints overnight. Patient reports feeling well this morning.  She endorses that the neck pain is improving.  Denies any complaint today.  She is tolerating diet.  No nausea or vomiting.  Vital signs in last 24 hours: [min-max] current  Temp:  [97.9 F (36.6 C)-99.3 F (37.4 C)] 98.3 F (36.8 C) (04/28 0725) Pulse Rate:  [73-88] 79 (04/28 0725) Resp:  [16-20] 20 (04/28 0725) BP: (101-114)/(55-71) 103/62 (04/28 0725) SpO2:  [97 %-100 %] 98 % (04/28 0725) Weight:  [102.6 kg] 102.6 kg (04/28 0500)     Height: 4\' 11"  (149.9 cm) Weight: 102.6 kg BMI (Calculated): 45.66   Physical Exam:  Constitutional: alert, cooperative and no distress  Respiratory: breathing non-labored at rest  Cardiovascular: regular rate and sinus rhythm  Gastrointestinal: soft, non-tender, and non-distended fistula opening osteomized.  Labs:     Latest Ref Rng & Units 12/31/2022    5:53 AM 12/30/2022   10:35 AM 12/29/2022    7:22 AM  CBC  WBC 4.0 - 10.5 K/uL 3.5  3.0  3.1   Hemoglobin 12.0 - 15.0 g/dL 7.9  6.6  6.8   Hematocrit 36.0 - 46.0 % 24.1  20.9  21.2   Platelets 150 - 400 K/uL 93  85  108       Latest Ref Rng & Units 12/29/2022    7:22 AM 12/27/2022    7:57 PM 12/22/2022    5:47 AM  CMP  Glucose 70 - 99 mg/dL 409  811  914   BUN 6 - 20 mg/dL 20  20  28    Creatinine 0.44 - 1.00 mg/dL 7.82  9.56  2.13   Sodium 135 - 145 mmol/L 132  133  131   Potassium 3.5 - 5.1 mmol/L 4.5  4.4  3.5   Chloride 98 - 111 mmol/L 103  106  102   CO2 22 - 32 mmol/L 25  23  23    Calcium 8.9 - 10.3 mg/dL 7.9  7.7  7.6   Total Protein 6.5 - 8.1 g/dL 5.6   5.2   Total Bilirubin 0.3 - 1.2 mg/dL 0.3   0.5   Alkaline Phos 38 - 126 U/L 103   86   AST 15 - 41 U/L 33   29   ALT 0 - 44 U/L 26   25     Imaging studies: No  new pertinent imaging studies   Assessment/Plan:  57 y.o. female with metastatic colon cancer s/p omentectomy and incisional hernia repair 325 Days Post-Op, complicated by pertinent comorbidities including enterocutaneous fistula.   -She continued feeling better.  Neck pain improving. -Tolerating diet -No clinical deterioration -Will continue same management  Gae Gallop, MD

## 2023-01-05 LAB — GLUCOSE, CAPILLARY
Glucose-Capillary: 102 mg/dL — ABNORMAL HIGH (ref 70–99)
Glucose-Capillary: 111 mg/dL — ABNORMAL HIGH (ref 70–99)
Glucose-Capillary: 115 mg/dL — ABNORMAL HIGH (ref 70–99)
Glucose-Capillary: 141 mg/dL — ABNORMAL HIGH (ref 70–99)
Glucose-Capillary: 144 mg/dL — ABNORMAL HIGH (ref 70–99)
Glucose-Capillary: 145 mg/dL — ABNORMAL HIGH (ref 70–99)
Glucose-Capillary: 149 mg/dL — ABNORMAL HIGH (ref 70–99)
Glucose-Capillary: 168 mg/dL — ABNORMAL HIGH (ref 70–99)
Glucose-Capillary: 56 mg/dL — ABNORMAL LOW (ref 70–99)
Glucose-Capillary: 62 mg/dL — ABNORMAL LOW (ref 70–99)
Glucose-Capillary: 91 mg/dL (ref 70–99)

## 2023-01-05 LAB — CBC
HCT: 24.9 % — ABNORMAL LOW (ref 36.0–46.0)
Hemoglobin: 7.9 g/dL — ABNORMAL LOW (ref 12.0–15.0)
MCH: 27.6 pg (ref 26.0–34.0)
MCHC: 31.7 g/dL (ref 30.0–36.0)
MCV: 87.1 fL (ref 80.0–100.0)
Platelets: 83 10*3/uL — ABNORMAL LOW (ref 150–400)
RBC: 2.86 MIL/uL — ABNORMAL LOW (ref 3.87–5.11)
RDW: 15.2 % (ref 11.5–15.5)
WBC: 3.4 10*3/uL — ABNORMAL LOW (ref 4.0–10.5)
nRBC: 0 % (ref 0.0–0.2)

## 2023-01-05 LAB — COMPREHENSIVE METABOLIC PANEL
ALT: 42 U/L (ref 0–44)
AST: 68 U/L — ABNORMAL HIGH (ref 15–41)
Albumin: 2.6 g/dL — ABNORMAL LOW (ref 3.5–5.0)
Alkaline Phosphatase: 140 U/L — ABNORMAL HIGH (ref 38–126)
Anion gap: 5 (ref 5–15)
BUN: 19 mg/dL (ref 6–20)
CO2: 23 mmol/L (ref 22–32)
Calcium: 8.2 mg/dL — ABNORMAL LOW (ref 8.9–10.3)
Chloride: 108 mmol/L (ref 98–111)
Creatinine, Ser: 0.53 mg/dL (ref 0.44–1.00)
GFR, Estimated: >60 mL/min (ref >60)
Glucose, Bld: 104 mg/dL — ABNORMAL HIGH (ref 70–99)
Potassium: 3.9 mmol/L (ref 3.5–5.1)
Sodium: 136 mmol/L (ref 135–145)
Total Bilirubin: 0.7 mg/dL (ref 0.3–1.2)
Total Protein: 6.1 g/dL — ABNORMAL LOW (ref 6.5–8.1)

## 2023-01-05 LAB — PHOSPHORUS: Phosphorus: 4.1 mg/dL (ref 2.5–4.6)

## 2023-01-05 LAB — MAGNESIUM: Magnesium: 2 mg/dL (ref 1.7–2.4)

## 2023-01-05 MED ORDER — ZINC CHLORIDE 1 MG/ML IV SOLN
INTRAVENOUS | Status: AC
  Filled 2023-01-05: qty 672

## 2023-01-05 NOTE — Progress Notes (Signed)
PHARMACY - TOTAL PARENTERAL NUTRITION CONSULT NOTE   Indication: Prolonged ileus  Patient Measurements: Height: 4\' 11"  (149.9 cm) Weight: 101.1 kg (222 lb 14.2 oz) IBW/kg (Calculated) : 43.2 TPN AdjBW (KG): 57.8 Body mass index is 45.02 kg/m.  Assessment: Debra [name] is a 56 y.o. female s/p laparotomy, excision of greater omental mass, abdominal wall reconstruction with [name], appendectomy, and placement of Prevena vac. New DVT and PICC was replaced 4/12.   Glucose / Insulin:  --No apparent history of diabetes --BG controlled on current regimen.  --rSSI 4x/day (0500, 1300, 1800, 2200) --SSI last 24h: 2 units  --BG/24h: 91 - 146 --8 units insulin in TPN Electrolytes: Na low at baseline, stable: increased slightly in TPN 4/22 Renal: SCr stable at baseline  Hepatic: AST elevated at 68 GI Imaging: 9/11 CTAP: no new acute issues GI Surgeries / Procedures: s/p laparotomy, excision of greater omental mass, abdominal wall reconstruction with [name], appendectomy, and placement of Prevena vac 12/19/22 placement of right IJ port-a-catheter with dual reservoirs also s/p re-opening of laparotomy for repair of small bowel perforation   Central access: 02/15/22 TPN start date: 02/15/22  Nutritional Goals: Goal cyclic TPN over 16 hrs: cyclic TPN over 16 hrs: (provides 102 g of protein and 1811 kcals per day) total volume over 16hrs for calculations=1400 ml (1500 ml total with overfill)  RD Assessment:  Estimated Needs Total Energy Estimated Needs: 1800-2100kcal/day Total Protein Estimated Needs: 90-110g/d Total Fluid Estimated Needs: 1.4-1.6L/day  Current Nutrition:  Soft diet + nutritional supplements, not meeting PO needs yet   Plan: Transitioned to *Cyclic* TPN on 1/61/09.   to run over 16 hours per MD request. [name] at 2000 per discussion with dietician to allow patient time for walking in afternoon/evenings.  To run over 16 hours: -Start rate  at 49 mL/hr for 1 hour. -Increase rate to 98 mL/hr for 13 hours.  -Decrease rate to 49 mL/hr for 1 hour. -Decrease rate to 25 mL/hr for 1 hour, then stop.  Plan:  See cyclic TPN rate above Nutritional Components Amino acids (using 15% Clinisol): 101 g Dextrose 19% = 266 g Lipids (using 20% SMOFlipids): 50.4 g kCal: 1817/24h  Electrolytes in TPN: Na 75 mEq/L, K 3mEq/L, Ca 87mEq/L, Mg 10 mEq/L, Phos 10 mmol/L  Continue ratio Cl:Ac 1:1  Continue trace elements, MVI, chromium, zinc and selenium per dietary recs  Continue CBG/SSI for Cyclic TPN:  -CBG 2 hrs after cyclic TPN start -CBG during middle of cyclic TPN infusion -CBG 1 hr after cylic TPN stopped -CBG while off TPN Continue SSI insulin at moderate scale and add 8 units of insulin in TPN Check TPN labs on Mondays Recheck CMP 01/06/23  Barrie Folk, PharmD 01/05/2023

## 2023-01-05 NOTE — Progress Notes (Signed)
[facility] SURGICAL PROGRESS NOTE   Hospital Day(s): 326.   Post op day(s): 17 Days Post-Op.   Interval History:  Patient seen and examined Issues with port noted over the weekend No further drainage from port site; no swelling this AM No abdominal pain, fever, chills Weekly labs are pending  No oozing from ostomy; port site clean  [name] output unmeasured in last 24 hours; managing effluent independently On cyclic TPN + soft diet  Ambulating well; independent  Review of Systems:  Constitutional: denies fever, chills  HEENT: denies cough or congestion  Respiratory: denies any shortness of breath  Cardiovascular: denies chest pain or palpitations  Gastrointestinal: denied abdominal pain, denied N/V Genitourinary: denies burning with urination or urinary frequency Integumentary: + midline wound (healing; stable)  Vital signs in last 24 hours: [min-max] current  Temp:  [98.1 F (36.7 C)-98.3 F (36.8 C)] 98.3 F (36.8 C) (04/29 0441) Pulse Rate:  [71-78] 73 (04/29 0441) Resp:  [17-20] 18 (04/29 0441) BP: (92-100)/(53-58) 92/53 (04/29 0441) SpO2:  [98 %-100 %] 98 % (04/29 0441) Weight:  [101.1 kg] 101.1 kg (04/29 0500)     Height: 4\' 11"  (149.9 cm) Weight: 101.1 kg BMI (Calculated): 44.99   Intake/Output last 2 shifts:  04/28 0701 - 04/29 0700 In: 1260 [P.O.:900; I.V.:10] Out: -    Physical Exam:  Constitutional: alert, cooperative and no distress  Respiratory: breathing non-labored at rest  Cardiovascular: regular rate and sinus rhythm  Gastrointestinal: Soft, non-tender, non-distended, no rebound/guarding. Integumentary: Midline wound healing by secondary intention, peritoneum closed; granulating and new skin growing over majority of wound now; there are three stomatized areas visible in the LUQ and LLQ portions of the wound. No active bleeding Chest: Port to the right chest; previous drainage and swelling has subsided; discussion with RN and evaluation  of pictures seems likely there was seroma; no evidence of infection  Port - Right Chest (01/05/2023):    Labs:     Latest Ref Rng & Units 12/31/2022    5:53 AM 12/30/2022   10:35 AM 12/29/2022    7:22 AM  CBC  WBC 4.0 - 10.5 K/uL 3.5  3.0  3.1   Hemoglobin 12.0 - 15.0 g/dL 7.9  6.6  6.8   Hematocrit 36.0 - 46.0 % 24.1  20.9  21.2   Platelets 150 - 400 K/uL 93  85  108       Latest Ref Rng & Units 12/29/2022    7:22 AM 12/27/2022    7:57 PM 12/22/2022    5:47 AM  CMP  Glucose 70 - 99 mg/dL 629  528  413   BUN 6 - 20 mg/dL 20  20  28    Creatinine 0.44 - 1.00 mg/dL 2.44  0.10  2.72   Sodium 135 - 145 mmol/L 132  133  131   Potassium 3.5 - 5.1 mmol/L 4.5  4.4  3.5   Chloride 98 - 111 mmol/L 103  106  102   CO2 22 - 32 mmol/L 25  23  23    Calcium 8.9 - 10.3 mg/dL 7.9  7.7  7.6   Total Protein 6.5 - 8.1 g/dL 5.6   5.2   Total Bilirubin 0.3 - 1.2 mg/dL 0.3   0.5   Alkaline Phos 38 - 126 U/L 103   86   AST 15 - 41 U/L 33   29   ALT 0 - 44 U/L 26   25     Imaging studies: No  new imaging studies this AM   Assessment/Plan:  57 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  - New 04/29: Okay to access port; site looks good this AM; Suspect this was seroma that drained over the weekend; monitor when restarting access. Follow up nutritional labs    - Wound Care ([name] Pouch); Now on smaller [name] pouch; continues managing effluent independently. Intermittent oozing from time to time. Change as needed.    - Continue soft diet as tolerated  - Continue cyclic TPN; weekly nutritional labs - Appreciate dietary assistance - monitoring weight              - Monitor  abdominal examination; on-going bowel function            - Pain control prn; antemetic prn            - Progressed with therapies; no longer any recommendations; ambulating well    - Discharge Planning: Continue to anticipate lengthy admission +/- possible transfer; no new progress regarding disposition unfortunately   All of the above findings and recommendations were discussed with the patient, and the medical team, and all of patient's questions were answered to her expressed satisfaction.  -- [name], PA-C [facility] 01/05/2023, 7:31 AM M-F: 7am - 4pm

## 2023-01-06 LAB — COMPREHENSIVE METABOLIC PANEL
ALT: 42 U/L (ref 0–44)
AST: 59 U/L — ABNORMAL HIGH (ref 15–41)
Albumin: 2.5 g/dL — ABNORMAL LOW (ref 3.5–5.0)
Alkaline Phosphatase: 146 U/L — ABNORMAL HIGH (ref 38–126)
Anion gap: 7 (ref 5–15)
BUN: 10 mg/dL (ref 6–20)
CO2: 23 mmol/L (ref 22–32)
Calcium: 8.2 mg/dL — ABNORMAL LOW (ref 8.9–10.3)
Chloride: 103 mmol/L (ref 98–111)
Creatinine, Ser: 0.5 mg/dL (ref 0.44–1.00)
GFR, Estimated: >60 mL/min (ref >60)
Glucose, Bld: 130 mg/dL — ABNORMAL HIGH (ref 70–99)
Potassium: 3.8 mmol/L (ref 3.5–5.1)
Sodium: 133 mmol/L — ABNORMAL LOW (ref 135–145)
Total Bilirubin: 1.1 mg/dL (ref 0.3–1.2)
Total Protein: 6.2 g/dL — ABNORMAL LOW (ref 6.5–8.1)

## 2023-01-06 LAB — GLUCOSE, CAPILLARY
Glucose-Capillary: 107 mg/dL — ABNORMAL HIGH (ref 70–99)
Glucose-Capillary: 117 mg/dL — ABNORMAL HIGH (ref 70–99)
Glucose-Capillary: 120 mg/dL — ABNORMAL HIGH (ref 70–99)
Glucose-Capillary: 166 mg/dL — ABNORMAL HIGH (ref 70–99)
Glucose-Capillary: 69 mg/dL — ABNORMAL LOW (ref 70–99)

## 2023-01-06 MED ORDER — CHLORHEXIDINE GLUCONATE CLOTH 2 % EX PADS: 6.0000 | MEDICATED_PAD | Freq: Every day | CUTANEOUS | Status: DC

## 2023-01-06 MED ORDER — CHLORHEXIDINE GLUCONATE CLOTH 2 % EX PADS
6.0000 | MEDICATED_PAD | Freq: Every day | CUTANEOUS | Status: DC
  Administered 2023-01-07 – 2023-05-17 (×131): 6 via TOPICAL

## 2023-01-06 MED ORDER — ZINC CHLORIDE 1 MG/ML IV SOLN
INTRAVENOUS | Status: AC
  Filled 2023-01-06: qty 672

## 2023-01-06 NOTE — Progress Notes (Signed)
Rt double PAC accessed on the Medial side per progress note from Debra Haggard PA

## 2023-01-06 NOTE — Inpatient Diabetes Management (Signed)
Inpatient Diabetes Program Recommendations  AACE/ADA: New Consensus Statement on Inpatient Glycemic Control  Target Ranges:  Prepandial:   less than 140 mg/dL      Peak postprandial:   less than 180 mg/dL (1-2 hours)      Critically ill patients:  140 - 180 mg/dL    Latest Reference Range & Units 01/05/23 04:44 01/05/23 14:19 01/05/23 17:43 01/05/23 21:35 01/05/23 23:37 01/06/23 00:07  Glucose-Capillary 70 - 99 mg/dL 91 161 (H) 096 (H) 56 (L) 62 (L) 69 (L)   Review of Glycemic Control  Current orders for Inpatient glycemic control: TPN 25-98 ml/hr for 16 hours with 8 units of insulin, Novolog 0-15 units QID  Inpatient Diabetes Program Recommendations:    Insulin: Please discontinue insulin in TPN and consider decreasing Novolog correction to 0-9 units QID.  Thanks, Orlando Penner, RN, MSN, CDCES Diabetes Coordinator Inpatient Diabetes Program [phone] (Team Pager from 8am to 5pm)

## 2023-01-06 NOTE — Progress Notes (Signed)
[facility] SURGICAL PROGRESS NOTE   Hospital Day(s): 327.   Post op day(s): 18 Days Post-Op.   Interval History:  Patient seen and examined Nothing acute over night No abdominal pain, fever, chills No labs this morning  No oozing from ostomy; port site clean  [name] output unmeasured in last 24 hours; managing effluent independently On cyclic TPN + soft diet  Ambulating well; independent  Review of Systems:  Constitutional: denies fever, chills  HEENT: denies cough or congestion  Respiratory: denies any shortness of breath  Cardiovascular: denies chest pain or palpitations  Gastrointestinal: denied abdominal pain, denied N/V Genitourinary: denies burning with urination or urinary frequency Integumentary: + midline wound (healing; stable)  Vital signs in last 24 hours: [min-max] current  Temp:  [97.9 F (36.6 C)-98.3 F (36.8 C)] 98.1 F (36.7 C) (04/30 0407) Pulse Rate:  [65-70] 70 (04/30 0407) Resp:  [18] 18 (04/30 0407) BP: (103-121)/(50-63) 103/50 (04/30 0407) SpO2:  [98 %-100 %] 98 % (04/30 0407) Weight:  [100.5 kg] 100.5 kg (04/30 0256)     Height: 4\' 11"  (149.9 cm) Weight: 100.5 kg BMI (Calculated): 44.73   Intake/Output last 2 shifts:  04/29 0701 - 04/30 0700 In: 120 [P.O.:120] Out: -    Physical Exam:  Constitutional: alert, cooperative and no distress  Respiratory: breathing non-labored at rest  Cardiovascular: regular rate and sinus rhythm  Gastrointestinal: Soft, non-tender, non-distended, no rebound/guarding. Integumentary: Midline wound healing by secondary intention, peritoneum closed; granulating and new skin growing over majority of wound now; there are three stomatized areas visible in the LUQ and LLQ portions of the wound. No active bleeding Chest: Port to the right chest; no further swelling or drainage; no evidence of infection   Labs:     Latest Ref Rng & Units 01/05/2023    7:32 AM 12/31/2022    5:53 AM 12/30/2022   10:35 AM   CBC  WBC 4.0 - 10.5 K/uL 3.4  3.5  3.0   Hemoglobin 12.0 - 15.0 g/dL 7.9  7.9  6.6   Hematocrit 36.0 - 46.0 % 24.9  24.1  20.9   Platelets 150 - 400 K/uL 83  93  85       Latest Ref Rng & Units 01/05/2023    7:32 AM 12/29/2022    7:22 AM 12/27/2022    7:57 PM  CMP  Glucose 70 - 99 mg/dL 161  096  045   BUN 6 - 20 mg/dL 19  20  20    Creatinine 0.44 - 1.00 mg/dL 4.09  8.11  9.14   Sodium 135 - 145 mmol/L 136  132  133   Potassium 3.5 - 5.1 mmol/L 3.9  4.5  4.4   Chloride 98 - 111 mmol/L 108  103  106   CO2 22 - 32 mmol/L 23  25  23    Calcium 8.9 - 10.3 mg/dL 8.2  7.9  7.7   Total Protein 6.5 - 8.1 g/dL 6.1  5.6    Total Bilirubin 0.3 - 1.2 mg/dL 0.7  0.3    Alkaline Phos 38 - 126 U/L 140  103    AST 15 - 41 U/L 68  33    ALT 0 - 44 U/L 42  26      Imaging studies: No new imaging studies this AM   Assessment/Plan:  57 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  - New 04/30: Okay to access port; MEDIAL SIDE ONLY. Nothing acute otherwise   - Wound Care ([name] Pouch); Now on smaller [name] pouch; continues managing effluent independently. Intermittent oozing from time to time. Change as needed.    - Continue soft diet as tolerated  - Continue cyclic TPN; weekly nutritional labs - Appreciate dietary assistance - monitoring weight              - Monitor abdominal examination; on-going bowel function            - Pain control prn; antemetic prn            - Progressed with therapies; no longer any recommendations; ambulating well    - Discharge Planning: Continue to anticipate lengthy admission +/- possible transfer; no new progress regarding disposition  unfortunately   All of the above findings and recommendations were discussed with the patient, and the medical team, and all of patient's questions were answered to her expressed satisfaction.  -- [name], PA-C [facility] 01/06/2023, 8:28 AM M-F: 7am - 4pm

## 2023-01-06 NOTE — Progress Notes (Signed)
PHARMACY - TOTAL PARENTERAL NUTRITION CONSULT NOTE   Indication: Prolonged ileus  Patient Measurements: Height: 4\' 11"  (149.9 cm) Weight: 100.5 kg (221 lb 9 oz) IBW/kg (Calculated) : 43.2 TPN AdjBW (KG): 57.8 Body mass index is 44.75 kg/m.  Assessment: Debra [name] is a 56 y.o. female s/p laparotomy, excision of greater omental mass, abdominal wall reconstruction with [name], appendectomy, and placement of Prevena vac. New DVT and PICC was replaced 4/12.   Glucose / Insulin:  --No apparent history of diabetes --BG controlled on current regimen.  --rSSI 4x/day (0500, 1300, 1800, 2200) --SSI last 24h:0 units  --BG/24h: 56 - 111 --8 units insulin in TPN (will remove 4/30) Electrolytes: Na low at baseline, stable: increased slightly in TPN 4/22 Renal: SCr stable at baseline  Hepatic: AST elevated at 59 (down from 68 yesterday) GI Imaging: 9/11 CTAP: no new acute issues GI Surgeries / Procedures: s/p laparotomy, excision of greater omental mass, abdominal wall reconstruction with [name], appendectomy, and placement of Prevena vac 12/19/22 placement of right IJ port-a-catheter with dual reservoirs also s/p re-opening of laparotomy for repair of small bowel perforation   Central access: 02/15/22 TPN start date: 02/15/22  Nutritional Goals: Goal cyclic TPN over 16 hrs: cyclic TPN over 16 hrs: (provides 102 g of protein and 1811 kcals per day) total volume over 16hrs for calculations=1400 ml (1500 ml total with overfill)  RD Assessment:  Estimated Needs Total Energy Estimated Needs: 1800-2100kcal/day Total Protein Estimated Needs: 90-110g/d Total Fluid Estimated Needs: 1.4-1.6L/day  Current Nutrition:  Soft diet + nutritional supplements, not meeting PO needs yet   Plan: Transitioned to *Cyclic* TPN on 12/16/79.   to run over 16 hours per MD request. [name] at 2000 per discussion with dietician to allow patient time for walking in  afternoon/evenings.  To run over 16 hours: -Start rate at 49 mL/hr for 1 hour. -Increase rate to 98 mL/hr for 13 hours.  -Decrease rate to 49 mL/hr for 1 hour. -Decrease rate to 25 mL/hr for 1 hour, then stop.  Plan:  See cyclic TPN rate above Nutritional Components Amino acids (using 15% Clinisol): 101 g Dextrose 19% = 266 g Lipids (using 20% SMOFlipids): 50.4 g kCal: 1817/24h  Electrolytes in TPN: Na 75 mEq/L, K 20mEq/L, Ca 25mEq/L, Mg 10 mEq/L, Phos 10 mmol/L  Continue ratio Cl:Ac 1:1  Continue trace elements, MVI, chromium, zinc and selenium per dietary recs  Continue CBG/SSI for Cyclic TPN:  -CBG 2 hrs after cyclic TPN start -CBG during middle of cyclic TPN infusion -CBG 1 hr after cylic TPN stopped -CBG while off TPN Continue SSI insulin at moderate scale and remove insulin from TPN bag starting 01/06/23 due to low BG Check TPN labs on Mondays  Barrie Folk, PharmD 01/06/2023

## 2023-01-06 NOTE — Progress Notes (Addendum)
Nutrition Follow-up  DOCUMENTATION CODES:   Obesity unspecified  INTERVENTION:   Continue cyclic TPN per pharmacy (16 hrs)- provides 1811kcal/day and 102g/day protein   Ensure Enlive po BID, each supplement provides 350 kcal and 20 grams of protein.  MVI, chromium and trace elements in TPN   Triglycerides checked monthly   Daily weights   Continue Zinc additional 5mg  daily added to TPN   Continue Selenium additional daily added to TPN   Vitamin A 10,000 units po daily   Will check routine vitamin labs every 3 months, next due 7/11  NUTRITION DIAGNOSIS:   Increased nutrient needs related to wound healing, catabolic illness as evidenced by estimated needs. -ongoing   GOAL:   Patient will meet greater than or equal to 90% of their needs -met with TPN   MONITOR:   PO intake, Supplement acceptance, Labs, Weight trends, Diet advancement, I & O's, TPN  ASSESSMENT:   57 y/o female with h/o hypothyroidism, COVID 19 (3/21), kidney stones and stage 3 colon cancer (s/p left hemicolectomy 5/21 and chemotherapy) who is admitted for new pelvic mass now s/p laparotomy 6/8 (with excision of pelvic mass from greater omentum, abdominal wall reconstruction with bilateral myocutaneous flaps and mesh, incisional hernia repair, appendectomy repair and VAC placement) complicated by bowel perforation s/p reopening of recent laparotomy 6/10 (with repair of small bowel perforation, excision of mesh, placement of two phasix mesh and VAC placement). Pathology returned as metastatic adenocarcinoma. Pt with L1 compression fracture.   Pt s/p right IJ port 4/12  Pt continues to tolerate TPN well at goal rate; TPN is currently being cycled for 16 hrs via port. Port noted to have lateral reservoir leaking. Triglycerides wnl and are being checked monthly. Hyperglycemia improved with insulin in TPN. Pt up ~24lbs since admission; PA aware. Will check routine vitamin labs every 3 months as pt on chronic  TPN (next due 7/11). Pt continues to have poor oral intake; pt eating <25% of meals. [name] pouch in place with unmeasured output. No discharge plan in place.   Medications reviewed and include: insulin, synthroid, protonix, vitamin A, TPN  -Selenium 81(L), zinc 44 wnl, -Chromium- 2.0 wnl, copper- 109 wnl, Manganese- 17.9 wnl, vitamin D 74.99 wnl, Iodine 70 wnl, aluminum 10(H)- 4/12 -Iron 25(L), TIBC 398, ferritin 46 wnl, folate 29.0 wnl, B12 922(H)- 4/12 -Vitamin B1- 73.2 wnl, vitamin A- 11.2(L), B6- 8.9 wnl, vitamin C- 0.9 wnl, vitamin E- 12.6 wnl, vitamin K- 0.49 wnl- 4/12 -Carnitine 32.0 wnl- 4/12  Labs reviewed: Na 130(L), K 3.8 wnl P 4.1 wnl, Mg 2.0 wnl- 4/29 Prealbumin- 11(L)- 3/31 Triglycerides- 129- 3/4 Wbc- 3.4(L), Hgb 7.9(L), Hct 24.9(L) Cbgs- 117, 69 x 24 hrs  Diet Order:    Diet Order             DIET SOFT Fluid consistency: Thin  Diet effective now                  EDUCATION NEEDS:   Not appropriate for education at this time  Skin:  Skin Assessment: Reviewed RN Assessment ( Midline wound: 11cm x 12cm )  Last BM:  4/29  Height:   Ht Readings from Last 1 Encounters:  12/19/22 4\' 11"  (1.499 m)    Weight:   Wt Readings from Last 1 Encounters:  01/06/23 100.5 kg    Ideal Body Weight:  44.3 kg  BMI:  Body mass index is 44.75 kg/m.  Estimated Nutritional Needs:   Kcal:  1800-2100kcal/day  Protein:  90-110g/d  Fluid:  1.4-1.6L/day  Betsey Holiday MS, RD, LDN Please refer to [facility] for RD and/or RD on-call/weekend/after hours pager

## 2023-01-07 LAB — GLUCOSE, CAPILLARY
Glucose-Capillary: 168 mg/dL — ABNORMAL HIGH (ref 70–99)
Glucose-Capillary: 93 mg/dL (ref 70–99)
Glucose-Capillary: 136 mg/dL — ABNORMAL HIGH (ref 70–99)
Glucose-Capillary: 128 mg/dL — ABNORMAL HIGH (ref 70–99)

## 2023-01-07 MED ORDER — ZINC CHLORIDE 1 MG/ML IV SOLN
INTRAVENOUS | Status: AC
  Filled 2023-01-07: qty 672

## 2023-01-07 NOTE — Progress Notes (Signed)
PHARMACY - TOTAL PARENTERAL NUTRITION CONSULT NOTE   Indication: Prolonged ileus  Patient Measurements: Height: 4\' 11"  (149.9 cm) Weight: 99.8 kg (220 lb 0.3 oz) IBW/kg (Calculated) : 43.2 TPN AdjBW (KG): 57.8 Body mass index is 44.44 kg/m.  Assessment: Debra [name] is a 56 y.o. female s/p laparotomy, excision of greater omental mass, abdominal wall reconstruction with [name], appendectomy, and placement of Prevena vac. New DVT and PICC was replaced 4/12.   Glucose / Insulin:  --No apparent history of diabetes --BG controlled on current regimen.  --rSSI 4x/day (0500, 1300, 1800, 2200) --SSI last 24h: 6 units  --BG/24h: 107 - 166  Electrolytes: Na low at baseline, stable: increased slightly in TPN 4/22 Renal: SCr stable at baseline  Hepatic: AST elevated at 59 (down from 68 on 4/29) GI Imaging: 9/11 CTAP: no new acute issues GI Surgeries / Procedures: s/p laparotomy, excision of greater omental mass, abdominal wall reconstruction with [name], appendectomy, and placement of Prevena vac 12/19/22 placement of right IJ port-a-catheter with dual reservoirs also s/p re-opening of laparotomy for repair of small bowel perforation   Central access: 02/15/22 TPN start date: 02/15/22  Nutritional Goals: Goal cyclic TPN over 16 hrs: cyclic TPN over 16 hrs: (provides 102 g of protein and 1811 kcals per day) total volume over 16hrs for calculations=1400 ml (1500 ml total with overfill)  RD Assessment:  Estimated Needs Total Energy Estimated Needs: 1800-2100kcal/day Total Protein Estimated Needs: 90-110g/d Total Fluid Estimated Needs: 1.4-1.6L/day  Current Nutrition:  Soft diet + nutritional supplements, not meeting PO needs yet   Plan: Transitioned to *Cyclic* TPN on 1/61/09.   to run over 16 hours per MD request. [name] at 2000 per discussion with dietician to allow patient time for walking in afternoon/evenings.  To run over 16 hours: -Start rate at  49 mL/hr for 1 hour. -Increase rate to 98 mL/hr for 13 hours.  -Decrease rate to 49 mL/hr for 1 hour. -Decrease rate to 25 mL/hr for 1 hour, then stop.  Plan:  See cyclic TPN rate above Nutritional Components Amino acids (using 15% Clinisol): 101 g Dextrose 19% = 266 g Lipids (using 20% SMOFlipids): 50.4 g kCal: 1817/24h  Electrolytes in TPN: Na 75 mEq/L, K 22mEq/L, Ca 52mEq/L, Mg 10 mEq/L, Phos 10 mmol/L  Continue ratio Cl:Ac 1:1  Continue trace elements, MVI, chromium, zinc and selenium per dietary recs  Continue CBG/SSI for Cyclic TPN:  -CBG 2 hrs after cyclic TPN start -CBG during middle of cyclic TPN infusion -CBG 1 hr after cylic TPN stopped -CBG while off TPN Continue SSI insulin at moderate scale and remove insulin from TPN bag starting 01/06/23 due to low BG Check TPN labs on Mondays  Barrie Folk, PharmD 01/07/2023

## 2023-01-07 NOTE — Progress Notes (Addendum)
[facility] SURGICAL PROGRESS NOTE   Hospital Day(s): 328.   Post op day(s): 19 Days Post-Op.   Interval History:  Patient seen and examined Nothing acute over night No abdominal pain, fever, chills No labs this morning  No oozing from ostomy; port site clean  [name] output unmeasured in last 24 hours; managing effluent independently On cyclic TPN + soft diet  Ambulating well; independent  Review of Systems:  Constitutional: denies fever, chills  HEENT: denies cough or congestion  Respiratory: denies any shortness of breath  Cardiovascular: denies chest pain or palpitations  Gastrointestinal: denied abdominal pain, denied N/V Genitourinary: denies burning with urination or urinary frequency Integumentary: + midline wound (healing; stable)  Vital signs in last 24 hours: [min-max] current  Temp:  [98.1 F (36.7 C)-98.9 F (37.2 C)] 98.5 F (36.9 C) (05/01 0744) Pulse Rate:  [66-89] 76 (05/01 0744) Resp:  [18-20] 18 (05/01 0744) BP: (90-124)/(51-72) 102/59 (05/01 0744) SpO2:  [96 %-100 %] 96 % (05/01 0744) Weight:  [99.8 kg] 99.8 kg (05/01 0421)     Height: 4\' 11"  (149.9 cm) Weight: 99.8 kg BMI (Calculated): 44.41   Intake/Output last 2 shifts:  04/30 0701 - 05/01 0700 In: 531.7 [P.O.:120; I.V.:411.7] Out: -    Physical Exam:  Constitutional: alert, cooperative and no distress  Respiratory: breathing non-labored at rest  Cardiovascular: regular rate and sinus rhythm  Gastrointestinal: Soft, non-tender, non-distended, no rebound/guarding. Integumentary: Midline wound healing by secondary intention, peritoneum closed; granulating and new skin growing over majority of wound now; there are three stomatized areas visible in the LUQ and LLQ portions of the wound. No active bleeding Chest: Port to the right chest; no further swelling or drainage; no evidence of infection   Labs:     Latest Ref Rng & Units 01/05/2023    7:32 AM 12/31/2022    5:53 AM 12/30/2022    10:35 AM  CBC  WBC 4.0 - 10.5 K/uL 3.4  3.5  3.0   Hemoglobin 12.0 - 15.0 g/dL 7.9  7.9  6.6   Hematocrit 36.0 - 46.0 % 24.9  24.1  20.9   Platelets 150 - 400 K/uL 83  93  85       Latest Ref Rng & Units 01/06/2023   10:11 AM 01/05/2023    7:32 AM 12/29/2022    7:22 AM  CMP  Glucose 70 - 99 mg/dL 161  096  045   BUN 6 - 20 mg/dL 10  19  20    Creatinine 0.44 - 1.00 mg/dL 4.09  8.11  9.14   Sodium 135 - 145 mmol/L 133  136  132   Potassium 3.5 - 5.1 mmol/L 3.8  3.9  4.5   Chloride 98 - 111 mmol/L 103  108  103   CO2 22 - 32 mmol/L 23  23  25    Calcium 8.9 - 10.3 mg/dL 8.2  8.2  7.9   Total Protein 6.5 - 8.1 g/dL 6.2  6.1  5.6   Total Bilirubin 0.3 - 1.2 mg/dL 1.1  0.7  0.3   Alkaline Phos 38 - 126 U/L 146  140  103   AST 15 - 41 U/L 59  68  33   ALT 0 - 44 U/L 42  42  26     Imaging studies: No new imaging studies this AM   Assessment/Plan:  57 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  - New 05/01: Nothing acute this morning   - Wound Care ([name] Pouch); Now on smaller [name] pouch; continues managing effluent independently. Intermittent oozing from time to time. Change as needed.    - Continue soft diet as tolerated  - Continue cyclic TPN; weekly nutritional labs - Appreciate dietary assistance - monitoring weight              - Monitor abdominal examination; on-going bowel function            - Pain control prn; antemetic prn            - Progressed with therapies; no longer any recommendations; ambulating well    - Discharge Planning: Continue to anticipate lengthy admission +/- possible transfer; no new progress regarding disposition  unfortunately   All of the above findings and recommendations were discussed with the patient, and the medical team, and all of patient's questions were answered to her expressed satisfaction.  -- [name], PA-C [facility] 01/07/2023, 8:22 AM M-F: 7am - 4pm

## 2023-01-07 DEATH — deceased

## 2023-01-08 LAB — GLUCOSE, CAPILLARY: Glucose-Capillary: 95 mg/dL (ref 70–99)

## 2023-01-08 MED ORDER — ZINC CHLORIDE 1 MG/ML IV SOLN
INTRAVENOUS | Status: AC
  Filled 2023-01-08: qty 672

## 2023-01-08 NOTE — Progress Notes (Signed)
Patient had moderate bleeding from [name] pouch. Notified provider. [name] pouch changed.

## 2023-01-08 NOTE — Progress Notes (Signed)
01/08/23  Came to see patient at the request of patient's RN due to bleeding coming from her [name] pouch.  On my arrival, there were blood clots that had been evacuated and also slow pooling of blood in the left inferior deep corner, which is a site of bleeding episodes in the past.  The area was suctioned and cleansed.  The specific bleeding spot was not well visualized due to this being deep.  There was mild oozing from the left superior stoma as well after cleaning, likely from irritation with the gauze.  After the area was cleaned, the bleeding had subsided on its own.  As a precaution, silver nitrate was applied to the left inferior corner and throughout the patient's raw areas in her wound.  A small piece of surgicel was also placed in the left deep corner going to the left superior stoma.  No further bleeding noted.  [name] pouch replaced by RN.  Henrene Dodge, MD

## 2023-01-08 NOTE — Progress Notes (Signed)
Pt seen this am AVSS No new issues Resting comfortably Continue tpn and eakin pouch

## 2023-01-08 NOTE — Progress Notes (Signed)
PHARMACY - TOTAL PARENTERAL NUTRITION CONSULT NOTE   Indication: Prolonged ileus  Patient Measurements: Height: 4\' 11"  (149.9 cm) Weight: 101 kg (222 lb 10.6 oz) IBW/kg (Calculated) : 43.2 TPN AdjBW (KG): 57.8 Body mass index is 44.97 kg/m.  Assessment: Debra [name] is a 57 y.o. female s/p laparotomy, excision of greater omental mass, abdominal wall reconstruction with [name], appendectomy, and placement of Prevena vac. New DVT and PICC was replaced 4/12.   Glucose / Insulin:  --No apparent history of diabetes --BG controlled on current regimen.  --rSSI 4x/day (0500, 1300, 1800, 2200) --SSI last 24h: 7 units  --BG/24h: 93-136  Electrolytes: Na low at baseline, stable: increased slightly in TPN 4/22 Renal: SCr stable at baseline  Hepatic: AST elevated at 59 (down from 68 on 4/29) GI Imaging: 9/11 CTAP: no new acute issues GI Surgeries / Procedures: s/p laparotomy, excision of greater omental mass, abdominal wall reconstruction with [name], appendectomy, and placement of Prevena vac 12/19/22 placement of right IJ port-a-catheter with dual reservoirs also s/p re-opening of laparotomy for repair of small bowel perforation   Central access: 02/15/22 TPN start date: 02/15/22  Nutritional Goals: Goal cyclic TPN over 16 hrs: cyclic TPN over 16 hrs: (provides 102 g of protein and 1811 kcals per day) total volume over 16hrs for calculations=1400 ml (1500 ml total with overfill)  RD Assessment:  Estimated Needs Total Energy Estimated Needs: 1800-2100kcal/day Total Protein Estimated Needs: 90-110g/d Total Fluid Estimated Needs: 1.4-1.6L/day  Current Nutrition:  Soft diet + nutritional supplements, not meeting PO needs yet   Plan: Transitioned to *Cyclic* TPN on 1/61/09.   to run over 16 hours per MD request. [name] at 2000 per discussion with dietician to allow patient time for walking in afternoon/evenings.  To run over 16 hours: -Start rate at 49  mL/hr for 1 hour. -Increase rate to 98 mL/hr for 13 hours.  -Decrease rate to 49 mL/hr for 1 hour. -Decrease rate to 25 mL/hr for 1 hour, then stop.  Plan:  See cyclic TPN rate above Nutritional Components Amino acids (using 15% Clinisol): 101 g Dextrose 19% = 266 g Lipids (using 20% SMOFlipids): 50.4 g kCal: 1817/24h  Electrolytes in TPN: Na 75 mEq/L, K 4mEq/L, Ca 58mEq/L, Mg 10 mEq/L, Phos 10 mmol/L  Continue ratio Cl:Ac 1:1  Continue trace elements, MVI, chromium, zinc and selenium per dietary recs  Continue CBG/SSI for Cyclic TPN:  -CBG 2 hrs after cyclic TPN start -CBG during middle of cyclic TPN infusion -CBG 1 hr after cylic TPN stopped -CBG while off TPN Continue SSI insulin at moderate scale and remove insulin from TPN bag starting 01/06/23 due to low BG Check TPN labs on Mondays  Barrie Folk, PharmD 01/08/2023

## 2023-01-09 LAB — GLUCOSE, CAPILLARY
Glucose-Capillary: 179 mg/dL — ABNORMAL HIGH (ref 70–99)
Glucose-Capillary: 162 mg/dL — ABNORMAL HIGH (ref 70–99)
Glucose-Capillary: 159 mg/dL — ABNORMAL HIGH (ref 70–99)
Glucose-Capillary: 118 mg/dL — ABNORMAL HIGH (ref 70–99)
Glucose-Capillary: 181 mg/dL — ABNORMAL HIGH (ref 70–99)
Glucose-Capillary: 193 mg/dL — ABNORMAL HIGH (ref 70–99)
Glucose-Capillary: 140 mg/dL — ABNORMAL HIGH (ref 70–99)
Glucose-Capillary: 141 mg/dL — ABNORMAL HIGH (ref 70–99)
Glucose-Capillary: 170 mg/dL — ABNORMAL HIGH (ref 70–99)

## 2023-01-09 LAB — CBC
HCT: 25.4 % — ABNORMAL LOW (ref 36.0–46.0)
Hemoglobin: 7.9 g/dL — ABNORMAL LOW (ref 12.0–15.0)
MCH: 27.6 pg (ref 26.0–34.0)
MCHC: 31.1 g/dL (ref 30.0–36.0)
MCV: 88.8 fL (ref 80.0–100.0)
Platelets: 77 10*3/uL — ABNORMAL LOW (ref 150–400)
RBC: 2.86 MIL/uL — ABNORMAL LOW (ref 3.87–5.11)
RDW: 15.7 % — ABNORMAL HIGH (ref 11.5–15.5)
WBC: 2.5 10*3/uL — ABNORMAL LOW (ref 4.0–10.5)
nRBC: 0 % (ref 0.0–0.2)

## 2023-01-09 MED ORDER — ZINC CHLORIDE 1 MG/ML IV SOLN
INTRAVENOUS | Status: AC
  Filled 2023-01-09: qty 672

## 2023-01-09 NOTE — Progress Notes (Signed)
PHARMACY - TOTAL PARENTERAL NUTRITION CONSULT NOTE   Indication: Prolonged ileus  Patient Measurements: Height: 4\' 11"  (149.9 cm) Weight: 101 kg (222 lb 10.6 oz) IBW/kg (Calculated) : 43.2 TPN AdjBW (KG): 57.8 Body mass index is 44.97 kg/m.  Assessment: Debra [name] is a 57 y.o. female s/p laparotomy, excision of greater omental mass, abdominal wall reconstruction with [name], appendectomy, and placement of Prevena vac. New DVT and PICC was replaced 4/12.   Glucose / Insulin:  --No apparent history of diabetes --BG controlled on current regimen.  --rSSI 4x/day (0500, 1300, 1800, 2200) --SSI last 24h: 3 units  --BG/24h: 95-193  Electrolytes: Na low at baseline, stable: increased slightly in TPN 4/22 Renal: SCr stable at baseline  Hepatic: AST elevated at 59 (down from 68 on 4/29) GI Imaging: 9/11 CTAP: no new acute issues GI Surgeries / Procedures: s/p laparotomy, excision of greater omental mass, abdominal wall reconstruction with [name], appendectomy, and placement of Prevena vac 12/19/22 placement of right IJ port-a-catheter with dual reservoirs also s/p re-opening of laparotomy for repair of small bowel perforation   Central access: 02/15/22 TPN start date: 02/15/22  Nutritional Goals: Goal cyclic TPN over 16 hrs: cyclic TPN over 16 hrs: (provides 102 g of protein and 1811 kcals per day) total volume over 16hrs for calculations=1400 ml (1500 ml total with overfill)  RD Assessment:  Estimated Needs Total Energy Estimated Needs: 1800-2100kcal/day Total Protein Estimated Needs: 90-110g/d Total Fluid Estimated Needs: 1.4-1.6L/day  Current Nutrition:  Soft diet + nutritional supplements, not meeting PO needs yet   Plan: Transitioned to *Cyclic* TPN on 11/14/63.   to run over 16 hours per MD request. [name] at 2000 per discussion with dietician to allow patient time for walking in afternoon/evenings.  To run over 16 hours: -Start rate at 49  mL/hr for 1 hour. -Increase rate to 98 mL/hr for 13 hours.  -Decrease rate to 49 mL/hr for 1 hour. -Decrease rate to 25 mL/hr for 1 hour, then stop.  Plan:  See cyclic TPN rate above Nutritional Components Amino acids (using 15% Clinisol): 101 g Dextrose 19% = 266 g Lipids (using 20% SMOFlipids): 50.4 g kCal: 1817/24h  Electrolytes in TPN: Na 75 mEq/L, K 55mEq/L, Ca 39mEq/L, Mg 10 mEq/L, Phos 10 mmol/L  Continue ratio Cl:Ac 1:1  Continue trace elements, MVI, chromium, zinc and selenium per dietary recs  Continue CBG/SSI for Cyclic TPN:  -CBG 2 hrs after cyclic TPN start -CBG during middle of cyclic TPN infusion -CBG 1 hr after cylic TPN stopped -CBG while off TPN Continue SSI insulin at moderate scale and remove insulin from TPN bag starting 01/06/23 due to low BG Check TPN labs on Mondays  Barrie Folk, PharmD 01/09/2023

## 2023-01-09 NOTE — Progress Notes (Signed)
Seen and examined this morning.  No acute issues.  Mild clots yesterday on [name] pouch. Dr. Aleen Campi glide some silver nitrate to some raw areas of her wound No further bleeding Hb  7.9   PE NAD Neck: soft, no infection, port right chest wall no infection Abd: eakin pouch with output w/o active bleeding  A/p Doing well Continue current supportive care.

## 2023-01-10 LAB — GLUCOSE, CAPILLARY: Glucose-Capillary: 120 mg/dL — ABNORMAL HIGH (ref 70–99)

## 2023-01-10 MED ORDER — ZINC CHLORIDE 1 MG/ML IV SOLN
INTRAVENOUS | Status: AC
  Filled 2023-01-10: qty 672

## 2023-01-10 NOTE — Progress Notes (Addendum)
[facility] SURGICAL PROGRESS NOTE   Hospital Day(s): 331.   Post op day(s): 22 Days Post-Op.   Interval History:  Patient seen and examined Nothing acute over night, c/o nausea this AM.  No abdominal pain, fever, chills No labs this morning  No oozing from ostomy; port site clean  [name] output incomplete in last 24 hours; managing effluent independently On cyclic TPN + soft diet  Ambulating well; independent  Review of Systems:  Not completed.  Vital signs in last 24 hours: [min-max] current  Temp:  [97.7 F (36.5 C)-98.1 F (36.7 C)] 97.7 F (36.5 C) (05/04 0814) Pulse Rate:  [80-99] 80 (05/04 0814) Resp:  [16-18] 16 (05/04 0814) BP: (105-110)/(58-66) 105/58 (05/04 0814) SpO2:  [95 %-100 %] 99 % (05/04 0814) Weight:  [100 kg] 100 kg (05/04 0500)     Height: 4\' 11"  (149.9 cm) Weight: 100 kg BMI (Calculated): 44.5   Intake/Output last 2 shifts:  05/03 0701 - 05/04 0700 In: 892.4 [P.O.:120; I.V.:772.4] Out: 300 [Stool:300]   Physical Exam:  Constitutional: alert, cooperative and no distress  Respiratory: breathing non-labored at rest  Cardiovascular: regular rate and sinus rhythm  Gastrointestinal: Soft, non-tender, non-distended, no rebound/guarding. Integumentary: Midline wound healing by secondary intention, peritoneum closed; granulating and new skin growing over majority of wound now; there are three stomatized areas visible in the LUQ and LLQ portions of the wound. No active bleeding Chest: Port to the right chest; no further swelling or drainage; no evidence of infection   Labs:     Latest Ref Rng & Units 01/09/2023    6:30 AM 01/05/2023    7:32 AM 12/31/2022    5:53 AM  CBC  WBC 4.0 - 10.5 K/uL 2.5  3.4  3.5   Hemoglobin 12.0 - 15.0 g/dL 7.9  7.9  7.9   Hematocrit 36.0 - 46.0 % 25.4  24.9  24.1   Platelets 150 - 400 K/uL 77  83  93       Latest Ref Rng & Units 01/06/2023   10:11 AM 01/05/2023    7:32 AM 12/29/2022    7:22 AM  CMP   Glucose 70 - 99 mg/dL 478  295  621   BUN 6 - 20 mg/dL 10  19  20    Creatinine 0.44 - 1.00 mg/dL 3.08  6.57  8.46   Sodium 135 - 145 mmol/L 133  136  132   Potassium 3.5 - 5.1 mmol/L 3.8  3.9  4.5   Chloride 98 - 111 mmol/L 103  108  103   CO2 22 - 32 mmol/L 23  23  25    Calcium 8.9 - 10.3 mg/dL 8.2  8.2  7.9   Total Protein 6.5 - 8.1 g/dL 6.2  6.1  5.6   Total Bilirubin 0.3 - 1.2 mg/dL 1.1  0.7  0.3   Alkaline Phos 38 - 126 U/L 146  140  103   AST 15 - 41 U/L 59  68  33   ALT 0 - 44 U/L 42  42  26     Imaging studies: No new imaging studies this AM   Assessment/Plan:  57 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  - New 05/04:  Nothing acute this morning   - Wound Care ([name] Pouch); Now on smaller [name] pouch; continues managing effluent independently. Intermittent oozing from time to time. Change as needed.    - Continue soft diet as tolerated  - Continue cyclic TPN; weekly nutritional labs - Appreciate dietary assistance - monitoring weight              - Monitor abdominal examination; on-going bowel function            - Pain control prn; antemetic prn            - Progressed with therapies; no longer any recommendations; ambulating well    - Discharge Planning: Continue to anticipate lengthy admission +/- possible transfer; no new progress regarding disposition unfortunately   All of the above findings and recommendations were discussed with the patient, and the medical team, and all of patient's questions were answered to her expressed satisfaction.  -- Campbell Lerner, M.D., [facility]  01/10/2023 ; 11:27 AM  Returned  to eval reported bleeding from wound, some dilute dark blood mixed with succus noted in pouch.  No bright red blood, nor active bleeding noted.  Will repeat CBC in AM.

## 2023-01-10 NOTE — Progress Notes (Signed)
PHARMACY - TOTAL PARENTERAL NUTRITION CONSULT NOTE   Indication: Prolonged ileus  Patient Measurements: Height: 4\' 11"  (149.9 cm) Weight: 100 kg (220 lb 7.4 oz) IBW/kg (Calculated) : 43.2 TPN AdjBW (KG): 57.8 Body mass index is 44.53 kg/m.  Assessment: Debra [name] is a 57 y.o. female s/p laparotomy, excision of greater omental mass, abdominal wall reconstruction with [name], appendectomy, and placement of Prevena vac. New DVT and PICC was replaced 4/12.   Glucose / Insulin:  --No apparent history of diabetes --BG controlled on current regimen.  --rSSI 4x/day (0500, 1300, 1800, 2200) --SSI last 24h: 3 units  --BG/24h: 95-193 > 170  Electrolytes: Na low at baseline, stable: increased slightly in TPN 4/22 Renal: SCr stable at baseline  Hepatic: AST elevated at 59 (down from 68 on 4/29) GI Imaging: 9/11 CTAP: no new acute issues GI Surgeries / Procedures: s/p laparotomy, excision of greater omental mass, abdominal wall reconstruction with [name], appendectomy, and placement of Prevena vac 12/19/22 placement of right IJ port-a-catheter with dual reservoirs also s/p re-opening of laparotomy for repair of small bowel perforation   Central access: 02/15/22 TPN start date: 02/15/22  Nutritional Goals: Goal cyclic TPN over 16 hrs: cyclic TPN over 16 hrs: (provides 102 g of protein and 1811 kcals per day) total volume over 16hrs for calculations=1400 ml (1500 ml total with overfill)  RD Assessment:  Estimated Needs Total Energy Estimated Needs: 1800-2100kcal/day Total Protein Estimated Needs: 90-110g/d Total Fluid Estimated Needs: 1.4-1.6L/day  Current Nutrition:  Soft diet + nutritional supplements, not meeting PO needs yet   Plan: Transitioned to *Cyclic* TPN on 1/47/82.   to run over 16 hours per MD request. [name] at 2000 per discussion with dietician to allow patient time for walking in afternoon/evenings.  To run over 16 hours: -Start rate  at 49 mL/hr for 1 hour. -Increase rate to 98 mL/hr for 13 hours.  -Decrease rate to 49 mL/hr for 1 hour. -Decrease rate to 25 mL/hr for 1 hour, then stop.  Plan:  See cyclic TPN rate above Nutritional Components Amino acids (using 15% Clinisol): 101 g Dextrose 19% = 266 g Lipids (using 20% SMOFlipids): 50.4 g kCal: 1817/24h  Electrolytes in TPN: Na 75 mEq/L, K 18mEq/L, Ca 34mEq/L, Mg 10 mEq/L, Phos 10 mmol/L  Continue ratio Cl:Ac 1:1  Continue trace elements, MVI, chromium, zinc and selenium per dietary recs  Continue CBG/SSI for Cyclic TPN:  -CBG 2 hrs after cyclic TPN start -CBG during middle of cyclic TPN infusion -CBG 1 hr after cylic TPN stopped -CBG while off TPN Continue SSI insulin at moderate scale and remove insulin from TPN bag starting 01/06/23 due to low BG Check TPN labs on Mondays  Ronnald Ramp, PharmD 01/10/2023

## 2023-01-10 NOTE — Plan of Care (Signed)
  Problem: Clinical Measurements: Goal: Will remain free from infection Outcome: Progressing   Problem: Nutrition: Goal: Adequate nutrition will be maintained Outcome: Progressing   Problem: Activity: Goal: Risk for activity intolerance will decrease Outcome: Progressing   Problem: Nutrition: Goal: Adequate nutrition will be maintained Outcome: Progressing   Problem: Coping: Goal: Level of anxiety will decrease Outcome: Progressing   Problem: Pain Managment: Goal: General experience of comfort will improve Outcome: Progressing   

## 2023-01-11 LAB — TYPE AND SCREEN: ABO/RH(D): O POS

## 2023-01-11 LAB — CBC
HCT: 20.7 % — ABNORMAL LOW (ref 36.0–46.0)
Hemoglobin: 6.5 g/dL — ABNORMAL LOW (ref 12.0–15.0)
MCH: 27.8 pg (ref 26.0–34.0)
MCHC: 31.4 g/dL (ref 30.0–36.0)
MCV: 88.5 fL (ref 80.0–100.0)
Platelets: 77 10*3/uL — ABNORMAL LOW (ref 150–400)
RBC: 2.34 MIL/uL — ABNORMAL LOW (ref 3.87–5.11)
RDW: 15.5 % (ref 11.5–15.5)
WBC: 2.6 10*3/uL — ABNORMAL LOW (ref 4.0–10.5)
nRBC: 0 % (ref 0.0–0.2)

## 2023-01-11 LAB — PREPARE RBC (CROSSMATCH)

## 2023-01-11 LAB — BPAM RBC
Blood Product Expiration Date: 202405232359
ISSUE DATE / TIME: 202405051740

## 2023-01-11 LAB — GLUCOSE, CAPILLARY: Glucose-Capillary: 130 mg/dL — ABNORMAL HIGH (ref 70–99)

## 2023-01-11 MED ORDER — SODIUM CHLORIDE 0.9% IV SOLUTION: Freq: Once | INTRAVENOUS | Status: DC

## 2023-01-11 MED ORDER — ZINC CHLORIDE 1 MG/ML IV SOLN
INTRAVENOUS | Status: AC
  Filled 2023-01-11: qty 672

## 2023-01-11 NOTE — Progress Notes (Signed)
PHARMACY - TOTAL PARENTERAL NUTRITION CONSULT NOTE   Indication: Prolonged ileus  Patient Measurements: Height: 4\' 11"  (149.9 cm) Weight: 101.1 kg (222 lb 14.2 oz) IBW/kg (Calculated) : 43.2 TPN AdjBW (KG): 57.8 Body mass index is 45.02 kg/m.  Assessment: Debra [name] is a 57 y.o. female s/p laparotomy, excision of greater omental mass, abdominal wall reconstruction with [name], appendectomy, and placement of Prevena vac. New DVT and PICC was replaced 4/12.   Glucose / Insulin:  --No apparent history of diabetes --BG controlled on current regimen.  --rSSI 4x/day (0500, 1300, 1800, 2200) --SSI last 24h: 9 units  --BG/24h: Only one time point documented in chart which was 120 Electrolytes: Na low at baseline, stable: increased slightly in TPN 4/22 Renal: SCr stable at baseline  Hepatic: AST elevated at 59 (down from 68 on 4/29) GI Imaging: 9/11 CTAP: no new acute issues GI Surgeries / Procedures: s/p laparotomy, excision of greater omental mass, abdominal wall reconstruction with [name], appendectomy, and placement of Prevena vac 12/19/22 placement of right IJ port-a-catheter with dual reservoirs also s/p re-opening of laparotomy for repair of small bowel perforation   Central access: 02/15/22 TPN start date: 02/15/22  Nutritional Goals: Goal cyclic TPN over 16 hrs: cyclic TPN over 16 hrs: (provides 102 g of protein and 1811 kcals per day) total volume over 16hrs for calculations=1400 ml (1500 ml total with overfill)  RD Assessment:  Estimated Needs Total Energy Estimated Needs: 1800-2100kcal/day Total Protein Estimated Needs: 90-110g/d Total Fluid Estimated Needs: 1.4-1.6L/day  Current Nutrition:  Soft diet + nutritional supplements, not meeting PO needs yet   Plan: Transitioned to *Cyclic* TPN on 1/61/09.   to run over 16 hours per MD request. Debra [name] at 2000 per discussion with dietician to allow patient time for walking in  afternoon/evenings.  To run over 16 hours: -Start rate at 49 mL/hr for 1 hour. -Increase rate to 98 mL/hr for 13 hours.  -Decrease rate to 49 mL/hr for 1 hour. -Decrease rate to 25 mL/hr for 1 hour, then stop.  Plan:  See cyclic TPN rate above Nutritional Components Amino acids (using 15% Clinisol): 101 g Dextrose 19% = 266 g Lipids (using 20% SMOFlipids): 50.4 g kCal: 1817/24h  Electrolytes in TPN: Na 75 mEq/L, K 14mEq/L, Ca 42mEq/L, Mg 10 mEq/L, Phos 10 mmol/L  Continue ratio Cl:Ac 1:1  Continue trace elements, MVI, chromium, zinc and selenium per dietary recs  Continue CBG/SSI for Cyclic TPN:  -CBG 2 hrs after cyclic TPN start -CBG during middle of cyclic TPN infusion -CBG 1 hr after cylic TPN stopped -CBG while off TPN Continue SSI insulin at moderate scale and remove insulin from TPN bag starting 01/06/23 due to low BG Check TPN labs on Mondays  [name] 01/11/2023

## 2023-01-11 NOTE — Progress Notes (Signed)
[facility] SURGICAL PROGRESS NOTE   Hospital Day(s): 332.   Post op day(s): 23 Days Post-Op.   Interval History:  Patient seen and examined Nothing acute over night, c/o nausea this AM.  No abdominal pain, fever, chills No labs this morning  Had bright red blood from [name]'s pouch this morning; port site clean  [name] output self managed, managing effluent independently,  On cyclic TPN + soft diet  Ambulating well; independent  Review of Systems:  Not completed.  Vital signs in last 24 hours: [min-max] current  Temp:  [98.3 F (36.8 C)-99 F (37.2 C)] 99 F (37.2 C) (05/05 0852) Pulse Rate:  [77-88] 77 (05/05 0852) Resp:  [16-18] 16 (05/05 0852) BP: (93-102)/(49-62) 102/49 (05/05 0852) SpO2:  [99 %-100 %] 100 % (05/05 0852) Weight:  [101.1 kg] 101.1 kg (05/05 0500)     Height: 4\' 11"  (149.9 cm) Weight: 101.1 kg BMI (Calculated): 44.99   Intake/Output last 2 shifts:  05/04 0701 - 05/05 0700 In: 675.5 [P.O.:120; I.V.:555.5] Out: 50 [Drains:50]   Physical Exam:  Constitutional: alert, cooperative and no distress  Respiratory: breathing non-labored at rest  Cardiovascular: regular rate and sinus rhythm  Gastrointestinal: Soft, non-tender, non-distended, no rebound/guarding. Integumentary: Midline wound healing by secondary intention, peritoneum closed; granulating and new skin growing over majority of wound now; there are three stomatized areas visible in the LUQ and LLQ portions of the wound. No active bleeding.  Fresh [name] pouch applied.  No evidence of bleeding for intervention. Chest: Port to the right chest; no further swelling or drainage; no evidence of infection   Labs:     Latest Ref Rng & Units 01/11/2023    4:20 AM 01/09/2023    6:30 AM 01/05/2023    7:32 AM  CBC  WBC 4.0 - 10.5 K/uL 2.6  2.5  3.4   Hemoglobin 12.0 - 15.0 g/dL 6.5  7.9  7.9   Hematocrit 36.0 - 46.0 % 20.7  25.4  24.9   Platelets 150 - 400 K/uL 77  77  83       Latest Ref  Rng & Units 01/06/2023   10:11 AM 01/05/2023    7:32 AM 12/29/2022    7:22 AM  CMP  Glucose 70 - 99 mg/dL 161  096  045   BUN 6 - 20 mg/dL 10  19  20    Creatinine 0.44 - 1.00 mg/dL 4.09  8.11  9.14   Sodium 135 - 145 mmol/L 133  136  132   Potassium 3.5 - 5.1 mmol/L 3.8  3.9  4.5   Chloride 98 - 111 mmol/L 103  108  103   CO2 22 - 32 mmol/L 23  23  25    Calcium 8.9 - 10.3 mg/dL 8.2  8.2  7.9   Total Protein 6.5 - 8.1 g/dL 6.2  6.1  5.6   Total Bilirubin 0.3 - 1.2 mg/dL 1.1  0.7  0.3   Alkaline Phos 38 - 126 U/L 146  140  103   AST 15 - 41 U/L 59  68  33   ALT 0 - 44 U/L 42  42  26     Imaging studies: No new imaging studies this AM   Assessment/Plan:  57 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  - New 05/05: Bright red blood in pouch this morning, no site appreciated.  No longer bleeding.  Hemoglobin 6.5, will transfuse 2 units packed red blood cells.  - Wound Care ([name] Pouch); Now on smaller [name] pouch; continues managing effluent independently. Intermittent oozing from time to time. Change as needed.    -Monitor thrombocytopenia  - Continue soft diet as tolerated  - Continue cyclic TPN; weekly nutritional labs - Appreciate dietary assistance - monitoring weight              - Monitor abdominal examination; on-going bowel function            - Pain control prn; antemetic prn            - Progressed with therapies; no longer any recommendations; ambulating well    - Discharge Planning: Continue to anticipate lengthy admission +/- possible transfer; no new progress regarding disposition unfortunately   All of the above findings and recommendations  were discussed with the patient, and the medical team, and all of patient's questions were answered to her expressed satisfaction.  -- Campbell Lerner, M.D., [facility]  01/11/2023 ; 3:40 PM  Returned to eval reported bleeding from wound, some dilute dark blood mixed with succus noted in pouch.  No bright red blood, nor active bleeding noted.  Will repeat CBC in AM.

## 2023-01-12 LAB — TRIGLYCERIDES: Triglycerides: 76 mg/dL (ref <150)

## 2023-01-12 LAB — CBC
HCT: 25.5 % — ABNORMAL LOW (ref 36.0–46.0)
Hemoglobin: 8.4 g/dL — ABNORMAL LOW (ref 12.0–15.0)
MCH: 28.1 pg (ref 26.0–34.0)
MCHC: 32.9 g/dL (ref 30.0–36.0)
MCV: 85.3 fL (ref 80.0–100.0)
Platelets: 81 10*3/uL — ABNORMAL LOW (ref 150–400)
RBC: 2.99 MIL/uL — ABNORMAL LOW (ref 3.87–5.11)
RDW: 15.2 % (ref 11.5–15.5)
WBC: 3.2 10*3/uL — ABNORMAL LOW (ref 4.0–10.5)
nRBC: 0 % (ref 0.0–0.2)

## 2023-01-12 LAB — GLUCOSE, CAPILLARY
Glucose-Capillary: 191 mg/dL — ABNORMAL HIGH (ref 70–99)
Glucose-Capillary: 196 mg/dL — ABNORMAL HIGH (ref 70–99)
Glucose-Capillary: 131 mg/dL — ABNORMAL HIGH (ref 70–99)
Glucose-Capillary: 198 mg/dL — ABNORMAL HIGH (ref 70–99)
Glucose-Capillary: 219 mg/dL — ABNORMAL HIGH (ref 70–99)
Glucose-Capillary: 105 mg/dL — ABNORMAL HIGH (ref 70–99)
Glucose-Capillary: 222 mg/dL — ABNORMAL HIGH (ref 70–99)
Glucose-Capillary: 151 mg/dL — ABNORMAL HIGH (ref 70–99)
Glucose-Capillary: 138 mg/dL — ABNORMAL HIGH (ref 70–99)
Glucose-Capillary: 126 mg/dL — ABNORMAL HIGH (ref 70–99)

## 2023-01-12 LAB — TYPE AND SCREEN
Antibody Screen: NEGATIVE
Unit division: 0
Unit division: 0

## 2023-01-12 LAB — BPAM RBC
Blood Product Expiration Date: 202405312359
ISSUE DATE / TIME: 202405051615
Unit Type and Rh: 5100
Unit Type and Rh: 5100

## 2023-01-12 LAB — COMPREHENSIVE METABOLIC PANEL
ALT: 23 U/L (ref 0–44)
AST: 35 U/L (ref 15–41)
Albumin: 2.3 g/dL — ABNORMAL LOW (ref 3.5–5.0)
Alkaline Phosphatase: 101 U/L (ref 38–126)
Anion gap: 4 — ABNORMAL LOW (ref 5–15)
BUN: 23 mg/dL — ABNORMAL HIGH (ref 6–20)
CO2: 24 mmol/L (ref 22–32)
Calcium: 7.8 mg/dL — ABNORMAL LOW (ref 8.9–10.3)
Chloride: 108 mmol/L (ref 98–111)
Creatinine, Ser: 0.42 mg/dL — ABNORMAL LOW (ref 0.44–1.00)
GFR, Estimated: >60 mL/min (ref >60)
Glucose, Bld: 210 mg/dL — ABNORMAL HIGH (ref 70–99)
Potassium: 4.7 mmol/L (ref 3.5–5.1)
Sodium: 136 mmol/L (ref 135–145)
Total Bilirubin: 0.6 mg/dL (ref 0.3–1.2)
Total Protein: 5.2 g/dL — ABNORMAL LOW (ref 6.5–8.1)

## 2023-01-12 LAB — MAGNESIUM: Magnesium: 2 mg/dL (ref 1.7–2.4)

## 2023-01-12 LAB — PHOSPHORUS: Phosphorus: 2.7 mg/dL (ref 2.5–4.6)

## 2023-01-12 MED ORDER — ZINC CHLORIDE 1 MG/ML IV SOLN
INTRAVENOUS | Status: AC
  Filled 2023-01-12: qty 672

## 2023-01-12 MED ORDER — ZINC CHLORIDE 1 MG/ML IV SOLN: INTRAVENOUS | Status: DC

## 2023-01-12 NOTE — Progress Notes (Signed)
Pt had some intermittent oozing from eakin Transfused 2 units w good response No other issues  PE NAD Abd" soft , nt EF fistula w eakin pouch, no evidence of active bleeding but some sanguinous fluid on canister  A/P we will do topical ice to prevent oozing Recheck hb in am No need for surgical intervention Continue TPN and wound care

## 2023-01-12 NOTE — Progress Notes (Signed)
PHARMACY - TOTAL PARENTERAL NUTRITION CONSULT NOTE   Indication: Prolonged ileus  Patient Measurements: Height: 4\' 11"  (149.9 cm) Weight: 102 kg (224 lb 13.9 oz) IBW/kg (Calculated) : 43.2 TPN AdjBW (KG): 57.8 Body mass index is 45.42 kg/m.  Assessment: Debra [name] is a 57 y.o. female s/p laparotomy, excision of greater omental mass, abdominal wall reconstruction with [name], appendectomy, and placement of Prevena vac. New DVT and PICC was replaced 4/12.   Glucose / Insulin:  --No apparent history of diabetes --BG controlled on current regimen.  --rSSI 4x/day (0500, 1300, 1800, 2200) --SSI last 24h: 7 units  Electrolytes: Na 136, K 4.7, Cl 108, Phos 2.7, Mg 2.0, Ca 7.8 Triglycerides = 76 Renal: SCr stable at baseline  Hepatic: AST elevated at 59 (down from 68 on 4/29) GI Imaging: 9/11 CTAP: no new acute issues GI Surgeries / Procedures: s/p laparotomy, excision of greater omental mass, abdominal wall reconstruction with [name], appendectomy, and placement of Prevena vac 12/19/22 placement of right IJ port-a-catheter with dual reservoirs also s/p re-opening of laparotomy for repair of small bowel perforation   Central access: 02/15/22 TPN start date: 02/15/22  Nutritional Goals: Goal cyclic TPN over 16 hrs: cyclic TPN over 16 hrs: (provides 102 g of protein and 1811 kcals per day) total volume over 16hrs for calculations=1400 ml (1500 ml total with overfill)  RD Assessment:  Estimated Needs Total Energy Estimated Needs: 1800-2100kcal/day Total Protein Estimated Needs: 90-110g/d Total Fluid Estimated Needs: 1.4-1.6L/day  Current Nutrition:  Soft diet + nutritional supplements, not meeting PO needs  Plan: Transitioned to *Cyclic* TPN on 1/47/82.   to run over 16 hours per MD request. [name] at 2000 per discussion with dietician to allow patient time for walking in afternoon/evenings.  To run over 16 hours: -Start rate at 49 mL/hr for 1  hour. -Increase rate to 98 mL/hr for 13 hours.  -Decrease rate to 49 mL/hr for 1 hour. -Decrease rate to 25 mL/hr for 1 hour, then stop.  Plan:  See cyclic TPN rate above Nutritional Components Amino acids (using 15% Clinisol): 101 g Dextrose 19% = 266 g Lipids (using 20% SMOFlipids): 50.4 g kCal: 1817/24h  Electrolytes in TPN: Na 75 mEq/L, K 74mEq/L, Ca 56mEq/L, Mg 10 mEq/L, Phos 10 mmol/L  Continue ratio Cl:Ac 1:1  Continue trace elements, MVI, chromium, zinc and selenium per dietary recs  Continue CBG/SSI for Cyclic TPN:  -CBG 2 hrs after cyclic TPN start -CBG during middle of cyclic TPN infusion -CBG 1 hr after cylic TPN stopped -CBG while off TPN Continue SSI insulin at moderate scale and remove insulin from TPN bag starting 01/06/23 due to low BG Check TPN labs on Mondays (CMP, phos, Mg, trig)  Sirr Kabel Rodriguez-Guzman PharmD, BCPS 01/12/2023 8:43 AM

## 2023-01-13 LAB — CBC
HCT: 22.7 % — ABNORMAL LOW (ref 36.0–46.0)
Hemoglobin: 7.5 g/dL — ABNORMAL LOW (ref 12.0–15.0)
MCH: 28.5 pg (ref 26.0–34.0)
MCHC: 33 g/dL (ref 30.0–36.0)
MCV: 86.3 fL (ref 80.0–100.0)
Platelets: 80 10*3/uL — ABNORMAL LOW (ref 150–400)
RBC: 2.63 MIL/uL — ABNORMAL LOW (ref 3.87–5.11)
RDW: 15.5 % (ref 11.5–15.5)
WBC: 3.2 10*3/uL — ABNORMAL LOW (ref 4.0–10.5)
nRBC: 0 % (ref 0.0–0.2)

## 2023-01-13 LAB — GLUCOSE, CAPILLARY
Glucose-Capillary: 210 mg/dL — ABNORMAL HIGH (ref 70–99)
Glucose-Capillary: 130 mg/dL — ABNORMAL HIGH (ref 70–99)
Glucose-Capillary: 137 mg/dL — ABNORMAL HIGH (ref 70–99)

## 2023-01-13 MED ORDER — ZINC CHLORIDE 1 MG/ML IV SOLN
INTRAVENOUS | Status: AC
  Filled 2023-01-13: qty 672

## 2023-01-13 MED ORDER — PROCHLORPERAZINE EDISYLATE 10 MG/2ML IJ SOLN
10.0000 mg | Freq: Four times a day (QID) | INTRAMUSCULAR | Status: DC | PRN
  Administered 2023-03-01: 10 mg via INTRAVENOUS
  Filled 2023-01-13: qty 2

## 2023-01-13 NOTE — Progress Notes (Signed)
[facility] SURGICAL PROGRESS NOTE   Hospital Day(s): 334.   Post op day(s): 25 Days Post-Op.   Interval History:  Patient seen and examined Continues to have intermittent episodes of bleeding; again overnight  Hgb 7.5 this AM No abdominal pain, fever, chills [name] output 225 ccs + unmeasured in last 24 hours; managing effluent independently On cyclic TPN + soft diet  Ambulating well; independent  Review of Systems:  Constitutional: denies fever, chills  HEENT: denies cough or congestion  Respiratory: denies any shortness of breath  Cardiovascular: denies chest pain or palpitations  Gastrointestinal: denied abdominal pain, denied N/V Genitourinary: denies burning with urination or urinary frequency Integumentary: + midline wound (healing; stable)  Vital signs in last 24 hours: [min-max] current  Temp:  [98.4 F (36.9 C)-98.8 F (37.1 C)] 98.4 F (36.9 C) (05/07 0300) Pulse Rate:  [71-79] 79 (05/07 0300) Resp:  [18] 18 (05/07 0300) BP: (98-108)/(53-57) 108/57 (05/07 0300) SpO2:  [98 %-100 %] 98 % (05/07 0300) Weight:  [100.7 kg] 100.7 kg (05/07 0433)     Height: 4\' 11"  (149.9 cm) Weight: 100.7 kg BMI (Calculated): 44.82   Intake/Output last 2 shifts:  05/06 0701 - 05/07 0700 In: 1520 [P.O.:100; I.V.:1420] Out: 350 [Drains:225; Blood:125]   Physical Exam:  Constitutional: alert, cooperative and no distress  Respiratory: breathing non-labored at rest  Cardiovascular: regular rate and sinus rhythm  Gastrointestinal: Soft, non-tender, non-distended, no rebound/guarding. Integumentary: Midline wound healing by secondary intention, peritoneum closed; granulating and new skin growing over majority of wound now; there are three stomatized areas visible in the LUQ and LLQ portions of the wound. No active bleeding Chest: Port to the right chest; no further swelling or drainage; no evidence of infection   Labs:     Latest Ref Rng & Units 01/13/2023    6:33 AM  01/12/2023    8:33 AM 01/11/2023    4:20 AM  CBC  WBC 4.0 - 10.5 K/uL 3.2  3.2  2.6   Hemoglobin 12.0 - 15.0 g/dL 7.5  8.4  6.5   Hematocrit 36.0 - 46.0 % 22.7  25.5  20.7   Platelets 150 - 400 K/uL 80  81  77       Latest Ref Rng & Units 01/12/2023    8:33 AM 01/06/2023   10:11 AM 01/05/2023    7:32 AM  CMP  Glucose 70 - 99 mg/dL 161  096  045   BUN 6 - 20 mg/dL 23  10  19    Creatinine 0.44 - 1.00 mg/dL 4.09  8.11  9.14   Sodium 135 - 145 mmol/L 136  133  136   Potassium 3.5 - 5.1 mmol/L 4.7  3.8  3.9   Chloride 98 - 111 mmol/L 108  103  108   CO2 22 - 32 mmol/L 24  23  23    Calcium 8.9 - 10.3 mg/dL 7.8  8.2  8.2   Total Protein 6.5 - 8.1 g/dL 5.2  6.2  6.1   Total Bilirubin 0.3 - 1.2 mg/dL 0.6  1.1  0.7   Alkaline Phos 38 - 126 U/L 101  146  140   AST 15 - 41 U/L 35  59  68   ALT 0 - 44 U/L 23  42  42     Imaging studies: No new imaging studies this AM   Assessment/Plan:  57 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  - New 05/07: Intermittent oozing over the weekend and overnight last night; seems to be controlled; Hgb responded to recent transfusion; trending down. Has needed transfused intermittent. Monitor H&H.   - Wound Care ([name] Pouch); Now on smaller [name] pouch; continues managing effluent independently. Intermittent oozing from time to time. Change as needed.    - Continue soft diet as tolerated  - Continue cyclic TPN; weekly nutritional labs - Appreciate dietary assistance - monitoring weight              - Monitor abdominal examination; on-going bowel function            - Pain control prn; antemetic prn            - Progressed with  therapies; no longer any recommendations; ambulating well    - Discharge Planning: Continue to anticipate lengthy admission +/- possible transfer; no new progress regarding disposition unfortunately   All of the above findings and recommendations were discussed with the patient, and the medical team, and all of patient's questions were answered to her expressed satisfaction.  -- [name], PA-C [facility] 01/13/2023, 8:22 AM M-F: 7am - 4pm

## 2023-01-13 NOTE — Progress Notes (Signed)
PHARMACY - TOTAL PARENTERAL NUTRITION CONSULT NOTE   Indication: Prolonged ileus  Patient Measurements: Height: 4\' 11"  (149.9 cm) Weight: 100.7 kg (222 lb 0.1 oz) IBW/kg (Calculated) : 43.2 TPN AdjBW (KG): 57.8 Body mass index is 44.84 kg/m.  Assessment: Debra [name] is a 57 y.o. female s/p laparotomy, excision of greater omental mass, abdominal wall reconstruction with [name], appendectomy, and placement of Prevena vac. New PICC was replaced 4/12.   Glucose / Insulin:  --No apparent history of diabetes --BG controlled on current regimen.  --rSSI 4x/day (0500, 1300, 1800, 2200) --SSI last 24h: 7 units  Electrolytes: Na 136, K 4.7, Cl 108, Phos 2.7, Mg 2.0, Ca 7.8 Triglycerides = 76 Renal: SCr stable at baseline  Hepatic: AST elevated at 59 (down from 68 on 4/29) GI Imaging: 9/11 CTAP: no new acute issues GI Surgeries / Procedures: s/p laparotomy, excision of greater omental mass, abdominal wall reconstruction with [name], appendectomy, and placement of Prevena vac 12/19/22 placement of right IJ port-a-catheter with dual reservoirs also s/p re-opening of laparotomy for repair of small bowel perforation   Central access: 02/15/22 TPN start date: 02/15/22  Nutritional Goals: Goal cyclic TPN over 16 hrs: cyclic TPN over 16 hrs: (provides 102 g of protein and 1811 kcals per day) total volume over 16hrs for calculations=1400 ml (1500 ml total with overfill)  RD Assessment:  Estimated Needs Total Energy Estimated Needs: 1800-2100kcal/day Total Protein Estimated Needs: 90-110g/d Total Fluid Estimated Needs: 1.4-1.6L/day  Current Nutrition:  Soft diet + nutritional supplements, not meeting PO needs  Plan: Transitioned to *Cyclic* TPN on 1/61/09.   to run over 16 hours per MD request. [name] at 2000 per discussion with dietician to allow patient time for walking in afternoon/evenings.  To run over 16 hours: -Start rate at 49 mL/hr for 1  hour. -Increase rate to 98 mL/hr for 13 hours.  -Decrease rate to 49 mL/hr for 1 hour. -Decrease rate to 25 mL/hr for 1 hour, then stop.  Plan:  See cyclic TPN rate above Nutritional Components Amino acids (using 15% Clinisol): 101 g Dextrose 19% = 266 g Lipids (using 20% SMOFlipids): 50.4 g kCal: 1817/24h  Electrolytes in TPN: Na 75 mEq/L, K 26mEq/L, Ca 58mEq/L, Mg 10 mEq/L, Phos 10 mmol/L  Continue ratio Cl:Ac 1:1  Continue trace elements, MVI, chromium, zinc and selenium per dietary recs  Continue CBG/SSI for Cyclic TPN:  -CBG 2 hrs after cyclic TPN start -CBG during middle of cyclic TPN infusion -CBG 1 hr after cylic TPN stopped -CBG while off TPN Continue SSI insulin at moderate scale and remove insulin from TPN bag starting 01/06/23 due to low BG Check TPN labs on Mondays (CMP, phos, Mg, trig)  Edana Aguado Rodriguez-Guzman PharmD, BCPS 01/13/2023 8:36 AM

## 2023-01-13 NOTE — Progress Notes (Signed)
Nutrition Follow-up  DOCUMENTATION CODES:   Obesity unspecified  INTERVENTION:   Continue cyclic TPN per pharmacy (16 hrs)- provides 1811kcal/day and 102g/day protein   Ensure Enlive po BID, each supplement provides 350 kcal and 20 grams of protein.  MVI, chromium and trace elements in TPN   Triglycerides checked monthly   Daily weights   Continue Zinc additional 5mg  daily added to TPN   Continue Selenium additional daily added to TPN   Vitamin A 10,000 units po daily   Will check routine vitamin labs every 3 months, next due 7/11  NUTRITION DIAGNOSIS:   Increased nutrient needs related to wound healing, catabolic illness as evidenced by estimated needs. -ongoing   GOAL:   Patient will meet greater than or equal to 90% of their needs -met with TPN   MONITOR:   PO intake, Supplement acceptance, Labs, Weight trends, Diet advancement, I & O's, TPN  ASSESSMENT:   57 y/o female with h/o hypothyroidism, COVID 19 (3/21), kidney stones and stage 3 colon cancer (s/p left hemicolectomy 5/21 and chemotherapy) who is admitted for new pelvic mass now s/p laparotomy 6/8 (with excision of pelvic mass from greater omentum, abdominal wall reconstruction with bilateral myocutaneous flaps and mesh, incisional hernia repair, appendectomy repair and VAC placement) complicated by bowel perforation s/p reopening of recent laparotomy 6/10 (with repair of small bowel perforation, excision of mesh, placement of two phasix mesh and VAC placement). Pathology returned as metastatic adenocarcinoma. Pt with L1 compression fracture.   Pt s/p right IJ port 4/12  Pt continues to tolerate TPN well at goal rate; TPN is currently being cycled for 16 hrs via port. Triglycerides wnl and are being checked monthly. Hyperglycemia improved with insulin in TPN. Pt up ~25lbs since admission; PA aware. Will check routine vitamin labs every 3 months as pt on chronic TPN (next due 7/11). Pt continues to have  poor oral intake; pt eating <25% of meals. [name] pouch in place with + unmeasured output. Oozing is noted from eakin.  No discharge plan in place.   Medications reviewed and include: insulin, synthroid, protonix, vitamin A, TPN  -Selenium 81(L), zinc 44 wnl, -Chromium- 2.0 wnl, copper- 109 wnl, Manganese- 17.9 wnl, vitamin D 74.99 wnl, Iodine 70 wnl, aluminum 10(H)- 4/12 -Iron 25(L), TIBC 398, ferritin 46 wnl, folate 29.0 wnl, B12 922(H)- 4/12 -Vitamin B1- 73.2 wnl, vitamin A- 11.2(L), B6- 8.9 wnl, vitamin C- 0.9 wnl, vitamin E- 12.6 wnl, vitamin K- 0.49 wnl- 4/12 -Carnitine 32.0 wnl- 4/12  Labs reviewed: Na 136 wnl, K 4.7 wnl, BUN 23(H), creat 0.42(L), P 2.7 wnl, Mg 2.0 wnl- 5/6 Triglycerides- 76- 5/6 Wbc- 3.2(L), Hgb 7.5(L), Hct 22.7(L)- 5/7 Cbgs- 130, 210 x 24 hrs  Diet Order:    Diet Order             DIET SOFT Fluid consistency: Thin  Diet effective now                  EDUCATION NEEDS:   Not appropriate for education at this time  Skin:  Skin Assessment: Reviewed RN Assessment (Midline wound with EC fistula)  Last BM:  5/7  Height:   Ht Readings from Last 1 Encounters:  12/19/22 4\' 11"  (1.499 m)    Weight:   Wt Readings from Last 1 Encounters:  01/13/23 100.7 kg    Ideal Body Weight:  44.3 kg  BMI:  Body mass index is 44.84 kg/m.  Estimated Nutritional Needs:   Kcal:  1800-2100kcal/day  Protein:  90-110g/d  Fluid:  1.4-1.6L/day  Betsey Holiday MS, RD, LDN Please refer to [facility] for RD and/or RD on-call/weekend/after hours pager

## 2023-01-13 NOTE — Progress Notes (Signed)
Dr Tonna Boehringer informed via phone call of pt findings. Orders are to keep observing and inform him if there is any active bleeding.

## 2023-01-13 NOTE — Progress Notes (Signed)
Pt noted to be bleeding from eakin pouch. 75 cc of clots removed and 50cc of blood suctioned. Pt voices no complaints, but remains pale.   On call provider informed. Awaiting orders/recommendations.

## 2023-01-13 NOTE — Plan of Care (Signed)
  Problem: Nutrition: Goal: Adequate nutrition will be maintained Outcome: Progressing   Problem: Pain Managment: Goal: General experience of comfort will improve Outcome: Progressing   

## 2023-01-14 LAB — GLUCOSE, CAPILLARY
Glucose-Capillary: 199 mg/dL — ABNORMAL HIGH (ref 70–99)
Glucose-Capillary: 207 mg/dL — ABNORMAL HIGH (ref 70–99)
Glucose-Capillary: 135 mg/dL — ABNORMAL HIGH (ref 70–99)
Glucose-Capillary: 173 mg/dL — ABNORMAL HIGH (ref 70–99)

## 2023-01-14 MED ORDER — ZINC CHLORIDE 1 MG/ML IV SOLN
INTRAVENOUS | Status: AC
  Filled 2023-01-14: qty 672

## 2023-01-14 NOTE — Progress Notes (Signed)
[facility]   Hospital Day(s): 335.   Post op day(s): 26 Days Post-Op.   Interval History:  Patient seen and examined No further bleeding overnight No new labs this AM No abdominal pain, fever, chills [name] output 450 ccs + unmeasured in last 24 hours; managing effluent independently On cyclic TPN + soft diet  Ambulating well; independent  Review of Systems:  Constitutional: denies fever, chills  HEENT: denies cough or congestion  Respiratory: denies any shortness of breath  Cardiovascular: denies chest pain or palpitations  Gastrointestinal: denied abdominal pain, denied N/V Genitourinary: denies burning with urination or urinary frequency Integumentary: + midline wound (healing; stable)  Vital signs in last 24 hours: [min-max] current  Temp:  [98 F (36.7 C)-98.3 F (36.8 C)] 98.3 F (36.8 C) (05/08 0341) Pulse Rate:  [70-84] 77 (05/08 0341) Resp:  [16-20] 20 (05/08 0341) BP: (99-117)/(56-75) 104/56 (05/08 0341) SpO2:  [96 %-100 %] 98 % (05/08 0341)     Height: 4\' 11"  (149.9 cm) Weight: 100.7 kg BMI (Calculated): 44.82   Intake/Output last 2 shifts:  05/07 0701 - 05/08 0700 In: 1435.8 [P.O.:600; I.V.:835.8] Out: 460 [Drains:10; Stool:450]   Physical Exam:  Constitutional: alert, cooperative and no distress  Respiratory: breathing non-labored at rest  Cardiovascular: regular rate and sinus rhythm  Gastrointestinal: Soft, non-tender, non-distended, no rebound/guarding. Integumentary: Midline wound healing by secondary intention, peritoneum closed; granulating and new skin growing over majority of wound now; there are three stomatized areas visible in the LUQ and LLQ portions of the wound. No active bleeding Chest: Port to the right chest; no further swelling or drainage; no evidence of infection   Labs:     Latest Ref Rng & Units 01/13/2023    6:33 AM 01/12/2023    8:33 AM 01/11/2023    4:20 AM  CBC  WBC 4.0 - 10.5 K/uL 3.2   3.2  2.6   Hemoglobin 12.0 - 15.0 g/dL 7.5  8.4  6.5   Hematocrit 36.0 - 46.0 % 22.7  25.5  20.7   Platelets 150 - 400 K/uL 80  81  77       Latest Ref Rng & Units 01/12/2023    8:33 AM 01/06/2023   10:11 AM 01/05/2023    7:32 AM  CMP  Glucose 70 - 99 mg/dL 409  811  914   BUN 6 - 20 mg/dL 23  10  19    Creatinine 0.44 - 1.00 mg/dL 7.82  9.56  2.13   Sodium 135 - 145 mmol/L 136  133  136   Potassium 3.5 - 5.1 mmol/L 4.7  3.8  3.9   Chloride 98 - 111 mmol/L 108  103  108   CO2 22 - 32 mmol/L 24  23  23    Calcium 8.9 - 10.3 mg/dL 7.8  8.2  8.2   Total Protein 6.5 - 8.1 g/dL 5.2  6.2  6.1   Total Bilirubin 0.3 - 1.2 mg/dL 0.6  1.1  0.7   Alkaline Phos 38 - 126 U/L 101  146  140   AST 15 - 41 U/L 35  59  68   ALT 0 - 44 U/L 23  42  42     Imaging studies: No new imaging studies this AM   Assessment/Plan:  57 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  - New 05/08: No acute changes   - Wound Care ([name] Pouch); Now on smaller [name] pouch; continues managing effluent independently. Intermittent oozing from time to time. Change as needed.    - Continue soft diet as tolerated  - Continue cyclic TPN; weekly nutritional labs - Appreciate dietary assistance - monitoring weight              - Monitor abdominal examination; on-going bowel function            - Pain control prn; antemetic prn            - Progressed with therapies; no longer any recommendations; ambulating well    - Discharge Planning: Continue to anticipate lengthy admission +/- possible transfer; no new progress regarding disposition unfortunately   All of the above findings and recommendations  were discussed with the patient, and the medical team, and all of patient's questions were answered to her expressed satisfaction.  -- [name], PA-C [facility] 01/14/2023, 7:28 AM M-F: 7am - 4pm

## 2023-01-14 NOTE — Progress Notes (Signed)
PHARMACY - TOTAL PARENTERAL NUTRITION CONSULT NOTE   Indication: Prolonged ileus  Patient Measurements: Height: 4\' 11"  (149.9 cm) Weight: 100.7 kg (222 lb 0.1 oz) IBW/kg (Calculated) : 43.2 TPN AdjBW (KG): 57.8 Body mass index is 44.84 kg/m.  Assessment: Debra [name] is a 57 y.o. female s/p laparotomy, excision of greater omental mass, abdominal wall reconstruction with [name], appendectomy, and placement of Prevena vac. New PICC was replaced 4/12.   Glucose / Insulin:  --No apparent history of diabetes --BG controlled on current regimen.  --rSSI 4x/day (0500, 1300, 1800, 2200) --SSI last 24h: 7 units  Electrolytes: Na 136, K 4.7, Cl 108, Phos 2.7, Mg 2.0, Ca 7.8 Triglycerides = 76 Renal: SCr stable at baseline  Hepatic: AST elevated at 59 (down from 68 on 4/29) GI Imaging: 9/11 CTAP: no new acute issues GI Surgeries / Procedures: s/p laparotomy, excision of greater omental mass, abdominal wall reconstruction with [name], appendectomy, and placement of Prevena vac 12/19/22 placement of right IJ port-a-catheter with dual reservoirs also s/p re-opening of laparotomy for repair of small bowel perforation   Central access: 02/15/22 TPN start date: 02/15/22  Nutritional Goals: Goal cyclic TPN over 16 hrs: cyclic TPN over 16 hrs: (provides 102 g of protein and 1811 kcals per day) total volume over 16hrs for calculations=1400 ml (1500 ml total with overfill)  RD Assessment:  Estimated Needs Total Energy Estimated Needs: 1800-2100kcal/day Total Protein Estimated Needs: 90-110g/d Total Fluid Estimated Needs: 1.4-1.6L/day  Current Nutrition:  Soft diet + nutritional supplements, not meeting PO needs  Plan: Transitioned to *Cyclic* TPN on 10/07/84.   to run over 16 hours per MD request. [name] at 2000 per discussion with dietician to allow patient time for walking in afternoon/evenings.  To run over 16 hours: -Start rate at 49 mL/hr for 1  hour. -Increase rate to 98 mL/hr for 13 hours.  -Decrease rate to 49 mL/hr for 1 hour. -Decrease rate to 25 mL/hr for 1 hour, then stop.  Plan:  See cyclic TPN rate above Nutritional Components Amino acids (using 15% Clinisol): 101 g Dextrose 19% = 266 g Lipids (using 20% SMOFlipids): 50.4 g kCal: 1817/24h  Electrolytes in TPN: Na 75 mEq/L, K 79mEq/L, Ca 1mEq/L, Mg 10 mEq/L, Phos 10 mmol/L  Continue ratio Cl:Ac 1:1  Continue trace elements, MVI, chromium, zinc and selenium per dietary recs  Continue CBG/SSI for Cyclic TPN:  -CBG 2 hrs after cyclic TPN start -CBG during middle of cyclic TPN infusion -CBG 1 hr after cylic TPN stopped -CBG while off TPN Continue SSI insulin at moderate scale and remove insulin from TPN bag starting 01/06/23 due to low BG Check TPN labs on Mondays (CMP, phos, Mg, trig)   Elliot Gurney, PharmD, BCPS Clinical Pharmacist  01/14/2023 7:15 AM

## 2023-01-15 LAB — GLUCOSE, CAPILLARY
Glucose-Capillary: 196 mg/dL — ABNORMAL HIGH (ref 70–99)
Glucose-Capillary: 136 mg/dL — ABNORMAL HIGH (ref 70–99)
Glucose-Capillary: 109 mg/dL — ABNORMAL HIGH (ref 70–99)
Glucose-Capillary: 106 mg/dL — ABNORMAL HIGH (ref 70–99)
Glucose-Capillary: 128 mg/dL — ABNORMAL HIGH (ref 70–99)

## 2023-01-15 MED ORDER — ZINC CHLORIDE 1 MG/ML IV SOLN
INTRAVENOUS | Status: AC
  Filled 2023-01-15: qty 672

## 2023-01-15 NOTE — Progress Notes (Signed)
PHARMACY - TOTAL PARENTERAL NUTRITION CONSULT NOTE   Indication: Prolonged ileus  Patient Measurements: Height: 4\' 11"  (149.9 cm) Weight: 100.7 kg (222 lb 0.1 oz) IBW/kg (Calculated) : 43.2 TPN AdjBW (KG): 57.8 Body mass index is 44.84 kg/m.  Assessment: Debra [name] is a 57 y.o. female s/p laparotomy, excision of greater omental mass, abdominal wall reconstruction with [name], appendectomy, and placement of Prevena vac. New PICC was replaced 4/12.   Glucose / Insulin:  --No apparent history of diabetes --BG controlled on current regimen 5/7-5/8. (BG trends up to low 200's in evenings - continue to monitor)  --mSSI 4x/day (0500, 1300, 1800, 2200) --SSI last 24h: 13 units Electrolytes: Na 136, K 4.7, Cl 108, Phos 2.7, Mg 2.0, Ca 7.8 Triglycerides = 76 Renal: SCr stable at baseline  Hepatic: AST elevated at 59 (down from 68 on 4/29) GI Imaging: 9/11 CTAP: no new acute issues GI Surgeries / Procedures: s/p laparotomy, excision of greater omental mass, abdominal wall reconstruction with [name], appendectomy, and placement of Prevena vac 12/19/22 placement of right IJ port-a-catheter with dual reservoirs also s/p re-opening of laparotomy for repair of small bowel perforation   Central access: 02/15/22 TPN start date: 02/15/22  Nutritional Goals: Goal cyclic TPN over 16 hrs: cyclic TPN over 16 hrs: (provides 102 g of protein and 1811 kcals per day) total volume over 16hrs for calculations=1400 ml (1500 ml total with overfill)  RD Assessment:  Estimated Needs Total Energy Estimated Needs: 1800-2100kcal/day Total Protein Estimated Needs: 90-110g/d Total Fluid Estimated Needs: 1.4-1.6L/day  Current Nutrition:  Soft diet + nutritional supplements, not meeting PO needs  Plan: Transitioned to *Cyclic* TPN on 1/61/09.   to run over 16 hours per MD request. [name] at 2000 per discussion with dietician to allow patient time for walking in  afternoon/evenings.  To run over 16 hours: -Start rate at 49 mL/hr for 1 hour. -Increase rate to 98 mL/hr for 13 hours.  -Decrease rate to 49 mL/hr for 1 hour. -Decrease rate to 25 mL/hr for 1 hour, then stop.  Plan:  See cyclic TPN rate above Nutritional Components Amino acids (using 15% Clinisol): 101 g Dextrose 19% = 266 g Lipids (using 20% SMOFlipids): 50.4 g kCal: 1817/24h  Electrolytes in TPN: Na 75 mEq/L, K 59mEq/L, Ca 32mEq/L, Mg 10 mEq/L, Phos 10 mmol/L  Continue ratio Cl:Ac 1:1  Continue trace elements, MVI, chromium, zinc and selenium per dietary recs  Continue CBG/SSI for Cyclic TPN:  -CBG 2 hrs after cyclic TPN start -CBG during middle of cyclic TPN infusion -CBG 1 hr after cylic TPN stopped -CBG while off TPN Continue SSI insulin at moderate scale and remove insulin from TPN bag starting 01/06/23 due to low BG Check TPN labs on Mondays (CMP, phos, Mg, trig)   Elliot Gurney, PharmD, BCPS Clinical Pharmacist  01/15/2023 7:34 AM

## 2023-01-15 NOTE — Progress Notes (Signed)
[facility] PRO[facility]ESS NOTE   Hospital Day(s): 336.   Post op day(s): 27 Days Post-Op.   Interval History:  Patient seen and examined No issues overnight Stable serosanguinous/feculent [name] output  No new labs this AM No abdominal pain, fever, chills [name] output 600 ccs + unmeasured in last 24 hours; managing effluent independently On cyclic TPN + soft diet  Ambulating well; independent  Review of Systems:  Constitutional: denies fever, chills  HEENT: denies cough or congestion  Respiratory: denies any shortness of breath  Cardiovascular: denies chest pain or palpitations  Gastrointestinal: denied abdominal pain, denied N/V Genitourinary: denies burning with urination or urinary frequency Integumentary: + midline wound (healing; stable)  Vital signs in last 24 hours: [min-max] current  Temp:  [98.2 F (36.8 C)-98.3 F (36.8 C)] 98.3 F (36.8 C) (05/09 0407) Pulse Rate:  [78-95] 95 (05/09 0407) Resp:  [20] 20 (05/09 0407) BP: (100-122)/(56-84) 122/65 (05/09 0407) SpO2:  [84 %-100 %] 98 % (05/09 0407) Weight:  [100.7 kg] 100.7 kg (05/09 0500)     Height: 4\' 11"  (149.9 cm) Weight: 100.7 kg BMI (Calculated): 44.82   Intake/Output last 2 shifts:  05/08 0701 - 05/09 0700 In: 1057.6 [P.O.:240; I.V.:817.6] Out: 762 [Urine:2; Drains:160; Stool:600]   Physical Exam:  Constitutional: alert, cooperative and no distress  Respiratory: breathing non-labored at rest  Cardiovascular: regular rate and sinus rhythm  Gastrointestinal: Soft, non-tender, non-distended, no rebound/guarding. Integumentary: Midline wound healing by secondary intention, peritoneum closed; granulating and new skin growing over majority of wound now; there are three stomatized areas visible in the LUQ and LLQ portions of the wound. No active bleeding Chest: Port to the right chest; no further swelling or drainage; no evidence of infection   Labs:     Latest Ref Rng & Units 01/13/2023     6:33 AM 01/12/2023    8:33 AM 01/11/2023    4:20 AM  CBC  WBC 4.0 - 10.5 K/uL 3.2  3.2  2.6   Hemoglobin 12.0 - 15.0 g/dL 7.5  8.4  6.5   Hematocrit 36.0 - 46.0 % 22.7  25.5  20.7   Platelets 150 - 400 K/uL 80  81  77       Latest Ref Rng & Units 01/12/2023    8:33 AM 01/06/2023   10:11 AM 01/05/2023    7:32 AM  CMP  Glucose 70 - 99 mg/dL 846  962  952   BUN 6 - 20 mg/dL 23  10  19    Creatinine 0.44 - 1.00 mg/dL 8.41  3.24  4.01   Sodium 135 - 145 mmol/L 136  133  136   Potassium 3.5 - 5.1 mmol/L 4.7  3.8  3.9   Chloride 98 - 111 mmol/L 108  103  108   CO2 22 - 32 mmol/L 24  23  23    Calcium 8.9 - 10.3 mg/dL 7.8  8.2  8.2   Total Protein 6.5 - 8.1 g/dL 5.2  6.2  6.1   Total Bilirubin 0.3 - 1.2 mg/dL 0.6  1.1  0.7   Alkaline Phos 38 - 126 U/L 101  146  140   AST 15 - 41 U/L 35  59  68   ALT 0 - 44 U/L 23  42  42     Imaging studies: No new imaging studies this AM   Assessment/Plan:  57 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  - New 05/09: No acute changes this morning  - Wound Care ([name] Pouch); Now on smaller [name] pouch; continues managing effluent independently. Intermittent oozing from time to time. Change as needed.    - Continue soft diet as tolerated  - Continue cyclic TPN; weekly nutritional labs - Appreciate dietary assistance - monitoring weight              - Monitor abdominal examination; on-going bowel function            - Pain control prn; antemetic prn            - Progressed with therapies; no longer any recommendations; ambulating well    - Discharge Planning: Continue to anticipate lengthy admission +/- possible transfer; no  new progress regarding disposition unfortunately   All of the above findings and recommendations were discussed with the patient, and the medical team, and all of patient's questions were answered to her expressed satisfaction.  -- [name], PA-C [facility] 01/15/2023, 8:02 AM M-F: 7am - 4pm

## 2023-01-15 NOTE — Plan of Care (Signed)
  Problem: Activity: Goal: Risk for activity intolerance will decrease Outcome: Progressing   Problem: Nutrition: Goal: Adequate nutrition will be maintained Outcome: Progressing   Problem: Coping: Goal: Level of anxiety will decrease Outcome: Progressing   Problem: Pain Managment: Goal: General experience of comfort will improve Outcome: Progressing   

## 2023-01-15 NOTE — Plan of Care (Signed)
  Problem: Clinical Measurements: Goal: Will remain free from infection Outcome: Progressing   Problem: Nutrition: Goal: Adequate nutrition will be maintained Outcome: Progressing   Problem: Activity: Goal: Risk for activity intolerance will decrease Outcome: Progressing   Problem: Nutrition: Goal: Adequate nutrition will be maintained Outcome: Progressing   Problem: Coping: Goal: Level of anxiety will decrease Outcome: Progressing   Problem: Pain Managment: Goal: General experience of comfort will improve Outcome: Progressing   

## 2023-01-15 NOTE — Progress Notes (Signed)
Eakins pouch remains intact with small amounts serous/bloody drainage. Fecal material present with very small amounts removed earlier in the night. Larger amount now present in pouch. Minimal serous/bloody drainage.

## 2023-01-16 LAB — GLUCOSE, CAPILLARY
Glucose-Capillary: 196 mg/dL — ABNORMAL HIGH (ref 70–99)
Glucose-Capillary: 149 mg/dL — ABNORMAL HIGH (ref 70–99)

## 2023-01-16 MED ORDER — ZINC CHLORIDE 1 MG/ML IV SOLN
INTRAVENOUS | Status: AC
  Filled 2023-01-16: qty 672

## 2023-01-16 NOTE — Progress Notes (Signed)
[facility] SUR[facility]ICAL PROGRESS NOTE   Hospital Day(s): 337.   Post op day(s): 28 Days Post-Op.   Interval History:  Patient seen and examined No issues overnight No new labs this AM; weekly labs on Mondays No abdominal pain, fever, chills [name] output 400 ccs + unmeasured in last 24 hours; managing effluent independently On cyclic TPN + soft diet  Ambulating well; independent  Review of Systems:  Constitutional: denies fever, chills  HEENT: denies cough or congestion  Respiratory: denies any shortness of breath  Cardiovascular: denies chest pain or palpitations  Gastrointestinal: denied abdominal pain, denied N/V Genitourinary: denies burning with urination or urinary frequency Integumentary: + midline wound (healing; stable)  Vital signs in last 24 hours: [min-max] current  Temp:  [97.8 F (36.6 C)-98.3 F (36.8 C)] 98.3 F (36.8 C) (05/10 0433) Pulse Rate:  [66-80] 77 (05/10 0433) Resp:  [18] 18 (05/10 0433) BP: (93-124)/(45-68) 93/45 (05/10 0433) SpO2:  [94 %-99 %] 97 % (05/10 0433) Weight:  [102.8 kg] 102.8 kg (05/10 0200)     Height: 4\' 11"  (149.9 cm) Weight: 102.8 kg BMI (Calculated): 45.75   Intake/Output last 2 shifts:  05/09 0701 - 05/10 0700 In: 1332.7 [P.O.:420; I.V.:912.7] Out: 400 [Drains:300; Stool:100]   Physical Exam:  Constitutional: alert, cooperative and no distress  Respiratory: breathing non-labored at rest  Cardiovascular: regular rate and sinus rhythm  Gastrointestinal: Soft, non-tender, non-distended, no rebound/guarding. Integumentary: Midline wound healing by secondary intention, peritoneum closed; granulating and new skin growing over majority of wound now; there are three stomatized areas visible in the LUQ and LLQ portions of the wound. No active bleeding Chest: Port to the right chest; no further swelling or drainage; no evidence of infection   Labs:     Latest Ref Rng & Units 01/13/2023    6:33 AM 01/12/2023    8:33 AM  01/11/2023    4:20 AM  CBC  WBC 4.0 - 10.5 K/uL 3.2  3.2  2.6   Hemoglobin 12.0 - 15.0 g/dL 7.5  8.4  6.5   Hematocrit 36.0 - 46.0 % 22.7  25.5  20.7   Platelets 150 - 400 K/uL 80  81  77       Latest Ref Rng & Units 01/12/2023    8:33 AM 01/06/2023   10:11 AM 01/05/2023    7:32 AM  CMP  Glucose 70 - 99 mg/dL 829  562  130   BUN 6 - 20 mg/dL 23  10  19    Creatinine 0.44 - 1.00 mg/dL 8.65  7.84  6.96   Sodium 135 - 145 mmol/L 136  133  136   Potassium 3.5 - 5.1 mmol/L 4.7  3.8  3.9   Chloride 98 - 111 mmol/L 108  103  108   CO2 22 - 32 mmol/L 24  23  23    Calcium 8.9 - 10.3 mg/dL 7.8  8.2  8.2   Total Protein 6.5 - 8.1 g/dL 5.2  6.2  6.1   Total Bilirubin 0.3 - 1.2 mg/dL 0.6  1.1  0.7   Alkaline Phos 38 - 126 U/L 101  146  140   AST 15 - 41 U/L 35  59  68   ALT 0 - 44 U/L 23  42  42     Imaging studies: No new imaging studies this AM   Assessment/Plan:  57 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  - New 05/10: No acute changes this morning  - Wound Care ([name] Pouch); Now on smaller [name] pouch; continues managing effluent independently. Intermittent oozing from time to time. Change as needed.    - Continue soft diet as tolerated  - Continue cyclic TPN; weekly nutritional labs - Appreciate dietary assistance - monitoring weight              - Monitor abdominal examination; on-going bowel function            - Pain control prn; antemetic prn            - Progressed with therapies; no longer any recommendations; ambulating well    - Discharge Planning: Continue to anticipate lengthy admission +/- possible transfer; no new progress regarding  disposition unfortunately   All of the above findings and recommendations were discussed with the patient, and the medical team, and all of patient's questions were answered to her expressed satisfaction.  -- [name], PA-C [facility] 01/16/2023, 8:22 AM M-F: 7am - 4pm

## 2023-01-16 NOTE — Plan of Care (Signed)
  Problem: Nutrition: Goal: Adequate nutrition will be maintained Outcome: Progressing   Problem: Activity: Goal: Risk for activity intolerance will decrease Outcome: Progressing   Problem: Nutrition: Goal: Adequate nutrition will be maintained Outcome: Progressing   Problem: Coping: Goal: Level of anxiety will decrease Outcome: Progressing   

## 2023-01-16 NOTE — Plan of Care (Signed)
  Problem: Clinical Measurements: Goal: Will remain free from infection Outcome: Progressing   Problem: Nutrition: Goal: Adequate nutrition will be maintained Outcome: Progressing   Problem: Activity: Goal: Risk for activity intolerance will decrease Outcome: Progressing   Problem: Nutrition: Goal: Adequate nutrition will be maintained Outcome: Progressing   Problem: Coping: Goal: Level of anxiety will decrease Outcome: Progressing   Problem: Pain Managment: Goal: General experience of comfort will improve Outcome: Progressing   

## 2023-01-17 MED ORDER — STERILE WATER FOR INJECTION IV SOLN
INTRAVENOUS | Status: AC
  Filled 2023-01-17: qty 672

## 2023-01-17 NOTE — Progress Notes (Addendum)
2000 Central line assessed R Chest [place]. Pt is receiving TPN. Dressing is clean and intact. CHG wipes used before and after contact, flushed, and blood return is noted. CHG indicated and will be performed at 0600 in AM. RN will be sure it is documented in MAR.   2200 CBG checked before starting TPN. CBG 87. Vitals updated in [facility].   2305 Rate change on TPN. NAD. Will recheck CBG at 2 hours into treatment at 0000. Pt denies fever or chills.   [name] pouch emptied with 125 ml of yellowish output.    0000 CBG 133 0400 CBG 188 0500 3 units of SSI given Pt tolerating TPN without issues or complaints.  BP is soft 97/55. PO intake encouraged.

## 2023-01-17 NOTE — Progress Notes (Signed)
PHARMACY - TOTAL PARENTERAL NUTRITION CONSULT NOTE   Indication: Prolonged ileus  Patient Measurements: Height: 4\' 11"  (149.9 cm) Weight: 103.4 kg (227 lb 15.3 oz) IBW/kg (Calculated) : 43.2 TPN AdjBW (KG): 57.8 Body mass index is 46.04 kg/m.  Assessment: Debra [name] is a 57 y.o. female s/p laparotomy, excision of greater omental mass, abdominal wall reconstruction with [name], appendectomy, and placement of Prevena vac. New PICC was replaced 4/12.   Glucose / Insulin:  --No apparent history of diabetes --BG controlled on current regimen 5/7-5/8. (BG trends up to low 200's in evenings - continue to monitor)  --mSSI 4x/day (0500, 1300, 1800, 2200) --SSI last 24h: 6  units Electrolytes: WNL on last check Triglycerides WNL Renal: SCr stable at baseline  Hepatic: LFTs, bilirubin wnl GI Imaging: 9/11 CTAP: no new acute issues GI Surgeries / Procedures: s/p laparotomy, excision of greater omental mass, abdominal wall reconstruction with [name], appendectomy, and placement of Prevena vac 12/19/22 placement of right IJ port-a-catheter with dual reservoirs also s/p re-opening of laparotomy for repair of small bowel perforation   Central access: 02/15/22 TPN start date: 02/15/22  Nutritional Goals: Goal cyclic TPN over 16 hrs: cyclic TPN over 16 hrs: (provides 102 g of protein and 1811 kcals per day) total volume over 16hrs for calculations=1400 ml (1500 ml total with overfill)  RD Assessment:  Estimated Needs Total Energy Estimated Needs: 1800-2100kcal/day Total Protein Estimated Needs: 90-110g/d Total Fluid Estimated Needs: 1.4-1.6L/day  Current Nutrition:  Soft diet + nutritional supplements, not meeting PO needs  Plan: Transitioned to *Cyclic* TPN on 1/61/09.   to run over 16 hours per MD request. [name] at 2000 per discussion with dietician to allow patient time for walking in afternoon/evenings.  To run over 16 hours: -Start rate at 49  mL/hr for 1 hour. -Increase rate to 98 mL/hr for 13 hours.  -Decrease rate to 49 mL/hr for 1 hour. -Decrease rate to 25 mL/hr for 1 hour, then stop.  Plan:  See cyclic TPN rate above Nutritional Components Amino acids (using 15% Clinisol): 101 g Dextrose 19% = 266 g Lipids (using 20% SMOFlipids): 50.4 g kCal: 1817/24h  Electrolytes in TPN: Na 75 mEq/L, K 71mEq/L, Ca 50mEq/L, Mg 10 mEq/L, Phos 10 mmol/L  Continue ratio Cl:Ac 1:1  remove trace elements, MVI, chromium, zinc and selenium per dietary recs  Continue CBG/SSI for Cyclic TPN:  -CBG 2 hrs after cyclic TPN start -CBG during middle of cyclic TPN infusion -CBG 1 hr after cylic TPN stopped -CBG while off TPN Continue SSI insulin at moderate scale and remove insulin from TPN bag starting 01/06/23 due to low BG Check TPN labs on Mondays (CMP, phos, Mg, trig)   Burnis Medin, PharmD, BCPS Clinical Pharmacist  01/17/2023 9:14 AM

## 2023-01-18 LAB — GLUCOSE, CAPILLARY
Glucose-Capillary: 108 mg/dL — ABNORMAL HIGH (ref 70–99)
Glucose-Capillary: 176 mg/dL — ABNORMAL HIGH (ref 70–99)

## 2023-01-18 MED ORDER — STERILE WATER FOR INJECTION IV SOLN
INTRAVENOUS | Status: AC
  Filled 2023-01-18: qty 672

## 2023-01-18 NOTE — Progress Notes (Addendum)
Blood sugar one hour after TPN was 111

## 2023-01-18 NOTE — Progress Notes (Signed)
Blood sugar 163

## 2023-01-18 NOTE — Progress Notes (Signed)
PHARMACY - TOTAL PARENTERAL NUTRITION CONSULT NOTE   Indication: Prolonged ileus  Patient Measurements: Height: 4\' 11"  (149.9 cm) Weight: 103.4 kg (227 lb 15.3 oz) IBW/kg (Calculated) : 43.2 TPN AdjBW (KG): 57.8 Body mass index is 46.04 kg/m.  Assessment: Debra [name] is a 57 y.o. female s/p laparotomy, excision of greater omental mass, abdominal wall reconstruction with [name], appendectomy, and placement of Prevena vac. New PICC was replaced 4/12.   Glucose / Insulin:  --No apparent history of diabetes --BG controlled on current regimen 5/7-5/8. (BG trends up to low 200's in evenings/overnight while on TPN - continue to monitor)  --mSSI 4x/day (0500, 1300, 1800, 2200) --SSI last 24h: 7  units Electrolytes: WNL on last check Triglycerides WNL Renal: SCr stable at baseline  Hepatic: LFTs, bilirubin wnl GI Imaging: 9/11 CTAP: no new acute issues GI Surgeries / Procedures: s/p laparotomy, excision of greater omental mass, abdominal wall reconstruction with [name], appendectomy, and placement of Prevena vac 12/19/22 placement of right IJ port-a-catheter with dual reservoirs also s/p re-opening of laparotomy for repair of small bowel perforation   Central access: 02/15/22 TPN start date: 02/15/22  Nutritional Goals: Goal cyclic TPN over 16 hrs: cyclic TPN over 16 hrs: (provides 102 g of protein and 1811 kcals per day) total volume over 16hrs for calculations=1400 ml (1500 ml total with overfill)  RD Assessment:  Estimated Needs Total Energy Estimated Needs: 1800-2100kcal/day Total Protein Estimated Needs: 90-110g/d Total Fluid Estimated Needs: 1.4-1.6L/day  Current Nutrition:  Soft diet + nutritional supplements, not meeting PO needs  Plan: Transitioned to *Cyclic* TPN on 1/61/09.   to run over 16 hours per MD request. [name] at 2000 per discussion with dietician to allow patient time for walking in afternoon/evenings.  To run over 16  hours: -Start rate at 49 mL/hr for 1 hour. -Increase rate to 98 mL/hr for 13 hours.  -Decrease rate to 49 mL/hr for 1 hour. -Decrease rate to 25 mL/hr for 1 hour, then stop.  Plan:  See cyclic TPN rate above Nutritional Components Amino acids (using 15% Clinisol): 101 g Dextrose 19% = 266 g Lipids (using 20% SMOFlipids): 50.4 g kCal: 1817/24h  Electrolytes in TPN: Na 75 mEq/L, K 40mEq/L, Ca 97mEq/L, Mg 10 mEq/L, Phos 10 mmol/L  Continue ratio Cl:Ac 1:1  remove trace elements, MVI, chromium, zinc and selenium per dietary recs  Continue CBG/SSI for Cyclic TPN:  -CBG 2 hrs after cyclic TPN start -CBG during middle of cyclic TPN infusion -CBG 1 hr after cylic TPN stopped -CBG while off TPN Continue SSI insulin at moderate scale and remove insulin from TPN bag starting 01/06/23 due to low BG Check TPN labs on Mondays (CMP, phos, Mg, trig)   Bari Mantis PharmD Clinical Pharmacist 01/18/2023

## 2023-01-18 NOTE — Progress Notes (Signed)
Subjective:  CC: Debra [name] is a 56 y.o. female  Hospital stay day 339, 30 Days Post-Op placement of right IJ port-a-catheter with dual reservoirs also s/p re-opening of laparotomy for repair of small bowel perforation following initial laparotomy, excision of greater omental mass, abdominal wall reconstruction with [name], appendectomy, and placement of Prevena vac on 06/08.   HPI: No acute complaints overnight.  ROS:  General: Denies weight loss, weight gain, fatigue, fevers, chills, and night sweats. Heart: Denies chest pain, palpitations, racing heart, irregular heartbeat, leg pain or swelling, and decreased activity tolerance. Respiratory: Denies breathing difficulty, shortness of breath, wheezing, cough, and sputum. GI: Denies change in appetite, heartburn, nausea, vomiting, constipation, diarrhea, and blood in stool. GU: Denies difficulty urinating, pain with urinating, urgency, frequency, blood in urine.   Objective:   Temp:  [98.2 F (36.8 C)-99.3 F (37.4 C)] 98.8 F (37.1 C) (05/12 0807) Pulse Rate:  [72-90] 79 (05/12 0807) Resp:  [16-20] 18 (05/12 0807) BP: (94-116)/(49-94) 94/49 (05/12 0807) SpO2:  [96 %-100 %] 97 % (05/12 0807) Weight:  [103.4 kg] 103.4 kg (05/12 0500)     Height: 4\' 11"  (149.9 cm) Weight: 103.4 kg BMI (Calculated): 46.02   Intake/Output this shift:   Intake/Output Summary (Last 24 hours) at 01/18/2023 1224 Last data filed at 01/18/2023 0335 Gross per 24 hour  Intake 474.38 ml  Output 250 ml  Net 224.38 ml    Constitutional :  alert, cooperative, appears stated age, and no distress  Respiratory:  clear to auscultation bilaterally  Cardiovascular:  regular rate and rhythm  Gastrointestinal: soft, non-tender; bowel sounds normal; no masses,  no organomegaly.   Skin: Cool and moist.  [name]'s pouch intact.  Fistula openings with no signs of active bleeding  Psychiatric: Normal affect, non-agitated, not confused       LABS:      Latest Ref Rng & Units 01/12/2023    8:33 AM 01/06/2023   10:11 AM 01/05/2023    7:32 AM  CMP  Glucose 70 - 99 mg/dL 161  096  045   BUN 6 - 20 mg/dL 23  10  19    Creatinine 0.44 - 1.00 mg/dL 4.09  8.11  9.14   Sodium 135 - 145 mmol/L 136  133  136   Potassium 3.5 - 5.1 mmol/L 4.7  3.8  3.9   Chloride 98 - 111 mmol/L 108  103  108   CO2 22 - 32 mmol/L 24  23  23    Calcium 8.9 - 10.3 mg/dL 7.8  8.2  8.2   Total Protein 6.5 - 8.1 g/dL 5.2  6.2  6.1   Total Bilirubin 0.3 - 1.2 mg/dL 0.6  1.1  0.7   Alkaline Phos 38 - 126 U/L 101  146  140   AST 15 - 41 U/L 35  59  68   ALT 0 - 44 U/L 23  42  42       Latest Ref Rng & Units 01/13/2023    6:33 AM 01/12/2023    8:33 AM 01/11/2023    4:20 AM  CBC  WBC 4.0 - 10.5 K/uL 3.2  3.2  2.6   Hemoglobin 12.0 - 15.0 g/dL 7.5  8.4  6.5   Hematocrit 36.0 - 46.0 % 22.7  25.5  20.7   Platelets 150 - 400 K/uL 80  81  77     RADS: N/a Assessment:   S/p placement of right IJ port-a-catheter with dual reservoirs  also s/p re-opening of laparotomy for repair of small bowel perforation following initial laparotomy, excision of greater omental mass, abdominal wall reconstruction with [name], appendectomy, and placement of Prevena vac on 06/08.   Stable.  Continue current management.  labs/images/medications/previous chart entries reviewed personally and relevant changes/updates noted above.

## 2023-01-19 LAB — CBC
HCT: 23.5 % — ABNORMAL LOW (ref 36.0–46.0)
Hemoglobin: 7.4 g/dL — ABNORMAL LOW (ref 12.0–15.0)
MCH: 27.6 pg (ref 26.0–34.0)
MCHC: 31.5 g/dL (ref 30.0–36.0)
MCV: 87.7 fL (ref 80.0–100.0)
Platelets: 83 10*3/uL — ABNORMAL LOW (ref 150–400)
RBC: 2.68 MIL/uL — ABNORMAL LOW (ref 3.87–5.11)
RDW: 14.6 % (ref 11.5–15.5)
WBC: 2.8 10*3/uL — ABNORMAL LOW (ref 4.0–10.5)
nRBC: 0 % (ref 0.0–0.2)

## 2023-01-19 LAB — COMPREHENSIVE METABOLIC PANEL
ALT: 30 U/L (ref 0–44)
AST: 44 U/L — ABNORMAL HIGH (ref 15–41)
Albumin: 2.5 g/dL — ABNORMAL LOW (ref 3.5–5.0)
Alkaline Phosphatase: 122 U/L (ref 38–126)
Anion gap: 4 — ABNORMAL LOW (ref 5–15)
BUN: 18 mg/dL (ref 6–20)
CO2: 25 mmol/L (ref 22–32)
Calcium: 8.1 mg/dL — ABNORMAL LOW (ref 8.9–10.3)
Chloride: 107 mmol/L (ref 98–111)
Creatinine, Ser: 0.47 mg/dL (ref 0.44–1.00)
GFR, Estimated: >60 mL/min (ref >60)
Glucose, Bld: 162 mg/dL — ABNORMAL HIGH (ref 70–99)
Potassium: 3.7 mmol/L (ref 3.5–5.1)
Sodium: 136 mmol/L (ref 135–145)
Total Bilirubin: 0.6 mg/dL (ref 0.3–1.2)
Total Protein: 5.8 g/dL — ABNORMAL LOW (ref 6.5–8.1)

## 2023-01-19 LAB — FERRITIN: Ferritin: 14 ng/mL (ref 11–307)

## 2023-01-19 LAB — GLUCOSE, CAPILLARY
Glucose-Capillary: 100 mg/dL — ABNORMAL HIGH (ref 70–99)
Glucose-Capillary: 103 mg/dL — ABNORMAL HIGH (ref 70–99)
Glucose-Capillary: 111 mg/dL — ABNORMAL HIGH (ref 70–99)
Glucose-Capillary: 121 mg/dL — ABNORMAL HIGH (ref 70–99)
Glucose-Capillary: 133 mg/dL — ABNORMAL HIGH (ref 70–99)
Glucose-Capillary: 153 mg/dL — ABNORMAL HIGH (ref 70–99)
Glucose-Capillary: 157 mg/dL — ABNORMAL HIGH (ref 70–99)
Glucose-Capillary: 160 mg/dL — ABNORMAL HIGH (ref 70–99)
Glucose-Capillary: 165 mg/dL — ABNORMAL HIGH (ref 70–99)
Glucose-Capillary: 170 mg/dL — ABNORMAL HIGH (ref 70–99)
Glucose-Capillary: 171 mg/dL — ABNORMAL HIGH (ref 70–99)
Glucose-Capillary: 171 mg/dL — ABNORMAL HIGH (ref 70–99)
Glucose-Capillary: 176 mg/dL — ABNORMAL HIGH (ref 70–99)
Glucose-Capillary: 178 mg/dL — ABNORMAL HIGH (ref 70–99)
Glucose-Capillary: 188 mg/dL — ABNORMAL HIGH (ref 70–99)
Glucose-Capillary: 87 mg/dL (ref 70–99)
Glucose-Capillary: 161 mg/dL — ABNORMAL HIGH (ref 70–99)
Glucose-Capillary: 94 mg/dL (ref 70–99)
Glucose-Capillary: 142 mg/dL — ABNORMAL HIGH (ref 70–99)

## 2023-01-19 LAB — MAGNESIUM: Magnesium: 2.1 mg/dL (ref 1.7–2.4)

## 2023-01-19 LAB — PHOSPHORUS: Phosphorus: 3.1 mg/dL (ref 2.5–4.6)

## 2023-01-19 MED ORDER — STERILE WATER FOR INJECTION IV SOLN
INTRAVENOUS | Status: AC
  Filled 2023-01-19: qty 672

## 2023-01-19 NOTE — Progress Notes (Signed)
[facility]ESS [facility]   Hospital Day(s): 340.   Post op day(s): 31 Days Post-Op.   Interval History:  Patient seen and examined No issues overnight Persistent leukopenia; 2.8K Hgb to 7.4; stable in last week Labs otherwise reassuring No abdominal pain, fever, chills [name] output unmeasured in last 24 hours; managing effluent independently On cyclic TPN + soft diet  Ambulating well; independent  Review of Systems:  Constitutional: denies fever, chills  HEENT: denies cough or congestion  Respiratory: denies any shortness of breath  Cardiovascular: denies chest pain or palpitations  Gastrointestinal: denied abdominal pain, denied N/V Genitourinary: denies burning with urination or urinary frequency Integumentary: + midline wound (healing; stable)  Vital signs in last 24 hours: [min-max] current  Temp:  [98.2 F (36.8 C)-99.3 F (37.4 C)] 98.2 F (36.8 C) (05/13 0604) Pulse Rate:  [70-80] 77 (05/13 0604) Resp:  [18] 18 (05/12 0807) BP: (94-112)/(49-71) 100/50 (05/13 0604) SpO2:  [96 %-98 %] 97 % (05/13 0604)     Height: 4\' 11"  (149.9 cm) Weight: 103.4 kg BMI (Calculated): 46.02   Intake/Output last 2 shifts:  No intake/output data recorded.   Physical Exam:  Constitutional: alert, cooperative and no distress  Respiratory: breathing non-labored at rest  Cardiovascular: regular rate and sinus rhythm  Gastrointestinal: Soft, non-tender, non-distended, no rebound/guarding. Integumentary: Midline wound healing by secondary intention, peritoneum closed; granulating and new skin growing over majority of wound now; there are three stomatized areas visible in the LUQ and LLQ portions of the wound. No active bleeding Chest: Port to the right chest; no further swelling or drainage; no evidence of infection   Labs:     Latest Ref Rng & Units 01/19/2023    5:48 AM 01/13/2023    6:33 AM 01/12/2023    8:33 AM  CBC  WBC 4.0 - 10.5 K/uL 2.8  3.2  3.2    Hemoglobin 12.0 - 15.0 g/dL 7.4  7.5  8.4   Hematocrit 36.0 - 46.0 % 23.5  22.7  25.5   Platelets 150 - 400 K/uL 83  80  81       Latest Ref Rng & Units 01/12/2023    8:33 AM 01/06/2023   10:11 AM 01/05/2023    7:32 AM  CMP  Glucose 70 - 99 mg/dL 409  811  914   BUN 6 - 20 mg/dL 23  10  19    Creatinine 0.44 - 1.00 mg/dL 7.82  9.56  2.13   Sodium 135 - 145 mmol/L 136  133  136   Potassium 3.5 - 5.1 mmol/L 4.7  3.8  3.9   Chloride 98 - 111 mmol/L 108  103  108   CO2 22 - 32 mmol/L 24  23  23    Calcium 8.9 - 10.3 mg/dL 7.8  8.2  8.2   Total Protein 6.5 - 8.1 g/dL 5.2  6.2  6.1   Total Bilirubin 0.3 - 1.2 mg/dL 0.6  1.1  0.7   Alkaline Phos 38 - 126 U/L 101  146  140   AST 15 - 41 U/L 35  59  68   ALT 0 - 44 U/L 23  42  42     Imaging studies: No new imaging studies this AM   Assessment/Plan:  57 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  - New 05/13: No acute changes this morning; Hgb stable; nutritional labs reassuring   - Wound Care ([name] Pouch); Now on smaller [name] pouch; continues managing effluent independently. Intermittent oozing from time to time. Change as needed.    - Continue soft diet as tolerated  - Continue cyclic TPN; weekly nutritional labs - Appreciate dietary assistance - monitoring weight              - Monitor abdominal examination; on-going bowel function            - Pain control prn; antemetic prn            - Progressed with therapies; no longer any recommendations; ambulating well    - Discharge Planning: Continue to anticipate lengthy admission +/- possible transfer; no new progress regarding disposition unfortunately    All of the above findings and recommendations were discussed with the patient, and the medical team, and all of patient's questions were answered to her expressed satisfaction.  -- [name], PA-C [facility] 01/19/2023, 7:19 AM M-F: 7am - 4pm

## 2023-01-19 NOTE — Progress Notes (Signed)
PHARMACY - TOTAL PARENTERAL NUTRITION CONSULT NOTE   Indication: Prolonged ileus  Patient Measurements: Height: 4\' 11"  (149.9 cm) Weight: 103.4 kg (227 lb 15.3 oz) IBW/kg (Calculated) : 43.2 TPN AdjBW (KG): 57.8 Body mass index is 46.04 kg/m.  Assessment: Debra [name] is a 57 y.o. female s/p laparotomy, excision of greater omental mass, abdominal wall reconstruction with [name], appendectomy, and placement of Prevena vac. New PICC was replaced 4/12.   Glucose / Insulin:  --No apparent history of diabetes --BG controlled on current regimen --mSSI 4x/day (0500, 1300, 1800, 2200) --SSI last 24h: 9 units Electrolytes: WNL on last check Triglycerides WNL Renal: SCr stable at baseline  Hepatic: LFTs, bilirubin wnl GI Imaging: 9/11 CTAP: no new acute issues GI Surgeries / Procedures: s/p laparotomy, excision of greater omental mass, abdominal wall reconstruction with [name], appendectomy, and placement of Prevena vac 12/19/22 placement of right IJ port-a-catheter with dual reservoirs also s/p re-opening of laparotomy for repair of small bowel perforation   Central access: 02/15/22 TPN start date: 02/15/22  Nutritional Goals: Goal cyclic TPN over 16 hrs: cyclic TPN over 16 hrs: (provides 102 g of protein and 1811 kcals per day) total volume over 16hrs for calculations=1400 ml (1500 ml total with overfill)  RD Assessment:  Estimated Needs Total Energy Estimated Needs: 1800-2100kcal/day Total Protein Estimated Needs: 90-110g/d Total Fluid Estimated Needs: 1.4-1.6L/day  Current Nutrition:  Soft diet + nutritional supplements, not meeting PO needs  Plan: Transitioned to *Cyclic* TPN on 2/95/62.   to run over 16 hours per MD request. [name] at 2000 per discussion with dietician to allow patient time for walking in afternoon/evenings.  To run over 16 hours: -Start rate at 49 mL/hr for 1 hour. -Increase rate to 98 mL/hr for 13 hours.  -Decrease rate  to 49 mL/hr for 1 hour. -Decrease rate to 25 mL/hr for 1 hour, then stop.  Plan:  See cyclic TPN rate above Nutritional Components Amino acids (using 15% Clinisol): 101 g Dextrose 19% = 266 g Lipids (using 20% SMOFlipids): 50.4 g kCal: 1817/24h  Electrolytes in TPN: Na 75 mEq/L, K 62mEq/L, Ca 35mEq/L, Mg 10 mEq/L, Phos 10 mmol/L  Continue ratio Cl:Ac 1:1  remove trace elements, MVI, chromium, zinc and selenium per dietary recs  Continue CBG/SSI for Cyclic TPN:  -CBG 2 hrs after cyclic TPN start -CBG during middle of cyclic TPN infusion -CBG 1 hr after cylic TPN stopped -CBG while off TPN Continue SSI insulin at moderate scale and remove insulin from TPN bag starting 01/06/23 due to low BG. Will consider re-adding if insulin usage becomes >10 units over  Check TPN labs on Mondays (CMP, phos, Mg, trig)   Bettey Costa, PharmD Clinical Pharmacist 01/19/2023 8:13 AM

## 2023-01-20 LAB — GLUCOSE, CAPILLARY
Glucose-Capillary: 157 mg/dL — ABNORMAL HIGH (ref 70–99)
Glucose-Capillary: 116 mg/dL — ABNORMAL HIGH (ref 70–99)
Glucose-Capillary: 115 mg/dL — ABNORMAL HIGH (ref 70–99)

## 2023-01-20 LAB — MISC LABCORP TEST (SEND OUT)
LabCorp test name: 84255
Labcorp test code: 716910

## 2023-01-20 MED ORDER — SODIUM CHLORIDE 0.9 % IV SOLN
300.0000 mg | INTRAVENOUS | Status: AC
  Administered 2023-01-20 – 2023-01-22 (×3): 300 mg via INTRAVENOUS
  Filled 2023-01-20 (×3): qty 300

## 2023-01-20 MED ORDER — ZINC CHLORIDE 1 MG/ML IV SOLN
INTRAVENOUS | Status: AC
  Filled 2023-01-20: qty 672

## 2023-01-20 NOTE — Progress Notes (Signed)
PHARMACY - TOTAL PARENTERAL NUTRITION CONSULT NOTE   Indication: Prolonged ileus  Patient Measurements: Height: 4\' 11"  (149.9 cm) Weight: 104.7 kg (230 lb 13.2 oz) IBW/kg (Calculated) : 43.2 TPN AdjBW (KG): 57.8 Body mass index is 46.62 kg/m.  Assessment: Debra [name] is a 57 y.o. female s/p laparotomy, excision of greater omental mass, abdominal wall reconstruction with [name], appendectomy, and placement of Prevena vac. New PICC was replaced 4/12.   Glucose / Insulin:  --No apparent history of diabetes --BG controlled on current regimen --mSSI 4x/day (0500, 1300, 1800, 2200) --SSI last 24h: 7 units Electrolytes: WNL on last check Triglycerides WNL Renal: SCr stable at baseline  Hepatic: LFTs, bilirubin wnl GI Imaging: 9/11 CTAP: no new acute issues GI Surgeries / Procedures: s/p laparotomy, excision of greater omental mass, abdominal wall reconstruction with [name], appendectomy, and placement of Prevena vac 12/19/22 placement of right IJ port-a-catheter with dual reservoirs also s/p re-opening of laparotomy for repair of small bowel perforation   Central access: 02/15/22 TPN start date: 02/15/22  Nutritional Goals: Goal cyclic TPN over 16 hrs: cyclic TPN over 16 hrs: (provides 102 g of protein and 1811 kcals per day) total volume over 16hrs for calculations=1400 ml (1500 ml total with overfill)  RD Assessment:  Estimated Needs Total Energy Estimated Needs: 1800-2100kcal/day Total Protein Estimated Needs: 90-110g/d Total Fluid Estimated Needs: 1.4-1.6L/day  Current Nutrition:  Soft diet + nutritional supplements, not meeting PO needs  Plan: Transitioned to *Cyclic* TPN on 12/16/79.   to run over 16 hours per MD request. [name] at 2000 per discussion with dietician to allow patient time for walking in afternoon/evenings.  To run over 16 hours: -Start rate at 49 mL/hr for 1 hour. -Increase rate to 98 mL/hr for 13 hours.  -Decrease rate  to 49 mL/hr for 1 hour. -Decrease rate to 25 mL/hr for 1 hour, then stop.  Plan:  See cyclic TPN rate above Nutritional Components Amino acids (using 15% Clinisol): 101 g Dextrose 19% = 266 g Lipids (using 20% SMOFlipids): 50.4 g kCal: 1817/24h  Electrolytes in TPN: Na 75 mEq/L, K 46mEq/L, Ca 80mEq/L, Mg 10 mEq/L, Phos 10 mmol/L  Continue ratio Cl:Ac 1:1  Adding back trace elements, MVI, chromium, zinc and selenium per dietary recs  Continue CBG/SSI for Cyclic TPN:  -CBG 2 hrs after cyclic TPN start -CBG during middle of cyclic TPN infusion -CBG 1 hr after cylic TPN stopped -CBG while off TPN Continue SSI insulin at moderate scale and remove insulin from TPN bag starting 01/06/23 due to low BG. Will consider re-adding if insulin usage becomes >10 units over  Check TPN labs on Mondays (CMP, phos, Mg, trig)   Bettey Costa, PharmD Clinical Pharmacist 01/20/2023 8:53 AM

## 2023-01-20 NOTE — Progress Notes (Signed)
[facility] SURGICAL PROGRESS NOTE   Hospital Day(s): 341.   Post op day(s): 32 Days Post-Op.   Interval History:  Patient seen and examined No issues overnight Hemodynamically stable  No new labs this morning; drawn weekly  No abdominal pain, nausea, emesis, fever [name] output unmeasured in last 24 hours; managing effluent independently No bleeding  On cyclic TPN + soft diet  Ambulating well; independent  Review of Systems:  Constitutional: denies fever, chills  HEENT: denies cough or congestion  Respiratory: denies any shortness of breath  Cardiovascular: denies chest pain or palpitations  Gastrointestinal: denied abdominal pain, denied N/V Genitourinary: denies burning with urination or urinary frequency Integumentary: + midline wound (healing; stable)  Vital signs in last 24 hours: [min-max] current  Temp:  [98.3 F (36.8 C)-98.8 F (37.1 C)] 98.7 F (37.1 C) (05/14 0427) Pulse Rate:  [74-96] 96 (05/14 0427) Resp:  [16-20] 20 (05/14 0427) BP: (105-118)/(56-65) 118/61 (05/14 0427) SpO2:  [91 %-100 %] 96 % (05/14 0427) Weight:  [104.7 kg] 104.7 kg (05/14 0500)     Height: 4\' 11"  (149.9 cm) Weight: 104.7 kg BMI (Calculated): 46.02   Intake/Output last 2 shifts:  05/13 0701 - 05/14 0700 In: 2564.6 [P.O.:730; I.V.:1834.6] Out: 200 [Stool:200]   Physical Exam:  Constitutional: alert, cooperative and no distress  Respiratory: breathing non-labored at rest  Cardiovascular: regular rate and sinus rhythm  Gastrointestinal: Soft, non-tender, non-distended, no rebound/guarding. Integumentary: Midline wound healing by secondary intention, peritoneum closed; granulating and new skin growing over majority of wound now; there are three stomatized areas visible in the LUQ and LLQ portions of the wound. No active bleeding Chest: Port to the right chest; no further swelling or drainage; no evidence of infection   Labs:     Latest Ref Rng & Units 01/19/2023     5:48 AM 01/13/2023    6:33 AM 01/12/2023    8:33 AM  CBC  WBC 4.0 - 10.5 K/uL 2.8  3.2  3.2   Hemoglobin 12.0 - 15.0 g/dL 7.4  7.5  8.4   Hematocrit 36.0 - 46.0 % 23.5  22.7  25.5   Platelets 150 - 400 K/uL 83  80  81       Latest Ref Rng & Units 01/19/2023    5:48 AM 01/12/2023    8:33 AM 01/06/2023   10:11 AM  CMP  Glucose 70 - 99 mg/dL 161  096  045   BUN 6 - 20 mg/dL 18  23  10    Creatinine 0.44 - 1.00 mg/dL 4.09  8.11  9.14   Sodium 135 - 145 mmol/L 136  136  133   Potassium 3.5 - 5.1 mmol/L 3.7  4.7  3.8   Chloride 98 - 111 mmol/L 107  108  103   CO2 22 - 32 mmol/L 25  24  23    Calcium 8.9 - 10.3 mg/dL 8.1  7.8  8.2   Total Protein 6.5 - 8.1 g/dL 5.8  5.2  6.2   Total Bilirubin 0.3 - 1.2 mg/dL 0.6  0.6  1.1   Alkaline Phos 38 - 126 U/L 122  101  146   AST 15 - 41 U/L 44  35  59   ALT 0 - 44 U/L 30  23  42     Imaging studies: No new imaging studies this AM   Assessment/Plan:  57 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  - New 05/14: No acute change noted  - Wound Care ([name] Pouch); Now on smaller [name] pouch; continues managing effluent independently. Intermittent oozing from time to time. Change as needed.    - Continue soft diet as tolerated  - Continue cyclic TPN; weekly nutritional labs - Appreciate dietary assistance - monitoring weight              - Monitor abdominal examination; on-going bowel function            - Pain control prn; antemetic prn            - Progressed with therapies; no longer any recommendations; ambulating well    - Discharge Planning: Continue to anticipate lengthy admission +/- possible transfer; no new progress  regarding disposition unfortunately   All of the above findings and recommendations were discussed with the patient, and the medical team, and all of patient's questions were answered to her expressed satisfaction.  -- [name], PA-C [facility] 01/20/2023, 7:41 AM M-F: 7am - 4pm

## 2023-01-20 NOTE — Plan of Care (Signed)
  Problem: Clinical Measurements: Goal: Will remain free from infection Outcome: Progressing   Problem: Nutrition: Goal: Adequate nutrition will be maintained Outcome: Progressing   Problem: Activity: Goal: Risk for activity intolerance will decrease Outcome: Progressing   Problem: Nutrition: Goal: Adequate nutrition will be maintained Outcome: Progressing   Problem: Coping: Goal: Level of anxiety will decrease Outcome: Progressing   Problem: Pain Managment: Goal: General experience of comfort will improve Outcome: Progressing   

## 2023-01-20 NOTE — Progress Notes (Signed)
Nutrition Follow-up  DOCUMENTATION CODES:   Obesity unspecified  INTERVENTION:   Continue cyclic TPN per pharmacy (16 hrs)- provides 1811kcal/day and 102g/day protein   Ensure Enlive po BID, each supplement provides 350 kcal and 20 grams of protein.  MVI, chromium and trace elements in TPN   Triglycerides checked monthly   Daily weights   Continue Zinc additional 5mg  daily added to TPN   Continue Selenium additional daily added to TPN   Vitamin A 10,000 units po daily   Iron sucrose 300mg  IV daily x 3 days  Vitamins A, zinc and selenium labs pending  Will check routine vitamin labs every 3 months, next due 7/11  NUTRITION DIAGNOSIS:   Increased nutrient needs related to wound healing, catabolic illness as evidenced by estimated needs. -ongoing   GOAL:   Patient will meet greater than or equal to 90% of their needs -met with TPN   MONITOR:   PO intake, Supplement acceptance, Labs, Weight trends, Diet advancement, I & O's, TPN  ASSESSMENT:   57 y/o female with h/o hypothyroidism, COVID 19 (3/21), kidney stones and stage 3 colon cancer (s/p left hemicolectomy 5/21 and chemotherapy) who is admitted for new pelvic mass now s/p laparotomy 6/8 (with excision of pelvic mass from greater omentum, abdominal wall reconstruction with bilateral myocutaneous flaps and mesh, incisional hernia repair, appendectomy repair and VAC placement) complicated by bowel perforation s/p reopening of recent laparotomy 6/10 (with repair of small bowel perforation, excision of mesh, placement of two phasix mesh and VAC placement). Pathology returned as metastatic adenocarcinoma. Pt with L1 compression fracture.   Pt s/p right IJ port 4/12  Pt continues to tolerate TPN well at goal rate; TPN is currently being cycled for 16 hrs via port. Triglycerides wnl and are being checked monthly. Per chart, pt is up ~33lbs since admission; PA aware. Will check routine vitamin labs every 3 months as pt  on chronic TPN (next due 7/11). Vitamins A, zinc and selenium pending as these are added into the TPN. Ferritin returned low; will repeat IV iron. Pt continues to have poor oral intake; pt eating <25% of meals. [name] pouch in place. No discharge plan in place.   Medications reviewed and include: insulin, synthroid, protonix, vitamin A, TPN  -Selenium- pending, zinc- pending -Chromium- 2.0 wnl, copper- 109 wnl, Manganese- 17.9 wnl, vitamin D 74.99 wnl, Iodine 70 wnl, aluminum 10(H)- 4/12 -Iron 25(L), TIBC 398, folate 29.0 wnl, B12 922(H)- 4/12 -Ferritin 14- 5/13 -Vitamin B1- 73.2 wnl, vitamin A- 11.2(L), B6- 8.9 wnl, vitamin C- 0.9 wnl, vitamin E- 12.6 wnl, vitamin K- 0.49 wnl- 4/12 -Carnitine 32.0 wnl- 4/12  Labs reviewed: Na 136 wnl, K 3.7 wnl, P 3.1 wnl, Mg 2.1 wnl- 5/6 Triglycerides- 76- 5/6 Wbc- 2.8(L), Hgb 7.4(L), Hct 23.5(L)- 5/13 Cbgs- 157, 142, 94, 161 x 48 hrs  Diet Order:    Diet Order             DIET SOFT Fluid consistency: Thin  Diet effective now                  EDUCATION NEEDS:   Not appropriate for education at this time  Skin:  Skin Assessment: Reviewed RN Assessment (Midline wound with EC fistula)  Last BM:  5/13  Height:   Ht Readings from Last 1 Encounters:  12/19/22 4\' 11"  (1.499 m)    Weight:   Wt Readings from Last 1 Encounters:  01/20/23 104.7 kg    Ideal Body Weight:  44.3 kg  BMI:  Body mass index is 46.62 kg/m.  Estimated Nutritional Needs:   Kcal:  1800-2100kcal/day  Protein:  90-110g/d  Fluid:  1.4-1.6L/day  Betsey Holiday MS, RD, LDN Please refer to [facility] for RD and/or RD on-call/weekend/after hours pager

## 2023-01-21 LAB — GLUCOSE, CAPILLARY
Glucose-Capillary: 100 mg/dL — ABNORMAL HIGH (ref 70–99)
Glucose-Capillary: 170 mg/dL — ABNORMAL HIGH (ref 70–99)
Glucose-Capillary: 106 mg/dL — ABNORMAL HIGH (ref 70–99)
Glucose-Capillary: 85 mg/dL (ref 70–99)

## 2023-01-21 LAB — ZINC: Zinc: 45 ug/dL (ref 44–115)

## 2023-01-21 MED ORDER — ZINC CHLORIDE 1 MG/ML IV SOLN
INTRAVENOUS | Status: AC
  Filled 2023-01-21: qty 672

## 2023-01-21 NOTE — Progress Notes (Signed)
PHARMACY - TOTAL PARENTERAL NUTRITION CONSULT NOTE   Indication: Prolonged ileus  Patient Measurements: Height: 4\' 11"  (149.9 cm) Weight: 104.5 kg (230 lb 6.1 oz) IBW/kg (Calculated) : 43.2 TPN AdjBW (KG): 57.8 Body mass index is 46.53 kg/m.  Assessment: Debra [name] is a 57 y.o. female s/p laparotomy, excision of greater omental mass, abdominal wall reconstruction with [name], appendectomy, and placement of Prevena vac. New PICC was replaced 4/12.   Glucose / Insulin:  --No apparent history of diabetes --BG controlled on current regimen --mSSI 4x/day (0500, 1300, 1800, 2200) --SSI last 24h: 7 units Electrolytes: WNL on last check Triglycerides WNL Renal: SCr stable at baseline  Hepatic: LFTs, bilirubin wnl GI Imaging: 9/11 CTAP: no new acute issues GI Surgeries / Procedures: s/p laparotomy, excision of greater omental mass, abdominal wall reconstruction with [name], appendectomy, and placement of Prevena vac 12/19/22 placement of right IJ port-a-catheter with dual reservoirs also s/p re-opening of laparotomy for repair of small bowel perforation   Central access: 02/15/22 TPN start date: 02/15/22  Nutritional Goals: Goal cyclic TPN over 16 hrs: cyclic TPN over 16 hrs: (provides 102 g of protein and 1811 kcals per day) total volume over 16hrs for calculations=1400 ml (1500 ml total with overfill)  RD Assessment:  Estimated Needs Total Energy Estimated Needs: 1800-2100kcal/day Total Protein Estimated Needs: 90-110g/d Total Fluid Estimated Needs: 1.4-1.6L/day  Current Nutrition:  Soft diet + nutritional supplements, not meeting PO needs  Plan: Transitioned to *Cyclic* TPN on 1/61/09.   to run over 16 hours per MD request. [name] at 2000 per discussion with dietician to allow patient time for walking in afternoon/evenings.  To run over 16 hours: -Start rate at 49 mL/hr for 1 hour. -Increase rate to 98 mL/hr for 13 hours.  -Decrease rate  to 49 mL/hr for 1 hour. -Decrease rate to 25 mL/hr for 1 hour, then stop.  Plan:  See cyclic TPN rate above Nutritional Components Amino acids (using 15% Clinisol): 101 g Dextrose 19% = 266 g Lipids (using 20% SMOFlipids): 50.4 g kCal: 1817/24h  Electrolytes in TPN: Na 75 mEq/L, K 65mEq/L, Ca 74mEq/L, Mg 10 mEq/L, Phos 10 mmol/L  Continue ratio Cl:Ac 1:1  Adding back trace elements, MVI, chromium, zinc and selenium per dietary recs  Continue CBG/SSI for Cyclic TPN:  -CBG 2 hrs after cyclic TPN start -CBG during middle of cyclic TPN infusion -CBG 1 hr after cylic TPN stopped -CBG while off TPN Continue SSI insulin at moderate scale and remove insulin from TPN bag starting 01/06/23 due to low BG. Will consider re-adding if insulin usage becomes >10 units over  Check TPN labs on Mondays (CMP, phos, Mg, trig)    Elliot Gurney, PharmD, BCPS Clinical Pharmacist  01/21/2023 7:36 AM

## 2023-01-21 NOTE — Progress Notes (Signed)
[facility] SURGICAL PROGRESS NOTE   Hospital Day(s): 342.   Post op day(s): 33 Days Post-Op.   Interval History:  Patient seen and examined No issues overnight; Hemodynamically stable  No new labs this morning; drawn weekly on Mondays No abdominal pain, nausea, emesis, fever [name] output unmeasured in last 24 hours; managing effluent independently No bleeding  On cyclic TPN + soft diet  Ambulating well; independent  Review of Systems:  Constitutional: denies fever, chills  HEENT: denies cough or congestion  Respiratory: denies any shortness of breath  Cardiovascular: denies chest pain or palpitations  Gastrointestinal: denied abdominal pain, denied N/V Genitourinary: denies burning with urination or urinary frequency Integumentary: + midline wound (healing; stable)  Vital signs in last 24 hours: [min-max] current  Temp:  [98.3 F (36.8 C)-99.3 F (37.4 C)] 98.5 F (36.9 C) (05/15 0750) Pulse Rate:  [68-86] 83 (05/15 0750) Resp:  [15-18] 15 (05/15 0750) BP: (94-108)/(55-68) 108/60 (05/15 0750) SpO2:  [93 %-100 %] 96 % (05/15 0750) Weight:  [104.5 kg] 104.5 kg (05/15 0500)     Height: 4\' 11"  (149.9 cm) Weight: 104.5 kg BMI (Calculated): 46.02   Intake/Output last 2 shifts:  No intake/output data recorded.   Physical Exam:  Constitutional: alert, cooperative and no distress  Respiratory: breathing non-labored at rest  Cardiovascular: regular rate and sinus rhythm  Gastrointestinal: Soft, non-tender, non-distended, no rebound/guarding. Integumentary: Midline wound healing by secondary intention, peritoneum closed; granulating and new skin growing over majority of wound now; there are three stomatized areas visible in the LUQ and LLQ portions of the wound. No active bleeding Chest: Port to the right chest; no further swelling or drainage; no evidence of infection   Labs:     Latest Ref Rng & Units 01/19/2023    5:48 AM 01/13/2023    6:33 AM 01/12/2023     8:33 AM  CBC  WBC 4.0 - 10.5 K/uL 2.8  3.2  3.2   Hemoglobin 12.0 - 15.0 g/dL 7.4  7.5  8.4   Hematocrit 36.0 - 46.0 % 23.5  22.7  25.5   Platelets 150 - 400 K/uL 83  80  81       Latest Ref Rng & Units 01/19/2023    5:48 AM 01/12/2023    8:33 AM 01/06/2023   10:11 AM  CMP  Glucose 70 - 99 mg/dL 161  096  045   BUN 6 - 20 mg/dL 18  23  10    Creatinine 0.44 - 1.00 mg/dL 4.09  8.11  9.14   Sodium 135 - 145 mmol/L 136  136  133   Potassium 3.5 - 5.1 mmol/L 3.7  4.7  3.8   Chloride 98 - 111 mmol/L 107  108  103   CO2 22 - 32 mmol/L 25  24  23    Calcium 8.9 - 10.3 mg/dL 8.1  7.8  8.2   Total Protein 6.5 - 8.1 g/dL 5.8  5.2  6.2   Total Bilirubin 0.3 - 1.2 mg/dL 0.6  0.6  1.1   Alkaline Phos 38 - 126 U/L 122  101  146   AST 15 - 41 U/L 44  35  59   ALT 0 - 44 U/L 30  23  42     Imaging studies: No new imaging studies this AM   Assessment/Plan:  57 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  - New 05/15: No acute change noted in last 24 hours   - Wound Care ([name] Pouch); Now on smaller [name] pouch; continues managing effluent independently. Intermittent oozing from time to time. Change as needed.    - Continue soft diet as tolerated  - Continue cyclic TPN; weekly nutritional labs - Appreciate dietary assistance - monitoring weight              - Monitor abdominal examination; on-going bowel function            - Pain control prn; antemetic prn            - Progressed with therapies; no longer any recommendations; ambulating well    - Discharge Planning: Continue to anticipate lengthy admission +/- possible transfer; no new progress regarding disposition  unfortunately   All of the above findings and recommendations were discussed with the patient, and the medical team, and all of patient's questions were answered to her expressed satisfaction.  -- [name], PA-C [facility] 01/21/2023, 8:53 AM M-F: 7am - 4pm

## 2023-01-21 NOTE — Progress Notes (Addendum)
Right chest port needle due to be changed. Once needle removed, noted small amount (< 0.98mL) of serosanguinous fluid oozing from needle site. No swelling, redness noted at or around site; sterile gauze and manual pressure applied X 2 mins. Site re-accessed per protocol without further issues.

## 2023-01-22 LAB — GLUCOSE, CAPILLARY
Glucose-Capillary: 173 mg/dL — ABNORMAL HIGH (ref 70–99)
Glucose-Capillary: 198 mg/dL — ABNORMAL HIGH (ref 70–99)
Glucose-Capillary: 140 mg/dL — ABNORMAL HIGH (ref 70–99)
Glucose-Capillary: 88 mg/dL (ref 70–99)
Glucose-Capillary: 89 mg/dL (ref 70–99)

## 2023-01-22 MED ORDER — ZINC CHLORIDE 1 MG/ML IV SOLN
INTRAVENOUS | Status: AC
  Filled 2023-01-22: qty 672

## 2023-01-22 NOTE — Progress Notes (Signed)
Patient seen and examined.  No new issues.  No more bleeding.  He can pouch in place.  Neck with port. Continue supportive care with wound care and TPN

## 2023-01-22 NOTE — Progress Notes (Signed)
Patient is alert and oriented x4. Ambulated the halls tonight. Refused TPN noting swelling. Patient has significant nonpitting swelling to BUE and BLE. Skin is taut. Will notify general surgery on call regarding patient's refusal. Blood glucose is 89. Denied additonal needs.

## 2023-01-22 NOTE — Progress Notes (Signed)
PHARMACY - TOTAL PARENTERAL NUTRITION CONSULT NOTE   Indication: Prolonged ileus  Patient Measurements: Height: 4\' 11"  (149.9 cm) Weight: 105.3 kg (232 lb 2.3 oz) IBW/kg (Calculated) : 43.2 TPN AdjBW (KG): 57.8 Body mass index is 46.89 kg/m.  Assessment: Debra [name] is a 57 y.o. female s/p laparotomy, excision of greater omental mass, abdominal wall reconstruction with [name], appendectomy, and placement of Prevena vac. New PICC was replaced 4/12.   Glucose / Insulin:  --No apparent history of diabetes --BG controlled on current regimen --mSSI 4x/day (0500, 1300, 1800, 2200) --SSI last 24h: 3 units(refusing CBG checks occasionally) Electrolytes: WNL on last check Triglycerides WNL Renal: SCr stable at baseline  Hepatic: LFTs, bilirubin wnl GI Imaging: 9/11 CTAP: no new acute issues GI Surgeries / Procedures: s/p laparotomy, excision of greater omental mass, abdominal wall reconstruction with [name], appendectomy, and placement of Prevena vac 12/19/22 placement of right IJ port-a-catheter with dual reservoirs also s/p re-opening of laparotomy for repair of small bowel perforation   Central access: 02/15/22 TPN start date: 02/15/22  Nutritional Goals: Goal cyclic TPN over 16 hrs: cyclic TPN over 16 hrs: (provides 102 g of protein and 1811 kcals per day) total volume over 16hrs for calculations=1400 ml (1500 ml total with overfill)  RD Assessment:  Estimated Needs Total Energy Estimated Needs: 1800-2100kcal/day Total Protein Estimated Needs: 90-110g/d Total Fluid Estimated Needs: 1.4-1.6L/day  Current Nutrition:  Soft diet + nutritional supplements, not meeting PO needs  Plan: Transitioned to *Cyclic* TPN on 05/07/55.   to run over 16 hours per MD request. [name] at 2000 per discussion with dietician to allow patient time for walking in afternoon/evenings.  To run over 16 hours: -Start rate at 49 mL/hr for 1 hour. -Increase rate to 98  mL/hr for 13 hours.  -Decrease rate to 49 mL/hr for 1 hour. -Decrease rate to 25 mL/hr for 1 hour, then stop.  Plan:  See cyclic TPN rate above Nutritional Components Amino acids (using 15% Clinisol): 101 g Dextrose 19% = 266 g Lipids (using 20% SMOFlipids): 50.4 g kCal: 1817/24h  Electrolytes in TPN: Na 75 mEq/L, K 66mEq/L, Ca 42mEq/L, Mg 10 mEq/L, Phos 10 mmol/L  Continue ratio Cl:Ac 1:1  Adding back trace elements, MVI, chromium, zinc(increased to 7g on 5/16) and selenium(increased to 100mg  on 5/16) per dietary recs  Continue CBG/SSI for Cyclic TPN:  -CBG 2 hrs after cyclic TPN start -CBG during middle of cyclic TPN infusion -CBG 1 hr after cylic TPN stopped -CBG while off TPN Continue SSI insulin at moderate scale and remove insulin from TPN bag starting 01/06/23 due to low BG. Will consider re-adding if insulin usage becomes >10 units over 24h Check TPN labs on Mondays (CMP, phos, Mg, trig)    Elliot Gurney, PharmD, BCPS Clinical Pharmacist  01/22/2023 9:35 AM

## 2023-01-23 LAB — GLUCOSE, CAPILLARY
Glucose-Capillary: 83 mg/dL (ref 70–99)
Glucose-Capillary: 126 mg/dL — ABNORMAL HIGH (ref 70–99)
Glucose-Capillary: 81 mg/dL (ref 70–99)

## 2023-01-23 LAB — VITAMIN A: Vitamin A (Retinoic Acid): 14.1 ug/dL — ABNORMAL LOW (ref 20.1–62.0)

## 2023-01-23 MED ORDER — ZINC CHLORIDE 1 MG/ML IV SOLN
INTRAVENOUS | Status: AC
  Filled 2023-01-23: qty 672

## 2023-01-23 NOTE — Progress Notes (Signed)
PHARMACY - TOTAL PARENTERAL NUTRITION CONSULT NOTE   Indication: Prolonged ileus  Patient Measurements: Height: 4\' 11"  (149.9 cm) Weight: 105.8 kg (233 lb 4 oz) IBW/kg (Calculated) : 43.2 TPN AdjBW (KG): 57.8 Body mass index is 47.11 kg/m.  Assessment: Debra [name] is a 57 y.o. female s/p laparotomy, excision of greater omental mass, abdominal wall reconstruction with [name], appendectomy, and placement of Prevena vac. New PICC was replaced 4/12.   Glucose / Insulin:  --No apparent history of diabetes --BG controlled on current regimen --mSSI 4x/day (0500, 1300, 1800, 2200) --SSI last 24h: 0 units(refusing CBG checks occasionally, last 2 readings<100) Electrolytes: WNL on last check Triglycerides WNL Renal: SCr stable at baseline  Hepatic: LFTs, bilirubin wnl GI Imaging: 9/11 CTAP: no new acute issues GI Surgeries / Procedures: s/p laparotomy, excision of greater omental mass, abdominal wall reconstruction with [name], appendectomy, and placement of Prevena vac 12/19/22 placement of right IJ port-a-catheter with dual reservoirs also s/p re-opening of laparotomy for repair of small bowel perforation   Central access: 02/15/22 TPN start date: 02/15/22  Nutritional Goals: Goal cyclic TPN over 16 hrs: cyclic TPN over 16 hrs: (provides 102 g of protein and 1811 kcals per day) total volume over 16hrs for calculations=1400 ml (1500 ml total with overfill)  RD Assessment:  Estimated Needs Total Energy Estimated Needs: 1800-2100kcal/day Total Protein Estimated Needs: 90-110g/d Total Fluid Estimated Needs: 1.4-1.6L/day  Current Nutrition:  Soft diet + nutritional supplements, not meeting PO needs  Plan: Transitioned to *Cyclic* TPN on 1/61/09.   to run over 16 hours per MD request. [name] at 2000 per discussion with dietician to allow patient time for walking in afternoon/evenings.  To run over 16 hours: -Start rate at 49 mL/hr for 1  hour. -Increase rate to 98 mL/hr for 13 hours.  -Decrease rate to 49 mL/hr for 1 hour. -Decrease rate to 25 mL/hr for 1 hour, then stop.  Plan:  See cyclic TPN rate above Nutritional Components Amino acids (using 15% Clinisol): 101 g Dextrose 19% = 266 g Lipids (using 20% SMOFlipids): 50.4 g kCal: 1817/24h  Electrolytes in TPN: Na 75 mEq/L, K 41mEq/L, Ca 73mEq/L, Mg 10 mEq/L, Phos 10 mmol/L  Continue ratio Cl:Ac 1:1  Adding back trace elements, MVI, chromium, zinc(increased to 7g on 5/16) and selenium(increased to 100mg  on 5/16) per dietary recs  Continue CBG/SSI for Cyclic TPN:  -CBG 2 hrs after cyclic TPN start -CBG during middle of cyclic TPN infusion -CBG 1 hr after cylic TPN stopped -CBG while off TPN Continue SSI insulin at moderate scale and remove insulin from TPN bag starting 01/06/23 due to low BG. Will consider re-adding if insulin usage becomes >10 units over 24h Check TPN labs on Mondays (CMP, phos, Mg, trig)   Bettey Costa, PharmD Clinical Pharmacist 01/23/2023 8:09 AM

## 2023-01-23 NOTE — Progress Notes (Signed)
CC: EC fistula Subjective: Feeling w anasarca amd declined tpn yesterday  she is agreeable to tpn everyother day  Objective: Vital signs in last 24 hours: Temp:  [98.2 F (36.8 C)-98.8 F (37.1 C)] 98.2 F (36.8 C) (05/17 0422) Pulse Rate:  [73-83] 73 (05/17 0422) Resp:  [18] 18 (05/17 0422) BP: (102-116)/(54-67) 102/54 (05/17 0422) SpO2:  [92 %-100 %] 95 % (05/17 0422) Weight:  [105.8 kg] 105.8 kg (05/17 0420) Last BM Date : 01/21/23  Intake/Output from previous day: 05/16 0701 - 05/17 0700 In: 780 [P.O.:780] Out: -  Intake/Output this shift: No intake/output data recorded.  Physical exam:  NAD  Abd: eakin pouch , no bleeding, no leaks  Lab Results: CBC  No results for input(s): "WBC", "HGB", "HCT", "PLT" in the last 72 hours. BMET No results for input(s): "NA", "K", "CL", "CO2", "GLUCOSE", "BUN", "CREATININE", "CALCIUM" in the last 72 hours. PT/INR No results for input(s): "LABPROT", "INR" in the last 72 hours. ABG No results for input(s): "PHART", "HCO3" in the last 72 hours.  Invalid input(s): "PCO2", "PO2"  Studies/Results: No results found.  Anti-infectives: Anti-infectives (From admission, onward)    Start     Dose/Rate Route Frequency Ordered Stop   12/19/22 1000  ceFAZolin (ANCEF) IVPB 2g/100 mL premix        2 g 200 mL/hr over 30 Minutes Intravenous  Once 12/18/22 1738 12/19/22 1141   09/17/22 1200  vancomycin (VANCOREADY) IVPB 1250 mg/250 mL  Status:  Discontinued        1,250 mg 166.7 mL/hr over 90 Minutes Intravenous Every 24 hours 09/16/22 1040 09/22/22 1139   09/16/22 1200  vancomycin (VANCOREADY) IVPB 1250 mg/250 mL        1,250 mg 166.7 mL/hr over 90 Minutes Intravenous  Once 09/15/22 1706 09/16/22 1255   09/15/22 1200  ceFEPIme (MAXIPIME) 2 g in sodium chloride 0.9 % 100 mL IVPB  Status:  Discontinued        2 g 200 mL/hr over 30 Minutes Intravenous Every 8 hours 09/15/22 1024 09/18/22 1026   09/15/22 1200  vancomycin (VANCOREADY) IVPB  2000 mg/400 mL        2,000 mg 200 mL/hr over 120 Minutes Intravenous  Once 09/15/22 1024 09/15/22 1500   03/04/22 2200  piperacillin-tazobactam (ZOSYN) IVPB 3.375 g        3.375 g 12.5 mL/hr over 240 Minutes Intravenous Every 8 hours 03/04/22 2005 03/18/22 0248   02/21/22 1500  vancomycin (VANCOREADY) IVPB 1250 mg/250 mL  Status:  Discontinued        1,250 mg 166.7 mL/hr over 90 Minutes Intravenous Every 24 hours 02/21/22 1111 02/24/22 1336   02/21/22 1400  meropenem (MERREM) 1 g in sodium chloride 0.9 % 100 mL IVPB        1 g 200 mL/hr over 30 Minutes Intravenous Every 8 hours 02/21/22 1258 02/27/22 2157   02/20/22 1500  vancomycin (VANCOCIN) IVPB 1000 mg/200 mL premix  Status:  Discontinued        1,000 mg 200 mL/hr over 60 Minutes Intravenous Every 24 hours 02/19/22 1706 02/21/22 1111   02/19/22 1115  vancomycin (VANCOREADY) IVPB 2000 mg/400 mL        2,000 mg 200 mL/hr over 120 Minutes Intravenous  Once 02/19/22 1025 02/19/22 1732   02/19/22 1115  fluconazole (DIFLUCAN) IVPB 400 mg        400 mg 100 mL/hr over 120 Minutes Intravenous Every 24 hours 02/19/22 1025 03/04/22 1301   02/15/22 0730  piperacillin-tazobactam (ZOSYN) IVPB 3.375 g  Status:  Discontinued        3.375 g 12.5 mL/hr over 240 Minutes Intravenous Every 8 hours 02/15/22 0639 02/21/22 1235   02/15/22 0655  piperacillin-tazobactam (ZOSYN) 3.375 GM/50ML IVPB       Note to Pharmacy: Loretha Stapler N: cabinet override      02/15/22 0655 02/15/22 0816   02/13/22 2200  cefoTEtan (CEFOTAN) 2 g in sodium chloride 0.9 % 100 mL IVPB  Status:  Discontinued        2 g 200 mL/hr over 30 Minutes Intravenous Every 12 hours 02/13/22 1541 02/15/22 0639   02/13/22 1345  cefoTEtan (CEFOTAN) 2 g in sodium chloride 0.9 % 100 mL IVPB        2 g 200 mL/hr over 30 Minutes Intravenous  Once 02/13/22 1331 02/13/22 2146   02/13/22 1336  sodium chloride 0.9 % with cefoTEtan (CEFOTAN) ADS Med       Note to Pharmacy: Gates Rigg: cabinet  override      02/13/22 1336 02/14/22 0144   02/13/22 0620  sodium chloride 0.9 % with cefoTEtan (CEFOTAN) ADS Med       Note to Pharmacy: Agnes Lawrence A: cabinet override      02/13/22 0620 02/13/22 0802   02/13/22 0600  cefoTEtan (CEFOTAN) 2 g in sodium chloride 0.9 % 100 mL IVPB        2 g 200 mL/hr over 30 Minutes Intravenous On call to O.R. 02/12/22 2229 02/13/22 2146       Assessment/Plan:  Pt feel a lot of anasarca and is requesting to do TPN every other day, d/w pharmacy, I do think is feasible as she is partially absorbing some liquids and nutrients  Sterling Big, MD, FACS  01/23/2023

## 2023-01-24 ENCOUNTER — Other Ambulatory Visit: Payer: Self-pay | Admitting: Surgery

## 2023-01-24 MED ORDER — ZINC CHLORIDE 1 MG/ML IV SOLN
INTRAVENOUS | Status: AC
  Filled 2023-01-24: qty 672

## 2023-01-24 NOTE — Progress Notes (Signed)
FSBS 168, did not flow over

## 2023-01-24 NOTE — Progress Notes (Signed)
PHARMACY - TOTAL PARENTERAL NUTRITION CONSULT NOTE   Indication: Prolonged ileus  Patient Measurements: Height: 4\' 11"  (149.9 cm) Weight: 105.5 kg (232 lb 9.4 oz) IBW/kg (Calculated) : 43.2 TPN AdjBW (KG): 57.8 Body mass index is 46.98 kg/m.  Assessment: Debra [name] is a 57 y.o. female s/p laparotomy, excision of greater omental mass, abdominal wall reconstruction with [name], appendectomy, and placement of Prevena vac. New PICC was replaced 4/12.   Glucose / Insulin:  --No apparent history of diabetes --BG controlled on current regimen --mSSI 4x/day (0500, 1300, 1800, 2200) --SSI last 24h: 0 units(refusing CBG checks occasionally, last 2 readings<100) Electrolytes: WNL on last check Triglycerides WNL Renal: SCr stable at baseline  Hepatic: LFTs, bilirubin wnl GI Imaging: 9/11 CTAP: no new acute issues GI Surgeries / Procedures: s/p laparotomy, excision of greater omental mass, abdominal wall reconstruction with [name], appendectomy, and placement of Prevena vac 12/19/22 placement of right IJ port-a-catheter with dual reservoirs also s/p re-opening of laparotomy for repair of small bowel perforation   Central access: 02/15/22 TPN start date: 02/15/22  Nutritional Goals: Goal cyclic TPN over 16 hrs: cyclic TPN over 16 hrs: (provides 102 g of protein and 1811 kcals per day) total volume over 16hrs for calculations=1400 ml (1500 ml total with overfill)  RD Assessment:  Estimated Needs Total Energy Estimated Needs: 1800-2100kcal/day Total Protein Estimated Needs: 90-110g/d Total Fluid Estimated Needs: 1.4-1.6L/day  Current Nutrition:  Soft diet + nutritional supplements, not meeting PO needs  Plan: Transitioned to *Cyclic* TPN on 1/61/09.   to run over 16 hours per MD request. [name] at 2000 per discussion with dietician to allow patient time for walking in afternoon/evenings.  To run over 16 hours: -Start rate at 49 mL/hr for 1  hour. -Increase rate to 98 mL/hr for 13 hours.  -Decrease rate to 49 mL/hr for 1 hour. -Decrease rate to 25 mL/hr for 1 hour, then stop.  Plan:  See cyclic TPN rate above Nutritional Components Amino acids (using 15% Clinisol): 101 g Dextrose 19% = 266 g Lipids (using 20% SMOFlipids): 50.4 g kCal: 1817/24h  Electrolytes in TPN: Na 75 mEq/L, K 87mEq/L, Ca 56mEq/L, Mg 10 mEq/L, Phos 10 mmol/L  Continue ratio Cl:Ac 1:1  Adding back trace elements, MVI, chromium, zinc(increased to 7g on 5/16) and selenium(increased to 100mg  on 5/16) per dietary recs  Continue CBG/SSI for Cyclic TPN:  -CBG 2 hrs after cyclic TPN start -CBG during middle of cyclic TPN infusion -CBG 1 hr after cylic TPN stopped -CBG while off TPN Continue SSI insulin at moderate scale and remove insulin from TPN bag starting 01/06/23 due to low BG. Will consider re-adding if insulin usage becomes >10 units over 24h Check TPN labs on Mondays (CMP, phos, Mg, trig)   Bettey Costa, PharmD Clinical Pharmacist 01/24/2023 9:39 AM

## 2023-01-24 NOTE — Progress Notes (Signed)
[facility] SURGICAL PROGRESS NOTE   Hospital Day(s): 345.   Post op day(s): 36 Days Post-Op.   Interval History:  No acute events overnight.  Patient denies any bleeding coming from the midline wound.  Tolerating soft diet.  Continues on TPN.  Review of Systems:  Constitutional: denies fever, chills  HEENT: denies cough or congestion  Respiratory: denies any shortness of breath  Cardiovascular: denies chest pain or palpitations  Gastrointestinal: denied abdominal pain, denied N/V Genitourinary: denies burning with urination or urinary frequency Integumentary: + midline wound (healing; stable)  Vital signs in last 24 hours: [min-max] current  Temp:  [97.9 F (36.6 C)-98.4 F (36.9 C)] 98.3 F (36.8 C) (05/18 1037) Pulse Rate:  [71-85] 85 (05/18 1037) Resp:  [16-20] 20 (05/18 1037) BP: (110-119)/(59-70) 117/64 (05/18 1037) SpO2:  [98 %-99 %] 99 % (05/18 1037) Weight:  [105.5 kg] 105.5 kg (05/18 0500)     Height: 4\' 11"  (149.9 cm) Weight: 105.5 kg BMI (Calculated): 46.02   Intake/Output last 2 shifts:  05/17 0701 - 05/18 0700 In: 154.3 [I.V.:154.3] Out: -    Physical Exam:  Constitutional: alert, cooperative and no distress  Respiratory: breathing non-labored at rest  Cardiovascular: regular rate and sinus rhythm  Gastrointestinal: Soft, non-tender, non-distended, no rebound/guarding. Integumentary: Midline wound healing by secondary intention, peritoneum closed; granulating and new skin growing over majority of wound now; there are three stomatized areas visible in the LUQ and LLQ portions of the wound. No active bleeding   Labs:     Latest Ref Rng & Units 01/19/2023    5:48 AM 01/13/2023    6:33 AM 01/12/2023    8:33 AM  CBC  WBC 4.0 - 10.5 K/uL 2.8  3.2  3.2   Hemoglobin 12.0 - 15.0 g/dL 7.4  7.5  8.4   Hematocrit 36.0 - 46.0 % 23.5  22.7  25.5   Platelets 150 - 400 K/uL 83  80  81       Latest Ref Rng & Units 01/19/2023    5:48 AM 01/12/2023     8:33 AM 01/06/2023   10:11 AM  CMP  Glucose 70 - 99 mg/dL 098  119  147   BUN 6 - 20 mg/dL 18  23  10    Creatinine 0.44 - 1.00 mg/dL 8.29  5.62  1.30   Sodium 135 - 145 mmol/L 136  136  133   Potassium 3.5 - 5.1 mmol/L 3.7  4.7  3.8   Chloride 98 - 111 mmol/L 107  108  103   CO2 22 - 32 mmol/L 25  24  23    Calcium 8.9 - 10.3 mg/dL 8.1  7.8  8.2   Total Protein 6.5 - 8.1 g/dL 5.8  5.2  6.2   Total Bilirubin 0.3 - 1.2 mg/dL 0.6  0.6  1.1   Alkaline Phos 38 - 126 U/L 122  101  146   AST 15 - 41 U/L 44  35  59   ALT 0 - 44 U/L 30  23  42     Imaging studies: No new imaging studies this AM   Assessment/Plan:  57 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  - New 05/18: No acute  change noted in last 24 hours   - Wound Care ([name] Pouch); Now on smaller [name] pouch; continues managing effluent independently. Intermittent oozing from time to time. Change as needed.    - Continue soft diet as tolerated  - Continue cyclic TPN; weekly nutritional labs - Appreciate dietary assistance - monitoring weight              - Monitor abdominal examination; on-going bowel function            - Pain control prn; antemetic prn            - Progressed with therapies; no longer any recommendations; ambulating well    I spent 35 minutes dedicated to the care of this patient on the date of this encounter to include pre-visit review of records, face-to-face time with the patient discussing diagnosis and management, and any post-visit coordination of care.  Henrene Dodge, MD

## 2023-01-25 LAB — GLUCOSE, CAPILLARY: Glucose-Capillary: 103 mg/dL — ABNORMAL HIGH (ref 70–99)

## 2023-01-25 MED ORDER — ZINC CHLORIDE 1 MG/ML IV SOLN
INTRAVENOUS | Status: AC
  Filled 2023-01-25: qty 672

## 2023-01-25 NOTE — Plan of Care (Signed)
  Problem: Clinical Measurements: Goal: Will remain free from infection Outcome: Progressing   Problem: Nutrition: Goal: Adequate nutrition will be maintained Outcome: Progressing   Problem: Activity: Goal: Risk for activity intolerance will decrease Outcome: Progressing   Problem: Nutrition: Goal: Adequate nutrition will be maintained Outcome: Progressing   Problem: Coping: Goal: Level of anxiety will decrease Outcome: Progressing   Problem: Pain Managment: Goal: General experience of comfort will improve Outcome: Progressing   

## 2023-01-25 NOTE — Progress Notes (Signed)
PHARMACY - TOTAL PARENTERAL NUTRITION CONSULT NOTE   Indication: Prolonged ileus  Patient Measurements: Height: 4\' 11"  (149.9 cm) Weight: 105.3 kg (232 lb 2.3 oz) IBW/kg (Calculated) : 43.2 TPN AdjBW (KG): 57.8 Body mass index is 46.89 kg/m.  Assessment: Debra [name] is a 57 y.o. female s/p laparotomy, excision of greater omental mass, abdominal wall reconstruction with [name], appendectomy, and placement of Prevena vac. New PICC was replaced 4/12.   Glucose / Insulin:  --No apparent history of diabetes --BG controlled on current regimen --mSSI 4x/day (0500, 1300, 1800, 2200) --SSI last 24h: 0 units(refusing CBG checks intermittently, last checked 5/17) Electrolytes: WNL on last check Triglycerides WNL Renal: SCr stable at baseline  Hepatic: LFTs, bilirubin wnl GI Imaging: 9/11 CTAP: no new acute issues GI Surgeries / Procedures: s/p laparotomy, excision of greater omental mass, abdominal wall reconstruction with [name], appendectomy, and placement of Prevena vac 12/19/22 placement of right IJ port-a-catheter with dual reservoirs also s/p re-opening of laparotomy for repair of small bowel perforation   Central access: 02/15/22 TPN start date: 02/15/22  Nutritional Goals: Goal cyclic TPN over 16 hrs: cyclic TPN over 16 hrs: (provides 102 g of protein and 1811 kcals per day) total volume over 16hrs for calculations=1400 ml (1500 ml total with overfill)  RD Assessment:  Estimated Needs Total Energy Estimated Needs: 1800-2100kcal/day Total Protein Estimated Needs: 90-110g/d Total Fluid Estimated Needs: 1.4-1.6L/day  Current Nutrition:  Soft diet + nutritional supplements, not meeting PO needs  Plan: Transitioned to *Cyclic* TPN on 1/61/09.   to run over 16 hours per MD request. [name] at 2000 per discussion with dietician to allow patient time for walking in afternoon/evenings.  To run over 16 hours: -Start rate at 49 mL/hr for 1  hour. -Increase rate to 98 mL/hr for 13 hours.  -Decrease rate to 49 mL/hr for 1 hour. -Decrease rate to 25 mL/hr for 1 hour, then stop.  Plan:  See cyclic TPN rate above Nutritional Components Amino acids (using 15% Clinisol): 101 g Dextrose 19% = 266 g Lipids (using 20% SMOFlipids): 50.4 g kCal: 1817/24h  Electrolytes in TPN: Na 75 mEq/L, K 63mEq/L, Ca 51mEq/L, Mg 10 mEq/L, Phos 10 mmol/L  Continue ratio Cl:Ac 1:1  Adding back trace elements, MVI, chromium, zinc(increased to 7g on 5/16) and selenium(increased to 100mg  on 5/16) per dietary recs  Continue CBG/SSI for Cyclic TPN:  -CBG 2 hrs after cyclic TPN start -CBG during middle of cyclic TPN infusion -CBG 1 hr after cylic TPN stopped -CBG while off TPN Continue SSI insulin at moderate scale and remove insulin from TPN bag starting 01/06/23 due to low BG. Will consider re-adding if insulin usage becomes >10 units over 24h Check TPN labs on Mondays (CMP, phos, Mg, trig)   Bettey Costa, PharmD Clinical Pharmacist 01/25/2023 10:11 AM

## 2023-01-25 NOTE — Progress Notes (Signed)
[facility] SURGICAL PROGRESS NOTE   Hospital Day(s): 345.   Post op day(s): 36 Days Post-Op.   Interval History:  No acute events overnight.  Patient denies any bleeding coming from the midline wound.  Tolerating soft diet.  Continues on TPN.  Reports the swelling and pain in her neck are much improved and almost resolved.  Review of Systems:  Constitutional: denies fever, chills  HEENT: denies cough or congestion  Respiratory: denies any shortness of breath  Cardiovascular: denies chest pain or palpitations  Gastrointestinal: denied abdominal pain, denied N/V Genitourinary: denies burning with urination or urinary frequency Integumentary: + midline wound (healing; stable)  Vital signs in last 24 hours: [min-max] current  Temp:  [97.9 F (36.6 C)-98.4 F (36.9 C)] 98.3 F (36.8 C) (05/18 1037) Pulse Rate:  [71-85] 85 (05/18 1037) Resp:  [16-20] 20 (05/18 1037) BP: (110-119)/(59-70) 117/64 (05/18 1037) SpO2:  [98 %-99 %] 99 % (05/18 1037) Weight:  [105.5 kg] 105.5 kg (05/18 0500)     Height: 4\' 11"  (149.9 cm) Weight: 105.5 kg BMI (Calculated): 46.02   Intake/Output last 2 shifts:  05/17 0701 - 05/18 0700 In: 154.3 [I.V.:154.3] Out: -    Physical Exam:  Constitutional: alert, cooperative and no distress  Respiratory: breathing non-labored at rest  Cardiovascular: regular rate and sinus rhythm  Gastrointestinal: Soft, non-tender, non-distended, no rebound/guarding. Integumentary: Midline wound healing by secondary intention, peritoneum closed; granulating and new skin growing over majority of wound now; there are three stomatized areas visible in the LUQ and LLQ portions of the wound. No active bleeding   Labs:     Latest Ref Rng & Units 01/19/2023    5:48 AM 01/13/2023    6:33 AM 01/12/2023    8:33 AM  CBC  WBC 4.0 - 10.5 K/uL 2.8  3.2  3.2   Hemoglobin 12.0 - 15.0 g/dL 7.4  7.5  8.4   Hematocrit 36.0 - 46.0 % 23.5  22.7  25.5   Platelets 150 - 400 K/uL  83  80  81       Latest Ref Rng & Units 01/19/2023    5:48 AM 01/12/2023    8:33 AM 01/06/2023   10:11 AM  CMP  Glucose 70 - 99 mg/dL 811  914  782   BUN 6 - 20 mg/dL 18  23  10    Creatinine 0.44 - 1.00 mg/dL 9.56  2.13  0.86   Sodium 135 - 145 mmol/L 136  136  133   Potassium 3.5 - 5.1 mmol/L 3.7  4.7  3.8   Chloride 98 - 111 mmol/L 107  108  103   CO2 22 - 32 mmol/L 25  24  23    Calcium 8.9 - 10.3 mg/dL 8.1  7.8  8.2   Total Protein 6.5 - 8.1 g/dL 5.8  5.2  6.2   Total Bilirubin 0.3 - 1.2 mg/dL 0.6  0.6  1.1   Alkaline Phos 38 - 126 U/L 122  101  146   AST 15 - 41 U/L 44  35  59   ALT 0 - 44 U/L 30  23  42     Imaging studies: No new imaging studies this AM   Assessment/Plan:  57 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  - New 05/19: No acute change noted in last 24 hours   - Wound Care ([name] Pouch); Now on smaller [name] pouch; continues managing effluent independently. Intermittent oozing from time to time. Change as needed.    - Continue soft diet as tolerated  - Continue cyclic TPN; weekly nutritional labs - Appreciate dietary assistance - monitoring weight              - Monitor abdominal examination; on-going bowel function            - Pain control prn; antemetic prn            - Progressed with therapies; no longer any recommendations; ambulating well    I spent 35 minutes dedicated to the care of this patient on the date of this encounter to include pre-visit review of records, face-to-face time with the patient discussing diagnosis and management, and any post-visit coordination of care.  Henrene Dodge, MD

## 2023-01-26 LAB — COMPREHENSIVE METABOLIC PANEL
ALT: 28 U/L (ref 0–44)
AST: 44 U/L — ABNORMAL HIGH (ref 15–41)
Albumin: 2.5 g/dL — ABNORMAL LOW (ref 3.5–5.0)
Alkaline Phosphatase: 122 U/L (ref 38–126)
Anion gap: 5 (ref 5–15)
BUN: 17 mg/dL (ref 6–20)
CO2: 23 mmol/L (ref 22–32)
Calcium: 8 mg/dL — ABNORMAL LOW (ref 8.9–10.3)
Chloride: 109 mmol/L (ref 98–111)
Creatinine, Ser: 0.46 mg/dL (ref 0.44–1.00)
GFR, Estimated: >60 mL/min (ref >60)
Glucose, Bld: 181 mg/dL — ABNORMAL HIGH (ref 70–99)
Potassium: 3.9 mmol/L (ref 3.5–5.1)
Sodium: 137 mmol/L (ref 135–145)
Total Bilirubin: 0.8 mg/dL (ref 0.3–1.2)
Total Protein: 5.5 g/dL — ABNORMAL LOW (ref 6.5–8.1)

## 2023-01-26 LAB — GLUCOSE, CAPILLARY
Glucose-Capillary: 129 mg/dL — ABNORMAL HIGH (ref 70–99)
Glucose-Capillary: 168 mg/dL — ABNORMAL HIGH (ref 70–99)
Glucose-Capillary: 134 mg/dL — ABNORMAL HIGH (ref 70–99)
Glucose-Capillary: 86 mg/dL (ref 70–99)
Glucose-Capillary: 191 mg/dL — ABNORMAL HIGH (ref 70–99)
Glucose-Capillary: 183 mg/dL — ABNORMAL HIGH (ref 70–99)
Glucose-Capillary: 107 mg/dL — ABNORMAL HIGH (ref 70–99)
Glucose-Capillary: 124 mg/dL — ABNORMAL HIGH (ref 70–99)
Glucose-Capillary: 171 mg/dL — ABNORMAL HIGH (ref 70–99)
Glucose-Capillary: 154 mg/dL — ABNORMAL HIGH (ref 70–99)
Glucose-Capillary: 78 mg/dL (ref 70–99)
Glucose-Capillary: 102 mg/dL — ABNORMAL HIGH (ref 70–99)

## 2023-01-26 LAB — MAGNESIUM: Magnesium: 2 mg/dL (ref 1.7–2.4)

## 2023-01-26 LAB — PHOSPHORUS: Phosphorus: 2.6 mg/dL (ref 2.5–4.6)

## 2023-01-26 MED ORDER — FUROSEMIDE 10 MG/ML IJ SOLN
20.0000 mg | Freq: Every day | INTRAMUSCULAR | Status: DC
  Administered 2023-01-26 – 2023-03-15 (×48): 20 mg via INTRAVENOUS
  Filled 2023-01-26 (×49): qty 4

## 2023-01-26 MED ORDER — ZINC CHLORIDE 1 MG/ML IV SOLN
INTRAVENOUS | Status: AC
  Filled 2023-01-26: qty 672

## 2023-01-26 NOTE — Progress Notes (Signed)
Patient seen and examined.  No new issues over the weekend.  No bleeding.  CMP normal.  She still complains of swelling.  We will add daily Lasix and monitor.

## 2023-01-26 NOTE — Progress Notes (Signed)
PHARMACY - TOTAL PARENTERAL NUTRITION CONSULT NOTE   Indication: Prolonged ileus  Patient Measurements: Height: 4\' 11"  (149.9 cm) Weight: 104.7 kg (230 lb 13.2 oz) IBW/kg (Calculated) : 43.2 TPN AdjBW (KG): 57.8 Body mass index is 46.62 kg/m.  Assessment: Debra Eldina [name] is a 57 y.o. female s/p laparotomy, excision of greater omental mass, abdominal wall reconstruction with [name], appendectomy, and placement of Prevena vac. New PICC was replaced 4/12.   Glucose / Insulin:  --No apparent history of diabetes --BG controlled on current regimen --mSSI 4x/day (0500, 1300, 1800, 2200) --SSI last 24h: 5 units Electrolytes: WNL Triglycerides WNL Renal: SCr stable at baseline  Hepatic: LFTs, bilirubin WNL GI Imaging: 9/11 CTAP: no new acute issues GI Surgeries / Procedures: s/p laparotomy, excision of greater omental mass, abdominal wall reconstruction with [name], appendectomy, and placement of Prevena vac 12/19/22 placement of right IJ port-a-catheter with dual reservoirs also s/p re-opening of laparotomy for repair of small bowel perforation   Central access: 02/15/22 TPN start date: 02/15/22  Nutritional Goals: Goal cyclic TPN over 16 hrs: cyclic TPN over 16 hrs: (provides 102 g of protein and 1811 kcals per day) total volume over 16hrs for calculations=1400 ml (1500 ml total with overfill)  RD Assessment:  Estimated Needs Total Energy Estimated Needs: 1800-2100kcal/day Total Protein Estimated Needs: 90-110g/d Total Fluid Estimated Needs: 1.4-1.6L/day  Current Nutrition:  Soft diet + nutritional supplements, not meeting PO needs  Plan: Transitioned to *Cyclic* TPN on 12/16/79.   to run over 16 hours per MD request. [name] at 2000 per discussion with dietician to allow patient time for walking in afternoon/evenings.  To run over 16 hours: -Start rate at 49 mL/hr for 1 hour. -Increase rate to 98 mL/hr for 13 hours.  -Decrease rate to 49 mL/hr  for 1 hour. -Decrease rate to 25 mL/hr for 1 hour, then stop.  Plan:  See cyclic TPN rate above Nutritional Components Amino acids (using 15% Clinisol): 101 g Dextrose 19% = 266 g Lipids (using 20% SMOFlipids): 50.4 g kCal: 1817/24h  Electrolytes in TPN: Na 75 mEq/L, K 23mEq/L, Ca 32mEq/L, Mg 10 mEq/L, Phos 10 mmol/L  Continue ratio Cl:Ac 1:1  Adding back trace elements, MVI, chromium, zinc(increased to 7g on 5/16) and selenium(increased to 100mg  on 5/16) per dietary recs  Continue CBG/SSI for Cyclic TPN:  -CBG 2 hrs after cyclic TPN start -CBG during middle of cyclic TPN infusion -CBG 1 hr after cylic TPN stopped -CBG while off TPN Continue SSI insulin at moderate scale and remove insulin from TPN bag starting 01/06/23 due to low BG. Will consider re-adding if insulin usage becomes >10 units over 24h Check TPN labs on Mondays (CMP, phos, Mg, trig)   Will M. Dareen Piano, PharmD PGY-1 Pharmacy Resident 01/26/2023 9:37 AM

## 2023-01-27 LAB — CBC
HCT: 24 % — ABNORMAL LOW (ref 36.0–46.0)
Hemoglobin: 7.5 g/dL — ABNORMAL LOW (ref 12.0–15.0)
MCH: 28.3 pg (ref 26.0–34.0)
MCHC: 31.3 g/dL (ref 30.0–36.0)
MCV: 90.6 fL (ref 80.0–100.0)
Platelets: 73 10*3/uL — ABNORMAL LOW (ref 150–400)
RBC: 2.65 MIL/uL — ABNORMAL LOW (ref 3.87–5.11)
RDW: 19.5 % — ABNORMAL HIGH (ref 11.5–15.5)
WBC: 2.4 10*3/uL — ABNORMAL LOW (ref 4.0–10.5)
nRBC: 0 % (ref 0.0–0.2)

## 2023-01-27 LAB — GLUCOSE, CAPILLARY
Glucose-Capillary: 187 mg/dL — ABNORMAL HIGH (ref 70–99)
Glucose-Capillary: 126 mg/dL — ABNORMAL HIGH (ref 70–99)
Glucose-Capillary: 126 mg/dL — ABNORMAL HIGH (ref 70–99)
Glucose-Capillary: 160 mg/dL — ABNORMAL HIGH (ref 70–99)

## 2023-01-27 MED ORDER — ZINC CHLORIDE 1 MG/ML IV SOLN
INTRAVENOUS | Status: AC
  Filled 2023-01-27: qty 672

## 2023-01-27 NOTE — Progress Notes (Signed)
Nutrition Follow-up  DOCUMENTATION CODES:   Obesity unspecified  INTERVENTION:   Continue cyclic TPN per pharmacy (16 hrs)- provides 1812 kcal/day and 101 g/day protein    Ensure Enlive po BID, each supplement provides 350 kcal and 20 grams of protein.   MVI, chromium and trace elements in TPN    Triglycerides checked monthly    Daily weights    Continue Zinc additional 5mg  daily added to TPN    Continue Selenium additional daily added to TPN    Vitamin A 10,000 units po daily    Iron sucrose 300mg  IV daily x 3 days   Vitamins A, zinc and selenium labs pending   Will check routine vitamin labs every 3 months, next due 7/11  NUTRITION DIAGNOSIS:   Increased nutrient needs related to wound healing, catabolic illness as evidenced by estimated needs.  Ongoing  GOAL:   Patient will meet greater than or equal to 90% of their needs  Met with TPN  MONITOR:   PO intake, Supplement acceptance, Labs, Weight trends, Diet advancement, I & O's  REASON FOR ASSESSMENT:   Consult New TPN/TNA  ASSESSMENT:   57 y/o female with h/o hypothyroidism, COVID 19 (3/21), kidney stones and stage 3 colon cancer (s/p left hemicolectomy 5/21 and chemotherapy) who is admitted for new pelvic mass now s/p laparotomy 6/8 (with excision of pelvic mass from greater omentum, abdominal wall reconstruction with bilateral myocutaneous flaps and mesh, incisional hernia repair, appendectomy repair and VAC placement) complicated by bowel perforation s/p reopening of recent laparotomy 6/10 (with repair of small bowel perforation, excision of mesh, placement of two phasix mesh and VAC placement).  Pt s/p right IJ port 4/12  5/16- refused TPN secondary to swelling  Reviewed I/O's: +240 ml x 24 hours and +10.3 L since 01/13/23   Pt with continued minimal intake. Noted meal completions 0-50% on soft diet. Pt has Ensure supplements ordered, but noted that pt is mostly refusing.   Pt continues on  smaller eakin pouch; she remains with some intermittent oozing from time to time, but continues to manage herself. Continue plan to change pouch as needed.   Pt continues to receive cyclic TPN for nutritional support (16 hours); regimen provides 1812 kcals and 101 grams protein, which meets 100% of estimated kcal and protein needs.   Wt has been stable over the past week. Suspect weight loss pending secondary to anarsarca and addition of diuretics.   Medications reviewed and include lasix (started 01/26/23) and vitamin A.   Labs reviewed: Zinc: WDL (45 on 01/19/23), Selenium lab remains pending (last drawn on 10/16/22). CBGS: 187 (inpatient orders for glycemic control are 0-15 units insulin aspart 4 times daily).    Diet Order:   Diet Order             DIET SOFT Fluid consistency: Thin  Diet effective now                   EDUCATION NEEDS:   Not appropriate for education at this time  Skin:  Skin Assessment: Reviewed RN Assessment (abdominal wound 11 x 12 cms) Skin Integrity Issues:: Incisions Incisions: closed abdomen  Last BM:  01/25/23  Height:   Ht Readings from Last 1 Encounters:  12/19/22 4\' 11"  (1.499 m)    Weight:   Wt Readings from Last 1 Encounters:  01/27/23 104 kg    Ideal Body Weight:  44.3 kg  BMI:  Body mass index is 46.31 kg/m.  Estimated Nutritional  Needs:   Kcal:  1800-2100kcal/day  Protein:  90-110g/d  Fluid:  1.4-1.6L/day    Levada Schilling, RD, LDN, CDCES Registered Dietitian II Certified Diabetes Care and Education Specialist Please refer to [facility] for RD and/or RD on-call/weekend/after hours pager

## 2023-01-27 NOTE — Progress Notes (Signed)
Blood sugar 160, did not flow over.

## 2023-01-27 NOTE — Progress Notes (Addendum)
[facility] SURGICAL PROGRESS NOTE   Hospital Day(s): 348.   Post op day(s): 39 Days Post-Op.   Interval History:  Patient seen and examined No issues overnight; Hemodynamically stable  No new labs this morning; drawn weekly on Mondays No abdominal pain, nausea, emesis, fever Now on daily lasix; 20 mg [name] output unmeasured in last 24 hours; managing effluent independently No bleeding  On cyclic TPN + soft diet  Ambulating well; independent  Review of Systems:  Constitutional: denies fever, chills  HEENT: denies cough or congestion  Respiratory: denies any shortness of breath  Cardiovascular: denies chest pain or palpitations  Gastrointestinal: denied abdominal pain, denied N/V Genitourinary: denies burning with urination or urinary frequency Integumentary: + midline wound (healing; stable)  Vital signs in last 24 hours: [min-max] current  Temp:  [98.1 F (36.7 C)-98.7 F (37.1 C)] 98.7 F (37.1 C) (05/21 0312) Pulse Rate:  [75-96] 96 (05/21 0312) Resp:  [17-18] 18 (05/21 0312) BP: (109-122)/(58-64) 122/58 (05/21 0312) SpO2:  [96 %-99 %] 97 % (05/21 0312) Weight:  [104 kg] 104 kg (05/21 0207)     Height: 4\' 11"  (149.9 cm) Weight: 104 kg BMI (Calculated): 46.02   Intake/Output last 2 shifts:  05/20 0701 - 05/21 0700 In: 240 [P.O.:240] Out: -    Physical Exam:  Constitutional: alert, cooperative and no distress  Respiratory: breathing non-labored at rest  Cardiovascular: regular rate and sinus rhythm  Gastrointestinal: Soft, non-tender, non-distended, no rebound/guarding. Integumentary: Midline wound healing by secondary intention, peritoneum closed; granulating and new skin growing over majority of wound now; there are three stomatized areas visible in the LUQ and LLQ portions of the wound. No active bleeding Chest: Port to the right chest; no further swelling or drainage; no evidence of infection   Labs:     Latest Ref Rng & Units 01/19/2023     5:48 AM 01/13/2023    6:33 AM 01/12/2023    8:33 AM  CBC  WBC 4.0 - 10.5 K/uL 2.8  3.2  3.2   Hemoglobin 12.0 - 15.0 g/dL 7.4  7.5  8.4   Hematocrit 36.0 - 46.0 % 23.5  22.7  25.5   Platelets 150 - 400 K/uL 83  80  81       Latest Ref Rng & Units 01/26/2023    6:24 AM 01/19/2023    5:48 AM 01/12/2023    8:33 AM  CMP  Glucose 70 - 99 mg/dL 161  096  045   BUN 6 - 20 mg/dL 17  18  23    Creatinine 0.44 - 1.00 mg/dL 4.09  8.11  9.14   Sodium 135 - 145 mmol/L 137  136  136   Potassium 3.5 - 5.1 mmol/L 3.9  3.7  4.7   Chloride 98 - 111 mmol/L 109  107  108   CO2 22 - 32 mmol/L 23  25  24    Calcium 8.9 - 10.3 mg/dL 8.0  8.1  7.8   Total Protein 6.5 - 8.1 g/dL 5.5  5.8  5.2   Total Bilirubin 0.3 - 1.2 mg/dL 0.8  0.6  0.6   Alkaline Phos 38 - 126 U/L 122  122  101   AST 15 - 41 U/L 44  44  35   ALT 0 - 44 U/L 28  30  23      Imaging studies: No new imaging studies this AM   Assessment/Plan:  57 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  - New 05/21: No acute changes  - Wound Care ([name] Pouch); Now on smaller [name] pouch; continues managing effluent independently. Intermittent oozing from time to time. Change as needed.    - Continue soft diet as tolerated  - Continue cyclic TPN; weekly nutritional labs - Appreciate dietary assistance - monitoring weight; now on daily Lasix as of 05/20             - Monitor abdominal examination; on-going bowel function            - Pain control prn; antemetic prn            - Progressed with therapies; no longer any recommendations; ambulating well    - Discharge Planning: Continue to anticipate lengthy admission +/- possible  transfer; no new progress regarding disposition unfortunately   All of the above findings and recommendations were discussed with the patient, and the medical team, and all of patient's questions were answered to her expressed satisfaction.  -- [name], PA-C [facility] 01/27/2023, 7:18 AM M-F: 7am - 4pm

## 2023-01-27 NOTE — Progress Notes (Addendum)
PHARMACY - TOTAL PARENTERAL NUTRITION CONSULT NOTE   Indication: Prolonged ileus  Patient Measurements: Height: 4\' 11"  (149.9 cm) Weight: 104 kg (229 lb 4.5 oz) IBW/kg (Calculated) : 43.2 TPN AdjBW (KG): 57.8 Body mass index is 46.31 kg/m.  Assessment: Debra [name] is a 57 y.o. female s/p laparotomy, excision of greater omental mass, abdominal wall reconstruction with [name], appendectomy, and placement of Prevena vac. New PICC was replaced 4/12.   Glucose / Insulin:  --No apparent history of diabetes --BG controlled on current regimen --mSSI 4x/day (0500, 1300, 1800, 2200) --SSI last 24h: 6 units Electrolytes: WNL Triglycerides WNL Renal: SCr stable at baseline. Furosemide 20 mg IV daily added on 5/20 Hepatic: LFTs, bilirubin WNL GI Imaging: 9/11 CTAP: no new acute issues GI Surgeries / Procedures: s/p laparotomy, excision of greater omental mass, abdominal wall reconstruction with [name], appendectomy, and placement of Prevena vac 12/19/22 placement of right IJ port-a-catheter with dual reservoirs also s/p re-opening of laparotomy for repair of small bowel perforation   Central access: 02/15/22 TPN start date: 02/15/22  Nutritional Goals: Goal cyclic TPN over 16 hrs: cyclic TPN over 16 hrs: (provides 102 g of protein and 1811 kcals per day) total volume over 16hrs for calculations=1400 ml (1500 ml total with overfill)  RD Assessment:  Estimated Needs Total Energy Estimated Needs: 1800-2100kcal/day Total Protein Estimated Needs: 90-110g/d Total Fluid Estimated Needs: 1.4-1.6L/day  Current Nutrition:  Soft diet + nutritional supplements, not meeting PO needs  Plan: Transitioned to *Cyclic* TPN on 12/16/79.   to run over 16 hours per MD request. [name] at 2000 per discussion with dietician to allow patient time for walking in afternoon/evenings.  To run over 16 hours: -Start rate at 49 mL/hr for 1 hour. -Increase rate to 98 mL/hr for 13  hours.  -Decrease rate to 49 mL/hr for 1 hour. -Decrease rate to 25 mL/hr for 1 hour, then stop.  Plan:  See cyclic TPN rate above Nutritional Components Amino acids (using 15% Clinisol): 101 g Dextrose 19% = 266 g Lipids (using 20% SMOFlipids): 50.4 g kCal: 1817/24h  Electrolytes in TPN: Na 75 mEq/L, K 27mEq/L, Ca 74mEq/L, Mg 10 mEq/L, Phos 10 mmol/L  Continue ratio Cl:Ac 1:1  Adding back trace elements, MVI, chromium, zinc(increased to 7g on 5/16) and selenium(increased to 100mg  on 5/16) per dietary recs  Continue CBG/SSI for Cyclic TPN:  -CBG 2 hrs after cyclic TPN start -CBG during middle of cyclic TPN infusion -CBG 1 hr after cylic TPN stopped -CBG while off TPN Continue SSI insulin at moderate scale and remove insulin from TPN bag starting 01/06/23 due to low BG. Will consider re-adding if insulin usage becomes >10 units over 24h Check TPN labs on Mondays (CMP, phos, Mg, trig)   Will M. Dareen Piano, PharmD PGY-1 Pharmacy Resident 01/27/2023 8:38 AM

## 2023-01-28 LAB — GLUCOSE, CAPILLARY
Glucose-Capillary: 171 mg/dL — ABNORMAL HIGH (ref 70–99)
Glucose-Capillary: 125 mg/dL — ABNORMAL HIGH (ref 70–99)
Glucose-Capillary: 91 mg/dL (ref 70–99)

## 2023-01-28 MED ORDER — ZINC CHLORIDE 1 MG/ML IV SOLN
INTRAVENOUS | Status: AC
  Filled 2023-01-28: qty 672

## 2023-01-28 NOTE — Progress Notes (Signed)
R PAC needle due for changed.Small amount blood noted at the needle site.No swelling,no redness noted around site.Site accessed per protocol and pt tolerated well ,with no complaints.

## 2023-01-28 NOTE — Progress Notes (Signed)
[facility] SURGICAL PROGRESS NOTE   Hospital Day(s): 349.   Post op day(s): 40 Days Post-Op.   Interval History:  Patient seen and examined No issues overnight; Hemodynamically stable - BP soft No new labs this morning No abdominal pain, nausea, emesis, fever [name] output 100 ccs + unmeasured in last 24 hours; managing effluent independently No bleeding  On cyclic TPN + soft diet  Ambulating well; independent  Review of Systems:  Constitutional: denies fever, chills  HEENT: denies cough or congestion  Respiratory: denies any shortness of breath  Cardiovascular: denies chest pain or palpitations  Gastrointestinal: denied abdominal pain, denied N/V Genitourinary: denies burning with urination or urinary frequency Integumentary: + midline wound (healing; stable)  Vital signs in last 24 hours: [min-max] current  Temp:  [97.9 F (36.6 C)-98.2 F (36.8 C)] 98.2 F (36.8 C) (05/22 0456) Pulse Rate:  [72-87] 76 (05/22 0456) Resp:  [17-18] 17 (05/22 0456) BP: (91-113)/(48-69) 100/51 (05/22 0600) SpO2:  [94 %-100 %] 94 % (05/22 0456) Weight:  [104.4 kg] 104.4 kg (05/22 0456)     Height: 4\' 11"  (149.9 cm) Weight: 104.4 kg BMI (Calculated): 46.02   Intake/Output last 2 shifts:  05/21 0701 - 05/22 0700 In: 1345 [P.O.:480; I.V.:865] Out: 100 [Stool:100]   Physical Exam:  Constitutional: alert, cooperative and no distress  Respiratory: breathing non-labored at rest  Cardiovascular: regular rate and sinus rhythm  Gastrointestinal: Soft, non-tender, non-distended, no rebound/guarding. Integumentary: Midline wound healing by secondary intention, peritoneum closed; granulating and new skin growing over majority of wound now; there are three stomatized areas visible in the LUQ and LLQ portions of the wound. No active bleeding Chest: Port to the right chest; no further swelling or drainage; no evidence of infection   Labs:     Latest Ref Rng & Units 01/27/2023     7:44 AM 01/19/2023    5:48 AM 01/13/2023    6:33 AM  CBC  WBC 4.0 - 10.5 K/uL 2.4  2.8  3.2   Hemoglobin 12.0 - 15.0 g/dL 7.5  7.4  7.5   Hematocrit 36.0 - 46.0 % 24.0  23.5  22.7   Platelets 150 - 400 K/uL 73  83  80       Latest Ref Rng & Units 01/26/2023    6:24 AM 01/19/2023    5:48 AM 01/12/2023    8:33 AM  CMP  Glucose 70 - 99 mg/dL 409  811  914   BUN 6 - 20 mg/dL 17  18  23    Creatinine 0.44 - 1.00 mg/dL 7.82  9.56  2.13   Sodium 135 - 145 mmol/L 137  136  136   Potassium 3.5 - 5.1 mmol/L 3.9  3.7  4.7   Chloride 98 - 111 mmol/L 109  107  108   CO2 22 - 32 mmol/L 23  25  24    Calcium 8.9 - 10.3 mg/dL 8.0  8.1  7.8   Total Protein 6.5 - 8.1 g/dL 5.5  5.8  5.2   Total Bilirubin 0.3 - 1.2 mg/dL 0.8  0.6  0.6   Alkaline Phos 38 - 126 U/L 122  122  101   AST 15 - 41 U/L 44  44  35   ALT 0 - 44 U/L 28  30  23      Imaging studies: No new imaging studies this AM   Assessment/Plan:  57 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  - New 05/22: No acute changes  - Wound Care ([name] Pouch); Now on smaller [name] pouch; continues managing effluent independently. Intermittent oozing from time to time. Change as needed.    - Continue soft diet as tolerated  - Continue cyclic TPN; weekly nutritional labs - Appreciate dietary assistance - monitoring weight; now on daily Lasix as of 05/20             - Monitor abdominal examination; on-going bowel function            - Pain control prn; antemetic prn            - Progressed with therapies; no longer any recommendations; ambulating well    - Discharge Planning: Continue to anticipate lengthy admission +/-  possible transfer; no new progress regarding disposition unfortunately   All of the above findings and recommendations were discussed with the patient, and the medical team, and all of patient's questions were answered to her expressed satisfaction.  -- [name], PA-C [facility] 01/28/2023, 7:19 AM M-F: 7am - 4pm

## 2023-01-28 NOTE — Progress Notes (Signed)
PHARMACY - TOTAL PARENTERAL NUTRITION CONSULT NOTE   Indication: Prolonged ileus  Patient Measurements: Height: 4\' 11"  (149.9 cm) Weight: 104.4 kg (230 lb 2.6 oz) IBW/kg (Calculated) : 43.2 TPN AdjBW (KG): 57.8 Body mass index is 46.49 kg/m.  Assessment: Debra [name] is a 57 y.o. female s/p laparotomy, excision of greater omental mass, abdominal wall reconstruction with [name], appendectomy, and placement of Prevena vac. New PICC was replaced 4/12.   Glucose / Insulin:  --No apparent history of diabetes --BG controlled on current regimen --mSSI 4x/day (0500, 1300, 1800, 2200) --SSI last 24h: 10 units (up from 6) Electrolytes: WNL Triglycerides WNL Renal: SCr stable at baseline. Furosemide 20 mg IV daily added on 5/20 Hepatic: LFTs, bilirubin WNL GI Imaging: 9/11 CTAP: no new acute issues GI Surgeries / Procedures: s/p laparotomy, excision of greater omental mass, abdominal wall reconstruction with [name], appendectomy, and placement of Prevena vac 12/19/22 placement of right IJ port-a-catheter with dual reservoirs also s/p re-opening of laparotomy for repair of small bowel perforation   Central access: 02/15/22 TPN start date: 02/15/22  Nutritional Goals: Goal cyclic TPN over 16 hrs: cyclic TPN over 16 hrs: (provides 102 g of protein and 1811 kcals per day) total volume over 16hrs for calculations=1400 ml (1500 ml total with overfill)  RD Assessment:  Estimated Needs Total Energy Estimated Needs: 1800-2100kcal/day Total Protein Estimated Needs: 90-110g/d Total Fluid Estimated Needs: 1.4-1.6L/day  Current Nutrition:  Soft diet + nutritional supplements, not meeting PO needs  Plan: Transitioned to *Cyclic* TPN on 5/62/13.   to run over 16 hours per MD request. [name] at 2000 per discussion with dietician to allow patient time for walking in afternoon/evenings.  To run over 16 hours: -Start rate at 49 mL/hr for 1 hour. -Increase rate to 98  mL/hr for 13 hours.  -Decrease rate to 49 mL/hr for 1 hour. -Decrease rate to 25 mL/hr for 1 hour, then stop.  Plan:  See cyclic TPN rate above Nutritional Components Amino acids (using 15% Clinisol): 101 g Dextrose 19% = 266 g Lipids (using 20% SMOFlipids): 50.4 g kCal: 1817/24h  Electrolytes in TPN: Na 75 mEq/L, K 9mEq/L, Ca 10mEq/L, Mg 10 mEq/L, Phos 10 mmol/L  Continue ratio Cl:Ac 1:1  Adding back trace elements, MVI, chromium, zinc(increased to 7g on 5/16) and selenium(increased to 100mg  on 5/16) per dietary recs  Continue CBG/SSI for Cyclic TPN:  -CBG 2 hrs after cyclic TPN start -CBG during middle of cyclic TPN infusion -CBG 1 hr after cylic TPN stopped -CBG while off TPN Continue SSI insulin at moderate scale and remove insulin from TPN bag starting 01/06/23 due to low BG. Will consider re-adding if insulin usage becomes >10 units over 24h. 5/22: insulin usage 10 u past 24 h. Will continue to monitor Check TPN labs on Mondays (CMP, phos, Mg, trig) BMP ordered for 5/22 but not colleced. Re-ordered for 5/23 AM due to recent addition of furosemide 20 mg IV daily.  Will M. Dareen Piano, PharmD PGY-1 Pharmacy Resident 01/28/2023 9:42 AM

## 2023-01-29 LAB — BASIC METABOLIC PANEL
Anion gap: 5 (ref 5–15)
BUN: 16 mg/dL (ref 6–20)
CO2: 26 mmol/L (ref 22–32)
Calcium: 7.8 mg/dL — ABNORMAL LOW (ref 8.9–10.3)
Chloride: 106 mmol/L (ref 98–111)
Creatinine, Ser: 0.46 mg/dL (ref 0.44–1.00)
GFR, Estimated: >60 mL/min (ref >60)
Glucose, Bld: 187 mg/dL — ABNORMAL HIGH (ref 70–99)
Potassium: 3.5 mmol/L (ref 3.5–5.1)
Sodium: 137 mmol/L (ref 135–145)

## 2023-01-29 LAB — GLUCOSE, CAPILLARY
Glucose-Capillary: 187 mg/dL — ABNORMAL HIGH (ref 70–99)
Glucose-Capillary: 112 mg/dL — ABNORMAL HIGH (ref 70–99)
Glucose-Capillary: 141 mg/dL — ABNORMAL HIGH (ref 70–99)

## 2023-01-29 MED ORDER — ZINC CHLORIDE 1 MG/ML IV SOLN
INTRAVENOUS | Status: AC
  Filled 2023-01-29: qty 672

## 2023-01-29 NOTE — Plan of Care (Signed)
  Problem: Clinical Measurements: Goal: Will remain free from infection Outcome: Progressing   Problem: Nutrition: Goal: Adequate nutrition will be maintained Outcome: Progressing   Problem: Activity: Goal: Risk for activity intolerance will decrease Outcome: Progressing   Problem: Nutrition: Goal: Adequate nutrition will be maintained Outcome: Progressing   Problem: Coping: Goal: Level of anxiety will decrease Outcome: Progressing   Problem: Pain Managment: Goal: General experience of comfort will improve Outcome: Progressing   

## 2023-01-29 NOTE — Progress Notes (Addendum)
PHARMACY - TOTAL PARENTERAL NUTRITION CONSULT NOTE   Indication: Prolonged ileus  Patient Measurements: Height: 4\' 11"  (149.9 cm) Weight: 104.4 kg (230 lb 2.6 oz) IBW/kg (Calculated) : 43.2 TPN AdjBW (KG): 57.8 Body mass index is 46.49 kg/m.  Assessment: Debra [name] is a 57 y.o. female s/p laparotomy, excision of greater omental mass, abdominal wall reconstruction with [name], appendectomy, and placement of Prevena vac. New PICC was replaced 4/12.   Glucose / Insulin:  --No apparent history of diabetes --BG controlled on current regimen --mSSI 4x/day (0500, 1300, 1800, 2200) --SSI last 24h: 5 units (down from 10) Electrolytes: WNL. Potassium at low end of normal so will check potassium with AM labs tomorrow given IV furosemide. Triglycerides WNL Renal: SCr stable at baseline.  --Furosemide 20 mg IV daily added on 5/20. Hepatic: LFTs, bilirubin WNL GI Imaging: 9/11 CTAP: no new acute issues GI Surgeries / Procedures: s/p laparotomy, excision of greater omental mass, abdominal wall reconstruction with [name], appendectomy, and placement of Prevena vac 12/19/22 placement of right IJ port-a-catheter with dual reservoirs also s/p re-opening of laparotomy for repair of small bowel perforation   Central access: 02/15/22 TPN start date: 02/15/22  Nutritional Goals: Goal cyclic TPN over 16 hrs: cyclic TPN over 16 hrs: (provides 102 g of protein and 1811 kcals per day) total volume over 16hrs for calculations=1400 ml (1500 ml total with overfill)  RD Assessment:  Estimated Needs Total Energy Estimated Needs: 1800-2100kcal/day Total Protein Estimated Needs: 90-110g/d Total Fluid Estimated Needs: 1.4-1.6L/day  Current Nutrition:  Soft diet + nutritional supplements, not meeting PO needs  Plan: Transitioned to *Cyclic* TPN on 1/61/09.   to run over 16 hours per MD request. [name] at 2000 per discussion with dietician to allow patient time for walking  in afternoon/evenings.  To run over 16 hours: -Start rate at 49 mL/hr for 1 hour. -Increase rate to 98 mL/hr for 13 hours.  -Decrease rate to 49 mL/hr for 1 hour. -Decrease rate to 25 mL/hr for 1 hour, then stop.  Plan:  See cyclic TPN rate above Nutritional Components Amino acids (using 15% Clinisol): 101 g Dextrose 19% = 266 g Lipids (using 20% SMOFlipids): 50.4 g kCal: 1817/24h  Electrolytes in TPN: Na 75 mEq/L, K 97mEq/L, Ca 35mEq/L, Mg 10 mEq/L, Phos 10 mmol/L  Continue ratio Cl:Ac 1:1  Adding back trace elements, MVI, chromium, zinc(increased to 7g on 5/16) and selenium(increased to 100mg  on 5/16) per dietary recs  Continue CBG/SSI for Cyclic TPN:  -CBG 2 hrs after cyclic TPN start -CBG during middle of cyclic TPN infusion -CBG 1 hr after cylic TPN stopped -CBG while off TPN Continue SSI insulin at moderate scale and remove insulin from TPN bag starting 01/06/23 due to low BG. Will consider re-adding if insulin usage becomes >10 units over 24h Check TPN labs on Mondays (CMP, phos, Mg, trig)  Will M. Dareen Piano, PharmD PGY-1 Pharmacy Resident 01/29/2023 12:40 PM

## 2023-01-29 NOTE — Progress Notes (Signed)
Blood sugar 187, did not cross over.

## 2023-01-29 NOTE — Progress Notes (Signed)
[name] SURGICAL PROGRESS NOTE   Hospital Day(s): 350.   Post op day(s): 41 Days Post-Op.   Interval History:  Patient seen and examined No issues overnight; Hemodynamically stable - BP soft BMP this morning stable No abdominal pain, nausea, emesis, fever [name] output unmeasured in last 24 hours; managing effluent independently No bleeding noted On cyclic TPN + soft diet  Ambulating well; independent  Review of Systems:  Constitutional: denies fever, chills  HEENT: denies cough or congestion  Respiratory: denies any shortness of breath  Cardiovascular: denies chest pain or palpitations  Gastrointestinal: denied abdominal pain, denied N/V Genitourinary: denies burning with urination or urinary frequency Integumentary: + midline wound (healing; stable)  Vital signs in last 24 hours: [min-max] current  Temp:  [98.2 F (36.8 C)] 98.2 F (36.8 C) (05/23 0324) Pulse Rate:  [73-75] 73 (05/23 0324) Resp:  [16-17] 17 (05/23 0324) BP: (118-120)/(68-72) 120/72 (05/23 0324) SpO2:  [100 %] 100 % (05/23 0324)     Height: 4\' 11"  (149.9 cm) Weight: 104.4 kg BMI (Calculated): 46.02   Intake/Output last 2 shifts:  05/22 0701 - 05/23 0700 In: 992.7 [P.O.:240; I.V.:752.7] Out: -    Physical Exam:  Constitutional: alert, cooperative and no distress  Respiratory: breathing non-labored at rest  Cardiovascular: regular rate and sinus rhythm  Gastrointestinal: Soft, non-tender, non-distended, no rebound/guarding. Integumentary: Midline wound healing by secondary intention, peritoneum closed; granulating and new skin growing over majority of wound now; there are three stomatized areas visible in the LUQ and LLQ portions of the wound. No active bleeding Chest: Port to the right chest; no further swelling or drainage; no evidence of infection   Labs:     Latest Ref Rng & Units 01/27/2023    7:44 AM 01/19/2023    5:48 AM 01/13/2023    6:33 AM  CBC  WBC 4.0 - 10.5 K/uL 2.4   2.8  3.2   Hemoglobin 12.0 - 15.0 g/dL 7.5  7.4  7.5   Hematocrit 36.0 - 46.0 % 24.0  23.5  22.7   Platelets 150 - 400 K/uL 73  83  80       Latest Ref Rng & Units 01/29/2023    6:00 AM 01/26/2023    6:24 AM 01/19/2023    5:48 AM  CMP  Glucose 70 - 99 mg/dL 161  096  045   BUN 6 - 20 mg/dL 16  17  18    Creatinine 0.44 - 1.00 mg/dL 4.09  8.11  9.14   Sodium 135 - 145 mmol/L 137  137  136   Potassium 3.5 - 5.1 mmol/L 3.5  3.9  3.7   Chloride 98 - 111 mmol/L 106  109  107   CO2 22 - 32 mmol/L 26  23  25    Calcium 8.9 - 10.3 mg/dL 7.8  8.0  8.1   Total Protein 6.5 - 8.1 g/dL  5.5  5.8   Total Bilirubin 0.3 - 1.2 mg/dL  0.8  0.6   Alkaline Phos 38 - 126 U/L  122  122   AST 15 - 41 U/L  44  44   ALT 0 - 44 U/L  28  30     Imaging studies: No new imaging studies this AM   Assessment/Plan:  57 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  - New 05/23: No acute changes; BMP stable  - Wound Care ([name] Pouch); Now on smaller [name] pouch; continues managing effluent independently. Intermittent oozing from time to time. Change as needed.    - Continue soft diet as tolerated  - Continue cyclic TPN; weekly nutritional labs - Appreciate dietary assistance - monitoring weight; now on daily Lasix as of 05/20             - Monitor abdominal examination; on-going bowel function            - Pain control prn; antemetic prn            - Progressed with therapies; no longer any recommendations; ambulating well    - Discharge Planning: Continue to anticipate lengthy admission +/- possible transfer; no new progress regarding disposition unfortunately   All of the above  findings and recommendations were discussed with the patient, and the medical team, and all of patient's questions were answered to her expressed satisfaction.  -- [name], PA-C [name] 01/29/2023, 7:30 AM M-F: 7am - 4pm

## 2023-01-30 LAB — GLUCOSE, CAPILLARY
Glucose-Capillary: 167 mg/dL — ABNORMAL HIGH (ref 70–99)
Glucose-Capillary: 175 mg/dL — ABNORMAL HIGH (ref 70–99)

## 2023-01-30 LAB — POTASSIUM
Potassium: 3 mmol/L — ABNORMAL LOW (ref 3.5–5.1)
Potassium: 3.2 mmol/L — ABNORMAL LOW (ref 3.5–5.1)

## 2023-01-30 MED ORDER — POTASSIUM CHLORIDE CRYS ER 20 MEQ PO TBCR
40.0000 meq | EXTENDED_RELEASE_TABLET | Freq: Once | ORAL | Status: AC
  Administered 2023-01-30: 40 meq via ORAL
  Filled 2023-01-30: qty 2

## 2023-01-30 MED ORDER — ZINC CHLORIDE 1 MG/ML IV SOLN
INTRAVENOUS | Status: AC
  Filled 2023-01-30: qty 672

## 2023-01-30 NOTE — Progress Notes (Signed)
   01/30/23 1600  Spiritual Encounters  Type of Visit Follow up  Care provided to: Patient  Reason for visit Routine spiritual support  OnCall Visit Yes   Chaplain provided on-going support to long-term patient.

## 2023-01-30 NOTE — Progress Notes (Addendum)
PHARMACY - TOTAL PARENTERAL NUTRITION CONSULT NOTE   Indication: Prolonged ileus  Patient Measurements: Height: 4\' 11"  (149.9 cm) Weight: 102.9 kg (226 lb 13.7 oz) IBW/kg (Calculated) : 43.2 TPN AdjBW (KG): 57.8 Body mass index is 45.82 kg/m.  Assessment: Debra [name] is a 57 y.o. female s/p laparotomy, excision of greater omental mass, abdominal wall reconstruction with [name], appendectomy, and placement of Prevena vac. New PICC was replaced 4/12.   Glucose / Insulin:  --No apparent history of diabetes --BG controlled on current regimen --mSSI 4x/day (0500, 1300, 1800, 2200) --SSI last 24h: 5 units (same as yesterday) Electrolytes: WNL. Potassium at low end of normal and potassium was not collected as ordered >> will retime potassium check at 1400 which will be after this morning's dose of lasix. Triglycerides WNL Renal: SCr stable at baseline.  --Furosemide 20 mg IV daily added on 5/20. Hepatic: LFTs, bilirubin WNL GI Imaging: 9/11 CTAP: no new acute issues GI Surgeries / Procedures: s/p laparotomy, excision of greater omental mass, abdominal wall reconstruction with [name], appendectomy, and placement of Prevena vac 12/19/22 placement of right IJ port-a-catheter with dual reservoirs also s/p re-opening of laparotomy for repair of small bowel perforation   Central access: 02/15/22 TPN start date: 02/15/22  Nutritional Goals: Goal cyclic TPN over 16 hrs: cyclic TPN over 16 hrs: (provides 102 g of protein and 1811 kcals per day) total volume over 16hrs for calculations=1400 ml (1500 ml total with overfill)  RD Assessment:  Estimated Needs Total Energy Estimated Needs: 1800-2100kcal/day Total Protein Estimated Needs: 90-110g/d Total Fluid Estimated Needs: 1.4-1.6L/day  Current Nutrition:  Soft diet + nutritional supplements, not meeting PO needs  Plan: Transitioned to *Cyclic* TPN on 2/84/13.   to run over 16 hours per MD request. [name]  at 2000 per discussion with dietician to allow patient time for walking in afternoon/evenings.  To run over 16 hours: -Start rate at 49 mL/hr for 1 hour. -Increase rate to 98 mL/hr for 13 hours.  -Decrease rate to 49 mL/hr for 1 hour. -Decrease rate to 25 mL/hr for 1 hour, then stop.  Plan:  See cyclic TPN rate above Nutritional Components Amino acids (using 15% Clinisol): 101 g Dextrose 19% = 266 g Lipids (using 20% SMOFlipids): 50.4 g kCal: 1817/24h  Electrolytes in TPN: Na 75 mEq/L, K 69mEq/L, Ca 88mEq/L, Mg 10 mEq/L, Phos 10 mmol/L  Continue ratio Cl:Ac 1:1  Adding back trace elements, MVI, chromium, zinc(increased to 7g on 5/16) and selenium(increased to 100mg  on 5/16) per dietary recs  Continue CBG/SSI for Cyclic TPN:  -CBG 2 hrs after cyclic TPN start -CBG during middle of cyclic TPN infusion -CBG 1 hr after cylic TPN stopped -CBG while off TPN Continue SSI insulin at moderate scale and remove insulin from TPN bag starting 01/06/23 due to low BG. Will consider re-adding if insulin usage becomes >10 units over 24h Check TPN labs on Mondays (CMP, phos, Mg, trig)  Will M. Dareen Piano, PharmD PGY-1 Pharmacy Resident 01/30/2023 9:08 AM

## 2023-01-30 NOTE — Progress Notes (Signed)
[facility] SURGICAL PROGRESS NOTE   Hospital Day(s): 351.   Post op day(s): 42 Days Post-Op.   Interval History:  Patient seen and examined No issues overnight; Hemodynamically stable Sleeping comfortably  No new labs this morning No abdominal pain, nausea, emesis, fever [name] output unmeasured in last 24 hours; managing effluent independently No bleeding noted On cyclic TPN + soft diet  Ambulating well; independent  Review of Systems:  Constitutional: denies fever, chills  HEENT: denies cough or congestion  Respiratory: denies any shortness of breath  Cardiovascular: denies chest pain or palpitations  Gastrointestinal: denied abdominal pain, denied N/V Genitourinary: denies burning with urination or urinary frequency Integumentary: + midline wound (healing; stable)  Vital signs in last 24 hours: [min-max] current  Temp:  [98.2 F (36.8 C)-98.6 F (37 C)] 98.2 F (36.8 C) (05/24 0504) Pulse Rate:  [75-91] 85 (05/24 0504) Resp:  [16-18] 17 (05/24 0504) BP: (98-124)/(57-81) 118/79 (05/24 0504) SpO2:  [94 %-100 %] 100 % (05/24 0504) Weight:  [102.9 kg] 102.9 kg (05/24 0500)     Height: 4\' 11"  (149.9 cm) Weight: 102.9 kg BMI (Calculated): 46.02   Intake/Output last 2 shifts:  05/23 0701 - 05/24 0700 In: 600 [P.O.:600] Out: -    Physical Exam:  Constitutional: alert, cooperative and no distress  Respiratory: breathing non-labored at rest  Cardiovascular: regular rate and sinus rhythm  Gastrointestinal: Soft, non-tender, non-distended, no rebound/guarding. Integumentary: Midline wound healing by secondary intention, peritoneum closed; granulating and new skin growing over majority of wound now; there are three stomatized areas visible in the LUQ and LLQ portions of the wound. No active bleeding Chest: Port to the right chest; no further swelling or drainage; no evidence of infection   Labs:     Latest Ref Rng & Units 01/27/2023    7:44 AM 01/19/2023     5:48 AM 01/13/2023    6:33 AM  CBC  WBC 4.0 - 10.5 K/uL 2.4  2.8  3.2   Hemoglobin 12.0 - 15.0 g/dL 7.5  7.4  7.5   Hematocrit 36.0 - 46.0 % 24.0  23.5  22.7   Platelets 150 - 400 K/uL 73  83  80       Latest Ref Rng & Units 01/29/2023    6:00 AM 01/26/2023    6:24 AM 01/19/2023    5:48 AM  CMP  Glucose 70 - 99 mg/dL 161  096  045   BUN 6 - 20 mg/dL 16  17  18    Creatinine 0.44 - 1.00 mg/dL 4.09  8.11  9.14   Sodium 135 - 145 mmol/L 137  137  136   Potassium 3.5 - 5.1 mmol/L 3.5  3.9  3.7   Chloride 98 - 111 mmol/L 106  109  107   CO2 22 - 32 mmol/L 26  23  25    Calcium 8.9 - 10.3 mg/dL 7.8  8.0  8.1   Total Protein 6.5 - 8.1 g/dL  5.5  5.8   Total Bilirubin 0.3 - 1.2 mg/dL  0.8  0.6   Alkaline Phos 38 - 126 U/L  122  122   AST 15 - 41 U/L  44  44   ALT 0 - 44 U/L  28  30     Imaging studies: No new imaging studies this AM   Assessment/Plan:  57 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  - New 05/24: No acute issues this morning   - Wound Care ([name] Pouch); Now on smaller [name] pouch; continues managing effluent independently. Intermittent oozing from time to time. Change as needed.    - Continue soft diet as tolerated  - Continue cyclic TPN; weekly nutritional labs - Appreciate dietary assistance - monitoring weight; now on daily Lasix as of 05/20             - Monitor abdominal examination; on-going bowel function            - Pain control prn; antemetic prn            - Progressed with therapies; no longer any recommendations; ambulating well    - Discharge Planning: Continue to anticipate lengthy admission +/- possible transfer; no new  progress regarding disposition unfortunately   All of the above findings and recommendations were discussed with the patient, and the medical team, and all of patient's questions were answered to her expressed satisfaction.  -- [name], PA-C [facility] 01/30/2023, 7:25 AM M-F: 7am - 4pm

## 2023-01-31 LAB — GLUCOSE, CAPILLARY
Glucose-Capillary: 80 mg/dL (ref 70–99)
Glucose-Capillary: 237 mg/dL — ABNORMAL HIGH (ref 70–99)

## 2023-01-31 MED ORDER — ZINC CHLORIDE 1 MG/ML IV SOLN
INTRAVENOUS | Status: AC
  Filled 2023-01-31: qty 672

## 2023-01-31 NOTE — Progress Notes (Signed)
Patient seen and examined no new issues.  Walking in the halls.  [name] bag  changed. Continue TPN and wound care

## 2023-01-31 NOTE — Progress Notes (Signed)
PHARMACY - TOTAL PARENTERAL NUTRITION CONSULT NOTE   Indication: Prolonged ileus  Patient Measurements: Height: 4\' 11"  (149.9 cm) Weight: 102.9 kg (226 lb 13.7 oz) IBW/kg (Calculated) : 43.2 TPN AdjBW (KG): 57.8 Body mass index is 45.82 kg/m.  Assessment: Debra [name] is a 57 y.o. female s/p laparotomy, excision of greater omental mass, abdominal wall reconstruction with [name], appendectomy, and placement of Prevena vac. New PICC was replaced 4/12.   Glucose / Insulin:  --No apparent history of diabetes --BG controlled on current regimen --mSSI 4x/day (0500, 1300, 1800, 2200) --SSI last 24h: 9 units (same as yesterday) Electrolytes: WNL. Potassium at low end of normal and potassium was not collected as ordered >> will retime potassium check at 1400 which will be after this morning's dose of lasix. Triglycerides WNL Renal: SCr stable at baseline.  --Furosemide 20 mg IV daily added on 5/20. Hepatic: LFTs, bilirubin WNL GI Imaging: 9/11 CTAP: no new acute issues GI Surgeries / Procedures: s/p laparotomy, excision of greater omental mass, abdominal wall reconstruction with [name], appendectomy, and placement of Prevena vac 12/19/22 placement of right IJ port-a-catheter with dual reservoirs also s/p re-opening of laparotomy for repair of small bowel perforation   Central access: 02/15/22 TPN start date: 02/15/22  Nutritional Goals: Goal cyclic TPN over 16 hrs: cyclic TPN over 16 hrs: (provides 102 g of protein and 1811 kcals per day) total volume over 16hrs for calculations=1400 ml (1500 ml total with overfill)  RD Assessment:  Estimated Needs Total Energy Estimated Needs: 1800-2100kcal/day Total Protein Estimated Needs: 90-110g/d Total Fluid Estimated Needs: 1.4-1.6L/day  Current Nutrition:  Soft diet + nutritional supplements, not meeting PO needs  Plan: Transitioned to *Cyclic* TPN on 1/61/09.   to run over 16 hours per MD request. [name]  at 2000 per discussion with dietician to allow patient time for walking in afternoon/evenings.  To run over 16 hours: -Start rate at 49 mL/hr for 1 hour. -Increase rate to 98 mL/hr for 13 hours.  -Decrease rate to 49 mL/hr for 1 hour. -Decrease rate to 25 mL/hr for 1 hour, then stop.  Plan:  See cyclic TPN rate above Nutritional Components Amino acids (using 15% Clinisol): 101 g Dextrose 19% = 266 g Lipids (using 20% SMOFlipids): 50.4 g kCal: 1817/24h  Electrolytes in TPN: Na 75 mEq/L, K 40 > 77mEq/L, Ca 67mEq/L, Mg 10 mEq/L, Phos 10 mmol/L  Continue ratio Cl:Ac 1:1  Adding back trace elements, MVI, chromium, zinc(increased to 7g on 5/16) and selenium(increased to 100mg  on 5/16) per dietary recs  Continue CBG/SSI for Cyclic TPN:  -CBG 2 hrs after cyclic TPN start -CBG during middle of cyclic TPN infusion -CBG 1 hr after cylic TPN stopped -CBG while off TPN Continue SSI insulin at moderate scale and remove insulin from TPN bag starting 01/06/23 due to low BG. Will consider re-adding if insulin usage becomes >10 units over 24h Check TPN labs on Mondays (CMP, phos, Mg, trig)  Paschal Dopp, PharmD 01/31/2023 8:40 AM

## 2023-02-01 LAB — PHOSPHORUS: Phosphorus: 2.5 mg/dL (ref 2.5–4.6)

## 2023-02-01 LAB — BASIC METABOLIC PANEL
Anion gap: 3 — ABNORMAL LOW (ref 5–15)
BUN: 23 mg/dL — ABNORMAL HIGH (ref 6–20)
CO2: 26 mmol/L (ref 22–32)
Calcium: 8.1 mg/dL — ABNORMAL LOW (ref 8.9–10.3)
Chloride: 108 mmol/L (ref 98–111)
Creatinine, Ser: 0.52 mg/dL (ref 0.44–1.00)
GFR, Estimated: >60 mL/min (ref >60)
Glucose, Bld: 214 mg/dL — ABNORMAL HIGH (ref 70–99)
Potassium: 3.8 mmol/L (ref 3.5–5.1)
Sodium: 137 mmol/L (ref 135–145)

## 2023-02-01 LAB — MAGNESIUM: Magnesium: 2.2 mg/dL (ref 1.7–2.4)

## 2023-02-01 LAB — GLUCOSE, CAPILLARY: Glucose-Capillary: 142 mg/dL — ABNORMAL HIGH (ref 70–99)

## 2023-02-01 MED ORDER — ZINC CHLORIDE 1 MG/ML IV SOLN
INTRAVENOUS | Status: AC
  Filled 2023-02-01: qty 672

## 2023-02-01 NOTE — Progress Notes (Signed)
CC: EC FISTULA Subjective: Doing well, ambulating w walker [name] changed to dya No bleeding  Objective: Vital signs in last 24 hours: Temp:  [98.4 F (36.9 C)-99 F (37.2 C)] 99 F (37.2 C) (05/26 0431) Pulse Rate:  [84-87] 84 (05/26 0800) Resp:  [16-20] 18 (05/26 0800) BP: (105-130)/(57-79) 112/64 (05/26 0800) SpO2:  [98 %-100 %] 98 % (05/26 0431) Weight:  [104 kg] 104 kg (05/26 0431) Last BM Date : 01/31/23  Intake/Output from previous day: 05/25 0701 - 05/26 0700 In: 410.3 [I.V.:410.3] Out: 350 [Stool:350] Intake/Output this shift: Total I/O In: 951 [I.V.:951] Out: -   Physical exam: Constitutional: alert, cooperative and no distress Gastrointestinal: Soft, non-tender, non-distended, no rebound/guarding. Integumentary: Midline wound healing by secondary intention, peritoneum closed; granulating and new skin growing over majority of wound now; there are three stomatized areas visible in the LUQ and LLQ portions of the wound. No active bleeding Chest: Port to the right chest; no further swelling or drainage; no evidence of infection  Lab Results: CBC  No results for input(s): "WBC", "HGB", "HCT", "PLT" in the last 72 hours. BMET Recent Labs    01/30/23 2207 02/01/23 0500  NA  --  137  K 3.2* 3.8  CL  --  108  CO2  --  26  GLUCOSE  --  214*  BUN  --  23*  CREATININE  --  0.52  CALCIUM  --  8.1*   PT/INR No results for input(s): "LABPROT", "INR" in the last 72 hours. ABG No results for input(s): "PHART", "HCO3" in the last 72 hours.  Invalid input(s): "PCO2", "PO2"  Studies/Results: No results found.  Anti-infectives: Anti-infectives (From admission, onward)    Start     Dose/Rate Route Frequency Ordered Stop   12/19/22 1000  ceFAZolin (ANCEF) IVPB 2g/100 mL premix        2 g 200 mL/hr over 30 Minutes Intravenous  Once 12/18/22 1738 12/19/22 1141   09/17/22 1200  vancomycin (VANCOREADY) IVPB 1250 mg/250 mL  Status:  Discontinued        1,250  mg 166.7 mL/hr over 90 Minutes Intravenous Every 24 hours 09/16/22 1040 09/22/22 1139   09/16/22 1200  vancomycin (VANCOREADY) IVPB 1250 mg/250 mL        1,250 mg 166.7 mL/hr over 90 Minutes Intravenous  Once 09/15/22 1706 09/16/22 1255   09/15/22 1200  ceFEPIme (MAXIPIME) 2 g in sodium chloride 0.9 % 100 mL IVPB  Status:  Discontinued        2 g 200 mL/hr over 30 Minutes Intravenous Every 8 hours 09/15/22 1024 09/18/22 1026   09/15/22 1200  vancomycin (VANCOREADY) IVPB 2000 mg/400 mL        2,000 mg 200 mL/hr over 120 Minutes Intravenous  Once 09/15/22 1024 09/15/22 1500   03/04/22 2200  piperacillin-tazobactam (ZOSYN) IVPB 3.375 g        3.375 g 12.5 mL/hr over 240 Minutes Intravenous Every 8 hours 03/04/22 2005 03/18/22 0248   02/21/22 1500  vancomycin (VANCOREADY) IVPB 1250 mg/250 mL  Status:  Discontinued        1,250 mg 166.7 mL/hr over 90 Minutes Intravenous Every 24 hours 02/21/22 1111 02/24/22 1336   02/21/22 1400  meropenem (MERREM) 1 g in sodium chloride 0.9 % 100 mL IVPB        1 g 200 mL/hr over 30 Minutes Intravenous Every 8 hours 02/21/22 1258 02/27/22 2157   02/20/22 1500  vancomycin (VANCOCIN) IVPB 1000 mg/200 mL premix  Status:  Discontinued        1,000 mg 200 mL/hr over 60 Minutes Intravenous Every 24 hours 02/19/22 1706 02/21/22 1111   02/19/22 1115  vancomycin (VANCOREADY) IVPB 2000 mg/400 mL        2,000 mg 200 mL/hr over 120 Minutes Intravenous  Once 02/19/22 1025 02/19/22 1732   02/19/22 1115  fluconazole (DIFLUCAN) IVPB 400 mg        400 mg 100 mL/hr over 120 Minutes Intravenous Every 24 hours 02/19/22 1025 03/04/22 1301   02/15/22 0730  piperacillin-tazobactam (ZOSYN) IVPB 3.375 g  Status:  Discontinued        3.375 g 12.5 mL/hr over 240 Minutes Intravenous Every 8 hours 02/15/22 0639 02/21/22 1235   02/15/22 0655  piperacillin-tazobactam (ZOSYN) 3.375 GM/50ML IVPB       Note to Pharmacy: Loretha Stapler N: cabinet override      02/15/22 0655 02/15/22 0816    02/13/22 2200  cefoTEtan (CEFOTAN) 2 g in sodium chloride 0.9 % 100 mL IVPB  Status:  Discontinued        2 g 200 mL/hr over 30 Minutes Intravenous Every 12 hours 02/13/22 1541 02/15/22 0639   02/13/22 1345  cefoTEtan (CEFOTAN) 2 g in sodium chloride 0.9 % 100 mL IVPB        2 g 200 mL/hr over 30 Minutes Intravenous  Once 02/13/22 1331 02/13/22 2146   02/13/22 1336  sodium chloride 0.9 % with cefoTEtan (CEFOTAN) ADS Med       Note to Pharmacy: Gates Rigg: cabinet override      02/13/22 1336 02/14/22 0144   02/13/22 0620  sodium chloride 0.9 % with cefoTEtan (CEFOTAN) ADS Med       Note to Pharmacy: Agnes Lawrence A: cabinet override      02/13/22 0620 02/13/22 0802   02/13/22 0600  cefoTEtan (CEFOTAN) 2 g in sodium chloride 0.9 % 100 mL IVPB        2 g 200 mL/hr over 30 Minutes Intravenous On call to O.R. 02/12/22 2229 02/13/22 2146       Assessment/Plan:  Doing well  Continue supportive care Labs tomorrow  Sterling Big, MD, [facility]  02/01/2023

## 2023-02-01 NOTE — Progress Notes (Signed)
PHARMACY - TOTAL PARENTERAL NUTRITION CONSULT NOTE   Indication: Prolonged ileus  Patient Measurements: Height: 4\' 11"  (149.9 cm) Weight: 104 kg (229 lb 4.5 oz) IBW/kg (Calculated) : 43.2 TPN AdjBW (KG): 57.8 Body mass index is 46.31 kg/m.  Assessment: Debra [name] is a 57 y.o. female s/p laparotomy, excision of greater omental mass, abdominal wall reconstruction with [name], appendectomy, and placement of Prevena vac. New PICC was replaced 4/12.   Glucose / Insulin:  --No apparent history of diabetes --BG controlled on current regimen --mSSI 4x/day (0500, 1300, 1800, 2200) --SSI last 24h: 11 units (same as yesterday) Electrolytes: WNL. Potassium at low end of normal and potassium was not collected as ordered >> will retime potassium check at 1400 which will be after this morning's dose of lasix. Triglycerides WNL Renal: SCr stable at baseline.  --Furosemide 20 mg IV daily added on 5/20. Hepatic: LFTs, bilirubin WNL GI Imaging: 9/11 CTAP: no new acute issues GI Surgeries / Procedures: s/p laparotomy, excision of greater omental mass, abdominal wall reconstruction with [name], appendectomy, and placement of Prevena vac 12/19/22 placement of right IJ port-a-catheter with dual reservoirs also s/p re-opening of laparotomy for repair of small bowel perforation   Central access: 02/15/22 TPN start date: 02/15/22  Nutritional Goals: Goal cyclic TPN over 16 hrs: cyclic TPN over 16 hrs: (provides 102 g of protein and 1811 kcals per day) total volume over 16hrs for calculations=1400 ml (1500 ml total with overfill)  RD Assessment:  Estimated Needs Total Energy Estimated Needs: 1800-2100kcal/day Total Protein Estimated Needs: 90-110g/d Total Fluid Estimated Needs: 1.4-1.6L/day  Current Nutrition:  Soft diet + nutritional supplements, not meeting PO needs  Plan: Transitioned to *Cyclic* TPN on 1/61/09.   to run over 16 hours per MD request. [name] at  2000 per discussion with dietician to allow patient time for walking in afternoon/evenings.  To run over 16 hours: -Start rate at 49 mL/hr for 1 hour. -Increase rate to 98 mL/hr for 13 hours.  -Decrease rate to 49 mL/hr for 1 hour. -Decrease rate to 25 mL/hr for 1 hour, then stop.  Plan:  See cyclic TPN rate above Nutritional Components Amino acids (using 15% Clinisol): 101 g Dextrose 19% = 266 g Lipids (using 20% SMOFlipids): 50.4 g kCal: 1817/24h  Electrolytes in TPN: Na 75 mEq/L, K 40 > 75mEq/L, Ca 36mEq/L, Mg 10 mEq/L, Phos 10 mmol/L  Continue ratio Cl:Ac 1:1  Adding back trace elements, MVI, chromium, zinc(increased to 7g on 5/16) and selenium(increased to 100mg  on 5/16) per dietary recs  Continue CBG/SSI for Cyclic TPN:  -CBG 2 hrs after cyclic TPN start -CBG during middle of cyclic TPN infusion -CBG 1 hr after cylic TPN stopped -CBG while off TPN Continue SSI insulin at moderate scale and remove insulin from TPN bag starting 01/06/23 due to low BG. Will consider re-adding if insulin usage becomes >10 units over 24h Check TPN labs on Mondays (CMP, phos, Mg, trig)  Paschal Dopp, PharmD, BCPS 02/01/2023 8:26 AM

## 2023-02-02 LAB — CBC
HCT: 26.3 % — ABNORMAL LOW (ref 36.0–46.0)
Hemoglobin: 8.3 g/dL — ABNORMAL LOW (ref 12.0–15.0)
MCH: 29.2 pg (ref 26.0–34.0)
MCHC: 31.6 g/dL (ref 30.0–36.0)
MCV: 92.6 fL (ref 80.0–100.0)
Platelets: 76 10*3/uL — ABNORMAL LOW (ref 150–400)
RBC: 2.84 MIL/uL — ABNORMAL LOW (ref 3.87–5.11)
RDW: 22.1 % — ABNORMAL HIGH (ref 11.5–15.5)
WBC: 2.3 10*3/uL — ABNORMAL LOW (ref 4.0–10.5)
nRBC: 0 % (ref 0.0–0.2)

## 2023-02-02 LAB — COMPREHENSIVE METABOLIC PANEL
ALT: 36 U/L (ref 0–44)
AST: 55 U/L — ABNORMAL HIGH (ref 15–41)
Albumin: 2.6 g/dL — ABNORMAL LOW (ref 3.5–5.0)
Alkaline Phosphatase: 138 U/L — ABNORMAL HIGH (ref 38–126)
Anion gap: 3 — ABNORMAL LOW (ref 5–15)
BUN: 24 mg/dL — ABNORMAL HIGH (ref 6–20)
CO2: 27 mmol/L (ref 22–32)
Calcium: 7.8 mg/dL — ABNORMAL LOW (ref 8.9–10.3)
Chloride: 104 mmol/L (ref 98–111)
Creatinine, Ser: 0.48 mg/dL (ref 0.44–1.00)
GFR, Estimated: >60 mL/min (ref >60)
Glucose, Bld: 208 mg/dL — ABNORMAL HIGH (ref 70–99)
Potassium: 3.8 mmol/L (ref 3.5–5.1)
Sodium: 134 mmol/L — ABNORMAL LOW (ref 135–145)
Total Bilirubin: 0.7 mg/dL (ref 0.3–1.2)
Total Protein: 5.6 g/dL — ABNORMAL LOW (ref 6.5–8.1)

## 2023-02-02 LAB — MAGNESIUM: Magnesium: 2.1 mg/dL (ref 1.7–2.4)

## 2023-02-02 LAB — PHOSPHORUS: Phosphorus: 3 mg/dL (ref 2.5–4.6)

## 2023-02-02 MED ORDER — ZINC CHLORIDE 1 MG/ML IV SOLN
INTRAVENOUS | Status: AC
  Filled 2023-02-02: qty 672

## 2023-02-02 NOTE — Progress Notes (Signed)
PHARMACY - TOTAL PARENTERAL NUTRITION CONSULT NOTE   Indication: Prolonged ileus  Patient Measurements: Height: 4\' 11"  (149.9 cm) Weight: 107 kg (235 lb 14.3 oz) IBW/kg (Calculated) : 43.2 TPN AdjBW (KG): 57.8 Body mass index is 47.64 kg/m.  Assessment: Debra [name] is a 57 y.o. female s/p laparotomy, excision of greater omental mass, abdominal wall reconstruction with [name], appendectomy, and placement of Prevena vac. New PICC was replaced 4/12.   Glucose / Insulin:  --No apparent history of diabetes --BG 142-214 --mSSI 4x/day (0500, 1300, 1800, 2200) --SSI last 24h: 10 units  Electrolytes: Na 134  Triglycerides WNL Renal: SCr stable at baseline.  --Furosemide 20 mg IV daily added on 5/20. Hepatic: LFTs, bilirubin WNL GI Imaging: 9/11 CTAP: no new acute issues GI Surgeries / Procedures: s/p laparotomy, excision of greater omental mass, abdominal wall reconstruction with [name], appendectomy, and placement of Prevena vac 12/19/22 placement of right IJ port-a-catheter with dual reservoirs also s/p re-opening of laparotomy for repair of small bowel perforation   Central access: 02/15/22 TPN start date: 02/15/22  Nutritional Goals: Goal cyclic TPN over 16 hrs: cyclic TPN over 16 hrs: (provides 102 g of protein and 1811 kcals per day) total volume over 16hrs for calculations=1400 ml (1500 ml total with overfill)  RD Assessment:  Estimated Needs Total Energy Estimated Needs: 1800-2100kcal/day Total Protein Estimated Needs: 90-110g/d Total Fluid Estimated Needs: 1.4-1.6L/day  Current Nutrition:  Soft diet + nutritional supplements, not meeting PO needs  Plan: Transitioned to *Cyclic* TPN on 12/16/79.   to run over 16 hours per MD request. [name] at 2000 per discussion with dietician to allow patient time for walking in afternoon/evenings.  To run over 16 hours: -Start rate at 49 mL/hr for 1 hour. -Increase rate to 98 mL/hr for 13 hours.   -Decrease rate to 49 mL/hr for 1 hour. -Decrease rate to 25 mL/hr for 1 hour, then stop.  Plan:  See cyclic TPN rate above Nutritional Components Amino acids (using 15% Clinisol): 101 g Dextrose 19% = 266 g Lipids (using 20% SMOFlipids): 50.4 g kCal: 1817/24h  Electrolytes in TPN: Na 75 mEq/L, K 40 > 95mEq/L, Ca 65mEq/L, Mg 10 mEq/L, Phos 10 mmol/L  Continue ratio Cl:Ac 1:1  Adding back trace elements, MVI, chromium, zinc(increased to 7g on 5/16) and selenium(increased to 100mg  on 5/16) per dietary recs  Continue CBG/SSI for Cyclic TPN:  -CBG 2 hrs after cyclic TPN start -CBG during middle of cyclic TPN infusion -CBG 1 hr after cylic TPN stopped -CBG while off TPN Continue SSI insulin at moderate scale and remove insulin from TPN bag starting 01/06/23 due to low BG. Will consider re-adding if insulin usage becomes >10 units over 24h. Borderline today 5/27(required 10 units insulin last 24 hrs)- continue to f/u Check TPN labs on Mondays (CMP, phos, Mg, trig)  Bari Mantis PharmD Clinical Pharmacist 02/02/2023

## 2023-02-02 NOTE — Progress Notes (Signed)
[facility] SURGICAL PROGRESS NOTE   Hospital Day(s): 354.   Post op day(s): 45 Days Post-Op.   Interval History:  No acute events overnight.  Reports she had a bit of oozing from the wound earlier, but has stopped.  Tolerating soft diet.  Continues on TPN.  Review of Systems:  Constitutional: denies fever, chills  HEENT: denies cough or congestion  Respiratory: denies any shortness of breath  Cardiovascular: denies chest pain or palpitations  Gastrointestinal: denied abdominal pain, denied N/V Genitourinary: denies burning with urination or urinary frequency Integumentary: + midline wound (healing; stable)  Vital signs in last 24 hours: [min-max] current  Temp:  [98.1 F (36.7 C)-98.7 F (37.1 C)] 98.7 F (37.1 C) (05/27 0759) Pulse Rate:  [73-89] 73 (05/27 0759) Resp:  [19-20] 19 (05/27 0759) BP: (107-123)/(50-68) 107/50 (05/27 0759) SpO2:  [96 %-97 %] 96 % (05/27 0759) Weight:  [161 kg] 107 kg (05/27 0423)     Height: 4\' 11"  (149.9 cm) Weight: 107 kg BMI (Calculated): 46.02   Intake/Output last 2 shifts:  05/26 0701 - 05/27 0700 In: 1556.5 [I.V.:1556.5] Out: -    Physical Exam:  Constitutional: alert, cooperative and no distress  Respiratory: breathing non-labored at rest  Cardiovascular: regular rate and sinus rhythm  Gastrointestinal: Soft, non-tender, non-distended, no rebound/guarding. Integumentary: Midline wound healing by secondary intention, peritoneum closed; granulating and new skin growing over majority of wound now; there are three stomatized areas visible in the LUQ and LLQ portions of the wound. No active bleeding noted this morning.   Labs:     Latest Ref Rng & Units 02/02/2023    5:00 AM 01/27/2023    7:44 AM 01/19/2023    5:48 AM  CBC  WBC 4.0 - 10.5 K/uL 2.3  2.4  2.8   Hemoglobin 12.0 - 15.0 g/dL 8.3  7.5  7.4   Hematocrit 36.0 - 46.0 % 26.3  24.0  23.5   Platelets 150 - 400 K/uL 76  73  83       Latest Ref Rng & Units  02/02/2023    5:00 AM 02/01/2023    5:00 AM 01/30/2023   10:07 PM  CMP  Glucose 70 - 99 mg/dL 096  045    BUN 6 - 20 mg/dL 24  23    Creatinine 4.09 - 1.00 mg/dL 8.11  9.14    Sodium 782 - 145 mmol/L 134  137    Potassium 3.5 - 5.1 mmol/L 3.8  3.8  3.2   Chloride 98 - 111 mmol/L 104  108    CO2 22 - 32 mmol/L 27  26    Calcium 8.9 - 10.3 mg/dL 7.8  8.1    Total Protein 6.5 - 8.1 g/dL 5.6     Total Bilirubin 0.3 - 1.2 mg/dL 0.7     Alkaline Phos 38 - 126 U/L 138     AST 15 - 41 U/L 55     ALT 0 - 44 U/L 36       Imaging studies: No new imaging studies this AM   Assessment/Plan:  57 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  - New 05/27:    Mild oozing noted earlier per patient, but currently  no active issues.  - Wound Care ([name] Pouch); Now on smaller [name] pouch; continues managing effluent independently. Intermittent oozing from time to time. Change as needed.    - Continue soft diet as tolerated  - Continue cyclic TPN; weekly nutritional labs - Appreciate dietary assistance - monitoring weight              - Monitor abdominal examination; on-going bowel function            - Pain control prn; antemetic prn            - Progressed with therapies; no longer any recommendations; ambulating well    I spent 35 minutes dedicated to the care of this patient on the date of this encounter to include pre-visit review of records, face-to-face time with the patient discussing diagnosis and management, and any post-visit coordination of care.  Henrene Dodge, MD

## 2023-02-03 LAB — GLUCOSE, CAPILLARY
Glucose-Capillary: 171 mg/dL — ABNORMAL HIGH (ref 70–99)
Glucose-Capillary: 182 mg/dL — ABNORMAL HIGH (ref 70–99)
Glucose-Capillary: 184 mg/dL — ABNORMAL HIGH (ref 70–99)
Glucose-Capillary: 165 mg/dL — ABNORMAL HIGH (ref 70–99)
Glucose-Capillary: 114 mg/dL — ABNORMAL HIGH (ref 70–99)
Glucose-Capillary: 147 mg/dL — ABNORMAL HIGH (ref 70–99)
Glucose-Capillary: 158 mg/dL — ABNORMAL HIGH (ref 70–99)
Glucose-Capillary: 138 mg/dL — ABNORMAL HIGH (ref 70–99)
Glucose-Capillary: 113 mg/dL — ABNORMAL HIGH (ref 70–99)
Glucose-Capillary: 133 mg/dL — ABNORMAL HIGH (ref 70–99)
Glucose-Capillary: 198 mg/dL — ABNORMAL HIGH (ref 70–99)
Glucose-Capillary: 207 mg/dL — ABNORMAL HIGH (ref 70–99)
Glucose-Capillary: 120 mg/dL — ABNORMAL HIGH (ref 70–99)
Glucose-Capillary: 193 mg/dL — ABNORMAL HIGH (ref 70–99)

## 2023-02-03 MED ORDER — ZINC CHLORIDE 1 MG/ML IV SOLN
INTRAVENOUS | Status: AC
  Filled 2023-02-03: qty 672

## 2023-02-03 NOTE — Progress Notes (Signed)
Nutrition Follow-up  DOCUMENTATION CODES:   Obesity unspecified  INTERVENTION:   Continue cyclic TPN per pharmacy (16 hrs)- provides 1811kcal/day and 102g/day protein   Ensure Enlive po BID, each supplement provides 350 kcal and 20 grams of protein.  MVI, chromium and trace elements in TPN   Triglycerides checked monthly   Daily weights   Continue Zinc additional 7mg  daily added to TPN (increased 5/16)  Continue Selenium additional daily added to TPN (increased 5/16)  Vitamin A 10,000 units po daily   Will check routine vitamin labs every 3 months, next due 7/11  NUTRITION DIAGNOSIS:   Increased nutrient needs related to wound healing, catabolic illness as evidenced by estimated needs. -ongoing   GOAL:   Patient will meet greater than or equal to 90% of their needs -met with TPN   MONITOR:   PO intake, Supplement acceptance, Labs, Weight trends, Diet advancement, I & O's, TPN  ASSESSMENT:   57 y/o female with h/o hypothyroidism, COVID 19 (3/21), kidney stones and stage 3 colon cancer (s/p left hemicolectomy 5/21 and chemotherapy) who is admitted for new pelvic mass now s/p laparotomy 6/8 (with excision of pelvic mass from greater omentum, abdominal wall reconstruction with bilateral myocutaneous flaps and mesh, incisional hernia repair, appendectomy repair and VAC placement) complicated by bowel perforation s/p reopening of recent laparotomy 6/10 (with repair of small bowel perforation, excision of mesh, placement of two phasix mesh and VAC placement). Pathology returned as metastatic adenocarcinoma. Pt with L1 compression fracture.   Pt s/p right IJ port 4/12  Pt continues to tolerate TPN well at goal rate; TPN is currently being cycled for 16 hrs via port. Triglycerides wnl and are being checked monthly. Insulin removed from TPN; will monitor blood glucoses. Volume status improving with addition of lasix; pt remains up ~33lbs since admission; PA aware. Will  check routine vitamin labs every 3 months as pt on chronic TPN (next due 7/11). Vitamins being repleted as needed. Pt continues to have poor oral intake; pt eating <25% of meals. [name] pouch in place. No discharge plan in place.   Medications reviewed and include: lasix, insulin, synthroid, protonix, vitamin A, TPN  -Selenium- 86(L), zinc- 45 wnl- 5/13 -Chromium- 2.0 wnl, copper- 109 wnl, Manganese- 17.9 wnl, vitamin D 74.99 wnl, Iodine 70 wnl, aluminum 10(H)- 4/12 -Iron 25(L), TIBC 398, folate 29.0 wnl, B12 922(H)- 4/12 -Ferritin 14- 5/13 -Vitamin B1- 73.2 wnl, B6- 8.9 wnl, vitamin C- 0.9 wnl, vitamin E- 12.6 wnl, vitamin K- 0.49 wnl- 4/12 Vitamin A- 14.1(L)- 5/13 -Carnitine 32.0 wnl- 4/12  Labs reviewed: Na 134(L), K 3.8 wnl, BUN 24(H), P 3.0 wnl, Mg 2.1 wnl- 5/27 Triglycerides- 76- 5/6 Wbc- 2.3(L), Hgb 8.3(L), Hct 26.3(L)- 5/27 Cbgs- 207, 198 x 48 hrs  Diet Order:    Diet Order             DIET SOFT Fluid consistency: Thin  Diet effective now                  EDUCATION NEEDS:   Not appropriate for education at this time  Skin:  Skin Assessment: Reviewed RN Assessment (Midline wound with EC fistula)  Last BM:  5/27  Height:   Ht Readings from Last 1 Encounters:  12/19/22 4\' 11"  (1.499 m)    Weight:   Wt Readings from Last 1 Encounters:  02/03/23 104.1 kg    Ideal Body Weight:  44.3 kg  BMI:  Body mass index is 46.37 kg/m.  Estimated Nutritional  Needs:   Kcal:  1800-2100kcal/day  Protein:  90-110g/d  Fluid:  1.4-1.6L/day  Betsey Holiday MS, RD, LDN Please refer to [facility] for RD and/or RD on-call/weekend/after hours pager

## 2023-02-03 NOTE — Progress Notes (Signed)
PHARMACY - TOTAL PARENTERAL NUTRITION CONSULT NOTE   Indication: Prolonged ileus  Patient Measurements: Height: 4\' 11"  (149.9 cm) Weight: 104.1 kg (229 lb 9.6 oz) IBW/kg (Calculated) : 43.2 TPN AdjBW (KG): 57.8 Body mass index is 46.37 kg/m.  Assessment: Debra [name] is a 57 y.o. female s/p laparotomy, excision of greater omental mass, abdominal wall reconstruction with [name], appendectomy, and placement of Prevena vac. New PICC was replaced 4/12.   Glucose / Insulin:  --No apparent history of diabetes --BG 142-214 --mSSI 4x/day (0500, 1300, 1800, 2200) --SSI last 24h: 7 units  Electrolytes: Na 134  Triglycerides WNL Renal: SCr stable at baseline.  --Furosemide 20 mg IV daily added on 5/20. Hepatic: AST>ALT, bilirubin WNL GI Imaging: 9/11 CTAP: no new acute issues GI Surgeries / Procedures: s/p laparotomy, excision of greater omental mass, abdominal wall reconstruction with [name], appendectomy, and placement of Prevena vac 12/19/22 placement of right IJ port-a-catheter with dual reservoirs also s/p re-opening of laparotomy for repair of small bowel perforation   Central access: 02/15/22 TPN start date: 02/15/22  Nutritional Goals: Goal cyclic TPN over 16 hrs: cyclic TPN over 16 hrs: (provides 102 g of protein and 1811 kcals per day) total volume over 16hrs for calculations=1400 ml (1500 ml total with overfill)  RD Assessment:  Estimated Needs Total Energy Estimated Needs: 1800-2100kcal/day Total Protein Estimated Needs: 90-110g/d Total Fluid Estimated Needs: 1.4-1.6L/day  Current Nutrition:  Soft diet + nutritional supplements, not meeting PO needs  Plan: Transitioned to *Cyclic* TPN on 1/61/09.   to run over 16 hours per MD request. [name] at 2000 per discussion with dietician to allow patient time for walking in afternoon/evenings.  To run over 16 hours: -Start rate at 49 mL/hr for 1 hour. -Increase rate to 98 mL/hr for 13 hours.   -Decrease rate to 49 mL/hr for 1 hour. -Decrease rate to 25 mL/hr for 1 hour, then stop.  Plan:  See cyclic TPN rate above Nutritional Components Amino acids (using 15% Clinisol): 101 g Dextrose 19% = 266 g Lipids (using 20% SMOFlipids): 50.4 g kCal: 1817/24h  Electrolytes in TPN: Na 75 mEq/L, K 40 > 1mEq/L, Ca 78mEq/L, Mg 10 mEq/L, Phos 10 mmol/L  Continue ratio Cl:Ac 1:1  Adding back trace elements, MVI, chromium, zinc(increased to 7g on 5/16) and selenium(increased to 100mg  on 5/16) per dietary recs  Continue CBG/SSI for Cyclic TPN:  -CBG 2 hrs after cyclic TPN start -CBG during middle of cyclic TPN infusion -CBG 1 hr after cylic TPN stopped -CBG while off TPN Continue SSI insulin at moderate scale and remove insulin from TPN bag starting 01/06/23 due to low BG. Will consider re-adding if insulin usage becomes >10 units over 24h.- continue to f/u Check TPN labs on Mondays (CMP, phos, Mg, trig)  Burnis Medin, PharmD, BCPS Clinical Pharmacist 02/03/2023

## 2023-02-03 NOTE — Progress Notes (Signed)
[facility] SURGICAL PROGRESS NOTE   Hospital Day(s): 355.   Post op day(s): 46 Days Post-Op.   Interval History:  Patient seen and examined No issues overnight; Hemodynamically stable No new labs this morning; most recent Hgb stable at 8.3 No abdominal pain, nausea, emesis, fever [name] output unmeasured in last 24 hours; managing effluent independently No bleeding noted On cyclic TPN + soft diet  Ambulating well; independent  Review of Systems:  Constitutional: denies fever, chills  HEENT: denies cough or congestion  Respiratory: denies any shortness of breath  Cardiovascular: denies chest pain or palpitations  Gastrointestinal: denied abdominal pain, denied N/V Genitourinary: denies burning with urination or urinary frequency Integumentary: + midline wound (healing; stable)  Vital signs in last 24 hours: [min-max] current  Temp:  [97.8 F (36.6 C)-98.8 F (37.1 C)] 98.3 F (36.8 C) (05/28 0453) Pulse Rate:  [73-87] 77 (05/28 0453) Resp:  [16-19] 16 (05/28 0453) BP: (101-125)/(50-83) 101/53 (05/28 0453) SpO2:  [96 %-99 %] 96 % (05/28 0453) Weight:  [104.1 kg] 104.1 kg (05/28 0453)     Height: 4\' 11"  (149.9 cm) Weight: 104.1 kg BMI (Calculated): 46.02   Intake/Output last 2 shifts:  05/27 0701 - 05/28 0700 In: 839.2 [P.O.:120; I.V.:719.2] Out: -    Physical Exam:  Constitutional: alert, cooperative and no distress  Respiratory: breathing non-labored at rest  Cardiovascular: regular rate and sinus rhythm  Gastrointestinal: Soft, non-tender, non-distended, no rebound/guarding. Integumentary: Midline wound healing by secondary intention, peritoneum closed; granulating and new skin growing over majority of wound now; there are three stomatized areas visible in the LUQ and LLQ portions of the wound. No active bleeding Chest: Port to the right chest; no further swelling or drainage; no evidence of infection   Labs:     Latest Ref Rng & Units 02/02/2023     5:00 AM 01/27/2023    7:44 AM 01/19/2023    5:48 AM  CBC  WBC 4.0 - 10.5 K/uL 2.3  2.4  2.8   Hemoglobin 12.0 - 15.0 g/dL 8.3  7.5  7.4   Hematocrit 36.0 - 46.0 % 26.3  24.0  23.5   Platelets 150 - 400 K/uL 76  73  83       Latest Ref Rng & Units 02/02/2023    5:00 AM 02/01/2023    5:00 AM 01/30/2023   10:07 PM  CMP  Glucose 70 - 99 mg/dL 161  096    BUN 6 - 20 mg/dL 24  23    Creatinine 0.45 - 1.00 mg/dL 4.09  8.11    Sodium 914 - 145 mmol/L 134  137    Potassium 3.5 - 5.1 mmol/L 3.8  3.8  3.2   Chloride 98 - 111 mmol/L 104  108    CO2 22 - 32 mmol/L 27  26    Calcium 8.9 - 10.3 mg/dL 7.8  8.1    Total Protein 6.5 - 8.1 g/dL 5.6     Total Bilirubin 0.3 - 1.2 mg/dL 0.7     Alkaline Phos 38 - 126 U/L 138     AST 15 - 41 U/L 55     ALT 0 - 44 U/L 36       Imaging studies: No new imaging studies this AM   Assessment/Plan:  57 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  - New 05/28: No acute issues   - Wound Care ([name] Pouch); Now on smaller [name] pouch; continues managing effluent independently. Intermittent oozing from time to time. Change as needed.    - Continue soft diet as tolerated  - Continue cyclic TPN; weekly nutritional labs - Appreciate dietary assistance - monitoring weight; now on daily Lasix as of 05/20             - Monitor abdominal examination; on-going bowel function            - Pain control prn; antemetic prn            - Progressed with therapies; no longer any recommendations; ambulating well    - Discharge Planning: Continue to anticipate lengthy admission +/- possible transfer; no new progress regarding disposition  unfortunately   All of the above findings and recommendations were discussed with the patient, and the medical team, and all of patient's questions were answered to her expressed satisfaction.  -- [name], PA-C [facility] 02/03/2023, 7:45 AM M-F: 7am - 4pm

## 2023-02-04 LAB — GLUCOSE, CAPILLARY
Glucose-Capillary: 215 mg/dL — ABNORMAL HIGH (ref 70–99)
Glucose-Capillary: 141 mg/dL — ABNORMAL HIGH (ref 70–99)
Glucose-Capillary: 121 mg/dL — ABNORMAL HIGH (ref 70–99)
Glucose-Capillary: 107 mg/dL — ABNORMAL HIGH (ref 70–99)

## 2023-02-04 MED ORDER — ZINC CHLORIDE 1 MG/ML IV SOLN
INTRAVENOUS | Status: AC
  Filled 2023-02-04: qty 672

## 2023-02-04 NOTE — Progress Notes (Signed)
PHARMACY - TOTAL PARENTERAL NUTRITION CONSULT NOTE   Indication: Prolonged ileus  Patient Measurements: Height: 4\' 11"  (149.9 cm) Weight: 104.2 kg (229 lb 11.5 oz) IBW/kg (Calculated) : 43.2 TPN AdjBW (KG): 57.8 Body mass index is 46.4 kg/m.  Assessment: Debra [name] is a 57 y.o. female s/p laparotomy, excision of greater omental mass, abdominal wall reconstruction with Debra [name] release, appendectomy, and placement of Prevena vac. New PICC was replaced 4/12.   Glucose / Insulin:  --No apparent history of diabetes --BG 120 - 215 --mSSI 4x/day (0500, 1300, 1800, 2200) --SSI last 24h: 13 units  Electrolytes: Na bordeline  Triglycerides WNL Renal: SCr stable at baseline.  --Furosemide 20 mg IV daily added on 5/20. Hepatic: AST>ALT, bilirubin WNL GI Imaging: 9/11 CTAP: no new acute issues GI Surgeries / Procedures: s/p laparotomy, excision of greater omental mass, abdominal wall reconstruction with Debra [name] release, appendectomy, and placement of Prevena vac 12/19/22 placement of right IJ port-a-catheter with dual reservoirs also s/p re-opening of laparotomy for repair of small bowel perforation   Central access: 02/15/22 TPN start date: 02/15/22  Nutritional Goals: Goal cyclic TPN over 16 hrs: cyclic TPN over 16 hrs: (provides 102 g of protein and 1811 kcals per day) total volume over 16hrs for calculations=1400 ml (1500 ml total with overfill)  RD Assessment:  Estimated Needs Total Energy Estimated Needs: 1800-2100kcal/day Total Protein Estimated Needs: 90-110g/d Total Fluid Estimated Needs: 1.4-1.6L/day  Current Nutrition:  Soft diet + nutritional supplements, not meeting PO needs  Plan: Transitioned to *Cyclic* TPN on 1/61/09.   to run over 16 hours per MD request. Debra [name] at 2000 per discussion with dietician to allow patient time for walking in afternoon/evenings.  To run over 16 hours: -Start rate at 49 mL/hr for 1 hour. -Increase rate to 98 mL/hr for  13 hours.  -Decrease rate to 49 mL/hr for 1 hour. -Decrease rate to 25 mL/hr for 1 hour, then stop.  Plan:  See cyclic TPN rate above Nutritional Components Amino acids (using 15% Clinisol): 101 g Dextrose 19% = 266 g Lipids (using 20% SMOFlipids): 50.4 g kCal: 1817/24h  Electrolytes in TPN: Na 75 mEq/L, K 40 > 44mEq/L, Ca 22mEq/L, Mg 10 mEq/L, Phos 10 mmol/L  Continue ratio Cl:Ac 1:1  Adding back trace elements, MVI, chromium, zinc(increased to 7g on 5/16) and selenium(increased to 100mg  on 5/16) per dietary recs  Continue CBG/SSI for Cyclic TPN:  -CBG 2 hrs after cyclic TPN start -CBG during middle of cyclic TPN infusion -CBG 1 hr after cylic TPN stopped -CBG while off TPN Continue SSI insulin at moderate scale and remove insulin from TPN bag starting 01/06/23 due to low BG. Will consider re-adding if insulin usage becomes >10 units over 24h.- continue to f/u Check TPN labs on Mondays (CMP, phos, Mg, trig)  Debra [name], PharmD, BCPS Clinical Pharmacist 02/04/2023

## 2023-02-04 NOTE — Progress Notes (Signed)
[facility] SURGICAL PROGRESS NOTE   Hospital Day(s): 356.   Post op day(s): 47 Days Post-Op.   Interval History:  Patient seen and examined No issues overnight Resting comfortably  No new labs this morning No abdominal pain, nausea, emesis, fever [name] output unmeasured in last 24 hours; managing effluent independently No bleeding noted On cyclic TPN + soft diet  Ambulating well; independent  Review of Systems:  Constitutional: denies fever, chills  HEENT: denies cough or congestion  Respiratory: denies any shortness of breath  Cardiovascular: denies chest pain or palpitations  Gastrointestinal: denied abdominal pain, denied N/V Genitourinary: denies burning with urination or urinary frequency Integumentary: + midline wound (healing; stable)  Vital signs in last 24 hours: [min-max] current  Temp:  [98 F (36.7 C)-98.6 F (37 C)] 98.2 F (36.8 C) (05/29 0454) Pulse Rate:  [68-81] 81 (05/29 0454) Resp:  [16-18] 18 (05/29 0454) BP: (112-129)/(61-74) 112/61 (05/29 0454) SpO2:  [82 %-100 %] 99 % (05/29 0454) Weight:  [104.2 kg] 104.2 kg (05/29 0500)     Height: 4\' 11"  (149.9 cm) Weight: 104.2 kg BMI (Calculated): 46.02   Intake/Output last 2 shifts:  05/28 0701 - 05/29 0700 In: 480 [P.O.:480] Out: -    Physical Exam:  Constitutional: alert, cooperative and no distress  Respiratory: breathing non-labored at rest  Cardiovascular: regular rate and sinus rhythm  Gastrointestinal: Soft, non-tender, non-distended, no rebound/guarding. Integumentary: Midline wound healing by secondary intention, peritoneum closed; granulating and new skin growing over majority of wound now; there are three stomatized areas visible in the LUQ and LLQ portions of the wound. No active bleeding Chest: Port to the right chest; no further swelling or drainage; no evidence of infection   Labs:     Latest Ref Rng & Units 02/02/2023    5:00 AM 01/27/2023    7:44 AM 01/19/2023     5:48 AM  CBC  WBC 4.0 - 10.5 K/uL 2.3  2.4  2.8   Hemoglobin 12.0 - 15.0 g/dL 8.3  7.5  7.4   Hematocrit 36.0 - 46.0 % 26.3  24.0  23.5   Platelets 150 - 400 K/uL 76  73  83       Latest Ref Rng & Units 02/02/2023    5:00 AM 02/01/2023    5:00 AM 01/30/2023   10:07 PM  CMP  Glucose 70 - 99 mg/dL 161  096    BUN 6 - 20 mg/dL 24  23    Creatinine 0.45 - 1.00 mg/dL 4.09  8.11    Sodium 914 - 145 mmol/L 134  137    Potassium 3.5 - 5.1 mmol/L 3.8  3.8  3.2   Chloride 98 - 111 mmol/L 104  108    CO2 22 - 32 mmol/L 27  26    Calcium 8.9 - 10.3 mg/dL 7.8  8.1    Total Protein 6.5 - 8.1 g/dL 5.6     Total Bilirubin 0.3 - 1.2 mg/dL 0.7     Alkaline Phos 38 - 126 U/L 138     AST 15 - 41 U/L 55     ALT 0 - 44 U/L 36       Imaging studies: No new imaging studies this AM   Assessment/Plan:  57 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  - New 05/29: No acute issues   - Wound Care ([name] Pouch); Now on smaller [name] pouch; continues managing effluent independently. Intermittent oozing from time to time. Change as needed.    - Continue soft diet as tolerated  - Continue cyclic TPN; weekly nutritional labs - Appreciate dietary assistance - monitoring weight; now on daily Lasix as of 05/20             - Monitor abdominal examination; on-going bowel function            - Pain control prn; antemetic prn            - Progressed with therapies; no longer any recommendations; ambulating well    - Discharge Planning: Continue to anticipate lengthy admission +/- possible transfer; no new progress regarding disposition unfortunately   All of the above findings and  recommendations were discussed with the patient, and the medical team, and all of patient's questions were answered to her expressed satisfaction.  -- [name], PA-C [facility] 02/04/2023, 7:16 AM M-F: 7am - 4pm

## 2023-02-05 LAB — GLUCOSE, CAPILLARY
Glucose-Capillary: 143 mg/dL — ABNORMAL HIGH (ref 70–99)
Glucose-Capillary: 219 mg/dL — ABNORMAL HIGH (ref 70–99)
Glucose-Capillary: 140 mg/dL — ABNORMAL HIGH (ref 70–99)
Glucose-Capillary: 123 mg/dL — ABNORMAL HIGH (ref 70–99)
Glucose-Capillary: 150 mg/dL — ABNORMAL HIGH (ref 70–99)

## 2023-02-05 MED ORDER — ZINC CHLORIDE 1 MG/ML IV SOLN
INTRAVENOUS | Status: AC
  Filled 2023-02-05: qty 672

## 2023-02-05 NOTE — Progress Notes (Signed)
[facility] SURGICAL PROGRESS NOTE   Hospital Day(s): 357.   Post op day(s): 48 Days Post-Op.   Interval History:  Patient seen and examined No issues overnight Resting comfortably  No new labs this morning No abdominal pain, nausea, emesis, fever [name] output unmeasured in last 24 hours; managing effluent independently No bleeding noted On cyclic TPN + soft diet  Ambulating well; independent  Review of Systems:  Constitutional: denies fever, chills  HEENT: denies cough or congestion  Respiratory: denies any shortness of breath  Cardiovascular: denies chest pain or palpitations  Gastrointestinal: denied abdominal pain, denied N/V Genitourinary: denies burning with urination or urinary frequency Integumentary: + midline wound (healing; stable)  Vital signs in last 24 hours: [min-max] current  Temp:  [97.6 F (36.4 C)-98.3 F (36.8 C)] 98.3 F (36.8 C) (05/30 0814) Pulse Rate:  [70-76] 73 (05/30 0814) Resp:  [16-20] 16 (05/30 0814) BP: (102-115)/(55-61) 115/55 (05/30 0814) SpO2:  [96 %-98 %] 98 % (05/30 0814)     Height: 4\' 11"  (149.9 cm) Weight: 104.2 kg BMI (Calculated): 46.02   Intake/Output last 2 shifts:  05/29 0701 - 05/30 0700 In: 697.7 [P.O.:120; I.V.:577.7] Out: -    Physical Exam:  Constitutional: alert, cooperative and no distress  Respiratory: breathing non-labored at rest  Cardiovascular: regular rate and sinus rhythm  Gastrointestinal: Soft, non-tender, non-distended, no rebound/guarding. Integumentary: Midline wound healing by secondary intention, peritoneum closed; granulating and new skin growing over majority of wound now; there are three stomatized areas visible in the LUQ and LLQ portions of the wound. No active bleeding Chest: Port to the right chest; no further swelling or drainage; no evidence of infection   Labs:     Latest Ref Rng & Units 02/02/2023    5:00 AM 01/27/2023    7:44 AM 01/19/2023    5:48 AM  CBC  WBC 4.0 -  10.5 K/uL 2.3  2.4  2.8   Hemoglobin 12.0 - 15.0 g/dL 8.3  7.5  7.4   Hematocrit 36.0 - 46.0 % 26.3  24.0  23.5   Platelets 150 - 400 K/uL 76  73  83       Latest Ref Rng & Units 02/02/2023    5:00 AM 02/01/2023    5:00 AM 01/30/2023   10:07 PM  CMP  Glucose 70 - 99 mg/dL 161  096    BUN 6 - 20 mg/dL 24  23    Creatinine 0.45 - 1.00 mg/dL 4.09  8.11    Sodium 914 - 145 mmol/L 134  137    Potassium 3.5 - 5.1 mmol/L 3.8  3.8  3.2   Chloride 98 - 111 mmol/L 104  108    CO2 22 - 32 mmol/L 27  26    Calcium 8.9 - 10.3 mg/dL 7.8  8.1    Total Protein 6.5 - 8.1 g/dL 5.6     Total Bilirubin 0.3 - 1.2 mg/dL 0.7     Alkaline Phos 38 - 126 U/L 138     AST 15 - 41 U/L 55     ALT 0 - 44 U/L 36       Imaging studies: No new imaging studies this AM   Assessment/Plan:  57 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  - New 05/30: No acute issues in last 24 hours; no bleeding  - Wound Care ([name] Pouch); Now on smaller [name] pouch; continues managing effluent independently. Intermittent oozing from time to time. Change as needed.    - Continue soft diet as tolerated  - Continue cyclic TPN; weekly nutritional labs - Appreciate dietary assistance - monitoring weight; now on daily Lasix as of 05/20             - Monitor abdominal examination; on-going bowel function            - Pain control prn; antemetic prn            - Progressed with therapies; no longer any recommendations; ambulating well    - Discharge Planning: Continue to anticipate lengthy admission +/- possible transfer; no new progress regarding disposition unfortunately   All of the above findings and  recommendations were discussed with the patient, and the medical team, and all of patient's questions were answered to her expressed satisfaction.  -- [name], PA-C [facility] 02/05/2023, 9:20 AM M-F: 7am - 4pm

## 2023-02-05 NOTE — Progress Notes (Signed)
PHARMACY - TOTAL PARENTERAL NUTRITION CONSULT NOTE   Indication: Prolonged ileus  Patient Measurements: Height: 4\' 11"  (149.9 cm) Weight: 104.2 kg (229 lb 11.5 oz) IBW/kg (Calculated) : 43.2 TPN AdjBW (KG): 57.8 Body mass index is 46.4 kg/m.  Assessment: Debra [name] is a 57 y.o. female s/p laparotomy, excision of greater omental mass, abdominal wall reconstruction with [name], appendectomy, and placement of Prevena vac. New PICC was replaced 4/12.   Glucose / Insulin:  --No apparent history of diabetes --BG 120 - 215 --mSSI 4x/day (0500, 1300, 1800, 2200) --SSI last 24h: 13 units  Electrolytes: Na bordeline  Triglycerides WNL Renal: SCr stable at baseline.  --Furosemide 20 mg IV daily added on 5/20. Hepatic: AST>ALT, bilirubin WNL GI Imaging: 9/11 CTAP: no new acute issues GI Surgeries / Procedures: s/p laparotomy, excision of greater omental mass, abdominal wall reconstruction with [name], appendectomy, and placement of Prevena vac 12/19/22 placement of right IJ port-a-catheter with dual reservoirs also s/p re-opening of laparotomy for repair of small bowel perforation   Central access: 02/15/22 TPN start date: 02/15/22  Nutritional Goals: Goal cyclic TPN over 16 hrs: cyclic TPN over 16 hrs: (provides 102 g of protein and 1811 kcals per day) total volume over 16hrs for calculations=1400 ml (1500 ml total with overfill)  RD Assessment:  Estimated Needs Total Energy Estimated Needs: 1800-2100kcal/day Total Protein Estimated Needs: 90-110g/d Total Fluid Estimated Needs: 1.4-1.6L/day  Current Nutrition:  Soft diet + nutritional supplements, not meeting PO needs  Plan: Transitioned to *Cyclic* TPN on 1/61/09.   to run over 16 hours per MD request. [name] at 2000 per discussion with dietician to allow patient time for walking in afternoon/evenings.  To run over 16 hours: -Start rate at 49 mL/hr for 1 hour. -Increase rate to 98 mL/hr for  13 hours.  -Decrease rate to 49 mL/hr for 1 hour. -Decrease rate to 25 mL/hr for 1 hour, then stop.  Plan:  See cyclic TPN rate above Nutritional Components Amino acids (using 15% Clinisol): 101 g Dextrose 19% = 266 g Lipids (using 20% SMOFlipids): 50.4 g kCal: 1817/24h  Electrolytes in TPN: Na 75 mEq/L, K 40 > 47mEq/L, Ca 48mEq/L, Mg 10 mEq/L, Phos 10 mmol/L  Continue ratio Cl:Ac 1:1  Adding back trace elements, MVI, chromium, zinc(increased to 7g on 5/16) and selenium(increased to 100mg  on 5/16) per dietary recs  Continue CBG/SSI for Cyclic TPN:  -CBG 2 hrs after cyclic TPN start -CBG during middle of cyclic TPN infusion -CBG 1 hr after cylic TPN stopped -CBG while off TPN Continue SSI insulin at moderate scale and remove insulin from TPN bag starting 01/06/23 due to low BG. Will consider re-adding if insulin usage becomes >10 units over 24h.- continue to f/u Check TPN labs on Mondays (CMP, phos, Mg, trig)   Elliot Gurney, PharmD, BCPS Clinical Pharmacist  02/05/2023 7:41 AM

## 2023-02-06 LAB — COMPREHENSIVE METABOLIC PANEL
ALT: 46 U/L — ABNORMAL HIGH (ref 0–44)
AST: 73 U/L — ABNORMAL HIGH (ref 15–41)
Albumin: 2.9 g/dL — ABNORMAL LOW (ref 3.5–5.0)
Alkaline Phosphatase: 161 U/L — ABNORMAL HIGH (ref 38–126)
Anion gap: 6 (ref 5–15)
BUN: 25 mg/dL — ABNORMAL HIGH (ref 6–20)
CO2: 25 mmol/L (ref 22–32)
Calcium: 8.5 mg/dL — ABNORMAL LOW (ref 8.9–10.3)
Chloride: 105 mmol/L (ref 98–111)
Creatinine, Ser: 0.5 mg/dL (ref 0.44–1.00)
GFR, Estimated: >60 mL/min (ref >60)
Glucose, Bld: 184 mg/dL — ABNORMAL HIGH (ref 70–99)
Potassium: 4.4 mmol/L (ref 3.5–5.1)
Sodium: 136 mmol/L (ref 135–145)
Total Bilirubin: 0.6 mg/dL (ref 0.3–1.2)
Total Protein: 7 g/dL (ref 6.5–8.1)

## 2023-02-06 LAB — GLUCOSE, CAPILLARY
Glucose-Capillary: 193 mg/dL — ABNORMAL HIGH (ref 70–99)
Glucose-Capillary: 141 mg/dL — ABNORMAL HIGH (ref 70–99)
Glucose-Capillary: 172 mg/dL — ABNORMAL HIGH (ref 70–99)

## 2023-02-06 MED ORDER — ZINC CHLORIDE 1 MG/ML IV SOLN
INTRAVENOUS | Status: AC
  Filled 2023-02-06: qty 672

## 2023-02-06 NOTE — Progress Notes (Signed)
PHARMACY - TOTAL PARENTERAL NUTRITION CONSULT NOTE   Indication: Prolonged ileus  Patient Measurements: Height: 4\' 11"  (149.9 cm) Weight: 104 kg (229 lb 4.5 oz) IBW/kg (Calculated) : 43.2 TPN AdjBW (KG): 57.8 Body mass index is 46.31 kg/m.  Assessment: Debra [name] is a 57 y.o. female s/p laparotomy, excision of greater omental mass, abdominal wall reconstruction with [name], appendectomy, and placement of Prevena vac. New PICC was replaced 4/12.   Glucose / Insulin:  --No apparent history of diabetes --BG 120 - 215 --mSSI 4x/day (0500, 1300, 1800, 2200) --SSI last 24h: 13 units  Electrolytes: Na bordeline  Triglycerides WNL Renal: SCr stable at baseline.  --Furosemide 20 mg IV daily added on 5/20. Hepatic: AST>ALT, bilirubin WNL GI Imaging: 9/11 CTAP: no new acute issues GI Surgeries / Procedures: s/p laparotomy, excision of greater omental mass, abdominal wall reconstruction with [name], appendectomy, and placement of Prevena vac 12/19/22 placement of right IJ port-a-catheter with dual reservoirs also s/p re-opening of laparotomy for repair of small bowel perforation   Central access: 02/15/22 TPN start date: 02/15/22  Nutritional Goals: Goal cyclic TPN over 16 hrs: cyclic TPN over 16 hrs: (provides 102 g of protein and 1811 kcals per day) total volume over 16hrs for calculations=1400 ml (1500 ml total with overfill)  RD Assessment:  Estimated Needs Total Energy Estimated Needs: 1800-2100kcal/day Total Protein Estimated Needs: 90-110g/d Total Fluid Estimated Needs: 1.4-1.6L/day  Current Nutrition:  Soft diet + nutritional supplements, not meeting PO needs  Plan: Transitioned to *Cyclic* TPN on 05/07/55.   to run over 16 hours per MD request. [name] at 2000 per discussion with dietician to allow patient time for walking in afternoon/evenings.  To run over 16 hours: -Start rate at 49 mL/hr for 1 hour. -Increase rate to 98 mL/hr for 13  hours.  -Decrease rate to 49 mL/hr for 1 hour. -Decrease rate to 25 mL/hr for 1 hour, then stop.  Plan:  See cyclic TPN rate above Nutritional Components Amino acids (using 15% Clinisol): 101 g Dextrose 19% = 266 g Lipids (using 20% SMOFlipids): 50.4 g kCal: 1817/24h  Electrolytes in TPN: Na 75 mEq/L, K 40 > 86mEq/L, Ca 51mEq/L, Mg 10 mEq/L, Phos 10 mmol/L  Continue ratio Cl:Ac 1:1  Adding back trace elements, MVI, chromium, zinc(increased to 7g on 5/16) and selenium(increased to 100mg  on 5/16) per dietary recs  Continue CBG/SSI for Cyclic TPN:  -CBG 2 hrs after cyclic TPN start -CBG during middle of cyclic TPN infusion -CBG 1 hr after cylic TPN stopped -CBG while off TPN Continue SSI insulin at moderate scale and remove insulin from TPN bag starting 01/06/23 due to low BG. Will consider re-adding if insulin usage becomes >10 units over 24h.- continue to f/u Check TPN labs on Mondays (CMP, phos, Mg, trig)   Elliot Gurney, PharmD, BCPS Clinical Pharmacist  02/06/2023 7:40 AM

## 2023-02-06 NOTE — Progress Notes (Signed)
[facility] SURGICAL PROGRESS NOTE   Hospital Day(s): 358.   Post op day(s): 49 Days Post-Op.   Interval History:  Patient seen and examined No issues overnight No bleeding  No new labs this morning; drawn weekly on Mondays and prn No abdominal pain, nausea, emesis, fever [name] output unmeasured in last 24 hours; managing effluent independently No bleeding noted On cyclic TPN + soft diet  Ambulating well; independent  Review of Systems:  Constitutional: denies fever, chills  HEENT: denies cough or congestion  Respiratory: denies any shortness of breath  Cardiovascular: denies chest pain or palpitations  Gastrointestinal: denied abdominal pain, denied N/V Genitourinary: denies burning with urination or urinary frequency Integumentary: + midline wound (healing; stable)  Vital signs in last 24 hours: [min-max] current  Temp:  [97.7 F (36.5 C)-98.5 F (36.9 C)] 98.4 F (36.9 C) (05/31 0746) Pulse Rate:  [75-79] 77 (05/31 0746) Resp:  [18-20] 18 (05/31 0746) BP: (100-120)/(54-71) 100/62 (05/31 0746) SpO2:  [96 %-100 %] 99 % (05/31 0746) Weight:  [104 kg] 104 kg (05/31 0533)     Height: 4\' 11"  (149.9 cm) Weight: 104 kg BMI (Calculated): 46.02   Intake/Output last 2 shifts:  05/30 0701 - 05/31 0700 In: 785.2 [P.O.:118; I.V.:667.2] Out: -    Physical Exam:  Constitutional: alert, cooperative and no distress  Respiratory: breathing non-labored at rest  Cardiovascular: regular rate and sinus rhythm  Gastrointestinal: Soft, non-tender, non-distended, no rebound/guarding. Integumentary: Midline wound healing by secondary intention, peritoneum closed; granulating and new skin growing over majority of wound now; there are three stomatized areas visible in the LUQ and LLQ portions of the wound. No active bleeding Chest: Port to the right chest; no further swelling or drainage; no evidence of infection   Labs:     Latest Ref Rng & Units 02/02/2023    5:00 AM  01/27/2023    7:44 AM 01/19/2023    5:48 AM  CBC  WBC 4.0 - 10.5 K/uL 2.3  2.4  2.8   Hemoglobin 12.0 - 15.0 g/dL 8.3  7.5  7.4   Hematocrit 36.0 - 46.0 % 26.3  24.0  23.5   Platelets 150 - 400 K/uL 76  73  83       Latest Ref Rng & Units 02/02/2023    5:00 AM 02/01/2023    5:00 AM 01/30/2023   10:07 PM  CMP  Glucose 70 - 99 mg/dL 161  096    BUN 6 - 20 mg/dL 24  23    Creatinine 0.45 - 1.00 mg/dL 4.09  8.11    Sodium 914 - 145 mmol/L 134  137    Potassium 3.5 - 5.1 mmol/L 3.8  3.8  3.2   Chloride 98 - 111 mmol/L 104  108    CO2 22 - 32 mmol/L 27  26    Calcium 8.9 - 10.3 mg/dL 7.8  8.1    Total Protein 6.5 - 8.1 g/dL 5.6     Total Bilirubin 0.3 - 1.2 mg/dL 0.7     Alkaline Phos 38 - 126 U/L 138     AST 15 - 41 U/L 55     ALT 0 - 44 U/L 36       Imaging studies: No new imaging studies this AM   Assessment/Plan:  57 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  - New 05/31: No acute issues in last 24 hours; no bleeding  - Wound Care ([name] Pouch); Now on smaller [name] pouch; continues managing effluent independently. Intermittent oozing from time to time. Change as needed.    - Continue soft diet as tolerated  - Continue cyclic TPN; weekly nutritional labs - Appreciate dietary assistance - monitoring weight; now on daily Lasix as of 05/20             - Monitor abdominal examination; on-going bowel function            - Pain control prn; antemetic prn            - Progressed with therapies; no longer any recommendations; ambulating well    - Discharge Planning: Continue to anticipate lengthy admission +/- possible transfer; no new progress regarding  disposition unfortunately   All of the above findings and recommendations were discussed with the patient, and the medical team, and all of patient's questions were answered to her expressed satisfaction.  -- [name], PA-C [facility] 02/06/2023, 8:28 AM M-F: 7am - 4pm

## 2023-02-07 MED ORDER — ZINC CHLORIDE 1 MG/ML IV SOLN
INTRAVENOUS | Status: AC
  Filled 2023-02-07: qty 672

## 2023-02-07 NOTE — Progress Notes (Signed)
[facility] SURGICAL PROGRESS NOTE   Hospital Day(s): 359.   Post op day(s): 50 Days Post-Op.   Interval History:  Patient seen and examined No issues overnight No bleeding  No new labs this morning; drawn weekly on Mondays and prn No abdominal pain, nausea, emesis, fever [name] output unmeasured in last 24 hours; managing effluent independently No bleeding noted On cyclic TPN + soft diet  Ambulating well; independent  Review of Systems:  Constitutional: denies fever, chills  HEENT: denies cough or congestion  Respiratory: denies any shortness of breath  Cardiovascular: denies chest pain or palpitations  Gastrointestinal: denied abdominal pain, denied N/V Genitourinary: denies burning with urination or urinary frequency Integumentary: + midline wound (healing; stable)  Vital signs in last 24 hours: [min-max] current  Temp:  [98.4 F (36.9 C)-98.8 F (37.1 C)] 98.4 F (36.9 C) (06/01 0901) Pulse Rate:  [70-86] 70 (06/01 0901) Resp:  [17-20] 17 (06/01 0901) BP: (106-129)/(54-92) 113/54 (06/01 0901) SpO2:  [98 %-99 %] 99 % (06/01 0901)     Height: 4\' 11"  (149.9 cm) Weight: 104 kg BMI (Calculated): 46.02   Intake/Output last 2 shifts:  05/31 0701 - 06/01 0700 In: 1514.9 [I.V.:1514.9] Out: -    Physical Exam:  Constitutional: alert, cooperative and no distress  Respiratory: breathing non-labored at rest  Cardiovascular: regular rate and sinus rhythm  Gastrointestinal: Soft, non-tender, non-distended, no rebound/guarding. Integumentary: Midline wound healing by secondary intention, peritoneum closed; granulating and new skin growing over majority of wound now; there are three stomatized areas visible in the LUQ and LLQ portions of the wound. No active bleeding Chest: Port to the right chest; no further swelling or drainage; no evidence of infection   Labs:     Latest Ref Rng & Units 02/02/2023    5:00 AM 01/27/2023    7:44 AM 01/19/2023    5:48 AM  CBC   WBC 4.0 - 10.5 K/uL 2.3  2.4  2.8   Hemoglobin 12.0 - 15.0 g/dL 8.3  7.5  7.4   Hematocrit 36.0 - 46.0 % 26.3  24.0  23.5   Platelets 150 - 400 K/uL 76  73  83       Latest Ref Rng & Units 02/06/2023    9:21 AM 02/02/2023    5:00 AM 02/01/2023    5:00 AM  CMP  Glucose 70 - 99 mg/dL 960  454  098   BUN 6 - 20 mg/dL 25  24  23    Creatinine 0.44 - 1.00 mg/dL 1.19  1.47  8.29   Sodium 135 - 145 mmol/L 136  134  137   Potassium 3.5 - 5.1 mmol/L 4.4  3.8  3.8   Chloride 98 - 111 mmol/L 105  104  108   CO2 22 - 32 mmol/L 25  27  26    Calcium 8.9 - 10.3 mg/dL 8.5  7.8  8.1   Total Protein 6.5 - 8.1 g/dL 7.0  5.6    Total Bilirubin 0.3 - 1.2 mg/dL 0.6  0.7    Alkaline Phos 38 - 126 U/L 161  138    AST 15 - 41 U/L 73  55    ALT 0 - 44 U/L 46  36      Imaging studies: No new imaging studies this AM   Assessment/Plan:  57 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  - New 05/31: No acute issues in last 24 hours; no bleeding  - Wound Care ([name] Pouch); Now on smaller [name] pouch; continues managing effluent independently. Intermittent oozing from time to time. Change as needed.    - Continue soft diet as tolerated  - Continue cyclic TPN; weekly nutritional labs - Appreciate dietary assistance - monitoring weight; now on daily Lasix as of 05/20             - Monitor abdominal examination; on-going bowel function            - Pain control prn; antemetic prn            - Progressed with therapies; no longer any recommendations; ambulating well    - Discharge Planning: Continue to anticipate lengthy admission +/- possible transfer; no new progress regarding  disposition unfortunately   All of the above findings and recommendations were discussed with the patient, and the medical team, and all of patient's questions were answered to her expressed satisfaction.

## 2023-02-07 NOTE — Progress Notes (Signed)
PHARMACY - TOTAL PARENTERAL NUTRITION CONSULT NOTE   Indication: Prolonged ileus  Patient Measurements: Height: 4\' 11"  (149.9 cm) Weight: 104 kg (229 lb 4.5 oz) IBW/kg (Calculated) : 43.2 TPN AdjBW (KG): 57.8 Body mass index is 46.31 kg/m.  Assessment: Debra [name] is a 58 y.o. female s/p laparotomy, excision of greater omental mass, abdominal wall reconstruction with [name], appendectomy, and placement of Prevena vac. New PICC was replaced 4/12.   Glucose / Insulin:  --No apparent history of diabetes --BG 5/31: 141-193 --mSSI 4x/day (0500, 1300, 1800, 2200) --SSI last 24h: 10 units  Electrolytes: Na bordeline  Triglycerides WNL Renal: SCr stable at baseline.  --Furosemide 20 mg IV daily added on 5/20. Hepatic: AST>ALT, increasing, bilirubin WNL GI Imaging: 9/11 CTAP: no new acute issues GI Surgeries / Procedures: s/p laparotomy, excision of greater omental mass, abdominal wall reconstruction with [name], appendectomy, and placement of Prevena vac 12/19/22 placement of right IJ port-a-catheter with dual reservoirs also s/p re-opening of laparotomy for repair of small bowel perforation   Central access: 02/15/22 TPN start date: 02/15/22  Nutritional Goals: Goal cyclic TPN over 16 hrs: cyclic TPN over 16 hrs: (provides 102 g of protein and 1811 kcals per day) total volume over 16hrs for calculations=1400 ml (1500 ml total with overfill)  RD Assessment:  Estimated Needs Total Energy Estimated Needs: 1800-2100kcal/day Total Protein Estimated Needs: 90-110g/d Total Fluid Estimated Needs: 1.4-1.6L/day  Current Nutrition:  Soft diet + nutritional supplements, not meeting PO needs  Plan: Transitioned to *Cyclic* TPN on 2/95/62.   to run over 16 hours per MD request. [name] at 2000 per discussion with dietician to allow patient time for walking in afternoon/evenings.  To run over 16 hours: -Start rate at 49 mL/hr for 1 hour. -Increase rate to  98 mL/hr for 13 hours.  -Decrease rate to 49 mL/hr for 1 hour. -Decrease rate to 25 mL/hr for 1 hour, then stop.  Plan:  See cyclic TPN rate above Nutritional Components Amino acids (using 15% Clinisol): 101 g Dextrose 19% = 266 g Lipids (using 20% SMOFlipids): 50.4 g kCal: 1817/24h  Electrolytes in TPN: Na 75 mEq/L, K 40 > 41mEq/L, Ca 63mEq/L, Mg 10 mEq/L, Phos 10 mmol/L  Continue ratio Cl:Ac 1:1  On daily lasix as of 5/20 Adding back trace elements, MVI, chromium, zinc(increased to 7g on 5/16) and selenium(increased to 100mg  on 5/16) per dietary recs  Continue CBG/SSI for Cyclic TPN:  -CBG 2 hrs after cyclic TPN start -CBG during middle of cyclic TPN infusion -CBG 1 hr after cylic TPN stopped -CBG while off TPN Continue SSI insulin at moderate scale and remove insulin from TPN bag starting 01/06/23 due to low BG. Will consider re-adding if insulin usage becomes >10 units over 24h.- continue to f/u Check TPN labs now weekly on Mondays (CMP, phos, Mg, trig)  Bari Mantis PharmD Clinical Pharmacist 02/07/2023

## 2023-02-08 LAB — GLUCOSE, CAPILLARY
Glucose-Capillary: 106 mg/dL — ABNORMAL HIGH (ref 70–99)
Glucose-Capillary: 315 mg/dL — ABNORMAL HIGH (ref 70–99)

## 2023-02-08 MED ORDER — ZINC CHLORIDE 1 MG/ML IV SOLN
INTRAVENOUS | Status: AC
  Filled 2023-02-08: qty 672

## 2023-02-08 NOTE — Progress Notes (Signed)
[facility] SURGICAL PROGRESS NOTE   Hospital Day(s): 360.   Post op day(s): 51 Days Post-Op.   Interval History:  Patient seen and examined No new issues overnight No new labs this morning; drawn weekly on Mondays and prn No abdominal pain, nausea, emesis, fever [name] output unmeasured in last 24 hours; managing effluent independently No bleeding noted now, slight amount reported last evening.  On cyclic TPN + soft diet  Ambulating well; independent  Review of Systems:  Constitutional: denies fever, chills  HEENT: denies cough or congestion  Respiratory: denies any shortness of breath  Cardiovascular: denies chest pain or palpitations  Gastrointestinal: denied abdominal pain, denied N/V Genitourinary: denies burning with urination or urinary frequency Integumentary: + midline wound (healing; stable)  Vital signs in last 24 hours: [min-max] current  Temp:  [98.2 F (36.8 C)-98.4 F (36.9 C)] 98.4 F (36.9 C) (06/02 0456) Pulse Rate:  [70-72] 70 (06/02 0456) Resp:  [16] 16 (06/02 0456) BP: (102-108)/(59-60) 108/60 (06/02 0456) SpO2:  [97 %-98 %] 98 % (06/02 0456) Weight:  [103.1 kg] 103.1 kg (06/02 1055)     Height: 4\' 11"  (149.9 cm) Weight: 103.1 kg BMI (Calculated): 46.02   Intake/Output last 2 shifts:  06/01 0701 - 06/02 0700 In: 1444.5 [I.V.:1444.5] Out: -    Physical Exam:  Constitutional: alert, cooperative and no distress  Respiratory: breathing non-labored at rest  Cardiovascular: regular rate and sinus rhythm  Gastrointestinal: Soft, non-tender, non-distended, no rebound/guarding. Integumentary: Midline wound healing by secondary intention, peritoneum closed; granulating and new skin growing over majority of wound now; there are three stomatized areas visible in the LUQ and LLQ portions of the wound. No active bleeding Chest: Port to the right chest; no further swelling or drainage; no evidence of infection   Labs:     Latest Ref Rng &  Units 02/02/2023    5:00 AM 01/27/2023    7:44 AM 01/19/2023    5:48 AM  CBC  WBC 4.0 - 10.5 K/uL 2.3  2.4  2.8   Hemoglobin 12.0 - 15.0 g/dL 8.3  7.5  7.4   Hematocrit 36.0 - 46.0 % 26.3  24.0  23.5   Platelets 150 - 400 K/uL 76  73  83       Latest Ref Rng & Units 02/06/2023    9:21 AM 02/02/2023    5:00 AM 02/01/2023    5:00 AM  CMP  Glucose 70 - 99 mg/dL 161  096  045   BUN 6 - 20 mg/dL 25  24  23    Creatinine 0.44 - 1.00 mg/dL 4.09  8.11  9.14   Sodium 135 - 145 mmol/L 136  134  137   Potassium 3.5 - 5.1 mmol/L 4.4  3.8  3.8   Chloride 98 - 111 mmol/L 105  104  108   CO2 22 - 32 mmol/L 25  27  26    Calcium 8.9 - 10.3 mg/dL 8.5  7.8  8.1   Total Protein 6.5 - 8.1 g/dL 7.0  5.6    Total Bilirubin 0.3 - 1.2 mg/dL 0.6  0.7    Alkaline Phos 38 - 126 U/L 161  138    AST 15 - 41 U/L 73  55    ALT 0 - 44 U/L 46  36      Imaging studies: No new imaging studies this AM   Assessment/Plan:  57 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  - New 06/02: No acute issues in last 24 hours; no remarkable bleeding  - Wound Care ([name] Pouch); Now on smaller [name] pouch; continues managing effluent independently. Intermittent oozing from time to time. Change as needed.    - Continue soft diet as tolerated  - Continue cyclic TPN; weekly nutritional labs - Appreciate dietary assistance - monitoring weight; now on daily Lasix as of 05/20             - Monitor abdominal examination; on-going bowel function            - Pain control prn; antemetic prn            - Progressed with therapies; no longer any recommendations; ambulating well    - Discharge Planning: Continue  to anticipate lengthy admission +/- possible transfer; no new progress regarding disposition unfortunately   All of the above findings and recommendations were discussed with the patient, and the medical team, and all of patient's questions were answered to her expressed satisfaction.

## 2023-02-08 NOTE — Progress Notes (Signed)
PHARMACY - TOTAL PARENTERAL NUTRITION CONSULT NOTE   Indication: Prolonged ileus  Patient Measurements: Height: 4\' 11"  (149.9 cm) Weight: 104 kg (229 lb 4.5 oz) IBW/kg (Calculated) : 43.2 TPN AdjBW (KG): 57.8 Body mass index is 46.31 kg/m.  Assessment: Debra [name] is a 57 y.o. female s/p laparotomy, excision of greater omental mass, abdominal wall reconstruction with [name], appendectomy, and placement of Prevena vac. New PICC was replaced 4/12.   Glucose / Insulin:  --No apparent history of diabetes --BG 5/31: 141-193 --mSSI 4x/day (0500, 1300, 1800, 2200) --SSI last 24h: 10 units  Electrolytes: Na bordeline  Triglycerides WNL Renal: SCr stable at baseline.  --Furosemide 20 mg IV daily added on 5/20. Hepatic: AST>ALT, increasing, bilirubin WNL GI Imaging: 9/11 CTAP: no new acute issues GI Surgeries / Procedures: s/p laparotomy, excision of greater omental mass, abdominal wall reconstruction with [name], appendectomy, and placement of Prevena vac 12/19/22 placement of right IJ port-a-catheter with dual reservoirs also s/p re-opening of laparotomy for repair of small bowel perforation   Central access: 02/15/22 TPN start date: 02/15/22  Nutritional Goals: Goal cyclic TPN over 16 hrs: cyclic TPN over 16 hrs: (provides 102 g of protein and 1811 kcals per day) total volume over 16hrs for calculations=1400 ml (1500 ml total with overfill)  RD Assessment:  Estimated Needs Total Energy Estimated Needs: 1800-2100kcal/day Total Protein Estimated Needs: 90-110g/d Total Fluid Estimated Needs: 1.4-1.6L/day  Current Nutrition:  Soft diet + nutritional supplements, not meeting PO needs  Plan: Transitioned to *Cyclic* TPN on 9/81/19.   to run over 16 hours per MD request. [name] at 2000 per discussion with dietician to allow patient time for walking in afternoon/evenings.  To run over 16 hours: -Start rate at 49 mL/hr for 1 hour. -Increase rate to  98 mL/hr for 13 hours.  -Decrease rate to 49 mL/hr for 1 hour. -Decrease rate to 25 mL/hr for 1 hour, then stop.  Plan:  See cyclic TPN rate above Nutritional Components Amino acids (using 15% Clinisol): 101 g Dextrose 19% = 266 g Lipids (using 20% SMOFlipids): 50.4 g kCal: 1817/24h  Electrolytes in TPN: Na 75 mEq/L, K 40 > 23mEq/L, Ca 61mEq/L, Mg 10 mEq/L, Phos 10 mmol/L  Continue ratio Cl:Ac 1:1  On daily lasix as of 5/20 Adding back trace elements, MVI, chromium, zinc(increased to 7g on 5/16) and selenium(increased to 100mg  on 5/16) per dietary recs  Continue CBG/SSI for Cyclic TPN:  -CBG 2 hrs after cyclic TPN start -CBG during middle of cyclic TPN infusion -CBG 1 hr after cylic TPN stopped -CBG while off TPN Continue SSI insulin at moderate scale and remove insulin from TPN bag starting 01/06/23 due to low BG. Will consider re-adding if insulin usage becomes >10 units over 24h.- continue to f/u Check TPN labs now weekly on Mondays (CMP, phos, Mg, trig)  Bari Mantis PharmD Clinical Pharmacist 02/08/2023

## 2023-02-09 LAB — GLUCOSE, CAPILLARY
Glucose-Capillary: 118 mg/dL — ABNORMAL HIGH (ref 70–99)
Glucose-Capillary: 210 mg/dL — ABNORMAL HIGH (ref 70–99)
Glucose-Capillary: 160 mg/dL — ABNORMAL HIGH (ref 70–99)
Glucose-Capillary: 121 mg/dL — ABNORMAL HIGH (ref 70–99)
Glucose-Capillary: 184 mg/dL — ABNORMAL HIGH (ref 70–99)
Glucose-Capillary: 214 mg/dL — ABNORMAL HIGH (ref 70–99)
Glucose-Capillary: 145 mg/dL — ABNORMAL HIGH (ref 70–99)
Glucose-Capillary: 239 mg/dL — ABNORMAL HIGH (ref 70–99)
Glucose-Capillary: 222 mg/dL — ABNORMAL HIGH (ref 70–99)
Glucose-Capillary: 218 mg/dL — ABNORMAL HIGH (ref 70–99)
Glucose-Capillary: 141 mg/dL — ABNORMAL HIGH (ref 70–99)

## 2023-02-09 LAB — COMPREHENSIVE METABOLIC PANEL
ALT: 41 U/L (ref 0–44)
AST: 58 U/L — ABNORMAL HIGH (ref 15–41)
Albumin: 2.3 g/dL — ABNORMAL LOW (ref 3.5–5.0)
Alkaline Phosphatase: 148 U/L — ABNORMAL HIGH (ref 38–126)
Anion gap: 4 — ABNORMAL LOW (ref 5–15)
BUN: 21 mg/dL — ABNORMAL HIGH (ref 6–20)
CO2: 25 mmol/L (ref 22–32)
Calcium: 7.9 mg/dL — ABNORMAL LOW (ref 8.9–10.3)
Chloride: 102 mmol/L (ref 98–111)
Creatinine, Ser: 0.46 mg/dL (ref 0.44–1.00)
GFR, Estimated: >60 mL/min (ref >60)
Glucose, Bld: 239 mg/dL — ABNORMAL HIGH (ref 70–99)
Potassium: 4.1 mmol/L (ref 3.5–5.1)
Sodium: 131 mmol/L — ABNORMAL LOW (ref 135–145)
Total Bilirubin: 0.7 mg/dL (ref 0.3–1.2)
Total Protein: 5.6 g/dL — ABNORMAL LOW (ref 6.5–8.1)

## 2023-02-09 LAB — TRIGLYCERIDES: Triglycerides: 151 mg/dL — ABNORMAL HIGH (ref <150)

## 2023-02-09 LAB — PHOSPHORUS: Phosphorus: 2.8 mg/dL (ref 2.5–4.6)

## 2023-02-09 LAB — MAGNESIUM: Magnesium: 2 mg/dL (ref 1.7–2.4)

## 2023-02-09 MED ORDER — ZINC CHLORIDE 1 MG/ML IV SOLN
INTRAVENOUS | Status: AC
  Filled 2023-02-09: qty 672

## 2023-02-09 NOTE — Progress Notes (Signed)
PHARMACY - TOTAL PARENTERAL NUTRITION CONSULT NOTE   Indication: Prolonged ileus  Patient Measurements: Height: 4\' 11"  (149.9 cm) Weight: 103.1 kg (227 lb 3.2 oz) IBW/kg (Calculated) : 43.2 TPN AdjBW (KG): 57.8 Body mass index is 45.89 kg/m.  Assessment: Debra [name] is a 57 y.o. female s/p laparotomy, excision of greater omental mass, abdominal wall reconstruction with [name], appendectomy, and placement of Prevena vac. New PICC was replaced 4/12.   Glucose / Insulin:  --No apparent history of diabetes --mSSI 4x/day (0500, 1300, 1800, 2200) --BG last 24h: 106 - 315 --SSI last 24h: 23 units  Electrolytes: Hyponatremia Renal: SCr stable at baseline --Furosemide 20 mg IV daily added on 5/20 Hepatic: Mild transaminitis GI Imaging: 9/11 CTAP: no new acute issues GI Surgeries / Procedures: s/p laparotomy, excision of greater omental mass, abdominal wall reconstruction with [name], appendectomy, and placement of Prevena vac 12/19/22 placement of right IJ port-a-catheter with dual reservoirs also s/p re-opening of laparotomy for repair of small bowel perforation   Central access: 02/15/22 TPN start date: 02/15/22  Nutritional Goals: Goal cyclic TPN over 16 hrs: cyclic TPN over 16 hrs: (provides 102 g of protein and 1811 kcals per day) total volume over 16hrs for calculations=1400 ml (1500 ml total with overfill)  RD Assessment:  Estimated Needs Total Energy Estimated Needs: 1800-2100kcal/day Total Protein Estimated Needs: 90-110g/d Total Fluid Estimated Needs: 1.4-1.6L/day  Current Nutrition:  Soft diet + nutritional supplements, not meeting PO needs  Plan: Transitioned to *Cyclic* TPN on 1/61/09.   to run over 16 hours per MD request. [name] at 2000 per discussion with dietician to allow patient time for walking in afternoon/evenings.  To run over 16 hours: -Start rate at 49 mL/hr for 1 hour. -Increase rate to 98 mL/hr for 13 hours.   -Decrease rate to 49 mL/hr for 1 hour. -Decrease rate to 25 mL/hr for 1 hour, then stop.  Plan:  See cyclic TPN rate above Nutritional Components Amino acids (using 15% Clinisol): 101 g Dextrose 19% = 266 g Lipids (using 20% SMOFlipids): 50.4 g kCal: 1817/24h  Electrolytes in TPN: Na 75 mEq/L, K 50 mEq/L, Ca 9mEq/L, Mg 10 mEq/L, Phos 10 mmol/L, Cl:Ac 1:1  Lasix 20 mg IV daily since 5/20 Continue trace elements, MVI, chromium, zinc (increased to 7g on 5/16) and selenium (increased to 100mg  on 5/16) per dietary recs  Continue CBG/SSI for Cyclic TPN:  -CBG 2 hrs after cyclic TPN start -CBG during middle of cyclic TPN -CBG 1 hr after cylic TPN stopped -CBG while off TPN Continue SSI insulin at moderate scale and add 5u insulin back to TPN Check TPN labs weekly on Mondays and more frequently if indicated  [name] 02/09/2023

## 2023-02-09 NOTE — Progress Notes (Signed)
[facility] SURGICAL PROGRESS NOTE   Hospital Day(s): 361.   Post op day(s): 52 Days Post-Op.   Interval History:  Patient seen and examined No issues over the weekend Doing well this morning; resting comfortably  Labs are reassuring, mild hypertriglyceridemia to 151 No abdominal pain, nausea, emesis, fever [name] output unmeasured in last 24 hours; managing effluent independently No bleeding noted On cyclic TPN + soft diet  Ambulating well; independent  Review of Systems:  Constitutional: denies fever, chills  HEENT: denies cough or congestion  Respiratory: denies any shortness of breath  Cardiovascular: denies chest pain or palpitations  Gastrointestinal: denied abdominal pain, denied N/V Genitourinary: denies burning with urination or urinary frequency Integumentary: + midline wound (healing; stable)  Vital signs in last 24 hours: [min-max] current  Temp:  [98.1 F (36.7 C)-98.8 F (37.1 C)] 98.4 F (36.9 C) (06/03 0737) Pulse Rate:  [75-80] 79 (06/03 0737) Resp:  [17-18] 18 (06/03 0737) BP: (90-116)/(35-82) 110/47 (06/03 0737) SpO2:  [92 %-100 %] 96 % (06/03 0737) Weight:  [103.1 kg] 103.1 kg (06/02 1055)     Height: 4\' 11"  (149.9 cm) Weight: 103.1 kg BMI (Calculated): 46.02   Intake/Output last 2 shifts:  06/02 0701 - 06/03 0700 In: 1304.2 [I.V.:1304.2] Out: -    Physical Exam:  Constitutional: alert, cooperative and no distress  Respiratory: breathing non-labored at rest  Cardiovascular: regular rate and sinus rhythm  Gastrointestinal: Soft, non-tender, non-distended, no rebound/guarding. Integumentary: Midline wound healing by secondary intention, peritoneum closed; granulating and new skin growing over majority of wound now; there are three stomatized areas visible in the LUQ and LLQ portions of the wound. No active bleeding Chest: Port to the right chest; no further swelling or drainage; no evidence of infection   Labs:     Latest Ref Rng  & Units 02/02/2023    5:00 AM 01/27/2023    7:44 AM 01/19/2023    5:48 AM  CBC  WBC 4.0 - 10.5 K/uL 2.3  2.4  2.8   Hemoglobin 12.0 - 15.0 g/dL 8.3  7.5  7.4   Hematocrit 36.0 - 46.0 % 26.3  24.0  23.5   Platelets 150 - 400 K/uL 76  73  83       Latest Ref Rng & Units 02/09/2023    5:26 AM 02/06/2023    9:21 AM 02/02/2023    5:00 AM  CMP  Glucose 70 - 99 mg/dL 161  096  045   BUN 6 - 20 mg/dL 21  25  24    Creatinine 0.44 - 1.00 mg/dL 4.09  8.11  9.14   Sodium 135 - 145 mmol/L 131  136  134   Potassium 3.5 - 5.1 mmol/L 4.1  4.4  3.8   Chloride 98 - 111 mmol/L 102  105  104   CO2 22 - 32 mmol/L 25  25  27    Calcium 8.9 - 10.3 mg/dL 7.9  8.5  7.8   Total Protein 6.5 - 8.1 g/dL 5.6  7.0  5.6   Total Bilirubin 0.3 - 1.2 mg/dL 0.7  0.6  0.7   Alkaline Phos 38 - 126 U/L 148  161  138   AST 15 - 41 U/L 58  73  55   ALT 0 - 44 U/L 41  46  36     Imaging studies: No new imaging studies this AM   Assessment/Plan:  57 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  - New 06/03: No acute issues currently; stable throughout weekend   - Wound Care ([name] Pouch); Now on smaller [name] pouch; continues managing effluent independently. Intermittent oozing from time to time. Change as needed.    - Continue soft diet as tolerated  - Continue cyclic TPN; weekly nutritional labs - Appreciate dietary assistance - monitoring weight; now on daily Lasix as of 05/20             - Monitor abdominal examination; on-going bowel function            - Pain control prn; antemetic prn            - Progressed with therapies; no longer any recommendations; ambulating well    - Discharge  Planning: Continue to anticipate lengthy admission +/- possible transfer; no new progress regarding disposition unfortunately   All of the above findings and recommendations were discussed with the patient, and the medical team, and all of patient's questions were answered to her expressed satisfaction.  -- [name], PA-C [facility] 02/09/2023, 8:53 AM M-F: 7am - 4pm

## 2023-02-10 LAB — GLUCOSE, CAPILLARY
Glucose-Capillary: 119 mg/dL — ABNORMAL HIGH (ref 70–99)
Glucose-Capillary: 199 mg/dL — ABNORMAL HIGH (ref 70–99)
Glucose-Capillary: 164 mg/dL — ABNORMAL HIGH (ref 70–99)
Glucose-Capillary: 113 mg/dL — ABNORMAL HIGH (ref 70–99)
Glucose-Capillary: 105 mg/dL — ABNORMAL HIGH (ref 70–99)

## 2023-02-10 MED ORDER — ZINC CHLORIDE 1 MG/ML IV SOLN
INTRAVENOUS | Status: AC
  Filled 2023-02-10: qty 672

## 2023-02-10 NOTE — Progress Notes (Signed)
PHARMACY - TOTAL PARENTERAL NUTRITION CONSULT NOTE   Indication: Prolonged ileus  Patient Measurements: Height: 4\' 11"  (149.9 cm) Weight: 105.2 kg (231 lb 14.8 oz) IBW/kg (Calculated) : 43.2 TPN AdjBW (KG): 57.8 Body mass index is 46.84 kg/m.  Assessment: Debra [name] is a 57 y.o. female s/p laparotomy, excision of greater omental mass, abdominal wall reconstruction with [name], appendectomy, and placement of Prevena vac.  Glucose / Insulin:  --No apparent history of diabetes --mSSI 4x/day (0500, 1300, 1800, 2200) --BG last 24h: 119 - 222 --SSI last 24h: 10 units  Electrolytes: Hyponatremia Renal: SCr stable at baseline --Furosemide 20 mg IV daily added on 5/20 Hepatic: Mild transaminitis GI Imaging: 9/11 CTAP: no new acute issues GI Surgeries / Procedures: s/p laparotomy, excision of greater omental mass, abdominal wall reconstruction with [name], appendectomy, and placement of Prevena vac 12/19/22 placement of right IJ port-a-catheter with dual reservoirs also s/p re-opening of laparotomy for repair of small bowel perforation   Central access: 02/15/22 TPN start date: 02/15/22  Nutritional Goals: Goal cyclic TPN over 16 hrs: cyclic TPN over 16 hrs: (provides 102 g of protein and 1811 kcals per day) total volume over 16hrs for calculations=1400 ml (1500 ml total with overfill)  RD Assessment:  Estimated Needs Total Energy Estimated Needs: 1800-2100kcal/day Total Protein Estimated Needs: 90-110g/d Total Fluid Estimated Needs: 1.4-1.6L/day  Current Nutrition:  Soft diet + nutritional supplements, not meeting PO needs  Plan: Transitioned to *Cyclic* TPN on 1/61/09.   to run over 16 hours per MD request. [name] at 2000 per discussion with dietician to allow patient time for walking in afternoon/evenings.  To run over 16 hours: -Start rate at 49 mL/hr for 1 hour. -Increase rate to 98 mL/hr for 13 hours.  -Decrease rate to 49 mL/hr for 1  hour. -Decrease rate to 25 mL/hr for 1 hour, then stop.  Plan:  See cyclic TPN rate above Nutritional Components Amino acids (using 15% Clinisol): 101 g Dextrose 19% = 266 g Lipids (using 20% SMOFlipids): 50.4 g kCal: 1817/24h  Electrolytes in TPN: Na 75 mEq/L, K 50 mEq/L, Ca 21mEq/L, Mg 10 mEq/L, Phos 10 mmol/L, Cl:Ac 1:1  Lasix 20 mg IV daily since 5/20 Continue trace elements, MVI, chromium, zinc (increased to 7g on 5/16) and selenium (increased to 100mg  on 5/16) per dietary recs  Continue CBG/SSI for Cyclic TPN:  -CBG 2 hrs after cyclic TPN start -CBG during middle of cyclic TPN -CBG 1 hr after cylic TPN stopped -CBG while off TPN Continue SSI insulin at moderate scale and 5u insulin in TPN Check TPN labs weekly on Mondays and more frequently if indicated  [name] 02/10/2023

## 2023-02-10 NOTE — Progress Notes (Signed)
Nutrition Follow-up  DOCUMENTATION CODES:   Obesity unspecified  INTERVENTION:   Continue cyclic TPN per pharmacy (16 hrs)- provides 1811kcal/day and 102g/day protein   Ensure Enlive po BID, each supplement provides 350 kcal and 20 grams of protein.  MVI, chromium and trace elements in TPN   Triglycerides checked monthly   Daily weights   Continue zinc additional 7mg  daily added to TPN (increased 5/16)  Continue Selenium additional daily added to TPN (increased 5/16)  Vitamin A 10,000 units po daily   Will check routine vitamin labs every 3 months, next due 7/11  NUTRITION DIAGNOSIS:   Increased nutrient needs related to wound healing, catabolic illness as evidenced by estimated needs. -ongoing   GOAL:   Patient will meet greater than or equal to 90% of their needs -met with TPN   MONITOR:   PO intake, Supplement acceptance, Labs, Weight trends, Diet advancement, I & O's, TPN  ASSESSMENT:   57 y/o female with h/o hypothyroidism, COVID 19 (3/21), kidney stones and stage 3 colon cancer (s/p left hemicolectomy 5/21 and chemotherapy) who is admitted for new pelvic mass now s/p laparotomy 6/8 (with excision of pelvic mass from greater omentum, abdominal wall reconstruction with bilateral myocutaneous flaps and mesh, incisional hernia repair, appendectomy repair and VAC placement) complicated by bowel perforation s/p reopening of recent laparotomy 6/10 (with repair of small bowel perforation, excision of mesh, placement of two phasix mesh and VAC placement). Pathology returned as metastatic adenocarcinoma. Pt with L1 compression fracture.   Pt s/p right IJ port 4/12  Pt continues to tolerate TPN well at goal rate; TPN is currently being cycled for 16 hrs via port. Triglycerides slightly elevated likely r/t increased blood glucoses. Insulin added back in TPN; will monitor blood glucoses. Pt remains up ~32lbs from her admit weight; PA aware. Pt continues on lasix. Will  check routine vitamin labs every 3 months as pt on chronic TPN (next due 7/11). Vitamins being repleted as needed. Pt continues to have poor oral intake; pt eating <25% of meals. [name] pouch in place. No discharge plan in place.   Medications reviewed and include: lasix, insulin, synthroid, protonix, vitamin A, TPN  -Selenium- 86(L), zinc- 45 wnl- 5/13 -Chromium- 2.0 wnl, copper- 109 wnl, Manganese- 17.9 wnl, vitamin D 74.99 wnl, Iodine 70 wnl, aluminum 10(H)- 4/12 -Iron 25(L), TIBC 398, folate 29.0 wnl, B12 922(H)- 4/12 -Ferritin 14- 5/13 -Vitamin B1- 73.2 wnl, B6- 8.9 wnl, vitamin C- 0.9 wnl, vitamin E- 12.6 wnl, vitamin K- 0.49 wnl- 4/12 Vitamin A- 14.1(L)- 5/13 -Carnitine 32.0 wnl- 4/12  Labs reviewed: Na 131(L), K 4.1 wnl, BUN 21(H), P 2.8 wnl, Mg 2.0 wnl- 6/3 Triglycerides- 151(H)- 6/3 Wbc- 2.3(L), Hgb 8.3(L), Hct 26.3(L)- 5/27 Cbgs- 207, 198 x 48 hrs  Diet Order:    Diet Order             DIET SOFT Fluid consistency: Thin  Diet effective now                  EDUCATION NEEDS:   Not appropriate for education at this time  Skin:  Skin Assessment: Reviewed RN Assessment (Midline wound with EC fistula)  Last BM:  5/31  Height:   Ht Readings from Last 1 Encounters:  12/19/22 4\' 11"  (1.499 m)    Weight:   Wt Readings from Last 1 Encounters:  02/10/23 105.2 kg    Ideal Body Weight:  44.3 kg  BMI:  Body mass index is 46.84 kg/m.  Estimated  Nutritional Needs:   Kcal:  1800-2100kcal/day  Protein:  90-110g/d  Fluid:  1.4-1.6L/day  Betsey Holiday MS, RD, LDN Please refer to [facility] for RD and/or RD on-call/weekend/after hours pager

## 2023-02-10 NOTE — Plan of Care (Signed)
  Problem: Activity: Goal: Risk for activity intolerance will decrease Outcome: Progressing   Problem: Coping: Goal: Level of anxiety will decrease Outcome: Progressing   Problem: Pain Managment: Goal: General experience of comfort will improve Outcome: Progressing   

## 2023-02-10 NOTE — Progress Notes (Signed)
[facility] SURGICAL PROGRESS NOTE   Hospital Day(s): 362.   Post op day(s): 53 Days Post-Op.   Interval History:  Patient seen and examined No issues over the weekend Doing well this morning; no acute changes No new labs this morning No abdominal pain, nausea, emesis, fever [name] output unmeasured in last 24 hours; managing effluent independently No bleeding noted On cyclic TPN + soft diet  Ambulating well; independent  Review of Systems:  Constitutional: denies fever, chills  HEENT: denies cough or congestion  Respiratory: denies any shortness of breath  Cardiovascular: denies chest pain or palpitations  Gastrointestinal: denied abdominal pain, denied N/V Genitourinary: denies burning with urination or urinary frequency Integumentary: + midline wound (healing; stable)  Vital signs in last 24 hours: [min-max] current  Temp:  [98 F (36.7 C)-98.2 F (36.8 C)] 98.2 F (36.8 C) (06/04 0735) Pulse Rate:  [73-81] 75 (06/04 0735) Resp:  [16-18] 16 (06/04 0735) BP: (100-117)/(53-66) 111/64 (06/04 0735) SpO2:  [95 %-100 %] 99 % (06/04 0735) Weight:  [105.2 kg] 105.2 kg (06/04 0412)     Height: 4\' 11"  (149.9 cm) Weight: 105.2 kg BMI (Calculated): 46.02   Intake/Output last 2 shifts:  06/03 0701 - 06/04 0700 In: 865.6 [P.O.:300; I.V.:565.6] Out: -    Physical Exam:  Constitutional: alert, cooperative and no distress  Respiratory: breathing non-labored at rest  Cardiovascular: regular rate and sinus rhythm  Gastrointestinal: Soft, non-tender, non-distended, no rebound/guarding. Integumentary: Midline wound healing by secondary intention, peritoneum closed; granulating and new skin growing over majority of wound now; there are three stomatized areas visible in the LUQ and LLQ portions of the wound. No active bleeding Chest: Port to the right chest; no further swelling or drainage; no evidence of infection   Labs:     Latest Ref Rng & Units 02/02/2023     5:00 AM 01/27/2023    7:44 AM 01/19/2023    5:48 AM  CBC  WBC 4.0 - 10.5 K/uL 2.3  2.4  2.8   Hemoglobin 12.0 - 15.0 g/dL 8.3  7.5  7.4   Hematocrit 36.0 - 46.0 % 26.3  24.0  23.5   Platelets 150 - 400 K/uL 76  73  83       Latest Ref Rng & Units 02/09/2023    5:26 AM 02/06/2023    9:21 AM 02/02/2023    5:00 AM  CMP  Glucose 70 - 99 mg/dL 161  096  045   BUN 6 - 20 mg/dL 21  25  24    Creatinine 0.44 - 1.00 mg/dL 4.09  8.11  9.14   Sodium 135 - 145 mmol/L 131  136  134   Potassium 3.5 - 5.1 mmol/L 4.1  4.4  3.8   Chloride 98 - 111 mmol/L 102  105  104   CO2 22 - 32 mmol/L 25  25  27    Calcium 8.9 - 10.3 mg/dL 7.9  8.5  7.8   Total Protein 6.5 - 8.1 g/dL 5.6  7.0  5.6   Total Bilirubin 0.3 - 1.2 mg/dL 0.7  0.6  0.7   Alkaline Phos 38 - 126 U/L 148  161  138   AST 15 - 41 U/L 58  73  55   ALT 0 - 44 U/L 41  46  36     Imaging studies: No new imaging studies this AM   Assessment/Plan:  57 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  - New 06/04: No acute issues  - Wound Care ([name] Pouch); Now on smaller [name] pouch; continues managing effluent independently. Intermittent oozing from time to time. Change as needed.    - Continue soft diet as tolerated  - Continue cyclic TPN; weekly nutritional labs - Appreciate dietary assistance - monitoring weight; now on daily Lasix as of 05/20             - Monitor abdominal examination; on-going bowel function            - Pain control prn; antemetic prn            - Progressed with therapies; no longer any recommendations; ambulating well    - Discharge Planning: Continue to anticipate lengthy admission +/-  possible transfer; no new progress regarding disposition unfortunately   All of the above findings and recommendations were discussed with the patient, and the medical team, and all of patient's questions were answered to her expressed satisfaction.  -- [name], PA-C [facility] 02/10/2023, 9:47 AM M-F: 7am - 4pm

## 2023-02-11 LAB — GLUCOSE, CAPILLARY
Glucose-Capillary: 155 mg/dL — ABNORMAL HIGH (ref 70–99)
Glucose-Capillary: 147 mg/dL — ABNORMAL HIGH (ref 70–99)
Glucose-Capillary: 196 mg/dL — ABNORMAL HIGH (ref 70–99)

## 2023-02-11 MED ORDER — ZINC CHLORIDE 1 MG/ML IV SOLN
INTRAVENOUS | Status: AC
  Filled 2023-02-11: qty 672

## 2023-02-11 NOTE — Progress Notes (Signed)
[facility] SURGICAL PROGRESS NOTE   Hospital Day(s): 363.   Post op day(s): 54 Days Post-Op.   Interval History:  Patient seen and examined No new or acute issues this AM No abdominal pain, nausea, emesis, fever [name] output 150 ccs + unmeasured in last 24 hours; managing effluent independently No bleeding noted On cyclic TPN + soft diet  Ambulating well; independent  Review of Systems:  Constitutional: denies fever, chills  HEENT: denies cough or congestion  Respiratory: denies any shortness of breath  Cardiovascular: denies chest pain or palpitations  Gastrointestinal: denied abdominal pain, denied N/V Genitourinary: denies burning with urination or urinary frequency Integumentary: + midline wound (healing; stable)  Vital signs in last 24 hours: [min-max] current  Temp:  [97.9 F (36.6 C)-98.5 F (36.9 C)] 97.9 F (36.6 C) (06/05 0407) Pulse Rate:  [71-75] 72 (06/05 0407) Resp:  [16-18] 18 (06/05 0407) BP: (100-112)/(55-68) 112/59 (06/05 0407) SpO2:  [99 %-100 %] 100 % (06/05 0407) Weight:  [105.1 kg-105.2 kg] 105.1 kg (06/05 0500)     Height: 4\' 11"  (149.9 cm) Weight: 105.1 kg BMI (Calculated): 46.02   Intake/Output last 2 shifts:  06/04 0701 - 06/05 0700 In: 1999.2 [P.O.:240; I.V.:1759.2] Out: 150 [Stool:150]   Physical Exam:  Constitutional: alert, cooperative and no distress  Respiratory: breathing non-labored at rest  Cardiovascular: regular rate and sinus rhythm  Gastrointestinal: Soft, non-tender, non-distended, no rebound/guarding. Integumentary: Midline wound healing by secondary intention, peritoneum closed; granulating and new skin growing over majority of wound now; there are three stomatized areas visible in the LUQ and LLQ portions of the wound. No active bleeding Chest: Port to the right chest; no further swelling or drainage; no evidence of infection   Labs:     Latest Ref Rng & Units 02/02/2023    5:00 AM 01/27/2023    7:44 AM  01/19/2023    5:48 AM  CBC  WBC 4.0 - 10.5 K/uL 2.3  2.4  2.8   Hemoglobin 12.0 - 15.0 g/dL 8.3  7.5  7.4   Hematocrit 36.0 - 46.0 % 26.3  24.0  23.5   Platelets 150 - 400 K/uL 76  73  83       Latest Ref Rng & Units 02/09/2023    5:26 AM 02/06/2023    9:21 AM 02/02/2023    5:00 AM  CMP  Glucose 70 - 99 mg/dL 409  811  914   BUN 6 - 20 mg/dL 21  25  24    Creatinine 0.44 - 1.00 mg/dL 7.82  9.56  2.13   Sodium 135 - 145 mmol/L 131  136  134   Potassium 3.5 - 5.1 mmol/L 4.1  4.4  3.8   Chloride 98 - 111 mmol/L 102  105  104   CO2 22 - 32 mmol/L 25  25  27    Calcium 8.9 - 10.3 mg/dL 7.9  8.5  7.8   Total Protein 6.5 - 8.1 g/dL 5.6  7.0  5.6   Total Bilirubin 0.3 - 1.2 mg/dL 0.7  0.6  0.7   Alkaline Phos 38 - 126 U/L 148  161  138   AST 15 - 41 U/L 58  73  55   ALT 0 - 44 U/L 41  46  36     Imaging studies: No new imaging studies this AM   Assessment/Plan:  57 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  - New 06/05: Nothing new today  - Wound Care ([name] Pouch); Now on smaller [name] pouch; continues managing effluent independently. Intermittent oozing from time to time. Change as needed.    - Continue soft diet as tolerated  - Continue cyclic TPN; weekly nutritional labs - Appreciate dietary assistance - monitoring weight; now on daily Lasix as of 05/20             - Monitor abdominal examination; on-going bowel function            - Pain control prn; antemetic prn            - Progressed with therapies; no longer any recommendations; ambulating well    - Discharge Planning: Continue to anticipate lengthy admission +/- possible transfer; no new progress  regarding disposition unfortunately   All of the above findings and recommendations were discussed with the patient, and the medical team, and all of patient's questions were answered to her expressed satisfaction.  -- [name], PA-C  [facility] 02/11/2023, 7:33 AM M-F: 7am - 4pm

## 2023-02-11 NOTE — Progress Notes (Signed)
PHARMACY - TOTAL PARENTERAL NUTRITION CONSULT NOTE   Indication: Prolonged ileus  Patient Measurements: Height: 4\' 11"  (149.9 cm) Weight: 105.1 kg (231 lb 11.3 oz) IBW/kg (Calculated) : 43.2 TPN AdjBW (KG): 57.8 Body mass index is 46.8 kg/m.  Assessment: Debra [name] is a 57 y.o. female s/p laparotomy, excision of greater omental mass, abdominal wall reconstruction with [name], appendectomy, and placement of Prevena vac.  Glucose / Insulin:  --No apparent history of diabetes --mSSI 4x/day (0500, 1300, 1800, 2200) --BG last 24h: 105 - 199 --SSI last 24h: 9 units  Electrolytes: Hyponatremia Renal: SCr stable at baseline --Furosemide 20 mg IV daily added on 5/20 Hepatic: Mild transaminitis GI Imaging: 9/11 CTAP: no new acute issues GI Surgeries / Procedures: s/p laparotomy, excision of greater omental mass, abdominal wall reconstruction with [name], appendectomy, and placement of Prevena vac 12/19/22 placement of right IJ port-a-catheter with dual reservoirs also s/p re-opening of laparotomy for repair of small bowel perforation   Central access: 02/15/22 TPN start date: 02/15/22  Nutritional Goals: Goal cyclic TPN over 16 hrs: cyclic TPN over 16 hrs: (provides 102 g of protein and 1811 kcals per day) total volume over 16hrs for calculations=1400 ml (1500 ml total with overfill)  RD Assessment:  Estimated Needs Total Energy Estimated Needs: 1800-2100kcal/day Total Protein Estimated Needs: 90-110g/d Total Fluid Estimated Needs: 1.4-1.6L/day  Current Nutrition:  Soft diet + nutritional supplements, not meeting PO needs  Plan: Transitioned to *Cyclic* TPN on 1/61/09.   to run over 16 hours per MD request. [name] at 2000 per discussion with dietician to allow patient time for walking in afternoon/evenings.  To run over 16 hours: -Start rate at 49 mL/hr for 1 hour. -Increase rate to 98 mL/hr for 13 hours.  -Decrease rate to 49 mL/hr for 1  hour. -Decrease rate to 25 mL/hr for 1 hour, then stop.  Plan:  See cyclic TPN rate above Nutritional Components Amino acids (using 15% Clinisol): 101 g Dextrose 19% = 266 g Lipids (using 20% SMOFlipids): 50.4 g kCal: 1817/24h  Electrolytes in TPN: Na 75 mEq/L, K 50 mEq/L, Ca 55mEq/L, Mg 10 mEq/L, Phos 10 mmol/L, Cl:Ac 1:1  Lasix 20 mg IV daily since 5/20 Continue trace elements, MVI, chromium, zinc (increased to 7g on 5/16) and selenium (increased to 100mg  on 5/16) per dietary recs  Continue CBG/SSI for Cyclic TPN:  -CBG 2 hrs after cyclic TPN start -CBG during middle of cyclic TPN -CBG 1 hr after cylic TPN stopped -CBG while off TPN Continue SSI insulin at moderate scale and 5u insulin in TPN Check TPN labs weekly on Mondays and more frequently if indicated  [name] 02/11/2023

## 2023-02-12 LAB — GLUCOSE, CAPILLARY
Glucose-Capillary: 149 mg/dL — ABNORMAL HIGH (ref 70–99)
Glucose-Capillary: 186 mg/dL — ABNORMAL HIGH (ref 70–99)
Glucose-Capillary: 165 mg/dL — ABNORMAL HIGH (ref 70–99)
Glucose-Capillary: 76 mg/dL (ref 70–99)

## 2023-02-12 MED ORDER — ZINC CHLORIDE 1 MG/ML IV SOLN
INTRAVENOUS | Status: AC
  Filled 2023-02-12: qty 672

## 2023-02-12 NOTE — Progress Notes (Signed)
PHARMACY - TOTAL PARENTERAL NUTRITION CONSULT NOTE   Indication: Prolonged ileus  Patient Measurements: Height: 4\' 11"  (149.9 cm) Weight: 105.2 kg (231 lb 14.8 oz) IBW/kg (Calculated) : 43.2 TPN AdjBW (KG): 57.8 Body mass index is 46.84 kg/m.  Assessment: Rosaline Temya Breitenstein is a 57 y.o. female s/p laparotomy, excision of greater omental mass, abdominal wall reconstruction with [name], appendectomy, and placement of Prevena vac.  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 --Furosemide 20 mg IV daily added on 5/20 Hepatic: Mild transaminitis GI Imaging: 9/11 CTAP: no new acute issues GI Surgeries / Procedures: s/p laparotomy, excision of greater omental mass, abdominal wall reconstruction with [name], appendectomy, and placement of Prevena vac 12/19/22 placement of right IJ port-a-catheter with dual reservoirs also s/p re-opening of laparotomy for repair of small bowel perforation   Central access: 02/15/22 TPN start date: 02/15/22  Nutritional Goals: Goal cyclic TPN over 16 hrs: cyclic TPN over 16 hrs: (provides 102 g of protein and 1811 kcals per day) total volume over 16hrs for calculations=1400 ml (1500 ml total with overfill)  RD Assessment:  Estimated Needs Total Energy Estimated Needs: 1800-2100kcal/day Total Protein Estimated Needs: 90-110g/d Total Fluid Estimated Needs: 1.4-1.6L/day  Current Nutrition:  Soft diet + nutritional supplements, not meeting PO needs  Plan: Transitioned to *Cyclic* TPN on 1/61/09.   to run over 16 hours per MD request. [name] at 2000 per discussion with dietician to allow patient time for walking in afternoon/evenings.  To run over 16 hours: -Start rate at 49 mL/hr for 1 hour. -Increase rate to 98 mL/hr for 13 hours.  -Decrease rate to 49 mL/hr for 1  hour. -Decrease rate to 25 mL/hr for 1 hour, then stop.  Plan:  See cyclic TPN rate above Nutritional Components Amino acids (using 15% Clinisol): 101 g Dextrose 19% = 266 g Lipids (using 20% SMOFlipids): 50.4 g kCal: 1817/24h  Electrolytes in TPN: Na 75 mEq/L, K 50 mEq/L, Ca 71mEq/L, Mg 10 mEq/L, Phos 10 mmol/L, Cl:Ac 1:1  Lasix 20 mg IV daily since 5/20 Continue trace elements, MVI, chromium, zinc (increased to 7g on 5/16) and selenium (increased to 100mg  on 5/16) per dietary recs  Continue CBG/SSI for Cyclic TPN:  -CBG 2 hrs after cyclic TPN start -CBG during middle of cyclic TPN -CBG 1 hr after cylic TPN stopped -CBG while off TPN Continue SSI insulin at moderate scale and 5u insulin in TPN Check TPN labs weekly on Mondays and more frequently if indicated  [name] 02/12/2023

## 2023-02-12 NOTE — Progress Notes (Signed)
[facility] SURGICAL PROGRESS NOTE   Hospital Day(s): 364.   Post op day(s): 55 Days Post-Op.   Interval History:  Patient seen and examined No new or acute issues this AM No abdominal pain, nausea, emesis, fever No new labs this AM [name] output unmeasured in last 24 hours; managing effluent independently No bleeding noted On cyclic TPN + soft diet  Ambulating well; independent  Review of Systems:  Constitutional: denies fever, chills  HEENT: denies cough or congestion  Respiratory: denies any shortness of breath  Cardiovascular: denies chest pain or palpitations  Gastrointestinal: denied abdominal pain, denied N/V Genitourinary: denies burning with urination or urinary frequency Integumentary: + midline wound (healing; stable)  Vital signs in last 24 hours: [min-max] current  Temp:  [98 F (36.7 C)-98.5 F (36.9 C)] 98.5 F (36.9 C) (06/06 0526) Pulse Rate:  [70-78] 78 (06/06 0526) Resp:  [18-20] 20 (06/06 0526) BP: (98-102)/(52-74) 98/61 (06/06 0526) SpO2:  [95 %-99 %] 98 % (06/06 0526) Weight:  [105.2 kg] 105.2 kg (06/06 0500)     Height: 4\' 11"  (149.9 cm) Weight: 105.2 kg BMI (Calculated): 46.02   Intake/Output last 2 shifts:  06/05 0701 - 06/06 0700 In: 1310.5 [I.V.:1310.5] Out: -    Physical Exam:  Constitutional: alert, cooperative and no distress  Respiratory: breathing non-labored at rest  Cardiovascular: regular rate and sinus rhythm  Gastrointestinal: Soft, non-tender, non-distended, no rebound/guarding. Integumentary: Midline wound healing by secondary intention, peritoneum closed; granulating and new skin growing over majority of wound now; there are three stomatized areas visible in the LUQ and LLQ portions of the wound. No active bleeding Chest: Port to the right chest; no further swelling or drainage; no evidence of infection   Labs:     Latest Ref Rng & Units 02/02/2023    5:00 AM 01/27/2023    7:44 AM 01/19/2023    5:48 AM  CBC   WBC 4.0 - 10.5 K/uL 2.3  2.4  2.8   Hemoglobin 12.0 - 15.0 g/dL 8.3  7.5  7.4   Hematocrit 36.0 - 46.0 % 26.3  24.0  23.5   Platelets 150 - 400 K/uL 76  73  83       Latest Ref Rng & Units 02/09/2023    5:26 AM 02/06/2023    9:21 AM 02/02/2023    5:00 AM  CMP  Glucose 70 - 99 mg/dL 045  409  811   BUN 6 - 20 mg/dL 21  25  24    Creatinine 0.44 - 1.00 mg/dL 9.14  7.82  9.56   Sodium 135 - 145 mmol/L 131  136  134   Potassium 3.5 - 5.1 mmol/L 4.1  4.4  3.8   Chloride 98 - 111 mmol/L 102  105  104   CO2 22 - 32 mmol/L 25  25  27    Calcium 8.9 - 10.3 mg/dL 7.9  8.5  7.8   Total Protein 6.5 - 8.1 g/dL 5.6  7.0  5.6   Total Bilirubin 0.3 - 1.2 mg/dL 0.7  0.6  0.7   Alkaline Phos 38 - 126 U/L 148  161  138   AST 15 - 41 U/L 58  73  55   ALT 0 - 44 U/L 41  46  36     Imaging studies: No new imaging studies this AM   Assessment/Plan:  57 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  - New 06/06: No acute changes  - Wound Care ([name] Pouch); Now on smaller [name] pouch; continues managing effluent independently. Intermittent oozing from time to time. Change as needed.    - Continue soft diet as tolerated  - Continue cyclic TPN; weekly nutritional labs - Appreciate dietary assistance - monitoring weight; now on daily Lasix as of 05/20             - Monitor abdominal examination; on-going bowel function            - Pain control prn; antemetic prn            - Progressed with therapies; no longer any recommendations; ambulating well    - Discharge Planning: Continue to anticipate lengthy admission +/- possible transfer; no new progress regarding disposition  unfortunately   All of the above findings and recommendations were discussed with the patient, and the medical team, and all of patient's questions were answered to her expressed satisfaction.  -- [name], PA-C [facility] 02/12/2023, 7:50 AM M-F: 7am - 4pm

## 2023-02-13 LAB — GLUCOSE, CAPILLARY
Glucose-Capillary: 159 mg/dL — ABNORMAL HIGH (ref 70–99)
Glucose-Capillary: 213 mg/dL — ABNORMAL HIGH (ref 70–99)
Glucose-Capillary: 158 mg/dL — ABNORMAL HIGH (ref 70–99)
Glucose-Capillary: 148 mg/dL — ABNORMAL HIGH (ref 70–99)
Glucose-Capillary: 124 mg/dL — ABNORMAL HIGH (ref 70–99)
Glucose-Capillary: 139 mg/dL — ABNORMAL HIGH (ref 70–99)

## 2023-02-13 MED ORDER — ZINC CHLORIDE 1 MG/ML IV SOLN
INTRAVENOUS | Status: AC
  Filled 2023-02-13: qty 672

## 2023-02-13 NOTE — Plan of Care (Signed)
  Problem: Clinical Measurements: Goal: Ability to maintain clinical measurements within normal limits will improve 02/13/2023 1334 by Fredric Mare, RN Outcome: Progressing 02/13/2023 1334 by Fredric Mare, RN Reactivated Goal: Will remain free from infection 02/13/2023 1334 by Fredric Mare, RN Outcome: Progressing 02/13/2023 1334 by Fredric Mare, RN Reactivated Goal: Diagnostic test results will improve Reactivated Goal: Respiratory complications will improve 02/13/2023 1334 by Fredric Mare, RN Outcome: Progressing 02/13/2023 1334 by Fredric Mare, RN Reactivated Goal: Cardiovascular complication will be avoided 02/13/2023 1334 by Fredric Mare, RN Outcome: Progressing 02/13/2023 1334 by Fredric Mare, RN Reactivated

## 2023-02-13 NOTE — Progress Notes (Signed)
[facility] SURGICAL PROGRESS NOTE   Hospital Day(s): 365.   Post op day(s): 56 Days Post-Op.   Interval History:  Patient seen and examined No new or acute issues this AM No abdominal pain, nausea, emesis, fever No new labs this AM [name] output 50 ccs + unmeasured in last 24 hours; managing effluent independently No bleeding noted On cyclic TPN + soft diet  Ambulating well; independent  Review of Systems:  Constitutional: denies fever, chills  HEENT: denies cough or congestion  Respiratory: denies any shortness of breath  Cardiovascular: denies chest pain or palpitations  Gastrointestinal: denied abdominal pain, denied N/V Genitourinary: denies burning with urination or urinary frequency Integumentary: + midline wound (healing; stable)  Vital signs in last 24 hours: [min-max] current  Temp:  [98 F (36.7 C)-98.6 F (37 C)] 98.6 F (37 C) (06/07 0435) Pulse Rate:  [73-88] 88 (06/07 0435) Resp:  [16-18] 18 (06/07 0435) BP: (89-111)/(57-60) 97/58 (06/07 0435) SpO2:  [96 %-100 %] 96 % (06/07 0435) Weight:  [104.6 kg] 104.6 kg (06/07 0435)     Height: 4\' 11"  (149.9 cm) Weight: 104.6 kg BMI (Calculated): 46.02   Intake/Output last 2 shifts:  06/06 0701 - 06/07 0700 In: 191.8 [I.V.:191.8] Out: 50 [Stool:50]   Physical Exam:  Constitutional: alert, cooperative and no distress  Respiratory: breathing non-labored at rest  Cardiovascular: regular rate and sinus rhythm  Gastrointestinal: Soft, non-tender, non-distended, no rebound/guarding. Integumentary: Midline wound healing by secondary intention, peritoneum closed; granulating and new skin growing over majority of wound now; there are three stomatized areas visible in the LUQ and LLQ portions of the wound. No active bleeding Chest: Port to the right chest; no further swelling or drainage; no evidence of infection   Labs:     Latest Ref Rng & Units 02/02/2023    5:00 AM 01/27/2023    7:44 AM 01/19/2023     5:48 AM  CBC  WBC 4.0 - 10.5 K/uL 2.3  2.4  2.8   Hemoglobin 12.0 - 15.0 g/dL 8.3  7.5  7.4   Hematocrit 36.0 - 46.0 % 26.3  24.0  23.5   Platelets 150 - 400 K/uL 76  73  83       Latest Ref Rng & Units 02/09/2023    5:26 AM 02/06/2023    9:21 AM 02/02/2023    5:00 AM  CMP  Glucose 70 - 99 mg/dL 161  096  045   BUN 6 - 20 mg/dL 21  25  24    Creatinine 0.44 - 1.00 mg/dL 4.09  8.11  9.14   Sodium 135 - 145 mmol/L 131  136  134   Potassium 3.5 - 5.1 mmol/L 4.1  4.4  3.8   Chloride 98 - 111 mmol/L 102  105  104   CO2 22 - 32 mmol/L 25  25  27    Calcium 8.9 - 10.3 mg/dL 7.9  8.5  7.8   Total Protein 6.5 - 8.1 g/dL 5.6  7.0  5.6   Total Bilirubin 0.3 - 1.2 mg/dL 0.7  0.6  0.7   Alkaline Phos 38 - 126 U/L 148  161  138   AST 15 - 41 U/L 58  73  55   ALT 0 - 44 U/L 41  46  36     Imaging studies: No new imaging studies this AM   Assessment/Plan:  57 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  - New 06/07: Nothing acute   - Wound Care ([name] Pouch); Now on smaller [name] pouch; continues managing effluent independently. Intermittent oozing from time to time. Change as needed.    - Continue soft diet as tolerated  - Continue cyclic TPN; weekly nutritional labs - Appreciate dietary assistance - monitoring weight; now on daily Lasix as of 05/20             - Monitor abdominal examination; on-going bowel function            - Pain control prn; antemetic prn            - Progressed with therapies; no longer any recommendations; ambulating well    - Discharge Planning: Continue to anticipate lengthy admission +/- possible transfer; no new progress regarding  disposition unfortunately   All of the above findings and recommendations were discussed with the patient, and the medical team, and all of patient's questions were answered to her expressed satisfaction.  -- [name], PA-C [facility] 02/13/2023, 6:11 AM M-F: 7am - 4pm

## 2023-02-13 NOTE — Progress Notes (Signed)
PHARMACY - TOTAL PARENTERAL NUTRITION CONSULT NOTE   Indication: Prolonged ileus  Patient Measurements: Height: 4\' 11"  (149.9 cm) Weight: 104.6 kg (230 lb 9.6 oz) IBW/kg (Calculated) : 43.2 TPN AdjBW (KG): 57.8 Body mass index is 46.58 kg/m.  Assessment: Debra [name] is a 57 y.o. female s/p laparotomy, excision of greater omental mass, abdominal wall reconstruction with [name], appendectomy, and placement of Prevena vac.  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 --Furosemide 20 mg IV daily added on 5/20 Hepatic: Mild transaminitis GI Imaging: 9/11 CTAP: no new acute issues GI Surgeries / Procedures: s/p laparotomy, excision of greater omental mass, abdominal wall reconstruction with [name], appendectomy, and placement of Prevena vac 12/19/22 placement of right IJ port-a-catheter with dual reservoirs also s/p re-opening of laparotomy for repair of small bowel perforation   Central access: 02/15/22 TPN start date: 02/15/22  Nutritional Goals: Goal cyclic TPN over 16 hrs: cyclic TPN over 16 hrs: (provides 102 g of protein and 1811 kcals per day) total volume over 16hrs for calculations=1400 ml (1500 ml total with overfill)  RD Assessment:  Estimated Needs Total Energy Estimated Needs: 1800-2100kcal/day Total Protein Estimated Needs: 90-110g/d Total Fluid Estimated Needs: 1.4-1.6L/day  Current Nutrition:  Soft diet + nutritional supplements, not meeting PO needs  Plan: Transitioned to *Cyclic* TPN on 04/18/90.   to run over 16 hours per MD request. Debra [name] at 2000 per discussion with dietician to allow patient time for walking in afternoon/evenings.  To run over 16 hours: -Start rate at 49 mL/hr for 1 hour. -Increase rate to 98 mL/hr for 13 hours.  -Decrease rate to 49 mL/hr for 1  hour. -Decrease rate to 25 mL/hr for 1 hour, then stop.  Plan:  See cyclic TPN rate above Nutritional Components Amino acids (using 15% Clinisol): 101 g Dextrose 19% = 266 g Lipids (using 20% SMOFlipids): 50.4 g kCal: 1817/24h  Electrolytes in TPN: Na 75 mEq/L, K 50 mEq/L, Ca 29mEq/L, Mg 10 mEq/L, Phos 10 mmol/L, Cl:Ac 1:1  Lasix 20 mg IV daily since 5/20 Continue trace elements, MVI, chromium, zinc (increased to 7g on 5/16) and selenium (increased to 100mg  on 5/16) per dietary recs  Continue CBG/SSI for Cyclic TPN:  -CBG 2 hrs after cyclic TPN start -CBG during middle of cyclic TPN -CBG 1 hr after cylic TPN stopped -CBG while off TPN Continue SSI insulin at moderate scale and 5u insulin in TPN Check TPN labs weekly on Mondays and more frequently if indicated  Debra [name] 02/13/2023

## 2023-02-14 MED ORDER — ZINC CHLORIDE 1 MG/ML IV SOLN
INTRAVENOUS | Status: AC
  Filled 2023-02-14: qty 672

## 2023-02-14 NOTE — Progress Notes (Signed)
Patient ID: Debra [name], female   DOB: 1966/03/15, 57 y.o.   MRN: [id number]     SURGICAL PROGRESS NOTE   Hospital Day(s): 366.   Interval History: Patient seen and examined, no acute events or new complaints overnight. Patient reports feeling well.  She denies any new complaint.  Denies any significant pain.  Denies any issues with the diet.  Vital signs in last 24 hours: [min-max] current  Temp:  [97.8 F (36.6 C)-98.7 F (37.1 C)] 98.7 F (37.1 C) (06/08 0812) Pulse Rate:  [73-98] 73 (06/08 0812) Resp:  [18-20] 20 (06/08 0812) BP: (101-125)/(59-78) 101/59 (06/08 0812) SpO2:  [95 %-99 %] 95 % (06/08 0812) Weight:  [105.5 kg] 105.5 kg (06/08 0430)     Height: 4\' 11"  (149.9 cm) Weight: 105.5 kg BMI (Calculated): 46.02   Physical Exam:  Constitutional: alert, cooperative and no distress  Respiratory: breathing non-labored at rest  Cardiovascular: regular rate and sinus rhythm  Gastrointestinal: soft, non-tender, and non-distended.  Multiple fistula opening appreciated on anterior abdomen.  Labs:     Latest Ref Rng & Units 02/02/2023    5:00 AM 01/27/2023    7:44 AM 01/19/2023    5:48 AM  CBC  WBC 4.0 - 10.5 K/uL 2.3  2.4  2.8   Hemoglobin 12.0 - 15.0 g/dL 8.3  7.5  7.4   Hematocrit 36.0 - 46.0 % 26.3  24.0  23.5   Platelets 150 - 400 K/uL 76  73  83       Latest Ref Rng & Units 02/09/2023    5:26 AM 02/06/2023    9:21 AM 02/02/2023    5:00 AM  CMP  Glucose 70 - 99 mg/dL 409  811  914   BUN 6 - 20 mg/dL 21  25  24    Creatinine 0.44 - 1.00 mg/dL 7.82  9.56  2.13   Sodium 135 - 145 mmol/L 131  136  134   Potassium 3.5 - 5.1 mmol/L 4.1  4.4  3.8   Chloride 98 - 111 mmol/L 102  105  104   CO2 22 - 32 mmol/L 25  25  27    Calcium 8.9 - 10.3 mg/dL 7.9  8.5  7.8   Total Protein 6.5 - 8.1 g/dL 5.6  7.0  5.6   Total Bilirubin 0.3 - 1.2 mg/dL 0.7  0.6  0.7   Alkaline Phos 38 - 126 U/L 148  161  138   AST 15 - 41 U/L 58  73  55   ALT 0 - 44 U/L 41  46  36     Imaging  studies: No new pertinent imaging studies   Assessment/Plan:  57 y.o. female with enterocutaneous fistula  -Continue with persistent high output enterocutaneous fistula -Continue current management with TPN, local care -Encourage patient to ambulate -Continue current diet  Gae Gallop, MD

## 2023-02-14 NOTE — Progress Notes (Signed)
PHARMACY - TOTAL PARENTERAL NUTRITION CONSULT NOTE   Indication: Prolonged ileus  Patient Measurements: Height: 4\' 11"  (149.9 cm) Weight: 105.5 kg (232 lb 9.4 oz) IBW/kg (Calculated) : 43.2 TPN AdjBW (KG): 57.8 Body mass index is 46.98 kg/m.  Assessment: Debra [name] is a 57 y.o. female s/p laparotomy, excision of greater omental mass, abdominal wall reconstruction with [name], appendectomy, and placement of Prevena vac.  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 --Furosemide 20 mg IV daily added on 5/20 Hepatic: Mild transaminitis GI Imaging: 9/11 CTAP: no new acute issues GI Surgeries / Procedures: s/p laparotomy, excision of greater omental mass, abdominal wall reconstruction with [name], appendectomy, and placement of Prevena vac 12/19/22 placement of right IJ port-a-catheter with dual reservoirs also s/p re-opening of laparotomy for repair of small bowel perforation   Central access: 02/15/22 TPN start date: 02/15/22  Nutritional Goals: Goal cyclic TPN over 16 hrs: cyclic TPN over 16 hrs: (provides 102 g of protein and 1811 kcals per day) total volume over 16hrs for calculations=1400 ml (1500 ml total with overfill)  RD Assessment:  Estimated Needs Total Energy Estimated Needs: 1800-2100kcal/day Total Protein Estimated Needs: 90-110g/d Total Fluid Estimated Needs: 1.4-1.6L/day  Current Nutrition:  Soft diet + nutritional supplements, not meeting PO needs  Plan: Transitioned to *Cyclic* TPN on 7/82/95.   to run over 16 hours per MD request. [name] at 2000 per discussion with dietician to allow patient time for walking in afternoon/evenings.  To run over 16 hours: -Start rate at 49 mL/hr for 1 hour. -Increase rate to 98 mL/hr for 13 hours.  -Decrease rate to 49 mL/hr for 1  hour. -Decrease rate to 25 mL/hr for 1 hour, then stop.  Plan:  See cyclic TPN rate above Nutritional Components Amino acids (using 15% Clinisol): 101 g Dextrose 19% = 266 g Lipids (using 20% SMOFlipids): 50.4 g kCal: 1817/24h  Electrolytes in TPN: Na 75 mEq/L, K 50 mEq/L, Ca 68mEq/L, Mg 10 mEq/L, Phos 10 mmol/L, Cl:Ac 1:1  Lasix 20 mg IV daily since 5/20 Continue trace elements, MVI, chromium, zinc (increased to 7g on 5/16) and selenium (increased to 100mg  on 5/16) per dietary recs  Continue CBG/SSI for Cyclic TPN:  -CBG 2 hrs after cyclic TPN start -CBG during middle of cyclic TPN -CBG 1 hr after cylic TPN stopped -CBG while off TPN Continue SSI insulin at moderate scale and 5u insulin in TPN Check TPN labs weekly on Mondays and more frequently if indicated  [facility] A Zakiya Sporrer 02/14/2023

## 2023-02-15 LAB — GLUCOSE, CAPILLARY
Glucose-Capillary: 99 mg/dL (ref 70–99)
Glucose-Capillary: 176 mg/dL — ABNORMAL HIGH (ref 70–99)

## 2023-02-15 MED ORDER — ZINC CHLORIDE 1 MG/ML IV SOLN
INTRAVENOUS | Status: AC
  Filled 2023-02-15: qty 672

## 2023-02-15 NOTE — Progress Notes (Signed)
Patient ID: Tanis Hensarling, female   DOB: 04-11-1966, 57 y.o.   MRN: [id number]     SURGICAL PROGRESS NOTE   Hospital Day(s): 367.   Interval History: Patient seen and examined, no acute events or new complaints overnight. Patient reports feeling well.  She denies any new complaint.  She endorses tolerating diet.  No issues with the wound.  Vital signs in last 24 hours: [min-max] current  Temp:  [97.8 F (36.6 C)-98.5 F (36.9 C)] 98.3 F (36.8 C) (06/09 0913) Pulse Rate:  [71-83] 80 (06/09 0913) Resp:  [16-20] 19 (06/09 0913) BP: (99-116)/(59-64) 116/59 (06/09 0913) SpO2:  [96 %-100 %] 96 % (06/09 0913) Weight:  [105 kg] 105 kg (06/09 0415)     Height: 4\' 11"  (149.9 cm) Weight: 105 kg BMI (Calculated): 46.02   Physical Exam:  Constitutional: alert, cooperative and no distress  Respiratory: breathing non-labored at rest  Cardiovascular: regular rate and sinus rhythm  Gastrointestinal: soft, non-tender, and non-distended.  No change on anterior abdominal EC fistula.  Labs:     Latest Ref Rng & Units 02/02/2023    5:00 AM 01/27/2023    7:44 AM 01/19/2023    5:48 AM  CBC  WBC 4.0 - 10.5 K/uL 2.3  2.4  2.8   Hemoglobin 12.0 - 15.0 g/dL 8.3  7.5  7.4   Hematocrit 36.0 - 46.0 % 26.3  24.0  23.5   Platelets 150 - 400 K/uL 76  73  83       Latest Ref Rng & Units 02/09/2023    5:26 AM 02/06/2023    9:21 AM 02/02/2023    5:00 AM  CMP  Glucose 70 - 99 mg/dL 564  332  951   BUN 6 - 20 mg/dL 21  25  24    Creatinine 0.44 - 1.00 mg/dL 8.84  1.66  0.63   Sodium 135 - 145 mmol/L 131  136  134   Potassium 3.5 - 5.1 mmol/L 4.1  4.4  3.8   Chloride 98 - 111 mmol/L 102  105  104   CO2 22 - 32 mmol/L 25  25  27    Calcium 8.9 - 10.3 mg/dL 7.9  8.5  7.8   Total Protein 6.5 - 8.1 g/dL 5.6  7.0  5.6   Total Bilirubin 0.3 - 1.2 mg/dL 0.7  0.6  0.7   Alkaline Phos 38 - 126 U/L 148  161  138   AST 15 - 41 U/L 58  73  55   ALT 0 - 44 U/L 41  46  36     Imaging studies: No new pertinent  imaging studies   Assessment/Plan:  57 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

## 2023-02-15 NOTE — Progress Notes (Signed)
PHARMACY - TOTAL PARENTERAL NUTRITION CONSULT NOTE   Indication: Prolonged ileus  Patient Measurements: Height: 4\' 11"  (149.9 cm) Weight: 105 kg (231 lb 7.7 oz) IBW/kg (Calculated) : 43.2 TPN AdjBW (KG): 57.8 Body mass index is 46.75 kg/m.  Assessment: Debra [name] is a 57 y.o. female s/p laparotomy, excision of greater omental mass, abdominal wall reconstruction with [name], appendectomy, and placement of Prevena vac.  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 --Furosemide 20 mg IV daily added on 5/20 Hepatic: Mild transaminitis GI Imaging: 9/11 CTAP: no new acute issues GI Surgeries / Procedures: s/p laparotomy, excision of greater omental mass, abdominal wall reconstruction with [name], appendectomy, and placement of Prevena vac 12/19/22 placement of right IJ port-a-catheter with dual reservoirs also s/p re-opening of laparotomy for repair of small bowel perforation   Central access: 02/15/22 TPN start date: 02/15/22  Nutritional Goals: Goal cyclic TPN over 16 hrs: cyclic TPN over 16 hrs: (provides 102 g of protein and 1811 kcals per day) total volume over 16hrs for calculations=1400 ml (1500 ml total with overfill)  RD Assessment:  Estimated Needs Total Energy Estimated Needs: 1800-2100kcal/day Total Protein Estimated Needs: 90-110g/d Total Fluid Estimated Needs: 1.4-1.6L/day  Current Nutrition:  Soft diet + nutritional supplements, not meeting PO needs  Plan: Transitioned to *Cyclic* TPN on 1/61/09.   to run over 16 hours per MD request. Debra [name] at 2000 per discussion with dietician to allow patient time for walking in afternoon/evenings.  To run over 16 hours: -Start rate at 49 mL/hr for 1 hour. -Increase rate to 98 mL/hr for 13 hours.  -Decrease rate to 49 mL/hr for  1 hour. -Decrease rate to 25 mL/hr for 1 hour, then stop.  Plan:  See cyclic TPN rate above Nutritional Components Amino acids (using 15% Clinisol): 101 g Dextrose 19% = 266 g Lipids (using 20% SMOFlipids): 50.4 g kCal: 1817/24h  Electrolytes in TPN: Na 75 mEq/L, K 50 mEq/L, Ca 59mEq/L, Mg 10 mEq/L, Phos 10 mmol/L, Cl:Ac 1:1  Lasix 20 mg IV daily since 5/20 Continue trace elements, MVI, chromium, zinc (increased to 7g on 5/16) and selenium (increased to 100mg  on 5/16) per dietary recs  Continue CBG/SSI for Cyclic TPN:  -CBG 2 hrs after cyclic TPN start -CBG during middle of cyclic TPN -CBG 1 hr after cylic TPN stopped -CBG while off TPN Continue SSI insulin at moderate scale and 5u insulin in TPN Check TPN labs weekly on Mondays and more frequently if indicated  [facility] [name] 02/15/2023

## 2023-02-15 NOTE — Progress Notes (Signed)
Mobility Specialist - Progress Note    02/15/23 1735  Therapy Vitals  Temp 97.8 F (36.6 C)  Temp Source Oral  Pulse Rate 86  BP 120/78  Oxygen Therapy  SpO2 98 %  O2 Device Room Air  Mobility  Activity Ambulated independently to bathroom  Level of Assistance Independent  Assistive Device Four wheel walker  Distance Ambulated (ft) 10 ft  Activity Response Tolerated well  Mobility Referral Yes  $Mobility charge 1 Mobility  Mobility Specialist Start Time (ACUTE ONLY) 1700  Mobility Specialist Stop Time (ACUTE ONLY) 1735  Mobility Specialist Time Calculation (min) (ACUTE ONLY) 35 min   Pt resting in bathroom toilet upon entry. Pt STS and ambulates to recliner indep with RW. Pt vitals taken by Thereasa Parkin. Pt left with needs in reach

## 2023-02-16 LAB — COMPREHENSIVE METABOLIC PANEL
ALT: 39 U/L (ref 0–44)
AST: 65 U/L — ABNORMAL HIGH (ref 15–41)
Albumin: 2.4 g/dL — ABNORMAL LOW (ref 3.5–5.0)
Alkaline Phosphatase: 153 U/L — ABNORMAL HIGH (ref 38–126)
Anion gap: 5 (ref 5–15)
BUN: 22 mg/dL — ABNORMAL HIGH (ref 6–20)
CO2: 25 mmol/L (ref 22–32)
Calcium: 8.1 mg/dL — ABNORMAL LOW (ref 8.9–10.3)
Chloride: 104 mmol/L (ref 98–111)
Creatinine, Ser: 0.44 mg/dL (ref 0.44–1.00)
GFR, Estimated: >60 mL/min (ref >60)
Glucose, Bld: 203 mg/dL — ABNORMAL HIGH (ref 70–99)
Potassium: 4.2 mmol/L (ref 3.5–5.1)
Sodium: 134 mmol/L — ABNORMAL LOW (ref 135–145)
Total Bilirubin: 0.7 mg/dL (ref 0.3–1.2)
Total Protein: 5.8 g/dL — ABNORMAL LOW (ref 6.5–8.1)

## 2023-02-16 LAB — GLUCOSE, CAPILLARY
Glucose-Capillary: 186 mg/dL — ABNORMAL HIGH (ref 70–99)
Glucose-Capillary: 207 mg/dL — ABNORMAL HIGH (ref 70–99)
Glucose-Capillary: 122 mg/dL — ABNORMAL HIGH (ref 70–99)
Glucose-Capillary: 201 mg/dL — ABNORMAL HIGH (ref 70–99)
Glucose-Capillary: 193 mg/dL — ABNORMAL HIGH (ref 70–99)
Glucose-Capillary: 144 mg/dL — ABNORMAL HIGH (ref 70–99)
Glucose-Capillary: 186 mg/dL — ABNORMAL HIGH (ref 70–99)
Glucose-Capillary: 148 mg/dL — ABNORMAL HIGH (ref 70–99)
Glucose-Capillary: 156 mg/dL — ABNORMAL HIGH (ref 70–99)

## 2023-02-16 LAB — PHOSPHORUS: Phosphorus: 3 mg/dL (ref 2.5–4.6)

## 2023-02-16 LAB — MAGNESIUM: Magnesium: 2 mg/dL (ref 1.7–2.4)

## 2023-02-16 MED ORDER — ZINC CHLORIDE 1 MG/ML IV SOLN
INTRAVENOUS | Status: AC
  Filled 2023-02-16: qty 672

## 2023-02-16 NOTE — Progress Notes (Signed)
PHARMACY - TOTAL PARENTERAL NUTRITION CONSULT NOTE   Indication: Prolonged ileus  Patient Measurements: Height: 4\' 11"  (149.9 cm) Weight: 105.5 kg (232 lb 9.4 oz) IBW/kg (Calculated) : 43.2 TPN AdjBW (KG): 57.8 Body mass index is 46.98 kg/m.  Assessment: Debra [name] is a 57 y.o. female s/p laparotomy, excision of greater omental mass, abdominal wall reconstruction with [name], appendectomy, and placement of Prevena vac.  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 --Furosemide 20 mg IV daily added on 5/20 Hepatic: Mild transaminitis GI Imaging: 9/11 CTAP: no new acute issues GI Surgeries / Procedures: s/p laparotomy, excision of greater omental mass, abdominal wall reconstruction with [name], appendectomy, and placement of Prevena vac 12/19/22 placement of right IJ port-a-catheter with dual reservoirs also s/p re-opening of laparotomy for repair of small bowel perforation   Central access: 02/15/22 TPN start date: 02/15/22  Nutritional Goals: Goal cyclic TPN over 16 hrs: cyclic TPN over 16 hrs: (provides 102 g of protein and 1811 kcals per day) total volume over 16hrs for calculations=1400 ml (1500 ml total with overfill)  RD Assessment:  Estimated Needs Total Energy Estimated Needs: 1800-2100kcal/day Total Protein Estimated Needs: 90-110g/d Total Fluid Estimated Needs: 1.4-1.6L/day  Current Nutrition:  Soft diet + nutritional supplements, not meeting PO needs  Plan: Transitioned to *Cyclic* TPN on 1/61/09.   to run over 16 hours per MD request. [name] at 2000 per discussion with dietician to allow patient time for walking in afternoon/evenings.  To run over 16 hours: -Start rate at 49 mL/hr for 1 hour. -Increase rate to 98 mL/hr for 13 hours.  -Decrease rate to 49  mL/hr for 1 hour. -Decrease rate to 25 mL/hr for 1 hour, then stop.  Plan:  See cyclic TPN rate above Nutritional Components Amino acids (using 15% Clinisol): 101 g Dextrose 19% = 266 g Lipids (using 20% SMOFlipids): 50.4 g kCal: 1817/24h  Electrolytes in TPN: Na 75 mEq/L, K 50 mEq/L, Ca 13mEq/L, Mg 10 mEq/L, Phos 10 mmol/L, Cl:Ac 1:1  Lasix 20 mg IV daily since 5/20 Continue trace elements, MVI, chromium, zinc (increased to 7g on 5/16) and selenium (increased to 100mg  on 5/16) per dietary recs  Continue CBG/SSI for Cyclic TPN:  -CBG 2 hrs after cyclic TPN start -CBG during middle of cyclic TPN -CBG 1 hr after cylic TPN stopped -CBG while off TPN Continue SSI insulin at moderate scale and 5u insulin in TPN Check TPN labs weekly on Mondays and more frequently if indicated  [name] 02/16/2023

## 2023-02-16 NOTE — Progress Notes (Signed)
[facility] SURGICAL PROGRESS NOTE   Hospital Day(s): 368.   Post op day(s): 59 Days Post-Op.   Interval History:  Patient seen and examined No new or acute issues this AM No abdominal pain, nausea, emesis, fever Weekly labs are pending this morning  [name] output unmeasured in last 24 hours; managing effluent independently No bleeding noted On cyclic TPN + soft diet  Ambulating well; independent  Review of Systems:  Constitutional: denies fever, chills  HEENT: denies cough or congestion  Respiratory: denies any shortness of breath  Cardiovascular: denies chest pain or palpitations  Gastrointestinal: denied abdominal pain, denied N/V Genitourinary: denies burning with urination or urinary frequency Integumentary: + midline wound (healing; stable)  Vital signs in last 24 hours: [min-max] current  Temp:  [97.8 F (36.6 C)-98.5 F (36.9 C)] 98.4 F (36.9 C) (06/10 0505) Pulse Rate:  [73-86] 73 (06/10 0505) Resp:  [15-19] 18 (06/10 0505) BP: (105-120)/(59-78) 105/59 (06/10 0505) SpO2:  [96 %-99 %] 96 % (06/10 0505) Weight:  [105.5 kg] 105.5 kg (06/10 0500)     Height: 4\' 11"  (149.9 cm) Weight: 105.5 kg BMI (Calculated): 46.02   Intake/Output last 2 shifts:  06/09 0701 - 06/10 0700 In: 840 [P.O.:840] Out: -    Physical Exam:  Constitutional: alert, cooperative and no distress  Respiratory: breathing non-labored at rest  Cardiovascular: regular rate and sinus rhythm  Gastrointestinal: Soft, non-tender, non-distended, no rebound/guarding. Integumentary: Midline wound healing by secondary intention, peritoneum closed; granulating and new skin growing over majority of wound now; there are three stomatized areas visible in the LUQ and LLQ portions of the wound. No active bleeding Chest: Port to the right chest; no further swelling or drainage; no evidence of infection   Labs:     Latest Ref Rng & Units 02/02/2023    5:00 AM 01/27/2023    7:44 AM 01/19/2023     5:48 AM  CBC  WBC 4.0 - 10.5 K/uL 2.3  2.4  2.8   Hemoglobin 12.0 - 15.0 g/dL 8.3  7.5  7.4   Hematocrit 36.0 - 46.0 % 26.3  24.0  23.5   Platelets 150 - 400 K/uL 76  73  83       Latest Ref Rng & Units 02/09/2023    5:26 AM 02/06/2023    9:21 AM 02/02/2023    5:00 AM  CMP  Glucose 70 - 99 mg/dL 782  956  213   BUN 6 - 20 mg/dL 21  25  24    Creatinine 0.44 - 1.00 mg/dL 0.86  5.78  4.69   Sodium 135 - 145 mmol/L 131  136  134   Potassium 3.5 - 5.1 mmol/L 4.1  4.4  3.8   Chloride 98 - 111 mmol/L 102  105  104   CO2 22 - 32 mmol/L 25  25  27    Calcium 8.9 - 10.3 mg/dL 7.9  8.5  7.8   Total Protein 6.5 - 8.1 g/dL 5.6  7.0  5.6   Total Bilirubin 0.3 - 1.2 mg/dL 0.7  0.6  0.7   Alkaline Phos 38 - 126 U/L 148  161  138   AST 15 - 41 U/L 58  73  55   ALT 0 - 44 U/L 41  46  36     Imaging studies: No new imaging studies this AM   Assessment/Plan:  57 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  - New 06/10: Stable over the weekend; nothing new this AM  - Wound Care ([name] Pouch); Now on smaller [name] pouch; continues managing effluent independently. Intermittent oozing from time to time. Change as needed.    - Continue soft diet as tolerated  - Continue cyclic TPN; weekly nutritional labs - Appreciate dietary assistance - monitoring weight; now on daily Lasix as of 05/20             - Monitor abdominal examination; on-going bowel function            - Pain control prn; antemetic prn            - Progressed with therapies; no longer any recommendations; ambulating well    - Discharge Planning: Continue to anticipate lengthy admission +/- possible transfer; no  new progress regarding disposition unfortunately   All of the above findings and recommendations were discussed with the patient, and the medical team, and all of patient's questions were answered to her expressed satisfaction.  -- [name], PA-C [facility] 02/16/2023, 8:10 AM M-F: 7am - 4pm

## 2023-02-17 LAB — GLUCOSE, CAPILLARY
Glucose-Capillary: 102 mg/dL — ABNORMAL HIGH (ref 70–99)
Glucose-Capillary: 198 mg/dL — ABNORMAL HIGH (ref 70–99)
Glucose-Capillary: 148 mg/dL — ABNORMAL HIGH (ref 70–99)
Glucose-Capillary: 111 mg/dL — ABNORMAL HIGH (ref 70–99)
Glucose-Capillary: 113 mg/dL — ABNORMAL HIGH (ref 70–99)

## 2023-02-17 MED ORDER — ZINC CHLORIDE 1 MG/ML IV SOLN
INTRAVENOUS | Status: AC
  Filled 2023-02-17: qty 672

## 2023-02-17 NOTE — Progress Notes (Signed)
PHARMACY - TOTAL PARENTERAL NUTRITION CONSULT NOTE   Indication: Prolonged ileus  Patient Measurements: Height: 4\' 11"  (149.9 cm) Weight: 107.1 kg (236 lb 1.8 oz) IBW/kg (Calculated) : 43.2 TPN AdjBW (KG): 57.8 Body mass index is 47.69 kg/m.  Assessment: Debra [name] is a 57 y.o. female s/p laparotomy, excision of greater omental mass, abdominal wall reconstruction with [name], appendectomy, and placement of Prevena vac.  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 --Furosemide 20 mg IV daily added on 5/20 Hepatic: Mild transaminitis GI Imaging: 9/11 CTAP: no new acute issues GI Surgeries / Procedures: s/p laparotomy, excision of greater omental mass, abdominal wall reconstruction with [name], appendectomy, and placement of Prevena vac 12/19/22 placement of right IJ port-a-catheter with dual reservoirs also s/p re-opening of laparotomy for repair of small bowel perforation   Central access: 02/15/22 TPN start date: 02/15/22  Nutritional Goals: Goal cyclic TPN over 16 hrs: cyclic TPN over 16 hrs: (provides 102 g of protein and 1811 kcals per day) total volume over 16hrs for calculations=1400 ml (1500 ml total with overfill)  RD Assessment:  Estimated Needs Total Energy Estimated Needs: 1800-2100kcal/day Total Protein Estimated Needs: 90-110g/d Total Fluid Estimated Needs: 1.4-1.6L/day  Current Nutrition:  Soft diet + nutritional supplements, not meeting PO needs  Plan:  Transitioned to *Cyclic* TPN on 9/60/45.   to run over 16 hours per MD request. [name] at 2000 per discussion with dietician to allow patient time for walking in afternoon/evenings.  To run over 16 hours: -Start rate at 49 mL/hr for 1 hour. -Increase rate to 98 mL/hr for 13 hours.  -Decrease rate to  49 mL/hr for 1 hour. -Decrease rate to 25 mL/hr for 1 hour, then stop.  Plan:  See cyclic TPN rate above Nutritional Components Amino acids (using 15% Clinisol): 101 g Dextrose 19% = 266 g Lipids (using 20% SMOFlipids): 50.4 g kCal: 1817/24h  Electrolytes in TPN: Na 75 mEq/L, K 50 mEq/L, Ca 18mEq/L, Mg 10 mEq/L, Phos 10 mmol/L, Cl:Ac 1:1  Lasix 20 mg IV daily since 5/20 Continue trace elements, MVI, chromium, zinc (increased to 7g on 5/16) and selenium (increased to 100mg  on 5/16) per dietary recs  Continue CBG/SSI for Cyclic TPN:  -CBG 2 hrs after cyclic TPN start -CBG during middle of cyclic TPN -CBG 1 hr after cylic TPN stopped -CBG while off TPN Continue SSI insulin at moderate scale and 5u insulin in TPN Check TPN labs weekly on Mondays and more frequently if indicated  [name] 02/17/2023

## 2023-02-17 NOTE — Plan of Care (Signed)
  Problem: Clinical Measurements: Goal: Ability to maintain clinical measurements within normal limits will improve Outcome: Progressing Goal: Will remain free from infection Outcome: Progressing Goal: Diagnostic test results will improve Outcome: Progressing Goal: Respiratory complications will improve Outcome: Progressing Goal: Cardiovascular complication will be avoided Outcome: Progressing   Problem: Clinical Measurements: Goal: Will remain free from infection Outcome: Progressing   Problem: Nutrition: Goal: Adequate nutrition will be maintained Outcome: Progressing   Problem: Activity: Goal: Risk for activity intolerance will decrease Outcome: Progressing   Problem: Nutrition: Goal: Adequate nutrition will be maintained Outcome: Progressing   Problem: Coping: Goal: Level of anxiety will decrease Outcome: Progressing   Problem: Pain Managment: Goal: General experience of comfort will improve Outcome: Progressing

## 2023-02-17 NOTE — Progress Notes (Signed)
[facility] SURGICAL PROGRESS NOTE   Hospital Day(s): 369.   Post op day(s): 60 Days Post-Op.   Interval History:  Patient seen and examined No new or acute issues this AM No abdominal pain, nausea, emesis, fever Weekly labs are pending this morning  [name] output unmeasured in last 24 hours; managing effluent independently No bleeding noted On cyclic TPN + soft diet  Ambulating well; independent  Review of Systems:  Constitutional: denies fever, chills  HEENT: denies cough or congestion  Respiratory: denies any shortness of breath  Cardiovascular: denies chest pain or palpitations  Gastrointestinal: denied abdominal pain, denied N/V Genitourinary: denies burning with urination or urinary frequency Integumentary: + midline wound (healing; stable)  Vital signs in last 24 hours: [min-max] current  Temp:  [98.2 F (36.8 C)-98.7 F (37.1 C)] 98.2 F (36.8 C) (06/11 0426) Pulse Rate:  [74-80] 80 (06/11 0426) Resp:  [16-20] 20 (06/11 0426) BP: (107-116)/(59-62) 116/59 (06/11 0426) SpO2:  [98 %-100 %] 100 % (06/11 0426) Weight:  [107.1 kg] 107.1 kg (06/11 0214)     Height: 4\' 11"  (149.9 cm) Weight: 107.1 kg BMI (Calculated): 46.02   Intake/Output last 2 shifts:  06/10 0701 - 06/11 0700 In: 310.2 [I.V.:310.2] Out: -    Physical Exam:  Constitutional: alert, cooperative and no distress  Respiratory: breathing non-labored at rest  Cardiovascular: regular rate and sinus rhythm  Gastrointestinal: Soft, non-tender, non-distended, no rebound/guarding. Integumentary: Midline wound healing by secondary intention, peritoneum closed; granulating and new skin growing over majority of wound now; there are three stomatized areas visible in the LUQ and LLQ portions of the wound. No active bleeding. Piece of ST mesh noted.  Chest: Port to the right chest; no further swelling or drainage; no evidence of infection   Labs:     Latest Ref Rng & Units 02/02/2023    5:00 AM  01/27/2023    7:44 AM 01/19/2023    5:48 AM  CBC  WBC 4.0 - 10.5 K/uL 2.3  2.4  2.8   Hemoglobin 12.0 - 15.0 g/dL 8.3  7.5  7.4   Hematocrit 36.0 - 46.0 % 26.3  24.0  23.5   Platelets 150 - 400 K/uL 76  73  83       Latest Ref Rng & Units 02/16/2023    7:43 AM 02/09/2023    5:26 AM 02/06/2023    9:21 AM  CMP  Glucose 70 - 99 mg/dL 161  096  045   BUN 6 - 20 mg/dL 22  21  25    Creatinine 0.44 - 1.00 mg/dL 4.09  8.11  9.14   Sodium 135 - 145 mmol/L 134  131  136   Potassium 3.5 - 5.1 mmol/L 4.2  4.1  4.4   Chloride 98 - 111 mmol/L 104  102  105   CO2 22 - 32 mmol/L 25  25  25    Calcium 8.9 - 10.3 mg/dL 8.1  7.9  8.5   Total Protein 6.5 - 8.1 g/dL 5.8  5.6  7.0   Total Bilirubin 0.3 - 1.2 mg/dL 0.7  0.7  0.6   Alkaline Phos 38 - 126 U/L 153  148  161   AST 15 - 41 U/L 65  58  73   ALT 0 - 44 U/L 39  41  46     Imaging studies: No new imaging studies this AM   Assessment/Plan:  57 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  - New 06/11: Nothing acute, mesh noted; nothing to do  - Wound Care ([name] Pouch); Now on smaller [name] pouch; continues managing effluent independently. Intermittent oozing from time to time. Change as needed.    - Continue soft diet as tolerated  - Continue cyclic TPN; weekly nutritional labs - Appreciate dietary assistance - monitoring weight; now on daily Lasix as of 05/20             - Monitor abdominal examination; on-going bowel function            - Pain control prn; antemetic prn            - Progressed with therapies; no longer any recommendations; ambulating well    - Discharge Planning: Continue to anticipate lengthy  admission +/- possible transfer; no new progress regarding disposition unfortunately   All of the above findings and recommendations were discussed with the patient, and the medical team, and all of patient's questions were answered to her expressed satisfaction.  -- [name], PA-C [facility] 02/17/2023, 7:46 AM M-F: 7am - 4pm

## 2023-02-17 NOTE — Progress Notes (Signed)
Nutrition Follow-up  DOCUMENTATION CODES:   Obesity unspecified  INTERVENTION:   Continue cyclic TPN per pharmacy (16 hrs)- provides 1811kcal/day and 102g/day protein   Ensure Enlive po BID, each supplement provides 350 kcal and 20 grams of protein.  MVI, chromium and trace elements in TPN   Triglycerides checked monthly   Daily weights   Continue zinc additional 7mg  daily added to TPN (increased 5/16)  Continue Selenium additional daily added to TPN (increased 5/16)  Vitamin A 10,000 units po daily   Will check routine vitamin labs every 3 months, next due 7/11  NUTRITION DIAGNOSIS:   Increased nutrient needs related to wound healing, catabolic illness as evidenced by estimated needs. -ongoing   GOAL:   Patient will meet greater than or equal to 90% of their needs -met with TPN   MONITOR:   PO intake, Supplement acceptance, Labs, Weight trends, Diet advancement, I & O's, TPN  ASSESSMENT:   57 y/o female with h/o hypothyroidism, COVID 19 (3/21), kidney stones and stage 3 colon cancer (s/p left hemicolectomy 5/21 and chemotherapy) who is admitted for new pelvic mass now s/p laparotomy 6/8 (with excision of pelvic mass from greater omentum, abdominal wall reconstruction with bilateral myocutaneous flaps and mesh, incisional hernia repair, appendectomy repair and VAC placement) complicated by bowel perforation s/p reopening of recent laparotomy 6/10 (with repair of small bowel perforation, excision of mesh, placement of two phasix mesh and VAC placement). Pathology returned as metastatic adenocarcinoma. Pt with L1 compression fracture.   Pt s/p right IJ port 4/12  Pt continues to tolerate TPN well at goal rate; TPN is currently being cycled for 16 hrs via port. Triglycerides slightly elevated likely r/t increased blood glucoses; will recheck 6/17. Insulin added back in TPN; will monitor blood glucoses. Pt remains up >30lbs from her admit weight; PA aware. Pt  continues on lasix. Will check routine vitamin labs every 3 months as pt on chronic TPN (next due 7/11). Vitamins being repleted as needed. Pt continues to have poor oral intake; pt eating <25% of meals. [name] pouch in place. No discharge plan in place.   Medications reviewed and include: lasix, insulin, synthroid, protonix, vitamin A, TPN  -Selenium- 86(L), zinc- 45 wnl- 5/13 -Chromium- 2.0 wnl, copper- 109 wnl, Manganese- 17.9 wnl, vitamin D 74.99 wnl, Iodine 70 wnl, aluminum 10(H)- 4/12 -Iron 25(L), TIBC 398, folate 29.0 wnl, B12 922(H)- 4/12 -Ferritin 14- 5/13 -Vitamin B1- 73.2 wnl, B6- 8.9 wnl, vitamin C- 0.9 wnl, vitamin E- 12.6 wnl, vitamin K- 0.49 wnl- 4/12 Vitamin A- 14.1(L)- 5/13 -Carnitine 32.0 wnl- 4/12  Labs reviewed: Na 134(L), K 4.2 wnl, BUN 22(H), P 3.0 wnl, Mg 2.0 wnl- 6/10 Triglycerides- 151(H)- 6/3 Cbgs- 148, 198 x 24 hrs  Diet Order:    Diet Order             DIET SOFT Fluid consistency: Thin  Diet effective now                  EDUCATION NEEDS:   Not appropriate for education at this time  Skin:  Skin Assessment: Reviewed RN Assessment (Midline wound with EC fistula)  Last BM:  6/9- type 6  Height:   Ht Readings from Last 1 Encounters:  12/19/22 4\' 11"  (1.499 m)    Weight:   Wt Readings from Last 1 Encounters:  02/17/23 107.1 kg    Ideal Body Weight:  44.3 kg  BMI:  Body mass index is 47.69 kg/m.  Estimated Nutritional Needs:  Kcal:  1800-2100kcal/day  Protein:  90-110g/d  Fluid:  1.4-1.6L/day  Betsey Holiday MS, RD, LDN Please refer to [facility] for RD and/or RD on-call/weekend/after hours pager

## 2023-02-18 LAB — GLUCOSE, CAPILLARY
Glucose-Capillary: 122 mg/dL — ABNORMAL HIGH (ref 70–99)
Glucose-Capillary: 134 mg/dL — ABNORMAL HIGH (ref 70–99)
Glucose-Capillary: 196 mg/dL — ABNORMAL HIGH (ref 70–99)
Glucose-Capillary: 224 mg/dL — ABNORMAL HIGH (ref 70–99)
Glucose-Capillary: 242 mg/dL — ABNORMAL HIGH (ref 70–99)

## 2023-02-18 MED ORDER — ZINC CHLORIDE 1 MG/ML IV SOLN
INTRAVENOUS | Status: DC
  Filled 2023-02-18 (×2): qty 672

## 2023-02-18 MED ORDER — ZINC CHLORIDE 1 MG/ML IV SOLN
INTRAVENOUS | Status: AC
  Filled 2023-02-18: qty 672

## 2023-02-18 NOTE — Progress Notes (Signed)
Spoke with Dr. Everlene Farrier regarding drainage from lateral port. MD stated the lateral reservoir has a history of leakage. Recommends only accessing the medial side. IV RN (Rosilie) aware and will access medial port, and deaccess lateral HPN.

## 2023-02-18 NOTE — Progress Notes (Signed)
At bedside for HPN change. When deaccessing, a scant amount of thin pinkish/white drainage noted. Upon assessing, drainage appears to be from previous TPN infusion. No edema, odor, warmth noted from site. Afebrile. Pt denies pain. Reaccessed without difficult using sterile technique. No further drainage observed. RN made aware.

## 2023-02-18 NOTE — Progress Notes (Signed)
PHARMACY - TOTAL PARENTERAL NUTRITION CONSULT NOTE   Indication: Prolonged ileus  Patient Measurements: Height: 4\' 11"  (149.9 cm) Weight: 107.1 kg (236 lb 1.8 oz) IBW/kg (Calculated) : 43.2 TPN AdjBW (KG): 57.8 Body mass index is 47.69 kg/m.  Assessment: Debra [name] is a 57 y.o. female s/p laparotomy, excision of greater omental mass, abdominal wall reconstruction with [name], appendectomy, and placement of Prevena vac.  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 --Furosemide 20 mg IV daily added on 5/20 Hepatic: Mild transaminitis GI Imaging: 9/11 CTAP: no new acute issues GI Surgeries / Procedures: s/p laparotomy, excision of greater omental mass, abdominal wall reconstruction with [name], appendectomy, and placement of Prevena vac 12/19/22 placement of right IJ port-a-catheter with dual reservoirs also s/p re-opening of laparotomy for repair of small bowel perforation   Central access: 02/15/22 TPN start date: 02/15/22  Nutritional Goals: Goal cyclic TPN over 16 hrs: cyclic TPN over 16 hrs: (provides 102 g of protein and 1811 kcals per day) total volume over 16hrs for calculations=1400 ml (1500 ml total with overfill)  RD Assessment:  Estimated Needs Total Energy Estimated Needs: 1800-2100kcal/day Total Protein Estimated Needs: 90-110g/d Total Fluid Estimated Needs: 1.4-1.6L/day  Current Nutrition:  Soft diet + nutritional supplements, not meeting PO needs  Plan:  Transitioned to *Cyclic* TPN on 12/16/79.   to run over 16 hours per MD request. [name] at 2000 per discussion with dietician to allow patient time for walking in afternoon/evenings.  To run over 16 hours: -Start rate at 49 mL/hr for 1 hour. -Increase rate to 98 mL/hr for 13 hours.  -Decrease rate to 49  mL/hr for 1 hour. -Decrease rate to 25 mL/hr for 1 hour, then stop.  Plan:  See cyclic TPN rate above Nutritional Components Amino acids (using 15% Clinisol): 101 g Dextrose 19% = 266 g Lipids (using 20% SMOFlipids): 50.4 g kCal: 1817/24h  Electrolytes in TPN: Na 75 mEq/L, K 50 mEq/L, Ca 15mEq/L, Mg 10 mEq/L, Phos 10 mmol/L, Cl:Ac 1:1  Lasix 20 mg IV daily since 5/20 Continue trace elements, MVI, chromium, zinc (increased to 7g on 5/16) and selenium (increased to 100mg  on 5/16) per dietary recs  Continue CBG/SSI for Cyclic TPN:  -CBG 2 hrs after cyclic TPN start -CBG during middle of cyclic TPN -CBG 1 hr after cylic TPN stopped -CBG while off TPN Continue SSI insulin at moderate scale and 5u insulin in TPN Check TPN labs weekly on Mondays and more frequently if indicated  [name] 02/18/2023

## 2023-02-18 NOTE — Progress Notes (Signed)
[facility] SURGICAL PROGRESS NOTE   Hospital Day(s): 370.   Post op day(s): 61 Days Post-Op.   Interval History:  Patient seen and examined No new or acute issues this AM No abdominal pain, nausea, emesis, fever [name] output unmeasured in last 24 hours; managing effluent independently No bleeding noted On cyclic TPN + soft diet  Ambulating well; independent  Review of Systems:  Constitutional: denies fever, chills  HEENT: denies cough or congestion  Respiratory: denies any shortness of breath  Cardiovascular: denies chest pain or palpitations  Gastrointestinal: denied abdominal pain, denied N/V Genitourinary: denies burning with urination or urinary frequency Integumentary: + midline wound (healing; stable)  Vital signs in last 24 hours: [min-max] current  Temp:  [97.8 F (36.6 C)-98.5 F (36.9 C)] 98.5 F (36.9 C) (06/12 0503) Pulse Rate:  [74-75] 75 (06/12 0503) Resp:  [16-18] 18 (06/12 0503) BP: (106)/(58-63) 106/58 (06/12 0503) SpO2:  [97 %-100 %] 97 % (06/12 0503)     Height: 4\' 11"  (149.9 cm) Weight: 107.1 kg BMI (Calculated): 46.02   Intake/Output last 2 shifts:  06/11 0701 - 06/12 0700 In: 0 [I.V.:0] Out: -    Physical Exam:  Constitutional: alert, cooperative and no distress  Respiratory: breathing non-labored at rest  Cardiovascular: regular rate and sinus rhythm  Gastrointestinal: Soft, non-tender, non-distended, no rebound/guarding. Integumentary: Midline wound healing by secondary intention, peritoneum closed; granulating and new skin growing over majority of wound now; there are three stomatized areas visible in the LUQ and LLQ portions of the wound. No active bleeding. Piece of ST mesh noted.  Chest: Port to the right chest; no further swelling or drainage; no evidence of infection   Labs:     Latest Ref Rng & Units 02/02/2023    5:00 AM 01/27/2023    7:44 AM 01/19/2023    5:48 AM  CBC  WBC 4.0 - 10.5 K/uL 2.3  2.4  2.8    Hemoglobin 12.0 - 15.0 g/dL 8.3  7.5  7.4   Hematocrit 36.0 - 46.0 % 26.3  24.0  23.5   Platelets 150 - 400 K/uL 76  73  83       Latest Ref Rng & Units 02/16/2023    7:43 AM 02/09/2023    5:26 AM 02/06/2023    9:21 AM  CMP  Glucose 70 - 99 mg/dL 161  096  045   BUN 6 - 20 mg/dL 22  21  25    Creatinine 0.44 - 1.00 mg/dL 4.09  8.11  9.14   Sodium 135 - 145 mmol/L 134  131  136   Potassium 3.5 - 5.1 mmol/L 4.2  4.1  4.4   Chloride 98 - 111 mmol/L 104  102  105   CO2 22 - 32 mmol/L 25  25  25    Calcium 8.9 - 10.3 mg/dL 8.1  7.9  8.5   Total Protein 6.5 - 8.1 g/dL 5.8  5.6  7.0   Total Bilirubin 0.3 - 1.2 mg/dL 0.7  0.7  0.6   Alkaline Phos 38 - 126 U/L 153  148  161   AST 15 - 41 U/L 65  58  73   ALT 0 - 44 U/L 39  41  46     Imaging studies: No new imaging studies this AM   Assessment/Plan:  57 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  - New 06/12: Nothing acute  - Wound Care ([name] Pouch); Now on smaller [name] pouch; continues managing effluent independently. Intermittent oozing from time to time. Change as needed.    - Continue soft diet as tolerated  - Continue cyclic TPN; weekly nutritional labs - Appreciate dietary assistance - monitoring weight; now on daily Lasix as of 05/20             - Monitor abdominal examination; on-going bowel function            - Pain control prn; antemetic prn            - Progressed with therapies; no longer any recommendations; ambulating well    - Discharge Planning: Continue to anticipate lengthy admission +/- possible transfer; no new progress regarding disposition unfortunately   All of the above findings and  recommendations were discussed with the patient, and the medical team, and all of patient's questions were answered to her expressed satisfaction.  -- [name], PA-C [facility] 02/18/2023, 7:34 AM M-F: 7am - 4pm

## 2023-02-19 LAB — GLUCOSE, CAPILLARY
Glucose-Capillary: 216 mg/dL — ABNORMAL HIGH (ref 70–99)
Glucose-Capillary: 135 mg/dL — ABNORMAL HIGH (ref 70–99)
Glucose-Capillary: 173 mg/dL — ABNORMAL HIGH (ref 70–99)

## 2023-02-19 MED ORDER — ZINC CHLORIDE 1 MG/ML IV SOLN
INTRAVENOUS | Status: AC
  Filled 2023-02-19: qty 672

## 2023-02-19 NOTE — Plan of Care (Signed)
  Problem: Clinical Measurements: Goal: Will remain free from infection Outcome: Progressing   Problem: Activity: Goal: Risk for activity intolerance will decrease Outcome: Progressing   

## 2023-02-19 NOTE — Progress Notes (Signed)
PHARMACY - TOTAL PARENTERAL NUTRITION CONSULT NOTE   Indication: Prolonged ileus  Patient Measurements: Height: 4\' 11"  (149.9 cm) Weight: 104.9 kg (231 lb 4.2 oz) IBW/kg (Calculated) : 43.2 TPN AdjBW (KG): 57.8 Body mass index is 46.71 kg/m.  Assessment: Debra [name] is a 57 y.o. female s/p laparotomy, excision of greater omental mass, abdominal wall reconstruction with [name], appendectomy, and placement of Prevena vac.  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 --Furosemide 20 mg IV daily added on 5/20 Hepatic: Mild transaminitis GI Imaging: 9/11 CTAP: no new acute issues GI Surgeries / Procedures: s/p laparotomy, excision of greater omental mass, abdominal wall reconstruction with [name], appendectomy, and placement of Prevena vac 12/19/22 placement of right IJ port-a-catheter with dual reservoirs also s/p re-opening of laparotomy for repair of small bowel perforation   Central access: 02/15/22 TPN start date: 02/15/22  Nutritional Goals: Goal cyclic TPN over 16 hrs: cyclic TPN over 16 hrs: (provides 102 g of protein and 1811 kcals per day) total volume over 16hrs for calculations=1400 ml (1500 ml total with overfill)  RD Assessment:  Estimated Needs Total Energy Estimated Needs: 1800-2100kcal/day Total Protein Estimated Needs: 90-110g/d Total Fluid Estimated Needs: 1.4-1.6L/day  Current Nutrition:  Soft diet + nutritional supplements, not meeting PO needs  Plan:  Transitioned to *Cyclic* TPN on 1/61/09.   to run over 16 hours per MD request. [name] at 2000 per discussion with dietician to allow patient time for walking in afternoon/evenings.  To run over 16 hours: -Start rate at 49 mL/hr for 1 hour. -Increase rate to 98 mL/hr for 13 hours.  -Decrease rate to 49  mL/hr for 1 hour. -Decrease rate to 25 mL/hr for 1 hour, then stop.  Plan:  See cyclic TPN rate above Nutritional Components Amino acids (using 15% Clinisol): 101 g Dextrose 19% = 266 g Lipids (using 20% SMOFlipids): 50.4 g kCal: 1817/24h  Electrolytes in TPN: Na 75 mEq/L, K 50 mEq/L, Ca 59mEq/L, Mg 10 mEq/L, Phos 10 mmol/L, Cl:Ac 1:1  Lasix 20 mg IV daily since 5/20 Continue trace elements, MVI, chromium, zinc (increased to 7g on 5/16) and selenium (increased to 100mg  on 5/16) per dietary recs  Continue CBG/SSI for Cyclic TPN:  -CBG 2 hrs after cyclic TPN start -CBG during middle of cyclic TPN -CBG 1 hr after cylic TPN stopped -CBG while off TPN Continue SSI insulin at moderate scale and 5u insulin in TPN Check TPN labs weekly on Mondays and more frequently if indicated  Ronnald Ramp 02/19/2023

## 2023-02-19 NOTE — Progress Notes (Signed)
Pt seen and examined Had some leak from port last night, clarified w IV nurse.  Only medial reservoid to be used No further issues after access needle was changed to the correct reservoir Some scant ooze that stopped  PE  Midline wound healing by secondary intention, peritoneum closed; granulating and new skin growing over majority of wound now; there are three stomatized areas visible in the LUQ and LLQ portions of the wound. No active bleeding. Piece of phasic ST mesh noted.   A/P continue supportive care w tpn and eakin Labs in am

## 2023-02-20 LAB — COMPREHENSIVE METABOLIC PANEL
ALT: 32 U/L (ref 0–44)
AST: 48 U/L — ABNORMAL HIGH (ref 15–41)
Albumin: 2.4 g/dL — ABNORMAL LOW (ref 3.5–5.0)
Alkaline Phosphatase: 147 U/L — ABNORMAL HIGH (ref 38–126)
Anion gap: 5 (ref 5–15)
BUN: 23 mg/dL — ABNORMAL HIGH (ref 6–20)
CO2: 26 mmol/L (ref 22–32)
Calcium: 8.2 mg/dL — ABNORMAL LOW (ref 8.9–10.3)
Chloride: 106 mmol/L (ref 98–111)
Creatinine, Ser: 0.39 mg/dL — ABNORMAL LOW (ref 0.44–1.00)
GFR, Estimated: >60 mL/min (ref >60)
Glucose, Bld: 219 mg/dL — ABNORMAL HIGH (ref 70–99)
Potassium: 4.2 mmol/L (ref 3.5–5.1)
Sodium: 137 mmol/L (ref 135–145)
Total Bilirubin: 0.6 mg/dL (ref 0.3–1.2)
Total Protein: 5.7 g/dL — ABNORMAL LOW (ref 6.5–8.1)

## 2023-02-20 LAB — GLUCOSE, CAPILLARY
Glucose-Capillary: 168 mg/dL — ABNORMAL HIGH (ref 70–99)
Glucose-Capillary: 239 mg/dL — ABNORMAL HIGH (ref 70–99)
Glucose-Capillary: 181 mg/dL — ABNORMAL HIGH (ref 70–99)
Glucose-Capillary: 131 mg/dL — ABNORMAL HIGH (ref 70–99)
Glucose-Capillary: 175 mg/dL — ABNORMAL HIGH (ref 70–99)

## 2023-02-20 LAB — CBC
HCT: 27.7 % — ABNORMAL LOW (ref 36.0–46.0)
Hemoglobin: 8.8 g/dL — ABNORMAL LOW (ref 12.0–15.0)
MCH: 29.5 pg (ref 26.0–34.0)
MCHC: 31.8 g/dL (ref 30.0–36.0)
MCV: 93 fL (ref 80.0–100.0)
Platelets: 70 10*3/uL — ABNORMAL LOW (ref 150–400)
RBC: 2.98 MIL/uL — ABNORMAL LOW (ref 3.87–5.11)
RDW: 20 % — ABNORMAL HIGH (ref 11.5–15.5)
WBC: 2.2 10*3/uL — ABNORMAL LOW (ref 4.0–10.5)
nRBC: 0 % (ref 0.0–0.2)

## 2023-02-20 MED ORDER — ZINC CHLORIDE 1 MG/ML IV SOLN
INTRAVENOUS | Status: AC
  Filled 2023-02-20: qty 672

## 2023-02-20 NOTE — Plan of Care (Signed)
  Problem: Nutrition: Goal: Adequate nutrition will be maintained Outcome: Progressing   Problem: Activity: Goal: Risk for activity intolerance will decrease Outcome: Progressing   Problem: Pain Managment: Goal: General experience of comfort will improve Outcome: Progressing   Problem: Clinical Measurements: Goal: Respiratory complications will improve Outcome: Progressing

## 2023-02-20 NOTE — Progress Notes (Signed)
PHARMACY - TOTAL PARENTERAL NUTRITION CONSULT NOTE   Indication: Prolonged ileus  Patient Measurements: Height: 4\' 11"  (149.9 cm) Weight: 106.2 kg (234 lb 2.1 oz) IBW/kg (Calculated) : 43.2 TPN AdjBW (KG): 57.8 Body mass index is 47.29 kg/m.  Assessment: Debra [name] is a 57 y.o. female s/p laparotomy, excision of greater omental mass, abdominal wall reconstruction with Debra [name] release, appendectomy, and placement of Prevena vac.  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 --Furosemide 20 mg IV daily added on 5/20 Hepatic: Mild transaminitis GI Imaging: 9/11 CTAP: no new acute issues GI Surgeries / Procedures: s/p laparotomy, excision of greater omental mass, abdominal wall reconstruction with Debra [name] release, appendectomy, and placement of Prevena vac 12/19/22 placement of right IJ port-a-catheter with dual reservoirs also s/p re-opening of laparotomy for repair of small bowel perforation   Central access: 02/15/22 TPN start date: 02/15/22  Nutritional Goals: Goal cyclic TPN over 16 hrs: cyclic TPN over 16 hrs: (provides 102 g of protein and 1811 kcals per day) total volume over 16hrs for calculations=1400 ml (1500 ml total with overfill)  RD Assessment:  Estimated Needs Total Energy Estimated Needs: 1800-2100kcal/day Total Protein Estimated Needs: 90-110g/d Total Fluid Estimated Needs: 1.4-1.6L/day  Current Nutrition:  Soft diet + nutritional supplements, not meeting PO needs  Plan:  Transitioned to *Cyclic* TPN on 4/78/29.   to run over 16 hours per MD request. Debra [name] at 2000 per discussion with dietician to allow patient time for walking in afternoon/evenings.  To run over 16 hours: -Start rate at 49 mL/hr for 1 hour. -Increase rate to 98 mL/hr for 13 hours.  -Decrease rate to 49  mL/hr for 1 hour. -Decrease rate to 25 mL/hr for 1 hour, then stop.  Plan:  See cyclic TPN rate above Nutritional Components Amino acids (using 15% Clinisol): 101 g Dextrose 19% = 266 g Lipids (using 20% SMOFlipids): 50.4 g kCal: 1817/24h  Electrolytes in TPN: Na 75 mEq/L, K 50 mEq/L, Ca 84mEq/L, Mg 10 mEq/L, Phos 10 mmol/L, Cl:Ac 1:1  Lasix 20 mg IV daily since 5/20 Continue trace elements, MVI, chromium, zinc (increased to 7g on 5/16) and selenium (increased to 100mg  on 5/16) per dietary recs  Continue CBG/SSI for Cyclic TPN:  -CBG 2 hrs after cyclic TPN start -CBG during middle of cyclic TPN -CBG 1 hr after cylic TPN stopped -CBG while off TPN Continue SSI insulin at moderate scale and 5u insulin in TPN Check TPN labs weekly on Mondays and more frequently if indicated  Debra [name] A 02/20/2023

## 2023-02-20 NOTE — Progress Notes (Signed)
Pt Blood sugar at 239. Glucometer not sinking yet. Will continue to monitor.

## 2023-02-20 NOTE — Progress Notes (Signed)
CC: EC fistula  Subjective: Doing ok, having eakin pouch replaced daily  Hb 8.8 Objective: Vital signs in last 24 hours: Temp:  [97.9 F (36.6 C)-98.5 F (36.9 C)] 97.9 F (36.6 C) (06/14 0907) Pulse Rate:  [72-89] 72 (06/14 0907) Resp:  [16-18] 16 (06/14 0907) BP: (97-123)/(58-62) 105/58 (06/14 0907) SpO2:  [97 %-99 %] 97 % (06/14 0907) Weight:  [106.2 kg] 106.2 kg (06/14 0500) Last BM Date : 02/19/23  Intake/Output from previous day: 06/13 0701 - 06/14 0700 In: 1173.2 [P.O.:480; I.V.:693.2] Out: 250 [Drains:200; Stool:50] Intake/Output this shift: Total I/O In: 240 [P.O.:240] Out: -   Physical exam:  NAD alert Abd: soft, eakin pouch w/o leaks or active bleeding  Lab Results: CBC  Recent Labs    02/20/23 0915  WBC 2.2*  HGB 8.8*  HCT 27.7*  PLT 70*   BMET Recent Labs    02/20/23 0915  NA 137  K 4.2  CL 106  CO2 26  GLUCOSE 219*  BUN 23*  CREATININE 0.39*  CALCIUM 8.2*   PT/INR No results for input(s): "LABPROT", "INR" in the last 72 hours. ABG No results for input(s): "PHART", "HCO3" in the last 72 hours.  Invalid input(s): "PCO2", "PO2"  Studies/Results: No results found.  Anti-infectives: Anti-infectives (From admission, onward)    Start     Dose/Rate Route Frequency Ordered Stop   12/19/22 1000  ceFAZolin (ANCEF) IVPB 2g/100 mL premix        2 g 200 mL/hr over 30 Minutes Intravenous  Once 12/18/22 1738 12/19/22 1141   09/17/22 1200  vancomycin (VANCOREADY) IVPB 1250 mg/250 mL  Status:  Discontinued        1,250 mg 166.7 mL/hr over 90 Minutes Intravenous Every 24 hours 09/16/22 1040 09/22/22 1139   09/16/22 1200  vancomycin (VANCOREADY) IVPB 1250 mg/250 mL        1,250 mg 166.7 mL/hr over 90 Minutes Intravenous  Once 09/15/22 1706 09/16/22 1255   09/15/22 1200  ceFEPIme (MAXIPIME) 2 g in sodium chloride 0.9 % 100 mL IVPB  Status:  Discontinued        2 g 200 mL/hr over 30 Minutes Intravenous Every 8 hours 09/15/22 1024 09/18/22 1026    09/15/22 1200  vancomycin (VANCOREADY) IVPB 2000 mg/400 mL        2,000 mg 200 mL/hr over 120 Minutes Intravenous  Once 09/15/22 1024 09/15/22 1500   03/04/22 2200  piperacillin-tazobactam (ZOSYN) IVPB 3.375 g        3.375 g 12.5 mL/hr over 240 Minutes Intravenous Every 8 hours 03/04/22 2005 03/18/22 0248   02/21/22 1500  vancomycin (VANCOREADY) IVPB 1250 mg/250 mL  Status:  Discontinued        1,250 mg 166.7 mL/hr over 90 Minutes Intravenous Every 24 hours 02/21/22 1111 02/24/22 1336   02/21/22 1400  meropenem (MERREM) 1 g in sodium chloride 0.9 % 100 mL IVPB        1 g 200 mL/hr over 30 Minutes Intravenous Every 8 hours 02/21/22 1258 02/27/22 2157   02/20/22 1500  vancomycin (VANCOCIN) IVPB 1000 mg/200 mL premix  Status:  Discontinued        1,000 mg 200 mL/hr over 60 Minutes Intravenous Every 24 hours 02/19/22 1706 02/21/22 1111   02/19/22 1115  vancomycin (VANCOREADY) IVPB 2000 mg/400 mL        2,000 mg 200 mL/hr over 120 Minutes Intravenous  Once 02/19/22 1025 02/19/22 1732   02/19/22 1115  fluconazole (DIFLUCAN) IVPB 400 mg  400 mg 100 mL/hr over 120 Minutes Intravenous Every 24 hours 02/19/22 1025 03/04/22 1301   02/15/22 0730  piperacillin-tazobactam (ZOSYN) IVPB 3.375 g  Status:  Discontinued        3.375 g 12.5 mL/hr over 240 Minutes Intravenous Every 8 hours 02/15/22 0639 02/21/22 1235   02/15/22 0655  piperacillin-tazobactam (ZOSYN) 3.375 GM/50ML IVPB       Note to Pharmacy: Loretha Stapler N: cabinet override      02/15/22 0655 02/15/22 0816   02/13/22 2200  cefoTEtan (CEFOTAN) 2 g in sodium chloride 0.9 % 100 mL IVPB  Status:  Discontinued        2 g 200 mL/hr over 30 Minutes Intravenous Every 12 hours 02/13/22 1541 02/15/22 0639   02/13/22 1345  cefoTEtan (CEFOTAN) 2 g in sodium chloride 0.9 % 100 mL IVPB        2 g 200 mL/hr over 30 Minutes Intravenous  Once 02/13/22 1331 02/13/22 2146   02/13/22 1336  sodium chloride 0.9 % with cefoTEtan (CEFOTAN) ADS Med        Note to Pharmacy: Gates Rigg: cabinet override      02/13/22 1336 02/14/22 0144   02/13/22 0620  sodium chloride 0.9 % with cefoTEtan (CEFOTAN) ADS Med       Note to Pharmacy: Agnes Lawrence A: cabinet override      02/13/22 0620 02/13/22 0802   02/13/22 0600  cefoTEtan (CEFOTAN) 2 g in sodium chloride 0.9 % 100 mL IVPB        2 g 200 mL/hr over 30 Minutes Intravenous On call to O.R. 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

## 2023-02-21 MED ORDER — ZINC CHLORIDE 1 MG/ML IV SOLN
INTRAVENOUS | Status: AC
  Filled 2023-02-21: qty 672

## 2023-02-21 NOTE — Progress Notes (Signed)
PHARMACY - TOTAL PARENTERAL NUTRITION CONSULT NOTE   Indication: Prolonged ileus  Patient Measurements: Height: 4\' 11"  (149.9 cm) Weight: 106.8 kg (235 lb 7.2 oz) IBW/kg (Calculated) : 43.2 TPN AdjBW (KG): 57.8 Body mass index is 47.56 kg/m.  Assessment: Debra [name] is a 57 y.o. female s/p laparotomy, excision of greater omental mass, abdominal wall reconstruction with [name], appendectomy, and placement of Prevena vac.  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 --Furosemide 20 mg IV daily added on 5/20 Hepatic: Mild transaminitis GI Imaging: 9/11 CTAP: no new acute issues GI Surgeries / Procedures: s/p laparotomy, excision of greater omental mass, abdominal wall reconstruction with [name], appendectomy, and placement of Prevena vac 12/19/22 placement of right IJ port-a-catheter with dual reservoirs also s/p re-opening of laparotomy for repair of small bowel perforation   Central access: 02/15/22 TPN start date: 02/15/22  Nutritional Goals: Goal cyclic TPN over 16 hrs: cyclic TPN over 16 hrs: (provides 102 g of protein and 1811 kcals per day) total volume over 16hrs for calculations=1400 ml (1500 ml total with overfill)  RD Assessment:  Estimated Needs Total Energy Estimated Needs: 1800-2100kcal/day Total Protein Estimated Needs: 90-110g/d Total Fluid Estimated Needs: 1.4-1.6L/day  Current Nutrition:  Soft diet + nutritional supplements, not meeting PO needs  Plan:  Transitioned to *Cyclic* TPN on 1/61/09.   to run over 16 hours per MD request. [name] at 2000 per discussion with dietician to allow patient time for walking in afternoon/evenings.  To run over 16 hours: -Start rate at 49 mL/hr for 1 hour. -Increase rate to 98 mL/hr for 13 hours.  -Decrease rate to 49  mL/hr for 1 hour. -Decrease rate to 25 mL/hr for 1 hour, then stop.  Plan:  See cyclic TPN rate above Nutritional Components Amino acids (using 15% Clinisol): 101 g Dextrose 19% = 266 g Lipids (using 20% SMOFlipids): 50.4 g kCal: 1817/24h  Electrolytes in TPN: Na 75 mEq/L, K 50 mEq/L, Ca 36mEq/L, Mg 10 mEq/L, Phos 10 mmol/L, Cl:Ac 1:1  Lasix 20 mg IV daily since 5/20 Continue trace elements, MVI, chromium, zinc (increased to 7g on 5/16) and selenium (increased to 100mg  on 5/16) per dietary recs  Continue CBG/SSI for Cyclic TPN:  -CBG 2 hrs after cyclic TPN start -CBG during middle of cyclic TPN -CBG 1 hr after cylic TPN stopped -CBG while off TPN Continue SSI insulin at moderate scale and 5u insulin in TPN Check TPN labs weekly on Mondays and more frequently if indicated  [name] 02/21/2023

## 2023-02-21 NOTE — Progress Notes (Signed)
   02/21/23 1100  Spiritual Encounters  Type of Visit Follow up  Care provided to: Patient  Reason for visit Routine spiritual support  OnCall Visit Yes   Chaplain provided routine visit to long term patient.

## 2023-02-21 NOTE — Progress Notes (Signed)
Subjective:  CC: Debra [name] is a 56 y.o. female  Hospital stay day 373, 64 Days Post-Op placement of right IJ port-a-catheter with dual reservoirs also s/p re-opening of laparotomy for repair of small bowel perforation following initial laparotomy, excision of greater omental mass, abdominal wall reconstruction with [name], appendectomy, and placement of Prevena vac on 06/08.   HPI: No acute issues reported overnight.  ROS:  General: Denies weight loss, weight gain, fatigue, fevers, chills, and night sweats. Heart: Denies chest pain, palpitations, racing heart, irregular heartbeat, leg pain or swelling, and decreased activity tolerance. Respiratory: Denies breathing difficulty, shortness of breath, wheezing, cough, and sputum. GI: Denies change in appetite, heartburn, nausea, vomiting, constipation, diarrhea, and blood in stool. GU: Denies difficulty urinating, pain with urinating, urgency, frequency, blood in urine.   Objective:   Temp:  [97.9 F (36.6 C)-98.4 F (36.9 C)] 98 F (36.7 C) (06/15 0820) Pulse Rate:  [77-78] 77 (06/15 0820) Resp:  [16-18] 16 (06/15 0820) BP: (102-112)/(57-73) 112/58 (06/15 0820) SpO2:  [96 %-100 %] 98 % (06/15 0820) Weight:  [106.8 kg] 106.8 kg (06/15 0453)     Height: 4\' 11"  (149.9 cm) Weight: 106.8 kg BMI (Calculated): 46.02   Intake/Output this shift:   Intake/Output Summary (Last 24 hours) at 02/21/2023 1009 Last data filed at 02/21/2023 0523 Gross per 24 hour  Intake 976.68 ml  Output --  Net 976.68 ml    Constitutional :  alert, cooperative, appears stated age, and no distress  Respiratory:  clear to auscultation bilaterally  Cardiovascular:  regular rate and rhythm  Gastrointestinal: EC fistula stable, [name] pouch in place, no leak noted.  No active bleeding noted. .   Skin: Cool and moist.   Psychiatric: Normal affect, non-agitated, not confused       LABS:     Latest Ref Rng & Units 02/20/2023    9:15 AM  02/16/2023    7:43 AM 02/09/2023    5:26 AM  CMP  Glucose 70 - 99 mg/dL 161  096  045   BUN 6 - 20 mg/dL 23  22  21    Creatinine 0.44 - 1.00 mg/dL 4.09  8.11  9.14   Sodium 135 - 145 mmol/L 137  134  131   Potassium 3.5 - 5.1 mmol/L 4.2  4.2  4.1   Chloride 98 - 111 mmol/L 106  104  102   CO2 22 - 32 mmol/L 26  25  25    Calcium 8.9 - 10.3 mg/dL 8.2  8.1  7.9   Total Protein 6.5 - 8.1 g/dL 5.7  5.8  5.6   Total Bilirubin 0.3 - 1.2 mg/dL 0.6  0.7  0.7   Alkaline Phos 38 - 126 U/L 147  153  148   AST 15 - 41 U/L 48  65  58   ALT 0 - 44 U/L 32  39  41       Latest Ref Rng & Units 02/20/2023    9:15 AM 02/02/2023    5:00 AM 01/27/2023    7:44 AM  CBC  WBC 4.0 - 10.5 K/uL 2.2  2.3  2.4   Hemoglobin 12.0 - 15.0 g/dL 8.8  8.3  7.5   Hematocrit 36.0 - 46.0 % 27.7  26.3  24.0   Platelets 150 - 400 K/uL 70  76  73     RADS: N/a Assessment:   S/p placement of right IJ port-a-catheter with dual reservoirs also s/p re-opening of laparotomy  for repair of small bowel perforation following initial laparotomy, excision of greater omental mass, abdominal wall reconstruction with [name], appendectomy, and placement of Prevena vac on 06/08.   CPM  labs/images/medications/previous chart entries reviewed personally and relevant changes/updates noted above.

## 2023-02-22 MED ORDER — ZINC CHLORIDE 1 MG/ML IV SOLN
INTRAVENOUS | Status: AC
  Filled 2023-02-22: qty 672

## 2023-02-22 NOTE — Progress Notes (Signed)
PHARMACY - TOTAL PARENTERAL NUTRITION CONSULT NOTE   Indication: Prolonged ileus  Patient Measurements: Height: 4\' 11"  (149.9 cm) Weight: 107.2 kg (236 lb 5.3 oz) IBW/kg (Calculated) : 43.2 TPN AdjBW (KG): 57.8 Body mass index is 47.73 kg/m.  Assessment: Debra [name] is a 57 y.o. female s/p laparotomy, excision of greater omental mass, abdominal wall reconstruction with [name], appendectomy, and placement of Prevena vac.  Glucose / Insulin:  --No apparent history of diabetes --mSSI 4x/day (0500, 1300, 1800, 2200) --BG last 24h: No CBGs recorded --SSI last 24h: 12 units  Electrolytes: Mild, improved hyponatremia Renal: SCr stable at baseline --Furosemide 20 mg IV daily added on 5/20 Hepatic: Mild transaminitis GI Imaging: 9/11 CTAP: no new acute issues GI Surgeries / Procedures: s/p laparotomy, excision of greater omental mass, abdominal wall reconstruction with [name], appendectomy, and placement of Prevena vac 12/19/22 placement of right IJ port-a-catheter with dual reservoirs also s/p re-opening of laparotomy for repair of small bowel perforation   Central access: 02/15/22 TPN start date: 02/15/22  Nutritional Goals: Goal cyclic TPN over 16 hrs: cyclic TPN over 16 hrs: (provides 102 g of protein and 1811 kcals per day) total volume over 16hrs for calculations=1400 ml (1500 ml total with overfill)  RD Assessment:  Estimated Needs Total Energy Estimated Needs: 1800-2100kcal/day Total Protein Estimated Needs: 90-110g/d Total Fluid Estimated Needs: 1.4-1.6L/day  Current Nutrition:  Soft diet + nutritional supplements, not meeting PO needs  Plan:  Transitioned to *Cyclic* TPN on 6/57/84.   to run over 16 hours per MD request. [name] at 2000 per discussion with dietician to allow patient time for walking in afternoon/evenings.  To run over 16 hours: -Start rate at 49 mL/hr for 1 hour. -Increase rate to 98 mL/hr for 13 hours.  -Decrease  rate to 49 mL/hr for 1 hour. -Decrease rate to 25 mL/hr for 1 hour, then stop.  Plan:  See cyclic TPN rate above Nutritional Components Amino acids (using 15% Clinisol): 101 g Dextrose 19% = 266 g Lipids (using 20% SMOFlipids): 50.4 g kCal: 1817/24h  Electrolytes in TPN: Na 75 mEq/L, K 50 mEq/L, Ca 69mEq/L, Mg 10 mEq/L, Phos 10 mmol/L, Cl:Ac 1:1  Lasix 20 mg IV daily since 5/20 Continue trace elements, MVI, chromium, zinc (increased to 7g on 5/16) and selenium (increased to 100mg  on 5/16) per dietary recs  Continue CBG/SSI for Cyclic TPN:  -CBG 2 hrs after cyclic TPN start -CBG during middle of cyclic TPN -CBG 1 hr after cylic TPN stopped -CBG while off TPN Continue SSI insulin at moderate scale and 5u insulin in TPN Check TPN labs weekly on Mondays and more frequently if indicated  [name] 02/22/2023

## 2023-02-22 NOTE — Progress Notes (Signed)
Subjective:  CC: Debra [name] is a 57 y.o. female  Hospital stay day 374, 65 Days Post-Op placement of right IJ port-a-catheter with dual reservoirs also s/p re-opening of laparotomy for repair of small bowel perforation following initial laparotomy, excision of greater omental mass, abdominal wall reconstruction with [name], appendectomy, and placement of Prevena vac on 06/08.   HPI: No acute issues reported overnight.  ROS:  General: Denies weight loss, weight gain, fatigue, fevers, chills, and night sweats. Heart: Denies chest pain, palpitations, racing heart, irregular heartbeat, leg pain or swelling, and decreased activity tolerance. Respiratory: Denies breathing difficulty, shortness of breath, wheezing, cough, and sputum. GI: Denies change in appetite, heartburn, nausea, vomiting, constipation, diarrhea, and blood in stool. GU: Denies difficulty urinating, pain with urinating, urgency, frequency, blood in urine.   Objective:   Temp:  [98 F (36.7 C)-98.6 F (37 C)] 98.6 F (37 C) (06/16 0830) Pulse Rate:  [77-89] 77 (06/16 0830) Resp:  [16-20] 20 (06/16 0830) BP: (106-122)/(52-89) 114/57 (06/16 0830) SpO2:  [95 %-100 %] 95 % (06/16 0830) Weight:  [107.2 kg] 107.2 kg (06/16 0600)     Height: 4\' 11"  (149.9 cm) Weight: 107.2 kg BMI (Calculated): 46.02   Intake/Output this shift:   Intake/Output Summary (Last 24 hours) at 02/22/2023 1028 Last data filed at 02/21/2023 1903 Gross per 24 hour  Intake 781.6 ml  Output --  Net 781.6 ml    Constitutional :  alert, cooperative, appears stated age, and no distress  Respiratory:  clear to auscultation bilaterally  Cardiovascular:  regular rate and rhythm  Gastrointestinal: EC fistula stable, [name] pouch in place, no leak noted.  No active bleeding noted. .   Skin: Cool and moist.   Psychiatric: Normal affect, non-agitated, not confused       LABS:     Latest Ref Rng & Units 02/20/2023    9:15 AM 02/16/2023     7:43 AM 02/09/2023    5:26 AM  CMP  Glucose 70 - 99 mg/dL 161  096  045   BUN 6 - 20 mg/dL 23  22  21    Creatinine 0.44 - 1.00 mg/dL 4.09  8.11  9.14   Sodium 135 - 145 mmol/L 137  134  131   Potassium 3.5 - 5.1 mmol/L 4.2  4.2  4.1   Chloride 98 - 111 mmol/L 106  104  102   CO2 22 - 32 mmol/L 26  25  25    Calcium 8.9 - 10.3 mg/dL 8.2  8.1  7.9   Total Protein 6.5 - 8.1 g/dL 5.7  5.8  5.6   Total Bilirubin 0.3 - 1.2 mg/dL 0.6  0.7  0.7   Alkaline Phos 38 - 126 U/L 147  153  148   AST 15 - 41 U/L 48  65  58   ALT 0 - 44 U/L 32  39  41       Latest Ref Rng & Units 02/20/2023    9:15 AM 02/02/2023    5:00 AM 01/27/2023    7:44 AM  CBC  WBC 4.0 - 10.5 K/uL 2.2  2.3  2.4   Hemoglobin 12.0 - 15.0 g/dL 8.8  8.3  7.5   Hematocrit 36.0 - 46.0 % 27.7  26.3  24.0   Platelets 150 - 400 K/uL 70  76  73     RADS: N/a Assessment:   S/p placement of right IJ port-a-catheter with dual reservoirs also s/p re-opening of laparotomy  for repair of small bowel perforation following initial laparotomy, excision of greater omental mass, abdominal wall reconstruction with [name], appendectomy, and placement of Prevena vac on 06/08.   Continues to be stable over the weekend.  Continue present management.  labs/images/medications/previous chart entries reviewed personally and relevant changes/updates noted above.

## 2023-02-22 NOTE — Plan of Care (Signed)
  Problem: Clinical Measurements: Goal: Ability to maintain clinical measurements within normal limits will improve Outcome: Progressing Goal: Will remain free from infection Outcome: Progressing Goal: Diagnostic test results will improve Outcome: Progressing Goal: Respiratory complications will improve Outcome: Progressing Goal: Cardiovascular complication will be avoided Outcome: Progressing   Problem: Clinical Measurements: Goal: Will remain free from infection Outcome: Progressing   Problem: Nutrition: Goal: Adequate nutrition will be maintained Outcome: Progressing   Problem: Activity: Goal: Risk for activity intolerance will decrease Outcome: Progressing   Problem: Nutrition: Goal: Adequate nutrition will be maintained Outcome: Progressing   Problem: Coping: Goal: Level of anxiety will decrease Outcome: Progressing   Problem: Pain Managment: Goal: General experience of comfort will improve Outcome: Progressing   

## 2023-02-23 LAB — PHOSPHORUS: Phosphorus: 3 mg/dL (ref 2.5–4.6)

## 2023-02-23 LAB — GLUCOSE, CAPILLARY
Glucose-Capillary: 187 mg/dL — ABNORMAL HIGH (ref 70–99)
Glucose-Capillary: 150 mg/dL — ABNORMAL HIGH (ref 70–99)
Glucose-Capillary: 129 mg/dL — ABNORMAL HIGH (ref 70–99)
Glucose-Capillary: 202 mg/dL — ABNORMAL HIGH (ref 70–99)
Glucose-Capillary: 183 mg/dL — ABNORMAL HIGH (ref 70–99)
Glucose-Capillary: 122 mg/dL — ABNORMAL HIGH (ref 70–99)
Glucose-Capillary: 198 mg/dL — ABNORMAL HIGH (ref 70–99)
Glucose-Capillary: 237 mg/dL — ABNORMAL HIGH (ref 70–99)
Glucose-Capillary: 116 mg/dL — ABNORMAL HIGH (ref 70–99)
Glucose-Capillary: 118 mg/dL — ABNORMAL HIGH (ref 70–99)

## 2023-02-23 LAB — TRIGLYCERIDES: Triglycerides: 159 mg/dL — ABNORMAL HIGH (ref <150)

## 2023-02-23 LAB — MAGNESIUM: Magnesium: 2 mg/dL (ref 1.7–2.4)

## 2023-02-23 MED ORDER — ZINC CHLORIDE 1 MG/ML IV SOLN
INTRAVENOUS | Status: AC
  Filled 2023-02-23: qty 672

## 2023-02-23 NOTE — Progress Notes (Signed)
Pt seen and examined Doing well No further issues after access needle was changed to the correct reservoir AVSS   PE  Midline wound healing by secondary intention, peritoneum closed; granulating and new skin growing over majority of wound now; there are three stomatized areas visible in the LUQ and LLQ portions of the wound. No active bleeding. Piece of phasic ST mesh noted.    A/P continue supportive care w tpn and eakin

## 2023-02-23 NOTE — Progress Notes (Signed)
PHARMACY - TOTAL PARENTERAL NUTRITION CONSULT NOTE   Indication: Prolonged ileus  Patient Measurements: Height: 4\' 11"  (149.9 cm) Weight: 107 kg (235 lb 14.3 oz) IBW/kg (Calculated) : 43.2 TPN AdjBW (KG): 57.8 Body mass index is 47.64 kg/m.  Assessment: Jasma Leellen Chavero is a 57 y.o. female s/p laparotomy, excision of greater omental mass, abdominal wall reconstruction with [name], appendectomy, and placement of Prevena vac.  Glucose / Insulin:  --No apparent history of diabetes --mSSI 4x/day (0500, 1300, 1800, 2200) --BG last 24h: 122 - 198 --SSI last 24h: 6 units  Electrolytes:electrolytes wnl Renal: SCr stable at baseline Hepatic: Mild transaminitis GI Imaging: 9/11 CTAP: no new acute issues GI Surgeries / Procedures: s/p laparotomy, excision of greater omental mass, abdominal wall reconstruction with [name], appendectomy, and placement of Prevena vac 12/19/22 placement of right IJ port-a-catheter with dual reservoirs also s/p re-opening of laparotomy for repair of small bowel perforation   Central access: 02/15/22 TPN start date: 02/15/22  Nutritional Goals: Goal cyclic TPN over 16 hrs: cyclic TPN over 16 hrs: (provides 102 g of protein and 1811 kcals per day) total volume over 16hrs for calculations=1400 ml (1500 ml total with overfill)  RD Assessment:  Estimated Needs Total Energy Estimated Needs: 1800-2100kcal/day Total Protein Estimated Needs: 90-110g/d Total Fluid Estimated Needs: 1.4-1.6L/day  Current Nutrition:  Soft diet + nutritional supplements, not meeting PO needs  Plan:  Transitioned to *Cyclic* TPN on 12/16/79.   to run over 16 hours per MD request. [name] at 2000 per discussion with dietician to allow patient time for walking in afternoon/evenings.  To run over 16 hours: -Start rate at 49 mL/hr for 1 hour. -Increase rate to 98 mL/hr for 13 hours.  -Decrease rate to 49 mL/hr for 1 hour. -Decrease rate to 25 mL/hr for 1  hour, then stop.  Plan:  See cyclic TPN rate above Nutritional Components Amino acids (using 15% Clinisol): 101 g Dextrose 19% = 266 g Lipids (using 20% SMOFlipids): 50.4 g kCal: 1817/24h  Electrolytes in TPN: Na 75 mEq/L, K 50 mEq/L, Ca 73mEq/L, Mg 10 mEq/L, Phos 10 mmol/L, Cl:Ac 1:1  Continue trace elements, MVI, chromium, zinc (increased to 7g on 5/16) and selenium (increased to 100mg  on 5/16) per dietary recs  Continue CBG/SSI for Cyclic TPN:  -CBG 2 hrs after cyclic TPN start -CBG during middle of cyclic TPN -CBG 1 hr after cylic TPN stopped -CBG while off TPN Continue SSI insulin at moderate scale and 5u insulin in TPN Check TPN labs weekly on Mondays and more frequently if indicated  [name] 02/23/2023

## 2023-02-24 LAB — GLUCOSE, CAPILLARY
Glucose-Capillary: 112 mg/dL — ABNORMAL HIGH (ref 70–99)
Glucose-Capillary: 206 mg/dL — ABNORMAL HIGH (ref 70–99)
Glucose-Capillary: 230 mg/dL — ABNORMAL HIGH (ref 70–99)
Glucose-Capillary: 193 mg/dL — ABNORMAL HIGH (ref 70–99)
Glucose-Capillary: 146 mg/dL — ABNORMAL HIGH (ref 70–99)
Glucose-Capillary: 114 mg/dL — ABNORMAL HIGH (ref 70–99)
Glucose-Capillary: 192 mg/dL — ABNORMAL HIGH (ref 70–99)

## 2023-02-24 MED ORDER — STERILE WATER FOR INJECTION IV SOLN
INTRAVENOUS | Status: AC
  Filled 2023-02-24: qty 672

## 2023-02-24 NOTE — Progress Notes (Signed)
[facility] SURGICAL PROGRESS NOTE   Hospital Day(s): 376.   Post op day(s): 67 Days Post-Op.   Interval History:  Patient seen and examined No new or acute issues this AM Resting comfortably  No abdominal pain, nausea, emesis, fever [name] output 200 ccs + unmeasured in last 24 hours; managing effluent independently No bleeding noted On cyclic TPN + soft diet  Ambulating well; independent  Review of Systems:  Constitutional: denies fever, chills  HEENT: denies cough or congestion  Respiratory: denies any shortness of breath  Cardiovascular: denies chest pain or palpitations  Gastrointestinal: denied abdominal pain, denied N/V Genitourinary: denies burning with urination or urinary frequency Integumentary: + midline wound (healing; stable)  Vital signs in last 24 hours: [min-max] current  Temp:  [97.9 F (36.6 C)-98.4 F (36.9 C)] 98.4 F (36.9 C) (06/18 0404) Pulse Rate:  [76-92] 92 (06/18 0404) Resp:  [16-18] 18 (06/18 0404) BP: (109-133)/(60-82) 127/82 (06/18 0404) SpO2:  [96 %-100 %] 100 % (06/18 0404) Weight:  [106.8 kg] 106.8 kg (06/18 0500)     Height: 4\' 11"  (149.9 cm) Weight: 106.8 kg BMI (Calculated): 46.02   Intake/Output last 2 shifts:  06/17 0701 - 06/18 0700 In: 1087.1 [P.O.:500; I.V.:587.1] Out: 200 [Stool:200]   Physical Exam:  Constitutional: alert, cooperative and no distress  Respiratory: breathing non-labored at rest  Cardiovascular: regular rate and sinus rhythm  Gastrointestinal: Soft, non-tender, non-distended, no rebound/guarding. Integumentary: Midline wound healing by secondary intention, peritoneum closed; granulating and new skin growing over majority of wound now; there are three stomatized areas visible in the LUQ and LLQ portions of the wound. No active bleeding. Piece of ST mesh noted.  Chest: Port to the right chest; no further swelling or drainage; no evidence of infection   Labs:     Latest Ref Rng & Units  02/20/2023    9:15 AM 02/02/2023    5:00 AM 01/27/2023    7:44 AM  CBC  WBC 4.0 - 10.5 K/uL 2.2  2.3  2.4   Hemoglobin 12.0 - 15.0 g/dL 8.8  8.3  7.5   Hematocrit 36.0 - 46.0 % 27.7  26.3  24.0   Platelets 150 - 400 K/uL 70  76  73       Latest Ref Rng & Units 02/20/2023    9:15 AM 02/16/2023    7:43 AM 02/09/2023    5:26 AM  CMP  Glucose 70 - 99 mg/dL 161  096  045   BUN 6 - 20 mg/dL 23  22  21    Creatinine 0.44 - 1.00 mg/dL 4.09  8.11  9.14   Sodium 135 - 145 mmol/L 137  134  131   Potassium 3.5 - 5.1 mmol/L 4.2  4.2  4.1   Chloride 98 - 111 mmol/L 106  104  102   CO2 22 - 32 mmol/L 26  25  25    Calcium 8.9 - 10.3 mg/dL 8.2  8.1  7.9   Total Protein 6.5 - 8.1 g/dL 5.7  5.8  5.6   Total Bilirubin 0.3 - 1.2 mg/dL 0.6  0.7  0.7   Alkaline Phos 38 - 126 U/L 147  153  148   AST 15 - 41 U/L 48  65  58   ALT 0 - 44 U/L 32  39  41     Imaging studies: No new imaging studies this AM   Assessment/Plan:  57 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  - New 06/18: No acute changes this morning  - Wound Care ([name] Pouch); Now on smaller [name] pouch; continues managing effluent independently. Intermittent oozing from time to time. Change as needed.    - Continue soft diet as tolerated  - Continue cyclic TPN; weekly nutritional labs - Appreciate dietary assistance - monitoring weight; now on daily Lasix as of 05/20             - Monitor abdominal examination; on-going bowel function            - Pain control prn; antemetic prn            - Progressed with therapies; no longer any recommendations; ambulating well    - Discharge Planning: Continue to anticipate  lengthy admission +/- possible transfer; no new progress regarding disposition unfortunately   All of the above findings and recommendations were discussed with the patient, and the medical team, and all of patient's questions were answered to her expressed satisfaction.  -- [name], PA-C [facility] 02/24/2023, 7:43 AM M-F: 7am - 4pm

## 2023-02-24 NOTE — Progress Notes (Signed)
Nutrition Follow-up  DOCUMENTATION CODES:   Obesity unspecified  INTERVENTION:   Continue cyclic TPN per pharmacy (16 hrs)- provides 1811kcal/day and 102g/day protein   Ensure Enlive po BID, each supplement provides 350 kcal and 20 grams of protein.  Continue MVI, chromium and trace elements in TPN (hold 6/18 for labs)  Daily weights   Continue zinc additional 7mg  daily added to TPN (hold 6/18)  Continue Selenium additional daily added to TPN (hold 6/18)  Vitamin A 10,000 units po daily   Check selenium, zinc, vitamin A and ferritin labs   Will check routine vitamin labs every 3 months, next due 7/11  NUTRITION DIAGNOSIS:   Increased nutrient needs related to wound healing, catabolic illness as evidenced by estimated needs. -ongoing   GOAL:   Patient will meet greater than or equal to 90% of their needs -met with TPN   MONITOR:   PO intake, Supplement acceptance, Labs, Weight trends, Diet advancement, I & O's, TPN  ASSESSMENT:   57 y/o female with h/o hypothyroidism, COVID 19 (3/21), kidney stones and stage 3 colon cancer (s/p left hemicolectomy 5/21 and chemotherapy) who is admitted for new pelvic mass now s/p laparotomy 6/8 (with excision of pelvic mass from greater omentum, abdominal wall reconstruction with bilateral myocutaneous flaps and mesh, incisional hernia repair, appendectomy repair and VAC placement) complicated by bowel perforation s/p reopening of recent laparotomy 6/10 (with repair of small bowel perforation, excision of mesh, placement of two phasix mesh and VAC placement). Pathology returned as metastatic adenocarcinoma. Pt with L1 compression fracture.   Pt s/p right IJ port 4/12  Pt continues to tolerate TPN well at goal rate; TPN is currently being cycled for 16 hrs via port. Triglycerides slightly elevated likely r/t increased blood glucoses; will monitor. Insulin added back in TPN; will monitor blood glucoses. Pt remains up >30lbs from her  admit weight; PA aware. Pt continues on lasix. Will check routine vitamin labs every 3 months as pt on chronic TPN (next due 7/11). Vitamins being repleted as needed. Pt continues to have poor oral intake; pt eating <25% of meals. [name] pouch in place. No discharge plan in place.   Medications reviewed and include: lasix, insulin, synthroid, protonix, vitamin A, TPN  -Selenium- 86(L), zinc- 45 wnl- 5/13 -Chromium- 2.0 wnl, copper- 109 wnl, Manganese- 17.9 wnl, vitamin D 74.99 wnl, Iodine 70 wnl, aluminum 10(H)- 4/12 -Iron 25(L), TIBC 398, folate 29.0 wnl, B12 922(H)- 4/12 -Ferritin 14- 5/13 -Vitamin B1- 73.2 wnl, B6- 8.9 wnl, vitamin C- 0.9 wnl, vitamin E- 12.6 wnl, vitamin K- 0.49 wnl- 4/12 Vitamin A- 14.1(L)- 5/13 -Carnitine 32.0 wnl- 4/12  Labs reviewed: P 3.0 wnl, Mg 2.0 wnl- 6/17 Triglycerides- 159(H)- 6/17 Cbgs- 146, 193, 230, 206 x 24 hrs  Diet Order:    Diet Order             DIET SOFT Fluid consistency: Thin  Diet effective now                  EDUCATION NEEDS:   Not appropriate for education at this time  Skin:  Skin Assessment: Reviewed RN Assessment (Midline wound with EC fistula)  Last BM:  6/16  Height:   Ht Readings from Last 1 Encounters:  12/19/22 4\' 11"  (1.499 m)    Weight:   Wt Readings from Last 1 Encounters:  02/24/23 106.8 kg    Ideal Body Weight:  44.3 kg  BMI:  Body mass index is 47.56 kg/m.  Estimated Nutritional  Needs:   Kcal:  1800-2100kcal/day  Protein:  90-110g/d  Fluid:  1.4-1.6L/day  Betsey Holiday MS, RD, LDN Please refer to [facility] for RD and/or RD on-call/weekend/after hours pager

## 2023-02-24 NOTE — Progress Notes (Signed)
   02/24/23 1700  Spiritual Encounters  Type of Visit Follow up  Care provided to: Patient  Reason for visit Routine spiritual support  OnCall Visit Yes

## 2023-02-24 NOTE — Progress Notes (Signed)
PHARMACY - TOTAL PARENTERAL NUTRITION CONSULT NOTE   Indication: Prolonged ileus  Patient Measurements: Height: 4\' 11"  (149.9 cm) Weight: 106.8 kg (235 lb 7.2 oz) IBW/kg (Calculated) : 43.2 TPN AdjBW (KG): 57.8 Body mass index is 47.56 kg/m.  Assessment: Debra [name] is a 57 y.o. female s/p laparotomy, excision of greater omental mass, abdominal wall reconstruction with [name], appendectomy, and placement of Prevena vac.  Glucose / Insulin:  --No apparent history of diabetes --mSSI 4x/day (0500, 1300, 1800, 2200) --BG last 24h: 118 - 237 --SSI last 24h: 10 units  Electrolytes:electrolytes wnl Renal: SCr stable at baseline Hepatic: Mild transaminitis GI Imaging: 9/11 CTAP: no new acute issues GI Surgeries / Procedures: s/p laparotomy, excision of greater omental mass, abdominal wall reconstruction with [name], appendectomy, and placement of Prevena vac 12/19/22 placement of right IJ port-a-catheter with dual reservoirs also s/p re-opening of laparotomy for repair of small bowel perforation   Central access: 02/15/22 TPN start date: 02/15/22  Nutritional Goals: Goal cyclic TPN over 16 hrs: cyclic TPN over 16 hrs: (provides 102 g of protein and 1811 kcals per day) total volume over 16hrs for calculations=1400 ml (1500 ml total with overfill)  RD Assessment:  Estimated Needs Total Energy Estimated Needs: 1800-2100kcal/day Total Protein Estimated Needs: 90-110g/d Total Fluid Estimated Needs: 1.4-1.6L/day  Current Nutrition:  Soft diet + nutritional supplements, not meeting PO needs  Plan:  Transitioned to *Cyclic* TPN on 1/61/09.   to run over 16 hours per MD request. [name] at 2000 per discussion with dietician to allow patient time for walking in afternoon/evenings.  To run over 16 hours: -Start rate at 49 mL/hr for 1 hour. -Increase rate to 98 mL/hr for 13 hours.  -Decrease rate to 49 mL/hr for 1 hour. -Decrease rate to 25 mL/hr for 1  hour, then stop.  Plan:  See cyclic TPN rate above Nutritional Components Amino acids (using 15% Clinisol): 101 g Dextrose 19% = 266 g Lipids (using 20% SMOFlipids): 50.4 g kCal: 1817/24h  Electrolytes in TPN: Na 75 mEq/L, K 50 mEq/L, Ca 89mEq/L, Mg 10 mEq/L, Phos 10 mmol/L, Cl:Ac 1:1  remove trace elements, MVI, chromium, zinc (increased to 7g on 5/16) and selenium (increased to 100mg  on 5/16) per dietary recs  Continue CBG/SSI for Cyclic TPN:  -CBG 2 hrs after cyclic TPN start -CBG during middle of cyclic TPN -CBG 1 hr after cylic TPN stopped -CBG while off TPN Continue SSI insulin at moderate scale and 5u insulin in TPN Check TPN labs weekly on Mondays and more frequently if indicated  Debra [name] 02/24/2023

## 2023-02-25 LAB — GLUCOSE, CAPILLARY
Glucose-Capillary: 190 mg/dL — ABNORMAL HIGH (ref 70–99)
Glucose-Capillary: 170 mg/dL — ABNORMAL HIGH (ref 70–99)
Glucose-Capillary: 123 mg/dL — ABNORMAL HIGH (ref 70–99)
Glucose-Capillary: 95 mg/dL (ref 70–99)

## 2023-02-25 LAB — FERRITIN: Ferritin: 52 ng/mL (ref 11–307)

## 2023-02-25 MED ORDER — ZINC CHLORIDE 1 MG/ML IV SOLN
INTRAVENOUS | Status: AC
  Filled 2023-02-25: qty 672

## 2023-02-25 MED ORDER — ALTEPLASE 2 MG IJ SOLR
2.0000 mg | Freq: Once | INTRAMUSCULAR | Status: DC
  Filled 2023-02-25: qty 2

## 2023-02-25 NOTE — Progress Notes (Signed)
[facility] SURGICAL PROGRESS NOTE   Hospital Day(s): 377.   Post op day(s): 68 Days Post-Op.   Interval History:  Patient seen and examined Nothing new overnight; now in room 206 Resting comfortably  No abdominal pain, nausea, emesis, fever Miscellaneous nutritional/elemental labs pending this AM [name] output 100 ccs + unmeasured in last 24 hours; managing effluent independently No bleeding noted On cyclic TPN + soft diet  Ambulating well; independent  Review of Systems:  Constitutional: denies fever, chills  HEENT: denies cough or congestion  Respiratory: denies any shortness of breath  Cardiovascular: denies chest pain or palpitations  Gastrointestinal: denied abdominal pain, denied N/V Genitourinary: denies burning with urination or urinary frequency Integumentary: + midline wound (healing; stable)  Vital signs in last 24 hours: [min-max] current  Temp:  [98.1 F (36.7 C)-98.4 F (36.9 C)] 98.4 F (36.9 C) (06/19 0505) Pulse Rate:  [79-80] 80 (06/18 1700) Resp:  [18-19] 18 (06/19 0505) BP: (100-114)/(58-71) 105/58 (06/19 0505) SpO2:  [95 %-100 %] 95 % (06/19 0505) Weight:  [108.1 kg] 108.1 kg (06/19 0500)     Height: 4\' 11"  (149.9 cm) Weight: 108.1 kg BMI (Calculated): 46.02   Intake/Output last 2 shifts:  06/18 0701 - 06/19 0700 In: 1822.3 [P.O.:360; I.V.:1462.3] Out: 100 [Stool:100]   Physical Exam:  Constitutional: alert, cooperative and no distress  Respiratory: breathing non-labored at rest  Cardiovascular: regular rate and sinus rhythm  Gastrointestinal: Soft, non-tender, non-distended, no rebound/guarding. Integumentary: Midline wound healing by secondary intention, peritoneum closed; granulating and new skin growing over majority of wound now; there are three stomatized areas visible in the LUQ and LLQ portions of the wound. No active bleeding. Piece of ST mesh noted.  Chest: Port to the right chest; no further swelling or drainage; no  evidence of infection   Labs:     Latest Ref Rng & Units 02/20/2023    9:15 AM 02/02/2023    5:00 AM 01/27/2023    7:44 AM  CBC  WBC 4.0 - 10.5 K/uL 2.2  2.3  2.4   Hemoglobin 12.0 - 15.0 g/dL 8.8  8.3  7.5   Hematocrit 36.0 - 46.0 % 27.7  26.3  24.0   Platelets 150 - 400 K/uL 70  76  73       Latest Ref Rng & Units 02/20/2023    9:15 AM 02/16/2023    7:43 AM 02/09/2023    5:26 AM  CMP  Glucose 70 - 99 mg/dL 161  096  045   BUN 6 - 20 mg/dL 23  22  21    Creatinine 0.44 - 1.00 mg/dL 4.09  8.11  9.14   Sodium 135 - 145 mmol/L 137  134  131   Potassium 3.5 - 5.1 mmol/L 4.2  4.2  4.1   Chloride 98 - 111 mmol/L 106  104  102   CO2 22 - 32 mmol/L 26  25  25    Calcium 8.9 - 10.3 mg/dL 8.2  8.1  7.9   Total Protein 6.5 - 8.1 g/dL 5.7  5.8  5.6   Total Bilirubin 0.3 - 1.2 mg/dL 0.6  0.7  0.7   Alkaline Phos 38 - 126 U/L 147  153  148   AST 15 - 41 U/L 48  65  58   ALT 0 - 44 U/L 32  39  41     Imaging studies: No new imaging studies this AM   Assessment/Plan:  57 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  - New 06/19: No acute changes this morning  - Wound Care ([name] Pouch); Now on smaller [name] pouch; continues managing effluent independently. Intermittent oozing from time to time. Change as needed.    - Continue soft diet as tolerated  - Continue cyclic TPN; weekly nutritional labs - Appreciate dietary assistance - monitoring weight; now on daily Lasix as of 05/20             - Monitor abdominal examination; on-going bowel function            - Pain control prn; antemetic prn            - Progressed with therapies; no longer any recommendations;  ambulating well    - Discharge Planning: Continue to anticipate lengthy admission +/- possible transfer; no new progress regarding disposition unfortunately   All of the above findings and recommendations were discussed with the patient, and the medical team, and all of patient's questions were answered to her expressed satisfaction.  -- [name], PA-C [facility] 02/25/2023, 7:37 AM M-F: 7am - 4pm

## 2023-02-25 NOTE — Progress Notes (Signed)
PHARMACY - TOTAL PARENTERAL NUTRITION CONSULT NOTE   Indication: Prolonged ileus  Patient Measurements: Height: 4\' 11"  (149.9 cm) Weight: 108.1 kg (238 lb 5.1 oz) IBW/kg (Calculated) : 43.2 TPN AdjBW (KG): 57.8 Body mass index is 48.13 kg/m.  Assessment: Jeannifer Patricia Prusha is a 57 y.o. female s/p laparotomy, excision of greater omental mass, abdominal wall reconstruction with [name], appendectomy, and placement of Prevena vac.  Glucose / Insulin:  --No apparent history of diabetes --mSSI 4x/day (0500, 1300, 1800, 2200) --BG last 24h: 114 - 193 --SSI last 24h: 8 units  Electrolytes:electrolytes wnl Renal: SCr stable at baseline Hepatic: Mild transaminitis GI Imaging: 9/11 CTAP: no new acute issues GI Surgeries / Procedures: s/p laparotomy, excision of greater omental mass, abdominal wall reconstruction with [name], appendectomy, and placement of Prevena vac 12/19/22 placement of right IJ port-a-catheter with dual reservoirs also s/p re-opening of laparotomy for repair of small bowel perforation   Central access: 02/15/22 TPN start date: 02/15/22  Nutritional Goals: Goal cyclic TPN over 16 hrs: cyclic TPN over 16 hrs: (provides 102 g of protein and 1811 kcals per day) total volume over 16hrs for calculations=1400 ml (1500 ml total with overfill)  RD Assessment:  Estimated Needs Total Energy Estimated Needs: 1800-2100kcal/day Total Protein Estimated Needs: 90-110g/d Total Fluid Estimated Needs: 1.4-1.6L/day  Current Nutrition:  Soft diet + nutritional supplements, not meeting PO needs  Plan:  Transitioned to *Cyclic* TPN on 11/21/38.   to run over 16 hours per MD request. [name] at 2000 per discussion with dietician to allow patient time for walking in afternoon/evenings.  To run over 16 hours: -Start rate at 49 mL/hr for 1 hour. -Increase rate to 98 mL/hr for 13 hours.  -Decrease rate to 49 mL/hr for 1 hour. -Decrease rate to 25 mL/hr for 1  hour, then stop.  Plan:  See cyclic TPN rate above Nutritional Components Amino acids (using 15% Clinisol): 101 g Dextrose 19% = 266 g Lipids (using 20% SMOFlipids): 50.4 g kCal: 1817/24h  Electrolytes in TPN: Na 75 mEq/L, K 50 mEq/L, Ca 77mEq/L, Mg 10 mEq/L, Phos 10 mmol/L, Cl:Ac 1:1  remove trace elements, MVI, chromium, zinc (increased to 7g on 5/16) and selenium (increased to 100mg  on 5/16) per dietary recs  Continue CBG/SSI for Cyclic TPN:  -CBG 2 hrs after cyclic TPN start -CBG during middle of cyclic TPN -CBG 1 hr after cylic TPN stopped -CBG while off TPN Continue SSI insulin at moderate scale and 5u insulin in TPN Check TPN labs weekly on Mondays and more frequently if indicated  [name] 02/25/2023

## 2023-02-26 LAB — MISC LABCORP TEST (SEND OUT): Labcorp test code: 716910

## 2023-02-26 LAB — GLUCOSE, CAPILLARY
Glucose-Capillary: 216 mg/dL — ABNORMAL HIGH (ref 70–99)
Glucose-Capillary: 194 mg/dL — ABNORMAL HIGH (ref 70–99)
Glucose-Capillary: 112 mg/dL — ABNORMAL HIGH (ref 70–99)
Glucose-Capillary: 124 mg/dL — ABNORMAL HIGH (ref 70–99)

## 2023-02-26 MED ORDER — ZINC CHLORIDE 1 MG/ML IV SOLN
INTRAVENOUS | Status: AC
  Filled 2023-02-26: qty 672

## 2023-02-26 NOTE — Progress Notes (Signed)
PHARMACY - TOTAL PARENTERAL NUTRITION CONSULT NOTE   Indication: Prolonged ileus  Patient Measurements: Height: 4\' 11"  (149.9 cm) Weight: 106.2 kg (234 lb 2.1 oz) IBW/kg (Calculated) : 43.2 TPN AdjBW (KG): 57.8 Body mass index is 47.29 kg/m.  Assessment: Debra [name] is a 57 y.o. female s/p laparotomy, excision of greater omental mass, abdominal wall reconstruction with Debra [name] release, appendectomy, and placement of Prevena vac.  Glucose / Insulin:  --No apparent history of diabetes --mSSI 4x/day (0500, 1300, 1800, 2200) --BG last 24h: 95 - 216 --SSI last 24h: 10 units  Electrolytes:electrolytes wnl Renal: SCr stable at baseline Hepatic: Mild transaminitis GI Imaging: 9/11 CTAP: no new acute issues GI Surgeries / Procedures: s/p laparotomy, excision of greater omental mass, abdominal wall reconstruction with Debra [name] release, appendectomy, and placement of Prevena vac 12/19/22 placement of right IJ port-a-catheter with dual reservoirs also s/p re-opening of laparotomy for repair of small bowel perforation   Central access: 02/15/22 TPN start date: 02/15/22  Nutritional Goals: Goal cyclic TPN over 16 hrs: cyclic TPN over 16 hrs: (provides 102 g of protein and 1811 kcals per day) total volume over 16hrs for calculations=1400 ml (1500 ml total with overfill)  RD Assessment:  Estimated Needs Total Energy Estimated Needs: 1800-2100kcal/day Total Protein Estimated Needs: 90-110g/d Total Fluid Estimated Needs: 1.4-1.6L/day  Current Nutrition:  Soft diet + nutritional supplements, not meeting PO needs  Plan:  Transitioned to *Cyclic* TPN on 1/61/09.   to run over 16 hours per MD request. Debra [name] at 2000 per discussion with dietician to allow patient time for walking in afternoon/evenings.  To run over 16 hours: -Start rate at 49 mL/hr for 1 hour. -Increase rate to 98 mL/hr for 13 hours.  -Decrease rate to 49 mL/hr for 1 hour. -Decrease rate to 25 mL/hr for 1  hour, then stop.  Plan:  See cyclic TPN rate above Nutritional Components Amino acids (using 15% Clinisol): 101 g Dextrose 19% = 266 g Lipids (using 20% SMOFlipids): 50.4 g kCal: 1817/24h  Electrolytes in TPN: Na 75 mEq/L, K 50 mEq/L, Ca 27mEq/L, Mg 10 mEq/L, Phos 10 mmol/L, Cl:Ac 1:1  remove trace elements, MVI, chromium, zinc (increased to 7g on 5/16) and selenium (increased to 100mg  on 5/16) per dietary recs  Continue CBG/SSI for Cyclic TPN:  -CBG 2 hrs after cyclic TPN start -CBG during middle of cyclic TPN -CBG 1 hr after cylic TPN stopped -CBG while off TPN Continue SSI insulin at moderate scale and 5u insulin in TPN Check TPN labs weekly on Mondays and more frequently if indicated  Debra [name] 02/26/2023

## 2023-02-26 NOTE — Progress Notes (Signed)
[facility] SURGICAL PROGRESS NOTE   Hospital Day(s): 378.   Post op day(s): 69 Days Post-Op.   Interval History:  Patient seen and examined Nothing new overnight Resting comfortably  No abdominal pain, nausea, emesis, fever No new labs this morning [name] output unmeasured in last 24 hours; managing effluent independently No bleeding noted On cyclic TPN + soft diet  Ambulating well; independent  Review of Systems:  Constitutional: denies fever, chills  HEENT: denies cough or congestion  Respiratory: denies any shortness of breath  Cardiovascular: denies chest pain or palpitations  Gastrointestinal: denied abdominal pain, denied N/V Genitourinary: denies burning with urination or urinary frequency Integumentary: + midline wound (healing; stable)  Vital signs in last 24 hours: [min-max] current  Temp:  [98 F (36.7 C)] 98 F (36.7 C) (06/20 0447) Pulse Rate:  [70-73] 72 (06/20 0447) Resp:  [17-18] 17 (06/20 0447) BP: (102-112)/(55-61) 110/61 (06/20 0447) SpO2:  [97 %-100 %] 100 % (06/20 0447) Weight:  [106.2 kg] 106.2 kg (06/20 0500)     Height: 4\' 11"  (149.9 cm) Weight: 106.2 kg BMI (Calculated): 46.02   Intake/Output last 2 shifts:  06/19 0701 - 06/20 0700 In: 663.4 [I.V.:663.4] Out: -    Physical Exam:  Constitutional: alert, cooperative and no distress  Respiratory: breathing non-labored at rest  Cardiovascular: regular rate and sinus rhythm  Gastrointestinal: Soft, non-tender, non-distended, no rebound/guarding. Integumentary: Midline wound healing by secondary intention, peritoneum closed; granulating and new skin growing over majority of wound now; there are three stomatized areas visible in the LUQ and LLQ portions of the wound. No active bleeding. Piece of ST mesh noted.  Chest: Port to the right chest; no further swelling or drainage; no evidence of infection   Labs:     Latest Ref Rng & Units 02/20/2023    9:15 AM 02/02/2023    5:00 AM  01/27/2023    7:44 AM  CBC  WBC 4.0 - 10.5 K/uL 2.2  2.3  2.4   Hemoglobin 12.0 - 15.0 g/dL 8.8  8.3  7.5   Hematocrit 36.0 - 46.0 % 27.7  26.3  24.0   Platelets 150 - 400 K/uL 70  76  73       Latest Ref Rng & Units 02/20/2023    9:15 AM 02/16/2023    7:43 AM 02/09/2023    5:26 AM  CMP  Glucose 70 - 99 mg/dL 629  528  413   BUN 6 - 20 mg/dL 23  22  21    Creatinine 0.44 - 1.00 mg/dL 2.44  0.10  2.72   Sodium 135 - 145 mmol/L 137  134  131   Potassium 3.5 - 5.1 mmol/L 4.2  4.2  4.1   Chloride 98 - 111 mmol/L 106  104  102   CO2 22 - 32 mmol/L 26  25  25    Calcium 8.9 - 10.3 mg/dL 8.2  8.1  7.9   Total Protein 6.5 - 8.1 g/dL 5.7  5.8  5.6   Total Bilirubin 0.3 - 1.2 mg/dL 0.6  0.7  0.7   Alkaline Phos 38 - 126 U/L 147  153  148   AST 15 - 41 U/L 48  65  58   ALT 0 - 44 U/L 32  39  41     Imaging studies: No new imaging studies this AM   Assessment/Plan:  57 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  - New 06/20: Nothing acute   - Wound Care ([name] Pouch); Now on smaller [name] pouch; continues managing effluent independently. Intermittent oozing from time to time. Change as needed.    - Continue soft diet as tolerated  - Continue cyclic TPN; weekly nutritional labs - Appreciate dietary assistance - monitoring weight; now on daily Lasix as of 05/20             - Monitor abdominal examination; on-going bowel function            - Pain control prn; antemetic prn            - Progressed with therapies; no longer any recommendations; ambulating well    - Discharge Planning: Continue to anticipate lengthy admission +/- possible transfer; no new progress  regarding disposition unfortunately   All of the above findings and recommendations were discussed with the patient, and the medical team, and all of patient's questions were answered to her expressed satisfaction.  -- [name], PA-C [facility] 02/26/2023, 7:39 AM M-F: 7am - 4pm

## 2023-02-27 LAB — GLUCOSE, CAPILLARY
Glucose-Capillary: 202 mg/dL — ABNORMAL HIGH (ref 70–99)
Glucose-Capillary: 165 mg/dL — ABNORMAL HIGH (ref 70–99)
Glucose-Capillary: 122 mg/dL — ABNORMAL HIGH (ref 70–99)
Glucose-Capillary: 118 mg/dL — ABNORMAL HIGH (ref 70–99)

## 2023-02-27 LAB — VITAMIN A: Vitamin A (Retinoic Acid): 14.3 ug/dL — ABNORMAL LOW (ref 20.1–62.0)

## 2023-02-27 LAB — ZINC: Zinc: 43 ug/dL — ABNORMAL LOW (ref 44–115)

## 2023-02-27 MED ORDER — ZINC CHLORIDE 1 MG/ML IV SOLN
INTRAVENOUS | Status: AC
  Filled 2023-02-27: qty 672

## 2023-02-27 NOTE — Progress Notes (Signed)
PHARMACY - TOTAL PARENTERAL NUTRITION CONSULT NOTE   Indication: Prolonged ileus  Patient Measurements: Height: 4\' 11"  (149.9 cm) Weight: 106.2 kg (234 lb 2.1 oz) IBW/kg (Calculated) : 43.2 TPN AdjBW (KG): 57.8 Body mass index is 47.29 kg/m.  Assessment: Debra [name] is a 57 y.o. female s/p laparotomy, excision of greater omental mass, abdominal wall reconstruction with [name], appendectomy, and placement of Prevena vac.  Glucose / Insulin:  --No apparent history of diabetes --mSSI 4x/day (0500, 1300, 1800, 2200) --BG last 24h: 112 - 202 --SSI last 24h: 10 units  Electrolytes:electrolytes wnl Renal: SCr stable at baseline Hepatic: Mild transaminitis GI Imaging: 9/11 CTAP: no new acute issues GI Surgeries / Procedures: s/p laparotomy, excision of greater omental mass, abdominal wall reconstruction with [name], appendectomy, and placement of Prevena vac 12/19/22 placement of right IJ port-a-catheter with dual reservoirs also s/p re-opening of laparotomy for repair of small bowel perforation   Central access: 02/15/22 TPN start date: 02/15/22  Nutritional Goals: Goal cyclic TPN over 16 hrs: cyclic TPN over 16 hrs: (provides 102 g of protein and 1811 kcals per day) total volume over 16hrs for calculations=1400 ml (1500 ml total with overfill)  RD Assessment:  Estimated Needs Total Energy Estimated Needs: 1800-2100kcal/day Total Protein Estimated Needs: 90-110g/d Total Fluid Estimated Needs: 1.4-1.6L/day  Current Nutrition:  Soft diet + nutritional supplements, not meeting PO needs  Plan:  Transitioned to *Cyclic* TPN on 1/61/09.   to run over 16 hours per MD request. [name] at 2000 per discussion with dietician to allow patient time for walking in afternoon/evenings.  To run over 16 hours: -Start rate at 49 mL/hr for 1 hour. -Increase rate to 98 mL/hr for 13 hours.  -Decrease rate to 49 mL/hr for 1 hour. -Decrease rate to 25 mL/hr for 1  hour, then stop.  Plan:  See cyclic TPN rate above Nutritional Components Amino acids (using 15% Clinisol): 101 g Dextrose 19% = 266 g Lipids (using 20% SMOFlipids): 50.4 g kCal: 1817/24h  Electrolytes in TPN: Na 75 mEq/L, K 50 mEq/L, Ca 43mEq/L, Mg 10 mEq/L, Phos 10 mmol/L, Cl:Ac 1:1  remove trace elements, MVI, chromium, zinc (increased to 7g on 5/16) and selenium (increased to 100mg  on 5/16) per dietary recs  Continue CBG/SSI for Cyclic TPN:  -CBG 2 hrs after cyclic TPN start -CBG during middle of cyclic TPN -CBG 1 hr after cylic TPN stopped -CBG while off TPN Continue SSI insulin at moderate scale and 5u insulin in TPN Check TPN labs weekly on Mondays and more frequently if indicated  Debra [name] 02/27/2023

## 2023-02-27 NOTE — Plan of Care (Signed)
  Problem: Clinical Measurements: Goal: Ability to maintain clinical measurements within normal limits will improve Outcome: Progressing Goal: Will remain free from infection Outcome: Progressing Goal: Diagnostic test results will improve Outcome: Progressing Goal: Respiratory complications will improve Outcome: Progressing Goal: Cardiovascular complication will be avoided Outcome: Progressing   Problem: Clinical Measurements: Goal: Will remain free from infection Outcome: Progressing   Problem: Nutrition: Goal: Adequate nutrition will be maintained Outcome: Progressing   Problem: Activity: Goal: Risk for activity intolerance will decrease Outcome: Progressing   Problem: Nutrition: Goal: Adequate nutrition will be maintained Outcome: Progressing   Problem: Coping: Goal: Level of anxiety will decrease Outcome: Progressing   Problem: Pain Managment: Goal: General experience of comfort will improve Outcome: Progressing   

## 2023-02-27 NOTE — Progress Notes (Signed)
[facility] SURGICAL PROGRESS NOTE   Hospital Day(s): 379.   Post op day(s): 70 Days Post-Op.   Interval History:  Patient seen and examined Nothing new overnight No abdominal pain, nausea, emesis, fever No new labs [name] output unmeasured in last 24 hours; managing effluent independently No bleeding noted On cyclic TPN + soft diet  Ambulating well; independent  Review of Systems:  Constitutional: denies fever, chills  HEENT: denies cough or congestion  Respiratory: denies any shortness of breath  Cardiovascular: denies chest pain or palpitations  Gastrointestinal: denied abdominal pain, denied N/V Genitourinary: denies burning with urination or urinary frequency Integumentary: + midline wound (healing; stable)  Vital signs in last 24 hours: [min-max] current  Temp:  [98.4 F (36.9 C)] 98.4 F (36.9 C) (06/21 0353) Pulse Rate:  [85] 85 (06/21 0353) Resp:  [17] 17 (06/21 0353) BP: (106)/(64) 106/64 (06/21 0353) SpO2:  [92 %] 92 % (06/21 0353)     Height: 4\' 11"  (149.9 cm) Weight: 106.2 kg BMI (Calculated): 46.02   Intake/Output last 2 shifts:  06/20 0701 - 06/21 0700 In: 640.2 [I.V.:640.2] Out: -    Physical Exam:  Constitutional: alert, cooperative and no distress  Respiratory: breathing non-labored at rest  Cardiovascular: regular rate and sinus rhythm  Gastrointestinal: Soft, non-tender, non-distended, no rebound/guarding. Integumentary: Midline wound healing by secondary intention, peritoneum closed; granulating and new skin growing over majority of wound now; there are three stomatized areas visible in the LUQ and LLQ portions of the wound. No active bleeding. Piece of ST mesh noted.  Chest: Port to the right chest; no further swelling or drainage; no evidence of infection   Labs:     Latest Ref Rng & Units 02/20/2023    9:15 AM 02/02/2023    5:00 AM 01/27/2023    7:44 AM  CBC  WBC 4.0 - 10.5 K/uL 2.2  2.3  2.4   Hemoglobin 12.0 - 15.0 g/dL  8.8  8.3  7.5   Hematocrit 36.0 - 46.0 % 27.7  26.3  24.0   Platelets 150 - 400 K/uL 70  76  73       Latest Ref Rng & Units 02/20/2023    9:15 AM 02/16/2023    7:43 AM 02/09/2023    5:26 AM  CMP  Glucose 70 - 99 mg/dL 161  096  045   BUN 6 - 20 mg/dL 23  22  21    Creatinine 0.44 - 1.00 mg/dL 4.09  8.11  9.14   Sodium 135 - 145 mmol/L 137  134  131   Potassium 3.5 - 5.1 mmol/L 4.2  4.2  4.1   Chloride 98 - 111 mmol/L 106  104  102   CO2 22 - 32 mmol/L 26  25  25    Calcium 8.9 - 10.3 mg/dL 8.2  8.1  7.9   Total Protein 6.5 - 8.1 g/dL 5.7  5.8  5.6   Total Bilirubin 0.3 - 1.2 mg/dL 0.6  0.7  0.7   Alkaline Phos 38 - 126 U/L 147  153  148   AST 15 - 41 U/L 48  65  58   ALT 0 - 44 U/L 32  39  41     Imaging studies: No new imaging studies this AM   Assessment/Plan:  57 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  - New 06/21: No acute change   - Wound Care ([name] Pouch); Now on smaller [name] pouch; continues managing effluent independently. Intermittent oozing from time to time. Change as needed.    - Continue soft diet as tolerated  - Continue cyclic TPN; weekly nutritional labs - Appreciate dietary assistance - monitoring weight; now on daily Lasix as of 05/20             - Monitor abdominal examination; on-going bowel function            - Pain control prn; antemetic prn            - Progressed with therapies; no longer any recommendations; ambulating well    - Discharge Planning: Continue to anticipate lengthy admission +/- possible transfer; no new progress regarding disposition unfortunately   All of the above findings and recommendations were  discussed with the patient, and the medical team, and all of patient's questions were answered to her expressed satisfaction.  -- [name], PA-C [facility] 02/27/2023, 7:20 AM M-F: 7am - 4pm

## 2023-02-27 NOTE — Progress Notes (Signed)
Nutrition Brief Follow Up Note   Selenium- 74(L)- 6/19 Zinc- 43(L)- 6/19 Ferritin- 52- 6/19 Vitamin A- pending  INTERVENTION:   Continue cyclic TPN per pharmacy (16 hrs)- provides 1811kcal/day and 102g/day protein   Ensure Enlive po BID, each supplement provides 350 kcal and 20 grams of protein.  Continue MVI, chromium and trace elements in TPN  Daily weights   Increase zinc to additional 10mg  daily added to TPN   Increase selenium to additional daily added to TPN   Vitamin A 10,000 units po daily   Will repeat iron infusions as needed  Will check routine vitamin labs every 3 months, next due 7/11  Estimated Nutritional Needs:   Kcal:  1800-2100kcal/day  Protein:  90-110g/d  Fluid:  1.4-1.6L/day  Betsey Holiday MS, RD, LDN Please refer to [facility] for RD and/or RD on-call/weekend/after hours pager

## 2023-02-28 MED ORDER — ZINC CHLORIDE 1 MG/ML IV SOLN
INTRAVENOUS | Status: AC
  Filled 2023-02-28: qty 653.33

## 2023-02-28 NOTE — Progress Notes (Signed)
PHARMACY - TOTAL PARENTERAL NUTRITION CONSULT NOTE   Indication: Prolonged ileus  Patient Measurements: Height: 4\' 11"  (149.9 cm) Weight: 107.8 kg (237 lb 10.5 oz) IBW/kg (Calculated) : 43.2 TPN AdjBW (KG): 57.8 Body mass index is 48 kg/m.  Assessment: [name] is a 57 y.o. female s/p laparotomy, excision of greater omental mass, abdominal wall reconstruction with [name], appendectomy, and placement of Prevena vac.  Glucose / Insulin:  --No apparent history of diabetes --mSSI 4x/day (0500, 1300, 1800, 2200) --BG last 24h: 122 - 165 --SSI last 24h: 10 units  Electrolytes:electrolytes wnl Renal: SCr stable at baseline Hepatic: Mild transaminitis GI Imaging: 9/11 CTAP: no new acute issues GI Surgeries / Procedures: s/p laparotomy, excision of greater omental mass, abdominal wall reconstruction with [name], appendectomy, and placement of Prevena vac 12/19/22 placement of right IJ port-a-catheter with dual reservoirs also s/p re-opening of laparotomy for repair of small bowel perforation   Central access: 02/15/22 TPN start date: 02/15/22  Nutritional Goals: Goal cyclic TPN over 16 hrs: cyclic TPN over 16 hrs: (provides 102 g of protein and 1811 kcals per day) total volume over 16hrs for calculations=1400 ml (1500 ml total with overfill)  RD Assessment:  Estimated Needs Total Energy Estimated Needs: 1800-2100kcal/day Total Protein Estimated Needs: 90-110g/d Total Fluid Estimated Needs: 1.4-1.6L/day  Current Nutrition:  Soft diet + nutritional supplements, not meeting PO needs  Plan:  Transitioned to *Cyclic* TPN on 1/61/09.   to run over 16 hours per MD request. [name] at 2000 per discussion with dietician to allow patient time for walking in afternoon/evenings.  To run over 16 hours: -Start rate at 49 mL/hr for 1 hour. -Increase rate to 98 mL/hr for 13 hours.  -Decrease rate to 49 mL/hr for 1 hour. -Decrease rate to 25 mL/hr for 1  hour, then stop.  Plan:  See cyclic TPN rate above Nutritional Components Amino acids (using 15% Clinisol): 98 g Dextrose 19% = 266 g Lipids (using 20% SMOFlipids): 50.4 g kCal: 1817/24h  Electrolytes in TPN: Na 75 mEq/L, K 50 mEq/L, Ca 24mEq/L, Mg 10 mEq/L, Phos 10 mmol/L, Cl:Ac 1:1  trace elements, MVI, chromium, zinc (increased to 17g on 6/22) and selenium (increased to 150mg  on 6/22) per dietary recs  Continue CBG/SSI for Cyclic TPN:  -CBG 2 hrs after cyclic TPN start -CBG during middle of cyclic TPN -CBG 1 hr after cylic TPN stopped -CBG while off TPN Continue SSI insulin at moderate scale and 5u insulin in TPN Check TPN labs weekly on Mondays and more frequently if indicated  [name] 02/28/2023

## 2023-02-28 NOTE — Progress Notes (Signed)
[facility] SURGICAL PROGRESS NOTE   Hospital Day(s): 380.   Post op day(s): 71 Days Post-Op.   Interval History:  No acute events overnight.  Denies any abdominal pain, nausea, emesis, fever [name] output unmeasured in last 24 hours; managing effluent independently No bleeding noted On cyclic TPN + soft diet  Ambulating well; independent  Review of Systems:  Constitutional: denies fever, chills  HEENT: denies cough or congestion  Respiratory: denies any shortness of breath  Cardiovascular: denies chest pain or palpitations  Gastrointestinal: denied abdominal pain, denied N/V Genitourinary: denies burning with urination or urinary frequency Integumentary: + midline wound (healing; stable)  Vital signs in last 24 hours: [min-max] current  Temp:  [97.7 F (36.5 C)-98.9 F (37.2 C)] 98.2 F (36.8 C) (06/22 0825) Pulse Rate:  [73-79] 73 (06/22 0825) Resp:  [16-18] 16 (06/22 0825) BP: (107-115)/(55-79) 115/58 (06/22 0825) SpO2:  [96 %-100 %] 96 % (06/22 0825) Weight:  [107.8 kg] 107.8 kg (06/22 0500)     Height: 4\' 11"  (149.9 cm) Weight: 107.8 kg BMI (Calculated): 46.02   Intake/Output last 2 shifts:  06/21 0701 - 06/22 0700 In: 693.5 [I.V.:693.5] Out: -    Physical Exam:  Constitutional: alert, cooperative and no distress  Respiratory: breathing non-labored at rest  Cardiovascular: regular rate and sinus rhythm  Gastrointestinal: Soft, non-tender, non-distended, no rebound/guarding. Integumentary: Midline wound healing by secondary intention, peritoneum closed; granulating and new skin growing over majority of wound now; there are three stomatized areas visible in the LUQ and LLQ portions of the wound. No active bleeding. Piece of ST mesh noted.  Chest: Port to the right chest; no further swelling or drainage; no evidence of infection   Labs:     Latest Ref Rng & Units 02/20/2023    9:15 AM 02/02/2023    5:00 AM 01/27/2023    7:44 AM  CBC  WBC 4.0 -  10.5 K/uL 2.2  2.3  2.4   Hemoglobin 12.0 - 15.0 g/dL 8.8  8.3  7.5   Hematocrit 36.0 - 46.0 % 27.7  26.3  24.0   Platelets 150 - 400 K/uL 70  76  73       Latest Ref Rng & Units 02/20/2023    9:15 AM 02/16/2023    7:43 AM 02/09/2023    5:26 AM  CMP  Glucose 70 - 99 mg/dL 409  811  914   BUN 6 - 20 mg/dL 23  22  21    Creatinine 0.44 - 1.00 mg/dL 7.82  9.56  2.13   Sodium 135 - 145 mmol/L 137  134  131   Potassium 3.5 - 5.1 mmol/L 4.2  4.2  4.1   Chloride 98 - 111 mmol/L 106  104  102   CO2 22 - 32 mmol/L 26  25  25    Calcium 8.9 - 10.3 mg/dL 8.2  8.1  7.9   Total Protein 6.5 - 8.1 g/dL 5.7  5.8  5.6   Total Bilirubin 0.3 - 1.2 mg/dL 0.6  0.7  0.7   Alkaline Phos 38 - 126 U/L 147  153  148   AST 15 - 41 U/L 48  65  58   ALT 0 - 44 U/L 32  39  41     Imaging studies: No new imaging studies this AM   Assessment/Plan:  57 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  - New 06/22: No acute change   - Wound Care ([name] Pouch); Now on smaller [name] pouch; continues managing effluent independently. Intermittent oozing from time to time. Change as needed.    - Continue soft diet as tolerated  - Continue cyclic TPN; weekly nutritional labs - Appreciate dietary assistance - monitoring weight; now on daily Lasix as of 05/20             - Monitor abdominal examination; on-going bowel function            - Pain control prn; antemetic prn            - Progressed with therapies; no longer any recommendations; ambulating well    - Discharge Planning: Continue to anticipate lengthy admission +/- possible transfer; no new progress regarding disposition unfortunately   I  spent 35 minutes dedicated to the care of this patient on the date of this encounter to include pre-visit review of records, face-to-face time with the patient discussing diagnosis and management, and any post-visit coordination of care.  Howie Ill, MD [facility]

## 2023-03-01 LAB — GLUCOSE, CAPILLARY
Glucose-Capillary: 145 mg/dL — ABNORMAL HIGH (ref 70–99)
Glucose-Capillary: 130 mg/dL — ABNORMAL HIGH (ref 70–99)

## 2023-03-01 MED ORDER — ZINC CHLORIDE 1 MG/ML IV SOLN
INTRAVENOUS | Status: AC
  Filled 2023-03-01: qty 653.33

## 2023-03-01 NOTE — Progress Notes (Signed)
[facility] SURGICAL PROGRESS NOTE   Hospital Day(s): 381.   Post op day(s): 72 Days Post-Op.   Interval History:  No acute events overnight.  Denies any abdominal pain but reports having an episode of emesis this morning.  She feels she drank a lot of water.   [name] output unmeasured in last 24 hours but reports normal volume of output; managing effluent independently No bleeding noted On cyclic TPN + soft diet  Ambulating well; independent  Review of Systems:  Constitutional: denies fever, chills  HEENT: denies cough or congestion  Respiratory: denies any shortness of breath  Cardiovascular: denies chest pain or palpitations  Gastrointestinal: denied abdominal pain, denied N/V Genitourinary: denies burning with urination or urinary frequency Integumentary: + midline wound (healing; stable)  Vital signs in last 24 hours: [min-max] current  Temp:  [98.1 F (36.7 C)-98.4 F (36.9 C)] 98.3 F (36.8 C) (06/23 0758) Pulse Rate:  [77-87] 87 (06/23 0758) Resp:  [16-18] 18 (06/23 0758) BP: (103-118)/(50-70) 103/67 (06/23 0758) SpO2:  [93 %-100 %] 93 % (06/23 0758) Weight:  [107.3 kg] 107.3 kg (06/23 0619)     Height: 4\' 11"  (149.9 cm) Weight: 107.3 kg BMI (Calculated): 46.02   Intake/Output last 2 shifts:  No intake/output data recorded.   Physical Exam:  Constitutional: alert, cooperative and no distress  Respiratory: breathing non-labored at rest  Cardiovascular: regular rate and sinus rhythm  Gastrointestinal: Soft, non-tender, non-distended, no rebound/guarding. Integumentary: Midline wound healing by secondary intention, peritoneum closed; granulating and new skin growing over majority of wound now; there are three stomatized areas visible in the LUQ and LLQ portions of the wound. No active bleeding. Piece of ST mesh noted.  Chest: Port to the right chest; no further swelling or drainage; no evidence of infection   Labs:     Latest Ref Rng & Units  02/20/2023    9:15 AM 02/02/2023    5:00 AM 01/27/2023    7:44 AM  CBC  WBC 4.0 - 10.5 K/uL 2.2  2.3  2.4   Hemoglobin 12.0 - 15.0 g/dL 8.8  8.3  7.5   Hematocrit 36.0 - 46.0 % 27.7  26.3  24.0   Platelets 150 - 400 K/uL 70  76  73       Latest Ref Rng & Units 02/20/2023    9:15 AM 02/16/2023    7:43 AM 02/09/2023    5:26 AM  CMP  Glucose 70 - 99 mg/dL 130  865  784   BUN 6 - 20 mg/dL 23  22  21    Creatinine 0.44 - 1.00 mg/dL 6.96  2.95  2.84   Sodium 135 - 145 mmol/L 137  134  131   Potassium 3.5 - 5.1 mmol/L 4.2  4.2  4.1   Chloride 98 - 111 mmol/L 106  104  102   CO2 22 - 32 mmol/L 26  25  25    Calcium 8.9 - 10.3 mg/dL 8.2  8.1  7.9   Total Protein 6.5 - 8.1 g/dL 5.7  5.8  5.6   Total Bilirubin 0.3 - 1.2 mg/dL 0.6  0.7  0.7   Alkaline Phos 38 - 126 U/L 147  153  148   AST 15 - 41 U/L 48  65  58   ALT 0 - 44 U/L 32  39  41     Imaging studies: No new imaging studies this AM   Assessment/Plan:  57 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  - New 06/23:  She's feeling better now after her emesis earlier this morning.  Denies further nausea.  For now continue soft diet, but asked the patient to take it easy and to let us know if any further issues.  - Wound Care ([name] Pouch); Now on smaller [name] pouch; continues managing effluent independently. Intermittent oozing from time to time. Change as needed.    - Continue soft diet as tolerated  - Continue cyclic TPN; weekly nutritional labs - Appreciate dietary assistance - monitoring weight; now on daily Lasix as of 05/20             - Monitor abdominal examination; on-going bowel function            -  Pain control prn; antemetic prn            - Progressed with therapies; no longer any recommendations; ambulating well    - Discharge Planning: Continue to anticipate lengthy admission +/- possible transfer; no new progress regarding disposition unfortunately   I spent 35 minutes dedicated to the care of this patient on the date of this encounter to include pre-visit review of records, face-to-face time with the patient discussing diagnosis and management, and any post-visit coordination of care.  Howie Ill, MD [facility]

## 2023-03-01 NOTE — Progress Notes (Signed)
PHARMACY - TOTAL PARENTERAL NUTRITION CONSULT NOTE   Indication: Prolonged ileus  Patient Measurements: Height: 4\' 11"  (149.9 cm) Weight: 107.3 kg (236 lb 8.9 oz) IBW/kg (Calculated) : 43.2 TPN AdjBW (KG): 57.8 Body mass index is 47.78 kg/m.  Assessment: Robbi Berline Semrad is a 57 y.o. female s/p laparotomy, excision of greater omental mass, abdominal wall reconstruction with [name], appendectomy, and placement of Prevena vac.  Glucose / Insulin:  --No apparent history of diabetes --mSSI 4x/day (0500, 1300, 1800, 2200) --BG last 24h: 122 - 165 --SSI last 24h: 9 units  Electrolytes:electrolytes wnl Renal: SCr stable at baseline Hepatic: Mild transaminitis GI Imaging: 9/11 CTAP: no new acute issues GI Surgeries / Procedures: s/p laparotomy, excision of greater omental mass, abdominal wall reconstruction with [name], appendectomy, and placement of Prevena vac 12/19/22 placement of right IJ port-a-catheter with dual reservoirs also s/p re-opening of laparotomy for repair of small bowel perforation   Central access: 02/15/22 TPN start date: 02/15/22  Nutritional Goals: Goal cyclic TPN over 16 hrs: cyclic TPN over 16 hrs: (provides 102 g of protein and 1811 kcals per day) total volume over 16hrs for calculations=1400 ml (1500 ml total with overfill)  RD Assessment:  Estimated Needs Total Energy Estimated Needs: 1800-2100kcal/day Total Protein Estimated Needs: 90-110g/d Total Fluid Estimated Needs: 1.4-1.6L/day  Current Nutrition:  Soft diet + nutritional supplements, not meeting PO needs  Plan:  Transitioned to *Cyclic* TPN on 9/52/84.   to run over 16 hours per MD request. [name] at 2000 per discussion with dietician to allow patient time for walking in afternoon/evenings.  To run over 16 hours: -Start rate at 49 mL/hr for 1 hour. -Increase rate to 98 mL/hr for 13 hours.  -Decrease rate to 49 mL/hr for 1 hour. -Decrease rate to 25 mL/hr for 1  hour, then stop.  Plan:  See cyclic TPN rate above Nutritional Components Amino acids (using 15% Clinisol): 98 g Dextrose 19% = 266 g Lipids (using 20% SMOFlipids): 50.4 g kCal: 1817/24h  Electrolytes in TPN: Na 75 mEq/L, K 50 mEq/L, Ca 4mEq/L, Mg 10 mEq/L, Phos 10 mmol/L, Cl:Ac 1:1  trace elements, MVI, chromium, zinc (increased to 17g on 6/22) and selenium (increased to 150mg  on 6/22) per dietary recs  Continue CBG/SSI for Cyclic TPN:  -CBG 2 hrs after cyclic TPN start -CBG during middle of cyclic TPN -CBG 1 hr after cylic TPN stopped -CBG while off TPN Continue SSI insulin at moderate scale and 5u insulin in TPN Check TPN labs weekly on Mondays and more frequently if indicated  Ronnald Ramp 03/01/2023

## 2023-03-02 LAB — GLUCOSE, CAPILLARY
Glucose-Capillary: 164 mg/dL — ABNORMAL HIGH (ref 70–99)
Glucose-Capillary: 135 mg/dL — ABNORMAL HIGH (ref 70–99)
Glucose-Capillary: 166 mg/dL — ABNORMAL HIGH (ref 70–99)
Glucose-Capillary: 135 mg/dL — ABNORMAL HIGH (ref 70–99)
Glucose-Capillary: 187 mg/dL — ABNORMAL HIGH (ref 70–99)
Glucose-Capillary: 194 mg/dL — ABNORMAL HIGH (ref 70–99)
Glucose-Capillary: 194 mg/dL — ABNORMAL HIGH (ref 70–99)
Glucose-Capillary: 210 mg/dL — ABNORMAL HIGH (ref 70–99)
Glucose-Capillary: 135 mg/dL — ABNORMAL HIGH (ref 70–99)
Glucose-Capillary: 91 mg/dL (ref 70–99)
Glucose-Capillary: 197 mg/dL — ABNORMAL HIGH (ref 70–99)

## 2023-03-02 LAB — TRIGLYCERIDES: Triglycerides: 135 mg/dL (ref <150)

## 2023-03-02 LAB — COMPREHENSIVE METABOLIC PANEL
ALT: 35 U/L (ref 0–44)
AST: 50 U/L — ABNORMAL HIGH (ref 15–41)
Albumin: 2.5 g/dL — ABNORMAL LOW (ref 3.5–5.0)
Alkaline Phosphatase: 147 U/L — ABNORMAL HIGH (ref 38–126)
Anion gap: 5 (ref 5–15)
BUN: 24 mg/dL — ABNORMAL HIGH (ref 6–20)
CO2: 27 mmol/L (ref 22–32)
Calcium: 8.3 mg/dL — ABNORMAL LOW (ref 8.9–10.3)
Chloride: 107 mmol/L (ref 98–111)
Creatinine, Ser: 0.51 mg/dL (ref 0.44–1.00)
GFR, Estimated: >60 mL/min (ref >60)
Glucose, Bld: 197 mg/dL — ABNORMAL HIGH (ref 70–99)
Potassium: 4 mmol/L (ref 3.5–5.1)
Sodium: 139 mmol/L (ref 135–145)
Total Bilirubin: 0.5 mg/dL (ref 0.3–1.2)
Total Protein: 6 g/dL — ABNORMAL LOW (ref 6.5–8.1)

## 2023-03-02 LAB — MAGNESIUM: Magnesium: 2.2 mg/dL (ref 1.7–2.4)

## 2023-03-02 LAB — PHOSPHORUS: Phosphorus: 2.9 mg/dL (ref 2.5–4.6)

## 2023-03-02 MED ORDER — ZINC CHLORIDE 1 MG/ML IV SOLN
INTRAVENOUS | Status: AC
  Filled 2023-03-02: qty 653.33

## 2023-03-02 NOTE — Progress Notes (Signed)
[facility] SURGICAL PROGRESS NOTE   Hospital Day(s): 382.   Post op day(s): 73 Days Post-Op.   Interval History:  Patient seen and examined Episode of emesis over the weekend; now resolved Nothing new overnight Sleeping this morning  No abdominal pain, nausea, emesis, fever Nutritional labs this morning are reassuring [name] output unmeasured in last 24 hours; managing effluent independently No bleeding noted On cyclic TPN + soft diet  Ambulating well; independent  Review of Systems:  Constitutional: denies fever, chills  HEENT: denies cough or congestion  Respiratory: denies any shortness of breath  Cardiovascular: denies chest pain or palpitations  Gastrointestinal: denied abdominal pain, denied N/V Genitourinary: denies burning with urination or urinary frequency Integumentary: + midline wound (healing; stable)  Vital signs in last 24 hours: [min-max] current  Temp:  [97.7 F (36.5 C)-98.3 F (36.8 C)] 97.9 F (36.6 C) (06/24 0451) Pulse Rate:  [82-87] 82 (06/24 0451) Resp:  [18] 18 (06/24 0451) BP: (103-122)/(58-75) 122/58 (06/24 0451) SpO2:  [93 %-96 %] 94 % (06/24 0451) Weight:  [161 kg] 107 kg (06/24 0451)     Height: 4\' 11"  (149.9 cm) Weight: 107 kg BMI (Calculated): 46.02   Intake/Output last 2 shifts:  06/23 0701 - 06/24 0700 In: 120 [P.O.:120] Out: -    Physical Exam:  Constitutional: alert, cooperative and no distress  Respiratory: breathing non-labored at rest  Cardiovascular: regular rate and sinus rhythm  Gastrointestinal: Soft, non-tender, non-distended, no rebound/guarding. Integumentary: Midline wound healing by secondary intention, peritoneum closed; granulating and new skin growing over majority of wound now; there are three stomatized areas visible in the LUQ and LLQ portions of the wound. No active bleeding. Piece of ST mesh noted.  Chest: Port to the right chest; no further swelling or drainage; no evidence of  infection   Labs:     Latest Ref Rng & Units 02/20/2023    9:15 AM 02/02/2023    5:00 AM 01/27/2023    7:44 AM  CBC  WBC 4.0 - 10.5 K/uL 2.2  2.3  2.4   Hemoglobin 12.0 - 15.0 g/dL 8.8  8.3  7.5   Hematocrit 36.0 - 46.0 % 27.7  26.3  24.0   Platelets 150 - 400 K/uL 70  76  73       Latest Ref Rng & Units 02/20/2023    9:15 AM 02/16/2023    7:43 AM 02/09/2023    5:26 AM  CMP  Glucose 70 - 99 mg/dL 096  045  409   BUN 6 - 20 mg/dL 23  22  21    Creatinine 0.44 - 1.00 mg/dL 8.11  9.14  7.82   Sodium 135 - 145 mmol/L 137  134  131   Potassium 3.5 - 5.1 mmol/L 4.2  4.2  4.1   Chloride 98 - 111 mmol/L 106  104  102   CO2 22 - 32 mmol/L 26  25  25    Calcium 8.9 - 10.3 mg/dL 8.2  8.1  7.9   Total Protein 6.5 - 8.1 g/dL 5.7  5.8  5.6   Total Bilirubin 0.3 - 1.2 mg/dL 0.6  0.7  0.7   Alkaline Phos 38 - 126 U/L 147  153  148   AST 15 - 41 U/L 48  65  58   ALT 0 - 44 U/L 32  39  41     Imaging studies: No new imaging studies this AM   Assessment/Plan:  57 y.o. female with high  output enterocutaneous fistula 73 Days Post-Op placement of right IJ port-a-catheter with dual reservoirs also s/p re-opening of laparotomy for repair of small bowel perforation following initial laparotomy, excision of greater omental mass, abdominal wall reconstruction with [name], appendectomy, and placement of Prevena vac on 06/08.  - New 06/24: Doing well, no further emesis since isolated episode this weekend. Weekly labs reassuring. Nothing new otherwise. No acute change to long term plans; will update when available   - Wound Care ([name] Pouch); Now on smaller [name] pouch; continues managing effluent independently. Intermittent oozing from time to time. Change as needed.    - Continue soft diet as tolerated  - Continue cyclic TPN; weekly nutritional labs - Appreciate dietary assistance - monitoring weight; now on daily Lasix as of 05/20             - Monitor abdominal examination; on-going  bowel function            - Pain control prn; antemetic prn            - Progressed with therapies; no longer any recommendations; ambulating well    - Discharge Planning: Continue to anticipate lengthy admission +/- possible transfer; no new progress regarding disposition unfortunately   All of the above findings and recommendations were discussed with the patient, and the medical team, and all of patient's questions were answered to her expressed satisfaction.  -- [name], PA-C [facility] 03/02/2023, 7:30 AM M-F: 7am - 4pm

## 2023-03-02 NOTE — Progress Notes (Signed)
PHARMACY - TOTAL PARENTERAL NUTRITION CONSULT NOTE   Indication: Prolonged ileus  Patient Measurements: Height: 4\' 11"  (149.9 cm) Weight: 107 kg (235 lb 14.3 oz) IBW/kg (Calculated) : 43.2 TPN AdjBW (KG): 57.8 Body mass index is 47.64 kg/m.  Assessment: Debra [name] is a 57 y.o. female s/p laparotomy, excision of greater omental mass, abdominal wall reconstruction with [name], appendectomy, and placement of Prevena vac.  Glucose / Insulin:  --No apparent history of diabetes --mSSI 4x/day (0500, 1300, 1800, 2200) --BG last 24h: 130-197 --SSI last 24h: 10 units  Electrolytes:electrolytes wnl Renal: SCr stable at baseline Hepatic: Mild transaminitis GI Imaging: 9/11 CTAP: no new acute issues GI Surgeries / Procedures: s/p laparotomy, excision of greater omental mass, abdominal wall reconstruction with [name], appendectomy, and placement of Prevena vac 12/19/22 placement of right IJ port-a-catheter with dual reservoirs also s/p re-opening of laparotomy for repair of small bowel perforation   Central access: 02/15/22 TPN start date: 02/15/22  Nutritional Goals: Goal cyclic TPN over 16 hrs: cyclic TPN over 16 hrs: (provides 102 g of protein and 1811 kcals per day) total volume over 16hrs for calculations=1400 ml (1500 ml total with overfill)  RD Assessment:  Estimated Needs Total Energy Estimated Needs: 1800-2100kcal/day Total Protein Estimated Needs: 90-110g/d Total Fluid Estimated Needs: 1.4-1.6L/day  Current Nutrition:  Soft diet + nutritional supplements, not meeting PO needs  Plan:  Transitioned to *Cyclic* TPN on 1/61/09.   to run over 16 hours per MD request. [name] at 2000 per discussion with dietician to allow patient time for walking in afternoon/evenings.  To run over 16 hours: -Start rate at 49 mL/hr for 1 hour. -Increase rate to 98 mL/hr for 13 hours.  -Decrease rate to 49 mL/hr for 1 hour. -Decrease rate to 25 mL/hr for 1  hour, then stop.  Plan:  See cyclic TPN rate above Nutritional Components Amino acids (using 15% Clinisol): 98 g Dextrose 19% = 266 g Lipids (using 20% SMOFlipids): 50.4 g kCal: 1817/24h  Electrolytes in TPN: Na 75 mEq/L, K 50 mEq/L, Ca 72mEq/L, Mg 10 mEq/L, Phos 10 mmol/L, Cl:Ac 1:1  trace elements, MVI, chromium, zinc (increased to 17g on 6/22) and selenium (increased to 150mg  on 6/22) per dietary recs  Continue CBG/SSI for Cyclic TPN:  -CBG 2 hrs after cyclic TPN start -CBG during middle of cyclic TPN -CBG 1 hr after cylic TPN stopped -CBG while off TPN Continue SSI insulin at moderate scale and 7u insulin in TPN Check TPN labs weekly on Mondays and more frequently if indicated  Sharen Hones, PharmD, BCPS Clinical Pharmacist   03/02/2023

## 2023-03-03 LAB — GLUCOSE, CAPILLARY
Glucose-Capillary: 209 mg/dL — ABNORMAL HIGH (ref 70–99)
Glucose-Capillary: 182 mg/dL — ABNORMAL HIGH (ref 70–99)
Glucose-Capillary: 180 mg/dL — ABNORMAL HIGH (ref 70–99)
Glucose-Capillary: 105 mg/dL — ABNORMAL HIGH (ref 70–99)
Glucose-Capillary: 75 mg/dL (ref 70–99)

## 2023-03-03 MED ORDER — ZINC CHLORIDE 1 MG/ML IV SOLN
INTRAVENOUS | Status: AC
  Filled 2023-03-03: qty 653.33

## 2023-03-03 NOTE — Progress Notes (Signed)
I have reviewed and concur with this student's documentation. CI was present during med pass.  Leodis Sias, RN 03/03/2023 4:09 PM

## 2023-03-03 NOTE — Progress Notes (Signed)
[facility] SURGICAL PROGRESS NOTE   Hospital Day(s): 383.   Post op day(s): 74 Days Post-Op.   Interval History:  Patient seen and examined Nothing new overnight No issues this morning  No abdominal pain, nausea, emesis, fever No new labs  [name] output unmeasured in last 24 hours; managing effluent independently No bleeding noted On cyclic TPN + soft diet  Ambulating well; independent  Review of Systems:  Constitutional: denies fever, chills  HEENT: denies cough or congestion  Respiratory: denies any shortness of breath  Cardiovascular: denies chest pain or palpitations  Gastrointestinal: denied abdominal pain, denied N/V Genitourinary: denies burning with urination or urinary frequency Integumentary: + midline wound (healing; stable)  Vital signs in last 24 hours: [min-max] current  Temp:  [98 F (36.7 C)-98.7 F (37.1 C)] 98 F (36.7 C) (06/24 2041) Pulse Rate:  [76-78] 77 (06/24 2041) Resp:  [18] 18 (06/24 2041) BP: (103-131)/(60-69) 131/69 (06/24 2041) SpO2:  [94 %-98 %] 94 % (06/24 2041)     Height: 4\' 11"  (149.9 cm) Weight: 107 kg BMI (Calculated): 46.02   Intake/Output last 2 shifts:  06/24 0701 - 06/25 0700 In: 240 [P.O.:240] Out: -    Physical Exam:  Constitutional: alert, cooperative and no distress  Respiratory: breathing non-labored at rest  Cardiovascular: regular rate and sinus rhythm  Gastrointestinal: Soft, non-tender, non-distended, no rebound/guarding. Integumentary: Midline wound healing by secondary intention, peritoneum closed; granulating and new skin growing over majority of wound now; there are three stomatized areas visible in the LUQ and LLQ portions of the wound. No active bleeding. Piece of ST mesh noted.  Chest: Port to the right chest; no further swelling or drainage; no evidence of infection   Labs:     Latest Ref Rng & Units 02/20/2023    9:15 AM 02/02/2023    5:00 AM 01/27/2023    7:44 AM  CBC  WBC 4.0 - 10.5  K/uL 2.2  2.3  2.4   Hemoglobin 12.0 - 15.0 g/dL 8.8  8.3  7.5   Hematocrit 36.0 - 46.0 % 27.7  26.3  24.0   Platelets 150 - 400 K/uL 70  76  73       Latest Ref Rng & Units 03/02/2023    7:17 AM 02/20/2023    9:15 AM 02/16/2023    7:43 AM  CMP  Glucose 70 - 99 mg/dL 147  829  562   BUN 6 - 20 mg/dL 24  23  22    Creatinine 0.44 - 1.00 mg/dL 1.30  8.65  7.84   Sodium 135 - 145 mmol/L 139  137  134   Potassium 3.5 - 5.1 mmol/L 4.0  4.2  4.2   Chloride 98 - 111 mmol/L 107  106  104   CO2 22 - 32 mmol/L 27  26  25    Calcium 8.9 - 10.3 mg/dL 8.3  8.2  8.1   Total Protein 6.5 - 8.1 g/dL 6.0  5.7  5.8   Total Bilirubin 0.3 - 1.2 mg/dL 0.5  0.6  0.7   Alkaline Phos 38 - 126 U/L 147  147  153   AST 15 - 41 U/L 50  48  65   ALT 0 - 44 U/L 35  32  39     Imaging studies: No new imaging studies this AM   Assessment/Plan:  57 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  - New 06/25: No acute change  - Wound Care ([name] Pouch); Now on smaller [name] pouch; continues managing effluent independently. Intermittent oozing from time to time. Change as needed.    - Continue soft diet as tolerated  - Continue cyclic TPN; weekly nutritional labs - Appreciate dietary assistance - monitoring weight; now on daily Lasix as of 05/20             - Monitor abdominal examination; on-going bowel function            - Pain control prn; antemetic prn            - Progressed with therapies; no longer any recommendations; ambulating well    - Discharge Planning: Continue to anticipate lengthy admission +/- possible transfer; no new progress regarding disposition unfortunately   All of  the above findings and recommendations were discussed with the patient, and the medical team, and all of patient's questions were answered to her expressed satisfaction.  -- [name], PA-C [facility] 03/03/2023, 7:18 AM M-F: 7am - 4pm

## 2023-03-03 NOTE — Progress Notes (Signed)
Nutrition Follow-up  DOCUMENTATION CODES:   Obesity unspecified  INTERVENTION:   Continue cyclic TPN per pharmacy (16 hrs)- provides 1811kcal/day and 102g/day protein    Ensure Enlive po BID, each supplement provides 350 kcal and 20 grams of protein.   Continue MVI, chromium and trace elements in TPN   Daily weights    10 mg zinc daily added to TPN    150 mcg selenium daily added to TPN    Vitamin A 10,000 units po daily    Will repeat iron infusions as needed   Will check routine vitamin labs every 3 months, next due 7/11  NUTRITION DIAGNOSIS:   Increased nutrient needs related to wound healing, catabolic illness as evidenced by estimated needs.  Ongoing  GOAL:   Patient will meet greater than or equal to 90% of their needs  Met with TPN  MONITOR:   PO intake, Supplement acceptance, Labs, Weight trends, Diet advancement, I & O's  REASON FOR ASSESSMENT:   Consult New TPN/TNA  ASSESSMENT:   57 y/o female with h/o hypothyroidism, COVID 19 (3/21), kidney stones and stage 3 colon cancer (s/p left hemicolectomy 5/21 and chemotherapy) who is admitted for new pelvic mass now s/p laparotomy 6/8 (with excision of pelvic mass from greater omentum, abdominal wall reconstruction with bilateral myocutaneous flaps and mesh, incisional hernia repair, appendectomy repair and VAC placement) complicated by bowel perforation s/p reopening of recent laparotomy 6/10 (with repair of small bowel perforation, excision of mesh, placement of two phasix mesh and VAC placement).  Pt s/p right IJ port 4/12   Reviewed I/O's: +240 ml x 24 hours and +8 L since 02/17/23   Per general surgery notes, pt has transitioned to smaller eakin pouch, which she manages independently. Remains with intermittent oozing).   Pt on a soft diet with minimal oral intake. Noted meal completions 0-40%. Ensure has been ordered, but pt mainly refusing.   Pt remains on TPN for nutritional support (cyclic TPN for  16 hours). TPN provides 1811 kcals and 102 grams protein, which meets 100% of estimated needs nutritional needs.   Reviewed wt hx; wt has been stable over the past week.   Medications reviewed and include lasix and vitamin A.   Labs reviewed: Selenium- 74(L)- 6/19; Zinc- 43(L)- 6/19; Ferritin- 52- 6/19; Vitamin A: 14.3 (02/25/23). CBGS: 197-209 (inpatient orders for glycemic control are 0-15 units insulin aspart 4 times daily).    Diet Order:   Diet Order             DIET SOFT Fluid consistency: Thin  Diet effective now                   EDUCATION NEEDS:   Not appropriate for education at this time  Skin:  Skin Assessment: Reviewed RN Assessment (abdominal wound 11 x 12 cms) Skin Integrity Issues:: Incisions Incisions: closed abdomen  Last BM:  03/01/23 (type 7 via colostomy)  Height:   Ht Readings from Last 1 Encounters:  12/19/22 4\' 11"  (1.499 m)    Weight:   Wt Readings from Last 1 Encounters:  03/02/23 107 kg    Ideal Body Weight:  44.3 kg  BMI:  Body mass index is 47.64 kg/m.  Estimated Nutritional Needs:   Kcal:  1800-2100kcal/day  Protein:  90-110g/d  Fluid:  1.4-1.6L/day    Levada Schilling, RD, LDN, CDCES Registered Dietitian II Certified Diabetes Care and Education Specialist Please refer to [facility] for RD and/or RD on-call/weekend/after hours pager

## 2023-03-03 NOTE — Progress Notes (Signed)
PHARMACY - TOTAL PARENTERAL NUTRITION CONSULT NOTE   Indication: Prolonged ileus  Patient Measurements: Height: 4\' 11"  (149.9 cm) Weight: 107 kg (235 lb 14.3 oz) IBW/kg (Calculated) : 43.2 TPN AdjBW (KG): 57.8 Body mass index is 47.64 kg/m.  Assessment: Debra [name] is a 56 y.o. female s/p laparotomy, excision of greater omental mass, abdominal wall reconstruction with [name], appendectomy, and placement of Prevena vac.  Glucose / Insulin:  --No apparent history of diabetes --mSSI 4x/day (0500, 1300, 1800, 2200) --BG last 24h: 197-209 --SSI last 24h: 13 units  Electrolytes:electrolytes wnl Renal: SCr stable at baseline Hepatic: Mild transaminitis GI Imaging: 9/11 CTAP: no new acute issues GI Surgeries / Procedures: s/p laparotomy, excision of greater omental mass, abdominal wall reconstruction with [name], appendectomy, and placement of Prevena vac 12/19/22 placement of right IJ port-a-catheter with dual reservoirs also s/p re-opening of laparotomy for repair of small bowel perforation   Central access: 02/15/22 TPN start date: 02/15/22  Nutritional Goals: Goal cyclic TPN over 16 hrs: cyclic TPN over 16 hrs: (provides 102 g of protein and 1811 kcals per day) total volume over 16hrs for calculations=1400 ml (1500 ml total with overfill)  RD Assessment:  Estimated Needs Total Energy Estimated Needs: 1800-2100kcal/day Total Protein Estimated Needs: 90-110g/d Total Fluid Estimated Needs: 1.4-1.6L/day  Current Nutrition:  Soft diet + nutritional supplements, not meeting PO needs  Plan:  Transitioned to *Cyclic* TPN on 2/72/53.   to run over 16 hours per MD request. [name] at 2000 per discussion with dietician to allow patient time for walking in afternoon/evenings.  To run over 16 hours: -Start rate at 49 mL/hr for 1 hour. -Increase rate to 98 mL/hr for 13 hours.  -Decrease rate to 49 mL/hr for 1 hour. -Decrease rate to 25 mL/hr for 1  hour, then stop.  Plan:  See cyclic TPN rate above Nutritional Components Amino acids (using 15% Clinisol): 98 g Dextrose 19% = 266 g Lipids (using 20% SMOFlipids): 50.4 g kCal: 1817/24h  Electrolytes in TPN: Na 75 mEq/L, K 50 mEq/L, Ca 23mEq/L, Mg 10 mEq/L, Phos 10 mmol/L, Cl:Ac 1:1  trace elements, MVI, chromium, zinc (increased to 17g on 6/22) and selenium (increased to 150mg  on 6/22) per dietary recs  Continue CBG/SSI for Cyclic TPN:  -CBG 2 hrs after cyclic TPN start -CBG during middle of cyclic TPN -CBG 1 hr after cylic TPN stopped -CBG while off TPN Continue SSI insulin at moderate scale and 7u insulin in TPN Check TPN labs weekly on Mondays and more frequently if indicated  Sharen Hones, PharmD, BCPS Clinical Pharmacist   03/03/2023

## 2023-03-04 LAB — GLUCOSE, CAPILLARY
Glucose-Capillary: 217 mg/dL — ABNORMAL HIGH (ref 70–99)
Glucose-Capillary: 200 mg/dL — ABNORMAL HIGH (ref 70–99)
Glucose-Capillary: 206 mg/dL — ABNORMAL HIGH (ref 70–99)
Glucose-Capillary: 159 mg/dL — ABNORMAL HIGH (ref 70–99)
Glucose-Capillary: 142 mg/dL — ABNORMAL HIGH (ref 70–99)
Glucose-Capillary: 112 mg/dL — ABNORMAL HIGH (ref 70–99)

## 2023-03-04 MED ORDER — ZINC CHLORIDE 1 MG/ML IV SOLN
INTRAVENOUS | Status: AC
  Filled 2023-03-04: qty 653.33

## 2023-03-04 MED ORDER — INSULIN ASPART 100 UNIT/ML IJ SOLN
0.0000 [IU] | Freq: Four times a day (QID) | INTRAMUSCULAR | Status: DC
  Administered 2023-03-04: 3 [IU] via SUBCUTANEOUS
  Administered 2023-03-05: 5 [IU] via SUBCUTANEOUS
  Administered 2023-03-05 (×2): 3 [IU] via SUBCUTANEOUS
  Administered 2023-03-06: 2 [IU] via SUBCUTANEOUS
  Administered 2023-03-06 (×2): 3 [IU] via SUBCUTANEOUS
  Administered 2023-03-06 – 2023-03-07 (×2): 2 [IU] via SUBCUTANEOUS
  Administered 2023-03-07: 3 [IU] via SUBCUTANEOUS
  Administered 2023-03-07: 2 [IU] via SUBCUTANEOUS
  Administered 2023-03-07 – 2023-03-08 (×2): 3 [IU] via SUBCUTANEOUS
  Administered 2023-03-08 – 2023-03-09 (×4): 2 [IU] via SUBCUTANEOUS
  Administered 2023-03-09 – 2023-03-10 (×2): 3 [IU] via SUBCUTANEOUS
  Administered 2023-03-10: 2 [IU] via SUBCUTANEOUS
  Administered 2023-03-10 – 2023-03-11 (×2): 3 [IU] via SUBCUTANEOUS
  Administered 2023-03-11: 2 [IU] via SUBCUTANEOUS
  Administered 2023-03-11 – 2023-03-12 (×2): 3 [IU] via SUBCUTANEOUS
  Administered 2023-03-12: 2 [IU] via SUBCUTANEOUS
  Administered 2023-03-12 – 2023-03-14 (×5): 3 [IU] via SUBCUTANEOUS
  Administered 2023-03-15: 5 [IU] via SUBCUTANEOUS
  Administered 2023-03-15: 3 [IU] via SUBCUTANEOUS
  Administered 2023-03-15: 2 [IU] via SUBCUTANEOUS
  Administered 2023-03-15 – 2023-03-17 (×8): 3 [IU] via SUBCUTANEOUS
  Administered 2023-03-18: 2 [IU] via SUBCUTANEOUS
  Administered 2023-03-18: 3 [IU] via SUBCUTANEOUS
  Administered 2023-03-18: 2 [IU] via SUBCUTANEOUS
  Administered 2023-03-19: 5 [IU] via SUBCUTANEOUS
  Administered 2023-03-19: 2 [IU] via SUBCUTANEOUS
  Administered 2023-03-19: 5 [IU] via SUBCUTANEOUS
  Administered 2023-03-19: 3 [IU] via SUBCUTANEOUS
  Administered 2023-03-20: 5 [IU] via SUBCUTANEOUS
  Administered 2023-03-20: 3 [IU] via SUBCUTANEOUS
  Administered 2023-03-20 – 2023-03-21 (×4): 2 [IU] via SUBCUTANEOUS
  Administered 2023-03-22: 5 [IU] via SUBCUTANEOUS
  Administered 2023-03-22: 2 [IU] via SUBCUTANEOUS
  Administered 2023-03-22 – 2023-03-23 (×4): 3 [IU] via SUBCUTANEOUS
  Administered 2023-03-23: 2 [IU] via SUBCUTANEOUS
  Administered 2023-03-23: 5 [IU] via SUBCUTANEOUS
  Administered 2023-03-24: 2 [IU] via SUBCUTANEOUS
  Administered 2023-03-24 (×2): 3 [IU] via SUBCUTANEOUS
  Administered 2023-03-24: 2 [IU] via SUBCUTANEOUS
  Administered 2023-03-25 (×2): 3 [IU] via SUBCUTANEOUS
  Administered 2023-03-26: 5 [IU] via SUBCUTANEOUS
  Administered 2023-03-26: 3 [IU] via SUBCUTANEOUS
  Administered 2023-03-26: 2 [IU] via SUBCUTANEOUS
  Administered 2023-03-26: 3 [IU] via SUBCUTANEOUS
  Administered 2023-03-26 – 2023-03-27 (×2): 2 [IU] via SUBCUTANEOUS
  Administered 2023-03-27: 5 [IU] via SUBCUTANEOUS
  Administered 2023-03-27: 3 [IU] via SUBCUTANEOUS
  Administered 2023-03-27: 2 [IU] via SUBCUTANEOUS
  Administered 2023-03-28: 5 [IU] via SUBCUTANEOUS
  Administered 2023-03-28: 3 [IU] via SUBCUTANEOUS
  Administered 2023-03-28 – 2023-03-29 (×3): 2 [IU] via SUBCUTANEOUS
  Administered 2023-03-29: 5 [IU] via SUBCUTANEOUS
  Administered 2023-03-29: 3 [IU] via SUBCUTANEOUS
  Administered 2023-03-29: 2 [IU] via SUBCUTANEOUS
  Administered 2023-03-30: 3 [IU] via SUBCUTANEOUS
  Administered 2023-03-30: 5 [IU] via SUBCUTANEOUS
  Administered 2023-03-30: 2 [IU] via SUBCUTANEOUS
  Administered 2023-03-31: 5 [IU] via SUBCUTANEOUS
  Administered 2023-03-31: 3 [IU] via SUBCUTANEOUS
  Administered 2023-03-31: 5 [IU] via SUBCUTANEOUS
  Administered 2023-03-31 – 2023-04-01 (×3): 2 [IU] via SUBCUTANEOUS
  Administered 2023-04-01: 5 [IU] via SUBCUTANEOUS
  Administered 2023-04-02: 3 [IU] via SUBCUTANEOUS
  Administered 2023-04-02: 5 [IU] via SUBCUTANEOUS
  Administered 2023-04-02 – 2023-04-03 (×2): 2 [IU] via SUBCUTANEOUS
  Administered 2023-04-03: 3 [IU] via SUBCUTANEOUS
  Administered 2023-04-03: 2 [IU] via SUBCUTANEOUS
  Administered 2023-04-04: 5 [IU] via SUBCUTANEOUS
  Administered 2023-04-04: 2 [IU] via SUBCUTANEOUS
  Administered 2023-04-05: 5 [IU] via SUBCUTANEOUS
  Administered 2023-04-05: 3 [IU] via SUBCUTANEOUS
  Administered 2023-04-05 – 2023-04-07 (×6): 2 [IU] via SUBCUTANEOUS
  Administered 2023-04-08: 3 [IU] via SUBCUTANEOUS
  Administered 2023-04-08: 2 [IU] via SUBCUTANEOUS
  Administered 2023-04-09: 3 [IU] via SUBCUTANEOUS
  Administered 2023-04-09 – 2023-04-10 (×3): 2 [IU] via SUBCUTANEOUS
  Administered 2023-04-10: 3 [IU] via SUBCUTANEOUS
  Administered 2023-04-10 (×2): 2 [IU] via SUBCUTANEOUS
  Administered 2023-04-11: 3 [IU] via SUBCUTANEOUS
  Administered 2023-04-11: 2 [IU] via SUBCUTANEOUS
  Administered 2023-04-12 – 2023-04-13 (×3): 3 [IU] via SUBCUTANEOUS
  Administered 2023-04-13 (×2): 2 [IU] via SUBCUTANEOUS
  Administered 2023-04-13 – 2023-04-14 (×2): 3 [IU] via SUBCUTANEOUS
  Administered 2023-04-14: 2 [IU] via SUBCUTANEOUS
  Administered 2023-04-14 – 2023-04-18 (×6): 3 [IU] via SUBCUTANEOUS
  Administered 2023-04-18: 2 [IU] via SUBCUTANEOUS
  Administered 2023-04-18: 3 [IU] via SUBCUTANEOUS
  Administered 2023-04-19 (×2): 2 [IU] via SUBCUTANEOUS
  Administered 2023-04-20: 3 [IU] via SUBCUTANEOUS
  Administered 2023-04-20 (×2): 2 [IU] via SUBCUTANEOUS
  Administered 2023-04-21: 3 [IU] via SUBCUTANEOUS
  Administered 2023-04-21: 2 [IU] via SUBCUTANEOUS
  Administered 2023-04-22: 1 [IU] via SUBCUTANEOUS
  Administered 2023-04-22 – 2023-04-23 (×3): 3 [IU] via SUBCUTANEOUS
  Administered 2023-04-23 (×2): 2 [IU] via SUBCUTANEOUS
  Administered 2023-04-24: 3 [IU] via SUBCUTANEOUS
  Administered 2023-04-24: 2 [IU] via SUBCUTANEOUS
  Administered 2023-04-24: 3 [IU] via SUBCUTANEOUS
  Administered 2023-04-25 (×2): 2 [IU] via SUBCUTANEOUS
  Administered 2023-04-25 – 2023-04-26 (×2): 3 [IU] via SUBCUTANEOUS
  Administered 2023-04-26 (×2): 2 [IU] via SUBCUTANEOUS
  Administered 2023-04-27: 3 [IU] via SUBCUTANEOUS
  Administered 2023-04-27 – 2023-04-28 (×2): 2 [IU] via SUBCUTANEOUS
  Filled 2023-03-04 (×166): qty 1

## 2023-03-04 MED ORDER — INSULIN ASPART 100 UNIT/ML IJ SOLN: 0.0000 [IU] | Freq: Four times a day (QID) | INTRAMUSCULAR | Status: DC

## 2023-03-04 NOTE — Progress Notes (Signed)
[facility] SURGICAL PROGRESS NOTE   Hospital Day(s): 384.   Post op day(s): 75 Days Post-Op.   Interval History:  Patient seen and examined Nothing acute overnight BP softer side this AM; otherwise no new issues Sleepingl just up to restroom  No abdominal pain, nausea, emesis, fever No new labs  [name] output 50 ccs + unmeasured in last 24 hours; managing effluent independently No bleeding noted On cyclic TPN + soft diet  Ambulating well; independent  Review of Systems:  Constitutional: denies fever, chills  HEENT: denies cough or congestion  Respiratory: denies any shortness of breath  Cardiovascular: denies chest pain or palpitations  Gastrointestinal: denied abdominal pain, denied N/V Genitourinary: denies burning with urination or urinary frequency Integumentary: + midline wound (healing; stable)  Vital signs in last 24 hours: [min-max] current  Temp:  [97.6 F (36.4 C)-98.3 F (36.8 C)] 98.3 F (36.8 C) (06/26 0801) Pulse Rate:  [68-75] 75 (06/26 0801) Resp:  [16] 16 (06/26 0801) BP: (99-109)/(51-58) 109/58 (06/26 0801) SpO2:  [95 %-97 %] 96 % (06/26 0801)     Height: 4\' 11"  (149.9 cm) Weight: 107 kg BMI (Calculated): 46.02   Intake/Output last 2 shifts:  06/25 0701 - 06/26 0700 In: 600 [P.O.:600] Out: 50 [Stool:50]   Physical Exam:  Constitutional: alert, cooperative and no distress  Respiratory: breathing non-labored at rest  Cardiovascular: regular rate and sinus rhythm  Gastrointestinal: Soft, non-tender, non-distended, no rebound/guarding. Integumentary: Midline wound healing by secondary intention, peritoneum closed; granulating and new skin growing over majority of wound now; there are three stomatized areas visible in the LUQ and LLQ portions of the wound. No active bleeding. Piece of ST mesh noted.  Chest: Port to the right chest; no further swelling or drainage; no evidence of infection   Labs:     Latest Ref Rng & Units 02/20/2023     9:15 AM 02/02/2023    5:00 AM 01/27/2023    7:44 AM  CBC  WBC 4.0 - 10.5 K/uL 2.2  2.3  2.4   Hemoglobin 12.0 - 15.0 g/dL 8.8  8.3  7.5   Hematocrit 36.0 - 46.0 % 27.7  26.3  24.0   Platelets 150 - 400 K/uL 70  76  73       Latest Ref Rng & Units 03/02/2023    7:17 AM 02/20/2023    9:15 AM 02/16/2023    7:43 AM  CMP  Glucose 70 - 99 mg/dL 409  811  914   BUN 6 - 20 mg/dL 24  23  22    Creatinine 0.44 - 1.00 mg/dL 7.82  9.56  2.13   Sodium 135 - 145 mmol/L 139  137  134   Potassium 3.5 - 5.1 mmol/L 4.0  4.2  4.2   Chloride 98 - 111 mmol/L 107  106  104   CO2 22 - 32 mmol/L 27  26  25    Calcium 8.9 - 10.3 mg/dL 8.3  8.2  8.1   Total Protein 6.5 - 8.1 g/dL 6.0  5.7  5.8   Total Bilirubin 0.3 - 1.2 mg/dL 0.5  0.6  0.7   Alkaline Phos 38 - 126 U/L 147  147  153   AST 15 - 41 U/L 50  48  65   ALT 0 - 44 U/L 35  32  39     Imaging studies: No new imaging studies this AM   Assessment/Plan:  57 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  - New 06/26: No acute changes this morning   - Wound Care ([name] Pouch); Now on smaller [name] pouch; continues managing effluent independently. Intermittent oozing from time to time. Change as needed.    - Continue soft diet as tolerated  - Continue cyclic TPN; weekly nutritional labs - Appreciate dietary assistance - monitoring weight; now on daily Lasix as of 05/20             - Monitor abdominal examination; on-going bowel function            - Pain control prn; antemetic prn            - Progressed with therapies; no longer any recommendations; ambulating well    - Discharge Planning: Continue to anticipate lengthy  admission +/- possible transfer; no new progress regarding disposition unfortunately   All of the above findings and recommendations were discussed with the patient, and the medical team, and all of patient's questions were answered to her expressed satisfaction.  -- [name], PA-C [facility] 03/04/2023, 8:37 AM M-F: 7am - 4pm

## 2023-03-04 NOTE — Progress Notes (Signed)
PHARMACY - TOTAL PARENTERAL NUTRITION CONSULT NOTE   Indication: Prolonged ileus  Patient Measurements: Height: 4\' 11"  (149.9 cm) Weight: 107 kg (235 lb 14.3 oz) IBW/kg (Calculated) : 43.2 TPN AdjBW (KG): 57.8 Body mass index is 47.64 kg/m.  Assessment: Darleen Zilpha Mcandrew is a 57 y.o. female s/p laparotomy, excision of greater omental mass, abdominal wall reconstruction with [name], appendectomy, and placement of Prevena vac.  Glucose / Insulin:  --No apparent history of diabetes --mSSI 4x/day (0500, 1300, 1800, 2200) --BG last 24h: 197-209 > 75-217 --SSI last 24h: 13 units > 11 units Electrolytes:electrolytes wnl Renal: SCr stable at baseline Hepatic: Mild transaminitis GI Imaging: 9/11 CTAP: no new acute issues GI Surgeries / Procedures: s/p laparotomy, excision of greater omental mass, abdominal wall reconstruction with [name], appendectomy, and placement of Prevena vac 12/19/22 placement of right IJ port-a-catheter with dual reservoirs also s/p re-opening of laparotomy for repair of small bowel perforation   Central access: 02/15/22 TPN start date: 02/15/22  Nutritional Goals: Goal cyclic TPN over 16 hrs: cyclic TPN over 16 hrs: (provides 102 g of protein and 1811 kcals per day) total volume over 16hrs for calculations=1400 ml (1500 ml total with overfill)  RD Assessment:  Estimated Needs Total Energy Estimated Needs: 1800-2100kcal/day Total Protein Estimated Needs: 90-110g/d Total Fluid Estimated Needs: 1.4-1.6L/day  Current Nutrition:  Soft diet + nutritional supplements, not meeting PO needs  Plan:  Transitioned to *Cyclic* TPN on 12/16/79.   to run over 16 hours per MD request. [name] at 2000 per discussion with dietician to allow patient time for walking in afternoon/evenings.  To run over 16 hours: -Start rate at 49 mL/hr for 1 hour. -Increase rate to 98 mL/hr for 13 hours.  -Decrease rate to 49 mL/hr for 1 hour. -Decrease rate to  25 mL/hr for 1 hour, then stop.  Plan:  See cyclic TPN rate above Nutritional Components Amino acids (using 15% Clinisol): 98 g Dextrose 19% = 266 g Lipids (using 20% SMOFlipids): 50.4 g kCal: 1817/24h  Electrolytes in TPN: Na 75 mEq/L, K 50 mEq/L, Ca 69mEq/L, Mg 10 mEq/L, Phos 10 mmol/L, Cl:Ac 1:1  trace elements, MVI, chromium, zinc (increased to 17g on 6/22) and selenium (increased to 150mg  on 6/22) per dietary recs  Continue CBG/SSI for Cyclic TPN:  -CBG 2 hrs after cyclic TPN start -CBG during middle of cyclic TPN -CBG 1 hr after cylic TPN stopped -CBG while off TPN Continue SSI insulin at moderate scale and increase  7> 9u insulin in TPN Check TPN labs weekly on Mondays and more frequently if indicated  Sharen Hones, PharmD, BCPS Clinical Pharmacist   03/04/2023

## 2023-03-05 LAB — GLUCOSE, CAPILLARY
Glucose-Capillary: 210 mg/dL — ABNORMAL HIGH (ref 70–99)
Glucose-Capillary: 207 mg/dL — ABNORMAL HIGH (ref 70–99)
Glucose-Capillary: 169 mg/dL — ABNORMAL HIGH (ref 70–99)
Glucose-Capillary: 109 mg/dL — ABNORMAL HIGH (ref 70–99)
Glucose-Capillary: 152 mg/dL — ABNORMAL HIGH (ref 70–99)

## 2023-03-05 MED ORDER — ZINC CHLORIDE 1 MG/ML IV SOLN
INTRAVENOUS | Status: AC
  Filled 2023-03-05: qty 653.33

## 2023-03-05 NOTE — Progress Notes (Signed)
PHARMACY - TOTAL PARENTERAL NUTRITION CONSULT NOTE   Indication: Prolonged ileus  Patient Measurements: Height: 4\' 11"  (149.9 cm) Weight: 108.8 kg (239 lb 13.8 oz) IBW/kg (Calculated) : 43.2 TPN AdjBW (KG): 57.8 Body mass index is 48.45 kg/m.  Assessment: Debra [name] is a 57 y.o. female s/p laparotomy, excision of greater omental mass, abdominal wall reconstruction with [name], appendectomy, and placement of Prevena vac.  Glucose / Insulin:  --No apparent history of diabetes --mSSI 4x/day (0500, 1300, 1800, 2200) --BG last 24h: 75-217 > 112-210 --SSI last 24h: 13 units > 13 units Electrolytes:electrolytes wnl Renal: SCr stable at baseline Hepatic: Mild transaminitis GI Imaging: 9/11 CTAP: no new acute issues GI Surgeries / Procedures: s/p laparotomy, excision of greater omental mass, abdominal wall reconstruction with [name], appendectomy, and placement of Prevena vac 12/19/22 placement of right IJ port-a-catheter with dual reservoirs also s/p re-opening of laparotomy for repair of small bowel perforation   Central access: 02/15/22 TPN start date: 02/15/22  Nutritional Goals: Goal cyclic TPN over 16 hrs: cyclic TPN over 16 hrs: (provides 102 g of protein and 1811 kcals per day) total volume over 16hrs for calculations=1400 ml (1500 ml total with overfill)  RD Assessment:  Estimated Needs Total Energy Estimated Needs: 1800-2100kcal/day Total Protein Estimated Needs: 90-110g/d Total Fluid Estimated Needs: 1.4-1.6L/day  Current Nutrition:  Soft diet + nutritional supplements, not meeting PO needs  Plan:  Transitioned to *Cyclic* TPN on 0/10/27.   to run over 16 hours per MD request. [name] at 2000 per discussion with dietician to allow patient time for walking in afternoon/evenings.  To run over 16 hours: -Start rate at 49 mL/hr for 1 hour. -Increase rate to 98 mL/hr for 13 hours.  -Decrease rate to 49 mL/hr for 1 hour. -Decrease rate  to 25 mL/hr for 1 hour, then stop.  Plan:  See cyclic TPN rate above Nutritional Components Amino acids (using 15% Clinisol): 98 g Dextrose 19% = 266 g Lipids (using 20% SMOFlipids): 50.4 g kCal: 1817/24h  Electrolytes in TPN: Na 75 mEq/L, K 50 mEq/L, Ca 64mEq/L, Mg 10 mEq/L, Phos 10 mmol/L, Cl:Ac 1:1  trace elements, MVI, chromium, zinc (increased to 17g on 6/22) and selenium (increased to 150mg  on 6/22) per dietary recs  Continue CBG/SSI for Cyclic TPN:  -CBG 2 hrs after cyclic TPN start -CBG during middle of cyclic TPN -CBG 1 hr after cylic TPN stopped -CBG while off TPN Continue SSI insulin at moderate scale and increase 9>11u insulin in TPN Check TPN labs weekly on Mondays and more frequently if indicated  Francetta Found, PharmD Candidate Class of 2025  03/05/2023

## 2023-03-05 NOTE — Progress Notes (Signed)
[facility] SURGICAL PROGRESS NOTE   Hospital Day(s): 385.   Post op day(s): 76 Days Post-Op.   Interval History:  Patient seen and examined Nothing acute overnight Sleeping this morning; arouses No complaints  No abdominal pain, nausea, emesis, fever No new labs  [name] output 50 ccs + unmeasured in last 24 hours; managing effluent independently No bleeding noted On cyclic TPN + soft diet  Ambulating well; independent  Review of Systems:  Constitutional: denies fever, chills  HEENT: denies cough or congestion  Respiratory: denies any shortness of breath  Cardiovascular: denies chest pain or palpitations  Gastrointestinal: denied abdominal pain, denied N/V Genitourinary: denies burning with urination or urinary frequency Integumentary: + midline wound (healing; stable)  Vital signs in last 24 hours: [min-max] current  Temp:  [98 F (36.7 C)-98.6 F (37 C)] 98.6 F (37 C) (06/27 0359) Pulse Rate:  [70-81] 81 (06/27 0359) Resp:  [16-18] 18 (06/27 0359) BP: (105-124)/(55-74) 105/55 (06/27 0359) SpO2:  [95 %-100 %] 95 % (06/27 0359) Weight:  [108.8 kg] 108.8 kg (06/27 0359)     Height: 4\' 11"  (149.9 cm) Weight: 108.8 kg BMI (Calculated): 46.02   Intake/Output last 2 shifts:  06/26 0701 - 06/27 0700 In: 966.4 [I.V.:966.4] Out: -    Physical Exam:  Constitutional: alert, cooperative and no distress  Respiratory: breathing non-labored at rest  Cardiovascular: regular rate and sinus rhythm  Gastrointestinal: Soft, non-tender, non-distended, no rebound/guarding. Integumentary: Midline wound healing by secondary intention, peritoneum closed; granulating and new skin growing over majority of wound now; there are three stomatized areas visible in the LUQ and LLQ portions of the wound. No active bleeding. Piece of ST mesh noted.  Chest: Port to the right chest; no further swelling or drainage; no evidence of infection   Labs:     Latest Ref Rng & Units  02/20/2023    9:15 AM 02/02/2023    5:00 AM 01/27/2023    7:44 AM  CBC  WBC 4.0 - 10.5 K/uL 2.2  2.3  2.4   Hemoglobin 12.0 - 15.0 g/dL 8.8  8.3  7.5   Hematocrit 36.0 - 46.0 % 27.7  26.3  24.0   Platelets 150 - 400 K/uL 70  76  73       Latest Ref Rng & Units 03/02/2023    7:17 AM 02/20/2023    9:15 AM 02/16/2023    7:43 AM  CMP  Glucose 70 - 99 mg/dL 914  782  956   BUN 6 - 20 mg/dL 24  23  22    Creatinine 0.44 - 1.00 mg/dL 2.13  0.86  5.78   Sodium 135 - 145 mmol/L 139  137  134   Potassium 3.5 - 5.1 mmol/L 4.0  4.2  4.2   Chloride 98 - 111 mmol/L 107  106  104   CO2 22 - 32 mmol/L 27  26  25    Calcium 8.9 - 10.3 mg/dL 8.3  8.2  8.1   Total Protein 6.5 - 8.1 g/dL 6.0  5.7  5.8   Total Bilirubin 0.3 - 1.2 mg/dL 0.5  0.6  0.7   Alkaline Phos 38 - 126 U/L 147  147  153   AST 15 - 41 U/L 50  48  65   ALT 0 - 44 U/L 35  32  39     Imaging studies: No new imaging studies this AM   Assessment/Plan:  57 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  - New 06/27: No acute changes in last 24 hours to note   - Wound Care ([name] Pouch); Now on smaller [name] pouch; continues managing effluent independently. Intermittent oozing from time to time. Change as needed.    - Continue soft diet as tolerated  - Continue cyclic TPN; weekly nutritional labs - Appreciate dietary assistance - monitoring weight; now on daily Lasix as of 05/20             - Monitor abdominal examination; on-going bowel function            - Pain control prn; antemetic prn            - Progressed with therapies; no longer any recommendations; ambulating well    - Discharge Planning: Continue  to anticipate lengthy admission +/- possible transfer; no new progress regarding disposition unfortunately   All of the above findings and recommendations were discussed with the patient, and the medical team, and all of patient's questions were answered to her expressed satisfaction.  -- [name], PA-C [facility] 03/05/2023, 7:23 AM M-F: 7am - 4pm

## 2023-03-06 LAB — COMPREHENSIVE METABOLIC PANEL
ALT: 36 U/L (ref 0–44)
AST: 53 U/L — ABNORMAL HIGH (ref 15–41)
Albumin: 2.4 g/dL — ABNORMAL LOW (ref 3.5–5.0)
Alkaline Phosphatase: 158 U/L — ABNORMAL HIGH (ref 38–126)
Anion gap: 6 (ref 5–15)
BUN: 25 mg/dL — ABNORMAL HIGH (ref 6–20)
CO2: 26 mmol/L (ref 22–32)
Calcium: 8.2 mg/dL — ABNORMAL LOW (ref 8.9–10.3)
Chloride: 107 mmol/L (ref 98–111)
Creatinine, Ser: 0.47 mg/dL (ref 0.44–1.00)
GFR, Estimated: >60 mL/min (ref >60)
Glucose, Bld: 191 mg/dL — ABNORMAL HIGH (ref 70–99)
Potassium: 3.7 mmol/L (ref 3.5–5.1)
Sodium: 139 mmol/L (ref 135–145)
Total Bilirubin: 0.5 mg/dL (ref 0.3–1.2)
Total Protein: 5.7 g/dL — ABNORMAL LOW (ref 6.5–8.1)

## 2023-03-06 LAB — CBC
HCT: 27.4 % — ABNORMAL LOW (ref 36.0–46.0)
Hemoglobin: 8.6 g/dL — ABNORMAL LOW (ref 12.0–15.0)
MCH: 29 pg (ref 26.0–34.0)
MCHC: 31.4 g/dL (ref 30.0–36.0)
MCV: 92.3 fL (ref 80.0–100.0)
Platelets: 67 10*3/uL — ABNORMAL LOW (ref 150–400)
RBC: 2.97 MIL/uL — ABNORMAL LOW (ref 3.87–5.11)
RDW: 18.9 % — ABNORMAL HIGH (ref 11.5–15.5)
WBC: 2.5 10*3/uL — ABNORMAL LOW (ref 4.0–10.5)
nRBC: 0 % (ref 0.0–0.2)

## 2023-03-06 LAB — PHOSPHORUS: Phosphorus: 3.1 mg/dL (ref 2.5–4.6)

## 2023-03-06 LAB — MAGNESIUM: Magnesium: 2.2 mg/dL (ref 1.7–2.4)

## 2023-03-06 LAB — GLUCOSE, CAPILLARY
Glucose-Capillary: 161 mg/dL — ABNORMAL HIGH (ref 70–99)
Glucose-Capillary: 127 mg/dL — ABNORMAL HIGH (ref 70–99)
Glucose-Capillary: 132 mg/dL — ABNORMAL HIGH (ref 70–99)
Glucose-Capillary: 187 mg/dL — ABNORMAL HIGH (ref 70–99)

## 2023-03-06 MED ORDER — ZINC CHLORIDE 1 MG/ML IV SOLN
INTRAVENOUS | Status: AC
  Filled 2023-03-06: qty 653.33

## 2023-03-06 NOTE — Progress Notes (Signed)
PHARMACY - TOTAL PARENTERAL NUTRITION CONSULT NOTE   Indication: Prolonged ileus  Patient Measurements: Height: 4\' 11"  (149.9 cm) Weight: 108.7 kg (239 lb 10.2 oz) IBW/kg (Calculated) : 43.2 TPN AdjBW (KG): 57.8 Body mass index is 48.4 kg/m.  Assessment: Helon Eshaal Skillings is a 57 y.o. female s/p laparotomy, excision of greater omental mass, abdominal wall reconstruction with [name], appendectomy, and placement of Prevena vac.  Glucose / Insulin:  --No apparent history of diabetes --mSSI 4x/day (0500, 1300, 1800, 2200) --BG last 24h: Ranging in 200s > 109-191 --SSI last 24h: 13 units > 9 units Electrolytes:electrolytes wnl Renal: SCr stable at baseline Hepatic: Alk Phos 147>158, AST 53, ALT 36, TBili Stable GI Imaging: 9/11 CTAP: no new acute issues GI Surgeries / Procedures: s/p laparotomy, excision of greater omental mass, abdominal wall reconstruction with [name], appendectomy, and placement of Prevena vac 12/19/22 placement of right IJ port-a-catheter with dual reservoirs also s/p re-opening of laparotomy for repair of small bowel perforation   Central access: 02/15/22 TPN start date: 02/15/22  Nutritional Goals: Goal cyclic TPN over 16 hrs: cyclic TPN over 16 hrs: (provides 102 g of protein and 1811 kcals per day) total volume over 16hrs for calculations=1400 ml (1500 ml total with overfill)  RD Assessment:  Estimated Needs Total Energy Estimated Needs: 1800-2100kcal/day Total Protein Estimated Needs: 90-110g/d Total Fluid Estimated Needs: 1.4-1.6L/day  Current Nutrition:  Soft diet + nutritional supplements, not meeting PO needs  Plan:  Transitioned to *Cyclic* TPN on 10/07/84.   to run over 16 hours per MD request. [name] at 2000 per discussion with dietician to allow patient time for walking in afternoon/evenings.  To run over 16 hours: -Start rate at 49 mL/hr for 1 hour. -Increase rate to 98 mL/hr for 13 hours.  -Decrease rate to 49  mL/hr for 1 hour. -Decrease rate to 25 mL/hr for 1 hour, then stop.  Plan:  See cyclic TPN rate above Nutritional Components Amino acids (using 15% Clinisol): 98 g Dextrose 19% = 266 g Lipids (using 20% SMOFlipids): 50.4 g kCal: 1817/24h  Electrolytes in TPN: Na 75 mEq/L, K 50 mEq/L, Ca 68mEq/L, Mg 10 mEq/L, Phos 10 mmol/L, Cl:Ac 1:1  trace elements, MVI, chromium, zinc (increased to 17g on 6/22) and selenium (increased to 150mg  on 6/22) per dietary recs  Continue CBG/SSI for Cyclic TPN:  -CBG 2 hrs after cyclic TPN start -CBG during middle of cyclic TPN -CBG 1 hr after cylic TPN stopped -CBG while off TPN Continue SSI insulin at moderate scale and 11u insulin in TPN Check TPN labs weekly on Mondays and more frequently if indicated  Francetta Found, PharmD Candidate Class of 2025  03/06/2023

## 2023-03-06 NOTE — Progress Notes (Signed)
Debra [name], Debra [name], Debra [name], Debra [name] noted On cyclic TPN + soft diet  Ambulating well; independent  Review of Systems:  Constitutional: denies Debra, chills  HEENT: denies cough or congestion  Respiratory: denies any shortness of breath  Cardiovascular: denies chest [name] or palpitations  Gastrointestinal: denied abdominal [name], denied N/V Genitourinary: denies burning with urination or urinary frequency Integumentary: + midline wound (healing; stable)  Vital signs in last 24 hours: [min-max] current  Temp:  [98.5 F (36.9 C)] 98.5 F (36.9 C) (06/28 0525) Pulse Rate:  [72-85] 85 (06/28 0525) Resp:  [14-16] 14 (06/28 0525) BP: (100-104)/(60-61) 100/61 (06/28 0525) SpO2:  [97 %-99 %] 97 % (06/28 0525) Weight:  [108.7 kg] 108.7 kg (06/28 0458)     Height: 4\' 11"  (149.9 cm) Weight: 108.7 kg BMI (Calculated): 46.02   Intake/Output last 2 shifts:  06/27 0701 - 06/28 0700 In: 1574 [P.O.:320; I.V.:1254] Out: -    Physical Exam:  Constitutional: alert, cooperative and no distress  Respiratory: breathing non-labored at rest  Cardiovascular: regular rate and sinus rhythm  Gastrointestinal: Soft, non-tender, non-distended, no rebound/guarding. Integumentary: Midline wound healing by secondary intention, peritoneum closed; granulating and new skin growing over majority of wound now; there are three stomatized areas visible in the LUQ and LLQ portions of the wound. No active [name]. Piece of ST mesh noted.  Chest: Port to the right chest; no further swelling or drainage; no evidence of infection   Labs:     Latest Ref Rng & Units 03/06/2023    7:11 AM  02/20/2023    9:15 AM 02/02/2023    5:00 AM  CBC  WBC 4.0 - 10.5 K/uL 2.5  2.2  2.3   Hemoglobin 12.0 - 15.0 g/dL 8.6  8.8  8.3   Hematocrit 36.0 - 46.0 % 27.4  27.7  26.3   Platelets 150 - 400 K/uL 67  70  76       Latest Ref Rng & Units 03/02/2023    7:17 AM 02/20/2023    9:15 AM 02/16/2023    7:43 AM  CMP  Glucose 70 - 99 mg/dL 981  191  478   BUN 6 - 20 mg/dL 24  23  22    Creatinine 0.44 - 1.00 mg/dL 2.95  6.21  3.08   Sodium 135 - 145 mmol/L 139  137  134   Potassium 3.5 - 5.1 mmol/L 4.0  4.2  4.2   Chloride 98 - 111 mmol/L 107  106  104   CO2 22 - 32 mmol/L 27  26  25    Calcium 8.9 - 10.3 mg/dL 8.3  8.2  8.1   Total Protein 6.5 - 8.1 g/dL 6.0  5.7  5.8   Total Bilirubin 0.3 - 1.2 mg/dL 0.5  0.6  0.7   Alkaline Phos 38 - 126 U/L 147  147  153   AST 15 - 41 U/L 50  48  65   ALT 0 - 44 U/L 35  32  39     Imaging studies: No new imaging studies this AM   Assessment/Plan:  57 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  - New 06/28: Nothing acute in last 24 hours.   - Wound Care ([name] Pouch); Now on smaller [name] pouch; continues managing effluent independently. Intermittent oozing from time to time. Change as needed.    - Continue soft diet as tolerated  - Continue cyclic TPN; weekly nutritional labs - Appreciate dietary assistance - monitoring weight; now on daily Lasix as of 05/20             - Monitor abdominal examination; on-going bowel function            - [name] control prn; antemetic prn            - Progressed with therapies; no longer any recommendations; ambulating well    - Discharge Planning: Tentative plan for definitive repair in  next 2-4 weeks; timing per Dr Everlene Farrier    All of the above findings and recommendations were discussed with the patient, and the medical team, and all of patient's questions were answered to her expressed satisfaction.  -- [name], PA-C [facility] 03/06/2023, 7:35 AM M-F: 7am - 4pm

## 2023-03-07 MED ORDER — ZINC CHLORIDE 1 MG/ML IV SOLN
INTRAVENOUS | Status: AC
  Filled 2023-03-07: qty 653.33

## 2023-03-07 NOTE — Progress Notes (Signed)
PHARMACY - TOTAL PARENTERAL NUTRITION CONSULT NOTE   Indication: Prolonged ileus  Patient Measurements: Height: 4\' 11"  (149.9 cm) Weight: 109.5 kg (241 lb 6.5 oz) IBW/kg (Calculated) : 43.2 TPN AdjBW (KG): 57.8 Body mass index is 48.76 kg/m.  Assessment: Mykal Hiral Der is a 57 y.o. female s/p laparotomy, excision of greater omental mass, abdominal wall reconstruction with [name], appendectomy, and placement of Prevena vac.  Glucose / Insulin:  --No apparent history of diabetes --mSSI 4x/day (0500, 1300, 1800, 2200) --BG last 24h: 127-187 --SSI last 24h: 10 units Electrolytes:electrolytes wnl Renal: SCr stable at baseline Hepatic: Alk Phos 147>158, AST 53, ALT 36, TBili Stable GI Imaging: 9/11 CTAP: no new acute issues GI Surgeries / Procedures: s/p laparotomy, excision of greater omental mass, abdominal wall reconstruction with [name], appendectomy, and placement of Prevena vac 12/19/22 placement of right IJ port-a-catheter with dual reservoirs also s/p re-opening of laparotomy for repair of small bowel perforation   Central access: 02/15/22 TPN start date: 02/15/22  Nutritional Goals: Goal cyclic TPN over 16 hrs: cyclic TPN over 16 hrs: (provides 102 g of protein and 1811 kcals per day) total volume over 16hrs for calculations=1400 ml (1500 ml total with overfill)  RD Assessment:  Estimated Needs Total Energy Estimated Needs: 1800-2100kcal/day Total Protein Estimated Needs: 90-110g/d Total Fluid Estimated Needs: 1.4-1.6L/day  Current Nutrition:  Soft diet + nutritional supplements, not meeting PO needs  Plan:  Transitioned to *Cyclic* TPN on 1/61/09.   to run over 16 hours per MD request. [name] at 2000 per discussion with dietician to allow patient time for walking in afternoon/evenings.  To run over 16 hours: -Start rate at 49 mL/hr for 1 hour. -Increase rate to 98 mL/hr for 13 hours.  -Decrease rate to 49 mL/hr for 1 hour. -Decrease  rate to 25 mL/hr for 1 hour, then stop.  Plan:  See cyclic TPN rate above Nutritional Components Amino acids (using 15% Clinisol): 98 g Dextrose 19% = 266 g Lipids (using 20% SMOFlipids): 50.4 g kCal: 1817/24h  Electrolytes in TPN: Na 75 mEq/L, K 50 mEq/L, Ca 74mEq/L, Mg 10 mEq/L, Phos 10 mmol/L, Cl:Ac 1:1  trace elements, MVI, chromium, zinc (increased to 17g on 6/22) and selenium (increased to 150mg  on 6/22) per dietary recs  Continue CBG/SSI for Cyclic TPN:  -CBG 2 hrs after cyclic TPN start -CBG during middle of cyclic TPN -CBG 1 hr after cylic TPN stopped -CBG while off TPN Continue SSI insulin at moderate scale and 11u insulin in TPN Check TPN labs weekly on Mondays and more frequently if indicated  Francetta Found, PharmD Candidate Class of 2025  03/07/2023

## 2023-03-07 NOTE — Progress Notes (Signed)
Patient seen and examined.  No new changes.  No more bleeding.  Ambulating.  Continue [name] pouch and TPN.

## 2023-03-08 LAB — GLUCOSE, CAPILLARY
Glucose-Capillary: 92 mg/dL (ref 70–99)
Glucose-Capillary: 147 mg/dL — ABNORMAL HIGH (ref 70–99)

## 2023-03-08 MED ORDER — ZINC CHLORIDE 1 MG/ML IV SOLN
INTRAVENOUS | Status: AC
  Filled 2023-03-08: qty 653.33

## 2023-03-08 NOTE — Progress Notes (Signed)
Patient seen and examined. No new changes. Doing well Ambulating. Continue [name] pouch and TPN.

## 2023-03-08 NOTE — Progress Notes (Signed)
PHARMACY - TOTAL PARENTERAL NUTRITION CONSULT NOTE   Indication: Prolonged ileus  Patient Measurements: Height: 4\' 11"  (149.9 cm) Weight: 108.9 kg (240 lb) IBW/kg (Calculated) : 43.2 TPN AdjBW (KG): 57.8 Body mass index is 48.47 kg/m.  Assessment: Debra [name] is a 57 y.o. female s/p laparotomy, excision of greater omental mass, abdominal wall reconstruction with [name], appendectomy, and placement of Prevena vac.  Glucose / Insulin:  --No apparent history of diabetes --mSSI 4x/day (0500, 1300, 1800, 2200) --BG last 24h: no recent check d/t patient refusing --SSI last 24h: 10 units Electrolytes:electrolytes wnl Renal: SCr stable at baseline Hepatic: Alk Phos 147>158, AST 53, ALT 36, TBili Stable GI Imaging: 9/11 CTAP: no new acute issues GI Surgeries / Procedures: s/p laparotomy, excision of greater omental mass, abdominal wall reconstruction with [name], appendectomy, and placement of Prevena vac 12/19/22 placement of right IJ port-a-catheter with dual reservoirs also s/p re-opening of laparotomy for repair of small bowel perforation   Central access: 02/15/22 TPN start date: 02/15/22  Nutritional Goals: Goal cyclic TPN over 16 hrs: cyclic TPN over 16 hrs: (provides 102 g of protein and 1811 kcals per day) total volume over 16hrs for calculations=1400 ml (1500 ml total with overfill)  RD Assessment:  Estimated Needs Total Energy Estimated Needs: 1800-2100kcal/day Total Protein Estimated Needs: 90-110g/d Total Fluid Estimated Needs: 1.4-1.6L/day  Current Nutrition:  Soft diet + nutritional supplements, not meeting PO needs  Plan:  Transitioned to *Cyclic* TPN on 1/61/09.   to run over 16 hours per MD request. [name] at 2000 per discussion with dietician to allow patient time for walking in afternoon/evenings.  To run over 16 hours: -Start rate at 49 mL/hr for 1 hour. -Increase rate to 98 mL/hr for 13 hours.  -Decrease rate to 49 mL/hr  for 1 hour. -Decrease rate to 25 mL/hr for 1 hour, then stop.  Plan:  See cyclic TPN rate above Nutritional Components Amino acids (using 15% Clinisol): 98 g Dextrose 19% = 266 g Lipids (using 20% SMOFlipids): 50.4 g kCal: 1817/24h  Electrolytes in TPN: Na 75 mEq/L, K 50 mEq/L, Ca 74mEq/L, Mg 10 mEq/L, Phos 10 mmol/L, Cl:Ac 1:1  trace elements, MVI, chromium, zinc (increased to 17g on 6/22) and selenium (increased to 150mg  on 6/22) per dietary recs  Continue CBG/SSI for Cyclic TPN:  -CBG 2 hrs after cyclic TPN start -CBG during middle of cyclic TPN -CBG 1 hr after cylic TPN stopped -CBG while off TPN Continue SSI insulin at moderate scale and 11u insulin in TPN Check TPN labs weekly on Mondays and more frequently if indicated  Bettey Costa, PharmD Clinical Pharmacist 03/08/2023 9:08 AM

## 2023-03-09 LAB — GLUCOSE, CAPILLARY
Glucose-Capillary: 179 mg/dL — ABNORMAL HIGH (ref 70–99)
Glucose-Capillary: 160 mg/dL — ABNORMAL HIGH (ref 70–99)
Glucose-Capillary: 121 mg/dL — ABNORMAL HIGH (ref 70–99)
Glucose-Capillary: 144 mg/dL — ABNORMAL HIGH (ref 70–99)
Glucose-Capillary: 172 mg/dL — ABNORMAL HIGH (ref 70–99)
Glucose-Capillary: 130 mg/dL — ABNORMAL HIGH (ref 70–99)
Glucose-Capillary: 193 mg/dL — ABNORMAL HIGH (ref 70–99)
Glucose-Capillary: 129 mg/dL — ABNORMAL HIGH (ref 70–99)
Glucose-Capillary: 141 mg/dL — ABNORMAL HIGH (ref 70–99)

## 2023-03-09 LAB — COMPREHENSIVE METABOLIC PANEL
ALT: 34 U/L (ref 0–44)
AST: 50 U/L — ABNORMAL HIGH (ref 15–41)
Albumin: 2.4 g/dL — ABNORMAL LOW (ref 3.5–5.0)
Alkaline Phosphatase: 159 U/L — ABNORMAL HIGH (ref 38–126)
Anion gap: 7 (ref 5–15)
BUN: 23 mg/dL — ABNORMAL HIGH (ref 6–20)
CO2: 25 mmol/L (ref 22–32)
Calcium: 7.9 mg/dL — ABNORMAL LOW (ref 8.9–10.3)
Chloride: 103 mmol/L (ref 98–111)
Creatinine, Ser: 0.47 mg/dL (ref 0.44–1.00)
GFR, Estimated: >60 mL/min (ref >60)
Glucose, Bld: 210 mg/dL — ABNORMAL HIGH (ref 70–99)
Potassium: 3.4 mmol/L — ABNORMAL LOW (ref 3.5–5.1)
Sodium: 135 mmol/L (ref 135–145)
Total Bilirubin: 0.7 mg/dL (ref 0.3–1.2)
Total Protein: 5.8 g/dL — ABNORMAL LOW (ref 6.5–8.1)

## 2023-03-09 LAB — PHOSPHORUS: Phosphorus: 3.4 mg/dL (ref 2.5–4.6)

## 2023-03-09 LAB — MAGNESIUM: Magnesium: 2.1 mg/dL (ref 1.7–2.4)

## 2023-03-09 MED ORDER — POTASSIUM CHLORIDE CRYS ER 20 MEQ PO TBCR
20.0000 meq | EXTENDED_RELEASE_TABLET | ORAL | Status: AC
  Administered 2023-03-09 (×2): 20 meq via ORAL
  Filled 2023-03-09 (×2): qty 1

## 2023-03-09 MED ORDER — ZINC CHLORIDE 1 MG/ML IV SOLN
INTRAVENOUS | Status: AC
  Filled 2023-03-09: qty 653.33

## 2023-03-09 MED ORDER — VITAMIN A 3 MG (10000 UNIT) PO CAPS
20000.0000 [IU] | ORAL_CAPSULE | Freq: Every day | ORAL | Status: DC
  Administered 2023-03-09 – 2023-04-02 (×25): 20000 [IU] via ORAL
  Filled 2023-03-09 (×25): qty 2

## 2023-03-09 NOTE — Progress Notes (Signed)
[facility] SURGICAL PROGRESS NOTE   Hospital Day(s): 389.   Post op day(s): 80 Days Post-Op.   Interval History:  Patient seen and examined Nothing acute overnight No complaints this morning [name] changed 06/30 No abdominal pain, nausea, emesis, fever Mild hypokalemia to 3.4 [name] output unmeasured in last 24 hours; managing effluent independently No bleeding noted On cyclic TPN + soft diet  Ambulating well; independent  Review of Systems:  Constitutional: denies fever, chills  HEENT: denies cough or congestion  Respiratory: denies any shortness of breath  Cardiovascular: denies chest pain or palpitations  Gastrointestinal: denied abdominal pain, denied N/V Genitourinary: denies burning with urination or urinary frequency Integumentary: + midline wound (healing; stable)  Vital signs in last 24 hours: [min-max] current  Temp:  [98 F (36.7 C)-98.6 F (37 C)] 98.4 F (36.9 C) (07/01 0441) Pulse Rate:  [66-84] 84 (07/01 0441) Resp:  [18-20] 18 (07/01 0441) BP: (98-118)/(51-70) 98/55 (07/01 0441) SpO2:  [96 %-100 %] 96 % (07/01 0441)     Height: 4\' 11"  (149.9 cm) Weight: 108.9 kg BMI (Calculated): 46.02   Intake/Output last 2 shifts:  06/30 0701 - 07/01 0700 In: 990.6 [I.V.:990.6] Out: -    Physical Exam:  Constitutional: alert, cooperative and no distress  Respiratory: breathing non-labored at rest  Cardiovascular: regular rate and sinus rhythm  Gastrointestinal: Soft, non-tender, non-distended, no rebound/guarding. Integumentary: Midline wound healing by secondary intention, peritoneum closed; granulating and new skin growing over majority of wound now; there are three stomatized areas visible in the LUQ and LLQ portions of the wound. No active bleeding. Piece of ST mesh noted.  Chest: Port to the right chest; no further swelling or drainage; no evidence of infection   Labs:     Latest Ref Rng & Units 03/06/2023    7:11 AM 02/20/2023    9:15 AM  02/02/2023    5:00 AM  CBC  WBC 4.0 - 10.5 K/uL 2.5  2.2  2.3   Hemoglobin 12.0 - 15.0 g/dL 8.6  8.8  8.3   Hematocrit 36.0 - 46.0 % 27.4  27.7  26.3   Platelets 150 - 400 K/uL 67  70  76       Latest Ref Rng & Units 03/09/2023    4:32 AM 03/06/2023    7:11 AM 03/02/2023    7:17 AM  CMP  Glucose 70 - 99 mg/dL 161  096  045   BUN 6 - 20 mg/dL 23  25  24    Creatinine 0.44 - 1.00 mg/dL 4.09  8.11  9.14   Sodium 135 - 145 mmol/L 135  139  139   Potassium 3.5 - 5.1 mmol/L 3.4  3.7  4.0   Chloride 98 - 111 mmol/L 103  107  107   CO2 22 - 32 mmol/L 25  26  27    Calcium 8.9 - 10.3 mg/dL 7.9  8.2  8.3   Total Protein 6.5 - 8.1 g/dL 5.8  5.7  6.0   Total Bilirubin 0.3 - 1.2 mg/dL 0.7  0.5  0.5   Alkaline Phos 38 - 126 U/L 159  158  147   AST 15 - 41 U/L 50  53  50   ALT 0 - 44 U/L 34  36  35     Imaging studies: No new imaging studies this AM   Assessment/Plan:  57 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  - New 07/01: No acute changes this morning   - Wound Care ([name] Pouch); Now on smaller [name] pouch; continues managing effluent independently. Intermittent oozing from time to time. Change as needed.    - Continue soft diet as tolerated  - Continue cyclic TPN; weekly nutritional labs - Appreciate dietary assistance - monitoring weight; now on daily Lasix as of 05/20             - Monitor abdominal examination; on-going bowel function            - Pain control prn; antemetic prn            - Progressed with therapies; no longer any recommendations; ambulating well    - Discharge Planning: Tentative plan for definitive repair in next 2-4 weeks; timing per  Dr Everlene Farrier    All of the above findings and recommendations were discussed with the patient, and the medical team, and all of patient's questions were answered to her expressed satisfaction.  -- [name], PA-C [facility] 03/09/2023, 7:54 AM M-F: 7am - 4pm

## 2023-03-09 NOTE — Progress Notes (Signed)
FSBS 193, did not flow over

## 2023-03-09 NOTE — Progress Notes (Signed)
PHARMACY - TOTAL PARENTERAL NUTRITION CONSULT NOTE   Indication: Prolonged ileus  Patient Measurements: Height: 4\' 11"  (149.9 cm) Weight: 108.9 kg (240 lb) IBW/kg (Calculated) : 43.2 TPN AdjBW (KG): 57.8 Body mass index is 48.47 kg/m.  Assessment: [name] is a 57 y.o. female s/p laparotomy, excision of greater omental mass, abdominal wall reconstruction with [name], appendectomy, and placement of Prevena vac.  Glucose / Insulin:  --No apparent history of diabetes --mSSI 4x/day (0500, 1300, 1800, 2200) --BG last 24h: 92-210 --SSI last 24h: 7 units Electrolytes:K + 3.4>> Kcl 20 mEq po q4h x 2 Renal: SCr stable at baseline Hepatic: Alk Phos 147>158, AST 50, ALT 34, TBili Stable GI Imaging: 9/11 CTAP: no new acute issues GI Surgeries / Procedures: s/p laparotomy, excision of greater omental mass, abdominal wall reconstruction with [name], appendectomy, and placement of Prevena vac 12/19/22 placement of right IJ port-a-catheter with dual reservoirs also s/p re-opening of laparotomy for repair of small bowel perforation   Central access: 02/15/22 TPN start date: 02/15/22  Nutritional Goals: Goal cyclic TPN over 16 hrs: cyclic TPN over 16 hrs: (provides 102 g of protein and 1811 kcals per day) total volume over 16hrs for calculations=1400 ml (1500 ml total with overfill)  RD Assessment:  Estimated Needs Total Energy Estimated Needs: 1800-2100kcal/day Total Protein Estimated Needs: 90-110g/d Total Fluid Estimated Needs: 1.4-1.6L/day  Current Nutrition:  Soft diet + nutritional supplements, not meeting PO needs  Plan:  Transitioned to *Cyclic* TPN on 1/61/09.   to run over 16 hours per MD request. [name] at 2000 per discussion with dietician to allow patient time for walking in afternoon/evenings.  To run over 16 hours: -Start rate at 49 mL/hr for 1 hour. -Increase rate to 98 mL/hr for 13 hours.  -Decrease rate to 49 mL/hr for 1  hour. -Decrease rate to 25 mL/hr for 1 hour, then stop.  Plan:  See cyclic TPN rate above Nutritional Components Amino acids (using 15% Clinisol): 98 g Dextrose 19% = 266 g Lipids (using 20% SMOFlipids): 50.4 g kCal: 1817/24h  Electrolytes in TPN: Na 75 mEq/L, K 50 mEq/L, Ca 59mEq/L, Mg 10 mEq/L, Phos 10 mmol/L, Cl:Ac 1:1  trace elements, MVI, chromium, zinc (increased to 17g on 6/22) and selenium (increased to 150mg  on 6/22) per dietary recs  Continue CBG/SSI for Cyclic TPN:  -CBG 2 hrs after cyclic TPN start -CBG during middle of cyclic TPN -CBG 1 hr after cylic TPN stopped -CBG while off TPN Continue SSI insulin at moderate scale and increase to 13u insulin in TPN Check TPN labs weekly on Mondays and more frequently if indicated  Barrie Folk, PharmD Clinical Pharmacist 03/09/2023 8:31 AM

## 2023-03-10 LAB — GLUCOSE, CAPILLARY
Glucose-Capillary: 177 mg/dL — ABNORMAL HIGH (ref 70–99)
Glucose-Capillary: 147 mg/dL — ABNORMAL HIGH (ref 70–99)
Glucose-Capillary: 167 mg/dL — ABNORMAL HIGH (ref 70–99)
Glucose-Capillary: 91 mg/dL (ref 70–99)

## 2023-03-10 MED ORDER — ZINC CHLORIDE 1 MG/ML IV SOLN
INTRAVENOUS | Status: AC
  Filled 2023-03-10: qty 653.33

## 2023-03-10 NOTE — Progress Notes (Signed)
[facility] SURGICAL PROGRESS NOTE   Hospital Day(s): 390.   Post op day(s): 81 Days Post-Op.   Interval History:  Patient seen and examined Nothing acute overnight No abdominal pain, nausea, emesis, fever [name] output unmeasured in last 24 hours; managing effluent independently No bleeding noted On cyclic TPN + soft diet  Ambulating well; independent  Review of Systems:  Constitutional: denies fever, chills  HEENT: denies cough or congestion  Respiratory: denies any shortness of breath  Cardiovascular: denies chest pain or palpitations  Gastrointestinal: denied abdominal pain, denied N/V Genitourinary: denies burning with urination or urinary frequency Integumentary: + midline wound (healing; stable)  Vital signs in last 24 hours: [min-max] current  Temp:  [98 F (36.7 C)-98.7 F (37.1 C)] 98.7 F (37.1 C) (07/02 0356) Pulse Rate:  [69-80] 80 (07/02 0356) Resp:  [16-20] 20 (07/02 0356) BP: (97-112)/(54-62) 107/58 (07/02 0356) SpO2:  [96 %-100 %] 97 % (07/02 0356) Weight:  [107 kg] 107 kg (07/02 0500)     Height: 4\' 11"  (149.9 cm) Weight: 107 kg BMI (Calculated): 46.02   Intake/Output last 2 shifts:  07/01 0701 - 07/02 0700 In: 1025.2 [P.O.:240; I.V.:785.2] Out: -    Physical Exam:  Constitutional: alert, cooperative and no distress  Respiratory: breathing non-labored at rest  Cardiovascular: regular rate and sinus rhythm  Gastrointestinal: Soft, non-tender, non-distended, no rebound/guarding. Integumentary: Midline wound healing by secondary intention, peritoneum closed; granulating and new skin growing over majority of wound now; there are three stomatized areas visible in the LUQ and LLQ portions of the wound. No active bleeding. Piece of ST mesh noted.  Chest: Port to the right chest; no further swelling or drainage; no evidence of infection   Labs:     Latest Ref Rng & Units 03/06/2023    7:11 AM 02/20/2023    9:15 AM 02/02/2023    5:00 AM   CBC  WBC 4.0 - 10.5 K/uL 2.5  2.2  2.3   Hemoglobin 12.0 - 15.0 g/dL 8.6  8.8  8.3   Hematocrit 36.0 - 46.0 % 27.4  27.7  26.3   Platelets 150 - 400 K/uL 67  70  76       Latest Ref Rng & Units 03/09/2023    4:32 AM 03/06/2023    7:11 AM 03/02/2023    7:17 AM  CMP  Glucose 70 - 99 mg/dL 409  811  914   BUN 6 - 20 mg/dL 23  25  24    Creatinine 0.44 - 1.00 mg/dL 7.82  9.56  2.13   Sodium 135 - 145 mmol/L 135  139  139   Potassium 3.5 - 5.1 mmol/L 3.4  3.7  4.0   Chloride 98 - 111 mmol/L 103  107  107   CO2 22 - 32 mmol/L 25  26  27    Calcium 8.9 - 10.3 mg/dL 7.9  8.2  8.3   Total Protein 6.5 - 8.1 g/dL 5.8  5.7  6.0   Total Bilirubin 0.3 - 1.2 mg/dL 0.7  0.5  0.5   Alkaline Phos 38 - 126 U/L 159  158  147   AST 15 - 41 U/L 50  53  50   ALT 0 - 44 U/L 34  36  35     Imaging studies: No new imaging studies this AM   Assessment/Plan:  57 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  - New 07/02: No acute changes of note in last 24 hours   - Wound Care ([name] Pouch); Now on smaller [name] pouch; continues managing effluent independently. Intermittent oozing from time to time. Change as needed.    - Continue soft diet as tolerated  - Continue cyclic TPN; weekly nutritional labs - Appreciate dietary assistance - monitoring weight; now on daily Lasix as of 05/20             - Monitor abdominal examination; on-going bowel function            - Pain control prn; antemetic prn            - Progressed with therapies; no longer any recommendations; ambulating well    - Discharge Planning: Tentative plan for definitive repair within 4 weeks; timing per Dr Everlene Farrier    All of the above findings and recommendations were discussed with the patient, and the medical team, and all of patient's questions were answered to her expressed satisfaction.  -- [name], PA-C [facility] 03/10/2023, 7:13 AM M-F: 7am - 4pm

## 2023-03-10 NOTE — Progress Notes (Signed)
Nutrition Follow-up  DOCUMENTATION CODES:   Obesity unspecified  INTERVENTION:   Continue cyclic TPN per pharmacy (16 hrs)- provides 1811kcal/day and 102g/day protein   Ensure Enlive po BID, each supplement provides 350 kcal and 20 grams of protein.  Continue MVI, chromium and trace elements in TPN   Daily weights   Continue zinc additional 10mg  daily added to TPN (increased 6/21)  Continue Selenium additional daily added to TPN (increased 6/21)  Increase Vitamin A to 20,000 units po daily   Will check routine vitamin labs every 3 months, next due 7/21  NUTRITION DIAGNOSIS:   Increased nutrient needs related to wound healing, catabolic illness as evidenced by estimated needs. -ongoing   GOAL:   Patient will meet greater than or equal to 90% of their needs -met with TPN   MONITOR:   PO intake, Supplement acceptance, Labs, Weight trends, Diet advancement, I & O's, TPN  ASSESSMENT:   57 y/o female with h/o hypothyroidism, COVID 19 (3/21), kidney stones and stage 3 colon cancer (s/p left hemicolectomy 5/21 and chemotherapy) who is admitted for new pelvic mass now s/p laparotomy 6/8 (with excision of pelvic mass from greater omentum, abdominal wall reconstruction with bilateral myocutaneous flaps and mesh, incisional hernia repair, appendectomy repair and VAC placement) complicated by bowel perforation s/p reopening of recent laparotomy 6/10 (with repair of small bowel perforation, excision of mesh, placement of two phasix mesh and VAC placement). Pathology returned as metastatic adenocarcinoma. Pt with L1 compression fracture.   Pt s/p right IJ port 4/12  Pt continues to tolerate TPN well at goal rate; TPN is currently being cycled for 16 hrs via port. Triglycerides wnl. Pt remains up >30lbs from her admit weight; PA aware. Pt continues on lasix daily. Will check routine vitamin labs every 3 months as pt on chronic TPN (next due 7/21). Vitamins being repleted as needed.  Pt continues to have poor oral intake; pt eating <25% of meals. [name] pouch with unmeasured output. Plan is for definitive repair within 4 weeks per surgery.   Medications reviewed and include: lasix, insulin, synthroid, protonix, vitamin A, TPN  -Selenium- 74(L), zinc 43(L), vitamin A 14.3(L)- 6/19 -Chromium- 2.0 wnl, copper- 109 wnl, Manganese- 17.9 wnl, vitamin D 74.99 wnl, Iodine 70 wnl, aluminum 10(H)- 4/12 -Iron 25(L), TIBC 398, folate 29.0 wnl, B12 922(H)- 4/12 -Ferritin 52- 6/19 -Vitamin B1- 73.2 wnl, B6- 8.9 wnl, vitamin C- 0.9 wnl, vitamin E- 12.6 wnl, vitamin K- 0.49 wnl- 4/12 -Carnitine 32.0 wnl- 4/12  Labs reviewed: P 3.4(L), BUN 23(H), P 3.4 wnl, Mg 2.1 wnl- 7/1 Triglycerides- 135- wnl Cbgs- 177, 141, 129, 193 x 48 hrs  Diet Order:    Diet Order             DIET SOFT Fluid consistency: Thin  Diet effective now                  EDUCATION NEEDS:   Not appropriate for education at this time  Skin:  Skin Assessment: Reviewed RN Assessment (EC fistula)  Last BM:  6/29  Height:   Ht Readings from Last 1 Encounters:  12/19/22 4\' 11"  (1.499 m)    Weight:   Wt Readings from Last 1 Encounters:  03/10/23 107 kg    Ideal Body Weight:  44.3 kg  BMI:  Body mass index is 47.64 kg/m.  Estimated Nutritional Needs:   Kcal:  1800-2100kcal/day  Protein:  90-110g/d  Fluid:  1.4-1.6L/day  Betsey Holiday MS, RD, LDN Please refer  to [facility] for RD and/or RD on-call/weekend/after hours pager

## 2023-03-10 NOTE — Progress Notes (Signed)
PHARMACY - TOTAL PARENTERAL NUTRITION CONSULT NOTE   Indication: Prolonged ileus  Patient Measurements: Height: 4\' 11"  (149.9 cm) Weight: 107 kg (235 lb 14.3 oz) IBW/kg (Calculated) : 43.2 TPN AdjBW (KG): 57.8 Body mass index is 47.64 kg/m.  Assessment: Debra [name] is a 57 y.o. female s/p laparotomy, excision of greater omental mass, abdominal wall reconstruction with [name], appendectomy, and placement of Prevena vac.  Glucose / Insulin:  --No apparent history of diabetes --mSSI 4x/day (0500, 1300, 1800, 2200) + 13u in TPN --BG last 24h: 129-141 (all checks not recorded) --SSI last 24h: 7 units Electrolytes:K + 3.4>> Kcl 20 mEq po q4h x 2 (7/1) Renal: SCr stable at baseline Hepatic: Alk Phos 147>158>159, AST 50, ALT 34, TBili Stable GI Imaging: 9/11 CTAP: no new acute issues GI Surgeries / Procedures: s/p laparotomy, excision of greater omental mass, abdominal wall reconstruction with [name], appendectomy, and placement of Prevena vac 12/19/22 placement of right IJ port-a-catheter with dual reservoirs also s/p re-opening of laparotomy for repair of small bowel perforation   Central access: 02/15/22 TPN start date: 02/15/22  Nutritional Goals: Goal cyclic TPN over 16 hrs: cyclic TPN over 16 hrs: (provides 102 g of protein and 1811 kcals per day) total volume over 16hrs for calculations=1400 ml (1500 ml total with overfill)  RD Assessment:  Estimated Needs Total Energy Estimated Needs: 1800-2100kcal/day Total Protein Estimated Needs: 90-110g/d Total Fluid Estimated Needs: 1.4-1.6L/day  Current Nutrition:  Soft diet + nutritional supplements, not meeting PO needs  Plan:  Transitioned to *Cyclic* TPN on 1/61/09.   to run over 16 hours per MD request. [name] at 2000 per discussion with dietician to allow patient time for walking in afternoon/evenings.  To run over 16 hours: -Start rate at 49 mL/hr for 1 hour. -Increase rate to 98 mL/hr for  13 hours.  -Decrease rate to 49 mL/hr for 1 hour. -Decrease rate to 25 mL/hr for 1 hour, then stop.  Plan:  See cyclic TPN rate above Nutritional Components Amino acids (using 15% Clinisol): 98 g Dextrose 19% = 266 g Lipids (using 20% SMOFlipids): 50.4 g kCal: 1817/24h  Electrolytes in TPN: Na 75 mEq/L, K 50 mEq/L, Ca 25mEq/L, Mg 10 mEq/L, Phos 10 mmol/L, Cl:Ac 1:1  trace elements, MVI, chromium, zinc (increased to 17g on 6/22) and selenium (increased to 150mg  on 6/22) per dietary recs  Continue CBG/SSI for Cyclic TPN:  -CBG 2 hrs after cyclic TPN start -CBG during middle of cyclic TPN -CBG 1 hr after cylic TPN stopped -CBG while off TPN Continue SSI insulin at moderate scale and 13u insulin in TPN Check TPN labs weekly on Mondays and more frequently if indicated  Barrie Folk, PharmD Clinical Pharmacist 03/10/2023 7:55 AM

## 2023-03-11 LAB — BASIC METABOLIC PANEL
Anion gap: 7 (ref 5–15)
BUN: 22 mg/dL — ABNORMAL HIGH (ref 6–20)
CO2: 24 mmol/L (ref 22–32)
Calcium: 7.9 mg/dL — ABNORMAL LOW (ref 8.9–10.3)
Chloride: 104 mmol/L (ref 98–111)
Creatinine, Ser: 0.44 mg/dL (ref 0.44–1.00)
GFR, Estimated: >60 mL/min (ref >60)
Glucose, Bld: 193 mg/dL — ABNORMAL HIGH (ref 70–99)
Potassium: 4 mmol/L (ref 3.5–5.1)
Sodium: 135 mmol/L (ref 135–145)

## 2023-03-11 LAB — GLUCOSE, CAPILLARY
Glucose-Capillary: 175 mg/dL — ABNORMAL HIGH (ref 70–99)
Glucose-Capillary: 151 mg/dL — ABNORMAL HIGH (ref 70–99)
Glucose-Capillary: 132 mg/dL — ABNORMAL HIGH (ref 70–99)
Glucose-Capillary: 82 mg/dL (ref 70–99)

## 2023-03-11 MED ORDER — ZINC CHLORIDE 1 MG/ML IV SOLN
INTRAVENOUS | Status: AC
  Filled 2023-03-11: qty 653.33

## 2023-03-11 NOTE — Progress Notes (Addendum)
PHARMACY - TOTAL PARENTERAL NUTRITION CONSULT NOTE   Indication: Prolonged ileus  Patient Measurements: Height: 4\' 11"  (149.9 cm) Weight: 110.4 kg (243 lb 6.2 oz) IBW/kg (Calculated) : 43.2 TPN AdjBW (KG): 57.8 Body mass index is 49.16 kg/m.  Assessment: Debra [name] is a 57 y.o. female s/p laparotomy, excision of greater omental mass, abdominal wall reconstruction with [name], appendectomy, and placement of Prevena vac.  Glucose / Insulin:  --No apparent history of diabetes --mSSI 4x/day (0500, 1300, 1800, 2200) + 13u in TPN --BG last 24h: 91-167 (all checks not recorded) --SSI last 24h: 8 units Electrolytes: WNL Renal: SCr stable at baseline Hepatic: Alk Phos 147>158>159, AST 50, ALT 34, TBili Stable GI Imaging: 9/11 CTAP: no new acute issues GI Surgeries / Procedures: s/p laparotomy, excision of greater omental mass, abdominal wall reconstruction with [name], appendectomy, and placement of Prevena vac 12/19/22 placement of right IJ port-a-catheter with dual reservoirs also s/p re-opening of laparotomy for repair of small bowel perforation   Central access: 02/15/22 TPN start date: 02/15/22  Nutritional Goals: Goal cyclic TPN over 16 hrs: cyclic TPN over 16 hrs: (provides 102 g of protein and 1811 kcals per day) total volume over 16hrs for calculations=1400 ml (1500 ml total with overfill)  RD Assessment:  Estimated Needs Total Energy Estimated Needs: 1800-2100kcal/day Total Protein Estimated Needs: 90-110g/d Total Fluid Estimated Needs: 1.4-1.6L/day  Current Nutrition:  Soft diet + nutritional supplements, not meeting PO needs  Plan:  Transitioned to *Cyclic* TPN on 1/61/09.   to run over 16 hours per MD request. [name] at 2000 per discussion with dietician to allow patient time for walking in afternoon/evenings.  To run over 16 hours: -Start rate at 49 mL/hr for 1 hour. -Increase rate to 98 mL/hr for 13 hours.  -Decrease rate to 49  mL/hr for 1 hour. -Decrease rate to 25 mL/hr for 1 hour, then stop.  Plan:  See cyclic TPN rate above Nutritional Components Amino acids (using 15% Clinisol): 98 g Dextrose 19% = 266 g Lipids (using 20% SMOFlipids): 50.4 g kCal: 1817/24h  Electrolytes in TPN: Na 75 mEq/L, K 50 mEq/L, Ca 51mEq/L, Mg 10 mEq/L, Phos 10 mmol/L, Cl:Ac 1:1  trace elements, MVI, chromium, zinc (increased to 17g on 6/22) and selenium (increased to 150mg  on 6/22) per dietary recs  Continue CBG/SSI for Cyclic TPN:  -CBG 2 hrs after cyclic TPN start -CBG during middle of cyclic TPN -CBG 1 hr after cylic TPN stopped -CBG while off TPN Continue SSI insulin at moderate scale and 13u insulin in TPN Check TPN labs weekly on Mondays and more frequently if indicated  Barrie Folk, PharmD Clinical Pharmacist 03/11/2023 7:21 AM

## 2023-03-11 NOTE — TOC Progression Note (Addendum)
Transition of Care [facility]) - Progression Note    Patient Details  Name: Debra [name] MRN: [id number] Date of Birth: Jan 28, 1966  Transition of Care [facility]) CM/SW Contact  Darleene Cleaver, [place] Phone Number: 03/11/2023, 4:05 PM  Clinical Narrative:     CSW and RN case manager spoke to [facility] leadership [employer].  There is still not a safe discharge plan for patient.  Physicians and hospital leadership are continuing to discuss a plan to provide safe discharge plan.  Due to medical complexity patient will need indefinite TPN and complex home health care needs.   Expected Discharge Plan: Home w Home Health Services Barriers to Discharge: Continued Medical Work up  Expected Discharge Plan and Services    To be determined                                           Social Determinants of Health (SDOH) Interventions SDOH Screenings   Food Insecurity: No Food Insecurity (05/20/2022)  Housing: Low Risk  (05/20/2022)  Transportation Needs: No Transportation Needs (05/20/2022)  Utilities: Not At Risk (05/20/2022)  Tobacco Use: Low Risk  (12/22/2022)    Readmission Risk Interventions     No data to display

## 2023-03-11 NOTE — Progress Notes (Signed)
[facility] SURGICAL PROGRESS NOTE   Hospital Day(s): 391.   Post op day(s): 82 Days Post-Op.   Interval History:  Patient seen and examined Nothing acute overnight Only complaint is fullness from TPN during the day No abdominal pain, nausea, emesis, fever [name] output unmeasured in last 24 hours; managing effluent independently No bleeding noted On cyclic TPN + soft diet  Ambulating well; independent  Review of Systems:  Constitutional: denies fever, chills  HEENT: denies cough or congestion  Respiratory: denies any shortness of breath  Cardiovascular: denies chest pain or palpitations  Gastrointestinal: denied abdominal pain, denied N/V Genitourinary: denies burning with urination or urinary frequency Integumentary: + midline wound (healing; stable)  Vital signs in last 24 hours: [min-max] current  Temp:  [97.8 F (36.6 C)-98.6 F (37 C)] 98.4 F (36.9 C) (07/03 0504) Pulse Rate:  [72-78] 78 (07/03 0504) Resp:  [18-20] 18 (07/03 0504) BP: (103-113)/(53-65) 104/53 (07/03 0504) SpO2:  [95 %-100 %] 99 % (07/03 0504) Weight:  [110.4 kg] 110.4 kg (07/03 0500)     Height: 4\' 11"  (149.9 cm) Weight: 110.4 kg BMI (Calculated): 46.02   Intake/Output last 2 shifts:  07/02 0701 - 07/03 0700 In: 1239.2 [P.O.:120; I.V.:1119.2] Out: -    Physical Exam:  Constitutional: alert, cooperative and no distress  Respiratory: breathing non-labored at rest  Cardiovascular: regular rate and sinus rhythm  Gastrointestinal: Soft, non-tender, non-distended, no rebound/guarding. Integumentary: Midline wound healing by secondary intention, peritoneum closed; granulating and new skin growing over majority of wound now; there are three stomatized areas visible in the LUQ and LLQ portions of the wound. No active bleeding. Piece of ST mesh noted.  Chest: Port to the right chest; no further swelling or drainage; no evidence of infection   Labs:     Latest Ref Rng & Units 03/06/2023     7:11 AM 02/20/2023    9:15 AM 02/02/2023    5:00 AM  CBC  WBC 4.0 - 10.5 K/uL 2.5  2.2  2.3   Hemoglobin 12.0 - 15.0 g/dL 8.6  8.8  8.3   Hematocrit 36.0 - 46.0 % 27.4  27.7  26.3   Platelets 150 - 400 K/uL 67  70  76       Latest Ref Rng & Units 03/09/2023    4:32 AM 03/06/2023    7:11 AM 03/02/2023    7:17 AM  CMP  Glucose 70 - 99 mg/dL 914  782  956   BUN 6 - 20 mg/dL 23  25  24    Creatinine 0.44 - 1.00 mg/dL 2.13  0.86  5.78   Sodium 135 - 145 mmol/L 135  139  139   Potassium 3.5 - 5.1 mmol/L 3.4  3.7  4.0   Chloride 98 - 111 mmol/L 103  107  107   CO2 22 - 32 mmol/L 25  26  27    Calcium 8.9 - 10.3 mg/dL 7.9  8.2  8.3   Total Protein 6.5 - 8.1 g/dL 5.8  5.7  6.0   Total Bilirubin 0.3 - 1.2 mg/dL 0.7  0.5  0.5   Alkaline Phos 38 - 126 U/L 159  158  147   AST 15 - 41 U/L 50  53  50   ALT 0 - 44 U/L 34  36  35     Imaging studies: No new imaging studies this AM   Assessment/Plan:  57 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  - New 07/03: No acute changes of note in last 24 hours   - Wound Care ([name] Pouch); Now on smaller [name] pouch; continues managing effluent independently. Intermittent oozing from time to time. Change as needed.    - Continue soft diet as tolerated  - Continue cyclic TPN; weekly nutritional labs - Appreciate dietary assistance - monitoring weight; now on daily Lasix as of 05/20             - Monitor abdominal examination; on-going bowel function            - Pain control prn; antemetic prn            - Progressed with therapies; no longer any recommendations; ambulating well    - Discharge Planning: Tentative plan for  definitive repair within 4 weeks; timing per Dr Everlene Farrier   All of the above findings and recommendations were discussed with the patient, and the medical team, and all of patient's questions were answered to her expressed satisfaction.  -- [name], PA-C [facility] 03/11/2023, 7:32 AM M-F: 7am - 4pm

## 2023-03-12 MED ORDER — ZINC CHLORIDE 1 MG/ML IV SOLN
INTRAVENOUS | Status: AC
  Filled 2023-03-12: qty 653.33

## 2023-03-12 NOTE — Progress Notes (Signed)
PHARMACY - TOTAL PARENTERAL NUTRITION CONSULT NOTE   Indication: Prolonged ileus  Patient Measurements: Height: 4\' 11"  (149.9 cm) Weight: 110.4 kg (243 lb 6.2 oz) IBW/kg (Calculated) : 43.2 TPN AdjBW (KG): 57.8 Body mass index is 49.16 kg/m.  Assessment: Debra [name] is a 57 y.o. female s/p laparotomy, excision of greater omental mass, abdominal wall reconstruction with [name], appendectomy, and placement of Prevena vac.  Glucose / Insulin:  --No apparent history of diabetes --mSSI 4x/day (0500, 1300, 1800, 2200) + 13units insulin in TPN --BG last 24h: 82-193 (all checks not recorded) --SSI last 24h: 8 units Electrolytes: WNL Renal: SCr stable at baseline Hepatic: Alk Phos 147>158>159, AST 50, ALT 34, TBili Stable GI Imaging: 9/11 CTAP: no new acute issues GI Surgeries / Procedures: s/p laparotomy, excision of greater omental mass, abdominal wall reconstruction with [name], appendectomy, and placement of Prevena vac 12/19/22 placement of right IJ port-a-catheter with dual reservoirs also s/p re-opening of laparotomy for repair of small bowel perforation   Central access: 02/15/22 TPN start date: 02/15/22  Nutritional Goals: Goal cyclic TPN over 16 hrs: cyclic TPN over 16 hrs: (provides 102 g of protein and 1811 kcals per day) total volume over 16hrs for calculations=1400 ml (1500 ml total with overfill)  RD Assessment:  Estimated Needs Total Energy Estimated Needs: 1800-2100kcal/day Total Protein Estimated Needs: 90-110g/d Total Fluid Estimated Needs: 1.4-1.6L/day  Current Nutrition:  Soft diet + nutritional supplements, not meeting PO needs  Plan:  Transitioned to *Cyclic* TPN on 1/61/09.   to run over 16 hours per MD request. [name] at 2000 per discussion with dietician to allow patient time for walking in afternoon/evenings.  To run over 16 hours: -Start rate at 49 mL/hr for 1 hour. -Increase rate to 98 mL/hr for 13 hours.   -Decrease rate to 49 mL/hr for 1 hour. -Decrease rate to 25 mL/hr for 1 hour, then stop.  Plan:  See cyclic TPN rate above Nutritional Components Amino acids (using 15% Clinisol): 98 g Dextrose 19% = 266 g Lipids (using 20% SMOFlipids): 50.4 g kCal: 1817/24h  Electrolytes in TPN: Na 75 mEq/L, K 50 mEq/L, Ca 38mEq/L, Mg 10 mEq/L, Phos 10 mmol/L, Cl:Ac 1:1  trace elements, MVI, chromium, zinc (increased to 17g on 6/22) and selenium (increased to 150mg  on 6/22) per dietary recs  Continue CBG/SSI for Cyclic TPN:  -CBG 2 hrs after cyclic TPN start -CBG during middle of cyclic TPN -CBG 1 hr after cylic TPN stopped -CBG while off TPN Continue SSI insulin at moderate scale and 13u insulin in TPN Check TPN labs weekly on Mondays and more frequently if indicated  Angelique Blonder, PharmD Clinical Pharmacist 03/12/2023 8:13 AM

## 2023-03-12 NOTE — Progress Notes (Signed)
Subjective:  CC: Debra [name] is a 57 y.o. female  Hospital stay day 392, with high output enterocutaneous fistula 81 Days Post-Op placement of right IJ port-a-catheter with dual reservoirs also s/p re-opening of laparotomy for repair of small bowel perforation following initial laparotomy, excision of greater omental mass, abdominal wall reconstruction with [name], appendectomy, and placement of Prevena vac on 06/08.   HPI: No issues reported overnight  ROS:  General: Denies weight loss, weight gain, fatigue, fevers, chills, and night sweats. Heart: Denies chest pain, palpitations, racing heart, irregular heartbeat, leg pain or swelling, and decreased activity tolerance. Respiratory: Denies breathing difficulty, shortness of breath, wheezing, cough, and sputum. GI: Denies change in appetite, heartburn, nausea, vomiting, constipation, diarrhea, and blood in stool. GU: Denies difficulty urinating, pain with urinating, urgency, frequency, blood in urine.   Objective:   Temp:  [98.4 F (36.9 C)-98.7 F (37.1 C)] 98.7 F (37.1 C) (07/04 0734) Pulse Rate:  [73-76] 76 (07/04 0734) Resp:  [16-18] 18 (07/04 0734) BP: (102-114)/(55-64) 114/55 (07/04 0734) SpO2:  [96 %-100 %] 96 % (07/04 0734)     Height: 4\' 11"  (149.9 cm) Weight: 110.4 kg BMI (Calculated): 46.02   Intake/Output this shift:   Intake/Output Summary (Last 24 hours) at 03/12/2023 0955 Last data filed at 03/12/2023 0428 Gross per 24 hour  Intake 770.36 ml  Output --  Net 770.36 ml    Constitutional :  alert, cooperative, appears stated age, and no distress  Respiratory:  clear to auscultation bilaterally  Cardiovascular:  regular rate and rhythm  Gastrointestinal: soft, non-tender; bowel sounds normal; no masses,  no organomegaly.   Skin: Cool and moist.  [name]'s pouch intact in place.  No sign of active bleeding at time of exam.  Psychiatric: Normal affect, non-agitated, not confused       LABS:      Latest Ref Rng & Units 03/11/2023    8:13 AM 03/09/2023    4:32 AM 03/06/2023    7:11 AM  CMP  Glucose 70 - 99 mg/dL 161  096  045   BUN 6 - 20 mg/dL 22  23  25    Creatinine 0.44 - 1.00 mg/dL 4.09  8.11  9.14   Sodium 135 - 145 mmol/L 135  135  139   Potassium 3.5 - 5.1 mmol/L 4.0  3.4  3.7   Chloride 98 - 111 mmol/L 104  103  107   CO2 22 - 32 mmol/L 24  25  26    Calcium 8.9 - 10.3 mg/dL 7.9  7.9  8.2   Total Protein 6.5 - 8.1 g/dL  5.8  5.7   Total Bilirubin 0.3 - 1.2 mg/dL  0.7  0.5   Alkaline Phos 38 - 126 U/L  159  158   AST 15 - 41 U/L  50  53   ALT 0 - 44 U/L  34  36       Latest Ref Rng & Units 03/06/2023    7:11 AM 02/20/2023    9:15 AM 02/02/2023    5:00 AM  CBC  WBC 4.0 - 10.5 K/uL 2.5  2.2  2.3   Hemoglobin 12.0 - 15.0 g/dL 8.6  8.8  8.3   Hematocrit 36.0 - 46.0 % 27.4  27.7  26.3   Platelets 150 - 400 K/uL 67  70  76     RADS: N/ Assessment:   high output enterocutaneous fistula 81 Days Post-Op placement of right IJ port-a-catheter with dual  reservoirs also s/p re-opening of laparotomy for repair of small bowel perforation following initial laparotomy, excision of greater omental mass, abdominal wall reconstruction with [name], appendectomy, and placement of Prevena vac on 06/08.   No change today.  Continue present management.  labs/images/medications/previous chart entries reviewed personally and relevant changes/updates noted above.

## 2023-03-13 LAB — GLUCOSE, CAPILLARY
Glucose-Capillary: 199 mg/dL — ABNORMAL HIGH (ref 70–99)
Glucose-Capillary: 179 mg/dL — ABNORMAL HIGH (ref 70–99)
Glucose-Capillary: 156 mg/dL — ABNORMAL HIGH (ref 70–99)
Glucose-Capillary: 146 mg/dL — ABNORMAL HIGH (ref 70–99)
Glucose-Capillary: 142 mg/dL — ABNORMAL HIGH (ref 70–99)
Glucose-Capillary: 174 mg/dL — ABNORMAL HIGH (ref 70–99)
Glucose-Capillary: 186 mg/dL — ABNORMAL HIGH (ref 70–99)
Glucose-Capillary: 153 mg/dL — ABNORMAL HIGH (ref 70–99)
Glucose-Capillary: 151 mg/dL — ABNORMAL HIGH (ref 70–99)
Glucose-Capillary: 89 mg/dL (ref 70–99)

## 2023-03-13 MED ORDER — ZINC CHLORIDE 1 MG/ML IV SOLN
INTRAVENOUS | Status: AC
  Filled 2023-03-13: qty 653.33

## 2023-03-13 NOTE — Progress Notes (Signed)
[facility] SURGICAL PROGRESS NOTE   Hospital Day(s): 393.   Post op day(s): 84 Days Post-Op.   Interval History:  Patient seen and examined Nothing acute overnight Still only issue is fullness from TPN No abdominal pain, nausea, emesis, fever [name] output unmeasured in last 24 hours; managing effluent independently No bleeding noted On cyclic TPN + soft diet  Ambulating well; independent  Review of Systems:  Constitutional: denies fever, chills  HEENT: denies cough or congestion  Respiratory: denies any shortness of breath  Cardiovascular: denies chest pain or palpitations  Gastrointestinal: denied abdominal pain, denied N/V Genitourinary: denies burning with urination or urinary frequency Integumentary: + midline wound (healing; stable)  Vital signs in last 24 hours: [min-max] current  Temp:  [98 F (36.7 C)-98.7 F (37.1 C)] 98.1 F (36.7 C) (07/05 0500) Pulse Rate:  [73-85] 73 (07/05 0500) Resp:  [18] 18 (07/05 0500) BP: (107-127)/(55-62) 107/56 (07/05 0500) SpO2:  [96 %-97 %] 97 % (07/05 0500) Weight:  [108.5 kg] 108.5 kg (07/05 0309)     Height: 4\' 11"  (149.9 cm) Weight: 108.5 kg BMI (Calculated): 46.02   Intake/Output last 2 shifts:  07/04 0701 - 07/05 0700 In: 728.1 [P.O.:60; I.V.:668.1] Out: -    Physical Exam:  Constitutional: alert, cooperative and no distress  Respiratory: breathing non-labored at rest  Cardiovascular: regular rate and sinus rhythm  Gastrointestinal: Soft, non-tender, non-distended, no rebound/guarding. Integumentary: Midline wound healing by secondary intention, peritoneum closed; granulating and new skin growing over majority of wound now; there are three stomatized areas visible in the LUQ and LLQ portions of the wound. No active bleeding. Piece of ST mesh noted.  Chest: Port to the right chest; no further swelling or drainage; no evidence of infection   Labs:     Latest Ref Rng & Units 03/06/2023    7:11 AM  02/20/2023    9:15 AM 02/02/2023    5:00 AM  CBC  WBC 4.0 - 10.5 K/uL 2.5  2.2  2.3   Hemoglobin 12.0 - 15.0 g/dL 8.6  8.8  8.3   Hematocrit 36.0 - 46.0 % 27.4  27.7  26.3   Platelets 150 - 400 K/uL 67  70  76       Latest Ref Rng & Units 03/11/2023    8:13 AM 03/09/2023    4:32 AM 03/06/2023    7:11 AM  CMP  Glucose 70 - 99 mg/dL 409  811  914   BUN 6 - 20 mg/dL 22  23  25    Creatinine 0.44 - 1.00 mg/dL 7.82  9.56  2.13   Sodium 135 - 145 mmol/L 135  135  139   Potassium 3.5 - 5.1 mmol/L 4.0  3.4  3.7   Chloride 98 - 111 mmol/L 104  103  107   CO2 22 - 32 mmol/L 24  25  26    Calcium 8.9 - 10.3 mg/dL 7.9  7.9  8.2   Total Protein 6.5 - 8.1 g/dL  5.8  5.7   Total Bilirubin 0.3 - 1.2 mg/dL  0.7  0.5   Alkaline Phos 38 - 126 U/L  159  158   AST 15 - 41 U/L  50  53   ALT 0 - 44 U/L  34  36     Imaging studies: No new imaging studies this AM   Assessment/Plan:  57 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  - New 07/05: Scant ooze from [name], no overt bleeding, no other acute issues. Only complaint is fullness from TPN   - Wound Care ([name] Pouch); Now on smaller [name] pouch; continues managing effluent independently. Intermittent oozing from time to time. Change as needed.    - Continue soft diet as tolerated  - Continue cyclic TPN; weekly nutritional labs - Appreciate dietary assistance - monitoring weight; now on daily Lasix as of 05/20             - Monitor abdominal examination; on-going bowel function            - Pain control prn; antemetic prn            - Progressed with therapies; no longer any recommendations; ambulating well    - Discharge  Planning: Tentative plan for definitive repair within 4 weeks; timing per Dr Everlene Farrier   All of the above findings and recommendations were discussed with the patient, and the medical team, and all of patient's questions were answered to her expressed satisfaction.  -- [name], PA-C [facility] 03/13/2023, 7:12 AM M-F: 7am - 4pm

## 2023-03-13 NOTE — Progress Notes (Signed)
PHARMACY - TOTAL PARENTERAL NUTRITION CONSULT NOTE   Indication: Prolonged ileus  Patient Measurements: Height: 4\' 11"  (149.9 cm) Weight: 108.5 kg (239 lb 3.2 oz) IBW/kg (Calculated) : 43.2 TPN AdjBW (KG): 57.8 Body mass index is 48.31 kg/m.  Assessment: Debra [name] is a 57 y.o. female s/p laparotomy, excision of greater omental mass, abdominal wall reconstruction with [name], appendectomy, and placement of Prevena vac.  Glucose / Insulin:  --No apparent history of diabetes --mSSI 4x/day (0500, 1300, 1800, 2200) + 13units insulin in TPN --BG last 24h: 142-179 --SSI last 24h: 8 units Electrolytes: WNL Renal: SCr stable at baseline Hepatic: Alk Phos 147>158>159, AST 50, ALT 34, TBili Stable GI Imaging: 9/11 CTAP: no new acute issues GI Surgeries / Procedures: s/p laparotomy, excision of greater omental mass, abdominal wall reconstruction with [name], appendectomy, and placement of Prevena vac 12/19/22 placement of right IJ port-a-catheter with dual reservoirs also s/p re-opening of laparotomy for repair of small bowel perforation   Central access: 02/15/22 TPN start date: 02/15/22  Nutritional Goals: Goal cyclic TPN over 16 hrs: cyclic TPN over 16 hrs: (provides 102 g of protein and 1811 kcals per day) total volume over 16hrs for calculations=1400 ml (1500 ml total with overfill)  RD Assessment:  Estimated Needs Total Energy Estimated Needs: 1800-2100kcal/day Total Protein Estimated Needs: 90-110g/d Total Fluid Estimated Needs: 1.4-1.6L/day  Current Nutrition:  Soft diet + nutritional supplements, not meeting PO needs  Plan:  Transitioned to *Cyclic* TPN on 12/16/79.   to run over 16 hours per MD request. [name] at 2000 per discussion with dietician to allow patient time for walking in afternoon/evenings.  To run over 16 hours: -Start rate at 49 mL/hr for 1 hour. -Increase rate to 98 mL/hr for 13 hours.  -Decrease rate to 49 mL/hr for 1  hour. -Decrease rate to 25 mL/hr for 1 hour, then stop.  Plan:  See cyclic TPN rate above Nutritional Components Amino acids (using 15% Clinisol): 98 g Dextrose 19% = 266 g Lipids (using 20% SMOFlipids): 50.4 g kCal: 1817/24h  Electrolytes in TPN: Na 75 mEq/L, K 50 mEq/L, Ca 50mEq/L, Mg 10 mEq/L, Phos 10 mmol/L, Cl:Ac 1:1  trace elements, MVI, chromium, zinc (increased to 17g on 6/22) and selenium (increased to 150mg  on 6/22) per dietary recs  Continue CBG/SSI for Cyclic TPN:  -CBG 2 hrs after cyclic TPN start -CBG during middle of cyclic TPN -CBG 1 hr after cylic TPN stopped -CBG while off TPN Continue SSI insulin at moderate scale and 13u insulin in TPN Check TPN labs weekly on Mondays and more frequently if indicated  Barrie Folk, PharmD Clinical Pharmacist 03/13/2023 7:00 AM

## 2023-03-13 NOTE — Plan of Care (Signed)
  Problem: Clinical Measurements: Goal: Ability to maintain clinical measurements within normal limits will improve Outcome: Progressing Goal: Will remain free from infection Outcome: Progressing Goal: Diagnostic test results will improve Outcome: Progressing Goal: Respiratory complications will improve Outcome: Progressing Goal: Cardiovascular complication will be avoided Outcome: Progressing   Problem: Clinical Measurements: Goal: Will remain free from infection Outcome: Progressing   Problem: Nutrition: Goal: Adequate nutrition will be maintained Outcome: Progressing   Problem: Activity: Goal: Risk for activity intolerance will decrease Outcome: Progressing   Problem: Nutrition: Goal: Adequate nutrition will be maintained Outcome: Progressing   Problem: Coping: Goal: Level of anxiety will decrease Outcome: Progressing   Problem: Pain Managment: Goal: General experience of comfort will improve Outcome: Progressing   

## 2023-03-14 MED ORDER — ZINC CHLORIDE 1 MG/ML IV SOLN
INTRAVENOUS | Status: AC
  Filled 2023-03-14: qty 653.33

## 2023-03-14 NOTE — Progress Notes (Signed)
Patient ID: Debra [name], female   DOB: 1966-07-17, 57 y.o.   MRN: [id number]     SURGICAL PROGRESS NOTE   Hospital Day(s): 394.   Interval History: Patient seen and examined, no acute events or new complaints overnight. Patient reports feeling well.  She denies any complaint.  She denies any abdominal pain.  She continue with [name] pouch.  Vital signs in last 24 hours: [min-max] current  Temp:  [98 F (36.7 C)-98.3 F (36.8 C)] 98 F (36.7 C) (07/06 0729) Pulse Rate:  [67-79] 77 (07/06 0729) Resp:  [18] 18 (07/06 0729) BP: (105-116)/(61-71) 109/62 (07/06 0729) SpO2:  [96 %-100 %] 96 % (07/06 0729) Weight:  [109.7 kg] 109.7 kg (07/06 0332)     Height: 4\' 11"  (149.9 cm) Weight: 109.7 kg BMI (Calculated): 46.02   Physical Exam:  Constitutional: alert, cooperative and no distress  Respiratory: breathing non-labored at rest  Cardiovascular: regular rate and sinus rhythm  Gastrointestinal: soft, non-tender, and non-distended fistulous opening identified.  Adequate healing around the fistulous.  Labs:     Latest Ref Rng & Units 03/06/2023    7:11 AM 02/20/2023    9:15 AM 02/02/2023    5:00 AM  CBC  WBC 4.0 - 10.5 K/uL 2.5  2.2  2.3   Hemoglobin 12.0 - 15.0 g/dL 8.6  8.8  8.3   Hematocrit 36.0 - 46.0 % 27.4  27.7  26.3   Platelets 150 - 400 K/uL 67  70  76       Latest Ref Rng & Units 03/11/2023    8:13 AM 03/09/2023    4:32 AM 03/06/2023    7:11 AM  CMP  Glucose 70 - 99 mg/dL 952  841  324   BUN 6 - 20 mg/dL 22  23  25    Creatinine 0.44 - 1.00 mg/dL 4.01  0.27  2.53   Sodium 135 - 145 mmol/L 135  135  139   Potassium 3.5 - 5.1 mmol/L 4.0  3.4  3.7   Chloride 98 - 111 mmol/L 104  103  107   CO2 22 - 32 mmol/L 24  25  26    Calcium 8.9 - 10.3 mg/dL 7.9  7.9  8.2   Total Protein 6.5 - 8.1 g/dL  5.8  5.7   Total Bilirubin 0.3 - 1.2 mg/dL  0.7  0.5   Alkaline Phos 38 - 126 U/L  159  158   AST 15 - 41 U/L  50  53   ALT 0 - 44 U/L  34  36     Imaging studies: No new pertinent  imaging studies   Assessment/Plan:  57 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 -There is no clinical laceration.  Patient continue with stable vital signs.  No sign of bleeding. -Continue current management with TPN, local care -Encourage patient to ambulate -Continue current diet  Gae Gallop, MD

## 2023-03-14 NOTE — Plan of Care (Signed)
  Problem: Clinical Measurements: Goal: Ability to maintain clinical measurements within normal limits will improve Outcome: Progressing Goal: Will remain free from infection Outcome: Progressing Goal: Diagnostic test results will improve Outcome: Progressing Goal: Respiratory complications will improve Outcome: Progressing Goal: Cardiovascular complication will be avoided Outcome: Progressing   Problem: Clinical Measurements: Goal: Will remain free from infection Outcome: Progressing   Problem: Nutrition: Goal: Adequate nutrition will be maintained Outcome: Progressing   Problem: Activity: Goal: Risk for activity intolerance will decrease Outcome: Progressing   Problem: Nutrition: Goal: Adequate nutrition will be maintained Outcome: Progressing   Problem: Coping: Goal: Level of anxiety will decrease Outcome: Progressing   Problem: Pain Managment: Goal: General experience of comfort will improve Outcome: Progressing   

## 2023-03-14 NOTE — Progress Notes (Signed)
   03/14/23 1200  Spiritual Encounters  Type of Visit Follow up  Care provided to: Pt and family  Reason for visit Routine spiritual support  OnCall Visit Yes   Chaplain provided follow-up care and support.

## 2023-03-14 NOTE — Plan of Care (Signed)

## 2023-03-14 NOTE — Progress Notes (Signed)
PHARMACY - TOTAL PARENTERAL NUTRITION CONSULT NOTE   Indication: Prolonged ileus  Patient Measurements: Height: 4\' 11"  (149.9 cm) Weight: 109.7 kg (241 lb 14.4 oz) IBW/kg (Calculated) : 43.2 TPN AdjBW (KG): 57.8 Body mass index is 48.86 kg/m.  Assessment: Debra [name] is a 57 y.o. female s/p laparotomy, excision of greater omental mass, abdominal wall reconstruction with [name], appendectomy, and placement of Prevena vac.  Glucose / Insulin:  --No apparent history of diabetes --mSSI 4x/day (0500, 1300, 1800, 2200) + 13units insulin in TPN --BG last 24h: 89-153 --SSI last 24h: 6 units Electrolytes: WNL Renal: SCr stable at baseline Hepatic: Alk Phos 147>158>159, AST 50, ALT 34, TBili Stable GI Imaging: 9/11 CTAP: no new acute issues GI Surgeries / Procedures: s/p laparotomy, excision of greater omental mass, abdominal wall reconstruction with [name], appendectomy, and placement of Prevena vac 12/19/22 placement of right IJ port-a-catheter with dual reservoirs also s/p re-opening of laparotomy for repair of small bowel perforation   Central access: 02/15/22 TPN start date: 02/15/22  Nutritional Goals: Goal cyclic TPN over 16 hrs: cyclic TPN over 16 hrs: (provides 102 g of protein and 1811 kcals per day) total volume over 16hrs for calculations=1400 ml (1500 ml total with overfill)  RD Assessment:  Estimated Needs Total Energy Estimated Needs: 1800-2100kcal/day Total Protein Estimated Needs: 90-110g/d Total Fluid Estimated Needs: 1.4-1.6L/day  Current Nutrition:  Soft diet + nutritional supplements, not meeting PO needs  Plan:  Transitioned to *Cyclic* TPN on 1/61/09.   to run over 16 hours per MD request. [name] at 2000 per discussion with dietician to allow patient time for walking in afternoon/evenings.  To run over 16 hours: -Start rate at 49 mL/hr for 1 hour. -Increase rate to 98 mL/hr for 13 hours.  -Decrease rate to 49 mL/hr for 1  hour. -Decrease rate to 25 mL/hr for 1 hour, then stop.  Plan:  See cyclic TPN rate above Nutritional Components Amino acids (using 15% Clinisol): 98 g Dextrose 19% = 266 g Lipids (using 20% SMOFlipids): 50.4 g kCal: 1817/24h  Electrolytes in TPN: Na 75 mEq/L, K 50 mEq/L, Ca 56mEq/L, Mg 10 mEq/L, Phos 10 mmol/L, Cl:Ac 1:1  trace elements, MVI, chromium, zinc (increased to 17g on 6/22) and selenium (increased to 150mg  on 6/22) per dietary recs  Continue CBG/SSI for Cyclic TPN:  -CBG 2 hrs after cyclic TPN start -CBG during middle of cyclic TPN -CBG 1 hr after cylic TPN stopped -CBG while off TPN Continue SSI insulin at moderate scale and 13u insulin in TPN Check TPN labs weekly on Mondays and more frequently if indicated  Ronnald Ramp, PharmD Clinical Pharmacist 03/14/2023 8:50 AM

## 2023-03-15 LAB — GLUCOSE, CAPILLARY
Glucose-Capillary: 167 mg/dL — ABNORMAL HIGH (ref 70–99)
Glucose-Capillary: 128 mg/dL — ABNORMAL HIGH (ref 70–99)
Glucose-Capillary: 151 mg/dL — ABNORMAL HIGH (ref 70–99)

## 2023-03-15 MED ORDER — ZINC CHLORIDE 1 MG/ML IV SOLN
INTRAVENOUS | Status: AC
  Filled 2023-03-15: qty 653.33

## 2023-03-15 NOTE — Progress Notes (Signed)
Patient ID: Debra [name], female   DOB: 02-Jan-1966, 57 y.o.   MRN: [id number]     SURGICAL PROGRESS NOTE   Hospital Day(s): 395.   Interval History: Patient seen and examined, no acute events or new complaints overnight. Patient reports feeling well.  She was found sitting down in the chair very comfortable.  No complaints.  Vital signs in last 24 hours: [min-max] current  Temp:  [98.2 F (36.8 C)-99 F (37.2 C)] 99 F (37.2 C) (07/07 0742) Pulse Rate:  [78-98] 98 (07/07 0742) Resp:  [18] 18 (07/07 0742) BP: (101-119)/(55-71) 119/64 (07/07 0742) SpO2:  [97 %-99 %] 97 % (07/07 0742)     Height: 4\' 11"  (149.9 cm) Weight: 109.7 kg BMI (Calculated): 46.02   Physical Exam:  Constitutional: alert, cooperative and no distress  Respiratory: breathing non-labored at rest  Cardiovascular: regular rate and sinus rhythm  Gastrointestinal: soft, non-tender, and non-distended.  Osteomized fistulous identified.  No bleeding.  Labs:     Latest Ref Rng & Units 03/06/2023    7:11 AM 02/20/2023    9:15 AM 02/02/2023    5:00 AM  CBC  WBC 4.0 - 10.5 K/uL 2.5  2.2  2.3   Hemoglobin 12.0 - 15.0 g/dL 8.6  8.8  8.3   Hematocrit 36.0 - 46.0 % 27.4  27.7  26.3   Platelets 150 - 400 K/uL 67  70  76       Latest Ref Rng & Units 03/11/2023    8:13 AM 03/09/2023    4:32 AM 03/06/2023    7:11 AM  CMP  Glucose 70 - 99 mg/dL 409  811  914   BUN 6 - 20 mg/dL 22  23  25    Creatinine 0.44 - 1.00 mg/dL 7.82  9.56  2.13   Sodium 135 - 145 mmol/L 135  135  139   Potassium 3.5 - 5.1 mmol/L 4.0  3.4  3.7   Chloride 98 - 111 mmol/L 104  103  107   CO2 22 - 32 mmol/L 24  25  26    Calcium 8.9 - 10.3 mg/dL 7.9  7.9  8.2   Total Protein 6.5 - 8.1 g/dL  5.8  5.7   Total Bilirubin 0.3 - 1.2 mg/dL  0.7  0.5   Alkaline Phos 38 - 126 U/L  159  158   AST 15 - 41 U/L  50  53   ALT 0 - 44 U/L  34  36     Imaging studies: No new pertinent imaging studies   Assessment/Plan:  57 y.o. female with enterocutaneous  fistula   -Fistula output is charted as 50.  I think the patient is draining the bag on her own without getting charted. -There is no clinical deterioration.  Patient continue with stable vital signs.  No sign of bleeding. -Continue current management with TPN, local care -Encourage patient to ambulate -Continue current diet  Gae Gallop, MD

## 2023-03-15 NOTE — Plan of Care (Signed)
  Problem: Clinical Measurements: Goal: Ability to maintain clinical measurements within normal limits will improve Outcome: Progressing Goal: Will remain free from infection Outcome: Progressing Goal: Diagnostic test results will improve Outcome: Progressing Goal: Respiratory complications will improve Outcome: Progressing Goal: Cardiovascular complication will be avoided Outcome: Progressing   Problem: Clinical Measurements: Goal: Will remain free from infection Outcome: Progressing   Problem: Nutrition: Goal: Adequate nutrition will be maintained Outcome: Progressing   Problem: Activity: Goal: Risk for activity intolerance will decrease Outcome: Progressing   Problem: Nutrition: Goal: Adequate nutrition will be maintained Outcome: Progressing   Problem: Coping: Goal: Level of anxiety will decrease Outcome: Progressing   Problem: Pain Managment: Goal: General experience of comfort will improve Outcome: Progressing   

## 2023-03-15 NOTE — Progress Notes (Signed)
PHARMACY - TOTAL PARENTERAL NUTRITION CONSULT NOTE   Indication: Prolonged ileus  Patient Measurements: Height: 4\' 11"  (149.9 cm) Weight: 109.7 kg (241 lb 14.4 oz) IBW/kg (Calculated) : 43.2 TPN AdjBW (KG): 57.8 Body mass index is 48.86 kg/m.  Assessment: Debra [name] is a 57 y.o. female s/p laparotomy, excision of greater omental mass, abdominal wall reconstruction with [name], appendectomy, and placement of Prevena vac.  Glucose / Insulin:  --No apparent history of diabetes --mSSI 4x/day (0500, 1300, 1800, 2200) + 13units insulin in TPN --BG last 24h: 89-153 --SSI last 24h: 6 units Electrolytes: WNL Renal: SCr stable at baseline Hepatic: Alk Phos 147>158>159, AST 50, ALT 34, TBili Stable GI Imaging: 9/11 CTAP: no new acute issues GI Surgeries / Procedures: s/p laparotomy, excision of greater omental mass, abdominal wall reconstruction with [name], appendectomy, and placement of Prevena vac 12/19/22 placement of right IJ port-a-catheter with dual reservoirs also s/p re-opening of laparotomy for repair of small bowel perforation   Central access: 02/15/22 TPN start date: 02/15/22  Nutritional Goals: Goal cyclic TPN over 16 hrs: cyclic TPN over 16 hrs: (provides 102 g of protein and 1811 kcals per day) total volume over 16hrs for calculations=1400 ml (1500 ml total with overfill)  RD Assessment:  Estimated Needs Total Energy Estimated Needs: 1800-2100kcal/day Total Protein Estimated Needs: 90-110g/d Total Fluid Estimated Needs: 1.4-1.6L/day  Current Nutrition:  Soft diet + nutritional supplements, not meeting PO needs  Plan:  Transitioned to *Cyclic* TPN on 1/61/09.   to run over 16 hours per MD request. [name] at 2000 per discussion with dietician to allow patient time for walking in afternoon/evenings.  To run over 16 hours: -Start rate at 49 mL/hr for 1 hour. -Increase rate to 98 mL/hr for 13 hours.  -Decrease rate to 49 mL/hr for 1  hour. -Decrease rate to 25 mL/hr for 1 hour, then stop.  Plan:  See cyclic TPN rate above Nutritional Components Amino acids (using 15% Clinisol): 98 g Dextrose 19% = 266 g Lipids (using 20% SMOFlipids): 50.4 g kCal: 1817/24h  Electrolytes in TPN: Na 75 mEq/L, K 50 mEq/L, Ca 19mEq/L, Mg 10 mEq/L, Phos 10 mmol/L, Cl:Ac 1:1  trace elements, MVI, chromium, zinc (increased to 17g on 6/22) and selenium (increased to 150mg  on 6/22) per dietary recs  Continue CBG/SSI for Cyclic TPN:  -CBG 2 hrs after cyclic TPN start -CBG during middle of cyclic TPN -CBG 1 hr after cylic TPN stopped -CBG while off TPN Continue SSI insulin at moderate scale and 13u insulin in TPN Check TPN labs weekly on Mondays and more frequently if indicated  Ronnald Ramp, PharmD Clinical Pharmacist 03/15/2023 8:38 AM

## 2023-03-16 LAB — GLUCOSE, CAPILLARY
Glucose-Capillary: 183 mg/dL — ABNORMAL HIGH (ref 70–99)
Glucose-Capillary: 200 mg/dL — ABNORMAL HIGH (ref 70–99)
Glucose-Capillary: 154 mg/dL — ABNORMAL HIGH (ref 70–99)
Glucose-Capillary: 126 mg/dL — ABNORMAL HIGH (ref 70–99)
Glucose-Capillary: 91 mg/dL (ref 70–99)
Glucose-Capillary: 181 mg/dL — ABNORMAL HIGH (ref 70–99)
Glucose-Capillary: 212 mg/dL — ABNORMAL HIGH (ref 70–99)
Glucose-Capillary: 188 mg/dL — ABNORMAL HIGH (ref 70–99)
Glucose-Capillary: 154 mg/dL — ABNORMAL HIGH (ref 70–99)
Glucose-Capillary: 154 mg/dL — ABNORMAL HIGH (ref 70–99)
Glucose-Capillary: 102 mg/dL — ABNORMAL HIGH (ref 70–99)

## 2023-03-16 LAB — COMPREHENSIVE METABOLIC PANEL
ALT: 35 U/L (ref 0–44)
AST: 51 U/L — ABNORMAL HIGH (ref 15–41)
Albumin: 2.4 g/dL — ABNORMAL LOW (ref 3.5–5.0)
Alkaline Phosphatase: 171 U/L — ABNORMAL HIGH (ref 38–126)
Anion gap: 5 (ref 5–15)
BUN: 21 mg/dL — ABNORMAL HIGH (ref 6–20)
CO2: 25 mmol/L (ref 22–32)
Calcium: 7.9 mg/dL — ABNORMAL LOW (ref 8.9–10.3)
Chloride: 104 mmol/L (ref 98–111)
Creatinine, Ser: 0.49 mg/dL (ref 0.44–1.00)
GFR, Estimated: >60 mL/min (ref >60)
Glucose, Bld: 219 mg/dL — ABNORMAL HIGH (ref 70–99)
Potassium: 3.8 mmol/L (ref 3.5–5.1)
Sodium: 134 mmol/L — ABNORMAL LOW (ref 135–145)
Total Bilirubin: 0.6 mg/dL (ref 0.3–1.2)
Total Protein: 5.7 g/dL — ABNORMAL LOW (ref 6.5–8.1)

## 2023-03-16 LAB — PHOSPHORUS: Phosphorus: 2.9 mg/dL (ref 2.5–4.6)

## 2023-03-16 LAB — MAGNESIUM: Magnesium: 2 mg/dL (ref 1.7–2.4)

## 2023-03-16 MED ORDER — ZINC CHLORIDE 1 MG/ML IV SOLN
INTRAVENOUS | Status: AC
  Filled 2023-03-16: qty 653.33

## 2023-03-16 MED ORDER — FUROSEMIDE 10 MG/ML IJ SOLN
20.0000 mg | Freq: Two times a day (BID) | INTRAMUSCULAR | Status: DC
  Administered 2023-03-16 – 2023-04-02 (×31): 20 mg via INTRAVENOUS
  Filled 2023-03-16 (×38): qty 4

## 2023-03-16 NOTE — Progress Notes (Signed)
PHARMACY - TOTAL PARENTERAL NUTRITION CONSULT NOTE   Indication: Prolonged ileus  Patient Measurements: Height: 4\' 11"  (149.9 cm) Weight: 110.4 kg (243 lb 6.2 oz) IBW/kg (Calculated) : 43.2 TPN AdjBW (KG): 57.8 Body mass index is 49.16 kg/m.  Assessment: Sarann Lauralyn Heart is a 57 y.o. female s/p laparotomy, excision of greater omental mass, abdominal wall reconstruction with [name], appendectomy, and placement of Prevena vac.  Glucose / Insulin:  --No apparent history of diabetes --mSSI 4x/day (0500, 1300, 1800, 2200) + 13units insulin in TPN --BG last 24h: 128 - 219 --SSI last 24h: 13 units Electrolytes: WNL Renal: SCr stable at baseline Hepatic: Alk Phos 147>171, AST 51, ALT wnl, TBili Stable GI Imaging: 9/11 CTAP: no new acute issues GI Surgeries / Procedures: s/p laparotomy, excision of greater omental mass, abdominal wall reconstruction with [name], appendectomy, and placement of Prevena vac 12/19/22 placement of right IJ port-a-catheter with dual reservoirs also s/p re-opening of laparotomy for repair of small bowel perforation   Central access: 02/15/22 TPN start date: 02/15/22  Nutritional Goals: Goal cyclic TPN over 16 hrs: cyclic TPN over 16 hrs: (provides 102 g of protein and 1811 kcals per day) total volume over 16hrs for calculations=1400 ml (1500 ml total with overfill)  RD Assessment:  Estimated Needs Total Energy Estimated Needs: 1800-2100kcal/day Total Protein Estimated Needs: 90-110g/d Total Fluid Estimated Needs: 1.4-1.6L/day  Current Nutrition:  Soft diet + nutritional supplements, not meeting PO needs  Plan:  Transitioned to *Cyclic* TPN on 7/84/69.   to run over 16 hours per MD request. [name] at 2000 per discussion with dietician to allow patient time for walking in afternoon/evenings.  To run over 16 hours: -Start rate at 49 mL/hr for 1 hour. -Increase rate to 98 mL/hr for 13 hours.  -Decrease rate to 49 mL/hr for 1  hour. -Decrease rate to 25 mL/hr for 1 hour, then stop.  Plan:  See cyclic TPN rate above Nutritional Components Amino acids (using 15% Clinisol): 98 g Dextrose 19% = 266 g Lipids (using 20% SMOFlipids): 50.4 g kCal: 1817/24h  Electrolytes in TPN: Na 75 mEq/L, K 50 mEq/L, Ca 68mEq/L, Mg 10 mEq/L, Phos 10 mmol/L, Cl:Ac 1:1  trace elements, MVI, chromium, zinc (increased to 17g on 6/22) and selenium (increased to 150mg  on 6/22) per dietary recs  Continue CBG/SSI for Cyclic TPN:  -CBG 2 hrs after cyclic TPN start -CBG during middle of cyclic TPN -CBG 1 hr after cylic TPN stopped -CBG while off TPN Continue SSI insulin at moderate scale and 13u insulin in TPN Check TPN labs weekly on Mondays and more frequently if indicated  [name], PharmD Clinical Pharmacist 03/16/2023 6:58 AM

## 2023-03-16 NOTE — Progress Notes (Signed)
[facility] SURGICAL PROGRESS NOTE   Hospital Day(s): 396.   Post op day(s): 87 Days Post-Op.   Interval History:  Patient seen and examined Nothing acute over the weekend Still only issue is fullness from TPN No abdominal pain, nausea, emesis, fever Nutritional labs reassuring  [name] output unmeasured in last 24 hours; managing effluent independently No bleeding noted On cyclic TPN + soft diet  Ambulating well; independent  Review of Systems:  Constitutional: denies fever, chills  HEENT: denies cough or congestion  Respiratory: denies any shortness of breath  Cardiovascular: denies chest pain or palpitations  Gastrointestinal: denied abdominal pain, denied N/V Genitourinary: denies burning with urination or urinary frequency Integumentary: + midline wound (healing; stable)  Vital signs in last 24 hours: [min-max] current  Temp:  [97.9 F (36.6 C)-98.1 F (36.7 C)] 98.1 F (36.7 C) (07/08 0446) Pulse Rate:  [80-90] 80 (07/08 0446) Resp:  [18] 18 (07/08 0446) BP: (106-125)/(48-59) 106/48 (07/08 0446) SpO2:  [92 %-97 %] 97 % (07/08 0446) Weight:  [110.4 kg] 110.4 kg (07/08 0500)     Height: 4\' 11"  (149.9 cm) Weight: 110.4 kg BMI (Calculated): 46.02   Intake/Output last 2 shifts:  07/07 0701 - 07/08 0700 In: 466.2 [I.V.:466.2] Out: -    Physical Exam:  Constitutional: alert, cooperative and no distress  Respiratory: breathing non-labored at rest  Cardiovascular: regular rate and sinus rhythm  Gastrointestinal: Soft, non-tender, non-distended, no rebound/guarding. Integumentary: Midline wound healing by secondary intention, peritoneum closed; granulating and new skin growing over majority of wound now; there are three stomatized areas visible in the LUQ and LLQ portions of the wound. No active bleeding. Piece of ST mesh noted.  Chest: Port to the right chest; no further swelling or drainage; no evidence of infection   Labs:     Latest Ref Rng & Units  03/06/2023    7:11 AM 02/20/2023    9:15 AM 02/02/2023    5:00 AM  CBC  WBC 4.0 - 10.5 K/uL 2.5  2.2  2.3   Hemoglobin 12.0 - 15.0 g/dL 8.6  8.8  8.3   Hematocrit 36.0 - 46.0 % 27.4  27.7  26.3   Platelets 150 - 400 K/uL 67  70  76       Latest Ref Rng & Units 03/16/2023    6:23 AM 03/11/2023    8:13 AM 03/09/2023    4:32 AM  CMP  Glucose 70 - 99 mg/dL 409  811  914   BUN 6 - 20 mg/dL 21  22  23    Creatinine 0.44 - 1.00 mg/dL 7.82  9.56  2.13   Sodium 135 - 145 mmol/L 134  135  135   Potassium 3.5 - 5.1 mmol/L 3.8  4.0  3.4   Chloride 98 - 111 mmol/L 104  104  103   CO2 22 - 32 mmol/L 25  24  25    Calcium 8.9 - 10.3 mg/dL 7.9  7.9  7.9   Total Protein 6.5 - 8.1 g/dL 5.7   5.8   Total Bilirubin 0.3 - 1.2 mg/dL 0.6   0.7   Alkaline Phos 38 - 126 U/L 171   159   AST 15 - 41 U/L 51   50   ALT 0 - 44 U/L 35   34     Imaging studies: No new imaging studies this AM   Assessment/Plan:  57 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  - New 07/08: Discussed weight concerns (up 40 lbs) with RD last week, will do BID lasix. Watch BP as it can be on softer side. Otherwise no acute issue or changes  - Wound Care ([name] Pouch); Now on smaller [name] pouch; continues managing effluent independently. Intermittent oozing from time to time. Change as needed.    - Continue soft diet as tolerated  - Continue cyclic TPN; weekly nutritional labs - Appreciate dietary assistance - monitoring weight; up 40 lbs             - Monitor abdominal examination; on-going bowel function            - Pain control prn; antemetic prn            - Progressed with therapies; no longer any  recommendations; ambulating well    - Discharge Planning: Tentative plan for definitive repair within next ~4 weeks   All of the above findings and recommendations were discussed with the patient, and the medical team, and all of patient's questions were answered to her expressed satisfaction.  -- [name], PA-C [facility] 03/16/2023, 9:08 AM M-F: 7am - 4pm

## 2023-03-16 NOTE — TOC Progression Note (Signed)
Transition of Care [facility]) - Progression Note    Patient Details  Name: Debra [name] MRN: [id number] Date of Birth: [birth year]  Transition of Care [facility]) CM/SW Contact  Darleene Cleaver, [place] Phone Number: 03/16/2023, 5:43 PM  Clinical Narrative:     Email received from [facility] regarding cost of TPN.  TOC leadership working on trying to determine safe discharge plan for patient.  Expected Discharge Plan: Home w Home Health Services Barriers to Discharge: Continued Medical Work up  Expected Discharge Plan and Services                                               Social Determinants of Health (SDOH) Interventions SDOH Screenings   Food Insecurity: No Food Insecurity (05/20/2022)  Housing: Low Risk  (05/20/2022)  Transportation Needs: No Transportation Needs (05/20/2022)  Utilities: Not At Risk (05/20/2022)  Tobacco Use: Low Risk  (12/22/2022)    Readmission Risk Interventions     No data to display

## 2023-03-16 NOTE — Progress Notes (Signed)
FSBS 188, did not flow over

## 2023-03-17 LAB — GLUCOSE, CAPILLARY
Glucose-Capillary: 182 mg/dL — ABNORMAL HIGH (ref 70–99)
Glucose-Capillary: 167 mg/dL — ABNORMAL HIGH (ref 70–99)
Glucose-Capillary: 195 mg/dL — ABNORMAL HIGH (ref 70–99)
Glucose-Capillary: 159 mg/dL — ABNORMAL HIGH (ref 70–99)

## 2023-03-17 MED ORDER — ZINC CHLORIDE 1 MG/ML IV SOLN
INTRAVENOUS | Status: AC
  Filled 2023-03-17: qty 653.33

## 2023-03-17 NOTE — Progress Notes (Signed)
[facility] SUR[facility]GRESS NOTE   Hospital Day(s): 397.   Post op day(s): 88 Days Post-Op.   Interval History:  Patient seen and examined Nothing acute overnight No abdominal pain, nausea, emesis, fever [name] output unmeasured in last 24 hours; managing effluent independently No bleeding noted On cyclic TPN + soft diet  Ambulating well; independent  Review of Systems:  Constitutional: denies fever, chills  HEENT: denies cough or congestion  Respiratory: denies any shortness of breath  Cardiovascular: denies chest pain or palpitations  Gastrointestinal: denied abdominal pain, denied N/V Genitourinary: denies burning with urination or urinary frequency Integumentary: + midline wound (healing; stable)  Vital signs in last 24 hours: [min-max] current  Temp:  [97.7 F (36.5 C)-98.3 F (36.8 C)] 98.3 F (36.8 C) (07/09 0415) Pulse Rate:  [73-80] 77 (07/09 0415) Resp:  [18] 18 (07/09 0415) BP: (95-140)/(55-82) 113/65 (07/09 0415) SpO2:  [96 %-100 %] 98 % (07/09 0415) Weight:  [111.1 kg] 111.1 kg (07/09 0416)     Height: 4\' 11"  (149.9 cm) Weight: 111.1 kg BMI (Calculated): 46.02   Intake/Output last 2 shifts:  07/08 0701 - 07/09 0700 In: 1259.7 [I.V.:1259.7] Out: -    Physical Exam:  Constitutional: alert, cooperative and no distress  Respiratory: breathing non-labored at rest  Cardiovascular: regular rate and sinus rhythm  Gastrointestinal: Soft, non-tender, non-distended, no rebound/guarding. Integumentary: Midline wound healing by secondary intention, peritoneum closed; granulating and new skin growing over majority of wound now; there are three stomatized areas visible in the LUQ and LLQ portions of the wound. No active bleeding. Piece of ST mesh noted.  Chest: Port to the right chest; no further swelling or drainage; no evidence of infection   Labs:     Latest Ref Rng & Units 03/06/2023    7:11 AM 02/20/2023    9:15 AM 02/02/2023    5:00 AM  CBC   WBC 4.0 - 10.5 K/uL 2.5  2.2  2.3   Hemoglobin 12.0 - 15.0 g/dL 8.6  8.8  8.3   Hematocrit 36.0 - 46.0 % 27.4  27.7  26.3   Platelets 150 - 400 K/uL 67  70  76       Latest Ref Rng & Units 03/16/2023    6:23 AM 03/11/2023    8:13 AM 03/09/2023    4:32 AM  CMP  Glucose 70 - 99 mg/dL 161  096  045   BUN 6 - 20 mg/dL 21  22  23    Creatinine 0.44 - 1.00 mg/dL 4.09  8.11  9.14   Sodium 135 - 145 mmol/L 134  135  135   Potassium 3.5 - 5.1 mmol/L 3.8  4.0  3.4   Chloride 98 - 111 mmol/L 104  104  103   CO2 22 - 32 mmol/L 25  24  25    Calcium 8.9 - 10.3 mg/dL 7.9  7.9  7.9   Total Protein 6.5 - 8.1 g/dL 5.7   5.8   Total Bilirubin 0.3 - 1.2 mg/dL 0.6   0.7   Alkaline Phos 38 - 126 U/L 171   159   AST 15 - 41 U/L 51   50   ALT 0 - 44 U/L 35   34     Imaging studies: No new imaging studies this AM   Assessment/Plan:  57 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  - New 07/09: No acute changes  - Wound Care ([name] Pouch); Now on smaller [name] pouch; continues managing effluent independently. Intermittent oozing from time to time. Change as needed.    - Continue soft diet as tolerated  - Continue cyclic TPN; weekly nutritional labs - Appreciate dietary assistance - monitoring weight; up 40 lbs             - Monitor abdominal examination; on-going bowel function            - Pain control prn; antemetic prn            - Progressed with therapies; no longer any recommendations; ambulating well    - Discharge Planning: Tentative plan for definitive repair within next ~4 weeks   All of the above findings and recommendations were discussed with the patient, and the  medical team, and all of patient's questions were answered to her expressed satisfaction.  -- [name], PA-C [facility] 03/17/2023, 7:18 AM M-F: 7am - 4pm

## 2023-03-17 NOTE — TOC Progression Note (Signed)
Transition of Care [facility]) - Progression Note    Patient Details  Name: Debra [name] MRN: [id number] Date of Birth: 1965/11/24  Transition of Care [facility]) CM/SW Contact  Chapman Fitch, RN Phone Number: 03/17/2023, 2:56 PM  Clinical Narrative:     Message sent to [facility] RN to determine estimated number of supplies/supplies needed/ and cost per month  List of identified supplies and cost emailed to [facility] director Wandra Mannan, and [facility] supervisor Minerva Areola    Expected Discharge Plan: Home w Home Health Services Barriers to Discharge: Continued Medical Work up  Expected Discharge Plan and Services                                               Social Determinants of Health (SDOH) Interventions SDOH Screenings   Food Insecurity: No Food Insecurity (05/20/2022)  Housing: Low Risk  (05/20/2022)  Transportation Needs: No Transportation Needs (05/20/2022)  Utilities: Not At Risk (05/20/2022)  Tobacco Use: Low Risk  (12/22/2022)    Readmission Risk Interventions     No data to display

## 2023-03-17 NOTE — Progress Notes (Signed)
PHARMACY - TOTAL PARENTERAL NUTRITION CONSULT NOTE   Indication: Prolonged ileus  Patient Measurements: Height: 4\' 11"  (149.9 cm) Weight: 111.1 kg (244 lb 14.9 oz) IBW/kg (Calculated) : 43.2 TPN AdjBW (KG): 57.8 Body mass index is 49.47 kg/m.  Assessment: Debra [name] is a 57 y.o. female s/p laparotomy, excision of greater omental mass, abdominal wall reconstruction with [name], appendectomy, and placement of Prevena vac.  Glucose / Insulin:  --No apparent history of diabetes --mSSI 4x/day (0500, 1300, 1800, 2200) + 13units insulin in TPN --BG last 24h: 102 - 182 --SSI last 24h: 9 units Electrolytes: WNL Renal: SCr stable at baseline Hepatic: Alk Phos 147>171, AST 51, ALT wnl, TBili Stable GI Imaging: 9/11 CTAP: no new acute issues GI Surgeries / Procedures: s/p laparotomy, excision of greater omental mass, abdominal wall reconstruction with [name], appendectomy, and placement of Prevena vac 12/19/22 placement of right IJ port-a-catheter with dual reservoirs also s/p re-opening of laparotomy for repair of small bowel perforation   Central access: 02/15/22 TPN start date: 02/15/22  Nutritional Goals: Goal cyclic TPN over 16 hrs: cyclic TPN over 16 hrs: (provides 102 g of protein and 1811 kcals per day) total volume over 16hrs for calculations=1400 ml (1500 ml total with overfill)  RD Assessment:  Estimated Needs Total Energy Estimated Needs: 1800-2100kcal/day Total Protein Estimated Needs: 90-110g/d Total Fluid Estimated Needs: 1.4-1.6L/day  Current Nutrition:  Soft diet + nutritional supplements, not meeting PO needs  Plan:  Transitioned to *Cyclic* TPN on 1/61/09.   to run over 16 hours per MD request. [name] at 2000 per discussion with dietician to allow patient time for walking in afternoon/evenings.  To run over 16 hours: -Start rate at 49 mL/hr for 1 hour. -Increase rate to 98 mL/hr for 13 hours.  -Decrease rate to 49 mL/hr for 1  hour. -Decrease rate to 25 mL/hr for 1 hour, then stop.  Plan:  See cyclic TPN rate above Nutritional Components Amino acids (using 15% Clinisol): 98 g Dextrose 19% = 266 g Lipids (using 20% SMOFlipids): 50.4 g kCal: 1817/24h  Electrolytes in TPN: Na 75 mEq/L, K 50 mEq/L, Ca 16mEq/L, Mg 10 mEq/L, Phos 10 mmol/L, Cl:Ac 1:1  trace elements, MVI, chromium, zinc (increased to 17g on 6/22) and selenium (increased to 150mg  on 6/22) per dietary recs  Continue CBG/SSI for Cyclic TPN:  -CBG 2 hrs after cyclic TPN start -CBG during middle of cyclic TPN -CBG 1 hr after cylic TPN stopped -CBG while off TPN Continue SSI insulin at moderate scale and 13u insulin in TPN Check TPN labs weekly on Mondays and more frequently if indicated  [name], PharmD Clinical Pharmacist 03/17/2023 6:58 AM

## 2023-03-17 NOTE — Progress Notes (Signed)
Nutrition Follow-up  DOCUMENTATION CODES:   Obesity unspecified  INTERVENTION:   Continue cyclic TPN per pharmacy (16 hrs)- provides 1811kcal/day and 102g/day protein   Ensure Enlive po BID, each supplement provides 350 kcal and 20 grams of protein.  Continue MVI, chromium and trace elements in TPN   Daily weights   Continue zinc additional 10mg  daily added to TPN (increased 6/21)  Continue Selenium additional daily added to TPN (increased 6/21)  Vitamin A 20,000 units po daily   Will check routine vitamin labs every 3 months, next due 7/21  NUTRITION DIAGNOSIS:   Increased nutrient needs related to wound healing, catabolic illness as evidenced by estimated needs. -ongoing   GOAL:   Patient will meet greater than or equal to 90% of their needs -met with TPN   MONITOR:   PO intake, Supplement acceptance, Labs, Weight trends, Diet advancement, I & O's, TPN  ASSESSMENT:   57 y/o female with h/o hypothyroidism, COVID 19 (3/21), kidney stones and stage 3 colon cancer (s/p left hemicolectomy 5/21 and chemotherapy) who is admitted for new pelvic mass now s/p laparotomy 6/8 (with excision of pelvic mass from greater omentum, abdominal wall reconstruction with bilateral myocutaneous flaps and mesh, incisional hernia repair, appendectomy repair and VAC placement) complicated by bowel perforation s/p reopening of recent laparotomy 6/10 (with repair of small bowel perforation, excision of mesh, placement of two phasix mesh and VAC placement). Pathology returned as metastatic adenocarcinoma. Pt with L1 compression fracture.   Pt s/p right IJ port 4/12  Pt continues to tolerate TPN well at goal rate; TPN is currently being cycled for 16 hrs via port. Triglycerides wnl. Pt remains up >40lbs from her admit weight; PA aware. Lasix increased to twice daily. Will check routine vitamin labs every 3 months as pt on chronic TPN (next due 7/21). Vitamins being repleted as needed. Pt  continues to have poor oral intake; pt eating ~25% of meals. [name] pouch with unmeasured output. Plan is for definitive repair within 4 weeks per surgery.   Medications reviewed and include: lasix, insulin, synthroid, protonix, vitamin A, TPN  -Selenium- 74(L), zinc 43(L), vitamin A 14.3(L)- 6/19 -Chromium- 2.0 wnl, copper- 109 wnl, Manganese- 17.9 wnl, vitamin D 74.99 wnl, Iodine 70 wnl, aluminum 10(H)- 4/12 -Iron 25(L), TIBC 398, folate 29.0 wnl, B12 922(H)- 4/12 -Ferritin 52- 6/19 -Vitamin B1- 73.2 wnl, B6- 8.9 wnl, vitamin C- 0.9 wnl, vitamin E- 12.6 wnl, vitamin K- 0.49 wnl- 4/12 -Carnitine 32.0 wnl- 4/12  Labs reviewed: Na 134(L), BUN 21(H), P 2.9 wnl, Mg 2.0 wnl- 7/8 Triglycerides- 135- wnl- 6/24 Cbgs- 167, 182, 102, 154, 188 x 48 hrs  Diet Order:    Diet Order             DIET SOFT Fluid consistency: Thin  Diet effective now                  EDUCATION NEEDS:   Not appropriate for education at this time  Skin:  Skin Assessment: Reviewed RN Assessment (EC fistula)  Last BM:  7/9- type 7  Height:   Ht Readings from Last 1 Encounters:  12/19/22 4\' 11"  (1.499 m)    Weight:   Wt Readings from Last 1 Encounters:  03/17/23 111.1 kg    Ideal Body Weight:  44.3 kg  BMI:  Body mass index is 49.47 kg/m.  Estimated Nutritional Needs:   Kcal:  1800-2100kcal/day  Protein:  90-110g/d  Fluid:  1.4-1.6L/day  Betsey Holiday MS, RD, LDN  Please refer to [facility] for RD and/or RD on-call/weekend/after hours pager

## 2023-03-18 LAB — GLUCOSE, CAPILLARY
Glucose-Capillary: 185 mg/dL — ABNORMAL HIGH (ref 70–99)
Glucose-Capillary: 201 mg/dL — ABNORMAL HIGH (ref 70–99)
Glucose-Capillary: 174 mg/dL — ABNORMAL HIGH (ref 70–99)
Glucose-Capillary: 147 mg/dL — ABNORMAL HIGH (ref 70–99)
Glucose-Capillary: 115 mg/dL — ABNORMAL HIGH (ref 70–99)
Glucose-Capillary: 135 mg/dL — ABNORMAL HIGH (ref 70–99)

## 2023-03-18 MED ORDER — ZINC CHLORIDE 1 MG/ML IV SOLN
INTRAVENOUS | Status: AC
  Filled 2023-03-18: qty 653.33

## 2023-03-18 NOTE — Plan of Care (Signed)
  Problem: Clinical Measurements: Goal: Ability to maintain clinical measurements within normal limits will improve Outcome: Progressing Goal: Will remain free from infection Outcome: Progressing Goal: Diagnostic test results will improve Outcome: Progressing Goal: Respiratory complications will improve Outcome: Progressing Goal: Cardiovascular complication will be avoided Outcome: Progressing   Problem: Clinical Measurements: Goal: Will remain free from infection Outcome: Progressing   Problem: Nutrition: Goal: Adequate nutrition will be maintained Outcome: Progressing   Problem: Activity: Goal: Risk for activity intolerance will decrease Outcome: Progressing   Problem: Nutrition: Goal: Adequate nutrition will be maintained Outcome: Progressing   Problem: Coping: Goal: Level of anxiety will decrease Outcome: Progressing   Problem: Pain Managment: Goal: General experience of comfort will improve Outcome: Progressing   

## 2023-03-18 NOTE — Progress Notes (Signed)
Port-a-cath needle changed today. IV team confirmed placement. Port drawing back blood.

## 2023-03-18 NOTE — Progress Notes (Signed)
PHARMACY - TOTAL PARENTERAL NUTRITION CONSULT NOTE   Indication: Prolonged ileus  Patient Measurements: Height: 4\' 11"  (149.9 cm) Weight: 109.9 kg (242 lb 4.6 oz) IBW/kg (Calculated) : 43.2 TPN AdjBW (KG): 57.8 Body mass index is 48.94 kg/m.  Assessment: Debra [name] is a 57 y.o. female s/p laparotomy, excision of greater omental mass, abdominal wall reconstruction with [name], appendectomy, and placement of Prevena vac.  Glucose / Insulin:  --No apparent history of diabetes --mSSI 4x/day (0500, 1300, 1800, 2200) + 13units insulin in TPN --BG last 24h: 167-195 --SSI last 24h: 12 units Electrolytes: WNL Renal: SCr stable at baseline Hepatic: Alk Phos 147>171, AST 51, ALT wnl, TBili Stable GI Imaging: 9/11 CTAP: no new acute issues GI Surgeries / Procedures: s/p laparotomy, excision of greater omental mass, abdominal wall reconstruction with [name], appendectomy, and placement of Prevena vac 12/19/22 placement of right IJ port-a-catheter with dual reservoirs also s/p re-opening of laparotomy for repair of small bowel perforation   Central access: 02/15/2022 TPN start date: 02/15/2022  Nutritional Goals: Goal cyclic TPN over 16 hrs: cyclic TPN over 16 hrs: (provides 102 g of protein and 1811 kcals per day) total volume over 16hrs for calculations=1400 ml (1500 ml total with overfill)  RD Assessment:  Estimated Needs Total Energy Estimated Needs: 1800-2100kcal/day Total Protein Estimated Needs: 90-110g/d Total Fluid Estimated Needs: 1.4-1.6L/day  Current Nutrition:  Soft diet + nutritional supplements, not meeting PO needs  Plan:  Transitioned to *Cyclic* TPN on 1/61/09.   to run over 16 hours per MD request. [name] at 2000 per discussion with dietician to allow patient time for walking in afternoon/evenings.  To run over 16 hours: -Start rate at 49 mL/hr for 1 hour. -Increase rate to 98 mL/hr for 13 hours.  -Decrease rate to 49 mL/hr for  1 hour. -Decrease rate to 25 mL/hr for 1 hour, then stop.  Plan:  See cyclic TPN rate above Nutritional Components Amino acids (using 15% Clinisol): 98 g Dextrose 19% = 266 g Lipids (using 20% SMOFlipids): 50.4 g kCal: 1817/24h  Electrolytes in TPN: Na 75 mEq/L, K 50 mEq/L, Ca 58mEq/L, Mg 10 mEq/L, Phos 10 mmol/L, Cl:Ac 1:1  trace elements, MVI, chromium, zinc (increased to 17g on 6/22) and selenium (increased to 150mg  on 6/22) per dietary recs  Continue CBG/SSI for Cyclic TPN:  -CBG 2 hrs after cyclic TPN start -CBG during middle of cyclic TPN -CBG 1 hr after cylic TPN stopped -CBG while off TPN Continue SSI insulin at moderate scale and 13u insulin in TPN Check TPN labs weekly on Mondays and more frequently if indicated  Angelique Blonder, PharmD Clinical Pharmacist 03/18/2023 7:42 AM

## 2023-03-18 NOTE — Progress Notes (Signed)
Increased amount of blood in drainage bag on abdomen. MD Pabon notified and aware. Per MD place ice pack.

## 2023-03-18 NOTE — Progress Notes (Addendum)
[facility] SURGICAL PROGRESS NOTE   Hospital Day(s): 398.   Post op day(s): 89 Days Post-Op.   Interval History:  Patient seen and examined Nothing acute overnight Did have oozing yesterday No abdominal pain, nausea, emesis, fever [name] output unmeasured in last 24 hours; managing effluent independently No bleeding noted On cyclic TPN + soft diet  Ambulating well; independent  Review of Systems:  Constitutional: denies fever, chills  HEENT: denies cough or congestion  Respiratory: denies any shortness of breath  Cardiovascular: denies chest pain or palpitations  Gastrointestinal: denied abdominal pain, denied N/V Genitourinary: denies burning with urination or urinary frequency Integumentary: + midline wound (healing; stable)  Vital signs in last 24 hours: [min-max] current  Temp:  [98 F (36.7 C)-98.7 F (37.1 C)] 98.6 F (37 C) (07/10 0419) Pulse Rate:  [77-94] 81 (07/10 0419) Resp:  [18] 18 (07/09 2010) BP: (105-139)/(68-79) 105/68 (07/10 0419) SpO2:  [88 %-99 %] 96 % (07/10 0419) Weight:  [109.9 kg] 109.9 kg (07/10 0500)     Height: 4\' 11"  (149.9 cm) Weight: 109.9 kg BMI (Calculated): 46.02   Intake/Output last 2 shifts:  07/09 0701 - 07/10 0700 In: 956.9 [P.O.:300; I.V.:656.9] Out: 50 [Stool:50]   Physical Exam:  Constitutional: alert, cooperative and no distress  Respiratory: breathing non-labored at rest  Cardiovascular: regular rate and sinus rhythm  Gastrointestinal: Soft, non-tender, non-distended, no rebound/guarding. Integumentary: Midline wound healing by secondary intention, peritoneum closed; granulating and new skin growing over majority of wound now; there are three stomatized areas visible in the LUQ and LLQ portions of the wound. No active bleeding. Piece of ST mesh noted.  Chest: Port to the right chest; no further swelling or drainage; no evidence of infection   Labs:     Latest Ref Rng & Units 03/06/2023    7:11 AM 02/20/2023     9:15 AM 02/02/2023    5:00 AM  CBC  WBC 4.0 - 10.5 K/uL 2.5  2.2  2.3   Hemoglobin 12.0 - 15.0 g/dL 8.6  8.8  8.3   Hematocrit 36.0 - 46.0 % 27.4  27.7  26.3   Platelets 150 - 400 K/uL 67  70  76       Latest Ref Rng & Units 03/16/2023    6:23 AM 03/11/2023    8:13 AM 03/09/2023    4:32 AM  CMP  Glucose 70 - 99 mg/dL 161  096  045   BUN 6 - 20 mg/dL 21  22  23    Creatinine 0.44 - 1.00 mg/dL 4.09  8.11  9.14   Sodium 135 - 145 mmol/L 134  135  135   Potassium 3.5 - 5.1 mmol/L 3.8  4.0  3.4   Chloride 98 - 111 mmol/L 104  104  103   CO2 22 - 32 mmol/L 25  24  25    Calcium 8.9 - 10.3 mg/dL 7.9  7.9  7.9   Total Protein 6.5 - 8.1 g/dL 5.7   5.8   Total Bilirubin 0.3 - 1.2 mg/dL 0.6   0.7   Alkaline Phos 38 - 126 U/L 171   159   AST 15 - 41 U/L 51   50   ALT 0 - 44 U/L 35   34     Imaging studies: No new imaging studies this AM   Assessment/Plan:  57 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  - New 07/10: No acute major change   - Wound Care ([name] Pouch); Now on smaller [name] pouch; continues managing effluent independently. Intermittent oozing from time to time. Change as needed.    - Continue soft diet as tolerated  - Continue cyclic TPN; weekly nutritional labs - Appreciate dietary assistance - monitoring weight; up 40 lbs             - Monitor abdominal examination; on-going bowel function            - Pain control prn; antemetic prn            - Progressed with therapies; no longer any recommendations; ambulating well    - Discharge Planning: Tentative plan for definitive repair on 08/01   All of the above findings and recommendations were  discussed with the patient, and the medical team, and all of patient's questions were answered to her expressed satisfaction.  -- [name], PA-C [facility] 03/18/2023, 7:20 AM M-F: 7am - 4pm

## 2023-03-19 LAB — COMPREHENSIVE METABOLIC PANEL
ALT: 34 U/L (ref 0–44)
AST: 49 U/L — ABNORMAL HIGH (ref 15–41)
Albumin: 2.4 g/dL — ABNORMAL LOW (ref 3.5–5.0)
Alkaline Phosphatase: 171 U/L — ABNORMAL HIGH (ref 38–126)
Anion gap: 6 (ref 5–15)
BUN: 23 mg/dL — ABNORMAL HIGH (ref 6–20)
CO2: 26 mmol/L (ref 22–32)
Calcium: 8 mg/dL — ABNORMAL LOW (ref 8.9–10.3)
Chloride: 104 mmol/L (ref 98–111)
Creatinine, Ser: 0.5 mg/dL (ref 0.44–1.00)
GFR, Estimated: >60 mL/min (ref >60)
Glucose, Bld: 211 mg/dL — ABNORMAL HIGH (ref 70–99)
Potassium: 3.5 mmol/L (ref 3.5–5.1)
Sodium: 136 mmol/L (ref 135–145)
Total Bilirubin: 0.5 mg/dL (ref 0.3–1.2)
Total Protein: 5.5 g/dL — ABNORMAL LOW (ref 6.5–8.1)

## 2023-03-19 LAB — CBC
HCT: 22.9 % — ABNORMAL LOW (ref 36.0–46.0)
Hemoglobin: 7.4 g/dL — ABNORMAL LOW (ref 12.0–15.0)
MCH: 28.4 pg (ref 26.0–34.0)
MCHC: 32.3 g/dL (ref 30.0–36.0)
MCV: 87.7 fL (ref 80.0–100.0)
Platelets: 69 10*3/uL — ABNORMAL LOW (ref 150–400)
RBC: 2.61 MIL/uL — ABNORMAL LOW (ref 3.87–5.11)
RDW: 17.5 % — ABNORMAL HIGH (ref 11.5–15.5)
WBC: 2.6 10*3/uL — ABNORMAL LOW (ref 4.0–10.5)
nRBC: 0 % (ref 0.0–0.2)

## 2023-03-19 LAB — PREALBUMIN: Prealbumin: 7 mg/dL — ABNORMAL LOW (ref 18–38)

## 2023-03-19 LAB — GLUCOSE, CAPILLARY
Glucose-Capillary: 214 mg/dL — ABNORMAL HIGH (ref 70–99)
Glucose-Capillary: 204 mg/dL — ABNORMAL HIGH (ref 70–99)
Glucose-Capillary: 172 mg/dL — ABNORMAL HIGH (ref 70–99)
Glucose-Capillary: 129 mg/dL — ABNORMAL HIGH (ref 70–99)
Glucose-Capillary: 217 mg/dL — ABNORMAL HIGH (ref 70–99)

## 2023-03-19 LAB — PROTIME-INR
INR: 1.3 — ABNORMAL HIGH (ref 0.8–1.2)
Prothrombin Time: 16.5 seconds — ABNORMAL HIGH (ref 11.4–15.2)

## 2023-03-19 LAB — MAGNESIUM: Magnesium: 2.1 mg/dL (ref 1.7–2.4)

## 2023-03-19 LAB — PHOSPHORUS: Phosphorus: 3.1 mg/dL (ref 2.5–4.6)

## 2023-03-19 MED ORDER — ZINC CHLORIDE 1 MG/ML IV SOLN
INTRAVENOUS | Status: AC
  Filled 2023-03-19: qty 653.33

## 2023-03-19 NOTE — Progress Notes (Signed)
[facility] SURGICAL PROGRESS NOTE   Hospital Day(s): 399.   Post op day(s): 90 Days Post-Op.   Interval History:  Patient seen and examined Nothing acute overnight Increase in oozing last 2-3 days; nothing this morning at time of my examination No abdominal pain, nausea, emesis, fever [name] output unmeasured in last 24 hours; managing effluent independently No bleeding noted On cyclic TPN + soft diet  Ambulating well; independent  Review of Systems:  Constitutional: denies fever, chills  HEENT: denies cough or congestion  Respiratory: denies any shortness of breath  Cardiovascular: denies chest pain or palpitations  Gastrointestinal: denied abdominal pain, denied N/V Genitourinary: denies burning with urination or urinary frequency Integumentary: + midline wound (healing; stable)  Vital signs in last 24 hours: [min-max] current  Temp:  [98 F (36.7 C)-98.2 F (36.8 C)] 98 F (36.7 C) (07/11 0414) Pulse Rate:  [79-84] 83 (07/11 0414) Resp:  [16-18] 18 (07/11 0414) BP: (94-117)/(50-67) 101/53 (07/11 0414) SpO2:  [96 %-100 %] 96 % (07/11 0414) Weight:  [108.3 kg] 108.3 kg (07/11 0500)     Height: 4\' 11"  (149.9 cm) Weight: 108.3 kg BMI (Calculated): 46.02   Intake/Output last 2 shifts:  07/10 0701 - 07/11 0700 In: 1634.6 [P.O.:480; I.V.:1154.6] Out: 101 [Urine:1]   Physical Exam:  Constitutional: alert, cooperative and no distress  Respiratory: breathing non-labored at rest  Cardiovascular: regular rate and sinus rhythm  Gastrointestinal: Soft, non-tender, non-distended, no rebound/guarding. Integumentary: Midline wound healing by secondary intention, peritoneum closed; granulating and new skin growing over majority of wound now; there are three stomatized areas visible in the LUQ and LLQ portions of the wound. No active bleeding. Piece of ST mesh noted.  Chest: Port to the right chest; no further swelling or drainage; no evidence of infection   Labs:      Latest Ref Rng & Units 03/06/2023    7:11 AM 02/20/2023    9:15 AM 02/02/2023    5:00 AM  CBC  WBC 4.0 - 10.5 K/uL 2.5  2.2  2.3   Hemoglobin 12.0 - 15.0 g/dL 8.6  8.8  8.3   Hematocrit 36.0 - 46.0 % 27.4  27.7  26.3   Platelets 150 - 400 K/uL 67  70  76       Latest Ref Rng & Units 03/16/2023    6:23 AM 03/11/2023    8:13 AM 03/09/2023    4:32 AM  CMP  Glucose 70 - 99 mg/dL 161  096  045   BUN 6 - 20 mg/dL 21  22  23    Creatinine 0.44 - 1.00 mg/dL 4.09  8.11  9.14   Sodium 135 - 145 mmol/L 134  135  135   Potassium 3.5 - 5.1 mmol/L 3.8  4.0  3.4   Chloride 98 - 111 mmol/L 104  104  103   CO2 22 - 32 mmol/L 25  24  25    Calcium 8.9 - 10.3 mg/dL 7.9  7.9  7.9   Total Protein 6.5 - 8.1 g/dL 5.7   5.8   Total Bilirubin 0.3 - 1.2 mg/dL 0.6   0.7   Alkaline Phos 38 - 126 U/L 171   159   AST 15 - 41 U/L 51   50   ALT 0 - 44 U/L 35   34     Imaging studies: No new imaging studies this AM   Assessment/Plan:  57 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  - New 07/11: Noted oozing episodes in last 24-48 hours; frequent pouch changes. No oozing this morning at time of my examination (0800); otherwise doing well. No acute changes. Plan for definitive repair on 08/01. Will likely repeat CT in next 1-2 weeks for operative planning.   - Wound Care ([name] Pouch); Now on smaller [name] pouch; continues managing effluent independently. Intermittent oozing from time to time. Change as needed.    - Continue soft diet as tolerated  - Continue cyclic TPN; weekly nutritional labs - Appreciate dietary assistance - monitoring weight; up 40 lbs             - Monitor  abdominal examination; on-going bowel function            - Pain control prn; antemetic prn            - Progressed with therapies; no longer any recommendations; ambulating well    - Discharge Planning: Plan for definitive repair on 08/01   All of the above findings and recommendations were discussed with the patient, and the medical team, and all of patient's questions were answered to her expressed satisfaction.  -- [name], PA-C [facility] 03/19/2023, 7:34 AM M-F: 7am - 4pm

## 2023-03-19 NOTE — Progress Notes (Signed)
PHARMACY - TOTAL PARENTERAL NUTRITION CONSULT NOTE   Indication: Prolonged ileus  Patient Measurements: Height: 4\' 11"  (149.9 cm) Weight: 108.3 kg (238 lb 12.1 oz) IBW/kg (Calculated) : 43.2 TPN AdjBW (KG): 57.8 Body mass index is 48.22 kg/m.  Assessment: Debra [name] is a 57 y.o. female s/p laparotomy, excision of greater omental mass, abdominal wall reconstruction with [name], appendectomy, and placement of Prevena vac.  Glucose / Insulin:  --No apparent history of diabetes --mSSI 4x/day (0500, 1300, 1800, 2200) + 13units insulin in TPN --BG last 24h: 115 - 214 --SSI last 24h: 9 units Electrolytes: WNL Renal: SCr stable at baseline Hepatic: Alk Phos 147>171, AST 51, ALT wnl, TBili Stable GI Imaging: 9/11 CTAP: no new acute issues GI Surgeries / Procedures: s/p laparotomy, excision of greater omental mass, abdominal wall reconstruction with [name], appendectomy, and placement of Prevena vac 12/19/22 placement of right IJ port-a-catheter with dual reservoirs also s/p re-opening of laparotomy for repair of small bowel perforation   Central access: 02/15/2022 TPN start date: 02/15/2022  Nutritional Goals: Goal cyclic TPN over 16 hrs: cyclic TPN over 16 hrs: (provides 102 g of protein and 1811 kcals per day) total volume over 16hrs for calculations=1400 ml (1500 ml total with overfill)  RD Assessment:  Estimated Needs Total Energy Estimated Needs: 1800-2100kcal/day Total Protein Estimated Needs: 90-110g/d Total Fluid Estimated Needs: 1.4-1.6L/day  Current Nutrition:  Soft diet + nutritional supplements, not meeting PO needs  Plan:  Transitioned to *Cyclic* TPN on 1/61/09.   to run over 16 hours per MD request. [name] at 2000 per discussion with dietician to allow patient time for walking in afternoon/evenings.  To run over 16 hours: -Start rate at 49 mL/hr for 1 hour. -Increase rate to 98 mL/hr for 13 hours.  -Decrease rate to 49 mL/hr  for 1 hour. -Decrease rate to 25 mL/hr for 1 hour, then stop.  Plan:  See cyclic TPN rate above Nutritional Components Amino acids (using 15% Clinisol): 98 g Dextrose 19% = 266 g Lipids (using 20% SMOFlipids): 50.4 g kCal: 1817/24h  Electrolytes in TPN: Na 75 mEq/L, K 50 mEq/L, Ca 68mEq/L, Mg 10 mEq/L, Phos 10 mmol/L, Cl:Ac 1:1  trace elements, MVI, chromium, zinc (increased to 17g on 6/22) and selenium (increased to 150mg  on 6/22) per dietary recs  Continue CBG/SSI for Cyclic TPN:  -CBG 2 hrs after cyclic TPN start -CBG during middle of cyclic TPN -CBG 1 hr after cylic TPN stopped -CBG while off TPN Continue SSI insulin at moderate scale and 13u insulin in TPN Check TPN labs weekly on Mondays and more frequently if indicated  [name], PharmD Clinical Pharmacist 03/19/2023 7:59 AM

## 2023-03-19 NOTE — Plan of Care (Signed)
  Problem: Clinical Measurements: Goal: Ability to maintain clinical measurements within normal limits will improve Outcome: Progressing Goal: Will remain free from infection Outcome: Progressing Goal: Respiratory complications will improve Outcome: Progressing Goal: Cardiovascular complication will be avoided Outcome: Progressing   Problem: Clinical Measurements: Goal: Will remain free from infection Outcome: Progressing   Problem: Nutrition: Goal: Adequate nutrition will be maintained Outcome: Progressing   Problem: Activity: Goal: Risk for activity intolerance will decrease Outcome: Progressing   Problem: Nutrition: Goal: Adequate nutrition will be maintained Outcome: Progressing   Problem: Coping: Goal: Level of anxiety will decrease Outcome: Progressing   Problem: Pain Managment: Goal: General experience of comfort will improve Outcome: Progressing

## 2023-03-20 LAB — GLUCOSE, CAPILLARY
Glucose-Capillary: 201 mg/dL — ABNORMAL HIGH (ref 70–99)
Glucose-Capillary: 180 mg/dL — ABNORMAL HIGH (ref 70–99)
Glucose-Capillary: 132 mg/dL — ABNORMAL HIGH (ref 70–99)

## 2023-03-20 LAB — BPAM RBC: Unit Type and Rh: 9500

## 2023-03-20 LAB — CBC
HCT: 22.1 % — ABNORMAL LOW (ref 36.0–46.0)
Hemoglobin: 7.1 g/dL — ABNORMAL LOW (ref 12.0–15.0)
MCH: 28.5 pg (ref 26.0–34.0)
MCHC: 32.1 g/dL (ref 30.0–36.0)
MCV: 88.8 fL (ref 80.0–100.0)
Platelets: 70 10*3/uL — ABNORMAL LOW (ref 150–400)
RBC: 2.49 MIL/uL — ABNORMAL LOW (ref 3.87–5.11)
RDW: 17.5 % — ABNORMAL HIGH (ref 11.5–15.5)
WBC: 2.5 10*3/uL — ABNORMAL LOW (ref 4.0–10.5)
nRBC: 0 % (ref 0.0–0.2)

## 2023-03-20 LAB — PREPARE RBC (CROSSMATCH)

## 2023-03-20 LAB — TYPE AND SCREEN
ABO/RH(D): O POS
Antibody Screen: NEGATIVE

## 2023-03-20 MED ORDER — ZINC CHLORIDE 1 MG/ML IV SOLN
INTRAVENOUS | Status: AC
  Filled 2023-03-20: qty 653.33

## 2023-03-20 MED ORDER — SODIUM CHLORIDE 0.9% IV SOLUTION
Freq: Once | INTRAVENOUS | Status: AC
  Administered 2023-03-20: 1000 mL via INTRAVENOUS

## 2023-03-20 NOTE — Progress Notes (Signed)
Spoke Debra [name] [phone] from interpreter services this morning to communicate with patient. All questions were answered and patient was given the opportunity to ask me questions as well. Denies pain.

## 2023-03-20 NOTE — Progress Notes (Signed)
[facility] SURGICAL PROGRESS NOTE   Hospital Day(s): 400.   Post op day(s): 91 Days Post-Op.   Interval History:  Patient seen and examined Nothing acute overnight; now in room 226  Noted oozing intermittent this week  No abdominal pain, nausea, emesis, fever Hgb 7.1 this AM [name] output unmeasured in last 24 hours; managing effluent independently No bleeding noted On cyclic TPN + soft diet  Ambulating well; independent  Review of Systems:  Constitutional: denies fever, chills  HEENT: denies cough or congestion  Respiratory: denies any shortness of breath  Cardiovascular: denies chest pain or palpitations  Gastrointestinal: denied abdominal pain, denied N/V Genitourinary: denies burning with urination or urinary frequency Integumentary: + midline wound (healing; stable)  Vital signs in last 24 hours: [min-max] current  Temp:  [98 F (36.7 C)-98.7 F (37.1 C)] 98.7 F (37.1 C) (07/12 0524) Pulse Rate:  [82-98] 82 (07/12 0524) Resp:  [18-20] 18 (07/12 0524) BP: (105-141)/(57-77) 105/57 (07/12 0524) SpO2:  [95 %-100 %] 96 % (07/12 0524)     Height: 4\' 11"  (149.9 cm) Weight: 108.3 kg BMI (Calculated): 46.02   Intake/Output last 2 shifts:  07/11 0701 - 07/12 0700 In: 689.2 [I.V.:689.2] Out: 125 [Stool:125]   Physical Exam:  Constitutional: alert, cooperative and no distress  Respiratory: breathing non-labored at rest  Cardiovascular: regular rate and sinus rhythm  Gastrointestinal: Soft, non-tender, non-distended, no rebound/guarding. Integumentary: Midline wound healing by secondary intention, peritoneum closed; granulating and new skin growing over majority of wound now; there are three stomatized areas visible in the LUQ and LLQ portions of the wound. No active bleeding. Piece of ST mesh noted.  Chest: Port to the right chest; no further swelling or drainage; no evidence of infection   Labs:     Latest Ref Rng & Units 03/20/2023    7:03 AM  03/19/2023    8:06 AM 03/06/2023    7:11 AM  CBC  WBC 4.0 - 10.5 K/uL 2.5  2.6  2.5   Hemoglobin 12.0 - 15.0 g/dL 7.1  7.4  8.6   Hematocrit 36.0 - 46.0 % 22.1  22.9  27.4   Platelets 150 - 400 K/uL 70  69  67       Latest Ref Rng & Units 03/19/2023    8:06 AM 03/16/2023    6:23 AM 03/11/2023    8:13 AM  CMP  Glucose 70 - 99 mg/dL 161  096  045   BUN 6 - 20 mg/dL 23  21  22    Creatinine 0.44 - 1.00 mg/dL 4.09  8.11  9.14   Sodium 135 - 145 mmol/L 136  134  135   Potassium 3.5 - 5.1 mmol/L 3.5  3.8  4.0   Chloride 98 - 111 mmol/L 104  104  104   CO2 22 - 32 mmol/L 26  25  24    Calcium 8.9 - 10.3 mg/dL 8.0  7.9  7.9   Total Protein 6.5 - 8.1 g/dL 5.5  5.7    Total Bilirubin 0.3 - 1.2 mg/dL 0.5  0.6    Alkaline Phos 38 - 126 U/L 171  171    AST 15 - 41 U/L 49  51    ALT 0 - 44 U/L 34  35      Imaging studies: No new imaging studies this AM   Assessment/Plan:  57 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  - New 07/12: Noted Hgb to 7.1 this AM with oozing intermittently this week. Will give 1 unit pRBCs. She has needed transfusion from time to time this admission. Again, plan for definitive repair on 08/01. Will likely repeat CT in next 1-2 weeks for operative planning.   - Wound Care ([name] Pouch); Now on smaller [name] pouch; continues managing effluent independently. Intermittent oozing from time to time. Change as needed.    - Continue soft diet as tolerated  - Continue cyclic TPN; weekly nutritional labs - Appreciate dietary assistance - monitoring weight; up 40 lbs             - Monitor abdominal examination; on-going bowel function            - Pain  control prn; antemetic prn            - Progressed with therapies; no longer any recommendations; ambulating well    - Discharge Planning: Plan for definitive repair on 08/01   All of the above findings and recommendations were discussed with the patient, and the medical team, and all of patient's questions were answered to her expressed satisfaction.  -- [name], PA-C [facility] 03/20/2023, 7:35 AM M-F: 7am - 4pm

## 2023-03-20 NOTE — Progress Notes (Signed)
PHARMACY - TOTAL PARENTERAL NUTRITION CONSULT NOTE   Indication: Prolonged ileus  Patient Measurements: Height: 4\' 11"  (149.9 cm) Weight: 108.3 kg (238 lb 12.1 oz) IBW/kg (Calculated) : 43.2 TPN AdjBW (KG): 57.8 Body mass index is 48.22 kg/m.  Assessment: Debra [name] is a 57 y.o. female s/p laparotomy, excision of greater omental mass, abdominal wall reconstruction with [name], appendectomy, and placement of Prevena vac.  Glucose / Insulin:  --No apparent history of diabetes --mSSI 4x/day (0500, 1300, 1800, 2200) + 13units insulin in TPN --BG last 24h: 129 - 217 --SSI last 24h: 15 units Electrolytes: WNL Renal: SCr stable at baseline Hepatic: Alk Phos 147>171, AST 51, ALT wnl, TBili Stable GI Imaging: 9/11 CTAP: no new acute issues GI Surgeries / Procedures: s/p laparotomy, excision of greater omental mass, abdominal wall reconstruction with [name], appendectomy, and placement of Prevena vac 12/19/22 placement of right IJ port-a-catheter with dual reservoirs also s/p re-opening of laparotomy for repair of small bowel perforation   Central access: 02/15/2022 TPN start date: 02/15/2022  Nutritional Goals: Goal cyclic TPN over 16 hrs: cyclic TPN over 16 hrs: (provides 102 g of protein and 1811 kcals per day) total volume over 16hrs for calculations=1400 ml (1500 ml total with overfill)  RD Assessment:  Estimated Needs Total Energy Estimated Needs: 1800-2100kcal/day Total Protein Estimated Needs: 90-110g/d Total Fluid Estimated Needs: 1.4-1.6L/day  Current Nutrition:  Soft diet + nutritional supplements, not meeting PO needs  Plan:  Transitioned to *Cyclic* TPN on 1/61/09.   to run over 16 hours per MD request. [name] at 2000 per discussion with dietician to allow patient time for walking in afternoon/evenings.  To run over 16 hours: -Start rate at 49 mL/hr for 1 hour. -Increase rate to 98 mL/hr for 13 hours.  -Decrease rate to 49 mL/hr  for 1 hour. -Decrease rate to 25 mL/hr for 1 hour, then stop.  Plan:  See cyclic TPN rate above Nutritional Components Amino acids (using 15% Clinisol): 98 g Dextrose 19% = 266 g Lipids (using 20% SMOFlipids): 50.4 g kCal: 1817/24h  Electrolytes in TPN: Na 75 mEq/L, K 50 mEq/L, Ca 59mEq/L, Mg 10 mEq/L, Phos 10 mmol/L, Cl:Ac 1:1  trace elements, MVI, chromium, zinc (increased to 17g on 6/22) and selenium (increased to 150mg  on 6/22) per dietary recs  Continue CBG/SSI for Cyclic TPN:  -CBG 2 hrs after cyclic TPN start -CBG during middle of cyclic TPN -CBG 1 hr after cylic TPN stopped -CBG while off TPN Continue SSI insulin at moderate scale and 13u insulin in TPN Check TPN labs weekly on Mondays and more frequently if indicated  [name], PharmD Clinical Pharmacist 03/20/2023 6:59 AM

## 2023-03-20 NOTE — Plan of Care (Signed)
  Problem: Clinical Measurements: Goal: Ability to maintain clinical measurements within normal limits will improve Outcome: Progressing Goal: Will remain free from infection Outcome: Progressing Goal: Diagnostic test results will improve Outcome: Progressing Goal: Respiratory complications will improve Outcome: Progressing Goal: Cardiovascular complication will be avoided Outcome: Progressing   Problem: Clinical Measurements: Goal: Will remain free from infection Outcome: Progressing   Problem: Nutrition: Goal: Adequate nutrition will be maintained Outcome: Progressing   Problem: Activity: Goal: Risk for activity intolerance will decrease Outcome: Progressing   Problem: Nutrition: Goal: Adequate nutrition will be maintained Outcome: Progressing   Problem: Coping: Goal: Level of anxiety will decrease Outcome: Progressing

## 2023-03-21 LAB — BPAM RBC
Blood Product Expiration Date: 202407252359
ISSUE DATE / TIME: 202407121449

## 2023-03-21 LAB — CBC
HCT: 29.1 % — ABNORMAL LOW (ref 36.0–46.0)
Hemoglobin: 9.7 g/dL — ABNORMAL LOW (ref 12.0–15.0)
MCH: 29 pg (ref 26.0–34.0)
MCHC: 33.3 g/dL (ref 30.0–36.0)
MCV: 86.9 fL (ref 80.0–100.0)
Platelets: 81 10*3/uL — ABNORMAL LOW (ref 150–400)
RBC: 3.35 MIL/uL — ABNORMAL LOW (ref 3.87–5.11)
RDW: 17 % — ABNORMAL HIGH (ref 11.5–15.5)
WBC: 4.9 10*3/uL (ref 4.0–10.5)
nRBC: 0 % (ref 0.0–0.2)

## 2023-03-21 LAB — TYPE AND SCREEN: Unit division: 0

## 2023-03-21 MED ORDER — ZINC CHLORIDE 1 MG/ML IV SOLN
INTRAVENOUS | Status: AC
  Filled 2023-03-21: qty 725

## 2023-03-21 NOTE — Progress Notes (Signed)
PHARMACY - TOTAL PARENTERAL NUTRITION CONSULT NOTE   Indication: Prolonged ileus  Patient Measurements: Height: 4\' 11"  (149.9 cm) Weight: 107 kg (235 lb 14.3 oz) IBW/kg (Calculated) : 43.2 TPN AdjBW (KG): 57.8 Body mass index is 47.64 kg/m.  Assessment: Debra [name] is a 57 y.o. female s/p laparotomy, excision of greater omental mass, abdominal wall reconstruction with [name], appendectomy, and placement of Prevena vac.  Glucose / Insulin:  --No apparent history of diabetes --mSSI 4x/day (0500, 1300, 1800, 2200) + 13 units insulin in TPN --BG last 24h: 132-201 --SSI last 24h: 7 units Electrolytes: WNL Renal: SCr stable at baseline Hepatic: Alk Phos 147>171, AST 51, ALT wnl, TBili Stable GI Imaging: 9/11 CTAP: no new acute issues GI Surgeries / Procedures: s/p laparotomy, excision of greater omental mass, abdominal wall reconstruction with [name], appendectomy, and placement of Prevena vac 12/19/22 placement of right IJ port-a-catheter with dual reservoirs also s/p re-opening of laparotomy for repair of small bowel perforation   Central access: 02/15/2022 TPN start date: 02/15/2022  Nutritional Goals: Goal cyclic TPN over 16 hrs: cyclic TPN over 16 hrs: (provides 102 g of protein and 1811 kcals per day) total volume over 16hrs for calculations=1400 ml (1500 ml total with overfill)  RD Assessment:  Estimated Needs Total Energy Estimated Needs: 1800-2100kcal/day Total Protein Estimated Needs: 90-110g/d Total Fluid Estimated Needs: 1.4-1.6L/day  Current Nutrition:  Soft diet + nutritional supplements, not meeting PO needs  Plan:  Transitioned to *Cyclic* TPN on 1/61/09.   to run over 16 hours per MD request. [name] at 2000 per discussion with dietician to allow patient time for walking in afternoon/evenings.  To run over 16 hours: -Start rate at 49 mL/hr for 1 hour. -Increase rate to 98 mL/hr for 13 hours.  -Decrease rate to 49 mL/hr for 1  hour. -Decrease rate to 25 mL/hr for 1 hour, then stop.  Plan:  See cyclic TPN rate above Nutritional Components Amino acids (using 15% Clinisol): 108.75 g Dextrose 17% = 246 g  Lipids (using 20% SMOFlipids): 52.2 g kCal: 1795/24h  -7/13: Adjusted Dextrose from 19% to 17% and Amino acids from 98g to 108g  and total volume from to  to see if helps with blood sugars Electrolytes in TPN: Na 75 mEq/L, K 50 mEq/L, Ca 18mEq/L, Mg 10 mEq/L, Phos 10 mmol/L, Cl:Ac 1:1  trace elements, MVI, chromium, zinc (increased to 17g on 6/22) and selenium (increased to 150mg  on 6/22) per dietary recs  Continue CBG/SSI for Cyclic TPN:  -CBG 2 hrs after cyclic TPN start -CBG during middle of cyclic TPN -CBG 1 hr after cylic TPN stopped -CBG while off TPN Continue SSI insulin at moderate scale and 13u insulin in TPN Check TPN labs weekly on Mondays and more frequently if indicated  Angelique Blonder, PharmD Clinical Pharmacist 03/21/2023 8:30 AM

## 2023-03-21 NOTE — Progress Notes (Signed)
[facility] SURGICAL PROGRESS NOTE   Hospital Day(s): 401.   Post op day(s): 92 Days Post-Op.   Interval History:  Patient seen and examined Nothing acute overnight Oozing recently; nothing this morning No abdominal pain, nausea, emesis, fever [name] output unmeasured in last 24 hours; managing effluent independently On cyclic TPN + soft diet  Ambulating well; independent   Vital signs in last 24 hours: [min-max] current  Temp:  [97.9 F (36.6 C)-98.6 F (37 C)] 98.2 F (36.8 C) (07/13 0805) Pulse Rate:  [74-79] 75 (07/13 0805) Resp:  [16-20] 16 (07/13 0805) BP: (94-112)/(51-75) 105/56 (07/13 0805) SpO2:  [90 %-100 %] 90 % (07/13 0805) Weight:  [161 kg] 107 kg (07/13 0500)     Height: 4\' 11"  (149.9 cm) Weight: 107 kg BMI (Calculated): 46.02   Intake/Output last 2 shifts:  07/12 0701 - 07/13 0700 In: 1176.4 [I.V.:854.8; Blood:321.7] Out: 120 [Stool:120]   Physical Exam:  Constitutional: alert, cooperative and no distress  Respiratory: breathing non-labored at rest  Cardiovascular: regular rate and sinus rhythm  Gastrointestinal: Soft, non-tender, non-distended, no rebound/guarding. Integumentary: Midline wound healing by secondary intention, peritoneum closed; granulating and new skin growing over majority of wound now; there are three stomatized areas visible in the LUQ and LLQ portions of the wound. No active bleeding. Piece of ST mesh noted.  Chest: Port to the right chest; no further swelling or drainage; no evidence of infection   Labs:     Latest Ref Rng & Units 03/20/2023    7:03 AM 03/19/2023    8:06 AM 03/06/2023    7:11 AM  CBC  WBC 4.0 - 10.5 K/uL 2.5  2.6  2.5   Hemoglobin 12.0 - 15.0 g/dL 7.1  7.4  8.6   Hematocrit 36.0 - 46.0 % 22.1  22.9  27.4   Platelets 150 - 400 K/uL 70  69  67       Latest Ref Rng & Units 03/19/2023    8:06 AM 03/16/2023    6:23 AM 03/11/2023    8:13 AM  CMP  Glucose 70 - 99 mg/dL 096  045  409   BUN 6 - 20 mg/dL  23  21  22    Creatinine 0.44 - 1.00 mg/dL 8.11  9.14  7.82   Sodium 135 - 145 mmol/L 136  134  135   Potassium 3.5 - 5.1 mmol/L 3.5  3.8  4.0   Chloride 98 - 111 mmol/L 104  104  104   CO2 22 - 32 mmol/L 26  25  24    Calcium 8.9 - 10.3 mg/dL 8.0  7.9  7.9   Total Protein 6.5 - 8.1 g/dL 5.5  5.7    Total Bilirubin 0.3 - 1.2 mg/dL 0.5  0.6    Alkaline Phos 38 - 126 U/L 171  171    AST 15 - 41 U/L 49  51    ALT 0 - 44 U/L 34  35      Imaging studies: No new imaging studies this AM   Assessment/Plan:  57 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  Also history of re-opening of laparotomy for repair of small bowel perforation following initial laparotomy, excision of greater omental mass, abdominal wall reconstruction with [name], appendectomy, and placement of Prevena vac on 02/13/2022.  - New 07/13:  No oozing this morning otherwise doing well. No acute changes.   Plan for  definitive repair on 08/01. Will likely repeat CT in next 1-2 weeks for operative planning.   - Wound Care ([name] Pouch); Now on smaller [name] pouch; continues managing effluent independently. Intermittent oozing from time to time. Change as needed.    - Continue soft diet as tolerated  - Continue cyclic TPN; weekly nutritional labs - Appreciate dietary assistance - monitoring weight; up 40 lbs             - Monitor abdominal examination; on-going bowel function            - Pain control prn; antemetic prn            - Progressed with therapies; no longer any recommendations; ambulating well    - Discharge Planning: Plan for definitive repair on 08/01   All of the above findings and recommendations were discussed with the patient, and the medical team, and all of patient's questions were answered to her expressed satisfaction.  Campbell Lerner, M.D., [facility]  03/21/2023 ; 8:38 AM

## 2023-03-22 LAB — GLUCOSE, CAPILLARY
Glucose-Capillary: 170 mg/dL — ABNORMAL HIGH (ref 70–99)
Glucose-Capillary: 137 mg/dL — ABNORMAL HIGH (ref 70–99)

## 2023-03-22 MED ORDER — ZINC CHLORIDE 1 MG/ML IV SOLN
INTRAVENOUS | Status: AC
  Filled 2023-03-22: qty 725

## 2023-03-22 NOTE — Progress Notes (Signed)
PHARMACY - TOTAL PARENTERAL NUTRITION CONSULT NOTE   Indication: Prolonged ileus  Patient Measurements: Height: 4\' 11"  (149.9 cm) Weight: 107 kg (235 lb 14.3 oz) IBW/kg (Calculated) : 43.2 TPN AdjBW (KG): 57.8 Body mass index is 47.64 kg/m.  Assessment: Debra [name] is a 56 y.o. female s/p laparotomy, excision of greater omental mass, abdominal wall reconstruction with [name], appendectomy, and placement of Prevena vac.  Glucose / Insulin:  --No apparent history of diabetes --mSSI 4x/day (0500, 1300, 1800, 2200) + 13 units insulin in TPN --BG last 24h: 132-201 --SSI last 24h: 9 units Electrolytes: WNL Renal: SCr stable at baseline Hepatic: Alk Phos 147>171, AST 51, ALT wnl, TBili Stable GI Imaging: 9/11 CTAP: no new acute issues GI Surgeries / Procedures: s/p laparotomy, excision of greater omental mass, abdominal wall reconstruction with [name], appendectomy, and placement of Prevena vac 12/19/22 placement of right IJ port-a-catheter with dual reservoirs also s/p re-opening of laparotomy for repair of small bowel perforation   Central access: 02/15/2022 TPN start date: 02/15/2022  Nutritional Goals: Goal cyclic TPN over 16 hrs: cyclic TPN over 16 hrs: (provides 102 g of protein and 1811 kcals per day) total volume over 16hrs for calculations=1400 ml (1500 ml total with overfill)  RD Assessment:  Estimated Needs Total Energy Estimated Needs: 1800-2100kcal/day Total Protein Estimated Needs: 90-110g/d Total Fluid Estimated Needs: 1.4-1.6L/day  Current Nutrition:  Soft diet + nutritional supplements, not meeting PO needs  Plan:  Transitioned to *Cyclic* TPN on 9/60/45.   to run over 16 hours per MD request. [name] at 2000 per discussion with dietician to allow patient time for walking in afternoon/evenings.  To run over 16 hours: -Start rate at 49 mL/hr for 1 hour. -Increase rate to 98 mL/hr for 13 hours.  -Decrease rate to 49 mL/hr for 1  hour. -Decrease rate to 25 mL/hr for 1 hour, then stop.  Plan:  See cyclic TPN rate above Nutritional Components Amino acids (using 15% Clinisol): 108.75 g Dextrose 17% = 246 g  Lipids (using 20% SMOFlipids): 52.2 g kCal: 1795/24h  -7/13: Adjusted Dextrose from 19% to 17% and Amino acids from 98g to 108g  and total volume from to  to see if helps with blood sugars Electrolytes in TPN: Na 75 mEq/L, K 50 mEq/L, Ca 77mEq/L, Mg 10 mEq/L, Phos 10 mmol/L, Cl:Ac 1:1  trace elements, MVI, chromium, zinc (increased to 17g on 6/22) and selenium (increased to 150mg  on 6/22) per dietary recs  Continue CBG/SSI for Cyclic TPN:  -CBG 2 hrs after cyclic TPN start -CBG during middle of cyclic TPN -CBG 1 hr after cylic TPN stopped -CBG while off TPN Continue SSI insulin at moderate scale and 13u insulin in TPN Check TPN labs weekly on Mondays and more frequently if indicated  Angelique Blonder, PharmD Clinical Pharmacist 03/22/2023 8:59 AM

## 2023-03-22 NOTE — Progress Notes (Signed)
[facility] SURGICAL PROGRESS NOTE   Hospital Day(s): 402.   Post op day(s): 93 Days Post-Op.   Interval History:  Patient seen and examined Nothing acute overnight No bleeding this morning No abdominal pain, nausea, emesis, fever [name] output unmeasured in last 24 hours; managing effluent independently On cyclic TPN + soft diet  Ambulating well; independent   Vital signs in last 24 hours: [min-max] current  Temp:  [97.2 F (36.2 C)-98.8 F (37.1 C)] 98.4 F (36.9 C) (07/14 0829) Pulse Rate:  [75-97] 88 (07/14 0829) Resp:  [16-20] 18 (07/14 0829) BP: (99-112)/(56-73) 108/56 (07/14 0829) SpO2:  [97 %-99 %] 99 % (07/14 0829)     Height: 4\' 11"  (149.9 cm) Weight: 107 kg BMI (Calculated): 46.02   Intake/Output last 2 shifts:  07/13 0701 - 07/14 0700 In: 663 [P.O.:240; I.V.:423] Out: 1 [Urine:1]   Physical Exam:  Constitutional: alert, cooperative and no distress  Respiratory: breathing non-labored at rest  Cardiovascular: regular rate and sinus rhythm  Gastrointestinal: Soft, non-tender, non-distended, no rebound/guarding. Integumentary: Midline wound healing by secondary intention, peritoneum closed; granulating and new skin growing over majority of wound now; there are three stomatized areas visible in the LUQ and LLQ portions of the wound. No active bleeding. Piece of ST mesh noted.  Chest: Port to the right chest; no further swelling or drainage; no evidence of infection   Labs:     Latest Ref Rng & Units 03/21/2023    6:27 PM 03/20/2023    7:03 AM 03/19/2023    8:06 AM  CBC  WBC 4.0 - 10.5 K/uL 4.9  2.5  2.6   Hemoglobin 12.0 - 15.0 g/dL 9.7  7.1  7.4   Hematocrit 36.0 - 46.0 % 29.1  22.1  22.9   Platelets 150 - 400 K/uL 81  70  69       Latest Ref Rng & Units 03/19/2023    8:06 AM 03/16/2023    6:23 AM 03/11/2023    8:13 AM  CMP  Glucose 70 - 99 mg/dL 161  096  045   BUN 6 - 20 mg/dL 23  21  22    Creatinine 0.44 - 1.00 mg/dL 4.09  8.11  9.14    Sodium 135 - 145 mmol/L 136  134  135   Potassium 3.5 - 5.1 mmol/L 3.5  3.8  4.0   Chloride 98 - 111 mmol/L 104  104  104   CO2 22 - 32 mmol/L 26  25  24    Calcium 8.9 - 10.3 mg/dL 8.0  7.9  7.9   Total Protein 6.5 - 8.1 g/dL 5.5  5.7    Total Bilirubin 0.3 - 1.2 mg/dL 0.5  0.6    Alkaline Phos 38 - 126 U/L 171  171    AST 15 - 41 U/L 49  51    ALT 0 - 44 U/L 34  35      Imaging studies: No new imaging studies this AM   Assessment/Plan:  57 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  Also history of re-opening of laparotomy for repair of small bowel perforation following initial laparotomy, excision of greater omental mass, abdominal wall reconstruction with [name], appendectomy, and placement of Prevena vac on 02/13/2022.  - New 07/14:  doing well. No changes.   Plan for definitive repair on 08/01. Will likely repeat CT in next 1-2 weeks for operative planning.   -  Wound Care ([name] Pouch); Now on smaller [name] pouch; continues managing effluent independently. Intermittent oozing from time to time. Change as needed.    - Continue soft diet as tolerated  - Continue cyclic TPN; weekly nutritional labs - Appreciate dietary assistance - monitoring weight; up 40 lbs             - Monitor abdominal examination; on-going bowel function            - Pain control prn; antemetic prn            - Progressed with therapies; no longer any recommendations; ambulating well    - Discharge Planning: Plan for definitive repair on 08/01   All of the above findings and recommendations were discussed with the patient, and the medical team, and all of patient's questions were answered to her expressed satisfaction.  Campbell Lerner, M.D., [facility]  03/22/2023 ; 11:54 AM

## 2023-03-23 LAB — COMPREHENSIVE METABOLIC PANEL
ALT: 37 U/L (ref 0–44)
AST: 53 U/L — ABNORMAL HIGH (ref 15–41)
Albumin: 2.2 g/dL — ABNORMAL LOW (ref 3.5–5.0)
Alkaline Phosphatase: 162 U/L — ABNORMAL HIGH (ref 38–126)
Anion gap: 5 (ref 5–15)
BUN: 24 mg/dL — ABNORMAL HIGH (ref 6–20)
CO2: 26 mmol/L (ref 22–32)
Calcium: 7.9 mg/dL — ABNORMAL LOW (ref 8.9–10.3)
Chloride: 105 mmol/L (ref 98–111)
Creatinine, Ser: 0.54 mg/dL (ref 0.44–1.00)
GFR, Estimated: >60 mL/min (ref >60)
Glucose, Bld: 208 mg/dL — ABNORMAL HIGH (ref 70–99)
Potassium: 3.7 mmol/L (ref 3.5–5.1)
Sodium: 136 mmol/L (ref 135–145)
Total Bilirubin: 0.7 mg/dL (ref 0.3–1.2)
Total Protein: 5.8 g/dL — ABNORMAL LOW (ref 6.5–8.1)

## 2023-03-23 LAB — GLUCOSE, CAPILLARY
Glucose-Capillary: 224 mg/dL — ABNORMAL HIGH (ref 70–99)
Glucose-Capillary: 215 mg/dL — ABNORMAL HIGH (ref 70–99)
Glucose-Capillary: 151 mg/dL — ABNORMAL HIGH (ref 70–99)
Glucose-Capillary: 148 mg/dL — ABNORMAL HIGH (ref 70–99)
Glucose-Capillary: 258 mg/dL — ABNORMAL HIGH (ref 70–99)
Glucose-Capillary: 248 mg/dL — ABNORMAL HIGH (ref 70–99)
Glucose-Capillary: 154 mg/dL — ABNORMAL HIGH (ref 70–99)
Glucose-Capillary: 204 mg/dL — ABNORMAL HIGH (ref 70–99)
Glucose-Capillary: 160 mg/dL — ABNORMAL HIGH (ref 70–99)
Glucose-Capillary: 136 mg/dL — ABNORMAL HIGH (ref 70–99)
Glucose-Capillary: 189 mg/dL — ABNORMAL HIGH (ref 70–99)

## 2023-03-23 LAB — PHOSPHORUS: Phosphorus: 3 mg/dL (ref 2.5–4.6)

## 2023-03-23 LAB — MAGNESIUM: Magnesium: 2.3 mg/dL (ref 1.7–2.4)

## 2023-03-23 MED ORDER — ZINC CHLORIDE 1 MG/ML IV SOLN
INTRAVENOUS | Status: AC
  Filled 2023-03-23: qty 725

## 2023-03-23 NOTE — Plan of Care (Signed)
  Problem: Clinical Measurements: Goal: Ability to maintain clinical measurements within normal limits will improve Outcome: Progressing Goal: Will remain free from infection Outcome: Progressing Goal: Diagnostic test results will improve Outcome: Progressing Goal: Respiratory complications will improve Outcome: Progressing Goal: Cardiovascular complication will be avoided Outcome: Progressing   Problem: Clinical Measurements: Goal: Will remain free from infection Outcome: Progressing   Problem: Nutrition: Goal: Adequate nutrition will be maintained Outcome: Progressing   Problem: Activity: Goal: Risk for activity intolerance will decrease Outcome: Progressing   Problem: Nutrition: Goal: Adequate nutrition will be maintained Outcome: Progressing   Problem: Coping: Goal: Level of anxiety will decrease Outcome: Progressing   Problem: Pain Managment: Goal: General experience of comfort will improve Outcome: Progressing   

## 2023-03-23 NOTE — Progress Notes (Signed)
PHARMACY - TOTAL PARENTERAL NUTRITION CONSULT NOTE   Indication: Prolonged ileus  Patient Measurements: Height: 4\' 11"  (149.9 cm) Weight: 108.7 kg (239 lb 10.2 oz) IBW/kg (Calculated) : 43.2 TPN AdjBW (KG): 57.8 Body mass index is 48.4 kg/m.  Assessment: Debra [name] is a 57 y.o. female s/p laparotomy, excision of greater omental mass, abdominal wall reconstruction with [name], appendectomy, and placement of Prevena vac.  Glucose / Insulin:  --No apparent history of diabetes --mSSI 4x/day (0500, 1300, 1800, 2200) + 13 units insulin in TPN --BG last 24h: 137 - 204 --SSI last 24h: 13 units Electrolytes: WNL Renal: SCr stable at baseline Hepatic: Alk Phos 147->162, AST 53, ALT wnl, TBili Stable wnl GI Imaging: 9/11 CTAP: no new acute issues GI Surgeries / Procedures: s/p laparotomy, excision of greater omental mass, abdominal wall reconstruction with [name], appendectomy, and placement of Prevena vac 12/19/22 placement of right IJ port-a-catheter with dual reservoirs also s/p re-opening of laparotomy for repair of small bowel perforation   Central access: 02/15/2022 TPN start date: 02/15/2022  Nutritional Goals: Goal cyclic TPN over 16 hrs: cyclic TPN over 16 hrs: (provides 102 g of protein and 1811 kcals per day) total volume over 16hrs for calculations=1400 ml (1500 ml total with overfill)  RD Assessment:  Estimated Needs Total Energy Estimated Needs: 1800-2100kcal/day Total Protein Estimated Needs: 90-110g/d Total Fluid Estimated Needs: 1.4-1.6L/day  Current Nutrition:  Soft diet + nutritional supplements, not meeting PO needs  Plan:  Transitioned to *Cyclic* TPN on 1/61/09.   to run over 16 hours per MD request. [name] at 2000 per discussion with dietician to allow patient time for walking in afternoon/evenings.  To run over 16 hours: -Start rate at 49 mL/hr for 1 hour. -Increase rate to 98 mL/hr for 13 hours.  -Decrease rate to 49  mL/hr for 1 hour. -Decrease rate to 25 mL/hr for 1 hour, then stop.  Plan:  See cyclic TPN rate above Nutritional Components Amino acids (using 15% Clinisol): 108.75 g Dextrose 17% = 246 g  Lipids (using 20% SMOFlipids): 52.2 g kCal: 1795/24h  -7/13: Adjusted Dextrose from 19% to 17% and Amino acids from 98g to 108g  and total volume from to  to see if helps with blood sugars Electrolytes in TPN: Na 75 mEq/L, K 50 mEq/L, Ca 48mEq/L, Mg 10 mEq/L, Phos 10 mmol/L, Cl:Ac 1:1  trace elements, MVI, chromium, zinc (increased to 17g on 6/22) and selenium (increased to 150mg  on 6/22) per dietary recs  Continue CBG/SSI for Cyclic TPN:  -CBG 2 hrs after cyclic TPN start -CBG during middle of cyclic TPN -CBG 1 hr after cylic TPN stopped -CBG while off TPN Continue SSI insulin at moderate scale and 13u insulin in TPN Check TPN labs weekly on Mondays and more frequently if indicated  Debra [name], PharmD Clinical Pharmacist 03/23/2023 7:12 AM

## 2023-03-23 NOTE — Progress Notes (Signed)
[facility] SURGICAL PROGRESS NOTE   Hospital Day(s): 403.   Post op day(s): 94 Days Post-Op.   Interval History:  Patient seen and examined Nothing acute overnight No abdominal pain, nausea, emesis, fever Nutritional labs reassuring  [name] output unmeasured in last 24 hours; managing effluent independently No bleeding noted On cyclic TPN + soft diet  Ambulating well; independent  Review of Systems:  Constitutional: denies fever, chills  HEENT: denies cough or congestion  Respiratory: denies any shortness of breath  Cardiovascular: denies chest pain or palpitations  Gastrointestinal: denied abdominal pain, denied N/V Genitourinary: denies burning with urination or urinary frequency Integumentary: + midline wound (healing; stable)  Vital signs in last 24 hours: [min-max] current  Temp:  [98.2 F (36.8 C)-98.6 F (37 C)] 98.6 F (37 C) (07/15 0519) Pulse Rate:  [74-88] 76 (07/15 0519) Resp:  [18-20] 18 (07/15 0519) BP: (99-136)/(56-59) 99/56 (07/15 0519) SpO2:  [95 %-100 %] 95 % (07/15 0519) Weight:  [108.7 kg] 108.7 kg (07/15 0333)     Height: 4\' 11"  (149.9 cm) Weight: 108.7 kg BMI (Calculated): 46.02   Intake/Output last 2 shifts:  07/14 0701 - 07/15 0700 In: 1358.8 [I.V.:1358.8] Out: -    Physical Exam:  Constitutional: alert, cooperative and no distress  Respiratory: breathing non-labored at rest  Cardiovascular: regular rate and sinus rhythm  Gastrointestinal: Soft, non-tender, non-distended, no rebound/guarding. Integumentary: Midline wound healing by secondary intention, peritoneum closed; granulating and new skin growing over majority of wound now; there are three stomatized areas visible in the LUQ and LLQ portions of the wound. No active bleeding. Piece of ST mesh noted.  Chest: Port to the right chest; no further swelling or drainage; no evidence of infection   Labs:     Latest Ref Rng & Units 03/21/2023    6:27 PM 03/20/2023    7:03 AM  03/19/2023    8:06 AM  CBC  WBC 4.0 - 10.5 K/uL 4.9  2.5  2.6   Hemoglobin 12.0 - 15.0 g/dL 9.7  7.1  7.4   Hematocrit 36.0 - 46.0 % 29.1  22.1  22.9   Platelets 150 - 400 K/uL 81  70  69       Latest Ref Rng & Units 03/23/2023    7:09 AM 03/19/2023    8:06 AM 03/16/2023    6:23 AM  CMP  Glucose 70 - 99 mg/dL 166  063  016   BUN 6 - 20 mg/dL 24  23  21    Creatinine 0.44 - 1.00 mg/dL 0.10  9.32  3.55   Sodium 135 - 145 mmol/L 136  136  134   Potassium 3.5 - 5.1 mmol/L 3.7  3.5  3.8   Chloride 98 - 111 mmol/L 105  104  104   CO2 22 - 32 mmol/L 26  26  25    Calcium 8.9 - 10.3 mg/dL 7.9  8.0  7.9   Total Protein 6.5 - 8.1 g/dL 5.8  5.5  5.7   Total Bilirubin 0.3 - 1.2 mg/dL 0.7  0.5  0.6   Alkaline Phos 38 - 126 U/L 162  171  171   AST 15 - 41 U/L 53  49  51   ALT 0 - 44 U/L 37  34  35     Imaging studies: No new imaging studies this AM   Assessment/Plan:  57 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  - New 07/15: Nothing acute; Will likely repeat CT next week for operative planning.   - Wound Care ([name] Pouch); Now on smaller [name] pouch; continues managing effluent independently. Intermittent oozing from time to time. Change as needed.    - Continue soft diet as tolerated  - Continue cyclic TPN; weekly nutritional labs - Appreciate dietary assistance - monitoring weight; up 40 lbs             - Monitor abdominal examination; on-going bowel function            - Pain control prn; antemetic prn            - Progressed with therapies; no longer any recommendations; ambulating well    - Discharge Planning: Plan for definitive repair on 08/01   All of  the above findings and recommendations were discussed with the patient, and the medical team, and all of patient's questions were answered to her expressed satisfaction.  -- [name], PA-C [facility] 03/23/2023, 8:17 AM M-F: 7am - 4pm

## 2023-03-24 LAB — GLUCOSE, CAPILLARY
Glucose-Capillary: 199 mg/dL — ABNORMAL HIGH (ref 70–99)
Glucose-Capillary: 142 mg/dL — ABNORMAL HIGH (ref 70–99)
Glucose-Capillary: 142 mg/dL — ABNORMAL HIGH (ref 70–99)
Glucose-Capillary: 168 mg/dL — ABNORMAL HIGH (ref 70–99)

## 2023-03-24 MED ORDER — ZINC CHLORIDE 1 MG/ML IV SOLN
INTRAVENOUS | Status: AC
  Filled 2023-03-24: qty 725

## 2023-03-24 NOTE — Progress Notes (Signed)
Nutrition Follow-up  DOCUMENTATION CODES:   Obesity unspecified  INTERVENTION:   Continue cyclic TPN per pharmacy (16 hrs)- provides 1811kcal/day and 102g/day protein   Ensure Enlive po BID, each supplement provides 350 kcal and 20 grams of protein.  Continue MVI, chromium and trace elements in TPN   Daily weights   Continue zinc additional 10mg  daily added to TPN (increased 6/21)  Continue Selenium additional daily added to TPN (increased 6/21)  Vitamin A 20,000 units po daily   Will check routine vitamin labs every 3 months, next due 7/22  NUTRITION DIAGNOSIS:   Increased nutrient needs related to wound healing, catabolic illness as evidenced by estimated needs. -ongoing   GOAL:   Patient will meet greater than or equal to 90% of their needs -met with TPN   MONITOR:   PO intake, Supplement acceptance, Labs, Weight trends, Diet advancement, I & O's, TPN  ASSESSMENT:   57 y/o female with h/o hypothyroidism, COVID 19 (3/21), kidney stones and stage 3 colon cancer (s/p left hemicolectomy 5/21 and chemotherapy) who is admitted for new pelvic mass now s/p laparotomy 6/8 (with excision of pelvic mass from greater omentum, abdominal wall reconstruction with bilateral myocutaneous flaps and mesh, incisional hernia repair, appendectomy repair and VAC placement) complicated by bowel perforation s/p reopening of recent laparotomy 6/10 (with repair of small bowel perforation, excision of mesh, placement of two phasix mesh and VAC placement). Pathology returned as metastatic adenocarcinoma. Pt with L1 compression fracture.   Pt s/p right IJ port 4/12  Pt continues to tolerate TPN well at goal rate; TPN is currently being cycled for 16 hrs via port. Triglycerides wnl. Pt remains up >40lbs from her admit weight; PA aware. Lasix increased to twice daily. Will check routine vitamin labs every 3 months as pt on chronic TPN (next due 7/22). Vitamins being repleted as needed. Pt  continues to have poor oral intake; pt eating ~25% of meals. [name] pouch with unmeasured output. Plan is for definitive repair on 8/1.  Medications reviewed and include: lasix, insulin, synthroid, protonix, vitamin A, TPN  -Selenium- 74(L), zinc 43(L), vitamin A 14.3(L)- 6/19 -Chromium- 2.0 wnl, copper- 109 wnl, Manganese- 17.9 wnl, vitamin D 74.99 wnl, Iodine 70 wnl, aluminum 10(H)- 4/12 -Iron 25(L), TIBC 398, folate 29.0 wnl, B12 922(H)- 4/12 -Ferritin 52- 6/19 -Vitamin B1- 73.2 wnl, B6- 8.9 wnl, vitamin C- 0.9 wnl, vitamin E- 12.6 wnl, vitamin K- 0.49 wnl- 4/12 -Carnitine 32.0 wnl- 4/12  Labs reviewed: Na 136 wnl, BUN 24(H), P 3.0 wnl, Mg 2.3 wnl- 7/15 Triglycerides- 135- wnl- 6/24 Hgb 9.7(L), Hct 29.1(L)- 7/13 Cbgs- 199, 189, 136, 160, 204 x 48 hrs  Diet Order:    Diet Order             DIET SOFT Fluid consistency: Thin  Diet effective now                  EDUCATION NEEDS:   Not appropriate for education at this time  Skin:  Skin Assessment: Reviewed RN Assessment (EC fistula)  Last BM:  7/15  Height:   Ht Readings from Last 1 Encounters:  12/19/22 4\' 11"  (1.499 m)    Weight:   Wt Readings from Last 1 Encounters:  03/24/23 108.4 kg    Ideal Body Weight:  44.3 kg  BMI:  Body mass index is 48.27 kg/m.  Estimated Nutritional Needs:   Kcal:  1800-2100kcal/day  Protein:  90-110g/d  Fluid:  1.4-1.6L/day  Betsey Holiday MS, RD, LDN  Please refer to [facility] for RD and/or RD on-call/weekend/after hours pager

## 2023-03-24 NOTE — Progress Notes (Signed)
PHARMACY - TOTAL PARENTERAL NUTRITION CONSULT NOTE   Indication: Prolonged ileus  Patient Measurements: Height: 4\' 11"  (149.9 cm) Weight: 108.7 kg (239 lb 10.2 oz) IBW/kg (Calculated) : 43.2 TPN AdjBW (KG): 57.8 Body mass index is 48.4 kg/m.  Assessment: Debra [name] is a 57 y.o. female s/p laparotomy, excision of greater omental mass, abdominal wall reconstruction with [name], appendectomy, and placement of Prevena vac.  Glucose / Insulin:  --No apparent history of diabetes --mSSI 4x/day (0500, 1300, 1800, 2200) + 13 units insulin in TPN --BG last 24h: 136 - 208 --SSI last 24h: 11 units Electrolytes: WNL Renal: SCr stable at baseline Hepatic: Alk Phos 147->162, AST 53, ALT wnl, TBili Stable wnl GI Imaging: 9/11 CTAP: no new acute issues GI Surgeries / Procedures: s/p laparotomy, excision of greater omental mass, abdominal wall reconstruction with [name], appendectomy, and placement of Prevena vac 12/19/22 placement of right IJ port-a-catheter with dual reservoirs also s/p re-opening of laparotomy for repair of small bowel perforation   Central access: 02/15/2022 TPN start date: 02/15/2022  Nutritional Goals: Goal cyclic TPN over 16 hrs: cyclic TPN over 16 hrs: (provides 102 g of protein and 1811 kcals per day) total volume over 16hrs for calculations=1400 ml (1500 ml total with overfill)  RD Assessment:  Estimated Needs Total Energy Estimated Needs: 1800-2100kcal/day Total Protein Estimated Needs: 90-110g/d Total Fluid Estimated Needs: 1.4-1.6L/day  Current Nutrition:  Soft diet + nutritional supplements, not meeting PO needs  Plan:  Transitioned to *Cyclic* TPN on 2/59/56.   to run over 16 hours per MD request. [name] at 2000 per discussion with dietician to allow patient time for walking in afternoon/evenings.  To run over 16 hours: -Start rate at 49 mL/hr for 1 hour. -Increase rate to 98 mL/hr for 13 hours.  -Decrease rate to 49  mL/hr for 1 hour. -Decrease rate to 25 mL/hr for 1 hour, then stop.  Plan:  See cyclic TPN rate above Nutritional Components Amino acids (using 15% Clinisol): 108.75 g Dextrose 17% = 246 g  Lipids (using 20% SMOFlipids): 52.2 g kCal: 1795/24h  -7/13: Adjusted Dextrose from 19% to 17% and Amino acids from 98g to 108g  and total volume from to  to see if helps with blood sugars Electrolytes in TPN: Na 75 mEq/L, K 50 mEq/L, Ca 23mEq/L, Mg 10 mEq/L, Phos 10 mmol/L, Cl:Ac 1:1  trace elements, MVI, chromium, zinc (increased to 17g on 6/22) and selenium (increased to 150mg  on 6/22) per dietary recs  Continue CBG/SSI for Cyclic TPN:  -CBG 2 hrs after cyclic TPN start -CBG during middle of cyclic TPN -CBG 1 hr after cylic TPN stopped -CBG while off TPN Continue SSI insulin at moderate scale and 13u insulin in TPN Check TPN labs weekly on Mondays and more frequently if indicated  Barrie Folk, PharmD Clinical Pharmacist 03/24/2023 6:55 AM

## 2023-03-24 NOTE — Plan of Care (Signed)
  Problem: Clinical Measurements: Goal: Ability to maintain clinical measurements within normal limits will improve Outcome: Progressing Goal: Will remain free from infection Outcome: Progressing Goal: Diagnostic test results will improve Outcome: Progressing Goal: Respiratory complications will improve Outcome: Progressing Goal: Cardiovascular complication will be avoided Outcome: Progressing   Problem: Clinical Measurements: Goal: Will remain free from infection Outcome: Progressing   Problem: Nutrition: Goal: Adequate nutrition will be maintained Outcome: Progressing   Problem: Activity: Goal: Risk for activity intolerance will decrease Outcome: Progressing   Problem: Nutrition: Goal: Adequate nutrition will be maintained Outcome: Progressing   Problem: Coping: Goal: Level of anxiety will decrease Outcome: Progressing   Problem: Pain Managment: Goal: General experience of comfort will improve Outcome: Progressing   

## 2023-03-24 NOTE — Progress Notes (Signed)
[facility] SURGICAL PROGRESS NOTE   Hospital Day(s): 404.   Post op day(s): 95 Days Post-Op.   Interval History:  Patient seen and examined Nothing acute overnight No abdominal pain, nausea, emesis, fever No new labs this morning  [name] output unmeasured in last 24 hours; managing effluent independently No bleeding noted On cyclic TPN + soft diet  Ambulating well; independent  Review of Systems:  Constitutional: denies fever, chills  HEENT: denies cough or congestion  Respiratory: denies any shortness of breath  Cardiovascular: denies chest pain or palpitations  Gastrointestinal: denied abdominal pain, denied N/V Genitourinary: denies burning with urination or urinary frequency Integumentary: + midline wound (healing; stable)  Vital signs in last 24 hours: [min-max] current  Temp:  [97.5 F (36.4 C)-98.8 F (37.1 C)] 98.8 F (37.1 C) (07/16 0537) Pulse Rate:  [78-85] 82 (07/16 0537) Resp:  [18-20] 18 (07/16 0537) BP: (103-139)/(57-73) 103/65 (07/16 0537) SpO2:  [95 %-100 %] 95 % (07/16 0537)     Height: 4\' 11"  (149.9 cm) Weight: 108.7 kg BMI (Calculated): 46.02   Intake/Output last 2 shifts:  No intake/output data recorded.   Physical Exam:  Constitutional: alert, cooperative and no distress  Respiratory: breathing non-labored at rest  Cardiovascular: regular rate and sinus rhythm  Gastrointestinal: Soft, non-tender, non-distended, no rebound/guarding. Integumentary: Midline wound healing by secondary intention, peritoneum closed; granulating and new skin growing over majority of wound now; there are three stomatized areas visible in the LUQ and LLQ portions of the wound. No active bleeding. Piece of ST mesh noted.  Chest: Port to the right chest; no further swelling or drainage; no evidence of infection   Labs:     Latest Ref Rng & Units 03/21/2023    6:27 PM 03/20/2023    7:03 AM 03/19/2023    8:06 AM  CBC  WBC 4.0 - 10.5 K/uL 4.9  2.5  2.6    Hemoglobin 12.0 - 15.0 g/dL 9.7  7.1  7.4   Hematocrit 36.0 - 46.0 % 29.1  22.1  22.9   Platelets 150 - 400 K/uL 81  70  69       Latest Ref Rng & Units 03/23/2023    7:09 AM 03/19/2023    8:06 AM 03/16/2023    6:23 AM  CMP  Glucose 70 - 99 mg/dL 161  096  045   BUN 6 - 20 mg/dL 24  23  21    Creatinine 0.44 - 1.00 mg/dL 4.09  8.11  9.14   Sodium 135 - 145 mmol/L 136  136  134   Potassium 3.5 - 5.1 mmol/L 3.7  3.5  3.8   Chloride 98 - 111 mmol/L 105  104  104   CO2 22 - 32 mmol/L 26  26  25    Calcium 8.9 - 10.3 mg/dL 7.9  8.0  7.9   Total Protein 6.5 - 8.1 g/dL 5.8  5.5  5.7   Total Bilirubin 0.3 - 1.2 mg/dL 0.7  0.5  0.6   Alkaline Phos 38 - 126 U/L 162  171  171   AST 15 - 41 U/L 53  49  51   ALT 0 - 44 U/L 37  34  35     Imaging studies: No new imaging studies this AM   Assessment/Plan:  57 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  - New 07/16: No acute changes this morning; CT next week for operative planning   - Wound Care ([name] Pouch); [name] pouch; continues managing effluent independently. Intermittent oozing from time to time. Change as needed.    - Continue soft diet as tolerated  - Continue cyclic TPN; weekly nutritional labs - Appreciate dietary assistance - monitoring weight             - Monitor abdominal examination; on-going bowel function            - Pain control prn; antemetic prn            - Progressed with therapies; no longer any recommendations; ambulating well    - Discharge Planning: Plan for definitive repair on 08/01   All of the above findings and recommendations were discussed with the patient, and the medical team,  and all of patient's questions were answered to her expressed satisfaction.  -- [name], PA-C [facility] 03/24/2023, 7:27 AM M-F: 7am - 4pm

## 2023-03-25 LAB — GLUCOSE, CAPILLARY
Glucose-Capillary: 190 mg/dL — ABNORMAL HIGH (ref 70–99)
Glucose-Capillary: 166 mg/dL — ABNORMAL HIGH (ref 70–99)
Glucose-Capillary: 121 mg/dL — ABNORMAL HIGH (ref 70–99)

## 2023-03-25 MED ORDER — ZINC CHLORIDE 1 MG/ML IV SOLN
INTRAVENOUS | Status: AC
  Filled 2023-03-25: qty 725

## 2023-03-25 NOTE — Progress Notes (Signed)
PHARMACY - TOTAL PARENTERAL NUTRITION CONSULT NOTE   Indication: Prolonged ileus  Patient Measurements: Height: 4\' 11"  (149.9 cm) Weight: 108.9 kg (240 lb 1.3 oz) IBW/kg (Calculated) : 43.2 TPN AdjBW (KG): 57.8 Body mass index is 48.49 kg/m.  Assessment: Debra [name] is a 57 y.o. female s/p laparotomy, excision of greater omental mass, abdominal wall reconstruction with [name], appendectomy, and placement of Prevena vac.  Glucose / Insulin:  --No apparent history of diabetes --mSSI 4x/day (0500, 1300, 1800, 2200) + 13 units insulin in TPN --BG last 24h: 142 - 190 --SSI last 24h: 10 units Electrolytes: WNL Renal: SCr stable at baseline Hepatic: Alk Phos 147->162, AST 53, ALT wnl, TBili Stable wnl GI Imaging: 9/11 CTAP: no new acute issues GI Surgeries / Procedures: s/p laparotomy, excision of greater omental mass, abdominal wall reconstruction with [name], appendectomy, and placement of Prevena vac 12/19/22 placement of right IJ port-a-catheter with dual reservoirs also s/p re-opening of laparotomy for repair of small bowel perforation   Central access: 02/15/2022 TPN start date: 02/15/2022  Nutritional Goals: Goal cyclic TPN over 16 hrs: cyclic TPN over 16 hrs: (provides 102 g of protein and 1811 kcals per day) total volume over 16hrs for calculations=1400 ml (1500 ml total with overfill)  RD Assessment:  Estimated Needs Total Energy Estimated Needs: 1800-2100kcal/day Total Protein Estimated Needs: 90-110g/d Total Fluid Estimated Needs: 1.4-1.6L/day  Current Nutrition:  Soft diet + nutritional supplements, not meeting PO needs  Plan:  Transitioned to *Cyclic* TPN on 6/57/84.   to run over 16 hours per MD request. [name] at 2000 per discussion with dietician to allow patient time for walking in afternoon/evenings.  To run over 16 hours: -Start rate at 49 mL/hr for 1 hour. -Increase rate to 98 mL/hr for 13 hours.  -Decrease rate to 49  mL/hr for 1 hour. -Decrease rate to 25 mL/hr for 1 hour, then stop.  Plan:  See cyclic TPN rate above Nutritional Components Amino acids (using 15% Clinisol): 108.75 g Dextrose 17% = 246 g  Lipids (using 20% SMOFlipids): 52.2 g kCal: 1795/24h  -7/13: Adjusted Dextrose from 19% to 17% and Amino acids from 98g to 108g  and total volume from to  to see if helps with blood sugars Electrolytes in TPN: Na 75 mEq/L, K 50 mEq/L, Ca 29mEq/L, Mg 10 mEq/L, Phos 10 mmol/L, Cl:Ac 1:1  trace elements, MVI, chromium, zinc (increased to 17g on 6/22) and selenium (increased to 150mg  on 6/22) per dietary recs  Continue CBG/SSI for Cyclic TPN:  -CBG 2 hrs after cyclic TPN start -CBG during middle of cyclic TPN -CBG 1 hr after cylic TPN stopped -CBG while off TPN Continue SSI insulin at moderate scale and 13u insulin in TPN Check TPN labs weekly on Mondays and more frequently if indicated  Barrie Folk, PharmD Clinical Pharmacist 03/25/2023 7:11 AM

## 2023-03-25 NOTE — Progress Notes (Signed)
[facility] SURGICAL PROGRESS NOTE   Hospital Day(s): 405.   Post op day(s): 96 Days Post-Op.   Interval History:  Patient seen and examined Nothing acute overnight No abdominal pain, nausea, emesis, fever No new labs this morning; recheck CBC in AM [name] output unmeasured in last 24 hours; managing effluent independently No bleeding noted On cyclic TPN + soft diet  Ambulating well; independent  Review of Systems:  Constitutional: denies fever, chills  HEENT: denies cough or congestion  Respiratory: denies any shortness of breath  Cardiovascular: denies chest pain or palpitations  Gastrointestinal: denied abdominal pain, denied N/V Genitourinary: denies burning with urination or urinary frequency Integumentary: + midline wound (healing; stable)  Vital signs in last 24 hours: [min-max] current  Temp:  [97.5 F (36.4 C)-98.2 F (36.8 C)] 98.1 F (36.7 C) (07/17 0336) Pulse Rate:  [72-81] 80 (07/17 0336) Resp:  [16-18] 16 (07/17 0336) BP: (107-121)/(55-73) 107/66 (07/17 0336) SpO2:  [95 %-99 %] 95 % (07/17 0336) Weight:  [108.9 kg] 108.9 kg (07/17 0500)     Height: 4\' 11"  (149.9 cm) Weight: 108.9 kg BMI (Calculated): 46.02   Intake/Output last 2 shifts:  No intake/output data recorded.   Physical Exam:  Constitutional: alert, cooperative and no distress  Respiratory: breathing non-labored at rest  Cardiovascular: regular rate and sinus rhythm  Gastrointestinal: Soft, non-tender, non-distended, no rebound/guarding. Integumentary: Midline wound healing by secondary intention, peritoneum closed; granulating and new skin growing over majority of wound now; there are three stomatized areas visible in the LUQ and LLQ portions of the wound. No active bleeding. Piece of ST mesh noted.  Chest: Port to the right chest; no further swelling or drainage; no evidence of infection   Labs:     Latest Ref Rng & Units 03/21/2023    6:27 PM 03/20/2023    7:03 AM  03/19/2023    8:06 AM  CBC  WBC 4.0 - 10.5 K/uL 4.9  2.5  2.6   Hemoglobin 12.0 - 15.0 g/dL 9.7  7.1  7.4   Hematocrit 36.0 - 46.0 % 29.1  22.1  22.9   Platelets 150 - 400 K/uL 81  70  69       Latest Ref Rng & Units 03/23/2023    7:09 AM 03/19/2023    8:06 AM 03/16/2023    6:23 AM  CMP  Glucose 70 - 99 mg/dL 130  865  784   BUN 6 - 20 mg/dL 24  23  21    Creatinine 0.44 - 1.00 mg/dL 6.96  2.95  2.84   Sodium 135 - 145 mmol/L 136  136  134   Potassium 3.5 - 5.1 mmol/L 3.7  3.5  3.8   Chloride 98 - 111 mmol/L 105  104  104   CO2 22 - 32 mmol/L 26  26  25    Calcium 8.9 - 10.3 mg/dL 7.9  8.0  7.9   Total Protein 6.5 - 8.1 g/dL 5.8  5.5  5.7   Total Bilirubin 0.3 - 1.2 mg/dL 0.7  0.5  0.6   Alkaline Phos 38 - 126 U/L 162  171  171   AST 15 - 41 U/L 53  49  51   ALT 0 - 44 U/L 37  34  35     Imaging studies: No new imaging studies this AM   Assessment/Plan:  57 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  - New 07/17: No acute changes this morning; CT next early week for operative planning   - Wound Care ([name] Pouch); [name] pouch; continues managing effluent independently. Intermittent oozing from time to time. Change as needed.    - Continue soft diet as tolerated  - Continue cyclic TPN; weekly nutritional labs - Appreciate dietary assistance - monitoring weight             - Monitor abdominal examination; on-going bowel function            - Pain control prn; antemetic prn            - Progressed with therapies; no longer any recommendations; ambulating well    - Discharge Planning: Plan for definitive repair on 08/01   All of the above findings and  recommendations were discussed with the patient, and the medical team, and all of patient's questions were answered to her expressed satisfaction.  -- [name], PA-C [facility] 03/25/2023, 7:49 AM M-F: 7am - 4pm

## 2023-03-26 LAB — CBC
HCT: 23.8 % — ABNORMAL LOW (ref 36.0–46.0)
Hemoglobin: 8 g/dL — ABNORMAL LOW (ref 12.0–15.0)
MCH: 29 pg (ref 26.0–34.0)
MCHC: 33.6 g/dL (ref 30.0–36.0)
MCV: 86.2 fL (ref 80.0–100.0)
Platelets: 63 10*3/uL — ABNORMAL LOW (ref 150–400)
RBC: 2.76 MIL/uL — ABNORMAL LOW (ref 3.87–5.11)
RDW: 16.4 % — ABNORMAL HIGH (ref 11.5–15.5)
WBC: 2.7 10*3/uL — ABNORMAL LOW (ref 4.0–10.5)
nRBC: 0 % (ref 0.0–0.2)

## 2023-03-26 LAB — GLUCOSE, CAPILLARY
Glucose-Capillary: 189 mg/dL — ABNORMAL HIGH (ref 70–99)
Glucose-Capillary: 215 mg/dL — ABNORMAL HIGH (ref 70–99)
Glucose-Capillary: 209 mg/dL — ABNORMAL HIGH (ref 70–99)
Glucose-Capillary: 178 mg/dL — ABNORMAL HIGH (ref 70–99)
Glucose-Capillary: 138 mg/dL — ABNORMAL HIGH (ref 70–99)
Glucose-Capillary: 148 mg/dL — ABNORMAL HIGH (ref 70–99)

## 2023-03-26 MED ORDER — ZINC CHLORIDE 1 MG/ML IV SOLN
INTRAVENOUS | Status: AC
  Filled 2023-03-26: qty 725

## 2023-03-26 NOTE — Progress Notes (Signed)
PHARMACY - TOTAL PARENTERAL NUTRITION CONSULT NOTE   Indication: Prolonged ileus  Patient Measurements: Height: 4\' 11"  (149.9 cm) Weight: 109.3 kg (240 lb 15.4 oz) IBW/kg (Calculated) : 43.2 TPN AdjBW (KG): 57.8 Body mass index is 48.67 kg/m.  Assessment: Debra [name] is a 57 y.o. female s/p laparotomy, excision of greater omental mass, abdominal wall reconstruction with [name], appendectomy, and placement of Prevena vac.  Glucose / Insulin:  --No apparent history of diabetes --mSSI 4x/day (0500, 1300, 1800, 2200) + 13 units insulin in TPN --BG last 24h: 121 - 189 --SSI last 24h: 11 units Electrolytes: WNL Renal: SCr stable at baseline Hepatic: Alk Phos 147->162, AST 53, ALT wnl, TBili Stable wnl GI Imaging: 9/11 CTAP: no new acute issues GI Surgeries / Procedures: s/p laparotomy, excision of greater omental mass, abdominal wall reconstruction with [name], appendectomy, and placement of Prevena vac 12/19/22 placement of right IJ port-a-catheter with dual reservoirs also s/p re-opening of laparotomy for repair of small bowel perforation   Central access: 02/15/2022 TPN start date: 02/15/2022  Nutritional Goals: Goal cyclic TPN over 16 hrs: cyclic TPN over 16 hrs: (provides 102 g of protein and 1811 kcals per day) total volume over 16hrs for calculations=1400 ml (1500 ml total with overfill)  RD Assessment:  Estimated Needs Total Energy Estimated Needs: 1800-2100kcal/day Total Protein Estimated Needs: 90-110g/d Total Fluid Estimated Needs: 1.4-1.6L/day  Current Nutrition:  Soft diet + nutritional supplements, not meeting PO needs  Plan:  Transitioned to *Cyclic* TPN on 02/09/53.   to run over 16 hours per MD request. [name] at 2000 per discussion with dietician to allow patient time for walking in afternoon/evenings.  To run over 16 hours: -Start rate at 49 mL/hr for 1 hour. -Increase rate to 98 mL/hr for 13 hours.  -Decrease rate to 49  mL/hr for 1 hour. -Decrease rate to 25 mL/hr for 1 hour, then stop.  Plan:  See cyclic TPN rate above Nutritional Components Amino acids (using 15% Clinisol): 108.75 g Dextrose 17% = 246 g  Lipids (using 20% SMOFlipids): 52.2 g kCal: 1795/24h  -7/13: Adjusted Dextrose from 19% to 17% and Amino acids from 98g to 108g  and total volume from to  to see if helps with blood sugars Electrolytes in TPN: Na 75 mEq/L, K 50 mEq/L, Ca 98mEq/L, Mg 10 mEq/L, Phos 10 mmol/L, Cl:Ac 1:1  trace elements, MVI, chromium, zinc (increased to 17g on 6/22) and selenium (increased to 150mg  on 6/22) per dietary recs  Continue CBG/SSI for Cyclic TPN:  -CBG 2 hrs after cyclic TPN start -CBG during middle of cyclic TPN -CBG 1 hr after cylic TPN stopped -CBG while off TPN Continue SSI insulin at moderate scale and 13u insulin in TPN Check TPN labs weekly on Mondays and more frequently if indicated  Barrie Folk, PharmD Clinical Pharmacist 03/26/2023 8:22 AM

## 2023-03-26 NOTE — Progress Notes (Addendum)
[facility] SURGICAL PROGRESS NOTE   Hospital Day(s): 406.   Post op day(s): 97 Days Post-Op.   Interval History:  Patient seen and examined Nothing acute overnight No abdominal pain, nausea, emesis, fever No new labs this morning; recheck CBC pending [name] output unmeasured in last 24 hours; managing effluent independently No bleeding noted On cyclic TPN + soft diet  Ambulating well; independent  Review of Systems:  Constitutional: denies fever, chills  HEENT: denies cough or congestion  Respiratory: denies any shortness of breath  Cardiovascular: denies chest pain or palpitations  Gastrointestinal: denied abdominal pain, denied N/V Genitourinary: denies burning with urination or urinary frequency Integumentary: + midline wound (healing; stable)  Vital signs in last 24 hours: [min-max] current  Temp:  [97.9 F (36.6 C)-98.2 F (36.8 C)] 98.1 F (36.7 C) (07/18 0504) Pulse Rate:  [77-80] 77 (07/18 0504) Resp:  [16-20] 20 (07/18 0504) BP: (105-118)/(56-80) 109/56 (07/18 0504) SpO2:  [91 %-99 %] 95 % (07/18 0504) Weight:  [109.3 kg] 109.3 kg (07/18 0504)     Height: 4\' 11"  (149.9 cm) Weight: 109.3 kg BMI (Calculated): 46.02   Intake/Output last 2 shifts:  07/17 0701 - 07/18 0700 In: 60 [P.O.:60] Out: -    Physical Exam:  Constitutional: alert, cooperative and no distress  Respiratory: breathing non-labored at rest  Cardiovascular: regular rate and sinus rhythm  Gastrointestinal: Soft, non-tender, non-distended, no rebound/guarding. Integumentary: Midline wound healing by secondary intention, peritoneum closed; granulating and new skin growing over majority of wound now; there are three stomatized areas visible in the LUQ and LLQ portions of the wound. No active bleeding. Piece of ST mesh noted.  Chest: Port to the right chest; no further swelling or drainage; no evidence of infection   Labs:     Latest Ref Rng & Units 03/21/2023    6:27 PM 03/20/2023     7:03 AM 03/19/2023    8:06 AM  CBC  WBC 4.0 - 10.5 K/uL 4.9  2.5  2.6   Hemoglobin 12.0 - 15.0 g/dL 9.7  7.1  7.4   Hematocrit 36.0 - 46.0 % 29.1  22.1  22.9   Platelets 150 - 400 K/uL 81  70  69       Latest Ref Rng & Units 03/23/2023    7:09 AM 03/19/2023    8:06 AM 03/16/2023    6:23 AM  CMP  Glucose 70 - 99 mg/dL 295  621  308   BUN 6 - 20 mg/dL 24  23  21    Creatinine 0.44 - 1.00 mg/dL 6.57  8.46  9.62   Sodium 135 - 145 mmol/L 136  136  134   Potassium 3.5 - 5.1 mmol/L 3.7  3.5  3.8   Chloride 98 - 111 mmol/L 105  104  104   CO2 22 - 32 mmol/L 26  26  25    Calcium 8.9 - 10.3 mg/dL 7.9  8.0  7.9   Total Protein 6.5 - 8.1 g/dL 5.8  5.5  5.7   Total Bilirubin 0.3 - 1.2 mg/dL 0.7  0.5  0.6   Alkaline Phos 38 - 126 U/L 162  171  171   AST 15 - 41 U/L 53  49  51   ALT 0 - 44 U/L 37  34  35     Imaging studies: No new imaging studies this AM   Assessment/Plan:  57 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  - New 07/18: Nothing acute today; CT next early week for operative planning   - Wound Care ([name] Pouch); [name] pouch; continues managing effluent independently. Intermittent oozing from time to time. Change as needed.    - Continue soft diet as tolerated  - Continue cyclic TPN; weekly nutritional labs - Appreciate dietary assistance - monitoring weight             - Monitor abdominal examination; on-going bowel function            - Pain control prn; antemetic prn            - Progressed with therapies; no longer any recommendations; ambulating well    - Discharge Planning: Plan for definitive repair on 08/01   All of the above findings and  recommendations were discussed with the patient, and the medical team, and all of patient's questions were answered to her expressed satisfaction.  -- [name], PA-C [facility] 03/26/2023, 7:17 AM M-F: 7am - 4pm

## 2023-03-27 LAB — GLUCOSE, CAPILLARY
Glucose-Capillary: 215 mg/dL — ABNORMAL HIGH (ref 70–99)
Glucose-Capillary: 160 mg/dL — ABNORMAL HIGH (ref 70–99)
Glucose-Capillary: 149 mg/dL — ABNORMAL HIGH (ref 70–99)
Glucose-Capillary: 122 mg/dL — ABNORMAL HIGH (ref 70–99)

## 2023-03-27 MED ORDER — ZINC CHLORIDE 1 MG/ML IV SOLN
INTRAVENOUS | Status: AC
  Filled 2023-03-27: qty 725

## 2023-03-27 NOTE — Progress Notes (Signed)
[facility] SURGICAL PROGRESS NOTE   Hospital Day(s): 407.   Post op day(s): 98 Days Post-Op.   Interval History:  Patient seen and examined Nothing acute overnight No abdominal pain, nausea, emesis, fever No new labs this morning [name] output unmeasured in last 24 hours; managing effluent independently No bleeding noted On cyclic TPN + soft diet  Ambulating well; independent  Review of Systems:  Constitutional: denies fever, chills  HEENT: denies cough or congestion  Respiratory: denies any shortness of breath  Cardiovascular: denies chest pain or palpitations  Gastrointestinal: denied abdominal pain, denied N/V Genitourinary: denies burning with urination or urinary frequency Integumentary: + midline wound (healing; stable)  Vital signs in last 24 hours: [min-max] current  Temp:  [98 F (36.7 C)-98.3 F (36.8 C)] 98.3 F (36.8 C) (07/19 0600) Pulse Rate:  [81-95] 81 (07/19 0600) Resp:  [15-19] 15 (07/19 0600) BP: (98-121)/(56-76) 98/56 (07/19 0600) SpO2:  [99 %-100 %] 99 % (07/19 0600) Weight:  [108.9 kg] 108.9 kg (07/19 0500)     Height: 4\' 11"  (149.9 cm) Weight: 108.9 kg BMI (Calculated): 46.02   Intake/Output last 2 shifts:  07/18 0701 - 07/19 0700 In: 2868.6 [P.O.:500; I.V.:2368.6] Out: 0    Physical Exam:  Constitutional: alert, cooperative and no distress  Respiratory: breathing non-labored at rest  Cardiovascular: regular rate and sinus rhythm  Gastrointestinal: Soft, non-tender, non-distended, no rebound/guarding. Integumentary: Midline wound healing by secondary intention, peritoneum closed; granulating and new skin growing over majority of wound now; there are three stomatized areas visible in the LUQ and LLQ portions of the wound. No active bleeding. Piece of ST mesh noted.  Chest: Port to the right chest; no further swelling or drainage; no evidence of infection   Labs:     Latest Ref Rng & Units 03/26/2023    8:32 AM 03/21/2023     6:27 PM 03/20/2023    7:03 AM  CBC  WBC 4.0 - 10.5 K/uL 2.7  4.9  2.5   Hemoglobin 12.0 - 15.0 g/dL 8.0  9.7  7.1   Hematocrit 36.0 - 46.0 % 23.8  29.1  22.1   Platelets 150 - 400 K/uL 63  81  70       Latest Ref Rng & Units 03/23/2023    7:09 AM 03/19/2023    8:06 AM 03/16/2023    6:23 AM  CMP  Glucose 70 - 99 mg/dL 010  272  536   BUN 6 - 20 mg/dL 24  23  21    Creatinine 0.44 - 1.00 mg/dL 6.44  0.34  7.42   Sodium 135 - 145 mmol/L 136  136  134   Potassium 3.5 - 5.1 mmol/L 3.7  3.5  3.8   Chloride 98 - 111 mmol/L 105  104  104   CO2 22 - 32 mmol/L 26  26  25    Calcium 8.9 - 10.3 mg/dL 7.9  8.0  7.9   Total Protein 6.5 - 8.1 g/dL 5.8  5.5  5.7   Total Bilirubin 0.3 - 1.2 mg/dL 0.7  0.5  0.6   Alkaline Phos 38 - 126 U/L 162  171  171   AST 15 - 41 U/L 53  49  51   ALT 0 - 44 U/L 37  34  35     Imaging studies: No new imaging studies this AM   Assessment/Plan:  57 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  - New 07/19: Nothing acute today; CT next early week for operative planning   - Wound Care ([name] Pouch); [name] pouch; continues managing effluent independently. Intermittent oozing from time to time. Change as needed.    - Continue soft diet as tolerated  - Continue cyclic TPN; weekly nutritional labs - Appreciate dietary assistance - monitoring weight             - Monitor abdominal examination; on-going bowel function            - Pain control prn; antemetic prn            - Progressed with therapies; no longer any recommendations; ambulating well    - Discharge Planning: Plan for definitive repair on 08/01   All of the above findings and  recommendations were discussed with the patient, and the medical team, and all of patient's questions were answered to her expressed satisfaction.  -- [name], PA-C [facility] 03/27/2023, 7:45 AM M-F: 7am - 4pm

## 2023-03-27 NOTE — Plan of Care (Signed)
  Problem: Clinical Measurements: Goal: Ability to maintain clinical measurements within normal limits will improve Outcome: Progressing Goal: Cardiovascular complication will be avoided Outcome: Progressing   Problem: Pain Managment: Goal: General experience of comfort will improve Outcome: Progressing   

## 2023-03-27 NOTE — Plan of Care (Signed)
  Problem: Clinical Measurements: Goal: Ability to maintain clinical measurements within normal limits will improve Outcome: Progressing Goal: Will remain free from infection Outcome: Progressing Goal: Diagnostic test results will improve Outcome: Progressing Goal: Respiratory complications will improve Outcome: Progressing Goal: Cardiovascular complication will be avoided Outcome: Progressing   Problem: Clinical Measurements: Goal: Will remain free from infection Outcome: Progressing   Problem: Nutrition: Goal: Adequate nutrition will be maintained Outcome: Progressing   Problem: Activity: Goal: Risk for activity intolerance will decrease Outcome: Progressing   Problem: Nutrition: Goal: Adequate nutrition will be maintained Outcome: Progressing   Problem: Coping: Goal: Level of anxiety will decrease Outcome: Progressing   Problem: Pain Managment: Goal: General experience of comfort will improve Outcome: Progressing   

## 2023-03-27 NOTE — Progress Notes (Signed)
PHARMACY - TOTAL PARENTERAL NUTRITION CONSULT NOTE   Indication: Prolonged ileus  Patient Measurements: Height: 4\' 11"  (149.9 cm) Weight: 108.9 kg (240 lb 1.3 oz) IBW/kg (Calculated) : 43.2 TPN AdjBW (KG): 57.8 Body mass index is 48.49 kg/m.  Assessment: Debra [name] is a 57 y.o. female s/p laparotomy, excision of greater omental mass, abdominal wall reconstruction with [name], appendectomy, and placement of Prevena vac.  Glucose / Insulin:  --No apparent history of diabetes --mSSI 4x/day (0500, 1300, 1800, 2200) + 13 units insulin in TPN --BG last 24h: 138-215 --SSI last 24h: 12 units Electrolytes: WNL Renal: SCr stable at baseline Hepatic: Alk Phos 147->162, AST 53, ALT wnl, TBili Stable wnl GI Imaging: 9/11 CTAP: no new acute issues GI Surgeries / Procedures: s/p laparotomy, excision of greater omental mass, abdominal wall reconstruction with [name], appendectomy, and placement of Prevena vac 12/19/22 placement of right IJ port-a-catheter with dual reservoirs also s/p re-opening of laparotomy for repair of small bowel perforation   Central access: 02/15/2022 TPN start date: 02/15/2022  Nutritional Goals: Goal cyclic TPN over 16 hrs: cyclic TPN over 16 hrs: (provides 102 g of protein and 1811 kcals per day) total volume over 16hrs for calculations=1400 ml (1500 ml total with overfill)  RD Assessment:  Estimated Needs Total Energy Estimated Needs: 1800-2100kcal/day Total Protein Estimated Needs: 90-110g/d Total Fluid Estimated Needs: 1.4-1.6L/day  Current Nutrition:  Soft diet + nutritional supplements, not meeting PO needs  Plan:  Transitioned to *Cyclic* TPN on 04/02/35.   to run over 16 hours per MD request. [name] at 2000 per discussion with dietician to allow patient time for walking in afternoon/evenings.  To run over 16 hours: -Start rate at 49 mL/hr for 1 hour. -Increase rate to 98 mL/hr for 13 hours.  -Decrease rate to 49  mL/hr for 1 hour. -Decrease rate to 25 mL/hr for 1 hour, then stop.  Plan:  See cyclic TPN rate above Nutritional Components Amino acids (using 15% Clinisol): 108.75 g Dextrose 17% = 246 g  Lipids (using 20% SMOFlipids): 52.2 g kCal: 1795/24h  -7/13: Adjusted Dextrose from 19% to 17% and Amino acids from 98g to 108g  and total volume from to  to see if helps with blood sugars Electrolytes in TPN: Na 75 mEq/L, K 50 mEq/L, Ca 83mEq/L, Mg 10 mEq/L, Phos 10 mmol/L, Cl:Ac 1:1  trace elements, MVI, chromium, zinc (increased to 17g on 6/22) and selenium (increased to 150mg  on 6/22) per dietary recs  Continue CBG/SSI for Cyclic TPN:  -CBG 2 hrs after cyclic TPN start -CBG during middle of cyclic TPN -CBG 1 hr after cylic TPN stopped -CBG while off TPN Continue SSI insulin at moderate scale and 13u insulin in TPN Check TPN labs weekly on Mondays and more frequently if indicated  Barrie Folk, PharmD Clinical Pharmacist 03/27/2023 7:58 AM

## 2023-03-27 NOTE — Progress Notes (Signed)
Chap visited Pt at bedside and Mirna Mires supplied prayer and compassionate presence. Mirna Mires will remain available as need arise.   03/27/23 1400  Spiritual Encounters  Type of Visit Initial  Care provided to: Patient  Conversation partners present during encounter Social worker/Care management/TOC  Referral source Social worker/Care management/TOC  Reason for visit Routine spiritual support  OnCall Visit Yes  Spiritual Needs Emotional  Interventions  Spiritual Care Interventions Made Established relationship of care and support;Mindfulness intervention  Intervention Outcomes  Outcomes Awareness of support;Connection to spiritual care  Spiritual Care Plan  Spiritual Care Issues Still Outstanding No further spiritual care needs at this time (see row info)

## 2023-03-27 NOTE — TOC Progression Note (Signed)
Transition of Care [facility]) - Progression Note    Patient Details  Name: Debra [name] MRN: [id number] Date of Birth: 1965-11-20  Transition of Care [facility]) CM/SW Contact  Darleene Cleaver, [place] Phone Number: 03/27/2023, 6:51 PM  Clinical Narrative:     TOC continuing to follow patient's progress, no new updates.  Expected Discharge Plan: Home w Home Health Services Barriers to Discharge: Continued Medical Work up  Expected Discharge Plan and Services                                               Social Determinants of Health (SDOH) Interventions SDOH Screenings   Food Insecurity: No Food Insecurity (05/20/2022)  Housing: Low Risk  (05/20/2022)  Transportation Needs: No Transportation Needs (05/20/2022)  Utilities: Not At Risk (05/20/2022)  Tobacco Use: Low Risk  (05/19/2022)    Readmission Risk Interventions     No data to display

## 2023-03-28 MED ORDER — ZINC CHLORIDE 1 MG/ML IV SOLN
INTRAVENOUS | Status: AC
  Filled 2023-03-28: qty 725

## 2023-03-28 NOTE — Progress Notes (Signed)
PHARMACY - TOTAL PARENTERAL NUTRITION CONSULT NOTE   Indication: Prolonged ileus  Patient Measurements: Height: 4\' 11"  (149.9 cm) Weight: 109 kg (240 lb 4.8 oz) IBW/kg (Calculated) : 43.2 TPN AdjBW (KG): 57.8 Body mass index is 48.53 kg/m.  Assessment: Debra [name] is a 57 y.o. female s/p laparotomy, excision of greater omental mass, abdominal wall reconstruction with [name], appendectomy, and placement of Prevena vac.  Glucose / Insulin:  --No apparent history of diabetes --mSSI 4x/day (0500, 1300, 1800, 2200) + 13 units insulin in TPN --BG last 24h: 138-215 --SSI last 24h: 12 units Electrolytes: WNL Renal: SCr stable at baseline Hepatic: Alk Phos 147->162, AST 53, ALT wnl, TBili Stable wnl GI Imaging: 9/11 CTAP: no new acute issues GI Surgeries / Procedures: s/p laparotomy, excision of greater omental mass, abdominal wall reconstruction with [name], appendectomy, and placement of Prevena vac 12/19/22 placement of right IJ port-a-catheter with dual reservoirs also s/p re-opening of laparotomy for repair of small bowel perforation   Central access: 02/15/2022 TPN start date: 02/15/2022  Nutritional Goals: Goal cyclic TPN over 16 hrs: cyclic TPN over 16 hrs: (provides 102 g of protein and 1811 kcals per day) total volume over 16hrs for calculations=1400 ml (1500 ml total with overfill)  RD Assessment:  Estimated Needs Total Energy Estimated Needs: 1800-2100kcal/day Total Protein Estimated Needs: 90-110g/d Total Fluid Estimated Needs: 1.4-1.6L/day  Current Nutrition:  Soft diet + nutritional supplements, not meeting PO needs  Plan:  Transitioned to *Cyclic* TPN on 03/07/51.   to run over 16 hours per MD request. [name] at 2000 per discussion with dietician to allow patient time for walking in afternoon/evenings.  To run over 16 hours: -Start rate at 49 mL/hr for 1 hour. -Increase rate to 98 mL/hr for 13 hours.  -Decrease rate to 49 mL/hr  for 1 hour. -Decrease rate to 25 mL/hr for 1 hour, then stop.  Plan:  See cyclic TPN rate above Nutritional Components Amino acids (using 15% Clinisol): 108.75 g Dextrose 17% = 246 g  Lipids (using 20% SMOFlipids): 52.2 g kCal: 1795/24h  -7/13: Adjusted Dextrose from 19% to 17% and Amino acids from 98g to 108g  and total volume from to  to see if helps with blood sugars Electrolytes in TPN: Na 75 mEq/L, K 50 mEq/L, Ca 44mEq/L, Mg 10 mEq/L, Phos 10 mmol/L, Cl:Ac 1:1 trace elements, MVI, chromium, zinc (increased to 17g on 6/22) and selenium (increased to 150mg  on 6/22) per dietary recs  Continue CBG/SSI for Cyclic TPN:  -CBG 2 hrs after cyclic TPN start -CBG during middle of cyclic TPN -CBG 1 hr after cylic TPN stopped -CBG while off TPN Continue SSI insulin at moderate scale and 13u insulin in TPN Check TPN labs weekly on Mondays and more frequently if indicated  Bettey Costa, PharmD Clinical Pharmacist 03/28/2023 8:06 AM

## 2023-03-28 NOTE — Progress Notes (Signed)
Subjective:  CC: Debra [name] is a 57 y.o. female  Hospital stay day 408, 99 Days Post-Op EC fistula  HPI: No issues reported overnight.  ROS:  General: Denies weight loss, weight gain, fatigue, fevers, chills, and night sweats. Heart: Denies chest pain, palpitations, racing heart, irregular heartbeat, leg pain or swelling, and decreased activity tolerance. Respiratory: Denies breathing difficulty, shortness of breath, wheezing, cough, and sputum. GI: Denies change in appetite, heartburn, nausea, vomiting, constipation, diarrhea, and blood in stool. GU: Denies difficulty urinating, pain with urinating, urgency, frequency, blood in urine.   Objective:   Temp:  [98 F (36.7 C)-99.2 F (37.3 C)] 98.9 F (37.2 C) (07/20 0800) Pulse Rate:  [78-86] 82 (07/20 0800) Resp:  [16-20] 16 (07/20 0800) BP: (102-131)/(57-77) 107/59 (07/20 0800) SpO2:  [96 %-98 %] 96 % (07/20 0800) Weight:  [109 kg] 109 kg (07/20 0500)     Height: 4\' 11"  (149.9 cm) Weight: 109 kg BMI (Calculated): 46.02   Intake/Output this shift:   Intake/Output Summary (Last 24 hours) at 03/28/2023 1226 Last data filed at 03/28/2023 1125 Gross per 24 hour  Intake 1207.36 ml  Output --  Net 1207.36 ml    Constitutional :  alert, cooperative, appears stated age, and no distress  Respiratory:  clear to auscultation bilaterally  Cardiovascular:  regular rate and rhythm  Gastrointestinal: soft, non-tender; bowel sounds normal; no masses,  no organomegaly and [name]'s pouch remains intact .   Skin: Cool and moist.   Psychiatric: Normal affect, non-agitated, not confused       LABS:     Latest Ref Rng & Units 03/23/2023    7:09 AM 03/19/2023    8:06 AM 03/16/2023    6:23 AM  CMP  Glucose 70 - 99 mg/dL 578  469  629   BUN 6 - 20 mg/dL 24  23  21    Creatinine 0.44 - 1.00 mg/dL 5.28  4.13  2.44   Sodium 135 - 145 mmol/L 136  136  134   Potassium 3.5 - 5.1 mmol/L 3.7  3.5  3.8   Chloride 98 - 111 mmol/L 105  104  104    CO2 22 - 32 mmol/L 26  26  25    Calcium 8.9 - 10.3 mg/dL 7.9  8.0  7.9   Total Protein 6.5 - 8.1 g/dL 5.8  5.5  5.7   Total Bilirubin 0.3 - 1.2 mg/dL 0.7  0.5  0.6   Alkaline Phos 38 - 126 U/L 162  171  171   AST 15 - 41 U/L 53  49  51   ALT 0 - 44 U/L 37  34  35       Latest Ref Rng & Units 03/26/2023    8:32 AM 03/21/2023    6:27 PM 03/20/2023    7:03 AM  CBC  WBC 4.0 - 10.5 K/uL 2.7  4.9  2.5   Hemoglobin 12.0 - 15.0 g/dL 8.0  9.7  7.1   Hematocrit 36.0 - 46.0 % 23.8  29.1  22.1   Platelets 150 - 400 K/uL 63  81  70     RADS: N/A Assessment:   EC fistula.  No changes reported overnight.  Stable.  Continue present management.  labs/images/medications/previous chart entries reviewed personally and relevant changes/updates noted above.

## 2023-03-29 LAB — GLUCOSE, CAPILLARY
Glucose-Capillary: 121 mg/dL — ABNORMAL HIGH (ref 70–99)
Glucose-Capillary: 151 mg/dL — ABNORMAL HIGH (ref 70–99)

## 2023-03-29 MED ORDER — STERILE WATER FOR INJECTION IV SOLN
INTRAVENOUS | Status: DC
  Filled 2023-03-29: qty 725

## 2023-03-29 MED ORDER — STERILE WATER FOR INJECTION IV SOLN
INTRAVENOUS | Status: AC
  Filled 2023-03-29: qty 725

## 2023-03-29 NOTE — Plan of Care (Signed)

## 2023-03-29 NOTE — Plan of Care (Signed)
  Problem: Clinical Measurements: Goal: Ability to maintain clinical measurements within normal limits will improve Outcome: Progressing   Problem: Clinical Measurements: Goal: Will remain free from infection Outcome: Progressing   Problem: Clinical Measurements: Goal: Diagnostic test results will improve Outcome: Progressing   Problem: Clinical Measurements: Goal: Will remain free from infection Outcome: Progressing

## 2023-03-29 NOTE — Progress Notes (Addendum)
PHARMACY - TOTAL PARENTERAL NUTRITION CONSULT NOTE   Indication: Prolonged ileus  Patient Measurements: Height: 4\' 11"  (149.9 cm) Weight: 109 kg (240 lb 4.8 oz) IBW/kg (Calculated) : 43.2 TPN AdjBW (KG): 57.8 Body mass index is 48.53 kg/m.  Assessment: Debra [name] is a 57 y.o. female s/p laparotomy, excision of greater omental mass, abdominal wall reconstruction with [name], appendectomy, and placement of Prevena vac.  Glucose / Insulin:  --No apparent history of diabetes --mSSI 4x/day (0500, 1300, 1800, 2200) + 13 units insulin in TPN --BG last 24h: 122-215 --SSI last 24h: 12 units Electrolytes: WNL Renal: SCr stable at baseline Hepatic: Alk Phos 147->162, AST 53, ALT wnl, TBili Stable wnl GI Imaging: 9/11 CTAP: no new acute issues GI Surgeries / Procedures: s/p laparotomy, excision of greater omental mass, abdominal wall reconstruction with [name], appendectomy, and placement of Prevena vac 12/19/22 placement of right IJ port-a-catheter with dual reservoirs also s/p re-opening of laparotomy for repair of small bowel perforation   Central access: 02/15/2022 TPN start date: 02/15/2022  Nutritional Goals: Goal cyclic TPN over 16 hrs: cyclic TPN over 16 hrs: (provides 102 g of protein and 1811 kcals per day) total volume over 16hrs for calculations=1400 ml (1500 ml total with overfill)  RD Assessment:  Estimated Needs Total Energy Estimated Needs: 1800-2100kcal/day Total Protein Estimated Needs: 90-110g/d Total Fluid Estimated Needs: 1.4-1.6L/day  Current Nutrition:  Soft diet + nutritional supplements, not meeting PO needs  Plan:  Transitioned to *Cyclic* TPN on 6/96/29.   to run over 16 hours per MD request. [name] at 2000 per discussion with dietician to allow patient time for walking in afternoon/evenings.  To run over 16 hours: -Start rate at 49 mL/hr for 1 hour. -Increase rate to 98 mL/hr for 13 hours.  -Decrease rate to 49 mL/hr  for 1 hour. -Decrease rate to 25 mL/hr for 1 hour, then stop.  Plan:  See cyclic TPN rate above Nutritional Components Amino acids (using 15% Clinisol): 108.75 g Dextrose 17% = 246 g  Lipids (using 20% SMOFlipids): 52.2 g kCal: 1795/24h  -7/13: Adjusted Dextrose from 19% to 17% and Amino acids from 98g to 108g  and total volume from to  to see if helps with blood sugars Electrolytes in TPN: Na 75 mEq/L, K 50 mEq/L, Ca 42mEq/L, Mg 10 mEq/L, Phos 10 mmol/L, Cl:Ac 1:1 In anticipation for possible return to OR, Dieticians requested to remove trace elements, chromium, zinc and selenium in order to obtain labs per dietary recs. Can likely re-introduce on Monday, but will need to confirm with dietary. Continue CBG/SSI for Cyclic TPN:  -CBG 2 hrs after cyclic TPN start -CBG during middle of cyclic TPN -CBG 1 hr after cylic TPN stopped -CBG while off TPN Continue SSI insulin at moderate scale and 13u insulin in TPN Check TPN labs weekly on Mondays and more frequently if indicated  Bettey Costa, PharmD Clinical Pharmacist 03/29/2023 11:19 AM

## 2023-03-30 ENCOUNTER — Inpatient Hospital Stay: Payer: Medicaid Other

## 2023-03-30 LAB — COMPREHENSIVE METABOLIC PANEL
ALT: 38 U/L (ref 0–44)
AST: 55 U/L — ABNORMAL HIGH (ref 15–41)
Albumin: 2.3 g/dL — ABNORMAL LOW (ref 3.5–5.0)
Alkaline Phosphatase: 150 U/L — ABNORMAL HIGH (ref 38–126)
Anion gap: 5 (ref 5–15)
BUN: 24 mg/dL — ABNORMAL HIGH (ref 6–20)
CO2: 29 mmol/L (ref 22–32)
Calcium: 7.8 mg/dL — ABNORMAL LOW (ref 8.9–10.3)
Chloride: 103 mmol/L (ref 98–111)
Creatinine, Ser: 0.53 mg/dL (ref 0.44–1.00)
GFR, Estimated: >60 mL/min (ref >60)
Glucose, Bld: 195 mg/dL — ABNORMAL HIGH (ref 70–99)
Potassium: 3.6 mmol/L (ref 3.5–5.1)
Sodium: 137 mmol/L (ref 135–145)
Total Bilirubin: 0.6 mg/dL (ref 0.3–1.2)
Total Protein: 5.7 g/dL — ABNORMAL LOW (ref 6.5–8.1)

## 2023-03-30 LAB — CBC
HCT: 22.6 % — ABNORMAL LOW (ref 36.0–46.0)
Hemoglobin: 7.6 g/dL — ABNORMAL LOW (ref 12.0–15.0)
MCH: 28.8 pg (ref 26.0–34.0)
MCHC: 33.6 g/dL (ref 30.0–36.0)
MCV: 85.6 fL (ref 80.0–100.0)
Platelets: 64 10*3/uL — ABNORMAL LOW (ref 150–400)
RBC: 2.64 MIL/uL — ABNORMAL LOW (ref 3.87–5.11)
RDW: 16.6 % — ABNORMAL HIGH (ref 11.5–15.5)
WBC: 2.8 10*3/uL — ABNORMAL LOW (ref 4.0–10.5)
nRBC: 0 % (ref 0.0–0.2)

## 2023-03-30 LAB — GLUCOSE, CAPILLARY
Glucose-Capillary: 216 mg/dL — ABNORMAL HIGH (ref 70–99)
Glucose-Capillary: 166 mg/dL — ABNORMAL HIGH (ref 70–99)
Glucose-Capillary: 145 mg/dL — ABNORMAL HIGH (ref 70–99)
Glucose-Capillary: 123 mg/dL — ABNORMAL HIGH (ref 70–99)
Glucose-Capillary: 178 mg/dL — ABNORMAL HIGH (ref 70–99)
Glucose-Capillary: 203 mg/dL — ABNORMAL HIGH (ref 70–99)
Glucose-Capillary: 182 mg/dL — ABNORMAL HIGH (ref 70–99)
Glucose-Capillary: 201 mg/dL — ABNORMAL HIGH (ref 70–99)
Glucose-Capillary: 156 mg/dL — ABNORMAL HIGH (ref 70–99)
Glucose-Capillary: 113 mg/dL — ABNORMAL HIGH (ref 70–99)
Glucose-Capillary: 139 mg/dL — ABNORMAL HIGH (ref 70–99)

## 2023-03-30 LAB — VITAMIN B12: Vitamin B-12: 870 pg/mL (ref 180–914)

## 2023-03-30 LAB — FOLATE: Folate: 14.8 ng/mL (ref >5.9)

## 2023-03-30 LAB — MAGNESIUM: Magnesium: 2.3 mg/dL (ref 1.7–2.4)

## 2023-03-30 LAB — VITAMIN D 25 HYDROXY (VIT D DEFICIENCY, FRACTURES): Vit D, 25-Hydroxy: 39.67 ng/mL (ref 30–100)

## 2023-03-30 LAB — FERRITIN: Ferritin: 21 ng/mL (ref 11–307)

## 2023-03-30 LAB — PHOSPHORUS: Phosphorus: 2.7 mg/dL (ref 2.5–4.6)

## 2023-03-30 MED ORDER — MENTHOL 3 MG MT LOZG
1.0000 | LOZENGE | OROMUCOSAL | Status: DC | PRN
  Administered 2023-03-30: 3 mg via ORAL
  Filled 2023-03-30 (×2): qty 9

## 2023-03-30 MED ORDER — ZINC CHLORIDE 1 MG/ML IV SOLN
INTRAVENOUS | Status: AC
  Filled 2023-03-30: qty 725

## 2023-03-30 NOTE — Progress Notes (Signed)
PHARMACY - TOTAL PARENTERAL NUTRITION CONSULT NOTE   Indication: Prolonged ileus  Patient Measurements: Height: 4\' 11"  (149.9 cm) Weight: 109 kg (240 lb 4.8 oz) IBW/kg (Calculated) : 43.2 TPN AdjBW (KG): 57.8 Body mass index is 48.53 kg/m.  Assessment: Debra [name] is a 57 y.o. female s/p laparotomy, excision of greater omental mass, abdominal wall reconstruction with [name], appendectomy, and placement of Prevena vac.  Glucose / Insulin:  --No apparent history of diabetes --mSSI 4x/day (0500, 1300, 1800, 2200) + 13 units insulin in TPN --BG last 24h: 121-201 --SSI last 24h: 12 units Electrolytes: WNL Renal: SCr stable at baseline Hepatic: Alk Phos 162>>150, AST 55, ALT wnl, TBili Stable wnl GI Imaging: 9/11 CTAP: no new acute issues GI Surgeries / Procedures: s/p laparotomy, excision of greater omental mass, abdominal wall reconstruction with [name], appendectomy, and placement of Prevena vac 12/19/22 placement of right IJ port-a-catheter with dual reservoirs also s/p re-opening of laparotomy for repair of small bowel perforation   Central access: 02/15/2022 TPN start date: 02/15/2022  Nutritional Goals: Goal cyclic TPN over 16 hrs: cyclic TPN over 16 hrs: (provides 102 g of protein and 1811 kcals per day) total volume over 16hrs for calculations=1400 ml (1500 ml total with overfill)  RD Assessment:  Estimated Needs Total Energy Estimated Needs: 1800-2100kcal/day Total Protein Estimated Needs: 90-110g/d Total Fluid Estimated Needs: 1.4-1.6L/day  Current Nutrition:  Soft diet + nutritional supplements, not meeting PO needs  Plan:  Transitioned to *Cyclic* TPN on 5/63/87.   to run over 16 hours per MD request. [name] at 2000 per discussion with dietician to allow patient time for walking in afternoon/evenings.  To run over 16 hours: -Start rate at 49 mL/hr for 1 hour. -Increase rate to 98 mL/hr for 13 hours.  -Decrease rate to 49 mL/hr  for 1 hour. -Decrease rate to 25 mL/hr for 1 hour, then stop.  Plan:  See cyclic TPN rate above Nutritional Components Amino acids (using 15% Clinisol): 108.75 g Dextrose 17% = 246 g  Lipids (using 20% SMOFlipids): 52.2 g kCal: 1795/24h  -7/13: Adjusted Dextrose from 19% to 17% and Amino acids from 98g to 108g  and total volume from to  to see if helps with blood sugars Electrolytes in TPN: Na 75 mEq/L, K 50 mEq/L, Ca 64mEq/L, Mg 10 mEq/L, Phos 10 mmol/L, Cl:Ac 1:1 Resume MVI, trace elements, chromium, zinc 10 mg, and selenium 150 mcg per dietary on 03/30/23 Continue CBG/SSI for Cyclic TPN:  -CBG 2 hrs after cyclic TPN start -CBG during middle of cyclic TPN -CBG 1 hr after cylic TPN stopped -CBG while off TPN Continue SSI insulin at moderate scale and 13u insulin in TPN Check TPN labs weekly on Mondays and more frequently if indicated  Barrie Folk, PharmD Clinical Pharmacist 03/30/2023 10:34 AM

## 2023-03-30 NOTE — Progress Notes (Signed)
Blood glucose is 201. Glucometer did not transfer data into Epic.

## 2023-03-30 NOTE — Progress Notes (Addendum)
[facility] SURGICAL PROGRESS NOTE   Hospital Day(s): 410.   Post op day(s): 101 Days Post-Op.   Interval History:  Patient seen and examined Nothing acute overnight Nutritional labs are pending [name] output 250 ccs + unmeasured in last 24 hours; managing effluent independently No bleeding noted On cyclic TPN + soft diet  Ambulating well; independent  Review of Systems:  Constitutional: denies fever, chills  HEENT: denies cough or congestion  Respiratory: denies any shortness of breath  Cardiovascular: denies chest pain or palpitations  Gastrointestinal: denied abdominal pain, denied N/V Genitourinary: denies burning with urination or urinary frequency Integumentary: + midline wound (healing; stable)  Vital signs in last 24 hours: [min-max] current  Temp:  [98.1 F (36.7 C)-99.3 F (37.4 C)] 99.3 F (37.4 C) (07/22 0319) Pulse Rate:  [81-88] 88 (07/22 0319) Resp:  [16-20] 20 (07/22 0319) BP: (105-117)/(56-82) 105/56 (07/22 0319) SpO2:  [96 %-100 %] 96 % (07/22 0319) Weight:  [109 kg] 109 kg (07/22 0420)     Height: 4\' 11"  (149.9 cm) Weight: 109 kg BMI (Calculated): 46.02   Intake/Output last 2 shifts:  07/21 0701 - 07/22 0700 In: 490 [P.O.:480; I.V.:10] Out: 250 [Stool:250]   Physical Exam:  Constitutional: alert, cooperative and no distress  Respiratory: breathing non-labored at rest  Cardiovascular: regular rate and sinus rhythm  Gastrointestinal: Soft, non-tender, non-distended, no rebound/guarding. Integumentary: Midline wound healing by secondary intention, peritoneum closed; granulating and new skin growing over majority of wound now; there are three stomatized areas visible in the LUQ and LLQ portions of the wound. No active bleeding. Piece of ST mesh noted.  Chest: Port to the right chest; no further swelling or drainage; no evidence of infection   Labs:     Latest Ref Rng & Units 03/26/2023    8:32 AM 03/21/2023    6:27 PM 03/20/2023     7:03 AM  CBC  WBC 4.0 - 10.5 K/uL 2.7  4.9  2.5   Hemoglobin 12.0 - 15.0 g/dL 8.0  9.7  7.1   Hematocrit 36.0 - 46.0 % 23.8  29.1  22.1   Platelets 150 - 400 K/uL 63  81  70       Latest Ref Rng & Units 03/23/2023    7:09 AM 03/19/2023    8:06 AM 03/16/2023    6:23 AM  CMP  Glucose 70 - 99 mg/dL 960  454  098   BUN 6 - 20 mg/dL 24  23  21    Creatinine 0.44 - 1.00 mg/dL 1.19  1.47  8.29   Sodium 135 - 145 mmol/L 136  136  134   Potassium 3.5 - 5.1 mmol/L 3.7  3.5  3.8   Chloride 98 - 111 mmol/L 105  104  104   CO2 22 - 32 mmol/L 26  26  25    Calcium 8.9 - 10.3 mg/dL 7.9  8.0  7.9   Total Protein 6.5 - 8.1 g/dL 5.8  5.5  5.7   Total Bilirubin 0.3 - 1.2 mg/dL 0.7  0.5  0.6   Alkaline Phos 38 - 126 U/L 162  171  171   AST 15 - 41 U/L 53  49  51   ALT 0 - 44 U/L 37  34  35     Imaging studies: No new imaging studies this AM   Assessment/Plan:  57 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  - New 07/22: No acute change over the weekend. No issues this morning. Nutritional labs are pending. Plan for CT Abdomen/Pelvis for surgical planning on Wednesday 07/24  - Wound Care ([name] Pouch); [name] pouch; continues managing effluent independently. Intermittent oozing from time to time. Change as needed.    - Continue soft diet as tolerated  - Continue cyclic TPN; weekly nutritional labs - Appreciate dietary assistance - monitoring weight             - Monitor abdominal examination; on-going bowel function            - Pain control prn; antemetic prn            - Progressed with therapies; no longer any recommendations; ambulating well    - Discharge Planning:  Plan for definitive repair on 08/01   All of the above findings and recommendations were discussed with the patient, and the medical team, and all of patient's questions were answered to her expressed satisfaction.  -- [name], PA-C [facility] 03/30/2023, 8:33 AM M-F: 7am - 4pm  Pt seen and examined. Some dark brown effluent noted. Pt w some cough. CXR to be done today. No other changes

## 2023-03-30 NOTE — TOC Progression Note (Addendum)
Transition of Care [facility]) - Progression Note    Patient Details  Name: Debra [name] MRN: [id number] Date of Birth: 1965-09-22  [facility]) CM/SW Contact  Truddie Hidden, RN Phone Number: 03/30/2023, 10:43 AM  Clinical Narrative:    TOC assessing for ongoing needs and discharge planning.   Expected Discharge Plan: Home w Home Health Services Barriers to Discharge: Continued Medical Work up  Expected Discharge Plan and Services                                               Social Determinants of Health (SDOH) Interventions SDOH Screenings   Food Insecurity: No Food Insecurity (05/20/2022)  Housing: Low Risk  (05/20/2022)  Transportation Needs: No Transportation Needs (05/20/2022)  Utilities: Not At Risk (05/20/2022)  Tobacco Use: Low Risk  (05/19/2022)    Readmission Risk Interventions     No data to display

## 2023-03-30 NOTE — Plan of Care (Signed)
The patient was complaining of dark blood inside ostomy pouch and complained of throat pain.The patient reports that pain has improved. Call Problem: Clinical Measurements: Goal: Ability to maintain clinical measurements within normal limits will improve Outcome: Progressing Goal: Will remain free from infection Outcome: Progressing Goal: Diagnostic test results will improve Outcome: Progressing Goal: Respiratory complications will improve Outcome: Progressing Goal: Cardiovascular complication will be avoided Outcome: Progressing   Problem: Clinical Measurements: Goal: Will remain free from infection Outcome: Progressing   Problem: Nutrition: Goal: Adequate nutrition will be maintained Outcome: Progressing   Problem: Activity: Goal: Risk for activity intolerance will decrease Outcome: Progressing   Problem: Nutrition: Goal: Adequate nutrition will be maintained Outcome: Progressing   Problem: Coping: Goal: Level of anxiety will decrease Outcome: Progressing   Problem: Pain Managment: Goal: General experience of comfort will improve Outcome: Progressing

## 2023-03-31 LAB — MISC LABCORP TEST (SEND OUT): Labcorp test code: 716910

## 2023-03-31 LAB — GLUCOSE, CAPILLARY
Glucose-Capillary: 224 mg/dL — ABNORMAL HIGH (ref 70–99)
Glucose-Capillary: 230 mg/dL — ABNORMAL HIGH (ref 70–99)
Glucose-Capillary: 203 mg/dL — ABNORMAL HIGH (ref 70–99)
Glucose-Capillary: 175 mg/dL — ABNORMAL HIGH (ref 70–99)
Glucose-Capillary: 163 mg/dL — ABNORMAL HIGH (ref 70–99)
Glucose-Capillary: 146 mg/dL — ABNORMAL HIGH (ref 70–99)

## 2023-03-31 LAB — CBC
HCT: 26 % — ABNORMAL LOW (ref 36.0–46.0)
Hemoglobin: 8.2 g/dL — ABNORMAL LOW (ref 12.0–15.0)
MCH: 27.7 pg (ref 26.0–34.0)
MCHC: 31.5 g/dL (ref 30.0–36.0)
MCV: 87.8 fL (ref 80.0–100.0)
Platelets: 72 10*3/uL — ABNORMAL LOW (ref 150–400)
RBC: 2.96 MIL/uL — ABNORMAL LOW (ref 3.87–5.11)
RDW: 16.3 % — ABNORMAL HIGH (ref 11.5–15.5)
WBC: 3.3 10*3/uL — ABNORMAL LOW (ref 4.0–10.5)
nRBC: 0 % (ref 0.0–0.2)

## 2023-03-31 LAB — COPPER, SERUM: Copper: 93 ug/dL (ref 80–158)

## 2023-03-31 LAB — ZINC: Zinc: 67 ug/dL (ref 44–115)

## 2023-03-31 MED ORDER — FLUTICASONE PROPIONATE 50 MCG/ACT NA SUSP
2.0000 | Freq: Two times a day (BID) | NASAL | Status: DC | PRN
  Administered 2023-04-01: 2 via NASAL
  Filled 2023-03-31: qty 16

## 2023-03-31 MED ORDER — ZINC CHLORIDE 1 MG/ML IV SOLN
INTRAVENOUS | Status: AC
  Filled 2023-03-31: qty 725

## 2023-03-31 MED ORDER — SODIUM CHLORIDE 0.9 % IV SOLN
300.0000 mg | INTRAVENOUS | Status: AC
  Administered 2023-04-01 – 2023-04-03 (×3): 300 mg via INTRAVENOUS
  Filled 2023-03-31 (×3): qty 300

## 2023-03-31 MED ORDER — VITAMIN D (ERGOCALCIFEROL) 1.25 MG (50000 UNIT) PO CAPS
50000.0000 [IU] | ORAL_CAPSULE | ORAL | Status: DC
  Administered 2023-04-01 – 2023-04-22 (×4): 50000 [IU] via ORAL
  Filled 2023-03-31 (×5): qty 1

## 2023-03-31 NOTE — Progress Notes (Signed)
[facility] SURGICAL PROGRESS NOTE   Hospital Day(s): 411.   Post op day(s): 102 Days Post-Op.   Interval History:  Patient seen and examined Nothing acute overnight Sitting up in chair No further bleeding at this moment; felt weak from this Repeat CBC pending [name] output unmeasured in last 24 hours; managing effluent independently On cyclic TPN + soft diet  Ambulating well; independent  Review of Systems:  Constitutional: denies fever, chills  HEENT: denies cough or congestion  Respiratory: denies any shortness of breath  Cardiovascular: denies chest pain or palpitations  Gastrointestinal: denied abdominal pain, denied N/V Genitourinary: denies burning with urination or urinary frequency Integumentary: + midline wound (healing; stable)  Vital signs in last 24 hours: [min-max] current  Temp:  [98.1 F (36.7 C)-99.1 F (37.3 C)] 98.3 F (36.8 C) (07/23 0409) Pulse Rate:  [64-91] 77 (07/23 0409) Resp:  [16-20] 18 (07/23 0409) BP: (102-113)/(53-64) 102/61 (07/23 0409) SpO2:  [94 %-99 %] 99 % (07/23 0409) Weight:  [108.7 kg] 108.7 kg (07/23 0410)     Height: 4\' 11"  (149.9 cm) Weight: 108.7 kg BMI (Calculated): 46.02   Intake/Output last 2 shifts:  No intake/output data recorded.   Physical Exam:  Constitutional: alert, cooperative and no distress  Respiratory: breathing non-labored at rest  Cardiovascular: regular rate and sinus rhythm  Gastrointestinal: Soft, non-tender, non-distended, no rebound/guarding. Integumentary: Midline wound healing by secondary intention, peritoneum closed; granulating and new skin growing over majority of wound now; there are three stomatized areas visible in the LUQ and LLQ portions of the wound. No active bleeding this morning  Chest: Port to the right chest   Labs:     Latest Ref Rng & Units 03/30/2023    8:54 AM 03/26/2023    8:32 AM 03/21/2023    6:27 PM  CBC  WBC 4.0 - 10.5 K/uL 2.8  2.7  4.9   Hemoglobin 12.0 -  15.0 g/dL 7.6  8.0  9.7   Hematocrit 36.0 - 46.0 % 22.6  23.8  29.1   Platelets 150 - 400 K/uL 64  63  81       Latest Ref Rng & Units 03/30/2023    8:54 AM 03/23/2023    7:09 AM 03/19/2023    8:06 AM  CMP  Glucose 70 - 99 mg/dL 161  096  045   BUN 6 - 20 mg/dL 24  24  23    Creatinine 0.44 - 1.00 mg/dL 4.09  8.11  9.14   Sodium 135 - 145 mmol/L 137  136  136   Potassium 3.5 - 5.1 mmol/L 3.6  3.7  3.5   Chloride 98 - 111 mmol/L 103  105  104   CO2 22 - 32 mmol/L 29  26  26    Calcium 8.9 - 10.3 mg/dL 7.8  7.9  8.0   Total Protein 6.5 - 8.1 g/dL 5.7  5.8  5.5   Total Bilirubin 0.3 - 1.2 mg/dL 0.6  0.7  0.5   Alkaline Phos 38 - 126 U/L 150  162  171   AST 15 - 41 U/L 55  53  49   ALT 0 - 44 U/L 38  37  34     Imaging studies: No new imaging studies this AM   Assessment/Plan:  57 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  - New 07/22: No bleeding at this moment, CBC pending; will follow up. Anticipate she will need transfusion again in next few days. Anticipate transfusion pre-operatively as well. Plan for CT Chest/Abdomen/Pelvis for surgical planning on Wednesday 07/24  - Wound Care ([name] Pouch); [name] pouch; continues managing effluent independently. Intermittent oozing from time to time. Change as needed.    - Continue soft diet as tolerated  - Continue cyclic TPN; weekly nutritional labs - Appreciate dietary assistance - monitoring weight             - Monitor abdominal examination; on-going bowel function            - Pain control prn; antemetic prn            - Progressed with therapies; no longer any recommendations; ambulating well    -  Discharge Planning: Plan for definitive repair on 08/01   All of the above findings and recommendations were discussed with the patient, and the medical team, and all of patient's questions were answered to her expressed satisfaction.  -- [name], PA-C [facility] 03/31/2023, 7:28 AM M-F: 7am - 4pm

## 2023-03-31 NOTE — Progress Notes (Addendum)
Nutrition Follow-up  DOCUMENTATION CODES:   Obesity unspecified  INTERVENTION:   Continue cyclic TPN per pharmacy (16 hrs)- provides 1811kcal/day and 102g/day protein   Ensure Enlive po BID, each supplement provides 350 kcal and 20 grams of protein.  Continue MVI, chromium and trace elements in TPN   Daily weights   Continue zinc additional 10mg  daily added to TPN (increased 6/21)  Continue Selenium additional daily added to TPN (increased 6/21)  Vitamin A 20,000 units po daily   Add Ergocalciferol 50,000 units po weekly x 6 weeks  Repeat iron sucrose 300mg  IV daily x 3 days   Will check routine vitamin labs every 3 months, next due 10/21  NUTRITION DIAGNOSIS:   Increased nutrient needs related to wound healing, catabolic illness as evidenced by estimated needs. -ongoing   GOAL:   Patient will meet greater than or equal to 90% of their needs -met with TPN   MONITOR:   PO intake, Supplement acceptance, Labs, Weight trends, Diet advancement, I & O's, TPN  ASSESSMENT:   57 y/o female with h/o hypothyroidism, COVID 19 (3/21), kidney stones and stage 3 colon cancer (s/p left hemicolectomy 5/21 and chemotherapy) who is admitted for new pelvic mass now s/p laparotomy 6/8 (with excision of pelvic mass from greater omentum, abdominal wall reconstruction with bilateral myocutaneous flaps and mesh, incisional hernia repair, appendectomy repair and VAC placement) complicated by bowel perforation s/p reopening of recent laparotomy 6/10 (with repair of small bowel perforation, excision of mesh, placement of two phasix mesh and VAC placement). Pathology returned as metastatic adenocarcinoma. Pt with L1 compression fracture.   Pt s/p right IJ port 4/12  Pt continues to tolerate TPN well at goal rate; TPN is currently being cycled for 16 hrs via port. Triglycerides wnl. Pt weighed via standing weight today at 233.5lbs; pt remains up > 20lbs from her UBW but volume status is  improved after increasing lasix. Vitamin labs pending; vitamins being repleted as needed. Will check routine vitamin labs every 3 months as pt on chronic TPN (next due 10/21).  Pt continues to have poor oral intake; pt eating ~25% of meals. [name] pouch with unmeasured output. Plan is for definitive repair on 8/1.  Medications reviewed and include: lasix, insulin, synthroid, protonix, vitamin A, TPN  -Selenium- pend, zinc- pend, iodine- pend, chromium- pend, copper- pend, manganese- pend- 7/22 -vitamin D- 39.67 wnl, vitamin A -pend, -Vitamin B1- pend, B6- pend, vitamin C- pend, vitamin E- pend, vitamin K- pend- 7/22 -Ferritin 21, folate 14.8 wnl, B12- 870 wnl- 7/22 -carnitine 32.0 wnl- 4/12 -aluminum 10(H)- 4/12 --Iron 25(L), TIBC 398- 4/12  Labs reviewed: Na 137 wnl, K 3.6 wnl, BUN 24(H), P 2.7 wnl, Mg 2.3 wnl- 7/22 Triglycerides- 135- wnl- 6/24 Wbc- 3.3(L), Hgb 8.2(L), Hct 26.0(L)- 7/23 Cbgs- 203, 230, 224 x 24 hrs  Nutrition Focused Physical Exam:  Flowsheet Row Most Recent Value  Orbital Region No depletion  Upper Arm Region No depletion  Thoracic and Lumbar Region No depletion  Buccal Region No depletion  Temple Region No depletion  Clavicle Bone Region No depletion  Clavicle and Acromion Bone Region No depletion  Scapular Bone Region No depletion  Dorsal Hand No depletion  Patellar Region No depletion  Anterior Thigh Region No depletion  Posterior Calf Region No depletion  Edema (RD Assessment) Mild  Hair Reviewed  Eyes Reviewed  Mouth Reviewed  Skin Reviewed  Nails Reviewed   Diet Order:    Diet Order  DIET SOFT Fluid consistency: Thin  Diet effective now                  EDUCATION NEEDS:   Not appropriate for education at this time  Skin:  Skin Assessment: Reviewed RN Assessment (EC fistula)  Last BM:  7/22  Height:   Ht Readings from Last 1 Encounters:  12/19/22 4\' 11"  (1.499 m)    Weight:   Wt Readings from Last 1 Encounters:   03/31/23 105.9 kg    Ideal Body Weight:  44.3 kg  BMI:  Body mass index is 47.16 kg/m.  Estimated Nutritional Needs:   Kcal:  1800-2100kcal/day  Protein:  90-110g/d  Fluid:  1.4-1.6L/day  Betsey Holiday MS, RD, LDN Please refer to [facility] for RD and/or RD on-call/weekend/after hours pager

## 2023-03-31 NOTE — Progress Notes (Signed)
PHARMACY - TOTAL PARENTERAL NUTRITION CONSULT NOTE   Indication: Prolonged ileus  Patient Measurements: Height: 4\' 11"  (149.9 cm) Weight: 108.7 kg (239 lb 10.2 oz) IBW/kg (Calculated) : 43.2 TPN AdjBW (KG): 57.8 Body mass index is 48.4 kg/m.  Assessment: Debra [name] is a 57 y.o. female s/p laparotomy, excision of greater omental mass, abdominal wall reconstruction with [name], appendectomy, and placement of Prevena vac.  Glucose / Insulin:  --No apparent history of diabetes --mSSI 4x/day (0500, 1300, 1800, 2200) + 13 units insulin in TPN --BG last 24h: 113-224 --SSI last 24h: 10 units Electrolytes: WNL Renal: SCr stable at baseline Hepatic: Alk Phos 162>>150, AST 55, ALT wnl, TBili Stable wnl GI Imaging: 9/11 CTAP: no new acute issues GI Surgeries / Procedures: s/p laparotomy, excision of greater omental mass, abdominal wall reconstruction with [name], appendectomy, and placement of Prevena vac 12/19/22 placement of right IJ port-a-catheter with dual reservoirs also s/p re-opening of laparotomy for repair of small bowel perforation   Central access: 02/15/2022 TPN start date: 02/15/2022  Nutritional Goals: Goal cyclic TPN over 16 hrs: cyclic TPN over 16 hrs: (provides 102 g of protein and 1811 kcals per day) total volume over 16hrs for calculations=1400 ml (1500 ml total with overfill)  RD Assessment:  Estimated Needs Total Energy Estimated Needs: 1800-2100kcal/day Total Protein Estimated Needs: 90-110g/d Total Fluid Estimated Needs: 1.4-1.6L/day  Current Nutrition:  Soft diet + nutritional supplements, not meeting PO needs  Plan:  Transitioned to *Cyclic* TPN on 10/07/84.   to run over 16 hours per MD request. [name] at 2000 per discussion with dietician to allow patient time for walking in afternoon/evenings.  To run over 16 hours: -Start rate at 49 mL/hr for 1 hour. -Increase rate to 98 mL/hr for 13 hours.  -Decrease rate to 49  mL/hr for 1 hour. -Decrease rate to 25 mL/hr for 1 hour, then stop.  Plan:  See cyclic TPN rate above Nutritional Components Amino acids (using 15% Clinisol): 108.75 g Dextrose 17% = 246 g  Lipids (using 20% SMOFlipids): 52.2 g kCal: 1795/24h  -7/13: Adjusted Dextrose from 19% to 17% and Amino acids from 98g to 108g  and total volume from to  to see if helps with blood sugars Electrolytes in TPN: Na 75 mEq/L, K 50 mEq/L, Ca 68mEq/L, Mg 10 mEq/L, Phos 10 mmol/L, Cl:Ac 1:1 Resume MVI, trace elements, chromium, zinc 10 mg, and selenium 150 mcg per dietary on 03/30/23 Continue CBG/SSI for Cyclic TPN:  -CBG 2 hrs after cyclic TPN start -CBG during middle of cyclic TPN -CBG 1 hr after cylic TPN stopped -CBG while off TPN Continue SSI insulin at moderate scale and 13u insulin in TPN Check TPN labs weekly on Mondays and more frequently if indicated  Barrie Folk, PharmD Clinical Pharmacist 03/31/2023 8:28 AM

## 2023-03-31 NOTE — Plan of Care (Signed)
  Problem: Clinical Measurements: Goal: Ability to maintain clinical measurements within normal limits will improve Outcome: Progressing Goal: Will remain free from infection Outcome: Progressing Goal: Diagnostic test results will improve Outcome: Progressing Goal: Respiratory complications will improve Outcome: Progressing Goal: Cardiovascular complication will be avoided Outcome: Progressing   Problem: Clinical Measurements: Goal: Will remain free from infection Outcome: Progressing   Problem: Nutrition: Goal: Adequate nutrition will be maintained Outcome: Progressing   Problem: Activity: Goal: Risk for activity intolerance will decrease Outcome: Progressing   Problem: Nutrition: Goal: Adequate nutrition will be maintained Outcome: Progressing   Problem: Coping: Goal: Level of anxiety will decrease Outcome: Progressing   Problem: Pain Managment: Goal: General experience of comfort will improve Outcome: Progressing   

## 2023-03-31 NOTE — Plan of Care (Signed)
PNR tyleno given. Call cell at reach. TPN in place.  Problem: Clinical Measurements: Goal: Ability to maintain clinical measurements within normal limits will improve Outcome: Not Progressing Goal: Will remain free from infection Outcome: Not Progressing Goal: Diagnostic test results will improve Outcome: Not Progressing Goal: Respiratory complications will improve Outcome: Not Progressing Goal: Cardiovascular complication will be avoided Outcome: Not Progressing   Problem: Clinical Measurements: Goal: Will remain free from infection Outcome: Not Progressing   Problem: Nutrition: Goal: Adequate nutrition will be maintained Outcome: Not Progressing   Problem: Activity: Goal: Risk for activity intolerance will decrease Outcome: Not Progressing   Problem: Nutrition: Goal: Adequate nutrition will be maintained Outcome: Not Progressing   Problem: Coping: Goal: Level of anxiety will decrease Outcome: Not Progressing   Problem: Pain Managment: Goal: General experience of comfort will improve Outcome: Not Progressing

## 2023-04-01 ENCOUNTER — Inpatient Hospital Stay: Payer: Medicaid Other

## 2023-04-01 LAB — GLUCOSE, CAPILLARY
Glucose-Capillary: 220 mg/dL — ABNORMAL HIGH (ref 70–99)
Glucose-Capillary: 162 mg/dL — ABNORMAL HIGH (ref 70–99)
Glucose-Capillary: 144 mg/dL — ABNORMAL HIGH (ref 70–99)
Glucose-Capillary: 166 mg/dL — ABNORMAL HIGH (ref 70–99)
Glucose-Capillary: 148 mg/dL — ABNORMAL HIGH (ref 70–99)
Glucose-Capillary: 100 mg/dL — ABNORMAL HIGH (ref 70–99)

## 2023-04-01 LAB — VITAMIN C: Vitamin C: 0.4 mg/dL (ref 0.4–2.0)

## 2023-04-01 LAB — MISC LABCORP TEST (SEND OUT)

## 2023-04-01 LAB — VITAMIN B1: Vitamin B1 (Thiamine): 70.3 nmol/L (ref 66.5–200.0)

## 2023-04-01 MED ORDER — IOHEXOL 9 MG/ML PO SOLN
1000.0000 mL | Freq: Once | ORAL | Status: AC | PRN
  Administered 2023-05-06: 1000 mL via ORAL

## 2023-04-01 MED ORDER — IOHEXOL 300 MG/ML  SOLN
100.0000 mL | Freq: Once | INTRAMUSCULAR | Status: AC | PRN
  Administered 2023-04-01: 100 mL via INTRAVENOUS

## 2023-04-01 MED ORDER — ZINC CHLORIDE 1 MG/ML IV SOLN
INTRAVENOUS | Status: AC
  Filled 2023-04-01: qty 725

## 2023-04-01 MED ORDER — IOHEXOL 9 MG/ML PO SOLN
500.0000 mL | ORAL | Status: AC
  Administered 2023-04-01: 500 mL via ORAL

## 2023-04-01 NOTE — Plan of Care (Signed)
The patient is stable at this time. Call bell at reach. The patient ambulates independently.   Problem: Clinical Measurements: Goal: Will remain free from infection Outcome: Progressing   Problem: Clinical Measurements: Goal: Ability to maintain clinical measurements within normal limits will improve Outcome: Progressing Goal: Will remain free from infection Outcome: Progressing Goal: Diagnostic test results will improve Outcome: Progressing Goal: Respiratory complications will improve Outcome: Progressing Goal: Cardiovascular complication will be avoided Outcome: Progressing   Problem: Nutrition: Goal: Adequate nutrition will be maintained Outcome: Progressing   Problem: Activity: Goal: Risk for activity intolerance will decrease Outcome: Progressing   Problem: Nutrition: Goal: Adequate nutrition will be maintained Outcome: Progressing   Problem: Coping: Goal: Level of anxiety will decrease Outcome: Progressing   Problem: Pain Managment: Goal: General experience of comfort will improve Outcome: Progressing

## 2023-04-01 NOTE — Progress Notes (Signed)
PHARMACY - TOTAL PARENTERAL NUTRITION CONSULT NOTE   Indication: Prolonged ileus  Patient Measurements: Height: 4\' 11"  (149.9 cm) Weight: 108.1 kg (238 lb 5.1 oz) IBW/kg (Calculated) : 43.2 TPN AdjBW (KG): 57.8 Body mass index is 48.13 kg/m.  Assessment: Debra [name] is a 57 y.o. female s/p laparotomy, excision of greater omental mass, abdominal wall reconstruction with [name], appendectomy, and placement of Prevena vac.  Glucose / Insulin:  --No apparent history of diabetes --mSSI 4x/day (0500, 1300, 1800, 2200) + 15 units insulin in TPN (increased 7/24) --BG last 24h: 146-230 --SSI last 24h: 15 units Electrolytes: WNL Renal: SCr stable at baseline Hepatic: Alk Phos 162>>150, AST 55, ALT wnl, TBili Stable wnl GI Imaging: 9/11 CTAP: no new acute issues GI Surgeries / Procedures: s/p laparotomy, excision of greater omental mass, abdominal wall reconstruction with [name], appendectomy, and placement of Prevena vac 12/19/22 placement of right IJ port-a-catheter with dual reservoirs also s/p re-opening of laparotomy for repair of small bowel perforation   Central access: 02/15/2022 TPN start date: 02/15/2022  Nutritional Goals: Goal cyclic TPN over 16 hrs: cyclic TPN over 16 hrs: (provides 102 g of protein and 1811 kcals per day) total volume over 16hrs for calculations=1400 ml (1500 ml total with overfill)  RD Assessment:  Estimated Needs Total Energy Estimated Needs: 1800-2100kcal/day Total Protein Estimated Needs: 90-110g/d Total Fluid Estimated Needs: 1.4-1.6L/day  Current Nutrition:  Soft diet + nutritional supplements, not meeting PO needs  Plan:  Transitioned to *Cyclic* TPN on 1/61/09.   to run over 16 hours per MD request. [name] at 2000 per discussion with dietician to allow patient time for walking in afternoon/evenings.  To run over 16 hours: -Start rate at 49 mL/hr for 1 hour. -Increase rate to 98 mL/hr for 13 hours.   -Decrease rate to 49 mL/hr for 1 hour. -Decrease rate to 25 mL/hr for 1 hour, then stop.  Plan:  See cyclic TPN rate above Nutritional Components Amino acids (using 15% Clinisol): 108.75 g Dextrose 17% = 246 g  Lipids (using 20% SMOFlipids): 52.2 g kCal: 1795/24h  -7/13: Adjusted Dextrose from 19% to 17% and Amino acids from 98g to 108g  and total volume from to  to see if helps with blood sugars Electrolytes in TPN: Na 75 mEq/L, K 50 mEq/L, Ca 68mEq/L, Mg 10 mEq/L, Phos 10 mmol/L, Cl:Ac 1:1 Resume MVI, trace elements, chromium, zinc 10 mg, and selenium 150 mcg per dietary on 03/30/23 Continue CBG/SSI for Cyclic TPN:  -CBG 2 hrs after cyclic TPN start -CBG during middle of cyclic TPN -CBG 1 hr after cylic TPN stopped -CBG while off TPN Continue SSI insulin at moderate scale and 15u insulin in TPN (increased 7/24) Check TPN labs weekly on Mondays and more frequently if indicated  Barrie Folk, PharmD Clinical Pharmacist 04/01/2023 8:04 AM

## 2023-04-01 NOTE — Plan of Care (Signed)
  Problem: Clinical Measurements: Goal: Ability to maintain clinical measurements within normal limits will improve Outcome: Progressing Goal: Will remain free from infection Outcome: Progressing Goal: Diagnostic test results will improve Outcome: Progressing Goal: Respiratory complications will improve Outcome: Progressing Goal: Cardiovascular complication will be avoided Outcome: Progressing   Problem: Clinical Measurements: Goal: Will remain free from infection Outcome: Progressing   Problem: Nutrition: Goal: Adequate nutrition will be maintained Outcome: Progressing   Problem: Activity: Goal: Risk for activity intolerance will decrease Outcome: Progressing   Problem: Nutrition: Goal: Adequate nutrition will be maintained Outcome: Progressing   Problem: Coping: Goal: Level of anxiety will decrease Outcome: Progressing   Problem: Pain Managment: Goal: General experience of comfort will improve Outcome: Progressing   

## 2023-04-01 NOTE — Progress Notes (Signed)
[facility] SURGICAL PROGRESS NOTE   Hospital Day(s): 412.   Post op day(s): 103 Days Post-Op.   Interval History:  Patient seen and examined Nothing acute overnight Feels more congestion this morning; added Flonase last night also with Cepacol lozenges No further bleeding from earlier this week; Hgb 8.2 [name] output unmeasured in last 24 hours; managing effluent independently On cyclic TPN + soft diet  Ambulating well; independent CT C/A/P today  Review of Systems:  Constitutional: denies fever, chills  HEENT: denies cough or congestion  Respiratory: denies any shortness of breath  Cardiovascular: denies chest pain or palpitations  Gastrointestinal: denied abdominal pain, denied N/V Genitourinary: denies burning with urination or urinary frequency Integumentary: + midline wound (healing; stable)  Vital signs in last 24 hours: [min-max] current  Temp:  [98.2 F (36.8 C)-98.8 F (37.1 C)] 98.8 F (37.1 C) (07/24 0421) Pulse Rate:  [79-91] 80 (07/24 0421) Resp:  [18-20] 20 (07/24 0421) BP: (109-128)/(59-82) 113/59 (07/24 0421) SpO2:  [95 %-100 %] 95 % (07/24 0421) Weight:  [105.9 kg-108.1 kg] 108.1 kg (07/24 0422)     Height: 4\' 11"  (149.9 cm) Weight: 108.1 kg BMI (Calculated): 46.02   Intake/Output last 2 shifts:  07/23 0701 - 07/24 0700 In: 471.4 [I.V.:471.4] Out: 3 [Urine:3]   Physical Exam:  Constitutional: alert, cooperative and no distress  Respiratory: breathing non-labored at rest  Cardiovascular: regular rate and sinus rhythm  Gastrointestinal: Soft, non-tender, non-distended, no rebound/guarding. Integumentary: Midline wound healing by secondary intention, peritoneum closed; granulating and new skin growing over majority of wound now; there are three stomatized areas visible in the LUQ and LLQ portions of the wound. No active bleeding this morning  Chest: Port to the right chest   Labs:     Latest Ref Rng & Units 03/31/2023    8:34 AM  03/30/2023    8:54 AM 03/26/2023    8:32 AM  CBC  WBC 4.0 - 10.5 K/uL 3.3  2.8  2.7   Hemoglobin 12.0 - 15.0 g/dL 8.2  7.6  8.0   Hematocrit 36.0 - 46.0 % 26.0  22.6  23.8   Platelets 150 - 400 K/uL 72  64  63       Latest Ref Rng & Units 03/30/2023    8:54 AM 03/23/2023    7:09 AM 03/19/2023    8:06 AM  CMP  Glucose 70 - 99 mg/dL 401  027  253   BUN 6 - 20 mg/dL 24  24  23    Creatinine 0.44 - 1.00 mg/dL 6.64  4.03  4.74   Sodium 135 - 145 mmol/L 137  136  136   Potassium 3.5 - 5.1 mmol/L 3.6  3.7  3.5   Chloride 98 - 111 mmol/L 103  105  104   CO2 22 - 32 mmol/L 29  26  26    Calcium 8.9 - 10.3 mg/dL 7.8  7.9  8.0   Total Protein 6.5 - 8.1 g/dL 5.7  5.8  5.5   Total Bilirubin 0.3 - 1.2 mg/dL 0.6  0.7  0.5   Alkaline Phos 38 - 126 U/L 150  162  171   AST 15 - 41 U/L 55  53  49   ALT 0 - 44 U/L 38  37  34     Imaging studies:  CT Chest/Abdomen/Pelvis (04/02/2023) personally reviewed with noted small bowel fistulas to anterior abdominal wound, no toher intra-abdominal findings, ? Atelectasis in left lung, no gross nodules, and  radiologist report pending....   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  - New 07/24:  - CT Chest/Abdomen/Pelvis completed and reviewed; awaiting radiology read, no acute findings of concern.  - Hgb improved on repeat yesterday without bleeding. Will still likely need transfusion pre-op next week as precaution.  - Is complaining of URI symptoms, CXR reassuring; Flonase added overnight.   - Wound Care ([name] Pouch); [name] pouch; continues managing effluent independently. Intermittent oozing from time to time.  Change as needed.    - Continue soft diet as tolerated  - Continue cyclic TPN; weekly nutritional labs - Appreciate dietary assistance - monitoring weight             - Monitor abdominal examination; on-going bowel function            - Pain control prn; antemetic prn            - Progressed with therapies; no longer any recommendations; ambulating well    - Discharge Planning: Plan for definitive repair on 08/01   All of the above findings and recommendations were discussed with the patient, and the medical team, and all of patient's questions were answered to her expressed satisfaction.  -- [name], PA-C [facility] 04/01/2023, 7:27 AM M-F: 7am - 4pm

## 2023-04-01 NOTE — Progress Notes (Incomplete)
{  CHL IP ALL NUTRITION NOTES:[id number]} 

## 2023-04-02 LAB — COMPREHENSIVE METABOLIC PANEL
Albumin: 2.3 g/dL — ABNORMAL LOW (ref 3.5–5.0)
Alkaline Phosphatase: 150 U/L — ABNORMAL HIGH (ref 38–126)
CO2: 29 mmol/L (ref 22–32)
Calcium: 7.9 mg/dL — ABNORMAL LOW (ref 8.9–10.3)
Chloride: 102 mmol/L (ref 98–111)
Creatinine, Ser: 0.5 mg/dL (ref 0.44–1.00)
Glucose, Bld: 207 mg/dL — ABNORMAL HIGH (ref 70–99)
Potassium: 3.5 mmol/L (ref 3.5–5.1)
Sodium: 135 mmol/L (ref 135–145)
Total Bilirubin: 0.6 mg/dL (ref 0.3–1.2)
Total Protein: 5.9 g/dL — ABNORMAL LOW (ref 6.5–8.1)

## 2023-04-02 LAB — HEMOGLOBIN A1C
Hgb A1c MFr Bld: 6.8 % — ABNORMAL HIGH (ref 4.8–5.6)
Mean Plasma Glucose: 148.46 mg/dL

## 2023-04-02 LAB — GLUCOSE, CAPILLARY
Glucose-Capillary: 211 mg/dL — ABNORMAL HIGH (ref 70–99)
Glucose-Capillary: 194 mg/dL — ABNORMAL HIGH (ref 70–99)
Glucose-Capillary: 176 mg/dL — ABNORMAL HIGH (ref 70–99)
Glucose-Capillary: 140 mg/dL — ABNORMAL HIGH (ref 70–99)
Glucose-Capillary: 159 mg/dL — ABNORMAL HIGH (ref 70–99)

## 2023-04-02 LAB — MISC LABCORP TEST (SEND OUT): Labcorp test code: 70034

## 2023-04-02 LAB — VITAMIN B6: Vitamin B6: 16.4 ug/L (ref 3.4–65.2)

## 2023-04-02 LAB — MAGNESIUM: Magnesium: 2.3 mg/dL (ref 1.7–2.4)

## 2023-04-02 LAB — PROTIME-INR: Prothrombin Time: 16.8 seconds — ABNORMAL HIGH (ref 11.4–15.2)

## 2023-04-02 LAB — VITAMIN E
Vitamin E (Alpha Tocopherol): 11.8 mg/L (ref 7.0–25.1)
Vitamin E(Gamma Tocopherol): 0.8 mg/L (ref 0.5–5.5)

## 2023-04-02 LAB — PREALBUMIN: Prealbumin: 8 mg/dL — ABNORMAL LOW (ref 18–38)

## 2023-04-02 LAB — PHOSPHORUS: Phosphorus: 2.9 mg/dL (ref 2.5–4.6)

## 2023-04-02 LAB — VITAMIN A: Vitamin A (Retinoic Acid): 12.6 ug/dL — ABNORMAL LOW (ref 20.1–62.0)

## 2023-04-02 MED ORDER — ZINC CHLORIDE 1 MG/ML IV SOLN
INTRAVENOUS | Status: AC
  Filled 2023-04-02: qty 770

## 2023-04-02 MED ORDER — FUROSEMIDE 20 MG PO TABS
20.0000 mg | ORAL_TABLET | Freq: Two times a day (BID) | ORAL | Status: DC
  Administered 2023-04-03 – 2023-04-27 (×44): 20 mg via ORAL
  Filled 2023-04-02 (×48): qty 1

## 2023-04-02 MED ORDER — VITAMIN A 3 MG (10000 UNIT) PO CAPS
30000.0000 [IU] | ORAL_CAPSULE | Freq: Every day | ORAL | Status: DC
  Administered 2023-04-03 – 2023-04-27 (×22): 30000 [IU] via ORAL
  Filled 2023-04-02 (×29): qty 3

## 2023-04-02 NOTE — Progress Notes (Signed)
PHARMACY - TOTAL PARENTERAL NUTRITION CONSULT NOTE   Indication: Prolonged ileus  Patient Measurements: Height: 4\' 11"  (149.9 cm) Weight: 108 kg (238 lb 1.6 oz) IBW/kg (Calculated) : 43.2 TPN AdjBW (KG): 57.8 Body mass index is 48.09 kg/m.  Assessment: Debra [name] is a 57 y.o. female s/p laparotomy, excision of greater omental mass, abdominal wall reconstruction with [name], appendectomy, and placement of Prevena vac.  Glucose / Insulin:  --No apparent history of diabetes --mSSI 4x/day (0500, 1300, 1800, 2200) --BG last 24h: 100-166 --SSI last 24h: 9 units Electrolytes: WNL Renal: SCr stable at baseline Hepatic: Alk Phos 162>>150, AST 55, ALT wnl, TBili Stable wnl GI Imaging: 9/11 CTAP: no new acute issues GI Surgeries / Procedures: s/p laparotomy, excision of greater omental mass, abdominal wall reconstruction with [name], appendectomy, and placement of Prevena vac 12/19/22 placement of right IJ port-a-catheter with dual reservoirs also s/p re-opening of laparotomy for repair of small bowel perforation   Central access: 02/15/2022 TPN start date: 02/15/2022  Nutritional Goals: Goal cyclic TPN over 16 hrs: cyclic TPN over 16 hrs: (provides 115.5 g of protein and 1614 kcals per day) total volume over 16hrs for calculations=1500 ml  RD Assessment:  Estimated Needs Total Energy Estimated Needs: 1800-2100kcal/day Total Protein Estimated Needs: 90-110g/d Total Fluid Estimated Needs: 1.4-1.6L/day  Current Nutrition:  Soft diet + nutritional supplements, not meeting PO needs  Plan:  Transitioned to *Cyclic* TPN on 1/61/09.   to run over 16 hours per MD request. [name] at 2000 per discussion with dietician to allow patient time for walking in afternoon/evenings.  To run over 16 hours: -Start rate at 53 mL/hr for 1 hour. -Increase rate to 105 mL/hr for 13 hours.  -Decrease rate to 53 mL/hr for 1 hour. -Decrease rate to 26 mL/hr for 1 hour,  then stop.  Plan:  See cyclic TPN rate above Nutritional Components Amino acids (using 15% Clinisol): 108.75 g Dextrose 17% = 246 g  Lipids (using 20% SMOFlipids): 52.2 g kCal: 1795/24h  -7/25: Adjusted Dextrose from 17% to 12% at physician request and increase Amino acids from 108g to 115.5g per dietary. Total volume increasing from to  to see if helps with blood sugars Electrolytes in TPN: Na 75 mEq/L, K 50 mEq/L, Ca 70mEq/L, Mg 10 mEq/L, Phos 10 mmol/L, Cl:Ac 1:1 Resume MVI, trace elements, chromium, zinc 10 mg, and selenium 150 mcg per dietary on 03/30/23 Continue CBG/SSI for Cyclic TPN:  -CBG 2 hrs after cyclic TPN start -CBG during middle of cyclic TPN -CBG 1 hr after cylic TPN stopped -CBG while off TPN Continue SSI insulin at moderate scale and remove insulin from bag due to significant decrease in dextrose content. Check TPN labs weekly on Mondays and more frequently if indicated  Barrie Folk, PharmD Clinical Pharmacist 04/02/2023 7:05 AM

## 2023-04-02 NOTE — Plan of Care (Signed)
  Problem: Clinical Measurements: Goal: Ability to maintain clinical measurements within normal limits will improve Outcome: Progressing Goal: Will remain free from infection Outcome: Progressing Goal: Diagnostic test results will improve Outcome: Progressing Goal: Respiratory complications will improve Outcome: Progressing Goal: Cardiovascular complication will be avoided Outcome: Progressing   Problem: Clinical Measurements: Goal: Will remain free from infection Outcome: Progressing   Problem: Nutrition: Goal: Adequate nutrition will be maintained Outcome: Progressing   Problem: Activity: Goal: Risk for activity intolerance will decrease Outcome: Progressing   Problem: Nutrition: Goal: Adequate nutrition will be maintained Outcome: Progressing   Problem: Coping: Goal: Level of anxiety will decrease Outcome: Progressing   Problem: Pain Managment: Goal: General experience of comfort will improve Outcome: Progressing   

## 2023-04-02 NOTE — Progress Notes (Signed)
Morning dose of iv lasix not given and provider ([place]) notified via secure chat. Patient has chest port with tpn infusing as her only access which cannot be interrupted to give other iv medications. Provider asked for oral lasix since patient has other oral medications.

## 2023-04-02 NOTE — Progress Notes (Signed)
Nutrition Follow-up  DOCUMENTATION CODES:   Obesity unspecified  INTERVENTION:   Continue cyclic TPN per pharmacy (16 hrs)- provides 1614kcal/day and 116g/day protein   Ensure Enlive po BID, each supplement provides 350 kcal and 20 grams of protein.  Continue MVI, chromium and trace elements in TPN   Daily weights   Continue zinc additional 10mg  daily added to TPN (increased 6/21)  Continue Selenium additional daily added to TPN (increased 6/21)  Increase Vitamin A to 30,000 units po daily (increased 7/25)   Ergocalciferol 50,000 units po weekly x 6 weeks (initiated 7/24)  Will check routine vitamin labs every 3 months, next due 10/21  NUTRITION DIAGNOSIS:   Increased nutrient needs related to wound healing, catabolic illness as evidenced by estimated needs. -ongoing   GOAL:   Patient will meet greater than or equal to 90% of their needs -met with TPN   MONITOR:   PO intake, Supplement acceptance, Labs, Weight trends, Diet advancement, I & O's, TPN  ASSESSMENT:   57 y/o female with h/o hypothyroidism, COVID 19 (3/21), kidney stones and stage 3 colon cancer (s/p left hemicolectomy 5/21 and chemotherapy) who is admitted for new pelvic mass now s/p laparotomy 6/8 (with excision of pelvic mass from greater omentum, abdominal wall reconstruction with bilateral myocutaneous flaps and mesh, incisional hernia repair, appendectomy repair and VAC placement) complicated by bowel perforation s/p reopening of recent laparotomy 6/10 (with repair of small bowel perforation, excision of mesh, placement of two phasix mesh and VAC placement). Pathology returned as metastatic adenocarcinoma. Pt with L1 compression fracture.   Pt s/p right IJ port 4/12  Met with pt in room. Family at bedside and is able to translate. Pt reports that she continues to feel ok. Pt's main complaint is fluid that she reports drains from her legs. Pt reports that she feels like the TPN is making her "have  too much fluid". Pt has remained up >20lbs over her UBW since May; PA and MD aware and pt is receiving lasix. Pt weighed 233.5lbs on a standing scale on 7/23. Pt's UBW is 205-210lbs.  Pt has been tolerating cycled TPN well. Triglycerides wnl. Blood glucoses have varied depending on the amount of insulin added to TPN. AIC checked and returned at 6.8%. Plan is for possible surgery next week. MD concerned about pt's elevated blood glucoses in relation to post op healing. MD requested to decrease the amount of dextrose in TPN to a concentration of 12%. RD concerned about decreasing pt's caloric intake prior to her scheduled surgery as TPN will now only meet 75-90% of pt's estimated calorie needs. RD recommendations and TPN changes were discussed with MD and pharmacy and it was decided to proceed with decreasing the dextrose content in TPN. Pt is eating some food by mouth. Pt reports eating a few bites of beans brought from home today. Pt has been eating ~25% of her hospital meals. Pt has been refusing most Ensure. RD is unsure how much nutrients are able to be absorbed secondary to fistulas. Pt has required high doses of oral vitamin supplementation to replete deficiencies. Vitamin labs being monitored and supplemented as needed.   Medications reviewed and include: lasix, insulin, synthroid, protonix, vitamin A, vitamin D, TPN, iron sucrose  -Selenium- 98 wnl, zinc- 67 wnl, iodine- 49.8 wnl, chromium- pend, copper- 93 wnl, manganese- pend- 7/22 -vitamin D- 39.67 wnl, vitamin A 12.6(L), -Vitamin B1- 70.3 wnl, B6- 16.4 wnl, vitamin C- 0.4 wnl, vitamin E- 11.8 wnl, vitamin K- pend- 7/22 -  Ferritin 21, folate 14.8 wnl, B12- 870 wnl- 7/22 -carnitine 32.0 wnl- 4/12 -aluminum 10(H)- 4/12 --Iron 25(L), TIBC 398- 4/12  Labs reviewed: Na 135 wnl, K 3.5 wnl, BUN 26(H), P 2.9 wnl, Mg 2.3 wnl Triglycerides- 135- wnl- 6/24 Wbc- 3.3(L), Hgb 8.2(L), Hct 26.0(L)- 7/23 Cbgs-  140, 176 x 24 hrs AIC 6.8(H)  Diet Order:     Diet Order             DIET SOFT Fluid consistency: Thin  Diet effective now                  EDUCATION NEEDS:   Not appropriate for education at this time  Skin:  Skin Assessment: Reviewed RN Assessment (EC fistula)  Last BM:  7/24  Height:   Ht Readings from Last 1 Encounters:  12/19/22 4\' 11"  (1.499 m)    Weight:   Wt Readings from Last 1 Encounters:  04/02/23 108 kg    Ideal Body Weight:  44.3 kg  BMI:  Body mass index is 48.09 kg/m.  Estimated Nutritional Needs:   Kcal:  1800-2100kcal/day  Protein:  90-110g/d  Fluid:  1.4-1.6L/day  Betsey Holiday MS, RD, LDN Please refer to [facility] for RD and/or RD on-call/weekend/after hours pager

## 2023-04-02 NOTE — Progress Notes (Signed)
[facility] SURGICAL PROGRESS NOTE   Hospital Day(s): 413.   Post op day(s): 104 Days Post-Op.   Interval History:  Patient seen and examined Nothing acute overnight Resting this morning No new labs this morning  [name] output 100 ccs + unmeasured in last 24 hours; managing effluent independently On cyclic TPN + soft diet  Ambulating well; independent  Review of Systems:  Constitutional: denies fever, chills  HEENT: denies cough or congestion  Respiratory: denies any shortness of breath  Cardiovascular: denies chest pain or palpitations  Gastrointestinal: denied abdominal pain, denied N/V Genitourinary: denies burning with urination or urinary frequency Integumentary: + midline wound (healing; stable)  Vital signs in last 24 hours: [min-max] current  Temp:  [98.1 F (36.7 C)-98.6 F (37 C)] 98.1 F (36.7 C) (07/25 0427) Pulse Rate:  [63-85] 85 (07/25 0427) Resp:  [18-20] 18 (07/25 0427) BP: (113-119)/(55-73) 113/55 (07/25 0427) SpO2:  [94 %-100 %] 100 % (07/25 0427) Weight:  [295 kg] 108 kg (07/25 0500)     Height: 4\' 11"  (149.9 cm) Weight: 108 kg BMI (Calculated): 46.02   Intake/Output last 2 shifts:  07/24 0701 - 07/25 0700 In: 913.7 [I.V.:648.6; IV Piggyback:265.2] Out: 100 [Stool:100]   Physical Exam:  Constitutional: alert, cooperative and no distress  Respiratory: breathing non-labored at rest  Cardiovascular: regular rate and sinus rhythm  Gastrointestinal: Soft, non-tender, non-distended, no rebound/guarding. Integumentary: Midline wound healing by secondary intention, peritoneum closed; granulating and new skin growing over majority of wound now; there are three stomatized areas visible in the LUQ and LLQ portions of the wound. No active bleeding this morning  Chest: Port to the right chest   Labs:     Latest Ref Rng & Units 03/31/2023    8:34 AM 03/30/2023    8:54 AM 03/26/2023    8:32 AM  CBC  WBC 4.0 - 10.5 K/uL 3.3  2.8  2.7    Hemoglobin 12.0 - 15.0 g/dL 8.2  7.6  8.0   Hematocrit 36.0 - 46.0 % 26.0  22.6  23.8   Platelets 150 - 400 K/uL 72  64  63       Latest Ref Rng & Units 03/30/2023    8:54 AM 03/23/2023    7:09 AM 03/19/2023    8:06 AM  CMP  Glucose 70 - 99 mg/dL 188  416  606   BUN 6 - 20 mg/dL 24  24  23    Creatinine 0.44 - 1.00 mg/dL 3.01  6.01  0.93   Sodium 135 - 145 mmol/L 137  136  136   Potassium 3.5 - 5.1 mmol/L 3.6  3.7  3.5   Chloride 98 - 111 mmol/L 103  105  104   CO2 22 - 32 mmol/L 29  26  26    Calcium 8.9 - 10.3 mg/dL 7.8  7.9  8.0   Total Protein 6.5 - 8.1 g/dL 5.7  5.8  5.5   Total Bilirubin 0.3 - 1.2 mg/dL 0.6  0.7  0.5   Alkaline Phos 38 - 126 U/L 150  162  171   AST 15 - 41 U/L 55  53  49   ALT 0 - 44 U/L 38  37  34     Imaging studies: No new imaging studies this morning    Assessment/Plan:  57 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  - New 07/25: CT Reviewed; no new issues.   - Wound Care ([name] Pouch); [name] pouch; continues managing effluent independently. Intermittent oozing from time to time. Change as needed.    - Continue soft diet as tolerated  - Continue cyclic TPN; weekly nutritional labs - Appreciate dietary assistance - monitoring weight             - Monitor abdominal examination; on-going bowel function            - Pain control prn; antemetic prn            - Progressed with therapies; no longer any recommendations; ambulating well    - Discharge Planning: Plan for definitive repair on 08/01   All of the above findings and recommendations were discussed with the patient, and the medical team, and all of patient's questions  were answered to her expressed satisfaction.  -- [name], PA-C [facility] 04/02/2023, 7:17 AM M-F: 7am - 4pm

## 2023-04-03 LAB — GLUCOSE, CAPILLARY
Glucose-Capillary: 156 mg/dL — ABNORMAL HIGH (ref 70–99)
Glucose-Capillary: 148 mg/dL — ABNORMAL HIGH (ref 70–99)
Glucose-Capillary: 127 mg/dL — ABNORMAL HIGH (ref 70–99)
Glucose-Capillary: 141 mg/dL — ABNORMAL HIGH (ref 70–99)

## 2023-04-03 MED ORDER — ZINC CHLORIDE 1 MG/ML IV SOLN
INTRAVENOUS | Status: AC
  Filled 2023-04-03: qty 770

## 2023-04-03 NOTE — TOC Progression Note (Signed)
Transition of Care [facility]) - Progression Note    Patient Details  Name: Debra [name] MRN: [id number] Date of Birth: [birth year]  [facility] CM/SW Contact  Truddie Hidden, RN Phone Number: 04/03/2023, 10:31 AM  Clinical Narrative:    TOC assessing for ongoing needs and discharge planning.   Expected Discharge Plan: Home w Home Health Services Barriers to Discharge: Continued Medical Work up  Expected Discharge Plan and Services                                               Social Determinants of Health (SDOH) Interventions SDOH Screenings   Food Insecurity: No Food Insecurity (05/20/2022)  Housing: Low Risk  (05/20/2022)  Transportation Needs: No Transportation Needs (05/20/2022)  Utilities: Not At Risk (05/20/2022)  Tobacco Use: Low Risk  (05/19/2022)    Readmission Risk Interventions     No data to display

## 2023-04-03 NOTE — Progress Notes (Signed)
PHARMACY - TOTAL PARENTERAL NUTRITION CONSULT NOTE   Indication: Prolonged ileus  Patient Measurements: Height: 4\' 11"  (149.9 cm) Weight: 108 kg (238 lb 1.6 oz) IBW/kg (Calculated) : 43.2 TPN AdjBW (KG): 57.8 Body mass index is 48.09 kg/m.  Assessment: Debra [name] is a 57 y.o. female s/p laparotomy, excision of greater omental mass, abdominal wall reconstruction with [name], appendectomy, and placement of Prevena vac.  Glucose / Insulin:  --No apparent history of diabetes --7/25 A1C 6.8 --mSSI 4x/day (0500, 1300, 1800, 2200) --BG last 24h: 140-207 --SSI last 24h: 8 units Electrolytes: WNL Renal: SCr stable at baseline Hepatic: Alk Phos 162>>150, AST 55, ALT wnl, TBili Stable wnl GI Imaging: 9/11 CTAP: no new acute issues GI Surgeries / Procedures: s/p laparotomy, excision of greater omental mass, abdominal wall reconstruction with [name], appendectomy, and placement of Prevena vac 12/19/22 placement of right IJ port-a-catheter with dual reservoirs also s/p re-opening of laparotomy for repair of small bowel perforation   Central access: 02/15/2022 TPN start date: 02/15/2022  Nutritional Goals: Goal cyclic TPN over 16 hrs: cyclic TPN over 16 hrs: (provides 115.5 g of protein and 1614 kcals per day) total volume over 16hrs for calculations=1500 ml  RD Assessment:  Estimated Needs Total Energy Estimated Needs: 1800-2100kcal/day Total Protein Estimated Needs: 90-110g/d Total Fluid Estimated Needs: 1.4-1.6L/day  Current Nutrition:  Soft diet + nutritional supplements, not meeting PO needs  Plan:  Transitioned to *Cyclic* TPN on 03/07/51.   to run over 16 hours per MD request. [name] at 2000 per discussion with dietician to allow patient time for walking in afternoon/evenings.  To run over 16 hours: -Start rate at 53 mL/hr for 1 hour. -Increase rate to 105 mL/hr for 13 hours.  -Decrease rate to 53 mL/hr for 1 hour. -Decrease rate to 26  mL/hr for 1 hour, then stop.  Plan:  See cyclic TPN rate above Nutritional Components Amino acids (using 15% Clinisol): 115.5 Dextrose 12% = 180 g  Lipids (using 20% SMOFlipids): 52.2 g kCal: 1614/24h  -7/25: Adjusted Dextrose from 17% to 12% at physician request and increase Amino acids from 108g to 115.5g per dietary. Total volume increasing from to  to see if helps with blood sugars Electrolytes in TPN: Na 75 mEq/L, K 50 mEq/L, Ca 63mEq/L, Mg 10 mEq/L, Phos 10 mmol/L, Cl:Ac 1:1 Resume MVI, trace elements, chromium, zinc 10 mg, and selenium 150 mcg per dietary on 03/30/23 Continue CBG/SSI for Cyclic TPN:  -CBG 2 hrs after cyclic TPN start -CBG during middle of cyclic TPN -CBG 1 hr after cylic TPN stopped -CBG while off TPN Continue SSI insulin at moderate scale and remove insulin from bag due to significant decrease in dextrose content. Check TPN labs weekly on Mondays and more frequently if indicated  Barrie Folk, PharmD Clinical Pharmacist 04/03/2023 8:25 AM

## 2023-04-03 NOTE — Progress Notes (Signed)
[facility] SURGICAL PROGRESS NOTE   Hospital Day(s): 414.   Post op day(s): 105 Days Post-Op.   Interval History:  Patient seen and examined Nothing acute overnight Resting comfortably this morning Congestion seems improved No new labs this morning  [name] output unmeasured in last 24 hours; managing effluent independently On cyclic TPN + soft diet  Ambulating well; independent  Review of Systems:  Constitutional: denies fever, chills  HEENT: denies cough or congestion  Respiratory: denies any shortness of breath  Cardiovascular: denies chest pain or palpitations  Gastrointestinal: denied abdominal pain, denied N/V Genitourinary: denies burning with urination or urinary frequency Integumentary: + midline wound (healing; stable)  Vital signs in last 24 hours: [min-max] current  Temp:  [97.9 F (36.6 C)-98.5 F (36.9 C)] 98.5 F (36.9 C) (07/26 0550) Pulse Rate:  [71-80] 80 (07/26 0550) Resp:  [16-20] 20 (07/26 0550) BP: (108-113)/(70-77) 113/77 (07/26 0550) SpO2:  [95 %-99 %] 97 % (07/26 0550)     Height: 4\' 11"  (149.9 cm) Weight: 108 kg BMI (Calculated): 46.02   Intake/Output last 2 shifts:  07/25 0701 - 07/26 0700 In: 635.8 [I.V.:635.8] Out: -    Physical Exam:  Constitutional: alert, cooperative and no distress  Respiratory: breathing non-labored at rest  Cardiovascular: regular rate and sinus rhythm  Gastrointestinal: Soft, non-tender, non-distended, no rebound/guarding. Integumentary: Midline wound healing by secondary intention, peritoneum closed; granulating and new skin growing over majority of wound now; there are three stomatized areas visible in the LUQ and LLQ portions of the wound. No active bleeding this morning  Chest: Port to the right chest   Labs:     Latest Ref Rng & Units 03/31/2023    8:34 AM 03/30/2023    8:54 AM 03/26/2023    8:32 AM  CBC  WBC 4.0 - 10.5 K/uL 3.3  2.8  2.7   Hemoglobin 12.0 - 15.0 g/dL 8.2  7.6  8.0    Hematocrit 36.0 - 46.0 % 26.0  22.6  23.8   Platelets 150 - 400 K/uL 72  64  63       Latest Ref Rng & Units 04/02/2023    8:56 AM 03/30/2023    8:54 AM 03/23/2023    7:09 AM  CMP  Glucose 70 - 99 mg/dL 161  096  045   BUN 6 - 20 mg/dL 26  24  24    Creatinine 0.44 - 1.00 mg/dL 4.09  8.11  9.14   Sodium 135 - 145 mmol/L 135  137  136   Potassium 3.5 - 5.1 mmol/L 3.5  3.6  3.7   Chloride 98 - 111 mmol/L 102  103  105   CO2 22 - 32 mmol/L 29  29  26    Calcium 8.9 - 10.3 mg/dL 7.9  7.8  7.9   Total Protein 6.5 - 8.1 g/dL 5.9  5.7  5.8   Total Bilirubin 0.3 - 1.2 mg/dL 0.6  0.6  0.7   Alkaline Phos 38 - 126 U/L 150  150  162   AST 15 - 41 U/L 55  55  53   ALT 0 - 44 U/L 39  38  37     Imaging studies: No new imaging studies this morning    Assessment/Plan:  57 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  - New 07/26: No acute change in last 24 hours; Again, plan for operative intervention next week.   - Wound Care ([name] Pouch); [name] pouch; continues managing effluent independently. Intermittent oozing from time to time. Change as needed.    - Continue soft diet as tolerated  - Continue cyclic TPN; weekly nutritional labs - Appreciate dietary assistance - monitoring weight             - Monitor abdominal examination; on-going bowel function            - Pain control prn; antemetic prn            - Progressed with therapies; no longer any recommendations; ambulating well    - Discharge Planning: Plan for definitive repair on 08/01   All of the above findings and recommendations were discussed with the patient, and the medical team, and all of patient's  questions were answered to her expressed satisfaction.  -- [name], PA-C [facility] 04/03/2023, 7:58 AM M-F: 7am - 4pm

## 2023-04-04 ENCOUNTER — Inpatient Hospital Stay: Payer: Medicaid Other

## 2023-04-04 LAB — GLUCOSE, CAPILLARY
Glucose-Capillary: 236 mg/dL — ABNORMAL HIGH (ref 70–99)
Glucose-Capillary: 203 mg/dL — ABNORMAL HIGH (ref 70–99)
Glucose-Capillary: 145 mg/dL — ABNORMAL HIGH (ref 70–99)
Glucose-Capillary: 217 mg/dL — ABNORMAL HIGH (ref 70–99)

## 2023-04-04 LAB — CBC
HCT: 26 % — ABNORMAL LOW (ref 36.0–46.0)
Hemoglobin: 8.2 g/dL — ABNORMAL LOW (ref 12.0–15.0)
MCH: 27.7 pg (ref 26.0–34.0)
MCHC: 31.5 g/dL (ref 30.0–36.0)
MCV: 87.8 fL (ref 80.0–100.0)
Platelets: 86 10*3/uL — ABNORMAL LOW (ref 150–400)
RBC: 2.96 MIL/uL — ABNORMAL LOW (ref 3.87–5.11)
RDW: 17 % — ABNORMAL HIGH (ref 11.5–15.5)
WBC: 8.1 10*3/uL (ref 4.0–10.5)
nRBC: 0.9 % — ABNORMAL HIGH (ref 0.0–0.2)

## 2023-04-04 LAB — BASIC METABOLIC PANEL
Anion gap: 8 (ref 5–15)
BUN: 23 mg/dL — ABNORMAL HIGH (ref 6–20)
CO2: 24 mmol/L (ref 22–32)
Calcium: 8.1 mg/dL — ABNORMAL LOW (ref 8.9–10.3)
Chloride: 104 mmol/L (ref 98–111)
Creatinine, Ser: 0.59 mg/dL (ref 0.44–1.00)
GFR, Estimated: >60 mL/min (ref >60)
Glucose, Bld: 214 mg/dL — ABNORMAL HIGH (ref 70–99)
Potassium: 3.7 mmol/L (ref 3.5–5.1)
Sodium: 136 mmol/L (ref 135–145)

## 2023-04-04 LAB — RESPIRATORY PANEL BY PCR

## 2023-04-04 LAB — MAGNESIUM: Magnesium: 2 mg/dL (ref 1.7–2.4)

## 2023-04-04 MED ORDER — ZINC CHLORIDE 1 MG/ML IV SOLN
INTRAVENOUS | Status: AC
  Filled 2023-04-04: qty 770

## 2023-04-04 NOTE — Progress Notes (Signed)
Notified provider that patient has a low grade fever, tachycardic,and tachypnea, advised provider that patient has been experiencing similar symptoms for the past 3 nights awaiting response for further interventions.

## 2023-04-04 NOTE — Plan of Care (Signed)
  Problem: Clinical Measurements: Goal: Ability to maintain clinical measurements within normal limits will improve Outcome: Progressing Goal: Will remain free from infection Outcome: Progressing Goal: Diagnostic test results will improve Outcome: Progressing Goal: Respiratory complications will improve Outcome: Progressing Goal: Cardiovascular complication will be avoided Outcome: Progressing   Problem: Clinical Measurements: Goal: Will remain free from infection Outcome: Progressing   Problem: Nutrition: Goal: Adequate nutrition will be maintained Outcome: Progressing   Problem: Activity: Goal: Risk for activity intolerance will decrease Outcome: Progressing   Problem: Nutrition: Goal: Adequate nutrition will be maintained Outcome: Progressing   Problem: Coping: Goal: Level of anxiety will decrease Outcome: Progressing   Problem: Pain Managment: Goal: General experience of comfort will improve Outcome: Progressing   

## 2023-04-04 NOTE — Progress Notes (Signed)
PHARMACY - TOTAL PARENTERAL NUTRITION CONSULT NOTE   Indication: Prolonged ileus  Patient Measurements: Height: 4\' 11"  (149.9 cm) Weight: 108 kg (238 lb 1.6 oz) IBW/kg (Calculated) : 43.2 TPN AdjBW (KG): 57.8 Body mass index is 48.09 kg/m.  Assessment: Debra [name] is a 57 y.o. female s/p laparotomy, excision of greater omental mass, abdominal wall reconstruction with [name], appendectomy, and placement of Prevena vac.  Glucose / Insulin:  --No apparent history of diabetes --7/25 A1C 6.8 --mSSI 4x/day (0500, 1300, 1800, 2200) --BG last 24h: 127-148 --SSI last 24h: 7 units Electrolytes: Within normal limits Renal: SCr stable at baseline Hepatic: Overall within normal limits GI Imaging: 9/11 CTAP: no new acute issues GI Surgeries / Procedures: s/p laparotomy, excision of greater omental mass, abdominal wall reconstruction with [name], appendectomy, and placement of Prevena vac 12/19/22 placement of right IJ port-a-catheter with dual reservoirs also s/p re-opening of laparotomy for repair of small bowel perforation   Central access: 02/15/2022 TPN start date: 02/15/2022  Nutritional Goals: Goal cyclic TPN over 16 hrs: cyclic TPN over 16 hrs: (provides 115.5 g of protein and 1614 kcals per day) total volume over 16hrs for calculations=1500 ml  RD Assessment:  Estimated Needs Total Energy Estimated Needs: 1800-2100kcal/day Total Protein Estimated Needs: 90-110g/d Total Fluid Estimated Needs: 1.4-1.6L/day  Current Nutrition:  Soft diet + nutritional supplements, not meeting PO needs  Plan:  Transitioned to *Cyclic* TPN on 4/69/62.   to run over 16 hours per MD request. [name] at 2000 per discussion with dietician to allow patient time for walking in afternoon/evenings.  To run over 16 hours: -Start rate at 53 mL/hr for 1 hour. -Increase rate to 105 mL/hr for 13 hours.  -Decrease rate to 53 mL/hr for 1 hour. -Decrease rate to 26 mL/hr for 1  hour, then stop.  Plan:  See cyclic TPN rate above Nutritional Components Amino acids (using 15% Clinisol): 115.5 Dextrose 12% = 180 g  Lipids (using 20% SMOFlipids): 52.2 g kCal: 1614/24h  7/25: Adjusted Dextrose from 17% to 12% at physician request and increase Amino acids from 108g to 115.5g per dietary. Total volume increasing from to  to see if helps with blood sugars Electrolytes in TPN: Na 75 mEq/L, K 50 mEq/L, Ca 50mEq/L, Mg 10 mEq/L, Phos 10 mmol/L, Cl:Ac 1:1 Resume MVI, trace elements, chromium, zinc 10 mg, and selenium 150 mcg per dietary on 03/30/23 Continue CBG/SSI for Cyclic TPN:  -CBG 2 hrs after cyclic TPN start -CBG during middle of cyclic TPN -CBG 1 hr after cylic TPN stopped -CBG while off TPN Continue SSI insulin at moderate scale and remove insulin from bag due to significant decrease in dextrose content. Check TPN labs weekly on Mondays and more frequently if indicated  [name] 04/04/2023 7:19 AM

## 2023-04-04 NOTE — Progress Notes (Signed)
04/04/2023  Subjective: Patient had episode of low-grade temperature with tachycardia and chills overnight.  Her lab work this morning still shows a normal white blood cell count of 8.1 although her white blood cell count has been usually on the lower end more recently.  Chest x-ray was done this morning which does not show any pneumonia but does show some low lung volumes.  The patient denies any abdominal pain or chest pain.  She reports that over the last 2-3 nights, she has experienced some chills and also reports having an intermittent cough with phlegm.  Vital signs: Temp:  [98.2 F (36.8 C)-100.8 F (38.2 C)] 100.8 F (38.2 C) (07/27 0746) Pulse Rate:  [73-110] 104 (07/27 0746) Resp:  [18-24] 24 (07/27 0746) BP: (99-147)/(56-71) 105/68 (07/27 0746) SpO2:  [94 %-100 %] 96 % (07/27 0746) Weight:  [107.3 kg] 107.3 kg (07/27 0746)   Intake/Output: 07/26 0701 - 07/27 0700 In: 2339.7 [P.O.:360; I.V.:1449.9; IV Piggyback:529.9] Out: 200 [Stool:200] Last BM Date : 04/04/23  Physical Exam: Constitutional: No acute distress Abdomen: Soft, nondistended, nontender to palpation.  Midline wound is stable with 3 fistula stoma's with no evidence of bleeding at this point. Skin: Right chest Port-A-Cath site is without any erythema or swelling.  Labs:  Recent Labs    04/04/23 0631  WBC 8.1  HGB 8.2*  HCT 26.0*  PLT 86*   Recent Labs    04/02/23 0856 04/04/23 0631  NA 135 136  K 3.5 3.7  CL 102 104  CO2 29 24  GLUCOSE 207* 214*  BUN 26* 23*  CREATININE 0.50 0.59  CALCIUM 7.9* 8.1*   Recent Labs    04/02/23 0856  LABPROT 16.8*  INR 1.3*    Imaging: DG Chest 2 View  Result Date: 04/04/2023 CLINICAL DATA:  57 year old female with cough. Small left pleural effusion. EXAM: CHEST - 2 VIEW COMPARISON:  CT Chest, Abdomen, and Pelvis 04/01/2023 and earlier. FINDINGS: PA and lateral views at 0646 hours. Stable right chest power port. Ongoing low lung volumes. Mild respiratory  motion on the lateral view. Small left pleural effusion and lung base atelectasis appears stable from the recent CT. No pneumothorax or pulmonary edema. Stable cardiac size and mediastinal contours. No confluent right lung opacity. Osteopenia. Spinal compression fractures. Stable visualized osseous structures. Paucity of bowel gas in the visible abdomen. IMPRESSION: Low lung volumes with stable small left pleural effusion since the CT on 04/01/2023. No new cardiopulmonary abnormality. Electronically Signed   By: Odessa Fleming M.D.   On: 04/04/2023 09:00    Assessment/Plan: This is a 56 y.o. female with enterocutaneous fistulas.  - Labs and chest x-ray today did not show any acute abnormalities.  Her white blood cell count has been more typically in the lower end of the range and now is 8.1.  There is been no true fevers and only low-grade temperatures.  She has been coughing with some phlegm but chest x-ray does not show any pneumonia.  As a precaution, will try to get a sputum culture and repeat labs in the morning.  If any worsening, may also need blood cultures or further imaging. - For now otherwise continue soft diet, cyclic TPN, ovulating as tolerated.   I spent 35 minutes dedicated to the care of this patient on the date of this encounter to include pre-visit review of records, face-to-face time with the patient discussing diagnosis and management, and any post-visit coordination of care.  Howie Ill, MD [facility]

## 2023-04-05 LAB — BLOOD CULTURE ID PANEL (REFLEXED) - BCID2

## 2023-04-05 LAB — URINALYSIS, ROUTINE W REFLEX MICROSCOPIC
Bilirubin Urine: NEGATIVE
Glucose, UA: NEGATIVE mg/dL
Hgb urine dipstick: NEGATIVE
Ketones, ur: NEGATIVE mg/dL
Leukocytes,Ua: NEGATIVE
Nitrite: NEGATIVE
Protein, ur: NEGATIVE mg/dL
Specific Gravity, Urine: 1.016 (ref 1.005–1.030)
pH: 6 (ref 5.0–8.0)

## 2023-04-05 LAB — CBC
HCT: 22.9 % — ABNORMAL LOW (ref 36.0–46.0)
Hemoglobin: 7.4 g/dL — ABNORMAL LOW (ref 12.0–15.0)
MCH: 28.4 pg (ref 26.0–34.0)
MCHC: 32.3 g/dL (ref 30.0–36.0)
MCV: 87.7 fL (ref 80.0–100.0)
Platelets: 63 10*3/uL — ABNORMAL LOW (ref 150–400)
RBC: 2.61 MIL/uL — ABNORMAL LOW (ref 3.87–5.11)
RDW: 17.9 % — ABNORMAL HIGH (ref 11.5–15.5)
WBC: 4.8 10*3/uL (ref 4.0–10.5)
nRBC: 0.6 % — ABNORMAL HIGH (ref 0.0–0.2)

## 2023-04-05 LAB — GLUCOSE, CAPILLARY
Glucose-Capillary: 143 mg/dL — ABNORMAL HIGH (ref 70–99)
Glucose-Capillary: 237 mg/dL — ABNORMAL HIGH (ref 70–99)
Glucose-Capillary: 92 mg/dL (ref 70–99)

## 2023-04-05 LAB — CULTURE, BLOOD (SINGLE): Special Requests: ADEQUATE

## 2023-04-05 MED ORDER — SODIUM CHLORIDE 0.9 % IV SOLN
2.0000 g | INTRAVENOUS | Status: AC
  Administered 2023-04-05 – 2023-04-14 (×10): 2 g via INTRAVENOUS
  Filled 2023-04-05 (×10): qty 20

## 2023-04-05 NOTE — Progress Notes (Signed)
04/05/2023  Subjective: Patient had some further low-grade temperature yesterday morning and early afternoon this overall improved overnight.  She has had different tests done including respiratory panel, urinalysis, sputum culture, and blood culture.  Last night, her respiratory panel returned positive for parainfluenza type III and she was placed on droplet precautions.  This morning, 1 of 4 bottles of her blood cultures turn positive for E. coli.  No blood culture was drawn from her Port-A-Cath.  Vital signs: Temp:  [98.3 F (36.8 C)-100 F (37.8 C)] 98.3 F (36.8 C) (07/28 0910) Pulse Rate:  [83-110] 83 (07/28 0910) Resp:  [20-24] 20 (07/28 0325) BP: (97-131)/(55-76) 116/56 (07/28 0910) SpO2:  [94 %-100 %] 97 % (07/28 0910) Weight:  [111.8 kg] 111.8 kg (07/28 0326)   Intake/Output: 07/27 0701 - 07/28 0700 In: 1302.3 [P.O.:480; I.V.:822.3] Out: -  Last BM Date : 04/04/23  Physical Exam: Constitutional: No acute distress Abdomen: Soft, nondistended, nontender to palpation.  Midline wound is stable with 3 fistula stoma's with no evidence of bleeding at this point. Skin: Right chest Port-A-Cath site is without any erythema or swelling.  Labs:  Recent Labs    04/04/23 0631  WBC 8.1  HGB 8.2*  HCT 26.0*  PLT 86*   Recent Labs    04/04/23 0631  NA 136  K 3.7  CL 104  CO2 24  GLUCOSE 214*  BUN 23*  CREATININE 0.59  CALCIUM 8.1*   No results for input(s): "LABPROT", "INR" in the last 72 hours.   Imaging: No results found.  Assessment/Plan: This is a 57 y.o. female with enterocutaneous fistulas.  - Patient's respiratory panel returned positive for parainfluenza type III and 1 of 4 blood culture bottles is positive for E. coli.  No blood culture was obtained from her Port-A-Cath so asked her nurse to obtain a blood culture from it today.  To do this, we have stopped her TPN early in the cycle and the rest of the bag will be discarded.  Given that she does have a  positive blood culture, as a precaution we will hold off on TPN tonight but she can continue having a soft diet.  We will start her on IV ceftriaxone today while awaiting more culture information.  She currently is not septic or hypotensive not showing any signs of true bacteremia.  If Port-A-Cath cultures also positive, port may need to be removed as well. - Will inform Dr. Everlene Farrier of these new changes as this may impact her surgery which was scheduled for 04/09/2023.   I spent 35 minutes dedicated to the care of this patient on the date of this encounter to include pre-visit review of records, face-to-face time with the patient discussing diagnosis and management, and any post-visit coordination of care.  Howie Ill, MD [facility]

## 2023-04-05 NOTE — Progress Notes (Signed)
PHARMACY - PHYSICIAN COMMUNICATION CRITICAL VALUE ALERT - BLOOD CULTURE IDENTIFICATION (BCID)  Results for orders placed or performed during the hospital encounter of 02/13/22  Culture, blood (Routine X 2) w Reflex to ID Panel     Status: None   Collection Time: 02/17/22 10:02 PM   Specimen: BLOOD  Result Value Ref Range Status   Specimen Description BLOOD RIGHT FOREARM  Final   Special Requests   Final    BOTTLES DRAWN AEROBIC AND ANAEROBIC Blood Culture results may not be optimal due to an excessive volume of blood received in culture bottles   Culture   Final    NO GROWTH 5 DAYS Performed at [facility], [address] [place], [place] [address]    Report Status 02/22/2022 FINAL  Final  Culture, blood (Routine X 2) w Reflex to ID Panel     Status: None   Collection Time: 02/17/22 10:02 PM   Specimen: BLOOD  Result Value Ref Range Status   Specimen Description BLOOD RIGHT HAND  Final   Special Requests   Final    IN PEDIATRIC BOTTLE Blood Culture results may not be optimal due to an excessive volume of blood received in culture bottles   Culture   Final    NO GROWTH 5 DAYS Performed at [facility], [address], [place], [place] [address]    Report Status 02/22/2022 FINAL  Final  Culture, blood (Routine X 2) w Reflex to ID Panel     Status: None   Collection Time: 02/18/22 11:05 AM   Specimen: BLOOD  Result Value Ref Range Status   Specimen Description BLOOD RIGHT ANTECUBITAL  Final   Special Requests   Final    BOTTLES DRAWN AEROBIC AND ANAEROBIC Blood Culture adequate volume   Culture   Final    NO GROWTH 5 DAYS Performed at [facility], [address] [place], [place] [address]    Report Status 02/23/2022 FINAL  Final  Culture, blood (Routine X 2) w Reflex to ID Panel     Status: None   Collection Time: 02/18/22 11:30 AM   Specimen: BLOOD  Result Value Ref Range Status   Specimen Description BLOOD BLOOD RIGHT ARM  Final   Special  Requests   Final    BOTTLES DRAWN AEROBIC AND ANAEROBIC Blood Culture adequate volume   Culture   Final    NO GROWTH 5 DAYS Performed at [facility], [address] [place], [place] [address]    Report Status 02/23/2022 FINAL  Final  Aerobic/Anaerobic Culture w Gram Stain (surgical/deep wound)     Status: None   Collection Time: 02/20/22  8:18 AM   Specimen: Wound; Abscess  Result Value Ref Range Status   Specimen Description   Final    WOUND Performed at [facility], [address] [place], [place] [address]    Special Requests LLQ DRAIN INTRA ABDOMINAL  Final   Gram Stain   Final    NO SQUAMOUS EPITHELIAL CELLS SEEN MODERATE WBC SEEN FEW YEAST    Culture   Final    MODERATE CANDIDA ALBICANS NO ANAEROBES ISOLATED Performed at [facility], [address] [place], [place] [address]    Report Status 02/25/2022 FINAL  Final  Culture, blood (Routine X 2) w Reflex to ID Panel     Status: None   Collection Time: 03/05/22 11:47 AM   Specimen: BLOOD  Result Value Ref Range Status   Specimen Description  BLOOD BLOOD RIGHT HAND  Final   Special Requests   Final    BOTTLES DRAWN AEROBIC ONLY Blood Culture adequate volume   Culture   Final    NO GROWTH 5 DAYS Performed at [facility], [address] [place], [place] [address]    Report Status 03/10/2022 FINAL  Final  Culture, blood (Routine X 2) w Reflex to ID Panel     Status: None   Collection Time: 03/05/22 12:13 PM   Specimen: BLOOD  Result Value Ref Range Status   Specimen Description BLOOD RTHUMB  Final   Special Requests   Final    BOTTLES DRAWN AEROBIC AND ANAEROBIC Blood Culture adequate volume   Culture   Final    NO GROWTH 5 DAYS Performed at [facility], [address] [place], [place] [address]    Report Status 03/10/2022 FINAL  Final  Culture, blood (Routine X 2) w Reflex to ID Panel     Status: None   Collection Time: 09/12/22  5:31 PM   Specimen: BLOOD  LEFT HAND  Result Value Ref Range Status   Specimen Description BLOOD LEFT HAND  Final   Special Requests   Final    BOTTLES DRAWN AEROBIC ONLY Blood Culture results may not be optimal due to an inadequate volume of blood received in culture bottles   Culture   Final    NO GROWTH 5 DAYS Performed at [facility], [address] [place], [place] [address]    Report Status 09/17/2022 FINAL  Final  Culture, blood (Routine X 2) w Reflex to ID Panel     Status: None   Collection Time: 09/12/22  5:31 PM   Specimen: BLOOD  Result Value Ref Range Status   Specimen Description BLOOD LFOA  Final   Special Requests   Final    BOTTLES DRAWN AEROBIC AND ANAEROBIC Blood Culture adequate volume   Culture   Final    NO GROWTH 5 DAYS Performed at [facility], [address] [place], [place] [address]    Report Status 09/17/2022 FINAL  Final  Urine Culture     Status: Abnormal   Collection Time: 09/14/22  5:44 AM   Specimen: Urine, Clean Catch  Result Value Ref Range Status   Specimen Description   Final    URINE, CLEAN CATCH Performed at [facility], [address] [place], [place] [address]    Special Requests   Final    NONE Performed at [facility], [address] [place], [place] [address]    Culture (A)  Final    30,000 COLONIES/mL MULTIPLE SPECIES PRESENT, SUGGEST RECOLLECTION   Report Status 09/15/2022 FINAL  Final  Respiratory (~20 pathogens) panel by PCR     Status: None   Collection Time: 09/15/22  2:05 PM   Specimen: Nasopharyngeal Swab; Respiratory  Result Value Ref Range Status   Adenovirus NOT DETECTED NOT DETECTED Final   Coronavirus 229E NOT DETECTED NOT DETECTED Final    Comment: (NOTE) The Coronavirus on the Respiratory Panel, DOES NOT test for the novel  Coronavirus (2019 nCoV)    Coronavirus HKU1 NOT DETECTED NOT DETECTED Final   Coronavirus NL63 NOT DETECTED NOT DETECTED Final   Coronavirus OC43 NOT DETECTED NOT  DETECTED Final   Metapneumovirus NOT DETECTED NOT DETECTED Final   Rhinovirus / Enterovirus NOT DETECTED NOT DETECTED Final   Influenza A NOT DETECTED NOT DETECTED Final   Influenza B NOT DETECTED NOT DETECTED  Final   Parainfluenza Virus 1 NOT DETECTED NOT DETECTED Final   Parainfluenza Virus 2 NOT DETECTED NOT DETECTED Final   Parainfluenza Virus 3 NOT DETECTED NOT DETECTED Final   Parainfluenza Virus 4 NOT DETECTED NOT DETECTED Final   Respiratory Syncytial Virus NOT DETECTED NOT DETECTED Final   Bordetella pertussis NOT DETECTED NOT DETECTED Final   Bordetella Parapertussis NOT DETECTED NOT DETECTED Final   Chlamydophila pneumoniae NOT DETECTED NOT DETECTED Final   Mycoplasma pneumoniae NOT DETECTED NOT DETECTED Final    Comment: Performed at [facility], [address], [place], [place] [address]  Cath Tip Culture     Status: Abnormal   Collection Time: 09/15/22  2:05 PM   Specimen: Catheter Tip  Result Value Ref Range Status   Specimen Description CATH TIP  Final   Special Requests NONE  Final   Culture (A)  Final    >=100,000 COLONIES/mL STAPHYLOCOCCUS EPIDERMIDIS CALL MICROBIOLOGY LAB IF SENSITIVITIES ARE REQUIRED. Performed at [facility], [address] [place], [place] [address]    Report Status 09/17/2022 FINAL  Final  Respiratory (~20 pathogens) panel by PCR     Status: Abnormal   Collection Time: 04/04/23  1:57 PM   Specimen: Nasopharyngeal Swab; Respiratory  Result Value Ref Range Status   Adenovirus NOT DETECTED NOT DETECTED Final   Coronavirus 229E NOT DETECTED NOT DETECTED Final    Comment: (NOTE) The Coronavirus on the Respiratory Panel, DOES NOT test for the novel  Coronavirus (2019 nCoV)    Coronavirus HKU1 NOT DETECTED NOT DETECTED Final   Coronavirus NL63 NOT DETECTED NOT DETECTED Final   Coronavirus OC43 NOT DETECTED NOT DETECTED Final   Metapneumovirus NOT DETECTED NOT DETECTED Final   Rhinovirus / Enterovirus NOT DETECTED NOT DETECTED  Final   Influenza A NOT DETECTED NOT DETECTED Final   Influenza B NOT DETECTED NOT DETECTED Final   Parainfluenza Virus 1 NOT DETECTED NOT DETECTED Final   Parainfluenza Virus 2 NOT DETECTED NOT DETECTED Final   Parainfluenza Virus 3 DETECTED (A) NOT DETECTED Final   Parainfluenza Virus 4 NOT DETECTED NOT DETECTED Final   Respiratory Syncytial Virus NOT DETECTED NOT DETECTED Final   Bordetella pertussis NOT DETECTED NOT DETECTED Final   Bordetella Parapertussis NOT DETECTED NOT DETECTED Final   Chlamydophila pneumoniae NOT DETECTED NOT DETECTED Final   Mycoplasma pneumoniae NOT DETECTED NOT DETECTED Final    Comment: Performed at [facility], [address] [place], [place] [address]  Culture, blood (Routine X 2) w Reflex to ID Panel     Status: None (Preliminary result)   Collection Time: 04/04/23  2:52 PM   Specimen: BLOOD  Result Value Ref Range Status   Specimen Description BLOOD BLOOD RIGHT HAND  Final   Special Requests   Final    BOTTLES DRAWN AEROBIC AND ANAEROBIC Blood Culture results may not be optimal due to an excessive volume of blood received in culture bottles   Culture   Final    NO GROWTH < 24 HOURS Performed at [facility], [address], [place], [place] [address]    Report Status PENDING  Incomplete  Culture, blood (Routine X 2) w Reflex to ID Panel     Status: None (Preliminary result)   Collection Time: 04/04/23  2:59 PM   Specimen: BLOOD  Result Value Ref Range Status   Specimen Description BLOOD BLOOD LEFT WRIST  Final   Special Requests   Final  BOTTLES DRAWN AEROBIC AND ANAEROBIC Blood Culture results may not be optimal due to an excessive volume of blood received in culture bottles   Culture  Setup Time   Final    GRAM NEGATIVE RODS AEROBIC BOTTLE ONLY Organism ID to follow CRITICAL RESULT CALLED TO, READ BACK BY AND VERIFIED WITH: [email or web address]  04/05/23 LFD Performed at [facility], [address]  [place], [place] [address]    Culture GRAM NEGATIVE RODS  Final   Report Status PENDING  Incomplete  Blood Culture ID Panel (Reflexed)     Status: Abnormal   Collection Time: 04/04/23  2:59 PM  Result Value Ref Range Status   Enterococcus faecalis NOT DETECTED NOT DETECTED Final   Enterococcus Faecium NOT DETECTED NOT DETECTED Final   Listeria monocytogenes NOT DETECTED NOT DETECTED Final   Staphylococcus species NOT DETECTED NOT DETECTED Final   Staphylococcus aureus (BCID) NOT DETECTED NOT DETECTED Final   Staphylococcus epidermidis NOT DETECTED NOT DETECTED Final   Staphylococcus lugdunensis NOT DETECTED NOT DETECTED Final   Streptococcus species NOT DETECTED NOT DETECTED Final   Streptococcus agalactiae NOT DETECTED NOT DETECTED Final   Streptococcus pneumoniae NOT DETECTED NOT DETECTED Final   Streptococcus pyogenes NOT DETECTED NOT DETECTED Final   A.calcoaceticus-baumannii NOT DETECTED NOT DETECTED Final   Bacteroides fragilis NOT DETECTED NOT DETECTED Final   Enterobacterales DETECTED (A) NOT DETECTED Final    Comment: Enterobacterales represent a large order of gram negative bacteria, not a single organism.   Enterobacter cloacae complex NOT DETECTED NOT DETECTED Final   Escherichia coli DETECTED (A) NOT DETECTED Final    Comment: CRITICAL RESULT CALLED TO, READ BACK BY AND VERIFIED WITH: Kamla Skilton @ 0526 04/05/23 LFD    Klebsiella aerogenes NOT DETECTED NOT DETECTED Final   Klebsiella oxytoca NOT DETECTED NOT DETECTED Final   Klebsiella pneumoniae NOT DETECTED NOT DETECTED Final   Proteus species NOT DETECTED NOT DETECTED Final   Salmonella species NOT DETECTED NOT DETECTED Final   Serratia marcescens NOT DETECTED NOT DETECTED Final   Haemophilus influenzae NOT DETECTED NOT DETECTED Final   Neisseria meningitidis NOT DETECTED NOT DETECTED Final   Pseudomonas aeruginosa NOT DETECTED NOT DETECTED Final   Stenotrophomonas maltophilia NOT DETECTED NOT DETECTED Final   Candida  albicans NOT DETECTED NOT DETECTED Final   Candida auris NOT DETECTED NOT DETECTED Final   Candida glabrata NOT DETECTED NOT DETECTED Final   Candida krusei NOT DETECTED NOT DETECTED Final   Candida parapsilosis NOT DETECTED NOT DETECTED Final   Candida tropicalis NOT DETECTED NOT DETECTED Final   Cryptococcus neoformans/gattii NOT DETECTED NOT DETECTED Final   CTX-M ESBL NOT DETECTED NOT DETECTED Final   Carbapenem resistance IMP NOT DETECTED NOT DETECTED Final   Carbapenem resistance KPC NOT DETECTED NOT DETECTED Final   Carbapenem resistance NDM NOT DETECTED NOT DETECTED Final   Carbapenem resist OXA 48 LIKE NOT DETECTED NOT DETECTED Final   Carbapenem resistance VIM NOT DETECTED NOT DETECTED Final    Comment: Performed at [facility], [address] [place], [place] [address]    BCID Results: 1 (aerobic) of 4 bottles with E coli, no resistance detected.  Pt is currently not on any abx.  Name of provider contacted: Richarda Blade, MD  Changes to prescribed antibiotics required: Start Ceftriaxone after port-a-cath blood sample has been obtained.  Otelia Sergeant, PharmD, [facility] 04/05/2023 5:32 AM

## 2023-04-05 NOTE — Progress Notes (Signed)
PHARMACY - TOTAL PARENTERAL NUTRITION CONSULT NOTE   Indication: Prolonged ileus  Patient Measurements: Height: 4\' 11"  (149.9 cm) Weight: 111.8 kg (246 lb 7.6 oz) IBW/kg (Calculated) : 43.2 TPN AdjBW (KG): 57.8 Body mass index is 49.78 kg/m.  Assessment: Debra [name] is a 57 y.o. female s/p laparotomy, excision of greater omental mass, abdominal wall reconstruction with [name], appendectomy, and placement of Prevena vac.  Glucose / Insulin:  --No apparent history of diabetes --7/25 A1C 6.8 --mSSI 4x/day (0500, 1300, 1800, 2200) --BG last 24h: 145-236 --SSI last 24h: 10 units Electrolytes: Within normal limits Renal: SCr stable at baseline Hepatic: Overall within normal limits GI Imaging: 9/11 CTAP: no new acute issues GI Surgeries / Procedures: s/p laparotomy, excision of greater omental mass, abdominal wall reconstruction with [name], appendectomy, and placement of Prevena vac 12/19/22 placement of right IJ port-a-catheter with dual reservoirs also s/p re-opening of laparotomy for repair of small bowel perforation   Central access: 02/15/2022 TPN start date: 02/15/2022  Nutritional Goals: Goal cyclic TPN over 16 hrs: cyclic TPN over 16 hrs: (provides 115.5 g of protein and 1614 kcals per day) total volume over 16hrs for calculations=1500 ml  RD Assessment:  Estimated Needs Total Energy Estimated Needs: 1800-2100kcal/day Total Protein Estimated Needs: 90-110g/d Total Fluid Estimated Needs: 1.4-1.6L/day  Current Nutrition:  Soft diet + nutritional supplements, not meeting PO needs  Plan:  Transitioned to *Cyclic* TPN on 05/22/77.   to run over 16 hours per MD request. [name] at 2000 per discussion with dietician to allow patient time for walking in afternoon/evenings.  To run over 16 hours: -Start rate at 53 mL/hr for 1 hour. -Increase rate to 105 mL/hr for 13 hours.  -Decrease rate to 53 mL/hr for 1 hour. -Decrease rate to 26 mL/hr  for 1 hour, then stop.  Plan:  See cyclic TPN rate above Nutritional Components Amino acids (using 15% Clinisol): 115.5 Dextrose 12% = 180 g  Lipids (using 20% SMOFlipids): 52.2 g kCal: 1614/24h  7/25: Adjusted Dextrose from 17% to 12% at physician request and increase Amino acids from 108g to 115.5g per dietary. Total volume increasing from to  to see if helps with blood sugars Electrolytes in TPN: Na 75 mEq/L, K 50 mEq/L, Ca 47mEq/L, Mg 10 mEq/L, Phos 10 mmol/L, Cl:Ac 1:1 Resume MVI, trace elements, chromium, zinc 10 mg, and selenium 150 mcg per dietary on 03/30/23 Continue CBG/SSI for Cyclic TPN:  -CBG 2 hrs after cyclic TPN start -CBG during middle of cyclic TPN -CBG 1 hr after cylic TPN stopped -CBG while off TPN Continue SSI insulin at moderate scale and remove insulin from bag due to significant decrease in dextrose content. Check TPN labs weekly on Mondays and more frequently if indicated  7/28: Holding TPN per discussion with surgery team given new E coli bacteremia  [name] 04/05/2023 7:20 AM

## 2023-04-05 NOTE — Plan of Care (Signed)
  Problem: Clinical Measurements: Goal: Ability to maintain clinical measurements within normal limits will improve Outcome: Progressing   Problem: Clinical Measurements: Goal: Diagnostic test results will improve Outcome: Progressing   Problem: Clinical Measurements: Goal: Respiratory complications will improve Outcome: Progressing   Problem: Clinical Measurements: Goal: Cardiovascular complication will be avoided Outcome: Progressing

## 2023-04-05 NOTE — Plan of Care (Signed)
Patient still reports some chills. Patient tested positive for the parainfluenza virus this evening. Patient reports some mild discomfort at the abdomen wound site, pain relieved by tylenol. Patient scheduled for surgery on 04/09/23. Nursing will continue to follow the patient progress and plan of care.

## 2023-04-06 ENCOUNTER — Other Ambulatory Visit: Payer: Self-pay

## 2023-04-06 DIAGNOSIS — R7881 Bacteremia: Secondary | ICD-10-CM

## 2023-04-06 DIAGNOSIS — B9620 Unspecified Escherichia coli [E. coli] as the cause of diseases classified elsewhere: Secondary | ICD-10-CM

## 2023-04-06 LAB — GLUCOSE, CAPILLARY
Glucose-Capillary: 150 mg/dL — ABNORMAL HIGH (ref 70–99)
Glucose-Capillary: 121 mg/dL — ABNORMAL HIGH (ref 70–99)
Glucose-Capillary: 134 mg/dL — ABNORMAL HIGH (ref 70–99)
Glucose-Capillary: 91 mg/dL (ref 70–99)
Glucose-Capillary: 144 mg/dL — ABNORMAL HIGH (ref 70–99)

## 2023-04-06 MED ORDER — LIDOCAINE-EPINEPHRINE 1 %-1:100000 IJ SOLN
20.0000 mL | Freq: Once | INTRAMUSCULAR | Status: AC
  Administered 2023-04-06: 20 mL via INTRADERMAL
  Filled 2023-04-06: qty 20

## 2023-04-06 MED ORDER — POTASSIUM CHLORIDE 20 MEQ PO PACK
40.0000 meq | PACK | Freq: Once | ORAL | Status: AC
  Administered 2023-04-06: 40 meq via ORAL
  Filled 2023-04-06: qty 2

## 2023-04-06 NOTE — Consult Note (Signed)
NAME: Debra [name]  DOB: Jan 29, 1966  MRN: [id number]  Date/Time: 04/06/2023 3:57 PM  REQUESTING PROVIDER: Dr.Pabon Subjective:  REASON FOR CONSULT: Ecoli bacteremia ? Debra [name] is a 57 y.o. 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 Pt was admitted to [facility] on 02/13/22 Underwent laparotomy and excision of the pelvic mass from greater omentum on 02/13/22 with abdominal wall reconstruction, recurrent incisional hernia repair and  wound vac placement As she had significant increased output in the wound vac , color being brown concern for bowel perforation and she was taken back for surgery on 02/15/22 and was noted to have ileum  perforation with enteric content in the abdominal cavity. 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 A new PORT was placed on 12/17/22 Last course of antibiotic was in cefepime  1/8-1//11/24 A new CT abdomen done on 7/24 in preparation for surgery  showed the EC fistula and a new fluid collection adjacent to the proximal stomach measurin 2.3 cm  Pt had a fever of 100.8 on 7/27 and blood culture sent from port and periphery grew  E.coli- She also had Parainfleuza 3   Pt says the fluid from the abdomen [name] pouch is seeping out She has cough and some congestion  Past Medical History:  Diagnosis Date   Anemia    Colon cancer (HCC) 04/2020   History of kidney stones    Hypothyroidism    Thyroid disease     Past Surgical History:  Procedure Laterality Date   ABDOMINAL WALL DEFECT REPAIR  N/A 02/13/2022   Procedure: REPAIR ABDOMINAL WALL;  Surgeon: Leafy Ro, MD;  Location: [facility];  Service: General;  Laterality: N/A;   APPENDECTOMY N/A 02/13/2022   Procedure: APPENDECTOMY;  Surgeon: Leafy Ro, MD;  Location: [facility];  Service: General;  Laterality: N/A;   APPLICATION OF WOUND VAC N/A 02/15/2022   Procedure: APPLICATION OF WOUND VAC;  Surgeon: Leafy Ro, MD;  Location: [facility];  Service: General;  Laterality: N/A;  [id number]   COLON RESECTION SIGMOID N/A 02/13/2022   Procedure: COLON RESECTION SIGMOID, open, [name], PA-C to assist;  Surgeon: Leafy Ro, MD;  Location: [facility];  Service: General;  Laterality: N/A;   COLONOSCOPY WITH PROPOFOL N/A 11/21/2019   Procedure: COLONOSCOPY WITH PROPOFOL;  Surgeon: Toney Reil, MD;  Location: [facility];  Service: Gastroenterology;  Laterality: N/A;   COLONOSCOPY WITH PROPOFOL N/A 07/10/2021   Procedure: COLONOSCOPY WITH PROPOFOL;  Surgeon: Toney Reil, MD;  Location: [facility] ENDOSCOPY;  Service: Gastroenterology;  Laterality: N/A;   COLONOSCOPY WITH PROPOFOL N/A 02/06/2022   Procedure: COLONOSCOPY WITH PROPOFOL;  Surgeon: Toney Reil, MD;  Location: [facility] ENDOSCOPY;  Service: Gastroenterology;  Laterality: N/A;  SPANISH   CYSTOSCOPY WITH STENT PLACEMENT Left 01/30/2020   Procedure: CYSTOSCOPY WITH STENT PLACEMENT;  Surgeon: Vanna Scotland, MD;  Location: [facility];  Service: Urology;  Laterality: Left;   ESOPHAGOGASTRODUODENOSCOPY (EGD) WITH PROPOFOL N/A 11/21/2019   Procedure: ESOPHAGOGASTRODUODENOSCOPY (EGD) WITH PROPOFOL;  Surgeon: Toney Reil, MD;  Location: [facility]  ENDOSCOPY;  Service: Gastroenterology;  Laterality: N/A;   INSERTION OF MESH N/A 02/13/2022   Procedure: INSERTION OF MESH;  Surgeon: Leafy Ro, MD;  Location: [facility];  Service: General;  Laterality: N/A;   INSERTION OF MESH N/A 02/15/2022   Procedure: INSERTION OF MESH;  Surgeon: Leafy Ro, MD;  Location: [facility];   Service: General;  Laterality: N/A;   IR RADIOLOGIST EVAL & MGMT  07/03/2020   IR RADIOLOGIST EVAL & MGMT  07/12/2020   LAPAROTOMY N/A 02/15/2022   Procedure: EXPLORATORY LAPAROTOMY REPAIR OF BOWEL PERFORATION;  Surgeon: Leafy Ro, MD;  Location: [facility];  Service: General;  Laterality: N/A;   PARTIAL COLECTOMY N/A 01/30/2020   Procedure: PARTIAL COLECTOMY Sigmoid;  Surgeon: Duanne Guess, MD;  Location: [facility];  Service: General;  Laterality: N/A;   PORTACATH PLACEMENT Left 03/28/2020   Procedure: INSERTION PORT-A-CATH;  Surgeon: Duanne Guess, MD;  Location: [facility];  Service: General;  Laterality: Left;   PORTACATH PLACEMENT N/A 08/29/2020   Procedure: INSERTION PORT-A-CATH, chemotherapy;  Surgeon: Duanne Guess, MD;  Location: [facility];  Service: General;  Laterality: N/A;   PORTACATH PLACEMENT N/A 12/19/2022   Procedure: INSERTION PORT-A-CATH;  Surgeon: Leafy Ro, MD;  Location: [facility];  Service: General;  Laterality: N/A;   VENTRAL HERNIA REPAIR N/A 02/13/2022   Procedure: HERNIA REPAIR VENTRAL ADULT, open, [name], PA-C to assist;  Surgeon: Leafy Ro, MD;  Location: [facility];  Service: General;  Laterality: N/A;    Social History   Socioeconomic History   Marital status: Significant Other    Spouse name: Not on file   Number of children: Not on file   Years of education: Not on file   Highest education level: Not on file  Occupational History   Occupation: Factory  Tobacco Use   Smoking status: Never   Smokeless tobacco: Never  Vaping Use   Vaping status: Never Used  Substance and Sexual Activity   Alcohol use: Never   Drug use: Never   Sexual activity: Not on file  Other Topics Concern   Not on file  Social History Narrative   Lives at home with significant other   Social Determinants of Health   Financial Resource Strain: Not on file  Food Insecurity: No Food Insecurity (05/20/2022)   Hunger Vital Sign    Worried About Running Out of  Food in the Last Year: Never true    Ran Out of Food in the Last Year: Never true  Transportation Needs: No Transportation Needs (05/20/2022)   PRAPARE - Administrator, Civil Service (Medical): No    Lack of Transportation (Non-Medical): No  Physical Activity: Not on file  Stress: Not on file  Social Connections: Not on file  Intimate Partner Violence: Not At Risk (05/20/2022)   Humiliation, Afraid, Rape, and Kick questionnaire    Fear of Current or Ex-Partner: No    Emotionally Abused: No    Physically Abused: No    Sexually Abused: No    Family History  Problem Relation Age of Onset   Diabetes Sister    No Known Allergies I? Current Facility-Administered Medications  Medication Dose Route Frequency Provider Last Rate Last Admin   acetaminophen (TYLENOL) tablet 650 mg  650 mg Oral Q6H PRN Sterling Big F, MD   650 mg at 04/05/23 2031   alteplase (CATHFLO ACTIVASE) injection 2 mg  2 mg Intracatheter Once Leafy Ro, MD  cefTRIAXone (ROCEPHIN) 2 g in sodium chloride 0.9 % 100 mL IVPB  2 g Intravenous Q24H Pabon, Diego F, MD 200 mL/hr at 04/06/23 1329 2 g at 04/06/23 1329   Chlorhexidine Gluconate Cloth 2 % PADS 6 each  6 each Topical Daily Leafy Ro, MD   6 each at 04/06/23 0911   diphenhydrAMINE (BENADRYL) 12.5 MG/5ML elixir 12.5 mg  12.5 mg Oral Q6H PRN Pabon, Diego F, MD   12.5 mg at 08/22/22 2101   Or   diphenhydrAMINE (BENADRYL) injection 12.5 mg  12.5 mg Intravenous Q6H PRN Pabon, Diego F, MD       feeding supplement (ENSURE ENLIVE / ENSURE PLUS) liquid 237 mL  237 mL Oral BID BM Pabon, Diego F, MD   237 mL at 04/06/23 1330   fluticasone (FLONASE) 50 MCG/ACT nasal spray 2 spray  2 spray Each Nare BID PRN Leafy Ro, MD   2 spray at 04/01/23 1845   furosemide (LASIX) tablet 20 mg  20 mg Oral BID Sterling Big F, MD   20 mg at 04/06/23 0912   insulin aspart (novoLOG) injection 0-15 Units  0-15 Units Subcutaneous QID Sharen Hones, RPH   2 Units at  04/06/23 1333   iohexol (OMNIPAQUE) 9 MG/ML oral solution 1,000 mL  1,000 mL Oral Once PRN Carolan Shiver, MD       levothyroxine (SYNTHROID) tablet 137 mcg  137 mcg Oral Q0600 Leafy Ro, MD   137 mcg at 04/06/23 0553   lidocaine (LIDODERM) 5 % 2 patch  2 patch Transdermal Daily PRN Leafy Ro, MD   2 patch at 11/22/22 1034   melatonin tablet 5 mg  5 mg Oral QHS PRN Sterling Big F, MD   5 mg at 01/19/23 2154   menthol-cetylpyridinium (CEPACOL) lozenge 3 mg  1 lozenge Oral PRN Donovan Kail, PA-C   3 mg at 03/30/23 1435   methocarbamol (ROBAXIN) tablet 500 mg  500 mg Oral Q8H PRN Pabon, Cecelia Byars F, MD   500 mg at 01/19/23 1103   Or   methocarbamol (ROBAXIN) 500 mg in dextrose 5 % 50 mL IVPB  500 mg Intravenous Q8H PRN Pabon, Diego F, MD       morphine (PF) 2 MG/ML injection 2 mg  2 mg Intravenous Q4H PRN Piscoya, Jose, MD       ondansetron (ZOFRAN-ODT) disintegrating tablet 4 mg  4 mg Oral Q6H PRN Pabon, Diego F, MD   4 mg at 03/01/23 0553   Or   ondansetron (ZOFRAN) injection 4 mg  4 mg Intravenous Q6H PRN Pabon, Cecelia Byars F, MD   4 mg at 01/26/23 9528   oxyCODONE (Oxy IR/ROXICODONE) immediate release tablet 5-10 mg  5-10 mg Oral Q4H PRN Pabon, Diego F, MD   10 mg at 03/01/23 0553   pantoprazole (PROTONIX) EC tablet 40 mg  40 mg Oral BID Pabon, [place] F, MD   40 mg at 04/06/23 0911   phenol (CHLORASEPTIC) mouth spray 1 spray  1 spray Mouth/Throat PRN Pabon, Diego F, MD       pregabalin (LYRICA) capsule 200 mg  200 mg Oral TID Sterling Big F, MD   200 mg at 04/06/23 4132   prochlorperazine (COMPAZINE) injection 10 mg  10 mg Intravenous Q6H PRN Sterling Big F, MD   10 mg at 03/01/23 1113   silver nitrate applicators applicator 10 Stick  10 Stick Topical PRN Leafy Ro, MD       sodium  chloride flush (NS) 0.9 % injection 10-40 mL  10-40 mL Intracatheter Q12H Pabon, Diego F, MD   10 mL at 04/06/23 0912   sodium chloride flush (NS) 0.9 % injection 10-40 mL  10-40 mL Intracatheter PRN  Sterling Big F, MD   10 mL at 02/13/23 1337   vitamin A capsule 30,000 Units  30,000 Units Oral Daily Sterling Big F, MD   30,000 Units at 04/05/23 1605   Vitamin D (Ergocalciferol) (DRISDOL) 1.25 MG (50000 UNIT) capsule 50,000 Units  50,000 Units Oral Q7 days Donovan Kail, PA-C   50,000 Units at 04/01/23 1007     Abtx:  Anti-infectives (From admission, onward)    Start     Dose/Rate Route Frequency Ordered Stop   04/05/23 0900  cefTRIAXone (ROCEPHIN) 2 g in sodium chloride 0.9 % 100 mL IVPB        2 g 200 mL/hr over 30 Minutes Intravenous Every 24 hours 04/05/23 0634     12/19/22 1000  ceFAZolin (ANCEF) IVPB 2g/100 mL premix        2 g 200 mL/hr over 30 Minutes Intravenous  Once 12/18/22 1738 12/19/22 1141   09/17/22 1200  vancomycin (VANCOREADY) IVPB 1250 mg/250 mL  Status:  Discontinued        1,250 mg 166.7 mL/hr over 90 Minutes Intravenous Every 24 hours 09/16/22 1040 09/22/22 1139   09/16/22 1200  vancomycin (VANCOREADY) IVPB 1250 mg/250 mL        1,250 mg 166.7 mL/hr over 90 Minutes Intravenous  Once 09/15/22 1706 09/16/22 1255   09/15/22 1200  ceFEPIme (MAXIPIME) 2 g in sodium chloride 0.9 % 100 mL IVPB  Status:  Discontinued        2 g 200 mL/hr over 30 Minutes Intravenous Every 8 hours 09/15/22 1024 09/18/22 1026   09/15/22 1200  vancomycin (VANCOREADY) IVPB 2000 mg/400 mL        2,000 mg 200 mL/hr over 120 Minutes Intravenous  Once 09/15/22 1024 09/15/22 1500   03/04/22 2200  piperacillin-tazobactam (ZOSYN) IVPB 3.375 g        3.375 g 12.5 mL/hr over 240 Minutes Intravenous Every 8 hours 03/04/22 2005 03/18/22 0248   02/21/22 1500  vancomycin (VANCOREADY) IVPB 1250 mg/250 mL  Status:  Discontinued        1,250 mg 166.7 mL/hr over 90 Minutes Intravenous Every 24 hours 02/21/22 1111 02/24/22 1336   02/21/22 1400  meropenem (MERREM) 1 g in sodium chloride 0.9 % 100 mL IVPB        1 g 200 mL/hr over 30 Minutes Intravenous Every 8 hours 02/21/22 1258 02/27/22 2157    02/20/22 1500  vancomycin (VANCOCIN) IVPB 1000 mg/200 mL premix  Status:  Discontinued        1,000 mg 200 mL/hr over 60 Minutes Intravenous Every 24 hours 02/19/22 1706 02/21/22 1111   02/19/22 1115  vancomycin (VANCOREADY) IVPB 2000 mg/400 mL        2,000 mg 200 mL/hr over 120 Minutes Intravenous  Once 02/19/22 1025 02/19/22 1732   02/19/22 1115  fluconazole (DIFLUCAN) IVPB 400 mg        400 mg 100 mL/hr over 120 Minutes Intravenous Every 24 hours 02/19/22 1025 03/04/22 1301   02/15/22 0730  piperacillin-tazobactam (ZOSYN) IVPB 3.375 g  Status:  Discontinued        3.375 g 12.5 mL/hr over 240 Minutes Intravenous Every 8 hours 02/15/22 0639 02/21/22 1235   02/15/22 0655  piperacillin-tazobactam (ZOSYN) 3.375 GM/50ML IVPB  Note to Pharmacy: Loretha Stapler N: cabinet override      02/15/22 0655 02/15/22 0816   02/13/22 2200  cefoTEtan (CEFOTAN) 2 g in sodium chloride 0.9 % 100 mL IVPB  Status:  Discontinued        2 g 200 mL/hr over 30 Minutes Intravenous Every 12 hours 02/13/22 1541 02/15/22 0639   02/13/22 1345  cefoTEtan (CEFOTAN) 2 g in sodium chloride 0.9 % 100 mL IVPB        2 g 200 mL/hr over 30 Minutes Intravenous  Once 02/13/22 1331 02/13/22 2146   02/13/22 1336  sodium chloride 0.9 % with cefoTEtan (CEFOTAN) ADS Med       Note to Pharmacy: Gates Rigg: cabinet override      02/13/22 1336 02/14/22 0144   02/13/22 0620  sodium chloride 0.9 % with cefoTEtan (CEFOTAN) ADS Med       Note to Pharmacy: Agnes Lawrence A: cabinet override      02/13/22 0620 02/13/22 0802   02/13/22 0600  cefoTEtan (CEFOTAN) 2 g in sodium chloride 0.9 % 100 mL IVPB        2 g 200 mL/hr over 30 Minutes Intravenous On call to O.R. 02/12/22 2229 02/13/22 2146       REVIEW OF SYSTEMS:  Const: had  fever but better now , negative chills,  Eyes: negative diplopia or visual changes, negative eye pain ENT:  coryza,  sore throat Resp: + cough, no hemoptysis, dyspnea Cards: negative for chest  pain, palpitations, lower extremity edema GU: negative for frequency, dysuria and hematuria GI: some  abdominal pain, ,draining  Skin: negative for rash and pruritus Heme: negative for easy bruising and gum/nose bleeding MS:  weakness Neurolo:negative for headaches, dizziness, vertigo, memory problems  Psych: negative for feelings of anxiety, depression  Endocrine: negative for thyroid, diabetes Allergy/Immunology- negative for any medication or food allergies ?  VITALS:  BP (!) 96/58 (BP Location: Right Arm)   Pulse 79   Temp 98.5 F (36.9 C) (Oral)   Resp 20   Ht 4\' 11"  (1.499 m)   Wt 110 kg   SpO2 95%   BMI 48.98 kg/m  LDA [name] pouch PHYSICAL EXAM:  General: Alert, cooperative, no distress, appears stated age.  Head: Normocephalic, without obvious abnormality, atraumatic. Eyes: Conjunctivae clear, anicteric sclerae. Pupils are equal ENT Nares normal. No drainage or sinus tenderness. Lips, mucosa, and tongue normal. No Thrush Neck symmetrical, no adenopathy, thyroid: non tender no carotid bruit and no JVD. Back: No CVA tenderness. Lungs: b/l air entry Heart: s1s2. Abdomen: Soft, abdominal wall dehiscence- high volume out put of intestinal secretions in [name] pouch Extremities: atraumatic, no cyanosis. No edema. No clubbing Skin: No rashes or lesions. Or bruising Lymph: Cervical, supraclavicular normal. Neurologic: Grossly non-focal Pertinent Labs Lab Results CBC    Component Value Date/Time   WBC 4.8 04/05/2023 1030   RBC 2.61 (L) 04/05/2023 1030   HGB 7.4 (L) 04/05/2023 1030   HCT 22.9 (L) 04/05/2023 1030   PLT 63 (L) 04/05/2023 1030   MCV 87.7 04/05/2023 1030   MCH 28.4 04/05/2023 1030   MCHC 32.3 04/05/2023 1030   RDW 17.9 (H) 04/05/2023 1030   LYMPHSABS 0.7 07/13/2022 0705   MONOABS 0.6 07/13/2022 0705   EOSABS 0.1 07/13/2022 0705   BASOSABS 0.0 07/13/2022 0705       Latest Ref Rng & Units 04/06/2023    6:56 AM 04/04/2023    6:31 AM 04/02/2023     8:56 AM  CMP  Glucose 70 - 99 mg/dL 161  096  045   BUN 6 - 20 mg/dL 20  23  26    Creatinine 0.44 - 1.00 mg/dL 4.09  8.11  9.14   Sodium 135 - 145 mmol/L 136  136  135   Potassium 3.5 - 5.1 mmol/L 3.4  3.7  3.5   Chloride 98 - 111 mmol/L 102  104  102   CO2 22 - 32 mmol/L 26  24  29    Calcium 8.9 - 10.3 mg/dL 7.9  8.1  7.9   Total Protein 6.5 - 8.1 g/dL 5.8   5.9   Total Bilirubin 0.3 - 1.2 mg/dL 1.1   0.6   Alkaline Phos 38 - 126 U/L 150   150   AST 15 - 41 U/L 63   55   ALT 0 - 44 U/L 46   39       Microbiology: Recent Results (from the past 240 hour(s))  Respiratory (~20 pathogens) panel by PCR     Status: Abnormal   Collection Time: 04/04/23  1:57 PM   Specimen: Nasopharyngeal Swab; Respiratory  Result Value Ref Range Status   Adenovirus NOT DETECTED NOT DETECTED Final   Coronavirus 229E NOT DETECTED NOT DETECTED Final    Comment: (NOTE) The Coronavirus on the Respiratory Panel, DOES NOT test for the novel  Coronavirus (2019 nCoV)    Coronavirus HKU1 NOT DETECTED NOT DETECTED Final   Coronavirus NL63 NOT DETECTED NOT DETECTED Final   Coronavirus OC43 NOT DETECTED NOT DETECTED Final   Metapneumovirus NOT DETECTED NOT DETECTED Final   Rhinovirus / Enterovirus NOT DETECTED NOT DETECTED Final   Influenza A NOT DETECTED NOT DETECTED Final   Influenza B NOT DETECTED NOT DETECTED Final   Parainfluenza Virus 1 NOT DETECTED NOT DETECTED Final   Parainfluenza Virus 2 NOT DETECTED NOT DETECTED Final   Parainfluenza Virus 3 DETECTED (A) NOT DETECTED Final   Parainfluenza Virus 4 NOT DETECTED NOT DETECTED Final   Respiratory Syncytial Virus NOT DETECTED NOT DETECTED Final   Bordetella pertussis NOT DETECTED NOT DETECTED Final   Bordetella Parapertussis NOT DETECTED NOT DETECTED Final   Chlamydophila pneumoniae NOT DETECTED NOT DETECTED Final   Mycoplasma pneumoniae NOT DETECTED NOT DETECTED Final    Comment: Performed at [facility], [address], [place], [place]  [address]  Culture, blood (Routine X 2) w Reflex to ID Panel     Status: None (Preliminary result)   Collection Time: 04/04/23  2:52 PM   Specimen: BLOOD  Result Value Ref Range Status   Specimen Description BLOOD BLOOD RIGHT HAND  Final   Special Requests   Final    BOTTLES DRAWN AEROBIC AND ANAEROBIC Blood Culture results may not be optimal due to an excessive volume of blood received in culture bottles   Culture   Final    NO GROWTH 2 DAYS Performed at [facility], [address], [place], [place] [address]    Report Status PENDING  Incomplete  Culture, blood (Routine X 2) w Reflex to ID Panel     Status: Abnormal (Preliminary result)   Collection Time: 04/04/23  2:59 PM   Specimen: BLOOD  Result Value Ref Range Status   Specimen Description   Final    BLOOD BLOOD LEFT WRIST Performed at [facility], [address] [place], [place] [address]    Special Requests   Final    BOTTLES DRAWN AEROBIC AND ANAEROBIC Blood  Culture results may not be optimal due to an excessive volume of blood received in culture bottles Performed at [facility], [address] [place], [place] [address]    Culture  Setup Time   Final    GRAM NEGATIVE RODS AEROBIC BOTTLE ONLY CRITICAL RESULT CALLED TO, READ BACK BY AND VERIFIED WITH: NATHAN BELUE@0526  04/05/23 LFD    Culture (A)  Final    ESCHERICHIA COLI SUSCEPTIBILITIES TO FOLLOW Performed at [facility], [address] [place], [place] [address]    Report Status PENDING  Incomplete  Blood Culture ID Panel (Reflexed)     Status: Abnormal   Collection Time: 04/04/23  2:59 PM  Result Value Ref Range Status   Enterococcus faecalis NOT DETECTED NOT DETECTED Final   Enterococcus Faecium NOT DETECTED NOT DETECTED Final   Listeria monocytogenes NOT DETECTED NOT DETECTED Final   Staphylococcus species NOT DETECTED NOT DETECTED Final   Staphylococcus aureus (BCID) NOT DETECTED NOT DETECTED Final   Staphylococcus  epidermidis NOT DETECTED NOT DETECTED Final   Staphylococcus lugdunensis NOT DETECTED NOT DETECTED Final   Streptococcus species NOT DETECTED NOT DETECTED Final   Streptococcus agalactiae NOT DETECTED NOT DETECTED Final   Streptococcus pneumoniae NOT DETECTED NOT DETECTED Final   Streptococcus pyogenes NOT DETECTED NOT DETECTED Final   A.calcoaceticus-baumannii NOT DETECTED NOT DETECTED Final   Bacteroides fragilis NOT DETECTED NOT DETECTED Final   Enterobacterales DETECTED (A) NOT DETECTED Final    Comment: Enterobacterales represent a large order of gram negative bacteria, not a single organism.   Enterobacter cloacae complex NOT DETECTED NOT DETECTED Final   Escherichia coli DETECTED (A) NOT DETECTED Final    Comment: CRITICAL RESULT CALLED TO, READ BACK BY AND VERIFIED WITH: NATHAN BELUE @ 0526 04/05/23 LFD    Klebsiella aerogenes NOT DETECTED NOT DETECTED Final   Klebsiella oxytoca NOT DETECTED NOT DETECTED Final   Klebsiella pneumoniae NOT DETECTED NOT DETECTED Final   Proteus species NOT DETECTED NOT DETECTED Final   Salmonella species NOT DETECTED NOT DETECTED Final   Serratia marcescens NOT DETECTED NOT DETECTED Final   Haemophilus influenzae NOT DETECTED NOT DETECTED Final   Neisseria meningitidis NOT DETECTED NOT DETECTED Final   Pseudomonas aeruginosa NOT DETECTED NOT DETECTED Final   Stenotrophomonas maltophilia NOT DETECTED NOT DETECTED Final   Candida albicans NOT DETECTED NOT DETECTED Final   Candida auris NOT DETECTED NOT DETECTED Final   Candida glabrata NOT DETECTED NOT DETECTED Final   Candida krusei NOT DETECTED NOT DETECTED Final   Candida parapsilosis NOT DETECTED NOT DETECTED Final   Candida tropicalis NOT DETECTED NOT DETECTED Final   Cryptococcus neoformans/gattii NOT DETECTED NOT DETECTED Final   CTX-M ESBL NOT DETECTED NOT DETECTED Final   Carbapenem resistance IMP NOT DETECTED NOT DETECTED Final   Carbapenem resistance KPC NOT DETECTED NOT DETECTED Final    Carbapenem resistance NDM NOT DETECTED NOT DETECTED Final   Carbapenem resist OXA 48 LIKE NOT DETECTED NOT DETECTED Final   Carbapenem resistance VIM NOT DETECTED NOT DETECTED Final    Comment: Performed at [facility], [address] [place], [place] [address]  Culture, blood (single) w Reflex to ID Panel     Status: None (Preliminary result)   Collection Time: 04/05/23  9:30 AM   Specimen: BLOOD  Result Value Ref Range Status   Specimen Description   Final    BLOOD CENTRAL LINE Performed at [facility], [address] [place], [place] [address]  Special Requests   Final    BOTTLES DRAWN AEROBIC AND ANAEROBIC Blood Culture adequate volume Performed at [facility], [address] [place], [place] [address]    Culture  Setup Time   Final    GRAM NEGATIVE RODS IN BOTH AEROBIC AND ANAEROBIC BOTTLES CRITICAL RESULT CALLED TO, READ BACK BY AND VERIFIED WITH: CAROLINE COULTER 04/05/2023 2020 CP    Culture   Final    GRAM NEGATIVE RODS CULTURE REINCUBATED FOR BETTER GROWTH Performed at [facility], [address], [place], [place] [address]    Report Status PENDING  Incomplete    IMAGING RESULTS: CT abdomen and pelvis Normal appearance of stomach with a small low-density collection along the left side of the proximal stomach near the greater curvature.  It measures 2.3-1.9 into 2.3 cm and new from previous examination Enterocutaneous fistula along the anterior left lower abdomen Partial colectomy  I have personally reviewed the films ? Impression/Recommendation E. coli bacteremia.  Likely source is intra-abdominal source There is a small fluid collection near the stomach and this could be an abscess She does have a significant enterocutaneous fistula which is a high output fistula She has had a port for nearly 3 months.  But doubt the port is source of E. 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 ___________________________________________________ Discussed with patient, and requesting provider Note:  This document was prepared using Dragon voice recognition software and may include unintentional dictation errors.

## 2023-04-06 NOTE — Progress Notes (Signed)
PHARMACY - TOTAL PARENTERAL NUTRITION CONSULT NOTE   Indication: Prolonged ileus  Patient Measurements: Height: 4\' 11"  (149.9 cm) Weight: 110 kg (242 lb 8.1 oz) IBW/kg (Calculated) : 43.2 TPN AdjBW (KG): 57.8 Body mass index is 48.98 kg/m.  Assessment: Debra [name] is a 57 y.o. female s/p laparotomy, excision of greater omental mass, abdominal wall reconstruction with [name], appendectomy, and placement of Prevena vac.  Glucose / Insulin:  --No apparent history of diabetes --7/25 A1C 6.8 --mSSI 4x/day (0500, 1300, 1800, 2200) --BG last 24h: 92-143 --SSI last 24h: 9 units Electrolytes: Within normal limits Renal: SCr stable at baseline Hepatic: Overall within normal limits TG: 176     (last 6/24=135) GI Imaging: 9/11 CTAP: no new acute issues GI Surgeries / Procedures: s/p laparotomy, excision of greater omental mass, abdominal wall reconstruction with [name], appendectomy, and placement of Prevena vac 12/19/22 placement of right IJ port-a-catheter with dual reservoirs also s/p re-opening of laparotomy for repair of small bowel perforation   Central access: 02/15/2022 TPN start date: 02/15/2022  Nutritional Goals: Goal cyclic TPN over 16 hrs: cyclic TPN over 16 hrs: (provides 115.5 g of protein and 1614 kcals per day) total volume over 16hrs for calculations=1500 ml  RD Assessment:  Estimated Needs Total Energy Estimated Needs: 1800-2100kcal/day Total Protein Estimated Needs: 90-110g/d Total Fluid Estimated Needs: 1.4-1.6L/day  Current Nutrition:  Soft diet + nutritional supplements, not meeting PO needs  Plan:  Transitioned to *Cyclic* TPN on 2/84/13.   to run over 16 hours per MD request. [name] at 2000 per discussion with dietician to allow patient time for walking in afternoon/evenings.  To run over 16 hours: -Start rate at 53 mL/hr for 1 hour. -Increase rate to 105 mL/hr for 13 hours.  -Decrease rate to 53 mL/hr for 1  hour. -Decrease rate to 26 mL/hr for 1 hour, then stop.  Plan:   7/28: Holding TPN per discussion with surgery team given new E coli bacteremia  See cyclic TPN rate above Nutritional Components Amino acids (using 15% Clinisol): 115.5 Dextrose 12% = 180 g  Lipids (using 20% SMOFlipids): 52.2 g kCal: 1614/24h  7/25: Adjusted Dextrose from 17% to 12% at physician request and increase Amino acids from 108g to 115.5g per dietary. Total volume increasing from to  to see if helps with blood sugars Electrolytes in TPN: Na 75 mEq/L, K 50 mEq/L, Ca 44mEq/L, Mg 10 mEq/L, Phos 10 mmol/L, Cl:Ac 1:1 K 3.4   will order KCL 40 meq PO packet x 1 Resume MVI, trace elements, chromium, zinc 10 mg, and selenium 150 mcg per dietary on 03/30/23 Continue CBG/SSI for Cyclic TPN:  -CBG 2 hrs after cyclic TPN start -CBG during middle of cyclic TPN -CBG 1 hr after cylic TPN stopped -CBG while off TPN Continue SSI insulin at moderate scale and remove insulin from bag due to significant decrease in dextrose content. Check TPN labs weekly on Mondays and more frequently if indicated   Taejon Irani A 04/06/2023 9:23 AM

## 2023-04-06 NOTE — Progress Notes (Addendum)
[facility] SURGICAL PROGRESS NOTE   Hospital Day(s): 417.   Post op day(s): 108 Days Post-Op.   Interval History:  Patient seen and examined Noted events over the weekend Bcx with E coli Also with parainfluenza No abdominal pain this morning No bleeding from [name] No new labs this morning given access issues [name] output unmeasured in last 24 hours; managing effluent independently On cyclic TPN + soft diet  Ambulating well; independent  Review of Systems:  Constitutional: denies fever, chills  HEENT: denies cough or congestion  Respiratory: denies any shortness of breath  Cardiovascular: denies chest pain or palpitations  Gastrointestinal: denied abdominal pain, denied N/V Genitourinary: denies burning with urination or urinary frequency Integumentary: + midline wound (healing; stable)  Vital signs in last 24 hours: [min-max] current  Temp:  [98 F (36.7 C)-98.5 F (36.9 C)] 98.5 F (36.9 C) (07/29 0407) Pulse Rate:  [79-89] 79 (07/29 0407) Resp:  [16-20] 20 (07/29 0407) BP: (96-116)/(54-65) 96/58 (07/29 0407) SpO2:  [95 %-98 %] 95 % (07/29 0407) Weight:  [110 kg] 110 kg (07/29 0500)     Height: 4\' 11"  (149.9 cm) Weight: 110 kg BMI (Calculated): 46.02   Intake/Output last 2 shifts:  07/28 0701 - 07/29 0700 In: 340 [P.O.:240; IV Piggyback:100] Out: -    Physical Exam:  Constitutional: alert, cooperative and no distress  Respiratory: breathing non-labored at rest  Cardiovascular: regular rate and sinus rhythm  Gastrointestinal: Soft, non-tender, non-distended, no rebound/guarding. Integumentary: Midline wound healing by secondary intention, peritoneum closed; granulating and new skin growing over majority of wound now; there are three stomatized areas visible in the LUQ and LLQ portions of the wound. No active bleeding this morning  Chest: Port to the right chest; no erythema   Labs:     Latest Ref Rng & Units 04/05/2023   10:30 AM 04/04/2023     6:31 AM 03/31/2023    8:34 AM  CBC  WBC 4.0 - 10.5 K/uL 4.8  8.1  3.3   Hemoglobin 12.0 - 15.0 g/dL 7.4  8.2  8.2   Hematocrit 36.0 - 46.0 % 22.9  26.0  26.0   Platelets 150 - 400 K/uL 63  86  72       Latest Ref Rng & Units 04/06/2023    6:56 AM 04/04/2023    6:31 AM 04/02/2023    8:56 AM  CMP  Glucose 70 - 99 mg/dL 604  540  981   BUN 6 - 20 mg/dL 20  23  26    Creatinine 0.44 - 1.00 mg/dL 1.91  4.78  2.95   Sodium 135 - 145 mmol/L 136  136  135   Potassium 3.5 - 5.1 mmol/L 3.4  3.7  3.5   Chloride 98 - 111 mmol/L 102  104  102   CO2 22 - 32 mmol/L 26  24  29    Calcium 8.9 - 10.3 mg/dL 7.9  8.1  7.9   Total Protein 6.5 - 8.1 g/dL 5.8   5.9   Total Bilirubin 0.3 - 1.2 mg/dL 1.1   0.6   Alkaline Phos 38 - 126 U/L 150   150   AST 15 - 41 U/L 63   55   ALT 0 - 44 U/L 46   39     Imaging studies: No new imaging studies this morning    Assessment/Plan:  57 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  New 07/29: - Will need to remove port this afternoon secondary to bacteremia; consent placed - On Ceftriaxone - Will need PICC on bacteremia treated - repeat Bcx in AM,  - Likely need to hold TPN given lack of access at the moment in setting of bacteremia - Follow up nutritional labs once available   - Unfortunately, this will delay planned operation for this week; timing TBD  - Wound Care ([name] Pouch); [name] pouch; continues managing effluent independently. Intermittent oozing from time to time. Change as needed.   - Continue soft diet as tolerated  - Continue cyclic TPN; weekly nutritional labs - Appreciate dietary assistance - monitoring weight              - Monitor abdominal examination; on-going bowel function            - Pain control prn; antemetic prn            - Mobilizing without issue   - Discharge Planning; Surgery delayed secondary to bacteremia; timing pending    All of the above findings and recommendations were discussed with the patient, and the medical team, and all of patient's questions were answered to her expressed satisfaction.  -- [name], PA-C [facility] 04/06/2023, 8:02 AM M-F: 7am - 4pm

## 2023-04-06 NOTE — Plan of Care (Signed)
Patient has remained free of injury or falls. VSS. Patient denies complaints. Patient continues to receive IV antibiotics. New blood culture result showed infection in the patients port. Nursing will continue to monitor for progress towards goals.

## 2023-04-06 NOTE — Plan of Care (Signed)
  Problem: Clinical Measurements: Goal: Ability to maintain clinical measurements within normal limits will improve Outcome: Progressing Goal: Will remain free from infection Outcome: Progressing Goal: Diagnostic test results will improve Outcome: Progressing Goal: Respiratory complications will improve Outcome: Progressing Goal: Cardiovascular complication will be avoided Outcome: Progressing   Problem: Clinical Measurements: Goal: Will remain free from infection Outcome: Progressing   Problem: Nutrition: Goal: Adequate nutrition will be maintained Outcome: Progressing   Problem: Activity: Goal: Risk for activity intolerance will decrease Outcome: Progressing   Problem: Nutrition: Goal: Adequate nutrition will be maintained Outcome: Progressing   Problem: Coping: Goal: Level of anxiety will decrease Outcome: Progressing   Problem: Pain Managment: Goal: General experience of comfort will improve Outcome: Progressing   

## 2023-04-06 NOTE — Procedures (Signed)
Port Removal After informed consent obtained , pt was placed supine position. She was prepped and draped in the standard fashion. Lidocaine 1% w epi used to infiltrate the skin. Incision created 10 blade knie and hemostastats used to dissect the sub q. Post identified and prolene sutures cut. Port removed while asking pt for valsalva. Hemostassi achieved w pressure. Wound packed w gauze. No complicaitons

## 2023-04-07 LAB — GLUCOSE, CAPILLARY
Glucose-Capillary: 122 mg/dL — ABNORMAL HIGH (ref 70–99)
Glucose-Capillary: 149 mg/dL — ABNORMAL HIGH (ref 70–99)
Glucose-Capillary: 130 mg/dL — ABNORMAL HIGH (ref 70–99)
Glucose-Capillary: 117 mg/dL — ABNORMAL HIGH (ref 70–99)

## 2023-04-07 LAB — CULTURE, BLOOD (ROUTINE X 2)

## 2023-04-07 MED ORDER — GUAIFENESIN ER 600 MG PO TB12
600.0000 mg | ORAL_TABLET | Freq: Two times a day (BID) | ORAL | Status: DC
  Administered 2023-04-07 – 2023-04-27 (×23): 600 mg via ORAL
  Filled 2023-04-07 (×36): qty 1

## 2023-04-07 NOTE — Progress Notes (Signed)
Date of Admission:  02/13/2022     ID: Debra [name] is a 57 y.o. female Principal Problem:   Enterocutaneous fistula Active Problems:   S/P hernia repair   Closed wedge compression fracture of first lumbar vertebra (HCC)    Subjective: Doing better Some cough  Medications:   alteplase  2 mg Intracatheter Once   Chlorhexidine Gluconate Cloth  6 each Topical Daily   feeding supplement  237 mL Oral BID BM   furosemide  20 mg Oral BID   guaiFENesin  600 mg Oral BID   insulin aspart  0-15 Units Subcutaneous QID   levothyroxine  137 mcg Oral Q0600   pantoprazole  40 mg Oral BID   pregabalin  200 mg Oral TID   sodium chloride flush  10-40 mL Intracatheter Q12H   vitamin A  30,000 Units Oral Daily   Vitamin D (Ergocalciferol)  50,000 Units Oral Q7 days    Objective: Vital signs in last 24 hours: Patient Vitals for the past 24 hrs:  BP Temp Temp src Pulse Resp SpO2  04/07/23 1629 117/62 97.7 F (36.5 C) Oral 87 20 99 %  04/07/23 0739 (!) 94/55 98 F (36.7 C) Oral 69 16 98 %  04/07/23 0435 (!) 93/59 97.9 F (36.6 C) Oral 70 18 96 %  04/06/23 1918 (!) 99/52 98.2 F (36.8 C) Oral 73 20 97 %       PHYSICAL EXAM:  General: Alert, cooperative, no distress, appears stated age.  Lungs: Clear to auscultation bilaterally. No Wheezing or Rhonchi. No rales. Heart: Regular rate and rhythm, no murmur, rub or gallop. Abdomen: Soft, ad wall wound dehiscence. Extremities: atraumatic, no cyanosis. No edema. No clubbing Skin: No rashes or lesions. Or bruising Lymph: Cervical, supraclavicular normal. Neurologic: Grossly non-focal  Lab Results    Latest Ref Rng & Units 04/05/2023   10:30 AM 04/04/2023    6:31 AM 03/31/2023    8:34 AM  CBC  WBC 4.0 - 10.5 K/uL 4.8  8.1  3.3   Hemoglobin 12.0 - 15.0 g/dL 7.4  8.2  8.2   Hematocrit 36.0 - 46.0 % 22.9  26.0  26.0   Platelets 150 - 400 K/uL 63  86  72        Latest Ref Rng & Units 04/06/2023    6:56 AM 04/04/2023    6:31 AM  04/02/2023    8:56 AM  CMP  Glucose 70 - 99 mg/dL 937  169  678   BUN 6 - 20 mg/dL 20  23  26    Creatinine 0.44 - 1.00 mg/dL 9.38  1.01  7.51   Sodium 135 - 145 mmol/L 136  136  135   Potassium 3.5 - 5.1 mmol/L 3.4  3.7  3.5   Chloride 98 - 111 mmol/L 102  104  102   CO2 22 - 32 mmol/L 26  24  29    Calcium 8.9 - 10.3 mg/dL 7.9  8.1  7.9   Total Protein 6.5 - 8.1 g/dL 5.8   5.9   Total Bilirubin 0.3 - 1.2 mg/dL 1.1   0.6   Alkaline Phos 38 - 126 U/L 150   150   AST 15 - 41 U/L 63   55   ALT 0 - 44 U/L 46   39        Studies/Results: [name] EKG SITE RITE  Result Date: 04/06/2023 If Site Rite image not attached, placement could not be confirmed due to current  cardiac rhythm.    Assessment/Plan: E. coli bacteremia.  Likely source is intra-abdominal source There is a small fluid collection near the stomach and this could be an abscess She does have a significant enterocutaneous fistula which is a high output fistula She has had a port for nearly 3 months.  But doubt the port is source of E. coli bacteremia The port has been removed Patient is currently on ceftriaxone. 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

## 2023-04-07 NOTE — Plan of Care (Signed)
  Problem: Coping: Goal: Level of anxiety will decrease Outcome: Progressing   Problem: Clinical Measurements: Goal: Ability to maintain clinical measurements within normal limits will improve Outcome: Progressing Goal: Will remain free from infection Outcome: Progressing Goal: Diagnostic test results will improve Outcome: Progressing Goal: Respiratory complications will improve Outcome: Progressing Goal: Cardiovascular complication will be avoided Outcome: Progressing

## 2023-04-07 NOTE — Progress Notes (Signed)
[facility] SURGICAL PROGRESS NOTE   Hospital Day(s): 418.   Post op day(s): 109 Days Post-Op.   Interval History:  Patient seen and examined Nothing acute overnight No abdominal pain this morning No bleeding from [facility] output unmeasured in last 24 hours; managing effluent independently On cyclic TPN + soft diet  Ambulating well; independent  Again, with E coli bacteremia; port removed yesterday (07/29). Will repeat BCx to ensure resolution before replacing PICC  Review of Systems:  Constitutional: denies fever, chills  HEENT: denies cough or congestion  Respiratory: denies any shortness of breath  Cardiovascular: denies chest pain or palpitations  Gastrointestinal: denied abdominal pain, denied N/V Genitourinary: denies burning with urination or urinary frequency Integumentary: + midline wound (healing; stable)  Vital signs in last 24 hours: [min-max] current  Temp:  [97.9 F (36.6 C)-98.2 F (36.8 C)] 98 F (36.7 C) (07/30 0739) Pulse Rate:  [69-73] 69 (07/30 0739) Resp:  [16-20] 16 (07/30 0739) BP: (91-99)/(52-59) 94/55 (07/30 0739) SpO2:  [96 %-98 %] 98 % (07/30 0739)     Height: 4\' 11"  (149.9 cm) Weight: 110 kg BMI (Calculated): 46.02   Intake/Output last 2 shifts:  07/29 0701 - 07/30 0700 In: 0  Out: 50 [Stool:50]   Physical Exam:  Constitutional: alert, cooperative and no distress  Respiratory: breathing non-labored at rest  Cardiovascular: regular rate and sinus rhythm  Gastrointestinal: Soft, non-tender, non-distended, no rebound/guarding. Integumentary: Midline wound healing by secondary intention, peritoneum closed; granulating and new skin growing over majority of wound now; there are three stomatized areas visible in the LUQ and LLQ portions of the wound. No active bleeding this morning  Chest: Right chest port site healing via secondary intention; no erythema    Labs:     Latest Ref Rng & Units 04/05/2023   10:30 AM 04/04/2023     6:31 AM 03/31/2023    8:34 AM  CBC  WBC 4.0 - 10.5 K/uL 4.8  8.1  3.3   Hemoglobin 12.0 - 15.0 g/dL 7.4  8.2  8.2   Hematocrit 36.0 - 46.0 % 22.9  26.0  26.0   Platelets 150 - 400 K/uL 63  86  72       Latest Ref Rng & Units 04/06/2023    6:56 AM 04/04/2023    6:31 AM 04/02/2023    8:56 AM  CMP  Glucose 70 - 99 mg/dL 782  956  213   BUN 6 - 20 mg/dL 20  23  26    Creatinine 0.44 - 1.00 mg/dL 0.86  5.78  4.69   Sodium 135 - 145 mmol/L 136  136  135   Potassium 3.5 - 5.1 mmol/L 3.4  3.7  3.5   Chloride 98 - 111 mmol/L 102  104  102   CO2 22 - 32 mmol/L 26  24  29    Calcium 8.9 - 10.3 mg/dL 7.9  8.1  7.9   Total Protein 6.5 - 8.1 g/dL 5.8   5.9   Total Bilirubin 0.3 - 1.2 mg/dL 1.1   0.6   Alkaline Phos 38 - 126 U/L 150   150   AST 15 - 41 U/L 63   55   ALT 0 - 44 U/L 46   39     Imaging studies: No new imaging studies this morning    Assessment/Plan:  57 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  New 07/30: - Port removed at bedside 07/29; pack wound with gauze daily; cover and secure - Will need TPN holiday while awaiting replacement of central access - Repeat BCx to be drawn this morning  - Continue Ceftriaxone; ID following  - Timing of surgery TBD  - Wound Care ([name] Pouch); [name] pouch; continues managing effluent independently. Intermittent oozing from time to time. Change as needed.   - Continue soft diet as tolerated  - Continue cyclic TPN; SEE ABOVE; weekly nutritional labs - Appreciate dietary assistance - monitoring weight             - Monitor abdominal examination; on-going bowel function            - Pain control prn; antemetic  prn            - Mobilizing without issue   - Discharge Planning; Surgery delayed secondary to bacteremia; timing pending    All of the above findings and recommendations were discussed with the patient, and the medical team, and all of patient's questions were answered to her expressed satisfaction.  -- [name], PA-C [facility] 04/07/2023, 9:22 AM M-F: 7am - 4pm

## 2023-04-07 NOTE — Plan of Care (Signed)
  Problem: Clinical Measurements: Goal: Ability to maintain clinical measurements within normal limits will improve Outcome: Progressing Goal: Will remain free from infection Outcome: Progressing Goal: Diagnostic test results will improve Outcome: Progressing Goal: Respiratory complications will improve Outcome: Progressing Goal: Cardiovascular complication will be avoided Outcome: Progressing   Problem: Clinical Measurements: Goal: Will remain free from infection Outcome: Progressing   Problem: Nutrition: Goal: Adequate nutrition will be maintained Outcome: Progressing   Problem: Activity: Goal: Risk for activity intolerance will decrease Outcome: Progressing   Problem: Nutrition: Goal: Adequate nutrition will be maintained Outcome: Progressing   Problem: Coping: Goal: Level of anxiety will decrease Outcome: Progressing   Problem: Pain Managment: Goal: General experience of comfort will improve Outcome: Progressing   

## 2023-04-08 LAB — GLUCOSE, CAPILLARY
Glucose-Capillary: 105 mg/dL — ABNORMAL HIGH (ref 70–99)
Glucose-Capillary: 99 mg/dL (ref 70–99)
Glucose-Capillary: 141 mg/dL — ABNORMAL HIGH (ref 70–99)
Glucose-Capillary: 153 mg/dL — ABNORMAL HIGH (ref 70–99)
Glucose-Capillary: 110 mg/dL — ABNORMAL HIGH (ref 70–99)

## 2023-04-08 NOTE — Progress Notes (Signed)
[facility] SURGICAL PROGRESS NOTE   Hospital Day(s): 419.   Post op day(s): 110 Days Post-Op.   Interval History:  Patient seen and examined Nothing acute overnight No abdominal pain this morning Intermittent cough; no SOB No bleeding from [name] [name] output unmeasured in last 24 hours; managing effluent independently On cyclic TPN + soft diet  Ambulating well; independent  Review of Systems:  Constitutional: denies fever, chills  HEENT: denies cough or congestion  Respiratory: denies any shortness of breath  Cardiovascular: denies chest pain or palpitations  Gastrointestinal: denied abdominal pain, denied N/V Genitourinary: denies burning with urination or urinary frequency Integumentary: + midline wound (healing; stable)  Vital signs in last 24 hours: [min-max] current  Temp:  [97.7 F (36.5 C)-98.5 F (36.9 C)] 98.2 F (36.8 C) (07/31 0734) Pulse Rate:  [58-87] 58 (07/31 0734) Resp:  [16-20] 16 (07/31 0734) BP: (86-117)/(53-94) 86/62 (07/31 0734) SpO2:  [95 %-100 %] 98 % (07/31 0734) Weight:  [107.9 kg] 107.9 kg (07/31 0434)     Height: 4\' 11"  (149.9 cm) Weight: 107.9 kg BMI (Calculated): 46.02   Intake/Output last 2 shifts:  07/30 0701 - 07/31 0700 In: 720 [P.O.:720] Out: -    Physical Exam:  Constitutional: alert, cooperative and no distress  Respiratory: breathing non-labored at rest  Cardiovascular: regular rate and sinus rhythm  Gastrointestinal: Soft, non-tender, non-distended, no rebound/guarding. Integumentary: Midline wound healing by secondary intention, peritoneum closed; granulating and new skin growing over majority of wound now; there are three stomatized areas visible in the LUQ and LLQ portions of the wound. No active bleeding this morning  Chest: Right chest port site healing via secondary intention; no erythema    Labs:     Latest Ref Rng & Units 04/05/2023   10:30 AM 04/04/2023    6:31 AM 03/31/2023    8:34 AM  CBC  WBC  4.0 - 10.5 K/uL 4.8  8.1  3.3   Hemoglobin 12.0 - 15.0 g/dL 7.4  8.2  8.2   Hematocrit 36.0 - 46.0 % 22.9  26.0  26.0   Platelets 150 - 400 K/uL 63  86  72       Latest Ref Rng & Units 04/06/2023    6:56 AM 04/04/2023    6:31 AM 04/02/2023    8:56 AM  CMP  Glucose 70 - 99 mg/dL 161  096  045   BUN 6 - 20 mg/dL 20  23  26    Creatinine 0.44 - 1.00 mg/dL 4.09  8.11  9.14   Sodium 135 - 145 mmol/L 136  136  135   Potassium 3.5 - 5.1 mmol/L 3.4  3.7  3.5   Chloride 98 - 111 mmol/L 102  104  102   CO2 22 - 32 mmol/L 26  24  29    Calcium 8.9 - 10.3 mg/dL 7.9  8.1  7.9   Total Protein 6.5 - 8.1 g/dL 5.8   5.9   Total Bilirubin 0.3 - 1.2 mg/dL 1.1   0.6   Alkaline Phos 38 - 126 U/L 150   150   AST 15 - 41 U/L 63   55   ALT 0 - 44 U/L 46   39     Imaging studies: No new imaging studies this morning    Assessment/Plan:  57 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  New 07/31: - Port removed at bedside 07/29; pack wound with gauze daily; cover and secure - Will need TPN holiday while awaiting replacement of central access; consider D5, glucose has been normal to mildly elevated  - Repeat BCx (07/30) without growth - PICC tomorrow (08/01) if no growth  - Continue Ceftriaxone; ID following - 10 days - Timing of surgery TBD  - Wound Care ([name] Pouch); [name] pouch; continues managing effluent independently. Intermittent oozing from time to time. Change as needed.   - Continue soft diet as tolerated  - Continue cyclic TPN; SEE ABOVE; weekly nutritional labs - Appreciate dietary assistance - monitoring weight             - Monitor abdominal examination; on-going  bowel function            - Pain control prn; antemetic prn            - Mobilizing without issue   - Discharge Planning; Surgery delayed secondary to bacteremia; timing pending    All of the above findings and recommendations were discussed with the patient, and the medical team, and all of patient's questions were answered to her expressed satisfaction.  -- [name], PA-C [facility] 04/08/2023, 7:40 AM M-F: 7am - 4pm

## 2023-04-08 NOTE — Plan of Care (Signed)
  Problem: Clinical Measurements: Goal: Diagnostic test results will improve Outcome: Progressing   Problem: Clinical Measurements: Goal: Respiratory complications will improve Outcome: Progressing   Problem: Nutrition: Goal: Adequate nutrition will be maintained Outcome: Progressing

## 2023-04-08 NOTE — Progress Notes (Signed)
Date of Admission:  02/13/2022     ID: Debra [name] is a 57 y.o. female Principal Problem:   Enterocutaneous fistula Active Problems:   S/P hernia repair   Closed wedge compression fracture of first lumbar vertebra (HCC)    Subjective: Sitting in chair eating  Medications:   alteplase  2 mg Intracatheter Once   Chlorhexidine Gluconate Cloth  6 each Topical Daily   feeding supplement  237 mL Oral BID BM   furosemide  20 mg Oral BID   guaiFENesin  600 mg Oral BID   insulin aspart  0-15 Units Subcutaneous QID   levothyroxine  137 mcg Oral Q0600   pantoprazole  40 mg Oral BID   pregabalin  200 mg Oral TID   sodium chloride flush  10-40 mL Intracatheter Q12H   vitamin A  30,000 Units Oral Daily   Vitamin D (Ergocalciferol)  50,000 Units Oral Q7 days    Objective: Vital signs in last 24 hours: Patient Vitals for the past 24 hrs:  BP Temp Temp src Pulse Resp SpO2 Weight  04/08/23 0740 (!) 96/54 -- -- 69 16 -- --  04/08/23 0734 (!) 86/62 98.2 F (36.8 C) Oral (!) 58 16 98 % --  04/08/23 0434 -- -- -- -- -- -- 107.9 kg  04/08/23 0433 (!) 95/53 98.3 F (36.8 C) Oral 73 20 95 % --  04/07/23 2007 (!) 103/94 98.5 F (36.9 C) Oral 84 18 100 % --  04/07/23 1629 117/62 97.7 F (36.5 C) Oral 87 20 99 % --       PHYSICAL EXAM:  General: Alert, cooperative, no distress, appears stated age.  Lungs: Clear to auscultation bilaterally. No Wheezing or Rhonchi. No rales. Heart: Regular rate and rhythm, no murmur, rub or gallop. Abdomen: Soft, abd wall wound dehiscence.has wound manager Extremities: atraumatic, no cyanosis. No edema. No clubbing Skin: No rashes or lesions. Or bruising Lymph: Cervical, supraclavicular normal. Neurologic: Grossly non-focal  Lab Results    Latest Ref Rng & Units 04/05/2023   10:30 AM 04/04/2023    6:31 AM 03/31/2023    8:34 AM  CBC  WBC 4.0 - 10.5 K/uL 4.8  8.1  3.3   Hemoglobin 12.0 - 15.0 g/dL 7.4  8.2  8.2   Hematocrit 36.0 - 46.0 %  22.9  26.0  26.0   Platelets 150 - 400 K/uL 63  86  72        Latest Ref Rng & Units 04/06/2023    6:56 AM 04/04/2023    6:31 AM 04/02/2023    8:56 AM  CMP  Glucose 70 - 99 mg/dL 725  366  440   BUN 6 - 20 mg/dL 20  23  26    Creatinine 0.44 - 1.00 mg/dL 3.47  4.25  9.56   Sodium 135 - 145 mmol/L 136  136  135   Potassium 3.5 - 5.1 mmol/L 3.4  3.7  3.5   Chloride 98 - 111 mmol/L 102  104  102   CO2 22 - 32 mmol/L 26  24  29    Calcium 8.9 - 10.3 mg/dL 7.9  8.1  7.9   Total Protein 6.5 - 8.1 g/dL 5.8   5.9   Total Bilirubin 0.3 - 1.2 mg/dL 1.1   0.6   Alkaline Phos 38 - 126 U/L 150   150   AST 15 - 41 U/L 63   55   ALT 0 - 44 U/L 46   39  Studies/Results: No results found.   Assessment/Plan: E. coli bacteremia.  Likely source is intra-abdominal source There is a small fluid collection near the stomach and this could be an abscess She does have a significant enterocutaneous fistula which is a high output fistula She has had a port for nearly 3 months.  But doubt the port is source of E. coli bacteremia The port has been removed Patient is currently on ceftriaxone. 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

## 2023-04-08 NOTE — Plan of Care (Signed)
  Problem: Clinical Measurements: Goal: Ability to maintain clinical measurements within normal limits will improve Outcome: Progressing   

## 2023-04-09 ENCOUNTER — Other Ambulatory Visit: Payer: Self-pay

## 2023-04-09 ENCOUNTER — Encounter: Admission: RE | Disposition: E | Payer: Self-pay | Source: Home / Self Care | Attending: Surgery

## 2023-04-09 LAB — GLUCOSE, CAPILLARY
Glucose-Capillary: 94 mg/dL (ref 70–99)
Glucose-Capillary: 110 mg/dL — ABNORMAL HIGH (ref 70–99)
Glucose-Capillary: 130 mg/dL — ABNORMAL HIGH (ref 70–99)
Glucose-Capillary: 170 mg/dL — ABNORMAL HIGH (ref 70–99)
Glucose-Capillary: 143 mg/dL — ABNORMAL HIGH (ref 70–99)

## 2023-04-09 LAB — CULTURE, BLOOD (ROUTINE X 2): Culture: NO GROWTH

## 2023-04-09 SURGERY — EXPLORATORY LAPAROTOMY
Anesthesia: General

## 2023-04-09 MED ORDER — POTASSIUM CHLORIDE CRYS ER 20 MEQ PO TBCR
40.0000 meq | EXTENDED_RELEASE_TABLET | Freq: Once | ORAL | Status: AC
  Administered 2023-04-09: 40 meq via ORAL
  Filled 2023-04-09: qty 2

## 2023-04-09 MED ORDER — MAGNESIUM SULFATE 2 GM/50ML IV SOLN
2.0000 g | Freq: Once | INTRAVENOUS | Status: AC
  Administered 2023-04-09: 2 g via INTRAVENOUS
  Filled 2023-04-09: qty 50

## 2023-04-09 MED ORDER — ZINC CHLORIDE 1 MG/ML IV SOLN
INTRAVENOUS | Status: AC
  Filled 2023-04-09: qty 385

## 2023-04-09 NOTE — Progress Notes (Signed)
Peripherally Inserted Central Catheter Placement  The IV Nurse has discussed with the patient and/or persons authorized to consent for the patient, the purpose of this procedure and the potential benefits and risks involved with this procedure.  The benefits include less needle sticks, lab draws from the catheter, and the patient may be discharged home with the catheter. Risks include, but not limited to, infection, bleeding, blood clot (thrombus formation), and puncture of an artery; nerve damage and irregular heartbeat and possibility to perform a PICC exchange if needed/ordered by physician.  Alternatives to this procedure were also discussed.  Bard Power PICC patient education guide, fact sheet on infection prevention and patient information card has been provided to patient /or left at bedside.    PICC Placement Documentation  PICC Double Lumen 04/09/23 Right Cephalic 41 cm 0 cm (Active)  Indication for Insertion or Continuance of Line Administration of hyperosmolar/irritating solutions (i.e. TPN, Vancomycin, etc.) 04/09/23 1000  Exposed Catheter (cm) 0 cm 04/09/23 1000  Site Assessment Clean, Dry, Intact 04/09/23 1000  Lumen #1 Status Flushed;Saline locked;Blood return noted 04/09/23 1000  Lumen #2 Status Saline locked;Flushed;Blood return noted 04/09/23 1000  Dressing Type Transparent;Securing device 04/09/23 1000  Dressing Status Antimicrobial disc in place;Clean, Dry, Intact 04/09/23 1000  Safety Lock Not Applicable 04/09/23 1000  Line Care Connections checked and tightened 04/09/23 1000  Line Adjustment (NICU/IV Team Only) No 04/09/23 1000  Dressing Intervention New dressing 04/09/23 1000  Dressing Change Due 04/16/23 04/09/23 1000       Franne Grip Renee 04/09/2023, 10:11 AM

## 2023-04-09 NOTE — Progress Notes (Signed)
Nutrition Follow-up  DOCUMENTATION CODES:   Obesity unspecified  INTERVENTION:   TPN per pharmacy- continue cyclic (16 hrs)- Recommend starting at 1/2 rate as pt is likely at refeed risk.    Ensure Enlive po BID, each supplement provides 350 kcal and 20 grams of protein.  Continue MVI, chromium and trace elements in TPN   Daily weights   Continue zinc additional 10mg  daily added to TPN (increased 6/21)  Continue Selenium additional daily added to TPN (increased 6/21)  Continue Vitamin A 30,000 units po daily (increased 7/25)   Ergocalciferol 50,000 units po weekly x 6 weeks (initiated 7/24)  Will check routine vitamin labs every 3 months, next due 10/21  NUTRITION DIAGNOSIS:   Increased nutrient needs related to wound healing, catabolic illness as evidenced by estimated needs. -ongoing   GOAL:   Patient will meet greater than or equal to 90% of their needs -not met   MONITOR:   PO intake, Supplement acceptance, Labs, Weight trends, Diet advancement, I & O's, TPN  ASSESSMENT:   57 y/o female with h/o hypothyroidism, COVID 19 (3/21), kidney stones and stage 3 colon cancer (s/p left hemicolectomy 5/21 and chemotherapy) who is admitted for new pelvic mass now s/p laparotomy 6/8 (with excision of pelvic mass from greater omentum, abdominal wall reconstruction with bilateral myocutaneous flaps and mesh, incisional hernia repair, appendectomy repair and VAC placement) complicated by bowel perforation s/p reopening of recent laparotomy 6/10 (with repair of small bowel perforation, excision of mesh, placement of two phasix mesh and VAC placement). Pathology returned as metastatic adenocarcinoma. Pt with L1 compression fracture.   Pt s/p PICC line today  Pt noted to be flu positive on 7/28. Pt also noted to have E. Coli bacteremia. Port removed 7/29. CT abdomen from 7/24 showed EC fistula, small ventral hernia and a new fluid collection adjacent to the proximal stomach  measurin 2.3 cm. Surgery on hold for now. TPN discontinued 7/28. PICC line placed today. TPN will restart tonight at 1800. Pt is likely at refeed risk. Pt remains up ~23lbs from her admit weight. Pt continues to have poor appetite and oral intake; pt eating <25% of meals.   Medications reviewed and include: lasix, insulin, synthroid, protonix, vitamin A, vitamin D, TPN  -Selenium- 98 wnl, zinc- 67 wnl, iodine- 49.8 wnl, chromium- 2.3(H), copper- 93 wnl, manganese- pend- 7/22 -vitamin D- 39.67 wnl, vitamin A 12.6(L), -Vitamin B1- 70.3 wnl, B6- 16.4 wnl, vitamin C- 0.4 wnl, vitamin E- 11.8 wnl, vitamin K 1.43 wnl- 7/22 -Ferritin 21, folate 14.8 wnl, B12- 870 wnl- 7/22 -carnitine 32.0 wnl- 4/12 -aluminum 10(H)- 4/12 --Iron 25(L), TIBC 398- 4/12  Labs reviewed: Na 135 wnl, K 3.1(L), P 3.1 wnl, Mg 1.8 wnl Triglycerides- 170- wnl- 8/1 Hgb 7.4(L), Hct 22.9(L)- 7/28 Cbgs-  130, 110, 94 x 24 hrs  Diet Order:    Diet Order             DIET SOFT Fluid consistency: Thin  Diet effective now                  EDUCATION NEEDS:   Not appropriate for education at this time  Skin:  Skin Assessment: Reviewed RN Assessment (EC fistula)  Last BM:  7/29  Height:   Ht Readings from Last 1 Encounters:  12/19/22 4\' 11"  (1.499 m)    Weight:   Wt Readings from Last 1 Encounters:  04/09/23 107 kg    Ideal Body Weight:  44.3 kg  BMI:  Body mass index is 47.64 kg/m.  Estimated Nutritional Needs:   Kcal:  1800-2100kcal/day  Protein:  90-110g/d  Fluid:  1.4-1.6L/day  Debra Holiday MS, RD, LDN Please refer to [facility] for RD and/or RD on-call/weekend/after hours pager

## 2023-04-09 NOTE — Progress Notes (Addendum)
[facility] SURGICAL PROGRESS NOTE   Hospital Day(s): 420.   Post op day(s): 111 Days Post-Op.   Interval History:  Patient seen and examined Nothing acute overnight No bleeding from [facility] output unmeasured in last 24 hours; managing effluent independently On cyclic TPN + soft diet  Ambulating well; independent  Review of Systems:  Constitutional: denies fever, chills  HEENT: denies cough or congestion  Respiratory: denies any shortness of breath  Cardiovascular: denies chest pain or palpitations  Gastrointestinal: denied abdominal pain, denied N/V Genitourinary: denies burning with urination or urinary frequency Integumentary: + midline wound (healing; stable)  Vital signs in last 24 hours: [min-max] current  Temp:  [97.9 F (36.6 C)-98.5 F (36.9 C)] 98 F (36.7 C) (08/01 0735) Pulse Rate:  [67-87] 67 (08/01 0735) Resp:  [16-20] 16 (08/01 0735) BP: (100-131)/(56-69) 103/56 (08/01 0735) SpO2:  [94 %-99 %] 95 % (08/01 0735) Weight:  [469 kg] 107 kg (08/01 0354)     Height: 4\' 11"  (149.9 cm) Weight: 107 kg BMI (Calculated): 46.02   Intake/Output last 2 shifts:  07/31 0701 - 08/01 0700 In: 240 [P.O.:240] Out: -    Physical Exam:  Constitutional: alert, cooperative and no distress  Respiratory: breathing non-labored at rest  Cardiovascular: regular rate and sinus rhythm  Gastrointestinal: Soft, non-tender, non-distended, no rebound/guarding. Integumentary: Midline wound healing by secondary intention, peritoneum closed; granulating and new skin growing over majority of wound now; there are three stomatized areas visible in the LUQ and LLQ portions of the wound. No active bleeding this morning  Chest: Right chest port site healing via secondary intention; no erythema    Labs:     Latest Ref Rng & Units 04/05/2023   10:30 AM 04/04/2023    6:31 AM 03/31/2023    8:34 AM  CBC  WBC 4.0 - 10.5 K/uL 4.8  8.1  3.3   Hemoglobin 12.0 - 15.0 g/dL 7.4  8.2   8.2   Hematocrit 36.0 - 46.0 % 22.9  26.0  26.0   Platelets 150 - 400 K/uL 63  86  72       Latest Ref Rng & Units 04/06/2023    6:56 AM 04/04/2023    6:31 AM 04/02/2023    8:56 AM  CMP  Glucose 70 - 99 mg/dL 629  528  413   BUN 6 - 20 mg/dL 20  23  26    Creatinine 0.44 - 1.00 mg/dL 2.44  0.10  2.72   Sodium 135 - 145 mmol/L 136  136  135   Potassium 3.5 - 5.1 mmol/L 3.4  3.7  3.5   Chloride 98 - 111 mmol/L 102  104  102   CO2 22 - 32 mmol/L 26  24  29    Calcium 8.9 - 10.3 mg/dL 7.9  8.1  7.9   Total Protein 6.5 - 8.1 g/dL 5.8   5.9   Total Bilirubin 0.3 - 1.2 mg/dL 1.1   0.6   Alkaline Phos 38 - 126 U/L 150   150   AST 15 - 41 U/L 63   55   ALT 0 - 44 U/L 46   39     Imaging studies: No new imaging studies this morning    Assessment/Plan:  57 y.o. female with high output enterocutaneous fistula 111 Days Post-Op placement of right IJ port-a-catheter with dual reservoirs also s/p re-opening of laparotomy for repair of small bowel perforation following initial laparotomy, excision of greater omental mass, abdominal  wall reconstruction with [name], appendectomy, and placement of Prevena vac on 06/08.  New 08/01: - Will consult for PICC placement given negative repeat BCx x48 hours  - Restart TPN once central access er-established  - Repeat BCx (07/30) without growth - Continue Ceftriaxone; ID following - 10 days (stop 08/06) - Timing of surgery TBD  - Wound Care ([name] Pouch); [name] pouch; continues managing effluent independently. Intermittent oozing from time to time. Change as needed.   - Continue soft diet as tolerated  - Continue cyclic TPN; weekly nutritional labs - Appreciate dietary assistance - monitoring weight             - Monitor abdominal examination; on-going bowel function            - Pain control prn; antemetic prn            - Mobilizing without issue   - Discharge Planning; Surgery delayed secondary to bacteremia; timing pending    All of  the above findings and recommendations were discussed with the patient, and the medical team, and all of patient's questions were answered to her expressed satisfaction.  -- [name], PA-C [facility] 04/09/2023, 7:45 AM M-F: 7am - 4pm

## 2023-04-09 NOTE — Progress Notes (Signed)
PHARMACY - TOTAL PARENTERAL NUTRITION CONSULT NOTE   Indication: Prolonged ileus  Patient Measurements: Height: 4\' 11"  (149.9 cm) Weight: 107 kg (235 lb 14.3 oz) IBW/kg (Calculated) : 43.2 TPN AdjBW (KG): 57.8 Body mass index is 47.64 kg/m.  Assessment: Debra [name] is a 57 y.o. female s/p laparotomy, excision of greater omental mass, abdominal wall reconstruction with Debra [name] release, appendectomy, and placement of Prevena vac.  Glucose / Insulin:  --Last Hgb A1c 6.8% on 04/02/23 --mSSI 4x/day (0500, 1300, 1800, 2200) --BG last 24h: 94-153 --SSI last 24h: 5 units Electrolytes: Within normal limits Renal: SCr stable at baseline Hepatic: Mild, stable transaminitis. Tbili trending up GI Imaging: 9/11 CTAP: no new acute issues GI Surgeries / Procedures: s/p laparotomy, excision of greater omental mass, abdominal wall reconstruction with Debra [name] release, appendectomy, and placement of Prevena vac 12/19/22 placement of right IJ port-a-catheter with dual reservoirs also s/p re-opening of laparotomy for repair of small bowel perforation   Central access: PICC 8/1 TPN start date: 8/1  Nutritional Goals: Goal cyclic TPN over 16 hrs: cyclic TPN over 16 hrs: (provides 115.5 g of protein and 1614 kcals per day) total volume over 16hrs for calculations=1500 ml  RD Assessment:  Estimated Needs Total Energy Estimated Needs: 1800-2100kcal/day Total Protein Estimated Needs: 90-110g/d Total Fluid Estimated Needs: 1.4-1.6L/day  Current Nutrition:  Soft diet + nutritional supplements, not meeting PO needs  Plan:  Transitioned to *Cyclic* TPN on 8/75/64.   to run over 16 hours per MD request. Debra [name] at 2000 per discussion with dietician to allow patient time for walking in afternoon/evenings.  To run over 16 hours: -Start rate at 53 mL/hr for 1 hour. -Increase rate to 105 mL/hr for 13 hours.  -Decrease rate to 53 mL/hr for 1 hour. -Decrease rate to 26 mL/hr for 1 hour,  then stop.  Plan:   7/28: Holding TPN per discussion with surgery team given new E coli bacteremia 8/1: Plan to re-start TPN upon PICC placement. Will re-start TPN at 1/2 rate, re-check labs tomorrow  See cyclic TPN rate above Nutritional Components Amino acids (using 15% Clinisol): 115.5 Dextrose 12% = 180 g  Lipids (using 20% SMOFlipids): 52.2 g kCal: 1614/24h  7/25: Adjusted Dextrose from 17% to 12% at physician request and increase Amino acids from 108g to 115.5g per dietary. Total volume increasing from to  to see if helps with blood sugars Electrolytes in TPN: Na 50 mEq/L, K 50 mEq/L, Ca 38mEq/L, Mg 10 mEq/L, Phos 10 mmol/L, Cl:Ac 1:1 Resume MVI, trace elements, chromium, zinc 10 mg, and selenium 150 mcg per dietary on 03/30/23 Continue CBG/SSI for Cyclic TPN:  -CBG 2 hrs after cyclic TPN start -CBG during middle of cyclic TPN -CBG 1 hr after cylic TPN stopped -CBG while off TPN Continue SSI insulin at moderate scale and remove insulin from bag due to significant decrease in dextrose content. Check TPN labs weekly on Mondays and more frequently if indicated  Debra [name] 04/09/2023 9:16 AM

## 2023-04-09 NOTE — Plan of Care (Signed)
  Problem: Clinical Measurements: Goal: Will remain free from infection Outcome: Progressing   Problem: Clinical Measurements: Goal: Diagnostic test results will improve Outcome: Progressing   Problem: Clinical Measurements: Goal: Respiratory complications will improve Outcome: Progressing   Problem: Clinical Measurements: Goal: Cardiovascular complication will be avoided Outcome: Progressing   Problem: Clinical Measurements: Goal: Will remain free from infection Outcome: Progressing   Problem: Nutrition: Goal: Adequate nutrition will be maintained Outcome: Progressing   Problem: Activity: Goal: Risk for activity intolerance will decrease Outcome: Progressing

## 2023-04-09 NOTE — TOC Progression Note (Signed)
Transition of Care [facility]) - Progression Note    Patient Details  Name: Debra [name] MRN: [id number] Date of Birth: Feb 21, 1966  Transition of [facility] CM/SW Contact  Truddie Hidden, RN Phone Number: 04/09/2023, 4:34 PM  Clinical Narrative:    TOC assessing for ongoing needs and discharge planning.   Expected Discharge Plan: Home w Home Health Services Barriers to Discharge: Continued Medical Work up  Expected Discharge Plan and Services                                               Social Determinants of Health (SDOH) Interventions SDOH Screenings   Food Insecurity: No Food Insecurity (05/20/2022)  Housing: Low Risk  (05/20/2022)  Transportation Needs: No Transportation Needs (05/20/2022)  Utilities: Not At Risk (05/20/2022)  Tobacco Use: Low Risk  (05/19/2022)    Readmission Risk Interventions     No data to display

## 2023-04-10 LAB — GLUCOSE, CAPILLARY
Glucose-Capillary: 137 mg/dL — ABNORMAL HIGH (ref 70–99)
Glucose-Capillary: 170 mg/dL — ABNORMAL HIGH (ref 70–99)
Glucose-Capillary: 133 mg/dL — ABNORMAL HIGH (ref 70–99)
Glucose-Capillary: 150 mg/dL — ABNORMAL HIGH (ref 70–99)

## 2023-04-10 MED ORDER — ZINC CHLORIDE 1 MG/ML IV SOLN
INTRAVENOUS | Status: AC
  Filled 2023-04-10: qty 770

## 2023-04-10 MED ORDER — K PHOS MONO-SOD PHOS DI & MONO 155-852-130 MG PO TABS
500.0000 mg | ORAL_TABLET | Freq: Once | ORAL | Status: AC
  Administered 2023-04-10: 500 mg via ORAL
  Filled 2023-04-10: qty 2

## 2023-04-10 NOTE — Progress Notes (Signed)
PHARMACY - TOTAL PARENTERAL NUTRITION CONSULT NOTE   Indication: Prolonged ileus  Patient Measurements: Height: 4\' 11"  (149.9 cm) Weight: 106.3 kg (234 lb 5.6 oz) IBW/kg (Calculated) : 43.2 TPN AdjBW (KG): 57.8 Body mass index is 47.33 kg/m.  Assessment: Debra [name] is a 57 y.o. female s/p laparotomy, excision of greater omental mass, abdominal wall reconstruction with [name], appendectomy, and placement of Prevena vac.  Glucose / Insulin:  --Last Hgb A1c 6.8% on 04/02/23 --mSSI 4x/day (0500, 1300, 1800, 2200) --BG last 24h: 110-170 --SSI last 24h: 9 units Electrolytes: Mild hypophosphatemia Renal: SCr stable at baseline Hepatic: Mild, stable transaminitis. Tbili trending up GI Imaging: 9/11 CTAP: no new acute issues GI Surgeries / Procedures: s/p laparotomy, excision of greater omental mass, abdominal wall reconstruction with [name], appendectomy, and placement of Prevena vac 12/19/22 placement of right IJ port-a-catheter with dual reservoirs also s/p re-opening of laparotomy for repair of small bowel perforation   Central access: PICC 8/1 TPN start date: 8/1  Nutritional Goals: Goal cyclic TPN over 16 hrs: cyclic TPN over 16 hrs: (provides 115.5 g of protein and 1614 kcals per day) total volume over 16hrs for calculations=1500 ml  RD Assessment:  Estimated Needs Total Energy Estimated Needs: 1800-2100kcal/day Total Protein Estimated Needs: 90-110g/d Total Fluid Estimated Needs: 1.4-1.6L/day  Current Nutrition:  Soft diet + nutritional supplements, not meeting PO needs  Plan:  Transitioned to *Cyclic* TPN on 02/09/53.   to run over 16 hours per MD request. [name] at 2000 per discussion with dietician to allow patient time for walking in afternoon/evenings.  To run over 16 hours: -Start rate at 53 mL/hr for 1 hour. -Increase rate to 105 mL/hr for 13 hours.  -Decrease rate to 53 mL/hr for 1 hour. -Decrease rate to 26 mL/hr for 1 hour,  then stop.  Plan:   7/28: Holding TPN per discussion with surgery team given new E coli bacteremia 8/1: Plan to re-start TPN upon PICC placement. Will re-start TPN at 1/2 rate, re-check labs tomorrow 8/2: Advance TPN to goal rate  See cyclic TPN rate above Nutritional Components Amino acids (using 15% Clinisol): 115.5 Dextrose 12% = 180 g  Lipids (using 20% SMOFlipids): 52.2 g kCal: 1614/24h  7/25: Adjusted Dextrose from 17% to 12% at physician request and increase Amino acids from 108g to 115.5g per dietary. Total volume increasing from to  to see if helps with blood sugars Electrolytes in TPN: Na 75 mEq/L, K 50 mEq/L, Ca 47mEq/L, Mg 10 mEq/L, Phos 10 mmol/L, Cl:Ac 1:1 Resume MVI, trace elements, chromium, zinc 10 mg, and selenium 150 mcg per dietary on 03/30/23 Continue CBG/SSI for Cyclic TPN:  -CBG 2 hrs after cyclic TPN start -CBG during middle of cyclic TPN -CBG 1 hr after cylic TPN stopped -CBG while off TPN Continue SSI insulin at moderate scale and remove insulin from bag due to decrease in dextrose content Check TPN labs weekly on Mondays and more frequently if indicated  [name] 04/10/2023 7:19 AM

## 2023-04-10 NOTE — Plan of Care (Signed)
  Problem: Clinical Measurements: Goal: Will remain free from infection Outcome: Progressing   Problem: Clinical Measurements: Goal: Diagnostic test results will improve Outcome: Progressing   Problem: Clinical Measurements: Goal: Respiratory complications will improve Outcome: Progressing   Problem: Clinical Measurements: Goal: Cardiovascular complication will be avoided Outcome: Progressing   Problem: Clinical Measurements: Goal: Will remain free from infection Outcome: Progressing

## 2023-04-10 NOTE — Progress Notes (Signed)
[facility] SURGICAL PROGRESS NOTE   Hospital Day(s): 421.   Post op day(s): 112 Days Post-Op.   Interval History:  Patient seen and examined Nothing acute overnight No complaints this morning; no abdominal pain  No bleeding from [name] [name] output 200 ccs + unmeasured in last 24 hours; managing effluent independently On cyclic TPN + soft diet  Ambulating well; independent  Review of Systems:  Constitutional: denies fever, chills  HEENT: denies cough or congestion  Respiratory: denies any shortness of breath  Cardiovascular: denies chest pain or palpitations  Gastrointestinal: denied abdominal pain, denied N/V Genitourinary: denies burning with urination or urinary frequency Integumentary: + midline wound (healing; stable)  Vital signs in last 24 hours: [min-max] current  Temp:  [97.8 F (36.6 C)-98.4 F (36.9 C)] 98.4 F (36.9 C) (08/02 0346) Pulse Rate:  [67-75] 73 (08/02 0346) Resp:  [16-18] 18 (08/02 0346) BP: (95-126)/(49-61) 95/49 (08/02 0346) SpO2:  [95 %-100 %] 97 % (08/02 0346) Weight:  [106.3 kg] 106.3 kg (08/02 0346)     Height: 4\' 11"  (149.9 cm) Weight: 106.3 kg BMI (Calculated): 46.02   Intake/Output last 2 shifts:  08/01 0701 - 08/02 0700 In: 1413 [P.O.:720; I.V.:393; IV Piggyback:300] Out: 200 [Stool:200]   Physical Exam:  Constitutional: alert, cooperative and no distress  Respiratory: breathing non-labored at rest  Cardiovascular: regular rate and sinus rhythm  Gastrointestinal: Soft, non-tender, non-distended, no rebound/guarding. Integumentary: Midline wound healing by secondary intention, peritoneum closed; granulating and new skin growing over majority of wound now; there are three stomatized areas visible in the LUQ and LLQ portions of the wound. No active bleeding this morning  Chest: Right chest port site healing via secondary intention; no erythema    Labs:     Latest Ref Rng & Units 04/05/2023   10:30 AM 04/04/2023    6:31  AM 03/31/2023    8:34 AM  CBC  WBC 4.0 - 10.5 K/uL 4.8  8.1  3.3   Hemoglobin 12.0 - 15.0 g/dL 7.4  8.2  8.2   Hematocrit 36.0 - 46.0 % 22.9  26.0  26.0   Platelets 150 - 400 K/uL 63  86  72       Latest Ref Rng & Units 04/09/2023    8:24 AM 04/06/2023    6:56 AM 04/04/2023    6:31 AM  CMP  Glucose 70 - 99 mg/dL 93  086  578   BUN 6 - 20 mg/dL 10  20  23    Creatinine 0.44 - 1.00 mg/dL 4.69  6.29  5.28   Sodium 135 - 145 mmol/L 135  136  136   Potassium 3.5 - 5.1 mmol/L 3.1  3.4  3.7   Chloride 98 - 111 mmol/L 102  102  104   CO2 22 - 32 mmol/L 24  26  24    Calcium 8.9 - 10.3 mg/dL 7.9  7.9  8.1   Total Protein 6.5 - 8.1 g/dL 6.1  5.8    Total Bilirubin 0.3 - 1.2 mg/dL 1.5  1.1    Alkaline Phos 38 - 126 U/L 214  150    AST 15 - 41 U/L 70  63    ALT 0 - 44 U/L 47  46      Imaging studies: No new imaging studies this morning    Assessment/Plan:  57 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  New 08/02: - Okay to resume TPN - Repeat BCx (07/30) without growth - Continue Ceftriaxone; ID following - 10 days (stop 08/06) - Timing of surgery TBD  - Wound Care ([name] Pouch); [name] pouch; continues managing effluent independently. Intermittent oozing from time to time. Change as needed.   - Continue soft diet as tolerated  - Continue cyclic TPN; weekly nutritional labs - Appreciate dietary assistance - monitoring weight             - Monitor abdominal examination; on-going bowel function            - Pain control prn; antemetic prn            - Mobilizing without issue   - Discharge Planning; Surgery delayed secondary to bacteremia; timing pending     All of the above findings and recommendations were discussed with the patient, and the medical team, and all of patient's questions were answered to her expressed satisfaction.  -- [name], PA-C [facility] 04/10/2023, 7:13 AM M-F: 7am - 4pm

## 2023-04-10 NOTE — Plan of Care (Signed)
  Problem: Clinical Measurements: Goal: Diagnostic test results will improve Outcome: Progressing   Problem: Clinical Measurements: Goal: Will remain free from infection Outcome: Progressing   Problem: Nutrition: Goal: Adequate nutrition will be maintained Outcome: Progressing   Problem: Activity: Goal: Risk for activity intolerance will decrease Outcome: Progressing   Problem: Coping: Goal: Level of anxiety will decrease Outcome: Progressing

## 2023-04-11 LAB — GLUCOSE, CAPILLARY: Glucose-Capillary: 131 mg/dL — ABNORMAL HIGH (ref 70–99)

## 2023-04-11 MED ORDER — POTASSIUM & SODIUM PHOSPHATES 280-160-250 MG PO PACK
2.0000 | PACK | Freq: Once | ORAL | Status: AC
  Administered 2023-04-11: 2 via ORAL
  Filled 2023-04-11: qty 2

## 2023-04-11 MED ORDER — ZINC CHLORIDE 1 MG/ML IV SOLN
INTRAVENOUS | Status: AC
  Filled 2023-04-11: qty 770

## 2023-04-11 NOTE — Progress Notes (Signed)
EC fistula Recent port infection E coli, likely cross contaminant from PICC manipulation and EC fistula New PICC on right arm  No new issues  Continue TPN and wound care Plan for definitive surgery in 3 weeks

## 2023-04-11 NOTE — Progress Notes (Signed)
PHARMACY - TOTAL PARENTERAL NUTRITION CONSULT NOTE   Indication: Prolonged ileus  Patient Measurements: Height: 4\' 11"  (149.9 cm) Weight: 108 kg (238 lb 1.6 oz) IBW/kg (Calculated) : 43.2 TPN AdjBW (KG): 57.8 Body mass index is 48.09 kg/m.  Assessment: Debra [name] is a 57 y.o. female s/p laparotomy, excision of greater omental mass, abdominal wall reconstruction with Debra [name] release, appendectomy, and placement of Prevena vac.  Glucose / Insulin:  --Last Hgb A1c 6.8% on 04/02/23 --mSSI 4x/day (0500, 1300, 1800, 2200) --BG last 24h: 133-170 --SSI last 24h: 10 units Electrolytes: Mild hypophosphatemia Phos 2.0 Renal: SCr stable at baseline Hepatic: Mild, stable transaminitis. Tbili trending up TG: 8/1: 170 GI Imaging: 9/11 CTAP: no new acute issues GI Surgeries / Procedures: s/p laparotomy, excision of greater omental mass, abdominal wall reconstruction with Debra [name] release, appendectomy, and placement of Prevena vac 12/30/2022 placement of right IJ port-a-catheter with dual reservoirs also s/p re-opening of laparotomy for repair of small bowel perforation   Central access: PICC 8/1/ 2024   TPN start date: 04/27/2023    (was on TPN from 02/06/2022 to 04/05/23- held for bacteremia and TPN restarted 04/18/2023)  Nutritional Goals: Goal cyclic TPN over 16 hrs: cyclic TPN over 16 hrs: (provides 115.5 g of protein and 1614 kcals per day) total volume over 16hrs for calculations=1500 ml  RD Assessment:  Estimated Needs Total Energy Estimated Needs: 1800-2100kcal/day Total Protein Estimated Needs: 90-110g/d Total Fluid Estimated Needs: 1.4-1.6L/day  Current Nutrition:  Soft diet + nutritional supplements, not meeting PO needs  Plan:  Transitioned to *Cyclic* TPN on 2/44/01.   to run over 16 hours per MD request. [name] at 2000 per discussion with dietician to allow patient time for walking in afternoon/evenings.  To run over 16 hours: -Start rate at 53 mL/hr for 1  hour. -Increase rate to 105 mL/hr for 13 hours.  -Decrease rate to 53 mL/hr for 1 hour. -Decrease rate to 26 mL/hr for 1 hour, then stop.  Plan:  8/3: continue cyclic TPN at goal rate  See cyclic TPN rate above Nutritional Components Amino acids (using 15% Clinisol): 115.5 Dextrose 12% = 180 g  Lipids (using 20% SMOFlipids): 52.2 g kCal: 1614/24h  7/25: Adjusted Dextrose from 17% to 12% at physician request and increase Amino acids from 108g to 115.5g per dietary. Total volume increasing from to  to see if helps with blood sugars Electrolytes in TPN: Na 75 mEq/L, K 50 mEq/L, Ca 61mEq/L, Mg 10 mEq/L, Phos 10 mmol/L, Cl:Ac 1:1 Resume MVI, trace elements, chromium, zinc 10 mg, and selenium 150 mcg per dietary on 03/30/23 Continue CBG/SSI for Cyclic TPN:  -CBG 2 hrs after cyclic TPN start -CBG during middle of cyclic TPN -CBG 1 hr after cylic TPN stopped -CBG while off TPN Continue SSI insulin at moderate scale  Phos=2.0   Will order Phos-NaK 2 packets po x 1 dose Check TPN labs weekly on Mondays and more frequently if indicated  , A 04/11/2023 9:22 AM

## 2023-04-11 NOTE — Plan of Care (Signed)
  Problem: Activity: Goal: Risk for activity intolerance will decrease Outcome: Progressing   Problem: Nutrition: Goal: Adequate nutrition will be maintained Outcome: Progressing   

## 2023-04-12 LAB — BASIC METABOLIC PANEL WITH GFR
Anion gap: 4 — ABNORMAL LOW (ref 5–15)
BUN: 20 mg/dL (ref 6–20)
CO2: 25 mmol/L (ref 22–32)
Calcium: 8.1 mg/dL — ABNORMAL LOW (ref 8.9–10.3)
Chloride: 107 mmol/L (ref 98–111)
Creatinine, Ser: 0.48 mg/dL (ref 0.44–1.00)
GFR, Estimated: >60 mL/min (ref >60)
Glucose, Bld: 163 mg/dL — ABNORMAL HIGH (ref 70–99)
Potassium: 3.9 mmol/L (ref 3.5–5.1)
Sodium: 136 mmol/L (ref 135–145)

## 2023-04-12 LAB — GLUCOSE, CAPILLARY
Glucose-Capillary: 94 mg/dL (ref 70–99)
Glucose-Capillary: 171 mg/dL — ABNORMAL HIGH (ref 70–99)

## 2023-04-12 LAB — PHOSPHORUS: Phosphorus: 2.4 mg/dL — ABNORMAL LOW (ref 2.5–4.6)

## 2023-04-12 LAB — MAGNESIUM: Magnesium: 2.4 mg/dL (ref 1.7–2.4)

## 2023-04-12 MED ORDER — POTASSIUM & SODIUM PHOSPHATES 280-160-250 MG PO PACK
1.0000 | PACK | Freq: Once | ORAL | Status: DC
  Filled 2023-04-12: qty 1

## 2023-04-12 MED ORDER — ZINC CHLORIDE 1 MG/ML IV SOLN
INTRAVENOUS | Status: AC
  Filled 2023-04-12: qty 770

## 2023-04-12 NOTE — Progress Notes (Signed)
Now new issues Continue TPN and wound care Right chest wound healing well Family updated

## 2023-04-12 NOTE — Plan of Care (Signed)
  Problem: Nutrition: Goal: Adequate nutrition will be maintained Outcome: Progressing   Problem: Nutrition: Goal: Adequate nutrition will be maintained Outcome: Progressing   Problem: Coping: Goal: Level of anxiety will decrease Outcome: Progressing

## 2023-04-12 NOTE — Progress Notes (Signed)
Patient refusing to eat or take oral meds due to nausea. Zofran given to patient with some relief. Patient sat in chair most of morning, but did return back to bed at lunchtime. Dr. Everlene Farrier updated.    Madie Reno, RN

## 2023-04-12 NOTE — Progress Notes (Signed)
PHARMACY - TOTAL PARENTERAL NUTRITION CONSULT NOTE   Indication: Prolonged ileus  Patient Measurements: Height: 4\' 11"  (149.9 cm) Weight: 107.5 kg (236 lb 15.9 oz) IBW/kg (Calculated) : 43.2 TPN AdjBW (KG): 57.8 Body mass index is 47.87 kg/m.  Assessment: Debra [name] is a 57 y.o. female s/p laparotomy, excision of greater omental mass, abdominal wall reconstruction with [name], appendectomy, and placement of Prevena vac.  Glucose / Insulin:  --Last Hgb A1c 6.8% on 04/02/23 --mSSI 4x/day (0500, 1300, 1800, 2200) --BG last 24h: 131-163 --SSI last 24h: 5 units Electrolytes: Mild hypophosphatemia Phos 2.4 Renal: SCr stable at baseline Hepatic: Mild, stable transaminitis. Tbili trending up TG: 8/1: 170 GI Imaging: 9/11 CTAP: no new acute issues GI Surgeries / Procedures: s/p laparotomy, excision of greater omental mass, abdominal wall reconstruction with [name], appendectomy, and placement of Prevena vac 12/19/22 placement of right IJ port-a-catheter with dual reservoirs also s/p re-opening of laparotomy for repair of small bowel perforation   Central access: PICC 04/09/2023   TPN start date: 04/09/23    (was on TPN from 02/15/22 to 04/05/23- held for bacteremia and TPN restarted 04/09/23)  Nutritional Goals: Goal cyclic TPN over 16 hrs: cyclic TPN over 16 hrs: (provides 115.5 g of protein and 1614 kcals per day) total volume over 16hrs for calculations=1500 ml  RD Assessment:  Estimated Needs Total Energy Estimated Needs: 1800-2100kcal/day Total Protein Estimated Needs: 90-110g/d Total Fluid Estimated Needs: 1.4-1.6L/day  Current Nutrition:  Soft diet + nutritional supplements, not meeting PO needs  Plan:  Transitioned to *Cyclic* TPN on 1/47/82.   to run over 16 hours per MD request. [name] at 2000 per discussion with dietician to allow patient time for walking in afternoon/evenings.  To run over 16 hours: -Start rate at 53 mL/hr for 1  hour. -Increase rate to 105 mL/hr for 13 hours.  -Decrease rate to 53 mL/hr for 1 hour. -Decrease rate to 26 mL/hr for 1 hour, then stop.  Plan:  8/3: continue cyclic TPN at goal rate  See cyclic TPN rate above Nutritional Components Amino acids (using 15% Clinisol): 115.5 Dextrose 12% = 180 g  Lipids (using 20% SMOFlipids): 52.2 g kCal: 1614/24h  7/25: Adjusted Dextrose from 17% to 12% at physician request and increase Amino acids from 108g to 115.5g per dietary. Total volume increasing from to  to see if helps with blood sugars Electrolytes in TPN: Na 75 mEq/L, K 50 mEq/L, Ca 56mEq/L, Mg 10 mEq/L, Phos 10 mmol/L, Cl:Ac 1:1 Resume MVI, trace elements, chromium, zinc 10 mg, and selenium 150 mcg per dietary on 03/30/23 Continue CBG/SSI for Cyclic TPN:  -CBG 2 hrs after cyclic TPN start -CBG during middle of cyclic TPN -CBG 1 hr after cylic TPN stopped -CBG while off TPN Continue SSI insulin at moderate scale  Phos=2.4   Will order Phos-NaK 1 packet po x 1 dose Check TPN labs weekly on Mondays and more frequently if indicated  Bari Mantis PharmD Clinical Pharmacist 04/12/2023

## 2023-04-13 LAB — GLUCOSE, CAPILLARY
Glucose-Capillary: 152 mg/dL — ABNORMAL HIGH (ref 70–99)
Glucose-Capillary: 137 mg/dL — ABNORMAL HIGH (ref 70–99)
Glucose-Capillary: 111 mg/dL — ABNORMAL HIGH (ref 70–99)
Glucose-Capillary: 187 mg/dL — ABNORMAL HIGH (ref 70–99)
Glucose-Capillary: 148 mg/dL — ABNORMAL HIGH (ref 70–99)
Glucose-Capillary: 134 mg/dL — ABNORMAL HIGH (ref 70–99)
Glucose-Capillary: 157 mg/dL — ABNORMAL HIGH (ref 70–99)

## 2023-04-13 MED ORDER — ZINC CHLORIDE 1 MG/ML IV SOLN
INTRAVENOUS | Status: AC
  Filled 2023-04-13: qty 770

## 2023-04-13 MED ORDER — POTASSIUM & SODIUM PHOSPHATES 280-160-250 MG PO PACK
2.0000 | PACK | Freq: Once | ORAL | Status: AC
  Administered 2023-04-13: 2 via ORAL
  Filled 2023-04-13: qty 1

## 2023-04-13 NOTE — Progress Notes (Signed)
Patients gown changed this shift. She requested peri care and lotion to be applied. Patient stable in room at this time.

## 2023-04-13 NOTE — Progress Notes (Signed)
PHARMACY - TOTAL PARENTERAL NUTRITION CONSULT NOTE   Indication: Prolonged ileus  Patient Measurements: Height: 4\' 11"  (149.9 cm) Weight: 107 kg (235 lb 14.3 oz) IBW/kg (Calculated) : 43.2 TPN AdjBW (KG): 57.8 Body mass index is 47.64 kg/m.  Assessment: Debra [name] is a 57 y.o. female s/p laparotomy, excision of greater omental mass, abdominal wall reconstruction with [name], appendectomy, and placement of Prevena vac.  Glucose / Insulin:  --Last Hgb A1c 6.8% on 04/02/23 --mSSI 4x/day (0500, 1300, 1800, 2200) --BG last 24h: 94-187 --SSI last 24h: 6 units Electrolytes: Mild hypophosphatemia Phos 2.2 Renal: SCr stable at baseline Hepatic: Mild, stable transaminitis. Tbili trending up TG: 8/1: 170 GI Imaging: 9/11 CTAP: no new acute issues GI Surgeries / Procedures: s/p laparotomy, excision of greater omental mass, abdominal wall reconstruction with [name], appendectomy, and placement of Prevena vac 12/19/22 placement of right IJ port-a-catheter with dual reservoirs also s/p re-opening of laparotomy for repair of small bowel perforation   Central access: PICC 04/09/2023   TPN start date: 04/09/23    (was on TPN from 02/15/22 to 04/05/23- held for bacteremia and TPN restarted 04/09/23)  Nutritional Goals: Goal cyclic TPN over 16 hrs: cyclic TPN over 16 hrs: (provides 115.5 g of protein and 1614 kcals per day) total volume over 16hrs for calculations=1500 ml  RD Assessment:  Estimated Needs Total Energy Estimated Needs: 1800-2100kcal/day Total Protein Estimated Needs: 90-110g/d Total Fluid Estimated Needs: 1.4-1.6L/day  Current Nutrition:  Soft diet + nutritional supplements, not meeting PO needs  Plan:  Transitioned to *Cyclic* TPN on 1/61/09.   to run over 16 hours per MD request. [name] at 2000 per discussion with dietician to allow patient time for walking in afternoon/evenings.  To run over 16 hours: -Start rate at 53 mL/hr for 1  hour. -Increase rate to 105 mL/hr for 13 hours.  -Decrease rate to 53 mL/hr for 1 hour. -Decrease rate to 26 mL/hr for 1 hour, then stop.  Plan:  8/3: continue cyclic TPN at goal rate  See cyclic TPN rate above Nutritional Components Amino acids (using 15% Clinisol): 115.5 Dextrose 12% = 180 g  Lipids (using 20% SMOFlipids): 52.2 g kCal: 1614/24h  7/25: Adjusted Dextrose from 17% to 12% at physician request and increase Amino acids from 108g to 115.5g per dietary. Total volume increasing from to  to see if helps with blood sugars Electrolytes in TPN: Na 75 mEq/L, K 50 mEq/L, Ca 77mEq/L, Mg 10 mEq/L, Phos 10 mmol/L, Cl:Ac 1:1 Resume MVI, trace elements, chromium, zinc 10 mg, and selenium 150 mcg per dietary on 03/30/23 Continue CBG/SSI for Cyclic TPN:  -CBG 2 hrs after cyclic TPN start -CBG during middle of cyclic TPN -CBG 1 hr after cylic TPN stopped -CBG while off TPN Continue SSI insulin at moderate scale  Phos=2.2   Will order Phos-NaK 2 packets po x 1 dose Check TPN labs weekly on Mondays and more frequently if indicated  Bari Mantis PharmD Clinical Pharmacist 04/13/2023

## 2023-04-13 NOTE — Progress Notes (Signed)
[facility] SURGICAL PROGRESS NOTE   Hospital Day(s): 424.   Post op day(s): 115 Days Post-Op.   Interval History:  Patient seen and examined Nothing acute overnight No bleeding from [facility] output unmeasured in last 24 hours; managing effluent independently On cyclic TPN + soft diet  Ambulating well; independent  Review of Systems:  Constitutional: denies fever, chills  HEENT: denies cough or congestion  Respiratory: denies any shortness of breath  Cardiovascular: denies chest pain or palpitations  Gastrointestinal: denied abdominal pain, denied N/V Genitourinary: denies burning with urination or urinary frequency Integumentary: + midline wound (healing; stable)  Vital signs in last 24 hours: [min-max] current  Temp:  [98 F (36.7 C)-98.4 F (36.9 C)] 98.2 F (36.8 C) (08/05 0546) Pulse Rate:  [65-80] 74 (08/05 0546) Resp:  [16-20] 16 (08/05 0546) BP: (101-115)/(51-63) 107/51 (08/05 0546) SpO2:  [95 %-99 %] 95 % (08/05 0546) Weight:  [107 kg] 107 kg (08/05 0546)     Height: 4\' 11"  (149.9 cm) Weight: 107 kg BMI (Calculated): 46.02   Intake/Output last 2 shifts:  No intake/output data recorded.   Physical Exam:  Constitutional: alert, cooperative and no distress  Respiratory: breathing non-labored at rest  Cardiovascular: regular rate and sinus rhythm  Gastrointestinal: Soft, non-tender, non-distended, no rebound/guarding. Integumentary: Midline wound healing by secondary intention, peritoneum closed; granulating and new skin growing over majority of wound now; there are three stomatized areas visible in the LUQ and LLQ portions of the wound. No active bleeding this morning  Chest: Right chest port site healing via secondary intention; no erythema    Labs:     Latest Ref Rng & Units 04/05/2023   10:30 AM 04/04/2023    6:31 AM 03/31/2023    8:34 AM  CBC  WBC 4.0 - 10.5 K/uL 4.8  8.1  3.3   Hemoglobin 12.0 - 15.0 g/dL 7.4  8.2  8.2   Hematocrit  36.0 - 46.0 % 22.9  26.0  26.0   Platelets 150 - 400 K/uL 63  86  72       Latest Ref Rng & Units 04/12/2023    5:37 AM 04/11/2023    5:13 AM 04/10/2023    6:56 AM  CMP  Glucose 70 - 99 mg/dL 161  096  045   BUN 6 - 20 mg/dL 20  16  11    Creatinine 0.44 - 1.00 mg/dL 4.09  8.11  9.14   Sodium 135 - 145 mmol/L 136  135  134   Potassium 3.5 - 5.1 mmol/L 3.9  4.1  3.6   Chloride 98 - 111 mmol/L 107  106  104   CO2 22 - 32 mmol/L 25  24  24    Calcium 8.9 - 10.3 mg/dL 8.1  8.0  7.9     Imaging studies: No new imaging studies this morning    Assessment/Plan:  57 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  New 08/05: - No acute changes this AM - Continue Ceftriaxone; ID following - 10 days (stop 08/06) - Timing of surgery TBD  - Wound Care ([name] Pouch); [name] pouch; continues managing effluent independently. Intermittent oozing from time to time. Change as needed.   - Continue soft diet  as tolerated  - Continue cyclic TPN; weekly nutritional labs - Appreciate dietary assistance - monitoring weight             - Monitor abdominal examination; on-going bowel function            - Pain control prn; antemetic prn            - Mobilizing without issue   - Discharge Planning; Surgery delayed secondary to bacteremia; timing pending    All of the above findings and recommendations were discussed with the patient, and the medical team, and all of patient's questions were answered to her expressed satisfaction.  -- [name], PA-C [facility] 04/13/2023, 7:52 AM M-F: 7am - 4pm

## 2023-04-13 NOTE — Progress Notes (Signed)
Patient is stable this shift. All interactions with use of interpreter. Patient reports she's feeling tired today. Patient finished TPN. PICC is CDI, flushes and has blood return. Patient receiving IV ABX. BG has been stable. She refused mucinex today. Dressing to old port site was changed. ABD dressing is intact. Patient with generalized edema, given PO lasix this AM. Eating well, no other complaints at this time. Ambulates room independently.

## 2023-04-14 LAB — GLUCOSE, CAPILLARY
Glucose-Capillary: 171 mg/dL — ABNORMAL HIGH (ref 70–99)
Glucose-Capillary: 168 mg/dL — ABNORMAL HIGH (ref 70–99)
Glucose-Capillary: 108 mg/dL — ABNORMAL HIGH (ref 70–99)
Glucose-Capillary: 86 mg/dL (ref 70–99)

## 2023-04-14 MED ORDER — ZINC CHLORIDE 1 MG/ML IV SOLN
INTRAVENOUS | Status: AC
  Filled 2023-04-14: qty 770

## 2023-04-14 NOTE — Progress Notes (Signed)
[facility] SURGICAL PROGRESS NOTE   Hospital Day(s): 425.   Post op day(s): 116 Days Post-Op.   Interval History:  Patient seen and examined Nothing acute overnight No new issues this morning  No bleeding from [facility] output unmeasured in last 24 hours; managing effluent independently On cyclic TPN + soft diet  Ambulating well; independent  Review of Systems:  Constitutional: denies fever, chills  HEENT: denies cough or congestion  Respiratory: denies any shortness of breath  Cardiovascular: denies chest pain or palpitations  Gastrointestinal: denied abdominal pain, denied N/V Genitourinary: denies burning with urination or urinary frequency Integumentary: + midline wound (healing; stable)  Vital signs in last 24 hours: [min-max] current  Temp:  [97.8 F (36.6 C)-98.4 F (36.9 C)] 98.4 F (36.9 C) (08/06 0715) Pulse Rate:  [67-82] 67 (08/06 0715) Resp:  [18] 18 (08/06 0715) BP: (107-123)/(56-76) 111/56 (08/06 0715) SpO2:  [96 %-100 %] 97 % (08/06 0715) Weight:  [107.4 kg] 107.4 kg (08/06 0353)     Height: 4\' 11"  (149.9 cm) Weight: 107.4 kg BMI (Calculated): 46.02   Intake/Output last 2 shifts:  No intake/output data recorded.   Physical Exam:  Constitutional: alert, cooperative and no distress  Respiratory: breathing non-labored at rest  Cardiovascular: regular rate and sinus rhythm  Gastrointestinal: Soft, non-tender, non-distended, no rebound/guarding. Integumentary: Midline wound healing by secondary intention, peritoneum closed; granulating and new skin growing over majority of wound now; there are three stomatized areas visible in the LUQ and LLQ portions of the wound. No active bleeding this morning  Chest: Right chest port site healing via secondary intention; no erythema    Labs:     Latest Ref Rng & Units 04/05/2023   10:30 AM 04/04/2023    6:31 AM 03/31/2023    8:34 AM  CBC  WBC 4.0 - 10.5 K/uL 4.8  8.1  3.3   Hemoglobin 12.0 - 15.0  g/dL 7.4  8.2  8.2   Hematocrit 36.0 - 46.0 % 22.9  26.0  26.0   Platelets 150 - 400 K/uL 63  86  72       Latest Ref Rng & Units 04/13/2023    8:01 AM 04/12/2023    5:37 AM 04/11/2023    5:13 AM  CMP  Glucose 70 - 99 mg/dL 086  578  469   BUN 6 - 20 mg/dL 24  20  16    Creatinine 0.44 - 1.00 mg/dL 6.29  5.28  4.13   Sodium 135 - 145 mmol/L 136  136  135   Potassium 3.5 - 5.1 mmol/L 4.5  3.9  4.1   Chloride 98 - 111 mmol/L 108  107  106   CO2 22 - 32 mmol/L 21  25  24    Calcium 8.9 - 10.3 mg/dL 8.2  8.1  8.0   Total Protein 6.5 - 8.1 g/dL 6.2     Total Bilirubin 0.3 - 1.2 mg/dL 0.8     Alkaline Phos 38 - 126 U/L 198     AST 15 - 41 U/L 58     ALT 0 - 44 U/L 41       Imaging studies: No new imaging studies this morning    Assessment/Plan:  57 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  New 08/06: - No acute changes this AM - Continue Ceftriaxone; ID following - 10 days (stop 08/06) - Surgery 08/20  - Wound Care ([name] Pouch); [name] pouch; continues managing effluent independently. Intermittent oozing from time to time. Change as needed.   - Continue soft diet as tolerated  - Continue cyclic TPN; weekly nutritional labs - Appreciate dietary assistance - monitoring weight             - Monitor abdominal examination; on-going bowel function            - Pain control prn; antemetic prn            - Mobilizing without issue   - Discharge Planning; Surgery delayed secondary to bacteremia; timing pending    All of the above findings and recommendations were discussed with the patient, and the medical team, and all of patient's questions were answered to her  expressed satisfaction.  -- [name], PA-C [facility] 04/14/2023, 7:22 AM M-F: 7am - 4pm

## 2023-04-14 NOTE — Progress Notes (Signed)
PHARMACY - TOTAL PARENTERAL NUTRITION CONSULT NOTE   Indication: Prolonged ileus  Patient Measurements: Height: 4\' 11"  (149.9 cm) Weight: 107.4 kg (236 lb 12.4 oz) IBW/kg (Calculated) : 43.2 TPN AdjBW (KG): 57.8 Body mass index is 47.82 kg/m.  Assessment: Debra [name] is a 57 y.o. female s/p laparotomy, excision of greater omental mass, abdominal wall reconstruction with [name], appendectomy, and placement of Prevena vac.  Glucose / Insulin:  --Last Hgb A1c 6.8% on 04/02/23 --mSSI 4x/day (0500, 1300, 1800, 2200) --BG last 24h: 134-171 --SSI last 24h: 10 units Electrolytes: Mild hypophosphatemia Phos 2.2 Renal: SCr stable at baseline Hepatic: Mild, stable transaminitis. Tbili trending up TG: 8/1: 170 GI Imaging: 9/11 CTAP: no new acute issues GI Surgeries / Procedures: s/p laparotomy, excision of greater omental mass, abdominal wall reconstruction with [name], appendectomy, and placement of Prevena vac 12/19/22 placement of right IJ port-a-catheter with dual reservoirs also s/p re-opening of laparotomy for repair of small bowel perforation   Central access: PICC 04/09/2023   TPN start date: 04/09/23    (was on TPN from 02/15/22 to 04/05/23- held for bacteremia and TPN restarted 04/09/23)  Nutritional Goals: Goal cyclic TPN over 16 hrs: cyclic TPN over 16 hrs: (provides 115.5 g of protein and 1614 kcals per day) total volume over 16hrs for calculations=1500 ml  RD Assessment:  Estimated Needs Total Energy Estimated Needs: 1800-2100kcal/day Total Protein Estimated Needs: 90-110g/d Total Fluid Estimated Needs: 1.4-1.6L/day  Current Nutrition:  Soft diet + nutritional supplements, not meeting PO needs  Plan:  Transitioned to *Cyclic* TPN on 1/61/09.   to run over 16 hours per MD request. [name] at 2000 per discussion with dietician to allow patient time for walking in afternoon/evenings.  To run over 16 hours: -Start rate at 53 mL/hr for 1  hour. -Increase rate to 105 mL/hr for 13 hours.  -Decrease rate to 53 mL/hr for 1 hour. -Decrease rate to 26 mL/hr for 1 hour, then stop.  Plan:  8/3: continue cyclic TPN at goal rate  See cyclic TPN rate above Nutritional Components Amino acids (using 15% Clinisol): 115.5 Dextrose 12% = 180 g  Lipids (using 20% SMOFlipids): 52.2 g kCal: 1614/24h  7/25: Adjusted Dextrose from 17% to 12% at physician request and increase Amino acids from 108g to 115.5g per dietary. Total volume increasing from to  to see if helps with blood sugars Electrolytes in TPN: Na 75 mEq/L, K 50 mEq/L, Ca 18mEq/L, Mg 10 mEq/L, Phos 10 mmol/L, Cl:Ac 1:1 Resume MVI, trace elements, chromium, zinc 10 mg, and selenium 150 mcg per dietary on 03/30/23 Continue CBG/SSI for Cyclic TPN:  -CBG 2 hrs after cyclic TPN start -CBG during middle of cyclic TPN -CBG 1 hr after cylic TPN stopped -CBG while off TPN Continue SSI insulin at moderate scale  Phos=2.2   Will order Phos-NaK 2 packets po x 1 dose(Surgeon okay with holding off on recheck until biweekly labs due) Check TPN labs weekly on Mondays and more frequently if indicated  Bettey Costa, PharmD Clinical Pharmacist 04/14/2023 8:05 AM

## 2023-04-15 LAB — GLUCOSE, CAPILLARY
Glucose-Capillary: 207 mg/dL — ABNORMAL HIGH (ref 70–99)
Glucose-Capillary: 190 mg/dL — ABNORMAL HIGH (ref 70–99)
Glucose-Capillary: 114 mg/dL — ABNORMAL HIGH (ref 70–99)
Glucose-Capillary: 197 mg/dL — ABNORMAL HIGH (ref 70–99)

## 2023-04-15 MED ORDER — ZINC CHLORIDE 1 MG/ML IV SOLN
INTRAVENOUS | Status: AC
  Filled 2023-04-15: qty 770

## 2023-04-15 NOTE — Progress Notes (Signed)
PHARMACY - TOTAL PARENTERAL NUTRITION CONSULT NOTE   Indication: Prolonged ileus  Patient Measurements: Height: 4\' 11"  (149.9 cm) Weight: 107.6 kg (237 lb 3.4 oz) IBW/kg (Calculated) : 43.2 TPN AdjBW (KG): 57.8 Body mass index is 47.91 kg/m.  Assessment: Debra [name] is a 57 y.o. female s/p laparotomy, excision of greater omental mass, abdominal wall reconstruction with [name], appendectomy, and placement of Prevena vac.  Glucose / Insulin:  --Last Hgb A1c 6.8% on 04/02/23 --mSSI 4x/day (0500, 1300, 1800, 2200) --BG last 24h: 86-207 --SSI last 24h: 5 units Electrolytes: Mild hypophosphatemia Phos 2.2 Renal: SCr stable at baseline Hepatic: Mild, stable transaminitis. Tbili trending up TG: 8/1: 170 GI Imaging: 9/11 CTAP: no new acute issues GI Surgeries / Procedures: s/p laparotomy, excision of greater omental mass, abdominal wall reconstruction with [name], appendectomy, and placement of Prevena vac 12/19/22 placement of right IJ port-a-catheter with dual reservoirs also s/p re-opening of laparotomy for repair of small bowel perforation   Central access: PICC 04/09/2023   TPN start date: 04/09/23    (was on TPN from 02/15/22 to 04/05/23- held for bacteremia and TPN restarted 04/09/23)  Nutritional Goals: Goal cyclic TPN over 16 hrs: cyclic TPN over 16 hrs: (provides 115.5 g of protein and 1614 kcals per day) total volume over 16hrs for calculations=1500 ml  RD Assessment:  Estimated Needs Total Energy Estimated Needs: 1800-2100kcal/day Total Protein Estimated Needs: 90-110g/d Total Fluid Estimated Needs: 1.4-1.6L/day  Current Nutrition:  Soft diet + nutritional supplements, not meeting PO needs  Plan:  Transitioned to *Cyclic* TPN on 1/61/09.   to run over 16 hours per MD request. [name] at 2000 per discussion with dietician to allow patient time for walking in afternoon/evenings.  To run over 16 hours: -Start rate at 53 mL/hr for 1  hour. -Increase rate to 105 mL/hr for 13 hours.  -Decrease rate to 53 mL/hr for 1 hour. -Decrease rate to 26 mL/hr for 1 hour, then stop.  Plan:  8/3: continue cyclic TPN at goal rate  See cyclic TPN rate above Nutritional Components Amino acids (using 15% Clinisol): 115.5 Dextrose 12% = 180 g  Lipids (using 20% SMOFlipids): 52.2 g kCal: 1614/24h  7/25: Adjusted Dextrose from 17% to 12% at physician request and increase Amino acids from 108g to 115.5g per dietary. Total volume increasing from to  to see if helps with blood sugars Electrolytes in TPN: Na 75 mEq/L, K 50 mEq/L, Ca 63mEq/L, Mg 10 mEq/L, Phos 10 mmol/L, Cl:Ac 1:1 Resume MVI, trace elements, chromium, zinc 10 mg, and selenium 150 mcg per dietary on 03/30/23 Continue CBG/SSI for Cyclic TPN:  -CBG 2 hrs after cyclic TPN start -CBG during middle of cyclic TPN -CBG 1 hr after cylic TPN stopped -CBG while off TPN Continue SSI insulin at moderate scale  Phos=2.2   Will order Phos-NaK 2 packets po x 1 dose(Surgeon okay with holding off on recheck until biweekly labs due) Check TPN labs weekly on Mondays and more frequently if indicated  Bettey Costa, PharmD Clinical Pharmacist 04/15/2023 8:57 AM

## 2023-04-15 NOTE — Plan of Care (Signed)
  Problem: Nutrition: Goal: Adequate nutrition will be maintained Outcome: Progressing   Problem: Coping: Goal: Level of anxiety will decrease Outcome: Progressing   Problem: Pain Managment: Goal: General experience of comfort will improve Outcome: Progressing   Problem: Activity: Goal: Risk for activity intolerance will decrease Outcome: Progressing

## 2023-04-15 NOTE — Progress Notes (Signed)
[facility] SURGICAL PROGRESS NOTE   Hospital Day(s): 426.   Post op day(s): 117 Days Post-Op.   Interval History:  Patient seen and examined Nothing acute overnight No new issues this morning No bleeding from [facility] output unmeasured in last 24 hours; managing effluent independently On cyclic TPN + soft diet  Ambulating well; independent  Review of Systems:  Constitutional: denies fever, chills  HEENT: denies cough or congestion  Respiratory: denies any shortness of breath  Cardiovascular: denies chest pain or palpitations  Gastrointestinal: denied abdominal pain, denied N/V Genitourinary: denies burning with urination or urinary frequency Integumentary: + midline wound (healing; stable)  Vital signs in last 24 hours: [min-max] current  Temp:  [97.8 F (36.6 C)-98.3 F (36.8 C)] 98.3 F (36.8 C) (08/07 0430) Pulse Rate:  [65-74] 70 (08/07 0430) Resp:  [16-20] 18 (08/07 0430) BP: (104-119)/(60-66) 116/60 (08/07 0430) SpO2:  [97 %-100 %] 97 % (08/07 0430) Weight:  [107.6 kg] 107.6 kg (08/07 0500)     Height: 4\' 11"  (149.9 cm) Weight: 107.6 kg BMI (Calculated): 46.02   Intake/Output last 2 shifts:  08/06 0701 - 08/07 0700 In: 120 [P.O.:120] Out: -    Physical Exam:  Constitutional: alert, cooperative and no distress  Respiratory: breathing non-labored at rest  Cardiovascular: regular rate and sinus rhythm  Gastrointestinal: Soft, non-tender, non-distended, no rebound/guarding. Integumentary: Midline wound healing by secondary intention, peritoneum closed; granulating and new skin growing over majority of wound now; there are three stomatized areas visible in the LUQ and LLQ portions of the wound. No active bleeding this morning  Chest: Right chest port site healing via secondary intention; no erythema    Labs:     Latest Ref Rng & Units 04/05/2023   10:30 AM 04/04/2023    6:31 AM 03/31/2023    8:34 AM  CBC  WBC 4.0 - 10.5 K/uL 4.8  8.1  3.3    Hemoglobin 12.0 - 15.0 g/dL 7.4  8.2  8.2   Hematocrit 36.0 - 46.0 % 22.9  26.0  26.0   Platelets 150 - 400 K/uL 63  86  72       Latest Ref Rng & Units 04/13/2023    8:01 AM 04/12/2023    5:37 AM 04/11/2023    5:13 AM  CMP  Glucose 70 - 99 mg/dL 409  811  914   BUN 6 - 20 mg/dL 24  20  16    Creatinine 0.44 - 1.00 mg/dL 7.82  9.56  2.13   Sodium 135 - 145 mmol/L 136  136  135   Potassium 3.5 - 5.1 mmol/L 4.5  3.9  4.1   Chloride 98 - 111 mmol/L 108  107  106   CO2 22 - 32 mmol/L 21  25  24    Calcium 8.9 - 10.3 mg/dL 8.2  8.1  8.0   Total Protein 6.5 - 8.1 g/dL 6.2     Total Bilirubin 0.3 - 1.2 mg/dL 0.8     Alkaline Phos 38 - 126 U/L 198     AST 15 - 41 U/L 58     ALT 0 - 44 U/L 41       Imaging studies: No new imaging studies this morning    Assessment/Plan:  57 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  New 08/07: - No acute changes this AM - Completed Abx - Surgery 08/20  - Wound Care ([name] Pouch); [name] pouch; continues managing effluent independently. Intermittent oozing from time to time. Change as needed.   - Continue soft diet as tolerated  - Continue cyclic TPN; weekly nutritional labs - Appreciate dietary assistance - monitoring weight             - Monitor abdominal examination; on-going bowel function            - Pain control prn; antemetic prn            - Mobilizing without issue   - Discharge Planning; Surgery delayed secondary to bacteremia; timing pending    All of the above findings and recommendations were discussed with the patient, and the medical team, and all of patient's questions were answered to her expressed  satisfaction.  -- [name], PA-C [facility] 04/15/2023, 7:40 AM M-F: 7am - 4pm

## 2023-04-16 LAB — GLUCOSE, CAPILLARY
Glucose-Capillary: 187 mg/dL — ABNORMAL HIGH (ref 70–99)
Glucose-Capillary: 134 mg/dL — ABNORMAL HIGH (ref 70–99)
Glucose-Capillary: 108 mg/dL — ABNORMAL HIGH (ref 70–99)
Glucose-Capillary: 94 mg/dL (ref 70–99)

## 2023-04-16 MED ORDER — ZINC CHLORIDE 1 MG/ML IV SOLN
INTRAVENOUS | Status: AC
  Filled 2023-04-16: qty 770

## 2023-04-16 NOTE — Plan of Care (Signed)
  Problem: Activity: Goal: Risk for activity intolerance will decrease Outcome: Progressing   Problem: Nutrition: Goal: Adequate nutrition will be maintained Outcome: Progressing   Problem: Pain Managment: Goal: General experience of comfort will improve Outcome: Progressing   

## 2023-04-16 NOTE — Progress Notes (Signed)
[facility] SURGICAL PROGRESS NOTE   Hospital Day(s): 427.   Post op day(s): 118 Days Post-Op.   Interval History:  Patient seen and examined Nothing acute overnight No new issues this morning No bleeding from [facility] output unmeasured in last 24 hours; managing effluent independently On cyclic TPN + soft diet  Ambulating well; independent  Review of Systems:  Constitutional: denies fever, chills  HEENT: denies cough or congestion  Respiratory: denies any shortness of breath  Cardiovascular: denies chest pain or palpitations  Gastrointestinal: denied abdominal pain, denied N/V Genitourinary: denies burning with urination or urinary frequency Integumentary: + midline wound (healing; stable)  Vital signs in last 24 hours: [min-max] current  Temp:  [98 F (36.7 C)-98.1 F (36.7 C)] 98 F (36.7 C) (08/08 0600) Pulse Rate:  [65-74] 65 (08/08 0600) Resp:  [18] 18 (08/07 2106) BP: (105-114)/(54-66) 105/66 (08/08 0600) SpO2:  [96 %-97 %] 96 % (08/08 0600) Weight:  [108.9 kg] 108.9 kg (08/08 0500)     Height: 4\' 11"  (149.9 cm) Weight: 108.9 kg BMI (Calculated): 46.02   Intake/Output last 2 shifts:  No intake/output data recorded.   Physical Exam:  Constitutional: alert, cooperative and no distress  Respiratory: breathing non-labored at rest  Cardiovascular: regular rate and sinus rhythm  Gastrointestinal: Soft, non-tender, non-distended, no rebound/guarding. Integumentary: Midline wound healing by secondary intention, peritoneum closed; granulating and new skin growing over majority of wound now; there are three stomatized areas visible in the LUQ and LLQ portions of the wound. No active bleeding this morning  Chest: Right chest port site healing via secondary intention; no erythema    Labs:     Latest Ref Rng & Units 04/05/2023   10:30 AM 04/04/2023    6:31 AM 03/31/2023    8:34 AM  CBC  WBC 4.0 - 10.5 K/uL 4.8  8.1  3.3   Hemoglobin 12.0 - 15.0 g/dL  7.4  8.2  8.2   Hematocrit 36.0 - 46.0 % 22.9  26.0  26.0   Platelets 150 - 400 K/uL 63  86  72       Latest Ref Rng & Units 04/13/2023    8:01 AM 04/12/2023    5:37 AM 04/11/2023    5:13 AM  CMP  Glucose 70 - 99 mg/dL 161  096  045   BUN 6 - 20 mg/dL 24  20  16    Creatinine 0.44 - 1.00 mg/dL 4.09  8.11  9.14   Sodium 135 - 145 mmol/L 136  136  135   Potassium 3.5 - 5.1 mmol/L 4.5  3.9  4.1   Chloride 98 - 111 mmol/L 108  107  106   CO2 22 - 32 mmol/L 21  25  24    Calcium 8.9 - 10.3 mg/dL 8.2  8.1  8.0   Total Protein 6.5 - 8.1 g/dL 6.2     Total Bilirubin 0.3 - 1.2 mg/dL 0.8     Alkaline Phos 38 - 126 U/L 198     AST 15 - 41 U/L 58     ALT 0 - 44 U/L 41       Imaging studies: No new imaging studies this morning    Assessment/Plan:  57 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  New 08/08: - No acute changes this AM - Continue local wound care for port site daily - Surgery 08/20  - Wound Care ([name] Pouch); [name] pouch; continues managing effluent independently. Intermittent oozing from time to time. Change as needed.   - Continue soft diet as tolerated  - Continue cyclic TPN; weekly nutritional labs - Appreciate dietary assistance - monitoring weight             - Monitor abdominal examination; on-going bowel function            - Pain control prn; antemetic prn            - Mobilizing without issue   - Discharge Planning; Surgery delayed secondary to bacteremia; timing pending    All of the above findings and recommendations were discussed with the patient, and the medical team, and all of patient's questions were answered to her expressed  satisfaction.  -- [name], PA-C [facility] 04/16/2023, 7:42 AM M-F: 7am - 4pm

## 2023-04-16 NOTE — TOC Progression Note (Signed)
Transition of Care [facility]) - Progression Note    Patient Details  Name: Debra [name] MRN: [id number] Date of Birth: 1966/05/21  [facility] of [facility]) CM/SW Contact  Darleene Cleaver, [place] Phone Number: 04/16/2023, 6:04 PM  Clinical Narrative:     TOC continuing to follow patent's progress throughout discharge planning.   Expected Discharge Plan: Home w Home Health Services Barriers to Discharge: Continued Medical Work up  Expected Discharge Plan and Services                                               Social Determinants of Health (SDOH) Interventions SDOH Screenings   Food Insecurity: No Food Insecurity (05/20/2022)  Housing: Low Risk  (05/20/2022)  Transportation Needs: No Transportation Needs (05/20/2022)  Utilities: Not At Risk (05/20/2022)  Tobacco Use: Low Risk  (05/19/2022)    Readmission Risk Interventions     No data to display

## 2023-04-16 NOTE — Progress Notes (Signed)
Nutrition Follow-up  DOCUMENTATION CODES:   Obesity unspecified  INTERVENTION:   TPN per pharmacy- continue cyclic (16 hrs)- Recommend starting at 1/2 rate as pt is likely at refeed risk.    Ensure Enlive po BID, each supplement provides 350 kcal and 20 grams of protein.  Continue MVI, chromium and trace elements in TPN   Daily weights   Continue zinc additional 10mg  daily added to TPN (increased 6/21)  Continue Selenium additional daily added to TPN (increased 6/21)  Continue Vitamin A 30,000 units po daily (increased 7/25)   Ergocalciferol 50,000 units po weekly x 6 weeks (initiated 7/24)  Will check routine vitamin labs every 3 months, next due 10/21  NUTRITION DIAGNOSIS:   Increased nutrient needs related to wound healing, catabolic illness as evidenced by estimated needs. -ongoing   GOAL:   Patient will meet greater than or equal to 90% of their needs -not met   MONITOR:   PO intake, Supplement acceptance, Labs, Weight trends, Diet advancement, I & O's, TPN  ASSESSMENT:   57 y/o female with h/o hypothyroidism, COVID 19 (3/21), kidney stones and stage 3 colon cancer (s/p left hemicolectomy 5/21 and chemotherapy) who is admitted for new pelvic mass now s/p laparotomy 6/8 (with excision of pelvic mass from greater omentum, abdominal wall reconstruction with bilateral myocutaneous flaps and mesh, incisional hernia repair, appendectomy repair and VAC placement) complicated by bowel perforation s/p reopening of recent laparotomy 6/10 (with repair of small bowel perforation, excision of mesh, placement of two phasix mesh and VAC placement). Pathology returned as metastatic adenocarcinoma. Pt with L1 compression fracture.   Pt s/p PICC line 8/1  Pt continues to tolerate cycled TPN. Pt is actively refeeding; no new labs since 8/5. Triglycerides slightly elevated. Plan is to recheck labs tomorrow. Pt continues to have mild hyperglycemia while on TPN. Per chart, pt  remains up >25lbs from her UBW; MD aware and pt is receiving lasix. Plan is for possible surgery 8/20.   Medications reviewed and include: lasix, insulin, synthroid, protonix, vitamin A, vitamin D, TPN  -Selenium- 98 wnl, zinc- 67 wnl, iodine- 49.8 wnl, chromium- 2.3(H), copper- 93 wnl, manganese- 19.0 wnl- 7/22 -vitamin D- 39.67 wnl, vitamin A 12.6(L), -Vitamin B1- 70.3 wnl, B6- 16.4 wnl, vitamin C- 0.4 wnl, vitamin E- 11.8 wnl, vitamin K 1.43 wnl- 7/22 -Ferritin 21, folate 14.8 wnl, B12- 870 wnl- 7/22 -carnitine 32.0 wnl- 4/12 -aluminum 10(H)- 4/12 --Iron 25(L), TIBC 398- 4/12  Labs reviewed: Na 136 wnl, K 4.5 wnl, P 2.2(L), Mg 2.3 wnl- 8/5 Triglycerides- 176- wnl- 8/5 Hgb 7.4(L), Hct 22.9(L)- 7/28 Cbgs-  134, 187 x 24 hrs  Diet Order:    Diet Order             DIET SOFT Fluid consistency: Thin  Diet effective now                  EDUCATION NEEDS:   Not appropriate for education at this time  Skin:  Skin Assessment: Reviewed RN Assessment (EC fistula)  Last BM:  8/6  Height:   Ht Readings from Last 1 Encounters:  12/19/22 4\' 11"  (1.499 m)    Weight:   Wt Readings from Last 1 Encounters:  04/16/23 108.9 kg    Ideal Body Weight:  44.3 kg  BMI:  Body mass index is 48.49 kg/m.  Estimated Nutritional Needs:   Kcal:  1800-2100kcal/day  Protein:  90-110g/d  Fluid:  1.4-1.6L/day  Betsey Holiday MS, RD, LDN Please refer to  AMION for RD and/or RD on-call/weekend/after hours pager

## 2023-04-16 NOTE — Progress Notes (Signed)
PHARMACY - TOTAL PARENTERAL NUTRITION CONSULT NOTE   Indication: Prolonged ileus  Patient Measurements: Height: 4\' 11"  (149.9 cm) Weight: 108.9 kg (240 lb 1.3 oz) IBW/kg (Calculated) : 43.2 TPN AdjBW (KG): 57.8 Body mass index is 48.49 kg/m.  Assessment: Debra [name] is a 57 y.o. female s/p laparotomy, excision of greater omental mass, abdominal wall reconstruction with Debra [name] release, appendectomy, and placement of Prevena vac.  Glucose / Insulin:  --Last Hgb A1c 6.8% on 04/02/23 --mSSI 4x/day (0500, 1300, 1800, 2200) --BG last 24h: 114-197 --SSI last 24h: 6 units Electrolytes: Mild hypophosphatemia Phos 2.2 Renal: SCr stable at baseline Hepatic: Mild, stable transaminitis. Tbili trending up TG: 8/1: 170 GI Imaging: 9/11 CTAP: no new acute issues GI Surgeries / Procedures: s/p laparotomy, excision of greater omental mass, abdominal wall reconstruction with Debra [name] release, appendectomy, and placement of Prevena vac 12/19/22 placement of right IJ port-a-catheter with dual reservoirs also s/p re-opening of laparotomy for repair of small bowel perforation   Central access: PICC 04/09/2023   TPN start date: 04/09/23    (was on TPN from 02/15/22 to 04/05/23- held for bacteremia and TPN restarted 04/09/23)  Nutritional Goals: Goal cyclic TPN over 16 hrs: cyclic TPN over 16 hrs: (provides 115.5 g of protein and 1614 kcals per day) total volume over 16hrs for calculations=1500 ml  RD Assessment:  Estimated Needs Total Energy Estimated Needs: 1800-2100kcal/day Total Protein Estimated Needs: 90-110g/d Total Fluid Estimated Needs: 1.4-1.6L/day  Current Nutrition:  Soft diet + nutritional supplements, not meeting PO needs  Plan:  Transitioned to *Cyclic* TPN on 1/61/09.   to run over 16 hours per MD request. [name] at 2000 per discussion with dietician to allow patient time for walking in afternoon/evenings.  To run over 16 hours: -Start rate at 53 mL/hr for 1  hour. -Increase rate to 105 mL/hr for 13 hours.  -Decrease rate to 53 mL/hr for 1 hour. -Decrease rate to 26 mL/hr for 1 hour, then stop.  Plan:  8/3: continue cyclic TPN at goal rate  See cyclic TPN rate above Nutritional Components Amino acids (using 15% Clinisol): 115.5 Dextrose 12% = 180 g  Lipids (using 20% SMOFlipids): 52.2 g kCal: 1614/24h  7/25: Adjusted Dextrose from 17% to 12% at physician request and increase Amino acids from 108g to 115.5g per dietary. Total volume increasing from to  to see if helps with blood sugars Electrolytes in TPN: Na 75 mEq/L, K 50 mEq/L, Ca 8mEq/L, Mg 10 mEq/L, Phos 10 mmol/L, Cl:Ac 1:1 Resume MVI, trace elements, chromium, zinc 10 mg, and selenium 150 mcg per dietary on 03/30/23 Continue CBG/SSI for Cyclic TPN:  -CBG 2 hrs after cyclic TPN start -CBG during middle of cyclic TPN -CBG 1 hr after cylic TPN stopped -CBG while off TPN Continue SSI insulin at moderate scale  Phos=2.2   Will order Phos-NaK 2 packets po x 1 dose(Surgeon okay with holding off on recheck until biweekly labs due)>>will recheck 8/9 Check TPN labs weekly on Mondays and more frequently if indicated  Barrie Folk, PharmD Clinical Pharmacist 04/16/2023 7:18 AM

## 2023-04-16 NOTE — Plan of Care (Signed)
  Problem: Clinical Measurements: Goal: Ability to maintain clinical measurements within normal limits will improve Outcome: Progressing Goal: Will remain free from infection Outcome: Progressing Goal: Diagnostic test results will improve Outcome: Progressing Goal: Respiratory complications will improve Outcome: Progressing Goal: Cardiovascular complication will be avoided Outcome: Progressing   Problem: Clinical Measurements: Goal: Will remain free from infection Outcome: Progressing   Problem: Nutrition: Goal: Adequate nutrition will be maintained Outcome: Progressing   Problem: Activity: Goal: Risk for activity intolerance will decrease Outcome: Progressing   Problem: Nutrition: Goal: Adequate nutrition will be maintained Outcome: Progressing   Problem: Coping: Goal: Level of anxiety will decrease Outcome: Progressing   Problem: Pain Managment: Goal: General experience of comfort will improve Outcome: Progressing   

## 2023-04-17 LAB — GLUCOSE, CAPILLARY
Glucose-Capillary: 190 mg/dL — ABNORMAL HIGH (ref 70–99)
Glucose-Capillary: 180 mg/dL — ABNORMAL HIGH (ref 70–99)
Glucose-Capillary: 119 mg/dL — ABNORMAL HIGH (ref 70–99)

## 2023-04-17 MED ORDER — ZINC CHLORIDE 1 MG/ML IV SOLN
INTRAVENOUS | Status: AC
  Filled 2023-04-17: qty 770

## 2023-04-17 NOTE — Consult Note (Addendum)
WOC Nurse Consult Note: Consult requested related to frequently leaking [name] pouch.  Staff nurses and patient have been managing the midline abd full thickness wound with fistulas for over a year.  It has been leaking recently and the pouches have been changed frequently.  Requested to assess the location and provide troubleshooting options.  Assessed with surgical PA at the bedside. Full thickness wound is red, 11X10X1cm with deep valley at the bottom of the wound.  This is located very close to an abd crease which becomes very pronounced when the patient is sitting up in the chair. There is a red moist rash to the lower wound edges and also the abd skin fold, painful to touch with patchy areas of partial skin loss; appearance is consistent with probable candidiasis and moisture associated skin damage. 2 stomatized fistulas in the center of the wound with mod amt green drainage.  Pt suctions the wound and pouch periodically by opening the pouch window and using a Yankor.  Applied skin prep and power for crusting to protect skin, then applied large [name] pouch [phone]), turned sideways to provide a larger surface area in the skin between the wound and the abd fold; since the Pt states she does not want the pouch to be applied vertically. New pattern left in the room; supplies at the bedside and instructions have been provided for bedside nurses to perform pouch changes PRN if the pouch is leaking. Pt plans for surgical closure on 8/20 in the OR, according to surgical team. Please re-consult if further assistance is needed.  Thank-you,  Cammie Mcgee MSN, RN, CWOCN, [place], CNS [phone]

## 2023-04-17 NOTE — Progress Notes (Signed)
PHARMACY - TOTAL PARENTERAL NUTRITION CONSULT NOTE   Indication: Prolonged ileus  Patient Measurements: Height: 4\' 11"  (149.9 cm) Weight: 108.9 kg (240 lb 1.3 oz) IBW/kg (Calculated) : 43.2 TPN AdjBW (KG): 57.8 Body mass index is 48.49 kg/m.  Assessment: Debra [name] is a 57 y.o. female s/p laparotomy, excision of greater omental mass, abdominal wall reconstruction with [name], appendectomy, and placement of Prevena vac.  Glucose / Insulin:  --Last Hgb A1c 6.8% on 04/02/23 --mSSI 4x/day (0500, 1300, 1800, 2200) --BG last 24h: 94-190 --SSI last 24h: 6 units Electrolytes: WNL Renal: SCr stable at baseline Hepatic: Mild, stable transaminitis. Tbili trending up TG: 8/9: 174 GI Imaging: 9/11 CTAP: no new acute issues GI Surgeries / Procedures: s/p laparotomy, excision of greater omental mass, abdominal wall reconstruction with [name], appendectomy, and placement of Prevena vac 12/19/22 placement of right IJ port-a-catheter with dual reservoirs also s/p re-opening of laparotomy for repair of small bowel perforation   Central access: PICC 04/09/2023   TPN start date: 04/09/23    (was on TPN from 02/15/22 to 04/05/23- held for bacteremia and TPN restarted 04/09/23)  Nutritional Goals: Goal cyclic TPN over 16 hrs: cyclic TPN over 16 hrs: (provides 115.5 g of protein and 1614 kcals per day) total volume over 16hrs for calculations=1500 ml  RD Assessment:  Estimated Needs Total Energy Estimated Needs: 1800-2100kcal/day Total Protein Estimated Needs: 90-110g/d Total Fluid Estimated Needs: 1.4-1.6L/day  Current Nutrition:  Soft diet + nutritional supplements, not meeting PO needs  Plan:  Transitioned to *Cyclic* TPN on 12/16/79.   to run over 16 hours per MD request. [name] at 2000 per discussion with dietician to allow patient time for walking in afternoon/evenings.  To run over 16 hours: -Start rate at 53 mL/hr for 1 hour. -Increase rate to 105 mL/hr for  13 hours.  -Decrease rate to 53 mL/hr for 1 hour. -Decrease rate to 26 mL/hr for 1 hour, then stop.  Plan:  8/3: continue cyclic TPN at goal rate  See cyclic TPN rate above Nutritional Components Amino acids (using 15% Clinisol): 115.5 Dextrose 12% = 180 g  Lipids (using 20% SMOFlipids): 52.2 g kCal: 1614/24h  7/25: Adjusted Dextrose from 17% to 12% at physician request and increase Amino acids from 108g to 115.5g per dietary. Total volume increasing from to  to see if helps with blood sugars Electrolytes in TPN: Na 75 mEq/L, K 50 mEq/L, Ca 16mEq/L, Mg 10 mEq/L, Phos 10 mmol/L, Cl:Ac 1:1 Resume MVI, trace elements, chromium, zinc 10 mg, and selenium 150 mcg per dietary on 03/30/23 Continue CBG/SSI for Cyclic TPN:  -CBG 2 hrs after cyclic TPN start -CBG during middle of cyclic TPN -CBG 1 hr after cylic TPN stopped -CBG while off TPN Continue SSI insulin at moderate scale  Check TPN labs weekly on Mondays and more frequently if indicated  Barrie Folk, PharmD Clinical Pharmacist 04/17/2023 8:52 AM

## 2023-04-17 NOTE — Progress Notes (Addendum)
[facility] SURGICAL PROGRESS NOTE   Hospital Day(s): 428.   Post op day(s): 119 Days Post-Op.   Interval History:  Patient seen and examined Nothing acute overnight Issues with pouch management yesterday - plan for WOC evaluation at 1030 Otherwise no abdominal pain, no trouble breathing No bleeding from [facility] output unmeasured in last 24 hours; managing effluent independently On cyclic TPN + soft diet  Ambulating well; independent  Review of Systems:  Constitutional: denies fever, chills  HEENT: denies cough or congestion  Respiratory: denies any shortness of breath  Cardiovascular: denies chest pain or palpitations  Gastrointestinal: denied abdominal pain, denied N/V Genitourinary: denies burning with urination or urinary frequency Integumentary: + midline wound (healing; stable)  Vital signs in last 24 hours: [min-max] current  Temp:  [98 F (36.7 C)-98.4 F (36.9 C)] 98.4 F (36.9 C) (08/09 0445) Pulse Rate:  [60-69] 69 (08/09 0445) Resp:  [16-20] 18 (08/09 0445) BP: (98-104)/(54-69) 102/54 (08/09 0445) SpO2:  [95 %-100 %] 95 % (08/09 0445)     Height: 4\' 11"  (149.9 cm) Weight: 108.9 kg BMI (Calculated): 46.02   Intake/Output last 2 shifts:  08/08 0701 - 08/09 0700 In: 240 [P.O.:240] Out: 150 [Stool:150]   Physical Exam:  Constitutional: alert, cooperative and no distress  Respiratory: breathing non-labored at rest  Cardiovascular: regular rate and sinus rhythm  Gastrointestinal: Soft, non-tender, non-distended, no rebound/guarding. Integumentary: Midline wound healing by secondary intention, peritoneum closed; granulating and new skin growing over majority of wound now; there are three stomatized areas visible in the LUQ and LLQ portions of the wound. No active bleeding this morning  Chest: Right chest port site healing via secondary intention; no erythema   Midline wound (04/17/2023):    Labs:     Latest Ref Rng & Units 04/05/2023    10:30 AM 04/04/2023    6:31 AM 03/31/2023    8:34 AM  CBC  WBC 4.0 - 10.5 K/uL 4.8  8.1  3.3   Hemoglobin 12.0 - 15.0 g/dL 7.4  8.2  8.2   Hematocrit 36.0 - 46.0 % 22.9  26.0  26.0   Platelets 150 - 400 K/uL 63  86  72       Latest Ref Rng & Units 04/13/2023    8:01 AM 04/12/2023    5:37 AM 04/11/2023    5:13 AM  CMP  Glucose 70 - 99 mg/dL 323  557  322   BUN 6 - 20 mg/dL 24  20  16    Creatinine 0.44 - 1.00 mg/dL 0.25  4.27  0.62   Sodium 135 - 145 mmol/L 136  136  135   Potassium 3.5 - 5.1 mmol/L 4.5  3.9  4.1   Chloride 98 - 111 mmol/L 108  107  106   CO2 22 - 32 mmol/L 21  25  24    Calcium 8.9 - 10.3 mg/dL 8.2  8.1  8.0   Total Protein 6.5 - 8.1 g/dL 6.2     Total Bilirubin 0.3 - 1.2 mg/dL 0.8     Alkaline Phos 38 - 126 U/L 198     AST 15 - 41 U/L 58     ALT 0 - 44 U/L 41       Imaging studies: No new imaging studies this morning    Assessment/Plan:  57 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  New 08/09: - No acute changes this AM - Will be available to aid in [name] assessment with WOC today if needed --> midline wound with candida rash, likely from moisture, appreciate WOC RN assistance with [name] pouch change, may return to suction if needed, hopefully for short time as plan for operative intervention in 11 days - Continue local wound care for port site daily - Surgery 08/20  - Wound Care ([name] Pouch); [name] pouch; continues managing effluent independently. Intermittent oozing from time to time. Change as needed.   - Continue soft diet as tolerated  - Continue cyclic TPN; weekly nutritional labs - Appreciate dietary assistance - monitoring  weight             - Monitor abdominal examination; on-going bowel function            - Pain control prn; antemetic prn            - Mobilizing without issue   All of the above findings and recommendations were discussed with the patient, and the medical team, and all of patient's questions were answered to her expressed satisfaction.  -- [name], PA-C [facility] 04/17/2023, 7:29 AM M-F: 7am - 4pm

## 2023-04-17 NOTE — Progress Notes (Signed)
Labs pending draw. Spoke with Lab. Patient is on their list this morning.   Madie Reno, RN

## 2023-04-18 MED ORDER — ZINC CHLORIDE 1 MG/ML IV SOLN
INTRAVENOUS | Status: AC
  Filled 2023-04-18: qty 770

## 2023-04-18 NOTE — Progress Notes (Signed)
PHARMACY - TOTAL PARENTERAL NUTRITION CONSULT NOTE   Indication: Prolonged ileus  Patient Measurements: Height: 4\' 11"  (149.9 cm) Weight: 109 kg (240 lb 4.8 oz) IBW/kg (Calculated) : 43.2 TPN AdjBW (KG): 57.8 Body mass index is 48.53 kg/m.  Assessment: Debra [name] is a 57 y.o. female s/p laparotomy, excision of greater omental mass, abdominal wall reconstruction with [name], appendectomy, and placement of Prevena vac.  Glucose / Insulin:  --Last Hgb A1c 6.8% on 04/02/23 --mSSI 4x/day (0500, 1300, 1800, 2200) --BG last 24h: 119-180 --SSI last 24h: 6 units Electrolytes: WNL Renal: SCr stable at baseline Hepatic: Mild, stable transaminitis. Tbili trending up TG: 8/9: 174 GI Imaging: 9/11 CTAP: no new acute issues GI Surgeries / Procedures: s/p laparotomy, excision of greater omental mass, abdominal wall reconstruction with [name], appendectomy, and placement of Prevena vac 12/19/22 placement of right IJ port-a-catheter with dual reservoirs also s/p re-opening of laparotomy for repair of small bowel perforation   Central access: PICC 04/09/2023   TPN start date: 04/09/23    (was on TPN from 02/15/22 to 04/05/23- held for bacteremia and TPN restarted 04/09/23)  Nutritional Goals: Goal cyclic TPN over 16 hrs: cyclic TPN over 16 hrs: (provides 115.5 g of protein and 1614 kcals per day) total volume over 16hrs for calculations=1500 ml  RD Assessment:  Estimated Needs Total Energy Estimated Needs: 1800-2100kcal/day Total Protein Estimated Needs: 90-110g/d Total Fluid Estimated Needs: 1.4-1.6L/day  Current Nutrition:  Soft diet + nutritional supplements, not meeting PO needs  Plan:  Transitioned to *Cyclic* TPN on 7/32/20.   to run over 16 hours per MD request. [name] at 2000 per discussion with dietician to allow patient time for walking in afternoon/evenings.  To run over 16 hours: -Start rate at 53 mL/hr for 1 hour. -Increase rate to 105 mL/hr for  13 hours.  -Decrease rate to 53 mL/hr for 1 hour. -Decrease rate to 26 mL/hr for 1 hour, then stop.  Plan:  8/3: continue cyclic TPN at goal rate  See cyclic TPN rate above Nutritional Components Amino acids (using 15% Clinisol): 115.5 Dextrose 12% = 180 g  Lipids (using 20% SMOFlipids): 52.2 g kCal: 1614/24h  7/25: Adjusted Dextrose from 17% to 12% at physician request and increase Amino acids from 108g to 115.5g per dietary. Total volume increasing from to  to see if helps with blood sugars Electrolytes in TPN: Na 75 mEq/L, K 50 mEq/L, Ca 70mEq/L, Mg 10 mEq/L, Phos 10 mmol/L, Cl:Ac 1:1 Resume MVI, trace elements, chromium, zinc 10 mg, and selenium 150 mcg per dietary on 03/30/23 Continue CBG/SSI for Cyclic TPN:  -CBG 2 hrs after cyclic TPN start -CBG during middle of cyclic TPN -CBG 1 hr after cylic TPN stopped -CBG while off TPN Continue SSI insulin at moderate scale  Check TPN labs weekly on Mondays and more frequently if indicated  Bettey Costa, PharmD Clinical Pharmacist 04/18/2023 8:46 AM

## 2023-04-18 NOTE — Progress Notes (Signed)
Patient ID: Debra [name], female   DOB: [birth year], 57 y.o.   MRN: [id number]     SURGICAL PROGRESS NOTE   Hospital Day(s): 429.   Interval History: Patient seen and examined, no acute events or new complaints overnight. Patient reports feeling well.  She denies any new complaint today.  She denies any nausea or vomiting.  Vital signs in last 24 hours: [min-max] current  Temp:  [97.4 F (36.3 C)-98.7 F (37.1 C)] 98.7 F (37.1 C) (08/10 0444) Pulse Rate:  [70-76] 76 (08/10 0444) Resp:  [18-20] 20 (08/10 0444) BP: (104-115)/(51-76) 104/51 (08/10 0444) SpO2:  [96 %-98 %] 96 % (08/10 0444) Weight:  [109 kg] 109 kg (08/10 0444)     Height: 4\' 11"  (149.9 cm) Weight: 109 kg BMI (Calculated): 46.02   Physical Exam:  Constitutional: alert, cooperative and no distress  Respiratory: breathing non-labored at rest  Cardiovascular: regular rate and sinus rhythm  Gastrointestinal: soft, non-tender, and non-distended.  The midline wound is basically healed with visible enterocutaneous fistula  Labs:     Latest Ref Rng & Units 04/05/2023   10:30 AM 04/04/2023    6:31 AM 03/31/2023    8:34 AM  CBC  WBC 4.0 - 10.5 K/uL 4.8  8.1  3.3   Hemoglobin 12.0 - 15.0 g/dL 7.4  8.2  8.2   Hematocrit 36.0 - 46.0 % 22.9  26.0  26.0   Platelets 150 - 400 K/uL 63  86  72       Latest Ref Rng & Units 04/17/2023    9:01 AM 04/13/2023    8:01 AM 04/12/2023    5:37 AM  CMP  Glucose 70 - 99 mg/dL 295  284  132   BUN 6 - 20 mg/dL 25  24  20    Creatinine 0.44 - 1.00 mg/dL 4.40  1.02  7.25   Sodium 135 - 145 mmol/L 139  136  136   Potassium 3.5 - 5.1 mmol/L 4.0  4.5  3.9   Chloride 98 - 111 mmol/L 108  108  107   CO2 22 - 32 mmol/L 25  21  25    Calcium 8.9 - 10.3 mg/dL 8.1  8.2  8.1   Total Protein 6.5 - 8.1 g/dL  6.2    Total Bilirubin 0.3 - 1.2 mg/dL  0.8    Alkaline Phos 38 - 126 U/L  198    AST 15 - 41 U/L  58    ALT 0 - 44 U/L  41      Imaging studies: No new pertinent imaging  studies   Assessment/Plan:  57 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  No fever. -There is no clinical deterioration -No issues with the wound -Continue medical therapy for catheter infection -Encouraged the patient to ambulate -Continue TPN and current diet   Gae Gallop, MD

## 2023-04-18 NOTE — Plan of Care (Signed)
  Problem: Clinical Measurements: Goal: Ability to maintain clinical measurements within normal limits will improve Outcome: Progressing Goal: Will remain free from infection Outcome: Progressing Goal: Diagnostic test results will improve Outcome: Progressing Goal: Respiratory complications will improve Outcome: Progressing Goal: Cardiovascular complication will be avoided Outcome: Progressing   Problem: Clinical Measurements: Goal: Will remain free from infection Outcome: Progressing   Problem: Nutrition: Goal: Adequate nutrition will be maintained Outcome: Progressing   Problem: Activity: Goal: Risk for activity intolerance will decrease Outcome: Progressing   Problem: Nutrition: Goal: Adequate nutrition will be maintained Outcome: Progressing   Problem: Coping: Goal: Level of anxiety will decrease Outcome: Progressing   Problem: Pain Managment: Goal: General experience of comfort will improve Outcome: Progressing   

## 2023-04-19 LAB — GLUCOSE, CAPILLARY
Glucose-Capillary: 109 mg/dL — ABNORMAL HIGH (ref 70–99)
Glucose-Capillary: 87 mg/dL (ref 70–99)

## 2023-04-19 MED ORDER — ZINC CHLORIDE 1 MG/ML IV SOLN
INTRAVENOUS | Status: AC
  Filled 2023-04-19: qty 770

## 2023-04-19 NOTE — Progress Notes (Signed)
PHARMACY - TOTAL PARENTERAL NUTRITION CONSULT NOTE   Indication: Prolonged ileus  Patient Measurements: Height: 4\' 11"  (149.9 cm) Weight: 109.2 kg (240 lb 11.9 oz) IBW/kg (Calculated) : 43.2 TPN AdjBW (KG): 57.8 Body mass index is 48.62 kg/m.  Assessment: Debra [name] is a 57 y.o. female s/p laparotomy, excision of greater omental mass, abdominal wall reconstruction with [name], appendectomy, and placement of Prevena vac.  Glucose / Insulin:  --Last Hgb A1c 6.8% on 04/02/23 --mSSI 4x/day (0500, 1300, 1800, 2200) --BG last 24h: no levels obtained(refusing checks per nurse) --SSI last 24h: 0 units Electrolytes: WNL Renal: SCr stable at baseline Hepatic: Mild, stable transaminitis. Tbili trending up TG: 8/9: 174 GI Imaging: 9/11 CTAP: no new acute issues GI Surgeries / Procedures: s/p laparotomy, excision of greater omental mass, abdominal wall reconstruction with [name], appendectomy, and placement of Prevena vac 12/19/22 placement of right IJ port-a-catheter with dual reservoirs also s/p re-opening of laparotomy for repair of small bowel perforation   Central access: PICC 04/09/2023   TPN start date: 04/09/23    (was on TPN from 02/15/22 to 04/05/23- held for bacteremia and TPN restarted 04/09/23)  Nutritional Goals: Goal cyclic TPN over 16 hrs: cyclic TPN over 16 hrs: (provides 115.5 g of protein and 1614 kcals per day) total volume over 16hrs for calculations=1500 ml  RD Assessment:  Estimated Needs Total Energy Estimated Needs: 1800-2100kcal/day Total Protein Estimated Needs: 90-110g/d Total Fluid Estimated Needs: 1.4-1.6L/day  Current Nutrition:  Soft diet + nutritional supplements, not meeting PO needs  Plan:  Transitioned to *Cyclic* TPN on 7/84/69.   to run over 16 hours per MD request. [name] at 2000 per discussion with dietician to allow patient time for walking in afternoon/evenings.  To run over 16 hours: -Start rate at 53 mL/hr for  1 hour. -Increase rate to 105 mL/hr for 13 hours.  -Decrease rate to 53 mL/hr for 1 hour. -Decrease rate to 26 mL/hr for 1 hour, then stop.  Plan:  8/3: continue cyclic TPN at goal rate  See cyclic TPN rate above Nutritional Components Amino acids (using 15% Clinisol): 115.5 Dextrose 12% = 180 g  Lipids (using 20% SMOFlipids): 52.2 g kCal: 1614/24h  7/25: Adjusted Dextrose from 17% to 12% at physician request and increase Amino acids from 108g to 115.5g per dietary. Total volume increasing from to  to see if helps with blood sugars Electrolytes in TPN: Na 75 mEq/L, K 50 mEq/L, Ca 48mEq/L, Mg 10 mEq/L, Phos 10 mmol/L, Cl:Ac 1:1 Resume MVI, trace elements, chromium, zinc 10 mg, and selenium 150 mcg per dietary on 03/30/23 Continue CBG/SSI for Cyclic TPN:  -CBG 2 hrs after cyclic TPN start -CBG during middle of cyclic TPN -CBG 1 hr after cylic TPN stopped -CBG while off TPN Continue SSI insulin at moderate scale  Check TPN labs weekly on Mondays and more frequently if indicated  Bettey Costa, PharmD Clinical Pharmacist 04/19/2023 10:32 AM

## 2023-04-19 NOTE — Progress Notes (Signed)
Patient ID: Debra [name], female   DOB: 18-Apr-1966, 57 y.o.   MRN: [id number]     SURGICAL PROGRESS NOTE   Hospital Day(s): 430.   Interval History: Patient seen and examined, no acute events or new complaints overnight.  Patient found resting comfortable.  Patient denies any complaint.  Denies any significant pain.  Denies any nausea or vomiting.  Denies any fever.  Vital signs in last 24 hours: [min-max] current  Temp:  [98 F (36.7 C)-98.3 F (36.8 C)] 98.3 F (36.8 C) (08/11 0826) Pulse Rate:  [63-79] 72 (08/11 0826) Resp:  [16] 16 (08/11 0826) BP: (102-120)/(56-66) 103/56 (08/11 0826) SpO2:  [95 %-100 %] 100 % (08/11 0826) Weight:  [109.2 kg] 109.2 kg (08/11 0500)     Height: 4\' 11"  (149.9 cm) Weight: 109.2 kg BMI (Calculated): 46.02   Physical Exam:  Constitutional: alert, cooperative and no distress  Respiratory: breathing non-labored at rest  Cardiovascular: regular rate and sinus rhythm  Gastrointestinal: soft, non-tender, and non-distended.  Ostomies is covered by bag.  Labs:     Latest Ref Rng & Units 04/05/2023   10:30 AM 04/04/2023    6:31 AM 03/31/2023    8:34 AM  CBC  WBC 4.0 - 10.5 K/uL 4.8  8.1  3.3   Hemoglobin 12.0 - 15.0 g/dL 7.4  8.2  8.2   Hematocrit 36.0 - 46.0 % 22.9  26.0  26.0   Platelets 150 - 400 K/uL 63  86  72       Latest Ref Rng & Units 04/17/2023    9:01 AM 04/13/2023    8:01 AM 04/12/2023    5:37 AM  CMP  Glucose 70 - 99 mg/dL 086  578  469   BUN 6 - 20 mg/dL 25  24  20    Creatinine 0.44 - 1.00 mg/dL 6.29  5.28  4.13   Sodium 135 - 145 mmol/L 139  136  136   Potassium 3.5 - 5.1 mmol/L 4.0  4.5  3.9   Chloride 98 - 111 mmol/L 108  108  107   CO2 22 - 32 mmol/L 25  21  25    Calcium 8.9 - 10.3 mg/dL 8.1  8.2  8.1   Total Protein 6.5 - 8.1 g/dL  6.2    Total Bilirubin 0.3 - 1.2 mg/dL  0.8    Alkaline Phos 38 - 126 U/L  198    AST 15 - 41 U/L  58    ALT 0 - 44 U/L  41      Imaging studies: No new pertinent imaging  studies   Assessment/Plan:  57 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  No fever. -There is no clinical deterioration -No issues with the wound -Continue medical therapy for catheter infection -Encouraged the patient to ambulate -Continue TPN and current diet  Gae Gallop, MD

## 2023-04-20 LAB — GLUCOSE, CAPILLARY
Glucose-Capillary: 176 mg/dL — ABNORMAL HIGH (ref 70–99)
Glucose-Capillary: 139 mg/dL — ABNORMAL HIGH (ref 70–99)
Glucose-Capillary: 158 mg/dL — ABNORMAL HIGH (ref 70–99)
Glucose-Capillary: 132 mg/dL — ABNORMAL HIGH (ref 70–99)
Glucose-Capillary: 112 mg/dL — ABNORMAL HIGH (ref 70–99)
Glucose-Capillary: 163 mg/dL — ABNORMAL HIGH (ref 70–99)
Glucose-Capillary: 142 mg/dL — ABNORMAL HIGH (ref 70–99)
Glucose-Capillary: 116 mg/dL — ABNORMAL HIGH (ref 70–99)
Glucose-Capillary: 130 mg/dL — ABNORMAL HIGH (ref 70–99)

## 2023-04-20 MED ORDER — ZINC CHLORIDE 1 MG/ML IV SOLN
INTRAVENOUS | Status: AC
  Filled 2023-04-20: qty 770

## 2023-04-20 MED ORDER — POTASSIUM CHLORIDE 20 MEQ PO PACK
20.0000 meq | PACK | Freq: Once | ORAL | Status: AC
  Administered 2023-04-20: 20 meq via ORAL
  Filled 2023-04-20: qty 1

## 2023-04-20 NOTE — Progress Notes (Signed)
PHARMACY - TOTAL PARENTERAL NUTRITION CONSULT NOTE   Indication: Prolonged ileus  Patient Measurements: Height: 4\' 11"  (149.9 cm) Weight: 108.9 kg (240 lb 1.3 oz) IBW/kg (Calculated) : 43.2 TPN AdjBW (KG): 57.8 Body mass index is 48.49 kg/m.  Assessment: Debra [name] is a 57 y.o. female s/p laparotomy, excision of greater omental mass, abdominal wall reconstruction with [name], appendectomy, and placement of Prevena vac.  Glucose / Insulin:  --Last Hgb A1c 6.8% on 04/02/23 --mSSI 4x/day (0500, 1300, 1800, 2200) --BG last 24h: 87-165 --SSI last 24h: 5 units Electrolytes: K = 3.4, give Kcl 20 mEq po x 1 Renal: SCr stable at baseline Hepatic: AST 51, ALT 34, Tbili 0.8 TG: 8/12: 155 GI Imaging: 9/11 CTAP: no new acute issues GI Surgeries / Procedures: s/p laparotomy, excision of greater omental mass, abdominal wall reconstruction with [name], appendectomy, and placement of Prevena vac 12/19/22 placement of right IJ port-a-catheter with dual reservoirs also s/p re-opening of laparotomy for repair of small bowel perforation   Central access: PICC 04/09/2023   TPN start date: 04/09/23    (was on TPN from 02/15/22 to 04/05/23- held for bacteremia and TPN restarted 04/09/23)  Nutritional Goals: Goal cyclic TPN over 16 hrs: cyclic TPN over 16 hrs: (provides 115.5 g of protein and 1614 kcals per day) total volume over 16hrs for calculations=1500 ml  RD Assessment:  Estimated Needs Total Energy Estimated Needs: 1800-2100kcal/day Total Protein Estimated Needs: 90-110g/d Total Fluid Estimated Needs: 1.4-1.6L/day  Current Nutrition:  Soft diet + nutritional supplements, not meeting PO needs  Plan:  Transitioned to *Cyclic* TPN on 12/16/79.   to run over 16 hours per MD request. Debra [name] at 2000 per discussion with dietician to allow patient time for walking in afternoon/evenings.  To run over 16 hours: -Start rate at 53 mL/hr for 1 hour. -Increase rate to 105  mL/hr for 13 hours.  -Decrease rate to 53 mL/hr for 1 hour. -Decrease rate to 26 mL/hr for 1 hour, then stop.  Plan:  8/3: continue cyclic TPN at goal rate  See cyclic TPN rate above Nutritional Components Amino acids (using 15% Clinisol): 115.5 Dextrose 12% = 180 g  Lipids (using 20% SMOFlipids): 52.2 g kCal: 1614/24h  7/25: Adjusted Dextrose from 17% to 12% at physician request and increase Amino acids from 108g to 115.5g per dietary. Total volume increasing from to  to see if helps with blood sugars Electrolytes in TPN: Na 75 mEq/L, K 50 mEq/L, Ca 82mEq/L, Mg 10 mEq/L, Phos 10 mmol/L, Cl:Ac 1:1 Potassium 3.4, give Kcl 20 mEq po x 1, recheck potassium 8/13 Resume MVI, trace elements, chromium, zinc 10 mg, and selenium 150 mcg per dietary on 03/30/23 Continue CBG/SSI for Cyclic TPN:  -CBG 2 hrs after cyclic TPN start -CBG during middle of cyclic TPN -CBG 1 hr after cylic TPN stopped -CBG while off TPN Continue SSI insulin at moderate scale  Check TPN labs weekly on Mondays and more frequently if indicated  Barrie Folk, PharmD Clinical Pharmacist 04/20/2023 8:53 AM

## 2023-04-20 NOTE — Plan of Care (Signed)
  Problem: Clinical Measurements: Goal: Ability to maintain clinical measurements within normal limits will improve Outcome: Progressing Goal: Will remain free from infection Outcome: Progressing Goal: Diagnostic test results will improve Outcome: Progressing Goal: Respiratory complications will improve Outcome: Progressing Goal: Cardiovascular complication will be avoided Outcome: Progressing   Problem: Clinical Measurements: Goal: Will remain free from infection Outcome: Progressing   Problem: Nutrition: Goal: Adequate nutrition will be maintained Outcome: Progressing   Problem: Activity: Goal: Risk for activity intolerance will decrease Outcome: Progressing   Problem: Nutrition: Goal: Adequate nutrition will be maintained Outcome: Progressing   Problem: Coping: Goal: Level of anxiety will decrease Outcome: Progressing   Problem: Pain Managment: Goal: General experience of comfort will improve Outcome: Progressing   

## 2023-04-20 NOTE — Progress Notes (Signed)
[facility] SUR[facility]ICAL PROGRESS NOTE   Hospital Day(s): 431.   Post op day(s): 122 Days Post-Op.   Interval History:  Patient seen and examined Nothing acute over the weekend Doing well this morning; no issues  No bleeding from [facility] output unmeasured in last 24 hours; managing effluent independently On cyclic TPN + soft diet  Ambulating well; independent  Review of Systems:  Constitutional: denies fever, chills  HEENT: denies cough or congestion  Respiratory: denies any shortness of breath  Cardiovascular: denies chest pain or palpitations  Gastrointestinal: denied abdominal pain, denied N/V Genitourinary: denies burning with urination or urinary frequency Integumentary: + midline wound (healing; stable)  Vital signs in last 24 hours: [min-max] current  Temp:  [97.7 F (36.5 C)-98.5 F (36.9 C)] 98.5 F (36.9 C) (08/12 0420) Pulse Rate:  [65-76] 73 (08/12 0420) Resp:  [16-18] 18 (08/12 0420) BP: (93-119)/(48-62) 93/53 (08/12 0420) SpO2:  [93 %-100 %] 93 % (08/12 0420) Weight:  [108.9 kg] 108.9 kg (08/12 0322)     Height: 4\' 11"  (149.9 cm) Weight: 108.9 kg BMI (Calculated): 46.02   Intake/Output last 2 shifts:  08/11 0701 - 08/12 0700 In: 274.2 [I.V.:274.2] Out: -    Physical Exam:  Constitutional: alert, cooperative and no distress  Respiratory: breathing non-labored at rest  Cardiovascular: regular rate and sinus rhythm  Gastrointestinal: Soft, non-tender, non-distended, no rebound/guarding. Integumentary: Midline wound healing by secondary intention, peritoneum closed; granulating and new skin growing over majority of wound now; there are three stomatized areas visible in the LUQ and LLQ portions of the wound. No active bleeding this morning  Chest: Right chest port site healing via secondary intention; no erythema    Labs:     Latest Ref Rng & Units 04/20/2023    4:14 AM 04/05/2023   10:30 AM 04/04/2023    6:31 AM  CBC  WBC 4.0 - 10.5  K/uL 2.6  4.8  8.1   Hemoglobin 12.0 - 15.0 g/dL 8.3  7.4  8.2   Hematocrit 36.0 - 46.0 % 26.5  22.9  26.0   Platelets 150 - 400 K/uL 59  63  86       Latest Ref Rng & Units 04/20/2023    4:14 AM 04/17/2023    9:01 AM 04/13/2023    8:01 AM  CMP  Glucose 70 - 99 mg/dL 102  725  366   BUN 6 - 20 mg/dL 22  25  24    Creatinine 0.44 - 1.00 mg/dL 4.40  3.47  4.25   Sodium 135 - 145 mmol/L 135  139  136   Potassium 3.5 - 5.1 mmol/L 3.4  4.0  4.5   Chloride 98 - 111 mmol/L 102  108  108   CO2 22 - 32 mmol/L 26  25  21    Calcium 8.9 - 10.3 mg/dL 7.9  8.1  8.2   Total Protein 6.5 - 8.1 g/dL 5.9   6.2   Total Bilirubin 0.3 - 1.2 mg/dL 0.8   0.8   Alkaline Phos 38 - 126 U/L 171   198   AST 15 - 41 U/L 51   58   ALT 0 - 44 U/L 34   41     Imaging studies: No new imaging studies this morning    Assessment/Plan:  57 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  New 08/12: - No acute changes this AM - Okay to DC contact precautions - Continue local wound care for port site daily - Surgery 08/20  - Wound Care ([name] Pouch); [name] pouch; continues managing effluent independently. Intermittent oozing from time to time. Change as needed.   - Continue soft diet as tolerated  - Continue cyclic TPN; weekly nutritional labs - Appreciate dietary assistance - monitoring weight             - Monitor abdominal examination; on-going bowel function            - Pain control prn; antemetic prn            - Mobilizing without issue   All of the above findings and recommendations were discussed with the patient, and the medical team, and all of patient's questions were answered to  her expressed satisfaction.  -- [name], PA-C [facility] 04/20/2023, 7:42 AM M-F: 7am - 4pm

## 2023-04-21 LAB — GLUCOSE, CAPILLARY
Glucose-Capillary: 160 mg/dL — ABNORMAL HIGH (ref 70–99)
Glucose-Capillary: 136 mg/dL — ABNORMAL HIGH (ref 70–99)
Glucose-Capillary: 108 mg/dL — ABNORMAL HIGH (ref 70–99)
Glucose-Capillary: 110 mg/dL — ABNORMAL HIGH (ref 70–99)

## 2023-04-21 MED ORDER — ZINC CHLORIDE 1 MG/ML IV SOLN
INTRAVENOUS | Status: AC
  Filled 2023-04-21: qty 770

## 2023-04-21 NOTE — Progress Notes (Signed)
PHARMACY - TOTAL PARENTERAL NUTRITION CONSULT NOTE   Indication: Prolonged ileus  Patient Measurements: Height: 4\' 11"  (149.9 cm) Weight: 108.9 kg (240 lb 1.3 oz) IBW/kg (Calculated) : 43.2 TPN AdjBW (KG): 57.8 Body mass index is 48.49 kg/m.  Assessment: Debra [name] is a 57 y.o. female s/p laparotomy, excision of greater omental mass, abdominal wall reconstruction with [name], appendectomy, and placement of Prevena vac.  Glucose / Insulin:  --Last Hgb A1c 6.8% on 04/02/23 --mSSI 4x/day (0500, 1300, 1800, 2200) --BG last 24h: 116-163 --SSI last 24h: 7 units Electrolytes: wnl Renal: SCr stable at baseline Hepatic: AST 51, ALT 34, Tbili 0.8 TG: 8/12: 155 GI Imaging: 9/11 CTAP: no new acute issues GI Surgeries / Procedures: s/p laparotomy, excision of greater omental mass, abdominal wall reconstruction with [name], appendectomy, and placement of Prevena vac 12/19/22 placement of right IJ port-a-catheter with dual reservoirs also s/p re-opening of laparotomy for repair of small bowel perforation   Central access: PICC 04/09/2023   TPN start date: 04/09/23    (was on TPN from 02/15/22 to 04/05/23- held for bacteremia and TPN restarted 04/09/23)  Nutritional Goals: Goal cyclic TPN over 16 hrs: cyclic TPN over 16 hrs: (provides 115.5 g of protein and 1614 kcals per day) total volume over 16hrs for calculations=1500 ml  RD Assessment:  Estimated Needs Total Energy Estimated Needs: 1800-2100kcal/day Total Protein Estimated Needs: 90-110g/d Total Fluid Estimated Needs: 1.4-1.6L/day  Current Nutrition:  Soft diet + nutritional supplements, not meeting PO needs  Plan:  Transitioned to *Cyclic* TPN on 12/16/79.   to run over 16 hours per MD request. [name] at 2000 per discussion with dietician to allow patient time for walking in afternoon/evenings.  To run over 16 hours: -Start rate at 53 mL/hr for 1 hour. -Increase rate to 105 mL/hr for 13 hours.   -Decrease rate to 53 mL/hr for 1 hour. -Decrease rate to 26 mL/hr for 1 hour, then stop.  Plan:  8/3: continue cyclic TPN at goal rate  See cyclic TPN rate above Nutritional Components Amino acids (using 15% Clinisol): 115.5 Dextrose 12% = 180 g  Lipids (using 20% SMOFlipids): 52.2 g kCal: 1614/24h  7/25: Adjusted Dextrose from 17% to 12% at physician request and increase Amino acids from 108g to 115.5g per dietary. Total volume increasing from to  to see if helps with blood sugars Electrolytes in TPN: Na 75 mEq/L, K 50 mEq/L, Ca 60mEq/L, Mg 10 mEq/L, Phos 10 mmol/L, Cl:Ac 1:1 Resume MVI, trace elements, chromium, zinc 10 mg, and selenium 150 mcg per dietary on 03/30/23 Continue CBG/SSI for Cyclic TPN:  -CBG 2 hrs after cyclic TPN start -CBG during middle of cyclic TPN -CBG 1 hr after cylic TPN stopped -CBG while off TPN Continue SSI insulin at moderate scale  Check TPN labs weekly on Mondays and more frequently if indicated  Barrie Folk, PharmD Clinical Pharmacist 04/21/2023 8:56 AM

## 2023-04-21 NOTE — Progress Notes (Signed)
[facility] SUR[facility] PROGRESS NOTE   Hospital Day(s): 432.   Post op day(s): 123 Days Post-Op.   Interval History:  Patient seen and examined Nothing acute overnight No new changes this AM Labs reviewed from 08/12  - Hgb 8.3; improved  - Mild leukopenia to 2.6K  - Mild hypokalemia to 3.4 No bleeding from [facility] output unmeasured in last 24 hours; managing effluent independently On cyclic TPN + soft diet  Ambulating well; independent  Review of Systems:  Constitutional: denies fever, chills  HEENT: denies cough or congestion  Respiratory: denies any shortness of breath  Cardiovascular: denies chest pain or palpitations  Gastrointestinal: denied abdominal pain, denied N/V Genitourinary: denies burning with urination or urinary frequency Integumentary: + midline wound (healing; stable)  Vital signs in last 24 hours: [min-max] current  Temp:  [98 F (36.7 C)-98.3 F (36.8 C)] 98.2 F (36.8 C) (08/13 0537) Pulse Rate:  [64-80] 66 (08/13 0537) Resp:  [16-18] 18 (08/13 0537) BP: (92-114)/(52-76) 92/52 (08/13 0537) SpO2:  [95 %-100 %] 95 % (08/13 0537) Weight:  [108.9 kg] 108.9 kg (08/13 0500)     Height: 4\' 11"  (149.9 cm) Weight: 108.9 kg BMI (Calculated): 46.02   Intake/Output last 2 shifts:  08/12 0701 - 08/13 0700 In: 240 [P.O.:240] Out: -    Physical Exam:  Constitutional: alert, cooperative and no distress  Respiratory: breathing non-labored at rest  Cardiovascular: regular rate and sinus rhythm  Gastrointestinal: Soft, non-tender, non-distended, no rebound/guarding. Integumentary: Midline wound healing by secondary intention, peritoneum closed; granulating and new skin growing over majority of wound now; there are three stomatized areas visible in the LUQ and LLQ portions of the wound. No active bleeding this morning  Chest: Right chest port site healing via secondary intention; no erythema    Labs:     Latest Ref Rng & Units 04/20/2023     4:14 AM 04/05/2023   10:30 AM 04/04/2023    6:31 AM  CBC  WBC 4.0 - 10.5 K/uL 2.6  4.8  8.1   Hemoglobin 12.0 - 15.0 g/dL 8.3  7.4  8.2   Hematocrit 36.0 - 46.0 % 26.5  22.9  26.0   Platelets 150 - 400 K/uL 59  63  86       Latest Ref Rng & Units 04/20/2023    4:14 AM 04/17/2023    9:01 AM 04/13/2023    8:01 AM  CMP  Glucose 70 - 99 mg/dL 811  914  782   BUN 6 - 20 mg/dL 22  25  24    Creatinine 0.44 - 1.00 mg/dL 9.56  2.13  0.86   Sodium 135 - 145 mmol/L 135  139  136   Potassium 3.5 - 5.1 mmol/L 3.4  4.0  4.5   Chloride 98 - 111 mmol/L 102  108  108   CO2 22 - 32 mmol/L 26  25  21    Calcium 8.9 - 10.3 mg/dL 7.9  8.1  8.2   Total Protein 6.5 - 8.1 g/dL 5.9   6.2   Total Bilirubin 0.3 - 1.2 mg/dL 0.8   0.8   Alkaline Phos 38 - 126 U/L 171   198   AST 15 - 41 U/L 51   58   ALT 0 - 44 U/L 34   41     Imaging studies: No new imaging studies this morning    Assessment/Plan:  57 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  New 08/13: - No acute changes this AM - K+ repleted yesterday; normalized this AM - Continue local wound care for port site daily - Surgery 08/20  - Wound Care ([name] Pouch); [name] pouch; continues managing effluent independently. Intermittent oozing from time to time. Change as needed.   - Continue soft diet as tolerated  - Continue cyclic TPN; weekly nutritional labs - Appreciate dietary assistance - monitoring weight             - Monitor abdominal examination; on-going bowel function            - Pain control prn; antemetic prn            - Mobilizing without issue   All of the above findings and recommendations were discussed  with the patient, and the medical team, and all of patient's questions were answered to her expressed satisfaction.  -- [name], PA-C [facility] 04/21/2023, 7:23 AM M-F: 7am - 4pm

## 2023-04-21 NOTE — Plan of Care (Signed)
  Problem: Clinical Measurements: Goal: Ability to maintain clinical measurements within normal limits will improve Outcome: Progressing Goal: Will remain free from infection Outcome: Progressing Goal: Diagnostic test results will improve Outcome: Progressing Goal: Respiratory complications will improve Outcome: Progressing Goal: Cardiovascular complication will be avoided Outcome: Progressing   Problem: Clinical Measurements: Goal: Will remain free from infection Outcome: Progressing   Problem: Nutrition: Goal: Adequate nutrition will be maintained Outcome: Progressing   Problem: Activity: Goal: Risk for activity intolerance will decrease Outcome: Progressing   Problem: Nutrition: Goal: Adequate nutrition will be maintained Outcome: Progressing   Problem: Coping: Goal: Level of anxiety will decrease Outcome: Progressing   Problem: Pain Managment: Goal: General experience of comfort will improve Outcome: Progressing   

## 2023-04-21 NOTE — TOC Progression Note (Signed)
Transition of Care [facility]) - Progression Note    Patient Details  Name: Debra [name] MRN: [id number] Date of Birth: 1965-12-27  Transition of [facility]) CM/SW Contact  Darleene Cleaver, [place] Phone Number: 04/21/2023, 11:14 AM  Clinical Narrative:     Surgery has been rescheduled for 04/28/2023.  TOC continuing to follow patient's progress throughout discharge planning.  Expected Discharge Plan: Home w Home Health Services Barriers to Discharge: Continued Medical Work up  Expected Discharge Plan and Services                                               Social Determinants of Health (SDOH) Interventions SDOH Screenings   Food Insecurity: No Food Insecurity (05/20/2022)  Housing: Low Risk  (05/20/2022)  Transportation Needs: No Transportation Needs (05/20/2022)  Utilities: Not At Risk (05/20/2022)  Tobacco Use: Low Risk  (05/19/2022)    Readmission Risk Interventions     No data to display

## 2023-04-22 LAB — GLUCOSE, CAPILLARY
Glucose-Capillary: 177 mg/dL — ABNORMAL HIGH (ref 70–99)
Glucose-Capillary: 122 mg/dL — ABNORMAL HIGH (ref 70–99)
Glucose-Capillary: 107 mg/dL — ABNORMAL HIGH (ref 70–99)
Glucose-Capillary: 104 mg/dL — ABNORMAL HIGH (ref 70–99)
Glucose-Capillary: 164 mg/dL — ABNORMAL HIGH (ref 70–99)

## 2023-04-22 MED ORDER — ZINC CHLORIDE 1 MG/ML IV SOLN
INTRAVENOUS | Status: AC
  Filled 2023-04-22: qty 770

## 2023-04-22 NOTE — Progress Notes (Signed)
PHARMACY - TOTAL PARENTERAL NUTRITION CONSULT NOTE   Indication: Prolonged ileus  Patient Measurements: Height: 4\' 11"  (149.9 cm) Weight: 108.9 kg (240 lb 1.3 oz) IBW/kg (Calculated) : 43.2 TPN AdjBW (KG): 57.8 Body mass index is 48.49 kg/m.  Assessment: Debra [name] is a 57 y.o. female s/p laparotomy, excision of greater omental mass, abdominal wall reconstruction with [name], appendectomy, and placement of Prevena vac.  Glucose / Insulin:  --Last Hgb A1c 6.8% on 04/02/23 --mSSI 4x/day (0500, 1300, 1800, 2200) --BG last 24h: 116-163 --SSI last 24h: 7 units Electrolytes: wnl Renal: SCr stable at baseline Hepatic: AST 51, ALT 34, Tbili 0.8 TG: 8/12: 155 GI Imaging: 9/11 CTAP: no new acute issues GI Surgeries / Procedures: s/p laparotomy, excision of greater omental mass, abdominal wall reconstruction with [name], appendectomy, and placement of Prevena vac 12/19/22 placement of right IJ port-a-catheter with dual reservoirs also s/p re-opening of laparotomy for repair of small bowel perforation   Central access: PICC 04/09/2023   TPN start date: 04/09/23    (was on TPN from 02/15/22 to 04/05/23- held for bacteremia and TPN restarted 04/09/23)  Nutritional Goals: Goal cyclic TPN over 16 hrs: cyclic TPN over 16 hrs: (provides 115.5 g of protein and 1614 kcals per day) total volume over 16hrs for calculations=1500 ml  RD Assessment:  Estimated Needs Total Energy Estimated Needs: 1800-2100kcal/day Total Protein Estimated Needs: 90-110g/d Total Fluid Estimated Needs: 1.4-1.6L/day  Current Nutrition:  Soft diet + nutritional supplements, not meeting PO needs  Plan:  Transitioned to *Cyclic* TPN on 11/28/53.   to run over 16 hours per MD request. [name] at 2000 per discussion with dietician to allow patient time for walking in afternoon/evenings.  To run over 16 hours: -Start rate at 53 mL/hr for 1 hour. -Increase rate to 105 mL/hr for 13 hours.   -Decrease rate to 53 mL/hr for 1 hour. -Decrease rate to 26 mL/hr for 1 hour, then stop.  Plan:  8/3: continue cyclic TPN at goal rate  See cyclic TPN rate above Nutritional Components Amino acids (using 15% Clinisol): 115.5 Dextrose 12% = 180 g  Lipids (using 20% SMOFlipids): 52.2 g kCal: 1614/24h  7/25: Adjusted Dextrose from 17% to 12% at physician request and increase Amino acids from 108g to 115.5g per dietary. Total volume increasing from to  to see if helps with blood sugars Electrolytes in TPN: Na 75 mEq/L, K 50 mEq/L, Ca 37mEq/L, Mg 10 mEq/L, Phos 10 mmol/L, Cl:Ac 1:1 Resume MVI, trace elements, chromium, zinc 10 mg, and selenium 150 mcg per dietary on 03/30/23 Continue CBG/SSI for Cyclic TPN:  -CBG 2 hrs after cyclic TPN start -CBG during middle of cyclic TPN -CBG 1 hr after cylic TPN stopped -CBG while off TPN Continue SSI insulin at moderate scale  Check TPN labs weekly on Mondays and more frequently if indicated   Elliot Gurney, PharmD, BCPS Clinical Pharmacist  04/22/2023 10:29 AM

## 2023-04-22 NOTE — Plan of Care (Signed)
  Problem: Clinical Measurements: Goal: Ability to maintain clinical measurements within normal limits will improve Outcome: Progressing Goal: Will remain free from infection Outcome: Progressing Goal: Diagnostic test results will improve Outcome: Progressing Goal: Respiratory complications will improve Outcome: Progressing Goal: Cardiovascular complication will be avoided Outcome: Progressing   Problem: Clinical Measurements: Goal: Will remain free from infection Outcome: Progressing   Problem: Nutrition: Goal: Adequate nutrition will be maintained Outcome: Progressing   Problem: Activity: Goal: Risk for activity intolerance will decrease Outcome: Progressing   Problem: Nutrition: Goal: Adequate nutrition will be maintained Outcome: Progressing   Problem: Coping: Goal: Level of anxiety will decrease Outcome: Progressing

## 2023-04-22 NOTE — Progress Notes (Signed)
[facility] PROGRESS NOTE   Hospital Day(s): 433.   Post op day(s): 124 Days Post-Op.   Interval History:  Patient seen and examined Nothing acute overnight No new changes this AM No bleeding from [name] output unmeasured in last 24 hours; managing effluent independently On cyclic TPN + soft diet  Ambulating well; independent  Review of Systems:  Constitutional: denies fever, chills  HEENT: denies cough or congestion  Respiratory: denies any shortness of breath  Cardiovascular: denies chest pain or palpitations  Gastrointestinal: denied abdominal pain, denied N/V Genitourinary: denies burning with urination or urinary frequency Integumentary: + midline wound (healing; stable)  Vital signs in last 24 hours: [min-max] current  Temp:  [98 F (36.7 C)-98.5 F (36.9 C)] 98.2 F (36.8 C) (08/14 0450) Pulse Rate:  [65-78] 78 (08/14 0450) Resp:  [16-19] 19 (08/14 0450) BP: (98-117)/(49-62) 102/54 (08/14 0450) SpO2:  [95 %-100 %] 95 % (08/14 0450)     Height: 4\' 11"  (149.9 cm) Weight: 108.9 kg BMI (Calculated): 46.02   Intake/Output last 2 shifts:  08/13 0701 - 08/14 0700 In: 67.1 [I.V.:67.1] Out: -    Physical Exam:  Constitutional: alert, cooperative and no distress  Respiratory: breathing non-labored at rest  Cardiovascular: regular rate and sinus rhythm  Gastrointestinal: Soft, non-tender, non-distended, no rebound/guarding. Integumentary: Midline wound healing by secondary intention, peritoneum closed; granulating and new skin growing over majority of wound now; there are three stomatized areas visible in the LUQ and LLQ portions of the wound. No active bleeding this morning  Chest: Right chest port site healing via secondary intention; no erythema    Labs:     Latest Ref Rng & Units 04/20/2023    4:14 AM 04/05/2023   10:30 AM 04/04/2023    6:31 AM  CBC  WBC 4.0 - 10.5 K/uL 2.6  4.8  8.1   Hemoglobin 12.0 - 15.0 g/dL 8.3  7.4  8.2    Hematocrit 36.0 - 46.0 % 26.5  22.9  26.0   Platelets 150 - 400 K/uL 59  63  86       Latest Ref Rng & Units 04/21/2023    7:56 AM 04/20/2023    4:14 AM 04/17/2023    9:01 AM  CMP  Glucose 70 - 99 mg/dL 657  846  962   BUN 6 - 20 mg/dL 24  22  25    Creatinine 0.44 - 1.00 mg/dL 9.52  8.41  3.24   Sodium 135 - 145 mmol/L 136  135  139   Potassium 3.5 - 5.1 mmol/L 4.0  3.4  4.0   Chloride 98 - 111 mmol/L 106  102  108   CO2 22 - 32 mmol/L 25  26  25    Calcium 8.9 - 10.3 mg/dL 7.9  7.9  8.1   Total Protein 6.5 - 8.1 g/dL  5.9    Total Bilirubin 0.3 - 1.2 mg/dL  0.8    Alkaline Phos 38 - 126 U/L  171    AST 15 - 41 U/L  51    ALT 0 - 44 U/L  34      Imaging studies: No new imaging studies this morning    Assessment/Plan:  57 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  New 08/14: - No acute changes this AM - Continue local wound care for port site daily - Surgery 08/20  - Wound Care ([name] Pouch); [name] pouch; continues managing effluent independently. Intermittent oozing from time to time. Change as needed.   - Continue soft diet as tolerated  - Continue cyclic TPN; weekly nutritional labs - Appreciate dietary assistance - monitoring weight             - Monitor abdominal examination; on-going bowel function            - Pain control prn; antemetic prn            - Mobilizing without issue   All of the above findings and recommendations were discussed with the patient, and the medical team, and all of patient's questions were answered to her expressed satisfaction.  -- [name], PA-C [facility] 04/22/2023, 7:25 AM M-F: 7am -  4pm

## 2023-04-23 LAB — GLUCOSE, CAPILLARY
Glucose-Capillary: 159 mg/dL — ABNORMAL HIGH (ref 70–99)
Glucose-Capillary: 139 mg/dL — ABNORMAL HIGH (ref 70–99)
Glucose-Capillary: 83 mg/dL (ref 70–99)
Glucose-Capillary: 133 mg/dL — ABNORMAL HIGH (ref 70–99)

## 2023-04-23 MED ORDER — ZINC CHLORIDE 1 MG/ML IV SOLN
INTRAVENOUS | Status: AC
  Filled 2023-04-23: qty 770

## 2023-04-23 NOTE — Plan of Care (Signed)
  Problem: Clinical Measurements: Goal: Ability to maintain clinical measurements within normal limits will improve Outcome: Progressing Goal: Will remain free from infection Outcome: Progressing Goal: Diagnostic test results will improve Outcome: Progressing Goal: Respiratory complications will improve Outcome: Progressing Goal: Cardiovascular complication will be avoided Outcome: Progressing   Problem: Nutrition: Goal: Adequate nutrition will be maintained Outcome: Progressing   Problem: Activity: Goal: Risk for activity intolerance will decrease Outcome: Progressing   Problem: Pain Managment: Goal: General experience of comfort will improve Outcome: Progressing

## 2023-04-23 NOTE — Progress Notes (Signed)
Nutrition Follow-up  DOCUMENTATION CODES:   Obesity unspecified  INTERVENTION:   TPN per pharmacy- continue cyclic (16 hrs)- Recommend starting at 1/2 rate as pt is likely at refeed risk.    Ensure Enlive po BID, each supplement provides 350 kcal and 20 grams of protein.  Continue MVI, chromium and trace elements in TPN   Daily weights   Continue zinc additional 10mg  daily added to TPN (increased 6/21)  Continue Selenium additional daily added to TPN (increased 6/21)  Continue Vitamin A 30,000 units po daily (increased 7/25)   Ergocalciferol 50,000 units po weekly x 6 weeks (initiated 7/24)  Will check routine vitamin labs every 3 months, next due 10/21  NUTRITION DIAGNOSIS:   Increased nutrient needs related to wound healing, catabolic illness as evidenced by estimated needs. -ongoing   GOAL:   Patient will meet greater than or equal to 90% of their needs -not met   MONITOR:   PO intake, Supplement acceptance, Labs, Weight trends, Diet advancement, I & O's, TPN  ASSESSMENT:   57 y/o female with h/o hypothyroidism, COVID 19 (3/21), kidney stones and stage 3 colon cancer (s/p left hemicolectomy 5/21 and chemotherapy) who is admitted for new pelvic mass now s/p laparotomy 6/8 (with excision of pelvic mass from greater omentum, abdominal wall reconstruction with bilateral myocutaneous flaps and mesh, incisional hernia repair, appendectomy repair and VAC placement) complicated by bowel perforation s/p reopening of recent laparotomy 6/10 (with repair of small bowel perforation, excision of mesh, placement of two phasix mesh and VAC placement). Pathology returned as metastatic adenocarcinoma. Pt with L1 compression fracture.   Pt s/p PICC line 8/1  Pt continues to tolerate cycled TPN. Refeed labs stable. Triglycerides stable. Per chart, pt remains up >30lbs from her UBW; MD aware. Plan is for possible surgery 8/20.   Medications reviewed and include: lasix, insulin,  synthroid, protonix, vitamin A, vitamin D, TPN  -Selenium- 98 wnl, zinc- 67 wnl, iodine- 49.8 wnl, chromium- 2.3(H), copper- 93 wnl, manganese- 19.0 wnl- 7/22 -vitamin D- 39.67 wnl, vitamin A 12.6(L), -Vitamin B1- 70.3 wnl, B6- 16.4 wnl, vitamin C- 0.4 wnl, vitamin E- 11.8 wnl, vitamin K 1.43 wnl- 7/22 -Ferritin 21, folate 14.8 wnl, B12- 870 wnl- 7/22 -carnitine 32.0 wnl- 4/12 -aluminum 10(H)- 4/12 --Iron 25(L), TIBC 398- 4/12  Labs reviewed: Na 135 wnl, K 4.0 wnl- 8/13 P 3.0 wnl, Mg 2.2 wnl- 8/12 Triglycerides- 155(H)- 8/12 Wbc- 2.6(L), Hgb 8.3(L), Hct 26.5(L)- 8/12 Cbgs- 139, 159 x 24 hrs  Diet Order:    Diet Order             DIET SOFT Fluid consistency: Thin  Diet effective now                  EDUCATION NEEDS:   Not appropriate for education at this time  Skin:  Skin Assessment: Reviewed RN Assessment (EC fistula)  Last BM:  8/9  Height:   Ht Readings from Last 1 Encounters:  12/19/22 4\' 11"  (1.499 m)    Weight:   Wt Readings from Last 1 Encounters:  04/23/23 109 kg    Ideal Body Weight:  44.3 kg  BMI:  Body mass index is 48.53 kg/m.  Estimated Nutritional Needs:   Kcal:  1800-2100kcal/day  Protein:  90-110g/d  Fluid:  1.4-1.6L/day  Betsey Holiday MS, RD, LDN Please refer to [facility] for RD and/or RD on-call/weekend/after hours pager

## 2023-04-23 NOTE — Progress Notes (Signed)
[facility] SURGICAL PROGRESS NOTE   Hospital Day(s): 434.   Post op day(s): 125 Days Post-Op.   Interval History:  Patient seen and examined Nothing acute overnight No acute change this morning [name] output unmeasured in last 24 hours On cyclic TPN + soft diet  Ambulating well; independent  Review of Systems:  Constitutional: denies fever, chills  HEENT: denies cough or congestion  Respiratory: denies any shortness of breath  Cardiovascular: denies chest pain or palpitations  Gastrointestinal: denied abdominal pain, denied N/V Genitourinary: denies burning with urination or urinary frequency Integumentary: + midline wound (healing; stable)  Vital signs in last 24 hours: [min-max] current  Temp:  [97.7 F (36.5 C)-99.6 F (37.6 C)] 99.6 F (37.6 C) (08/15 0357) Pulse Rate:  [70-81] 81 (08/15 0357) Resp:  [16-18] 18 (08/15 0357) BP: (96-117)/(52-62) 111/52 (08/15 0357) SpO2:  [92 %-100 %] 92 % (08/15 0357) Weight:  [109 kg] 109 kg (08/15 0356)     Height: 4\' 11"  (149.9 cm) Weight: 109 kg BMI (Calculated): 46.02   Intake/Output last 2 shifts:  08/14 0701 - 08/15 0700 In: 1659.8 [P.O.:120; I.V.:1539.8] Out: -    Physical Exam:  Constitutional: alert, cooperative and no distress  Respiratory: breathing non-labored at rest  Cardiovascular: regular rate and sinus rhythm  Gastrointestinal: Soft, non-tender, non-distended, no rebound/guarding. Integumentary: Midline wound healing by secondary intention, peritoneum closed; granulating and new skin growing over majority of wound now; there are three stomatized areas visible in the LUQ and LLQ portions of the wound. No active bleeding this morning  Chest: Right chest port site healing via secondary intention; no erythema    Labs:     Latest Ref Rng & Units 04/20/2023    4:14 AM 04/05/2023   10:30 AM 04/04/2023    6:31 AM  CBC  WBC 4.0 - 10.5 K/uL 2.6  4.8  8.1   Hemoglobin 12.0 - 15.0 g/dL 8.3  7.4  8.2    Hematocrit 36.0 - 46.0 % 26.5  22.9  26.0   Platelets 150 - 400 K/uL 59  63  86       Latest Ref Rng & Units 04/21/2023    7:56 AM 04/20/2023    4:14 AM 04/17/2023    9:01 AM  CMP  Glucose 70 - 99 mg/dL 784  696  295   BUN 6 - 20 mg/dL 24  22  25    Creatinine 0.44 - 1.00 mg/dL 2.84  1.32  4.40   Sodium 135 - 145 mmol/L 136  135  139   Potassium 3.5 - 5.1 mmol/L 4.0  3.4  4.0   Chloride 98 - 111 mmol/L 106  102  108   CO2 22 - 32 mmol/L 25  26  25    Calcium 8.9 - 10.3 mg/dL 7.9  7.9  8.1   Total Protein 6.5 - 8.1 g/dL  5.9    Total Bilirubin 0.3 - 1.2 mg/dL  0.8    Alkaline Phos 38 - 126 U/L  171    AST 15 - 41 U/L  51    ALT 0 - 44 U/L  34      Imaging studies: No new imaging studies this morning    Assessment/Plan:  57 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  New 08/15: - No acute changes this AM - Continue local wound care for port site daily - Surgery 08/20  - Wound Care ([name] Pouch); [name] pouch; continues managing effluent independently. Intermittent oozing from time to time. Change as needed.   - Continue soft diet as tolerated  - Continue cyclic TPN; weekly nutritional labs - Appreciate dietary assistance - monitoring weight             - Monitor abdominal examination; on-going bowel function            - Pain control prn; antemetic prn            - Mobilizing without issue   All of the above findings and recommendations were discussed with the patient, and the medical team, and all of patient's questions were answered to her expressed satisfaction.  -- [name], PA-C [facility] 04/23/2023, 7:19 AM M-F: 7am -  4pm

## 2023-04-23 NOTE — Progress Notes (Signed)
PHARMACY - TOTAL PARENTERAL NUTRITION CONSULT NOTE   Indication: Prolonged ileus  Patient Measurements: Height: 4\' 11"  (149.9 cm) Weight: 109 kg (240 lb 4.8 oz) IBW/kg (Calculated) : 43.2 TPN AdjBW (KG): 57.8 Body mass index is 48.53 kg/m.  Assessment: Debra [name] is a 57 y.o. female s/p laparotomy, excision of greater omental mass, abdominal wall reconstruction with [name], appendectomy, and placement of Prevena vac.  Glucose / Insulin:  --Last Hgb A1c 6.8% on 04/02/23 --mSSI 4x/day (0500, 1300, 1800, 2200) --BG last 24h: 110s - 170s --SSI last 24h: 10 units Electrolytes: wnl Renal: SCr stable at baseline Hepatic: AST 51, ALT 34, Tbili 0.8 TG: 8/12: 155 GI Imaging: 9/11 CTAP: no new acute issues GI Surgeries / Procedures: s/p laparotomy, excision of greater omental mass, abdominal wall reconstruction with [name], appendectomy, and placement of Prevena vac 12/19/22 placement of right IJ port-a-catheter with dual reservoirs also s/p re-opening of laparotomy for repair of small bowel perforation   Central access: PICC 04/09/2023   TPN start date: 04/09/23    (was on TPN from 02/15/22 to 04/05/23- held for bacteremia and TPN restarted 04/09/23)  Nutritional Goals: Goal cyclic TPN over 16 hrs: cyclic TPN over 16 hrs: (provides 115.5 g of protein and 1614 kcals per day) total volume over 16hrs for calculations=1500 ml  RD Assessment:  Estimated Needs Total Energy Estimated Needs: 1800-2100kcal/day Total Protein Estimated Needs: 90-110g/d Total Fluid Estimated Needs: 1.4-1.6L/day  Current Nutrition:  Soft diet + nutritional supplements, not meeting PO needs  Plan:  Transitioned to *Cyclic* TPN on 1/61/09.   to run over 16 hours per MD request. [name] at 2000 per discussion with dietician to allow patient time for walking in afternoon/evenings.  To run over 16 hours: -Start rate at 53 mL/hr for 1 hour. -Increase rate to 105 mL/hr for 13 hours.   -Decrease rate to 53 mL/hr for 1 hour. -Decrease rate to 26 mL/hr for 1 hour, then stop.  Plan:  8/3: continue cyclic TPN at goal rate  See cyclic TPN rate above Nutritional Components Amino acids (using 15% Clinisol): 115.5 Dextrose 12% = 180 g  Lipids (using 20% SMOFlipids): 52.2 g kCal: 1614/24h  7/25: Adjusted Dextrose from 17% to 12% at physician request and increase Amino acids from 108g to 115.5g per dietary. Total volume increasing from to  to see if helps with blood sugars Electrolytes in TPN: Na 75 mEq/L, K 50 mEq/L, Ca 40mEq/L, Mg 10 mEq/L, Phos 10 mmol/L, Cl:Ac 1:1 Resume MVI, trace elements, chromium, zinc 10 mg, and selenium 150 mcg per dietary on 03/30/23 Continue CBG/SSI for Cyclic TPN:  -CBG 2 hrs after cyclic TPN start -CBG during middle of cyclic TPN -CBG 1 hr after cylic TPN stopped -CBG while off TPN Continue SSI insulin at moderate scale  Check TPN labs weekly on Mondays and more frequently if indicated   Gara Kroner, PharmD PGY1 Pharmacy Resident 04/23/2023 11:53 AM

## 2023-04-23 NOTE — TOC Progression Note (Signed)
Transition of Care [facility]) - Progression Note    Patient Details  Name: Debra [name] MRN: [id number] Date of Birth: Apr 26, 1966  Transition of Care [facility]) CM/SW Contact  Darleene Cleaver, [place] Phone Number: 04/23/2023, 8:36 PM  Clinical Narrative:     TOC continuing to follow patient's progress for discharge planning.   Expected Discharge Plan: Home w Home Health Services Barriers to Discharge: Continued Medical Work up  Expected Discharge Plan and Services                                               Social Determinants of Health (SDOH) Interventions SDOH Screenings   Food Insecurity: No Food Insecurity (05/20/2022)  Housing: Low Risk  (05/20/2022)  Transportation Needs: No Transportation Needs (05/20/2022)  Utilities: Not At Risk (05/20/2022)  Tobacco Use: Low Risk  (05/19/2022)    Readmission Risk Interventions     No data to display

## 2023-04-24 LAB — GLUCOSE, CAPILLARY
Glucose-Capillary: 178 mg/dL — ABNORMAL HIGH (ref 70–99)
Glucose-Capillary: 143 mg/dL — ABNORMAL HIGH (ref 70–99)
Glucose-Capillary: 89 mg/dL (ref 70–99)
Glucose-Capillary: 183 mg/dL — ABNORMAL HIGH (ref 70–99)

## 2023-04-24 MED ORDER — VITAMIN K1 10 MG/ML IJ SOLN
10.0000 mg | Freq: Once | INTRAVENOUS | Status: AC
  Administered 2023-04-24: 10 mg via INTRAVENOUS
  Filled 2023-04-24: qty 1

## 2023-04-24 MED ORDER — ZINC CHLORIDE 1 MG/ML IV SOLN
INTRAVENOUS | Status: AC
  Filled 2023-04-24: qty 770

## 2023-04-24 NOTE — Progress Notes (Signed)
[facility]ESS [facility]   Hospital Day(s): 435.   Post op day(s): 126 Days Post-Op.   Interval History:  Patient seen and examined Nothing acute overnight No acute change this morning BP softer overnight; normalized this AM [name] output unmeasured in last 24 hours On cyclic TPN + soft diet  Ambulating well; independent  Review of Systems:  Constitutional: denies fever, chills  HEENT: denies cough or congestion  Respiratory: denies any shortness of breath  Cardiovascular: denies chest pain or palpitations  Gastrointestinal: denied abdominal pain, denied N/V Genitourinary: denies burning with urination or urinary frequency Integumentary: + midline wound (healing; stable)  Vital signs in last 24 hours: [min-max] current  Temp:  [98.1 F (36.7 C)-98.7 F (37.1 C)] 98.4 F (36.9 C) (08/16 0455) Pulse Rate:  [67-78] 74 (08/16 0455) Resp:  [18] 18 (08/16 0455) BP: (98-119)/(49-66) 99/50 (08/16 0503) SpO2:  [95 %-98 %] 95 % (08/16 0455) Weight:  [108.9 kg] 108.9 kg (08/16 0500)     Height: 4\' 11"  (149.9 cm) Weight: 108.9 kg BMI (Calculated): 46.02   Intake/Output last 2 shifts:  08/15 0701 - 08/16 0700 In: 1895.5 [P.O.:720; I.V.:1175.5] Out: -    Physical Exam:  Constitutional: alert, cooperative and no distress  Respiratory: breathing non-labored at rest  Cardiovascular: regular rate and sinus rhythm  Gastrointestinal: Soft, non-tender, non-distended, no rebound/guarding. Integumentary: Midline wound healing by secondary intention, peritoneum closed; granulating and new skin growing over majority of wound now; there are three stomatized areas visible in the LUQ and LLQ portions of the wound. No active bleeding this morning  Chest: Right chest port site healing via secondary intention; no erythema    Labs:     Latest Ref Rng & Units 04/20/2023    4:14 AM 04/05/2023   10:30 AM 04/04/2023    6:31 AM  CBC  WBC 4.0 - 10.5 K/uL 2.6  4.8  8.1    Hemoglobin 12.0 - 15.0 g/dL 8.3  7.4  8.2   Hematocrit 36.0 - 46.0 % 26.5  22.9  26.0   Platelets 150 - 400 K/uL 59  63  86       Latest Ref Rng & Units 04/21/2023    7:56 AM 04/20/2023    4:14 AM 04/17/2023    9:01 AM  CMP  Glucose 70 - 99 mg/dL 098  119  147   BUN 6 - 20 mg/dL 24  22  25    Creatinine 0.44 - 1.00 mg/dL 8.29  5.62  1.30   Sodium 135 - 145 mmol/L 136  135  139   Potassium 3.5 - 5.1 mmol/L 4.0  3.4  4.0   Chloride 98 - 111 mmol/L 106  102  108   CO2 22 - 32 mmol/L 25  26  25    Calcium 8.9 - 10.3 mg/dL 7.9  7.9  8.1   Total Protein 6.5 - 8.1 g/dL  5.9    Total Bilirubin 0.3 - 1.2 mg/dL  0.8    Alkaline Phos 38 - 126 U/L  171    AST 15 - 41 U/L  51    ALT 0 - 44 U/L  34      Imaging studies: No new imaging studies this morning    Assessment/Plan:  57 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  New 08/16: - No acute changes this AM - Continued plan for surgery 08/20  - Wound Care ([name] Pouch); [name] pouch; continues managing effluent independently. Intermittent oozing from time to time. Change as needed.   - Continue soft diet as tolerated  - Continue cyclic TPN; weekly nutritional labs - Appreciate dietary assistance - monitoring weight             - Monitor abdominal examination; on-going bowel function            - Pain control prn; antemetic prn            - Mobilizing without issue   All of the above findings and recommendations were discussed with the patient, and the medical team, and all of patient's questions were answered to her expressed satisfaction.  -- [name],  PA-C [facility] 04/24/2023, 7:51 AM M-F: 7am - 4pm

## 2023-04-24 NOTE — Plan of Care (Signed)
  Problem: Clinical Measurements: Goal: Ability to maintain clinical measurements within normal limits will improve Outcome: Progressing Goal: Will remain free from infection Outcome: Progressing Goal: Diagnostic test results will improve Outcome: Progressing Goal: Respiratory complications will improve Outcome: Progressing Goal: Cardiovascular complication will be avoided Outcome: Progressing   Problem: Clinical Measurements: Goal: Will remain free from infection Outcome: Progressing   Problem: Nutrition: Goal: Adequate nutrition will be maintained Outcome: Progressing   Problem: Activity: Goal: Risk for activity intolerance will decrease Outcome: Progressing   Problem: Nutrition: Goal: Adequate nutrition will be maintained Outcome: Progressing   Problem: Coping: Goal: Level of anxiety will decrease Outcome: Progressing   Problem: Pain Managment: Goal: General experience of comfort will improve Outcome: Progressing   

## 2023-04-24 NOTE — Progress Notes (Signed)
PHARMACY - TOTAL PARENTERAL NUTRITION CONSULT NOTE   Indication: Prolonged ileus  Patient Measurements: Height: 4\' 11"  (149.9 cm) Weight: 108.9 kg (240 lb 1.3 oz) IBW/kg (Calculated) : 43.2 TPN AdjBW (KG): 57.8 Body mass index is 48.49 kg/m.  Assessment: Debra [name] is a 57 y.o. female s/p laparotomy, excision of greater omental mass, abdominal wall reconstruction with [name], appendectomy, and placement of Prevena vac.  Glucose / Insulin:  --Last Hgb A1c 6.8% on 04/02/23 --mSSI 4x/day (0500, 1300, 1800, 2200) --BG last 24h: 110s - 160s --SSI last 24h: 12 units Electrolytes: wnl Renal: SCr stable at baseline Hepatic: AST 51, ALT 34, Tbili 0.8 TG: 8/12: 155 GI Imaging: 9/11 CTAP: no new acute issues GI Surgeries / Procedures: s/p laparotomy, excision of greater omental mass, abdominal wall reconstruction with [name], appendectomy, and placement of Prevena vac 12/19/22 placement of right IJ port-a-catheter with dual reservoirs also s/p re-opening of laparotomy for repair of small bowel perforation   Central access: PICC 04/09/2023   TPN start date: 04/09/23    (was on TPN from 02/15/22 to 04/05/23- held for bacteremia and TPN restarted 04/09/23)  Nutritional Goals: Goal cyclic TPN over 16 hrs: cyclic TPN over 16 hrs: (provides 115.5 g of protein and 1614 kcals per day) total volume over 16hrs for calculations=1500 ml  RD Assessment:  Estimated Needs Total Energy Estimated Needs: 1800-2100kcal/day Total Protein Estimated Needs: 90-110g/d Total Fluid Estimated Needs: 1.4-1.6L/day  Current Nutrition:  Soft diet + nutritional supplements, not meeting PO needs  Plan:  Transitioned to *Cyclic* TPN on 9/62/95.   to run over 16 hours per MD request. [name] at 2000 per discussion with dietician to allow patient time for walking in afternoon/evenings.  To run over 16 hours: -Start rate at 53 mL/hr for 1 hour. -Increase rate to 105 mL/hr for 13 hours.   -Decrease rate to 53 mL/hr for 1 hour. -Decrease rate to 26 mL/hr for 1 hour, then stop.  Plan:  8/3: continue cyclic TPN at goal rate  See cyclic TPN rate above Nutritional Components Amino acids (using 15% Clinisol): 115.5 Dextrose 12% = 180 g  Lipids (using 20% SMOFlipids): 52.2 g kCal: 1614/24h  7/25: Adjusted Dextrose from 17% to 12% at physician request and increase Amino acids from 108g to 115.5g per dietary. Total volume increasing from to  to see if helps with blood sugars Electrolytes in TPN: Na 75 mEq/L, K 50 mEq/L, Ca 39mEq/L, Mg 10 mEq/L, Phos 10 mmol/L, Cl:Ac 1:1 Resume MVI, trace elements, chromium, zinc 10 mg, and selenium 150 mcg per dietary on 03/30/23 Continue CBG/SSI for Cyclic TPN:  -CBG 2 hrs after cyclic TPN start -CBG during middle of cyclic TPN -CBG 1 hr after cylic TPN stopped -CBG while off TPN Continue SSI insulin at moderate scale  Check TPN labs weekly on Mondays and more frequently if indicated   Gara Kroner, PharmD PGY1 Pharmacy Resident 04/24/2023 9:50 AM

## 2023-04-25 MED ORDER — ZINC CHLORIDE 1 MG/ML IV SOLN
INTRAVENOUS | Status: AC
  Filled 2023-04-25: qty 770

## 2023-04-25 NOTE — Progress Notes (Signed)
PHARMACY - TOTAL PARENTERAL NUTRITION CONSULT NOTE   Indication: Prolonged ileus  Patient Measurements: Height: 4\' 11"  (149.9 cm) Weight: 108.9 kg (240 lb 1.3 oz) IBW/kg (Calculated) : 43.2 TPN AdjBW (KG): 57.8 Body mass index is 48.49 kg/m.  Assessment: Debra [name] is a 57 y.o. female s/p laparotomy, excision of greater omental mass, abdominal wall reconstruction with [name], appendectomy, and placement of Prevena vac.  Glucose / Insulin:  --Last Hgb A1c 6.8% on 04/02/23 --mSSI 4x/day (0500, 1300, 1800, 2200) --BG last 24h: 110s - 160s --SSI last 24h: 8 units Electrolytes: wnl Renal: SCr stable at baseline Hepatic: AST 51, ALT 34, Tbili 0.8 TG: 8/12: 155 GI Imaging: 9/11 CTAP: no new acute issues GI Surgeries / Procedures: s/p laparotomy, excision of greater omental mass, abdominal wall reconstruction with [name], appendectomy, and placement of Prevena vac 12/19/22 placement of right IJ port-a-catheter with dual reservoirs also s/p re-opening of laparotomy for repair of small bowel perforation   Central access: PICC 04/09/2023   TPN start date: 04/09/23    (was on TPN from 02/15/22 to 04/05/23- held for bacteremia and TPN restarted 04/09/23)  Nutritional Goals: Goal cyclic TPN over 16 hrs: cyclic TPN over 16 hrs: (provides 115.5 g of protein and 1614 kcals per day) total volume over 16hrs for calculations=1500 ml  RD Assessment:  Estimated Needs Total Energy Estimated Needs: 1800-2100kcal/day Total Protein Estimated Needs: 90-110g/d Total Fluid Estimated Needs: 1.4-1.6L/day  Current Nutrition:  Soft diet + nutritional supplements, not meeting PO needs  Plan:  Transitioned to *Cyclic* TPN on 09/26/12.   to run over 16 hours per MD request. [name] at 2000 per discussion with dietician to allow patient time for walking in afternoon/evenings.  To run over 16 hours: -Start rate at 53 mL/hr for 1 hour. -Increase rate to 105 mL/hr for 13 hours.   -Decrease rate to 53 mL/hr for 1 hour. -Decrease rate to 26 mL/hr for 1 hour, then stop.  Plan:  8/3: continue cyclic TPN at goal rate  See cyclic TPN rate above Nutritional Components Amino acids (using 15% Clinisol): 115.5 Dextrose 12% = 180 g  Lipids (using 20% SMOFlipids): 52.2 g kCal: 1614/24h  7/25: Adjusted Dextrose from 17% to 12% at physician request and increase Amino acids from 108g to 115.5g per dietary. Total volume increasing from to  to see if helps with blood sugars Electrolytes in TPN: Na 75 mEq/L, K 50 mEq/L, Ca 66mEq/L, Mg 10 mEq/L, Phos 10 mmol/L, Cl:Ac 1:1 Resume MVI, trace elements, chromium, zinc 10 mg, and selenium 150 mcg per dietary on 03/30/23 Continue CBG/SSI for Cyclic TPN:  -CBG 2 hrs after cyclic TPN start -CBG during middle of cyclic TPN -CBG 1 hr after cylic TPN stopped -CBG while off TPN Continue SSI insulin at moderate scale  Check TPN labs weekly on Mondays and more frequently if indicated   Paschal Dopp, PharmD 04/25/2023 9:17 AM

## 2023-04-25 NOTE — Plan of Care (Signed)
  Problem: Clinical Measurements: Goal: Ability to maintain clinical measurements within normal limits will improve Outcome: Progressing Goal: Will remain free from infection Outcome: Progressing Goal: Diagnostic test results will improve Outcome: Progressing Goal: Respiratory complications will improve Outcome: Progressing Goal: Cardiovascular complication will be avoided Outcome: Progressing   Problem: Activity: Goal: Risk for activity intolerance will decrease Outcome: Progressing   Problem: Coping: Goal: Level of anxiety will decrease Outcome: Progressing   Problem: Clinical Measurements: Goal: Will remain free from infection Outcome: Progressing   Problem: Pain Managment: Goal: General experience of comfort will improve Outcome: Progressing

## 2023-04-25 NOTE — Progress Notes (Signed)
[facility] SURGICAL PROGRESS NOTE   Hospital Day(s): 436.   Post op day(s): 127 Days Post-Op.   Interval History:  Patient seen and examined Nothing acute overnight No acute change this morning [name] output 100 mL in last 24 hours On cyclic TPN + soft diet  Ambulating well; independent  Review of Systems:  Constitutional: denies fever, chills  HEENT: denies cough or congestion  Respiratory: denies any shortness of breath  Cardiovascular: denies chest pain or palpitations  Gastrointestinal: denied abdominal pain, denied N/V Genitourinary: denies burning with urination or urinary frequency Integumentary: + midline wound (healing; stable)  Vital signs in last 24 hours: [min-max] current  Temp:  [97.2 F (36.2 C)-98.5 F (36.9 C)] 98.5 F (36.9 C) (08/17 0435) Pulse Rate:  [65-84] 79 (08/17 0435) Resp:  [16-20] 20 (08/17 0435) BP: (96-128)/(51-71) 96/51 (08/17 0435) SpO2:  [94 %-100 %] 94 % (08/17 0435)     Height: 4\' 11"  (149.9 cm) Weight: 108.9 kg BMI (Calculated): 46.02   Intake/Output last 2 shifts:  08/16 0701 - 08/17 0700 In: -  Out: 100 [Stool:100]   Physical Exam:  Constitutional: alert, cooperative and no distress  Respiratory: breathing non-labored at rest  Cardiovascular: regular rate and sinus rhythm  Gastrointestinal: Soft, non-tender, non-distended, no rebound/guarding. Integumentary: Midline wound healing by secondary intention, peritoneum closed; granulating and new skin growing over majority of wound now; there are three stomatized areas visible in the LUQ and LLQ portions of the wound. No active bleeding this morning  Chest: Right chest port site healing via secondary intention; no erythema    Labs:     Latest Ref Rng & Units 04/20/2023    4:14 AM 04/05/2023   10:30 AM 04/04/2023    6:31 AM  CBC  WBC 4.0 - 10.5 K/uL 2.6  4.8  8.1   Hemoglobin 12.0 - 15.0 g/dL 8.3  7.4  8.2   Hematocrit 36.0 - 46.0 % 26.5  22.9  26.0   Platelets  150 - 400 K/uL 59  63  86       Latest Ref Rng & Units 04/21/2023    7:56 AM 04/20/2023    4:14 AM 04/17/2023    9:01 AM  CMP  Glucose 70 - 99 mg/dL 756  433  295   BUN 6 - 20 mg/dL 24  22  25    Creatinine 0.44 - 1.00 mg/dL 1.88  4.16  6.06   Sodium 135 - 145 mmol/L 136  135  139   Potassium 3.5 - 5.1 mmol/L 4.0  3.4  4.0   Chloride 98 - 111 mmol/L 106  102  108   CO2 22 - 32 mmol/L 25  26  25    Calcium 8.9 - 10.3 mg/dL 7.9  7.9  8.1   Total Protein 6.5 - 8.1 g/dL  5.9    Total Bilirubin 0.3 - 1.2 mg/dL  0.8    Alkaline Phos 38 - 126 U/L  171    AST 15 - 41 U/L  51    ALT 0 - 44 U/L  34      Imaging studies: No new imaging studies this morning    Assessment/Plan:  57 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  New 08/17: - No acute changes this AM - Continued plan for surgery 08/20  - Wound Care ([name] Pouch); [name] pouch; continues managing effluent independently. Intermittent oozing from time to time. Change as needed.   - Continue soft diet as tolerated  - Continue cyclic TPN; weekly nutritional labs - Appreciate dietary assistance - monitoring weight             - Monitor abdominal examination; on-going bowel function            - Pain control prn; antemetic prn            - Mobilizing without issue   All of the above findings and recommendations were discussed with the patient, and the medical team, and all of patient's questions were answered to her expressed satisfaction.  -- Campbell Lerner, M.D., [facility]  04/25/2023 ; 10:37 AM

## 2023-04-26 LAB — GLUCOSE, CAPILLARY
Glucose-Capillary: 109 mg/dL — ABNORMAL HIGH (ref 70–99)
Glucose-Capillary: 128 mg/dL — ABNORMAL HIGH (ref 70–99)

## 2023-04-26 MED ORDER — ZINC CHLORIDE 1 MG/ML IV SOLN
INTRAVENOUS | Status: AC
  Filled 2023-04-26: qty 770

## 2023-04-26 NOTE — Progress Notes (Signed)
PHARMACY - TOTAL PARENTERAL NUTRITION CONSULT NOTE   Indication: Prolonged ileus  Patient Measurements: Height: 4\' 11"  (149.9 cm) Weight: 108 kg (238 lb 1.6 oz) IBW/kg (Calculated) : 43.2 TPN AdjBW (KG): 57.8 Body mass index is 48.09 kg/m.  Assessment: Debra [name] is a 57 y.o. female s/p laparotomy, excision of greater omental mass, abdominal wall reconstruction with [name], appendectomy, and placement of Prevena vac.  Glucose / Insulin:  --Last Hgb A1c 6.8% on 04/02/23 --mSSI 4x/day (0500, 1300, 1800, 2200) --BG last 24h: 110s - 160s --SSI last 24h: 8 units Electrolytes: wnl Renal: SCr stable at baseline Hepatic: AST 51, ALT 34, Tbili 0.8 TG: 8/12: 155 GI Imaging: 9/11 CTAP: no new acute issues GI Surgeries / Procedures: s/p laparotomy, excision of greater omental mass, abdominal wall reconstruction with [name], appendectomy, and placement of Prevena vac 12/19/22 placement of right IJ port-a-catheter with dual reservoirs also s/p re-opening of laparotomy for repair of small bowel perforation   Central access: PICC 04/09/2023   TPN start date: 04/09/23    (was on TPN from 02/15/22 to 04/05/23- held for bacteremia and TPN restarted 04/09/23)  Nutritional Goals: Goal cyclic TPN over 16 hrs: cyclic TPN over 16 hrs: (provides 115.5 g of protein and 1614 kcals per day) total volume over 16hrs for calculations=1500 ml  RD Assessment:  Estimated Needs Total Energy Estimated Needs: 1800-2100kcal/day Total Protein Estimated Needs: 90-110g/d Total Fluid Estimated Needs: 1.4-1.6L/day  Current Nutrition:  Soft diet + nutritional supplements, not meeting PO needs  Plan:  Transitioned to *Cyclic* TPN on 05/07/55.   to run over 16 hours per MD request. [name] at 2000 per discussion with dietician to allow patient time for walking in afternoon/evenings.  To run over 16 hours: -Start rate at 53 mL/hr for 1 hour. -Increase rate to 105 mL/hr for 13 hours.   -Decrease rate to 53 mL/hr for 1 hour. -Decrease rate to 26 mL/hr for 1 hour, then stop.  Plan:  8/3: continue cyclic TPN at goal rate  See cyclic TPN rate above Nutritional Components Amino acids (using 15% Clinisol): 115.5 Dextrose 12% = 180 g  Lipids (using 20% SMOFlipids): 52.2 g kCal: 1614/24h  7/25: Adjusted Dextrose from 17% to 12% at physician request and increase Amino acids from 108g to 115.5g per dietary. Total volume increasing from to  to see if helps with blood sugars Electrolytes in TPN: Na 75 mEq/L, K 50 mEq/L, Ca 34mEq/L, Mg 10 mEq/L, Phos 10 mmol/L, Cl:Ac 1:1 Resume MVI, trace elements, chromium, zinc 10 mg, and selenium 150 mcg per dietary on 03/30/23 Continue CBG/SSI for Cyclic TPN:  -CBG 2 hrs after cyclic TPN start -CBG during middle of cyclic TPN -CBG 1 hr after cylic TPN stopped -CBG while off TPN Continue SSI insulin at moderate scale  Check TPN labs weekly on Mondays and more frequently if indicated   Paschal Dopp, PharmD 04/26/2023 8:32 AM

## 2023-04-26 NOTE — Plan of Care (Signed)
  Problem: Clinical Measurements: Goal: Ability to maintain clinical measurements within normal limits will improve Outcome: Progressing Goal: Will remain free from infection Outcome: Progressing Goal: Diagnostic test results will improve Outcome: Progressing Goal: Respiratory complications will improve Outcome: Progressing   Problem: Clinical Measurements: Goal: Will remain free from infection Outcome: Progressing   Problem: Nutrition: Goal: Adequate nutrition will be maintained Outcome: Progressing   Problem: Activity: Goal: Risk for activity intolerance will decrease Outcome: Progressing   Problem: Nutrition: Goal: Adequate nutrition will be maintained Outcome: Progressing   Problem: Coping: Goal: Level of anxiety will decrease Outcome: Progressing   Problem: Pain Managment: Goal: General experience of comfort will improve Outcome: Progressing

## 2023-04-26 NOTE — Progress Notes (Signed)
[facility] SURGICAL PROGRESS NOTE   Hospital Day(s): 437.   Post op day(s): 128 Days Post-Op.   Interval History:  Patient seen and examined, sister and another family member present.  Anticipating surgery to come. Nothing acute overnight No acute change this morning [name] output 100 mL in last 24 hours On cyclic TPN + soft diet  Ambulating well; independent  Review of Systems:  Constitutional: denies fever, chills  HEENT: denies cough or congestion  Respiratory: denies any shortness of breath  Cardiovascular: denies chest pain or palpitations  Gastrointestinal: denied abdominal pain, denied N/V Genitourinary: denies burning with urination or urinary frequency Integumentary: + midline wound (healing; stable)  Vital signs in last 24 hours: [min-max] current  Temp:  [97.7 F (36.5 C)-98.4 F (36.9 C)] 98 F (36.7 C) (08/18 0857) Pulse Rate:  [67-79] 79 (08/18 0857) Resp:  [16-20] 18 (08/18 0857) BP: (98-128)/(53-76) 108/55 (08/18 0857) SpO2:  [92 %-100 %] 92 % (08/18 0857) Weight:  [782 kg] 108 kg (08/18 0500)     Height: 4\' 11"  (149.9 cm) Weight: 108 kg BMI (Calculated): 46.02   Intake/Output last 2 shifts:  08/17 0701 - 08/18 0700 In: 1937.7 [P.O.:860; I.V.:1077.7] Out: 100 [Stool:100]   Physical Exam:  Constitutional: alert, cooperative and no distress  Respiratory: breathing non-labored at rest  Cardiovascular: regular rate and sinus rhythm  Gastrointestinal: Soft, non-tender, non-distended, no rebound/guarding. Integumentary: Midline wound healing by secondary intention, peritoneum closed; granulating and new skin growing over majority of wound now; there are three stomatized areas visible in the LUQ and LLQ portions of the wound. No active bleeding this morning  Chest: Right chest port site healing via secondary intention; no erythema    Labs:     Latest Ref Rng & Units 04/20/2023    4:14 AM 04/05/2023   10:30 AM 04/04/2023    6:31 AM  CBC   WBC 4.0 - 10.5 K/uL 2.6  4.8  8.1   Hemoglobin 12.0 - 15.0 g/dL 8.3  7.4  8.2   Hematocrit 36.0 - 46.0 % 26.5  22.9  26.0   Platelets 150 - 400 K/uL 59  63  86       Latest Ref Rng & Units 04/21/2023    7:56 AM 04/20/2023    4:14 AM 04/17/2023    9:01 AM  CMP  Glucose 70 - 99 mg/dL 956  213  086   BUN 6 - 20 mg/dL 24  22  25    Creatinine 0.44 - 1.00 mg/dL 5.78  4.69  6.29   Sodium 135 - 145 mmol/L 136  135  139   Potassium 3.5 - 5.1 mmol/L 4.0  3.4  4.0   Chloride 98 - 111 mmol/L 106  102  108   CO2 22 - 32 mmol/L 25  26  25    Calcium 8.9 - 10.3 mg/dL 7.9  7.9  8.1   Total Protein 6.5 - 8.1 g/dL  5.9    Total Bilirubin 0.3 - 1.2 mg/dL  0.8    Alkaline Phos 38 - 126 U/L  171    AST 15 - 41 U/L  51    ALT 0 - 44 U/L  34      Imaging studies: No new imaging studies this morning    Assessment/Plan:  57 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  New 08/18: - No acute changes this AM - Continued plan for surgery 08/20  - Wound Care ([name] Pouch); [name] pouch; continues managing effluent independently. Intermittent oozing from time to time. Change as needed.   - Continue soft diet as tolerated  - Continue cyclic TPN; weekly nutritional labs - Appreciate dietary assistance - monitoring weight             - Monitor abdominal examination; on-going bowel function            - Pain control prn; antemetic prn            - Mobilizing without issue   All of the above findings and recommendations were discussed with the patient, and the medical team, and all of patient's questions were answered to her expressed  satisfaction.  -- Campbell Lerner, M.D., [facility]  04/26/2023 ; 12:22 PM

## 2023-04-27 ENCOUNTER — Inpatient Hospital Stay: Admit: 2023-04-27 | Payer: Self-pay

## 2023-04-27 LAB — CBC
HCT: 27 % — ABNORMAL LOW (ref 36.0–46.0)
Hemoglobin: 8.6 g/dL — ABNORMAL LOW (ref 12.0–15.0)
MCH: 30.6 pg (ref 26.0–34.0)
MCHC: 31.9 g/dL (ref 30.0–36.0)
MCV: 96.1 fL (ref 80.0–100.0)
Platelets: 60 10*3/uL — ABNORMAL LOW (ref 150–400)
RBC: 2.81 MIL/uL — ABNORMAL LOW (ref 3.87–5.11)
RDW: 21.3 % — ABNORMAL HIGH (ref 11.5–15.5)
WBC: 2.4 10*3/uL — ABNORMAL LOW (ref 4.0–10.5)
nRBC: 0 % (ref 0.0–0.2)

## 2023-04-27 LAB — COMPREHENSIVE METABOLIC PANEL
ALT: 32 U/L (ref 0–44)
AST: 45 U/L — ABNORMAL HIGH (ref 15–41)
Albumin: 2.3 g/dL — ABNORMAL LOW (ref 3.5–5.0)
Alkaline Phosphatase: 170 U/L — ABNORMAL HIGH (ref 38–126)
Anion gap: 4 — ABNORMAL LOW (ref 5–15)
BUN: 22 mg/dL — ABNORMAL HIGH (ref 6–20)
CO2: 28 mmol/L (ref 22–32)
Calcium: 8 mg/dL — ABNORMAL LOW (ref 8.9–10.3)
Chloride: 106 mmol/L (ref 98–111)
Creatinine, Ser: 0.54 mg/dL (ref 0.44–1.00)
GFR, Estimated: >60 mL/min (ref >60)
Glucose, Bld: 185 mg/dL — ABNORMAL HIGH (ref 70–99)
Potassium: 3.7 mmol/L (ref 3.5–5.1)
Sodium: 138 mmol/L (ref 135–145)
Total Bilirubin: 0.6 mg/dL (ref 0.3–1.2)
Total Protein: 5.8 g/dL — ABNORMAL LOW (ref 6.5–8.1)

## 2023-04-27 LAB — GLUCOSE, CAPILLARY
Glucose-Capillary: 184 mg/dL — ABNORMAL HIGH (ref 70–99)
Glucose-Capillary: 139 mg/dL — ABNORMAL HIGH (ref 70–99)
Glucose-Capillary: 78 mg/dL (ref 70–99)
Glucose-Capillary: 82 mg/dL (ref 70–99)
Glucose-Capillary: 139 mg/dL — ABNORMAL HIGH (ref 70–99)
Glucose-Capillary: 180 mg/dL — ABNORMAL HIGH (ref 70–99)
Glucose-Capillary: 143 mg/dL — ABNORMAL HIGH (ref 70–99)
Glucose-Capillary: 181 mg/dL — ABNORMAL HIGH (ref 70–99)
Glucose-Capillary: 142 mg/dL — ABNORMAL HIGH (ref 70–99)
Glucose-Capillary: 110 mg/dL — ABNORMAL HIGH (ref 70–99)
Glucose-Capillary: 123 mg/dL — ABNORMAL HIGH (ref 70–99)

## 2023-04-27 LAB — PROTIME-INR
INR: 1.5 — ABNORMAL HIGH (ref 0.8–1.2)
Prothrombin Time: 17.9 s — ABNORMAL HIGH (ref 11.4–15.2)

## 2023-04-27 LAB — TRIGLYCERIDES: Triglycerides: 166 mg/dL — ABNORMAL HIGH (ref <150)

## 2023-04-27 LAB — MAGNESIUM: Magnesium: 2.1 mg/dL (ref 1.7–2.4)

## 2023-04-27 LAB — PREPARE RBC (CROSSMATCH)

## 2023-04-27 LAB — PHOSPHORUS: Phosphorus: 2.9 mg/dL (ref 2.5–4.6)

## 2023-04-27 MED ORDER — SODIUM CHLORIDE 0.9% IV SOLUTION: Freq: Once | INTRAVENOUS | Status: DC

## 2023-04-27 MED ORDER — SODIUM CHLORIDE 0.9 % IV SOLN: 2.0000 g | INTRAVENOUS | Status: AC

## 2023-04-27 MED ORDER — ZINC CHLORIDE 1 MG/ML IV SOLN
INTRAVENOUS | Status: AC
  Filled 2023-04-27: qty 770

## 2023-04-27 NOTE — TOC Progression Note (Signed)
Transition of Care [facility]) - Progression Note    Patient Details  Name: Debra [name] MRN: [id number] Date of Birth: 09/10/1965  [facility] CM/SW Contact  Truddie Hidden, RN Phone Number: 04/27/2023, 11:38 AM  Clinical Narrative:    TOC continuing ongoing assessments for needs that may arise and changes in discharge plan.   Expected Discharge Plan: Home w Home Health Services Barriers to Discharge: Continued Medical Work up  Expected Discharge Plan and Services                                               Social Determinants of Health (SDOH) Interventions SDOH Screenings   Food Insecurity: No Food Insecurity (05/20/2022)  Housing: Low Risk  (05/20/2022)  Transportation Needs: No Transportation Needs (05/20/2022)  Utilities: Not At Risk (05/20/2022)  Tobacco Use: Low Risk  (05/19/2022)    Readmission Risk Interventions     No data to display

## 2023-04-27 NOTE — Progress Notes (Signed)
PHARMACY - TOTAL PARENTERAL NUTRITION CONSULT NOTE   Indication: Prolonged ileus  Patient Measurements: Height: 4\' 11"  (149.9 cm) Weight: 108 kg (238 lb 1.6 oz) IBW/kg (Calculated) : 43.2 TPN AdjBW (KG): 57.8 Body mass index is 48.09 kg/m.  Assessment: Debra [name] is a 57 y.o. female s/p laparotomy, excision of greater omental mass, abdominal wall reconstruction with [name], appendectomy, and placement of Prevena vac.  Glucose / Insulin:  --Last Hgb A1c 6.8% on 04/02/23 --mSSI 4x/day (0500, 1300, 1800, 2200) --BG last 24h: 180 --SSI last 24h: 3 units Electrolytes: wnl Renal: SCr stable at baseline Hepatic: AST 45, ALT 32, Tbili 0.6 TG: 8/12: 166 GI Imaging: 9/11 CTAP: no new acute issues GI Surgeries / Procedures: s/p laparotomy, excision of greater omental mass, abdominal wall reconstruction with [name], appendectomy, and placement of Prevena vac 12/19/22 placement of right IJ port-a-catheter with dual reservoirs also s/p re-opening of laparotomy for repair of small bowel perforation   Central access: PICC 04/09/2023   TPN start date: 04/09/23    (was on TPN from 02/15/22 to 04/05/23- held for bacteremia and TPN restarted 04/09/23)  Nutritional Goals: Goal cyclic TPN over 16 hrs: cyclic TPN over 16 hrs: (provides 115.5 g of protein and 1614 kcals per day) total volume over 16hrs for calculations=1500 ml  RD Assessment:  Estimated Needs Total Energy Estimated Needs: 1800-2100kcal/day Total Protein Estimated Needs: 90-110g/d Total Fluid Estimated Needs: 1.4-1.6L/day  Current Nutrition:  Soft diet + nutritional supplements, not meeting PO needs  Plan:  Transitioned to *Cyclic* TPN on 1/61/09.   to run over 16 hours per MD request. [name] at 2000 per discussion with dietician to allow patient time for walking in afternoon/evenings.  To run over 16 hours: -Start rate at 53 mL/hr for 1 hour. -Increase rate to 105 mL/hr for 13 hours.  -Decrease  rate to 53 mL/hr for 1 hour. -Decrease rate to 26 mL/hr for 1 hour, then stop.  Plan:  8/3: continue cyclic TPN at goal rate  See cyclic TPN rate above Nutritional Components Amino acids (using 15% Clinisol): 115.5 Dextrose 12% = 180 g  Lipids (using 20% SMOFlipids): 52.2 g kCal: 1614/24h  7/25: Adjusted Dextrose from 17% to 12% at physician request and increase Amino acids from 108g to 115.5g per dietary. Total volume increasing from to  to see if helps with blood sugars Electrolytes in TPN: Na 75 mEq/L, K 50 mEq/L, Ca 41mEq/L, Mg 10 mEq/L, Phos 10 mmol/L, Cl:Ac 1:1 Resume MVI, trace elements, chromium, zinc 10 mg, and selenium 150 mcg per dietary on 03/30/23 Continue CBG/SSI for Cyclic TPN:  -CBG 2 hrs after cyclic TPN start -CBG during middle of cyclic TPN -CBG 1 hr after cylic TPN stopped -CBG while off TPN Continue SSI insulin at moderate scale  Check TPN labs weekly on Mondays and more frequently if indicated   Paschal Dopp, PharmD 04/27/2023 9:59 AM

## 2023-04-27 NOTE — Progress Notes (Signed)
Port chest wall open area, dressing changed. [name] pouch changed.  Debra [name]

## 2023-04-27 NOTE — Progress Notes (Signed)
[facility] SURGICAL PROGRESS NOTE   Hospital Day(s): 438.   Post op day(s): 129 Days Post-Op.   Interval History:  Patient seen and examined Nothing acute overnight No abdominal pain; nausea; emesis No bleeding from [name] - did leak last night  [name] output unmeasured in last 24 hours; managing effluent independently On cyclic TPN + soft diet  Ambulating well; independent  Labs this morning WBC 2.4K Hgb 8.6 sCr - 0.54 No electrolyte derangements Triglycerides 166   Review of Systems:  Constitutional: denies fever, chills  HEENT: denies cough or congestion  Respiratory: denies any shortness of breath  Cardiovascular: denies chest pain or palpitations  Gastrointestinal: denied abdominal pain, denied N/V Genitourinary: denies burning with urination or urinary frequency Integumentary: + midline wound (healing; stable)  Vital signs in last 24 hours: [min-max] current  Temp:  [98 F (36.7 C)-98.6 F (37 C)] 98 F (36.7 C) (08/19 0410) Pulse Rate:  [79-90] 85 (08/19 0410) Resp:  [16-20] 17 (08/19 0600) BP: (108-135)/(55-81) 111/56 (08/19 0410) SpO2:  [92 %-95 %] 94 % (08/19 0410) Weight:  [108 kg] 108 kg (08/19 0500)     Height: 4\' 11"  (149.9 cm) Weight: 108 kg BMI (Calculated): 46.02   Intake/Output last 2 shifts:  08/18 0701 - 08/19 0700 In: 2243.6 [P.O.:1200; I.V.:1043.6] Out: 900 [Stool:900]   Physical Exam:  Constitutional: alert, cooperative and no distress  Respiratory: breathing non-labored at rest  Cardiovascular: regular rate and sinus rhythm  Gastrointestinal: Soft, non-tender, non-distended, no rebound/guarding. Integumentary: Midline wound healing by secondary intention, peritoneum closed; granulating and new skin growing over majority of wound now; there are three stomatized areas visible in the LUQ and LLQ portions of the wound. No active bleeding this morning  Chest: Right chest port site healing via secondary intention; no erythema     Labs:     Latest Ref Rng & Units 04/27/2023    6:05 AM 04/20/2023    4:14 AM 04/05/2023   10:30 AM  CBC  WBC 4.0 - 10.5 K/uL 2.4  2.6  4.8   Hemoglobin 12.0 - 15.0 g/dL 8.6  8.3  7.4   Hematocrit 36.0 - 46.0 % 27.0  26.5  22.9   Platelets 150 - 400 K/uL 60  59  63       Latest Ref Rng & Units 04/27/2023    6:05 AM 04/21/2023    7:56 AM 04/20/2023    4:14 AM  CMP  Glucose 70 - 99 mg/dL 161  096  045   BUN 6 - 20 mg/dL 22  24  22    Creatinine 0.44 - 1.00 mg/dL 4.09  8.11  9.14   Sodium 135 - 145 mmol/L 138  136  135   Potassium 3.5 - 5.1 mmol/L 3.7  4.0  3.4   Chloride 98 - 111 mmol/L 106  106  102   CO2 22 - 32 mmol/L 28  25  26    Calcium 8.9 - 10.3 mg/dL 8.0  7.9  7.9   Total Protein 6.5 - 8.1 g/dL 5.8   5.9   Total Bilirubin 0.3 - 1.2 mg/dL 0.6   0.8   Alkaline Phos 38 - 126 U/L 170   171   AST 15 - 41 U/L 45   51   ALT 0 - 44 U/L 32   34     Imaging studies: No new imaging studies this morning    Assessment/Plan:  57 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  New 08/19: - Okay for diet today; NPO at midnight - IV Abx (Cefotetan); On call to OR for surgical prophylaxis  - Plan for exploratory laparotomy, small bowel resection, and abdominal wall reconstruction tomorrow (08/20) with Dr Everlene Farrier.  - With help of spanish-english interpretor, All risks, benefits, and alternatives to above procedure(s) were discussed with the patient, all of her questions were answered to her expressed satisfaction, patient expresses she wishes to proceed, and informed consent was obtained.  - Continue local wound care for port site daily  - Wound Care ([name]  Pouch); [name] pouch; continues managing effluent independently. Intermittent oozing from time to time. Change as needed.   - Continue cyclic TPN; weekly nutritional labs - Appreciate dietary assistance - monitoring weight             - Monitor abdominal examination; on-going bowel function            - Pain control prn; antemetic prn            - Mobilizing without issue   All of the above findings and recommendations were discussed with the patient, and the medical team, and all of patient's questions were answered to her expressed satisfaction.  -- [name], PA-C  [facility] 04/27/2023, 8:43 AM M-F: 7am - 4pm

## 2023-04-28 ENCOUNTER — Inpatient Hospital Stay: Payer: Medicaid Other

## 2023-04-28 ENCOUNTER — Encounter: Admission: RE | Disposition: E | Payer: Self-pay | Source: Home / Self Care | Attending: Surgery

## 2023-04-28 DIAGNOSIS — M7989 Other specified soft tissue disorders: Secondary | ICD-10-CM

## 2023-04-28 DIAGNOSIS — K430 Incisional hernia with obstruction, without gangrene: Secondary | ICD-10-CM

## 2023-04-28 DIAGNOSIS — K632 Fistula of intestine: Secondary | ICD-10-CM

## 2023-04-28 HISTORY — PX: BOWEL RESECTION: SHX1257

## 2023-04-28 HISTORY — PX: ABDOMINAL WALL DEFECT REPAIR: SHX53

## 2023-04-28 HISTORY — PX: LAPAROTOMY: SHX154

## 2023-04-28 LAB — BLOOD GAS, ARTERIAL
Acid-base deficit: 5.2 mmol/L — ABNORMAL HIGH (ref 0.0–2.0)
Acid-base deficit: 9.5 mmol/L — ABNORMAL HIGH (ref 0.0–2.0)
Bicarbonate: 21.2 mmol/L (ref 20.0–28.0)
Bicarbonate: 19 mmol/L — ABNORMAL LOW (ref 20.0–28.0)
FIO2: 0.7 %
FIO2: 100 %
MECHVT: 360 mL
Mechanical Rate: 15
O2 Saturation: 99.7 %
O2 Saturation: 100 %
PEEP: 5 cmH2O
Patient temperature: 37
Patient temperature: 37
pCO2 arterial: 44 mmHg (ref 32–48)
pCO2 arterial: 51 mmHg — ABNORMAL HIGH (ref 32–48)
pH, Arterial: 7.29 — ABNORMAL LOW (ref 7.35–7.45)
pH, Arterial: 7.18 — CL (ref 7.35–7.45)
pO2, Arterial: 158 mmHg — ABNORMAL HIGH (ref 83–108)
pO2, Arterial: 136 mmHg — ABNORMAL HIGH (ref 83–108)

## 2023-04-28 LAB — COMPREHENSIVE METABOLIC PANEL
ALT: 33 U/L (ref 0–44)
ALT: 49 U/L — ABNORMAL HIGH (ref 0–44)
AST: 55 U/L — ABNORMAL HIGH (ref 15–41)
AST: 75 U/L — ABNORMAL HIGH (ref 15–41)
Albumin: 2.1 g/dL — ABNORMAL LOW (ref 3.5–5.0)
Albumin: 2.7 g/dL — ABNORMAL LOW (ref 3.5–5.0)
Alkaline Phosphatase: 141 U/L — ABNORMAL HIGH (ref 38–126)
Alkaline Phosphatase: 90 U/L (ref 38–126)
Anion gap: 6 (ref 5–15)
Anion gap: 8 (ref 5–15)
BUN: 22 mg/dL — ABNORMAL HIGH (ref 6–20)
BUN: 27 mg/dL — ABNORMAL HIGH (ref 6–20)
CO2: 23 mmol/L (ref 22–32)
CO2: 20 mmol/L — ABNORMAL LOW (ref 22–32)
Calcium: 10 mg/dL (ref 8.9–10.3)
Calcium: 7.3 mg/dL — ABNORMAL LOW (ref 8.9–10.3)
Chloride: 107 mmol/L (ref 98–111)
Chloride: 110 mmol/L (ref 98–111)
Creatinine, Ser: 0.54 mg/dL (ref 0.44–1.00)
Creatinine, Ser: 0.83 mg/dL (ref 0.44–1.00)
GFR, Estimated: >60 mL/min (ref >60)
GFR, Estimated: >60 mL/min (ref >60)
Glucose, Bld: 235 mg/dL — ABNORMAL HIGH (ref 70–99)
Glucose, Bld: 320 mg/dL — ABNORMAL HIGH (ref 70–99)
Potassium: 4.2 mmol/L (ref 3.5–5.1)
Potassium: 5 mmol/L (ref 3.5–5.1)
Sodium: 136 mmol/L (ref 135–145)
Sodium: 138 mmol/L (ref 135–145)
Total Bilirubin: 1.4 mg/dL — ABNORMAL HIGH (ref 0.3–1.2)
Total Bilirubin: 2.2 mg/dL — ABNORMAL HIGH (ref 0.3–1.2)
Total Protein: 5.1 g/dL — ABNORMAL LOW (ref 6.5–8.1)
Total Protein: 4.9 g/dL — ABNORMAL LOW (ref 6.5–8.1)

## 2023-04-28 LAB — CBC
HCT: 25.7 % — ABNORMAL LOW (ref 36.0–46.0)
HCT: 33.4 % — ABNORMAL LOW (ref 36.0–46.0)
Hemoglobin: 8.3 g/dL — ABNORMAL LOW (ref 12.0–15.0)
Hemoglobin: 11.1 g/dL — ABNORMAL LOW (ref 12.0–15.0)
MCH: 30 pg (ref 26.0–34.0)
MCH: 28.5 pg (ref 26.0–34.0)
MCHC: 32.3 g/dL (ref 30.0–36.0)
MCHC: 33.2 g/dL (ref 30.0–36.0)
MCV: 92.8 fL (ref 80.0–100.0)
MCV: 85.6 fL (ref 80.0–100.0)
Platelets: 75 10*3/uL — ABNORMAL LOW (ref 150–400)
Platelets: 150 10*3/uL (ref 150–400)
RBC: 2.77 MIL/uL — ABNORMAL LOW (ref 3.87–5.11)
RBC: 3.9 MIL/uL (ref 3.87–5.11)
RDW: 20.7 % — ABNORMAL HIGH (ref 11.5–15.5)
RDW: 20.6 % — ABNORMAL HIGH (ref 11.5–15.5)
WBC: 6.1 10*3/uL (ref 4.0–10.5)
WBC: 12.8 10*3/uL — ABNORMAL HIGH (ref 4.0–10.5)
nRBC: 0 % (ref 0.0–0.2)
nRBC: 0.2 % (ref 0.0–0.2)

## 2023-04-28 LAB — MRSA NEXT GEN BY PCR, NASAL: MRSA by PCR Next Gen: NOT DETECTED

## 2023-04-28 LAB — GLUCOSE, CAPILLARY
Glucose-Capillary: 163 mg/dL — ABNORMAL HIGH (ref 70–99)
Glucose-Capillary: 171 mg/dL — ABNORMAL HIGH (ref 70–99)
Glucose-Capillary: 196 mg/dL — ABNORMAL HIGH (ref 70–99)
Glucose-Capillary: 300 mg/dL — ABNORMAL HIGH (ref 70–99)
Glucose-Capillary: 340 mg/dL — ABNORMAL HIGH (ref 70–99)

## 2023-04-28 LAB — PREPARE RBC (CROSSMATCH)

## 2023-04-28 LAB — LACTIC ACID, PLASMA: Lactic Acid, Venous: 3.6 mmol/L (ref 0.5–1.9)

## 2023-04-28 LAB — PROTIME-INR
INR: 1.4 — ABNORMAL HIGH (ref 0.8–1.2)
INR: 1.5 — ABNORMAL HIGH (ref 0.8–1.2)
Prothrombin Time: 17.2 seconds — ABNORMAL HIGH (ref 11.4–15.2)
Prothrombin Time: 18.4 seconds — ABNORMAL HIGH (ref 11.4–15.2)

## 2023-04-28 LAB — PHOSPHORUS: Phosphorus: 5.8 mg/dL — ABNORMAL HIGH (ref 2.5–4.6)

## 2023-04-28 LAB — MAGNESIUM: Magnesium: 1.7 mg/dL (ref 1.7–2.4)

## 2023-04-28 LAB — APTT: aPTT: 33 seconds (ref 24–36)

## 2023-04-28 LAB — HEMOGLOBIN AND HEMATOCRIT, BLOOD
HCT: 25.9 % — ABNORMAL LOW (ref 36.0–46.0)
Hemoglobin: 8.7 g/dL — ABNORMAL LOW (ref 12.0–15.0)

## 2023-04-28 SURGERY — LAPAROTOMY, EXPLORATORY
Anesthesia: General | Site: Abdomen

## 2023-04-28 MED ORDER — MIDAZOLAM HCL 2 MG/2ML IJ SOLN
INTRAMUSCULAR | Status: AC
  Filled 2023-04-28: qty 2

## 2023-04-28 MED ORDER — HYDROMORPHONE HCL 1 MG/ML IJ SOLN
INTRAMUSCULAR | Status: AC
  Filled 2023-04-28: qty 1

## 2023-04-28 MED ORDER — PHENYLEPHRINE 80 MCG/ML (10ML) SYRINGE FOR IV PUSH (FOR BLOOD PRESSURE SUPPORT)
PREFILLED_SYRINGE | INTRAVENOUS | Status: AC
  Filled 2023-04-28: qty 10

## 2023-04-28 MED ORDER — ROCURONIUM BROMIDE 10 MG/ML (PF) SYRINGE
PREFILLED_SYRINGE | INTRAVENOUS | Status: AC
  Filled 2023-04-28: qty 10

## 2023-04-28 MED ORDER — SODIUM CHLORIDE 0.9% IV SOLUTION: Freq: Once | INTRAVENOUS | Status: AC

## 2023-04-28 MED ORDER — DEXMEDETOMIDINE HCL IN NACL 400 MCG/100ML IV SOLN
INTRAVENOUS | Status: AC
  Filled 2023-04-28: qty 100

## 2023-04-28 MED ORDER — ORAL CARE MOUTH RINSE: 15.0000 mL | OROMUCOSAL | Status: DC | PRN

## 2023-04-28 MED ORDER — SODIUM CHLORIDE 0.9% IV SOLUTION: Freq: Once | INTRAVENOUS | Status: DC

## 2023-04-28 MED ORDER — NOREPINEPHRINE 16 MG/250ML-% IV SOLN
0.0000 ug/min | INTRAVENOUS | Status: DC
  Administered 2023-04-28: 12 ug/min via INTRAVENOUS
  Filled 2023-04-28: qty 250

## 2023-04-28 MED ORDER — PHENYLEPHRINE HCL-NACL 20-0.9 MG/250ML-% IV SOLN
INTRAVENOUS | Status: AC
  Filled 2023-04-28: qty 250

## 2023-04-28 MED ORDER — SODIUM CHLORIDE 0.9 % IV SOLN
INTRAVENOUS | Status: AC
  Filled 2023-04-28: qty 2

## 2023-04-28 MED ORDER — PHENYLEPHRINE HCL (PRESSORS) 10 MG/ML IV SOLN
INTRAVENOUS | Status: DC | PRN
  Administered 2023-04-28: 80 ug via INTRAVENOUS
  Administered 2023-04-28 (×4): 160 ug via INTRAVENOUS
  Administered 2023-04-28: 80 ug via INTRAVENOUS
  Administered 2023-04-28: 160 ug via INTRAVENOUS
  Administered 2023-04-28: 80 ug via INTRAVENOUS
  Administered 2023-04-28 (×3): 160 ug via INTRAVENOUS
  Administered 2023-04-28: 80 ug via INTRAVENOUS
  Administered 2023-04-28 (×4): 160 ug via INTRAVENOUS
  Administered 2023-04-28: 80 ug via INTRAVENOUS
  Administered 2023-04-28: 160 ug via INTRAVENOUS
  Administered 2023-04-28: 80 ug via INTRAVENOUS
  Administered 2023-04-28 (×2): 160 ug via INTRAVENOUS
  Administered 2023-04-28 (×2): 80 ug via INTRAVENOUS
  Administered 2023-04-28 (×2): 160 ug via INTRAVENOUS
  Administered 2023-04-28: 80 ug via INTRAVENOUS
  Administered 2023-04-28: 160 ug via INTRAVENOUS
  Administered 2023-04-28: 80 ug via INTRAVENOUS
  Administered 2023-04-28 (×2): 160 ug via INTRAVENOUS
  Administered 2023-04-28: 80 ug via INTRAVENOUS
  Administered 2023-04-28 (×2): 160 ug via INTRAVENOUS
  Administered 2023-04-28: 80 ug via INTRAVENOUS

## 2023-04-28 MED ORDER — PROPOFOL 10 MG/ML IV BOLUS
INTRAVENOUS | Status: AC
  Filled 2023-04-28: qty 40

## 2023-04-28 MED ORDER — SODIUM CHLORIDE (PF) 0.9 % IJ SOLN
INTRAMUSCULAR | Status: AC
  Filled 2023-04-28: qty 100

## 2023-04-28 MED ORDER — SODIUM CHLORIDE 0.9 % IV SOLN
INTRAVENOUS | Status: DC | PRN
  Administered 2023-04-28 (×2): 2 g via INTRAVENOUS

## 2023-04-28 MED ORDER — PANTOPRAZOLE SODIUM 40 MG IV SOLR
40.0000 mg | INTRAVENOUS | Status: DC
  Administered 2023-04-29 – 2023-05-05 (×7): 40 mg via INTRAVENOUS
  Filled 2023-04-28 (×7): qty 10

## 2023-04-28 MED ORDER — ORAL CARE MOUTH RINSE
15.0000 mL | OROMUCOSAL | Status: DC
  Administered 2023-04-28: 15 mL via OROMUCOSAL

## 2023-04-28 MED ORDER — HYDROMORPHONE HCL 1 MG/ML IJ SOLN
INTRAMUSCULAR | Status: DC | PRN
Start: 2023-04-28 — End: 2023-04-28
  Administered 2023-04-28 (×2): .2 mg via INTRAVENOUS
  Administered 2023-04-28: .3 mg via INTRAVENOUS

## 2023-04-28 MED ORDER — PHENYLEPHRINE HCL-NACL 20-0.9 MG/250ML-% IV SOLN
INTRAVENOUS | Status: DC | PRN
Start: 2023-04-28 — End: 2023-04-28
  Administered 2023-04-28: 40 ug/min via INTRAVENOUS

## 2023-04-28 MED ORDER — PROPOFOL 10 MG/ML IV BOLUS
INTRAVENOUS | Status: DC | PRN
Start: 2023-04-28 — End: 2023-04-28
  Administered 2023-04-28: 100 mg via INTRAVENOUS

## 2023-04-28 MED ORDER — INSULIN ASPART 100 UNIT/ML IJ SOLN
0.0000 [IU] | INTRAMUSCULAR | Status: DC
  Administered 2023-04-28: 15 [IU] via SUBCUTANEOUS
  Filled 2023-04-28: qty 1

## 2023-04-28 MED ORDER — SODIUM CHLORIDE 0.9 % IV SOLN
INTRAVENOUS | Status: DC | PRN
Start: 2023-04-28 — End: 2023-04-28

## 2023-04-28 MED ORDER — FENTANYL CITRATE (PF) 100 MCG/2ML IJ SOLN
INTRAMUSCULAR | Status: DC | PRN
  Administered 2023-04-28 (×2): 25 ug via INTRAVENOUS
  Administered 2023-04-28: 50 ug via INTRAVENOUS

## 2023-04-28 MED ORDER — EPHEDRINE 5 MG/ML INJ
INTRAVENOUS | Status: AC
  Filled 2023-04-28: qty 5

## 2023-04-28 MED ORDER — CALCIUM CHLORIDE 10 % IV SOLN
INTRAVENOUS | Status: DC | PRN
  Administered 2023-04-28: 1 g via INTRAVENOUS

## 2023-04-28 MED ORDER — INSULIN ASPART 100 UNIT/ML IJ SOLN
5.0000 [IU] | Freq: Once | INTRAMUSCULAR | Status: AC
  Administered 2023-04-28: 5 [IU] via SUBCUTANEOUS
  Filled 2023-04-28: qty 1

## 2023-04-28 MED ORDER — ACETAMINOPHEN 10 MG/ML IV SOLN
1000.0000 mg | Freq: Four times a day (QID) | INTRAVENOUS | Status: AC
  Administered 2023-04-28 – 2023-04-29 (×4): 1000 mg via INTRAVENOUS
  Filled 2023-04-28 (×4): qty 100

## 2023-04-28 MED ORDER — SODIUM BICARBONATE 8.4 % IV SOLN
INTRAVENOUS | Status: DC | PRN
  Administered 2023-04-28: 50 meq via INTRAVENOUS

## 2023-04-28 MED ORDER — FUROSEMIDE 10 MG/ML IJ SOLN
INTRAMUSCULAR | Status: DC | PRN
  Administered 2023-04-28: 20 mg via INTRAVENOUS

## 2023-04-28 MED ORDER — FUROSEMIDE 10 MG/ML IJ SOLN
INTRAMUSCULAR | Status: AC
  Filled 2023-04-28: qty 4

## 2023-04-28 MED ORDER — ALBUMIN HUMAN 5 % IV SOLN
INTRAVENOUS | Status: AC
  Filled 2023-04-28: qty 250

## 2023-04-28 MED ORDER — FENTANYL CITRATE (PF) 100 MCG/2ML IJ SOLN
INTRAMUSCULAR | Status: AC
  Filled 2023-04-28: qty 2

## 2023-04-28 MED ORDER — FENTANYL CITRATE PF 50 MCG/ML IJ SOSY
PREFILLED_SYRINGE | INTRAMUSCULAR | Status: AC
  Filled 2023-04-28: qty 1

## 2023-04-28 MED ORDER — SUCCINYLCHOLINE CHLORIDE 200 MG/10ML IV SOSY
PREFILLED_SYRINGE | INTRAVENOUS | Status: AC
  Filled 2023-04-28: qty 10

## 2023-04-28 MED ORDER — DEXAMETHASONE SODIUM PHOSPHATE 10 MG/ML IJ SOLN
INTRAMUSCULAR | Status: DC | PRN
  Administered 2023-04-28: 5 mg via INTRAVENOUS

## 2023-04-28 MED ORDER — DEXMEDETOMIDINE HCL IN NACL 400 MCG/100ML IV SOLN
0.0000 ug/kg/h | INTRAVENOUS | Status: DC
  Administered 2023-04-28: 1 ug/kg/h via INTRAVENOUS
  Administered 2023-04-28: 0.6 ug/kg/h via INTRAVENOUS
  Administered 2023-04-29: 0.8 ug/kg/h via INTRAVENOUS
  Filled 2023-04-28 (×3): qty 100

## 2023-04-28 MED ORDER — SILVER NITRATE-POT NITRATE 75-25 % EX MISC
CUTANEOUS | Status: AC
  Filled 2023-04-28: qty 10

## 2023-04-28 MED ORDER — SODIUM CHLORIDE (PF) 0.9 % IJ SOLN
INTRAMUSCULAR | Status: DC | PRN
  Administered 2023-04-28: 100 mL via SURGICAL_CAVITY

## 2023-04-28 MED ORDER — ONDANSETRON HCL 4 MG/2ML IJ SOLN
INTRAMUSCULAR | Status: AC
  Filled 2023-04-28: qty 2

## 2023-04-28 MED ORDER — HEMOSTATIC AGENTS (NO CHARGE) OPTIME
TOPICAL | Status: DC | PRN
Start: 2023-04-28 — End: 2023-04-28
  Administered 2023-04-28: 1 via TOPICAL

## 2023-04-28 MED ORDER — DEXAMETHASONE SODIUM PHOSPHATE 10 MG/ML IJ SOLN
INTRAMUSCULAR | Status: AC
  Filled 2023-04-28: qty 1

## 2023-04-28 MED ORDER — SEVOFLURANE IN SOLN
RESPIRATORY_TRACT | Status: AC
  Filled 2023-04-28: qty 500

## 2023-04-28 MED ORDER — POLYETHYLENE GLYCOL 3350 17 G PO PACK: 17.0000 g | PACK | Freq: Every day | ORAL | Status: DC

## 2023-04-28 MED ORDER — ROCURONIUM BROMIDE 100 MG/10ML IV SOLN
INTRAVENOUS | Status: DC | PRN
  Administered 2023-04-28: 30 mg via INTRAVENOUS
  Administered 2023-04-28: 50 mg via INTRAVENOUS
  Administered 2023-04-28: 20 mg via INTRAVENOUS
  Administered 2023-04-28 (×3): 30 mg via INTRAVENOUS
  Administered 2023-04-28: 10 mg via INTRAVENOUS
  Administered 2023-04-28: 20 mg via INTRAVENOUS
  Administered 2023-04-28: 70 mg via INTRAVENOUS

## 2023-04-28 MED ORDER — LIDOCAINE HCL (PF) 2 % IJ SOLN
INTRAMUSCULAR | Status: AC
  Filled 2023-04-28: qty 5

## 2023-04-28 MED ORDER — ALBUMIN HUMAN 5 % IV SOLN: INTRAVENOUS | Status: DC | PRN

## 2023-04-28 MED ORDER — SODIUM CHLORIDE 0.9 % IV SOLN
2.0000 g | Freq: Three times a day (TID) | INTRAVENOUS | Status: AC
  Administered 2023-04-28 – 2023-04-29 (×3): 2 g via INTRAVENOUS
  Filled 2023-04-28 (×4): qty 2

## 2023-04-28 MED ORDER — ONDANSETRON HCL 4 MG/2ML IJ SOLN
INTRAMUSCULAR | Status: DC | PRN
  Administered 2023-04-28: 4 mg via INTRAVENOUS

## 2023-04-28 MED ORDER — TRANEXAMIC ACID-NACL 1000-0.7 MG/100ML-% IV SOLN
INTRAVENOUS | Status: AC
  Filled 2023-04-28: qty 100

## 2023-04-28 MED ORDER — DOCUSATE SODIUM 50 MG/5ML PO LIQD: 100.0000 mg | Freq: Two times a day (BID) | ORAL | Status: DC

## 2023-04-28 MED ORDER — MAGNESIUM SULFATE 2 GM/50ML IV SOLN
2.0000 g | Freq: Once | INTRAVENOUS | Status: AC
  Administered 2023-04-28: 2 g via INTRAVENOUS
  Filled 2023-04-28: qty 50

## 2023-04-28 MED ORDER — LACTATED RINGERS IV SOLN: INTRAVENOUS | Status: DC | PRN

## 2023-04-28 MED ORDER — IRRISEPT - 450ML BOTTLE WITH 0.05% CHG IN STERILE WATER, USP 99.95% OPTIME
TOPICAL | Status: DC | PRN
Start: 2023-04-28 — End: 2023-04-28
  Administered 2023-04-28: 450 mL

## 2023-04-28 MED ORDER — SODIUM BICARBONATE 8.4 % IV SOLN
INTRAVENOUS | Status: AC
  Filled 2023-04-28: qty 50

## 2023-04-28 MED ORDER — METHYLENE BLUE (ANTIDOTE) 1 % IV SOLN
INTRAVENOUS | Status: AC
  Filled 2023-04-28: qty 10

## 2023-04-28 MED ORDER — INSULIN ASPART 100 UNIT/ML IJ SOLN
5.0000 [IU] | Freq: Once | INTRAMUSCULAR | Status: AC
  Administered 2023-04-28: 5 [IU] via SUBCUTANEOUS

## 2023-04-28 MED ORDER — KETAMINE HCL 10 MG/ML IJ SOLN
INTRAMUSCULAR | Status: DC | PRN
  Administered 2023-04-28 (×2): 25 mg via INTRAVENOUS

## 2023-04-28 MED ORDER — LIDOCAINE HCL (CARDIAC) PF 100 MG/5ML IV SOSY
PREFILLED_SYRINGE | INTRAVENOUS | Status: DC | PRN
  Administered 2023-04-28: 100 mg via INTRAVENOUS

## 2023-04-28 MED ORDER — MIDAZOLAM HCL 2 MG/2ML IJ SOLN
INTRAMUSCULAR | Status: DC | PRN
  Administered 2023-04-28: 1 mg via INTRAVENOUS
  Administered 2023-04-28: 2 mg via INTRAVENOUS
  Administered 2023-04-28: 1 mg via INTRAVENOUS

## 2023-04-28 MED ORDER — KETAMINE HCL 50 MG/5ML IJ SOSY
PREFILLED_SYRINGE | INTRAMUSCULAR | Status: AC
  Filled 2023-04-28: qty 5

## 2023-04-28 MED ORDER — ORAL CARE MOUTH RINSE
15.0000 mL | OROMUCOSAL | Status: DC
  Administered 2023-04-28 – 2023-04-29 (×9): 15 mL via OROMUCOSAL

## 2023-04-28 MED ORDER — FENTANYL CITRATE PF 50 MCG/ML IJ SOSY
50.0000 ug | PREFILLED_SYRINGE | INTRAMUSCULAR | Status: DC | PRN
  Administered 2023-04-29 (×2): 50 ug via INTRAVENOUS
  Administered 2023-04-29: 100 ug via INTRAVENOUS
  Filled 2023-04-28 (×4): qty 1

## 2023-04-28 MED ORDER — SUGAMMADEX SODIUM 200 MG/2ML IV SOLN
INTRAVENOUS | Status: DC | PRN
  Administered 2023-04-28: 200 mg via INTRAVENOUS

## 2023-04-28 MED ORDER — GLYCOPYRROLATE 0.2 MG/ML IJ SOLN
INTRAMUSCULAR | Status: AC
  Filled 2023-04-28: qty 1

## 2023-04-28 MED ORDER — ZINC CHLORIDE 1 MG/ML IV SOLN
INTRAVENOUS | Status: AC
  Filled 2023-04-28: qty 770

## 2023-04-28 MED ORDER — NOREPINEPHRINE 4 MG/250ML-% IV SOLN
INTRAVENOUS | Status: DC | PRN
  Administered 2023-04-28: 1 ug/min via INTRAVENOUS

## 2023-04-28 MED ORDER — SODIUM BICARBONATE 8.4 % IV SOLN
50.0000 meq | Freq: Once | INTRAVENOUS | Status: AC
  Administered 2023-04-28: 50 meq via INTRAVENOUS

## 2023-04-28 MED ORDER — SEPRAFILM FOR OPTIME
ORAL_FILM | TOPICAL | Status: DC | PRN
  Administered 2023-04-28: 6 via TOPICAL

## 2023-04-28 MED ORDER — TRANEXAMIC ACID 1000 MG/10ML IV SOLN
INTRAVENOUS | Status: DC | PRN
  Administered 2023-04-28: 1000 mg via INTRAVENOUS

## 2023-04-28 MED ORDER — INSULIN ASPART 100 UNIT/ML IJ SOLN
0.0000 [IU] | INTRAMUSCULAR | Status: DC
  Administered 2023-04-28: 8 [IU] via SUBCUTANEOUS
  Filled 2023-04-28: qty 1

## 2023-04-28 MED ORDER — FENTANYL CITRATE PF 50 MCG/ML IJ SOSY
50.0000 ug | PREFILLED_SYRINGE | INTRAMUSCULAR | Status: AC | PRN
  Administered 2023-04-28 – 2023-04-29 (×3): 50 ug via INTRAVENOUS
  Filled 2023-04-28 (×2): qty 1

## 2023-04-28 MED ORDER — 0.9 % SODIUM CHLORIDE (POUR BTL) OPTIME
TOPICAL | Status: DC | PRN
  Administered 2023-04-28: 500 mL

## 2023-04-28 SURGICAL SUPPLY — 84 items
APL PRP STRL LF DISP 70% ISPRP (MISCELLANEOUS)
APPLIER CLIP 11 MED OPEN (CLIP)
APPLIER CLIP 13 LRG OPEN (CLIP)
APR CLP LRG 13 20 CLIP (CLIP)
APR CLP MED 11 20 MLT OPN (CLIP)
BARRIER ADH SEPRAFILM 3INX5IN (MISCELLANEOUS) IMPLANT
BLADE CLIPPER SURG (BLADE) ×1 IMPLANT
BRR ADH 5X3 SEPRAFILM 2 SHT (MISCELLANEOUS) ×3
BRR ADH 6X5 SEPRAFILM 1 SHT (MISCELLANEOUS)
BULB RESERV EVAC DRAIN JP 100C (MISCELLANEOUS) IMPLANT
CANISTER WOUND CARE 500ML ATS (WOUND CARE) IMPLANT
CHLORAPREP W/TINT 26 (MISCELLANEOUS) ×1 IMPLANT
CLIP APPLIE 11 MED OPEN (CLIP) IMPLANT
CLIP APPLIE 13 LRG OPEN (CLIP) IMPLANT
COVER LIGHT HANDLE STERIS (MISCELLANEOUS) IMPLANT
DRAIN CHANNEL JP 19F (MISCELLANEOUS) IMPLANT
DRAPE LAPAROTOMY 100X77 ABD (DRAPES) ×1 IMPLANT
DRAPE TABLE BACK 80X90 (DRAPES) IMPLANT
DRSG OPSITE POSTOP 4X10 (GAUZE/BANDAGES/DRESSINGS) IMPLANT
DRSG OPSITE POSTOP 4X12 (GAUZE/BANDAGES/DRESSINGS) IMPLANT
DRSG TEGADERM 2-3/8X2-3/4 SM (GAUZE/BANDAGES/DRESSINGS) IMPLANT
DRSG TEGADERM 4X4.75 (GAUZE/BANDAGES/DRESSINGS) IMPLANT
DRSG VERSA FOAM LRG 10X15 (GAUZE/BANDAGES/DRESSINGS) IMPLANT
ELECT BLADE 6.5 EXT (BLADE) ×1 IMPLANT
ELECT CAUTERY BLADE 6.4 (BLADE) ×1 IMPLANT
ELECT REM PT RETURN 9FT ADLT (ELECTROSURGICAL) ×1
ELECTRODE REM PT RTRN 9FT ADLT (ELECTROSURGICAL) ×1 IMPLANT
GAUZE 4X4 16PLY ~~LOC~~+RFID DBL (SPONGE) ×1 IMPLANT
GLOVE BIO SURGEON STRL SZ7 (GLOVE) ×2 IMPLANT
GLOVE SURG SYN 7.0 (GLOVE) ×1 IMPLANT
GLOVE SURG SYN 7.0 PF PI (GLOVE) IMPLANT
GOWN STRL REUS W/ TWL LRG LVL3 (GOWN DISPOSABLE) ×3 IMPLANT
GOWN STRL REUS W/TWL LRG LVL3 (GOWN DISPOSABLE) ×3
GRASPER SUT TROCAR 14GX15 (MISCELLANEOUS) IMPLANT
HANDLE YANKAUER SUCT BULB TIP (MISCELLANEOUS) IMPLANT
HEMOSTAT ARISTA ABSORB 3G PWDR (HEMOSTASIS) IMPLANT
JET LAVAGE IRRISEPT WOUND (IRRIGATION / IRRIGATOR) ×1
KIT DRSG VAC SLVR GRANUFM (MISCELLANEOUS) IMPLANT
KIT TURNOVER KIT A (KITS) ×1 IMPLANT
LAVAGE JET IRRISEPT WOUND (IRRIGATION / IRRIGATOR) IMPLANT
LIGASURE IMPACT 36 18CM CVD LR (INSTRUMENTS) IMPLANT
MANIFOLD NEPTUNE II (INSTRUMENTS) ×1 IMPLANT
MESH PHASIX 12X14 POSITON DEVC (Mesh General) IMPLANT
MESH PHASIX RESORBABLE 14X14 (Mesh General) IMPLANT
NDL HYPO 22X1.5 SAFETY MO (MISCELLANEOUS) ×2 IMPLANT
NEEDLE HYPO 22X1.5 SAFETY MO (MISCELLANEOUS) ×2 IMPLANT
NS IRRIG 1000ML POUR BTL (IV SOLUTION) ×1 IMPLANT
OPTIFIX OPEN ABSORBABLE FIXATION SYSTEM IMPLANT
PACK BASIN MAJOR ARMC (MISCELLANEOUS) ×1 IMPLANT
PREVENA INCISION MGT 90 150 (MISCELLANEOUS) IMPLANT
RELOAD PROXIMATE 75MM BLUE (ENDOMECHANICALS) ×2 IMPLANT
RELOAD PROXIMATE 75MM GREEN (ENDOMECHANICALS) ×2 IMPLANT
RELOAD STAPLE 75 3.8 BLU REG (ENDOMECHANICALS) IMPLANT
RELOAD STAPLE 75 4.5 GRN THCK (ENDOMECHANICALS) IMPLANT
SEPRAFILM MEMBRANE 5X6 (MISCELLANEOUS) IMPLANT
SLEEVE PROTECTION STRL DISP (MISCELLANEOUS) IMPLANT
SPONGE DRAIN TRACH 4X4 STRL 2S (GAUZE/BANDAGES/DRESSINGS) IMPLANT
SPONGE KITTNER 5P (MISCELLANEOUS) ×1 IMPLANT
SPONGE T-LAP 18X18 ~~LOC~~+RFID (SPONGE) ×2 IMPLANT
SPONGE T-LAP 18X36 ~~LOC~~+RFID STR (SPONGE) IMPLANT
STAPLER PROXIMATE 75MM BLUE (STAPLE) IMPLANT
STAPLER SKIN PROX 35W (STAPLE) ×1 IMPLANT
SUT ETHILON 2 0 FS 18 (SUTURE) IMPLANT
SUT ETHILON 3-0 FS-10 30 BLK (SUTURE) ×2
SUT PDS AB 0 CT1 27 (SUTURE) ×3 IMPLANT
SUT SILK 2 0 (SUTURE) ×1
SUT SILK 2 0 SH CR/8 (SUTURE) ×1 IMPLANT
SUT SILK 2 0SH CR/8 30 (SUTURE) ×1 IMPLANT
SUT SILK 2-0 18XBRD TIE 12 (SUTURE) ×1 IMPLANT
SUT VIC AB 0 CT1 36 (SUTURE) ×2 IMPLANT
SUT VIC AB 2-0 SH 27 (SUTURE) ×1
SUT VIC AB 2-0 SH 27XBRD (SUTURE) IMPLANT
SUT VIC AB 3-0 SH 27 (SUTURE)
SUT VIC AB 3-0 SH 27X BRD (SUTURE) ×1 IMPLANT
SUTURE EHLN 3-0 FS-10 30 BLK (SUTURE) IMPLANT
SYR 20ML LL LF (SYRINGE) ×2 IMPLANT
SYR 3ML LL SCALE MARK (SYRINGE) ×1 IMPLANT
SYR 50ML LL SCALE MARK (SYRINGE) IMPLANT
SYR 50ML SLIP (SYRINGE) IMPLANT
TACKER HERNIA 20 ABSORBABLE (ENDOMECHANICALS) IMPLANT
TRAP FLUID SMOKE EVACUATOR (MISCELLANEOUS) ×1 IMPLANT
TRAY FOLEY MTR SLVR 16FR STAT (SET/KITS/TRAYS/PACK) ×1 IMPLANT
TRAY FOLEY SLVR 16FR LF STAT (SET/KITS/TRAYS/PACK) ×1 IMPLANT
WATER STERILE IRR 500ML POUR (IV SOLUTION) ×1 IMPLANT

## 2023-04-28 NOTE — Consult Note (Incomplete)
NAME:  Debra [name], MRN:  [id number], DOB:  April 05, 1966, LOS: 439 ADMISSION DATE:  02/13/2022  CHIEF COMPLAINT:  post op resp failure inability to proptect airway  BRIEF SYNOPSIS PROLONGED COMPLICATED ABD SURGERY WITH ENTEROCUTANEOUS FISTULA  HAS BEEN IN THE HOSPITAL FOR 1 YEAR POST OP RESP FAILURE   History of Present Illness:  57 year old female with a history of significant cancer status post sigmoid colectomy 2 years ago now with recurrent disease within the anterior pelvis next to the anastomosis complicated by high output enterocutaneous fistula 129 Days Post-Op placement of right IJ port-a-catheter with dual reservoirs also s/p re-opening of laparotomy for repair of small bowel perforation following initial laparotomy, excision of greater omental mass, abdominal wall reconstruction appendectomy  Significant Hospital Events: Including procedures, antibiotic start and stop dates in addition to other pertinent events   03/07/2022-04/28/2023-post op abd complications with high output ECF 8/20 admitted to ICU for post op resp failure and inability to protect airway due to prolonged complicated abd surgery   Micro Data:   Antibiotics Given (last 72 hours)     None       Objective   Blood pressure (!) 100/49, pulse 78, temperature 98 F (36.7 C), temperature source Temporal, resp. rate 17, height 4\' 11"  (1.499 m), weight 106.6 kg, SpO2 98%.        Intake/Output Summary (Last 24 hours) at 04/28/2023 1814 Last data filed at 04/28/2023 1813 Gross per 24 hour  Intake 7173 ml  Output 1995 ml  Net 5178 ml   Filed Weights   04/26/23 0500 04/27/23 0500 04/28/23 0708  Weight: 108 kg 108 kg 106.6 kg     REVIEW OF SYSTEMS  PATIENT IS UNABLE TO PROVIDE COMPLETE REVIEW OF SYSTEMS DUE TO SEVERE CRITICAL ILLNESS   PHYSICAL EXAMINATION:  GENERAL:critically ill appearing, +resp distress EYES: Pupils equal, round, reactive to light.  No scleral icterus.  MOUTH: Moist mucosal  membrane. INTUBATED NECK: Supple.  PULMONARY: Lungs clear to auscultation, +rhonchi, +wheezing CARDIOVASCULAR: S1 and S2.  Regular rate and rhythm GASTROINTESTINAL: Soft, nontender, -distended. Positive bowel sounds.  MUSCULOSKELETAL: No swelling, clubbing, or edema.  NEUROLOGIC: obtunded,sedated SKIN:normal, warm to touch, Capillary refill delayed  Pulses present bilaterally   Labs/imaging that I havepersonally reviewed  (right click and "Reselect all SmartList Selections" daily)     ASSESSMENT AND PLAN SYNOPSIS  Severe ACUTE Hypoxic and Hypercapnic Respiratory Failure -continue Mechanical Ventilator support -continue Bronchodilator Therapy -Wean Fio2 and PEEP as tolerated -VAP/VENT bundle implementation -will perform SAT/SBT when respiratory parameters are met     SEVERE COPD EXACERBATION -continue IV steroids as prescribed -continue NEB THERAPY as prescribed -morphine as needed -wean fio2 as needed and tolerated   SEVERE ALCOHOL WITHDRAWAL -Therapy with Thiamine and MVI -CIWA Protocol -Precedex as needed -High risk for intubation  -high risk for aspiration   CARDIAC FAILURE-acute combined systolic/diastolic dysfunction -oxygen as needed -Lasix as tolerated -follow up cardiac enzymes as indicated   CARDIAC ICU monitoring   ACUTE KIDNEY INJURY/Renal Failure -continue Foley Catheter-assess need -Avoid nephrotoxic agents -Follow urine output, BMP -Ensure adequate renal perfusion, optimize oxygenation -Renal dose medications   Intake/Output Summary (Last 24 hours) at 04/28/2023 1814 Last data filed at 04/28/2023 1813 Gross per 24 hour  Intake 7173 ml  Output 1995 ml  Net 5178 ml     NEUROLOGY Acute  metabolic encephalopathy, need for sedation Goal RASS -2 to -3   SEPTIC SHOCK SOURCE- -use vasopressors to keep MAP>65 as needed -follow ABG and  LA -follow up cultures -emperic ABX -consider stress dose steroids -aggressive IV fluid  resuscitation  INFECTIOUS DISEASE -continue antibiotics as prescribed -follow up cultures -follow up ID consultation  ENDO - ICU hypoglycemic\Hyperglycemia protocol -check FSBS per protocol   GI GI PROPHYLAXIS as indicated  NUTRITIONAL STATUS DIET-->TF's as tolerated Constipation protocol as indicated   ELECTROLYTES -follow labs as needed -replace as needed -pharmacy consultation and following   ACUTE ANEMIA- TRANSFUSE AS NEEDED CONSIDER TRANSFUSION  IF HGB<7 DVT PRX with TED/SCD's ONLY     Best practice (right click and "Reselect all SmartList Selections" daily)  Diet: NPO Pain/Anxiety/Delirium protocol (if indicated): {Pain/Anxiety/Delirium:26941} VAP protocol (if indicated): {VAP:29640} DVT prophylaxis: {DVT Prophylaxis:26933} GI prophylaxis: {GI:26934} Glucose control:  {Glucose Control:26935} Central venous access:  {Central Venous Access:26936} Arterial line:  {Central Venous Access:26936} Foley:  {Central Venous Access:26936} Mobility:  {Mobility:26937}  Code Status:  FULL Disposition:ICU  Labs   CBC: Recent Labs  Lab 04/27/23 0605 04/28/23 1128 04/28/23 1606  WBC 2.4* 6.1  --   HGB 8.6* 8.3* 8.7*  HCT 27.0* 25.7* 25.9*  MCV 96.1 92.8  --   PLT 60* 75*  --     Basic Metabolic Panel: Recent Labs  Lab 04/27/23 0605 04/28/23 1128  NA 138 136  K 3.7 4.2  CL 106 107  CO2 28 23  GLUCOSE 185* 235*  BUN 22* 22*  CREATININE 0.54 0.54  CALCIUM 8.0* 10.0  MG 2.1  --   PHOS 2.9  --    GFR: Estimated Creatinine Clearance: 82 mL/min (by C-G formula based on SCr of 0.54 mg/dL). Recent Labs  Lab 04/27/23 0605 04/28/23 1128  WBC 2.4* 6.1    Liver Function Tests: Recent Labs  Lab 04/27/23 0605 04/28/23 1128  AST 45* 55*  ALT 32 33  ALKPHOS 170* 141*  BILITOT 0.6 1.4*  PROT 5.8* 5.1*  ALBUMIN 2.3* 2.1*   No results for input(s): "LIPASE", "AMYLASE" in the last 168 hours. No results for input(s): "AMMONIA" in the last 168  hours.  ABG    Component Value Date/Time   PHART 7.29 (L) 04/28/2023 1555   PCO2ART 44 04/28/2023 1555   PO2ART 158 (H) 04/28/2023 1555   HCO3 21.2 04/28/2023 1555   ACIDBASEDEF 5.2 (H) 04/28/2023 1555   O2SAT 99.7 04/28/2023 1555     Coagulation Profile: Recent Labs  Lab 04/27/23 0605 04/28/23 1128  INR 1.5* 1.4*    Cardiac Enzymes: No results for input(s): "CKTOTAL", "CKMB", "CKMBINDEX", "TROPONINI" in the last 168 hours.  HbA1C: Hgb A1c MFr Bld  Date/Time Value Ref Range Status  04/02/2023 08:56 AM 6.8 (H) 4.8 - 5.6 % Final    Comment:    (NOTE) Pre diabetes:          5.7%-6.4%  Diabetes:              >6.4%  Glycemic control for   <7.0% adults with diabetes     CBG: Recent Labs  Lab 04/27/23 1848 04/27/23 2233 04/28/23 0527 04/28/23 0701 04/28/23 0913  GLUCAP 110* 123* 196* 171* 163*     Past Medical History:  She,  has a past medical history of Anemia, Colon cancer (HCC) (04/2020), History of kidney stones, Hypothyroidism, and Thyroid disease.   Surgical History:   Past Surgical History:  Procedure Laterality Date   ABDOMINAL WALL DEFECT REPAIR N/A 02/13/2022   Procedure: REPAIR ABDOMINAL WALL;  Surgeon: Leafy Ro, MD;  Location: [facility] ORS;  Service: General;  Laterality: N/A;  APPENDECTOMY N/A 02/13/2022   Procedure: APPENDECTOMY;  Surgeon: Leafy Ro, MD;  Location: [facility];  Service: General;  Laterality: N/A;   APPLICATION OF WOUND VAC N/A 02/15/2022   Procedure: APPLICATION OF WOUND VAC;  Surgeon: Leafy Ro, MD;  Location: [facility];  Service: General;  Laterality: N/A;  [id number]   COLON RESECTION SIGMOID N/A 02/13/2022   Procedure: COLON RESECTION SIGMOID, open, [name], PA-C to assist;  Surgeon: Leafy Ro, MD;  Location: [facility];  Service: General;  Laterality: N/A;   COLONOSCOPY WITH PROPOFOL N/A 11/21/2019   Procedure: COLONOSCOPY WITH PROPOFOL;  Surgeon: Toney Reil, MD;  Location[facility] ENDOSCOPY;  Service:  Gastroenterology;  Laterality: N/A;   COLONOSCOPY WITH PROPOFOL N/A 07/10/2021   Procedure: COLONOSCOPY WITH PROPOFOL;  Surgeon: Toney Reil, MD;  Location: [facility] ENDOSCOPY;  Service: Gastroenterology;  Laterality: N/A;   COLONOSCOPY WITH PROPOFOL N/A 02/06/2022   Procedure: COLONOSCOPY WITH PROPOFOL;  Surgeon: Toney Reil, MD;  Location: [facility] ENDOSCOPY;  Service: Gastroenterology;  Laterality: N/A;  SPANISH   CYSTOSCOPY WITH STENT PLACEMENT Left 01/30/2020   Procedure: CYSTOSCOPY WITH STENT PLACEMENT;  Surgeon: Vanna Scotland, MD;  Location: [facility];  Service: Urology;  Laterality: Left;   ESOPHAGOGASTRODUODENOSCOPY (EGD) WITH PROPOFOL N/A 11/21/2019   Procedure: ESOPHAGOGASTRODUODENOSCOPY (EGD) WITH PROPOFOL;  Surgeon: Toney Reil, MD;  Location[facility];  Service: Gastroenterology;  Laterality: N/A;   INSERTION OF MESH N/A 02/13/2022   Procedure: INSERTION OF MESH;  Surgeon: Leafy Ro, MD;  Location: [facility];  Service: General;  Laterality: N/A;   INSERTION OF MESH N/A 02/15/2022   Procedure: INSERTION OF MESH;  Surgeon: Leafy Ro, MD;  Location: [facility];  Service: General;  Laterality: N/A;   IR RADIOLOGIST EVAL & MGMT  07/03/2020   IR RADIOLOGIST EVAL & MGMT  07/12/2020   LAPAROTOMY N/A 02/15/2022   Procedure: EXPLORATORY LAPAROTOMY REPAIR OF BOWEL PERFORATION;  Surgeon: Leafy Ro, MD;  Location: [facility];  Service: General;  Laterality: N/A;   PARTIAL COLECTOMY N/A 01/30/2020   Procedure: PARTIAL COLECTOMY Sigmoid;  Surgeon: Duanne Guess, MD;  Location: [facility];  Service: General;  Laterality: N/A;   PORTACATH PLACEMENT Left 03/28/2020   Procedure: INSERTION PORT-A-CATH;  Surgeon: Duanne Guess, MD;  Location: [facility];  Service: General;  Laterality: Left;   PORTACATH PLACEMENT N/A 08/29/2020   Procedure: INSERTION PORT-A-CATH, chemotherapy;  Surgeon: Duanne Guess, MD;  Location: [facility];  Service: General;  Laterality: N/A;   PORTACATH  PLACEMENT N/A 12/19/2022   Procedure: INSERTION PORT-A-CATH;  Surgeon: Leafy Ro, MD;  Location: [facility];  Service: General;  Laterality: N/A;   VENTRAL HERNIA REPAIR N/A 02/13/2022   Procedure: HERNIA REPAIR VENTRAL ADULT, open, [name], PA-C to assist;  Surgeon: Leafy Ro, MD;  Location: [facility] OR[facility];  Service: General;  Laterality: N/A;     Social History:   reports that she has never smoked. She has never used smokeless tobacco. She reports that she does not drink alcohol and does not use drugs.   Family History:  Her family history includes Diabetes in her sister.   Allergies No Known Allergies   Home Medications  Prior to Admission medications   Medication Sig Start Date End Date Taking? Authorizing Provider  metroNIDAZOLE (FLAGYL) 500 MG tablet Take 2 tabs (1000 mg total) at 8 am, again at 2 pm, and again at 8 pm the day prior to surgery. 02/10/22  Yes Pabon, Merri Ray, MD  neomycin (MYCIFRADIN) 500 MG tablet Take 2 tabs (1000 mg total) at 8 am, again at 2 pm, and again at 8 pm the day prior to surgery. 02/10/22  Yes Pabon, Diego F, MD  polyethylene glycol powder (MIRALAX) 17 GM/SCOOP powder Mix with 64 ounces of Gatorade (no red) the day prior to surgery and drink as directed. 02/10/22  Yes Pabon, Diego F, MD  docusate sodium (COLACE) 100 MG capsule Take 1 capsule (100 mg total) by mouth daily. 05/18/20   Rickard Patience, MD  levothyroxine (SYNTHROID) 137 MCG tablet Take 137 mcg by mouth daily before breakfast.     [provider]  polyethylene glycol (MIRALAX) 17 g packet Take 17 g by mouth daily as needed. 10/29/20   Rickard Patience, MD  prochlorperazine (COMPAZINE) 10 MG tablet Take 1 tablet (10 mg total) by mouth every 6 (six) hours as needed (Nausea or vomiting). 03/08/20 02/22/22  Rickard Patience, MD       DVT/GI PRX  assessed I Assessed the need for Labs I Assessed the need for Foley I Assessed the need for Central Venous Line Family Discussion when available I Assessed the need  for Mobilization I made an Assessment of medications to be adjusted accordingly Safety Risk assessment completed       Critical Care Time devoted to patient care services described in this note is *** minutes.   Critical care was necessary to treat /prevent imminent and life-threatening deterioration.   PATIENT WITH VERY POOR PROGNOSIS I ANTICIPATE PROLONGED ICU LOS  Patient is critically ill. Patient with Multiorgan failure and at high risk for cardiac arrest and death.

## 2023-04-28 NOTE — Anesthesia Procedure Notes (Signed)
Procedure Name: Intubation Date/Time: 04/28/2023 7:59 AM  Performed by: Lily Lovings, CRNAPre-anesthesia Checklist: Patient identified, Patient being monitored, Timeout performed, Emergency Drugs available and Suction available Patient Re-evaluated:Patient Re-evaluated prior to induction Oxygen Delivery Method: Circle system utilized Preoxygenation: Pre-oxygenation with 100% oxygen Induction Type: IV induction Ventilation: Oral airway inserted - appropriate to patient size and Mask ventilation without difficulty Laryngoscope Size: 3 and McGraph Grade View: Grade I Tube type: Oral Tube size: 7.0 mm Number of attempts: 1 Airway Equipment and Method: Stylet Placement Confirmation: ETT inserted through vocal cords under direct vision, positive ETCO2 and breath sounds checked- equal and bilateral Secured at: 21 cm Tube secured with: Tape Dental Injury: Teeth and Oropharynx as per pre-operative assessment  Comments: Lauren Cozart, SRNA placed ETT under direct supervision. MDA and CRNA at bedside.

## 2023-04-28 NOTE — Op Note (Signed)
PROCEDURES: 1. Abdominal wall reconstruction with bilateral myocutaneous flaps using a Anterior fascial release and a One 35x30 cms Phasix ST mesh underlay fashion and another 35x35 cm phasix on lay fashion 2. Recurrent Incisional hernia repair chronically incarcerated w Fistula measuring 14cm 3. Partial distal  gastrectomy 4. Small bowel resection 80 cm piece including three EC fistulas 5, Extensive LOA 6. Repair enterotomy x 3 due to extensive adhesion and prior mesh fused to the bowel. 7. Wound vac Placement 34 x 2 cm ( placed two white sponged on top of mesh and silver spopnge)  8.  Excisional Debridement of abdominal wall skin, fascia 14x 4 cms 9. 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 with endotracheal tube  Assistant: Dr. Aleen Campi ( essential due to the complexity of the procedure and to have adequate exposure) Instrumental for this complex case that lasted 12 hrs   Findings: EC fistula all coming from a single long segment of Jejunum No evidence of anastomotic leak or  leak from wedge gastrectomy Large ventral Defect with loss of domain, unable to completely approximate fascia to midline.  Phasix ST used as underlay and another piece of Phasix used as on lay [place] coverage of defect with reconstruction of  the abd wall Very raw surfaces and significant clinical coagulopathy w oozing  Total Operative time 12 hrs  Estimated Blood Loss: 1500 cc         Drains: 19 FR x 2 , Right Intra-abdominal . Left abdominal wall             Complications: none        Condition: stable  Procedure Details  The patient was seen again in the Holding Room. The benefits, complications, treatment options, and expected outcomes  were discussed with the patient. The risks of bleeding, infection, recurrence of symptoms, failure to resolve symptoms,  bowel injury, any of which could require further surgery were reviewed with the patient.   The patient was taken to Operating Room, identified and the procedure verified.  A Time Out was held and the above information confirmed.  Generous elliptical incision was created from xiphoid  to above the umbilicus. We took our time to safely enter the abdominal cavity. THis took at least 45 minutes . We entered the abd cavity w/o injuries.   The subcutaneous tissue is divided and the fascia and the hernia sac are identified. The peritoneal cavity was entered.  Extensive adhesions were performed using a combination of Metzenbaum scissors and knife. LOA was extensive . We were finally able to restore the anatomy and assess the abdominal cavity and the fistulas. There were three fistula all coming from the distal jejunum in a segment encompassing 80 cms. There was significant adhesions of the small bowel to the abdominal wall and the omentum to the abdominal wall.   There was remnant of mesh that was adhered to the bowel. There was also a piece of prior mesh that was attached to the anterior wall of the stomach, we performed a distal gastrectomy, by elevating the stomach and performing a wedge resection with multiple green GIA 75 mm loads.  We were able to free all the bowel. There were three enterotomies that were appropriately repaired, enterotomies were the result of  mesh being fused to the bowel surfaces. We repair the enterotomies with inturrupted silks.  After delineating the fistulas a bowel resection was performed incorporating all three fistula segments. 75 mm blue load stapler used proximally and distally and side to side functional end to end anastomosis done with 75 mm stapler, common channel closed with multiple silks in interrupted fashion. I asked anesthesia to place methylene blue  via NG, no evidence of leaks from the stomach or from the bowel was seen. We also developed a space within the retzsius space and retro xiphoid.  Note that there was old mesh in the inferior portion and we needed to complete an excisional debridement of the abdominal wall down to the fascia and excise the old mesh.  Using liposomal Marcaine TAP Block was performed on each side under direct visualization. I realized that in order to gain some flexibility of the abdominal wall I needed to for a fascial release. Anterior external oblique was released after creating subcutaneous flaps bilaterally, the fascia was scored with cautery,. This Gave [name] some release but was unable to completely close the midline fascia.   I Selected a 35 x 30 cm Phasix St mesh and in placed it intra peritoneum as underlay fashion.    Mesh was Fix  circumferentially with interrupted 0 PDS trans fascial sutures.  The mesh lay very flat .  I was very happy with the repair but there was a segment of 12 to 6 cm that were were unable to fully approximate on the midline. Given the gap I decided to place an additional phasix uncoated mesh in an overlay fashion, it was fixed circumferentially with multiple 0 PDs and absorbable tacker. Marland Kitchen a 19 Fr blake was placed intra-abdominally with exit site to the Right.   A 19 [place] Blake drain was placed in the abd wall space in the standard fashion w exit site on the left side .   Attention Was turned to the anterior sheath.  This was approximated with multiple running 0 PDS sutures.  We made sure that we released some of the tension that was attached to the subcutaneous tissue.  The wound was irrigated with saline and sterile water. Skin was partially approximated inferiorly and superiolryl w 2-0 nylon/ . I placed two white sponges on top of the mesh and a silver sponge on top. 75 mm suction. No leaks.   Needle and laparotomy count were correct and there were no immediate complications  Dr. Aleen Campi  was essential due to the complexity of the procedure and to have adequate exposure, assisted with anastomosis , hernia repair and was  Instrumental for this complex case that lasted 12 hrs . Please also note that Mr Manus Rudd [facility] Scrubbed in during a portion of the case to relieve Dr Aleen Campi and my self me for about 60 minutes or so . ( So we each had a bathroom and snack break)  Sterling Big, MD, FACS

## 2023-04-28 NOTE — Progress Notes (Signed)
Preoperative Review   Patient is met in the preoperative holding area. The history is reviewed in the chart and with the patient. I personally reviewed the options and rationale as well as the risks of this procedure that have been previously discussed with the patient. All questions asked by the patient and/or family were answered to their satisfaction. I did explain specifically to the daughter in detail about my thought process and the complexity of her disease/ She understands  Patient agrees to proceed with this procedure at this time.  Sterling Big M.D. FACS

## 2023-04-28 NOTE — Consult Note (Cosign Needed Addendum)
NAME:  Debra [name], MRN:  [id number], DOB:  Oct 14, 1965, LOS: 439 ADMISSION DATE:  02/13/2022, CONSULTATION DATE:  04/28/23 REFERRING MD:  Dr. 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  Hospital course: Prolonged stay see significant Hospital events  04/28/2023 most recent significant labs: (Labs/ Imaging personally reviewed) Recent labs significant for hyperglycemia, mild transaminitis, hypoalbuminemia and a mildly elevated BUN as well as mild anemia, thrombocytopenia Chemistry: Na+:136, K+: 4.2, BUN/Cr.: 22/0.54, Serum CO2/ AG: 23/6, Alk phos: 141, Albumin: 2.1, AST/ALT: 55/33, T.Bili: 1.4, glucose: 235 Hematology: WBC: 6.1, Hgb: 8.3 > 8.7, plt: 75 INR: 1.4,  Parainfluenza +: 04/04/23  ABG: 7.29/ 44/ 158/ 21.2 CXR 04/04/23: low lung volumes with stable small L pleural effusion CT chest/ abdomen/ pelvis w contrast 04/01/23: Large anterior abdominal wound or defect with oral contrast pooling along the external surface. 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 Small left pleural effusion with compressive atelectasis in the left lower lobe. Hazy parenchymal densities in both lungs are similar to the previous examination and nonspecific. Findings could be associated with atelectasis or post inflammatory changes. Small ventral hernias along the left side of the abdominal wound/defect. There is a loop of decompressed  small bowel associated with these small ventral hernias. Small amount of fluid in the abdomen is new. There is small focal collection adjacent to the proximal stomach that measures up to 2.3 cm. Chronic vertebral body compression fracture involving L1.  PCCM consulted for assistance in management and monitoring due to post-operative respiratory failure requiring continued mechanical ventilatory support in the setting of partial gastrectomy, hernia repair, small bowel resection, repair entertomy and abdominal wall reconstruction.  Pertinent  Medical History  Colon Cancer Anemia Hypothyroidism  Significant Hospital Events: Including procedures, antibiotic start and stop dates in addition to other pertinent events   02/13/22: initial laparotomy, excision of greater omental mass, abdominal wall reconstruction with [name], appendectomy, and placement of Prevena vac  02/15/22: EXPLORATORY LAPAROTOMY REPAIR OF BOWEL PERFORATION, INSERTION OF MESH, APPLICATION OF WOUND VAC 02/26/22:  COLON RESECTION SIGMOID, HERNIA REPAIR VENTRAL, APPENDECTOMY, INSERTION OF MESH, REPAIR ABDOMINAL WALL 07/15/22: Neuro surgery consultation due to L1 compression fracture. Not a candidate for lumbosacral orthotic. 12/19/22: Insertion of Port-a-cath 04/06/23: removal of port-a-cath Throughout stay patient has required TPN, WOC & ID consultation. 04/28/23: 12 hours and surgery for abdominal wall reconstruction, recurrent incisional hernia repair, partial gastrectomy, small bowel resection, repair enterotomy x 3 and wound VAC placement.  Patient extubated postop but too lethargic to maintain airway requiring reintubation and transferred to ICU for postoperative mechanical ventilatory management.  Interim History / Subjective:  Patient intubated and sedated unable to participate in interview at this time.  Discussed plan of care with the patient's 2 daughters and boyfriend, all questions and concerns answered at this  time.  Objective   Blood pressure (!) 100/49, pulse 78, temperature 98 F (36.7 C), temperature source Temporal, resp. rate 17, height 4\' 11"  (1.499 m), weight 106.6 kg, SpO2 98%.  Intake/Output Summary (Last 24 hours) at 04/28/2023 1926 Last data filed at 04/28/2023 1846 Gross per 24 hour  Intake 8193 ml  Output 2050 ml  Net 6143 ml   Filed Weights   04/26/23 0500 04/27/23 0500 04/28/23 0708  Weight: 108 kg 108 kg 106.6 kg    Examination: General: Adult female, critically ill, lying in bed intubated & sedated requiring mechanical ventilation, NAD HEENT: MM pink/moist, anicteric, atraumatic, neck supple Neuro: Sedated, RASS -4 (immediately postop), unable to follow commands, PERRL +3-sluggish CV: s1s2 RRR, ST on monitor, no r/m/g Pulm: Regular, non labored on PRVC 100% PEEP of 5, breath sounds clear diminished-BUL & diminished-BLL GI: soft, rounded with midline incision and wound VAC in place-clean dry intact, faint bs GU: foley in place with clear amber urine Skin: Midline surgical incision with wound VAC in place, old scabbed lap sites noted Extremities: warm/dry, pulses + 2 R/P, +1 generalized edema noted  Resolved Hospital Problem list     Assessment & Plan:  Post-operative mechanical ventilation - Ventilator settings: PRVC  8 mL/kg, 100% FiO2, 5 PEEP, continue ventilator support & lung protective strategies - Wean PEEP & FiO2 as tolerated, maintain SpO2 > 90% - Head of bed elevated 30 degrees, VAP protocol in place - Plateau pressures less than 30 cm H20  - Intermittent chest x-ray & ABG PRN - Daily WUA with SBT as tolerated  - Ensure adequate pulmonary hygiene  - bronchodilators PRN - PAD protocol in place: continue Fentanyl IVP & Precedex drip  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  EBL 1500 mL, patient received 4 units PRBCs + 4 L IVF + FFP & platelets + albumin IntraOp -General Surgery managing, appreciate input -Continue Cefotan -Stat follow-up labs, maintain INR <1.5 and platelets > 60 -Monitor wound VAC -Turn every 2 utilize offloading devices as needed -Continue cyclic TPN per general surgery recommendations -N.p.o., PAD protocol in place for pain control  Circulatory shock -Follow-up stat lactic, BMP, CBC  -Continue Levophed drip, wean as tolerated to maintain MAP > 65 -Continuous cardiac monitoring  Hypothyroidism -N.p.o. postop, consider transitioning Synthroid to IV route if patient remains n.p.o. > 5 days  Best Practice (right click and "Reselect all SmartList Selections" daily)  Diet/type: NPO DVT prophylaxis: SCD GI prophylaxis: PPI Lines: Central line, Arterial Line, and yes and it is still needed Foley:  Yes, and it is still needed Code Status:  full code Last date of multidisciplinary goals of care discussion [per primary]  Labs   CBC: Recent Labs  Lab 04/27/23 0605 04/28/23 1128 04/28/23 1606  WBC 2.4* 6.1  --   HGB 8.6* 8.3* 8.7*  HCT 27.0* 25.7* 25.9*  MCV 96.1 92.8  --   PLT 60* 75*  --     Basic Metabolic Panel: Recent Labs  Lab 04/27/23 0605 04/28/23 1128  NA 138 136  K 3.7 4.2  CL 106 107  CO2 28 23  GLUCOSE 185* 235*  BUN 22* 22*  CREATININE 0.54 0.54  CALCIUM 8.0* 10.0  MG 2.1  --   PHOS 2.9  --    GFR: Estimated Creatinine Clearance: 82 mL/min (by C-G formula based on SCr of 0.54 mg/dL). Recent Labs  Lab 04/27/23 0605 04/28/23 1128  WBC 2.4* 6.1    Liver Function Tests: Recent  Labs  Lab 04/27/23 0605 04/28/23 1128  AST 45* 55*  ALT 32 33  ALKPHOS 170* 141*  BILITOT 0.6 1.4*  PROT 5.8* 5.1*  ALBUMIN 2.3* 2.1*   No results for input(s): "LIPASE", "AMYLASE" in the last 168 hours. No results for input(s): "AMMONIA" in  the last 168 hours.  ABG    Component Value Date/Time   PHART 7.29 (L) 04/28/2023 1555   PCO2ART 44 04/28/2023 1555   PO2ART 158 (H) 04/28/2023 1555   HCO3 21.2 04/28/2023 1555   ACIDBASEDEF 5.2 (H) 04/28/2023 1555   O2SAT 99.7 04/28/2023 1555     Coagulation Profile: Recent Labs  Lab 04/27/23 0605 04/28/23 1128  INR 1.5* 1.4*    Cardiac Enzymes: No results for input(s): "CKTOTAL", "CKMB", "CKMBINDEX", "TROPONINI" in the last 168 hours.  HbA1C: Hgb A1c MFr Bld  Date/Time Value Ref Range Status  04/02/2023 08:56 AM 6.8 (H) 4.8 - 5.6 % Final    Comment:    (NOTE) Pre diabetes:          5.7%-6.4%  Diabetes:              >6.4%  Glycemic control for   <7.0% adults with diabetes     CBG: Recent Labs  Lab 04/27/23 1848 04/27/23 2233 04/28/23 0527 04/28/23 0701 04/28/23 0913  GLUCAP 110* 123* 196* 171* 163*    Review of Systems:   UTA patient intubated and sedated unable to participate in interview at this time  Past Medical History:  She,  has a past medical history of Anemia, Colon cancer (HCC) (04/2020), History of kidney stones, Hypothyroidism, and Thyroid disease.   Surgical History:   Past Surgical History:  Procedure Laterality Date   ABDOMINAL WALL DEFECT REPAIR N/A 02/13/2022   Procedure: REPAIR ABDOMINAL WALL;  Surgeon: Leafy Ro, MD;  Location: [facility];  Service: General;  Laterality: N/A;   APPENDECTOMY N/A 02/13/2022   Procedure: APPENDECTOMY;  Surgeon: Leafy Ro, MD;  Location: [facility];  Service: General;  Laterality: N/A;   APPLICATION OF WOUND VAC N/A 02/15/2022   Procedure: APPLICATION OF WOUND VAC;  Surgeon: Leafy Ro, MD;  Location: [facility];  Service: General;  Laterality: N/A;  [id number]   COLON RESECTION SIGMOID N/A 02/13/2022   Procedure: COLON RESECTION SIGMOID, open, [name], PA-C to assist;  Surgeon: Leafy Ro, MD;  Location: [facility];  Service: General;  Laterality: N/A;   COLONOSCOPY WITH PROPOFOL N/A  11/21/2019   Procedure: COLONOSCOPY WITH PROPOFOL;  Surgeon: Toney Reil, MD;  Location: [facility];  Service: Gastroenterology;  Laterality: N/A;   COLONOSCOPY WITH PROPOFOL N/A 07/10/2021   Procedure: COLONOSCOPY WITH PROPOFOL;  Surgeon: Toney Reil, MD;  Location: [facility];  Service: Gastroenterology;  Laterality: N/A;   COLONOSCOPY WITH PROPOFOL N/A 02/06/2022   Procedure: COLONOSCOPY WITH PROPOFOL;  Surgeon: Toney Reil, MD;  Location: [facility] ENDOSCOPY;  Service: Gastroenterology;  Laterality: N/A;  SPANISH   CYSTOSCOPY WITH STENT PLACEMENT Left 01/30/2020   Procedure: CYSTOSCOPY WITH STENT PLACEMENT;  Surgeon: Vanna Scotland, MD;  Location: [facility];  Service: Urology;  Laterality: Left;   ESOPHAGOGASTRODUODENOSCOPY (EGD) WITH PROPOFOL N/A 11/21/2019   Procedure: ESOPHAGOGASTRODUODENOSCOPY (EGD) WITH PROPOFOL;  Surgeon: Toney Reil, MD;  Location: [facility] ENDOSCOPY;  Service: Gastroenterology;  Laterality: N/A;   INSERTION OF MESH N/A 02/13/2022   Procedure: INSERTION OF MESH;  Surgeon: Leafy Ro, MD;  Location: [facility];  Service: General;  Laterality: N/A;   INSERTION OF  MESH N/A 02/15/2022   Procedure: INSERTION OF MESH;  Surgeon: Leafy Ro, MD;  Location: [facility];  Service: General;  Laterality: N/A;   IR RADIOLOGIST EVAL & MGMT  07/03/2020   IR RADIOLOGIST EVAL & MGMT  07/12/2020   LAPAROTOMY N/A 02/15/2022   Procedure: EXPLORATORY LAPAROTOMY REPAIR OF BOWEL PERFORATION;  Surgeon: Leafy Ro, MD;  Location: [facility];  Service: General;  Laterality: N/A;   PARTIAL COLECTOMY N/A 01/30/2020   Procedure: PARTIAL COLECTOMY Sigmoid;  Surgeon: Duanne Guess, MD;  Location: [facility];  Service: General;  Laterality: N/A;   PORTACATH PLACEMENT Left 03/28/2020   Procedure: INSERTION PORT-A-CATH;  Surgeon: Duanne Guess, MD;  Location: [facility];  Service: General;  Laterality: Left;   PORTACATH PLACEMENT N/A 08/29/2020   Procedure: INSERTION  PORT-A-CATH, chemotherapy;  Surgeon: Duanne Guess, MD;  Location: [facility];  Service: General;  Laterality: N/A;   PORTACATH PLACEMENT N/A 12/19/2022   Procedure: INSERTION PORT-A-CATH;  Surgeon: Leafy Ro, MD;  Location: [facility];  Service: General;  Laterality: N/A;   VENTRAL HERNIA REPAIR N/A 02/13/2022   Procedure: HERNIA REPAIR VENTRAL ADULT, open, [name], PA-C to assist;  Surgeon: Leafy Ro, MD;  Location: [facility];  Service: General;  Laterality: N/A;     Social History:   reports that she has never smoked. She has never used smokeless tobacco. She reports that she does not drink alcohol and does not use drugs.   Family History:  Her family history includes Diabetes in her sister.   Allergies No Known Allergies   Home Medications  Prior to Admission medications   Medication Sig Start Date End Date Taking? Authorizing Provider  metroNIDAZOLE (FLAGYL) 500 MG tablet Take 2 tabs (1000 mg total) at 8 am, again at 2 pm, and again at 8 pm the day prior to surgery. 02/10/22  Yes Pabon, Diego F, MD  neomycin (MYCIFRADIN) 500 MG tablet Take 2 tabs (1000 mg total) at 8 am, again at 2 pm, and again at 8 pm the day prior to surgery. 02/10/22  Yes Pabon, Diego F, MD  polyethylene glycol powder (MIRALAX) 17 GM/SCOOP powder Mix with 64 ounces of Gatorade (no red) the day prior to surgery and drink as directed. 02/10/22  Yes Pabon, Diego F, MD  docusate sodium (COLACE) 100 MG capsule Take 1 capsule (100 mg total) by mouth daily. 05/18/20   Rickard Patience, MD  levothyroxine (SYNTHROID) 137 MCG tablet Take 137 mcg by mouth daily before breakfast.     [provider]  polyethylene glycol (MIRALAX) 17 g packet Take 17 g by mouth daily as needed. 10/29/20   Rickard Patience, MD  prochlorperazine (COMPAZINE) 10 MG tablet Take 1 tablet (10 mg total) by mouth every 6 (six) hours as needed (Nausea or vomiting). 03/08/20 02/22/22  Rickard Patience, MD     Critical care time: 84 minutes       Betsey Holiday, AGACNP-BC Acute Care Nurse Practitioner [facility]   [phone] / [phone] Please see Amion for details.

## 2023-04-28 NOTE — Anesthesia Procedure Notes (Signed)
Arterial Line Insertion Start/End8/20/2024 7:48 AM, 04/28/2023 7:53 AM Performed by: Foye Deer, MD, anesthesiologist  Patient location: OR. Preanesthetic checklist: patient identified, IV checked, site marked, risks and benefits discussed, surgical consent, monitors and equipment checked, pre-op evaluation, timeout performed and anesthesia consent Lidocaine 1% used for infiltration Left, radial was placed Catheter size: 20 G Hand hygiene performed  and maximum sterile barriers used   Attempts: 1 Procedure performed using ultrasound guided technique. Ultrasound Notes:anatomy identified, needle tip was noted to be adjacent to the nerve/plexus identified and no ultrasound evidence of intravascular and/or intraneural injection Following insertion, dressing applied and Biopatch. Post procedure assessment: normal and unchanged  Patient tolerated the procedure well with no immediate complications.

## 2023-04-28 NOTE — Progress Notes (Signed)
eLink Physician-Brief Progress Note Patient Name: Debra [name] DOB: 1966-08-31 MRN: [id number]   Date of Service  04/28/2023  HPI/Events of Note  Patient admitted to the ICU with post-op respiratory failure following an exploratory laparotomy with extensive surgical intervention, including partial gastrectomy and bowel resection.  eICU Interventions  New Patient Evaluation.        Migdalia Dk 04/28/2023, 9:48 PM

## 2023-04-28 NOTE — Anesthesia Preprocedure Evaluation (Addendum)
Anesthesia Evaluation  Patient identified by MRN, date of birth, ID band Patient awake    Reviewed: Allergy & Precautions, H&P , NPO status , Patient's Chart, lab work & pertinent test results  Airway Mallampati: IV  TM Distance: >3 FB Neck ROM: full    Dental  (+) Poor Dentition, Missing, Chipped   Pulmonary neg pulmonary ROS   Pulmonary exam normal        Cardiovascular Exercise Tolerance: Poor Normal cardiovascular exam  Lower extremity edema treated with lasix   Neuro/Psych negative neurological ROS  negative psych ROS   GI/Hepatic Neg liver ROS,,,Enterocutaneous Fistula TPN requirement   Endo/Other  Hypothyroidism  Morbid obesity  Renal/GU      Musculoskeletal   Abdominal  (+) + obese  Peds  Hematology  (+) Blood dyscrasia, anemia Thrombocytopenia Elevated INR 1.5   Anesthesia Other Findings 57 y.o. female with high output enterocutaneous fistula 129 Days Post-Op placement of right IJ port-a-catheter with dual reservoirs also s/p re-opening of laparotomy for repair of small bowel perforation following initial laparotomy, excision of greater omental mass, abdominal wall reconstruction with [name], appendectomy, and placement of Prevena vac on 06/08.  Past Medical History: No date: Anemia 04/2020: Colon cancer (HCC) No date: History of kidney stones No date: Hypothyroidism No date: Thyroid disease  Past Surgical History: 02/13/2022: ABDOMINAL WALL DEFECT REPAIR; N/A     Comment:  Procedure: REPAIR ABDOMINAL WALL;  Surgeon: Leafy Ro, MD;  Location: [facility];  Service: General;                Laterality: N/A; 02/13/2022: APPENDECTOMY; N/A     Comment:  Procedure: APPENDECTOMY;  Surgeon: Leafy Ro, MD;                Location: [facility];  Service: General;  Laterality: N/A; 02/15/2022: APPLICATION OF WOUND VAC; N/A     Comment:  Procedure: APPLICATION OF WOUND VAC;  Surgeon:  Leafy Ro, MD;  Location: [facility];  Service: General;                Laterality: N/A;  [id number] 02/13/2022: COLON RESECTION SIGMOID; N/A     Comment:  Procedure: COLON RESECTION SIGMOID, open, [name], PA-C to assist;  Surgeon: Leafy Ro, MD;                Location: [facility];  Service: General;  Laterality: N/A; 11/21/2019: COLONOSCOPY WITH PROPOFOL; N/A     Comment:  Procedure: COLONOSCOPY WITH PROPOFOL;  Surgeon: Toney Reil, MD;  Location: [facility];  Service:               Gastroenterology;  Laterality: N/A; 07/10/2021: COLONOSCOPY WITH PROPOFOL; N/A     Comment:  Procedure: COLONOSCOPY WITH PROPOFOL;  Surgeon: Toney Reil, MD;  Location: [facility];  Service:               Gastroenterology;  Laterality: N/A; 02/06/2022: COLONOSCOPY WITH PROPOFOL; N/A     Comment:  Procedure: COLONOSCOPY WITH PROPOFOL;  Surgeon: Allegra Lai,  Loel Dubonnet, MD;  Location: [facility];  Service:               Gastroenterology;  Laterality: N/A;  SPANISH 01/30/2020: CYSTOSCOPY WITH STENT PLACEMENT; Left     Comment:  Procedure: CYSTOSCOPY WITH STENT PLACEMENT;  Surgeon:               Vanna Scotland, MD;  Location: [facility];  Service:               Urology;  Laterality: Left; 11/21/2019: ESOPHAGOGASTRODUODENOSCOPY (EGD) WITH PROPOFOL; N/A     Comment:  Procedure: ESOPHAGOGASTRODUODENOSCOPY (EGD) WITH               PROPOFOL;  Surgeon: Toney Reil, MD;  Location:               [facility];  Service: Gastroenterology;  Laterality:               N/A; 02/13/2022: INSERTION OF MESH; N/A     Comment:  Procedure: INSERTION OF MESH;  Surgeon: Leafy Ro,               MD;  Location: [facility];  Service: General;  Laterality:               N/A; 02/15/2022: INSERTION OF MESH; N/A     Comment:  Procedure: INSERTION OF MESH;  Surgeon: Leafy Ro,               MD;  Location: [facility];  Service:  General;  Laterality:               N/A; 07/03/2020: IR RADIOLOGIST EVAL & MGMT 07/12/2020: IR RADIOLOGIST EVAL & MGMT 02/15/2022: LAPAROTOMY; N/A     Comment:  Procedure: EXPLORATORY LAPAROTOMY REPAIR OF BOWEL               PERFORATION;  Surgeon: Leafy Ro, MD;  Location:               [facility];  Service: General;  Laterality: N/A; 01/30/2020: PARTIAL COLECTOMY; N/A     Comment:  Procedure: PARTIAL COLECTOMY Sigmoid;  Surgeon: Duanne Guess, MD;  Location: [facility];  Service: General;                Laterality: N/A; 03/28/2020: PORTACATH PLACEMENT; Left     Comment:  Procedure: INSERTION PORT-A-CATH;  Surgeon: Duanne Guess, MD;  Location: [facility];  Service: General;                Laterality: Left; 08/29/2020: PORTACATH PLACEMENT; N/A     Comment:  Procedure: INSERTION PORT-A-CATH, chemotherapy;                Surgeon: Duanne Guess, MD;  Location: [facility];                Service: General;  Laterality: N/A; 12/19/2022: PORTACATH PLACEMENT; N/A     Comment:  Procedure: INSERTION PORT-A-CATH;  Surgeon: Leafy Ro, MD;  Location: [facility];  Service: General;                Laterality: N/A; 02/13/2022: VENTRAL HERNIA REPAIR; N/A     Comment:  Procedure: HERNIA REPAIR VENTRAL ADULT, open, Earna Coder  Manus Rudd, PA-C to assist;  Surgeon: Leafy Ro, MD;                Location: [facility];  Service: General;  Laterality: N/A;  BMI    Body Mass Index: 48.09 kg/m      Reproductive/Obstetrics negative OB ROS                             Anesthesia Physical Anesthesia Plan  ASA: 4  Anesthesia Plan: General ETT   Post-op Pain Management: Ofirmev IV (intra-op)*, Ketamine IV* and Regional block*   Induction: Intravenous  PONV Risk Score and Plan: 2 and Ondansetron, Dexamethasone and Midazolam  Airway Management Planned: Oral ETT  Additional Equipment: Arterial line  Intra-op Plan:    Post-operative Plan: Extubation in OR  Informed Consent: I have reviewed the patients History and Physical, chart, labs and discussed the procedure including the risks, benefits and alternatives for the proposed anesthesia with the patient or authorized representative who has indicated his/her understanding and acceptance.     Dental Advisory Given  Plan Discussed with: CRNA and Surgeon  Anesthesia Plan Comments:         Anesthesia Quick Evaluation

## 2023-04-28 NOTE — Progress Notes (Signed)
PHARMACY - TOTAL PARENTERAL NUTRITION CONSULT NOTE   Indication: Prolonged ileus  Patient Measurements: Height: 4\' 11"  (149.9 cm) Weight: 106.6 kg (235 lb) IBW/kg (Calculated) : 43.2 TPN AdjBW (KG): 59 Body mass index is 47.46 kg/m.  Assessment: Debra [name] is a 57 y.o. female s/p laparotomy, excision of greater omental mass, abdominal wall reconstruction with [name], appendectomy, and placement of Prevena vac.  Glucose / Insulin:  --Last Hgb A1c 6.8% on 04/02/23 --mSSI 4x/day (0500, 1300, 1800, 2200) --BG last 24h: 180 --SSI last 24h: 3 units Electrolytes: wnl Renal: SCr stable at baseline Hepatic: AST 45, ALT 32, Tbili 0.6 TG: 8/12: 166 GI Imaging: 9/11 CTAP: no new acute issues GI Surgeries / Procedures: s/p laparotomy, excision of greater omental mass, abdominal wall reconstruction with [name], appendectomy, and placement of Prevena vac 12/19/22 placement of right IJ port-a-catheter with dual reservoirs also s/p re-opening of laparotomy for repair of small bowel perforation   Central access: PICC 04/09/2023   TPN start date: 04/09/23    (was on TPN from 02/15/22 to 04/05/23- held for bacteremia and TPN restarted 04/09/23)  Nutritional Goals: Goal cyclic TPN over 16 hrs: cyclic TPN over 16 hrs: (provides 115.5 g of protein and 1614 kcals per day) total volume over 16hrs for calculations=1500 ml  RD Assessment:  Estimated Needs Total Energy Estimated Needs: 1800-2100kcal/day Total Protein Estimated Needs: 90-110g/d Total Fluid Estimated Needs: 1.4-1.6L/day  Current Nutrition:  Soft diet + nutritional supplements, not meeting PO needs  Plan:  Transitioned to *Cyclic* TPN on 1/61/09.   to run over 16 hours per MD request. [name] at 2000 per discussion with dietician to allow patient time for walking in afternoon/evenings.  To run over 16 hours: -Start rate at 53 mL/hr for 1 hour. -Increase rate to 105 mL/hr for 13 hours.  -Decrease rate to  53 mL/hr for 1 hour. -Decrease rate to 26 mL/hr for 1 hour, then stop.  Plan:  8/3: continue cyclic TPN at goal rate  See cyclic TPN rate above Nutritional Components Amino acids (using 15% Clinisol): 115.5 Dextrose 12% = 180 g  Lipids (using 20% SMOFlipids): 52.2 g kCal: 1614/24h  7/25: Adjusted Dextrose from 17% to 12% at physician request and increase Amino acids from 108g to 115.5g per dietary. Total volume increasing from to  to see if helps with blood sugars Electrolytes in TPN: Na 75 mEq/L, K 50 mEq/L, Ca 33mEq/L, Mg 10 mEq/L, Phos 10 mmol/L, Cl:Ac 1:1 Resume MVI, trace elements, chromium, zinc 10 mg, and selenium 150 mcg per dietary on 03/30/23 Continue CBG/SSI for Cyclic TPN:  -CBG 2 hrs after cyclic TPN start -CBG during middle of cyclic TPN -CBG 1 hr after cylic TPN stopped -CBG while off TPN Continue SSI insulin at moderate scale  Check TPN labs weekly on Mondays and more frequently if indicated   Elliot Gurney, PharmD, BCPS Clinical Pharmacist  04/28/2023 11:13 AM

## 2023-04-28 NOTE — Transfer of Care (Signed)
Immediate Anesthesia Transfer of Care Note  Patient: Debra [name]  Procedure(s) Performed: EXPLORATORY LAPAROTOMY; POSSIBLE SMALL BOWEL RESECTION (Abdomen) Reconstruction of ABDOMINAL WALL (Abdomen) SMALL BOWEL RESECTION (Abdomen)  Patient Location: ICU  Anesthesia Type:General  Level of Consciousness: Patient remains intubated per anesthesia plan  Airway & Oxygen Therapy: Patient remains intubated per anesthesia plan and Patient placed on Ventilator (see vital sign flow sheet for setting)  Post-op Assessment: Report given to RN and Post -op Vital signs reviewed and stable  Post vital signs: Reviewed and stable  Last Vitals:  Vitals Value Taken Time  BP 110/61 04/28/23 2051  Temp 36.8 C 04/28/23 2050  Pulse 116 04/28/23 2101  Resp 19 04/28/23 2102  SpO2 99 % 04/28/23 2101  Vitals shown include unfiled device data.  Last Pain:  Vitals:   04/28/23 2050  TempSrc: Oral  PainSc:       Patients Stated Pain Goal: 0 (04/22/23 0739)  Complications: No notable events documented.

## 2023-04-29 DIAGNOSIS — K432 Incisional hernia without obstruction or gangrene: Secondary | ICD-10-CM

## 2023-04-29 DIAGNOSIS — M7989 Other specified soft tissue disorders: Secondary | ICD-10-CM

## 2023-04-29 LAB — BASIC METABOLIC PANEL
Anion gap: 4 — ABNORMAL LOW (ref 5–15)
BUN: 28 mg/dL — ABNORMAL HIGH (ref 6–20)
CO2: 22 mmol/L (ref 22–32)
Calcium: 7.3 mg/dL — ABNORMAL LOW (ref 8.9–10.3)
Chloride: 111 mmol/L (ref 98–111)
Creatinine, Ser: 0.78 mg/dL (ref 0.44–1.00)
GFR, Estimated: >60 mL/min (ref >60)
Glucose, Bld: 433 mg/dL — ABNORMAL HIGH (ref 70–99)
Potassium: 4.7 mmol/L (ref 3.5–5.1)
Sodium: 137 mmol/L (ref 135–145)

## 2023-04-29 LAB — PREPARE FRESH FROZEN PLASMA

## 2023-04-29 LAB — CBC
HCT: 25.6 % — ABNORMAL LOW (ref 36.0–46.0)
Hemoglobin: 9 g/dL — ABNORMAL LOW (ref 12.0–15.0)
MCH: 29.1 pg (ref 26.0–34.0)
MCHC: 35.2 g/dL (ref 30.0–36.0)
MCV: 82.8 fL (ref 80.0–100.0)
Platelets: 91 10*3/uL — ABNORMAL LOW (ref 150–400)
RBC: 3.09 MIL/uL — ABNORMAL LOW (ref 3.87–5.11)
RDW: 20.5 % — ABNORMAL HIGH (ref 11.5–15.5)
WBC: 9 10*3/uL (ref 4.0–10.5)
nRBC: 0.2 % (ref 0.0–0.2)

## 2023-04-29 LAB — CBC WITH DIFFERENTIAL/PLATELET
Abs Immature Granulocytes: 0.01 10*3/uL (ref 0.00–0.07)
Basophils Absolute: 0 10*3/uL (ref 0.0–0.1)
Basophils Relative: 0 %
Eosinophils Absolute: 0 10*3/uL (ref 0.0–0.5)
Eosinophils Relative: 0 %
HCT: 18.7 % — ABNORMAL LOW (ref 36.0–46.0)
Hemoglobin: 6.3 g/dL — ABNORMAL LOW (ref 12.0–15.0)
Immature Granulocytes: 0 %
Lymphocytes Relative: 21 %
Lymphs Abs: 0.5 10*3/uL — ABNORMAL LOW (ref 0.7–4.0)
MCH: 28.8 pg (ref 26.0–34.0)
MCHC: 33.7 g/dL (ref 30.0–36.0)
MCV: 85.4 fL (ref 80.0–100.0)
Monocytes Absolute: 0.4 10*3/uL (ref 0.1–1.0)
Monocytes Relative: 13 %
Neutro Abs: 1.7 10*3/uL (ref 1.7–7.7)
Neutrophils Relative %: 66 %
Platelets: 37 10*3/uL — ABNORMAL LOW (ref 150–400)
RBC: 2.19 MIL/uL — ABNORMAL LOW (ref 3.87–5.11)
RDW: 20.9 % — ABNORMAL HIGH (ref 11.5–15.5)
Smear Review: NORMAL
WBC: 2.6 10*3/uL — ABNORMAL LOW (ref 4.0–10.5)
nRBC: 1.1 % — ABNORMAL HIGH (ref 0.0–0.2)

## 2023-04-29 LAB — LACTIC ACID, PLASMA
Lactic Acid, Venous: 1.3 mmol/L (ref 0.5–1.9)
Lactic Acid, Venous: 3.4 mmol/L (ref 0.5–1.9)
Lactic Acid, Venous: 3.5 mmol/L (ref 0.5–1.9)
Lactic Acid, Venous: 4.4 mmol/L (ref 0.5–1.9)

## 2023-04-29 LAB — BPAM FFP
Blood Product Expiration Date: 202408252359
Blood Product Expiration Date: 202408252359
ISSUE DATE / TIME: 202408200953
ISSUE DATE / TIME: 202408200953
Unit Type and Rh: 5100
Unit Type and Rh: 5100

## 2023-04-29 LAB — PREPARE RBC (CROSSMATCH)

## 2023-04-29 LAB — BLOOD GAS, ARTERIAL
Acid-base deficit: 4.1 mmol/L — ABNORMAL HIGH (ref 0.0–2.0)
Bicarbonate: 22.2 mmol/L (ref 20.0–28.0)
FIO2: 70 %
MECHVT: 360 mL
Mechanical Rate: 20
O2 Saturation: 99.5 %
PEEP: 5 cmH2O
Patient temperature: 37
pCO2 arterial: 44 mmHg (ref 32–48)
pH, Arterial: 7.31 — ABNORMAL LOW (ref 7.35–7.45)
pO2, Arterial: 95 mmHg (ref 83–108)

## 2023-04-29 LAB — BPAM PLATELET PHERESIS
Blood Product Expiration Date: 202408222359
ISSUE DATE / TIME: 202408200858
Unit Type and Rh: 5100

## 2023-04-29 LAB — HEPATIC FUNCTION PANEL
ALT: 45 U/L — ABNORMAL HIGH (ref 0–44)
AST: 60 U/L — ABNORMAL HIGH (ref 15–41)
Albumin: 2.2 g/dL — ABNORMAL LOW (ref 3.5–5.0)
Alkaline Phosphatase: 74 U/L (ref 38–126)
Bilirubin, Direct: 0.5 mg/dL — ABNORMAL HIGH (ref 0.0–0.2)
Indirect Bilirubin: 0.6 mg/dL (ref 0.3–0.9)
Total Bilirubin: 1.1 mg/dL (ref 0.3–1.2)
Total Protein: 4.4 g/dL — ABNORMAL LOW (ref 6.5–8.1)

## 2023-04-29 LAB — GLUCOSE, CAPILLARY
Glucose-Capillary: 113 mg/dL — ABNORMAL HIGH (ref 70–99)
Glucose-Capillary: 122 mg/dL — ABNORMAL HIGH (ref 70–99)
Glucose-Capillary: 162 mg/dL — ABNORMAL HIGH (ref 70–99)
Glucose-Capillary: 195 mg/dL — ABNORMAL HIGH (ref 70–99)
Glucose-Capillary: 206 mg/dL — ABNORMAL HIGH (ref 70–99)
Glucose-Capillary: 253 mg/dL — ABNORMAL HIGH (ref 70–99)
Glucose-Capillary: 302 mg/dL — ABNORMAL HIGH (ref 70–99)
Glucose-Capillary: 345 mg/dL — ABNORMAL HIGH (ref 70–99)
Glucose-Capillary: 371 mg/dL — ABNORMAL HIGH (ref 70–99)
Glucose-Capillary: 382 mg/dL — ABNORMAL HIGH (ref 70–99)
Glucose-Capillary: 393 mg/dL — ABNORMAL HIGH (ref 70–99)
Glucose-Capillary: 396 mg/dL — ABNORMAL HIGH (ref 70–99)
Glucose-Capillary: 409 mg/dL — ABNORMAL HIGH (ref 70–99)
Glucose-Capillary: 81 mg/dL (ref 70–99)
Glucose-Capillary: 91 mg/dL (ref 70–99)

## 2023-04-29 LAB — PREPARE PLATELET PHERESIS: Unit division: 0

## 2023-04-29 LAB — PROTIME-INR
INR: 1.4 — ABNORMAL HIGH (ref 0.8–1.2)
Prothrombin Time: 17.7 seconds — ABNORMAL HIGH (ref 11.4–15.2)

## 2023-04-29 LAB — BRAIN NATRIURETIC PEPTIDE: B Natriuretic Peptide: 118.6 pg/mL — ABNORMAL HIGH (ref 0.0–100.0)

## 2023-04-29 MED ORDER — ALBUMIN HUMAN 25 % IV SOLN
25.0000 g | Freq: Four times a day (QID) | INTRAVENOUS | Status: AC
  Administered 2023-04-29 (×2): 25 g via INTRAVENOUS
  Filled 2023-04-29 (×2): qty 100

## 2023-04-29 MED ORDER — INSULIN REGULAR(HUMAN) IN NACL 100-0.9 UT/100ML-% IV SOLN
INTRAVENOUS | Status: DC
  Administered 2023-04-29: 23 [IU]/h via INTRAVENOUS
  Administered 2023-04-29: 8.5 [IU]/h via INTRAVENOUS
  Filled 2023-04-29: qty 100

## 2023-04-29 MED ORDER — IPRATROPIUM-ALBUTEROL 0.5-2.5 (3) MG/3ML IN SOLN: 3.0000 mL | Freq: Four times a day (QID) | RESPIRATORY_TRACT | Status: DC | PRN

## 2023-04-29 MED ORDER — DEXTROSE 50 % IV SOLN: 0.0000 mL | INTRAVENOUS | Status: DC | PRN

## 2023-04-29 MED ORDER — DOCUSATE SODIUM 50 MG/5ML PO LIQD: 100.0000 mg | Freq: Two times a day (BID) | ORAL | Status: DC

## 2023-04-29 MED ORDER — SODIUM CHLORIDE 0.9% IV SOLUTION: Freq: Once | INTRAVENOUS | Status: AC

## 2023-04-29 MED ORDER — TRACE MINERALS CU-MN-SE-ZN 300-55-60-3000 MCG/ML IV SOLN
INTRAVENOUS | Status: AC
  Filled 2023-04-29: qty 729.6

## 2023-04-29 MED ORDER — HYDROMORPHONE HCL 1 MG/ML IJ SOLN
0.5000 mg | INTRAMUSCULAR | Status: DC | PRN
  Administered 2023-04-29 – 2023-04-30 (×9): 1 mg via INTRAVENOUS
  Administered 2023-05-03: 0.5 mg via INTRAVENOUS
  Administered 2023-05-03 – 2023-05-13 (×18): 1 mg via INTRAVENOUS
  Administered 2023-05-13: 0.5 mg via INTRAVENOUS
  Administered 2023-05-14 (×2): 1 mg via INTRAVENOUS
  Filled 2023-04-29 (×32): qty 1

## 2023-04-29 MED ORDER — INSULIN ASPART 100 UNIT/ML IJ SOLN
0.0000 [IU] | INTRAMUSCULAR | Status: DC
  Administered 2023-04-29: 1 [IU] via SUBCUTANEOUS
  Administered 2023-04-29: 4 [IU] via SUBCUTANEOUS
  Administered 2023-04-30 (×3): 5 [IU] via SUBCUTANEOUS
  Administered 2023-04-30: 2 [IU] via SUBCUTANEOUS
  Filled 2023-04-29 (×6): qty 1

## 2023-04-29 MED ORDER — LACTATED RINGERS IV BOLUS
500.0000 mL | Freq: Once | INTRAVENOUS | Status: AC
  Administered 2023-04-29: 500 mL via INTRAVENOUS

## 2023-04-29 MED ORDER — FUROSEMIDE 20 MG PO TABS: 20.0000 mg | ORAL_TABLET | Freq: Two times a day (BID) | ORAL | Status: DC

## 2023-04-29 MED ORDER — ORAL CARE MOUTH RINSE: 15.0000 mL | OROMUCOSAL | Status: DC | PRN

## 2023-04-29 NOTE — Progress Notes (Signed)
PT Cancellation Note  Patient Details Name: Debra [name] MRN: [id number] DOB: Jul 06, 1966   Cancelled Treatment:    Reason Eval/Treat Not Completed: Other (comment): Surgery team requested that PT evaluation be held until 04/30/23 secondary to extensive nature of her surgery.  Will attempt to see pt at a future date/time as medically appropriate.   Ovidio Hanger PT, DPT 04/29/23, 11:20 AM

## 2023-04-29 NOTE — Progress Notes (Signed)
PHARMACY - TOTAL PARENTERAL NUTRITION CONSULT NOTE   Indication: Prolonged ileus  Patient Measurements: Height: 4\' 11"  (149.9 cm) Weight: 112.3 kg (247 lb 9.2 oz) IBW/kg (Calculated) : 43.2 TPN AdjBW (KG): 59 Body mass index is 50 kg/m.  Assessment: Debra [name] is a 57 y.o. female s/p laparotomy, excision of greater omental mass, abdominal wall reconstruction with [name], appendectomy, and placement of Prevena vac.  Glucose / Insulin: currently on insulin infusion at 23 units/hr Electrolytes: wnl Renal: SCr stable at baseline Hepatic: AST / ALT elevated  TG: 8/12: 166, stable GI Imaging: 9/11 CTAP: no new acute issues GI Surgeries / Procedures:  --s/p laparotomy, excision of greater omental mass, abdominal wall reconstruction with [name], appendectomy, and placement of Prevena vac 12/19/22 placement of right IJ port-a-catheter with dual reservoirs also s/p re-opening of laparotomy for repair of small bowel perforation  --08/20  exploratory laparotomy, extensive lysis of adhesion, 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, partial gastrectomy, abdominal wall reconstruction with mesh placement, and wound vac placement   Central access: PICC 04/09/2023   TPN start date: 04/09/23    (was on TPN from 02/15/22 to 04/05/23- held for bacteremia and TPN restarted 04/09/23)  RD Assessment:  Estimated Needs Total Energy Estimated Needs: 1800-2100kcal/day Total Protein Estimated Needs: 90-110g/d Total Fluid Estimated Needs: 1.4-1.6L/day  Current Nutrition: NPO  Plan:  ---given recent surgery tart continuous TPN at 38mL/hr at 1800 Electrolytes in TPN (standard): Na 25mEq/L, K 66mEq/L, Ca 66mEq/L, Mg 76mEq/L, and Phos 79mmol/L. Cl:Ac 1:1 ---continue insulin infusion until glucose more controlled per DC recommendations ---Monitor TPN labs on Mon/Thurs, more frequently until stable   Burnis Medin, PharmD, BCPS Clinical Pharmacist   04/29/2023 7:07 AM

## 2023-04-29 NOTE — Inpatient Diabetes Management (Signed)
Inpatient Diabetes Program Recommendations  AACE/ADA: New Consensus Statement on Inpatient Glycemic Control (2015)  Target Ranges:  Prepandial:   less than 140 mg/dL      Peak postprandial:   less than 180 mg/dL (1-2 hours)      Critically ill patients:  140 - 180 mg/dL   Lab Results  Component Value Date   GLUCAP 302 (H) 04/29/2023   HGBA1C 6.8 (H) 04/02/2023    Inpatient Diabetes Program Recommendations:   Noted patient CBGs elevated post receiving Decadron 5 mg. yesterday @ 1610.  Would recommend remaining on IV insulin drip until steroids start to clear. Patient has not required insulin in TPN for several days so would suggest: -Novolog 0-9 units q 4 hrs. While NPO when ready to transition to subcutaneous insulin.  Thank you, Debra [name]. Marcella Dunnaway, RN, MSN, CDE  Diabetes Coordinator Inpatient Glycemic Control Team Team Pager [phone] (8am-5pm) 04/29/2023 10:54 AM

## 2023-04-29 NOTE — Progress Notes (Signed)
NAME:  Debra [name], MRN:  [id number], DOB:  August 18, 1966, LOS: 440 ADMISSION DATE:  02/13/2022, CONSULTATION DATE:  04/28/23 REFERRING MD:  Dr. 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  Hospital course: Prolonged stay see significant Hospital events  04/28/2023 most recent significant labs: (Labs/ Imaging personally reviewed) Recent labs significant for hyperglycemia, mild transaminitis, hypoalbuminemia and a mildly elevated BUN as well as mild anemia, thrombocytopenia Chemistry: Na+:136, K+: 4.2, BUN/Cr.: 22/0.54, Serum CO2/ AG: 23/6, Alk phos: 141, Albumin: 2.1, AST/ALT: 55/33, T.Bili: 1.4, glucose: 235 Hematology: WBC: 6.1, Hgb: 8.3 > 8.7, plt: 75 INR: 1.4,  Parainfluenza +: 04/04/23  ABG: 7.29/ 44/ 158/ 21.2 CXR 04/04/23: low lung volumes with stable small L pleural effusion CT chest/ abdomen/ pelvis w contrast 04/01/23: Large anterior abdominal wound or defect with oral contrast pooling along the external surface. 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 Small left pleural effusion with compressive atelectasis in the left lower lobe. Hazy parenchymal densities in both lungs are similar to the previous examination and nonspecific. Findings could be associated with atelectasis or post inflammatory changes. Small ventral hernias along the left side of the abdominal wound/defect. There is a loop of decompressed  small bowel associated with these small ventral hernias. Small amount of fluid in the abdomen is new. There is small focal collection adjacent to the proximal stomach that measures up to 2.3 cm. Chronic vertebral body compression fracture involving L1.  PCCM consulted for assistance in management and monitoring due to post-operative respiratory failure requiring continued mechanical ventilatory support in the setting of partial gastrectomy, hernia repair, small bowel resection, repair entertomy and abdominal wall reconstruction.  Pertinent  Medical History  Colon Cancer Anemia Hypothyroidism  Significant Hospital Events: Including procedures, antibiotic start and stop dates in addition to other pertinent events   02/13/22: initial laparotomy, excision of greater omental mass, abdominal wall reconstruction with [name], appendectomy, and placement of Prevena vac  02/15/22: EXPLORATORY LAPAROTOMY REPAIR OF BOWEL PERFORATION, INSERTION OF MESH, APPLICATION OF WOUND VAC 02/26/22:  COLON RESECTION SIGMOID, HERNIA REPAIR VENTRAL, APPENDECTOMY, INSERTION OF MESH, REPAIR ABDOMINAL WALL 07/15/22: Neuro surgery consultation due to L1 compression fracture. Not a candidate for lumbosacral orthotic. 12/19/22: Insertion of Port-a-cath 04/06/23: removal of port-a-cath Throughout stay patient has required TPN, WOC & ID consultation. 04/28/23: 12 hours and surgery for abdominal wall reconstruction, recurrent incisional hernia repair, partial gastrectomy, small bowel resection, repair enterotomy x 3 and wound VAC placement.  Patient extubated postop but too lethargic to maintain airway requiring reintubation and transferred to ICU for postoperative mechanical ventilatory management. 04/29/23: Pt remains mechanically intubated FiO2 40%/PEEP 5.  Pt awake and with purposeful movement on precedex gtt.  Will perform SBT with plans for possible extubation   Interim History / Subjective:  As outlined above under  significant   Objective   Blood pressure 109/62, pulse 65, temperature 97.8 F (36.6 C), temperature source Axillary, resp. rate 19, height 4\' 11"  (1.499 m), weight 112.3 kg, SpO2 94%.  Vent Mode: PSV;CPAP FiO2 (%):  [40 %-100 %] 40 % Set Rate:  [15 bmp-20 bmp] 20 bmp Vt Set:  [360 mL] 360 mL PEEP:  [5 cmH20] 5 cmH20 Pressure Support:  [5 cmH20] 5 cmH20 Plateau Pressure:  [9 cmH20-23 cmH20] 21 cmH20   Intake/Output Summary (Last 24 hours) at 04/29/2023 0753 Last data filed at 04/29/2023 0600 Gross per 24 hour  Intake 10672.03 ml  Output 3775 ml  Net 6897.03 ml   Filed Weights   04/28/23 0708 04/28/23 2104 04/29/23 0326  Weight: 106.6 kg 118.5 kg 112.3 kg    Examination: General: Acutely ill appearing obese female, NAD mechanically intubated  HEENT: MM pink/moist, anicteric, atraumatic, neck supple Neuro: Awake and following commands, PERRLA  CV: NSR, s1s2, no m/r/g, 2+ radial/1+ distal pulses, trace generalized edema Pulm: Diminished throughout, even, non labored GI: Faint BS x4, obese, soft, midline incision and wound VAC in place-clean dry intact, JP drains present x2 draining serosanguinous fluid  GU: Indwelling foley catheter in place draining clear yellow urine  Skin: Midline surgical incision with wound VAC in place, old scabbed lap sites noted Extremities: Warm/dry  Resolved Hospital Problem list     Assessment & Plan:  #Post-operative mechanical ventilation - Ventilator settings: PRVC  8 mL/kg, 100% FiO2, 5 PEEP, continue ventilator support & lung protective strategies - Wean PEEP & FiO2 as tolerated, maintain SpO2 > 90% - Head of bed elevated 30 degrees, VAP protocol in place - Plateau pressures less than 30 cm H20  - Intermittent chest x-ray & ABG PRN - Daily WUA with SBT as tolerated  - Ensure adequate pulmonary hygiene  - Prn bronchodilator therapy  - PAD protocol in place: continue Fentanyl IVP & Precedex drip  #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  EBL 1500 mL, patient received 4 units PRBCs + 4 L IVF + FFP & platelets + albumin IntraOp - General Surgery managing, appreciate input - Continue cefotan - Maintain INR <1.5 and platelets > 60 - Monitor wound VAC - Turn every 2 utilize offloading devices as needed - Continue cyclic TPN per general surgery recommendations - NPO, PAD protocol in place for pain control  #Circulatory shock - Trend lactic acid, BMP, CBC  - Continue levophed drip, wean as tolerated to maintain MAP > 65 - Continuous cardiac monitoring  #Hyperglycemia  - CBG's per endo tool  - Continue insulin gtt   #Hypothyroidism - Consider transitioning Synthroid to IV route if patient remains n.p.o. > 5 days  Best Practice (right click and "Reselect all SmartList Selections" daily)  Diet/type: TPN DVT prophylaxis: SCD GI prophylaxis: PPI Lines: Yes and still needed  Foley:  Yes, and it is still needed Code Status:  full code Last date of multidisciplinary goals of care discussion [04/29/23]  08/21: Updated pt regarding pt condition and current plan of care utilizing Spanish Interpretor  Labs   CBC: Recent Labs  Lab 04/27/23 0605 04/28/23 1128 04/28/23 1606 04/28/23 2116 04/29/23 0346  WBC 2.4* 6.1  --  12.8* 9.0  HGB 8.6* 8.3* 8.7* 11.1* 9.0*  HCT 27.0* 25.7* 25.9* 33.4* 25.6*  MCV 96.1 92.8  --  85.6 82.8  PLT 60* 75*  --  150 91*    Basic Metabolic Panel: Recent Labs  Lab 04/27/23 0605 04/28/23 1128  04/28/23 2116 04/29/23 0346  NA 138 136 138 137  K 3.7 4.2 5.0 4.7  CL 106 107 110 111  CO2 28 23 20* 22  GLUCOSE 185* 235* 320* 433*  BUN 22* 22* 27* 28*  CREATININE 0.54 0.54 0.83 0.78  CALCIUM 8.0* 10.0 7.3* 7.3*  MG 2.1  --  1.7  --   PHOS 2.9   --  5.8*  --    GFR: Estimated Creatinine Clearance: 84.6 mL/min (by C-G formula based on SCr of 0.78 mg/dL). Recent Labs  Lab 04/27/23 0605 04/27/23 2345 04/28/23 1128 04/28/23 2116 04/29/23 0346  WBC 2.4*  --  6.1 12.8* 9.0  LATICACIDVEN  --  3.5*  --  3.6* 3.4*    Liver Function Tests: Recent Labs  Lab 04/27/23 0605 04/28/23 1128 04/28/23 2116 04/29/23 0346  AST 45* 55* 75* 60*  ALT 32 33 49* 45*  ALKPHOS 170* 141* 90 74  BILITOT 0.6 1.4* 2.2* 1.1  PROT 5.8* 5.1* 4.9* 4.4*  ALBUMIN 2.3* 2.1* 2.7* 2.2*   No results for input(s): "LIPASE", "AMYLASE" in the last 168 hours. No results for input(s): "AMMONIA" in the last 168 hours.  ABG    Component Value Date/Time   PHART 7.31 (L) 04/29/2023 0005   PCO2ART 44 04/29/2023 0005   PO2ART 95 04/29/2023 0005   HCO3 22.2 04/29/2023 0005   ACIDBASEDEF 4.1 (H) 04/29/2023 0005   O2SAT 99.5 04/29/2023 0005     Coagulation Profile: Recent Labs  Lab 04/27/23 0605 04/28/23 1128 04/28/23 2116 04/29/23 0346  INR 1.5* 1.4* 1.5* 1.4*    Cardiac Enzymes: No results for input(s): "CKTOTAL", "CKMB", "CKMBINDEX", "TROPONINI" in the last 168 hours.  HbA1C: Hgb A1c MFr Bld  Date/Time Value Ref Range Status  04/02/2023 08:56 AM 6.8 (H) 4.8 - 5.6 % Final    Comment:    (NOTE) Pre diabetes:          5.7%-6.4%  Diabetes:              >6.4%  Glycemic control for   <7.0% adults with diabetes     CBG: Recent Labs  Lab 04/29/23 0331 04/29/23 0429 04/29/23 0539 04/29/23 0639 04/29/23 0734  GLUCAP 396* 393* 382* 409* 371*    Review of Systems:   UTA patient intubated and sedated unable to participate in interview at this time  Past Medical History:  She,  has a past medical history of Anemia, Colon cancer (HCC) (04/2020), History of kidney stones, Hypothyroidism, and Thyroid disease.   Surgical History:   Past Surgical History:  Procedure Laterality Date   ABDOMINAL WALL DEFECT REPAIR N/A 02/13/2022    Procedure: REPAIR ABDOMINAL WALL;  Surgeon: Leafy Ro, MD;  Location: [facility];  Service: General;  Laterality: N/A;   APPENDECTOMY N/A 02/13/2022   Procedure: APPENDECTOMY;  Surgeon: Leafy Ro, MD;  Location: [facility];  Service: General;  Laterality: N/A;   APPLICATION OF WOUND VAC N/A 02/15/2022   Procedure: APPLICATION OF WOUND VAC;  Surgeon: Leafy Ro, MD;  Location: [facility];  Service: General;  Laterality: N/A;  [id number]   COLON RESECTION SIGMOID N/A 02/13/2022   Procedure: COLON RESECTION SIGMOID, open, [name], PA-C to assist;  Surgeon: Leafy Ro, MD;  Location: [facility];  Service: General;  Laterality: N/A;   COLONOSCOPY WITH PROPOFOL N/A 11/21/2019   Procedure: COLONOSCOPY WITH PROPOFOL;  Surgeon: Toney Reil, MD;  Location: [facility];  Service: Gastroenterology;  Laterality: N/A;   COLONOSCOPY  WITH PROPOFOL N/A 07/10/2021   Procedure: COLONOSCOPY WITH PROPOFOL;  Surgeon: Toney Reil, MD;  Location: [facility] ENDOSCOPY;  Service: Gastroenterology;  Laterality: N/A;   COLONOSCOPY WITH PROPOFOL N/A 02/06/2022   Procedure: COLONOSCOPY WITH PROPOFOL;  Surgeon: Toney Reil, MD;  Location: [facility] ENDOSCOPY;  Service: Gastroenterology;  Laterality: N/A;  SPANISH   CYSTOSCOPY WITH STENT PLACEMENT Left 01/30/2020   Procedure: CYSTOSCOPY WITH STENT PLACEMENT;  Surgeon: Vanna Scotland, MD;  Location: [facility];  Service: Urology;  Laterality: Left;   ESOPHAGOGASTRODUODENOSCOPY (EGD) WITH PROPOFOL N/A 11/21/2019   Procedure: ESOPHAGOGASTRODUODENOSCOPY (EGD) WITH PROPOFOL;  Surgeon: Toney Reil, MD;  Location[facility] ENDOSCOPY;  Service: Gastroenterology;  Laterality: N/A;   INSERTION OF MESH N/A 02/13/2022   Procedure: INSERTION OF MESH;  Surgeon: Leafy Ro, MD;  Location: [facility];  Service: General;  Laterality: N/A;   INSERTION OF MESH N/A 02/15/2022   Procedure: INSERTION OF MESH;  Surgeon: Leafy Ro, MD;  Location: [facility];  Service:  General;  Laterality: N/A;   IR RADIOLOGIST EVAL & MGMT  07/03/2020   IR RADIOLOGIST EVAL & MGMT  07/12/2020   LAPAROTOMY N/A 02/15/2022   Procedure: EXPLORATORY LAPAROTOMY REPAIR OF BOWEL PERFORATION;  Surgeon: Leafy Ro, MD;  Location: [facility];  Service: General;  Laterality: N/A;   PARTIAL COLECTOMY N/A 01/30/2020   Procedure: PARTIAL COLECTOMY Sigmoid;  Surgeon: Duanne Guess, MD;  Location: [facility];  Service: General;  Laterality: N/A;   PORTACATH PLACEMENT Left 03/28/2020   Procedure: INSERTION PORT-A-CATH;  Surgeon: Duanne Guess, MD;  Location: [facility];  Service: General;  Laterality: Left;   PORTACATH PLACEMENT N/A 08/29/2020   Procedure: INSERTION PORT-A-CATH, chemotherapy;  Surgeon: Duanne Guess, MD;  Location: [facility];  Service: General;  Laterality: N/A;   PORTACATH PLACEMENT N/A 12/19/2022   Procedure: INSERTION PORT-A-CATH;  Surgeon: Leafy Ro, MD;  Location: [facility];  Service: General;  Laterality: N/A;   VENTRAL HERNIA REPAIR N/A 02/13/2022   Procedure: HERNIA REPAIR VENTRAL ADULT, open, [name], PA-C to assist;  Surgeon: Leafy Ro, MD;  Location: [facility];  Service: General;  Laterality: N/A;     Social History:   reports that she has never smoked. She has never used smokeless tobacco. She reports that she does not drink alcohol and does not use drugs.   Family History:  Her family history includes Diabetes in her sister.   Allergies No Known Allergies   Home Medications  Prior to Admission medications   Medication Sig Start Date End Date Taking? Authorizing Provider  metroNIDAZOLE (FLAGYL) 500 MG tablet Take 2 tabs (1000 mg total) at 8 am, again at 2 pm, and again at 8 pm the day prior to surgery. 02/10/22  Yes Pabon, Diego F, MD  neomycin (MYCIFRADIN) 500 MG tablet Take 2 tabs (1000 mg total) at 8 am, again at 2 pm, and again at 8 pm the day prior to surgery. 02/10/22  Yes Pabon, Diego F, MD  polyethylene glycol powder (MIRALAX) 17 GM/SCOOP  powder Mix with 64 ounces of Gatorade (no red) the day prior to surgery and drink as directed. 02/10/22  Yes Pabon, Diego F, MD  docusate sodium (COLACE) 100 MG capsule Take 1 capsule (100 mg total) by mouth daily. 05/18/20   Rickard Patience, MD  levothyroxine (SYNTHROID) 137 MCG tablet Take 137 mcg by mouth daily before breakfast.     [provider]  polyethylene glycol (MIRALAX) 17 g packet Take 17 g by mouth  daily as needed. 10/29/20   Rickard Patience, MD  prochlorperazine (COMPAZINE) 10 MG tablet Take 1 tablet (10 mg total) by mouth every 6 (six) hours as needed (Nausea or vomiting). 03/08/20 02/22/22  Rickard Patience, MD     Critical care time: 40 minutes       Zada Girt, [facility]  Pulmonary/Critical Care Pager [phone] (please enter 7 digits) PCCM Consult Pager [phone] (please enter 7 digits)

## 2023-04-29 NOTE — Anesthesia Postprocedure Evaluation (Signed)
Anesthesia Post Note  Patient: Debra [name]  Procedure(s) Performed: EXPLORATORY LAPAROTOMY; POSSIBLE SMALL BOWEL RESECTION (Abdomen) Reconstruction of ABDOMINAL WALL (Abdomen) SMALL BOWEL RESECTION (Abdomen)  Patient location during evaluation: ICU Anesthesia Type: General Level of consciousness: sedated and patient remains intubated per anesthesia plan Vital Signs Assessment: post-procedure vital signs reviewed and stable Respiratory status: patient on ventilator - see flowsheet for VS Cardiovascular status: stable Anesthetic complications: no   No notable events documented.   Last Vitals:  Vitals:   04/29/23 0700 04/29/23 0715  BP: 104/60 109/62  Pulse: 62 65  Resp: 17 19  Temp:    SpO2: 97% 94%    Last Pain:  Vitals:   04/29/23 0400  TempSrc: Axillary  PainSc:                  Jaye Beagle

## 2023-04-29 NOTE — Progress Notes (Addendum)
[facility] SURGICAL PROGRESS NOTE  Hospital Day(s): 440.   Post op day(s): 1 Day Post-Op.   Interval History:  Patient seen and examined No acute events or new complaints overnight.  She remains intubated this morning; plan to extubate Levophed 5 mcg/min; weaning She is without leukocytosis this AM; WBC 9.0K Hgb to 9.0 (Trend: 8.3 --> 8.7 --> 11.1 --> 9.0) PLT are 91K Renal function normal; sCr - 0.78; UO - 1200 ccs LFTs are stable INR 1.4; stable Lactic acidosis 3.4 (from 3.6) Most recent ABG with improvement; pH 7.31 (from 7.18) NGT in place; clamped Surgical drains as follow:   - LLQ (abdominal wall); 75 ccs; serous  - RLQ (Intra-abdominal); 0 ccs Wound vac in place; serous output Continues on perioperative Cefotetan    Vital signs in last 24 hours: [min-max] current  Temp:  [96.6 F (35.9 C)-98.2 F (36.8 C)] 97.8 F (36.6 C) (08/21 0400) Pulse Rate:  [60-114] 62 (08/21 0700) Resp:  [11-31] 17 (08/21 0700) BP: (88-133)/(55-83) 104/60 (08/21 0700) SpO2:  [94 %-97 %] 97 % (08/21 0700) Arterial Line BP: (71-131)/(46-86) 84/81 (08/21 0515) FiO2 (%):  [60 %-100 %] 60 % (08/21 0400) Weight:  [112.3 kg-118.5 kg] 112.3 kg (08/21 0326)     Height: 4\' 11"  (149.9 cm) Weight: 112.3 kg BMI (Calculated): 49.98   Intake/Output last 2 shifts:  08/20 0701 - 08/21 0700 In: 16109 [I.V.:7136.5; Blood:2131; IV Piggyback:1404.6] Out: 3775 [Urine:1200; Drains:1025; Blood:1550]   Physical Exam:  Constitutional: sedated, intubated Respiratory: breathing non-labored at rest; on ventilator Cardiovascular: regular rate and sinus rhythm  Gastrointestinal: soft, unable to assess tenderness given sedation, and non-distended. LLQ drain (intra-abdominal); serous output. RLQ drain (abdominal wall); no output.  Genitourinary: Foley in place; good UO Integumentary: Wound vac to midline wound with retention sutures inferiorly and superiorly; good seal, output serous.   Labs:      Latest Ref Rng & Units 04/29/2023    3:46 AM 04/28/2023    9:16 PM 04/28/2023    4:06 PM  CBC  WBC 4.0 - 10.5 K/uL 9.0  12.8    Hemoglobin 12.0 - 15.0 g/dL 9.0  60.4  8.7   Hematocrit 36.0 - 46.0 % 25.6  33.4  25.9   Platelets 150 - 400 K/uL 91  150        Latest Ref Rng & Units 04/29/2023    3:46 AM 04/28/2023    9:16 PM 04/28/2023   11:28 AM  CMP  Glucose 70 - 99 mg/dL 540  981  191   BUN 6 - 20 mg/dL 28  27  22    Creatinine 0.44 - 1.00 mg/dL 4.78  2.95  6.21   Sodium 135 - 145 mmol/L 137  138  136   Potassium 3.5 - 5.1 mmol/L 4.7  5.0  4.2   Chloride 98 - 111 mmol/L 111  110  107   CO2 22 - 32 mmol/L 22  20  23    Calcium 8.9 - 10.3 mg/dL 7.3  7.3  30.8   Total Protein 6.5 - 8.1 g/dL 4.4  4.9  5.1   Total Bilirubin 0.3 - 1.2 mg/dL 1.1  2.2  1.4   Alkaline Phos 38 - 126 U/L 74  90  141   AST 15 - 41 U/L 60  75  55   ALT 0 - 44 U/L 45  49  33     Imaging studies: No new pertinent imaging studies   Assessment/Plan:  57 y.o. female  1 Day Post-Op s/p exploratory laparotomy, extensive lysis of adhesion, 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    - Appreciate PCCM support; extubate when feasible; wean vasopressor  - Will transition back to full TPN; anticipate ileus  - Continue NGT decompression; LIS; monitor and record output  - Complete perioperative Abx  - Continue foley catheter today  - Continue surgical drains x2; monitor and record out daily - Continue wound vac; 75 mmHg; monitor and record output - Will consult WOC RN to assist in changes. Likely begin Friday 08/23  - Monitor abdominal examination; on-going bowel function   - Pain control prn; antiemetics prn   - Monitor lactic acidosis, leukocytosis, renal function  - Will engage PT when feasible  - Keep ICU today   All of the above findings and recommendations were discussed with the medical  team.   Daughter at bedside at 9:36 AM and updated, patient now extubated  -- [name], PA-C [facility] 04/29/2023, 7:13 AM M-F: 7am - 4pm

## 2023-04-29 NOTE — Consult Note (Signed)
WOC consult for NPWT after abd wall reconstruction with mesh placement 05/01/2023 Dr. Everlene Farrier.    NPWT M-W-F with 2 white foam over mesh and 1 silver at 75 mm hg.  First postop change Thursday 8/22/204 per conversation with surgical team.  Will then go to M-W-F schedule starting Monday 05/04/2023.   Surgical team to place supplies in room for first postop change.  WOC team will follow.    Thank you,    Priscella Mann MSN, RN-BC, [employer] [phone]

## 2023-04-30 ENCOUNTER — Inpatient Hospital Stay: Payer: Medicaid Other

## 2023-04-30 ENCOUNTER — Inpatient Hospital Stay (HOSPITAL_COMMUNITY)
Admission: RE | Admit: 2023-04-30 | Discharge: 2023-04-30 | Disposition: A | Discharge status: Home / Self Care | Payer: Medicaid Other | Source: Home / Self Care | Attending: Internal Medicine | Admitting: Internal Medicine

## 2023-04-30 DIAGNOSIS — I5030 Unspecified diastolic (congestive) heart failure: Secondary | ICD-10-CM

## 2023-04-30 LAB — BASIC METABOLIC PANEL
Anion gap: 6 (ref 5–15)
BUN: 35 mg/dL — ABNORMAL HIGH (ref 6–20)
CO2: 26 mmol/L (ref 22–32)
Calcium: 7.9 mg/dL — ABNORMAL LOW (ref 8.9–10.3)
Chloride: 110 mmol/L (ref 98–111)
Creatinine, Ser: 0.82 mg/dL (ref 0.44–1.00)
GFR, Estimated: >60 mL/min (ref >60)
Glucose, Bld: 275 mg/dL — ABNORMAL HIGH (ref 70–99)
Potassium: 4.2 mmol/L (ref 3.5–5.1)
Sodium: 142 mmol/L (ref 135–145)

## 2023-04-30 LAB — CBC
HCT: 24.9 % — ABNORMAL LOW (ref 36.0–46.0)
HCT: 25 % — ABNORMAL LOW (ref 36.0–46.0)
Hemoglobin: 8.4 g/dL — ABNORMAL LOW (ref 12.0–15.0)
Hemoglobin: 8.4 g/dL — ABNORMAL LOW (ref 12.0–15.0)
MCH: 29.3 pg (ref 26.0–34.0)
MCH: 31.5 pg (ref 26.0–34.0)
MCHC: 33.7 g/dL (ref 30.0–36.0)
MCHC: 33.6 g/dL (ref 30.0–36.0)
MCV: 86.8 fL (ref 80.0–100.0)
MCV: 93.6 fL (ref 80.0–100.0)
Platelets: 52 10*3/uL — ABNORMAL LOW (ref 150–400)
Platelets: 62 10*3/uL — ABNORMAL LOW (ref 150–400)
RBC: 2.87 MIL/uL — ABNORMAL LOW (ref 3.87–5.11)
RBC: 2.67 MIL/uL — ABNORMAL LOW (ref 3.87–5.11)
RDW: 19.9 % — ABNORMAL HIGH (ref 11.5–15.5)
RDW: 21.8 % — ABNORMAL HIGH (ref 11.5–15.5)
WBC: 4.9 10*3/uL (ref 4.0–10.5)
WBC: 6 10*3/uL (ref 4.0–10.5)
nRBC: 0 % (ref 0.0–0.2)
nRBC: 0.7 % — ABNORMAL HIGH (ref 0.0–0.2)

## 2023-04-30 LAB — ECHOCARDIOGRAM COMPLETE
AR max vel: 2.3 cm2
AV Area VTI: 2.05 cm2
AV Area mean vel: 2.13 cm2
AV Mean grad: 3 mmHg
AV Peak grad: 6.9 mmHg
Ao pk vel: 1.31 m/s
Area-P 1/2: 4.96 cm2
Height: 59 in
MV VTI: 1.93 cm2
S' Lateral: 2.6 cm
Weight: 4292.8 oz

## 2023-04-30 LAB — IRON AND TIBC
Iron: 16 ug/dL — ABNORMAL LOW (ref 28–170)
Saturation Ratios: 9 % — ABNORMAL LOW (ref 10.4–31.8)
TIBC: 172 ug/dL — ABNORMAL LOW (ref 250–450)
UIBC: 156 ug/dL

## 2023-04-30 LAB — GLUCOSE, CAPILLARY
Glucose-Capillary: 188 mg/dL — ABNORMAL HIGH (ref 70–99)
Glucose-Capillary: 291 mg/dL — ABNORMAL HIGH (ref 70–99)
Glucose-Capillary: 290 mg/dL — ABNORMAL HIGH (ref 70–99)
Glucose-Capillary: 289 mg/dL — ABNORMAL HIGH (ref 70–99)
Glucose-Capillary: 264 mg/dL — ABNORMAL HIGH (ref 70–99)
Glucose-Capillary: 315 mg/dL — ABNORMAL HIGH (ref 70–99)

## 2023-04-30 LAB — FERRITIN: Ferritin: 86 ng/mL (ref 11–307)

## 2023-04-30 LAB — RETICULOCYTES
Immature Retic Fract: 44.1 % — ABNORMAL HIGH (ref 2.3–15.9)
RBC.: 2.69 MIL/uL — ABNORMAL LOW (ref 3.87–5.11)
Retic Count, Absolute: 114.6 10*3/uL (ref 19.0–186.0)
Retic Ct Pct: 4.3 % — ABNORMAL HIGH (ref 0.4–3.1)

## 2023-04-30 LAB — PHOSPHORUS: Phosphorus: 2.8 mg/dL (ref 2.5–4.6)

## 2023-04-30 LAB — TSH: TSH: 3.667 u[IU]/mL (ref 0.350–4.500)

## 2023-04-30 LAB — PROTIME-INR
INR: 1.6 — ABNORMAL HIGH (ref 0.8–1.2)
Prothrombin Time: 19.2 seconds — ABNORMAL HIGH (ref 11.4–15.2)

## 2023-04-30 LAB — MAGNESIUM: Magnesium: 2.6 mg/dL — ABNORMAL HIGH (ref 1.7–2.4)

## 2023-04-30 MED ORDER — IRON SUCROSE 500 MG IVPB - SIMPLE MED
500.0000 mg | Freq: Once | INTRAVENOUS | Status: AC
  Administered 2023-04-30: 500 mg via INTRAVENOUS
  Filled 2023-04-30: qty 275

## 2023-04-30 MED ORDER — FUROSEMIDE 10 MG/ML IJ SOLN: 40.0000 mg | Freq: Once | INTRAMUSCULAR | Status: DC

## 2023-04-30 MED ORDER — INSULIN ASPART 100 UNIT/ML IJ SOLN: 0.0000 [IU] | Freq: Three times a day (TID) | INTRAMUSCULAR | Status: DC

## 2023-04-30 MED ORDER — METOPROLOL TARTRATE 5 MG/5ML IV SOLN
5.0000 mg | Freq: Four times a day (QID) | INTRAVENOUS | Status: DC | PRN
  Administered 2023-04-30 – 2023-05-01 (×3): 5 mg via INTRAVENOUS
  Filled 2023-04-30 (×3): qty 5

## 2023-04-30 MED ORDER — INSULIN GLARGINE-YFGN 100 UNIT/ML ~~LOC~~ SOLN
8.0000 [IU] | Freq: Every day | SUBCUTANEOUS | Status: DC
  Administered 2023-04-30: 8 [IU] via SUBCUTANEOUS
  Filled 2023-04-30: qty 0.08

## 2023-04-30 MED ORDER — PERFLUTREN LIPID MICROSPHERE
1.0000 mL | INTRAVENOUS | Status: AC | PRN
  Administered 2023-04-30: 4 mL via INTRAVENOUS

## 2023-04-30 MED ORDER — INSULIN GLARGINE-YFGN 100 UNIT/ML ~~LOC~~ SOLN
10.0000 [IU] | Freq: Every day | SUBCUTANEOUS | Status: DC
  Administered 2023-05-01: 10 [IU] via SUBCUTANEOUS
  Filled 2023-04-30 (×2): qty 0.1

## 2023-04-30 MED ORDER — MAGNESIUM FOR TPN
INJECTION | INTRAVENOUS | Status: DC
Start: 2023-04-30 — End: 2023-04-30

## 2023-04-30 MED ORDER — VITAMIN K1 10 MG/ML IJ SOLN
2.0000 mg | Freq: Once | INTRAMUSCULAR | Status: AC
  Administered 2023-04-30: 2 mg via SUBCUTANEOUS
  Filled 2023-04-30: qty 0.2

## 2023-04-30 MED ORDER — KETOROLAC TROMETHAMINE 30 MG/ML IJ SOLN
30.0000 mg | Freq: Four times a day (QID) | INTRAMUSCULAR | Status: DC | PRN
  Administered 2023-04-30 – 2023-05-04 (×5): 30 mg via INTRAVENOUS
  Filled 2023-04-30 (×5): qty 1

## 2023-04-30 MED ORDER — INSULIN ASPART 100 UNIT/ML IJ SOLN
0.0000 [IU] | Freq: Every day | INTRAMUSCULAR | Status: DC
  Administered 2023-04-30: 4 [IU] via SUBCUTANEOUS
  Filled 2023-04-30: qty 1

## 2023-04-30 MED ORDER — TRACE MINERALS CU-MN-SE-ZN 300-55-60-3000 MCG/ML IV SOLN
INTRAVENOUS | Status: DC
  Filled 2023-04-30: qty 729.6

## 2023-04-30 MED ORDER — ACETAMINOPHEN 10 MG/ML IV SOLN
1000.0000 mg | Freq: Four times a day (QID) | INTRAVENOUS | Status: AC | PRN
  Administered 2023-04-30: 1000 mg via INTRAVENOUS
  Filled 2023-04-30: qty 100

## 2023-04-30 MED ORDER — STERILE WATER FOR INJECTION IV SOLN
INTRAVENOUS | Status: AC
  Filled 2023-04-30: qty 729.6

## 2023-04-30 MED ORDER — SODIUM CHLORIDE 0.9 % IV BOLUS
500.0000 mL | Freq: Once | INTRAVENOUS | Status: AC
  Administered 2023-04-30: 500 mL via INTRAVENOUS

## 2023-04-30 MED ORDER — INSULIN GLARGINE-YFGN 100 UNIT/ML ~~LOC~~ SOLN
5.0000 [IU] | Freq: Once | SUBCUTANEOUS | Status: AC
  Administered 2023-04-30: 5 [IU] via SUBCUTANEOUS
  Filled 2023-04-30: qty 0.05

## 2023-04-30 MED ORDER — SODIUM CHLORIDE 0.9% IV SOLUTION: Freq: Once | INTRAVENOUS | Status: AC

## 2023-04-30 MED ORDER — FUROSEMIDE 10 MG/ML IJ SOLN
40.0000 mg | Freq: Two times a day (BID) | INTRAMUSCULAR | Status: DC
  Administered 2023-04-30 – 2023-05-01 (×3): 40 mg via INTRAVENOUS
  Filled 2023-04-30 (×3): qty 4

## 2023-04-30 NOTE — Progress Notes (Signed)
   04/30/23 1000  Spiritual Encounters  Type of Visit Follow up  Care provided to: Family  Reason for visit Surgical  OnCall Visit Yes   Chaplain briefly checked in on patient and family. Patient still asleep and according to daughter at bedside, confused.

## 2023-04-30 NOTE — Progress Notes (Addendum)
Chest x-ray showed pulmonary congestion and possibly developing left-sided pleural effusion, received first dose of Lasix this afternoon, plan to repeat x-ray tomorrow to decide whether to increase the diuresis dosage.  Echo reviewed showed normal LVEF but grade 1 diastolic dysfunction and normal RV function.  Glucose continues > 250, will increase sliding scale to resistant scale and increased Lantus from 8 to 10 units daily  Nursing also reported that patient received Venofer every other month for chronic iron deficiency due to continuous TPN with no source of iron in the formula. Repeat iron study showed low iron, one dose of Venofer given.

## 2023-04-30 NOTE — Evaluation (Signed)
Physical Therapy Evaluation Patient Details Name: Debra [name] MRN: [id number] DOB: [birth year] Today's Date: 04/30/2023  History of Present Illness  57 y.o. female s/p laparotomy, excision of greater omental mass, abdominal wall reconstruction with [name], appendectomy, and placement of Prevena vac.  Clinical Impression  Patient presents with weakness, impaired balance, decreased activity tolerance, impaired functional mobility, and impaired cognition. Patient has been hospitalized for >1 year and was independent prior to admission. Evaluated after recent abdominal wall reconstruction and mesh placement with significant edema all over body. Not following commands throughout session. Attempts to say her name and boyfriend's name. TotalA for all mobility and limited this session by pain and edema. Patient will benefit from skilled PT services during acute stay to address listed deficits. Patient will benefit from ongoing therapy at discharge to maximize functional independence and safety.         If plan is discharge home, recommend the following: A lot of help with walking and/or transfers;A lot of help with bathing/dressing/bathroom;Assistance with cooking/housework;Direct supervision/assist for medications management;Direct supervision/assist for financial management;Assist for transportation;Help with stairs or ramp for entrance;Assistance with feeding   Can travel by private vehicle   No    Equipment Recommendations Other (comment) (TBD at next venue of care)  Recommendations for Other Services       Functional Status Assessment Patient has had a recent decline in their functional status and demonstrates the ability to make significant improvements in function in a reasonable and predictable amount of time.     Precautions / Restrictions Precautions Precautions: Fall Restrictions Weight Bearing Restrictions: No      Mobility  Bed Mobility                General bed mobility comments: total A when attempting to move any extremity in bed    Transfers                        Ambulation/Gait                  Stairs            Wheelchair Mobility     Tilt Bed    Modified Rankin (Stroke Patients Only)       Balance                                             Pertinent Vitals/Pain Pain Assessment Pain Assessment: Faces Faces Pain Scale: Hurts even more Pain Location: generalized Pain Descriptors / Indicators: Aching, Grimacing, Guarding, Discomfort Pain Intervention(s): Limited activity within patient's tolerance, Monitored during session, Repositioned, Patient requesting pain meds-RN notified    Home Living Family/patient expects to be discharged to:: Private residence Living Arrangements: Children Available Help at Discharge: Available 24 hours/day;Family Type of Home: House Home Access: Level entry     Alternate Level Stairs-Number of Steps: flight Home Layout: Two level;Bed/bath upstairs Home Equipment: None      Prior Function Prior Level of Function : Independent/Modified Independent             Mobility Comments: has been admitted for over 1 year and ambulating hallways with RW ADLs Comments: Pt has been admitted to hospital for over 1 year and has been ambulating halls with RW for toileting needs without assistance.     Extremity/Trunk Assessment   Upper Extremity Assessment  Upper Extremity Assessment: Defer to OT evaluation    Lower Extremity Assessment Lower Extremity Assessment: Generalized weakness (increased edema limiting AROM)       Communication   Communication Communication: Difficulty following commands/understanding;Difficulty communicating thoughts/reduced clarity of speech  Cognition Arousal: Alert Behavior During Therapy: Flat affect Overall Cognitive Status: Impaired/Different from baseline                                  General Comments: Pt unable to follow commands and just makes a moaning sound. Unable to answer questions but does attempt to say her name and boyfriends name in room        General Comments      Exercises     Assessment/Plan    PT Assessment Patient needs continued PT services  PT Problem List Decreased strength;Decreased activity tolerance;Decreased balance;Decreased mobility;Decreased cognition;Decreased knowledge of use of DME;Decreased safety awareness;Decreased knowledge of precautions;Cardiopulmonary status limiting activity;Obesity       PT Treatment Interventions DME instruction;Gait training;Functional mobility training;Therapeutic activities;Therapeutic exercise;Balance training;Patient/family education    PT Goals (Current goals can be found in the Care Plan section)  Acute Rehab PT Goals Patient Stated Goal: did not state PT Goal Formulation: With patient/family Time For Goal Achievement: 05/14/23 Potential to Achieve Goals: Fair    Frequency Min 1X/week     Co-evaluation               AM-PAC PT "6 Clicks" Mobility  Outcome Measure Help needed turning from your back to your side while in a flat bed without using bedrails?: Total Help needed moving from lying on your back to sitting on the side of a flat bed without using bedrails?: Total Help needed moving to and from a bed to a chair (including a wheelchair)?: Total Help needed standing up from a chair using your arms (e.g., wheelchair or bedside chair)?: Total Help needed to walk in hospital room?: Total Help needed climbing 3-5 steps with a railing? : Total 6 Click Score: 6    End of Session Equipment Utilized During Treatment: Oxygen Activity Tolerance: Treatment limited secondary to medical complications (Comment) Patient left: in bed;with call bell/phone within reach;with family/visitor present;with nursing/sitter in room Nurse Communication: Mobility status PT Visit Diagnosis: Muscle weakness  (generalized) (M62.81);Other abnormalities of gait and mobility (R26.89)    Time: 6387-5643 PT Time Calculation (min) (ACUTE ONLY): 19 min   Charges:   PT Evaluation $PT Eval High Complexity: 1 High   PT General Charges $$ ACUTE PT VISIT: 1 Visit         Maylon Peppers, PT, DPT Physical Therapist - [facility]  [facility]   Sabien Umland A Kathy Wares 04/30/2023, 3:55 PM

## 2023-04-30 NOTE — Progress Notes (Signed)
Nutrition Follow-up  DOCUMENTATION CODES:   Obesity unspecified  INTERVENTION:   TPN per pharmacy (24hr)- provides 2192kcal/day and 109g/day protein   Hold MVI, chromium and trace elements from TPN for tonight- will restart after vitamin labs drawn 8/23  Hold additional zinc and selenium from TPN  Daily weights   Discontinue vitamin A and ergocalciferol   Check vitamins A, D, zinc and copper   NUTRITION DIAGNOSIS:   Increased nutrient needs related to wound healing, catabolic illness as evidenced by estimated needs. -ongoing   GOAL:   Patient will meet greater than or equal to 90% of their needs -met with TPN  MONITOR:   Diet advancement, Labs, Weight trends, Skin, I & O's, Other (Comment) (TPN)  ASSESSMENT:   57 y/o female with h/o hypothyroidism, COVID 19 (3/21), kidney stones and stage 3 colon cancer (s/p left hemicolectomy 5/21 and chemotherapy) who is admitted for new pelvic mass now s/p laparotomy 6/8 (with excision of pelvic mass from greater omentum, abdominal wall reconstruction with bilateral myocutaneous flaps and mesh, incisional hernia repair, appendectomy repair and VAC placement) complicated by bowel perforation s/p reopening of recent laparotomy 6/10 (with repair of small bowel perforation, excision of mesh, placement of two phasix mesh and VAC placement). Pathology returned as metastatic adenocarcinoma. Pt with L1 compression fracture.   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 Pt s/p extensive abdominal surgery 8/20 requiring admission to the ICU for respiratory  failure and hypotension. Pt extubated 8/21. Pt remains on levophed. Pt with anasarca today and is receiving lasix. Per chart, pt is up ~50lbs from her UBW. OGT in place with minimal output.   TPN changed from cyclic back to continuous yesterday. Estimated needs adjusted to promote post op healing. Pt with hyperglycemia requiring insulin drip; drip was discontinued today. Would recommend addition of insulin to the TPN for optimal blood sugar control. Refeed labs and triglycerides stable. Pt's vitamin labs have been monitored regularly to monitor for deficiencies. Pt has required the addition of extra zinc and selenium to TPN secondary to increased losses from [facility] fistulas. Pt has required additional oral vitamin A and vitamin D. RD suspects GI losses will decrease now that fistulas have been resected. Will hold additives from TPN for tonight and check vitamin labs tomorrow. Pt did receive a dose of venofer (500mg ) today. Recommend continue TPN until wound healing is complete; will need to slowly transition off TPN and monitor oral intakes, weights and vitamin levels.   Medications reviewed and include: lasix, insulin, protonix, levophed, TPN  -Selenium- 98 wnl, zinc- 67 wnl, iodine- 49.8 wnl, chromium- 2.3(H), copper- 93 wnl, manganese- 19.0 wnl- 7/22 -vitamin D- 39.67 wnl, vitamin A 12.6(L), -Vitamin B1- 70.3 wnl, B6- 16.4 wnl, vitamin C- 0.4 wnl, vitamin E- 11.8 wnl, vitamin K 1.43 wnl- 7/22 -folate 14.8 wnl, B12- 870 wnl- 7/22 -carnitine 32.0 wnl- 4/12 -aluminum 10(H)- 4/12 --Iron 16(L), TIBC 172, ferritin 86- 8/22  Labs reviewed: Na 142 wnl, K 4.2 wnl, BUN 35(H), P 2.8 wnl, Mg 2.6(H) BMP- 118.6(H)- 8/21 Triglycerides- 166(H)- 8/19 Hgb 8.4(L), Hct 25.0(L) Cbgs- 290, 291, 188 x 24 hrs  Drains-   UOP-  Diet Order:    Diet Order             Diet NPO time specified  Diet effective now                  EDUCATION NEEDS:   Not appropriate for education at this  time  Skin:  Skin Assessment: Reviewed RN Assessment- abdominal surgical wound 15 cm x 11 cm x 2 cm, VAC   Last BM:  8/19  Height:   Ht Readings from Last 1 Encounters:  04/28/23 4\' 11"  (1.499 m)    Weight:   Wt Readings from Last 1 Encounters:  04/30/23 121.7 kg    Ideal Body Weight:  44.3 kg  BMI:  Body mass index is 54.19 kg/m.  Estimated Nutritional Needs:   Kcal:  1900-2200kcal/day  Protein:  95-110g/day  Fluid:  1.4-1.6L/day  Betsey Holiday MS, RD, LDN Please refer to [facility] for RD and/or RD on-call/weekend/after hours pager

## 2023-04-30 NOTE — Progress Notes (Signed)
Per Dr. Everlene Farrier keep patient npo today, may have ice chips and hard candy

## 2023-04-30 NOTE — Evaluation (Signed)
Occupational Therapy Evaluation Patient Details Name: Debra [name] MRN: [id number] DOB: June 14, 1966 Today's Date: 04/30/2023   History of Present Illness 57 y.o. female s/p laparotomy, excision of greater omental mass, abdominal wall reconstruction with [name], appendectomy, and placement of Prevena vac.   Clinical Impression   Patient presenting with decreased Ind in self care,balance, functional mobility/transfers, endurance, and safety awareness. Patient was Ind prior to hospital admission well over a year ago but has been here for an extended period of time secondary to medical concerns. Pt with new abdominal reconstruction and mesh placement. Significant edema noted all over body. Use of spanish interpreter this session. Pt does not follow any commands and just moans. She does attempt to say her name and the name of boyfriend who is present in room. Total A for oral care with use of suction brush. Total A for all mobility and limited this session secondary to increased pain. Patient will benefit from acute OT to increase overall independence in the areas of ADLs, functional mobility, and safety awareness in order to safely discharge.      If plan is discharge home, recommend the following: Assistance with cooking/housework;Direct supervision/assist for medications management;Assist for transportation;Help with stairs or ramp for entrance;Two people to help with walking and/or transfers;Two people to help with bathing/dressing/bathroom;Direct supervision/assist for financial management    Functional Status Assessment  Patient has had a recent decline in their functional status and demonstrates the ability to make significant improvements in function in a reasonable and predictable amount of time.  Equipment Recommendations  Other (comment) (defer to next venue of care)       Precautions / Restrictions Precautions Precautions: Fall      Mobility Bed Mobility                General bed mobility comments: total A when attempting to move any extremity in bed    Transfers                   General transfer comment: unsafe to attempt at this time          ADL either performed or assessed with clinical judgement   ADL Overall ADL's : Needs assistance/impaired                                       General ADL Comments: total A of 2     Vision Patient Visual Report: No change from baseline              Pertinent Vitals/Pain Pain Assessment Pain Assessment: Faces Faces Pain Scale: Hurts even more Pain Location: generalized Pain Descriptors / Indicators: Aching, Grimacing, Guarding, Discomfort Pain Intervention(s): Limited activity within patient's tolerance, Monitored during session, Repositioned, Patient requesting pain meds-RN notified     Extremity/Trunk Assessment Upper Extremity Assessment Upper Extremity Assessment: Generalized weakness (increased edema limiting AROM)   Lower Extremity Assessment Lower Extremity Assessment: Generalized weakness       Communication Communication Communication: Difficulty following commands/understanding;Difficulty communicating thoughts/reduced clarity of speech   Cognition Arousal: Alert Behavior During Therapy: Flat affect Overall Cognitive Status: Impaired/Different from baseline                                 General Comments: Pt unable to follow commands and just makes a moaning sound. Unable to answer  questions but does attempt to say her name and boyfriends name in room                Home Living Family/patient expects to be discharged to:: Private residence Living Arrangements: Children Available Help at Discharge: Available 24 hours/day;Family Type of Home: House Home Access: Level entry     Home Layout: Two level;Bed/bath upstairs Alternate Level Stairs-Number of Steps: flight             Home Equipment: None           Prior Functioning/Environment Prior Level of Function : Independent/Modified Independent               ADLs Comments: Pt has been admitted to hospital for over 1 year and has been ambulating halls with RW for toileting needs without assistance.        OT Problem List: Decreased strength;Decreased range of motion;Decreased activity tolerance;Impaired balance (sitting and/or standing);Pain;Increased edema;Decreased safety awareness;Decreased knowledge of use of DME or AE;Decreased cognition;Impaired UE functional use      OT Treatment/Interventions: Self-care/ADL training;Therapeutic exercise;Energy conservation;DME and/or AE instruction;Therapeutic activities;Patient/family education;Balance training;Cognitive remediation/compensation    OT Goals(Current goals can be found in the care plan section) Acute Rehab OT Goals Patient Stated Goal: to get better OT Goal Formulation: With patient/family Time For Goal Achievement: 05/28/23 Potential to Achieve Goals: Fair  OT Frequency: Min 1X/week       AM-PAC OT "6 Clicks" Daily Activity     Outcome Measure Help from another person eating meals?: Total Help from another person taking care of personal grooming?: Total Help from another person toileting, which includes using toliet, bedpan, or urinal?: Total Help from another person bathing (including washing, rinsing, drying)?: Total Help from another person to put on and taking off regular upper body clothing?: Total Help from another person to put on and taking off regular lower body clothing?: Total 6 Click Score: 6   End of Session Nurse Communication: Mobility status  Activity Tolerance: Patient limited by pain Patient left: in bed;with call bell/phone within reach;with bed alarm set;with nursing/sitter in room;with family/visitor present  OT Visit Diagnosis: Unsteadiness on feet (R26.81);Pain;Muscle weakness (generalized) (M62.81) Pain - part of body:  (abdomen)                 Time: 9629-5284 OT Time Calculation (min): 21 min Charges:  OT Evaluation $OT Eval High Complexity: 1 High [address] MS, OTR/L , CBIS ascom [phone]  04/30/23, 3:27 PM

## 2023-04-30 NOTE — Progress Notes (Signed)
PHARMACY - TOTAL PARENTERAL NUTRITION CONSULT NOTE   Indication: Prolonged ileus  Patient Measurements: Height: 4\' 11"  (149.9 cm) Weight: 121.7 kg (268 lb 4.8 oz) IBW/kg (Calculated) : 43.2 TPN AdjBW (KG): 59 Body mass index is 54.19 kg/m.  Assessment: Debra [name] is a 57 y.o. female s/p laparotomy, excision of greater omental mass, abdominal wall reconstruction with [name], appendectomy, and placement of Prevena vac.  Glucose / Insulin: insulin infusion stopped>>transitioned to SSI q4h + semglee 8 units Electrolytes: wnl Renal: SCr stable at baseline Hepatic: AST / ALT elevated  TG: 8/19: 166, stable GI Imaging: 9/11 CTAP: no new acute issues GI Surgeries / Procedures:  --s/p laparotomy, excision of greater omental mass, abdominal wall reconstruction with [name], appendectomy, and placement of Prevena vac 12/19/22 placement of right IJ port-a-catheter with dual reservoirs also s/p re-opening of laparotomy for repair of small bowel perforation  --08/20  exploratory laparotomy, extensive lysis of adhesion, 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 Cl:Ac 1:1 ---Will remove additives from TPN and order labs to assess concentrations. Plan to re-introduce tomorrow. ---Continue SSI 0-9 q4h + semglee 8 units ---Monitor TPN labs on Mon/Thurs, more  frequently until stable   Bettey Costa, PharmD Clinical Pharmacist 04/30/2023 12:27 PM

## 2023-04-30 NOTE — Progress Notes (Signed)
POD # 2 Still significant pain PL and Hb dropped, no evidence of active bleeding, responded to PL and rbc transfusion Off pressors this am Acidosis improved No bowel fx Good UO Wound vac 2240 and drains serous NG 50cc UO 1275  PE Expected pt uncomfortable given massive surgery Shallow breathing due to pain  Abd: wound vac in place, no peritonitis , drain serous as well as wound vac canister.   A/P Doing ok given massive operation Still need significant monitoring and pain meds Wound vac to be changed today and then Monday, will do 50 mm HG and white sponge underneath wound vac sponge. D/W WOund nurse in detail Dropped in hb likely dilutional given significant amounts of crystalloids given during surgery Hemodynamics improving Prolonged ileus In need for tpn

## 2023-04-30 NOTE — Progress Notes (Signed)
Dr. Chipper Herb made aware heart rate ranging 110-118 per md order ns 500cc bolus x1

## 2023-04-30 NOTE — Plan of Care (Addendum)
PCCM to sign off Patient successfully extubated, not on pressors  Dr Everlene Farrier would like TRH as consultation

## 2023-04-30 NOTE — Consult Note (Addendum)
WOC Nurse Consult Note: Reason for Consult: NPWT after abd wall reconstruction with mesh placement 04/28/2023 Dr. Everlene Farrier.  Wound type: full thickness, surgical  Pressure Injury POA: NA  Measurement:15 cm x 11 cm x 2 cm with varying amounts of undermining (greatest 8 cm at 12 o'clock)  Wound bed: not visualized, mesh in place; wound edges noted to be pink and moist  Drainage (amount, consistency, odor) minimal serosanguinous  Periwound: intact sutures at 12 o'clock and 6 o'clock; dip in abdominal topography at 3 o'clock  Dressing procedure/placement/frequency: Removed old NPWT dressing Cleansed wound with normal saline Periwound skin protected with skin barrier wipe  I did cut a barrier ring in half and flattened out, placed flattened piece of barrier ring at 6 o'clock and 3 o'clock to enhance seal, covered sutures at 12 o'clock with Mepitel prior to drape being applied.  Filled wound with 1 piece of Mepitel, 2 pieces of white foam, 1 piece of silver foam  Sealed NPWT dressing at 50 mm HG  Patient received IV pain medication per bedside nurse prior to dressing change Patient tolerated procedure well  Supplies in room for next dressing change: 2 silver NPWT dressing kits, 2 pieces white foam, 1 mepitel, 2 barrier rings    WOC nurse will continue to provide NPWT dressing changes M-W-F due to the complexity of the dressing change; next change Monday 05/04/2023 with Dr. Everlene Farrier.    Thank you,    Priscella Mann MSN, RN-BC, [employer] [phone]

## 2023-04-30 NOTE — Progress Notes (Signed)
   04/30/23 0945  Spiritual Encounters  Type of Visit Follow up  Care provided to: Pt not available  Reason for visit Surgical  OnCall Visit Yes   Chaplain noticed that patient moved to ICU post surgery and wanted to provide support. Patient sleeping at time of visit.

## 2023-04-30 NOTE — Consult Note (Addendum)
Initial Consultation Note   Patient: Debra [name] WUJ:[id number] DOB: 06/24/1966 PCP: Hillery Aldo, MD DOA: 02/13/2022 DOS: the patient was seen and examined on 04/30/2023 Primary service: Leafy Ro, MD  Referring physician: Dr. Belia Heman Reason for consult: Hyperglycemia, coagulopathy  Assessment/Plan:  Hyperglycemia -Most recent A1c 6.2 indicating a condition of prediabetes at baseline -Acute phase of hyperglycemia likely secondary to stress and partially may be related to TPN. -Review 24-hour insulin administration showed 7 unit of sliding scale in total however most occasions glucose still above 200. -Will start Lantus 8 units daily -Continue sliding scale every 4 hours  Coagulopathy -Mild, chronic, review patient's record showed she has a chronic elevation of INR, at baseline 1.2-1.4 and I asked patient granddaughter, and she reported patient has no known history of easy bleeding, and denied any SLE history in the family.  CT abdomen showed no signs of liver cirrhosis or NASH -Given the patient prolonged history of intra-abdominal issues especially involving small bowel, there is concern about vitamin K depletion due to malabsorption. -1 dose of vitamin K subcu given, recommend checking INR sporadically and before surgery   Chronic iron deficiency anemia -Most recent 2 readings of iron study showed severe iron depletion, again raised the concern about malabsorption. -Will check iron study today, and may need IV iron  Tachycardia, sinus -Off pressor -Nurse reported that the patient has anasarca, Will check Echo, TSH  Anasarca -Net fluid balance >7500 ml since 08/19, patient was off levophed and BP seems stable. Appears to be overloaded, will ordered IV lasix  and check Echo   TRH will continue to follow the patient.  HPI: Debra [name] is a 57 y.o. 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 with recurrent  disease at anterior pelvis next to the anastomosis which was complicated by high output enterocutaneous fistula , and laparotomy excision of greater omental mass, abdominal wall reconstruction with [name], appendectomy and placement of Prevena vacuum on 06/08, complicated with small bowel perforation, underwent reopening of laparotomy and repair of small bowel perforation, high output enterocutaneous fistula, on chronic TPN since April this year, has had a prolonged hospital stay, complicated with right IJ Port-A-Cath Enterococcus infection which was removed and a new PICC line was placed recently.  Hospitalist team was asked to manage patient's hyperglycemia and coagulopathy.  See hospital significant hospital event below.  Right now, patient has no major complaints, and the history was provided by granddaughter at bedside as well as review of patient's most recent hospitalization record  Significant Hospital Events: Including procedures, antibiotic start and stop dates in addition to other pertinent events   02/13/22: initial laparotomy, excision of greater omental mass, abdominal wall reconstruction with [name], appendectomy, and placement of Prevena vac  02/15/22: EXPLORATORY LAPAROTOMY REPAIR OF BOWEL PERFORATION, INSERTION OF MESH, APPLICATION OF WOUND VAC 02/26/22:  COLON RESECTION SIGMOID, HERNIA REPAIR VENTRAL, APPENDECTOMY, INSERTION OF MESH, REPAIR ABDOMINAL WALL 07/15/22: Neuro surgery consultation due to L1 compression fracture. Not a candidate for lumbosacral orthotic. 12/19/22: Insertion of Port-a-cath 04/06/23: removal of port-a-cath Throughout stay patient has required TPN, WOC & ID consultation. 04/28/23: 12 hours and surgery for abdominal wall reconstruction, recurrent incisional hernia repair, partial gastrectomy, small bowel resection, repair enterotomy x 3 and wound VAC placement.  Patient extubated postop but too lethargic to maintain airway requiring  reintubation and transferred to ICU for postoperative mechanical ventilatory management. 04/29/23: Pt remains mechanically intubated FiO2 40%/PEEP 5.  Pt awake and with purposeful movement on precedex gtt.  Will perform SBT with plans for possible extubation     Review of Systems: As mentioned in the history of present illness. All other systems reviewed and are negative. Past Medical History:  Diagnosis Date   Anemia    Colon cancer (HCC) 04/2020   History of kidney stones    Hypothyroidism    Thyroid disease    Past Surgical History:  Procedure Laterality Date   ABDOMINAL WALL DEFECT REPAIR N/A 02/13/2022   Procedure: REPAIR ABDOMINAL WALL;  Surgeon: Leafy Ro, MD;  Location: [facility];  Service: General;  Laterality: N/A;   APPENDECTOMY N/A 02/13/2022   Procedure: APPENDECTOMY;  Surgeon: Leafy Ro, MD;  Location: [facility];  Service: General;  Laterality: N/A;   APPLICATION OF WOUND VAC N/A 02/15/2022   Procedure: APPLICATION OF WOUND VAC;  Surgeon: Leafy Ro, MD;  Location: [facility];  Service: General;  Laterality: N/A;  [id number]   COLON RESECTION SIGMOID N/A 02/13/2022   Procedure: COLON RESECTION SIGMOID, open, [name], PA-C to assist;  Surgeon: Leafy Ro, MD;  Location: [facility];  Service: General;  Laterality: N/A;   COLONOSCOPY WITH PROPOFOL N/A 11/21/2019   Procedure: COLONOSCOPY WITH PROPOFOL;  Surgeon: Toney Reil, MD;  Location: [facility];  Service: Gastroenterology;  Laterality: N/A;   COLONOSCOPY WITH PROPOFOL N/A 07/10/2021   Procedure: COLONOSCOPY WITH PROPOFOL;  Surgeon: Toney Reil, MD;  Location: [facility] END[facility];  Service: Gastroenterology;  Laterality: N/A;   COLONOSCOPY WITH PROPOFOL N/A 02/06/2022   Procedure: COLONOSCOPY WITH PROPOFOL;  Surgeon: Toney Reil, MD;  Location: [facility] END[facility]COPY;  Service: Gastroenterology;  Laterality: N/A;  SPANISH   CYSTOSCOPY WITH STENT PLACEMENT Left 01/30/2020   Procedure: CYSTOSCOPY WITH  STENT PLACEMENT;  Surgeon: Vanna Scotland, MD;  Location: [facility];  Service: Urology;  Laterality: Left;   ESOPHAGOGASTRODUODENOSCOPY (EGD) WITH PROPOFOL N/A 11/21/2019   Procedure: ESOPHAGOGASTRODUODENOSCOPY (EGD) WITH PROPOFOL;  Surgeon: Toney Reil, MD;  Location: [facility];  Service: Gastroenterology;  Laterality: N/A;   INSERTION OF MESH N/A 02/13/2022   Procedure: INSERTION OF MESH;  Surgeon: Leafy Ro, MD;  Location: [facility];  Service: General;  Laterality: N/A;   INSERTION OF MESH N/A 02/15/2022   Procedure: INSERTION OF MESH;  Surgeon: Leafy Ro, MD;  Location: [facility];  Service: General;  Laterality: N/A;   IR RADIOLOGIST EVAL & MGMT  07/03/2020   IR RADIOLOGIST EVAL & MGMT  07/12/2020   LAPAROTOMY N/A 02/15/2022   Procedure: EXPLORATORY LAPAROTOMY REPAIR OF BOWEL PERFORATION;  Surgeon: Leafy Ro, MD;  Location: [facility];  Service: General;  Laterality: N/A;   PARTIAL COLECTOMY N/A 01/30/2020   Procedure: PARTIAL COLECTOMY Sigmoid;  Surgeon: Duanne Guess, MD;  Location: [facility];  Service: General;  Laterality: N/A;   PORTACATH PLACEMENT Left 03/28/2020   Procedure: INSERTION PORT-A-CATH;  Surgeon: Duanne Guess, MD;  Location: [facility];  Service: General;  Laterality: Left;   PORTACATH PLACEMENT N/A 08/29/2020   Procedure: INSERTION PORT-A-CATH, chemotherapy;  Surgeon: Duanne Guess, MD;  Location: [facility];  Service: General;  Laterality: N/A;   PORTACATH PLACEMENT N/A 12/19/2022   Procedure: INSERTION PORT-A-CATH;  Surgeon: Leafy Ro, MD;  Location: [facility];  Service: General;  Laterality: N/A;   VENTRAL HERNIA REPAIR N/A 02/13/2022   Procedure: HERNIA REPAIR VENTRAL ADULT, open, [name], PA-C to assist;  Surgeon: Leafy Ro, MD;  Location: [facility];  Service:  General;  Laterality: N/A;   Social History:  reports that she has never smoked. She has never used smokeless tobacco. She reports that she does not drink alcohol and does not  use drugs.  No Known Allergies  Family History  Problem Relation Age of Onset   Diabetes Sister     Prior to Admission medications   Medication Sig Start Date End Date Taking? Authorizing Provider  metroNIDAZOLE (FLAGYL) 500 MG tablet Take 2 tabs (1000 mg total) at 8 am, again at 2 pm, and again at 8 pm the day prior to surgery. 02/10/22  Yes Pabon, Diego F, MD  neomycin (MYCIFRADIN) 500 MG tablet Take 2 tabs (1000 mg total) at 8 am, again at 2 pm, and again at 8 pm the day prior to surgery. 02/10/22  Yes Pabon, Diego F, MD  polyethylene glycol powder (MIRALAX) 17 GM/SCOOP powder Mix with 64 ounces of Gatorade (no red) the day prior to surgery and drink as directed. 02/10/22  Yes Pabon, Diego F, MD  docusate sodium (COLACE) 100 MG capsule Take 1 capsule (100 mg total) by mouth daily. 05/18/20   Rickard Patience, MD  levothyroxine (SYNTHROID) 137 MCG tablet Take 137 mcg by mouth daily before breakfast.     [provider]  polyethylene glycol (MIRALAX) 17 g packet Take 17 g by mouth daily as needed. 10/29/20   Rickard Patience, MD  prochlorperazine (COMPAZINE) 10 MG tablet Take 1 tablet (10 mg total) by mouth every 6 (six) hours as needed (Nausea or vomiting). 03/08/20 02/22/22  Rickard Patience, MD    Physical Exam: Vitals:   04/30/23 0630 04/30/23 0700 04/30/23 0743 04/30/23 0800  BP: 122/67 135/73 (!) 147/76 (!) 153/81  Pulse: (!) 112 (!) 114 (!) 118 (!) 116  Resp: 15 15 14 15   Temp:   98.2 F (36.8 C)   TempSrc:   Oral   SpO2: 95% 97% 91% 91%  Weight:      Height:       Subjective:  Eyes: PERRL, lids and conjunctivae normal.  Chronic ill appearance ENMT: Mucous membranes are moist. Posterior pharynx clear of any exudate or lesions.Normal dentition.  Neck: normal, supple, no masses, no thyromegaly Respiratory: clear to auscultation bilaterally, no wheezing, no crackles. Normal respiratory effort. No accessory muscle use.  Cardiovascular: Regular rate and rhythm, no murmurs / rubs / gallops. 2+ edema.  2+ pedal pulses. No carotid bruits.  Abdomen: Surgical wound covered with dressing, no bleeding no discharge, mild tenderness bowel sounds presented. No hepatosplenomegaly. Bowel sounds positive.  Musculoskeletal: no clubbing / cyanosis. No joint deformity upper and lower extremities. Good ROM, no contractures. Normal muscle tone.  Skin: no rashes, lesions, ulcers. No induration Neurologic: CN 2-12 grossly intact. Sensation intact, DTR normal.  Slight weakness on left side compared to right side Psychiatric: Normal judgment and insight. Alert and oriented x 3. Normal mood.   Data Reviewed:  Past labs CT abdomen pelvis and surgical record reviewed  Family Communication: Granddaughter at bedside Primary team communication: Surgical team and ICU team Thank you very much for involving [name] in the care of your patient.  Author: Emeline General, MD 04/30/2023 8:36 AM  For on call review [email or web address].

## 2023-05-01 ENCOUNTER — Encounter: Payer: Self-pay | Admitting: Surgery

## 2023-05-01 ENCOUNTER — Inpatient Hospital Stay: Payer: Medicaid Other

## 2023-05-01 LAB — TYPE AND SCREEN
ABO/RH(D): O POS
Antibody Screen: NEGATIVE
Unit division: 0
Unit division: 0
Unit division: 0
Unit division: 0
Unit division: 0
Unit division: 0

## 2023-05-01 LAB — BPAM PLATELET PHERESIS
Blood Product Expiration Date: 202408222359
Blood Product Expiration Date: 202408232359
ISSUE DATE / TIME: 202408212332
ISSUE DATE / TIME: 202408220454
Unit Type and Rh: 5100
Unit Type and Rh: 7300

## 2023-05-01 LAB — BPAM RBC
Blood Product Expiration Date: 202409062359
Blood Product Expiration Date: 202409062359
Blood Product Expiration Date: 202409072359
Blood Product Expiration Date: 202409072359
Blood Product Expiration Date: 202409112359
Blood Product Expiration Date: 202409132359
ISSUE DATE / TIME: 202408200321
ISSUE DATE / TIME: 202408200858
ISSUE DATE / TIME: 202408201226
ISSUE DATE / TIME: 202408201439
ISSUE DATE / TIME: 202408201823
ISSUE DATE / TIME: 202408212050
Unit Type and Rh: 5100
Unit Type and Rh: 5100
Unit Type and Rh: 5100
Unit Type and Rh: 5100
Unit Type and Rh: 5100
Unit Type and Rh: 5100

## 2023-05-01 LAB — CBC
HCT: 25.3 % — ABNORMAL LOW (ref 36.0–46.0)
Hemoglobin: 8.1 g/dL — ABNORMAL LOW (ref 12.0–15.0)
MCH: 28.9 pg (ref 26.0–34.0)
MCHC: 32 g/dL (ref 30.0–36.0)
MCV: 90.4 fL (ref 80.0–100.0)
Platelets: 54 10*3/uL — ABNORMAL LOW (ref 150–400)
RBC: 2.8 MIL/uL — ABNORMAL LOW (ref 3.87–5.11)
RDW: 21.5 % — ABNORMAL HIGH (ref 11.5–15.5)
WBC: 8.6 10*3/uL (ref 4.0–10.5)
nRBC: 2.9 % — ABNORMAL HIGH (ref 0.0–0.2)

## 2023-05-01 LAB — GLUCOSE, CAPILLARY
Glucose-Capillary: 206 mg/dL — ABNORMAL HIGH (ref 70–99)
Glucose-Capillary: 225 mg/dL — ABNORMAL HIGH (ref 70–99)
Glucose-Capillary: 247 mg/dL — ABNORMAL HIGH (ref 70–99)
Glucose-Capillary: 248 mg/dL — ABNORMAL HIGH (ref 70–99)
Glucose-Capillary: 261 mg/dL — ABNORMAL HIGH (ref 70–99)
Glucose-Capillary: 262 mg/dL — ABNORMAL HIGH (ref 70–99)
Glucose-Capillary: 281 mg/dL — ABNORMAL HIGH (ref 70–99)
Glucose-Capillary: >600 mg/dL (ref 70–99)

## 2023-05-01 LAB — PREPARE PLATELET PHERESIS
Unit division: 0
Unit division: 0

## 2023-05-01 LAB — BPAM FFP
Blood Product Expiration Date: 202408252359
ISSUE DATE / TIME: 202408210035
Unit Type and Rh: 6200

## 2023-05-01 LAB — COMPREHENSIVE METABOLIC PANEL
ALT: 49 U/L — ABNORMAL HIGH (ref 0–44)
AST: 52 U/L — ABNORMAL HIGH (ref 15–41)
Albumin: 2.2 g/dL — ABNORMAL LOW (ref 3.5–5.0)
Alkaline Phosphatase: 117 U/L (ref 38–126)
Anion gap: 8 (ref 5–15)
BUN: 49 mg/dL — ABNORMAL HIGH (ref 6–20)
CO2: 26 mmol/L (ref 22–32)
Calcium: 7.8 mg/dL — ABNORMAL LOW (ref 8.9–10.3)
Chloride: 110 mmol/L (ref 98–111)
Creatinine, Ser: 1.39 mg/dL — ABNORMAL HIGH (ref 0.44–1.00)
GFR, Estimated: 44 mL/min — ABNORMAL LOW (ref >60)
Glucose, Bld: 286 mg/dL — ABNORMAL HIGH (ref 70–99)
Potassium: 4.7 mmol/L (ref 3.5–5.1)
Sodium: 144 mmol/L (ref 135–145)
Total Bilirubin: 2.2 mg/dL — ABNORMAL HIGH (ref 0.3–1.2)
Total Protein: 4.8 g/dL — ABNORMAL LOW (ref 6.5–8.1)

## 2023-05-01 LAB — MAGNESIUM: Magnesium: 2.4 mg/dL (ref 1.7–2.4)

## 2023-05-01 LAB — PREPARE FRESH FROZEN PLASMA: Unit division: 0

## 2023-05-01 LAB — VITAMIN D 25 HYDROXY (VIT D DEFICIENCY, FRACTURES): Vit D, 25-Hydroxy: 35.99 ng/mL (ref 30–100)

## 2023-05-01 LAB — PROTIME-INR
INR: 1.6 — ABNORMAL HIGH (ref 0.8–1.2)
Prothrombin Time: 19 s — ABNORMAL HIGH (ref 11.4–15.2)

## 2023-05-01 LAB — PROCALCITONIN: Procalcitonin: 5.51 ng/mL

## 2023-05-01 LAB — BRAIN NATRIURETIC PEPTIDE: B Natriuretic Peptide: 405 pg/mL — ABNORMAL HIGH (ref 0.0–100.0)

## 2023-05-01 MED ORDER — INSULIN ASPART 100 UNIT/ML IJ SOLN
0.0000 [IU] | INTRAMUSCULAR | Status: DC
  Administered 2023-05-01: 7 [IU] via SUBCUTANEOUS
  Administered 2023-05-01: 11 [IU] via SUBCUTANEOUS
  Administered 2023-05-01: 7 [IU] via SUBCUTANEOUS
  Administered 2023-05-01: 11 [IU] via SUBCUTANEOUS
  Administered 2023-05-01 – 2023-05-02 (×2): 7 [IU] via SUBCUTANEOUS
  Administered 2023-05-02: 11 [IU] via SUBCUTANEOUS
  Administered 2023-05-02: 7 [IU] via SUBCUTANEOUS
  Administered 2023-05-02 (×3): 11 [IU] via SUBCUTANEOUS
  Administered 2023-05-03: 7 [IU] via SUBCUTANEOUS
  Administered 2023-05-03: 15 [IU] via SUBCUTANEOUS
  Administered 2023-05-03: 3 [IU] via SUBCUTANEOUS
  Administered 2023-05-03 (×2): 7 [IU] via SUBCUTANEOUS
  Administered 2023-05-03: 4 [IU] via SUBCUTANEOUS
  Administered 2023-05-03 – 2023-05-04 (×2): 7 [IU] via SUBCUTANEOUS
  Administered 2023-05-04 – 2023-05-05 (×5): 3 [IU] via SUBCUTANEOUS
  Administered 2023-05-05: 4 [IU] via SUBCUTANEOUS
  Administered 2023-05-05 (×2): 3 [IU] via SUBCUTANEOUS
  Administered 2023-05-05 – 2023-05-06 (×2): 4 [IU] via SUBCUTANEOUS
  Administered 2023-05-06: 3 [IU] via SUBCUTANEOUS
  Administered 2023-05-06: 4 [IU] via SUBCUTANEOUS
  Administered 2023-05-06 – 2023-05-08 (×6): 3 [IU] via SUBCUTANEOUS
  Administered 2023-05-08 (×2): 4 [IU] via SUBCUTANEOUS
  Administered 2023-05-08: 3 [IU] via SUBCUTANEOUS
  Administered 2023-05-08: 4 [IU] via SUBCUTANEOUS
  Administered 2023-05-09 (×2): 3 [IU] via SUBCUTANEOUS
  Administered 2023-05-09: 4 [IU] via SUBCUTANEOUS
  Administered 2023-05-09 (×2): 3 [IU] via SUBCUTANEOUS
  Administered 2023-05-09 – 2023-05-10 (×4): 4 [IU] via SUBCUTANEOUS
  Administered 2023-05-10: 3 [IU] via SUBCUTANEOUS
  Administered 2023-05-10 – 2023-05-11 (×4): 4 [IU] via SUBCUTANEOUS
  Administered 2023-05-11 (×2): 3 [IU] via SUBCUTANEOUS
  Administered 2023-05-12: 7 [IU] via SUBCUTANEOUS
  Administered 2023-05-12 (×2): 4 [IU] via SUBCUTANEOUS
  Administered 2023-05-12 (×2): 7 [IU] via SUBCUTANEOUS
  Administered 2023-05-12 – 2023-05-13 (×3): 4 [IU] via SUBCUTANEOUS
  Administered 2023-05-13 – 2023-05-14 (×2): 7 [IU] via SUBCUTANEOUS
  Administered 2023-05-14: 11 [IU] via SUBCUTANEOUS
  Administered 2023-05-14: 7 [IU] via SUBCUTANEOUS
  Filled 2023-05-01 (×67): qty 1

## 2023-05-01 MED ORDER — TRACE MINERALS CU-MN-SE-ZN 300-55-60-3000 MCG/ML IV SOLN
INTRAVENOUS | Status: AC
  Filled 2023-05-01: qty 729.6

## 2023-05-01 MED ORDER — LIDOCAINE 5 % EX PTCH
2.0000 | MEDICATED_PATCH | CUTANEOUS | Status: DC
  Administered 2023-05-01 – 2023-05-13 (×12): 2 via TRANSDERMAL
  Filled 2023-05-01 (×18): qty 2

## 2023-05-01 MED ORDER — AZITHROMYCIN 250 MG PO TABS: 500.0000 mg | ORAL_TABLET | Freq: Every day | ORAL | Status: DC

## 2023-05-01 MED ORDER — INSULIN ASPART 100 UNIT/ML IJ SOLN: 0.0000 [IU] | INTRAMUSCULAR | Status: DC

## 2023-05-01 MED ORDER — SODIUM CHLORIDE 0.9 % IV SOLN
500.0000 mg | INTRAVENOUS | Status: DC
  Administered 2023-05-01 – 2023-05-02 (×2): 500 mg via INTRAVENOUS
  Filled 2023-05-01 (×2): qty 5

## 2023-05-01 MED ORDER — ALBUMIN HUMAN 5 % IV SOLN
12.5000 g | Freq: Once | INTRAVENOUS | Status: AC
  Administered 2023-05-01: 12.5 g via INTRAVENOUS
  Filled 2023-05-01: qty 250

## 2023-05-01 MED ORDER — SODIUM CHLORIDE 0.9 % IV SOLN
2.0000 g | INTRAVENOUS | Status: DC
  Administered 2023-05-01 – 2023-05-02 (×2): 2 g via INTRAVENOUS
  Filled 2023-05-01 (×2): qty 20

## 2023-05-01 MED ORDER — FUROSEMIDE 10 MG/ML IJ SOLN
20.0000 mg | Freq: Two times a day (BID) | INTRAMUSCULAR | Status: DC
  Administered 2023-05-01 – 2023-05-02 (×2): 20 mg via INTRAVENOUS
  Filled 2023-05-01 (×2): qty 2

## 2023-05-01 NOTE — Progress Notes (Signed)
PHARMACY - TOTAL PARENTERAL NUTRITION CONSULT NOTE   Indication: Prolonged ileus  Patient Measurements: Height: 4\' 11"  (149.9 cm) Weight: 121.7 kg (268 lb 4.8 oz) IBW/kg (Calculated) : 43.2 TPN AdjBW (KG): 59 Body mass index is 54.19 kg/m.  Assessment: Danyka Ladrena Ericksen is a 57 y.o. female s/p laparotomy, excision of greater omental mass, abdominal wall reconstruction with [name], appendectomy, and placement of Prevena vac.  Glucose / Insulin: Transitioned to SSI q4h + semglee 10 units Insulin usage last 24h: 30 units Electrolytes: wnl Renal: SCr stable at baseline Hepatic: AST / ALT slightly elevated  TG: 8/19: 166, stable GI Imaging: 9/11 CTAP: no new acute issues GI Surgeries / Procedures:  --s/p laparotomy, excision of greater omental mass, abdominal wall reconstruction with [name], appendectomy, and placement of Prevena vac 12/19/22 placement of right IJ port-a-catheter with dual reservoirs also s/p re-opening of laparotomy for repair of small bowel perforation  --08/20  exploratory laparotomy, extensive lysis of adhesion, 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, partial gastrectomy, abdominal wall reconstruction with mesh placement, and wound vac placement   Central access: PICC 04/09/2023   TPN start date: 04/09/23    (was on TPN from 02/15/22 to 04/05/23- held for bacteremia and TPN restarted 04/09/23)  RD Assessment:  Estimated Needs Total Energy Estimated Needs: 1900-2200kcal/day Total Protein Estimated Needs: 95-110g/day Total Fluid Estimated Needs: 1.4-1.6L/day  Current Nutrition: NPO  Plan:  ---Continue continuous TPN at 75mL/hr at 1800 Electrolytes in TPN (standard): Na 34mEq/L, K 50mEq/L, Ca 30mEq/L, Mg 67mEq/L, and Phos 22mmol/L. Cl:Ac 1:1 ---Re-adding multivitamin, trace elements and chromium chloride to TPN ---Due to hyperglycemic readings and insulin needs, will add 10 units of insulin to TPN  ---Continue SSI  0-9 q4h + semglee 10 units(increased on 8/23) ---Monitor TPN labs on Mon/Thurs, more frequently until stable   Bettey Costa, PharmD Clinical Pharmacist 05/01/2023 10:53 AM

## 2023-05-01 NOTE — Plan of Care (Signed)
Patient oriented to person only, stated that she was in [place] and year was 2021. Able to follow simple commands. Family remained at bedside throughout the shift and asked if patient could have nuts. Informed niece that patient is not to have anything by mouth. Labs are skewed this morning, pharmacy contacted to inquire about re-draw since patient is on TPN, awaiting day shift pharmacist arrival. All VSS throughout the shift. Patient NGT drainage now appears dark red/brown vs clear bile at midnight. No acute events this shift.

## 2023-05-01 NOTE — Plan of Care (Signed)
  Problem: Clinical Measurements: Goal: Ability to maintain clinical measurements within normal limits will improve Outcome: Progressing Goal: Will remain free from infection Outcome: Progressing Goal: Diagnostic test results will improve Outcome: Progressing Goal: Respiratory complications will improve Outcome: Progressing   Problem: Clinical Measurements: Goal: Will remain free from infection Outcome: Progressing   Problem: Nutrition: Goal: Adequate nutrition will be maintained Outcome: Progressing   Problem: Activity: Goal: Risk for activity intolerance will decrease Outcome: Progressing   Problem: Nutrition: Goal: Adequate nutrition will be maintained Outcome: Progressing   Problem: Coping: Goal: Level of anxiety will decrease Outcome: Progressing   Problem: Pain Managment: Goal: General experience of comfort will improve Outcome: Progressing   Problem: Activity: Goal: Ability to tolerate increased activity will improve Outcome: Progressing   Problem: Respiratory: Goal: Ability to maintain a clear airway and adequate ventilation will improve Outcome: Progressing   Problem: Role Relationship: Goal: Method of communication will improve Outcome: Progressing   Problem: Education: Goal: Ability to describe self-care measures that may prevent or decrease complications (Diabetes Survival Skills Education) will improve Outcome: Progressing   Problem: Cardiac: Goal: Ability to maintain an adequate cardiac output will improve Outcome: Progressing   Problem: Health Behavior/Discharge Planning: Goal: Ability to identify and utilize available resources and services will improve Outcome: Progressing Goal: Ability to manage health-related needs will improve Outcome: Progressing   Problem: Fluid Volume: Goal: Ability to achieve a balanced intake and output will improve Outcome: Progressing   Problem: Metabolic: Goal: Ability to maintain appropriate glucose levels  will improve Outcome: Progressing   Problem: Nutritional: Goal: Maintenance of adequate nutrition will improve Outcome: Progressing Goal: Maintenance of adequate weight for body size and type will improve Outcome: Progressing   Problem: Respiratory: Goal: Will regain and/or maintain adequate ventilation Outcome: Progressing   Problem: Urinary Elimination: Goal: Ability to achieve and maintain adequate renal perfusion and functioning will improve Outcome: Progressing

## 2023-05-01 NOTE — Progress Notes (Signed)
CC: POD # 3 Subjective: Some confusion Febrile last night Drains and wound vac still serous Mild bump in creat Good UO, responded to lasix Still mildly coagulopathic and thrombocytopenic No evidence of active bleeding  Objective: Vital signs in last 24 hours: Temp:  [98.1 F (36.7 C)-101.8 F (38.8 C)] 98.4 F (36.9 C) (08/23 0800) Pulse Rate:  [89-118] 99 (08/23 0600) Resp:  [9-26] 13 (08/23 0600) BP: (99-179)/(58-156) 139/68 (08/23 0600) SpO2:  [83 %-99 %] 99 % (08/23 0600) Weight:  [121.7 kg] 121.7 kg (08/23 0500) Last BM Date : 04/27/23  Intake/Output from previous day: 08/22 0701 - 08/23 0700 In: 3232 [I.V.:2057.4; Blood:311; IV Piggyback:863.6] Out: 3537 [Urine:2235; Emesis/NG output:120; Drains:1182] Intake/Output this shift: Total I/O In: 0  Out: 425 [Urine:325; Drains:100]  Physical exam:  NAD, on O2 Chest: decrease bs ABd: wound vac in place, soft, serous fluid from drains and JPs  Lab Results: CBC  Recent Labs    04/30/23 0830 05/01/23 0352  WBC 6.0 8.6  HGB 8.4* 8.1*  HCT 25.0* 25.3*  PLT 62* 54*   BMET Recent Labs    04/30/23 0401 05/01/23 0352  NA 142 144  K 4.2 4.7  CL 110 110  CO2 26 26  GLUCOSE 275* 286*  BUN 35* 49*  CREATININE 0.82 1.39*  CALCIUM 7.9* 7.8*   PT/INR Recent Labs    04/30/23 0508 05/01/23 0352  LABPROT 19.2* 19.0*  INR 1.6* 1.6*   ABG Recent Labs    04/28/23 2142 04/29/23 0005  PHART 7.18* 7.31*  HCO3 19.0* 22.2    Studies/Results: DG Chest 1 View  Result Date: 05/01/2023 CLINICAL DATA:  [id number] with congestive heart failure. EXAM: CHEST  1 VIEW COMPARISON:  Portable chest yesterday at 9:16 p.m. FINDINGS: 6:15 a.m. NGT terminates in the distal gastric body. Right PICC tip is in the right atrium. Stable cardiomegaly but with increased central vascular engorgement. No significant interstitial edema. Small left pleural effusion continues to be seen with left lower lobe patchy consolidation. There is a low  inspiration. Increased haziness in the perihilar areas could be due to low inspiration, pneumonia or edema. IMPRESSION: 1. Increased central vascular engorgement without significant interstitial edema. 2. Increased haziness in the perihilar areas could be due to low inspiration, pneumonia or ground-glass edema. 3. Stable small left pleural effusion with left lower lobe consolidation. Electronically Signed   By: Almira Bar M.D.   On: 05/01/2023 07:26   DG Chest Port 1 View  Result Date: 04/30/2023 CLINICAL DATA:  Fever EXAM: PORTABLE CHEST 1 VIEW COMPARISON:  04/30/2023 FINDINGS: Nasogastric tube extends into the upper abdomen beyond the margin examination. Right upper extremity PICC line tip is seen within the deep right atrium, unchanged from prior examination. Lung volumes are small. Opacification of the left hemithorax has improved suggesting decrease of a posterior layering pleural effusion. Retrocardiac opacification persists in keeping with atelectasis, infiltrate, or posteriorly layering pleural fluid in this location. Right lung is clear. No pneumothorax. No pleural effusion on the right. Stable cardiomegaly. IMPRESSION: 1. Improved aeration of the left hemithorax suggesting decrease of a posterior layering pleural effusion. Persistent retrocardiac opacification in keeping with atelectasis, infiltrate, or posteriorly layering pleural fluid in this location. 2. Pulmonary hypoinflation. 3. Stable cardiomegaly Electronically Signed   By: Helyn Numbers M.D.   On: 04/30/2023 23:55   DG Chest 1 View  Result Date: 04/30/2023 CLINICAL DATA:  Congestive heart failure. EXAM: CHEST  1 VIEW COMPARISON:  04/28/2023. FINDINGS: Interval removal of the  endotracheal tube. Enteric tube courses below the diaphragm, beyond the field of view. Unchanged right upper extremity PICC with tip projecting over the right atrium. Increased opacity of the left hemithorax, which may represent increasing left pleural effusion  and/or atelectasis or infection. No pneumothorax. IMPRESSION: Increased opacity of the left hemithorax, which may represent increasing left pleural effusion and/or atelectasis or infection. Electronically Signed   By: Orvan Falconer M.D.   On: 04/30/2023 15:46   ECHOCARDIOGRAM COMPLETE  Result Date: 04/30/2023    ECHOCARDIOGRAM REPORT   Patient Name:   Debra [name] Date of Exam: 04/30/2023 Medical Rec #:  [id number]            Height:       59.0 in Accession #:    [id number]           Weight:       268.3 lb Date of Birth:  [birth year]            BSA:          2.090 m Patient Age:    57 years             BP:           141/79 mmHg Patient Gender: F                    HR:           79 bpm. Exam Location:  [facility] Procedure: 2D Echo, Cardiac Doppler, Color Doppler and Intracardiac            Opacification Agent Indications:     CHF  History:         Patient has no prior history of Echocardiogram examinations.                  CHF. Colon CA.  Sonographer:     Mikki Harbor Referring Phys:  [id number] Emeline General Diagnosing Phys: Julien Nordmann MD  Sonographer Comments: Technically difficult study due to poor echo windows and patient is obese. IMPRESSIONS  1. Left ventricular ejection fraction, by estimation, is 60 to 65%. The left ventricle has normal function. The left ventricle has no regional wall motion abnormalities. Left ventricular diastolic parameters are consistent with Grade I diastolic dysfunction (impaired relaxation).  2. Right ventricular systolic function is normal. The right ventricular size is normal.  3. The mitral valve is normal in structure. No evidence of mitral valve regurgitation. No evidence of mitral stenosis.  4. The aortic valve has an indeterminant number of cusps. Aortic valve regurgitation is not visualized. No aortic stenosis is present.  5. The inferior vena cava is normal in size with greater than 50% respiratory variability, suggesting right atrial pressure of 3 mmHg. FINDINGS   Left Ventricle: Left ventricular ejection fraction, by estimation, is 60 to 65%. The left ventricle has normal function. The left ventricle has no regional wall motion abnormalities. Definity contrast agent was given IV to delineate the left ventricular  endocardial borders. The left ventricular internal cavity size was normal in size. There is no left ventricular hypertrophy. Left ventricular diastolic parameters are consistent with Grade I diastolic dysfunction (impaired relaxation). Right Ventricle: The right ventricular size is normal. No increase in right ventricular wall thickness. Right ventricular systolic function is normal. Left Atrium: Left atrial size was normal in size. Right Atrium: Right atrial size was normal in size. Pericardium: There is no evidence of pericardial effusion. Mitral Valve: The mitral valve is normal  in structure. No evidence of mitral valve regurgitation. No evidence of mitral valve stenosis. MV peak gradient, 5.2 mmHg. The mean mitral valve gradient is 3.0 mmHg. Tricuspid Valve: The tricuspid valve is normal in structure. Tricuspid valve regurgitation is not demonstrated. No evidence of tricuspid stenosis. Aortic Valve: The aortic valve has an indeterminant number of cusps. Aortic valve regurgitation is not visualized. No aortic stenosis is present. Aortic valve mean gradient measures 3.0 mmHg. Aortic valve peak gradient measures 6.9 mmHg. Aortic valve area, by VTI measures 2.05 cm. Pulmonic Valve: The pulmonic valve was normal in structure. Pulmonic valve regurgitation is not visualized. No evidence of pulmonic stenosis. Aorta: The aortic root is normal in size and structure. Venous: The inferior vena cava is normal in size with greater than 50% respiratory variability, suggesting right atrial pressure of 3 mmHg. IAS/Shunts: No atrial level shunt detected by color flow Doppler.  LEFT VENTRICLE PLAX 2D LVIDd:         4.30 cm   Diastology LVIDs:         2.60 cm   LV e' medial:     8.92 cm/s LV PW:         1.00 cm   LV E/e' medial:  11.4 LV IVS:        1.10 cm   LV e' lateral:   9.14 cm/s LVOT diam:     2.00 cm   LV E/e' lateral: 11.2 LV SV:         49 LV SV Index:   23 LVOT Area:     3.14 cm  LEFT ATRIUM           Index LA diam:      3.20 cm 1.53 cm/m LA Vol (A4C): 35.0 ml 16.75 ml/m  AORTIC VALVE                    PULMONIC VALVE AV Area (Vmax):    2.30 cm     PV Vmax:       1.24 m/s AV Area (Vmean):   2.13 cm     PV Peak grad:  6.2 mmHg AV Area (VTI):     2.05 cm AV Vmax:           131.00 cm/s AV Vmean:          81.700 cm/s AV VTI:            0.237 m AV Peak Grad:      6.9 mmHg AV Mean Grad:      3.0 mmHg LVOT Vmax:         95.80 cm/s LVOT Vmean:        55.400 cm/s LVOT VTI:          0.155 m LVOT/AV VTI ratio: 0.65  AORTA Ao Root diam: 2.50 cm Ao Asc diam:  2.50 cm MITRAL VALVE MV Area (PHT): 4.96 cm     SHUNTS MV Area VTI:   1.93 cm     Systemic VTI:  0.16 m MV Peak grad:  5.2 mmHg     Systemic Diam: 2.00 cm MV Mean grad:  3.0 mmHg MV Vmax:       1.14 m/s MV Vmean:      76.4 cm/s MV Decel Time: 153 msec MV E velocity: 102.00 cm/s MV A velocity: 108.00 cm/s MV E/A ratio:  0.94 Julien Nordmann MD Electronically signed by Julien Nordmann MD Signature Date/Time: 04/30/2023/1:54:41 PM    Final     Anti-infectives:  Anti-infectives (From admission, onward)    Start     Dose/Rate Route Frequency Ordered Stop   04/28/23 2200  cefoTEtan (CEFOTAN) 2 g in sodium chloride 0.9 % 100 mL IVPB        2 g 200 mL/hr over 30 Minutes Intravenous Every 8 hours 04/28/23 2058 04/29/23 1717   04/27/23 0945  cefoTEtan (CEFOTAN) 2 g in sodium chloride 0.9 % 100 mL IVPB        2 g 200 mL/hr over 30 Minutes Intravenous On call to O.R. 04/27/23 0856 04/28/23 0559   04/05/23 0900  cefTRIAXone (ROCEPHIN) 2 g in sodium chloride 0.9 % 100 mL IVPB        2 g 200 mL/hr over 30 Minutes Intravenous Every 24 hours 04/05/23 0634 04/14/23 1254   12/19/22 1000  ceFAZolin (ANCEF) IVPB 2g/100 mL premix        2  g 200 mL/hr over 30 Minutes Intravenous  Once 12/18/22 1738 12/19/22 1141   09/17/22 1200  vancomycin (VANCOREADY) IVPB 1250 mg/250 mL  Status:  Discontinued        1,250 mg 166.7 mL/hr over 90 Minutes Intravenous Every 24 hours 09/16/22 1040 09/22/22 1139   09/16/22 1200  vancomycin (VANCOREADY) IVPB 1250 mg/250 mL        1,250 mg 166.7 mL/hr over 90 Minutes Intravenous  Once 09/15/22 1706 09/16/22 1255   09/15/22 1200  ceFEPIme (MAXIPIME) 2 g in sodium chloride 0.9 % 100 mL IVPB  Status:  Discontinued        2 g 200 mL/hr over 30 Minutes Intravenous Every 8 hours 09/15/22 1024 09/18/22 1026   09/15/22 1200  vancomycin (VANCOREADY) IVPB 2000 mg/400 mL        2,000 mg 200 mL/hr over 120 Minutes Intravenous  Once 09/15/22 1024 09/15/22 1500   03/04/22 2200  piperacillin-tazobactam (ZOSYN) IVPB 3.375 g        3.375 g 12.5 mL/hr over 240 Minutes Intravenous Every 8 hours 03/04/22 2005 03/18/22 0248   02/21/22 1500  vancomycin (VANCOREADY) IVPB 1250 mg/250 mL  Status:  Discontinued        1,250 mg 166.7 mL/hr over 90 Minutes Intravenous Every 24 hours 02/21/22 1111 02/24/22 1336   02/21/22 1400  meropenem (MERREM) 1 g in sodium chloride 0.9 % 100 mL IVPB        1 g 200 mL/hr over 30 Minutes Intravenous Every 8 hours 02/21/22 1258 02/27/22 2157   02/20/22 1500  vancomycin (VANCOCIN) IVPB 1000 mg/200 mL premix  Status:  Discontinued        1,000 mg 200 mL/hr over 60 Minutes Intravenous Every 24 hours 02/19/22 1706 02/21/22 1111   02/19/22 1115  vancomycin (VANCOREADY) IVPB 2000 mg/400 mL        2,000 mg 200 mL/hr over 120 Minutes Intravenous  Once 02/19/22 1025 02/19/22 1732   02/19/22 1115  fluconazole (DIFLUCAN) IVPB 400 mg        400 mg 100 mL/hr over 120 Minutes Intravenous Every 24 hours 02/19/22 1025 03/04/22 1301   02/15/22 0730  piperacillin-tazobactam (ZOSYN) IVPB 3.375 g  Status:  Discontinued        3.375 g 12.5 mL/hr over 240 Minutes Intravenous Every 8 hours 02/15/22 0639  02/21/22 1235   02/15/22 0655  piperacillin-tazobactam (ZOSYN) 3.375 GM/50ML IVPB       Note to Pharmacy: Loretha Stapler N: cabinet override      02/15/22 0655 02/15/22 0816   02/13/22 2200  cefoTEtan (CEFOTAN) 2  g in sodium chloride 0.9 % 100 mL IVPB  Status:  Discontinued        2 g 200 mL/hr over 30 Minutes Intravenous Every 12 hours 02/13/22 1541 02/15/22 0639   02/13/22 1345  cefoTEtan (CEFOTAN) 2 g in sodium chloride 0.9 % 100 mL IVPB        2 g 200 mL/hr over 30 Minutes Intravenous  Once 02/13/22 1331 02/13/22 2146   02/13/22 1336  sodium chloride 0.9 % with cefoTEtan (CEFOTAN) ADS Med       Note to Pharmacy: Gates Rigg: cabinet override      02/13/22 1336 02/14/22 0144   02/13/22 0620  sodium chloride 0.9 % with cefoTEtan (CEFOTAN) ADS Med       Note to Pharmacy: Agnes Lawrence A: cabinet override      02/13/22 0620 02/13/22 0802   02/13/22 0600  cefoTEtan (CEFOTAN) 2 g in sodium chloride 0.9 % 100 mL IVPB        2 g 200 mL/hr over 30 Minutes Intravenous On call to O.R. 02/12/22 2229 02/13/22 2146       Assessment/Plan: Doing ok Still needing step down monitoring Wondering if she is developing pneumonia Appreciate hospitalist assistance Decrease lasix to 20 IV BID Continue IV tylenol Daily labs No need for surgical intervention   Sterling Big, MD, FACS  05/01/2023

## 2023-05-01 NOTE — Progress Notes (Signed)
       CROSS COVER NOTE  NAME: Caycee Matthias MRN: [id number] DOB : [birth year]    Concern as stated by nurse / staff   N/A     Pertinent findings on chart review: Long time inpatient with complex recovery  57 y.o. 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

## 2023-05-01 NOTE — Progress Notes (Addendum)
PROGRESS NOTE    Debra [name]   VHQ:[id number] DOB: September 07, 1966  DOA: 02/13/2022 Date of Service: 05/01/23 PCP: Hillery Aldo, MD     Brief Narrative / Hospital Course:  Debra [name] is a 57 y.o. 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 with recurrent disease at anterior pelvis next to the anastomosis which was complicated by high output enterocutaneous fistula , and laparotomy excision of greater omental mass, abdominal wall reconstruction with [name], appendectomy and placement of Prevena vacuum on 06/08, complicated with small bowel perforation, underwent reopening of laparotomy and repair of small bowel perforation, high output enterocutaneous fistula, on chronic TPN since April this year, has had a prolonged hospital stay, complicated with right IJ Port-A-Cath Enterococcus infection which was removed and a new PICC line was placed recently.  Hospitalist team was asked to manage patient's hyperglycemia and coagulopathy.    Significant hospital events:  02/13/22: initial laparotomy, excision of greater omental mass, abdominal wall reconstruction with [name], appendectomy, and placement of Prevena vac  02/15/22: EXPLORATORY LAPAROTOMY REPAIR OF BOWEL PERFORATION, INSERTION OF MESH, APPLICATION OF WOUND VAC 02/26/22:  COLON RESECTION SIGMOID, HERNIA REPAIR VENTRAL, APPENDECTOMY, INSERTION OF MESH, REPAIR ABDOMINAL WALL 07/15/22: Neuro surgery consultation due to L1 compression fracture. Not a candidate for lumbosacral orthotic. 12/19/22: Insertion of Port-a-cath 04/06/23: removal of port-a-cath Throughout stay patient has required TPN, WOC & ID consultation. 04/28/23: 12 hours and surgery for abdominal wall reconstruction, recurrent incisional hernia repair, partial gastrectomy, small bowel resection, repair enterotomy x 3 and wound VAC placement.  Patient extubated postop but too lethargic to  maintain airway requiring reintubation and transferred to ICU for postoperative mechanical ventilatory management. 04/29/23: Pt remains mechanically intubated FiO2 40%/PEEP 5.  Pt awake and with purposeful movement on precedex gtt.  Will perform SBT with plans for possible extubation   Recent hospital events  04/30/23: hospitalist consulted for hyperglycemia and coagulopathy. Overnight fever to 101.8, tachycardic, WBC WNL but concern for SIRS/sepsis 08/23: pro calcitonin (+), starting tx for HCAP. MRSA screen recently neg 08/20, will get sputum cx. Echo EF 60-65%, G1DD      ASSESSMENT & PLAN:   Anasarca Hypoalbuminemia seems more likely than CHF but she does have G1 diastolic df  Net fluid balance >7500 ml since 08/19, patient was off levophed and BP seems stable. Appears to be overloaded,  ordered IV lasix but having to back off given AKI Echo shows G1 diastolic dysfunction  Consider adding albumin  See CXR images below   HCAP Meeting sepsis criteria overnight 08/22-08/23 w/ tachycardia and fever (+)procalcitonin  Ceftriaxone + azithromycin  Sputum culture  DuoNeb prn  Encourage incentive spirometry  CXR as needed, repeat in few days to monitor progress   Grade 1 diastolic dysfunction, preserved LVEF Suspect anasarca more from hypoalbuminemia as opposed to active CHF exacerbation Diuresis as needed but limited d/t AKI Albumin dose ordered  Hypoxic respiratory failure  Likely d/t HCAP Also suspect restrictive component  Supplemental O2 DuoNeb prn Treat underlying causes w/ HCAP abx   AKI Await echo, concern possible cardiorenal?  Caution w/ diuresis but may be only option if cardiac strain  Reducing lasix from 40 IV bid to 20   Hyperglycemia Most recent A1c 6.2 indicating a condition of prediabetes at baseline Acute phase of hyperglycemia likely secondary to stress and may be related to TPN. Review 24-hour insulin administration showed 7 unit of sliding scale in total  however most  occasions glucose still above 200. 08/22 - start Lantus 8 units daily Continue sliding scale every 4 hours   Coagulopathy Mild, chronic, review patient's record showed she has a chronic elevation of INR, at baseline 1.2-1.4 and per patient granddaughter, patient has no known history of easy bleeding, no SLE history in the family.  CT abdomen showed no signs of liver cirrhosis or NASH Given the patient prolonged history of intra-abdominal issues especially involving small bowel, there is concern about vitamin K depletion due to malabsorption. 1 dose of vitamin K subcu given, recommend checking INR periodically and before surgery    Chronic iron deficiency anemia Most recent 2 readings of iron study showed severe iron depletion, again raised the concern about malabsorption. Will check iron study                 Subjective / Brief ROS:  Patient denies pain, no trouble breathing, no chest pain, no headache   Family Communication: husband at bedside on rounds     Objective Findings:  Vitals:   05/01/23 0800 05/01/23 0900 05/01/23 1000 05/01/23 1200  BP:  (!) 121/53 (!) 110/52   Pulse:  88 89   Resp:  18 15   Temp: 98.4 F (36.9 C)   97.9 F (36.6 C)  TempSrc: Oral   Oral  SpO2:  99% 98%   Weight:      Height:        Intake/Output Summary (Last 24 hours) at 05/01/2023 1239 Last data filed at 05/01/2023 1100 Gross per 24 hour  Intake 2292.98 ml  Output 3780 ml  Net -1487.02 ml   Filed Weights   04/29/23 0326 04/30/23 0425 05/01/23 0500  Weight: 112.3 kg 121.7 kg 121.7 kg    Examination:  Physical Exam Constitutional:      General: She is not in acute distress.    Appearance: She is obese.  Cardiovascular:     Rate and Rhythm: Normal rate and regular rhythm.  Pulmonary:     Effort: Pulmonary effort is normal. No respiratory distress.     Comments: Exam limited, pt unable to move much to facilitate auscultation Musculoskeletal:     Right lower  leg: Edema present.     Left lower leg: Edema present.  Skin:    General: Skin is warm and dry.  Neurological:     General: No focal deficit present.     Mental Status: She is alert.  Psychiatric:        Mood and Affect: Mood normal.        Behavior: Behavior normal.          Scheduled Medications:   sodium chloride   Intravenous Once   alteplase  2 mg Intracatheter Once   azithromycin  500 mg Oral Daily   Chlorhexidine Gluconate Cloth  6 each Topical Daily   furosemide  20 mg Intravenous BID   insulin aspart  0-20 Units Subcutaneous Q4H   insulin glargine-yfgn  10 Units Subcutaneous Daily   lidocaine  2 patch Transdermal Q24H   pantoprazole (PROTONIX) IV  40 mg Intravenous Q24H   pregabalin  200 mg Oral TID   sodium chloride flush  10-40 mL Intracatheter Q12H    Continuous Infusions:  acetaminophen Stopped (04/30/23 2222)   albumin human     cefTRIAXone (ROCEPHIN)  IV 2 g (05/01/23 1227)   methocarbamol (ROBAXIN) IV 500 mg (05/01/23 0605)   norepinephrine (LEVOPHED) Adult infusion Stopped (04/30/23 1901)   TPN ADULT (ION) 80  mL/hr at 05/01/23 1058   TPN ADULT (ION)      PRN Medications:  acetaminophen, dextrose, diphenhydrAMINE **OR** diphenhydrAMINE, fluticasone, HYDROmorphone (DILAUDID) injection, iohexol, ipratropium-albuterol, ketorolac, methocarbamol **OR** methocarbamol (ROBAXIN) IV, metoprolol tartrate, ondansetron **OR** ondansetron (ZOFRAN) IV, mouth rinse, phenol, prochlorperazine, silver nitrate applicators, sodium chloride flush  Antimicrobials from admission:  Anti-infectives (From admission, onward)    Start     Dose/Rate Route Frequency Ordered Stop   05/01/23 1230  azithromycin (ZITHROMAX) tablet 500 mg        500 mg Oral Daily 05/01/23 1135 05/06/23 0959   05/01/23 1200  cefTRIAXone (ROCEPHIN) 2 g in sodium chloride 0.9 % 100 mL IVPB        2 g 200 mL/hr over 30 Minutes Intravenous Every 24 hours 05/01/23 1135 05/06/23 1159   04/28/23 2200   cefoTEtan (CEFOTAN) 2 g in sodium chloride 0.9 % 100 mL IVPB        2 g 200 mL/hr over 30 Minutes Intravenous Every 8 hours 04/28/23 2058 04/29/23 1717   04/27/23 0945  cefoTEtan (CEFOTAN) 2 g in sodium chloride 0.9 % 100 mL IVPB        2 g 200 mL/hr over 30 Minutes Intravenous On call to O.R. 04/27/23 0856 04/28/23 0559   04/05/23 0900  cefTRIAXone (ROCEPHIN) 2 g in sodium chloride 0.9 % 100 mL IVPB        2 g 200 mL/hr over 30 Minutes Intravenous Every 24 hours 04/05/23 0634 04/14/23 1254   12/19/22 1000  ceFAZolin (ANCEF) IVPB 2g/100 mL premix        2 g 200 mL/hr over 30 Minutes Intravenous  Once 12/18/22 1738 12/19/22 1141   09/17/22 1200  vancomycin (VANCOREADY) IVPB 1250 mg/250 mL  Status:  Discontinued        1,250 mg 166.7 mL/hr over 90 Minutes Intravenous Every 24 hours 09/16/22 1040 09/22/22 1139   09/16/22 1200  vancomycin (VANCOREADY) IVPB 1250 mg/250 mL        1,250 mg 166.7 mL/hr over 90 Minutes Intravenous  Once 09/15/22 1706 09/16/22 1255   09/15/22 1200  ceFEPIme (MAXIPIME) 2 g in sodium chloride 0.9 % 100 mL IVPB  Status:  Discontinued        2 g 200 mL/hr over 30 Minutes Intravenous Every 8 hours 09/15/22 1024 09/18/22 1026   09/15/22 1200  vancomycin (VANCOREADY) IVPB 2000 mg/400 mL        2,000 mg 200 mL/hr over 120 Minutes Intravenous  Once 09/15/22 1024 09/15/22 1500   03/04/22 2200  piperacillin-tazobactam (ZOSYN) IVPB 3.375 g        3.375 g 12.5 mL/hr over 240 Minutes Intravenous Every 8 hours 03/04/22 2005 03/18/22 0248   02/21/22 1500  vancomycin (VANCOREADY) IVPB 1250 mg/250 mL  Status:  Discontinued        1,250 mg 166.7 mL/hr over 90 Minutes Intravenous Every 24 hours 02/21/22 1111 02/24/22 1336   02/21/22 1400  meropenem (MERREM) 1 g in sodium chloride 0.9 % 100 mL IVPB        1 g 200 mL/hr over 30 Minutes Intravenous Every 8 hours 02/21/22 1258 02/27/22 2157   02/20/22 1500  vancomycin (VANCOCIN) IVPB 1000 mg/200 mL premix  Status:  Discontinued         1,000 mg 200 mL/hr over 60 Minutes Intravenous Every 24 hours 02/19/22 1706 02/21/22 1111   02/19/22 1115  vancomycin (VANCOREADY) IVPB 2000 mg/400 mL        2,000 mg 200  mL/hr over 120 Minutes Intravenous  Once 02/19/22 1025 02/19/22 1732   02/19/22 1115  fluconazole (DIFLUCAN) IVPB 400 mg        400 mg 100 mL/hr over 120 Minutes Intravenous Every 24 hours 02/19/22 1025 03/04/22 1301   02/15/22 0730  piperacillin-tazobactam (ZOSYN) IVPB 3.375 g  Status:  Discontinued        3.375 g 12.5 mL/hr over 240 Minutes Intravenous Every 8 hours 02/15/22 0639 02/21/22 1235   02/15/22 0655  piperacillin-tazobactam (ZOSYN) 3.375 GM/50ML IVPB       Note to Pharmacy: Loretha Stapler N: cabinet override      02/15/22 0655 02/15/22 0816   02/13/22 2200  cefoTEtan (CEFOTAN) 2 g in sodium chloride 0.9 % 100 mL IVPB  Status:  Discontinued        2 g 200 mL/hr over 30 Minutes Intravenous Every 12 hours 02/13/22 1541 02/15/22 0639   02/13/22 1345  cefoTEtan (CEFOTAN) 2 g in sodium chloride 0.9 % 100 mL IVPB        2 g 200 mL/hr over 30 Minutes Intravenous  Once 02/13/22 1331 02/13/22 2146   02/13/22 1336  sodium chloride 0.9 % with cefoTEtan (CEFOTAN) ADS Med       Note to Pharmacy: Gates Rigg: cabinet override      02/13/22 1336 02/14/22 0144   02/13/22 0620  sodium chloride 0.9 % with cefoTEtan (CEFOTAN) ADS Med       Note to Pharmacy: Agnes Lawrence A: cabinet override      02/13/22 0620 02/13/22 0802   02/13/22 0600  cefoTEtan (CEFOTAN) 2 g in sodium chloride 0.9 % 100 mL IVPB        2 g 200 mL/hr over 30 Minutes Intravenous On call to O.R. 02/12/22 2229 02/13/22 2146           Data Reviewed:  I have personally reviewed the following...  CBC: Recent Labs  Lab 04/29/23 0346 04/29/23 1645 04/30/23 0401 04/30/23 0830 05/01/23 0352  WBC 9.0 2.6* 4.9 6.0 8.6  NEUTROABS  --  1.7  --   --   --   HGB 9.0* 6.3* 8.4* 8.4* 8.1*  HCT 25.6* 18.7* 24.9* 25.0* 25.3*  MCV 82.8 85.4 86.8  93.6 90.4  PLT 91* 37* 52* 62* 54*   Basic Metabolic Panel: Recent Labs  Lab 04/27/23 0605 04/28/23 1128 04/28/23 2116 04/29/23 0346 04/30/23 0401 05/01/23 0352  NA 138 136 138 137 142 144  K 3.7 4.2 5.0 4.7 4.2 4.7  CL 106 107 110 111 110 110  CO2 28 23 20* 22 26 26   GLUCOSE 185* 235* 320* 433* 275* 286*  BUN 22* 22* 27* 28* 35* 49*  CREATININE 0.54 0.54 0.83 0.78 0.82 1.39*  CALCIUM 8.0* 10.0 7.3* 7.3* 7.9* 7.8*  MG 2.1  --  1.7  --  2.6* 2.4  PHOS 2.9  --  5.8*  --  2.8  --    GFR: Estimated Creatinine Clearance: 51.3 mL/min (A) (by C-G formula based on SCr of 1.39 mg/dL (H)). Liver Function Tests: Recent Labs  Lab 04/27/23 0605 04/28/23 1128 04/28/23 2116 04/29/23 0346 05/01/23 0352  AST 45* 55* 75* 60* 52*  ALT 32 33 49* 45* 49*  ALKPHOS 170* 141* 90 74 117  BILITOT 0.6 1.4* 2.2* 1.1 2.2*  PROT 5.8* 5.1* 4.9* 4.4* 4.8*  ALBUMIN 2.3* 2.1* 2.7* 2.2* 2.2*   No results for input(s): "LIPASE", "AMYLASE" in the last 168 hours. No results for input(s): "AMMONIA" in  the last 168 hours. Coagulation Profile: Recent Labs  Lab 04/28/23 1128 04/28/23 2116 04/29/23 0346 04/30/23 0508 05/01/23 0352  INR 1.4* 1.5* 1.4* 1.6* 1.6*   Cardiac Enzymes: No results for input(s): "CKTOTAL", "CKMB", "CKMBINDEX", "TROPONINI" in the last 168 hours. BNP (last 3 results) No results for input(s): "PROBNP" in the last 8760 hours. HbA1C: No results for input(s): "HGBA1C" in the last 72 hours. CBG: Recent Labs  Lab 05/01/23 0016 05/01/23 0357 05/01/23 0400 05/01/23 0740 05/01/23 1132  GLUCAP 281* >600* 261* 248* 247*   Lipid Profile: No results for input(s): "CHOL", "HDL", "LDLCALC", "TRIG", "CHOLHDL", "LDLDIRECT" in the last 72 hours. Thyroid Function Tests: Recent Labs    04/30/23 0921  TSH 3.667   Anemia Panel: Recent Labs    04/30/23 0401 04/30/23 0830  FERRITIN 86  --   TIBC 172*  --   IRON 16*  --   RETICCTPCT  --  4.3*   Most Recent Urinalysis On File:      Component Value Date/Time   COLORURINE YELLOW (A) 04/05/2023 0300   APPEARANCEUR HAZY (A) 04/05/2023 0300   LABSPEC 1.016 04/05/2023 0300   PHURINE 6.0 04/05/2023 0300   GLUCOSEU NEGATIVE 04/05/2023 0300   HGBUR NEGATIVE 04/05/2023 0300   BILIRUBINUR NEGATIVE 04/05/2023 0300   KETONESUR NEGATIVE 04/05/2023 0300   PROTEINUR NEGATIVE 04/05/2023 0300   NITRITE NEGATIVE 04/05/2023 0300   LEUKOCYTESUR NEGATIVE 04/05/2023 0300   Sepsis Labs: @LABRCNTIP (procalcitonin:4,lacticidven:4) Microbiology: Recent Results (from the past 240 hour(s))  MRSA Next Gen by PCR, Nasal     Status: None   Collection Time: 04/28/23  8:33 PM   Specimen: Nasal Mucosa; Nasal Swab  Result Value Ref Range Status   MRSA by PCR Next Gen NOT DETECTED NOT DETECTED Final    Comment: (NOTE) The GeneXpert MRSA Assay (FDA approved for NASAL specimens only), is one component of a comprehensive MRSA colonization surveillance program. It is not intended to diagnose MRSA infection nor to guide or monitor treatment for MRSA infections. Test performance is not FDA approved in patients less than 26 years old. Performed at [facility], [address], [place], [place] [address]       Radiology Studies last 3 days: DG Chest 1 View  Result Date: 05/01/2023 CLINICAL DATA:  [address] with congestive heart failure. EXAM: CHEST  1 VIEW COMPARISON:  Portable chest yesterday at 9:16 p.m. FINDINGS: 6:15 a.m. NGT terminates in the distal gastric body. Right PICC tip is in the right atrium. Stable cardiomegaly but with increased central vascular engorgement. No significant interstitial edema. Small left pleural effusion continues to be seen with left lower lobe patchy consolidation. There is a low inspiration. Increased haziness in the perihilar areas could be due to low inspiration, pneumonia or edema. IMPRESSION: 1. Increased central vascular engorgement without significant interstitial edema. 2. Increased haziness in the  perihilar areas could be due to low inspiration, pneumonia or ground-glass edema. 3. Stable small left pleural effusion with left lower lobe consolidation. Electronically Signed   By: Almira Bar M.D.   On: 05/01/2023 07:26   DG Chest Port 1 View  Result Date: 04/30/2023 CLINICAL DATA:  Fever EXAM: PORTABLE CHEST 1 VIEW COMPARISON:  04/30/2023 FINDINGS: Nasogastric tube extends into the upper abdomen beyond the margin examination. Right upper extremity PICC line tip is seen within the deep right atrium, unchanged from prior examination. Lung volumes are small. Opacification of the left hemithorax has improved suggesting decrease of a posterior layering pleural effusion. Retrocardiac opacification persists  in keeping with atelectasis, infiltrate, or posteriorly layering pleural fluid in this location. Right lung is clear. No pneumothorax. No pleural effusion on the right. Stable cardiomegaly. IMPRESSION: 1. Improved aeration of the left hemithorax suggesting decrease of a posterior layering pleural effusion. Persistent retrocardiac opacification in keeping with atelectasis, infiltrate, or posteriorly layering pleural fluid in this location. 2. Pulmonary hypoinflation. 3. Stable cardiomegaly Electronically Signed   By: Helyn Numbers M.D.   On: 04/30/2023 23:55   DG Chest 1 View  Result Date: 04/30/2023 CLINICAL DATA:  Congestive heart failure. EXAM: CHEST  1 VIEW COMPARISON:  04/28/2023. FINDINGS: Interval removal of the endotracheal tube. Enteric tube courses below the diaphragm, beyond the field of view. Unchanged right upper extremity PICC with tip projecting over the right atrium. Increased opacity of the left hemithorax, which may represent increasing left pleural effusion and/or atelectasis or infection. No pneumothorax. IMPRESSION: Increased opacity of the left hemithorax, which may represent increasing left pleural effusion and/or atelectasis or infection. Electronically Signed   By: Orvan Falconer M.D.   On: 04/30/2023 15:46   ECHOCARDIOGRAM COMPLETE  Result Date: 04/30/2023    ECHOCARDIOGRAM REPORT   Patient Name:   CLAUDE VESCO Date of Exam: 04/30/2023 Medical Rec #:  [id number]            Height:       59.0 in Accession #:    [id number]           Weight:       268.3 lb Date of Birth:  1966/01/14            BSA:          2.090 m Patient Age:    57 years             BP:           141/79 mmHg Patient Gender: F                    HR:           79 bpm. Exam Location:  [facility] Procedure: 2D Echo, Cardiac Doppler, Color Doppler and Intracardiac            Opacification Agent Indications:     CHF  History:         Patient has no prior history of Echocardiogram examinations.                  CHF. Colon CA.  Sonographer:     Mikki Harbor Referring Phys:  [id number] Emeline General Diagnosing Phys: Julien Nordmann MD  Sonographer Comments: Technically difficult study due to poor echo windows and patient is obese. IMPRESSIONS  1. Left ventricular ejection fraction, by estimation, is 60 to 65%. The left ventricle has normal function. The left ventricle has no regional wall motion abnormalities. Left ventricular diastolic parameters are consistent with Grade I diastolic dysfunction (impaired relaxation).  2. Right ventricular systolic function is normal. The right ventricular size is normal.  3. The mitral valve is normal in structure. No evidence of mitral valve regurgitation. No evidence of mitral stenosis.  4. The aortic valve has an indeterminant number of cusps. Aortic valve regurgitation is not visualized. No aortic stenosis is present.  5. The inferior vena cava is normal in size with greater than 50% respiratory variability, suggesting right atrial pressure of 3 mmHg. FINDINGS  Left Ventricle: Left ventricular ejection fraction, by estimation, is 60 to 65%. The left ventricle has normal  function. The left ventricle has no regional wall motion abnormalities. Definity contrast agent was given IV to  delineate the left ventricular  endocardial borders. The left ventricular internal cavity size was normal in size. There is no left ventricular hypertrophy. Left ventricular diastolic parameters are consistent with Grade I diastolic dysfunction (impaired relaxation). Right Ventricle: The right ventricular size is normal. No increase in right ventricular wall thickness. Right ventricular systolic function is normal. Left Atrium: Left atrial size was normal in size. Right Atrium: Right atrial size was normal in size. Pericardium: There is no evidence of pericardial effusion. Mitral Valve: The mitral valve is normal in structure. No evidence of mitral valve regurgitation. No evidence of mitral valve stenosis. MV peak gradient, 5.2 mmHg. The mean mitral valve gradient is 3.0 mmHg. Tricuspid Valve: The tricuspid valve is normal in structure. Tricuspid valve regurgitation is not demonstrated. No evidence of tricuspid stenosis. Aortic Valve: The aortic valve has an indeterminant number of cusps. Aortic valve regurgitation is not visualized. No aortic stenosis is present. Aortic valve mean gradient measures 3.0 mmHg. Aortic valve peak gradient measures 6.9 mmHg. Aortic valve area, by VTI measures 2.05 cm. Pulmonic Valve: The pulmonic valve was normal in structure. Pulmonic valve regurgitation is not visualized. No evidence of pulmonic stenosis. Aorta: The aortic root is normal in size and structure. Venous: The inferior vena cava is normal in size with greater than 50% respiratory variability, suggesting right atrial pressure of 3 mmHg. IAS/Shunts: No atrial level shunt detected by color flow Doppler.  LEFT VENTRICLE PLAX 2D LVIDd:         4.30 cm   Diastology LVIDs:         2.60 cm   LV e' medial:    8.92 cm/s LV PW:         1.00 cm   LV E/e' medial:  11.4 LV IVS:        1.10 cm   LV e' lateral:   9.14 cm/s LVOT diam:     2.00 cm   LV E/e' lateral: 11.2 LV SV:         49 LV SV Index:   23 LVOT Area:     3.14 cm  LEFT  ATRIUM           Index LA diam:      3.20 cm 1.53 cm/m LA Vol (A4C): 35.0 ml 16.75 ml/m  AORTIC VALVE                    PULMONIC VALVE AV Area (Vmax):    2.30 cm     PV Vmax:       1.24 m/s AV Area (Vmean):   2.13 cm     PV Peak grad:  6.2 mmHg AV Area (VTI):     2.05 cm AV Vmax:           131.00 cm/s AV Vmean:          81.700 cm/s AV VTI:            0.237 m AV Peak Grad:      6.9 mmHg AV Mean Grad:      3.0 mmHg LVOT Vmax:         95.80 cm/s LVOT Vmean:        55.400 cm/s LVOT VTI:          0.155 m LVOT/AV VTI ratio: 0.65  AORTA Ao Root diam: 2.50 cm Ao Asc diam:  2.50 cm MITRAL  VALVE MV Area (PHT): 4.96 cm     SHUNTS MV Area VTI:   1.93 cm     Systemic VTI:  0.16 m MV Peak grad:  5.2 mmHg     Systemic Diam: 2.00 cm MV Mean grad:  3.0 mmHg MV Vmax:       1.14 m/s MV Vmean:      76.4 cm/s MV Decel Time: 153 msec MV E velocity: 102.00 cm/s MV A velocity: 108.00 cm/s MV E/A ratio:  0.94 Julien Nordmann MD Electronically signed by Julien Nordmann MD Signature Date/Time: 04/30/2023/1:54:41 PM    Final    DG Chest Port 1 View  Result Date: 04/28/2023 CLINICAL DATA:  [id number]. Encounter for intubation. [id number]. Encounter for orogastric tube placement. Post intubation film from the operating room. Status post exploratory laparotomy and small-bowel resection. EXAM: PORTABLE CHEST 1 VIEW PORTABLE ABDOMEN 1 VIEW COMPARISON:  Chest, abdomen and pelvis CT 04/01/2023, portable chest 04/04/2023. FINDINGS: Chest AP portable at 9:26 p.m.: ETT tip is 3.5 cm from the carina. Multiple overlying monitor wires. Interval removal of prior right chest port and IJ approach catheter. There is a right PICC or right subclavian small caliber line terminating about the superior cavoatrial junction. There is no pneumothorax. Small to moderate left pleural effusion is noted tracking to the apex, with opacity in the left lower lobe consistent with atelectasis or consolidation. Rest of the lungs hypoexpanded but generally clear. The  mediastinum is stable with lipomatosis exaggerating the superior mediastinum. There is mild-to-moderate cardiomegaly but no findings of acute CHF. Osteopenia and thoracic spondylosis. High transverse single AP portable upper abdomen view supine, 9:27 p.m.: NGT is in place with the tip in the mid to distal body of stomach. The visualized bowel pattern is nonobstructive but the lower abdominal and pelvic small bowel was excluded from the exam. There is no supine evidence of free air. No pathologic calcification or other significant findings. IMPRESSION: 1. Support devices appear well positioned. 2. Left lower lobe opacity consistent with atelectasis or consolidation with small to moderate left pleural effusion. 3. Cardiomegaly without evidence of acute CHF. 4. Visualized bowel pattern is nonobstructive but the lower abdominal and pelvic small bowel was excluded from the exam. 5. Mediastinal lipomatosis. Electronically Signed   By: Almira Bar M.D.   On: 04/28/2023 22:09   DG Abd 1 View  Result Date: 04/28/2023 CLINICAL DATA:  [id number]. Encounter for intubation. [id number]. Encounter for orogastric tube placement. Post intubation film from the operating room. Status post exploratory laparotomy and small-bowel resection. EXAM: PORTABLE CHEST 1 VIEW PORTABLE ABDOMEN 1 VIEW COMPARISON:  Chest, abdomen and pelvis CT 04/01/2023, portable chest 04/04/2023. FINDINGS: Chest AP portable at 9:26 p.m.: ETT tip is 3.5 cm from the carina. Multiple overlying monitor wires. Interval removal of prior right chest port and IJ approach catheter. There is a right PICC or right subclavian small caliber line terminating about the superior cavoatrial junction. There is no pneumothorax. Small to moderate left pleural effusion is noted tracking to the apex, with opacity in the left lower lobe consistent with atelectasis or consolidation. Rest of the lungs hypoexpanded but generally clear. The mediastinum is stable with lipomatosis  exaggerating the superior mediastinum. There is mild-to-moderate cardiomegaly but no findings of acute CHF. Osteopenia and thoracic spondylosis. High transverse single AP portable upper abdomen view supine, 9:27 p.m.: NGT is in place with the tip in the mid to distal body of stomach. The visualized bowel pattern is nonobstructive but the lower abdominal and pelvic  small bowel was excluded from the exam. There is no supine evidence of free air. No pathologic calcification or other significant findings. IMPRESSION: 1. Support devices appear well positioned. 2. Left lower lobe opacity consistent with atelectasis or consolidation with small to moderate left pleural effusion. 3. Cardiomegaly without evidence of acute CHF. 4. Visualized bowel pattern is nonobstructive but the lower abdominal and pelvic small bowel was excluded from the exam. 5. Mediastinal lipomatosis. Electronically Signed   By: Almira Bar M.D.   On: 04/28/2023 22:09             LOS: 442 days    Time spent: 50 min    Sunnie Nielsen, DO [facility] 05/01/2023, 12:39 PM    Dictation software may have been used to generate the above note. Typos may occur and escape review in typed/dictated notes. Please contact Dr Lyn Hollingshead directly for clarity if needed.  Staff may message me via secure chat in Epic  but this may not receive an immediate response,  please page me for urgent matters!  If 7PM-7AM, please contact night coverage [email or web address]

## 2023-05-01 NOTE — Hospital Course (Addendum)
Debra [name] is a 57 y.o. 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 with recurrent disease at anterior pelvis next to the anastomosis which was complicated by high output enterocutaneous fistula , and laparotomy excision of greater omental mass, abdominal wall reconstruction with [name], appendectomy and placement of Prevena vacuum on 06/08, complicated with small bowel perforation, underwent reopening of laparotomy and repair of small bowel perforation, high output enterocutaneous fistula, on chronic TPN since April this year, has had a prolonged hospital stay, complicated with right IJ Port-A-Cath Enterococcus infection which was removed and a new PICC line was placed recently.  Hospitalist team was asked to manage patient's hyperglycemia and coagulopathy.    Significant hospital events:  02/13/22: initial laparotomy, excision of greater omental mass, abdominal wall reconstruction with [name], appendectomy, and placement of Prevena vac  02/15/22: EXPLORATORY LAPAROTOMY REPAIR OF BOWEL PERFORATION, INSERTION OF MESH, APPLICATION OF WOUND VAC 02/26/22:  COLON RESECTION SIGMOID, HERNIA REPAIR VENTRAL, APPENDECTOMY, INSERTION OF MESH, REPAIR ABDOMINAL WALL 07/15/22: Neuro surgery consultation due to L1 compression fracture. Not a candidate for lumbosacral orthotic. 12/19/22: Insertion of Port-a-cath 04/06/23: removal of port-a-cath Throughout stay patient has required TPN, WOC & ID consultation. 04/28/23: 12 hours and surgery for abdominal wall reconstruction, recurrent incisional hernia repair, partial gastrectomy, small bowel resection, repair enterotomy x 3 and wound VAC placement.  Patient extubated postop but too lethargic to maintain airway requiring reintubation and transferred to ICU for postoperative mechanical ventilatory management. 04/29/23: Pt remains mechanically intubated FiO2 40%/PEEP 5.  Pt awake and  with purposeful movement on precedex gtt.  Will perform SBT with plans for possible extubation   Recent hospital events  04/30/23: hospitalist consulted for hyperglycemia and coagulopathy. Overnight fever to 101.8, tachycardic, WBC WNL but concern for SIRS/sepsis 08/23: pro calcitonin (+), starting tx for HCAP. MRSA screen recently neg 08/20, will get sputum cx. Echo EF 60-65%, G1DD      ASSESSMENT & PLAN:   Anasarca Hypoalbuminemia seems more likely than CHF but she does have G1 diastolic df  Net fluid balance >7500 ml since 08/19, patient was off levophed and BP seems stable. Appears to be overloaded,  ordered IV lasix but having to back off given AKI Echo shows G1 diastolic dysfunction  Consider adding albumin  See CXR images below   HCAP Meeting sepsis criteria overnight 08/22-08/23 w/ tachycardia and fever (+)procalcitonin  Ceftriaxone + azithromycin  Sputum culture  DuoNeb prn  Encourage incentive spirometry  CXR as needed, repeat in few days to monitor progress   Grade 1 diastolic dysfunction, preserved LVEF Suspect anasarca more from hypoalbuminemia as opposed to active CHF exacerbation Diuresis as needed but limited d/t AKI Albumin dose ordered  Hypoxic respiratory failure  Likely d/t HCAP Also suspect restrictive component  Supplemental O2 DuoNeb prn Treat underlying causes w/ HCAP abx   AKI Await echo, concern possible cardiorenal?  Caution w/ diuresis but may be only option if cardiac strain  Reducing lasix from 40 IV bid to 20   Hyperglycemia Most recent A1c 6.2 indicating a condition of prediabetes at baseline Acute phase of hyperglycemia likely secondary to stress and may be related to TPN. Review 24-hour insulin administration showed 7 unit of sliding scale in total however most occasions glucose still above 200. 08/22 - start Lantus 8 units daily Continue sliding scale every 4 hours   Coagulopathy Mild, chronic, review patient's record showed she  has a chronic elevation of INR,  at baseline 1.2-1.4 and per patient granddaughter, patient has no known history of easy bleeding, no SLE history in the family.  CT abdomen showed no signs of liver cirrhosis or NASH Given the patient prolonged history of intra-abdominal issues especially involving small bowel, there is concern about vitamin K depletion due to malabsorption. 1 dose of vitamin K subcu given, recommend checking INR periodically and before surgery    Chronic iron deficiency anemia Most recent 2 readings of iron study showed severe iron depletion, again raised the concern about malabsorption. Will check iron study

## 2023-05-02 ENCOUNTER — Inpatient Hospital Stay: Payer: Medicaid Other

## 2023-05-02 LAB — GLUCOSE, CAPILLARY
Glucose-Capillary: 252 mg/dL — ABNORMAL HIGH (ref 70–99)
Glucose-Capillary: 274 mg/dL — ABNORMAL HIGH (ref 70–99)
Glucose-Capillary: 258 mg/dL — ABNORMAL HIGH (ref 70–99)
Glucose-Capillary: 198 mg/dL — ABNORMAL HIGH (ref 70–99)

## 2023-05-02 LAB — CBC WITH DIFFERENTIAL/PLATELET
Abs Immature Granulocytes: 0.43 10*3/uL — ABNORMAL HIGH (ref 0.00–0.07)
Basophils Absolute: 0.1 10*3/uL (ref 0.0–0.1)
Basophils Relative: 1 %
Eosinophils Absolute: 0 10*3/uL (ref 0.0–0.5)
Eosinophils Relative: 0 %
HCT: 24.3 % — ABNORMAL LOW (ref 36.0–46.0)
Hemoglobin: 7.8 g/dL — ABNORMAL LOW (ref 12.0–15.0)
Immature Granulocytes: 6 %
Lymphocytes Relative: 8 %
Lymphs Abs: 0.6 10*3/uL — ABNORMAL LOW (ref 0.7–4.0)
MCH: 28.4 pg (ref 26.0–34.0)
MCHC: 32.1 g/dL (ref 30.0–36.0)
MCV: 88.4 fL (ref 80.0–100.0)
Monocytes Absolute: 1 10*3/uL (ref 0.1–1.0)
Monocytes Relative: 13 %
Neutro Abs: 5.6 10*3/uL (ref 1.7–7.7)
Neutrophils Relative %: 72 %
Platelets: 42 10*3/uL — ABNORMAL LOW (ref 150–400)
RBC: 2.75 MIL/uL — ABNORMAL LOW (ref 3.87–5.11)
RDW: 21.1 % — ABNORMAL HIGH (ref 11.5–15.5)
Smear Review: DECREASED
WBC: 7.7 10*3/uL (ref 4.0–10.5)
nRBC: 2.1 % — ABNORMAL HIGH (ref 0.0–0.2)

## 2023-05-02 LAB — COMPREHENSIVE METABOLIC PANEL
ALT: 50 U/L — ABNORMAL HIGH (ref 0–44)
AST: 53 U/L — ABNORMAL HIGH (ref 15–41)
Albumin: 2.1 g/dL — ABNORMAL LOW (ref 3.5–5.0)
Alkaline Phosphatase: 167 U/L — ABNORMAL HIGH (ref 38–126)
Anion gap: 8 (ref 5–15)
BUN: 39 mg/dL — ABNORMAL HIGH (ref 6–20)
CO2: 29 mmol/L (ref 22–32)
Calcium: 8.2 mg/dL — ABNORMAL LOW (ref 8.9–10.3)
Chloride: 115 mmol/L — ABNORMAL HIGH (ref 98–111)
Creatinine, Ser: 0.9 mg/dL (ref 0.44–1.00)
GFR, Estimated: >60 mL/min (ref >60)
Glucose, Bld: 258 mg/dL — ABNORMAL HIGH (ref 70–99)
Potassium: 2.9 mmol/L — ABNORMAL LOW (ref 3.5–5.1)
Sodium: 151 mmol/L — ABNORMAL HIGH (ref 135–145)
Total Bilirubin: 2.4 mg/dL — ABNORMAL HIGH (ref 0.3–1.2)
Total Protein: 4.8 g/dL — ABNORMAL LOW (ref 6.5–8.1)

## 2023-05-02 LAB — FERRITIN: Ferritin: 878 ng/mL — ABNORMAL HIGH (ref 11–307)

## 2023-05-02 LAB — POTASSIUM: Potassium: 3.1 mmol/L — ABNORMAL LOW (ref 3.5–5.1)

## 2023-05-02 LAB — OSMOLALITY, URINE: Osmolality, Ur: 444 mOsm/kg (ref 300–900)

## 2023-05-02 LAB — IRON AND TIBC
Iron: 37 ug/dL (ref 28–170)
Saturation Ratios: 26 % (ref 10.4–31.8)
TIBC: 140 ug/dL — ABNORMAL LOW (ref 250–450)
UIBC: 103 ug/dL

## 2023-05-02 LAB — SODIUM: Sodium: 152 mmol/L — ABNORMAL HIGH (ref 135–145)

## 2023-05-02 MED ORDER — ALBUMIN HUMAN 25 % IV SOLN
25.0000 g | Freq: Once | INTRAVENOUS | Status: AC
  Administered 2023-05-02: 25 g via INTRAVENOUS
  Filled 2023-05-02: qty 100

## 2023-05-02 MED ORDER — METRONIDAZOLE 500 MG/100ML IV SOLN
500.0000 mg | Freq: Two times a day (BID) | INTRAVENOUS | Status: DC
  Administered 2023-05-02 – 2023-05-03 (×3): 500 mg via INTRAVENOUS
  Filled 2023-05-02 (×4): qty 100

## 2023-05-02 MED ORDER — VANCOMYCIN HCL 2000 MG/400ML IV SOLN
2000.0000 mg | Freq: Once | INTRAVENOUS | Status: AC
  Administered 2023-05-02: 2000 mg via INTRAVENOUS
  Filled 2023-05-02: qty 400

## 2023-05-02 MED ORDER — TRACE MINERALS CU-MN-SE-ZN 300-55-60-3000 MCG/ML IV SOLN
INTRAVENOUS | Status: AC
  Filled 2023-05-02: qty 729.6

## 2023-05-02 MED ORDER — VANCOMYCIN HCL IN DEXTROSE 1-5 GM/200ML-% IV SOLN: 1000.0000 mg | INTRAVENOUS | Status: DC

## 2023-05-02 MED ORDER — INSULIN ASPART 100 UNIT/ML IJ SOLN
2.0000 [IU] | INTRAMUSCULAR | Status: DC
  Administered 2023-05-02 – 2023-05-03 (×5): 2 [IU] via SUBCUTANEOUS
  Filled 2023-05-02 (×5): qty 1

## 2023-05-02 MED ORDER — INSULIN GLARGINE-YFGN 100 UNIT/ML ~~LOC~~ SOLN
17.0000 [IU] | Freq: Every day | SUBCUTANEOUS | Status: DC
  Filled 2023-05-02: qty 0.17

## 2023-05-02 MED ORDER — SODIUM CHLORIDE 0.9 % IV SOLN
2.0000 g | Freq: Three times a day (TID) | INTRAVENOUS | Status: DC
  Administered 2023-05-02 – 2023-05-03 (×4): 2 g via INTRAVENOUS
  Filled 2023-05-02 (×5): qty 12.5

## 2023-05-02 MED ORDER — LACTATED RINGERS IV SOLN: INTRAVENOUS | Status: DC

## 2023-05-02 MED ORDER — INSULIN GLARGINE-YFGN 100 UNIT/ML ~~LOC~~ SOLN
15.0000 [IU] | Freq: Every day | SUBCUTANEOUS | Status: DC
  Administered 2023-05-02: 15 [IU] via SUBCUTANEOUS
  Filled 2023-05-02: qty 0.15

## 2023-05-02 MED ORDER — POTASSIUM CHLORIDE 10 MEQ/100ML IV SOLN
10.0000 meq | INTRAVENOUS | Status: AC
  Administered 2023-05-02 (×4): 10 meq via INTRAVENOUS
  Filled 2023-05-02 (×2): qty 100

## 2023-05-02 MED ORDER — ACETAMINOPHEN 10 MG/ML IV SOLN
1000.0000 mg | Freq: Four times a day (QID) | INTRAVENOUS | Status: AC | PRN
  Administered 2023-05-02: 1000 mg via INTRAVENOUS
  Filled 2023-05-02: qty 100

## 2023-05-02 MED ORDER — POTASSIUM CHLORIDE 10 MEQ/50ML IV SOLN
10.0000 meq | INTRAVENOUS | Status: AC
  Administered 2023-05-02 (×4): 10 meq via INTRAVENOUS
  Filled 2023-05-02 (×4): qty 50

## 2023-05-02 NOTE — Consult Note (Signed)
Pharmacy Antibiotic Note  Debra [name] is a 57 y.o. female admitted on 02/13/2022 with  pneumonia and possible intra-abdominal infection .  Pharmacy has been consulted for cefepime and vancomycin dosing.  Plan: 1. Vancomycin 2000mg  IV LD, then 1000mg  IV q 24hrs Calculated AUC 501 Calculated Cmin 12.5 Scr used 0.90 Vd coefficient 0.50 (BMI > 30)  2. Cefepime 2gm q 8hrs  *Patient also on metronidazole 500mg  IV q 12hrs per MD orders*  Height: 4\' 11"  (149.9 cm) Weight: 93.9 kg (207 lb 0.2 oz) IBW/kg (Calculated) : 43.2  Temp (24hrs), Avg:99.5 F (37.5 C), Min:98.1 F (36.7 C), Max:101.1 F (38.4 C)  Recent Labs  Lab 04/27/23 2345 04/28/23 1128 04/28/23 2116 04/29/23 0346 04/29/23 1120 04/29/23 1645 04/30/23 0401 04/30/23 0830 05/01/23 0352 05/02/23 0442  WBC  --    < > 12.8* 9.0  --  2.6* 4.9 6.0 8.6 7.7  CREATININE  --    < > 0.83 0.78  --   --  0.82  --  1.39* 0.90  LATICACIDVEN 3.5*  --  3.6* 3.4* 4.4* 1.3  --   --   --   --    < > = values in this interval not displayed.    Estimated Creatinine Clearance: 67.5 mL/min (by C-G formula based on SCr of 0.9 mg/dL).    No Known Allergies  Antimicrobials this admission: 8/23 Azithromycin >>  8/23 Ceftriaxone >>  8/24 Cefepime >> 8/24 Vancomycin >>  Dose adjustments this admission: N/A  Microbiology results:  Thank you for allowing pharmacy to be a part of this patient's care.  Kodee Drury Rodriguez-Guzman PharmD, BCPS 05/02/2023 3:18 PM

## 2023-05-02 NOTE — Progress Notes (Signed)
Subjective:  CC: Debra [name] is a 57 y.o. female  Hospital stay day 443, 4 Days Post-Op s/p exploratory laparotomy, extensive lysis of adhesion, 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   HPI: No acute issues reported overnight.  No complaints of pain this pm.  ROS:  General: Denies weight loss, weight gain, fatigue, fevers, chills, and night sweats. Heart: Denies chest pain, palpitations, racing heart, irregular heartbeat, leg pain or swelling, and decreased activity tolerance. Respiratory: Denies breathing difficulty, shortness of breath, wheezing, cough, and sputum. GI: Denies change in appetite, heartburn, nausea, vomiting, constipation, diarrhea, and blood in stool. GU: Denies difficulty urinating, pain with urinating, urgency, frequency, blood in urine.   Objective:   Temp:  [98.1 F (36.7 C)-101.1 F (38.4 C)] 99 F (37.2 C) (08/24 1200) Pulse Rate:  [95-110] 107 (08/24 1200) Resp:  [19-31] 29 (08/24 1200) BP: (108-148)/(51-77) 124/59 (08/24 1200) SpO2:  [95 %-100 %] 96 % (08/24 1200) Weight:  [93.9 kg] 93.9 kg (08/24 0433)     Height: 4\' 11"  (149.9 cm) Weight: 93.9 kg BMI (Calculated): 41.79   Intake/Output this shift:   Intake/Output Summary (Last 24 hours) at 05/02/2023 1413 Last data filed at 05/02/2023 1233 Gross per 24 hour  Intake 2525.71 ml  Output 4390 ml  Net -1864.29 ml    Constitutional :  alert, cooperative, appears stated age, and no distress  Respiratory:  clear to auscultation bilaterally  Cardiovascular:  Sinus tachycardia  Gastrointestinal: Soft, no guarding, midline wound vac in place.  Some serosanguinous drainage in tubing, slightly thicker in canister.  JP right with some serous mixed with fatty? Tissue, left with dark serosanguinous drainage . NG with dark bilious output  Skin: Cool and moist. Increased edema all four  extremities  Psychiatric: Normal affect, non-agitated, not confused       LABS:     Latest Ref Rng & Units 05/02/2023    4:42 AM 05/01/2023    3:52 AM 04/30/2023    4:01 AM  CMP  Glucose 70 - 99 mg/dL 829  937  169   BUN 6 - 20 mg/dL 39  49  35   Creatinine 0.44 - 1.00 mg/dL 6.78  9.38  1.01   Sodium 135 - 145 mmol/L 151  144  142   Potassium 3.5 - 5.1 mmol/L 2.9  4.7  4.2   Chloride 98 - 111 mmol/L 115  110  110   CO2 22 - 32 mmol/L 29  26  26    Calcium 8.9 - 10.3 mg/dL 8.2  7.8  7.9   Total Protein 6.5 - 8.1 g/dL 4.8  4.8    Total Bilirubin 0.3 - 1.2 mg/dL 2.4  2.2    Alkaline Phos 38 - 126 U/L 167  117    AST 15 - 41 U/L 53  52    ALT 0 - 44 U/L 50  49        Latest Ref Rng & Units 05/02/2023    4:42 AM 05/01/2023    3:52 AM 04/30/2023    8:30 AM  CBC  WBC 4.0 - 10.5 K/uL 7.7  8.6  6.0   Hemoglobin 12.0 - 15.0 g/dL 7.8  8.1  8.4   Hematocrit 36.0 - 46.0 % 24.3  25.3  25.0   Platelets 150 - 400 K/uL 42  54  62     RADS: N/a Assessment:   S/p  exploratory laparotomy, extensive lysis of adhesion, 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   No acute changes. Continue supportive care per hospitalist team.  Appreciate recs.   labs/images/medications/previous chart entries reviewed personally and relevant changes/updates noted above.

## 2023-05-02 NOTE — Progress Notes (Signed)
   05/02/23 0900  Spiritual Encounters  Type of Visit Follow up  Care provided to: Pt and family  Reason for visit Routine spiritual support  OnCall Visit Yes   Chaplain provided brief post surgical care to patient and family member present.

## 2023-05-02 NOTE — Progress Notes (Signed)
PHARMACY - TOTAL PARENTERAL NUTRITION CONSULT NOTE   Indication: Prolonged ileus  Patient Measurements: Height: 4\' 11"  (149.9 cm) Weight: 93.9 kg (207 lb 0.2 oz) IBW/kg (Calculated) : 43.2 TPN AdjBW (KG): 59 Body mass index is 41.81 kg/m.  Assessment: Debra [name] is a 57 y.o. female s/p laparotomy, excision of greater omental mass, abdominal wall reconstruction with [name], appendectomy, and placement of Prevena vac.  Glucose / Insulin: Transitioned to SSI q4h + semglee 10 units + 10 units insulin in TPN on 05/01/23 SSI Insulin usage last 24h: 50 units Electrolytes: K 2.9  Na 151 Renal: SCr 0.9 Hepatic: AST / ALT slightly elevated  but stable TG: 8/19: 166, stable GI Imaging: 9/11 CTAP: no new acute issues GI Surgeries / Procedures:  --s/p laparotomy, excision of greater omental mass, abdominal wall reconstruction with [name], appendectomy, and placement of Prevena vac 12/19/22 placement of right IJ port-a-catheter with dual reservoirs also s/p re-opening of laparotomy for repair of small bowel perforation  --08/20  exploratory laparotomy, extensive lysis of adhesion, 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, partial gastrectomy, abdominal wall reconstruction with mesh placement, and wound vac placement   Central access: PICC 04/09/2023   TPN start date: 04/09/23    (was on TPN from 02/15/2022 to 04/05/2023- held for bacteremia and TPN restarted 04/09/23) -was on Cyclic TPN, transitioned back to regular TPN on 04/29/23 post surgery  RD Assessment:  Estimated Needs Total Energy Estimated Needs: 1900-2200kcal/day Total Protein Estimated Needs: 95-110g/day Total Fluid Estimated Needs: 1.4-1.6L/day  Current Nutrition: NPO  Plan:  ---Continue continuous TPN at goal rate of 22mL/hr at 1800 Electrolytes in TPN (standard): Na 49mEq/L, K 74mEq/L, Ca 106mEq/L, Mg 57mEq/L, and Phos 67mmol/L. Cl:Ac 1:1 -K 2.9   Will order KCL 10 meq IV x 4  runs, recheck at 1600 ---Re-adding multivitamin, trace elements and chromium chloride to TPN ---Due to hyperglycemic readings and insulin needs, increase insulin from 10 to 13 units added to TPN 05/02/23 ---Continue SSI 0-9 q4h + increase semglee form 10 units to 15 units(increased on 8/24) ---Monitor TPN labs daily until stable then on Mon/Thurs   Maniyah Moller A, PharmD Clinical Pharmacist 05/02/2023 7:28 AM

## 2023-05-02 NOTE — Progress Notes (Addendum)
PROGRESS NOTE    Debra [name]   ZOX:[id number] DOB: August 17, 1966  DOA: 02/13/2022 Date of Service: 05/02/23 PCP: Hillery Aldo, MD     Brief Narrative / Hospital Course:  Debra [name] is a 57 y.o. 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 with recurrent disease at anterior pelvis next to the anastomosis which was complicated by high output enterocutaneous fistula , and laparotomy excision of greater omental mass, abdominal wall reconstruction with [name], appendectomy and placement of Prevena vacuum on 06/08, complicated with small bowel perforation, underwent reopening of laparotomy and repair of small bowel perforation, high output enterocutaneous fistula, on chronic TPN since April this year, has had a prolonged hospital stay, complicated with right IJ Port-A-Cath Enterococcus infection which was removed and a new PICC line was placed recently.  Hospitalist team was asked to manage patient's hyperglycemia and coagulopathy.    Significant hospital events:  02/13/22: initial laparotomy, excision of greater omental mass, abdominal wall reconstruction with [name], appendectomy, and placement of Prevena vac  02/15/22: EXPLORATORY LAPAROTOMY REPAIR OF BOWEL PERFORATION, INSERTION OF MESH, APPLICATION OF WOUND VAC 02/26/22:  COLON RESECTION SIGMOID, HERNIA REPAIR VENTRAL, APPENDECTOMY, INSERTION OF MESH, REPAIR ABDOMINAL WALL 07/15/22: Neuro surgery consultation due to L1 compression fracture. Not a candidate for lumbosacral orthotic. 12/19/22: Insertion of Port-a-cath 04/06/23: removal of port-a-cath Throughout stay patient has required TPN, WOC & ID consultation.   Recent hospital events  04/28/23: 12 hours and surgery for abdominal wall reconstruction, recurrent incisional hernia repair, partial gastrectomy, small bowel resection, repair enterotomy x 3 and wound VAC placement.  Patient extubated  postop but too lethargic to maintain airway requiring reintubation and transferred to ICU for postoperative mechanical ventilatory management. 04/29/23: Pt remains mechanically intubated FiO2 40%/PEEP 5.  Pt awake and with purposeful movement on precedex gtt.  Will perform SBT with plans for possible extubation  04/30/23: hospitalist consulted for hyperglycemia and coagulopathy. Overnight fever to 101.8, tachycardic, WBC WNL but concern for SIRS/sepsis 08/23: pro calcitonin (+), starting tx for HCAP. MRSA resp screen recently neg 08/20, will get sputum cx. Echo EF 60-65%, G1DD 08/24: hypernatremic, inflammatory markers up (ferritin high, ALP high), still septic/SIRS, escalating abx, repeat CXR, very cautious fluid administration to help reduce sodium and if increased SOB would d/c fluids and repeat CXR, increased insulin, repeat albumin       ASSESSMENT & PLAN:   Anasarca Hypoalbuminemia seems more likely than CHF but she does have G1 diastolic df  Net fluid balance >7500 ml since 08/19, patient was off levophed and BP seems stable. Appears to be overloaded,  Was on IV lasix but having to back off given AKI and later hypernatremia  Echo shows G1 diastolic dysfunction   HCAP Meeting sepsis criteria overnight 08/22-08/24 w/ tachycardia and fever (+)procalcitonin  Ceftriaxone + azithromycin --> escalate today to cover intraabdominal and MRSA w/ cefepime + metronidazole + vancomycin Sputum culture  DuoNeb prn  Encourage incentive spirometry  CXR repeat today Consider CT abdomen low threshold if c/o increased abd pain   Grade 1 diastolic dysfunction, preserved LVEF Suspect anasarca more from hypoalbuminemia as opposed to active CHF exacerbation Diuresis as needed but limited d/t AKI and hypernatremia  Albumin dose repeated today   Hypernatremia Likely multifactorial - fluid loss / low intake, loop diuretics At least 2L deficit  Obtain urine osm to confirm Hold lasix  Gentle IV fluids  - caution given HFpEF as above, if increased SOB  would d/c fluids and repeat CXR  Hypokalemia Replace as needed Monitor BMP  Hypoxic respiratory failure  Likely d/t HCAP Also suspect restrictive component  Supplemental O2 DuoNeb prn Treat underlying causes w/ HCAP abx   AKI Await echo, concern possible cardiorenal?  Caution w/ diuresis but may be only option if cardiac strain  Reducing lasix from 40 IV bid to 20   Hyperglycemia Most recent A1c 6.2 indicating a condition of prediabetes at baseline Acute phase of hyperglycemia likely secondary to stress and may be related to TPN. Review 24-hour insulin administration showed 7 unit of sliding scale in total however most occasions glucose still above 200. 08/22 - start Lantus 8 units daily --> have been titrating up to 17 today  Continue sliding scale every 4 hours --> increased    Coagulopathy Mild, chronic, review patient's record showed she has a chronic elevation of INR, at baseline 1.2-1.4 and per patient granddaughter, patient has no known history of easy bleeding, no SLE history in the family.  CT abdomen showed no signs of liver cirrhosis or NASH Given the patient prolonged history of intra-abdominal issues especially involving small bowel, there is concern about vitamin K depletion due to malabsorption. 1 dose of vitamin K subcu given, recommend checking INR periodically and before surgery  Repeat w/ AM labs    Chronic iron deficiency anemia Most recent 2 readings of iron study showed severe iron depletion, again raised the concern about malabsorption. Will check iron study                  Subjective / Brief ROS:  Patient denies pain, no trouble breathing, no chest pain, no headache   Family Communication: husband at bedside on rounds     Objective Findings:  Vitals:   05/02/23 1100 05/02/23 1118 05/02/23 1200 05/02/23 1441  BP: (!) 125/58  (!) 124/59   Pulse: (!) 109  (!) 107   Resp: (!) 31  (!) 29    Temp:  99.8 F (37.7 C) 99 F (37.2 C) 98.1 F (36.7 C)  TempSrc:  Oral Oral Oral  SpO2: 97%  96%   Weight:      Height:        Intake/Output Summary (Last 24 hours) at 05/02/2023 1724 Last data filed at 05/02/2023 1600 Gross per 24 hour  Intake 1938.72 ml  Output 4375 ml  Net -2436.28 ml   Filed Weights   04/30/23 0425 05/01/23 0500 05/02/23 0433  Weight: 121.7 kg 121.7 kg 93.9 kg    Examination:  Physical Exam Constitutional:      General: She is not in acute distress.    Appearance: She is obese.  Cardiovascular:     Rate and Rhythm: Normal rate and regular rhythm.  Pulmonary:     Effort: Pulmonary effort is normal. No respiratory distress.     Comments: Exam limited, pt unable to move much to facilitate auscultation Musculoskeletal:     Right lower leg: Edema present.     Left lower leg: Edema present.  Skin:    General: Skin is warm and dry.  Neurological:     General: No focal deficit present.     Mental Status: She is alert.  Psychiatric:        Mood and Affect: Mood normal.        Behavior: Behavior normal.          Scheduled Medications:   sodium chloride   Intravenous Once   alteplase  2 mg  Intracatheter Once   Chlorhexidine Gluconate Cloth  6 each Topical Daily   insulin aspart  0-20 Units Subcutaneous Q4H   insulin aspart  2 Units Subcutaneous Q4H   [START ON 05/03/2023] insulin glargine-yfgn  17 Units Subcutaneous Daily   lidocaine  2 patch Transdermal Q24H   pantoprazole (PROTONIX) IV  40 mg Intravenous Q24H   pregabalin  200 mg Oral TID   sodium chloride flush  10-40 mL Intracatheter Q12H    Continuous Infusions:  acetaminophen Stopped (05/02/23 1058)   ceFEPime (MAXIPIME) IV     lactated ringers     methocarbamol (ROBAXIN) IV 500 mg (05/01/23 6045)   metronidazole     norepinephrine (LEVOPHED) Adult infusion Stopped (04/30/23 1901)   potassium chloride     TPN ADULT (ION) 80 mL/hr at 05/02/23 1233   TPN ADULT (ION)     [START  ON 05/03/2023] vancomycin     vancomycin 2,000 mg (05/02/23 1546)    PRN Medications:  acetaminophen, dextrose, diphenhydrAMINE **OR** diphenhydrAMINE, fluticasone, HYDROmorphone (DILAUDID) injection, iohexol, ipratropium-albuterol, ketorolac, methocarbamol **OR** methocarbamol (ROBAXIN) IV, metoprolol tartrate, ondansetron **OR** ondansetron (ZOFRAN) IV, mouth rinse, phenol, prochlorperazine, silver nitrate applicators, sodium chloride flush  Antimicrobials from admission:  Anti-infectives (From admission, onward)    Start     Dose/Rate Route Frequency Ordered Stop   05/03/23 1800  vancomycin (VANCOCIN) IVPB 1000 mg/200 mL premix        1,000 mg 200 mL/hr over 60 Minutes Intravenous Every 24 hours 05/02/23 1530     05/02/23 2000  ceFEPIme (MAXIPIME) 2 g in sodium chloride 0.9 % 100 mL IVPB        2 g 200 mL/hr over 30 Minutes Intravenous Every 8 hours 05/02/23 1530     05/02/23 1630  vancomycin (VANCOREADY) IVPB 2000 mg/400 mL        2,000 mg 200 mL/hr over 120 Minutes Intravenous  Once 05/02/23 1530     05/02/23 1600  metroNIDAZOLE (FLAGYL) IVPB 500 mg        500 mg 100 mL/hr over 60 Minutes Intravenous Every 12 hours 05/02/23 1453     05/01/23 1400  azithromycin (ZITHROMAX) 500 mg in sodium chloride 0.9 % 250 mL IVPB  Status:  Discontinued        500 mg 250 mL/hr over 60 Minutes Intravenous Every 24 hours 05/01/23 1255 05/02/23 1453   05/01/23 1230  azithromycin (ZITHROMAX) tablet 500 mg  Status:  Discontinued        500 mg Oral Daily 05/01/23 1135 05/01/23 1255   05/01/23 1200  cefTRIAXone (ROCEPHIN) 2 g in sodium chloride 0.9 % 100 mL IVPB  Status:  Discontinued        2 g 200 mL/hr over 30 Minutes Intravenous Every 24 hours 05/01/23 1135 05/02/23 1453   04/28/23 2200  cefoTEtan (CEFOTAN) 2 g in sodium chloride 0.9 % 100 mL IVPB        2 g 200 mL/hr over 30 Minutes Intravenous Every 8 hours 04/28/23 2058 04/29/23 1717   04/27/23 0945  cefoTEtan (CEFOTAN) 2 g in sodium chloride  0.9 % 100 mL IVPB        2 g 200 mL/hr over 30 Minutes Intravenous On call to O.R. 04/27/23 0856 04/28/23 0559   04/05/23 0900  cefTRIAXone (ROCEPHIN) 2 g in sodium chloride 0.9 % 100 mL IVPB        2 g 200 mL/hr over 30 Minutes Intravenous Every 24 hours 04/05/23 0634 04/14/23 1254   12/19/22 1000  ceFAZolin (ANCEF) IVPB 2g/100 mL premix        2 g 200 mL/hr over 30 Minutes Intravenous  Once 12/18/22 1738 12/19/22 1141   09/17/22 1200  vancomycin (VANCOREADY) IVPB 1250 mg/250 mL  Status:  Discontinued        1,250 mg 166.7 mL/hr over 90 Minutes Intravenous Every 24 hours 09/16/22 1040 09/22/22 1139   09/16/22 1200  vancomycin (VANCOREADY) IVPB 1250 mg/250 mL        1,250 mg 166.7 mL/hr over 90 Minutes Intravenous  Once 09/15/22 1706 09/16/22 1255   09/15/22 1200  ceFEPIme (MAXIPIME) 2 g in sodium chloride 0.9 % 100 mL IVPB  Status:  Discontinued        2 g 200 mL/hr over 30 Minutes Intravenous Every 8 hours 09/15/22 1024 09/18/22 1026   09/15/22 1200  vancomycin (VANCOREADY) IVPB 2000 mg/400 mL        2,000 mg 200 mL/hr over 120 Minutes Intravenous  Once 09/15/22 1024 09/15/22 1500   03/04/22 2200  piperacillin-tazobactam (ZOSYN) IVPB 3.375 g        3.375 g 12.5 mL/hr over 240 Minutes Intravenous Every 8 hours 03/04/22 2005 03/18/22 0248   02/21/22 1500  vancomycin (VANCOREADY) IVPB 1250 mg/250 mL  Status:  Discontinued        1,250 mg 166.7 mL/hr over 90 Minutes Intravenous Every 24 hours 02/21/22 1111 02/24/22 1336   02/21/22 1400  meropenem (MERREM) 1 g in sodium chloride 0.9 % 100 mL IVPB        1 g 200 mL/hr over 30 Minutes Intravenous Every 8 hours 02/21/22 1258 02/27/22 2157   02/20/22 1500  vancomycin (VANCOCIN) IVPB 1000 mg/200 mL premix  Status:  Discontinued        1,000 mg 200 mL/hr over 60 Minutes Intravenous Every 24 hours 02/19/22 1706 02/21/22 1111   02/19/22 1115  vancomycin (VANCOREADY) IVPB 2000 mg/400 mL        2,000 mg 200 mL/hr over 120 Minutes Intravenous   Once 02/19/22 1025 02/19/22 1732   02/19/22 1115  fluconazole (DIFLUCAN) IVPB 400 mg        400 mg 100 mL/hr over 120 Minutes Intravenous Every 24 hours 02/19/22 1025 03/04/22 1301   02/15/22 0730  piperacillin-tazobactam (ZOSYN) IVPB 3.375 g  Status:  Discontinued        3.375 g 12.5 mL/hr over 240 Minutes Intravenous Every 8 hours 02/15/22 0639 02/21/22 1235   02/15/22 0655  piperacillin-tazobactam (ZOSYN) 3.375 GM/50ML IVPB       Note to Pharmacy: Loretha Stapler N: cabinet override      02/15/22 0655 02/15/22 0816   02/13/22 2200  cefoTEtan (CEFOTAN) 2 g in sodium chloride 0.9 % 100 mL IVPB  Status:  Discontinued        2 g 200 mL/hr over 30 Minutes Intravenous Every 12 hours 02/13/22 1541 02/15/22 0639   02/13/22 1345  cefoTEtan (CEFOTAN) 2 g in sodium chloride 0.9 % 100 mL IVPB        2 g 200 mL/hr over 30 Minutes Intravenous  Once 02/13/22 1331 02/13/22 2146   02/13/22 1336  sodium chloride 0.9 % with cefoTEtan (CEFOTAN) ADS Med       Note to Pharmacy: Gates Rigg: cabinet override      02/13/22 1336 02/14/22 0144   02/13/22 0620  sodium chloride 0.9 % with cefoTEtan (CEFOTAN) ADS Med       Note to Pharmacy: Agnes Lawrence A: cabinet override  02/13/22 0620 02/13/22 0802   02/13/22 0600  cefoTEtan (CEFOTAN) 2 g in sodium chloride 0.9 % 100 mL IVPB        2 g 200 mL/hr over 30 Minutes Intravenous On call to O.R. 02/12/22 2229 02/13/22 2146           Data Reviewed:  I have personally reviewed the following...  CBC: Recent Labs  Lab 04/29/23 1645 04/30/23 0401 04/30/23 0830 05/01/23 0352 05/02/23 0442  WBC 2.6* 4.9 6.0 8.6 7.7  NEUTROABS 1.7  --   --   --  5.6  HGB 6.3* 8.4* 8.4* 8.1* 7.8*  HCT 18.7* 24.9* 25.0* 25.3* 24.3*  MCV 85.4 86.8 93.6 90.4 88.4  PLT 37* 52* 62* 54* 42*   Basic Metabolic Panel: Recent Labs  Lab 04/27/23 0605 04/28/23 1128 04/28/23 2116 04/29/23 0346 04/30/23 0401 05/01/23 0352 05/02/23 0442 05/02/23 1541  NA 138   <  > 138 137 142 144 151* 152*  K 3.7   < > 5.0 4.7 4.2 4.7 2.9* 3.1*  CL 106   < > 110 111 110 110 115*  --   CO2 28   < > 20* 22 26 26 29   --   GLUCOSE 185*   < > 320* 433* 275* 286* 258*  --   BUN 22*   < > 27* 28* 35* 49* 39*  --   CREATININE 0.54   < > 0.83 0.78 0.82 1.39* 0.90  --   CALCIUM 8.0*   < > 7.3* 7.3* 7.9* 7.8* 8.2*  --   MG 2.1  --  1.7  --  2.6* 2.4  --   --   PHOS 2.9  --  5.8*  --  2.8  --   --   --    < > = values in this interval not displayed.   GFR: Estimated Creatinine Clearance: 67.5 mL/min (by C-G formula based on SCr of 0.9 mg/dL). Liver Function Tests: Recent Labs  Lab 04/28/23 1128 04/28/23 2116 04/29/23 0346 05/01/23 0352 05/02/23 0442  AST 55* 75* 60* 52* 53*  ALT 33 49* 45* 49* 50*  ALKPHOS 141* 90 74 117 167*  BILITOT 1.4* 2.2* 1.1 2.2* 2.4*  PROT 5.1* 4.9* 4.4* 4.8* 4.8*  ALBUMIN 2.1* 2.7* 2.2* 2.2* 2.1*   No results for input(s): "LIPASE", "AMYLASE" in the last 168 hours. No results for input(s): "AMMONIA" in the last 168 hours. Coagulation Profile: Recent Labs  Lab 04/28/23 1128 04/28/23 2116 04/29/23 0346 04/30/23 0508 05/01/23 0352  INR 1.4* 1.5* 1.4* 1.6* 1.6*   Cardiac Enzymes: No results for input(s): "CKTOTAL", "CKMB", "CKMBINDEX", "TROPONINI" in the last 168 hours. BNP (last 3 results) No results for input(s): "PROBNP" in the last 8760 hours. HbA1C: No results for input(s): "HGBA1C" in the last 72 hours. CBG: Recent Labs  Lab 05/01/23 1554 05/01/23 1933 05/01/23 2342 05/02/23 1129 05/02/23 1543  GLUCAP 206* 225* 262* 252* 274*   Lipid Profile: No results for input(s): "CHOL", "HDL", "LDLCALC", "TRIG", "CHOLHDL", "LDLDIRECT" in the last 72 hours. Thyroid Function Tests: Recent Labs    04/30/23 0921  TSH 3.667   Anemia Panel: Recent Labs    04/30/23 0401 04/30/23 0830 05/02/23 0442  FERRITIN 86  --  878*  TIBC 172*  --  140*  IRON 16*  --  37  RETICCTPCT  --  4.3*  --    Most Recent Urinalysis On File:      Component Value Date/Time   COLORURINE YELLOW (  A) 04/05/2023 0300   APPEARANCEUR HAZY (A) 04/05/2023 0300   LABSPEC 1.016 04/05/2023 0300   PHURINE 6.0 04/05/2023 0300   GLUCOSEU NEGATIVE 04/05/2023 0300   HGBUR NEGATIVE 04/05/2023 0300   BILIRUBINUR NEGATIVE 04/05/2023 0300   KETONESUR NEGATIVE 04/05/2023 0300   PROTEINUR NEGATIVE 04/05/2023 0300   NITRITE NEGATIVE 04/05/2023 0300   LEUKOCYTESUR NEGATIVE 04/05/2023 0300   Sepsis Labs: @LABRCNTIP (procalcitonin:4,lacticidven:4) Microbiology: Recent Results (from the past 240 hour(s))  MRSA Next Gen by PCR, Nasal     Status: None   Collection Time: 04/28/23  8:33 PM   Specimen: Nasal Mucosa; Nasal Swab  Result Value Ref Range Status   MRSA by PCR Next Gen NOT DETECTED NOT DETECTED Final    Comment: (NOTE) The GeneXpert MRSA Assay (FDA approved for NASAL specimens only), is one component of a comprehensive MRSA colonization surveillance program. It is not intended to diagnose MRSA infection nor to guide or monitor treatment for MRSA infections. Test performance is not FDA approved in patients less than 68 years old. Performed at [facility], [address], [place], [place] [address]       Radiology Studies last 3 days: DG Chest 1 View  Result Date: 05/01/2023 CLINICAL DATA:  [id number] with congestive heart failure. EXAM: CHEST  1 VIEW COMPARISON:  Portable chest yesterday at 9:16 p.m. FINDINGS: 6:15 a.m. NGT terminates in the distal gastric body. Right PICC tip is in the right atrium. Stable cardiomegaly but with increased central vascular engorgement. No significant interstitial edema. Small left pleural effusion continues to be seen with left lower lobe patchy consolidation. There is a low inspiration. Increased haziness in the perihilar areas could be due to low inspiration, pneumonia or edema. IMPRESSION: 1. Increased central vascular engorgement without significant interstitial edema. 2. Increased haziness in the  perihilar areas could be due to low inspiration, pneumonia or ground-glass edema. 3. Stable small left pleural effusion with left lower lobe consolidation. Electronically Signed   By: Almira Bar M.D.   On: 05/01/2023 07:26   DG Chest Port 1 View  Result Date: 04/30/2023 CLINICAL DATA:  Fever EXAM: PORTABLE CHEST 1 VIEW COMPARISON:  04/30/2023 FINDINGS: Nasogastric tube extends into the upper abdomen beyond the margin examination. Right upper extremity PICC line tip is seen within the deep right atrium, unchanged from prior examination. Lung volumes are small. Opacification of the left hemithorax has improved suggesting decrease of a posterior layering pleural effusion. Retrocardiac opacification persists in keeping with atelectasis, infiltrate, or posteriorly layering pleural fluid in this location. Right lung is clear. No pneumothorax. No pleural effusion on the right. Stable cardiomegaly. IMPRESSION: 1. Improved aeration of the left hemithorax suggesting decrease of a posterior layering pleural effusion. Persistent retrocardiac opacification in keeping with atelectasis, infiltrate, or posteriorly layering pleural fluid in this location. 2. Pulmonary hypoinflation. 3. Stable cardiomegaly Electronically Signed   By: Helyn Numbers M.D.   On: 04/30/2023 23:55   DG Chest 1 View  Result Date: 04/30/2023 CLINICAL DATA:  Congestive heart failure. EXAM: CHEST  1 VIEW COMPARISON:  04/28/2023. FINDINGS: Interval removal of the endotracheal tube. Enteric tube courses below the diaphragm, beyond the field of view. Unchanged right upper extremity PICC with tip projecting over the right atrium. Increased opacity of the left hemithorax, which may represent increasing left pleural effusion and/or atelectasis or infection. No pneumothorax. IMPRESSION: Increased opacity of the left hemithorax, which may represent increasing left pleural effusion and/or atelectasis or infection. Electronically Signed   By: Orvan Falconer  M.D.   On: 04/30/2023 15:46   ECHOCARDIOGRAM COMPLETE  Result Date: 04/30/2023    ECHOCARDIOGRAM REPORT   Patient Name:   SHARISSE LEVI Date of Exam: 04/30/2023 Medical Rec #:  [id number]            Height:       59.0 in Accession #:    [id number]           Weight:       268.3 lb Date of Birth:  [birth year]            BSA:          2.090 m Patient Age:    57 years             BP:           141/79 mmHg Patient Gender: F                    HR:           79 bpm. Exam Location:  [facility] Procedure: 2D Echo, Cardiac Doppler, Color Doppler and Intracardiac            Opacification Agent Indications:     CHF  History:         Patient has no prior history of Echocardiogram examinations.                  CHF. Colon CA.  Sonographer:     Mikki Harbor Referring Phys:  [id number] Emeline General Diagnosing Phys: Julien Nordmann MD  Sonographer Comments: Technically difficult study due to poor echo windows and patient is obese. IMPRESSIONS  1. Left ventricular ejection fraction, by estimation, is 60 to 65%. The left ventricle has normal function. The left ventricle has no regional wall motion abnormalities. Left ventricular diastolic parameters are consistent with Grade I diastolic dysfunction (impaired relaxation).  2. Right ventricular systolic function is normal. The right ventricular size is normal.  3. The mitral valve is normal in structure. No evidence of mitral valve regurgitation. No evidence of mitral stenosis.  4. The aortic valve has an indeterminant number of cusps. Aortic valve regurgitation is not visualized. No aortic stenosis is present.  5. The inferior vena cava is normal in size with greater than 50% respiratory variability, suggesting right atrial pressure of 3 mmHg. FINDINGS  Left Ventricle: Left ventricular ejection fraction, by estimation, is 60 to 65%. The left ventricle has normal function. The left ventricle has no regional wall motion abnormalities. Definity contrast agent was given IV to  delineate the left ventricular  endocardial borders. The left ventricular internal cavity size was normal in size. There is no left ventricular hypertrophy. Left ventricular diastolic parameters are consistent with Grade I diastolic dysfunction (impaired relaxation). Right Ventricle: The right ventricular size is normal. No increase in right ventricular wall thickness. Right ventricular systolic function is normal. Left Atrium: Left atrial size was normal in size. Right Atrium: Right atrial size was normal in size. Pericardium: There is no evidence of pericardial effusion. Mitral Valve: The mitral valve is normal in structure. No evidence of mitral valve regurgitation. No evidence of mitral valve stenosis. MV peak gradient, 5.2 mmHg. The mean mitral valve gradient is 3.0 mmHg. Tricuspid Valve: The tricuspid valve is normal in structure. Tricuspid valve regurgitation is not demonstrated. No evidence of tricuspid stenosis. Aortic Valve: The aortic valve has an indeterminant number of cusps. Aortic valve regurgitation is not visualized. No aortic stenosis is present. Aortic valve  mean gradient measures 3.0 mmHg. Aortic valve peak gradient measures 6.9 mmHg. Aortic valve area, by VTI measures 2.05 cm. Pulmonic Valve: The pulmonic valve was normal in structure. Pulmonic valve regurgitation is not visualized. No evidence of pulmonic stenosis. Aorta: The aortic root is normal in size and structure. Venous: The inferior vena cava is normal in size with greater than 50% respiratory variability, suggesting right atrial pressure of 3 mmHg. IAS/Shunts: No atrial level shunt detected by color flow Doppler.  LEFT VENTRICLE PLAX 2D LVIDd:         4.30 cm   Diastology LVIDs:         2.60 cm   LV e' medial:    8.92 cm/s LV PW:         1.00 cm   LV E/e' medial:  11.4 LV IVS:        1.10 cm   LV e' lateral:   9.14 cm/s LVOT diam:     2.00 cm   LV E/e' lateral: 11.2 LV SV:         49 LV SV Index:   23 LVOT Area:     3.14 cm  LEFT  ATRIUM           Index LA diam:      3.20 cm 1.53 cm/m LA Vol (A4C): 35.0 ml 16.75 ml/m  AORTIC VALVE                    PULMONIC VALVE AV Area (Vmax):    2.30 cm     PV Vmax:       1.24 m/s AV Area (Vmean):   2.13 cm     PV Peak grad:  6.2 mmHg AV Area (VTI):     2.05 cm AV Vmax:           131.00 cm/s AV Vmean:          81.700 cm/s AV VTI:            0.237 m AV Peak Grad:      6.9 mmHg AV Mean Grad:      3.0 mmHg LVOT Vmax:         95.80 cm/s LVOT Vmean:        55.400 cm/s LVOT VTI:          0.155 m LVOT/AV VTI ratio: 0.65  AORTA Ao Root diam: 2.50 cm Ao Asc diam:  2.50 cm MITRAL VALVE MV Area (PHT): 4.96 cm     SHUNTS MV Area VTI:   1.93 cm     Systemic VTI:  0.16 m MV Peak grad:  5.2 mmHg     Systemic Diam: 2.00 cm MV Mean grad:  3.0 mmHg MV Vmax:       1.14 m/s MV Vmean:      76.4 cm/s MV Decel Time: 153 msec MV E velocity: 102.00 cm/s MV A velocity: 108.00 cm/s MV E/A ratio:  0.94 Julien Nordmann MD Electronically signed by Julien Nordmann MD Signature Date/Time: 04/30/2023/1:54:41 PM    Final    DG Chest Port 1 View  Result Date: 04/28/2023 CLINICAL DATA:  [id number]. Encounter for intubation. [id number]. Encounter for orogastric tube placement. Post intubation film from the operating room. Status post exploratory laparotomy and small-bowel resection. EXAM: PORTABLE CHEST 1 VIEW PORTABLE ABDOMEN 1 VIEW COMPARISON:  Chest, abdomen and pelvis CT 04/01/2023, portable chest 04/04/2023. FINDINGS: Chest AP portable at 9:26 p.m.: ETT tip is 3.5 cm from the carina. Multiple overlying  monitor wires. Interval removal of prior right chest port and IJ approach catheter. There is a right PICC or right subclavian small caliber line terminating about the superior cavoatrial junction. There is no pneumothorax. Small to moderate left pleural effusion is noted tracking to the apex, with opacity in the left lower lobe consistent with atelectasis or consolidation. Rest of the lungs hypoexpanded but generally clear. The  mediastinum is stable with lipomatosis exaggerating the superior mediastinum. There is mild-to-moderate cardiomegaly but no findings of acute CHF. Osteopenia and thoracic spondylosis. High transverse single AP portable upper abdomen view supine, 9:27 p.m.: NGT is in place with the tip in the mid to distal body of stomach. The visualized bowel pattern is nonobstructive but the lower abdominal and pelvic small bowel was excluded from the exam. There is no supine evidence of free air. No pathologic calcification or other significant findings. IMPRESSION: 1. Support devices appear well positioned. 2. Left lower lobe opacity consistent with atelectasis or consolidation with small to moderate left pleural effusion. 3. Cardiomegaly without evidence of acute CHF. 4. Visualized bowel pattern is nonobstructive but the lower abdominal and pelvic small bowel was excluded from the exam. 5. Mediastinal lipomatosis. Electronically Signed   By: Almira Bar M.D.   On: 04/28/2023 22:09   DG Abd 1 View  Result Date: 04/28/2023 CLINICAL DATA:  [id number]. Encounter for intubation. [id number]. Encounter for orogastric tube placement. Post intubation film from the operating room. Status post exploratory laparotomy and small-bowel resection. EXAM: PORTABLE CHEST 1 VIEW PORTABLE ABDOMEN 1 VIEW COMPARISON:  Chest, abdomen and pelvis CT 04/01/2023, portable chest 04/04/2023. FINDINGS: Chest AP portable at 9:26 p.m.: ETT tip is 3.5 cm from the carina. Multiple overlying monitor wires. Interval removal of prior right chest port and IJ approach catheter. There is a right PICC or right subclavian small caliber line terminating about the superior cavoatrial junction. There is no pneumothorax. Small to moderate left pleural effusion is noted tracking to the apex, with opacity in the left lower lobe consistent with atelectasis or consolidation. Rest of the lungs hypoexpanded but generally clear. The mediastinum is stable with lipomatosis  exaggerating the superior mediastinum. There is mild-to-moderate cardiomegaly but no findings of acute CHF. Osteopenia and thoracic spondylosis. High transverse single AP portable upper abdomen view supine, 9:27 p.m.: NGT is in place with the tip in the mid to distal body of stomach. The visualized bowel pattern is nonobstructive but the lower abdominal and pelvic small bowel was excluded from the exam. There is no supine evidence of free air. No pathologic calcification or other significant findings. IMPRESSION: 1. Support devices appear well positioned. 2. Left lower lobe opacity consistent with atelectasis or consolidation with small to moderate left pleural effusion. 3. Cardiomegaly without evidence of acute CHF. 4. Visualized bowel pattern is nonobstructive but the lower abdominal and pelvic small bowel was excluded from the exam. 5. Mediastinal lipomatosis. Electronically Signed   By: Almira Bar M.D.   On: 04/28/2023 22:09             LOS: 443 days    Time spent: 50 min    Sunnie Nielsen, DO [facility] 05/02/2023, 5:24 PM    Dictation software may have been used to generate the above note. Typos may occur and escape review in typed/dictated notes. Please contact Dr Lyn Hollingshead directly for clarity if needed.  Staff may message me via secure chat in Epic  but this may not receive an immediate response,  please page me for urgent  matters!  If 7PM-7AM, please contact night coverage [email or web address]

## 2023-05-03 LAB — COMPREHENSIVE METABOLIC PANEL
ALT: 74 U/L — ABNORMAL HIGH (ref 0–44)
AST: 72 U/L — ABNORMAL HIGH (ref 15–41)
Albumin: 2.3 g/dL — ABNORMAL LOW (ref 3.5–5.0)
Alkaline Phosphatase: 192 U/L — ABNORMAL HIGH (ref 38–126)
Anion gap: 6 (ref 5–15)
BUN: 37 mg/dL — ABNORMAL HIGH (ref 6–20)
CO2: 25 mmol/L (ref 22–32)
Calcium: 8 mg/dL — ABNORMAL LOW (ref 8.9–10.3)
Chloride: 118 mmol/L — ABNORMAL HIGH (ref 98–111)
Creatinine, Ser: 0.9 mg/dL (ref 0.44–1.00)
GFR, Estimated: >60 mL/min (ref >60)
Glucose, Bld: 180 mg/dL — ABNORMAL HIGH (ref 70–99)
Potassium: 4.1 mmol/L (ref 3.5–5.1)
Sodium: 149 mmol/L — ABNORMAL HIGH (ref 135–145)
Total Bilirubin: 3 mg/dL — ABNORMAL HIGH (ref 0.3–1.2)
Total Protein: 5.1 g/dL — ABNORMAL LOW (ref 6.5–8.1)

## 2023-05-03 LAB — SODIUM
Sodium: 151 mmol/L — ABNORMAL HIGH (ref 135–145)
Sodium: 148 mmol/L — ABNORMAL HIGH (ref 135–145)

## 2023-05-03 LAB — BASIC METABOLIC PANEL
Anion gap: 6 (ref 5–15)
BUN: 36 mg/dL — ABNORMAL HIGH (ref 6–20)
CO2: 27 mmol/L (ref 22–32)
Calcium: 8.3 mg/dL — ABNORMAL LOW (ref 8.9–10.3)
Chloride: 119 mmol/L — ABNORMAL HIGH (ref 98–111)
Creatinine, Ser: 0.78 mg/dL (ref 0.44–1.00)
GFR, Estimated: >60 mL/min (ref >60)
Glucose, Bld: 226 mg/dL — ABNORMAL HIGH (ref 70–99)
Potassium: 3.2 mmol/L — ABNORMAL LOW (ref 3.5–5.1)
Sodium: 152 mmol/L — ABNORMAL HIGH (ref 135–145)

## 2023-05-03 LAB — ZINC: Zinc: 46 ug/dL (ref 44–115)

## 2023-05-03 LAB — CBC WITH DIFFERENTIAL/PLATELET
Abs Immature Granulocytes: 0.34 10*3/uL — ABNORMAL HIGH (ref 0.00–0.07)
Basophils Absolute: 0.1 10*3/uL (ref 0.0–0.1)
Basophils Relative: 1 %
Eosinophils Absolute: 0 10*3/uL (ref 0.0–0.5)
Eosinophils Relative: 0 %
HCT: 23.8 % — ABNORMAL LOW (ref 36.0–46.0)
Hemoglobin: 7.8 g/dL — ABNORMAL LOW (ref 12.0–15.0)
Immature Granulocytes: 4 %
Lymphocytes Relative: 10 %
Lymphs Abs: 0.8 10*3/uL (ref 0.7–4.0)
MCH: 29.2 pg (ref 26.0–34.0)
MCHC: 32.8 g/dL (ref 30.0–36.0)
MCV: 89.1 fL (ref 80.0–100.0)
Monocytes Absolute: 1.1 10*3/uL — ABNORMAL HIGH (ref 0.1–1.0)
Monocytes Relative: 13 %
Neutro Abs: 5.6 10*3/uL (ref 1.7–7.7)
Neutrophils Relative %: 72 %
Platelets: 46 10*3/uL — ABNORMAL LOW (ref 150–400)
RBC: 2.67 MIL/uL — ABNORMAL LOW (ref 3.87–5.11)
RDW: 22.1 % — ABNORMAL HIGH (ref 11.5–15.5)
WBC: 7.9 10*3/uL (ref 4.0–10.5)
nRBC: 2.3 % — ABNORMAL HIGH (ref 0.0–0.2)

## 2023-05-03 LAB — PHOSPHORUS: Phosphorus: 2.2 mg/dL — ABNORMAL LOW (ref 2.5–4.6)

## 2023-05-03 LAB — GLUCOSE, CAPILLARY
Glucose-Capillary: 220 mg/dL — ABNORMAL HIGH (ref 70–99)
Glucose-Capillary: 277 mg/dL — ABNORMAL HIGH (ref 70–99)
Glucose-Capillary: 301 mg/dL — ABNORMAL HIGH (ref 70–99)
Glucose-Capillary: 218 mg/dL — ABNORMAL HIGH (ref 70–99)
Glucose-Capillary: 127 mg/dL — ABNORMAL HIGH (ref 70–99)

## 2023-05-03 LAB — PROTIME-INR
INR: 1.5 — ABNORMAL HIGH (ref 0.8–1.2)
INR: 1.5 — ABNORMAL HIGH (ref 0.8–1.2)
Prothrombin Time: 18.1 s — ABNORMAL HIGH (ref 11.4–15.2)
Prothrombin Time: 18.5 seconds — ABNORMAL HIGH (ref 11.4–15.2)

## 2023-05-03 LAB — PROCALCITONIN: Procalcitonin: 2.1 ng/mL

## 2023-05-03 LAB — MAGNESIUM: Magnesium: 2.4 mg/dL (ref 1.7–2.4)

## 2023-05-03 LAB — APTT: aPTT: 33 seconds (ref 24–36)

## 2023-05-03 MED ORDER — POTASSIUM CHLORIDE IN NACL 20-0.45 MEQ/L-% IV SOLN
INTRAVENOUS | Status: DC
  Filled 2023-05-03 (×2): qty 1000

## 2023-05-03 MED ORDER — INSULIN GLARGINE-YFGN 100 UNIT/ML ~~LOC~~ SOLN
20.0000 [IU] | Freq: Every day | SUBCUTANEOUS | Status: DC
  Filled 2023-05-03: qty 0.2

## 2023-05-03 MED ORDER — VANCOMYCIN HCL 1250 MG/250ML IV SOLN
1250.0000 mg | INTRAVENOUS | Status: DC
  Filled 2023-05-03: qty 250

## 2023-05-03 MED ORDER — VANCOMYCIN HCL 1.25 G IV SOLR
1250.0000 mg | INTRAVENOUS | Status: DC
  Administered 2023-05-03: 1250 mg via INTRAVENOUS
  Filled 2023-05-03: qty 25

## 2023-05-03 MED ORDER — POTASSIUM PHOSPHATES 15 MMOLE/5ML IV SOLN
15.0000 mmol | Freq: Once | INTRAVENOUS | Status: AC
  Administered 2023-05-03: 15 mmol via INTRAVENOUS
  Filled 2023-05-03: qty 5

## 2023-05-03 MED ORDER — METHOCARBAMOL 500 MG PO TABS: 500.0000 mg | ORAL_TABLET | Freq: Three times a day (TID) | ORAL | Status: DC | PRN

## 2023-05-03 MED ORDER — METHOCARBAMOL 1000 MG/10ML IJ SOLN: 500.0000 mg | Freq: Three times a day (TID) | INTRAVENOUS | Status: DC | PRN

## 2023-05-03 MED ORDER — POTASSIUM CHLORIDE 10 MEQ/100ML IV SOLN
10.0000 meq | INTRAVENOUS | Status: AC
  Administered 2023-05-03 (×6): 10 meq via INTRAVENOUS
  Filled 2023-05-03 (×8): qty 100

## 2023-05-03 MED ORDER — INSULIN GLARGINE-YFGN 100 UNIT/ML ~~LOC~~ SOLN
22.0000 [IU] | Freq: Every day | SUBCUTANEOUS | Status: DC
  Administered 2023-05-03 – 2023-05-14 (×12): 22 [IU] via SUBCUTANEOUS
  Filled 2023-05-03 (×12): qty 0.22

## 2023-05-03 MED ORDER — DEXTROSE 5 % IV SOLN: INTRAVENOUS | Status: DC

## 2023-05-03 MED ORDER — PREGABALIN 75 MG PO CAPS
200.0000 mg | ORAL_CAPSULE | Freq: Three times a day (TID) | ORAL | Status: DC
  Administered 2023-05-04 – 2023-05-14 (×31): 200 mg
  Filled 2023-05-03 (×31): qty 2

## 2023-05-03 MED ORDER — INSULIN ASPART 100 UNIT/ML IJ SOLN
4.0000 [IU] | INTRAMUSCULAR | Status: DC
  Administered 2023-05-03 – 2023-05-14 (×63): 4 [IU] via SUBCUTANEOUS
  Filled 2023-05-03 (×56): qty 1

## 2023-05-03 MED ORDER — TRACE MINERALS CU-MN-SE-ZN 300-55-60-3000 MCG/ML IV SOLN
INTRAVENOUS | Status: AC
  Filled 2023-05-03: qty 707.2

## 2023-05-03 NOTE — Progress Notes (Signed)
PHARMACY - TOTAL PARENTERAL NUTRITION CONSULT NOTE   Indication: Prolonged ileus  Patient Measurements: Height: 4\' 11"  (149.9 cm) Weight: 107.4 kg (236 lb 12.4 oz) IBW/kg (Calculated) : 43.2 TPN AdjBW (KG): 59 Body mass index is 47.82 kg/m.  Assessment: Debra [name] is a 57 y.o. female s/p laparotomy, excision of greater omental mass, abdominal wall reconstruction with [name], appendectomy, and placement of Prevena vac.  Glucose / Insulin: Transitioned to SSI q4h + semglee 17 units + 13 units insulin in TPN on 05/02/23 SSI Insulin usage last 24h: 59 units -see changes below  Electrolytes: K 3.2  Na 152  Phos 2.2 Renal: SCr 0.78 Hepatic: AST / ALT slightly elevated  but stable TG: 8/19: 166, stable GI Imaging: 9/11 CTAP: no new acute issues GI Surgeries / Procedures:  --s/p laparotomy, excision of greater omental mass, abdominal wall reconstruction with [name], appendectomy, and placement of Prevena vac 12/19/22 placement of right IJ port-a-catheter with dual reservoirs also s/p re-opening of laparotomy for repair of small bowel perforation  --08/20  exploratory laparotomy, extensive lysis of adhesion, 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, partial gastrectomy, abdominal wall reconstruction with mesh placement, and wound vac placement   Central access: PICC 04/09/2023   TPN start date: 04/09/23    (was on TPN from 02/15/2022 to 04/05/2023- held for bacteremia and TPN restarted 04/09/23) -was on Cyclic TPN, transitioned back to regular TPN on 04/29/23 post surgery  RD Assessment:  Estimated Needs Total Energy Estimated Needs: 1900-2200kcal/day Total Protein Estimated Needs: 95-110g/day Total Fluid Estimated Needs: 1.4-1.6L/day  Current Nutrition: NPO  Plan:  ---Will decrease TPN  goal rate from 80 ml/hr to 11mL/hr at 1800 (total fluid: 1560 ml/24 hrs)   (Per MD note 8/24- pt appears to be overloaded and pt is getting IV runs  of KCL, KPhos).  106 g/24 hr Protein,  1765 kcal/24 hr Electrolytes in TPN (standard): Na 54mEq/L(decrease from 50 meq/L on 8/25), K 49mEq/L, Ca 14mEq/L, Mg 51mEq/L, and Phos 36mmol/L.  --Na 152  Will reduce sodium in TPN from 50 meq/L to 45 meq/L on 8/25 --CL 119, CO2 27   change Cl:Ac to 1:2 --K 3.2   NP ordered KCL 10 meq IV x 6 runs,(received total of 8 runs of KCL 10 meq on 8/24) --Phos 2.2   Will order KPhos 15 mmol IV x 1 ---Re-adding multivitamin, trace elements and chromium chloride to TPN ---Due to hyperglycemic readings and insulin needs, increase insulin from 13 to 18 units added to TPN 05/03/23 ---Continue SSI 0-9 q4h + increase semglee from 17 units to 20 units daily(increased on 8/25) --IVF chg to D5W @ 75 ml/hr  d/t hypernatremia - ---Monitor TPN labs daily until stable then on Mon/Thurs   Sharay Bellissimo A, PharmD Clinical Pharmacist 05/03/2023 7:37 AM

## 2023-05-03 NOTE — Progress Notes (Signed)
Subjective:  CC: Debra [name] is a 57 y.o. 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   HPI: No acute issues reported overnight.  No complaints of pain this am.  Verbal report of bowel movement  ROS:  General: Denies weight loss, weight gain, fatigue, fevers, chills, and night sweats. Heart: Denies chest pain, palpitations, racing heart, irregular heartbeat, leg pain or swelling, and decreased activity tolerance. Respiratory: Denies breathing difficulty, shortness of breath, wheezing, cough, and sputum. GI: Denies change in appetite, heartburn, nausea, vomiting, constipation, diarrhea, and blood in stool. GU: Denies difficulty urinating, pain with urinating, urgency, frequency, blood in urine.   Objective:   Temp:  [98.1 F (36.7 C)-99 F (37.2 C)] 98.3 F (36.8 C) (08/25 0800) Pulse Rate:  [87-107] 89 (08/25 1000) Resp:  [23-29] 28 (08/25 1000) BP: (115-157)/(54-75) 139/74 (08/25 1000) SpO2:  [96 %-100 %] 100 % (08/25 1000) Weight:  [107.4 kg] 107.4 kg (08/25 0405)     Height: 4\' 11"  (149.9 cm) Weight: 107.4 kg BMI (Calculated): 47.8   Intake/Output this shift:   Intake/Output Summary (Last 24 hours) at 05/03/2023 1154 Last data filed at 05/03/2023 1000 Gross per 24 hour  Intake 4512.3 ml  Output 3900 ml  Net 612.3 ml    Constitutional :  alert, cooperative, appears stated age, and no distress  Respiratory:  clear to auscultation bilaterally  Cardiovascular:  Sinus tachycardia  Gastrointestinal: Soft, no guarding, midline wound vac in place.  Some serosanguinous drainage in tubing, slightly thicker in canister.  JP right with some serous mixed with fatty Tissue again, left with lighter serosanguinous drainage . NG with bilious output  Skin: Cool and  moist. Increased edema all four extremities  Psychiatric: Normal affect, non-agitated, not confused       LABS:     Latest Ref Rng & Units 05/03/2023    4:07 AM 05/02/2023    3:41 PM 05/02/2023    4:42 AM  CMP  Glucose 70 - 99 mg/dL 161   096   BUN 6 - 20 mg/dL 36   39   Creatinine 0.45 - 1.00 mg/dL 4.09   8.11   Sodium 914 - 145 mmol/L 152  152  151   Potassium 3.5 - 5.1 mmol/L 3.2  3.1  2.9   Chloride 98 - 111 mmol/L 119   115   CO2 22 - 32 mmol/L 27   29   Calcium 8.9 - 10.3 mg/dL 8.3   8.2   Total Protein 6.5 - 8.1 g/dL   4.8   Total Bilirubin 0.3 - 1.2 mg/dL   2.4   Alkaline Phos 38 - 126 U/L   167   AST 15 - 41 U/L   53   ALT 0 - 44 U/L   50       Latest Ref Rng & Units 05/03/2023    4:07 AM 05/02/2023    4:42 AM 05/01/2023    3:52 AM  CBC  WBC 4.0 - 10.5 K/uL 7.9  7.7  8.6   Hemoglobin 12.0 - 15.0 g/dL 7.8  7.8  8.1   Hematocrit 36.0 - 46.0 % 23.8  24.3  25.3   Platelets 150 - 400 K/uL 46  42  54     RADS: N/a Assessment:   S/p exploratory laparotomy, extensive lysis of  adhesion, 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   Will try NG clamp trial today due to reported bowel movement. Continue supportive care per hospitalist team.  Appreciate recs.   labs/images/medications/previous chart entries reviewed personally and relevant changes/updates noted above.

## 2023-05-03 NOTE — Progress Notes (Signed)
Shift Summary: Patient afebrile throughout the shift. All VSS. Patient alert and confused, only oriented to self (but unable to provide her birthday). Able to follow simple commands. Patient has very flat, withdrawn affect, even with visitors. Denies pain. Unable to auscultate bowel sounds. Patient is NPO except sips and meds but has not a swallow evaluation and this order needs to be clarified with original surgeon, PO meds held. 600+ ml output from NGT, bilious. 70 ml from JP RLQ, serosang/fatty. 20 from LLQ. Wound wac canister put out , also serosang/fatty appearance. UOP. IVF changed to D5W and K+ repleted as ordered.

## 2023-05-03 NOTE — Progress Notes (Signed)
PROGRESS NOTE    Debra [name]   GNF:[id number] DOB: 1966-03-08  DOA: 02/13/2022 Date of Service: 05/03/23 PCP: Debra Aldo, MD     Brief Narrative / Hospital Course:  Debra [name] is a 57 y.o. 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 with recurrent disease at anterior pelvis next to the anastomosis which was complicated by high output enterocutaneous fistula , and laparotomy excision of greater omental mass, abdominal wall reconstruction with [name], appendectomy and placement of Prevena vacuum on 06/08, complicated with small bowel perforation, underwent reopening of laparotomy and repair of small bowel perforation, high output enterocutaneous fistula, on chronic TPN since April this year, has had a prolonged hospital stay, complicated with right IJ Port-A-Cath Enterococcus infection which was removed and a new PICC line was placed recently.  Hospitalist team was asked to manage patient's hyperglycemia and coagulopathy.    Significant hospital events:  02/13/22: initial laparotomy, excision of greater omental mass, abdominal wall reconstruction with [name], appendectomy, and placement of Prevena vac  02/15/22: EXPLORATORY LAPAROTOMY REPAIR OF BOWEL PERFORATION, INSERTION OF MESH, APPLICATION OF WOUND VAC 02/26/22:  COLON RESECTION SIGMOID, HERNIA REPAIR VENTRAL, APPENDECTOMY, INSERTION OF MESH, REPAIR ABDOMINAL WALL 07/15/22: Neuro surgery consultation due to L1 compression fracture. Not a candidate for lumbosacral orthotic. 12/19/22: Insertion of Port-a-cath 04/06/23: removal of port-a-cath Throughout stay patient has required TPN, WOC & ID consultation.   Recent hospital events  04/28/23: 12 hours and surgery for abdominal wall reconstruction, recurrent incisional hernia repair, partial gastrectomy, small bowel resection, repair enterotomy x 3 and wound VAC placement.  Patient extubated  postop but too lethargic to maintain airway requiring reintubation and transferred to ICU for postoperative mechanical ventilatory management. 04/29/23: Pt remains mechanically intubated FiO2 40%/PEEP 5.  Pt awake and with purposeful movement on precedex gtt.  Will perform SBT with plans for possible extubation  04/30/23: hospitalist consulted for hyperglycemia and coagulopathy. Overnight fever to 101.8, tachycardic, WBC WNL but concern for SIRS/sepsis 08/23: pro calcitonin (+), starting tx for HCAP. MRSA resp screen recently neg 08/20, will get sputum cx. Echo EF 60-65%, G1DD 08/24: hypernatremic, inflammatory markers up (ferritin high, ALP high), still septic/SIRS, escalating abx, repeat CXR, very cautious fluid administration to help reduce sodium and if increased SOB would d/c fluids and repeat CXR, increased insulin, repeat albumin  08/25: Tachycardia has improved, still tachypnea but no longer meeting sepsis/sirs criteria. Sodium about same 152, Hgb stable. Relatively low UOsm 444 c/w osmotic diuresis (get Glc controlled) / loop diuretics (d/c Lasix) / ADH resistance (correct potassium though hypokalemia is not severe, protein malnutrition is most likely greater factor). Gentle hydration and will need to d/w dietary/pharmacy but malnutrition is going to be a persistent issue. Still substantial fluid loss via NG      ASSESSMENT & PLAN:   Anasarca Hypoalbuminemia seems more likely than CHF but she does have G1 diastolic df  Was on IV lasix but having to back off given AKI and later hypernatremia  Echo shows G1 diastolic dysfunction  Caution w/ fluids   HCAP Meeting sepsis criteria overnight 08/22-08/24 w/ tachycardia and fever (+)procalcitonin, resolved 05/03/23  Ceftriaxone + azithromycin --> escalated to cover intraabdominal and MRSA w/ cefepime + metronidazole + vancomycin Sputum culture  DuoNeb prn  Encourage incentive spirometry!!! Consider CT abdomen low threshold if c/o increased  abd pain   Grade 1 diastolic dysfunction, preserved LVEF Suspect anasarca more from hypoalbuminemia as opposed to active  CHF exacerbation Diuresis if needed but limited d/t AKI and hypernatremia  Albumin dose repeated yesterday  Caution w/ diuresis d/t hypernatremia, Holding lasix for now and no worsening edema  Hypernatremia Likely multifactorial - fluid loss / low intake, loop diuretics At least 2L deficit  Obtain urine osm to confirm --> Relatively low UOsm 444 c/w  osmotic diuresis? --> get Glc controlled loop diuretics? --> hold Lasix if at all possible ADH resistance  Hypokalemia? --> correct potassium though hypokalemia is not severe protein malnutrition is most likely greater factor Will need to d/w dietary/pharmacy but malnutrition is going to be a persistent challenge.  Gentle IV fluids, changed to D5W - caution given HFpEF as above and also increased insulin, if increased SOB would d/c fluids and repeat CXR  Hypokalemia Substantial GI losses via NG Replace as needed, pharmacy following  Monitor BMP  Hypoxic respiratory failure  Likely d/t HCAP Also suspect restrictive component  Supplemental O2 DuoNeb prn Treat underlying causes w/ HCAP abx   AKI - resolved Await echo, concern possible cardiorenal?  Caution w/ diuresis, Holding lasix for now and no worsening edema  Hyperglycemia Most recent A1c 6.2 indicating a condition of prediabetes at baseline Acute phase of hyperglycemia likely secondary to stress and may be related to TPN. Review 24-hour insulin administration showed 7 unit of sliding scale in total however most occasions glucose still above 200. 08/22 - start Lantus 8 units daily --> have been titrating up to 17 today  Continue sliding scale every 4 hours --> increased    Coagulopathy Mild, chronic, review patient's record showed she has a chronic elevation of INR, at baseline 1.2-1.4 and per patient granddaughter, patient has no known history of easy  bleeding, no SLE history in the family.  CT abdomen showed no signs of liver cirrhosis or NASH Given the patient prolonged history of intra-abdominal issues especially involving small bowel, there is concern about vitamin K depletion due to malabsorption. 1 dose of vitamin K subcu given, recommend checking INR periodically and before surgery    Chronic iron deficiency anemia Most recent 2 readings of iron study showed severe iron depletion, again raised the concern about malabsorption. Iron  levels WNL, TIBC low, ferritin high d/t inflammation Periodic CBC                Subjective / Brief ROS:  Patient denies pain, no trouble breathing, no chest pain, no headache. Daughter is present and translates, I also speak some spanish    Family Communication: daughter at bedside on rounds     Objective Findings:  Vitals:   05/03/23 0700 05/03/23 0800 05/03/23 0900 05/03/23 1000  BP: (!) 141/71 (!) 154/64 135/62 139/74  Pulse: 94 92 88 89  Resp: (!) 27 (!) 23 (!) 26 (!) 28  Temp:  98.3 F (36.8 C)    TempSrc:  Oral    SpO2: 97% 97% 98% 100%  Weight:      Height:        Intake/Output Summary (Last 24 hours) at 05/03/2023 1359 Last data filed at 05/03/2023 1000 Gross per 24 hour  Intake 4249.66 ml  Output 3510 ml  Net 739.66 ml   Filed Weights   05/01/23 0500 05/02/23 0433 05/03/23 0405  Weight: 121.7 kg 93.9 kg 107.4 kg    Examination:  Physical Exam Constitutional:      General: She is not in acute distress.    Appearance: She is obese.  Cardiovascular:     Rate and Rhythm:  Normal rate and regular rhythm.  Pulmonary:     Effort: Pulmonary effort is normal. No respiratory distress.     Comments: Exam limited, pt unable to move much to facilitate auscultation Musculoskeletal:     Right lower leg: Edema present.     Left lower leg: Edema present.  Skin:    General: Skin is warm and dry.  Neurological:     General: No focal deficit present.     Mental Status:  She is alert.  Psychiatric:        Mood and Affect: Mood normal.        Behavior: Behavior normal.          Scheduled Medications:   sodium chloride   Intravenous Once   alteplase  2 mg Intracatheter Once   Chlorhexidine Gluconate Cloth  6 each Topical Daily   insulin aspart  0-20 Units Subcutaneous Q4H   insulin aspart  4 Units Subcutaneous Q4H   insulin glargine-yfgn  22 Units Subcutaneous Daily   lidocaine  2 patch Transdermal Q24H   pantoprazole (PROTONIX) IV  40 mg Intravenous Q24H   pregabalin  200 mg Oral TID   sodium chloride flush  10-40 mL Intracatheter Q12H    Continuous Infusions:  ceFEPime (MAXIPIME) IV 2 g (05/03/23 1231)   dextrose 75 mL/hr at 05/03/23 1000   methocarbamol (ROBAXIN) IV 500 mg (05/01/23 8119)   metronidazole Stopped (05/03/23 0508)   norepinephrine (LEVOPHED) Adult infusion Stopped (04/30/23 1901)   potassium PHOSPHATE IVPB (in mmol) 15 mmol (05/03/23 1001)   TPN ADULT (ION) 80 mL/hr at 05/03/23 1000   TPN ADULT (ION)     vancomycin      PRN Medications:  dextrose, diphenhydrAMINE **OR** diphenhydrAMINE, fluticasone, HYDROmorphone (DILAUDID) injection, iohexol, ipratropium-albuterol, ketorolac, methocarbamol **OR** methocarbamol (ROBAXIN) IV, metoprolol tartrate, ondansetron **OR** ondansetron (ZOFRAN) IV, mouth rinse, phenol, prochlorperazine, silver nitrate applicators, sodium chloride flush  Antimicrobials from admission:  Anti-infectives (From admission, onward)    Start     Dose/Rate Route Frequency Ordered Stop   05/03/23 1800  vancomycin (VANCOCIN) IVPB 1000 mg/200 mL premix  Status:  Discontinued        1,000 mg 200 mL/hr over 60 Minutes Intravenous Every 24 hours 05/02/23 1530 05/03/23 0823   05/03/23 1800  vancomycin (VANCOREADY) IVPB 1250 mg/250 mL        1,250 mg 166.7 mL/hr over 90 Minutes Intravenous Every 24 hours 05/03/23 0823     05/02/23 2000  ceFEPIme (MAXIPIME) 2 g in sodium chloride 0.9 % 100 mL IVPB        2  g 200 mL/hr over 30 Minutes Intravenous Every 8 hours 05/02/23 1530     05/02/23 1630  vancomycin (VANCOREADY) IVPB 2000 mg/400 mL        2,000 mg 200 mL/hr over 120 Minutes Intravenous  Once 05/02/23 1530 05/02/23 1746   05/02/23 1600  metroNIDAZOLE (FLAGYL) IVPB 500 mg        500 mg 100 mL/hr over 60 Minutes Intravenous Every 12 hours 05/02/23 1453     05/01/23 1400  azithromycin (ZITHROMAX) 500 mg in sodium chloride 0.9 % 250 mL IVPB  Status:  Discontinued        500 mg 250 mL/hr over 60 Minutes Intravenous Every 24 hours 05/01/23 1255 05/02/23 1453   05/01/23 1230  azithromycin (ZITHROMAX) tablet 500 mg  Status:  Discontinued        500 mg Oral Daily 05/01/23 1135 05/01/23 1255   05/01/23 1200  cefTRIAXone (ROCEPHIN) 2 g in sodium chloride 0.9 % 100 mL IVPB  Status:  Discontinued        2 g 200 mL/hr over 30 Minutes Intravenous Every 24 hours 05/01/23 1135 05/02/23 1453   04/28/23 2200  cefoTEtan (CEFOTAN) 2 g in sodium chloride 0.9 % 100 mL IVPB        2 g 200 mL/hr over 30 Minutes Intravenous Every 8 hours 04/28/23 2058 04/29/23 1717   04/27/23 0945  cefoTEtan (CEFOTAN) 2 g in sodium chloride 0.9 % 100 mL IVPB        2 g 200 mL/hr over 30 Minutes Intravenous On call to O.R. 04/27/23 0856 04/28/23 0559   04/05/23 0900  cefTRIAXone (ROCEPHIN) 2 g in sodium chloride 0.9 % 100 mL IVPB        2 g 200 mL/hr over 30 Minutes Intravenous Every 24 hours 04/05/23 0634 04/14/23 1254   12/19/22 1000  ceFAZolin (ANCEF) IVPB 2g/100 mL premix        2 g 200 mL/hr over 30 Minutes Intravenous  Once 12/18/22 1738 12/19/22 1141   09/17/22 1200  vancomycin (VANCOREADY) IVPB 1250 mg/250 mL  Status:  Discontinued        1,250 mg 166.7 mL/hr over 90 Minutes Intravenous Every 24 hours 09/16/22 1040 09/22/22 1139   09/16/22 1200  vancomycin (VANCOREADY) IVPB 1250 mg/250 mL        1,250 mg 166.7 mL/hr over 90 Minutes Intravenous  Once 09/15/22 1706 09/16/22 1255   09/15/22 1200  ceFEPIme (MAXIPIME) 2 g  in sodium chloride 0.9 % 100 mL IVPB  Status:  Discontinued        2 g 200 mL/hr over 30 Minutes Intravenous Every 8 hours 09/15/22 1024 09/18/22 1026   09/15/22 1200  vancomycin (VANCOREADY) IVPB 2000 mg/400 mL        2,000 mg 200 mL/hr over 120 Minutes Intravenous  Once 09/15/22 1024 09/15/22 1500   03/04/22 2200  piperacillin-tazobactam (ZOSYN) IVPB 3.375 g        3.375 g 12.5 mL/hr over 240 Minutes Intravenous Every 8 hours 03/04/22 2005 03/18/22 0248   02/21/22 1500  vancomycin (VANCOREADY) IVPB 1250 mg/250 mL  Status:  Discontinued        1,250 mg 166.7 mL/hr over 90 Minutes Intravenous Every 24 hours 02/21/22 1111 02/24/22 1336   02/21/22 1400  meropenem (MERREM) 1 g in sodium chloride 0.9 % 100 mL IVPB        1 g 200 mL/hr over 30 Minutes Intravenous Every 8 hours 02/21/22 1258 02/27/22 2157   02/20/22 1500  vancomycin (VANCOCIN) IVPB 1000 mg/200 mL premix  Status:  Discontinued        1,000 mg 200 mL/hr over 60 Minutes Intravenous Every 24 hours 02/19/22 1706 02/21/22 1111   02/19/22 1115  vancomycin (VANCOREADY) IVPB 2000 mg/400 mL        2,000 mg 200 mL/hr over 120 Minutes Intravenous  Once 02/19/22 1025 02/19/22 1732   02/19/22 1115  fluconazole (DIFLUCAN) IVPB 400 mg        400 mg 100 mL/hr over 120 Minutes Intravenous Every 24 hours 02/19/22 1025 03/04/22 1301   02/15/22 0730  piperacillin-tazobactam (ZOSYN) IVPB 3.375 g  Status:  Discontinued        3.375 g 12.5 mL/hr over 240 Minutes Intravenous Every 8 hours 02/15/22 0639 02/21/22 1235   02/15/22 0655  piperacillin-tazobactam (ZOSYN) 3.375 GM/50ML IVPB       Note to Pharmacy: Loretha Stapler  N: cabinet override      02/15/22 0655 02/15/22 0816   02/13/22 2200  cefoTEtan (CEFOTAN) 2 g in sodium chloride 0.9 % 100 mL IVPB  Status:  Discontinued        2 g 200 mL/hr over 30 Minutes Intravenous Every 12 hours 02/13/22 1541 02/15/22 0639   02/13/22 1345  cefoTEtan (CEFOTAN) 2 g in sodium chloride 0.9 % 100 mL IVPB         2 g 200 mL/hr over 30 Minutes Intravenous  Once 02/13/22 1331 02/13/22 2146   02/13/22 1336  sodium chloride 0.9 % with cefoTEtan (CEFOTAN) ADS Med       Note to Pharmacy: Gates Rigg: cabinet override      02/13/22 1336 02/14/22 0144   02/13/22 0620  sodium chloride 0.9 % with cefoTEtan (CEFOTAN) ADS Med       Note to Pharmacy: Agnes Lawrence A: cabinet override      02/13/22 0620 02/13/22 0802   02/13/22 0600  cefoTEtan (CEFOTAN) 2 g in sodium chloride 0.9 % 100 mL IVPB        2 g 200 mL/hr over 30 Minutes Intravenous On call to O.R. 02/12/22 2229 02/13/22 2146           Data Reviewed:  I have personally reviewed the following...  CBC: Recent Labs  Lab 04/29/23 1645 04/30/23 0401 04/30/23 0830 05/01/23 0352 05/02/23 0442 05/03/23 0407  WBC 2.6* 4.9 6.0 8.6 7.7 7.9  NEUTROABS 1.7  --   --   --  5.6 5.6  HGB 6.3* 8.4* 8.4* 8.1* 7.8* 7.8*  HCT 18.7* 24.9* 25.0* 25.3* 24.3* 23.8*  MCV 85.4 86.8 93.6 90.4 88.4 89.1  PLT 37* 52* 62* 54* 42* 46*   Basic Metabolic Panel: Recent Labs  Lab 04/27/23 0605 04/28/23 1128 04/28/23 2116 04/29/23 0346 04/30/23 0401 05/01/23 0352 05/02/23 0442 05/02/23 1541 05/03/23 0407  NA 138   < > 138 137 142 144 151* 152* 152*  K 3.7   < > 5.0 4.7 4.2 4.7 2.9* 3.1* 3.2*  CL 106   < > 110 111 110 110 115*  --  119*  CO2 28   < > 20* 22 26 26 29   --  27  GLUCOSE 185*   < > 320* 433* 275* 286* 258*  --  226*  BUN 22*   < > 27* 28* 35* 49* 39*  --  36*  CREATININE 0.54   < > 0.83 0.78 0.82 1.39* 0.90  --  0.78  CALCIUM 8.0*   < > 7.3* 7.3* 7.9* 7.8* 8.2*  --  8.3*  MG 2.1  --  1.7  --  2.6* 2.4  --   --  2.4  PHOS 2.9  --  5.8*  --  2.8  --   --   --  2.2*   < > = values in this interval not displayed.   GFR: Estimated Creatinine Clearance: 82.4 mL/min (by C-G formula based on SCr of 0.78 mg/dL). Liver Function Tests: Recent Labs  Lab 04/28/23 1128 04/28/23 2116 04/29/23 0346 05/01/23 0352 05/02/23 0442  AST 55* 75* 60*  52* 53*  ALT 33 49* 45* 49* 50*  ALKPHOS 141* 90 74 117 167*  BILITOT 1.4* 2.2* 1.1 2.2* 2.4*  PROT 5.1* 4.9* 4.4* 4.8* 4.8*  ALBUMIN 2.1* 2.7* 2.2* 2.2* 2.1*   No results for input(s): "LIPASE", "AMYLASE" in the last 168 hours. No results for input(s): "AMMONIA" in the last 168 hours.  Coagulation Profile: Recent Labs  Lab 04/28/23 2116 04/29/23 0346 04/30/23 0508 05/01/23 0352 05/03/23 0407  INR 1.5* 1.4* 1.6* 1.6* 1.5*   Cardiac Enzymes: No results for input(s): "CKTOTAL", "CKMB", "CKMBINDEX", "TROPONINI" in the last 168 hours. BNP (last 3 results) No results for input(s): "PROBNP" in the last 8760 hours. HbA1C: No results for input(s): "HGBA1C" in the last 72 hours. CBG: Recent Labs  Lab 05/02/23 1543 05/02/23 1917 05/02/23 2320 05/03/23 0747 05/03/23 1241  GLUCAP 274* 258* 198* 220* 277*   Lipid Profile: No results for input(s): "CHOL", "HDL", "LDLCALC", "TRIG", "CHOLHDL", "LDLDIRECT" in the last 72 hours. Thyroid Function Tests: No results for input(s): "TSH", "T4TOTAL", "FREET4", "T3FREE", "THYROIDAB" in the last 72 hours.  Anemia Panel: Recent Labs    05/02/23 0442  FERRITIN 878*  TIBC 140*  IRON 37   Most Recent Urinalysis On File:     Component Value Date/Time   COLORURINE YELLOW (A) 04/05/2023 0300   APPEARANCEUR HAZY (A) 04/05/2023 0300   LABSPEC 1.016 04/05/2023 0300   PHURINE 6.0 04/05/2023 0300   GLUCOSEU NEGATIVE 04/05/2023 0300   HGBUR NEGATIVE 04/05/2023 0300   BILIRUBINUR NEGATIVE 04/05/2023 0300   KETONESUR NEGATIVE 04/05/2023 0300   PROTEINUR NEGATIVE 04/05/2023 0300   NITRITE NEGATIVE 04/05/2023 0300   LEUKOCYTESUR NEGATIVE 04/05/2023 0300   Sepsis Labs: @LABRCNTIP (procalcitonin:4,lacticidven:4) Microbiology: Recent Results (from the past 240 hour(s))  MRSA Next Gen by PCR, Nasal     Status: None   Collection Time: 04/28/23  8:33 PM   Specimen: Nasal Mucosa; Nasal Swab  Result Value Ref Range Status   MRSA by PCR Next Gen NOT  DETECTED NOT DETECTED Final    Comment: (NOTE) The GeneXpert MRSA Assay (FDA approved for NASAL specimens only), is one component of a comprehensive MRSA colonization surveillance program. It is not intended to diagnose MRSA infection nor to guide or monitor treatment for MRSA infections. Test performance is not FDA approved in patients less than 25 years old. Performed at [facility], [address] [place], [place] [address]       Radiology Studies last 3 days: [facility] Chest [facility] 1 View  Result Date: 05/02/2023 CLINICAL DATA:  Pneumonia.  Four days post abdominal surgery. EXAM: PORTABLE CHEST 1 VIEW COMPARISON:  Chest yesterday FINDINGS: Enteric tube tip below the diaphragm. Unchanged right subclavian/upper extremity central line with tip overlying the atrial caval junction. Persistent low lung volumes. Prominent heart size, stable. Retrocardiac opacity likely combination of pleural fluid and atelectasis/airspace disease. Central vascular congestion again seen. No pneumothorax. IMPRESSION: 1. Persistent low lung volumes. Retrocardiac opacity likely combination of pleural fluid and atelectasis/airspace disease. 2. Unchanged cardiomegaly and central vascular congestion. Electronically Signed   By: Narda Rutherford M.D.   On: 05/02/2023 18:10   DG Chest 1 View  Result Date: 05/01/2023 CLINICAL DATA:  [address] with congestive heart failure. EXAM: CHEST  1 VIEW COMPARISON:  Portable chest yesterday at 9:16 p.m. FINDINGS: 6:15 a.m. NGT terminates in the distal gastric body. Right PICC tip is in the right atrium. Stable cardiomegaly but with increased central vascular engorgement. No significant interstitial edema. Small left pleural effusion continues to be seen with left lower lobe patchy consolidation. There is a low inspiration. Increased haziness in the perihilar areas could be due to low inspiration, pneumonia or edema. IMPRESSION: 1. Increased central vascular engorgement without  significant interstitial edema. 2. Increased haziness in the perihilar areas could be due to low inspiration, pneumonia or ground-glass edema. 3. Stable small left pleural  effusion with left lower lobe consolidation. Electronically Signed   By: Almira Bar M.D.   On: 05/01/2023 07:26   DG Chest Port 1 View  Result Date: 04/30/2023 CLINICAL DATA:  Fever EXAM: PORTABLE CHEST 1 VIEW COMPARISON:  04/30/2023 FINDINGS: Nasogastric tube extends into the upper abdomen beyond the margin examination. Right upper extremity PICC line tip is seen within the deep right atrium, unchanged from prior examination. Lung volumes are small. Opacification of the left hemithorax has improved suggesting decrease of a posterior layering pleural effusion. Retrocardiac opacification persists in keeping with atelectasis, infiltrate, or posteriorly layering pleural fluid in this location. Right lung is clear. No pneumothorax. No pleural effusion on the right. Stable cardiomegaly. IMPRESSION: 1. Improved aeration of the left hemithorax suggesting decrease of a posterior layering pleural effusion. Persistent retrocardiac opacification in keeping with atelectasis, infiltrate, or posteriorly layering pleural fluid in this location. 2. Pulmonary hypoinflation. 3. Stable cardiomegaly Electronically Signed   By: Helyn Numbers M.D.   On: 04/30/2023 23:55   DG Chest 1 View  Result Date: 04/30/2023 CLINICAL DATA:  Congestive heart failure. EXAM: CHEST  1 VIEW COMPARISON:  04/28/2023. FINDINGS: Interval removal of the endotracheal tube. Enteric tube courses below the diaphragm, beyond the field of view. Unchanged right upper extremity PICC with tip projecting over the right atrium. Increased opacity of the left hemithorax, which may represent increasing left pleural effusion and/or atelectasis or infection. No pneumothorax. IMPRESSION: Increased opacity of the left hemithorax, which may represent increasing left pleural effusion and/or  atelectasis or infection. Electronically Signed   By: Orvan Falconer M.D.   On: 04/30/2023 15:46   ECHOCARDIOGRAM COMPLETE  Result Date: 04/30/2023    ECHOCARDIOGRAM REPORT   Patient Name:   KALEE PRIOR Date of Exam: 04/30/2023 Medical Rec #:  [id number]            Height:       59.0 in Accession #:    [id number]           Weight:       268.3 lb Date of Birth:  [birth year]            BSA:          2.090 m Patient Age:    57 years             BP:           141/79 mmHg Patient Gender: F                    HR:           79 bpm. Exam Location:  [facility] Procedure: 2D Echo, Cardiac Doppler, Color Doppler and Intracardiac            Opacification Agent Indications:     CHF  History:         Patient has no prior history of Echocardiogram examinations.                  CHF. Colon CA.  Sonographer:     Mikki Harbor Referring Phys:  [id number] Emeline General Diagnosing Phys: Julien Nordmann MD  Sonographer Comments: Technically difficult study due to poor echo windows and patient is obese. IMPRESSIONS  1. Left ventricular ejection fraction, by estimation, is 60 to 65%. The left ventricle has normal function. The left ventricle has no regional wall motion abnormalities. Left ventricular diastolic parameters are consistent with Grade I diastolic dysfunction (impaired relaxation).  2. Right ventricular  systolic function is normal. The right ventricular size is normal.  3. The mitral valve is normal in structure. No evidence of mitral valve regurgitation. No evidence of mitral stenosis.  4. The aortic valve has an indeterminant number of cusps. Aortic valve regurgitation is not visualized. No aortic stenosis is present.  5. The inferior vena cava is normal in size with greater than 50% respiratory variability, suggesting right atrial pressure of 3 mmHg. FINDINGS  Left Ventricle: Left ventricular ejection fraction, by estimation, is 60 to 65%. The left ventricle has normal function. The left ventricle has no regional wall  motion abnormalities. Definity contrast agent was given IV to delineate the left ventricular  endocardial borders. The left ventricular internal cavity size was normal in size. There is no left ventricular hypertrophy. Left ventricular diastolic parameters are consistent with Grade I diastolic dysfunction (impaired relaxation). Right Ventricle: The right ventricular size is normal. No increase in right ventricular wall thickness. Right ventricular systolic function is normal. Left Atrium: Left atrial size was normal in size. Right Atrium: Right atrial size was normal in size. Pericardium: There is no evidence of pericardial effusion. Mitral Valve: The mitral valve is normal in structure. No evidence of mitral valve regurgitation. No evidence of mitral valve stenosis. MV peak gradient, 5.2 mmHg. The mean mitral valve gradient is 3.0 mmHg. Tricuspid Valve: The tricuspid valve is normal in structure. Tricuspid valve regurgitation is not demonstrated. No evidence of tricuspid stenosis. Aortic Valve: The aortic valve has an indeterminant number of cusps. Aortic valve regurgitation is not visualized. No aortic stenosis is present. Aortic valve mean gradient measures 3.0 mmHg. Aortic valve peak gradient measures 6.9 mmHg. Aortic valve area, by VTI measures 2.05 cm. Pulmonic Valve: The pulmonic valve was normal in structure. Pulmonic valve regurgitation is not visualized. No evidence of pulmonic stenosis. Aorta: The aortic root is normal in size and structure. Venous: The inferior vena cava is normal in size with greater than 50% respiratory variability, suggesting right atrial pressure of 3 mmHg. IAS/Shunts: No atrial level shunt detected by color flow Doppler.  LEFT VENTRICLE PLAX 2D LVIDd:         4.30 cm   Diastology LVIDs:         2.60 cm   LV e' medial:    8.92 cm/s LV PW:         1.00 cm   LV E/e' medial:  11.4 LV IVS:        1.10 cm   LV e' lateral:   9.14 cm/s LVOT diam:     2.00 cm   LV E/e' lateral: 11.2 LV SV:          49 LV SV Index:   23 LVOT Area:     3.14 cm  LEFT ATRIUM           Index LA diam:      3.20 cm 1.53 cm/m LA Vol (A4C): 35.0 ml 16.75 ml/m  AORTIC VALVE                    PULMONIC VALVE AV Area (Vmax):    2.30 cm     PV Vmax:       1.24 m/s AV Area (Vmean):   2.13 cm     PV Peak grad:  6.2 mmHg AV Area (VTI):     2.05 cm AV Vmax:           131.00 cm/s AV Vmean:  81.700 cm/s AV VTI:            0.237 m AV Peak Grad:      6.9 mmHg AV Mean Grad:      3.0 mmHg LVOT Vmax:         95.80 cm/s LVOT Vmean:        55.400 cm/s LVOT VTI:          0.155 m LVOT/AV VTI ratio: 0.65  AORTA Ao Root diam: 2.50 cm Ao Asc diam:  2.50 cm MITRAL VALVE MV Area (PHT): 4.96 cm     SHUNTS MV Area VTI:   1.93 cm     Systemic VTI:  0.16 m MV Peak grad:  5.2 mmHg     Systemic Diam: 2.00 cm MV Mean grad:  3.0 mmHg MV Vmax:       1.14 m/s MV Vmean:      76.4 cm/s MV Decel Time: 153 msec MV E velocity: 102.00 cm/s MV A velocity: 108.00 cm/s MV E/A ratio:  0.94 Julien Nordmann MD Electronically signed by Julien Nordmann MD Signature Date/Time: 04/30/2023/1:54:41 PM    Final              LOS: 444 days    Time spent: 50 min    Sunnie Nielsen, DO [facility] 05/03/2023, 1:59 PM    Dictation software may have been used to generate the above note. Typos may occur and escape review in typed/dictated notes. Please contact Dr Lyn Hollingshead directly for clarity if needed.  Staff may message me via secure chat in Epic  but this may not receive an immediate response,  please page me for urgent matters!  If 7PM-7AM, please contact night coverage [email or web address]

## 2023-05-03 NOTE — Plan of Care (Signed)
  Problem: Clinical Measurements: Goal: Ability to maintain clinical measurements within normal limits will improve Outcome: Not Progressing Goal: Will remain free from infection Outcome: Not Progressing Goal: Diagnostic test results will improve Outcome: Not Progressing Goal: Respiratory complications will improve Outcome: Not Progressing   Problem: Clinical Measurements: Goal: Will remain free from infection Outcome: Not Progressing   Problem: Nutrition: Goal: Adequate nutrition will be maintained Outcome: Not Progressing   Problem: Activity: Goal: Risk for activity intolerance will decrease Outcome: Not Progressing   Problem: Nutrition: Goal: Adequate nutrition will be maintained Outcome: Not Progressing   Problem: Coping: Goal: Level of anxiety will decrease Outcome: Not Progressing   Problem: Pain Managment: Goal: General experience of comfort will improve Outcome: Not Progressing   Problem: Activity: Goal: Ability to tolerate increased activity will improve Outcome: Not Progressing   Problem: Respiratory: Goal: Ability to maintain a clear airway and adequate ventilation will improve Outcome: Not Progressing   Problem: Role Relationship: Goal: Method of communication will improve Outcome: Not Progressing   Problem: Cardiac: Goal: Ability to maintain an adequate cardiac output will improve Outcome: Not Progressing   Problem: Health Behavior/Discharge Planning: Goal: Ability to identify and utilize available resources and services will improve Outcome: Not Progressing Goal: Ability to manage health-related needs will improve Outcome: Not Progressing   Problem: Fluid Volume: Goal: Ability to achieve a balanced intake and output will improve Outcome: Not Progressing   Problem: Metabolic: Goal: Ability to maintain appropriate glucose levels will improve Outcome: Not Progressing   Problem: Nutritional: Goal: Maintenance of adequate nutrition will  improve Outcome: Not Progressing Goal: Maintenance of adequate weight for body size and type will improve Outcome: Not Progressing   Problem: Respiratory: Goal: Will regain and/or maintain adequate ventilation Outcome: Not Progressing   Problem: Urinary Elimination: Goal: Ability to achieve and maintain adequate renal perfusion and functioning will improve Outcome: Not Progressing

## 2023-05-03 NOTE — Progress Notes (Signed)
0800 patient alert to self unable to answer other questions states she known's the answer but unable to answer them daughter at bedside helps translate also

## 2023-05-03 NOTE — Progress Notes (Signed)
       CROSS COVER NOTE  NAME: Debra [name] MRN: [id number] DOB : 1966/01/14    Concern as stated by nurse / staff   electrolytes     Pertinent findings on chart review:   Assessment and  Interventions   Assessment: K 3.2, NA 152, no change in 24 hours with LR at 75 Plan: Change IV fluids to D5W  Continue q 4h sodium checks 6 runs KCL 10 meq      Donnie Mesa NP [facility] Cross Cover 7pm-7am - check amion for availability Pager [phone]

## 2023-05-03 NOTE — Plan of Care (Signed)
Patient is on Cardiac PCU at time of writing. TPN via PICC. NGT to LIWS due to on-going nausea. NPWT to abdominal incision with high output. JP x 2 to abdomen.  Edit 0330 hrs: Patient transferred to SDU overnight due to decreased LOC, tachycardia, fever, and increasing NPWT output; concern for new/worsening sepsis. Nursing Handoff given to Crisoforo Oxford, RN.   Problem: Clinical Measurements: Goal: Ability to maintain clinical measurements within normal limits will improve Outcome: Not Progressing Goal: Will remain free from infection Outcome: Not Progressing Goal: Diagnostic test results will improve Outcome: Not Progressing Goal: Respiratory complications will improve Outcome: Not Progressing   Problem: Clinical Measurements: Goal: Will remain free from infection Outcome: Not Progressing   Problem: Nutrition: Goal: Adequate nutrition will be maintained Outcome: Not Progressing   Problem: Activity: Goal: Risk for activity intolerance will decrease Outcome: Not Progressing   Problem: Nutrition: Goal: Adequate nutrition will be maintained Outcome: Not Progressing   Problem: Coping: Goal: Level of anxiety will decrease Outcome: Not Progressing   Problem: Pain Managment: Goal: General experience of comfort will improve Outcome: Not Progressing   Problem: Activity: Goal: Ability to tolerate increased activity will improve Outcome: Not Progressing   Problem: Respiratory: Goal: Ability to maintain a clear airway and adequate ventilation will improve Outcome: Not Progressing   Problem: Role Relationship: Goal: Method of communication will improve Outcome: Not Progressing   Problem: Education: Goal: Ability to describe self-care measures that may prevent or decrease complications (Diabetes Survival Skills Education) will improve Outcome: Not Progressing   Problem: Cardiac: Goal: Ability to maintain an adequate cardiac output will improve Outcome: Not Progressing    Problem: Health Behavior/Discharge Planning: Goal: Ability to identify and utilize available resources and services will improve Outcome: Not Progressing Goal: Ability to manage health-related needs will improve Outcome: Not Progressing   Problem: Fluid Volume: Goal: Ability to achieve a balanced intake and output will improve Outcome: Not Progressing   Problem: Metabolic: Goal: Ability to maintain appropriate glucose levels will improve Outcome: Not Progressing   Problem: Nutritional: Goal: Maintenance of adequate nutrition will improve Outcome: Not Progressing Goal: Maintenance of adequate weight for body size and type will improve Outcome: Not Progressing   Problem: Respiratory: Goal: Will regain and/or maintain adequate ventilation Outcome: Not Progressing   Problem: Urinary Elimination: Goal: Ability to achieve and maintain adequate renal perfusion and functioning will improve Outcome: Not Progressing

## 2023-05-03 NOTE — Consult Note (Signed)
Pharmacy Antibiotic Note  Sumeya Adalyna Dimercurio is a 57 y.o. female admitted on 02/13/2022 with  pneumonia and possible intra-abdominal infection .  Pharmacy has been consulted for cefepime and vancomycin dosing.  Plan: Scr improved 0.90 >> 0.78 -Will adjust Vancomycin to 1250 mg IV q24h Calculated AUC 492 Calculated Cmin 11.8 Scr used 0.80 Vd coefficient 0.50 (BMI > 30)  -continue Cefepime 2gm q 8hrs  *Patient also on metronidazole 500mg  IV q 12hrs per MD orders*  F/u renal fxn, cultures  Height: 4\' 11"  (149.9 cm) Weight: 107.4 kg (236 lb 12.4 oz) IBW/kg (Calculated) : 43.2  Temp (24hrs), Avg:99 F (37.2 C), Min:98.1 F (36.7 C), Max:101.1 F (38.4 C)  Recent Labs  Lab 04/27/23 2345 04/28/23 1128 04/28/23 2116 04/29/23 0346 04/29/23 1120 04/29/23 1645 04/30/23 0401 04/30/23 0830 05/01/23 0352 05/02/23 0442 05/03/23 0407  WBC  --    < > 12.8* 9.0  --  2.6* 4.9 6.0 8.6 7.7 7.9  CREATININE  --    < > 0.83 0.78  --   --  0.82  --  1.39* 0.90 0.78  LATICACIDVEN 3.5*  --  3.6* 3.4* 4.4* 1.3  --   --   --   --   --    < > = values in this interval not displayed.    Estimated Creatinine Clearance: 82.4 mL/min (by C-G formula based on SCr of 0.78 mg/dL).    No Known Allergies  Antimicrobials this admission: 8/23 Azithromycin >> 8/24 8/23 Ceftriaxone >> 8/24 8/24 Cefepime >> 8/24 Vancomycin >>  Dose adjustments this admission:  8/25  vanc 100mg  q24h to 1250 mg q24h  Microbiology results: Sputum cx: pending   Thank you for allowing pharmacy to be a part of this patient's care.  Bari Mantis PharmD Clinical Pharmacist 05/03/2023

## 2023-05-04 ENCOUNTER — Encounter: Payer: Self-pay | Admitting: Surgery

## 2023-05-04 ENCOUNTER — Inpatient Hospital Stay: Payer: Medicaid Other

## 2023-05-04 DIAGNOSIS — J810 Acute pulmonary edema: Secondary | ICD-10-CM

## 2023-05-04 DIAGNOSIS — J9601 Acute respiratory failure with hypoxia: Secondary | ICD-10-CM

## 2023-05-04 DIAGNOSIS — G9341 Metabolic encephalopathy: Secondary | ICD-10-CM

## 2023-05-04 DIAGNOSIS — J189 Pneumonia, unspecified organism: Secondary | ICD-10-CM

## 2023-05-04 LAB — BLOOD GAS, VENOUS
Acid-base deficit: 0.4 mmol/L (ref 0.0–2.0)
Bicarbonate: 25.9 mmol/L (ref 20.0–28.0)
O2 Saturation: 83.6 %
Patient temperature: 37
pCO2, Ven: 48 mmHg (ref 44–60)
pH, Ven: 7.34 (ref 7.25–7.43)
pO2, Ven: 51 mmHg — ABNORMAL HIGH (ref 32–45)

## 2023-05-04 LAB — GLUCOSE, CAPILLARY
Glucose-Capillary: 114 mg/dL — ABNORMAL HIGH (ref 70–99)
Glucose-Capillary: 134 mg/dL — ABNORMAL HIGH (ref 70–99)
Glucose-Capillary: 136 mg/dL — ABNORMAL HIGH (ref 70–99)
Glucose-Capillary: 149 mg/dL — ABNORMAL HIGH (ref 70–99)
Glucose-Capillary: 204 mg/dL — ABNORMAL HIGH (ref 70–99)
Glucose-Capillary: 238 mg/dL — ABNORMAL HIGH (ref 70–99)
Glucose-Capillary: 243 mg/dL — ABNORMAL HIGH (ref 70–99)

## 2023-05-04 LAB — COMPREHENSIVE METABOLIC PANEL
ALT: 75 U/L — ABNORMAL HIGH (ref 0–44)
AST: 79 U/L — ABNORMAL HIGH (ref 15–41)
Albumin: 2.1 g/dL — ABNORMAL LOW (ref 3.5–5.0)
Alkaline Phosphatase: 194 U/L — ABNORMAL HIGH (ref 38–126)
Anion gap: 7 (ref 5–15)
BUN: 44 mg/dL — ABNORMAL HIGH (ref 6–20)
CO2: 24 mmol/L (ref 22–32)
Calcium: 7.9 mg/dL — ABNORMAL LOW (ref 8.9–10.3)
Chloride: 118 mmol/L — ABNORMAL HIGH (ref 98–111)
Creatinine, Ser: 1.05 mg/dL — ABNORMAL HIGH (ref 0.44–1.00)
GFR, Estimated: >60 mL/min (ref >60)
Glucose, Bld: 121 mg/dL — ABNORMAL HIGH (ref 70–99)
Potassium: 4.6 mmol/L (ref 3.5–5.1)
Sodium: 149 mmol/L — ABNORMAL HIGH (ref 135–145)
Total Bilirubin: 3.2 mg/dL — ABNORMAL HIGH (ref 0.3–1.2)
Total Protein: 4.9 g/dL — ABNORMAL LOW (ref 6.5–8.1)

## 2023-05-04 LAB — CBC WITH DIFFERENTIAL/PLATELET
Abs Immature Granulocytes: 0.93 10*3/uL — ABNORMAL HIGH (ref 0.00–0.07)
Basophils Absolute: 0.1 10*3/uL (ref 0.0–0.1)
Basophils Relative: 1 %
Eosinophils Absolute: 0.1 10*3/uL (ref 0.0–0.5)
Eosinophils Relative: 1 %
HCT: 26.2 % — ABNORMAL LOW (ref 36.0–46.0)
Hemoglobin: 8.2 g/dL — ABNORMAL LOW (ref 12.0–15.0)
Immature Granulocytes: 8 %
Lymphocytes Relative: 7 %
Lymphs Abs: 0.8 10*3/uL (ref 0.7–4.0)
MCH: 29.2 pg (ref 26.0–34.0)
MCHC: 31.3 g/dL (ref 30.0–36.0)
MCV: 93.2 fL (ref 80.0–100.0)
Monocytes Absolute: 0.9 10*3/uL (ref 0.1–1.0)
Monocytes Relative: 8 %
Neutro Abs: 8.3 10*3/uL — ABNORMAL HIGH (ref 1.7–7.7)
Neutrophils Relative %: 75 %
Platelets: 46 10*3/uL — ABNORMAL LOW (ref 150–400)
RBC: 2.81 MIL/uL — ABNORMAL LOW (ref 3.87–5.11)
RDW: 22.4 % — ABNORMAL HIGH (ref 11.5–15.5)
WBC: 11.2 10*3/uL — ABNORMAL HIGH (ref 4.0–10.5)
nRBC: 2.8 % — ABNORMAL HIGH (ref 0.0–0.2)

## 2023-05-04 LAB — SODIUM
Sodium: 150 mmol/L — ABNORMAL HIGH (ref 135–145)
Sodium: 150 mmol/L — ABNORMAL HIGH (ref 135–145)

## 2023-05-04 LAB — PHOSPHORUS: Phosphorus: 4.3 mg/dL (ref 2.5–4.6)

## 2023-05-04 LAB — D-DIMER, QUANTITATIVE: D-Dimer, Quant: 2.95 ug{FEU}/mL — ABNORMAL HIGH (ref 0.00–0.50)

## 2023-05-04 LAB — MAGNESIUM: Magnesium: 2.2 mg/dL (ref 1.7–2.4)

## 2023-05-04 LAB — TRIGLYCERIDES: Triglycerides: 147 mg/dL (ref <150)

## 2023-05-04 MED ORDER — SODIUM CHLORIDE 0.45 % IV SOLN: INTRAVENOUS | Status: DC

## 2023-05-04 MED ORDER — ACETAMINOPHEN 325 MG PO TABS: 650.0000 mg | ORAL_TABLET | Freq: Once | ORAL | Status: AC

## 2023-05-04 MED ORDER — ACETAMINOPHEN 10 MG/ML IV SOLN
1000.0000 mg | Freq: Four times a day (QID) | INTRAVENOUS | Status: AC
  Administered 2023-05-04 – 2023-05-05 (×4): 1000 mg via INTRAVENOUS
  Filled 2023-05-04 (×4): qty 100

## 2023-05-04 MED ORDER — FENTANYL CITRATE PF 50 MCG/ML IJ SOSY
50.0000 ug | PREFILLED_SYRINGE | Freq: Once | INTRAMUSCULAR | Status: AC
  Administered 2023-05-04: 50 ug via INTRAVENOUS
  Filled 2023-05-04: qty 1

## 2023-05-04 MED ORDER — SODIUM CHLORIDE 0.9 % IV BOLUS
250.0000 mL | Freq: Once | INTRAVENOUS | Status: AC
  Administered 2023-05-04: 250 mL via INTRAVENOUS

## 2023-05-04 MED ORDER — ZINC CHLORIDE 1 MG/ML IV SOLN
INTRAVENOUS | Status: AC
  Filled 2023-05-04: qty 707.2

## 2023-05-04 MED ORDER — FUROSEMIDE 10 MG/ML IJ SOLN
40.0000 mg | Freq: Once | INTRAMUSCULAR | Status: AC
  Administered 2023-05-04: 40 mg via INTRAVENOUS
  Filled 2023-05-04: qty 4

## 2023-05-04 MED ORDER — IOHEXOL 350 MG/ML SOLN
75.0000 mL | Freq: Once | INTRAVENOUS | Status: AC | PRN
  Administered 2023-05-04: 75 mL via INTRAVENOUS

## 2023-05-04 MED ORDER — OCTREOTIDE ACETATE 100 MCG/ML IJ SOLN
100.0000 ug | Freq: Three times a day (TID) | INTRAMUSCULAR | Status: DC
  Administered 2023-05-04 – 2023-05-12 (×24): 100 ug via SUBCUTANEOUS
  Filled 2023-05-04 (×25): qty 1

## 2023-05-04 MED ORDER — ALTEPLASE 2 MG IJ SOLR
INTRAMUSCULAR | Status: AC
  Filled 2023-05-04: qty 2

## 2023-05-04 MED ORDER — PIPERACILLIN-TAZOBACTAM 3.375 G IVPB
3.3750 g | Freq: Three times a day (TID) | INTRAVENOUS | Status: AC
  Administered 2023-05-04 – 2023-05-07 (×12): 3.375 g via INTRAVENOUS
  Filled 2023-05-04 (×12): qty 50

## 2023-05-04 MED ORDER — ACETAMINOPHEN 325 MG PO TABS
ORAL_TABLET | ORAL | Status: AC
  Administered 2023-05-04: 650 mg via ORAL
  Filled 2023-05-04: qty 2

## 2023-05-04 MED ORDER — VANCOMYCIN HCL IN DEXTROSE 1-5 GM/200ML-% IV SOLN
1000.0000 mg | INTRAVENOUS | Status: DC
  Administered 2023-05-04: 1000 mg via INTRAVENOUS
  Filled 2023-05-04: qty 200

## 2023-05-04 MED ORDER — HEPARIN SODIUM (PORCINE) 5000 UNIT/ML IJ SOLN
5000.0000 [IU] | Freq: Three times a day (TID) | INTRAMUSCULAR | Status: DC
  Administered 2023-05-04 – 2023-05-16 (×31): 5000 [IU] via SUBCUTANEOUS
  Filled 2023-05-04 (×32): qty 1

## 2023-05-04 MED ORDER — FUROSEMIDE 10 MG/ML IJ SOLN
20.0000 mg | Freq: Once | INTRAMUSCULAR | Status: AC
  Administered 2023-05-04: 20 mg via INTRAVENOUS
  Filled 2023-05-04: qty 2

## 2023-05-04 NOTE — Progress Notes (Signed)
Plan of care communicated to the patient via [employer] video but patient didn't answer any question. Will call her daughter to the room and try again. Spoke to interpreter [place] with [phone]

## 2023-05-04 NOTE — Progress Notes (Signed)
CC: POD  6 Subjective: Confused and tachy, some low grade temp CT pers reviewed no abscess or obvious bowel injuries Drains dark serous fluid w fat lobules, not bilious Still making good UO BP ok Tachy now resolved  Objective: Vital signs in last 24 hours: Temp:  [98.1 F (36.7 C)-101.1 F (38.4 C)] 99.8 F (37.7 C) (08/26 0800) Pulse Rate:  [82-117] 82 (08/26 1200) Resp:  [11-21] 15 (08/26 1200) BP: (91-146)/(46-85) 104/52 (08/26 1200) SpO2:  [96 %-100 %] 99 % (08/26 1200) Last BM Date : 05/03/23  Intake/Output from previous day: 08/25 0701 - 08/26 0700 In: 5130.6 [I.V.:3295.9; NG/GT:120; IV Piggyback:1714.7] Out: 4260 [Urine:1795; Emesis/NG output:340; Drains:2125] Intake/Output this shift: Total I/O In: 762.2 [I.V.:583.4; NG/GT:50; IV Piggyback:128.8] Out: 2625 [Urine:1550; Drains:1075]  Physical exam:  Awake she is confused Abd: vac in place Drains dark serous fluid w fat lobules, not bilious, some potential area of skin ischemia not surprising given the extent and the flaps, no evidence of necrotizing infection  Lab Results: CBC  Recent Labs    05/03/23 0407 05/04/23 0325  WBC 7.9 11.2*  HGB 7.8* 8.2*  HCT 23.8* 26.2*  PLT 46* 46*   BMET Recent Labs    05/03/23 2154 05/04/23 0325 05/04/23 1046  NA 149* 149*  150* 150*  K 4.1 4.6  --   CL 118* 118*  --   CO2 25 24  --   GLUCOSE 180* 121*  --   BUN 37* 44*  --   CREATININE 0.90 1.05*  --   CALCIUM 8.0* 7.9*  --    PT/INR Recent Labs    05/03/23 0407 05/03/23 2154  LABPROT 18.1* 18.5*  INR 1.5* 1.5*   ABG Recent Labs    05/04/23 0430  HCO3 25.9    Studies/Results: CT ABDOMEN PELVIS WO CONTRAST  Result Date: 05/04/2023 CLINICAL DATA:  Sepsis, initial encounter EXAM: CT ABDOMEN AND PELVIS WITHOUT CONTRAST TECHNIQUE: Multidetector CT imaging of the abdomen and pelvis was performed following the standard protocol without IV contrast. RADIATION DOSE REDUCTION: This exam was performed  according to the departmental dose-optimization program which includes automated exposure control, adjustment of the mA and/or kV according to patient size and/or use of iterative reconstruction technique. COMPARISON:  04/01/2023 FINDINGS: Lower chest: Left pleural effusion is again identified with left basilar consolidation. Patchy infiltrates are noted throughout both lungs consistent with multifocal pneumonia. Hepatobiliary: No focal liver abnormality is seen. No gallstones, gallbladder wall thickening, or biliary dilatation. Pancreas: Unremarkable. No pancreatic ductal dilatation or surrounding inflammatory changes. Spleen: Normal in size without focal abnormality. Adrenals/Urinary Tract: Adrenal glands are within normal limits. Kidneys are well visualize without renal calculi or obstructive change. No focal mass is seen. The bladder is decompressed by Foley catheter. Stomach/Bowel: Gastric catheter is noted within the stomach. Stomach is decompressed. No obstructive changes of the small bowel are seen. Postsurgical changes are noted within the small bowel anterior consistent with the given clinical history. Surgical drains are seen. Air is noted in the anterior abdominal wall as well as within the peritoneal cavity consistent with the recent surgical history. Wide granulating wound is noted anteriorly. No obstructive changes are seen. No findings to suggest postoperative abscess are noted. Vascular/Lymphatic: No significant vascular findings are present. No enlarged abdominal or pelvic lymph nodes. Reproductive: Uterus and bilateral adnexa are unremarkable. Other: No abdominal wall hernia or abnormality. No abdominopelvic ascites. Musculoskeletal: No acute or significant osseous findings. IMPRESSION: Postoperative changes are noted with wide anterior abdominal wound and  changes consistent with prior small bowel resection. Scattered foci of air are noted within the abdomen as well as within the anterior  abdominal wall consistent with the recent surgical history. No discrete abscess is seen. Left effusion stable from the prior exam. Patchy airspace opacities throughout both lungs consistent with multifocal infiltrate. Electronically Signed   By: Alcide Clever M.D.   On: 05/04/2023 03:35   DG Chest 1 View  Result Date: 05/04/2023 CLINICAL DATA:  [id number] with ongoing fever. EXAM: CHEST  1 VIEW COMPARISON:  Portable chest 05/02/2023 3:01 p.m. FINDINGS: 24:28 a.m. NGT enters the stomach with the intragastric course not filmed. Right PICC tip remains at the superior cavoatrial junction. Stable moderate cardiomegaly. There is central vascular prominence, today slightly improved. There is still interstitial consolidation in the lung fields but this is also mildly improved. Patchy hazy opacities in the right lung fields, left mid and lower lung are again noted and may suggest ground-glass edema, pneumonitis or combination. Small left pleural effusion appears similar. In all other respects no further changes. IMPRESSION: 1. Slightly improved central vascular prominence and mild improvement in interstitial consolidation/edema. 2. Patchy hazy opacities in the right lung fields, left mid and lower lung are again noted and may suggest ground-glass edema, pneumonitis or combination. 3. Similar small left pleural effusion. Electronically Signed   By: Almira Bar M.D.   On: 05/04/2023 03:19   CT HEAD WO CONTRAST ( )  Result Date: 05/04/2023 CLINICAL DATA:  Mental status change, unknown cause EXAM: CT HEAD WITHOUT CONTRAST TECHNIQUE: Contiguous axial images were obtained from the base of the skull through the vertex without intravenous contrast. RADIATION DOSE REDUCTION: This exam was performed according to the departmental dose-optimization program which includes automated exposure control, adjustment of the mA and/or kV according to patient size and/or use of iterative reconstruction technique. COMPARISON:  07/14/2022  FINDINGS: Brain: No evidence of acute infarction, hemorrhage, mass, mass effect, or midline shift. No hydrocephalus or extra-axial fluid collection. Vascular: No hyperdense vessel. Skull: Negative for fracture or focal lesion. Sinuses/Orbits: Air-fluid level in the right sphenoid sinus. A nasogastric to is noted in the nasal cavity. No acute finding in the orbits. Other: Fluid in the bilateral mastoid air cells. IMPRESSION: No acute intracranial process. Electronically Signed   By: Wiliam Ke M.D.   On: 05/04/2023 03:16   DG Chest Port 1 View  Result Date: 05/02/2023 CLINICAL DATA:  Pneumonia.  Four days post abdominal surgery. EXAM: PORTABLE CHEST 1 VIEW COMPARISON:  Chest yesterday FINDINGS: Enteric tube tip below the diaphragm. Unchanged right subclavian/upper extremity central line with tip overlying the atrial caval junction. Persistent low lung volumes. Prominent heart size, stable. Retrocardiac opacity likely combination of pleural fluid and atelectasis/airspace disease. Central vascular congestion again seen. No pneumothorax. IMPRESSION: 1. Persistent low lung volumes. Retrocardiac opacity likely combination of pleural fluid and atelectasis/airspace disease. 2. Unchanged cardiomegaly and central vascular congestion. Electronically Signed   By: Narda Rutherford M.D.   On: 05/02/2023 18:10    Anti-infectives: Anti-infectives (From admission, onward)    Start     Dose/Rate Route Frequency Ordered Stop   05/04/23 1800  vancomycin (VANCOCIN) IVPB 1000 mg/200 mL premix        1,000 mg 200 mL/hr over 60 Minutes Intravenous Every 24 hours 05/04/23 0755     05/04/23 0600  piperacillin-tazobactam (ZOSYN) IVPB 3.375 g        3.375 g 12.5 mL/hr over 240 Minutes Intravenous Every 8 hours 05/04/23 0236  05/03/23 1800  vancomycin (VANCOCIN) IVPB 1000 mg/200 mL premix  Status:  Discontinued        1,000 mg 200 mL/hr over 60 Minutes Intravenous Every 24 hours 05/02/23 1530 05/03/23 0823   05/03/23  1800  vancomycin (VANCOREADY) IVPB 1250 mg/250 mL  Status:  Discontinued        1,250 mg 166.7 mL/hr over 90 Minutes Intravenous Every 24 hours 05/03/23 0823 05/03/23 1752   05/03/23 1800  Vancomycin (VANCOCIN) 1,250 mg in sodium chloride 0.9 % 250 mL IVPB  Status:  Discontinued        1,250 mg 166.7 mL/hr over 90 Minutes Intravenous Every 24 hours 05/03/23 1752 05/04/23 0755   05/02/23 2000  ceFEPIme (MAXIPIME) 2 g in sodium chloride 0.9 % 100 mL IVPB  Status:  Discontinued        2 g 200 mL/hr over 30 Minutes Intravenous Every 8 hours 05/02/23 1530 05/04/23 0236   05/02/23 1630  vancomycin (VANCOREADY) IVPB 2000 mg/400 mL        2,000 mg 200 mL/hr over 120 Minutes Intravenous  Once 05/02/23 1530 05/02/23 1746   05/02/23 1600  metroNIDAZOLE (FLAGYL) IVPB 500 mg  Status:  Discontinued        500 mg 100 mL/hr over 60 Minutes Intravenous Every 12 hours 05/02/23 1453 05/04/23 0236   05/01/23 1400  azithromycin (ZITHROMAX) 500 mg in sodium chloride 0.9 % 250 mL IVPB  Status:  Discontinued        500 mg 250 mL/hr over 60 Minutes Intravenous Every 24 hours 05/01/23 1255 05/02/23 1453   05/01/23 1230  azithromycin (ZITHROMAX) tablet 500 mg  Status:  Discontinued        500 mg Oral Daily 05/01/23 1135 05/01/23 1255   05/01/23 1200  cefTRIAXone (ROCEPHIN) 2 g in sodium chloride 0.9 % 100 mL IVPB  Status:  Discontinued        2 g 200 mL/hr over 30 Minutes Intravenous Every 24 hours 05/01/23 1135 05/02/23 1453   04/28/23 2200  cefoTEtan (CEFOTAN) 2 g in sodium chloride 0.9 % 100 mL IVPB        2 g 200 mL/hr over 30 Minutes Intravenous Every 8 hours 04/28/23 2058 04/29/23 1717   04/27/23 0945  cefoTEtan (CEFOTAN) 2 g in sodium chloride 0.9 % 100 mL IVPB        2 g 200 mL/hr over 30 Minutes Intravenous On call to O.R. 04/27/23 0856 04/28/23 0559   04/05/23 0900  cefTRIAXone (ROCEPHIN) 2 g in sodium chloride 0.9 % 100 mL IVPB        2 g 200 mL/hr over 30 Minutes Intravenous Every 24 hours 04/05/23  0634 04/14/23 1254   12/19/22 1000  ceFAZolin (ANCEF) IVPB 2g/100 mL premix        2 g 200 mL/hr over 30 Minutes Intravenous  Once 12/18/22 1738 12/19/22 1141   09/17/22 1200  vancomycin (VANCOREADY) IVPB 1250 mg/250 mL  Status:  Discontinued        1,250 mg 166.7 mL/hr over 90 Minutes Intravenous Every 24 hours 09/16/22 1040 09/22/22 1139   09/16/22 1200  vancomycin (VANCOREADY) IVPB 1250 mg/250 mL        1,250 mg 166.7 mL/hr over 90 Minutes Intravenous  Once 09/15/22 1706 09/16/22 1255   09/15/22 1200  ceFEPIme (MAXIPIME) 2 g in sodium chloride 0.9 % 100 mL IVPB  Status:  Discontinued        2 g 200 mL/hr over 30 Minutes Intravenous Every  8 hours 09/15/22 1024 09/18/22 1026   09/15/22 1200  vancomycin (VANCOREADY) IVPB 2000 mg/400 mL        2,000 mg 200 mL/hr over 120 Minutes Intravenous  Once 09/15/22 1024 09/15/22 1500   03/04/22 2200  piperacillin-tazobactam (ZOSYN) IVPB 3.375 g        3.375 g 12.5 mL/hr over 240 Minutes Intravenous Every 8 hours 03/04/22 2005 03/18/22 0248   02/21/22 1500  vancomycin (VANCOREADY) IVPB 1250 mg/250 mL  Status:  Discontinued        1,250 mg 166.7 mL/hr over 90 Minutes Intravenous Every 24 hours 02/21/22 1111 02/24/22 1336   02/21/22 1400  meropenem (MERREM) 1 g in sodium chloride 0.9 % 100 mL IVPB        1 g 200 mL/hr over 30 Minutes Intravenous Every 8 hours 02/21/22 1258 02/27/22 2157   02/20/22 1500  vancomycin (VANCOCIN) IVPB 1000 mg/200 mL premix  Status:  Discontinued        1,000 mg 200 mL/hr over 60 Minutes Intravenous Every 24 hours 02/19/22 1706 02/21/22 1111   02/19/22 1115  vancomycin (VANCOREADY) IVPB 2000 mg/400 mL        2,000 mg 200 mL/hr over 120 Minutes Intravenous  Once 02/19/22 1025 02/19/22 1732   02/19/22 1115  fluconazole (DIFLUCAN) IVPB 400 mg        400 mg 100 mL/hr over 120 Minutes Intravenous Every 24 hours 02/19/22 1025 03/04/22 1301   02/15/22 0730  piperacillin-tazobactam (ZOSYN) IVPB 3.375 g  Status:  Discontinued         3.375 g 12.5 mL/hr over 240 Minutes Intravenous Every 8 hours 02/15/22 0639 02/21/22 1235   02/15/22 0655  piperacillin-tazobactam (ZOSYN) 3.375 GM/50ML IVPB       Note to Pharmacy: Loretha Stapler N: cabinet override      02/15/22 0655 02/15/22 0816   02/13/22 2200  cefoTEtan (CEFOTAN) 2 g in sodium chloride 0.9 % 100 mL IVPB  Status:  Discontinued        2 g 200 mL/hr over 30 Minutes Intravenous Every 12 hours 02/13/22 1541 02/15/22 0639   02/13/22 1345  cefoTEtan (CEFOTAN) 2 g in sodium chloride 0.9 % 100 mL IVPB        2 g 200 mL/hr over 30 Minutes Intravenous  Once 02/13/22 1331 02/13/22 2146   02/13/22 1336  sodium chloride 0.9 % with cefoTEtan (CEFOTAN) ADS Med       Note to Pharmacy: Gates Rigg: cabinet override      02/13/22 1336 02/14/22 0144   02/13/22 0620  sodium chloride 0.9 % with cefoTEtan (CEFOTAN) ADS Med       Note to Pharmacy: Agnes Lawrence A: cabinet override      02/13/22 0620 02/13/22 0802   02/13/22 0600  cefoTEtan (CEFOTAN) 2 g in sodium chloride 0.9 % 100 mL IVPB        2 g 200 mL/hr over 30 Minutes Intravenous On call to O.R. 02/12/22 2229 02/13/22 2146       Assessment/Plan:  Agree w broad a/bs Pending CT chest r/o pe Need NGT and hold enteral nutrition in case she is developing a fistula Continue tpn and supportive care Appreciate hospitalist assistance   Sterling Big, MD, [facility]  05/04/2023

## 2023-05-04 NOTE — Progress Notes (Signed)
   05/03/23 2352  Assess: MEWS Score  Temp (!) 100.9 F (38.3 C)  BP 105/76  MAP (mmHg) 84  Pulse Rate (!) 104  ECG Heart Rate (!) 104  Resp 16  Level of Consciousness Responds to Voice  SpO2 97 %  O2 Device HFNC  Patient Activity (if Appropriate) In bed  O2 Flow Rate (L/min) 4 L/min  Assess: MEWS Score  MEWS Temp 1  MEWS Systolic 0  MEWS Pulse 1  MEWS RR 0  MEWS LOC 1  MEWS Score 3  MEWS Score Color Yellow  Assess: if the MEWS score is Yellow or Red  Were vital signs accurate and taken at a resting state? Yes  Does the patient meet 2 or more of the SIRS criteria? No  Does the patient have a confirmed or suspected source of infection? Yes  MEWS guidelines implemented  No, previously yellow, continue vital signs every 4 hours  Treat  MEWS Interventions Administered scheduled meds/treatments  Escalate  MEWS: Escalate Yellow: discuss with charge nurse/RN and consider discussing with provider and RRT  Notify: Charge Nurse/RN  Name of Charge Nurse/RN Notified Noreene Larsson,  Date Charge Nurse/RN Notified 05/04/23  Time Charge Nurse/RN Notified 0044  Provider Notification  Provider Name/Title Dr. Arville Care  Date Provider Notified 05/03/23  Time Provider Notified 1954  Method of Notification Page  Notification Reason Other (Comment)  Provider response No new orders  Date of Provider Response 05/04/23  Time of Provider Response 1955  Assess: SIRS CRITERIA  SIRS Temperature  0  SIRS Pulse 1  SIRS Respirations  0  SIRS WBC 0  SIRS Score Sum  1

## 2023-05-04 NOTE — Progress Notes (Signed)
Nutrition Follow-up  DOCUMENTATION CODES:   Obesity unspecified  INTERVENTION:   Recommend initiation of enteral nutrition via NGT once medically appropriate   Continue TPN per pharmacy (24hr)- provides 2192kcal/day and 109g/day protein   Add zinc additional 10mg  daily in TPN   Resume ergocalciferol 50,000 units po weekly once appropriate   Daily weights   Vitamin A and selenium labs pending   NUTRITION DIAGNOSIS:   Increased nutrient needs related to wound healing, catabolic illness as evidenced by estimated needs. -ongoing   GOAL:   Patient will meet greater than or equal to 90% of their needs -met with TPN  MONITOR:   Diet advancement, Labs, Weight trends, Skin, I & O's, Other (Comment) (TPN)  ASSESSMENT:   57 y/o female with h/o hypothyroidism, COVID 19 (3/21), kidney stones and stage 3 colon cancer (adenocarcinoma s/p left hemicolectomy 01/26/22 and chemotherapy) who was admitted for new pelvic mass s/p laparotomy 02/13/22 (with excision of pelvic mass from greater omentum, abdominal wall reconstruction with bilateral myocutaneous flaps and mesh, incisional hernia repair, appendectomy repair and VAC placement) complicated by bowel perforation s/p reopening of recent laparotomy 02/15/22 (with repair of small bowel perforation, excision of mesh, placement of two phasix mesh and VAC placement) complicated by EC fistula requiring chronic TPN, new L1 compression fracture and grade 1 diastolic dysfunction with preserved LVEF and now s/p laparotomy 04/28/23 (with abdominal wall reconstruction with bilateral myocutaneous flaps, recurrent incisional hernia repair, partial distal  gastrectomy, small bowel resection (~80 cm of distal jejunum including three EC fistulas), extensive LOA, repair of enterotomies x 3, VAC placement, excisional debridement of abdominal wall skin and excision of old mesh) complicated by HCAP, AKI, volume overload and sepsis.   Pt continues to have some confusion.  Pt tolerating TPN well at goal rate. Refeed labs stable. Triglycerides wnl. Vitamin labs being monitored regularly and are repleted as needed. Pt continues to have hyperglycemia; insulin added in TPN. Pt remains NPO. OGT in place with minimal output. Pt is having bowel function. Per chart, pt remains up >25lbs from her UBW. Pt with widespread anasarca. Would recommend placement of small bore NGT and nutrition support when medically appropriate; this was discussed with MD. Recommend continued nutrition support until post op healing is complete as pt has continued to have poor oral intake in hospital.   Medications reviewed and include: insulin, octreotide, protonix, NaCl @50ml /hr, zosyn, vancomycin, TPN  -Selenium- pend, zinc- 46 wnl, vitamin D 35.99, vitamin A- pend- 8/23 -Iodine- 49.8 wnl, chromium- 2.3(H), copper- 93 wnl, manganese- 19.0 wnl- 7/22 -Vitamin B1- 70.3 wnl, B6- 16.4 wnl, vitamin C- 0.4 wnl, vitamin E- 11.8 wnl, vitamin K 1.43 wnl- 7/22 -folate 14.8 wnl, B12- 870 wnl- 7/22 -carnitine 32.0 wnl- 4/12 -aluminum 10(H)- 4/12 --Iron 16(L), TIBC 172, ferritin 86- 8/22  Labs reviewed: Na 150(H), K 4.6 wnl, BUN 44(H), creat 1.05(H), P 4.3 wnl, Mg 2.2 wnl Alk phos 194(H), AST 79(H), ALT 75(H), tbili 3.2(H) Triglycerides- 147 Wbc- 11.2(H), Hgb 8.2(L), Hct 26.2(L) Cbgs- 114, 127, 218, 301, 277, 220 x 48 hrs  Drains-   UOP-   OGT output-   Diet Order:    Diet Order             Diet NPO time specified Except for: Ice Chips, Sips with Meds  Diet effective now                  EDUCATION NEEDS:   Not appropriate for education at this time  Skin:  Skin Assessment: Reviewed RN Assessment- abdominal surgical wound 15 cm x 11 cm x 2 cm, VAC   Last BM:  8/25- type 7  Height:   Ht Readings from Last 1 Encounters:  04/28/23 4\' 11"  (1.499 m)    Weight:   Wt Readings from Last 1 Encounters:  05/03/23 107.4 kg    Ideal Body Weight:  44.3 kg  BMI:   Body mass index is 47.82 kg/m.  Estimated Nutritional Needs:   Kcal:  1900-2200kcal/day  Protein:  95-110g/day  Fluid:  1.4-1.6L/day  Betsey Holiday MS, RD, LDN Please refer to [facility] for RD and/or RD on-call/weekend/after hours pager

## 2023-05-04 NOTE — Progress Notes (Addendum)
PROGRESS NOTE    Debra [name]   WJX:[id number] DOB: Mar 13, 1966  DOA: 02/13/2022 Date of Service: 05/04/23 PCP: Hillery Aldo, MD     Brief Narrative / Hospital Course:  Debra [name] is a 57 y.o. 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 with recurrent disease at anterior pelvis next to the anastomosis which was complicated by high output enterocutaneous fistula , and laparotomy excision of greater omental mass, abdominal wall reconstruction with [name], appendectomy and placement of Prevena vacuum on 06/08, complicated with small bowel perforation, underwent reopening of laparotomy and repair of small bowel perforation, high output enterocutaneous fistula, on chronic TPN since April this year, has had a prolonged hospital stay, complicated with right IJ Port-A-Cath Enterococcus infection which was removed and a new PICC line was placed recently.  Hospitalist team was asked to manage patient's hyperglycemia and coagulopathy.    Significant hospital events:  02/13/22: initial laparotomy, excision of greater omental mass, abdominal wall reconstruction with [name], appendectomy, and placement of Prevena vac  02/15/22: EXPLORATORY LAPAROTOMY REPAIR OF BOWEL PERFORATION, INSERTION OF MESH, APPLICATION OF WOUND VAC 02/26/22:  COLON RESECTION SIGMOID, HERNIA REPAIR VENTRAL, APPENDECTOMY, INSERTION OF MESH, REPAIR ABDOMINAL WALL 07/15/22: Neuro surgery consultation due to L1 compression fracture. Not a candidate for lumbosacral orthotic. 12/19/22: Insertion of Port-a-cath 04/06/23: removal of port-a-cath Throughout stay patient has required TPN, WOC & ID consultation.   Recent hospital events  04/28/23: 12 hours and surgery for abdominal wall reconstruction, recurrent incisional hernia repair, partial gastrectomy, small bowel resection, repair enterotomy x 3 and wound VAC placement.  Patient extubated  postop but too lethargic to maintain airway requiring reintubation and transferred to ICU for postoperative mechanical ventilatory management. 04/29/23: Pt remains mechanically intubated FiO2 40%/PEEP 5.  Pt awake and with purposeful movement on precedex gtt.  Will perform SBT with plans for possible extubation  04/30/23: hospitalist consulted for hyperglycemia and coagulopathy. Overnight fever to 101.8, tachycardic, WBC WNL but concern for SIRS/sepsis 08/23: pro calcitonin (+), starting tx for HCAP. MRSA resp screen recently neg 08/20, will get sputum cx. Echo EF 60-65%, G1DD 08/24: hypernatremic, inflammatory markers up (ferritin high, ALP high), still septic/SIRS, escalating abx, repeat CXR, very cautious fluid administration to help reduce sodium and if increased SOB would d/c fluids and repeat CXR, increased insulin, repeat albumin  08/25: Tachycardia has improved, still tachypnea but no longer meeting sepsis/sirs criteria. Sodium about same 152, Hgb stable. Relatively low UOsm 444 c/w osmotic diuresis (get Glc controlled) / loop diuretics (d/c Lasix) / ADH resistance (correct potassium though hypokalemia is not severe, protein malnutrition is most likely greater factor). Gentle hydration and will need to d/w dietary/pharmacy but malnutrition is going to be a persistent issue. Still substantial fluid loss via NG 08/26: overnight, increased confusion, temp 100.9-101.1, tachycardic, wound vac drainage increased, transferred back to stepdown, switch cefepime + flagyl to Zosyn, continue vanc, follow BCx, neuro-checks, given lasix, CT head no acute process, CT abd/pel no abscess abdominal and multifocal lung infiltrate, CTA chest to r/o PE / eval pneumonia/effusion,       ASSESSMENT & PLAN:   Anasarca/Edema generalized  Hypoalbuminemia seems more likely than CHF but she does have G1 diastolic df  Was on IV lasix but having to back off given AKI and later hypernatremia  Echo shows G1 diastolic  dysfunction  Caution w/ fluids or w/ diuresis   HCAP Meeting sepsis criteria overnight 08/22-08/24 w/ tachycardia and fever (+)  procalcitonin, resolved 05/03/23  Ceftriaxone + azithromycin --> escalated to cover intraabdominal and MRSA w/ cefepime + metronidazole + vancomycin --> zosyn + vanc Sputum culture  DuoNeb prn  Encourage incentive spirometry!!! Overnight 08/26 CT abd no abscess   Grade 1 diastolic dysfunction, preserved LVEF Suspect anasarca more from hypoalbuminemia as opposed to active CHF exacerbation Diuresis if needed but limited d/t AKI and hypernatremia  Albumin dosing periodically  Caution w/ diuresis d/t hypernatremia, Holding lasix unless absolutely needed  Hypernatremia Likely multifactorial - fluid loss / low intake, loop diuretics At least 2L deficit  Obtained urine osm to confirm --> Relatively low UOsm 444 c/w  osmotic diuresis? --> get Glc controlled loop diuretics? --> hold Lasix if at all possible ADH resistance  Hypokalemia? --> correct potassium though hypokalemia is not severe protein malnutrition is most likely greater factor dietician/pharmacy are following, but malnutrition is going to be a persistent challenge.  Gentle IV fluids, D/c D5W and switched to 0.45NS + KCl d/t hyperglycemia - caution given HFpEF as above and also increased insulin, if increased SOB would d/c fluids and repeat CXR --> holding fluids for now trying lasix given substantial fluid overload, signed out to ICU NP low threshold for pressors   Hypokalemia Substantial GI losses via NG Replace as needed, pharmacy following  Monitor BMP  Hypoxic respiratory failure  Likely d/t HCAP Also suspect restrictive component  Supplemental O2 DuoNeb prn Treat underlying causes w/ HCAP abx   AKI - resolved Await echo, concern possible cardiorenal?  Caution w/ diuresis, Holding lasix for now and no worsening edema  Hyperglycemia Most recent A1c 6.2 indicating a condition of  prediabetes at baseline Acute phase of hyperglycemia likely secondary to stress and may be related to TPN. Review 24-hour insulin administration showed 7 unit of sliding scale in total however most occasions glucose still above 200. Insulin titrating up D/c D5W and switched to 0.45NS for hypernatremia  If/when off TPN will need to adjust insulin dosing!    Coagulopathy Mild, chronic, review patient's record showed she has a chronic elevation of INR, at baseline 1.2-1.4 and per patient granddaughter, patient has no known history of easy bleeding, no SLE history in the family.  CT abdomen showed no signs of liver cirrhosis or NASH Given the patient prolonged history of intra-abdominal issues especially involving small bowel, there is concern about vitamin K depletion due to malabsorption. 1 dose of vitamin K subcu given, recommend checking INR periodically and before surgery    Chronic iron deficiency anemia D/t malabsorption. Recent Iron WNL, TIBC low, ferritin high d/t inflammation Periodic CBC                Subjective / Brief ROS:  Patient more somnolent today - fmaily states she is occasionally coherent, talking fine but mostly tired, falls right back to sleep   Family Communication: family at bedside on rounds     Objective Findings:  Vitals:   05/04/23 1600 05/04/23 1613 05/04/23 1700 05/04/23 1800  BP: (!) 107/49  (!) 99/49 (!) 95/47  Pulse: 79 78 78 77  Resp: 11 17 12 20   Temp: 99.6 F (37.6 C)     TempSrc: Oral     SpO2: 98% 98% 98% 97%  Weight:      Height:        Intake/Output Summary (Last 24 hours) at 05/04/2023 1846 Last data filed at 05/04/2023 1843 Gross per 24 hour  Intake 4336.53 ml  Output 7235 ml  Net -2898.47 ml  Filed Weights   05/01/23 0500 05/02/23 0433 05/03/23 0405  Weight: 121.7 kg 93.9 kg 107.4 kg    Examination:  Physical Exam Constitutional:      General: She is not in acute distress.    Appearance: She is obese. She is  ill-appearing. She is not toxic-appearing.  Cardiovascular:     Rate and Rhythm: Normal rate and regular rhythm.  Pulmonary:     Effort: Pulmonary effort is normal. No respiratory distress.     Comments: Exam limited, pt unable to move much to facilitate auscultation Musculoskeletal:     Right lower leg: Edema present.     Left lower leg: Edema present.  Skin:    General: Skin is warm and dry.  Neurological:     Mental Status: She is easily aroused. She is lethargic and disoriented.          Scheduled Medications:   alteplase  2 mg Intracatheter Once   Chlorhexidine Gluconate Cloth  6 each Topical Daily   furosemide  40 mg Intravenous Once   heparin injection (subcutaneous)  5,000 Units Subcutaneous Q8H   insulin aspart  0-20 Units Subcutaneous Q4H   insulin aspart  4 Units Subcutaneous Q4H   insulin glargine-yfgn  22 Units Subcutaneous Daily   lidocaine  2 patch Transdermal Q24H   octreotide  100 mcg Subcutaneous Q8H   pantoprazole (PROTONIX) IV  40 mg Intravenous Q24H   pregabalin  200 mg Per Tube TID   sodium chloride flush  10-40 mL Intracatheter Q12H    Continuous Infusions:  sodium chloride 50 mL/hr at 05/04/23 1843   acetaminophen Stopped (05/04/23 1605)   methocarbamol (ROBAXIN) IV     piperacillin-tazobactam Stopped (05/04/23 1732)   TPN ADULT (ION) 65 mL/hr at 05/04/23 1805   vancomycin 200 mL/hr at 05/04/23 1843    PRN Medications:  dextrose, diphenhydrAMINE **OR** diphenhydrAMINE, fluticasone, HYDROmorphone (DILAUDID) injection, iohexol, ipratropium-albuterol, ketorolac, methocarbamol **OR** methocarbamol (ROBAXIN) IV, metoprolol tartrate, ondansetron **OR** ondansetron (ZOFRAN) IV, mouth rinse, phenol, prochlorperazine, silver nitrate applicators, sodium chloride flush  Antimicrobials from admission:  Anti-infectives (From admission, onward)    Start     Dose/Rate Route Frequency Ordered Stop   05/04/23 1800  vancomycin (VANCOCIN) IVPB 1000 mg/200 mL  premix        1,000 mg 200 mL/hr over 60 Minutes Intravenous Every 24 hours 05/04/23 0755     05/04/23 0600  piperacillin-tazobactam (ZOSYN) IVPB 3.375 g        3.375 g 12.5 mL/hr over 240 Minutes Intravenous Every 8 hours 05/04/23 0236     05/03/23 1800  vancomycin (VANCOCIN) IVPB 1000 mg/200 mL premix  Status:  Discontinued        1,000 mg 200 mL/hr over 60 Minutes Intravenous Every 24 hours 05/02/23 1530 05/03/23 0823   05/03/23 1800  vancomycin (VANCOREADY) IVPB 1250 mg/250 mL  Status:  Discontinued        1,250 mg 166.7 mL/hr over 90 Minutes Intravenous Every 24 hours 05/03/23 0823 05/03/23 1752   05/03/23 1800  Vancomycin (VANCOCIN) 1,250 mg in sodium chloride 0.9 % 250 mL IVPB  Status:  Discontinued        1,250 mg 166.7 mL/hr over 90 Minutes Intravenous Every 24 hours 05/03/23 1752 05/04/23 0755   05/02/23 2000  ceFEPIme (MAXIPIME) 2 g in sodium chloride 0.9 % 100 mL IVPB  Status:  Discontinued        2 g 200 mL/hr over 30 Minutes Intravenous Every 8 hours 05/02/23 1530 05/04/23  0236   05/02/23 1630  vancomycin (VANCOREADY) IVPB 2000 mg/400 mL        2,000 mg 200 mL/hr over 120 Minutes Intravenous  Once 05/02/23 1530 05/02/23 1746   05/02/23 1600  metroNIDAZOLE (FLAGYL) IVPB 500 mg  Status:  Discontinued        500 mg 100 mL/hr over 60 Minutes Intravenous Every 12 hours 05/02/23 1453 05/04/23 0236   05/01/23 1400  azithromycin (ZITHROMAX) 500 mg in sodium chloride 0.9 % 250 mL IVPB  Status:  Discontinued        500 mg 250 mL/hr over 60 Minutes Intravenous Every 24 hours 05/01/23 1255 05/02/23 1453   05/01/23 1230  azithromycin (ZITHROMAX) tablet 500 mg  Status:  Discontinued        500 mg Oral Daily 05/01/23 1135 05/01/23 1255   05/01/23 1200  cefTRIAXone (ROCEPHIN) 2 g in sodium chloride 0.9 % 100 mL IVPB  Status:  Discontinued        2 g 200 mL/hr over 30 Minutes Intravenous Every 24 hours 05/01/23 1135 05/02/23 1453   04/28/23 2200  cefoTEtan (CEFOTAN) 2 g in sodium  chloride 0.9 % 100 mL IVPB        2 g 200 mL/hr over 30 Minutes Intravenous Every 8 hours 04/28/23 2058 04/29/23 1717   04/27/23 0945  cefoTEtan (CEFOTAN) 2 g in sodium chloride 0.9 % 100 mL IVPB        2 g 200 mL/hr over 30 Minutes Intravenous On call to O.R. 04/27/23 0856 04/28/23 0559   04/05/23 0900  cefTRIAXone (ROCEPHIN) 2 g in sodium chloride 0.9 % 100 mL IVPB        2 g 200 mL/hr over 30 Minutes Intravenous Every 24 hours 04/05/23 0634 04/14/23 1254   12/19/22 1000  ceFAZolin (ANCEF) IVPB 2g/100 mL premix        2 g 200 mL/hr over 30 Minutes Intravenous  Once 12/18/22 1738 12/19/22 1141   09/17/22 1200  vancomycin (VANCOREADY) IVPB 1250 mg/250 mL  Status:  Discontinued        1,250 mg 166.7 mL/hr over 90 Minutes Intravenous Every 24 hours 09/16/22 1040 09/22/22 1139   09/16/22 1200  vancomycin (VANCOREADY) IVPB 1250 mg/250 mL        1,250 mg 166.7 mL/hr over 90 Minutes Intravenous  Once 09/15/22 1706 09/16/22 1255   09/15/22 1200  ceFEPIme (MAXIPIME) 2 g in sodium chloride 0.9 % 100 mL IVPB  Status:  Discontinued        2 g 200 mL/hr over 30 Minutes Intravenous Every 8 hours 09/15/22 1024 09/18/22 1026   09/15/22 1200  vancomycin (VANCOREADY) IVPB 2000 mg/400 mL        2,000 mg 200 mL/hr over 120 Minutes Intravenous  Once 09/15/22 1024 09/15/22 1500   03/04/22 2200  piperacillin-tazobactam (ZOSYN) IVPB 3.375 g        3.375 g 12.5 mL/hr over 240 Minutes Intravenous Every 8 hours 03/04/22 2005 03/18/22 0248   02/21/22 1500  vancomycin (VANCOREADY) IVPB 1250 mg/250 mL  Status:  Discontinued        1,250 mg 166.7 mL/hr over 90 Minutes Intravenous Every 24 hours 02/21/22 1111 02/24/22 1336   02/21/22 1400  meropenem (MERREM) 1 g in sodium chloride 0.9 % 100 mL IVPB        1 g 200 mL/hr over 30 Minutes Intravenous Every 8 hours 02/21/22 1258 02/27/22 2157   02/20/22 1500  vancomycin (VANCOCIN) IVPB 1000 mg/200 mL premix  Status:  Discontinued        1,000 mg 200 mL/hr over 60  Minutes Intravenous Every 24 hours 02/19/22 1706 02/21/22 1111   02/19/22 1115  vancomycin (VANCOREADY) IVPB 2000 mg/400 mL        2,000 mg 200 mL/hr over 120 Minutes Intravenous  Once 02/19/22 1025 02/19/22 1732   02/19/22 1115  fluconazole (DIFLUCAN) IVPB 400 mg        400 mg 100 mL/hr over 120 Minutes Intravenous Every 24 hours 02/19/22 1025 03/04/22 1301   02/15/22 0730  piperacillin-tazobactam (ZOSYN) IVPB 3.375 g  Status:  Discontinued        3.375 g 12.5 mL/hr over 240 Minutes Intravenous Every 8 hours 02/15/22 0639 02/21/22 1235   02/15/22 0655  piperacillin-tazobactam (ZOSYN) 3.375 GM/50ML IVPB       Note to Pharmacy: Loretha Stapler N: cabinet override      02/15/22 0655 02/15/22 0816   02/13/22 2200  cefoTEtan (CEFOTAN) 2 g in sodium chloride 0.9 % 100 mL IVPB  Status:  Discontinued        2 g 200 mL/hr over 30 Minutes Intravenous Every 12 hours 02/13/22 1541 02/15/22 0639   02/13/22 1345  cefoTEtan (CEFOTAN) 2 g in sodium chloride 0.9 % 100 mL IVPB        2 g 200 mL/hr over 30 Minutes Intravenous  Once 02/13/22 1331 02/13/22 2146   02/13/22 1336  sodium chloride 0.9 % with cefoTEtan (CEFOTAN) ADS Med       Note to Pharmacy: Gates Rigg: cabinet override      02/13/22 1336 02/14/22 0144   02/13/22 0620  sodium chloride 0.9 % with cefoTEtan (CEFOTAN) ADS Med       Note to Pharmacy: Agnes Lawrence A: cabinet override      02/13/22 0620 02/13/22 0802   02/13/22 0600  cefoTEtan (CEFOTAN) 2 g in sodium chloride 0.9 % 100 mL IVPB        2 g 200 mL/hr over 30 Minutes Intravenous On call to O.R. 02/12/22 2229 02/13/22 2146           Data Reviewed:  I have personally reviewed the following...  CBC: Recent Labs  Lab 04/29/23 1645 04/30/23 0401 04/30/23 0830 05/01/23 0352 05/02/23 0442 05/03/23 0407 05/04/23 0325  WBC 2.6*   < > 6.0 8.6 7.7 7.9 11.2*  NEUTROABS 1.7  --   --   --  5.6 5.6 8.3*  HGB 6.3*   < > 8.4* 8.1* 7.8* 7.8* 8.2*  HCT 18.7*   < > 25.0*  25.3* 24.3* 23.8* 26.2*  MCV 85.4   < > 93.6 90.4 88.4 89.1 93.2  PLT 37*   < > 62* 54* 42* 46* 46*   < > = values in this interval not displayed.   Basic Metabolic Panel: Recent Labs  Lab 04/28/23 2116 04/29/23 0346 04/30/23 0401 05/01/23 0352 05/02/23 0442 05/02/23 1541 05/03/23 0407 05/03/23 1454 05/03/23 1846 05/03/23 2154 05/04/23 0325 05/04/23 1046  NA 138   < > 142 144 151* 152* 152* 151* 148* 149* 149*  150* 150*  K 5.0   < > 4.2 4.7 2.9* 3.1* 3.2*  --   --  4.1 4.6  --   CL 110   < > 110 110 115*  --  119*  --   --  118* 118*  --   CO2 20*   < > 26 26 29   --  27  --   --  25  24  --   GLUCOSE 320*   < > 275* 286* 258*  --  226*  --   --  180* 121*  --   BUN 27*   < > 35* 49* 39*  --  36*  --   --  37* 44*  --   CREATININE 0.83   < > 0.82 1.39* 0.90  --  0.78  --   --  0.90 1.05*  --   CALCIUM 7.3*   < > 7.9* 7.8* 8.2*  --  8.3*  --   --  8.0* 7.9*  --   MG 1.7  --  2.6* 2.4  --   --  2.4  --   --   --  2.2  --   PHOS 5.8*  --  2.8  --   --   --  2.2*  --   --   --  4.3  --    < > = values in this interval not displayed.   GFR: Estimated Creatinine Clearance: 62.7 mL/min (A) (by C-G formula based on SCr of 1.05 mg/dL (H)). Liver Function Tests: Recent Labs  Lab 04/29/23 0346 05/01/23 0352 05/02/23 0442 05/03/23 2154 05/04/23 0325  AST 60* 52* 53* 72* 79*  ALT 45* 49* 50* 74* 75*  ALKPHOS 74 117 167* 192* 194*  BILITOT 1.1 2.2* 2.4* 3.0* 3.2*  PROT 4.4* 4.8* 4.8* 5.1* 4.9*  ALBUMIN 2.2* 2.2* 2.1* 2.3* 2.1*   No results for input(s): "LIPASE", "AMYLASE" in the last 168 hours. No results for input(s): "AMMONIA" in the last 168 hours. Coagulation Profile: Recent Labs  Lab 04/29/23 0346 04/30/23 0508 05/01/23 0352 05/03/23 0407 05/03/23 2154  INR 1.4* 1.6* 1.6* 1.5* 1.5*   Cardiac Enzymes: No results for input(s): "CKTOTAL", "CKMB", "CKMBINDEX", "TROPONINI" in the last 168 hours. BNP (last 3 results) No results for input(s): "PROBNP" in the last 8760  hours. HbA1C: No results for input(s): "HGBA1C" in the last 72 hours. CBG: Recent Labs  Lab 05/03/23 1739 05/03/23 2028 05/03/23 2304 05/04/23 0323 05/04/23 1601  GLUCAP 301* 218* 127* 114* 149*   Lipid Profile: Recent Labs    05/04/23 0325  TRIG 147   Thyroid Function Tests: No results for input(s): "TSH", "T4TOTAL", "FREET4", "T3FREE", "THYROIDAB" in the last 72 hours.  Anemia Panel: Recent Labs    05/02/23 0442  FERRITIN 878*  TIBC 140*  IRON 37   Most Recent Urinalysis On File:     Component Value Date/Time   COLORURINE YELLOW (A) 04/05/2023 0300   APPEARANCEUR HAZY (A) 04/05/2023 0300   LABSPEC 1.016 04/05/2023 0300   PHURINE 6.0 04/05/2023 0300   GLUCOSEU NEGATIVE 04/05/2023 0300   HGBUR NEGATIVE 04/05/2023 0300   BILIRUBINUR NEGATIVE 04/05/2023 0300   KETONESUR NEGATIVE 04/05/2023 0300   PROTEINUR NEGATIVE 04/05/2023 0300   NITRITE NEGATIVE 04/05/2023 0300   LEUKOCYTESUR NEGATIVE 04/05/2023 0300   Sepsis Labs: @LABRCNTIP (procalcitonin:4,lacticidven:4) Microbiology: Recent Results (from the past 240 hour(s))  MRSA Next Gen by PCR, Nasal     Status: None   Collection Time: 04/28/23  8:33 PM   Specimen: Nasal Mucosa; Nasal Swab  Result Value Ref Range Status   MRSA by PCR Next Gen NOT DETECTED NOT DETECTED Final    Comment: (NOTE) The GeneXpert MRSA Assay (FDA approved for NASAL specimens only), is one component of a comprehensive MRSA colonization surveillance program. It is not intended to diagnose MRSA infection nor to guide or monitor treatment for MRSA infections. Test performance is  not FDA approved in patients less than 63 years old. Performed at [facility], [address] [place], [place] [address]   Culture, blood (Routine X 2) w Reflex to ID Panel     Status: None (Preliminary result)   Collection Time: 05/04/23  3:59 AM   Specimen: BLOOD LEFT ARM  Result Value Ref Range Status   Specimen Description BLOOD LEFT ARM  Final    Special Requests   Final    BOTTLES DRAWN AEROBIC AND ANAEROBIC Blood Culture results may not be optimal due to an inadequate volume of blood received in culture bottles   Culture   Final    NO GROWTH < 12 HOURS Performed at [facility], [address] [place], [place] [address]    Report Status PENDING  Incomplete  Culture, blood (Routine X 2) w Reflex to ID Panel     Status: None (Preliminary result)   Collection Time: 05/04/23  3:59 AM   Specimen: BLOOD LEFT HAND  Result Value Ref Range Status   Specimen Description BLOOD LEFT HAND  Final   Special Requests   Final    BOTTLES DRAWN AEROBIC ONLY Blood Culture results may not be optimal due to an inadequate volume of blood received in culture bottles   Culture   Final    NO GROWTH < 12 HOURS Performed at [facility], [address] [place], [place] [address]    Report Status PENDING  Incomplete      Radiology Studies last 3 days: CT Angio Chest Pulmonary Embolism (PE) W or WO Contrast  Result Date: 05/04/2023 CLINICAL DATA:  Tachycardia, fever and increased confusion. Status post recent abdominal surgery. EXAM: CT ANGIOGRAPHY CHEST WITH CONTRAST TECHNIQUE: Multidetector CT imaging of the chest was performed using the standard protocol during bolus administration of intravenous contrast. Multiplanar CT image reconstructions and MIPs were obtained to evaluate the vascular anatomy. RADIATION DOSE REDUCTION: This exam was performed according to the departmental dose-optimization program which includes automated exposure control, adjustment of the mA and/or kV according to patient size and/or use of iterative reconstruction technique. CONTRAST:  75mL OMNIPAQUE IOHEXOL 350 MG/ML SOLN COMPARISON:  CT of the chest 04/01/2023 FINDINGS: Cardiovascular: Pulmonary arteries are adequately opacified. There is no evidence of acute pulmonary embolism. Central pulmonary arteries are normal in caliber. The thoracic aorta is also  well opacified and demonstrates normal caliber without evidence of aneurysm, dissection or atherosclerosis. The heart size is at the upper limits of normal. No pericardial effusion identified. No significant calcified coronary artery plaque visualized. A right-sided PICC line is present with the catheter tip extending into the SVC. Mediastinum/Nodes: No enlarged mediastinal, hilar, or axillary lymph nodes. Thyroid gland, trachea, and esophagus demonstrate no significant findings. Gastric decompression tube extends into the stomach. Lungs/Pleura: Patchy pattern of bilateral parenchymal airspace disease with more upper lobe predominance, right greater than left, but also involvement in the right middle lobe and lower lobes. This is felt to be most likely on the basis of pulmonary edema, although a component of pneumonia may be present, especially atypical pneumonia. There is an associated small left pleural effusion. No pneumothorax. Upper Abdomen: In the visualized upper abdomen there is a tiny focus of air along the left anterior abdominal wall likely related to recent surgery. Musculoskeletal: No chest wall abnormality. No acute or significant osseous findings. Review of the MIP images confirms the above findings. IMPRESSION: 1. No evidence of acute pulmonary embolism. 2. Patchy pattern of bilateral parenchymal airspace  disease with more upper lobe predominance, right greater than left, but also involvement in the right middle lobe and lower lobes. This is felt to be most likely on the basis of pulmonary edema, although a component of pneumonia may be present, especially atypical pneumonia. 3. Small left pleural effusion. 4. Tiny focus of air along the left anterior abdominal wall likely related to recent surgery. Electronically Signed   By: Irish Lack M.D.   On: 05/04/2023 13:43   CT ABDOMEN PELVIS WO CONTRAST  Result Date: 05/04/2023 CLINICAL DATA:  Sepsis, initial encounter EXAM: CT ABDOMEN AND PELVIS  WITHOUT CONTRAST TECHNIQUE: Multidetector CT imaging of the abdomen and pelvis was performed following the standard protocol without IV contrast. RADIATION DOSE REDUCTION: This exam was performed according to the departmental dose-optimization program which includes automated exposure control, adjustment of the mA and/or kV according to patient size and/or use of iterative reconstruction technique. COMPARISON:  04/01/2023 FINDINGS: Lower chest: Left pleural effusion is again identified with left basilar consolidation. Patchy infiltrates are noted throughout both lungs consistent with multifocal pneumonia. Hepatobiliary: No focal liver abnormality is seen. No gallstones, gallbladder wall thickening, or biliary dilatation. Pancreas: Unremarkable. No pancreatic ductal dilatation or surrounding inflammatory changes. Spleen: Normal in size without focal abnormality. Adrenals/Urinary Tract: Adrenal glands are within normal limits. Kidneys are well visualize without renal calculi or obstructive change. No focal mass is seen. The bladder is decompressed by Foley catheter. Stomach/Bowel: Gastric catheter is noted within the stomach. Stomach is decompressed. No obstructive changes of the small bowel are seen. Postsurgical changes are noted within the small bowel anterior consistent with the given clinical history. Surgical drains are seen. Air is noted in the anterior abdominal wall as well as within the peritoneal cavity consistent with the recent surgical history. Wide granulating wound is noted anteriorly. No obstructive changes are seen. No findings to suggest postoperative abscess are noted. Vascular/Lymphatic: No significant vascular findings are present. No enlarged abdominal or pelvic lymph nodes. Reproductive: Uterus and bilateral adnexa are unremarkable. Other: No abdominal wall hernia or abnormality. No abdominopelvic ascites. Musculoskeletal: No acute or significant osseous findings. IMPRESSION: Postoperative  changes are noted with wide anterior abdominal wound and changes consistent with prior small bowel resection. Scattered foci of air are noted within the abdomen as well as within the anterior abdominal wall consistent with the recent surgical history. No discrete abscess is seen. Left effusion stable from the prior exam. Patchy airspace opacities throughout both lungs consistent with multifocal infiltrate. Electronically Signed   By: Alcide Clever M.D.   On: 05/04/2023 03:35   DG Chest 1 View  Result Date: 05/04/2023 CLINICAL DATA:  [id number] with ongoing fever. EXAM: CHEST  1 VIEW COMPARISON:  Portable chest 05/02/2023 3:01 p.m. FINDINGS: 24:28 a.m. NGT enters the stomach with the intragastric course not filmed. Right PICC tip remains at the superior cavoatrial junction. Stable moderate cardiomegaly. There is central vascular prominence, today slightly improved. There is still interstitial consolidation in the lung fields but this is also mildly improved. Patchy hazy opacities in the right lung fields, left mid and lower lung are again noted and may suggest ground-glass edema, pneumonitis or combination. Small left pleural effusion appears similar. In all other respects no further changes. IMPRESSION: 1. Slightly improved central vascular prominence and mild improvement in interstitial consolidation/edema. 2. Patchy hazy opacities in the right lung fields, left mid and lower lung are again noted and may suggest ground-glass edema, pneumonitis or combination. 3. Similar small left pleural effusion. Electronically  Signed   By: Almira Bar M.D.   On: 05/04/2023 03:19   CT HEAD WO CONTRAST ( )  Result Date: 05/04/2023 CLINICAL DATA:  Mental status change, unknown cause EXAM: CT HEAD WITHOUT CONTRAST TECHNIQUE: Contiguous axial images were obtained from the base of the skull through the vertex without intravenous contrast. RADIATION DOSE REDUCTION: This exam was performed according to the departmental  dose-optimization program which includes automated exposure control, adjustment of the mA and/or kV according to patient size and/or use of iterative reconstruction technique. COMPARISON:  07/14/2022 FINDINGS: Brain: No evidence of acute infarction, hemorrhage, mass, mass effect, or midline shift. No hydrocephalus or extra-axial fluid collection. Vascular: No hyperdense vessel. Skull: Negative for fracture or focal lesion. Sinuses/Orbits: Air-fluid level in the right sphenoid sinus. A nasogastric to is noted in the nasal cavity. No acute finding in the orbits. Other: Fluid in the bilateral mastoid air cells. IMPRESSION: No acute intracranial process. Electronically Signed   By: Wiliam Ke M.D.   On: 05/04/2023 03:16   DG Chest Port 1 View  Result Date: 05/02/2023 CLINICAL DATA:  Pneumonia.  Four days post abdominal surgery. EXAM: PORTABLE CHEST 1 VIEW COMPARISON:  Chest yesterday FINDINGS: Enteric tube tip below the diaphragm. Unchanged right subclavian/upper extremity central line with tip overlying the atrial caval junction. Persistent low lung volumes. Prominent heart size, stable. Retrocardiac opacity likely combination of pleural fluid and atelectasis/airspace disease. Central vascular congestion again seen. No pneumothorax. IMPRESSION: 1. Persistent low lung volumes. Retrocardiac opacity likely combination of pleural fluid and atelectasis/airspace disease. 2. Unchanged cardiomegaly and central vascular congestion. Electronically Signed   By: Narda Rutherford M.D.   On: 05/02/2023 18:10   DG Chest 1 View  Result Date: 05/01/2023 CLINICAL DATA:  [id number] with congestive heart failure. EXAM: CHEST  1 VIEW COMPARISON:  Portable chest yesterday at 9:16 p.m. FINDINGS: 6:15 a.m. NGT terminates in the distal gastric body. Right PICC tip is in the right atrium. Stable cardiomegaly but with increased central vascular engorgement. No significant interstitial edema. Small left pleural effusion continues to be seen  with left lower lobe patchy consolidation. There is a low inspiration. Increased haziness in the perihilar areas could be due to low inspiration, pneumonia or edema. IMPRESSION: 1. Increased central vascular engorgement without significant interstitial edema. 2. Increased haziness in the perihilar areas could be due to low inspiration, pneumonia or ground-glass edema. 3. Stable small left pleural effusion with left lower lobe consolidation. Electronically Signed   By: Almira Bar M.D.   On: 05/01/2023 07:26   DG Chest Port 1 View  Result Date: 04/30/2023 CLINICAL DATA:  Fever EXAM: PORTABLE CHEST 1 VIEW COMPARISON:  04/30/2023 FINDINGS: Nasogastric tube extends into the upper abdomen beyond the margin examination. Right upper extremity PICC line tip is seen within the deep right atrium, unchanged from prior examination. Lung volumes are small. Opacification of the left hemithorax has improved suggesting decrease of a posterior layering pleural effusion. Retrocardiac opacification persists in keeping with atelectasis, infiltrate, or posteriorly layering pleural fluid in this location. Right lung is clear. No pneumothorax. No pleural effusion on the right. Stable cardiomegaly. IMPRESSION: 1. Improved aeration of the left hemithorax suggesting decrease of a posterior layering pleural effusion. Persistent retrocardiac opacification in keeping with atelectasis, infiltrate, or posteriorly layering pleural fluid in this location. 2. Pulmonary hypoinflation. 3. Stable cardiomegaly Electronically Signed   By: Helyn Numbers M.D.   On: 04/30/2023 23:55             LOS:  445 days    Time spent: 50 min    Sunnie Nielsen, DO [facility] 05/04/2023, 6:46 PM    Dictation software may have been used to generate the above note. Typos may occur and escape review in typed/dictated notes. Please contact Dr Lyn Hollingshead directly for clarity if needed.  Staff may message me via secure chat in Epic   but this may not receive an immediate response,  please page me for urgent matters!  If 7PM-7AM, please contact night coverage [email or web address]

## 2023-05-04 NOTE — Progress Notes (Signed)
Subjective:  CC: Debra [name] is a 57 y.o. 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   HPI: Called middle of night for increased confusion.  Per verbal report, pt supposedly delirious upon transfer to floor early in the evening.  She was then noted to have increased temp of 100.9, tachycardia.  No episode of hypotenstion but with increased agitation.  Hospitalist called to bedside and due to persistent tachycardia and concerns for worsening mental status, transferred to stepdown.  Also reported increasing drainage from wound vac, over 1.8L recorded overnight  Currently moaning in discomfort.   ROS:  General: Denies weight loss, weight gain, fatigue, fevers, chills, and night sweats. Heart: Denies chest pain, palpitations, racing heart, irregular heartbeat, leg pain or swelling, and decreased activity tolerance. Respiratory: Denies breathing difficulty, shortness of breath, wheezing, cough, and sputum. GI: Denies change in appetite, heartburn, nausea, vomiting, constipation, diarrhea, and blood in stool. GU: Denies difficulty urinating, pain with urinating, urgency, frequency, blood in urine.   Objective:   Temp:  [98.1 F (36.7 C)-101.1 F (38.4 C)] 98.1 F (36.7 C) (08/26 0332) Pulse Rate:  [87-117] 117 (08/26 0230) Resp:  [12-28] 15 (08/26 0332) BP: (104-157)/(46-85) 116/80 (08/26 0332) SpO2:  [96 %-100 %] 100 % (08/26 0332) Weight:  [107.4 kg] 107.4 kg (08/25 0405)     Height: 4\' 11"  (149.9 cm) Weight: 107.4 kg BMI (Calculated): 47.8   Intake/Output this shift:   Intake/Output Summary (Last 24 hours) at 05/04/2023 0344 Last data filed at 05/04/2023 0300 Gross per 24 hour  Intake 5156.32 ml  Output 4085 ml  Net 1071.32 ml    Constitutional :  alert,  cooperative, appears stated age, and no distress  Respiratory:  clear to auscultation bilaterally  Cardiovascular:  Sinus tachycardia  Gastrointestinal: Soft, no guarding, midline wound vac in place.  Some serosanguinous drainage in tubing, slightly thicker in canister.  JP right with some serous mixed with fatty Tissue again, left with lighter serosanguinous drainage . NG with bilious output. No specific TTP on exam  Skin: Cool and moist. Increased edema all four extremities  Psychiatric: Normal affect, non-agitated, not confused       LABS:     Latest Ref Rng & Units 05/03/2023    9:54 PM 05/03/2023    6:46 PM 05/03/2023    2:54 PM  CMP  Glucose 70 - 99 mg/dL 161     BUN 6 - 20 mg/dL 37     Creatinine 0.96 - 1.00 mg/dL 0.45     Sodium 409 - 811 mmol/L 149  148  151   Potassium 3.5 - 5.1 mmol/L 4.1     Chloride 98 - 111 mmol/L 118     CO2 22 - 32 mmol/L 25     Calcium 8.9 - 10.3 mg/dL 8.0     Total Protein 6.5 - 8.1 g/dL 5.1     Total Bilirubin 0.3 - 1.2 mg/dL 3.0     Alkaline Phos 38 - 126 U/L 192     AST 15 - 41 U/L 72     ALT 0 - 44 U/L 74         Latest Ref Rng & Units 05/03/2023    4:07 AM 05/02/2023    4:42 AM 05/01/2023    3:52 AM  CBC  WBC 4.0 -  10.5 K/uL 7.9  7.7  8.6   Hemoglobin 12.0 - 15.0 g/dL 7.8  7.8  8.1   Hematocrit 36.0 - 46.0 % 23.8  24.3  25.3   Platelets 150 - 400 K/uL 46  42  54     RADS: CLINICAL DATA:  Sepsis, initial encounter   EXAM: CT ABDOMEN AND PELVIS WITHOUT CONTRAST   TECHNIQUE: Multidetector CT imaging of the abdomen and pelvis was performed following the standard protocol without IV contrast.   RADIATION DOSE REDUCTION: This exam was performed according to the departmental dose-optimization program which includes automated exposure control, adjustment of the mA and/or kV according to patient size and/or use of iterative reconstruction technique.   COMPARISON:  04/01/2023   FINDINGS: Lower chest: Left pleural effusion is again  identified with left basilar consolidation. Patchy infiltrates are noted throughout both lungs consistent with multifocal pneumonia.   Hepatobiliary: No focal liver abnormality is seen. No gallstones, gallbladder wall thickening, or biliary dilatation.   Pancreas: Unremarkable. No pancreatic ductal dilatation or surrounding inflammatory changes.   Spleen: Normal in size without focal abnormality.   Adrenals/Urinary Tract: Adrenal glands are within normal limits. Kidneys are well visualize without renal calculi or obstructive change. No focal mass is seen. The bladder is decompressed by Foley catheter.   Stomach/Bowel: Gastric catheter is noted within the stomach. Stomach is decompressed. No obstructive changes of the small bowel are seen. Postsurgical changes are noted within the small bowel anterior consistent with the given clinical history. Surgical drains are seen. Air is noted in the anterior abdominal wall as well as within the peritoneal cavity consistent with the recent surgical history. Wide granulating wound is noted anteriorly. No obstructive changes are seen. No findings to suggest postoperative abscess are noted.   Vascular/Lymphatic: No significant vascular findings are present. No enlarged abdominal or pelvic lymph nodes.   Reproductive: Uterus and bilateral adnexa are unremarkable.   Other: No abdominal wall hernia or abnormality. No abdominopelvic ascites.   Musculoskeletal: No acute or significant osseous findings.   IMPRESSION: Postoperative changes are noted with wide anterior abdominal wound and changes consistent with prior small bowel resection. Scattered foci of air are noted within the abdomen as well as within the anterior abdominal wall consistent with the recent surgical history. No discrete abscess is seen.   Left effusion stable from the prior exam.   Patchy airspace opacities throughout both lungs consistent with multifocal infiltrate.      Electronically Signed   By: Alcide Clever M.D.   On: 05/04/2023 03:35  CLINICAL DATA:  Mental status change, unknown cause   EXAM: CT HEAD WITHOUT CONTRAST   TECHNIQUE: Contiguous axial images were obtained from the base of the skull through the vertex without intravenous contrast.   RADIATION DOSE REDUCTION: This exam was performed according to the departmental dose-optimization program which includes automated exposure control, adjustment of the mA and/or kV according to patient size and/or use of iterative reconstruction technique.   COMPARISON:  07/14/2022   FINDINGS: Brain: No evidence of acute infarction, hemorrhage, mass, mass effect, or midline shift. No hydrocephalus or extra-axial fluid collection.   Vascular: No hyperdense vessel.   Skull: Negative for fracture or focal lesion.   Sinuses/Orbits: Air-fluid level in the right sphenoid sinus. A nasogastric to is noted in the nasal cavity. No acute finding in the orbits.   Other: Fluid in the bilateral mastoid air cells.   IMPRESSION: No acute intracranial process.     Electronically Signed  By: Wiliam Ke M.D.   On: 05/04/2023 03:16  CLINICAL DATA:  [id number] with ongoing fever.   EXAM: CHEST  1 VIEW   COMPARISON:  Portable chest 05/02/2023 3:01 p.m.   FINDINGS: 24:28 a.m. NGT enters the stomach with the intragastric course not filmed. Right PICC tip remains at the superior cavoatrial junction.   Stable moderate cardiomegaly. There is central vascular prominence, today slightly improved.   There is still interstitial consolidation in the lung fields but this is also mildly improved.   Patchy hazy opacities in the right lung fields, left mid and lower lung are again noted and may suggest ground-glass edema, pneumonitis or combination.   Small left pleural effusion appears similar. In all other respects no further changes.   IMPRESSION: 1. Slightly improved central vascular prominence and  mild improvement in interstitial consolidation/edema. 2. Patchy hazy opacities in the right lung fields, left mid and lower lung are again noted and may suggest ground-glass edema, pneumonitis or combination. 3. Similar small left pleural effusion.     Electronically Signed   By: Almira Bar M.D.   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   Reported decline in mental status and increased temp and tachycardia. Imaging as noted above.  Additional labs pending, but agree with transfer back to stepdown for closer observation.  Abdominal exam remains unchanged over weekend, with no change in drainage quality of serosanguinous fluid, but increased quantity just in the wound vac.  JP remains stable. F/u on labs for now.  Appreciate hospitalist recs.  labs/images/medications/previous chart entries reviewed personally and relevant changes/updates noted above.

## 2023-05-04 NOTE — Consult Note (Signed)
Pharmacy Antibiotic Note  Debra [name] is a 57 y.o. female admitted with prolonged / complicated hospital admission most recently s/p ExLap with lysis of adhesions, small bowel resection with removal of EC fistula x 3, partial gastrectomy, abdominal wall reconstruction with mesh placement and wound vac placement complicated by post-operative sepsis secondary to pneumonia.  Pharmacy has been consulted for vancomycin and Zosyn dosing.  Plan:  Zosyn 3.375 g IV q8h (4-hr infusion)  Adjust vancomycin to 1 g IV q24h --Calculated AUC: 502.6, Cmin: 13.5 --Daily Scr per protocol --Levels at steady state or as clinically indicated  Height: 4\' 11"  (149.9 cm) Weight: 107.4 kg (236 lb 12.4 oz) IBW/kg (Calculated) : 43.2  Temp (24hrs), Avg:99.9 F (37.7 C), Min:98.1 F (36.7 C), Max:101.1 F (38.4 C)  Recent Labs  Lab 04/27/23 2345 04/28/23 1128 04/28/23 2116 04/29/23 0346 04/29/23 1120 04/29/23 1645 04/30/23 0401 04/30/23 0830 05/01/23 0352 05/02/23 0442 05/03/23 0407 05/03/23 2154 05/04/23 0325  WBC  --    < > 12.8* 9.0  --  2.6*   < > 6.0 8.6 7.7 7.9  --  11.2*  CREATININE  --    < > 0.83 0.78  --   --    < >  --  1.39* 0.90 0.78 0.90 1.05*  LATICACIDVEN 3.5*  --  3.6* 3.4* 4.4* 1.3  --   --   --   --   --   --   --    < > = values in this interval not displayed.    Estimated Creatinine Clearance: 62.7 mL/min (A) (by C-G formula based on SCr of 1.05 mg/dL (H)).    No Known Allergies  Antimicrobials this admission: 8/23 Azithromycin >> 8/24 8/23 Ceftriaxone >> 8/24 8/24 Cefepime >> 8/25 8/24 Vancomycin >> 8/26 Zosyn >>  Dose adjustments this admission:  N/A  Microbiology results: 8/20 MRSA PCR: (-) 8/26 Bcx: NGTD  Thank you for allowing pharmacy to be a part of this patient's care.  [name] 05/04/2023

## 2023-05-04 NOTE — Consult Note (Signed)
WOC Nurse wound follow up Wound type: surgical, abdominal wall construction bilateral myocutaneous flaps with Phasix ST mesh 04/28/23. Measurement:5cm of sutures at the proximal edge of the wound bed, actual open wound 16cm x 12cm x 2cm with undermining circumferentially. 3.5cm area of intact sutures distally  Wound ZOX:WRUEA with mesh, 2 pc of Mepitel covering mesh that is currently intact  Drainage (amount, consistency, odor) yellow, green drainage in the white foam. No active areas of oozing with dressing change  Periwound:intact Family concerned about subcutaneous tissue necrosis from 9-12 oclock, WOC first dressing change with patient. Contacted Dr. Everlene Farrier to notify of this, unclear if this was a change from previous dressing change. He verbalized understanding and we along with surgery will monitor  Dressing procedure/placement/frequency: Removed old NPWT dressing 1pc of Mepitel left in place and added 2nd piece distally to cover mesh Used 2 pc of Mepitel to protect the intact sutures both proximally and distally.  2pc of white foam removed. Using 3 packs of white foam and cut to fit 4 pieces of white foam used this dressing  1pc of silver foam used   Sealed NPWT dressing at HG  Patient received IV tylenol prior to dressing  per bedside nurse prior to dressing change Patient tolerated procedure well    WOC nurse will continue to provide NPWT dressing changed due to the complexity of the dressing change.

## 2023-05-04 NOTE — Plan of Care (Signed)
  Problem: Nutrition: Goal: Adequate nutrition will be maintained Outcome: Not Progressing   Problem: Coping: Goal: Level of anxiety will decrease Outcome: Not Progressing   Problem: Pain Managment: Goal: General experience of comfort will improve Outcome: Not Progressing   Problem: Role Relationship: Goal: Method of communication will improve Outcome: Not Progressing   Problem: Health Behavior/Discharge Planning: Goal: Ability to identify and utilize available resources and services will improve Outcome: Not Progressing   Problem: Health Behavior/Discharge Planning: Goal: Ability to manage health-related needs will improve Outcome: Not Progressing   Problem: Metabolic: Goal: Ability to maintain appropriate glucose levels will improve Outcome: Not Progressing   Problem: Nutritional: Goal: Maintenance of adequate nutrition will improve Outcome: Not Progressing   Problem: Respiratory: Goal: Will regain and/or maintain adequate ventilation Outcome: Progressing   Problem: Urinary Elimination: Goal: Ability to achieve and maintain adequate renal perfusion and functioning will improve Outcome: Progressing

## 2023-05-04 NOTE — TOC CM/SW Note (Signed)
Cm went to pt room to complete LTAC recommendations. Currently receiving care. Will come and talk to pt at another time.

## 2023-05-04 NOTE — Progress Notes (Signed)
Critical care note:  Date of note: 05/04/2023  Subjective: The patient has been febrile this evening with a temp of 101 and BP of 104/46 with a MAP of 62 and slightly tachycardic in the 100.  She has been having high output from his abdominal wound VAC about 100 mL/h and his urine output has been around 28 mL/h.  She was initially given 250 mL IV normal saline bolus and p.o. Tylenol.  She continued to feel febrile with a temperature of one 1.1 after Tylenol but also was noted not to be following commands.  She was still tachycardic in the 110s to 120s.  No significant change in pulse oximetry.  No current nausea or vomiting or worsening pain.  Her daughter was with her and stated that she was nodding her head to some questions but during my interview she was not responding to verbal commands.  Objective: Physical examination: Generally: Acutely ill middle-aged Hispanic female lethargic and unresponsive. Vital signs as above.  Respiratory rate was 17 pulse currently was 97 to 100% on 4 L O2 by HFNC. Head - atraumatic, normocephalic.  Pupils - equal, round and reactive to light and accommodation. Extraocular movements are intact. No scleral icterus.  Oropharynx - moist mucous membranes and tongue. No pharyngeal erythema or exudate.  NG tube in place. Neck - supple. No JVD. Carotid pulses 2+ bilaterally. No carotid bruits. No palpable thyromegaly or lymphadenopathy. Cardiovascular - regular rate and rhythm. Normal S1 and S2. No murmurs, gallops or rubs.  Lungs -diminished bibasal breath sounds with bibasal and midlung zone crackles. Abdomen - soft and nontender.  Midline wound VAC in place with serosanguineous drainage.  JP drain with some serous fluid and moderate serosanguineous drainage.  Positive bowel sounds. No palpable organomegaly or masses.  Extremities - no pitting edema, clubbing or cyanosis.  Neuro - grossly non-focal. Skin - no rashes. Breast, pelvic and rectal - deferred.   Labs and  notes were reviewed. - Stat portable chest x-ray revealed the following: 1. Slightly improved central vascular prominence and mild improvement in interstitial consolidation/edema. 2. Patchy hazy opacities in the right lung fields, left mid and lower lung are again noted and may suggest ground-glass edema, pneumonitis or combination. 3. Similar small left pleural effusion.  Stat head CT scan without contrast revealed no acute intracranial abnormalities. Stat abdominal pelvic CT scan showed the following: Postoperative changes are noted with wide anterior abdominal wound and changes consistent with prior small bowel resection. Scattered foci of air are noted within the abdomen as well as within the anterior abdominal wall consistent with the recent surgical history. No discrete abscess is seen.   Left effusion stable from the prior exam.   Patchy airspace opacities throughout both lungs consistent with multifocal infiltrate.  D-dimer came back elevated at 2.95. - Stat VBG showed a pH 7.34 HCO3 of 25.  CMP revealed a sodium of 149-150 and chloride 118 with a BUN of 44 compared to 37 yesterday a creatinine 1.05 compared to 0.9, calcium 7.9 compared to 8.  Alk phos was 194 compared to 192 and albumin 2.1, AST 79 ALT 75 and total protein 4.9 close to yesterday and total bili 3.2 compared to 3 yesterday.  CBC showed leukocytosis of 11.2 with mild neutrophilia and anemia close to previous levels and thrombocytopenia similar to previous levels.  INR is 1.5 and PT 18.5 last night with PTT 33.  Assessment/plan: - Acute metabolic encephalopathy with associated fever in the setting of healthcare associated pneumonia with  possible associated mild residual pulmonary edema and associated acute hypoxic respiratory failure. 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  -The patient was transferred to stepdown unit bed for closer monitoring. - We will switch cefepime and Flagyl to IV Zosyn. - Continue her IV vancomycin. - We will follow blood cultures. - We will follow neurochecks every 4 hours for 24 hours. - We will give 20 mg of IV Lasix. - O2 protocol will be followed. - We will obtain a chest CTA to rule out PE - Will add TSH. - We will closely monitor mental status.  Authorized and performed by: Valente David, MD Total critical care time:   45    minutes. Due to a high probability of clinically significant, life-threatening deterioration, the patient required my highest level of preparedness to intervene emergently and I personally spent this critical care time directly and personally managing the patient.  This critical care time included obtaining a history, examining the patient, pulse oximetry, ordering and review of studies, arranging urgent treatment with development of management plan, evaluation of patient's response to treatment, frequent reassessment, and discussions with other providers. This critical care time was performed to assess and manage the high probability of imminent, life-threatening deterioration that could result in multiorgan failure.  It was exclusive of separately billable procedures and treating other patients and teaching time.

## 2023-05-04 NOTE — Progress Notes (Signed)
PT Cancellation Note  Patient Details Name: Debra [name] MRN: [id number] DOB: 1966/01/07   Cancelled Treatment:    Reason Eval/Treat Not Completed: Pain limiting ability to participate (Chart reviewed, treatment attempted. Pt in significant pain upon arrival, per RN next tylenol due in ~40 minutes. WIll attempt again later as time permits.)   4:56 PM, 05/04/23 Rosamaria Lints, PT, DPT Physical Therapist - [facility]  [phone] (ASCOM)    [name] 05/04/2023, 4:56 PM

## 2023-05-04 NOTE — TOC Progression Note (Addendum)
Transition of Care [facility]) - Progression Note    Patient Details  Name: Debra [name] MRN: [id number] Date of Birth: 11/13/1965  Transition of Care [facility]) CM/SW Contact  Kreg Shropshire, RN Phone Number: 05/04/2023, 2:58 PM  Clinical Narrative:    Pt has recommendations for LTAC. Cm spoke with pt and family. They are agreeable to LTAC. Cm offered preference on facility and the do not have preference. Cm reached out to the following facilities. Cm notified MD and PT regarding LTAC and insurance  [facility]- Does not accept [place] Medicaid  [facility]- Does not accept [place] Medicaid  [facility]-Does not accept [place] Medicaid       Expected Discharge Plan: Home w Home Health Services Barriers to Discharge: Continued Medical Work up  Expected Discharge Plan and Services                                               Social Determinants of Health (SDOH) Interventions SDOH Screenings   Food Insecurity: No Food Insecurity (05/20/2022)  Housing: Low Risk  (05/20/2022)  Transportation Needs: No Transportation Needs (05/20/2022)  Utilities: Not At Risk (05/20/2022)  Tobacco Use: Low Risk  (05/19/2022)    Readmission Risk Interventions     No data to display

## 2023-05-04 NOTE — Progress Notes (Signed)
PHARMACY NOTE:  ANTIMICROBIAL RENAL DOSAGE ADJUSTMENT  Current antimicrobial regimen includes a mismatch between antimicrobial dosage and estimated renal function.  As per policy approved by the Pharmacy & Therapeutics and Medical Executive Committees, the antimicrobial dosage will be adjusted accordingly.  Current antimicrobial dosage:  Zosyn 3.375 gm IV Q6H over 30 min   Indication: intra-abdominal infection   Renal Function:  Estimated Creatinine Clearance: 73.2 mL/min (by C-G formula based on SCr of 0.9 mg/dL). []      On intermittent HD, scheduled: []      On CRRT    Antimicrobial dosage has been changed to:  Zosyn 3.375 gm IV Q8H EI over 4 hrs   Additional comments:   Thank you for allowing pharmacy to be a part of this patient's care.  Padraig Nhan D, [facility] 05/04/2023 2:39 AM

## 2023-05-04 NOTE — Progress Notes (Signed)
PHARMACY - TOTAL PARENTERAL NUTRITION CONSULT NOTE   Indication: Prolonged ileus  Patient Measurements: Height: 4\' 11"  (149.9 cm) Weight: 107.4 kg (236 lb 12.4 oz) IBW/kg (Calculated) : 43.2 TPN AdjBW (KG): 59 Body mass index is 47.82 kg/m.  Assessment: Debra [name] is a 57 y.o. female s/p laparotomy, excision of greater omental mass, abdominal wall reconstruction with [name], appendectomy, and placement of Prevena vac.  Glucose / Insulin:  BG last 24h: 114 - 301 SSI last 24h: 46u rSSI q4h + Novolog 4u q4h + Semglee 22u daily + 18u insulin in TPN Electrolytes: Hypernatremia, hyperchloremia Renal: SCr 1.05 (baseline ~ 0.5) Hepatic: Mild, stable transaminitis. 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, partial gastrectomy, abdominal wall reconstruction with mesh placement, and wound vac placement   Central access: PICC 04/09/2023   TPN start date: 04/09/23 TPN from 02/15/2022 to 04/05/2023 - held for bacteremia and TPN restarted 04/09/23 Previously Cyclic TPN, transitioned back to regular TPN on 04/29/23 post surgery  RD Assessment:  Estimated Needs Total Energy Estimated Needs: 1900-2200kcal/day Total Protein Estimated Needs: 95-110g/day Total Fluid Estimated Needs: 1.4-1.6L/day  Current Nutrition: NPO  Plan:  --Continue TPN at new goal rate of 65 mL/hr at 1800 --Electrolytes in TPN (standard): Na  50 mEq/L, K 71mEq/L, Ca 5mEq/L, Mg 74mEq/L, and Phos 53mmol/L --Continue Cl:Ac 1:2 for now --Re-adding multivitamin, trace elements, zinc and chromium chloride to TPN --Discussed with MD and will remove insulin from TPN bag. MD will manage glucose via [place] insulin regimen --Continue MIVF with 1/2 NS at 50 cc/hr for now --Monitor TPN labs daily until stable then on Mon/Thurs  [name] 05/04/2023 7:50 AM

## 2023-05-05 LAB — BASIC METABOLIC PANEL
Anion gap: 5 (ref 5–15)
BUN: 51 mg/dL — ABNORMAL HIGH (ref 6–20)
CO2: 24 mmol/L (ref 22–32)
Calcium: 7.4 mg/dL — ABNORMAL LOW (ref 8.9–10.3)
Chloride: 119 mmol/L — ABNORMAL HIGH (ref 98–111)
Creatinine, Ser: 1.38 mg/dL — ABNORMAL HIGH (ref 0.44–1.00)
GFR, Estimated: 44 mL/min — ABNORMAL LOW (ref >60)
Glucose, Bld: 148 mg/dL — ABNORMAL HIGH (ref 70–99)
Potassium: 4 mmol/L (ref 3.5–5.1)
Sodium: 148 mmol/L — ABNORMAL HIGH (ref 135–145)

## 2023-05-05 LAB — GLUCOSE, CAPILLARY
Glucose-Capillary: 127 mg/dL — ABNORMAL HIGH (ref 70–99)
Glucose-Capillary: 145 mg/dL — ABNORMAL HIGH (ref 70–99)
Glucose-Capillary: 159 mg/dL — ABNORMAL HIGH (ref 70–99)
Glucose-Capillary: 138 mg/dL — ABNORMAL HIGH (ref 70–99)

## 2023-05-05 LAB — CBC
HCT: 24.9 % — ABNORMAL LOW (ref 36.0–46.0)
Hemoglobin: 7.5 g/dL — ABNORMAL LOW (ref 12.0–15.0)
MCH: 29.5 pg (ref 26.0–34.0)
MCHC: 30.1 g/dL (ref 30.0–36.0)
MCV: 98 fL (ref 80.0–100.0)
Platelets: 45 10*3/uL — ABNORMAL LOW (ref 150–400)
RBC: 2.54 MIL/uL — ABNORMAL LOW (ref 3.87–5.11)
RDW: 23.5 % — ABNORMAL HIGH (ref 11.5–15.5)
WBC: 5.9 10*3/uL (ref 4.0–10.5)
nRBC: 1.4 % — ABNORMAL HIGH (ref 0.0–0.2)

## 2023-05-05 LAB — MISC LABCORP TEST (SEND OUT): Labcorp test code: 716910

## 2023-05-05 LAB — HEMOGLOBIN AND HEMATOCRIT, BLOOD
HCT: 30.5 % — ABNORMAL LOW (ref 36.0–46.0)
Hemoglobin: 9.7 g/dL — ABNORMAL LOW (ref 12.0–15.0)

## 2023-05-05 LAB — VITAMIN A: Vitamin A (Retinoic Acid): 12.5 ug/dL — ABNORMAL LOW (ref 20.1–62.0)

## 2023-05-05 LAB — PREPARE RBC (CROSSMATCH)

## 2023-05-05 MED ORDER — ACETAMINOPHEN 10 MG/ML IV SOLN
1000.0000 mg | Freq: Four times a day (QID) | INTRAVENOUS | Status: AC
  Administered 2023-05-05 – 2023-05-06 (×4): 1000 mg via INTRAVENOUS
  Filled 2023-05-05 (×4): qty 100

## 2023-05-05 MED ORDER — SODIUM CHLORIDE 0.9% IV SOLUTION: Freq: Once | INTRAVENOUS | Status: DC

## 2023-05-05 MED ORDER — VANCOMYCIN VARIABLE DOSE PER UNSTABLE RENAL FUNCTION (PHARMACIST DOSING): Status: DC

## 2023-05-05 MED ORDER — ZINC CHLORIDE 1 MG/ML IV SOLN
INTRAVENOUS | Status: AC
  Filled 2023-05-05: qty 707.2

## 2023-05-05 MED ORDER — LEVOTHYROXINE SODIUM 100 MCG/5ML IV SOLN
100.0000 ug | Freq: Every day | INTRAVENOUS | Status: AC
  Administered 2023-05-06 – 2023-05-08 (×3): 100 ug via INTRAVENOUS
  Filled 2023-05-05 (×3): qty 5

## 2023-05-05 MED ORDER — SODIUM CHLORIDE 0.9 % IV BOLUS
250.0000 mL | Freq: Once | INTRAVENOUS | Status: AC
  Administered 2023-05-05: 250 mL via INTRAVENOUS

## 2023-05-05 MED ORDER — PANTOPRAZOLE SODIUM 40 MG IV SOLR
40.0000 mg | Freq: Two times a day (BID) | INTRAVENOUS | Status: DC
  Administered 2023-05-05 – 2023-05-17 (×25): 40 mg via INTRAVENOUS
  Filled 2023-05-05 (×26): qty 10

## 2023-05-05 NOTE — Progress Notes (Signed)
CC: POD 7 Subjective:  Fever improved, good UO Right intra-abdominal drain 345cc now seems enteric Fluid in vac is dark serous   Objective: Vital signs in last 24 hours: Temp:  [98.4 F (36.9 C)-99.6 F (37.6 C)] 99.2 F (37.3 C) (08/27 0400) Pulse Rate:  [68-90] 68 (08/27 0800) Resp:  [9-21] 16 (08/27 0800) BP: (80-114)/(47-92) 91/50 (08/27 0800) SpO2:  [97 %-100 %] 100 % (08/27 0800) Weight:  [161 kg] 103 kg (08/27 0500) Last BM Date : 05/03/23  Intake/Output from previous day: 08/26 0701 - 08/27 0700 In: 1983 [I.V.:1204.5; NG/GT:100; IV Piggyback:678.5] Out: 5795 [Urine:2375; Drains:3420] Intake/Output this shift: No intake/output data recorded.  Physical exam:   Awake she is confused Abd: vac in place dar serous fluid Right JP w some light brown output c/w enteric output, no evidence of necrotizing infection  Lab Results: CBC  Recent Labs    05/04/23 0325 05/05/23 0730  WBC 11.2* 5.9  HGB 8.2* 7.5*  HCT 26.2* 24.9*  PLT 46* 45*   BMET Recent Labs    05/04/23 0325 05/04/23 1046 05/05/23 0730  NA 149*  150* 150* 148*  K 4.6  --  4.0  CL 118*  --  119*  CO2 24  --  24  GLUCOSE 121*  --  148*  BUN 44*  --  51*  CREATININE 1.05*  --  1.38*  CALCIUM 7.9*  --  7.4*   PT/INR Recent Labs    05/03/23 0407 05/03/23 2154  LABPROT 18.1* 18.5*  INR 1.5* 1.5*   ABG Recent Labs    05/04/23 0430  HCO3 25.9    Studies/Results: CT Angio Chest Pulmonary Embolism (PE) W or WO Contrast  Result Date: 05/04/2023 CLINICAL DATA:  Tachycardia, fever and increased confusion. Status post recent abdominal surgery. EXAM: CT ANGIOGRAPHY CHEST WITH CONTRAST TECHNIQUE: Multidetector CT imaging of the chest was performed using the standard protocol during bolus administration of intravenous contrast. Multiplanar CT image reconstructions and MIPs were obtained to evaluate the vascular anatomy. RADIATION DOSE REDUCTION: This exam was performed according to the  departmental dose-optimization program which includes automated exposure control, adjustment of the mA and/or kV according to patient size and/or use of iterative reconstruction technique. CONTRAST:  75mL OMNIPAQUE IOHEXOL 350 MG/ML SOLN COMPARISON:  CT of the chest 04/01/2023 FINDINGS: Cardiovascular: Pulmonary arteries are adequately opacified. There is no evidence of acute pulmonary embolism. Central pulmonary arteries are normal in caliber. The thoracic aorta is also well opacified and demonstrates normal caliber without evidence of aneurysm, dissection or atherosclerosis. The heart size is at the upper limits of normal. No pericardial effusion identified. No significant calcified coronary artery plaque visualized. A right-sided PICC line is present with the catheter tip extending into the SVC. Mediastinum/Nodes: No enlarged mediastinal, hilar, or axillary lymph nodes. Thyroid gland, trachea, and esophagus demonstrate no significant findings. Gastric decompression tube extends into the stomach. Lungs/Pleura: Patchy pattern of bilateral parenchymal airspace disease with more upper lobe predominance, right greater than left, but also involvement in the right middle lobe and lower lobes. This is felt to be most likely on the basis of pulmonary edema, although a component of pneumonia may be present, especially atypical pneumonia. There is an associated small left pleural effusion. No pneumothorax. Upper Abdomen: In the visualized upper abdomen there is a tiny focus of air along the left anterior abdominal wall likely related to recent surgery. Musculoskeletal: No chest wall abnormality. No acute or significant osseous findings. Review of the MIP images confirms  the above findings. IMPRESSION: 1. No evidence of acute pulmonary embolism. 2. Patchy pattern of bilateral parenchymal airspace disease with more upper lobe predominance, right greater than left, but also involvement in the right middle lobe and lower lobes.  This is felt to be most likely on the basis of pulmonary edema, although a component of pneumonia may be present, especially atypical pneumonia. 3. Small left pleural effusion. 4. Tiny focus of air along the left anterior abdominal wall likely related to recent surgery. Electronically Signed   By: Irish Lack M.D.   On: 05/04/2023 13:43   CT ABDOMEN PELVIS WO CONTRAST  Result Date: 05/04/2023 CLINICAL DATA:  Sepsis, initial encounter EXAM: CT ABDOMEN AND PELVIS WITHOUT CONTRAST TECHNIQUE: Multidetector CT imaging of the abdomen and pelvis was performed following the standard protocol without IV contrast. RADIATION DOSE REDUCTION: This exam was performed according to the departmental dose-optimization program which includes automated exposure control, adjustment of the mA and/or kV according to patient size and/or use of iterative reconstruction technique. COMPARISON:  04/01/2023 FINDINGS: Lower chest: Left pleural effusion is again identified with left basilar consolidation. Patchy infiltrates are noted throughout both lungs consistent with multifocal pneumonia. Hepatobiliary: No focal liver abnormality is seen. No gallstones, gallbladder wall thickening, or biliary dilatation. Pancreas: Unremarkable. No pancreatic ductal dilatation or surrounding inflammatory changes. Spleen: Normal in size without focal abnormality. Adrenals/Urinary Tract: Adrenal glands are within normal limits. Kidneys are well visualize without renal calculi or obstructive change. No focal mass is seen. The bladder is decompressed by Foley catheter. Stomach/Bowel: Gastric catheter is noted within the stomach. Stomach is decompressed. No obstructive changes of the small bowel are seen. Postsurgical changes are noted within the small bowel anterior consistent with the given clinical history. Surgical drains are seen. Air is noted in the anterior abdominal wall as well as within the peritoneal cavity consistent with the recent surgical  history. Wide granulating wound is noted anteriorly. No obstructive changes are seen. No findings to suggest postoperative abscess are noted. Vascular/Lymphatic: No significant vascular findings are present. No enlarged abdominal or pelvic lymph nodes. Reproductive: Uterus and bilateral adnexa are unremarkable. Other: No abdominal wall hernia or abnormality. No abdominopelvic ascites. Musculoskeletal: No acute or significant osseous findings. IMPRESSION: Postoperative changes are noted with wide anterior abdominal wound and changes consistent with prior small bowel resection. Scattered foci of air are noted within the abdomen as well as within the anterior abdominal wall consistent with the recent surgical history. No discrete abscess is seen. Left effusion stable from the prior exam. Patchy airspace opacities throughout both lungs consistent with multifocal infiltrate. Electronically Signed   By: Alcide Clever M.D.   On: 05/04/2023 03:35   DG Chest 1 View  Result Date: 05/04/2023 CLINICAL DATA:  [id number] with ongoing fever. EXAM: CHEST  1 VIEW COMPARISON:  Portable chest 05/02/2023 3:01 p.m. FINDINGS: 24:28 a.m. NGT enters the stomach with the intragastric course not filmed. Right PICC tip remains at the superior cavoatrial junction. Stable moderate cardiomegaly. There is central vascular prominence, today slightly improved. There is still interstitial consolidation in the lung fields but this is also mildly improved. Patchy hazy opacities in the right lung fields, left mid and lower lung are again noted and may suggest ground-glass edema, pneumonitis or combination. Small left pleural effusion appears similar. In all other respects no further changes. IMPRESSION: 1. Slightly improved central vascular prominence and mild improvement in interstitial consolidation/edema. 2. Patchy hazy opacities in the right lung fields, left mid and lower lung  are again noted and may suggest ground-glass edema, pneumonitis or  combination. 3. Similar small left pleural effusion. Electronically Signed   By: Almira Bar M.D.   On: 05/04/2023 03:19   CT HEAD WO CONTRAST ( )  Result Date: 05/04/2023 CLINICAL DATA:  Mental status change, unknown cause EXAM: CT HEAD WITHOUT CONTRAST TECHNIQUE: Contiguous axial images were obtained from the base of the skull through the vertex without intravenous contrast. RADIATION DOSE REDUCTION: This exam was performed according to the departmental dose-optimization program which includes automated exposure control, adjustment of the mA and/or kV according to patient size and/or use of iterative reconstruction technique. COMPARISON:  07/14/2022 FINDINGS: Brain: No evidence of acute infarction, hemorrhage, mass, mass effect, or midline shift. No hydrocephalus or extra-axial fluid collection. Vascular: No hyperdense vessel. Skull: Negative for fracture or focal lesion. Sinuses/Orbits: Air-fluid level in the right sphenoid sinus. A nasogastric to is noted in the nasal cavity. No acute finding in the orbits. Other: Fluid in the bilateral mastoid air cells. IMPRESSION: No acute intracranial process. Electronically Signed   By: Wiliam Ke M.D.   On: 05/04/2023 03:16    Anti-infectives: Anti-infectives (From admission, onward)    Start     Dose/Rate Route Frequency Ordered Stop   05/05/23 0807  vancomycin variable dose per unstable renal function (pharmacist dosing)  Status:  Discontinued         Does not apply See admin instructions 05/05/23 0807 05/05/23 0823   05/04/23 1800  vancomycin (VANCOCIN) IVPB 1000 mg/200 mL premix  Status:  Discontinued        1,000 mg 200 mL/hr over 60 Minutes Intravenous Every 24 hours 05/04/23 0755 05/05/23 0807   05/04/23 0600  piperacillin-tazobactam (ZOSYN) IVPB 3.375 g        3.375 g 12.5 mL/hr over 240 Minutes Intravenous Every 8 hours 05/04/23 0236     05/03/23 1800  vancomycin (VANCOCIN) IVPB 1000 mg/200 mL premix  Status:  Discontinued        1,000  mg 200 mL/hr over 60 Minutes Intravenous Every 24 hours 05/02/23 1530 05/03/23 0823   05/03/23 1800  vancomycin (VANCOREADY) IVPB 1250 mg/250 mL  Status:  Discontinued        1,250 mg 166.7 mL/hr over 90 Minutes Intravenous Every 24 hours 05/03/23 0823 05/03/23 1752   05/03/23 1800  Vancomycin (VANCOCIN) 1,250 mg in sodium chloride 0.9 % 250 mL IVPB  Status:  Discontinued        1,250 mg 166.7 mL/hr over 90 Minutes Intravenous Every 24 hours 05/03/23 1752 05/04/23 0755   05/02/23 2000  ceFEPIme (MAXIPIME) 2 g in sodium chloride 0.9 % 100 mL IVPB  Status:  Discontinued        2 g 200 mL/hr over 30 Minutes Intravenous Every 8 hours 05/02/23 1530 05/04/23 0236   05/02/23 1630  vancomycin (VANCOREADY) IVPB 2000 mg/400 mL        2,000 mg 200 mL/hr over 120 Minutes Intravenous  Once 05/02/23 1530 05/02/23 1746   05/02/23 1600  metroNIDAZOLE (FLAGYL) IVPB 500 mg  Status:  Discontinued        500 mg 100 mL/hr over 60 Minutes Intravenous Every 12 hours 05/02/23 1453 05/04/23 0236   05/01/23 1400  azithromycin (ZITHROMAX) 500 mg in sodium chloride 0.9 % 250 mL IVPB  Status:  Discontinued        500 mg 250 mL/hr over 60 Minutes Intravenous Every 24 hours 05/01/23 1255 05/02/23 1453   05/01/23 1230  azithromycin (ZITHROMAX) tablet  500 mg  Status:  Discontinued        500 mg Oral Daily 05/01/23 1135 05/01/23 1255   05/01/23 1200  cefTRIAXone (ROCEPHIN) 2 g in sodium chloride 0.9 % 100 mL IVPB  Status:  Discontinued        2 g 200 mL/hr over 30 Minutes Intravenous Every 24 hours 05/01/23 1135 05/02/23 1453   04/28/23 2200  cefoTEtan (CEFOTAN) 2 g in sodium chloride 0.9 % 100 mL IVPB        2 g 200 mL/hr over 30 Minutes Intravenous Every 8 hours 04/28/23 2058 04/29/23 1717   04/27/23 0945  cefoTEtan (CEFOTAN) 2 g in sodium chloride 0.9 % 100 mL IVPB        2 g 200 mL/hr over 30 Minutes Intravenous On call to O.R. 04/27/23 0856 04/28/23 0559   04/05/23 0900  cefTRIAXone (ROCEPHIN) 2 g in sodium  chloride 0.9 % 100 mL IVPB        2 g 200 mL/hr over 30 Minutes Intravenous Every 24 hours 04/05/23 0634 04/14/23 1254   12/19/22 1000  ceFAZolin (ANCEF) IVPB 2g/100 mL premix        2 g 200 mL/hr over 30 Minutes Intravenous  Once 12/18/22 1738 12/19/22 1141   09/17/22 1200  vancomycin (VANCOREADY) IVPB 1250 mg/250 mL  Status:  Discontinued        1,250 mg 166.7 mL/hr over 90 Minutes Intravenous Every 24 hours 09/16/22 1040 09/22/22 1139   09/16/22 1200  vancomycin (VANCOREADY) IVPB 1250 mg/250 mL        1,250 mg 166.7 mL/hr over 90 Minutes Intravenous  Once 09/15/22 1706 09/16/22 1255   09/15/22 1200  ceFEPIme (MAXIPIME) 2 g in sodium chloride 0.9 % 100 mL IVPB  Status:  Discontinued        2 g 200 mL/hr over 30 Minutes Intravenous Every 8 hours 09/15/22 1024 09/18/22 1026   09/15/22 1200  vancomycin (VANCOREADY) IVPB 2000 mg/400 mL        2,000 mg 200 mL/hr over 120 Minutes Intravenous  Once 09/15/22 1024 09/15/22 1500   03/04/22 2200  piperacillin-tazobactam (ZOSYN) IVPB 3.375 g        3.375 g 12.5 mL/hr over 240 Minutes Intravenous Every 8 hours 03/04/22 2005 03/18/22 0248   02/21/22 1500  vancomycin (VANCOREADY) IVPB 1250 mg/250 mL  Status:  Discontinued        1,250 mg 166.7 mL/hr over 90 Minutes Intravenous Every 24 hours 02/21/22 1111 02/24/22 1336   02/21/22 1400  meropenem (MERREM) 1 g in sodium chloride 0.9 % 100 mL IVPB        1 g 200 mL/hr over 30 Minutes Intravenous Every 8 hours 02/21/22 1258 02/27/22 2157   02/20/22 1500  vancomycin (VANCOCIN) IVPB 1000 mg/200 mL premix  Status:  Discontinued        1,000 mg 200 mL/hr over 60 Minutes Intravenous Every 24 hours 02/19/22 1706 02/21/22 1111   02/19/22 1115  vancomycin (VANCOREADY) IVPB 2000 mg/400 mL        2,000 mg 200 mL/hr over 120 Minutes Intravenous  Once 02/19/22 1025 02/19/22 1732   02/19/22 1115  fluconazole (DIFLUCAN) IVPB 400 mg        400 mg 100 mL/hr over 120 Minutes Intravenous Every 24 hours 02/19/22 1025  03/04/22 1301   02/15/22 0730  piperacillin-tazobactam (ZOSYN) IVPB 3.375 g  Status:  Discontinued        3.375 g 12.5 mL/hr over 240 Minutes Intravenous Every  8 hours 02/15/22 0639 02/21/22 1235   02/15/22 0655  piperacillin-tazobactam (ZOSYN) 3.375 GM/50ML IVPB       Note to Pharmacy: Loretha Stapler N: cabinet override      02/15/22 0655 02/15/22 0816   02/13/22 2200  cefoTEtan (CEFOTAN) 2 g in sodium chloride 0.9 % 100 mL IVPB  Status:  Discontinued        2 g 200 mL/hr over 30 Minutes Intravenous Every 12 hours 02/13/22 1541 02/15/22 0639   02/13/22 1345  cefoTEtan (CEFOTAN) 2 g in sodium chloride 0.9 % 100 mL IVPB        2 g 200 mL/hr over 30 Minutes Intravenous  Once 02/13/22 1331 02/13/22 2146   02/13/22 1336  sodium chloride 0.9 % with cefoTEtan (CEFOTAN) ADS Med       Note to Pharmacy: Gates Rigg: cabinet override      02/13/22 1336 02/14/22 0144   02/13/22 0620  sodium chloride 0.9 % with cefoTEtan (CEFOTAN) ADS Med       Note to Pharmacy: Agnes Lawrence A: cabinet override      02/13/22 0620 02/13/22 0802   02/13/22 0600  cefoTEtan (CEFOTAN) 2 g in sodium chloride 0.9 % 100 mL IVPB        2 g 200 mL/hr over 30 Minutes Intravenous On call to O.R. 02/12/22 2229 02/13/22 2146       Assessment/Plan: Contained anastomotic leak  We will manage as fistula, NPO, NGT TPN, A/Bs Will add octreotide Given the complexity of the abdominal wall I think the lesser of the two evils is to manage this non operatively, I will repeat CT tomorrow to look for other un drained collection. Going back to explore her I do think will cause more harm than good, I do not think we will be able to perform diversion given the short mesentery and the large abdominal wall n the setting of significant inflammatory response. Currently her HD are ok and she is making urine. If we explore he   Sterling Big, MD, [facility]  05/05/2023

## 2023-05-05 NOTE — Progress Notes (Signed)
   05/05/23 1700  Spiritual Encounters  Type of Visit Follow up  Care provided to: Pt and family  Reason for visit Routine spiritual support  OnCall Visit Yes   Chaplain provided follow up visit and initiated conversation with patient about all the plants she had in her previous room (this Chaplain has been visiting patient frequently for the past year and a half). When the plants were mentioned, patient lifted her head to look around to see where they were. There was no other indicators of engagement in conversation.

## 2023-05-05 NOTE — Progress Notes (Signed)
PHARMACY - TOTAL PARENTERAL NUTRITION CONSULT NOTE   Indication: Prolonged ileus  Patient Measurements: Height: 4\' 11"  (149.9 cm) Weight: 103 kg (227 lb 1.2 oz) IBW/kg (Calculated) : 43.2 TPN AdjBW (KG): 59 Body mass index is 45.86 kg/m.  Assessment: Debra [name] is a 57 y.o. female s/p laparotomy, excision of greater omental mass, abdominal wall reconstruction with [name], appendectomy, and placement of Prevena vac.  Glucose / Insulin:  BG last 24h: 114 - 149 SSI last 24h: 22u rSSI q4h + Novolog 4u q4h + Semglee 22u daily + insulin removed from TPN 8/26 Electrolytes: Hypernatremia, hyperchloremia Renal: SCr 1.38 (baseline ~ 0.5) Hepatic: Mild, stable transaminitis. 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, partial gastrectomy, abdominal wall reconstruction with mesh placement, and wound vac placement   Central access: PICC 04/09/2023   TPN start date: 04/09/23 TPN from 02/15/2022 to 04/05/2023 - held for bacteremia and TPN restarted 04/09/23 Previously Cyclic TPN, transitioned back to regular TPN on 04/29/23 post surgery  RD Assessment:  Estimated Needs Total Energy Estimated Needs: 1900-2200kcal/day Total Protein Estimated Needs: 95-110g/day Total Fluid Estimated Needs: 1.4-1.6L/day  Current Nutrition: NPO  Plan:  --Continue TPN at new goal rate of 65 mL/hr at 1800 --Electrolytes in TPN (standard): Na 50 mEq/L, K  57mEq/L, Ca 3mEq/L, Mg 9mEq/L, and Phos 9mmol/L --Continue Cl:Ac 1:2 for now --Re-adding multivitamin, trace elements, zinc and chromium chloride to TPN --Discussed with MD and will remove insulin from TPN bag. MD will manage glucose via [place] insulin regimen --Monitor TPN labs daily until stable then on Mon/Thurs  [name] 05/05/2023 8:24 AM

## 2023-05-05 NOTE — Progress Notes (Addendum)
PROGRESS NOTE    Rina Brinser   ZOX:[id number] DOB: August 17, 1966  DOA: 02/13/2022 Date of Service: 05/05/23 PCP: Hillery Aldo, MD     Brief Narrative / Hospital Course:  Debra [name] is a 57 y.o. 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 with recurrent disease at anterior pelvis next to the anastomosis which was complicated by high output enterocutaneous fistula , and laparotomy excision of greater omental mass, abdominal wall reconstruction with [name], appendectomy and placement of Prevena vacuum on 06/08, complicated with small bowel perforation, underwent reopening of laparotomy and repair of small bowel perforation, high output enterocutaneous fistula, on chronic TPN since April this year, has had a prolonged hospital stay, complicated with right IJ Port-A-Cath Enterococcus infection which was removed and a new PICC line was placed recently.  Hospitalist team was asked to manage patient's hyperglycemia and coagulopathy.    Significant hospital events:  02/13/22: initial laparotomy, excision of greater omental mass, abdominal wall reconstruction with [name], appendectomy, and placement of Prevena vac  02/15/22: EXPLORATORY LAPAROTOMY REPAIR OF BOWEL PERFORATION, INSERTION OF MESH, APPLICATION OF WOUND VAC 02/26/22:  COLON RESECTION SIGMOID, HERNIA REPAIR VENTRAL, APPENDECTOMY, INSERTION OF MESH, REPAIR ABDOMINAL WALL 07/15/22: Neuro surgery consultation due to L1 compression fracture. Not a candidate for lumbosacral orthotic. 12/19/22: Insertion of Port-a-cath 04/06/23: removal of port-a-cath Throughout stay patient has required TPN, WOC & ID consultation.   Recent hospital events  04/28/23: 12 hours and surgery for abdominal wall reconstruction, recurrent incisional hernia repair, partial gastrectomy, small bowel resection, repair enterotomy x 3 and wound VAC placement.  Patient extubated  postop but too lethargic to maintain airway requiring reintubation and transferred to ICU for postoperative mechanical ventilatory management. 04/29/23: Pt remains mechanically intubated FiO2 40%/PEEP 5.  Pt awake and with purposeful movement on precedex gtt.  Will perform SBT with plans for possible extubation  04/30/23: hospitalist consulted for hyperglycemia and coagulopathy. Overnight fever to 101.8, tachycardic, WBC WNL but concern for SIRS/sepsis 08/23: pro calcitonin (+), starting tx for HCAP. MRSA resp screen recently neg 08/20, will get sputum cx. Echo EF 60-65%, G1DD 08/24: hypernatremic, inflammatory markers up (ferritin high, ALP high), still septic/SIRS, escalating abx, repeat CXR, very cautious fluid administration to help reduce sodium and if increased SOB would d/c fluids and repeat CXR, increased insulin, repeat albumin  08/25: Tachycardia has improved, still tachypnea but no longer meeting sepsis/sirs criteria. Sodium about same 152, Hgb stable. Relatively low UOsm 444 c/w osmotic diuresis (get Glc controlled) / loop diuretics (d/c Lasix) / ADH resistance (correct potassium though hypokalemia is not severe, protein malnutrition is most likely greater factor). Gentle hydration and will need to d/w dietary/pharmacy but malnutrition is going to be a persistent issue. Still substantial fluid loss via NG 08/26: overnight, increased confusion, temp 100.9-101.1, tachycardic, wound vac drainage increased, transferred back to stepdown, switch cefepime + flagyl to Zosyn, continue vanc, follow BCx, neuro-checks, given lasix, CT head no acute process, CT abd/pel no abscess abdominal and multifocal lung infiltrate, CTA chest to r/o PE / eval pneumonia/effusion --> no PE, (+)pulmonary edema. WIll hold fluids and try diuresis tonight, hopefully sodium and BP will hold, signed otu to night team in ICU in case needing pressors 08/27: stable this morning, sodium slightly better, HH worse, will monitor closely,  n oUOP documentd, follow  closely .       ASSESSMENT & PLAN:   Anasarca/Edema generalized  Hypoalbuminemia seems more likely than  CHF but she does have G1 diastolic df  Was on IV lasix but having to back off given AKI and later hypernatremia  Echo shows G1 diastolic dysfunction  Caution w/ fluids or w/ diuresis   HCAP Meeting sepsis criteria overnight 08/22-08/24 w/ tachycardia and fever (+)procalcitonin, resolved 05/03/23  Ceftriaxone + azithromycin --> escalated to cover intraabdominal and MRSA w/ cefepime + metronidazole + vancomycin --> zosyn + vanc Sputum culture  DuoNeb prn  Encourage incentive spirometry!!! Overnight 08/26 CT abd no abscess   Grade 1 diastolic dysfunction, preserved LVEF Suspect anasarca more from hypoalbuminemia as opposed to active CHF exacerbation Diuresis if needed but limited d/t AKI and hypernatremia  Albumin dosing periodically  Caution w/ diuresis d/t hypernatremia, Holding lasix unless absolutely needed  Hypernatremia - improving  Likely multifactorial - fluid loss / low intake, loop diuretics At least 2L deficit  Obtained urine osm to confirm --> Relatively low UOsm 444 c/w  osmotic diuresis? --> get Glc controlled loop diuretics? --> hold Lasix if at all possible ADH resistance  Hypokalemia? --> correct potassium though hypokalemia is not severe protein malnutrition is most likely greater factor dietician/pharmacy are following, but malnutrition is going to be a persistent challenge.  Was on 0.45NS + KCl d/t hyperglycemia (vs D5W) if increased SOB would d/c fluids and repeat CXR --> 08/26 holding fluids for now trying lasix given substantial fluid overload, signed out to ICU NP low threshold for pressors   Hypokalemia Substantial GI losses via NG Replace as needed, pharmacy following  Monitor BMP  Hypoxic respiratory failure  Likely d/t HCAP Also suspect restrictive component  Supplemental O2 DuoNeb prn Treat underlying causes w/  HCAP abx   AKI - resolved Await echo, concern possible cardiorenal?  Caution w/ diuresis, Holding lasix for now and no worsening edema  Hyperglycemia Most recent A1c 6.2 indicating a condition of prediabetes at baseline Acute phase of hyperglycemia likely secondary to stress and may be related to TPN. Review 24-hour insulin administration showed 7 unit of sliding scale in total however most occasions glucose still above 200. Insulin titrating up D/c D5W and switched to 0.45NS for hypernatremia  If/when off TPN will need to adjust insulin dosing!    Coagulopathy Mild, chronic, review patient's record showed she has a chronic elevation of INR, at baseline 1.2-1.4 and per patient granddaughter, patient has no known history of easy bleeding, no SLE history in the family.  CT abdomen showed no signs of liver cirrhosis or NASH. Getting vitamin K w/ TPN.  recommend checking INR periodically and before surgery    Chronic iron deficiency anemia D/t malabsorption. Recent Iron WNL, TIBC low, ferritin high d/t inflammation Periodic CBC - lower today, low thrshold for transfusion, will repeat HH this afternoon                    Subjective / Brief ROS:  Patient more somnolent lately  - fmaily states she is talking a bit more today. I speak some medical spanish - family has no questions and were updated that her blood work is looking a little better today w/ sodium, we are watching her fluid status    Family Communication: family at bedside on rounds     Objective Findings:  Vitals:   05/05/23 0700 05/05/23 0800 05/05/23 0900 05/05/23 1000  BP: (!) 100/53 (!) 91/50 (!) 93/55 (!) 95/51  Pulse: 71 68 67 70  Resp: 16 16 14 16   Temp:   98.1 F (36.7 C)  TempSrc:   Axillary   SpO2: 100% 100% 100% 99%  Weight:      Height:        Intake/Output Summary (Last 24 hours) at 05/05/2023 1030 Last data filed at 05/05/2023 7322 Gross per 24 hour  Intake 2002.72 ml  Output 3580 ml   Net -1577.28 ml   Filed Weights   05/02/23 0433 05/03/23 0405 05/05/23 0500  Weight: 93.9 kg 107.4 kg 103 kg    Examination:  Physical Exam Constitutional:      General: She is not in acute distress.    Appearance: She is obese. She is ill-appearing. She is not toxic-appearing.  Cardiovascular:     Rate and Rhythm: Normal rate and regular rhythm.  Pulmonary:     Effort: Pulmonary effort is normal. No respiratory distress.     Comments: Exam limited, pt unable to move much to facilitate auscultation Musculoskeletal:     Right lower leg: Edema present.     Left lower leg: Edema present.  Skin:    General: Skin is warm and dry.  Neurological:     Mental Status: She is easily aroused. She is lethargic and disoriented.          Scheduled Medications:   alteplase  2 mg Intracatheter Once   Chlorhexidine Gluconate Cloth  6 each Topical Daily   heparin injection (subcutaneous)  5,000 Units Subcutaneous Q8H   insulin aspart  0-20 Units Subcutaneous Q4H   insulin aspart  4 Units Subcutaneous Q4H   insulin glargine-yfgn  22 Units Subcutaneous Daily   [START ON 05/06/2023] levothyroxine  100 mcg Intravenous Daily   lidocaine  2 patch Transdermal Q24H   octreotide  100 mcg Subcutaneous Q8H   pantoprazole (PROTONIX) IV  40 mg Intravenous Q12H   pregabalin  200 mg Per Tube TID   sodium chloride flush  10-40 mL Intracatheter Q12H    Continuous Infusions:  acetaminophen 1,000 mg (05/05/23 1009)   methocarbamol (ROBAXIN) IV     piperacillin-tazobactam 12.5 mL/hr at 05/05/23 0928   TPN ADULT (ION) 65 mL/hr at 05/04/23 1805   TPN ADULT (ION)      PRN Medications:  dextrose, diphenhydrAMINE **OR** diphenhydrAMINE, fluticasone, HYDROmorphone (DILAUDID) injection, iohexol, ipratropium-albuterol, methocarbamol **OR** methocarbamol (ROBAXIN) IV, metoprolol tartrate, ondansetron **OR** ondansetron (ZOFRAN) IV, mouth rinse, phenol, prochlorperazine, silver nitrate applicators, sodium  chloride flush  Antimicrobials from admission:  Anti-infectives (From admission, onward)    Start     Dose/Rate Route Frequency Ordered Stop   05/05/23 0807  vancomycin variable dose per unstable renal function (pharmacist dosing)  Status:  Discontinued         Does not apply See admin instructions 05/05/23 0807 05/05/23 0823   05/04/23 1800  vancomycin (VANCOCIN) IVPB 1000 mg/200 mL premix  Status:  Discontinued        1,000 mg 200 mL/hr over 60 Minutes Intravenous Every 24 hours 05/04/23 0755 05/05/23 0807   05/04/23 0600  piperacillin-tazobactam (ZOSYN) IVPB 3.375 g        3.375 g 12.5 mL/hr over 240 Minutes Intravenous Every 8 hours 05/04/23 0236     05/03/23 1800  vancomycin (VANCOCIN) IVPB 1000 mg/200 mL premix  Status:  Discontinued        1,000 mg 200 mL/hr over 60 Minutes Intravenous Every 24 hours 05/02/23 1530 05/03/23 0823   05/03/23 1800  vancomycin (VANCOREADY) IVPB 1250 mg/250 mL  Status:  Discontinued        1,250 mg 166.7 mL/hr over 90 Minutes  Intravenous Every 24 hours 05/03/23 0823 05/03/23 1752   05/03/23 1800  Vancomycin (VANCOCIN) 1,250 mg in sodium chloride 0.9 % 250 mL IVPB  Status:  Discontinued        1,250 mg 166.7 mL/hr over 90 Minutes Intravenous Every 24 hours 05/03/23 1752 05/04/23 0755   05/02/23 2000  ceFEPIme (MAXIPIME) 2 g in sodium chloride 0.9 % 100 mL IVPB  Status:  Discontinued        2 g 200 mL/hr over 30 Minutes Intravenous Every 8 hours 05/02/23 1530 05/04/23 0236   05/02/23 1630  vancomycin (VANCOREADY) IVPB 2000 mg/400 mL        2,000 mg 200 mL/hr over 120 Minutes Intravenous  Once 05/02/23 1530 05/02/23 1746   05/02/23 1600  metroNIDAZOLE (FLAGYL) IVPB 500 mg  Status:  Discontinued        500 mg 100 mL/hr over 60 Minutes Intravenous Every 12 hours 05/02/23 1453 05/04/23 0236   05/01/23 1400  azithromycin (ZITHROMAX) 500 mg in sodium chloride 0.9 % 250 mL IVPB  Status:  Discontinued        500 mg 250 mL/hr over 60 Minutes Intravenous Every  24 hours 05/01/23 1255 05/02/23 1453   05/01/23 1230  azithromycin (ZITHROMAX) tablet 500 mg  Status:  Discontinued        500 mg Oral Daily 05/01/23 1135 05/01/23 1255   05/01/23 1200  cefTRIAXone (ROCEPHIN) 2 g in sodium chloride 0.9 % 100 mL IVPB  Status:  Discontinued        2 g 200 mL/hr over 30 Minutes Intravenous Every 24 hours 05/01/23 1135 05/02/23 1453   04/28/23 2200  cefoTEtan (CEFOTAN) 2 g in sodium chloride 0.9 % 100 mL IVPB        2 g 200 mL/hr over 30 Minutes Intravenous Every 8 hours 04/28/23 2058 04/29/23 1717   04/27/23 0945  cefoTEtan (CEFOTAN) 2 g in sodium chloride 0.9 % 100 mL IVPB        2 g 200 mL/hr over 30 Minutes Intravenous On call to O.R. 04/27/23 0856 04/28/23 0559   04/05/23 0900  cefTRIAXone (ROCEPHIN) 2 g in sodium chloride 0.9 % 100 mL IVPB        2 g 200 mL/hr over 30 Minutes Intravenous Every 24 hours 04/05/23 0634 04/14/23 1254   12/19/22 1000  ceFAZolin (ANCEF) IVPB 2g/100 mL premix        2 g 200 mL/hr over 30 Minutes Intravenous  Once 12/18/22 1738 12/19/22 1141   09/17/22 1200  vancomycin (VANCOREADY) IVPB 1250 mg/250 mL  Status:  Discontinued        1,250 mg 166.7 mL/hr over 90 Minutes Intravenous Every 24 hours 09/16/22 1040 09/22/22 1139   09/16/22 1200  vancomycin (VANCOREADY) IVPB 1250 mg/250 mL        1,250 mg 166.7 mL/hr over 90 Minutes Intravenous  Once 09/15/22 1706 09/16/22 1255   09/15/22 1200  ceFEPIme (MAXIPIME) 2 g in sodium chloride 0.9 % 100 mL IVPB  Status:  Discontinued        2 g 200 mL/hr over 30 Minutes Intravenous Every 8 hours 09/15/22 1024 09/18/22 1026   09/15/22 1200  vancomycin (VANCOREADY) IVPB 2000 mg/400 mL        2,000 mg 200 mL/hr over 120 Minutes Intravenous  Once 09/15/22 1024 09/15/22 1500   03/04/22 2200  piperacillin-tazobactam (ZOSYN) IVPB 3.375 g        3.375 g 12.5 mL/hr over 240 Minutes Intravenous Every 8 hours  03/04/22 2005 03/18/22 0248   02/21/22 1500  vancomycin (VANCOREADY) IVPB 1250 mg/250 mL   Status:  Discontinued        1,250 mg 166.7 mL/hr over 90 Minutes Intravenous Every 24 hours 02/21/22 1111 02/24/22 1336   02/21/22 1400  meropenem (MERREM) 1 g in sodium chloride 0.9 % 100 mL IVPB        1 g 200 mL/hr over 30 Minutes Intravenous Every 8 hours 02/21/22 1258 02/27/22 2157   02/20/22 1500  vancomycin (VANCOCIN) IVPB 1000 mg/200 mL premix  Status:  Discontinued        1,000 mg 200 mL/hr over 60 Minutes Intravenous Every 24 hours 02/19/22 1706 02/21/22 1111   02/19/22 1115  vancomycin (VANCOREADY) IVPB 2000 mg/400 mL        2,000 mg 200 mL/hr over 120 Minutes Intravenous  Once 02/19/22 1025 02/19/22 1732   02/19/22 1115  fluconazole (DIFLUCAN) IVPB 400 mg        400 mg 100 mL/hr over 120 Minutes Intravenous Every 24 hours 02/19/22 1025 03/04/22 1301   02/15/22 0730  piperacillin-tazobactam (ZOSYN) IVPB 3.375 g  Status:  Discontinued        3.375 g 12.5 mL/hr over 240 Minutes Intravenous Every 8 hours 02/15/22 0639 02/21/22 1235   02/15/22 0655  piperacillin-tazobactam (ZOSYN) 3.375 GM/50ML IVPB       Note to Pharmacy: Loretha Stapler N: cabinet override      02/15/22 0655 02/15/22 0816   02/13/22 2200  cefoTEtan (CEFOTAN) 2 g in sodium chloride 0.9 % 100 mL IVPB  Status:  Discontinued        2 g 200 mL/hr over 30 Minutes Intravenous Every 12 hours 02/13/22 1541 02/15/22 0639   02/13/22 1345  cefoTEtan (CEFOTAN) 2 g in sodium chloride 0.9 % 100 mL IVPB        2 g 200 mL/hr over 30 Minutes Intravenous  Once 02/13/22 1331 02/13/22 2146   02/13/22 1336  sodium chloride 0.9 % with cefoTEtan (CEFOTAN) ADS Med       Note to Pharmacy: Gates Rigg: cabinet override      02/13/22 1336 02/14/22 0144   02/13/22 0620  sodium chloride 0.9 % with cefoTEtan (CEFOTAN) ADS Med       Note to Pharmacy: Agnes Lawrence A: cabinet override      02/13/22 0620 02/13/22 0802   02/13/22 0600  cefoTEtan (CEFOTAN) 2 g in sodium chloride 0.9 % 100 mL IVPB        2 g 200 mL/hr over 30 Minutes  Intravenous On call to O.R. 02/12/22 2229 02/13/22 2146           Data Reviewed:  I have personally reviewed the following...  CBC: Recent Labs  Lab 04/29/23 1645 04/30/23 0401 05/01/23 0352 05/02/23 0442 05/03/23 0407 05/04/23 0325 05/05/23 0730  WBC 2.6*   < > 8.6 7.7 7.9 11.2* 5.9  NEUTROABS 1.7  --   --  5.6 5.6 8.3*  --   HGB 6.3*   < > 8.1* 7.8* 7.8* 8.2* 7.5*  HCT 18.7*   < > 25.3* 24.3* 23.8* 26.2* 24.9*  MCV 85.4   < > 90.4 88.4 89.1 93.2 98.0  PLT 37*   < > 54* 42* 46* 46* 45*   < > = values in this interval not displayed.   Basic Metabolic Panel: Recent Labs  Lab 04/28/23 2116 04/29/23 0346 04/30/23 0401 05/01/23 0352 05/02/23 0442 05/02/23 1541 05/03/23 0407 05/03/23 1454 05/03/23 1846 05/03/23  2154 05/04/23 0325 05/04/23 1046 05/05/23 0730  NA 138   < > 142 144 151* 152* 152*   < > 148* 149* 149*  150* 150* 148*  K 5.0   < > 4.2 4.7 2.9* 3.1* 3.2*  --   --  4.1 4.6  --  4.0  CL 110   < > 110 110 115*  --  119*  --   --  118* 118*  --  119*  CO2 20*   < > 26 26 29   --  27  --   --  25 24  --  24  GLUCOSE 320*   < > 275* 286* 258*  --  226*  --   --  180* 121*  --  148*  BUN 27*   < > 35* 49* 39*  --  36*  --   --  37* 44*  --  51*  CREATININE 0.83   < > 0.82 1.39* 0.90  --  0.78  --   --  0.90 1.05*  --  1.38*  CALCIUM 7.3*   < > 7.9* 7.8* 8.2*  --  8.3*  --   --  8.0* 7.9*  --  7.4*  MG 1.7  --  2.6* 2.4  --   --  2.4  --   --   --  2.2  --   --   PHOS 5.8*  --  2.8  --   --   --  2.2*  --   --   --  4.3  --   --    < > = values in this interval not displayed.   GFR: Estimated Creatinine Clearance: 46.5 mL/min (A) (by C-G formula based on SCr of 1.38 mg/dL (H)). Liver Function Tests: Recent Labs  Lab 04/29/23 0346 05/01/23 0352 05/02/23 0442 05/03/23 2154 05/04/23 0325  AST 60* 52* 53* 72* 79*  ALT 45* 49* 50* 74* 75*  ALKPHOS 74 117 167* 192* 194*  BILITOT 1.1 2.2* 2.4* 3.0* 3.2*  PROT 4.4* 4.8* 4.8* 5.1* 4.9*  ALBUMIN 2.2* 2.2*  2.1* 2.3* 2.1*   No results for input(s): "LIPASE", "AMYLASE" in the last 168 hours. No results for input(s): "AMMONIA" in the last 168 hours. Coagulation Profile: Recent Labs  Lab 04/29/23 0346 04/30/23 0508 05/01/23 0352 05/03/23 0407 05/03/23 2154  INR 1.4* 1.6* 1.6* 1.5* 1.5*   Cardiac Enzymes: No results for input(s): "CKTOTAL", "CKMB", "CKMBINDEX", "TROPONINI" in the last 168 hours. BNP (last 3 results) No results for input(s): "PROBNP" in the last 8760 hours. HbA1C: No results for input(s): "HGBA1C" in the last 72 hours. CBG: Recent Labs  Lab 05/04/23 1601 05/04/23 1954 05/04/23 2352 05/05/23 0336 05/05/23 0742  GLUCAP 149* 136* 134* 127* 145*   Lipid Profile: Recent Labs    05/04/23 0325  TRIG 147   Thyroid Function Tests: No results for input(s): "TSH", "T4TOTAL", "FREET4", "T3FREE", "THYROIDAB" in the last 72 hours.  Anemia Panel: No results for input(s): "VITAMINB12", "FOLATE", "FERRITIN", "TIBC", "IRON", "RETICCTPCT" in the last 72 hours.  Most Recent Urinalysis On File:     Component Value Date/Time   COLORURINE YELLOW (A) 04/05/2023 0300   APPEARANCEUR HAZY (A) 04/05/2023 0300   LABSPEC 1.016 04/05/2023 0300   PHURINE 6.0 04/05/2023 0300   GLUCOSEU NEGATIVE 04/05/2023 0300   HGBUR NEGATIVE 04/05/2023 0300   BILIRUBINUR NEGATIVE 04/05/2023 0300   KETONESUR NEGATIVE 04/05/2023 0300   PROTEINUR NEGATIVE 04/05/2023 0300   NITRITE NEGATIVE 04/05/2023 0300  LEUKOCYTESUR NEGATIVE 04/05/2023 0300   Sepsis Labs: @LABRCNTIP (procalcitonin:4,lacticidven:4) Microbiology: Recent Results (from the past 240 hour(s))  MRSA Next Gen by PCR, Nasal     Status: None   Collection Time: 04/28/23  8:33 PM   Specimen: Nasal Mucosa; Nasal Swab  Result Value Ref Range Status   MRSA by PCR Next Gen NOT DETECTED NOT DETECTED Final    Comment: (NOTE) The GeneXpert MRSA Assay (FDA approved for NASAL specimens only), is one component of a comprehensive MRSA  colonization surveillance program. It is not intended to diagnose MRSA infection nor to guide or monitor treatment for MRSA infections. Test performance is not FDA approved in patients less than 51 years old. Performed at [facility], [address] [place], [place] [address]   Culture, blood (Routine X 2) w Reflex to ID Panel     Status: None (Preliminary result)   Collection Time: 05/04/23  3:59 AM   Specimen: BLOOD LEFT ARM  Result Value Ref Range Status   Specimen Description BLOOD LEFT ARM  Final   Special Requests   Final    BOTTLES DRAWN AEROBIC AND ANAEROBIC Blood Culture results may not be optimal due to an inadequate volume of blood received in culture bottles   Culture   Final    NO GROWTH 1 DAY Performed at [facility], [address], [place], [place] [address]    Report Status PENDING  Incomplete  Culture, blood (Routine X 2) w Reflex to ID Panel     Status: None (Preliminary result)   Collection Time: 05/04/23  3:59 AM   Specimen: BLOOD LEFT HAND  Result Value Ref Range Status   Specimen Description BLOOD LEFT HAND  Final   Special Requests   Final    BOTTLES DRAWN AEROBIC ONLY Blood Culture results may not be optimal due to an inadequate volume of blood received in culture bottles   Culture   Final    NO GROWTH 1 DAY Performed at [facility], [address] [place], [place] [address]    Report Status PENDING  Incomplete      Radiology Studies last 3 days: CT Angio Chest Pulmonary Embolism (PE) W or WO Contrast  Result Date: 05/04/2023 CLINICAL DATA:  Tachycardia, fever and increased confusion. Status post recent abdominal surgery. EXAM: CT ANGIOGRAPHY CHEST WITH CONTRAST TECHNIQUE: Multidetector CT imaging of the chest was performed using the standard protocol during bolus administration of intravenous contrast. Multiplanar CT image reconstructions and MIPs were obtained to evaluate the vascular anatomy. RADIATION DOSE REDUCTION:  This exam was performed according to the departmental dose-optimization program which includes automated exposure control, adjustment of the mA and/or kV according to patient size and/or use of iterative reconstruction technique. CONTRAST:  75mL OMNIPAQUE IOHEXOL 350 MG/ML SOLN COMPARISON:  CT of the chest 04/01/2023 FINDINGS: Cardiovascular: Pulmonary arteries are adequately opacified. There is no evidence of acute pulmonary embolism. Central pulmonary arteries are normal in caliber. The thoracic aorta is also well opacified and demonstrates normal caliber without evidence of aneurysm, dissection or atherosclerosis. The heart size is at the upper limits of normal. No pericardial effusion identified. No significant calcified coronary artery plaque visualized. A right-sided PICC line is present with the catheter tip extending into the SVC. Mediastinum/Nodes: No enlarged mediastinal, hilar, or axillary lymph nodes. Thyroid gland, trachea, and esophagus demonstrate no significant findings. Gastric decompression tube extends into the stomach. Lungs/Pleura: Patchy pattern of bilateral parenchymal airspace disease with more upper lobe predominance, right greater than  left, but also involvement in the right middle lobe and lower lobes. This is felt to be most likely on the basis of pulmonary edema, although a component of pneumonia may be present, especially atypical pneumonia. There is an associated small left pleural effusion. No pneumothorax. Upper Abdomen: In the visualized upper abdomen there is a tiny focus of air along the left anterior abdominal wall likely related to recent surgery. Musculoskeletal: No chest wall abnormality. No acute or significant osseous findings. Review of the MIP images confirms the above findings. IMPRESSION: 1. No evidence of acute pulmonary embolism. 2. Patchy pattern of bilateral parenchymal airspace disease with more upper lobe predominance, right greater than left, but also involvement  in the right middle lobe and lower lobes. This is felt to be most likely on the basis of pulmonary edema, although a component of pneumonia may be present, especially atypical pneumonia. 3. Small left pleural effusion. 4. Tiny focus of air along the left anterior abdominal wall likely related to recent surgery. Electronically Signed   By: Irish Lack M.D.   On: 05/04/2023 13:43   CT ABDOMEN PELVIS WO CONTRAST  Result Date: 05/04/2023 CLINICAL DATA:  Sepsis, initial encounter EXAM: CT ABDOMEN AND PELVIS WITHOUT CONTRAST TECHNIQUE: Multidetector CT imaging of the abdomen and pelvis was performed following the standard protocol without IV contrast. RADIATION DOSE REDUCTION: This exam was performed according to the departmental dose-optimization program which includes automated exposure control, adjustment of the mA and/or kV according to patient size and/or use of iterative reconstruction technique. COMPARISON:  04/01/2023 FINDINGS: Lower chest: Left pleural effusion is again identified with left basilar consolidation. Patchy infiltrates are noted throughout both lungs consistent with multifocal pneumonia. Hepatobiliary: No focal liver abnormality is seen. No gallstones, gallbladder wall thickening, or biliary dilatation. Pancreas: Unremarkable. No pancreatic ductal dilatation or surrounding inflammatory changes. Spleen: Normal in size without focal abnormality. Adrenals/Urinary Tract: Adrenal glands are within normal limits. Kidneys are well visualize without renal calculi or obstructive change. No focal mass is seen. The bladder is decompressed by Foley catheter. Stomach/Bowel: Gastric catheter is noted within the stomach. Stomach is decompressed. No obstructive changes of the small bowel are seen. Postsurgical changes are noted within the small bowel anterior consistent with the given clinical history. Surgical drains are seen. Air is noted in the anterior abdominal wall as well as within the peritoneal  cavity consistent with the recent surgical history. Wide granulating wound is noted anteriorly. No obstructive changes are seen. No findings to suggest postoperative abscess are noted. Vascular/Lymphatic: No significant vascular findings are present. No enlarged abdominal or pelvic lymph nodes. Reproductive: Uterus and bilateral adnexa are unremarkable. Other: No abdominal wall hernia or abnormality. No abdominopelvic ascites. Musculoskeletal: No acute or significant osseous findings. IMPRESSION: Postoperative changes are noted with wide anterior abdominal wound and changes consistent with prior small bowel resection. Scattered foci of air are noted within the abdomen as well as within the anterior abdominal wall consistent with the recent surgical history. No discrete abscess is seen. Left effusion stable from the prior exam. Patchy airspace opacities throughout both lungs consistent with multifocal infiltrate. Electronically Signed   By: Alcide Clever M.D.   On: 05/04/2023 03:35   DG Chest 1 View  Result Date: 05/04/2023 CLINICAL DATA:  [id number] with ongoing fever. EXAM: CHEST  1 VIEW COMPARISON:  Portable chest 05/02/2023 3:01 p.m. FINDINGS: 24:28 a.m. NGT enters the stomach with the intragastric course not filmed. Right PICC tip remains at the superior cavoatrial junction. Stable moderate  cardiomegaly. There is central vascular prominence, today slightly improved. There is still interstitial consolidation in the lung fields but this is also mildly improved. Patchy hazy opacities in the right lung fields, left mid and lower lung are again noted and may suggest ground-glass edema, pneumonitis or combination. Small left pleural effusion appears similar. In all other respects no further changes. IMPRESSION: 1. Slightly improved central vascular prominence and mild improvement in interstitial consolidation/edema. 2. Patchy hazy opacities in the right lung fields, left mid and lower lung are again noted and may  suggest ground-glass edema, pneumonitis or combination. 3. Similar small left pleural effusion. Electronically Signed   By: Almira Bar M.D.   On: 05/04/2023 03:19   CT HEAD WO CONTRAST ( )  Result Date: 05/04/2023 CLINICAL DATA:  Mental status change, unknown cause EXAM: CT HEAD WITHOUT CONTRAST TECHNIQUE: Contiguous axial images were obtained from the base of the skull through the vertex without intravenous contrast. RADIATION DOSE REDUCTION: This exam was performed according to the departmental dose-optimization program which includes automated exposure control, adjustment of the mA and/or kV according to patient size and/or use of iterative reconstruction technique. COMPARISON:  07/14/2022 FINDINGS: Brain: No evidence of acute infarction, hemorrhage, mass, mass effect, or midline shift. No hydrocephalus or extra-axial fluid collection. Vascular: No hyperdense vessel. Skull: Negative for fracture or focal lesion. Sinuses/Orbits: Air-fluid level in the right sphenoid sinus. A nasogastric to is noted in the nasal cavity. No acute finding in the orbits. Other: Fluid in the bilateral mastoid air cells. IMPRESSION: No acute intracranial process. Electronically Signed   By: Wiliam Ke M.D.   On: 05/04/2023 03:16   DG Chest Port 1 View  Result Date: 05/02/2023 CLINICAL DATA:  Pneumonia.  Four days post abdominal surgery. EXAM: PORTABLE CHEST 1 VIEW COMPARISON:  Chest yesterday FINDINGS: Enteric tube tip below the diaphragm. Unchanged right subclavian/upper extremity central line with tip overlying the atrial caval junction. Persistent low lung volumes. Prominent heart size, stable. Retrocardiac opacity likely combination of pleural fluid and atelectasis/airspace disease. Central vascular congestion again seen. No pneumothorax. IMPRESSION: 1. Persistent low lung volumes. Retrocardiac opacity likely combination of pleural fluid and atelectasis/airspace disease. 2. Unchanged cardiomegaly and central  vascular congestion. Electronically Signed   By: Narda Rutherford M.D.   On: 05/02/2023 18:10             LOS: 446 days    Time spent: 50 min    Sunnie Nielsen, DO [facility] 05/05/2023, 10:30 AM    Dictation software may have been used to generate the above note. Typos may occur and escape review in typed/dictated notes. Please contact Dr Lyn Hollingshead directly for clarity if needed.  Staff may message me via secure chat in Epic  but this may not receive an immediate response,  please page me for urgent matters!  If 7PM-7AM, please contact night coverage [email or web address]

## 2023-05-05 NOTE — Plan of Care (Signed)
  Problem: Clinical Measurements: Goal: Will remain free from infection Outcome: Progressing Goal: Diagnostic test results will improve Outcome: Progressing Goal: Respiratory complications will improve Outcome: Progressing   Problem: Clinical Measurements: Goal: Will remain free from infection Outcome: Progressing   Problem: Coping: Goal: Level of anxiety will decrease Outcome: Progressing   Problem: Respiratory: Goal: Ability to maintain a clear airway and adequate ventilation will improve Outcome: Progressing

## 2023-05-06 ENCOUNTER — Inpatient Hospital Stay: Payer: Medicaid Other

## 2023-05-06 LAB — TYPE AND SCREEN
ABO/RH(D): O POS
Antibody Screen: NEGATIVE
Unit division: 0

## 2023-05-06 LAB — BASIC METABOLIC PANEL
Anion gap: 5 (ref 5–15)
BUN: 59 mg/dL — ABNORMAL HIGH (ref 6–20)
CO2: 27 mmol/L (ref 22–32)
Calcium: 7.8 mg/dL — ABNORMAL LOW (ref 8.9–10.3)
Chloride: 118 mmol/L — ABNORMAL HIGH (ref 98–111)
Creatinine, Ser: 1.28 mg/dL — ABNORMAL HIGH (ref 0.44–1.00)
GFR, Estimated: 48 mL/min — ABNORMAL LOW (ref >60)
Glucose, Bld: 180 mg/dL — ABNORMAL HIGH (ref 70–99)
Potassium: 3.8 mmol/L (ref 3.5–5.1)
Sodium: 150 mmol/L — ABNORMAL HIGH (ref 135–145)

## 2023-05-06 LAB — CBC
HCT: 30.9 % — ABNORMAL LOW (ref 36.0–46.0)
Hemoglobin: 10.1 g/dL — ABNORMAL LOW (ref 12.0–15.0)
MCH: 29.6 pg (ref 26.0–34.0)
MCHC: 32.7 g/dL (ref 30.0–36.0)
MCV: 90.6 fL (ref 80.0–100.0)
Platelets: 46 10*3/uL — ABNORMAL LOW (ref 150–400)
RBC: 3.41 MIL/uL — ABNORMAL LOW (ref 3.87–5.11)
RDW: 22.6 % — ABNORMAL HIGH (ref 11.5–15.5)
WBC: 5.8 10*3/uL (ref 4.0–10.5)
nRBC: 1 % — ABNORMAL HIGH (ref 0.0–0.2)

## 2023-05-06 LAB — BPAM RBC
Blood Product Expiration Date: 202409132359
ISSUE DATE / TIME: 202408271527
Unit Type and Rh: 5100

## 2023-05-06 LAB — GLUCOSE, CAPILLARY
Glucose-Capillary: 128 mg/dL — ABNORMAL HIGH (ref 70–99)
Glucose-Capillary: 140 mg/dL — ABNORMAL HIGH (ref 70–99)
Glucose-Capillary: 141 mg/dL — ABNORMAL HIGH (ref 70–99)
Glucose-Capillary: 144 mg/dL — ABNORMAL HIGH (ref 70–99)
Glucose-Capillary: 156 mg/dL — ABNORMAL HIGH (ref 70–99)
Glucose-Capillary: 157 mg/dL — ABNORMAL HIGH (ref 70–99)
Glucose-Capillary: 173 mg/dL — ABNORMAL HIGH (ref 70–99)
Glucose-Capillary: 89 mg/dL (ref 70–99)

## 2023-05-06 MED ORDER — ALBUMIN HUMAN 25 % IV SOLN
25.0000 g | Freq: Once | INTRAVENOUS | Status: AC
  Administered 2023-05-06: 25 g via INTRAVENOUS
  Filled 2023-05-06: qty 100

## 2023-05-06 MED ORDER — ZINC CHLORIDE 1 MG/ML IV SOLN
INTRAVENOUS | Status: AC
  Filled 2023-05-06: qty 707.2

## 2023-05-06 MED ORDER — ACETAMINOPHEN 10 MG/ML IV SOLN
1000.0000 mg | Freq: Four times a day (QID) | INTRAVENOUS | Status: AC
  Administered 2023-05-06 – 2023-05-07 (×4): 1000 mg via INTRAVENOUS
  Filled 2023-05-06 (×4): qty 100

## 2023-05-06 MED ORDER — IOHEXOL 300 MG/ML  SOLN
100.0000 mL | Freq: Once | INTRAMUSCULAR | Status: AC | PRN
  Administered 2023-05-06: 100 mL via INTRAVENOUS

## 2023-05-06 NOTE — Progress Notes (Signed)
Progress Note    Debra [name]  ZOX:[id number] DOB: Aug 29, 1966  DOA: 02/13/2022 PCP: Hillery Aldo, MD      Brief Narrative:    Medical records reviewed and are as summarized below:  Debra [name] is a 57 y.o. 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 with recurrent disease at anterior pelvis next to the anastomosis which was complicated by high output enterocutaneous fistula , and laparotomy excision of greater omental mass, abdominal wall reconstruction with [name], appendectomy and placement of Prevena vacuum on 06/08, complicated with small bowel perforation, underwent reopening of laparotomy and repair of small bowel perforation, high output enterocutaneous fistula, on chronic TPN since April this year, has had a prolonged hospital stay, complicated with right IJ Port-A-Cath Enterococcus infection which was removed and a new PICC line was placed recently.  Hospitalist team was asked to manage patient's hyperglycemia and coagulopathy.     Significant hospital events:  02/13/22: initial laparotomy, excision of greater omental mass, abdominal wall reconstruction with [name], appendectomy, and placement of Prevena vac  02/15/22: EXPLORATORY LAPAROTOMY REPAIR OF BOWEL PERFORATION, INSERTION OF MESH, APPLICATION OF WOUND VAC 02/26/22:  COLON RESECTION SIGMOID, HERNIA REPAIR VENTRAL, APPENDECTOMY, INSERTION OF MESH, REPAIR ABDOMINAL WALL 07/15/22: Neuro surgery consultation due to L1 compression fracture. Not a candidate for lumbosacral orthotic. 12/19/22: Insertion of Port-a-cath 04/06/23: removal of port-a-cath Throughout stay patient has required TPN, WOC & ID consultation.     Recent hospital events  04/28/23: 12 hours and surgery for abdominal wall reconstruction, recurrent incisional hernia repair, partial gastrectomy, small bowel resection, repair enterotomy x 3 and wound VAC  placement.  Patient extubated postop but too lethargic to maintain airway requiring reintubation and transferred to ICU for postoperative mechanical ventilatory management. 04/29/23: Pt remains mechanically intubated FiO2 40%/PEEP 5.  Pt awake and with purposeful movement on precedex gtt.  Will perform SBT with plans for possible extubation  04/30/23: hospitalist consulted for hyperglycemia and coagulopathy. Overnight fever to 101.8, tachycardic, WBC WNL but concern for SIRS/sepsis 08/23: pro calcitonin (+), starting tx for HCAP. MRSA resp screen recently neg 08/20, will get sputum cx. Echo EF 60-65%, G1DD 08/24: hypernatremic, inflammatory markers up (ferritin high, ALP high), still septic/SIRS, escalating abx, repeat CXR, very cautious fluid administration to help reduce sodium and if increased SOB would d/c fluids and repeat CXR, increased insulin, repeat albumin  08/25: Tachycardia has improved, still tachypnea but no longer meeting sepsis/sirs criteria. Sodium about same 152, Hgb stable. Relatively low UOsm 444 c/w osmotic diuresis (get Glc controlled) / loop diuretics (d/c Lasix) / ADH resistance (correct potassium though hypokalemia is not severe, protein malnutrition is most likely greater factor). Gentle hydration and will need to d/w dietary/pharmacy but malnutrition is going to be a persistent issue. Still substantial fluid loss via NG 08/26: overnight, increased confusion, temp 100.9-101.1, tachycardic, wound vac drainage increased, transferred back to stepdown, switch cefepime + flagyl to Zosyn, continue vanc, follow BCx, neuro-checks, given lasix, CT head no acute process, CT abd/pel no abscess abdominal and multifocal lung infiltrate, CTA chest to r/o PE / eval pneumonia/effusion --> no PE, (+)pulmonary edema. WIll hold fluids and try diuresis tonight, hopefully sodium and BP will hold, signed otu to night team in ICU in case needing pressors 08/27: stable this morning, sodium slightly better,  HH worse, will monitor closely, n oUOP documentd, follow  closely .        Assessment/Plan:  Principal Problem:   Enterocutaneous fistula Active Problems:   S/P hernia repair   Closed wedge compression fracture of first lumbar vertebra (HCC)   Recurrent ventral hernia   Arm swelling   Acute respiratory failure with hypoxia (HCC)   Acute metabolic encephalopathy   HCAP (healthcare-associated pneumonia)   Acute pulmonary edema (HCC)   Nutrition Problem: Increased nutrient needs Etiology: wound healing, catabolic illness  Signs/Symptoms: estimated needs   Body mass index is 48.09 kg/m.  (Morbid obesity)      Anasarca/Edema generalized  Hypoalbuminemia seems more likely than CHF but she does have G1 diastolic df  Lasix held because of AKI and hypernatremia S/p treatment with IV albumin 2D echo on 04/30/2023 showed EF estimated 6065%, grade 1 diastolic dysfunction  HCAP Meeting sepsis criteria overnight 08/22-08/24 w/ tachycardia and fever (+)procalcitonin She is on IV Zosyn    Hypernatremia Fluctuating sodium level. She is on TPN for nutrition.  Will hold off on IV fluids at this time. Monitor BMP closely  Hypokalemia Improved   Acute hypoxic respiratory failure  She is on 4 L/min oxygen.  Wean off oxygen as able.    AKI  Creatinine is trending down.  Continue to monitor   Prediabetes with hyperglycemia Most recent A1c 6.2 indicating a condition of prediabetes at baseline Hyperglycemia likely exacerbated by TPN Continue insulin glargine and NovoLog  Coagulopathy Mild, chronic, review patient's record showed she has a chronic elevation of INR, at baseline 1.2-1.4 and per patient granddaughter, patient has no known history of easy bleeding, no SLE history in the family.  CT abdomen showed no signs of liver cirrhosis or NASH. Getting vitamin K w/ TPN.  recommend checking INR periodically and before surgery    Chronic iron deficiency anemia D/t  malabsorption. Recent Iron WNL, TIBC low, ferritin high d/t inflammation H&H is stable    s/p exploratory laparotomy, extensive lysis of adhesion, 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  Follow-up with general surgeon   Diet Order             Diet NPO time specified Except for: Ice Chips, Sips with Meds  Diet effective now                             Medications:    sodium chloride   Intravenous Once   alteplase  2 mg Intracatheter Once   Chlorhexidine Gluconate Cloth  6 each Topical Daily   heparin injection (subcutaneous)  5,000 Units Subcutaneous Q8H   insulin aspart  0-20 Units Subcutaneous Q4H   insulin aspart  4 Units Subcutaneous Q4H   insulin glargine-yfgn  22 Units Subcutaneous Daily   levothyroxine  100 mcg Intravenous Daily   lidocaine  2 patch Transdermal Q24H   octreotide  100 mcg Subcutaneous Q8H   pantoprazole (PROTONIX) IV  40 mg Intravenous Q12H   pregabalin  200 mg Per Tube TID   sodium chloride flush  10-40 mL Intracatheter Q12H   Continuous Infusions:  methocarbamol (ROBAXIN) IV     piperacillin-tazobactam 12.5 mL/hr at 05/06/23 1429   TPN ADULT (ION) 65 mL/hr at 05/06/23 1429   TPN ADULT (ION)       Anti-infectives (From admission, onward)    Start     Dose/Rate Route Frequency Ordered Stop   05/05/23 0807  vancomycin variable dose per unstable renal function (pharmacist dosing)  Status:  Discontinued         Does not apply See admin instructions 05/05/23 0807 05/05/23 0823   05/04/23 1800  vancomycin (VANCOCIN) IVPB 1000 mg/200 mL premix  Status:  Discontinued        1,000 mg 200 mL/hr over 60 Minutes Intravenous Every 24 hours 05/04/23 0755 05/05/23 0807   05/04/23 0600  piperacillin-tazobactam (ZOSYN) IVPB 3.375 g        3.375 g 12.5 mL/hr over 240 Minutes Intravenous Every 8 hours 05/04/23 0236      05/03/23 1800  vancomycin (VANCOCIN) IVPB 1000 mg/200 mL premix  Status:  Discontinued        1,000 mg 200 mL/hr over 60 Minutes Intravenous Every 24 hours 05/02/23 1530 05/03/23 0823   05/03/23 1800  vancomycin (VANCOREADY) IVPB 1250 mg/250 mL  Status:  Discontinued        1,250 mg 166.7 mL/hr over 90 Minutes Intravenous Every 24 hours 05/03/23 0823 05/03/23 1752   05/03/23 1800  Vancomycin (VANCOCIN) 1,250 mg in sodium chloride 0.9 % 250 mL IVPB  Status:  Discontinued        1,250 mg 166.7 mL/hr over 90 Minutes Intravenous Every 24 hours 05/03/23 1752 05/04/23 0755   05/02/23 2000  ceFEPIme (MAXIPIME) 2 g in sodium chloride 0.9 % 100 mL IVPB  Status:  Discontinued        2 g 200 mL/hr over 30 Minutes Intravenous Every 8 hours 05/02/23 1530 05/04/23 0236   05/02/23 1630  vancomycin (VANCOREADY) IVPB 2000 mg/400 mL        2,000 mg 200 mL/hr over 120 Minutes Intravenous  Once 05/02/23 1530 05/02/23 1746   05/02/23 1600  metroNIDAZOLE (FLAGYL) IVPB 500 mg  Status:  Discontinued        500 mg 100 mL/hr over 60 Minutes Intravenous Every 12 hours 05/02/23 1453 05/04/23 0236   05/01/23 1400  azithromycin (ZITHROMAX) 500 mg in sodium chloride 0.9 % 250 mL IVPB  Status:  Discontinued        500 mg 250 mL/hr over 60 Minutes Intravenous Every 24 hours 05/01/23 1255 05/02/23 1453   05/01/23 1230  azithromycin (ZITHROMAX) tablet 500 mg  Status:  Discontinued        500 mg Oral Daily 05/01/23 1135 05/01/23 1255   05/01/23 1200  cefTRIAXone (ROCEPHIN) 2 g in sodium chloride 0.9 % 100 mL IVPB  Status:  Discontinued        2 g 200 mL/hr over 30 Minutes Intravenous Every 24 hours 05/01/23 1135 05/02/23 1453   04/28/23 2200  cefoTEtan (CEFOTAN) 2 g in sodium chloride 0.9 % 100 mL IVPB        2 g 200 mL/hr over 30 Minutes Intravenous Every 8 hours 04/28/23 2058 04/29/23 1717   04/27/23 0945  cefoTEtan (CEFOTAN) 2 g in sodium chloride 0.9 % 100 mL IVPB        2 g 200 mL/hr over 30 Minutes Intravenous On  call to O.R. 04/27/23 0856 04/28/23 0559   04/05/23 0900  cefTRIAXone (ROCEPHIN) 2 g in sodium chloride 0.9 % 100 mL IVPB        2 g 200 mL/hr over 30 Minutes Intravenous Every 24 hours 04/05/23 0634 04/14/23 1254   12/19/22 1000  ceFAZolin (ANCEF) IVPB 2g/100 mL premix        2 g 200 mL/hr over 30 Minutes Intravenous  Once 12/18/22 1738 12/19/22 1141   09/17/22 1200  vancomycin (VANCOREADY) IVPB 1250 mg/250 mL  Status:  Discontinued        1,250 mg 166.7 mL/hr over 90 Minutes Intravenous Every 24 hours 09/16/22 1040 09/22/22 1139   09/16/22 1200  vancomycin (VANCOREADY) IVPB 1250 mg/250 mL        1,250 mg 166.7 mL/hr over 90 Minutes Intravenous  Once 09/15/22 1706 09/16/22 1255   09/15/22 1200  ceFEPIme (MAXIPIME) 2 g in sodium chloride 0.9 % 100 mL IVPB  Status:  Discontinued        2 g 200 mL/hr over 30 Minutes Intravenous Every 8 hours 09/15/22 1024 09/18/22 1026   09/15/22 1200  vancomycin (VANCOREADY) IVPB 2000 mg/400 mL        2,000 mg 200 mL/hr over 120 Minutes Intravenous  Once 09/15/22 1024 09/15/22 1500   03/04/22 2200  piperacillin-tazobactam (ZOSYN) IVPB 3.375 g        3.375 g 12.5 mL/hr over 240 Minutes Intravenous Every 8 hours 03/04/22 2005 03/18/22 0248   02/21/22 1500  vancomycin (VANCOREADY) IVPB 1250 mg/250 mL  Status:  Discontinued        1,250 mg 166.7 mL/hr over 90 Minutes Intravenous Every 24 hours 02/21/22 1111 02/24/22 1336   02/21/22 1400  meropenem (MERREM) 1 g in sodium chloride 0.9 % 100 mL IVPB        1 g 200 mL/hr over 30 Minutes Intravenous Every 8 hours 02/21/22 1258 02/27/22 2157   02/20/22 1500  vancomycin (VANCOCIN) IVPB 1000 mg/200 mL premix  Status:  Discontinued        1,000 mg 200 mL/hr over 60 Minutes Intravenous Every 24 hours 02/19/22 1706 02/21/22 1111   02/19/22 1115  vancomycin (VANCOREADY) IVPB 2000 mg/400 mL        2,000 mg 200 mL/hr over 120 Minutes Intravenous  Once 02/19/22 1025 02/19/22 1732   02/19/22 1115  fluconazole (DIFLUCAN)  IVPB 400 mg        400 mg 100 mL/hr over 120 Minutes Intravenous Every 24 hours 02/19/22 1025 03/04/22 1301   02/15/22 0730  piperacillin-tazobactam (ZOSYN) IVPB 3.375 g  Status:  Discontinued        3.375 g 12.5 mL/hr over 240 Minutes Intravenous Every 8 hours 02/15/22 0639 02/21/22 1235   02/15/22 0655  piperacillin-tazobactam (ZOSYN) 3.375 GM/50ML IVPB       Note to Pharmacy: Loretha Stapler N: cabinet override      02/15/22 0655 02/15/22 0816   02/13/22 2200  cefoTEtan (CEFOTAN) 2 g in sodium chloride 0.9 % 100 mL IVPB  Status:  Discontinued        2 g 200 mL/hr over 30 Minutes Intravenous Every 12 hours 02/13/22 1541 02/15/22 0639   02/13/22 1345  cefoTEtan (CEFOTAN) 2 g in sodium chloride 0.9 % 100 mL IVPB        2 g 200 mL/hr over 30 Minutes Intravenous  Once 02/13/22 1331 02/13/22 2146   02/13/22 1336  sodium chloride 0.9 % with cefoTEtan (CEFOTAN) ADS Med       Note to Pharmacy: Gates Rigg: cabinet override      02/13/22 1336 02/14/22 0144   02/13/22 0620  sodium chloride 0.9 % with cefoTEtan (CEFOTAN) ADS Med       Note to Pharmacy: Agnes Lawrence A: cabinet override      02/13/22 0620 02/13/22 0802   02/13/22 0600  cefoTEtan (CEFOTAN) 2 g in sodium chloride 0.9 % 100 mL IVPB        2 g 200 mL/hr over 30 Minutes Intravenous On call to O.R.  02/12/22 2229 02/13/22 2146              Family Communication/Anticipated D/C date and plan/Code Status   DVT prophylaxis: heparin injection 5,000 Units Start: 05/04/23 1645 SCDs Start: 02/13/22 1542     Code Status: Full Code  Family Communication: Boyfriend at the bedside       Subjective:   Interval events noted.  She is confused and cannot provide any history.  Boyfriend at the bedside  Objective:    Vitals:   05/06/23 1200 05/06/23 1300 05/06/23 1400 05/06/23 1500  BP: (!) 95/56 (!) 97/59 109/63 103/62  Pulse: 65 67 74   Resp: 14 13 (!) 21 19  Temp:      TempSrc:      SpO2: 100% 100% 98%    Weight:      Height:       No data found.   Intake/Output Summary (Last 24 hours) at 05/06/2023 1651 Last data filed at 05/06/2023 1429 Gross per 24 hour  Intake 3092.56 ml  Output 2360 ml  Net 732.56 ml   Filed Weights   05/03/23 0405 05/05/23 0500 05/06/23 0500  Weight: 107.4 kg 103 kg 108 kg    Exam:  GEN: NAD, morbidly obese SKIN: Warm and dry EYES: No pallor or icterus ENT: MMM CV: RRR PULM: Diminished breath sounds.  No rales or wheezing heard ABD: soft, ND, NT, +BS, wound VAC connected to surgical abdominal wound CNS: Drowsy but arousable, confused EXT: Edema bilateral upper and lower extremities        Data Reviewed:   I have personally reviewed following labs and imaging studies:  Labs: Labs show the following:   Basic Metabolic Panel: Recent Labs  Lab 04/30/23 0401 05/01/23 0352 05/02/23 0442 05/03/23 0407 05/03/23 1454 05/03/23 2154 05/04/23 0325 05/04/23 1046 05/05/23 0730 05/06/23 0903  NA 142 144   < > 152*   < > 149* 149*  150* 150* 148* 150*  K 4.2 4.7   < > 3.2*  --  4.1 4.6  --  4.0 3.8  CL 110 110   < > 119*  --  118* 118*  --  119* 118*  CO2 26 26   < > 27  --  25 24  --  24 27  GLUCOSE 275* 286*   < > 226*  --  180* 121*  --  148* 180*  BUN 35* 49*   < > 36*  --  37* 44*  --  51* 59*  CREATININE 0.82 1.39*   < > 0.78  --  0.90 1.05*  --  1.38* 1.28*  CALCIUM 7.9* 7.8*   < > 8.3*  --  8.0* 7.9*  --  7.4* 7.8*  MG 2.6* 2.4  --  2.4  --   --  2.2  --   --   --   PHOS 2.8  --   --  2.2*  --   --  4.3  --   --   --    < > = values in this interval not displayed.   GFR Estimated Creatinine Clearance: 51.6 mL/min (A) (by C-G formula based on SCr of 1.28 mg/dL (H)). Liver Function Tests: Recent Labs  Lab 05/01/23 0352 05/02/23 0442 05/03/23 2154 05/04/23 0325  AST 52* 53* 72* 79*  ALT 49* 50* 74* 75*  ALKPHOS 117 167* 192* 194*  BILITOT 2.2* 2.4* 3.0* 3.2*  PROT 4.8* 4.8* 5.1* 4.9*  ALBUMIN 2.2* 2.1* 2.3* 2.1*  No  results for input(s): "LIPASE", "AMYLASE" in the last 168 hours. No results for input(s): "AMMONIA" in the last 168 hours. Coagulation profile Recent Labs  Lab 04/30/23 0508 05/01/23 0352 05/03/23 0407 05/03/23 2154  INR 1.6* 1.6* 1.5* 1.5*    CBC: Recent Labs  Lab 05/02/23 0442 05/03/23 0407 05/04/23 0325 05/05/23 0730 05/05/23 1957 05/06/23 0903  WBC 7.7 7.9 11.2* 5.9  --  5.8  NEUTROABS 5.6 5.6 8.3*  --   --   --   HGB 7.8* 7.8* 8.2* 7.5* 9.7* 10.1*  HCT 24.3* 23.8* 26.2* 24.9* 30.5* 30.9*  MCV 88.4 89.1 93.2 98.0  --  90.6  PLT 42* 46* 46* 45*  --  46*   Cardiac Enzymes: No results for input(s): "CKTOTAL", "CKMB", "CKMBINDEX", "TROPONINI" in the last 168 hours. BNP (last 3 results) No results for input(s): "PROBNP" in the last 8760 hours. CBG: Recent Labs  Lab 05/06/23 0005 05/06/23 0332 05/06/23 0811 05/06/23 1117 05/06/23 1613  GLUCAP 128* 140* 141* 157* 89   D-Dimer: Recent Labs    05/04/23 0325  DDIMER 2.95*   Hgb A1c: No results for input(s): "HGBA1C" in the last 72 hours. Lipid Profile: Recent Labs    05/04/23 0325  TRIG 147   Thyroid function studies: No results for input(s): "TSH", "T4TOTAL", "T3FREE", "THYROIDAB" in the last 72 hours.  Invalid input(s): "FREET3" Anemia work up: No results for input(s): "VITAMINB12", "FOLATE", "FERRITIN", "TIBC", "IRON", "RETICCTPCT" in the last 72 hours. Sepsis Labs: Recent Labs  Lab 05/01/23 0352 05/02/23 0442 05/03/23 0407 05/04/23 0325 05/05/23 0730 05/06/23 0903  PROCALCITON 5.51  --  2.10  --   --   --   WBC 8.6   < > 7.9 11.2* 5.9 5.8   < > = values in this interval not displayed.    Microbiology Recent Results (from the past 240 hour(s))  MRSA Next Gen by PCR, Nasal     Status: None   Collection Time: 04/28/23  8:33 PM   Specimen: Nasal Mucosa; Nasal Swab  Result Value Ref Range Status   MRSA by PCR Next Gen NOT DETECTED NOT DETECTED Final    Comment: (NOTE) The GeneXpert MRSA  Assay (FDA approved for NASAL specimens only), is one component of a comprehensive MRSA colonization surveillance program. It is not intended to diagnose MRSA infection nor to guide or monitor treatment for MRSA infections. Test performance is not FDA approved in patients less than 101 years old. Performed at [facility], [address] [place], [place] [address]   Culture, blood (Routine X 2) w Reflex to ID Panel     Status: None (Preliminary result)   Collection Time: 05/04/23  3:59 AM   Specimen: BLOOD LEFT ARM  Result Value Ref Range Status   Specimen Description BLOOD LEFT ARM  Final   Special Requests   Final    BOTTLES DRAWN AEROBIC AND ANAEROBIC Blood Culture results may not be optimal due to an inadequate volume of blood received in culture bottles   Culture   Final    NO GROWTH 2 DAYS Performed at [facility], [address], [place], [place] [address]    Report Status PENDING  Incomplete  Culture, blood (Routine X 2) w Reflex to ID Panel     Status: None (Preliminary result)   Collection Time: 05/04/23  3:59 AM   Specimen: BLOOD LEFT HAND  Result Value Ref Range Status   Specimen Description BLOOD LEFT HAND  Final  Special Requests   Final    BOTTLES DRAWN AEROBIC ONLY Blood Culture results may not be optimal due to an inadequate volume of blood received in culture bottles   Culture   Final    NO GROWTH 2 DAYS Performed at [facility], [address] [place], [place] [address]    Report Status PENDING  Incomplete    Procedures and diagnostic studies:  CT ABDOMEN PELVIS W CONTRAST  Result Date: 05/06/2023 CLINICAL DATA:  Abdominal pain, status post resection of enterocutaneous fistula, lysis of adhesions, and abdominal wall reconstruction EXAM: CT ABDOMEN AND PELVIS WITH CONTRAST TECHNIQUE: Multidetector CT imaging of the abdomen and pelvis was performed using the standard protocol following bolus administration of intravenous  contrast. RADIATION DOSE REDUCTION: This exam was performed according to the departmental dose-optimization program which includes automated exposure control, adjustment of the mA and/or kV according to patient size and/or use of iterative reconstruction technique. CONTRAST:  OMNIPAQUE IOHEXOL 300 MG/ML  SOLN COMPARISON:  05/04/2023 FINDINGS: Lower chest: There are progressive areas of bibasilar consolidation and enlarging bilateral pleural effusions. Increasing left lower lobe atelectasis. Hepatobiliary: No focal liver abnormality is seen. No gallstones, gallbladder wall thickening, or biliary dilatation. Pancreas: Unremarkable. No pancreatic ductal dilatation or surrounding inflammatory changes. Spleen: Normal in size without focal abnormality. Adrenals/Urinary Tract: The kidneys enhance normally. Interval development of mild right-sided hydronephrosis and hydroureter, without clear cause for obstruction identified. No radiopaque urinary tract calculi. The adrenals and bladder are unremarkable. Stomach/Bowel: Enteric catheter tip within the gastric lumen. No evidence of bowel obstruction or ileus. Postsurgical changes are seen from multiple bowel resection and reanastomosis within the lower anterior abdomen and upper pelvis. Proximal colon demonstrates nonspecific bowel wall thickening given decompressed state. Since the prior exam, higher attenuation material can be seen within the anterior abdominal wall surgical site. There is loss of normal fat plane between multiple loops of bowel and the lower anterior abdominal wall, just inferior to the reconstruction site, reference axial image 64/71 of series 2. Underlying enterocutaneous fistula in this region cannot be excluded. Vascular/Lymphatic: No significant vascular findings are present. No enlarged abdominal or pelvic lymph nodes. Reproductive: Uterus and bilateral adnexa are unremarkable. Other: Trace free fluid within the bilateral flanks consistent with  recent surgical intervention. Punctate foci of free intra-abdominal gas are seen within the upper abdomen, consistent with recent surgical intervention as well as an indwelling surgical drain extending across the lower mid abdomen. Postsurgical changes from reconstruction of the anterior abdominal wall after wide resection, with no evidence of hernia. Musculoskeletal: Subcutaneous drain is identified within the lower anterior abdominal wall. Intraperitoneal drain extends from the left flank crossed to the right mid abdomen. No acute or destructive bony abnormalities. Reconstructed images demonstrate no additional findings. IMPRESSION: 1. Interval development of high attenuation material throughout the anterior abdominal wall at the site of prior resection and reconstruction. There is loss of normal fat planes between multiple loops of bowel in the lower upper pelvis and the inferior anterior abdominal wall, and enterocutaneous fistula dislocation cannot be excluded. 2. Mild wall thickening of the proximal colon, nonspecific in the decompressed state and recent surgery. No bowel obstruction or ileus. 3. Progressive bilateral airspace disease and enlarging bilateral effusions, which could reflect asymmetric edema or infection. 4. Trace free fluid within the abdomen and pelvis. No fluid collection or abscess. Electronically Signed   By: Sharlet Salina M.D.   On: 05/06/2023 15:05  LOS: 447 days   Sandrea Boer  [employer] loss adjuster on [email or web address]. If 7PM-7AM, please contact night-coverage at [email or web address]     05/06/2023, 4:51 PM

## 2023-05-06 NOTE — Progress Notes (Signed)
PHARMACY - TOTAL PARENTERAL NUTRITION CONSULT NOTE   Indication: Prolonged ileus  Patient Measurements: Height: 4\' 11"  (149.9 cm) Weight: 108 kg (238 lb 1.6 oz) IBW/kg (Calculated) : 43.2 TPN AdjBW (KG): 59 Body mass index is 48.09 kg/m.  Assessment: Debra [name] is a 57 y.o. female s/p laparotomy, excision of greater omental mass, abdominal wall reconstruction with [name], appendectomy, and placement of Prevena vac.  Glucose / Insulin:  BG last 24h: 128 - 173 SSI last 24h: 23u rSSI q4h + Novolog 4u q4h + Semglee 22u daily + insulin removed from TPN 8/26 Electrolytes: Hypernatremia, hyperchloremia Renal: SCr 1.28 (baseline ~ 0.5) Hepatic: Mild, stable transaminitis. 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, partial gastrectomy, abdominal wall reconstruction with mesh placement, and wound vac placement   Central access: PICC 04/09/2023   TPN start date: 04/09/23 TPN from 02/15/2022 to 04/05/2023 - held for bacteremia and TPN restarted 04/09/23 Previously Cyclic TPN, transitioned back to regular TPN on 04/29/23 post surgery  RD Assessment:  Estimated Needs Total Energy Estimated Needs: 1900-2200kcal/day Total Protein Estimated Needs: 95-110g/day Total Fluid Estimated Needs: 1.4-1.6L/day  Current Nutrition: NPO  Plan:  --Continue TPN at new goal rate of 65 mL/hr at 1800 --Electrolytes in TPN (standard): Na 50 mEq/L, K  73mEq/L, Ca 56mEq/L, Mg 12mEq/L, and Phos 49mmol/L --Modify to Cl:Ac 1:1 --Continue multivitamin, trace elements, zinc and chromium chloride to TPN --Discussed with MD and will remove insulin from TPN bag. MD will manage glucose via  insulin regimen --Monitor TPN labs daily until stable then on Mon/Thurs  Debra [name] 05/06/2023 9:35 AM

## 2023-05-06 NOTE — Progress Notes (Signed)
Occupational Therapy Treatment Patient Details Name: Debra [name] MRN: [id number] DOB: 08/07/1966 Today's Date: 05/06/2023   History of present illness 57 y.o. female s/p laparotomy, excision of greater omental mass, abdominal wall reconstruction with [name], appendectomy, and placement of Prevena vac.   OT comments  Chart reviewed, pt greeted in bed. In person hospital provided interpreter provided throughout. Boyfriend is present throughout. Tx session targeting improving cognition, positioning, mobility in prep for out of bed tasks. Pt is initially not responding to noxious stim, eventually progressed to following simple commands with fair accuracy with position changes and boyfriend talking to patient. Pt continues to require TOTAL A +1-2 for ADL tasks, mobility. Pt is making progress towards goals, OT will continue to follow.       If plan is discharge home, recommend the following:  Assistance with cooking/housework;Direct supervision/assist for medications management;Assist for transportation;Help with stairs or ramp for entrance;Two people to help with walking and/or transfers;Two people to help with bathing/dressing/bathroom;Direct supervision/assist for financial management   Equipment Recommendations  Other (comment) (per next venue of care)    Recommendations for Other Services      Precautions / Restrictions Precautions Precautions: Fall Precaution Comments: wound vac Restrictions Weight Bearing Restrictions: No       Mobility Bed Mobility Overal bed mobility: Needs Assistance             General bed mobility comments: TOTAL A +2 with any bed mobility attempts; TOTAL A for moving BLE in bed for repositioning    Transfers                         Balance                                           ADL either performed or assessed with clinical judgement   ADL Overall ADL's : Needs assistance/impaired      Grooming: Wash/dry face;Maximal assistance Grooming Details (indicate cue type and reason): hand over hand assist, frequent multi modal cues             Lower Body Dressing: Total assistance                      Extremity/Trunk Assessment Upper Extremity Assessment Upper Extremity Assessment: Difficult to assess due to impaired cognition;LUE deficits/detail RUE Deficits / Details: pt with weak/absent grip strength, bring elbow towards mouth approx 1/4 full AROM LUE Deficits / Details: pt with weak/absent grip strength, bring elbow towards mouth approx 1/4 full AROM            Vision       Perception     Praxis      Cognition Arousal: Lethargic Behavior During Therapy: Flat affect Overall Cognitive Status: Impaired/Different from baseline Area of Impairment: Orientation, Attention, Memory, Safety/judgement, Awareness, Following commands, Problem solving                 Orientation Level: Disoriented to, Place, Time, Situation Current Attention Level: Focused Memory: Decreased short-term memory Following Commands: Follows one step commands inconsistently Safety/Judgement: Decreased awareness of safety, Decreased awareness of deficits Awareness: Intellectual Problem Solving: Slow processing, Decreased initiation, Difficulty sequencing, Requires verbal cues, Requires tactile cues General Comments: Pt initially not opening eyes to noxious stim, eventually opened eyes and visually tracking after position changes, after approx 10 minutes  pt opening eyes and interacting with boyfriend, following simple commands with fair accuracy (initially poor accuracy)        Exercises Other Exercises Other Exercises: Significant time to ilicit sustained attention with fair-poor performance towards end of session Other Exercises: Educated pt and boyfriend re: positioning, movement in bed, delerium prevention Other Exercises: massage, positioning provided to B hands/ BUE to  decrease edema    Shoulder Instructions       General Comments      Pertinent Vitals/ Pain       Pain Assessment Pain Assessment: No/denies pain  Home Living                                          Prior Functioning/Environment              Frequency  Min 1X/week        Progress Toward Goals  OT Goals(current goals can now be found in the care plan section)  Progress towards OT goals: Progressing toward goals     Plan Discharge plan remains appropriate;Frequency remains appropriate    Co-evaluation    PT/OT/SLP Co-Evaluation/Treatment: Yes Reason for Co-Treatment: Complexity of the patient's impairments (multi-system involvement);Necessary to address cognition/behavior during functional activity;For patient/therapist safety PT goals addressed during session: Strengthening/ROM OT goals addressed during session: ADL's and self-care      AM-PAC OT "6 Clicks" Daily Activity     Outcome Measure   Help from another person eating meals?: Total Help from another person taking care of personal grooming?: Total Help from another person toileting, which includes using toliet, bedpan, or urinal?: Total Help from another person bathing (including washing, rinsing, drying)?: Total Help from another person to put on and taking off regular upper body clothing?: Total Help from another person to put on and taking off regular lower body clothing?: Total 6 Click Score: 6    End of Session Equipment Utilized During Treatment: Oxygen  OT Visit Diagnosis: Unsteadiness on feet (R26.81);Muscle weakness (generalized) (M62.81)   Activity Tolerance Patient limited by lethargy   Patient Left in bed;with call bell/phone within reach;with bed alarm set;with family/visitor present   Nurse Communication Mobility status        Time: 1610-9604 OT Time Calculation (min): 32 min  Charges: OT General Charges $OT Visit: 1 Visit OT Treatments $Therapeutic  Activity: 8-22 mins  Oleta Mouse, OTD OTR/L  05/06/23, 1:15 PM

## 2023-05-06 NOTE — Progress Notes (Signed)
Physical Therapy Treatment Patient Details Name: Debra [name] MRN: [id number] DOB: 09-Aug-1966 Today's Date: 05/06/2023   History of Present Illness 57 y.o. female s/p laparotomy, excision of greater omental mass, abdominal wall reconstruction with [name], appendectomy, and placement of Prevena vac.    PT Comments  Patient in bed on arrival. In person interpreter present and boyfriend in room and supportive. Session focused on improving alertness, cognition, positioning, and initiating mobility. Placed patient in chair position to elicit increased alertness, however initially not responsive to noxious stimuli but progressed to following simple commands with fair accuracy. Requiring totalA+2 for all mobility. Able to wiggle toes with max stimulation. Discharge plan remains appropriate. PT will continue to follow.     If plan is discharge home, recommend the following: A lot of help with walking and/or transfers;A lot of help with bathing/dressing/bathroom;Assistance with cooking/housework;Direct supervision/assist for medications management;Direct supervision/assist for financial management;Assist for transportation;Help with stairs or ramp for entrance;Assistance with feeding   Can travel by private vehicle     No  Equipment Recommendations  Other (comment) (TBD at next venue of care)    Recommendations for Other Services       Precautions / Restrictions Precautions Precautions: Fall Precaution Comments: wound vac Restrictions Weight Bearing Restrictions: No     Mobility  Bed Mobility Overal bed mobility: Needs Assistance             General bed mobility comments: TOTAL A +2 with any bed mobility attempts; TOTAL A for moving BLE in bed for repositioning    Transfers                        Ambulation/Gait                   Stairs             Wheelchair Mobility     Tilt Bed    Modified Rankin (Stroke Patients Only)        Balance                                            Cognition Arousal: Lethargic Behavior During Therapy: Flat affect Overall Cognitive Status: Impaired/Different from baseline Area of Impairment: Orientation, Attention, Memory, Safety/judgement, Awareness, Following commands, Problem solving                 Orientation Level: Disoriented to, Place, Time, Situation Current Attention Level: Focused Memory: Decreased short-term memory Following Commands: Follows one step commands inconsistently Safety/Judgement: Decreased awareness of safety, Decreased awareness of deficits Awareness: Intellectual Problem Solving: Slow processing, Decreased initiation, Difficulty sequencing, Requires verbal cues, Requires tactile cues General Comments: Pt initially not opening eyes to noxious stim, eventually opened eyes and visually tracking after position changes, after approx 10 minutes pt opening eyes and interacting with boyfriend, following simple commands with fair accuracy (initially poor accuracy)        Exercises      General Comments        Pertinent Vitals/Pain Pain Assessment Pain Assessment: No/denies pain    Home Living                          Prior Function            PT Goals (current goals can now be found  in the care plan section) Acute Rehab PT Goals PT Goal Formulation: With patient/family Time For Goal Achievement: 05/14/23 Potential to Achieve Goals: Fair Progress towards PT goals: Not progressing toward goals - comment    Frequency    Min 1X/week      PT Plan Current plan remains appropriate    Co-evaluation PT/OT/SLP Co-Evaluation/Treatment: Yes Reason for Co-Treatment: Complexity of the patient's impairments (multi-system involvement);Necessary to address cognition/behavior during functional activity;For patient/therapist safety PT goals addressed during session: Strengthening/ROM OT goals addressed during  session: ADL's and self-care      AM-PAC PT "6 Clicks" Mobility   Outcome Measure  Help needed turning from your back to your side while in a flat bed without using bedrails?: Total Help needed moving from lying on your back to sitting on the side of a flat bed without using bedrails?: Total Help needed moving to and from a bed to a chair (including a wheelchair)?: Total Help needed standing up from a chair using your arms (e.g., wheelchair or bedside chair)?: Total Help needed to walk in hospital room?: Total Help needed climbing 3-5 steps with a railing? : Total 6 Click Score: 6    End of Session Equipment Utilized During Treatment: Oxygen Activity Tolerance: Treatment limited secondary to medical complications (Comment) Patient left: in bed;with call bell/phone within reach;with family/visitor present Nurse Communication: Mobility status PT Visit Diagnosis: Muscle weakness (generalized) (M62.81);Other abnormalities of gait and mobility (R26.89)     Time: 1610-9604 PT Time Calculation (min) (ACUTE ONLY): 32 min  Charges:    $Therapeutic Activity: 8-22 mins PT General Charges $$ ACUTE PT VISIT: 1 Visit                     Maylon Peppers, PT, DPT Physical Therapist - [facility]  [place]  Cynthis Purington A Juda Lajeunesse 05/06/2023, 1:14 PM

## 2023-05-06 NOTE — Progress Notes (Signed)
[facility] SUR[facility]ESS NOTE  Hospital Day(s): 447.   Post op day(s): 8 Days Post-Op.   Interval History:  Patient seen and examined No acute events or new complaints overnight.  She is sleeping this morning; does not arouse to verbal stimuli Pending labs this morning  NGT in place; clamped Surgical drains as follow:   - LLQ (abdominal wall); 23 ccs; serous  - RLQ (Intra-abdominal); 290 ccs; serosanguinous; not overtly bilious or succus: ? Fat necrosis Wound vac in place; 1000 ccs; this is also borderline in quality, not overtly bilious, ? Fat necrosis Continues on Zosyn NPO + TPN   Vital signs in last 24 hours: [min-max] current  Temp:  [97.6 F (36.4 C)-99.8 F (37.7 C)] 98.3 F (36.8 C) (08/28 0400) Pulse Rate:  [66-74] 70 (08/28 0700) Resp:  [14-24] 17 (08/28 0700) BP: (84-126)/(48-59) 89/51 (08/28 0700) SpO2:  [94 %-100 %] 98 % (08/28 0700) Weight:  [295 kg] 108 kg (08/28 0500)     Height: 4\' 11"  (149.9 cm) Weight: 108 kg BMI (Calculated): 48.06   Intake/Output last 2 shifts:  08/27 0701 - 08/28 0700 In: 1691.2 [I.V.:741.1; Blood:360; NG/GT:30; IV Piggyback:560.2] Out: 2363 [Urine:1050; Drains:1313]   Physical Exam:  Constitutional: Resting in bed; NAD HEENT: NGT in place; clamped  Respiratory: breathing non-labored at rest; on Anthony Cardiovascular: regular rate and sinus rhythm  Gastrointestinal: soft, unable to assess tenderness, and non-distended. LLQ drain (abdominal wall); serous output. RLQ drain (intra-abdominal); output is serosanguinous vs fat necrosis; no overtly bilious Integumentary: Wound vac to midline wound with sutures inferiorly and superiorly; good seal, output currently is more serosanguinous   Labs:     Latest Ref Rng & Units 05/05/2023    7:57 PM 05/05/2023    7:30 AM 05/04/2023    3:25 AM  CBC  WBC 4.0 - 10.5 K/uL  5.9  11.2   Hemoglobin 12.0 - 15.0 g/dL 9.7  7.5  8.2   Hematocrit 36.0 - 46.0 % 30.5  24.9  26.2    Platelets 150 - 400 K/uL  45  46       Latest Ref Rng & Units 05/05/2023    7:30 AM 05/04/2023   10:46 AM 05/04/2023    3:25 AM  CMP  Glucose 70 - 99 mg/dL 621   308   BUN 6 - 20 mg/dL 51   44   Creatinine 6.57 - 1.00 mg/dL 8.46   9.62   Sodium 952 - 145 mmol/L 148  150  150    149   Potassium 3.5 - 5.1 mmol/L 4.0   4.6   Chloride 98 - 111 mmol/L 119   118   CO2 22 - 32 mmol/L 24   24   Calcium 8.9 - 10.3 mg/dL 7.4   7.9   Total Protein 6.5 - 8.1 g/dL   4.9   Total Bilirubin 0.3 - 1.2 mg/dL   3.2   Alkaline Phos 38 - 126 U/L   194   AST 15 - 41 U/L   79   ALT 0 - 44 U/L   75     Imaging studies: No new pertinent imaging studies   Assessment/Plan:  57 y.o. 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    - Still remains question of possible fistula vs leak although not clear cut; output in  drains and wound vac may be fat necrosis, there are fat lobules in this, and it is not overtly bilious. However, we will continue to manage this as such out of extreme caution.   - Wound vac changed this AM by bedside RN - I will plan to change at bedside tomorrow morning to reassess wound, this will also allow for next change to be 09/02  - Follow up BMP this AM; If sCr improved, will get CT abdomen/Pelvis with PO/IV contrast today - Continue NPO; TPN at goal - Continue Octreotide - Continue Abx (Zosyn) - Continue NGT; okay to leave clamped  - Continue surgical drains x2; monitor and record output  - Monitor abdominal examination  - Pain control prn; antiemetics prn  - Appreciate medicine assistance   -- [name], PA-C [facility] 05/06/2023, 7:51 AM M-F: 7am - 4pm

## 2023-05-06 NOTE — Consult Note (Signed)
WOC contacted bedside nursing. Changed NPWT dressing this am due to leaking alarm. Surgery aware they will re-evaluate wound and change NPWT dressing again in the am.  Supplies in the room (ok to start black foam) with next dressing change per Dr. Everlene Farrier 8/27.  Surgery to let WOC nursing know about continuation of NPWT dressing vs. On hold or other needs. Aware no WOC nursing on the [facility] campus Friday.   Rital Cavey [facility], CNS, [employer] [phone]

## 2023-05-06 NOTE — Plan of Care (Signed)
  Problem: Clinical Measurements: Goal: Respiratory complications will improve Outcome: Progressing   Problem: Clinical Measurements: Goal: Will remain free from infection Outcome: Progressing   Problem: Activity: Goal: Risk for activity intolerance will decrease Outcome: Progressing   Problem: Coping: Goal: Level of anxiety will decrease Outcome: Progressing   Problem: Pain Managment: Goal: General experience of comfort will improve Outcome: Progressing   Problem: Metabolic: Goal: Ability to maintain appropriate glucose levels will improve Outcome: Progressing

## 2023-05-07 LAB — BASIC METABOLIC PANEL
Anion gap: 9 (ref 5–15)
BUN: 62 mg/dL — ABNORMAL HIGH (ref 6–20)
CO2: 26 mmol/L (ref 22–32)
Calcium: 7.6 mg/dL — ABNORMAL LOW (ref 8.9–10.3)
Chloride: 112 mmol/L — ABNORMAL HIGH (ref 98–111)
Creatinine, Ser: 1.31 mg/dL — ABNORMAL HIGH (ref 0.44–1.00)
GFR, Estimated: 47 mL/min — ABNORMAL LOW (ref >60)
Glucose, Bld: 135 mg/dL — ABNORMAL HIGH (ref 70–99)
Potassium: 4 mmol/L (ref 3.5–5.1)
Sodium: 147 mmol/L — ABNORMAL HIGH (ref 135–145)

## 2023-05-07 LAB — CBC
HCT: 28.8 % — ABNORMAL LOW (ref 36.0–46.0)
Hemoglobin: 9 g/dL — ABNORMAL LOW (ref 12.0–15.0)
MCH: 29.4 pg (ref 26.0–34.0)
MCHC: 31.3 g/dL (ref 30.0–36.0)
MCV: 94.1 fL (ref 80.0–100.0)
Platelets: 47 10*3/uL — ABNORMAL LOW (ref 150–400)
RBC: 3.06 MIL/uL — ABNORMAL LOW (ref 3.87–5.11)
RDW: 22.8 % — ABNORMAL HIGH (ref 11.5–15.5)
WBC: 6.5 10*3/uL (ref 4.0–10.5)
nRBC: 0.5 % — ABNORMAL HIGH (ref 0.0–0.2)

## 2023-05-07 LAB — GLUCOSE, CAPILLARY
Glucose-Capillary: 117 mg/dL — ABNORMAL HIGH (ref 70–99)
Glucose-Capillary: 112 mg/dL — ABNORMAL HIGH (ref 70–99)
Glucose-Capillary: 136 mg/dL — ABNORMAL HIGH (ref 70–99)
Glucose-Capillary: 134 mg/dL — ABNORMAL HIGH (ref 70–99)
Glucose-Capillary: 120 mg/dL — ABNORMAL HIGH (ref 70–99)

## 2023-05-07 MED ORDER — ZINC CHLORIDE 1 MG/ML IV SOLN
INTRAVENOUS | Status: AC
  Filled 2023-05-07: qty 707.2

## 2023-05-07 MED ORDER — SODIUM CHLORIDE 0.9 % IV SOLN
3.0000 g | Freq: Four times a day (QID) | INTRAVENOUS | Status: DC
  Administered 2023-05-08 – 2023-05-14 (×27): 3 g via INTRAVENOUS
  Filled 2023-05-07 (×27): qty 8

## 2023-05-07 NOTE — Consult Note (Signed)
WOC Nurse Consult Note: Reason for Consult: placement of [name] pouch Patient has NPWT dressing, new onset of feculent succus in VAC canister. To DC NPWT and begin management of wound with [name] pouch Wound type: Surgical wound with mesh, see previous notes Necrosis at subcutaneous wound edges, progressive since dressing change. Pressure Injury POA: NA Measurement: 16cmx 12cm x 2cm with undermining circumferentially and brown liquid effluent mainly around left lateral aspect of wound edge  Wound ZOX:WRUEAVW with mesh  Drainage (amount, consistency, odor) see above  Periwound: intact sutures in place  Dressing procedure/placement/frequency: Cut pattern for wound/large [name] without window Lined wound edge with [name](material cut from center of pouch) cut into strips to line the edges of the wound bed. Cover sutured area distally with extra piece of [name] material Placed red rubber catheter in to the spout (cut 3 extra openings into the catheter), and secure with waterproof tape.  Placed to LWS (continuous)   Staff at bedside for application. Debra [name] at bedside as well. Spanish interpreter used to communicate with family in the room on condition of patient and change in treatment plan.   Debra [name] [facility], CNS, [employer] [phone]

## 2023-05-07 NOTE — Progress Notes (Addendum)
[place] [facility]ESS [facility]  Hospital Day(s): 448.   Post op day(s): 9 Days Post-Op.   Interval History:  Patient seen and examined No acute events or new complaints overnight.  Patient resting this AM She remains without leukocytosis; 6.5K Hgb to 9.0 Hypernatremia to 147 sCr - 1.31; UO - 1085 NGT in place; clamped Surgical drains as follow:   - LLQ (abdominal wall); 23 ccs; serous  - RLQ (Intra-abdominal); 380 ccs Wound vac in place; 1425 ccs; this is thin serous fluid although I do think this has more of a bile tinge to it; sort of light brown  Continues on Zosyn NPO + TPN   Vital signs in last 24 hours: [min-max] current  Temp:  [98.3 F (36.8 C)-98.7 F (37.1 C)] 98.6 F (37 C) (08/29 0400) Pulse Rate:  [65-86] 86 (08/29 0600) Resp:  [10-24] 10 (08/29 0600) BP: (88-135)/(43-105) 102/62 (08/29 0600) SpO2:  [95 %-100 %] 97 % (08/29 0600) Weight:  [108.3 kg] 108.3 kg (08/29 0420)     Height: 4\' 11"  (149.9 cm) Weight: 108.3 kg BMI (Calculated): 48.2   Intake/Output last 2 shifts:  08/28 0701 - 08/29 0700 In: 3184.4 [P.O.:120; I.V.:1542.7; NG/GT:1030; IV Piggyback:491.6] Out: 3113 [Urine:1285; Drains:1828]   Physical Exam:  Constitutional: Resting in bed; NAD HEENT: NGT in place; clamped Respiratory: breathing non-labored at rest; on  Cardiovascular: regular rate and sinus rhythm  Gastrointestinal: soft, and non-distended. LLQ drain (abdominal wall); serous output. RLQ drain (intra-abdominal) Integumentary: Wound vac to midline wound with sutures inferiorly and superiorly; good seal, output currently is serous vs light brown fluid, I do think this may be more consistent with developing fistula or leak   Midline Wound (05/07/2023):    Labs:     Latest Ref Rng & Units 05/07/2023    4:14 AM 05/06/2023    9:03 AM 05/05/2023    7:57 PM  CBC  WBC 4.0 - 10.5 K/uL 6.5  5.8    Hemoglobin 12.0 - 15.0 g/dL 9.0  16.1  9.7   Hematocrit 36.0 - 46.0  % 28.8  30.9  30.5   Platelets 150 - 400 K/uL 47  46        Latest Ref Rng & Units 05/07/2023    4:14 AM 05/06/2023    9:03 AM 05/05/2023    7:30 AM  CMP  Glucose 70 - 99 mg/dL 096  045  409   BUN 6 - 20 mg/dL 62  59  51   Creatinine 0.44 - 1.00 mg/dL 8.11  9.14  7.82   Sodium 135 - 145 mmol/L 147  150  148   Potassium 3.5 - 5.1 mmol/L 4.0  3.8  4.0   Chloride 98 - 111 mmol/L 112  118  119   CO2 22 - 32 mmol/L 26  27  24    Calcium 8.9 - 10.3 mg/dL 7.6  7.8  7.4     Imaging studies: No new pertinent imaging studies   Assessment/Plan:  57 y.o. 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    - Unfortunately, wound vac output remains significantly high, now with thin light-brown color to it. Also noted contrast extravasation on CT yesterday 08/28. I do worry this is developing fistula or controlled leak. I do think at this time, we need to discontinue wound vac and transition back to  wound manager ([name]) with red rubber suction. We have done this this morning.  - Continue [name] daily; red rubber to suction; do not place in wound bed itself - Greatly appreciate WOC assistance   - Continue NPO; TPN at goal - monitor hypernatremia  - Continue Octreotide  - Continue Abx (Zosyn) - Will switch to Unasyn and continue for another 7 days - Continue NGT; okay to leave clamped  - Continue surgical drains x2; monitor and record output  - Monitor abdominal examination  - Pain control prn; antiemetics prn  - Appreciate medicine assistance   Spanish-English interpretor utilized to update family at bedside. Patient very somnolent   -- [name], PA-C [facility] 05/07/2023, 7:29 AM M-F: 7am - 4pm

## 2023-05-07 NOTE — Progress Notes (Signed)
PHARMACY - TOTAL PARENTERAL NUTRITION CONSULT NOTE   Indication: Prolonged ileus  Patient Measurements: Height: 4\' 11"  (149.9 cm) Weight: 108.3 kg (238 lb 12.1 oz) IBW/kg (Calculated) : 43.2 TPN AdjBW (KG): 59 Body mass index is 48.22 kg/m.  Assessment: Debra [name] is a 57 y.o. female s/p laparotomy, excision of greater omental mass, abdominal wall reconstruction with [name], appendectomy, and placement of Prevena vac.  Glucose / Insulin:  BG last 24h: 89 - 157 SSI last 24h: 14u rSSI q4h + Novolog 4u q4h + Semglee 22u daily + insulin removed from TPN 8/26 Electrolytes: Hypernatremia, hyperchloremia Renal: SCr 1.31 (baseline ~ 0.5) Hepatic: Mild, stable transaminitis. 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, partial gastrectomy, abdominal wall reconstruction with mesh placement, and wound vac placement   Central access: PICC 04/09/2023   TPN start date: 04/09/23 TPN from 02/15/2022 to 04/05/2023 - held for bacteremia and TPN restarted 04/09/23 Previously Cyclic TPN, transitioned back to regular TPN on 04/29/23 post surgery  RD Assessment:  Estimated Needs Total Energy Estimated Needs: 1900-2200kcal/day Total Protein Estimated Needs: 95-110g/day Total Fluid Estimated Needs: 1.4-1.6L/day  Current Nutrition: NPO  Plan:  --Continue TPN at new goal rate of 65 mL/hr at 1800 --Electrolytes in TPN (standard): Na 50 mEq/L, K  25mEq/L, Ca 19mEq/L, Mg 62mEq/L, and Phos 72mmol/L --Modify to Cl:Ac 1:1 --Continue multivitamin, trace elements, zinc, selenium and chromium chloride to TPN --Discussed with MD and will remove insulin from TPN bag. MD will manage glucose via  insulin regimen --Monitor TPN labs daily until stable then on Mon/Thurs  [name] 05/07/2023 7:45 AM

## 2023-05-07 NOTE — Progress Notes (Signed)
Physical Therapy Treatment Patient Details Name: Debra [name] MRN: [id number] DOB: [birth year] Today's Date: 05/07/2023   History of Present Illness 57 y.o. female s/p laparotomy, excision of greater omental mass, abdominal wall reconstruction with [name], appendectomy, and placement of Prevena vac.    PT Comments  PT/OT co-treat however limited due to pt lethargy and inability to stay awake to fully participate, assisted with rolling for hygiene care due to bed linens being soiled form leaking abdominal wound. Acute PT will continue to follow. Will require +2-3 assistance going forward until improvements are made. DC recs remain appropriate.    If plan is discharge home, recommend the following: Two people to help with walking and/or transfers;Two people to help with bathing/dressing/bathroom;Direct supervision/assist for financial management;Assist for transportation;Assistance with feeding;Assistance with cooking/housework;Direct supervision/assist for medications management;Help with stairs or ramp for entrance;Supervision due to cognitive status     Equipment Recommendations  Other (comment) (TBD)       Precautions / Restrictions Precautions Precautions: Fall Precaution Comments: eakin pouch Restrictions Weight Bearing Restrictions: No     Mobility  Bed Mobility Overal bed mobility: Needs Assistance Bed Mobility: Rolling Rolling: +2 for physical assistance, +2 for safety/equipment, Total assist  General bed mobility comments: pt unable to particpate due to lethargy.    Transfers    General transfer comment: Currently unable        Cognition Arousal: Lethargic Behavior During Therapy: Flat affect Overall Cognitive Status: Impaired/Different from baseline   General Comments: Pt with minimal eye opening to noxious stim, Visual focus on targets in the room with approx 25% accuracy, decreased alertness/participation as compared to yesteday. Of note ,pt  has had dilauded 2x today           General Comments General comments (skin integrity, edema, etc.): ng tube,  IV, leads all intact pre/post sessoin, [name] pouch leaking pre sessoin, assisted with bed change/pt clean up to faciliate improved alertness        PT Goals (current goals can now be found in the care plan section) Progress towards PT goals: Progressing toward goals    Frequency    Min 1X/week      PT Plan Current plan remains appropriate    Co-evaluation   Reason for Co-Treatment: Complexity of the patient's impairments (multi-system involvement);Necessary to address cognition/behavior during functional activity;For patient/therapist safety PT goals addressed during session: Mobility/safety with mobility;Balance;Proper use of DME;Strengthening/ROM OT goals addressed during session: ADL's and self-care      AM-PAC PT "6 Clicks" Mobility   Outcome Measure  Help needed turning from your back to your side while in a flat bed without using bedrails?: Total Help needed moving from lying on your back to sitting on the side of a flat bed without using bedrails?: Total Help needed moving to and from a bed to a chair (including a wheelchair)?: Total Help needed standing up from a chair using your arms (e.g., wheelchair or bedside chair)?: Total Help needed to walk in hospital room?: Total Help needed climbing 3-5 steps with a railing? : Total 6 Click Score: 6    End of Session Equipment Utilized During Treatment: Oxygen Activity Tolerance: Patient limited by lethargy;Treatment limited secondary to medical complications (Comment) Patient left: in bed;with call bell/phone within reach;with family/visitor present Nurse Communication: Mobility status PT Visit Diagnosis: Muscle weakness (generalized) (M62.81);Other abnormalities of gait and mobility (R26.89) Pain - Right/Left: Right Pain - part of body: Hip     Time: 3086-5784 PT Time Calculation (min) (  ACUTE ONLY):  34 min  Charges:    $Therapeutic Activity: 8-22 mins PT General Charges $$ ACUTE PT VISIT: 1 Visit                    Jetta Lout PTA 05/07/23, 4:39 PM

## 2023-05-07 NOTE — Progress Notes (Signed)
Occupational Therapy Treatment Patient Details Name: Debra [name] MRN: [id number] DOB: June 18, 1966 Today's Date: 05/07/2023   History of present illness 57 y.o. female s/p laparotomy, excision of greater omental mass, abdominal wall reconstruction with [name], appendectomy, and placement of Prevena vac.   OT comments  Chart reviewed, pt greeted in bed with boyfriend present. Tele interpreter via ipad utilized throughout. Co tx completed with PT. Pt is lethargic, difficult to arouse despite noxious stim. Pt groaning with position changes to change soiled bed. TOTAL A +1-2 required for all ADL/mobility. Boyfriend reports pt was awake at night talking to him about her children. At this time, current level of awareness, cognition is impacting participations. OT will continue to follow.       If plan is discharge home, recommend the following:  Assistance with cooking/housework;Direct supervision/assist for medications management;Assist for transportation;Help with stairs or ramp for entrance;Two people to help with walking and/or transfers;Two people to help with bathing/dressing/bathroom;Direct supervision/assist for financial management   Equipment Recommendations  Other (comment) (per next venue of care)    Recommendations for Other Services      Precautions / Restrictions Precautions Precautions: Fall Precaution Comments: eakin pouch Restrictions Weight Bearing Restrictions: No       Mobility Bed Mobility Overal bed mobility: Needs Assistance Bed Mobility: Rolling Rolling: +2 for physical assistance, +2 for safety/equipment, Total assist              Transfers                         Balance                                           ADL either performed or assessed with clinical judgement   ADL Overall ADL's : Needs assistance/impaired                                       General ADL Comments: TOTAL  A for grooming, TOTAL A +2 for UB dressing, multi modal cueing;    Extremity/Trunk Assessment              Vision       Perception     Praxis      Cognition Arousal: Lethargic Behavior During Therapy: Flat affect Overall Cognitive Status: Impaired/Different from baseline Area of Impairment: Orientation, Attention, Memory, Safety/judgement, Awareness, Following commands, Problem solving                               General Comments: Pt with minimal eye opening to noxious stim, Visual focus on targets in the room with approx 25% accuracy, decreased alertness/participation as compared to yesteday. Of note ,pt has had dilauded 2x today        Exercises      Shoulder Instructions       General Comments ng tube,  IV, leads all intact pre/post sessoin, [name] pouch leaking pre session, nurse in room to address, assisted with bed change/pt clean up to faciliate improved alertness with minimal change     Pertinent Vitals/ Pain       Pain Assessment Pain Assessment: CPOT Facial Expression: Relaxed, neutral Body Movements: Absence of movements Muscle Tension: Relaxed  Compliance with ventilator (intubated pts.): N/A Vocalization (extubated pts.): Sighing, moaning CPOT Total: 1 Pain Location: generalized with bed mobility, grimacing, moaning Pain Intervention(s): Monitored during session, Premedicated before session, Limited activity within patient's tolerance  Home Living                                          Prior Functioning/Environment              Frequency  Min 1X/week        Progress Toward Goals  OT Goals(current goals can now be found in the care plan section)  Progress towards OT goals: Not progressing toward goals - comment (limited by cognition, will continue to assess)     Plan Discharge plan remains appropriate;Frequency remains appropriate    Co-evaluation    PT/OT/SLP Co-Evaluation/Treatment: Yes Reason  for Co-Treatment: Complexity of the patient's impairments (multi-system involvement);Necessary to address cognition/behavior during functional activity;For patient/therapist safety   OT goals addressed during session: ADL's and self-care      AM-PAC OT "6 Clicks" Daily Activity     Outcome Measure   Help from another person eating meals?: Total Help from another person taking care of personal grooming?: Total Help from another person toileting, which includes using toliet, bedpan, or urinal?: Total Help from another person bathing (including washing, rinsing, drying)?: Total Help from another person to put on and taking off regular upper body clothing?: Total Help from another person to put on and taking off regular lower body clothing?: Total 6 Click Score: 6    End of Session Equipment Utilized During Treatment: Oxygen  OT Visit Diagnosis: Unsteadiness on feet (R26.81);Muscle weakness (generalized) (M62.81)   Activity Tolerance Patient limited by lethargy   Patient Left in bed;with call bell/phone within reach;with family/visitor present   Nurse Communication Mobility status        Time: 1610-9604 OT Time Calculation (min): 34 min  Charges: OT General Charges $OT Visit: 1 Visit OT Treatments $Therapeutic Activity: 8-22 mins  Oleta Mouse, OTD OTR/L  05/07/23, 4:16 PM

## 2023-05-07 NOTE — TOC Progression Note (Signed)
Transition of Care [facility]) - Progression Note    Patient Details  Name: Debra [name] MRN: [id number] Date of Birth: [birth year]  [facility] CM/SW Contact  Kreg Shropshire, RN Phone Number: 05/07/2023, 4:32 PM  Clinical Narrative:    Cm sent email to Francesca Oman, patient access supervisor, Admitting registration to verify pt Medicaid status. Cm unsure if Medicaid is still valid. This will help with d/c planning at this time. Cm awaiting to hear back from Rob.    Expected Discharge Plan: Home w Home Health Services Barriers to Discharge: Continued Medical Work up  Expected Discharge Plan and Services                                               Social Determinants of Health (SDOH) Interventions SDOH Screenings   Food Insecurity: No Food Insecurity (05/20/2022)  Housing: Low Risk  (05/20/2022)  Transportation Needs: No Transportation Needs (05/20/2022)  Utilities: Not At Risk (05/20/2022)  Tobacco Use: Low Risk  (05/19/2022)    Readmission Risk Interventions     No data to display

## 2023-05-07 NOTE — Progress Notes (Addendum)
Progress Note    Debra [name]  WUJ:[id number] DOB: 09/05/1966  DOA: 02/13/2022 PCP: Hillery Aldo, MD      Brief Narrative:    Medical records reviewed and are as summarized below:  Debra [name] is a 57 y.o. 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 with recurrent disease at anterior pelvis next to the anastomosis which was complicated by high output enterocutaneous fistula , and laparotomy excision of greater omental mass, abdominal wall reconstruction with [name], appendectomy and placement of Prevena vacuum on 06/08, complicated with small bowel perforation, underwent reopening of laparotomy and repair of small bowel perforation, high output enterocutaneous fistula, on chronic TPN since April this year, has had a prolonged hospital stay, complicated with right IJ Port-A-Cath Enterococcus infection which was removed and a new PICC line was placed recently.  Hospitalist team was asked to manage patient's hyperglycemia and coagulopathy.     Significant hospital events:  02/13/22: initial laparotomy, excision of greater omental mass, abdominal wall reconstruction with [name], appendectomy, and placement of Prevena vac  02/15/22: EXPLORATORY LAPAROTOMY REPAIR OF BOWEL PERFORATION, INSERTION OF MESH, APPLICATION OF WOUND VAC 02/26/22:  COLON RESECTION SIGMOID, HERNIA REPAIR VENTRAL, APPENDECTOMY, INSERTION OF MESH, REPAIR ABDOMINAL WALL 07/15/22: Neuro surgery consultation due to L1 compression fracture. Not a candidate for lumbosacral orthotic. 12/19/22: Insertion of Port-a-cath 04/06/23: removal of port-a-cath Throughout stay patient has required TPN, WOC & ID consultation.     Recent hospital events  04/28/23: 12 hours and surgery for abdominal wall reconstruction, recurrent incisional hernia repair, partial gastrectomy, small bowel resection, repair enterotomy x 3 and wound VAC  placement.  Patient extubated postop but too lethargic to maintain airway requiring reintubation and transferred to ICU for postoperative mechanical ventilatory management. 04/29/23: Pt remains mechanically intubated FiO2 40%/PEEP 5.  Pt awake and with purposeful movement on precedex gtt.  Will perform SBT with plans for possible extubation  04/30/23: hospitalist consulted for hyperglycemia and coagulopathy. Overnight fever to 101.8, tachycardic, WBC WNL but concern for SIRS/sepsis 08/23: pro calcitonin (+), starting tx for HCAP. MRSA resp screen recently neg 08/20, will get sputum cx. Echo EF 60-65%, G1DD 08/24: hypernatremic, inflammatory markers up (ferritin high, ALP high), still septic/SIRS, escalating abx, repeat CXR, very cautious fluid administration to help reduce sodium and if increased SOB would d/c fluids and repeat CXR, increased insulin, repeat albumin  08/25: Tachycardia has improved, still tachypnea but no longer meeting sepsis/sirs criteria. Sodium about same 152, Hgb stable. Relatively low UOsm 444 c/w osmotic diuresis (get Glc controlled) / loop diuretics (d/c Lasix) / ADH resistance (correct potassium though hypokalemia is not severe, protein malnutrition is most likely greater factor). Gentle hydration and will need to d/w dietary/pharmacy but malnutrition is going to be a persistent issue. Still substantial fluid loss via NG 08/26: overnight, increased confusion, temp 100.9-101.1, tachycardic, wound vac drainage increased, transferred back to stepdown, switch cefepime + flagyl to Zosyn, continue vanc, follow BCx, neuro-checks, given lasix, CT head no acute process, CT abd/pel no abscess abdominal and multifocal lung infiltrate, CTA chest to r/o PE / eval pneumonia/effusion --> no PE, (+)pulmonary edema. WIll hold fluids and try diuresis tonight, hopefully sodium and BP will hold, signed otu to night team in ICU in case needing pressors 08/27: stable this morning, sodium slightly better,  HH worse, will monitor closely, n oUOP documentd, follow  closely .        Assessment/Plan:  Principal Problem:   Enterocutaneous fistula Active Problems:   S/P hernia repair   Closed wedge compression fracture of first lumbar vertebra (HCC)   Recurrent ventral hernia   Arm swelling   Acute respiratory failure with hypoxia (HCC)   Acute metabolic encephalopathy   HCAP (healthcare-associated pneumonia)   Acute pulmonary edema (HCC)   Nutrition Problem: Increased nutrient needs Etiology: wound healing, catabolic illness  Signs/Symptoms: estimated needs   Body mass index is 48.22 kg/m.  (Morbid obesity)      Anasarca/Edema generalized  Hypoalbuminemia seems more likely than CHF but she does have G1 diastolic df  Lasix held because of AKI and hypernatremia S/p treatment with IV albumin 2D echo on 04/30/2023 showed EF estimated 6065%, grade 1 diastolic dysfunction   HCAP Meeting sepsis criteria overnight 08/22-08/24 w/ tachycardia and fever (+)procalcitonin 7 days of antibiotics is adequate for treatment of pneumonia. Dr. Everlene Farrier, surgeon, wants to continue antibiotics for now because of recent abdominal surgery and concern for developing fistula and repeat CT abdomen pelvis from 05/06/2023.Marland Kitchen    Hypernatremia Fluctuating sodium level. She is on TPN for nutrition.  Will hold off on IV fluids at this time. Monitor BMP closely   Hypokalemia Improved    Acute hypoxic respiratory failure  She is on 4 L/min oxygen.  Wean off oxygen as able.    AKI  Creatinine slightly up from 1.28-1.31.  Continue to monitor Avoid nephrotoxins.   Acute metabolic encephalopathy She is lethargic.  Continue supportive care   Prediabetes with hyperglycemia Most recent A1c 6.2 indicating a condition of prediabetes at baseline Hyperglycemia likely exacerbated by TPN Continue insulin glargine and NovoLog  Coagulopathy Mild, chronic, review patient's record showed she has a  chronic elevation of INR, at baseline 1.2-1.4 and per patient granddaughter, patient has no known history of easy bleeding, no SLE history in the family.  CT abdomen showed no signs of liver cirrhosis or NASH. Getting vitamin K w/ TPN.  recommend checking INR periodically and before surgery     Chronic iron deficiency anemia D/t malabsorption. Recent Iron WNL, TIBC low, ferritin high d/t inflammation H&H is stable   Thrombocytopenia It appears patient has chronic thrombocytopenia.  Monitor platelet count    s/p exploratory laparotomy, extensive lysis of adhesion, 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  Follow-up with general surgeon   Diet Order             Diet NPO time specified Except for: Ice Chips, Sips with Meds  Diet effective now                             Medications:    sodium chloride   Intravenous Once   alteplase  2 mg Intracatheter Once   Chlorhexidine Gluconate Cloth  6 each Topical Daily   heparin injection (subcutaneous)  5,000 Units Subcutaneous Q8H   insulin aspart  0-20 Units Subcutaneous Q4H   insulin aspart  4 Units Subcutaneous Q4H   insulin glargine-yfgn  22 Units Subcutaneous Daily   levothyroxine  100 mcg Intravenous Daily   lidocaine  2 patch Transdermal Q24H   octreotide  100 mcg Subcutaneous Q8H   pantoprazole (PROTONIX) IV  40 mg Intravenous Q12H   pregabalin  200 mg Per Tube TID   sodium chloride flush  10-40 mL Intracatheter Q12H   Continuous Infusions:  [START ON 05/08/2023]  ampicillin-sulbactam (UNASYN) IV     methocarbamol (ROBAXIN) IV     piperacillin-tazobactam 3.375 g (05/07/23 1414)   TPN ADULT (ION) 65 mL/hr at 05/07/23 1226   TPN ADULT (ION)       Anti-infectives (From admission, onward)    Start     Dose/Rate Route Frequency Ordered Stop   05/08/23 0600  Ampicillin-Sulbactam (UNASYN) 3  g in sodium chloride 0.9 % 100 mL IVPB        3 g 200 mL/hr over 30 Minutes Intravenous Every 6 hours 05/07/23 0949 05/14/23 2359   05/05/23 0807  vancomycin variable dose per unstable renal function (pharmacist dosing)  Status:  Discontinued         Does not apply See admin instructions 05/05/23 0807 05/05/23 0823   05/04/23 1800  vancomycin (VANCOCIN) IVPB 1000 mg/200 mL premix  Status:  Discontinued        1,000 mg 200 mL/hr over 60 Minutes Intravenous Every 24 hours 05/04/23 0755 05/05/23 0807   05/04/23 0600  piperacillin-tazobactam (ZOSYN) IVPB 3.375 g        3.375 g 12.5 mL/hr over 240 Minutes Intravenous Every 8 hours 05/04/23 0236 05/07/23 2359   05/03/23 1800  vancomycin (VANCOCIN) IVPB 1000 mg/200 mL premix  Status:  Discontinued        1,000 mg 200 mL/hr over 60 Minutes Intravenous Every 24 hours 05/02/23 1530 05/03/23 0823   05/03/23 1800  vancomycin (VANCOREADY) IVPB 1250 mg/250 mL  Status:  Discontinued        1,250 mg 166.7 mL/hr over 90 Minutes Intravenous Every 24 hours 05/03/23 0823 05/03/23 1752   05/03/23 1800  Vancomycin (VANCOCIN) 1,250 mg in sodium chloride 0.9 % 250 mL IVPB  Status:  Discontinued        1,250 mg 166.7 mL/hr over 90 Minutes Intravenous Every 24 hours 05/03/23 1752 05/04/23 0755   05/02/23 2000  ceFEPIme (MAXIPIME) 2 g in sodium chloride 0.9 % 100 mL IVPB  Status:  Discontinued        2 g 200 mL/hr over 30 Minutes Intravenous Every 8 hours 05/02/23 1530 05/04/23 0236   05/02/23 1630  vancomycin (VANCOREADY) IVPB 2000 mg/400 mL        2,000 mg 200 mL/hr over 120 Minutes Intravenous  Once 05/02/23 1530 05/02/23 1746   05/02/23 1600  metroNIDAZOLE (FLAGYL) IVPB 500 mg  Status:  Discontinued        500 mg 100 mL/hr over 60 Minutes Intravenous Every 12 hours 05/02/23 1453 05/04/23 0236   05/01/23 1400  azithromycin (ZITHROMAX) 500 mg in sodium chloride 0.9 % 250 mL IVPB  Status:  Discontinued        500 mg 250 mL/hr over 60 Minutes Intravenous Every  24 hours 05/01/23 1255 05/02/23 1453   05/01/23 1230  azithromycin (ZITHROMAX) tablet 500 mg  Status:  Discontinued        500 mg Oral Daily 05/01/23 1135 05/01/23 1255   05/01/23 1200  cefTRIAXone (ROCEPHIN) 2 g in sodium chloride 0.9 % 100 mL IVPB  Status:  Discontinued        2 g 200 mL/hr over 30 Minutes Intravenous Every 24 hours 05/01/23 1135 05/02/23 1453   04/28/23 2200  cefoTEtan (CEFOTAN) 2 g in sodium chloride 0.9 % 100 mL IVPB        2 g 200 mL/hr over 30 Minutes Intravenous Every 8 hours 04/28/23 2058 04/29/23 1717   04/27/23 0945  cefoTEtan (CEFOTAN) 2 g in sodium chloride 0.9 %  100 mL IVPB        2 g 200 mL/hr over 30 Minutes Intravenous On call to O.R. 04/27/23 0856 04/28/23 0559   04/05/23 0900  cefTRIAXone (ROCEPHIN) 2 g in sodium chloride 0.9 % 100 mL IVPB        2 g 200 mL/hr over 30 Minutes Intravenous Every 24 hours 04/05/23 0634 04/14/23 1254   12/19/22 1000  ceFAZolin (ANCEF) IVPB 2g/100 mL premix        2 g 200 mL/hr over 30 Minutes Intravenous  Once 12/18/22 1738 12/19/22 1141   09/17/22 1200  vancomycin (VANCOREADY) IVPB 1250 mg/250 mL  Status:  Discontinued        1,250 mg 166.7 mL/hr over 90 Minutes Intravenous Every 24 hours 09/16/22 1040 09/22/22 1139   09/16/22 1200  vancomycin (VANCOREADY) IVPB 1250 mg/250 mL        1,250 mg 166.7 mL/hr over 90 Minutes Intravenous  Once 09/15/22 1706 09/16/22 1255   09/15/22 1200  ceFEPIme (MAXIPIME) 2 g in sodium chloride 0.9 % 100 mL IVPB  Status:  Discontinued        2 g 200 mL/hr over 30 Minutes Intravenous Every 8 hours 09/15/22 1024 09/18/22 1026   09/15/22 1200  vancomycin (VANCOREADY) IVPB 2000 mg/400 mL        2,000 mg 200 mL/hr over 120 Minutes Intravenous  Once 09/15/22 1024 09/15/22 1500   03/04/22 2200  piperacillin-tazobactam (ZOSYN) IVPB 3.375 g        3.375 g 12.5 mL/hr over 240 Minutes Intravenous Every 8 hours 03/04/22 2005 03/18/22 0248   02/21/22 1500  vancomycin (VANCOREADY) IVPB 1250 mg/250 mL   Status:  Discontinued        1,250 mg 166.7 mL/hr over 90 Minutes Intravenous Every 24 hours 02/21/22 1111 02/24/22 1336   02/21/22 1400  meropenem (MERREM) 1 g in sodium chloride 0.9 % 100 mL IVPB        1 g 200 mL/hr over 30 Minutes Intravenous Every 8 hours 02/21/22 1258 02/27/22 2157   02/20/22 1500  vancomycin (VANCOCIN) IVPB 1000 mg/200 mL premix  Status:  Discontinued        1,000 mg 200 mL/hr over 60 Minutes Intravenous Every 24 hours 02/19/22 1706 02/21/22 1111   02/19/22 1115  vancomycin (VANCOREADY) IVPB 2000 mg/400 mL        2,000 mg 200 mL/hr over 120 Minutes Intravenous  Once 02/19/22 1025 02/19/22 1732   02/19/22 1115  fluconazole (DIFLUCAN) IVPB 400 mg        400 mg 100 mL/hr over 120 Minutes Intravenous Every 24 hours 02/19/22 1025 03/04/22 1301   02/15/22 0730  piperacillin-tazobactam (ZOSYN) IVPB 3.375 g  Status:  Discontinued        3.375 g 12.5 mL/hr over 240 Minutes Intravenous Every 8 hours 02/15/22 0639 02/21/22 1235   02/15/22 0655  piperacillin-tazobactam (ZOSYN) 3.375 GM/50ML IVPB       Note to Pharmacy: Loretha Stapler N: cabinet override      02/15/22 0655 02/15/22 0816   02/13/22 2200  cefoTEtan (CEFOTAN) 2 g in sodium chloride 0.9 % 100 mL IVPB  Status:  Discontinued        2 g 200 mL/hr over 30 Minutes Intravenous Every 12 hours 02/13/22 1541 02/15/22 0639   02/13/22 1345  cefoTEtan (CEFOTAN) 2 g in sodium chloride 0.9 % 100 mL IVPB        2 g 200 mL/hr over 30 Minutes Intravenous  Once 02/13/22 1331  02/13/22 2146   02/13/22 1336  sodium chloride 0.9 % with cefoTEtan (CEFOTAN) ADS Med       Note to Pharmacy: Gates Rigg: cabinet override      02/13/22 1336 02/14/22 0144   02/13/22 0620  sodium chloride 0.9 % with cefoTEtan (CEFOTAN) ADS Med       Note to Pharmacy: Agnes Lawrence A: cabinet override      02/13/22 0620 02/13/22 0802   02/13/22 0600  cefoTEtan (CEFOTAN) 2 g in sodium chloride 0.9 % 100 mL IVPB        2 g 200 mL/hr over 30 Minutes  Intravenous On call to O.R. 02/12/22 2229 02/13/22 2146              Family Communication/Anticipated D/C date and plan/Code Status   DVT prophylaxis: heparin injection 5,000 Units Start: 05/04/23 1645 SCDs Start: 02/13/22 1542     Code Status: Full Code  Family Communication: Boyfriend at the bedside       Subjective:   Interval events noted.  Patient is lethargic and unable to provide any history.  Her boyfriend was at the bedside.  Objective:    Vitals:   05/07/23 1000 05/07/23 1100 05/07/23 1200 05/07/23 1300  BP: (!) 98/58 (!) 101/54 (!) 92/53 (!) 91/55  Pulse: 84 90 88 86  Resp: 14 19 (!) 22 19  Temp:      TempSrc:      SpO2: 99% 97% 99% 99%  Weight:      Height:       No data found.   Intake/Output Summary (Last 24 hours) at 05/07/2023 1601 Last data filed at 05/07/2023 1226 Gross per 24 hour  Intake 2152.67 ml  Output 2395 ml  Net -242.33 ml   Filed Weights   05/05/23 0500 05/06/23 0500 05/07/23 0420  Weight: 103 kg 108 kg 108.3 kg    Exam:  GEN: NAD SKIN: Warm and dry EYES: No pallor or icterus ENT: MMM CV: RRR PULM: CTA B ABD: soft, obese, NT, +BS, + wound VAC connected to surgical abdominal wound CNS: Lethargic and difficult to arouse EXT: Swelling of bilateral upper and lower extremities      Data Reviewed:   I have personally reviewed following labs and imaging studies:  Labs: Labs show the following:   Basic Metabolic Panel: Recent Labs  Lab 05/01/23 0352 05/02/23 0442 05/03/23 0407 05/03/23 1454 05/03/23 2154 05/04/23 0325 05/04/23 1046 05/05/23 0730 05/06/23 0903 05/07/23 0414  NA 144   < > 152*   < > 149* 149*  150* 150* 148* 150* 147*  K 4.7   < > 3.2*  --  4.1 4.6  --  4.0 3.8 4.0  CL 110   < > 119*  --  118* 118*  --  119* 118* 112*  CO2 26   < > 27  --  25 24  --  24 27 26   GLUCOSE 286*   < > 226*  --  180* 121*  --  148* 180* 135*  BUN 49*   < > 36*  --  37* 44*  --  51* 59* 62*  CREATININE 1.39*    < > 0.78  --  0.90 1.05*  --  1.38* 1.28* 1.31*  CALCIUM 7.8*   < > 8.3*  --  8.0* 7.9*  --  7.4* 7.8* 7.6*  MG 2.4  --  2.4  --   --  2.2  --   --   --   --  PHOS  --   --  2.2*  --   --  4.3  --   --   --   --    < > = values in this interval not displayed.   GFR Estimated Creatinine Clearance: 50.5 mL/min (A) (by C-G formula based on SCr of 1.31 mg/dL (H)). Liver Function Tests: Recent Labs  Lab 05/01/23 0352 05/02/23 0442 05/03/23 2154 05/04/23 0325  AST 52* 53* 72* 79*  ALT 49* 50* 74* 75*  ALKPHOS 117 167* 192* 194*  BILITOT 2.2* 2.4* 3.0* 3.2*  PROT 4.8* 4.8* 5.1* 4.9*  ALBUMIN 2.2* 2.1* 2.3* 2.1*   No results for input(s): "LIPASE", "AMYLASE" in the last 168 hours. No results for input(s): "AMMONIA" in the last 168 hours. Coagulation profile Recent Labs  Lab 05/01/23 0352 05/03/23 0407 05/03/23 2154  INR 1.6* 1.5* 1.5*    CBC: Recent Labs  Lab 05/02/23 0442 05/03/23 0407 05/04/23 0325 05/05/23 0730 05/05/23 1957 05/06/23 0903 05/07/23 0414  WBC 7.7 7.9 11.2* 5.9  --  5.8 6.5  NEUTROABS 5.6 5.6 8.3*  --   --   --   --   HGB 7.8* 7.8* 8.2* 7.5* 9.7* 10.1* 9.0*  HCT 24.3* 23.8* 26.2* 24.9* 30.5* 30.9* 28.8*  MCV 88.4 89.1 93.2 98.0  --  90.6 94.1  PLT 42* 46* 46* 45*  --  46* 47*   Cardiac Enzymes: No results for input(s): "CKTOTAL", "CKMB", "CKMBINDEX", "TROPONINI" in the last 168 hours. BNP (last 3 results) No results for input(s): "PROBNP" in the last 8760 hours. CBG: Recent Labs  Lab 05/06/23 2012 05/06/23 2358 05/07/23 0347 05/07/23 0746 05/07/23 1135  GLUCAP 156* 144* 117* 112* 136*   D-Dimer: No results for input(s): "DDIMER" in the last 72 hours.  Hgb A1c: No results for input(s): "HGBA1C" in the last 72 hours. Lipid Profile: No results for input(s): "CHOL", "HDL", "LDLCALC", "TRIG", "CHOLHDL", "LDLDIRECT" in the last 72 hours.  Thyroid function studies: No results for input(s): "TSH", "T4TOTAL", "T3FREE", "THYROIDAB" in the  last 72 hours.  Invalid input(s): "FREET3" Anemia work up: No results for input(s): "VITAMINB12", "FOLATE", "FERRITIN", "TIBC", "IRON", "RETICCTPCT" in the last 72 hours. Sepsis Labs: Recent Labs  Lab 05/01/23 0352 05/02/23 0442 05/03/23 0407 05/04/23 0325 05/05/23 0730 05/06/23 0903 05/07/23 0414  PROCALCITON 5.51  --  2.10  --   --   --   --   WBC 8.6   < > 7.9 11.2* 5.9 5.8 6.5   < > = values in this interval not displayed.    Microbiology Recent Results (from the past 240 hour(s))  MRSA Next Gen by PCR, Nasal     Status: None   Collection Time: 04/28/23  8:33 PM   Specimen: Nasal Mucosa; Nasal Swab  Result Value Ref Range Status   MRSA by PCR Next Gen NOT DETECTED NOT DETECTED Final    Comment: (NOTE) The GeneXpert MRSA Assay (FDA approved for NASAL specimens only), is one component of a comprehensive MRSA colonization surveillance program. It is not intended to diagnose MRSA infection nor to guide or monitor treatment for MRSA infections. Test performance is not FDA approved in patients less than 43 years old. Performed at [facility], [address] [place], [place] [address]   Culture, blood (Routine X 2) w Reflex to ID Panel     Status: None (Preliminary result)   Collection Time: 05/04/23  3:59 AM   Specimen: BLOOD LEFT ARM  Result Value Ref Range Status  Specimen Description BLOOD LEFT ARM  Final   Special Requests   Final    BOTTLES DRAWN AEROBIC AND ANAEROBIC Blood Culture results may not be optimal due to an inadequate volume of blood received in culture bottles   Culture   Final    NO GROWTH 3 DAYS Performed at [facility], [address] [place], [place] [address]    Report Status PENDING  Incomplete  Culture, blood (Routine X 2) w Reflex to ID Panel     Status: None (Preliminary result)   Collection Time: 05/04/23  3:59 AM   Specimen: BLOOD LEFT HAND  Result Value Ref Range Status   Specimen Description BLOOD LEFT HAND   Final   Special Requests   Final    BOTTLES DRAWN AEROBIC ONLY Blood Culture results may not be optimal due to an inadequate volume of blood received in culture bottles   Culture   Final    NO GROWTH 3 DAYS Performed at [facility], [address] [place], [place] [address]    Report Status PENDING  Incomplete    Procedures and diagnostic studies:  CT ABDOMEN PELVIS W CONTRAST  Result Date: 05/06/2023 CLINICAL DATA:  Abdominal pain, status post resection of enterocutaneous fistula, lysis of adhesions, and abdominal wall reconstruction EXAM: CT ABDOMEN AND PELVIS WITH CONTRAST TECHNIQUE: Multidetector CT imaging of the abdomen and pelvis was performed using the standard protocol following bolus administration of intravenous contrast. RADIATION DOSE REDUCTION: This exam was performed according to the departmental dose-optimization program which includes automated exposure control, adjustment of the mA and/or kV according to patient size and/or use of iterative reconstruction technique. CONTRAST:  OMNIPAQUE IOHEXOL 300 MG/ML  SOLN COMPARISON:  05/04/2023 FINDINGS: Lower chest: There are progressive areas of bibasilar consolidation and enlarging bilateral pleural effusions. Increasing left lower lobe atelectasis. Hepatobiliary: No focal liver abnormality is seen. No gallstones, gallbladder wall thickening, or biliary dilatation. Pancreas: Unremarkable. No pancreatic ductal dilatation or surrounding inflammatory changes. Spleen: Normal in size without focal abnormality. Adrenals/Urinary Tract: The kidneys enhance normally. Interval development of mild right-sided hydronephrosis and hydroureter, without clear cause for obstruction identified. No radiopaque urinary tract calculi. The adrenals and bladder are unremarkable. Stomach/Bowel: Enteric catheter tip within the gastric lumen. No evidence of bowel obstruction or ileus. Postsurgical changes are seen from multiple bowel resection and  reanastomosis within the lower anterior abdomen and upper pelvis. Proximal colon demonstrates nonspecific bowel wall thickening given decompressed state. Since the prior exam, higher attenuation material can be seen within the anterior abdominal wall surgical site. There is loss of normal fat plane between multiple loops of bowel and the lower anterior abdominal wall, just inferior to the reconstruction site, reference axial image 64/71 of series 2. Underlying enterocutaneous fistula in this region cannot be excluded. Vascular/Lymphatic: No significant vascular findings are present. No enlarged abdominal or pelvic lymph nodes. Reproductive: Uterus and bilateral adnexa are unremarkable. Other: Trace free fluid within the bilateral flanks consistent with recent surgical intervention. Punctate foci of free intra-abdominal gas are seen within the upper abdomen, consistent with recent surgical intervention as well as an indwelling surgical drain extending across the lower mid abdomen. Postsurgical changes from reconstruction of the anterior abdominal wall after wide resection, with no evidence of hernia. Musculoskeletal: Subcutaneous drain is identified within the lower anterior abdominal wall. Intraperitoneal drain extends from the left flank crossed to the right mid abdomen. No acute or destructive bony abnormalities. Reconstructed images demonstrate no additional findings. IMPRESSION: 1. Interval  development of high attenuation material throughout the anterior abdominal wall at the site of prior resection and reconstruction. There is loss of normal fat planes between multiple loops of bowel in the lower upper pelvis and the inferior anterior abdominal wall, and enterocutaneous fistula dislocation cannot be excluded. 2. Mild wall thickening of the proximal colon, nonspecific in the decompressed state and recent surgery. No bowel obstruction or ileus. 3. Progressive bilateral airspace disease and enlarging bilateral  effusions, which could reflect asymmetric edema or infection. 4. Trace free fluid within the abdomen and pelvis. No fluid collection or abscess. Electronically Signed   By: Sharlet Salina M.D.   On: 05/06/2023 15:05               LOS: 448 days   Ambermarie Honeyman  [facility]   Pager on [email or web address]. If 7PM-7AM, please contact night-coverage at [email or web address]     05/07/2023, 4:01 PM

## 2023-05-08 LAB — GLUCOSE, CAPILLARY
Glucose-Capillary: 101 mg/dL — ABNORMAL HIGH (ref 70–99)
Glucose-Capillary: 135 mg/dL — ABNORMAL HIGH (ref 70–99)
Glucose-Capillary: 145 mg/dL — ABNORMAL HIGH (ref 70–99)
Glucose-Capillary: 151 mg/dL — ABNORMAL HIGH (ref 70–99)
Glucose-Capillary: 151 mg/dL — ABNORMAL HIGH (ref 70–99)
Glucose-Capillary: 177 mg/dL — ABNORMAL HIGH (ref 70–99)
Glucose-Capillary: 180 mg/dL — ABNORMAL HIGH (ref 70–99)

## 2023-05-08 MED ORDER — LEVOTHYROXINE SODIUM 137 MCG PO TABS
137.0000 ug | ORAL_TABLET | Freq: Every day | ORAL | Status: DC
  Administered 2023-05-09 – 2023-05-14 (×6): 137 ug
  Filled 2023-05-08 (×8): qty 1

## 2023-05-08 MED ORDER — ZINC CHLORIDE 1 MG/ML IV SOLN
INTRAVENOUS | Status: AC
  Filled 2023-05-08: qty 707.2

## 2023-05-08 NOTE — TOC Progression Note (Addendum)
Transition of Care [facility]) - Progression Note    Patient Details  Name: Debra [name] MRN: [id number] Date of Birth: April 11, 1966  Transition of Care [facility] CM/SW Contact  Kreg Shropshire, RN Phone Number: 05/08/2023, 8:30 AM  Clinical Narrative:    CM received email from Francesca Oman Patient Access Navigator Admitting/Outpatient Registration stating the following"  There has been no change in the patient's Medicaid coverage.   She was only approved for 02/13/22 to 03/17/22.    The Medicaid is left on the account for billing upon the patient's discharge from the hospital. The Medicaid coverage cannot be removed from the account. We will bill Medicaid for the covered dates of service.   Cm cced Supervisior Teena Irani and [employer]. Cm  notified Dr. Sterling Big  [phone] sent secure email to [facility] Advisor to ask if pt can be signed up for Medicaid. Cm awaiting to hear back.   Expected Discharge Plan: Home w Home Health Services Barriers to Discharge: Continued Medical Work up  Expected Discharge Plan and Services                                               Social Determinants of Health (SDOH) Interventions SDOH Screenings   Food Insecurity: No Food Insecurity (05/20/2022)  Housing: Low Risk  (05/20/2022)  Transportation Needs: No Transportation Needs (05/20/2022)  Utilities: Not At Risk (05/20/2022)  Tobacco Use: Low Risk  (05/19/2022)    Readmission Risk Interventions     No data to display

## 2023-05-08 NOTE — Progress Notes (Addendum)
[facility] SURGICAL PROGRESS NOTE  Hospital Day(s): 449.   Post op day(s): 10 Days Post-Op.   Interval History:  Patient seen and examined No acute events or new complaints overnight.  Patient resting this morning; still very somnolent; family at bedside  No new labs this AM NGT in place; clamped Surgical drains as follow:   - LLQ (abdominal wall); 75 ccs; sero-feculent in appearance   - RLQ (Intra-abdominal); 170 ccs; sero-feculent in appearance  Now with [name] w/ suction; output 900 ccs; sero-feculent in appearance  Now on Unasyn NPO + TPN   Vital signs in last 24 hours: [min-max] current  Temp:  [98 F (36.7 C)-100 F (37.8 C)] 100 F (37.8 C) (08/30 0400) Pulse Rate:  [76-91] 85 (08/30 0600) Resp:  [11-22] 14 (08/30 0600) BP: (80-115)/(44-68) 115/50 (08/30 0600) SpO2:  [97 %-100 %] 98 % (08/30 0600) Weight:  [108.4 kg] 108.4 kg (08/30 0441)     Height: 4\' 11"  (149.9 cm) Weight: 108.4 kg BMI (Calculated): 48.24   Intake/Output last 2 shifts:  08/29 0701 - 08/30 0700 In: 1895.1 [I.V.:1521.3; NG/GT:60; IV Piggyback:313.9] Out: 2645 [Urine:1500; Drains:245]   Physical Exam:  Constitutional: Resting in bed; NAD HEENT: NGT in place; clamped Respiratory: breathing non-labored at rest; on [place] Cardiovascular: regular rate and sinus rhythm  Gastrointestinal: soft, and non-distended. LLQ drain (abdominal wall); serous output. RLQ drain (intra-abdominal); both with more sero-feculent appearance in drainage Integumentary: Midline wound now with [name] in place, Mesh visible. There appears to be output from left lateral edge, no visible bowel   Labs:     Latest Ref Rng & Units 05/07/2023    4:14 AM 05/06/2023    9:03 AM 05/05/2023    7:57 PM  CBC  WBC 4.0 - 10.5 K/uL 6.5  5.8    Hemoglobin 12.0 - 15.0 g/dL 9.0  16.1  9.7   Hematocrit 36.0 - 46.0 % 28.8  30.9  30.5   Platelets 150 - 400 K/uL 47  46        Latest Ref Rng & Units 05/07/2023    4:14 AM  05/06/2023    9:03 AM 05/05/2023    7:30 AM  CMP  Glucose 70 - 99 mg/dL 096  045  409   BUN 6 - 20 mg/dL 62  59  51   Creatinine 0.44 - 1.00 mg/dL 8.11  9.14  7.82   Sodium 135 - 145 mmol/L 147  150  148   Potassium 3.5 - 5.1 mmol/L 4.0  3.8  4.0   Chloride 98 - 111 mmol/L 112  118  119   CO2 22 - 32 mmol/L 26  27  24    Calcium 8.9 - 10.3 mg/dL 7.6  7.8  7.4     Imaging studies: No new pertinent imaging studies   Assessment/Plan:  57 y.o. 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 Will continue [name] daily; red rubber to suction; do not place in wound bed itself - Greatly appreciate WOC assistance  - No emergent plans for re-intervention surgically, very high risk and may result in further, or additional, complications  - Continue NPO; TPN at goal - Continue Octreotide  - Continue Abx; switch to Unasyn on 08/29; complete 7 days  total - Continue NGT; return to LIS - Continue surgical drains x2; monitor and record output  - Monitor abdominal examination  - Pain control prn; antiemetics prn  - Appreciate medicine assistance   Seen with Dr Aleen Campi, patient's family member updated at bedside.   -- [name], PA-C [facility] 05/08/2023, 7:22 AM M-F: 7am - 4pm

## 2023-05-08 NOTE — Progress Notes (Signed)
Progress Note    Debra [name]  ZOX:[id number] DOB: 25-Aug-1966  DOA: 02/13/2022 PCP: Hillery Aldo, MD      Brief Narrative:    Medical records reviewed and are as summarized below:  Debra [name] is a 57 y.o. 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 with recurrent disease at anterior pelvis next to the anastomosis which was complicated by high output enterocutaneous fistula , and laparotomy excision of greater omental mass, abdominal wall reconstruction with [name], appendectomy and placement of Prevena vacuum on 06/08, complicated with small bowel perforation, underwent reopening of laparotomy and repair of small bowel perforation, high output enterocutaneous fistula, on chronic TPN since April this year, has had a prolonged hospital stay, complicated with right IJ Port-A-Cath Enterococcus infection which was removed and a new PICC line was placed recently.  Hospitalist team was asked to manage patient's hyperglycemia and coagulopathy.     Significant hospital events:  02/13/22: initial laparotomy, excision of greater omental mass, abdominal wall reconstruction with [name], appendectomy, and placement of Prevena vac  02/15/22: EXPLORATORY LAPAROTOMY REPAIR OF BOWEL PERFORATION, INSERTION OF MESH, APPLICATION OF WOUND VAC 02/26/22:  COLON RESECTION SIGMOID, HERNIA REPAIR VENTRAL, APPENDECTOMY, INSERTION OF MESH, REPAIR ABDOMINAL WALL 07/15/22: Neuro surgery consultation due to L1 compression fracture. Not a candidate for lumbosacral orthotic. 12/19/22: Insertion of Port-a-cath 04/06/23: removal of port-a-cath Throughout stay patient has required TPN, WOC & ID consultation.     Recent hospital events  04/28/23: 12 hours and surgery for abdominal wall reconstruction, recurrent incisional hernia repair, partial gastrectomy, small bowel resection, repair enterotomy x 3 and wound VAC  placement.  Patient extubated postop but too lethargic to maintain airway requiring reintubation and transferred to ICU for postoperative mechanical ventilatory management. 04/29/23: Pt remains mechanically intubated FiO2 40%/PEEP 5.  Pt awake and with purposeful movement on precedex gtt.  Will perform SBT with plans for possible extubation  04/30/23: hospitalist consulted for hyperglycemia and coagulopathy. Overnight fever to 101.8, tachycardic, WBC WNL but concern for SIRS/sepsis 08/23: pro calcitonin (+), starting tx for HCAP. MRSA resp screen recently neg 08/20, will get sputum cx. Echo EF 60-65%, G1DD 08/24: hypernatremic, inflammatory markers up (ferritin high, ALP high), still septic/SIRS, escalating abx, repeat CXR, very cautious fluid administration to help reduce sodium and if increased SOB would d/c fluids and repeat CXR, increased insulin, repeat albumin  08/25: Tachycardia has improved, still tachypnea but no longer meeting sepsis/sirs criteria. Sodium about same 152, Hgb stable. Relatively low UOsm 444 c/w osmotic diuresis (get Glc controlled) / loop diuretics (d/c Lasix) / ADH resistance (correct potassium though hypokalemia is not severe, protein malnutrition is most likely greater factor). Gentle hydration and will need to d/w dietary/pharmacy but malnutrition is going to be a persistent issue. Still substantial fluid loss via NG 08/26: overnight, increased confusion, temp 100.9-101.1, tachycardic, wound vac drainage increased, transferred back to stepdown, switch cefepime + flagyl to Zosyn, continue vanc, follow BCx, neuro-checks, given lasix, CT head no acute process, CT abd/pel no abscess abdominal and multifocal lung infiltrate, CTA chest to r/o PE / eval pneumonia/effusion --> no PE, (+)pulmonary edema. WIll hold fluids and try diuresis tonight, hopefully sodium and BP will hold, signed otu to night team in ICU in case needing pressors 08/27: stable this morning, sodium slightly better,  HH worse, will monitor closely, n oUOP documentd, follow  closely .        Assessment/Plan:  Principal Problem:   Enterocutaneous fistula Active Problems:   S/P hernia repair   Closed wedge compression fracture of first lumbar vertebra (HCC)   Recurrent ventral hernia   Arm swelling   Acute respiratory failure with hypoxia (HCC)   Acute metabolic encephalopathy   HCAP (healthcare-associated pneumonia)   Acute pulmonary edema (HCC)   Nutrition Problem: Increased nutrient needs Etiology: wound healing, catabolic illness  Signs/Symptoms: estimated needs   Body mass index is 48.27 kg/m.  (Morbid obesity)      Anasarca/Edema generalized  Hypoalbuminemia seems more likely than CHF but she does have G1 diastolic df  Lasix held because of AKI and hypernatremia S/p treatment with IV albumin 2D echo on 04/30/2023 showed EF estimated 6065%, grade 1 diastolic dysfunction   HCAP Meeting sepsis criteria overnight 08/22-08/24 w/ tachycardia and fever (+)procalcitonin 7 days of antibiotics is adequate for treatment of pneumonia. Dr. Everlene Farrier, surgeon, wants to continue antibiotics for now because of recent abdominal surgery and concern for developing fistula and repeat CT abdomen pelvis from 05/06/2023.Marland Kitchen    Hypernatremia Fluctuating sodium level. She is on TPN for nutrition.  Repeat BMP tomorrow   Hypokalemia Improved    Acute hypoxic respiratory failure  She is on 4 L/min oxygen.  Wean off oxygen as able.    AKI  Creatinine slightly up from 1.28-1.31.  Repeat BMP tomorrow Avoid nephrotoxins.   Acute metabolic encephalopathy She is lethargic.  Continue supportive care   Prediabetes with hyperglycemia Most recent A1c 6.2 indicating a condition of prediabetes at baseline Hyperglycemia likely exacerbated by TPN Continue insulin glargine and NovoLog  Coagulopathy Mild, chronic, review patient's record showed she has a chronic elevation of INR, at baseline 1.2-1.4  and per patient granddaughter, patient has no known history of easy bleeding, no SLE history in the family.  CT abdomen showed no signs of liver cirrhosis or NASH. Getting vitamin K w/ TPN.  recommend checking INR periodically and before surgery     Chronic iron deficiency anemia D/t malabsorption. Recent Iron WNL, TIBC low, ferritin high d/t inflammation H&H is stable   Thrombocytopenia It appears patient has chronic thrombocytopenia.  Monitor platelet count    s/p exploratory laparotomy, extensive lysis of adhesion, 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  Follow-up with general surgeon   Diet Order             Diet NPO time specified Except for: Ice Chips, Sips with Meds  Diet effective now                             Medications:    sodium chloride   Intravenous Once   alteplase  2 mg Intracatheter Once   Chlorhexidine Gluconate Cloth  6 each Topical Daily   heparin injection (subcutaneous)  5,000 Units Subcutaneous Q8H   insulin aspart  0-20 Units Subcutaneous Q4H   insulin aspart  4 Units Subcutaneous Q4H   insulin glargine-yfgn  22 Units Subcutaneous Daily   [START ON 05/09/2023] levothyroxine  137 mcg Per Tube Q0600   lidocaine  2 patch Transdermal Q24H   octreotide  100 mcg Subcutaneous Q8H   pantoprazole (PROTONIX) IV  40 mg Intravenous Q12H   pregabalin  200 mg Per Tube TID   sodium chloride flush  10-40 mL Intracatheter Q12H   Continuous Infusions:  ampicillin-sulbactam (UNASYN) IV 3 g (05/08/23 1653)  methocarbamol (ROBAXIN) IV     TPN ADULT (ION) 65 mL/hr at 05/08/23 1300   TPN ADULT (ION)       Anti-infectives (From admission, onward)    Start     Dose/Rate Route Frequency Ordered Stop   05/08/23 0600  Ampicillin-Sulbactam (UNASYN) 3 g in sodium chloride 0.9 % 100 mL IVPB        3 g 200 mL/hr over 30 Minutes  Intravenous Every 6 hours 05/07/23 0949 05/14/23 2359   05/05/23 0807  vancomycin variable dose per unstable renal function (pharmacist dosing)  Status:  Discontinued         Does not apply See admin instructions 05/05/23 0807 05/05/23 0823   05/04/23 1800  vancomycin (VANCOCIN) IVPB 1000 mg/200 mL premix  Status:  Discontinued        1,000 mg 200 mL/hr over 60 Minutes Intravenous Every 24 hours 05/04/23 0755 05/05/23 0807   05/04/23 0600  piperacillin-tazobactam (ZOSYN) IVPB 3.375 g        3.375 g 12.5 mL/hr over 240 Minutes Intravenous Every 8 hours 05/04/23 0236 05/08/23 0158   05/03/23 1800  vancomycin (VANCOCIN) IVPB 1000 mg/200 mL premix  Status:  Discontinued        1,000 mg 200 mL/hr over 60 Minutes Intravenous Every 24 hours 05/02/23 1530 05/03/23 0823   05/03/23 1800  vancomycin (VANCOREADY) IVPB 1250 mg/250 mL  Status:  Discontinued        1,250 mg 166.7 mL/hr over 90 Minutes Intravenous Every 24 hours 05/03/23 0823 05/03/23 1752   05/03/23 1800  Vancomycin (VANCOCIN) 1,250 mg in sodium chloride 0.9 % 250 mL IVPB  Status:  Discontinued        1,250 mg 166.7 mL/hr over 90 Minutes Intravenous Every 24 hours 05/03/23 1752 05/04/23 0755   05/02/23 2000  ceFEPIme (MAXIPIME) 2 g in sodium chloride 0.9 % 100 mL IVPB  Status:  Discontinued        2 g 200 mL/hr over 30 Minutes Intravenous Every 8 hours 05/02/23 1530 05/04/23 0236   05/02/23 1630  vancomycin (VANCOREADY) IVPB 2000 mg/400 mL        2,000 mg 200 mL/hr over 120 Minutes Intravenous  Once 05/02/23 1530 05/02/23 1746   05/02/23 1600  metroNIDAZOLE (FLAGYL) IVPB 500 mg  Status:  Discontinued        500 mg 100 mL/hr over 60 Minutes Intravenous Every 12 hours 05/02/23 1453 05/04/23 0236   05/01/23 1400  azithromycin (ZITHROMAX) 500 mg in sodium chloride 0.9 % 250 mL IVPB  Status:  Discontinued        500 mg 250 mL/hr over 60 Minutes Intravenous Every 24 hours 05/01/23 1255 05/02/23 1453   05/01/23 1230  azithromycin (ZITHROMAX)  tablet 500 mg  Status:  Discontinued        500 mg Oral Daily 05/01/23 1135 05/01/23 1255   05/01/23 1200  cefTRIAXone (ROCEPHIN) 2 g in sodium chloride 0.9 % 100 mL IVPB  Status:  Discontinued        2 g 200 mL/hr over 30 Minutes Intravenous Every 24 hours 05/01/23 1135 05/02/23 1453   04/28/23 2200  cefoTEtan (CEFOTAN) 2 g in sodium chloride 0.9 % 100 mL IVPB        2 g 200 mL/hr over 30 Minutes Intravenous Every 8 hours 04/28/23 2058 04/29/23 1717   04/27/23 0945  cefoTEtan (CEFOTAN) 2 g in sodium chloride 0.9 % 100 mL IVPB        2 g 200  mL/hr over 30 Minutes Intravenous On call to O.R. 04/27/23 0856 04/28/23 0559   04/05/23 0900  cefTRIAXone (ROCEPHIN) 2 g in sodium chloride 0.9 % 100 mL IVPB        2 g 200 mL/hr over 30 Minutes Intravenous Every 24 hours 04/05/23 0634 04/14/23 1254   12/19/22 1000  ceFAZolin (ANCEF) IVPB 2g/100 mL premix        2 g 200 mL/hr over 30 Minutes Intravenous  Once 12/18/22 1738 12/19/22 1141   09/17/22 1200  vancomycin (VANCOREADY) IVPB 1250 mg/250 mL  Status:  Discontinued        1,250 mg 166.7 mL/hr over 90 Minutes Intravenous Every 24 hours 09/16/22 1040 09/22/22 1139   09/16/22 1200  vancomycin (VANCOREADY) IVPB 1250 mg/250 mL        1,250 mg 166.7 mL/hr over 90 Minutes Intravenous  Once 09/15/22 1706 09/16/22 1255   09/15/22 1200  ceFEPIme (MAXIPIME) 2 g in sodium chloride 0.9 % 100 mL IVPB  Status:  Discontinued        2 g 200 mL/hr over 30 Minutes Intravenous Every 8 hours 09/15/22 1024 09/18/22 1026   09/15/22 1200  vancomycin (VANCOREADY) IVPB 2000 mg/400 mL        2,000 mg 200 mL/hr over 120 Minutes Intravenous  Once 09/15/22 1024 09/15/22 1500   03/04/22 2200  piperacillin-tazobactam (ZOSYN) IVPB 3.375 g        3.375 g 12.5 mL/hr over 240 Minutes Intravenous Every 8 hours 03/04/22 2005 03/18/22 0248   02/21/22 1500  vancomycin (VANCOREADY) IVPB 1250 mg/250 mL  Status:  Discontinued        1,250 mg 166.7 mL/hr over 90 Minutes Intravenous  Every 24 hours 02/21/22 1111 02/24/22 1336   02/21/22 1400  meropenem (MERREM) 1 g in sodium chloride 0.9 % 100 mL IVPB        1 g 200 mL/hr over 30 Minutes Intravenous Every 8 hours 02/21/22 1258 02/27/22 2157   02/20/22 1500  vancomycin (VANCOCIN) IVPB 1000 mg/200 mL premix  Status:  Discontinued        1,000 mg 200 mL/hr over 60 Minutes Intravenous Every 24 hours 02/19/22 1706 02/21/22 1111   02/19/22 1115  vancomycin (VANCOREADY) IVPB 2000 mg/400 mL        2,000 mg 200 mL/hr over 120 Minutes Intravenous  Once 02/19/22 1025 02/19/22 1732   02/19/22 1115  fluconazole (DIFLUCAN) IVPB 400 mg        400 mg 100 mL/hr over 120 Minutes Intravenous Every 24 hours 02/19/22 1025 03/04/22 1301   02/15/22 0730  piperacillin-tazobactam (ZOSYN) IVPB 3.375 g  Status:  Discontinued        3.375 g 12.5 mL/hr over 240 Minutes Intravenous Every 8 hours 02/15/22 0639 02/21/22 1235   02/15/22 0655  piperacillin-tazobactam (ZOSYN) 3.375 GM/50ML IVPB       Note to Pharmacy: Loretha Stapler N: cabinet override      02/15/22 0655 02/15/22 0816   02/13/22 2200  cefoTEtan (CEFOTAN) 2 g in sodium chloride 0.9 % 100 mL IVPB  Status:  Discontinued        2 g 200 mL/hr over 30 Minutes Intravenous Every 12 hours 02/13/22 1541 02/15/22 0639   02/13/22 1345  cefoTEtan (CEFOTAN) 2 g in sodium chloride 0.9 % 100 mL IVPB        2 g 200 mL/hr over 30 Minutes Intravenous  Once 02/13/22 1331 02/13/22 2146   02/13/22 1336  sodium chloride 0.9 % with cefoTEtan (  CEFOTAN) ADS Med       Note to Pharmacy: Gates Rigg: cabinet override      02/13/22 1336 02/14/22 0144   02/13/22 0620  sodium chloride 0.9 % with cefoTEtan (CEFOTAN) ADS Med       Note to Pharmacy: Agnes Lawrence A: cabinet override      02/13/22 0620 02/13/22 0802   02/13/22 0600  cefoTEtan (CEFOTAN) 2 g in sodium chloride 0.9 % 100 mL IVPB        2 g 200 mL/hr over 30 Minutes Intravenous On call to O.R. 02/12/22 2229 02/13/22 2146               Family Communication/Anticipated D/C date and plan/Code Status   DVT prophylaxis: heparin injection 5,000 Units Start: 05/04/23 1645 SCDs Start: 02/13/22 1542     Code Status: Full Code  Family Communication: Boyfriend at the bedside       Subjective:   Interval events noted.  Her boyfriend and her niece were at the bedside.  Alcario Drought, RN, was also at the bedside.  Objective:    Vitals:   05/08/23 1300 05/08/23 1400 05/08/23 1500 05/08/23 1600  BP: (!) 98/50 (!) 108/49 (!) 99/48 (!) 95/51  Pulse: 74 76 87 83  Resp: 16 17 17 13   Temp:    99.3 F (37.4 C)  TempSrc:    Axillary  SpO2: 96% 95% 93% 94%  Weight:      Height:       No data found.   Intake/Output Summary (Last 24 hours) at 05/08/2023 1735 Last data filed at 05/08/2023 1657 Gross per 24 hour  Intake 1550.28 ml  Output 3395 ml  Net -1844.72 ml   Filed Weights   05/06/23 0500 05/07/23 0420 05/08/23 0441  Weight: 108 kg 108.3 kg 108.4 kg    Exam:   GEN: No acute distress SKIN: Warm and dry EYES: No ENT: MMM CV: RRR PULM: CTA B ABD: soft, ND, NT, +BS CNS: Lethargic/drowsy.  Mumbles some incomprehensible words. EXT: Edema of bilateral upper and lower extremities     Data Reviewed:   I have personally reviewed following labs and imaging studies:  Labs: Labs show the following:   Basic Metabolic Panel: Recent Labs  Lab 05/03/23 0407 05/03/23 1454 05/03/23 2154 05/04/23 0325 05/04/23 1046 05/05/23 0730 05/06/23 0903 05/07/23 0414  NA 152*   < > 149* 149*  150* 150* 148* 150* 147*  K 3.2*  --  4.1 4.6  --  4.0 3.8 4.0  CL 119*  --  118* 118*  --  119* 118* 112*  CO2 27  --  25 24  --  24 27 26   GLUCOSE 226*  --  180* 121*  --  148* 180* 135*  BUN 36*  --  37* 44*  --  51* 59* 62*  CREATININE 0.78  --  0.90 1.05*  --  1.38* 1.28* 1.31*  CALCIUM 8.3*  --  8.0* 7.9*  --  7.4* 7.8* 7.6*  MG 2.4  --   --  2.2  --   --   --   --   PHOS 2.2*  --   --  4.3  --   --   --    --    < > = values in this interval not displayed.   GFR Estimated Creatinine Clearance: 50.6 mL/min (A) (by C-G formula based on SCr of 1.31 mg/dL (H)). Liver Function Tests: Recent Labs  Lab 05/02/23 [phone] 05/03/23  2154 05/04/23 0325  AST 53* 72* 79*  ALT 50* 74* 75*  ALKPHOS 167* 192* 194*  BILITOT 2.4* 3.0* 3.2*  PROT 4.8* 5.1* 4.9*  ALBUMIN 2.1* 2.3* 2.1*   No results for input(s): "LIPASE", "AMYLASE" in the last 168 hours. No results for input(s): "AMMONIA" in the last 168 hours. Coagulation profile Recent Labs  Lab 05/03/23 0407 05/03/23 2154  INR 1.5* 1.5*    CBC: Recent Labs  Lab 05/02/23 0442 05/03/23 0407 05/04/23 0325 05/05/23 0730 05/05/23 1957 05/06/23 0903 05/07/23 0414  WBC 7.7 7.9 11.2* 5.9  --  5.8 6.5  NEUTROABS 5.6 5.6 8.3*  --   --   --   --   HGB 7.8* 7.8* 8.2* 7.5* 9.7* 10.1* 9.0*  HCT 24.3* 23.8* 26.2* 24.9* 30.5* 30.9* 28.8*  MCV 88.4 89.1 93.2 98.0  --  90.6 94.1  PLT 42* 46* 46* 45*  --  46* 47*   Cardiac Enzymes: No results for input(s): "CKTOTAL", "CKMB", "CKMBINDEX", "TROPONINI" in the last 168 hours. BNP (last 3 results) No results for input(s): "PROBNP" in the last 8760 hours. CBG: Recent Labs  Lab 05/08/23 0344 05/08/23 0801 05/08/23 1129 05/08/23 1559 05/08/23 1643  GLUCAP 145* 101* 151* 177* 180*   D-Dimer: No results for input(s): "DDIMER" in the last 72 hours.  Hgb A1c: No results for input(s): "HGBA1C" in the last 72 hours. Lipid Profile: No results for input(s): "CHOL", "HDL", "LDLCALC", "TRIG", "CHOLHDL", "LDLDIRECT" in the last 72 hours.  Thyroid function studies: No results for input(s): "TSH", "T4TOTAL", "T3FREE", "THYROIDAB" in the last 72 hours.  Invalid input(s): "FREET3" Anemia work up: No results for input(s): "VITAMINB12", "FOLATE", "FERRITIN", "TIBC", "IRON", "RETICCTPCT" in the last 72 hours. Sepsis Labs: Recent Labs  Lab 05/03/23 0407 05/04/23 0325 05/05/23 0730 05/06/23 0903  05/07/23 0414  PROCALCITON 2.10  --   --   --   --   WBC 7.9 11.2* 5.9 5.8 6.5    Microbiology Recent Results (from the past 240 hour(s))  MRSA Next Gen by PCR, Nasal     Status: None   Collection Time: 04/28/23  8:33 PM   Specimen: Nasal Mucosa; Nasal Swab  Result Value Ref Range Status   MRSA by PCR Next Gen NOT DETECTED NOT DETECTED Final    Comment: (NOTE) The GeneXpert MRSA Assay (FDA approved for NASAL specimens only), is one component of a comprehensive MRSA colonization surveillance program. It is not intended to diagnose MRSA infection nor to guide or monitor treatment for MRSA infections. Test performance is not FDA approved in patients less than 92 years old. Performed at [facility], [address] [place], [place] [address]   Culture, blood (Routine X 2) w Reflex to ID Panel     Status: None (Preliminary result)   Collection Time: 05/04/23  3:59 AM   Specimen: BLOOD LEFT ARM  Result Value Ref Range Status   Specimen Description BLOOD LEFT ARM  Final   Special Requests   Final    BOTTLES DRAWN AEROBIC AND ANAEROBIC Blood Culture results may not be optimal due to an inadequate volume of blood received in culture bottles   Culture   Final    NO GROWTH 4 DAYS Performed at [facility], [address] [place], [place] [address]    Report Status PENDING  Incomplete  Culture, blood (Routine X 2) w Reflex to ID Panel     Status: None (Preliminary result)   Collection Time: 05/04/23  3:59 AM  Specimen: BLOOD LEFT HAND  Result Value Ref Range Status   Specimen Description BLOOD LEFT HAND  Final   Special Requests   Final    BOTTLES DRAWN AEROBIC ONLY Blood Culture results may not be optimal due to an inadequate volume of blood received in culture bottles   Culture   Final    NO GROWTH 4 DAYS Performed at [facility], [address], [place], [place] [address]    Report Status PENDING  Incomplete    Procedures and diagnostic  studies:  No results found.             LOS: 449 days   Tylesha Gibeault  [employer] loss adjuster on [email or web address]. If 7PM-7AM, please contact night-coverage at [email or web address]     05/08/2023, 5:35 PM

## 2023-05-08 NOTE — Progress Notes (Signed)
PHARMACY - TOTAL PARENTERAL NUTRITION CONSULT NOTE   Indication: Prolonged ileus  Patient Measurements: Height: 4\' 11"  (149.9 cm) Weight: 108.4 kg (238 lb 15.7 oz) IBW/kg (Calculated) : 43.2 TPN AdjBW (KG): 59 Body mass index is 48.27 kg/m.  Assessment: Debra [name] is a 57 y.o. female s/p laparotomy, excision of greater omental mass, abdominal wall reconstruction with [name], appendectomy, and placement of Prevena vac.  Glucose / Insulin:  BG last 24h: 112 - 145 SSI last 24h: 12u rSSI q4h + Novolog 4u q4h + Semglee 22u daily + insulin removed from TPN 8/26 Electrolytes: Hypernatremia, hyperchloremia Renal: SCr 1.31 (baseline ~ 0.5) Hepatic: Mild, stable transaminitis. 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, partial gastrectomy, abdominal wall reconstruction with mesh placement, and wound vac placement   Central access: PICC 04/09/2023   TPN start date: 04/09/23 TPN from 02/15/2022 to 04/05/2023 - held for bacteremia and TPN restarted 04/09/23 Previously Cyclic TPN, transitioned back to regular TPN on 04/29/23 post surgery  RD Assessment:  Estimated Needs Total Energy Estimated Needs: 1900-2200kcal/day Total Protein Estimated Needs: 95-110g/day Total Fluid Estimated Needs: 1.4-1.6L/day  Current Nutrition: NPO  Plan:  --Continue TPN at new goal rate of 65 mL/hr at 1800 --Electrolytes in TPN (standard): Na 50 mEq/L, K  66mEq/L, Ca 64mEq/L, Mg 66mEq/L, and Phos 63mmol/L --Cl:Ac 1:1 --Continue multivitamin, trace elements, zinc, selenium and chromium chloride to TPN --Discussed with MD and will remove insulin from TPN bag. MD will manage glucose via [place] insulin regimen --Monitor TPN labs daily until stable then on Mon/Thurs  [name] 05/08/2023 7:43 AM

## 2023-05-08 NOTE — Plan of Care (Signed)
Discussed with patient in front of family plan of care for the evening, pain management and bedtime medications with teach back displayed.  Patient interpreted.  What was important was mouth care at this time.  Problem: Nutrition: Goal: Adequate nutrition will be maintained Outcome: Progressing   Problem: Activity: Goal: Risk for activity intolerance will decrease Outcome: Not Progressing   Problem: Pain Managment: Goal: General experience of comfort will improve Outcome: Progressing

## 2023-05-09 LAB — PHOSPHORUS: Phosphorus: 2.3 mg/dL — ABNORMAL LOW (ref 2.5–4.6)

## 2023-05-09 LAB — CULTURE, BLOOD (ROUTINE X 2)
Culture: NO GROWTH
Culture: NO GROWTH

## 2023-05-09 LAB — CBC
HCT: 30.6 % — ABNORMAL LOW (ref 36.0–46.0)
Hemoglobin: 9.5 g/dL — ABNORMAL LOW (ref 12.0–15.0)
MCH: 29.9 pg (ref 26.0–34.0)
MCHC: 31 g/dL (ref 30.0–36.0)
MCV: 96.2 fL (ref 80.0–100.0)
Platelets: 56 10*3/uL — ABNORMAL LOW (ref 150–400)
RBC: 3.18 MIL/uL — ABNORMAL LOW (ref 3.87–5.11)
RDW: 23.9 % — ABNORMAL HIGH (ref 11.5–15.5)
WBC: 4.7 10*3/uL (ref 4.0–10.5)
nRBC: 0 % (ref 0.0–0.2)

## 2023-05-09 LAB — BASIC METABOLIC PANEL
Anion gap: 8 (ref 5–15)
BUN: 54 mg/dL — ABNORMAL HIGH (ref 6–20)
CO2: 27 mmol/L (ref 22–32)
Calcium: 8.1 mg/dL — ABNORMAL LOW (ref 8.9–10.3)
Chloride: 120 mmol/L — ABNORMAL HIGH (ref 98–111)
Creatinine, Ser: 0.82 mg/dL (ref 0.44–1.00)
GFR, Estimated: >60 mL/min (ref >60)
Glucose, Bld: 204 mg/dL — ABNORMAL HIGH (ref 70–99)
Potassium: 4.3 mmol/L (ref 3.5–5.1)
Sodium: 155 mmol/L — ABNORMAL HIGH (ref 135–145)

## 2023-05-09 LAB — GLUCOSE, CAPILLARY
Glucose-Capillary: 131 mg/dL — ABNORMAL HIGH (ref 70–99)
Glucose-Capillary: 138 mg/dL — ABNORMAL HIGH (ref 70–99)
Glucose-Capillary: 141 mg/dL — ABNORMAL HIGH (ref 70–99)
Glucose-Capillary: 147 mg/dL — ABNORMAL HIGH (ref 70–99)
Glucose-Capillary: 152 mg/dL — ABNORMAL HIGH (ref 70–99)

## 2023-05-09 LAB — ALBUMIN: Albumin: 1.8 g/dL — ABNORMAL LOW (ref 3.5–5.0)

## 2023-05-09 LAB — MAGNESIUM: Magnesium: 2.8 mg/dL — ABNORMAL HIGH (ref 1.7–2.4)

## 2023-05-09 MED ORDER — MORPHINE SULFATE (PF) 2 MG/ML IV SOLN
2.0000 mg | INTRAVENOUS | Status: DC | PRN
  Administered 2023-05-09 – 2023-05-13 (×13): 2 mg via INTRAVENOUS
  Filled 2023-05-09 (×13): qty 1

## 2023-05-09 MED ORDER — ACETAMINOPHEN 10 MG/ML IV SOLN
1000.0000 mg | Freq: Four times a day (QID) | INTRAVENOUS | Status: AC | PRN
  Administered 2023-05-10: 1000 mg via INTRAVENOUS
  Filled 2023-05-09: qty 100

## 2023-05-09 MED ORDER — DEXTROSE 5 % IV SOLN: INTRAVENOUS | Status: DC

## 2023-05-09 MED ORDER — ZINC CHLORIDE 1 MG/ML IV SOLN
INTRAVENOUS | Status: AC
  Filled 2023-05-09: qty 707.2

## 2023-05-09 NOTE — Progress Notes (Signed)
PHARMACY - TOTAL PARENTERAL NUTRITION CONSULT NOTE   Indication: Prolonged ileus  Patient Measurements: Height: 4\' 11"  (149.9 cm) Weight: 108.9 kg (240 lb 1.3 oz) IBW/kg (Calculated) : 43.2 TPN AdjBW (KG): 59 Body mass index is 48.49 kg/m.  Assessment: Debra [name] is a 57 y.o. female s/p laparotomy, excision of greater omental mass, abdominal wall reconstruction with [name], appendectomy, and placement of Prevena vac.  Glucose / Insulin:  BG last 24h: 138 - 152 SSI last 24h: 15u rSSI q4h + Novolog 4u q4h + Semglee 22u daily + insulin removed from TPN 8/26 Electrolytes: Hypernatremia, hyperchloremia Renal: SCr 1.31 (baseline ~ 0.5) Hepatic: Mild, stable transaminitis. 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, partial gastrectomy, abdominal wall reconstruction with mesh placement, and wound vac placement   Central access: PICC 04/09/2023   TPN start date: 04/09/23 TPN from 02/15/2022 to 04/05/2023 - held for bacteremia and TPN restarted 04/09/23 Previously Cyclic TPN, transitioned back to regular TPN on 04/29/23 post surgery  RD Assessment:  Estimated Needs Total Energy Estimated Needs: 1900-2200kcal/day Total Protein Estimated Needs: 95-110g/day Total Fluid Estimated Needs: 1.4-1.6L/day  Current Nutrition: NPO  Plan:  --Continue TPN at new goal rate of 65 mL/hr at 1800 --Electrolytes in TPN (standard): Na 50 mEq/L, K  30mEq/L, Ca 30mEq/L, Mg 18mEq/L, and Phos 44mmol/L --Cl:Ac 1:1 --Continue multivitamin, trace elements, zinc, selenium and chromium chloride to TPN --Discussed with MD and will remove insulin from TPN bag. MD will manage glucose via [place] insulin regimen --Monitor TPN labs daily until stable then on Mon/Thurs  [facility] A Mykel Mohl 05/09/2023 8:39 AM

## 2023-05-09 NOTE — Progress Notes (Signed)
Progress Note    Debra [name]  UXN:[id number] DOB: November 07, 1965  DOA: 02/13/2022 PCP: Hillery Aldo, MD      Brief Narrative:    Medical records reviewed and are as summarized below:  Debra [name] is a 57 y.o. 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 with recurrent disease at anterior pelvis next to the anastomosis which was complicated by high output enterocutaneous fistula , and laparotomy excision of greater omental mass, abdominal wall reconstruction with [name], appendectomy and placement of Prevena vacuum on 06/08, complicated with small bowel perforation, underwent reopening of laparotomy and repair of small bowel perforation, high output enterocutaneous fistula, on chronic TPN since April this year, has had a prolonged hospital stay, complicated with right IJ Port-A-Cath Enterococcus infection which was removed and a new PICC line was placed recently.  Hospitalist team was asked to manage patient's hyperglycemia and coagulopathy.     Significant hospital events:  02/13/22: initial laparotomy, excision of greater omental mass, abdominal wall reconstruction with [name], appendectomy, and placement of Prevena vac  02/15/22: EXPLORATORY LAPAROTOMY REPAIR OF BOWEL PERFORATION, INSERTION OF MESH, APPLICATION OF WOUND VAC 02/26/22:  COLON RESECTION SIGMOID, HERNIA REPAIR VENTRAL, APPENDECTOMY, INSERTION OF MESH, REPAIR ABDOMINAL WALL 07/15/22: Neuro surgery consultation due to L1 compression fracture. Not a candidate for lumbosacral orthotic. 12/19/22: Insertion of Port-a-cath 04/06/23: removal of port-a-cath Throughout stay patient has required TPN, WOC & ID consultation.     Recent hospital events  04/28/23: 12 hours and surgery for abdominal wall reconstruction, recurrent incisional hernia repair, partial gastrectomy, small bowel resection, repair enterotomy x 3 and wound VAC  placement.  Patient extubated postop but too lethargic to maintain airway requiring reintubation and transferred to ICU for postoperative mechanical ventilatory management. 04/29/23: Pt remains mechanically intubated FiO2 40%/PEEP 5.  Pt awake and with purposeful movement on precedex gtt.  Will perform SBT with plans for possible extubation  04/30/23: hospitalist consulted for hyperglycemia and coagulopathy. Overnight fever to 101.8, tachycardic, WBC WNL but concern for SIRS/sepsis 08/23: pro calcitonin (+), starting tx for HCAP. MRSA resp screen recently neg 08/20, will get sputum cx. Echo EF 60-65%, G1DD 08/24: hypernatremic, inflammatory markers up (ferritin high, ALP high), still septic/SIRS, escalating abx, repeat CXR, very cautious fluid administration to help reduce sodium and if increased SOB would d/c fluids and repeat CXR, increased insulin, repeat albumin  08/25: Tachycardia has improved, still tachypnea but no longer meeting sepsis/sirs criteria. Sodium about same 152, Hgb stable. Relatively low UOsm 444 c/w osmotic diuresis (get Glc controlled) / loop diuretics (d/c Lasix) / ADH resistance (correct potassium though hypokalemia is not severe, protein malnutrition is most likely greater factor). Gentle hydration and will need to d/w dietary/pharmacy but malnutrition is going to be a persistent issue. Still substantial fluid loss via NG 08/26: overnight, increased confusion, temp 100.9-101.1, tachycardic, wound vac drainage increased, transferred back to stepdown, switch cefepime + flagyl to Zosyn, continue vanc, follow BCx, neuro-checks, given lasix, CT head no acute process, CT abd/pel no abscess abdominal and multifocal lung infiltrate, CTA chest to r/o PE / eval pneumonia/effusion --> no PE, (+)pulmonary edema. WIll hold fluids and try diuresis tonight, hopefully sodium and BP will hold, signed otu to night team in ICU in case needing pressors 08/27: stable this morning, sodium slightly better,  HH worse, will monitor closely, n oUOP documentd, follow  closely .        Assessment/Plan:  Principal Problem:   Enterocutaneous fistula Active Problems:   S/P hernia repair   Closed wedge compression fracture of first lumbar vertebra (HCC)   Recurrent ventral hernia   Arm swelling   Acute respiratory failure with hypoxia (HCC)   Acute metabolic encephalopathy   HCAP (healthcare-associated pneumonia)   Acute pulmonary edema (HCC)   Nutrition Problem: Increased nutrient needs Etiology: wound healing, catabolic illness  Signs/Symptoms: estimated needs   Body mass index is 48.49 kg/m.  (Morbid obesity)      Anasarca/Edema generalized  Hypoalbuminemia seems more likely than CHF but she does have G1 diastolic df  Lasix held because of AKI and hypernatremia S/p treatment with IV albumin 2D echo on 04/30/2023 showed EF estimated 6065%, grade 1 diastolic dysfunction   HCAP Meeting sepsis criteria overnight 08/22-08/24 w/ tachycardia and fever (+)procalcitonin 7 days of antibiotics is adequate for treatment of pneumonia. Dr. Everlene Farrier, surgeon, wants to continue antibiotics for now because of recent abdominal surgery and concern for developing fistula and repeat CT abdomen pelvis from 05/06/2023.Marland Kitchen    Hypernatremia Worsening hyponatremia.  Start IV 5% dextrose infusion and monitor sodium level closely. Discussed with Dr. Aleen Campi, general surgeon.   Hypokalemia Improved    Acute hypoxic respiratory failure  She is on 4 L/min oxygen.  Wean off oxygen as able.    AKI  Improved.  Avoid nephrotoxins.   Acute metabolic encephalopathy Continue supportive care   Prediabetes with hyperglycemia Most recent A1c 6.2 indicating a condition of prediabetes at baseline Hyperglycemia likely exacerbated by TPN Continue insulin glargine and NovoLog   Coagulopathy Mild, chronic, review patient's record showed she has a chronic elevation of INR, at baseline 1.2-1.4 and per  patient granddaughter, patient has no known history of easy bleeding, no SLE history in the family.  CT abdomen showed no signs of liver cirrhosis or NASH. Getting vitamin K w/ TPN.  recommend checking INR periodically and before surgery     Chronic iron deficiency anemia D/t malabsorption. Recent Iron WNL, TIBC low, ferritin high d/t inflammation H&H is stable   Thrombocytopenia It appears patient has chronic thrombocytopenia.  Monitor platelet count    s/p exploratory laparotomy, extensive lysis of adhesion, 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  Follow-up with general surgeon   Diet Order             Diet NPO time specified Except for: Ice Chips, Sips with Meds  Diet effective now                             Medications:    sodium chloride   Intravenous Once   alteplase  2 mg Intracatheter Once   Chlorhexidine Gluconate Cloth  6 each Topical Daily   heparin injection (subcutaneous)  5,000 Units Subcutaneous Q8H   insulin aspart  0-20 Units Subcutaneous Q4H   insulin aspart  4 Units Subcutaneous Q4H   insulin glargine-yfgn  22 Units Subcutaneous Daily   levothyroxine  137 mcg Per Tube Q0600   lidocaine  2 patch Transdermal Q24H   octreotide  100 mcg Subcutaneous Q8H   pantoprazole (PROTONIX) IV  40 mg Intravenous Q12H   pregabalin  200 mg Per Tube TID   sodium chloride flush  10-40 mL Intracatheter Q12H   Continuous Infusions:  ampicillin-sulbactam (UNASYN) IV Stopped (05/09/23 1207)   dextrose     methocarbamol (ROBAXIN)  IV     TPN ADULT (ION) 65 mL/hr at 05/09/23 1243   TPN ADULT (ION)       Anti-infectives (From admission, onward)    Start     Dose/Rate Route Frequency Ordered Stop   05/08/23 0600  Ampicillin-Sulbactam (UNASYN) 3 g in sodium chloride 0.9 % 100 mL IVPB        3 g 200 mL/hr over 30 Minutes Intravenous  Every 6 hours 05/07/23 0949 05/14/23 2359   05/05/23 0807  vancomycin variable dose per unstable renal function (pharmacist dosing)  Status:  Discontinued         Does not apply See admin instructions 05/05/23 0807 05/05/23 0823   05/04/23 1800  vancomycin (VANCOCIN) IVPB 1000 mg/200 mL premix  Status:  Discontinued        1,000 mg 200 mL/hr over 60 Minutes Intravenous Every 24 hours 05/04/23 0755 05/05/23 0807   05/04/23 0600  piperacillin-tazobactam (ZOSYN) IVPB 3.375 g        3.375 g 12.5 mL/hr over 240 Minutes Intravenous Every 8 hours 05/04/23 0236 05/08/23 0158   05/03/23 1800  vancomycin (VANCOCIN) IVPB 1000 mg/200 mL premix  Status:  Discontinued        1,000 mg 200 mL/hr over 60 Minutes Intravenous Every 24 hours 05/02/23 1530 05/03/23 0823   05/03/23 1800  vancomycin (VANCOREADY) IVPB 1250 mg/250 mL  Status:  Discontinued        1,250 mg 166.7 mL/hr over 90 Minutes Intravenous Every 24 hours 05/03/23 0823 05/03/23 1752   05/03/23 1800  Vancomycin (VANCOCIN) 1,250 mg in sodium chloride 0.9 % 250 mL IVPB  Status:  Discontinued        1,250 mg 166.7 mL/hr over 90 Minutes Intravenous Every 24 hours 05/03/23 1752 05/04/23 0755   05/02/23 2000  ceFEPIme (MAXIPIME) 2 g in sodium chloride 0.9 % 100 mL IVPB  Status:  Discontinued        2 g 200 mL/hr over 30 Minutes Intravenous Every 8 hours 05/02/23 1530 05/04/23 0236   05/02/23 1630  vancomycin (VANCOREADY) IVPB 2000 mg/400 mL        2,000 mg 200 mL/hr over 120 Minutes Intravenous  Once 05/02/23 1530 05/02/23 1746   05/02/23 1600  metroNIDAZOLE (FLAGYL) IVPB 500 mg  Status:  Discontinued        500 mg 100 mL/hr over 60 Minutes Intravenous Every 12 hours 05/02/23 1453 05/04/23 0236   05/01/23 1400  azithromycin (ZITHROMAX) 500 mg in sodium chloride 0.9 % 250 mL IVPB  Status:  Discontinued        500 mg 250 mL/hr over 60 Minutes Intravenous Every 24 hours 05/01/23 1255 05/02/23 1453   05/01/23 1230  azithromycin (ZITHROMAX) tablet 500  mg  Status:  Discontinued        500 mg Oral Daily 05/01/23 1135 05/01/23 1255   05/01/23 1200  cefTRIAXone (ROCEPHIN) 2 g in sodium chloride 0.9 % 100 mL IVPB  Status:  Discontinued        2 g 200 mL/hr over 30 Minutes Intravenous Every 24 hours 05/01/23 1135 05/02/23 1453   04/28/23 2200  cefoTEtan (CEFOTAN) 2 g in sodium chloride 0.9 % 100 mL IVPB        2 g 200 mL/hr over 30 Minutes Intravenous Every 8 hours 04/28/23 2058 04/29/23 1717   04/27/23 0945  cefoTEtan (CEFOTAN) 2 g in sodium chloride 0.9 % 100 mL IVPB        2 g 200 mL/hr over  30 Minutes Intravenous On call to O.R. 04/27/23 0856 04/28/23 0559   04/05/23 0900  cefTRIAXone (ROCEPHIN) 2 g in sodium chloride 0.9 % 100 mL IVPB        2 g 200 mL/hr over 30 Minutes Intravenous Every 24 hours 04/05/23 0634 04/14/23 1254   12/19/22 1000  ceFAZolin (ANCEF) IVPB 2g/100 mL premix        2 g 200 mL/hr over 30 Minutes Intravenous  Once 12/18/22 1738 12/19/22 1141   09/17/22 1200  vancomycin (VANCOREADY) IVPB 1250 mg/250 mL  Status:  Discontinued        1,250 mg 166.7 mL/hr over 90 Minutes Intravenous Every 24 hours 09/16/22 1040 09/22/22 1139   09/16/22 1200  vancomycin (VANCOREADY) IVPB 1250 mg/250 mL        1,250 mg 166.7 mL/hr over 90 Minutes Intravenous  Once 09/15/22 1706 09/16/22 1255   09/15/22 1200  ceFEPIme (MAXIPIME) 2 g in sodium chloride 0.9 % 100 mL IVPB  Status:  Discontinued        2 g 200 mL/hr over 30 Minutes Intravenous Every 8 hours 09/15/22 1024 09/18/22 1026   09/15/22 1200  vancomycin (VANCOREADY) IVPB 2000 mg/400 mL        2,000 mg 200 mL/hr over 120 Minutes Intravenous  Once 09/15/22 1024 09/15/22 1500   03/04/22 2200  piperacillin-tazobactam (ZOSYN) IVPB 3.375 g        3.375 g 12.5 mL/hr over 240 Minutes Intravenous Every 8 hours 03/04/22 2005 03/18/22 0248   02/21/22 1500  vancomycin (VANCOREADY) IVPB 1250 mg/250 mL  Status:  Discontinued        1,250 mg 166.7 mL/hr over 90 Minutes Intravenous Every 24  hours 02/21/22 1111 02/24/22 1336   02/21/22 1400  meropenem (MERREM) 1 g in sodium chloride 0.9 % 100 mL IVPB        1 g 200 mL/hr over 30 Minutes Intravenous Every 8 hours 02/21/22 1258 02/27/22 2157   02/20/22 1500  vancomycin (VANCOCIN) IVPB 1000 mg/200 mL premix  Status:  Discontinued        1,000 mg 200 mL/hr over 60 Minutes Intravenous Every 24 hours 02/19/22 1706 02/21/22 1111   02/19/22 1115  vancomycin (VANCOREADY) IVPB 2000 mg/400 mL        2,000 mg 200 mL/hr over 120 Minutes Intravenous  Once 02/19/22 1025 02/19/22 1732   02/19/22 1115  fluconazole (DIFLUCAN) IVPB 400 mg        400 mg 100 mL/hr over 120 Minutes Intravenous Every 24 hours 02/19/22 1025 03/04/22 1301   02/15/22 0730  piperacillin-tazobactam (ZOSYN) IVPB 3.375 g  Status:  Discontinued        3.375 g 12.5 mL/hr over 240 Minutes Intravenous Every 8 hours 02/15/22 0639 02/21/22 1235   02/15/22 0655  piperacillin-tazobactam (ZOSYN) 3.375 GM/50ML IVPB       Note to Pharmacy: Loretha Stapler N: cabinet override      02/15/22 0655 02/15/22 0816   02/13/22 2200  cefoTEtan (CEFOTAN) 2 g in sodium chloride 0.9 % 100 mL IVPB  Status:  Discontinued        2 g 200 mL/hr over 30 Minutes Intravenous Every 12 hours 02/13/22 1541 02/15/22 0639   02/13/22 1345  cefoTEtan (CEFOTAN) 2 g in sodium chloride 0.9 % 100 mL IVPB        2 g 200 mL/hr over 30 Minutes Intravenous  Once 02/13/22 1331 02/13/22 2146   02/13/22 1336  sodium chloride 0.9 % with cefoTEtan (CEFOTAN) ADS  Med       Note to Pharmacy: Gates Rigg: cabinet override      02/13/22 1336 02/14/22 0144   02/13/22 0620  sodium chloride 0.9 % with cefoTEtan (CEFOTAN) ADS Med       Note to Pharmacy: Agnes Lawrence A: cabinet override      02/13/22 0620 02/13/22 0802   02/13/22 0600  cefoTEtan (CEFOTAN) 2 g in sodium chloride 0.9 % 100 mL IVPB        2 g 200 mL/hr over 30 Minutes Intravenous On call to O.R. 02/12/22 2229 02/13/22 2146              Family  Communication/Anticipated D/C date and plan/Code Status   DVT prophylaxis: heparin injection 5,000 Units Start: 05/04/23 1645 SCDs Start: 02/13/22 1542     Code Status: Full Code  Family Communication: Boyfriend at the bedside       Subjective:   Interval events noted.  She is unable to provide any history.  Her daughter and other family members were at the bedside.  Objective:    Vitals:   05/09/23 1200 05/09/23 1300 05/09/23 1400 05/09/23 1500  BP: 126/61 (!) 121/58 126/67 121/64  Pulse:  87 87 88  Resp: 14 13 14 16   Temp: 98.9 F (37.2 C)     TempSrc: Axillary     SpO2:  93% 94% 93%  Weight:      Height:       No data found.   Intake/Output Summary (Last 24 hours) at 05/09/2023 1546 Last data filed at 05/09/2023 1350 Gross per 24 hour  Intake 1821.97 ml  Output 4200 ml  Net -2378.03 ml   Filed Weights   05/07/23 0420 05/08/23 0441 05/09/23 0623  Weight: 108.3 kg 108.4 kg 108.9 kg    Exam:  GEN: NAD SKIN: Warm and dry EYES: No pallor or icterus ENT: MMM CV: RRR PULM: CTA B ABD: soft, obese, NT, +BS CNS: She is more alert today but confused EXT: Edema bilateral upper and lower extremities   Data Reviewed:   I have personally reviewed following labs and imaging studies:  Labs: Labs show the following:   Basic Metabolic Panel: Recent Labs  Lab 05/03/23 0407 05/03/23 1454 05/04/23 0325 05/04/23 1046 05/05/23 0730 05/06/23 0903 05/07/23 0414 05/09/23 0840  NA 152*   < > 149*  150* 150* 148* 150* 147* 155*  K 3.2*   < > 4.6  --  4.0 3.8 4.0 4.3  CL 119*   < > 118*  --  119* 118* 112* 120*  CO2 27   < > 24  --  24 27 26 27   GLUCOSE 226*   < > 121*  --  148* 180* 135* 204*  BUN 36*   < > 44*  --  51* 59* 62* 54*  CREATININE 0.78   < > 1.05*  --  1.38* 1.28* 1.31* 0.82  CALCIUM 8.3*   < > 7.9*  --  7.4* 7.8* 7.6* 8.1*  MG 2.4  --  2.2  --   --   --   --  2.8*  PHOS 2.2*  --  4.3  --   --   --   --  2.3*   < > = values in this interval  not displayed.   GFR Estimated Creatinine Clearance: 81 mL/min (by C-G formula based on SCr of 0.82 mg/dL). Liver Function Tests: Recent Labs  Lab 05/03/23 2154 05/04/23 0325 05/09/23 0840  AST  72* 79*  --   ALT 74* 75*  --   ALKPHOS 192* 194*  --   BILITOT 3.0* 3.2*  --   PROT 5.1* 4.9*  --   ALBUMIN 2.3* 2.1* 1.8*   No results for input(s): "LIPASE", "AMYLASE" in the last 168 hours. No results for input(s): "AMMONIA" in the last 168 hours. Coagulation profile Recent Labs  Lab 05/03/23 0407 05/03/23 2154  INR 1.5* 1.5*    CBC: Recent Labs  Lab 05/03/23 0407 05/04/23 0325 05/05/23 0730 05/05/23 1957 05/06/23 0903 05/07/23 0414 05/09/23 0754  WBC 7.9 11.2* 5.9  --  5.8 6.5 4.7  NEUTROABS 5.6 8.3*  --   --   --   --   --   HGB 7.8* 8.2* 7.5* 9.7* 10.1* 9.0* 9.5*  HCT 23.8* 26.2* 24.9* 30.5* 30.9* 28.8* 30.6*  MCV 89.1 93.2 98.0  --  90.6 94.1 96.2  PLT 46* 46* 45*  --  46* 47* 56*   Cardiac Enzymes: No results for input(s): "CKTOTAL", "CKMB", "CKMBINDEX", "TROPONINI" in the last 168 hours. BNP (last 3 results) No results for input(s): "PROBNP" in the last 8760 hours. CBG: Recent Labs  Lab 05/08/23 1643 05/08/23 2023 05/09/23 0007 05/09/23 0750 05/09/23 1153  GLUCAP 180* 151* 138* 152* 141*   D-Dimer: No results for input(s): "DDIMER" in the last 72 hours.  Hgb A1c: No results for input(s): "HGBA1C" in the last 72 hours. Lipid Profile: No results for input(s): "CHOL", "HDL", "LDLCALC", "TRIG", "CHOLHDL", "LDLDIRECT" in the last 72 hours.  Thyroid function studies: No results for input(s): "TSH", "T4TOTAL", "T3FREE", "THYROIDAB" in the last 72 hours.  Invalid input(s): "FREET3" Anemia work up: No results for input(s): "VITAMINB12", "FOLATE", "FERRITIN", "TIBC", "IRON", "RETICCTPCT" in the last 72 hours. Sepsis Labs: Recent Labs  Lab 05/03/23 0407 05/04/23 0325 05/05/23 0730 05/06/23 0903 05/07/23 0414 05/09/23 0754  PROCALCITON 2.10  --    --   --   --   --   WBC 7.9   < > 5.9 5.8 6.5 4.7   < > = values in this interval not displayed.    Microbiology Recent Results (from the past 240 hour(s))  Culture, blood (Routine X 2) w Reflex to ID Panel     Status: None   Collection Time: 05/04/23  3:59 AM   Specimen: BLOOD LEFT ARM  Result Value Ref Range Status   Specimen Description BLOOD LEFT ARM  Final   Special Requests   Final    BOTTLES DRAWN AEROBIC AND ANAEROBIC Blood Culture results may not be optimal due to an inadequate volume of blood received in culture bottles   Culture   Final    NO GROWTH 5 DAYS Performed at [facility], [address] [place], [place] [address]    Report Status 05/09/2023 FINAL  Final  Culture, blood (Routine X 2) w Reflex to ID Panel     Status: None   Collection Time: 05/04/23  3:59 AM   Specimen: BLOOD LEFT HAND  Result Value Ref Range Status   Specimen Description BLOOD LEFT HAND  Final   Special Requests   Final    BOTTLES DRAWN AEROBIC ONLY Blood Culture results may not be optimal due to an inadequate volume of blood received in culture bottles   Culture   Final    NO GROWTH 5 DAYS Performed at [facility], [address] [place], [place] [address]    Report Status 05/09/2023 FINAL  Final  Procedures and diagnostic studies:  No results found.             LOS: 450 days   Desma Wilkowski  [employer] loss adjuster on [email or web address]. If 7PM-7AM, please contact night-coverage at [email or web address]     05/09/2023, 3:46 PM

## 2023-05-09 NOTE — Progress Notes (Signed)
Pt blood sugar at 159. Glucometer not sinking yet. Will contineu to monitor.

## 2023-05-09 NOTE — Progress Notes (Signed)
Made Dr. Everlene Farrier aware sodium is elevated today at 155

## 2023-05-09 NOTE — Progress Notes (Signed)
05/09/2023  Subjective: Patient is 11 Days Post-Op.  No acute events overnight.  This morning patient is more awake and able to talk a bit with me.  Explained to her the current issue with redeveloping fistula/leak from her bowel.  Output remains high.  NG tube to suction, [name] pouch to suction, drains to bulb suction.  Drains and pouch showing dark bilious fluid consistent with bowel contents.  Vital signs: Temp:  [97.9 F (36.6 C)-100.9 F (38.3 C)] 98.4 F (36.9 C) (08/31 1000) Pulse Rate:  [72-87] 73 (08/31 0900) Resp:  [13-20] 17 (08/31 1000) BP: (90-119)/(36-63) 119/51 (08/31 1000) SpO2:  [90 %-97 %] 94 % (08/31 0900) Weight:  [108.9 kg] 108.9 kg (08/31 2956)   Intake/Output: 08/30 0701 - 08/31 0700 In: 1877.3 [I.V.:1366.3; NG/GT:80; IV Piggyback:431] Out: 2130 [Urine:2300; Emesis/NG output:500; Drains:505] Last BM Date : 05/09/23  Physical Exam: Constitutional: No acute distress. Abdomen:  soft, non-distended, appropriately tender.  Her midline wound is open, with mesh exposed, with [name] pouch in place with red rubber catheter for suction.  Right drain within the subcutaneous space with high output of dark bilious fluid.  Left drain within the pelvis with low output of same quality.  Labs:  Recent Labs    05/07/23 0414 05/09/23 0754  WBC 6.5 4.7  HGB 9.0* 9.5*  HCT 28.8* 30.6*  PLT 47* 56*   Recent Labs    05/07/23 0414 05/09/23 0840  NA 147* 155*  K 4.0 4.3  CL 112* 120*  CO2 26 27  GLUCOSE 135* 204*  BUN 62* 54*  CREATININE 1.31* 0.82  CALCIUM 7.6* 8.1*   No results for input(s): "LABPROT", "INR" in the last 72 hours.  Imaging: No results found.  Assessment/Plan: This is a 57 y.o. 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  --Discussed with the patient that now she has a new fistula forming.  Cannot be certain of the location/origin, but would assume the anastomosis.   On CT scan, the anastomosis lies just deep to the mesh in the low abdomen.  Currently the output volume is still high.  Would continue octreotide, NPO, NG to suction, TPN at goal rate, IV abx given the leak.   --Discussed with the patient that going back to the OR would not be a good idea at this point.  She's still under a lot of stress in the ICU recovering from her last surgery 11 days ago, and adding more stress can result in further issues.  Also, the tissues are going to be more inflamed that attempting any repair or sutures are most likely to fail and potentially lead to more fistulas.  For now, will have to monitor and allow the fistula to mature. --Would keep her in ICU/stepdown still given still some altered mentation, high support needs.   Howie Ill, MD [facility]

## 2023-05-10 LAB — BASIC METABOLIC PANEL
Anion gap: 9 (ref 5–15)
BUN: 41 mg/dL — ABNORMAL HIGH (ref 6–20)
CO2: 26 mmol/L (ref 22–32)
Calcium: 8.1 mg/dL — ABNORMAL LOW (ref 8.9–10.3)
Chloride: 122 mmol/L — ABNORMAL HIGH (ref 98–111)
Creatinine, Ser: 0.62 mg/dL (ref 0.44–1.00)
GFR, Estimated: >60 mL/min (ref >60)
Glucose, Bld: 194 mg/dL — ABNORMAL HIGH (ref 70–99)
Potassium: 4.3 mmol/L (ref 3.5–5.1)
Sodium: 157 mmol/L — ABNORMAL HIGH (ref 135–145)

## 2023-05-10 LAB — GLUCOSE, CAPILLARY
Glucose-Capillary: 135 mg/dL — ABNORMAL HIGH (ref 70–99)
Glucose-Capillary: 164 mg/dL — ABNORMAL HIGH (ref 70–99)
Glucose-Capillary: 195 mg/dL — ABNORMAL HIGH (ref 70–99)
Glucose-Capillary: 70 mg/dL (ref 70–99)

## 2023-05-10 LAB — SODIUM: Sodium: 143 mmol/L (ref 135–145)

## 2023-05-10 MED ORDER — INFUVITE ADULT IV INJ
INJECTION | INTRAVENOUS | Status: DC
Start: 2023-05-10 — End: 2023-05-10

## 2023-05-10 MED ORDER — ZINC CHLORIDE 1 MG/ML IV SOLN
INTRAVENOUS | Status: AC
  Filled 2023-05-10: qty 707.2

## 2023-05-10 MED ORDER — KETOROLAC TROMETHAMINE 30 MG/ML IJ SOLN
15.0000 mg | Freq: Four times a day (QID) | INTRAMUSCULAR | Status: DC | PRN
  Administered 2023-05-10: 15 mg via INTRAVENOUS
  Filled 2023-05-10: qty 1

## 2023-05-10 MED ORDER — LOPERAMIDE HCL 2 MG PO CAPS
4.0000 mg | ORAL_CAPSULE | Freq: Two times a day (BID) | ORAL | Status: DC
  Administered 2023-05-10 – 2023-05-11 (×4): 4 mg via ORAL
  Filled 2023-05-10 (×4): qty 2

## 2023-05-10 NOTE — Plan of Care (Signed)
  Problem: Clinical Measurements: Goal: Respiratory complications will improve Outcome: Progressing   Problem: Cardiac: Goal: Ability to maintain an adequate cardiac output will improve Outcome: Progressing   Problem: Nutrition: Goal: Adequate nutrition will be maintained Outcome: Not Progressing   Problem: Activity: Goal: Risk for activity intolerance will decrease Outcome: Not Progressing   Problem: Nutrition: Goal: Adequate nutrition will be maintained Outcome: Not Progressing

## 2023-05-10 NOTE — Progress Notes (Signed)
Progress Note    Debra [name]  WUJ:[id number] DOB: 1966/03/24  DOA: 02/13/2022 PCP: Hillery Aldo, MD      Brief Narrative:    Medical records reviewed and are as summarized below:  Debra [name] is a 57 y.o. 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 with recurrent disease at anterior pelvis next to the anastomosis which was complicated by high output enterocutaneous fistula , and laparotomy excision of greater omental mass, abdominal wall reconstruction with [name], appendectomy and placement of Prevena vacuum on 06/08, complicated with small bowel perforation, underwent reopening of laparotomy and repair of small bowel perforation, high output enterocutaneous fistula, on chronic TPN since April this year, has had a prolonged hospital stay, complicated with right IJ Port-A-Cath Enterococcus infection which was removed and a new PICC line was placed recently.  Hospitalist team was asked to manage patient's hyperglycemia and coagulopathy.     Significant hospital events:  02/13/22: initial laparotomy, excision of greater omental mass, abdominal wall reconstruction with [name], appendectomy, and placement of Prevena vac  02/15/22: EXPLORATORY LAPAROTOMY REPAIR OF BOWEL PERFORATION, INSERTION OF MESH, APPLICATION OF WOUND VAC 02/26/22:  COLON RESECTION SIGMOID, HERNIA REPAIR VENTRAL, APPENDECTOMY, INSERTION OF MESH, REPAIR ABDOMINAL WALL 07/15/22: Neuro surgery consultation due to L1 compression fracture. Not a candidate for lumbosacral orthotic. 12/19/22: Insertion of Port-a-cath 04/06/23: removal of port-a-cath Throughout stay patient has required TPN, WOC & ID consultation.     Recent hospital events  04/28/23: 12 hours and surgery for abdominal wall reconstruction, recurrent incisional hernia repair, partial gastrectomy, small bowel resection, repair enterotomy x 3 and wound VAC  placement.  Patient extubated postop but too lethargic to maintain airway requiring reintubation and transferred to ICU for postoperative mechanical ventilatory management. 04/29/23: Pt remains mechanically intubated FiO2 40%/PEEP 5.  Pt awake and with purposeful movement on precedex gtt.  Will perform SBT with plans for possible extubation  04/30/23: hospitalist consulted for hyperglycemia and coagulopathy. Overnight fever to 101.8, tachycardic, WBC WNL but concern for SIRS/sepsis 08/23: pro calcitonin (+), starting tx for HCAP. MRSA resp screen recently neg 08/20, will get sputum cx. Echo EF 60-65%, G1DD 08/24: hypernatremic, inflammatory markers up (ferritin high, ALP high), still septic/SIRS, escalating abx, repeat CXR, very cautious fluid administration to help reduce sodium and if increased SOB would d/c fluids and repeat CXR, increased insulin, repeat albumin  08/25: Tachycardia has improved, still tachypnea but no longer meeting sepsis/sirs criteria. Sodium about same 152, Hgb stable. Relatively low UOsm 444 c/w osmotic diuresis (get Glc controlled) / loop diuretics (d/c Lasix) / ADH resistance (correct potassium though hypokalemia is not severe, protein malnutrition is most likely greater factor). Gentle hydration and will need to d/w dietary/pharmacy but malnutrition is going to be a persistent issue. Still substantial fluid loss via NG 08/26: overnight, increased confusion, temp 100.9-101.1, tachycardic, wound vac drainage increased, transferred back to stepdown, switch cefepime + flagyl to Zosyn, continue vanc, follow BCx, neuro-checks, given lasix, CT head no acute process, CT abd/pel no abscess abdominal and multifocal lung infiltrate, CTA chest to r/o PE / eval pneumonia/effusion --> no PE, (+)pulmonary edema. WIll hold fluids and try diuresis tonight, hopefully sodium and BP will hold, signed otu to night team in ICU in case needing pressors 08/27: stable this morning, sodium slightly better,  HH worse, will monitor closely, n oUOP documentd, follow  closely .        Assessment/Plan:  Principal Problem:   Enterocutaneous fistula Active Problems:   S/P hernia repair   Closed wedge compression fracture of first lumbar vertebra (HCC)   Recurrent ventral hernia   Arm swelling   Acute respiratory failure with hypoxia (HCC)   Acute metabolic encephalopathy   HCAP (healthcare-associated pneumonia)   Acute pulmonary edema (HCC)   Nutrition Problem: Increased nutrient needs Etiology: wound healing, catabolic illness  Signs/Symptoms: estimated needs   Body mass index is 48.58 kg/m.  (Morbid obesity)      Anasarca/Edema generalized  Hypoalbuminemia seems more likely than CHF but she does have G1 diastolic df  Lasix held because of AKI and hypernatremia S/p treatment with IV albumin 2D echo on 04/30/2023 showed EF estimated 6065%, grade 1 diastolic dysfunction   HCAP Meeting sepsis criteria overnight 08/22-08/24 w/ tachycardia and fever (+)procalcitonin 7 days of antibiotics is adequate for treatment of pneumonia.    Hypernatremia Worsening hypernatremia.  Sodium up from 155-157.  Increase 5% dextrose infusion from 100 mL/h to 150 mL/h.  Monitor sodium level closely and adjust IV dextrose infusion as needed.  Pharmacist will decrease sodium concentration in TPN. Discussed with Dr. Aleen Campi, general surgeon.   Hypokalemia Improved    Acute hypoxic respiratory failure  Continue 4 L/min oxygen and taper off oxygen as able.     AKI  Improved.  Avoid nephrotoxins.   Acute metabolic encephalopathy Continue supportive care   Prediabetes with hyperglycemia Most recent A1c 6.2 indicating a condition of prediabetes at baseline Hyperglycemia likely exacerbated by TPN Continue insulin glargine and NovoLog   Coagulopathy Mild, chronic, review patient's record showed she has a chronic elevation of INR, at baseline 1.2-1.4 and per patient granddaughter,  patient has no known history of easy bleeding, no SLE history in the family.  CT abdomen showed no signs of liver cirrhosis or NASH. Getting vitamin K w/ TPN.  recommend checking INR periodically and before surgery     Chronic iron deficiency anemia D/t malabsorption. Recent Iron WNL, TIBC low, ferritin high d/t inflammation H&H is stable   Thrombocytopenia It appears patient has chronic thrombocytopenia.  Monitor platelet count    s/p exploratory laparotomy, extensive lysis of adhesion, 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  Fever She is on IV Unasyn. Follow-up with general surgeon   Diet Order             Diet NPO time specified Except for: Ice Chips, Sips with Meds  Diet effective now                             Medications:    sodium chloride   Intravenous Once   alteplase  2 mg Intracatheter Once   Chlorhexidine Gluconate Cloth  6 each Topical Daily   heparin injection (subcutaneous)  5,000 Units Subcutaneous Q8H   insulin aspart  0-20 Units Subcutaneous Q4H   insulin aspart  4 Units Subcutaneous Q4H   insulin glargine-yfgn  22 Units Subcutaneous Daily   levothyroxine  137 mcg Per Tube Q0600   lidocaine  2 patch Transdermal Q24H   loperamide  4 mg Oral BID   octreotide  100 mcg Subcutaneous Q8H   pantoprazole (PROTONIX) IV  40 mg Intravenous Q12H   pregabalin  200 mg Per Tube TID   sodium chloride flush  10-40 mL Intracatheter Q12H   Continuous Infusions:  acetaminophen 1,000 mg (  05/10/23 1041)   ampicillin-sulbactam (UNASYN) IV 3 g (05/10/23 1200)   dextrose 150 mL/hr at 05/10/23 1200   methocarbamol (ROBAXIN) IV     TPN ADULT (ION) 65 mL/hr at 05/10/23 0538   TPN ADULT (ION)       Anti-infectives (From admission, onward)    Start     Dose/Rate Route Frequency Ordered Stop   05/08/23 0600  Ampicillin-Sulbactam  (UNASYN) 3 g in sodium chloride 0.9 % 100 mL IVPB        3 g 200 mL/hr over 30 Minutes Intravenous Every 6 hours 05/07/23 0949 05/14/23 2359   05/05/23 0807  vancomycin variable dose per unstable renal function (pharmacist dosing)  Status:  Discontinued         Does not apply See admin instructions 05/05/23 0807 05/05/23 0823   05/04/23 1800  vancomycin (VANCOCIN) IVPB 1000 mg/200 mL premix  Status:  Discontinued        1,000 mg 200 mL/hr over 60 Minutes Intravenous Every 24 hours 05/04/23 0755 05/05/23 0807   05/04/23 0600  piperacillin-tazobactam (ZOSYN) IVPB 3.375 g        3.375 g 12.5 mL/hr over 240 Minutes Intravenous Every 8 hours 05/04/23 0236 05/08/23 0158   05/03/23 1800  vancomycin (VANCOCIN) IVPB 1000 mg/200 mL premix  Status:  Discontinued        1,000 mg 200 mL/hr over 60 Minutes Intravenous Every 24 hours 05/02/23 1530 05/03/23 0823   05/03/23 1800  vancomycin (VANCOREADY) IVPB 1250 mg/250 mL  Status:  Discontinued        1,250 mg 166.7 mL/hr over 90 Minutes Intravenous Every 24 hours 05/03/23 0823 05/03/23 1752   05/03/23 1800  Vancomycin (VANCOCIN) 1,250 mg in sodium chloride 0.9 % 250 mL IVPB  Status:  Discontinued        1,250 mg 166.7 mL/hr over 90 Minutes Intravenous Every 24 hours 05/03/23 1752 05/04/23 0755   05/02/23 2000  ceFEPIme (MAXIPIME) 2 g in sodium chloride 0.9 % 100 mL IVPB  Status:  Discontinued        2 g 200 mL/hr over 30 Minutes Intravenous Every 8 hours 05/02/23 1530 05/04/23 0236   05/02/23 1630  vancomycin (VANCOREADY) IVPB 2000 mg/400 mL        2,000 mg 200 mL/hr over 120 Minutes Intravenous  Once 05/02/23 1530 05/02/23 1746   05/02/23 1600  metroNIDAZOLE (FLAGYL) IVPB 500 mg  Status:  Discontinued        500 mg 100 mL/hr over 60 Minutes Intravenous Every 12 hours 05/02/23 1453 05/04/23 0236   05/01/23 1400  azithromycin (ZITHROMAX) 500 mg in sodium chloride 0.9 % 250 mL IVPB  Status:  Discontinued        500 mg 250 mL/hr over 60 Minutes  Intravenous Every 24 hours 05/01/23 1255 05/02/23 1453   05/01/23 1230  azithromycin (ZITHROMAX) tablet 500 mg  Status:  Discontinued        500 mg Oral Daily 05/01/23 1135 05/01/23 1255   05/01/23 1200  cefTRIAXone (ROCEPHIN) 2 g in sodium chloride 0.9 % 100 mL IVPB  Status:  Discontinued        2 g 200 mL/hr over 30 Minutes Intravenous Every 24 hours 05/01/23 1135 05/02/23 1453   04/28/23 2200  cefoTEtan (CEFOTAN) 2 g in sodium chloride 0.9 % 100 mL IVPB        2 g 200 mL/hr over 30 Minutes Intravenous Every 8 hours 04/28/23 2058 04/29/23 1717   04/27/23 0945  cefoTEtan (  CEFOTAN) 2 g in sodium chloride 0.9 % 100 mL IVPB        2 g 200 mL/hr over 30 Minutes Intravenous On call to O.R. 04/27/23 0856 04/28/23 0559   04/05/23 0900  cefTRIAXone (ROCEPHIN) 2 g in sodium chloride 0.9 % 100 mL IVPB        2 g 200 mL/hr over 30 Minutes Intravenous Every 24 hours 04/05/23 0634 04/14/23 1254   12/19/22 1000  ceFAZolin (ANCEF) IVPB 2g/100 mL premix        2 g 200 mL/hr over 30 Minutes Intravenous  Once 12/18/22 1738 12/19/22 1141   09/17/22 1200  vancomycin (VANCOREADY) IVPB 1250 mg/250 mL  Status:  Discontinued        1,250 mg 166.7 mL/hr over 90 Minutes Intravenous Every 24 hours 09/16/22 1040 09/22/22 1139   09/16/22 1200  vancomycin (VANCOREADY) IVPB 1250 mg/250 mL        1,250 mg 166.7 mL/hr over 90 Minutes Intravenous  Once 09/15/22 1706 09/16/22 1255   09/15/22 1200  ceFEPIme (MAXIPIME) 2 g in sodium chloride 0.9 % 100 mL IVPB  Status:  Discontinued        2 g 200 mL/hr over 30 Minutes Intravenous Every 8 hours 09/15/22 1024 09/18/22 1026   09/15/22 1200  vancomycin (VANCOREADY) IVPB 2000 mg/400 mL        2,000 mg 200 mL/hr over 120 Minutes Intravenous  Once 09/15/22 1024 09/15/22 1500   03/04/22 2200  piperacillin-tazobactam (ZOSYN) IVPB 3.375 g        3.375 g 12.5 mL/hr over 240 Minutes Intravenous Every 8 hours 03/04/22 2005 03/18/22 0248   02/21/22 1500  vancomycin (VANCOREADY) IVPB  1250 mg/250 mL  Status:  Discontinued        1,250 mg 166.7 mL/hr over 90 Minutes Intravenous Every 24 hours 02/21/22 1111 02/24/22 1336   02/21/22 1400  meropenem (MERREM) 1 g in sodium chloride 0.9 % 100 mL IVPB        1 g 200 mL/hr over 30 Minutes Intravenous Every 8 hours 02/21/22 1258 02/27/22 2157   02/20/22 1500  vancomycin (VANCOCIN) IVPB 1000 mg/200 mL premix  Status:  Discontinued        1,000 mg 200 mL/hr over 60 Minutes Intravenous Every 24 hours 02/19/22 1706 02/21/22 1111   02/19/22 1115  vancomycin (VANCOREADY) IVPB 2000 mg/400 mL        2,000 mg 200 mL/hr over 120 Minutes Intravenous  Once 02/19/22 1025 02/19/22 1732   02/19/22 1115  fluconazole (DIFLUCAN) IVPB 400 mg        400 mg 100 mL/hr over 120 Minutes Intravenous Every 24 hours 02/19/22 1025 03/04/22 1301   02/15/22 0730  piperacillin-tazobactam (ZOSYN) IVPB 3.375 g  Status:  Discontinued        3.375 g 12.5 mL/hr over 240 Minutes Intravenous Every 8 hours 02/15/22 0639 02/21/22 1235   02/15/22 0655  piperacillin-tazobactam (ZOSYN) 3.375 GM/50ML IVPB       Note to Pharmacy: Loretha Stapler N: cabinet override      02/15/22 0655 02/15/22 0816   02/13/22 2200  cefoTEtan (CEFOTAN) 2 g in sodium chloride 0.9 % 100 mL IVPB  Status:  Discontinued        2 g 200 mL/hr over 30 Minutes Intravenous Every 12 hours 02/13/22 1541 02/15/22 0639   02/13/22 1345  cefoTEtan (CEFOTAN) 2 g in sodium chloride 0.9 % 100 mL IVPB        2 g 200 mL/hr  over 30 Minutes Intravenous  Once 02/13/22 1331 02/13/22 2146   02/13/22 1336  sodium chloride 0.9 % with cefoTEtan (CEFOTAN) ADS Med       Note to Pharmacy: Gates Rigg: cabinet override      02/13/22 1336 02/14/22 0144   02/13/22 0620  sodium chloride 0.9 % with cefoTEtan (CEFOTAN) ADS Med       Note to Pharmacy: Agnes Lawrence A: cabinet override      02/13/22 0620 02/13/22 0802   02/13/22 0600  cefoTEtan (CEFOTAN) 2 g in sodium chloride 0.9 % 100 mL IVPB        2 g 200 mL/hr  over 30 Minutes Intravenous On call to O.R. 02/12/22 2229 02/13/22 2146              Family Communication/Anticipated D/C date and plan/Code Status   DVT prophylaxis: heparin injection 5,000 Units Start: 05/04/23 1645 SCDs Start: 02/13/22 1542     Code Status: Full Code        Subjective:   Interval events noted.  Patient had fever with Tmax of 100.8 F this morning.  She is unable to provide any history because she is sleepy and confused  Objective:    Vitals:   05/10/23 1100 05/10/23 1200 05/10/23 1300 05/10/23 1400  BP: (!) 103/46 (!) 91/47 (!) 92/26 (!) 118/53  Pulse: 97 94 91 97  Resp: 17 16 17  (!) 21  Temp:  99.1 F (37.3 C)    TempSrc:  Axillary    SpO2: 96% 92% 91% 91%  Weight:      Height:       No data found.   Intake/Output Summary (Last 24 hours) at 05/10/2023 1510 Last data filed at 05/10/2023 1420 Gross per 24 hour  Intake 2703.49 ml  Output 3155 ml  Net -451.51 ml   Filed Weights   05/08/23 0441 05/09/23 0623 05/10/23 0428  Weight: 108.4 kg 108.9 kg 109.1 kg    Exam:  GEN: NAD SKIN: Warm and dry EYES: No pallor or icterus ENT: MMM CV: RRR PULM: CTA B ABD: soft, obese, NT, +BS, + large open surgical abdominal wound CNS: Drowsy but arousable, confused EXT: Edema of upper and lower extremities.    Data Reviewed:   I have personally reviewed following labs and imaging studies:  Labs: Labs show the following:   Basic Metabolic Panel: Recent Labs  Lab 05/04/23 0325 05/04/23 1046 05/05/23 0730 05/06/23 0903 05/07/23 0414 05/09/23 0840 05/10/23 0359  NA 149*  150*   < > 148* 150* 147* 155* 157*  K 4.6  --  4.0 3.8 4.0 4.3 4.3  CL 118*  --  119* 118* 112* 120* 122*  CO2 24  --  24 27 26 27 26   GLUCOSE 121*  --  148* 180* 135* 204* 194*  BUN 44*  --  51* 59* 62* 54* 41*  CREATININE 1.05*  --  1.38* 1.28* 1.31* 0.82 0.62  CALCIUM 7.9*  --  7.4* 7.8* 7.6* 8.1* 8.1*  MG 2.2  --   --   --   --  2.8*  --   PHOS 4.3  --    --   --   --  2.3*  --    < > = values in this interval not displayed.   GFR Estimated Creatinine Clearance: 83.2 mL/min (by C-G formula based on SCr of 0.62 mg/dL). Liver Function Tests: Recent Labs  Lab 05/03/23 2154 05/04/23 0325 05/09/23 0840  AST 72* 79*  --  ALT 74* 75*  --   ALKPHOS 192* 194*  --   BILITOT 3.0* 3.2*  --   PROT 5.1* 4.9*  --   ALBUMIN 2.3* 2.1* 1.8*   No results for input(s): "LIPASE", "AMYLASE" in the last 168 hours. No results for input(s): "AMMONIA" in the last 168 hours. Coagulation profile Recent Labs  Lab 05/03/23 2154  INR 1.5*    CBC: Recent Labs  Lab 05/04/23 0325 05/05/23 0730 05/05/23 1957 05/06/23 0903 05/07/23 0414 05/09/23 0754  WBC 11.2* 5.9  --  5.8 6.5 4.7  NEUTROABS 8.3*  --   --   --   --   --   HGB 8.2* 7.5* 9.7* 10.1* 9.0* 9.5*  HCT 26.2* 24.9* 30.5* 30.9* 28.8* 30.6*  MCV 93.2 98.0  --  90.6 94.1 96.2  PLT 46* 45*  --  46* 47* 56*   Cardiac Enzymes: No results for input(s): "CKTOTAL", "CKMB", "CKMBINDEX", "TROPONINI" in the last 168 hours. BNP (last 3 results) No results for input(s): "PROBNP" in the last 8760 hours. CBG: Recent Labs  Lab 05/09/23 0750 05/09/23 1153 05/09/23 1555 05/09/23 2000 05/10/23 0005  GLUCAP 152* 141* 131* 147* 164*   D-Dimer: No results for input(s): "DDIMER" in the last 72 hours.  Hgb A1c: No results for input(s): "HGBA1C" in the last 72 hours. Lipid Profile: No results for input(s): "CHOL", "HDL", "LDLCALC", "TRIG", "CHOLHDL", "LDLDIRECT" in the last 72 hours.  Thyroid function studies: No results for input(s): "TSH", "T4TOTAL", "T3FREE", "THYROIDAB" in the last 72 hours.  Invalid input(s): "FREET3" Anemia work up: No results for input(s): "VITAMINB12", "FOLATE", "FERRITIN", "TIBC", "IRON", "RETICCTPCT" in the last 72 hours. Sepsis Labs: Recent Labs  Lab 05/05/23 0730 05/06/23 0903 05/07/23 0414 05/09/23 0754  WBC 5.9 5.8 6.5 4.7    Microbiology Recent Results  (from the past 240 hour(s))  Culture, blood (Routine X 2) w Reflex to ID Panel     Status: None   Collection Time: 05/04/23  3:59 AM   Specimen: BLOOD LEFT ARM  Result Value Ref Range Status   Specimen Description BLOOD LEFT ARM  Final   Special Requests   Final    BOTTLES DRAWN AEROBIC AND ANAEROBIC Blood Culture results may not be optimal due to an inadequate volume of blood received in culture bottles   Culture   Final    NO GROWTH 5 DAYS Performed at [facility], [address] [place], [place] [address]    Report Status 05/09/2023 FINAL  Final  Culture, blood (Routine X 2) w Reflex to ID Panel     Status: None   Collection Time: 05/04/23  3:59 AM   Specimen: BLOOD LEFT HAND  Result Value Ref Range Status   Specimen Description BLOOD LEFT HAND  Final   Special Requests   Final    BOTTLES DRAWN AEROBIC ONLY Blood Culture results may not be optimal due to an inadequate volume of blood received in culture bottles   Culture   Final    NO GROWTH 5 DAYS Performed at [facility], [address], [place], [place] [address]    Report Status 05/09/2023 FINAL  Final    Procedures and diagnostic studies:  No results found.             LOS: 451 days   Debra [name]  [employer] loss adjuster on [email or web address]. If 7PM-7AM, please contact night-coverage at [email or web address]     05/10/2023, 3:10 PM

## 2023-05-10 NOTE — Progress Notes (Signed)
PHARMACY - TOTAL PARENTERAL NUTRITION CONSULT NOTE   Indication: Prolonged ileus  Patient Measurements: Height: 4\' 11"  (149.9 cm) Weight: 109.1 kg (240 lb 8.4 oz) IBW/kg (Calculated) : 43.2 TPN AdjBW (KG): 59 Body mass index is 48.58 kg/m.  Assessment: Debra [name] is a 57 y.o. female s/p laparotomy, excision of greater omental mass, abdominal wall reconstruction with [name], appendectomy, and placement of Prevena vac.  Glucose / Insulin:  BG last 24h: 138 - 194 SSI last 24h: 21u rSSI q4h + Novolog 4u q4h + Semglee 22u daily + insulin removed from TPN 8/26 Electrolytes: Hypernatremia, hyperchloremia, hypermagnesemia, hypophosphatemia Renal: SCr 0.62, stable (baseline ~ 0.5) Hepatic: Mild, stable transaminitis. 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, partial gastrectomy, abdominal wall reconstruction with mesh placement, and wound vac placement   Central access: PICC 04/09/2023   TPN start date: 04/09/23 TPN from 02/15/2022 to 04/05/2023 - held for bacteremia and TPN restarted 04/09/23 Previously Cyclic TPN, transitioned back to regular TPN on 04/29/23 post surgery  RD Assessment:  Estimated Needs Total Energy Estimated Needs: 1900-2200kcal/day Total Protein Estimated Needs: 95-110g/day Total Fluid Estimated Needs: 1.4-1.6L/day  Current Nutrition: NPO  Plan:  --Continue TPN at new goal rate of 65 mL/hr at  1800 --Electrolytes in TPN (standard): Na 10 mEq/L(decreased on 9/1), K 46mEq/L, Ca 15mEq/L, Mg 64mEq/L(removed on 8/31), and Phos 13mmol/L(increased on 8/31) --Acetate maximized as chloride levels continue to increase --Continue multivitamin, trace elements, zinc, selenium and chromium chloride to TPN --Discussed with MD and will remove insulin from TPN bag. MD will manage glucose via Gloster insulin regimen --Monitor TPN labs daily until stable then on Mon/Thurs  [facility] A Michel Hendon 05/10/2023 8:34 AM

## 2023-05-10 NOTE — Progress Notes (Signed)
Pt NG tube connected to low intermittent suction. This afternoon I noticed bright red  drainage in canister. Dr. Aleen Campi notified. Per MD will d/c toradol and hold subcutaneous heparin tonight.  -1800 pt requesting a letter from MD for a  possibility that her sister might be able to travel to come see her. Dr. Aleen Campi aware.  Pt brother: Carlette Dark Email: [email or web address] Phone [phone]

## 2023-05-10 NOTE — Progress Notes (Signed)
   05/10/23 1300  Spiritual Encounters  Type of Visit Follow up  Care provided to: Pt and family  Reason for visit Routine spiritual support  OnCall Visit Yes   Chaplain provided follow up care to patient and family present. Chaplain attempted to engage in conversation with patient and patient seemed to respond by opening eyes.

## 2023-05-10 NOTE — Progress Notes (Signed)
05/10/2023  Subjective: Patient is 12 Days Post-Op.  No acute events overnight.  Patient did have a mild low-grade temperature of 100.4.  Denies any worsening abdominal pain.  Her sodium continues to increase and is 157 this morning.  She does continue to have high output of GI losses with 1200 mL from her [name] pouch and 680 from her subcutaneous drain.  She was started on D5 water yesterday to help with the hypernatremia.  She continues to be n.p.o., with NG tube suction, and octreotide.  Vital signs: Temp:  [98.8 F (37.1 C)-100.8 F (38.2 C)] 100.8 F (38.2 C) (09/01 1037) Pulse Rate:  [81-97] 97 (09/01 1100) Resp:  [13-24] 17 (09/01 1100) BP: (89-126)/(27-67) 103/46 (09/01 1100) SpO2:  [89 %-96 %] 96 % (09/01 1100) Weight:  [109.1 kg] 109.1 kg (09/01 0428)   Intake/Output: 08/31 0701 - 09/01 0700 In: 3310.1 [I.V.:3010.1; IV Piggyback:300] Out: 3365 [Urine:1425; Drains:740] Last BM Date : 05/09/23  Physical Exam: Constitutional: No acute distress Abdomen:  soft, non-distended, appropriately tender. Her midline wound is open, with mesh exposed, with [name] pouch in place with red rubber catheter for suction. Right drain within the subcutaneous space with high output of light brown fluid. Left drain within the pelvis with low output of same quality.   Labs:  Recent Labs    05/09/23 0754  WBC 4.7  HGB 9.5*  HCT 30.6*  PLT 56*   Recent Labs    05/09/23 0840 05/10/23 0359  NA 155* 157*  K 4.3 4.3  CL 120* 122*  CO2 27 26  GLUCOSE 204* 194*  BUN 54* 41*  CREATININE 0.82 0.62  CALCIUM 8.1* 8.1*   No results for input(s): "LABPROT", "INR" in the last 72 hours.  Imaging: No results found.  Assessment/Plan: This is a 57 y.o. 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  - The patient continues having high output from her fistula.  At this point, we will add Imodium back to her medication  regimen.  She used to be on a high dose of Imodium last year when her fistulas were still high output and they were still epithelializing.  She was as high as 4 mg every 6 hours but will start now with 4 mg twice daily. - Have discussed with pharmacy and Dr. Myriam Forehand about her electrolytes.  Will increase the rate of D5 and decrease the sodium in her TPN. - Continue monitoring drain and pouch output.  Right drain bulb will fill out more easily/quickly during the day so this will need to be emptied more frequently. -Continue IV antibiotics for her pneumonia and leak. - Continue her on stepdown status due to the high amount of care that she is requiring at this point.   Howie Ill, MD [facility]

## 2023-05-10 DEATH — deceased

## 2023-05-11 LAB — COMPREHENSIVE METABOLIC PANEL
ALT: 98 U/L — ABNORMAL HIGH (ref 0–44)
AST: 91 U/L — ABNORMAL HIGH (ref 15–41)
Albumin: 1.7 g/dL — ABNORMAL LOW (ref 3.5–5.0)
Alkaline Phosphatase: 153 U/L — ABNORMAL HIGH (ref 38–126)
Anion gap: 7 (ref 5–15)
BUN: 46 mg/dL — ABNORMAL HIGH (ref 6–20)
CO2: 26 mmol/L (ref 22–32)
Calcium: 7.8 mg/dL — ABNORMAL LOW (ref 8.9–10.3)
Chloride: 117 mmol/L — ABNORMAL HIGH (ref 98–111)
Creatinine, Ser: 0.9 mg/dL (ref 0.44–1.00)
GFR, Estimated: >60 mL/min (ref >60)
Glucose, Bld: 145 mg/dL — ABNORMAL HIGH (ref 70–99)
Potassium: 4.6 mmol/L (ref 3.5–5.1)
Sodium: 150 mmol/L — ABNORMAL HIGH (ref 135–145)
Total Bilirubin: 4.2 mg/dL — ABNORMAL HIGH (ref 0.3–1.2)
Total Protein: 5.1 g/dL — ABNORMAL LOW (ref 6.5–8.1)

## 2023-05-11 LAB — CBC
HCT: 29 % — ABNORMAL LOW (ref 36.0–46.0)
Hemoglobin: 9.1 g/dL — ABNORMAL LOW (ref 12.0–15.0)
MCH: 30.3 pg (ref 26.0–34.0)
MCHC: 31.4 g/dL (ref 30.0–36.0)
MCV: 96.7 fL (ref 80.0–100.0)
Platelets: 46 10*3/uL — ABNORMAL LOW (ref 150–400)
RBC: 3 MIL/uL — ABNORMAL LOW (ref 3.87–5.11)
RDW: 24.7 % — ABNORMAL HIGH (ref 11.5–15.5)
WBC: 3.8 10*3/uL — ABNORMAL LOW (ref 4.0–10.5)
nRBC: 4.2 % — ABNORMAL HIGH (ref 0.0–0.2)

## 2023-05-11 LAB — GLUCOSE, CAPILLARY
Glucose-Capillary: 132 mg/dL — ABNORMAL HIGH (ref 70–99)
Glucose-Capillary: 135 mg/dL — ABNORMAL HIGH (ref 70–99)
Glucose-Capillary: 183 mg/dL — ABNORMAL HIGH (ref 70–99)
Glucose-Capillary: 159 mg/dL — ABNORMAL HIGH (ref 70–99)

## 2023-05-11 LAB — BILIRUBIN, DIRECT: Bilirubin, Direct: 2.5 mg/dL — ABNORMAL HIGH (ref 0.0–0.2)

## 2023-05-11 LAB — PROTIME-INR
INR: 1.3 — ABNORMAL HIGH (ref 0.8–1.2)
Prothrombin Time: 16.6 seconds — ABNORMAL HIGH (ref 11.4–15.2)

## 2023-05-11 LAB — PHOSPHORUS: Phosphorus: 3.6 mg/dL (ref 2.5–4.6)

## 2023-05-11 LAB — MAGNESIUM: Magnesium: 2.2 mg/dL (ref 1.7–2.4)

## 2023-05-11 MED ORDER — ZINC CHLORIDE 1 MG/ML IV SOLN
INTRAVENOUS | Status: AC
  Filled 2023-05-11: qty 707.2

## 2023-05-11 MED ORDER — ACETAMINOPHEN 650 MG RE SUPP: 650.0000 mg | Freq: Four times a day (QID) | RECTAL | Status: DC | PRN

## 2023-05-11 MED ORDER — ALBUMIN HUMAN 25 % IV SOLN
12.5000 g | Freq: Once | INTRAVENOUS | Status: AC
  Administered 2023-05-11: 12.5 g via INTRAVENOUS
  Filled 2023-05-11: qty 50

## 2023-05-11 MED ORDER — DEXTROSE 5 % IV SOLN: INTRAVENOUS | Status: AC

## 2023-05-11 NOTE — Progress Notes (Signed)
Dr. Aleen Campi aware of soft bp, per md ordered albumin x1.

## 2023-05-11 NOTE — Progress Notes (Signed)
Progress Note    Debra [name]  ZOX:[id number] DOB: [birth year]  DOA: 02/13/2022 PCP: Debra Aldo, MD      Brief Narrative:    Medical records reviewed and are as summarized below:  Debra [name] is [name] 57 y.o. 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 with recurrent disease at anterior pelvis next to the anastomosis which was complicated by high output enterocutaneous fistula , and laparotomy excision of greater omental mass, abdominal wall reconstruction with [name], appendectomy and placement of Prevena vacuum on 06/08, complicated with small bowel perforation, underwent reopening of laparotomy and repair of small bowel perforation, high output enterocutaneous fistula, on chronic TPN since April this year, has had [name] prolonged hospital stay, complicated with right IJ Port-[name]-Cath Enterococcus infection which was removed and [name] new PICC line was placed recently.  Hospitalist team was asked to manage patient's hyperglycemia and coagulopathy.     Significant hospital events:  02/13/22: initial laparotomy, excision of greater omental mass, abdominal wall reconstruction with [name], appendectomy, and placement of Prevena vac  02/15/22: EXPLORATORY LAPAROTOMY REPAIR OF BOWEL PERFORATION, INSERTION OF MESH, APPLICATION OF WOUND VAC 02/26/22:  COLON RESECTION SIGMOID, HERNIA REPAIR VENTRAL, APPENDECTOMY, INSERTION OF MESH, REPAIR ABDOMINAL WALL 07/15/22: Neuro surgery consultation due to L1 compression fracture. Not [name] candidate for lumbosacral orthotic. 12/19/22: Insertion of Port-[name]-cath 04/06/23: removal of port-[name]-cath Throughout stay patient has required TPN, WOC & ID consultation.     Recent hospital events  04/28/23: 12 hours and surgery for abdominal wall reconstruction, recurrent incisional hernia repair, partial gastrectomy, small bowel resection, repair enterotomy x 3 and wound VAC  placement.  Patient extubated postop but too lethargic to maintain airway requiring reintubation and transferred to ICU for postoperative mechanical ventilatory management. 04/29/23: Pt remains mechanically intubated FiO2 40%/PEEP 5.  Pt awake and with purposeful movement on precedex gtt.  Will perform SBT with plans for possible extubation  04/30/23: hospitalist consulted for hyperglycemia and coagulopathy. Overnight fever to 101.8, tachycardic, WBC WNL but concern for SIRS/sepsis 08/23: pro calcitonin (+), starting tx for HCAP. MRSA resp screen recently neg 08/20, will get sputum cx. Echo EF 60-65%, G1DD 08/24: hypernatremic, inflammatory markers up (ferritin high, ALP high), still septic/SIRS, escalating abx, repeat CXR, very cautious fluid administration to help reduce sodium and if increased SOB would d/c fluids and repeat CXR, increased insulin, repeat albumin  08/25: Tachycardia has improved, still tachypnea but no longer meeting sepsis/sirs criteria. Sodium about same 152, Hgb stable. Relatively low UOsm 444 c/w osmotic diuresis (get Glc controlled) / loop diuretics (d/c Lasix) / ADH resistance (correct potassium though hypokalemia is not severe, protein malnutrition is most likely greater factor). Gentle hydration and will need to d/w dietary/pharmacy but malnutrition is going to be [name] persistent issue. Still substantial fluid loss via NG 08/26: overnight, increased confusion, temp 100.9-101.1, tachycardic, wound vac drainage increased, transferred back to stepdown, switch cefepime + flagyl to Zosyn, continue vanc, follow BCx, neuro-checks, given lasix, CT head no acute process, CT abd/pel no abscess abdominal and multifocal lung infiltrate, CTA chest to r/o PE / eval pneumonia/effusion --> no PE, (+)pulmonary edema. WIll hold fluids and try diuresis tonight, hopefully sodium and BP will hold, signed otu to night team in ICU in case needing pressors 08/27: stable this morning, sodium slightly better,  HH worse, will monitor closely, [name] oUOP documentd, follow  closely .        Assessment/Plan:  Principal Problem:   Enterocutaneous fistula Active Problems:   S/P hernia repair   Closed wedge compression fracture of first lumbar vertebra (HCC)   Recurrent ventral hernia   Arm swelling   Acute respiratory failure with hypoxia (HCC)   Acute metabolic encephalopathy   HCAP (healthcare-associated pneumonia)   Acute pulmonary edema (HCC)   Nutrition Problem: Increased nutrient needs Etiology: wound healing, catabolic illness  Signs/Symptoms: estimated needs   Body mass index is 48.45 kg/m.  (Morbid obesity)      Anasarca/Edema generalized  Hypoalbuminemia Lasix held because of AKI and hypernatremia S/p treatment with IV albumin 2D echo on 04/30/2023 showed EF estimated 60-65%, grade 1 diastolic dysfunction   HCAP Meeting sepsis criteria overnight 08/22-08/24 w/ tachycardia and fever (+)procalcitonin 7 days of antibiotics is adequate for treatment of pneumonia.    Hypernatremia Sodium went down from 157-143 yesterday and is back up to 150 this morning.  Restart IV fluids (5% dextrose infusion at 75 mL/h).  Sodium concentration in TPN has been decreased.   Discussed with Dr. Aleen Campi, general surgeon.   Hypokalemia Improved    Acute hypoxic respiratory failure  Continue 4 L/min oxygen and taper off oxygen as able.     AKI  Improved.  Avoid nephrotoxins.   Acute metabolic encephalopathy Continue supportive care   Prediabetes with hyperglycemia Most recent A1c 6.2 indicating [name] condition of prediabetes at baseline Hyperglycemia likely exacerbated by TPN Continue insulin glargine and NovoLog   Coagulopathy Mild, chronic, review patient's record showed she has [name] chronic elevation of INR, at baseline 1.2-1.4 and per patient granddaughter, patient has no known history of easy bleeding, no SLE history in the family.  CT abdomen showed no signs of liver  cirrhosis or NASH. Getting vitamin K w/ TPN.  recommend checking INR periodically and before surgery     Chronic iron deficiency anemia D/t malabsorption. Recent Iron WNL, TIBC low, ferritin high d/t inflammation H&H is stable   Elevated liver enzymes/mild hyperbilirubinemia This may be due to TPN.  Continue to monitor, No acute liver abnormality noted on CT abdomen.  Thrombocytopenia It appears patient has chronic thrombocytopenia.  Monitor platelet count    s/p exploratory laparotomy, extensive lysis of adhesion, 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  She is on IV Unasyn. Follow-up with general surgeon   Plan discussed with Debra [name], daughter, at the bedside    Diet Order             Diet NPO time specified Except for: Ice Chips, Sips with Meds  Diet effective now                             Medications:    sodium chloride   Intravenous Once   alteplase  2 mg Intracatheter Once   Chlorhexidine Gluconate Cloth  6 each Topical Daily   heparin injection (subcutaneous)  5,000 Units Subcutaneous Q8H   insulin aspart  0-20 Units Subcutaneous Q4H   insulin aspart  4 Units Subcutaneous Q4H   insulin glargine-yfgn  22 Units Subcutaneous Daily   levothyroxine  137 mcg Per Tube Q0600   lidocaine  2 patch Transdermal Q24H   loperamide  4 mg Oral BID   octreotide  100 mcg Subcutaneous Q8H   pantoprazole (PROTONIX) IV  40 mg Intravenous Q12H   pregabalin  200 mg Per Tube TID  sodium chloride flush  10-40 mL Intracatheter Q12H   Continuous Infusions:  ampicillin-sulbactam (UNASYN) IV Stopped (05/11/23 0548)   dextrose 75 mL/hr at 05/11/23 1059   methocarbamol (ROBAXIN) IV     TPN ADULT (ION) 65 mL/hr at 05/11/23 1059   TPN ADULT (ION)       Anti-infectives (From admission, onward)    Start     Dose/Rate Route Frequency Ordered Stop    05/08/23 0600  Ampicillin-Sulbactam (UNASYN) 3 g in sodium chloride 0.9 % 100 mL IVPB        3 g 200 mL/hr over 30 Minutes Intravenous Every 6 hours 05/07/23 0949 05/14/23 2359   05/05/23 0807  vancomycin variable dose per unstable renal function (pharmacist dosing)  Status:  Discontinued         Does not apply See admin instructions 05/05/23 0807 05/05/23 0823   05/04/23 1800  vancomycin (VANCOCIN) IVPB 1000 mg/200 mL premix  Status:  Discontinued        1,000 mg 200 mL/hr over 60 Minutes Intravenous Every 24 hours 05/04/23 0755 05/05/23 0807   05/04/23 0600  piperacillin-tazobactam (ZOSYN) IVPB 3.375 g        3.375 g 12.5 mL/hr over 240 Minutes Intravenous Every 8 hours 05/04/23 0236 05/08/23 0158   05/03/23 1800  vancomycin (VANCOCIN) IVPB 1000 mg/200 mL premix  Status:  Discontinued        1,000 mg 200 mL/hr over 60 Minutes Intravenous Every 24 hours 05/02/23 1530 05/03/23 0823   05/03/23 1800  vancomycin (VANCOREADY) IVPB 1250 mg/250 mL  Status:  Discontinued        1,250 mg 166.7 mL/hr over 90 Minutes Intravenous Every 24 hours 05/03/23 0823 05/03/23 1752   05/03/23 1800  Vancomycin (VANCOCIN) 1,250 mg in sodium chloride 0.9 % 250 mL IVPB  Status:  Discontinued        1,250 mg 166.7 mL/hr over 90 Minutes Intravenous Every 24 hours 05/03/23 1752 05/04/23 0755   05/02/23 2000  ceFEPIme (MAXIPIME) 2 g in sodium chloride 0.9 % 100 mL IVPB  Status:  Discontinued        2 g 200 mL/hr over 30 Minutes Intravenous Every 8 hours 05/02/23 1530 05/04/23 0236   05/02/23 1630  vancomycin (VANCOREADY) IVPB 2000 mg/400 mL        2,000 mg 200 mL/hr over 120 Minutes Intravenous  Once 05/02/23 1530 05/02/23 1746   05/02/23 1600  metroNIDAZOLE (FLAGYL) IVPB 500 mg  Status:  Discontinued        500 mg 100 mL/hr over 60 Minutes Intravenous Every 12 hours 05/02/23 1453 05/04/23 0236   05/01/23 1400  azithromycin (ZITHROMAX) 500 mg in sodium chloride 0.9 % 250 mL IVPB  Status:  Discontinued        500  mg 250 mL/hr over 60 Minutes Intravenous Every 24 hours 05/01/23 1255 05/02/23 1453   05/01/23 1230  azithromycin (ZITHROMAX) tablet 500 mg  Status:  Discontinued        500 mg Oral Daily 05/01/23 1135 05/01/23 1255   05/01/23 1200  cefTRIAXone (ROCEPHIN) 2 g in sodium chloride 0.9 % 100 mL IVPB  Status:  Discontinued        2 g 200 mL/hr over 30 Minutes Intravenous Every 24 hours 05/01/23 1135 05/02/23 1453   04/28/23 2200  cefoTEtan (CEFOTAN) 2 g in sodium chloride 0.9 % 100 mL IVPB        2 g 200 mL/hr over 30 Minutes Intravenous Every 8 hours 04/28/23 2058  04/29/23 1717   04/27/23 0945  cefoTEtan (CEFOTAN) 2 g in sodium chloride 0.9 % 100 mL IVPB        2 g 200 mL/hr over 30 Minutes Intravenous On call to O.R. 04/27/23 0856 04/28/23 0559   04/05/23 0900  cefTRIAXone (ROCEPHIN) 2 g in sodium chloride 0.9 % 100 mL IVPB        2 g 200 mL/hr over 30 Minutes Intravenous Every 24 hours 04/05/23 0634 04/14/23 1254   12/19/22 1000  ceFAZolin (ANCEF) IVPB 2g/100 mL premix        2 g 200 mL/hr over 30 Minutes Intravenous  Once 12/18/22 1738 12/19/22 1141   09/17/22 1200  vancomycin (VANCOREADY) IVPB 1250 mg/250 mL  Status:  Discontinued        1,250 mg 166.7 mL/hr over 90 Minutes Intravenous Every 24 hours 09/16/22 1040 09/22/22 1139   09/16/22 1200  vancomycin (VANCOREADY) IVPB 1250 mg/250 mL        1,250 mg 166.7 mL/hr over 90 Minutes Intravenous  Once 09/15/22 1706 09/16/22 1255   09/15/22 1200  ceFEPIme (MAXIPIME) 2 g in sodium chloride 0.9 % 100 mL IVPB  Status:  Discontinued        2 g 200 mL/hr over 30 Minutes Intravenous Every 8 hours 09/15/22 1024 09/18/22 1026   09/15/22 1200  vancomycin (VANCOREADY) IVPB 2000 mg/400 mL        2,000 mg 200 mL/hr over 120 Minutes Intravenous  Once 09/15/22 1024 09/15/22 1500   03/04/22 2200  piperacillin-tazobactam (ZOSYN) IVPB 3.375 g        3.375 g 12.5 mL/hr over 240 Minutes Intravenous Every 8 hours 03/04/22 2005 03/18/22 0248   02/21/22 1500   vancomycin (VANCOREADY) IVPB 1250 mg/250 mL  Status:  Discontinued        1,250 mg 166.7 mL/hr over 90 Minutes Intravenous Every 24 hours 02/21/22 1111 02/24/22 1336   02/21/22 1400  meropenem (MERREM) 1 g in sodium chloride 0.9 % 100 mL IVPB        1 g 200 mL/hr over 30 Minutes Intravenous Every 8 hours 02/21/22 1258 02/27/22 2157   02/20/22 1500  vancomycin (VANCOCIN) IVPB 1000 mg/200 mL premix  Status:  Discontinued        1,000 mg 200 mL/hr over 60 Minutes Intravenous Every 24 hours 02/19/22 1706 02/21/22 1111   02/19/22 1115  vancomycin (VANCOREADY) IVPB 2000 mg/400 mL        2,000 mg 200 mL/hr over 120 Minutes Intravenous  Once 02/19/22 1025 02/19/22 1732   02/19/22 1115  fluconazole (DIFLUCAN) IVPB 400 mg        400 mg 100 mL/hr over 120 Minutes Intravenous Every 24 hours 02/19/22 1025 03/04/22 1301   02/15/22 0730  piperacillin-tazobactam (ZOSYN) IVPB 3.375 g  Status:  Discontinued        3.375 g 12.5 mL/hr over 240 Minutes Intravenous Every 8 hours 02/15/22 0639 02/21/22 1235   02/15/22 0655  piperacillin-tazobactam (ZOSYN) 3.375 GM/50ML IVPB       Note to Pharmacy: Debra [name]: cabinet override      02/15/22 0655 02/15/22 0816   02/13/22 2200  cefoTEtan (CEFOTAN) 2 g in sodium chloride 0.9 % 100 mL IVPB  Status:  Discontinued        2 g 200 mL/hr over 30 Minutes Intravenous Every 12 hours 02/13/22 1541 02/15/22 0639   02/13/22 1345  cefoTEtan (CEFOTAN) 2 g in sodium chloride 0.9 % 100 mL IVPB  2 g 200 mL/hr over 30 Minutes Intravenous  Once 02/13/22 1331 02/13/22 2146   02/13/22 1336  sodium chloride 0.9 % with cefoTEtan (CEFOTAN) ADS Med       Note to Pharmacy: Debra [name]: cabinet override      02/13/22 1336 02/14/22 0144   02/13/22 0620  sodium chloride 0.9 % with cefoTEtan (CEFOTAN) ADS Med       Note to Pharmacy: Debra [name]: cabinet override      02/13/22 0620 02/13/22 0802   02/13/22 0600  cefoTEtan (CEFOTAN) 2 g in sodium chloride 0.9 % 100  mL IVPB        2 g 200 mL/hr over 30 Minutes Intravenous On call to O.R. 02/12/22 2229 02/13/22 2146              Family Communication/Anticipated D/C date and plan/Code Status   DVT prophylaxis: heparin injection 5,000 Units Start: 05/04/23 1645 SCDs Start: 02/13/22 1542     Code Status: Full Code        Subjective:   Interval events noted.  No fever overnight.  She has no complaints.  Debra [name], daughter, was at the bedside.  Objective:    Vitals:   05/11/23 0905 05/11/23 1000 05/11/23 1030 05/11/23 1100  BP: (!) 96/50 (!) 87/57 (!) 92/49 (!) 107/49  Pulse: 88 87  88  Resp: (!) 21 (!) 21 19 18   Temp:      TempSrc:      SpO2: 95% 96%  96%  Weight:      Height:       No data found.   Intake/Output Summary (Last 24 hours) at 05/11/2023 1131 Last data filed at 05/11/2023 1059 Gross per 24 hour  Intake 2939.84 ml  Output 3065 ml  Net -125.16 ml   Filed Weights   05/09/23 0623 05/10/23 0428 05/11/23 0336  Weight: 108.9 kg 109.1 kg 108.8 kg    Exam:  GEN: NAD SKIN: Warm and dry EYES: EOMI ENT: MMM CV: RRR PULM: CTA B ABD: soft, obese, NT, +BS, +large open surgical abdominal wound with drain CNS: AAO x 2 (person and place), non focal EXT: Swelling of the upper and lower extremities     Data Reviewed:   I have personally reviewed following labs and imaging studies:  Labs: Labs show the following:   Basic Metabolic Panel: Recent Labs  Lab 05/06/23 0903 05/07/23 0414 05/09/23 0840 05/10/23 0359 05/10/23 1446 05/11/23 0639  NA 150* 147* 155* 157* 143 150*  K 3.8 4.0 4.3 4.3  --  4.6  CL 118* 112* 120* 122*  --  117*  CO2 27 26 27 26   --  26  GLUCOSE 180* 135* 204* 194*  --  145*  BUN 59* 62* 54* 41*  --  46*  CREATININE 1.28* 1.31* 0.82 0.62  --  0.90  CALCIUM 7.8* 7.6* 8.1* 8.1*  --  7.8*  MG  --   --  2.8*  --   --  2.2  PHOS  --   --  2.3*  --   --  3.6   GFR Estimated Creatinine Clearance: 73.7 mL/min (by C-G formula based on  SCr of 0.9 mg/dL). Liver Function Tests: Recent Labs  Lab 05/09/23 0840 05/11/23 0639  AST  --  91*  ALT  --  98*  ALKPHOS  --  153*  BILITOT  --  4.2*  PROT  --  5.1*  ALBUMIN 1.8* 1.7*   No results for input(s): "LIPASE", "  AMYLASE" in the last 168 hours. No results for input(s): "AMMONIA" in the last 168 hours. Coagulation profile Recent Labs  Lab 05/11/23 0639  INR 1.3*    CBC: Recent Labs  Lab 05/05/23 0730 05/05/23 1957 05/06/23 0903 05/07/23 0414 05/09/23 0754 05/11/23 0639  WBC 5.9  --  5.8 6.5 4.7 3.8*  HGB 7.5* 9.7* 10.1* 9.0* 9.5* 9.1*  HCT 24.9* 30.5* 30.9* 28.8* 30.6* 29.0*  MCV 98.0  --  90.6 94.1 96.2 96.7  PLT 45*  --  46* 47* 56* 46*   Cardiac Enzymes: No results for input(s): "CKTOTAL", "CKMB", "CKMBINDEX", "TROPONINI" in the last 168 hours. BNP (last 3 results) No results for input(s): "PROBNP" in the last 8760 hours. CBG: Recent Labs  Lab 05/10/23 1624 05/10/23 1938 05/10/23 2334 05/11/23 0738 05/11/23 1117  GLUCAP 195* 135* 70 132* 135*   D-Dimer: No results for input(s): "DDIMER" in the last 72 hours.  Hgb A1c: No results for input(s): "HGBA1C" in the last 72 hours. Lipid Profile: No results for input(s): "CHOL", "HDL", "LDLCALC", "TRIG", "CHOLHDL", "LDLDIRECT" in the last 72 hours.  Thyroid function studies: No results for input(s): "TSH", "T4TOTAL", "T3FREE", "THYROIDAB" in the last 72 hours.  Invalid input(s): "FREET3" Anemia work up: No results for input(s): "VITAMINB12", "FOLATE", "FERRITIN", "TIBC", "IRON", "RETICCTPCT" in the last 72 hours. Sepsis Labs: Recent Labs  Lab 05/06/23 0903 05/07/23 0414 05/09/23 0754 05/11/23 0639  WBC 5.8 6.5 4.7 3.8*    Microbiology Recent Results (from the past 240 hour(s))  Culture, blood (Routine X 2) w Reflex to ID Panel     Status: None   Collection Time: 05/04/23  3:59 AM   Specimen: BLOOD LEFT ARM  Result Value Ref Range Status   Specimen Description BLOOD LEFT ARM  Final    Special Requests   Final    BOTTLES DRAWN AEROBIC AND ANAEROBIC Blood Culture results may not be optimal due to an inadequate volume of blood received in culture bottles   Culture   Final    NO GROWTH 5 DAYS Performed at [name], [address] [place], [place] [address]    Report Status 05/09/2023 FINAL  Final  Culture, blood (Routine X 2) w Reflex to ID Panel     Status: None   Collection Time: 05/04/23  3:59 AM   Specimen: BLOOD LEFT HAND  Result Value Ref Range Status   Specimen Description BLOOD LEFT HAND  Final   Special Requests   Final    BOTTLES DRAWN AEROBIC ONLY Blood Culture results may not be optimal due to an inadequate volume of blood received in culture bottles   Culture   Final    NO GROWTH 5 DAYS Performed at [facility], [address] [place], [place] [address]    Report Status 05/09/2023 FINAL  Final    Procedures and diagnostic studies:  No results found.             LOS: 452 days   Colbi Staubs  [employer]ered loss adjuster on [email or web address]. If 7PM-7AM, please contact night-coverage at [email or web address]     05/11/2023, 11:31 AM

## 2023-05-11 NOTE — Progress Notes (Signed)
05/11/2023  Subjective: Patient is 13 Days Post-Op.  Patient had some more blood tinged output from NG tube and IV toradol was discontinued.  No further bleeding.  Reports her pain is better today.  Hgb stable, Cr bit increased but within normal range.  Her Na improved yesterday with addition of D5 fluids to 143, but now up again to 150.    Vital signs: Temp:  [97.7 F (36.5 C)-100.8 F (38.2 C)] 98.6 F (37 C) (09/02 0759) Pulse Rate:  [75-97] 88 (09/02 0905) Resp:  [12-22] 21 (09/02 0905) BP: (86-118)/(26-62) 96/50 (09/02 0905) SpO2:  [91 %-98 %] 95 % (09/02 0905) Weight:  [108.8 kg] 108.8 kg (09/02 0336)   Intake/Output: 09/01 0701 - 09/02 0700 In: 3364.2 [I.V.:2804.2; NG/GT:60; IV Piggyback:500] Out: 2635 [Urine:900; Emesis/NG output:300; Drains:135] Last BM Date : 05/11/23  Physical Exam: Constitutional:  No acute distress Abdomen: soft, non-distended, appropriately tender. Her midline wound is open, with mesh exposed, with [name] pouch in place with red rubber catheter for suction. Right drain within the subcutaneous space with high output of light brown fluid. Left drain within the pelvis with low output of same quality.   Labs:  Recent Labs    05/09/23 0754 05/11/23 0639  WBC 4.7 3.8*  HGB 9.5* 9.1*  HCT 30.6* 29.0*  PLT 56* 46*   Recent Labs    05/10/23 0359 05/10/23 1446 05/11/23 0639  NA 157* 143 150*  K 4.3  --  4.6  CL 122*  --  117*  CO2 26  --  26  GLUCOSE 194*  --  145*  BUN 41*  --  46*  CREATININE 0.62  --  0.90  CALCIUM 8.1*  --  7.8*   Recent Labs    05/11/23 0639  LABPROT 16.6*  INR 1.3*    Imaging: No results found.  Assessment/Plan: This is a 57 y.o. female s/p takedown of enterocutaneous fistulas with small bowel resection and anastomosis and abdominal wall reconstruction   --The patient continues with high output.  Imodium added yesterday and will monitor today prior to increasing the dose, likely tomorrow. --Her GI loses are  contributing to dehydration and hypernatremia, and likely combined with the TPN causing mildly elevated LFTs.  Working with teams to help with this. --Continue IV antibiotics --Continue stepdown status due to the high amount of care that she is requiring.   Howie Ill, MD [facility]

## 2023-05-11 NOTE — Progress Notes (Signed)
Dr. Claudine Mouton aware NG tube has started to have bloody/brown drainage again. Per md hold 1400 dose of sq heparin.

## 2023-05-11 NOTE — Progress Notes (Signed)
Made Dr. Claudine Mouton aware axillary temp of 100.6 currently nothing prn to give

## 2023-05-11 NOTE — Plan of Care (Signed)
  Problem: Clinical Measurements: Goal: Respiratory complications will improve Outcome: Progressing   Problem: Cardiac: Goal: Ability to maintain an adequate cardiac output will improve Outcome: Progressing   Problem: Nutrition: Goal: Adequate nutrition will be maintained Outcome: Not Progressing   Problem: Activity: Goal: Risk for activity intolerance will decrease Outcome: Not Progressing   Problem: Nutrition: Goal: Adequate nutrition will be maintained Outcome: Not Progressing   Problem: Pain Managment: Goal: General experience of comfort will improve Outcome: Not Progressing   Problem: Activity: Goal: Ability to tolerate increased activity will improve Outcome: Not Progressing

## 2023-05-11 NOTE — Progress Notes (Signed)
PHARMACY - TOTAL PARENTERAL NUTRITION CONSULT NOTE   Indication: Prolonged ileus  Patient Measurements: Height: 4\' 11"  (149.9 cm) Weight: 108.8 kg (239 lb 13.8 oz) IBW/kg (Calculated) : 43.2 TPN AdjBW (KG): 59 Body mass index is 48.45 kg/m.  Assessment: Debra [name] is a 57 y.o. female s/p laparotomy, excision of greater omental mass, abdominal wall reconstruction with [name], appendectomy, and placement of Prevena vac.  Glucose / Insulin:  BG last 24h: 70 - 195 SSI last 24h: 15u rSSI q4h + Novolog 4u q4h + Semglee 22u daily + insulin removed from TPN 8/26 Electrolytes: Hypernatremia, hyperchloremia, hypermagnesemia, hypophosphatemia Renal: SCr <1, fairly stable Hepatic: AST/ALT trending up I&O:  GI Imaging: 9/11 CTAP: no new acute issues GI Surgeries / Procedures:  02/15/22: S/p laparotomy, excision of greater omental mass, abdominal wall reconstruction with [name], appendectomy, and placement of Prevena vac 12/19/22: Placement of right internal jugular port-a-catheter with dual reservoirs also s/p re-opening of laparotomy for repair of small bowel perforation  04/28/23:  Exploratory laparotomy, extensive lysis of adhesion, 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, partial gastrectomy, abdominal wall reconstruction with mesh placement, and wound vac placement   Central access: PICC 04/09/2023   TPN start date: 04/09/23 TPN from 02/15/2022 to 04/05/2023 - held for bacteremia and TPN restarted 04/09/23 Previously Cyclic TPN, transitioned back to regular TPN on 04/29/23 post surgery  RD Assessment:  Estimated Needs Total Energy Estimated Needs: 1900-2200kcal/day Total Protein Estimated Needs: 95-110g/day Total Fluid Estimated Needs: 1.4-1.6L/day  Current Nutrition: NPO  Plan:  --Continue TPN at new goal rate of 65 mL/hr at 1800 --Electrolytes in TPN: Na 5 mEq/L(decreased on 9/1,2), K 67mEq/L, Ca 22mEq/L, Mg 101mEq/L(removed on  8/31), and Phos 61mmol/L(increased on 8/31) --Acetate maximized as chloride levels remain elevated --Continue multivitamin, trace elements, zinc, selenium and chromium chloride to TPN MD will manage glucose via [place] insulin regimen --Monitor TPN labs daily until stable then on Mon/Thurs  [name] 05/11/2023 7:04 AM

## 2023-05-12 ENCOUNTER — Inpatient Hospital Stay: Payer: Medicaid Other

## 2023-05-12 LAB — GLUCOSE, CAPILLARY
Glucose-Capillary: 106 mg/dL — ABNORMAL HIGH (ref 70–99)
Glucose-Capillary: 112 mg/dL — ABNORMAL HIGH (ref 70–99)
Glucose-Capillary: 152 mg/dL — ABNORMAL HIGH (ref 70–99)
Glucose-Capillary: 153 mg/dL — ABNORMAL HIGH (ref 70–99)
Glucose-Capillary: 159 mg/dL — ABNORMAL HIGH (ref 70–99)
Glucose-Capillary: 162 mg/dL — ABNORMAL HIGH (ref 70–99)
Glucose-Capillary: 183 mg/dL — ABNORMAL HIGH (ref 70–99)
Glucose-Capillary: 189 mg/dL — ABNORMAL HIGH (ref 70–99)
Glucose-Capillary: 209 mg/dL — ABNORMAL HIGH (ref 70–99)
Glucose-Capillary: 221 mg/dL — ABNORMAL HIGH (ref 70–99)
Glucose-Capillary: 222 mg/dL — ABNORMAL HIGH (ref 70–99)
Glucose-Capillary: 236 mg/dL — ABNORMAL HIGH (ref 70–99)

## 2023-05-12 LAB — BASIC METABOLIC PANEL
Anion gap: 7 (ref 5–15)
BUN: 45 mg/dL — ABNORMAL HIGH (ref 6–20)
CO2: 28 mmol/L (ref 22–32)
Calcium: 7.7 mg/dL — ABNORMAL LOW (ref 8.9–10.3)
Chloride: 113 mmol/L — ABNORMAL HIGH (ref 98–111)
Creatinine, Ser: 0.99 mg/dL (ref 0.44–1.00)
GFR, Estimated: >60 mL/min (ref >60)
Glucose, Bld: 196 mg/dL — ABNORMAL HIGH (ref 70–99)
Potassium: 3.7 mmol/L (ref 3.5–5.1)
Sodium: 148 mmol/L — ABNORMAL HIGH (ref 135–145)

## 2023-05-12 MED ORDER — OCTREOTIDE ACETATE 100 MCG/ML IJ SOLN
200.0000 ug | Freq: Three times a day (TID) | INTRAMUSCULAR | Status: DC
  Administered 2023-05-12 – 2023-05-17 (×14): 200 ug via SUBCUTANEOUS
  Filled 2023-05-12 (×20): qty 2
  Filled 2023-05-12: qty 1

## 2023-05-12 MED ORDER — LOPERAMIDE HCL 1 MG/7.5ML PO SUSP
4.0000 mg | Freq: Two times a day (BID) | ORAL | Status: DC
  Administered 2023-05-12 – 2023-05-13 (×3): 4 mg
  Filled 2023-05-12 (×3): qty 30

## 2023-05-12 MED ORDER — SODIUM CHLORIDE 0.45 % IV SOLN: INTRAVENOUS | Status: DC

## 2023-05-12 MED ORDER — ZINC CHLORIDE 1 MG/ML IV SOLN
INTRAVENOUS | Status: AC
  Filled 2023-05-12: qty 707.2

## 2023-05-12 MED ORDER — DEXTROSE 5 % IV SOLN: INTRAVENOUS | Status: AC

## 2023-05-12 NOTE — Progress Notes (Signed)
CC: EC fistula  Subjective: Still hypernatremic w encephalopathy Still high output from fistula both at eakin pouch and intra-abdominal drain Labs ok Kub tip in the stomach no free air Objective: Vital signs in last 24 hours: Temp:  [98.3 F (36.8 C)-99.7 F (37.6 C)] 99.5 F (37.5 C) (09/03 1900) Pulse Rate:  [83-92] 86 (09/03 1900) Resp:  [12-22] 18 (09/03 1900) BP: (81-109)/(40-61) 100/59 (09/03 1900) SpO2:  [95 %-98 %] 96 % (09/03 1900) Weight:  [106.4 kg] 106.4 kg (09/03 0500) Last BM Date : 05/12/23  Intake/Output from previous day: 09/02 0701 - 09/03 0700 In: 3838.6 [I.V.:3299.4; IV Piggyback:539.2] Out: 3140 [Urine:1975; Emesis/NG output:215; Drains:250] Intake/Output this shift: No intake/output data recorded.  Physical exam: NAD resting Abd: soft, eakin pouch w brown staining, some enteric contents within pouch and right JP.  Lab Results: CBC  Recent Labs    05/11/23 0639  WBC 3.8*  HGB 9.1*  HCT 29.0*  PLT 46*   BMET Recent Labs    05/11/23 0639 05/12/23 0643  NA 150* 148*  K 4.6 3.7  CL 117* 113*  CO2 26 28  GLUCOSE 145* 196*  BUN 46* 45*  CREATININE 0.90 0.99  CALCIUM 7.8* 7.7*   PT/INR Recent Labs    05/11/23 0639  LABPROT 16.6*  INR 1.3*   ABG No results for input(s): "PHART", "HCO3" in the last 72 hours.  Invalid input(s): "PCO2", "PO2"  Studies/Results: DG Abd Portable 1V  Result Date: 05/12/2023 CLINICAL DATA:  [id number] Paralytic ileus of small intestine and colon (HCC) [id number] EXAM: PORTABLE ABDOMEN - 1 VIEW COMPARISON:  Abdominopelvic CT 05/06/2023. Radiographs 02/24/2022 and 04/28/2023. FINDINGS: 0924 hours. Two supine views of the abdomen demonstrate the tip of the enteric tube in the left mid abdomen, likely within the mid stomach. Pelvic surgical drains are in place. There has been no significant change in a moderately dilated loop of small bowel in the left mid abdomen compared with recent CT. No suspicious abdominal  calcifications or significant osseous findings. IMPRESSION: No significant change in moderately dilated small bowel loop in the left mid abdomen. Enteric tube tip is in the mid stomach. Electronically Signed   By: Carey Bullocks M.D.   On: 05/12/2023 13:37    Anti-infectives: Anti-infectives (From admission, onward)    Start     Dose/Rate Route Frequency Ordered Stop   05/08/23 0600  Ampicillin-Sulbactam (UNASYN) 3 g in sodium chloride 0.9 % 100 mL IVPB        3 g 200 mL/hr over 30 Minutes Intravenous Every 6 hours 05/07/23 0949 05/14/23 2359   05/05/23 0807  vancomycin variable dose per unstable renal function (pharmacist dosing)  Status:  Discontinued         Does not apply See admin instructions 05/05/23 0807 05/05/23 0823   05/04/23 1800  vancomycin (VANCOCIN) IVPB 1000 mg/200 mL premix  Status:  Discontinued        1,000 mg 200 mL/hr over 60 Minutes Intravenous Every 24 hours 05/04/23 0755 05/05/23 0807   05/04/23 0600  piperacillin-tazobactam (ZOSYN) IVPB 3.375 g        3.375 g 12.5 mL/hr over 240 Minutes Intravenous Every 8 hours 05/04/23 0236 05/08/23 0158   05/03/23 1800  vancomycin (VANCOCIN) IVPB 1000 mg/200 mL premix  Status:  Discontinued        1,000 mg 200 mL/hr over 60 Minutes Intravenous Every 24 hours 05/02/23 1530 05/03/23 0823   05/03/23 1800  vancomycin (VANCOREADY) IVPB 1250 mg/250 mL  Status:  Discontinued        1,250 mg 166.7 mL/hr over 90 Minutes Intravenous Every 24 hours 05/03/23 0823 05/03/23 1752   05/03/23 1800  Vancomycin (VANCOCIN) 1,250 mg in sodium chloride 0.9 % 250 mL IVPB  Status:  Discontinued        1,250 mg 166.7 mL/hr over 90 Minutes Intravenous Every 24 hours 05/03/23 1752 05/04/23 0755   05/02/23 2000  ceFEPIme (MAXIPIME) 2 g in sodium chloride 0.9 % 100 mL IVPB  Status:  Discontinued        2 g 200 mL/hr over 30 Minutes Intravenous Every 8 hours 05/02/23 1530 05/04/23 0236   05/02/23 1630  vancomycin (VANCOREADY) IVPB 2000 mg/400 mL         2,000 mg 200 mL/hr over 120 Minutes Intravenous  Once 05/02/23 1530 05/02/23 1746   05/02/23 1600  metroNIDAZOLE (FLAGYL) IVPB 500 mg  Status:  Discontinued        500 mg 100 mL/hr over 60 Minutes Intravenous Every 12 hours 05/02/23 1453 05/04/23 0236   05/01/23 1400  azithromycin (ZITHROMAX) 500 mg in sodium chloride 0.9 % 250 mL IVPB  Status:  Discontinued        500 mg 250 mL/hr over 60 Minutes Intravenous Every 24 hours 05/01/23 1255 05/02/23 1453   05/01/23 1230  azithromycin (ZITHROMAX) tablet 500 mg  Status:  Discontinued        500 mg Oral Daily 05/01/23 1135 05/01/23 1255   05/01/23 1200  cefTRIAXone (ROCEPHIN) 2 g in sodium chloride 0.9 % 100 mL IVPB  Status:  Discontinued        2 g 200 mL/hr over 30 Minutes Intravenous Every 24 hours 05/01/23 1135 05/02/23 1453   04/28/23 2200  cefoTEtan (CEFOTAN) 2 g in sodium chloride 0.9 % 100 mL IVPB        2 g 200 mL/hr over 30 Minutes Intravenous Every 8 hours 04/28/23 2058 04/29/23 1717   04/27/23 0945  cefoTEtan (CEFOTAN) 2 g in sodium chloride 0.9 % 100 mL IVPB        2 g 200 mL/hr over 30 Minutes Intravenous On call to O.R. 04/27/23 0856 04/28/23 0559   04/05/23 0900  cefTRIAXone (ROCEPHIN) 2 g in sodium chloride 0.9 % 100 mL IVPB        2 g 200 mL/hr over 30 Minutes Intravenous Every 24 hours 04/05/23 0634 04/14/23 1254   12/19/22 1000  ceFAZolin (ANCEF) IVPB 2g/100 mL premix        2 g 200 mL/hr over 30 Minutes Intravenous  Once 12/18/22 1738 12/19/22 1141   09/17/22 1200  vancomycin (VANCOREADY) IVPB 1250 mg/250 mL  Status:  Discontinued        1,250 mg 166.7 mL/hr over 90 Minutes Intravenous Every 24 hours 09/16/22 1040 09/22/22 1139   09/16/22 1200  vancomycin (VANCOREADY) IVPB 1250 mg/250 mL        1,250 mg 166.7 mL/hr over 90 Minutes Intravenous  Once 09/15/22 1706 09/16/22 1255   09/15/22 1200  ceFEPIme (MAXIPIME) 2 g in sodium chloride 0.9 % 100 mL IVPB  Status:  Discontinued        2 g 200 mL/hr over 30 Minutes  Intravenous Every 8 hours 09/15/22 1024 09/18/22 1026   09/15/22 1200  vancomycin (VANCOREADY) IVPB 2000 mg/400 mL        2,000 mg 200 mL/hr over 120 Minutes Intravenous  Once 09/15/22 1024 09/15/22 1500   03/04/22 2200  piperacillin-tazobactam (ZOSYN) IVPB 3.375 g  3.375 g 12.5 mL/hr over 240 Minutes Intravenous Every 8 hours 03/04/22 2005 03/18/22 0248   02/21/22 1500  vancomycin (VANCOREADY) IVPB 1250 mg/250 mL  Status:  Discontinued        1,250 mg 166.7 mL/hr over 90 Minutes Intravenous Every 24 hours 02/21/22 1111 02/24/22 1336   02/21/22 1400  meropenem (MERREM) 1 g in sodium chloride 0.9 % 100 mL IVPB        1 g 200 mL/hr over 30 Minutes Intravenous Every 8 hours 02/21/22 1258 02/27/22 2157   02/20/22 1500  vancomycin (VANCOCIN) IVPB 1000 mg/200 mL premix  Status:  Discontinued        1,000 mg 200 mL/hr over 60 Minutes Intravenous Every 24 hours 02/19/22 1706 02/21/22 1111   02/19/22 1115  vancomycin (VANCOREADY) IVPB 2000 mg/400 mL        2,000 mg 200 mL/hr over 120 Minutes Intravenous  Once 02/19/22 1025 02/19/22 1732   02/19/22 1115  fluconazole (DIFLUCAN) IVPB 400 mg        400 mg 100 mL/hr over 120 Minutes Intravenous Every 24 hours 02/19/22 1025 03/04/22 1301   02/15/22 0730  piperacillin-tazobactam (ZOSYN) IVPB 3.375 g  Status:  Discontinued        3.375 g 12.5 mL/hr over 240 Minutes Intravenous Every 8 hours 02/15/22 0639 02/21/22 1235   02/15/22 0655  piperacillin-tazobactam (ZOSYN) 3.375 GM/50ML IVPB       Note to Pharmacy: Loretha Stapler N: cabinet override      02/15/22 0655 02/15/22 0816   02/13/22 2200  cefoTEtan (CEFOTAN) 2 g in sodium chloride 0.9 % 100 mL IVPB  Status:  Discontinued        2 g 200 mL/hr over 30 Minutes Intravenous Every 12 hours 02/13/22 1541 02/15/22 0639   02/13/22 1345  cefoTEtan (CEFOTAN) 2 g in sodium chloride 0.9 % 100 mL IVPB        2 g 200 mL/hr over 30 Minutes Intravenous  Once 02/13/22 1331 02/13/22 2146   02/13/22 1336   sodium chloride 0.9 % with cefoTEtan (CEFOTAN) ADS Med       Note to Pharmacy: Gates Rigg: cabinet override      02/13/22 1336 02/14/22 0144   02/13/22 0620  sodium chloride 0.9 % with cefoTEtan (CEFOTAN) ADS Med       Note to Pharmacy: Agnes Lawrence A: cabinet override      02/13/22 0620 02/13/22 0802   02/13/22 0600  cefoTEtan (CEFOTAN) 2 g in sodium chloride 0.9 % 100 mL IVPB        2 g 200 mL/hr over 30 Minutes Intravenous On call to O.R. 02/12/22 2229 02/13/22 2146      Assessment/Plan:  Recurrent EC fistula  Keep npo, tpnn, drain, will need CT this week to make sure no other collections Hypernatremia w D5 Needs to continue a/bs Encephalopathy likely from persistent hypernatremia  No need for surgical intervention at this time  Sterling Big, MD, FACS  05/12/2023

## 2023-05-12 NOTE — Progress Notes (Signed)
PHARMACY - TOTAL PARENTERAL NUTRITION CONSULT NOTE   Indication: Prolonged ileus  Patient Measurements: Height: 4\' 11"  (149.9 cm) Weight: 106.4 kg (234 lb 9.1 oz) IBW/kg (Calculated) : 43.2 TPN AdjBW (KG): 59 Body mass index is 47.38 kg/m.  Assessment: Debra [name] is a 57 y.o. female s/p laparotomy, excision of greater omental mass, abdominal wall reconstruction with [name], appendectomy, and placement of Prevena vac.  Glucose / Insulin:  BG last 24h: 135 - 236 SSI last 24h: 27u rSSI q4h + Novolog 4u q4h + Semglee 22u daily + insulin removed from TPN 8/26 Electrolytes: Hypernatremia, hyperchloremia, hypermagnesemia, hypophosphatemia Renal: SCr <1, fairly stable Hepatic: AST/ALT trending up I&O:  GI Imaging: 9/11 CTAP: no new acute issues GI Surgeries / Procedures:  02/15/22: S/p laparotomy, excision of greater omental mass, abdominal wall reconstruction with [name], appendectomy, and placement of Prevena vac 12/19/22: Placement of right internal jugular port-a-catheter with dual reservoirs also s/p re-opening of laparotomy for repair of small bowel perforation  04/28/23:  Exploratory laparotomy, extensive lysis of adhesion, 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, partial gastrectomy, abdominal wall reconstruction with mesh placement, and wound vac placement   Central access: PICC 04/09/2023   TPN start date: 04/09/23 TPN from 02/15/2022 to 04/05/2023 - held for bacteremia and TPN restarted 04/09/23 Previously Cyclic TPN, transitioned back to regular TPN on 04/29/23 post surgery  RD Assessment:  Estimated Needs Total Energy Estimated Needs: 1900-2200kcal/day Total Protein Estimated Needs: 95-110g/day Total Fluid Estimated Needs: 1.4-1.6L/day  Current Nutrition: NPO  Plan:  --Continue TPN at new goal rate of 65 mL/hr at 1800 --Electrolytes in TPN: Na 5 mEq/L(decreased on 9/1&2), K 52mEq/L, Ca 38mEq/L, Mg 62mEq/L(removed on  8/31), and Phos 75mmol/L(increased on 8/31) --Acetate maximized as chloride levels remain elevated --Continue multivitamin, trace elements, zinc, selenium and chromium chloride to TPN MD will manage glucose via [place] insulin regimen --Monitor TPN labs daily until stable then on Mon/Thurs  Debra [name] 05/12/2023 7:42 AM

## 2023-05-12 NOTE — Progress Notes (Signed)
Progress Note    Debra [name]  UJW:[id number] DOB: 1966-07-30  DOA: 02/13/2022 PCP: Hillery Aldo, MD      Brief Narrative:    Medical records reviewed and are as summarized below:  Debra [name] is a 57 y.o. 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 with recurrent disease at anterior pelvis next to the anastomosis which was complicated by high output enterocutaneous fistula , and laparotomy excision of greater omental mass, abdominal wall reconstruction with [name], appendectomy and placement of Prevena vacuum on 06/08, complicated with small bowel perforation, underwent reopening of laparotomy and repair of small bowel perforation, high output enterocutaneous fistula, on chronic TPN since April this year, has had a prolonged hospital stay, complicated with right IJ Port-A-Cath Enterococcus infection which was removed and a new PICC line was placed recently.  Hospitalist team was asked to manage patient's hyperglycemia and coagulopathy.     Significant hospital events:  02/13/22: initial laparotomy, excision of greater omental mass, abdominal wall reconstruction with [name], appendectomy, and placement of Prevena vac  02/15/22: EXPLORATORY LAPAROTOMY REPAIR OF BOWEL PERFORATION, INSERTION OF MESH, APPLICATION OF WOUND VAC 02/26/22:  COLON RESECTION SIGMOID, HERNIA REPAIR VENTRAL, APPENDECTOMY, INSERTION OF MESH, REPAIR ABDOMINAL WALL 07/15/22: Neuro surgery consultation due to L1 compression fracture. Not a candidate for lumbosacral orthotic. 12/19/22: Insertion of Port-a-cath 04/06/23: removal of port-a-cath Throughout stay patient has required TPN, WOC & ID consultation.     Recent hospital events  04/28/23: 12 hours and surgery for abdominal wall reconstruction, recurrent incisional hernia repair, partial gastrectomy, small bowel resection, repair enterotomy x 3 and wound VAC  placement.  Patient extubated postop but too lethargic to maintain airway requiring reintubation and transferred to ICU for postoperative mechanical ventilatory management. 04/29/23: Pt remains mechanically intubated FiO2 40%/PEEP 5.  Pt awake and with purposeful movement on precedex gtt.  Will perform SBT with plans for possible extubation  04/30/23: hospitalist consulted for hyperglycemia and coagulopathy. Overnight fever to 101.8, tachycardic, WBC WNL but concern for SIRS/sepsis 08/23: pro calcitonin (+), starting tx for HCAP. MRSA resp screen recently neg 08/20, will get sputum cx. Echo EF 60-65%, G1DD 08/24: hypernatremic, inflammatory markers up (ferritin high, ALP high), still septic/SIRS, escalating abx, repeat CXR, very cautious fluid administration to help reduce sodium and if increased SOB would d/c fluids and repeat CXR, increased insulin, repeat albumin  08/25: Tachycardia has improved, still tachypnea but no longer meeting sepsis/sirs criteria. Sodium about same 152, Hgb stable. Relatively low UOsm 444 c/w osmotic diuresis (get Glc controlled) / loop diuretics (d/c Lasix) / ADH resistance (correct potassium though hypokalemia is not severe, protein malnutrition is most likely greater factor). Gentle hydration and will need to d/w dietary/pharmacy but malnutrition is going to be a persistent issue. Still substantial fluid loss via NG 08/26: overnight, increased confusion, temp 100.9-101.1, tachycardic, wound vac drainage increased, transferred back to stepdown, switch cefepime + flagyl to Zosyn, continue vanc, follow BCx, neuro-checks, given lasix, CT head no acute process, CT abd/pel no abscess abdominal and multifocal lung infiltrate, CTA chest to r/o PE / eval pneumonia/effusion --> no PE, (+)pulmonary edema. WIll hold fluids and try diuresis tonight, hopefully sodium and BP will hold, signed otu to night team in ICU in case needing pressors 08/27: stable this morning, sodium slightly better,  HH worse, will monitor closely, n oUOP documentd, follow  closely .        Assessment/Plan:  Principal Problem:   Enterocutaneous fistula Active Problems:   S/P hernia repair   Closed wedge compression fracture of first lumbar vertebra (HCC)   Recurrent ventral hernia   Arm swelling   Acute respiratory failure with hypoxia (HCC)   Acute metabolic encephalopathy   HCAP (healthcare-associated pneumonia)   Acute pulmonary edema (HCC)   Nutrition Problem: Increased nutrient needs Etiology: wound healing, catabolic illness  Signs/Symptoms: estimated needs   Body mass index is 47.38 kg/m.  (Morbid obesity)      Anasarca/Edema generalized  Hypoalbuminemia Lasix held because of AKI and hypernatremia S/p treatment with IV albumin 2D echo on 04/30/2023 showed EF estimated 60-65%, grade 1 diastolic dysfunction   HCAP Meeting sepsis criteria overnight 08/22-08/24 w/ tachycardia and fever (+)procalcitonin 7 days of antibiotics is adequate for treatment of pneumonia.    Hypernatremia Slowly improving (down from 150-148).  Continue IV 5% dextrose infusion and monitor sodium levels. Sodium concentration in TPN has been decreased.     Hypokalemia Improved    Acute hypoxic respiratory failure  Continue 4 L/min oxygen and taper off oxygen as able.     AKI  Improved.  Avoid nephrotoxins.   Acute metabolic encephalopathy Continue supportive care   Prediabetes with hyperglycemia Most recent A1c 6.2 indicating a condition of prediabetes at baseline Hyperglycemia likely exacerbated by TPN Continue insulin glargine and NovoLog   Coagulopathy Mild, chronic, review patient's record showed she has a chronic elevation of INR, at baseline 1.2-1.4 and per patient granddaughter, patient has no known history of easy bleeding, no SLE history in the family.  CT abdomen showed no signs of liver cirrhosis or NASH. Getting vitamin K w/ TPN.  INR down to 1.3 on 05/11/2023     Chronic iron deficiency anemia D/t malabsorption. Recent Iron WNL, TIBC low, ferritin high d/t inflammation H&H is stable   Elevated liver enzymes/mild hyperbilirubinemia This may be due to TPN.  Continue to monitor, No acute liver abnormality noted on CT abdomen.  Thrombocytopenia It appears patient has chronic thrombocytopenia.  Monitor platelet count    s/p exploratory laparotomy, extensive lysis of adhesion, 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  She is on IV Unasyn. Follow-up with general surgeon      Diet Order             Diet NPO time specified Except for: Ice Chips, Sips with Meds  Diet effective now                             Medications:    sodium chloride   Intravenous Once   alteplase  2 mg Intracatheter Once   Chlorhexidine Gluconate Cloth  6 each Topical Daily   heparin injection (subcutaneous)  5,000 Units Subcutaneous Q8H   insulin aspart  0-20 Units Subcutaneous Q4H   insulin aspart  4 Units Subcutaneous Q4H   insulin glargine-yfgn  22 Units Subcutaneous Daily   levothyroxine  137 mcg Per Tube Q0600   lidocaine  2 patch Transdermal Q24H   loperamide HCl  4 mg Per Tube BID   octreotide  200 mcg Subcutaneous Q8H   pantoprazole (PROTONIX) IV  40 mg Intravenous Q12H   pregabalin  200 mg Per Tube TID   sodium chloride flush  10-40 mL Intracatheter Q12H   Continuous Infusions:  ampicillin-sulbactam (UNASYN) IV 3 g (05/12/23 0515)   methocarbamol (ROBAXIN)  IV     TPN ADULT (ION) 65 mL/hr at 05/12/23 0600   TPN ADULT (ION)       Anti-infectives (From admission, onward)    Start     Dose/Rate Route Frequency Ordered Stop   05/08/23 0600  Ampicillin-Sulbactam (UNASYN) 3 g in sodium chloride 0.9 % 100 mL IVPB        3 g 200 mL/hr over 30 Minutes Intravenous Every 6 hours 05/07/23 0949 05/14/23 2359    05/05/23 0807  vancomycin variable dose per unstable renal function (pharmacist dosing)  Status:  Discontinued         Does not apply See admin instructions 05/05/23 0807 05/05/23 0823   05/04/23 1800  vancomycin (VANCOCIN) IVPB 1000 mg/200 mL premix  Status:  Discontinued        1,000 mg 200 mL/hr over 60 Minutes Intravenous Every 24 hours 05/04/23 0755 05/05/23 0807   05/04/23 0600  piperacillin-tazobactam (ZOSYN) IVPB 3.375 g        3.375 g 12.5 mL/hr over 240 Minutes Intravenous Every 8 hours 05/04/23 0236 05/08/23 0158   05/03/23 1800  vancomycin (VANCOCIN) IVPB 1000 mg/200 mL premix  Status:  Discontinued        1,000 mg 200 mL/hr over 60 Minutes Intravenous Every 24 hours 05/02/23 1530 05/03/23 0823   05/03/23 1800  vancomycin (VANCOREADY) IVPB 1250 mg/250 mL  Status:  Discontinued        1,250 mg 166.7 mL/hr over 90 Minutes Intravenous Every 24 hours 05/03/23 0823 05/03/23 1752   05/03/23 1800  Vancomycin (VANCOCIN) 1,250 mg in sodium chloride 0.9 % 250 mL IVPB  Status:  Discontinued        1,250 mg 166.7 mL/hr over 90 Minutes Intravenous Every 24 hours 05/03/23 1752 05/04/23 0755   05/02/23 2000  ceFEPIme (MAXIPIME) 2 g in sodium chloride 0.9 % 100 mL IVPB  Status:  Discontinued        2 g 200 mL/hr over 30 Minutes Intravenous Every 8 hours 05/02/23 1530 05/04/23 0236   05/02/23 1630  vancomycin (VANCOREADY) IVPB 2000 mg/400 mL        2,000 mg 200 mL/hr over 120 Minutes Intravenous  Once 05/02/23 1530 05/02/23 1746   05/02/23 1600  metroNIDAZOLE (FLAGYL) IVPB 500 mg  Status:  Discontinued        500 mg 100 mL/hr over 60 Minutes Intravenous Every 12 hours 05/02/23 1453 05/04/23 0236   05/01/23 1400  azithromycin (ZITHROMAX) 500 mg in sodium chloride 0.9 % 250 mL IVPB  Status:  Discontinued        500 mg 250 mL/hr over 60 Minutes Intravenous Every 24 hours 05/01/23 1255 05/02/23 1453   05/01/23 1230  azithromycin (ZITHROMAX) tablet 500 mg  Status:  Discontinued        500 mg Oral  Daily 05/01/23 1135 05/01/23 1255   05/01/23 1200  cefTRIAXone (ROCEPHIN) 2 g in sodium chloride 0.9 % 100 mL IVPB  Status:  Discontinued        2 g 200 mL/hr over 30 Minutes Intravenous Every 24 hours 05/01/23 1135 05/02/23 1453   04/28/23 2200  cefoTEtan (CEFOTAN) 2 g in sodium chloride 0.9 % 100 mL IVPB        2 g 200 mL/hr over 30 Minutes Intravenous Every 8 hours 04/28/23 2058 04/29/23 1717   04/27/23 0945  cefoTEtan (CEFOTAN) 2 g in sodium chloride 0.9 % 100 mL IVPB        2 g 200 mL/hr over  30 Minutes Intravenous On call to O.R. 04/27/23 0856 04/28/23 0559   04/05/23 0900  cefTRIAXone (ROCEPHIN) 2 g in sodium chloride 0.9 % 100 mL IVPB        2 g 200 mL/hr over 30 Minutes Intravenous Every 24 hours 04/05/23 0634 04/14/23 1254   12/19/22 1000  ceFAZolin (ANCEF) IVPB 2g/100 mL premix        2 g 200 mL/hr over 30 Minutes Intravenous  Once 12/18/22 1738 12/19/22 1141   09/17/22 1200  vancomycin (VANCOREADY) IVPB 1250 mg/250 mL  Status:  Discontinued        1,250 mg 166.7 mL/hr over 90 Minutes Intravenous Every 24 hours 09/16/22 1040 09/22/22 1139   09/16/22 1200  vancomycin (VANCOREADY) IVPB 1250 mg/250 mL        1,250 mg 166.7 mL/hr over 90 Minutes Intravenous  Once 09/15/22 1706 09/16/22 1255   09/15/22 1200  ceFEPIme (MAXIPIME) 2 g in sodium chloride 0.9 % 100 mL IVPB  Status:  Discontinued        2 g 200 mL/hr over 30 Minutes Intravenous Every 8 hours 09/15/22 1024 09/18/22 1026   09/15/22 1200  vancomycin (VANCOREADY) IVPB 2000 mg/400 mL        2,000 mg 200 mL/hr over 120 Minutes Intravenous  Once 09/15/22 1024 09/15/22 1500   03/04/22 2200  piperacillin-tazobactam (ZOSYN) IVPB 3.375 g        3.375 g 12.5 mL/hr over 240 Minutes Intravenous Every 8 hours 03/04/22 2005 03/18/22 0248   02/21/22 1500  vancomycin (VANCOREADY) IVPB 1250 mg/250 mL  Status:  Discontinued        1,250 mg 166.7 mL/hr over 90 Minutes Intravenous Every 24 hours 02/21/22 1111 02/24/22 1336   02/21/22  1400  meropenem (MERREM) 1 g in sodium chloride 0.9 % 100 mL IVPB        1 g 200 mL/hr over 30 Minutes Intravenous Every 8 hours 02/21/22 1258 02/27/22 2157   02/20/22 1500  vancomycin (VANCOCIN) IVPB 1000 mg/200 mL premix  Status:  Discontinued        1,000 mg 200 mL/hr over 60 Minutes Intravenous Every 24 hours 02/19/22 1706 02/21/22 1111   02/19/22 1115  vancomycin (VANCOREADY) IVPB 2000 mg/400 mL        2,000 mg 200 mL/hr over 120 Minutes Intravenous  Once 02/19/22 1025 02/19/22 1732   02/19/22 1115  fluconazole (DIFLUCAN) IVPB 400 mg        400 mg 100 mL/hr over 120 Minutes Intravenous Every 24 hours 02/19/22 1025 03/04/22 1301   02/15/22 0730  piperacillin-tazobactam (ZOSYN) IVPB 3.375 g  Status:  Discontinued        3.375 g 12.5 mL/hr over 240 Minutes Intravenous Every 8 hours 02/15/22 0639 02/21/22 1235   02/15/22 0655  piperacillin-tazobactam (ZOSYN) 3.375 GM/50ML IVPB       Note to Pharmacy: Loretha Stapler N: cabinet override      02/15/22 0655 02/15/22 0816   02/13/22 2200  cefoTEtan (CEFOTAN) 2 g in sodium chloride 0.9 % 100 mL IVPB  Status:  Discontinued        2 g 200 mL/hr over 30 Minutes Intravenous Every 12 hours 02/13/22 1541 02/15/22 0639   02/13/22 1345  cefoTEtan (CEFOTAN) 2 g in sodium chloride 0.9 % 100 mL IVPB        2 g 200 mL/hr over 30 Minutes Intravenous  Once 02/13/22 1331 02/13/22 2146   02/13/22 1336  sodium chloride 0.9 % with cefoTEtan (CEFOTAN) ADS  Med       Note to Pharmacy: Gates Rigg: cabinet override      02/13/22 1336 02/14/22 0144   02/13/22 0620  sodium chloride 0.9 % with cefoTEtan (CEFOTAN) ADS Med       Note to Pharmacy: Agnes Lawrence A: cabinet override      02/13/22 0620 02/13/22 0802   02/13/22 0600  cefoTEtan (CEFOTAN) 2 g in sodium chloride 0.9 % 100 mL IVPB        2 g 200 mL/hr over 30 Minutes Intravenous On call to O.R. 02/12/22 2229 02/13/22 2146              Family Communication/Anticipated D/C date and  plan/Code Status   DVT prophylaxis: heparin injection 5,000 Units Start: 05/04/23 1645 SCDs Start: 02/13/22 1542     Code Status: Full Code        Subjective:   Interval events noted.  No family at bedside.  Patient is sleepy and unable to provide any history.  Objective:    Vitals:   05/12/23 0900 05/12/23 1000 05/12/23 1100 05/12/23 1200  BP: (!) 96/58 (!) 96/41 (!) 88/50 (!) 101/50  Pulse: 86 87 84 85  Resp: 12 14 13 15   Temp: 98.3 F (36.8 C)     TempSrc: Axillary     SpO2: 97% 95% 98% 97%  Weight:      Height:       No data found.   Intake/Output Summary (Last 24 hours) at 05/12/2023 1259 Last data filed at 05/12/2023 1000 Gross per 24 hour  Intake 3053.34 ml  Output 2835 ml  Net 218.34 ml   Filed Weights   05/10/23 0428 05/11/23 0336 05/12/23 0500  Weight: 109.1 kg 108.8 kg 106.4 kg    Exam:   GEN: NAD SKIN: Warm and dry EYES: Anicteric ENT: MMM CV: RRR PULM: CTA B ABD: soft, obese, NT, +BS, + appearing abdominal wound with drain CNS: Drowsy but arousable  EXT: Edema of bilateral upper and lower extremities      Data Reviewed:   I have personally reviewed following labs and imaging studies:  Labs: Labs show the following:   Basic Metabolic Panel: Recent Labs  Lab 05/07/23 0414 05/09/23 0840 05/10/23 0359 05/10/23 1446 05/11/23 0639 05/12/23 0643  NA 147* 155* 157* 143 150* 148*  K 4.0 4.3 4.3  --  4.6 3.7  CL 112* 120* 122*  --  117* 113*  CO2 26 27 26   --  26 28  GLUCOSE 135* 204* 194*  --  145* 196*  BUN 62* 54* 41*  --  46* 45*  CREATININE 1.31* 0.82 0.62  --  0.90 0.99  CALCIUM 7.6* 8.1* 8.1*  --  7.8* 7.7*  MG  --  2.8*  --   --  2.2  --   PHOS  --  2.3*  --   --  3.6  --    GFR Estimated Creatinine Clearance: 66.2 mL/min (by C-G formula based on SCr of 0.99 mg/dL). Liver Function Tests: Recent Labs  Lab 05/09/23 0840 05/11/23 0639  AST  --  91*  ALT  --  98*  ALKPHOS  --  153*  BILITOT  --  4.2*  PROT  --   5.1*  ALBUMIN 1.8* 1.7*   No results for input(s): "LIPASE", "AMYLASE" in the last 168 hours. No results for input(s): "AMMONIA" in the last 168 hours. Coagulation profile Recent Labs  Lab 05/11/23 0639  INR 1.3*  CBC: Recent Labs  Lab 05/05/23 1957 05/06/23 0903 05/07/23 0414 05/09/23 0754 05/11/23 0639  WBC  --  5.8 6.5 4.7 3.8*  HGB 9.7* 10.1* 9.0* 9.5* 9.1*  HCT 30.5* 30.9* 28.8* 30.6* 29.0*  MCV  --  90.6 94.1 96.2 96.7  PLT  --  46* 47* 56* 46*   Cardiac Enzymes: No results for input(s): "CKTOTAL", "CKMB", "CKMBINDEX", "TROPONINI" in the last 168 hours. BNP (last 3 results) No results for input(s): "PROBNP" in the last 8760 hours. CBG: Recent Labs  Lab 05/11/23 1923 05/12/23 0019 05/12/23 0333 05/12/23 0720 05/12/23 1123  GLUCAP 159* 236* 221* 162* 112*   D-Dimer: No results for input(s): "DDIMER" in the last 72 hours.  Hgb A1c: No results for input(s): "HGBA1C" in the last 72 hours. Lipid Profile: No results for input(s): "CHOL", "HDL", "LDLCALC", "TRIG", "CHOLHDL", "LDLDIRECT" in the last 72 hours.  Thyroid function studies: No results for input(s): "TSH", "T4TOTAL", "T3FREE", "THYROIDAB" in the last 72 hours.  Invalid input(s): "FREET3" Anemia work up: No results for input(s): "VITAMINB12", "FOLATE", "FERRITIN", "TIBC", "IRON", "RETICCTPCT" in the last 72 hours. Sepsis Labs: Recent Labs  Lab 05/06/23 0903 05/07/23 0414 05/09/23 0754 05/11/23 0639  WBC 5.8 6.5 4.7 3.8*    Microbiology Recent Results (from the past 240 hour(s))  Culture, blood (Routine X 2) w Reflex to ID Panel     Status: None   Collection Time: 05/04/23  3:59 AM   Specimen: BLOOD LEFT ARM  Result Value Ref Range Status   Specimen Description BLOOD LEFT ARM  Final   Special Requests   Final    BOTTLES DRAWN AEROBIC AND ANAEROBIC Blood Culture results may not be optimal due to an inadequate volume of blood received in culture bottles   Culture   Final    NO GROWTH 5  DAYS Performed at [facility], [address] [place], [place] [address]    Report Status 05/09/2023 FINAL  Final  Culture, blood (Routine X 2) w Reflex to ID Panel     Status: None   Collection Time: 05/04/23  3:59 AM   Specimen: BLOOD LEFT HAND  Result Value Ref Range Status   Specimen Description BLOOD LEFT HAND  Final   Special Requests   Final    BOTTLES DRAWN AEROBIC ONLY Blood Culture results may not be optimal due to an inadequate volume of blood received in culture bottles   Culture   Final    NO GROWTH 5 DAYS Performed at [facility], [address], [place], [place] [address]    Report Status 05/09/2023 FINAL  Final    Procedures and diagnostic studies:  No results found.             LOS: 453 days   Perl Kerney  [employer] loss adjuster on [email or web address]. If 7PM-7AM, please contact night-coverage at [email or web address]     05/12/2023, 12:59 PM

## 2023-05-12 NOTE — TOC Progression Note (Signed)
Transition of Care [facility]) - Progression Note    Patient Details  Name: Christy Goicoechea MRN: [id number] Date of Birth: 1965-10-15  Transition of Care [facility]) CM/SW Contact  Kreg Shropshire, RN Phone Number: 05/12/2023, 12:10 PM  Clinical Narrative:     Cm received email back from Risa Grill, Financial Navigator stating the following regarding pt Medicaid status:  She has a pending financial assistance application. Since she is undocumented emergency Medicaid will only approve up to 3 months for the same issue unfortunately. It will take a while because the amount is so high, so it has to go to the higher ups for approval.  Expected Discharge Plan: Home w Home Health Services Barriers to Discharge: Continued Medical Work up  Expected Discharge Plan and Services                                               Social Determinants of Health (SDOH) Interventions SDOH Screenings   Food Insecurity: No Food Insecurity (05/20/2022)  Housing: Low Risk  (05/20/2022)  Transportation Needs: No Transportation Needs (05/20/2022)  Utilities: Not At Risk (05/20/2022)  Tobacco Use: Low Risk  (05/19/2022)    Readmission Risk Interventions     No data to display

## 2023-05-12 NOTE — Progress Notes (Signed)
Nutrition Follow-up  DOCUMENTATION CODES:   Obesity unspecified  INTERVENTION:   Recommend initiation of enteral nutrition via NGT once medically appropriate   Continue TPN per pharmacy (24hr)- provides 2192kcal/day and 109g/day protein   Continue zinc additional 10mg  daily in TPN   Continue selenium additional daily in TPN  Resume ergocalciferol 50,000 units po weekly once appropriate   Resume vitamin A 30,000 units po daily once appropriate   Daily weights   NUTRITION DIAGNOSIS:   Increased nutrient needs related to wound healing, catabolic illness as evidenced by estimated needs. -ongoing   GOAL:   Patient will meet greater than or equal to 90% of their needs -met with TPN  MONITOR:   Diet advancement, Labs, Weight trends, Skin, I & O's, Other (Comment) (TPN)  ASSESSMENT:   57 y/o female with h/o hypothyroidism, COVID 19 (3/21), kidney stones and stage 3 colon cancer (adenocarcinoma s/p left hemicolectomy 01/26/22 and chemotherapy) who was admitted for new pelvic mass s/p laparotomy 02/13/22 (with excision of pelvic mass from greater omentum, abdominal wall reconstruction with bilateral myocutaneous flaps and mesh, incisional hernia repair, appendectomy repair and VAC placement) complicated by bowel perforation s/p reopening of recent laparotomy 02/15/22 (with repair of small bowel perforation, excision of mesh, placement of two phasix mesh and VAC placement) complicated by EC fistula requiring chronic TPN, new L1 compression fracture and grade 1 diastolic dysfunction with preserved LVEF and now s/p laparotomy 04/28/23 (with abdominal wall reconstruction with bilateral myocutaneous flaps, recurrent incisional hernia repair, partial distal  gastrectomy, small bowel resection (~80 cm of distal jejunum including three EC fistulas), extensive LOA, repair of enterotomies x 3, VAC placement, excisional debridement of abdominal wall skin and excision of old mesh) complicated by  HCAP, AKI, volume overload and sepsis.   Pt tolerating TPN well at goal rate. Refeed labs stable. Triglycerides wnl. Pt with hypernatremia; IVF initiated. Vitamin labs being monitored regularly and are repleted as needed. Pt remains NPO. OGT in place with minimal output. Pt is having bowel function. Per chart, pt remains up >25lbs from her UBW. Would recommend placement of small bore NGT and nutrition support when medically appropriate; this has been discussed with MD. Recommend continued nutrition support until post op healing is complete as pt has continued to have poor oral intake in hospital.   Medications reviewed and include: heparin, insulin, synthroid, imodium, octreotide, protonix, unasyn, 5% dextrose @75ml /hr,  TPN  -Selenium- 59.0(L), zinc- 46 wnl, vitamin D 35.99, vitamin A- 12.5(L)- 8/23 -Iodine- 49.8 wnl, chromium- 2.3(H), copper- 93 wnl, manganese- 19.0 wnl- 7/22 -Vitamin B1- 70.3 wnl, B6- 16.4 wnl, vitamin C- 0.4 wnl, vitamin E- 11.8 wnl, vitamin K 1.43 wnl- 7/22 -folate 14.8 wnl, B12- 870 wnl- 7/22 -carnitine 32.0 wnl- 4/12 -aluminum 10(H)- 4/12 --Iron 16(L), TIBC 172, ferritin 86- 8/22  Labs reviewed: Na 148(H), K 3.7 wnl, BUN 45(H) P 3.6 wnl, Mg 2.2 wnl- 9/2 Alk phos 153(H), AST 91(H), ALT 98(H), tbili 4.2(H)- 9/2 Triglycerides- 147- 8/26 Wbc- 3.8(L), Hgb 9.1(L), Hct 29.0(L)- 9/2 Cbgs- 112, 162, 221, 236 hrs  Drains-   UOP-   OGT output-   Diet Order:    Diet Order             Diet NPO time specified Except for: Ice Chips, Sips with Meds  Diet effective now                  EDUCATION NEEDS:   Not appropriate for education at this time  Skin:  Skin Assessment: Reviewed RN Assessment- abdominal surgical wound 15 cm x 11 cm x 2 cm, VAC   Last BM:  9/3- type 7  Height:   Ht Readings from Last 1 Encounters:  04/28/23 4\' 11"  (1.499 m)    Weight:   Wt Readings from Last 1 Encounters:  05/12/23 106.4 kg    Ideal Body Weight:   44.3 kg  BMI:  Body mass index is 47.38 kg/m.  Estimated Nutritional Needs:   Kcal:  1900-2200kcal/day  Protein:  95-110g/day  Fluid:  1.4-1.6L/day  Betsey Holiday MS, RD, LDN Please refer to [facility] for RD and/or RD on-call/weekend/after hours pager

## 2023-05-12 NOTE — Plan of Care (Signed)
  Problem: Clinical Measurements: Goal: Respiratory complications will improve Outcome: Progressing   

## 2023-05-13 ENCOUNTER — Inpatient Hospital Stay: Payer: Medicaid Other

## 2023-05-13 LAB — COMPREHENSIVE METABOLIC PANEL
ALT: 79 U/L — ABNORMAL HIGH (ref 0–44)
AST: 66 U/L — ABNORMAL HIGH (ref 15–41)
Albumin: <1.5 g/dL — ABNORMAL LOW (ref 3.5–5.0)
Alkaline Phosphatase: 124 U/L (ref 38–126)
Anion gap: 8 (ref 5–15)
BUN: 48 mg/dL — ABNORMAL HIGH (ref 6–20)
CO2: 27 mmol/L (ref 22–32)
Calcium: 7.5 mg/dL — ABNORMAL LOW (ref 8.9–10.3)
Chloride: 106 mmol/L (ref 98–111)
Creatinine, Ser: 1.08 mg/dL — ABNORMAL HIGH (ref 0.44–1.00)
GFR, Estimated: 59 mL/min — ABNORMAL LOW (ref >60)
Glucose, Bld: 259 mg/dL — ABNORMAL HIGH (ref 70–99)
Potassium: 3.5 mmol/L (ref 3.5–5.1)
Sodium: 141 mmol/L (ref 135–145)
Total Bilirubin: 4.9 mg/dL — ABNORMAL HIGH (ref 0.3–1.2)
Total Protein: 5 g/dL — ABNORMAL LOW (ref 6.5–8.1)

## 2023-05-13 LAB — CBC
HCT: 24.5 % — ABNORMAL LOW (ref 36.0–46.0)
Hemoglobin: 8 g/dL — ABNORMAL LOW (ref 12.0–15.0)
MCH: 30.5 pg (ref 26.0–34.0)
MCHC: 32.7 g/dL (ref 30.0–36.0)
MCV: 93.5 fL (ref 80.0–100.0)
Platelets: 39 10*3/uL — ABNORMAL LOW (ref 150–400)
RBC: 2.62 MIL/uL — ABNORMAL LOW (ref 3.87–5.11)
RDW: 24.7 % — ABNORMAL HIGH (ref 11.5–15.5)
WBC: 4.1 10*3/uL (ref 4.0–10.5)
nRBC: 1.2 % — ABNORMAL HIGH (ref 0.0–0.2)

## 2023-05-13 LAB — GLUCOSE, CAPILLARY
Glucose-Capillary: 107 mg/dL — ABNORMAL HIGH (ref 70–99)
Glucose-Capillary: 131 mg/dL — ABNORMAL HIGH (ref 70–99)
Glucose-Capillary: 185 mg/dL — ABNORMAL HIGH (ref 70–99)
Glucose-Capillary: 191 mg/dL — ABNORMAL HIGH (ref 70–99)
Glucose-Capillary: 216 mg/dL — ABNORMAL HIGH (ref 70–99)
Glucose-Capillary: 235 mg/dL — ABNORMAL HIGH (ref 70–99)

## 2023-05-13 MED ORDER — ACETAMINOPHEN 10 MG/ML IV SOLN
1000.0000 mg | Freq: Four times a day (QID) | INTRAVENOUS | Status: AC
  Administered 2023-05-13 – 2023-05-14 (×4): 1000 mg via INTRAVENOUS
  Filled 2023-05-13 (×4): qty 100

## 2023-05-13 MED ORDER — NOREPINEPHRINE 4 MG/250ML-% IV SOLN
0.0000 ug/min | INTRAVENOUS | Status: DC
  Administered 2023-05-13: 4 ug/min via INTRAVENOUS
  Administered 2023-05-13: 16 ug/min via INTRAVENOUS
  Administered 2023-05-14 (×2): 25 ug/min via INTRAVENOUS
  Administered 2023-05-14: 30 ug/min via INTRAVENOUS
  Administered 2023-05-14: 20 ug/min via INTRAVENOUS
  Filled 2023-05-13 (×8): qty 250

## 2023-05-13 MED ORDER — SODIUM CHLORIDE 0.9 % IV BOLUS
1000.0000 mL | Freq: Once | INTRAVENOUS | Status: AC
  Administered 2023-05-13: 1000 mL via INTRAVENOUS

## 2023-05-13 MED ORDER — LOPERAMIDE HCL 1 MG/7.5ML PO SUSP
4.0000 mg | Freq: Three times a day (TID) | ORAL | Status: DC
  Administered 2023-05-13 – 2023-05-16 (×5): 4 mg
  Filled 2023-05-13 (×15): qty 30

## 2023-05-13 MED ORDER — IOHEXOL 300 MG/ML  SOLN
100.0000 mL | Freq: Once | INTRAMUSCULAR | Status: AC | PRN
  Administered 2023-05-13: 100 mL via INTRAVENOUS

## 2023-05-13 MED ORDER — ZINC CHLORIDE 1 MG/ML IV SOLN
INTRAVENOUS | Status: AC
  Filled 2023-05-13: qty 707.2

## 2023-05-13 MED ORDER — ALBUMIN HUMAN 25 % IV SOLN
50.0000 g | Freq: Once | INTRAVENOUS | Status: AC
  Administered 2023-05-13: 50 g via INTRAVENOUS
  Filled 2023-05-13: qty 200

## 2023-05-13 MED ORDER — SODIUM CHLORIDE 0.45 % IV SOLN: INTRAVENOUS | Status: DC

## 2023-05-13 MED ORDER — IOHEXOL 9 MG/ML PO SOLN
500.0000 mL | ORAL | Status: AC
  Administered 2023-05-13 (×2): 500 mL via ORAL

## 2023-05-13 NOTE — Progress Notes (Signed)
PROGRESS NOTE    Debra [name]  ZHY:[id number] DOB: 1966-07-19 DOA: 02/13/2022 PCP: Hillery Aldo, MD    Brief Narrative:  Debra [name] is a 57 y.o. 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 with recurrent disease at anterior pelvis next to the anastomosis which was complicated by high output enterocutaneous fistula , and laparotomy excision of greater omental mass, abdominal wall reconstruction with [name], appendectomy and placement of Prevena vacuum on 06/08, complicated with small bowel perforation, underwent reopening of laparotomy and repair of small bowel perforation, high output enterocutaneous fistula, on chronic TPN since April this year, has had a prolonged hospital stay, complicated with right IJ Port-A-Cath Enterococcus infection which was removed and a new PICC line was placed recently.  Hospitalist team was asked to manage patient's hyperglycemia and coagulopathy.     Significant hospital events:  02/13/22: initial laparotomy, excision of greater omental mass, abdominal wall reconstruction with [name], appendectomy, and placement of Prevena vac  02/15/22: EXPLORATORY LAPAROTOMY REPAIR OF BOWEL PERFORATION, INSERTION OF MESH, APPLICATION OF WOUND VAC 02/26/22:  COLON RESECTION SIGMOID, HERNIA REPAIR VENTRAL, APPENDECTOMY, INSERTION OF MESH, REPAIR ABDOMINAL WALL 07/15/22: Neuro surgery consultation due to L1 compression fracture. Not a candidate for lumbosacral orthotic. 12/19/22: Insertion of Port-a-cath 04/06/23: removal of port-a-cath Throughout stay patient has required TPN, WOC & ID consultation.     Recent hospital events  04/28/23: 12 hours and surgery for abdominal wall reconstruction, recurrent incisional hernia repair, partial gastrectomy, small bowel resection, repair enterotomy x 3 and wound VAC placement.  Patient extubated postop but too lethargic to maintain airway  requiring reintubation and transferred to ICU for postoperative mechanical ventilatory management. 04/29/23: Pt remains mechanically intubated FiO2 40%/PEEP 5.  Pt awake and with purposeful movement on precedex gtt.  Will perform SBT with plans for possible extubation  04/30/23: hospitalist consulted for hyperglycemia and coagulopathy. Overnight fever to 101.8, tachycardic, WBC WNL but concern for SIRS/sepsis 08/23: pro calcitonin (+), starting tx for HCAP. MRSA resp screen recently neg 08/20, will get sputum cx. Echo EF 60-65%, G1DD 08/24: hypernatremic, inflammatory markers up (ferritin high, ALP high), still septic/SIRS, escalating abx, repeat CXR, very cautious fluid administration to help reduce sodium and if increased SOB would d/c fluids and repeat CXR, increased insulin, repeat albumin  08/25: Tachycardia has improved, still tachypnea but no longer meeting sepsis/sirs criteria. Sodium about same 152, Hgb stable. Relatively low UOsm 444 c/w osmotic diuresis (get Glc controlled) / loop diuretics (d/c Lasix) / ADH resistance (correct potassium though hypokalemia is not severe, protein malnutrition is most likely greater factor). Gentle hydration and will need to d/w dietary/pharmacy but malnutrition is going to be a persistent issue. Still substantial fluid loss via NG 08/26: overnight, increased confusion, temp 100.9-101.1, tachycardic, wound vac drainage increased, transferred back to stepdown, switch cefepime + flagyl to Zosyn, continue vanc, follow BCx, neuro-checks, given lasix, CT head no acute process, CT abd/pel no abscess abdominal and multifocal lung infiltrate, CTA chest to r/o PE / eval pneumonia/effusion --> no PE, (+)pulmonary edema. WIll hold fluids and try diuresis tonight, hopefully sodium and BP will hold, signed otu to night team in ICU in case needing pressors 08/27: stable this morning, sodium slightly better, HH worse, will monitor closely, n oUOP documentd, follow  closely .  9/4:  Sodium improved, remains on IV Unasyn   Assessment & Plan:   Principal Problem:   Enterocutaneous fistula Active Problems:  S/P hernia repair   Closed wedge compression fracture of first lumbar vertebra (HCC)   Recurrent ventral hernia   Arm swelling   Acute respiratory failure with hypoxia (HCC)   Acute metabolic encephalopathy   HCAP (healthcare-associated pneumonia)   Acute pulmonary edema (HCC)   Anasarca/Edema generalized  Hypoalbuminemia Lasix held because of AKI and hypernatremia Continue to hold Lasix for now while on D5 S/p treatment with IV albumin 2D echo on 04/30/2023 showed EF estimated 60-65%, grade 1 diastolic dysfunction     HCAP Meeting sepsis criteria overnight 08/22-08/24 w/ tachycardia and fever (+)procalcitonin 7 days of antibiotics is adequate for treatment of pneumonia.  Last dose should be 9/5     Hypernatremia Slowly improving (down from 150-148).   Continue IV 5% dextrose infusion and monitor sodium levels. Sodium concentration in TPN has been decreased.       Hypokalemia Improved     Acute hypoxic respiratory failure  Continue 4 L/min oxygen and taper off oxygen as able.      AKI  Improved.  Avoid nephrotoxins.     Acute metabolic encephalopathy Continue supportive care     Prediabetes with hyperglycemia Most recent A1c 6.2 indicating a condition of prediabetes at baseline Hyperglycemia likely exacerbated by TPN Continue insulin glargine and NovoLog     Coagulopathy Mild, chronic, review patient's record showed she has a chronic elevation of INR, at baseline 1.2-1.4 and per patient granddaughter, patient has no known history of easy bleeding, no SLE history in the family.  CT abdomen showed no signs of liver cirrhosis or NASH. Getting vitamin K w/ TPN.  INR down to 1.3 on 05/11/2023     Chronic iron deficiency anemia D/t malabsorption. Recent Iron WNL, TIBC low, ferritin high d/t inflammation H&H is stable     Elevated  liver enzymes/mild hyperbilirubinemia This may be due to TPN.  Continue to monitor, No acute liver abnormality noted on CT abdomen.   Thrombocytopenia It appears patient has chronic thrombocytopenia.  Monitor platelet count     s/p exploratory laparotomy, extensive lysis of adhesion, 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  She is on IV Unasyn. Follow-up with general surgeon   DVT prophylaxis: SQ heparin Code Status: Full Family Communication: None Disposition Plan: Status is: Inpatient Remains inpatient appropriate because: Multiple acute issues as above   Level of care: Stepdown  Consultants:  Hospitalist  Procedures:  Multiple during hospitalization  Antimicrobials: Unasyn   Subjective: Seen and examined.  Ill-appearing  Objective: Vitals:   05/13/23 1200 05/13/23 1230 05/13/23 1300 05/13/23 1330  BP: (!) 70/44 (!) 79/57 (!) 78/43 (!) 72/42  Pulse: 86 81 85 84  Resp: 17 14 14 17   Temp: 98.6 F (37 C)     TempSrc: Oral     SpO2: 98% 97% 97% 98%  Weight:      Height:        Intake/Output Summary (Last 24 hours) at 05/13/2023 1537 Last data filed at 05/13/2023 1204 Gross per 24 hour  Intake 5228.85 ml  Output 2685 ml  Net 2543.85 ml   Filed Weights   05/11/23 0336 05/12/23 0500 05/13/23 0416  Weight: 108.8 kg 106.4 kg 104.1 kg    Examination:  General exam: Appears fatigued.  Ill-appearing Respiratory system: Bibasilar crackles.  Normal work of breathing.  4 L Cardiovascular system: Tachycardic, regular rhythm, no murmurs Gastrointestinal system: Soft, mild TTP, nondistended Central nervous system:  Alert.  Oriented x 2 Extremities: Symmetric 5 x 5 power. Skin: No rashes, lesions or ulcers Psychiatry: Judgement and insight appear impaired. Mood & affect flattened.     Data Reviewed: I have personally reviewed following labs and  imaging studies  CBC: Recent Labs  Lab 05/07/23 0414 05/09/23 0754 05/11/23 0639 05/13/23 0504  WBC 6.5 4.7 3.8* 4.1  HGB 9.0* 9.5* 9.1* 8.0*  HCT 28.8* 30.6* 29.0* 24.5*  MCV 94.1 96.2 96.7 93.5  PLT 47* 56* 46* 39*   Basic Metabolic Panel: Recent Labs  Lab 05/09/23 0840 05/10/23 0359 05/10/23 1446 05/11/23 0639 05/12/23 0643 05/13/23 0504  NA 155* 157* 143 150* 148* 141  K 4.3 4.3  --  4.6 3.7 3.5  CL 120* 122*  --  117* 113* 106  CO2 27 26  --  26 28 27   GLUCOSE 204* 194*  --  145* 196* 259*  BUN 54* 41*  --  46* 45* 48*  CREATININE 0.82 0.62  --  0.90 0.99 1.08*  CALCIUM 8.1* 8.1*  --  7.8* 7.7* 7.5*  MG 2.8*  --   --  2.2  --   --   PHOS 2.3*  --   --  3.6  --   --    GFR: Estimated Creatinine Clearance: 59.9 mL/min (A) (by C-G formula based on SCr of 1.08 mg/dL (H)). Liver Function Tests: Recent Labs  Lab 05/09/23 0840 05/11/23 0639 05/13/23 0504  AST  --  91* 66*  ALT  --  98* 79*  ALKPHOS  --  153* 124  BILITOT  --  4.2* 4.9*  PROT  --  5.1* 5.0*  ALBUMIN 1.8* 1.7* <1.5*   No results for input(s): "LIPASE", "AMYLASE" in the last 168 hours. No results for input(s): "AMMONIA" in the last 168 hours. Coagulation Profile: Recent Labs  Lab 05/11/23 0639  INR 1.3*   Cardiac Enzymes: No results for input(s): "CKTOTAL", "CKMB", "CKMBINDEX", "TROPONINI" in the last 168 hours. BNP (last 3 results) No results for input(s): "PROBNP" in the last 8760 hours. HbA1C: No results for input(s): "HGBA1C" in the last 72 hours. CBG: Recent Labs  Lab 05/12/23 1937 05/12/23 2330 05/13/23 0310 05/13/23 0726 05/13/23 1133  GLUCAP 222* 189* 216* 191* 185*   Lipid Profile: No results for input(s): "CHOL", "HDL", "LDLCALC", "TRIG", "CHOLHDL", "LDLDIRECT" in the last 72 hours. Thyroid Function Tests: No results for input(s): "TSH", "T4TOTAL", "FREET4", "T3FREE", "THYROIDAB" in the last 72 hours. Anemia Panel: No results for input(s): "VITAMINB12", "FOLATE",  "FERRITIN", "TIBC", "IRON", "RETICCTPCT" in the last 72 hours. Sepsis Labs: No results for input(s): "PROCALCITON", "LATICACIDVEN" in the last 168 hours.  Recent Results (from the past 240 hour(s))  Culture, blood (Routine X 2) w Reflex to ID Panel     Status: None   Collection Time: 05/04/23  3:59 AM   Specimen: BLOOD LEFT ARM  Result Value Ref Range Status   Specimen Description BLOOD LEFT ARM  Final   Special Requests   Final    BOTTLES DRAWN AEROBIC AND ANAEROBIC Blood Culture results may not be optimal due to an inadequate volume of blood received in culture bottles   Culture   Final    NO GROWTH 5 DAYS Performed at [facility] [address], [place], [place] [address]    Report Status 05/09/2023 FINAL  Final  Culture, blood (Routine X 2) w Reflex to ID Panel     Status: None   Collection Time: 05/04/23  3:59  AM   Specimen: BLOOD LEFT HAND  Result Value Ref Range Status   Specimen Description BLOOD LEFT HAND  Final   Special Requests   Final    BOTTLES DRAWN AEROBIC ONLY Blood Culture results may not be optimal due to an inadequate volume of blood received in culture bottles   Culture   Final    NO GROWTH 5 DAYS Performed at [facility], [address] [place], [place] [address]    Report Status 05/09/2023 FINAL  Final         Radiology Studies: CT ABDOMEN PELVIS W CONTRAST  Result Date: 05/13/2023 CLINICAL DATA:  Postoperative abdominal pain. EXAM: CT ABDOMEN AND PELVIS WITH CONTRAST TECHNIQUE: Multidetector CT imaging of the abdomen and pelvis was performed using the standard protocol following bolus administration of intravenous contrast. RADIATION DOSE REDUCTION: This exam was performed according to the departmental dose-optimization program which includes automated exposure control, adjustment of the mA and/or kV according to patient size and/or use of iterative reconstruction technique. CONTRAST:  OMNIPAQUE IOHEXOL 300 MG/ML  SOLN  COMPARISON:  May 06, 2023. FINDINGS: Lower chest: Small bilateral pleural effusions are noted with adjacent subsegmental atelectasis. Hepatobiliary: No focal liver abnormality is seen. No gallstones, gallbladder wall thickening, or biliary dilatation. Pancreas: Unremarkable. No pancreatic ductal dilatation or surrounding inflammatory changes. Spleen: Normal in size without focal abnormality. Adrenals/Urinary Tract: Adrenal glands and kidneys appear normal. No hydronephrosis or renal obstruction is noted. Urinary bladder is decompressed secondary to Foley catheter. Stomach/Bowel: Nasogastric tube tip is seen in distal stomach. No evidence of bowel obstruction. Patient appears to be status post large and small bowel resection. Large midline surgical wound remains. At least 2 surgical drains remain anteriorly within the pelvis. There does appear to be high density material within the wound inferiorly suggesting enterocutaneous fistula, potentially present in the left lower quadrant. Vascular/Lymphatic: No significant vascular findings are present. No enlarged abdominal or pelvic lymph nodes. Reproductive: Uterus and bilateral adnexa are unremarkable. Other: No definite ascites or abscess is noted. Musculoskeletal: Old L1 compression fracture is noted. No acute osseous abnormality is noted. IMPRESSION: Small bilateral pleural effusions with adjacent subsegmental atelectasis. Status post both large and small bowel resection. Large midline surgical wound remains. Does appear to be high density material within the wound in its inferior portion suggesting enterocutaneous fistula, but it is difficult to determine exactly where the fistula is, but potentially may be in the left lower quadrant. Electronically Signed   By: Lupita Raider M.D.   On: 05/13/2023 14:37   DG Abd Portable 1V  Result Date: 05/12/2023 CLINICAL DATA:  [id number] Paralytic ileus of small intestine and colon (HCC) [id number] EXAM: PORTABLE ABDOMEN - 1  VIEW COMPARISON:  Abdominopelvic CT 05/06/2023. Radiographs 02/24/2022 and 04/28/2023. FINDINGS: 0924 hours. Two supine views of the abdomen demonstrate the tip of the enteric tube in the left mid abdomen, likely within the mid stomach. Pelvic surgical drains are in place. There has been no significant change in a moderately dilated loop of small bowel in the left mid abdomen compared with recent CT. No suspicious abdominal calcifications or significant osseous findings. IMPRESSION: No significant change in moderately dilated small bowel loop in the left mid abdomen. Enteric tube tip is in the mid stomach. Electronically Signed   By: Carey Bullocks M.D.   On: 05/12/2023 13:37        Scheduled Meds:  sodium chloride   Intravenous Once   alteplase  2 mg Intracatheter Once  Chlorhexidine Gluconate Cloth  6 each Topical Daily   heparin injection (subcutaneous)  5,000 Units Subcutaneous Q8H   insulin aspart  0-20 Units Subcutaneous Q4H   insulin aspart  4 Units Subcutaneous Q4H   insulin glargine-yfgn  22 Units Subcutaneous Daily   levothyroxine  137 mcg Per Tube Q0600   lidocaine  2 patch Transdermal Q24H   loperamide HCl  4 mg Per Tube BID   octreotide  200 mcg Subcutaneous Q8H   pantoprazole (PROTONIX) IV  40 mg Intravenous Q12H   pregabalin  200 mg Per Tube TID   sodium chloride flush  10-40 mL Intracatheter Q12H   Continuous Infusions:  acetaminophen Stopped (05/13/23 1143)   albumin human     ampicillin-sulbactam (UNASYN) IV 3 g (05/13/23 1204)   methocarbamol (ROBAXIN) IV     sodium chloride     TPN ADULT (ION) 65 mL/hr at 05/13/23 1200   TPN ADULT (ION)       LOS: 454 days     Tresa Moore, MD [facility]   If 7PM-7AM, please contact night-coverage  05/13/2023, 3:37 PM

## 2023-05-13 NOTE — Plan of Care (Signed)
  Problem: Clinical Measurements: Goal: Ability to maintain clinical measurements within normal limits will improve Outcome: Progressing Goal: Will remain free from infection Outcome: Progressing Goal: Diagnostic test results will improve Outcome: Progressing Goal: Respiratory complications will improve Outcome: Progressing   Problem: Clinical Measurements: Goal: Will remain free from infection Outcome: Progressing   Problem: Nutrition: Goal: Adequate nutrition will be maintained Outcome: Progressing   Problem: Activity: Goal: Risk for activity intolerance will decrease Outcome: Progressing   Problem: Respiratory: Goal: Ability to maintain a clear airway and adequate ventilation will improve Outcome: Progressing   Problem: Education: Goal: Ability to describe self-care measures that may prevent or decrease complications (Diabetes Survival Skills Education) will improve Outcome: Progressing

## 2023-05-13 NOTE — Progress Notes (Signed)
[facility] SURGICAL PROGRESS NOTE  Hospital Day(s): 454.   Post op day(s): 15 Days Post-Op.   Interval History:  Patient seen and examined No acute events or new complaints overnight.  Patient resting this morning; does open eyes briefly  WBC normalized; 4.1K Hgb to 8.0 (from 9.0) PLT 39K Renal function slightly elevated; sCr - 1.08; UO - 1600 cc Albumin <1.5 Sodium normalized 141 Bilirubin 4.9 NGT in place; minimal output; not recorded  Surgical drains as follow:   - LLQ (abdominal wall); 20 ccs; sero-feculent in appearance   - RLQ (Intra-abdominal); 390ccs; sero-feculent in appearance  Now with [name] w/ suction; output 800 ccs; sero-feculent in appearance  On Unasyn NPO + TPN   Vital signs in last 24 hours: [min-max] current  Temp:  [98.3 F (36.8 C)-99.7 F (37.6 C)] 99.2 F (37.3 C) (09/04 0400) Pulse Rate:  [81-88] 86 (09/04 0725) Resp:  [12-20] 20 (09/04 0725) BP: (74-103)/(36-59) 89/54 (09/04 0725) SpO2:  [95 %-100 %] 100 % (09/04 0725) Weight:  [104.1 kg] 104.1 kg (09/04 0416)     Height: 4\' 11"  (149.9 cm) Weight: 104.1 kg BMI (Calculated): 46.33   Intake/Output last 2 shifts:  09/03 0701 - 09/04 0700 In: 1467.7 [I.V.:1267.7; IV Piggyback:200] Out: 2810 [Urine:1600; Drains:410; Stool:800]   Physical Exam:  Constitutional: Resting in bed; NAD HEENT: NGT in place; minimal output Respiratory: breathing non-labored at rest; on [place] Cardiovascular: regular rate and sinus rhythm  Gastrointestinal: soft, and non-distended. LLQ drain (abdominal wall); serous output. RLQ drain (intra-abdominal); both with more sero-feculent appearance in drainage Integumentary: Midline wound now with [name] in place, Mesh visible. There appears to be output from left lateral edge, no visible bowel   Labs:     Latest Ref Rng & Units 05/13/2023    5:04 AM 05/11/2023    6:39 AM 05/09/2023    7:54 AM  CBC  WBC 4.0 - 10.5 K/uL 4.1  3.8  4.7   Hemoglobin 12.0 - 15.0 g/dL  8.0  9.1  9.5   Hematocrit 36.0 - 46.0 % 24.5  29.0  30.6   Platelets 150 - 400 K/uL 39  46  56       Latest Ref Rng & Units 05/13/2023    5:04 AM 05/12/2023    6:43 AM 05/11/2023    6:39 AM  CMP  Glucose 70 - 99 mg/dL 742  595  638   BUN 6 - 20 mg/dL 48  45  46   Creatinine 0.44 - 1.00 mg/dL 7.56  4.33  2.95   Sodium 135 - 145 mmol/L 141  148  150   Potassium 3.5 - 5.1 mmol/L 3.5  3.7  4.6   Chloride 98 - 111 mmol/L 106  113  117   CO2 22 - 32 mmol/L 27  28  26    Calcium 8.9 - 10.3 mg/dL 7.5  7.7  7.8   Total Protein 6.5 - 8.1 g/dL 5.0   5.1   Total Bilirubin 0.3 - 1.2 mg/dL 4.9   4.2   Alkaline Phos 38 - 126 U/L 124   153   AST 15 - 41 U/L 66   91   ALT 0 - 44 U/L 79   98     Imaging studies: No new pertinent imaging studies   Assessment/Plan:  57 y.o. female 15 Days Post-Op s/p exploratory laparotomy, extensive lysis of adhesion, 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, partial gastrectomy, abdominal wall reconstruction with mesh  placement, and wound vac placement.    - Will plan for repeat CT Abdomen/Pelvis with PO/IV contrast to ensure no other missed intra-abdominal etiology - Unfortunately, concerning for recurrent fistula vs leak, does appear to be distal based on output/imaging. Will continue [name] daily; red rubber to suction; do not place in wound bed itself  - Continue NPO; TPN at goal - Continue Octreotide  - Continue Abx; Unasyn on 08/29; complete 7 days total - Continue NGT; LIS - Continue surgical drains x2; monitor and record output  - Monitor abdominal examination  - Pain control prn; antiemetics prn  - Appreciate medicine assistance   -- [name], PA-C [facility] 05/13/2023, 8:04 AM M-F: 7am - 4pm

## 2023-05-13 NOTE — Progress Notes (Signed)
Physical Therapy Treatment Patient Details Name: Debra [name] MRN: [id number] DOB: 23-Sep-1965 Today's Date: 05/13/2023   History of Present Illness 57 y.o. female s/p laparotomy, excision of greater omental mass, abdominal wall reconstruction with [name], appendectomy, and placement of Prevena vac.    PT Comments  Pt seen for PT tx with co-tx with OT. Per OT, pt more alert compared to last therapy session. Pt able to state name, birthday, current location, follow simple commands inconsistently with extra time. PT/OT assisted pt with BLE exercises. Pt maintains RLE abduction throughout, despite PT/OT attempting to reposition RLE in bed. Attempted to upright HOB even further to have pt hold bed rails to allow [facility] to be lowered to maintain long sitting for decreased abdominal discomfort but pt unable to grasp bed rail to attempt. Did attempt chair position in bed but before obtaining position pt with c/o abdominal pain. Pt with low BP with slight improvement as session progressed. Will continue to follow pt acutely to progress mobility as able.   If plan is discharge home, recommend the following: Two people to help with walking and/or transfers;Two people to help with bathing/dressing/bathroom;Direct supervision/assist for financial management;Assist for transportation;Assistance with feeding;Assistance with cooking/housework;Direct supervision/assist for medications management;Help with stairs or ramp for entrance;Supervision due to cognitive status   Can travel by private vehicle     No  Equipment Recommendations  Other (comment) (defer to next venue)    Recommendations for Other Services       Precautions / Restrictions Precautions Precautions: Fall Precaution Comments: eakin pouch Restrictions Weight Bearing Restrictions: No     Mobility  Bed Mobility                    Transfers                        Ambulation/Gait                    Stairs             Wheelchair Mobility     Tilt Bed    Modified Rankin (Stroke Patients Only)       Balance                                            Cognition Arousal: Lethargic Behavior During Therapy: Flat affect Overall Cognitive Status: Impaired/Different from baseline Area of Impairment: Orientation, Attention, Memory, Safety/judgement, Awareness, Following commands, Problem solving                 Orientation Level: Situation, Disoriented to   Memory: Decreased short-term memory Following Commands: Follows one step commands inconsistently, Follows one step commands with increased time Safety/Judgement: Decreased awareness of safety, Decreased awareness of deficits Awareness: Intellectual Problem Solving: Slow processing, Decreased initiation, Difficulty sequencing, Requires verbal cues, Requires tactile cues          Exercises Other Exercises Other Exercises: Pt performs BLE ankle pumps x 10 reps with max cuing for sustained attention to task. OT assisted pt with BLE straight leg raises x 5 reps.    General Comments        Pertinent Vitals/Pain Pain Assessment Pain Assessment: Faces Faces Pain Scale: Hurts a little bit Pain Location: abdomen Pain Descriptors / Indicators: Grimacing, Guarding, Discomfort Pain Intervention(s): Monitored during session, Repositioned, Limited activity within  patient's tolerance    Home Living                          Prior Function            PT Goals (current goals can now be found in the care plan section) Acute Rehab PT Goals Patient Stated Goal: did not state PT Goal Formulation: With patient/family Time For Goal Achievement: 05/21/23 Potential to Achieve Goals: Fair Progress towards PT goals: Progressing toward goals    Frequency    Min 1X/week      PT Plan Current plan remains appropriate    Co-evaluation PT/OT/SLP Co-Evaluation/Treatment:  Yes Reason for Co-Treatment: Complexity of the patient's impairments (multi-system involvement);For patient/therapist safety;Necessary to address cognition/behavior during functional activity PT goals addressed during session: Mobility/safety with mobility;Strengthening/ROM        AM-PAC PT "6 Clicks" Mobility   Outcome Measure  Help needed turning from your back to your side while in a flat bed without using bedrails?: Total Help needed moving from lying on your back to sitting on the side of a flat bed without using bedrails?: Total Help needed moving to and from a bed to a chair (including a wheelchair)?: Total Help needed standing up from a chair using your arms (e.g., wheelchair or bedside chair)?: Total Help needed to walk in hospital room?: Total Help needed climbing 3-5 steps with a railing? : Total 6 Click Score: 6    End of Session Equipment Utilized During Treatment: Oxygen Activity Tolerance: Patient limited by fatigue;Patient limited by lethargy Patient left: in bed;with call bell/phone within reach;with bed alarm set Nurse Communication: Mobility status PT Visit Diagnosis: Muscle weakness (generalized) (M62.81);Other abnormalities of gait and mobility (R26.89);Difficulty in walking, not elsewhere classified (R26.2)     Time: 7829-5621 PT Time Calculation (min) (ACUTE ONLY): 24 min  Charges:    $Therapeutic Activity: 8-22 mins PT General Charges $$ ACUTE PT VISIT: 1 Visit                     Aleda Grana, PT, DPT 05/13/23, 12:26 PM   Sandi Mariscal 05/13/2023, 12:24 PM

## 2023-05-13 NOTE — Progress Notes (Signed)
PHARMACY - TOTAL PARENTERAL NUTRITION CONSULT NOTE   Indication: Prolonged ileus  Patient Measurements: Height: 4\' 11"  (149.9 cm) Weight: 104.1 kg (229 lb 8 oz) IBW/kg (Calculated) : 43.2 TPN AdjBW (KG): 59 Body mass index is 46.35 kg/m.  Assessment: Debra [name] is a 57 y.o. female s/p laparotomy, excision of greater omental mass, abdominal wall reconstruction with [name], appendectomy, and placement of Prevena vac.  Glucose / Insulin:  BG last 24h: 112 - 222 SSI last 24h: 26u rSSI q4h + Novolog 4u q4h + Semglee 22u daily + insulin removed from TPN 8/26 Electrolytes: Hypernatremia, hyperchloremia, hypermagnesemia, hypophosphatemia Renal: SCr <1, fairly stable Hepatic: AST/ALT trending up I&O:  GI Imaging: 9/11 CTAP: no new acute issues GI Surgeries / Procedures:  02/15/22: S/p laparotomy, excision of greater omental mass, abdominal wall reconstruction with [name], appendectomy, and placement of Prevena vac 12/19/22: Placement of right internal jugular port-a-catheter with dual reservoirs also s/p re-opening of laparotomy for repair of small bowel perforation  04/28/23:  Exploratory laparotomy, extensive lysis of adhesion, 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, partial gastrectomy, abdominal wall reconstruction with mesh placement, and wound vac placement   Central access: PICC 04/09/2023   TPN start date: 04/09/23 TPN from 02/15/2022 to 04/05/2023 - held for bacteremia and TPN restarted 04/09/23 Previously Cyclic TPN, transitioned back to regular TPN on 04/29/23 post surgery  RD Assessment:  Estimated Needs Total Energy Estimated Needs: 1900-2200kcal/day Total Protein Estimated Needs: 95-110g/day Total Fluid Estimated Needs: 1.4-1.6L/day  Current Nutrition: NPO  Plan:  --Continue TPN at new goal rate of 65 mL/hr --Electrolytes in TPN: Na 5 mEq/L(decreased on 9/1&2), K 55mEq/L, Ca 64mEq/L, Mg 91mEq/L(removed on 8/31), and  Phos 105mmol/L(increased on 8/31) --Acetate maximized as chloride levels remain elevated --Continue multivitamin, trace elements, zinc, selenium and chromium chloride to TPN MD will manage glucose via [place] insulin regimen --Monitor TPN labs daily until stable then on Mon/Thurs  Debra [name] 05/13/2023 7:11 AM

## 2023-05-13 NOTE — Progress Notes (Signed)
Occupational Therapy Treatment Patient Details Name: Debra [name] MRN: [id number] DOB: 26-Dec-1965 Today's Date: 05/13/2023   History of present illness 57 y.o. female s/p laparotomy, excision of greater omental mass, abdominal wall reconstruction with [name], appendectomy, and placement of Prevena vac.   OT comments  Chart reviewed to date ,nurse cleared pt for participation in OT/PT co tx targeting improving tolerance, attention to task for eventual prep for increased functional mobility/participation in ADL tasks. Hospital interpreter utilized throughout. Pt presented with improved cognition/attention, following one step directions with increased time, visually tracking throughout room. Pt able to participate in LB exercises with PT and attempts to bring R hand to face to participate in grooming/self feeding ice chips. Unable to tolerate chair position on this date. BP 99/61 (MAP 71) at end of session. OT will continue to follow acutely.       If plan is discharge home, recommend the following:  Assistance with cooking/housework;Direct supervision/assist for medications management;Assist for transportation;Help with stairs or ramp for entrance;Two people to help with walking and/or transfers;Two people to help with bathing/dressing/bathroom;Direct supervision/assist for financial management   Equipment Recommendations  Other (comment) (defer)    Recommendations for Other Services      Precautions / Restrictions Precautions Precautions: Fall Precaution Comments: eakin pouch, NG, TPN Restrictions Weight Bearing Restrictions: No       Mobility Bed Mobility Overal bed mobility: Needs Assistance             General bed mobility comments: attempted supine>long sit, with poor tolerance requiring MAX-TOTAL A  +2    Transfers                         Balance Overall balance assessment: Needs assistance   Sitting balance-Leahy Scale: Zero                                      ADL either performed or assessed with clinical judgement   ADL Overall ADL's : Needs assistance/impaired Eating/Feeding: Moderate assistance;Maximal assistance;Cueing for sequencing Eating/Feeding Details (indicate cue type and reason): attempts to bring cup to mouth with ice chips Grooming: Wash/dry face;Moderate assistance;Maximal assistance Grooming Details (indicate cue type and reason): with R hand             Lower Body Dressing: Total assistance                      Extremity/Trunk Assessment              Vision       Perception     Praxis      Cognition Arousal: Lethargic Behavior During Therapy: Flat affect Overall Cognitive Status: Impaired/Different from baseline Area of Impairment: Orientation, Attention, Memory, Safety/judgement, Awareness, Following commands, Problem solving                 Orientation Level: Disoriented to, Place, Time, Situation Current Attention Level: Focused Memory: Decreased short-term memory Following Commands: Follows one step commands inconsistently, Follows one step commands with increased time Safety/Judgement: Decreased awareness of safety, Decreased awareness of deficits Awareness: Intellectual Problem Solving: Slow processing, Decreased initiation, Difficulty sequencing, Requires verbal cues, Requires tactile cues          Exercises      Shoulder Instructions       General Comments all llines/leads/ng, eakin pouch intact pre/post session  Pertinent Vitals/ Pain       Pain Assessment Pain Assessment: CPOT Facial Expression: Relaxed, neutral Body Movements: Absence of movements Muscle Tension: Relaxed Compliance with ventilator (intubated pts.): N/A Vocalization (extubated pts.): Sighing, moaning CPOT Total: 1 Pain Location: generalized Pain Descriptors / Indicators: Grimacing, Guarding, Discomfort Pain Intervention(s): Limited activity within  patient's tolerance, Monitored during session, Repositioned  Home Living                                          Prior Functioning/Environment              Frequency  Min 1X/week        Progress Toward Goals  OT Goals(current goals can now be found in the care plan section)  Progress towards OT goals: Progressing toward goals     Plan      Co-evaluation    PT/OT/SLP Co-Evaluation/Treatment: Yes Reason for Co-Treatment: Complexity of the patient's impairments (multi-system involvement);For patient/therapist safety;Necessary to address cognition/behavior during functional activity PT goals addressed during session: Mobility/safety with mobility;Strengthening/ROM OT goals addressed during session: ADL's and self-care      AM-PAC OT "6 Clicks" Daily Activity     Outcome Measure   Help from another person eating meals?: A Lot Help from another person taking care of personal grooming?: A Lot Help from another person toileting, which includes using toliet, bedpan, or urinal?: Total Help from another person bathing (including washing, rinsing, drying)?: Total Help from another person to put on and taking off regular upper body clothing?: Total Help from another person to put on and taking off regular lower body clothing?: Total 6 Click Score: 8    End of Session Equipment Utilized During Treatment: Oxygen  OT Visit Diagnosis: Unsteadiness on feet (R26.81);Muscle weakness (generalized) (M62.81)   Activity Tolerance Patient limited by lethargy   Patient Left in bed;with call bell/phone within reach;with bed alarm set   Nurse Communication Mobility status        Time: 5409-8119 OT Time Calculation (min): 25 min  Charges: OT General Charges $OT Visit: 1 Visit OT Treatments $Self Care/Home Management : 8-22 mins  Oleta Mouse, OTD OTR/L  05/13/23, 12:46 PM

## 2023-05-14 ENCOUNTER — Inpatient Hospital Stay: Payer: Medicaid Other | Admitting: Certified Registered"

## 2023-05-14 ENCOUNTER — Inpatient Hospital Stay: Payer: Medicaid Other

## 2023-05-14 ENCOUNTER — Other Ambulatory Visit: Payer: Self-pay

## 2023-05-14 ENCOUNTER — Encounter: Admission: RE | Disposition: E | Payer: Self-pay | Source: Home / Self Care | Attending: Surgery

## 2023-05-14 ENCOUNTER — Other Ambulatory Visit: Payer: Self-pay | Admitting: Surgery

## 2023-05-14 DIAGNOSIS — T8112XA Postprocedural septic shock, initial encounter: Secondary | ICD-10-CM

## 2023-05-14 HISTORY — PX: APPLICATION OF WOUND VAC: SHX5189

## 2023-05-14 HISTORY — PX: BOWEL RESECTION: SHX1257

## 2023-05-14 HISTORY — PX: LAPAROTOMY: SHX154

## 2023-05-14 HISTORY — PX: COLOSTOMY: SHX63

## 2023-05-14 HISTORY — PX: ILEOSTOMY: SHX1783

## 2023-05-14 HISTORY — PX: EXCISION OF MESH: SHX6268

## 2023-05-14 LAB — BASIC METABOLIC PANEL
Anion gap: 10 (ref 5–15)
Anion gap: 8 (ref 5–15)
Anion gap: 10 (ref 5–15)
BUN: 43 mg/dL — ABNORMAL HIGH (ref 6–20)
BUN: 33 mg/dL — ABNORMAL HIGH (ref 6–20)
BUN: 31 mg/dL — ABNORMAL HIGH (ref 6–20)
CO2: 21 mmol/L — ABNORMAL LOW (ref 22–32)
CO2: 19 mmol/L — ABNORMAL LOW (ref 22–32)
CO2: 20 mmol/L — ABNORMAL LOW (ref 22–32)
Calcium: 7.1 mg/dL — ABNORMAL LOW (ref 8.9–10.3)
Calcium: 7.1 mg/dL — ABNORMAL LOW (ref 8.9–10.3)
Calcium: 6.5 mg/dL — ABNORMAL LOW (ref 8.9–10.3)
Chloride: 99 mmol/L (ref 98–111)
Chloride: 99 mmol/L (ref 98–111)
Chloride: 95 mmol/L — ABNORMAL LOW (ref 98–111)
Creatinine, Ser: 0.87 mg/dL (ref 0.44–1.00)
Creatinine, Ser: 0.82 mg/dL (ref 0.44–1.00)
Creatinine, Ser: 0.78 mg/dL (ref 0.44–1.00)
GFR, Estimated: >60 mL/min (ref >60)
GFR, Estimated: >60 mL/min (ref >60)
GFR, Estimated: >60 mL/min (ref >60)
Glucose, Bld: 291 mg/dL — ABNORMAL HIGH (ref 70–99)
Glucose, Bld: 454 mg/dL — ABNORMAL HIGH (ref 70–99)
Glucose, Bld: 469 mg/dL — ABNORMAL HIGH (ref 70–99)
Potassium: 4.1 mmol/L (ref 3.5–5.1)
Potassium: 3.2 mmol/L — ABNORMAL LOW (ref 3.5–5.1)
Potassium: 3.4 mmol/L — ABNORMAL LOW (ref 3.5–5.1)
Sodium: 134 mmol/L — ABNORMAL LOW (ref 135–145)
Sodium: 128 mmol/L — ABNORMAL LOW (ref 135–145)
Sodium: 125 mmol/L — ABNORMAL LOW (ref 135–145)

## 2023-05-14 LAB — CBC WITH DIFFERENTIAL/PLATELET
Abs Immature Granulocytes: 0.8 10*3/uL — ABNORMAL HIGH (ref 0.00–0.07)
Basophils Absolute: 0 10*3/uL (ref 0.0–0.1)
Basophils Relative: 0 %
Eosinophils Absolute: 0.1 10*3/uL (ref 0.0–0.5)
Eosinophils Relative: 1 %
HCT: 23.1 % — ABNORMAL LOW (ref 36.0–46.0)
Hemoglobin: 7.9 g/dL — ABNORMAL LOW (ref 12.0–15.0)
Immature Granulocytes: 9 %
Lymphocytes Relative: 7 %
Lymphs Abs: 0.6 10*3/uL — ABNORMAL LOW (ref 0.7–4.0)
MCH: 30.6 pg (ref 26.0–34.0)
MCHC: 34.2 g/dL (ref 30.0–36.0)
MCV: 89.5 fL (ref 80.0–100.0)
Monocytes Absolute: 0.5 10*3/uL (ref 0.1–1.0)
Monocytes Relative: 6 %
Neutro Abs: 6.6 10*3/uL (ref 1.7–7.7)
Neutrophils Relative %: 77 %
Platelets: 60 10*3/uL — ABNORMAL LOW (ref 150–400)
RBC: 2.58 MIL/uL — ABNORMAL LOW (ref 3.87–5.11)
RDW: 19.6 % — ABNORMAL HIGH (ref 11.5–15.5)
Smear Review: DECREASED
WBC: 8.6 10*3/uL (ref 4.0–10.5)
nRBC: 2.3 % — ABNORMAL HIGH (ref 0.0–0.2)

## 2023-05-14 LAB — BLOOD GAS, VENOUS
Acid-base deficit: 7.4 mmol/L — ABNORMAL HIGH (ref 0.0–2.0)
Bicarbonate: 20.5 mmol/L (ref 20.0–28.0)
FIO2: 40 %
MECHVT: 360 mL
Mechanical Rate: 24
O2 Saturation: 87 %
PEEP: 5 cmH2O
Patient temperature: 37
pCO2, Ven: 50 mmHg (ref 44–60)
pH, Ven: 7.22 — ABNORMAL LOW (ref 7.25–7.43)
pO2, Ven: 50 mmHg — ABNORMAL HIGH (ref 32–45)

## 2023-05-14 LAB — CBC
HCT: 28.9 % — ABNORMAL LOW (ref 36.0–46.0)
HCT: 29.9 % — ABNORMAL LOW (ref 36.0–46.0)
Hemoglobin: 9.1 g/dL — ABNORMAL LOW (ref 12.0–15.0)
Hemoglobin: 9.8 g/dL — ABNORMAL LOW (ref 12.0–15.0)
MCH: 30.1 pg (ref 26.0–34.0)
MCH: 29.9 pg (ref 26.0–34.0)
MCHC: 31.5 g/dL (ref 30.0–36.0)
MCHC: 32.8 g/dL (ref 30.0–36.0)
MCV: 95.7 fL (ref 80.0–100.0)
MCV: 91.2 fL (ref 80.0–100.0)
Platelets: 53 10*3/uL — ABNORMAL LOW (ref 150–400)
Platelets: 121 10*3/uL — ABNORMAL LOW (ref 150–400)
RBC: 3.02 MIL/uL — ABNORMAL LOW (ref 3.87–5.11)
RBC: 3.28 MIL/uL — ABNORMAL LOW (ref 3.87–5.11)
RDW: 24.6 % — ABNORMAL HIGH (ref 11.5–15.5)
RDW: 19.4 % — ABNORMAL HIGH (ref 11.5–15.5)
WBC: 12 10*3/uL — ABNORMAL HIGH (ref 4.0–10.5)
WBC: 12.7 10*3/uL — ABNORMAL HIGH (ref 4.0–10.5)
nRBC: 0.9 % — ABNORMAL HIGH (ref 0.0–0.2)
nRBC: 4 % — ABNORMAL HIGH (ref 0.0–0.2)

## 2023-05-14 LAB — PROTIME-INR
INR: 1.9 — ABNORMAL HIGH (ref 0.8–1.2)
INR: 1.9 — ABNORMAL HIGH (ref 0.8–1.2)
Prothrombin Time: 21.8 s — ABNORMAL HIGH (ref 11.4–15.2)
Prothrombin Time: 22.4 s — ABNORMAL HIGH (ref 11.4–15.2)

## 2023-05-14 LAB — APTT
aPTT: 44 s — ABNORMAL HIGH (ref 24–36)
aPTT: 44 s — ABNORMAL HIGH (ref 24–36)

## 2023-05-14 LAB — LACTIC ACID, PLASMA: Lactic Acid, Venous: 3.2 mmol/L (ref 0.5–1.9)

## 2023-05-14 LAB — GLUCOSE, CAPILLARY
Glucose-Capillary: 245 mg/dL — ABNORMAL HIGH (ref 70–99)
Glucose-Capillary: 260 mg/dL — ABNORMAL HIGH (ref 70–99)
Glucose-Capillary: 238 mg/dL — ABNORMAL HIGH (ref 70–99)
Glucose-Capillary: 208 mg/dL — ABNORMAL HIGH (ref 70–99)
Glucose-Capillary: 196 mg/dL — ABNORMAL HIGH (ref 70–99)
Glucose-Capillary: 247 mg/dL — ABNORMAL HIGH (ref 70–99)

## 2023-05-14 LAB — PREPARE RBC (CROSSMATCH)

## 2023-05-14 SURGERY — LAPAROTOMY, EXPLORATORY
Anesthesia: General

## 2023-05-14 MED ORDER — NOREPINEPHRINE 4 MG/250ML-% IV SOLN
INTRAVENOUS | Status: AC
  Filled 2023-05-14: qty 250

## 2023-05-14 MED ORDER — ROCURONIUM BROMIDE 100 MG/10ML IV SOLN
INTRAVENOUS | Status: DC | PRN
  Administered 2023-05-14: 50 mg via INTRAVENOUS
  Administered 2023-05-14: 40 mg via INTRAVENOUS
  Administered 2023-05-14: 60 mg via INTRAVENOUS

## 2023-05-14 MED ORDER — HEMOSTATIC AGENTS (NO CHARGE) OPTIME
TOPICAL | Status: DC | PRN
Start: 2023-05-14 — End: 2023-05-14
  Administered 2023-05-14: 1 via TOPICAL

## 2023-05-14 MED ORDER — TRANEXAMIC ACID 1000 MG/10ML IV SOLN
INTRAVENOUS | Status: DC | PRN
  Administered 2023-05-14: 1000 mg via INTRAVENOUS

## 2023-05-14 MED ORDER — PHENYLEPHRINE 80 MCG/ML (10ML) SYRINGE FOR IV PUSH (FOR BLOOD PRESSURE SUPPORT)
PREFILLED_SYRINGE | INTRAVENOUS | Status: DC | PRN
Start: 2023-05-14 — End: 2023-05-14
  Administered 2023-05-14: 80 ug via INTRAVENOUS
  Administered 2023-05-14: 160 ug via INTRAVENOUS

## 2023-05-14 MED ORDER — ETOMIDATE 2 MG/ML IV SOLN
INTRAVENOUS | Status: DC | PRN
Start: 2023-05-14 — End: 2023-05-14
  Administered 2023-05-14: 20 mg via INTRAVENOUS

## 2023-05-14 MED ORDER — DEXAMETHASONE SODIUM PHOSPHATE 10 MG/ML IJ SOLN
INTRAMUSCULAR | Status: AC
  Filled 2023-05-14: qty 1

## 2023-05-14 MED ORDER — POLYETHYLENE GLYCOL 3350 17 G PO PACK
17.0000 g | PACK | Freq: Every day | ORAL | Status: DC
  Filled 2023-05-14 (×2): qty 1

## 2023-05-14 MED ORDER — CALCIUM CHLORIDE 10 % IV SOLN
INTRAVENOUS | Status: DC | PRN
  Administered 2023-05-14: 500 mg via INTRAVENOUS

## 2023-05-14 MED ORDER — DEXTROSE 50 % IV SOLN
0.0000 mL | INTRAVENOUS | Status: DC | PRN
  Administered 2023-05-15: 25 mL via INTRAVENOUS
  Filled 2023-05-14: qty 50

## 2023-05-14 MED ORDER — FENTANYL 2500MCG IN NS 250ML (10MCG/ML) PREMIX INFUSION
50.0000 ug/h | INTRAVENOUS | Status: DC
  Administered 2023-05-15: 175 ug/h via INTRAVENOUS
  Administered 2023-05-15: 75 ug/h via INTRAVENOUS
  Administered 2023-05-16 – 2023-05-17 (×3): 200 ug/h via INTRAVENOUS
  Filled 2023-05-14 (×5): qty 250

## 2023-05-14 MED ORDER — PHENYLEPHRINE HCL-NACL 20-0.9 MG/250ML-% IV SOLN
INTRAVENOUS | Status: DC | PRN
Start: 2023-05-14 — End: 2023-05-14
  Administered 2023-05-14: 30 ug/min via INTRAVENOUS

## 2023-05-14 MED ORDER — INSULIN REGULAR(HUMAN) IN NACL 100-0.9 UT/100ML-% IV SOLN
INTRAVENOUS | Status: DC
  Administered 2023-05-14: 6 [IU]/h via INTRAVENOUS
  Administered 2023-05-15: 13 [IU]/h via INTRAVENOUS
  Administered 2023-05-15: 6.5 [IU]/h via INTRAVENOUS
  Administered 2023-05-16 – 2023-05-17 (×2): 7 [IU]/h via INTRAVENOUS
  Filled 2023-05-14 (×5): qty 100

## 2023-05-14 MED ORDER — DEXAMETHASONE SODIUM PHOSPHATE 10 MG/ML IJ SOLN
INTRAMUSCULAR | Status: DC | PRN
  Administered 2023-05-14: 4 mg via INTRAVENOUS

## 2023-05-14 MED ORDER — POTASSIUM CHLORIDE 10 MEQ/50ML IV SOLN
10.0000 meq | INTRAVENOUS | Status: AC
  Administered 2023-05-14 (×4): 10 meq via INTRAVENOUS
  Filled 2023-05-14 (×4): qty 50

## 2023-05-14 MED ORDER — PROPOFOL 500 MG/50ML IV EMUL
INTRAVENOUS | Status: DC | PRN
  Administered 2023-05-14: 75 ug/kg/min via INTRAVENOUS

## 2023-05-14 MED ORDER — VASOPRESSIN 20 UNIT/ML IV SOLN
INTRAVENOUS | Status: AC
  Filled 2023-05-14: qty 1

## 2023-05-14 MED ORDER — CALCIUM CHLORIDE 10 % IV SOLN
INTRAVENOUS | Status: AC
  Filled 2023-05-14: qty 10

## 2023-05-14 MED ORDER — ROCURONIUM BROMIDE 10 MG/ML (PF) SYRINGE
PREFILLED_SYRINGE | INTRAVENOUS | Status: AC
  Filled 2023-05-14: qty 10

## 2023-05-14 MED ORDER — STERILE WATER FOR IRRIGATION IR SOLN
Status: DC | PRN
Start: 2023-05-14 — End: 2023-05-14
  Administered 2023-05-14: 2000 mL
  Administered 2023-05-14: 5500 mL

## 2023-05-14 MED ORDER — MIDAZOLAM HCL 2 MG/2ML IJ SOLN
INTRAMUSCULAR | Status: AC
  Filled 2023-05-14: qty 2

## 2023-05-14 MED ORDER — SODIUM CHLORIDE 0.9 % IV SOLN: INTRAVENOUS | Status: DC

## 2023-05-14 MED ORDER — SODIUM CHLORIDE 0.9 % IV SOLN: INTRAVENOUS | Status: DC | PRN

## 2023-05-14 MED ORDER — VASOPRESSIN 20 UNIT/ML IV SOLN
INTRAVENOUS | Status: DC | PRN
Start: 2023-05-14 — End: 2023-05-14
  Administered 2023-05-14: 1 [IU] via INTRAVENOUS

## 2023-05-14 MED ORDER — FENTANYL CITRATE PF 50 MCG/ML IJ SOSY: 50.0000 ug | PREFILLED_SYRINGE | Freq: Once | INTRAMUSCULAR | Status: AC

## 2023-05-14 MED ORDER — SODIUM CHLORIDE 0.9% IV SOLUTION: Freq: Once | INTRAVENOUS | Status: AC

## 2023-05-14 MED ORDER — TRANEXAMIC ACID-NACL 1000-0.7 MG/100ML-% IV SOLN
INTRAVENOUS | Status: AC
  Filled 2023-05-14: qty 200

## 2023-05-14 MED ORDER — FENTANYL BOLUS VIA INFUSION: 50.0000 ug | INTRAVENOUS | Status: DC | PRN

## 2023-05-14 MED ORDER — LACTATED RINGERS IV BOLUS
500.0000 mL | Freq: Once | INTRAVENOUS | Status: AC
  Administered 2023-05-14: 500 mL via INTRAVENOUS

## 2023-05-14 MED ORDER — SUCCINYLCHOLINE CHLORIDE 200 MG/10ML IV SOSY
PREFILLED_SYRINGE | INTRAVENOUS | Status: AC
  Filled 2023-05-14: qty 10

## 2023-05-14 MED ORDER — PROPOFOL 1000 MG/100ML IV EMUL
INTRAVENOUS | Status: AC
  Administered 2023-05-14: 50 ug/kg/min via INTRAVENOUS
  Filled 2023-05-14: qty 100

## 2023-05-14 MED ORDER — FENTANYL 2500MCG IN NS 250ML (10MCG/ML) PREMIX INFUSION
INTRAVENOUS | Status: AC
  Administered 2023-05-14: 125 ug/h via INTRAVENOUS
  Filled 2023-05-14: qty 250

## 2023-05-14 MED ORDER — MIDAZOLAM HCL 2 MG/2ML IJ SOLN: 1.0000 mg | INTRAMUSCULAR | Status: DC | PRN

## 2023-05-14 MED ORDER — PIPERACILLIN-TAZOBACTAM 3.375 G IVPB
3.3750 g | Freq: Three times a day (TID) | INTRAVENOUS | Status: DC
  Administered 2023-05-15 – 2023-05-17 (×9): 3.375 g via INTRAVENOUS
  Filled 2023-05-14 (×10): qty 50

## 2023-05-14 MED ORDER — ZINC CHLORIDE 1 MG/ML IV SOLN
INTRAVENOUS | Status: AC
  Filled 2023-05-14: qty 707.2

## 2023-05-14 MED ORDER — PHENYLEPHRINE HCL-NACL 20-0.9 MG/250ML-% IV SOLN
INTRAVENOUS | Status: AC
  Filled 2023-05-14: qty 250

## 2023-05-14 MED ORDER — FLUCONAZOLE IN SODIUM CHLORIDE 200-0.9 MG/100ML-% IV SOLN
200.0000 mg | INTRAVENOUS | Status: DC
  Administered 2023-05-14 – 2023-05-17 (×4): 200 mg via INTRAVENOUS
  Filled 2023-05-14 (×4): qty 100

## 2023-05-14 MED ORDER — ALBUMIN HUMAN 5 % IV SOLN
INTRAVENOUS | Status: DC | PRN
Start: 2023-05-14 — End: 2023-05-14

## 2023-05-14 MED ORDER — 0.9 % SODIUM CHLORIDE (POUR BTL) OPTIME
TOPICAL | Status: DC | PRN
  Administered 2023-05-14: 500 mL

## 2023-05-14 MED ORDER — ALBUTEROL SULFATE HFA 108 (90 BASE) MCG/ACT IN AERS
INHALATION_SPRAY | RESPIRATORY_TRACT | Status: AC
  Filled 2023-05-14: qty 6.7

## 2023-05-14 MED ORDER — ETOMIDATE 2 MG/ML IV SOLN
INTRAVENOUS | Status: AC
  Filled 2023-05-14: qty 10

## 2023-05-14 MED ORDER — LIDOCAINE HCL (CARDIAC) PF 100 MG/5ML IV SOSY
PREFILLED_SYRINGE | INTRAVENOUS | Status: DC | PRN
  Administered 2023-05-14: 80 mg via INTRAVENOUS

## 2023-05-14 MED ORDER — NOREPINEPHRINE 16 MG/250ML-% IV SOLN
0.0000 ug/min | INTRAVENOUS | Status: DC
  Administered 2023-05-14: 18 ug/min via INTRAVENOUS
  Administered 2023-05-15: 12 ug/min via INTRAVENOUS
  Administered 2023-05-16: 13 ug/min via INTRAVENOUS
  Administered 2023-05-17: 23 ug/min via INTRAVENOUS
  Administered 2023-05-17: 25 ug/min via INTRAVENOUS
  Administered 2023-05-18: 23 ug/min via INTRAVENOUS
  Filled 2023-05-14 (×6): qty 250

## 2023-05-14 MED ORDER — PROPOFOL 1000 MG/100ML IV EMUL
5.0000 ug/kg/min | INTRAVENOUS | Status: DC
  Administered 2023-05-14: 50 ug/kg/min via INTRAVENOUS
  Administered 2023-05-15: 20 ug/kg/min via INTRAVENOUS
  Administered 2023-05-15: 30 ug/kg/min via INTRAVENOUS
  Administered 2023-05-15: 50 ug/kg/min via INTRAVENOUS
  Filled 2023-05-14 (×4): qty 100

## 2023-05-14 MED ORDER — DOCUSATE SODIUM 50 MG/5ML PO LIQD
100.0000 mg | Freq: Two times a day (BID) | ORAL | Status: DC
  Filled 2023-05-14 (×2): qty 10

## 2023-05-14 MED ORDER — PROPOFOL 1000 MG/100ML IV EMUL
INTRAVENOUS | Status: AC
  Filled 2023-05-14: qty 100

## 2023-05-14 MED ORDER — SODIUM CHLORIDE 0.9% IV SOLUTION: Freq: Once | INTRAVENOUS | Status: DC

## 2023-05-14 MED ORDER — ALBUMIN HUMAN 25 % IV SOLN
12.5000 g | Freq: Four times a day (QID) | INTRAVENOUS | Status: DC
  Administered 2023-05-14 – 2023-05-17 (×13): 12.5 g via INTRAVENOUS
  Filled 2023-05-14 (×13): qty 50

## 2023-05-14 MED ORDER — SODIUM CHLORIDE 0.9 % IV BOLUS
1000.0000 mL | Freq: Once | INTRAVENOUS | Status: AC
  Administered 2023-05-14: 1000 mL via INTRAVENOUS

## 2023-05-14 MED ORDER — LIDOCAINE HCL (PF) 2 % IJ SOLN
INTRAMUSCULAR | Status: AC
  Filled 2023-05-14: qty 5

## 2023-05-14 MED ORDER — PROPOFOL 10 MG/ML IV BOLUS
INTRAVENOUS | Status: AC
  Filled 2023-05-14: qty 20

## 2023-05-14 MED ORDER — ALBUMIN HUMAN 5 % IV SOLN
INTRAVENOUS | Status: AC
  Filled 2023-05-14: qty 500

## 2023-05-14 MED ORDER — VASOPRESSIN 20 UNITS/100 ML INFUSION FOR SHOCK
0.0000 [IU]/min | INTRAVENOUS | Status: DC
  Administered 2023-05-14 – 2023-05-15 (×2): 0.03 [IU]/min via INTRAVENOUS
  Filled 2023-05-14 (×2): qty 100

## 2023-05-14 MED ORDER — ONDANSETRON HCL 4 MG/2ML IJ SOLN
INTRAMUSCULAR | Status: AC
  Filled 2023-05-14: qty 2

## 2023-05-14 MED ORDER — FENTANYL CITRATE (PF) 100 MCG/2ML IJ SOLN
INTRAMUSCULAR | Status: AC
  Filled 2023-05-14: qty 2

## 2023-05-14 MED ORDER — LACTATED RINGERS IV SOLN: INTRAVENOUS | Status: DC | PRN

## 2023-05-14 MED ORDER — ONDANSETRON HCL 4 MG/2ML IJ SOLN
INTRAMUSCULAR | Status: DC | PRN
  Administered 2023-05-14: 4 mg via INTRAVENOUS

## 2023-05-14 MED ORDER — ALBUMIN HUMAN 25 % IV SOLN
25.0000 g | Freq: Once | INTRAVENOUS | Status: AC
  Administered 2023-05-14: 25 g via INTRAVENOUS
  Filled 2023-05-14: qty 100

## 2023-05-14 MED ORDER — SUCCINYLCHOLINE CHLORIDE 200 MG/10ML IV SOSY
PREFILLED_SYRINGE | INTRAVENOUS | Status: DC | PRN
  Administered 2023-05-14: 100 mg via INTRAVENOUS

## 2023-05-14 MED ORDER — ALBUMIN HUMAN 25 % IV SOLN: 12.5000 g | Freq: Two times a day (BID) | INTRAVENOUS | Status: DC

## 2023-05-14 SURGICAL SUPPLY — 36 items
ADH LQ OCL WTPRF AMP STRL LF (MISCELLANEOUS) ×8
ADHESIVE MASTISOL STRL (MISCELLANEOUS) IMPLANT
BARRIER SKIN 2 RING SOFTFLEX (WOUND CARE) IMPLANT
BULB RESERV EVAC DRAIN JP 100C (MISCELLANEOUS) IMPLANT
DRAIN PENROSE 5/8X18 LTX STRL (DRAIN) IMPLANT
DRAPE LAPAROTOMY 100X77 ABD (DRAPES) ×2 IMPLANT
ELECT BLADE 6.5 EXT (BLADE) ×2 IMPLANT
ELECT REM PT RETURN 9FT ADLT (ELECTROSURGICAL) ×2
ELECTRODE REM PT RTRN 9FT ADLT (ELECTROSURGICAL) ×2 IMPLANT
GLOVE BIO SURGEON STRL SZ7 (GLOVE) ×4 IMPLANT
GOWN STRL REUS W/ TWL LRG LVL3 (GOWN DISPOSABLE) ×4 IMPLANT
GOWN STRL REUS W/TWL LRG LVL3 (GOWN DISPOSABLE) ×8
HANDLE YANKAUER SUCT BULB TIP (MISCELLANEOUS) IMPLANT
HEMOSTAT SURGICEL 2X14 (HEMOSTASIS) IMPLANT
KIT OSTOMY DRAINABLE 2.75 STR (WOUND CARE) IMPLANT
LIGASURE IMPACT 36 18CM CVD LR (INSTRUMENTS) IMPLANT
MANIFOLD NEPTUNE II (INSTRUMENTS) ×2 IMPLANT
NDL HYPO 22X1.5 SAFETY MO (MISCELLANEOUS) ×4 IMPLANT
NEEDLE HYPO 22X1.5 SAFETY MO (MISCELLANEOUS) ×4 IMPLANT
PACK BASIN MAJOR ARMC (MISCELLANEOUS) ×2 IMPLANT
RELOAD PROXIMATE 75MM BLUE (ENDOMECHANICALS) ×6 IMPLANT
RELOAD STAPLE 75 3.8 BLU REG (ENDOMECHANICALS) IMPLANT
SPONGE ABDOMINAL VAC ABTHERA (MISCELLANEOUS) IMPLANT
SPONGE T-LAP 18X18 ~~LOC~~+RFID (SPONGE) ×2 IMPLANT
SPONGE T-LAP 18X36 ~~LOC~~+RFID STR (SPONGE) IMPLANT
STAPLER PROXIMATE 75MM BLUE (STAPLE) IMPLANT
STAPLER SKIN PROX 35W (STAPLE) ×2 IMPLANT
SUT SILK 2 0 SH CR/8 (SUTURE) ×2 IMPLANT
SUT SILK 2 0SH CR/8 30 (SUTURE) ×2 IMPLANT
SUT VIC AB 3-0 SH 27 (SUTURE) ×12
SUT VIC AB 3-0 SH 27X BRD (SUTURE) ×2 IMPLANT
SYR 20ML LL LF (SYRINGE) ×4 IMPLANT
SYR 3ML LL SCALE MARK (SYRINGE) ×2 IMPLANT
TRAP FLUID SMOKE EVACUATOR (MISCELLANEOUS) ×2 IMPLANT
TUBING CONNECTING 10 (TUBING) IMPLANT
WATER STERILE IRR 1000ML POUR (IV SOLUTION) IMPLANT

## 2023-05-14 NOTE — Transfer of Care (Signed)
Immediate Anesthesia Transfer of Care Note  Patient: Debra [name]  Procedure(s) Performed: EXPLORATORY LAPAROTOMY COLOSTOMY ILEOSTOMY SMALL BOWEL RESECTION APPLICATION OF WOUND VAC EXCISION OF MESH  Patient Location: PACU and ICU  Anesthesia Type:General  Level of Consciousness: Patient remains intubated per anesthesia plan  Airway & Oxygen Therapy: Patient remains intubated per anesthesia plan  Post-op Assessment: Report given to RN and Post -op Vital signs reviewed and stable  Post vital signs: Reviewed and stable  Last Vitals:  Vitals Value Taken Time  BP 116/78   Temp    Pulse 62   Resp 17 05/14/23 1843  SpO2 96 % 05/14/23 1840  Vitals shown include unfiled device data.  Last Pain:  Vitals:   05/14/23 1200  TempSrc: Axillary  PainSc:       Patients Stated Pain Goal: 0 (05/09/23 0553)  Complications: No notable events documented.

## 2023-05-14 NOTE — Progress Notes (Signed)
PT Cancellation Note  Patient Details Name: Debra [name] MRN: [id number] DOB: 10/14/1965   Cancelled Treatment:     Pt off floor at procedure. Will continue PT as appropriate per POC   Jannet Askew 05/14/2023, 1:26 PM

## 2023-05-14 NOTE — Consult Note (Signed)
VAT: Consult for USGIV for vasopressor  This RN assessed both arms for PIV placement. Unable to find viable vein for placement. Care RN at bedside during assessment.

## 2023-05-14 NOTE — Progress Notes (Signed)
Dr Everlene Farrier to bedside. Pt to be taken to OR for emergent ex lap. Procedure explained to pt, and pt's family members at bedside. Consent obtained and type and screen orders placed. Report given to OR RN, plan to stop TPN for procedure.

## 2023-05-14 NOTE — Op Note (Addendum)
PROCEDURES: Reopening of laparotomy  Excision / explantation  of Mesh ( total) 3.   Small bowel resection x 2 including SB anastomosis 4. Creation of end Colostomy Hartmann's to the Right of the abdominal wall 5.  Double barrel proximal ileostomy 6.  Repair of colotomy cecum 7. 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 with endotracheal tube   Assistant: Dr. Maia Plan ( essential due to the complexity of the procedure and to have adequate exposure) Instrumental for this complex case that lasted 12 hrs    Findings: Unincorporated Phasix mesh anteriorly Partially incorporated intraperitoneal phasix ST mesh Anastomotic leak at both proximal and distal end with almost a complete disruption of anastomosis Extremely difficult case with hostile abdomen and fecal peritonitis Enterotomies x  three enterotomies proximal to the anastomosis within the proximal ileum Cecal colotomy Two colotomy next to prior sigmoid anastomosis  Frozen abdomen in the pelvis, unable to fully mobilize the small bowel due to severe inflammatory response Loss of domain , unable to close abdominal cavity with ventral defect of about 30 cm in diameter   Estimated Blood Loss: 500 cc                Complications: none        Condition: critical   Procedure Details  The patient was seen again in the Holding Room. The benefits, complications, treatment options, and expected outcomes were discussed with the patient. The risks of bleeding, infection, recurrence of symptoms, failure to resolve symptoms,  bowel injury, any of which could require further surgery were reviewed with the patient.   The patient was taken to Operating Room, identified and the procedure verified.  A  Time Out was held and the above information confirmed. Anesthesia needed appropriate IV serous as well as an arterial line.  She remained on Levophed throughout the case I was able to remove the [name] pouch and while doing that also realized that the most superficial phasix mesh was unincorporated was able to remove it without any scissors.  At this point I was able to prep and drape the abdomen using Betadine solution. She was prepped and draped in the usual sterile fashion.  Exploration revealed evidence of fecal peritonitis.  I also noticed that the intraperitoneal mesh was only partially incorporated.  He was only incorporating the superior portion.  I was able to remove it very carefully without creating any injuries.  I did not notice any tackers that were causing any bowel injuries or any stitches that were causing any bowel injuries.  Thorough inspection revealed an obvious evidence of anastomotic leak both at the proximal and distal ends with almost complete disruption of the anastomosis.  Was able to suction the succus and irrigated abdominal cavity with sterile water.  I continued my exploration and I did find 3 additional enterotomies proximal to the anastomotic leak.  1 was related to a prior enterotomy repair and the other ones were de novo.  He also found that there were 2 colotomies at the prior sigmoid anastomosis from 3 years ago.  We also found a colotomy at the cecum. I was extremely careful and was able to finger dissect the bowel.  This case was very  challenging due to the inflammatory response and also saw some tattooing of the mesh within the bowel.  I do think in retrospect she developed an anastomotic leak that perpetrated because inflammation and ultimately also the mesh created significant inflammatory response since it was leans against the bowel.  I did notice that the nonadhesive coating of the mesh was completely gone.  There was multiple areas of the bowel that were spacious for  some erosion of the mesh. I completed my lysis of adhesion to try to restore the anatomy.  Please note that the pelvis was completely frozen and I was unable to fully mobilize the small bowel.  I resected the small bowel anastomosis with a GIA 75 mm stapler and mesentery divided with ligasure. There was an additional piece of bowel that I also removed that was also needed to prevent blow out of the end of the small bowel. This was done with 75 mm GIA stapler.   To my best of my abilities I was able to mobilize the segment of ileum that had the 3 enterotomies.  Initially I was thinking about loop ileostomy but there was no evidence of mesentery length to do this.  Therefore I had to divide the small bowel proximal to the enterotomies and created at the double barrel ileostomy.  Is not that I did not attempt to repair any of the enterotomies within the small bowel as the bowel was friable and I was sure that they were going to open back up again.  Given that I was going to give at diversion approximately I decided to perform a repair of the cecal colotomy.  This was done with multiple interrupted silk sutures.  I then evaluated the 2 colotomies at the sigmoid portion and there was significant disruption that I did not feel safe that another repair will be able to address this.  Moreover needed to vent the colon in some fashion.  This point at the also decided to perform an end colostomy and I resected the sigmoid colon where the colotomy is aware. Then I was able to create defect on the skin and subcutaneous tissue of both the right and the left anterior abdominal wall.  For the right defect the end colostomy was brought out and for the left defect the double barrel ileostomy's were brought out.  Please note that because of the retraction of the abdominal wall laterally I was unable to exteriorize the ostomies via any abdominal wall musculature. I was able to matured both the end colostomy as well as a double  barrel ileostomy with multiple interrupted 3-0 Vicryl's.  Again the abdominal cavity was profusely irrigated with normal saline.  An ABThera wound VAC was placed without evidence of any leaks.  I Did not attempt to cover or bridged abdominal wall due to the fragility of the abdominal wall and my fear of further developing fistulous or enterotomies due to her poor healing condition.  Needle and laparotomy count were correct and there were  Pt was transferred to the ICU intubated in critical condition/. no immediate complications.  She is in critical condition.  Family was updated in detail about her condition. I am not sure if I could have done anything different.  This was an extremely complicated case without good answers Dr. Maia Plan was essential due to the complexity of the procedure and to have adequate exposure, assisted with bowel resection creation of ostomies, he was Instrumental for this complex case that.

## 2023-05-14 NOTE — Progress Notes (Signed)
OT Cancellation Note  Patient Details Name: Debra [name] MRN: [id number] DOB: 14-Nov-1965   Cancelled Treatment:    Reason Eval/Treat Not Completed: Other (comment) (Pt is off the floor for procedure, will need new orders if appropriate following procedure)  Oleta Mouse, OTD OTR/L  05/14/23, 1:11 PM

## 2023-05-14 NOTE — Progress Notes (Signed)
PROGRESS NOTE    Debra [name]  WUX:[id number] DOB: 1965-12-02 DOA: 02/13/2022 PCP: Hillery Aldo, MD    Brief Narrative:  Debra [name] is a 57 y.o. 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 with recurrent disease at anterior pelvis next to the anastomosis which was complicated by high output enterocutaneous fistula , and laparotomy excision of greater omental mass, abdominal wall reconstruction with [name], appendectomy and placement of Prevena vacuum on 06/08, complicated with small bowel perforation, underwent reopening of laparotomy and repair of small bowel perforation, high output enterocutaneous fistula, on chronic TPN since April this year, has had a prolonged hospital stay, complicated with right IJ Port-A-Cath Enterococcus infection which was removed and a new PICC line was placed recently.  Hospitalist team was asked to manage patient's hyperglycemia and coagulopathy.     Significant hospital events:  02/13/22: initial laparotomy, excision of greater omental mass, abdominal wall reconstruction with [name], appendectomy, and placement of Prevena vac  02/15/22: EXPLORATORY LAPAROTOMY REPAIR OF BOWEL PERFORATION, INSERTION OF MESH, APPLICATION OF WOUND VAC 02/26/22:  COLON RESECTION SIGMOID, HERNIA REPAIR VENTRAL, APPENDECTOMY, INSERTION OF MESH, REPAIR ABDOMINAL WALL 07/15/22: Neuro surgery consultation due to L1 compression fracture. Not a candidate for lumbosacral orthotic. 12/19/22: Insertion of Port-a-cath 04/06/23: removal of port-a-cath Throughout stay patient has required TPN, WOC & ID consultation.     Recent hospital events  04/28/23: 12 hours and surgery for abdominal wall reconstruction, recurrent incisional hernia repair, partial gastrectomy, small bowel resection, repair enterotomy x 3 and wound VAC placement.  Patient extubated postop but too lethargic to maintain airway  requiring reintubation and transferred to ICU for postoperative mechanical ventilatory management. 04/29/23: Pt remains mechanically intubated FiO2 40%/PEEP 5.  Pt awake and with purposeful movement on precedex gtt.  Will perform SBT with plans for possible extubation  04/30/23: hospitalist consulted for hyperglycemia and coagulopathy. Overnight fever to 101.8, tachycardic, WBC WNL but concern for SIRS/sepsis 08/23: pro calcitonin (+), starting tx for HCAP. MRSA resp screen recently neg 08/20, will get sputum cx. Echo EF 60-65%, G1DD 08/24: hypernatremic, inflammatory markers up (ferritin high, ALP high), still septic/SIRS, escalating abx, repeat CXR, very cautious fluid administration to help reduce sodium and if increased SOB would d/c fluids and repeat CXR, increased insulin, repeat albumin  08/25: Tachycardia has improved, still tachypnea but no longer meeting sepsis/sirs criteria. Sodium about same 152, Hgb stable. Relatively low UOsm 444 c/w osmotic diuresis (get Glc controlled) / loop diuretics (d/c Lasix) / ADH resistance (correct potassium though hypokalemia is not severe, protein malnutrition is most likely greater factor). Gentle hydration and will need to d/w dietary/pharmacy but malnutrition is going to be a persistent issue. Still substantial fluid loss via NG 08/26: overnight, increased confusion, temp 100.9-101.1, tachycardic, wound vac drainage increased, transferred back to stepdown, switch cefepime + flagyl to Zosyn, continue vanc, follow BCx, neuro-checks, given lasix, CT head no acute process, CT abd/pel no abscess abdominal and multifocal lung infiltrate, CTA chest to r/o PE / eval pneumonia/effusion --> no PE, (+)pulmonary edema. WIll hold fluids and try diuresis tonight, hopefully sodium and BP will hold, signed otu to night team in ICU in case needing pressors 08/27: stable this morning, sodium slightly better, HH worse, will monitor closely, n oUOP documentd, follow  closely .  9/4:  Sodium improved, remains on IV Unasyn 9/5: Patient now on vasopressor support.  Levophed at 25 mcg.  Suspect secondary to hypovolemia in  the setting of very high output fistula.  Discussed with general surgery.  Likely plan to return to operating room today.  Patient will be high risk.   Assessment & Plan:   Principal Problem:   Enterocutaneous fistula Active Problems:   S/P hernia repair   Closed wedge compression fracture of first lumbar vertebra (HCC)   Recurrent ventral hernia   Arm swelling   Acute respiratory failure with hypoxia (HCC)   Acute metabolic encephalopathy   HCAP (healthcare-associated pneumonia)   Acute pulmonary edema (HCC)   Anasarca/Edema generalized  Hypoalbuminemia Lasix held because of AKI and hypernatremia Continue to hold Lasix for now while on D5 Albumin 12.5 g of 25% every 6 hours 2D echo on 04/30/2023 showed EF estimated 60-65%, grade 1 diastolic dysfunction  Hypovolemic shock Patient had very high output operative fistula on 9/4.  Nearly 2 L.  Was started on Levophed.  Currently 25 mcg.  No signs of sepsis.  Suspect this is all related to volume status.  Albumin as above.  Wean pressors as tolerated.     HCAP Meeting sepsis criteria overnight 08/22-08/24 w/ tachycardia and fever (+)procalcitonin 7 days of antibiotics is adequate for treatment of pneumonia.  Last dose should be 9/5     Hypernatremia Slowly improving (down from 150-148).   Continue IV 5% dextrose infusion and monitor sodium levels. Sodium concentration in TPN has been decreased.       Hypokalemia Improved     Acute hypoxic respiratory failure  Continue 4 L/min oxygen and taper off oxygen as able.      AKI  Improved.  Avoid nephrotoxins.     Acute metabolic encephalopathy Continue supportive care     Prediabetes with hyperglycemia Most recent A1c 6.2 indicating a condition of prediabetes at baseline Hyperglycemia likely exacerbated by TPN Continue insulin  glargine and NovoLog     Coagulopathy Mild, chronic, review patient's record showed she has a chronic elevation of INR, at baseline 1.2-1.4 and per patient granddaughter, patient has no known history of easy bleeding, no SLE history in the family.  CT abdomen showed no signs of liver cirrhosis or NASH. Getting vitamin K w/ TPN.  INR down to 1.3 on 05/11/2023     Chronic iron deficiency anemia D/t malabsorption. Recent Iron WNL, TIBC low, ferritin high d/t inflammation H&H is stable     Elevated liver enzymes/mild hyperbilirubinemia This may be due to TPN.  Continue to monitor, No acute liver abnormality noted on CT abdomen.   Thrombocytopenia It appears patient has chronic thrombocytopenia.  Monitor platelet count     s/p exploratory laparotomy, extensive lysis of adhesion, 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  She is on IV Unasyn. Follow-up with general surgeon   DVT prophylaxis: SQ heparin Code Status: Full Family Communication: None Disposition Plan: Status is: Inpatient Remains inpatient appropriate because: Multiple acute issues as above   Level of care: ICU  Consultants:  Hospitalist  Procedures:  Multiple during hospitalization  Antimicrobials: Unasyn   Subjective: Seen and examined.  Moaning in pain.  Ill-appearing  Objective: Vitals:   05/14/23 1030 05/14/23 1045 05/14/23 1100 05/14/23 1115  BP: (!) 123/53 (!) 124/44 (!) 111/56 (!) 70/59  Pulse:      Resp:      Temp:      TempSrc:      SpO2:      Weight:      Height:  Intake/Output Summary (Last 24 hours) at 05/14/2023 1154 Last data filed at 05/14/2023 1121 Gross per 24 hour  Intake 7499.08 ml  Output 5652 ml  Net 1847.08 ml   Filed Weights   05/11/23 0336 05/12/23 0500 05/13/23 0416  Weight: 108.8 kg 106.4 kg 104.1 kg    Examination:  General exam:  Ill-appearing. Respiratory system: Scattered crackles laterally.  Normal work of breathing.  4 L Cardiovascular system: Tachycardic, regular rhythm, no murmurs Gastrointestinal system: Soft, mild TTP, nondistended Central nervous system: Alert.  Oriented x 2 Extremities: Symmetric 5 x 5 power. Skin: No rashes, lesions or ulcers Psychiatry: Judgement and insight appear impaired. Mood & affect flattened.     Data Reviewed: I have personally reviewed following labs and imaging studies  CBC: Recent Labs  Lab 05/09/23 0754 05/11/23 0639 05/13/23 0504 05/14/23 0644  WBC 4.7 3.8* 4.1 12.0*  HGB 9.5* 9.1* 8.0* 9.1*  HCT 30.6* 29.0* 24.5* 28.9*  MCV 96.2 96.7 93.5 95.7  PLT 56* 46* 39* 53*   Basic Metabolic Panel: Recent Labs  Lab 05/09/23 0840 05/10/23 0359 05/10/23 1446 05/11/23 0639 05/12/23 0643 05/13/23 0504 05/14/23 0644  NA 155* 157* 143 150* 148* 141 134*  K 4.3 4.3  --  4.6 3.7 3.5 4.1  CL 120* 122*  --  117* 113* 106 99  CO2 27 26  --  26 28 27  21*  GLUCOSE 204* 194*  --  145* 196* 259* 291*  BUN 54* 41*  --  46* 45* 48* 43*  CREATININE 0.82 0.62  --  0.90 0.99 1.08* 0.87  CALCIUM 8.1* 8.1*  --  7.8* 7.7* 7.5* 7.1*  MG 2.8*  --   --  2.2  --   --   --   PHOS 2.3*  --   --  3.6  --   --   --    GFR: Estimated Creatinine Clearance: 74.3 mL/min (by C-G formula based on SCr of 0.87 mg/dL). Liver Function Tests: Recent Labs  Lab 05/09/23 0840 05/11/23 0639 05/13/23 0504  AST  --  91* 66*  ALT  --  98* 79*  ALKPHOS  --  153* 124  BILITOT  --  4.2* 4.9*  PROT  --  5.1* 5.0*  ALBUMIN 1.8* 1.7* <1.5*   No results for input(s): "LIPASE", "AMYLASE" in the last 168 hours. No results for input(s): "AMMONIA" in the last 168 hours. Coagulation Profile: Recent Labs  Lab 05/11/23 0639  INR 1.3*   Cardiac Enzymes: No results for input(s): "CKTOTAL", "CKMB", "CKMBINDEX", "TROPONINI" in the last 168 hours. BNP (last 3 results) No results for input(s): "PROBNP" in  the last 8760 hours. HbA1C: No results for input(s): "HGBA1C" in the last 72 hours. CBG: Recent Labs  Lab 05/13/23 2020 05/13/23 2344 05/14/23 0328 05/14/23 0721 05/14/23 1119  GLUCAP 107* 235* 245* 260* 238*   Lipid Profile: No results for input(s): "CHOL", "HDL", "LDLCALC", "TRIG", "CHOLHDL", "LDLDIRECT" in the last 72 hours. Thyroid Function Tests: No results for input(s): "TSH", "T4TOTAL", "FREET4", "T3FREE", "THYROIDAB" in the last 72 hours. Anemia Panel: No results for input(s): "VITAMINB12", "FOLATE", "FERRITIN", "TIBC", "IRON", "RETICCTPCT" in the last 72 hours. Sepsis Labs: No results for input(s): "PROCALCITON", "LATICACIDVEN" in the last 168 hours.  No results found for this or any previous visit (from the past 240 hour(s)).        Radiology Studies: CT ABDOMEN PELVIS W CONTRAST  Result Date: 05/13/2023 CLINICAL DATA:  Postoperative abdominal pain. EXAM: CT ABDOMEN AND PELVIS  WITH CONTRAST TECHNIQUE: Multidetector CT imaging of the abdomen and pelvis was performed using the standard protocol following bolus administration of intravenous contrast. RADIATION DOSE REDUCTION: This exam was performed according to the departmental dose-optimization program which includes automated exposure control, adjustment of the mA and/or kV according to patient size and/or use of iterative reconstruction technique. CONTRAST:  OMNIPAQUE IOHEXOL 300 MG/ML  SOLN COMPARISON:  May 06, 2023. FINDINGS: Lower chest: Small bilateral pleural effusions are noted with adjacent subsegmental atelectasis. Hepatobiliary: No focal liver abnormality is seen. No gallstones, gallbladder wall thickening, or biliary dilatation. Pancreas: Unremarkable. No pancreatic ductal dilatation or surrounding inflammatory changes. Spleen: Normal in size without focal abnormality. Adrenals/Urinary Tract: Adrenal glands and kidneys appear normal. No hydronephrosis or renal obstruction is noted. Urinary bladder is  decompressed secondary to Foley catheter. Stomach/Bowel: Nasogastric tube tip is seen in distal stomach. No evidence of bowel obstruction. Patient appears to be status post large and small bowel resection. Large midline surgical wound remains. At least 2 surgical drains remain anteriorly within the pelvis. There does appear to be high density material within the wound inferiorly suggesting enterocutaneous fistula, potentially present in the left lower quadrant. Vascular/Lymphatic: No significant vascular findings are present. No enlarged abdominal or pelvic lymph nodes. Reproductive: Uterus and bilateral adnexa are unremarkable. Other: No definite ascites or abscess is noted. Musculoskeletal: Old L1 compression fracture is noted. No acute osseous abnormality is noted. IMPRESSION: Small bilateral pleural effusions with adjacent subsegmental atelectasis. Status post both large and small bowel resection. Large midline surgical wound remains. Does appear to be high density material within the wound in its inferior portion suggesting enterocutaneous fistula, but it is difficult to determine exactly where the fistula is, but potentially may be in the left lower quadrant. Electronically Signed   By: Lupita Raider M.D.   On: 05/13/2023 14:37        Scheduled Meds:  sodium chloride   Intravenous Once   sodium chloride   Intravenous Once   alteplase  2 mg Intracatheter Once   Chlorhexidine Gluconate Cloth  6 each Topical Daily   heparin injection (subcutaneous)  5,000 Units Subcutaneous Q8H   insulin aspart  0-20 Units Subcutaneous Q4H   insulin aspart  4 Units Subcutaneous Q4H   insulin glargine-yfgn  22 Units Subcutaneous Daily   levothyroxine  137 mcg Per Tube Q0600   lidocaine  2 patch Transdermal Q24H   loperamide HCl  4 mg Per Tube TID   octreotide  200 mcg Subcutaneous Q8H   pantoprazole (PROTONIX) IV  40 mg Intravenous Q12H   pregabalin  200 mg Per Tube TID   sodium chloride flush  10-40 mL  Intracatheter Q12H   Continuous Infusions:  sodium chloride 75 mL/hr at 05/14/23 1121   albumin human Stopped (05/14/23 0917)   ampicillin-sulbactam (UNASYN) IV Stopped (05/14/23 0534)   fluconazole (DIFLUCAN) IV 200 mg (05/14/23 1135)   methocarbamol (ROBAXIN) IV     norepinephrine (LEVOPHED) Adult infusion 28 mcg/min (05/14/23 1121)   TPN ADULT (ION) 65 mL/hr at 05/14/23 1121   TPN ADULT (ION)       LOS: 455 days     Tresa Moore, MD [facility]   If 7PM-7AM, please contact night-coverage  05/14/2023, 11:54 AM

## 2023-05-14 NOTE — OR Nursing (Signed)
Patient Debra [name] was scheduled for an exploratory laparotomy by Dr. Everlene Farrier.  She was not coherent enough to give consent to the surgery. Her daughter, George Hugh, was contacted by phone and gave consent to he mother having the surgery. Dr. Everlene Farrier and Dr. Lorette Ang both spoke to the daughter on the phone about her mother needing surgery and gave her consent.

## 2023-05-14 NOTE — Anesthesia Preprocedure Evaluation (Addendum)
Anesthesia Evaluation  Patient identified by MRN, date of birth, ID band Patient awake    Reviewed: Allergy & Precautions, H&P , NPO status , Patient's Chart, lab work & pertinent test results  Airway Mallampati: IV  TM Distance: >3 FB Neck ROM: full    Dental  (+) Poor Dentition, Missing, Chipped, Dental Advidsory Given   Pulmonary neg pulmonary ROS   Pulmonary exam normal        Cardiovascular Exercise Tolerance: Poor Normal cardiovascular exam  Lower extremity edema treated with lasix   Neuro/Psych negative neurological ROS  negative psych ROS   GI/Hepatic Neg liver ROS,,,Enterocutaneous Fistula TPN requirement   Endo/Other  Hypothyroidism  Morbid obesity  Renal/GU      Musculoskeletal   Abdominal  (+) + obese  Peds  Hematology  (+) Blood dyscrasia, anemia Thrombocytopenia Elevated INR 1.5   Anesthesia Other Findings Patient in critical state in the ICU with a MAP of 65 on 28 mcg of levophed/min through a PICC line. Patient's platelet count is 53k. Ordered stat type and screen with 1u of plt to be given in OR and 2u of PRBC to be in the OR with a hgb of 9.3.    Past Medical History: No date: Anemia 04/2020: Colon cancer (HCC) No date: History of kidney stones No date: Hypothyroidism No date: Thyroid disease  Past Surgical History: 02/13/2022: ABDOMINAL WALL DEFECT REPAIR; N/A     Comment:  Procedure: REPAIR ABDOMINAL WALL;  Surgeon: Leafy Ro, MD;  Location: [facility];  Service: General;                Laterality: N/A; 02/13/2022: APPENDECTOMY; N/A     Comment:  Procedure: APPENDECTOMY;  Surgeon: Leafy Ro, MD;                Location: [facility];  Service: General;  Laterality: N/A; 02/15/2022: APPLICATION OF WOUND VAC; N/A     Comment:  Procedure: APPLICATION OF WOUND VAC;  Surgeon: Leafy Ro, MD;  Location: [facility];  Service: General;                Laterality:  N/A;  [id number] 02/13/2022: COLON RESECTION SIGMOID; N/A     Comment:  Procedure: COLON RESECTION SIGMOID, open, [name], PA-C to assist;  Surgeon: Leafy Ro, MD;                Location: [facility];  Service: General;  Laterality: N/A; 11/21/2019: COLONOSCOPY WITH PROPOFOL; N/A     Comment:  Procedure: COLONOSCOPY WITH PROPOFOL;  Surgeon: Toney Reil, MD;  Location: [facility];  Service:               Gastroenterology;  Laterality: N/A; 07/10/2021: COLONOSCOPY WITH PROPOFOL; N/A     Comment:  Procedure: COLONOSCOPY WITH PROPOFOL;  Surgeon: Toney Reil, MD;  Location: [facility];  Service:               Gastroenterology;  Laterality: N/A; 02/06/2022: COLONOSCOPY WITH PROPOFOL; N/A     Comment:  Procedure: COLONOSCOPY WITH PROPOFOL;  Surgeon: Allegra Lai,  Loel Dubonnet, MD;  Location: [facility];  Service:               Gastroenterology;  Laterality: N/A;  SPANISH 01/30/2020: CYSTOSCOPY WITH STENT PLACEMENT; Left     Comment:  Procedure: CYSTOSCOPY WITH STENT PLACEMENT;  Surgeon:               Vanna Scotland, MD;  Location: [facility];  Service:               Urology;  Laterality: Left; 11/21/2019: ESOPHAGOGASTRODUODENOSCOPY (EGD) WITH PROPOFOL; N/A     Comment:  Procedure: ESOPHAGOGASTRODUODENOSCOPY (EGD) WITH               PROPOFOL;  Surgeon: Toney Reil, MD;  Location:               [facility];  Service: Gastroenterology;  Laterality:               N/A; 02/13/2022: INSERTION OF MESH; N/A     Comment:  Procedure: INSERTION OF MESH;  Surgeon: Leafy Ro,               MD;  Location: [facility];  Service: General;  Laterality:               N/A; 02/15/2022: INSERTION OF MESH; N/A     Comment:  Procedure: INSERTION OF MESH;  Surgeon: Leafy Ro,               MD;  Location: [facility];  Service: General;  Laterality:               N/A; 07/03/2020: IR RADIOLOGIST EVAL & MGMT 07/12/2020: IR  RADIOLOGIST EVAL & MGMT 02/15/2022: LAPAROTOMY; N/A     Comment:  Procedure: EXPLORATORY LAPAROTOMY REPAIR OF BOWEL               PERFORATION;  Surgeon: Leafy Ro, MD;  Location:               [facility];  Service: General;  Laterality: N/A; 01/30/2020: PARTIAL COLECTOMY; N/A     Comment:  Procedure: PARTIAL COLECTOMY Sigmoid;  Surgeon: Duanne Guess, MD;  Location: [facility];  Service: General;                Laterality: N/A; 03/28/2020: PORTACATH PLACEMENT; Left     Comment:  Procedure: INSERTION PORT-A-CATH;  Surgeon: Duanne Guess, MD;  Location: [facility];  Service: General;                Laterality: Left; 08/29/2020: PORTACATH PLACEMENT; N/A     Comment:  Procedure: INSERTION PORT-A-CATH, chemotherapy;                Surgeon: Duanne Guess, MD;  Location: [facility];                Service: General;  Laterality: N/A; 12/19/2022: PORTACATH PLACEMENT; N/A     Comment:  Procedure: INSERTION PORT-A-CATH;  Surgeon: Leafy Ro, MD;  Location: [facility];  Service: General;                Laterality: N/A; 02/13/2022: VENTRAL HERNIA REPAIR; N/A     Comment:  Procedure: HERNIA REPAIR VENTRAL ADULT, open, Earna Coder  Manus Rudd, PA-C to assist;  Surgeon: Leafy Ro, MD;                Location: [facility];  Service: General;  Laterality: N/A;  BMI    Body Mass Index: 48.09 kg/m      Reproductive/Obstetrics negative OB ROS                             Anesthesia Physical Anesthesia Plan  ASA: 4 and emergent  Anesthesia Plan: General ETT and General   Post-op Pain Management: Ofirmev IV (intra-op)*, Ketamine IV* and Regional block*   Induction: Intravenous  PONV Risk Score and Plan: 2 and Ondansetron, Dexamethasone and Treatment may vary due to age or medical condition  Airway Management Planned: Oral ETT  Additional Equipment: Arterial line  Intra-op Plan:   Post-operative Plan: Post-operative  intubation/ventilation  Informed Consent: I have reviewed the patients History and Physical, chart, labs and discussed the procedure including the risks, benefits and alternatives for the proposed anesthesia with the patient or authorized representative who has indicated his/her understanding and acceptance.     Consent reviewed with POA and Dental Advisory Given  Plan Discussed with: Anesthesiologist, CRNA and Surgeon  Anesthesia Plan Comments: (Patient's daughter consented for risks of anesthesia including but not limited to:  - adverse reactions to medications - damage to eyes, teeth, lips or other oral mucosa - nerve damage due to positioning  - sore throat or hoarseness - Damage to heart, brain, nerves, lungs, other parts of body or loss of life  Discussed the very emergent and very critical state of health that the patient was in with her daughter. Discussed Patient's daughter voiced understanding and agreed to proceed.)        Anesthesia Quick Evaluation

## 2023-05-14 NOTE — Consult Note (Signed)
over 60 Minutes Intravenous Every 24 hours 05/02/23 1530 05/03/23 0823   05/03/23 1800  vancomycin (VANCOREADY) IVPB 1250 mg/250 mL  Status:  Discontinued        1,250 mg 166.7 mL/hr over 90 Minutes Intravenous Every 24 hours 05/03/23 0823 05/03/23 1752    05/03/23 1800  Vancomycin (VANCOCIN) 1,250 mg in sodium chloride 0.9 % 250 mL IVPB  Status:  Discontinued        1,250 mg 166.7 mL/hr over 90 Minutes Intravenous Every 24 hours 05/03/23 1752 05/04/23 0755   05/02/23 2000  ceFEPIme (MAXIPIME) 2 g in sodium chloride 0.9 % 100 mL IVPB  Status:  Discontinued        2 g 200 mL/hr over 30 Minutes Intravenous Every 8 hours 05/02/23 1530 05/04/23 0236   05/02/23 1630  vancomycin (VANCOREADY) IVPB 2000 mg/400 mL        2,000 mg 200 mL/hr over 120 Minutes Intravenous  Once 05/02/23 1530 05/02/23 1746   05/02/23 1600  metroNIDAZOLE (FLAGYL) IVPB 500 mg  Status:  Discontinued        500 mg 100 mL/hr over 60 Minutes Intravenous Every 12 hours 05/02/23 1453 05/04/23 0236   05/01/23 1400  azithromycin (ZITHROMAX) 500 mg in sodium chloride 0.9 % 250 mL IVPB  Status:  Discontinued        500 mg 250 mL/hr over 60 Minutes Intravenous Every 24 hours 05/01/23 1255 05/02/23 1453   05/01/23 1230  azithromycin (ZITHROMAX) tablet 500 mg  Status:  Discontinued        500 mg Oral Daily 05/01/23 1135 05/01/23 1255   05/01/23 1200  cefTRIAXone (ROCEPHIN) 2 g in sodium chloride 0.9 % 100 mL IVPB  Status:  Discontinued        2 g 200 mL/hr over 30 Minutes Intravenous Every 24 hours 05/01/23 1135 05/02/23 1453   04/27/2023 2200  cefoTEtan (CEFOTAN) 2 g in sodium chloride 0.9 % 100 mL IVPB        2 g 200 mL/hr over 30 Minutes Intravenous Every 8 hours 04/17/2023 2058 04/29/23 1717   04/27/23 0945  cefoTEtan (CEFOTAN) 2 g in sodium chloride 0.9 % 100 mL IVPB        2 g 200 mL/hr over 30 Minutes Intravenous On call to O.R. 04/27/23 0856 05/05/2023 0559   04/05/23 0900  cefTRIAXone (ROCEPHIN) 2 g in sodium chloride 0.9 % 100 mL IVPB        2 g 200 mL/hr over 30 Minutes Intravenous Every 24 hours 04/05/23 0634 04/14/23 1254   12/31/2022 1000  ceFAZolin (ANCEF) IVPB 2g/100 mL premix        2 g 200 mL/hr over 30 Minutes Intravenous  Once 12/18/22 1738 01/05/2023 1141   09/17/22 1200   vancomycin (VANCOREADY) IVPB 1250 mg/250 mL  Status:  Discontinued        1,250 mg 166.7 mL/hr over 90 Minutes Intravenous Every 24 hours 09/16/22 1040 09/22/22 1139   09/16/22 1200  vancomycin (VANCOREADY) IVPB 1250 mg/250 mL        1,250 mg 166.7 mL/hr over 90 Minutes Intravenous  Once 09/15/22 1706 09/16/22 1255   09/15/22 1200  ceFEPIme (MAXIPIME) 2 g in sodium chloride 0.9 % 100 mL IVPB  Status:  Discontinued        2 g 200 mL/hr over 30 Minutes Intravenous Every 8 hours 09/15/22 1024 09/18/22 1026   09/15/22 1200  vancomycin (VANCOREADY) IVPB 2000 mg/400 mL        2,000  mg 200 mL/hr over 120 Minutes Intravenous  Once 09/15/22 1024 09/15/22 1500   03/04/22 2200  piperacillin-tazobactam (ZOSYN) IVPB 3.375 g        3.375 g 12.5 mL/hr over 240 Minutes Intravenous Every 8 hours 03/04/22 2005 03/18/22 0248   02/21/22 1500  vancomycin (VANCOREADY) IVPB 1250 mg/250 mL  Status:  Discontinued        1,250 mg 166.7 mL/hr over 90 Minutes Intravenous Every 24 hours 02/21/22 1111 02/24/22 1336   02/21/22 1400  meropenem (MERREM) 1 g in sodium chloride 0.9 % 100 mL IVPB        1 g 200 mL/hr over 30 Minutes Intravenous Every 8 hours 02/21/22 1258 02/27/22 2157   02/20/22 1500  vancomycin (VANCOCIN) IVPB 1000 mg/200 mL premix  Status:  Discontinued        1,000 mg 200 mL/hr over 60 Minutes Intravenous Every 24 hours 02/19/22 1706 02/21/22 1111   02/19/22 1115  vancomycin (VANCOREADY) IVPB 2000 mg/400 mL        2,000 mg 200 mL/hr over 120 Minutes Intravenous  Once 02/19/22 1025 02/19/22 1732   02/19/22 1115  fluconazole (DIFLUCAN) IVPB 400 mg        400 mg 100 mL/hr over 120 Minutes Intravenous Every 24 hours 02/19/22 1025 03/04/22 1301   02/14/2022 0730  piperacillin-tazobactam (ZOSYN) IVPB 3.375 g  Status:  Discontinued        3.375 g 12.5 mL/hr over 240 Minutes Intravenous Every 8 hours 02/28/2022 0639 02/21/22 1235   02/27/2022 0655  piperacillin-tazobactam (ZOSYN) 3.375 GM/50ML IVPB       Note  to Pharmacy: Loretha Stapler N: cabinet override      03/05/2022 0655 03/07/2022 0816   03/05/2022 2200  cefoTEtan (CEFOTAN) 2 g in sodium chloride 0.9 % 100 mL IVPB  Status:  Discontinued        2 g 200 mL/hr over 30 Minutes Intravenous Every 12 hours 02/07/2022 1541 02/18/2022 0639   02/06/2022 1345  cefoTEtan (CEFOTAN) 2 g in sodium chloride 0.9 % 100 mL IVPB        2 g 200 mL/hr over 30 Minutes Intravenous  Once 02/18/2022 1331 02/28/2022 2146   03/04/2022 1336  sodium chloride 0.9 % with cefoTEtan (CEFOTAN) ADS Med       Note to Pharmacy: Gates Rigg: cabinet override      02/06/2022 1336 02/14/22 0144   02/18/2022 0620  sodium chloride 0.9 % with cefoTEtan (CEFOTAN) ADS Med       Note to Pharmacy: Agnes Lawrence A: cabinet override      02/27/2022 0620 02/21/2022 0802   02/09/2022 0600  cefoTEtan (CEFOTAN) 2 g in sodium chloride 0.9 % 100 mL IVPB        2 g 200 mL/hr over 30 Minutes Intravenous On call to O.R. 02/12/22 2229 02/07/2022 2146      Significant Hospital Events: Including procedures, antibiotic start and stop dates in addition to other pertinent events   02/06/2022: initial laparotomy, excision of greater omental mass, abdominal wall reconstruction with [name], appendectomy, and placement of Prevena vac  02/26/2022: EXPLORATORY LAPAROTOMY REPAIR OF BOWEL PERFORATION, INSERTION OF MESH, APPLICATION OF WOUND VAC 02/26/22:  COLON RESECTION SIGMOID, HERNIA REPAIR VENTRAL, APPENDECTOMY, INSERTION OF MESH, REPAIR ABDOMINAL WALL 07/15/22: Neuro surgery consultation due to L1 compression fracture. Not a candidate for lumbosacral orthotic. 12/29/2022: Insertion of Port-a-cath 04/06/23: removal of port-a-cath Throughout stay patient has required TPN, WOC & ID consultation. 04/19/2023: 12 hours and surgery for  SCr of 0.87 mg/dL). Recent Labs  Lab 05/09/23 0754 05/11/23 0639 05/13/23 0504 05/13/2023 0644  WBC 4.7 3.8* 4.1 12.0*    Liver Function Tests: Recent Labs  Lab 05/09/23 0840 05/11/23 0639 05/13/23 0504  AST  --  91* 66*  ALT  --  98* 79*  ALKPHOS  --  153* 124  BILITOT  --  4.2* 4.9*  PROT  --  5.1* 5.0*  ALBUMIN 1.8* 1.7* <1.5*   No results for input(s): "LIPASE", "AMYLASE" in the last 168 hours. No results for input(s): "AMMONIA" in the last 168 hours.  ABG    Component Value Date/Time   PHART 7.31 (L) 04/29/2023 0005   PCO2ART 44 04/29/2023 0005   PO2ART 95 04/29/2023 0005   HCO3 25.9 05/04/2023 0430   ACIDBASEDEF 0.4 05/04/2023 0430   O2SAT 83.6 05/04/2023 0430     Coagulation Profile: Recent Labs  Lab 05/11/23 0639  INR 1.3*    Cardiac Enzymes: No results for input(s): "CKTOTAL", "CKMB", "CKMBINDEX", "TROPONINI" in the last 168 hours.  HbA1C: Hgb A1c MFr Bld  Date/Time Value Ref Range Status  04/02/2023 08:56 AM 6.8 (H) 4.8 - 5.6 % Final    Comment:    (NOTE) Pre diabetes:          5.7%-6.4%  Diabetes:              >6.4%  Glycemic control for   <7.0% adults with diabetes      CBG: Recent Labs  Lab 05/13/23 2020 05/13/23 2344 05/27/2023 0328 05/30/2023 0721 05/26/2023 1119  GLUCAP 107* 235* 245* 260* 238*    Review of Systems:   UTA patient intubated and sedated unable to participate in interview at this time  Past Medical History:  She,  has a past medical history of Anemia, Colon cancer (HCC) (04/2020), History of kidney stones, Hypothyroidism, and Thyroid disease.   Surgical History:   Past Surgical History:  Procedure Laterality Date   ABDOMINAL WALL DEFECT REPAIR N/A 02/28/2022   Procedure: REPAIR ABDOMINAL WALL;  Surgeon: Leafy Ro, MD;  Location: [facility];  Service: General;  Laterality: N/A;   ABDOMINAL WALL DEFECT REPAIR N/A 04/12/2023   Procedure: Reconstruction of ABDOMINAL WALL;  Surgeon: Leafy Ro, MD;  Location: [facility];  Service: General;  Laterality: N/A;   APPENDECTOMY N/A 03/01/2022   Procedure: APPENDECTOMY;  Surgeon: Leafy Ro, MD;  Location: [facility];  Service: General;  Laterality: N/A;   APPLICATION OF WOUND VAC N/A 02/25/2022   Procedure: APPLICATION OF WOUND VAC;  Surgeon: Leafy Ro, MD;  Location: [facility];  Service: General;  Laterality: N/A;  [id number]   BOWEL RESECTION N/A 04/30/2023   Procedure: SMALL BOWEL RESECTION;  Surgeon: Leafy Ro, MD;  Location: [facility];  Service: General;  Laterality: N/A;   COLON RESECTION SIGMOID N/A 02/12/2022   Procedure: COLON RESECTION SIGMOID, open, [name], PA-C to assist;  Surgeon: Leafy Ro, MD;  Location: [facility];  Service: General;  Laterality: N/A;   COLONOSCOPY WITH PROPOFOL N/A 11/21/2019   Procedure: COLONOSCOPY WITH PROPOFOL;  Surgeon: Toney Reil, MD;  Location: [facility];  Service: Gastroenterology;  Laterality: N/A;   COLONOSCOPY WITH PROPOFOL N/A 07/10/2021   Procedure: COLONOSCOPY WITH PROPOFOL;  Surgeon: Toney Reil, MD;  Location: [facility] ENDOSCOPY;  Service: Gastroenterology;  Laterality: N/A;   COLONOSCOPY WITH PROPOFOL N/A  02/06/2022   Procedure: COLONOSCOPY WITH PROPOFOL;  Surgeon: Toney Reil, MD;  Location: [facility] ENDOSCOPY;  over 60 Minutes Intravenous Every 24 hours 05/02/23 1530 05/03/23 0823   05/03/23 1800  vancomycin (VANCOREADY) IVPB 1250 mg/250 mL  Status:  Discontinued        1,250 mg 166.7 mL/hr over 90 Minutes Intravenous Every 24 hours 05/03/23 0823 05/03/23 1752    05/03/23 1800  Vancomycin (VANCOCIN) 1,250 mg in sodium chloride 0.9 % 250 mL IVPB  Status:  Discontinued        1,250 mg 166.7 mL/hr over 90 Minutes Intravenous Every 24 hours 05/03/23 1752 05/04/23 0755   05/02/23 2000  ceFEPIme (MAXIPIME) 2 g in sodium chloride 0.9 % 100 mL IVPB  Status:  Discontinued        2 g 200 mL/hr over 30 Minutes Intravenous Every 8 hours 05/02/23 1530 05/04/23 0236   05/02/23 1630  vancomycin (VANCOREADY) IVPB 2000 mg/400 mL        2,000 mg 200 mL/hr over 120 Minutes Intravenous  Once 05/02/23 1530 05/02/23 1746   05/02/23 1600  metroNIDAZOLE (FLAGYL) IVPB 500 mg  Status:  Discontinued        500 mg 100 mL/hr over 60 Minutes Intravenous Every 12 hours 05/02/23 1453 05/04/23 0236   05/01/23 1400  azithromycin (ZITHROMAX) 500 mg in sodium chloride 0.9 % 250 mL IVPB  Status:  Discontinued        500 mg 250 mL/hr over 60 Minutes Intravenous Every 24 hours 05/01/23 1255 05/02/23 1453   05/01/23 1230  azithromycin (ZITHROMAX) tablet 500 mg  Status:  Discontinued        500 mg Oral Daily 05/01/23 1135 05/01/23 1255   05/01/23 1200  cefTRIAXone (ROCEPHIN) 2 g in sodium chloride 0.9 % 100 mL IVPB  Status:  Discontinued        2 g 200 mL/hr over 30 Minutes Intravenous Every 24 hours 05/01/23 1135 05/02/23 1453   04/27/2023 2200  cefoTEtan (CEFOTAN) 2 g in sodium chloride 0.9 % 100 mL IVPB        2 g 200 mL/hr over 30 Minutes Intravenous Every 8 hours 04/17/2023 2058 04/29/23 1717   04/27/23 0945  cefoTEtan (CEFOTAN) 2 g in sodium chloride 0.9 % 100 mL IVPB        2 g 200 mL/hr over 30 Minutes Intravenous On call to O.R. 04/27/23 0856 05/05/2023 0559   04/05/23 0900  cefTRIAXone (ROCEPHIN) 2 g in sodium chloride 0.9 % 100 mL IVPB        2 g 200 mL/hr over 30 Minutes Intravenous Every 24 hours 04/05/23 0634 04/14/23 1254   12/31/2022 1000  ceFAZolin (ANCEF) IVPB 2g/100 mL premix        2 g 200 mL/hr over 30 Minutes Intravenous  Once 12/18/22 1738 01/05/2023 1141   09/17/22 1200   vancomycin (VANCOREADY) IVPB 1250 mg/250 mL  Status:  Discontinued        1,250 mg 166.7 mL/hr over 90 Minutes Intravenous Every 24 hours 09/16/22 1040 09/22/22 1139   09/16/22 1200  vancomycin (VANCOREADY) IVPB 1250 mg/250 mL        1,250 mg 166.7 mL/hr over 90 Minutes Intravenous  Once 09/15/22 1706 09/16/22 1255   09/15/22 1200  ceFEPIme (MAXIPIME) 2 g in sodium chloride 0.9 % 100 mL IVPB  Status:  Discontinued        2 g 200 mL/hr over 30 Minutes Intravenous Every 8 hours 09/15/22 1024 09/18/22 1026   09/15/22 1200  vancomycin (VANCOREADY) IVPB 2000 mg/400 mL        2,000  mg 200 mL/hr over 120 Minutes Intravenous  Once 09/15/22 1024 09/15/22 1500   03/04/22 2200  piperacillin-tazobactam (ZOSYN) IVPB 3.375 g        3.375 g 12.5 mL/hr over 240 Minutes Intravenous Every 8 hours 03/04/22 2005 03/18/22 0248   02/21/22 1500  vancomycin (VANCOREADY) IVPB 1250 mg/250 mL  Status:  Discontinued        1,250 mg 166.7 mL/hr over 90 Minutes Intravenous Every 24 hours 02/21/22 1111 02/24/22 1336   02/21/22 1400  meropenem (MERREM) 1 g in sodium chloride 0.9 % 100 mL IVPB        1 g 200 mL/hr over 30 Minutes Intravenous Every 8 hours 02/21/22 1258 02/27/22 2157   02/20/22 1500  vancomycin (VANCOCIN) IVPB 1000 mg/200 mL premix  Status:  Discontinued        1,000 mg 200 mL/hr over 60 Minutes Intravenous Every 24 hours 02/19/22 1706 02/21/22 1111   02/19/22 1115  vancomycin (VANCOREADY) IVPB 2000 mg/400 mL        2,000 mg 200 mL/hr over 120 Minutes Intravenous  Once 02/19/22 1025 02/19/22 1732   02/19/22 1115  fluconazole (DIFLUCAN) IVPB 400 mg        400 mg 100 mL/hr over 120 Minutes Intravenous Every 24 hours 02/19/22 1025 03/04/22 1301   02/14/2022 0730  piperacillin-tazobactam (ZOSYN) IVPB 3.375 g  Status:  Discontinued        3.375 g 12.5 mL/hr over 240 Minutes Intravenous Every 8 hours 02/28/2022 0639 02/21/22 1235   02/27/2022 0655  piperacillin-tazobactam (ZOSYN) 3.375 GM/50ML IVPB       Note  to Pharmacy: Loretha Stapler N: cabinet override      03/05/2022 0655 03/07/2022 0816   03/05/2022 2200  cefoTEtan (CEFOTAN) 2 g in sodium chloride 0.9 % 100 mL IVPB  Status:  Discontinued        2 g 200 mL/hr over 30 Minutes Intravenous Every 12 hours 02/07/2022 1541 02/18/2022 0639   02/06/2022 1345  cefoTEtan (CEFOTAN) 2 g in sodium chloride 0.9 % 100 mL IVPB        2 g 200 mL/hr over 30 Minutes Intravenous  Once 02/18/2022 1331 02/28/2022 2146   03/04/2022 1336  sodium chloride 0.9 % with cefoTEtan (CEFOTAN) ADS Med       Note to Pharmacy: Gates Rigg: cabinet override      02/06/2022 1336 02/14/22 0144   02/18/2022 0620  sodium chloride 0.9 % with cefoTEtan (CEFOTAN) ADS Med       Note to Pharmacy: Agnes Lawrence A: cabinet override      02/27/2022 0620 02/21/2022 0802   02/09/2022 0600  cefoTEtan (CEFOTAN) 2 g in sodium chloride 0.9 % 100 mL IVPB        2 g 200 mL/hr over 30 Minutes Intravenous On call to O.R. 02/12/22 2229 02/07/2022 2146      Significant Hospital Events: Including procedures, antibiotic start and stop dates in addition to other pertinent events   02/06/2022: initial laparotomy, excision of greater omental mass, abdominal wall reconstruction with [name], appendectomy, and placement of Prevena vac  02/26/2022: EXPLORATORY LAPAROTOMY REPAIR OF BOWEL PERFORATION, INSERTION OF MESH, APPLICATION OF WOUND VAC 02/26/22:  COLON RESECTION SIGMOID, HERNIA REPAIR VENTRAL, APPENDECTOMY, INSERTION OF MESH, REPAIR ABDOMINAL WALL 07/15/22: Neuro surgery consultation due to L1 compression fracture. Not a candidate for lumbosacral orthotic. 12/29/2022: Insertion of Port-a-cath 04/06/23: removal of port-a-cath Throughout stay patient has required TPN, WOC & ID consultation. 04/19/2023: 12 hours and surgery for  SCr of 0.87 mg/dL). Recent Labs  Lab 05/09/23 0754 05/11/23 0639 05/13/23 0504 05/13/2023 0644  WBC 4.7 3.8* 4.1 12.0*    Liver Function Tests: Recent Labs  Lab 05/09/23 0840 05/11/23 0639 05/13/23 0504  AST  --  91* 66*  ALT  --  98* 79*  ALKPHOS  --  153* 124  BILITOT  --  4.2* 4.9*  PROT  --  5.1* 5.0*  ALBUMIN 1.8* 1.7* <1.5*   No results for input(s): "LIPASE", "AMYLASE" in the last 168 hours. No results for input(s): "AMMONIA" in the last 168 hours.  ABG    Component Value Date/Time   PHART 7.31 (L) 04/29/2023 0005   PCO2ART 44 04/29/2023 0005   PO2ART 95 04/29/2023 0005   HCO3 25.9 05/04/2023 0430   ACIDBASEDEF 0.4 05/04/2023 0430   O2SAT 83.6 05/04/2023 0430     Coagulation Profile: Recent Labs  Lab 05/11/23 0639  INR 1.3*    Cardiac Enzymes: No results for input(s): "CKTOTAL", "CKMB", "CKMBINDEX", "TROPONINI" in the last 168 hours.  HbA1C: Hgb A1c MFr Bld  Date/Time Value Ref Range Status  04/02/2023 08:56 AM 6.8 (H) 4.8 - 5.6 % Final    Comment:    (NOTE) Pre diabetes:          5.7%-6.4%  Diabetes:              >6.4%  Glycemic control for   <7.0% adults with diabetes      CBG: Recent Labs  Lab 05/13/23 2020 05/13/23 2344 05/27/2023 0328 05/30/2023 0721 05/26/2023 1119  GLUCAP 107* 235* 245* 260* 238*    Review of Systems:   UTA patient intubated and sedated unable to participate in interview at this time  Past Medical History:  She,  has a past medical history of Anemia, Colon cancer (HCC) (04/2020), History of kidney stones, Hypothyroidism, and Thyroid disease.   Surgical History:   Past Surgical History:  Procedure Laterality Date   ABDOMINAL WALL DEFECT REPAIR N/A 02/28/2022   Procedure: REPAIR ABDOMINAL WALL;  Surgeon: Leafy Ro, MD;  Location: [facility] OR[facility];  Service: General;  Laterality: N/A;   ABDOMINAL WALL DEFECT REPAIR N/A 04/12/2023   Procedure: Reconstruction of ABDOMINAL WALL;  Surgeon: Leafy Ro, MD;  Location: [facility];  Service: General;  Laterality: N/A;   APPENDECTOMY N/A 03/01/2022   Procedure: APPENDECTOMY;  Surgeon: Leafy Ro, MD;  Location: [facility];  Service: General;  Laterality: N/A;   APPLICATION OF WOUND VAC N/A 02/25/2022   Procedure: APPLICATION OF WOUND VAC;  Surgeon: Leafy Ro, MD;  Location: [facility];  Service: General;  Laterality: N/A;  [id number]   BOWEL RESECTION N/A 04/30/2023   Procedure: SMALL BOWEL RESECTION;  Surgeon: Leafy Ro, MD;  Location: [facility];  Service: General;  Laterality: N/A;   COLON RESECTION SIGMOID N/A 02/12/2022   Procedure: COLON RESECTION SIGMOID, open, [name], PA-C to assist;  Surgeon: Leafy Ro, MD;  Location: [facility];  Service: General;  Laterality: N/A;   COLONOSCOPY WITH PROPOFOL N/A 11/21/2019   Procedure: COLONOSCOPY WITH PROPOFOL;  Surgeon: Toney Reil, MD;  Location: [facility];  Service: Gastroenterology;  Laterality: N/A;   COLONOSCOPY WITH PROPOFOL N/A 07/10/2021   Procedure: COLONOSCOPY WITH PROPOFOL;  Surgeon: Toney Reil, MD;  Location: [facility] ENDOSCOPY;  Service: Gastroenterology;  Laterality: N/A;   COLONOSCOPY WITH PROPOFOL N/A  02/06/2022   Procedure: COLONOSCOPY WITH PROPOFOL;  Surgeon: Toney Reil, MD;  Location: [facility] ENDOSCOPY;  mg 200 mL/hr over 120 Minutes Intravenous  Once 09/15/22 1024 09/15/22 1500   03/04/22 2200  piperacillin-tazobactam (ZOSYN) IVPB 3.375 g        3.375 g 12.5 mL/hr over 240 Minutes Intravenous Every 8 hours 03/04/22 2005 03/18/22 0248   02/21/22 1500  vancomycin (VANCOREADY) IVPB 1250 mg/250 mL  Status:  Discontinued        1,250 mg 166.7 mL/hr over 90 Minutes Intravenous Every 24 hours 02/21/22 1111 02/24/22 1336   02/21/22 1400  meropenem (MERREM) 1 g in sodium chloride 0.9 % 100 mL IVPB        1 g 200 mL/hr over 30 Minutes Intravenous Every 8 hours 02/21/22 1258 02/27/22 2157   02/20/22 1500  vancomycin (VANCOCIN) IVPB 1000 mg/200 mL premix  Status:  Discontinued        1,000 mg 200 mL/hr over 60 Minutes Intravenous Every 24 hours 02/19/22 1706 02/21/22 1111   02/19/22 1115  vancomycin (VANCOREADY) IVPB 2000 mg/400 mL        2,000 mg 200 mL/hr over 120 Minutes Intravenous  Once 02/19/22 1025 02/19/22 1732   02/19/22 1115  fluconazole (DIFLUCAN) IVPB 400 mg        400 mg 100 mL/hr over 120 Minutes Intravenous Every 24 hours 02/19/22 1025 03/04/22 1301   02/14/2022 0730  piperacillin-tazobactam (ZOSYN) IVPB 3.375 g  Status:  Discontinued        3.375 g 12.5 mL/hr over 240 Minutes Intravenous Every 8 hours 02/28/2022 0639 02/21/22 1235   02/27/2022 0655  piperacillin-tazobactam (ZOSYN) 3.375 GM/50ML IVPB       Note  to Pharmacy: Loretha Stapler N: cabinet override      03/05/2022 0655 03/07/2022 0816   03/05/2022 2200  cefoTEtan (CEFOTAN) 2 g in sodium chloride 0.9 % 100 mL IVPB  Status:  Discontinued        2 g 200 mL/hr over 30 Minutes Intravenous Every 12 hours 02/07/2022 1541 02/18/2022 0639   02/06/2022 1345  cefoTEtan (CEFOTAN) 2 g in sodium chloride 0.9 % 100 mL IVPB        2 g 200 mL/hr over 30 Minutes Intravenous  Once 02/18/2022 1331 02/28/2022 2146   03/04/2022 1336  sodium chloride 0.9 % with cefoTEtan (CEFOTAN) ADS Med       Note to Pharmacy: Gates Rigg: cabinet override      02/06/2022 1336 02/14/22 0144   02/18/2022 0620  sodium chloride 0.9 % with cefoTEtan (CEFOTAN) ADS Med       Note to Pharmacy: Agnes Lawrence A: cabinet override      02/27/2022 0620 02/21/2022 0802   02/09/2022 0600  cefoTEtan (CEFOTAN) 2 g in sodium chloride 0.9 % 100 mL IVPB        2 g 200 mL/hr over 30 Minutes Intravenous On call to O.R. 02/12/22 2229 02/07/2022 2146      Significant Hospital Events: Including procedures, antibiotic start and stop dates in addition to other pertinent events   02/06/2022: initial laparotomy, excision of greater omental mass, abdominal wall reconstruction with [name], appendectomy, and placement of Prevena vac  02/26/2022: EXPLORATORY LAPAROTOMY REPAIR OF BOWEL PERFORATION, INSERTION OF MESH, APPLICATION OF WOUND VAC 02/26/22:  COLON RESECTION SIGMOID, HERNIA REPAIR VENTRAL, APPENDECTOMY, INSERTION OF MESH, REPAIR ABDOMINAL WALL 07/15/22: Neuro surgery consultation due to L1 compression fracture. Not a candidate for lumbosacral orthotic. 12/29/2022: Insertion of Port-a-cath 04/06/23: removal of port-a-cath Throughout stay patient has required TPN, WOC & ID consultation. 04/19/2023: 12 hours and surgery for  neck supple Neuro: Sedated, RASS -4 (immediately postop), unable to follow commands, PERRL  CV: NSR, s1s2, no m/r/g, 2+ radial/1+ distal pulses, 2+ generalized edema  Pulm: Diminished throughout, even, non labored GI: No audible BS, soft,  GU: foley in place with clear amber urine Skin:  See below    Extremities: Warm/dry, normal bulk and tone   Resolved Hospital Problem list     Assessment &  Plan:  #Mechanical intubation peri-operatively   - Ventilator settings: PRVC  8 mL/kg, 100% FiO2, 5 PEEP, continue ventilator support & lung protective strategies - Wean PEEP & FiO2 as tolerated, maintain SpO2 > 90% - Head of bed elevated 30 degrees, VAP protocol in place - Plateau pressures less than 30 cm H20  - Intermittent chest x-ray & ABG PRN - Daily WUA with SBT as tolerated  - Ensure adequate pulmonary hygiene  - Bronchodilators PRN - PAD protocol in place: continue propofol and fentanyl gtts to maintain RASS goal  - Pt to remain mechanically intubated until she returns back to the OR per General Surgery   #Reopening of laparotomy; excision/explantation of mesh (total); small bowel resection x 2 including SB anastomosis; creation of end Colostomy Hartmann's to the right of the abdominal wall; double barrell end proximal ileostomy; repair of colotomy cecum;  placement of abthera wound vac~009/27/2024 - General Surgery managing, appreciate input - Monitor wound vac  - Turn every 2 utilize offloading devices as needed - Continue cyclic TPN per general surgery recommendations - Keep NPO for now   #Circulatory shock - Follow-up stat lactic, BMP, and CBC  - Continue levophed drip, wean as tolerated to maintain MAP > 65 - Continue albumin  - Continuous cardiac monitoring  #Hypothyroidism - NPO postop, consider transitioning Synthroid to IV route if patient remains n.p.o. > 5 days  #Type II DM - CBG's q4hrs  - SSI , scheduled novolog and semglee  - If CBG's remain elevated will require insulin gtt   Best Practice (right click and "Reselect all SmartList Selections" daily)  Diet/type: NPO DVT prophylaxis: SCD GI prophylaxis: PPI Lines: Central line, Arterial Line, and yes and it is still needed Foley:  Yes, and it is still needed Code Status:  full code Last date of multidisciplinary goals of care discussion [N/A]  Labs   CBC: Recent Labs  Lab 05/09/23 0754 05/11/23 0639  05/13/23 0504 06/08/2023 0644  WBC 4.7 3.8* 4.1 12.0*  HGB 9.5* 9.1* 8.0* 9.1*  HCT 30.6* 29.0* 24.5* 28.9*  MCV 96.2 96.7 93.5 95.7  PLT 56* 46* 39* 53*    Basic Metabolic Panel: Recent Labs  Lab 05/09/23 0840 05/10/23 0359 05/10/23 1446 05/11/23 0639 05/12/23 0643 05/13/23 0504 05/28/2023 0644  NA 155* 157* 143 150* 148* 141 134*  K 4.3 4.3  --  4.6 3.7 3.5 4.1  CL 120* 122*  --  117* 113* 106 99  CO2 27 26  --  26 28 27  21*  GLUCOSE 204* 194*  --  145* 196* 259* 291*  BUN 54* 41*  --  46* 45* 48* 43*  CREATININE 0.82 0.62  --  0.90 0.99 1.08* 0.87  CALCIUM 8.1* 8.1*  --  7.8* 7.7* 7.5* 7.1*  MG 2.8*  --   --  2.2  --   --   --   PHOS 2.3*  --   --  3.6  --   --   --    GFR: Estimated Creatinine Clearance: 74.3 mL/min (by C-G formula based on

## 2023-05-14 NOTE — Anesthesia Procedure Notes (Signed)
Arterial Line Insertion Start/End9/01/2023 12:30 PM, 05/14/2023 1:15 PM Performed by: Stephanie Coup, MD, anesthesiologist  Patient location: OR. Preanesthetic checklist: patient identified, IV checked, site marked, risks and benefits discussed, surgical consent, monitors and equipment checked, pre-op evaluation, timeout performed and anesthesia consent Lidocaine 1% used for infiltration radial was placed Catheter size: 20 G Hand hygiene performed  and maximum sterile barriers used   Attempts: 3 Procedure performed using ultrasound guided technique. Following insertion, dressing applied. Post procedure assessment: normal and unchanged  Post procedure complications: hemorrhage, unsuccessful attempts and second provider assisted. Patient tolerated the procedure with difficulty.

## 2023-05-14 NOTE — Progress Notes (Signed)
Pt back to room and placed on monitor. Report received from OR prior to arrival and new wounds/ostomy viewed on arrival at bedside.

## 2023-05-14 NOTE — Progress Notes (Signed)
PHARMACY - TOTAL PARENTERAL NUTRITION CONSULT NOTE   Indication: Prolonged ileus  Patient Measurements: Height: 4\' 11"  (149.9 cm) Weight: 104.1 kg (229 lb 8 oz) IBW/kg (Calculated) : 43.2 TPN AdjBW (KG): 59 Body mass index is 46.35 kg/m.  Assessment: Debra [name] is a 57 y.o. female s/p laparotomy, excision of greater omental mass, abdominal wall reconstruction with [name], appendectomy, and placement of Prevena vac.  Glucose / Insulin:  BG last 24h: 107 - 260 SSI last 24h: 26u rSSI q4h + Novolog 4u q4h + Semglee 22u daily + insulin removed from TPN 8/26 Electrolytes: Hypernatremia, hyperchloremia, hypermagnesemia, hypophosphatemia Renal: SCr <1, fairly stable Hepatic: AST/ALT trending up I&O:  GI Imaging: 9/11 CTAP: no new acute issues GI Surgeries / Procedures:  02/15/22: S/p laparotomy, excision of greater omental mass, abdominal wall reconstruction with [name], appendectomy, and placement of Prevena vac 12/19/22: Placement of right internal jugular port-a-catheter with dual reservoirs also s/p re-opening of laparotomy for repair of small bowel perforation  04/28/23:  Exploratory laparotomy, extensive lysis of adhesion, 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, partial gastrectomy, abdominal wall reconstruction with mesh placement, and wound vac placement   Central access: PICC 04/09/2023   TPN start date: 04/09/23 TPN from 02/15/2022 to 04/05/2023 - held for bacteremia and TPN restarted 04/09/23 Previously Cyclic TPN, transitioned back to regular TPN on 04/29/23 post surgery  RD Assessment:  Estimated Needs Total Energy Estimated Needs: 1900-2200kcal/day Total Protein Estimated Needs: 95-110g/day Total Fluid Estimated Needs: 1.4-1.6L/day  Current Nutrition: NPO  Plan:  --Continue TPN at new goal rate of 65 mL/hr --Electrolytes in TPN: Na 10 mEq/L, K 13mEq/L, Ca 30mEq/L, Mg 76mEq/L(removed on 8/31), and Phos  84mmol/L(increased on 8/31) --Acetate maximized as chloride levels remain elevated --Continue multivitamin, trace elements, zinc, selenium and chromium chloride to TPN MD will manage glucose via [place] insulin regimen --Monitor TPN labs daily until stable then on Mon/Thurs  Debra [name] 05/14/2023 7:11 AM

## 2023-05-14 NOTE — Progress Notes (Signed)
[facility] SURGICAL PROGRESS NOTE  Hospital Day(s): 455.   Post op day(s): 16 Days Post-Op.   Interval History:  Patient seen and examined No acute events or new complaints overnight.  She is needing 25 mcg/min Levophed Leukocytosis this AM to 12.0K Hgb to 9.1 (from 8.0) PLT 53K BMP is pending this morning  NGT in place; minimal output; not recorded  Surgical drains as follow:   - LLQ (abdominal wall); 10 ccs; sero-feculent in appearance   - RLQ (Intra-abdominal); 190ccs; sero-feculent in appearance  Now with [name] w/ suction; output 1000 ccs; sero-feculent in appearance  There is also "other" documented at 1200 ccs out On Unasyn NPO + TPN CT yesterday without intra-abdominal collections   Vital signs in last 24 hours: [min-max] current  Temp:  [98 F (36.7 C)-98.6 F (37 C)] 98.4 F (36.9 C) (09/05 0015) Pulse Rate:  [66-89] 69 (09/05 0515) Resp:  [12-23] 14 (09/05 0515) BP: (55-111)/(28-61) 102/54 (09/05 0730) SpO2:  [95 %-100 %] 95 % (09/05 0515)     Height: 4\' 11"  (149.9 cm) Weight: 104.1 kg BMI (Calculated): 46.33   Intake/Output last 2 shifts:  09/04 0701 - 09/05 0700 In: 8672.8 [I.V.:4488.6; NG/GT:1100; IV Piggyback:3084.2] Out: 4615 [Urine:2215; Drains:200; Stool:1000]   Physical Exam:  Constitutional: Resting in bed; NAD HEENT: NGT in place; minimal output Respiratory: breathing non-labored at rest; on [place] Cardiovascular: regular rate and sinus rhythm  Gastrointestinal: soft, and non-distended. LLQ drain (abdominal wall); serous output. RLQ drain (intra-abdominal); both with more sero-feculent appearance in drainage Integumentary: Midline wound now with [name] in place, Mesh visible. There appears to be output from left lateral edge, no visible bowel   Labs:     Latest Ref Rng & Units 05/14/2023    6:44 AM 05/13/2023    5:04 AM 05/11/2023    6:39 AM  CBC  WBC 4.0 - 10.5 K/uL 12.0  4.1  3.8   Hemoglobin 12.0 - 15.0 g/dL 9.1  8.0  9.1    Hematocrit 36.0 - 46.0 % 28.9  24.5  29.0   Platelets 150 - 400 K/uL 53  39  46       Latest Ref Rng & Units 05/13/2023    5:04 AM 05/12/2023    6:43 AM 05/11/2023    6:39 AM  CMP  Glucose 70 - 99 mg/dL 409  811  914   BUN 6 - 20 mg/dL 48  45  46   Creatinine 0.44 - 1.00 mg/dL 7.82  9.56  2.13   Sodium 135 - 145 mmol/L 141  148  150   Potassium 3.5 - 5.1 mmol/L 3.5  3.7  4.6   Chloride 98 - 111 mmol/L 106  113  117   CO2 22 - 32 mmol/L 27  28  26    Calcium 8.9 - 10.3 mg/dL 7.5  7.7  7.8   Total Protein 6.5 - 8.1 g/dL 5.0   5.1   Total Bilirubin 0.3 - 1.2 mg/dL 4.9   4.2   Alkaline Phos 38 - 126 U/L 124   153   AST 15 - 41 U/L 66   91   ALT 0 - 44 U/L 79   98     Imaging studies:   CT Abdomen/Pelvis (05/13/2023) personally reviewed with known likely fistula, no undrained collections, no free air, and radiologist report reivewed below:  IMPRESSION: Small bilateral pleural effusions with adjacent subsegmental atelectasis.   Status post both large and small bowel resection. Large midline  surgical wound remains. Does appear to be high density material within the wound in its inferior portion suggesting enterocutaneous fistula, but it is difficult to determine exactly where the fistula is, but potentially may be in the left lower quadrant.   Assessment/Plan:  57 y.o. 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    - Very challenging situation without great answers. For now, continue aggressive supportive care, vasopressors support, slow bowel motility as much as feasible, parenteral nutrition. No immediate plans for surgical re-intervention, she is in quite fragile state, this would assume significant risk.  Last resort would be end ileostomy vs jejunostomy and mucus fistula.   - Discussed with medicine; appreciate help; will add BID albumin  - Continue [name]; LIS with red rubber; holding well. Due for change in next 24 hours; will discuss with WOC - Continue NPO; TPN at goal - Continue Octreotide 200 mcg TID; Imodium 4 mg TID - Continue Abx; Unasyn on 08/29; complete 7 days total - Continue NGT; LIS - Continue surgical drains x2; monitor and record output  - Monitor leukocytosis - Monitor abdominal examination  - Pain control prn; antiemetics prn  -- [name], PA-C [facility] 05/14/2023, 7:33 AM M-F: 7am - 4pm

## 2023-05-14 NOTE — Anesthesia Procedure Notes (Signed)
Procedure Name: Intubation Date/Time: 05/14/2023 1:13 PM  Performed by: Stephanie Coup, MDPre-anesthesia Checklist: Patient identified, Emergency Drugs available, Suction available and Patient being monitored Patient Re-evaluated:Patient Re-evaluated prior to induction Oxygen Delivery Method: Circle system utilized Preoxygenation: Pre-oxygenation with 100% oxygen Induction Type: Rapid sequence and IV induction Laryngoscope Size: McGraph and 3 Grade View: Grade II Tube type: Oral Tube size: 6.5 mm Number of attempts: 1 Airway Equipment and Method: Stylet and Video-laryngoscopy Placement Confirmation: ETT inserted through vocal cords under direct vision, positive ETCO2 and breath sounds checked- equal and bilateral Secured at: 19 cm Tube secured with: Tape Dental Injury: Teeth and Oropharynx as per pre-operative assessment  Comments: Grade 2b to 3 view.

## 2023-05-14 NOTE — Progress Notes (Signed)
Abaigeal is 2 weeks out after enterocutaneous fistula takedown and small bowel resection.  Last 24 hours her overall condition has deteriorated.  She denies in septic shock.  Her [name] pouch now is more than 2 L and the repeated CT scan shows some contrast extravasation.  There is no evidence of an abscess.  I personally reviewed the images compared to the prior ones from a week ago.  There is more contrast extravasation and does not seem to be from the small bowel anastomosis.  I am concerned for a potential missed bowel injury or other viscus injuries.  Given that his clinical condition is worse I do think that we need to explore her.  I understand that this is going to be an extremely challenging case.  I have not taken this decision lightly and I have discussed at length with my colleagues including two experience  surgeons, as well as with a radiologist. I also have weighted the risks vs the benefits and the possible complications.  Currently there is not a good answer but I do think the safest decision is to explore her . There is a good chance that things may be futile but I do think we have to potentially look for reversible causes. She now is on pressors . I discussed with the family in detail and her. She seems to be understanding of the situation, she is not focal but she is in pain and w some degree of encephalopathy. I have also asked Dr Maia Plan and Dr. Aleen Campi for their help in this extremely complicated case. I have also d/w critical care team and anesthesia team.

## 2023-05-14 NOTE — Progress Notes (Signed)
PT Cancellation Note  Patient Details Name: Debra [name] MRN: [id number] DOB: [birth year]   Cancelled Treatment:    Reason Eval/Treat Not Completed: Medical issues which prohibited therapy (Per chart review, patient status post repeat abdominal surgery/exploration this date; remains intubated/sedated post-procedure. Per guidelines, will require new orders to resume therapy services post-op. Please re-consult as medically appropriate)   Daxtin Leiker H. Manson Passey, PT, DPT, NCS 05/14/23, 10:47 PM [phone]

## 2023-05-14 NOTE — Progress Notes (Signed)
Pharmacy Antibiotic Note  Debra [name] is a 57 y.o. female admitted on 02/13/2022 with sepsis.  Pharmacy has been consulted for Zosyn dosing.  Plan: Zosyn 3.375 grams every 8 hours (4 hour infusion)  Height: 4\' 11"  (149.9 cm) Weight: 104.1 kg (229 lb 8 oz) IBW/kg (Calculated) : 43.2  Temp (24hrs), Avg:98.1 F (36.7 C), Min:97.6 F (36.4 C), Max:98.5 F (36.9 C)  Recent Labs  Lab 05/09/23 0754 05/09/23 0840 05/11/23 0639 05/12/23 0643 05/13/23 0504 05/14/23 0644 05/14/23 1644  WBC 4.7  --  3.8*  --  4.1 12.0* 12.7*  CREATININE  --    < > 0.90 0.99 1.08* 0.87 0.82   < > = values in this interval not displayed.    Estimated Creatinine Clearance: 78.8 mL/min (by C-G formula based on SCr of 0.82 mg/dL).    No Known Allergies  Antimicrobials this admission: Unasyn  8/30 >> 9/5 fluconazole 9/5 >>   Dose adjustments this admission: N/a  Microbiology results:  Thank you for allowing pharmacy to be a part of this patient's care.   Elliot Gurney, PharmD, BCPS Clinical Pharmacist  05/14/2023 7:14 PM

## 2023-05-15 ENCOUNTER — Encounter: Payer: Self-pay | Admitting: Surgery

## 2023-05-15 ENCOUNTER — Other Ambulatory Visit: Payer: Self-pay

## 2023-05-15 LAB — PREPARE PLATELET PHERESIS
Unit division: 0
Unit division: 0

## 2023-05-15 LAB — CBC
HCT: 21.1 % — ABNORMAL LOW (ref 36.0–46.0)
HCT: 19.6 % — ABNORMAL LOW (ref 36.0–46.0)
Hemoglobin: 7.8 g/dL — ABNORMAL LOW (ref 12.0–15.0)
Hemoglobin: 6.8 g/dL — ABNORMAL LOW (ref 12.0–15.0)
MCH: 32.8 pg (ref 26.0–34.0)
MCH: 30.5 pg (ref 26.0–34.0)
MCHC: 37 g/dL — ABNORMAL HIGH (ref 30.0–36.0)
MCHC: 34.7 g/dL (ref 30.0–36.0)
MCV: 88.7 fL (ref 80.0–100.0)
MCV: 87.9 fL (ref 80.0–100.0)
Platelets: 40 10*3/uL — ABNORMAL LOW (ref 150–400)
Platelets: 39 10*3/uL — ABNORMAL LOW (ref 150–400)
RBC: 2.38 MIL/uL — ABNORMAL LOW (ref 3.87–5.11)
RBC: 2.23 MIL/uL — ABNORMAL LOW (ref 3.87–5.11)
RDW: 20.8 % — ABNORMAL HIGH (ref 11.5–15.5)
RDW: 20.5 % — ABNORMAL HIGH (ref 11.5–15.5)
WBC: 6.8 10*3/uL (ref 4.0–10.5)
WBC: 11.3 10*3/uL — ABNORMAL HIGH (ref 4.0–10.5)
nRBC: 0.9 % — ABNORMAL HIGH (ref 0.0–0.2)
nRBC: 1.6 % — ABNORMAL HIGH (ref 0.0–0.2)

## 2023-05-15 LAB — BASIC METABOLIC PANEL
Anion gap: 11 (ref 5–15)
BUN: 29 mg/dL — ABNORMAL HIGH (ref 6–20)
CO2: 21 mmol/L — ABNORMAL LOW (ref 22–32)
Calcium: 6.9 mg/dL — ABNORMAL LOW (ref 8.9–10.3)
Chloride: 104 mmol/L (ref 98–111)
Creatinine, Ser: 0.62 mg/dL (ref 0.44–1.00)
GFR, Estimated: >60 mL/min (ref >60)
Glucose, Bld: 265 mg/dL — ABNORMAL HIGH (ref 70–99)
Potassium: 3.5 mmol/L (ref 3.5–5.1)
Sodium: 136 mmol/L (ref 135–145)

## 2023-05-15 LAB — PROTIME-INR
INR: 1.7 — ABNORMAL HIGH (ref 0.8–1.2)
Prothrombin Time: 20.3 s — ABNORMAL HIGH (ref 11.4–15.2)

## 2023-05-15 LAB — BLOOD GAS, VENOUS
Acid-base deficit: 5.2 mmol/L — ABNORMAL HIGH (ref 0.0–2.0)
Bicarbonate: 23 mmol/L (ref 20.0–28.0)
O2 Saturation: 65.2 %
Patient temperature: 37
pCO2, Ven: 55 mmHg (ref 44–60)
pH, Ven: 7.23 — ABNORMAL LOW (ref 7.25–7.43)
pO2, Ven: 35 mmHg (ref 32–45)

## 2023-05-15 LAB — BPAM PLATELET PHERESIS
Blood Product Expiration Date: 202409072359
Blood Product Expiration Date: 202409082359
ISSUE DATE / TIME: 202409051240
ISSUE DATE / TIME: 202409051526
Unit Type and Rh: 7300
Unit Type and Rh: 5100

## 2023-05-15 LAB — GLUCOSE, CAPILLARY
Glucose-Capillary: 163 mg/dL — ABNORMAL HIGH (ref 70–99)
Glucose-Capillary: 165 mg/dL — ABNORMAL HIGH (ref 70–99)
Glucose-Capillary: 170 mg/dL — ABNORMAL HIGH (ref 70–99)
Glucose-Capillary: 170 mg/dL — ABNORMAL HIGH (ref 70–99)
Glucose-Capillary: 177 mg/dL — ABNORMAL HIGH (ref 70–99)
Glucose-Capillary: 182 mg/dL — ABNORMAL HIGH (ref 70–99)
Glucose-Capillary: 188 mg/dL — ABNORMAL HIGH (ref 70–99)
Glucose-Capillary: 189 mg/dL — ABNORMAL HIGH (ref 70–99)
Glucose-Capillary: 190 mg/dL — ABNORMAL HIGH (ref 70–99)
Glucose-Capillary: 192 mg/dL — ABNORMAL HIGH (ref 70–99)
Glucose-Capillary: 195 mg/dL — ABNORMAL HIGH (ref 70–99)
Glucose-Capillary: 200 mg/dL — ABNORMAL HIGH (ref 70–99)
Glucose-Capillary: 217 mg/dL — ABNORMAL HIGH (ref 70–99)
Glucose-Capillary: 242 mg/dL — ABNORMAL HIGH (ref 70–99)
Glucose-Capillary: 275 mg/dL — ABNORMAL HIGH (ref 70–99)
Glucose-Capillary: 276 mg/dL — ABNORMAL HIGH (ref 70–99)
Glucose-Capillary: 293 mg/dL — ABNORMAL HIGH (ref 70–99)
Glucose-Capillary: 309 mg/dL — ABNORMAL HIGH (ref 70–99)
Glucose-Capillary: 81 mg/dL (ref 70–99)
Glucose-Capillary: 93 mg/dL (ref 70–99)

## 2023-05-15 LAB — LACTIC ACID, PLASMA
Lactic Acid, Venous: 3.9 mmol/L (ref 0.5–1.9)
Lactic Acid, Venous: 4 mmol/L (ref 0.5–1.9)

## 2023-05-15 LAB — TRIGLYCERIDES: Triglycerides: 882 mg/dL — ABNORMAL HIGH (ref <150)

## 2023-05-15 LAB — PATHOLOGIST SMEAR REVIEW

## 2023-05-15 LAB — PREPARE RBC (CROSSMATCH)

## 2023-05-15 MED ORDER — ORAL CARE MOUTH RINSE: 15.0000 mL | OROMUCOSAL | Status: DC | PRN

## 2023-05-15 MED ORDER — MIDAZOLAM-SODIUM CHLORIDE 100-0.9 MG/100ML-% IV SOLN
0.0000 mg/h | INTRAVENOUS | Status: DC
  Administered 2023-05-15: 2 mg/h via INTRAVENOUS
  Administered 2023-05-16 – 2023-05-17 (×2): 3 mg/h via INTRAVENOUS
  Filled 2023-05-15 (×3): qty 100

## 2023-05-15 MED ORDER — MIDAZOLAM BOLUS VIA INFUSION: 0.0000 mg | INTRAVENOUS | Status: DC | PRN

## 2023-05-15 MED ORDER — LACTATED RINGERS IV BOLUS
1000.0000 mL | Freq: Once | INTRAVENOUS | Status: AC
  Administered 2023-05-15: 1000 mL via INTRAVENOUS

## 2023-05-15 MED ORDER — ORAL CARE MOUTH RINSE
15.0000 mL | OROMUCOSAL | Status: DC
  Administered 2023-05-15 – 2023-05-17 (×34): 15 mL via OROMUCOSAL

## 2023-05-15 MED ORDER — SODIUM CHLORIDE 0.9% IV SOLUTION: Freq: Once | INTRAVENOUS | Status: DC

## 2023-05-15 MED ORDER — CALCIUM GLUCONATE-NACL 1-0.675 GM/50ML-% IV SOLN
1.0000 g | Freq: Once | INTRAVENOUS | Status: AC
  Administered 2023-05-15: 1000 mg via INTRAVENOUS
  Filled 2023-05-15: qty 50

## 2023-05-15 MED ORDER — SODIUM CHLORIDE 0.9% IV SOLUTION: Freq: Once | INTRAVENOUS | Status: AC

## 2023-05-15 MED ORDER — ZINC CHLORIDE 1 MG/ML IV SOLN
INTRAVENOUS | Status: AC
  Filled 2023-05-15: qty 665.6

## 2023-05-15 NOTE — Progress Notes (Signed)
Peripherally Inserted Central Catheter Placement  The IV Nurse has discussed with the patient and/or persons authorized to consent for the patient, the purpose of this procedure and the potential benefits and risks involved with this procedure.  The benefits include less needle sticks, lab draws from the catheter, and the patient may be discharged home with the catheter. Risks include, but not limited to, infection, bleeding, blood clot (thrombus formation), and puncture of an artery; nerve damage and irregular heartbeat and possibility to perform a PICC exchange if needed/ordered by physician.  Alternatives to this procedure were also discussed.  Bard Power PICC patient education guide, fact sheet on infection prevention and patient information card has been provided to patient /or left at bedside.    PICC Placement Documentation  PICC Triple Lumen 05/15/23 Right Cephalic 41 cm 0 cm (Active)  Indication for Insertion or Continuance of Line Vasoactive infusions 05/15/23 1700  Exposed Catheter (cm) 0 cm 05/15/23 1700  Site Assessment Clean, Dry, Intact 05/15/23 1700  Lumen #1 Status Flushed;Saline locked;Blood return noted 05/15/23 1700  Lumen #2 Status Flushed;Saline locked;Blood return noted 05/15/23 1700  Lumen #3 Status Flushed;Saline locked;Blood return noted 05/15/23 1700  Dressing Type Transparent;Securing device 05/15/23 1700  Dressing Status Antimicrobial disc in place 05/15/23 1700  Line Care Connections checked and tightened 05/15/23 1700  Line Adjustment (NICU/IV Team Only) No 05/15/23 1700  Dressing Intervention New dressing 05/15/23 1700  Dressing Change Due 05/22/23 05/15/23 1700       Vernona Rieger  Scorpio Fortin 05/15/2023, 5:39 PM

## 2023-05-15 NOTE — TOC Progression Note (Signed)
Transition of Care [facility]) - Progression Note    Patient Details  Name: Kalifa Cauthon MRN: [id number] Date of Birth: [birth year]  [facility]) CM/SW Contact  Kreg Shropshire, RN Phone Number: 05/15/2023, 12:32 PM  Clinical Narrative:    Cm continue to follow for toc needs.   Expected Discharge Plan: Home w Home Health Services Barriers to Discharge: Continued Medical Work up  Expected Discharge Plan and Services                                               Social Determinants of Health (SDOH) Interventions SDOH Screenings   Food Insecurity: No Food Insecurity (05/20/2022)  Housing: Low Risk  (05/20/2022)  Transportation Needs: No Transportation Needs (05/20/2022)  Utilities: Not At Risk (05/20/2022)  Tobacco Use: Low Risk  (05/19/2022)    Readmission Risk Interventions     No data to display

## 2023-05-15 NOTE — Inpatient Diabetes Management (Signed)
Inpatient Diabetes Program Recommendations  AACE/ADA: New Consensus Statement on Inpatient Glycemic Control (2015)  Target Ranges:  Prepandial:   less than 140 mg/dL      Peak postprandial:   less than 180 mg/dL (1-2 hours)      Critically ill patients:  140 - 180 mg/dL    Latest Reference Range & Units 06/08/2023 12:48 06/04/2023 20:36 05/19/2023 20:55 05/22/2023 23:53 05/15/23 01:13 05/15/23 02:13 05/15/23 03:37 05/15/23 06:08  Glucose-Capillary 70 - 99 mg/dL 213 (H) 086 (H) 578 (H)  IV Insulin Drip Started at 2214 293 (H) 309 (H) 276 (H) 275 (H) 242 (H)  IV Insulin Drip Running  (H): Data is abnormally high    Current Orders: IV Insulin Drip    Taken to OR yest emergently  Started IV Insulin Drip for Hyperglycemia around 10pm last night--Lab Glucose at 7pm was 469  5:29am BMET shows the following: Glucose 265 Anion Gap 11 CO2 level 21  TPN running 65cc/hr  Pt was getting Semglee 22 units Daily + Novolog 0-20 units Q4H on SSI + Novolog 4 units Q4H for TPN coverage prior to needing IV Insulin Drip      MD- Please leave pt on the IV Insulin Drip until she has 4 consecutive CBGs <180 and IV Insulin Drip Rate is 4 units/hr or less  IV Insulin likely best choice for glucose control given critical condition    --Will follow patient during hospitalization--  Ambrose Finland RN, MSN, CDCES Diabetes Coordinator Inpatient Glycemic Control Team Team Pager: [phone] (8a-5p)

## 2023-05-15 NOTE — Consult Note (Signed)
to L1 compression fracture. Not a candidate for lumbosacral orthotic. 12/21/2022: Insertion of Port-a-cath 04/06/23: removal of port-a-cath Throughout stay patient has required TPN, WOC & ID consultation. 04/27/2023: 12 hours and surgery for abdominal wall  reconstruction, recurrent incisional hernia repair, partial gastrectomy, small bowel resection, repair enterotomy x 3 and wound VAC placement.  Patient extubated postop but too lethargic to maintain airway requiring reintubation and transferred to ICU for postoperative mechanical ventilatory management. 04/29/23: Pt remains mechanically intubated FiO2 40%/PEEP 5.  Pt awake and with purposeful movement on precedex gtt.  Will perform SBT with plans for possible extubation  04/30/23: hospitalist consulted for hyperglycemia and coagulopathy. Overnight fever to 101.8, tachycardic, WBC WNL but concern for SIRS/sepsis 05/01/23: pro calcitonin (+), starting tx for HCAP. MRSA resp screen recently neg 08/20, will get sputum cx. Echo EF 60-65%, G1DD 05/02/23: hypernatremic, inflammatory markers up (ferritin high, ALP high), still septic/SIRS, escalating abx, repeat CXR, very cautious fluid administration to help reduce sodium and if increased SOB would d/c fluids and repeat CXR, increased insulin, repeat albumin  05/03/23: Tachycardia has improved, still tachypnea but no longer meeting sepsis/sirs criteria. Sodium about same 152, Hgb stable. Relatively low UOsm 444 c/w osmotic diuresis (get Glc controlled) / loop diuretics (d/c Lasix) / ADH resistance (correct potassium though hypokalemia is not severe, protein malnutrition is most likely greater factor). Gentle hydration and will need to d/w dietary/pharmacy but malnutrition is going to be a persistent issue. Still substantial fluid loss via NG 05/04/23: overnight, increased confusion, temp 100.9-101.1, tachycardic, wound vac drainage increased, transferred back to stepdown, switch cefepime + flagyl to Zosyn, continue vanc, follow BCx, neuro-checks, given lasix, CT head no acute process, CT abd/pel no abscess abdominal and multifocal lung infiltrate, CTA chest to r/o PE / eval pneumonia/effusion --> no PE, (+)pulmonary edema. WIll hold fluids and try diuresis tonight,  hopefully sodium and BP will hold, signed otu to night team in ICU in case needing pressors 05/05/23: stable this morning, sodium slightly better, HH worse, will monitor closely, n oUOP documentd, follow  closely .  05/13/23: Sodium improved, remains on IV Unasyn 009/17/2024: Pt developed septic shock requiring levophed gtt.  CT Abd/Pelvis revealed some contrast  extravasation.  Due to general surgeries concern for possible missed bowel injury or other viscus          injury pt transported to the OR for reopening of laparotomy; excision/explantation of mesh (total);       small bowel resection x 2 including SB anastomosis; creation of end Colostomy Hartmann's to the       right of the abdominal wall; double barrell end proximal ileostomy; repair of colotomy cecum;        placement of abthera wound vac Pt remained mechanically intubated postop PCCM        team reconsulted to assist with management   Interim History / Subjective:  REMAINS ON PRESSORS REMAINS ON VENT SEVERE ABDOMINAL CATASTROPHE DUE TO BOWEL PERF PROLONGED HOSPITAL COURSE VERY POOR CHANCE OF QUALITY OF LIFE  Objective   Blood pressure (!) 122/55, pulse 61, temperature 98.1 F (36.7 C), temperature source Oral, resp. rate (!) 23, height 4\' 11"  (1.499 m), weight 104.1 kg, SpO2 92%.    Vent Mode: PRVC FiO2 (%):  [40 %] 40 % Set Rate:  [24 bmp] 24 bmp Vt Set:  [360 mL] 360 mL PEEP:  [5 cmH20] 5 cmH20 Plateau Pressure:  [22 cmH20-30 cmH20] 22 cmH20   Intake/Output Summary (Last 24 hours) at 05/15/2023 [phone]  to L1 compression fracture. Not a candidate for lumbosacral orthotic. 12/21/2022: Insertion of Port-a-cath 04/06/23: removal of port-a-cath Throughout stay patient has required TPN, WOC & ID consultation. 04/27/2023: 12 hours and surgery for abdominal wall  reconstruction, recurrent incisional hernia repair, partial gastrectomy, small bowel resection, repair enterotomy x 3 and wound VAC placement.  Patient extubated postop but too lethargic to maintain airway requiring reintubation and transferred to ICU for postoperative mechanical ventilatory management. 04/29/23: Pt remains mechanically intubated FiO2 40%/PEEP 5.  Pt awake and with purposeful movement on precedex gtt.  Will perform SBT with plans for possible extubation  04/30/23: hospitalist consulted for hyperglycemia and coagulopathy. Overnight fever to 101.8, tachycardic, WBC WNL but concern for SIRS/sepsis 05/01/23: pro calcitonin (+), starting tx for HCAP. MRSA resp screen recently neg 08/20, will get sputum cx. Echo EF 60-65%, G1DD 05/02/23: hypernatremic, inflammatory markers up (ferritin high, ALP high), still septic/SIRS, escalating abx, repeat CXR, very cautious fluid administration to help reduce sodium and if increased SOB would d/c fluids and repeat CXR, increased insulin, repeat albumin  05/03/23: Tachycardia has improved, still tachypnea but no longer meeting sepsis/sirs criteria. Sodium about same 152, Hgb stable. Relatively low UOsm 444 c/w osmotic diuresis (get Glc controlled) / loop diuretics (d/c Lasix) / ADH resistance (correct potassium though hypokalemia is not severe, protein malnutrition is most likely greater factor). Gentle hydration and will need to d/w dietary/pharmacy but malnutrition is going to be a persistent issue. Still substantial fluid loss via NG 05/04/23: overnight, increased confusion, temp 100.9-101.1, tachycardic, wound vac drainage increased, transferred back to stepdown, switch cefepime + flagyl to Zosyn, continue vanc, follow BCx, neuro-checks, given lasix, CT head no acute process, CT abd/pel no abscess abdominal and multifocal lung infiltrate, CTA chest to r/o PE / eval pneumonia/effusion --> no PE, (+)pulmonary edema. WIll hold fluids and try diuresis tonight,  hopefully sodium and BP will hold, signed otu to night team in ICU in case needing pressors 05/05/23: stable this morning, sodium slightly better, HH worse, will monitor closely, n oUOP documentd, follow  closely .  05/13/23: Sodium improved, remains on IV Unasyn 009/21/2024: Pt developed septic shock requiring levophed gtt.  CT Abd/Pelvis revealed some contrast  extravasation.  Due to general surgeries concern for possible missed bowel injury or other viscus          injury pt transported to the OR for reopening of laparotomy; excision/explantation of mesh (total);       small bowel resection x 2 including SB anastomosis; creation of end Colostomy Hartmann's to the       right of the abdominal wall; double barrell end proximal ileostomy; repair of colotomy cecum;        placement of abthera wound vac Pt remained mechanically intubated postop PCCM        team reconsulted to assist with management   Interim History / Subjective:  REMAINS ON PRESSORS REMAINS ON VENT SEVERE ABDOMINAL CATASTROPHE DUE TO BOWEL PERF PROLONGED HOSPITAL COURSE VERY POOR CHANCE OF QUALITY OF LIFE  Objective   Blood pressure (!) 122/55, pulse 61, temperature 98.1 F (36.7 C), temperature source Oral, resp. rate (!) 23, height 4\' 11"  (1.499 m), weight 104.1 kg, SpO2 92%.    Vent Mode: PRVC FiO2 (%):  [40 %] 40 % Set Rate:  [24 bmp] 24 bmp Vt Set:  [360 mL] 360 mL PEEP:  [5 cmH20] 5 cmH20 Plateau Pressure:  [22 cmH20-30 cmH20] 22 cmH20   Intake/Output Summary (Last 24 hours) at 05/15/2023 [phone]  to L1 compression fracture. Not a candidate for lumbosacral orthotic. 12/21/2022: Insertion of Port-a-cath 04/06/23: removal of port-a-cath Throughout stay patient has required TPN, WOC & ID consultation. 04/27/2023: 12 hours and surgery for abdominal wall  reconstruction, recurrent incisional hernia repair, partial gastrectomy, small bowel resection, repair enterotomy x 3 and wound VAC placement.  Patient extubated postop but too lethargic to maintain airway requiring reintubation and transferred to ICU for postoperative mechanical ventilatory management. 04/29/23: Pt remains mechanically intubated FiO2 40%/PEEP 5.  Pt awake and with purposeful movement on precedex gtt.  Will perform SBT with plans for possible extubation  04/30/23: hospitalist consulted for hyperglycemia and coagulopathy. Overnight fever to 101.8, tachycardic, WBC WNL but concern for SIRS/sepsis 05/01/23: pro calcitonin (+), starting tx for HCAP. MRSA resp screen recently neg 08/20, will get sputum cx. Echo EF 60-65%, G1DD 05/02/23: hypernatremic, inflammatory markers up (ferritin high, ALP high), still septic/SIRS, escalating abx, repeat CXR, very cautious fluid administration to help reduce sodium and if increased SOB would d/c fluids and repeat CXR, increased insulin, repeat albumin  05/03/23: Tachycardia has improved, still tachypnea but no longer meeting sepsis/sirs criteria. Sodium about same 152, Hgb stable. Relatively low UOsm 444 c/w osmotic diuresis (get Glc controlled) / loop diuretics (d/c Lasix) / ADH resistance (correct potassium though hypokalemia is not severe, protein malnutrition is most likely greater factor). Gentle hydration and will need to d/w dietary/pharmacy but malnutrition is going to be a persistent issue. Still substantial fluid loss via NG 05/04/23: overnight, increased confusion, temp 100.9-101.1, tachycardic, wound vac drainage increased, transferred back to stepdown, switch cefepime + flagyl to Zosyn, continue vanc, follow BCx, neuro-checks, given lasix, CT head no acute process, CT abd/pel no abscess abdominal and multifocal lung infiltrate, CTA chest to r/o PE / eval pneumonia/effusion --> no PE, (+)pulmonary edema. WIll hold fluids and try diuresis tonight,  hopefully sodium and BP will hold, signed otu to night team in ICU in case needing pressors 05/05/23: stable this morning, sodium slightly better, HH worse, will monitor closely, n oUOP documentd, follow  closely .  05/13/23: Sodium improved, remains on IV Unasyn 009/21/2024: Pt developed septic shock requiring levophed gtt.  CT Abd/Pelvis revealed some contrast  extravasation.  Due to general surgeries concern for possible missed bowel injury or other viscus          injury pt transported to the OR for reopening of laparotomy; excision/explantation of mesh (total);       small bowel resection x 2 including SB anastomosis; creation of end Colostomy Hartmann's to the       right of the abdominal wall; double barrell end proximal ileostomy; repair of colotomy cecum;        placement of abthera wound vac Pt remained mechanically intubated postop PCCM        team reconsulted to assist with management   Interim History / Subjective:  REMAINS ON PRESSORS REMAINS ON VENT SEVERE ABDOMINAL CATASTROPHE DUE TO BOWEL PERF PROLONGED HOSPITAL COURSE VERY POOR CHANCE OF QUALITY OF LIFE  Objective   Blood pressure (!) 122/55, pulse 61, temperature 98.1 F (36.7 C), temperature source Oral, resp. rate (!) 23, height 4\' 11"  (1.499 m), weight 104.1 kg, SpO2 92%.    Vent Mode: PRVC FiO2 (%):  [40 %] 40 % Set Rate:  [24 bmp] 24 bmp Vt Set:  [360 mL] 360 mL PEEP:  [5 cmH20] 5 cmH20 Plateau Pressure:  [22 cmH20-30 cmH20] 22 cmH20   Intake/Output Summary (Last 24 hours) at 05/15/2023 [phone]  Minutes Intravenous Every 8 hours 05/04/23 0236 05/08/23 0158   05/03/23 1800  vancomycin (VANCOCIN) IVPB 1000 mg/200 mL premix  Status:  Discontinued        1,000 mg 200 mL/hr over 60 Minutes Intravenous Every 24 hours 05/02/23 1530 05/03/23 0823   05/03/23 1800   vancomycin (VANCOREADY) IVPB 1250 mg/250 mL  Status:  Discontinued        1,250 mg 166.7 mL/hr over 90 Minutes Intravenous Every 24 hours 05/03/23 0823 05/03/23 1752   05/03/23 1800  Vancomycin (VANCOCIN) 1,250 mg in sodium chloride 0.9 % 250 mL IVPB  Status:  Discontinued        1,250 mg 166.7 mL/hr over 90 Minutes Intravenous Every 24 hours 05/03/23 1752 05/04/23 0755   05/02/23 2000  ceFEPIme (MAXIPIME) 2 g in sodium chloride 0.9 % 100 mL IVPB  Status:  Discontinued        2 g 200 mL/hr over 30 Minutes Intravenous Every 8 hours 05/02/23 1530 05/04/23 0236   05/02/23 1630  vancomycin (VANCOREADY) IVPB 2000 mg/400 mL        2,000 mg 200 mL/hr over 120 Minutes Intravenous  Once 05/02/23 1530 05/02/23 1746   05/02/23 1600  metroNIDAZOLE (FLAGYL) IVPB 500 mg  Status:  Discontinued        500 mg 100 mL/hr over 60 Minutes Intravenous Every 12 hours 05/02/23 1453 05/04/23 0236   05/01/23 1400  azithromycin (ZITHROMAX) 500 mg in sodium chloride 0.9 % 250 mL IVPB  Status:  Discontinued        500 mg 250 mL/hr over 60 Minutes Intravenous Every 24 hours 05/01/23 1255 05/02/23 1453   05/01/23 1230  azithromycin (ZITHROMAX) tablet 500 mg  Status:  Discontinued        500 mg Oral Daily 05/01/23 1135 05/01/23 1255   05/01/23 1200  cefTRIAXone (ROCEPHIN) 2 g in sodium chloride 0.9 % 100 mL IVPB  Status:  Discontinued        2 g 200 mL/hr over 30 Minutes Intravenous Every 24 hours 05/01/23 1135 05/02/23 1453   04/11/2023 2200  cefoTEtan (CEFOTAN) 2 g in sodium chloride 0.9 % 100 mL IVPB        2 g 200 mL/hr over 30 Minutes Intravenous Every 8 hours 04/12/2023 2058 04/29/23 1717   04/27/23 0945  cefoTEtan (CEFOTAN) 2 g in sodium chloride 0.9 % 100 mL IVPB        2 g 200 mL/hr over 30 Minutes Intravenous On call to O.R. 04/27/23 0856 05/05/2023 0559   04/05/23 0900  cefTRIAXone (ROCEPHIN) 2 g in sodium chloride 0.9 % 100 mL IVPB        2 g 200 mL/hr over 30 Minutes Intravenous Every 24 hours 04/05/23 0634  04/14/23 1254   01/05/2023 1000  ceFAZolin (ANCEF) IVPB 2g/100 mL premix        2 g 200 mL/hr over 30 Minutes Intravenous  Once 12/18/22 1738 12/21/2022 1141   09/17/22 1200  vancomycin (VANCOREADY) IVPB 1250 mg/250 mL  Status:  Discontinued        1,250 mg 166.7 mL/hr over 90 Minutes Intravenous Every 24 hours 09/16/22 1040 09/22/22 1139   09/16/22 1200  vancomycin (VANCOREADY) IVPB 1250 mg/250 mL        1,250 mg 166.7 mL/hr over 90 Minutes Intravenous  Once 09/15/22 1706 09/16/22 1255   09/15/22 1200  ceFEPIme (MAXIPIME) 2 g in sodium chloride 0.9 % 100 mL IVPB  Status:  Discontinued  Minutes Intravenous Every 8 hours 05/04/23 0236 05/08/23 0158   05/03/23 1800  vancomycin (VANCOCIN) IVPB 1000 mg/200 mL premix  Status:  Discontinued        1,000 mg 200 mL/hr over 60 Minutes Intravenous Every 24 hours 05/02/23 1530 05/03/23 0823   05/03/23 1800   vancomycin (VANCOREADY) IVPB 1250 mg/250 mL  Status:  Discontinued        1,250 mg 166.7 mL/hr over 90 Minutes Intravenous Every 24 hours 05/03/23 0823 05/03/23 1752   05/03/23 1800  Vancomycin (VANCOCIN) 1,250 mg in sodium chloride 0.9 % 250 mL IVPB  Status:  Discontinued        1,250 mg 166.7 mL/hr over 90 Minutes Intravenous Every 24 hours 05/03/23 1752 05/04/23 0755   05/02/23 2000  ceFEPIme (MAXIPIME) 2 g in sodium chloride 0.9 % 100 mL IVPB  Status:  Discontinued        2 g 200 mL/hr over 30 Minutes Intravenous Every 8 hours 05/02/23 1530 05/04/23 0236   05/02/23 1630  vancomycin (VANCOREADY) IVPB 2000 mg/400 mL        2,000 mg 200 mL/hr over 120 Minutes Intravenous  Once 05/02/23 1530 05/02/23 1746   05/02/23 1600  metroNIDAZOLE (FLAGYL) IVPB 500 mg  Status:  Discontinued        500 mg 100 mL/hr over 60 Minutes Intravenous Every 12 hours 05/02/23 1453 05/04/23 0236   05/01/23 1400  azithromycin (ZITHROMAX) 500 mg in sodium chloride 0.9 % 250 mL IVPB  Status:  Discontinued        500 mg 250 mL/hr over 60 Minutes Intravenous Every 24 hours 05/01/23 1255 05/02/23 1453   05/01/23 1230  azithromycin (ZITHROMAX) tablet 500 mg  Status:  Discontinued        500 mg Oral Daily 05/01/23 1135 05/01/23 1255   05/01/23 1200  cefTRIAXone (ROCEPHIN) 2 g in sodium chloride 0.9 % 100 mL IVPB  Status:  Discontinued        2 g 200 mL/hr over 30 Minutes Intravenous Every 24 hours 05/01/23 1135 05/02/23 1453   04/11/2023 2200  cefoTEtan (CEFOTAN) 2 g in sodium chloride 0.9 % 100 mL IVPB        2 g 200 mL/hr over 30 Minutes Intravenous Every 8 hours 04/12/2023 2058 04/29/23 1717   04/27/23 0945  cefoTEtan (CEFOTAN) 2 g in sodium chloride 0.9 % 100 mL IVPB        2 g 200 mL/hr over 30 Minutes Intravenous On call to O.R. 04/27/23 0856 05/05/2023 0559   04/05/23 0900  cefTRIAXone (ROCEPHIN) 2 g in sodium chloride 0.9 % 100 mL IVPB        2 g 200 mL/hr over 30 Minutes Intravenous Every 24 hours 04/05/23 0634  04/14/23 1254   01/05/2023 1000  ceFAZolin (ANCEF) IVPB 2g/100 mL premix        2 g 200 mL/hr over 30 Minutes Intravenous  Once 12/18/22 1738 12/21/2022 1141   09/17/22 1200  vancomycin (VANCOREADY) IVPB 1250 mg/250 mL  Status:  Discontinued        1,250 mg 166.7 mL/hr over 90 Minutes Intravenous Every 24 hours 09/16/22 1040 09/22/22 1139   09/16/22 1200  vancomycin (VANCOREADY) IVPB 1250 mg/250 mL        1,250 mg 166.7 mL/hr over 90 Minutes Intravenous  Once 09/15/22 1706 09/16/22 1255   09/15/22 1200  ceFEPIme (MAXIPIME) 2 g in sodium chloride 0.9 % 100 mL IVPB  Status:  Discontinued  to L1 compression fracture. Not a candidate for lumbosacral orthotic. 12/21/2022: Insertion of Port-a-cath 04/06/23: removal of port-a-cath Throughout stay patient has required TPN, WOC & ID consultation. 04/27/2023: 12 hours and surgery for abdominal wall  reconstruction, recurrent incisional hernia repair, partial gastrectomy, small bowel resection, repair enterotomy x 3 and wound VAC placement.  Patient extubated postop but too lethargic to maintain airway requiring reintubation and transferred to ICU for postoperative mechanical ventilatory management. 04/29/23: Pt remains mechanically intubated FiO2 40%/PEEP 5.  Pt awake and with purposeful movement on precedex gtt.  Will perform SBT with plans for possible extubation  04/30/23: hospitalist consulted for hyperglycemia and coagulopathy. Overnight fever to 101.8, tachycardic, WBC WNL but concern for SIRS/sepsis 05/01/23: pro calcitonin (+), starting tx for HCAP. MRSA resp screen recently neg 08/20, will get sputum cx. Echo EF 60-65%, G1DD 05/02/23: hypernatremic, inflammatory markers up (ferritin high, ALP high), still septic/SIRS, escalating abx, repeat CXR, very cautious fluid administration to help reduce sodium and if increased SOB would d/c fluids and repeat CXR, increased insulin, repeat albumin  05/03/23: Tachycardia has improved, still tachypnea but no longer meeting sepsis/sirs criteria. Sodium about same 152, Hgb stable. Relatively low UOsm 444 c/w osmotic diuresis (get Glc controlled) / loop diuretics (d/c Lasix) / ADH resistance (correct potassium though hypokalemia is not severe, protein malnutrition is most likely greater factor). Gentle hydration and will need to d/w dietary/pharmacy but malnutrition is going to be a persistent issue. Still substantial fluid loss via NG 05/04/23: overnight, increased confusion, temp 100.9-101.1, tachycardic, wound vac drainage increased, transferred back to stepdown, switch cefepime + flagyl to Zosyn, continue vanc, follow BCx, neuro-checks, given lasix, CT head no acute process, CT abd/pel no abscess abdominal and multifocal lung infiltrate, CTA chest to r/o PE / eval pneumonia/effusion --> no PE, (+)pulmonary edema. WIll hold fluids and try diuresis tonight,  hopefully sodium and BP will hold, signed otu to night team in ICU in case needing pressors 05/05/23: stable this morning, sodium slightly better, HH worse, will monitor closely, n oUOP documentd, follow  closely .  05/13/23: Sodium improved, remains on IV Unasyn 009/21/2024: Pt developed septic shock requiring levophed gtt.  CT Abd/Pelvis revealed some contrast  extravasation.  Due to general surgeries concern for possible missed bowel injury or other viscus          injury pt transported to the OR for reopening of laparotomy; excision/explantation of mesh (total);       small bowel resection x 2 including SB anastomosis; creation of end Colostomy Hartmann's to the       right of the abdominal wall; double barrell end proximal ileostomy; repair of colotomy cecum;        placement of abthera wound vac Pt remained mechanically intubated postop PCCM        team reconsulted to assist with management   Interim History / Subjective:  REMAINS ON PRESSORS REMAINS ON VENT SEVERE ABDOMINAL CATASTROPHE DUE TO BOWEL PERF PROLONGED HOSPITAL COURSE VERY POOR CHANCE OF QUALITY OF LIFE  Objective   Blood pressure (!) 122/55, pulse 61, temperature 98.1 F (36.7 C), temperature source Oral, resp. rate (!) 23, height 4\' 11"  (1.499 m), weight 104.1 kg, SpO2 92%.    Vent Mode: PRVC FiO2 (%):  [40 %] 40 % Set Rate:  [24 bmp] 24 bmp Vt Set:  [360 mL] 360 mL PEEP:  [5 cmH20] 5 cmH20 Plateau Pressure:  [22 cmH20-30 cmH20] 22 cmH20   Intake/Output Summary (Last 24 hours) at 05/15/2023 [phone]  to L1 compression fracture. Not a candidate for lumbosacral orthotic. 12/21/2022: Insertion of Port-a-cath 04/06/23: removal of port-a-cath Throughout stay patient has required TPN, WOC & ID consultation. 04/27/2023: 12 hours and surgery for abdominal wall  reconstruction, recurrent incisional hernia repair, partial gastrectomy, small bowel resection, repair enterotomy x 3 and wound VAC placement.  Patient extubated postop but too lethargic to maintain airway requiring reintubation and transferred to ICU for postoperative mechanical ventilatory management. 04/29/23: Pt remains mechanically intubated FiO2 40%/PEEP 5.  Pt awake and with purposeful movement on precedex gtt.  Will perform SBT with plans for possible extubation  04/30/23: hospitalist consulted for hyperglycemia and coagulopathy. Overnight fever to 101.8, tachycardic, WBC WNL but concern for SIRS/sepsis 05/01/23: pro calcitonin (+), starting tx for HCAP. MRSA resp screen recently neg 08/20, will get sputum cx. Echo EF 60-65%, G1DD 05/02/23: hypernatremic, inflammatory markers up (ferritin high, ALP high), still septic/SIRS, escalating abx, repeat CXR, very cautious fluid administration to help reduce sodium and if increased SOB would d/c fluids and repeat CXR, increased insulin, repeat albumin  05/03/23: Tachycardia has improved, still tachypnea but no longer meeting sepsis/sirs criteria. Sodium about same 152, Hgb stable. Relatively low UOsm 444 c/w osmotic diuresis (get Glc controlled) / loop diuretics (d/c Lasix) / ADH resistance (correct potassium though hypokalemia is not severe, protein malnutrition is most likely greater factor). Gentle hydration and will need to d/w dietary/pharmacy but malnutrition is going to be a persistent issue. Still substantial fluid loss via NG 05/04/23: overnight, increased confusion, temp 100.9-101.1, tachycardic, wound vac drainage increased, transferred back to stepdown, switch cefepime + flagyl to Zosyn, continue vanc, follow BCx, neuro-checks, given lasix, CT head no acute process, CT abd/pel no abscess abdominal and multifocal lung infiltrate, CTA chest to r/o PE / eval pneumonia/effusion --> no PE, (+)pulmonary edema. WIll hold fluids and try diuresis tonight,  hopefully sodium and BP will hold, signed otu to night team in ICU in case needing pressors 05/05/23: stable this morning, sodium slightly better, HH worse, will monitor closely, n oUOP documentd, follow  closely .  05/13/23: Sodium improved, remains on IV Unasyn 009/21/2024: Pt developed septic shock requiring levophed gtt.  CT Abd/Pelvis revealed some contrast  extravasation.  Due to general surgeries concern for possible missed bowel injury or other viscus          injury pt transported to the OR for reopening of laparotomy; excision/explantation of mesh (total);       small bowel resection x 2 including SB anastomosis; creation of end Colostomy Hartmann's to the       right of the abdominal wall; double barrell end proximal ileostomy; repair of colotomy cecum;        placement of abthera wound vac Pt remained mechanically intubated postop PCCM        team reconsulted to assist with management   Interim History / Subjective:  REMAINS ON PRESSORS REMAINS ON VENT SEVERE ABDOMINAL CATASTROPHE DUE TO BOWEL PERF PROLONGED HOSPITAL COURSE VERY POOR CHANCE OF QUALITY OF LIFE  Objective   Blood pressure (!) 122/55, pulse 61, temperature 98.1 F (36.7 C), temperature source Oral, resp. rate (!) 23, height 4\' 11"  (1.499 m), weight 104.1 kg, SpO2 92%.    Vent Mode: PRVC FiO2 (%):  [40 %] 40 % Set Rate:  [24 bmp] 24 bmp Vt Set:  [360 mL] 360 mL PEEP:  [5 cmH20] 5 cmH20 Plateau Pressure:  [22 cmH20-30 cmH20] 22 cmH20   Intake/Output Summary (Last 24 hours) at 05/15/2023 [phone]  [facility] ORS;  Service: General;  Laterality: N/A;   VENTRAL HERNIA REPAIR N/A 02/27/2022   Procedure: HERNIA REPAIR VENTRAL ADULT, open, [name], PA-C to assist;  Surgeon: Leafy Ro, MD;  Location: [facility] ORS;  Service: General;  Laterality: N/A;      DVT/GI PRX  assessed I  Assessed the need for Labs I Assessed the need for Foley I Assessed the need for Central Venous Line Family Discussion when available I Assessed the need for Mobilization I made an Assessment of medications to be adjusted accordingly Safety Risk assessment completed  CASE DISCUSSED IN MULTIDISCIPLINARY ROUNDS WITH ICU TEAM     Critical Care Time devoted to patient care services described in this note is 65 minutes.  Critical care was necessary to treat /prevent imminent and life-threatening deterioration. Overall, patient is critically ill, prognosis is guarded.  Patient with Multiorgan failure and at high risk for cardiac arrest and death.    Lucie Leather, M.D.  [facility]  Medical Director [facility] Medical Director [facility] Cardio-Pulmonary Department

## 2023-05-15 NOTE — Procedures (Signed)
Central Venous Catheter Insertion Procedure Note  Wanell Newville  [id number]  1966-02-08  Date:05/15/23  Time:5:22 AM   Provider Performing:Aowyn Rozeboom L Rust[place]ster   Procedure: Insertion of Non-tunneled Central Venous Catheter(36556) with US guidance (40981)   Indication(s) Medication administration and Difficult access  Consent Unable to obtain consent due to emergent nature of procedure.  Anesthesia Topical only with 1% lidocaine with propofol & fentanyl drips in place  Timeout Verified patient identification, verified procedure, site/side was marked, verified correct patient position, special equipment/implants available, medications/allergies/relevant history reviewed, required imaging and test results available.  Sterile Technique Maximal sterile technique including full sterile barrier drape, hand hygiene, sterile gown, sterile gloves, mask, hair covering, sterile ultrasound probe cover (if used).  Procedure Description Area of catheter insertion was cleaned with chlorhexidine and draped in sterile fashion.  With real-time ultrasound guidance a central venous catheter was placed into the right femoral vein. Nonpulsatile blood flow and easy flushing noted in all ports.  The catheter was sutured in place and sterile dressing applied.  Complications/Tolerance None; patient tolerated the procedure well. Chest X-ray is ordered to verify placement for internal jugular or subclavian cannulation.   Chest x-ray is not ordered for femoral cannulation.  EBL Minimal  Specimen(s) None   Betsey Holiday, AGACNP-BC Acute Care Nurse Practitioner [facility]   [phone] / [phone] Please see Amion for details.

## 2023-05-15 NOTE — Anesthesia Postprocedure Evaluation (Signed)
Anesthesia Post Note  Patient: Montzerrat Skwarek  Procedure(s) Performed: EXPLORATORY LAPAROTOMY COLOSTOMY ILEOSTOMY SMALL BOWEL RESECTION APPLICATION OF WOUND VAC EXCISION OF MESH  Patient location during evaluation: SICU Anesthesia Type: General Level of consciousness: sedated Pain management: pain level controlled Vital Signs Assessment: post-procedure vital signs reviewed and stable Respiratory status: patient remains intubated per anesthesia plan Cardiovascular status: stable (Pt continues to require BP support) Postop Assessment: no apparent nausea or vomiting Anesthetic complications: no   No notable events documented.   Last Vitals:  Vitals:   05/15/23 0630 05/15/23 0645  BP:    Pulse: 62 61  Resp: (!) 21 (!) 23  Temp:    SpO2: 93% 92%    Last Pain:  Vitals:   05/15/23 0500  TempSrc: Oral  PainSc:                  Rosanne Gutting

## 2023-05-15 NOTE — Progress Notes (Signed)
PHARMACY - TOTAL PARENTERAL NUTRITION CONSULT NOTE   Indication: Prolonged ileus  Patient Measurements: Height: 4\' 11"  (149.9 cm) Weight: 104.1 kg (229 lb 8 oz) IBW/kg (Calculated) : 43.2 TPN AdjBW (KG): 59 Body mass index is 46.35 kg/m.  Assessment: Debra [name] is a 57 y.o. female s/p laparotomy, excision of greater omental mass, abdominal wall reconstruction with [name], appendectomy, and placement of Prevena vac.  Glucose / Insulin:  BG last 24h: 196 - 469 SSI last 24h: now on insulin gtt  Electrolytes: electrolytes WNL Renal: SCr <1, fairly stable Hepatic: AST/ALT elevated but trending down I&O:  GI Imaging: 9/11 CTAP: no new acute issues GI Surgeries / Procedures:  02/14/2022: S/p laparotomy, excision of greater omental mass, abdominal wall reconstruction with [name], appendectomy, and placement of Prevena vac 12/30/2022: Placement of right internal jugular port-a-catheter with dual reservoirs also s/p re-opening of laparotomy for repair of small bowel perforation  04/23/2023:  Exploratory laparotomy, extensive lysis of adhesion, 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, partial gastrectomy, abdominal wall reconstruction with mesh placement, and wound vac placement  05/14/23  takeback exploratory laparotomy, excision of mesh, small bowel resection, creation of end colostomy/mucus fistula, double barrel ileostomy   Central access: PICC 04/29/2023   TPN start date: 04/27/2023 TPN from 03/07/2022 to 04/05/2023 - held for bacteremia and TPN restarted 04/12/2023 Previously Cyclic TPN, transitioned back to regular TPN on 04/29/23 post surgery  RD Assessment:  Estimated Needs Total Energy Estimated Needs: 1900-2200kcal/day Total Protein Estimated Needs: 95-110g/day Total Fluid Estimated Needs: 1.4-1.6L/day  Current Nutrition: NPO  Plan:  --Continue TPN at new goal rate of 65 mL/hr --Electrolytes in TPN: Na 10 mEq/L, K 35mEq/L, Ca  45mEq/L, Mg 5 mEq/L, and Phos 6mmol/L(increased on 8/31) --Acetate maximized  --Continue multivitamin, trace elements, zinc, selenium and chromium chloride to TPN  --Monitor TPN labs daily until stable then on Mon/Thurs  Debra [name] 05/15/2023 7:20 AM

## 2023-05-15 NOTE — Progress Notes (Addendum)
Nutrition Follow-up  DOCUMENTATION CODES:   Obesity unspecified  INTERVENTION:   Continue TPN per pharmacy (24hr)- provides 1794kcal/day and 100g/day protein   Continue zinc additional 10mg  daily in TPN   Continue selenium additional daily in TPN  Resume ergocalciferol 50,000 units po weekly once appropriate   Resume vitamin A 30,000 units po daily once appropriate   Daily weights   NUTRITION DIAGNOSIS:   Increased nutrient needs related to wound healing, catabolic illness as evidenced by estimated needs. -ongoing   GOAL:   Provide needs based on ASPEN/SCCM guidelines -met with TPN  MONITOR:   Vent status, Labs, Weight trends, Skin, I & O's, Other (Comment) (TPN)  ASSESSMENT:   57 y/o female with h/o hypothyroidism, COVID 19 (3/21), kidney stones and stage 3 colon cancer (adenocarcinoma s/p left hemicolectomy 01/26/22 and chemotherapy) who was admitted for new pelvic mass s/p laparotomy 02/17/2022 (with excision of pelvic mass from greater omentum, abdominal wall reconstruction with bilateral myocutaneous flaps and mesh, incisional hernia repair, appendectomy repair and VAC placement) complicated by bowel perforation s/p reopening of recent laparotomy February 18, 2022 (with repair of small bowel perforation, excision of mesh, placement of two phasix mesh and VAC placement) complicated by EC fistula requiring chronic TPN, new L1 compression fracture and grade 1 diastolic dysfunction with preserved LVEF and now s/p laparotomy 04/22/2023 (with abdominal wall reconstruction with bilateral myocutaneous flaps, recurrent incisional hernia repair, partial distal  gastrectomy, small bowel resection (~80 cm of distal jejunum including three EC fistulas), extensive LOA, repair of enterotomies x 3, VAC placement, excisional debridement of abdominal wall skin and excision of old mesh) complicated by HCAP, AKI, volume overload and sepsis. On 9/5 pt developed septic shock with CT abd/pelvis revealing  contrast extravasation. Pt now s/p reopening of laparotomy, excision/explantation  of mesh, small bowel resection x 2 including SB anastomosis, creation of end colostomy Hartmann's to the right of the abdominal wall,  double barrel proximal ileostomy, repair of colotomy cecum and placement of abthera wound vac 9/5.   Pt returned to the OR yesterday. Pt remains sedated and ventilated with plans to return to the OR for a washout on Sunday. NGT in place to LIS with tip noted gastric. Pt continues to tolerate TPN well at goal rate; will decrease TPN to meet the lower end of pt's estimated needs to prevent overfeeding while pt is ventilated. Electrolytes, triglycerides and blood glucoses being monitored. Pt is requiring insulin drip. Vitamin labs being monitored and repleted as needed. Pt remains volume overloaded; last weight taken on 9/4. Pt is requiring pressors today. No bowel function yet. Pt did have more small bowel resected but RD is unsure how much.   Medications reviewed and include: colace, heparin, synthroid, imodium, octreotide, protonix, miralax, unasyn, NaCl @150ml /hr, albumin, diflucan, insulin, levophed, zosyn, propofol, TPN  -Selenium- 59.0(L), zinc- 46 wnl, vitamin D 35.99, vitamin A- 12.5(L)- 8/23 -Iodine- 49.8 wnl, chromium- 2.3(H), copper- 93 wnl, manganese- 19.0 wnl- 7/22 -Vitamin B1- 70.3 wnl, B6- 16.4 wnl, vitamin C- 0.4 wnl, vitamin E- 11.8 wnl, vitamin K 1.43 wnl- 7/22 -folate 14.8 wnl, B12- 870 wnl- 7/22 -carnitine 32.0 wnl- 4/12 -aluminum 10(H)- 4/12 --Iron 16(L), TIBC 172, ferritin 86- 8/22  Labs reviewed: K 3.5 wnl, BUN 29(H) P 3.6 wnl, Mg 2.2 wnl- 9/2 Triglycerides- 147- 8/26 Hgb 7.8(L), Hct 21.1(L) Cbgs- 163, 177, 188 hrs  Patient is currently intubated on ventilator support MV: 9.2 L/min Temp (24hrs), Avg:97.8 F (36.6 C), Min:97.6 F (36.4 C), Max:98.1 F (36.7 C)  Propofol:  21.9 ml/hr- provides 578kcal/day- plan is to wean today  MAP- >67mmHg   UOP-    Surgical Drains             - LLQ; 12 ccs; more serous now             - RLQ: 20 ccs; More serous now Abthera vac present; fluid is serosanguinous Ostomies bilaterally; no output   NGT output- 35ml   Diet Order:    Diet Order             Diet NPO time specified  Diet effective now                  EDUCATION NEEDS:   Not appropriate for education at this time  Skin:  Skin Assessment: Reviewed RN Assessment- open abdomen, VAC   Last BM:  9/4- type 7  Height:   Ht Readings from Last 1 Encounters:  05/05/2023 4\' 11"  (1.499 m)    Weight:   Wt Readings from Last 1 Encounters:  05/13/23 104.1 kg    Ideal Body Weight:  44.3 kg  BMI:  Body mass index is 46.35 kg/m.  Estimated Nutritional Needs:   Kcal:  1900-2200kcal/day  Protein:  95-110g/day  Fluid:  1.4-1.6L/day  Betsey Holiday MS, RD, LDN Please refer to [facility] for RD and/or RD on-call/weekend/after hours pager

## 2023-05-15 NOTE — Progress Notes (Signed)
[facility] SURGICAL PROGRESS NOTE  Hospital Day(s): 456.   Post op day(s): 1 Day Post-Op.   Interval History:  Patient seen and examined Afebrile overnight BP improved this AM; 18 mcg/min Levophed and 0.03 units vasopressin Patient intubated and sedated  Remains without leukocytosis; WBC 6.8K Hgb to 7.8 (from 7.9) Lactic acid to 4.0 (from 3.9) Renal function normal; sCr - 0.62; UO - 3930 cs INR 1.7 No electrolyte derangements Surgical Drains  - LLQ; 12 ccs; more serous now  - RLQ: 20 ccs; More serous now Abthera vac present; fluid is serosanguinous Ostomies bilaterally; no output    Vital signs in last 24 hours: [min-max] current  Temp:  [97.6 F (36.4 C)-98.1 F (36.7 C)] 98.1 F (36.7 C) (09/06 0500) Pulse Rate:  [58-80] 64 (09/06 0815) Resp:  [5-27] 20 (09/06 0815) BP: (70-127)/(38-69) 122/55 (09/06 0443) SpO2:  [Debra %-99 %] 92 % (09/06 0815) Arterial Line BP: (92-179)/(43-70) 123/54 (09/06 0815) FiO2 (%):  [40 %] 40 % (09/06 0746)     Height: 4\' 11"  (149.9 cm) Weight: 104.1 kg BMI (Calculated): 46.33   Intake/Output last 2 shifts:  May 25, 2023 0701 - 09/06 0700 In: 9136 [I.V.:4927.7; PPIRJ:1884; IV Piggyback:2539.3] Out: 5962 [Urine:3930; Drains:1032; Blood:500]   Physical Exam:  Constitutional: intubated; sedated HEENT: NGT in place Respiratory: intubated; on ventilator Cardiovascular: regular rate and sinus rhythm  Gastrointestinal: soft, unable to assess tenderness. Drains in LLQ and RLQ; output more serosanguinous this AM. Double barrel ileostomy in right abdomen; dusky; no output. End colostomy/mucus fistula in left abdomen; no output  Genitourinary: Foley in place; good UO Integumentary: Abdomen open with abthera; good seal; output serosanguinous   Labs:     Latest Ref Rng & Units 05/15/2023    1:19 AM 2023-05-25    7:02 PM May 25, 2023    4:44 PM  CBC  WBC 4.0 - 10.5 K/uL 6.8  8.6  12.7   Hemoglobin 12.0 - 15.0 g/dL 7.8  7.9  9.8   Hematocrit  36.0 - 46.0 % 21.1  23.1  29.9   Platelets 150 - 400 K/uL 40  60  121       Latest Ref Rng & Units 05/15/2023    5:29 AM 25-May-2023    7:02 PM 05/25/23    4:44 PM  CMP  Glucose 70 - 99 mg/dL 166  063  016   BUN 6 - 20 mg/dL 29  31  33   Creatinine 0.44 - 1.00 mg/dL 0.10  9.32  3.55   Sodium 135 - 145 mmol/L 136  125  128   Potassium 3.5 - 5.1 mmol/L 3.5  3.4  3.2   Chloride 98 - 111 mmol/L 104  95  99   CO2 22 - 32 mmol/L 21  20  19    Calcium 8.9 - 10.3 mg/dL 6.9  6.5  7.1      Imaging studies: No new pertinent imaging studies   Assessment/Plan: 57 y.o. [name] 1 Day Post-Op s/p takeback exploratory laparotomy, excision of mesh, small bowel resection, creation of end colostomy/mucus fistula, double barrel ileostomy   - Appreciate PCCM support; keep intubated; given plan for return to OR  - Titrate vasopressor support  - Plan for return to the OR on Sunday 09/08 with Dr Everlene Farrier  - Keep IV Abx  - Keep NGT; LIS; monitor and record output  - Continue TPN; at goal; monitor electrolytes  - Continue abthera vac; monitor and record output  - Continue surgical drains x2; monitor  and record output  - Continue foley catheter; monitor UO   - Morning labs   With the help of Spanish-English interpretor, all of the above findings and recommendations were discussed with the patient's family, and the medical team, and all of patient's family's questions were answered to their expressed satisfaction.  -- [name], PA-C [facility] 05/15/2023, 8:34 AM M-F: 7am - 4pm

## 2023-05-15 NOTE — Progress Notes (Signed)
Chart reviewed.  Case d/w ICU team.  Patient remains intubated and sedated after return to OR on 9/5 with general surgery.  ICU team to assume consultation services at this time.    [facility] hospitalist service to sign off, but will remain available for any medical issues that arise.  Please contact our service when patient stable for transfer back to gen med/surg.  Lolita Patella MD No charge

## 2023-05-16 LAB — TYPE AND SCREEN
ABO/RH(D): O POS
Antibody Screen: NEGATIVE
Unit division: 0
Unit division: 0
Unit division: 0

## 2023-05-16 LAB — BASIC METABOLIC PANEL
Anion gap: 9 (ref 5–15)
BUN: 31 mg/dL — ABNORMAL HIGH (ref 6–20)
CO2: 24 mmol/L (ref 22–32)
Calcium: 6.8 mg/dL — ABNORMAL LOW (ref 8.9–10.3)
Chloride: 105 mmol/L (ref 98–111)
Creatinine, Ser: 0.65 mg/dL (ref 0.44–1.00)
GFR, Estimated: >60 mL/min (ref >60)
Glucose, Bld: 163 mg/dL — ABNORMAL HIGH (ref 70–99)
Potassium: 3.8 mmol/L (ref 3.5–5.1)
Sodium: 138 mmol/L (ref 135–145)

## 2023-05-16 LAB — PREPARE PLATELET PHERESIS
Unit division: 0
Unit division: 0

## 2023-05-16 LAB — MAGNESIUM: Magnesium: 1.3 mg/dL — ABNORMAL LOW (ref 1.7–2.4)

## 2023-05-16 LAB — BPAM RBC
Blood Product Expiration Date: 202409252359
Blood Product Expiration Date: 202409252359
Blood Product Expiration Date: 202409272359
ISSUE DATE / TIME: 202409051240
ISSUE DATE / TIME: 202409051240
ISSUE DATE / TIME: 202409062134
Unit Type and Rh: 5100
Unit Type and Rh: 5100
Unit Type and Rh: 5100

## 2023-05-16 LAB — CBC
HCT: 24.3 % — ABNORMAL LOW (ref 36.0–46.0)
Hemoglobin: 8.5 g/dL — ABNORMAL LOW (ref 12.0–15.0)
MCH: 30.9 pg (ref 26.0–34.0)
MCHC: 35 g/dL (ref 30.0–36.0)
MCV: 88.4 fL (ref 80.0–100.0)
Platelets: 38 10*3/uL — ABNORMAL LOW (ref 150–400)
RBC: 2.75 MIL/uL — ABNORMAL LOW (ref 3.87–5.11)
RDW: 18.3 % — ABNORMAL HIGH (ref 11.5–15.5)
WBC: 14.7 10*3/uL — ABNORMAL HIGH (ref 4.0–10.5)
nRBC: 1.6 % — ABNORMAL HIGH (ref 0.0–0.2)

## 2023-05-16 LAB — PHOSPHORUS: Phosphorus: 4.9 mg/dL — ABNORMAL HIGH (ref 2.5–4.6)

## 2023-05-16 LAB — PREPARE FRESH FROZEN PLASMA: Unit division: 0

## 2023-05-16 LAB — HEMOGLOBIN AND HEMATOCRIT, BLOOD
HCT: 24.5 % — ABNORMAL LOW (ref 36.0–46.0)
Hemoglobin: 8.6 g/dL — ABNORMAL LOW (ref 12.0–15.0)

## 2023-05-16 LAB — PROTIME-INR
INR: 1.7 — ABNORMAL HIGH (ref 0.8–1.2)
Prothrombin Time: 19.8 s — ABNORMAL HIGH (ref 11.4–15.2)

## 2023-05-16 LAB — BPAM FFP
Blood Product Expiration Date: 202409102359
Blood Product Expiration Date: 202409102359
ISSUE DATE / TIME: 202409060039
ISSUE DATE / TIME: 202409061554
Unit Type and Rh: 6200
Unit Type and Rh: 8400

## 2023-05-16 LAB — BPAM PLATELET PHERESIS
Blood Product Expiration Date: 202409082359
Blood Product Expiration Date: 202409082359
ISSUE DATE / TIME: 202409060426
Unit Type and Rh: 5100
Unit Type and Rh: 6200

## 2023-05-16 LAB — GLUCOSE, CAPILLARY
Glucose-Capillary: 159 mg/dL — ABNORMAL HIGH (ref 70–99)
Glucose-Capillary: 182 mg/dL — ABNORMAL HIGH (ref 70–99)
Glucose-Capillary: 169 mg/dL — ABNORMAL HIGH (ref 70–99)
Glucose-Capillary: 166 mg/dL — ABNORMAL HIGH (ref 70–99)
Glucose-Capillary: 179 mg/dL — ABNORMAL HIGH (ref 70–99)

## 2023-05-16 LAB — TRIGLYCERIDES: Triglycerides: 381 mg/dL — ABNORMAL HIGH (ref <150)

## 2023-05-16 MED ORDER — ZINC CHLORIDE 1 MG/ML IV SOLN
INTRAVENOUS | Status: AC
  Filled 2023-05-16: qty 665.6

## 2023-05-16 MED ORDER — LEVOTHYROXINE SODIUM 100 MCG/5ML IV SOLN
75.0000 ug | Freq: Every day | INTRAVENOUS | Status: DC
  Administered 2023-05-16 – 2023-05-17 (×2): 75 ug via INTRAVENOUS
  Filled 2023-05-16 (×3): qty 5

## 2023-05-16 MED ORDER — VECURONIUM BROMIDE 10 MG IV SOLR
10.0000 mg | INTRAVENOUS | Status: DC | PRN
  Administered 2023-05-16 – 2023-05-17 (×3): 10 mg via INTRAVENOUS
  Filled 2023-05-16 (×3): qty 10

## 2023-05-16 MED ORDER — LEVOTHYROXINE SODIUM 100 MCG/5ML IV SOLN: 75.0000 ug | Freq: Every day | INTRAVENOUS | Status: DC

## 2023-05-16 MED ORDER — MAGNESIUM SULFATE 4 GM/100ML IV SOLN
4.0000 g | Freq: Once | INTRAVENOUS | Status: AC
  Administered 2023-05-16: 4 g via INTRAVENOUS
  Filled 2023-05-16: qty 100

## 2023-05-16 NOTE — Progress Notes (Addendum)
PHARMACY - TOTAL PARENTERAL NUTRITION CONSULT NOTE   Indication: Prolonged ileus  Patient Measurements: Height: 4\' 11"  (149.9 cm) Weight: 104.1 kg (229 lb 8 oz) IBW/kg (Calculated) : 43.2 TPN AdjBW (KG): 59 Body mass index is 46.35 kg/m.  Assessment: Debra [name] is a 57 y.o. female s/p laparotomy, excision of greater omental mass, abdominal wall reconstruction with [name], appendectomy, and placement of Prevena vac.  Glucose / Insulin:  BG last 24h: 159-217 SSI last 24h: now on insulin gtt  Electrolytes: electrolytes WNL. Phos 4.9, Mg 1.3,  Renal: SCr <1, fairly stable Hepatic: AST/ALT elevated but trending down I&O:  GI Imaging: 9/11 CTAP: no new acute issues GI Surgeries / Procedures:  02/28/2022: S/p laparotomy, excision of greater omental mass, abdominal wall reconstruction with [name], appendectomy, and placement of Prevena vac 01/04/2023: Placement of right internal jugular port-a-catheter with dual reservoirs also s/p re-opening of laparotomy for repair of small bowel perforation  04/13/2023:  Exploratory laparotomy, extensive lysis of adhesion, 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, partial gastrectomy, abdominal wall reconstruction with mesh placement, and wound vac placement  05/14/23  takeback exploratory laparotomy, excision of mesh, small bowel resection, creation of end colostomy/mucus fistula, double barrel ileostomy   Central access: PICC 05/03/2023   TPN start date: 04/22/2023 TPN from 02/08/2022 to 04/05/2023 - held for bacteremia and TPN restarted 05/04/2023 Previously Cyclic TPN, transitioned back to regular TPN on 04/29/23 post surgery  RD Assessment:  Estimated Needs Total Energy Estimated Needs: 1900-2200kcal/day Total Protein Estimated Needs: 95-110g/day Total Fluid Estimated Needs: 1.4-1.6L/day  Current Nutrition: NPO  Plan:  --Continue TPN at new goal rate of 65 mL/hr --Electrolytes in TPN: Na 10 mEq/L, K  22mEq/L, Ca 58mEq/L, Mg 5 mEq/L, and Phos 5 mmol/L(decreased on 9/7, elevated phos) - Mg 4 g IV x 1  --Acetate maximized  -- TG trending down.  --Continue multivitamin, trace elements, zinc, selenium and chromium chloride to TPN  --Monitor TPN labs daily until stable then on Mon/Thurs  Ronnald Ramp, PharmD, BCPS 05/16/2023 9:15 AM

## 2023-05-16 NOTE — Plan of Care (Signed)
  Problem: Clinical Measurements: Goal: Respiratory complications will improve Outcome: Progressing   Problem: Clinical Measurements: Goal: Will remain free from infection Outcome: Progressing   Problem: Nutrition: Goal: Adequate nutrition will be maintained Outcome: Progressing   Problem: Activity: Goal: Risk for activity intolerance will decrease Outcome: Progressing

## 2023-05-16 NOTE — Progress Notes (Addendum)
Status:  Discontinued        2 g 200 mL/hr over 30 Minutes Intravenous Every 8 hours 05/02/23 1530 05/04/23 0236   05/02/23 1630  vancomycin (VANCOREADY) IVPB 2000 mg/400 mL        2,000 mg 200 mL/hr over 120 Minutes Intravenous  Once 05/02/23 1530 05/02/23 1746   05/02/23 1600  metroNIDAZOLE (FLAGYL) IVPB 500 mg  Status:  Discontinued        500 mg 100 mL/hr over 60 Minutes Intravenous Every 12 hours 05/02/23 1453 05/04/23 0236   05/01/23 1400  azithromycin (ZITHROMAX) 500 mg in  sodium chloride 0.9 % 250 mL IVPB  Status:  Discontinued        500 mg 250 mL/hr over 60 Minutes Intravenous Every 24 hours 05/01/23 1255 05/02/23 1453   05/01/23 1230  azithromycin (ZITHROMAX) tablet 500 mg  Status:  Discontinued        500 mg Oral Daily 05/01/23 1135 05/01/23 1255   05/01/23 1200  cefTRIAXone (ROCEPHIN) 2 g in sodium chloride 0.9 % 100 mL IVPB  Status:  Discontinued        2 g 200 mL/hr over 30 Minutes Intravenous Every 24 hours 05/01/23 1135 05/02/23 1453   05/03/2023 2200  cefoTEtan (CEFOTAN) 2 g in sodium chloride 0.9 % 100 mL IVPB        2 g 200 mL/hr over 30 Minutes Intravenous Every 8 hours 05/05/2023 2058 04/29/23 1717   04/27/23 0945  cefoTEtan (CEFOTAN) 2 g in sodium chloride 0.9 % 100 mL IVPB        2 g 200 mL/hr over 30 Minutes Intravenous On call to O.R. 04/27/23 0856 05/02/2023 0559   04/05/23 0900  cefTRIAXone (ROCEPHIN) 2 g in sodium chloride 0.9 % 100 mL IVPB        2 g 200 mL/hr over 30 Minutes Intravenous Every 24 hours 04/05/23 0634 04/14/23 1254   01/02/2023 1000  ceFAZolin (ANCEF) IVPB 2g/100 mL premix        2 g 200 mL/hr over 30 Minutes Intravenous  Once 12/18/22 1738 12/23/2022 1141   09/17/22 1200  vancomycin (VANCOREADY) IVPB 1250 mg/250 mL  Status:  Discontinued        1,250 mg 166.7 mL/hr over 90 Minutes Intravenous Every 24 hours 09/16/22 1040 09/22/22 1139   09/16/22 1200  vancomycin (VANCOREADY) IVPB 1250 mg/250 mL        1,250 mg 166.7 mL/hr over 90 Minutes Intravenous  Once 09/15/22 1706 09/16/22 1255   09/15/22 1200  ceFEPIme (MAXIPIME) 2 g in sodium chloride 0.9 % 100 mL IVPB  Status:  Discontinued        2 g 200 mL/hr over 30 Minutes Intravenous Every 8 hours 09/15/22 1024 09/18/22 1026   09/15/22 1200  vancomycin (VANCOREADY) IVPB 2000 mg/400 mL        2,000 mg 200 mL/hr over 120 Minutes Intravenous  Once 09/15/22 1024 09/15/22 1500   03/04/22 2200  piperacillin-tazobactam (ZOSYN) IVPB 3.375 g        3.375 g 12.5 mL/hr over 240 Minutes  Intravenous Every 8 hours 03/04/22 2005 03/18/22 0248   02/21/22 1500  vancomycin (VANCOREADY) IVPB 1250 mg/250 mL  Status:  Discontinued        1,250 mg 166.7 mL/hr over 90 Minutes Intravenous Every 24 hours 02/21/22 1111 02/24/22 1336   02/21/22 1400  meropenem (MERREM) 1 g in sodium chloride 0.9 % 100 mL IVPB        1 g 200  Status:  Discontinued        2 g 200 mL/hr over 30 Minutes Intravenous Every 8 hours 05/02/23 1530 05/04/23 0236   05/02/23 1630  vancomycin (VANCOREADY) IVPB 2000 mg/400 mL        2,000 mg 200 mL/hr over 120 Minutes Intravenous  Once 05/02/23 1530 05/02/23 1746   05/02/23 1600  metroNIDAZOLE (FLAGYL) IVPB 500 mg  Status:  Discontinued        500 mg 100 mL/hr over 60 Minutes Intravenous Every 12 hours 05/02/23 1453 05/04/23 0236   05/01/23 1400  azithromycin (ZITHROMAX) 500 mg in  sodium chloride 0.9 % 250 mL IVPB  Status:  Discontinued        500 mg 250 mL/hr over 60 Minutes Intravenous Every 24 hours 05/01/23 1255 05/02/23 1453   05/01/23 1230  azithromycin (ZITHROMAX) tablet 500 mg  Status:  Discontinued        500 mg Oral Daily 05/01/23 1135 05/01/23 1255   05/01/23 1200  cefTRIAXone (ROCEPHIN) 2 g in sodium chloride 0.9 % 100 mL IVPB  Status:  Discontinued        2 g 200 mL/hr over 30 Minutes Intravenous Every 24 hours 05/01/23 1135 05/02/23 1453   05/03/2023 2200  cefoTEtan (CEFOTAN) 2 g in sodium chloride 0.9 % 100 mL IVPB        2 g 200 mL/hr over 30 Minutes Intravenous Every 8 hours 05/05/2023 2058 04/29/23 1717   04/27/23 0945  cefoTEtan (CEFOTAN) 2 g in sodium chloride 0.9 % 100 mL IVPB        2 g 200 mL/hr over 30 Minutes Intravenous On call to O.R. 04/27/23 0856 05/02/2023 0559   04/05/23 0900  cefTRIAXone (ROCEPHIN) 2 g in sodium chloride 0.9 % 100 mL IVPB        2 g 200 mL/hr over 30 Minutes Intravenous Every 24 hours 04/05/23 0634 04/14/23 1254   01/02/2023 1000  ceFAZolin (ANCEF) IVPB 2g/100 mL premix        2 g 200 mL/hr over 30 Minutes Intravenous  Once 12/18/22 1738 12/23/2022 1141   09/17/22 1200  vancomycin (VANCOREADY) IVPB 1250 mg/250 mL  Status:  Discontinued        1,250 mg 166.7 mL/hr over 90 Minutes Intravenous Every 24 hours 09/16/22 1040 09/22/22 1139   09/16/22 1200  vancomycin (VANCOREADY) IVPB 1250 mg/250 mL        1,250 mg 166.7 mL/hr over 90 Minutes Intravenous  Once 09/15/22 1706 09/16/22 1255   09/15/22 1200  ceFEPIme (MAXIPIME) 2 g in sodium chloride 0.9 % 100 mL IVPB  Status:  Discontinued        2 g 200 mL/hr over 30 Minutes Intravenous Every 8 hours 09/15/22 1024 09/18/22 1026   09/15/22 1200  vancomycin (VANCOREADY) IVPB 2000 mg/400 mL        2,000 mg 200 mL/hr over 120 Minutes Intravenous  Once 09/15/22 1024 09/15/22 1500   03/04/22 2200  piperacillin-tazobactam (ZOSYN) IVPB 3.375 g        3.375 g 12.5 mL/hr over 240 Minutes  Intravenous Every 8 hours 03/04/22 2005 03/18/22 0248   02/21/22 1500  vancomycin (VANCOREADY) IVPB 1250 mg/250 mL  Status:  Discontinued        1,250 mg 166.7 mL/hr over 90 Minutes Intravenous Every 24 hours 02/21/22 1111 02/24/22 1336   02/21/22 1400  meropenem (MERREM) 1 g in sodium chloride 0.9 % 100 mL IVPB        1 g 200  Status:  Discontinued        2 g 200 mL/hr over 30 Minutes Intravenous Every 8 hours 05/02/23 1530 05/04/23 0236   05/02/23 1630  vancomycin (VANCOREADY) IVPB 2000 mg/400 mL        2,000 mg 200 mL/hr over 120 Minutes Intravenous  Once 05/02/23 1530 05/02/23 1746   05/02/23 1600  metroNIDAZOLE (FLAGYL) IVPB 500 mg  Status:  Discontinued        500 mg 100 mL/hr over 60 Minutes Intravenous Every 12 hours 05/02/23 1453 05/04/23 0236   05/01/23 1400  azithromycin (ZITHROMAX) 500 mg in  sodium chloride 0.9 % 250 mL IVPB  Status:  Discontinued        500 mg 250 mL/hr over 60 Minutes Intravenous Every 24 hours 05/01/23 1255 05/02/23 1453   05/01/23 1230  azithromycin (ZITHROMAX) tablet 500 mg  Status:  Discontinued        500 mg Oral Daily 05/01/23 1135 05/01/23 1255   05/01/23 1200  cefTRIAXone (ROCEPHIN) 2 g in sodium chloride 0.9 % 100 mL IVPB  Status:  Discontinued        2 g 200 mL/hr over 30 Minutes Intravenous Every 24 hours 05/01/23 1135 05/02/23 1453   05/03/2023 2200  cefoTEtan (CEFOTAN) 2 g in sodium chloride 0.9 % 100 mL IVPB        2 g 200 mL/hr over 30 Minutes Intravenous Every 8 hours 05/05/2023 2058 04/29/23 1717   04/27/23 0945  cefoTEtan (CEFOTAN) 2 g in sodium chloride 0.9 % 100 mL IVPB        2 g 200 mL/hr over 30 Minutes Intravenous On call to O.R. 04/27/23 0856 05/02/2023 0559   04/05/23 0900  cefTRIAXone (ROCEPHIN) 2 g in sodium chloride 0.9 % 100 mL IVPB        2 g 200 mL/hr over 30 Minutes Intravenous Every 24 hours 04/05/23 0634 04/14/23 1254   01/02/2023 1000  ceFAZolin (ANCEF) IVPB 2g/100 mL premix        2 g 200 mL/hr over 30 Minutes Intravenous  Once 12/18/22 1738 12/23/2022 1141   09/17/22 1200  vancomycin (VANCOREADY) IVPB 1250 mg/250 mL  Status:  Discontinued        1,250 mg 166.7 mL/hr over 90 Minutes Intravenous Every 24 hours 09/16/22 1040 09/22/22 1139   09/16/22 1200  vancomycin (VANCOREADY) IVPB 1250 mg/250 mL        1,250 mg 166.7 mL/hr over 90 Minutes Intravenous  Once 09/15/22 1706 09/16/22 1255   09/15/22 1200  ceFEPIme (MAXIPIME) 2 g in sodium chloride 0.9 % 100 mL IVPB  Status:  Discontinued        2 g 200 mL/hr over 30 Minutes Intravenous Every 8 hours 09/15/22 1024 09/18/22 1026   09/15/22 1200  vancomycin (VANCOREADY) IVPB 2000 mg/400 mL        2,000 mg 200 mL/hr over 120 Minutes Intravenous  Once 09/15/22 1024 09/15/22 1500   03/04/22 2200  piperacillin-tazobactam (ZOSYN) IVPB 3.375 g        3.375 g 12.5 mL/hr over 240 Minutes  Intravenous Every 8 hours 03/04/22 2005 03/18/22 0248   02/21/22 1500  vancomycin (VANCOREADY) IVPB 1250 mg/250 mL  Status:  Discontinued        1,250 mg 166.7 mL/hr over 90 Minutes Intravenous Every 24 hours 02/21/22 1111 02/24/22 1336   02/21/22 1400  meropenem (MERREM) 1 g in sodium chloride 0.9 % 100 mL IVPB        1 g 200  EXCISION OF MESH  05/11/2023   Procedure: EXCISION OF MESH;  Surgeon:  Leafy Ro, MD;  Location: [facility];  Service: General;;  excision of abdominal mesh   ILEOSTOMY  05/23/2023   Procedure: ILEOSTOMY;  Surgeon: Leafy Ro, MD;  Location: [facility];  Service: General;;  double barrel   INSERTION OF MESH N/A 03/02/2022   Procedure: INSERTION OF MESH;  Surgeon: Leafy Ro, MD;  Location: [facility];  Service: General;  Laterality: N/A;   INSERTION OF MESH N/A 02/18/2022   Procedure: INSERTION OF MESH;  Surgeon: Leafy Ro, MD;  Location: [facility];  Service: General;  Laterality: N/A;   IR RADIOLOGIST EVAL & MGMT  07/03/2020   IR RADIOLOGIST EVAL & MGMT  07/12/2020   LAPAROTOMY N/A 03/03/2022   Procedure: EXPLORATORY LAPAROTOMY REPAIR OF BOWEL PERFORATION;  Surgeon: Leafy Ro, MD;  Location: [facility];  Service: General;  Laterality: N/A;   LAPAROTOMY N/A 05/01/2023   Procedure: EXPLORATORY LAPAROTOMY; POSSIBLE SMALL BOWEL RESECTION;  Surgeon: Leafy Ro, MD;  Location: [facility];  Service: General;  Laterality: N/A;   LAPAROTOMY N/A 06/06/2023   Procedure: EXPLORATORY LAPAROTOMY;  Surgeon: Leafy Ro, MD;  Location: [facility];  Service: General;  Laterality: N/A;   PARTIAL COLECTOMY N/A 01/30/2020   Procedure: PARTIAL COLECTOMY Sigmoid;  Surgeon: Duanne Guess, MD;  Location: [facility];  Service: General;  Laterality: N/A;   PORTACATH PLACEMENT Left 03/28/2020   Procedure: INSERTION PORT-A-CATH;  Surgeon: Duanne Guess, MD;  Location: [facility];  Service: General;  Laterality: Left;   PORTACATH PLACEMENT N/A 08/29/2020   Procedure: INSERTION PORT-A-CATH, chemotherapy;  Surgeon: Duanne Guess, MD;  Location: [facility];  Service: General;  Laterality: N/A;   PORTACATH PLACEMENT N/A 12/18/2022   Procedure: INSERTION PORT-A-CATH;  Surgeon: Leafy Ro, MD;  Location: [facility];  Service: General;  Laterality: N/A;   VENTRAL HERNIA REPAIR N/A 02/14/2022   Procedure: HERNIA REPAIR VENTRAL ADULT, open, [name], PA-C to assist;  Surgeon: Leafy Ro, MD;  Location: [facility];  Service: General;  Laterality: N/A;   Scheduled Meds:  sodium chloride   Intravenous Once   sodium chloride   Intravenous Once   alteplase  2 mg Intracatheter Once   Chlorhexidine Gluconate Cloth  6 each Topical Daily   docusate  100 mg Per Tube BID   heparin injection (subcutaneous)  5,000 Units Subcutaneous Q8H   levothyroxine  137 mcg Per Tube Q0600   lidocaine  2 patch Transdermal Q24H   loperamide HCl  4 mg Per Tube TID   octreotide  200 mcg Subcutaneous Q8H   mouth rinse  15 mL Mouth Rinse Q2H   pantoprazole (PROTONIX) IV  40 mg Intravenous Q12H   polyethylene glycol  17 g Per Tube Daily   sodium chloride flush  10-40 mL Intracatheter Q12H   Continuous Infusions:  sodium chloride 150 mL/hr at 05/16/23 0300   albumin human Stopped (05/15/23 2356)   fentaNYL infusion INTRAVENOUS 175 mcg/hr (05/16/23 0300)   fluconazole (DIFLUCAN) IV Stopped (05/15/23 1018)   insulin 5.5 Units/hr (05/16/23 0300)   methocarbamol (ROBAXIN) IV     midazolam 2 mg/hr (05/16/23 0300)   norepinephrine (LEVOPHED) Adult infusion 11 mcg/min (05/16/23 0300)   piperacillin-tazobactam (ZOSYN)  IV 12.5 mL/hr at 05/16/23 0300   propofol (DIPRIVAN) infusion Stopped (05/15/23 1312)   TPN ADULT (ION) 65 mL/hr at 05/16/23 0300   vasopressin Stopped (05/15/23 0922)   PRN Meds:.dextrose, dextrose, diphenhydrAMINE **  Status:  Discontinued        2 g 200 mL/hr over 30 Minutes Intravenous Every 8 hours 05/02/23 1530 05/04/23 0236   05/02/23 1630  vancomycin (VANCOREADY) IVPB 2000 mg/400 mL        2,000 mg 200 mL/hr over 120 Minutes Intravenous  Once 05/02/23 1530 05/02/23 1746   05/02/23 1600  metroNIDAZOLE (FLAGYL) IVPB 500 mg  Status:  Discontinued        500 mg 100 mL/hr over 60 Minutes Intravenous Every 12 hours 05/02/23 1453 05/04/23 0236   05/01/23 1400  azithromycin (ZITHROMAX) 500 mg in  sodium chloride 0.9 % 250 mL IVPB  Status:  Discontinued        500 mg 250 mL/hr over 60 Minutes Intravenous Every 24 hours 05/01/23 1255 05/02/23 1453   05/01/23 1230  azithromycin (ZITHROMAX) tablet 500 mg  Status:  Discontinued        500 mg Oral Daily 05/01/23 1135 05/01/23 1255   05/01/23 1200  cefTRIAXone (ROCEPHIN) 2 g in sodium chloride 0.9 % 100 mL IVPB  Status:  Discontinued        2 g 200 mL/hr over 30 Minutes Intravenous Every 24 hours 05/01/23 1135 05/02/23 1453   05/03/2023 2200  cefoTEtan (CEFOTAN) 2 g in sodium chloride 0.9 % 100 mL IVPB        2 g 200 mL/hr over 30 Minutes Intravenous Every 8 hours 05/05/2023 2058 04/29/23 1717   04/27/23 0945  cefoTEtan (CEFOTAN) 2 g in sodium chloride 0.9 % 100 mL IVPB        2 g 200 mL/hr over 30 Minutes Intravenous On call to O.R. 04/27/23 0856 05/02/2023 0559   04/05/23 0900  cefTRIAXone (ROCEPHIN) 2 g in sodium chloride 0.9 % 100 mL IVPB        2 g 200 mL/hr over 30 Minutes Intravenous Every 24 hours 04/05/23 0634 04/14/23 1254   01/02/2023 1000  ceFAZolin (ANCEF) IVPB 2g/100 mL premix        2 g 200 mL/hr over 30 Minutes Intravenous  Once 12/18/22 1738 12/23/2022 1141   09/17/22 1200  vancomycin (VANCOREADY) IVPB 1250 mg/250 mL  Status:  Discontinued        1,250 mg 166.7 mL/hr over 90 Minutes Intravenous Every 24 hours 09/16/22 1040 09/22/22 1139   09/16/22 1200  vancomycin (VANCOREADY) IVPB 1250 mg/250 mL        1,250 mg 166.7 mL/hr over 90 Minutes Intravenous  Once 09/15/22 1706 09/16/22 1255   09/15/22 1200  ceFEPIme (MAXIPIME) 2 g in sodium chloride 0.9 % 100 mL IVPB  Status:  Discontinued        2 g 200 mL/hr over 30 Minutes Intravenous Every 8 hours 09/15/22 1024 09/18/22 1026   09/15/22 1200  vancomycin (VANCOREADY) IVPB 2000 mg/400 mL        2,000 mg 200 mL/hr over 120 Minutes Intravenous  Once 09/15/22 1024 09/15/22 1500   03/04/22 2200  piperacillin-tazobactam (ZOSYN) IVPB 3.375 g        3.375 g 12.5 mL/hr over 240 Minutes  Intravenous Every 8 hours 03/04/22 2005 03/18/22 0248   02/21/22 1500  vancomycin (VANCOREADY) IVPB 1250 mg/250 mL  Status:  Discontinued        1,250 mg 166.7 mL/hr over 90 Minutes Intravenous Every 24 hours 02/21/22 1111 02/24/22 1336   02/21/22 1400  meropenem (MERREM) 1 g in sodium chloride 0.9 % 100 mL IVPB        1 g 200  EXCISION OF MESH  05/11/2023   Procedure: EXCISION OF MESH;  Surgeon:  Leafy Ro, MD;  Location: [facility];  Service: General;;  excision of abdominal mesh   ILEOSTOMY  05/23/2023   Procedure: ILEOSTOMY;  Surgeon: Leafy Ro, MD;  Location: [facility];  Service: General;;  double barrel   INSERTION OF MESH N/A 03/02/2022   Procedure: INSERTION OF MESH;  Surgeon: Leafy Ro, MD;  Location: [facility];  Service: General;  Laterality: N/A;   INSERTION OF MESH N/A 02/18/2022   Procedure: INSERTION OF MESH;  Surgeon: Leafy Ro, MD;  Location: [facility];  Service: General;  Laterality: N/A;   IR RADIOLOGIST EVAL & MGMT  07/03/2020   IR RADIOLOGIST EVAL & MGMT  07/12/2020   LAPAROTOMY N/A 03/03/2022   Procedure: EXPLORATORY LAPAROTOMY REPAIR OF BOWEL PERFORATION;  Surgeon: Leafy Ro, MD;  Location: [facility];  Service: General;  Laterality: N/A;   LAPAROTOMY N/A 05/01/2023   Procedure: EXPLORATORY LAPAROTOMY; POSSIBLE SMALL BOWEL RESECTION;  Surgeon: Leafy Ro, MD;  Location: [facility];  Service: General;  Laterality: N/A;   LAPAROTOMY N/A 06/06/2023   Procedure: EXPLORATORY LAPAROTOMY;  Surgeon: Leafy Ro, MD;  Location: [facility];  Service: General;  Laterality: N/A;   PARTIAL COLECTOMY N/A 01/30/2020   Procedure: PARTIAL COLECTOMY Sigmoid;  Surgeon: Duanne Guess, MD;  Location: [facility];  Service: General;  Laterality: N/A;   PORTACATH PLACEMENT Left 03/28/2020   Procedure: INSERTION PORT-A-CATH;  Surgeon: Duanne Guess, MD;  Location: [facility];  Service: General;  Laterality: Left;   PORTACATH PLACEMENT N/A 08/29/2020   Procedure: INSERTION PORT-A-CATH, chemotherapy;  Surgeon: Duanne Guess, MD;  Location: [facility];  Service: General;  Laterality: N/A;   PORTACATH PLACEMENT N/A 12/18/2022   Procedure: INSERTION PORT-A-CATH;  Surgeon: Leafy Ro, MD;  Location: [facility];  Service: General;  Laterality: N/A;   VENTRAL HERNIA REPAIR N/A 02/14/2022   Procedure: HERNIA REPAIR VENTRAL ADULT, open, [name], PA-C to assist;  Surgeon: Leafy Ro, MD;  Location: [facility];  Service: General;  Laterality: N/A;   Scheduled Meds:  sodium chloride   Intravenous Once   sodium chloride   Intravenous Once   alteplase  2 mg Intracatheter Once   Chlorhexidine Gluconate Cloth  6 each Topical Daily   docusate  100 mg Per Tube BID   heparin injection (subcutaneous)  5,000 Units Subcutaneous Q8H   levothyroxine  137 mcg Per Tube Q0600   lidocaine  2 patch Transdermal Q24H   loperamide HCl  4 mg Per Tube TID   octreotide  200 mcg Subcutaneous Q8H   mouth rinse  15 mL Mouth Rinse Q2H   pantoprazole (PROTONIX) IV  40 mg Intravenous Q12H   polyethylene glycol  17 g Per Tube Daily   sodium chloride flush  10-40 mL Intracatheter Q12H   Continuous Infusions:  sodium chloride 150 mL/hr at 05/16/23 0300   albumin human Stopped (05/15/23 2356)   fentaNYL infusion INTRAVENOUS 175 mcg/hr (05/16/23 0300)   fluconazole (DIFLUCAN) IV Stopped (05/15/23 1018)   insulin 5.5 Units/hr (05/16/23 0300)   methocarbamol (ROBAXIN) IV     midazolam 2 mg/hr (05/16/23 0300)   norepinephrine (LEVOPHED) Adult infusion 11 mcg/min (05/16/23 0300)   piperacillin-tazobactam (ZOSYN)  IV 12.5 mL/hr at 05/16/23 0300   propofol (DIPRIVAN) infusion Stopped (05/15/23 1312)   TPN ADULT (ION) 65 mL/hr at 05/16/23 0300   vasopressin Stopped (05/15/23 0922)   PRN Meds:.dextrose, dextrose, diphenhydrAMINE **  Status:  Discontinued        2 g 200 mL/hr over 30 Minutes Intravenous Every 8 hours 05/02/23 1530 05/04/23 0236   05/02/23 1630  vancomycin (VANCOREADY) IVPB 2000 mg/400 mL        2,000 mg 200 mL/hr over 120 Minutes Intravenous  Once 05/02/23 1530 05/02/23 1746   05/02/23 1600  metroNIDAZOLE (FLAGYL) IVPB 500 mg  Status:  Discontinued        500 mg 100 mL/hr over 60 Minutes Intravenous Every 12 hours 05/02/23 1453 05/04/23 0236   05/01/23 1400  azithromycin (ZITHROMAX) 500 mg in  sodium chloride 0.9 % 250 mL IVPB  Status:  Discontinued        500 mg 250 mL/hr over 60 Minutes Intravenous Every 24 hours 05/01/23 1255 05/02/23 1453   05/01/23 1230  azithromycin (ZITHROMAX) tablet 500 mg  Status:  Discontinued        500 mg Oral Daily 05/01/23 1135 05/01/23 1255   05/01/23 1200  cefTRIAXone (ROCEPHIN) 2 g in sodium chloride 0.9 % 100 mL IVPB  Status:  Discontinued        2 g 200 mL/hr over 30 Minutes Intravenous Every 24 hours 05/01/23 1135 05/02/23 1453   05/03/2023 2200  cefoTEtan (CEFOTAN) 2 g in sodium chloride 0.9 % 100 mL IVPB        2 g 200 mL/hr over 30 Minutes Intravenous Every 8 hours 05/05/2023 2058 04/29/23 1717   04/27/23 0945  cefoTEtan (CEFOTAN) 2 g in sodium chloride 0.9 % 100 mL IVPB        2 g 200 mL/hr over 30 Minutes Intravenous On call to O.R. 04/27/23 0856 05/02/2023 0559   04/05/23 0900  cefTRIAXone (ROCEPHIN) 2 g in sodium chloride 0.9 % 100 mL IVPB        2 g 200 mL/hr over 30 Minutes Intravenous Every 24 hours 04/05/23 0634 04/14/23 1254   01/02/2023 1000  ceFAZolin (ANCEF) IVPB 2g/100 mL premix        2 g 200 mL/hr over 30 Minutes Intravenous  Once 12/18/22 1738 12/23/2022 1141   09/17/22 1200  vancomycin (VANCOREADY) IVPB 1250 mg/250 mL  Status:  Discontinued        1,250 mg 166.7 mL/hr over 90 Minutes Intravenous Every 24 hours 09/16/22 1040 09/22/22 1139   09/16/22 1200  vancomycin (VANCOREADY) IVPB 1250 mg/250 mL        1,250 mg 166.7 mL/hr over 90 Minutes Intravenous  Once 09/15/22 1706 09/16/22 1255   09/15/22 1200  ceFEPIme (MAXIPIME) 2 g in sodium chloride 0.9 % 100 mL IVPB  Status:  Discontinued        2 g 200 mL/hr over 30 Minutes Intravenous Every 8 hours 09/15/22 1024 09/18/22 1026   09/15/22 1200  vancomycin (VANCOREADY) IVPB 2000 mg/400 mL        2,000 mg 200 mL/hr over 120 Minutes Intravenous  Once 09/15/22 1024 09/15/22 1500   03/04/22 2200  piperacillin-tazobactam (ZOSYN) IVPB 3.375 g        3.375 g 12.5 mL/hr over 240 Minutes  Intravenous Every 8 hours 03/04/22 2005 03/18/22 0248   02/21/22 1500  vancomycin (VANCOREADY) IVPB 1250 mg/250 mL  Status:  Discontinued        1,250 mg 166.7 mL/hr over 90 Minutes Intravenous Every 24 hours 02/21/22 1111 02/24/22 1336   02/21/22 1400  meropenem (MERREM) 1 g in sodium chloride 0.9 % 100 mL IVPB        1 g 200  EXCISION OF MESH  05/11/2023   Procedure: EXCISION OF MESH;  Surgeon:  Leafy Ro, MD;  Location: [facility];  Service: General;;  excision of abdominal mesh   ILEOSTOMY  05/23/2023   Procedure: ILEOSTOMY;  Surgeon: Leafy Ro, MD;  Location: [facility];  Service: General;;  double barrel   INSERTION OF MESH N/A 03/02/2022   Procedure: INSERTION OF MESH;  Surgeon: Leafy Ro, MD;  Location: [facility];  Service: General;  Laterality: N/A;   INSERTION OF MESH N/A 02/18/2022   Procedure: INSERTION OF MESH;  Surgeon: Leafy Ro, MD;  Location: [facility];  Service: General;  Laterality: N/A;   IR RADIOLOGIST EVAL & MGMT  07/03/2020   IR RADIOLOGIST EVAL & MGMT  07/12/2020   LAPAROTOMY N/A 03/03/2022   Procedure: EXPLORATORY LAPAROTOMY REPAIR OF BOWEL PERFORATION;  Surgeon: Leafy Ro, MD;  Location: [facility];  Service: General;  Laterality: N/A;   LAPAROTOMY N/A 05/01/2023   Procedure: EXPLORATORY LAPAROTOMY; POSSIBLE SMALL BOWEL RESECTION;  Surgeon: Leafy Ro, MD;  Location: [facility];  Service: General;  Laterality: N/A;   LAPAROTOMY N/A 06/06/2023   Procedure: EXPLORATORY LAPAROTOMY;  Surgeon: Leafy Ro, MD;  Location: [facility];  Service: General;  Laterality: N/A;   PARTIAL COLECTOMY N/A 01/30/2020   Procedure: PARTIAL COLECTOMY Sigmoid;  Surgeon: Duanne Guess, MD;  Location: [facility];  Service: General;  Laterality: N/A;   PORTACATH PLACEMENT Left 03/28/2020   Procedure: INSERTION PORT-A-CATH;  Surgeon: Duanne Guess, MD;  Location: [facility];  Service: General;  Laterality: Left;   PORTACATH PLACEMENT N/A 08/29/2020   Procedure: INSERTION PORT-A-CATH, chemotherapy;  Surgeon: Duanne Guess, MD;  Location: [facility];  Service: General;  Laterality: N/A;   PORTACATH PLACEMENT N/A 12/18/2022   Procedure: INSERTION PORT-A-CATH;  Surgeon: Leafy Ro, MD;  Location: [facility];  Service: General;  Laterality: N/A;   VENTRAL HERNIA REPAIR N/A 02/14/2022   Procedure: HERNIA REPAIR VENTRAL ADULT, open, [name], PA-C to assist;  Surgeon: Leafy Ro, MD;  Location: [facility];  Service: General;  Laterality: N/A;   Scheduled Meds:  sodium chloride   Intravenous Once   sodium chloride   Intravenous Once   alteplase  2 mg Intracatheter Once   Chlorhexidine Gluconate Cloth  6 each Topical Daily   docusate  100 mg Per Tube BID   heparin injection (subcutaneous)  5,000 Units Subcutaneous Q8H   levothyroxine  137 mcg Per Tube Q0600   lidocaine  2 patch Transdermal Q24H   loperamide HCl  4 mg Per Tube TID   octreotide  200 mcg Subcutaneous Q8H   mouth rinse  15 mL Mouth Rinse Q2H   pantoprazole (PROTONIX) IV  40 mg Intravenous Q12H   polyethylene glycol  17 g Per Tube Daily   sodium chloride flush  10-40 mL Intracatheter Q12H   Continuous Infusions:  sodium chloride 150 mL/hr at 05/16/23 0300   albumin human Stopped (05/15/23 2356)   fentaNYL infusion INTRAVENOUS 175 mcg/hr (05/16/23 0300)   fluconazole (DIFLUCAN) IV Stopped (05/15/23 1018)   insulin 5.5 Units/hr (05/16/23 0300)   methocarbamol (ROBAXIN) IV     midazolam 2 mg/hr (05/16/23 0300)   norepinephrine (LEVOPHED) Adult infusion 11 mcg/min (05/16/23 0300)   piperacillin-tazobactam (ZOSYN)  IV 12.5 mL/hr at 05/16/23 0300   propofol (DIPRIVAN) infusion Stopped (05/15/23 1312)   TPN ADULT (ION) 65 mL/hr at 05/16/23 0300   vasopressin Stopped (05/15/23 0922)   PRN Meds:.dextrose, dextrose, diphenhydrAMINE **

## 2023-05-16 NOTE — Progress Notes (Signed)
Seen and examined.  Family updated. Significant output from the wound VAC but is also serous.  No output from the ostomy's. On Levophed. He serous urine output and normal creatinine. It is continued to drop and we will hold heparin products due to high risk.  Likely she will require platelet transfusions tomorrow.  A/p abdominal catastrophe.  Now diverted and with an open abdomen.  Suspect the patient will always have an open abdomen and will require multiple trips to the OR with abdominal washouts.  Will perform abdominal washout tomorrow.  Discussed with the daughter in detail.  They seem to be understanding Continue TPN NG tube drains wound VAC.  Hold off enteral nutrition at this time

## 2023-05-17 ENCOUNTER — Inpatient Hospital Stay: Payer: Self-pay | Admitting: Registered Nurse

## 2023-05-17 ENCOUNTER — Encounter: Admission: RE | Disposition: E | Payer: Self-pay | Source: Home / Self Care | Attending: Surgery

## 2023-05-17 ENCOUNTER — Inpatient Hospital Stay: Payer: Medicaid Other

## 2023-05-17 ENCOUNTER — Inpatient Hospital Stay: Payer: Medicaid Other | Admitting: Registered Nurse

## 2023-05-17 ENCOUNTER — Other Ambulatory Visit: Payer: Self-pay

## 2023-05-17 HISTORY — PX: APPLICATION OF WOUND VAC: SHX5189

## 2023-05-17 HISTORY — PX: IRRIGATION AND DEBRIDEMENT ABDOMEN: SHX6600

## 2023-05-17 LAB — GLUCOSE, CAPILLARY
Glucose-Capillary: 132 mg/dL — ABNORMAL HIGH (ref 70–99)
Glucose-Capillary: 155 mg/dL — ABNORMAL HIGH (ref 70–99)
Glucose-Capillary: 167 mg/dL — ABNORMAL HIGH (ref 70–99)
Glucose-Capillary: 173 mg/dL — ABNORMAL HIGH (ref 70–99)
Glucose-Capillary: 177 mg/dL — ABNORMAL HIGH (ref 70–99)
Glucose-Capillary: 179 mg/dL — ABNORMAL HIGH (ref 70–99)
Glucose-Capillary: 181 mg/dL — ABNORMAL HIGH (ref 70–99)
Glucose-Capillary: 184 mg/dL — ABNORMAL HIGH (ref 70–99)

## 2023-05-17 LAB — BASIC METABOLIC PANEL
Anion gap: 10 (ref 5–15)
BUN: 38 mg/dL — ABNORMAL HIGH (ref 6–20)
CO2: 22 mmol/L (ref 22–32)
Calcium: 7 mg/dL — ABNORMAL LOW (ref 8.9–10.3)
Chloride: 109 mmol/L (ref 98–111)
Creatinine, Ser: 0.72 mg/dL (ref 0.44–1.00)
GFR, Estimated: >60 mL/min (ref >60)
Glucose, Bld: 192 mg/dL — ABNORMAL HIGH (ref 70–99)
Potassium: 3.9 mmol/L (ref 3.5–5.1)
Sodium: 141 mmol/L (ref 135–145)

## 2023-05-17 LAB — CBC
HCT: 23.7 % — ABNORMAL LOW (ref 36.0–46.0)
Hemoglobin: 8 g/dL — ABNORMAL LOW (ref 12.0–15.0)
MCH: 30.5 pg (ref 26.0–34.0)
MCHC: 33.8 g/dL (ref 30.0–36.0)
MCV: 90.5 fL (ref 80.0–100.0)
Platelets: 47 10*3/uL — ABNORMAL LOW (ref 150–400)
RBC: 2.62 MIL/uL — ABNORMAL LOW (ref 3.87–5.11)
RDW: 19.4 % — ABNORMAL HIGH (ref 11.5–15.5)
WBC: 19.4 10*3/uL — ABNORMAL HIGH (ref 4.0–10.5)
nRBC: 3.2 % — ABNORMAL HIGH (ref 0.0–0.2)

## 2023-05-17 LAB — MAGNESIUM: Magnesium: 2.3 mg/dL (ref 1.7–2.4)

## 2023-05-17 LAB — PHOSPHORUS: Phosphorus: 4.5 mg/dL (ref 2.5–4.6)

## 2023-05-17 SURGERY — IRRIGATION AND DEBRIDEMENT ABDOMEN
Anesthesia: General | Site: Abdomen

## 2023-05-17 SURGERY — EXPLORATORY LAPAROTOMY
Anesthesia: General

## 2023-05-17 MED ORDER — FLUCONAZOLE IN SODIUM CHLORIDE 400-0.9 MG/200ML-% IV SOLN
800.0000 mg | INTRAVENOUS | Status: DC
  Filled 2023-05-17: qty 400

## 2023-05-17 MED ORDER — VASOPRESSIN 20 UNITS/100 ML INFUSION FOR SHOCK
0.0000 [IU]/min | INTRAVENOUS | Status: DC
  Administered 2023-05-17 – 2023-05-18 (×2): 0.03 [IU]/min via INTRAVENOUS
  Filled 2023-05-17 (×2): qty 100

## 2023-05-17 MED ORDER — HYDROCORTISONE SOD SUC (PF) 100 MG IJ SOLR
100.0000 mg | Freq: Three times a day (TID) | INTRAMUSCULAR | Status: DC
  Administered 2023-05-17 (×2): 100 mg via INTRAVENOUS
  Filled 2023-05-17 (×2): qty 2

## 2023-05-17 MED ORDER — SODIUM CHLORIDE 0.9% IV SOLUTION: Freq: Once | INTRAVENOUS | Status: DC

## 2023-05-17 MED ORDER — PIPERACILLIN-TAZOBACTAM 3.375 G IVPB
INTRAVENOUS | Status: AC
  Filled 2023-05-17: qty 50

## 2023-05-17 MED ORDER — ZINC CHLORIDE 1 MG/ML IV SOLN
INTRAVENOUS | Status: DC
  Filled 2023-05-17: qty 665.6

## 2023-05-17 MED ORDER — VANCOMYCIN HCL 2000 MG/400ML IV SOLN
2000.0000 mg | Freq: Once | INTRAVENOUS | Status: AC
  Administered 2023-05-17: 2000 mg via INTRAVENOUS
  Filled 2023-05-17: qty 400

## 2023-05-17 MED ORDER — SODIUM CHLORIDE 0.9 % IV SOLN
100.0000 mg | INTRAVENOUS | Status: DC
  Administered 2023-05-17: 100 mg via INTRAVENOUS
  Filled 2023-05-17 (×2): qty 5

## 2023-05-17 MED ORDER — VANCOMYCIN HCL 1500 MG/300ML IV SOLN
1500.0000 mg | INTRAVENOUS | Status: DC
  Filled 2023-05-17: qty 300

## 2023-05-17 MED ORDER — ROCURONIUM BROMIDE 100 MG/10ML IV SOLN
INTRAVENOUS | Status: DC | PRN
  Administered 2023-05-17: 50 mg via INTRAVENOUS

## 2023-05-17 MED ORDER — FUROSEMIDE 10 MG/ML IJ SOLN
80.0000 mg | Freq: Once | INTRAMUSCULAR | Status: AC
  Administered 2023-05-17: 80 mg via INTRAVENOUS
  Filled 2023-05-17: qty 8

## 2023-05-17 SURGICAL SUPPLY — 51 items
ADH LQ OCL WTPRF AMP STRL LF (MISCELLANEOUS) ×6
ADHESIVE MASTISOL STRL (MISCELLANEOUS) IMPLANT
APL PRP STRL LF DISP 70% ISPRP (MISCELLANEOUS) ×2
APPLIER CLIP 11 MED OPEN (CLIP)
APPLIER CLIP 13 LRG OPEN (CLIP)
APR CLP LRG 13 20 CLIP (CLIP)
APR CLP MED 11 20 MLT OPN (CLIP)
BARRIER ADH SEPRAFILM 3INX5IN (MISCELLANEOUS) IMPLANT
BLADE CLIPPER SURG (BLADE) ×2 IMPLANT
BRR ADH 5X3 SEPRAFILM 2 SHT (MISCELLANEOUS)
CHLORAPREP W/TINT 26 (MISCELLANEOUS) ×2 IMPLANT
CLIP APPLIE 11 MED OPEN (CLIP) IMPLANT
CLIP APPLIE 13 LRG OPEN (CLIP) IMPLANT
DRAPE LAPAROTOMY 100X77 ABD (DRAPES) ×2 IMPLANT
DRAPE TABLE BACK 80X90 (DRAPES) IMPLANT
DRSG OPSITE POSTOP 4X10 (GAUZE/BANDAGES/DRESSINGS) IMPLANT
DRSG OPSITE POSTOP 4X12 (GAUZE/BANDAGES/DRESSINGS) IMPLANT
ELECT BLADE 6.5 EXT (BLADE) ×2 IMPLANT
ELECT REM PT RETURN 9FT ADLT (ELECTROSURGICAL) ×2
ELECTRODE REM PT RTRN 9FT ADLT (ELECTROSURGICAL) ×2 IMPLANT
GLOVE BIO SURGEON STRL SZ7 (GLOVE) ×4 IMPLANT
GOWN STRL REUS W/ TWL LRG LVL3 (GOWN DISPOSABLE) ×4 IMPLANT
GOWN STRL REUS W/TWL LRG LVL3 (GOWN DISPOSABLE) ×4
LIGASURE IMPACT 36 18CM CVD LR (INSTRUMENTS) IMPLANT
MANIFOLD NEPTUNE II (INSTRUMENTS) ×2 IMPLANT
NDL HYPO 22X1.5 SAFETY MO (MISCELLANEOUS) ×4 IMPLANT
NEEDLE HYPO 22X1.5 SAFETY MO (MISCELLANEOUS) ×4 IMPLANT
NS IRRIG 1000ML POUR BTL (IV SOLUTION) IMPLANT
PACK BASIN MAJOR ARMC (MISCELLANEOUS) ×2 IMPLANT
RELOAD PROXIMATE 75MM BLUE (ENDOMECHANICALS) IMPLANT
RELOAD STAPLE 75 3.8 BLU REG (ENDOMECHANICALS) IMPLANT
SPONGE ABDOMINAL VAC ABTHERA (MISCELLANEOUS) IMPLANT
SPONGE T-LAP 18X18 ~~LOC~~+RFID (SPONGE) ×2 IMPLANT
SPONGE T-LAP 18X36 ~~LOC~~+RFID STR (SPONGE) IMPLANT
STAPLER PROXIMATE 75MM BLUE (STAPLE) IMPLANT
STAPLER SKIN PROX 35W (STAPLE) ×2 IMPLANT
SUT PDS AB 0 CT1 27 (SUTURE) ×6 IMPLANT
SUT SILK 2 0 (SUTURE) ×2
SUT SILK 2 0 SH CR/8 (SUTURE) ×2 IMPLANT
SUT SILK 2 0SH CR/8 30 (SUTURE) ×2 IMPLANT
SUT SILK 2-0 18XBRD TIE 12 (SUTURE) ×2 IMPLANT
SUT VIC AB 0 CT1 36 (SUTURE) ×4 IMPLANT
SUT VIC AB 2-0 SH 27 (SUTURE)
SUT VIC AB 2-0 SH 27XBRD (SUTURE) IMPLANT
SUT VIC AB 3-0 SH 27 (SUTURE) ×2
SUT VIC AB 3-0 SH 27X BRD (SUTURE) ×2 IMPLANT
SYR 20ML LL LF (SYRINGE) ×4 IMPLANT
SYR 3ML LL SCALE MARK (SYRINGE) ×2 IMPLANT
TRAP FLUID SMOKE EVACUATOR (MISCELLANEOUS) ×2 IMPLANT
TRAY FOLEY MTR SLVR 16FR STAT (SET/KITS/TRAYS/PACK) ×2 IMPLANT
WATER STERILE IRR 500ML POUR (IV SOLUTION) ×2 IMPLANT

## 2023-05-17 NOTE — Progress Notes (Signed)
Status:  Discontinued        2 g 200 mL/hr over 30 Minutes Intravenous Every 8 hours 05/02/23 1530 05/04/23 0236   05/02/23 1630  vancomycin (VANCOREADY) IVPB 2000 mg/400 mL        2,000 mg 200 mL/hr over 120 Minutes Intravenous  Once 05/02/23 1530 05/02/23 1746   05/02/23 1600  metroNIDAZOLE (FLAGYL) IVPB 500 mg  Status:  Discontinued        500 mg 100 mL/hr over 60 Minutes Intravenous Every 12 hours 05/02/23 1453 05/04/23 0236   05/01/23 1400  azithromycin (ZITHROMAX) 500 mg in  sodium chloride 0.9 % 250 mL IVPB  Status:  Discontinued        500 mg 250 mL/hr over 60 Minutes Intravenous Every 24 hours 05/01/23 1255 05/02/23 1453   05/01/23 1230  azithromycin (ZITHROMAX) tablet 500 mg  Status:  Discontinued        500 mg Oral Daily 05/01/23 1135 05/01/23 1255   05/01/23 1200  cefTRIAXone (ROCEPHIN) 2 g in sodium chloride 0.9 % 100 mL IVPB  Status:  Discontinued        2 g 200 mL/hr over 30 Minutes Intravenous Every 24 hours 05/01/23 1135 05/02/23 1453   04/24/2023 2200  cefoTEtan (CEFOTAN) 2 g in sodium chloride 0.9 % 100 mL IVPB        2 g 200 mL/hr over 30 Minutes Intravenous Every 8 hours 04/09/2023 2058 04/29/23 1717   04/27/23 0945  cefoTEtan (CEFOTAN) 2 g in sodium chloride 0.9 % 100 mL IVPB        2 g 200 mL/hr over 30 Minutes Intravenous On call to O.R. 04/27/23 0856 04/18/2023 0559   04/05/23 0900  cefTRIAXone (ROCEPHIN) 2 g in sodium chloride 0.9 % 100 mL IVPB        2 g 200 mL/hr over 30 Minutes Intravenous Every 24 hours 04/05/23 0634 04/14/23 1254   12/16/2022 1000  ceFAZolin (ANCEF) IVPB 2g/100 mL premix        2 g 200 mL/hr over 30 Minutes Intravenous  Once 12/18/22 1738 12/28/2022 1141   09/17/22 1200  vancomycin (VANCOREADY) IVPB 1250 mg/250 mL  Status:  Discontinued        1,250 mg 166.7 mL/hr over 90 Minutes Intravenous Every 24 hours 09/16/22 1040 09/22/22 1139   09/16/22 1200  vancomycin (VANCOREADY) IVPB 1250 mg/250 mL        1,250 mg 166.7 mL/hr over 90 Minutes Intravenous  Once 09/15/22 1706 09/16/22 1255   09/15/22 1200  ceFEPIme (MAXIPIME) 2 g in sodium chloride 0.9 % 100 mL IVPB  Status:  Discontinued        2 g 200 mL/hr over 30 Minutes Intravenous Every 8 hours 09/15/22 1024 09/18/22 1026   09/15/22 1200  vancomycin (VANCOREADY) IVPB 2000 mg/400 mL        2,000 mg 200 mL/hr over 120 Minutes Intravenous  Once 09/15/22 1024 09/15/22 1500   03/04/22 2200  piperacillin-tazobactam (ZOSYN) IVPB 3.375 g        3.375 g 12.5 mL/hr over 240 Minutes  Intravenous Every 8 hours 03/04/22 2005 03/18/22 0248   02/21/22 1500  vancomycin (VANCOREADY) IVPB 1250 mg/250 mL  Status:  Discontinued        1,250 mg 166.7 mL/hr over 90 Minutes Intravenous Every 24 hours 02/21/22 1111 02/24/22 1336   02/21/22 1400  meropenem (MERREM) 1 g in sodium chloride 0.9 % 100 mL IVPB        1 g 200  5.7%-6.4%  Diabetes:              >6.4%  Glycemic control for   <7.0% adults with diabetes     CBG: Recent Labs  Lab 05/16/23 2225 05/10/2023 0019 05/26/2023 0123 05/27/2023 0519 05/16/2023 0621  GLUCAP 179* 181* 184* 179* 173*    Review of Systems:   UTA patient intubated and sedated unable to participate in interview at this time  Past Medical History:  She,  has a past medical history of Anemia, Colon cancer (HCC) (04/2020), History of kidney stones, Hypothyroidism, and Thyroid disease.   Surgical History:   Past Surgical History:  Procedure Laterality Date   ABDOMINAL WALL DEFECT REPAIR N/A 02/20/2022   Procedure: REPAIR ABDOMINAL WALL;  Surgeon: Leafy Ro, MD;  Location: [facility];  Service: General;  Laterality: N/A;   ABDOMINAL WALL DEFECT REPAIR N/A 04/14/2023   Procedure: Reconstruction of ABDOMINAL WALL;  Surgeon: Leafy Ro, MD;  Location: [facility];  Service: General;  Laterality: N/A;   APPENDECTOMY N/A 02/14/2022   Procedure: APPENDECTOMY;  Surgeon: Leafy Ro, MD;  Location: [facility];  Service: General;  Laterality: N/A;   APPLICATION OF WOUND VAC N/A 03/02/2022   Procedure: APPLICATION OF WOUND VAC;  Surgeon: Leafy Ro, MD;  Location: [facility];  Service: General;  Laterality: N/A;  [id number]   APPLICATION OF WOUND VAC  06/04/2023   Procedure: APPLICATION OF WOUND  VAC;  Surgeon: Leafy Ro, MD;  Location: [facility];  Service: General;;  Tawni Pummel Wound VAC application   BOWEL RESECTION N/A 04/27/2023   Procedure: SMALL BOWEL RESECTION;  Surgeon: Leafy Ro, MD;  Location: [facility];  Service: General;  Laterality: N/A;   BOWEL RESECTION N/A 06/06/2023   Procedure: SMALL BOWEL RESECTION;  Surgeon: Leafy Ro, MD;  Location: [facility];  Service: General;  Laterality: N/A;  small bowel resection   and resectionof small bowel anastamosis   COLON RESECTION SIGMOID N/A 02/07/2022   Procedure: COLON RESECTION SIGMOID, open, [name], PA-C to assist;  Surgeon: Leafy Ro, MD;  Location: [facility];  Service: General;  Laterality: N/A;   COLONOSCOPY WITH PROPOFOL N/A 11/21/2019   Procedure: COLONOSCOPY WITH PROPOFOL;  Surgeon: Toney Reil, MD;  Location: [facility];  Service: Gastroenterology;  Laterality: N/A;   COLONOSCOPY WITH PROPOFOL N/A 07/10/2021   Procedure: COLONOSCOPY WITH PROPOFOL;  Surgeon: Toney Reil, MD;  Location: [facility] ENDOSCOPY;  Service: Gastroenterology;  Laterality: N/A;   COLONOSCOPY WITH PROPOFOL N/A 02/06/2022   Procedure: COLONOSCOPY WITH PROPOFOL;  Surgeon: Toney Reil, MD;  Location: [facility] ENDOSCOPY;  Service: Gastroenterology;  Laterality: N/A;  SPANISH   COLOSTOMY  05/16/2023   Procedure: COLOSTOMY;  Surgeon: Leafy Ro, MD;  Location: [facility];  Service: General;;  End Colostomy   CYSTOSCOPY WITH STENT PLACEMENT Left 01/30/2020   Procedure: CYSTOSCOPY WITH STENT PLACEMENT;  Surgeon: Vanna Scotland, MD;  Location: [facility];  Service: Urology;  Laterality: Left;   ESOPHAGOGASTRODUODENOSCOPY (EGD) WITH PROPOFOL N/A 11/21/2019   Procedure: ESOPHAGOGASTRODUODENOSCOPY (EGD) WITH PROPOFOL;  Surgeon: Toney Reil, MD;  Location: [facility] ENDOSCOPY;  Service: Gastroenterology;  Laterality: N/A;   EXCISION OF MESH  05/23/2023   Procedure: EXCISION OF MESH;  Surgeon: Leafy Ro, MD;  Location: [facility];   Service: General;;  excision of abdominal mesh   ILEOSTOMY  05/13/2023   Procedure: ILEOSTOMY;  Surgeon: Leafy Ro, MD;  Location: [facility];  Service: General;;  double barrel  kg 104.1 kg 124.1 kg   REVIEW OF SYSTEMS  PATIENT IS UNABLE TO PROVIDE COMPLETE REVIEW OF SYSTEMS DUE TO SEVERE CRITICAL ILLNESS   PHYSICAL EXAMINATION:  GENERAL:critically ill appearing, +resp distress EYES: Pupils equal, round, reactive to light.  + scleral icterus.  MOUTH: Moist mucosal membrane. INTUBATED NECK: Supple.   PULMONARY: Lungs clear to auscultation, +rhonchi, +wheezing CARDIOVASCULAR: S1 and S2.  Regular rate and rhythm GASTROINTESTINAL: Soft, nontender, -distended. Positive bowel sounds.  MUSCULOSKELETAL: No swelling, clubbing, or edema.  NEUROLOGIC: obtunded,sedated SKIN:normal, warm to touch, Capillary refill delayed  Pulses present bilaterally   Labs   CBC: Recent Labs  Lab 05/11/2023 1902 05/15/23 0119 05/15/23 2020 05/16/23 0106 05/16/23 0419 06/04/2023 0346  WBC 8.6 6.8 11.3*  --  14.7* 19.4*  NEUTROABS 6.6  --   --   --   --   --   HGB 7.9* 7.8* 6.8* 8.6* 8.5* 8.0*  HCT 23.1* 21.1* 19.6* 24.5* 24.3* 23.7*  MCV 89.5 88.7 87.9  --  88.4 90.5  PLT 60* 40* 39*  --  38* 47*    Basic Metabolic Panel: Recent Labs  Lab 05/11/23 0639 05/12/23 0643 05/23/2023 1644 05/22/2023 1902 05/15/23 0529 05/16/23 0419 05/24/2023 0346  NA 150*   < > 128* 125* 136 138 141  K 4.6   < > 3.2* 3.4* 3.5 3.8 3.9  CL 117*   < > 99 95* 104 105 109  CO2 26   < > 19* 20* 21* 24 22  GLUCOSE 145*   < > 454* 469* 265* 163* 192*  BUN 46*   < > 33* 31* 29* 31* 38*  CREATININE 0.90   < > 0.82 0.78 0.62 0.65 0.72  CALCIUM 7.8*   < > 7.1* 6.5* 6.9* 6.8* 7.0*  MG 2.2  --   --   --   --  1.3* 2.3  PHOS 3.6  --   --   --   --  4.9* 4.5   < > = values in this interval not displayed.   GFR: Estimated Creatinine Clearance: 90.4 mL/min (by C-G formula based on SCr of 0.72 mg/dL). Recent Labs  Lab 06/04/2023 1902 05/15/23 0119 05/15/23 0529 05/15/23 2020 05/16/23 0419 05/13/2023 0346  WBC 8.6 6.8  --  11.3* 14.7* 19.4*  LATICACIDVEN 3.2* 3.9* 4.0*  --   --   --     Liver Function Tests: Recent Labs  Lab 05/11/23 0639 05/13/23 0504  AST 91* 66*  ALT 98* 79*  ALKPHOS 153* 124  BILITOT 4.2* 4.9*  PROT 5.1* 5.0*  ALBUMIN 1.7* <1.5*   No results for input(s): "LIPASE", "AMYLASE" in the last 168 hours. No results for input(s): "AMMONIA" in the last 168 hours.  ABG    Component Value Date/Time   PHART  7.31 (L) 04/29/2023 0005   PCO2ART 44 04/29/2023 0005   PO2ART 95 04/29/2023 0005   HCO3 23.0 05/15/2023 0952   ACIDBASEDEF 5.2 (H) 05/15/2023 0952   O2SAT 65.2 05/15/2023 0952     Coagulation Profile: Recent Labs  Lab 05/11/23 0639 05/16/2023 1644 05/15/2023 1940 05/15/23 0529 05/16/23 0419  INR 1.3* 1.9* 1.9* 1.7* 1.7*    Cardiac Enzymes: No results for input(s): "CKTOTAL", "CKMB", "CKMBINDEX", "TROPONINI" in the last 168 hours.  HbA1C: Hgb A1c MFr Bld  Date/Time Value Ref Range Status  04/02/2023 08:56 AM 6.8 (H) 4.8 - 5.6 % Final    Comment:    (NOTE) Pre diabetes:  05/02/23: hypernatremic, inflammatory markers up (ferritin high, ALP high), still septic/SIRS, escalating abx, repeat CXR, very cautious fluid administration to help reduce sodium and if increased SOB would  d/c fluids and repeat CXR, increased insulin, repeat albumin  05/03/23: Tachycardia has improved, still tachypnea but no longer meeting sepsis/sirs criteria. Sodium about same 152, Hgb stable. Relatively low UOsm 444 c/w osmotic diuresis (get Glc controlled) / loop diuretics (d/c Lasix) / ADH resistance (correct potassium though hypokalemia is not severe, protein malnutrition is most likely greater factor). Gentle hydration and will need to d/w dietary/pharmacy but malnutrition is going to be a persistent issue. Still substantial fluid loss via NG 05/04/23: overnight, increased confusion, temp 100.9-101.1, tachycardic, wound vac drainage increased, transferred back to stepdown, switch cefepime + flagyl to Zosyn, continue vanc, follow BCx, neuro-checks, given lasix, CT head no acute process, CT abd/pel no abscess abdominal and multifocal lung infiltrate, CTA chest to r/o PE / eval pneumonia/effusion --> no PE, (+)pulmonary edema. WIll hold fluids and try diuresis tonight, hopefully sodium and BP will hold, signed otu to night team in ICU in case needing pressors 05/05/23: stable this morning, sodium slightly better, HH worse, will monitor closely, n oUOP documentd, follow  closely .  05/13/23: Sodium improved, remains on IV Unasyn 009/20/2024: Pt developed septic shock requiring levophed gtt.  CT Abd/Pelvis revealed some contrast  extravasation.  Due to general surgeries concern for possible missed bowel injury or other viscus, pt taken to OR for ex-LAP (see post op note) Pt remained mechanically intubated postop PCCM re-consulted  09/07: Overnight required 1 unit PRBCs trnsfusion, Heparin held. Remain on the vent, sedated and on pressors. Plan for washout tomorrow 09/08: Required vec overnight for vent dyssynchrony. Plan for washout this am  Significant Diagnostic Tests:  CXR 04/04/23: low lung volumes with stable small L pleural effusion CT chest/ abdomen/ pelvis w contrast 04/01/23: Large anterior abdominal  wound or defect with oral contrast pooling along the external surface. 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 Small left pleural effusion with compressive atelectasis in the left lower lobe. Hazy parenchymal densities in both lungs are similar to the previous examination and nonspecific. Findings could be associated with atelectasis or post inflammatory changes. Small ventral hernias along the left side of the abdominal wound/defect. There is a loop of decompressed small bowel associated with these small ventral hernias. Small amount of fluid in the abdomen is new. There is small focal collection adjacent to the proximal stomach that measures up to 2.3 cm. Chronic vertebral body compression fracture involving L1.  Interim History / Subjective:  REMAINS ON PRESSORS REMAINS ON VENT SEVERE ABDOMINAL CATASTROPHE DUE TO BOWEL PERF PROLONGED HOSPITAL COURSE VERY POOR CHANCE OF QUALITY OF LIFE  Objective   Blood pressure (!) 122/55, pulse 74, temperature (!) 97 F (36.1 C), temperature source Axillary, resp. rate (!) 24, height 4\' 11"  (1.499 m), weight 124.1 kg, SpO2 94%.    Vent Mode: PRVC FiO2 (%):  [40 %-70 %] 50 % Set Rate:  [24 bmp] 24 bmp Vt Set:  [360 mL] 360 mL PEEP:  [5 cmH20] 5 cmH20 Plateau Pressure:  [32 cmH20-34 cmH20] 32 cmH20   Intake/Output Summary (Last 24 hours) at 05/11/2023 0647 Last data filed at 06/06/2023 0600 Gross per 24 hour  Intake 6318.23 ml  Output 4460 ml  Net 1858.23 ml   Filed Weights   05/13/23 0416 05/16/23 0157 06/06/2023 0421  Weight: 104.1  Status:  Discontinued        2 g 200 mL/hr over 30 Minutes Intravenous Every 8 hours 05/02/23 1530 05/04/23 0236   05/02/23 1630  vancomycin (VANCOREADY) IVPB 2000 mg/400 mL        2,000 mg 200 mL/hr over 120 Minutes Intravenous  Once 05/02/23 1530 05/02/23 1746   05/02/23 1600  metroNIDAZOLE (FLAGYL) IVPB 500 mg  Status:  Discontinued        500 mg 100 mL/hr over 60 Minutes Intravenous Every 12 hours 05/02/23 1453 05/04/23 0236   05/01/23 1400  azithromycin (ZITHROMAX) 500 mg in  sodium chloride 0.9 % 250 mL IVPB  Status:  Discontinued        500 mg 250 mL/hr over 60 Minutes Intravenous Every 24 hours 05/01/23 1255 05/02/23 1453   05/01/23 1230  azithromycin (ZITHROMAX) tablet 500 mg  Status:  Discontinued        500 mg Oral Daily 05/01/23 1135 05/01/23 1255   05/01/23 1200  cefTRIAXone (ROCEPHIN) 2 g in sodium chloride 0.9 % 100 mL IVPB  Status:  Discontinued        2 g 200 mL/hr over 30 Minutes Intravenous Every 24 hours 05/01/23 1135 05/02/23 1453   04/24/2023 2200  cefoTEtan (CEFOTAN) 2 g in sodium chloride 0.9 % 100 mL IVPB        2 g 200 mL/hr over 30 Minutes Intravenous Every 8 hours 04/09/2023 2058 04/29/23 1717   04/27/23 0945  cefoTEtan (CEFOTAN) 2 g in sodium chloride 0.9 % 100 mL IVPB        2 g 200 mL/hr over 30 Minutes Intravenous On call to O.R. 04/27/23 0856 04/18/2023 0559   04/05/23 0900  cefTRIAXone (ROCEPHIN) 2 g in sodium chloride 0.9 % 100 mL IVPB        2 g 200 mL/hr over 30 Minutes Intravenous Every 24 hours 04/05/23 0634 04/14/23 1254   12/16/2022 1000  ceFAZolin (ANCEF) IVPB 2g/100 mL premix        2 g 200 mL/hr over 30 Minutes Intravenous  Once 12/18/22 1738 12/28/2022 1141   09/17/22 1200  vancomycin (VANCOREADY) IVPB 1250 mg/250 mL  Status:  Discontinued        1,250 mg 166.7 mL/hr over 90 Minutes Intravenous Every 24 hours 09/16/22 1040 09/22/22 1139   09/16/22 1200  vancomycin (VANCOREADY) IVPB 1250 mg/250 mL        1,250 mg 166.7 mL/hr over 90 Minutes Intravenous  Once 09/15/22 1706 09/16/22 1255   09/15/22 1200  ceFEPIme (MAXIPIME) 2 g in sodium chloride 0.9 % 100 mL IVPB  Status:  Discontinued        2 g 200 mL/hr over 30 Minutes Intravenous Every 8 hours 09/15/22 1024 09/18/22 1026   09/15/22 1200  vancomycin (VANCOREADY) IVPB 2000 mg/400 mL        2,000 mg 200 mL/hr over 120 Minutes Intravenous  Once 09/15/22 1024 09/15/22 1500   03/04/22 2200  piperacillin-tazobactam (ZOSYN) IVPB 3.375 g        3.375 g 12.5 mL/hr over 240 Minutes  Intravenous Every 8 hours 03/04/22 2005 03/18/22 0248   02/21/22 1500  vancomycin (VANCOREADY) IVPB 1250 mg/250 mL  Status:  Discontinued        1,250 mg 166.7 mL/hr over 90 Minutes Intravenous Every 24 hours 02/21/22 1111 02/24/22 1336   02/21/22 1400  meropenem (MERREM) 1 g in sodium chloride 0.9 % 100 mL IVPB        1 g 200  5.7%-6.4%  Diabetes:              >6.4%  Glycemic control for   <7.0% adults with diabetes     CBG: Recent Labs  Lab 05/16/23 2225 05/10/2023 0019 05/26/2023 0123 05/27/2023 0519 05/16/2023 0621  GLUCAP 179* 181* 184* 179* 173*    Review of Systems:   UTA patient intubated and sedated unable to participate in interview at this time  Past Medical History:  She,  has a past medical history of Anemia, Colon cancer (HCC) (04/2020), History of kidney stones, Hypothyroidism, and Thyroid disease.   Surgical History:   Past Surgical History:  Procedure Laterality Date   ABDOMINAL WALL DEFECT REPAIR N/A 02/20/2022   Procedure: REPAIR ABDOMINAL WALL;  Surgeon: Leafy Ro, MD;  Location: [facility];  Service: General;  Laterality: N/A;   ABDOMINAL WALL DEFECT REPAIR N/A 04/14/2023   Procedure: Reconstruction of ABDOMINAL WALL;  Surgeon: Leafy Ro, MD;  Location: [facility];  Service: General;  Laterality: N/A;   APPENDECTOMY N/A 02/14/2022   Procedure: APPENDECTOMY;  Surgeon: Leafy Ro, MD;  Location: [facility];  Service: General;  Laterality: N/A;   APPLICATION OF WOUND VAC N/A 03/02/2022   Procedure: APPLICATION OF WOUND VAC;  Surgeon: Leafy Ro, MD;  Location: [facility];  Service: General;  Laterality: N/A;  [id number]   APPLICATION OF WOUND VAC  06/04/2023   Procedure: APPLICATION OF WOUND  VAC;  Surgeon: Leafy Ro, MD;  Location: [facility];  Service: General;;  Tawni Pummel Wound VAC application   BOWEL RESECTION N/A 04/27/2023   Procedure: SMALL BOWEL RESECTION;  Surgeon: Leafy Ro, MD;  Location: [facility];  Service: General;  Laterality: N/A;   BOWEL RESECTION N/A 06/06/2023   Procedure: SMALL BOWEL RESECTION;  Surgeon: Leafy Ro, MD;  Location: [facility];  Service: General;  Laterality: N/A;  small bowel resection   and resectionof small bowel anastamosis   COLON RESECTION SIGMOID N/A 02/07/2022   Procedure: COLON RESECTION SIGMOID, open, [name], PA-C to assist;  Surgeon: Leafy Ro, MD;  Location: [facility];  Service: General;  Laterality: N/A;   COLONOSCOPY WITH PROPOFOL N/A 11/21/2019   Procedure: COLONOSCOPY WITH PROPOFOL;  Surgeon: Toney Reil, MD;  Location: [facility];  Service: Gastroenterology;  Laterality: N/A;   COLONOSCOPY WITH PROPOFOL N/A 07/10/2021   Procedure: COLONOSCOPY WITH PROPOFOL;  Surgeon: Toney Reil, MD;  Location: [facility] ENDOSCOPY;  Service: Gastroenterology;  Laterality: N/A;   COLONOSCOPY WITH PROPOFOL N/A 02/06/2022   Procedure: COLONOSCOPY WITH PROPOFOL;  Surgeon: Toney Reil, MD;  Location: [facility] ENDOSCOPY;  Service: Gastroenterology;  Laterality: N/A;  SPANISH   COLOSTOMY  05/16/2023   Procedure: COLOSTOMY;  Surgeon: Leafy Ro, MD;  Location: [facility];  Service: General;;  End Colostomy   CYSTOSCOPY WITH STENT PLACEMENT Left 01/30/2020   Procedure: CYSTOSCOPY WITH STENT PLACEMENT;  Surgeon: Vanna Scotland, MD;  Location: [facility];  Service: Urology;  Laterality: Left;   ESOPHAGOGASTRODUODENOSCOPY (EGD) WITH PROPOFOL N/A 11/21/2019   Procedure: ESOPHAGOGASTRODUODENOSCOPY (EGD) WITH PROPOFOL;  Surgeon: Toney Reil, MD;  Location: [facility] ENDOSCOPY;  Service: Gastroenterology;  Laterality: N/A;   EXCISION OF MESH  05/23/2023   Procedure: EXCISION OF MESH;  Surgeon: Leafy Ro, MD;  Location: [facility];   Service: General;;  excision of abdominal mesh   ILEOSTOMY  05/13/2023   Procedure: ILEOSTOMY;  Surgeon: Leafy Ro, MD;  Location: [facility];  Service: General;;  double barrel  Status:  Discontinued        2 g 200 mL/hr over 30 Minutes Intravenous Every 8 hours 05/02/23 1530 05/04/23 0236   05/02/23 1630  vancomycin (VANCOREADY) IVPB 2000 mg/400 mL        2,000 mg 200 mL/hr over 120 Minutes Intravenous  Once 05/02/23 1530 05/02/23 1746   05/02/23 1600  metroNIDAZOLE (FLAGYL) IVPB 500 mg  Status:  Discontinued        500 mg 100 mL/hr over 60 Minutes Intravenous Every 12 hours 05/02/23 1453 05/04/23 0236   05/01/23 1400  azithromycin (ZITHROMAX) 500 mg in  sodium chloride 0.9 % 250 mL IVPB  Status:  Discontinued        500 mg 250 mL/hr over 60 Minutes Intravenous Every 24 hours 05/01/23 1255 05/02/23 1453   05/01/23 1230  azithromycin (ZITHROMAX) tablet 500 mg  Status:  Discontinued        500 mg Oral Daily 05/01/23 1135 05/01/23 1255   05/01/23 1200  cefTRIAXone (ROCEPHIN) 2 g in sodium chloride 0.9 % 100 mL IVPB  Status:  Discontinued        2 g 200 mL/hr over 30 Minutes Intravenous Every 24 hours 05/01/23 1135 05/02/23 1453   04/24/2023 2200  cefoTEtan (CEFOTAN) 2 g in sodium chloride 0.9 % 100 mL IVPB        2 g 200 mL/hr over 30 Minutes Intravenous Every 8 hours 04/09/2023 2058 04/29/23 1717   04/27/23 0945  cefoTEtan (CEFOTAN) 2 g in sodium chloride 0.9 % 100 mL IVPB        2 g 200 mL/hr over 30 Minutes Intravenous On call to O.R. 04/27/23 0856 04/18/2023 0559   04/05/23 0900  cefTRIAXone (ROCEPHIN) 2 g in sodium chloride 0.9 % 100 mL IVPB        2 g 200 mL/hr over 30 Minutes Intravenous Every 24 hours 04/05/23 0634 04/14/23 1254   12/16/2022 1000  ceFAZolin (ANCEF) IVPB 2g/100 mL premix        2 g 200 mL/hr over 30 Minutes Intravenous  Once 12/18/22 1738 12/28/2022 1141   09/17/22 1200  vancomycin (VANCOREADY) IVPB 1250 mg/250 mL  Status:  Discontinued        1,250 mg 166.7 mL/hr over 90 Minutes Intravenous Every 24 hours 09/16/22 1040 09/22/22 1139   09/16/22 1200  vancomycin (VANCOREADY) IVPB 1250 mg/250 mL        1,250 mg 166.7 mL/hr over 90 Minutes Intravenous  Once 09/15/22 1706 09/16/22 1255   09/15/22 1200  ceFEPIme (MAXIPIME) 2 g in sodium chloride 0.9 % 100 mL IVPB  Status:  Discontinued        2 g 200 mL/hr over 30 Minutes Intravenous Every 8 hours 09/15/22 1024 09/18/22 1026   09/15/22 1200  vancomycin (VANCOREADY) IVPB 2000 mg/400 mL        2,000 mg 200 mL/hr over 120 Minutes Intravenous  Once 09/15/22 1024 09/15/22 1500   03/04/22 2200  piperacillin-tazobactam (ZOSYN) IVPB 3.375 g        3.375 g 12.5 mL/hr over 240 Minutes  Intravenous Every 8 hours 03/04/22 2005 03/18/22 0248   02/21/22 1500  vancomycin (VANCOREADY) IVPB 1250 mg/250 mL  Status:  Discontinued        1,250 mg 166.7 mL/hr over 90 Minutes Intravenous Every 24 hours 02/21/22 1111 02/24/22 1336   02/21/22 1400  meropenem (MERREM) 1 g in sodium chloride 0.9 % 100 mL IVPB        1 g 200  Status:  Discontinued        2 g 200 mL/hr over 30 Minutes Intravenous Every 8 hours 05/02/23 1530 05/04/23 0236   05/02/23 1630  vancomycin (VANCOREADY) IVPB 2000 mg/400 mL        2,000 mg 200 mL/hr over 120 Minutes Intravenous  Once 05/02/23 1530 05/02/23 1746   05/02/23 1600  metroNIDAZOLE (FLAGYL) IVPB 500 mg  Status:  Discontinued        500 mg 100 mL/hr over 60 Minutes Intravenous Every 12 hours 05/02/23 1453 05/04/23 0236   05/01/23 1400  azithromycin (ZITHROMAX) 500 mg in  sodium chloride 0.9 % 250 mL IVPB  Status:  Discontinued        500 mg 250 mL/hr over 60 Minutes Intravenous Every 24 hours 05/01/23 1255 05/02/23 1453   05/01/23 1230  azithromycin (ZITHROMAX) tablet 500 mg  Status:  Discontinued        500 mg Oral Daily 05/01/23 1135 05/01/23 1255   05/01/23 1200  cefTRIAXone (ROCEPHIN) 2 g in sodium chloride 0.9 % 100 mL IVPB  Status:  Discontinued        2 g 200 mL/hr over 30 Minutes Intravenous Every 24 hours 05/01/23 1135 05/02/23 1453   04/24/2023 2200  cefoTEtan (CEFOTAN) 2 g in sodium chloride 0.9 % 100 mL IVPB        2 g 200 mL/hr over 30 Minutes Intravenous Every 8 hours 04/09/2023 2058 04/29/23 1717   04/27/23 0945  cefoTEtan (CEFOTAN) 2 g in sodium chloride 0.9 % 100 mL IVPB        2 g 200 mL/hr over 30 Minutes Intravenous On call to O.R. 04/27/23 0856 04/18/2023 0559   04/05/23 0900  cefTRIAXone (ROCEPHIN) 2 g in sodium chloride 0.9 % 100 mL IVPB        2 g 200 mL/hr over 30 Minutes Intravenous Every 24 hours 04/05/23 0634 04/14/23 1254   12/16/2022 1000  ceFAZolin (ANCEF) IVPB 2g/100 mL premix        2 g 200 mL/hr over 30 Minutes Intravenous  Once 12/18/22 1738 12/28/2022 1141   09/17/22 1200  vancomycin (VANCOREADY) IVPB 1250 mg/250 mL  Status:  Discontinued        1,250 mg 166.7 mL/hr over 90 Minutes Intravenous Every 24 hours 09/16/22 1040 09/22/22 1139   09/16/22 1200  vancomycin (VANCOREADY) IVPB 1250 mg/250 mL        1,250 mg 166.7 mL/hr over 90 Minutes Intravenous  Once 09/15/22 1706 09/16/22 1255   09/15/22 1200  ceFEPIme (MAXIPIME) 2 g in sodium chloride 0.9 % 100 mL IVPB  Status:  Discontinued        2 g 200 mL/hr over 30 Minutes Intravenous Every 8 hours 09/15/22 1024 09/18/22 1026   09/15/22 1200  vancomycin (VANCOREADY) IVPB 2000 mg/400 mL        2,000 mg 200 mL/hr over 120 Minutes Intravenous  Once 09/15/22 1024 09/15/22 1500   03/04/22 2200  piperacillin-tazobactam (ZOSYN) IVPB 3.375 g        3.375 g 12.5 mL/hr over 240 Minutes  Intravenous Every 8 hours 03/04/22 2005 03/18/22 0248   02/21/22 1500  vancomycin (VANCOREADY) IVPB 1250 mg/250 mL  Status:  Discontinued        1,250 mg 166.7 mL/hr over 90 Minutes Intravenous Every 24 hours 02/21/22 1111 02/24/22 1336   02/21/22 1400  meropenem (MERREM) 1 g in sodium chloride 0.9 % 100 mL IVPB        1 g 200

## 2023-05-17 NOTE — Anesthesia Preprocedure Evaluation (Addendum)
Anesthesia Evaluation  Patient identified by MRN, date of birth, ID band Patient awake  General Assessment Comment:Patient in critical condition in ICU. Intubated, sedated, paralyzed. Requiring 58mcg/min levophed. A line in place   Reviewed: Allergy & Precautions, NPO status , Patient's Chart, lab work & pertinent test results  History of Anesthesia Complications Negative for: history of anesthetic complications  Airway Mallampati: Intubated       Dental  (+) Teeth Intact   Pulmonary neg pulmonary ROS   Pulmonary exam normal        Cardiovascular negative cardio ROS Normal cardiovascular exam     Neuro/Psych negative neurological ROS  negative psych ROS   GI/Hepatic negative GI ROS, Neg liver ROS,,,  Endo/Other  Hypothyroidism  Morbid obesity  Renal/GU      Musculoskeletal   Abdominal   Peds  Hematology  (+) Blood dyscrasia, anemia   Anesthesia Other Findings Past Medical History: No date: Anemia 04/2020: Colon cancer (HCC) No date: History of kidney stones No date: Hypothyroidism No date: Thyroid disease  Past Surgical History: 02/08/2022: ABDOMINAL WALL DEFECT REPAIR; N/A     Comment:  Procedure: REPAIR ABDOMINAL WALL;  Surgeon: Leafy Ro, MD;  Location: [facility];  Service: General;                Laterality: N/A; 30-Apr-2023: ABDOMINAL WALL DEFECT REPAIR; N/A     Comment:  Procedure: Reconstruction of ABDOMINAL WALL;  Surgeon:               Leafy Ro, MD;  Location: [facility];  Service:               General;  Laterality: N/A; 02/11/2022: APPENDECTOMY; N/A     Comment:  Procedure: APPENDECTOMY;  Surgeon: Leafy Ro, MD;                Location: [facility];  Service: General;  Laterality: N/A; 02/19/2022: APPLICATION OF WOUND VAC; N/A     Comment:  Procedure: APPLICATION OF WOUND VAC;  Surgeon: Leafy Ro, MD;  Location: [facility];  Service: General;                 Laterality: N/A;  [id number] 06/03/2023: APPLICATION OF WOUND VAC     Comment:  Procedure: APPLICATION OF WOUND VAC;  Surgeon: Leafy Ro, MD;  Location: [facility];  Service: General;;                Abthera Wound VAC application 2023-04-30: BOWEL RESECTION; N/A     Comment:  Procedure: SMALL BOWEL RESECTION;  Surgeon: Leafy Ro, MD;  Location: [facility];  Service: General;                Laterality: N/A; 05/12/2023: BOWEL RESECTION; N/A     Comment:  Procedure: SMALL BOWEL RESECTION;  Surgeon: Leafy Ro, MD;  Location: [facility];  Service: General;                Laterality: N/A;  small bowel resection   and resectionof  small bowel anastamosis 02/07/2022: COLON RESECTION SIGMOID; N/A     Comment:  Procedure: COLON RESECTION SIGMOID, open, [name], PA-C to assist;  Surgeon: Leafy Ro, MD;                Location: [facility];  Service: General;  Laterality: N/A; 11/21/2019: COLONOSCOPY WITH PROPOFOL; N/A     Comment:  Procedure: COLONOSCOPY WITH PROPOFOL;  Surgeon: Toney Reil, MD;  Location: [facility];  Service:               Gastroenterology;  Laterality: N/A; 07/10/2021: COLONOSCOPY WITH PROPOFOL; N/A     Comment:  Procedure: COLONOSCOPY WITH PROPOFOL;  Surgeon: Toney Reil, MD;  Location: [facility];  Service:               Gastroenterology;  Laterality: N/A; 02/06/2022: COLONOSCOPY WITH PROPOFOL; N/A     Comment:  Procedure: COLONOSCOPY WITH PROPOFOL;  Surgeon: Toney Reil, MD;  Location: [facility];  Service:               Gastroenterology;  Laterality: N/A;  SPANISH 2023/06/05: COLOSTOMY     Comment:  Procedure: COLOSTOMY;  Surgeon: Leafy Ro, MD;                Location: [facility];  Service: General;;  End Colostomy 01/30/2020: CYSTOSCOPY WITH STENT PLACEMENT; Left     Comment:  Procedure: CYSTOSCOPY WITH STENT PLACEMENT;   Surgeon:               Vanna Scotland, MD;  Location: [facility];  Service:               Urology;  Laterality: Left; 11/21/2019: ESOPHAGOGASTRODUODENOSCOPY (EGD) WITH PROPOFOL; N/A     Comment:  Procedure: ESOPHAGOGASTRODUODENOSCOPY (EGD) WITH               PROPOFOL;  Surgeon: Toney Reil, MD;  Location:               [facility];  Service: Gastroenterology;  Laterality:               N/A; 06-05-2023: EXCISION OF MESH     Comment:  Procedure: EXCISION OF MESH;  Surgeon: Leafy Ro,               MD;  Location: [facility];  Service: General;;  excision of              abdominal mesh 05-Jun-2023: ILEOSTOMY     Comment:  Procedure: ILEOSTOMY;  Surgeon: Leafy Ro, MD;                Location: [facility];  Service: General;;  double barrel 03/07/2022: INSERTION OF MESH; N/A     Comment:  Procedure: INSERTION OF MESH;  Surgeon: Leafy Ro,               MD;  Location: [facility];  Service: General;  Laterality:               N/A; 02/08/2022: INSERTION OF MESH; N/A     Comment:  Procedure: INSERTION OF MESH;  Surgeon: Sterling Big  F,               MD;  Location: [facility];  Service: General;  Laterality:               N/A; 07/03/2020: IR RADIOLOGIST EVAL & MGMT 07/12/2020: IR RADIOLOGIST EVAL & MGMT 25-Feb-2022: LAPAROTOMY; N/A     Comment:  Procedure: EXPLORATORY LAPAROTOMY REPAIR OF BOWEL               PERFORATION;  Surgeon: Leafy Ro, MD;  Location:               [facility];  Service: General;  Laterality: N/A; 04/17/2023: LAPAROTOMY; N/A     Comment:  Procedure: EXPLORATORY LAPAROTOMY; POSSIBLE SMALL BOWEL               RESECTION;  Surgeon: Leafy Ro, MD;  Location: [facility]               [facility];  Service: General;  Laterality: N/A; 05/29/2023: LAPAROTOMY; N/A     Comment:  Procedure: EXPLORATORY LAPAROTOMY;  Surgeon: Leafy Ro, MD;  Location: [facility];  Service: General;                Laterality: N/A; 01/30/2020: PARTIAL COLECTOMY; N/A     Comment:   Procedure: PARTIAL COLECTOMY Sigmoid;  Surgeon: Duanne Guess, MD;  Location: [facility];  Service: General;                Laterality: N/A; 03/28/2020: PORTACATH PLACEMENT; Left     Comment:  Procedure: INSERTION PORT-A-CATH;  Surgeon: Duanne Guess, MD;  Location: [facility];  Service: General;                Laterality: Left; 08/29/2020: PORTACATH PLACEMENT; N/A     Comment:  Procedure: INSERTION PORT-A-CATH, chemotherapy;                Surgeon: Duanne Guess, MD;  Location: [facility];                Service: General;  Laterality: N/A; 12/27/2022: PORTACATH PLACEMENT; N/A     Comment:  Procedure: INSERTION PORT-A-CATH;  Surgeon: Leafy Ro, MD;  Location: [facility];  Service: General;                Laterality: N/A; 03/01/2022: VENTRAL HERNIA REPAIR; N/A     Comment:  Procedure: HERNIA REPAIR VENTRAL ADULT, open, [name], PA-C to assist;  Surgeon: Leafy Ro, MD;                Location: [facility];  Service: General;  Laterality: N/A;  BMI    Body Mass Index: 55.26 kg/m      Reproductive/Obstetrics negative OB ROS                             Anesthesia Physical Anesthesia Plan  ASA: 4  Anesthesia Plan: General ETT   Post-op Pain Management: Toradol IV (intra-op)* and Ofirmev IV (intra-op)*   Induction: Intravenous  PONV Risk Score  and Plan: Ondansetron, Treatment may vary due to age or medical condition, Dexamethasone and Midazolam  Airway Management Planned: Oral ETT  Additional Equipment: Arterial line  Intra-op Plan:   Post-operative Plan: Post-operative intubation/ventilation  Informed Consent: I have reviewed the patients History and Physical, chart, labs and discussed the procedure including the risks, benefits and alternatives for the proposed anesthesia with the patient or authorized representative who has indicated his/her understanding and acceptance.      Dental Advisory Given and Consent reviewed with POA  Plan Discussed with: Anesthesiologist, CRNA and Surgeon  Anesthesia Plan Comments: (Patient consented for risks of anesthesia including but not limited to:  - adverse reactions to medications - damage to eyes, teeth, lips or other oral mucosa - nerve damage due to positioning  - sore throat or hoarseness - Damage to heart, brain, nerves, lungs, other parts of body or loss of life  Patient voiced understanding.)       Anesthesia Quick Evaluation

## 2023-05-17 NOTE — Progress Notes (Signed)
PHARMACY - TOTAL PARENTERAL NUTRITION CONSULT NOTE   Indication: Prolonged ileus  Patient Measurements: Height: 4\' 11"  (149.9 cm) Weight: 124.1 kg (273 lb 9.5 oz) IBW/kg (Calculated) : 43.2 TPN AdjBW (KG): 59 Body mass index is 55.26 kg/m.  Assessment: Debra [name] is a 57 y.o. female s/p laparotomy, excision of greater omental mass, abdominal wall reconstruction with [name], appendectomy, and placement of Prevena vac.  Glucose / Insulin:  BG last 24h: 173-192 SSI last 24h: now on insulin gtt  Electrolytes: electrolytes WNL. 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, partial gastrectomy, abdominal wall reconstruction with mesh placement, and wound vac placement  05/14/23  takeback exploratory laparotomy, excision of mesh, small bowel resection, creation of end colostomy/mucus fistula, double barrel ileostomy   Central access: PICC 04/29/2023   TPN start date: 05/01/2023 TPN from 02/22/2022 to 04/05/2023 - held for bacteremia and TPN restarted 04/17/2023 Previously Cyclic TPN, transitioned back to regular TPN on 04/29/23 post surgery  RD Assessment:  Estimated Needs Total Energy Estimated Needs: 1900-2200kcal/day Total Protein Estimated Needs: 95-110g/day Total Fluid Estimated Needs: 1.4-1.6L/day  Current Nutrition: NPO  Plan:  --Continue TPN at new goal rate of 65 mL/hr --Electrolytes in TPN: Na 10 mEq/L,  K 39mEq/L, Ca 10mEq/L, Mg 5 mEq/L, and Phos 5 mmol/L(decreased on 9/7, elevated phos) --Acetate maximized  -- TG trending down.  --Continue multivitamin, trace elements, zinc, selenium and chromium chloride to TPN  --Monitor TPN labs daily until stable then on Mon/Thurs  Ronnald Ramp, PharmD, BCPS 06/06/2023 9:50 AM

## 2023-05-17 NOTE — Progress Notes (Signed)
Multiple family members rotating in and out- Chaplain paged, introduced chaplain services and offered compassionate presence and listening ear. No needs where shared at this time but family appreciated the visit, they shared they are just beginning to process as a family and need time. Let family know chaplain services available 24/7 and if they change their minds staff knows how to reach [name]

## 2023-05-17 NOTE — Progress Notes (Signed)
Preoperative Review   Patient seen and examined. For Abd washout today, The history is reviewed in the chart . I personally reviewed the options and rationale as well as the risks of this procedure that have been previously discussed with the family. All questions asked by the family were answered to their satisfaction. Family agrees to proceed with this procedure at this time.  Sterling Big M.D. FACS

## 2023-05-17 NOTE — Op Note (Addendum)
PROCEDURES: Reopening of laparotomy w Removal of Abthera Repair enterotomy Placement of abthera wound VAC 35x 20 cms  Pre-operative Diagnosis: Open abdomen, abdominal catastrophe  Post-operative Diagnosis: same  Surgeon: Merri Ray Manhattan Mccuen    Anesthesia: General endotracheal anesthesia  ASA Class: 4   Surgeon: Sterling Big , MD FACS  Anesthesia: Gen. with endotracheal tube  Findings:  Estimated Blood Loss: 10cc          Complications: none               Condition: critical  Procedure Details  The patient was seen again in the Holding Room. The benefits, complications, treatment options, and expected outcomes were discussed with the family. The risks of bleeding, infection, recurrence of symptoms, failure to resolve symptoms,  bowel injury, and death She is critically ill and worsening over the last 24 hrs.   The patient was taken to Operating Room, identified as Makhari Despirito and the procedure verified.  A Time Out was held and the above information confirmed.  Prior to the induction of general anesthesia, antibiotic prophylaxis was administered.  He remained critically ill during the operation.  Anesthesia was unable to place her completely supine due to her respiratory failure and hypoxemia.  She was satting about 85% on 100% FiO2.   She was also on Levophed during the entire case at very high doses We had to do the washout using the hospital bed because she was so frail that she could not be transported to an operating room table. I went ahead and removed the wound VAC.  The abdomen was prepped and draped with Betadine solution. Inspection revealed a new enterotomy that was presumably proximal to the diverting ileostomy, due to the significant inflammatory response I was not able to run the bowel and confirmed that the perforation was proximal to the ileostomy.  Using 2-0 silk's I repair the perforation in a single fashion.  Her tissues were very frail and friable.  I am not  sure if any of these sutures will hold.  Her tissues were pretty much melting. I also inspected the end ileostomy and seem to be in decent shape.  However the distal end of the double barrel ileostomy has some ischemic changes. Domino cavity was irrigated with warm saline.  I replaced the ABThera VAC and there were no evidence of any leaks. The double barrel ileostomy also showed signs of ischemia. Needle and laparotomy counts were correct and there were no immediate complications  Prognosis is extremely poor and she has continued to deteriorate.  She continues to have enterotomies and is unlikely that she will survive. Updated the family in detail about operative findings and my thought process.  Sterling Big, MD, FACS

## 2023-05-17 NOTE — Transfer of Care (Signed)
Immediate Anesthesia Transfer of Care Note  Patient: Debra [name]  Procedure(s) Performed: IRRIGATION AND DEBRIDEMENT ABDOMEN- abdominal wash out (Abdomen) APPLICATION OF WOUND VAC  Patient Location: ICU  Anesthesia Type:General  Level of Consciousness: sedated and Patient remains intubated per anesthesia plan  Airway & Oxygen Therapy: Patient remains intubated per anesthesia plan  Post-op Assessment: Report given to RN and Post -op Vital signs reviewed and stable  Post vital signs: Reviewed and stable  Last Vitals:  Vitals Value Taken Time  BP    Temp    Pulse 95 06/07/2023 1340  Resp 0 05/29/2023 1340  SpO2 89 % 05/16/2023 1340  Vitals shown include unfiled device data.  Last Pain:  Vitals:   05/31/2023 1054  TempSrc: Axillary  PainSc:       Patients Stated Pain Goal: 0 (05/09/23 0553)  Complications: No notable events documented.

## 2023-05-17 NOTE — Consult Note (Signed)
Pharmacy Antibiotic Note  Debra [name] is a 57 y.o. female admitted on 02/12/2022 with  post-surgical prolonged hospitalization most recently with abdominal catastrophe admitted to the ICU on ventilator and requiring vasopressors .  Pharmacy has been consulted for vancomycin dosing. Patient is also ordered Zosyn & micafungin.  Plan:  Vancomycin 2 g IV LD followed by maintenance regimen of vancomycin 1.5 g IV q24h --Calculated AUC: 511.5, Cmin: 12.3 --Scr 0.8, IBW, Vd 0.5 --Daily Scr per protocol --Levels at steady state or as clinically indicated  Height: 4\' 11"  (149.9 cm) Weight: 124.1 kg (273 lb 9.5 oz) IBW/kg (Calculated) : 43.2  Temp (24hrs), Avg:97.3 F (36.3 C), Min:97 F (36.1 C), Max:97.7 F (36.5 C)  Recent Labs  Lab 06/07/2023 1644 06/02/2023 1902 05/15/23 0119 05/15/23 0529 05/15/23 2020 05/16/23 0419 05/22/2023 0346  WBC 12.7* 8.6 6.8  --  11.3* 14.7* 19.4*  CREATININE 0.82 0.78  --  0.62  --  0.65 0.72  LATICACIDVEN  --  3.2* 3.9* 4.0*  --   --   --     Estimated Creatinine Clearance: 90.4 mL/min (by C-G formula based on SCr of 0.72 mg/dL).    No Known Allergies  Antimicrobials this admission: See chart / prior documentation  Dose adjustments this admission: N/A  Microbiology results: Respiratory culture pending. Last blood cultures from 8/26 were negative  Thank you for allowing pharmacy to be a part of this patient's care.  [name] 05/16/2023 2:15 PM

## 2023-05-17 NOTE — Progress Notes (Signed)
Family meeting held at bedside with sisters and two daughter Alroy Bailiff and Vernona Rieger) . I explained in detail about her course and recent events. She is actively dying. Requiring increasing vent and circulatory support.  They have Agreed that the humane thing to do is not to do any resuscitation. She will be DNR and no escalation of care regarding vasopressors. May do IVF , antibiotics and meds.

## 2023-05-17 NOTE — Plan of Care (Signed)
  Problem: Clinical Measurements: Goal: Ability to maintain clinical measurements within normal limits will improve Outcome: Not Progressing Goal: Will remain free from infection Outcome: Progressing Goal: Diagnostic test results will improve Outcome: Progressing Goal: Respiratory complications will improve Outcome: Not Progressing   Problem: Clinical Measurements: Goal: Will remain free from infection Outcome: Progressing   Problem: Nutrition: Goal: Adequate nutrition will be maintained Outcome: Progressing   Problem: Activity: Goal: Risk for activity intolerance will decrease Outcome: Not Progressing   Problem: Nutrition: Goal: Adequate nutrition will be maintained Outcome: Progressing   Problem: Coping: Goal: Level of anxiety will decrease Outcome: Not Applicable   Problem: Pain Managment: Goal: General experience of comfort will improve Outcome: Progressing   Problem: Activity: Goal: Ability to tolerate increased activity will improve Outcome: Not Progressing   Problem: Respiratory: Goal: Ability to maintain a clear airway and adequate ventilation will improve Outcome: Progressing   Problem: Role Relationship: Goal: Method of communication will improve Outcome: Not Progressing   Problem: Education: Goal: Ability to describe self-care measures that may prevent or decrease complications (Diabetes Survival Skills Education) will improve Outcome: Not Applicable   Problem: Cardiac: Goal: Ability to maintain an adequate cardiac output will improve Outcome: Progressing   Problem: Health Behavior/Discharge Planning: Goal: Ability to manage health-related needs will improve Outcome: Not Applicable   Problem: Health Behavior/Discharge Planning: Goal: Ability to manage health-related needs will improve Outcome: Not Applicable   Problem: Fluid Volume: Goal: Ability to achieve a balanced intake and output will improve Outcome: Not Progressing   Problem:  Metabolic: Goal: Ability to maintain appropriate glucose levels will improve Outcome: Progressing   Problem: Nutritional: Goal: Maintenance of adequate nutrition will improve Outcome: Progressing Goal: Maintenance of adequate weight for body size and type will improve Outcome: Not Progressing   Problem: Urinary Elimination: Goal: Ability to achieve and maintain adequate renal perfusion and functioning will improve Outcome: Progressing   Problem: Respiratory: Goal: Will regain and/or maintain adequate ventilation Outcome: Progressing

## 2023-05-17 NOTE — OR Nursing (Signed)
1319 call ICU to inform nurse Shelly the patient was coming back to ICU.

## 2023-05-17 NOTE — Procedures (Signed)
Arterial Catheter Insertion Procedure Note  Debra [name]  [id number]  [birth year]  Date:05/15/2023  Time:12:35 AM    Provider Performing: Loraine Leriche    Procedure: Insertion of Arterial Line (11914) with US guidance (78295)   Indication(s) Blood pressure monitoring and/or need for frequent ABGs  Consent Unable to obtain consent due to emergent nature of procedure.  Anesthesia None  Time Out Verified patient identification, verified procedure, site/side was marked, verified correct patient position, special equipment/implants available, medications/allergies/relevant history reviewed, required imaging and test results available.  Sterile Technique Maximal sterile technique including full sterile barrier drape, hand hygiene, sterile gown, sterile gloves, mask, hair covering, sterile ultrasound probe cover (if used).  Procedure Description Area of catheter insertion was cleaned with chlorhexidine and draped in sterile fashion. With real-time ultrasound guidance an arterial catheter was placed into the left femoral artery.  Appropriate arterial tracings confirmed on monitor.    Complications/Tolerance None; patient tolerated the procedure well.  EBL Minimal  Specimen(s) None   Webb Silversmith, DNP, CCRN, FNP-C, AGACNP-BC Acute Care & Family Nurse Practitioner  [facility]  See Amion for personal pager PCCM on call pager [phone] until 7 am

## 2023-05-18 ENCOUNTER — Encounter: Payer: Self-pay | Admitting: Surgery

## 2023-05-18 LAB — PREPARE PLATELET PHERESIS: Unit division: 0

## 2023-05-18 LAB — GLUCOSE, CAPILLARY
Glucose-Capillary: 158 mg/dL — ABNORMAL HIGH (ref 70–99)
Glucose-Capillary: 129 mg/dL — ABNORMAL HIGH (ref 70–99)
Glucose-Capillary: 111 mg/dL — ABNORMAL HIGH (ref 70–99)
Glucose-Capillary: 201 mg/dL — ABNORMAL HIGH (ref 70–99)
Glucose-Capillary: 201 mg/dL — ABNORMAL HIGH (ref 70–99)
Glucose-Capillary: 212 mg/dL — ABNORMAL HIGH (ref 70–99)
Glucose-Capillary: 182 mg/dL — ABNORMAL HIGH (ref 70–99)
Glucose-Capillary: 177 mg/dL — ABNORMAL HIGH (ref 70–99)

## 2023-05-18 LAB — BPAM PLATELET PHERESIS
Blood Product Expiration Date: 202409082359
ISSUE DATE / TIME: 202409081045
Unit Type and Rh: 6200

## 2023-05-19 LAB — GLUCOSE, CAPILLARY
Glucose-Capillary: 150 mg/dL — ABNORMAL HIGH (ref 70–99)
Glucose-Capillary: 159 mg/dL — ABNORMAL HIGH (ref 70–99)
Glucose-Capillary: 174 mg/dL — ABNORMAL HIGH (ref 70–99)

## 2023-05-20 LAB — CULTURE, RESPIRATORY W GRAM STAIN

## 2023-05-25 ENCOUNTER — Encounter: Payer: Self-pay | Admitting: Surgery

## 2023-06-02 ENCOUNTER — Ambulatory Visit: Payer: Self-pay | Admitting: Surgery

## 2023-06-09 NOTE — Group Note (Deleted)
[facility] LCSW Group Therapy Note    Group Date: 05/20/2023 Start Time: 1415 End Time: 1500  Type of Therapy and Topic:  Group Therapy:  Overcoming Obstacles  Participation Level:  {B[facility] PARTICIPATION LEVEL:22264:::1}  Mood:  Description of Group:   In this group patients will be encouraged to explore what they see as obstacles to their own wellness and recovery. They will be guided to discuss their thoughts, feelings, and behaviors related to these obstacles. The group will process together ways to cope with barriers, with attention given to specific choices patients can make. Each patient will be challenged to identify changes they are motivated to make in order to overcome their obstacles. This group will be process-oriented, with patients participating in exploration of their own experiences as well as giving and receiving support and challenge from other group members.  Therapeutic Goals: 1. Patient will identify personal and current obstacles as they relate to admission. 2. Patient will identify barriers that currently interfere with their wellness or overcoming obstacles.  3. Patient will identify feelings, thought process and behaviors related to these barriers. 4. Patient will identify two changes they are willing to make to overcome these obstacles:    Summary of Patient Progress   ***   Therapeutic Modalities:   Cognitive Behavioral Therapy Solution Focused Therapy Motivational Interviewing Relapse Prevention Therapy   Harden Mo, LCSW

## 2023-06-09 NOTE — Progress Notes (Addendum)
Notified by RN that patient has passed away. She was made DNR/DNI previously by the attending with no escalation of care per family wishes.  Patient was pronounced dead at 020:03AM.  Patient's family at the bedside at the time of patient's passing.  Notified the attending Dr. Everlene Farrier  who will complete the death summary/death certificate.  Webb Silversmith, DNP, CCRN, FNP-C, AGACNP-BC Acute Care & Family Nurse Practitioner  [facility]  See Amion for personal pager PCCM on call pager [phone] until 7 am

## 2023-06-09 NOTE — Progress Notes (Signed)
Pt monitor alarmed for asystole, this RN to bedside, pt has heart rate on monitor in 20s, art line no waveform. Pt went asystole at 0203 am with son and husband at bedside. Pt pronounced by Webb Silversmith NP.

## 2023-06-09 NOTE — Anesthesia Postprocedure Evaluation (Signed)
Anesthesia Post Note  Patient: Debra [name]  Procedure(s) Performed: IRRIGATION AND DEBRIDEMENT ABDOMEN- abdominal wash out (Abdomen) APPLICATION OF WOUND VAC  Anesthesia Type: General Anesthetic complications: no Comments: Patient deceased   No notable events documented.   Last Vitals:  Vitals:   06/04/2023 0000 05/24/2023 0100  BP:    Pulse: 65 62  Resp: (!) 24 15  Temp: (!) 36.3 C   SpO2: (!) 72% (!) 70%    Last Pain:  Vitals:   05/24/2023 1344  TempSrc: Oral  PainSc:                  [name]

## 2023-06-09 NOTE — Progress Notes (Signed)
Paged for pt death. Family shared no needs for chaplain services at this time. Family en route to visit one last time, please contact us if needs arise

## 2023-06-09 NOTE — Discharge Summary (Signed)
Patient ID: Debra [name] MRN: [id number] DOB/AGE: December 15, 1965 57 y.o.  Admit date: 02/11/2022 Discharge date: 05/16/2023   Discharge Diagnoses:  Principal Problem:   Enterocutaneous fistula Active Problems:   S/P hernia repair   Closed wedge compression fracture of first lumbar vertebra (HCC)   Recurrent ventral hernia   Arm swelling   Acute respiratory failure with hypoxia (HCC)   Acute metabolic encephalopathy   HCAP (healthcare-associated pneumonia)   Acute pulmonary edema (HCC)   Procedures: 02/13/2022 resection of colorectal metastasis intra-abdominally with complex repair of incisional hernia with bilateral myofascial flaps, with Phasix Mesh  02/12/2022 . Laparotomy with repair of bowel perforation, excision of old mesh and placement of Phasix for bridging  12/30/22 Insertion of port-a-cath  05/03/2023 Laparotomy with Enterocutaneous fistula takedown , small bowel resection, repair of enterotomies and anterior release and repair or recurrent ventral hernia and wound vac placement  05/14/2023 laparotomy Excision of mesh, Small bowel resection x 2,  double barrel ileostomy, Hartmann's procedure, repair of colotomy and wound vac placement  05/17/2023 ABdominal washout repair of enterotomy and wound vac placement   Hospital Course:  Debra [name] was a 57 year old female with history of sigmoid cancer that underwent an open sigmoid colectomy on Jan 30 2020 by Dr. 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 SHe did develop a wound infection at that time and developed an incisional ventral hernia. She did require drain placement  and prolonged antibiotics as well She underwent adjuvant chemotherapy. She  also Underwent further PET/CT showing evidence of hyper active mass on the anterior pelvis next to the anastomosis.  Interventional radiology initially was consulted and they deemed that the mass was not in a  good location and they  had no good window for percutaneous biopsy.  There was a concern for metastatic disease, Case was discussed with oncology and joint decision was made to proceed with metastasectomy with repair of recurrent ventral hernia.  SHe did have a baseline anemia and thrombocytopenia(likely as a result of chemotherapy) also DM and morbid obesity. Initial procedure during this hospitalization was on 02/25/2022 and underwent excision of metastatic mass intraperitoneal, with repair of recurrent incisional hernia and placement of phasic's mesh.  She did have a very hostile abdomen with extensive adhesions.  Postoperative #2 she had hypotension as well as enteric fluid coming from one of the drains.  There was a concern of bowel perforation and she was reexplored.  On 03/04/2022 she underwent exploratory laparotomy there was evidence of a bowel perforation located terminal ileum and this was repair.  This case was a very complex due to adhesions and hostile abdomen. She recovered reasonably well in the next few days  02/23/2022 Dr. Aleen Campi while rounding found some enteric output from one of the drains.  It was determined that she did have a fistula enterocutaneous.  We decided to manage this with IV antibiotics n.p.o. and NG tube and octreotide.  He did respond to to medical management with above measures. She became TPN dependent and we had significant issues with access.  Did have significant PICC lines that were changed from arch warm and was a port that I needed to removed Dec 30, 2022 she had to had a right IJ port placed due to PICC line associated DVT.  Eventually this became infected and had to be removed on July 2024  She hd a hospital stay of  over 14 months  after initial surgery.  I did ask specifically [facility] and  other tertiary centers for transfer of this patient.  Unfortunately I did not receive any approval for transferring institutions as they were at capacity and there was not a willing  surgeon at a tertiary facility to accept her.  The patient had socio economic challenges affecting her access to care ( lack of insurance). I do think this play a role in disposition as we could not arrange home TPN and home health. For  This reason the patient to stay in the hospital close to 15 months more .  I had a significant discussion with the patient and the family regarding options.  Patient was adamant that she did not want to live with an enterocutaneous fistula and she wanted me to attempt to reverse this fistula.  I did explain to her that this was a major surgery and she had multiple risk factors with a very hostile abdomen. I Did however listened to her concerns.  Unfortunately this was a very difficult position and we all knew this was a very high risk surgery ( surgeons, family and patient).  After multiple discussions with the patient and the family we finally decided to proceed with operative intervention. 05/09/2023 we proceeded with exploratory laparotomy and takedown of the fistula with a small bowel resection with repair of abdominal wall defect with 2 meshes phasic's ST and regular phasic's as well as a wound VAC.  Please note that this was an extremely difficult case taking 12 hours of total operative time.  Dr. Aleen Campi who is my senior partner was with me due to the complexity of the case.  This was definitely a 2 surgeon case.  Usually her postoperative period was reasonable she went to the ICU as expected given her major issues and massive surgical intervention.  Did keep her n.p.o. with an NG tube.  Once again about 7 days out from surgery she started to have enteric fluid from her drains.  Again placed on antibiotics return back to the ICU and we were hoping to begin this conservatively.  Initially this approach worked however on 05/13/2023 the patient became hypotensive she was fluid resuscitated and we started pressors.  She did have a CT scan showing extravasation of contrast that we  knew.  Given that this was extremely complex I did not want to perform another major intervention in the middle of the night.  I waited for the expert opinions of my senior partners as well as another Special educational needs teacher in the community.  We came to an agreement that given that her clinical status was deteriorating we needed to go back and explore her. I asked Dr. Hazle Quant on 06/01/2023 for his help.  We perform a reopening of a laparotomy and found multiple bowel injuries both in the small bowel and large bowel.  There was also evidence of partial mesh erosion into the bowel.  This was again extremely complex and the anatomy was severely distorted.  We were able to resect the anastomosis also resect another piece of bowel repair some of the enterotomies and perform a Hartman's procedure with an ileostomy in a double barrel fashion.  She was critically ill and remained intubated and in the ICU. Once I saw this intraoperative findings I was very disheartened.  I also knew that she was not going to heal anything.  We temporize this with an abdominal wound VAC and plan for a washout 3 days later.  She continued to remain critically ill.  On 05/21/2023 I took her for an abdominal  washout.  Once again there was evidence of some chronic ischemic changes in the bowel with an enterotomy proximal to the diverting loop.  Please note that this enterotomy was new.  More importantly the patient now was in multiorgan failure.  She was severely hypoxemic and was not tolerating supine position.  Actually had to do her last surgery in the hospital bed and we could not lay her flat due to severe hypoxic issues.  I did my best and place an ABThera wound VAC with repair of enterotomy. After this final surgery I had a conversation with the family.  At this time she was already on Levophed and vasopressin with maximal respiratory support on the ventilator with an FiO2 of 100%.  She was still having hypoxic issues.  We had a 5 at this time  I felt that her care was futile.  The family decided not to escalate her care and made her DNR.  Continue to the right deteriorated that night on on June 10, 2023 she died.  He was pronounced death by Webb Silversmith NP.  Husband and son were at the bedside.   Disposition: Discharge disposition: 20-Expired             Sterling Big, MD FACS

## 2023-06-09 DEATH — deceased

## 2023-07-12 IMAGING — CR DG ABDOMEN ACUTE W/ 1V CHEST
4 series · 4 of 4 positions shown · non-contrast
Comparison: February 15, 2022.

CLINICAL DATA: Postoperative ileus.

EXAM:
DG ABDOMEN ACUTE WITH 1 VIEW CHEST

[chest ap]
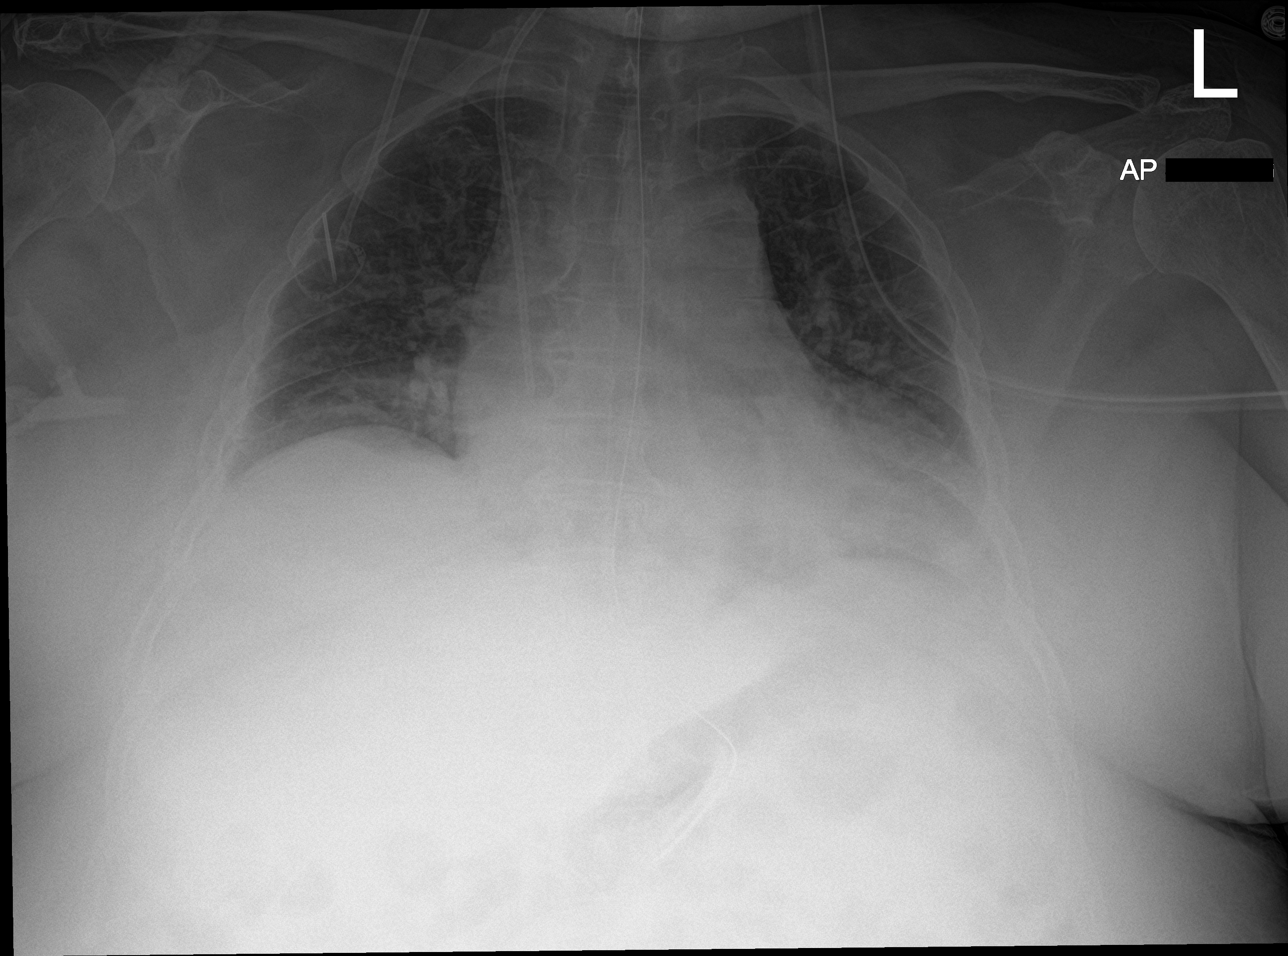

[abdomen erect]
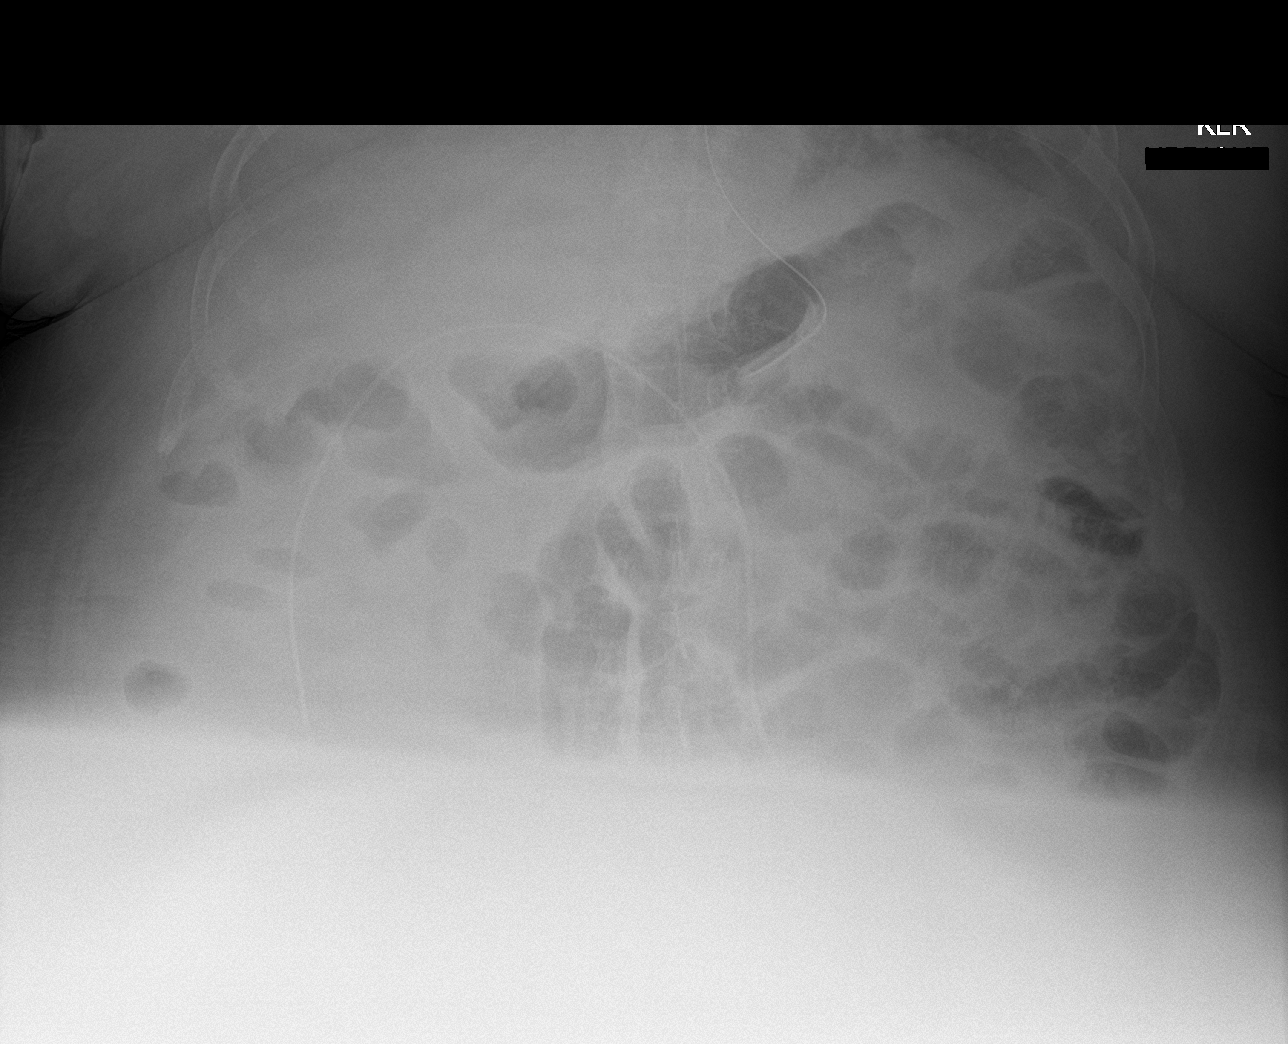

[abdomen supine (1 of 2)]
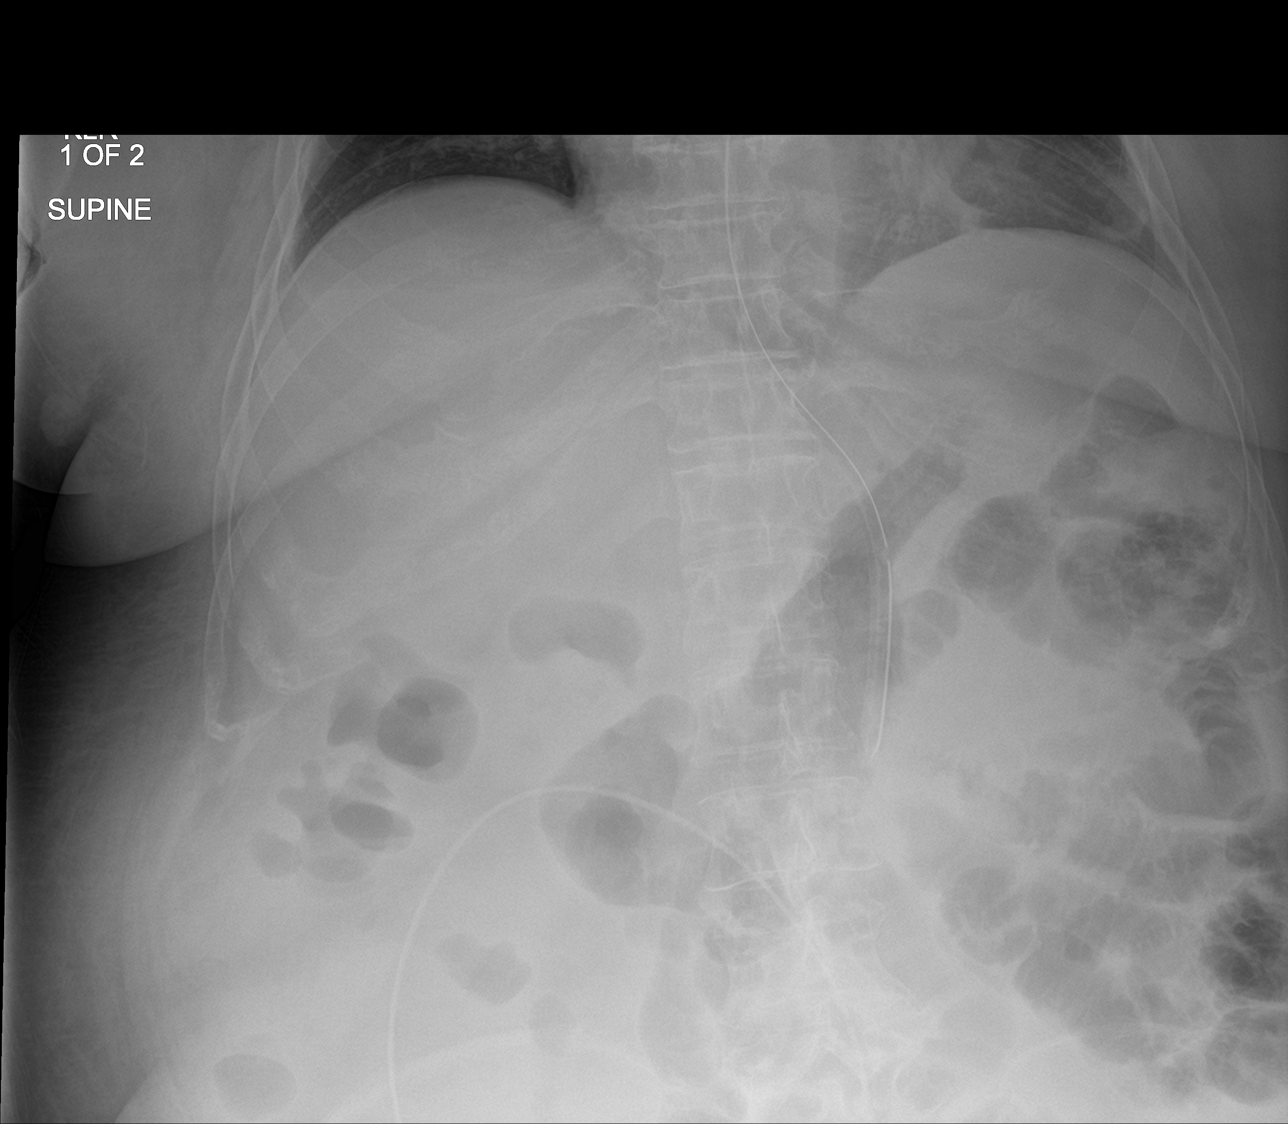

[abdomen supine (2 of 2)]
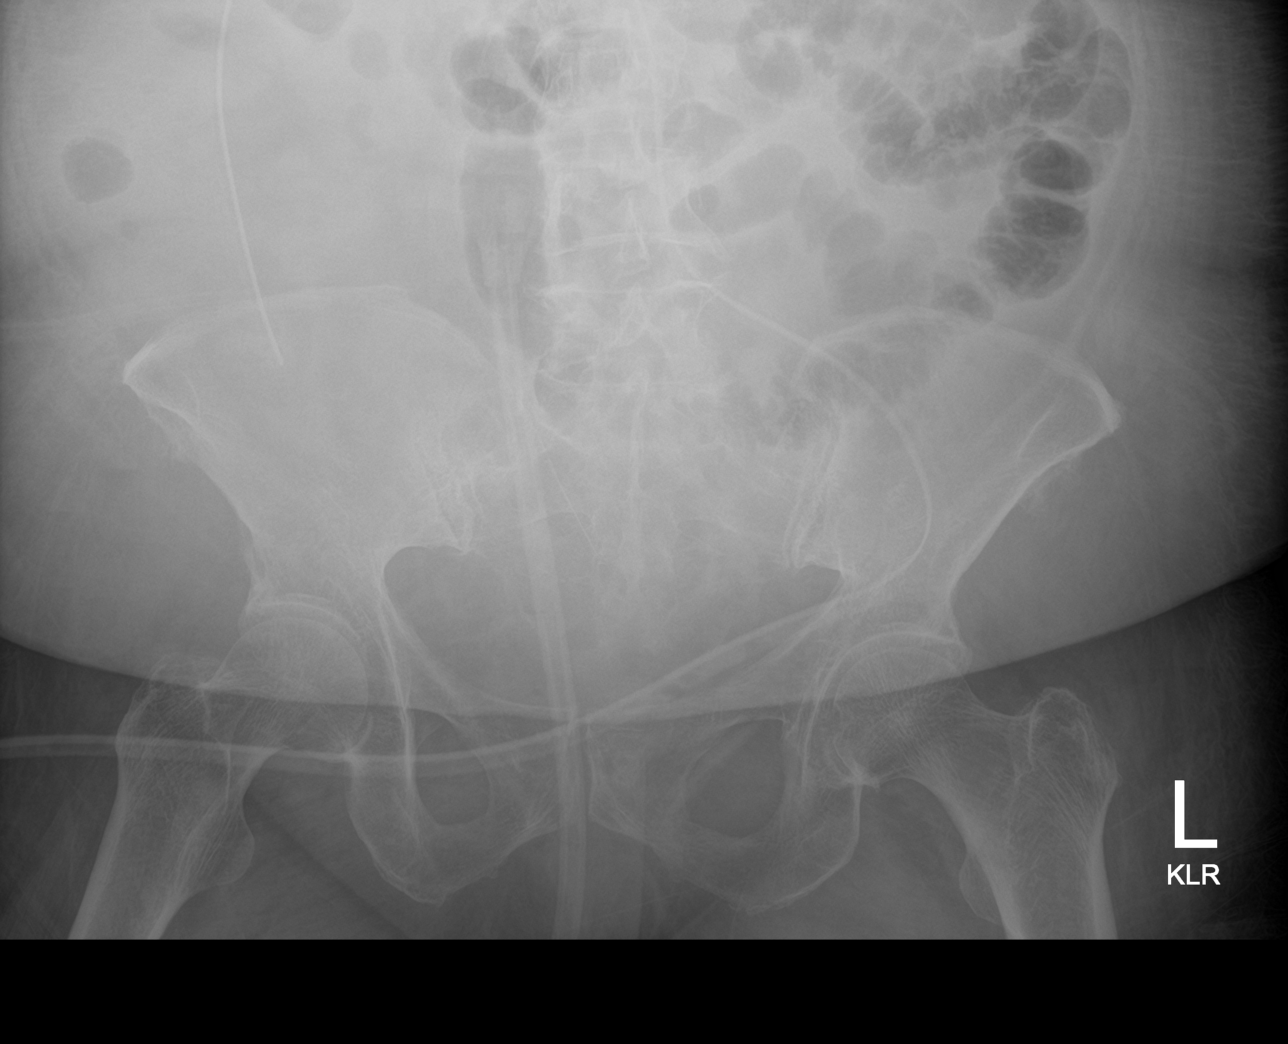

[4 of 4 positions shown; findings below may reference images not displayed]

FINDINGS: Distal tip of nasogastric tube is seen in expected position of the
stomach. No significant bowel dilatation is noted. Mild cardiomegaly
is noted. Mild bibasilar subsegmental atelectasis is noted. Right
internal jugular Port-A-Cath is unchanged in position.
IMPRESSION: No significant bowel dilatation is noted. Mild bibasilar
subsegmental atelectasis is noted.

## 2024-04-06 ENCOUNTER — Encounter: Payer: Self-pay | Admitting: Surgery
